# Patient Record
Sex: Female | Born: 1975
Health system: Southern US, Community
[De-identification: ages and names within clinical notes are randomized; demographics above are authoritative.]

## PROBLEM LIST (undated history)

## (undated) DIAGNOSIS — J45909 Unspecified asthma, uncomplicated: Secondary | ICD-10-CM

## (undated) DIAGNOSIS — G4733 Obstructive sleep apnea (adult) (pediatric): Secondary | ICD-10-CM

## (undated) DIAGNOSIS — I272 Pulmonary hypertension, unspecified: Secondary | ICD-10-CM

## (undated) DIAGNOSIS — N926 Irregular menstruation, unspecified: Secondary | ICD-10-CM

## (undated) DIAGNOSIS — R590 Localized enlarged lymph nodes: Secondary | ICD-10-CM

## (undated) DIAGNOSIS — Z8709 Personal history of other diseases of the respiratory system: Secondary | ICD-10-CM

## (undated) DIAGNOSIS — I82629 Acute embolism and thrombosis of deep veins of unspecified upper extremity: Secondary | ICD-10-CM

## (undated) DIAGNOSIS — K7460 Unspecified cirrhosis of liver: Secondary | ICD-10-CM

## (undated) DIAGNOSIS — I50812 Chronic right heart failure: Secondary | ICD-10-CM

## (undated) DIAGNOSIS — J449 Chronic obstructive pulmonary disease, unspecified: Secondary | ICD-10-CM

## (undated) DIAGNOSIS — E119 Type 2 diabetes mellitus without complications: Secondary | ICD-10-CM

## (undated) DIAGNOSIS — R0602 Shortness of breath: Secondary | ICD-10-CM

## (undated) DIAGNOSIS — F1021 Alcohol dependence, in remission: Secondary | ICD-10-CM

## (undated) DIAGNOSIS — G43909 Migraine, unspecified, not intractable, without status migrainosus: Secondary | ICD-10-CM

## (undated) DIAGNOSIS — K056 Periodontal disease, unspecified: Secondary | ICD-10-CM

## (undated) DIAGNOSIS — J189 Pneumonia, unspecified organism: Secondary | ICD-10-CM

## (undated) DIAGNOSIS — I2699 Other pulmonary embolism without acute cor pulmonale: Secondary | ICD-10-CM

## (undated) DIAGNOSIS — Z9981 Dependence on supplemental oxygen: Secondary | ICD-10-CM

## (undated) HISTORY — DX: Pulmonary hypertension, unspecified: I27.20

## (undated) HISTORY — DX: Alcohol dependence, in remission: F10.21

## (undated) HISTORY — DX: Unspecified cirrhosis of liver: K74.60

## (undated) HISTORY — DX: Localized enlarged lymph nodes: R59.0

---
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
## 2011-01-30 ENCOUNTER — Inpatient Hospital Stay: Payer: Self-pay | Admitting: Internal Medicine

## 2011-06-15 ENCOUNTER — Inpatient Hospital Stay (INDEPENDENT_AMBULATORY_CARE_PROVIDER_SITE_OTHER)
Admission: RE | Admit: 2011-06-15 | Discharge: 2011-06-15 | Disposition: A | Discharge status: Home / Self Care | Payer: Self-pay | Source: Ambulatory Visit | Attending: Emergency Medicine | Admitting: Emergency Medicine

## 2011-06-15 DIAGNOSIS — J449 Chronic obstructive pulmonary disease, unspecified: Secondary | ICD-10-CM

## 2012-01-24 ENCOUNTER — Emergency Department (INDEPENDENT_AMBULATORY_CARE_PROVIDER_SITE_OTHER): Payer: 59

## 2012-01-24 ENCOUNTER — Encounter (HOSPITAL_COMMUNITY): Payer: Self-pay

## 2012-01-24 ENCOUNTER — Emergency Department (INDEPENDENT_AMBULATORY_CARE_PROVIDER_SITE_OTHER)
Admission: EM | Admit: 2012-01-24 | Discharge: 2012-01-24 | Disposition: A | Discharge status: Home / Self Care | Payer: 59 | Source: Home / Self Care | Attending: Emergency Medicine | Admitting: Emergency Medicine

## 2012-01-24 DIAGNOSIS — R609 Edema, unspecified: Secondary | ICD-10-CM

## 2012-01-24 DIAGNOSIS — N390 Urinary tract infection, site not specified: Secondary | ICD-10-CM

## 2012-01-24 HISTORY — DX: Chronic obstructive pulmonary disease, unspecified: J44.9

## 2012-01-24 LAB — POCT URINALYSIS DIP (DEVICE)
Ketones, ur: NEGATIVE mg/dL
Protein, ur: 100 mg/dL — AB
Specific Gravity, Urine: 1.02 (ref 1.005–1.030)
pH: 6.5 (ref 5.0–8.0)

## 2012-01-24 LAB — CBC
HCT: 48.6 % — ABNORMAL HIGH (ref 36.0–46.0)
Hemoglobin: 15.6 g/dL — ABNORMAL HIGH (ref 12.0–15.0)
MCHC: 32.1 g/dL (ref 30.0–36.0)
RBC: 4.84 MIL/uL (ref 3.87–5.11)

## 2012-01-24 LAB — POCT I-STAT, CHEM 8
BUN: 7 mg/dL (ref 6–23)
Creatinine, Ser: 0.8 mg/dL (ref 0.50–1.10)
Glucose, Bld: 100 mg/dL — ABNORMAL HIGH (ref 70–99)
Hemoglobin: 18 g/dL — ABNORMAL HIGH (ref 12.0–15.0)
Potassium: 3.9 mEq/L (ref 3.5–5.1)
Sodium: 140 mEq/L (ref 135–145)

## 2012-01-24 MED ORDER — NITROFURANTOIN MONOHYD MACRO 100 MG PO CAPS: 100.0000 mg | ORAL_CAPSULE | Freq: Two times a day (BID) | ORAL | Status: AC

## 2012-01-24 MED ORDER — FUROSEMIDE 20 MG PO TABS: 20.0000 mg | ORAL_TABLET | Freq: Two times a day (BID) | ORAL | Status: DC

## 2012-01-24 NOTE — ED Provider Notes (Addendum)
History     CSN: [id number]  Arrival date & time 01/24/12  1037   First MD Initiated Contact with Patient 01/24/12 1037      Chief Complaint  Patient presents with  . Generalized Body Aches    (Consider location/radiation/quality/duration/timing/severity/associated sxs/prior treatment) HPI Comments: Patient complains of generalized swelling-states that her face, extremities, abdomen become swollen over the past week. Patient reports spontaneous atraumatic ecchymosis on her abdomen. Also reports increasing shortness of breath, wheezing consistent with previous episodes of COPD, has been using her albuterol, but is now complaining of coughing up "green stuff" in the morning. No nausea, vomiting, fevers. No orthopnea, PND, nocturia. No unintentional weight gain. No abdominal pain. No urinary complaints, hematuria, foamy urine. No history of cirrhosis, liver disease, kidney disease. Patient is not on any long-term steroids. No other recent change in medications.  Patient is a 36 y.o. female presenting with shortness of breath. The history is provided by the patient. No language interpreter was used.  Shortness of Breath  The current episode started more than 1 week ago. The onset was gradual. The problem has been gradually worsening. The symptoms are relieved by nothing. The symptoms are aggravated by nothing. Associated symptoms include shortness of breath and wheezing. Pertinent negatives include no chest pain, no orthopnea and no fever. She has had no prior steroid use.    Past Medical History  Diagnosis Date  . COPD (chronic obstructive pulmonary disease)     Past Surgical History  Procedure Date  . Cardiac surgery     History reviewed. No pertinent family history.  History  Substance Use Topics  . Smoking status: Current Everyday Smoker  . Smokeless tobacco: Not on file  . Alcohol Use:     OB History    Grav Para Term Preterm Abortions TAB SAB Ect Mult Living                    Review of Systems  Constitutional: Positive for fatigue. Negative for fever.  HENT: Positive for facial swelling.   Respiratory: Positive for chest tightness, shortness of breath and wheezing.   Cardiovascular: Positive for leg swelling. Negative for chest pain and orthopnea.  Gastrointestinal: Negative for nausea, vomiting, diarrhea and constipation.  Musculoskeletal: Positive for joint swelling.  Skin: Negative for color change.    Allergies  Review of patient's allergies indicates no known allergies.  Home Medications   Current Outpatient Rx  Name Route Sig Dispense Refill  . ALBUTEROL SULFATE HFA 108 (90 BASE) MCG/ACT IN AERS Inhalation Inhale 2 puffs into the lungs every 6 (six) hours as needed.    Marland Kitchen FLUTICASONE PROPIONATE  HFA 110 MCG/ACT IN AERO Inhalation Inhale 1 puff into the lungs 2 (two) times daily.    Marland Kitchen FLUTICASONE-SALMETEROL 115-21 MCG/ACT IN AERO Inhalation Inhale 2 puffs into the lungs 2 (two) times daily.    . FUROSEMIDE 20 MG PO TABS Oral Take 1 tablet (20 mg total) by mouth 2 (two) times daily. 20 tablet 0  . NITROFURANTOIN MONOHYD MACRO 100 MG PO CAPS Oral Take 1 capsule (100 mg total) by mouth 2 (two) times daily. 10 capsule 0    BP 124/93  Resp 17  SpO2 96%  LMP 01/17/2012  Physical Exam  Nursing note and vitals reviewed. Constitutional: She is oriented to person, place, and time. She appears well-developed and well-nourished.  HENT:  Head: Normocephalic and atraumatic.       Round face, mild periorbital puffiness.  Eyes: Conjunctivae and  EOM are normal.  Neck: Normal range of motion. No JVD present.  Cardiovascular: Normal rate, regular rhythm, normal heart sounds and intact distal pulses.   No murmur heard. Pulmonary/Chest: Effort normal. No respiratory distress. She has wheezes. She has no rales. She exhibits no tenderness.       Wheezing left upper lobe.  Abdominal: Soft. Bowel sounds are normal. She exhibits no distension and no fluid  wave. There is no hepatomegaly. There is no tenderness. There is no rebound, no guarding and no CVA tenderness.       Multiple ecchymosis over anterior abdomen  Musculoskeletal: Normal range of motion. She exhibits edema. She exhibits no tenderness.       Trace edema to knees bilaterally.  Neurological: She is alert and oriented to person, place, and time.  Skin: Skin is warm and dry.  Psychiatric: She has a normal mood and affect. Her behavior is normal. Judgment and thought content normal.    ED Course  Procedures (including critical care time)  Dg Chest 2 View  01/24/2012  *RADIOLOGY REPORT*  Clinical Data: Diffuse swelling.  CHEST - 2 VIEW  Comparison: None.  Findings: Trachea is midline.  Heart size normal.  Mild basilar predominant interstitial prominence.  No pleural fluid.  IMPRESSION: Mild basilar predominant interstitial prominence is indicative of edema.  Original Report Authenticated By: Reyes Ivan, M.D.     1. Edema   2. UTI (lower urinary tract infection)    Results for orders placed during the hospital encounter of 01/24/12  CBC      Component Value Range   WBC 9.8  4.0 - 10.5 (K/uL)   RBC 4.84  3.87 - 5.11 (MIL/uL)   Hemoglobin 15.6 (*) 12.0 - 15.0 (g/dL)   HCT 16.1 (*) 09.6 - 46.0 (%)   MCV 100.4 (*) 78.0 - 100.0 (fL)   MCH 32.2  26.0 - 34.0 (pg)   MCHC 32.1  30.0 - 36.0 (g/dL)   RDW 04.5  40.9 - 81.1 (%)   Platelets 198  150 - 400 (K/uL)  POCT I-STAT, CHEM 8      Component Value Range   Sodium 140  135 - 145 (mEq/L)   Potassium 3.9  3.5 - 5.1 (mEq/L)   Chloride 95 (*) 96 - 112 (mEq/L)   BUN 7  6 - 23 (mg/dL)   Creatinine, Ser 9.14  0.50 - 1.10 (mg/dL)   Glucose, Bld 782 (*) 70 - 99 (mg/dL)   Calcium, Ion 9.56  2.13 - 1.32 (mmol/L)   TCO2 35  0 - 100 (mmol/L)   Hemoglobin 18.0 (*) 12.0 - 15.0 (g/dL)   HCT 08.6 (*) 57.8 - 46.0 (%)  POCT URINALYSIS DIP (DEVICE)      Component Value Range   Glucose, UA NEGATIVE  NEGATIVE (mg/dL)   Bilirubin Urine  SMALL (*) NEGATIVE    Ketones, ur NEGATIVE  NEGATIVE (mg/dL)   Specific Gravity, Urine 1.020  1.005 - 1.030    Hgb urine dipstick SMALL (*) NEGATIVE    pH 6.5  5.0 - 8.0    Protein, ur 100 (*) NEGATIVE (mg/dL)   Urobilinogen, UA 1.0  0.0 - 1.0 (mg/dL)   Nitrite POSITIVE (*) NEGATIVE    Leukocytes, UA NEGATIVE  NEGATIVE   POCT PREGNANCY, URINE      Component Value Range   Preg Test, Ur NEGATIVE  NEGATIVE    Dg Chest 2 View  01/24/2012  *RADIOLOGY REPORT*  Clinical Data: Diffuse swelling.  CHEST -  2 VIEW  Comparison: None.  Findings: Trachea is midline.  Heart size normal.  Mild basilar predominant interstitial prominence.  No pleural fluid.  IMPRESSION: Mild basilar predominant interstitial prominence is indicative of edema.  Original Report Authenticated By: Reyes Ivan, M.D.     Imaging reviewed by myself. Report per radiologist.   MDM  Labs, imaging reviewed. Patient has proteinuria, nitrates in urine. Patient has no vaginal complaints. No anemia, or thrombocytopenia suggesting hemolytic process. Will send her home with low-dose Lasix for her symptoms, and have her followup with a primary care physician for further evaluation. Sending her home with a copy of her labs. No evidence of hepatitis at this time. Discussed all results and plan with patient. Patient agrees with plan.  Luiz Blare, MD 01/24/12 1712  Luiz Blare, MD 01/24/12 [phone]

## 2012-01-24 NOTE — ED Notes (Addendum)
C/o has been having a problem w her breathing for past week or so, accompanied by generalized swelling ; NAD at present; states the pro-aire MDI works better that the albuterol (pro air not covered by her insurance)    C/o she is sore in chest  From coughing, and has green sputum in AM

## 2012-01-24 NOTE — Discharge Instructions (Signed)
Decrease her salt intake. This will help with the generalized swelling. He was need to followup with a physician in the next week to determine the cause of your edema. As you get some of the fluid out of your lungs, you should start feeling better. Continue your albuterol inhaler every 4-6 hours as needed for coughing, wheezing, shortness of breath.  Edema Edema is an abnormal build-up of fluids in tissues. Because this is partly dependent on gravity (water flows to the lowest place), it is more common in the leg sand thighs (lower extremities). It is also common in the looser tissues, like around the eyes. Painless swelling of the feet and ankles is common and increases as a person ages. It may affect both legs and may include the calves or even thighs. When squeezed, the fluid may move out of the affected area and may leave a dent for a few moments. CAUSES   Prolonged standing or sitting in one place for extended periods of time. Movement helps pump tissue fluid into the veins, and absence of movement prevents this, resulting in edema.   Varicose veins. The valves in the veins do not work as well as they should. This causes fluid to leak into the tissues.   Fluid and salt overload.   Injury, burn, or surgery to the leg, ankle, or foot, may damage veins and allow fluid to leak out.   Sunburn damages vessels. Leaky vessels allow fluid to go out into the sunburned tissues.   Allergies (from insect bites or stings, medications or chemicals) cause swelling by allowing vessels to become leaky.   Protein in the blood helps keep fluid in your vessels. Low protein, as in malnutrition, allows fluid to leak out.   Hormonal changes, including pregnancy and menstruation, cause fluid retention. This fluid may leak out of vessels and cause edema.   Medications that cause fluid retention. Examples are sex hormones, blood pressure medications, steroid treatment, or anti-depressants.   Some illnesses cause  edema, especially heart failure, kidney disease, or liver disease.   Surgery that cuts veins or lymph nodes, such as surgery done for the heart or for breast cancer, may result in edema.  DIAGNOSIS  Your caregiver is usually easily able to determine what is causing your swelling (edema) by simply asking what is wrong (getting a history) and examining you (doing a physical). Sometimes x-rays, EKG (electrocardiogram or heart tracing), and blood work may be done to evaluate for underlying medical illness. TREATMENT  General treatment includes:  Leg elevation (or elevation of the affected body part).   Restriction of fluid intake.   Prevention of fluid overload.   Compression of the affected body part. Compression with elastic bandages or support stockings squeezes the tissues, preventing fluid from entering and forcing it back into the blood vessels.   Diuretics (also called water pills or fluid pills) pull fluid out of your body in the form of increased urination. These are effective in reducing the swelling, but can have side effects and must be used only under your caregiver's supervision. Diuretics are appropriate only for some types of edema.  The specific treatment can be directed at any underlying causes discovered. Heart, liver, or kidney disease should be treated appropriately. HOME CARE INSTRUCTIONS   Elevate the legs (or affected body part) above the level of the heart, while lying down.   Avoid sitting or standing still for prolonged periods of time.   Avoid putting anything directly under the knees when lying  down, and do not wear constricting clothing or garters on the upper legs.   Exercising the legs causes the fluid to work back into the veins and lymphatic channels. This may help the swelling go down.   The pressure applied by elastic bandages or support stockings can help reduce ankle swelling.   A low-salt diet may help reduce fluid retention and decrease the ankle  swelling.   Take any medications exactly as prescribed.  SEEK MEDICAL CARE IF:  Your edema is not responding to recommended treatments. SEEK IMMEDIATE MEDICAL CARE IF:   You develop shortness of breath or chest pain.   You cannot breathe when you lay down; or if, while lying down, you have to get up and go to the window to get your breath.   You are having increasing swelling without relief from treatment.   You develop a fever over 102 F (38.9 C).   You develop pain or redness in the areas that are swollen.   Tell your caregiver right away if you have gained 3 lb/1.4 kg in 1 day or 5 lb/2.3 kg in a week.  MAKE SURE YOU:   Understand these instructions.   Will watch your condition.   Will get help right away if you are not doing well or get worse.  Document Released: 11/22/2005 Document Revised: 08/04/2011 Document Reviewed: 07/10/2008 [facility] Patient Information 2012 [place], [place].

## 2012-01-26 ENCOUNTER — Ambulatory Visit (INDEPENDENT_AMBULATORY_CARE_PROVIDER_SITE_OTHER): Payer: 59 | Admitting: Family Medicine

## 2012-01-26 VITALS — BP 132/87 | HR 93 | Temp 98.1°F | Resp 16 | Ht 62.0 in | Wt 182.0 lb

## 2012-01-26 DIAGNOSIS — J4489 Other specified chronic obstructive pulmonary disease: Secondary | ICD-10-CM

## 2012-01-26 DIAGNOSIS — R0789 Other chest pain: Secondary | ICD-10-CM

## 2012-01-26 DIAGNOSIS — J449 Chronic obstructive pulmonary disease, unspecified: Secondary | ICD-10-CM

## 2012-01-26 DIAGNOSIS — R609 Edema, unspecified: Secondary | ICD-10-CM

## 2012-01-26 MED ORDER — IBUPROFEN 600 MG PO TABS: 600.0000 mg | ORAL_TABLET | Freq: Three times a day (TID) | ORAL | Status: AC | PRN

## 2012-01-26 MED ORDER — TORSEMIDE 20 MG PO TABS: 20.0000 mg | ORAL_TABLET | Freq: Every day | ORAL | Status: DC

## 2012-01-26 NOTE — Patient Instructions (Signed)
Come back on Friday noon    Smoking Cessation, Tips for Success YOU CAN QUIT SMOKING If you are ready to quit smoking, congratulations! You have chosen to help yourself be healthier. Cigarettes bring nicotine, tar, carbon monoxide, and other irritants into your body. Your lungs, heart, and blood vessels will be able to work better without these poisons. There are many different ways to quit smoking. Nicotine gum, nicotine patches, a nicotine inhaler, or nicotine nasal spray can help with physical craving. Hypnosis, support groups, and medicines help break the habit of smoking. Here are some tips to help you quit for good.  Throw away all cigarettes.   Clean and remove all ashtrays from your home, work, and car.   On a card, write down your reasons for quitting. Carry the card with you and read it when you get the urge to smoke.   Cleanse your body of nicotine. Drink enough water and fluids to keep your urine clear or pale yellow. Do this after quitting to flush the nicotine from your body.   Learn to predict your moods. Do not let a bad situation be your excuse to have a cigarette. Some situations in your life might tempt you into wanting a cigarette.   Never have "just one" cigarette. It leads to wanting another and another. Remind yourself of your decision to quit.   Change habits associated with smoking. If you smoked while driving or when feeling stressed, try other activities to replace smoking. Stand up when drinking your coffee. Brush your teeth after eating. Sit in a different chair when you read the paper. Avoid alcohol while trying to quit, and try to drink fewer caffeinated beverages. Alcohol and caffeine may urge you to smoke.   Avoid foods and drinks that can trigger a desire to smoke, such as sugary or spicy foods and alcohol.   Ask people who smoke not to smoke around you.   Have something planned to do right after eating or having a cup of coffee. Take a walk or exercise to  perk you up. This will help to keep you from overeating.   Try a relaxation exercise to calm you down and decrease your stress. Remember, you may be tense and nervous for the first 2 weeks after you quit, but this will pass.   Find new activities to keep your hands busy. Play with a pen, coin, or rubber band. Doodle or draw things on paper.   Brush your teeth right after eating. This will help cut down on the craving for the taste of tobacco after meals. You can try mouthwash, too.   Use oral substitutes, such as lemon drops, carrots, a cinnamon stick, or chewing gum, in place of cigarettes. Keep them handy so they are available when you have the urge to smoke.   When you have the urge to smoke, try deep breathing.   Designate your home as a nonsmoking area.   If you are a heavy smoker, ask your caregiver about a prescription for nicotine chewing gum. It can ease your withdrawal from nicotine.   Reward yourself. Set aside the cigarette money you save and buy yourself something nice.   Look for support from others. Join a support group or smoking cessation program. Ask someone at home or at work to help you with your plan to quit smoking.   Always ask yourself, "Do I need this cigarette or is this just a reflex?" Tell yourself, "Today, I choose not to smoke," or "  I do not want to smoke." You are reminding yourself of your decision to quit, even if you do smoke a cigarette.  HOW WILL I FEEL WHEN I QUIT SMOKING?  The benefits of not smoking start within days of quitting.   You may have symptoms of withdrawal because your body is used to nicotine (the addictive substance in cigarettes). You may crave cigarettes, be irritable, feel very hungry, cough often, get headaches, or have difficulty concentrating.   The withdrawal symptoms are only temporary. They are strongest when you first quit but will go away within 10 to 14 days.   When withdrawal symptoms occur, stay in control. Think about  your reasons for quitting. Remind yourself that these are signs that your body is healing and getting used to being without cigarettes.   Remember that withdrawal symptoms are easier to treat than the major diseases that smoking can cause.   Even after the withdrawal is over, expect periodic urges to smoke. However, these cravings are generally short-lived and will go away whether you smoke or not. Do not smoke!   If you relapse and smoke again, do not lose hope. Most smokers quit 3 times before they are successful.   If you relapse, do not give up! Plan ahead and think about what you will do the next time you get the urge to smoke.  LIFE AS A NONSMOKER: MAKE IT FOR A MONTH, MAKE IT FOR LIFE Day 1: Hang this page where you will see it every day. Day 2: Get rid of all ashtrays, matches, and lighters. Day 3: Drink water. Breathe deeply between sips. Day 4: Avoid places with smoke-filled air, such as bars, clubs, or the smoking section of restaurants. Day 5: Keep track of how much money you save by not smoking. Day 6: Avoid boredom. Keep a good book with you or go to the movies. Day 7: Reward yourself! One week without smoking! Day 8: Make a dental appointment to get your teeth cleaned. Day 9: Decide how you will turn down a cigarette before it is offered to you. Day 10: Review your reasons for quitting. Day 11: Distract yourself. Stay active to keep your mind off smoking and to relieve tension. Take a walk, exercise, read a book, do a crossword puzzle, or try a new hobby. Day 12: Exercise. Get off the bus before your stop or use stairs instead of escalators. Day 13: Call on friends for support and encouragement. Day 14: Reward yourself! Two weeks without smoking! Day 15: Practice deep breathing exercises. Day 16: Bet a friend that you can stay a nonsmoker. Day 17: Ask to sit in nonsmoking sections of restaurants. Day 18: Hang up "No Smoking" signs. Day 19: Think of yourself as a  nonsmoker. Day 20: Each morning, tell yourself you will not smoke. Day 21: Reward yourself! Three weeks without smoking! Day 22: Think of smoking in negative ways. Remember how it stains your teeth, gives you bad breath, and leaves you short of breath. Day 23: Eat a nutritious breakfast. Day 24:Do not relive your days as a smoker. Day 25: Hold a pencil in your hand when talking on the telephone. Day 26: Tell all your friends you do not smoke. Day 27: Think about how much better food tastes. Day 28: Remember, one cigarette is one too many. Day 29: Take up a hobby that will keep your hands busy. Day 30: Congratulations! One month without smoking! Give yourself a big reward. Your caregiver can direct you  to community resources or hospitals for support, which may include:  Group support.   Education.   Hypnosis.   Subliminal therapy.  Document Released: 08/20/2004 Document Revised: 08/04/2011 Document Reviewed: 09/08/2009 [facility] Patient Information 2012 [place], [place].

## 2012-01-26 NOTE — ED Notes (Signed)
Pt. came into [facility] and asked who did we refer her? D/C instructions printed and given to pt. She asked for directions to [facility]-given.

## 2012-01-26 NOTE — Progress Notes (Signed)
This is a 36 yo woman with almost 40 pack yr hx of cigarettes, known COPD, and recurrent chest tightness and chronic asthma.  She presents with bloating, diffuse edema and chest tightness two days after a visit to [facility] where she was diagnosed with fluid overload and started on Lasix. Pt has a h/o childhood cardiac surgery, unknown type Pt was an alcoholic, dry x 1 year She works with a International aid/development worker.  Obj:  Mildly dyspneic, appears in chronically poor health with dysconjugate gaze and horrible dentition, overweight, and much older than stated age, wheezing  EOM:  Bilateral esophoria Oroph:  Diffuse caries, broken teeth; patent airway Neck:  Thick with no thyromegaly or discernible JVD Chest:  Diminished breath sounds Heart: distant, faint gallop Extrem:  Doughy, 1-2+ edema, ashen Skin:  Ecchymosis each upper quadrant of abdomen, abrasion RUQ from dog scratch  EKG: consistent with RVH and cor pulmonale  I told patient that continued smoking was suicidal.    A:  COPD with probable cor pulmonale and possible OSA.  She needs a careful pulmonary and cardiac workup and obviously needs to quit smoking.  Her teeth need fixing to prevent chronic aspiration or detritus.  P:  Referrals to cardiology and pulmonary Referral to dentistry Check TSH Medrol dospak Demadex

## 2012-01-27 ENCOUNTER — Telehealth: Payer: Self-pay

## 2012-01-27 LAB — TSH: TSH: 2.371 u[IU]/mL (ref 0.350–4.500)

## 2012-01-27 LAB — IRON AND TIBC
%SAT: 10 % — ABNORMAL LOW (ref 20–55)
Iron: 40 ug/dL — ABNORMAL LOW (ref 42–145)
TIBC: 412 ug/dL (ref 250–470)
UIBC: 372 ug/dL (ref 125–400)

## 2012-01-27 LAB — VITAMIN B12: Vitamin B-12: 560 pg/mL (ref 211–911)

## 2012-01-27 NOTE — Telephone Encounter (Signed)
Patient states clinical nurse contacted her and left message to call office. Please call patient

## 2012-01-28 ENCOUNTER — Ambulatory Visit (INDEPENDENT_AMBULATORY_CARE_PROVIDER_SITE_OTHER): Payer: 59 | Admitting: Family Medicine

## 2012-01-28 VITALS — BP 118/83 | HR 94 | Temp 97.9°F | Resp 16 | Ht 62.0 in | Wt 179.0 lb

## 2012-01-28 DIAGNOSIS — R609 Edema, unspecified: Secondary | ICD-10-CM

## 2012-01-28 DIAGNOSIS — J449 Chronic obstructive pulmonary disease, unspecified: Secondary | ICD-10-CM

## 2012-01-28 DIAGNOSIS — J40 Bronchitis, not specified as acute or chronic: Secondary | ICD-10-CM

## 2012-01-28 DIAGNOSIS — K029 Dental caries, unspecified: Secondary | ICD-10-CM | POA: Insufficient documentation

## 2012-01-28 DIAGNOSIS — Q249 Congenital malformation of heart, unspecified: Secondary | ICD-10-CM | POA: Insufficient documentation

## 2012-01-28 DIAGNOSIS — E669 Obesity, unspecified: Secondary | ICD-10-CM | POA: Insufficient documentation

## 2012-01-28 MED ORDER — MOMETASONE FURO-FORMOTEROL FUM 200-5 MCG/ACT IN AERO: 2.0000 | INHALATION_SPRAY | Freq: Two times a day (BID) | RESPIRATORY_TRACT | Status: DC

## 2012-01-28 NOTE — Progress Notes (Signed)
This is a 36 yo woman with almost 40 pack yr hx of cigarettes, known COPD, and recurrent chest tightness and chronic asthma. She is feeling less bloated having lost 4 pounds.  She is coughing green phlegm. No cigarettes in 16 hours. Pt has a h/o childhood cardiac surgery, unknown type  Pt was an alcoholic, dry x 1 year  She works with a International aid/development worker.  Obj: Mildly dyspneic, appears in chronically poor health with dysconjugate gaze and horrible dentition, overweight, and much older than stated age, wheezing  EOM: Bilateral esophoria  Oroph: Diffuse caries, broken teeth; patent airway  Neck: Thick with no thyromegaly or discernible JVD  Chest: Diminished breath sounds, wheezes Heart: distant, faint gallop  Extrem: Doughy, 1-2+ edema, ashen  Skin: Ecchymosis each upper quadrant of abdomen, abrasion RUQ from dog scratch   Results for orders placed in visit on 01/26/12  TSH      Component Value Range   TSH 2.371  0.350 - 4.500 (uIU/mL)  VITAMIN B12      Component Value Range   Vitamin B-12 560  211 - 911 (pg/mL)  IRON AND TIBC      Component Value Range   Iron 40 (*) 42 - 145 (ug/dL)   UIBC 841  324 - 401 (ug/dL)   TIBC 027  253 - 664 (ug/dL)   %SAT 10 (*) 20 - 55 (%)   A: COPD with probable cor pulmonale and possible OSA. She needs a careful pulmonary and cardiac workup and obviously needs to quit smoking. Her teeth need fixing to prevent chronic aspiration or detritus.   Low iron P: Referrals to cardiology and pulmonary pending Referral to dentistry pending Dr. Lawrence Marseilles card Iron qd Dulera 200 bid Demadex qod now Recheck 3 wks

## 2012-01-29 NOTE — Telephone Encounter (Signed)
Pt was seen in our office yesterday. Phone msg was taken 2 days ago

## 2012-02-16 ENCOUNTER — Institutional Professional Consult (permissible substitution): Payer: 59 | Admitting: Internal Medicine

## 2012-02-21 ENCOUNTER — Encounter: Payer: Self-pay | Admitting: *Deleted

## 2012-02-22 ENCOUNTER — Ambulatory Visit (INDEPENDENT_AMBULATORY_CARE_PROVIDER_SITE_OTHER): Payer: 59 | Admitting: Cardiology

## 2012-02-22 ENCOUNTER — Encounter: Payer: Self-pay | Admitting: Cardiology

## 2012-02-22 VITALS — BP 114/83 | HR 110 | Ht 62.0 in | Wt 187.0 lb

## 2012-02-22 DIAGNOSIS — Z720 Tobacco use: Secondary | ICD-10-CM | POA: Insufficient documentation

## 2012-02-22 DIAGNOSIS — J4489 Other specified chronic obstructive pulmonary disease: Secondary | ICD-10-CM

## 2012-02-22 DIAGNOSIS — R188 Other ascites: Secondary | ICD-10-CM

## 2012-02-22 DIAGNOSIS — J449 Chronic obstructive pulmonary disease, unspecified: Secondary | ICD-10-CM

## 2012-02-22 DIAGNOSIS — F17200 Nicotine dependence, unspecified, uncomplicated: Secondary | ICD-10-CM

## 2012-02-22 DIAGNOSIS — R0600 Dyspnea, unspecified: Secondary | ICD-10-CM | POA: Insufficient documentation

## 2012-02-22 DIAGNOSIS — R0602 Shortness of breath: Secondary | ICD-10-CM

## 2012-02-22 LAB — CBC WITH DIFFERENTIAL/PLATELET
Basophils Relative: 0.2 % (ref 0.0–3.0)
Eosinophils Absolute: 0.1 10*3/uL (ref 0.0–0.7)
HCT: 51.1 % — ABNORMAL HIGH (ref 36.0–46.0)
Hemoglobin: 16.5 g/dL — ABNORMAL HIGH (ref 12.0–15.0)
Lymphocytes Relative: 14.5 % (ref 12.0–46.0)
Lymphs Abs: 1.5 10*3/uL (ref 0.7–4.0)
MCHC: 32.3 g/dL (ref 30.0–36.0)
Neutro Abs: 7.7 10*3/uL (ref 1.4–7.7)
RBC: 5.28 Mil/uL — ABNORMAL HIGH (ref 3.87–5.11)

## 2012-02-22 LAB — BASIC METABOLIC PANEL
CO2: 39 mEq/L — ABNORMAL HIGH (ref 19–32)
Calcium: 8.6 mg/dL (ref 8.4–10.5)
Creatinine, Ser: 0.9 mg/dL (ref 0.4–1.2)
GFR: 74.11 mL/min (ref >60.00)
Sodium: 138 mEq/L (ref 135–145)

## 2012-02-22 LAB — HEPATIC FUNCTION PANEL
Alkaline Phosphatase: 79 U/L (ref 39–117)
Bilirubin, Direct: 0.2 mg/dL (ref 0.0–0.3)
Total Bilirubin: 1.2 mg/dL (ref 0.3–1.2)
Total Protein: 6.2 g/dL (ref 6.0–8.3)

## 2012-02-22 NOTE — Patient Instructions (Signed)
Your physician recommends that you schedule a follow-up appointment in: 4 WEEKS  Your physician has requested that you have an echocardiogram. Echocardiography is a painless test that uses sound waves to create images of your heart. It provides your doctor with information about the size and shape of your heart and how well your heart's chambers and valves are working. This procedure takes approximately one hour. There are no restrictions for this procedure.   ABDOMINAL ULTRASOUND FOR ASCITES   Your physician recommends that you return for lab work in: TODAY

## 2012-02-22 NOTE — Assessment & Plan Note (Signed)
Patient counseled on discontinuing. 

## 2012-02-22 NOTE — Progress Notes (Signed)
  HPI: A 36 year old female for evaluation of dyspnea and edema. The patient states she had heart and lung surgery as a child. This was done in [place]. None of those records are available. She does not think she had congenital heart disease. Over the past 5 weeks she has noticed progressive edema in her abdomen and lower extremities predominantly. There is question orthopnea but no PND. No chest pain or syncope. Note she does have a history of COPD. She also has a history of alcoholism. Chest x-ray in February 2013 showed mild basilar and residual prominence suggestive of edema. TSH in February of 2013 normal. Renal function normal. Hemoglobin 18. Because of her edema and dyspnea we were asked to evaluate.  Current Outpatient Prescriptions  Medication Sig Dispense Refill  . albuterol (PROVENTIL HFA;VENTOLIN HFA) 108 (90 BASE) MCG/ACT inhaler Inhale 2 puffs into the lungs every 6 (six) hours as needed.      . fluticasone (FLOVENT HFA) 110 MCG/ACT inhaler Inhale 1 puff into the lungs 2 (two) times daily.      Marland Kitchen ibuprofen (ADVIL,MOTRIN) 600 MG tablet prn      . Mometasone Furo-Formoterol Fum 200-5 MCG/ACT AERO Inhale 2 puffs into the lungs 2 (two) times daily.  1 Inhaler  11  . torsemide (DEMADEX) 20 MG tablet Take 1 tablet (20 mg total) by mouth daily.  30 tablet  1    No Known Allergies  Past Medical History  Diagnosis Date  . COPD (chronic obstructive pulmonary disease)   . Chronic asthma   . History of alcoholism     Past Surgical History  Procedure Date  . Cardiac surgery   . Lung surgery     History   Social History  . Marital Status: Married    Spouse Name: N/A    Number of Children: N/A  . Years of Education: N/A   Occupational History  .      [employer]   Social History Main Topics  . Smoking status: Current Everyday Smoker  . Smokeless tobacco: Not on file  . Alcohol Use: Yes     One drink per week  . Drug Use: No  . Sexually Active: Not on file   Other Topics  Concern  . Not on file   Social History Narrative  . No narrative on file    Family History  Problem Relation Age of Onset  . Hypertension Father   . Heart disease Father     CHF    ROS: Recent dark vaginal discharge but no fevers or chills, productive cough, hemoptysis, dysphasia, odynophagia, melena, hematochezia, dysuria, hematuria, rash, seizure activity, claudication. Remaining systems are negative.  Physical Exam:   Blood pressure 114/83, pulse 110, height 5\' 2"  (1.575 m), weight 187 lb (84.823 kg), last menstrual period 01/17/2012.  General:  Well developed/well nourished in NAD Skin warm/dry; tattoo left shoulder Patient not depressed No peripheral clubbing Back-normal HEENT-strabismus/normal eyelids; periorbital edema Neck supple/normal carotid upstroke bilaterally; no bruits;  no thyromegaly chest - CTA/ normal expansion; previous thoracic surgery CV - RRR/normal S1 and S2; no murmurs, rubs or gallops;  PMI nondisplaced Abdomen -patient with mild tenderness predominately in the lower abdomen. She has distention and probable ascites. I cannot palpate her liver. No masses appreciated. There is edema in her abdominal wall. 2+ femoral pulses, no bruits Ext-2+ edema, no chords, 2+ DP Neuro-grossly nonfocal  ECG 02/10/2012-sinus rhythm, right axis deviation, poor R-wave progression, nonspecific T wave changes.

## 2012-02-22 NOTE — Assessment & Plan Note (Signed)
Patient has new onset ascites and lower extremity edema. She also has worsening dyspnea on exertion. She is volume overloaded on examination. I have asked her to take her Demadex daily. Etiology of present situation unclear. Most likely related to lung disease with cor pulmonale. Will arrange echocardiogram to assess LV and RV function. Note hemoglobin elevated which would be consistent with above. She also has question prior heart surgery as a child. I do not have the records available and she is unaware of the details. Echocardiogram will help exclude congenital heart disease. Finally she has a history of alcoholism. This has resolved and I think less likely is cirrhosis as the cause of her presentation. She clearly has ascites on examination. Will arrange echocardiogram to assess liver. Check liver functions. Patient will also followup with pulmonary for full evaluation of her lung disease.

## 2012-02-22 NOTE — Assessment & Plan Note (Signed)
Management per pulmonary. I will also check today potassium, renal function, BNP and CBC.

## 2012-02-23 ENCOUNTER — Telehealth: Payer: Self-pay | Admitting: Cardiology

## 2012-02-23 NOTE — Telephone Encounter (Signed)
Spoke with pt, aware of labs 

## 2012-02-23 NOTE — Telephone Encounter (Signed)
FU Call: Pt returning call to [facility]. Please return pt call to discuss further.

## 2012-02-28 ENCOUNTER — Ambulatory Visit
Admission: RE | Admit: 2012-02-28 | Discharge: 2012-02-28 | Disposition: A | Discharge status: Home / Self Care | Payer: 59 | Source: Ambulatory Visit | Attending: Family Medicine | Admitting: Family Medicine

## 2012-02-28 ENCOUNTER — Telehealth: Payer: Self-pay

## 2012-02-28 ENCOUNTER — Encounter: Payer: Self-pay | Admitting: Family Medicine

## 2012-02-28 ENCOUNTER — Ambulatory Visit (INDEPENDENT_AMBULATORY_CARE_PROVIDER_SITE_OTHER): Payer: 59 | Admitting: Family Medicine

## 2012-02-28 VITALS — BP 105/71 | HR 98 | Temp 98.2°F | Resp 16 | Ht 62.0 in | Wt 190.0 lb

## 2012-02-28 DIAGNOSIS — R109 Unspecified abdominal pain: Secondary | ICD-10-CM

## 2012-02-28 DIAGNOSIS — R1084 Generalized abdominal pain: Secondary | ICD-10-CM

## 2012-02-28 DIAGNOSIS — R1013 Epigastric pain: Secondary | ICD-10-CM

## 2012-02-28 NOTE — Progress Notes (Signed)
This is a 36 yo woman with almost 40 pack yr hx of cigarettes, known COPD, and recurrent chest tightness and chronic asthma. She is feeling less bloated having lost 4 pounds. She is coughing green phlegm. No cigarettes in 16 hours.  Pt has a h/o childhood cardiac surgery, unknown type  Pt was an alcoholic, dry x 1 year  She works with a International aid/development worker.   She has given up smoking and is taking the Demadex daily but is not voiding much.  Her abdomen is swelling and she is bloating.  Her legs are swelling and she is progressively more uncomfortable.  HEENT:  Poor dentition, otherwise plethoric.  Unremarkable otherwise Chest:  Clear Heart:  Reg, no murmur Abd:  Distended, tender lower area.  Firm masses in epigastrium Ext:  2 + lower extremity edema bilaterally  A:  I am concerned with what seems like either ascites or bladder distension.  She needs an ultrasound today.  P:  Stat U/S to rule out ascites or urinary retention.  Also to rule out liver mass. If these are not present, we may have to increase the Demadex and reduce sale\t

## 2012-02-28 NOTE — Telephone Encounter (Signed)
US reveals no liver mass or urinary retention.  Mild acites.  Dr. Milus Glazier notified by phone.  He advises increase in Demadex dose and follow- up with him in 2-3 days. Per Porfirio Oar, PA-C, advised patient to take an extra Demadex each day (take 2 20 mg), and return to see Dr. Milus Glazier in 2-3 days.  Dr. Milus Glazier said he'd be in the office tomorrow to review the report.

## 2012-02-28 NOTE — Telephone Encounter (Signed)
PT WOULD LIKE A CALL BACK TODAY IN REGARDS TO HER SCAN RESULTS SHE IS VERY CONCERNED

## 2012-03-01 ENCOUNTER — Telehealth: Payer: Self-pay | Admitting: Radiology

## 2012-03-01 NOTE — Telephone Encounter (Signed)
The abnormalities in the lab involve a very very slight liver abnormality that is consistent with fatty liver.  The electrolyte abnormalities are not significant.  In short, these are not thought to be contributing to her recent fluid retention.

## 2012-03-01 NOTE — Telephone Encounter (Signed)
Message copied by Levon Hedger A on Wed Mar 01, 2012  3:12 PM ------      Message from: POE, Dinah Beers C      Created: Wed Mar 01, 2012 10:02 AM                   ----- Message -----         From: Elvina Sidle, MD         Sent: 02/29/2012  10:01 AM           To: [facility] Lab Pool            Patient has abnormal lab values.  Patient needs to double her Demadex and really reduce the fat in her diet.  I would like her to follow up in one week.

## 2012-03-02 NOTE — Telephone Encounter (Signed)
Explained to pt the abnormalities in labs and went over instr's with pt. Pt agreed

## 2012-03-06 ENCOUNTER — Inpatient Hospital Stay (HOSPITAL_COMMUNITY)
Admission: EM | Admit: 2012-03-06 | Discharge: 2012-03-11 | DRG: 176 | Disposition: A | Discharge status: Home Health | Payer: 59 | Attending: Internal Medicine | Admitting: Internal Medicine

## 2012-03-06 ENCOUNTER — Encounter (HOSPITAL_COMMUNITY): Payer: Self-pay | Admitting: Emergency Medicine

## 2012-03-06 ENCOUNTER — Emergency Department (HOSPITAL_COMMUNITY): Payer: 59

## 2012-03-06 DIAGNOSIS — M79609 Pain in unspecified limb: Secondary | ICD-10-CM

## 2012-03-06 DIAGNOSIS — R599 Enlarged lymph nodes, unspecified: Secondary | ICD-10-CM | POA: Diagnosis present

## 2012-03-06 DIAGNOSIS — I82629 Acute embolism and thrombosis of deep veins of unspecified upper extremity: Secondary | ICD-10-CM | POA: Diagnosis present

## 2012-03-06 DIAGNOSIS — G4733 Obstructive sleep apnea (adult) (pediatric): Secondary | ICD-10-CM | POA: Diagnosis present

## 2012-03-06 DIAGNOSIS — R600 Localized edema: Secondary | ICD-10-CM | POA: Diagnosis present

## 2012-03-06 DIAGNOSIS — F17200 Nicotine dependence, unspecified, uncomplicated: Secondary | ICD-10-CM | POA: Diagnosis present

## 2012-03-06 DIAGNOSIS — I82B19 Acute embolism and thrombosis of unspecified subclavian vein: Secondary | ICD-10-CM

## 2012-03-06 DIAGNOSIS — J449 Chronic obstructive pulmonary disease, unspecified: Secondary | ICD-10-CM | POA: Diagnosis present

## 2012-03-06 DIAGNOSIS — R590 Localized enlarged lymph nodes: Secondary | ICD-10-CM | POA: Diagnosis present

## 2012-03-06 DIAGNOSIS — F1010 Alcohol abuse, uncomplicated: Secondary | ICD-10-CM | POA: Diagnosis present

## 2012-03-06 DIAGNOSIS — Z9981 Dependence on supplemental oxygen: Secondary | ICD-10-CM

## 2012-03-06 DIAGNOSIS — I2789 Other specified pulmonary heart diseases: Secondary | ICD-10-CM | POA: Diagnosis present

## 2012-03-06 DIAGNOSIS — K7460 Unspecified cirrhosis of liver: Secondary | ICD-10-CM | POA: Diagnosis present

## 2012-03-06 DIAGNOSIS — I82409 Acute embolism and thrombosis of unspecified deep veins of unspecified lower extremity: Secondary | ICD-10-CM | POA: Diagnosis present

## 2012-03-06 DIAGNOSIS — R140 Abdominal distension (gaseous): Secondary | ICD-10-CM | POA: Diagnosis present

## 2012-03-06 DIAGNOSIS — J90 Pleural effusion, not elsewhere classified: Secondary | ICD-10-CM | POA: Diagnosis present

## 2012-03-06 DIAGNOSIS — E662 Morbid (severe) obesity with alveolar hypoventilation: Secondary | ICD-10-CM | POA: Diagnosis present

## 2012-03-06 DIAGNOSIS — M7989 Other specified soft tissue disorders: Secondary | ICD-10-CM

## 2012-03-06 DIAGNOSIS — R0902 Hypoxemia: Secondary | ICD-10-CM | POA: Diagnosis not present

## 2012-03-06 DIAGNOSIS — R609 Edema, unspecified: Secondary | ICD-10-CM

## 2012-03-06 DIAGNOSIS — Z6836 Body mass index (BMI) 36.0-36.9, adult: Secondary | ICD-10-CM

## 2012-03-06 DIAGNOSIS — R0602 Shortness of breath: Secondary | ICD-10-CM

## 2012-03-06 DIAGNOSIS — I509 Heart failure, unspecified: Secondary | ICD-10-CM | POA: Diagnosis present

## 2012-03-06 DIAGNOSIS — J4489 Other specified chronic obstructive pulmonary disease: Secondary | ICD-10-CM | POA: Diagnosis present

## 2012-03-06 DIAGNOSIS — T502X5A Adverse effect of carbonic-anhydrase inhibitors, benzothiadiazides and other diuretics, initial encounter: Secondary | ICD-10-CM | POA: Diagnosis present

## 2012-03-06 DIAGNOSIS — E873 Alkalosis: Secondary | ICD-10-CM | POA: Diagnosis present

## 2012-03-06 DIAGNOSIS — Z7901 Long term (current) use of anticoagulants: Secondary | ICD-10-CM

## 2012-03-06 DIAGNOSIS — I272 Pulmonary hypertension, unspecified: Secondary | ICD-10-CM

## 2012-03-06 DIAGNOSIS — R188 Other ascites: Secondary | ICD-10-CM | POA: Diagnosis present

## 2012-03-06 DIAGNOSIS — Z79899 Other long term (current) drug therapy: Secondary | ICD-10-CM

## 2012-03-06 DIAGNOSIS — E876 Hypokalemia: Secondary | ICD-10-CM | POA: Diagnosis present

## 2012-03-06 DIAGNOSIS — R0600 Dyspnea, unspecified: Secondary | ICD-10-CM

## 2012-03-06 DIAGNOSIS — I2699 Other pulmonary embolism without acute cor pulmonale: Secondary | ICD-10-CM

## 2012-03-06 HISTORY — PX: CARDIAC CATHETERIZATION: SHX172

## 2012-03-06 HISTORY — DX: Other pulmonary embolism without acute cor pulmonale: I26.99

## 2012-03-06 HISTORY — DX: Irregular menstruation, unspecified: N92.6

## 2012-03-06 HISTORY — DX: Acute embolism and thrombosis of deep veins of unspecified upper extremity: I82.629

## 2012-03-06 HISTORY — DX: Pulmonary hypertension, unspecified: I27.20

## 2012-03-06 LAB — CBC
Hemoglobin: 16.3 g/dL — ABNORMAL HIGH (ref 12.0–15.0)
MCH: 31 pg (ref 26.0–34.0)
MCHC: 32.3 g/dL (ref 30.0–36.0)
MCV: 95.8 fL (ref 78.0–100.0)
RBC: 5.26 MIL/uL — ABNORMAL HIGH (ref 3.87–5.11)

## 2012-03-06 LAB — URINALYSIS, ROUTINE W REFLEX MICROSCOPIC
Bilirubin Urine: NEGATIVE
Glucose, UA: NEGATIVE mg/dL
Ketones, ur: 15 mg/dL — AB
Leukocytes, UA: NEGATIVE
Protein, ur: NEGATIVE mg/dL
pH: 5.5 (ref 5.0–8.0)

## 2012-03-06 LAB — PROTIME-INR
INR: 1.27 (ref 0.00–1.49)
Prothrombin Time: 16.2 seconds — ABNORMAL HIGH (ref 11.6–15.2)

## 2012-03-06 LAB — MAGNESIUM: Magnesium: 1.7 mg/dL (ref 1.5–2.5)

## 2012-03-06 LAB — HEPATIC FUNCTION PANEL
ALT: 29 U/L (ref 0–35)
Alkaline Phosphatase: 67 U/L (ref 39–117)
Bilirubin, Direct: 0.4 mg/dL — ABNORMAL HIGH (ref 0.0–0.3)
Indirect Bilirubin: 0.7 mg/dL (ref 0.3–0.9)
Total Protein: 5.7 g/dL — ABNORMAL LOW (ref 6.0–8.3)

## 2012-03-06 LAB — DIFFERENTIAL
Basophils Relative: 0 % (ref 0–1)
Eosinophils Relative: 1 % (ref 0–5)
Lymphs Abs: 1.6 10*3/uL (ref 0.7–4.0)
Monocytes Absolute: 2 10*3/uL — ABNORMAL HIGH (ref 0.1–1.0)

## 2012-03-06 LAB — BASIC METABOLIC PANEL
BUN: 41 mg/dL — ABNORMAL HIGH (ref 6–23)
CO2: 38 mEq/L — ABNORMAL HIGH (ref 19–32)
Calcium: 9.1 mg/dL (ref 8.4–10.5)
Creatinine, Ser: 1.08 mg/dL (ref 0.50–1.10)
Glucose, Bld: 91 mg/dL (ref 70–99)

## 2012-03-06 LAB — HEPARIN LEVEL (UNFRACTIONATED): Heparin Unfractionated: 0.31 IU/mL (ref 0.30–0.70)

## 2012-03-06 LAB — APTT: aPTT: 29 seconds (ref 24–37)

## 2012-03-06 MED ORDER — IOHEXOL 350 MG/ML SOLN
100.0000 mL | Freq: Once | INTRAVENOUS | Status: AC | PRN
  Administered 2012-03-06: 100 mL via INTRAVENOUS

## 2012-03-06 MED ORDER — ACETAMINOPHEN 325 MG PO TABS
650.0000 mg | ORAL_TABLET | Freq: Four times a day (QID) | ORAL | Status: DC | PRN
  Administered 2012-03-06 – 2012-03-11 (×3): 650 mg via ORAL
  Filled 2012-03-06 (×3): qty 2

## 2012-03-06 MED ORDER — SODIUM CHLORIDE 0.9 % IJ SOLN: 3.0000 mL | INTRAMUSCULAR | Status: DC | PRN

## 2012-03-06 MED ORDER — HEPARIN BOLUS VIA INFUSION
5000.0000 [IU] | Freq: Once | INTRAVENOUS | Status: AC
  Administered 2012-03-06: 5000 [IU] via INTRAVENOUS

## 2012-03-06 MED ORDER — ACETAMINOPHEN 650 MG RE SUPP: 650.0000 mg | Freq: Four times a day (QID) | RECTAL | Status: DC | PRN

## 2012-03-06 MED ORDER — FLUTICASONE-SALMETEROL 250-50 MCG/DOSE IN AEPB
1.0000 | INHALATION_SPRAY | Freq: Two times a day (BID) | RESPIRATORY_TRACT | Status: DC
  Administered 2012-03-06 – 2012-03-11 (×9): 1 via RESPIRATORY_TRACT
  Filled 2012-03-06: qty 14

## 2012-03-06 MED ORDER — ONDANSETRON HCL 4 MG PO TABS: 4.0000 mg | ORAL_TABLET | Freq: Four times a day (QID) | ORAL | Status: DC | PRN

## 2012-03-06 MED ORDER — SODIUM CHLORIDE 0.9 % IJ SOLN
3.0000 mL | Freq: Two times a day (BID) | INTRAMUSCULAR | Status: DC
  Administered 2012-03-06 – 2012-03-09 (×4): 3 mL via INTRAVENOUS

## 2012-03-06 MED ORDER — ONDANSETRON HCL 4 MG/2ML IJ SOLN: 4.0000 mg | Freq: Four times a day (QID) | INTRAMUSCULAR | Status: DC | PRN

## 2012-03-06 MED ORDER — HEPARIN (PORCINE) IN NACL 100-0.45 UNIT/ML-% IJ SOLN
1400.0000 [IU]/h | INTRAMUSCULAR | Status: DC
  Administered 2012-03-06: 1200 [IU]/h via INTRAVENOUS
  Administered 2012-03-07: 1400 [IU]/h via INTRAVENOUS
  Filled 2012-03-06 (×4): qty 250

## 2012-03-06 MED ORDER — ALBUTEROL SULFATE HFA 108 (90 BASE) MCG/ACT IN AERS
2.0000 | INHALATION_SPRAY | Freq: Four times a day (QID) | RESPIRATORY_TRACT | Status: DC | PRN
  Administered 2012-03-06 – 2012-03-07 (×4): 2 via RESPIRATORY_TRACT
  Filled 2012-03-06: qty 6.7

## 2012-03-06 MED ORDER — ENOXAPARIN SODIUM 40 MG/0.4ML ~~LOC~~ SOLN: 40.0000 mg | SUBCUTANEOUS | Status: DC

## 2012-03-06 MED ORDER — SODIUM CHLORIDE 0.9 % IJ SOLN
3.0000 mL | Freq: Two times a day (BID) | INTRAMUSCULAR | Status: DC
  Administered 2012-03-06 – 2012-03-10 (×7): 3 mL via INTRAVENOUS

## 2012-03-06 NOTE — ED Notes (Signed)
PT. REPORTS WOKE UP THIS MORNING WITH RIGHT FACIAL SWELLING , RIGHT FOREARM SWELLING AND ABDOMINAL/LEGS SWELLING , STATES TAKING DEMADEX FOR EDEMA PRESCRIBED BY PCP. DENIES SOB . OCCASIONAL PRODUCTIVE COUGH . PT. ALSO REPORTS HISTORY OF COPD.

## 2012-03-06 NOTE — ED Notes (Signed)
Patient transported to CT 

## 2012-03-06 NOTE — H&P (Signed)
Hospital Admission Note Date: 03/06/2012  Patient name:  Mary [name]  Medical record number:  [id number] Date of birth:  1976-08-24  Age: 36 y.o. Gender: female PCP:    Elvina Sidle, MD, MD  Medical Service:   Internal Medicine Teaching Service   Attending physician:  Dr. Aundria Rud First Contact:   Dr. Manson Passey    Pager: [phone]  Second Contact:   Dr. Tonny Branch   Pager: [phone] After Hours:    First Contact   Pager: [phone]      Second Contact  Pager: [phone]   Chief Complaint: Right arm, face and breast swelling  History of Present Illness: Patient is a 36 y.o. female with a PMHx of tobacco abuse and asthma, who presents to [facility] for evaluation of right arm, face, and breast swelling. The patient reports she first noticed abdominal tightness in February of this year. Her PCP had attributed this to her diaphragm pushing down as a result of her COPD. The patient began to develop progressive lower extremity edema and abdominal distention. An abdominal ultrasound was done which illustrated ascites. She was started on Lasix, later transitioned to Torsemide with appropriate dose adjustment which did help decrease the amount of edema and distention. The patient was then referred to a cardiologist who recommended echocardiogram. Patient has not had echo done as it was schedule later this month. 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 The patient reports URI illness over the last month, and stomach virus just 2 days prior to admission. She also noticed increasing swelling in her face, arm and right breast, that suddenly worsened overnight. Patient reports feeling short of breath, palpitations, and along with swelling decided she needed further evaluation. Patient was found to have subclavian vein thrombosis evaluated by doppler. She denies chest pain, but does mention having pain where the clot is that is worsened with coughing.   Current Outpatient  Medications: Current Facility-Administered Medications  Medication Dose Route Frequency Provider Last Rate Last Dose  . heparin ADULT infusion 100 units/mL (25000 units/250 mL)  1,200 Units/hr Intravenous Continuous Lyanne Co, MD 12 mL/hr at 03/06/12 0755 1,200 Units/hr at 03/06/12 0755  . heparin bolus via infusion 5,000 Units  5,000 Units Intravenous Once Lyanne Co, MD   5,000 Units at 03/06/12 0756  . iohexol (OMNIPAQUE) 350 MG/ML injection 100 mL  100 mL Intravenous Once PRN Medication Radiologist, MD   100 mL at 03/06/12 5784   Current Outpatient Prescriptions  Medication Sig Dispense Refill  . albuterol (PROVENTIL HFA;VENTOLIN HFA) 108 (90 BASE) MCG/ACT inhaler Inhale 2 puffs into the lungs every 6 (six) hours as needed. For shortness of breath      . Fluticasone-Salmeterol (ADVAIR) 250-50 MCG/DOSE AEPB Inhale 1 puff into the lungs every 12 (twelve) hours.      Marland Kitchen ibuprofen (ADVIL,MOTRIN) 600 MG tablet Take 600 mg by mouth every 6 (six) hours as needed. For pain      . PRESCRIPTION MEDICATION Take 1 tablet by mouth once. Patient states that she took one of her husbands prescribed medication for pain      . torsemide (DEMADEX) 20 MG tablet Take 20 mg by mouth 2 (two) times daily.      Marland Kitchen DISCONTD: furosemide (LASIX) 20 MG tablet Take 1 tablet (20 mg total) by mouth 2 (two) times daily.  20 tablet  0    Allergies: No Known Allergies   Past Medical History: Past Medical History  Diagnosis Date  .  COPD (chronic obstructive pulmonary disease)     no documented PFTs, but patient said it was confirmed  . Chronic asthma   . History of alcoholism     sober last 5 years  . Tobacco abuse   . Irregular menses     Past Surgical History: Past Surgical History  Procedure Date  . Cardiac surgery   . Lung surgery     Family History: Family History  Problem Relation Age of Onset  . Hypertension Father   . Heart disease Father     CHF  . Alcohol abuse Father   Mother -  unknown as she left when patient was a young child  Social History: History   Social History  . Marital Status: Married    Spouse Name: N/A    Number of Children: N/A  . Years of Education: college   Occupational History  .      [employer]   Social History Main Topics  . Smoking status: Current Everyday Smoker -- 1.0 packs/day for 25 years    Types: Cigarettes  . Smokeless tobacco: Not on file   Comment: quit smoking 1 week ago  . Alcohol Use: Yes     One drink per week  . Drug Use: No  . Sexually Active: Yes -- Female partner(s)    Birth Control/ Protection: None   Other Topics Concern  . 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 Well-developed, well-nourished, in no acute distress; alert, appropriate and cooperative throughout examination.  Eyes and Face: PERRL, EOMI, right eyelid inflamed, right sided facial swelling, slight erythema of right side of face  Neck: No deformities, masses, or tenderness noted.Supple, No carotid Bruits, no JVD.  Lungs:  Normal respiratory effort, bibasilar crackles heard, no wheezing  Heart: RRR. S1 and S2 normal without gallop, murmur, or rubs, tachycardic  Abdomen:  BS normoactive. Soft, distended, non-tender.  Unable to palpate spleen, gallbladder due to distention,  striae present   Extremities: Bilateral lower extremity edema 2+ extending to knees   Neurologic: A&O X3, CN II - XII are grossly intact. Motor strength is 5/5 in the all 4 extremities  Skin: Spider veins presents, no rashes or lesions, right breast erythematous when compared to left    Lab results: Basic Metabolic Panel: Recent Labs  Basename 03/06/12 0630   NA 131*   K 3.9   CL 81*   CO2 38*   GLUCOSE 91   BUN 41*   CREATININE 1.08   CALCIUM 9.1   MG --   PHOS --   CBC: Recent Labs  Basename 03/06/12 0630   WBC 13.1*   NEUTROABS 9.4*   HGB 16.3*   HCT 50.4*   MCV  95.8   PLT 192  ANC 9.4 Absolute monocytes 2.0  PT 16.2 INR 1.27 PTT 29   Imaging results:  Dg Chest 2 View  03/06/2012  *RADIOLOGY REPORT*  Clinical Data: Right-sided chest pain.  Swelling.  CHEST - 2 VIEW  Comparison: 01/24/2012.  Findings: Peribronchial thickening may represent changes of chronic bronchitis.  Basilar scarring.  No segmental infiltrate or pneumothorax.  Prominence right hilar region.  This may represent overlapping vascular structures however, adenopathy or mass not excluded.  Heart size top normal.  IMPRESSION: Peribronchial thickening may represent changes of chronic bronchitis.  Prominence right hilar region.  This may represent overlapping vascular structures however, adenopathy or mass not excluded.  Original Report Authenticated By: Fuller Canada, M.D.   Ct Angio Chest W/cm &/or Wo Cm  03/06/2012  **ADDENDUM** CREATED: 03/06/2012 10:02:30  Contrast utilized was 100 ml of Omnipaque 350.  **END ADDENDUM** SIGNED BY: Almedia Balls. Constance Goltz, M.D.    03/06/2012  *RADIOLOGY REPORT*  Clinical Data: Right upper extremity swelling.  Question abnormal chest x-ray.  CT ANGIOGRAPHY CHEST  Technique:  Multidetector CT imaging of the chest using the standard protocol during bolus administration of intravenous contrast. Multiplanar reconstructed images including MIPs were obtained and reviewed to evaluate the  vascular anatomy.  Contrast:  Comparison: 03/06/2012 chest x-ray.  Findings: Filling defect right internal jugular vein/subclavian vein junction just proximal to the formation of the superior vena cava involving the right subclavian vein.  Marked  right chest wall edema involving the right breast and right axilla.  Right axillary adenopathy.  No pulmonary embolus (probable motion artifact involving the right lower lobe pulmonary vessels).  No aortic dissection.  Mediastinal and right hilar adenopathy.  Scattered pulmonary parenchymal changes without dominant worrisome mass.  Patchy parenchymal changes most notable right upper lobe. This can be reevaluated follow-up after acute episode has cleared.  Small right-sided pleural effusion.  Right heart enlargement. Small superiorly located pericardial effusion.  No bony destructive lesion.  Ascites.  Slight lobularity liver without other findings of cirrhosis.  Mild third spacing of fluid abdominal wall.  IMPRESSION: Thrombosed right subclavian vein.  Marked  right chest wall edema involving the right breast and right axilla.  Right axillary adenopathy.  No pulmonary embolus (probable motion artifact involving the right lower lobe pulmonary vessels).  Mediastinal and right hilar adenopathy.  Scattered pulmonary parenchymal changes without dominant worrisome mass.  Patchy parenchymal changes most notable right upper lobe. This can be reevaluated follow-up after acute episode has cleared.  Small right-sided pleural effusion.  Right heart enlargement. Small superiorly located pericardial effusion.  Ascites.  Slight lobularity liver without other findings of cirrhosis.  Mild third spacing of fluid abdominal wall.  Original Report Authenticated By: Fuller Canada, M.D.    Assessment & Plan:  This is a 36 year old woman with history of asthma and progressive lower extremity edema, abdominal distention over the last month, who has been admitted for sudden right sided  facial, arm and breast swelling.   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 Primary subclavian vein thrombosis is rare and if seen, is generally in young athletic individuals who use upper extremities in repetitive manner. Other causes include congenital abnormalities such as supernumery ribs, malignancy, hypercoagulable disorder. Interestingly patient also has right axillary adenopathy which may be reactive vs of malignant potential.   Plan: -Anticoagulation with heparin and transition to warfarin when able and out of surgical window -Consider IR consult for possible thrombolysis  vs surgical decompression -Perform abdominal/Pelvis CT as part of malignancy work-up -Consider hypercoagulable panel, but would need to discuss with hematology if it should be performed in the acute setting while on heparin, or as outpatient  -Supportive care with pain management  2.) Abdominal distention and LE edema - minimal response to diuretic therapy. Exact cause unknown. Patient has history of alcoholism and ascites was present on U/S. Heart failure is on differential given clinical presentation, history of alcoholism that can contribute to dilated cardiomyopathy. Of note, patient's father also had CHF, and was also an alcoholic.   Plan: -Check hepatic function panel -Check TSH -CT abd/pelvis in am  -Echo -Pro-BNP  3.) Shortness of breath - possibly related to 1 and 2. Patient has history of chronic asthma but this does not seem like asthma exacerbation based on clinical presentation, however patient does desat to 70-80s with minimal activity.   Plan: -Echo, BNP per #2 -Continue home inhalers -Consider breathing tx prn    DVT Ppx - has one, therefore on heparin      Melida Quitter, M.D. (PGY3)   ____________________________________    Date/ Time:     ____________________________________      I have seen and examined the patient. I reviewed the resident/fellow note  and agree with the findings and plan of care as documented. My additions and revisions are included.   Signature:  ____________________________________________     Internal Medicine Teaching Service Attending    Date:    ____________________________________________

## 2012-03-06 NOTE — Progress Notes (Signed)
Right upper extremity venous duplex completed.  Preliminary report is positive for DVT in the right subclavian vein.

## 2012-03-06 NOTE — ED Notes (Signed)
Report received, assumed care.  

## 2012-03-06 NOTE — ED Notes (Signed)
Admitting MD at bedside.

## 2012-03-06 NOTE — Progress Notes (Signed)
*  PRELIMINARY RESULTS* Echocardiogram 2D Echocardiogram has been performed.  Katheren Puller 03/06/2012, 2:42 PM

## 2012-03-06 NOTE — Progress Notes (Signed)
ANTICOAGULATION CONSULT NOTE - Initial Consult  Pharmacy Consult for heparin  Indication: r/o pe vs dvt  No Known Allergies  Patient Measurements: Height: 5\' 2"  (157.5 cm) Weight: 190 lb (86.183 kg) IBW/kg (Calculated) : 50.1  Heparin Dosing Weight: 70kg  Vital Signs: Temp: 98.1 F (36.7 C) (04/01 0614) BP: 113/82 mmHg (04/01 0614) Pulse Rate: 94  (04/01 0614)  Labs: No results found for this basename: HGB:2,HCT:3,PLT:3,APTT:3,LABPROT:3,INR:3,HEPARINUNFRC:3,CREATININE:3,CKTOTAL:3,CKMB:3,TROPONINI:3 in the last 72 hours Estimated Creatinine Clearance: 89.7 ml/min (by C-G formula based on Cr of 0.9).  Medical History: Past Medical History  Diagnosis Date  . COPD (chronic obstructive pulmonary disease)   . Chronic asthma   . History of alcoholism     Medications:   (Not in a hospital admission)  Assessment: Facial, right forearm swelling and LE swelling. Current smoker and hx heavy drinking.  Goal of Therapy:  Heparin level 0.3-0.7 units/ml   Plan:  Heparin 5000 units iv x1 then 1200 units/hr. 6 hour HL. -daily HL and CBC starting in am 4/2  Janice Coffin 03/06/2012,6:59 AM

## 2012-03-06 NOTE — ED Notes (Signed)
Doppler at bedside.

## 2012-03-06 NOTE — Progress Notes (Signed)
ANTICOAGULATION CONSULT NOTE: Heparin for subclavian vein thrombosis.  Heparin level = 0.22 on 1200 units/hr Goal heparin level = 0.3-0.7  Heparin level is subtherapeutic. Will increase rate to 1400 units/hr and f/u AM heparin level. No s/s of bleeding.  Cardell Peach, PharmD

## 2012-03-06 NOTE — ED Provider Notes (Signed)
History     CSN: [id number]  Arrival date & time 03/06/12  0610   First MD Initiated Contact with Patient 03/06/12 [phone]      Chief Complaint  Patient presents with  . Facial Swelling    (Consider location/radiation/quality/duration/timing/severity/associated sxs/prior treatment) HPI  Patient presents to the ER complaining of acute onset RUE, right anterior chest and right facial swelling that she states that she first noticed yesterday but woke with morning with increased swelling. Patient states that over the last month her PCP has been treating her with torsemide for bilateral LE swelling, abdominal swelling and bilateral facial swelling and after increasing dosage over the last month she states that her overall swelling had greatly improved. Patient states that until yesterday all swelling had been bilateral and there had never been UE involvement. Patient has a long standing hx of tobacco abuse but states "I quit a week ago." she denies exogenous estrogen use. She denies extremity numbness/tingling/weakness, fevers, chills, difficulty swallowing or breathing. Patient states she has COPD and mild baseline SOB that she treats with inhalers but states that over the last 2 days increasing SOB at rest. Also complaining of mild intermittent "stabbing pain" in right upper shoulder near right nape of her neck. She denies increased cough or hemoptysis. Denies aggravating or alleviating factors.   Past Medical History  Diagnosis Date  . COPD (chronic obstructive pulmonary disease)   . Chronic asthma   . History of alcoholism     Past Surgical History  Procedure Date  . Cardiac surgery   . Lung surgery     Family History  Problem Relation Age of Onset  . Hypertension Father   . Heart disease Father     CHF    History  Substance Use Topics  . Smoking status: Current Everyday Smoker  . Smokeless tobacco: Not on file  . Alcohol Use: Yes     One drink per week    OB History    Grav Para Term Preterm Abortions TAB SAB Ect Mult Living                  Review of Systems  Allergies  Review of patient's allergies indicates no known allergies.  Home Medications   Current Outpatient Rx  Name Route Sig Dispense Refill  . ALBUTEROL SULFATE HFA 108 (90 BASE) MCG/ACT IN AERS Inhalation Inhale 2 puffs into the lungs every 6 (six) hours as needed. For shortness of breath    . FLUTICASONE-SALMETEROL 250-50 MCG/DOSE IN AEPB Inhalation Inhale 1 puff into the lungs every 12 (twelve) hours.    . IBUPROFEN 600 MG PO TABS Oral Take 600 mg by mouth every 6 (six) hours as needed. For pain    . PRESCRIPTION MEDICATION Oral Take 1 tablet by mouth once. Patient states that she took one of her husbands prescribed medication for pain    . TORSEMIDE 20 MG PO TABS Oral Take 20 mg by mouth 2 (two) times daily.      BP 113/82  Pulse 94  Temp 98.1 F (36.7 C)  Resp 16  Ht 5\' 2"  (1.575 m)  Wt 190 lb (86.183 kg)  BMI 34.75 kg/m2  SpO2 98%  LMP 01/28/2012  Physical Exam  Nursing note and vitals reviewed. Constitutional: She is oriented to person, place, and time. She appears well-developed and well-nourished. No distress.  HENT:  Head: Normocephalic and atraumatic.       Mild soft tissue swelling of right fact. No swelling of  lips, uvula, sublingual region. Patent airway  Eyes: Conjunctivae and EOM are normal. Pupils are equal, round, and reactive to light.  Neck: Normal range of motion. Neck supple.       Soft tissue swelling of right lateral neck into right anterior chest wall with pitting edema.   Cardiovascular: Normal rate, regular rhythm, normal heart sounds and intact distal pulses.  Exam reveals no gallop and no friction rub.   No murmur heard. Pulmonary/Chest: Effort normal. No respiratory distress. She has no wheezes. She has rales. She exhibits no tenderness.       1+ edema of right anterior chest wall with right breast significantly larger that left. Mild TTP of  entire right chest wall. No erythema.   Swelling of right breast without any palpable mass  Abdominal: Soft. Bowel sounds are normal. She exhibits distension. She exhibits no mass. There is no tenderness. There is no rebound and no guarding.  Musculoskeletal: Normal range of motion. She exhibits no edema and no tenderness.       Pitting edema of RUE and bilateral 1+ pitting edema of bilateral LE.   Right axillary LAD  Neurological: She is alert and oriented to person, place, and time.  Skin: Skin is warm and dry. No rash noted. She is not diaphoretic. No erythema.  Psychiatric: She has a normal mood and affect.    ED Course  Procedures (including critical care time)  Dr. Patria Mane to bedside to discuss case. Patient on monitor. Imaging pending.   Heparin consult to pharmacy because of strong suspicion for venous occlusion.   Labs Reviewed  CBC - Abnormal; Notable for the following:    RBC 5.26 (*)    Hemoglobin 16.3 (*)    HCT 50.4 (*)    RDW 15.6 (*)    All other components within normal limits  DIFFERENTIAL  BASIC METABOLIC PANEL  PROTIME-INR  APTT  HEPARIN LEVEL (UNFRACTIONATED)   Dg Chest 2 View  03/06/2012  *RADIOLOGY REPORT*  Clinical Data: Right-sided chest pain.  Swelling.  CHEST - 2 VIEW  Comparison: 01/24/2012.  Findings: Peribronchial thickening may represent changes of chronic bronchitis.  Basilar scarring.  No segmental infiltrate or pneumothorax.  Prominence right hilar region.  This may represent overlapping vascular structures however, adenopathy or mass not excluded.  Heart size top normal.  IMPRESSION: Peribronchial thickening may represent changes of chronic bronchitis.  Prominence right hilar region.  This may represent overlapping vascular structures however, adenopathy or mass not excluded.  Original Report Authenticated By: Fuller Canada, M.D.   Ct Angio Chest W/cm &/or Wo Cm  03/06/2012  *RADIOLOGY REPORT*  Clinical Data: Right upper extremity swelling.   Question abnormal chest x-ray.  CT ANGIOGRAPHY CHEST  Technique:  Multidetector CT imaging of the chest using the standard protocol during bolus administration of intravenous contrast. Multiplanar reconstructed images including MIPs were obtained and reviewed to evaluate the vascular anatomy.  Contrast:  Comparison: 03/06/2012 chest x-ray.  Findings: Filling defect right internal jugular vein/subclavian vein junction just proximal to the formation of the superior vena cava involving the right subclavian vein.  Marked  right chest wall edema involving the right breast and right axilla.  Right axillary adenopathy.  No pulmonary embolus (probable motion artifact involving the right lower lobe pulmonary vessels).  No aortic dissection.  Mediastinal and right hilar adenopathy.  Scattered pulmonary parenchymal changes without dominant worrisome mass.  Patchy parenchymal changes most notable right upper lobe. This can be reevaluated follow-up after acute episode has  cleared.  Small right-sided pleural effusion.  Right heart enlargement. Small superiorly located pericardial effusion.  No bony destructive lesion.  Ascites.  Slight lobularity liver without other findings of cirrhosis.  Mild third spacing of fluid abdominal wall.  IMPRESSION: Thrombosed right subclavian vein.  Marked  right chest wall edema involving the right breast and right axilla.  Right axillary adenopathy.  No pulmonary embolus (probable motion artifact involving the right lower lobe pulmonary vessels).  Mediastinal and right hilar adenopathy.  Scattered pulmonary parenchymal changes without dominant worrisome mass.  Patchy parenchymal changes most notable right upper lobe. This can be reevaluated follow-up after acute episode has cleared.  Small right-sided pleural effusion.  Right heart enlargement. Small superiorly located pericardial effusion.  Ascites.  Slight lobularity liver without other findings of cirrhosis.  Mild third spacing of fluid  abdominal wall.  Original Report Authenticated By: Fuller Canada, M.D.     1. Subclavian vein occlusion   2. Hilar adenopathy   3. Shortness of breath   4. COPD (chronic obstructive pulmonary disease)   5. Axillary adenopathy     CRITICAL CARE Performed by: Jenness Corner   Total critical care time: 90  Critical care time was exclusive of separately billable procedures and treating other patients.  Critical care was necessary to treat or prevent imminent or life-threatening deterioration.  Critical care was time spent personally by me on the following activities: development of treatment plan with patient and/or surrogate as well as nursing, discussions with consultants, evaluation of patient's response to treatment, examination of patient, obtaining history from patient or surrogate, ordering and performing treatments and interventions, ordering and review of laboratory studies, ordering and review of radiographic studies, pulse oximetry and re-evaluation of patient's condition.   MDM  Dr. Rhetta Mura with [facility] to admit patient. VSS. Will continue to monitor while in ER. Heparin being given. It is still in question why patient has subclavian vein occlusion with marked hilar and axiallary LAD without definitive mass or PE as well as marked ascites.         Jenness Corner, [place] 03/06/12 1018

## 2012-03-06 NOTE — ED Provider Notes (Signed)
Medical screening examination/treatment/procedure(s) were conducted as a shared visit with non-physician practitioner(s) and myself.  I personally evaluated the patient during the encounter  Right subclavian DVT. Started on heparin empirically on arrival CTA negative for PE or mass. No obvious compression externally of the right subclavian or brachiocephalic vein.   Lyanne Co, MD 03/06/12 1434

## 2012-03-06 NOTE — Progress Notes (Signed)
03/06/2012 1310 Pt arrived from ED into 3302. VSS. Patient is not in any type of distress. Discussed safety plan, call bell with in reach, and bed in low position. Bed alarm set per protocol. Will continue to monitor. Mary [name]

## 2012-03-06 NOTE — ED Notes (Signed)
No voiced complaints presently. NAD.  Respirations even & unlabored, no distress.  Awaiting admitting MD

## 2012-03-06 NOTE — Progress Notes (Signed)
ANTICOAGULATION CONSULT NOTE - Follow Up Consult  Pharmacy Consult for heparin Indication: subclavian vein thrombosis  No Known Allergies  Patient Measurements: Height: 5\' 2"  (157.5 cm) Weight: 190 lb (86.183 kg) IBW/kg (Calculated) : 50.1  Heparin Dosing Weight: 70kg  Vital Signs: Temp: 98.5 F (36.9 C) (04/01 1310) Temp src: Oral (04/01 1310) BP: 118/75 mmHg (04/01 1310) Pulse Rate: 92  (04/01 1310)  Labs:  Basename 03/06/12 1346 03/06/12 0725 03/06/12 0630  HGB -- -- 16.3*  HCT -- -- 50.4*  PLT -- -- 192  APTT -- 29 --  LABPROT -- 16.2* --  INR -- 1.27 --  HEPARINUNFRC 0.31 -- --  CREATININE -- -- 1.08  CKTOTAL -- -- --  CKMB -- -- --  TROPONINI -- -- --   Estimated Creatinine Clearance: 74.7 ml/min (by C-G formula based on Cr of 1.08).   Medications:  Infusions:    . heparin 1,200 Units/hr (03/06/12 1400)    Assessment: 35 yof started on IV heparin for subclavian vein thrombosis. First heparin level is therapeutic at 0.31. No bleeding noted.   Goal of Therapy:  Heparin level 0.3-0.7 units/ml   Plan:  1. Continue heparin at 1200 units/hr 2. Check a heparin level at 2000 (~ 6 hour after last level)  Coy Rochford, Drake Leach 03/06/2012,2:37 PM

## 2012-03-07 ENCOUNTER — Inpatient Hospital Stay (HOSPITAL_COMMUNITY): Payer: 59

## 2012-03-07 ENCOUNTER — Encounter (HOSPITAL_COMMUNITY)
Admission: EM | Disposition: A | Discharge status: Home Health | Payer: Self-pay | Source: Home / Self Care | Attending: Internal Medicine

## 2012-03-07 DIAGNOSIS — E662 Morbid (severe) obesity with alveolar hypoventilation: Secondary | ICD-10-CM

## 2012-03-07 DIAGNOSIS — I2789 Other specified pulmonary heart diseases: Secondary | ICD-10-CM

## 2012-03-07 DIAGNOSIS — I279 Pulmonary heart disease, unspecified: Secondary | ICD-10-CM

## 2012-03-07 DIAGNOSIS — I2699 Other pulmonary embolism without acute cor pulmonale: Secondary | ICD-10-CM

## 2012-03-07 DIAGNOSIS — J9620 Acute and chronic respiratory failure, unspecified whether with hypoxia or hypercapnia: Secondary | ICD-10-CM

## 2012-03-07 DIAGNOSIS — I509 Heart failure, unspecified: Secondary | ICD-10-CM

## 2012-03-07 HISTORY — PX: RIGHT HEART CATHETERIZATION: SHX5447

## 2012-03-07 LAB — BLOOD GAS, ARTERIAL
Bicarbonate: 43.3 mEq/L — ABNORMAL HIGH (ref 20.0–24.0)
Patient temperature: 97.8
TCO2: 45.3 mmol/L (ref 0–100)
pH, Arterial: 7.443 — ABNORMAL HIGH (ref 7.350–7.400)

## 2012-03-07 LAB — BASIC METABOLIC PANEL
CO2: 40 mEq/L (ref 19–32)
Chloride: 87 mEq/L — ABNORMAL LOW (ref 96–112)
Glucose, Bld: 113 mg/dL — ABNORMAL HIGH (ref 70–99)
Potassium: 3 mEq/L — ABNORMAL LOW (ref 3.5–5.1)
Sodium: 137 mEq/L (ref 135–145)

## 2012-03-07 LAB — CORTISOL-AM, BLOOD: Cortisol - AM: 15.7 ug/dL (ref 4.3–22.4)

## 2012-03-07 LAB — POCT I-STAT 3, VENOUS BLOOD GAS (G3P V)
Acid-Base Excess: 9 mmol/L — ABNORMAL HIGH (ref 0.0–2.0)
Bicarbonate: 39.1 mEq/L — ABNORMAL HIGH (ref 20.0–24.0)
O2 Saturation: 67 %
pO2, Ven: 40 mmHg (ref 30.0–45.0)

## 2012-03-07 LAB — CBC
HCT: 49.1 % — ABNORMAL HIGH (ref 36.0–46.0)
Hemoglobin: 15.4 g/dL — ABNORMAL HIGH (ref 12.0–15.0)
MCH: 30.4 pg (ref 26.0–34.0)
MCV: 97 fL (ref 78.0–100.0)
Platelets: 185 10*3/uL (ref 150–400)
RBC: 5.06 MIL/uL (ref 3.87–5.11)
WBC: 11.8 10*3/uL — ABNORMAL HIGH (ref 4.0–10.5)

## 2012-03-07 LAB — HEPARIN LEVEL (UNFRACTIONATED): Heparin Unfractionated: 0.24 IU/mL — ABNORMAL LOW (ref 0.30–0.70)

## 2012-03-07 SURGERY — RIGHT HEART CATH
Anesthesia: LOCAL

## 2012-03-07 MED ORDER — DIAZEPAM 5 MG PO TABS
5.0000 mg | ORAL_TABLET | ORAL | Status: AC
  Administered 2012-03-07: 5 mg via ORAL
  Filled 2012-03-07: qty 1

## 2012-03-07 MED ORDER — COUMADIN BOOK
Freq: Once | Status: AC
  Administered 2012-03-07: 21:00:00
  Filled 2012-03-07: qty 1

## 2012-03-07 MED ORDER — ACETAZOLAMIDE SODIUM 500 MG IJ SOLR
500.0000 mg | Freq: Once | INTRAMUSCULAR | Status: AC
  Administered 2012-03-07: 500 mg via INTRAVENOUS
  Filled 2012-03-07: qty 500

## 2012-03-07 MED ORDER — LIDOCAINE HCL (PF) 1 % IJ SOLN
INTRAMUSCULAR | Status: AC
  Filled 2012-03-07: qty 30

## 2012-03-07 MED ORDER — WARFARIN SODIUM 7.5 MG PO TABS
7.5000 mg | ORAL_TABLET | Freq: Once | ORAL | Status: AC
  Administered 2012-03-07: 7.5 mg via ORAL
  Filled 2012-03-07: qty 1

## 2012-03-07 MED ORDER — IOHEXOL 300 MG/ML  SOLN
20.0000 mL | INTRAMUSCULAR | Status: AC
  Administered 2012-03-07 (×2): 20 mL via ORAL

## 2012-03-07 MED ORDER — WARFARIN VIDEO
Freq: Once | Status: AC
  Administered 2012-03-07: 21:00:00

## 2012-03-07 MED ORDER — HEPARIN (PORCINE) IN NACL 2-0.9 UNIT/ML-% IJ SOLN
INTRAMUSCULAR | Status: AC
  Filled 2012-03-07: qty 1000

## 2012-03-07 MED ORDER — IOHEXOL 300 MG/ML  SOLN
80.0000 mL | Freq: Once | INTRAMUSCULAR | Status: AC | PRN
  Administered 2012-03-07: 80 mL via INTRAVENOUS

## 2012-03-07 MED ORDER — HEPARIN (PORCINE) IN NACL 100-0.45 UNIT/ML-% IJ SOLN
1650.0000 [IU]/h | INTRAMUSCULAR | Status: DC
  Administered 2012-03-07 – 2012-03-08 (×2): 1700 [IU]/h via INTRAVENOUS
  Administered 2012-03-09: 1650 [IU]/h via INTRAVENOUS
  Administered 2012-03-09: 1700 [IU]/h via INTRAVENOUS
  Filled 2012-03-07 (×8): qty 250

## 2012-03-07 MED ORDER — POTASSIUM CHLORIDE CRYS ER 20 MEQ PO TBCR
40.0000 meq | EXTENDED_RELEASE_TABLET | ORAL | Status: AC
  Administered 2012-03-07: 40 meq via ORAL
  Filled 2012-03-07: qty 2

## 2012-03-07 MED ORDER — MIDAZOLAM HCL 2 MG/2ML IJ SOLN
INTRAMUSCULAR | Status: AC
  Filled 2012-03-07: qty 2

## 2012-03-07 MED ORDER — FENTANYL CITRATE 0.05 MG/ML IJ SOLN
INTRAMUSCULAR | Status: AC
  Filled 2012-03-07: qty 2

## 2012-03-07 MED ORDER — POTASSIUM CHLORIDE CRYS ER 20 MEQ PO TBCR
40.0000 meq | EXTENDED_RELEASE_TABLET | Freq: Once | ORAL | Status: AC
  Administered 2012-03-07: 40 meq via ORAL

## 2012-03-07 MED ORDER — SODIUM CHLORIDE 0.9 % IJ SOLN
INTRAMUSCULAR | Status: AC
  Administered 2012-03-07: 18:00:00
  Filled 2012-03-07: qty 10

## 2012-03-07 MED ORDER — NALOXONE HCL 0.4 MG/ML IJ SOLN
INTRAMUSCULAR | Status: AC
  Administered 2012-03-07: 0.4 mg
  Filled 2012-03-07: qty 1

## 2012-03-07 MED ORDER — HEPARIN BOLUS VIA INFUSION
2000.0000 [IU] | Freq: Once | INTRAVENOUS | Status: AC
  Administered 2012-03-07: 2000 [IU] via INTRAVENOUS
  Filled 2012-03-07: qty 2000

## 2012-03-07 MED ORDER — WARFARIN - PHARMACIST DOSING INPATIENT
Freq: Every day | Status: DC
  Administered 2012-03-10: 17:00:00

## 2012-03-07 MED ORDER — NALOXONE HCL 0.4 MG/ML IJ SOLN: 0.4000 mg | INTRAMUSCULAR | Status: DC | PRN

## 2012-03-07 MED ORDER — POTASSIUM CHLORIDE CRYS ER 20 MEQ PO TBCR
EXTENDED_RELEASE_TABLET | ORAL | Status: AC
  Administered 2012-03-07: 40 meq
  Filled 2012-03-07: qty 2

## 2012-03-07 NOTE — Progress Notes (Signed)
6:16 AM    Heparin level remains subtherapeutic x2 levels for sublclavian vein thombus   No bleeding or infusion issues.   rebolus 2000 units and increase to 1700 units/hr   Recheck in 6 hours.   Janice Coffin

## 2012-03-07 NOTE — H&P (Signed)
IM Attending  32 woman admitted for 1 month of anasarca.  Edema began in bilat legs and spread to abd, right face and right arm.  Weight gain documented at least 20 lbs in 1 month.  Final event just before adm was focal swelling of right arm, face and breast and finding of subclavian DVT.  Remote hx of heart surgery in first days of life, somewhere in [place].  She has no recent contact with any family and no surviving close family, and does not even know where the surgery was performed.  says she had a normal upbringing and was able to play sports ("not well") as a child.  No follow-up heart or other medical care until the current illness.  Never pregnant.  One admission 1 year ago in [facility] for pneumonia with routine recovery.  Was a heavy drinker during most of past 10 years but cut way down over past 2 years.  No known episodes of related illness or sx of liver disease.  Smoked 3/4 PPD for 29 years.   Only chronic diagnosis before past 1 month was "asthma" treated with albuterol.  Over past 1 month has been seen by Dr. Milus Glazier and Dr. Jens Som for her anasarca.  W/u was just being scheduled until right DVT which brought her in.  There is some evidence for liver disease but I doubt this is the principal issue: bilirubin normal, AST 41, Alk Phos normal, INR mildly elevated, Albumin 2.8.  Creatinine and UA unremarkable.  Imaging confirms DVT, ascites and enlargement of RA and RV.  Chest CT raises hint of basilar emphysema.  Labs show metabolic alkalosis with somewhat excess respiratory compensation:  7.44 -- 64 -- 51 -- 43.  The alkalosis is probably low cardiac output plus recent use of loop diuretics.  The resp acidosis may be COPD and/or fatigue.  Impression:  I think the best fit is right heart failure, possibly related to congenital heart disease but more likely cor pulmonale.  This may have produced anasarca and cardiac cirrhosis.  DVT may be sluggish return plus  thrombophilia from Protein C or S deficiency.  The latter would be more usual with nephrotic syndrome which she does not have; liver disease more often causes hemophilia than thrombophilia.  The first step is Cardiology consult.  Will do whatever we can to collect records of her neonatal chest surgery.

## 2012-03-07 NOTE — Progress Notes (Signed)
ANTICOAGULATION CONSULT NOTE - Initial Consult  Pharmacy Consult for  Coumadin Indication: subclavian vein thrombosis  No Known Allergies  Patient Measurements: Height: 5\' 2"  (157.5 cm) Weight: 190 lb 0.6 oz (86.2 kg) IBW/kg (Calculated) : 50.1  Heparin Dosing Weight:   Vital Signs: Temp: 97.8 F (36.6 C) (04/02 1600) Temp src: Oral (04/02 1600) BP: 103/73 mmHg (04/02 1800) Pulse Rate: 90  (04/02 1800)  Labs:  Basename 03/07/12 0420 03/06/12 2019 03/06/12 1346 03/06/12 0725 03/06/12 0630  HGB 15.4* -- -- -- 16.3*  HCT 49.1* -- -- -- 50.4*  PLT 185 -- -- -- 192  APTT -- -- -- 29 --  LABPROT -- -- -- 16.2* --  INR -- -- -- 1.27 --  HEPARINUNFRC 0.24* 0.22* 0.31 -- --  CREATININE 0.66 -- -- -- 1.08  CKTOTAL -- -- -- -- --  CKMB -- -- -- -- --  TROPONINI -- -- -- -- --   Estimated Creatinine Clearance: 100.9 ml/min (by C-G formula based on Cr of 0.66).  Medical History: Past Medical History  Diagnosis Date  . COPD (chronic obstructive pulmonary disease)     no documented PFTs, but patient said it was confirmed  . Chronic asthma   . History of alcoholism     sober last 5 years  . Tobacco abuse   . Irregular menses     Medications:  Scheduled:    . acetaZOLAMIDE  500 mg Intravenous Once  . coumadin book   Does not apply Once  . diazepam  5 mg Oral On Call  . fentaNYL      . Fluticasone-Salmeterol  1 puff Inhalation Q12H  . heparin      . heparin  2,000 Units Intravenous Once  . iohexol  20 mL Oral Q1 Hr x 2  . lidocaine      . midazolam      . naloxone      . potassium chloride SA      . potassium chloride  40 mEq Oral Q4H  . potassium chloride  40 mEq Oral Once  . sodium chloride  3 mL Intravenous Q12H  . sodium chloride  3 mL Intravenous Q12H  . sodium chloride      . warfarin  7.5 mg Oral Once  . warfarin   Does not apply Once  . Warfarin - Pharmacist Dosing Inpatient   Does not apply q1800    Assessment: 36 yr old female with subclavian vein  thrombosis has been on heparin since 4/1. Now to start on coumadin. Goal of Therapy:  INR 2-3   Plan:  Will give 7.5 mg Coumadin today. Ordered coumadin booklet and video for pt. Daily PT/INR. Will f/u INR.   Mary [name] 03/07/2012,8:59 PM

## 2012-03-07 NOTE — Progress Notes (Signed)
ANTICOAGULATION CONSULT NOTE - Follow Up Consult  Pharmacy Consult for heparin Indication: subclavian vein thrombosis  No Known Allergies  Patient Measurements: Height: 5\' 2"  (157.5 cm) Weight: 190 lb 0.6 oz (86.2 kg) IBW/kg (Calculated) : 50.1  Heparin Dosing Weight: 70kg  Vital Signs: Temp: 98 F (36.7 C) (04/02 1200) Temp src: Oral (04/02 1200) BP: 110/74 mmHg (04/02 1450) Pulse Rate: 81  (04/02 1450)  Labs:  Basename 03/07/12 0420 03/06/12 2019 03/06/12 1346 03/06/12 0725 03/06/12 0630  HGB 15.4* -- -- -- 16.3*  HCT 49.1* -- -- -- 50.4*  PLT 185 -- -- -- 192  APTT -- -- -- 29 --  LABPROT -- -- -- 16.2* --  INR -- -- -- 1.27 --  HEPARINUNFRC 0.24* 0.22* 0.31 -- --  CREATININE 0.66 -- -- -- 1.08  CKTOTAL -- -- -- -- --  CKMB -- -- -- -- --  TROPONINI -- -- -- -- --   Estimated Creatinine Clearance: 100.9 ml/min (by C-G formula based on Cr of 0.66).   Medications:  Infusions:     . DISCONTD: heparin 1,400 Units/hr (03/07/12 1200)    Assessment: Mary [name] on IV heparin for subclavian vein thrombosis.  Heparin was turned off this afternoon for emergent right heart cath.  Now, to restart heparin 4 hours post sheath removal, sheath was pulled at 1430.  No bleeding noted.  Goal of Therapy:  Heparin level 0.3-0.7 units/ml   Plan:  1. Restart heparin at 1700 units/hr at 1830 (4 hr post sheath removal) 2. Check a heparin level at 0100 4/3 (~6 hr heparin level)  Rolland Porter, Pharm.D., BCPS Clinical Pharmacist Pager: [phone]

## 2012-03-07 NOTE — Progress Notes (Signed)
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 She admits to swelling and worsening of breath over the past month. She had recently started to see Dr. Jens Som for cardio needs, but prior to that she was in her usual state of health. Her swelling started about 1 month ago and has progressively gotten worse. She tried a few days of torsemide with minimal relief and is currently about 20 lbs positive in weight compared to one month ago. Her swelling is widespread on the right side of her body namely b/l lower extremities, right breast, stomach, and right side of face. She comes to the hospital with concerns over the swelling. Objective: Vital signs in last 24 hours: Filed Vitals:   03/07/12 0406 03/07/12 0800 03/07/12 0835 03/07/12 1200  BP: 105/59 114/72  113/63  Pulse: 98 81    Temp: 98.1 F (36.7 C) 97.8 F (36.6 C)    TempSrc: Oral Oral  Oral  Resp: 20 19    Height:      Weight: 86.2 kg (190 lb 0.6 oz)     SpO2: 90% 98% 95%    Weight change: 0.016 kg (0.6 oz)  Intake/Output Summary (Last 24 hours) at 03/07/12 1216 Last data filed at 03/07/12 1200  Gross per 24 hour  Intake    270 ml  Output   1500 ml  Net  -1230 ml   Physical Exam: BP 113/63  Pulse 81  Temp(Src) 97.8 F (36.6 C) (Oral)  Resp 19  Ht 5\' 2"  (1.575 m)  Wt 86.2 kg (190 lb 0.6 oz)  BMI 34.76 kg/m2  SpO2 95%  LMP 01/28/2012 Head: Normocephalic, without obvious abnormality, atraumatic Eyes: conjunctivae/corneas clear. PERRL, EOM's intact. Fundi benign. Ears: normal TM's and external ear canals both ears Nose: Nares normal. Septum midline. Mucosa normal. No drainage or sinus tenderness. Throat: lips, mucosa, and tongue normal; teeth and gums normal Neck: no adenopathy, supple, symmetrical, trachea midline, thyroid not enlarged, symmetric, no tenderness/mass/nodules and markedly swollen neck, unable to  appreciate JVD Back: symmetric, no curvature. ROM normal. No CVA tenderness. Lungs: rales base - B/L and bibasilar Breasts: right breast erythema and swelling Heart: regular rate and rhythm Abdomen: soft, non-tender; bowel sounds normal; no masses,  no organomegaly and moderate ascites Extremities: edema B/L LE, right arm edema Skin: Skin color, texture, turgor normal. No rashes or lesions Lab Results: Basic Metabolic Panel:  Lab 03/07/12 1610 03/06/12 1346 03/06/12 0630  NA 137 -- 131*  K 3.0* -- 3.9  CL 87* -- 81*  CO2 40* -- 38*  GLUCOSE 113* -- 91  BUN 24* -- 41*  CREATININE 0.66 -- 1.08  CALCIUM 8.5 -- 9.1  MG -- 1.7 --  PHOS -- -- --   Liver Function Tests:  Lab 03/06/12 1346  AST 41*  ALT 29  ALKPHOS 67  BILITOT 1.1  PROT 5.7*  ALBUMIN 2.8*   No results found for this basename: LIPASE:2,AMYLASE:2 in the last 168 hours No results found for this basename: AMMONIA:2 in the last 168 hours CBC:  Lab 03/07/12 0420 03/06/12 0630  WBC 11.8* 13.1*  NEUTROABS -- 9.4*  HGB 15.4* 16.3*  HCT 49.1* 50.4*  MCV 97.0 95.8  PLT 185 192   Cardiac Enzymes: No results found for this basename: CKTOTAL:3,CKMB:3,CKMBINDEX:3,TROPONINI:3 in the last 168 hours BNP:  Lab 03/06/12 1345  PROBNP 2375.0*   D-Dimer: No results found for this basename:  DDIMER:2 in the last 168 hours CBG: No results found for this basename: GLUCAP:6 in the last 168 hours Hemoglobin A1C: No results found for this basename: HGBA1C in the last 168 hours Fasting Lipid Panel: No results found for this basename: CHOL,HDL,LDLCALC,TRIG,CHOLHDL,LDLDIRECT in the last 147 hours Thyroid Function Tests:  Lab 03/06/12 1346  TSH 4.231  T4TOTAL --  FREET4 --  T3FREE --  THYROIDAB --   Coagulation:  Lab 03/06/12 0725  LABPROT 16.2*  INR 1.27   Anemia Panel: No results found for this basename: VITAMINB12,FOLATE,FERRITIN,TIBC,IRON,RETICCTPCT in the last 168 hours Urine Drug Screen: Drugs of Abuse  No  results found for this basename: labopia, cocainscrnur, labbenz, amphetmu, thcu, labbarb    Alcohol Level: No results found for this basename: ETH:2 in the last 168 hours Urinalysis:  Lab 03/06/12 1714  COLORURINE YELLOW  LABSPEC 1.039*  PHURINE 5.5  GLUCOSEU NEGATIVE  HGBUR NEGATIVE  BILIRUBINUR NEGATIVE  KETONESUR 15*  PROTEINUR NEGATIVE  UROBILINOGEN 1.0  NITRITE NEGATIVE  LEUKOCYTESUR NEGATIVE    Micro Results: Recent Results (from the past 240 hour(s))  MRSA PCR SCREENING     Status: Normal   Collection Time   03/06/12  4:23 PM      Component Value Range Status Comment   MRSA by PCR NEGATIVE  NEGATIVE  Final    Studies/Results: Dg Chest 2 View  03/06/2012  *RADIOLOGY REPORT*  Clinical Data: Right-sided chest pain.  Swelling.  CHEST - 2 VIEW  Comparison: 01/24/2012.  Findings: Peribronchial thickening may represent changes of chronic bronchitis.  Basilar scarring.  No segmental infiltrate or pneumothorax.  Prominence right hilar region.  This may represent overlapping vascular structures however, adenopathy or mass not excluded.  Heart size top normal.  IMPRESSION: Peribronchial thickening may represent changes of chronic bronchitis.  Prominence right hilar region.  This may represent overlapping vascular structures however, adenopathy or mass not excluded.  Original Report Authenticated By: Fuller Canada, M.D.   Ct Angio Chest W/cm &/or Wo Cm  03/06/2012  **ADDENDUM** CREATED: 03/06/2012 10:02:30  Contrast utilized was 100 ml of Omnipaque 350.  **END ADDENDUM** SIGNED BY: Almedia Balls. Constance Goltz, M.D.    03/06/2012  *RADIOLOGY REPORT*  Clinical Data: Right upper extremity swelling.  Question abnormal chest x-ray.  CT ANGIOGRAPHY CHEST  Technique:  Multidetector CT imaging of the chest using the standard protocol during bolus administration of intravenous contrast. Multiplanar reconstructed images including MIPs were obtained and reviewed to evaluate the vascular anatomy.  Contrast:   Comparison: 03/06/2012 chest x-ray.  Findings: Filling defect right internal jugular vein/subclavian vein junction just proximal to the formation of the superior vena cava involving the right subclavian vein.  Marked  right chest wall edema involving the right breast and right axilla.  Right axillary adenopathy.  No pulmonary embolus (probable motion artifact involving the right lower lobe pulmonary vessels).  No aortic dissection.  Mediastinal and right hilar adenopathy.  Scattered pulmonary parenchymal changes without dominant worrisome mass.  Patchy parenchymal changes most notable right upper lobe. This can be reevaluated follow-up after acute episode has cleared.  Small right-sided pleural effusion.  Right heart enlargement. Small superiorly located pericardial effusion.  No bony destructive lesion.  Ascites.  Slight lobularity liver without other findings of cirrhosis.  Mild third spacing of fluid abdominal wall.  IMPRESSION: Thrombosed right subclavian vein.  Marked  right chest wall edema involving the right breast and right axilla.  Right axillary adenopathy.  No pulmonary embolus (probable motion artifact involving the right lower  lobe pulmonary vessels).  Mediastinal and right hilar adenopathy.  Scattered pulmonary parenchymal changes without dominant worrisome mass.  Patchy parenchymal changes most notable right upper lobe. This can be reevaluated follow-up after acute episode has cleared.  Small right-sided pleural effusion.  Right heart enlargement. Small superiorly located pericardial effusion.  Ascites.  Slight lobularity liver without other findings of cirrhosis.  Mild third spacing of fluid abdominal wall.  Original Report Authenticated By: Fuller Canada, M.D.   Ct Abdomen Pelvis W Contrast  03/07/2012  *RADIOLOGY REPORT*  Clinical Data: Right-sided edema and adenopathy  CT ABDOMEN AND PELVIS WITH CONTRAST  Technique:  Multidetector CT imaging of the abdomen and pelvis was performed following  the standard protocol during bolus administration of intravenous contrast.  Contrast:  80 ml of omni 300  Comparison: None.  Findings:  There are small bilateral pleural effusions, right greater than left.  The mild interlobular septal thickening is noted consistent with mild interstitial edema.  There is diffuse body wall edema identified.  Focal area of low density adjacent to the falciform ligament is identified unlike lie represents focal fatty deposition.  There is mild edema of the gallbladder wall.  Non-specific in a hypoproteinemic and volume overload state.  No biliary dilatation.  The pancreas appears normal.  The spleen is negative.  Both adrenal glands are normal.  The kidneys are both unremarkable.  There are multiple upper abdominal lymph nodes but no evidence for adenopathy.  There is no pelvic or inguinal adenopathy. Intermediate density fluid within the urinary bladder is present.  This may reflect excreted contrast material from recent chest CT.  There is no pelvic or inguinal adenopathy.  The stomach and the small bowel loops appear normal.  The appendix is identified and is normal.  The colon is negative.  Moderate ascites identified within the abdomen and pelvis.  IMPRESSION:  1.  Findings compatible with fluid overload state with ascites, body wall edema, and pleural effusions. 2.  No mass or adenopathy identified.  Original Report Authenticated By: Rosealee Albee, M.D.   Medications:  Scheduled Meds:   . Fluticasone-Salmeterol  1 puff Inhalation Q12H  . heparin  2,000 Units Intravenous Once  . iohexol  20 mL Oral Q1 Hr x 2  . potassium chloride  40 mEq Oral Q4H  . sodium chloride  3 mL Intravenous Q12H  . sodium chloride  3 mL Intravenous Q12H  . DISCONTD: enoxaparin  40 mg Subcutaneous Q24H   Continuous Infusions:   . heparin 1,400 Units/hr (03/07/12 1200)   PRN Meds:.acetaminophen, acetaminophen, albuterol, iohexol, ondansetron (ZOFRAN) IV, ondansetron, sodium  chloride  Current Facility-Administered Medications  Medication Dose Route Frequency Provider Last Rate Last Dose  . acetaminophen (TYLENOL) tablet 650 mg  650 mg Oral Q6H PRN Melida Quitter, MD   650 mg at 03/06/12 1755   Or  . acetaminophen (TYLENOL) suppository 650 mg  650 mg Rectal Q6H PRN Amanjot Sidhu, MD      . albuterol (PROVENTIL HFA;VENTOLIN HFA) 108 (90 BASE) MCG/ACT inhaler 2 puff  2 puff Inhalation Q6H PRN Melida Quitter, MD   2 puff at 03/07/12 0835  . Fluticasone-Salmeterol (ADVAIR) 250-50 MCG/DOSE inhaler 1 puff  1 puff Inhalation Q12H Amanjot Sidhu, MD   1 puff at 03/07/12 0841  . heparin ADULT infusion 100 units/mL (25000 units/250 mL)  1,400 Units/hr Intravenous Continuous Ulyess Mort, MD 14 mL/hr at 03/07/12 1200 1,400 Units/hr at 03/07/12 1200  . heparin bolus via infusion 2,000 Units  2,000  Units Intravenous Once Ulyess Mort, MD   2,000 Units at 03/07/12 0630  . iohexol (OMNIPAQUE) 300 MG/ML solution 20 mL  20 mL Oral Q1 Hr x 2 Medication Radiologist, MD   20 mL at 03/07/12 0730  . iohexol (OMNIPAQUE) 300 MG/ML solution 80 mL  80 mL Intravenous Once PRN Medication Radiologist, MD   80 mL at 03/07/12 1027  . ondansetron (ZOFRAN) tablet 4 mg  4 mg Oral Q6H PRN Melida Quitter, MD       Or  . ondansetron (ZOFRAN) injection 4 mg  4 mg Intravenous Q6H PRN Amanjot Sidhu, MD      . potassium chloride SA (K-DUR,KLOR-CON) CR tablet 40 mEq  40 mEq Oral Q4H Linward Headland, MD   40 mEq at 03/07/12 0754  . sodium chloride 0.9 % injection 3 mL  3 mL Intravenous Q12H Melida Quitter, MD   3 mL at 03/06/12 2132  . sodium chloride 0.9 % injection 3 mL  3 mL Intravenous Q12H Melida Quitter, MD   3 mL at 03/06/12 2132  . sodium chloride 0.9 % injection 3 mL  3 mL Intravenous PRN Melida Quitter, MD      . DISCONTD: enoxaparin (LOVENOX) injection 40 mg  40 mg Subcutaneous Q24H Amanjot Sidhu, MD         Assessment/Plan:  Anasarca: generalized edema in both lower extremities, abdomen, right  breast, and right face. Likely due to right heart failure secondary to pulmonary hypoxic vasoconstriction   DVT of upper extremity (Subclavian vein thrombosis): CT angio and doppler confirmed and heparin per pharmacy  COPD: Her breathing has been controlled with albuterol and advair. She is not in any respiratory distress and is breathing comfortably in bed with 2 L oxygen.  Abdominal distension: CT abd confirmed moderate ascites again likely due to right heart failure.  Bilateral leg edema: lower extremity pitting edema, cor pulmonale explains all her symptoms related to swelling and pitting edema  Axillary lymphadenopathy: CT angio chest indicates right chest wall edema involving the right breast and right axilla, adenopathy likely benign reactive adenopathy secondary to poor drainage from axilla.  Disposition: She is very pleasant and resting comfortably in bed. Cardio consult requested. She can transfer to a step down bed. We will monitor her further and address issues as needed.      LOS: 1 day   This is a Psychologist, occupational Note.  The care of the patient was discussed with Dr. [name] and the assessment and plan formulated with their assistance.  Please see their attached note for official documentation of the daily encounter.  Lewie Chamber 03/07/2012, 12:16 PM

## 2012-03-07 NOTE — Consult Note (Signed)
CARDIOLOGY CONSULT NOTE  Patient ID: Mary [name], MRN: [id number], DOB/AGE: 35-24-1977 35 y.o. Admit date: 03/06/2012 Date of Consult: 03/07/2012  Primary Physician: Elvina Sidle, MD Primary Cardiologist: Olga Millers MD  Chief Complaint: Right arm, face, breast swelling Reason for Consultation: CHF  HPI: 36 y.o. female w/ PMHx significant for tobacco abuse, alcoholism, and COPD who presented to [facility] on 03/06/2012 with complaints of right arm, face, and breast swelling.  The patient reports she first noticed abdominal tightness in February of this year at which time her PCP attributed this to her diaphragm pushing down as a result of her COPD. She then noticed increased swelling in her lower extremities that was worse after standing all day. She was started on Lasix without improvement and later switched to Torsemide which did help decrease the amount of swelling and abd distension. She was evaluated by Dr. Jens Som on 02/22/12 for dyspnea and edema at which time was noted to be volume overloaded with ascites and LE edema of unknown etiology. She reported having a congenital heart abnormality that was repaired in [place], but is unsure of the abnormality or procedure and denies any recurrent cardiac problems (? VSD). An Echocardiogram to assess LV and RV function was scheduled for later this month. Labs drawn during that visit revealed pBNP 441 and mildly elevated LFTs.  An abdominal ultrasound was performed revealing ascites. She also reports a "cold" over the last couple of weeks and stomach virus just 2 days prior to admission. Two days prior to presentation she noticed swelling in her right face, right arm and right breast along with mild increase in baseline dyspnea and presented to the ED for further evaluation. She has gained ~20lbs over the last month. Denies chest pain, syncope, change in bladder or bowels. Was a heavy alcohol drinker (1 pint vodka/day for ~5yrs), but  reports cutting back about 3 yrs ago and now only drinks occasional beer.   CTA chest revealed right subclavian vein thrombosis, right axillary/mediastinal/right hilar adenopathy, NO PE, small right-sided pleural effusion, right heart enlargement, small superior pericardial effusion, and ascites. She is being evaluated for possible malignancy as etiology of subclavian vein thrombosis.   Cardiology is asked to evaluate for question of CHF with her history of alcoholism and ascites. Echo 03/06/12 revealed nl LV systolic function, EF 55% to 60%, moderately dilated RV/RA. pBNP elevated at 2375.   Past Medical History  Diagnosis Date  . COPD (chronic obstructive pulmonary disease)     no documented PFTs, but patient said it was confirmed  . Chronic asthma   . History of alcoholism     sober last 5 years  . Tobacco abuse   . Irregular menses       Surgical History:  Past Surgical History  Procedure Date  . Cardiac surgery   . Lung surgery      Home Meds: Medication Sig  albuterol (PROVENTIL HFA;VENTOLIN HFA) 108 (90 BASE) MCG/ACT inhaler Inhale 2 puffs into the lungs every 6 (six) hours as needed. For shortness of breath  Fluticasone-Salmeterol (ADVAIR) 250-50 MCG/DOSE AEPB Inhale 1 puff into the lungs every 12 (twelve) hours.  ibuprofen (ADVIL,MOTRIN) 600 MG tablet Take 600 mg by mouth every 6 (six) hours as needed. For pain  PRESCRIPTION MEDICATION Take 1 tablet by mouth once. Patient states that she took one of her husbands prescribed medication for pain  torsemide (DEMADEX) 20 MG tablet Take 20 mg by mouth 2 (two) times daily.    Inpatient Medications:   .  Fluticasone-Salmeterol  1 puff Inhalation Q12H  . heparin  2,000 Units Intravenous Once  . iohexol  20 mL Oral Q1 Hr x 2  . potassium chloride  40 mEq Oral Q4H  . sodium chloride  3 mL Intravenous Q12H  . sodium chloride  3 mL Intravenous Q12H  . DISCONTD: enoxaparin  40 mg Subcutaneous Q24H   . heparin 1,400 Units/hr  (03/07/12 1000)    Allergies: No Known Allergies  Social History  . Marital Status: Married  . Years of Education: college   Occupational History  . [employer]    Social History Main Topics  . Smoking status: Current Everyday Smoker -- 1.0 packs/day for 25 years    Types: Cigarettes   Comment: quit smoking 1 week ago  . Alcohol Use: Yes     One drink per week  . Drug Use: No  . 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 Hypertension Father   . Heart disease Father     CHF  . Alcohol abuse Father     Review of Systems: General: (+) fatigue, 20lb wt gain; negative for chills, fever, night sweats  Cardiovascular: As per HPI Dermatological: negative for rash Respiratory: (+) productive cough white sputum, unchanged from baseline; negative for wheezing Urologic: negative for hematuria Abdominal: negative for nausea, vomiting, diarrhea, bright red blood per rectum, melena, or hematemesis Neurologic: negative for visual changes, syncope, or dizziness All other systems reviewed and are otherwise negative except as noted above.  Labs:  Component Value Date   WBC 11.8* 03/07/2012   HGB 15.4* 03/07/2012   HCT 49.1* 03/07/2012   MCV 97.0 03/07/2012   PLT 185 03/07/2012    Lab 03/07/12 0420 03/06/12 1346  NA 137 --  K 3.0* --  CL 87* --  CO2 40* --  BUN 24* --  CREATININE 0.66 --  CALCIUM 8.5 --  PROT -- 5.7*  BILITOT -- 1.1  ALKPHOS -- 67  ALT -- 29  AST -- 41*  GLUCOSE 113* --     03/06/2012 13:45  Pro B Natriuretic peptide (BNP) 2375.0 (H)     03/06/2012 13:46  TSH 4.231    Radiology/Studies:   03/06/12 - 2D Echocardiogram Study Conclusions: - Procedure narrative: Transthoracic echocardiography. Image quality was  suboptimal. The study was technically difficult, as a result of poor acoustic windows and poor sound wave transmission. - Left ventricle: Systolic function was normal. The estimated ejection fraction was in the range of 55% to 60%. - Ventricular septum: Septal motion showed paradox. - Right ventricle: The cavity size was moderately dilated. Systolic function was reduced. - Right atrium: The atrium was moderately dilated.  03/06/2012 - CXR  Findings: Peribronchial thickening may represent changes of chronic bronchitis.  Basilar scarring.  No segmental infiltrate or pneumothorax.  Prominence right hilar region.  This may represent overlapping vascular structures however, adenopathy or mass not excluded.  Heart size top normal.  IMPRESSION: Peribronchial thickening may represent changes of chronic bronchitis.  Prominence right hilar region.  This may represent overlapping vascular structures however, adenopathy or mass not excluded.    03/06/2012 - Ct Angio Chest W/cm &/or Wo Cm Findings: Filling defect right internal jugular vein/subclavian vein junction just proximal to  the formation of the superior vena cava involving the right subclavian vein.  Marked  right chest Hurbert Duran edema involving the right breast and right axilla.  Right axillary adenopathy.  No pulmonary embolus (probable motion artifact involving the right lower lobe pulmonary vessels).  No aortic dissection.  Mediastinal and right hilar adenopathy.  Scattered pulmonary parenchymal changes without dominant worrisome mass.  Patchy parenchymal changes most notable right upper lobe. This can be reevaluated follow-up after acute episode has cleared.  Small right-sided pleural effusion.  Right heart enlargement. Small superiorly located pericardial effusion.  No bony destructive lesion.  Ascites.  Slight lobularity liver without other findings of cirrhosis.  Mild third spacing of fluid abdominal Mahaley Schwering.  IMPRESSION: Thrombosed right subclavian vein.  Marked   right chest Jeremie Abdelaziz edema involving the right breast and right axilla.  Right axillary adenopathy.  No pulmonary embolus (probable motion artifact involving the right lower lobe pulmonary vessels).  Mediastinal and right hilar adenopathy.  Scattered pulmonary parenchymal changes without dominant worrisome mass.  Patchy parenchymal changes most notable right upper lobe. This can be reevaluated follow-up after acute episode has cleared.  Small right-sided pleural effusion.  Right heart enlargement. Small superiorly located pericardial effusion.  Ascites.  Slight lobularity liver without other findings of cirrhosis.  Mild third spacing of fluid abdominal Christi Wirick.     03/07/2012 - Ct Abdomen Pelvis W Contrast Findings:  There are small bilateral pleural effusions, right greater than left.  The mild interlobular septal thickening is noted consistent with mild interstitial edema.  There is diffuse body Gracious Renken edema identified.  Focal area of low density adjacent to the falciform ligament is identified unlike lie represents focal fatty deposition.  There is mild edema of the gallbladder Alphonso Gregson.  Non-specific in a hypoproteinemic and volume overload state.  No biliary dilatation.  The pancreas appears normal.  The spleen is negative.  Both adrenal glands are normal.  The kidneys are both unremarkable.  There are multiple upper abdominal lymph nodes but no evidence for adenopathy.  There is no pelvic or inguinal adenopathy. Intermediate density fluid within the urinary bladder is present.  This may reflect excreted contrast material from recent chest CT.  There is no pelvic or inguinal adenopathy.  The stomach and the small bowel loops appear normal.  The appendix is identified and is normal.  The colon is negative.  Moderate ascites identified within the abdomen and pelvis.  IMPRESSION:  1.  Findings compatible with fluid overload state with ascites, body Ashle Stief edema, and pleural effusions. 2.  No mass or adenopathy identified.      EKG: 02/10/2012-sinus rhythm, right axis deviation, poor R-wave progression, nonspecific T wave changes   Physical Exam: Blood pressure 114/72, pulse 81, temperature 97.8 F (36.6 C), temperature source Oral, resp. rate 19, height 5\' 2"  (1.575 m), weight 190 lb 0.6 oz (86.2 kg), last menstrual period 01/28/2012, SpO2 95.00%. General: Overweight white female in no acute distress. Head: Normocephalic, atraumatic, sclera non-icteric, no xanthomas, nares are without discharge.  Neck: (+) Swelling. Negative for carotid bruits. JVD difficult to assess. Lungs: Fine rales RLL; No wheezes or rhonchi. Breathing is unlabored. Heart: Distant heart sounds. RRR with S1 S2. No murmurs, rubs, or gallops appreciated. Abdomen: Distended, soft, Non-tender, with normoactive bowel sounds.  No rebound/guarding. No obvious abdominal masses. Msk:  Strength and tone appear normal for age. Extremities: Swelling R arm/hand, 1-2+ bilat LE to below knees. No clubbing or cyanosis. Distal pedal pulses are inact and equal bilaterally. Neuro: Alert  and oriented X 3. Moves all extremities spontaneously. Psych:  Responds to questions appropriately with a normal affect.   Assessment and Plan:   36 y.o. female w/ PMHx significant for tobacco abuse, alcoholism, and COPD who presented to [facility] on 03/06/2012 with complaints of right arm, face, and breast swelling.  1. Right sided heart failure: She has a history of an unknown congenital heart defect with repair at birth, but no further cardiac problems. Also has a history of heavy alcohol abuse and COPD. Now presents with progressive abdominal distension and LE edema and found to have ascites, dilated RA/RV on echo and right subclavian thrombosis. LV function nl w/ EF 55-60%. pBNP elevated, LFTs & TSH normal. She needs further evaluation with a right heart cath. Further plans pending results.  2. Subclavian Vein Thrombosis: Anticoagulated on heparin. Further  management per primary team  3. Hypokalemia: 3.0 this am. Supplemented. BMET in the am  Signed, HOPE, JESSICA PA-C 03/07/2012, 11:06 AM   I have taken a history, reviewed medications, allergies, PMH, SH, FH, and reviewed ROS and examined the patient.  I agree with the assessment and plan as discussed with [facility] and patient.  Bingham Millette C. Daleen Squibb, MD, [facility] Pager:  [phone]

## 2012-03-07 NOTE — CV Procedure (Signed)
    Cardiac Catheterization Operative Report  Madline Oesterling [id number] 4/2/20132:16 PM Elvina Sidle, MD, MD  Procedure Performed:  1. Right Heart Catheterization  Operator: Verne Carrow, MD  Indication: Assessment for pulmonary HTN                                Procedure Details: The risks, benefits, complications, treatment options, and expected outcomes were discussed with the patient. The patient and/or family concurred with the proposed plan, giving informed consent. The patient was brought to the cath lab and placed supine on the table.  The patient was further sedated with Versed and Fentanyl. The right groin was prepped and draped in the usual manner. Using the modified Seldinger access technique, a 6French sheath was placed in the right femoral vein.  A balloon tipped catheter was used to perform a right heart catheterization. There were no immediate complications. The patient was taken to the recovery area in stable condition.   Hemodynamic Findings: Ao:  (cuff) 111/84           LV: not performed RA:  23             RV:  59/12/25 PA:  64/45 (mean 53)       PCWP:  18 Fick Cardiac Output: 5.0 L/min Fick Cardiac Index: 2.76 L/min/m2 Peripheral Aortic Saturation: 90% Pulmonary Artery Saturation: 67%   Impression: 1. Elevated pulmonary artery pressures, right sided filling pressures.       Complications:  None; patient tolerated the procedure well.

## 2012-03-07 NOTE — Consult Note (Addendum)
PULMONARY/CCM CONSULT NOTE  Requesting MD/Service: MTSB Date of admission:  4/1 Date of consult: 4/2 Reason for consultation: Pulmonary hypertension  Pt Profile:  35 yowf admitted for increasing LE edema and increasing abdominal girth. Found by Echocardiogram and confirmed by RHC to have PAH     HPI: 31 yowf smoker (has abstained for the past 1 1/2 wks) who presented to the [facility] ED with increased DOE, LE edema, increasing abdominal girth over the past 3-4 weeks. She has abstained from cigarettes for approx 1 1/2 wks prior to admission. She has been seen as an outpt for similar complaints and was to diagnosed as having COPD and treated as such with minimal improvement. She has been evaluated with a CTA of the chest, echocardiogram and RHC, the results of which are discussed below. When I first met her, she was convalescing from RHC procedure during which she had received small doses of versed and fentanyl. She was somnolent and exhibited heavy snoring and apneas. She reports excessive daytime somnolence and her spouse acknowledges witnessed apneas which, on occasion, cause him to nudge her awake to get her to start breathing again. She reports R posterior pleuritic CP but denies hemoptysis, fever and purulent sputum.  Past Medical History  Diagnosis Date  . COPD (chronic obstructive pulmonary disease)     no documented PFTs, but patient said it was confirmed  . Chronic asthma   . History of alcoholism     sober last 5 years  . Tobacco abuse   . Irregular menses     MEDICATIONS: reviewed  History   Social History  . Marital Status: Married    Spouse Name: N/A    Number of Children: N/A  . Years of Education: college   Occupational History  .      Kennel   Social History Main Topics  . Smoking status: Current Everyday Smoker -- 1.0 packs/day for 25 years    Types: Cigarettes  . Smokeless tobacco: Not on file   Comment: quit smoking 1 week ago  . Alcohol Use: Yes     One  drink per week  . Drug Use: No  . Sexually Active: Yes -- Female partner(s)    Birth Control/ Protection: None   Other Topics Concern  . 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 Hypertension Father   . Heart disease Father     CHF  . Alcohol abuse Father     ROS - as per HPI, otherwise N/C  Filed Vitals:   03/07/12 1600 03/07/12 1615 03/07/12 1630 03/07/12 1645  BP: 133/87 125/76 133/87 132/80  Pulse: 99 100 98 95  Temp: 97.8 F (36.6 C)     TempSrc: Oral     Resp: 17 27 21 10   Height:      Weight:      SpO2: 96% 81% 84% 97%   SpO2 90-97% on 2 lpm Pony  EXAM:  Gen: plethoric, lethargic (improved after Narcan), pleasant, intelligent with intact cogntion HEENT: Hyperemic conjunctivae,  O/w WNL Neck: Redundant tissue, JVP cannot be visualized, no TM, no LAN Lungs: normal percussion note, BS mildly diminished throughout, no wheezes, no pleural friction rubs Cardiovascular: RRR s M Abdomen: obese, soft, NT, NABS Ext: 1-2+ symmetric ankle and pretibial edema, R carpal edema  Neuro: initially lethargic, improved after Narcan, no focal deficits  DATA: CBC    Component Value Date/Time   WBC 11.8* 03/07/2012 0420   RBC 5.06 03/07/2012 0420   HGB 15.4* 03/07/2012 0420   HCT 49.1* 03/07/2012 0420   PLT 185 03/07/2012 0420   MCV 97.0 03/07/2012 0420   MCH 30.4 03/07/2012 0420   MCHC 31.4 03/07/2012 0420   RDW 15.6* 03/07/2012 0420   LYMPHSABS 1.6 03/06/2012 0630   MONOABS 2.0* 03/06/2012 0630   EOSABS 0.1 03/06/2012 0630   BASOSABS 0.0 03/06/2012 0630    BMET    Component Value Date/Time   NA 137 03/07/2012 0420   K 3.0* 03/07/2012 0420   CL 87* 03/07/2012 0420   CO2 40* 03/07/2012 0420   GLUCOSE 113* 03/07/2012 0420   BUN 24* 03/07/2012 0420   CREATININE 0.66 03/07/2012 0420   CALCIUM 8.5 03/07/2012 0420    GFRNONAA >90 03/07/2012 0420   GFRAA >90 03/07/2012 0420     CXR: NAD  Echo: LV normal, RA and RV moderately dilated, RV systole mildly depressed  CT chest: With regard to the possibility pulmonary embolus, the present examination is limited by respiratory motion. Separate from the motion artifact is suggestion of a tiny filling defect within right middle lobe pulmonary branch vessel   RHC: PAP 64/45 (mean 53 mmHg)  IMPRESSION:   1) Pulmonary hypertension (multifactorial) with clinical signs and symptoms of RV failure.  - Suspect severe OSA >> History highly suggestive - possible pulmonary embolism. There might be a component of chronic VTE which can be very difficult to diagnose by CTA of chest (see revised report of CTA chest). - possible COPD - not actively wheezing - probable chronic alveolar hypoventilation (OHS) exacerbated by recent loop diuresis (Demadex) - chronic hypoxemia related to processes above and further exacerbating pulmonary hypertension  2) Metabolic alkalosis - likely secondary to Demadex. She also likely has primary resp acidosis as well which is now "masked" by the primary metabolic alkalosis. The metabolic alkalosis in essence "allows" her to hypoventilate to an even greater extent   3) Mediastinal lymphadenopathy - I am unimpressed with this and believe it is well explained by the processes listed above. I do not think this warrants any further evaluation  4) L Subclavian DVT - puzzling. I do not have a good explanation for this  PLAN/REC:  1) Acetazolamide X 1 dose ordered (with Kdur) 2) Heparin > warfarin for at least 6 months and likely lifelong depending on the status of her pulmonary hypertension after the underlying conditions are treated  3) Agree with Advair for possible COPD  4) She will need to go home with supplemental oxygen to be worn 24 hrs per day. She will need an order for a portable O2 concentrator at discharge to allow for mobility  5) Empiric  nocturnal CPAP for presumed OSA - This should be arranged for discharge pending sleep study 6) She will need a formal sleep study after discharge - I do not know how to order this in Epic. It should be ordered as a "split night study" to include CPAP titration 7) I have applauded her efforts at smoking cessation and emphasized that she must remain abstinent  8) I have emphasized the importance of weight loss in the mgmt of sleep apnea and have ordered dietary counseling to that end 9) As we work over the next several weeks and months to "get her as good as she can be" it might not be  possible for her to remain employed. I have asked for medical social worker consult to discuss the feasibility of temporary disability. 10) She will need follow up in 6-8 wks with Dr Delton Coombes [phone]) after sleep study has been performed. At that time, she should undergo a repeat Echocardiogram. If pulmonary hypertension remains severe, she should be considered for possible specific pulmonary vasodilator therapy. A decision regarding duration of warfarin therapy can be made in follow up by Dr Delton Coombes    Billy Fischer, MD;  PCCM service; Mobile [phone]

## 2012-03-07 NOTE — Progress Notes (Signed)
03/07/2012 11:27 AM Notified MD of patients ABG results, MD at bedside.  Celesta Gentile

## 2012-03-07 NOTE — Progress Notes (Signed)
03/07/2012 2:57 PM Patient arrived to 3302 from cath lab, groin site is CDI and right DP pulse is palpable. VSS. No complaints of pain. Bed is low, callbell with in reach. Will continue to monitor. Celesta Gentile

## 2012-03-07 NOTE — Progress Notes (Signed)
UR Completed.  Totiana Everson Jane [phone] 03/07/2012  

## 2012-03-07 NOTE — Progress Notes (Signed)
PGY1 Addendum I agree with excellent MS3 note above. S:  No acute events overnight.  The patient notes decreased RUE and R face swelling today.  ABG today shows significant hypercarbia, as well as low pO2.  The patient was seen by cardiology today, and tolerated cardiac cath well this afternoon, though is having episodes of apnea and desaturation with sedation from cath.    O:  General: alert, cooperative, and in no apparent distress HEENT: pupils equal round and reactive to light, vision grossly intact, oropharynx clear and non-erythematous, right face minimally edematous Neck: supple, no lymphadenopathy, unable to assess JVD due to redundant neck tissue Lungs: clear to ascultation bilaterally, normal work of respiration, no wheezes, rales, ronchi Heart: regular rate and rhythm, no murmurs, gallops, or rubs Abdomen: round, minimally tender to palpation diffusely, distended, dull to percussion Extremities: 2+ bilateral pitting edema to the level of the thigh Neurologic: alert & oriented X3, cranial nerves II-XII intact, strength grossly intact, sensation intact to light touch  I have reviewed labs and imaging.  A/P: The patient is a 36 yo woman, presenting with anasarca  # Right heart failure - with pulm HTN, seen on echo and cardiac cath.  Patient notes congenital heart disease as a child, which may play a role in current pathology.  Likely causing/contributing to anasarca/LE edema/ascites. -right heart cath today -appreciate cardiology recs going forward, will discuss future course of action in a.m.  # R subclavian DVT/R IJ DVT - unclear etiology.  CT chest/abd/pelvis shows lymphadenopathy (?reactive) but no evidence of primary malignancy. -heparin drip -cath today; start coumadin soon if no further procedures planned -consider outpatient hypercoag work-up  # Pulm HTN - seen on cardiac cath, possibly with components of OSA (given desats and apnea while sleeping post-procedure) and  primary pulmonary process (pt reports history of COPD, though this is unlikely for her age). -appreciate cardiology recs  # metabolic alkalosis/resp acidosis - likely components of contraction alkalosis from torsemide usage, and possible CO2 retention at baseline (?obesity hypoventilation syndrome).  O2 sats have improved while awake, but continue to drop while sleeping -ventimask while awake -start CPAP if patient continues to desat  # Hypokalemia - acute -supplemented  # Prophy - heparin drip  # Dispo - keep in 3300 tonight given oxygen desaturations  Signed [name], PGY1 03/07/2012 5:21 PM

## 2012-03-07 NOTE — Interval H&P Note (Signed)
History and Physical Interval Note:  03/07/2012 1:56 PM  Mary [name]  has presented today for right heart cath to exclude pulm HTN. She was seen today by cardiology and this test was ordered by Dr. Daleen Squibb.  The various methods of treatment have been discussed with the patient and family. After consideration of risks, benefits and other options for treatment, the patient has consented to  Procedure(s) (LRB): RIGHT HEART CATH (N/A) as a surgical intervention .  The patients' history has been reviewed, patient examined, no change in status, stable for surgery.  I have reviewed the patients' chart and labs.  Questions were answered to the patient's satisfaction.     [name]

## 2012-03-07 NOTE — Progress Notes (Signed)
6:18 AM    Heparin level remains subtherapeutic x2 levels for sublclavian vein thombus   No bleeding or infusion issues.   rebolus 2000 units and increase to 1700 units/hr   Recheck in 6 hours.   Janice Coffin   Pt's initial therapeutic level most likely due to generous bolus. Thus rationale for an additional bolus and 3.5 unit/kg/hr increase in rate.

## 2012-03-08 ENCOUNTER — Inpatient Hospital Stay (HOSPITAL_COMMUNITY): Payer: 59

## 2012-03-08 DIAGNOSIS — J9600 Acute respiratory failure, unspecified whether with hypoxia or hypercapnia: Secondary | ICD-10-CM

## 2012-03-08 DIAGNOSIS — E662 Morbid (severe) obesity with alveolar hypoventilation: Secondary | ICD-10-CM

## 2012-03-08 DIAGNOSIS — G4710 Hypersomnia, unspecified: Secondary | ICD-10-CM

## 2012-03-08 DIAGNOSIS — I270 Primary pulmonary hypertension: Secondary | ICD-10-CM

## 2012-03-08 DIAGNOSIS — G4730 Sleep apnea, unspecified: Secondary | ICD-10-CM

## 2012-03-08 LAB — CBC
HCT: 48.5 % — ABNORMAL HIGH (ref 36.0–46.0)
Hemoglobin: 15.1 g/dL — ABNORMAL HIGH (ref 12.0–15.0)
MCH: 30.8 pg (ref 26.0–34.0)
MCV: 99 fL (ref 78.0–100.0)
Platelets: 179 10*3/uL (ref 150–400)
RBC: 4.9 MIL/uL (ref 3.87–5.11)
WBC: 10.9 10*3/uL — ABNORMAL HIGH (ref 4.0–10.5)

## 2012-03-08 LAB — BLOOD GAS, ARTERIAL
Bicarbonate: 39.5 mEq/L — ABNORMAL HIGH (ref 20.0–24.0)
Delivery systems: POSITIVE
Expiratory PAP: 5
FIO2: 0.4 %
Mode: POSITIVE
O2 Saturation: 87.1 %
Patient temperature: 98.6
TCO2: 41.9 mmol/L (ref 0–100)
pH, Arterial: 7.332 — ABNORMAL LOW (ref 7.350–7.400)

## 2012-03-08 LAB — BASIC METABOLIC PANEL
CO2: 39 mEq/L — ABNORMAL HIGH (ref 19–32)
Calcium: 8.4 mg/dL (ref 8.4–10.5)
Chloride: 92 mEq/L — ABNORMAL LOW (ref 96–112)
Glucose, Bld: 114 mg/dL — ABNORMAL HIGH (ref 70–99)
Potassium: 3.6 mEq/L (ref 3.5–5.1)
Sodium: 139 mEq/L (ref 135–145)

## 2012-03-08 MED ORDER — ALBUTEROL SULFATE (5 MG/ML) 0.5% IN NEBU: 2.5000 mg | INHALATION_SOLUTION | RESPIRATORY_TRACT | Status: DC | PRN

## 2012-03-08 MED ORDER — IPRATROPIUM BROMIDE 0.02 % IN SOLN
0.5000 mg | Freq: Four times a day (QID) | RESPIRATORY_TRACT | Status: DC
  Administered 2012-03-08 – 2012-03-09 (×6): 0.5 mg via RESPIRATORY_TRACT
  Filled 2012-03-08 (×6): qty 2.5

## 2012-03-08 MED ORDER — ALBUTEROL SULFATE (5 MG/ML) 0.5% IN NEBU
2.5000 mg | INHALATION_SOLUTION | Freq: Four times a day (QID) | RESPIRATORY_TRACT | Status: DC
  Administered 2012-03-08 – 2012-03-09 (×6): 2.5 mg via RESPIRATORY_TRACT
  Filled 2012-03-08 (×6): qty 0.5

## 2012-03-08 MED ORDER — WARFARIN SODIUM 7.5 MG PO TABS
7.5000 mg | ORAL_TABLET | Freq: Once | ORAL | Status: AC
  Administered 2012-03-08: 7.5 mg via ORAL
  Filled 2012-03-08: qty 1

## 2012-03-08 MED ORDER — ALBUTEROL SULFATE (5 MG/ML) 0.5% IN NEBU
2.5000 mg | INHALATION_SOLUTION | Freq: Once | RESPIRATORY_TRACT | Status: AC
  Administered 2012-03-08: 2.5 mg via RESPIRATORY_TRACT

## 2012-03-08 NOTE — Progress Notes (Signed)
PGY1 Addendum I agree with excellent MS3 note above. S:  Overnight, the patient had several episodes of sedation and observed apnea, with desaturations on nasal canula.  The patient was placed on venturimask, and later on BiPap, with improvement in saturations to the 90's.  ABG showed worsening CO2 retention, and mild respiratory acidosis, with continued hypoxia.  PCCM was consulted for possible intubation, and the patient was transferred to the ICU.  The patient's CTA was also re-read yesterday, and showed a small PE in the R middle lobe.  This morning, patient notes no SOB, and is not desatting while awake.  O:  General: alert, cooperative, and in no apparent distress HEENT: pupils equal round and reactive to light, vision grossly intact, oropharynx clear and non-erythematous, right face minimally edematous Neck: supple, no lymphadenopathy, unable to assess JVD due to redundant neck tissue Lungs: clear to ascultation bilaterally, normal work of respiration, no wheezes, rales, ronchi Heart: regular rate and rhythm, no murmurs, gallops, or rubs Abdomen: round, minimally tender to palpation diffusely, distended, dull to percussion  Extremities: 2+ bilateral pitting edema to the level of the thigh Neurologic: alert & oriented X3, cranial nerves II-XII intact, strength grossly intact, sensation intact to light touch  I have reviewed labs and imaging.  A/P: The patient is a 36 yo woman, presenting with anasarca   # Right heart failure - with pulm HTN, seen on echo and cardiac cath. Patient notes congenital heart disease as a child, which may play a role in current pathology. Likely causing/contributing to anasarca/LE edema/ascites. -appreciate cardiology recs  # Emphysema/Pulm HTN - seen on cardiac cath, likely component of OSA vs OHA.  Possible component of pulmonary veno-occlusive disease. -PFT's today -continue nebulizers, advair -appreciate CCM recs; may consider outpt vasodilators -CPAP  at night  # R subclavian DVT/R IJ DVT - unclear etiology. CT chest/abd/pelvis shows lymphadenopathy (?reactive) but no evidence of primary malignancy.  -heparin drip  -continue coumadin, bridge with heparin until therapeutic -consider outpatient hypercoag work-up   # Cirrhosis - likely due to congestion from right heart failure, but also with history of alcohol abuse  # Hypokalemia - acute  -supplemented   # Prophy - heparin drip    Signed [name], PGY1 03/08/2012 5:47 PM

## 2012-03-08 NOTE — Progress Notes (Signed)
Pt becoming increasingly lethargic and arousable only to painful stimuli.  O2 Saturation dropping to 60's while on CPAP.  Respiratory and resident on call notified.  Pt placed on BiPAP.  Pt having apnea and O2 sat dropping to 84%.  MD and respiratory notified again.  ABG collected per MD order.  MD made aware of results.  CCM consulted.  Pt maintaining O2 saturation 90% and above on BiPAP currently.  Still difficult to arouse.  Will continue to monitor.   Dionisio Paschal RN

## 2012-03-08 NOTE — Progress Notes (Signed)
Report called and pt transferred to 2110.  Family aware. Dionisio Paschal RN

## 2012-03-08 NOTE — Progress Notes (Signed)
ANTICOAGULATION CONSULT NOTE - Follow Up Consult  Pharmacy Consult for heparin Indication: subclavian vein thrombosis  No Known Allergies  Patient Measurements: Height: 5\' 2"  (157.5 cm) Weight: 192 lb 14.4 oz (87.5 kg) IBW/kg (Calculated) : 50.1  Heparin Dosing Weight: 70kg  Vital Signs: Temp: 97.2 F (36.2 C) (04/03 0400) Temp src: Oral (04/03 0400) BP: 105/71 mmHg (04/03 1100) Pulse Rate: 84  (04/03 1100)  Labs:  Basename 03/08/12 0840 03/08/12 0106 03/07/12 0420 03/06/12 0725 03/06/12 0630  HGB -- 15.1* 15.4* -- --  HCT -- 48.5* 49.1* -- 50.4*  PLT -- 179 185 -- 192  APTT -- -- -- 29 --  LABPROT -- 14.2 -- 16.2* --  INR -- 1.08 -- 1.27 --  HEPARINUNFRC 0.55 0.35 0.24* -- --  CREATININE -- 0.60 0.66 -- 1.08  CKTOTAL -- -- -- -- --  CKMB -- -- -- -- --  TROPONINI -- -- -- -- --   Estimated Creatinine Clearance: 101.8 ml/min (by C-G formula based on Cr of 0.6).   Medications:  Infusions:     . heparin 1,700 Units/hr (03/08/12 0600)  . DISCONTD: heparin 1,400 Units/hr (03/07/12 1200)    Assessment: 36 yo F on IV heparin for subclavian vein thrombosis bridging to coumadin.  Heparin restarted last night s/p sheath removal. Heparin level is therapeutic at 0.55. No bleeding noted. Hbg stable at 15.1 and plts at 179. INR is subtherapeutic as expected after 1 dose last pm  Goal of Therapy:  Heparin level 0.3-0.7 units/ml  INR 2-3  Plan:  1. Continue heparin at 1700 units/hr  2. Check daily heparin level and CBC 3. Coumadin 7.5 mg po x 1 dose tonight 4. F/u INR in AM  [facility], Pharm.D. Pager: [phone] 03/08/12 12:27pm
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
## 2012-03-08 NOTE — Plan of Care (Signed)
Problem: Food- and Nutrition-Related Knowledge Deficit (NB-1.1) Goal: Nutrition education Formal process to instruct or train a patient/client in a skill or to impart knowledge to help patients/clients voluntarily manage or modify food choices and eating behavior to maintain or improve health.  Outcome: Completed/Met Date Met:  03/08/12 Reviewed tips for weight loss with patient.  Provided "weight loss tips" and "cooking tips for weight management" handouts to patient.  Discussed eating more fresh fruits and vegetables, less fried and processed high-calorie foods. Patient was receptive, but overwhelmed with results of PFT this morning (per RN).  Recommend outpatient weight loss education after discharge home--MD please make [facility] to [facility].   Mary [name] [phone]

## 2012-03-08 NOTE — Progress Notes (Signed)
Medical Student Daily Progress Note  Subjective: 36 yo female patient with history of COPD, alcohol abuse, tobacco abuse, and unknown cardiac surgery at birth. She reports her swelling in her right arm has increased over the past 24 hours and her arm band was indented into her skin this morning supporting her statement. Patient also reports sleeping well last night with BiPAP. She did not tolerate the nasal CPAP well. Currently she is laying comfortably in bed breathing 2L oxygen on cannula. She is very concerned about her condition and informing her about her tests and procedures seems to reassure her. Objective: Vital signs in last 24 hours: Filed Vitals:   03/08/12 0900 03/08/12 1000 03/08/12 1048 03/08/12 1100  BP: 111/77 103/68  105/71  Pulse: 90 86  84  Temp:      TempSrc:      Resp: 22 19  19   Height:      Weight:      SpO2: 90% 94% 99% 93%   Weight change: 1.3 kg (2 lb 13.9 oz)  Intake/Output Summary (Last 24 hours) at 03/08/12 1154 Last data filed at 03/08/12 1100  Gross per 24 hour  Intake    503 ml  Output   2750 ml  Net  -2247 ml   Physical Exam: BP 105/71  Pulse 84  Temp(Src) 97.2 F (36.2 C) (Oral)  Resp 19  Ht 5\' 2"  (1.575 m)  Wt 87.5 kg (192 lb 14.4 oz)  BMI 35.28 kg/m2  SpO2 93%  LMP 01/28/2012 General appearance: alert, cooperative, mild distress and moderately obese Head: Normocephalic, without obvious abnormality, atraumatic Eyes: conjunctivae/corneas clear. PERRL, EOM's intact. Fundi benign. Nose: Nares normal. Septum midline. Mucosa normal. No drainage or sinus tenderness. Neck: no adenopathy, no carotid bruit, supple, symmetrical, trachea midline, thyroid not enlarged, symmetric, no tenderness/mass/nodules and unable to appreciate JVD Back: symmetric, no curvature. ROM normal. No CVA tenderness. Lungs: clear to auscultation bilaterally and weak air flow Heart: regular rate and rhythm and unable to appreciate any murmurs Abdomen: soft, non-tender;  bowel sounds normal; no masses,  no organomegaly and presence of moderate ascites Extremities: edema right arm and B/L LE Skin: temperature normal, texture normal, vascularity normal and edematous Lab Results: Basic Metabolic Panel:  Lab 03/08/12 4696 03/07/12 0420 03/06/12 1346  NA 139 137 --  K 3.6 3.0* --  CL 92* 87* --  CO2 39* 40* --  GLUCOSE 114* 113* --  BUN 13 24* --  CREATININE 0.60 0.66 --  CALCIUM 8.4 8.5 --  MG -- -- 1.7  PHOS -- -- --   Liver Function Tests:  Lab 03/06/12 1346  AST 41*  ALT 29  ALKPHOS 67  BILITOT 1.1  PROT 5.7*  ALBUMIN 2.8*   No results found for this basename: LIPASE:2,AMYLASE:2 in the last 168 hours No results found for this basename: AMMONIA:2 in the last 168 hours CBC:  Lab 03/08/12 0106 03/07/12 0420 03/06/12 0630  WBC 10.9* 11.8* --  NEUTROABS -- -- 9.4*  HGB 15.1* 15.4* --  HCT 48.5* 49.1* --  MCV 99.0 97.0 --  PLT 179 185 --   Cardiac Enzymes: No results found for this basename: CKTOTAL:3,CKMB:3,CKMBINDEX:3,TROPONINI:3 in the last 168 hours BNP:  Lab 03/06/12 1345  PROBNP 2375.0*   D-Dimer: No results found for this basename: DDIMER:2 in the last 168 hours CBG: No results found for this basename: GLUCAP:6 in the last 168 hours Hemoglobin A1C: No results found for this basename: HGBA1C in the last 168 hours Fasting  Lipid Panel: No results found for this basename: CHOL,HDL,LDLCALC,TRIG,CHOLHDL,LDLDIRECT in the last 161 hours Thyroid Function Tests:  Lab 03/06/12 1346  TSH 4.231  T4TOTAL --  FREET4 --  T3FREE --  THYROIDAB --   Coagulation:  Lab 03/08/12 0106 03/06/12 0725  LABPROT 14.2 16.2*  INR 1.08 1.27   Anemia Panel: No results found for this basename: VITAMINB12,FOLATE,FERRITIN,TIBC,IRON,RETICCTPCT in the last 168 hours Urine Drug Screen: Drugs of Abuse  No results found for this basename: labopia, cocainscrnur, labbenz, amphetmu, thcu, labbarb    Alcohol Level: No results found for this basename:  ETH:2 in the last 168 hours Urinalysis:  Lab 03/06/12 1714  COLORURINE YELLOW  LABSPEC 1.039*  PHURINE 5.5  GLUCOSEU NEGATIVE  HGBUR NEGATIVE  BILIRUBINUR NEGATIVE  KETONESUR 15*  PROTEINUR NEGATIVE  UROBILINOGEN 1.0  NITRITE NEGATIVE  LEUKOCYTESUR NEGATIVE   Misc. Labs: Alpha-1 antitrypsin: 340 mg/dL (H)  (09-$[id number]$$[id number]$ /dL)  Micro Results: Recent Results (from the past 240 hour(s))  MRSA PCR SCREENING     Status: Normal   Collection Time   03/06/12  4:23 PM      Component Value Range Status Comment   MRSA by PCR NEGATIVE  NEGATIVE  Final    Studies/Results: Ct Abdomen Pelvis W Contrast  03/07/2012  *RADIOLOGY REPORT*  Clinical Data: Right-sided edema and adenopathy  CT ABDOMEN AND PELVIS WITH CONTRAST  Technique:  Multidetector CT imaging of the abdomen and pelvis was performed following the standard protocol during bolus administration of intravenous contrast.  Contrast:  80 ml of omni 300  Comparison: None.  Findings:  There are small bilateral pleural effusions, right greater than left.  The mild interlobular septal thickening is noted consistent with mild interstitial edema.  There is diffuse body wall edema identified.  Focal area of low density adjacent to the falciform ligament is identified unlike lie represents focal fatty deposition.  There is mild edema of the gallbladder wall.  Non-specific in a hypoproteinemic and volume overload state.  No biliary dilatation.  The pancreas appears normal.  The spleen is negative.  Both adrenal glands are normal.  The kidneys are both unremarkable.  There are multiple upper abdominal lymph nodes but no evidence for adenopathy.  There is no pelvic or inguinal adenopathy. Intermediate density fluid within the urinary bladder is present.  This may reflect excreted contrast material from recent chest CT.  There is no pelvic or inguinal adenopathy.  The stomach and the small bowel loops appear normal.  The appendix is identified and is normal.  The  colon is negative.  Moderate ascites identified within the abdomen and pelvis.  IMPRESSION:  1.  Findings compatible with fluid overload state with ascites, body wall edema, and pleural effusions. 2.  No mass or adenopathy identified.  Original Report Authenticated By: Rosealee Albee, M.D.   Medications:  I have reviewed the patient's current medications. Prior to Admission:  Prescriptions prior to admission  Medication Sig Dispense Refill  . albuterol (PROVENTIL HFA;VENTOLIN HFA) 108 (90 BASE) MCG/ACT inhaler Inhale 2 puffs into the lungs every 6 (six) hours as needed. For shortness of breath      . Fluticasone-Salmeterol (ADVAIR) 250-50 MCG/DOSE AEPB Inhale 1 puff into the lungs every 12 (twelve) hours.      Marland Kitchen ibuprofen (ADVIL,MOTRIN) 600 MG tablet Take 600 mg by mouth every 6 (six) hours as needed. For pain      . PRESCRIPTION MEDICATION Take 1 tablet by mouth once. Patient states that she took one of her husbands  prescribed medication for pain      . torsemide (DEMADEX) 20 MG tablet Take 20 mg by mouth 2 (two) times daily.       Scheduled:   . acetaZOLAMIDE  500 mg Intravenous Once  . albuterol  2.5 mg Nebulization Once  . albuterol  2.5 mg Nebulization Q6H  . coumadin book   Does not apply Once  . diazepam  5 mg Oral On Call  . fentaNYL      . Fluticasone-Salmeterol  1 puff Inhalation Q12H  . heparin      . ipratropium  0.5 mg Nebulization Q6H  . lidocaine      . midazolam      . naloxone      . potassium chloride SA      . potassium chloride  40 mEq Oral Q4H  . potassium chloride  40 mEq Oral Once  . sodium chloride  3 mL Intravenous Q12H  . sodium chloride  3 mL Intravenous Q12H  . sodium chloride      . warfarin  7.5 mg Oral Once  . warfarin   Does not apply Once  . Warfarin - Pharmacist Dosing Inpatient   Does not apply q1800   Continuous:   . heparin 1,700 Units/hr (03/08/12 0600)  . DISCONTD: heparin 1,400 Units/hr (03/07/12 1200)   AOZ:HYQMVHQIONGEX,  acetaminophen, albuterol, ondansetron (ZOFRAN) IV, ondansetron, sodium chloride, DISCONTD: albuterol, DISCONTD: naloxone (NARCAN) injection Scheduled Meds:   . acetaZOLAMIDE  500 mg Intravenous Once  . albuterol  2.5 mg Nebulization Once  . albuterol  2.5 mg Nebulization Q6H  . coumadin book   Does not apply Once  . diazepam  5 mg Oral On Call  . fentaNYL      . Fluticasone-Salmeterol  1 puff Inhalation Q12H  . heparin      . ipratropium  0.5 mg Nebulization Q6H  . lidocaine      . midazolam      . naloxone      . potassium chloride SA      . potassium chloride  40 mEq Oral Q4H  . potassium chloride  40 mEq Oral Once  . sodium chloride  3 mL Intravenous Q12H  . sodium chloride  3 mL Intravenous Q12H  . sodium chloride      . warfarin  7.5 mg Oral Once  . warfarin   Does not apply Once  . Warfarin - Pharmacist Dosing Inpatient   Does not apply q1800   Continuous Infusions:   . heparin 1,700 Units/hr (03/08/12 0600)  . DISCONTD: heparin 1,400 Units/hr (03/07/12 1200)   PRN Meds:.acetaminophen, acetaminophen, albuterol, ondansetron (ZOFRAN) IV, ondansetron, sodium chloride, DISCONTD: albuterol, DISCONTD: naloxone (NARCAN) injection Assessment/Plan:  Anasarca: generalized edema in right arm, and both lower extremities continues. 2 lb weight gain within 24 hours, worsening right arm edema.  DVT upper extremity (subclavian vein thrombosis): Patient has been on heparin for 3 days and today she is starting to bridge with warfarin.  COPD: Her breathing became worse yesterday evening with a desaturation of 60's on CPAP. Her sats came back up on BiPAP overnight and allowed her to sleep well. This morning she is back on 2L [place] and tolerating well. (93-95%). Continuing her Advair and nebs.  Pulmonary Hypertension: Patient had a PFT ordered for this morning and results are pending. She also had a right heart cath yesterday with results indicating a mean PA pressure of 53 and increased RV  pressures as well. Will continue her  oxygen for now and possibly recommend her to start BiPAP for sleeping secondary to her Pickwickian syndrome.  Abdominal distension: Still presence of ascites in her abdomen. Radiology reports a slight lobularity of her liver without other findings of cirrhosis.  Bilateral LE edema: continued pitting edema in both her thighs and legs, will continue to monitor her daily weight.  Axillary lymphadenopathy: benign reactive adenopathy due to poor drainage from axilla.  Disposition: PFT's pending, she will most likely transfer out of the ICU today back to a step down bed and her oxygen and breathing will continue to be monitored closely.    LOS: 2 days   This is a Psychologist, occupational Note.  The care of the patient was discussed with Dr. [name] and the assessment and plan formulated with their assistance.  Please see their attached note for official documentation of the daily encounter.  Lewie Chamber 03/08/2012, 11:54 AM

## 2012-03-08 NOTE — Progress Notes (Signed)
  PCCM PROGRESS NOTE  Asked to evaluate respiratory status by [facility] physician.  Bedside nurse reports borderline SpO2 while on BiPAP and readings as low as 60% when BiPAP mask comes off.  Also reportedly appears difficult to arouse.  Temp:  [97.2 F (36.2 C)-98.8 F (37.1 C)] 97.2 F (36.2 C) (04/03 0400) Pulse Rate:  [81-103] 87  (04/03 0406) Resp:  [10-27] 17  (04/03 0406) BP: (103-133)/(63-87) 111/70 mmHg (04/03 0400) SpO2:  [81 %-98 %] 94 % (04/03 0406) FiO2 (%):  [50 %] 50 % (04/02 1645) Weight:  [87.5 kg (192 lb 14.4 oz)] 87.5 kg (192 lb 14.4 oz) (04/03 0400)  Does not appear to be in respiratory distress currently saturating 93% on BiPAP Sleepy, but arouses to stimulation and follows commands Bilateral air movement upon auscultation, no wheezing / rales Distant heart sounds, regular heart rate Anasarca  Lab 03/08/12 0320 03/07/12 1417 03/07/12 1114  PHART 7.332* -- 7.443*  PCO2ART 76.7* -- 64.0*  PO2ART 55.3* -- 51.0*  HCO3 39.5* 39.1* 43.3*  TCO2 41.9 41 45.3  O2SAT 87.1 67.0 88.8   Right heart catheterization results and chest CT angio results reviewed.  A:  Hypercarbic hypoxemic respiratory failure, on NIMV (BiPAP) Pulmonary arterial hypertension (65/45 (mean 53)), not sure if vasodilators were tested.  Possibility of pulmonary veno-occlusive disease (PVOD) given pulmonary hypertension, thrombotic diathesis, pleural effusion, patchy parenchymal changes and significant mediastinal lymphadenopathy.  Right sided congestive heart failure Left subclavian DVT without PE, on anticoagulation Reported reactive airway disease, no PFT available Reported congenital heart disease, heart surgery as infant, no details available Suspected OSA / OHS  P:  -->  No indications for emergent intubation, though it remains a distinct possibility in the near future.  Would continue BiPAP, move to ICU level of care and maintain NPO. -->  Should hypoxemia worsen would consider temporizing  with nitric oxide -->  Would consider transfer to a facility capable of initiating intravenous vasodilator therapy for pulmonary hypertension as well as heart/lung transplant evaluation -->  Continue anticoagulation -->  Would discuss trial of oral vasodilators with Cardiology  The patient is critically ill with multiple organ systems failure and requires high complexity decision making for assessment and support, frequent evaluation and titration of therapies, application of advanced monitoring technologies and extensive interpretation of multiple databases. Critical Care Time devoted to patient care services described in this note is 40 minutes.  Orlean Bradford, M.D., P.C.C.M. Pulmonary and Critical Care Medicine [facility] Cell: [phone] Pager: [phone]

## 2012-03-08 NOTE — Progress Notes (Signed)
ANTICOAGULATION CONSULT NOTE - Follow Up Consult  Pharmacy Consult for heparin Indication: subclavian vein thrombosis  No Known Allergies  Patient Measurements: Height: 5\' 2"  (157.5 cm) Weight: 190 lb 0.6 oz (86.2 kg) IBW/kg (Calculated) : 50.1  Heparin Dosing Weight: 70kg  Vital Signs: Temp: 98.8 F (37.1 C) (04/03 0000) Temp src: Oral (04/03 0000) BP: 105/71 mmHg (04/03 0000) Pulse Rate: 87  (04/03 0013)  Labs:  Basename 03/08/12 0106 03/07/12 0420 03/06/12 2019 03/06/12 0725 03/06/12 0630  HGB 15.1* 15.4* -- -- --  HCT 48.5* 49.1* -- -- 50.4*  PLT 179 185 -- -- 192  APTT -- -- -- 29 --  LABPROT 14.2 -- -- 16.2* --  INR 1.08 -- -- 1.27 --  HEPARINUNFRC 0.35 0.24* 0.22* -- --  CREATININE -- 0.66 -- -- 1.08  CKTOTAL -- -- -- -- --  CKMB -- -- -- -- --  TROPONINI -- -- -- -- --   Estimated Creatinine Clearance: 100.9 ml/min (by C-G formula based on Cr of 0.66).   Medications:  Infusions:     . heparin 1,700 Units/hr (03/08/12 0000)  . DISCONTD: heparin 1,400 Units/hr (03/07/12 1200)    Assessment: 35 yof on IV heparin for subclavian vein thrombosis.  Heparin was turned off this afternoon for emergent right heart cath, then restarted 4 hours post sheath removal, sheath was pulled at 1430.  No bleeding noted.  Heparin level currently at goal with heparin at 1700 units/hr.  Goal of Therapy:  Heparin level 0.3-0.7 units/ml   Plan:  1. Continue heparin at 1700 units/hr 2. Check daily heparin level and CBC   Hawthorne Day L. Illene Bolus, PharmD, BCPS Clinical Pharmacist Pager: [phone] 03/08/2012 1:59 AM

## 2012-03-09 DIAGNOSIS — R0609 Other forms of dyspnea: Secondary | ICD-10-CM

## 2012-03-09 LAB — CBC
HCT: 54.3 % — ABNORMAL HIGH (ref 36.0–46.0)
Hemoglobin: 16.5 g/dL — ABNORMAL HIGH (ref 12.0–15.0)
MCV: 98.9 fL (ref 78.0–100.0)
WBC: 9.6 10*3/uL (ref 4.0–10.5)

## 2012-03-09 LAB — BASIC METABOLIC PANEL
BUN: 11 mg/dL (ref 6–23)
CO2: 32 mEq/L (ref 19–32)
Glucose, Bld: 92 mg/dL (ref 70–99)
Potassium: 3.8 mEq/L (ref 3.5–5.1)
Sodium: 135 mEq/L (ref 135–145)

## 2012-03-09 LAB — PROTIME-INR: INR: 1.32 (ref 0.00–1.49)

## 2012-03-09 MED ORDER — IPRATROPIUM-ALBUTEROL 18-103 MCG/ACT IN AERO
2.0000 | INHALATION_SPRAY | Freq: Three times a day (TID) | RESPIRATORY_TRACT | Status: DC
  Administered 2012-03-10 (×3): 2 via RESPIRATORY_TRACT
  Filled 2012-03-09: qty 14.7

## 2012-03-09 MED ORDER — WARFARIN SODIUM 7.5 MG PO TABS
7.5000 mg | ORAL_TABLET | Freq: Once | ORAL | Status: AC
  Administered 2012-03-09: 7.5 mg via ORAL
  Filled 2012-03-09: qty 1

## 2012-03-09 NOTE — Progress Notes (Signed)
PGY1 Addendum I agree with excellent MS3 note above. S:  The patient had no further oxygen desaturations overnight, and reports that she slept well with BiPap.  Her RUE swelling is decreasing.  She notes no new complaints.  O:  General: alert, cooperative, and in no apparent distress HEENT: pupils equal round and reactive to light, vision grossly intact, oropharynx clear and non-erythematous, right face minimally edematous Neck: supple, no lymphadenopathy, unable to assess JVD due to redundant neck tissue Lungs: clear to ascultation bilaterally, normal work of respiration, no wheezes, rales, ronchi Heart: regular rate and rhythm, no murmurs, gallops, or rubs Abdomen: round, minimally tender to palpation diffusely, distended, dull to percussion  Extremities: 2+ bilateral pitting edema to the level of the thigh Neurologic: alert & oriented X3, cranial nerves II-XII intact, strength grossly intact, sensation intact to light touch  I have reviewed labs and imaging.  A/P: The patient is a 36 yo woman, presenting with anasarca   # Right heart failure - with pulm HTN, seen on echo and cardiac cath. Patient notes congenital heart disease as a child, which may play a role in current pathology. Likely causing/contributing to anasarca/LE edema/ascites.  -appreciate cardiology recs   # Emphysema/Pulm HTN - seen on cardiac cath, likely component of OSA vs OHA. Possible component of pulmonary veno-occlusive disease.  -continue nebulizers, advair  -appreciate CCM recs; may consider outpt vasodilators  -CPAP at night  -outpatient pulm follow-up  # R subclavian DVT/R IJ DVT - unclear etiology. CT chest/abd/pelvis shows lymphadenopathy (?reactive) but no evidence of primary malignancy.  -heparin drip  -continue coumadin, bridge with heparin until therapeutic  -consider outpatient hypercoag work-up   # Cirrhosis - likely due to congestion from right heart failure, but also with history of alcohol  abuse   # Hypokalemia - acute  -supplemented   # Prophy - heparin drip    Signed [name], PGY1 03/09/2012 1:23 PM

## 2012-03-09 NOTE — Progress Notes (Signed)
Clinical Social Work Department BRIEF PSYCHOSOCIAL ASSESSMENT 03/09/2012  Patient:  Mary [name], Mary [name]     Account Number:  [id number]     Admit date:  03/06/2012  Clinical Social Worker:  Lourdes Sledge  Date/Time:  03/09/2012 01:54 PM  Referred by:  Physician  Date Referred:  03/09/2012 Referred for  Other - See comment   Other Referral:   Short term disability questions   Interview type:  Patient Other interview type:    PSYCHOSOCIAL DATA Living Status:  HUSBAND Admitted from facility:   Level of care:   Primary support name:  Margo Common Primary support relationship to patient:  SPOUSE Degree of support available:   Pt spouse is main support system along with pt friends.    CURRENT CONCERNS Current Concerns  Other - See comment   Other Concerns:   How to obtain short term disability    SOCIAL WORK ASSESSMENT / PLAN CSW received referral to assist pt in obtaining short term disability. CSW visited pt room and explored pt needs. Pt states she was informed by MD that she may need to take time off work prior to returning. 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 CSW informed pt that she needs to speak to [employer] at her place of employment and inform them that she will medically need time off prior to returning to work. The HR dept at her job should then provide her with disability paperwork that she and her PCP will need to complete. CSW explained the process a few times to pt to confirm she understood. Pt appreciative of CSW assistance and stated she had no further questions.   Assessment/plan status:  No Further Intervention Required Other assessment/ plan:   Information/referral to community resources:   CSW informed pt how to proceed with applying for short term disability with her employment.    PATIENT'S/FAMILY'S RESPONSE TO PLAN OF CARE: Pt was resting in bed, appeared time however was alert and oriented and pleasant to speak to. Pt stated she did not think she  could return to work as soon as she had hoped for. Pt stated she understood the process for getting short term disability and would contact her work in the next few days. Pt denied having additional questions. CSW is signing off.   Theresia Bough, MSW, Theresia Majors [phone]

## 2012-03-09 NOTE — Progress Notes (Signed)
Medical Student Daily Progress Note  Subjective: Patient is a very pleasant 36 yo female, hx of COPD, alcohol abuse, tobacco abuse. She reports sleeping much better at night with her BiPAP likely to the alleviation of her OSA. She reports the swelling in her legs is decreased a little and she is feeling much better and in high spirits. She does still report desaturations sometimes while lying in bed with her oxygen flowing at 2L [place], but no other complaints. She is very active in her care and is very interested in understanding her problems.  Objective: Vital signs in last 24 hours: Filed Vitals:   03/09/12 0834 03/09/12 0900 03/09/12 1000 03/09/12 1100  BP:  105/70 116/59 103/64  Pulse:  92 98 92  Temp:      TempSrc:      Resp:  22 20 20   Height:      Weight:      SpO2: 99% 94% 94% 97%   Weight change: 2.3 kg (5 lb 1.1 oz)  Intake/Output Summary (Last 24 hours) at 03/09/12 1156 Last data filed at 03/09/12 1100  Gross per 24 hour  Intake   1103 ml  Output   1751 ml  Net   -648 ml   Physical Exam: BP 103/64  Pulse 92  Temp(Src) 98.1 F (36.7 C) (Oral)  Resp 20  Ht 5\' 2"  (1.575 m)  Wt 89.8 kg (197 lb 15.6 oz)  BMI 36.21 kg/m2  SpO2 97%  LMP 01/28/2012 General appearance: alert, cooperative, no distress and moderately obese Head: Normocephalic, without obvious abnormality, atraumatic Eyes: conjunctivae/corneas clear. PERRL, EOM's intact. Fundi benign. Neck: no adenopathy, no carotid bruit, supple, symmetrical, trachea midline, thyroid not enlarged, symmetric, no tenderness/mass/nodules and unable to appreciate JVD Back: symmetric, no curvature. ROM normal. No CVA tenderness. Lungs: clear to auscultation bilaterally Heart: regular rate and rhythm Abdomen: soft, non-tender; bowel sounds normal; no masses,  no organomegaly Extremities: still moderate edema B/L LE and right arm Skin: Skin color, texture, turgor normal. No rashes or lesions Neurologic: Grossly normal Lab  Results: Basic Metabolic Panel:  Lab 03/09/12 1950 03/08/12 0106 03/06/12 1346  NA 135 139 --  K 3.8 3.6 --  CL 95* 92* --  CO2 32 39* --  GLUCOSE 92 114* --  BUN 11 13 --  CREATININE 0.53 0.60 --  CALCIUM 9.1 8.4 --  MG -- -- 1.7  PHOS -- -- --   Liver Function Tests:  Lab 03/06/12 1346  AST 41*  ALT 29  ALKPHOS 67  BILITOT 1.1  PROT 5.7*  ALBUMIN 2.8*   No results found for this basename: LIPASE:2,AMYLASE:2 in the last 168 hours No results found for this basename: AMMONIA:2 in the last 168 hours CBC:  Lab 03/09/12 0410 03/08/12 0106 03/06/12 0630  WBC 9.6 10.9* --  NEUTROABS -- -- 9.4*  HGB 16.5* 15.1* --  HCT 54.3* 48.5* --  MCV 98.9 99.0 --  PLT 172 179 --   Cardiac Enzymes: No results found for this basename: CKTOTAL:3,CKMB:3,CKMBINDEX:3,TROPONINI:3 in the last 168 hours BNP:  Lab 03/06/12 1345  PROBNP 2375.0*   D-Dimer: No results found for this basename: DDIMER:2 in the last 168 hours CBG: No results found for this basename: GLUCAP:6 in the last 168 hours Hemoglobin A1C: No results found for this basename: HGBA1C in the last 168 hours Fasting Lipid Panel: No results found for this basename: CHOL,HDL,LDLCALC,TRIG,CHOLHDL,LDLDIRECT in the last 932 hours Thyroid Function Tests:  Lab 03/06/12 1346  TSH 4.231  T4TOTAL --  FREET4 --  T3FREE --  THYROIDAB --   Coagulation:  Lab 03/09/12 0410 03/08/12 0106 03/06/12 0725  LABPROT 16.6* 14.2 16.2*  INR 1.32 1.08 1.27   Anemia Panel: No results found for this basename: VITAMINB12,FOLATE,FERRITIN,TIBC,IRON,RETICCTPCT in the last 168 hours Urine Drug Screen: Drugs of Abuse  No results found for this basename: labopia, cocainscrnur, labbenz, amphetmu, thcu, labbarb    Alcohol Level: No results found for this basename: ETH:2 in the last 168 hours Urinalysis:  Lab 03/06/12 1714  COLORURINE YELLOW  LABSPEC 1.039*  PHURINE 5.5  GLUCOSEU NEGATIVE  HGBUR NEGATIVE  BILIRUBINUR NEGATIVE  KETONESUR  15*  PROTEINUR NEGATIVE  UROBILINOGEN 1.0  NITRITE NEGATIVE  LEUKOCYTESUR NEGATIVE   Misc. Labs: none  Micro Results: Recent Results (from the past 240 hour(s))  MRSA PCR SCREENING     Status: Normal   Collection Time   03/06/12  4:23 PM      Component Value Range Status Comment   MRSA by PCR NEGATIVE  NEGATIVE  Final    Studies/Results: No results found. Medications:  I have reviewed the patient's current medications. Prior to Admission:  Prescriptions prior to admission  Medication Sig Dispense Refill  . albuterol (PROVENTIL HFA;VENTOLIN HFA) 108 (90 BASE) MCG/ACT inhaler Inhale 2 puffs into the lungs every 6 (six) hours as needed. For shortness of breath      . Fluticasone-Salmeterol (ADVAIR) 250-50 MCG/DOSE AEPB Inhale 1 puff into the lungs every 12 (twelve) hours.      Marland Kitchen ibuprofen (ADVIL,MOTRIN) 600 MG tablet Take 600 mg by mouth every 6 (six) hours as needed. For pain      . PRESCRIPTION MEDICATION Take 1 tablet by mouth once. Patient states that she took one of her husbands prescribed medication for pain      . torsemide (DEMADEX) 20 MG tablet Take 20 mg by mouth 2 (two) times daily.       Scheduled Meds:   . albuterol  2.5 mg Nebulization Q6H  . Fluticasone-Salmeterol  1 puff Inhalation Q12H  . ipratropium  0.5 mg Nebulization Q6H  . sodium chloride  3 mL Intravenous Q12H  . sodium chloride  3 mL Intravenous Q12H  . warfarin  7.5 mg Oral ONCE-1800  . warfarin  7.5 mg Oral ONCE-1800  . Warfarin - Pharmacist Dosing Inpatient   Does not apply q1800   Continuous Infusions:   . heparin 1,650 Units/hr (03/09/12 0850)   PRN Meds:.acetaminophen, acetaminophen, albuterol, ondansetron (ZOFRAN) IV, ondansetron, sodium chloride Assessment/Plan:  Anasarca: edema in right arm and lower extremities has improved a little over the past 24 hours, despite another 5 lb weight gain since yesterday  Right Heart failure: secondary to pulmonary hypertension. Her anasarca is explained  heavily by this.  Pulmonary HTN: PFT's were cancelled from yesterday. She is responding well to daily oxygen and nightly CPAP. She is in no apparent distress.  DVT upper extremity (R subclavian vein): heparin therapy continues, as well as bridging with warfarin. Likely she will be discharged home with warfarin for 3-6 months when that time comes.  Cirrhosis: Most likely a component of her right heart failure and past history of alcohol abuse.    LOS: 3 days   This is a Psychologist, occupational Note.  The care of the patient was discussed with Dr. [name] and the assessment and plan formulated with their assistance.  Please see their attached note for official documentation of the daily encounter.  Lewie Chamber 03/09/2012, 11:56 AM

## 2012-03-09 NOTE — Progress Notes (Signed)
Subjective:   Mary [name] is a 36 yo with hx of COPD, ETOH, right subclavian vein thrombosis, pulmonary htn,  Right heart cath yesterday shows elevated pulmonary pressures.  Nl cardiac output.  She is feeling better this am.      . albuterol  2.5 mg Nebulization Q6H  . Fluticasone-Salmeterol  1 puff Inhalation Q12H  . ipratropium  0.5 mg Nebulization Q6H  . sodium chloride  3 mL Intravenous Q12H  . sodium chloride  3 mL Intravenous Q12H  . warfarin  7.5 mg Oral ONCE-1800  . warfarin  7.5 mg Oral ONCE-1800  . Warfarin - Pharmacist Dosing Inpatient   Does not apply q1800      . heparin 1,650 Units/hr (03/09/12 0850)    Objective:  Vital Signs in the last 24 hours: Blood pressure 105/70, pulse 92, temperature 97.9 F (36.6 C), temperature source Oral, resp. rate 22, height 5\' 2"  (1.575 m), weight 197 lb 15.6 oz (89.8 kg), last menstrual period 01/28/2012, SpO2 94.00%. Temp:  [97.2 F (36.2 C)-97.9 F (36.6 C)] 97.9 F (36.6 C) (04/04 0804) Pulse Rate:  [80-100] 92  (04/04 0900) Resp:  [10-23] 22  (04/04 0900) BP: (98-117)/(55-87) 105/70 mmHg (04/04 0900) SpO2:  [85 %-100 %] 94 % (04/04 0900) FiO2 (%):  [30 %] 30 % (04/04 0215) Weight:  [197 lb 15.6 oz (89.8 kg)] 197 lb 15.6 oz (89.8 kg) (04/04 0500)  Intake/Output from previous day: 04/03 0701 - 04/04 0700 In: 1048 [P.O.:640; I.V.:408] Out: 1551 [Urine:1551] Intake/Output from this shift: Total I/O In: 273.5 [P.O.:240; I.V.:33.5] Out: 500 [Urine:500]  Physical Exam:  Physical Exam: Blood pressure 105/70, pulse 92, temperature 97.9 F (36.6 C), temperature source Oral, resp. rate 22, height 5\' 2"  (1.575 m), weight 197 lb 15.6 oz (89.8 kg), last menstrual period 01/28/2012, SpO2 94.00%. General: Well developed, well nourished, in no acute distress. Head: Normocephalic, atraumatic, sclera non-icteric, mucus membranes are moist,  Neck: Supple. Normal carotids. No JVD Lungs: Clear bilaterally to auscultation without  wheezes, rales, or rhonchi. Breathing is unlabored. Heart: Regular rate,  With normal  S1 S2. No murmurs, rubs, or gallops  Abdomen: Soft, non-tender, non-distended with normoactive bowel sounds. No hepatomegaly. No rebound/guarding. No abdominal masses. Msk:  Strength and tone appear normal for age. Extremities: her right arm is still mildly swollen, tense Neuro: Alert and oriented X 3. Moves all extremities spontaneously. Psych:  Responds to questions appropriately with a normal affect.    Lab Results:   Basename 03/09/12 0410 03/08/12 0106 03/06/12 1346  NA 135 139 --  K 3.8 3.6 --  CL 95* 92* --  CO2 32 39* --  GLUCOSE 92 114* --  BUN 11 13 --  CREATININE 0.53 0.60 --  CALCIUM 9.1 8.4 --  MG -- -- 1.7  PHOS -- -- --    Basename 03/06/12 1346  AST 41*  ALT 29  ALKPHOS 67  BILITOT 1.1  PROT 5.7*  ALBUMIN 2.8*   No results found for this basename: LIPASE:2,AMYLASE:2 in the last 72 hours  Basename 03/09/12 0410 03/08/12 0106  WBC 9.6 10.9*  NEUTROABS -- --  HGB 16.5* 15.1*  HCT 54.3* 48.5*  MCV 98.9 99.0  PLT 172 179    Basename 03/06/12 1346  TSH 4.231  T4TOTAL --  T3FREE --  THYROIDAB --   No results found for this basename: VITAMINB12,FOLATE,FERRITIN,TIBC,IRON,RETICCTPCT in the last 72 hours   Tele:  NSR  Assessment/Plan:    DVT of upper extremity (deep vein  thrombosis) (03/06/2012) Continue heparin  COPD (chronic obstructive pulmonary disease) (01/26/2012) Plan per int. Med.  Pulmonary hypertension:  Unclear etiology.  PCWP pressure is mildly elevated.  Echo shows normal LV function. No new recs.    Disposition:    Alvia Grove., MD, [facility] 03/09/2012, 11:07 AM LOS: Day 3

## 2012-03-09 NOTE — Progress Notes (Signed)
Name: Mary [name] MRN: [id number] DOB: Apr 17, 1976 LOS: 3  PCCM PROGRESS NOTE  Subjective:  The patient is doing well this morning, stated she slept very well on the BIPAP. She normally wakes up every 10-15 minutes and did not wake up at all last night. No pain currently, no nausea/vomiting/diarrhea. Currently no respiratory distress.  Allergies No Known Allergies  Vital Signs: Filed Vitals:   03/09/12 0600  BP: 98/65  Pulse: 89  Temp:   Resp: 12   I/O last 3 completed shifts: In: 1235 [P.O.:640; I.V.:595] Out: 3501 [Urine:3501]  Physical Examination: General:  Awake, alert, conversant, no acute distress Neuro:  Intact, CN II-XII no deficits, moving all four extremities, no weakness noted HEENT:  Mucous membranes moist, EOM intact, PERRLA Neck:  Supple, no JVD, no bruits  Cardiovascular:  Faint sounds heard, S1 S2 heard Lungs:  Better air flow bilaterally, no wheezes heard Abdomen:  Distended, non-tender, soft, + BS Musculoskeletal:  Right groin with catheterization site, bilateral LE edema to mid calf, no calf tenderness Skin: intact, warm, dry  Labs: Heparin level: therapeutic BMP: Na 135, K 3.8, Cl 95, Bicarb 32, BUN 11, Cr 0.53, Glu 92 CBC: WBC 9.6, Hg 16.5, Platelets 172 PT/INR: 16.6 and 1.32  Lines, Tubes, etc: None  Microbiology: None  Antibiotics:  None  Studies/Events: CT abd/pelvis: fatty deposition in liver, moderate ascites  CT chest: possible PE right middle lobe, respiratory motion, enlarged RA/RV/right pulmonary artery, mediastinal adenopathy, thrombosis right subclavian vein  Consults: Cardiology  Assessment and Plan:  Acute Respiratory Distress - Resolved, is on BIPAP at night and room air while awake. Will likely be stable for regular floor transfer today. Will switch to CPAP with auto-titration at night time and plan to F/U with pulm as out-pt. Will see tomorrow.   Pulmonary hypertension - PFTs ordered for this admission, did go for  those this AM. Could be related to OSA versus pulmonary veno-occlusive disease. Will need sleep study and CPAP at night time. Can discuss trial of vasodilators as out-patient pending repeat studies in several weeks. Weight loss and medical management may be effective treatments.    Right sided heart failure - s/p right heart catheterization 4/2. Could be due to lung processes.   Primary respiratory acidosis - chronic, from primary lung process causing CO2 retention.    Right subclavian DVT - no clear reason for this, treating with heparin and are transitioning to coumadin.   Smoking history - encouraged need to fully cessate and refrain permanently.   Alcohol history - Used to be a "heavy" drinker and now admits to only drinking "several" times per month drinking "several" drinks with friends or at social occasions. Did encourage need for complete cessation.   Disposition - Will transfer to the floor today. Will continue nebulizers as she had good clinical improvement with them. CPAP at night.   Best Practice: DVT: heparin drip and coumadin SUP: none Nutrition: heart healthy diet Glycemic control: none, glucose 110s on BMPs Sedation/analgesia: valium  [name], Mary 03/09/2012, 7:17 AM   Pt seen and examined and database reviewed. I agree with above findings, assessment and plan. She looks great today. Sleep quality is much improved with nCPAP. Transfer out of ICU to regular bed with Tele today  Billy Fischer, MD;  PCCM service; Mobile [phone]

## 2012-03-09 NOTE — Progress Notes (Signed)
ANTICOAGULATION CONSULT NOTE - Follow Up Consult  Pharmacy Consult for heparin Indication: subclavian vein thrombosis  No Known Allergies  Patient Measurements: Height: 5\' 2"  (157.5 cm) Weight: 197 lb 15.6 oz (89.8 kg) IBW/kg (Calculated) : 50.1  Heparin Dosing Weight: 70kg  Vital Signs: Temp: 97.9 F (36.6 C) (04/04 0804) Temp src: Oral (04/04 0804) BP: 102/68 mmHg (04/04 0800) Pulse Rate: 80  (04/04 0800)  Labs:  Basename 03/09/12 0410 03/08/12 0840 03/08/12 0106 03/07/12 0420  HGB 16.5* -- 15.1* --  HCT 54.3* -- 48.5* 49.1*  PLT 172 -- 179 185  APTT -- -- -- --  LABPROT 16.6* -- 14.2 --  INR 1.32 -- 1.08 --  HEPARINUNFRC 0.70 0.55 0.35 --  CREATININE 0.53 -- 0.60 0.66  CKTOTAL -- -- -- --  CKMB -- -- -- --  TROPONINI -- -- -- --   Estimated Creatinine Clearance: 103.2 ml/min (by C-G formula based on Cr of 0.53).   Medications:  Infusions:     . heparin 1,700 Units/hr (03/09/12 0800)    Assessment: 36 yo F on IV heparin for subclavian vein thrombosis bridging to coumadin.  Heparin restarted 4/2 s/p sheath removal. Heparin level is therapeutic at 0.70 but trending up. No bleeding noted. Hbg stable at 16.5 and plts at 172. INR is subtherapeutic as expected but increasing (1.32<<1.08).   Goal of Therapy:  Heparin level 0.3-0.7 units/ml  INR 2-3  Plan:  1. Decrease heparin slightly to 1650 units/hr (16.52ml/hr) 2. Check daily heparin level and CBC 3. Coumadin 7.5 mg po x 1 dose again tonight 4. F/u INR in AM  [facility], Pharm.D. Pager: [phone] 03/09/12 0840am

## 2012-03-10 ENCOUNTER — Other Ambulatory Visit: Payer: Self-pay | Admitting: Internal Medicine

## 2012-03-10 DIAGNOSIS — G4730 Sleep apnea, unspecified: Secondary | ICD-10-CM

## 2012-03-10 DIAGNOSIS — I2789 Other specified pulmonary heart diseases: Secondary | ICD-10-CM

## 2012-03-10 LAB — HEPARIN LEVEL (UNFRACTIONATED): Heparin Unfractionated: 0.24 IU/mL — ABNORMAL LOW (ref 0.30–0.70)

## 2012-03-10 LAB — BASIC METABOLIC PANEL
CO2: 30 mEq/L (ref 19–32)
Chloride: 98 mEq/L (ref 96–112)
Sodium: 135 mEq/L (ref 135–145)

## 2012-03-10 LAB — CBC
HCT: 49.9 % — ABNORMAL HIGH (ref 36.0–46.0)
Hemoglobin: 15.2 g/dL — ABNORMAL HIGH (ref 12.0–15.0)
MCV: 98.4 fL (ref 78.0–100.0)
RBC: 5.07 MIL/uL (ref 3.87–5.11)
WBC: 8.9 10*3/uL (ref 4.0–10.5)

## 2012-03-10 LAB — PROTIME-INR: INR: 1.36 (ref 0.00–1.49)

## 2012-03-10 MED ORDER — WARFARIN SODIUM 2.5 MG PO TABS
12.5000 mg | ORAL_TABLET | Freq: Once | ORAL | Status: AC
  Administered 2012-03-10: 12.5 mg via ORAL
  Filled 2012-03-10: qty 1

## 2012-03-10 MED ORDER — HEPARIN (PORCINE) IN NACL 100-0.45 UNIT/ML-% IJ SOLN
1700.0000 [IU]/h | INTRAMUSCULAR | Status: DC
  Administered 2012-03-10: 1700 [IU]/h via INTRAVENOUS
  Administered 2012-03-10: 1650 [IU]/h via INTRAVENOUS
  Administered 2012-03-11: 1700 [IU]/h via INTRAVENOUS
  Filled 2012-03-10 (×3): qty 250

## 2012-03-10 NOTE — Progress Notes (Signed)
   CARE MANAGEMENT NOTE 03/10/2012  Patient:  Mary [name], Mary [name]   Account Number:  [id number]  Date Initiated:  03/10/2012  Documentation initiated by:  GRAVES-BIGELOW,Lidia Clavijo  Subjective/Objective Assessment:   Pt admitted with Right arm, face and breast swelling. Per MD pt will need home 02 and cpap.     Action/Plan:   CM did call MD  and in order for pt to get a cpap before d/c a sleep study has to be made within 2 wks of d/c, then pt will be able to get cpap from [facility]. Please place settings in order. CM did refer to RN to make sure RN ambulates pt again.   Anticipated DC Date:  03/12/2012   Anticipated DC Plan:  HOME/SELF CARE      DC Planning Services  CM consult      PAC Choice  DURABLE MEDICAL EQUIPMENT   Choice offered to / List presented to:  C-1 Patient   DME arranged  [facility]GEN  CPAP      DME agency  [facility]        Status of service:  In process, will continue to follow Medicare Important Message given?   (If response is "NO", the following Medicare IM given date fields will be blank) Date Medicare IM given:   Date Additional Medicare IM given:    Discharge Disposition:    Per UR Regulation:    If discussed at Long Length of Stay Meetings, dates discussed:    Comments:  03-10-12 1632 Tomi Bamberger, RN,BSN [phone] CM did show RN how to document saturations. CM did speak to Derrian with [facility] to make sure pt will qualify for DME. Oxygen qualifications will last 48 hours. Plan is for home on Sunday or either Monday per MD Manson Passey. Pt has chosen [facility] for services for DME- CM will need to contact weekend liaision if pt is d/c  over the weekend for assistance. Weekend CM to assist with needs. Will continue to monitor.

## 2012-03-10 NOTE — Progress Notes (Signed)
Cardiology Progress Note Patient Name: Mary [name] Date of Encounter: 03/10/2012, 11:44 AM     Subjective  No overnight events. Patient doing well this morning. Reports she had the best sleep of her life with the BiPAP, but the nasal CPAP will take some getting use to. She prefers to have a full face mask.    Objective   Telemetry: Not on telemetry.  Medications: . albuterol-ipratropium  2 puff Inhalation TID  . Fluticasone-Salmeterol  1 puff Inhalation Q12H  . sodium chloride  3 mL Intravenous Q12H  . warfarin  12.5 mg Oral ONCE-1800  . warfarin  7.5 mg Oral ONCE-1800  . Warfarin - Pharmacist Dosing Inpatient   Does not apply q1800   . heparin 1,650 Units/hr (03/10/12 0920)    Physical Exam: Temp:  [97.5 F (36.4 C)-98.7 F (37.1 C)] 97.5 F (36.4 C) (04/05 0955) Pulse Rate:  [86-100] 95  (04/05 0955) Resp:  [19-23] 20  (04/05 0955) BP: (91-130)/(60-77) 113/77 mmHg (04/05 0955) SpO2:  [86 %-97 %] 94 % (04/05 0955) Weight:  [199 lb 8.3 oz (90.5 kg)] 199 lb 8.3 oz (90.5 kg) (04/05 0500)  General: Overweight white female in no acute distress.  Head: Normocephalic, atraumatic, sclera non-icteric, no xanthomas, nares are without discharge.  Neck: Swelling. Negative for carotid bruits. JVD difficult to assess.  Lungs: Clear throughout without rales,wheezes or rhonchi. Breathing is unlabored.  Heart: Distant heart sounds. RRR with S1 S2. No murmurs, rubs, or gallops appreciated.  Abdomen: Distended, soft, Non-tender, with normoactive bowel sounds. No rebound/guarding. No obvious abdominal masses.  Msk: Strength and tone appear normal for age.  Extremities: Swelling R arm/hand, 1-2+ bilat LE to below knees. No clubbing or cyanosis. Distal pedal pulses are inact and equal bilaterally.  Neuro: Alert and oriented X 3. Moves all extremities spontaneously.  Psych: Responds to questions appropriately with a normal affect.    Intake/Output Summary (Last 24 hours) at 03/10/12  1144 Last data filed at 03/10/12 0900  Gross per 24 hour  Intake    706 ml  Output      0 ml  Net    706 ml    Labs:  [facility] 03/10/12 0638 03/09/12 0410  NA 135 135  K 4.8 3.8  CL 98 95*  CO2 30 32  GLUCOSE 97 92  BUN 11 11  CREATININE 0.48* 0.53  CALCIUM 8.9 9.1   Basename 03/10/12 0638 03/09/12 0410  WBC 8.9 9.6  HGB 15.2* 16.5*  HCT 49.9* 54.3*  MCV 98.4 98.9  PLT 182 172     03/10/2012 06:38  Prothrombin Time 17.0 (H)  INR 1.36    03/06/2012 13:45   Pro B Natriuretic peptide (BNP)  2375.0 (H)     03/06/2012 13:46   TSH  4.231     Radiology/Studies:   03/07/12 - Right Heart Catheterization Hemodynamic Findings:  Ao: (cuff) 111/84  LV: not performed  RA: 23  RV: 59/12/25  PA: 64/45 (mean 53)  PCWP: 18  Fick Cardiac Output: 5.0 L/min  Fick Cardiac Index: 2.76 L/min/m2  Peripheral Aortic Saturation: 90%  Pulmonary Artery Saturation: 67%  Impression:  1. Elevated pulmonary artery pressures, right sided filling pressures  03/06/12 - 2D Echocardiogram  Study Conclusions: - Procedure narrative: Transthoracic echocardiography. Image quality was suboptimal. The study was technically difficult, as a result of poor acoustic windows and poor sound wave transmission. - Left ventricle: Systolic function was normal. The estimated ejection fraction was in the  range of 55% to 60%. - Ventricular septum: Septal motion showed paradox. - Right ventricle: The cavity size was moderately dilated. Systolic function was reduced. - Right atrium: The atrium was moderately dilated.   03/06/2012 - CXR  Findings: Peribronchial thickening may represent changes of chronic bronchitis. Basilar scarring. No segmental infiltrate or pneumothorax. Prominence right hilar region. This may represent overlapping vascular structures however, adenopathy or mass not excluded. Heart size top normal. IMPRESSION: Peribronchial thickening may represent changes of chronic bronchitis. Prominence right hilar  region. This may represent overlapping vascular structures however, adenopathy or mass not excluded.   03/06/2012 - Ct Angio Chest W/cm &/or Wo Cm  Findings: Filling defect right internal jugular vein/subclavian vein junction just proximal to the formation of the superior vena cava involving the right subclavian vein. Marked right chest wall edema involving the right breast and right axilla. Right axillary adenopathy. No pulmonary embolus (probable motion artifact involving the right lower lobe pulmonary vessels). No aortic dissection. Mediastinal and right hilar adenopathy. Scattered pulmonary parenchymal changes without dominant worrisome mass. Patchy parenchymal changes most notable right upper lobe. This can be reevaluated follow-up after acute episode has cleared. Small right-sided pleural effusion. Right heart enlargement. Small superiorly located pericardial effusion. No bony destructive lesion. Ascites. Slight lobularity liver without other findings of cirrhosis. Mild third spacing of fluid abdominal wall. IMPRESSION: Thrombosed right subclavian vein. Marked right chest wall edema involving the right breast and right axilla. Right axillary adenopathy. No pulmonary embolus (probable motion artifact involving the right lower lobe pulmonary vessels). Mediastinal and right hilar adenopathy. Scattered pulmonary parenchymal changes without dominant worrisome mass. Patchy parenchymal changes most notable right upper lobe. This can be reevaluated follow-up after acute episode has cleared. Small right-sided pleural effusion. Right heart enlargement. Small superiorly located pericardial effusion. Ascites. Slight lobularity liver without other findings of cirrhosis. Mild third spacing of fluid abdominal wall.   03/07/2012 - Ct Abdomen Pelvis W Contrast  Findings: There are small bilateral pleural effusions, right greater than left. The mild interlobular septal thickening is noted consistent with mild interstitial  edema. There is diffuse body wall edema identified. Focal area of low density adjacent to the falciform ligament is identified unlike lie represents focal fatty deposition. There is mild edema of the gallbladder wall. Non-specific in a hypoproteinemic and volume overload state. No biliary dilatation. The pancreas appears normal. The spleen is negative. Both adrenal glands are normal. The kidneys are both unremarkable. There are multiple upper abdominal lymph nodes but no evidence for adenopathy. There is no pelvic or inguinal adenopathy. Intermediate density fluid within the urinary bladder is present. This may reflect excreted contrast material from recent chest CT. There is no pelvic or inguinal adenopathy. The stomach and the small bowel loops appear normal. The appendix is identified and is normal. The colon is negative. Moderate ascites identified within the abdomen and pelvis. IMPRESSION: 1. Findings compatible with fluid overload state with ascites, body wall edema, and pleural effusions. 2. No mass or adenopathy identified.      Assessment and Plan  36 y.o. female w/ PMHx significant for tobacco abuse, alcoholism, and COPD who presented to [facility] on 03/06/2012 with complaints of right arm, face, and breast swelling and found to have right subclavian vein thrombosis and right sided heart failure.  1. 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 Also has a history of heavy alcohol abuse and  COPD. Presented with progressive abdominal distension and LE edema and found to have ascites, dilated RA/RV on echo and right subclavian thrombosis. LV function nl w/ EF 55-60%. pBNP elevated, LFTs & TSH normal. Right heart cath showed elevated pulmonary pressures with normal cardiac output. No new recs.  2. Pulmonary HTN: Unclear etiology, ?component of OSA vs OHA, pulmonary veno-occlusive dz. PCWP is  mildly elevated. Echo shows nl LV function. No new recs.  3. Right subclavian vein thrombosis: Unclear etiology. Cont heparin --> coumadin bridge. Management per primary team.  Signed, HOPE, JESSICA PA-C  Attending Note:   The patient was seen and examined.  Agree with assessment and plan as noted above.  Pt will need treatment of her sleep apnea and other issues. We will sign off. Call for questions.   Vesta Mixer, Montez Hageman., MD, [facility] 03/10/2012, 1:21 PM

## 2012-03-10 NOTE — Progress Notes (Signed)
Medical Student Daily Progress Note  Subjective: Patient is 36 yo pleasant female with history of alcohol abuse, tobacco abuse, and COPD. She reports she did not sleep as well last night with her CPAP, but this was due to her new mask not covering her mouth and only her nose. She reports her breathing is the same and not getting any worse. She says her arm swelling is also doing slightly better. She is still very engaged in her healthcare and is ready to go home as soon as she is possible to. Objective: Vital signs in last 24 hours: Filed Vitals:   03/10/12 0500 03/10/12 0600 03/10/12 0907 03/10/12 0955  BP:  129/75  113/77  Pulse:  87  95  Temp:  98.3 F (36.8 C)  97.5 F (36.4 C)  TempSrc:  Oral  Oral  Resp:  20  20  Height:      Weight: 90.5 kg (199 lb 8.3 oz)     SpO2:  96% 91% 94%   Weight change: 0.7 kg (1 lb 8.7 oz)  Intake/Output Summary (Last 24 hours) at 03/10/12 1353 Last data filed at 03/10/12 0900  Gross per 24 hour  Intake    433 ml  Output      0 ml  Net    433 ml   Physical Exam: BP 113/77  Pulse 95  Temp(Src) 97.5 F (36.4 C) (Oral)  Resp 20  Ht 5\' 2"  (1.575 m)  Wt 90.5 kg (199 lb 8.3 oz)  BMI 36.49 kg/m2  SpO2 94%  LMP 01/28/2012 General appearance: alert, cooperative, no distress and mildly obese Head: Normocephalic, without obvious abnormality, atraumatic Eyes: conjunctivae/corneas clear. PERRL, EOM's intact. Fundi benign. Neck: no adenopathy, no carotid bruit, supple, symmetrical, trachea midline and thyroid not enlarged, symmetric, no tenderness/mass/nodules Back: symmetric, no curvature. ROM normal. No CVA tenderness. Lungs: clear to auscultation bilaterally Breasts: normal appearance, no masses or tenderness, right breast still more enlarged than left Heart: regular rate and rhythm Abdomen: soft, non-tender; bowel sounds normal; no masses,  no organomegaly and ascites still present Extremities: edema B/L LE, right arm, right breast Lab  Results: Basic Metabolic Panel:  Lab 03/10/12 4098 03/09/12 0410 03/06/12 1346  NA 135 135 --  K 4.8 3.8 --  CL 98 95* --  CO2 30 32 --  GLUCOSE 97 92 --  BUN 11 11 --  CREATININE 0.48* 0.53 --  CALCIUM 8.9 9.1 --  MG -- -- 1.7  PHOS -- -- --   Liver Function Tests:  Lab 03/06/12 1346  AST 41*  ALT 29  ALKPHOS 67  BILITOT 1.1  PROT 5.7*  ALBUMIN 2.8*   No results found for this basename: LIPASE:2,AMYLASE:2 in the last 168 hours No results found for this basename: AMMONIA:2 in the last 168 hours CBC:  Lab 03/10/12 0638 03/09/12 0410 03/06/12 0630  WBC 8.9 9.6 --  NEUTROABS -- -- 9.4*  HGB 15.2* 16.5* --  HCT 49.9* 54.3* --  MCV 98.4 98.9 --  PLT 182 172 --   Cardiac Enzymes: No results found for this basename: CKTOTAL:3,CKMB:3,CKMBINDEX:3,TROPONINI:3 in the last 168 hours BNP:  Lab 03/06/12 1345  PROBNP 2375.0*   D-Dimer: No results found for this basename: DDIMER:2 in the last 168 hours CBG: No results found for this basename: GLUCAP:6 in the last 168 hours Hemoglobin A1C: No results found for this basename: HGBA1C in the last 168 hours Fasting Lipid Panel: No results found for this basename: CHOL,HDL,LDLCALC,TRIG,CHOLHDL,LDLDIRECT in the last 119  hours Thyroid Function Tests:  Lab 03/06/12 1346  TSH 4.231  T4TOTAL --  FREET4 --  T3FREE --  THYROIDAB --   Coagulation:  Lab 03/10/12 0638 03/09/12 0410 03/08/12 0106 03/06/12 0725  LABPROT 17.0* 16.6* 14.2 16.2*  INR 1.36 1.32 1.08 1.27   Anemia Panel: No results found for this basename: VITAMINB12,FOLATE,FERRITIN,TIBC,IRON,RETICCTPCT in the last 168 hours Urine Drug Screen: Drugs of Abuse  No results found for this basename: labopia, cocainscrnur, labbenz, amphetmu, thcu, labbarb    Alcohol Level: No results found for this basename: ETH:2 in the last 168 hours Urinalysis:  Lab 03/06/12 1714  COLORURINE YELLOW  LABSPEC 1.039*  PHURINE 5.5  GLUCOSEU NEGATIVE  HGBUR NEGATIVE  BILIRUBINUR  NEGATIVE  KETONESUR 15*  PROTEINUR NEGATIVE  UROBILINOGEN 1.0  NITRITE NEGATIVE  LEUKOCYTESUR NEGATIVE   Misc. Labs: none  Micro Results: Recent Results (from the past 240 hour(s))  MRSA PCR SCREENING     Status: Normal   Collection Time   03/06/12  4:23 PM      Component Value Range Status Comment   MRSA by PCR NEGATIVE  NEGATIVE  Final    Studies/Results: No results found. Medications:  I have reviewed the patient's current medications. Prior to Admission:  Prescriptions prior to admission  Medication Sig Dispense Refill  . albuterol (PROVENTIL HFA;VENTOLIN HFA) 108 (90 BASE) MCG/ACT inhaler Inhale 2 puffs into the lungs every 6 (six) hours as needed. For shortness of breath      . Fluticasone-Salmeterol (ADVAIR) 250-50 MCG/DOSE AEPB Inhale 1 puff into the lungs every 12 (twelve) hours.      Marland Kitchen ibuprofen (ADVIL,MOTRIN) 600 MG tablet Take 600 mg by mouth every 6 (six) hours as needed. For pain      . PRESCRIPTION MEDICATION Take 1 tablet by mouth once. Patient states that she took one of her husbands prescribed medication for pain      . torsemide (DEMADEX) 20 MG tablet Take 20 mg by mouth 2 (two) times daily.       Scheduled Meds:   . albuterol-ipratropium  2 puff Inhalation TID  . Fluticasone-Salmeterol  1 puff Inhalation Q12H  . sodium chloride  3 mL Intravenous Q12H  . warfarin  12.5 mg Oral ONCE-1800  . warfarin  7.5 mg Oral ONCE-1800  . Warfarin - Pharmacist Dosing Inpatient   Does not apply q1800  . DISCONTD: albuterol  2.5 mg Nebulization Q6H  . DISCONTD: ipratropium  0.5 mg Nebulization Q6H  . DISCONTD: sodium chloride  3 mL Intravenous Q12H   Continuous Infusions:   . heparin 1,650 Units/hr (03/10/12 0920)  . DISCONTD: heparin 1,650 Units/hr (03/09/12 2149)   PRN Meds:.acetaminophen, acetaminophen, albuterol, ondansetron (ZOFRAN) IV, ondansetron, DISCONTD: sodium chloride Assessment/Plan:  Pulmonary Hyptertension:  She remains on 2-3 L oxygen throughout the  day and CPAP at night to sleep. This controls her oxygen levels very adequately.  Right heart failure: RV pressures increased due to her pulmonary HTN, chronic oxygen treatment will alleviate some of the strain on the heart and prevent more remodeling of the ventricle.  DVT upper extremity: Heparin and coumadin. She will continue coumadin after discharge for a length of time to be determined.  Anasarca: continued edema in her extremities; 1 lb weight gain over 24 hours; still a component of her RHF and pulmonary HTN and should continue to resolve with time.  Cirrhosis: stable for now and likely a component of her right heart failure and h/o alcohol abuse.  Disposition: We are waiting for  her Coumadin to become therapeutic and she will go home on oxygen and CPAP at night. She will need a sleep study after discharge for sleep apnea diagnosis. And she will follow up with pulmonology after discharge as well.   LOS: 4 days   This is a Psychologist, occupational Note.  The care of the patient was discussed with Dr. [name] and the assessment and plan formulated with their assistance.  Please see their attached note for official documentation of the daily encounter.  Lewie Chamber 03/10/2012, 1:53 PM

## 2012-03-10 NOTE — Progress Notes (Signed)
PGY1 Addendum I agree with excellent MS3 note above.  I have seen and examined the patient with MS3.  S:  No acute events overnight.  Patient did not sleep as well with nasal pillow CPAP, would like full face mask.  Patient continues to desaturate on room air while ambulating and at rest.  Transferred from ICU to floor yesterday.  O:  General: alert, cooperative, and in no apparent distress HEENT: pupils equal round and reactive to light, vision grossly intact, oropharynx clear and non-erythematous, right face minimally edematous Neck: supple, no lymphadenopathy, unable to assess JVD due to redundant neck tissue Lungs: clear to ascultation bilaterally, normal work of respiration, no wheezes, rales, ronchi Heart: regular rate and rhythm, no murmurs, gallops, or rubs Abdomen: round, minimally tender to palpation diffusely, distended, dull to percussion  Extremities: 2+ bilateral pitting edema to the level of the thigh Neurologic: alert & oriented X3, cranial nerves II-XII intact, strength grossly intact, sensation intact to light touch  I have reviewed labs and imaging.  A/P: The patient is a 36 yo woman, presenting with anasarca   # Right heart failure - with pulm HTN, seen on echo and cardiac cath. Patient notes congenital heart disease as a child, which may play a role in current pathology. Likely causing/contributing to anasarca/LE edema/ascites.  -appreciate cardiology recs   # Emphysema/Pulm HTN/OSA - seen on cardiac cath, likely component of OSA vs OHA. Possible component of pulmonary veno-occlusive disease.  -continue nebulizers, advair  -appreciate CCM recs; may consider outpt vasodilators  -CPAP at night  -outpatient pulm follow-up   # R subclavian DVT/R IJ DVT - unclear etiology. CT chest/abd/pelvis shows lymphadenopathy (?reactive) but no evidence of primary malignancy.  -heparin drip  -continue coumadin, bridge with heparin until therapeutic  -consider outpatient hypercoag  work-up   # Cirrhosis - likely due to congestion from right heart failure, but also with history of alcohol abuse  # Prophy - heparin drip   # Dispo - d/c home with CPAP, with outpt sleep study scheduled for 4/24, when INR therapeutic on coumadin, likely in 2-3 days.  Signed [name], PGY1 03/10/2012 4:58 PM

## 2012-03-10 NOTE — Progress Notes (Signed)
Patient has been satting between 98-100% with O2-3L. She ambulated without O2 and her O2 level was between 82-88%. Patient never had any dizziness or SOB when ambulating. She is hooked back on the O2. Will continue to monitor.

## 2012-03-10 NOTE — Progress Notes (Signed)
ANTICOAGULATION CONSULT NOTE - Follow Up Consult  Pharmacy Consult for : Heparin, Coumadin Indication: R subclavian DVT/R IJ DVT of unclear etiology   No Known Allergies  Patient Measurements: Height: 5\' 2"  (157.5 cm) Weight: 199 lb 8.3 oz (90.5 kg) IBW/kg (Calculated) : 50.1    Vital Signs: Temp: 98.3 F (36.8 C) (04/05 0600) Temp src: Oral (04/05 0600) BP: 129/75 mmHg (04/05 0600) Pulse Rate: 87  (04/05 0600)  Labs:  Basename 03/10/12 1610 03/09/12 0410 03/08/12 0840 03/08/12 0106  HGB 15.2* 16.5* -- --  HCT 49.9* 54.3* -- 48.5*  PLT 182 172 -- 179  APTT -- -- -- --  LABPROT 17.0* 16.6* -- 14.2  INR 1.36 1.32 -- 1.08  HEPARINUNFRC 0.24* 0.70 0.55 --  CREATININE 0.48* 0.53 -- 0.60  CKTOTAL -- -- -- --  CKMB -- -- -- --  TROPONINI -- -- -- --   Estimated Creatinine Clearance: 103.7 ml/min (by C-G formula based on Cr of 0.48).   Medications:     albuterol-ipratropium 2 puff Inhalation TID  Fluticasone-Salmeterol 1 puff Inhalation Q12H  sodium chloride 3 mL Intravenous Q12H  warfarin 7.5 mg Oral ONCE-1800  Warfarin - Pharmacist Dosing Inpatient  Does not apply q1800  DISCONTD: albuterol 2.5 mg Nebulization Q6H  DISCONTD: ipratropium 0.5 mg Nebulization Q6H  DISCONTD: sodium chloride 3 mL Intravenous Q12H    Assessment:  Patient is a 36 y/o female on Coumadin with Heparin bridging for R subclavian DVT/R IJ DVT of unclear etiology  Heparin infusing at 1650 units/hour.  Daily Heparin level down and SUBtherapeutic at  0.24 following a rate reduction  Coumadin Day #4 with Heparin bridging.  INR with slow response  1.08 >> 1.32 >> 1.36.  No bleeding observed.  Goal of Therapy:   INR 2-3  Heparin level 0.3-0.7 units/ml   Plan:   Increase Heparin infusion to 1900 units/hr.  Next Heparin level in 8 hours.  Coumadin 12.5 mg today.  Ramces Shomaker, Elisha Headland, Pharm.D. 03/10/2012 8:46 AM

## 2012-03-10 NOTE — Progress Notes (Signed)
SATURATION QUALIFICATIONS:  Patient Saturations on Room Air at Rest = 88%  Patient Saturations on Room Air while Ambulating = 82%  Patient Saturations on 3Liters of oxygen while Ambulating = 97-99%

## 2012-03-11 LAB — BASIC METABOLIC PANEL
CO2: 32 mEq/L (ref 19–32)
Calcium: 9.1 mg/dL (ref 8.4–10.5)
Chloride: 101 mEq/L (ref 96–112)
Potassium: 4.9 mEq/L (ref 3.5–5.1)
Sodium: 139 mEq/L (ref 135–145)

## 2012-03-11 LAB — PROTIME-INR
INR: 1.96 — ABNORMAL HIGH (ref 0.00–1.49)
Prothrombin Time: 22.7 seconds — ABNORMAL HIGH (ref 11.6–15.2)

## 2012-03-11 LAB — HEPARIN LEVEL (UNFRACTIONATED)
Heparin Unfractionated: 0.41 IU/mL (ref 0.30–0.70)
Heparin Unfractionated: 0.43 IU/mL (ref 0.30–0.70)

## 2012-03-11 LAB — CBC
Hemoglobin: 14.4 g/dL (ref 12.0–15.0)
RBC: 4.91 MIL/uL (ref 3.87–5.11)

## 2012-03-11 MED ORDER — ENOXAPARIN SODIUM 100 MG/ML ~~LOC~~ SOLN
90.0000 mg | Freq: Once | SUBCUTANEOUS | Status: AC
  Administered 2012-03-11: 90 mg via SUBCUTANEOUS
  Filled 2012-03-11: qty 1

## 2012-03-11 MED ORDER — IPRATROPIUM-ALBUTEROL 18-103 MCG/ACT IN AERO
2.0000 | INHALATION_SPRAY | Freq: Two times a day (BID) | RESPIRATORY_TRACT | Status: DC
  Administered 2012-03-11: 2 via RESPIRATORY_TRACT

## 2012-03-11 MED ORDER — WARFARIN SODIUM 7.5 MG PO TABS: 7.5000 mg | ORAL_TABLET | Freq: Every day | ORAL | Status: DC

## 2012-03-11 NOTE — Progress Notes (Signed)
Pt IV's removed tips intact. DC papers discussed and signed. Pt left with husband via wheelchair.

## 2012-03-11 NOTE — Discharge Instructions (Signed)
You were hospitalized for generalized edema in your body. There was edema (fluid) in various places including the right side of you face, your right breast, right arm, and both of your thighs and legs. You also had some shortness of breath at the beginning of your hospital stay that was due to the arteries in your lungs constricting blood flow. During your hospitalization a clot was also found in your upper right neck (Right subclavian vein) that we treated with heparin and Coumadin. Your sleeping at night improved with a CPAP (continuous positive airway pressure) machine which allows your lungs to always have a sufficient amount of oxygen while you sleep. A condition called sleep apnea is what causes you to periodically stop breathing at night which prevents your lungs from being able to send a sufficient amount of oxygenated blood to your body, and this problem causes blood vessels around your lung to constrict in size and force the right side of your heart to work harder than it needs to. This also causes a back up of fluid into your body causing the fluid in your arm and legs as you have noticed. This will resolve with time.  Follow Up:  #1) For your clot you are taking Coumadin (warfarin) which is a medicine to help keep your blood thin. This medicine works over a long period of time so it is important to take your dose everyday. We monitor how well this medicine is working with a lab value called INR. We want it to be in a certain range that is healthy for you. In order to accomplish this you will follow up with Dr. Alexandria Lodge periodically for dosage adjustments and lab value checks.  #2) For your lungs you are breathing oxygen during the day and using your CPAP machine at night. It is very important to continue using this oxygen therapy on a continuous and faithful schedule. The oxygen is allowing your lungs and heart to function optimally and allowing your body to reabsorb and excrete and extra fluid that  you are carrying around in your arms and legs.  #3) For your heart you have a follow up appointment with Dr. Jens Som who will be monitoring how your recovery is doing and how well your heart is pumping blood back out to your lungs again.  Sleep Apnea Sleep apnea is a common disorder. The main problem of this disorder is excessive daytime sleepiness and compromised quality of life. This may include social and emotional problems. There are two types of sleep apnea.  Obstructive sleep apnea is when breathing stops due to a blocked airway.   Central sleep apnea is a malfunction of the brain's normal signal to breathe.  SYMPTOMS  Restless sleep.   Falling asleep while driving and/or during the day.   Loss of energy.   Irritability.   Mood or behavior changes.   Loud, heavy snoring.   Morning headaches.   Trouble concentrating.   Forgetfulness.   Anxiety or depression.   Decreased interest in sex.  Not all people with sleep apnea have all of these symptoms. However, people who have a few of these symptoms should visit their caregiver for an evaluation. Problems related to untreated sleep apnea include:  High blood pressure (hypertension).   Coronary artery disease.   Impotence.   Cognitive dysfunction.   Memory loss.  TREATMENT  For mild cases, treatment may include avoiding sleeping on one's back.   For people with nasal congestion, a decongestant may be prescribed.  Patients with obstructive and central apnea should avoid depressants. This includes alcohol, sedatives and narcotics. Weight loss and diet control are encouraged for overweight patients.   Many serious cases of obstructive sleep apnea can be relieved by a treatment called nasal continuous positive airway pressure (nasal CPAP). Nasal CPAP uses a mask-like device and pump that work together to keep the airway open. The pump delivers air pressure during each breath.   Surgery may help some patients by  stopping or reducing the narrowing of the airway due to anatomical defects.  PROGNOSIS  Removing the obstruction usually reverses hypertension and cardiac problems. Untreated, sleep apnea sufferers have a tendency to fall asleep during the day. This is can result in serious accident or loss of ones job. RESEARCH Sleep apnea is currently one of the most active areas of sleep research.  Document Released: 11/12/2002 Document Revised: 11/11/2011 Document Reviewed: 03/10/2006 [facility] Patient Information 2012 [place], [place].

## 2012-03-11 NOTE — Progress Notes (Signed)
ANTICOAGULATION CONSULT NOTE - Follow Up Consult  Pharmacy Consult for : Heparin Indication: R subclavian DVT/R IJ DVT of unclear etiology   No Known Allergies  Patient Measurements: Height: 5\' 2"  (157.5 cm) Weight: 199 lb 8.3 oz (90.5 kg) IBW/kg (Calculated) : 50.1    Vital Signs: Temp: 97.5 F (36.4 C) (04/05 2204) Temp src: Oral (04/05 2204) BP: 121/83 mmHg (04/05 2204) Pulse Rate: 92  (04/05 2204)  Labs:  Basename 03/11/12 0002 03/10/12 0638 03/09/12 0410 03/08/12 0106  HGB -- 15.2* 16.5* --  HCT -- 49.9* 54.3* 48.5*  PLT -- 182 172 179  APTT -- -- -- --  LABPROT -- 17.0* 16.6* 14.2  INR -- 1.36 1.32 1.08  HEPARINUNFRC 0.43 0.24* 0.70 --  CREATININE -- 0.48* 0.53 0.60  CKTOTAL -- -- -- --  CKMB -- -- -- --  TROPONINI -- -- -- --   Estimated Creatinine Clearance: 103.7 ml/min (by C-G formula based on Cr of 0.48).   Medications:     albuterol-ipratropium 2 puff Inhalation TID  Fluticasone-Salmeterol 1 puff Inhalation Q12H  sodium chloride 3 mL Intravenous Q12H  warfarin 12.5 mg Oral ONCE-1800  Warfarin - Pharmacist Dosing Inpatient  Does not apply q1800    Assessment: Patient is a 36 y/o female on Coumadin with Heparin bridging for R subclavian DVT/R IJ DVT of unclear etiology. Heparin level (0.43) is at-goal on 1700 units/hr.   Goal of Therapy:  INR 2-3 Heparin level 0.3-0.7 units/ml   Plan:  1. Continue IV heparin at 1700 units/hr.  2. Follow-up AM labs.  Thad Ranger, Mellody Drown, Pharm.D. 03/11/2012 12:36 AM

## 2012-03-11 NOTE — Discharge Summary (Signed)
Internal Medicine Teaching [facility] Discharge Note  Name: Mary [name] MRN: [id number] DOB: November 03, 1976 36 y.o.  Date of Admission: 03/06/2012  6:12 AM Date of Discharge: 03/11/2012 Attending Physician: Ulyess Mort  Discharge Diagnosis: 1. Right Subclavian DVT/PE - seen on CTA chest, etiology unclear 2. Pulmonary HTN - components of OSA vs OHS 3. Right Heart Failure - secondary to pulm HTN, also history of congenital heart disease s/p neonatal surgery 4. Obstructive Sleep Apnea 5. Anasarca - ascites and LE edema secondary to vascular congestion from R heart failure 6. Cirrhosis - vascular congestion from R heart failure, history of alcohol abuse  Discharge Medications: Medication List  As of 03/11/2012  9:37 PM   STOP taking these medications         PRESCRIPTION MEDICATION      torsemide 20 MG tablet         TAKE these medications         albuterol 108 (90 BASE) MCG/ACT inhaler   Commonly known as: PROVENTIL HFA;VENTOLIN HFA   Inhale 2 puffs into the lungs every 6 (six) hours as needed. For shortness of breath      Fluticasone-Salmeterol 250-50 MCG/DOSE Aepb   Commonly known as: ADVAIR   Inhale 1 puff into the lungs every 12 (twelve) hours.      ibuprofen 600 MG tablet   Commonly known as: ADVIL,MOTRIN   Take 600 mg by mouth every 6 (six) hours as needed. For pain      warfarin 7.5 MG tablet   Commonly known as: COUMADIN   Take 1 tablet (7.5 mg total) by mouth daily. TODAY ONLY (April 6th, 2013) take 1 and 1/2 tablets by mouth.            Disposition and follow-up:   Mary [name] was discharged from [facility] in stable and improved condition, with improvement in RUE and R facial edema, and improved oxygen saturations on supplemental oxygen.  The patient's discharge plan includes: 1. Home with home health O2, continuous, 3 liters 2. Home with home health CPAP, with scheduled split night sleep study 03/29/12 3. Follow-up with Dr.  Alexandria Lodge in the Coumadin Clinic 03/13/12, to monitor coumadin and INR 4. Follow-up with Dr. Jens Som for R heart failure 03/23/12 5. Follow-up with Dr. Marchelle Gearing for pulm HTN and obstructive lung disease 03/30/12 6. Follow-up with the [facility] Medicine [facility] within 2 weeks, to ensure understanding of and compliance with above plan, check BMET to assess bicarb (?continued CO2 retention), and establish care.  The patient is also to be referred for outpatient mammogram, given right-sided lymphadenopathy on CT.  Follow-up Appointments: Discharge Orders    Future Appointments: Provider: Department: Dept Phone: Center:   03/21/2012 1:30 PM Mc-Us 1 Mc-Ultrasound  [facility]   03/21/2012 3:00 PM Lb[facility]-Echo Echo 1 Mc-Site 3 Echo Lab  None   03/23/2012 2:00 PM Lewayne Bunting, MD [facility] [phone] [id number]   03/29/2012 8:00 PM Msd-Sleel Room 7 Msd-Sdc [place] [phone] [facility]   03/30/2012 2:45 PM Kalman Shan, MD [facility]-Pulmonary Care [phone] None     Future Orders Please Complete By Expires   Diet - low sodium heart healthy      Increase activity slowly      Discharge instructions      Comments:   See complete discharge instructions below.   Call MD for:  temperature >100.4      Call MD for:  difficulty breathing, headache or visual disturbances  Call MD for:  persistant dizziness or light-headedness         Consultations: Pulmonology/Critical Care [facility]), Cardiology ([address], [place], [place])  Procedures Performed:  Dg Chest 2 View  03/06/2012  *RADIOLOGY REPORT*  Clinical Data: Right-sided chest pain.  Swelling.  CHEST - 2 VIEW  Comparison: 01/24/2012.  Findings: Peribronchial thickening may represent changes of chronic bronchitis.  Basilar scarring.  No segmental infiltrate or pneumothorax.  Prominence right hilar region.  This may represent overlapping vascular structures however, adenopathy or mass not excluded.  Heart size top normal.  IMPRESSION: Peribronchial thickening  may represent changes of chronic bronchitis.  Prominence right hilar region.  This may represent overlapping vascular structures however, adenopathy or mass not excluded.  Original Report Authenticated By: Fuller Canada, M.D.   Ct Angio Chest W/cm &/or Wo Cm  03/07/2012  **ADDENDUM** CREATED: 03/07/2012 15:35:11  Per discussion with Dr. Bard Herbert, the patient had a right heart cath which  demonstrated elevated right heart pressures which corresponds to the enlarged right atrium and right ventricle and right main pulmonary artery and most likely is responsible for the ascites and third spacing of fluid.  With regard to the possibility pulmonary embolus, the present examination is limited by respiratory motion. Separate from the motion artifact is suggestion of a tiny filling defect within right middle lobe pulmonary branch vessel (series 6 images 105 through 108, series 602 image 31 and is series 603 image 43).  Chronic lung changes may also contribute to the elevated right heart pressures.  **END ADDENDUM** SIGNED BY: Almedia Balls. Constance Goltz, M.D.    03/06/2012  **ADDENDUM** CREATED: 03/06/2012 10:02:30  Contrast utilized was 100 ml of Omnipaque 350.  **END ADDENDUM** SIGNED BY: Almedia Balls. Constance Goltz, M.D.    03/06/2012  *RADIOLOGY REPORT*  Clinical Data: Right upper extremity swelling.  Question abnormal chest x-ray.  CT ANGIOGRAPHY CHEST  Technique:  Multidetector CT imaging of the chest using the standard protocol during bolus administration of intravenous contrast. Multiplanar reconstructed images including MIPs were obtained and reviewed to evaluate the vascular anatomy.  Contrast:  Comparison: 03/06/2012 chest x-ray.  Findings: Filling defect right internal jugular vein/subclavian vein junction just proximal to the formation of the superior vena cava involving the right subclavian vein.  Marked  right chest wall edema involving the right breast and right axilla.  Right axillary adenopathy.  No pulmonary embolus (probable  motion artifact involving the right lower lobe pulmonary vessels).  No aortic dissection.  Mediastinal and right hilar adenopathy.  Scattered pulmonary parenchymal changes without dominant worrisome mass.  Patchy parenchymal changes most notable right upper lobe. This can be reevaluated follow-up after acute episode has cleared.  Small right-sided pleural effusion.  Right heart enlargement. Small superiorly located pericardial effusion.  No bony destructive lesion.  Ascites.  Slight lobularity liver without other findings of cirrhosis.  Mild third spacing of fluid abdominal wall.  IMPRESSION: Thrombosed right subclavian vein.  Marked  right chest wall edema involving the right breast and right axilla.  Right axillary adenopathy.  No pulmonary embolus (probable motion artifact involving the right lower lobe pulmonary vessels).  Mediastinal and right hilar adenopathy.  Scattered pulmonary parenchymal changes without dominant worrisome mass.  Patchy parenchymal changes most notable right upper lobe. This can be reevaluated follow-up after acute episode has cleared.  Small right-sided pleural effusion.  Right heart enlargement. Small superiorly located pericardial effusion.  Ascites.  Slight lobularity liver without other findings of cirrhosis.  Mild third spacing of fluid abdominal wall.  Original Report Authenticated By: Fuller Canada, M.D.   US Abdomen Complete  02/28/2012  *RADIOLOGY REPORT*  Clinical Data:  Abdominal pain and bloating.  COMPLETE ABDOMINAL ULTRASOUND  Comparison:  None.  Findings:  Gallbladder:  Contracted.  No stones, wall thickening or sonographic Murphy's sign.  Common bile duct:  Measures 4 mm, within normal limits.  Liver:  Appears mildly increased in echogenicity.  No focal lesions.  IVC:  Visualization is limited by bowel gas.  Pancreas:  No focal abnormality seen.  Spleen:  Measures 11.9 cm, negative.  Right Kidney:  Measures 11.9 cm with normal parenchymal echogenicity.  No  hydronephrosis.  Left Kidney:  Measures 11.4 cm with normal parenchymal echogenicity.  No hydronephrosis.  Abdominal aorta:  Mid and distal abdominal aorta are obscured by bowel gas.  Otherwise, no aneurysm.  Additional finding:  Small amount of ascites is seen in the left lower quadrant and pelvis.  No postvoid residual in the bladder.  IMPRESSION:  1.  Small ascites in the left lower quadrant and anatomic pelvis. 2.  No evidence of urinary retention. 3.  Question hepatic steatosis.  Original Report Authenticated By: Reyes Ivan, M.D.   Ct Abdomen Pelvis W Contrast  03/07/2012  *RADIOLOGY REPORT*  Clinical Data: Right-sided edema and adenopathy  CT ABDOMEN AND PELVIS WITH CONTRAST  Technique:  Multidetector CT imaging of the abdomen and pelvis was performed following the standard protocol during bolus administration of intravenous contrast.  Contrast:  80 ml of omni 300  Comparison: None.  Findings:  There are small bilateral pleural effusions, right greater than left.  The mild interlobular septal thickening is noted consistent with mild interstitial edema.  There is diffuse body wall edema identified.  Focal area of low density adjacent to the falciform ligament is identified unlike lie represents focal fatty deposition.  There is mild edema of the gallbladder wall.  Non-specific in a hypoproteinemic and volume overload state.  No biliary dilatation.  The pancreas appears normal.  The spleen is negative.  Both adrenal glands are normal.  The kidneys are both unremarkable.  There are multiple upper abdominal lymph nodes but no evidence for adenopathy.  There is no pelvic or inguinal adenopathy. Intermediate density fluid within the urinary bladder is present.  This may reflect excreted contrast material from recent chest CT.  There is no pelvic or inguinal adenopathy.  The stomach and the small bowel loops appear normal.  The appendix is identified and is normal.  The colon is negative.  Moderate ascites  identified within the abdomen and pelvis.  IMPRESSION:  1.  Findings compatible with fluid overload state with ascites, body wall edema, and pleural effusions. 2.  No mass or adenopathy identified.  Original Report Authenticated By: Rosealee Albee, M.D.    2D Echo:  - Procedure narrative: Transthoracic echocardiography. Image quality was suboptimal. The study was technically difficult, as a result of poor acoustic windows and poor sound wave transmission. - Left ventricle: Systolic function was normal. The estimated ejection fraction was in the range of 55% to 60%. - Ventricular septum: Septal motion showed paradox. - Right ventricle: The cavity size was moderately dilated. Systolic function was reduced. - Right atrium: The atrium was moderately dilated.  Cardiac Cath:  Hemodynamic Findings:  Ao: (cuff) 111/84  LV: not performed  RA: 23  RV: 59/12/25  PA: 64/45 (mean 53)  PCWP: 18  Fick Cardiac Output: 5.0 L/min  Fick Cardiac Index: 2.76 L/min/m2  Peripheral Aortic Saturation: 90%  Pulmonary Artery Saturation: 67%  Impression:  1. Elevated pulmonary artery pressures, right sided filling pressures.   Admission HPI:  Patient is a 36 y.o. female with a PMHx of tobacco abuse and asthma, who presents to [facility] for evaluation of right arm, face, and breast swelling. The patient reports she first noticed abdominal tightness in February of this year. Her PCP had attributed this to her diaphragm pushing down as a result of her COPD. The patient began to develop progressive lower extremity edema and abdominal distention. An abdominal ultrasound was done which illustrated ascites. She was started on Lasix, later transitioned to Torsemide with appropriate dose adjustment which did help decrease the amount of edema and distention. The patient was then referred to a cardiologist who recommended echocardiogram. Patient has not had echo done as it was schedule later this month. Patient also reports  congenital heart abnormality that was repaired but cannot recall what it was. The patient reports URI illness over the last month, and stomach virus just 2 days prior to admission. She also noticed increasing swelling in her face, arm and right breast, that suddenly worsened overnight. Patient reports feeling short of breath, palpitations, and along with swelling decided she needed further evaluation. Patient was found to have subclavian vein thrombosis evaluated by doppler. She denies chest pain, but does mention having pain where the clot is that is worsened with coughing.   Admission Physical Exam T: 98.1, BP: 90/66, HR: 92 General:  Vital signs reviewed and noted. Well-developed, well-nourished, in no acute distress; alert, appropriate and cooperative throughout examination.   Eyes and Face:  PERRL, EOMI, right eyelid inflamed, right sided facial swelling, slight erythema of right side of face   Neck:  No deformities, masses, or tenderness noted.Supple, No carotid Bruits, no JVD.   Lungs:  Normal respiratory effort, bibasilar crackles heard, no wheezing   Heart:  RRR. S1 and S2 normal without gallop, murmur, or rubs, tachycardic   Abdomen:  BS normoactive. Soft, distended, non-tender. Unable to palpate spleen, gallbladder due to distention, striae present   Extremities:  Bilateral lower extremity edema 2+ extending to knees   Neurologic:  A&O X3, CN II - XII are grossly intact. Motor strength is 5/5 in the all 4 extremities   Skin:  Spider veins presents, no rashes or lesions, right breast erythematous when compared to left     Admission Labs Basic Metabolic Panel:  Recent Labs   [facility]  03/06/12 0630    NA  131*    K  3.9    CL  81*    CO2  38*    GLUCOSE  91    BUN  41*    CREATININE  1.08    CALCIUM  9.1    MG  --    PHOS  --    CBC:  Recent Labs   Basename  03/06/12 0630    WBC  13.1*    NEUTROABS  9.4*    HGB  16.3*    HCT  50.4*    MCV  95.8    PLT  192   ANC 9.4    Absolute monocytes 2.0  PT 16.2  INR 1.27  PTT 29   Hospital Course by problem list: 1. Right Subclavian DVT/PE - The patient presents with R-sided face, arm, and breast swelling, and was found to have a R subclavian DVT and small R middle lobe PE in pulmonary branch vessel on CTA.  The patient has no prior history  of clot, no known FH of clots, and no use of OCPs.  The patient was later found to have significant right heart failure, with vascular stasis potentially predisposing to clotting.  The patient was started on a heparin drip, and subsequently bridged to coumadin.  The patient's R-sided edema mildly improved by discharge.  The patient was also found to have right axillary, mediastinal, and hilar lymphadenopathy on CT, likely representing reactive lymphadenopathy to clot.  We were initially concerned that the lymphadenopathy was secondary to perhaps cancer, and preceded (and possibly contributed to) the patient's clot, but CT abd/pelvis was negative.  The patient will be referred for outpatient mammography at follow-up. The patient is to be continued on at least 6 months of anticoagulation, with consideration for life-long anticoagulation vs hypercoagulability work-up.  2. Pulmonary HTN - Cardiac Cath revealed significant pulmonary hypertension, likely secondary to components of OSA vs obesity hypoventilation syndrome, perhaps with components of pulmonary veno-occlusive disease.  The patient was found to have oxygen desaturations to 82% on room air with ambulation, and was started on continuous supplemental oxygen.  The patient was briefly kept in the ICU after an episode of observed apnea by the nursing staff and oxygen desaturations, and was started on BiPap due to hypoxemia and hypercarbia, later transitioned to CPAP.  The patient was discharged on continuous oxygen and home CPAP, to follow-up with Pulmonology.  3. Right Heart Failure - Seen on echo, secondary to pulmonary hypertension (see  above).  The patient's right heart failure likely accounts for her ascites and lower extremity edema, due to vascular congestion.  The patient was followed by Cardiology, and will follow-up as an outpatient.  4. Obstructive Sleep Apnea - The patient was observed by nursing to have apneic episodes while sleeping, with oxygen desaturations to the 60's despite supplemental O2 by nasal cannula, which resolved with CPAP/BiPap.  The patient's ABG while awake demonstrated hypercarbia (pCO2 = 64), suggesting a component of obesity hypoventilation syndrome, though this may have also been at least partially attributable to PE.  The patient was discharged home with CPAP, to complete an official split night sleep study on 03/29/12.  5. Anasarca/Cirrhosis - the patient presented with bilateral lower extremity edema, ascites, and R arm, breast, and face swelling, likely due to right heart failure and acute subclavian DVT noted above.  The patient was also noted to have evidence of cirrhosis on CT, due to vascular congestion from right heart failure +/- patient's history of alcohol abuse.  Face and upper extremity edema improved mildly by discharge.  Time spent on discharge: 45 minutes  Discharge Vitals:  BP 116/76  Pulse 92  Temp(Src) 98.1 F (36.7 C) (Oral)  Resp 22  Ht 5\' 2"  (1.575 m)  Wt 199 lb 8.3 oz (90.5 kg)  BMI 36.49 kg/m2  SpO2 94%  LMP 01/28/2012  Discharge Labs:  Results for orders placed during the hospital encounter of 03/06/12 (from the past 24 hour(s))  HEPARIN LEVEL (UNFRACTIONATED)     Status: Normal   Collection Time   03/11/12 12:02 AM      Component Value Range   Heparin Unfractionated 0.43  0.30 - 0.70 (IU/mL)  PROTIME-INR     Status: Abnormal   Collection Time   03/11/12  8:34 AM      Component Value Range   Prothrombin Time 22.7 (*) 11.6 - 15.2 (seconds)   INR 1.96 (*) 0.00 - 1.49   CBC     Status: Abnormal   Collection Time  03/11/12  8:34 AM      Component Value Range   WBC  9.2  4.0 - 10.5 (K/uL)   RBC 4.91  3.87 - 5.11 (MIL/uL)   Hemoglobin 14.4  12.0 - 15.0 (g/dL)   HCT 16.1 (*) 09.6 - 46.0 (%)   MCV 100.4 (*) 78.0 - 100.0 (fL)   MCH 29.3  26.0 - 34.0 (pg)   MCHC 29.2 (*) 30.0 - 36.0 (g/dL)   RDW 04.5  40.9 - 81.1 (%)   Platelets 198  150 - 400 (K/uL)  HEPARIN LEVEL (UNFRACTIONATED)     Status: Normal   Collection Time   03/11/12  8:34 AM      Component Value Range   Heparin Unfractionated 0.41  0.30 - 0.70 (IU/mL)  BASIC METABOLIC PANEL     Status: Abnormal   Collection Time   03/11/12  8:34 AM      Component Value Range   Sodium 139  135 - 145 (mEq/L)   Potassium 4.9  3.5 - 5.1 (mEq/L)   Chloride 101  96 - 112 (mEq/L)   CO2 32  19 - 32 (mEq/L)   Glucose, Bld 132 (*) 70 - 99 (mg/dL)   BUN 10  6 - 23 (mg/dL)   Creatinine, Ser 9.14 (*) 0.50 - 1.10 (mg/dL)   Calcium 9.1  8.4 - 78.2 (mg/dL)   GFR calc non Af Amer >90  >90 (mL/min)   GFR calc Af Amer >90  >90 (mL/min)    Signed: [name] 03/11/2012, 9:37 PM

## 2012-03-11 NOTE — Progress Notes (Signed)
ANTICOAGULATION CONSULT NOTE - Follow Up Consult  Pharmacy Consult for : Heparin, Coumadin Indication: R subclavian DVT/R IJ DVT of unclear etiology   No Known Allergies  Patient Measurements: Height: 5\' 2"  (157.5 cm) Weight: 199 lb 8.3 oz (90.5 kg) IBW/kg (Calculated) : 50.1    Vital Signs: Temp: 99.7 F (37.6 C) (04/06 0623) Temp src: Oral (04/06 0623) BP: 113/56 mmHg (04/06 0623) Pulse Rate: 115  (04/06 0623)  Labs:  Basename 03/11/12 0834 03/11/12 0002 03/10/12 1610 03/09/12 0410  HGB 14.4 -- 15.2* --  HCT 49.3* -- 49.9* 54.3*  PLT 198 -- 182 172  APTT -- -- -- --  LABPROT 22.7* -- 17.0* 16.6*  INR 1.96* -- 1.36 1.32  HEPARINUNFRC 0.41 0.43 0.24* --  CREATININE 0.49* -- 0.48* 0.53  CKTOTAL -- -- -- --  CKMB -- -- -- --  TROPONINI -- -- -- --   Estimated Creatinine Clearance: 103.7 ml/min (by C-G formula based on Cr of 0.49).   Medications:     albuterol-ipratropium 2 puff Inhalation BID  enoxaparin (LOVENOX) injection 90 mg Subcutaneous Once  Fluticasone-Salmeterol 1 puff Inhalation Q12H  sodium chloride 3 mL Intravenous Q12H  warfarin 12.5 mg Oral ONCE-1800  Warfarin - Pharmacist Dosing Inpatient  Does not apply q1800  DISCONTD: albuterol-ipratropium 2 puff Inhalation TID    Assessment:  Patient is a 36 y/o female on Coumadin with Heparin bridging for R subclavian DVT/R IJ DVT of unclear etiology  Heparin infusing at 1700 units/hour. Heparin level at goal  Coumadin Day #5 with Heparin bridging. INR now at goal 1.96. No bleeding observed.  Goal of Therapy:   INR 2-3  Heparin level 0.3-0.7 units/ml   Plan:  Continue Heparin infusion at 1700 units/hr.  Warfarin 12.5 mg x 1 today (or 1 and 1/2 tablets of 7.5mg  if discharged home today, plan then to take 7.5 mg daily until outpatient INR follow up, consider no later than mid next week Spoke with medical student regarding possible discharge today with one dose of Lovenox to complete full 5 day  overlap - would give 90 mg x 1, 1h after heparin discontinued.  Mathieu Schloemer Christine Virginia Crews, [place].D. 03/11/2012 12:03 PM

## 2012-03-11 NOTE — Progress Notes (Signed)
   CARE MANAGEMENT NOTE 03/11/2012  Patient:  Mary [name], Mary [name]   Account Number:  [id number]  Date Initiated:  03/10/2012  Documentation initiated by:  GRAVES-BIGELOW,BRENDA  Subjective/Objective Assessment:   Pt admitted with Right arm, face and breast swelling. Per MD pt will need home 02 and cpap.     Action/Plan:   CM did call MD  and in order for pt to get a cpap before d/c a sleep study has to be made within 2 wks of d/c, then pt will be able to get cpap from [facility]. Please place settings in order. CM did refer to RN to make sure RN ambulates pt again.   Anticipated DC Date:  03/12/2012   Anticipated DC Plan:  HOME/SELF CARE      DC Planning Services  CM consult      PAC Choice  DURABLE MEDICAL EQUIPMENT   Choice offered to / List presented to:  C-1 Patient   DME arranged  [facility]  CPAP      DME agency  [facility]        Status of service:  Completed, signed off Medicare Important Message given?   (If response is "NO", the following Medicare IM given date fields will be blank) Date Medicare IM given:   Date Additional Medicare IM given:    Discharge Disposition:  HOME W HOME HEALTH SERVICES  Per UR Regulation:    If discussed at Long Length of Stay Meetings, dates discussed:    Comments:  03/11/2012 1400 Contacted [facility] for oxygen and CPAP for d/c home today. Explained to pt to contact [facility] as soon as she arrives home to deliver oxygen. Explained portable will only last 3-4 hours. Isidoro Donning RN CCM Case Mgmt phone [phone]  03-10-12 [address] Tomi Bamberger, RN,BSN [phone] CM did show RN how to document saturations. CM did speak to Derrian with [facility] to make sure pt will qualify for DME. Oxygen qualifications will last 48 hours. Plan is for home on Sunday or either Monday per MD Manson Passey. Pt has chosen [facility] for services for DME- CM will need to contact weekend liaision if pt is d/c  over the weekend for assistance. Weekend CM to assist with needs. Will  continue to monitor.

## 2012-03-11 NOTE — Progress Notes (Signed)
PGY1 Addendum I agree with excellent MS3 note above.  I have seen and examined the patient with MS3. S:  No acute complaints overnight.  Patient tolerated CPAP well with full face mask.  Patient greatly desires to go home.  INR this morning is 1.96.  After discussion with pharmacy and with patient, since today is day 5 of warfarin, we will give lovenox injection today and can send home with PO coumadin.  O:  General: alert, cooperative, and in no apparent distress HEENT: pupils equal round and reactive to light, vision grossly intact, oropharynx clear and non-erythematous, right face minimally edematous Neck: supple, no lymphadenopathy, unable to assess JVD due to redundant neck tissue Lungs: clear to ascultation bilaterally, normal work of respiration, no wheezes, rales, ronchi Heart: regular rate and rhythm, no murmurs, gallops, or rubs Abdomen: round, minimally tender to palpation diffusely, distended, dull to percussion  Extremities: 2+ bilateral pitting edema to the level of the thigh Neurologic: alert & oriented X3, cranial nerves II-XII intact, strength grossly intact, sensation intact to light touch  I have reviewed labs and imaging.  A/P: The patient is a 36 yo woman, presenting with anasarca   # Right heart failure - with pulm HTN, seen on echo and cardiac cath. Patient notes congenital heart disease as a child, which may play a role in current pathology. Likely causing/contributing to anasarca/LE edema/ascites.  -appreciate cardiology recs   # Emphysema/Pulm HTN/OSA - Pulm HTN seen on cath, with likely component of OSA.   Possible component of pulmonary veno-occlusive disease.  -continue nebulizers, advair  -appreciate CCM recs, will follow-up with pulm outpatient -CPAP at night  -outpatient pulm follow-up   # R subclavian DVT - unclear etiology. CT chest/abd/pelvis shows lymphadenopathy (?reactive) but no evidence of primary malignancy.  -d/c heparin drip today (day 6 of  heparin, day 5 of heparin + coumadin), give 1 dose lovenox to complete bridge today -d/c home on coumadin, 7.5 mg tablets, 1.5 tablets tonight, then 1 tablet daily -f/u in coumadin clinic -consider outpatient hypercoag work-up   # Cirrhosis - likely due to congestion from right heart failure, but also with history of alcohol abuse   # Prophy - heparin drip   # Dispo - d/c home today, with HH continuous home O2 and HH CPAP   Signed [name], PGY1 03/11/2012 11:56 AM

## 2012-03-11 NOTE — Progress Notes (Signed)
Medical Student Daily Progress Note  Subjective: Patient is a very pleasant 36 yo female who came to the hospital with generalized edema throughout her body and some SOB. Subsequently she was found to have a subclavian vein thrombosis and pulmonary hypertension. She has been on CPAP to sleep at night and oxygen throughout the day and reports that she both sleeps very well and feels much better. She does report that the full CPAP face mask allows her to sleep much better than she ever has. She did have an episode of a nosebleed last night while wearing the mask but wasn't aware this had even happened until someone else pointed it out to her. Her nose had been bleeding during the week 2/2 dryness and last night was the first night her oxygen was humidified. Bleeding did not bother or alarm her, she simply mentioned it. She has expressed largely that she is ready to go home today and hoping that her INR is therapeutic in order to do so. Objective: Vital signs in last 24 hours: Filed Vitals:   03/10/12 2111 03/10/12 2204 03/11/12 0623 03/11/12 0805  BP:  121/83 113/56   Pulse:  92 115   Temp:  97.5 F (36.4 C) 99.7 F (37.6 C)   TempSrc:  Oral Oral   Resp:  18 24   Height:      Weight:      SpO2: 92% 92% 91% 94%   Weight change:   Intake/Output Summary (Last 24 hours) at 03/11/12 1023 Last data filed at 03/10/12 1700  Gross per 24 hour  Intake    760 ml  Output      0 ml  Net    760 ml   Physical Exam: BP 113/56  Pulse 115  Temp(Src) 99.7 F (37.6 C) (Oral)  Resp 24  Ht 5\' 2"  (1.575 m)  Wt 90.5 kg (199 lb 8.3 oz)  BMI 36.49 kg/m2  SpO2 94%  LMP 01/28/2012 General appearance: alert, cooperative, no distress and moderately obese Head: Normocephalic, without obvious abnormality, atraumatic Eyes: conjunctivae/corneas clear. PERRL, EOM's intact. Fundi benign. Neck: no adenopathy, no carotid bruit, no JVD, supple, symmetrical, trachea midline and thyroid not enlarged, symmetric, no  tenderness/mass/nodules Back: symmetric, no curvature. ROM normal. No CVA tenderness. Lungs: clear to auscultation bilaterally Breasts: normal appearance, no masses or tenderness, positive findings: still slight asymmetry, R breast larger Heart: regular rate and rhythm Abdomen: soft, non-tender; bowel sounds normal; no masses,  no organomegaly Extremities: edema Right arm, LE B/L, left leg has more edema today than previously Skin: Skin color, texture, turgor normal. No rashes or lesions Lab Results: Basic Metabolic Panel:  Lab 03/11/12 1478 03/10/12 0638 03/06/12 1346  NA 139 135 --  K 4.9 4.8 --  CL 101 98 --  CO2 32 30 --  GLUCOSE 132* 97 --  BUN 10 11 --  CREATININE 0.49* 0.48* --  CALCIUM 9.1 8.9 --  MG -- -- 1.7  PHOS -- -- --   Liver Function Tests:  Lab 03/06/12 1346  AST 41*  ALT 29  ALKPHOS 67  BILITOT 1.1  PROT 5.7*  ALBUMIN 2.8*   No results found for this basename: LIPASE:2,AMYLASE:2 in the last 168 hours No results found for this basename: AMMONIA:2 in the last 168 hours CBC:  Lab 03/11/12 0834 03/10/12 0638 03/06/12 0630  WBC 9.2 8.9 --  NEUTROABS -- -- 9.4*  HGB 14.4 15.2* --  HCT 49.3* 49.9* --  MCV 100.4* 98.4 --  PLT  198 182 --   Cardiac Enzymes: No results found for this basename: CKTOTAL:3,CKMB:3,CKMBINDEX:3,TROPONINI:3 in the last 168 hours BNP:  Lab 03/06/12 1345  PROBNP 2375.0*   D-Dimer: No results found for this basename: DDIMER:2 in the last 168 hours CBG: No results found for this basename: GLUCAP:6 in the last 168 hours Hemoglobin A1C: No results found for this basename: HGBA1C in the last 168 hours Fasting Lipid Panel: No results found for this basename: CHOL,HDL,LDLCALC,TRIG,CHOLHDL,LDLDIRECT in the last 161 hours Thyroid Function Tests:  Lab 03/06/12 1346  TSH 4.231  T4TOTAL --  FREET4 --  T3FREE --  THYROIDAB --   Coagulation:  Lab 03/11/12 0834 03/10/12 0638 03/09/12 0410 03/08/12 0106  LABPROT 22.7* 17.0* 16.6*  14.2  INR 1.96* 1.36 1.32 1.08   Anemia Panel: No results found for this basename: VITAMINB12,FOLATE,FERRITIN,TIBC,IRON,RETICCTPCT in the last 168 hours Urine Drug Screen: Drugs of Abuse  No results found for this basename: labopia, cocainscrnur, labbenz, amphetmu, thcu, labbarb    Alcohol Level: No results found for this basename: ETH:2 in the last 168 hours Urinalysis:  Lab 03/06/12 1714  COLORURINE YELLOW  LABSPEC 1.039*  PHURINE 5.5  GLUCOSEU NEGATIVE  HGBUR NEGATIVE  BILIRUBINUR NEGATIVE  KETONESUR 15*  PROTEINUR NEGATIVE  UROBILINOGEN 1.0  NITRITE NEGATIVE  LEUKOCYTESUR NEGATIVE   Misc. Labs: none  Micro Results: Recent Results (from the past 240 hour(s))  MRSA PCR SCREENING     Status: Normal   Collection Time   03/06/12  4:23 PM      Component Value Range Status Comment   MRSA by PCR NEGATIVE  NEGATIVE  Final    Studies/Results: No results found. Medications:  I have reviewed the patient's current medications. Prior to Admission:  Prescriptions prior to admission  Medication Sig Dispense Refill  . albuterol (PROVENTIL HFA;VENTOLIN HFA) 108 (90 BASE) MCG/ACT inhaler Inhale 2 puffs into the lungs every 6 (six) hours as needed. For shortness of breath      . Fluticasone-Salmeterol (ADVAIR) 250-50 MCG/DOSE AEPB Inhale 1 puff into the lungs every 12 (twelve) hours.      Marland Kitchen ibuprofen (ADVIL,MOTRIN) 600 MG tablet Take 600 mg by mouth every 6 (six) hours as needed. For pain      . PRESCRIPTION MEDICATION Take 1 tablet by mouth once. Patient states that she took one of her husbands prescribed medication for pain      . torsemide (DEMADEX) 20 MG tablet Take 20 mg by mouth 2 (two) times daily.       Scheduled Meds:   . albuterol-ipratropium  2 puff Inhalation BID  . Fluticasone-Salmeterol  1 puff Inhalation Q12H  . sodium chloride  3 mL Intravenous Q12H  . warfarin  12.5 mg Oral ONCE-1800  . Warfarin - Pharmacist Dosing Inpatient   Does not apply q1800  . DISCONTD:  albuterol-ipratropium  2 puff Inhalation TID   Continuous Infusions:   . heparin 1,700 Units/hr (03/11/12 0960)   PRN Meds:.acetaminophen, acetaminophen, albuterol, ondansetron (ZOFRAN) IV, ondansetron Assessment/Plan:  Pulmonary hypertension: revealed by right heart cath, mean PA pressure = 53. Probable component of sleep apnea for which CPAP is being administered at night. Could also be part of pulmonary veno occlusive disease. 3L O2 continuous throughout the day.  Right heart failure: right heart cath showed increased RV pressure. This is also the condition causing her edema/anasarca/ascites. Oxygen therapy should allow her heart to rest and reduce strain on right ventricle.  DVT upper extremity (Right subclavian vein thrombosis): Still unknown cause. Clot confirmed  by doppler and CT chest. Benign reactive lymphadenopathy most likely caused by clot as well. She has been treated with heparin and bridged with Coumadin.  DVT ppx: Coumadin to go home on and heparin inpatient.  Disposition: Patient is very ready to go home and expressed that passionately this morning. Her INR this am is 1.96 and she appears medically stable to go home with her current oxygen needs and will need f/u with pulmonology and have her sleep study done in a couple of weeks.   LOS: 5 days   This is a Psychologist, occupational Note.  The care of the patient was discussed with Dr. [name] and the assessment and plan formulated with their assistance.  Please see their attached note for official documentation of the daily encounter.  Lewie Chamber 03/11/2012, 10:23 AM

## 2012-03-13 ENCOUNTER — Ambulatory Visit (INDEPENDENT_AMBULATORY_CARE_PROVIDER_SITE_OTHER): Payer: 59 | Admitting: Pharmacist

## 2012-03-13 DIAGNOSIS — Z7901 Long term (current) use of anticoagulants: Secondary | ICD-10-CM | POA: Insufficient documentation

## 2012-03-13 DIAGNOSIS — I82629 Acute embolism and thrombosis of deep veins of unspecified upper extremity: Secondary | ICD-10-CM

## 2012-03-13 LAB — POCT INR: INR: 2.7

## 2012-03-13 NOTE — Patient Instructions (Signed)
Patient instructed to take medications as defined in the Anti-coagulation Track section of this encounter.  Patient instructed to take today's dose.  Patient verbalized understanding of these instructions.    

## 2012-03-13 NOTE — Progress Notes (Signed)
Anti-Coagulation Progress Note  Mary [name] is a 36 y.o. female who is currently on an anti-coagulation regimen.    RECENT RESULTS: Recent results are below, the most recent result is correlated with a dose of 7.5mg  per day since discharge from hospital. Lab Results  Component Value Date   INR 2.70 03/13/2012   INR 1.96* 03/11/2012   INR 1.36 03/10/2012    ANTI-COAG DOSE:   Latest dosing instructions   Total Sun Mon Tue Wed Thu Fri Sat   33.75 3.75 mg  3.75 mg 7.5 mg 7.5 mg 3.75 mg 7.5 mg    (7.5 mg0.5)  (7.5 mg0.5) (7.5 mg1) (7.5 mg1) (7.5 mg0.5) (7.5 mg1)         ANTICOAG SUMMARY: Anticoagulation Episode Summary              Current INR goal 2.0-3.0 Next INR check 03/20/2012   INR from last check 2.70 (03/13/2012)     Weekly max dose (mg)  Target end date 09/12/2012   Indications DVT of upper extremity (deep vein thrombosis), Encounter for long-term (current) use of anticoagulants   [facility]R check location [facility] lab    Send INR reminders to    Comments             ANTICOAG TODAY: Anticoagulation Summary as of 03/13/2012              INR goal 2.0-3.0     Selected INR 2.70 (03/13/2012) Next INR check 03/20/2012   Weekly max dose (mg)  Target end date 09/12/2012   Indications DVT of upper extremity (deep vein thrombosis), Encounter for long-term (current) use of anticoagulants    Anticoagulation Episode Summary              INR check location [facility]erred lab    Send INR reminders to    Comments             PATIENT INSTRUCTIONS: Patient Instructions  Patient instructed to take medications as defined in the Anti-coagulation Track section of this encounter.  Patient instructed to take today's dose.  Patient verbalized understanding of these instructions.        FOLLOW-UP Return in 7 days (on 03/20/2012) for Follow up INR.  Hulen Luster, III Pharm.D., CACP

## 2012-03-13 NOTE — Progress Notes (Signed)
It appears Mary [name] was diagnosed with a right subclavian vein deep venous thrombosis on 03/06/2012.  Chart and INR reviewed.  Agree with Dr. Saralyn Pilar assessment and plan.

## 2012-03-19 ENCOUNTER — Inpatient Hospital Stay (HOSPITAL_COMMUNITY)
Admission: EM | Admit: 2012-03-19 | Discharge: 2012-03-22 | DRG: 189 | Disposition: A | Discharge status: Home Health | Payer: 59 | Attending: Internal Medicine | Admitting: Internal Medicine

## 2012-03-19 ENCOUNTER — Emergency Department (HOSPITAL_COMMUNITY): Payer: 59

## 2012-03-19 DIAGNOSIS — F1010 Alcohol abuse, uncomplicated: Secondary | ICD-10-CM | POA: Diagnosis present

## 2012-03-19 DIAGNOSIS — K7460 Unspecified cirrhosis of liver: Secondary | ICD-10-CM | POA: Diagnosis present

## 2012-03-19 DIAGNOSIS — J449 Chronic obstructive pulmonary disease, unspecified: Secondary | ICD-10-CM

## 2012-03-19 DIAGNOSIS — I2781 Cor pulmonale (chronic): Secondary | ICD-10-CM

## 2012-03-19 DIAGNOSIS — I509 Heart failure, unspecified: Secondary | ICD-10-CM | POA: Diagnosis present

## 2012-03-19 DIAGNOSIS — Z9119 Patient's noncompliance with other medical treatment and regimen: Secondary | ICD-10-CM

## 2012-03-19 DIAGNOSIS — R4182 Altered mental status, unspecified: Secondary | ICD-10-CM | POA: Diagnosis present

## 2012-03-19 DIAGNOSIS — Z86718 Personal history of other venous thrombosis and embolism: Secondary | ICD-10-CM

## 2012-03-19 DIAGNOSIS — I82629 Acute embolism and thrombosis of deep veins of unspecified upper extremity: Secondary | ICD-10-CM

## 2012-03-19 DIAGNOSIS — Z79899 Other long term (current) drug therapy: Secondary | ICD-10-CM

## 2012-03-19 DIAGNOSIS — R0603 Acute respiratory distress: Secondary | ICD-10-CM

## 2012-03-19 DIAGNOSIS — E669 Obesity, unspecified: Secondary | ICD-10-CM | POA: Diagnosis present

## 2012-03-19 DIAGNOSIS — I50810 Right heart failure, unspecified: Secondary | ICD-10-CM

## 2012-03-19 DIAGNOSIS — I279 Pulmonary heart disease, unspecified: Secondary | ICD-10-CM | POA: Diagnosis present

## 2012-03-19 DIAGNOSIS — R600 Localized edema: Secondary | ICD-10-CM | POA: Diagnosis present

## 2012-03-19 DIAGNOSIS — E875 Hyperkalemia: Secondary | ICD-10-CM

## 2012-03-19 DIAGNOSIS — Z7901 Long term (current) use of anticoagulants: Secondary | ICD-10-CM

## 2012-03-19 DIAGNOSIS — E872 Acidosis, unspecified: Secondary | ICD-10-CM | POA: Diagnosis present

## 2012-03-19 DIAGNOSIS — G4733 Obstructive sleep apnea (adult) (pediatric): Secondary | ICD-10-CM | POA: Diagnosis present

## 2012-03-19 DIAGNOSIS — N179 Acute kidney failure, unspecified: Secondary | ICD-10-CM | POA: Diagnosis present

## 2012-03-19 DIAGNOSIS — J9612 Chronic respiratory failure with hypercapnia: Secondary | ICD-10-CM | POA: Diagnosis present

## 2012-03-19 DIAGNOSIS — E662 Morbid (severe) obesity with alveolar hypoventilation: Secondary | ICD-10-CM | POA: Diagnosis present

## 2012-03-19 DIAGNOSIS — J9622 Acute and chronic respiratory failure with hypercapnia: Secondary | ICD-10-CM

## 2012-03-19 DIAGNOSIS — J4489 Other specified chronic obstructive pulmonary disease: Secondary | ICD-10-CM | POA: Diagnosis present

## 2012-03-19 DIAGNOSIS — R062 Wheezing: Secondary | ICD-10-CM

## 2012-03-19 DIAGNOSIS — F17200 Nicotine dependence, unspecified, uncomplicated: Secondary | ICD-10-CM | POA: Diagnosis present

## 2012-03-19 DIAGNOSIS — Z86711 Personal history of pulmonary embolism: Secondary | ICD-10-CM

## 2012-03-19 DIAGNOSIS — R0602 Shortness of breath: Secondary | ICD-10-CM | POA: Diagnosis present

## 2012-03-19 DIAGNOSIS — I10 Essential (primary) hypertension: Secondary | ICD-10-CM | POA: Diagnosis present

## 2012-03-19 DIAGNOSIS — Z91199 Patient's noncompliance with other medical treatment and regimen due to unspecified reason: Secondary | ICD-10-CM

## 2012-03-19 DIAGNOSIS — D6832 Hemorrhagic disorder due to extrinsic circulating anticoagulants: Secondary | ICD-10-CM

## 2012-03-19 DIAGNOSIS — R0600 Dyspnea, unspecified: Secondary | ICD-10-CM

## 2012-03-19 DIAGNOSIS — Z720 Tobacco use: Secondary | ICD-10-CM | POA: Diagnosis present

## 2012-03-19 DIAGNOSIS — R188 Other ascites: Secondary | ICD-10-CM | POA: Diagnosis present

## 2012-03-19 DIAGNOSIS — J9620 Acute and chronic respiratory failure, unspecified whether with hypoxia or hypercapnia: Principal | ICD-10-CM | POA: Diagnosis present

## 2012-03-19 DIAGNOSIS — T45515A Adverse effect of anticoagulants, initial encounter: Secondary | ICD-10-CM

## 2012-03-19 DIAGNOSIS — R791 Abnormal coagulation profile: Secondary | ICD-10-CM | POA: Diagnosis present

## 2012-03-19 DIAGNOSIS — R609 Edema, unspecified: Secondary | ICD-10-CM

## 2012-03-19 HISTORY — DX: Other pulmonary embolism without acute cor pulmonale: I26.99

## 2012-03-19 HISTORY — DX: Acute embolism and thrombosis of deep veins of unspecified upper extremity: I82.629

## 2012-03-19 HISTORY — DX: Obstructive sleep apnea (adult) (pediatric): G47.33

## 2012-03-19 LAB — COMPREHENSIVE METABOLIC PANEL
ALT: 42 U/L — ABNORMAL HIGH (ref 0–35)
AST: 39 U/L — ABNORMAL HIGH (ref 0–37)
Alkaline Phosphatase: 87 U/L (ref 39–117)
CO2: 33 mEq/L — ABNORMAL HIGH (ref 19–32)
Calcium: 10 mg/dL (ref 8.4–10.5)
GFR calc Af Amer: 64 mL/min — ABNORMAL LOW (ref >90)
GFR calc non Af Amer: 55 mL/min — ABNORMAL LOW (ref >90)
Glucose, Bld: 115 mg/dL — ABNORMAL HIGH (ref 70–99)
Potassium: 5.5 mEq/L — ABNORMAL HIGH (ref 3.5–5.1)
Sodium: 133 mEq/L — ABNORMAL LOW (ref 135–145)
Total Protein: 7.3 g/dL (ref 6.0–8.3)

## 2012-03-19 LAB — DIFFERENTIAL
Basophils Absolute: 0 10*3/uL (ref 0.0–0.1)
Basophils Relative: 0 % (ref 0–1)
Eosinophils Absolute: 0.1 10*3/uL (ref 0.0–0.7)
Eosinophils Relative: 1 % (ref 0–5)
Monocytes Absolute: 1.8 10*3/uL — ABNORMAL HIGH (ref 0.1–1.0)
Monocytes Relative: 12 % (ref 3–12)
Neutro Abs: 10.7 10*3/uL — ABNORMAL HIGH (ref 1.7–7.7)

## 2012-03-19 LAB — CBC
HCT: 52.4 % — ABNORMAL HIGH (ref 36.0–46.0)
Hemoglobin: 16.5 g/dL — ABNORMAL HIGH (ref 12.0–15.0)
MCH: 30.5 pg (ref 26.0–34.0)
MCHC: 31.5 g/dL (ref 30.0–36.0)
RDW: 15.7 % — ABNORMAL HIGH (ref 11.5–15.5)

## 2012-03-19 MED ORDER — SODIUM CHLORIDE 0.9 % IV SOLN
INTRAVENOUS | Status: DC
  Administered 2012-03-19: 22:00:00 via INTRAVENOUS

## 2012-03-19 MED ORDER — IPRATROPIUM BROMIDE 0.02 % IN SOLN
0.5000 mg | Freq: Once | RESPIRATORY_TRACT | Status: AC
  Administered 2012-03-19: 0.5 mg via RESPIRATORY_TRACT
  Filled 2012-03-19: qty 2.5

## 2012-03-19 MED ORDER — ALBUTEROL SULFATE (5 MG/ML) 0.5% IN NEBU
5.0000 mg | INHALATION_SOLUTION | Freq: Once | RESPIRATORY_TRACT | Status: AC
  Administered 2012-03-19: 5 mg via RESPIRATORY_TRACT
  Filled 2012-03-19: qty 1

## 2012-03-19 NOTE — ED Notes (Signed)
EKG completed at 2116. No OLD ekg.

## 2012-03-19 NOTE — H&P (Signed)
Hospital Admission Note Date: 03/20/2012  Patient name: Mary [name] Medical record number: [id number] Date of birth: 29-Apr-1976 Age: 36 y.o. Gender: female PCP: Elvina Sidle, MD, MD  Medical Service:          Internal Medicine Teaching Service    Attending physician:  Dr. Blanch Media    1st Contact:  Dr. [name]       Pager: [phone]  2nd Contact:  Dr. Leodis Sias Pager: [phone] After 5 pm or weekends: 1st Contact:      Pager: [phone] 2nd Contact:      Pager: [phone]  Chief Complaint:  Chief Complaint  Patient presents with  . Respiratory Distress     History of Present Illness: Mary [name] is a 36 y.o.female with past medical history significant for  heavy alcohol abuse, heart failure, recent admission for right upper extremity DVT as well as PE, respiratory failure, and severe obstructive sleep apnea, who presents with altered mental status since 5 PM on 03/19/2012.  Note patient is entirely somnolent at this time. History was provided by chart view and from her husband and friends.  Mary [name] was admitted to the [facility] for right arm face and breast swelling which was found to be due to acute DVT. She was also found to have pulmonary hypertension and subsequent right heart failure,  thought to be secondary to OSA versus OHS versus pulmonary veno- occlusive disease.  Patient developed acute on chronic respiratory failure requiring full face BiPAP. Cardiac cath revealed:  RA: 23, RV: 59/12/25, PA: 64/45 (mean 53), PCWP: 18, Fick Cardiac Output: 5.0 L/min, Fick Cardiac Index: 2.76 L/min/m2, peripheral aortic saturation: 90% pulmonary artery saturation 67% .  Mary [name] was discharged off all diuretics and in good condition.  Today the patient's husband tells me that Mary [name] was feeling good for 2 days after her admission, though she started to feel ill shortly thereafter. She was very weak and tired, With DOE.  She could no longer perform her ADLs, such as  getting out of the bathtub, unassisted. The patient developed nausea and vomiting approximately 4 days ago. She was discharged on home oxygen 3 L by nasal cannula, though started to develop increasing shortness of breath despite oxygen therapy as of April 12. On April 14 she developed lower extremity edema as well as lower extremity weakness. She had a near fall the morning of admission, where her husband stated she looked very pale. She also was very sleepy appearing. He then tells me that approximately 5 PM this night the patient became increasingly confused, leading to a obtundation when she arrived at the ED. Her husband states is [place] was complaining of small amount of pain in her right hand as well as her bilateral legs, and right chest. The patient currently does not have a working CPAP machine which she requires for her severe obstructive sleep apnea which has led to right heart failure.  Of note the patient's friend tells me that Mary [name] has recently restarted drinking. Mary [name] started drinking and smoking cigarettes shortly after her discharge from the hospital. Her drinking progressed until she was drinking approximately one fifth of vodka daily. Her friend tells me that yesterday was a particularly heavy drinking day. The patient's husband and friends state that they have been trying to high alcohol from her however she has been sneaking it.   Review of Systems: Bold Items are Positive and gathered from family members as the patient is unable to respond. Constitutional: appetite change  and fatigue.  HEENT: Unable to assess  Respiratory: SOB, DOE,  Cardiovascular:  leg swelling.  Gastrointestinal: Nausea, vomiting, constipation, Genitourinary: Unable to assess Musculoskeletal: Right arm pain  Legs and feet hurt form swelling Skin: Unable to assess Neurological: Lightheadedness and headaches.  Hematological: Unable to assess  Psychiatric/Behavioral: unable to assess  Past Medical  History  Diagnosis Date  . COPD (chronic obstructive pulmonary disease)     no documented PFTs, but patient said it was confirmed  . Chronic asthma   . History of alcoholism     sober last 5 years  . Tobacco abuse   . Irregular menses   . DVT of upper extremity (deep vein thrombosis) April 2013    right subclavian  . Pulmonary embolus April 2013  . Pulmonary HTN April 2013    Cardiac cath on 03/06/12  . OSA (obstructive sleep apnea)   . Cirrhosis     Past Surgical History  Procedure Date  . Cardiac surgery   . Lung surgery     Meds: Medications Prior to Admission  Medication Dose Route Frequency Provider Last Rate Last Dose  . albuterol (PROVENTIL) (5 MG/ML) 0.5% nebulizer solution 2.5 mg  2.5 mg Nebulization Q2H PRN Almyra Deforest, MD      . albuterol (PROVENTIL) (5 MG/ML) 0.5% nebulizer solution 5 mg  5 mg Nebulization Once Suzi Roots, MD   5 mg at 03/19/12 2214  . folic acid (FOLVITE) tablet 1 mg  1 mg Oral Daily Almyra Deforest, MD      . ipratropium (ATROVENT) nebulizer solution 0.5 mg  0.5 mg Nebulization Once Suzi Roots, MD   0.5 mg at 03/19/12 2214  . LORazepam (ATIVAN) tablet 1 mg  1 mg Oral Q6H PRN Almyra Deforest, MD       Or  . LORazepam (ATIVAN) injection 1 mg  1 mg Intravenous Q6H PRN Almyra Deforest, MD      . morphine 2 MG/ML injection 0.5 mg  0.5 mg Intravenous Q4H PRN Almyra Deforest, MD      . mulitivitamin with minerals tablet 1 tablet  1 tablet Oral Daily Almyra Deforest, MD      . ondansetron (ZOFRAN) tablet 4 mg  4 mg Oral Q6H PRN Almyra Deforest, MD       Or  . ondansetron (ZOFRAN) injection 4 mg  4 mg Intravenous Q6H PRN Almyra Deforest, MD      . sodium chloride 0.9 % injection 3 mL  3 mL Intravenous Q12H Almyra Deforest, MD      . thiamine (VITAMIN B-1) tablet 100 mg  100 mg Oral Daily Almyra Deforest, MD       Or  . thiamine (B-1) injection 100 mg  100 mg Intravenous Daily Almyra Deforest, MD      . DISCONTD: 0.9 %  sodium chloride infusion    Intravenous Continuous Suzi Roots, MD 20 mL/hr at 03/19/12 2214    . DISCONTD: furosemide (LASIX) injection 80 mg  80 mg Intravenous Q8H Almyra Deforest, MD   80 mg at 03/20/12 0210   Medications Prior to Admission  Medication Sig Dispense Refill  . albuterol (PROVENTIL HFA;VENTOLIN HFA) 108 (90 BASE) MCG/ACT inhaler Inhale 2 puffs into the lungs every 6 (six) hours as needed. For shortness of breath      . Fluticasone-Salmeterol (ADVAIR) 250-50 MCG/DOSE AEPB Inhale 1 puff into the lungs every 12 (twelve) hours.      Marland Kitchen ibuprofen (ADVIL,MOTRIN) 600 MG tablet Take 600 mg by  mouth every 6 (six) hours as needed. For pain      . warfarin (COUMADIN) 7.5 MG tablet Take 1 tablet (7.5 mg total) by mouth daily. TODAY ONLY (April 6th, 2013) take 1 and 1/2 tablets by mouth.  45 tablet  0    Allergies: Review of patient's allergies indicates no known allergies.  Family History  Problem Relation Age of Onset  . Hypertension Father   . Heart disease Father     CHF  . Alcohol abuse Father     History   Social History  . Marital Status: Married    Spouse Name: N/A    Number of Children: N/A  . Years of Education: college   Occupational History  .      [employer]   Social History Main Topics  . Smoking status: Current Everyday Smoker -- 1.0 packs/day for 25 years    Types: Cigarettes  . Smokeless tobacco: Not on file   Comment: quit smoking 1 week ago  . Alcohol Use: Yes     One drink per week  . Drug Use: No  . Sexually Active: Yes -- Female partner(s)    Birth Control/ Protection: None   Other Topics Concern  . Not on file   Social History Narrative   [place] with multiple animals including chickens, dogs and catsCompleted GED and 2 years of college     Physical Exam: Blood pressure 131/79, pulse 99, temperature 97.7 F (36.5 C), temperature source Axillary, resp. rate 18, last menstrual period 01/28/2012, SpO2 96.00%. Gen: Morbidly obese  female obtunded with no response to verbal or tactile stimulation. Spontaneous unlabored respirations present.  Head: Normocephalic, atraumatic. Eyes: PERRL, EOMI via doll's eye maneuver. No signs of anemia or jaundice.  Throat: Oropharynx nonerythematous, no exudate appreciated.  poor  Neck: Supple with no deformities, masses, or tenderness noted.  No carotid Bruits.  There is a prominent right external jugular vein that is somewhat tense to the touch, likely representing clot from prior admission  Lungs: Decreased respiratory expansion. Fine inspiratory crackles as well as coarse expiratory wheezes throughout.  Heart: Sinus tachycardia. S1 and S2 normal without  murmur or gallop  Abdomen: BS normoactive. Soft, nondistended, non-tender. No masses or organomegaly. Extremities: 3+ pretibial edema to the level of the knee  Neurologic: No focal neurologic deficit, biceps, brachioradialis, tibial reflexes intact and symmetric. Toes downgoing on Babinski Skin: No visible rashes, scars. Psych: Unable to assess, friend endorses depression on the part of the patient  Lab results:  Results for orders placed during the hospital encounter of 03/19/12 (from the past 48 hour(s))  COMPREHENSIVE METABOLIC PANEL     Status: Abnormal   Collection Time   03/19/12  9:58 PM      Component Value Range Comment   Sodium 133 (*) 135 - 145 (mEq/L)    Potassium 5.5 (*) 3.5 - 5.1 (mEq/L)    Chloride 92 (*) 96 - 112 (mEq/L)    CO2 33 (*) 19 - 32 (mEq/L)    Glucose, Bld 115 (*) 70 - 99 (mg/dL)    BUN 46 (*) 6 - 23 (mg/dL)    Creatinine, Ser 1.61 (*) 0.50 - 1.10 (mg/dL)    Calcium 09.6  8.4 - 10.5 (mg/dL)    Total Protein 7.3  6.0 - 8.3 (g/dL)    Albumin 3.6  3.5 - 5.2 (g/dL)    AST 39 (*) 0 - 37 (U/L)    ALT 42 (*)  0 - 35 (U/L)    Alkaline Phosphatase 87  39 - 117 (U/L)    Total Bilirubin 0.3  0.3 - 1.2 (mg/dL)    GFR calc non Af Amer 55 (*) >90 (mL/min)    GFR calc Af Amer 64 (*) >90 (mL/min)   CBC      Status: Abnormal   Collection Time   03/19/12  9:58 PM      Component Value Range Comment   WBC 14.8 (*) 4.0 - 10.5 (K/uL)    RBC 5.41 (*) 3.87 - 5.11 (MIL/uL)    Hemoglobin 16.5 (*) 12.0 - 15.0 (g/dL)    HCT 16.1 (*) 09.6 - 46.0 (%)    MCV 96.9  78.0 - 100.0 (fL)    MCH 30.5  26.0 - 34.0 (pg)    MCHC 31.5  30.0 - 36.0 (g/dL)    RDW 04.5 (*) 40.9 - 15.5 (%)    Platelets 306  150 - 400 (K/uL)   DIFFERENTIAL     Status: Abnormal   Collection Time   03/19/12  9:58 PM      Component Value Range Comment   Neutrophils Relative 73  43 - 77 (%)    Neutro Abs 10.7 (*) 1.7 - 7.7 (K/uL)    Lymphocytes Relative 14  12 - 46 (%)    Lymphs Abs 2.1  0.7 - 4.0 (K/uL)    Monocytes Relative 12  3 - 12 (%)    Monocytes Absolute 1.8 (*) 0.1 - 1.0 (K/uL)    Eosinophils Relative 1  0 - 5 (%)    Eosinophils Absolute 0.1  0.0 - 0.7 (K/uL)    Basophils Relative 0  0 - 1 (%)    Basophils Absolute 0.0  0.0 - 0.1 (K/uL)   PROTIME-INR     Status: Abnormal   Collection Time   03/19/12  9:58 PM      Component Value Range Comment   Prothrombin Time 42.2 (*) 11.6 - 15.2 (seconds)    INR 4.34 (*) 0.00 - 1.49    TROPONIN I     Status: Normal   Collection Time   03/19/12  9:59 PM      Component Value Range Comment   Troponin I <0.30  <0.30 (ng/mL)   PRO B NATRIURETIC PEPTIDE     Status: Abnormal   Collection Time   03/19/12  9:59 PM      Component Value Range Comment   Pro B Natriuretic peptide (BNP) 5727.0 (*) 0 - 125 (pg/mL)   CARDIAC PANEL(CRET KIN+CKTOT+MB+TROPI)     Status: Normal   Collection Time   03/20/12 12:15 AM      Component Value Range Comment   Total CK 32  7 - 177 (U/L)    CK, MB 3.0  0.3 - 4.0 (ng/mL)    Troponin I <0.30  <0.30 (ng/mL)    Relative Index RELATIVE INDEX IS INVALID  0.0 - 2.5    ETHANOL     Status: Normal   Collection Time   03/20/12 12:16 AM      Component Value Range Comment   Alcohol, Ethyl (B) <11  0 - 11 (mg/dL)   POCT I-STAT 3, BLOOD GAS (G3+)     Status: Abnormal    Collection Time   03/20/12 12:19 AM      Component Value Range Comment   pH, Arterial 7.184 (*) 7.350 - 7.400     pCO2 arterial 97.0 (*) 35.0 - 45.0 (mmHg)  pO2, Arterial 344.0 (*) 80.0 - 100.0 (mmHg)    Bicarbonate 36.5 (*) 20.0 - 24.0 (mEq/L)    TCO2 39  0 - 100 (mmol/L)    O2 Saturation 100.0      Acid-Base Excess 3.0 (*) 0.0 - 2.0 (mmol/L)    Patient temperature 98.6 F      Collection site RADIAL, ALLEN'S TEST ACCEPTABLE      Drawn by RT      Sample type ARTERIAL      Comment NOTIFIED PHYSICIAN     POCT I-STAT 3, BLOOD GAS (G3+)     Status: Abnormal   Collection Time   03/20/12  1:55 AM      Component Value Range Comment   pH, Arterial 7.271 (*) 7.350 - 7.400     pCO2 arterial 75.1 (*) 35.0 - 45.0 (mmHg)    pO2, Arterial 95.0  80.0 - 100.0 (mmHg)    Bicarbonate 34.6 (*) 20.0 - 24.0 (mEq/L)    TCO2 37  0 - 100 (mmol/L)    O2 Saturation 96.0      Acid-Base Excess 4.0 (*) 0.0 - 2.0 (mmol/L)    Patient temperature 98.6 F      Collection site RADIAL, ALLEN'S TEST ACCEPTABLE      Drawn by RT      Sample type ARTERIAL      Comment NOTIFIED PHYSICIAN     URINALYSIS, ROUTINE W REFLEX MICROSCOPIC     Status: Abnormal   Collection Time   03/20/12  2:12 AM      Component Value Range Comment   Color, Urine YELLOW  YELLOW     APPearance CLOUDY (*) CLEAR     Specific Gravity, Urine 1.021  1.005 - 1.030     pH 5.0  5.0 - 8.0     Glucose, UA NEGATIVE  NEGATIVE (mg/dL)    Hgb urine dipstick NEGATIVE  NEGATIVE     Bilirubin Urine NEGATIVE  NEGATIVE     Ketones, ur NEGATIVE  NEGATIVE (mg/dL)    Protein, ur 30 (*) NEGATIVE (mg/dL)    Urobilinogen, UA 0.2  0.0 - 1.0 (mg/dL)    Nitrite POSITIVE (*) NEGATIVE     Leukocytes, UA NEGATIVE  NEGATIVE    URINE MICROSCOPIC-ADD ON     Status: Abnormal   Collection Time   03/20/12  2:12 AM      Component Value Range Comment   Squamous Epithelial / LPF FEW (*) RARE     WBC, UA 0-2  <3 (WBC/hpf)    RBC / HPF 0-2  <3 (RBC/hpf)    Bacteria, UA  MANY (*) RARE     Casts HYALINE CASTS (*) NEGATIVE     Crystals CA OXALATE CRYSTALS (*) NEGATIVE       Other results: ED ECG REPORT   Date: 03/20/2012  EKG Time: 2:58 AM  Rate: 111  Rhythm: sinus tachycardia,    Axis: Right axis deviation  Intervals: No conduction abnormality  ST&T Change: None  Narrative Interpretation: Sinus tachycardia with right axis deviation consistent with prior known right heart failure.  Imaging results:  Dg Chest Port 1 View  03/19/2012  *RADIOLOGY REPORT*  Clinical Data: Shortness of breath.  PORTABLE CHEST - 1 VIEW  Comparison: Chest radiograph and CTA of the chest performed 03/06/2012  Findings: The lungs are well-aerated.  On the prior study, note was made of mild patchy airspace opacity, which could have reflected mild pneumonia or possibly atelectasis; this is slightly more apparent at  the lung bases on the current study.  There is no evidence of pleural effusion or pneumothorax.  The cardiomediastinal silhouette is within normal limits.  No acute osseous abnormalities are seen.  IMPRESSION: On the prior CTA, note was made of mild patchy airspace opacity, which could have reflected mild pneumonia; this is slightly more apparent at the lung bases on the current study, raising concern for persistent mild pneumonia.  Would complete treatment for pneumonia, then consider performing follow-up CT in several weeks as previously suggested, to ensure resolution of vague right upper lobe airspace opacity.  Original Report Authenticated By: Tonia Ghent, M.D.         Assessment & Plan by Problem: Principal Problem: Acute on chronic respiratory failure - Acute respiratory failure multifactorial including CHF exacerbation with a pro-BNP of 5727 (elevated from 2375 03/06/12, 2 D Echo from 03/06/12 EF 55%, Right heart catheter 03/07/12 which demonstrated elevated right heart pressures which corresponds to the enlarged right atrium and right ventricle and right main pulmonary  artery ), non-compliance with CPAP,  AMS in the setting of alcohol intoxication and/or antifreeze intoxication.  Other DD include PE although patient has been on Coumadin and was found to be supratherapeutic with INR of 4. Furthermore MI can be considered although highly unlikely since patient is 36 year old and no significant risk factors. Patient was placed on CPAP .An ABG was obtained which showed pH of 7.1, PCO2 97 , PO2 344. Subsequently patient was placed on BIPAP and repeat ABG showed improved with PH of 7.2, PCO2  75 PO2 55. PCCM was consulted initially to assist with intubation due to  acute respiratory failure with AMS.   - Admit to SDU - Continue BIPAP - Appreciate PCCM assistance  - Cardiac enzymes  - given 1 dose of lasix   Alcohol abuse with questionable ethylene glycol  and/or Methanol intoxication. -- UA was consistent with calcium oxalate crystals and patient was found to have worsening  renal function which is concerning for ethylene glycol intoxication. While she does not have a anion gap acidosis, UTDOL states that this is not absolutely required with ethylene glycol ingestion. As her family has been hiding her alcohol, Mary Barbary may have turned to methanol or ethylene glycol instead.  If her lactat or OSM are WNL then likely oxalate crystals are a non-specific finding and ARF would be 2/2 pre renal failure. - -Will obtainethylene glycol  ,Methanol , ethanol level, plasma osmolality, lactic acid  -- Fomepizole 15mg /kg loading dose and then 10 mg/kg q12 hours continued until concentration is less than 20 g/dl in the setting of retinal or renal injury - -CIWA protocol with thiamin, folic acid and multivitamin   Acute renal failure - multifactorial including Acute right heart failure, volume depletion with BUN/Cr ratio and FeNa showing prerenal etiology. As above, possible ethylene glycol intoxication ; -- Monitor closely  -- if patient found to have high anion gap metabolic acidosis  then patient needs emergent hemodialysis   DVT of upper extremity. patient now a supratherapeutic INR of 4. 3, but no evidence of bleeding. Will monitor for bleed and have pharmacy adjust warfarin accordingly. - Warfarin per pharmacy  OSA- Now on BiPAP. Has official split night sleep study on 03/29/12.  DVT: Warfarin    Tobacco abuse - nicotine patch     Signed: Nyala Kirchner 03/20/2012, 2:58 AM

## 2012-03-19 NOTE — ED Provider Notes (Signed)
History     CSN: [id number]  Arrival date & time 03/19/12  2105   First MD Initiated Contact with Patient 03/19/12 2123      Chief Complaint  Patient presents with  . Respiratory Distress    (Consider location/radiation/quality/duration/timing/severity/associated sxs/prior treatment) The history is provided by the patient.  pt with hx copd, right heart failure, worsening sob in past day. States uses o2 3 liters continuously and cpap at night. States occasionally at night cpap mask moves and will awaken with increased wob. States was seen by cardiologist and admitted to hospital in past month regarding increased leg edema, and recent dx subclavian dvt. Is on coumadin. States is on demadex.  States compliant w meds. Lower leg edema sl worse, +orthopnea. No chest pain. No cough or fever.  This evening had fallen asleep, cpap mask off, spouse awoke and was very sob. ems noted pt hypertensive in route, respiratory status slowly improving.   Past Medical History  Diagnosis Date  . COPD (chronic obstructive pulmonary disease)     no documented PFTs, but patient said it was confirmed  . Chronic asthma   . History of alcoholism     sober last 5 years  . Tobacco abuse   . Irregular menses     Past Surgical History  Procedure Date  . Cardiac surgery   . Lung surgery     Family History  Problem Relation Age of Onset  . Hypertension Father   . Heart disease Father     CHF  . Alcohol abuse Father     History  Substance Use Topics  . Smoking status: Current Everyday Smoker -- 1.0 packs/day for 25 years    Types: Cigarettes  . Smokeless tobacco: Not on file   Comment: quit smoking 1 week ago  . Alcohol Use: Yes     One drink per week    OB History    Grav Para Term Preterm Abortions TAB SAB Ect Mult Living                  Review of Systems  Constitutional: Negative for fever and chills.  HENT: Negative for neck pain.   Eyes: Negative for redness.  Respiratory: Positive  for shortness of breath. Negative for cough.   Cardiovascular: Negative for chest pain.  Gastrointestinal: Negative for vomiting and abdominal pain.  Genitourinary: Negative for flank pain.  Musculoskeletal: Negative for back pain.  Skin: Negative for rash.  Neurological: Negative for headaches.  Hematological: Does not bruise/bleed easily.  Psychiatric/Behavioral: Negative for decreased concentration.    Allergies  Review of patient's allergies indicates no known allergies.  Home Medications   Current Outpatient Rx  Name Route Sig Dispense Refill  . ALBUTEROL SULFATE HFA 108 (90 BASE) MCG/ACT IN AERS Inhalation Inhale 2 puffs into the lungs every 6 (six) hours as needed. For shortness of breath    . FLUTICASONE-SALMETEROL 250-50 MCG/DOSE IN AEPB Inhalation Inhale 1 puff into the lungs every 12 (twelve) hours.    Marland Kitchen HYDROCODONE-ACETAMINOPHEN 10-325 MG PO TABS Oral Take 0.5 tablets by mouth every 6 (six) hours as needed. For pain    . IBUPROFEN 600 MG PO TABS Oral Take 600 mg by mouth every 6 (six) hours as needed. For pain    . WARFARIN SODIUM 7.5 MG PO TABS Oral Take 1 tablet (7.5 mg total) by mouth daily. TODAY ONLY (April 6th, 2013) take 1 and 1/2 tablets by mouth. 45 tablet 0    BP 125/91  Pulse 110  Temp(Src) 98.7 F (37.1 C) (Oral)  Resp 24  SpO2 91%  LMP 01/28/2012  Physical Exam  Nursing note and vitals reviewed. Constitutional: She is oriented to person, place, and time. She appears well-developed and well-nourished. No distress.  HENT:  Head: Atraumatic.  Nose: Nose normal.  Mouth/Throat: Oropharynx is clear and moist.  Eyes: Conjunctivae are normal. No scleral icterus.  Neck: Neck supple. No JVD present. No tracheal deviation present.  Cardiovascular: Normal rate, regular rhythm, normal heart sounds and intact distal pulses.   Pulmonary/Chest: Effort normal. No respiratory distress.       ?rales bases. Sl wheezing.   Abdominal: Soft. Normal appearance and bowel  sounds are normal. She exhibits no distension. There is no tenderness.       obese  Musculoskeletal: She exhibits edema. She exhibits no tenderness.       2-3+ pitting edema bilateral legs to thigh. No calf tenderness  Neurological: She is alert and oriented to person, place, and time.  Skin: Skin is warm and dry. No rash noted.  Psychiatric: She has a normal mood and affect.    ED Course  Procedures (including critical care time)   Labs Reviewed  COMPREHENSIVE METABOLIC PANEL  CBC  DIFFERENTIAL  PROTIME-INR  TROPONIN I  URINALYSIS, ROUTINE W REFLEX MICROSCOPIC  PRO B NATRIURETIC PEPTIDE   Results for orders placed during the hospital encounter of 03/19/12  COMPREHENSIVE METABOLIC PANEL      Component Value Range   Sodium 133 (*) 135 - 145 (mEq/L)   Potassium 5.5 (*) 3.5 - 5.1 (mEq/L)   Chloride 92 (*) 96 - 112 (mEq/L)   CO2 33 (*) 19 - 32 (mEq/L)   Glucose, Bld 115 (*) 70 - 99 (mg/dL)   BUN 46 (*) 6 - 23 (mg/dL)   Creatinine, Ser 1.61 (*) 0.50 - 1.10 (mg/dL)   Calcium 09.6  8.4 - 10.5 (mg/dL)   Total Protein 7.3  6.0 - 8.3 (g/dL)   Albumin 3.6  3.5 - 5.2 (g/dL)   AST 39 (*) 0 - 37 (U/L)   ALT 42 (*) 0 - 35 (U/L)   Alkaline Phosphatase 87  39 - 117 (U/L)   Total Bilirubin 0.3  0.3 - 1.2 (mg/dL)   GFR calc non Af Amer 55 (*) >90 (mL/min)   GFR calc Af Amer 64 (*) >90 (mL/min)  CBC      Component Value Range   WBC 14.8 (*) 4.0 - 10.5 (K/uL)   RBC 5.41 (*) 3.87 - 5.11 (MIL/uL)   Hemoglobin 16.5 (*) 12.0 - 15.0 (g/dL)   HCT 04.5 (*) 40.9 - 46.0 (%)   MCV 96.9  78.0 - 100.0 (fL)   MCH 30.5  26.0 - 34.0 (pg)   MCHC 31.5  30.0 - 36.0 (g/dL)   RDW 81.1 (*) 91.4 - 15.5 (%)   Platelets 306  150 - 400 (K/uL)  DIFFERENTIAL      Component Value Range   Neutrophils Relative 73  43 - 77 (%)   Neutro Abs 10.7 (*) 1.7 - 7.7 (K/uL)   Lymphocytes Relative 14  12 - 46 (%)   Lymphs Abs 2.1  0.7 - 4.0 (K/uL)   Monocytes Relative 12  3 - 12 (%)   Monocytes Absolute 1.8 (*) 0.1 -  1.0 (K/uL)   Eosinophils Relative 1  0 - 5 (%)   Eosinophils Absolute 0.1  0.0 - 0.7 (K/uL)   Basophils Relative 0  0 - 1 (%)  Basophils Absolute 0.0  0.0 - 0.1 (K/uL)  PROTIME-INR      Component Value Range   Prothrombin Time 42.2 (*) 11.6 - 15.2 (seconds)   INR 4.34 (*) 0.00 - 1.49   TROPONIN I      Component Value Range   Troponin I <0.30  <0.30 (ng/mL)  PRO B NATRIURETIC PEPTIDE      Component Value Range   Pro B Natriuretic peptide (BNP) 5727.0 (*) 0 - 125 (pg/mL)   Dg Chest 2 View  03/06/2012  *RADIOLOGY REPORT*  Clinical Data: Right-sided chest pain.  Swelling.  CHEST - 2 VIEW  Comparison: 01/24/2012.  Findings: Peribronchial thickening may represent changes of chronic bronchitis.  Basilar scarring.  No segmental infiltrate or pneumothorax.  Prominence right hilar region.  This may represent overlapping vascular structures however, adenopathy or mass not excluded.  Heart size top normal.  IMPRESSION: Peribronchial thickening may represent changes of chronic bronchitis.  Prominence right hilar region.  This may represent overlapping vascular structures however, adenopathy or mass not excluded.  Original Report Authenticated By: Fuller Canada, M.D.   Ct Angio Chest W/cm &/or Wo Cm  03/07/2012  **ADDENDUM** CREATED: 03/07/2012 15:35:11  Per discussion with Dr. Bard Herbert, the patient had a right heart cath which  demonstrated elevated right heart pressures which corresponds to the enlarged right atrium and right ventricle and right main pulmonary artery and most likely is responsible for the ascites and third spacing of fluid.  With regard to the possibility pulmonary embolus, the present examination is limited by respiratory motion. Separate from the motion artifact is suggestion of a tiny filling defect within right middle lobe pulmonary branch vessel (series 6 images 105 through 108, series 602 image 31 and is series 603 image 43).  Chronic lung changes may also contribute to the elevated  right heart pressures.  **END ADDENDUM** SIGNED BY: Almedia Balls. Constance Goltz, M.D.    03/06/2012  **ADDENDUM** CREATED: 03/06/2012 10:02:30  Contrast utilized was 100 ml of Omnipaque 350.  **END ADDENDUM** SIGNED BY: Almedia Balls. Constance Goltz, M.D.    03/06/2012  *RADIOLOGY REPORT*  Clinical Data: Right upper extremity swelling.  Question abnormal chest x-ray.  CT ANGIOGRAPHY CHEST  Technique:  Multidetector CT imaging of the chest using the standard protocol during bolus administration of intravenous contrast. Multiplanar reconstructed images including MIPs were obtained and reviewed to evaluate the vascular anatomy.  Contrast:  Comparison: 03/06/2012 chest x-ray.  Findings: Filling defect right internal jugular vein/subclavian vein junction just proximal to the formation of the superior vena cava involving the right subclavian vein.  Marked  right chest wall edema involving the right breast and right axilla.  Right axillary adenopathy.  No pulmonary embolus (probable motion artifact involving the right lower lobe pulmonary vessels).  No aortic dissection.  Mediastinal and right hilar adenopathy.  Scattered pulmonary parenchymal changes without dominant worrisome mass.  Patchy parenchymal changes most notable right upper lobe. This can be reevaluated follow-up after acute episode has cleared.  Small right-sided pleural effusion.  Right heart enlargement. Small superiorly located pericardial effusion.  No bony destructive lesion.  Ascites.  Slight lobularity liver without other findings of cirrhosis.  Mild third spacing of fluid abdominal wall.  IMPRESSION: Thrombosed right subclavian vein.  Marked  right chest wall edema involving the right breast and right axilla.  Right axillary adenopathy.  No pulmonary embolus (probable motion artifact involving the right lower lobe pulmonary vessels).  Mediastinal and right hilar adenopathy.  Scattered pulmonary parenchymal changes without dominant  worrisome mass.  Patchy parenchymal changes most  notable right upper lobe. This can be reevaluated follow-up after acute episode has cleared.  Small right-sided pleural effusion.  Right heart enlargement. Small superiorly located pericardial effusion.  Ascites.  Slight lobularity liver without other findings of cirrhosis.  Mild third spacing of fluid abdominal wall.  Original Report Authenticated By: Fuller Canada, M.D.   US Abdomen Complete  02/28/2012  *RADIOLOGY REPORT*  Clinical Data:  Abdominal pain and bloating.  COMPLETE ABDOMINAL ULTRASOUND  Comparison:  None.  Findings:  Gallbladder:  Contracted.  No stones, wall thickening or sonographic Murphy's sign.  Common bile duct:  Measures 4 mm, within normal limits.  Liver:  Appears mildly increased in echogenicity.  No focal lesions.  IVC:  Visualization is limited by bowel gas.  Pancreas:  No focal abnormality seen.  Spleen:  Measures 11.9 cm, negative.  Right Kidney:  Measures 11.9 cm with normal parenchymal echogenicity.  No hydronephrosis.  Left Kidney:  Measures 11.4 cm with normal parenchymal echogenicity.  No hydronephrosis.  Abdominal aorta:  Mid and distal abdominal aorta are obscured by bowel gas.  Otherwise, no aneurysm.  Additional finding:  Small amount of ascites is seen in the left lower quadrant and pelvis.  No postvoid residual in the bladder.  IMPRESSION:  1.  Small ascites in the left lower quadrant and anatomic pelvis. 2.  No evidence of urinary retention. 3.  Question hepatic steatosis.  Original Report Authenticated By: Reyes Ivan, M.D.   Ct Abdomen Pelvis W Contrast  03/07/2012  *RADIOLOGY REPORT*  Clinical Data: Right-sided edema and adenopathy  CT ABDOMEN AND PELVIS WITH CONTRAST  Technique:  Multidetector CT imaging of the abdomen and pelvis was performed following the standard protocol during bolus administration of intravenous contrast.  Contrast:  80 ml of omni 300  Comparison: None.  Findings:  There are small bilateral pleural effusions, right greater than left.  The  mild interlobular septal thickening is noted consistent with mild interstitial edema.  There is diffuse body wall edema identified.  Focal area of low density adjacent to the falciform ligament is identified unlike lie represents focal fatty deposition.  There is mild edema of the gallbladder wall.  Non-specific in a hypoproteinemic and volume overload state.  No biliary dilatation.  The pancreas appears normal.  The spleen is negative.  Both adrenal glands are normal.  The kidneys are both unremarkable.  There are multiple upper abdominal lymph nodes but no evidence for adenopathy.  There is no pelvic or inguinal adenopathy. Intermediate density fluid within the urinary bladder is present.  This may reflect excreted contrast material from recent chest CT.  There is no pelvic or inguinal adenopathy.  The stomach and the small bowel loops appear normal.  The appendix is identified and is normal.  The colon is negative.  Moderate ascites identified within the abdomen and pelvis.  IMPRESSION:  1.  Findings compatible with fluid overload state with ascites, body wall edema, and pleural effusions. 2.  No mass or adenopathy identified.  Original Report Authenticated By: Rosealee Albee, M.D.   Dg Chest Port 1 View  03/19/2012  *RADIOLOGY REPORT*  Clinical Data: Shortness of breath.  PORTABLE CHEST - 1 VIEW  Comparison: Chest radiograph and CTA of the chest performed 03/06/2012  Findings: The lungs are well-aerated.  On the prior study, note was made of mild patchy airspace opacity, which could have reflected mild pneumonia or possibly atelectasis; this is slightly more apparent at  the lung bases on the current study.  There is no evidence of pleural effusion or pneumothorax.  The cardiomediastinal silhouette is within normal limits.  No acute osseous abnormalities are seen.  IMPRESSION: On the prior CTA, note was made of mild patchy airspace opacity, which could have reflected mild pneumonia; this is slightly more  apparent at the lung bases on the current study, raising concern for persistent mild pneumonia.  Would complete treatment for pneumonia, then consider performing follow-up CT in several weeks as previously suggested, to ensure resolution of vague right upper lobe airspace opacity.  Original Report Authenticated By: Tonia Ghent, M.D.      MDM  Iv ns. Labs. Cxr. Ecg. Albuterol and atrovent neb.     Date: 03/19/2012  Rate: 111  Rhythm: sinus tachycardia  QRS Axis: right  Intervals: normal  ST/T Wave abnormalities: normal  Conduction Disutrbances:none  Narrative Interpretation:   Old EKG Reviewed: changes noted   Decreased wheezing post intiial neb. Will give additional tx.  reviewded labs/ xrays.   Renal fxn inc compared to prior, k sl elev.  bnp increased from prior.  Trop neg.  cxr with ?right density/inf - note that patient denies cough, denies fever or chills. As clinical picture (acute sob tonight, edema, right heart failure along with no fever or cough) not felt c/w acute bacterial pna, and more likely to effusion/fluid overload - therefore feel abx not currently indicated - discussed w admitting service who agrees. If during hospital stay pt w fevers, cough, and exam c/w pna, may need abx then,  Will admit to med service for further evaluation.      Suzi Roots, MD 03/19/12 [phone]

## 2012-03-19 NOTE — ED Notes (Signed)
Per EMS, pt was at home sleeping, family heard her gasp, tried waking her up, pt on home bipap, family had difficulty waking; upon EMS arrival, she was lethargic but awake and oriented; spo2 60's on 2L [place], placed on 4L and 92%; cBg 128; NSR on monitor; lungs congested in lower bases; pt Hypertensive en route 190/140, HR 112; pt reports increased swelling in abd and legs; no meds given en route; pt placed on 3L en route and sp2 maintained in 90's

## 2012-03-20 ENCOUNTER — Inpatient Hospital Stay (HOSPITAL_COMMUNITY): Payer: 59

## 2012-03-20 ENCOUNTER — Encounter (HOSPITAL_COMMUNITY): Payer: Self-pay | Admitting: Internal Medicine

## 2012-03-20 ENCOUNTER — Ambulatory Visit: Payer: 59

## 2012-03-20 ENCOUNTER — Other Ambulatory Visit (HOSPITAL_COMMUNITY): Payer: 59

## 2012-03-20 DIAGNOSIS — R0603 Acute respiratory distress: Secondary | ICD-10-CM | POA: Diagnosis present

## 2012-03-20 DIAGNOSIS — E662 Morbid (severe) obesity with alveolar hypoventilation: Secondary | ICD-10-CM

## 2012-03-20 DIAGNOSIS — J984 Other disorders of lung: Secondary | ICD-10-CM

## 2012-03-20 DIAGNOSIS — R791 Abnormal coagulation profile: Secondary | ICD-10-CM | POA: Diagnosis present

## 2012-03-20 DIAGNOSIS — J9620 Acute and chronic respiratory failure, unspecified whether with hypoxia or hypercapnia: Principal | ICD-10-CM

## 2012-03-20 DIAGNOSIS — I509 Heart failure, unspecified: Secondary | ICD-10-CM

## 2012-03-20 DIAGNOSIS — R0609 Other forms of dyspnea: Secondary | ICD-10-CM

## 2012-03-20 LAB — SODIUM, URINE, RANDOM: Sodium, Ur: <10 mEq/L

## 2012-03-20 LAB — BLOOD GAS, ARTERIAL
Acid-Base Excess: 11.7 mmol/L — ABNORMAL HIGH (ref 0.0–2.0)
Bicarbonate: 37 mEq/L — ABNORMAL HIGH (ref 20.0–24.0)
Bicarbonate: 38 mEq/L — ABNORMAL HIGH (ref 20.0–24.0)
Expiratory PAP: 5
FIO2: 0.4 %
FIO2: 0.4 %
Inspiratory PAP: 15
Mode: POSITIVE
O2 Saturation: 94.8 %
Patient temperature: 98.6
TCO2: 39.3 mmol/L (ref 0–100)
TCO2: 40.3 mmol/L (ref 0–100)
pCO2 arterial: 76 mmHg (ref 35.0–45.0)
pCO2 arterial: 75.5 mmHg (ref 35.0–45.0)
pH, Arterial: 7.309 — ABNORMAL LOW (ref 7.350–7.400)
pO2, Arterial: 76.1 mmHg — ABNORMAL LOW (ref 80.0–100.0)

## 2012-03-20 LAB — URINALYSIS, ROUTINE W REFLEX MICROSCOPIC
Bilirubin Urine: NEGATIVE
Glucose, UA: NEGATIVE mg/dL
Hgb urine dipstick: NEGATIVE
Ketones, ur: NEGATIVE mg/dL
Nitrite: POSITIVE — AB
Specific Gravity, Urine: 1.021 (ref 1.005–1.030)
pH: 5 (ref 5.0–8.0)

## 2012-03-20 LAB — CARDIAC PANEL(CRET KIN+CKTOT+MB+TROPI)
CK, MB: 2.8 ng/mL (ref 0.3–4.0)
CK, MB: 2.8 ng/mL (ref 0.3–4.0)
CK, MB: 3 ng/mL (ref 0.3–4.0)
Relative Index: INVALID (ref 0.0–2.5)
Relative Index: INVALID (ref 0.0–2.5)
Total CK: 28 U/L (ref 7–177)
Total CK: 32 U/L (ref 7–177)

## 2012-03-20 LAB — CBC
HCT: 48.9 % — ABNORMAL HIGH (ref 36.0–46.0)
MCH: 29.8 pg (ref 26.0–34.0)
MCHC: 31.3 g/dL (ref 30.0–36.0)
MCV: 95.3 fL (ref 78.0–100.0)
Platelets: 257 10*3/uL (ref 150–400)
RDW: 15.4 % (ref 11.5–15.5)
WBC: 13.7 10*3/uL — ABNORMAL HIGH (ref 4.0–10.5)

## 2012-03-20 LAB — OSMOLALITY, URINE: Osmolality, Ur: 336 mOsm/kg — ABNORMAL LOW (ref 390–1090)

## 2012-03-20 LAB — ETHYLENE GLYCOL: Ethylene Glycol Lvl: <5

## 2012-03-20 LAB — POCT I-STAT 3, ART BLOOD GAS (G3+)
Bicarbonate: 34.6 mEq/L — ABNORMAL HIGH (ref 20.0–24.0)
Bicarbonate: 36.5 mEq/L — ABNORMAL HIGH (ref 20.0–24.0)
O2 Saturation: 100 %
Patient temperature: 98.6
TCO2: 37 mmol/L (ref 0–100)
TCO2: 39 mmol/L (ref 0–100)
pCO2 arterial: 97 mmHg (ref 35.0–45.0)
pH, Arterial: 7.271 — ABNORMAL LOW (ref 7.350–7.400)
pO2, Arterial: 95 mmHg (ref 80.0–100.0)

## 2012-03-20 LAB — BASIC METABOLIC PANEL
BUN: 38 mg/dL — ABNORMAL HIGH (ref 6–23)
Calcium: 9.5 mg/dL (ref 8.4–10.5)
Chloride: 91 mEq/L — ABNORMAL LOW (ref 96–112)
Creatinine, Ser: 0.85 mg/dL (ref 0.50–1.10)
GFR calc Af Amer: >90 mL/min (ref >90)

## 2012-03-20 LAB — OSMOLALITY: Osmolality: 289 mOsm/kg (ref 275–300)

## 2012-03-20 LAB — URINE MICROSCOPIC-ADD ON

## 2012-03-20 LAB — LACTIC ACID, PLASMA: Lactic Acid, Venous: 0.9 mmol/L (ref 0.5–2.2)

## 2012-03-20 MED ORDER — SODIUM BICARBONATE 8.4 % IV SOLN
INTRAVENOUS | Status: DC
  Administered 2012-03-20: 05:00:00 via INTRAVENOUS
  Filled 2012-03-20 (×2): qty 1000

## 2012-03-20 MED ORDER — PANTOPRAZOLE SODIUM 40 MG PO TBEC
40.0000 mg | DELAYED_RELEASE_TABLET | Freq: Every day | ORAL | Status: DC
  Administered 2012-03-20 – 2012-03-21 (×2): 40 mg via ORAL
  Filled 2012-03-20 (×3): qty 1

## 2012-03-20 MED ORDER — FUROSEMIDE 10 MG/ML IJ SOLN
80.0000 mg | Freq: Three times a day (TID) | INTRAMUSCULAR | Status: DC
  Administered 2012-03-20: 80 mg via INTRAVENOUS
  Filled 2012-03-20: qty 8

## 2012-03-20 MED ORDER — WARFARIN - PHARMACIST DOSING INPATIENT: Freq: Every day | Status: DC

## 2012-03-20 MED ORDER — ONDANSETRON HCL 4 MG/2ML IJ SOLN: 4.0000 mg | Freq: Four times a day (QID) | INTRAMUSCULAR | Status: DC | PRN

## 2012-03-20 MED ORDER — FOLIC ACID 1 MG PO TABS
1.0000 mg | ORAL_TABLET | Freq: Every day | ORAL | Status: DC
  Administered 2012-03-20 – 2012-03-22 (×3): 1 mg via ORAL
  Filled 2012-03-20 (×3): qty 1

## 2012-03-20 MED ORDER — LORAZEPAM 1 MG PO TABS: 1.0000 mg | ORAL_TABLET | Freq: Four times a day (QID) | ORAL | Status: DC | PRN

## 2012-03-20 MED ORDER — DEXTROSE 5 % IV SOLN
1.0000 g | INTRAVENOUS | Status: DC
  Administered 2012-03-20 – 2012-03-22 (×3): 1 g via INTRAVENOUS
  Filled 2012-03-20 (×3): qty 10

## 2012-03-20 MED ORDER — NICOTINE 21 MG/24HR TD PT24
21.0000 mg | MEDICATED_PATCH | Freq: Every day | TRANSDERMAL | Status: DC
  Administered 2012-03-22: 21 mg via TRANSDERMAL
  Filled 2012-03-20 (×3): qty 1

## 2012-03-20 MED ORDER — VITAMIN B-1 100 MG PO TABS
100.0000 mg | ORAL_TABLET | Freq: Every day | ORAL | Status: DC
  Administered 2012-03-20 – 2012-03-22 (×3): 100 mg via ORAL
  Filled 2012-03-20 (×3): qty 1

## 2012-03-20 MED ORDER — SODIUM CHLORIDE 0.9 % IV SOLN
1500.0000 mg | Freq: Once | INTRAVENOUS | Status: AC
  Administered 2012-03-20: 1500 mg via INTRAVENOUS
  Filled 2012-03-20: qty 1.5

## 2012-03-20 MED ORDER — LORAZEPAM 2 MG/ML IJ SOLN: 1.0000 mg | Freq: Four times a day (QID) | INTRAMUSCULAR | Status: DC | PRN

## 2012-03-20 MED ORDER — THIAMINE HCL 100 MG/ML IJ SOLN
100.0000 mg | Freq: Every day | INTRAMUSCULAR | Status: DC
  Filled 2012-03-20 (×3): qty 1

## 2012-03-20 MED ORDER — SODIUM CHLORIDE 0.9 % IJ SOLN
3.0000 mL | Freq: Two times a day (BID) | INTRAMUSCULAR | Status: DC
  Administered 2012-03-20 – 2012-03-22 (×5): 3 mL via INTRAVENOUS

## 2012-03-20 MED ORDER — ADULT MULTIVITAMIN W/MINERALS CH
1.0000 | ORAL_TABLET | Freq: Every day | ORAL | Status: DC
  Administered 2012-03-20 – 2012-03-22 (×2): 1 via ORAL
  Filled 2012-03-20 (×3): qty 1

## 2012-03-20 MED ORDER — ACETAMINOPHEN 325 MG PO TABS
650.0000 mg | ORAL_TABLET | Freq: Four times a day (QID) | ORAL | Status: DC | PRN
  Administered 2012-03-20 – 2012-03-22 (×4): 650 mg via ORAL
  Filled 2012-03-20 (×4): qty 2

## 2012-03-20 MED ORDER — SODIUM CHLORIDE 0.9 % IV SOLN
1000.0000 mg | Freq: Two times a day (BID) | INTRAVENOUS | Status: DC
  Administered 2012-03-20: 1000 mg via INTRAVENOUS
  Filled 2012-03-20 (×2): qty 1

## 2012-03-20 MED ORDER — ONDANSETRON HCL 4 MG PO TABS: 4.0000 mg | ORAL_TABLET | Freq: Four times a day (QID) | ORAL | Status: DC | PRN

## 2012-03-20 MED ORDER — MORPHINE SULFATE 2 MG/ML IJ SOLN: 0.5000 mg | INTRAMUSCULAR | Status: DC | PRN

## 2012-03-20 MED ORDER — ALBUTEROL SULFATE (5 MG/ML) 0.5% IN NEBU: 2.5000 mg | INHALATION_SOLUTION | RESPIRATORY_TRACT | Status: DC | PRN

## 2012-03-20 NOTE — Progress Notes (Signed)
Mild confusion, ABG unchanged from earlier in day. CIWA range 5-9 throughout last hour. MD notified of ABG results and of CIWA score. Will continue to monitor for further changes.

## 2012-03-20 NOTE — H&P (Signed)
Internal Medicine Teaching Service Attending Note Date: 03/20/2012  Patient name: Mary [name]  Medical record number: [id number]  Date of birth: 1976-02-07   I have seen and evaluated Loura Pardon and discussed their care with the Residency Team. We saw and examined Ms Starkes on AM rounds.  Please see Dr Raisch's H&P for full details. Briefly, Ms Wurtz is a 36 yo with R heart failure 2/2 OSA, Pul HTN, and PE, also with RUE DVT. She was recently D/C after W/U and dx of these conditions with appropriate F/U. She had sxs of fatigue, DOE, N/V, increaseing edema after D/C. Ms Arey's BiPAP was not working properly and was not able to use it all night. She also drank Vodka and took her husband's hydrocodone on day prior to admit. She remembers being on the couch and OK and then remembers bits of the ER stay. Remembers the intubation discussion. She was confused and obtunded. She was found to be in resp acidosis with a pH of 7.18 and PCO2 97 and was placed on BiPAP. On BiPAP her mental status has improved and her ABG improved to pH 7.31 and PCO2 of 76.   Her vital signs were stable and was 92% on O2 by [place]. + edema on LE to knees, about +1. She was moving all four extremities. No focal neuro deficits.  A/P 1. Acute on chronic resp failure - likely combo of underlying RHF, OSA, Pul HTN, ETOH, and opioids. She has responded well and we will try her off BiPAP and repeat ABG. CCM consult appreciated.   2. ETOH ingestion - pt denies ingestion anything other than store bought Vodka. Her MS has improved. No further intervention needed except ETOH cessation.  3. ARF - likely pre-renal. Follow.  4. RUE DVT - pharmacy for anticoag dosing.  5. Economics - she will need orange card / medicaid.   I agree with the formulated Assessment and Plan with the following changes

## 2012-03-20 NOTE — Consult Note (Signed)
Name: Mary [name] MRN: [id number] DOB: 25-Aug-1976  LOS: 1  CRITICAL CARE CONSULT NOTE  History of Present Illness: Patient is a 36 yo F with PMH significant for COPD, sleep apnea, asthma and h/o tobacco & EtOH abuse & h/o recent PE/DVT on coumadin who presented to [facility] ED with c/o gasping/SOB after 1d of N/V.  She last vomited on the night prior to admission. Husband is at bedside and provides much of history.  No cough, fever or sick contacts.  Increased lethargy over the last day, to the point that her husband had to help her bathe on day of admission.  She has recently started CPAP at night, but mask has been broken/straps not fitting appropriately for the last couple of days.  She is also on 3L continuous home O2.   Patient was recently d/c from [facility] on 03/11/12 with dx of right subclavian DVT/ PE (see CTA chest from 03/07/12), pulmonary HTN with right heart failure, OSA and cirrhosis. At that admission, she had cardiac cath that revealed elevated pulm artery & right sided filling pressures.  She had several follow up appointments, including appt with Dr. Jens Som on 03/23/12 for right heart failure and with Dr. Marchelle Gearing on 03/2512 for pulm HTN & obstructive lung disease   CCM was consulted given patient's lethargy on exam and respiratory acidosis evidenced by ABG.  Lines / Drains: None  Cultures / Sepsis markers: None  Antibiotics: None  Tests / Events: 4/14 CXR >>> ?persistent mild PNA     Past Medical History  Diagnosis Date  . COPD (chronic obstructive pulmonary disease)     no documented PFTs, but patient said it was confirmed  . Chronic asthma   . History of alcoholism     sober last 5 years  . Tobacco abuse   . Irregular menses   . DVT of upper extremity (deep vein thrombosis) April 2013    right subclavian  . Pulmonary embolus April 2013  . Pulmonary HTN April 2013    Cardiac cath on 03/06/12  . OSA (obstructive sleep apnea)   . Cirrhosis    Past Surgical History    Procedure Date  . Cardiac surgery   . Lung surgery    Prior to Admission medications   Medication Sig Start Date End Date Taking? Authorizing Provider  albuterol (PROVENTIL HFA;VENTOLIN HFA) 108 (90 BASE) MCG/ACT inhaler Inhale 2 puffs into the lungs every 6 (six) hours as needed. For shortness of breath   Yes Historical Provider, MD  Fluticasone-Salmeterol (ADVAIR) 250-50 MCG/DOSE AEPB Inhale 1 puff into the lungs every 12 (twelve) hours.   Yes Historical Provider, MD  HYDROcodone-acetaminophen (NORCO) 10-325 MG per tablet Take 0.5 tablets by mouth every 6 (six) hours as needed. For pain   Yes Historical Provider, MD  ibuprofen (ADVIL,MOTRIN) 600 MG tablet Take 600 mg by mouth every 6 (six) hours as needed. For pain   Yes Historical Provider, MD  warfarin (COUMADIN) 7.5 MG tablet Take 1 tablet (7.5 mg total) by mouth daily. TODAY ONLY (April 6th, 2013) take 1 and 1/2 tablets by mouth. 03/11/12 03/11/13 Yes Linward Headland, MD   No Known Allergies  Family History  Problem Relation Age of Onset  . Hypertension Father   . Heart disease Father     CHF  . Alcohol abuse Father    Social History  reports that she has been smoking Cigarettes.  She has a 25 pack-year smoking history. She does not have any smokeless tobacco history on  file. She reports that she drinks alcohol. She reports that she does not use illicit drugs.  Review Of Systems   Gen: Denies fever, chills, weight change, fatigue, night sweats HEENT: Denies blurred vision, double vision, hearing loss, tinnitus, sinus congestion, rhinorrhea, sore throat, neck stiffness, dysphagia PULM: Denies cough, sputum production, hemoptysis, wheezing CV: Denies chest pain, edema, palpitations GI: Denies abdominal pain, diarrhea, hematochezia, melena, constipation, change in bowel habits GU: Denies dysuria, hematuria, polyuria, oliguria, urethral discharge Endocrine: Denies hot or cold intolerance, polyuria, polyphagia or appetite change Neuro:  Denies numbness, weakness, slurred speech, loss of memory or consciousness  Vital Signs:    Filed Vitals:   03/20/12 0130 03/20/12 0145 03/20/12 0200 03/20/12 0244  BP: 105/70 102/75 109/69   Pulse: 89 90 90 93  Temp:      TempSrc:      Resp: 19 20 20 18   SpO2: 96% 97% 94% 96%  Temp: 98.7 Patient saturating 100% on Bipap during interview  Physical Examination: Gen: no acute distress HEENT: PERRL, EOMi, OP clear, no conjunctival pallor PULM: CTA B/l, no wheezing/rales/rhonchi CV: RRR, no mgr AB: BS+, soft, nontender Ext: b/l LE pitting edema (2+) Derm: no rash  Neuro: A&Ox4, CN II-XII intact, strength 5/5 in all 4 extremities  Labs and Imaging:  Reviewed.  Please refer to the Assessment and Plan section for relevant results.  Assessment and Plan:  #Acute on Chronic Respiratory Acidosis: Given h/o COPD/OSA, patient likely chronically retains CO2, but elevation from 1 week prior suggests acute retention, possibly d/t malfunction/noncompliance of home CPAP.  Acidotic state improved (pH>7.2) with BiPap in ED.    -Admit to SDU with continuous BiPap -Repeat ABG and closely monitor -Hold all sedative medications (including ativan/morphine) - will need to monitor closely with CIWA obs given h/o of significant EtOH abuse  #Acute Renal Insuffiencey: possibly prerenal in setting of vomiting.  Urine micro reveals calcium oxalate crystals, which may suggest nephrolithiasis vs ethylene glycol poisoning (but no anion gap). UA positive for nitrite & micro reveals many bacteria, but asymptomatic. -Mgmt per primary team (checking FEna), holding IVF in setting of possible fluid overload  #Elevated pBNP: 2D echo in 03/2012 EF 55-60% with moderately dilated RV & RA; may be further elevated in setting of acute renal insufficiency   #Polysubstance abuse: Patient's husband notes significant h/o EtOH and tobacco abuse. -Per primary team, suggest CIWA obs (avoid sedatives in setting of resp  insufficiency). Smoking cessation consult, nicotine patch  #h/o DVT/PE: Patient is supra-therapuetic on coumadin -hold coumadin, monitor INR & CBC daily  Best practices / Disposition: Feeding/protein malnutrition:  Analgesia: None Sedation: None Thromboprophylaxis: supratherapuetic on coumadin HOB >30 degrees Ulcer prophylaxis: None Glucose control/hyperglycemia: None  The patient is critically ill with multiple organ systems failure and requires high complexity decision making for assessment and support, frequent evaluation and titration of therapies, application of advanced monitoring technologies and extensive interpretation of multiple databases. Critical Care Time devoted to patient care services described in this note is 45 minutes.  Vernice Jefferson, M.D. (PGY -1)    03/20/2012, 2:26 AM  I have seen and examined Ms. Buxton independently of Dr. Milbert Coulter and agree with her note above with the following exceptions.  Ms. Birkey has a poorly understood cause of pulmonary hypertension, major ddx would include secondary Pulm Hypertension (due to COPD, Obesity hypoventilation, both with chronic hypoxemia) vs. Primary PH (idiopathic, due to connective tissue disease vs. Hypothyroidism vs. HIV).  PVOD is an interesting consideration and would be supported  by the effusions, ggo, interlobular thickening and med lymphadenoapthy on the last CT.  However this is a very rare diagnosis only made by biopsy and the CT was limited by motion artifact.  I would also consider hepatopulmonary syndrome given her edema, drinking and skin exam worrisome for cirrhosis. Regardless of the cause of her PH, I suspect her PA pressure numbers (which I have reviewed) could be exaggerated by her history of congenital heart disease.  She appears to be better now, however she describes morning headaches and fatigue after changing from BIPAP to CPAP.  I suspect with her chronic hypercarbia that she would do better with BIPAP  at home rather than CPAP.  She should qualify based her her resting hypercapnea documented with multiple ABG's here.  I will place a social work consult to help her qualify for this and get it at home.    Otherwise I think she should probably be diuresed based on her exam which is consistent with volume overload.  Often in the setting of pulmonary hypertension acute renal failure improves with diuresis, so I would not be afraid to diurese her.  If it is planned to d/c her home soon, then the remainder of a work up for what I mentioned above could be done as an outpatient.  Otherwise, I would consider the following if she is to stay in house:  -Review CT Abdomen frmo 4/2 with radiology re: evidence of cirrhosis -check HIV  Yolonda Kida PCCM Pager: [phone] If no response, call [phone]

## 2012-03-20 NOTE — Progress Notes (Signed)
ANTICOAGULATION CONSULT NOTE - Initial Consult  Pharmacy Consult for coumadin  Indication: pulmonary embolus and DVT  No Known Allergies  Patient Measurements: Height: 5\' 2"  (157.5 cm) Weight: 214 lb 11.7 oz (97.4 kg) IBW/kg (Calculated) : 50.1  Heparin Dosing Weight:   Vital Signs: Temp: 97.7 F (36.5 C) (04/15 0400) Temp src: Axillary (04/15 0400) BP: 109/68 mmHg (04/15 0350) Pulse Rate: 95  (04/15 0350)  Labs:  Basename 03/20/12 0457 03/20/12 0015 03/19/12 2159 03/19/12 2158  HGB 15.3* -- -- 16.5*  HCT 48.9* -- -- 52.4*  PLT 257 -- -- 306  APTT -- -- -- --  LABPROT -- -- -- 42.2*  INR -- -- -- 4.34*  HEPARINUNFRC -- -- -- --  CREATININE 0.85 -- -- 1.25*  CKTOTAL -- 32 -- --  CKMB -- 3.0 -- --  TROPONINI -- <0.30 <0.30 --   Estimated Creatinine Clearance: 101.6 ml/min (by C-G formula based on Cr of 0.85).  Medical History: Past Medical History  Diagnosis Date  . COPD (chronic obstructive pulmonary disease)     no documented PFTs, but patient said it was confirmed  . Chronic asthma   . History of alcoholism     sober last 5 years  . Tobacco abuse   . Irregular menses   . DVT of upper extremity (deep vein thrombosis) April 2013    right subclavian  . Pulmonary embolus April 2013  . Pulmonary HTN April 2013    Cardiac cath on 03/06/12  . OSA (obstructive sleep apnea)   . Cirrhosis     Medications:  Prescriptions prior to admission  Medication Sig Dispense Refill  . albuterol (PROVENTIL HFA;VENTOLIN HFA) 108 (90 BASE) MCG/ACT inhaler Inhale 2 puffs into the lungs every 6 (six) hours as needed. For shortness of breath      . Fluticasone-Salmeterol (ADVAIR) 250-50 MCG/DOSE AEPB Inhale 1 puff into the lungs every 12 (twelve) hours.      Marland Kitchen HYDROcodone-acetaminophen (NORCO) 10-325 MG per tablet Take 0.5 tablets by mouth every 6 (six) hours as needed. For pain      . ibuprofen (ADVIL,MOTRIN) 600 MG tablet Take 600 mg by mouth every 6 (six) hours as needed. For pain       . warfarin (COUMADIN) 7.5 MG tablet Take 1 tablet (7.5 mg total) by mouth daily. TODAY ONLY (April 6th, 2013) take 1 and 1/2 tablets by mouth.  45 tablet  0    Assessment:Patient is a 36 yo F with PMH significant for COPD, sleep apnea, asthma and h/o tobacco & EtOH abuse & h/o recent PE/DVT on coumadin who presented to [facility] ED with c/o gasping/SOB after 1d of N/V. Lethargy and acidosis led to r/o of ethylene glycol injestion . Labs reveal calcium oxalate and hyaline casts. Fomepizole was begun until r/o or confirmation. inr OA was 4.34. Anticipating increased inr due to organ failure  if ingestion confirmed.     Goal of Therapy:  INR 2-3   Plan:  F/u daily inr hold coumadin for now. F/u labs and clinical progress.   Janice Coffin 03/20/2034,6:37 AM

## 2012-03-21 ENCOUNTER — Inpatient Hospital Stay (HOSPITAL_COMMUNITY): Admission: RE | Admit: 2012-03-21 | Payer: 59 | Source: Ambulatory Visit

## 2012-03-21 ENCOUNTER — Encounter (HOSPITAL_COMMUNITY): Payer: Self-pay | Admitting: *Deleted

## 2012-03-21 ENCOUNTER — Ambulatory Visit (HOSPITAL_COMMUNITY): Payer: 59

## 2012-03-21 DIAGNOSIS — I279 Pulmonary heart disease, unspecified: Secondary | ICD-10-CM

## 2012-03-21 DIAGNOSIS — J9612 Chronic respiratory failure with hypercapnia: Secondary | ICD-10-CM | POA: Diagnosis present

## 2012-03-21 DIAGNOSIS — I82629 Acute embolism and thrombosis of deep veins of unspecified upper extremity: Secondary | ICD-10-CM

## 2012-03-21 DIAGNOSIS — I2781 Cor pulmonale (chronic): Secondary | ICD-10-CM | POA: Diagnosis present

## 2012-03-21 LAB — COMPREHENSIVE METABOLIC PANEL
ALT: 29 U/L (ref 0–35)
AST: 26 U/L (ref 0–37)
Alkaline Phosphatase: 69 U/L (ref 39–117)
CO2: 36 mEq/L — ABNORMAL HIGH (ref 19–32)
Chloride: 96 mEq/L (ref 96–112)
Creatinine, Ser: 0.53 mg/dL (ref 0.50–1.10)
GFR calc Af Amer: >90 mL/min (ref >90)
GFR calc non Af Amer: >90 mL/min (ref >90)
Glucose, Bld: 88 mg/dL (ref 70–99)
Potassium: 4.4 mEq/L (ref 3.5–5.1)
Sodium: 139 mEq/L (ref 135–145)
Total Bilirubin: 0.4 mg/dL (ref 0.3–1.2)

## 2012-03-21 LAB — CBC
Hemoglobin: 14.3 g/dL (ref 12.0–15.0)
Platelets: 229 10*3/uL (ref 150–400)
RBC: 4.9 MIL/uL (ref 3.87–5.11)
WBC: 9 10*3/uL (ref 4.0–10.5)

## 2012-03-21 LAB — PROTIME-INR
INR: 3.47 — ABNORMAL HIGH (ref 0.00–1.49)
Prothrombin Time: 35.4 seconds — ABNORMAL HIGH (ref 11.6–15.2)

## 2012-03-21 LAB — SEDIMENTATION RATE: Sed Rate: 2 mm/hr (ref 0–22)

## 2012-03-21 MED ORDER — IBUPROFEN 400 MG PO TABS
400.0000 mg | ORAL_TABLET | Freq: Four times a day (QID) | ORAL | Status: DC | PRN
  Administered 2012-03-21 – 2012-03-22 (×2): 400 mg via ORAL
  Filled 2012-03-21 (×2): qty 1

## 2012-03-21 MED ORDER — FLUTICASONE-SALMETEROL 250-50 MCG/DOSE IN AEPB
1.0000 | INHALATION_SPRAY | Freq: Two times a day (BID) | RESPIRATORY_TRACT | Status: DC
  Administered 2012-03-21 – 2012-03-22 (×2): 1 via RESPIRATORY_TRACT
  Filled 2012-03-21: qty 14

## 2012-03-21 MED ORDER — FUROSEMIDE 40 MG PO TABS
40.0000 mg | ORAL_TABLET | Freq: Two times a day (BID) | ORAL | Status: DC
  Administered 2012-03-21 – 2012-03-22 (×3): 40 mg via ORAL
  Filled 2012-03-21 (×5): qty 1

## 2012-03-21 NOTE — Progress Notes (Signed)
ANTICOAGULATION CONSULT NOTE - Follow Up Consult  Pharmacy Consult for Coumadin Indication: recent DVT/PE  No Known Allergies  Vital Signs: Temp: 97.6 F (36.4 C) (04/16 1248) Temp src: Oral (04/16 1248) BP: 120/75 mmHg (04/16 1248) Pulse Rate: 97  (04/16 1248)  Labs:  Alvira Philips 03/21/12 0415 03/20/12 1651 03/20/12 0845 03/20/12 0457 03/20/12 0015 03/19/12 2158  HGB 14.3 -- -- 15.3* -- --  HCT 48.0* -- -- 48.9* -- 52.4*  PLT 229 -- -- 257 -- 306  APTT -- -- -- -- -- --  LABPROT 35.4* -- -- -- -- 42.2*  INR 3.47* -- -- -- -- 4.34*  HEPARINUNFRC -- -- -- -- -- --  CREATININE 0.53 -- -- 0.85 -- 1.25*  CKTOTAL -- 28 23 -- 32 --  CKMB -- 2.8 2.8 -- 3.0 --  TROPONINI -- <0.30 <0.30 -- <0.30 --   Estimated Creatinine Clearance: 103.4 ml/min (by C-G formula based on Cr of 0.53).  Assessment: 35yof on coumadin pta for recent DVT/PE (03/06/12) admitted with a supratherapeutic INR in setting of possible ethylene glycol intoxication. INR is decreased today but continues to be supratherapeutic despite holding last night's dose. No bleeding per chart notes.    Goal of Therapy:  INR 2-3   Plan:  1) Hold coumadin tonight 2) Follow up INR in AM  Fredrik Rigger 03/21/2012,1:36 PM

## 2012-03-21 NOTE — Progress Notes (Signed)
Subjective: The patient was taken off BiPap yesterday morning, received BiPap briefly yesterday evening, then again last night.  She notes significant improvement in respirations and mental status this morning, though she is still emotionally upset about yesterday's events.  Objective: Vital signs in last 24 hours: Filed Vitals:   03/20/12 2300 03/21/12 0423 03/21/12 0500 03/21/12 0800  BP: 131/91 112/72  124/81  Pulse: 96 86  94  Temp: 97.9 F (36.6 C) 98.4 F (36.9 C)  97.6 F (36.4 C)  TempSrc: Oral Oral  Oral  Resp: 22 18  22   Height:      Weight:   198 lb 13.7 oz (90.2 kg)   SpO2: 97% 95%  95%   Weight change: -15 lb 14 oz (-7.2 kg)  Intake/Output Summary (Last 24 hours) at 03/21/12 1122 Last data filed at 03/21/12 0800  Gross per 24 hour  Intake    103 ml  Output   1050 ml  Net   -947 ml   Physical Exam: General: alert, cooperative, mildly sad HEENT: pupils equal round and reactive to light, vision grossly intact, oropharynx clear and non-erythematous  Neck: supple, no lymphadenopathy Lungs: clear to ascultation bilaterally, normal work of respiration, no wheezes, rales, ronchi Heart: mildly tachycardic, regular rhythm, no murmurs, gallops, or rubs Abdomen: round, mildly tender, mildly distended, ascites present Extremities: 2+ bilateral pitting edema Neurologic: alert & oriented X3, cranial nerves II-XII intact, strength grossly intact, sensation intact to light touch  Lab Results: Basic Metabolic Panel:  Lab 03/21/12 1610 03/20/12 0457  NA 139 133*  K 4.4 4.0  CL 96 91*  CO2 36* 35*  GLUCOSE 88 95  BUN 19 38*  CREATININE 0.53 0.85  CALCIUM 8.8 9.5  MG -- --  PHOS -- --   Liver Function Tests:  Lab 03/21/12 0415 03/19/12 2158  AST 26 39*  ALT 29 42*  ALKPHOS 69 87  BILITOT 0.4 0.3  PROT 5.9* 7.3  ALBUMIN 2.9* 3.6   CBC:  Lab 03/21/12 0415 03/20/12 0457 03/19/12 2158  WBC 9.0 13.7* --  NEUTROABS -- -- 10.7*  HGB 14.3 15.3* --  HCT 48.0* 48.9*  --  MCV 98.0 95.3 --  PLT 229 257 --   Cardiac Enzymes:  Lab 03/20/12 1651 03/20/12 0845 03/20/12 0015  CKTOTAL 28 23 32  CKMB 2.8 2.8 3.0  CKMBINDEX -- -- --  TROPONINI <0.30 <0.30 <0.30   BNP:  Lab 03/19/12 2159  PROBNP 5727.0*   Coagulation:  Lab 03/21/12 0415 03/19/12 2158  LABPROT 35.4* 42.2*  INR 3.47* 4.34*   Urinalysis:  Lab 03/20/12 0212  COLORURINE YELLOW  LABSPEC 1.021  PHURINE 5.0  GLUCOSEU NEGATIVE  HGBUR NEGATIVE  BILIRUBINUR NEGATIVE  KETONESUR NEGATIVE  PROTEINUR 30*  UROBILINOGEN 0.2  NITRITE POSITIVE*  LEUKOCYTESUR NEGATIVE    Micro Results: Recent Results (from the past 240 hour(s))  MRSA PCR SCREENING     Status: Normal   Collection Time   03/20/12  3:47 AM      Component Value Range Status Comment   MRSA by PCR NEGATIVE  NEGATIVE  Final    Studies/Results: Dg Chest 2 View  03/20/2012  *RADIOLOGY REPORT*  Clinical Data: History of shortness of breath and weakness.  CHEST - 2 VIEW  Comparison: 01/24/2012.  03/06/2012.  03/19/2012.  CT 03/06/2012.  Findings: There is stable mild enlargement of the cardiac silhouette.  There is enlargement of the main pulmonary artery segment.  Hazy opacity in the right base has decreased  since previous study. There is blunting of the right lateral costophrenic angle and both posterior costophrenic angles consistent with small pleural effusions.  Minimal atelectasis is seen.  No consolidation is evident. Bones appear average for age.  IMPRESSION: Stable mild enlargement cardiac silhouette.  Enlargement of main pulmonary artery segment.  Decrease haziness in right base since previous study.  Small bilateral pleural effusions.  Minimal basilar atelectasis.  Original Report Authenticated By: Crawford Givens, M.D.   Dg Chest Port 1 View  03/19/2012  *RADIOLOGY REPORT*  Clinical Data: Shortness of breath.  PORTABLE CHEST - 1 VIEW  Comparison: Chest radiograph and CTA of the chest performed 03/06/2012  Findings: The lungs are  well-aerated.  On the prior study, note was made of mild patchy airspace opacity, which could have reflected mild pneumonia or possibly atelectasis; this is slightly more apparent at the lung bases on the current study.  There is no evidence of pleural effusion or pneumothorax.  The cardiomediastinal silhouette is within normal limits.  No acute osseous abnormalities are seen.  IMPRESSION: On the prior CTA, note was made of mild patchy airspace opacity, which could have reflected mild pneumonia; this is slightly more apparent at the lung bases on the current study, raising concern for persistent mild pneumonia.  Would complete treatment for pneumonia, then consider performing follow-up CT in several weeks as previously suggested, to ensure resolution of vague right upper lobe airspace opacity.  Original Report Authenticated By: Tonia Ghent, M.D.   Medications: I have reviewed the patient's current medications. Scheduled Meds:   . cefTRIAXone (ROCEPHIN)  IV  1 g Intravenous Q24H  . folic acid  1 mg Oral Daily  . furosemide  40 mg Oral BID  . mulitivitamin with minerals  1 tablet Oral Daily  . nicotine  21 mg Transdermal Daily  . pantoprazole  40 mg Oral Q1200  . sodium chloride  3 mL Intravenous Q12H  . thiamine  100 mg Oral Daily   Or  . thiamine  100 mg Intravenous Daily  . Warfarin - Pharmacist Dosing Inpatient   Does not apply q1800  . DISCONTD: fomepizole (ANTIZOL) IV  1,000 mg Intravenous Q12H   Continuous Infusions:  PRN Meds:.acetaminophen, albuterol, LORazepam, LORazepam, ondansetron (ZOFRAN) IV, ondansetron  Assessment/Plan: The patient is a 36 yo woman, history of OHS, presenting with acute respiratory failure.  # Acute Respiratory failure - due to decompensated OSA vs OHS, with non-compliance with home CPAP, and use of sedating substances such as narcotic pain medications and alcohol.  On BiPap yesterday, ARF now resolved.  # Obesity Hypoventilation Syndrome - the patient has a  history of OSA, and is now found to have CO2 retention on ABG, meeting the criteria for OHS.  The patient was tried on CPAP at home after last hospital discharge, but continued to note frequent nocturnal awakenings, daytime sleepiness, and morning headaches.  The patient tolerated BiPap very well last night. -BiPap while inpatient, will try to get BiPap set up as outpatient -CCM consulted, greatly appreciate recs  # Alcohol abuse - history of alcohol abuse, quit for 5 years, but restarted drinking after last hospital discharge.  No serum osm gap. -CIWA protocol  # R subclavian DVT - presented with supratherapeutic INR, now downtrending, no evidence of acute bleeding -warfarin dosing per pharmacy, appreciate assistance -f/u in coumadin clinic  # Acute Kidney Injury - resolved with IV fluids, likely prerenal -resolved  # Prophy - warfarin  # Dispo - transfer to [facility], if  tolerates BiPap with no resp distress, plan for d/c home tomorrow with home BiPap    LOS: 2 days   [name] 03/21/2012, 11:22 AM

## 2012-03-21 NOTE — Progress Notes (Signed)
Pt transferred from 3305 to room 3032.  Made comfortable in bed. Friend and children at bedside.  Pt's denies any needs.  RT called to set-up Bi-Pap at bedside.  Will conitnue to monitor.

## 2012-03-21 NOTE — Progress Notes (Signed)
Request for BiPAP for this pt who is currently awaiting sleep study next week. Pt was d/c last hospitalization with C-PAP. [facility] notified and will review chart for criteria for BiPAP. Johny Shock RN MPH Case Manager [phone]

## 2012-03-21 NOTE — Progress Notes (Addendum)
[facility] PCCM Progress Note  Subjective: Ms. Haslip feels much better this morning, still had some headache with BIPAP but much improved than those she was having with CPAP at home.  O: Filed Vitals:   03/20/12 2300 03/21/12 0423 03/21/12 0500 03/21/12 0800  BP: 131/91 112/72  124/81  Pulse: 96 86  94  Temp: 97.9 F (36.6 C) 98.4 F (36.9 C)  97.6 F (36.4 C)  TempSrc: Oral Oral  Oral  Resp: 22 18  22   Height:      Weight:   90.2 kg (198 lb 13.7 oz)   SpO2: 97% 95%  95%   O2: 3 L [place]  Gen: sitting up in bed, no acute distress HEENT: NCAT, PERRL, EOMi, OP clear, neck supple without masses PULM: few rales in bases CV: RRR, no mgr, no clear JVD (difficult to see) AB: BS+, soft, nontender, no hsm Ext: warm, pitting edema to mid shin bilaterally, no clubbing, no cyanosis Derm: no rash or skin breakdown Neuro: A&Ox4, CN II-XII intact, strength 5/5 in all 4 extremities  A&P: This is a 36 y/o female with a 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  Objectively she has pulmonary hypertension, chronic hypoxemic respiratory failure, COPD and volume overload.  DDx of her pulmonary hypertension is idiopathic primary PAH, vs. PAH related to a connective tissue disease vs. Secondary from chronic hypoxemia from OSA and COPD.  I think that the COPD and OSA are contributing but 3 L of O2 requirement in a 36 year old with minimal emphysema on CT makes me think that there is more to her hypoxemia than just COPD.   Cor pulmonale (03/21/2012)   Assessment: DDx as above, appears volume up again today;      Plan:  -likely needs optimization of her COPD and OSA/Obesity hypoventilation for several months and then have a repeat RHC; if she has persistent PAH at that point she likely needs to see a PH specialist -continue  diuressi -will add connective tissue labs to work up; can have remainder of work up as outpatient  Acute and chronic respiratory failure with hypercapnia (03/21/2012)   Assessment: due to COPD and chronic hypercapnia   Plan:  -agree with Advair, consider Spiriva -will  Try to obtain PFT's from prior hospitalization (referenced in Dr. Dianna Limbo consult note) -will try to get BIPAP as outpatient prior to next week's sleep study; she does not need to have this prior to discharge as it could be arranged after her sleep study  DVT of upper extremity (deep vein thrombosis) (03/06/2012)   Assessment:    Plan:  -warfarin for minimum of 6 months  Yolonda Kida PCCM Pager: [phone] Cell: [phone] If no response, call [phone]

## 2012-03-22 ENCOUNTER — Other Ambulatory Visit (HOSPITAL_COMMUNITY): Payer: Self-pay | Admitting: Radiology

## 2012-03-22 LAB — BLOOD GAS, ARTERIAL
Acid-Base Excess: 11.5 mmol/L — ABNORMAL HIGH (ref 0.0–2.0)
Bicarbonate: 37.7 mEq/L — ABNORMAL HIGH (ref 20.0–24.0)
Drawn by: 275531
O2 Content: 2.8 L/min
O2 Saturation: 92.4 %
Patient temperature: 98.6
TCO2: 40 mmol/L (ref 0–100)
pCO2 arterial: 74.3 mmHg (ref 35.0–45.0)

## 2012-03-22 LAB — CBC
HCT: 47.3 % — ABNORMAL HIGH (ref 36.0–46.0)
Hemoglobin: 14.1 g/dL (ref 12.0–15.0)
MCHC: 29.8 g/dL — ABNORMAL LOW (ref 30.0–36.0)
RDW: 15.3 % (ref 11.5–15.5)
WBC: 8.4 10*3/uL (ref 4.0–10.5)

## 2012-03-22 LAB — BASIC METABOLIC PANEL
Chloride: 96 mEq/L (ref 96–112)
GFR calc Af Amer: >90 mL/min (ref >90)
GFR calc non Af Amer: >90 mL/min (ref >90)
Potassium: 4.3 mEq/L (ref 3.5–5.1)

## 2012-03-22 LAB — PROTIME-INR
INR: 2.23 — ABNORMAL HIGH (ref 0.00–1.49)
Prothrombin Time: 25.1 seconds — ABNORMAL HIGH (ref 11.6–15.2)

## 2012-03-22 LAB — HIV ANTIBODY (ROUTINE TESTING W REFLEX): HIV: NONREACTIVE

## 2012-03-22 LAB — C-REACTIVE PROTEIN: CRP: 1.81 mg/dL — ABNORMAL HIGH (ref <0.60)

## 2012-03-22 LAB — CENTROMERE ANTIBODIES: Centromere Ab Screen: <1 AU/mL (ref <30)

## 2012-03-22 MED ORDER — FLUTICASONE PROPIONATE 50 MCG/ACT NA SUSP
1.0000 | Freq: Every day | NASAL | Status: DC
  Administered 2012-03-22: 1 via NASAL
  Filled 2012-03-22: qty 16

## 2012-03-22 MED ORDER — SALINE SPRAY 0.65 % NA SOLN
2.0000 | NASAL | Status: DC | PRN
  Filled 2012-03-22: qty 44

## 2012-03-22 MED ORDER — FUROSEMIDE 40 MG PO TABS: 40.0000 mg | ORAL_TABLET | Freq: Every day | ORAL | Status: DC

## 2012-03-22 MED ORDER — WARFARIN SODIUM 7.5 MG PO TABS: 3.7500 mg | ORAL_TABLET | Freq: Every day | ORAL | Status: DC

## 2012-03-22 MED ORDER — WARFARIN SODIUM 2.5 MG PO TABS
3.7500 mg | ORAL_TABLET | Freq: Once | ORAL | Status: DC
  Filled 2012-03-22: qty 1

## 2012-03-22 MED ORDER — POLYETHYLENE GLYCOL 3350 17 G PO PACK
17.0000 g | PACK | Freq: Every day | ORAL | Status: DC | PRN
  Filled 2012-03-22: qty 1

## 2012-03-22 NOTE — Progress Notes (Signed)
Spoke with Dr Manson Passey 03/21/2012 re home BiPAP for this pt, notified [facility] who provided C-pap. However on followup yesterday they were unable to find order for BiPAP. Must be ordered as DME and original referral was to Child psychotherapist, who does not arrange DME there was a consult only . This is an order for DME not a consult. Will attempt to locate resting ABG as RN staff informed [facility] that they needs to obtain that information.  Will continue to follow. Johny Shock RN MPH Case Manager [phone]

## 2012-03-22 NOTE — Discharge Summary (Signed)
Internal Medicine Teaching [facility] Discharge Note  Name: Mary [name] MRN: [id number] DOB: 1976-02-14 36 y.o.  Date of Admission: 03/19/2012  9:05 PM Date of Discharge: 03/22/2012 Attending Physician: Burns Spain, MD  Discharge Diagnosis: 1. Acute Respiratory Failure - secondary to alcohol use, opioid use,non-compliance with CPAP, and underlying cardio-pulmonary disease. 2. Obesity Hypoventilation Syndrome - started on BiPap, will continue at home on discharge 3. R Subclavian DVT - on coumadin, presented with INR = 4.34 4. Acute Kidney Injury - prerenal, resolved 5. Alcohol Abuse 6. LE Edema/Ascites - started Lasix 7. Pulmonary Hypertension - components of OHS and likely COPD 8. Right Heart Failure - secondary to pulm HTN  Discharge Medications: Medication List  As of 03/22/2012 11:22 AM   TAKE these medications         albuterol 108 (90 BASE) MCG/ACT inhaler   Commonly known as: PROVENTIL HFA;VENTOLIN HFA   Inhale 2 puffs into the lungs every 6 (six) hours as needed. For shortness of breath      Fluticasone-Salmeterol 250-50 MCG/DOSE Aepb   Commonly known as: ADVAIR   Inhale 1 puff into the lungs every 12 (twelve) hours.      furosemide 40 MG tablet   Commonly known as: LASIX   Take 1 tablet (40 mg total) by mouth daily.      HYDROcodone-acetaminophen 10-325 MG per tablet   Commonly known as: NORCO   Take 0.5 tablets by mouth every 6 (six) hours as needed. For pain      ibuprofen 600 MG tablet   Commonly known as: ADVIL,MOTRIN   Take 600 mg by mouth every 6 (six) hours as needed. For pain         ASK your doctor about these medications         warfarin 7.5 MG tablet   Commonly known as: COUMADIN   Take 1 tablet (7.5 mg total) by mouth daily. TODAY ONLY (April 6th, 2013) take 1 and 1/2 tablets by mouth.            Disposition and follow-up:   Mary [name] was discharged from [facility] in stable and improved condition,  with resolution of acute respiratory failure.  The patient will follow-up with Cardiology, Pulmonology, IM Resident's Clinic, Coumadin Clinic, and the Sleep Center at the appointments listed below.  At her IM Resident's Clinic appointment, we will check a BMET to assess her creatinine, after starting lasix during this admission, and will assess her compliance with home BiPap.  Follow-up Appointments: Discharge Orders    Future Appointments: Provider: Department: Dept Phone: Center:   03/23/2012 2:00 PM Lewayne Bunting, MD [facility] [phone] [facility]   03/27/2012 11:15 AM [facility] Cou[facility] [phone] [facility]   03/28/2012 11:15 AM Na Dierdre Searles, MD Imp[facility]Int Med [facility]tr Res [phone] [facility]   03/29/2012 8:00 PM [facility] 7 [facility] [place] [phone] [facility]   03/30/2012 2:45 PM Kalman Shan, MD [facility] [phone] None      Consultations: Pulmonary Critical Care [facility]  Procedures Performed:  Dg Chest 2 View  03/20/2012  *RADIOLOGY REPORT*  Clinical Data: History of shortness of breath and weakness.  CHEST - 2 VIEW  Comparison: 01/24/2012.  03/06/2012.  03/19/2012.  CT 03/06/2012.  Findings: There is stable mild enlargement of the cardiac silhouette.  There is enlargement of the main pulmonary artery segment.  Hazy opacity in the right base has decreased since previous study. There is blunting of the right lateral  costophrenic angle and both posterior costophrenic angles consistent with small pleural effusions.  Minimal atelectasis is seen.  No consolidation is evident. Bones appear average for age.  IMPRESSION: Stable mild enlargement cardiac silhouette.  Enlargement of main pulmonary artery segment.  Decrease haziness in right base since previous study.  Small bilateral pleural effusions.  Minimal basilar atelectasis.  Original Report Authenticated By: Crawford Givens, M.D.   Dg Chest 2 View  03/06/2012  *RADIOLOGY REPORT*  Clinical Data: Right-sided chest pain.   Swelling.  CHEST - 2 VIEW  Comparison: 01/24/2012.  Findings: Peribronchial thickening may represent changes of chronic bronchitis.  Basilar scarring.  No segmental infiltrate or pneumothorax.  Prominence right hilar region.  This may represent overlapping vascular structures however, adenopathy or mass not excluded.  Heart size top normal.  IMPRESSION: Peribronchial thickening may represent changes of chronic bronchitis.  Prominence right hilar region.  This may represent overlapping vascular structures however, adenopathy or mass not excluded.  Original Report Authenticated By: Fuller Canada, M.D.   Ct Angio Chest W/cm &/or Wo Cm  03/07/2012  **ADDENDUM** CREATED: 03/07/2012 15:35:11  Per discussion with Dr. Bard Herbert, the patient had a right heart cath which  demonstrated elevated right heart pressures which corresponds to the enlarged right atrium and right ventricle and right main pulmonary artery and most likely is responsible for the ascites and third spacing of fluid.  With regard to the possibility pulmonary embolus, the present examination is limited by respiratory motion. Separate from the motion artifact is suggestion of a tiny filling defect within right middle lobe pulmonary branch vessel (series 6 images 105 through 108, series 602 image 31 and is series 603 image 43).  Chronic lung changes may also contribute to the elevated right heart pressures.  **END ADDENDUM** SIGNED BY: Almedia Balls. Constance Goltz, M.D.    03/06/2012  **ADDENDUM** CREATED: 03/06/2012 10:02:30  Contrast utilized was 100 ml of Omnipaque 350.  **END ADDENDUM** SIGNED BY: Almedia Balls. Constance Goltz, M.D.    03/06/2012  *RADIOLOGY REPORT*  Clinical Data: Right upper extremity swelling.  Question abnormal chest x-ray.  CT ANGIOGRAPHY CHEST  Technique:  Multidetector CT imaging of the chest using the standard protocol during bolus administration of intravenous contrast. Multiplanar reconstructed images including MIPs were obtained and reviewed to evaluate  the vascular anatomy.  Contrast:  Comparison: 03/06/2012 chest x-ray.  Findings: Filling defect right internal jugular vein/subclavian vein junction just proximal to the formation of the superior vena cava involving the right subclavian vein.  Marked  right chest wall edema involving the right breast and right axilla.  Right axillary adenopathy.  No pulmonary embolus (probable motion artifact involving the right lower lobe pulmonary vessels).  No aortic dissection.  Mediastinal and right hilar adenopathy.  Scattered pulmonary parenchymal changes without dominant worrisome mass.  Patchy parenchymal changes most notable right upper lobe. This can be reevaluated follow-up after acute episode has cleared.  Small right-sided pleural effusion.  Right heart enlargement. Small superiorly located pericardial effusion.  No bony destructive lesion.  Ascites.  Slight lobularity liver without other findings of cirrhosis.  Mild third spacing of fluid abdominal wall.  IMPRESSION: Thrombosed right subclavian vein.  Marked  right chest wall edema involving the right breast and right axilla.  Right axillary adenopathy.  No pulmonary embolus (probable motion artifact involving the right lower lobe pulmonary vessels).  Mediastinal and right hilar adenopathy.  Scattered pulmonary parenchymal changes without dominant worrisome mass.  Patchy parenchymal changes most notable right upper lobe. This can be  reevaluated follow-up after acute episode has cleared.  Small right-sided pleural effusion.  Right heart enlargement. Small superiorly located pericardial effusion.  Ascites.  Slight lobularity liver without other findings of cirrhosis.  Mild third spacing of fluid abdominal wall.  Original Report Authenticated By: Fuller Canada, M.D.   US Abdomen Complete  02/28/2012  *RADIOLOGY REPORT*  Clinical Data:  Abdominal pain and bloating.  COMPLETE ABDOMINAL ULTRASOUND  Comparison:  None.  Findings:  Gallbladder:  Contracted.  No stones,  wall thickening or sonographic Murphy's sign.  Common bile duct:  Measures 4 mm, within normal limits.  Liver:  Appears mildly increased in echogenicity.  No focal lesions.  IVC:  Visualization is limited by bowel gas.  Pancreas:  No focal abnormality seen.  Spleen:  Measures 11.9 cm, negative.  Right Kidney:  Measures 11.9 cm with normal parenchymal echogenicity.  No hydronephrosis.  Left Kidney:  Measures 11.4 cm with normal parenchymal echogenicity.  No hydronephrosis.  Abdominal aorta:  Mid and distal abdominal aorta are obscured by bowel gas.  Otherwise, no aneurysm.  Additional finding:  Small amount of ascites is seen in the left lower quadrant and pelvis.  No postvoid residual in the bladder.  IMPRESSION:  1.  Small ascites in the left lower quadrant and anatomic pelvis. 2.  No evidence of urinary retention. 3.  Question hepatic steatosis.  Original Report Authenticated By: Reyes Ivan, M.D.   Ct Abdomen Pelvis W Contrast  03/07/2012  *RADIOLOGY REPORT*  Clinical Data: Right-sided edema and adenopathy  CT ABDOMEN AND PELVIS WITH CONTRAST  Technique:  Multidetector CT imaging of the abdomen and pelvis was performed following the standard protocol during bolus administration of intravenous contrast.  Contrast:  80 ml of omni 300  Comparison: None.  Findings:  There are small bilateral pleural effusions, right greater than left.  The mild interlobular septal thickening is noted consistent with mild interstitial edema.  There is diffuse body wall edema identified.  Focal area of low density adjacent to the falciform ligament is identified unlike lie represents focal fatty deposition.  There is mild edema of the gallbladder wall.  Non-specific in a hypoproteinemic and volume overload state.  No biliary dilatation.  The pancreas appears normal.  The spleen is negative.  Both adrenal glands are normal.  The kidneys are both unremarkable.  There are multiple upper abdominal lymph nodes but no evidence for  adenopathy.  There is no pelvic or inguinal adenopathy. Intermediate density fluid within the urinary bladder is present.  This may reflect excreted contrast material from recent chest CT.  There is no pelvic or inguinal adenopathy.  The stomach and the small bowel loops appear normal.  The appendix is identified and is normal.  The colon is negative.  Moderate ascites identified within the abdomen and pelvis.  IMPRESSION:  1.  Findings compatible with fluid overload state with ascites, body wall edema, and pleural effusions. 2.  No mass or adenopathy identified.  Original Report Authenticated By: Rosealee Albee, M.D.   Dg Chest Port 1 View  03/19/2012  *RADIOLOGY REPORT*  Clinical Data: Shortness of breath.  PORTABLE CHEST - 1 VIEW  Comparison: Chest radiograph and CTA of the chest performed 03/06/2012  Findings: The lungs are well-aerated.  On the prior study, note was made of mild patchy airspace opacity, which could have reflected mild pneumonia or possibly atelectasis; this is slightly more apparent at the lung bases on the current study.  There is no evidence of pleural  effusion or pneumothorax.  The cardiomediastinal silhouette is within normal limits.  No acute osseous abnormalities are seen.  IMPRESSION: On the prior CTA, note was made of mild patchy airspace opacity, which could have reflected mild pneumonia; this is slightly more apparent at the lung bases on the current study, raising concern for persistent mild pneumonia.  Would complete treatment for pneumonia, then consider performing follow-up CT in several weeks as previously suggested, to ensure resolution of vague right upper lobe airspace opacity.  Original Report Authenticated By: Tonia Ghent, M.D.    Admission HPI:  Laurali Goddard is a 35 y.o.female with past medical history significant for heavy alcohol abuse, heart failure, recent admission for right upper extremity DVT as well as PE, respiratory failure, and severe obstructive  sleep apnea, who presents with altered mental status since 5 PM on 03/19/2012.  Note patient is entirely somnolent at this time. History was provided by chart view and from her husband and friends.  Mary [name] was admitted to the [facility] for right arm face and breast swelling which was found to be due to acute DVT. She was also found to have pulmonary hypertension and subsequent right heart failure, thought to be secondary to OSA versus OHS versus pulmonary veno- occlusive disease. Patient developed acute on chronic respiratory failure requiring full face BiPAP. Cardiac cath revealed: RA: 23, RV: 59/12/25, PA: 64/45 (mean 53), PCWP: 18, Fick Cardiac Output: 5.0 L/min, Fick Cardiac Index: 2.76 L/min/m2, peripheral aortic saturation: 90% pulmonary artery saturation 67% . Mary [name] was discharged off all diuretics and in good condition.  Today the patient's husband tells me that Mary [name] was feeling good for 2 days after her admission, though she started to feel ill shortly thereafter. She was very weak and tired, With DOE. She could no longer perform her ADLs, such as getting out of the bathtub, unassisted. The patient developed nausea and vomiting approximately 4 days ago. She was discharged on home oxygen 3 L by nasal cannula, though started to develop increasing shortness of breath despite oxygen therapy as of April 12. On April 14 she developed lower extremity edema as well as lower extremity weakness. She had a near fall the morning of admission, where her husband stated she looked very pale. She also was very sleepy appearing. He then tells me that approximately 5 PM this night the patient became increasingly confused, leading to a obtundation when she arrived at the ED. Her husband states is [place] was complaining of small amount of pain in her right hand as well as her bilateral legs, and right chest. The patient currently does not have a working CPAP machine which she requires for her severe obstructive sleep  apnea which has led to right heart failure.  Of note the patient's friend tells me that Mary [name] has recently restarted drinking. Mary [name] started drinking and smoking cigarettes shortly after her discharge from the hospital. Her drinking progressed until she was drinking approximately one fifth of vodka daily. Her friend tells me that yesterday was a particularly heavy drinking day. The patient's husband and friends state that they have been trying to high alcohol from her however she has been sneaking it.  Admission Physical Exam Blood pressure 131/79, pulse 99, temperature 97.7 F (36.5 C), temperature source Axillary, resp. rate 18, last menstrual period 01/28/2012, SpO2 96.00%.  Gen: Morbidly obese female obtunded with no response to verbal or tactile stimulation. Spontaneous unlabored respirations present.  Head: Normocephalic, atraumatic. Eyes: PERRL, EOMI via doll's eye  maneuver. No signs of anemia or jaundice.  Throat: Oropharynx nonerythematous, no exudate appreciated. poor  Neck: Supple with no deformities, masses, or tenderness noted. No carotid Bruits. There is a prominent right external jugular vein that is somewhat tense to the touch, likely representing clot from prior admission  Lungs: Decreased respiratory expansion. Fine inspiratory crackles as well as coarse expiratory wheezes throughout.  Heart: Sinus tachycardia. S1 and S2 normal without murmur or gallop  Abdomen: BS normoactive. Soft, nondistended, non-tender. No masses or organomegaly. Extremities: 3+ pretibial edema to the level of the knee  Neurologic: No focal neurologic deficit, biceps, brachioradialis, tibial reflexes intact and symmetric. Toes downgoing on Babinski  Skin: No visible rashes, scars.  Psych: Unable to assess, friend endorses depression on the part of the patient  Admission Labs Na: 133, K: 5.5, Cl: 92, CO2: 33, BUN: 46, Cr: 1.25, Glucose: 115 WBC: 14.8, Hb: 16.5, HCT: 52.4, Plt: 306 INR:  4.34  Hospital Course by problem list: 1. Acute Respiratory Failure - The patient presented with acute respiratory failure, likely secondary to non-compliance with CPAP for the few days prior to admission, and resuming the use of alcohol in addition to narcotic pain medications, causing sedation.  The patient was placed on BiPap on presentation with an ABG of 7.18/97/344/36.5, and continued to require BiPap for about the first 8-10 hours of her hospital course.  At that time, the patient's mental status began to improve, and BiPap was removed, only used nightly for the remainder of her hospitalization, secondary to Obesity Hypoventilation Syndrome (seen as a pCO2 of 74.3 at rest).  The patient was discharged with no acute respiratory distress, with [facility] replacing her home CPAP with BiPap.  2. Obesity Hypoventilation Syndrome - See above; the patient was noted to have a pCO2 of 74.3 at rest on nasal cannula, as well as obesity, qualifying her for OHS.  The patient will be discharged on BiPap (replacing her current home CPAP), with an official sleep study scheduled for 4/24.  3. R Subclavian DVT - The patient presented 4/1 with an acute R subclavian DVT, see discharge summary from 4/6 for complete details.  The patient was discharged on Warfarin 7.5 mg/day, but presented with an INR = 4.34, though with no acute bleeding.  Her coumadin was held, and downtrended to 2.23 at discharge.  The patient was discharged on Coumadin 3.75 mg/day at Pharmacy's recommendation, and will follow-up in the Coumadin Clinic in 5 days.  4. Acute Kidney Injury - The patient presented with a mildly elevated creatinine of 1.25, likely prerenal, which quickly responded to fluid repletion, and downtrended to 0.5 by discharge.  5. Alcohol Abuse - The patient notes a history of alcohol abuse, but had quit for 5 years prior to her admission two weeks ago.  After her last discharge, her friends and family noted that she  re-started drinking alcohol.  On the day of admission, the patient had had 2 drinks of alcohol, along with one of her husband's vicodin, likely leading to her ARF, in the setting of non-compliance with CPAP and poor pulmonary reserve (given OHS and COPD).  6. Lower Extremity Edema/Ascites - the patient was noted to have LE edema and ascites, likely secondary to right heart failure leading to vascular congestion.  The patient was started on Lasix, to follow-up in the outpatient setting.  7. Pulmonary HTN/Right Heart Failure - see discharge summary from 03/11/12  Time spent on discharge: 45 minutes  Discharge Vitals:  BP 118/81  Pulse 89  Temp(Src) 97.1 F (36.2 C) (Oral)  Resp 20  Ht 5\' 2"  (1.575 m)  Wt 202 lb 6.1 oz (91.8 kg)  BMI 37.02 kg/m2  SpO2 96%  LMP 01/28/2012  Discharge Labs:  Results for orders placed during the hospital encounter of 03/19/12 (from the past 24 hour(s))  RHEUMATOID FACTOR     Status: Normal   Collection Time   03/21/12  5:01 PM      Component Value Range   Rheumatoid Factor <10  <=14 (IU/mL)  SEDIMENTATION RATE     Status: Normal   Collection Time   03/21/12  5:01 PM      Component Value Range   Sed Rate 2  0 - 22 (mm/hr)  C-REACTIVE PROTEIN     Status: Abnormal   Collection Time   03/21/12  5:01 PM      Component Value Range   CRP 1.81 (*) <0.60 (mg/dL)  BASIC METABOLIC PANEL     Status: Abnormal   Collection Time   03/22/12  9:30 AM      Component Value Range   Sodium 141  135 - 145 (mEq/L)   Potassium 4.3  3.5 - 5.1 (mEq/L)   Chloride 96  96 - 112 (mEq/L)   CO2 38 (*) 19 - 32 (mEq/L)   Glucose, Bld 108 (*) 70 - 99 (mg/dL)   BUN 13  6 - 23 (mg/dL)   Creatinine, Ser 2.95  0.50 - 1.10 (mg/dL)   Calcium 8.7  8.4 - 62.1 (mg/dL)   GFR calc non Af Amer >90  >90 (mL/min)   GFR calc Af Amer >90  >90 (mL/min)  CBC     Status: Abnormal   Collection Time   03/22/12  9:30 AM      Component Value Range   WBC 8.4  4.0 - 10.5 (K/uL)   RBC 4.83  3.87 - 5.11  (MIL/uL)   Hemoglobin 14.1  12.0 - 15.0 (g/dL)   HCT 30.8 (*) 65.7 - 46.0 (%)   MCV 97.9  78.0 - 100.0 (fL)   MCH 29.2  26.0 - 34.0 (pg)   MCHC 29.8 (*) 30.0 - 36.0 (g/dL)   RDW 84.6  96.2 - 95.2 (%)   Platelets 220  150 - 400 (K/uL)  PROTIME-INR     Status: Abnormal   Collection Time   03/22/12  9:30 AM      Component Value Range   Prothrombin Time 25.1 (*) 11.6 - 15.2 (seconds)   INR 2.23 (*) 0.00 - 1.49     Signed: [name] 03/22/2012, 11:22 AM

## 2012-03-22 NOTE — Progress Notes (Signed)
Pt placed on Bipap at her request time on or about 0015a.

## 2012-03-22 NOTE — Progress Notes (Signed)
   CARE MANAGEMENT NOTE 03/22/2012  Patient:  Mary [name], Mary [name]   Account Number:  [id number]  Date Initiated:  03/22/2012  Documentation initiated by:  Jerrard Bradburn  Subjective/Objective Assessment:   Request for BiPAP for home use and HHRN.     Action/Plan:   [facility] notified and will change pt current C-PAP for BiPAP. [facility] for [facility] pulmonary assessment.   Anticipated DC Date:  03/22/2012   Anticipated DC Plan:  HOME W HOME HEALTH SERVICES         [facility] Choice  HOME HEALTH  DURABLE MEDICAL EQUIPMENT   Choice offered to / List presented to:  C-1 Patient   DME arranged  BIPAP      DME agency  [facility]     [facility] arranged  HH-1 RN      [facility] agency  [facility]   Status of service:  Completed, signed off Medicare Important Message given?   (If response is "NO", the following Medicare IM given date fields will be blank) Date Medicare IM given:   Date Additional Medicare IM given:    Discharge Disposition:  HOME W HOME HEALTH SERVICES  Per UR Regulation:    If discussed at Long Length of Stay Meetings, dates discussed:    Comments:

## 2012-03-22 NOTE — Progress Notes (Signed)
Subjective: No acute events overnight.  Patient notes not sleeping well overnight despite BiPap, and awakening with headache.  Patient wants to go home.  Objective: Vital signs in last 24 hours: Filed Vitals:   03/21/12 1800 03/22/12 0000 03/22/12 0335 03/22/12 0600  BP: 117/81 127/75  136/86  Pulse: 112 98 91 80  Temp: 97.9 F (36.6 C) 97.9 F (36.6 C)  97.2 F (36.2 C)  TempSrc: Oral Oral  Oral  Resp: 18 19 20 20   Height:      Weight:    202 lb 6.1 oz (91.8 kg)  SpO2: 89% 94%  93%   Weight change: 3 lb 8.4 oz (1.6 kg)  Intake/Output Summary (Last 24 hours) at 03/22/12 0755 Last data filed at 03/21/12 1700  Gross per 24 hour  Intake    480 ml  Output    850 ml  Net   -370 ml   Physical Exam: General: alert, cooperative, tired HEENT: pupils equal round and reactive to light, vision grossly intact, oropharynx clear and non-erythematous  Neck: supple, no lymphadenopathy Lungs: clear to ascultation bilaterally, normal work of respiration, no wheezes, rales, ronchi Heart: mildly tachycardic, regular rhythm, no murmurs, gallops, or rubs Abdomen: round, mildly tender, mildly distended, ascites present Extremities: 2+ bilateral pitting edema Neurologic: alert & oriented X3, cranial nerves II-XII intact, strength grossly intact, sensation intact to light touch  Lab Results: Basic Metabolic Panel:  Lab 03/21/12 1610 03/20/12 0457  NA 139 133*  K 4.4 4.0  CL 96 91*  CO2 36* 35*  GLUCOSE 88 95  BUN 19 38*  CREATININE 0.53 0.85  CALCIUM 8.8 9.5  MG -- --  PHOS -- --   Liver Function Tests:  Lab 03/21/12 0415 03/19/12 2158  AST 26 39*  ALT 29 42*  ALKPHOS 69 87  BILITOT 0.4 0.3  PROT 5.9* 7.3  ALBUMIN 2.9* 3.6   CBC:  Lab 03/21/12 0415 03/20/12 0457 03/19/12 2158  WBC 9.0 13.7* --  NEUTROABS -- -- 10.7*  HGB 14.3 15.3* --  HCT 48.0* 48.9* --  MCV 98.0 95.3 --  PLT 229 257 --   Cardiac Enzymes:  Lab 03/20/12 1651 03/20/12 0845 03/20/12 0015  CKTOTAL 28 23  32  CKMB 2.8 2.8 3.0  CKMBINDEX -- -- --  TROPONINI <0.30 <0.30 <0.30   BNP:  Lab 03/19/12 2159  PROBNP 5727.0*   Coagulation:  Lab 03/21/12 0415 03/19/12 2158  LABPROT 35.4* 42.2*  INR 3.47* 4.34*   Urinalysis:  Lab 03/20/12 0212  COLORURINE YELLOW  LABSPEC 1.021  PHURINE 5.0  GLUCOSEU NEGATIVE  HGBUR NEGATIVE  BILIRUBINUR NEGATIVE  KETONESUR NEGATIVE  PROTEINUR 30*  UROBILINOGEN 0.2  NITRITE POSITIVE*  LEUKOCYTESUR NEGATIVE    Micro Results: Recent Results (from the past 240 hour(s))  MRSA PCR SCREENING     Status: Normal   Collection Time   03/20/12  3:47 AM      Component Value Range Status Comment   MRSA by PCR NEGATIVE  NEGATIVE  Final    Studies/Results: Dg Chest 2 View  03/20/2012  *RADIOLOGY REPORT*  Clinical Data: History of shortness of breath and weakness.  CHEST - 2 VIEW  Comparison: 01/24/2012.  03/06/2012.  03/19/2012.  CT 03/06/2012.  Findings: There is stable mild enlargement of the cardiac silhouette.  There is enlargement of the main pulmonary artery segment.  Hazy opacity in the right base has decreased since previous study. There is blunting of the right lateral costophrenic angle and both posterior  costophrenic angles consistent with small pleural effusions.  Minimal atelectasis is seen.  No consolidation is evident. Bones appear average for age.  IMPRESSION: Stable mild enlargement cardiac silhouette.  Enlargement of main pulmonary artery segment.  Decrease haziness in right base since previous study.  Small bilateral pleural effusions.  Minimal basilar atelectasis.  Original Report Authenticated By: Crawford Givens, M.D.   Medications: I have reviewed the patient's current medications. Scheduled Meds:    . Fluticasone-Salmeterol  1 puff Inhalation BID  . folic acid  1 mg Oral Daily  . furosemide  40 mg Oral BID  . mulitivitamin with minerals  1 tablet Oral Daily  . nicotine  21 mg Transdermal Daily  . pantoprazole  40 mg Oral Q1200  . sodium  chloride  3 mL Intravenous Q12H  . thiamine  100 mg Oral Daily   Or  . thiamine  100 mg Intravenous Daily  . Warfarin - Pharmacist Dosing Inpatient   Does not apply q1800  . DISCONTD: cefTRIAXone (ROCEPHIN)  IV  1 g Intravenous Q24H   Continuous Infusions:  PRN Meds:.acetaminophen, albuterol, ibuprofen, LORazepam, LORazepam, ondansetron (ZOFRAN) IV, ondansetron  Assessment/Plan: The patient is a 36 yo woman, history of OHS, presenting with acute respiratory failure.  # Acute Respiratory failure - due to decompensated OSA vs OHS, with non-compliance with home CPAP, and use of sedating substances such as narcotic pain medications and alcohol. ARF now resolved.  # Obesity Hypoventilation Syndrome - the patient has a history of OSA, and is now found to have CO2 retention on ABG, meeting the criteria for OHS.  The patient was tried on CPAP at home after last hospital discharge, but continued to note frequent nocturnal awakenings, daytime sleepiness, and morning headaches. -BiPap while inpatient, will try to get BiPap set up as outpatient -CCM consulted, greatly appreciate recs  # Alcohol abuse - history of alcohol abuse, quit for 5 years, but restarted drinking after last hospital discharge.  No serum osm gap. -CIWA protocol  # R subclavian DVT - presented with supratherapeutic INR, now downtrending, no evidence of acute bleeding -warfarin dosing per pharmacy, appreciate assistance -f/u in coumadin clinic  # Acute Kidney Injury - resolved with IV fluids, likely prerenal -resolved  # Prophy - warfarin  # Dispo - plan for d/c today with home BiPap    LOS: 3 days   [name] 03/22/2012, 7:55 AM

## 2012-03-22 NOTE — Progress Notes (Signed)
ANTICOAGULATION CONSULT NOTE - Follow Up Consult  Pharmacy Consult for Coumadin Indication: recent DVT/PE  No Known Allergies  Vital Signs: Temp: 97.1 F (36.2 C) (04/17 0946) Temp src: Oral (04/17 0946) BP: 118/81 mmHg (04/17 0946) Pulse Rate: 89  (04/17 0946)  Labs:  Basename 03/22/12 0930 03/21/12 0415 03/20/12 1651 03/20/12 0845 03/20/12 0457 03/20/12 0015 03/19/12 2158  HGB 14.1 14.3 -- -- -- -- --  HCT 47.3* 48.0* -- -- 48.9* -- --  PLT 220 229 -- -- 257 -- --  APTT -- -- -- -- -- -- --  LABPROT 25.1* 35.4* -- -- -- -- 42.2*  INR 2.23* 3.47* -- -- -- -- 4.34*  HEPARINUNFRC -- -- -- -- -- -- --  CREATININE 0.50 0.53 -- -- 0.85 -- --  CKTOTAL -- -- 28 23 -- 32 --  CKMB -- -- 2.8 2.8 -- 3.0 --  TROPONINI -- -- <0.30 <0.30 -- <0.30 --   Estimated Creatinine Clearance: 104.5 ml/min (by C-G formula based on Cr of 0.5).  Assessment: 35yof on coumadin pta for recent DVT/PE (03/06/12) admitted with a supratherapeutic INR in setting of possible ethylene glycol intoxication.    Anticoagulation: recent DVT (R-subclavian on 4/1)/PE on chronic warfarin 7.5mg  daily per home med list?? (per Dr. Saralyn Pilar Coumadin visit note on 03/13/12 patient was on 7.5mg  Wed-Thurs-Sat and 3.75mg  all other days. INR elevated on admit likely due to HF exacerbation, 4.34 >> 3.47 >> 2.23 today after holding warfarin x2 days. Last dose of warfarin was 4/14 per notes. Plan to d/c home today. Recommend warfarin 3.75mg  po daily for discharge (12/ of a 7.5mg  tablet) until follows up with Coumadin clinic in next 2-3 days. No bleeding noted. CBC wnl.     Goal of Therapy:  INR 2-3   Plan:  1) Coumadin 3.75mg  (1/2 of a 7.5mg  tablet) po x1 tonight. 2) Follow up INR in AM 3) If goes home today, recommend 3.75mg  po daily with follow-up at Coumadin clinic in next 2-3 days to assess if can go back on home dose of 7.5mg  po W-Th-Sat and 3.75mg  all other days per Coumadin clinic note on 03/13/12.   Link Snuffer, PharmD,  BCPS Clinical Pharmacist [phone] 03/22/2012,11:04 AM

## 2012-03-23 ENCOUNTER — Ambulatory Visit (INDEPENDENT_AMBULATORY_CARE_PROVIDER_SITE_OTHER): Payer: 59 | Admitting: Cardiology

## 2012-03-23 ENCOUNTER — Encounter: Payer: Self-pay | Admitting: Cardiology

## 2012-03-23 VITALS — BP 142/80 | HR 74 | Resp 18 | Ht 62.0 in | Wt 201.0 lb

## 2012-03-23 DIAGNOSIS — I2789 Other specified pulmonary heart diseases: Secondary | ICD-10-CM

## 2012-03-23 DIAGNOSIS — I50812 Chronic right heart failure: Secondary | ICD-10-CM | POA: Insufficient documentation

## 2012-03-23 DIAGNOSIS — I82629 Acute embolism and thrombosis of deep veins of unspecified upper extremity: Secondary | ICD-10-CM

## 2012-03-23 DIAGNOSIS — F17200 Nicotine dependence, unspecified, uncomplicated: Secondary | ICD-10-CM

## 2012-03-23 DIAGNOSIS — I272 Pulmonary hypertension, unspecified: Secondary | ICD-10-CM | POA: Insufficient documentation

## 2012-03-23 DIAGNOSIS — F1010 Alcohol abuse, uncomplicated: Secondary | ICD-10-CM

## 2012-03-23 DIAGNOSIS — I509 Heart failure, unspecified: Secondary | ICD-10-CM

## 2012-03-23 DIAGNOSIS — Z720 Tobacco use: Secondary | ICD-10-CM

## 2012-03-23 DIAGNOSIS — R0600 Dyspnea, unspecified: Secondary | ICD-10-CM

## 2012-03-23 DIAGNOSIS — J449 Chronic obstructive pulmonary disease, unspecified: Secondary | ICD-10-CM

## 2012-03-23 HISTORY — DX: Chronic right heart failure: I50.812

## 2012-03-23 MED ORDER — FUROSEMIDE 40 MG PO TABS: 40.0000 mg | ORAL_TABLET | Freq: Two times a day (BID) | ORAL | Status: DC

## 2012-03-23 NOTE — Assessment & Plan Note (Signed)
She will most likely need consideration of vasodilator therapy in the future.

## 2012-03-23 NOTE — Assessment & Plan Note (Signed)
Patient counseled on discontinuing. 

## 2012-03-23 NOTE — Assessment & Plan Note (Addendum)
The patient has severe right-side CHF. This is felt secondary to COPD, obstructive sleep apnea with possible contribution from pulmonary embolus. She is volume overloaded on examination. She has ascites and 2+ edema. I will ask her to take an additional 80 mg of Lasix today, 80 mg twice a day tomorrow and then increase to 40 mg twice a day afterwards. Check potassium and renal function on April 22. Continue Coumadin. She may need vasodilator therapy in the future. I will have her to CHF clinic for evaluation by Dr. Gala Romney. She will followup with pulmonary as scheduled. I have stressed compliance with home oxygen and CPAP. I have stressed the importance of tobacco. Continue bronchodilators. Note echocardiogram did not show congenital heart disease by report. There was evidence of right atrial and right ventricular enlargement and reduced RV function.

## 2012-03-23 NOTE — Patient Instructions (Signed)
Your physician recommends that you schedule a follow-up appointment WITH DR BENSIMHON IN THE [facility] FAILURE [facility]=NEEDS TO BE SEEN WITHIN 2 WEEKS  INCREASE FUROSEMIDE TAKE 80 MG TONIGHT=80 MG TOMORROW MORNING (FRIDAY) AND 80 MG Friday EVENING=THEN TAKE 40 MG TWICE DAILY  Your physician recommends that you return for lab work in: Monday 03-27-12

## 2012-03-23 NOTE — Progress Notes (Signed)
HPI: Pleasant female I initially saw in March of 2013 for evaluation of dyspnea and edema. The patient states she had heart and lung surgery as a child. This was done in [place]. None of those records are available. She does not think she had congenital heart disease. Note she does have a history of COPD. She also has a history of alcoholism. Patient was admitted in April with right sided facial, right upper extremity and breast swelling. Upper extremity venous Dopplers showed acute right upper extremity DVT. Chest CT showed a thrombosed right subclavian vein. There was no pulmonary embolus. Ascites was noted. Pulmonary reviewed the CT and felt there was a possible small pulmonary embolus in the right middle lobe. Abdominal CT showed ascites, body wall edema and pleural effusions. An echocardiogram in April of 2013 was technically difficult. LV function was normal. There was right atrial and right ventricular enlargement with reduced right ventricular function. Patient had a right-sided cardiac catheterization which revealed right atrial pressure 23, pulmonary artery pressure of 64/45 and pulmonary capillary wedge pressure of 18. Cardiac output was 5.0. Findings were felt secondary to component of severe obstructive sleep apnea, COPD and obesity hypoventilation syndrome. There may also have been a contribution from pulmonary venoocclusive disease. PFTs apparently demonstrated severe obstruction with gas trapping and some degree of reversibility. Note HIV was nonreactive, ANA was negative, rheumatoid factor was less than 10. Anti-scleroderma antibody and centromere antibodies were negative. Sedimentation rate was 2. Patient DCed with coumadin, home O2 and CPAP. However she was readmitted with respiratory failure. Apparently she had resumed smoking and drinking heavily. It was felt her respiratory failure was from noncompliance with CPAP in addition to alcohol and narcotic pain medications. She ultimately  improved; since then, she does have some dyspnea on exertion. There is no orthopnea or PND but she has had increasing pedal edema. There is no chest pain. She has not had syncope.   Current Outpatient Prescriptions  Medication Sig Dispense Refill  . albuterol (PROVENTIL HFA;VENTOLIN HFA) 108 (90 BASE) MCG/ACT inhaler Inhale 2 puffs into the lungs every 6 (six) hours as needed. For shortness of breath      . Fluticasone-Salmeterol (ADVAIR) 250-50 MCG/DOSE AEPB Inhale 1 puff into the lungs every 12 (twelve) hours.      . furosemide (LASIX) 40 MG tablet Take 1 tablet (40 mg total) by mouth daily.  30 tablet  1  . ibuprofen (ADVIL,MOTRIN) 600 MG tablet Take 600 mg by mouth every 6 (six) hours as needed. For pain      . warfarin (COUMADIN) 7.5 MG tablet Take 0.5 tablets (3.75 mg total) by mouth daily.  30 tablet  0  . HYDROcodone-acetaminophen (NORCO) 10-325 MG per tablet Take 0.5 tablets by mouth every 6 (six) hours as needed. For pain       No current facility-administered medications for this visit.   Facility-Administered Medications Ordered in Other Visits  Medication Dose Route Frequency Provider Last Rate Last Dose  . DISCONTD: acetaminophen (TYLENOL) tablet 650 mg  650 mg Oral Q6H PRN Duwaine Maxin, MD   650 mg at 03/22/12 0517  . DISCONTD: albuterol (PROVENTIL) (5 MG/ML) 0.5% nebulizer solution 2.5 mg  2.5 mg Nebulization Q2H PRN Almyra Deforest, MD      . DISCONTD: fluticasone (FLONASE) 50 MCG/ACT nasal spray 1 spray  1 spray Each Nare Daily Leodis Sias, MD   1 spray at 03/22/12 1044  . DISCONTD: Fluticasone-Salmeterol (ADVAIR) 250-50 MCG/DOSE inhaler 1 puff  1 puff  Inhalation BID Lupita Leash, MD   1 puff at 03/22/12 0920  . DISCONTD: folic acid (FOLVITE) tablet 1 mg  1 mg Oral Daily Almyra Deforest, MD   1 mg at 03/22/12 0927  . DISCONTD: furosemide (LASIX) tablet 40 mg  40 mg Oral BID Linward Headland, MD   40 mg at 03/22/12 0927  . DISCONTD: ibuprofen (ADVIL,MOTRIN) tablet  400 mg  400 mg Oral Q6H PRN Duwaine Maxin, MD   400 mg at 03/22/12 4098  . DISCONTD: LORazepam (ATIVAN) injection 1 mg  1 mg Intravenous Q6H PRN Linward Headland, MD      . DISCONTD: LORazepam (ATIVAN) tablet 1 mg  1 mg Oral Q6H PRN Linward Headland, MD      . DISCONTD: mulitivitamin with minerals tablet 1 tablet  1 tablet Oral Daily Almyra Deforest, MD   1 tablet at 03/22/12 0927  . DISCONTD: nicotine (NICODERM CQ - dosed in mg/24 hours) patch 21 mg  21 mg Transdermal Daily Duwaine Maxin, MD   21 mg at 03/22/12 0932  . DISCONTD: ondansetron (ZOFRAN) injection 4 mg  4 mg Intravenous Q6H PRN Almyra Deforest, MD      . DISCONTD: ondansetron (ZOFRAN) tablet 4 mg  4 mg Oral Q6H PRN Almyra Deforest, MD      . DISCONTD: pantoprazole (PROTONIX) EC tablet 40 mg  40 mg Oral Q1200 Linward Headland, MD   40 mg at 03/21/12 1220  . DISCONTD: polyethylene glycol (MIRALAX / GLYCOLAX) packet 17 g  17 g Oral Daily PRN Leodis Sias, MD      . DISCONTD: sodium chloride (OCEAN) 0.65 % nasal spray 2 spray  2 spray Each Nare PRN Leodis Sias, MD      . DISCONTD: sodium chloride 0.9 % injection 3 mL  3 mL Intravenous Q12H Almyra Deforest, MD   3 mL at 03/22/12 0933  . DISCONTD: thiamine (B-1) injection 100 mg  100 mg Intravenous Daily Almyra Deforest, MD      . DISCONTD: thiamine (VITAMIN B-1) tablet 100 mg  100 mg Oral Daily Almyra Deforest, MD   100 mg at 03/22/12 0927  . DISCONTD: warfarin (COUMADIN) tablet 3.75 mg  3.75 mg Oral ONCE-1800 Fayne Norrie, [place]      . DISCONTD: Warfarin - Pharmacist Dosing Inpatient   Does not apply q1800 Elson Clan, [facility]         Past Medical History  Diagnosis Date  . COPD (chronic obstructive pulmonary disease)     no documented PFTs, but patient said it was confirmed  . Chronic asthma   . History of alcoholism     sober last 5 years  . Tobacco abuse   . Irregular menses   . DVT of upper extremity (deep vein thrombosis) April 2013    right  subclavian  . Pulmonary embolus April 2013  . Pulmonary HTN April 2013    Cardiac cath on 03/06/12  . OSA (obstructive sleep apnea)   . Cirrhosis     Past Surgical History  Procedure Date  . Cardiac surgery   . Lung surgery     History   Social History  . Marital Status: Married    Spouse Name: N/A    Number of Children: N/A  . Years of Education: college   Occupational History  .      [employer]   Social History Main Topics  . Smoking status: Current Everyday Smoker -- 1.0 packs/day for 25 years  Types: Cigarettes  . Smokeless tobacco: Not on file   Comment: quit smoking 1 week ago  . Alcohol Use: Yes     One drink per week  . Drug Use: No  . Sexually Active: Yes -- Female partner(s)    Birth Control/ Protection: None   Other Topics Concern  . 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 Remaining systems are negative.  Physical Exam: Well-developed well-nourished in no acute distress.  Skin is warm and dry.  HEENT is significant for strabismus Neck is supple.  Chest is clear to auscultation with normal expansion.  Cardiovascular exam is regular rate and rhythm.  Abdominal exam shows ascites, no masses palpated Extremities show 2+ edema. neuro grossly intact

## 2012-03-23 NOTE — Assessment & Plan Note (Signed)
Management per pulmonary. 

## 2012-03-23 NOTE — Assessment & Plan Note (Signed)
Patient counseled on avoiding. 

## 2012-03-23 NOTE — Assessment & Plan Note (Signed)
Continue Coumadin. INR followed at [facility].

## 2012-03-24 ENCOUNTER — Other Ambulatory Visit: Payer: Self-pay | Admitting: *Deleted

## 2012-03-24 NOTE — Telephone Encounter (Signed)
Once again, is she supposed to be on opiates?

## 2012-03-24 NOTE — Telephone Encounter (Signed)
Pt states needs refill on pain med. Has appt with Dr Alexandria Lodge  03/27/12 and new  HFU appt with Dr Dierdre Searles 03/28/12 11:15AM. Pt states PCP will be [facility] -  Has not seen Dr Milus Glazier .

## 2012-03-24 NOTE — Telephone Encounter (Signed)
Try to reach Dr. Manson Passey about this.  Is she supposed to be on opiates?

## 2012-03-25 ENCOUNTER — Encounter (HOSPITAL_COMMUNITY): Payer: Self-pay

## 2012-03-25 ENCOUNTER — Emergency Department (HOSPITAL_COMMUNITY): Payer: 59

## 2012-03-25 ENCOUNTER — Inpatient Hospital Stay (HOSPITAL_COMMUNITY)
Admission: EM | Admit: 2012-03-25 | Discharge: 2012-03-29 | DRG: 292 | Disposition: A | Discharge status: Home / Self Care | Payer: 59 | Attending: Internal Medicine | Admitting: Internal Medicine

## 2012-03-25 DIAGNOSIS — I808 Phlebitis and thrombophlebitis of other sites: Secondary | ICD-10-CM | POA: Diagnosis present

## 2012-03-25 DIAGNOSIS — I509 Heart failure, unspecified: Secondary | ICD-10-CM

## 2012-03-25 DIAGNOSIS — Z7901 Long term (current) use of anticoagulants: Secondary | ICD-10-CM

## 2012-03-25 DIAGNOSIS — E662 Morbid (severe) obesity with alveolar hypoventilation: Secondary | ICD-10-CM | POA: Diagnosis present

## 2012-03-25 DIAGNOSIS — R140 Abdominal distension (gaseous): Secondary | ICD-10-CM | POA: Diagnosis present

## 2012-03-25 DIAGNOSIS — I5023 Acute on chronic systolic (congestive) heart failure: Principal | ICD-10-CM | POA: Diagnosis present

## 2012-03-25 DIAGNOSIS — L02419 Cutaneous abscess of limb, unspecified: Secondary | ICD-10-CM | POA: Diagnosis present

## 2012-03-25 DIAGNOSIS — I2782 Chronic pulmonary embolism: Secondary | ICD-10-CM | POA: Diagnosis present

## 2012-03-25 DIAGNOSIS — R071 Chest pain on breathing: Secondary | ICD-10-CM | POA: Diagnosis present

## 2012-03-25 DIAGNOSIS — I82509 Chronic embolism and thrombosis of unspecified deep veins of unspecified lower extremity: Secondary | ICD-10-CM | POA: Diagnosis present

## 2012-03-25 DIAGNOSIS — I2789 Other specified pulmonary heart diseases: Secondary | ICD-10-CM | POA: Diagnosis present

## 2012-03-25 DIAGNOSIS — R600 Localized edema: Secondary | ICD-10-CM | POA: Diagnosis present

## 2012-03-25 DIAGNOSIS — I82629 Acute embolism and thrombosis of deep veins of unspecified upper extremity: Secondary | ICD-10-CM | POA: Diagnosis present

## 2012-03-25 DIAGNOSIS — Z87891 Personal history of nicotine dependence: Secondary | ICD-10-CM

## 2012-03-25 DIAGNOSIS — J9612 Chronic respiratory failure with hypercapnia: Secondary | ICD-10-CM | POA: Diagnosis present

## 2012-03-25 DIAGNOSIS — J4489 Other specified chronic obstructive pulmonary disease: Secondary | ICD-10-CM | POA: Diagnosis present

## 2012-03-25 DIAGNOSIS — Z8249 Family history of ischemic heart disease and other diseases of the circulatory system: Secondary | ICD-10-CM

## 2012-03-25 DIAGNOSIS — J449 Chronic obstructive pulmonary disease, unspecified: Secondary | ICD-10-CM | POA: Diagnosis present

## 2012-03-25 DIAGNOSIS — G4733 Obstructive sleep apnea (adult) (pediatric): Secondary | ICD-10-CM | POA: Diagnosis present

## 2012-03-25 DIAGNOSIS — I50812 Chronic right heart failure: Secondary | ICD-10-CM | POA: Diagnosis present

## 2012-03-25 LAB — COMPREHENSIVE METABOLIC PANEL
Albumin: 3.4 g/dL — ABNORMAL LOW (ref 3.5–5.2)
BUN: 8 mg/dL (ref 6–23)
Calcium: 9.3 mg/dL (ref 8.4–10.5)
Chloride: 90 mEq/L — ABNORMAL LOW (ref 96–112)
Creatinine, Ser: 0.55 mg/dL (ref 0.50–1.10)
GFR calc non Af Amer: >90 mL/min (ref >90)
Total Bilirubin: 0.7 mg/dL (ref 0.3–1.2)

## 2012-03-25 LAB — URINALYSIS, ROUTINE W REFLEX MICROSCOPIC
Glucose, UA: NEGATIVE mg/dL
Leukocytes, UA: NEGATIVE
Protein, ur: 30 mg/dL — AB
Specific Gravity, Urine: 1.015 (ref 1.005–1.030)
Urobilinogen, UA: 0.2 mg/dL (ref 0.0–1.0)

## 2012-03-25 LAB — CBC
HCT: 48.4 % — ABNORMAL HIGH (ref 36.0–46.0)
Hemoglobin: 15.1 g/dL — ABNORMAL HIGH (ref 12.0–15.0)
MCH: 29.7 pg (ref 26.0–34.0)
MCHC: 31.2 g/dL (ref 30.0–36.0)
MCV: 95.1 fL (ref 78.0–100.0)
RDW: 15 % (ref 11.5–15.5)

## 2012-03-25 LAB — PROTIME-INR
INR: 1.24 (ref 0.00–1.49)
Prothrombin Time: 15.9 seconds — ABNORMAL HIGH (ref 11.6–15.2)

## 2012-03-25 LAB — DIFFERENTIAL
Basophils Relative: 0 % (ref 0–1)
Eosinophils Relative: 3 % (ref 0–5)
Monocytes Absolute: 1.1 10*3/uL — ABNORMAL HIGH (ref 0.1–1.0)
Monocytes Relative: 11 % (ref 3–12)
Neutro Abs: 6.6 10*3/uL (ref 1.7–7.7)

## 2012-03-25 MED ORDER — NITROGLYCERIN 2 % TD OINT
1.0000 [in_us] | TOPICAL_OINTMENT | Freq: Once | TRANSDERMAL | Status: AC
  Administered 2012-03-25: 1 [in_us] via TOPICAL
  Filled 2012-03-25: qty 1

## 2012-03-25 MED ORDER — FUROSEMIDE 10 MG/ML IJ SOLN
80.0000 mg | Freq: Once | INTRAMUSCULAR | Status: AC
  Administered 2012-03-25: 80 mg via INTRAVENOUS
  Filled 2012-03-25: qty 8

## 2012-03-25 MED ORDER — SODIUM CHLORIDE 0.9 % IV SOLN
INTRAVENOUS | Status: DC
  Administered 2012-03-25: 19:00:00 via INTRAVENOUS

## 2012-03-25 MED ORDER — SODIUM CHLORIDE 0.9 % IV SOLN: INTRAVENOUS | Status: DC

## 2012-03-25 NOTE — ED Notes (Signed)
Pt was brought in by ambulance with c/o increased in swelling to BLE  And abdomen despite her PMD ioncreasing her Lasix dose. Pt also c/o shortness of breath on exertion.

## 2012-03-25 NOTE — ED Notes (Signed)
MD at bedside. 

## 2012-03-25 NOTE — ED Notes (Signed)
MDs at bedside

## 2012-03-25 NOTE — ED Notes (Signed)
Called and gave report to [place].

## 2012-03-25 NOTE — ED Provider Notes (Signed)
History     CSN: [id number]  Arrival date & time 03/25/12  1815   First MD Initiated Contact with Patient 03/25/12 1837      Chief Complaint  Patient presents with  . Shortness of Breath  . Leg Swelling    (Consider location/radiation/quality/duration/timing/severity/associated sxs/prior treatment) HPI Comments: Patient is a 36 year old woman who has COPD, a DVT of the right upper extremity, prior history of pulmonary embolism and pulmonary hypertension, as well as cirrhosis of the liver. She has been hospitalized recently, having been discharged from the hospital on 03/22/2012. She had significant peripheral edema and had recently had her dose of Lasix increased to 80 mg per day. Despite the Lasix, she continues to be short of breath and have prominent pitting edema. The home health nurse visited her today, called her internists at the outpatient clinic, who advised evaluation in the ED.  Patient is a 36 y.o. female presenting with shortness of breath. The history is provided by the patient and medical records. No language interpreter was used.  Shortness of Breath  The current episode started more than 2 weeks ago. The problem occurs continuously. The problem has been gradually worsening. The problem is severe. The symptoms are relieved by nothing. The symptoms are aggravated by nothing (She says she has recently stopped smoking.). Associated symptoms include orthopnea and shortness of breath. Pertinent negatives include no fever. Associated symptoms comments: Peripheral edema.. She was not exposed to toxic fumes. She has had prior hospitalizations. She has had prior ICU admissions. She has had no prior intubations. Past medical history comments: COPD.Marland Kitchen Recently, medical care has been given by a specialist and at this facility. Services received include medications given.    Past Medical History  Diagnosis Date  . COPD (chronic obstructive pulmonary disease)     no documented PFTs, but  patient said it was confirmed  . Chronic asthma   . History of alcoholism     sober last 5 years  . Tobacco abuse   . Irregular menses   . DVT of upper extremity (deep vein thrombosis) April 2013    right subclavian  . Pulmonary embolus April 2013  . Pulmonary HTN April 2013    Cardiac cath on 03/06/12  . OSA (obstructive sleep apnea)   . Cirrhosis     Past Surgical History  Procedure Date  . Cardiac surgery   . Lung surgery     Family History  Problem Relation Age of Onset  . Hypertension Father   . Heart disease Father     CHF  . Alcohol abuse Father     History  Substance Use Topics  . Smoking status: Former Smoker -- 1.0 packs/day for 25 years    Types: Cigarettes  . Smokeless tobacco: Not on file   Comment: quit smoking 1 week ago  . Alcohol Use: No     One drink per week    OB History    Grav Para Term Preterm Abortions TAB SAB Ect Mult Living                  Review of Systems  Constitutional: Negative.  Negative for fever and chills.  HENT: Positive for facial swelling.   Respiratory: Positive for shortness of breath.   Cardiovascular: Positive for orthopnea and leg swelling.  Gastrointestinal: Negative.   Genitourinary: Negative.   Musculoskeletal: Negative.   Skin: Negative.   Neurological: Negative.   Psychiatric/Behavioral: Negative.     Allergies  Review of patient's  allergies indicates no known allergies.  Home Medications   Current Outpatient Rx  Name Route Sig Dispense Refill  . ALBUTEROL SULFATE HFA 108 (90 BASE) MCG/ACT IN AERS Inhalation Inhale 2 puffs into the lungs every 6 (six) hours as needed. For shortness of breath    . FLUTICASONE-SALMETEROL 250-50 MCG/DOSE IN AEPB Inhalation Inhale 1 puff into the lungs every 12 (twelve) hours.    . FUROSEMIDE 40 MG PO TABS Oral Take 1 tablet (40 mg total) by mouth 2 (two) times daily. 60 tablet 12  . HYDROCODONE-ACETAMINOPHEN 10-325 MG PO TABS Oral Take 0.5 tablets by mouth every 6 (six)  hours as needed. For pain    . IBUPROFEN 600 MG PO TABS Oral Take 600 mg by mouth every 6 (six) hours as needed. For pain    . WARFARIN SODIUM 7.5 MG PO TABS Oral Take 0.5 tablets (3.75 mg total) by mouth daily. 30 tablet 0    BP 123/85  Pulse 103  Temp(Src) 97.9 F (36.6 C) (Oral)  Resp 26  Ht 5\' 2"  (1.575 m)  Wt 201 lb (91.173 kg)  BMI 36.76 kg/m2  SpO2 97%  LMP 01/28/2012  Physical Exam  Nursing note and vitals reviewed. Constitutional: She is oriented to person, place, and time. She appears well-developed and well-nourished.       In moderate distress with shortness of breath. She is breathing nasal oxygen.  HENT:  Head: Normocephalic and atraumatic.  Right Ear: External ear normal.  Left Ear: External ear normal.       Her face has a puffy appearance.  Eyes: Conjunctivae and EOM are normal. Pupils are equal, round, and reactive to light. No scleral icterus.  Neck: Normal range of motion. Neck supple.  Cardiovascular: Normal rate, regular rhythm and normal heart sounds.   Pulmonary/Chest: Effort normal.       Breath sounds distant.  Abdominal: Soft.  Musculoskeletal:       3+ pitting edema extending up to her knees bilaterally.  Neurological: She is alert and oriented to person, place, and time.       No sensory or motor deficit.  Skin: Skin is warm and dry.  Psychiatric: She has a normal mood and affect. Her behavior is normal.    ED Course  Procedures (including critical care time)   6:39 PM  Date: 03/25/2012  Rate: 100  Rhythm: normal sinus rhythm  QRS Axis: right  Intervals: normal  ST/T Wave abnormalities: nonspecific T wave changes  Conduction Disutrbances:none  Narrative Interpretation: Abnormal EKG.   Old EKG Reviewed: unchanged  7:41 PM Pt seen --> physical exam performed.  Lab workup ordered.  Old charts reviewed.   10:24 PM Results for orders placed during the hospital encounter of 03/25/12  CBC      Component Value Range   WBC 9.7  4.0 -  10.5 (K/uL)   RBC 5.09  3.87 - 5.11 (MIL/uL)   Hemoglobin 15.1 (*) 12.0 - 15.0 (g/dL)   HCT 09.8 (*) 11.9 - 46.0 (%)   MCV 95.1  78.0 - 100.0 (fL)   MCH 29.7  26.0 - 34.0 (pg)   MCHC 31.2  30.0 - 36.0 (g/dL)   RDW 14.7  82.9 - 56.2 (%)   Platelets 178  150 - 400 (K/uL)  DIFFERENTIAL      Component Value Range   Neutrophils Relative 69  43 - 77 (%)   Neutro Abs 6.6  1.7 - 7.7 (K/uL)   Lymphocytes Relative 17  12 -  46 (%)   Lymphs Abs 1.6  0.7 - 4.0 (K/uL)   Monocytes Relative 11  3 - 12 (%)   Monocytes Absolute 1.1 (*) 0.1 - 1.0 (K/uL)   Eosinophils Relative 3  0 - 5 (%)   Eosinophils Absolute 0.3  0.0 - 0.7 (K/uL)   Basophils Relative 0  0 - 1 (%)   Basophils Absolute 0.0  0.0 - 0.1 (K/uL)  COMPREHENSIVE METABOLIC PANEL      Component Value Range   Sodium 138  135 - 145 (mEq/L)   Potassium 4.0  3.5 - 5.1 (mEq/L)   Chloride 90 (*) 96 - 112 (mEq/L)   CO2 41 (*) 19 - 32 (mEq/L)   Glucose, Bld 81  70 - 99 (mg/dL)   BUN 8  6 - 23 (mg/dL)   Creatinine, Ser 1.61  0.50 - 1.10 (mg/dL)   Calcium 9.3  8.4 - 09.6 (mg/dL)   Total Protein 6.7  6.0 - 8.3 (g/dL)   Albumin 3.4 (*) 3.5 - 5.2 (g/dL)   AST 25  0 - 37 (U/L)   ALT 20  0 - 35 (U/L)   Alkaline Phosphatase 71  39 - 117 (U/L)   Total Bilirubin 0.7  0.3 - 1.2 (mg/dL)   GFR calc non Af Amer >90  >90 (mL/min)   GFR calc Af Amer >90  >90 (mL/min)  URINALYSIS, ROUTINE W REFLEX MICROSCOPIC      Component Value Range   Color, Urine YELLOW  YELLOW    APPearance CLOUDY (*) CLEAR    Specific Gravity, Urine 1.015  1.005 - 1.030    pH 8.5 (*) 5.0 - 8.0    Glucose, UA NEGATIVE  NEGATIVE (mg/dL)   Hgb urine dipstick NEGATIVE  NEGATIVE    Bilirubin Urine NEGATIVE  NEGATIVE    Ketones, ur NEGATIVE  NEGATIVE (mg/dL)   Protein, ur 30 (*) NEGATIVE (mg/dL)   Urobilinogen, UA 0.2  0.0 - 1.0 (mg/dL)   Nitrite NEGATIVE  NEGATIVE    Leukocytes, UA NEGATIVE  NEGATIVE   PROTIME-INR      Component Value Range   Prothrombin Time 15.9 (*) 11.6 -  15.2 (seconds)   INR 1.24  0.00 - 1.49   APTT      Component Value Range   aPTT 31  24 - 37 (seconds)  PRO B NATRIURETIC PEPTIDE      Component Value Range   Pro B Natriuretic peptide (BNP) 937.0 (*) 0 - 125 (pg/mL)  URINE MICROSCOPIC-ADD ON      Component Value Range   Squamous Epithelial / LPF MANY (*) RARE    WBC, UA 0-2  <3 (WBC/hpf)   Dg Chest 2 View  03/20/2012  *RADIOLOGY REPORT*  Clinical Data: History of shortness of breath and weakness.  CHEST - 2 VIEW  Comparison: 01/24/2012.  03/06/2012.  03/19/2012.  CT 03/06/2012.  Findings: There is stable mild enlargement of the cardiac silhouette.  There is enlargement of the main pulmonary artery segment.  Hazy opacity in the right base has decreased since previous study. There is blunting of the right lateral costophrenic angle and both posterior costophrenic angles consistent with small pleural effusions.  Minimal atelectasis is seen.  No consolidation is evident. Bones appear average for age.  IMPRESSION: Stable mild enlargement cardiac silhouette.  Enlargement of main pulmonary artery segment.  Decrease haziness in right base since previous study.  Small bilateral pleural effusions.  Minimal basilar atelectasis.  Original Report Authenticated By: Crawford Givens, M.D.  Dg Chest 2 View  03/06/2012  *RADIOLOGY REPORT*  Clinical Data: Right-sided chest pain.  Swelling.  CHEST - 2 VIEW  Comparison: 01/24/2012.  Findings: Peribronchial thickening may represent changes of chronic bronchitis.  Basilar scarring.  No segmental infiltrate or pneumothorax.  Prominence right hilar region.  This may represent overlapping vascular structures however, adenopathy or mass not excluded.  Heart size top normal.  IMPRESSION: Peribronchial thickening may represent changes of chronic bronchitis.  Prominence right hilar region.  This may represent overlapping vascular structures however, adenopathy or mass not excluded.  Original Report Authenticated By: Fuller Canada,  M.D.   Ct Angio Chest W/cm &/or Wo Cm  03/07/2012  **ADDENDUM** CREATED: 03/07/2012 15:35:11  Per discussion with Dr. Bard Herbert, the patient had a right heart cath which  demonstrated elevated right heart pressures which corresponds to the enlarged right atrium and right ventricle and right main pulmonary artery and most likely is responsible for the ascites and third spacing of fluid.  With regard to the possibility pulmonary embolus, the present examination is limited by respiratory motion. Separate from the motion artifact is suggestion of a tiny filling defect within right middle lobe pulmonary branch vessel (series 6 images 105 through 108, series 602 image 31 and is series 603 image 43).  Chronic lung changes may also contribute to the elevated right heart pressures.  **END ADDENDUM** SIGNED BY: Almedia Balls. Constance Goltz, M.D.    03/06/2012  **ADDENDUM** CREATED: 03/06/2012 10:02:30  Contrast utilized was 100 ml of Omnipaque 350.  **END ADDENDUM** SIGNED BY: Almedia Balls. Constance Goltz, M.D.    03/06/2012  *RADIOLOGY REPORT*  Clinical Data: Right upper extremity swelling.  Question abnormal chest x-ray.  CT ANGIOGRAPHY CHEST  Technique:  Multidetector CT imaging of the chest using the standard protocol during bolus administration of intravenous contrast. Multiplanar reconstructed images including MIPs were obtained and reviewed to evaluate the vascular anatomy.  Contrast:  Comparison: 03/06/2012 chest x-ray.  Findings: Filling defect right internal jugular vein/subclavian vein junction just proximal to the formation of the superior vena cava involving the right subclavian vein.  Marked  right chest wall edema involving the right breast and right axilla.  Right axillary adenopathy.  No pulmonary embolus (probable motion artifact involving the right lower lobe pulmonary vessels).  No aortic dissection.  Mediastinal and right hilar adenopathy.  Scattered pulmonary parenchymal changes without dominant worrisome mass.  Patchy parenchymal  changes most notable right upper lobe. This can be reevaluated follow-up after acute episode has cleared.  Small right-sided pleural effusion.  Right heart enlargement. Small superiorly located pericardial effusion.  No bony destructive lesion.  Ascites.  Slight lobularity liver without other findings of cirrhosis.  Mild third spacing of fluid abdominal wall.  IMPRESSION: Thrombosed right subclavian vein.  Marked  right chest wall edema involving the right breast and right axilla.  Right axillary adenopathy.  No pulmonary embolus (probable motion artifact involving the right lower lobe pulmonary vessels).  Mediastinal and right hilar adenopathy.  Scattered pulmonary parenchymal changes without dominant worrisome mass.  Patchy parenchymal changes most notable right upper lobe. This can be reevaluated follow-up after acute episode has cleared.  Small right-sided pleural effusion.  Right heart enlargement. Small superiorly located pericardial effusion.  Ascites.  Slight lobularity liver without other findings of cirrhosis.  Mild third spacing of fluid abdominal wall.  Original Report Authenticated By: Fuller Canada, M.D.   US Abdomen Complete  02/28/2012  *RADIOLOGY REPORT*  Clinical Data:  Abdominal pain and bloating.  COMPLETE  ABDOMINAL ULTRASOUND  Comparison:  None.  Findings:  Gallbladder:  Contracted.  No stones, wall thickening or sonographic Murphy's sign.  Common bile duct:  Measures 4 mm, within normal limits.  Liver:  Appears mildly increased in echogenicity.  No focal lesions.  IVC:  Visualization is limited by bowel gas.  Pancreas:  No focal abnormality seen.  Spleen:  Measures 11.9 cm, negative.  Right Kidney:  Measures 11.9 cm with normal parenchymal echogenicity.  No hydronephrosis.  Left Kidney:  Measures 11.4 cm with normal parenchymal echogenicity.  No hydronephrosis.  Abdominal aorta:  Mid and distal abdominal aorta are obscured by bowel gas.  Otherwise, no aneurysm.  Additional finding:  Small  amount of ascites is seen in the left lower quadrant and pelvis.  No postvoid residual in the bladder.  IMPRESSION:  1.  Small ascites in the left lower quadrant and anatomic pelvis. 2.  No evidence of urinary retention. 3.  Question hepatic steatosis.  Original Report Authenticated By: Reyes Ivan, M.D.   Ct Abdomen Pelvis W Contrast  03/07/2012  *RADIOLOGY REPORT*  Clinical Data: Right-sided edema and adenopathy  CT ABDOMEN AND PELVIS WITH CONTRAST  Technique:  Multidetector CT imaging of the abdomen and pelvis was performed following the standard protocol during bolus administration of intravenous contrast.  Contrast:  80 ml of omni 300  Comparison: None.  Findings:  There are small bilateral pleural effusions, right greater than left.  The mild interlobular septal thickening is noted consistent with mild interstitial edema.  There is diffuse body wall edema identified.  Focal area of low density adjacent to the falciform ligament is identified unlike lie represents focal fatty deposition.  There is mild edema of the gallbladder wall.  Non-specific in a hypoproteinemic and volume overload state.  No biliary dilatation.  The pancreas appears normal.  The spleen is negative.  Both adrenal glands are normal.  The kidneys are both unremarkable.  There are multiple upper abdominal lymph nodes but no evidence for adenopathy.  There is no pelvic or inguinal adenopathy. Intermediate density fluid within the urinary bladder is present.  This may reflect excreted contrast material from recent chest CT.  There is no pelvic or inguinal adenopathy.  The stomach and the small bowel loops appear normal.  The appendix is identified and is normal.  The colon is negative.  Moderate ascites identified within the abdomen and pelvis.  IMPRESSION:  1.  Findings compatible with fluid overload state with ascites, body wall edema, and pleural effusions. 2.  No mass or adenopathy identified.  Original Report Authenticated By:  Rosealee Albee, M.D.   Dg Chest Port 1 View  03/25/2012  *RADIOLOGY REPORT*  Clinical Data: Shortness of breath, COPD no CHF  PORTABLE CHEST - 1 VIEW  Comparison: 03/20/2012  Findings: Mild patchy left basilar opacity, possibly atelectasis. Lungs are otherwise clear.  Possible small left pleural effusion. No pneumothorax.  Heart is top normal in size.  IMPRESSION: Mild patchy left basilar opacity, possibly atelectasis, with possible small left pleural effusion with  Original Report Authenticated By: Charline Bills, M.D.   Dg Chest Port 1 View  03/19/2012  *RADIOLOGY REPORT*  Clinical Data: Shortness of breath.  PORTABLE CHEST - 1 VIEW  Comparison: Chest radiograph and CTA of the chest performed 03/06/2012  Findings: The lungs are well-aerated.  On the prior study, note was made of mild patchy airspace opacity, which could have reflected mild pneumonia or possibly atelectasis; this is slightly more apparent at the  lung bases on the current study.  There is no evidence of pleural effusion or pneumothorax.  The cardiomediastinal silhouette is within normal limits.  No acute osseous abnormalities are seen.  IMPRESSION: On the prior CTA, note was made of mild patchy airspace opacity, which could have reflected mild pneumonia; this is slightly more apparent at the lung bases on the current study, raising concern for persistent mild pneumonia.  Would complete treatment for pneumonia, then consider performing follow-up CT in several weeks as previously suggested, to ensure resolution of vague right upper lobe airspace opacity.  Original Report Authenticated By: Tonia Ghent, M.D.    Lab workup shows CHF.  IV lasix and topical NTG ordered.  Call to [facility] to admit pt.      1. Congestive heart failure         Carleene Cooper III, MD 03/25/12 2230

## 2012-03-25 NOTE — ED Notes (Signed)
Patient undressed and in a gown. Cardiac monitor, pulse oximetry, and blood pressure cuff on. 

## 2012-03-25 NOTE — ED Notes (Signed)
Pt was brought in by ambulance with complaint of increasing swelling to BLE, and abdomen with shortness of breath. Pt was just discharged on 03/22/2012. Patient claimed that she had swelling to her legs when she got discharged and her Lasix was increased to 80 mg from 40 mg but her swelling is not better. Pt denies any chest discomfort

## 2012-03-25 NOTE — ED Notes (Signed)
Patient has edema present essentially covering body. Her skin appears very flush especially the lower abdomen.

## 2012-03-25 NOTE — H&P (Signed)
Hospital Admission Note Date: 03/25/2012  Patient name: Mary [name] Medical record number: [id number] Date of birth: [birth year] Age: 36 y.o. Gender: female PCP: Elvina Sidle, MD, MD  Medical Service:[facility] teaching service  Attending physician: Dr. Blanch Media  1st Contact: Dr. [name]    Pager: [phone] 2nd Contact: Dr. Bard Herbert    Pager: [phone]  After 5 pm or weekends: 1st Contact:      Pager: [phone] 2nd Contact:      Pager: [phone]  Chief Complaint: abdominal and legs swelling  History of Present Illness: Mary [name] is a 36 year old female with PMH significant for recent hospital admission for acute respiratory failure and right heart failure as well as hx of heavy alcohol abuse with cirrhosis,OSA, OHS, COPD, pulmonary hypertension, DVT/PE who presents with worsening of abdominal and leg swelling.  Patient was just discharged home on 03/22/12 after treatment of Acute respiratory failure 2/2 alcohol abuse, opioid use and underlying cardio-pulmonary disease. She was also treated for right heart failure/ascites and discharged home with Lasix 40 mg po daily.    She had follow up visit on the very next day after her discharge and was evaluated by her cardiologist Dr. Jens Som who indicated that she was volume overload. Patient was instructed to take an additional 80 mg of Lasix on 03/22/12, 80 mg twice a day 4/18 and then increase to 40 mg twice a day afterwards. However, Patient states that she only took lasix 40 mg po BID. Patient reports that she noticed progressive worsening of her abdominal and leg swellings. She described that her leg swelling started at her ankles and gradually went up to her knees. She noticed that her abdomen and right breast were getting bigger as well. She reports that her SOB has been at her baseline except for orthopnea last night which resolved as soon as she sat up. Denies chest pain, chest pressure or palpitation.  Denies fever, chills or night sweats. Denies sick contact or recent travel.   Today, when her [facility] RN visited her, she was noted to have redness and tenderness on her lower abdomen and right breast. Her HH RN examined her and called Dr. Prescott Gum PA who recommended her to go to ED for further evaluation.    Of note, patient was also admitted on 03/06/12 with right facial, right upper extremity and breast swelling and was found to have a right subclavian DVT and small right middle lobe PE of pulmonary branch vessel on CTA. She has been on coumadin since.    Meds: Medications Prior to Admission  Medication Dose Route Frequency Provider Last Rate Last Dose  . 0.9 %  sodium chloride infusion   Intravenous Continuous Carleene Cooper III, MD 80 mL/hr at 03/25/12 1928    . 0.9 %  sodium chloride infusion   Intravenous Continuous Carleene Cooper III, MD      . furosemide (LASIX) injection 80 mg  80 mg Intravenous Once Carleene Cooper III, MD   80 mg at 03/25/12 2218  . nitroGLYCERIN (NITROGLYN) 2 % ointment 1 inch  1 inch Topical Once Carleene Cooper III, MD   1 inch at 03/25/12 2217   Medications Prior to Admission  Medication Sig Dispense Refill  . albuterol (PROVENTIL HFA;VENTOLIN HFA) 108 (90 BASE) MCG/ACT inhaler Inhale 2 puffs into the lungs every 6 (six) hours as needed. For shortness of breath      . Fluticasone-Salmeterol (ADVAIR) 250-50 MCG/DOSE AEPB Inhale 1 puff into the lungs every 12 (  twelve) hours.      . furosemide (LASIX) 40 MG tablet Take 1 tablet (40 mg total) by mouth 2 (two) times daily.  60 tablet  12  . HYDROcodone-acetaminophen (NORCO) 10-325 MG per tablet Take 0.5 tablets by mouth every 6 (six) hours as needed. For pain      . ibuprofen (ADVIL,MOTRIN) 600 MG tablet Take 600 mg by mouth every 6 (six) hours as needed. For pain      . warfarin (COUMADIN) 7.5 MG tablet Take 0.5 tablets (3.75 mg total) by mouth daily.  30 tablet  0    Allergies: Allergies as of 03/25/2012  . (No Known  Allergies)   Past Medical History  Diagnosis Date  . COPD (chronic obstructive pulmonary disease)     no documented PFTs, but patient said it was confirmed  . Chronic asthma   . History of alcoholism     sober last 5 years  . Tobacco abuse   . Irregular menses   . DVT of upper extremity (deep vein thrombosis) April 2013    right subclavian  . Pulmonary embolus April 2013  . Pulmonary HTN April 2013    Cardiac cath on 03/06/12  . OSA (obstructive sleep apnea)   . Cirrhosis    Past Surgical History  Procedure Date  . Cardiac surgery   . Lung surgery    Family History  Problem Relation Age of Onset  . Hypertension Father   . Heart disease Father     CHF  . Alcohol abuse Father    History   Social History  . Marital Status: Married    Spouse Name: N/A    Number of Children: N/A  . Years of Education: college   Occupational History  .      [employer]   Social History Main Topics  . Smoking status: Former Smoker -- 1.0 packs/day for 25 years    Types: Cigarettes  . Smokeless tobacco: Not on file   Comment: quit smoking 1 week ago  . Alcohol Use: No     One drink per week  . Drug Use: No  . Sexually Active: Yes -- Female partner(s)    Birth Control/ Protection: None   Other Topics Concern  . 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  HEENT: Denies photophobia, eye pain, redness, hearing loss, ear pain, congestion, sore throat, rhinorrhea, sneezing, mouth sores, trouble swallowing, neck pain, neck stiffness and tinnitus.   Respiratory: Denies cough, chest tightness,  and wheezing.  SOB at baseline, + orthopnea  Cardiovascular: Denies chest pain, palpitations. Positive for leg swelling.  Gastrointestinal: Denies nausea, vomiting,  abdominal pain, diarrhea, constipation, blood in stool. positive for abdominal swelling.  Genitourinary: Denies dysuria, urgency, frequency, hematuria, flank pain and difficulty urinating.  Musculoskeletal: Denies myalgias, back pain, joint swelling, arthralgias and gait problem.  Skin: Denies pallor, rash and wound. positive for redness to lower abdomen and right breast Neurological: Denies dizziness, seizures, syncope, weakness, light-headedness, numbness and headaches.  Hematological: Denies adenopathy. Easy bruising, personal or family bleeding history  Psychiatric/Behavioral: Denies suicidal ideation, mood changes, confusion, nervousness, sleep disturbance and agitation  Physical Exam: Blood pressure 119/58, pulse 86, temperature 98.5 F (36.9 C), temperature source Oral, resp. rate 20, height 5\' 2"  (1.575 m),  weight 201 lb (91.173 kg), last menstrual period 01/28/2012, SpO2 96.00%. No significant shortness of breath. She is breathing nasal oxygen General: alert, well-developed, and cooperative to examination.  Head: normocephalic and atraumatic.  Eyes: vision grossly intact, pupils equal, pupils round, pupils reactive to light, no injection and anicteric.  Mouth: pharynx pink and moist, no erythema, and no exudates.  Neck: supple, full ROM, no thyromegaly,  JVD + 6, and no carotid bruits.  Lungs: normal respiratory effort, no accessory muscle use, B/L lung sounds diminished. No rales or wheezing noted.            Right breast erythema, swelling, warm to touch, and tenderness to palpation noted.  Heart: normal rate, regular rhythm, no murmur, no gallop, and no rub.  Abdomen: soft,normal bowel sounds, no distention, no guarding, no rebound tenderness Abdomen distension and wall swelling noted. Generalized erythema, tenderness and warm to touch noted at lower to mid abdomen.   Msk: no joint swelling, no joint warmth, and no redness over joints.  Pulses: 2+ DP/PT pulses  bilaterally Extremities: No cyanosis, clubbing. B/L LE 3-4+ pitting edema up to the knees. Neurologic: alert & oriented X3, cranial nerves II-XII intact, strength normal in all extremities, sensation intact to light touch, and gait normal.  Skin: turgor normal and no rashes.  Psych: Oriented X3, memory intact for recent and remote, normally interactive, good eye contact, not anxious appearing, and not depressed appearing.    Lab results: Basic Metabolic Panel:  [facility] 03/25/12 1920  NA 138  K 4.0  CL 90*  CO2 41*  GLUCOSE 81  BUN 8  CREATININE 0.55  CALCIUM 9.3  MG --  PHOS --   Liver Function Tests:  [facility] 03/25/12 1920  AST 25  ALT 20  ALKPHOS 71  BILITOT 0.7  PROT 6.7  ALBUMIN 3.4*   CBC:  Basename 03/25/12 1920  WBC 9.7  NEUTROABS 6.6  HGB 15.1*  HCT 48.4*  MCV 95.1  PLT 178    BNP:  Basename 03/25/12 1920  PROBNP 937.0*   Coagulation:  Basename 03/25/12 1920  LABPROT 15.9*  INR 1.24   Urinalysis:  Basename 03/25/12 1922  COLORURINE YELLOW  LABSPEC 1.015  PHURINE 8.5*  GLUCOSEU NEGATIVE  HGBUR NEGATIVE  BILIRUBINUR NEGATIVE  KETONESUR NEGATIVE  PROTEINUR 30*  UROBILINOGEN 0.2  NITRITE NEGATIVE  LEUKOCYTESUR NEGATIVE    Imaging results:  Dg Chest Port 1 View  03/25/2012  *RADIOLOGY REPORT*  Clinical Data: Shortness of breath, COPD no CHF  PORTABLE CHEST - 1 VIEW  Comparison: 03/20/2012  Findings: Mild patchy left basilar opacity, possibly atelectasis. Lungs are otherwise clear.  Possible small left pleural effusion. No pneumothorax.  Heart is top normal in size.  IMPRESSION: Mild patchy left basilar opacity, possibly atelectasis, with possible small left pleural effusion with  Original Report Authenticated By: Charline Bills, M.D.    Other results: AVW:UJWJX tachycardia. Right axis deviation   Assessment & Plan by Problem:  36 y/o w with pmh of pulmonary HTN, OSA/OHS, PE and DVT and recent admissions for acute respiratory  failure and right heart failure comes to the ED with worsening abdomen and leg swelling  1. Peripheral edema and mild shortness of breath- Most likely Right heart failure causing volume overload. Cause of this seems to be worsening right heart contractility and pulmonary HTN by exclusion as patient has been compliant with her BiPAP, diuretics and other medications.  And chronic OSA, OHS and recent PE all contribute to the Right heart  strain.   Her INR is sub therapeutic so PE is a likely possibility , however, the clinical picture is less likely PE because the main symptoms are abd and leg swelling which have been gradually getting worse over past 1 week and her SOB has been at baseline.   Her EKG does not show and new St/T changes suggestive of ACS.   Plan  - Admit to telemetry  - Salt restricted diet  - diuresis>>>Lasix 40 iv bid with caution as this may reduce preload and right sided cardiac contractility  - Coumadin for PE/DVT  - BiPAP qhs and 24 hour oxygen therapy (4l by [place])  - Albuterol/advair for COPD  - Repeat Echo, CT angio if condition worsens.  - CXR, BMP in am   2. Cellulitis- in setting of chronic peripheral edema. Treat with po doxycycline. Vicodin for pain  3. H/o DVT/PE- Coumadin per pharmacy  4. OHS/OSA- BiPAP qhs  5. Alcohol, smoking- quit.  6. COPD- advair and albuterol.  7. DVT Px- Coumadin  8. Code- full    Signed: LI, NA 03/25/2012, 11:36 PM

## 2012-03-26 ENCOUNTER — Encounter (HOSPITAL_COMMUNITY): Payer: Self-pay | Admitting: General Practice

## 2012-03-26 ENCOUNTER — Inpatient Hospital Stay (HOSPITAL_COMMUNITY): Payer: 59

## 2012-03-26 LAB — CARDIAC PANEL(CRET KIN+CKTOT+MB+TROPI)
CK, MB: 2.1 ng/mL (ref 0.3–4.0)
CK, MB: 2 ng/mL (ref 0.3–4.0)
CK, MB: 1.9 ng/mL (ref 0.3–4.0)
Relative Index: INVALID (ref 0.0–2.5)
Total CK: 30 U/L (ref 7–177)

## 2012-03-26 LAB — BASIC METABOLIC PANEL
Chloride: 91 mEq/L — ABNORMAL LOW (ref 96–112)
GFR calc Af Amer: >90 mL/min (ref >90)
GFR calc non Af Amer: >90 mL/min (ref >90)
Glucose, Bld: 89 mg/dL (ref 70–99)
Potassium: 3 mEq/L — ABNORMAL LOW (ref 3.5–5.1)
Sodium: 142 mEq/L (ref 135–145)

## 2012-03-26 LAB — PROTIME-INR
INR: 1.37 (ref 0.00–1.49)
Prothrombin Time: 17.1 seconds — ABNORMAL HIGH (ref 11.6–15.2)

## 2012-03-26 LAB — CBC
Hemoglobin: 14.2 g/dL (ref 12.0–15.0)
RBC: 4.9 MIL/uL (ref 3.87–5.11)

## 2012-03-26 MED ORDER — SODIUM CHLORIDE 0.9 % IJ SOLN: 3.0000 mL | INTRAMUSCULAR | Status: DC | PRN

## 2012-03-26 MED ORDER — ENOXAPARIN SODIUM 40 MG/0.4ML ~~LOC~~ SOLN
40.0000 mg | SUBCUTANEOUS | Status: DC
  Administered 2012-03-26: 40 mg via SUBCUTANEOUS
  Filled 2012-03-26 (×2): qty 0.4

## 2012-03-26 MED ORDER — DOXYCYCLINE HYCLATE 100 MG PO TABS
100.0000 mg | ORAL_TABLET | Freq: Two times a day (BID) | ORAL | Status: DC
  Administered 2012-03-26 (×3): 100 mg via ORAL
  Filled 2012-03-26 (×5): qty 1

## 2012-03-26 MED ORDER — SODIUM CHLORIDE 0.9 % IV SOLN: 250.0000 mL | INTRAVENOUS | Status: DC | PRN

## 2012-03-26 MED ORDER — ONDANSETRON HCL 4 MG/2ML IJ SOLN: 4.0000 mg | Freq: Four times a day (QID) | INTRAMUSCULAR | Status: DC | PRN

## 2012-03-26 MED ORDER — FLUTICASONE-SALMETEROL 250-50 MCG/DOSE IN AEPB
1.0000 | INHALATION_SPRAY | Freq: Two times a day (BID) | RESPIRATORY_TRACT | Status: DC
  Administered 2012-03-26 – 2012-03-29 (×7): 1 via RESPIRATORY_TRACT
  Filled 2012-03-26: qty 14

## 2012-03-26 MED ORDER — FUROSEMIDE 10 MG/ML IJ SOLN
40.0000 mg | Freq: Two times a day (BID) | INTRAMUSCULAR | Status: AC
  Administered 2012-03-26 – 2012-03-27 (×4): 40 mg via INTRAVENOUS
  Filled 2012-03-26 (×5): qty 4

## 2012-03-26 MED ORDER — ENOXAPARIN SODIUM 40 MG/0.4ML ~~LOC~~ SOLN
40.0000 mg | Freq: Every day | SUBCUTANEOUS | Status: DC
  Filled 2012-03-26: qty 0.4

## 2012-03-26 MED ORDER — HYDROCODONE-ACETAMINOPHEN 10-325 MG PO TABS
0.5000 | ORAL_TABLET | Freq: Four times a day (QID) | ORAL | Status: DC | PRN
  Administered 2012-03-26: 0.5 via ORAL
  Administered 2012-03-27: 16:00:00 via ORAL
  Administered 2012-03-28: 0.5 via ORAL
  Filled 2012-03-26 (×4): qty 1

## 2012-03-26 MED ORDER — WARFARIN - PHYSICIAN DOSING INPATIENT: Freq: Every day | Status: DC

## 2012-03-26 MED ORDER — ALBUTEROL SULFATE HFA 108 (90 BASE) MCG/ACT IN AERS: 2.0000 | INHALATION_SPRAY | Freq: Four times a day (QID) | RESPIRATORY_TRACT | Status: DC | PRN

## 2012-03-26 MED ORDER — WARFARIN SODIUM 5 MG PO TABS
5.0000 mg | ORAL_TABLET | Freq: Once | ORAL | Status: AC
  Administered 2012-03-26: 5 mg via ORAL
  Filled 2012-03-26: qty 1

## 2012-03-26 MED ORDER — WARFARIN - PHARMACIST DOSING INPATIENT
Freq: Every day | Status: DC
  Administered 2012-03-26: 18:00:00

## 2012-03-26 MED ORDER — SODIUM CHLORIDE 0.9 % IJ SOLN
3.0000 mL | Freq: Two times a day (BID) | INTRAMUSCULAR | Status: DC
  Administered 2012-03-26 – 2012-03-28 (×5): 3 mL via INTRAVENOUS

## 2012-03-26 MED ORDER — POTASSIUM CHLORIDE CRYS ER 20 MEQ PO TBCR
40.0000 meq | EXTENDED_RELEASE_TABLET | ORAL | Status: AC
  Administered 2012-03-26 (×2): 40 meq via ORAL
  Filled 2012-03-26 (×3): qty 2

## 2012-03-26 MED ORDER — WARFARIN SODIUM 2.5 MG PO TABS
3.7500 mg | ORAL_TABLET | Freq: Every day | ORAL | Status: DC
  Administered 2012-03-26: 3.75 mg via ORAL
  Filled 2012-03-26 (×2): qty 1.5

## 2012-03-26 MED ORDER — ONDANSETRON HCL 4 MG PO TABS: 4.0000 mg | ORAL_TABLET | Freq: Four times a day (QID) | ORAL | Status: DC | PRN

## 2012-03-26 NOTE — Progress Notes (Signed)
Admitted pt to rm [id number] from ED via stretcher, alert and oriented, placed on bed comfortably, SR on heart monitor, admission assessment done, Heart failure booklet given, education initiated, initial  orders carried out. Will monitor.

## 2012-03-26 NOTE — Progress Notes (Signed)
Subjective: The patient was admitted overnight with ascites and LE edema, as well as abdominal erythema.  The patient was discharged after last hospitalization 4 days ago on lasix 40 daily. The next day, she notes gaining 2 pounds, and noted significantly worsening LE, RUE, R breast, and abdominal edema.  On that day (3 days ago), she presented to Dr. Jens Som, who recommended taking an additional 80 of lasix that day, then lasix 80 BID the next day, then 40 BID afterwards.  The patient only took 40 BID.  She continued to note worsening edema, her [facility] RN called Dr. Prescott Gum office, who recommended that she present to the ED.  The patient has been started on IV lasix, 40 BID, and notes mild improvement in edema.  Objective: Vital signs in last 24 hours: Filed Vitals:   03/26/12 0221 03/26/12 0651 03/26/12 0802 03/26/12 0906  BP: 124/81 119/76 99/72   Pulse: 104 95 89   Temp: 97.9 F (36.6 C) 97.7 F (36.5 C) 97.9 F (36.6 C)   TempSrc: Axillary Oral Oral   Resp: 20 21 22    Height:      Weight:      SpO2: 91% 91% 93% 92%   Weight change:   Intake/Output Summary (Last 24 hours) at 03/26/12 1058 Last data filed at 03/26/12 1000  Gross per 24 hour  Intake    360 ml  Output   1875 ml  Net  -1515 ml   Physical Exam: General: alert, cooperative, and in no apparent distress HEENT: pupils equal round and reactive to light, vision grossly intact, oropharynx clear and non-erythematous  Neck: supple, no lymphadenopathy Lungs: clear to ascultation bilaterally, normal work of respiration, no wheezes, rales, ronchi Heart: regular rate and rhythm, no murmurs, gallops, or rubs Abdomen: soft, mildly tender to palpation, large approx 5 x 30 cm area of erythema and warmth noted on patient's abdomen, distended, no guarding or rebound tenderness Extremities: 2+ bilateral pitting edema.  RUE with non-pitting edema.  Right breast mildly erythematous and with non-pitting edema. Neurologic: alert &  oriented X3, cranial nerves II-XII intact, strength grossly intact, sensation intact to light touch  Lab Results: Basic Metabolic Panel:  Lab 03/26/12 4098 03/25/12 1920  NA 142 138  K 3.0* 4.0  CL 91* 90*  CO2 42* 41*  GLUCOSE 89 81  BUN 6 8  CREATININE 0.50 0.55  CALCIUM 8.9 9.3  MG -- --  PHOS -- --   Liver Function Tests:  Lab 03/25/12 1920 03/21/12 0415  AST 25 26  ALT 20 29  ALKPHOS 71 69  BILITOT 0.7 0.4  PROT 6.7 5.9*  ALBUMIN 3.4* 2.9*   CBC:  Lab 03/26/12 0500 03/25/12 1920 03/19/12 2158  WBC 9.6 9.7 --  NEUTROABS -- 6.6 10.7*  HGB 14.2 15.1* --  HCT 46.8* 48.4* --  MCV 95.5 95.1 --  PLT 175 178 --   Cardiac Enzymes:  Lab 03/26/12 0740 03/20/12 1651 03/20/12 0845  CKTOTAL 30 28 23   CKMB 2.1 2.8 2.8  CKMBINDEX -- -- --  TROPONINI <0.30 <0.30 <0.30   BNP:  Lab 03/25/12 1920 03/19/12 2159  PROBNP 937.0* 5727.0*   Coagulation:  Lab 03/25/12 1920 03/22/12 0930 03/21/12 0415 03/19/12 2158  LABPROT 15.9* 25.1* 35.4* 42.2*  INR 1.24 2.23* 3.47* 4.34*    Urinalysis:  Lab 03/25/12 1922 03/20/12 0212  COLORURINE YELLOW YELLOW  LABSPEC 1.015 1.021  PHURINE 8.5* 5.0  GLUCOSEU NEGATIVE NEGATIVE  HGBUR NEGATIVE NEGATIVE  BILIRUBINUR NEGATIVE NEGATIVE  KETONESUR NEGATIVE NEGATIVE  PROTEINUR 30* 30*  UROBILINOGEN 0.2 0.2  NITRITE NEGATIVE POSITIVE*  LEUKOCYTESUR NEGATIVE NEGATIVE    Micro Results: Recent Results (from the past 240 hour(s))  MRSA PCR SCREENING     Status: Normal   Collection Time   03/20/12  3:47 AM      Component Value Range Status Comment   MRSA by PCR NEGATIVE  NEGATIVE  Final    Studies/Results: X-ray Chest Pa And Lateral   03/26/2012  *RADIOLOGY REPORT*  Clinical Data: CHF.  Cellulitis.  Swelling.  History of pulmonary hypertension, COPD.  History of smoking.  CHEST - 2 VIEW  Comparison: 03/25/2012  Findings: Heart is enlarged.  There is mild prompts interstitial markings.  Question of early infiltrate at the left lung  base. Small bilateral pleural effusions.  IMPRESSION:  1.  Prominent interstitial markings and bronchitic changes. 2.  Question early infiltrate left lung base.  Original Report Authenticated By: Patterson Hammersmith, M.D.   Dg Chest Port 1 View  03/25/2012  *RADIOLOGY REPORT*  Clinical Data: Shortness of breath, COPD no CHF  PORTABLE CHEST - 1 VIEW  Comparison: 03/20/2012  Findings: Mild patchy left basilar opacity, possibly atelectasis. Lungs are otherwise clear.  Possible small left pleural effusion. No pneumothorax.  Heart is top normal in size.  IMPRESSION: Mild patchy left basilar opacity, possibly atelectasis, with possible small left pleural effusion with  Original Report Authenticated By: Charline Bills, M.D.   Medications: I have reviewed the patient's current medications. Scheduled Meds:   . doxycycline  100 mg Oral Q12H  . Fluticasone-Salmeterol  1 puff Inhalation Q12H  . furosemide  40 mg Intravenous BID  . furosemide  80 mg Intravenous Once  . nitroGLYCERIN  1 inch Topical Once  . sodium chloride  3 mL Intravenous Q12H  . warfarin  3.75 mg Oral q1800  . Warfarin - Physician Dosing Inpatient   Does not apply q1800  . DISCONTD: enoxaparin  40 mg Subcutaneous Daily   Continuous Infusions:   . DISCONTD: sodium chloride 80 mL/hr at 03/25/12 1928  . DISCONTD: sodium chloride     PRN Meds:.sodium chloride, albuterol, HYDROcodone-acetaminophen, ondansetron (ZOFRAN) IV, ondansetron, sodium chloride  Assessment/Plan: The patient is a 36 yo woman, history of R-sided CHF, pulm HTN, OHS, presenting with fluid overload.  # Edema - the patient noted LE, RUE and R breast edema, and abdominal ascites on prior hospitalization.  Symptoms are thought to be due to vascular congestion secondary to R heart failure, and symptoms have now worsened after starting Lasix.  Symptoms likely represent components of volume overload, but likely also decreased preload secondary to lasix use, leading to  increased vascular congestion.  RUE and R breast edema likely represents vascular congestion distal to R subclavian DVT. -place compression stockings to help decrease LE edema without dropping preload -continue mild IV lasix at 40 BID, consider switching to a thiazide to minimize preload effects vs adding an NSAID with lasix dose to inhibit prostaglandin-mediated venodilation -continue warfarin for DVT  # ?cellulitis - area of erythema noted on abdomen, diagnosed by admitting physicians as cellulitis.  Patient is currently afebrile, with no leukocytosis.  This erythema more likely just represents overlying skin changes due to edema, rather than true infection. -monitor for fever -continue doxycycline for now, consider d/c'ing tomorrow if afebrile overnight  # Obesity Hypoventilation syndrome - chronic, stable -continue BiPap  # R subclavian DVT/PE - At last hospital admission, patient presented with INR = 4.34 on Warfarin  7.5 mg daily.  This dose was decreased to 3.75 mg daily.  Now patient presents with INR = 1.24. -coumadin per pharmacy consult  # R heart failure - likely secondary to pulm HTN, due to OHS/COPD -see above management of edema and OHS . # Prophy - warfarin, with lovenox while subtherapeutic   LOS: 1 day   [name] 03/26/2012, 10:58 AM

## 2012-03-26 NOTE — Progress Notes (Addendum)
ANTICOAGULATION CONSULT NOTE - Initial Consult  Pharmacy Consult for Coumadin Indication: VTE prophylaxis  No Known Allergies  Patient Measurements: Height: 5\' 2"  (157.5 cm) Weight: 194 lb 0.1 oz (88 kg) IBW/kg (Calculated) : 50.1   Vital Signs: Temp: 97.8 F (36.6 C) (04/21 1400) Temp src: Oral (04/21 1400) BP: 104/66 mmHg (04/21 1400) Pulse Rate: 97  (04/21 1400)  Labs:  Basename 03/26/12 1254 03/26/12 0740 03/26/12 0500 03/25/12 1920  HGB -- -- 14.2 15.1*  HCT -- -- 46.8* 48.4*  PLT -- -- 175 178  APTT -- -- -- 31  LABPROT -- -- -- 15.9*  INR -- -- -- 1.24  HEPARINUNFRC -- -- -- --  CREATININE -- -- 0.50 0.55  CKTOTAL 27 30 -- --  CKMB 2.0 2.1 -- --  TROPONINI <0.30 <0.30 -- --   Estimated Creatinine Clearance: 102.1 ml/min (by C-G formula based on Cr of 0.5).  Medical History: Past Medical History  Diagnosis Date  . COPD (chronic obstructive pulmonary disease)     no documented PFTs, but patient said it was confirmed  . Chronic asthma   . History of alcoholism     sober last 5 years  . Tobacco abuse   . Irregular menses   . DVT of upper extremity (deep vein thrombosis) April 2013    right subclavian  . Pulmonary embolus April 2013  . Pulmonary HTN April 2013    Cardiac cath on 03/06/12  . OSA (obstructive sleep apnea)   . Cirrhosis     Medications:  Scheduled:    . doxycycline  100 mg Oral Q12H  . enoxaparin (LOVENOX) injection  40 mg Subcutaneous Q24H  . Fluticasone-Salmeterol  1 puff Inhalation Q12H  . furosemide  40 mg Intravenous BID  . furosemide  80 mg Intravenous Once  . nitroGLYCERIN  1 inch Topical Once  . potassium chloride  40 mEq Oral Q4H  . sodium chloride  3 mL Intravenous Q12H  . warfarin  5 mg Oral ONCE-1800  . Warfarin - Pharmacist Dosing Inpatient   Does not apply q1800  . DISCONTD: enoxaparin  40 mg Subcutaneous Daily  . DISCONTD: warfarin  3.75 mg Oral q1800  . DISCONTD: Warfarin - Physician Dosing Inpatient   Does not apply  q1800   Infusions:    . DISCONTD: sodium chloride 80 mL/hr at 03/25/12 1928  . DISCONTD: sodium chloride      Assessment: 36 yo F w/ recent admit for acute respiratory failure and CHF. Hx of heavy ETOH abuse, cirrhosis, OSA, OHS, COPD, PAH, hx of DVT/PE. Currently INR is SUBtherapeutic @ 1.37 after 1 dose of 3.75 mg. Home dose of coumadin is 3.75 mg daily. CBC is stable and no bleeding noted. Will give higher dose tonight to get INR therapeutic.   Goal of Therapy:  INR 2-3   Plan:  1) Coumadin 5 mg po x1 dose tonight 2) F/U daily INR  Janace Litten, PharmD [phone] 03/26/2012,3:17 PM

## 2012-03-26 NOTE — Progress Notes (Signed)
Pt Placed on Auto-BIPAP for sleep with 2 LPM O2, Pt tolerating well at this time, RT to monitor and assess as needed

## 2012-03-26 NOTE — Progress Notes (Signed)
I have examined and evaluated Mary [name] and discussed her care with Dr. Dierdre Searles and Dr. Anselm Jungling. I have also reviewed previous notes of residents and attendings and concultations. It appears that the major symptoms of dyspnea and anasarca are secondary to cor pulmonale related mostly to OSA and hypoventilation. Her ascites is most likely also related to pulm HTN and not akcoholism or autoimmune liver disease with negative serologies related to scleroderma or SLE. W/o lier biopsy is possible that she has element of alcohol related cirrhosis. 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 I agree with plans and interventions of housestaff team. Mary [name]

## 2012-03-27 ENCOUNTER — Other Ambulatory Visit: Payer: 59

## 2012-03-27 ENCOUNTER — Ambulatory Visit: Payer: 59

## 2012-03-27 LAB — DIFFERENTIAL
Basophils Relative: 0 % (ref 0–1)
Eosinophils Absolute: 0.4 10*3/uL (ref 0.0–0.7)
Monocytes Absolute: 1.1 10*3/uL — ABNORMAL HIGH (ref 0.1–1.0)
Neutrophils Relative %: 60 % (ref 43–77)

## 2012-03-27 LAB — BASIC METABOLIC PANEL
BUN: 11 mg/dL (ref 6–23)
Calcium: 9.1 mg/dL (ref 8.4–10.5)
Creatinine, Ser: 0.52 mg/dL (ref 0.50–1.10)
GFR calc non Af Amer: >90 mL/min (ref >90)
Glucose, Bld: 99 mg/dL (ref 70–99)
Sodium: 137 mEq/L (ref 135–145)

## 2012-03-27 LAB — PROTIME-INR: Prothrombin Time: 17.3 seconds — ABNORMAL HIGH (ref 11.6–15.2)

## 2012-03-27 LAB — CBC
MCH: 28.8 pg (ref 26.0–34.0)
MCHC: 30 g/dL (ref 30.0–36.0)
Platelets: 156 10*3/uL (ref 150–400)
RBC: 4.97 MIL/uL (ref 3.87–5.11)

## 2012-03-27 MED ORDER — WARFARIN SODIUM 5 MG PO TABS
5.0000 mg | ORAL_TABLET | Freq: Once | ORAL | Status: AC
  Administered 2012-03-27: 5 mg via ORAL
  Filled 2012-03-27: qty 1

## 2012-03-27 MED ORDER — ENOXAPARIN SODIUM 80 MG/0.8ML ~~LOC~~ SOLN
80.0000 mg | SUBCUTANEOUS | Status: AC
  Administered 2012-03-27: 80 mg via SUBCUTANEOUS
  Filled 2012-03-27: qty 0.8

## 2012-03-27 MED ORDER — TORSEMIDE 20 MG PO TABS
20.0000 mg | ORAL_TABLET | Freq: Two times a day (BID) | ORAL | Status: DC
  Administered 2012-03-28 – 2012-03-29 (×3): 20 mg via ORAL
  Filled 2012-03-27 (×4): qty 1

## 2012-03-27 MED ORDER — ENOXAPARIN SODIUM 80 MG/0.8ML ~~LOC~~ SOLN
80.0000 mg | Freq: Two times a day (BID) | SUBCUTANEOUS | Status: DC
  Administered 2012-03-27 – 2012-03-29 (×4): 80 mg via SUBCUTANEOUS
  Filled 2012-03-27 (×5): qty 0.8

## 2012-03-27 NOTE — Progress Notes (Signed)
Patient is not yet ready to go on BiPAP at this time.  Patient stated that she would place herself on BiPAP when she was ready to goto sleep and that she did so the previous night.  RT will continue to monitor.  Gypsy Decant, RRT, RCP

## 2012-03-27 NOTE — Progress Notes (Signed)
ANTICOAGULATION CONSULT NOTE - Follow Up Consult  Pharmacy Consult for coumadin/lovenox Indication: recent PE/DVT  No Known Allergies  Patient Measurements: Height: 5\' 2"  (157.5 cm) Weight: 190 lb 3.2 oz (86.274 kg) (Scale B) IBW/kg (Calculated) : 50.1    Vital Signs: Temp: 98.3 F (36.8 C) (04/22 0621) Temp src: Axillary (04/22 0621) BP: 101/60 mmHg (04/22 0621) Pulse Rate: 92  (04/22 0621)  Labs:  Basename 03/27/12 0619 03/26/12 2015 03/26/12 1454 03/26/12 1254 03/26/12 0740 03/26/12 0500 03/25/12 1920  HGB 14.3 -- -- -- -- 14.2 --  HCT 47.7* -- -- -- -- 46.8* 48.4*  PLT 156 -- -- -- -- 175 178  APTT -- -- -- -- -- -- 31  LABPROT 17.3* -- 17.1* -- -- -- 15.9*  INR 1.39 -- 1.37 -- -- -- 1.24  HEPARINUNFRC -- -- -- -- -- -- --  CREATININE 0.52 -- -- -- -- 0.50 0.55  CKTOTAL -- 29 -- 27 30 -- --  CKMB -- 1.9 -- 2.0 2.1 -- --  TROPONINI -- <0.30 -- <0.30 <0.30 -- --   Estimated Creatinine Clearance: 101 ml/min (by C-G formula based on Cr of 0.52).   Medications:  Scheduled:    . enoxaparin (LOVENOX) injection  40 mg Subcutaneous Q24H  . Fluticasone-Salmeterol  1 puff Inhalation Q12H  . furosemide  40 mg Intravenous BID  . potassium chloride  40 mEq Oral Q4H  . sodium chloride  3 mL Intravenous Q12H  . warfarin  5 mg Oral ONCE-1800  . Warfarin - Pharmacist Dosing Inpatient   Does not apply q1800  . DISCONTD: doxycycline  100 mg Oral Q12H  . DISCONTD: warfarin  3.75 mg Oral q1800  . DISCONTD: Warfarin - Physician Dosing Inpatient   Does not apply q1800    Assessment: 36 yo female with  PE/DVT earlier this month on coumadin PTA and INR is below goal.  Patient to start treatment dose lovenox today until INR is at goal.  Goal of Therapy:  INR 2-3   Plan:  -Coumadin 5mg  today -lovenox 80mg  sq q12h -CBC every 3 days  Benny Lennert 03/27/2012,10:57 AM

## 2012-03-27 NOTE — Telephone Encounter (Signed)
Pt is currently in the hospital as of 03/25/12.

## 2012-03-27 NOTE — Progress Notes (Signed)
Nursing Note: CO2 level 40. MD made aware. No orders received.Wioll continue to monitor pt. Mary [name] Scientist, clinical (histocompatibility and immunogenetics).

## 2012-03-27 NOTE — Progress Notes (Signed)
PGY1 Addendum I agree with excellent MS3 note above.  I have seen and examined patient with MS3. S:  No acute events overnight.  Patient notes decreased swelling in her lower extremities, abdomen, RUE, and R breast, with significantly less LE edema after application of compression stockings.  Abdomen appears somewhat less erythematous and warm than yesterday.  Patient desires to go home, but understands the need for continued hospitalization.  O:  General: alert, cooperative, and in no apparent distress HEENT: pupils equal round and reactive to light, vision grossly intact, oropharynx clear and non-erythematous  Neck: supple, no lymphadenopathy Lungs: clear to ascultation bilaterally, normal work of respiration, no wheezes, rales, ronchi Heart: regular rate and rhythm, no murmurs, gallops, or rubs Abdomen: soft, mildly tender to palpation, large approx 5 x 30 cm area of patchy erythema and warmth noted on patient's abdomen.  Less distended today, no guarding or rebound tenderness  Extremities: 2+ bilateral pitting edema. RUE with non-pitting edema. Right breast mildly erythematous and with non-pitting edema. Neurologic: alert & oriented X3, cranial nerves II-XII intact, strength grossly intact, sensation intact to light touch  I have reviewed labs and imaging.  A/P: The patient is a 36 yo woman, history of R-sided CHF, pulm HTN, OHS, presenting with fluid overload.   # Edema - the patient noted LE, RUE and R breast edema, and abdominal ascites on prior hospitalization. Symptoms are thought to be due to vascular congestion secondary to R heart failure.  RUE and R breast edema likely represents vascular congestion distal to R subclavian DVT.  -continue compression stockings -continue mild IV lasix at 40 BID -continue warfarin for DVT, with treatment dose lovenox  # Abdominal thrombophlebitis - area of erythema noted on abdomen, initially diagnosed as cellulitis (unlikely given no fever, no  leukocytosis) -monitor for fever  -discontinue doxycycline, as unlikely cellulitis  # Obesity Hypoventilation syndrome - chronic, stable  -continue BiPap   # R subclavian DVT/PE - At last hospital admission, patient presented with INR = 4.34 on Warfarin 7.5 mg daily. This dose was decreased to 3.75 mg daily. Now patient presents with INR = 1.24.  -coumadin per pharmacy consult  -lovenox  # R heart failure - likely secondary to pulm HTN, due to OHS/COPD  -see above management of edema and OHS   # Prophy - warfarin, with lovenox while subtherapeutic   Signed [name], PGY1 03/27/2012 11:39 AM

## 2012-03-27 NOTE — Progress Notes (Signed)
03/27/12 1500 UR Completed Tera Mater, RN, BSN Nurse Case Manager [phone]

## 2012-03-27 NOTE — Progress Notes (Signed)
Pt stated that she will place CPAP on herself.  RT to monitor and assess as needed

## 2012-03-27 NOTE — Progress Notes (Signed)
Medical Student Daily Progress Note  Subjective: Patient reports doing well over night. No new issues or complaints. Her BiPAP is being tolerated well and she did not try to pull it off while asleep as in the past. Her edema has come down markedly and she reports much better mobility when attempting to use the restroom 2/2 the fluid loss. She is eager to begin walking around the hallways as well. She reports responding well to the IV Lasix and is resting comfortably in bed upon exam. Lastly, she is very agreeable to the treatment she requires here while in the hospital. She desires to lose the excess fluid she is retaining and to not have to come back to the hospital in the near nor distant future if possible.  Objective: Vital signs in last 24 hours: Filed Vitals:   03/26/12 2331 03/27/12 0303 03/27/12 0621 03/27/12 0700  BP: 96/59 100/62 101/60   Pulse: 94 95 92   Temp: 98.2 F (36.8 C) 97.7 F (36.5 C) 98.3 F (36.8 C)   TempSrc: Axillary Axillary Axillary   Resp: 20 20 20    Height:      Weight:   86.274 kg (190 lb 3.2 oz)   SpO2: 97% 98% 94% 90%   Weight change: -4.899 kg (-10 lb 12.8 oz)  Intake/Output Summary (Last 24 hours) at 03/27/12 0949 Last data filed at 03/27/12 0844  Gross per 24 hour  Intake    960 ml  Output   2900 ml  Net  -1940 ml   Physical Exam: BP 101/60  Pulse 92  Temp(Src) 98.3 F (36.8 C) (Axillary)  Resp 20  Ht 5\' 2"  (1.575 m)  Wt 86.274 kg (190 lb 3.2 oz)  BMI 34.79 kg/m2  SpO2 90%  LMP 01/28/2012 General appearance: alert, cooperative and no distress Head: Normocephalic, without obvious abnormality, atraumatic Back: symmetric, no curvature. ROM normal. No CVA tenderness. Lungs: clear to auscultation bilaterally Breasts: positive findings: edema of skin on the right whole breast and erythema Heart: regular rate and rhythm, S1, S2 normal, no murmur, click, rub or gallop Abdomen: abnormal findings:  erythema, warm to the touch, lower mid abdomen  below the umbilicus Extremities: edema B/L LE, right arm Skin: Skin color, texture, turgor normal. No rashes or lesions Lab Results: Basic Metabolic Panel:  Lab 03/27/12 1324 03/26/12 0500  NA 137 142  K 3.8 3.0*  CL 90* 91*  CO2 40* 42*  GLUCOSE 99 89  BUN 11 6  CREATININE 0.52 0.50  CALCIUM 9.1 8.9  MG -- --  PHOS -- --   Liver Function Tests:  Lab 03/25/12 1920 03/21/12 0415  AST 25 26  ALT 20 29  ALKPHOS 71 69  BILITOT 0.7 0.4  PROT 6.7 5.9*  ALBUMIN 3.4* 2.9*   No results found for this basename: LIPASE:2,AMYLASE:2 in the last 168 hours No results found for this basename: AMMONIA:2 in the last 168 hours CBC:  Lab 03/27/12 0619 03/26/12 0500 03/25/12 1920  WBC 8.0 9.6 --  NEUTROABS 4.8 -- 6.6  HGB 14.3 14.2 --  HCT 47.7* 46.8* --  MCV 96.0 95.5 --  PLT 156 175 --   Cardiac Enzymes:  Lab 03/26/12 2015 03/26/12 1254 03/26/12 0740  CKTOTAL 29 27 30   CKMB 1.9 2.0 2.1  CKMBINDEX -- -- --  TROPONINI <0.30 <0.30 <0.30   BNP:  Lab 03/25/12 1920  PROBNP 937.0*   D-Dimer: No results found for this basename: DDIMER:2 in the last 168 hours CBG: No  results found for this basename: GLUCAP:6 in the last 168 hours Hemoglobin A1C: No results found for this basename: HGBA1C in the last 168 hours Fasting Lipid Panel: No results found for this basename: CHOL,HDL,LDLCALC,TRIG,CHOLHDL,LDLDIRECT in the last 161 hours Thyroid Function Tests: No results found for this basename: TSH,T4TOTAL,FREET4,T3FREE,THYROIDAB in the last 168 hours Coagulation:  Lab 03/27/12 0619 03/26/12 1454 03/25/12 1920 03/22/12 0930  LABPROT 17.3* 17.1* 15.9* 25.1*  INR 1.39 1.37 1.24 2.23*   Anemia Panel: No results found for this basename: VITAMINB12,FOLATE,FERRITIN,TIBC,IRON,RETICCTPCT in the last 168 hours Urine Drug Screen: Drugs of Abuse  No results found for this basename: labopia, cocainscrnur, labbenz, amphetmu, thcu, labbarb    Alcohol Level: No results found for this basename:  ETH:2 in the last 168 hours Urinalysis:  Lab 03/25/12 1922  COLORURINE YELLOW  LABSPEC 1.015  PHURINE 8.5*  GLUCOSEU NEGATIVE  HGBUR NEGATIVE  BILIRUBINUR NEGATIVE  KETONESUR NEGATIVE  PROTEINUR 30*  UROBILINOGEN 0.2  NITRITE NEGATIVE  LEUKOCYTESUR NEGATIVE    Micro Results: Recent Results (from the past 240 hour(s))  MRSA PCR SCREENING     Status: Normal   Collection Time   03/20/12  3:47 AM      Component Value Range Status Comment   MRSA by PCR NEGATIVE  NEGATIVE  Final    Studies/Results: X-ray Chest Pa And Lateral   03/26/2012  *RADIOLOGY REPORT*  Clinical Data: CHF.  Cellulitis.  Swelling.  History of pulmonary hypertension, COPD.  History of smoking.  CHEST - 2 VIEW  Comparison: 03/25/2012  Findings: Heart is enlarged.  There is mild prompts interstitial markings.  Question of early infiltrate at the left lung base. Small bilateral pleural effusions.  IMPRESSION:  1.  Prominent interstitial markings and bronchitic changes. 2.  Question early infiltrate left lung base.  Original Report Authenticated By: Patterson Hammersmith, M.D.   Dg Chest Port 1 View  03/25/2012  *RADIOLOGY REPORT*  Clinical Data: Shortness of breath, COPD no CHF  PORTABLE CHEST - 1 VIEW  Comparison: 03/20/2012  Findings: Mild patchy left basilar opacity, possibly atelectasis. Lungs are otherwise clear.  Possible small left pleural effusion. No pneumothorax.  Heart is top normal in size.  IMPRESSION: Mild patchy left basilar opacity, possibly atelectasis, with possible small left pleural effusion with  Original Report Authenticated By: Charline Bills, M.D.   Medications:  I have reviewed the patient's current medications. Prior to Admission:  Prescriptions prior to admission  Medication Sig Dispense Refill  . albuterol (PROVENTIL HFA;VENTOLIN HFA) 108 (90 BASE) MCG/ACT inhaler Inhale 2 puffs into the lungs every 6 (six) hours as needed. For shortness of breath      . Fluticasone-Salmeterol (ADVAIR) 250-50  MCG/DOSE AEPB Inhale 1 puff into the lungs every 12 (twelve) hours.      . furosemide (LASIX) 40 MG tablet Take 1 tablet (40 mg total) by mouth 2 (two) times daily.  60 tablet  12  . HYDROcodone-acetaminophen (NORCO) 10-325 MG per tablet Take 0.5 tablets by mouth every 6 (six) hours as needed. For pain      . ibuprofen (ADVIL,MOTRIN) 600 MG tablet Take 600 mg by mouth every 6 (six) hours as needed. For pain      . warfarin (COUMADIN) 7.5 MG tablet Take 0.5 tablets (3.75 mg total) by mouth daily.  30 tablet  0   Anti-infectives     Start     Dose/Rate Route Frequency Ordered Stop   03/26/12 0100   doxycycline (VIBRA-TABS) tablet 100 mg  Status:  Discontinued        100 mg Oral Every 12 hours 03/26/12 0010 03/27/12 0926         Scheduled Meds:   . enoxaparin (LOVENOX) injection  40 mg Subcutaneous Q24H  . Fluticasone-Salmeterol  1 puff Inhalation Q12H  . furosemide  40 mg Intravenous BID  . potassium chloride  40 mEq Oral Q4H  . sodium chloride  3 mL Intravenous Q12H  . warfarin  5 mg Oral ONCE-1800  . Warfarin - Pharmacist Dosing Inpatient   Does not apply q1800  . DISCONTD: doxycycline  100 mg Oral Q12H  . DISCONTD: warfarin  3.75 mg Oral q1800  . DISCONTD: Warfarin - Physician Dosing Inpatient   Does not apply q1800   Continuous Infusions:  PRN Meds:.sodium chloride, albuterol, HYDROcodone-acetaminophen, ondansetron (ZOFRAN) IV, ondansetron, sodium chloride Assessment/Plan:  Edema: Patient was admitted for increased swelling and edema in her right breast, right arm, and both lower extremities from her feet to her knees. She was not responding well to her PO lasix dose at home despite the temporary increase. She originally had 3-4+ pitting edema in the LE, and no pitting edema in her R breast and R arm. The LE edema is now 2-3+ and resolving slowly but steadily. -continue IV Lasix today; switch to PO tomorrow (03/28/12) -BMP in am -CBC in am -Increase activity to up as  tolerated  Cellulitis: ED evaluated the new erythematous region on her abdomen as possible cellulitis and began treatment with doxycycline 100mg  BID. The appearance seems to resemble more of an inflammatory response to engorged veins, possibly thrombophlebitis. CBC also has not shown any leukocytosis particularly neutrophils. She has also been afebrile as well since admission. Possibility of cellulitis very highly unlikely. -discontinue doxycycline   Obesity hypoventilation syndrome: She is being treated with home BiPAP at night and has been doing well since her last discharge. SpO2 sats well into the upper 90's consistently. In the past she was having problems with the fit of the mask and dryness, but both these issues have been resolved with no further problems.  -continue BiPAP at night  Upper extremity DVT: History of right subclavian thrombosis 03/06/12 and began treatment with heparin at the time. She has since been bridged over to Coumadin and has been tolerating therapy well. -continue coumadin per pharmacy  Right heart failure: Likely due to her pulmonary HTN which is due to her chronic hypoxia 2/2 OSA/OHS. -see OHS treatment above  DVT prophylaxis: Currently she had been on prophylactic dose of Lovenox since admission (x2days). -change Lovenox to treatment dose per pharmacy until Coumadin is therapeutic  Disposition: She is diuresing well and we will keep her on IV lasix for the rest of today and then transition to PO dose tomorrow and monitor. Goal is for discharge likely on Wednesday if she continues to diurese well on PO lasix and no new issues. She is scheduled for her sleep study on Wednesday night.   LOS: 2 days   This is a Psychologist, occupational Note.  The care of the patient was discussed with Dr. [name] and the assessment and plan formulated with their assistance.  Please see their attached note for official documentation of the daily encounter.  Lewie Chamber 03/27/2012, 9:49  AM

## 2012-03-28 ENCOUNTER — Encounter: Payer: 59 | Admitting: Internal Medicine

## 2012-03-28 ENCOUNTER — Inpatient Hospital Stay (HOSPITAL_COMMUNITY): Payer: 59

## 2012-03-28 LAB — CBC
Hemoglobin: 14.2 g/dL (ref 12.0–15.0)
Platelets: 149 10*3/uL — ABNORMAL LOW (ref 150–400)
RBC: 4.78 MIL/uL (ref 3.87–5.11)
RDW: 15.1 % (ref 11.5–15.5)
WBC: 7.9 10*3/uL (ref 4.0–10.5)

## 2012-03-28 LAB — BASIC METABOLIC PANEL
Calcium: 9.1 mg/dL (ref 8.4–10.5)
Creatinine, Ser: 0.48 mg/dL — ABNORMAL LOW (ref 0.50–1.10)
GFR calc Af Amer: >90 mL/min (ref >90)
Potassium: 3.9 mEq/L (ref 3.5–5.1)

## 2012-03-28 LAB — PROTIME-INR
INR: 1.32 (ref 0.00–1.49)
Prothrombin Time: 16.6 seconds — ABNORMAL HIGH (ref 11.6–15.2)

## 2012-03-28 MED ORDER — IBUPROFEN 200 MG PO TABS
200.0000 mg | ORAL_TABLET | ORAL | Status: DC | PRN
  Administered 2012-03-28: 200 mg via ORAL
  Filled 2012-03-28 (×2): qty 1

## 2012-03-28 MED ORDER — ENOXAPARIN (LOVENOX) PATIENT EDUCATION KIT
PACK | Freq: Once | Status: AC
  Administered 2012-03-28: 18:00:00
  Filled 2012-03-28: qty 1

## 2012-03-28 MED ORDER — HYDROCORTISONE 0.5 % EX CREA
1.0000 "application " | TOPICAL_CREAM | CUTANEOUS | Status: DC | PRN
  Administered 2012-03-29: 1 via TOPICAL
  Filled 2012-03-28 (×2): qty 28.35

## 2012-03-28 MED ORDER — WARFARIN SODIUM 7.5 MG PO TABS
7.5000 mg | ORAL_TABLET | Freq: Once | ORAL | Status: AC
  Administered 2012-03-28: 7.5 mg via ORAL
  Filled 2012-03-28: qty 1

## 2012-03-28 MED ORDER — DIPHENHYDRAMINE HCL 25 MG PO CAPS: 25.0000 mg | ORAL_CAPSULE | Freq: Four times a day (QID) | ORAL | Status: DC | PRN

## 2012-03-28 MED ORDER — DOXYCYCLINE HYCLATE 100 MG PO TABS
100.0000 mg | ORAL_TABLET | Freq: Two times a day (BID) | ORAL | Status: DC
  Administered 2012-03-28 – 2012-03-29 (×3): 100 mg via ORAL
  Filled 2012-03-28 (×4): qty 1

## 2012-03-28 NOTE — Progress Notes (Signed)
PGY1 Addendum I agree with excellent MS3 note above.  I have seen and examined patient with MS3. S:  Patient notes decreased LE swelling today, with I's/O's showing net negative almost 4 L (though patient reports several voiding episodes before starting I's/O's, so true total may be greater.  Patient does note a 1-day history of right-sided chest pain, worse when lying down or taking a deep breath.  O:  General: alert, cooperative, and in no apparent distress HEENT: pupils equal round and reactive to light, vision grossly intact, oropharynx clear and non-erythematous  Neck: supple, no lymphadenopathy Lungs: clear to ascultation bilaterally, normal work of respiration, no wheezes, rales, ronchi Heart: regular rate and rhythm, no murmurs, gallops, or rubs Abdomen: soft, mildly tender to palpation, large area of patchy erythema noted on patient's lower abdomen. Less distended today, no guarding or rebound tenderness  Extremities: trace non-pitting edema. RUE with trace non-pitting edema Neurologic: alert & oriented X3, cranial nerves II-XII intact, strength grossly intact, sensation intact to light touch  I have reviewed labs and imaging.  A/P: The patient is a 36 yo woman, history of R-sided CHF, pulm HTN, OHS, presenting with fluid overload.   # Edema - the patient noted LE, RUE and R breast edema, and abdominal ascites on prior hospitalization. Symptoms are thought to be due to vascular congestion secondary to R heart failure. RUE and R breast edema likely represents vascular congestion distal to R subclavian DVT.  -continue compression stockings  -stop IV lasix, starting PO torsemide 20 mg BID -continue warfarin for DVT, with treatment dose lovenox   # Abdominal thrombophlebitis - area of erythema noted on abdomen.  Area appears to be increasing in size to encompass entire lower abdomen.  Differential includes thrombophlebitis vs contact dermatitis vs ?cellulitis (less likely given lack of  fever or leukocytosis -restart doxycycline -topical hydrocortisone cream -benadryl  # Chest Pain - the patient notes a 1-day history of pleuritic right chest pain.  Patient has a known right middle lobe pulmonary branch vessel PE, which may account for this pain.  Patient's description of pain is also concerning for pericarditis, though this is unlikely. -cxr -ekg -ibuprofen prn  # Obesity Hypoventilation syndrome - chronic, stable  -continue BiPap   # R subclavian DVT/PE - At last hospital admission, patient presented with INR = 4.34 on Warfarin 7.5 mg daily. This dose was decreased to 3.75 mg daily. Now patient presents with INR = 1.24.  -coumadin per pharmacy consult  -lovenox   # R heart failure - likely secondary to pulm HTN, due to OHS/COPD  -see above management of edema and OHS   # Prophy - warfarin, with lovenox while subtherapeutic  Signed [name], PGY1 03/28/2012 12:12 PM

## 2012-03-28 NOTE — Progress Notes (Signed)
Nursing Note: Pt given Lovenox kit and instructed pt on how to self administer Lovenox injection. Pt verbalized understanding and was informed by nurse that the next dose due would be self administered as a returned  demonstration. Pt also has Lovenox video and booklet. Will continue to educate pt appropriately. Razi Hickle Scientist, clinical (histocompatibility and immunogenetics).

## 2012-03-28 NOTE — Progress Notes (Signed)
ANTICOAGULATION CONSULT NOTE - Follow Up Consult  Pharmacy Consult for coumadin/lovenox Indication: recent PE/DVT  No Known Allergies  Patient Measurements: Height: 5\' 2"  (157.5 cm) Weight: 182 lb 1.6 oz (82.6 kg) (scale B) IBW/kg (Calculated) : 50.1    Vital Signs: Temp: 96.6 F (35.9 C) (04/23 0515) Temp src: Axillary (04/23 0515) BP: 105/64 mmHg (04/23 0515) Pulse Rate: 95  (04/23 0515)  Labs:  Basename 03/28/12 0540 03/27/12 0619 03/26/12 2015 03/26/12 1454 03/26/12 1254 03/26/12 0740 03/26/12 0500 03/25/12 1920  HGB 14.2 14.3 -- -- -- -- -- --  HCT 45.0 47.7* -- -- -- -- 46.8* --  PLT 149* 156 -- -- -- -- 175 --  APTT -- -- -- -- -- -- -- 31  LABPROT 16.6* 17.3* -- 17.1* -- -- -- --  INR 1.32 1.39 -- 1.37 -- -- -- --  HEPARINUNFRC -- -- -- -- -- -- -- --  CREATININE 0.48* 0.52 -- -- -- -- 0.50 --  CKTOTAL -- -- 29 -- 27 30 -- --  CKMB -- -- 1.9 -- 2.0 2.1 -- --  TROPONINI -- -- <0.30 -- <0.30 <0.30 -- --   Estimated Creatinine Clearance: 98.7 ml/min (by C-G formula based on Cr of 0.48).   Medications:  Scheduled:     . doxycycline  100 mg Oral Q12H  . enoxaparin (LOVENOX) injection  80 mg Subcutaneous NOW  . enoxaparin (LOVENOX) injection  80 mg Subcutaneous Q12H  . Fluticasone-Salmeterol  1 puff Inhalation Q12H  . furosemide  40 mg Intravenous BID  . sodium chloride  3 mL Intravenous Q12H  . torsemide  20 mg Oral BID  . warfarin  5 mg Oral ONCE-1800  . Warfarin - Pharmacist Dosing Inpatient   Does not apply q1800    Assessment: 36 yo female with  PE/DVT earlier this month on coumadin PTA and INR is below goal.  Patient on treatment dose lovenox until INR is at goal. INR has not changed significantly this admission  Goal of Therapy:  INR 2-3   Plan:  -Increase coumadin to 7.5 mg today -Continue lovenox 80mg  sq q12h  Benny Lennert PharmD 03/28/2012,11:40 AM

## 2012-03-28 NOTE — Progress Notes (Signed)
Medical Student Daily Progress Note  Subjective: Patient reports her edema is doing much better today. The swelling in her legs has improved tremendously however she does have some soreness of her legs where the compression stockings have been. She has been using BiPAP on her own the past 2 nights with no problems and has not tried to pull off her mask while sleeping. She does note that her belly is still red and itchy this morning and the itching seems to be increased today. Also, her "lungs hurt". She says that when lying down or sitting up and coughing/breathing hard she can feel an uncomfortable pressure in the upper right part of her back. Otherwise, she has no other issues and has been diuresing very well throughout her hospital stay. She is resting comfortably in bed upon exam. Objective: Vital signs in last 24 hours: Filed Vitals:   03/27/12 2106 03/27/12 2110 03/28/12 0515 03/28/12 0900  BP: 113/77  105/64   Pulse: 100  95   Temp: 97.1 F (36.2 C)  96.6 F (35.9 C)   TempSrc: Axillary  Axillary   Resp: 20  18   Height:      Weight:   82.6 kg (182 lb 1.6 oz)   SpO2: 95% 96% 92% 92%   Weight change: -3.674 kg (-8 lb 1.6 oz)  Intake/Output Summary (Last 24 hours) at 03/28/12 1117 Last data filed at 03/28/12 0900  Gross per 24 hour  Intake   1684 ml  Output   2275 ml  Net   -591 ml   Physical Exam: BP 105/64  Pulse 95  Temp(Src) 96.6 F (35.9 C) (Axillary)  Resp 18  Ht 5\' 2"  (1.575 m)  Wt 82.6 kg (182 lb 1.6 oz)  BMI 33.31 kg/m2  SpO2 92%  LMP 01/28/2012 General appearance: alert, cooperative and no distress Head: Normocephalic, without obvious abnormality, atraumatic Back: symmetric, no curvature. ROM normal. No CVA tenderness. Lungs: clear to auscultation bilaterally Breasts: positive findings: edema of skin on the right whole breast and erythema Heart: regular rate and rhythm Abdomen: soft, non-tender; bowel sounds normal; no masses,  no organomegaly Extremities:  edema B/L LE and right arm Skin: Skin color, texture, turgor normal. No rashes or lesions Lab Results: Basic Metabolic Panel:  Lab 03/28/12 1610 03/27/12 0619  NA 137 137  K 3.9 3.8  CL 92* 90*  CO2 39* 40*  GLUCOSE 86 99  BUN 10 11  CREATININE 0.48* 0.52  CALCIUM 9.1 9.1  MG -- --  PHOS -- --   Liver Function Tests:  Lab 03/25/12 1920  AST 25  ALT 20  ALKPHOS 71  BILITOT 0.7  PROT 6.7  ALBUMIN 3.4*   No results found for this basename: LIPASE:2,AMYLASE:2 in the last 168 hours No results found for this basename: AMMONIA:2 in the last 168 hours CBC:  Lab 03/28/12 0540 03/27/12 0619 03/25/12 1920  WBC 7.9 8.0 --  NEUTROABS -- 4.8 6.6  HGB 14.2 14.3 --  HCT 45.0 47.7* --  MCV 94.1 96.0 --  PLT 149* 156 --   Cardiac Enzymes:  Lab 03/26/12 2015 03/26/12 1254 03/26/12 0740  CKTOTAL 29 27 30   CKMB 1.9 2.0 2.1  CKMBINDEX -- -- --  TROPONINI <0.30 <0.30 <0.30   BNP:  Lab 03/25/12 1920  PROBNP 937.0*   D-Dimer: No results found for this basename: DDIMER:2 in the last 168 hours CBG: No results found for this basename: GLUCAP:6 in the last 168 hours Hemoglobin A1C: No results  found for this basename: HGBA1C in the last 168 hours Fasting Lipid Panel: No results found for this basename: CHOL,HDL,LDLCALC,TRIG,CHOLHDL,LDLDIRECT in the last 119 hours Thyroid Function Tests: No results found for this basename: TSH,T4TOTAL,FREET4,T3FREE,THYROIDAB in the last 168 hours Coagulation:  Lab 03/28/12 0540 03/27/12 0619 03/26/12 1454 03/25/12 1920  LABPROT 16.6* 17.3* 17.1* 15.9*  INR 1.32 1.39 1.37 1.24   Anemia Panel: No results found for this basename: VITAMINB12,FOLATE,FERRITIN,TIBC,IRON,RETICCTPCT in the last 168 hours Urine Drug Screen: Drugs of Abuse  No results found for this basename: labopia, cocainscrnur, labbenz, amphetmu, thcu, labbarb    Alcohol Level: No results found for this basename: ETH:2 in the last 168 hours Urinalysis:  Lab 03/25/12 1922    COLORURINE YELLOW  LABSPEC 1.015  PHURINE 8.5*  GLUCOSEU NEGATIVE  HGBUR NEGATIVE  BILIRUBINUR NEGATIVE  KETONESUR NEGATIVE  PROTEINUR 30*  UROBILINOGEN 0.2  NITRITE NEGATIVE  LEUKOCYTESUR NEGATIVE    Micro Results: Recent Results (from the past 240 hour(s))  MRSA PCR SCREENING     Status: Normal   Collection Time   03/20/12  3:47 AM      Component Value Range Status Comment   MRSA by PCR NEGATIVE  NEGATIVE  Final    Studies/Results: No results found. Medications:  I have reviewed the patient's current medications. Anti-infectives     Start     Dose/Rate Route Frequency Ordered Stop   03/28/12 1200   doxycycline (VIBRA-TABS) tablet 100 mg        100 mg Oral Every 12 hours 03/28/12 1123     03/26/12 0100   doxycycline (VIBRA-TABS) tablet 100 mg  Status:  Discontinued        100 mg Oral Every 12 hours 03/26/12 0010 03/27/12 0926         Scheduled Meds:   . enoxaparin (LOVENOX) injection  80 mg Subcutaneous NOW  . enoxaparin (LOVENOX) injection  80 mg Subcutaneous Q12H  . Fluticasone-Salmeterol  1 puff Inhalation Q12H  . furosemide  40 mg Intravenous BID  . sodium chloride  3 mL Intravenous Q12H  . torsemide  20 mg Oral BID  . warfarin  5 mg Oral ONCE-1800  . Warfarin - Pharmacist Dosing Inpatient   Does not apply q1800  . DISCONTD: enoxaparin (LOVENOX) injection  40 mg Subcutaneous Q24H   Continuous Infusions:  PRN Meds:.sodium chloride, albuterol, HYDROcodone-acetaminophen, ondansetron (ZOFRAN) IV, ondansetron, sodium chloride Assessment/Plan:  Edema: Her ted hose and IV lasix has diuresed a total of approx 3.7 L since admission. She has been breathing well and her LE edema has improved significantly. She does have some soreness where the stockings have been 2/2 the compression. Overall, very responsive to diuretic therapy and will continue with treatment. -d/c IV lasix, begin torsemide PO in anticipation of d/c tomorrow -continue compression  stockings -activity as tolerated -BMP, CBC in am -daily weights -daily I&O's  Abdominal erythema: She continues to be afebrile (96.6) and has no leukocytosis (WBC=7.9). However, the erythematous region on her abdomen has enlarged overnight and is very itchy today. Likelihood of cellulitis still very low, however in setting of worsening of symptoms will resume doxy course.  -resume doxycycline 100 BID -hydrocortisone cream to abdomen  Back pain: She complains she is having some back pain in her upper right lung/back. Denies any CP, N/V/D, palpitations, fever, malaise or arm pains. Only feels it when she lays on her back or breathes hard or coughs.  -EKG to r/o heart involvement -CXR for continuity of care and thoroughness  Obesity hypoventilation syndrome: She has been on home BiPAP and has a sleep study scheduled for 03/29/12 in order to be eligible to keep her home BiPAP. She has responded well to this therapy and her oxygen sats have remained stable. -continue nightly BiPAP  Upper extremity DVT: History of right subclavian thrombosis 03/06/12. Initial treatment with heparin, followed by warfarin. She had been therapeutic until this admission where her INR fell to below 2. Pharmacy is following her warfarin and bridging with Lovenox until INR>2. She will have Lovenox teaching today in anticipation of going home tomorrow and having to self administer. -Lovenox teaching -continue Coumadin and Lovenox per pharmacy -daily PT/INR  Right heart failure: Secondary to her pulmonary HTN, which is due to chronic hypoxia 2/2 OSA/OHS. -see OHS above  Disposition: Her diuresis has been doing well. She has a sleep study scheduled for tomorrow night (4/24). She will go home on Lovenox and f/u with Dr. Alexandria Lodge in Coumadin clinic on Friday.   LOS: 3 days   This is a Psychologist, occupational Note.  The care of the patient was discussed with Dr. [name] and the assessment and plan formulated with their assistance.   Please see their attached note for official documentation of the daily encounter.  Lewie Chamber 03/28/2012, 11:17 AM

## 2012-03-29 ENCOUNTER — Ambulatory Visit (HOSPITAL_BASED_OUTPATIENT_CLINIC_OR_DEPARTMENT_OTHER): Payer: 59 | Admitting: Radiology

## 2012-03-29 VITALS — Ht 62.0 in | Wt 178.0 lb

## 2012-03-29 DIAGNOSIS — G4730 Sleep apnea, unspecified: Secondary | ICD-10-CM

## 2012-03-29 DIAGNOSIS — J449 Chronic obstructive pulmonary disease, unspecified: Secondary | ICD-10-CM

## 2012-03-29 LAB — PROTIME-INR
INR: 1.27 (ref 0.00–1.49)
Prothrombin Time: 16.2 seconds — ABNORMAL HIGH (ref 11.6–15.2)

## 2012-03-29 LAB — BASIC METABOLIC PANEL
Calcium: 9.4 mg/dL (ref 8.4–10.5)
GFR calc Af Amer: >90 mL/min (ref >90)
GFR calc non Af Amer: >90 mL/min (ref >90)
Glucose, Bld: 80 mg/dL (ref 70–99)
Potassium: 3.8 mEq/L (ref 3.5–5.1)
Sodium: 138 mEq/L (ref 135–145)

## 2012-03-29 LAB — CBC
Hemoglobin: 14.7 g/dL (ref 12.0–15.0)
MCH: 29.1 pg (ref 26.0–34.0)
MCV: 95.2 fL (ref 78.0–100.0)
Platelets: 165 10*3/uL (ref 150–400)
RBC: 5.05 MIL/uL (ref 3.87–5.11)
WBC: 8 10*3/uL (ref 4.0–10.5)

## 2012-03-29 MED ORDER — TORSEMIDE 20 MG PO TABS: 20.0000 mg | ORAL_TABLET | Freq: Two times a day (BID) | ORAL | Status: DC

## 2012-03-29 MED ORDER — DOXYCYCLINE HYCLATE 100 MG PO TABS: 100.0000 mg | ORAL_TABLET | Freq: Two times a day (BID) | ORAL | Status: AC

## 2012-03-29 MED ORDER — ENOXAPARIN SODIUM 80 MG/0.8ML ~~LOC~~ SOLN: 80.0000 mg | Freq: Two times a day (BID) | SUBCUTANEOUS | Status: DC

## 2012-03-29 MED ORDER — HYDROCORTISONE 0.5 % EX CREA: 1.0000 "application " | TOPICAL_CREAM | Freq: Three times a day (TID) | CUTANEOUS | Status: DC | PRN

## 2012-03-29 MED ORDER — HYDROCODONE-ACETAMINOPHEN 5-325 MG PO TABS: 1.0000 | ORAL_TABLET | Freq: Four times a day (QID) | ORAL | Status: AC | PRN

## 2012-03-29 MED ORDER — WARFARIN SODIUM 7.5 MG PO TABS: 3.7500 mg | ORAL_TABLET | Freq: Every day | ORAL | Status: DC

## 2012-03-29 NOTE — Progress Notes (Signed)
Medical Student Daily Progress Note  Subjective: Patient has been using doing well. No acute changes overnight. She reports diuresing as well on her po torsemide as she had been with the IV lasix. She has been educated with Lovenox teaching and if comfortable self administering from home. Objective: Vital signs in last 24 hours: Filed Vitals:   03/28/12 2120 03/29/12 0630 03/29/12 0802 03/29/12 0924  BP:  113/70    Pulse: 90 78    Temp:  97.4 F (36.3 C)    TempSrc:  Oral    Resp: 18 18    Height:      Weight:   81.5 kg (179 lb 10.8 oz)   SpO2:  95%  97%   Weight change:   Intake/Output Summary (Last 24 hours) at 03/29/12 0933 Last data filed at 03/28/12 2257  Gross per 24 hour  Intake   1023 ml  Output   2400 ml  Net  -1377 ml   Physical Exam: BP 113/70  Pulse 78  Temp(Src) 97.4 F (36.3 C) (Oral)  Resp 18  Ht 5\' 2"  (1.575 m)  Wt 81.5 kg (179 lb 10.8 oz)  BMI 32.86 kg/m2  SpO2 97%  LMP 01/28/2012 General appearance: alert, cooperative and no distress Head: Normocephalic, without obvious abnormality, atraumatic Eyes: conjunctivae/corneas clear. PERRL, EOM's intact. Fundi benign. Back: symmetric, no curvature. ROM normal. No CVA tenderness., mild right upper back tenderness Lungs: clear to auscultation bilaterally Breasts: Normal to palpation without dominant masses, positive findings: edema of skin on the right whole breast and mild erythema Heart: regular rate and rhythm Abdomen: normal findings: bowel sounds normal and soft, non-tender and abnormal findings:  warm, itchy erythema lower mid abdomen radiating left and right Extremities: edema improving, but still present B/L LE, right arm Skin: Skin color, texture, turgor normal. No rashes or lesions Lab Results: Basic Metabolic Panel:  Lab 03/29/12 4098 03/28/12 0540  NA 138 137  K 3.8 3.9  CL 93* 92*  CO2 37* 39*  GLUCOSE 80 86  BUN 13 10  CREATININE 0.55 0.48*  CALCIUM 9.4 9.1  MG -- --  PHOS -- --    Liver Function Tests:  Lab 03/25/12 1920  AST 25  ALT 20  ALKPHOS 71  BILITOT 0.7  PROT 6.7  ALBUMIN 3.4*   No results found for this basename: LIPASE:2,AMYLASE:2 in the last 168 hours No results found for this basename: AMMONIA:2 in the last 168 hours CBC:  Lab 03/29/12 0430 03/28/12 0540 03/27/12 0619 03/25/12 1920  WBC 8.0 7.9 -- --  NEUTROABS -- -- 4.8 6.6  HGB 14.7 14.2 -- --  HCT 48.1* 45.0 -- --  MCV 95.2 94.1 -- --  PLT 165 149* -- --   Cardiac Enzymes:  Lab 03/26/12 2015 03/26/12 1254 03/26/12 0740  CKTOTAL 29 27 30   CKMB 1.9 2.0 2.1  CKMBINDEX -- -- --  TROPONINI <0.30 <0.30 <0.30   BNP:  Lab 03/25/12 1920  PROBNP 937.0*   D-Dimer: No results found for this basename: DDIMER:2 in the last 168 hours CBG: No results found for this basename: GLUCAP:6 in the last 168 hours Hemoglobin A1C: No results found for this basename: HGBA1C in the last 168 hours Fasting Lipid Panel: No results found for this basename: CHOL,HDL,LDLCALC,TRIG,CHOLHDL,LDLDIRECT in the last 119 hours Thyroid Function Tests: No results found for this basename: TSH,T4TOTAL,FREET4,T3FREE,THYROIDAB in the last 168 hours Coagulation:  Lab 03/29/12 0430 03/28/12 0540 03/27/12 0619 03/26/12 1454  LABPROT 16.2* 16.6* 17.3* 17.1*  INR 1.27 1.32 1.39 1.37   Anemia Panel: No results found for this basename: VITAMINB12,FOLATE,FERRITIN,TIBC,IRON,RETICCTPCT in the last 168 hours Urine Drug Screen: Drugs of Abuse  No results found for this basename: labopia, cocainscrnur, labbenz, amphetmu, thcu, labbarb    Alcohol Level: No results found for this basename: ETH:2 in the last 168 hours Urinalysis:  Lab 03/25/12 1922  COLORURINE YELLOW  LABSPEC 1.015  PHURINE 8.5*  GLUCOSEU NEGATIVE  HGBUR NEGATIVE  BILIRUBINUR NEGATIVE  KETONESUR NEGATIVE  PROTEINUR 30*  UROBILINOGEN 0.2  NITRITE NEGATIVE  LEUKOCYTESUR NEGATIVE    Micro Results: Recent Results (from the past 240 hour(s))  MRSA  PCR SCREENING     Status: Normal   Collection Time   03/20/12  3:47 AM      Component Value Range Status Comment   MRSA by PCR NEGATIVE  NEGATIVE  Final    Studies/Results: Dg Chest 2 View  03/28/2012  *RADIOLOGY REPORT*  Clinical Data: Right chest pain  CHEST - 2 VIEW  Comparison: 03/26/2012  Findings: Mild left basilar opacity, atelectasis versus pneumonia. Increased interstitial markings without frank interstitial edema. No pleural effusion or pneumothorax.  The heart is normal in size.  Mild degenerative changes of the visualized thoracolumbar spine.  IMPRESSION: Mild left basilar opacity, atelectasis versus pneumonia.  Original Report Authenticated By: Charline Bills, M.D.   Medications:  I have reviewed the patient's current medications. Prior to Admission:  Prescriptions prior to admission  Medication Sig Dispense Refill  . albuterol (PROVENTIL HFA;VENTOLIN HFA) 108 (90 BASE) MCG/ACT inhaler Inhale 2 puffs into the lungs every 6 (six) hours as needed. For shortness of breath      . Fluticasone-Salmeterol (ADVAIR) 250-50 MCG/DOSE AEPB Inhale 1 puff into the lungs every 12 (twelve) hours.      Marland Kitchen HYDROcodone-acetaminophen (NORCO) 10-325 MG per tablet Take 0.5 tablets by mouth every 6 (six) hours as needed. For pain      . ibuprofen (ADVIL,MOTRIN) 600 MG tablet Take 600 mg by mouth every 6 (six) hours as needed. For pain      . warfarin (COUMADIN) 7.5 MG tablet Take 0.5 tablets (3.75 mg total) by mouth daily.  30 tablet  0  . DISCONTD: furosemide (LASIX) 40 MG tablet Take 1 tablet (40 mg total) by mouth 2 (two) times daily.  60 tablet  12   Anti-infectives     Start     Dose/Rate Route Frequency Ordered Stop   03/29/12 0000   doxycycline (VIBRA-TABS) 100 MG tablet        100 mg Oral Every 12 hours 03/29/12 0907 04/01/12 2359   03/28/12 1200   doxycycline (VIBRA-TABS) tablet 100 mg        100 mg Oral Every 12 hours 03/28/12 1123     03/26/12 0100   doxycycline (VIBRA-TABS) tablet 100  mg  Status:  Discontinued        100 mg Oral Every 12 hours 03/26/12 0010 03/27/12 0926         Scheduled Meds:   . doxycycline  100 mg Oral Q12H  . enoxaparin (LOVENOX) injection  80 mg Subcutaneous Q12H  . enoxaparin   Does not apply Once  . Fluticasone-Salmeterol  1 puff Inhalation Q12H  . sodium chloride  3 mL Intravenous Q12H  . torsemide  20 mg Oral BID  . warfarin  7.5 mg Oral ONCE-1800  . Warfarin - Pharmacist Dosing Inpatient   Does not apply q1800   Continuous Infusions:  PRN Meds:.sodium chloride, albuterol, diphenhydrAMINE,  HYDROcodone-acetaminophen, hydrocortisone cream, ibuprofen, ondansetron (ZOFRAN) IV, ondansetron, sodium chloride Assessment/Plan:  Edema: Continued improvement. She was switched to po torsemide yesterday and diuresed almost 800 cc so she is responding well to it. She will go home on BID dosing. Still some soreness from the stockings on her legs, but no major issues. -continue torsemide po BID at home -continue compression stockings -daily weights -f/u BMP, CBC  Abdominal erythema: Afebrile and no leukocytosis for length of admission. Region is still erythematous and itchy. -continue doxy for total of 5 day course (began 03/28/12) -hydrocortisone cream to abdomen  Back pain: She noted some discomfort in her upper right back yesterday. EKG and CXR did not reveal any acute changes that would contribute to this. Likely a component of pluritis 2/2 minor pleural effusion seen on CXR. Will follow up with Dr. Jens Som before d/c to address any concerns he may have over recent EKG. -this will resolve as her body reabsorbs the fluid  Obesity hypoventilation syndrome: Nightly BiPAP with stable oxygen saturations. She has been putting the mask on herself well with no problems. -continue BiPAP at night  Upper extremity DVT: History of right subclavian thrombosis 03/06/12. Initial treatment with heparin, followed by warfarin. She had been therapeutic until this  admission where her INR fell to below 2. She has been receiving Lovenox to bridge her Coumadin and will go home on Lovenox BID and self administer. F/U in coumadin clinic in 2 days to address Lovenox and recheck PT/INR. -Lovenox teaching has been performed -continue Coumadin -f/u PT/INR in coumadin clinic  Right heart failure: Secondary to her pulmonary HTN, which is due to chronic hypoxia 2/2 OSA/OHS.  -see OHS above  Disposition: She is ready for discharge today. Her sleep study is tonight. She will be on home Lovenox until Friday at which time Dr. Alexandria Lodge will reevaluate in coumadin clinic. Follow-ups with [facility], [facility], and heart failure clinic all scheduled.   LOS: 4 days   This is a Psychologist, occupational Note.  The care of the patient was discussed with Dr. [name] and the assessment and plan formulated with their assistance.  Please see their attached note for official documentation of the daily encounter.  Mary [name] 03/29/2012, 9:33 AM

## 2012-03-29 NOTE — Plan of Care (Signed)
Problem: Phase III Progression Outcomes Goal: Dyspnea controlled with activity Outcome: Adequate for Discharge Pt will continue on 02 is dependant and has sleep study ordered on 03/29/12 uses CPAP @ night

## 2012-03-29 NOTE — Progress Notes (Signed)
pT READY FOR d/c WENT OVER ALL INFO INCLUDING HOME MEDS, FOLLOW UP VISITS, AND PRESCRIPTIONS. hAS HOME lOVENOX KIT. Friend brought home 02 for transfer/discharge. Aware of bleeding precautions. D/C per W/C per Nurse tech. T Risa Auman RN

## 2012-03-29 NOTE — Progress Notes (Addendum)
PGY1 Addendum I agree with excellent MS3 note above.  I have seen and examined patient with MS3. S:  The patient was net negative yesterday about 800 cc after switching from IV lasix to PO torsemide.  The patient notes significantly less swelling since admission.  O:  General: alert, cooperative, and in no apparent distress HEENT: pupils equal round and reactive to light, vision grossly intact, oropharynx clear and non-erythematous  Neck: supple, no lymphadenopathy Lungs: clear to ascultation bilaterally, normal work of respiration, no wheezes, rales, ronchi Heart: regular rate and rhythm, no murmurs, gallops, or rubs Abdomen: soft, mildly tender to palpation, large area of patchy erythema noted on patient's lower abdomen. Less distended today, no guarding or rebound tenderness  Extremities: trace non-pitting edema. RUE with trace non-pitting edema Neurologic: alert & oriented X3, cranial nerves II-XII intact, strength grossly intact, sensation intact to light touch  I have reviewed labs and imaging.  A/P: The patient is a 36 yo woman, history of R-sided CHF, pulm HTN, OHS, presenting with fluid overload.   # Edema - the patient noted LE, RUE and R breast edema, and abdominal ascites on prior hospitalization. Symptoms are thought to be due to vascular congestion secondary to R heart failure. RUE and R breast edema likely represents vascular congestion distal to R subclavian DVT.  -continue compression stockings  -discharge home on torsemide 20 BID -continue warfarin for DVT, with treatment dose lovenox   # Abdominal thrombophlebitis - area of erythema noted on abdomen. Area appears to be increasing in size to encompass entire lower abdomen. Differential includes thrombophlebitis vs contact dermatitis vs ?cellulitis (less likely given lack of fever or leukocytosis  -continue doxycycline for a 5-day course -topical hydrocortisone cream  -benadryl   # Chest Pain - the patient notes a 1-day  history of pleuritic right chest pain. Pain is likely secondary to known right middle lobe pulmonary branch vessel PE.. CXR and EKG yesterday showed no pericarditis.  LLL opacity on cxr likely represents atelectasis, as patient is afebrile with no leukocytosis. -no further treatment needed  # Obesity Hypoventilation syndrome - chronic, stable  -continue BiPap   # R subclavian DVT/PE - At last hospital admission, patient presented with INR = 4.34 on Warfarin 7.5 mg daily. This dose was decreased to 3.75 mg daily. Now patient presents with INR = 1.24.  -coumadin per pharmacy consult  -lovenox   # R heart failure - likely secondary to pulm HTN, due to OHS/COPD  -see above management of edema and OHS   # Prophy - warfarin, with lovenox while subtherapeutic  # Dispo - home today, with outpatient follow-up  Signed [name], PGY1 03/29/2012 10:31 AM

## 2012-03-29 NOTE — Discharge Summary (Addendum)
Internal Medicine Teaching [facility] Discharge Note  Name: Mary [name] MRN: [id number] DOB: 02-23-1976 36 y.o.  Date of Admission: 03/25/2012  6:15 PM Date of Discharge: 03/29/2012 Attending Physician: Blanch Media, MD  Discharge Diagnosis: 1. Acute on Chronic Systolic Right Heart Failure 2. Abdominal thrombophlebitis 3. Pleuritic chest pain - likely from known PE 4. Obesity Hypoventilation Syndrome 5. R subclavian DVT/PE 6. Right heart failure 7. Pulmonary hypertension  Discharge Medications: Medication List  As of 03/29/2012  5:58 PM   STOP taking these medications         furosemide 40 MG tablet      HYDROcodone-acetaminophen 10-325 MG per tablet         TAKE these medications         albuterol 108 (90 BASE) MCG/ACT inhaler   Commonly known as: PROVENTIL HFA;VENTOLIN HFA   Inhale 2 puffs into the lungs every 6 (six) hours as needed. For shortness of breath      doxycycline 100 MG tablet   Commonly known as: VIBRA-TABS   Take 1 tablet (100 mg total) by mouth every 12 (twelve) hours.      enoxaparin 80 MG/0.8ML injection   Commonly known as: LOVENOX   Inject 0.8 mLs (80 mg total) into the skin every 12 (twelve) hours.      Fluticasone-Salmeterol 250-50 MCG/DOSE Aepb   Commonly known as: ADVAIR   Inhale 1 puff into the lungs every 12 (twelve) hours.      HYDROcodone-acetaminophen 5-325 MG per tablet   Commonly known as: NORCO   Take 1 tablet by mouth every 6 (six) hours as needed for pain.      hydrocortisone cream 0.5 %   Apply 1 application topically 3 (three) times daily as needed.      ibuprofen 600 MG tablet   Commonly known as: ADVIL,MOTRIN   Take 600 mg by mouth every 6 (six) hours as needed. For pain      torsemide 20 MG tablet   Commonly known as: DEMADEX   Take 1 tablet (20 mg total) by mouth 2 (two) times daily.      warfarin 7.5 MG tablet   Commonly known as: COUMADIN   Take 0.5 tablets (3.75 mg total) by mouth daily. Take 1  tablet today (4/24), then 1/2 tablet daily            Disposition and follow-up:   Mary [name] was discharged from [facility] in stable and improved condition, with a decrease in lower extremity edema.  The patient will follow-up at the appointments listed below.  At the patient's follow-up with Dr. Scot Dock, we will check a BMET to assess her creatinine after starting PO torsemide, and to assess compliance with BiPap.   Follow-up Appointments: Discharge Orders    Future Appointments: Provider: Department: Dept Phone: Center:   03/29/2012 8:00 PM [facility] [place] [phone] [facility]   03/30/2012 2:45 PM Kalman Shan, MD [facility] [phone] None   03/31/2012 10:15 AM [facility] [phone] [facility]   03/31/2012 10:45 AM Zollie Beckers, MD [facility] [phone] [facility]   04/05/2012 10:00 AM [facility] [phone] None     Future Orders Please Complete By Expires   Diet - low sodium heart healthy      Increase activity slowly      Call MD for:  temperature >100.4      Call MD for:  difficulty breathing,  headache or visual disturbances      (HEART FAILURE PATIENTS) Call MD:  Anytime you have any of the following symptoms: 1) 3 pound weight gain in 24 hours or 5 pounds in 1 week 2) shortness of breath, with or without a dry hacking cough 3) swelling in the hands, feet or stomach 4) if you have to sleep on extra pillows at night in order to breathe.         Consultations: None  Procedures Performed:  Dg Chest 2 View  03/28/2012  *RADIOLOGY REPORT*  Clinical Data: Right chest pain  CHEST - 2 VIEW  Comparison: 03/26/2012  Findings: Mild left basilar opacity, atelectasis versus pneumonia. Increased interstitial markings without frank interstitial edema. No pleural effusion or pneumothorax.  The heart is normal in size.  Mild degenerative changes of the visualized thoracolumbar spine.   IMPRESSION: Mild left basilar opacity, atelectasis versus pneumonia.  Original Report Authenticated By: Charline Bills, M.D.   X-ray Chest Pa And Lateral   03/26/2012  *RADIOLOGY REPORT*  Clinical Data: CHF.  Cellulitis.  Swelling.  History of pulmonary hypertension, COPD.  History of smoking.  CHEST - 2 VIEW  Comparison: 03/25/2012  Findings: Heart is enlarged.  There is mild prompts interstitial markings.  Question of early infiltrate at the left lung base. Small bilateral pleural effusions.  IMPRESSION:  1.  Prominent interstitial markings and bronchitic changes. 2.  Question early infiltrate left lung base.  Original Report Authenticated By: Patterson Hammersmith, M.D.   Dg Chest 2 View  03/20/2012  *RADIOLOGY REPORT*  Clinical Data: History of shortness of breath and weakness.  CHEST - 2 VIEW  Comparison: 01/24/2012.  03/06/2012.  03/19/2012.  CT 03/06/2012.  Findings: There is stable mild enlargement of the cardiac silhouette.  There is enlargement of the main pulmonary artery segment.  Hazy opacity in the right base has decreased since previous study. There is blunting of the right lateral costophrenic angle and both posterior costophrenic angles consistent with small pleural effusions.  Minimal atelectasis is seen.  No consolidation is evident. Bones appear average for age.  IMPRESSION: Stable mild enlargement cardiac silhouette.  Enlargement of main pulmonary artery segment.  Decrease haziness in right base since previous study.  Small bilateral pleural effusions.  Minimal basilar atelectasis.  Original Report Authenticated By: Crawford Givens, M.D.   Dg Chest 2 View  03/06/2012  *RADIOLOGY REPORT*  Clinical Data: Right-sided chest pain.  Swelling.  CHEST - 2 VIEW  Comparison: 01/24/2012.  Findings: Peribronchial thickening may represent changes of chronic bronchitis.  Basilar scarring.  No segmental infiltrate or pneumothorax.  Prominence right hilar region.  This may represent overlapping vascular  structures however, adenopathy or mass not excluded.  Heart size top normal.  IMPRESSION: Peribronchial thickening may represent changes of chronic bronchitis.  Prominence right hilar region.  This may represent overlapping vascular structures however, adenopathy or mass not excluded.  Original Report Authenticated By: Fuller Canada, M.D.   Ct Angio Chest W/cm &/or Wo Cm  03/07/2012  **ADDENDUM** CREATED: 03/07/2012 15:35:11  Per discussion with Dr. Bard Herbert, the patient had a right heart cath which  demonstrated elevated right heart pressures which corresponds to the enlarged right atrium and right ventricle and right main pulmonary artery and most likely is responsible for the ascites and third spacing of fluid.  With regard to the possibility pulmonary embolus, the present examination is limited by respiratory motion. Separate from the motion artifact is suggestion of a tiny filling defect within right  middle lobe pulmonary branch vessel (series 6 images 105 through 108, series 602 image 31 and is series 603 image 43).  Chronic lung changes may also contribute to the elevated right heart pressures.  **END ADDENDUM** SIGNED BY: Almedia Balls. Constance Goltz, M.D.    03/06/2012  **ADDENDUM** CREATED: 03/06/2012 10:02:30  Contrast utilized was 100 ml of Omnipaque 350.  **END ADDENDUM** SIGNED BY: Almedia Balls. Constance Goltz, M.D.    03/06/2012  *RADIOLOGY REPORT*  Clinical Data: Right upper extremity swelling.  Question abnormal chest x-ray.  CT ANGIOGRAPHY CHEST  Technique:  Multidetector CT imaging of the chest using the standard protocol during bolus administration of intravenous contrast. Multiplanar reconstructed images including MIPs were obtained and reviewed to evaluate the vascular anatomy.  Contrast:  Comparison: 03/06/2012 chest x-ray.  Findings: Filling defect right internal jugular vein/subclavian vein junction just proximal to the formation of the superior vena cava involving the right subclavian vein.  Marked  right chest wall  edema involving the right breast and right axilla.  Right axillary adenopathy.  No pulmonary embolus (probable motion artifact involving the right lower lobe pulmonary vessels).  No aortic dissection.  Mediastinal and right hilar adenopathy.  Scattered pulmonary parenchymal changes without dominant worrisome mass.  Patchy parenchymal changes most notable right upper lobe. This can be reevaluated follow-up after acute episode has cleared.  Small right-sided pleural effusion.  Right heart enlargement. Small superiorly located pericardial effusion.  No bony destructive lesion.  Ascites.  Slight lobularity liver without other findings of cirrhosis.  Mild third spacing of fluid abdominal wall.  IMPRESSION: Thrombosed right subclavian vein.  Marked  right chest wall edema involving the right breast and right axilla.  Right axillary adenopathy.  No pulmonary embolus (probable motion artifact involving the right lower lobe pulmonary vessels).  Mediastinal and right hilar adenopathy.  Scattered pulmonary parenchymal changes without dominant worrisome mass.  Patchy parenchymal changes most notable right upper lobe. This can be reevaluated follow-up after acute episode has cleared.  Small right-sided pleural effusion.  Right heart enlargement. Small superiorly located pericardial effusion.  Ascites.  Slight lobularity liver without other findings of cirrhosis.  Mild third spacing of fluid abdominal wall.  Original Report Authenticated By: Fuller Canada, M.D.   Ct Abdomen Pelvis W Contrast  03/07/2012  *RADIOLOGY REPORT*  Clinical Data: Right-sided edema and adenopathy  CT ABDOMEN AND PELVIS WITH CONTRAST  Technique:  Multidetector CT imaging of the abdomen and pelvis was performed following the standard protocol during bolus administration of intravenous contrast.  Contrast:  80 ml of omni 300  Comparison: None.  Findings:  There are small bilateral pleural effusions, right greater than left.  The mild interlobular septal  thickening is noted consistent with mild interstitial edema.  There is diffuse body wall edema identified.  Focal area of low density adjacent to the falciform ligament is identified unlike lie represents focal fatty deposition.  There is mild edema of the gallbladder wall.  Non-specific in a hypoproteinemic and volume overload state.  No biliary dilatation.  The pancreas appears normal.  The spleen is negative.  Both adrenal glands are normal.  The kidneys are both unremarkable.  There are multiple upper abdominal lymph nodes but no evidence for adenopathy.  There is no pelvic or inguinal adenopathy. Intermediate density fluid within the urinary bladder is present.  This may reflect excreted contrast material from recent chest CT.  There is no pelvic or inguinal adenopathy.  The stomach and the small bowel loops appear normal.  The  appendix is identified and is normal.  The colon is negative.  Moderate ascites identified within the abdomen and pelvis.  IMPRESSION:  1.  Findings compatible with fluid overload state with ascites, body wall edema, and pleural effusions. 2.  No mass or adenopathy identified.  Original Report Authenticated By: Rosealee Albee, M.D.   Dg Chest Port 1 View  03/25/2012  *RADIOLOGY REPORT*  Clinical Data: Shortness of breath, COPD no CHF  PORTABLE CHEST - 1 VIEW  Comparison: 03/20/2012  Findings: Mild patchy left basilar opacity, possibly atelectasis. Lungs are otherwise clear.  Possible small left pleural effusion. No pneumothorax.  Heart is top normal in size.  IMPRESSION: Mild patchy left basilar opacity, possibly atelectasis, with possible small left pleural effusion with  Original Report Authenticated By: Charline Bills, M.D.   Dg Chest Port 1 View  03/19/2012  *RADIOLOGY REPORT*  Clinical Data: Shortness of breath.  PORTABLE CHEST - 1 VIEW  Comparison: Chest radiograph and CTA of the chest performed 03/06/2012  Findings: The lungs are well-aerated.  On the prior study, note  was made of mild patchy airspace opacity, which could have reflected mild pneumonia or possibly atelectasis; this is slightly more apparent at the lung bases on the current study.  There is no evidence of pleural effusion or pneumothorax.  The cardiomediastinal silhouette is within normal limits.  No acute osseous abnormalities are seen.  IMPRESSION: On the prior CTA, note was made of mild patchy airspace opacity, which could have reflected mild pneumonia; this is slightly more apparent at the lung bases on the current study, raising concern for persistent mild pneumonia.  Would complete treatment for pneumonia, then consider performing follow-up CT in several weeks as previously suggested, to ensure resolution of vague right upper lobe airspace opacity.  Original Report Authenticated By: Tonia Ghent, M.D.   Admission HPI:  Mary [name] is a 36 year old female with PMH significant for recent hospital admission for acute respiratory failure and right heart failure as well as hx of heavy alcohol abuse with cirrhosis,OSA, OHS, COPD, pulmonary hypertension, DVT/PE who presents with worsening of abdominal and leg swelling.  Patient was just discharged home on 03/22/12 after treatment of Acute respiratory failure 2/2 alcohol abuse, opioid use and underlying cardio-pulmonary disease. She was also treated for right heart failure/ascites and discharged home with Lasix 40 mg po daily.  She had follow up visit on the very next day after her discharge and was evaluated by her cardiologist Dr. Jens Som who indicated that she was volume overload. Patient was instructed to take an additional 80 mg of Lasix on 03/22/12, 80 mg twice a day 4/18 and then increase to 40 mg twice a day afterwards. However, Patient states that she only took lasix 40 mg po BID. Patient reports that she noticed progressive worsening of her abdominal and leg swellings. She described that her leg swelling started at her ankles and gradually went up  to her knees. She noticed that her abdomen and right breast were getting bigger as well. She reports that her SOB has been at her baseline except for orthopnea last night which resolved as soon as she sat up. Denies chest pain, chest pressure or palpitation. Denies fever, chills or night sweats. Denies sick contact or recent travel.  Today, when her [facility] RN visited her, she was noted to have redness and tenderness on her lower abdomen and right breast.  Her HH RN examined her and called Dr. Prescott Gum PA who recommended her to go to ED  for further evaluation.  Of note, patient was also admitted on 03/06/12 with right facial, right upper extremity and breast swelling and was found to have a right subclavian DVT and small right middle lobe PE of pulmonary branch vessel on CTA. She has been on coumadin since.   Admission Physical Exam Blood pressure 119/58, pulse 86, temperature 98.5 F (36.9 C), temperature source Oral, resp. rate 20, height 5\' 2"  (1.575 m), weight 201 lb (91.173 kg), last menstrual period 01/28/2012, SpO2 96.00%.  No significant shortness of breath. She is breathing nasal oxygen  General: alert, well-developed, and cooperative to examination.  Head: normocephalic and atraumatic.  Eyes: vision grossly intact, pupils equal, pupils round, pupils reactive to light, no injection and anicteric.  Mouth: pharynx pink and moist, no erythema, and no exudates.  Neck: supple, full ROM, no thyromegaly, JVD + 6, and no carotid bruits.  Lungs: normal respiratory effort, no accessory muscle use, B/L lung sounds diminished. No rales or wheezing noted.  Right breast erythema, swelling, warm to touch, and tenderness to palpation noted.  Heart: normal rate, regular rhythm, no murmur, no gallop, and no rub.  Abdomen: soft,normal bowel sounds, no distention, no guarding, no rebound tenderness  Abdomen distension and wall swelling noted. Generalized erythema, tenderness and warm to touch noted at lower to  mid abdomen.  Msk: no joint swelling, no joint warmth, and no redness over joints.  Pulses: 2+ DP/PT pulses bilaterally Extremities: No cyanosis, clubbing. B/L LE 3-4+ pitting edema up to the knees. Neurologic: alert & oriented X3, cranial nerves II-XII intact, strength normal in all extremities, sensation intact to light touch, and gait normal.  Skin: turgor normal and no rashes.  Psych: Oriented X3, memory intact for recent and remote, normally interactive, good eye contact, not anxious appearing, and not depressed appearing.   Admission Labs Basic Metabolic Panel:  [facility]  03/25/12 1920   NA  138   K  4.0   CL  90*   CO2  41*   GLUCOSE  81   BUN  8   CREATININE  0.55   CALCIUM  9.3   MG  --   PHOS  --    Liver Function Tests:  [facility]  03/25/12 1920   AST  25   ALT  20   ALKPHOS  71   BILITOT  0.7   PROT  6.7   ALBUMIN  3.4*    CBC:  [facility]  03/25/12 1920   WBC  9.7   NEUTROABS  6.6   HGB  15.1*   HCT  48.4*   MCV  95.1   PLT  178    BNP:  Basename  03/25/12 1920   PROBNP  937.0*     Hospital Course by problem list: 1. Acute on Chronic Systolic Right Heart Failure - the patient presented with a 2-lb weight gain in 1 day, and significant bilateral LE, RUE, R breast, and abdominal edema, after starting lasix 40 daily at last hospital discharge, and despite increasing to lasix 40 BID per instructions by Dr. Jens Som.  The patient has a history of CHF, with pulmonary hypertension and right heart failure.  The patient was diuresed with IV lasix 40 mg BID for 2 days, then switched to Torsemide 20 BID, with a net negative fluid loss of about 4.3 liters by I's/O's.  Interestingly, daily weights revealed a decrease from 201 pounds to 179 pounds, which appears inconsistent with the amount of fluid loss, though this measurement was  repeated several times.  The patient was also started on compression stockings, which significantly improved her LE edema.  She was provided  with a scale to use at home, and educated about the warning signs of a CHF exacerbation  The patient will follow-up with the [facility] Failure [facility] on 04/05/12.  2. Abdominal thrombophlebitis - the patient presented with an area of abdominal erythema, warmth, and itching, of unclear etiology.  The area likely represented superficial thrombophlebitis vs contact dermatitis.  She noted no signs of fever or tachycardia to suggest infection, but cellulitis is another possibility. She was treated with hydrocortisone cream, with symptomatic relief, and will be discharged on a 5-day course of doxycycline out of concern for possible cellulitis.  3. Pleuritic chest pain - the patient noted a 1-day history of mild pleuritic right mid chest pain, which developed during hospitalization.  The patient has a known right middle lobe pulmonary branch vessel PE, likely causing the patient's pain.  CXR and EKG showed no other acute abnormality to explain the patient's pain.  4. Obesity Hypoventilation Syndrome - the patient has a history of obesity hypoventilation syndrome, as diagnosed during her last hospital admission with an ABG showing hypercapnea at rest.  The patient will be discharged home with BiPap, to follow-up with a sleep study on the night of 4/24 for further titration of BiPap.  5. R subclavian DVT/PE - the patient has a history of a right subclavian thrombosis on 03/06/12.  The patient presented with a subtherapeutic INR, after decreasing her coumadin dose at last hospital discharge to 3.75 mg/day (due to a supratherapeutic INR at that time).  The patient was re-bridged with lovenox, and demonstrated ability to inject herself with lovenox.  The patient will be continued on coumadin, and will follow-up in the coumadin clinic.  6. Right heart failure/Pulmonary hypertension - please see discharge summary from 4/6 for full details.  Time spent on discharge: 45 minutes  Discharge Vitals:  BP 113/70  Pulse 78   Temp(Src) 97.4 F (36.3 C) (Oral)  Resp 18  Ht 5\' 2"  (1.575 m)  Wt 179 lb 10.8 oz (81.5 kg)  BMI 32.86 kg/m2  SpO2 97%  LMP 01/28/2012  Discharge Labs:  Results for orders placed during the hospital encounter of 03/25/12 (from the past 24 hour(s))  PROTIME-INR     Status: Abnormal   Collection Time   03/29/12  4:30 AM      Component Value Range   Prothrombin Time 16.2 (*) 11.6 - 15.2 (seconds)   INR 1.27  0.00 - 1.49   BASIC METABOLIC PANEL     Status: Abnormal   Collection Time   03/29/12  4:30 AM      Component Value Range   Sodium 138  135 - 145 (mEq/L)   Potassium 3.8  3.5 - 5.1 (mEq/L)   Chloride 93 (*) 96 - 112 (mEq/L)   CO2 37 (*) 19 - 32 (mEq/L)   Glucose, Bld 80  70 - 99 (mg/dL)   BUN 13  6 - 23 (mg/dL)   Creatinine, Ser 1.61  0.50 - 1.10 (mg/dL)   Calcium 9.4  8.4 - 09.6 (mg/dL)   GFR calc non Af Amer >90  >90 (mL/min)   GFR calc Af Amer >90  >90 (mL/min)  CBC     Status: Abnormal   Collection Time   03/29/12  4:30 AM      Component Value Range   WBC 8.0  4.0 - 10.5 (K/uL)  RBC 5.05  3.87 - 5.11 (MIL/uL)   Hemoglobin 14.7  12.0 - 15.0 (g/dL)   HCT 16.1 (*) 09.6 - 46.0 (%)   MCV 95.2  78.0 - 100.0 (fL)   MCH 29.1  26.0 - 34.0 (pg)   MCHC 30.6  30.0 - 36.0 (g/dL)   RDW 04.5  40.9 - 81.1 (%)   Platelets 165  150 - 400 (K/uL)    Signed: [name] 03/29/2012, 5:58 PM

## 2012-03-29 NOTE — Plan of Care (Signed)
Problem: Phase I Progression Outcomes Goal: EF % per last Echo/documented,Core Reminder form on chart Outcome: Completed/Met Date Met:  03/29/12 EF 55%-60% from 03/06/12

## 2012-03-29 NOTE — Progress Notes (Signed)
   CARE MANAGEMENT NOTE HEART FAILURE  03/29/2012   Patient:  Mary [name], Mary [name]   Account Number:  [id number]    Date Initiated:  03/27/2012  Documentation initiated by:  Tera Mater  Subjective/Objective Assessment:   36yo female admitted with SOB, abdominal swelling, and LLE. HX: CHF, COPD, Cirrohsis, CAD.  Pt. lives with spouse.   Action/Plan:   Discharge planning for [facility] RN for HF managmenet   Anticipated DC Date:  03/30/2012  Anticipated DC Plan:  HOME W HOME HEALTH SERVICES  DC Planning Services:  CM consult    PAC Choice:  Resumption Of Svcs/PTA Provider   Choice offered to / List presented to:         [facility] agency:  [facility]    Status of service:  In process, will continue to follow  Medicare Important Message Given:  NO (If response is "NO", the following Medicare IM given date fields will be blank) Date Medicare IM Given:   Date Additional Medicare IM Given:    Discharge Disposition:  HOME W HOME HEALTH SERVICES  Per UR Regulation:  Reviewed for med. necessity/level of care/duration of stay  If discussed at Long Length of Stay Meetings, dates discussed:    Comments:   03/29/12 1324 Pt. discharged home with [facility] services. Tera Mater, RN, BSN Nurse Case Manager [phone]   03/28/12 1400 Gave pt. a weigh scale from the [facility] program here at [facility]. Tera Mater, RN, BSN Nurse Case Manager [phone]   03/28/12 1145 Spoke with pt. about discharge planning and HF education.  Pt. did have Living Better with Heart Failure booklet and was very well knowledgeable of material.  Pt. was interested in having a weigh scale. Tera Mater, RN, BSN Nurse Case Manager [phone]   03/27/12 1500 UR Completed Tera Mater, RN, BSN Nurse Case Manager [phone]   Initial CM contact:  03/28/2012 11:45 AM  By:  Tera Mater Initial CSW contact:     By:      Is this an INP Readmission < 30 days:  Y (If "YES" please see readmission  information at the bottom of note)  Patient living status prior to this admission:  FAMILY  Patient setting prior to this admission:  HOME  Comorbid conditions being treated that contributed to this admission:  CHF, COPD, CIRROHSIS  CHF Readmission Risk:  high  Type of patient education provided  Discharge Instructions  HF Patient Education Assessment / Teach Back  HF Zone Tool / Magnet  Limit salt intake  Weigh daily     Patient education provided by  [facility]    Was referral made to [facility]:  N  Is the patient's PCP the same as attending:  N PCP:    Readmission < 30 Days If pt has HH, did they contact the agency before going to the ED:   Name of [facility] agency:    Was the follow-up physician visit scheduled prior to discharge:  Y  Did the patient follow-up with the physician prior to this readmission:  Y  Was there HF Clinic visits prior to readmission:  N  Were there ED visits between admissions:  N  Readmit type:  Unscheduled/Unrelated  If unscheduled and related indicate reason for readmit:    Contributing reason(s) for readmission  Return of Symptoms

## 2012-03-29 NOTE — Progress Notes (Signed)
RT Note: pt placed herself on CPAP.

## 2012-03-29 NOTE — Progress Notes (Signed)
Reinforced teaching of injecting Lovenox at home upon discharge.  Discussed the steps of performing the injection.  Patient performed Lovenox injection at appropriate site with appropriate technique.  She activated the safety after the injection was given and verbalized proper discussion of disposing the Lovenox needle.  Patient will need to perform further demonstrations to nursing staff while she is here to become confident in performing the injection.  Idolina Primer RN

## 2012-03-29 NOTE — Discharge Instructions (Signed)
During your hospital stay you were treated for edema in areas of your body including your legs, arms, and right breast. You were given Lasix intravenously to shed off the extra fluid that your body had accumulated. Daily weights were measured, daily intake and out-take as well as kidney function tests were all followed closely to ensure the best possible treatment for your edema. You were then started on torsemide tablets to take by mouth twice a day that you have responded well to and will continue this medication at home.  The heart failure clinic will follow up with you periodically and teach you ways to monitor your weight daily and can adjust your medication to help keep the edema from building up in the future.  #1) Edema: You responded very well to intravenous lasix and are going home on torsemide tablets (a diuretic) to continue helping you get rid of any extra fluid in your body.  #2) Breathing: You use your BiPAP at night to help your lungs get the right amount of oxygen they require and allows your heart to work effectively.  Heart Failure Heart failure (HF) is a condition in which the heart has trouble pumping blood. This means your heart does not pump blood efficiently for your body to work well. In some cases of HF, fluid may back up into your lungs or you may have swelling (edema) in your lower legs. HF is a long-term (chronic) condition. It is important for you to take good care of yourself and follow your caregiver's treatment plan. CAUSES   Health conditions:   High blood pressure (hypertension) causes the heart muscle to work harder than normal. When pressure in the blood vessels is high, the heart needs to pump (contract) with more force in order to circulate blood throughout the body. High blood pressure eventually causes the heart to become stiff and weak.   Coronary artery disease (CAD) is the buildup of cholesterol and fat (plaques) in the arteries of the heart. The blockage in  the arteries deprives the heart muscle of oxygen and blood. This can cause chest pain and may lead to a heart attack. High blood pressure can also contribute to CAD.   Heart attack (myocardial infarction) occurs when 1 or more arteries in the heart become blocked. The loss of oxygen damages the muscle tissue of the heart. When this happens, part of the heart muscle dies. The injured tissue does not contract as well and weakens the heart's ability to pump blood.   Abnormal heart valves can cause HF when the heart valves do not open and close properly. This makes the heart muscle pump harder to keep the blood flowing.   Heart muscle disease (cardiomyopathy or myocarditis) is damage to the heart muscle from a variety of causes. These can include drug or alcohol abuse, infections, or unknown reasons. These can increase the risk of HF.   Lung disease makes the heart work harder because the lungs do not work properly. This can cause a strain on the heart leading it to fail.   Diabetes increases the risk of HF. High blood sugar contributes to high fat (lipid) levels in the blood. Diabetes can also cause slow damage to tiny blood vessels that carry important nutrients to the heart muscle. When the heart does not get enough oxygen and food, it can cause the heart to become weak and stiff. This leads to a heart that does not contract efficiently.   Other diseases can contribute to HF. These  include abnormal heart rhythms, thyroid problems, and low blood counts (anemia).   Unhealthy lifestyle habits:   Obesity.   Smoking.   Eating foods high in fat and cholesterol.   Eating or drinking beverages high in salt.   Drug or alcohol abuse.   Lack of exercise.  SYMPTOMS  HF symptoms may vary and can be hard to detect. Symptoms may include:  Shortness of breath with activity, such as climbing stairs.   Persistent cough.   Swelling of the feet, ankles, legs, or abdomen.   Unexplained weight gain.    Difficulty breathing when lying flat.   Waking from sleep because of the need to sit up and get more air.   Rapid heartbeat.   Fatigue and loss of energy.   Feeling lightheaded or close to fainting.  DIAGNOSIS  A diagnosis of HF is based on your history, symptoms, physical examination, and diagnostic tests. Diagnostic tests for HF may include:  EKG.   Chest X-ray.   Blood tests.   Exercise stress test.   Blood oxygen test (arterial blood gas).   Evaluation by a heart doctor (cardiologist).   Ultrasound evaluation of the heart (echocardiogram).   Heart artery test to look for blockages (angiogram).   Radioactive imaging to look at the heart (radionuclide test).  TREATMENT  Treatment is aimed at managing the symptoms of HF. Medicines, lifestyle changes, or surgical intervention may be necessary to treat HF.  Medicines to help treat HF may include:   Angiotensin-converting enzyme (ACE) inhibitors. These block the effects of a blood protein called angiotensin-converting enzyme. ACE inhibitors relax (dilate) the blood vessels and help lower blood pressure. This decreases the workload of the heart, slows the progression of HF, and improves symptoms.   Angiotensin receptor blockers (ARBs). These medications work similar to ACE inhibitors. ARBs may be an alternative for people who cannot tolerate an ACE inhibitor.   Aldosterone antagonists. This medication helps get rid of extra fluid from your body. This lowers the volume of blood the heart has to pump.   Water pills (diuretics). Diuretics cause the kidneys to remove salt and water from the blood. The extra fluid is removed by urination. By removing extra fluid from the body, diuretics help lower the workload of the heart and help prevent fluid buildup in the lungs so breathing is easier.   Beta blockers. These prevent the heart from beating too fast and improve heart muscle strength. Beta blockers help maintain a normal heart  rate, control blood pressure, and improve HF symptoms.   Digitalis. This increases the force of the heartbeat and may be helpful to people with HF or heart rhythm problems.   Healthy lifestyle changes include:   Stopping smoking.   Eating a healthy diet. Avoid foods high in fat. Avoid foods fried in oil or made with fat. A dietician can help with healthy food choices.   Limiting how much salt you eat.   Limiting alcohol intake to no more than 1 drink per day for women and 2 drinks per day for men. Drinking more than that is harmful to your heart. If your heart has already been damaged by alcohol or you have severe HF, drinking alcohol should be stopped completely.   Exercising as directed by your caregiver.   Surgical treatment for HF may include:   Procedures to open blocked arteries, repair damaged heart valves, or remove damaged heart muscle tissue.   A pacemaker to help heart muscle function and to control certain  abnormal heart rhythms.   A defibrillator to possibly prevent sudden cardiac death.  HOME CARE INSTRUCTIONS   Activity level. Your caregiver can help you determine what type of exercise program may be helpful. It is important to maintain your strength. Pace your physical activity to avoid shortness of breath or chest pain. Rest for 1 hour before and after meals. A cardiac rehabilitation program may be helpful to some people with HF.   Diet. Eat a heart healthy diet. Food choices should be low in saturated fat and cholesterol. Talk to a dietician to learn about heart healthy foods.   Salt intake. When you have HF, you need to limit the amount of salt you eat. Eat less than 1500 milligrams (mg) of salt per day or as recommended by your caregiver.   Weight monitoring. Weigh yourself every day. You should weigh yourself in the morning after you urinate and before you eat breakfast. Wear the same amount of clothing each time you weigh yourself. Record your weight daily. Bring  your recorded weights to your clinic visits. Tell your caregiver right away if you have gained 3 lb/1.4 kg in 1 day, or 5 lb/2.3 kg in a week or whatever amount you were told to report.   Blood pressure monitoring. This should be done as directed by your caregiver. A home blood pressure cuff can be purchased at a drugstore. Record your blood pressure numbers and bring them to your clinic visits. Tell your caregiver if you become dizzy or lightheaded upon standing up.   Smoking. If you are currently a smoker, it is time to quit. Nicotine makes your heart work harder by causing your blood vessels to constrict. Do not use nicotine gum or patches before talking to your caregiver.   Follow up. Be sure to schedule a follow-up visit with your caregiver. Keep all your appointments.  SEEK MEDICAL CARE IF:   Your weight increases by 3 lb/1.4 kg in 1 day or 5 lb/2.3 kg in a week.   You notice increasing shortness of breath that is unusual for you. This may happen during rest, sleep, or with activity.   You cough more than normal, especially with physical activity.   You notice more swelling in your hands, feet, ankles, or belly (abdomen).   You are unable to sleep because it is hard to breathe.   You cough up bloody mucus (sputum).   You begin to feel "jumping" or "fluttering" sensations (palpitations) in your chest.  SEEK IMMEDIATE MEDICAL CARE IF:   You have severe chest pain or pressure which may include symptoms such as:   Pain or pressure in the arms, neck, jaw, or back.   Feeling sweaty.   Feeling sick to your stomach (nauseous).   Feeling short of breath while at rest.   Having a fast or irregular heartbeat.   You experience stroke symptoms. These symptoms include:   Facial weakness or numbness.   Weakness or numbness in an arm, leg, or on one side of your body.   Blurred vision.   Difficulty talking or thinking.   Dizziness or fainting.   Severe headache.  THESE ARE  MEDICAL EMERGENCIES. Do not wait to see if the symptoms go away. Call your local emergency services (911 in [place] DO NOT drive yourself to the hospital. IMPORTANT  Make a list of every medicine, vitamin, or herbal supplement you are taking. Keep the list with you at all times. Show it to your caregiver at every visit. Keep  the list up-to-date.   Ask your caregiver or pharmacist to write an explanation of each medicine you are taking. This should include:   Why you are taking it.   The possible side effects.   The best time of day to take it.   Foods to take with it or what foods to avoid.   When to stop taking it.  MAKE SURE YOU:   Understand these instructions.   Will watch your condition.   Will get help right away if you are not doing well or get worse.  Document Released: 11/22/2005 Document Revised: 11/11/2011 Document Reviewed: 03/06/2010 [facility] Patient Information 2012 [place], [place].

## 2012-03-29 NOTE — Plan of Care (Signed)
Problem: Discharge Progression Outcomes Goal: Other Discharge Outcomes/Goals Outcome: Completed/Met Date Met:  03/29/12 Pt has been instructed for home injection of Lovenox. Administered own injection this am and did well with technique. Marisa Cyphers RN

## 2012-03-30 ENCOUNTER — Encounter: Payer: Self-pay | Admitting: Internal Medicine

## 2012-03-30 ENCOUNTER — Ambulatory Visit (INDEPENDENT_AMBULATORY_CARE_PROVIDER_SITE_OTHER): Payer: 59 | Admitting: Internal Medicine

## 2012-03-30 VITALS — BP 102/80 | HR 87 | Temp 97.3°F | Ht 62.0 in | Wt 176.8 lb

## 2012-03-30 DIAGNOSIS — J449 Chronic obstructive pulmonary disease, unspecified: Secondary | ICD-10-CM

## 2012-03-30 DIAGNOSIS — I2789 Other specified pulmonary heart diseases: Secondary | ICD-10-CM

## 2012-03-30 DIAGNOSIS — I272 Pulmonary hypertension, unspecified: Secondary | ICD-10-CM

## 2012-03-30 NOTE — Patient Instructions (Signed)
Continue your medications and oxygen I am going to review your chart and get back to you; will need few to several days Nurse will call the lab and get your breathing test and pft results

## 2012-03-30 NOTE — Progress Notes (Signed)
Subjective:    Patient ID: Mary [name], female    DOB: 03-May-1976, 36 y.o.   MRN: [id number]  HPI  IOV 03/30/2012  36 year old female referred by Residency program for supposed copd. Poor historian. Body mass index is 32.34 kg/(m^2).  Her dad did have copd. She smoked from age 50 to 11 and has 25 pack year smoking hx.    She reports Congenital heart surgery -  Nos. Parents deceased and she has no idea what surgeries she had. 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 Then starting march 2013, decompensation  -several admits, copd and pulm htn diagnosed and DVT UE  - on coumadin. Chart review shows ICU admits and being on bipap. On oxygen and diuresis since 1 month. On nocturnal cpap/bipap x 2 weeks.   Sleep study last night  - result pending. Does not know who her sleep doc is. [facility] appt pending 04/05/12  Referred because inpatient pft reportedly showed PFT  Data so far (reivewed after she left office)  - PFTS 03/08/12: fev1 0.58L/1%, FVC 1.18/33%, Ratop 49 and c/w ssevere obstruction. 21% BD response on FVC, RV 219%, DLCO 11/54%  - Duple UE 03/06/12  -  Summary: Findings consistent with acute deep vein thrombosis involving the right upper extremity of the right subclaviian vein   - CT chest 03/06/12  IMPRESSION:  Thrombosed right subclavian vein.  Marked right chest wall edema involving the right breast and right  axilla. Right axillary adenopathy.  No pulmonary embolus (probable motion artifact involving the right  lower lobe pulmonary vessels).  Mediastinal and right hilar adenopathy.  Scattered pulmonary parenchymal changes without dominant worrisome  mass. Patchy parenchymal changes most notable right upper lobe.  This can be reevaluated follow-up after acute episode has cleared.  Small right-sided pleural effusion. Right heart enlargement. Small  superiorly  located pericardial effusion.  Ascites. Slight lobularity liver without other findings of  cirrhosis. Mild third spacing of fluid abdominal wall.  Original Report Authenticated By: Fuller Canada, M.   ECHO 03/06/12:  - Left ventricle: Systolic function was normal. The estimated ejection fraction was in the range of 55% to 60%. - Ventricular septum: Septal motion showed paradox. - Right ventricle: The cavity size was moderately dilated. Systolic function was reduced. - Right atrium: The atrium was moderately dilated  Right Heart cath 03/07/12  Hemodynamic Findings:  Ao: (cuff) 111/84  LV: not performed  RA: 23  RV: 59/12/25  PA: 64/45 (mean 53)  PCWP: 18  Fick Cardiac Output: 5.0 L/min  Fick Cardiac Index: 2.76 L/min/m2  Peripheral Aortic Saturation: 90%  Pulmonary Artery Saturation: 67%    HIV 03/22/12  - NEGATIVE  ABG 03/22/12  7.4/70s/70s  SLEEP STUDY 03/29/12  Dr Maple Hudson   IMPRESSIONS-RECOMMENDATION:  1. Sleep architecture shows predominance of stage II sleep interrupted  mostly by rapid eye movement in a pattern that suggests sedating  medication during the sleep period, although the none is listed.  2. Occasional respiratory events with sleep disturbance, within normal  limits. Apnea-hypopnea index 4.6 per hour (the normal range for  adults is from 0-5 events per hour). Moderate snoring with oxygen  desaturation to a nadir of 73% and mean oxygen saturation through  the study of 93% wearing supplemental oxygen.  3. The patient wore nasal oxygen at 4 L per minute throughout  the  study as ordered. On arrival while upright, saturation was 97%.  During this study, she desaturated to a nadir of 73% with a mean  throughout the study of 93% saturation while wearing oxygen at 4 L  per minute by nasal prongs. During the total recording time,  oxygen saturation was less than 90% for 51.3 minutes and less than  88% for 38.9 minutes while wearing supplemental oxygen.     AUTOIMMUNE  - alpha 1 03/07/12 - (done during illness_ 342  - ANA, RF, - April 2013 - NEGATIVE  MEDS  Current outpatient prescriptions:albuterol (PROVENTIL HFA;VENTOLIN HFA) 108 (90 BASE) MCG/ACT inhaler, Inhale 2 puffs into the lungs every 6 (six) hours as needed. For shortness of breath, Disp: , Rfl: ;  enoxaparin (LOVENOX) 80 MG/0.8ML injection, Inject 0.8 mLs (80 mg total) into the skin every 12 (twelve) hours., Disp: 4 Syringe, Rfl: 0 Fluticasone-Salmeterol (ADVAIR) 250-50 MCG/DOSE AEPB, Inhale 1 puff into the lungs every 12 (twelve) hours., Disp: , Rfl: ;  HYDROcodone-acetaminophen (NORCO) 5-325 MG per tablet, Take 1 tablet by mouth every 6 (six) hours as needed for pain., Disp: 30 tablet, Rfl: 0;  hydrocortisone cream 0.5 %, Apply 1 application topically 3 (three) times daily as needed., Disp: 30 g, Rfl: 0 torsemide (DEMADEX) 20 MG tablet, Take 1 tablet (20 mg total) by mouth 2 (two) times daily., Disp: 60 tablet, Rfl: 0;  warfarin (COUMADIN) 7.5 MG tablet, Take 0.5 tablets (3.75 mg total) by mouth daily. Take 1 tablet today (4/24), then 1/2 tablet daily, Disp: 30 tablet, Rfl: 0;  ibuprofen (ADVIL,MOTRIN) 600 MG tablet, Take 600 mg by mouth every 6 (six) hours as needed. For pain, Disp: , Rfl:    Review of Systems  Constitutional: Negative for fever and unexpected weight change.  HENT: Negative for ear pain, nosebleeds, congestion, sore throat, rhinorrhea, sneezing, trouble swallowing, dental problem, postnasal drip and sinus pressure.   Eyes: Negative for redness and itching.  Respiratory: Positive for shortness of breath. Negative for cough, chest tightness and wheezing.   Cardiovascular: Positive for chest pain. Negative for palpitations and leg swelling.  Gastrointestinal: Negative for nausea and vomiting.  Genitourinary: Negative for dysuria.  Musculoskeletal: Negative for joint swelling.  Skin: Negative for rash.  Neurological: Positive for headaches.  Hematological: Does not  bruise/bleed easily.  Psychiatric/Behavioral: Negative for dysphoric mood. The patient is not nervous/anxious.        Objective:   Physical Exam  Vitals reviewed. Constitutional: She is oriented to person, place, and time. She appears well-developed and well-nourished. No distress.       Body mass index is 32.34 kg/(m^2).   HENT:  Head: Normocephalic and atraumatic.  Right Ear: External ear normal.  Left Ear: External ear normal.  Mouth/Throat: Oropharynx is clear and moist. No oropharyngeal exudate.  Eyes: Conjunctivae and EOM are normal. Pupils are equal, round, and reactive to light. Right eye exhibits no discharge. Left eye exhibits no discharge. No scleral icterus.  Neck: Normal range of motion. Neck supple. No JVD present. No tracheal deviation present. No thyromegaly present.  Cardiovascular: Normal rate, regular rhythm, normal heart sounds and intact distal pulses.  Exam reveals no gallop and no friction rub.   No murmur heard.      Loud p2  Pulmonary/Chest: Effort normal and breath sounds normal. No respiratory distress. She has no wheezes. She has no rales. She exhibits no tenderness.  Abdominal: Soft. Bowel sounds are normal. She exhibits no distension and no mass. There is  no tenderness. There is no rebound and no guarding.  Musculoskeletal: Normal range of motion. She exhibits no edema and no tenderness.  Lymphadenopathy:    She has no cervical adenopathy.  Neurological: She is alert and oriented to person, place, and time. She has normal reflexes. No cranial nerve deficit. She exhibits normal muscle tone. Coordination normal.  Skin: Skin is warm and dry. No rash noted. She is not diaphoretic. No erythema. No pallor.  Psychiatric: She has a normal mood and affect. Her behavior is normal. Judgment and thought content normal.          Assessment & Plan:

## 2012-03-31 ENCOUNTER — Encounter: Payer: Self-pay | Admitting: Internal Medicine

## 2012-03-31 ENCOUNTER — Ambulatory Visit (INDEPENDENT_AMBULATORY_CARE_PROVIDER_SITE_OTHER): Payer: 59 | Admitting: Internal Medicine

## 2012-03-31 ENCOUNTER — Ambulatory Visit: Payer: 59 | Admitting: Pharmacist

## 2012-03-31 VITALS — BP 115/78 | HR 85 | Temp 97.6°F | Ht 62.0 in | Wt 176.5 lb

## 2012-03-31 DIAGNOSIS — I50812 Chronic right heart failure: Secondary | ICD-10-CM

## 2012-03-31 DIAGNOSIS — R609 Edema, unspecified: Secondary | ICD-10-CM

## 2012-03-31 DIAGNOSIS — I272 Pulmonary hypertension, unspecified: Secondary | ICD-10-CM

## 2012-03-31 DIAGNOSIS — I809 Phlebitis and thrombophlebitis of unspecified site: Secondary | ICD-10-CM

## 2012-03-31 DIAGNOSIS — R791 Abnormal coagulation profile: Secondary | ICD-10-CM

## 2012-03-31 DIAGNOSIS — Z7901 Long term (current) use of anticoagulants: Secondary | ICD-10-CM

## 2012-03-31 DIAGNOSIS — I2789 Other specified pulmonary heart diseases: Secondary | ICD-10-CM

## 2012-03-31 DIAGNOSIS — I509 Heart failure, unspecified: Secondary | ICD-10-CM

## 2012-03-31 DIAGNOSIS — I82409 Acute embolism and thrombosis of unspecified deep veins of unspecified lower extremity: Secondary | ICD-10-CM

## 2012-03-31 DIAGNOSIS — J9610 Chronic respiratory failure, unspecified whether with hypoxia or hypercapnia: Secondary | ICD-10-CM

## 2012-03-31 DIAGNOSIS — J9612 Chronic respiratory failure with hypercapnia: Secondary | ICD-10-CM

## 2012-03-31 DIAGNOSIS — J449 Chronic obstructive pulmonary disease, unspecified: Secondary | ICD-10-CM

## 2012-03-31 DIAGNOSIS — R600 Localized edema: Secondary | ICD-10-CM

## 2012-03-31 LAB — BASIC METABOLIC PANEL
Chloride: 94 mEq/L — ABNORMAL LOW (ref 96–112)
Creat: 0.51 mg/dL (ref 0.50–1.10)
Sodium: 141 mEq/L (ref 135–145)

## 2012-03-31 LAB — POCT INR: INR: 1.8

## 2012-03-31 NOTE — Assessment & Plan Note (Signed)
Coumadin 7.5 mg daily and Lovenox as advised by Dr.Groce Follow up with Coumadin clinic in 3 days

## 2012-03-31 NOTE — Assessment & Plan Note (Signed)
Stable Advair and albuterol

## 2012-03-31 NOTE — Progress Notes (Signed)
Patient ID: Mary [name], female   DOB: Sep 20, 1976, 36 y.o.   MRN: [id number]  36 year old woman with past medical history of recent hospital admission for CHF exacerbation discharged 2 days ago, pulmonary hypertension, obesity hypoventilation syndrome, right heart failure, history of right-sided subclavian DVT and pulmonary embolism comes to the clinic for hospital followup. She is compliant with her medications. She is feeling significantly better since discharge. She has lost 2 pounds since discharge. She is on home oxygen 24 hours a day. She is on BiPAP each bedtime. Her leg swelling has reduced and she is compliant with compression stockings. She does not have any new complaints today  Physical exam   General Appearance:     Filed Vitals:   03/31/12 1025  BP: 115/78  Pulse: 85  Temp: 97.6 F (36.4 C)  TempSrc: Oral  Height: 5\' 2"  (1.575 m)  Weight: 176 lb 8 oz (80.06 kg)  SpO2: 96%     Alert, cooperative, no distress, appears stated age  Head:    Normocephalic, without obvious abnormality, atraumatic  Eyes:    PERRL, conjunctiva/corneas clear, EOM's intact, fundi    benign, both eyes       Neck:   Supple, symmetrical, trachea midline, no adenopathy;       thyroid:  No enlargement/tenderness/nodules; no carotid   bruit or JVD  Lungs:     Clear to auscultation bilaterally, respirations unlabored  Chest wall:    No tenderness or deformity  Heart:    Regular rate and rhythm, S1 and S2 normal, no murmur, rub   or gallop  Abdomen:     Mild redness of the lower abdominal wall skin and ecchymosis from Lovenox injection sites, no tenderness. obese  Extremities:   Extremities-compression stockings on, 1+ edema bilaterally  Pulses:   2+ and symmetric all extremities  Skin:   Skin color, texture, turgor normal, no rashes or lesions  Neurologic:  nonfocal grossly   Review of system- as per history of present illness

## 2012-03-31 NOTE — Assessment & Plan Note (Signed)
Continue doxycycline and Coumadin

## 2012-03-31 NOTE — Assessment & Plan Note (Signed)
Continue BiPAP.  

## 2012-03-31 NOTE — Assessment & Plan Note (Signed)
Continues to lose weight, asymptomatic 2 g sodium diet advised She checks her weight at home. Plan with the Demadex. followup in 1-2 months, earlier if increasing weight or shortness of breath Check chemistry today

## 2012-03-31 NOTE — Assessment & Plan Note (Signed)
Continue BiPAP Followup with cardiology in one month. Consideration for starting vasodilator therapy

## 2012-03-31 NOTE — Assessment & Plan Note (Signed)
Continue compression stockings and Demadex

## 2012-04-01 DIAGNOSIS — G4730 Sleep apnea, unspecified: Secondary | ICD-10-CM

## 2012-04-01 DIAGNOSIS — R0609 Other forms of dyspnea: Secondary | ICD-10-CM

## 2012-04-01 DIAGNOSIS — R0989 Other specified symptoms and signs involving the circulatory and respiratory systems: Secondary | ICD-10-CM

## 2012-04-01 DIAGNOSIS — G4710 Hypersomnia, unspecified: Secondary | ICD-10-CM

## 2012-04-01 NOTE — Procedures (Signed)
NAMEABIR, EROH             ACCOUNT NO.:  [id number]  MEDICAL RECORD NO.:  [id number]          PATIENT TYPE:  OUT  LOCATION:  SLEEP CENTER                 FACILITY:  [facility]  PHYSICIAN:  Makoa Satz D. Maple Hudson, MD, FCCP, FACPDATE OF BIRTH:  1977-08-28  DATE OF STUDY:  03/29/2012                           NOCTURNAL POLYSOMNOGRAM  REFERRING PHYSICIAN:  C. Ulyess Mort, M.D.  REFERRING PHYSICIAN:  C. Ulyess Mort, MD  INDICATION FOR STUDY:  Hypersomnia with sleep apnea.  EPWORTH SLEEPINESS SCORE:  13/24.  BMI 32.6, weight 178 pounds, height 62 inches, neck 15 inches.  MEDICATIONS:  Home medications are charted and reviewed.  SLEEP ARCHITECTURE:  Total sleep time 366.5 minutes with sleep efficiency 93.4%.  Stage I was 2.5%, stage II 78.7%, stage III 2.9%, and REM 16% of total sleep time.  Sleep latency 9.5 minutes, REM latency 7.5 minutes, awake after sleep onset 16.5 minutes, and arousal index 12.3.  Bedtime medication:  None.  RESPIRATORY DATA:  Apnea-hypopnea index (AHI) 4.6 per hour.  A total of 28 events was scored including 12 obstructive apneas, 4 central apneas, and 12 hypopneas.  Events were not positional.  REM AHI 18.5 per hour. There were insufficient numbers of events to meet protocol requirements for initiation of split protocol CPAP titration on this study night.  OXYGEN DATA:  Moderate snoring with oxygen desaturation to a nadir of 73% and mean oxygen saturation through the study of 93% while wearing 4 L of nasal oxygen throughout the study.  During the total recording time 51.3 minutes was recorded with saturation less than 90% and 38.9 minutes was recorded, with saturation less than 88% while wearing 4 L nasal prongs.  CARDIAC DATA:  Sinus rhythm with PACs.  MOVEMENT-PARASOMNIA:  No significant movement disturbance.  No bathroom trips.  IMPRESSIONS-RECOMMENDATION: 1. Sleep architecture shows predominance of stage II sleep interrupted     mostly by  rapid eye movement in a pattern that suggests sedating     medication during the sleep period, although the none is listed. 2. Occasional respiratory events with sleep disturbance, within normal     limits.  Apnea-hypopnea index 4.6 per hour (the normal range for     adults is from 0-5 events per hour).  Moderate snoring with oxygen     desaturation to a nadir of 73% and mean oxygen saturation through     the study of 93% wearing supplemental oxygen. 3. The patient wore nasal oxygen at 4 L per minute throughout the     study as ordered.  On arrival while upright, saturation was 97%.     During this study, she desaturated to a nadir of 73% with a mean     throughout the study of 93% saturation while wearing oxygen at 4 L     per minute by nasal prongs.  During the total recording time,     oxygen saturation was less than 90% for 51.3 minutes and less than     88% for 38.9 minutes while wearing supplemental oxygen.     Jameir Ake D. Maple Hudson, MD, [facility], FACP Diplomate, American Board of Sleep Medicine    CDY/MEDQ  D:  04/01/2012 13:45:39  T:  04/01/2012 14:53:46  Job:  [id number]

## 2012-04-03 ENCOUNTER — Ambulatory Visit (INDEPENDENT_AMBULATORY_CARE_PROVIDER_SITE_OTHER): Payer: 59 | Admitting: Pharmacist

## 2012-04-03 DIAGNOSIS — Z7901 Long term (current) use of anticoagulants: Secondary | ICD-10-CM

## 2012-04-03 DIAGNOSIS — I82629 Acute embolism and thrombosis of deep veins of unspecified upper extremity: Secondary | ICD-10-CM

## 2012-04-03 NOTE — Progress Notes (Signed)
Anti-Coagulation Progress Note  Mary [name] is a 36 y.o. female who is currently on an anti-coagulation regimen.    RECENT RESULTS: Recent results are below, the most recent result is correlated with a dose of 7.5 mg. For the past 3 days. Lab Results  Component Value Date   INR 2.0 04/03/2012   INR 1.8 03/31/2012   INR 1.27 03/29/2012    ANTI-COAG DOSE:   Latest dosing instructions   Total Sun Mon Tue Wed Thu Fri Sat   67.5 7.5 mg 11.25 mg 11.25 mg 11.25 mg 11.25 mg 7.5 mg 7.5 mg    (7.5 mg1) (7.5 mg1.5) (7.5 mg1.5) (7.5 mg1.5) (7.5 mg1.5) (7.5 mg1) (7.5 mg1)         ANTICOAG SUMMARY: Anticoagulation Episode Summary              Current INR goal 2.0-3.0 Next INR check 04/10/2012   INR from last check 2.0 (04/03/2012)     Weekly max dose (mg)  Target end date 09/12/2012   Indications DVT of upper extremity (deep vein thrombosis), Encounter for long-term (current) use of anticoagulants   [facility]    Send INR reminders to    Comments             ANTICOAG TODAY: Anticoagulation Summary as of 04/03/2012              INR goal 2.0-3.0     Selected INR 2.0 (04/03/2012) Next INR check 04/10/2012   Weekly max dose (mg)  Target end date 09/12/2012   Indications DVT of upper extremity (deep vein thrombosis), Encounter for long-term (current) use of anticoagulants    Anticoagulation Episode Summary              INR check [facility] lab    Send INR reminders to    Comments             PATIENT INSTRUCTIONS: Patient Instructions  Patient instructed to take medications as defined in the Anti-coagulation Track section of this encounter.  Patient instructed to take today's dose.  Patient verbalized understanding of these instructions.        FOLLOW-UP Return in 7 days (on 04/10/2012) for Follow up INR.  Hulen Luster, III Pharm.D., CACP

## 2012-04-03 NOTE — Patient Instructions (Signed)
Patient instructed to take medications as defined in the Anti-coagulation Track section of this encounter.  Patient instructed to take today's dose.  Patient verbalized understanding of these instructions.    

## 2012-04-04 ENCOUNTER — Encounter: Payer: 59 | Admitting: Internal Medicine

## 2012-04-05 ENCOUNTER — Ambulatory Visit (HOSPITAL_COMMUNITY)
Admission: RE | Admit: 2012-04-05 | Discharge: 2012-04-05 | Disposition: A | Discharge status: Home / Self Care | Payer: 59 | Source: Ambulatory Visit | Attending: Internal Medicine | Admitting: Internal Medicine

## 2012-04-05 ENCOUNTER — Telehealth: Payer: Self-pay | Admitting: Internal Medicine

## 2012-04-05 ENCOUNTER — Encounter: Payer: Self-pay | Admitting: Internal Medicine

## 2012-04-05 VITALS — BP 104/68 | HR 86 | Wt 169.2 lb

## 2012-04-05 DIAGNOSIS — I279 Pulmonary heart disease, unspecified: Secondary | ICD-10-CM

## 2012-04-05 DIAGNOSIS — I82629 Acute embolism and thrombosis of deep veins of unspecified upper extremity: Secondary | ICD-10-CM

## 2012-04-05 DIAGNOSIS — I272 Pulmonary hypertension, unspecified: Secondary | ICD-10-CM

## 2012-04-05 DIAGNOSIS — I2781 Cor pulmonale (chronic): Secondary | ICD-10-CM

## 2012-04-05 DIAGNOSIS — J449 Chronic obstructive pulmonary disease, unspecified: Secondary | ICD-10-CM

## 2012-04-05 DIAGNOSIS — I2789 Other specified pulmonary heart diseases: Secondary | ICD-10-CM | POA: Insufficient documentation

## 2012-04-05 MED ORDER — TORSEMIDE 20 MG PO TABS: 20.0000 mg | ORAL_TABLET | Freq: Once | ORAL | Status: DC

## 2012-04-05 MED ORDER — SPIRONOLACTONE 25 MG PO TABS: 25.0000 mg | ORAL_TABLET | Freq: Every day | ORAL | Status: DC

## 2012-04-05 NOTE — Assessment & Plan Note (Signed)
Appears to have hypercarbic chronic resp failure and gold stage 4 copd. All numbers I have are from inpatient. Also, a bit perplexing that she has such bad underlying lugn diease and she is so young and decompensated abruptly in FEb-April 2013. I will recheck PFT and abg and alpha 1 (alpha 1 genetic profile as opposed to levels) and reassess

## 2012-04-05 NOTE — Assessment & Plan Note (Signed)
Despite severe obstructive lung disease right heart cath shows elevated PA pressures with near normal pcwp. ? She has eisenmenger related to congenital heart surgery (will need bubble study) or primary PAH esp or CTEPH because HIV and autoimmune markers negative. She is due to see Dr Jesusita Oka 04/05/12. Will send note to him for consideration of VQ scan for CTEPH diagnosis

## 2012-04-05 NOTE — Assessment & Plan Note (Signed)
We have reviewed all her studies

## 2012-04-05 NOTE — Progress Notes (Signed)
Patient ID: Mary [name], female   DOB: 1976-08-01, 36 y.o.   MRN: [id number] Referring Physician: Dr Jens Som Cardiologist: Dr Jens Som Pulmonolgist: Dr Marchelle Gearing PCP: Dr Thad Ranger  HPI: 36 year old woman with h/o obesity, COPD (quit smoking March 2013) and ETOH abuse. Tells me she was born about 3 months prematurely at 1.8 pounds. And had some heart surgery but doesn't know the details. Says she had hole in her heart (?PDA). No problems in childhood healthwise. Never was cyanotic or had problems with activity.   Has long h/o ETOH and tobacco abuse.  Hospitalized in February 2012 for PNA. Recovered fully. Went back to work.   Admitted 03/06/2012 with shortness of breath, edema and swelling of her R arm and face. Found to have R subclavian DVT. Echo revealed PAH with RV dysfunction. (see below). RHC PAH. Had chest CT which confirmed occluded R SVC. Initially felt to be no PE but on re-look possible small + RML PE. + extensive lymphadenopathy felt to be reactive. + ascites. Started on heparin and coumadin. No hypercoag w/u conducted. PFTs apparently demonstrated severe obstruction with gas trapping and some degree of reversibility. HIV was nonreactive, ANA was negative, rheumatoid factor was less than 10. Anti-scleroderma antibody and centromere antibodies were negative. Sedimentation rate was 2. AB CT with ascites but no mention of cirrhosis.     Discharged on April 6 with O2.   03/06/12 ECHO EF 55-60%- Ventricular septum: Septal motion showed paradox. - Right ventricle: The cavity size was moderately dilated.Systolic function was reduced. - Right atrium: The atrium was moderately dilated.  03/06/2012 RHC Ao: (cuff) 111/84  LV: not performed  RA: 23  RV: 59/12/25  PA: 64/45 (mean 53)  PCWP: 18  Fick Cardiac Output: 5.0 L/min  Fick Cardiac Index: 2.76 L/min/m2  Peripheral Aortic Saturation: 90%  Pulmonary Artery Saturation: 67%  PVR =7  Since that time has had 2 hospitalizations in April  the first for respiratory failure and hypersomnolence in setting of fluid overload and narcotic overdose. 2nd for volume overload/RHF. Treated with bipap and diuresis.   03/29/12 Sleep Study. AHI 4.6 Sats down to 73%  She is referred by Dr Jens Som for pulmonary hypertension management.  Complains of SOB on exertion. Denies PND/Orthopnea. Weight at home 167-179. Weight trending down. Has lost 34 pounds since admit.  Feeling much better. Edema resolved. Uses O2 24/7. Uses BiPap every night. [facility]. She stopped smoking 1 1/2 ago. Stopped drinking alcohol 2 weeks ago. Works at a  [employer] but currently not working. Swells in her abdomen and legs but this has improved. Albumin 3.4    Review of Systems:     Cardiac Review of Systems: {Y] = yes [ ]  = no  Chest Pain [    ]  Resting SOB [   ] Exertional SOB  [Y  ]  Orthopnea [  ]   Pedal Edema [   ]    Palpitations [  ] Syncope  [  ]   Presyncope [   ]  General Review of Systems: [Y] = yes [  ]=no Constitional: recent weight change [  ]; anorexia [  ]; fatigue [  ]; nausea [  ]; night sweats [  ]; fever [  ]; or chills [  ];  Dental: poor dentition[  ]; Last Dentist visit:   Eye : blurred vision [  ]; diplopia [   ]; vision changes [  ];  Amaurosis fugax[  ]; Resp: cough [  ];  wheezing[  ];  hemoptysis[  ]; shortness of breath[ Y ]; paroxysmal nocturnal dyspnea[  ]; dyspnea on exertion[ Y ]; or orthopnea[  ];  GI:  gallstones[  ], vomiting[  ];  dysphagia[  ]; melena[  ];  hematochezia [  ]; heartburn[  ];   Hx of  Colonoscopy[  ]; GU: kidney stones [  ]; hematuria[  ];   dysuria [  ];  nocturia[  ];  history of     obstruction [  ];                 Skin: rash, swelling[  ];, hair loss[  ];  peripheral edema[  ];  or itching[  ]; Musculosketetal: myalgias[  ];  joint swelling[  ];  joint  erythema[  ];  joint pain[  ];  back pain[  ];  Heme/Lymph: bruising[  ];  bleeding[  ];  anemia[  ];  Neuro: TIA[  ];  headaches[  ];  stroke[  ];  vertigo[  ];  seizures[  ];   paresthesias[  ];  difficulty walking[  ];  Psych:depression[  ]; anxiety[  ];  Endocrine: diabetes[  ];  thyroid dysfunction[  ];  Immunizations: Flu [  ]; Pneumococcal[  ];  Other:    Past Medical History  Diagnosis Date  . COPD (chronic obstructive pulmonary disease)     no documented PFTs, but patient said it was confirmed  . Chronic asthma   . History of alcoholism     sober last 5 years  . Tobacco abuse   . Irregular menses   . DVT of upper extremity (deep vein thrombosis) April 2013    right subclavian  . Pulmonary embolus April 2013  . Pulmonary HTN April 2013    Cardiac cath on 03/06/12  . OSA (obstructive sleep apnea)   . Cirrhosis     Current Outpatient Prescriptions  Medication Sig Dispense Refill  . albuterol (PROVENTIL HFA;VENTOLIN HFA) 108 (90 BASE) MCG/ACT inhaler Inhale 2 puffs into the lungs every 6 (six) hours as needed. For shortness of breath      . enoxaparin (LOVENOX) 80 MG/0.8ML injection Inject 0.8 mLs (80 mg total) into the skin every 12 (twelve) hours.  4 Syringe  0  . Fluticasone-Salmeterol (ADVAIR) 250-50 MCG/DOSE AEPB Inhale 1 puff into the lungs every 12 (twelve) hours.      Marland Kitchen HYDROcodone-acetaminophen (NORCO) 5-325 MG per tablet Take 1 tablet by mouth every 6 (six) hours as needed for pain.  30 tablet  0  . hydrocortisone cream 0.5 % Apply 1 application topically 3 (three) times daily as needed.  30 g  0  . ibuprofen (ADVIL,MOTRIN) 600 MG tablet Take 600 mg by mouth every 6 (six) hours as needed. For pain      . torsemide (DEMADEX) 20 MG tablet Take 1 tablet (20 mg total) by mouth 2 (two) times daily.  60 tablet  0  . warfarin (COUMADIN) 7.5 MG tablet Take 0.5 tablets (3.75 mg total) by mouth daily. Take 1 tablet today (4/24), then 1/2 tablet daily  30 tablet  0     No  Known Allergies  History   Social History  . Marital Status: Married    Spouse Name: N/A  Number of Children: N/A  . Years of Education: college   Occupational History  .      [employer]   Social History Main Topics  . Smoking status: Former Smoker -- 1.0 packs/day for 25 years    Types: Cigarettes    Quit date: 02/18/2012  . Smokeless tobacco: Not on file   Comment: quit smoking 1 month ago  . Alcohol Use: No     One drink per week  . Drug Use: No  . Sexually Active: Yes -- Female partner(s)    Birth Control/ Protection: None   Other Topics Concern  . 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 Hypertension Father   . Heart disease Father     CHF  . Alcohol abuse Father     PHYSICAL EXAM: Filed Vitals:   04/05/12 1007  BP: 104/68  Pulse: 86   General:  Well appearing. No respiratory difficulty HEENT: normal except for poor dentition. Dysconjugat gaze Neck: supple. no JVD. Carotids 2+ bilat; no bruits. No lymphadenopathy or thryomegaly appreciated. Cor: PMI nondisplaced. Regular rate & rhythm. No rubs, gallops or murmurs. No RV heave. P2 perhaps mildly increased Lungs: clear Abdomen:  Obese soft, nontender, nondistended. No hepatosplenomegaly. No bruits or masses. Good bowel sounds. Extremities: no cyanosis, rash, edema. + clubbing Neuro: alert & oriented x 3, cranial nerves grossly intact. moves all 4 extremities w/o difficulty. Affect pleasant.   ASSESSMENT & PLAN:

## 2012-04-05 NOTE — Assessment & Plan Note (Signed)
She has moderate to severe PAH with associated cor pulmonale. Given her recent R subclavian DVT, I initially thought this was CTEP however CT underwhelming. Serologies have been negative. I think the differential remains: severe intrinsic lung disease with chronic hypoxia, obesity hypoventilation syndrome, PVOD, porto-pulmonary HTN, hepato-pulmonary syndrome, etc. Currently much improved. Will cut demadex down to 20 qd and add spiro 25 daily. Check labs next week. Reinforced need for daily weights and reviewed use of sliding scale diuretics.Primary issues now are to maintain oxygenation and manage fluid status closely.   Will proceed with: 1) VQ scan to exclude chronic PE 2) Echo with bubble study to revaluate RV and PA pressures and exclude shunt 3) Liver u/s to evaluate for cirrhosis and measure portal pressures 4) Refer to pulmonary rehab 5) Consider referral to Hematology for hypercoag w/u I will await the results of these these tests and also need to review her PFTs prior to deciding on whether or not a pulmonary vasodilator is appropriate. If she does need one would lean toward inhaled prostaglandin given her intrinsic lung disease. Counseled on need for tobacco and ETOH cessation.   Time spent 80 minutes.

## 2012-04-05 NOTE — Telephone Encounter (Signed)
Please have her do abg and spirometry at [facility]. I will see her in < 2 weeks. At return do alpha 1

## 2012-04-05 NOTE — Patient Instructions (Signed)
Start Spironolactone 25 mg daily Decrease Torsemide to 20 mg daily  Your physician recommends that you return for lab work in: 1 week at [facility]  Liver Ultrasound with Portal Pressures  VQ Scan  Your physician has requested that you have an echocardiogram, with BUBBLE STUDY. Echocardiography is a painless test that uses sound waves to create images of your heart. It provides your doctor with information about the size and shape of your heart and how well your heart's chambers and valves are working. This procedure takes approximately one hour. There are no restrictions for this procedure.  Your physician recommends that you schedule a follow-up appointment in: 2 weeks with Dr Gala Romney

## 2012-04-05 NOTE — Telephone Encounter (Signed)
I spoke to Dr Teressa Lower today who is ordering all the appropriate pulm htn workup.  Severe copd on pft's done as inpatient  I gave her the above info  Please do abg and full pft (not spirometry) and fu < 2 weeks. Do alpha 1 at fu

## 2012-04-06 NOTE — Telephone Encounter (Signed)
Pt returned Jennifer's call.  Holly D Pryor ° °

## 2012-04-06 NOTE — Telephone Encounter (Signed)
Order placed for pft and abg at [facility]. Rov will be scheduled once I know when pft is.Mary [name], CMA

## 2012-04-07 ENCOUNTER — Ambulatory Visit (HOSPITAL_COMMUNITY)
Admission: RE | Admit: 2012-04-07 | Discharge: 2012-04-07 | Disposition: A | Discharge status: Home / Self Care | Payer: 59 | Source: Ambulatory Visit | Attending: Adult Health | Admitting: Adult Health

## 2012-04-07 DIAGNOSIS — I2789 Other specified pulmonary heart diseases: Secondary | ICD-10-CM | POA: Insufficient documentation

## 2012-04-07 DIAGNOSIS — F1020 Alcohol dependence, uncomplicated: Secondary | ICD-10-CM | POA: Insufficient documentation

## 2012-04-07 DIAGNOSIS — I272 Pulmonary hypertension, unspecified: Secondary | ICD-10-CM

## 2012-04-10 ENCOUNTER — Ambulatory Visit (INDEPENDENT_AMBULATORY_CARE_PROVIDER_SITE_OTHER): Payer: 59 | Admitting: Pharmacist

## 2012-04-10 ENCOUNTER — Ambulatory Visit (HOSPITAL_COMMUNITY)
Admission: RE | Admit: 2012-04-10 | Discharge: 2012-04-10 | Disposition: A | Discharge status: Home / Self Care | Payer: 59 | Source: Ambulatory Visit | Attending: Internal Medicine | Admitting: Internal Medicine

## 2012-04-10 ENCOUNTER — Inpatient Hospital Stay (HOSPITAL_COMMUNITY)
Admission: RE | Admit: 2012-04-10 | Discharge: 2012-04-10 | Disposition: A | Discharge status: Home / Self Care | Payer: 59 | Source: Ambulatory Visit | Attending: Internal Medicine | Admitting: Internal Medicine

## 2012-04-10 DIAGNOSIS — I82629 Acute embolism and thrombosis of deep veins of unspecified upper extremity: Secondary | ICD-10-CM

## 2012-04-10 DIAGNOSIS — Z79899 Other long term (current) drug therapy: Secondary | ICD-10-CM | POA: Insufficient documentation

## 2012-04-10 DIAGNOSIS — J988 Other specified respiratory disorders: Secondary | ICD-10-CM | POA: Insufficient documentation

## 2012-04-10 DIAGNOSIS — R062 Wheezing: Secondary | ICD-10-CM | POA: Insufficient documentation

## 2012-04-10 DIAGNOSIS — R0989 Other specified symptoms and signs involving the circulatory and respiratory systems: Secondary | ICD-10-CM | POA: Insufficient documentation

## 2012-04-10 DIAGNOSIS — J449 Chronic obstructive pulmonary disease, unspecified: Secondary | ICD-10-CM

## 2012-04-10 DIAGNOSIS — Z7901 Long term (current) use of anticoagulants: Secondary | ICD-10-CM

## 2012-04-10 DIAGNOSIS — R0609 Other forms of dyspnea: Secondary | ICD-10-CM | POA: Insufficient documentation

## 2012-04-10 LAB — BLOOD GAS, ARTERIAL
Drawn by: 211791
O2 Content: 4 L/min
O2 Saturation: 96.4 %
Patient temperature: 98.6
pH, Arterial: 7.431 — ABNORMAL HIGH (ref 7.350–7.400)
pO2, Arterial: 86 mmHg (ref 80.0–100.0)

## 2012-04-10 LAB — POCT INR: INR: 3.3

## 2012-04-10 LAB — PULMONARY FUNCTION TEST

## 2012-04-10 MED ORDER — ALBUTEROL SULFATE (5 MG/ML) 0.5% IN NEBU
2.5000 mg | INHALATION_SOLUTION | Freq: Once | RESPIRATORY_TRACT | Status: AC
  Administered 2012-04-10: 2.5 mg via RESPIRATORY_TRACT

## 2012-04-10 NOTE — Patient Instructions (Signed)
Patient instructed to take medications as defined in the Anti-coagulation Track section of this encounter.  Patient instructed to tak3 today's dose.  Patient verbalized understanding of these instructions.     

## 2012-04-10 NOTE — Telephone Encounter (Signed)
LMTCBx1, abg and pft set for 04-10-12. Carron Curie, CMA

## 2012-04-10 NOTE — Progress Notes (Signed)
Anti-Coagulation Progress Note  Mary [name] is a 36 y.o. female who is currently on an anti-coagulation regimen.    RECENT RESULTS: Recent results are below, the most recent result is correlated with a dose of 67.5 mg. per week: Lab Results  Component Value Date   INR 3.30 04/10/2012   INR 2.0 04/03/2012   INR 1.8 03/31/2012    ANTI-COAG DOSE:   Latest dosing instructions   Total Sun Mon Tue Wed Thu Fri Sat   63.75 7.5 mg 7.5 mg 11.25 mg 7.5 mg 11.25 mg 7.5 mg 11.25 mg    (7.5 mg1) (7.5 mg1) (7.5 mg1.5) (7.5 mg1) (7.5 mg1.5) (7.5 mg1) (7.5 mg1.5)         ANTICOAG SUMMARY: Anticoagulation Episode Summary              Current INR goal 2.0-3.0 Next INR check 04/17/2012   INR from last check 3.30! (04/10/2012)     Weekly max dose (mg)  Target end date 09/12/2012   Indications DVT of upper extremity (deep vein thrombosis), Encounter for long-term (current) use of anticoagulants   [facility]erred lab    Send INR reminders to    Comments             ANTICOAG TODAY: Anticoagulation Summary as of 04/10/2012              INR goal 2.0-3.0     Selected INR 3.30! (04/10/2012) Next INR check 04/17/2012   Weekly max dose (mg)  Target end date 09/12/2012   Indications DVT of upper extremity (deep vein thrombosis), Encounter for long-term (current) use of anticoagulants    Anticoagulation Episode Summary              INR check location [facility] Preferred lab    Send INR reminders to    Comments             PATIENT INSTRUCTIONS: Patient Instructions  Patient instructed to take medications as defined in the Anti-coagulation Track section of this encounter.  Patient instructed to tak3 today's dose.  Patient verbalized understanding of these instructions.        FOLLOW-UP Return in 7 days (on 04/17/2012) for Follow up INR.  Hulen Luster, III Pharm.D., CACP

## 2012-04-10 NOTE — Telephone Encounter (Signed)
Pt returned Jennifer's call.  Holly D Pryor ° °

## 2012-04-11 NOTE — Telephone Encounter (Signed)
appt made for 04-14-12 at 3pm. Carron Curie, CMA

## 2012-04-12 ENCOUNTER — Telehealth: Payer: Self-pay | Admitting: Cardiology

## 2012-04-12 ENCOUNTER — Ambulatory Visit (HOSPITAL_COMMUNITY)
Admission: RE | Admit: 2012-04-12 | Discharge: 2012-04-12 | Disposition: A | Discharge status: Home / Self Care | Payer: 59 | Source: Ambulatory Visit | Attending: Internal Medicine | Admitting: Internal Medicine

## 2012-04-12 ENCOUNTER — Ambulatory Visit (HOSPITAL_COMMUNITY)
Admission: RE | Admit: 2012-04-12 | Discharge: 2012-04-12 | Disposition: A | Discharge status: Home / Self Care | Payer: 59 | Source: Ambulatory Visit | Attending: Adult Health | Admitting: Adult Health

## 2012-04-12 DIAGNOSIS — Z8673 Personal history of transient ischemic attack (TIA), and cerebral infarction without residual deficits: Secondary | ICD-10-CM | POA: Insufficient documentation

## 2012-04-12 DIAGNOSIS — G4733 Obstructive sleep apnea (adult) (pediatric): Secondary | ICD-10-CM | POA: Insufficient documentation

## 2012-04-12 DIAGNOSIS — R0989 Other specified symptoms and signs involving the circulatory and respiratory systems: Secondary | ICD-10-CM | POA: Insufficient documentation

## 2012-04-12 DIAGNOSIS — I82629 Acute embolism and thrombosis of deep veins of unspecified upper extremity: Secondary | ICD-10-CM

## 2012-04-12 DIAGNOSIS — I272 Pulmonary hypertension, unspecified: Secondary | ICD-10-CM

## 2012-04-12 DIAGNOSIS — I369 Nonrheumatic tricuspid valve disorder, unspecified: Secondary | ICD-10-CM

## 2012-04-12 DIAGNOSIS — R0600 Dyspnea, unspecified: Secondary | ICD-10-CM

## 2012-04-12 DIAGNOSIS — R0609 Other forms of dyspnea: Secondary | ICD-10-CM | POA: Insufficient documentation

## 2012-04-12 DIAGNOSIS — I509 Heart failure, unspecified: Secondary | ICD-10-CM | POA: Insufficient documentation

## 2012-04-12 DIAGNOSIS — E669 Obesity, unspecified: Secondary | ICD-10-CM | POA: Insufficient documentation

## 2012-04-12 DIAGNOSIS — J449 Chronic obstructive pulmonary disease, unspecified: Secondary | ICD-10-CM | POA: Insufficient documentation

## 2012-04-12 DIAGNOSIS — J4489 Other specified chronic obstructive pulmonary disease: Secondary | ICD-10-CM | POA: Insufficient documentation

## 2012-04-12 MED ORDER — XENON XE 133 GAS
10.0000 | GAS_FOR_INHALATION | Freq: Once | RESPIRATORY_TRACT | Status: AC | PRN
  Administered 2012-04-12: 10 via RESPIRATORY_TRACT

## 2012-04-12 MED ORDER — TECHNETIUM TO 99M ALBUMIN AGGREGATED
6.0000 | Freq: Once | INTRAVENOUS | Status: AC | PRN
  Administered 2012-04-12: 6 via INTRAVENOUS

## 2012-04-12 NOTE — Progress Notes (Signed)
  Echocardiogram 2D Echocardiogram has been performed.  Jorje Guild [facility] 04/12/2012, 9:48 AM

## 2012-04-12 NOTE — Telephone Encounter (Signed)
Pt rtn call [phone]

## 2012-04-12 NOTE — Telephone Encounter (Signed)
Spoke with pt, aware of echo results. 

## 2012-04-14 ENCOUNTER — Encounter: Payer: Self-pay | Admitting: Internal Medicine

## 2012-04-14 ENCOUNTER — Ambulatory Visit (INDEPENDENT_AMBULATORY_CARE_PROVIDER_SITE_OTHER): Payer: 59 | Admitting: Internal Medicine

## 2012-04-14 VITALS — BP 126/82 | HR 104 | Temp 97.6°F | Ht 62.0 in | Wt 167.6 lb

## 2012-04-14 DIAGNOSIS — J441 Chronic obstructive pulmonary disease with (acute) exacerbation: Secondary | ICD-10-CM

## 2012-04-14 DIAGNOSIS — J449 Chronic obstructive pulmonary disease, unspecified: Secondary | ICD-10-CM

## 2012-04-14 MED ORDER — PREDNISONE 10 MG PO TABS: ORAL_TABLET | ORAL | Status: DC

## 2012-04-14 MED ORDER — DOXYCYCLINE HYCLATE 100 MG PO TABS: 100.0000 mg | ORAL_TABLET | Freq: Two times a day (BID) | ORAL | Status: DC

## 2012-04-14 NOTE — Assessment & Plan Note (Signed)
Continue advair and o2 Add spiriva Alpha 1 AT checked 04/14/2012 Refer pulmonary rehab Discussed long term care and prognosis of copd; she is in tears.  Discussed transplant option but need her to go through [facility] workup first

## 2012-04-14 NOTE — Assessment & Plan Note (Signed)
She is denying classic symptoms of aecopd though a bit more fatigued today and has wheeze  Plan  doxy  5 day and antibiotic 8 day

## 2012-04-14 NOTE — Progress Notes (Signed)
Subjective:    Patient ID: Mary [name], female    DOB: [birth year], 36 y.o.   MRN: [id number]  HPI 36 year old female referred by Residency program for supposed copd. Poor historian. Body mass index is 32.34 kg/(m^2).  Her dad did have copd. She smoked from age 20 to 68 and has 25 pack year smoking hx.    She reports Congenital heart surgery -  Nos. Parents deceased and she has no idea what surgeries she had. 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 Then starting march 2013, decompensation  -several admits, copd and pulm htn diagnosed and DVT UE  - on coumadin. Chart review shows ICU admits and being on bipap. On oxygen and diuresis since 1 month. On nocturnal cpap/bipap x 2 weeks.   Sleep study last night  - result pending. Does not know who her sleep doc is. [facility] appt pending 04/05/12  Referred because inpatient pft reportedly showed PFT  Data so far (reivewed after she left office)  - PFTS 03/08/12: fev1 0.58L/1%, FVC 1.18/33%, Ratop 49 and c/w ssevere obstruction. 21% BD response on FVC, RV 219%, DLCO 11/54%  - Duple UE 03/06/12  -  Summary: Findings consistent with acute deep vein thrombosis involving the right upper extremity of the right subclaviian vein   - CT chest 03/06/12  IMPRESSION:  Thrombosed right subclavian vein.  Marked right chest wall edema involving the right breast and right  axilla. Right axillary adenopathy.  No pulmonary embolus (probable motion artifact involving the right  lower lobe pulmonary vessels).  Mediastinal and right hilar adenopathy.  Scattered pulmonary parenchymal changes without dominant worrisome  mass. Patchy parenchymal changes most notable right upper lobe.  This can be reevaluated follow-up after acute episode has cleared.  Small right-sided pleural effusion. Right heart enlargement. Small  superiorly located pericardial  effusion.  Ascites. Slight lobularity liver without other findings of  cirrhosis. Mild third spacing of fluid abdominal wall.  Original Report Authenticated By: Fuller Canada, M.   ECHO 03/06/12:  - Left ventricle: Systolic function was normal. The estimated ejection fraction was in the range of 55% to 60%. - Ventricular septum: Septal motion showed paradox. - Right ventricle: The cavity size was moderately dilated. Systolic function was reduced. - Right atrium: The atrium was moderately dilated  Right Heart cath 03/07/12  Hemodynamic Findings:  Ao: (cuff) 111/84  LV: not performed  RA: 23  RV: 59/12/25  PA: 64/45 (mean 53)  PCWP: 18  Fick Cardiac Output: 5.0 L/min  Fick Cardiac Index: 2.76 L/min/m2  Peripheral Aortic Saturation: 90%  Pulmonary Artery Saturation: 67%    HIV 03/22/12  - NEGATIVE  ABG 03/22/12  7.4/70s/70s  SLEEP STUDY 03/29/12  Dr Maple Hudson   IMPRESSIONS-RECOMMENDATION:  1. Sleep architecture shows predominance of stage II sleep interrupted  mostly by rapid eye movement in a pattern that suggests sedating  medication during the sleep period, although the none is listed.  2. Occasional respiratory events with sleep disturbance, within normal  limits. Apnea-hypopnea index 4.6 per hour (the normal range for  adults is from 0-5 events per hour). Moderate snoring with oxygen  desaturation to a nadir of 73% and mean oxygen saturation through  the study of 93% wearing supplemental oxygen.  3. The patient wore nasal oxygen at 4 L per minute throughout the  study as  ordered. On arrival while upright, saturation was 97%.  During this study, she desaturated to a nadir of 73% with a mean  throughout the study of 93% saturation while wearing oxygen at 4 L  per minute by nasal prongs. During the total recording time,  oxygen saturation was less than 90% for 51.3 minutes and less than  88% for 38.9 minutes while wearing supplemental oxygen.    AUTOIMMUNE  - alpha 1  03/07/12 - (done during illness) - 342  - ANA, RF, scl 70 - April 2013 - NEGATIVE  Continue your medications and oxygen  I am going to review your chart and get back to you; will need few to several days  Nurse will call the lab and get your breathing test and pft results   OV 04/14/2012 FU lab results  -   Lab 04/10/12 1325  PHART 7.431*  PCO2ART 50.0*  PO2ART 86.0  HCO3 32.7*  TCO2 28.2  O2SAT 96.4    PFT 04/10/12: fev1 0.54L/18%, 31% BD response, RATio 49, TL 88%, RV 204$, DLCO 30%,   Other tests by Dr Teressa Lower in interim:   ECHO 5/813 - Normal LV but RV severely dilated  VQ scan 04/12/12 - low prob for PE  [place] Abd 04/07/12 - normal with spleen at upper limit of normal at 12cm  She feels baseline other than today a bit more fatigued. Uses o2; 3-4L [place]. Wants to know when and if she can ever be oxygen free. 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 No new issues. She has understanding of her disease with better understanding of pulm htn than copd. She is only using advair but not using spiriva; though recollects it helped in past. Not attended rehab yet  Past, Family, Social reviewed: no change since last visit     Current outpatient prescriptions:albuterol (PROVENTIL HFA;VENTOLIN HFA) 108 (90 BASE) MCG/ACT inhaler, Inhale 2 puffs into the lungs every 6 (six) hours as needed. For shortness of breath, Disp: , Rfl: ;  Fluticasone-Salmeterol (ADVAIR) 250-50 MCG/DOSE AEPB, Inhale 1 puff into the lungs every 12 (twelve) hours., Disp: , Rfl: ;  spironolactone (ALDACTONE) 25 MG tablet, Take 1 tablet (25 mg total) by mouth daily., Disp: 30 tablet, Rfl: 6 torsemide (DEMADEX) 20 MG tablet, Take 1 tablet (20 mg total) by mouth once., Disp: 30 tablet, Rfl: 6;  warfarin (COUMADIN) 7.5 MG tablet, Take 0.5 tablets (3.75 mg total) by mouth daily. Take 1 tablet today (4/24), then 1/2 tablet daily, Disp: 30 tablet, Rfl: 0   Review of Systems  Constitutional:  Negative for fever and unexpected weight change.  HENT: Negative for ear pain, nosebleeds, congestion, sore throat, rhinorrhea, sneezing, trouble swallowing, dental problem, postnasal drip and sinus pressure.   Eyes: Negative for redness and itching.  Respiratory: Negative for cough, chest tightness, shortness of breath and wheezing.   Cardiovascular: Negative for palpitations and leg swelling.  Gastrointestinal: Negative for nausea and vomiting.  Genitourinary: Negative for dysuria.  Musculoskeletal: Negative for joint swelling.  Skin: Negative for rash.  Neurological: Negative for headaches.  Hematological: Does not bruise/bleed easily.  Psychiatric/Behavioral: Negative for dysphoric mood. The patient is not nervous/anxious.        Objective:   Physical Exam Vitals reviewed. Constitutional: She is oriented to person, place, and time. She appears well-developed and well-nourished. No distress.       Body mass index is 32.34 kg/(m^2).   HENT:  CHRONIC HOARSE VOICE + Head:  Normocephalic and atraumatic.  Right Ear: External ear normal.  Left Ear: External ear normal.  Mouth/Throat: Oropharynx is clear and moist. No oropharyngeal exudate.  Eyes: Conjunctivae and EOM are normal. Pupils are equal, round, and reactive to light. Right eye exhibits no discharge. Left eye exhibits no discharge. No scleral icterus.  Neck: Normal range of motion. Neck supple. No JVD present. No tracheal deviation present. No thyromegaly present.  Cardiovascular: Normal rate, regular rhythm, normal heart sounds and intact distal pulses.  Exam reveals no gallop and no friction rub.   No murmur heard.      Loud p2  Pulmonary/Chest: Effort normal and breath sounds normal. No respiratory distress. She has MILD WHEEZE + that she did not have last visit. She has no rales. She exhibits no tenderness.  Abdominal: Soft. Bowel sounds are normal. She exhibits no distension and no mass. There is no tenderness. There is no  rebound and no guarding.  Musculoskeletal: Normal range of motion. She exhibits no edema and no tenderness.  Lymphadenopathy:    She has no cervical adenopathy.  Neurological: She is alert and oriented to person, place, and time. She has normal reflexes. No cranial nerve deficit. She exhibits normal muscle tone. Coordination normal.  Skin: Skin is warm and dry. No rash noted. She is not diaphoretic. No erythema. No pallor.  Psychiatric: She has a normal mood and affect. Her behavior is normal. Judgment and thought content normal.          Assessment & Plan:

## 2012-04-14 NOTE — Patient Instructions (Signed)
#  COPD Attack  - you might be having a mild attack of copd called flare or exacerbation  - Take doxycycline 100mg  po twice daily x 5 days; take after meals and avoid sunlight - Take prednisone 40 mg daily x 2 days, then 20mg  daily x 2 days, then 10mg  daily x 2 days, then 5mg  daily x 2 days and stop  #COPD  - have alpha 1 blood test today  - continue oxygen   - buy a finger pulse oximeter and use it when needed; make sure pulse ox is > 88% all the time  - continue advair two times daily  - Please start spiriva 1 puff daily - take sample, script and show technique - nurse will refer you to pulmonary rehabilitation  #Pulmonary Hypertension    - per Dr Teressa Lower  #FOllowup  - 2 months or sooner if needed - if falling sick or unwell let me know

## 2012-04-17 ENCOUNTER — Ambulatory Visit (INDEPENDENT_AMBULATORY_CARE_PROVIDER_SITE_OTHER): Payer: 59 | Admitting: Pharmacist

## 2012-04-17 DIAGNOSIS — Z7901 Long term (current) use of anticoagulants: Secondary | ICD-10-CM

## 2012-04-17 DIAGNOSIS — I82629 Acute embolism and thrombosis of deep veins of unspecified upper extremity: Secondary | ICD-10-CM

## 2012-04-17 NOTE — Progress Notes (Signed)
Anti-Coagulation Progress Note  Mary [name] is a 36 y.o. female who is currently on an anti-coagulation regimen.    RECENT RESULTS:f ** Recent results are below, the most recent result is correlated with a dose of 63.75 mg. per week: Lab Results  Component Value Date   INR 7.8 04/17/2012   INR 3.30 04/10/2012   INR 2.0 04/03/2012    ANTI-COAG DOSE:   Latest dosing instructions   Total Sun Mon Tue Wed Thu Fri Sat   56.25 7.5 mg 7.5 mg 7.5 mg 11.25 mg 7.5 mg 7.5 mg 7.5 mg    (7.5 mg1) (7.5 mg1) (7.5 mg1) (7.5 mg1.5) (7.5 mg1) (7.5 mg1) (7.5 mg1)         ANTICOAG SUMMARY: Anticoagulation Episode Summary              Current INR goal 2.0-3.0 Next INR check 04/26/2012   INR from last check 7.8! (04/17/2012)     Weekly max dose (mg)  Target end date 09/12/2012   Indications DVT of upper extremity (deep vein thrombosis), Encounter for long-term (current) use of anticoagulants   [facility] check [facility]    Send INR reminders to    Comments             ANTICOAG TODAY: Anticoagulation Summary as of 04/17/2012              INR goal 2.0-3.0     Selected INR 7.8! (04/17/2012) Next INR check 04/26/2012   Weekly max dose (mg)  Target end date 09/12/2012   Indications DVT of upper extremity (deep vein thrombosis), Encounter for long-term (current) use of anticoagulants    Anticoagulation Episode Summary              INR check [facility]erred lab    Send INR reminders to    Comments             PATIENT INSTRUCTIONS: Patient Instructions  Patient instructed to take medications as defined in the Anti-coagulation Track section of this encounter.  Patient instructed to OMIT/HOLD today's dose.  Patient verbalized understanding of these instructions.        FOLLOW-UP Return in 9 days (on 04/26/2012) for Follow up INR.  Hulen Luster, III Pharm.D., CACP

## 2012-04-17 NOTE — Patient Instructions (Signed)
Patient instructed to take medications as defined in the Anti-coagulation Track section of this encounter.  Patient instructed to OMIT/HOLD today's dose.  Patient verbalized understanding of these instructions.    

## 2012-04-19 ENCOUNTER — Ambulatory Visit (HOSPITAL_COMMUNITY)
Admission: RE | Admit: 2012-04-19 | Discharge: 2012-04-19 | Disposition: A | Discharge status: Home / Self Care | Payer: 59 | Source: Ambulatory Visit | Attending: Internal Medicine | Admitting: Internal Medicine

## 2012-04-19 ENCOUNTER — Encounter (HOSPITAL_COMMUNITY): Payer: Self-pay

## 2012-04-19 VITALS — BP 102/72 | HR 96 | Ht 62.0 in | Wt 167.8 lb

## 2012-04-19 DIAGNOSIS — I272 Pulmonary hypertension, unspecified: Secondary | ICD-10-CM

## 2012-04-19 DIAGNOSIS — I2789 Other specified pulmonary heart diseases: Secondary | ICD-10-CM | POA: Insufficient documentation

## 2012-04-19 NOTE — Patient Instructions (Signed)
Follow up in 2 months  Do not take torsemide if your weight is less than 165 pounds.   Do the following things EVERYDAY: 1) Weigh yourself in the morning before breakfast. Write it down and keep it in a log. 2) Take your medicines as prescribed 3) Eat low salt foods--Limit salt (sodium) to 2000 mg per day.  4) Stay as active as you can everyday 5) Limit all fluids for the day to less than 2 liters

## 2012-04-19 NOTE — Progress Notes (Signed)
Patient ID: Mary [name], female   DOB: [birth year], 36 y.o.   MRN: [id number] Referring Physician: Dr Jens Som Cardiologist: Dr Jens Som Pulmonolgist: Dr Marchelle Gearing PCP: Dr Thad Ranger  HPI: 36 year old woman with h/o obesity, COPD (quit smoking March 2013) and ETOH abuse. Tells me she was born about 3 months prematurely at 1.8 pounds. And had some heart surgery but doesn't know the details. Says she had hole in her heart (?PDA). No problems in childhood healthwise. Never was cyanotic or had problems with activity.   Has long h/o ETOH and tobacco abuse.  Hospitalized in February 2012 for PNA. Recovered fully. Went back to work.   Admitted 03/06/2012 with shortness of breath, edema and swelling of her R arm and face. Found to have R subclavian DVT. Echo revealed PAH with RV dysfunction. (see below). RHC PAH. Had chest CT which confirmed occluded R SVC. Initially felt to be no PE but on re-look possible small + RML PE. + extensive lymphadenopathy felt to be reactive. + ascites. Started on heparin and coumadin. No hypercoag w/u conducted. PFTs apparently demonstrated severe obstruction with gas trapping and some degree of reversibility. HIV was nonreactive, ANA was negative, rheumatoid factor was less than 10. Anti-scleroderma antibody and centromere antibodies were negative. Sedimentation rate was 2. AB CT with ascites but no mention of cirrhosis.     Discharged on April 6 with O2.   03/06/12 ECHO EF 55-60%- Ventricular septum: Septal motion showed paradox. - Right ventricle: The cavity size was moderately dilated.Systolic function was reduced. - Right atrium: The atrium was moderately dilated.  03/06/2012 RHC Ao: (cuff) 111/84  LV: not performed  RA: 23  RV: 59/12/25  PA: 64/45 (mean 53)  PCWP: 18  Fick Cardiac Output: 5.0 L/min  Fick Cardiac Index: 2.76 L/min/m2  Peripheral Aortic Saturation: 90%  Pulmonary Artery Saturation: 67%  PVR =7  Since that time has had 2 hospitalizations in April  the first for respiratory failure and hypersomnolence in setting of fluid overload and narcotic overdose. 2nd for volume overload/RHF. Treated with bipap and diuresis.   PFTS 03/08/12: fev1 0.58L/1%, FVC 1.18/33%, Ratop 49 and c/w ssevere obstruction. 21% BD response on FVC, RV 219%, DLCO 11/54%   03/29/12 Sleep Study. AHI 4.6 Sats down to 73% 04/12/12 ECHO EF 55-60% RV dilation  04/05/12 Abdominal US negative for cirrohsis 04/12/12 VQ low probality for clots.   She returns for follow up. She was started on Doxycycline by Dr Marchelle Gearing but she was unable to pay the medications. Afebrile . Clear sputum. Denies PND/Orthopnea. She remains short of breath  going up hills and steps. She is unable to walk around [facility] due to dyspnea.  Continues to use BiPap. Chronic oxygen 4 liters [place]. Complaint with medications. She is unable to weigh because her scale has broken. Following low salt diet. Remains alcohol and cigarette free.     Review of Systems:     Cardiac Review of Systems: {Y] = yes [ ]  = no  Chest Pain [    ]  Resting SOB [   ] Exertional SOB  [Y  ]  Orthopnea [  ]   Pedal Edema [   ]    Palpitations [  ] Syncope  [  ]   Presyncope [   ]  General Review of Systems: [Y] = yes [  ]=no Constitional: recent weight change [  ]; anorexia [  ]; fatigue [  ]; nausea [  ]; night sweats [  ]; fever [  ];  or chills [  ];                                                                                                                                          Dental: poor dentition[  ]; Last Dentist visit:   Eye : blurred vision [  ]; diplopia [   ]; vision changes [  ];  Amaurosis fugax[  ]; Resp: cough [  ];  wheezing[  ];  hemoptysis[  ]; shortness of breath[ Y ]; paroxysmal nocturnal dyspnea[  ]; dyspnea on exertion[ Y ]; or orthopnea[  ];  GI:  gallstones[  ], vomiting[  ];  dysphagia[  ]; melena[  ];  hematochezia [  ]; heartburn[  ];   Hx of  Colonoscopy[  ]; GU: kidney stones [  ]; hematuria[  ];   dysuria  [  ];  nocturia[  ];  history of     obstruction [  ];                 Skin: rash, swelling[  ];, hair loss[  ];  peripheral edema[  ];  or itching[  ]; Musculosketetal: myalgias[  ];  joint swelling[  ];  joint erythema[  ];  joint pain[  ];  back pain[  ];  Heme/Lymph: bruising[  ];  bleeding[  ];  anemia[  ];  Neuro: TIA[  ];  headaches[  ];  stroke[  ];  vertigo[  ];  seizures[  ];   paresthesias[  ];  difficulty walking[  ];  Psych:depression[  ]; anxiety[  ];  Endocrine: diabetes[  ];  thyroid dysfunction[  ];  Immunizations: Flu [  ]; Pneumococcal[  ];  Other:    Past Medical History  Diagnosis Date  . COPD (chronic obstructive pulmonary disease)     no documented PFTs, but patient said it was confirmed  . Chronic asthma   . History of alcoholism     sober last 5 years  . Tobacco abuse   . Irregular menses   . DVT of upper extremity (deep vein thrombosis) April 2013    right subclavian  . Pulmonary embolus April 2013  . Pulmonary HTN April 2013    Cardiac cath on 03/06/12  . OSA (obstructive sleep apnea)   . Cirrhosis     Current Outpatient Prescriptions  Medication Sig Dispense Refill  . albuterol (PROVENTIL HFA;VENTOLIN HFA) 108 (90 BASE) MCG/ACT inhaler Inhale 2 puffs into the lungs every 6 (six) hours as needed. For shortness of breath      . doxycycline (VIBRA-TABS) 100 MG tablet Take 1 tablet (100 mg total) by mouth 2 (two) times daily.  10 tablet  0  . Fluticasone-Salmeterol (ADVAIR) 250-50 MCG/DOSE AEPB Inhale 1 puff into the lungs every 12 (twelve) hours.      . predniSONE (DELTASONE) 10 MG tablet Take 4 tabs  x 2 days, then 2 tabs x 2 days, then 1 tab x 2 days, then 1/2 tab x 2 days, then stop.  15 tablet  0  . spironolactone (ALDACTONE) 25 MG tablet Take 1 tablet (25 mg total) by mouth daily.  30 tablet  6  . torsemide (DEMADEX) 20 MG tablet Take 1 tablet (20 mg total) by mouth once.  30 tablet  6  . warfarin (COUMADIN) 7.5 MG tablet Take 0.5 tablets (3.75 mg  total) by mouth daily. Take 1 tablet today (4/24), then 1/2 tablet daily  30 tablet  0     No Known Allergies  History   Social History  . Marital Status: Married    Spouse Name: N/A    Number of Children: N/A  . Years of Education: college   Occupational History  .      [employer]   Social History Main Topics  . Smoking status: Former Smoker -- 1.0 packs/day for 25 years    Types: Cigarettes    Quit date: 02/18/2012  . Smokeless tobacco: Not on file   Comment: quit smoking 1 month ago  . Alcohol Use: No     One drink per week  . Drug Use: No  . Sexually Active: Yes -- Female partner(s)    Birth Control/ Protection: None   Other Topics Concern  . 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 Hypertension Father   . Heart disease Father     CHF  . Alcohol abuse Father     PHYSICAL EXAM: Filed Vitals:   04/19/12 0959  BP: 102/72  Pulse: 96   General:  Well appearing. No respiratory difficulty HEENT: normal except for poor dentition. Dysconjugat gaze Neck: supple. no JVD. Carotids 2+ bilat; no bruits. No lymphadenopathy or thryomegaly appreciated. Cor: PMI nondisplaced. Regular rate & rhythm. No rubs, gallops or murmurs. No RV heave. P2 perhaps mildly increased Lungs: clear decreased in th bases on 4 liters [place] Abdomen:  Obese soft, nontender, nondistended. No hepatosplenomegaly. No bruits or masses. Good bowel sounds. Extremities: no cyanosis, rash, edema. + clubbing Neuro: alert & oriented x 3, cranial nerves grossly intact. moves all 4 extremities w/o difficulty. Affect pleasant.   ASSESSMENT & PLAN:

## 2012-04-19 NOTE — Assessment & Plan Note (Addendum)
She returns for follow up. Reviewed VQ scan, ECHO, liver ultrasound, PFTs, and RHC. Pulmonary hypertension likely due to COPD. Continue oxygen continuously. Will consider RHC in the next 2-3 months to re-evaluate hemodynamics. Follow up in 2  months .   Patient seen and examined with Tonye Becket, NP. We discussed all aspects of the encounter. I agree with the assessment and plan as stated above.  I have reviewed all her studies personally. Echo shows normal LV function. RV is moderately dilated but has normal function. In looking at all her studies I think her PH is due primarily to her severe COPD (WHO group III) and previously elevated L-sided pressures (WHO group II). She has already improved markedly with diuresis and supplemental O2. Reinforced need for daily weights and reviewed use of sliding scale diuretics to protect weight of 164-167. We also discussed the option of lung transplantation. At this point no role for pulmonary vasodilators. Can consider repeat RHC in 2 months.

## 2012-04-19 NOTE — Assessment & Plan Note (Signed)
She managing her volume status and appears euvolemic. She does complain of mild dizziness. Instructed to take Torsedmide 20 mg daily only if her weight is >165 pounds. Provided with scales to assist with daily weights. Re-infoced weighing and recording daily. Mary [name]

## 2012-04-26 ENCOUNTER — Ambulatory Visit (INDEPENDENT_AMBULATORY_CARE_PROVIDER_SITE_OTHER): Payer: 59 | Admitting: Pharmacist

## 2012-04-26 DIAGNOSIS — I82629 Acute embolism and thrombosis of deep veins of unspecified upper extremity: Secondary | ICD-10-CM

## 2012-04-26 DIAGNOSIS — Z7901 Long term (current) use of anticoagulants: Secondary | ICD-10-CM

## 2012-04-26 LAB — POCT INR: INR: 6

## 2012-04-26 MED ORDER — WARFARIN SODIUM 7.5 MG PO TABS: ORAL_TABLET | ORAL | Status: DC

## 2012-04-26 NOTE — Patient Instructions (Signed)
Patient instructed to take medications as defined in the Anti-coagulation Track section of this encounter.  Patient instructed to OMIT/HOLD today's dose.  Patient verbalized understanding of these instructions.    

## 2012-04-26 NOTE — Progress Notes (Signed)
Anti-Coagulation Progress Note  Mary [name] is a 36 y.o. female who is currently on an anti-coagulation regimen.    RECENT RESULTS: Recent results are below, the most recent result is correlated with a dose of 56.25 mg. per week: Lab Results  Component Value Date   INR 6.00 04/26/2012   INR 7.8 04/17/2012   INR 3.30 04/10/2012    ANTI-COAG DOSE:   Latest dosing instructions   Total Sun Mon Tue Wed Thu Fri Sat   48.75 7.5 mg 7.5 mg 7.5 mg 7.5 mg 3.75 mg 7.5 mg 7.5 mg    (7.5 mg1) (7.5 mg1) (7.5 mg1) (7.5 mg1) (7.5 mg0.5) (7.5 mg1) (7.5 mg1)         ANTICOAG SUMMARY: Anticoagulation Episode Summary              Current INR goal 2.0-3.0 Next INR check 05/03/2012   INR from last check 6.00! (04/26/2012)     Weekly max dose (mg)  Target end date 09/12/2012   Indications DVT of upper extremity (deep vein thrombosis), Encounter for long-term (current) use of anticoagulants   [facility] check [facility]    Send INR reminders to    Comments             ANTICOAG TODAY: Anticoagulation Summary as of 04/26/2012              INR goal 2.0-3.0     Selected INR 6.00! (04/26/2012) Next INR check 05/03/2012   Weekly max dose (mg)  Target end date 09/12/2012   Indications DVT of upper extremity (deep vein thrombosis), Encounter for long-term (current) use of anticoagulants    Anticoagulation Episode Summary              INR check location [facility] Preferred lab    Send INR reminders to    Comments             PATIENT INSTRUCTIONS: Patient Instructions  Patient instructed to take medications as defined in the Anti-coagulation Track section of this encounter.  Patient instructed to OMIT/HOLD today's dose.  Patient verbalized understanding of these instructions.        FOLLOW-UP Return in 7 days (on 05/03/2012) for Follow up INR at 1100h.  Hulen Luster, III Pharm.D., CACP

## 2012-04-27 ENCOUNTER — Encounter: Payer: Self-pay | Admitting: Internal Medicine

## 2012-04-27 ENCOUNTER — Ambulatory Visit (INDEPENDENT_AMBULATORY_CARE_PROVIDER_SITE_OTHER): Payer: 59 | Admitting: Internal Medicine

## 2012-04-27 VITALS — BP 135/96 | HR 107 | Temp 98.2°F | Ht 62.0 in | Wt 168.5 lb

## 2012-04-27 DIAGNOSIS — J449 Chronic obstructive pulmonary disease, unspecified: Secondary | ICD-10-CM

## 2012-04-27 DIAGNOSIS — N926 Irregular menstruation, unspecified: Secondary | ICD-10-CM

## 2012-04-27 DIAGNOSIS — R599 Enlarged lymph nodes, unspecified: Secondary | ICD-10-CM

## 2012-04-27 DIAGNOSIS — I10 Essential (primary) hypertension: Secondary | ICD-10-CM

## 2012-04-27 DIAGNOSIS — Z0000 Encounter for general adult medical examination without abnormal findings: Secondary | ICD-10-CM

## 2012-04-27 DIAGNOSIS — R079 Chest pain, unspecified: Secondary | ICD-10-CM

## 2012-04-27 DIAGNOSIS — R590 Localized enlarged lymph nodes: Secondary | ICD-10-CM

## 2012-04-27 DIAGNOSIS — J4489 Other specified chronic obstructive pulmonary disease: Secondary | ICD-10-CM

## 2012-04-27 NOTE — Progress Notes (Signed)
Subjective:    Patient ID: Mary [name] female   DOB: 1976-10-09 36 y.o.   MRN: [id number]  HPI: Ms.Mary [name] is a 36 y.o. with a PMHx of Right subclavian DVT on chronic anticoagulation (03/2012), COPD on home oxygen, and right heart failure 2/2 OSA, Pul HTN, and PE who presented to clinic today for the following:  1) Rt subclavian DVT - was recently treated with prednisone for COPD exacerbation, she subsequently noted that her INR became supratherapeutic, for which Dr. Alexandria Lodge is closely monitoring. Her last INR was 6, therefore, she is holding her coumadin yesterday, resume regular dose. Supposed to follow-up next Wednesday. No bleeding in stools, urine, eyes.   2) Preventative care - due for PAP and Tdap. Has not had a period since February, has history of irregular periods, since 36yo, had no period for 18 months. Uusally lasts 3 days, first day very heavy, has diarrhea 1st day, and leg pain. No history of STI/ STD/ PID.    3) COPD - on chronic 4L [place] with ambulation, 3L at rest. Takes her Advair and Spiriva daily without missing doses. States using her albuterol or combivent approximately 1 time every other day, does not feel extensively short of breath. Also remains without fevers, chills.   4) Left sided chest pain  - Patient describes a 1 day history of stable, sharp chest pain located over left chest, that radiates to her shoulder. Episodes last approximately 1 second at a time. Currently, the pain is rated a 0/10 in severity. At its worst, the pain is rated a 6/10 in severity. Aggravated by breathing. Alleviated by none. Has tried Tylenol for it, with mild improvement of symptoms. Admits to shortness of breath at baseline, but not specifically associated with the pain. Denies associated diaphoresis, nausea, palpitations and syncope. Admits to recent increased activity.    Review of Systems: Per HPI.  Current Outpatient Medications: Medication Sig  . albuterol (PROVENTIL  HFA;VENTOLIN HFA) 108 (90 BASE) MCG/ACT inhaler Inhale 2 puffs into the lungs every 6 (six) hours as needed. For shortness of breath  . albuterol-ipratropium (COMBIVENT) 18-103 MCG/ACT inhaler Inhale 2 puffs into the lungs every 6 (six) hours as needed.  . Fluticasone-Salmeterol (ADVAIR) 250-50 MCG/DOSE AEPB Inhale 1 puff into the lungs every 12 (twelve) hours.  Marland Kitchen spironolactone (ALDACTONE) 25 MG tablet Take 1 tablet (25 mg total) by mouth daily.  Marland Kitchen tiotropium (SPIRIVA) 18 MCG inhalation capsule Place 18 mcg into inhaler and inhale daily.  Marland Kitchen torsemide (DEMADEX) 20 MG tablet Take 1 tablet (20 mg total) by mouth once.  . warfarin (COUMADIN) 7.5 MG tablet Take as directed by anticoagulation clinic provider.    Allergies: No Known Allergies  Past Medical History  Diagnosis Date  . COPD (chronic obstructive pulmonary disease)     PFTS 03/08/12: fev1 0.58L/1%, FVC 1.18/33%, Ratop 49 and c/w ssevere obstruction. 21% BD response on FVC, RV 219%, DLCO 11/54%  . Chronic asthma   . History of alcoholism   . Tobacco abuse   . Irregular menses   . DVT of upper extremity (deep vein thrombosis) April 2013    right subclavian // Unclear precipitating cause - possibly significant right heart failure, with vascular stasis potentially predisposing to clotting.    . Pulmonary embolus April 2013    plan for at least 6 months of anticoagulation  . Moderate to severe pulmonary hypertension April 2013    Cardiac cath on 03/06/12 - 1. Elevated pulmonary artery pressures, right sided filling pressures.,  2. PA: 64/45 (mean 53)    . OSA (obstructive sleep apnea)     wears noctural BiPAP  . Hepatic cirrhosis     Noted on CT abdomen (03/2012) - thought to be due to vascular congestion from right heart failure +/- patient's history of alcohol abuse  . Axillary adenopathy     right axillary adenopathy noted on CT chest (03/06/2012)  . Anticoagulation goal of INR 2 to 3     Plan to treat at least 6 months for Right  subclavian DVTand Right middle lung PE noted on 03/06/2012     Past Surgical History  Procedure Date  . Cardiac surgery   . Lung surgery      Objective:    Physical Exam: Filed Vitals:   04/27/12 1405  BP: 135/96  Pulse: 107  Temp:      General: Vital signs reviewed and noted. Well-developed, well-nourished, in no acute distress; alert, appropriate and cooperative throughout examination.  Head: Normocephalic, atraumatic.  Eyes: conjunctivae/corneas clear. PERRL, EOM's intact. Fundi benign. Lateral strabismus left eye.  Ears: TM nonerythematous, not bulging, good light reflex bilaterally.  Nose: Mucous membranes moist, not inflammed, nonerythematous.  Throat: Oropharynx nonerythematous, no exudate appreciated.   Neck: No deformities, masses, or tenderness noted.  Lungs:  Normal respiratory effort. Clear to auscultation BL without crackles or wheezes.  Heart: RRR. S1 and S2 normal without gallop, rubs. No murmur.  Abdomen:  BS normoactive. Soft, Nondistended, non-tender.  No masses or organomegaly.  Extremities: No pretibial edema.  Neurologic: A&O X3, CN II - XII are grossly intact. Motor strength is 5/5 in the all 4 extremities, Sensations intact to light touch, Cerebellar signs negative.     Assessment/ Plan:    Case and plan of care discussed with Dr. Ulyess Mort.

## 2012-04-27 NOTE — Patient Instructions (Addendum)
   Please follow-up at the clinic in 1-2 month, at which time we will reevaluate your blood pressure, dizziness - OR, please follow-up in the clinic sooner if needed.  There have been changes in your medications:  STOP Combivent - CONTINUE Albuterol  DON'T TAKE torsemide until follow-up with Dr. Gala Romney.  CONTINUE Tylenol as needed for your back pain. .  You are getting labs today, if they are abnormal,. I will call you.  Please follow-up with Gynecology for your PAP and menstrual cycle issues.  Please let me know if you got your tetanus shot or not. If not, will need to get one next visit.  If symptoms worsen, or new symptoms arise, please call the clinic or go to the ER.  Please bring all of your medications in a bag to your next visit.    Back Exercises Back exercises help treat and prevent back injuries. The goal of back exercises is to increase the strength of your abdominal and back muscles and the flexibility of your back. These exercises should be started when you no longer have back pain. Back exercises include:  Pelvic Tilt. Lie on your back with your knees bent. Tilt your pelvis until the lower part of your back is against the floor. Hold this position 5 to 10 sec and repeat 5 to 10 times.   Knee to Chest. Pull first 1 knee up against your chest and hold for 20 to 30 seconds, repeat this with the other knee, and then both knees. This may be done with the other leg straight or bent, whichever feels better.   Sit-Ups or Curl-Ups. Bend your knees 90 degrees. Start with tilting your pelvis, and do a partial, slow sit-up, lifting your trunk only 30 to 45 degrees off the floor. Take at least 2 to 3 seconds for each sit-up. Do not do sit-ups with your knees out straight. If partial sit-ups are difficult, simply do the above but with only tightening your abdominal muscles and holding it as directed.   Hip-Lift. Lie on your back with your knees flexed 90 degrees. Push down with your  feet and shoulders as you raise your hips a couple inches off the floor; hold for 10 seconds, repeat 5 to 10 times.   Back arches. Lie on your stomach, propping yourself up on bent elbows. Slowly press on your hands, causing an arch in your low back. Repeat 3 to 5 times. Any initial stiffness and discomfort should lessen with repetition over time.   Shoulder-Lifts. Lie face down with arms beside your body. Keep hips and torso pressed to floor as you slowly lift your head and shoulders off the floor.  Do not overdo your exercises, especially in the beginning. Exercises may cause you some mild back discomfort which lasts for a few minutes; however, if the pain is more severe, or lasts for more than 15 minutes, do not continue exercises until you see your caregiver. Improvement with exercise therapy for back problems is slow.   See your caregivers for assistance with developing a proper back exercise program. Document Released: 12/30/2004 Document Revised: 11/11/2011 Document Reviewed: 11/22/2005 [facility] Patient Information 2012 [place], [place].

## 2012-04-28 LAB — BASIC METABOLIC PANEL
CO2: 27 mEq/L (ref 19–32)
Glucose, Bld: 71 mg/dL (ref 70–99)
Potassium: 4.3 mEq/L (ref 3.5–5.3)
Sodium: 136 mEq/L (ref 135–145)

## 2012-05-03 ENCOUNTER — Ambulatory Visit: Payer: 59

## 2012-05-04 DIAGNOSIS — R079 Chest pain, unspecified: Secondary | ICD-10-CM | POA: Insufficient documentation

## 2012-05-04 NOTE — Assessment & Plan Note (Addendum)
Assessment: Likely musculoskeletal. Very reproducible on exam. Patient is already on coumadin therapy, as well she is actually supratherapeutic.   Plan:      Tylenol PRN for now.  Counseled to watch for worsening symptoms, in which case she would need for reevaluation.

## 2012-05-04 NOTE — Assessment & Plan Note (Addendum)
Assessment: Patient indicates she has history of irregular menstrual cycle. She has not had a cycle since Feb 2013. Has hx of irregular cycles in past as well.   Plan:      Will refer to gyn for PAP and continued evaluation and management of irregular menstrual cycle.

## 2012-05-05 ENCOUNTER — Encounter: Payer: Self-pay | Admitting: Internal Medicine

## 2012-05-05 DIAGNOSIS — Z0000 Encounter for general adult medical examination without abnormal findings: Secondary | ICD-10-CM | POA: Insufficient documentation

## 2012-05-05 NOTE — Assessment & Plan Note (Signed)
Health Maintenance  Topic Date Due  . Pap Smear  10/14/1995  . Tetanus/tdap  10/13/1996  . Influenza Vaccine  09/05/2012    Assessment:  Due for PAP, Tdap (per our records, although pt reports she has already received this approximately 2 years ago).  Will also need diagnostic mammo of right breast due to axillary lymphadenopathy and right breast swelling.  Plan:  GYN referral for PAP and for evaluation of irregular menses.  Asked pt to see if she can locate her prior immunization records.  RX for mammogram.

## 2012-05-05 NOTE — Assessment & Plan Note (Signed)
Assessment: Patient noted to have right axillary lymphadenopathy on CT chest from 03/2012. She will need to have follow-up diagnostic mammogram to evaluate.  Plan:      Will schedule for diagnostic mammogram.

## 2012-05-05 NOTE — Assessment & Plan Note (Signed)
Assessment: Followed by Dr. Marchelle Gearing. Seems to be well controlled and is pending referral for pulmonary rehab. Pt is on Albuterol and Combivent PRN, as well as spiriva. Does not indicate per last dc summary that she needs all of these therapies. I think we should simply to Spiriva and PRN albuterol. Since she is already on an anticholingeric, she likely doesn't also need to be on PRN combivent.  Plan:      Continue Albuterol PRN and Spiriva daily  DC combivent.  Appreciate Dr. Jane Canary care of our patient.

## 2012-05-06 ENCOUNTER — Other Ambulatory Visit: Payer: Self-pay | Admitting: Internal Medicine

## 2012-05-07 ENCOUNTER — Telehealth: Payer: Self-pay | Admitting: Internal Medicine

## 2012-05-07 NOTE — Telephone Encounter (Signed)
Alpha 1 04/14/12 is MM. Will give normal results when she comes back for fu

## 2012-05-08 NOTE — Telephone Encounter (Signed)
Last INR abnl and pt cancelled 05/03/12 appt. Need to check with Dr Alexandria Lodge before refilling med.

## 2012-05-08 NOTE — Telephone Encounter (Signed)
Pt has been scheduled an appt w/Dr Alexandria Lodge 05/15/12 per front desk.

## 2012-05-08 NOTE — Telephone Encounter (Signed)
Pt must make appt with Dr Alexandria Lodge to get further refills.

## 2012-05-09 ENCOUNTER — Telehealth: Payer: Self-pay | Admitting: Cardiology

## 2012-05-09 NOTE — Telephone Encounter (Signed)
New problem:   Per Amy calling need order sign for home visit- chf management. Verbal order would be fine

## 2012-05-09 NOTE — Telephone Encounter (Signed)
Nurse provided with Dr. Prescott Gum office number.  He has seen pt recently.

## 2012-05-11 ENCOUNTER — Telehealth (HOSPITAL_COMMUNITY): Payer: Self-pay | Admitting: *Deleted

## 2012-05-11 NOTE — Telephone Encounter (Signed)
Message copied by Noralee Space on Thu May 11, 2012  9:44 AM ------      Message from: Gwen Her      Created: Tue May 09, 2012  4:40 PM      Contact: [phone]       Amy with [facility] called today to find out about Mary [name] signing off on her orders, she has no PCP.  Please call her back when you have a minute

## 2012-05-11 NOTE — Telephone Encounter (Signed)
Spoke w/Mary [name] gave ok to continue visits and Bensimhon will sign orders

## 2012-05-15 ENCOUNTER — Ambulatory Visit: Payer: 59

## 2012-05-22 ENCOUNTER — Telehealth: Payer: Self-pay | Admitting: *Deleted

## 2012-05-22 ENCOUNTER — Telehealth: Payer: Self-pay | Admitting: Pharmacist

## 2012-05-22 NOTE — Telephone Encounter (Signed)
Called by [facility] RN indicating that patient is going out of town on 26-JUN-13 for an extended 2-3 week period. I called the patient who confirmed this. Patient has had financial hardship "no money, could not buy warfarin, could not buy gas to come to [facility]".  We are nearing the end of two months of warfarin treatment for this first episode known provoking cause upper extremity DVT. Given that she is going to be inaccessible for RTC HERE--and is uninsured (with respect to her ability to get a PT/INR performed while at [facility] [place])--with advice and consent of the patient and after discussion with Dr. Clair Gulling Faculty today for [facility], we elect (patient, physician, myself) to discontinue warfarin on 26-JUN-13 (last day she will be here in [facility]) in lieu of an extended time away from [place] with inability to safely follow her warfarin therapy. Of note--all recorded INRs (visits to [facility] and homebound INRs have been in target range or supratherapeutic which would mitigate against recurrence of UE VTE.

## 2012-05-22 NOTE — Telephone Encounter (Signed)
Call from Amy RN with [facility]. # [phone] Nurse is out at pt's home for last nurse visit. Pt had appointment with Dr Alexandria Lodge today in coumadin clinic and is not able to come in as husband is being admitted.  Amy did finger stick with results of:  INR - 2.6 and PT - 31.1 Since 6/11 pt has coumadin 7.5 mg taking 1 1/2 a day, except today she took 2 tablets because she did miss a dose between 6/11 and now.  She can't remember which day.  Please call Amy. Dr Alexandria Lodge informed.

## 2012-05-22 NOTE — Telephone Encounter (Signed)
Dr Alexandria Lodge and I discussed Ms Mary [name] in person and I support his decision.

## 2012-05-22 NOTE — Telephone Encounter (Signed)
Anti-Coagulation Progress Note  Mary [name] is a 36 y.o. female who is currently on an anti-coagulation regimen.    RECENT RESULTS: Recent results are below, the most recent result is correlated with a dose of 78.75 mg. per week x 1 week. This is NOT the prescribed regimen she was last given. She had last been instructed as documented---to take 7.5mg  qd x 5 days of week and 1/2 x 7.5mg  tablet on 2 days of week. She MISSED A COMPLETE week the [facility] RN reports (patient reported to her) because she could not afford warfarin nor could she get to the pharmacy. She commenced warfarin late April for UE DVT. Given these circumstances ([facility] RN reports she can no longer go to home and collect homebound INR--AND that patient reports/endorses [place] the [place] two--for a 2 to 3 week period, I am wondering--given her INR responsiveness over the period of treatment (all values therapeutic OR SUPRATHERAPEUTIC) if we should give thought to discontinuation of warfarin since UE DVT was of known provoking cause.: Lab Results  Component Value Date   INR 6.00 04/26/2012   INR 7.8 04/17/2012   INR 3.30 04/10/2012    ANTI-COAG DOSE:   Latest dosing instructions   Total Sun Mon Tue Wed Thu Fri Sat   48.75 7.5 mg 7.5 mg 7.5 mg 7.5 mg 3.75 mg 7.5 mg 7.5 mg    (7.5 mg1) (7.5 mg1) (7.5 mg1) (7.5 mg1) (7.5 mg0.5) (7.5 mg1) (7.5 mg1)         ANTICOAG SUMMARY: @ANTICOAGSUMMARY @  ANTICOAG TODAY: @ANTICOAGTODAY @  PATIENT INSTRUCTIONS: There are no Patient Instructions on file for this visit.   FOLLOW-UP No Follow-up on file.  Hulen Luster, III Pharm.D., CACP

## 2012-05-31 ENCOUNTER — Encounter: Payer: 59 | Admitting: Advanced Practice Midwife

## 2012-06-01 ENCOUNTER — Encounter: Payer: Self-pay | Admitting: Internal Medicine

## 2012-06-30 ENCOUNTER — Ambulatory Visit (INDEPENDENT_AMBULATORY_CARE_PROVIDER_SITE_OTHER): Payer: 59 | Admitting: Advanced Practice Midwife

## 2012-06-30 ENCOUNTER — Encounter: Payer: Self-pay | Admitting: Advanced Practice Midwife

## 2012-06-30 VITALS — BP 128/88 | HR 89 | Temp 98.6°F | Ht 62.0 in | Wt 167.3 lb

## 2012-06-30 DIAGNOSIS — N970 Female infertility associated with anovulation: Secondary | ICD-10-CM

## 2012-06-30 DIAGNOSIS — N912 Amenorrhea, unspecified: Secondary | ICD-10-CM

## 2012-06-30 DIAGNOSIS — Z01419 Encounter for gynecological examination (general) (routine) without abnormal findings: Secondary | ICD-10-CM

## 2012-06-30 NOTE — Patient Instructions (Addendum)

## 2012-06-30 NOTE — Progress Notes (Signed)
Patient ID: Mary [name], female   DOB: 1976/03/12, 36 y.o.   MRN: [id number] Subjective:     Mary [name] is a 36 y.o. female here for a routine exam.  Current complaints: Irregular cycles.  Has had irregular cycles all her life, but more irregular now, skips months at a time.She is not really interested in pregnancy, though her husband is.    Personal health questionnaire reviewed: not asked.   Gynecologic History Patient's last menstrual period was 05/15/2012. Contraception: condoms Last Pap: years ago. Results were: normal Last mammogram: none.   Obstetric History OB History    Grav Para Term Preterm Abortions TAB SAB Ect Mult Living                   The following portions of the patient's history were reviewed and updated as appropriate: allergies, current medications, past family history, past medical history, past social history, past surgical history and problem list.  Review of Systems Pertinent items are noted in HPI.    Objective:    General appearance: alert and no distress Neck: no adenopathy, no JVD, supple, symmetrical, trachea midline and thyroid not enlarged, symmetric, no tenderness/mass/nodules Breasts: normal appearance, no masses or tenderness Abdomen: soft, non-tender; bowel sounds normal; no masses,  no organomegaly Pelvic: cervix normal in appearance, exam obscured by obesity, external genitalia normal, no adnexal masses or tenderness, no cervical motion tenderness, rectovaginal septum normal, uterus normal size, shape, and consistency and vagina normal without discharge    Assessment:    Healthy female exam.   Probable PCOS/Anovulation  Plan:    Contraception: condoms and will consider Mirena IUD. Follow up in: 1 year.    Discussed with Dr Erin Fulling Will get TSH to r/o hypothyroidism, but no need to check [place] Probably PCOS/anovulation Pap sent. GC/Chl added per pt request Discussed contraception in light of her significant medical risks.  . Will think about Mirena IUD.

## 2012-07-06 ENCOUNTER — Telehealth: Payer: Self-pay | Admitting: Internal Medicine

## 2012-07-06 MED ORDER — ALBUTEROL SULFATE HFA 108 (90 BASE) MCG/ACT IN AERS: 2.0000 | INHALATION_SPRAY | Freq: Four times a day (QID) | RESPIRATORY_TRACT | Status: DC | PRN

## 2012-07-06 NOTE — Telephone Encounter (Signed)
Refill sent. Pt is aware. Peytyn Trine, CMA  

## 2012-07-10 ENCOUNTER — Encounter: Payer: Self-pay | Admitting: *Deleted

## 2012-09-21 ENCOUNTER — Ambulatory Visit (INDEPENDENT_AMBULATORY_CARE_PROVIDER_SITE_OTHER): Payer: 59 | Admitting: Internal Medicine

## 2012-09-21 ENCOUNTER — Encounter: Payer: Self-pay | Admitting: Internal Medicine

## 2012-09-21 VITALS — BP 110/72 | HR 74 | Temp 97.7°F | Ht 62.5 in | Wt 179.2 lb

## 2012-09-21 DIAGNOSIS — J441 Chronic obstructive pulmonary disease with (acute) exacerbation: Secondary | ICD-10-CM

## 2012-09-21 DIAGNOSIS — J449 Chronic obstructive pulmonary disease, unspecified: Secondary | ICD-10-CM

## 2012-09-21 DIAGNOSIS — Z23 Encounter for immunization: Secondary | ICD-10-CM

## 2012-09-21 MED ORDER — TIOTROPIUM BROMIDE MONOHYDRATE 18 MCG IN CAPS: 18.0000 ug | ORAL_CAPSULE | Freq: Every day | RESPIRATORY_TRACT | Status: DC

## 2012-09-21 MED ORDER — DOXYCYCLINE HYCLATE 100 MG PO TABS: 100.0000 mg | ORAL_TABLET | Freq: Two times a day (BID) | ORAL | Status: DC

## 2012-09-21 MED ORDER — FLUTICASONE-SALMETEROL 100-50 MCG/DOSE IN AEPB: 1.0000 | INHALATION_SPRAY | Freq: Two times a day (BID) | RESPIRATORY_TRACT | Status: DC

## 2012-09-21 MED ORDER — PREDNISONE 10 MG PO TABS: ORAL_TABLET | ORAL | Status: DC

## 2012-09-21 NOTE — Progress Notes (Signed)
Subjective:    Patient ID: Mary [name], female    DOB: 1975-12-16, 36 y.o.   MRN: [id number]  HPI  36 year old female referred by Residency program for supposed copd. Poor historian. Body mass index is 32.34 kg/(m^2).  Her dad did have copd. She smoked from age 32 to 44 and has 25 pack year smoking hx.    She reports Congenital heart surgery -  Nos. Parents deceased and she has no idea what surgeries she had. 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 Then starting march 2013, decompensation  -several admits, copd and pulm htn diagnosed and DVT UE  - on coumadin. Chart review shows ICU admits and being on bipap. On oxygen and diuresis since 1 month. On nocturnal cpap/bipap x 2 weeks.   Sleep study last night  - result pending. Does not know who her sleep doc is. Bensimohn appt pending 04/05/12  Referred because inpatient pft showed copd   Data so far (reivewed after she left office)  - PFTS 03/08/12: fev1 0.58L/1%, FVC 1.18/33%, Ratop 49 and c/w ssevere obstruction. 21% BD response on FVC, RV 219%, DLCO 11/54%  - Duple UE 03/06/12  -  Summary: Findings consistent with acute deep vein thrombosis involving the right upper extremity of the right subclaviian vein   - CT chest 03/06/12  IMPRESSION:  Thrombosed right subclavian vein.  Marked right chest wall edema involving the right breast and right  axilla. Right axillary adenopathy.  No pulmonary embolus (probable motion artifact involving the right  lower lobe pulmonary vessels).  Mediastinal and right hilar adenopathy.  Scattered pulmonary parenchymal changes without dominant worrisome  mass. Patchy parenchymal changes most notable right upper lobe.  This can be reevaluated follow-up after acute episode has cleared.  Small right-sided pleural effusion. Right heart enlargement. Small  superiorly located pericardial  effusion.  Ascites. Slight lobularity liver without other findings of  cirrhosis. Mild third spacing of fluid abdominal wall.  Original Report Authenticated By: Fuller Canada, M.   ECHO 03/06/12:  - Left ventricle: Systolic function was normal. The estimated ejection fraction was in the range of 55% to 60%. - Ventricular septum: Septal motion showed paradox. - Right ventricle: The cavity size was moderately dilated. Systolic function was reduced. - Right atrium: The atrium was moderately dilated  Right Heart cath 03/07/12  Hemodynamic Findings:  Ao: (cuff) 111/84  LV: not performed  RA: 23  RV: 59/12/25  PA: 64/45 (mean 53)  PCWP: 18  Fick Cardiac Output: 5.0 L/min  Fick Cardiac Index: 2.76 L/min/m2  Peripheral Aortic Saturation: 90%  Pulmonary Artery Saturation: 67%    HIV 03/22/12  - NEGATIVE  ABG 03/22/12  7.4/70s/70s  SLEEP STUDY 03/29/12  Dr Maple Hudson   IMPRESSIONS-RECOMMENDATION:  1. Sleep architecture shows predominance of stage II sleep interrupted  mostly by rapid eye movement in a pattern that suggests sedating  medication during the sleep period, although the none is listed.  2. Occasional respiratory events with sleep disturbance, within normal  limits. Apnea-hypopnea index 4.6 per hour (the normal range for  adults is from 0-5 events per hour). Moderate snoring with oxygen  desaturation to a nadir of 73% and mean oxygen saturation through  the study of 93% wearing supplemental oxygen.  3. The patient wore nasal oxygen at 4 L per minute throughout the  study  as ordered. On arrival while upright, saturation was 97%.  During this study, she desaturated to a nadir of 73% with a mean  throughout the study of 93% saturation while wearing oxygen at 4 L  per minute by nasal prongs. During the total recording time,  oxygen saturation was less than 90% for 51.3 minutes and less than  88% for 38.9 minutes while wearing supplemental oxygen.    AUTOIMMUNE  - alpha 1  03/07/12 - (done during illness) - 342  - ANA, RF, scl 70 - April 2013 - NEGATIVE  Continue your medications and oxygen  I am going to review your chart and get back to you; will need few to several days  Nurse will call the lab and get your breathing test and pft results   OV 04/14/2012 FU lab results  -   Lab 04/10/12 1325  PHART 7.431*  PCO2ART 50.0*  PO2ART 86.0  HCO3 32.7*  TCO2 28.2  O2SAT 96.4    PFT 04/10/12: fev1 0.54L/18%, 31% BD response, RATio 49, TL 88%, RV 204$, DLCO 30%,   Other tests by Dr Teressa Lower in interim:   ECHO 5/813 - Normal LV but RV severely dilated  VQ scan 04/12/12 - low prob for PE  [place] Abd 04/07/12 - normal with spleen at upper limit of normal at 12cm  She feels baseline other than today a bit more fatigued. Uses o2; 3-4L [place]. Wants to know when and if she can ever be oxygen free. 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 No new issues. She has understanding of her disease with better understanding of pulm htn than copd. She is only using advair but not using spiriva; though recollects it helped in past. Not attended rehab yet  Past, Family, Social reviewed: no change since last visit  REC #COPD Attack  - you might be having a mild attack of copd called flare or exacerbation  - Take doxycycline 100mg  po twice daily x 5 days; take after meals and avoid sunlight - Take prednisone 40 mg daily x 2 days, then 20mg  daily x 2 days, then 10mg  daily x 2 days, then 5mg  daily x 2 days and stop  #COPD  - have alpha 1 blood test today  - continue oxygen   - buy a finger pulse oximeter and use it when needed; make sure pulse ox is > 88% all the time  - continue advair two times daily  - Please start spiriva 1 puff daily - take sample, script and show technique - nurse will refer you to pulmonary rehabilitation  #Pulmonary Hypertension    - per Dr Teressa Lower  #FOllowup  - 2 months or sooner if needed - if falling sick or  unwell let me know   OV 09/21/2012  COPD followup: Missed appts due to $ issues, husband illness and per her our office failure to followup. REports out of spiriva for 2 months (insurance does not cover it per hx). Ran out of advair few weeks ago. She did not call for free samples. Instead past 2 weeks - using just albuterol more  Dyspneic and more cough than usual esp in morning time when she gets dyspneic walking [place]. Improved as day progresses. Not attended pulm rehab; says no one called her. COmpliant with o2. Dyspnea is associated with worsenign cough. Feels all symptoms are due to running out of medications. No associated fever.  Alpha 1 is MM - checked May  2013  Only isue is rt infrascapular area she had random 3-4 episodes of sharp pain, really severe, rt infrascapular pain of insidious onse since last office visit. No associated hemoptysis, hx of muscle pull, leg edema  Pulm htn: has not followed with DR Bensimohn either.   Smoking: stil in remission. Denies passive smoking either. Husband smokes but not inside house  RUE DVT - in April 2013. Says coumadin stopped byt a Dr Michaell Cowing after 3 months in July 2013 as compelteion of course  Health maintainenace: will have flu shot today 09/21/2012. Had pneumovax 2 years ago.    CAT COPD Symptom & Quality of Life Score (GSK trademark) 0 is no burden. 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 But now back to work and lot better. Financial situation - better. Says she has gas $ to make it to doc appointments. Unemployed after copd diagnosis. Unable to get disability due to husband job (he earns too much for SSI,). She is not able to get SSD due to lack of work credits   Current outpatient prescriptions:albuterol (PROVENTIL HFA;VENTOLIN HFA) 108 (90 BASE) MCG/ACT inhaler, Inhale 2 puffs into the lungs every 6 (six) hours as needed. For shortness of breath, Disp: 1 Inhaler, Rfl: 3;  Fluticasone-Salmeterol (ADVAIR) 250-50 MCG/DOSE AEPB, Inhale 1 puff into the lungs every 12 (twelve) hours., Disp: , Rfl: ;  spironolactone (ALDACTONE) 25 MG tablet, Take 1 tablet (25 mg total) by mouth daily., Disp: 30 tablet, Rfl: 6 tiotropium (SPIRIVA) 18 MCG inhalation capsule, Place 18 mcg into inhaler and inhale daily., Disp: , Rfl: ;  torsemide (DEMADEX) 20 MG tablet, Take 1 tablet (20 mg total) by mouth once., Disp: 30 tablet, Rfl: 6   Review of Systems  Constitutional: Negative for fever and unexpected weight change.  HENT: Positive for congestion and rhinorrhea. Negative for ear pain, nosebleeds, sore throat, sneezing, trouble swallowing, dental problem, postnasal drip and sinus pressure.   Eyes: Negative for redness and itching.  Respiratory: Positive for cough, chest tightness, shortness of breath and wheezing.   Cardiovascular: Negative for palpitations and leg swelling.  Gastrointestinal: Negative for nausea and vomiting.  Genitourinary: Negative for dysuria.  Musculoskeletal: Negative for joint swelling.  Skin: Negative for rash.  Neurological: Positive for headaches.  Hematological: Bruises/bleeds easily.  Psychiatric/Behavioral: Positive for dysphoric mood. The patient is nervous/anxious.        Objective:   Physical Exam  Vitals reviewed. Constitutional: She is oriented to person, place, and  time. She appears well-developed and well-nourished. No distress.       Body mass index is 32.25 kg/(m^2).   HENT:  Head: Normocephalic and atraumatic.  Right Ear: External ear normal.  Left Ear: External ear normal.  Mouth/Throat: Oropharynx  is clear and moist. No oropharyngeal exudate.       Nasal cannula + Caries +  Eyes: Conjunctivae normal and EOM are normal. Pupils are equal, round, and reactive to light. Right eye exhibits no discharge. Left eye exhibits no discharge. No scleral icterus.  Neck: Normal range of motion. Neck supple. No JVD present. No tracheal deviation present. No thyromegaly present.  Cardiovascular: Normal rate, regular rhythm, normal heart sounds and intact distal pulses.  Exam reveals no gallop and no friction rub.   No murmur heard. Pulmonary/Chest: Effort normal and breath sounds normal. No respiratory distress. She has no wheezes. She has no rales. She exhibits no tenderness.       Overall diminshed air entry  Abdominal: Soft. Bowel sounds are normal. She exhibits no distension and no mass. There is no tenderness. There is no rebound and no guarding.  Musculoskeletal: Normal range of motion. She exhibits no edema and no tenderness.  Lymphadenopathy:    She has no cervical adenopathy.  Neurological: She is alert and oriented to person, place, and time. She has normal reflexes. No cranial nerve deficit. She exhibits normal muscle tone. Coordination normal.  Skin: Skin is warm and dry. No rash noted. She is not diaphoretic. No erythema. No pallor.  Psychiatric: She has a normal mood and affect. Her behavior is normal. Judgment and thought content normal.       Flat affect          Assessment & Plan:

## 2012-09-21 NOTE — Patient Instructions (Addendum)
#  COPD Attack  - you might be having a mild attack of copd called flare or exacerbation due to running out of your inhalers or catching a virus  - Take doxycycline 100mg  po twice daily x 5 days; take after meals and avoid sunlight - Take prednisone 40 mg daily x 2 days, then 20mg  daily x 2 days, then 10mg  daily x 2 days, then 5mg  daily x 2 days and stop  #COPD  - - continue oxygen   - buy a finger pulse oximeter and use it when needed; make sure pulse ox is > 88% all the time  - continue advair 100/50 two times daily - take samples, script  - Please start spiriva 1 puff daily - take sample, script and show technique - have flu shot today 09/21/2012 - I  will refer you to pulmonary rehabilitation - you need to do all of above without fail for me to make a transplant referral  #Pulmonary Hypertension    - per Dr Teressa Lower; will refer to him at followup  #Weight management  - will dicuss at future visit  #FOllowup  - 1 months or sooner if needed - CAT score and spirometry at [facility] - if falling sick or unwell let me know

## 2012-09-22 DIAGNOSIS — J441 Chronic obstructive pulmonary disease with (acute) exacerbation: Secondary | ICD-10-CM | POA: Insufficient documentation

## 2012-09-22 NOTE — Assessment & Plan Note (Signed)
#  COPD Attack  - you might be having a mild attack of copd called flare or exacerbation due to running out of your inhalers or catching a virus  - Take doxycycline 100mg  po twice daily x 5 days; take after meals and avoid sunlight - Take prednisone 40 mg daily x 2 days, then 20mg  daily x 2 days, then 10mg  daily x 2 days, then 5mg  daily x 2 days and stop

## 2012-09-22 NOTE — Assessment & Plan Note (Signed)
#  COPD  - - continue oxygen   - buy a finger pulse oximeter and use it when needed; make sure pulse ox is > 88% all the time  - continue advair 100/50 two times daily - take samples, script  - Please start spiriva 1 puff daily - take sample, script and show technique - have flu shot today 09/21/2012 - I  will refer you to pulmonary rehabilitation - you need to do all of above without fail for me to make a transplant referral  #Pulmonary Hypertension    - per Dr Teressa Lower; will refer to him at followup  #Weight management  - will dicuss at future visit  #FOllowup  - 1 months or sooner if needed - CAT score and spirometry at fu - if falling sick or unwell let me know

## 2012-10-20 ENCOUNTER — Encounter: Payer: Self-pay | Admitting: Internal Medicine

## 2012-10-20 ENCOUNTER — Ambulatory Visit (INDEPENDENT_AMBULATORY_CARE_PROVIDER_SITE_OTHER): Payer: 59 | Admitting: Internal Medicine

## 2012-10-20 VITALS — BP 104/74 | HR 76 | Temp 98.1°F | Ht 62.5 in | Wt 178.2 lb

## 2012-10-20 DIAGNOSIS — E669 Obesity, unspecified: Secondary | ICD-10-CM

## 2012-10-20 DIAGNOSIS — J449 Chronic obstructive pulmonary disease, unspecified: Secondary | ICD-10-CM

## 2012-10-20 NOTE — Patient Instructions (Addendum)
#  COPD Attack  - this has resolved  #COPD  - - continue oxygen   - glad you puchased  a finger pulse oximeter and use it when needed; make sure pulse ox is > 88% all the time  - continue advair 100/50 or two times daily - samples given  - continue spiriva 1 puff daily -samples given -glad you had flu shot today 10/20/2012 - I  will refer you again to pulmonary rehabilitation - I have made a transplant referral so you get to know them; glad your church can help you  #Pulmonary Hypertension    - per Dr Teressa Lower; will refer to him at followup  #Weight management  - follow the foods on the left lane of the print out  #FOllowup  - 2 months or sooner if needed - CAT score and spirometry at fu - if falling sick or unwell let me know

## 2012-10-20 NOTE — Progress Notes (Signed)
Subjective:    Patient ID: Mary [name], female    DOB: 1976/03/06, 36 y.o.   MRN: [id number]  HPI #Obesity  - Body mass index is 32.07 kg/(m^2). on Nov 2013  #Ex-Smoker  - age 44 to 55; 25 pack year smoking hx  # Hx of Congenital Heart Surgery NOS   - parents deceased. No idea what surgery she had  #COPD - Alpha 1 is MM - checked May 2013. per hx her dad had copd.  - PFTS 03/08/12: fev1 0.58L/1%, FVC 1.18/33%, Ratop 49 and c/w ssevere obstruction. 21% BD response on FVC, RV 219%, DLCO 11/54% - PFT 04/10/12: fev1 0.54L/18%, 31% BD response, RATio 49, TL 88%, RV 204$, DLCO 30%,  - Abg baseline MAy 2013: 7.43/50/81 - Rx ? On nocturnal cpap/bipap  #Pulmonary Hypertension/Cor Pulmonale - -  - CT chest 03/06/12  IMPRESSION:  Thrombosed right subclavian vein.  Marked right chest wall edema involving the right breast and right  axilla. Right axillary adenopathy.  No pulmonary embolus (probable motion artifact involving the right  lower lobe pulmonary vessels).  Mediastinal and right hilar adenopathy.  Scattered pulmonary parenchymal changes without dominant worrisome  mass. Patchy parenchymal changes most notable right upper lobe.  This can be reevaluated follow-up after acute episode has cleared.  Small right-sided pleural effusion. Right heart enlargement. Small  superiorly located pericardial effusion.  Ascites. Slight lobularity liver without other findings of  cirrhosis. Mild third spacing of fluid abdominal wall.  Original Report Authenticated By: Fuller Canada, M.   ECHO 03/06/12:  - Left ventricle: Systolic function was normal. The estimated ejection fraction was in the range of 55% to 60%. - Ventricular septum: Septal motion showed paradox. - Right ventricle: The cavity size was moderately dilated. Systolic function was reduced. - Right atrium: The atrium was moderately dilated   ECHO 5/813 - Normal LV but RV severely dilated  Right Heart cath 03/07/12  Hemodynamic  Findings:  Ao: (cuff) 111/84  LV: not performed  RA: 23  RV: 59/12/25  PA: 64/45 (mean 53)  PCWP: 18  Fick Cardiac Output: 5.0 L/min  Fick Cardiac Index: 2.76 L/min/m2  Peripheral Aortic Saturation: 90%  Pulmonary Artery Saturation: 67%    HIV 03/22/12   - NEGATIVE  AUTOIMMUNE  - alpha 1 03/07/12 - (done during illness) - 342  - ANA, RF, scl 70 - April 2013 - NEGATIVE   VQ scan 04/12/12  - low prob for PE  [place] Abd 04/07/12  - normal with spleen at upper limit of normal at 12cm      # RUE DVT  - Duple UE 03/06/12: acute deep vein thrombosis involving the right upper extremity of the right subclaviian vein. Rx  Coumadin stopped July 2013 by a Dr Michaell Cowing per her hx   #AECOPD  - several admits early 2013 including icu - May 2013  -opd RX - Oct 2013 - opd Rx  # ? Sleep Apnea  - SLEEP STUDY 03/29/12  Dr Maple Hudson   IMPRESSIONS-RECOMMENDATION:  1. Sleep architecture shows predominance of stage II sleep interrupted  mostly by rapid eye movement in a pattern that suggests sedating  medication during the sleep period, although the none is listed.  2. Occasional respiratory events with sleep disturbance, within normal  limits. Apnea-hypopnea index 4.6 per hour (the normal range for  adults is from 0-5 events per hour). Moderate snoring with oxygen  desaturation to a nadir of 73% and mean oxygen saturation through  the study of 93%  wearing supplemental oxygen.  3. The patient wore nasal oxygen at 4 L per minute throughout the  study as ordered. On arrival while upright, saturation was 97%.  During this study, she desaturated to a nadir of 73% with a mean  throughout the study of 93% saturation while wearing oxygen at 4 L  per minute by nasal prongs. During the total recording time,  oxygen saturation was less than 90% for 51.3 minutes and less than  88% for 38.9 minutes while wearing supplemental oxygen.     #Difficult Social Situation  - Oct 2013:  husband in and out of MD visits  and hospitalizations: diagnosed as mass in testicular wall and polyps; all bengin but mass effect. But now back to work and lot better. Financial situation - better. Says she has gas $ to make it to doc appointments. Unemployed after copd diagnosis. Unable to get disability due to husband job (he earns too much for SSI,). She is not able to get SSD due to lack of work credits    OV 10/20/2012 FU for above. In terms of COPD: she is now reliant on samples and is feeling better. She has another 1-2 months of samples left. Rehab is yet to call her. Yet to see Dr Dominica Severin; has appt. Only issue is fatigue which is at level 5 on CAT score. Oither markers have improved on CAT score and in fact with mdi overall CAT score is down from 26 to 22. IN terms of transplant, I had asked for compliance and attendance at rehab. Thought she is yet to start rehab she is showing compliance with samples and moreover she says she has spoken to her church who can do fundraiser for her if transplant needed. She is willing to meet with [facility] transplnat to get a feel for the process. Smoking still in remission   CAT COPD Symptom & Quality of Life Score (GSK trademark) 0 is no burden. 5 is highest burden 09/21/2012  10/20/2012   Never Cough -> Cough all the time 4 2  No phlegm in chest -> Chest is full of phlegm 4 3  No chest tightness -> Chest feels very tight 2 2  No dyspnea for 1 flight stairs/hill -> Very dyspneic for 1 flight of stairs 5 5  No limitations for ADL at home -> Very limited with ADL at home 3 3  Confident leaving home -> Not at all confident leaving home 2 0  Sleep soundly -> Do not sleep soundly because of lung condition 3 2  Lots of Energy -> No energy at all 3 5  TOTAL Score (max 40)  26 22     Review of Systems  Constitutional: Positive for fatigue. Negative for fever and unexpected weight change.  HENT: Negative for ear pain, nosebleeds, congestion, sore throat, rhinorrhea, sneezing, trouble  swallowing, dental problem, postnasal drip and sinus pressure.   Eyes: Negative for redness and itching.  Respiratory: Positive for cough and shortness of breath. Negative for chest tightness and wheezing.   Cardiovascular: Negative for palpitations and leg swelling.  Gastrointestinal: Negative for nausea and vomiting.  Genitourinary: Negative for dysuria.  Musculoskeletal: Negative for joint swelling.  Skin: Negative for rash.  Neurological: Negative for headaches.  Hematological: Does not bruise/bleed easily.  Psychiatric/Behavioral: Negative for dysphoric mood. The patient is not nervous/anxious.        Objective:   Physical Exam Vitals reviewed. Constitutional: She is oriented to person, place, and time. She appears well-developed and  well-nourished. No distress.       Body mass index is 32.25 kg/(m^2). Estimated Body mass index is 32.07 kg/(m^2) as calculated from the following:   Height as of this encounter: 5' 2.5"(1.588 m).   Weight as of this encounter: 178 lb 3.2 oz(80.831 kg).    HENT:  Head: Normocephalic and atraumatic.  Right Ear: External ear normal.  Left Ear: External ear normal.  Mouth/Throat: Oropharynx is clear and moist. No oropharyngeal exudate.       Nasal cannula + Caries +  Eyes: Conjunctivae normal and EOM are normal. Pupils are equal, round, and reactive to light. Right eye exhibits no discharge. Left eye exhibits no discharge. No scleral icterus.  Neck: Normal range of motion. Neck supple. No JVD present. No tracheal deviation present. No thyromegaly present.  Cardiovascular: Normal rate, regular rhythm, normal heart sounds and intact distal pulses.  Exam reveals no gallop and no friction rub.   No murmur heard. Pulmonary/Chest: Effort normal and breath sounds normal. No respiratory distress. She has no wheezes. She has no rales. She exhibits no tenderness.       Overall diminshed air entry  Abdominal: Soft. Bowel sounds are normal. She exhibits no  distension and no mass. There is no tenderness. There is no rebound and no guarding.  Musculoskeletal: Normal range of motion. She exhibits no edema and no tenderness.  Lymphadenopathy:    She has no cervical adenopathy.  Neurological: She is alert and oriented to person, place, and time. She has normal reflexes. No cranial nerve deficit. She exhibits normal muscle tone. Coordination normal.  Skin: Skin is warm and dry. No rash noted. She is not diaphoretic. No erythema. No pallor.  Psychiatric: She has a normal mood and affect. Her behavior is normal. Judgment and thought content normal.       Flat affect         Assessment & Plan:

## 2012-10-20 NOTE — Addendum Note (Signed)
Addended by: Darrell Jewel on: 10/20/2012 04:31 PM   Modules accepted: Orders

## 2012-10-22 NOTE — Assessment & Plan Note (Signed)
Gave her the [facility] low glycemic diet sheet; asked her to stick to foods in the left lane only. If transplant team feels she needs to lose weight which I am sure they will I will hone in on this more

## 2012-10-22 NOTE — Assessment & Plan Note (Signed)
#  COPD Attack  - this has resolved  #COPD  - - continue oxygen   - glad you puchased  a finger pulse oximeter and use it when needed; make sure pulse ox is > 88% all the time  - continue advair 100/50 or two times daily - samples given  - continue spiriva 1 puff daily -samples given -glad you had flu shot today 10/20/2012 - I  will refer you again to pulmonary rehabilitation - I have made a transplant referral so you get to know them; glad your church can help you  #Pulmonary Hypertension    - per Dr Teressa Lower; will refer to him at followup  #FOllowup  - 2 months or sooner if needed - CAT score and spirometry at [facility] - if falling sick or unwell let me know

## 2012-11-23 ENCOUNTER — Encounter (HOSPITAL_COMMUNITY)
Admission: RE | Admit: 2012-11-23 | Discharge: 2012-11-23 | Disposition: A | Discharge status: Home / Self Care | Payer: 59 | Source: Ambulatory Visit | Attending: Internal Medicine | Admitting: Internal Medicine

## 2012-11-23 ENCOUNTER — Encounter (HOSPITAL_COMMUNITY): Payer: Self-pay

## 2012-11-23 DIAGNOSIS — J4489 Other specified chronic obstructive pulmonary disease: Secondary | ICD-10-CM | POA: Insufficient documentation

## 2012-11-23 DIAGNOSIS — J449 Chronic obstructive pulmonary disease, unspecified: Secondary | ICD-10-CM | POA: Insufficient documentation

## 2012-11-23 DIAGNOSIS — Z5189 Encounter for other specified aftercare: Secondary | ICD-10-CM | POA: Insufficient documentation

## 2012-11-23 NOTE — Progress Notes (Signed)
Mary [name] came in today for orientation to Pulmonary Rehab. Health history and medications reviewed, goals for rehab set.  We discussed upcoming visit to [facility] for enrollment in Transplant Program.  Reassured, support given.  Safety and hand hygiene in the exercise area reviewed with patient.  Patient voices understanding.Demonstration and practice of PLB using pulse oximeter.  Patient able to return demonstration satisfactorily.   6 min walk test done.  978 feet.  Oxygen level 97-98% on 3 L oxygen.  She plans to start exercise on 11/28/12.  We look forward to helping her improve her knowledge and management of her COPD and to improve her QOL.    Cathie Olden RN

## 2012-11-28 ENCOUNTER — Encounter (HOSPITAL_COMMUNITY): Payer: 59

## 2012-11-30 ENCOUNTER — Encounter (HOSPITAL_COMMUNITY)
Admission: RE | Admit: 2012-11-30 | Discharge: 2012-11-30 | Disposition: A | Discharge status: Home / Self Care | Payer: 59 | Source: Ambulatory Visit | Attending: Internal Medicine | Admitting: Internal Medicine

## 2012-12-05 ENCOUNTER — Encounter (HOSPITAL_COMMUNITY): Payer: 59

## 2012-12-07 ENCOUNTER — Encounter (HOSPITAL_COMMUNITY): Payer: 59

## 2012-12-09 ENCOUNTER — Encounter (HOSPITAL_COMMUNITY): Payer: Self-pay | Admitting: Emergency Medicine

## 2012-12-09 ENCOUNTER — Emergency Department (INDEPENDENT_AMBULATORY_CARE_PROVIDER_SITE_OTHER): Payer: 59

## 2012-12-09 ENCOUNTER — Emergency Department (INDEPENDENT_AMBULATORY_CARE_PROVIDER_SITE_OTHER)
Admission: EM | Admit: 2012-12-09 | Discharge: 2012-12-09 | Disposition: A | Discharge status: Home / Self Care | Payer: 59 | Source: Home / Self Care | Attending: Family Medicine | Admitting: Family Medicine

## 2012-12-09 DIAGNOSIS — S93609A Unspecified sprain of unspecified foot, initial encounter: Secondary | ICD-10-CM

## 2012-12-09 DIAGNOSIS — J440 Chronic obstructive pulmonary disease with acute lower respiratory infection: Secondary | ICD-10-CM

## 2012-12-09 DIAGNOSIS — J209 Acute bronchitis, unspecified: Secondary | ICD-10-CM

## 2012-12-09 DIAGNOSIS — S93502A Unspecified sprain of left great toe, initial encounter: Secondary | ICD-10-CM

## 2012-12-09 MED ORDER — MOXIFLOXACIN HCL 400 MG PO TABS: 400.0000 mg | ORAL_TABLET | Freq: Every day | ORAL | Status: DC

## 2012-12-09 NOTE — ED Provider Notes (Signed)
History     CSN: [id number]  Arrival date & time 12/09/12  1100   First MD Initiated Contact with Patient 12/09/12 1115      Chief Complaint  Patient presents with  . URI    (Consider location/radiation/quality/duration/timing/severity/associated sxs/prior treatment) Patient is a 37 y.o. female presenting with URI and toe pain. The history is provided by the patient.  URI The primary symptoms include cough. Primary symptoms do not include fever. The current episode started yesterday. This is a recurrent problem. The problem has been gradually worsening.  Risk factors for severe complications from URI include chronic respiratory disease and chronic cardiac disease.  Toe Pain This is a new problem. The current episode started 1 to 2 hours ago (injured coming down stairs on way to [facility] re lung issue.). The problem has not changed since onset.Associated symptoms include shortness of breath. The symptoms are aggravated by walking.    Past Medical History  Diagnosis Date  . COPD (chronic obstructive pulmonary disease)     PFTS 03/08/12: fev1 0.58L/1%, FVC 1.18/33%, Ratop 49 and c/w ssevere obstruction. 21% BD response on FVC, RV 219%, DLCO 11/54%  . Chronic asthma   . History of alcoholism   . Tobacco abuse   . Irregular menses   . DVT of upper extremity (deep vein thrombosis) April 2013    right subclavian // Unclear precipitating cause - possibly significant right heart failure, with vascular stasis potentially predisposing to clotting.    . Pulmonary embolus April 2013    plan for at least 6 months of anticoagulation  . Moderate to severe pulmonary hypertension April 2013    Cardiac cath on 03/06/12 - 1. Elevated pulmonary artery pressures, right sided filling pressures.,  2. PA: 64/45 (mean 53)    . OSA (obstructive sleep apnea)     wears noctural BiPAP  . Hepatic cirrhosis     Noted on CT abdomen (03/2012) - thought to be due to vascular congestion from right heart failure +/-  patient's history of alcohol abuse  . Axillary adenopathy     right axillary adenopathy noted on CT chest (03/06/2012)  . Anticoagulation goal of INR 2 to 3     Plan to treat at least 6 months for Right subclavian DVTand Right middle lung PE noted on 03/06/2012     Past Surgical History  Procedure Date  . Cardiac surgery   . Lung surgery     Family History  Problem Relation Age of Onset  . Hypertension Father   . Heart disease Father     CHF  . Alcohol abuse Father     History  Substance Use Topics  . Smoking status: Former Smoker -- 1.0 packs/day for 25 years    Types: Cigarettes    Quit date: 02/18/2012  . Smokeless tobacco: Not on file     Comment: quit smoking 1 month ago  . Alcohol Use: No     Comment: One drink per week    OB History    Grav Para Term Preterm Abortions TAB SAB Ect Mult Living                  Review of Systems  Constitutional: Negative.  Negative for fever.  Respiratory: Positive for cough and shortness of breath.   Musculoskeletal: Positive for joint swelling.    Allergies  Review of patient's allergies indicates no known allergies.  Home Medications   Current Outpatient Rx  Name  Route  Sig  Dispense  Refill  . FLUTICASONE-SALMETEROL 250-50 MCG/DOSE IN AEPB   Inhalation   Inhale 1 puff into the lungs every 12 (twelve) hours.         Marland Kitchen TIOTROPIUM BROMIDE MONOHYDRATE 18 MCG IN CAPS   Inhalation   Place 1 capsule (18 mcg total) into inhaler and inhale daily.   30 capsule   5   . ALBUTEROL SULFATE HFA 108 (90 BASE) MCG/ACT IN AERS   Inhalation   Inhale 2 puffs into the lungs every 6 (six) hours as needed. For shortness of breath   1 Inhaler   3   . MOXIFLOXACIN HCL 400 MG PO TABS   Oral   Take 1 tablet (400 mg total) by mouth daily. One tab daily   10 tablet   0   . SPIRONOLACTONE 25 MG PO TABS   Oral   Take 1 tablet (25 mg total) by mouth daily.   30 tablet   6   . TORSEMIDE 20 MG PO TABS   Oral   Take 20 mg by  mouth as needed.           BP 114/74  Pulse 91  Temp 97.7 F (36.5 C) (Oral)  Resp 18  SpO2 99%  LMP 12/09/2012  Physical Exam  Nursing note and vitals reviewed. Constitutional: She is oriented to person, place, and time. She appears well-developed and well-nourished.  HENT:  Right Ear: External ear normal.  Left Ear: External ear normal.  Mouth/Throat: Oropharynx is clear and moist.  Neck: Normal range of motion. Neck supple.  Cardiovascular: Normal rate, regular rhythm, normal heart sounds and intact distal pulses.   Pulmonary/Chest: Effort normal. She has decreased breath sounds. She has rhonchi.  Abdominal: Soft. Bowel sounds are normal.  Musculoskeletal: She exhibits tenderness.       Feet:  Neurological: She is alert and oriented to person, place, and time.  Skin: Skin is warm and dry.    ED Course  Procedures (including critical care time)  Labs Reviewed - No data to display Dg Toe Great Left  12/09/2012  *RADIOLOGY REPORT*  Clinical Data: Larey Seat down stairs and injured left great toe.  LEFT GREAT TOE 3 VIEWS  Comparison: None.  Findings: No evidence of acute or subacute fracture or dislocation. Well-preserved joint spaces.  Well-preserved bone mineral density. No intrinsic osseous abnormalities.  IMPRESSION: Normal examination.   Original Report Authenticated By: Hulan Saas, M.D.      1. Acute bronchitis with chronic obstructive pulmonary disease (COPD)   2. Sprain of great toe of left foot       MDM  X-rays reviewed and report per radiologist.         Linna Hoff, MD 12/09/12 [phone]

## 2012-12-09 NOTE — ED Notes (Signed)
Pt c/o cold sx x1 day Sx include: cough w/green sputum, nauseas, runny nose, chest discomfort due to cough Denies: fevers, vomiting, diarrhea She is alert w/no signs of acute distress.

## 2012-12-12 ENCOUNTER — Encounter (HOSPITAL_COMMUNITY): Payer: 59

## 2012-12-14 ENCOUNTER — Encounter (HOSPITAL_COMMUNITY)
Admission: RE | Admit: 2012-12-14 | Discharge: 2012-12-14 | Disposition: A | Discharge status: Home / Self Care | Payer: 59 | Source: Ambulatory Visit | Attending: Internal Medicine | Admitting: Internal Medicine

## 2012-12-14 DIAGNOSIS — Z5189 Encounter for other specified aftercare: Secondary | ICD-10-CM | POA: Insufficient documentation

## 2012-12-14 DIAGNOSIS — J449 Chronic obstructive pulmonary disease, unspecified: Secondary | ICD-10-CM | POA: Insufficient documentation

## 2012-12-14 DIAGNOSIS — J4489 Other specified chronic obstructive pulmonary disease: Secondary | ICD-10-CM | POA: Insufficient documentation

## 2012-12-14 NOTE — Progress Notes (Signed)
Completed home exercise with patient. Reviewed exercise progression, routine, exercising at a comfortable pace, RPE/Dyspnea scales, how important it is to own a pulse oximeter and how to use one, weather conditions, warning signs and symptoms with exercise, and CP/NTG. We discussed when to call MD. Patient voices understanding. Patient has a goal to prepare herself for the appointments at [facility] and learn about lung transplant . Will continue to encourage and support.

## 2012-12-19 ENCOUNTER — Encounter (HOSPITAL_COMMUNITY): Payer: 59

## 2012-12-21 ENCOUNTER — Encounter (HOSPITAL_COMMUNITY): Payer: 59

## 2012-12-22 ENCOUNTER — Ambulatory Visit: Payer: 59 | Admitting: Internal Medicine

## 2012-12-26 ENCOUNTER — Encounter (HOSPITAL_COMMUNITY)
Admission: RE | Admit: 2012-12-26 | Discharge: 2012-12-26 | Disposition: A | Discharge status: Home / Self Care | Payer: 59 | Source: Ambulatory Visit | Attending: Internal Medicine | Admitting: Internal Medicine

## 2012-12-28 ENCOUNTER — Encounter (HOSPITAL_COMMUNITY)
Admission: RE | Admit: 2012-12-28 | Discharge: 2012-12-28 | Disposition: A | Discharge status: Home / Self Care | Payer: 59 | Source: Ambulatory Visit | Attending: Internal Medicine | Admitting: Internal Medicine

## 2012-12-29 ENCOUNTER — Ambulatory Visit: Payer: 59 | Admitting: Internal Medicine

## 2013-01-02 ENCOUNTER — Encounter (HOSPITAL_COMMUNITY): Payer: 59

## 2013-01-04 ENCOUNTER — Encounter (HOSPITAL_COMMUNITY): Payer: 59

## 2013-01-09 ENCOUNTER — Encounter (HOSPITAL_COMMUNITY)
Admission: RE | Admit: 2013-01-09 | Discharge: 2013-01-09 | Disposition: A | Discharge status: Home / Self Care | Payer: 59 | Source: Ambulatory Visit | Attending: Internal Medicine | Admitting: Internal Medicine

## 2013-01-09 DIAGNOSIS — Z5189 Encounter for other specified aftercare: Secondary | ICD-10-CM | POA: Insufficient documentation

## 2013-01-09 DIAGNOSIS — J4489 Other specified chronic obstructive pulmonary disease: Secondary | ICD-10-CM | POA: Insufficient documentation

## 2013-01-09 DIAGNOSIS — J449 Chronic obstructive pulmonary disease, unspecified: Secondary | ICD-10-CM | POA: Insufficient documentation

## 2013-01-11 ENCOUNTER — Encounter (HOSPITAL_COMMUNITY): Payer: 59

## 2013-01-12 ENCOUNTER — Ambulatory Visit: Payer: 59 | Admitting: Internal Medicine

## 2013-01-16 ENCOUNTER — Encounter (HOSPITAL_COMMUNITY): Payer: 59

## 2013-01-18 ENCOUNTER — Encounter (HOSPITAL_COMMUNITY): Payer: 59

## 2013-01-23 ENCOUNTER — Encounter (HOSPITAL_COMMUNITY): Payer: 59

## 2013-01-25 ENCOUNTER — Encounter (HOSPITAL_COMMUNITY): Payer: 59

## 2013-01-30 ENCOUNTER — Encounter (HOSPITAL_COMMUNITY): Admission: RE | Admit: 2013-01-30 | Payer: 59 | Source: Ambulatory Visit

## 2013-02-01 ENCOUNTER — Telehealth: Payer: Self-pay | Admitting: Internal Medicine

## 2013-02-01 ENCOUNTER — Encounter: Payer: Self-pay | Admitting: *Deleted

## 2013-02-01 ENCOUNTER — Encounter (HOSPITAL_COMMUNITY): Payer: 59

## 2013-02-01 NOTE — Telephone Encounter (Signed)
Letter created and given to the pt.

## 2013-02-06 ENCOUNTER — Encounter (HOSPITAL_COMMUNITY): Payer: 59

## 2013-02-08 ENCOUNTER — Encounter (HOSPITAL_COMMUNITY): Payer: 59

## 2013-02-13 ENCOUNTER — Encounter (HOSPITAL_COMMUNITY): Payer: 59

## 2013-02-15 ENCOUNTER — Encounter (HOSPITAL_COMMUNITY): Payer: 59

## 2013-02-20 ENCOUNTER — Encounter (HOSPITAL_COMMUNITY): Payer: 59

## 2013-02-22 ENCOUNTER — Encounter (HOSPITAL_COMMUNITY): Payer: 59

## 2013-02-27 ENCOUNTER — Encounter (HOSPITAL_COMMUNITY): Admission: RE | Admit: 2013-02-27 | Payer: 59 | Source: Ambulatory Visit

## 2013-02-28 ENCOUNTER — Encounter (HOSPITAL_COMMUNITY): Payer: Self-pay | Admitting: Emergency Medicine

## 2013-02-28 ENCOUNTER — Inpatient Hospital Stay (HOSPITAL_COMMUNITY): Payer: 59

## 2013-02-28 ENCOUNTER — Inpatient Hospital Stay (HOSPITAL_COMMUNITY)
Admission: EM | Admit: 2013-02-28 | Discharge: 2013-03-02 | DRG: 189 | Disposition: A | Discharge status: Home / Self Care | Payer: 59 | Attending: Internal Medicine | Admitting: Internal Medicine

## 2013-02-28 ENCOUNTER — Emergency Department (HOSPITAL_COMMUNITY): Payer: 59

## 2013-02-28 DIAGNOSIS — N926 Irregular menstruation, unspecified: Secondary | ICD-10-CM | POA: Diagnosis present

## 2013-02-28 DIAGNOSIS — R7989 Other specified abnormal findings of blood chemistry: Secondary | ICD-10-CM

## 2013-02-28 DIAGNOSIS — I517 Cardiomegaly: Secondary | ICD-10-CM

## 2013-02-28 DIAGNOSIS — M791 Myalgia, unspecified site: Secondary | ICD-10-CM

## 2013-02-28 DIAGNOSIS — J9620 Acute and chronic respiratory failure, unspecified whether with hypoxia or hypercapnia: Principal | ICD-10-CM | POA: Diagnosis present

## 2013-02-28 DIAGNOSIS — I272 Pulmonary hypertension, unspecified: Secondary | ICD-10-CM | POA: Diagnosis present

## 2013-02-28 DIAGNOSIS — R12 Heartburn: Secondary | ICD-10-CM | POA: Diagnosis present

## 2013-02-28 DIAGNOSIS — Z86718 Personal history of other venous thrombosis and embolism: Secondary | ICD-10-CM

## 2013-02-28 DIAGNOSIS — R599 Enlarged lymph nodes, unspecified: Secondary | ICD-10-CM | POA: Diagnosis present

## 2013-02-28 DIAGNOSIS — J441 Chronic obstructive pulmonary disease with (acute) exacerbation: Secondary | ICD-10-CM | POA: Diagnosis present

## 2013-02-28 DIAGNOSIS — J9621 Acute and chronic respiratory failure with hypoxia: Secondary | ICD-10-CM | POA: Diagnosis present

## 2013-02-28 DIAGNOSIS — J449 Chronic obstructive pulmonary disease, unspecified: Secondary | ICD-10-CM

## 2013-02-28 DIAGNOSIS — Z91199 Patient's noncompliance with other medical treatment and regimen due to unspecified reason: Secondary | ICD-10-CM

## 2013-02-28 DIAGNOSIS — Z5987 Material hardship due to limited financial resources, not elsewhere classified: Secondary | ICD-10-CM

## 2013-02-28 DIAGNOSIS — Z9981 Dependence on supplemental oxygen: Secondary | ICD-10-CM

## 2013-02-28 DIAGNOSIS — Z8249 Family history of ischemic heart disease and other diseases of the circulatory system: Secondary | ICD-10-CM

## 2013-02-28 DIAGNOSIS — Z9119 Patient's noncompliance with other medical treatment and regimen: Secondary | ICD-10-CM

## 2013-02-28 DIAGNOSIS — E876 Hypokalemia: Secondary | ICD-10-CM | POA: Diagnosis present

## 2013-02-28 DIAGNOSIS — IMO0001 Reserved for inherently not codable concepts without codable children: Secondary | ICD-10-CM | POA: Diagnosis present

## 2013-02-28 DIAGNOSIS — J101 Influenza due to other identified influenza virus with other respiratory manifestations: Secondary | ICD-10-CM | POA: Diagnosis present

## 2013-02-28 DIAGNOSIS — J45901 Unspecified asthma with (acute) exacerbation: Secondary | ICD-10-CM | POA: Diagnosis present

## 2013-02-28 DIAGNOSIS — J9612 Chronic respiratory failure with hypercapnia: Secondary | ICD-10-CM

## 2013-02-28 DIAGNOSIS — Z86711 Personal history of pulmonary embolism: Secondary | ICD-10-CM

## 2013-02-28 DIAGNOSIS — Z87891 Personal history of nicotine dependence: Secondary | ICD-10-CM

## 2013-02-28 DIAGNOSIS — G4733 Obstructive sleep apnea (adult) (pediatric): Secondary | ICD-10-CM | POA: Diagnosis present

## 2013-02-28 DIAGNOSIS — J111 Influenza due to unidentified influenza virus with other respiratory manifestations: Secondary | ICD-10-CM

## 2013-02-28 DIAGNOSIS — F1021 Alcohol dependence, in remission: Secondary | ICD-10-CM

## 2013-02-28 DIAGNOSIS — E662 Morbid (severe) obesity with alveolar hypoventilation: Secondary | ICD-10-CM | POA: Diagnosis present

## 2013-02-28 DIAGNOSIS — Z598 Other problems related to housing and economic circumstances: Secondary | ICD-10-CM

## 2013-02-28 DIAGNOSIS — I509 Heart failure, unspecified: Secondary | ICD-10-CM | POA: Diagnosis present

## 2013-02-28 DIAGNOSIS — I2789 Other specified pulmonary heart diseases: Secondary | ICD-10-CM | POA: Diagnosis present

## 2013-02-28 DIAGNOSIS — R748 Abnormal levels of other serum enzymes: Secondary | ICD-10-CM | POA: Diagnosis present

## 2013-02-28 DIAGNOSIS — I50812 Chronic right heart failure: Secondary | ICD-10-CM | POA: Diagnosis present

## 2013-02-28 DIAGNOSIS — I498 Other specified cardiac arrhythmias: Secondary | ICD-10-CM | POA: Diagnosis present

## 2013-02-28 HISTORY — DX: Chronic right heart failure: I50.812

## 2013-02-28 HISTORY — DX: Periodontal disease, unspecified: K05.6

## 2013-02-28 LAB — DIFFERENTIAL
Basophils Relative: 0 % (ref 0–1)
Eosinophils Absolute: 0 10*3/uL (ref 0.0–0.7)
Eosinophils Relative: 0 % (ref 0–5)
Monocytes Absolute: 1.1 10*3/uL — ABNORMAL HIGH (ref 0.1–1.0)
Monocytes Relative: 8 % (ref 3–12)

## 2013-02-28 LAB — COMPREHENSIVE METABOLIC PANEL
ALT: 14 U/L (ref 0–35)
AST: 29 U/L (ref 0–37)
Albumin: 3.6 g/dL (ref 3.5–5.2)
BUN: 13 mg/dL (ref 6–23)
CO2: 27 mEq/L (ref 19–32)
CO2: 27 mEq/L (ref 19–32)
Calcium: 9 mg/dL (ref 8.4–10.5)
Calcium: 9.3 mg/dL (ref 8.4–10.5)
Chloride: 93 mEq/L — ABNORMAL LOW (ref 96–112)
Creatinine, Ser: 0.83 mg/dL (ref 0.50–1.10)
GFR calc non Af Amer: >90 mL/min (ref >90)
GFR calc non Af Amer: 66 mL/min — ABNORMAL LOW (ref >90)
Sodium: 133 mEq/L — ABNORMAL LOW (ref 135–145)
Total Bilirubin: 0.5 mg/dL (ref 0.3–1.2)

## 2013-02-28 LAB — POCT I-STAT 3, ART BLOOD GAS (G3+)
Acid-base deficit: 2 mmol/L (ref 0.0–2.0)
Bicarbonate: 25.3 mEq/L — ABNORMAL HIGH (ref 20.0–24.0)
TCO2: 27 mmol/L (ref 0–100)
pH, Arterial: 7.32 — ABNORMAL LOW (ref 7.350–7.450)

## 2013-02-28 LAB — GLUCOSE, CAPILLARY
Glucose-Capillary: 191 mg/dL — ABNORMAL HIGH (ref 70–99)
Glucose-Capillary: 180 mg/dL — ABNORMAL HIGH (ref 70–99)

## 2013-02-28 LAB — URINALYSIS, ROUTINE W REFLEX MICROSCOPIC
Ketones, ur: 15 mg/dL — AB
Leukocytes, UA: NEGATIVE
Nitrite: NEGATIVE
Specific Gravity, Urine: 1.015 (ref 1.005–1.030)
Urobilinogen, UA: 1 mg/dL (ref 0.0–1.0)
pH: 5.5 (ref 5.0–8.0)

## 2013-02-28 LAB — CK TOTAL AND CKMB (NOT AT ARMC)
Relative Index: 0.9 (ref 0.0–2.5)
Relative Index: 1.1 (ref 0.0–2.5)

## 2013-02-28 LAB — POCT I-STAT, CHEM 8
Calcium, Ion: 1.13 mmol/L (ref 1.12–1.23)
Glucose, Bld: 126 mg/dL — ABNORMAL HIGH (ref 70–99)
HCT: 50 % — ABNORMAL HIGH (ref 36.0–46.0)
Hemoglobin: 17 g/dL — ABNORMAL HIGH (ref 12.0–15.0)
TCO2: 29 mmol/L (ref 0–100)

## 2013-02-28 LAB — URINE MICROSCOPIC-ADD ON

## 2013-02-28 LAB — CBC
HCT: 45.9 % (ref 36.0–46.0)
Hemoglobin: 15.7 g/dL — ABNORMAL HIGH (ref 12.0–15.0)
MCH: 32.1 pg (ref 26.0–34.0)
MCHC: 34.2 g/dL (ref 30.0–36.0)
RBC: 4.89 MIL/uL (ref 3.87–5.11)

## 2013-02-28 LAB — INFLUENZA PANEL BY PCR (TYPE A & B): Influenza B By PCR: NEGATIVE

## 2013-02-28 LAB — TROPONIN I
Troponin I: 0.32 ng/mL (ref <0.30)
Troponin I: <0.3 ng/mL (ref <0.30)

## 2013-02-28 LAB — EXPECTORATED SPUTUM ASSESSMENT W GRAM STAIN, RFLX TO RESP C

## 2013-02-28 MED ORDER — SODIUM CHLORIDE 0.9 % IV SOLN: 250.0000 mL | INTRAVENOUS | Status: DC | PRN

## 2013-02-28 MED ORDER — ENOXAPARIN SODIUM 60 MG/0.6ML ~~LOC~~ SOLN
80.0000 mg | Freq: Once | SUBCUTANEOUS | Status: AC
  Administered 2013-02-28: 80 mg via SUBCUTANEOUS
  Filled 2013-02-28: qty 1.2

## 2013-02-28 MED ORDER — METHYLPREDNISOLONE SODIUM SUCC 125 MG IJ SOLR
125.0000 mg | Freq: Once | INTRAMUSCULAR | Status: AC
  Administered 2013-02-28: 125 mg via INTRAVENOUS
  Filled 2013-02-28: qty 2

## 2013-02-28 MED ORDER — TRAMADOL HCL 50 MG PO TABS
50.0000 mg | ORAL_TABLET | Freq: Four times a day (QID) | ORAL | Status: DC | PRN
  Administered 2013-02-28 – 2013-03-01 (×5): 50 mg via ORAL
  Filled 2013-02-28 (×6): qty 1

## 2013-02-28 MED ORDER — IPRATROPIUM BROMIDE 0.02 % IN SOLN
0.5000 mg | Freq: Four times a day (QID) | RESPIRATORY_TRACT | Status: DC
  Administered 2013-02-28 (×2): 0.5 mg via RESPIRATORY_TRACT
  Filled 2013-02-28 (×2): qty 2.5

## 2013-02-28 MED ORDER — FOLIC ACID 1 MG PO TABS
1.0000 mg | ORAL_TABLET | Freq: Every day | ORAL | Status: DC
  Administered 2013-02-28 – 2013-03-02 (×3): 1 mg via ORAL
  Filled 2013-02-28 (×3): qty 1

## 2013-02-28 MED ORDER — VITAMIN B-1 100 MG PO TABS
100.0000 mg | ORAL_TABLET | Freq: Every day | ORAL | Status: DC
  Administered 2013-02-28 – 2013-03-02 (×3): 100 mg via ORAL
  Filled 2013-02-28 (×3): qty 1

## 2013-02-28 MED ORDER — LEVALBUTEROL HCL 0.63 MG/3ML IN NEBU
0.6300 mg | INHALATION_SOLUTION | Freq: Three times a day (TID) | RESPIRATORY_TRACT | Status: DC
  Administered 2013-03-01 – 2013-03-02 (×5): 0.63 mg via RESPIRATORY_TRACT
  Filled 2013-02-28 (×9): qty 3

## 2013-02-28 MED ORDER — POTASSIUM CHLORIDE CRYS ER 20 MEQ PO TBCR
40.0000 meq | EXTENDED_RELEASE_TABLET | Freq: Once | ORAL | Status: AC
  Administered 2013-02-28: 40 meq via ORAL
  Filled 2013-02-28: qty 2

## 2013-02-28 MED ORDER — ENOXAPARIN SODIUM 40 MG/0.4ML ~~LOC~~ SOLN: 40.0000 mg | SUBCUTANEOUS | Status: DC

## 2013-02-28 MED ORDER — OSELTAMIVIR PHOSPHATE 75 MG PO CAPS
75.0000 mg | ORAL_CAPSULE | Freq: Two times a day (BID) | ORAL | Status: DC
  Administered 2013-02-28 – 2013-03-02 (×4): 75 mg via ORAL
  Filled 2013-02-28 (×5): qty 1

## 2013-02-28 MED ORDER — ALBUTEROL SULFATE (5 MG/ML) 0.5% IN NEBU: 5.0000 mg | INHALATION_SOLUTION | Freq: Once | RESPIRATORY_TRACT | Status: DC

## 2013-02-28 MED ORDER — METHYLPREDNISOLONE SODIUM SUCC 125 MG IJ SOLR
80.0000 mg | Freq: Two times a day (BID) | INTRAMUSCULAR | Status: DC
  Administered 2013-02-28 – 2013-03-02 (×5): 80 mg via INTRAVENOUS
  Filled 2013-02-28 (×4): qty 1.28
  Filled 2013-02-28: qty 2
  Filled 2013-02-28 (×2): qty 1.28

## 2013-02-28 MED ORDER — PIPERACILLIN-TAZOBACTAM 3.375 G IVPB
3.3750 g | Freq: Once | INTRAVENOUS | Status: AC
  Administered 2013-02-28: 3.375 g via INTRAVENOUS
  Filled 2013-02-28: qty 50

## 2013-02-28 MED ORDER — ASPIRIN EC 81 MG PO TBEC
81.0000 mg | DELAYED_RELEASE_TABLET | Freq: Every day | ORAL | Status: DC
  Administered 2013-03-01 – 2013-03-02 (×2): 81 mg via ORAL
  Filled 2013-02-28 (×2): qty 1

## 2013-02-28 MED ORDER — GUAIFENESIN ER 600 MG PO TB12
600.0000 mg | ORAL_TABLET | Freq: Two times a day (BID) | ORAL | Status: DC
  Administered 2013-02-28 – 2013-03-02 (×5): 600 mg via ORAL
  Filled 2013-02-28 (×6): qty 1

## 2013-02-28 MED ORDER — SODIUM CHLORIDE 0.9 % IJ SOLN
3.0000 mL | Freq: Two times a day (BID) | INTRAMUSCULAR | Status: DC
  Administered 2013-02-28 – 2013-03-02 (×5): 3 mL via INTRAVENOUS

## 2013-02-28 MED ORDER — LEVALBUTEROL HCL 0.63 MG/3ML IN NEBU
0.6300 mg | INHALATION_SOLUTION | Freq: Four times a day (QID) | RESPIRATORY_TRACT | Status: DC
  Administered 2013-02-28 (×2): 0.63 mg via RESPIRATORY_TRACT
  Filled 2013-02-28 (×2): qty 3

## 2013-02-28 MED ORDER — ACETAMINOPHEN 325 MG PO TABS: 650.0000 mg | ORAL_TABLET | Freq: Four times a day (QID) | ORAL | Status: DC | PRN

## 2013-02-28 MED ORDER — FOLIC ACID 5 MG/ML IJ SOLN: 1.0000 mg | Freq: Every day | INTRAMUSCULAR | Status: DC

## 2013-02-28 MED ORDER — KETOROLAC TROMETHAMINE 30 MG/ML IJ SOLN
30.0000 mg | Freq: Once | INTRAMUSCULAR | Status: AC
  Administered 2013-02-28: 30 mg via INTRAVENOUS
  Filled 2013-02-28: qty 1

## 2013-02-28 MED ORDER — VANCOMYCIN HCL IN DEXTROSE 1-5 GM/200ML-% IV SOLN
1000.0000 mg | Freq: Once | INTRAVENOUS | Status: AC
  Administered 2013-02-28: 1000 mg via INTRAVENOUS
  Filled 2013-02-28: qty 200

## 2013-02-28 MED ORDER — SODIUM CHLORIDE 0.9 % IJ SOLN: 3.0000 mL | INTRAMUSCULAR | Status: DC | PRN

## 2013-02-28 MED ORDER — DOXYCYCLINE HYCLATE 100 MG PO TABS
100.0000 mg | ORAL_TABLET | Freq: Two times a day (BID) | ORAL | Status: DC
  Administered 2013-02-28 – 2013-03-02 (×5): 100 mg via ORAL
  Filled 2013-02-28 (×6): qty 1

## 2013-02-28 MED ORDER — IOHEXOL 350 MG/ML SOLN
100.0000 mL | Freq: Once | INTRAVENOUS | Status: AC | PRN
  Administered 2013-02-28: 100 mL via INTRAVENOUS

## 2013-02-28 MED ORDER — ALBUTEROL SULFATE (5 MG/ML) 0.5% IN NEBU
5.0000 mg | INHALATION_SOLUTION | Freq: Once | RESPIRATORY_TRACT | Status: AC
  Administered 2013-02-28: 5 mg via RESPIRATORY_TRACT
  Filled 2013-02-28: qty 1

## 2013-02-28 MED ORDER — MOMETASONE FURO-FORMOTEROL FUM 100-5 MCG/ACT IN AERO
2.0000 | INHALATION_SPRAY | Freq: Two times a day (BID) | RESPIRATORY_TRACT | Status: DC
  Administered 2013-02-28 – 2013-03-02 (×4): 2 via RESPIRATORY_TRACT
  Filled 2013-02-28: qty 8.8

## 2013-02-28 MED ORDER — ACETAMINOPHEN 650 MG RE SUPP: 650.0000 mg | Freq: Four times a day (QID) | RECTAL | Status: DC | PRN

## 2013-02-28 MED ORDER — IPRATROPIUM BROMIDE 0.02 % IN SOLN
0.5000 mg | Freq: Three times a day (TID) | RESPIRATORY_TRACT | Status: DC
  Administered 2013-03-01 – 2013-03-02 (×5): 0.5 mg via RESPIRATORY_TRACT
  Filled 2013-02-28 (×5): qty 2.5

## 2013-02-28 NOTE — ED Notes (Addendum)
Kalia-Reynolds, DO at bedside

## 2013-02-28 NOTE — Progress Notes (Signed)
Pt admitted from ED oriented to room, weighed, VS done tele monitor placed on called CMT to register tele. Alert/oriented. Husband at bedside

## 2013-02-28 NOTE — Progress Notes (Signed)
  Echocardiogram 2D Echocardiogram has been performed.  Mary [name] 02/28/2013, 4:19 PM

## 2013-02-28 NOTE — H&P (Signed)
Date: 02/28/2013                Patient Name:  Mary [name]  MRN: [id number]   DOB: Mar 16, 1976  Age / Sex: 37 y.o., female   PCP: Priscella Mann, DO              Medical Service: Internal Medicine Teaching Service              Attending Physician: Dr. Criselda Peaches    First Contact: Dr. Shirlee Latch Pager: [phone]  Second Contact: Dr. Everardo Beals Pager: [phone]            After Hours (After 5pm / weekends / holidays): First Contact Second Contact Pager: Pager: [phone]         Chief Complaint: shortness of breath and hypoxia  History of Present Illness: Patient is a 37 y.o. female with a PMHx of GOLD 4 COPD on home oxygen (3L via [place]), OSA/OHS, chronic right-sided CHF/ cor pulmonale, who presents to [facility] for evaluation of acute shortness of breath that started on the morning of admission. Patient states that she wears BiPAP at night, this morning she awoke with acute SOB, gasping for breath, with oxygen sats 60s on BiPAP with heart rates of 140-150 bpm before any BD therapy. She indicates temporary relief (15 minutes) of symptoms with albuterol usage of home, after which symptoms return. However, she has been out of her Advair and Spiriva for the past several days. States she had a similar experience yesterday, with O2 sats to 85, she subsequently called 911. EMS provided albuterol nebulizer therapy, with improvement of her O2 to the low 90s.   Also admits to a preceding 1-2 day history of productive cough with scant green sputum production, diffuse muscle pain in lower extremities, headache. Confirms subjective fevers at home with temp of 100.4 F on arrival of EMS yesterday. She further confirms worsening DOE, PND, and stable 2 pillow orthopnea.Confirms DOE, confirms chronic 2 pillow orthopnea, (+) PND.   In regards to her remote PE in 03/2012, patient indicates she was continued on coumadin therapy until end of June 2013 (corroborated with [facility] records). At that time, it was  determined that likely she had adequate treatment for her unprovoked 1st VTE, and decision was also made to discontinue because pt was having difficulty to afford her coumadin, and make regular coumadin clinic appts. Her financial difficulties have also precluded pt from following regularly with her pulmonologist, Dr. Marchelle Gearing, cardiologist, Dr. Gala Romney (unclear if she has ever seen him although referred multiple times), or PCP (me).   Review of Systems: Constitutional:  admits to fever, chills, diaphoresis, decreased appetite change and fatigue.  HEENT: admits to rhinorrhea, eye pain Denies eye discharge or redness, hearing loss, ear pain, sore throat.  Respiratory: admits to SOB, DOE, cough, chest tightness, and wheezing.  Cardiovascular: admits to palpitations. Denies chest pain and leg swelling.  Gastrointestinal: denies nausea, vomiting, abdominal pain, diarrhea, constipation, blood in stool.  Genitourinary: denies dysuria, urgency, frequency, hematuria, flank pain and difficulty urinating.  Musculoskeletal: admits to myalgias. Denies back pain, joint swelling, arthralgias and gait problem.   Skin: denies pallor, rash and wound.  Neurological: admits to positional lightheadedness.  Denies dizziness, seizures, syncope, weakness, numbness and headaches.   Hematological: admits to easy bruising. Denies personal or family bleeding history.  Psychiatric: denies suicidal ideation, mood changes, sleep disturbance and agitation.    Current Outpatient Medications: Medication Sig  . albuterol (PROVENTIL HFA;VENTOLIN HFA) 108 (90 BASE) MCG/ACT  inhaler Inhale 2 puffs into the lungs every 6 (six) hours as needed. For shortness of breath  . Fluticasone-Salmeterol (ADVAIR) 250-50 MCG/DOSE AEPB Inhale 1 puff into the lungs every 12 (twelve) hours.  Marland Kitchen tiotropium (SPIRIVA) 18 MCG inhalation capsule Place 1 capsule (18 mcg total) into inhaler and inhale daily.    Allergies: No Known Allergies     Past Medical History: Past Medical History  Diagnosis Date  . COPD (chronic obstructive pulmonary disease)     PFTS 03/08/12: fev1 0.58L/1%, FVC 1.18/33%, Ratop 49 and c/w ssevere obstruction. 21% BD response on FVC, RV 219%, DLCO 11/54%  . Chronic asthma   . History of alcoholism   . Tobacco abuse     quit 02/2012  . Irregular menses   . DVT of upper extremity (deep vein thrombosis) April 2013    right subclavian // Unclear precipitating cause - possibly significant right heart failure, with vascular stasis potentially predisposing to clotting.    . Pulmonary embolus April 2013    Precipitating cause unclear. Was treated with coumadin from April-June 2013.  . Moderate to severe pulmonary hypertension April 2013    Cardiac cath on 03/06/12 - 1. Elevated pulmonary artery pressures, right sided filling pressures.,  2. PA: 64/45 (mean 53)    . OSA (obstructive sleep apnea)     wears noctural BiPAP  . Hepatic cirrhosis     Questionable history of - Noted on CT abdomen (03/2012) - thought to be due to vascular congestion from right heart failure +/- patient's history of alcohol abuse  . Axillary adenopathy     right axillary adenopathy noted on CT chest (03/06/2012)  . Periodontal disease   . Chronic right-sided CHF (congestive heart failure) 03/23/2012    with cor pulmonale. Last RHC 03/2012    Past Surgical History: Past Surgical History  Procedure Laterality Date  . Cardiac surgery    . Lung surgery      Family History: Family History  Problem Relation Age of Onset  . Hypertension Father   . Heart disease Father     CHF  . Alcohol abuse Father     Social History: History   Social History  . Marital Status: Married    Spouse Name: N/A    Number of Children: 0  . Years of Education: college   Occupational History  . Unemployed     [employer]   Social History Main Topics  . Smoking status: Former Smoker -- 1.00 packs/day for 25 years    Types: Cigarettes    Quit date:  02/18/2012  . Smokeless tobacco: Not on file     Comment: quit smoking 1 month ago  . Alcohol Use: No     Comment: previously drinking 1 pint 3-4 days a week for 2002-2013, but in 2014, no longer drinking  . Drug Use: No  . Sexually Active: Yes -- Female partner(s)    Birth Control/ Protection: None   Other Topics Concern  . Not on file   Social History Narrative   Lives with husband in [place]   Has a farm with multiple animals including chickens, dogs and cats   Completed GED and 2 years of college     Vital Signs: Blood pressure 107/75, pulse 116, temperature 99.7 F (37.6 C), temperature source Oral, resp. rate 29, last menstrual period 02/14/2013, SpO2 89.00%.  Physical Exam: General: Vital signs reviewed and noted. Well-developed, well-nourished, in no acute distress; alert, appropriate and cooperative throughout examination. Able to speak  in full sentences without increased work of breathing, however she does further desaturate to the mid 80s with prolonged speaking.   Head: Normocephalic, atraumatic.  Ears:  TM without bulging, nonerythematous bilaterally. External auditory canals without significant edema or erythema   Eyes: PERRL, EOMI, No signs of anemia or jaundince. left lateral strabismus.   Nose: Mucous membranes moist, inflammed, with yellow/green discharge, nonerythematous.  Throat: Oropharynx nonerythematous, no exudate appreciated.  Poor dentition throughout, left posterior molar with significant caries.   Neck: No deformities, masses, or tenderness noted. Supple, No carotid Bruits, no JVD.  Lungs:  Normal respiratory effort. Decreased air movement throughout all lung fields. Diffuse mild expiratory and inspiratory wheezing.  Heart: Tachycardic, but regular rhthym. S1 and S2 normal without gallop, murmur, or rubs.  Abdomen:  BS normoactive. Soft, Nondistended, non-tender.  No masses or organomegaly.  Extremities: No pretibial edema.  Neurologic: A&O X3, CN II  - XII are grossly intact. Motor strength is 5/5 in the all 4 extremities, Sensations intact to light touch, Cerebellar signs negative.  Skin: No visible rashes, scars.    Lab results:  CURRENT LABS: CBC:    Component Value Date/Time   WBC 14.2* 02/28/2013 0704   HGB 17.0* 02/28/2013 0721   HCT 50.0* 02/28/2013 0721   PLT 152 02/28/2013 0704   MCV 93.9 02/28/2013 0704   NEUTROABS 11.4* 02/28/2013 0704   LYMPHSABS 1.7 02/28/2013 0704   MONOABS 1.1* 02/28/2013 0704   EOSABS 0.0 02/28/2013 0704   BASOSABS 0.0 02/28/2013 0704     Metabolic Panel:    Component Value Date/Time   NA 135 02/28/2013 0745   K 3.3* 02/28/2013 0745   CL 93* 02/28/2013 0745   CO2 27 02/28/2013 0745   BUN 13 02/28/2013 0745   CREATININE 0.83 02/28/2013 0745   CREATININE 0.48* 04/27/2012 1456   GLUCOSE 121* 02/28/2013 0745   CALCIUM 9.0 02/28/2013 0745   AST 28 02/28/2013 0745   ALT 13 02/28/2013 0745   ALKPHOS 61 02/28/2013 0745   BILITOT 0.5 02/28/2013 0745   PROT 8.0 02/28/2013 0745   ALBUMIN 3.9 02/28/2013 0745     Recent Labs Lab 02/28/13 0745  TROPONINI 0.32*    Recent Labs Lab 02/28/13 0709  PROBNP 893.3*    HISTORICAL LABS: Lab Results  Component Value Date   TSH 4.231 03/06/2012    Imaging results:   Dg Chest Portable 1 View (02/28/2013) - Emphysema without acute cardiopulmonary disease.   Original Report Authenticated By: Andreas Newport, M.D.     Other historical results:  2D Echo (03/2012):  LV- LVEF 55% to 60%, no wall motion abnormalities ; RV- Moderately to severely dilated ; RA- Moderately dilated.   Right Heart cath (03/2012) Hemodynamic Findings:  Ao: (cuff) 111/84  LV: not performed  RA: 23  RV: 59/12/25  PA: 64/45 (mean 53)  PCWP: 18  Fick Cardiac Output: 5.0 L/min  Fick Cardiac Index: 2.76 L/min/m2  Peripheral Aortic Saturation: 90%  Pulmonary Artery Saturation: 67%    Assessment & Plan:  Pt is a 37 y.o. yo female with a PMHx of GOLD 4 COPD on home oxygen (3L via [place]),  OSA/OHS, chronic right-sided CHF/ cor pulmonale, who was admitted on 02/28/2013 with symptoms of shortness of breath, which is likely secondary to COPD exacerbation. However, with tachycardia, hypoxia, remote history of PE and mildly elevated troponins, must also include PE in the differential diagnosis - as such, will further assess while treating for both.  Principal Problem:   Acute-on-chronic  respiratory failure Active Problems:   OSA on BiPAP   Chronic right-sided CHF (congestive heart failure)   Pulmonary hypertension   COPD exacerbation   Hypokalemia   Financial difficulties   Obesity hypoventilation syndrome   Elevated troponin   Myalgia    Assessment / Plan: 1) Acute hypoxic respiratory failure - on - chronic hypercarbic respiratory failure - cause unclear, possibly precipitated by COPD exacerbation in the setting of viral URI. Recurrent pulmonary embolism is also considered in the setting of significant hypoxia on the morning of admission with oxygen saturations into the 60s, ongoing tachycardia, and mildly elevated troponin to 0.32. Volume overload in setting of noncompliance with HF medications could also partially explain symptoms, although lung and extremity exam does not suggest this. ACS must also be further evaluated given mild troponin elevation, and likely demand ischemia in setting of tachycardia, respiratory failure. Otherwise, CXR negative for evidence of PNA, PTX, significant pulmonary edema.  Plan: - Admit to telemetry unit - qHS BiPAP - Continue O2 supplemental therapy with goal > 88% - IV Solumedrol 80mg  q12h - Scheduled Xoponex/ Atrovent - Start doxycycline soon. - Will obtain CTA chest to evaluate for PE. - Full dose lovenox administered x1 in ED. - Dependent on CTA results, will either continue full dose Lovenox and provide coumadin OR will consider to start heparin. - Check Flu PCR  2) Hypokalemia - likely 2/2 excessive albuterol usage. - KDur 40 mEq x  2 - Check Mg.  3) Elevated troponin - Likely demand ischemia in setting of tachycardia, acute respiratory failure. Alternatively, may see this mild elevation in setting of PE. Otherwise, does not have AKI to explain.  - Full dose lovenox administered in ED - As above, dependent on CTA results, will either continue full dose Lovenox and provide coumadin OR will consider to start heparin. - Continue to cycle CE - AM EKG - Administer Aspirin 325 mg x 1, then daily 81mg  - Start statin, no BB acutely in setting of COPD exac, - Check TSH, lipid panel, A1c  4) COPD exacerbation - on home 3L O2 per [place]. Likely 2/2 viral URI. She is also unable to afford current home medications, Advair and Spiriva not on formulary, ran out this week. Given severity of disease, must have these medications - she has not looked into formulary alternatives.  - Treat as per #1. - Will ask SW to assess if [facility] candidate. - Will need to speak with her pharmacy to assess formulary alternatives.  5) Myalgias - likely 2/2 viral illness, given no renal failure noted, rhabdomyolysis unlikely. - Will check UA for blood, CK/CK MB - Also, will check Flu PCR given respiratory and myalgia symptoms  6) Chronic right-sided CHF (congestive heart failure) / Pulmonary hypertension / cor pulmonale without acute exacerbation - at present is not on any heart failure medications. On record review, was at some point on Torsemide and spironolactone (although cannot easily identify when these were started/ discontinued). She was also supposed to followup with Dr. Gala Romney for continued management of her pulmonary hypertension, without adequate follow-up.  - Needs to be counseled on heart failure management. - Repeat 2D echocardiogram - Daily weights. - Strict I/O - Obtain weight now, to assess change from baseline. - Consider HF team consult, after initial workup.  7) OSA on BiPAP / Obesity hypoventilation syndrome - qHS BiPAP - Continue  to encourage weight loss.  8) Financial difficulties - patient indicates difficulty with transportation (unable to afford gas to come to  visits) and difficulty affording her medications. States she is unable to qualify for disability or other assistance secondary to husbands employment. - SW consult to assess help with transportation needs, assess qualification for [facility] services, any additional financial resources.    DVT PPX - low molecular weight heparin  CODE STATUS - FULL  CONSULTS PLACED - None (Follows with [facility], Dr. Marchelle Gearing if pulm consult is needed)  DISPO - Disposition is deferred at this time, awaiting improvement of respiratory status.   Anticipated discharge in approximately 2-3 day(s).   The patient does have a current PCP (Helen Winterhalter S Kalia-Reynolds, DO) and does need an [facility] hospital follow-up appointment after discharge.    Is the [facility] hospital follow-up appointment a one-time only appointment? no.  Does the patient have transportation limitations that hinder transportation to clinic appointments? yes.   Signed: Priscella Mann, DO  PGY-3, Internal Medicine Resident 02/28/2013, 10:10 AM

## 2013-02-28 NOTE — ED Notes (Signed)
Results of i-stat troponin given to Dr. Lockwood. 

## 2013-02-28 NOTE — ED Notes (Signed)
EMS called out to house for C/O SOB over last 2 days. Sats originally 60's on Bipap lying flat on her back when [employer]. Arrived by time EMS arrived she was sitting up with NRB and then given Albuterol neb. 5.

## 2013-02-28 NOTE — Progress Notes (Signed)
Pt placed on auto mode Bipap due to pt not knowing settings. Pt tolerating settings well at this time. Pt encouraged to call RT if needed.

## 2013-02-28 NOTE — Progress Notes (Signed)
S:  Patient with sputum with green to bubbly white color (but not as thick as normal), increased cough (x 3 days), increase sob, denies sick contacts but has been exposed to small children.  It is hurting her to cough.  She is having subjective fever and muscles aches, decreased appetite x 2 days, denies sore throat.  Chronic horse voice.  She states this am her pulse ox was 55% and HR was 150.  Yesterday EMS gave her Albuterol treatments x 2 for sob and her HR was 140.    O:  General: lying in bed,nad, alert and oriented x 3 HEENT: [place]/at CV: ST, no murmurs  Lungs: ctab, coughing on 6L O2 Abdomen: soft, ntnd, nl bs Extremities: no cyanosis or edema, warm Neuro: moving all 4 extremities, alert and oriented x 3   A/P  1. Acute on chronic hypoxic respiratory failure with chronic hypercarbic respiratory failure.  2/2 COPD exacerbation likely 2/2 Influenza +.  Hx OHS/OSA.  Also medication noncompliant x a couple of days.   -droplet precautions  -started Tamiflu.  Steroids. Albuterol/Xopenex nebulizer. Given Vancomycin/Zosyn in the ED now on Doxycycline 100 q12 -hold Advair and Spiriva.  Will order [facility]. Mucinex  -Currently on 6 L [place]. Baseline is 3 L.  Will try to titrate down O2  -qhs bipap  2. History of chronic CHF due to cor pulmonale -echo mild LVH; EF 60% -strict i/o; daily weights  3. Sinus tachycardia  -may be 2/2 to recent increased use of Albuterol (used Albuterol 7-8 x last night), infection -will monitor via tele -Pt has h/o PE.  CTA to r/o PE negative  4. Elevated troponin  -Trended to 0.32.  Will continue to trend -Maybe elevated in acute illness vs tachycardiac or respiratory failure.  CTA negative for PE   5. Orthostatic vital signs  -will encourage oral intake -repeat orthostatics in the am   6. HypoK (3.3) -likely 2/2 BB use  -Replaced in the ED.  Repeat in am   7. Myalgias -given Toradol 30 mg iv x 1    Shirlee Latch MD [phone]

## 2013-02-28 NOTE — ED Provider Notes (Signed)
History     CSN: [id number]  Arrival date & time 02/28/13  8295   First MD Initiated Contact with Patient 02/28/13 [phone]      Chief Complaint  Patient presents with  . Shortness of Breath    (Consider location/radiation/quality/duration/timing/severity/associated sxs/prior treatment) HPI History provided by patient. Has severe COPD, as on 3 L oxygen all the time and uses BiPAP at night. Over the last 24-48 hours has been having increased shortness of breath, but poorly last night and this morning on her portable pulse ox, she measured 55-60% became concerned and called EMS. She has felt feverish this morning. Has ongoing cough without productive sputum. No leg pain or leg swelling. Her physician has had her off of her diuretic for last 2 months. She is taking medications as prescribed. She is no longer on anticoagulation with previous history of PE. She received albuterol in route with EMS reported hypoxia Past Medical History  Diagnosis Date  . COPD (chronic obstructive pulmonary disease)     PFTS 03/08/12: fev1 0.58L/1%, FVC 1.18/33%, Ratop 49 and c/w ssevere obstruction. 21% BD response on FVC, RV 219%, DLCO 11/54%  . Chronic asthma   . History of alcoholism   . Tobacco abuse   . Irregular menses   . DVT of upper extremity (deep vein thrombosis) April 2013    right subclavian // Unclear precipitating cause - possibly significant right heart failure, with vascular stasis potentially predisposing to clotting.    . Pulmonary embolus April 2013    plan for at least 6 months of anticoagulation  . Moderate to severe pulmonary hypertension April 2013    Cardiac cath on 03/06/12 - 1. Elevated pulmonary artery pressures, right sided filling pressures.,  2. PA: 64/45 (mean 53)    . OSA (obstructive sleep apnea)     wears noctural BiPAP  . Hepatic cirrhosis     Noted on CT abdomen (03/2012) - thought to be due to vascular congestion from right heart failure +/- patient's history of alcohol abuse   . Axillary adenopathy     right axillary adenopathy noted on CT chest (03/06/2012)  . Anticoagulation goal of INR 2 to 3     Plan to treat at least 6 months for Right subclavian DVTand Right middle lung PE noted on 03/06/2012     Past Surgical History  Procedure Laterality Date  . Cardiac surgery    . Lung surgery      Family History  Problem Relation Age of Onset  . Hypertension Father   . Heart disease Father     CHF  . Alcohol abuse Father     History  Substance Use Topics  . Smoking status: Former Smoker -- 1.00 packs/day for 25 years    Types: Cigarettes    Quit date: 02/18/2012  . Smokeless tobacco: Not on file     Comment: quit smoking 1 month ago  . Alcohol Use: No     Comment: One drink per week    OB History   Grav Para Term Preterm Abortions TAB SAB Ect Mult Living                  Review of Systems  Constitutional: Negative for fever and chills.  HENT: Negative for neck pain and neck stiffness.   Eyes: Negative for pain.  Respiratory: Positive for shortness of breath and wheezing.   Cardiovascular: Negative for chest pain.  Gastrointestinal: Negative for abdominal pain.  Genitourinary: Negative for dysuria.  Musculoskeletal:  Negative for back pain.  Skin: Negative for rash.  Neurological: Negative for headaches.  All other systems reviewed and are negative.    Allergies  Review of patient's allergies indicates no known allergies.  Home Medications   Current Outpatient Rx  Name  Route  Sig  Dispense  Refill  . albuterol (PROVENTIL HFA;VENTOLIN HFA) 108 (90 BASE) MCG/ACT inhaler   Inhalation   Inhale 2 puffs into the lungs every 6 (six) hours as needed. For shortness of breath   1 Inhaler   3   . Fluticasone-Salmeterol (ADVAIR) 250-50 MCG/DOSE AEPB   Inhalation   Inhale 1 puff into the lungs every 12 (twelve) hours.         . moxifloxacin (AVELOX) 400 MG tablet   Oral   Take 1 tablet (400 mg total) by mouth daily. One tab daily    10 tablet   0   . spironolactone (ALDACTONE) 25 MG tablet   Oral   Take 1 tablet (25 mg total) by mouth daily.   30 tablet   6   . tiotropium (SPIRIVA) 18 MCG inhalation capsule   Inhalation   Place 1 capsule (18 mcg total) into inhaler and inhale daily.   30 capsule   5   . torsemide (DEMADEX) 20 MG tablet   Oral   Take 20 mg by mouth as needed.           BP 121/76  Pulse 143  Temp(Src) 99.7 F (37.6 C) (Oral)  Resp 16  SpO2 89%  Physical Exam  Constitutional: She is oriented to person, place, and time. She appears well-developed and well-nourished.  HENT:  Head: Normocephalic and atraumatic.  Eyes: EOM are normal. Pupils are equal, round, and reactive to light.  Neck: Neck supple.  Cardiovascular: Regular rhythm and intact distal pulses.   Tachycardic 140s  Pulmonary/Chest: No stridor. She exhibits no tenderness.  Tachypnea with bilaterally decreased breath sounds and coarse expiratory lung noises.  Abdominal: Soft. Bowel sounds are normal.  Musculoskeletal: Normal range of motion. She exhibits no tenderness.  Calves are nontender with minimal lower extremity peripheral edema  Neurological: She is alert and oriented to person, place, and time.  Skin: Skin is warm and dry. No pallor.    ED Course  Procedures (including critical care time)   Date: 02/28/2013  Rate: 139  Rhythm: sinus tachycardia  QRS Axis: right  Intervals: normal  ST/T Wave abnormalities: nonspecific ST/T changes  Conduction Disutrbances:none  Narrative Interpretation:   Old EKG Reviewed: changes noted previous EKG demonstrates normal sinus at rate of 89  IV Solu-Medrol. Albuterol nebulizers. Requiring 6 L of oxygen to keep saturations between 89 and 91%. IV antibiotics for reported fevers in the setting of worsening respiratory status with severe COPD   MDM  COPD exacerbation  Evaluated with EKG, labs, chest x-ray, ABG  New oxygen requirement with reported fevers given IV  antibiotics  Medical consult admission        Sunnie Nielsen, MD 03/01/13 0710

## 2013-02-28 NOTE — ED Provider Notes (Signed)
Care of patient assumed at shift change.  Initial POC trop 0.09.  VS remain abnormal w HR >120, and patient is requiring additional O2 to maintain sat's in low 90's.  8:41 AM Trop 0.32. Patient reassessed.  She states that she feels better. HR 125. O2- 90% w supplemental O2 She adds that she is not anti-coagulated (for several months)  She will receive lovenox for elevated trop, ongoing chest discomfort, elevated trop.  CTA negative  Gerhard Munch, MD 02/28/13 1537

## 2013-02-28 NOTE — ED Notes (Signed)
Jeraldine Loots, MD notified re: 0.32 Troponin

## 2013-03-01 ENCOUNTER — Encounter (HOSPITAL_COMMUNITY): Payer: 59

## 2013-03-01 DIAGNOSIS — R000 Tachycardia, unspecified: Secondary | ICD-10-CM

## 2013-03-01 DIAGNOSIS — J9600 Acute respiratory failure, unspecified whether with hypoxia or hypercapnia: Secondary | ICD-10-CM

## 2013-03-01 LAB — LIPID PANEL
Cholesterol: 190 mg/dL (ref 0–200)
HDL: 49 mg/dL (ref >39)
Total CHOL/HDL Ratio: 3.9 RATIO

## 2013-03-01 LAB — CBC
Hemoglobin: 14.8 g/dL (ref 12.0–15.0)
MCH: 31.4 pg (ref 26.0–34.0)
MCV: 93.6 fL (ref 78.0–100.0)
Platelets: 160 10*3/uL (ref 150–400)
RBC: 4.71 MIL/uL (ref 3.87–5.11)
WBC: 13.6 10*3/uL — ABNORMAL HIGH (ref 4.0–10.5)

## 2013-03-01 LAB — URINE MICROSCOPIC-ADD ON

## 2013-03-01 LAB — URINALYSIS, ROUTINE W REFLEX MICROSCOPIC
Glucose, UA: 100 mg/dL — AB
Ketones, ur: NEGATIVE mg/dL
Protein, ur: 100 mg/dL — AB
pH: 5.5 (ref 5.0–8.0)

## 2013-03-01 LAB — URINE CULTURE

## 2013-03-01 LAB — BASIC METABOLIC PANEL
CO2: 26 mEq/L (ref 19–32)
Chloride: 95 mEq/L — ABNORMAL LOW (ref 96–112)
Glucose, Bld: 169 mg/dL — ABNORMAL HIGH (ref 70–99)
Potassium: 4.6 mEq/L (ref 3.5–5.1)
Sodium: 133 mEq/L — ABNORMAL LOW (ref 135–145)

## 2013-03-01 LAB — GLUCOSE, CAPILLARY: Glucose-Capillary: 168 mg/dL — ABNORMAL HIGH (ref 70–99)

## 2013-03-01 MED ORDER — IPRATROPIUM BROMIDE 0.02 % IN SOLN
0.5000 mg | Freq: Once | RESPIRATORY_TRACT | Status: AC
  Administered 2013-03-01: 0.5 mg via RESPIRATORY_TRACT
  Filled 2013-03-01: qty 2.5

## 2013-03-01 MED ORDER — ENOXAPARIN SODIUM 40 MG/0.4ML ~~LOC~~ SOLN
40.0000 mg | SUBCUTANEOUS | Status: DC
  Administered 2013-03-01 – 2013-03-02 (×2): 40 mg via SUBCUTANEOUS
  Filled 2013-03-01 (×2): qty 0.4

## 2013-03-01 MED ORDER — BENZONATATE 100 MG PO CAPS
100.0000 mg | ORAL_CAPSULE | Freq: Three times a day (TID) | ORAL | Status: DC | PRN
  Administered 2013-03-01 – 2013-03-02 (×4): 100 mg via ORAL
  Filled 2013-03-01 (×5): qty 1

## 2013-03-01 MED ORDER — LEVALBUTEROL HCL 0.63 MG/3ML IN NEBU
0.6300 mg | INHALATION_SOLUTION | Freq: Once | RESPIRATORY_TRACT | Status: AC
  Administered 2013-03-01: 0.63 mg via RESPIRATORY_TRACT
  Filled 2013-03-01: qty 3

## 2013-03-01 NOTE — H&P (Signed)
Internal Medicine Teaching Service Attending Note Date: 03/01/2013  Patient name: Mary [name]  Medical record number: [id number]  Date of birth: [birth year]   I have seen and evaluated Mary [name] and discussed their care with the Residency Team.    Mary [name] is a 36yo woman with PMH of GOLD stage 4 COPD on home oxygen, OSA/OHS, RHF/cor pulmonale who presented to the [facility] for evaluation of SOB and low pulse ox.  Mary [name] reports that on the morning of admission, she awoke with acute SOB with gasping and checked her O2 sats which were 60.  She wears Bipap at baseline, and she did wear it that evening.  She also noted that her pulse was 145.  She attempted her inhaler, but only had relief from her symptoms for a few minutes before they returned.  She has been out of her advair and spiriva for a few days, but she has been taking a friends albuterol.  She reports having a similar episode on the day prior to admission, she was evaluated by EMS, given breathing treatments, but they did not feel she needed to come to the hospital.   Associated symptoms have included productive cough, chest pain with cough, myalgias, headache and subjective fevers.  She further notes worsening of her baseline symptoms of DOE, PND and orthopnea.   She has a history of PE for which she was treated, please see Dr. Saralyn Pilar note for details. She further has sinus tachycardia which has not been fully explained.   For further medical history, please see Resident note.   Physical Exam: Blood pressure 110/70, pulse 94, temperature 97.8 F (36.6 C), temperature source Oral, resp. rate 18, height 5\' 2"  (1.575 m), weight 174 lb 4.8 oz (79.062 kg), last menstrual period 02/14/2013, SpO2 93.00%. General appearance: alert, cooperative, appears stated age and no distress Head: Normocephalic, without obvious abnormality, atraumatic Eyes: EOMI, anicteric sclerae Lungs: course breath sounds throughout, worse in the bases,  no wheezing, [place] in place Heart: difficult to appreciate heart sounds due to course breath sounds, RR, no appreciated murmur Abdomen: soft, NT Extremities: minimal trace edema, NT Pulses: 2+ and symmetric Skin: no rash, + well healed scar to left upper back  Lab results: Results for orders placed during the hospital encounter of 02/28/13 (from the past 24 hour(s))  CBC     Status: Abnormal   Collection Time    02/28/13  7:04 AM      Result Value Range   WBC 14.2 (*) 4.0 - 10.5 K/uL   RBC 4.89  3.87 - 5.11 MIL/uL   Hemoglobin 15.7 (*) 12.0 - 15.0 g/dL   HCT 69.6  29.5 - 28.4 %   MCV 93.9  78.0 - 100.0 fL   MCH 32.1  26.0 - 34.0 pg   MCHC 34.2  30.0 - 36.0 g/dL   RDW 13.2  44.0 - 10.2 %   Platelets 152  150 - 400 K/uL  DIFFERENTIAL     Status: Abnormal   Collection Time    02/28/13  7:04 AM      Result Value Range   Neutrophils Relative 80 (*) 43 - 77 %   Neutro Abs 11.4 (*) 1.7 - 7.7 K/uL   Lymphocytes Relative 12  12 - 46 %   Lymphs Abs 1.7  0.7 - 4.0 K/uL   Monocytes Relative 8  3 - 12 %   Monocytes Absolute 1.1 (*) 0.1 - 1.0 K/uL   Eosinophils Relative 0  0 -  5 %   Eosinophils Absolute 0.0  0.0 - 0.7 K/uL   Basophils Relative 0  0 - 1 %   Basophils Absolute 0.0  0.0 - 0.1 K/uL  PRO B NATRIURETIC PEPTIDE     Status: Abnormal   Collection Time    02/28/13  7:09 AM      Result Value Range   Pro B Natriuretic peptide (BNP) 893.3 (*) 0 - 125 pg/mL  POCT I-STAT TROPONIN I     Status: Abnormal   Collection Time    02/28/13  7:19 AM      Result Value Range   Troponin i, poc 0.09 (*) 0.00 - 0.08 ng/mL   Comment NOTIFIED PHYSICIAN     Comment 3           POCT I-STAT, CHEM 8     Status: Abnormal   Collection Time    02/28/13  7:21 AM      Result Value Range   Sodium 134 (*) 135 - 145 mEq/L   Potassium 3.4 (*) 3.5 - 5.1 mEq/L   Chloride 97  96 - 112 mEq/L   BUN 13  6 - 23 mg/dL   Creatinine, Ser 1.61  0.50 - 1.10 mg/dL   Glucose, Bld 096 (*) 70 - 99 mg/dL   Calcium, Ion  0.45  4.09 - 1.23 mmol/L   TCO2 29  0 - 100 mmol/L   Hemoglobin 17.0 (*) 12.0 - 15.0 g/dL   HCT 81.1 (*) 91.4 - 78.2 %  POCT I-STAT 3, BLOOD GAS (G3+)     Status: Abnormal   Collection Time    02/28/13  7:36 AM      Result Value Range   pH, Arterial 7.320 (*) 7.350 - 7.450   pCO2 arterial 49.1 (*) 35.0 - 45.0 mmHg   pO2, Arterial 75.0 (*) 80.0 - 100.0 mmHg   Bicarbonate 25.3 (*) 20.0 - 24.0 mEq/L   TCO2 27  0 - 100 mmol/L   O2 Saturation 93.0     Acid-base deficit 2.0  0.0 - 2.0 mmol/L   Collection site RADIAL, ALLEN'S TEST ACCEPTABLE     Drawn by RT     Sample type ARTERIAL    COMPREHENSIVE METABOLIC PANEL     Status: Abnormal   Collection Time    02/28/13  7:45 AM      Result Value Range   Sodium 135  135 - 145 mEq/L   Potassium 3.3 (*) 3.5 - 5.1 mEq/L   Chloride 93 (*) 96 - 112 mEq/L   CO2 27  19 - 32 mEq/L   Glucose, Bld 121 (*) 70 - 99 mg/dL   BUN 13  6 - 23 mg/dL   Creatinine, Ser 9.56  0.50 - 1.10 mg/dL   Calcium 9.0  8.4 - 21.3 mg/dL   Total Protein 8.0  6.0 - 8.3 g/dL   Albumin 3.9  3.5 - 5.2 g/dL   AST 28  0 - 37 U/L   ALT 13  0 - 35 U/L   Alkaline Phosphatase 61  39 - 117 U/L   Total Bilirubin 0.5  0.3 - 1.2 mg/dL   GFR calc non Af Amer >90  >90 mL/min   GFR calc Af Amer >90  >90 mL/min  TROPONIN I     Status: Abnormal   Collection Time    02/28/13  7:45 AM      Result Value Range   Troponin I 0.32 (*) <0.30 ng/mL  PROTIME-INR  Status: None   Collection Time    02/28/13  7:45 AM      Result Value Range   Prothrombin Time 12.4  11.6 - 15.2 seconds   INR 0.93  0.00 - 1.49  CK TOTAL AND CKMB     Status: Abnormal   Collection Time    02/28/13  7:45 AM      Result Value Range   Total CK 352 (*) 7 - 177 U/L   CK, MB 3.0  0.3 - 4.0 ng/mL   Relative Index 0.9  0.0 - 2.5  MAGNESIUM     Status: None   Collection Time    02/28/13  9:15 AM      Result Value Range   Magnesium 1.9  1.5 - 2.5 mg/dL  GLUCOSE, CAPILLARY     Status: Abnormal   Collection Time     02/28/13 10:58 AM      Result Value Range   Glucose-Capillary 191 (*) 70 - 99 mg/dL   Comment 1 Documented in Chart     Comment 2 Notify RN    PREGNANCY, URINE     Status: None   Collection Time    02/28/13 11:30 AM      Result Value Range   Preg Test, Ur NEGATIVE  NEGATIVE  URINALYSIS, ROUTINE W REFLEX MICROSCOPIC     Status: Abnormal   Collection Time    02/28/13 11:30 AM      Result Value Range   Color, Urine YELLOW  YELLOW   APPearance CLOUDY (*) CLEAR   Specific Gravity, Urine 1.015  1.005 - 1.030   pH 5.5  5.0 - 8.0   Glucose, UA NEGATIVE  NEGATIVE mg/dL   Hgb urine dipstick MODERATE (*) NEGATIVE   Bilirubin Urine SMALL (*) NEGATIVE   Ketones, ur 15 (*) NEGATIVE mg/dL   Protein, ur 161 (*) NEGATIVE mg/dL   Urobilinogen, UA 1.0  0.0 - 1.0 mg/dL   Nitrite NEGATIVE  NEGATIVE   Leukocytes, UA NEGATIVE  NEGATIVE  URINE MICROSCOPIC-ADD ON     Status: Abnormal   Collection Time    02/28/13 11:30 AM      Result Value Range   Squamous Epithelial / LPF MANY (*) RARE   WBC, UA 3-6  <3 WBC/hpf   Bacteria, UA FEW (*) RARE   Casts GRANULAR CAST (*) NEGATIVE  INFLUENZA PANEL BY PCR     Status: Abnormal   Collection Time    02/28/13 11:46 AM      Result Value Range   Influenza A By PCR POSITIVE (*) NEGATIVE   Influenza B By PCR NEGATIVE  NEGATIVE   H1N1 flu by pcr DETECTED (*) NOT DETECTED  CULTURE, EXPECTORATED SPUTUM-ASSESSMENT     Status: None   Collection Time    02/28/13  4:00 PM      Result Value Range   Specimen Description SPUTUM     Special Requests Normal     Sputum evaluation       Value: THIS SPECIMEN IS ACCEPTABLE. RESPIRATORY CULTURE REPORT TO FOLLOW.   Report Status 02/28/2013 FINAL    GLUCOSE, CAPILLARY     Status: Abnormal   Collection Time    02/28/13  4:31 PM      Result Value Range   Glucose-Capillary 180 (*) 70 - 99 mg/dL   Comment 1 Documented in Chart     Comment 2 Notify RN    COMPREHENSIVE METABOLIC PANEL     Status: Abnormal   Collection  Time    02/28/13  7:05 PM      Result Value Range   Sodium 133 (*) 135 - 145 mEq/L   Potassium 4.3  3.5 - 5.1 mEq/L   Chloride 95 (*) 96 - 112 mEq/L   CO2 27  19 - 32 mEq/L   Glucose, Bld 321 (*) 70 - 99 mg/dL   BUN 23  6 - 23 mg/dL   Creatinine, Ser 1.61  0.50 - 1.10 mg/dL   Calcium 9.3  8.4 - 09.6 mg/dL   Total Protein 7.7  6.0 - 8.3 g/dL   Albumin 3.6  3.5 - 5.2 g/dL   AST 29  0 - 37 U/L   ALT 14  0 - 35 U/L   Alkaline Phosphatase 53  39 - 117 U/L   Total Bilirubin 0.3  0.3 - 1.2 mg/dL   GFR calc non Af Amer 66 (*) >90 mL/min   GFR calc Af Amer 77 (*) >90 mL/min  TROPONIN I     Status: None   Collection Time    02/28/13  7:06 PM      Result Value Range   Troponin I <0.30  <0.30 ng/mL  CK TOTAL AND CKMB     Status: Abnormal   Collection Time    02/28/13  7:06 PM      Result Value Range   Total CK 470 (*) 7 - 177 U/L   CK, MB 5.1 (*) 0.3 - 4.0 ng/mL   Relative Index 1.1  0.0 - 2.5  GLUCOSE, CAPILLARY     Status: Abnormal   Collection Time    02/28/13  9:06 PM      Result Value Range   Glucose-Capillary 259 (*) 70 - 99 mg/dL   Comment 1 Notify RN    TROPONIN I     Status: None   Collection Time    03/01/13 12:48 AM      Result Value Range   Troponin I <0.30  <0.30 ng/mL  LIPID PANEL     Status: Abnormal   Collection Time    03/01/13  5:35 AM      Result Value Range   Cholesterol 190  0 - 200 mg/dL   Triglycerides 045 (*) <150 mg/dL   HDL 49  >40 mg/dL   Total CHOL/HDL Ratio 3.9     VLDL 30  0 - 40 mg/dL   LDL Cholesterol 981 (*) 0 - 99 mg/dL  BASIC METABOLIC PANEL     Status: Abnormal   Collection Time    03/01/13  5:35 AM      Result Value Range   Sodium 133 (*) 135 - 145 mEq/L   Potassium 4.6  3.5 - 5.1 mEq/L   Chloride 95 (*) 96 - 112 mEq/L   CO2 26  19 - 32 mEq/L   Glucose, Bld 169 (*) 70 - 99 mg/dL   BUN 31 (*) 6 - 23 mg/dL   Creatinine, Ser 1.91  0.50 - 1.10 mg/dL   Calcium 9.3  8.4 - 47.8 mg/dL   GFR calc non Af Amer 67 (*) >90 mL/min   GFR calc  Af Amer 78 (*) >90 mL/min  CBC     Status: Abnormal   Collection Time    03/01/13  5:35 AM      Result Value Range   WBC 13.6 (*) 4.0 - 10.5 K/uL   RBC 4.71  3.87 - 5.11 MIL/uL   Hemoglobin 14.8  12.0 - 15.0 g/dL  HCT 44.1  36.0 - 46.0 %   MCV 93.6  78.0 - 100.0 fL   MCH 31.4  26.0 - 34.0 pg   MCHC 33.6  30.0 - 36.0 g/dL   RDW 16.1  09.6 - 04.5 %   Platelets 160  150 - 400 K/uL  TROPONIN I     Status: None   Collection Time    03/01/13  5:35 AM      Result Value Range   Troponin I <0.30  <0.30 ng/mL    Imaging results:  Dg Chest 2 View  02/28/2013  *RADIOLOGY REPORT*  Clinical Data: Cough, hypoxia, COPD  CHEST - 2 VIEW  Comparison: 02/28/2013  Findings: Cardiomediastinal silhouette is stable.  No acute infiltrate or pleural effusion.  No pulmonary edema.  Stable hyperinflation and chronic mild interstitial prominence.  Bony thorax is unremarkable.  IMPRESSION: No active disease.  Stable hyperinflation and chronic mild interstitial prominence.   Original Report Authenticated By: Natasha Mead, M.D.    Ct Angio Chest Pe W/cm &/or Wo Cm  02/28/2013  *RADIOLOGY REPORT*  Clinical Data: Shortness of breath.  CT ANGIOGRAPHY CHEST  Technique:  Multidetector CT imaging of the chest using the standard protocol during bolus administration of intravenous contrast. Multiplanar reconstructed images including MIPs were obtained and reviewed to evaluate the vascular anatomy.  Contrast: OMNIPAQUE IOHEXOL 350 MG/ML SOLN  Comparison: 03/06/2012.  Findings: Image quality is degraded by motion.  No definite pulmonary embolus.  No pathologically enlarged mediastinal lymph nodes.  There is bihilar lymphoid tissue with a right hilar lymph node measuring up to 1.5 cm, similar to prior.  No axillary adenopathy.  Pulmonary arteries are enlarged.  Heart size normal. No pericardial effusion.  Again, respiratory motion degrades image quality.  There is scattered peribronchovascular nodularity, as before, suggesting a  post infectious etiology.  There is a mosaic attenuation are seen bilaterally.  No pleural fluid.  Airway is unremarkable.  Incidental imaging of the upper abdomen shows no acute findings. No worrisome lytic or sclerotic lesions.  IMPRESSION:  1.  Image quality is degraded by respiratory motion.  No definite pulmonary embolus. 2.  No acute findings to explain the patient's shortness of breath. 3.  Mosaic pulmonary parenchymal attenuation can be seen with small airways disease. 4.  Pulmonary arterial hypertension.   Original Report Authenticated By: Leanna Battles, M.D.    Dg Chest Portable 1 View  02/28/2013  *RADIOLOGY REPORT*  Clinical Data: Short of breath.  Cough.  COPD.  PORTABLE CHEST - 1 VIEW  Comparison: 04/12/2012.  Findings: Hyperinflation is present consist with emphysema. Bilateral basilar atelectasis.  No definite airspace disease or edema.  Scarring is present near the cardiac apex. Cardiopericardial silhouette is within normal limits in appears similar to the prior exam. Monitoring leads are projected over the chest. No focal consolidation.  Exam is under penetrated at the bases.  IMPRESSION: Emphysema without acute cardiopulmonary disease.   Original Report Authenticated By: Andreas Newport, M.D.     Assessment and Plan: I agree with the formulated Assessment and Plan with the following changes:   Influenza with concomitant Acute hypoxic respiratory failure, on chronic hypercarbic RF -   Likely her RF is related to influenza - She will be treated with tamiflu, doxycycline (for acute COPD exacerbation), steroid, scheduled nebs - Bipap at night - Droplet precautions - There was concern for a PE, CTA was not consistent, but it was a poor quality study  Hypokalemia - Improved, continue supplementation while  receiving high doses of nebulizer therapy  Elevated troponin - Trending down, likely due to demand in setting of acute respiratory illness.  Continue to monitor for any chest pain,  signs of ACS  Chronic right sided CHF with cor pulmonale - Thought to be contributing to her acute picture, but seems now to be worsening of COPD in the setting of influenza, regardless, will repeat a 2D echo, continue daily weights and monitor closely for any worsening.   Other issues as per resident note.    Inez Catalina, MD 3/27/20147:01 AM

## 2013-03-01 NOTE — Progress Notes (Signed)
Inpatient Diabetes Program Recommendations  AACE/ADA: New Consensus Statement on Inpatient Glycemic Control (2013)  Target Ranges:  Prepandial:   less than 140 mg/dL      Peak postprandial:   less than 180 mg/dL (1-2 hours)      Critically ill patients:  140 - 180 mg/dL   Reason for Visit: Hyperglycemia Results for Mary [name], Mary [name] (MRN [id number]) as of 03/01/2013 16:10  Ref. Range 02/28/2013 16:31 02/28/2013 21:06 03/01/2013 07:07  Glucose-Capillary Latest Range: 70-99 mg/dL 409 (H) 811 (H) 914 (H)     Inpatient Diabetes Program Recommendations HgbA1C: Check HgbA1C to assess glycemic control prior to hospitalization  Note: Will follow.  Thank you. Ailene Ards, RD, LDN, CDE Inpatient Diabetes Coordinator [phone]

## 2013-03-01 NOTE — Progress Notes (Addendum)
Called by nursing that after the patient came off the bipap around 5:30am, she was SOB and was having chest tightness. She has Duonebs scheduled but none PRN; he was requesting a breathing treatment. I was paged around 5:55am and went to the bedside to evaluate the patient.  On arrival to the bedside, the patient was sitting up in bed, on 4L [place], and was short of breath while speaking with mild respiratory distress. She was unable to carry on a conversation without her O2 saturation dropping into the mid 80s. Her O2 saturation did improve to the mid 90s when she refrained from talking.   On exam, she had limited air movement in her lungs, but exam was otherwise WNL>  Starting Duoneb treatment with Xopenex and Atrovent. Hopefully this will help open up her lungs. At this time, I do not believe she requires further intervention, but she will need to be closely watched.

## 2013-03-01 NOTE — Progress Notes (Signed)
UR Chart Review Completed  

## 2013-03-01 NOTE — Progress Notes (Signed)
Subjective: Patient had chest tightness this am which is decreased.  She is doing well on 4L O2.  She is weak, still with muscles aches, nausea has decreased.  She continues to cough though is not able to bring contents up.  Her appetite improved this am.  Overall feeling better.    Overnight: patient was sob, c/o chest tightness, desat to 80s then came back up to 90s post Duoneb  Objective: Vital signs in last 24 hours: Filed Vitals:   02/28/13 1710 02/28/13 2136 03/01/13 0142 03/01/13 0600  BP:  134/86 124/78 110/70  Pulse:  94 95 94  Temp:  98.3 F (36.8 C) 98.2 F (36.8 C) 97.8 F (36.6 C)  TempSrc:  Oral Oral Oral  Resp:  22 22 18   Height:      Weight:    171 lb 8.3 oz (77.8 kg)  SpO2: 96% 93%  93%   Weight change:   Intake/Output Summary (Last 24 hours) at 03/01/13 0837 Last data filed at 03/01/13 0300  Gross per 24 hour  Intake    460 ml  Output      0 ml  Net    460 ml   General: lying in bed, nad, alert and oriented x3  HEENT: [place]/at  CV: RRR no rubs, murmurs of gallops  Lungs: ctab, intermittently coughing Abdomen: soft, ntnd, nl bs  Extremities: no cyanosis or edema, warms Skin: ecchymoses to arms  Neuro: moving all 4 extremities, alert and oriented x 3   Lab Results: Basic Metabolic Panel:  Recent Labs Lab 02/28/13 0915 02/28/13 1905 03/01/13 0535  NA  --  133* 133*  K  --  4.3 4.6  CL  --  95* 95*  CO2  --  27 26  GLUCOSE  --  321* 169*  BUN  --  23 31*  CREATININE  --  1.07 1.06  CALCIUM  --  9.3 9.3  MG 1.9  --   --    Liver Function Tests:  Recent Labs Lab 02/28/13 0745 02/28/13 1905  AST 28 29  ALT 13 14  ALKPHOS 61 53  BILITOT 0.5 0.3  PROT 8.0 7.7  ALBUMIN 3.9 3.6   CBC:  Recent Labs Lab 02/28/13 0704 02/28/13 0721 03/01/13 0535  WBC 14.2*  --  13.6*  NEUTROABS 11.4*  --   --   HGB 15.7* 17.0* 14.8  HCT 45.9 50.0* 44.1  MCV 93.9  --  93.6  PLT 152  --  160   Cardiac Enzymes:  Recent Labs Lab 02/28/13 0745  02/28/13 1906 03/01/13 0048 03/01/13 0535  CKTOTAL 352* 470*  --   --   CKMB 3.0 5.1*  --   --   TROPONINI 0.32* <0.30 <0.30 <0.30   BNP:  Recent Labs Lab 02/28/13 0709  PROBNP 893.3*   CBG:  Recent Labs Lab 02/28/13 1058 02/28/13 1631 02/28/13 2106  GLUCAP 191* 180* 259*   Fasting Lipid Panel:  Recent Labs Lab 03/01/13 0535  CHOL 190  HDL 49  LDLCALC 111*  TRIG 151*  CHOLHDL 3.9   Coagulation:  Recent Labs Lab 02/28/13 0745  LABPROT 12.4  INR 0.93   Urinalysis:  Recent Labs Lab 02/28/13 1130  COLORURINE YELLOW  LABSPEC 1.015  PHURINE 5.5  GLUCOSEU NEGATIVE  HGBUR MODERATE*  BILIRUBINUR SMALL*  KETONESUR 15*  PROTEINUR 100*  UROBILINOGEN 1.0  NITRITE NEGATIVE  LEUKOCYTESUR NEGATIVE   Misc. Labs: Repeat UA TSH  Micro Results: Recent Results (from the past  240 hour(s))  CULTURE, EXPECTORATED SPUTUM-ASSESSMENT     Status: None   Collection Time    02/28/13  4:00 PM      Result Value Range Status   Specimen Description SPUTUM   Final   Special Requests Normal   Final   Sputum evaluation     Final   Value: THIS SPECIMEN IS ACCEPTABLE. RESPIRATORY CULTURE REPORT TO FOLLOW.   Report Status 02/28/2013 FINAL   Final  CULTURE, RESPIRATORY (NON-EXPECTORATED)     Status: None   Collection Time    02/28/13  4:00 PM      Result Value Range Status   Specimen Description SPUTUM   Final   Special Requests NONE   Final   Gram Stain     Final   Value: RARE WBC PRESENT,BOTH PMN AND MONONUCLEAR     RARE SQUAMOUS EPITHELIAL CELLS PRESENT     RARE GRAM NEGATIVE RODS     RARE GRAM POSITIVE COCCI     IN PAIRS   Culture PENDING   Incomplete   Report Status PENDING   Incomplete   Studies/Results: Dg Chest 2 View  02/28/2013  *RADIOLOGY REPORT*  Clinical Data: Cough, hypoxia, COPD  CHEST - 2 VIEW  Comparison: 02/28/2013  Findings: Cardiomediastinal silhouette is stable.  No acute infiltrate or pleural effusion.  No pulmonary edema.  Stable  hyperinflation and chronic mild interstitial prominence.  Bony thorax is unremarkable.  IMPRESSION: No active disease.  Stable hyperinflation and chronic mild interstitial prominence.   Original Report Authenticated By: Natasha Mead, M.D.    Ct Angio Chest Pe W/cm &/or Wo Cm  02/28/2013  *RADIOLOGY REPORT*  Clinical Data: Shortness of breath.  CT ANGIOGRAPHY CHEST  Technique:  Multidetector CT imaging of the chest using the standard protocol during bolus administration of intravenous contrast. Multiplanar reconstructed images including MIPs were obtained and reviewed to evaluate the vascular anatomy.  Contrast: OMNIPAQUE IOHEXOL 350 MG/ML SOLN  Comparison: 03/06/2012.  Findings: Image quality is degraded by motion.  No definite pulmonary embolus.  No pathologically enlarged mediastinal lymph nodes.  There is bihilar lymphoid tissue with a right hilar lymph node measuring up to 1.5 cm, similar to prior.  No axillary adenopathy.  Pulmonary arteries are enlarged.  Heart size normal. No pericardial effusion.  Again, respiratory motion degrades image quality.  There is scattered peribronchovascular nodularity, as before, suggesting a post infectious etiology.  There is a mosaic attenuation are seen bilaterally.  No pleural fluid.  Airway is unremarkable.  Incidental imaging of the upper abdomen shows no acute findings. No worrisome lytic or sclerotic lesions.  IMPRESSION:  1.  Image quality is degraded by respiratory motion.  No definite pulmonary embolus. 2.  No acute findings to explain the patient's shortness of breath. 3.  Mosaic pulmonary parenchymal attenuation can be seen with small airways disease. 4.  Pulmonary arterial hypertension.   Original Report Authenticated By: Leanna Battles, M.D.    Dg Chest Portable 1 View  02/28/2013  *RADIOLOGY REPORT*  Clinical Data: Short of breath.  Cough.  COPD.  PORTABLE CHEST - 1 VIEW  Comparison: 04/12/2012.  Findings: Hyperinflation is present consist with  emphysema. Bilateral basilar atelectasis.  No definite airspace disease or edema.  Scarring is present near the cardiac apex. Cardiopericardial silhouette is within normal limits in appears similar to the prior exam. Monitoring leads are projected over the chest. No focal consolidation.  Exam is under penetrated at the bases.  IMPRESSION: Emphysema without acute cardiopulmonary disease.  Original Report Authenticated By: Andreas Newport, M.D.    Medications:  Scheduled Meds: . aspirin EC  81 mg Oral Daily  . doxycycline  100 mg Oral Q12H  . folic acid  1 mg Oral Daily  . guaiFENesin  600 mg Oral BID  . ipratropium  0.5 mg Nebulization TID  . levalbuterol  0.63 mg Nebulization TID  . methylPREDNISolone (SOLU-MEDROL) injection  80 mg Intravenous Q12H  . mometasone-formoterol  2 puff Inhalation BID  . oseltamivir  75 mg Oral BID  . sodium chloride  3 mL Intravenous Q12H  . thiamine  100 mg Oral Daily   Continuous Infusions:  PRN Meds:.sodium chloride, acetaminophen, acetaminophen, sodium chloride, traMADol  Assessment/Plan: 36 y.o with GOLD stage 4 COPD on 3 L O2 presented with acute on chronic COPD exacerbation likely due to influenza.   1. Acute on chronic hypoxic respiratory failure with chronic hypercarbic respiratory failure. Secondary to COPD exacerbation likely 2/2 Influenza +  -droplet precautions  -Tamiflu 75 mg bid (Day 2). Steroids (Solumedrol). Atrovent/Xopenex nebulizer -Doxycycline 100 q12 (Day 2)  -hold Advair and Spiriva. Continue Dulera, Mucinex, Tessalon capsules  -Associated with myalgias prn Ultram for pain -Currently on 3 L [place] (baseline) -qhs bipap for OHS/OSA   2. History of chronic CHF due to cor pulmonale  -echo mild LVH; EF 60%  -strict i/o; daily weights   3. Sinus tachycardia, improved  -may be 2/2 to recent increased use of Albuterol (used Albuterol 7-8 x last night), infection  -poor quality CTA to r/o PE but negative   4. Elevated troponin  -Trended  to 0.32 but normalized  -Maybe elevated in acute illness vs tachycardiac or respiratory failure. CTA negative for PE   5. Orthostatic vital signs  -orthostatics positive on admission.  will encourage oral intake  -repeat orthostatics today   6. HypoKalemia, resolved -likely 2/2 BB use   7. Myalgias  -Likely 2/2 to influenza -prn Ultra  8. Financial difficulties -Consulted social work   9. DVT px  -Lovenox    Dispo: likely discharge in 2-3 days   The patient does have a current PCP (KALIA-REYNOLDS, SHELLY, DO), therefore will be requiring OPC follow-up after discharge.   The patient does not have transportation limitations that hinder transportation to clinic appointments.  .Services Needed at time of discharge: Y = Yes, Blank = No PT:   OT:   RN:   Equipment:   Other:     LOS: 1 day   Annett Gula [phone] 03/01/2013, 8:37 AM

## 2013-03-02 DIAGNOSIS — J111 Influenza due to unidentified influenza virus with other respiratory manifestations: Secondary | ICD-10-CM

## 2013-03-02 DIAGNOSIS — J9610 Chronic respiratory failure, unspecified whether with hypoxia or hypercapnia: Secondary | ICD-10-CM

## 2013-03-02 DIAGNOSIS — I509 Heart failure, unspecified: Secondary | ICD-10-CM

## 2013-03-02 LAB — CBC WITH DIFFERENTIAL/PLATELET
Basophils Absolute: 0 10*3/uL (ref 0.0–0.1)
Basophils Relative: 0 % (ref 0–1)
Eosinophils Absolute: 0 10*3/uL (ref 0.0–0.7)
Eosinophils Relative: 0 % (ref 0–5)
MCH: 31.9 pg (ref 26.0–34.0)
MCHC: 35 g/dL (ref 30.0–36.0)
MCV: 91.2 fL (ref 78.0–100.0)
Neutrophils Relative %: 92 % — ABNORMAL HIGH (ref 43–77)
Platelets: 184 10*3/uL (ref 150–400)
RDW: 13 % (ref 11.5–15.5)

## 2013-03-02 LAB — BASIC METABOLIC PANEL
CO2: 28 mEq/L (ref 19–32)
Calcium: 9.8 mg/dL (ref 8.4–10.5)
Creatinine, Ser: 0.81 mg/dL (ref 0.50–1.10)
GFR calc non Af Amer: >90 mL/min (ref >90)
Glucose, Bld: 171 mg/dL — ABNORMAL HIGH (ref 70–99)

## 2013-03-02 LAB — HEMOGLOBIN A1C: Hgb A1c MFr Bld: 5.4 % (ref <5.7)

## 2013-03-02 LAB — HCG, SERUM, QUALITATIVE: Preg, Serum: NEGATIVE

## 2013-03-02 MED ORDER — INSULIN ASPART 100 UNIT/ML ~~LOC~~ SOLN
0.0000 [IU] | Freq: Three times a day (TID) | SUBCUTANEOUS | Status: DC
  Administered 2013-03-02: 2 [IU] via SUBCUTANEOUS

## 2013-03-02 MED ORDER — OSELTAMIVIR PHOSPHATE 75 MG PO CAPS: 75.0000 mg | ORAL_CAPSULE | Freq: Two times a day (BID) | ORAL | Status: DC

## 2013-03-02 MED ORDER — TRAMADOL HCL 50 MG PO TABS: 50.0000 mg | ORAL_TABLET | Freq: Four times a day (QID) | ORAL | Status: DC | PRN

## 2013-03-02 MED ORDER — CALCIUM CARBONATE ANTACID 500 MG PO CHEW
1.0000 | CHEWABLE_TABLET | Freq: Two times a day (BID) | ORAL | Status: DC | PRN
  Administered 2013-03-02: 200 mg via ORAL
  Filled 2013-03-02: qty 1

## 2013-03-02 MED ORDER — BENZONATATE 100 MG PO CAPS: 100.0000 mg | ORAL_CAPSULE | Freq: Three times a day (TID) | ORAL | Status: DC | PRN

## 2013-03-02 MED ORDER — PREDNISONE 20 MG PO TABS: 40.0000 mg | ORAL_TABLET | Freq: Every day | ORAL | Status: DC

## 2013-03-02 MED ORDER — DOXYCYCLINE HYCLATE 100 MG PO TABS: 100.0000 mg | ORAL_TABLET | Freq: Two times a day (BID) | ORAL | Status: DC

## 2013-03-02 MED ORDER — PREDNISONE 20 MG PO TABS
40.0000 mg | ORAL_TABLET | Freq: Every day | ORAL | Status: DC
  Administered 2013-03-02: 40 mg via ORAL
  Filled 2013-03-02 (×2): qty 2

## 2013-03-02 MED ORDER — MENTHOL 3 MG MT LOZG
1.0000 | LOZENGE | OROMUCOSAL | Status: DC | PRN
  Administered 2013-03-02: 3 mg via ORAL
  Filled 2013-03-02: qty 9

## 2013-03-02 NOTE — Progress Notes (Signed)
Internal Medicine Teaching Service Attending Note Date: 03/02/2013  Patient name: Mary [name]  Medical record number: [id number]  Date of birth: 04-04-1976    This patient has been seen and discussed with the house staff. Please see their note for complete details. I concur with their findings with the following additions/corrections:  As pertains to her pulmonary issues, influenza, Mary [name] is improved today, mainly complaining of cough which is improved with cepacol.   She has also had some vaginal spotting, vital signs are stable and Hgb is stable.  In a premenopausal woman etiologies could include pregnancy, uterine lining abnormality, drug effect (she is on only prophylactic dose lovenox, could be explained by solumedrol causing menstrual irregularity), ovarian issue.  She just recently had her menstrual cycle ending a week ago Wednesday.  Further work up for this issue can be performed as an outpatient; started in the IM clinic and referred to gynecology if deemed appropriate.  Serum HCG is pending.   Mary [name] could likely be discharged home if she is doing well this afternoon.   Mary [name] 03/02/2013, 1:50 PM

## 2013-03-02 NOTE — Progress Notes (Signed)
Subjective: Pt c/o sore throat from coughing.  She c/o heartburn.  Patient c/o irregular bleeding.  She had her menstrual cycle starting 3/16 which is now over.  She never recalls irregular bleeding in between periods but has had missed periods in the past since starting her cycle age 37.  She is still weak, coughing and chest hurting due to cough.  She has not had a bowel movement this admission but does not want medication currently.    Objective: Vital signs in last 24 hours: Filed Vitals:   03/01/13 2123 03/01/13 2141 03/02/13 0631 03/02/13 0852  BP:   100/72   Pulse:  99 81   Temp:   98 F (36.7 C)   TempSrc:   Oral   Resp:  20 20   Height:      Weight:   174 lb 13.2 oz (79.3 kg)   SpO2: 99% 95% 94% 96%   Weight change: 8.4 oz (0.238 kg)  Intake/Output Summary (Last 24 hours) at 03/02/13 1254 Last data filed at 03/02/13 0826  Gross per 24 hour  Intake    240 ml  Output    200 ml  Net     40 ml   General:lying in bed, nad, alert and oriented x 3   HEENT: [place]/at  CV: RRR no rubs, murmurs or gallops Lungs: min. Wheezing b/l  Abdomen: soft, min generalized MSK ttp, nl bs  Extremities: no cyanosis or edema  Skin: ecchymoses to extremities Neuro: moving all 4 extremities; alert and oriented x 3   Lab Results: Basic Metabolic Panel:  Recent Labs Lab 02/28/13 0915  03/01/13 0535 03/02/13 0804  NA  --   < > 133* 133*  K  --   < > 4.6 4.6  CL  --   < > 95* 96  CO2  --   < > 26 28  GLUCOSE  --   < > 169* 171*  BUN  --   < > 31* 35*  CREATININE  --   < > 1.06 0.81  CALCIUM  --   < > 9.3 9.8  MG 1.9  --   --   --   < > = values in this interval not displayed. Liver Function Tests:  Recent Labs Lab 02/28/13 0745 02/28/13 1905  AST 28 29  ALT 13 14  ALKPHOS 61 53  BILITOT 0.5 0.3  PROT 8.0 7.7  ALBUMIN 3.9 3.6   CBC:  Recent Labs Lab 02/28/13 0704  03/01/13 0535 03/02/13 0804  WBC 14.2*  --  13.6* 19.9*  NEUTROABS 11.4*  --   --  18.3*  HGB 15.7*  < >  14.8 14.6  HCT 45.9  < > 44.1 41.7  MCV 93.9  --  93.6 91.2  PLT 152  --  160 184  < > = values in this interval not displayed. Cardiac Enzymes:  Recent Labs Lab 02/28/13 0745 02/28/13 1906 03/01/13 0048 03/01/13 0535  CKTOTAL 352* 470*  --   --   CKMB 3.0 5.1*  --   --   TROPONINI 0.32* <0.30 <0.30 <0.30   BNP:  Recent Labs Lab 02/28/13 0709  PROBNP 893.3*   CBG:  Recent Labs Lab 02/28/13 1058 02/28/13 1631 02/28/13 2106 03/01/13 0707 03/02/13 1119  GLUCAP 191* 180* 259* 168* 196*   Fasting Lipid Panel:  Recent Labs Lab 03/01/13 0535  CHOL 190  HDL 49  LDLCALC 111*  TRIG 151*  CHOLHDL 3.9   Coagulation:  Recent Labs Lab 02/28/13 0745  LABPROT 12.4  INR 0.93   Urinalysis:  Recent Labs Lab 02/28/13 1130 03/01/13 1048  COLORURINE YELLOW YELLOW  LABSPEC 1.015 1.035*  PHURINE 5.5 5.5  GLUCOSEU NEGATIVE 100*  HGBUR MODERATE* TRACE*  BILIRUBINUR SMALL* SMALL*  KETONESUR 15* NEGATIVE  PROTEINUR 100* 100*  UROBILINOGEN 1.0 0.2  NITRITE NEGATIVE NEGATIVE  LEUKOCYTESUR NEGATIVE NEGATIVE   Misc. Labs: Repeat UA TSH  Micro Results: Recent Results (from the past 240 hour(s))  URINE CULTURE     Status: None   Collection Time    02/28/13 11:30 AM      Result Value Range Status   Specimen Description URINE, RANDOM   Final   Special Requests NONE   Final   Culture  Setup Time 02/28/2013 13:44   Final   [place] Count NO GROWTH   Final   Culture NO GROWTH   Final   Report Status 03/01/2013 FINAL   Final  CULTURE, EXPECTORATED SPUTUM-ASSESSMENT     Status: None   Collection Time    02/28/13  4:00 PM      Result Value Range Status   Specimen Description SPUTUM   Final   Special Requests Normal   Final   Sputum evaluation     Final   Value: THIS SPECIMEN IS ACCEPTABLE. RESPIRATORY CULTURE REPORT TO FOLLOW.   Report Status 02/28/2013 FINAL   Final  CULTURE, RESPIRATORY (NON-EXPECTORATED)     Status: None   Collection Time    02/28/13  4:00 PM       Result Value Range Status   Specimen Description SPUTUM   Final   Special Requests NONE   Final   Gram Stain     Final   Value: RARE WBC PRESENT,BOTH PMN AND MONONUCLEAR     RARE SQUAMOUS EPITHELIAL CELLS PRESENT     RARE GRAM NEGATIVE RODS     RARE GRAM POSITIVE COCCI     IN PAIRS   Culture NORMAL OROPHARYNGEAL FLORA   Final   Report Status PENDING   Incomplete   Studies/Results: No results found. Medications:  Scheduled Meds: . aspirin EC  81 mg Oral Daily  . doxycycline  100 mg Oral Q12H  . enoxaparin (LOVENOX) injection  40 mg Subcutaneous Q24H  . folic acid  1 mg Oral Daily  . guaiFENesin  600 mg Oral BID  . insulin aspart  0-9 Units Subcutaneous TID WC  . ipratropium  0.5 mg Nebulization TID  . levalbuterol  0.63 mg Nebulization TID  . mometasone-formoterol  2 puff Inhalation BID  . oseltamivir  75 mg Oral BID  . predniSONE  40 mg Oral Q breakfast  . sodium chloride  3 mL Intravenous Q12H  . thiamine  100 mg Oral Daily   Continuous Infusions:  PRN Meds:.sodium chloride, acetaminophen, acetaminophen, benzonatate, calcium carbonate, menthol-cetylpyridinium, sodium chloride, traMADol  Assessment/Plan: 37 y.o with GOLD stage 4 COPD on 3 L O2 presented with acute on chronic COPD exacerbation likely due to influenza.   1. Acute on chronic hypoxic respiratory failure with chronic hypercarbic respiratory failure. Secondary to COPD exacerbation likely 2/2 Influenza +  -droplet precautions  -Tamiflu 75 mg bid (Day 3). Steroids (Solumedrol) will changed to Prednisone 40. Atrovent/Xopenex nebulizer -Doxycycline 100 q12 (Day 3)  -hold Advair and Spiriva. Continue Dulera, Mucinex, Tessalon capsules  -Associated with myalgias prn Ultram for pain -Currently on 3 L [place] (baseline) -qhs bipap for OHS/OSA   2. History of chronic CHF due to cor  pulmonale  -echo mild LVH; EF 60%  -strict i/o; daily weights   3. Sinus tachycardia, resolved     4. Myalgias  -Likely 2/2 to  influenza -prn Ultra  5. Heartburn -Rx tums bid prn  6. Irregular vaginal bleeding -tsh normal, Urine hcg negative. Pending serum hcg.   -Etiology may be pre-menopause which may need further w/u with [facility], [facility].  -May need outpatient referral to OB/GYN and further w/u with transvaginal US outpatient.    7. Financial difficulties -Consulted social work for resources   8. DVT px  -Lovenox    Dispo: possibly d/c today or tomorrow with [facility] f/u 03/15/13 at 2:45 PM   The patient does have a current PCP (KALIA-REYNOLDS, SHELLY, DO), therefore will be requiring OPC follow-up after discharge.   The patient does not have transportation limitations that hinder transportation to clinic appointments.  .Services Needed at time of discharge: Y = Yes, Blank = No PT:   OT:   RN:   Equipment:   Other:     LOS: 2 days   Annett Gula [phone] 03/02/2013, 12:54 PM

## 2013-03-02 NOTE — Plan of Care (Signed)
Problem: Phase II Progression Outcomes Goal: ADLs completed with minimal assistance Outcome: Completed/Met Date Met:  03/02/13 Pt performs ADLs ad lib

## 2013-03-02 NOTE — Discharge Summary (Signed)
Internal Medicine Teaching [facility] Discharge Note  Name: Mary [name] MRN: [id number] DOB: Jul 06, 1976 37 y.o.  Date of Admission: 02/28/2013  6:29 AM Date of Discharge: 03/02/2013 Attending Physician: Mary Catalina, MD  Discharge Diagnosis: 1.  Acute-on-chronic respiratory failure with COPD exacerbation due to influenza  -Associated with myalgias  2. OSA on BiPAP qhs at home 3. Chronic right-sided CHF (congestive heart failure) with cor pulmonale and pulmonary hypertension 4. Sinus tachycardia  5. Elevated troponin 6. Orthostatic vital signs  7.  Hypokalemia 8. Irregular vaginal bleeding  9. Financial difficulties  Discharge Medications:   Medication List    TAKE these medications       albuterol 108 (90 BASE) MCG/ACT inhaler  Commonly known as:  PROVENTIL HFA;VENTOLIN HFA  Inhale 2 puffs into the lungs every 6 (six) hours as needed. For shortness of breath     benzonatate 100 MG capsule  Commonly known as:  TESSALON  Take 1 capsule (100 mg total) by mouth 3 (three) times daily as needed for cough.     doxycycline 100 MG tablet  Commonly known as:  VIBRA-TABS  Take 1 tablet (100 mg total) by mouth every 12 (twelve) hours.     Fluticasone-Salmeterol 250-50 MCG/DOSE Aepb  Commonly known as:  ADVAIR  Inhale 1 puff into the lungs every 12 (twelve) hours.     oseltamivir 75 MG capsule  Commonly known as:  TAMIFLU  Take 1 capsule (75 mg total) by mouth 2 (two) times daily.     predniSONE 20 MG tablet  Commonly known as:  DELTASONE  Take 2 tablets (40 mg total) by mouth daily with breakfast.     tiotropium 18 MCG inhalation capsule  Commonly known as:  SPIRIVA  Place 1 capsule (18 mcg total) into inhaler and inhale daily.     traMADol 50 MG tablet  Commonly known as:  ULTRAM  Take 1 tablet (50 mg total) by mouth every 6 (six) hours as needed.        Disposition and follow-up:   Ms.Mary [name] was discharged from [facility] in  stable condition.  At the hospital follow up visit please address 1. Repeat BMET at follow up 2. Work up irregular menstrual bleeding outpatient.  May need pelvic exam, referral to OB/GYN, possibly transvaginal US, consider FSH, LH studies  3. Social work referral and [facility] referral  4. Address medication compliance   Follow-up Appointments:  Discharge Orders   Future Appointments Provider Department Dept Phone   03/06/2013 10:30 AM [facility] CARDIAC [facility] [phone]   03/08/2013 10:30 AM [facility] Mary [name] [facility] CARDI[facility] [phone]   03/13/2013 10:30 AM [facility] Mary [name] [facility] CARDIAC [facility] [phone]   03/15/2013 2:45 PM Mary Mann, DO [facility] [phone]   Future Orders Complete By Expires     Diet - low sodium heart healthy  As directed     Discharge instructions  As directed     Comments:      Please follow up with Internal Medicine Clinic [phone] 03/15/13 at 2:45 PM Pick up your medications from the pharmacy    Increase activity slowly  As directed        Consultations:  none  Procedures Performed:  Dg Chest 2 View  02/28/2013  *RADIOLOGY REPORT*  Clinical Data: Cough, hypoxia, COPD  CHEST - 2 VIEW  Comparison: 02/28/2013  Findings: Cardiomediastinal silhouette is stable.  No acute infiltrate or pleural effusion.  No pulmonary edema.  Stable hyperinflation and chronic mild interstitial prominence.  Bony thorax is unremarkable.  IMPRESSION: No active disease.  Stable hyperinflation and chronic mild interstitial prominence.   Original Report Authenticated By: Mary [name], M.D.    Ct Angio Chest Pe W/cm &/or Wo Cm  02/28/2013  *RADIOLOGY REPORT*  Clinical Data: Shortness of breath.  CT ANGIOGRAPHY CHEST  Technique:  Multidetector CT imaging of the chest using the standard protocol during bolus administration of intravenous  contrast. Multiplanar reconstructed images including MIPs were obtained and reviewed to evaluate the vascular anatomy.  Contrast: OMNIPAQUE IOHEXOL 350 MG/ML SOLN  Comparison: 03/06/2012.  Findings: Image quality is degraded by motion.  No definite pulmonary embolus.  No pathologically enlarged mediastinal lymph nodes.  There is bihilar lymphoid tissue with a right hilar lymph node measuring up to 1.5 cm, similar to prior.  No axillary adenopathy.  Pulmonary arteries are enlarged.  Heart size normal. No pericardial effusion.  Again, respiratory motion degrades image quality.  There is scattered peribronchovascular nodularity, as before, suggesting a post infectious etiology.  There is a mosaic attenuation are seen bilaterally.  No pleural fluid.  Airway is unremarkable.  Incidental imaging of the upper abdomen shows no acute findings. No worrisome lytic or sclerotic lesions.  IMPRESSION:  1.  Image quality is degraded by respiratory motion.  No definite pulmonary embolus. 2.  No acute findings to explain the patient's shortness of breath. 3.  Mosaic pulmonary parenchymal attenuation can be seen with small airways disease. 4.  Pulmonary arterial hypertension.   Original Report Authenticated By: Mary [name], M.D.    Dg Chest Portable 1 View  02/28/2013  *RADIOLOGY REPORT*  Clinical Data: Short of breath.  Cough.  COPD.  PORTABLE CHEST - 1 VIEW  Comparison: 04/12/2012.  Findings: Hyperinflation is present consist with emphysema. Bilateral basilar atelectasis.  No definite airspace disease or edema.  Scarring is present near the cardiac apex. Cardiopericardial silhouette is within normal limits in appears similar to the prior exam. Monitoring leads are projected over the chest. No focal consolidation.  Exam is under penetrated at the bases.  IMPRESSION: Emphysema without acute cardiopulmonary disease.   Original Report Authenticated By: Mary [name], M.D.     2D Echo:  02/28/13  Study Conclusions  -  Left ventricle: The study is technically very limited. The cavity size was normal. Wall thickness was increased in a pattern of mild LVH. The estimated ejection fraction was 60%. - Right ventricle: The cavity size was mildly dilated. Systolic function was normal. There is no TR, so RV pressure can not be estimated.   Admission HPI:  Chief Complaint: shortness of breath and hypoxia  History of Present Illness:  Patient is a 37 y.o. female with a PMHx of GOLD 4 COPD on home oxygen (3L via [place]), OSA/OHS, chronic right-sided CHF/ cor pulmonale, who presents to [facility] for evaluation of acute shortness of breath that started on the morning of admission. Patient states that she wears BiPAP at night, this morning she awoke with acute SOB, gasping for breath, with oxygen sats 60s on BiPAP with heart rates of 140-150 bpm before any BD therapy. She indicates temporary relief (15 minutes) of symptoms with albuterol usage of home, after which symptoms return. However, she has been out of her Advair and Spiriva for the past several days. States she had a similar experience yesterday, with O2 sats to 85, she subsequently called 911. EMS provided albuterol  nebulizer therapy, with improvement of her O2 to the low 90s.  Also admits to a preceding 1-2 day history of productive cough with scant green sputum production, diffuse muscle pain in lower extremities, headache. Confirms subjective fevers at home with temp of 100.4 F on arrival of EMS yesterday. She further confirms worsening DOE, PND, and stable 2 pillow orthopnea.Confirms DOE, confirms chronic 2 pillow orthopnea, (+) PND.  In regards to her remote PE in 03/2012, patient indicates she was continued on coumadin therapy until end of June 2013 (corroborated with [facility] records). At that time, it was determined that likely she had adequate treatment for her unprovoked 1st VTE, and decision was also made to discontinue because pt was having difficulty to afford her coumadin,  and make regular coumadin clinic appts. Her financial difficulties have also precluded pt from following regularly with her pulmonologist, Dr. Marchelle Gearing, cardiologist, Dr. Gala Romney (unclear if she has ever seen him although referred multiple times), or PCP (me).  Review of Systems:  Constitutional:  admits to fever, chills, diaphoresis, decreased appetite change and fatigue.   HEENT:  admits to rhinorrhea, eye pain  Denies eye discharge or redness, hearing loss, ear pain, sore throat.   Respiratory:  admits to SOB, DOE, cough, chest tightness, and wheezing.   Cardiovascular:  admits to palpitations.  Denies chest pain and leg swelling.   Gastrointestinal:  denies nausea, vomiting, abdominal pain, diarrhea, constipation, blood in stool.   Genitourinary:  denies dysuria, urgency, frequency, hematuria, flank pain and difficulty urinating.   Musculoskeletal:  admits to myalgias.  Denies back pain, joint swelling, arthralgias and gait problem.   Skin:  denies pallor, rash and wound.   Neurological:  admits to positional lightheadedness.  Denies dizziness, seizures, syncope, weakness, numbness and headaches.   Hematological:  admits to easy bruising.  Denies personal or family bleeding history.   Psychiatric:  denies suicidal ideation, mood changes, sleep disturbance and agitation.    Vital Signs:  Blood pressure 107/75, pulse 116, temperature 99.7 F (37.6 C), temperature source Oral, resp. rate 29, last menstrual period 02/14/2013, SpO2 89.00%.  Physical Exam:  General:  Vital signs reviewed and noted. Well-developed, well-nourished, in no acute distress; alert, appropriate and cooperative throughout examination. Able to speak in full sentences without increased work of breathing, however she does further desaturate to the mid 80s with prolonged speaking.   Head:  Normocephalic, atraumatic.   Ears:  TM without bulging, nonerythematous bilaterally. External auditory canals without significant  edema or erythema   Eyes:  PERRL, EOMI, No signs of anemia or jaundince. left lateral strabismus.   Nose:  Mucous membranes moist, inflammed, with yellow/green discharge, nonerythematous.   Throat:  Oropharynx nonerythematous, no exudate appreciated. Poor dentition throughout, left posterior molar with significant caries.   Neck:  No deformities, masses, or tenderness noted. Supple, No carotid Bruits, no JVD.   Lungs:  Normal respiratory effort. Decreased air movement throughout all lung fields. Diffuse mild expiratory and inspiratory wheezing.   Heart:  Tachycardic, but regular rhthym. S1 and S2 normal without gallop, murmur, or rubs.   Abdomen:  BS normoactive. Soft, Nondistended, non-tender. No masses or organomegaly.   Extremities:  No pretibial edema.   Neurologic:  A&O X3, CN II - XII are grossly intact. Motor strength is 5/5 in the all 4 extremities, Sensations intact to light touch, Cerebellar signs negative.   Skin:  No visible rashes, scars.       Hospital Course by problem list: 1.  Acute-on-chronic respiratory  failure with COPD exacerbation due to influenza  -Associated with myalgias  2. OSA on BiPAP qhs at home 3. Chronic right-sided CHF (congestive heart failure) with cor pulmonale and pulmonary hypertension 4. Sinus tachycardia  5. Elevated troponin 6. Orthostatic vital signs  7.  Hypokalemia 8. Irregular vaginal bleeding  9. Financial difficulties  37 y.o with GOLD stage 4 COPD on 3 L O2 presented with acute on chronic COPD exacerbation likely due to influenza.   1. Acute on chronic hypoxic respiratory failure with chronic hypercarbic respiratory failure. Secondary to COPD exacerbation likely secondary to influenza.  Baseline 3 L oxygen daily.   She had increased cough, sputum production and dyspnea.  She was initially tachypneic in the 20s, tachycardic in the 140s, saturating in the upper 80s on 6 L.  We placed the patient on droplet precautions.  She was started on  Tamiflu 75 mg bid to complete for 5 days.  She was initially given Solumedrol which was transitioned to Prednisone to complete a total of 5 days of steroids  She was given Atrovent/Xopenex nebulizers this admission.  She was started on Doxycycline 100 q12 to be completed for 5 days.  We held her Advair and Spiriva this admission and she was given Dulera, Mucinex, Tessalon capsules.  She had associated myalgias and was given Ultram as needed for pain.  She was given one dose of Toradol 30 mg iv for pain.  By discharge her shortness of breath improved and she was on her baseline 3 L oxygen.  Of note she missed some her medications for COPD for a couple of days prior to admission for financial reasons (i.e affordability).   2. History of OHS/OSA She is on bipap nightly at home which was used this admission.   3. History of chronic CHF due to cor pulmonale associated with pulmonary hypertension Repeat echo this admission with mild LVH; EF 60%.   4. Sinus tachycardia Thought to be secondary to recent increased use of Albuterol, infection, dehydration.  She had a CTA chest this admission to rule out pulmonary embolism due to history of pulmonary embolism now off anticoagulation.  CTA chest was poor quality but negative. Sinus tachycardia improved by discharge.    5. Elevated troponin  Troponin trended to 0.32 but normalized by discharge.  May have been elevated in acute illness versus tachycardiac or respiratory failure. CTA negative for PE.   6. Orthostatic vital signs  Orthostatics positive on admission likely secondary to dehydration and decreased oral intake prior to admission.  7. Hypokalemia Initially thought to be secondary to beta blocker usage prior to admission.  Potassium normalized by discharge.   8. Irregular vaginal bleeding  On day of discharge patient noted irregular vaginal bleeding which she has never experienced.  She has experienced a menstrual cycle since age 60 but never  irregular bleeding.  She has had missed menstrual cycles since age 89 though.  She also states when previously taking Coumadin and Lovenox she never experienced irregular vaginal bleeding.  Her tsh was normal.  Serum and urine hcg negative. Etiology unclear at this time may be pre-menopause which may need further workup with FSH, LH.  May need outpatient referral to OB/GYN and further workup with transvaginal US outpatient.   9. Financial difficulties  Patient is living on husbands income and having trouble affording her medications.  She will need a social work and possibly [facility] referral outpatient     She was given therapeutic Lovenox on admission due to possibility  of pulmonary embolus then given prophylactic Lovenox for DVT prophylaxis.     Discharge Vitals:  BP 100/72  Pulse 81  Temp(Src) 98 F (36.7 C) (Oral)  Resp 20  Ht 5\' 2"  (1.575 m)  Wt 174 lb 13.2 oz (79.3 kg)  BMI 31.97 kg/m2  SpO2 96%  LMP 02/14/2013  Discharge physical exam:  General:lying in bed, nad, alert and oriented x 3  HEENT: [place]/at  CV: RRR no rubs, murmurs or gallops  Lungs: min. Wheezing b/l  Abdomen: soft, min generalized MSK ttp, nl bs  Extremities: no cyanosis or edema  Skin: ecchymoses to extremities  Neuro: moving all 4 extremities; alert and oriented x 3   Discharge Labs:  Results for TISHINA, LOWN (MRN [id number]) as of 03/04/2013 12:39  Ref. Range 02/28/2013 07:21 02/28/2013 07:36  Sample type No range found  ARTERIAL  pH, Arterial Latest Range: 7.350-7.450   7.320 (L)  pCO2 arterial Latest Range: 35.0-45.0 mmHg  49.1 (H)  pO2, Arterial Latest Range: 80.0-100.0 mmHg  75.0 (L)  Bicarbonate Latest Range: 20.0-24.0 mEq/L  25.3 (H)  TCO2 Latest Range: 0-100 mmol/L 29 27  Acid-base deficit Latest Range: 0.0-2.0 mmol/L  2.0  O2 Saturation No range found  93.0  Collection site No range found  RADIAL, ALLEN'S TEST ACCEPTABLE   Results for GAVRIELA, CASHIN (MRN [id number]) as of 03/04/2013 12:39  Ref.  Range 02/28/2013 07:45 02/28/2013 09:15 02/28/2013 19:05 03/01/2013 05:35 03/02/2013 08:04  Sodium Latest Range: 135-145 mEq/L 135  133 (L) 133 (L) 133 (L)  Potassium Latest Range: 3.5-5.1 mEq/L 3.3 (L)  4.3 4.6 4.6  Chloride Latest Range: 96-112 mEq/L 93 (L)  95 (L) 95 (L) 96  CO2 Latest Range: 19-32 mEq/L 27  27 26 28   Mean Plasma Glucose Latest Range: <117 mg/dL     865  BUN Latest Range: 6-23 mg/dL 13  23 31  (H) 35 (H)  Creatinine Latest Range: 0.50-1.10 mg/dL 7.84  6.96 2.95 2.84  Calcium Latest Range: 8.4-10.5 mg/dL 9.0  9.3 9.3 9.8  GFR calc non Af Amer Latest Range: >90 mL/min >90  66 (L) 67 (L) >90  GFR calc Af Amer Latest Range: >90 mL/min >90  77 (L) 78 (L) >90  Glucose Latest Range: 70-99 mg/dL 132 (H)  440 (H) 102 (H) 171 (H)  Magnesium Latest Range: 1.5-2.5 mg/dL  1.9     Alkaline Phosphatase Latest Range: 39-117 U/L 61  53    Albumin Latest Range: 3.5-5.2 g/dL 3.9  3.6    AST Latest Range: 0-37 U/L 28  29    ALT Latest Range: 0-35 U/L 13  14    Total Protein Latest Range: 6.0-8.3 g/dL 8.0  7.7    Total Bilirubin Latest Range: 0.3-1.2 mg/dL 0.5  0.3     Results for JOYCELIN, RADLOFF (MRN [id number]) as of 03/04/2013 12:39  Ref. Range 02/28/2013 07:21 02/28/2013 07:45  Sodium Latest Range: 135-145 mEq/L 134 (L) 135  Potassium Latest Range: 3.5-5.1 mEq/L 3.4 (L) 3.3 (L)  Chloride Latest Range: 96-112 mEq/L 97 93 (L)  CO2 Latest Range: 19-32 mEq/L  27  BUN Latest Range: 6-23 mg/dL 13 13  Creatinine Latest Range: 0.50-1.10 mg/dL 3.47 4.25  Calcium Latest Range: 8.4-10.5 mg/dL  9.0  GFR calc non Af Amer Latest Range: >90 mL/min  >90  GFR calc Af Amer Latest Range: >90 mL/min  >90  Glucose Latest Range: 70-99 mg/dL 956 (H) 387 (H)  Calcium Ionized Latest Range: 1.12-1.23 mmol/L 1.13   Alkaline Phosphatase Latest  Range: 39-117 U/L  61  Albumin Latest Range: 3.5-5.2 g/dL  3.9  AST Latest Range: 0-37 U/L  28  ALT Latest Range: 0-35 U/L  13  Total Protein Latest Range: 6.0-8.3 g/dL   8.0  Total Bilirubin Latest Range: 0.3-1.2 mg/dL  0.5   Results for SHERNITA, RABINOVICH (MRN [id number]) as of 03/04/2013 12:39  Ref. Range 02/28/2013 07:09 02/28/2013 07:19 02/28/2013 07:45 02/28/2013 19:06 03/01/2013 00:48 03/01/2013 05:35  CK, MB Latest Range: 0.3-4.0 ng/mL   3.0 5.1 (H)    CK Total Latest Range: 7-177 U/L   352 (H) 470 (H)    Troponin I Latest Range: <0.30 ng/mL   0.32 (HH) <0.30 <0.30 <0.30  Troponin i, poc Latest Range: 0.00-0.08 ng/mL  0.09 (HH)      Pro B Natriuretic peptide (BNP) Latest Range: 0-125 pg/mL 893.3 (H)        Results for ANNELISA, RYBACK (MRN [id number]) as of 03/04/2013 12:39  Ref. Range 03/01/2013 05:35  Cholesterol Latest Range: 0-200 mg/dL 782  Triglycerides Latest Range: <150 mg/dL 956 (H)  HDL Latest Range: >39 mg/dL 49  LDL (calc) Latest Range: 0-99 mg/dL 213 (H)  VLDL Latest Range: 0-40 mg/dL 30  Total CHOL/HDL Ratio No range found 3.9   Results for ANGELLI, BARUCH (MRN [id number]) as of 03/04/2013 12:39  Ref. Range 02/28/2013 07:04 02/28/2013 07:21 03/01/2013 05:35 03/02/2013 08:04  WBC Latest Range: 4.0-10.5 K/uL 14.2 (H)  13.6 (H) 19.9 (H)  RBC Latest Range: 3.87-5.11 MIL/uL 4.89  4.71 4.57  Hemoglobin Latest Range: 12.0-15.0 g/dL 62.9 (H) 52.8 (H) 41.3 14.6  HCT Latest Range: 36.0-46.0 % 45.9 50.0 (H) 44.1 41.7  MCV Latest Range: 78.0-100.0 fL 93.9  93.6 91.2  MCH Latest Range: 26.0-34.0 pg 32.1  31.4 31.9  MCHC Latest Range: 30.0-36.0 g/dL 24.4  01.0 27.2  RDW Latest Range: 11.5-15.5 % 13.1  12.9 13.0  Platelets Latest Range: 150-400 K/uL 152  160 184  Neutrophils Relative Latest Range: 43-77 % 80 (H)   92 (H)  Lymphocytes Relative Latest Range: 12-46 % 12   4 (L)  Monocytes Relative Latest Range: 3-12 % 8   4  Eosinophils Relative Latest Range: 0-5 % 0   0  Basophils Relative Latest Range: 0-1 % 0   0  NEUT# Latest Range: 1.7-7.7 K/uL 11.4 (H)   18.3 (H)  Lymphocytes Absolute Latest Range: 0.7-4.0 K/uL 1.7   0.7  Monocytes Absolute Latest Range:  0.1-1.0 K/uL 1.1 (H)   0.8  Eosinophils Absolute Latest Range: 0.0-0.7 K/uL 0.0   0.0  Basophils Absolute Latest Range: 0.0-0.1 K/uL 0.0   0.0   Results for NAIA, RUFF (MRN [id number]) as of 03/04/2013 12:39  Ref. Range 02/28/2013 07:45  Prothrombin Time Latest Range: 11.6-15.2 seconds 12.4  INR Latest Range: 0.00-1.49  0.93   Results for JOSILYNN, LOSH (MRN [id number]) as of 03/04/2013 12:39  Ref. Range 02/28/2013 07:21 02/28/2013 07:45 02/28/2013 11:30 02/28/2013 19:05 03/01/2013 05:35 03/02/2013 08:04 03/02/2013 13:47  Hemoglobin A1C Latest Range: <5.7 %      5.4   Glucose Latest Range: 70-99 mg/dL 756 (H) 433 (H)  295 (H) 169 (H) 171 (H)   Preg, Serum Latest Range: NEGATIVE        NEGATIVE  Preg Test, Ur Latest Range: NEGATIVE    NEGATIVE      TSH Latest Range: 0.350-4.500 uIU/mL     1.381     Results for AVALEY, COOP (MRN [id number]) as of 03/04/2013 12:39  Ref. Range 02/28/2013 11:46  Influenza A By PCR Latest Range: NEGATIVE  POSITIVE (A)  Influenza B By PCR Latest Range: NEGATIVE  NEGATIVE  H1N1 flu by pcr Latest Range: NOT DETECTED  DETECTED (A)   Results for EBBIE, SORENSON (MRN [id number]) as of 03/04/2013 12:39  Ref. Range 02/28/2013 11:30 03/01/2013 10:48  Color, Urine Latest Range: YELLOW  YELLOW YELLOW  APPearance Latest Range: CLEAR  CLOUDY (A) CLOUDY (A)  Specific Gravity, Urine Latest Range: 1.005-1.030  1.015 1.035 (H)  pH Latest Range: 5.0-8.0  5.5 5.5  Glucose Latest Range: NEGATIVE mg/dL NEGATIVE 213 (A)  Bilirubin Urine Latest Range: NEGATIVE  SMALL (A) SMALL (A)  Ketones, ur Latest Range: NEGATIVE mg/dL 15 (A) NEGATIVE  Protein Latest Range: NEGATIVE mg/dL 086 (A) 578 (A)  Urobilinogen, UA Latest Range: 0.0-1.0 mg/dL 1.0 0.2  Nitrite Latest Range: NEGATIVE  NEGATIVE NEGATIVE  Leukocytes, UA Latest Range: NEGATIVE  NEGATIVE NEGATIVE  Hgb urine dipstick Latest Range: NEGATIVE  MODERATE (A) TRACE (A)  WBC, UA Latest Range: <3 WBC/hpf 3-6 3-6  RBC / HPF Latest  Range: <3 RBC/hpf  0-2  Squamous Epithelial / LPF Latest Range: RARE  MANY (A) FEW (A)  Bacteria, UA Latest Range: RARE  FEW (A) RARE  Casts Latest Range: NEGATIVE  GRANULAR CAST (A) HYALINE CASTS (A)  Preg Test, Ur Latest Range: NEGATIVE  NEGATIVE    3/26 Respiratory culture normal oral flora  Results for orders placed during the hospital encounter of 02/28/13 (from the past 24 hour(s))  BASIC METABOLIC PANEL     Status: Abnormal   Collection Time    03/02/13  8:04 AM      Result Value Range   Sodium 133 (*) 135 - 145 mEq/L   Potassium 4.6  3.5 - 5.1 mEq/L   Chloride 96  96 - 112 mEq/L   CO2 28  19 - 32 mEq/L   Glucose, Bld 171 (*) 70 - 99 mg/dL   BUN 35 (*) 6 - 23 mg/dL   Creatinine, Ser 4.69  0.50 - 1.10 mg/dL   Calcium 9.8  8.4 - 62.9 mg/dL   GFR calc non Af Amer >90  >90 mL/min   GFR calc Af Amer >90  >90 mL/min  CBC WITH DIFFERENTIAL     Status: Abnormal   Collection Time    03/02/13  8:04 AM      Result Value Range   WBC 19.9 (*) 4.0 - 10.5 K/uL   RBC 4.57  3.87 - 5.11 MIL/uL   Hemoglobin 14.6  12.0 - 15.0 g/dL   HCT 52.8  41.3 - 24.4 %   MCV 91.2  78.0 - 100.0 fL   MCH 31.9  26.0 - 34.0 pg   MCHC 35.0  30.0 - 36.0 g/dL   RDW 01.0  27.2 - 53.6 %   Platelets 184  150 - 400 K/uL   Neutrophils Relative 92 (*) 43 - 77 %   Neutro Abs 18.3 (*) 1.7 - 7.7 K/uL   Lymphocytes Relative 4 (*) 12 - 46 %   Lymphs Abs 0.7  0.7 - 4.0 K/uL   Monocytes Relative 4  3 - 12 %   Monocytes Absolute 0.8  0.1 - 1.0 K/uL   Eosinophils Relative 0  0 - 5 %   Eosinophils Absolute 0.0  0.0 - 0.7 K/uL   Basophils Relative 0  0 - 1 %   Basophils Absolute 0.0  0.0 - 0.1 K/uL  GLUCOSE, CAPILLARY     Status: Abnormal  Collection Time    03/02/13 11:19 AM      Result Value Range   Glucose-Capillary 196 (*) 70 - 99 mg/dL   Comment 1 Notify RN      Signed: Annett Gula 03/02/2013, 1:07 PM   Time Spent on Discharge: >30 minutes  Services Ordered on Discharge: none Equipment Ordered on  Discharge: none

## 2013-03-02 NOTE — Progress Notes (Signed)
Patient notified nurse that she has begun her menses cycle for the second time and states that this is very unusual for her and is very concerned about this. Patient states that she just had her menses cycle a week ago and has never had anything occur like this before. Patient is very worried and therefore will leave sticky note for MD to see and notify oncoming nurse to notify MD to address when making rounds. Will continue to monitor to end of shift.

## 2013-03-02 NOTE — Progress Notes (Signed)
Pt being dc to home, pt given d/c instructions, prescriptions, and follow up appointments, pt verbalized understanding, pt left via wheelchair with family, pt stable upon d/c

## 2013-03-03 LAB — CULTURE, RESPIRATORY W GRAM STAIN: Culture: NORMAL

## 2013-03-05 NOTE — Discharge Summary (Signed)
I saw Mary [name] on day of discharge and assisted the resident team in formulating the plan as noted.

## 2013-03-06 ENCOUNTER — Encounter (HOSPITAL_COMMUNITY): Payer: 59

## 2013-03-08 ENCOUNTER — Encounter (HOSPITAL_COMMUNITY): Payer: 59

## 2013-03-13 ENCOUNTER — Encounter (HOSPITAL_COMMUNITY): Payer: 59

## 2013-03-14 NOTE — Progress Notes (Signed)
RN, Cathie Olden called patient to find out why patient was missing so much and patient stated "she was in the hospital and she never really had gas money to come". Pt felt it was best to drop right now and then come back with a new referral. Pt completed 5 sessions and never turned in any pre paperwork to determine how much patient has improved or changed. Completed a 6 minute walk test and her oxygen saturations maintained above 97% on 3L NCC. Pt ambulated 970 ft and had no complaints during the walk test. MET level was 2.45. Pt understands to discuss pulmonary rehab with her MD in the future to come back.

## 2013-03-15 ENCOUNTER — Encounter: Payer: 59 | Admitting: Internal Medicine

## 2013-03-20 ENCOUNTER — Encounter: Payer: 59 | Admitting: Internal Medicine

## 2013-03-28 ENCOUNTER — Encounter: Payer: 59 | Admitting: Internal Medicine

## 2013-03-29 ENCOUNTER — Other Ambulatory Visit: Payer: Self-pay | Admitting: Internal Medicine

## 2013-04-26 ENCOUNTER — Telehealth: Payer: Self-pay | Admitting: Internal Medicine

## 2013-04-26 MED ORDER — ALBUTEROL SULFATE HFA 108 (90 BASE) MCG/ACT IN AERS: 2.0000 | INHALATION_SPRAY | Freq: Four times a day (QID) | RESPIRATORY_TRACT | Status: DC | PRN

## 2013-04-26 MED ORDER — FLUTICASONE-SALMETEROL 250-50 MCG/DOSE IN AEPB: 1.0000 | INHALATION_SPRAY | Freq: Two times a day (BID) | RESPIRATORY_TRACT | Status: DC

## 2013-04-26 NOTE — Telephone Encounter (Signed)
Rx has been sent in. Pt is aware. 

## 2013-05-02 ENCOUNTER — Encounter (HOSPITAL_COMMUNITY): Payer: Self-pay | Admitting: Emergency Medicine

## 2013-05-02 ENCOUNTER — Emergency Department (INDEPENDENT_AMBULATORY_CARE_PROVIDER_SITE_OTHER)
Admission: EM | Admit: 2013-05-02 | Discharge: 2013-05-02 | Disposition: A | Discharge status: Nursing Home (Includes ICF) | Payer: 59 | Source: Home / Self Care

## 2013-05-02 ENCOUNTER — Ambulatory Visit (HOSPITAL_COMMUNITY)
Admission: EM | Admit: 2013-05-02 | Discharge: 2013-05-04 | Disposition: A | Discharge status: Home / Self Care | Payer: 59 | Attending: General Surgery | Admitting: General Surgery

## 2013-05-02 DIAGNOSIS — J449 Chronic obstructive pulmonary disease, unspecified: Secondary | ICD-10-CM | POA: Insufficient documentation

## 2013-05-02 DIAGNOSIS — Z86711 Personal history of pulmonary embolism: Secondary | ICD-10-CM | POA: Insufficient documentation

## 2013-05-02 DIAGNOSIS — Z86718 Personal history of other venous thrombosis and embolism: Secondary | ICD-10-CM | POA: Insufficient documentation

## 2013-05-02 DIAGNOSIS — R1031 Right lower quadrant pain: Secondary | ICD-10-CM | POA: Insufficient documentation

## 2013-05-02 DIAGNOSIS — F1021 Alcohol dependence, in remission: Secondary | ICD-10-CM | POA: Insufficient documentation

## 2013-05-02 DIAGNOSIS — Z87891 Personal history of nicotine dependence: Secondary | ICD-10-CM | POA: Insufficient documentation

## 2013-05-02 DIAGNOSIS — I509 Heart failure, unspecified: Secondary | ICD-10-CM | POA: Insufficient documentation

## 2013-05-02 DIAGNOSIS — K3580 Unspecified acute appendicitis: Secondary | ICD-10-CM | POA: Insufficient documentation

## 2013-05-02 DIAGNOSIS — Z598 Other problems related to housing and economic circumstances: Secondary | ICD-10-CM | POA: Insufficient documentation

## 2013-05-02 DIAGNOSIS — J4489 Other specified chronic obstructive pulmonary disease: Secondary | ICD-10-CM | POA: Insufficient documentation

## 2013-05-02 DIAGNOSIS — K37 Unspecified appendicitis: Secondary | ICD-10-CM

## 2013-05-02 DIAGNOSIS — Z5987 Material hardship due to limited financial resources, not elsewhere classified: Secondary | ICD-10-CM | POA: Insufficient documentation

## 2013-05-02 HISTORY — DX: Shortness of breath: R06.02

## 2013-05-02 HISTORY — DX: Pneumonia, unspecified organism: J18.9

## 2013-05-02 HISTORY — DX: Dependence on supplemental oxygen: Z99.81

## 2013-05-02 HISTORY — DX: Migraine, unspecified, not intractable, without status migrainosus: G43.909

## 2013-05-02 HISTORY — DX: Personal history of other diseases of the respiratory system: Z87.09

## 2013-05-02 LAB — URINALYSIS, ROUTINE W REFLEX MICROSCOPIC
Ketones, ur: 15 mg/dL — AB
Leukocytes, UA: NEGATIVE
Nitrite: NEGATIVE
Specific Gravity, Urine: 1.027 (ref 1.005–1.030)
pH: 5.5 (ref 5.0–8.0)

## 2013-05-02 LAB — CBC WITH DIFFERENTIAL/PLATELET
Basophils Relative: 0 % (ref 0–1)
Hemoglobin: 16 g/dL — ABNORMAL HIGH (ref 12.0–15.0)
MCHC: 35.1 g/dL (ref 30.0–36.0)
Monocytes Relative: 9 % (ref 3–12)
Neutro Abs: 9.8 10*3/uL — ABNORMAL HIGH (ref 1.7–7.7)
Neutrophils Relative %: 74 % (ref 43–77)
RBC: 5.01 MIL/uL (ref 3.87–5.11)

## 2013-05-02 LAB — COMPREHENSIVE METABOLIC PANEL
AST: 19 U/L (ref 0–37)
Albumin: 4.4 g/dL (ref 3.5–5.2)
Alkaline Phosphatase: 81 U/L (ref 39–117)
BUN: 9 mg/dL (ref 6–23)
Chloride: 95 mEq/L — ABNORMAL LOW (ref 96–112)
Potassium: 3.8 mEq/L (ref 3.5–5.1)
Total Bilirubin: 0.7 mg/dL (ref 0.3–1.2)

## 2013-05-02 LAB — POCT PREGNANCY, URINE: Preg Test, Ur: NEGATIVE

## 2013-05-02 NOTE — ED Notes (Signed)
PT. REPORTS RLQ PAIN WITH EMESIS ( X1) ONSET THIS MORNING , SEEN AT [facility] SENT HERE TO R/O APPENDICITIS. , DENIES FEVER OR CHILLS.

## 2013-05-02 NOTE — ED Provider Notes (Signed)
History     CSN: [id number]  Arrival date & time 05/02/13  1853   None     Chief Complaint  Patient presents with  . Abdominal Pain    (Consider location/radiation/quality/duration/timing/severity/associated sxs/prior treatment) HPI Comments: Pt presents c/o severe RLQ abdominal pain and tenderness that radiates towards the umbilicus that was present upon awakening this AM.  She vomited once this morning as well, she assumes from the pain.  She has also felt feverish all day but has not checked a temp.  She has had one soft BM today.  She has not tried to eat anything today but she did have a sip of soda while in the waiting room which made her very nauseated.    Patient is a 37 y.o. female presenting with abdominal pain.  Abdominal Pain Associated symptoms include abdominal pain and shortness of breath (chronic, no increase in SOB). Pertinent negatives include no chest pain.    Past Medical History  Diagnosis Date  . COPD (chronic obstructive pulmonary disease)     PFTS 03/08/12: fev1 0.58L/1%, FVC 1.18/33%, Ratop 49 and c/w ssevere obstruction. 21% BD response on FVC, RV 219%, DLCO 11/54%  . Chronic asthma   . History of alcoholism   . Tobacco abuse     quit 02/2012  . Irregular menses   . DVT of upper extremity (deep vein thrombosis) April 2013    right subclavian // Unclear precipitating cause - possibly significant right heart failure, with vascular stasis potentially predisposing to clotting.    . Pulmonary embolus April 2013    Precipitating cause unclear. Was treated with coumadin from April-June 2013.  . Moderate to severe pulmonary hypertension April 2013    Cardiac cath on 03/06/12 - 1. Elevated pulmonary artery pressures, right sided filling pressures.,  2. PA: 64/45 (mean 53)    . OSA (obstructive sleep apnea)     wears noctural BiPAP  . Hepatic cirrhosis     Questionable history of - Noted on CT abdomen (03/2012) - thought to be due to vascular congestion from right  heart failure +/- patient's history of alcohol abuse  . Axillary adenopathy     right axillary adenopathy noted on CT chest (03/06/2012)  . Periodontal disease   . Chronic right-sided CHF (congestive heart failure) 03/23/2012    with cor pulmonale. Last RHC 03/2012  . Shortness of breath     Past Surgical History  Procedure Laterality Date  . Cardiac surgery    . Lung surgery      Family History  Problem Relation Age of Onset  . Hypertension Father   . Heart disease Father     CHF; died age 1   . Alcohol abuse Father   . Other Brother     died age 24 y.o overdose    History  Substance Use Topics  . Smoking status: Former Smoker -- 1.00 packs/day for 25 years    Types: Cigarettes    Quit date: 02/18/2012  . Smokeless tobacco: Never Used     Comment: quit smoking 1 month ago  . Alcohol Use: No     Comment: previously drinking 1 pint 3-4 days a week for 2002-2013, but in 2014, no longer drinking    OB History   Grav Para Term Preterm Abortions TAB SAB Ect Mult Living                  Review of Systems  Constitutional: Positive for fever and chills.  Eyes: Negative  for visual disturbance.  Respiratory: Positive for shortness of breath (chronic, no increase in SOB). Negative for cough.   Cardiovascular: Negative for chest pain, palpitations and leg swelling.  Gastrointestinal: Positive for nausea, vomiting and abdominal pain. Negative for diarrhea and constipation.  Endocrine: Negative for polydipsia and polyuria.  Genitourinary: Negative for dysuria, urgency and frequency.  Musculoskeletal: Negative for myalgias and arthralgias.  Skin: Negative for rash.  Neurological: Negative for dizziness, weakness and light-headedness.    Allergies  Review of patient's allergies indicates no known allergies.  Home Medications   Current Outpatient Rx  Name  Route  Sig  Dispense  Refill  . albuterol (VENTOLIN HFA) 108 (90 BASE) MCG/ACT inhaler   Inhalation   Inhale 2 puffs  into the lungs every 6 (six) hours as needed for wheezing.   18 each   0     Pt needs appt for more refills   . Fluticasone-Salmeterol (ADVAIR) 250-50 MCG/DOSE AEPB   Inhalation   Inhale 1 puff into the lungs every 12 (twelve) hours.   60 each   0   . tiotropium (SPIRIVA) 18 MCG inhalation capsule   Inhalation   Place 1 capsule (18 mcg total) into inhaler and inhale daily.   30 capsule   5   . benzonatate (TESSALON) 100 MG capsule   Oral   Take 1 capsule (100 mg total) by mouth 3 (three) times daily as needed for cough.   20 capsule   0   . doxycycline (VIBRA-TABS) 100 MG tablet   Oral   Take 1 tablet (100 mg total) by mouth every 12 (twelve) hours.   5 tablet   0   . oseltamivir (TAMIFLU) 75 MG capsule   Oral   Take 1 capsule (75 mg total) by mouth 2 (two) times daily.   6 capsule   0   . predniSONE (DELTASONE) 20 MG tablet   Oral   Take 2 tablets (40 mg total) by mouth daily with breakfast.   6 tablet   0   . traMADol (ULTRAM) 50 MG tablet   Oral   Take 1 tablet (50 mg total) by mouth every 6 (six) hours as needed.   30 tablet   0     BP 129/82  Pulse 101  Temp(Src) 99 F (37.2 C) (Oral)  Resp 20  SpO2 95%  LMP 04/27/2013  Physical Exam  Nursing note and vitals reviewed. Constitutional: She is oriented to person, place, and time. Vital signs are normal. She appears well-developed and well-nourished. No distress.  HENT:  Head: Atraumatic.  Eyes: EOM are normal. Pupils are equal, round, and reactive to light.  Cardiovascular: Normal rate, regular rhythm and normal heart sounds.  Exam reveals no gallop and no friction rub.   No murmur heard. Pulmonary/Chest: Effort normal. No respiratory distress. She has decreased breath sounds (throughout). She has no wheezes. She has no rales.  Abdominal: Soft. She exhibits no ascites. There is tenderness in the right lower quadrant. There is rebound and tenderness at McBurney's point. There is no guarding and  negative Murphy's sign.  Neurological: She is alert and oriented to person, place, and time. She has normal strength.  Skin: Skin is warm and dry. She is not diaphoretic.  Psychiatric: She has a normal mood and affect. Her behavior is normal. Judgment normal.    ED Course  Procedures (including critical care time)  Labs Reviewed - No data to display No results found.   1. Abdominal pain,  RLQ       MDM  Pt exam suspicious for acute abdominal pathology, pt being transferred to ED for further workup and imaging         Graylon Good, PA-C 05/02/13 2117

## 2013-05-02 NOTE — ED Notes (Signed)
C/op abdominal pain  onset this AM.  C/o nausea and vomited x 1.  Has not eaten today. Started as a bloating pain in her upper abdomen and then gets a sharp shooting to RLQ of abdomen.  No chills or fever. Pt. Is on home O2.

## 2013-05-03 ENCOUNTER — Encounter (HOSPITAL_COMMUNITY)
Admission: EM | Disposition: A | Discharge status: Home / Self Care | Payer: Self-pay | Source: Home / Self Care | Attending: Emergency Medicine

## 2013-05-03 ENCOUNTER — Encounter (HOSPITAL_COMMUNITY): Payer: Self-pay | Admitting: Anesthesiology

## 2013-05-03 ENCOUNTER — Emergency Department (HOSPITAL_COMMUNITY): Payer: 59

## 2013-05-03 ENCOUNTER — Emergency Department (HOSPITAL_COMMUNITY): Payer: 59 | Admitting: Anesthesiology

## 2013-05-03 DIAGNOSIS — K3580 Unspecified acute appendicitis: Secondary | ICD-10-CM

## 2013-05-03 HISTORY — PX: APPENDECTOMY: SHX54

## 2013-05-03 HISTORY — PX: LAPAROSCOPIC APPENDECTOMY: SHX408

## 2013-05-03 LAB — WET PREP, GENITAL
Trich, Wet Prep: NONE SEEN
Yeast Wet Prep HPF POC: NONE SEEN

## 2013-05-03 SURGERY — APPENDECTOMY, LAPAROSCOPIC
Anesthesia: General | Site: Abdomen | Wound class: Clean Contaminated

## 2013-05-03 MED ORDER — FENTANYL CITRATE 0.05 MG/ML IJ SOLN
INTRAMUSCULAR | Status: DC | PRN
  Administered 2013-05-03: 100 ug via INTRAVENOUS

## 2013-05-03 MED ORDER — TIOTROPIUM BROMIDE MONOHYDRATE 18 MCG IN CAPS
18.0000 ug | ORAL_CAPSULE | Freq: Every day | RESPIRATORY_TRACT | Status: DC
  Administered 2013-05-04: 18 ug via RESPIRATORY_TRACT
  Filled 2013-05-03: qty 5

## 2013-05-03 MED ORDER — ACETAMINOPHEN 325 MG PO TABS
650.0000 mg | ORAL_TABLET | ORAL | Status: DC | PRN
  Administered 2013-05-03: 650 mg via ORAL
  Filled 2013-05-03: qty 2

## 2013-05-03 MED ORDER — HYDROMORPHONE HCL PF 1 MG/ML IJ SOLN
INTRAMUSCULAR | Status: AC
  Filled 2013-05-03: qty 1

## 2013-05-03 MED ORDER — LIDOCAINE HCL (CARDIAC) 20 MG/ML IV SOLN
INTRAVENOUS | Status: DC | PRN
  Administered 2013-05-03: 100 mg via INTRAVENOUS
  Administered 2013-05-03 (×3): 50 mg via INTRAVENOUS

## 2013-05-03 MED ORDER — OXYCODONE HCL 5 MG/5ML PO SOLN: 5.0000 mg | Freq: Once | ORAL | Status: DC | PRN

## 2013-05-03 MED ORDER — BUPIVACAINE-EPINEPHRINE 0.25% -1:200000 IJ SOLN
INTRAMUSCULAR | Status: DC | PRN
  Administered 2013-05-03: 18 mL

## 2013-05-03 MED ORDER — MORPHINE SULFATE 4 MG/ML IJ SOLN
4.0000 mg | Freq: Once | INTRAMUSCULAR | Status: AC
  Administered 2013-05-03: 4 mg via INTRAVENOUS
  Filled 2013-05-03: qty 1

## 2013-05-03 MED ORDER — KCL IN DEXTROSE-NACL 20-5-0.45 MEQ/L-%-% IV SOLN
INTRAVENOUS | Status: DC
  Administered 2013-05-03: 11:00:00 via INTRAVENOUS
  Filled 2013-05-03 (×2): qty 1000

## 2013-05-03 MED ORDER — SODIUM CHLORIDE 0.9 % IR SOLN
Status: DC | PRN
  Administered 2013-05-03: 1500 mL

## 2013-05-03 MED ORDER — HYDROMORPHONE HCL PF 1 MG/ML IJ SOLN
0.2500 mg | INTRAMUSCULAR | Status: DC | PRN
  Administered 2013-05-03 (×2): 0.5 mg via INTRAVENOUS

## 2013-05-03 MED ORDER — SODIUM CHLORIDE 0.9 % IV SOLN
3.0000 g | INTRAVENOUS | Status: AC
  Administered 2013-05-03: 3 g via INTRAVENOUS
  Filled 2013-05-03: qty 3

## 2013-05-03 MED ORDER — GLYCOPYRROLATE 0.2 MG/ML IJ SOLN
INTRAMUSCULAR | Status: DC | PRN
  Administered 2013-05-03: 0.6 mg via INTRAVENOUS

## 2013-05-03 MED ORDER — OXYCODONE HCL 5 MG PO TABS: 5.0000 mg | ORAL_TABLET | Freq: Once | ORAL | Status: DC | PRN

## 2013-05-03 MED ORDER — NEOSTIGMINE METHYLSULFATE 1 MG/ML IJ SOLN
INTRAMUSCULAR | Status: DC | PRN
  Administered 2013-05-03: 4 mg via INTRAVENOUS

## 2013-05-03 MED ORDER — PROPOFOL 10 MG/ML IV BOLUS
INTRAVENOUS | Status: DC | PRN
  Administered 2013-05-03: 200 mg via INTRAVENOUS

## 2013-05-03 MED ORDER — KCL IN DEXTROSE-NACL 20-5-0.45 MEQ/L-%-% IV SOLN
INTRAVENOUS | Status: AC
  Filled 2013-05-03: qty 1000

## 2013-05-03 MED ORDER — OXYCODONE-ACETAMINOPHEN 5-325 MG PO TABS
1.0000 | ORAL_TABLET | ORAL | Status: DC | PRN
  Administered 2013-05-03 – 2013-05-04 (×2): 2 via ORAL
  Filled 2013-05-03 (×2): qty 2

## 2013-05-03 MED ORDER — ENOXAPARIN SODIUM 40 MG/0.4ML ~~LOC~~ SOLN
40.0000 mg | SUBCUTANEOUS | Status: DC
  Administered 2013-05-04: 40 mg via SUBCUTANEOUS
  Filled 2013-05-03 (×2): qty 0.4

## 2013-05-03 MED ORDER — ONDANSETRON HCL 4 MG/2ML IJ SOLN: 4.0000 mg | Freq: Once | INTRAMUSCULAR | Status: DC | PRN

## 2013-05-03 MED ORDER — IOHEXOL 300 MG/ML  SOLN
100.0000 mL | Freq: Once | INTRAMUSCULAR | Status: AC | PRN
  Administered 2013-05-03: 100 mL via INTRAVENOUS

## 2013-05-03 MED ORDER — MOMETASONE FURO-FORMOTEROL FUM 100-5 MCG/ACT IN AERO
2.0000 | INHALATION_SPRAY | Freq: Two times a day (BID) | RESPIRATORY_TRACT | Status: DC
  Administered 2013-05-04: 2 via RESPIRATORY_TRACT
  Filled 2013-05-03: qty 8.8

## 2013-05-03 MED ORDER — ONDANSETRON HCL 4 MG/2ML IJ SOLN
4.0000 mg | Freq: Once | INTRAMUSCULAR | Status: AC
  Administered 2013-05-03: 4 mg via INTRAVENOUS
  Filled 2013-05-03: qty 2

## 2013-05-03 MED ORDER — SUCCINYLCHOLINE CHLORIDE 20 MG/ML IJ SOLN
INTRAMUSCULAR | Status: DC | PRN
  Administered 2013-05-03: 100 mg via INTRAVENOUS

## 2013-05-03 MED ORDER — ONDANSETRON HCL 4 MG PO TABS: 4.0000 mg | ORAL_TABLET | Freq: Four times a day (QID) | ORAL | Status: DC | PRN

## 2013-05-03 MED ORDER — IOHEXOL 300 MG/ML  SOLN: 50.0000 mL | Freq: Once | INTRAMUSCULAR | Status: DC | PRN

## 2013-05-03 MED ORDER — MIDAZOLAM HCL 5 MG/5ML IJ SOLN
INTRAMUSCULAR | Status: DC | PRN
  Administered 2013-05-03: 2 mg via INTRAVENOUS

## 2013-05-03 MED ORDER — ROCURONIUM BROMIDE 100 MG/10ML IV SOLN
INTRAVENOUS | Status: DC | PRN
  Administered 2013-05-03: 10 mg via INTRAVENOUS
  Administered 2013-05-03: 20 mg via INTRAVENOUS

## 2013-05-03 MED ORDER — ALBUTEROL SULFATE HFA 108 (90 BASE) MCG/ACT IN AERS
2.0000 | INHALATION_SPRAY | Freq: Four times a day (QID) | RESPIRATORY_TRACT | Status: DC | PRN
  Administered 2013-05-03 – 2013-05-04 (×2): 2 via RESPIRATORY_TRACT
  Filled 2013-05-03: qty 6.7

## 2013-05-03 MED ORDER — ONDANSETRON HCL 4 MG/2ML IJ SOLN
4.0000 mg | Freq: Four times a day (QID) | INTRAMUSCULAR | Status: DC | PRN
  Administered 2013-05-03: 4 mg via INTRAVENOUS
  Filled 2013-05-03: qty 2

## 2013-05-03 MED ORDER — ONDANSETRON HCL 4 MG/2ML IJ SOLN
INTRAMUSCULAR | Status: DC | PRN
  Administered 2013-05-03: 4 mg via INTRAVENOUS

## 2013-05-03 MED ORDER — LACTATED RINGERS IV SOLN
INTRAVENOUS | Status: DC | PRN
  Administered 2013-05-03 (×2): via INTRAVENOUS

## 2013-05-03 MED ORDER — HYDROMORPHONE HCL PF 1 MG/ML IJ SOLN
1.0000 mg | INTRAMUSCULAR | Status: DC | PRN
  Administered 2013-05-03: 2 mg via INTRAVENOUS
  Filled 2013-05-03: qty 2

## 2013-05-03 SURGICAL SUPPLY — 41 items
APPLIER CLIP ROT 10 11.4 M/L (STAPLE)
BLADE SURG ROTATE 9660 (MISCELLANEOUS) IMPLANT
CANISTER SUCTION 2500CC (MISCELLANEOUS) ×2 IMPLANT
CHLORAPREP W/TINT 26ML (MISCELLANEOUS) ×2 IMPLANT
CLIP APPLIE ROT 10 11.4 M/L (STAPLE) IMPLANT
CLOTH BEACON ORANGE TIMEOUT ST (SAFETY) ×2 IMPLANT
COVER SURGICAL LIGHT HANDLE (MISCELLANEOUS) ×2 IMPLANT
CUTTER LINEAR ENDO 35 ETS (STAPLE) IMPLANT
CUTTER LINEAR ENDO 35 ETS TH (STAPLE) ×2 IMPLANT
DECANTER SPIKE VIAL GLASS SM (MISCELLANEOUS) ×2 IMPLANT
DERMABOND ADVANCED (GAUZE/BANDAGES/DRESSINGS) ×1
DERMABOND ADVANCED .7 DNX12 (GAUZE/BANDAGES/DRESSINGS) ×1 IMPLANT
DRAPE UTILITY 15X26 W/TAPE STR (DRAPE) ×4 IMPLANT
ELECT REM PT RETURN 9FT ADLT (ELECTROSURGICAL) ×2
ELECTRODE REM PT RTRN 9FT ADLT (ELECTROSURGICAL) ×1 IMPLANT
ENDOLOOP SUT PDS II  0 18 (SUTURE)
ENDOLOOP SUT PDS II 0 18 (SUTURE) IMPLANT
GLOVE BIOGEL PI IND STRL 8 (GLOVE) ×1 IMPLANT
GLOVE BIOGEL PI INDICATOR 8 (GLOVE) ×1
GLOVE ECLIPSE 7.5 STRL STRAW (GLOVE) ×2 IMPLANT
GOWN STRL NON-REIN LRG LVL3 (GOWN DISPOSABLE) ×4 IMPLANT
KIT BASIN OR (CUSTOM PROCEDURE TRAY) ×2 IMPLANT
KIT ROOM TURNOVER OR (KITS) ×2 IMPLANT
NS IRRIG 1000ML POUR BTL (IV SOLUTION) ×2 IMPLANT
PAD ARMBOARD 7.5X6 YLW CONV (MISCELLANEOUS) ×4 IMPLANT
PENCIL BUTTON HOLSTER BLD 10FT (ELECTRODE) IMPLANT
POUCH SPECIMEN RETRIEVAL 10MM (ENDOMECHANICALS) ×2 IMPLANT
RELOAD /EVU35 (ENDOMECHANICALS) IMPLANT
RELOAD CUTTER ETS 35MM STAND (ENDOMECHANICALS) ×2 IMPLANT
SCISSORS ENDO CVD 5DCS (MISCELLANEOUS) ×2 IMPLANT
SET IRRIG TUBING LAPAROSCOPIC (IRRIGATION / IRRIGATOR) ×2 IMPLANT
SPECIMEN JAR SMALL (MISCELLANEOUS) ×2 IMPLANT
SUT MNCRL AB 4-0 PS2 18 (SUTURE) ×2 IMPLANT
TOWEL OR 17X24 6PK STRL BLUE (TOWEL DISPOSABLE) ×2 IMPLANT
TOWEL OR 17X26 10 PK STRL BLUE (TOWEL DISPOSABLE) ×2 IMPLANT
TRAY FOLEY CATH 14FR (SET/KITS/TRAYS/PACK) ×2 IMPLANT
TRAY LAPAROSCOPIC (CUSTOM PROCEDURE TRAY) ×2 IMPLANT
TROCAR XCEL 12X100 BLDLESS (ENDOMECHANICALS) ×2 IMPLANT
TROCAR XCEL BLUNT TIP 100MML (ENDOMECHANICALS) ×2 IMPLANT
TROCAR XCEL NON-BLD 5MMX100MML (ENDOMECHANICALS) ×2 IMPLANT
WATER STERILE IRR 1000ML POUR (IV SOLUTION) IMPLANT

## 2013-05-03 NOTE — Progress Notes (Signed)
Pt tolerated ambulation in hallway, 500 ft with 1 person assist & O2 , gait steady Egbert Garibaldi A

## 2013-05-03 NOTE — Preoperative (Signed)
Beta Blockers   Reason not to administer Beta Blockers:Not Applicable. No home beta blockers 

## 2013-05-03 NOTE — Transfer of Care (Signed)
Immediate Anesthesia Transfer of Care Note  Patient: Mary [name]  Procedure(s) Performed: Procedure(s): APPENDECTOMY LAPAROSCOPIC (N/A)  Patient Location: PACU  Anesthesia Type:General  Level of Consciousness: confused  Airway & Oxygen Therapy: Patient Spontanous Breathing and Patient connected to face mask oxygen  Post-op Assessment: Report given to PACU RN and Post -op Vital signs reviewed and stable  Post vital signs: Reviewed and stable  Complications: No apparent anesthesia complications

## 2013-05-03 NOTE — Progress Notes (Signed)
Utilization Review Completed.Mary [name] T5/29/2014  

## 2013-05-03 NOTE — Anesthesia Preprocedure Evaluation (Addendum)
Anesthesia Evaluation  Patient identified by MRN, date of birth, ID band Patient awake    Reviewed: Allergy & Precautions, H&P , NPO status , Patient's Chart, lab work & pertinent test results, reviewed documented beta blocker date and time   Airway Mallampati: I TM Distance: >3 FB Neck ROM: Full    Dental  (+) Dental Advisory Given and Teeth Intact   Pulmonary shortness of breath and Long-Term Oxygen Therapy, asthma , sleep apnea (Uses BIPAP at home when sleeping) , COPD COPD inhaler and oxygen dependent,  breath sounds clear to auscultation        Cardiovascular + Peripheral Vascular Disease and +CHF Rhythm:Regular Rate:Normal     Neuro/Psych    GI/Hepatic   Endo/Other  Morbid obesity  Renal/GU      Musculoskeletal   Abdominal   Peds  Hematology  (+) Blood dyscrasia, ,   Anesthesia Other Findings Unknown hx of "heart surgery" as a child.  Recognized terms like RV failure and Pulmonary HTN.  However, her echo shows no valvular problems and normal systolic function.   Reproductive/Obstetrics                          Anesthesia Physical Anesthesia Plan  ASA: III  Anesthesia Plan: General   Post-op Pain Management:    Induction: Intravenous and Cricoid pressure planned  Airway Management Planned: Oral ETT  Additional Equipment:   Intra-op Plan:   Post-operative Plan: Extubation in OR  Informed Consent: I have reviewed the patients History and Physical, chart, labs and discussed the procedure including the risks, benefits and alternatives for the proposed anesthesia with the patient or authorized representative who has indicated his/her understanding and acceptance.   Dental advisory given  Plan Discussed with: Anesthesiologist, Surgeon and CRNA  Anesthesia Plan Comments:        Anesthesia Quick Evaluation

## 2013-05-03 NOTE — Anesthesia Postprocedure Evaluation (Signed)
  Anesthesia Post-op Note  Patient: Mary [name]  Procedure(s) Performed: Procedure(s): APPENDECTOMY LAPAROSCOPIC (N/A)  Patient Location: PACU  Anesthesia Type:General  Level of Consciousness: awake, alert  and oriented  Airway and Oxygen Therapy: Patient Spontanous Breathing and Patient connected to nasal cannula oxygen  Post-op Pain: mild  Post-op Assessment: Post-op Vital signs reviewed  Post-op Vital Signs: Reviewed  Complications: No apparent anesthesia complications

## 2013-05-03 NOTE — ED Provider Notes (Signed)
History     CSN: [id number]  Arrival date & time 05/02/13  2131   First MD Initiated Contact with Patient 05/02/13 2316      Chief Complaint  Patient presents with  . Abdominal Pain    (Consider location/radiation/quality/duration/timing/severity/associated sxs/prior treatment) HPI 37 year old female presents to emergency room and from urgent care with concern for appendicitis.  Patient reports she woke this morning with some upper abdominal discomfort.  Throughout the day this pain has spread down to her lower right abdomen.  Patient reports pain is intermittent, sharp, at times crampy, others.  She has had one episode of vomiting.  No fevers.  Her last menstrual period ended today.  She denies any urinary symptoms.  She denies previous history of kidney stones.  Pain is not in the flank.  She denies pain is in the location or type that she would normally have with menstrual cramps.  Past Medical History  Diagnosis Date  . COPD (chronic obstructive pulmonary disease)     PFTS 03/08/12: fev1 0.58L/1%, FVC 1.18/33%, Ratop 49 and c/w ssevere obstruction. 21% BD response on FVC, RV 219%, DLCO 11/54%  . Chronic asthma   . History of alcoholism   . Tobacco abuse     quit 02/2012  . Irregular menses   . DVT of upper extremity (deep vein thrombosis) April 2013    right subclavian // Unclear precipitating cause - possibly significant right heart failure, with vascular stasis potentially predisposing to clotting.    . Pulmonary embolus April 2013    Precipitating cause unclear. Was treated with coumadin from April-June 2013.  . Moderate to severe pulmonary hypertension April 2013    Cardiac cath on 03/06/12 - 1. Elevated pulmonary artery pressures, right sided filling pressures.,  2. PA: 64/45 (mean 53)    . OSA (obstructive sleep apnea)     wears noctural BiPAP  . Hepatic cirrhosis     Questionable history of - Noted on CT abdomen (03/2012) - thought to be due to vascular congestion from right  heart failure +/- patient's history of alcohol abuse  . Axillary adenopathy     right axillary adenopathy noted on CT chest (03/06/2012)  . Periodontal disease   . Chronic right-sided CHF (congestive heart failure) 03/23/2012    with cor pulmonale. Last RHC 03/2012  . Shortness of breath     Past Surgical History  Procedure Laterality Date  . Cardiac surgery    . Lung surgery      Family History  Problem Relation Age of Onset  . Hypertension Father   . Heart disease Father     CHF; died age 3   . Alcohol abuse Father   . Other Brother     died age 28 y.o overdose    History  Substance Use Topics  . Smoking status: Former Smoker -- 1.00 packs/day for 25 years    Types: Cigarettes    Quit date: 02/18/2012  . Smokeless tobacco: Never Used     Comment: quit smoking 1 month ago  . Alcohol Use: No     Comment: previously drinking 1 pint 3-4 days a week for 2002-2013, but in 2014, no longer drinking    OB History   Grav Para Term Preterm Abortions TAB SAB Ect Mult Living                  Review of Systems  All other systems reviewed and are negative.    Allergies  Review of patient's  allergies indicates no known allergies.  Home Medications   Current Outpatient Rx  Name  Route  Sig  Dispense  Refill  . albuterol (VENTOLIN HFA) 108 (90 BASE) MCG/ACT inhaler   Inhalation   Inhale 2 puffs into the lungs every 6 (six) hours as needed for wheezing.   18 each   0     Pt needs appt for more refills   . Fluticasone-Salmeterol (ADVAIR) 250-50 MCG/DOSE AEPB   Inhalation   Inhale 1 puff into the lungs every 12 (twelve) hours.   60 each   0   . tiotropium (SPIRIVA) 18 MCG inhalation capsule   Inhalation   Place 1 capsule (18 mcg total) into inhaler and inhale daily.   30 capsule   5     BP 117/82  Pulse 75  Temp(Src) 98.8 F (37.1 C) (Oral)  Resp 20  SpO2 98%  LMP 04/27/2013  Physical Exam  Nursing note and vitals reviewed. Constitutional: She is  oriented to person, place, and time. She appears well-developed and well-nourished. She appears distressed.  HENT:  Head: Normocephalic and atraumatic.  Nose: Nose normal.  Mouth/Throat: Oropharynx is clear and moist.  Eyes: Conjunctivae and EOM are normal. Pupils are equal, round, and reactive to light.  Neck: Normal range of motion. Neck supple. No JVD present. No tracheal deviation present. No thyromegaly present.  Cardiovascular: Normal rate, regular rhythm, normal heart sounds and intact distal pulses.  Exam reveals no gallop and no friction rub.   No murmur heard. Pulmonary/Chest: Effort normal and breath sounds normal. No stridor. No respiratory distress. She has no wheezes. She has no rales. She exhibits no tenderness.  Abdominal: Soft. Bowel sounds are normal. She exhibits no distension and no mass. There is tenderness (tenderness of palpation to right lower abdomen, mild rebound and referred pain). There is rebound. There is no guarding.  Genitourinary:  External genitalia normal Vagina with scant, brown discharge Cervix closed no lesions No cervical motion tenderness Adnexa palpated, no masses or tenderness noted Bladder palpated no tenderness Uterus palpated no masses or tenderness    Musculoskeletal: Normal range of motion. She exhibits no edema and no tenderness.  Lymphadenopathy:    She has no cervical adenopathy.  Neurological: She is alert and oriented to person, place, and time. She exhibits normal muscle tone. Coordination normal.  Skin: Skin is warm and dry. No rash noted. No erythema. No pallor.  Psychiatric: She has a normal mood and affect. Her behavior is normal. Judgment and thought content normal.    ED Course  Procedures (including critical care time)  Labs Reviewed  WET PREP, GENITAL - Abnormal; Notable for the following:    Clue Cells Wet Prep HPF POC MODERATE (*)    WBC, Wet Prep HPF POC MODERATE (*)    All other components within normal limits   URINALYSIS, ROUTINE W REFLEX MICROSCOPIC - Abnormal; Notable for the following:    Color, Urine AMBER (*)    APPearance CLOUDY (*)    Hgb urine dipstick LARGE (*)    Bilirubin Urine MODERATE (*)    Ketones, ur 15 (*)    All other components within normal limits  CBC WITH DIFFERENTIAL - Abnormal; Notable for the following:    WBC 13.3 (*)    Hemoglobin 16.0 (*)    Neutro Abs 9.8 (*)    Monocytes Absolute 1.2 (*)    All other components within normal limits  COMPREHENSIVE METABOLIC PANEL - Abnormal; Notable for the following:  Chloride 95 (*)    All other components within normal limits  URINE MICROSCOPIC-ADD ON - Abnormal; Notable for the following:    Squamous Epithelial / LPF MANY (*)    Bacteria, UA FEW (*)    All other components within normal limits  GC/CHLAMYDIA PROBE AMP  PROTIME-INR  POCT PREGNANCY, URINE   Ct Abdomen Pelvis W Contrast  05/03/2013   *RADIOLOGY REPORT*  Clinical Data: Right lower quadrant abdominal pain; vomiting.  CT ABDOMEN AND PELVIS WITH CONTRAST  Technique:  Multidetector CT imaging of the abdomen and pelvis was performed following the standard protocol during bolus administration of intravenous contrast.  Contrast: OMNIPAQUE IOHEXOL 300 MG/ML  SOLN  Comparison: Abdominal ultrasound performed 04/07/2012, and CT of the abdomen and pelvis performed 03/07/2012  Findings: The visualized lung bases are clear.  The liver and spleen are unremarkable in appearance.  The gallbladder is within normal limits.  The pancreas and adrenal glands are unremarkable.  The kidneys are unremarkable in appearance.  There is no evidence of hydronephrosis.  No renal or ureteral stones are seen.  No perinephric stranding is appreciated.  No free fluid is identified.  The small bowel is unremarkable in appearance.  The stomach is within normal limits.  No acute vascular abnormalities are seen.  The appendix dilated to 1.0 cm in maximal diameter, with mildly increased wall  enhancement and surrounding soft tissue inflammation.  Trace associated free fluid is seen at the right lower quadrant.  There is no evidence of perforation or abscess formation.  The colon is unremarkable in appearance.  The bladder is mildly distended and grossly unremarkable in appearance.  A small urachal remnant is seen.  The uterus is within normal limits.  The ovaries are relatively symmetric; no suspicious adnexal masses are seen.  No inguinal lymphadenopathy is seen.  No acute osseous abnormalities are identified.  IMPRESSION: Mild acute appendicitis noted, with dilatation of the appendix to 1.0 cm in maximal diameter, surrounding soft tissue inflammation and trace free fluid.  No evidence of perforation or abscess formation at this time.  These results were called by telephone on 05/03/2013 at 02:50 a.m. to Dr. Marisa Severin, who verbally acknowledged these results.   Original Report Authenticated By: Tonia Ghent, M.D.     1. Appendicitis       MDM  37 year old female with right lower quadrant abdominal pain.  Pain does not appear to originate from her pelvic organs.  Based on pelvic exam.  We'll get CT abdomen pelvis for further investigation        Olivia Mackie, MD 05/03/13 [phone]

## 2013-05-03 NOTE — H&P (Signed)
Mary [name] is an 37 y.o. female.   Chief Complaint: Abdominal pain HPI: Her abdominal pain started in the upper abdomen yesterday morning and would cycle and worsen throughout the day associated with nausea but no vomiting.  No fevers or chills.  Went to Urgent Care originally and sent to the ED for CT.  Originally arrived about 6 pm.  CT demonstrated acute appendicitis.  Currently does not appear to be ruptured clinically or radiologically.  Past Medical History  Diagnosis Date  . COPD (chronic obstructive pulmonary disease)     PFTS 03/08/12: fev1 0.58L/1%, FVC 1.18/33%, Ratop 49 and c/w ssevere obstruction. 21% BD response on FVC, RV 219%, DLCO 11/54%  . Chronic asthma   . History of alcoholism   . Tobacco abuse     quit 02/2012  . Irregular menses   . DVT of upper extremity (deep vein thrombosis) April 2013    right subclavian // Unclear precipitating cause - possibly significant right heart failure, with vascular stasis potentially predisposing to clotting.    . Pulmonary embolus April 2013    Precipitating cause unclear. Was treated with coumadin from April-June 2013.  . Moderate to severe pulmonary hypertension April 2013    Cardiac cath on 03/06/12 - 1. Elevated pulmonary artery pressures, right sided filling pressures.,  2. PA: 64/45 (mean 53)    . OSA (obstructive sleep apnea)     wears noctural BiPAP  . Hepatic cirrhosis     Questionable history of - Noted on CT abdomen (03/2012) - thought to be due to vascular congestion from right heart failure +/- patient's history of alcohol abuse  . Axillary adenopathy     right axillary adenopathy noted on CT chest (03/06/2012)  . Periodontal disease   . Chronic right-sided CHF (congestive heart failure) 03/23/2012    with cor pulmonale. Last RHC 03/2012  . Shortness of breath     Past Surgical History  Procedure Laterality Date  . Cardiac surgery    . Lung surgery      Family History  Problem Relation Age of Onset  .  Hypertension Father   . Heart disease Father     CHF; died age 63   . Alcohol abuse Father   . Other Brother     died age 48 y.o overdose   Social History:  reports that she quit smoking about 14 months ago. Her smoking use included Cigarettes. She has a 25 pack-year smoking history. She has never used smokeless tobacco. She reports that she does not drink alcohol or use illicit drugs.  Allergies: No Known Allergies   (Not in a hospital admission)  Results for orders placed during the hospital encounter of 05/02/13 (from the past 48 hour(s))  CBC WITH DIFFERENTIAL     Status: Abnormal   Collection Time    05/02/13  9:55 PM      Result Value Range   WBC 13.3 (*) 4.0 - 10.5 K/uL   RBC 5.01  3.87 - 5.11 MIL/uL   Hemoglobin 16.0 (*) 12.0 - 15.0 g/dL   HCT 16.1  09.6 - 04.5 %   MCV 91.0  78.0 - 100.0 fL   MCH 31.9  26.0 - 34.0 pg   MCHC 35.1  30.0 - 36.0 g/dL   RDW 40.9  81.1 - 91.4 %   Platelets 229  150 - 400 K/uL   Neutrophils Relative % 74  43 - 77 %   Neutro Abs 9.8 (*) 1.7 - 7.7 K/uL  Lymphocytes Relative 16  12 - 46 %   Lymphs Abs 2.2  0.7 - 4.0 K/uL   Monocytes Relative 9  3 - 12 %   Monocytes Absolute 1.2 (*) 0.1 - 1.0 K/uL   Eosinophils Relative 1  0 - 5 %   Eosinophils Absolute 0.2  0.0 - 0.7 K/uL   Basophils Relative 0  0 - 1 %   Basophils Absolute 0.0  0.0 - 0.1 K/uL  COMPREHENSIVE METABOLIC PANEL     Status: Abnormal   Collection Time    05/02/13  9:55 PM      Result Value Range   Sodium 137  135 - 145 mEq/L   Potassium 3.8  3.5 - 5.1 mEq/L   Chloride 95 (*) 96 - 112 mEq/L   CO2 31  19 - 32 mEq/L   Glucose, Bld 98  70 - 99 mg/dL   BUN 9  6 - 23 mg/dL   Creatinine, Ser 1.61  0.50 - 1.10 mg/dL   Calcium 09.6  8.4 - 04.5 mg/dL   Total Protein 8.1  6.0 - 8.3 g/dL   Albumin 4.4  3.5 - 5.2 g/dL   AST 19  0 - 37 U/L   ALT 14  0 - 35 U/L   Alkaline Phosphatase 81  39 - 117 U/L   Total Bilirubin 0.7  0.3 - 1.2 mg/dL   GFR calc non Af Amer >90  >90 mL/min    GFR calc Af Amer >90  >90 mL/min   Comment:            The eGFR has been calculated     using the CKD EPI equation.     This calculation has not been     validated in all clinical     situations.     eGFR's persistently     <90 mL/min signify     possible Chronic Kidney Disease.  POCT PREGNANCY, URINE     Status: None   Collection Time    05/02/13 10:07 PM      Result Value Range   Preg Test, Ur NEGATIVE  NEGATIVE   Comment:            THE SENSITIVITY OF THIS     METHODOLOGY IS >24 mIU/mL  URINALYSIS, ROUTINE W REFLEX MICROSCOPIC     Status: Abnormal   Collection Time    05/02/13 10:11 PM      Result Value Range   Color, Urine AMBER (*) YELLOW   Comment: BIOCHEMICALS MAY BE AFFECTED BY COLOR   APPearance CLOUDY (*) CLEAR   Specific Gravity, Urine 1.027  1.005 - 1.030   pH 5.5  5.0 - 8.0   Glucose, UA NEGATIVE  NEGATIVE mg/dL   Hgb urine dipstick LARGE (*) NEGATIVE   Bilirubin Urine MODERATE (*) NEGATIVE   Ketones, ur 15 (*) NEGATIVE mg/dL   Protein, ur NEGATIVE  NEGATIVE mg/dL   Urobilinogen, UA 0.2  0.0 - 1.0 mg/dL   Nitrite NEGATIVE  NEGATIVE   Leukocytes, UA NEGATIVE  NEGATIVE  URINE MICROSCOPIC-ADD ON     Status: Abnormal   Collection Time    05/02/13 10:11 PM      Result Value Range   Squamous Epithelial / LPF MANY (*) RARE   RBC / HPF 3-6  <3 RBC/hpf   Bacteria, UA FEW (*) RARE  WET PREP, GENITAL     Status: Abnormal   Collection Time    05/03/13 12:25 AM  Result Value Range   Yeast Wet Prep HPF POC NONE SEEN  NONE SEEN   Trich, Wet Prep NONE SEEN  NONE SEEN   Clue Cells Wet Prep HPF POC MODERATE (*) NONE SEEN   WBC, Wet Prep HPF POC MODERATE (*) NONE SEEN   Ct Abdomen Pelvis W Contrast  05/03/2013   *RADIOLOGY REPORT*  Clinical Data: Right lower quadrant abdominal pain; vomiting.  CT ABDOMEN AND PELVIS WITH CONTRAST  Technique:  Multidetector CT imaging of the abdomen and pelvis was performed following the standard protocol during bolus administration of  intravenous contrast.  Contrast: OMNIPAQUE IOHEXOL 300 MG/ML  SOLN  Comparison: Abdominal ultrasound performed 04/07/2012, and CT of the abdomen and pelvis performed 03/07/2012  Findings: The visualized lung bases are clear.  The liver and spleen are unremarkable in appearance.  The gallbladder is within normal limits.  The pancreas and adrenal glands are unremarkable.  The kidneys are unremarkable in appearance.  There is no evidence of hydronephrosis.  No renal or ureteral stones are seen.  No perinephric stranding is appreciated.  No free fluid is identified.  The small bowel is unremarkable in appearance.  The stomach is within normal limits.  No acute vascular abnormalities are seen.  The appendix dilated to 1.0 cm in maximal diameter, with mildly increased wall enhancement and surrounding soft tissue inflammation.  Trace associated free fluid is seen at the right lower quadrant.  There is no evidence of perforation or abscess formation.  The colon is unremarkable in appearance.  The bladder is mildly distended and grossly unremarkable in appearance.  A small urachal remnant is seen.  The uterus is within normal limits.  The ovaries are relatively symmetric; no suspicious adnexal masses are seen.  No inguinal lymphadenopathy is seen.  No acute osseous abnormalities are identified.  IMPRESSION: Mild acute appendicitis noted, with dilatation of the appendix to 1.0 cm in maximal diameter, surrounding soft tissue inflammation and trace free fluid.  No evidence of perforation or abscess formation at this time.  These results were called by telephone on 05/03/2013 at 02:50 a.m. to Dr. Marisa Severin, who verbally acknowledged these results.   Original Report Authenticated By: Tonia Ghent, M.D.    Review of Systems  Constitutional: Negative for fever and chills.  HENT: Negative.   Eyes: Negative.   Respiratory: Positive for cough (chronic) and shortness of breath (chronic).   Cardiovascular: Negative.     Gastrointestinal: Positive for nausea and abdominal pain. Negative for vomiting.  Genitourinary: Negative.   Musculoskeletal: Negative.   Skin: Negative.   Neurological: Negative.   Endo/Heme/Allergies: Negative.   Psychiatric/Behavioral: Negative.     Blood pressure 119/81, pulse 70, temperature 98.8 F (37.1 C), temperature source Oral, resp. rate 20, last menstrual period 04/27/2013, SpO2 97.00%. Physical Exam  Constitutional: She is oriented to person, place, and time. She appears well-developed and well-nourished.  Moderately obese  HENT:  Head: Normocephalic and atraumatic.  Eyes: Conjunctivae and lids are normal. Pupils are equal, round, and reactive to light. Left eye exhibits abnormal extraocular motion (dysconjugate gaze).  Neck: Normal range of motion. Neck supple.  Cardiovascular: Normal rate and normal heart sounds.   No murmur heard. Respiratory: Effort normal and breath sounds normal.    GI: Soft. Normal appearance. She exhibits distension. She exhibits no shifting dullness, no pulsatile liver and no fluid wave. Bowel sounds are decreased. There is tenderness (positive Rovsing's sign) in the right lower quadrant. There is rebound, guarding and tenderness at McBurney's  point. There is no rigidity.  Musculoskeletal: Normal range of motion.  Neurological: She is alert and oriented to person, place, and time.  Skin: Skin is warm.  Psychiatric: She has a normal mood and affect. Her behavior is normal. Thought content normal.     Assessment/Plan Acute appendicitis in 37 year old female with history of severe COPD and right heart failure (currently controlled).  Patient has history of RUE DVT, but has not been on coumadin for approximately one year. Although this patient is as some risk for surgery, she should be able to get through a laparoscopic appendectomy.  Will go ahead and proceed ASAP.   Unasyn on call to the OR.  Cherylynn Ridges 05/03/2013, 4:02 AM

## 2013-05-03 NOTE — Op Note (Signed)
OPERATIVE REPORT  DATE OF OPERATION: 05/02/2013 - 05/03/2013  PATIENT:  Mary [name]  37 y.o. female  PRE-OPERATIVE DIAGNOSIS:  Acute appendicitis  POST-OPERATIVE DIAGNOSIS:  Acute appendicitis  PROCEDURE:  Procedure(s): APPENDECTOMY LAPAROSCOPIC  SURGEON:  Surgeon(s): Cherylynn Ridges, MD  ASSISTANT: None  ANESTHESIA:   general  EBL: <30 ml  BLOOD ADMINISTERED: none  DRAINS: none   SPECIMEN:  Source of Specimen:  Appendix  COUNTS CORRECT:  YES  PROCEDURE DETAILS: The patient was taken to the operating room and placed on the table in the supine position. After an adequate general endotracheal anesthetic was administered, she was prepped and draped in usual sterile manner exposing her entire abdomen.  After proper time out was performed identifying the patient and the procedure to be performed, a supraumbilical midline incision was made using a #15 blade and taken down to the midline fascia. We then incised the midline fascia using a #15 blade. A Kocher clamp was placed along the fascial margins and lifted it up as we bluntly dissected down into the peritoneal cavity with a Kelly clamp. Once we were in the peritoneal cavity a 0 Vicryl was passed around the fascial opening in a pursestring manner. This secured in a Hassan cannula which was subsequently passed into the peritoneal cavity.  Through the [facility] cannula carbon dioxide gas was insufflated up to a maximal intra-abdominal pressure of 15 mm of mercury. Subsequently a right upper quadrant 5 mm cannula in the left lower quadrant 12 mm cannula passed under direct vision into the peritoneal cavity.  The descending colon and cecum were tethered to the anterior abdominal wall and the right paracolic gutter by adhesions. These were taken down with sharp dissection with electrocautery and laparoscopic scissors. The terminal ileum also was tethered to the right posterior aspect of the peritoneal cavity. In order for [place] to mobilize the  appendix these adhesions had to be taken down sharply also.  As we did so we came across the appendiceal mesentery and had to cauterizeda the mesenteric vessel. We were eventually able to identify the base of the appendix as it went into the cecum. The mesoappendix was taken initially using an Endo GIA with 2.5 mm closure vascular. Stapler. We then used a blue Endo GIA staple across the base of the appendix at the cecum. This completely detached the appendix.  We retrieved the appendix through the left lower quadrant site using a Endo Catch bag. Once this was done we inspected the base of the appendix and the mesoappendix using the laparoscope. We irrigated with saline solution. There was no further bleeding.  We closed the supraumbilical fascia site using the pursestring suture which is in place. The patient was flattened. Once the procedure was done in the Trendelenburg position with the patient and the left lateral tilt position. Once she was flattened all cannulas were removed. Cortisone Marcaine with epinephrine was injected at all sites. We closed the skin using running subcuticular stitch of 4-0 Monocryl with the exception of a 5 mm which was closed primarily with Dermabond. Dermabond Steri-Strips and Tegaderms were used to complete our dressings all needle counts, sponge counts, and instrument counts were correct.  PATIENT DISPOSITION:  PACU - hemodynamically stable.   Cherylynn Ridges 5/29/20146:40 AM

## 2013-05-04 ENCOUNTER — Encounter (HOSPITAL_COMMUNITY): Payer: Self-pay | Admitting: General Surgery

## 2013-05-04 LAB — GC/CHLAMYDIA PROBE AMP
CT Probe RNA: NEGATIVE
GC Probe RNA: NEGATIVE

## 2013-05-04 MED ORDER — OXYCODONE HCL 5 MG PO TABS
5.0000 mg | ORAL_TABLET | ORAL | Status: DC | PRN
  Administered 2013-05-04: 10 mg via ORAL
  Filled 2013-05-04: qty 2

## 2013-05-04 MED ORDER — OXYCODONE HCL 5 MG PO TABS
5.0000 mg | ORAL_TABLET | ORAL | Status: DC | PRN
Start: 2013-05-04 — End: 2013-06-04

## 2013-05-04 MED ORDER — KCL IN DEXTROSE-NACL 20-5-0.45 MEQ/L-%-% IV SOLN
INTRAVENOUS | Status: DC
  Administered 2013-05-04: 03:00:00 via INTRAVENOUS
  Filled 2013-05-04 (×3): qty 1000

## 2013-05-04 MED ORDER — PROMETHAZINE HCL 25 MG/ML IJ SOLN: 12.5000 mg | INTRAMUSCULAR | Status: DC | PRN

## 2013-05-04 NOTE — Progress Notes (Signed)
Discharge instructions and prescriptions given to pt. Pt verbalized understanding of discharge instructions. Pt is stable for discharge. Will provide O2 for pt to go on.

## 2013-05-04 NOTE — Care Management Note (Signed)
    Page 1 of 1   05/04/2013     2:57:34 PM   CARE MANAGEMENT NOTE 05/04/2013  Patient:  Mary [name], Mary [name]   Account Number:  [id number]  Date Initiated:  05/04/2013  Documentation initiated by:  Tonee Silverstein  Subjective/Objective Assessment:   PT ADM 05/02/13 WITH ABD PAIN, ACUTE APPENDICITIS.  PTA, PT INDEPENDENT, AND LIVES AT HOME.  SHE IS ON CHRONIC HOME OXYGEN.     Action/Plan:   PT STATES HER PORTABLE HOME O2 TANK IS EMPTY.  PT'S OXYGEN CARRIER IS [facility].  [facility] NOTIFIED TO BRING PT A PORTABLE TANK FOR TRANSPORT HOME.   Anticipated DC Date:  05/04/2013   Anticipated DC Plan:  HOME/SELF CARE      DC Planning Services  CM consult      Choice offered to / List presented to:     DME arranged  [facility]      DME agency  [facility]        Status of service:  Completed, signed off Medicare Important Message given?   (If response is "NO", the following Medicare IM given date fields will be blank) Date Medicare IM given:   Date Additional Medicare IM given:    Discharge Disposition:  HOME/SELF CARE  Per UR Regulation:  Reviewed for med. necessity/level of care/duration of stay  If discussed at Long Length of Stay Meetings, dates discussed:    Comments:  05/04/13 Kameran Mcneese,RN,BSN [phone] PT STATES SHE IS HAVING DIFFICULTY PAYING FOR ADVAIR AND SPIRIVA WITH CURRENT INSURANCE.  NO MEDICATION ASSISTANCE PROGRAMS AVAILABLE WITH THESE MEDS, ONLY COUPONS.  PT IS FAMILIAR WITH [facility] WEBSITE.  BEDSIDE NURSE INSTRUCTED TO GIVE INHALERS FROM MED DRAWER.

## 2013-05-04 NOTE — Discharge Instructions (Signed)
[facility] ______[facility] °LAPAROSCOPIC SURGERY: POST OP INSTRUCTIONS °Always review your discharge instruction sheet given to you by the facility where your surgery was performed. °IF YOU HAVE DISABILITY OR FAMILY LEAVE FORMS, YOU MUST BRING THEM TO THE OFFICE FOR PROCESSING.   °DO NOT GIVE THEM TO YOUR DOCTOR. ° °1. A prescription for pain medication may be given to you upon discharge.  Take your pain medication as prescribed, if needed.  If narcotic pain medicine is not needed, then you may take acetaminophen (Tylenol) or ibuprofen (Advil) as needed. °2. Take your usually prescribed medications unless otherwise directed. °3. If you need a refill on your pain medication, please contact your pharmacy.  They will contact our office to request authorization. Prescriptions will not be filled after 5pm or on week-ends. °4. You should follow a light diet the first few days after arrival home, such as soup and crackers, etc.  Be sure to include lots of fluids daily. °5. Most patients will experience some swelling and bruising in the area of the incisions.  Ice packs will help.  Swelling and bruising can take several days to resolve.  °6. It is common to experience some constipation if taking pain medication after surgery.  Increasing fluid intake and taking a stool softener (such as Colace) will usually help or prevent this problem from occurring.  A mild laxative (Milk of Magnesia or Miralax) should be taken according to package instructions if there are no bowel movements after 48 hours. °7. Unless discharge instructions indicate otherwise, you may remove your bandages 24-48 hours after surgery, and you may shower at that time.  You may have steri-strips (small skin tapes) in place directly over the incision.  These strips should be left on the skin for 7-10 days.  If your surgeon used skin glue on the incision, you may shower in 24 hours.  The glue will flake off over the next 2-3 weeks.  Any sutures or  staples will be removed at the office during your follow-up visit. °8. ACTIVITIES:  You may resume regular (light) daily activities beginning the next day--such as daily self-care, walking, climbing stairs--gradually increasing activities as tolerated.  You may have sexual intercourse when it is comfortable.  Refrain from any heavy lifting or straining until approved by your doctor. °a. You may drive when you are no longer taking prescription pain medication, you can comfortably wear a seatbelt, and you can safely maneuver your car and apply brakes. °b. RETURN TO WORK:  __________________________________________________________ °9. You should see your doctor in the office for a follow-up appointment approximately 2-3 weeks after your surgery.  Make sure that you call for this appointment within a day or two after you arrive home to insure a convenient appointment time. °10. OTHER INSTRUCTIONS: __________________________________________________________________________________________________________________________ __________________________________________________________________________________________________________________________ °WHEN TO CALL YOUR DOCTOR: °1. Fever over 101.0 °2. Inability to urinate °3. Continued bleeding from incision. °4. Increased pain, redness, or drainage from the incision. °5. Increasing abdominal pain ° °The clinic staff is available to answer your questions during regular business hours.  Please don’t hesitate to call and ask to speak to one of the nurses for clinical concerns.  If you have a medical emergency, go to the nearest emergency room or call 911.  A surgeon from [facility] is always on call at the hospital. °[address], [address], [place], [place]  [address] ? [address] [address], , [place]   [address] °[phone] ? [phone] ? FAX [phone] °Web site:   [email or web address] °

## 2013-05-04 NOTE — Progress Notes (Signed)
Pt's  HR sustained in 112-120 at this time. Vomited moderate amount of  preciously ingested food, prn IV Zofran given with relief. MD on-call notified and new orders received. Resting in bed with eyes closed. No indication of pain of distress at this time. Will continue to monitor.

## 2013-05-04 NOTE — Progress Notes (Signed)
Pt ambulated in halls independently with O2 on. Pt tolerated well and back in bed with call bell in reach.

## 2013-05-04 NOTE — Discharge Summary (Signed)
  Physician Discharge Summary  Patient ID: Mary [name] MRN: [id number] DOB/AGE: 1976/02/02 36 y.o.  Admit date: 05/02/2013 Discharge date: 05/04/2013  Admitting Diagnosis: Acute Appendicitis  Discharge Diagnosis Acute Appendicitis Patient Active Problem List   Diagnosis Date Noted  . Hypokalemia 02/28/2013  . Acute-on-chronic respiratory failure 02/28/2013  . Financial difficulties 02/28/2013  . Obesity hypoventilation syndrome 02/28/2013  . Elevated troponin 02/28/2013  . Myalgia 02/28/2013  . Influenza with respiratory manifestations 02/28/2013  . COPD exacerbation 09/22/2012  . Anovulation 06/30/2012  . Preventative health care 05/05/2012  . Irregular menstrual cycle 04/27/2012  . Chronic right-sided CHF (congestive heart failure) 03/23/2012  . Pulmonary hypertension 03/23/2012  . Cor pulmonale 03/21/2012  . Chronic respiratory failure with hypercapnia 03/21/2012  . OSA on BiPAP 03/19/2012  . Axillary lymphadenopathy 03/06/2012  . Congenital heart disease 01/28/2012  . Obesity 01/28/2012  . Dental caries 01/28/2012  . COPD (chronic obstructive pulmonary disease) 01/26/2012     Consultants None  Procedures Laparoscopic Appendectomy  Hospital Course 37 yr old female who presented to [facility] with abdominal pain.  Workup showed appendicitis.  Patient was admitted and underwent procedure listed above.  Tolerated procedure well and was transferred to the floor.  Diet was advanced as tolerated.  The patient did have multiple other medical problems therefore she was placed on the cardiac floor for monitoring.  On POD#1, the patient was voiding well, tolerating diet, ambulating well, pain well controlled, vital signs stable, incisions c/d/i and felt stable for discharge home.  Patient will follow up in our office in 2 weeks and knows to call with questions or concerns.  Physical Exam At Discharge: VSS afebrile Heart: RRR Lungs: decreased BS bilateral Abd: mildly distended,  +bs, incision c/d/i, expected tenderness Ext: mild edema    Medication List    TAKE these medications       albuterol 108 (90 BASE) MCG/ACT inhaler  Commonly known as:  VENTOLIN HFA  Inhale 2 puffs into the lungs every 6 (six) hours as needed for wheezing.     Fluticasone-Salmeterol 250-50 MCG/DOSE Aepb  Commonly known as:  ADVAIR  Inhale 1 puff into the lungs every 12 (twelve) hours.     oxyCODONE 5 MG immediate release tablet  Commonly known as:  Oxy IR/ROXICODONE  Take 1-2 tablets (5-10 mg total) by mouth every 3 (three) hours as needed.     tiotropium 18 MCG inhalation capsule  Commonly known as:  SPIRIVA  Place 1 capsule (18 mcg total) into inhaler and inhale daily.             Follow-up Information   Follow up with [facility],MD, MD In 2 weeks. (our office will contact her with her appt day and time)    Contact information:   [address] [place] [address] [place] [address] [phone]       Signed: Clance Boll, [facility] [phone]  05/04/2013, 8:35 AM

## 2013-05-05 NOTE — ED Provider Notes (Signed)
Medical screening examination/treatment/procedure(s) were performed by resident physician or non-physician practitioner and as supervising physician I was immediately available for consultation/collaboration.   Barkley Bruns MD.   Linna Hoff, MD 05/05/13 1059

## 2013-05-22 ENCOUNTER — Encounter (INDEPENDENT_AMBULATORY_CARE_PROVIDER_SITE_OTHER): Payer: 59

## 2013-05-25 ENCOUNTER — Telehealth: Payer: Self-pay | Admitting: Internal Medicine

## 2013-05-25 DIAGNOSIS — G4733 Obstructive sleep apnea (adult) (pediatric): Secondary | ICD-10-CM

## 2013-05-25 NOTE — Telephone Encounter (Signed)
Pt needing new mask order today d/t dog chewing up her current CPAP mask. Gave verbal okay : LIFETIME PAP MASK AND SUPPLIES FOR PT TO BE COVERED FOR DAMAGED SUPPLIES.  Spoken with Melissa and given verbal okay @ 3:18p 6/20   Will send as FYI to Dr Marchelle Gearing.

## 2013-05-26 NOTE — Telephone Encounter (Signed)
[place]ahn[place], noted  Dr. Kalman Shan, M.D., [facility].C.P Pulmonary and Critical Care Medicine Staff Physician [facility]monary [facility] Critical Care Pager: [phone], If no answer or between  15:00h - 7:00h: call [phone]  05/26/2013 4:18 PM

## 2013-06-04 ENCOUNTER — Ambulatory Visit (INDEPENDENT_AMBULATORY_CARE_PROVIDER_SITE_OTHER): Payer: 59 | Admitting: Internal Medicine

## 2013-06-04 ENCOUNTER — Encounter: Payer: Self-pay | Admitting: Internal Medicine

## 2013-06-04 VITALS — BP 108/72 | HR 111 | Ht 62.0 in | Wt 180.2 lb

## 2013-06-04 DIAGNOSIS — J449 Chronic obstructive pulmonary disease, unspecified: Secondary | ICD-10-CM

## 2013-06-04 NOTE — Patient Instructions (Addendum)
#  COPD  - - continue oxygen   - glad you puchased  a finger pulse oximeter and use it when needed; make sure pulse ox is > 88% all the time  - continue advair 100/50 or two times daily - samples given  - continue spiriva 1 puff daily -samples given   #Pulmonary Hypertension    - per Dr Teressa Lower; will defer to him at followup  #FOllowup  - 2 months or sooner if needed - CAT score and spirometry at fu - if falling sick or unwell let me know

## 2013-06-04 NOTE — Progress Notes (Signed)
Subjective:    Patient ID: Mary [name], female    DOB: 1976-12-03, 37 y.o.   MRN: [id number]  HPI #Obesity  - Body mass index is 32.07 kg/(m^2). on Nov 2013 - Body mass index is 32.95 kg/(m^2). on 06/04/2013    #Ex-Smoker  - age 62 to 57; 25 pack year smoking hx  # Hx of Congenital Heart Surgery NOS   - parents deceased. No idea what surgery she had  #COPD - Alpha 1 is MM - checked May 2013. per hx her dad had copd.  - PFTS 03/08/12: fev1 0.58L/1%, FVC 1.18/33%, Ratop 49 and c/w ssevere obstruction. 21% BD response on FVC, RV 219%, DLCO 11/54% - PFT 04/10/12: fev1 0.54L/18%, 31% BD response, RATio 49, TL 88%, RV 204$, DLCO 30%,  - Abg baseline MAy 2013: 7.43/50/81 - Rx ? On nocturnal cpap/bipap  #Pulmonary Hypertension/Cor Pulmonale - -  - CT chest 03/06/12  IMPRESSION:  Thrombosed right subclavian vein.  Marked right chest wall edema involving the right breast and right  axilla. Right axillary adenopathy.  No pulmonary embolus (probable motion artifact involving the right  lower lobe pulmonary vessels).  Mediastinal and right hilar adenopathy.  Scattered pulmonary parenchymal changes without dominant worrisome  mass. Patchy parenchymal changes most notable right upper lobe.  This can be reevaluated follow-up after acute episode has cleared.  Small right-sided pleural effusion. Right heart enlargement. Small  superiorly located pericardial effusion.  Ascites. Slight lobularity liver without other findings of  cirrhosis. Mild third spacing of fluid abdominal wall.  Original Report Authenticated By: Fuller Canada, M.   ECHO 03/06/12:  - Left ventricle: Systolic function was normal. The estimated ejection fraction was in the range of 55% to 60%. - Ventricular septum: Septal motion showed paradox. - Right ventricle: The cavity size was moderately dilated. Systolic function was reduced. - Right atrium: The atrium was moderately dilated   ECHO 5/813 - Normal LV but RV  severely dilated  Right Heart cath 03/07/12  Hemodynamic Findings:  Ao: (cuff) 111/84  LV: not performed  RA: 23  RV: 59/12/25  PA: 64/45 (mean 53)  PCWP: 18  Fick Cardiac Output: 5.0 L/min  Fick Cardiac Index: 2.76 L/min/m2  Peripheral Aortic Saturation: 90%  Pulmonary Artery Saturation: 67%    HIV 03/22/12   - NEGATIVE  AUTOIMMUNE  - alpha 1 03/07/12 - (done during illness) - 342  - ANA, RF, scl 70 - April 2013 - NEGATIVE   VQ scan 04/12/12  - low prob for PE  [place] Abd 04/07/12  - normal with spleen at upper limit of normal at 12cm      # RUE DVT  - Duple UE 03/06/12: acute deep vein thrombosis involving the right upper extremity of the right subclaviian vein. Rx  Coumadin stopped July 2013 by a Dr Michaell Cowing per her hx   #AECOPD  - several admits early 2013 including icu - May 2013  -opd RX - Oct 2013 - opd Rx  # ? Sleep Apnea  - SLEEP STUDY 03/29/12  Dr Maple Hudson   IMPRESSIONS-RECOMMENDATION:  1. Sleep architecture shows predominance of stage II sleep interrupted  mostly by rapid eye movement in a pattern that suggests sedating  medication during the sleep period, although the none is listed.  2. Occasional respiratory events with sleep disturbance, within normal  limits. Apnea-hypopnea index 4.6 per hour (the normal range for  adults is from 0-5 events per hour). Moderate snoring with oxygen  desaturation to a nadir of  73% and mean oxygen saturation through  the study of 93% wearing supplemental oxygen.  3. The patient wore nasal oxygen at 4 L per minute throughout the  study as ordered. On arrival while upright, saturation was 97%.  During this study, she desaturated to a nadir of 73% with a mean  throughout the study of 93% saturation while wearing oxygen at 4 L  per minute by nasal prongs. During the total recording time,  oxygen saturation was less than 90% for 51.3 minutes and less than  88% for 38.9 minutes while wearing supplemental oxygen.     #Difficult Social  Situation  - Oct 2013:  husband in and out of MD visits and hospitalizations: diagnosed as mass in testicular wall and polyps; all bengin but mass effect. But now back to work and lot better. Financial situation - better. Says she has gas $ to make it to doc appointments. Unemployed after copd diagnosis. Unable to get disability due to husband job (he earns too much for SSI,). She is not able to get SSD due to lack of work credits    OV 10/20/2012 FU for above. In terms of COPD: she is now reliant on samples and is feeling better. She has another 1-2 months of samples left. Rehab is yet to call her. Yet to see Dr Dominica Severin; has appt. Only issue is fatigue which is at level 5 on CAT score. Oither markers have improved on CAT score and in fact with mdi overall CAT score is down from 26 to 22. IN terms of transplant, I had asked for compliance and attendance at rehab. Thought she is yet to start rehab she is showing compliance with samples and moreover she says she has spoken to her church who can do fundraiser for her if transplant needed. She is willing to meet with [facility] trans[facility]nat to get a feel for the process. Smoking still in remission   OV 06/04/2013  Fu copd  Overl well. Insprin 2014 repirtedly had swine flu and then inmay 2014 had acute appy. Now well. Taking spiriva and dulera.. Needing tomanage on samples for LAMA/ICS/LABA but she always manages to get something and manages withsome sample. Currently copd cat score is 12 and best ever. Compliant with 02. Doing self-rehab at home; only 1 car. Unable to go to [facility] for transplant eval due to 1 car. Otherwise well without problems. Of note, says PMD not yet evaluated her for osteoporosis  CAT COPD Symptom & Quality of Life Score (GSK trademark) 0 is no burden. 5 is highest burden 09/21/2012  10/20/2012  06/04/2013   Never Cough -> Cough all the time 4 2 2   No phlegm in chest -> Chest is full of phlegm 4 3 0  No chest tightness -> Chest  feels very tight 2 2 1   No dyspnea for 1 flight stairs/hill -> Very dyspneic for 1 flight of stairs 5 5 5   No limitations for ADL at home -> Very limited with ADL at home 3 3 2   Confident leaving home -> Not at all confident leaving home 2 0 0  Sleep soundly -> Do not sleep soundly because of lung condition 3 2 0  Lots of Energy -> No energy at all 3 5 2   TOTAL Score (max 40)  26 22 12    Current outpatient prescriptions:albuterol (VENTOLIN HFA) 108 (90 BASE) MCG/ACT inhaler, Inhale 2 puffs into the lungs every 6 (six) hours as needed for wheezing., Disp: 18 each, Rfl: 0;  Fluticasone-Salmeterol (ADVAIR) 250-50 MCG/DOSE AEPB, Inhale 1 puff into the lungs every 12 (twelve) hours., Disp: 60 each, Rfl: 0 tiotropium (SPIRIVA) 18 MCG inhalation capsule, Place 1 capsule (18 mcg total) into inhaler and inhale daily., Disp: 30 capsule, Rfl: 5  Past, Family, Social reviewed: admitted acute appy end may 2014   Review of Systems  Constitutional: Negative for fever and unexpected weight change.  HENT: Negative for ear pain, nosebleeds, congestion, sore throat, rhinorrhea, sneezing, trouble swallowing, dental problem, postnasal drip and sinus pressure.   Eyes: Negative for redness and itching.  Respiratory: Negative for cough, chest tightness, shortness of breath and wheezing.   Cardiovascular: Negative for palpitations and leg swelling.  Gastrointestinal: Negative for nausea and vomiting.  Genitourinary: Negative for dysuria.  Musculoskeletal: Negative for joint swelling.  Skin: Negative for rash.  Neurological: Negative for headaches.  Hematological: Does not bruise/bleed easily.  Psychiatric/Behavioral: Negative for dysphoric mood. The patient is not nervous/anxious.        Objective:   Physical Exam  Physical Exam Vitals reviewed. Constitutional: She is oriented to person, place, and time. She appears well-developed and well-nourished. No distress.        Estimated Body mass index is 32.07  kg/(m^2) as calculated from the following:   Height as of this encounter: 5' 2.5"(1.588 m).   Weight as of this encounter: 178 lb 3.2 oz(80.831 kg).    HENT:  Head: Normocephalic and atraumatic.  Right Ear: External ear normal.  Left Ear: External ear normal.  Mouth/Throat: Oropharynx is clear and moist. No oropharyngeal exudate.       Nasal cannula + Caries +  Eyes: Conjunctivae normal and EOM are normal. Pupils are equal, round, and reactive to light. Right eye exhibits no discharge. Left eye exhibits no discharge. No scleral icterus.  Neck: Normal range of motion. Neck supple. No JVD present. No tracheal deviation present. No thyromegaly present.  Cardiovascular: Normal rate, regular rhythm, normal heart sounds and intact distal pulses.  Exam reveals no gallop and no friction rub.   No murmur heard. Pulmonary/Chest: Effort normal and breath sounds normal. No respiratory distress. She has no wheezes. She has no rales. She exhibits no tenderness.       Overall diminshed air entry  Abdominal: Soft. Bowel sounds are normal. She exhibits no distension and no mass. There is no tenderness. There is no rebound and no guarding.  Musculoskeletal: Normal range of motion. She exhibits no edema and no tenderness.  Lymphadenopathy:    She has no cervical adenopathy.  Neurological: She is alert and oriented to person, place, and time. She has normal reflexes. No cranial nerve deficit. She exhibits normal muscle tone. Coordination normal.  Skin: Skin is warm and dry. No rash noted. She is not diaphoretic. No erythema. No pallor.  Psychiatric: She has a normal mood and affect. Her behavior is normal. Judgment and thought content normal.  cheerful      Assessment & Plan:

## 2013-07-19 ENCOUNTER — Encounter (HOSPITAL_COMMUNITY): Payer: Self-pay | Admitting: *Deleted

## 2013-07-19 ENCOUNTER — Emergency Department (HOSPITAL_COMMUNITY): Payer: 59

## 2013-07-19 ENCOUNTER — Emergency Department (HOSPITAL_COMMUNITY)
Admission: EM | Admit: 2013-07-19 | Discharge: 2013-07-20 | Disposition: A | Discharge status: Home / Self Care | Payer: 59 | Attending: Emergency Medicine | Admitting: Emergency Medicine

## 2013-07-19 DIAGNOSIS — J441 Chronic obstructive pulmonary disease with (acute) exacerbation: Secondary | ICD-10-CM | POA: Insufficient documentation

## 2013-07-19 DIAGNOSIS — Z8742 Personal history of other diseases of the female genital tract: Secondary | ICD-10-CM | POA: Insufficient documentation

## 2013-07-19 DIAGNOSIS — Z8719 Personal history of other diseases of the digestive system: Secondary | ICD-10-CM | POA: Insufficient documentation

## 2013-07-19 DIAGNOSIS — T7840XA Allergy, unspecified, initial encounter: Secondary | ICD-10-CM

## 2013-07-19 DIAGNOSIS — Z8701 Personal history of pneumonia (recurrent): Secondary | ICD-10-CM | POA: Insufficient documentation

## 2013-07-19 DIAGNOSIS — I509 Heart failure, unspecified: Secondary | ICD-10-CM | POA: Insufficient documentation

## 2013-07-19 DIAGNOSIS — Z86718 Personal history of other venous thrombosis and embolism: Secondary | ICD-10-CM | POA: Insufficient documentation

## 2013-07-19 DIAGNOSIS — G4733 Obstructive sleep apnea (adult) (pediatric): Secondary | ICD-10-CM | POA: Insufficient documentation

## 2013-07-19 DIAGNOSIS — Z86711 Personal history of pulmonary embolism: Secondary | ICD-10-CM | POA: Insufficient documentation

## 2013-07-19 DIAGNOSIS — Z87891 Personal history of nicotine dependence: Secondary | ICD-10-CM | POA: Insufficient documentation

## 2013-07-19 DIAGNOSIS — Z79899 Other long term (current) drug therapy: Secondary | ICD-10-CM | POA: Insufficient documentation

## 2013-07-19 DIAGNOSIS — Z8679 Personal history of other diseases of the circulatory system: Secondary | ICD-10-CM | POA: Insufficient documentation

## 2013-07-19 DIAGNOSIS — R21 Rash and other nonspecific skin eruption: Secondary | ICD-10-CM | POA: Insufficient documentation

## 2013-07-19 DIAGNOSIS — R112 Nausea with vomiting, unspecified: Secondary | ICD-10-CM | POA: Insufficient documentation

## 2013-07-19 MED ORDER — FAMOTIDINE IN NACL 20-0.9 MG/50ML-% IV SOLN
20.0000 mg | Freq: Once | INTRAVENOUS | Status: AC
  Administered 2013-07-19: 20 mg via INTRAVENOUS
  Filled 2013-07-19: qty 50

## 2013-07-19 MED ORDER — DEXAMETHASONE SODIUM PHOSPHATE 10 MG/ML IJ SOLN
10.0000 mg | Freq: Once | INTRAMUSCULAR | Status: AC
  Administered 2013-07-19: 10 mg via INTRAVENOUS
  Filled 2013-07-19: qty 1

## 2013-07-19 MED ORDER — DIPHENHYDRAMINE HCL 50 MG/ML IJ SOLN
50.0000 mg | Freq: Once | INTRAMUSCULAR | Status: AC
  Administered 2013-07-19: 50 mg via INTRAVENOUS
  Filled 2013-07-19: qty 1

## 2013-07-19 NOTE — ED Notes (Signed)
Per EMS: pt was eating fast food this afternoon and fast food this evening. Pt states that she got home and having started having SOB once her husband got home. Pt states that she is also having abdominal pain, N/V/D. Pt normally on 3L of O2. Pt given 5mg  of albuterol and and NRB. Pt stats increased and pt's breathing got better. Able to talk in complete sentences.

## 2013-07-19 NOTE — ED Provider Notes (Signed)
CSN: [id number]     Arrival date & time 07/19/13  2054 History     First MD Initiated Contact with Patient 07/19/13 2103     Chief Complaint  Patient presents with  . Shortness of Breath   (Consider location/radiation/quality/duration/timing/severity/associated sxs/prior Treatment) Patient is a 37 y.o. female presenting with shortness of breath. The history is provided by the patient and a friend.  Shortness of Breath Severity:  Moderate Onset quality:  Sudden Duration:  30 minutes Timing:  Constant Progression:  Resolved Chronicity:  New Relieved by:  Rest Worsened by:  Nothing tried Ineffective treatments:  None tried Associated symptoms: rash, vomiting and wheezing   Associated symptoms: no abdominal pain, no chest pain, no cough, no diaphoresis, no ear pain, no fever, no headaches, no neck pain and no sore throat     Past Medical History  Diagnosis Date  . COPD (chronic obstructive pulmonary disease)     PFTS 03/08/12: fev1 0.58L/1%, FVC 1.18/33%, Ratop 49 and c/w ssevere obstruction. 21% BD response on FVC, RV 219%, DLCO 11/54%  . History of alcoholism   . Irregular menses   . DVT of upper extremity (deep vein thrombosis) April 2013    right subclavian // Unclear precipitating cause - possibly significant right heart failure, with vascular stasis potentially predisposing to clotting.    . Pulmonary embolus April 2013    Precipitating cause unclear. Was treated with coumadin from April-June 2013.  . Moderate to severe pulmonary hypertension April 2013    Cardiac cath on 03/06/12 - 1. Elevated pulmonary artery pressures, right sided filling pressures.,  2. PA: 64/45 (mean 53)    . Hepatic cirrhosis     Questionable history of - Noted on CT abdomen (03/2012) - thought to be due to vascular congestion from right heart failure +/- patient's history of alcohol abuse  . Axillary adenopathy     right axillary adenopathy noted on CT chest (03/06/2012)  . Periodontal disease   .  Chronic right-sided CHF (congestive heart failure) 03/23/2012    with cor pulmonale. Last RHC 03/2012  . Pneumonia ~ 1985; 01/2011  . History of chronic bronchitis   . Exertional shortness of breath   . On home oxygen therapy     "2-3 L 24/7" (05/03/2013)  . OSA (obstructive sleep apnea)     wears noctural BiPAP (05/03/2013)  . Migraine     "~ 1/yr" (05/03/2013)   Past Surgical History  Procedure Laterality Date  . Cardiac surgery  10/14/1977    "my heart was backwards" (05/03/2013)  . Lung surgery  05-Jul-1977  . Appendectomy  05/03/2013  . Cardiac catheterization  03/2012  . Laparoscopic appendectomy N/A 05/03/2013    Procedure: APPENDECTOMY LAPAROSCOPIC;  Surgeon: Cherylynn Ridges, MD;  Location: [facility] OR;  Service: General;  Laterality: N/A;   Family History  Problem Relation Age of Onset  . Hypertension Father   . Heart disease Father     CHF; died age 60   . Alcohol abuse Father   . Other Brother     died age 71 y.o overdose   History  Substance Use Topics  . Smoking status: Former Smoker -- 1.00 packs/day for 25 years    Types: Cigarettes    Quit date: 02/18/2012  . Smokeless tobacco: Never Used  . Alcohol Use: No     Comment: previously drinking 1 pint 3-4 days a week for 2002-2013, but in 2014, no longer drinking   OB History   Grav Para  Term Preterm Abortions TAB SAB Ect Mult Living                 Review of Systems  Constitutional: Negative for fever, chills, diaphoresis and fatigue.  HENT: Negative for ear pain, congestion, sore throat, facial swelling, mouth sores, trouble swallowing, neck pain and neck stiffness.   Eyes: Negative.   Respiratory: Positive for shortness of breath and wheezing. Negative for apnea, cough and chest tightness.   Cardiovascular: Negative for chest pain, palpitations and leg swelling.  Gastrointestinal: Positive for nausea and vomiting. Negative for abdominal pain, diarrhea and abdominal distention.  Genitourinary: Negative for hematuria,  flank pain, vaginal discharge, difficulty urinating and menstrual problem.  Musculoskeletal: Negative for back pain and gait problem.  Skin: Positive for rash. Negative for wound.  Neurological: Negative for dizziness, tremors, seizures, syncope, facial asymmetry, numbness and headaches.  Psychiatric/Behavioral: Negative.   All other systems reviewed and are negative.    Allergies  Review of patient's allergies indicates no known allergies.  Home Medications   Current Outpatient Rx  Name  Route  Sig  Dispense  Refill  . albuterol (VENTOLIN HFA) 108 (90 BASE) MCG/ACT inhaler   Inhalation   Inhale 2 puffs into the lungs every 6 (six) hours as needed for wheezing.   18 each   0     Pt needs appt for more refills   . Fluticasone-Salmeterol (ADVAIR) 250-50 MCG/DOSE AEPB   Inhalation   Inhale 1 puff into the lungs every 12 (twelve) hours.   60 each   0   . HYDROcodone-acetaminophen (NORCO/VICODIN) 5-325 MG per tablet   Oral   Take 1 tablet by mouth every 6 (six) hours as needed for pain.         Marland Kitchen tiotropium (SPIRIVA) 18 MCG inhalation capsule   Inhalation   Place 1 capsule (18 mcg total) into inhaler and inhale daily.   30 capsule   5    BP 118/68  Pulse 92  Temp(Src) 97.5 F (36.4 C) (Oral)  Resp 16  SpO2 93%  LMP 07/03/2013 Physical Exam  Nursing note and vitals reviewed. Constitutional: She is oriented to person, place, and time. She appears well-developed and well-nourished. No distress.  HENT:  Head: Normocephalic and atraumatic.  Right Ear: External ear normal.  Left Ear: External ear normal.  Nose: Nose normal.  Mouth/Throat: Oropharynx is clear and moist. No oropharyngeal exudate.  Eyes: Conjunctivae and EOM are normal. Pupils are equal, round, and reactive to light. Right eye exhibits no discharge. Left eye exhibits no discharge.  Neck: Normal range of motion. Neck supple. No JVD present. No tracheal deviation present. No thyromegaly present.   Cardiovascular: Regular rhythm, normal heart sounds and intact distal pulses.  Tachycardia present.  Exam reveals no gallop and no friction rub.   No murmur heard. Pulmonary/Chest: Effort normal. No respiratory distress. She has decreased breath sounds. She has wheezes. She has no rales. She exhibits no tenderness.  Mild decreased breath sounds and some mild wheezing  Abdominal: Soft. Bowel sounds are normal. She exhibits no distension. There is no tenderness. There is no rebound and no guarding.  Musculoskeletal: Normal range of motion.  Lymphadenopathy:    She has no cervical adenopathy.  Neurological: She is alert and oriented to person, place, and time. No cranial nerve deficit. Coordination normal.  Skin: Skin is warm. No rash noted. She is not diaphoretic.  Psychiatric: She has a normal mood and affect. Her behavior is normal. Judgment and thought  content normal.    ED Course   Procedures (including critical care time)  Labs Reviewed - No data to display Dg Chest 2 View  07/19/2013   *RADIOLOGY REPORT*  Clinical Data: Shortness of breath  CHEST - 2 VIEW  Comparison: 02/28/2013 CT and chest radiograph  Findings: Cardiomediastinal silhouette is within normal limits. The lungs are clear. No pleural effusion.  No pneumothorax.  No acute osseous abnormality.  IMPRESSION: Normal chest.   Original Report Authenticated By: Christiana Pellant, M.D.   1. Allergic reaction, initial encounter     MDM  37 yr old F pt here with shortness of breath, a sensation of throat swelling, what she describes as "hives" and some nausea and vomiting. It sounds like patient may have had an allergic reaction to something but the patient is not able to pinpoint a food or other environmental exposure that may have started the reaction. Here patient is sating well on 2 L of oxygen which is what she wears at home. Patient now symptoms free. Patient with no rash on my exam, no stridor and patient no longer felt short of  breath. Gave patient allergy medicines and observed for 4 hours. Patient continues to be symptoms free. Will recommend that she try to see an allergist but is safe for discharge at this point in time.   Date: 07/19/2013  Rate: 106  Rhythm: sinus tachycardia  QRS Axis: normal  Intervals: normal  ST/T Wave abnormalities: normal  Conduction Disutrbances:none  Narrative Interpretation:   Old EKG Reviewed: unchanged    Case discussed with Dr. Catarina Hartshorn, MD 07/19/13 [phone]

## 2013-07-19 NOTE — ED Notes (Signed)
Pt concerned about a rash on her arm. Pt states that she has been itching and redness to her arm.

## 2013-07-20 NOTE — ED Provider Notes (Signed)
Medical screening examination/treatment/procedure(s) were conducted as a shared visit with resident-physician practitioner(s) and myself.  I personally evaluated the patient during the encounter.  Pt is a 37 y.o. female with pmhx as above presenting with likely allergic reaction to unknown cause, but does not have anaphylaxis and symptoms improved prior to arrival.  Lungs clear on home O2, rash resolved, abdominal exam benign.   Shanna Cisco, MD 07/20/13 1031

## 2013-07-25 ENCOUNTER — Other Ambulatory Visit: Payer: Self-pay | Admitting: Internal Medicine

## 2013-08-23 ENCOUNTER — Encounter (HOSPITAL_COMMUNITY): Payer: Self-pay | Admitting: *Deleted

## 2013-08-23 ENCOUNTER — Emergency Department (INDEPENDENT_AMBULATORY_CARE_PROVIDER_SITE_OTHER)
Admission: EM | Admit: 2013-08-23 | Discharge: 2013-08-23 | Disposition: A | Discharge status: Home / Self Care | Payer: 59 | Source: Home / Self Care | Attending: Emergency Medicine | Admitting: Emergency Medicine

## 2013-08-23 DIAGNOSIS — IMO0002 Reserved for concepts with insufficient information to code with codable children: Secondary | ICD-10-CM

## 2013-08-23 MED ORDER — OXYCODONE-ACETAMINOPHEN 5-325 MG PO TABS: ORAL_TABLET | ORAL | Status: DC

## 2013-08-23 MED ORDER — CEPHALEXIN 500 MG PO CAPS: 500.0000 mg | ORAL_CAPSULE | Freq: Three times a day (TID) | ORAL | Status: DC
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
## 2013-08-26 ENCOUNTER — Emergency Department (INDEPENDENT_AMBULATORY_CARE_PROVIDER_SITE_OTHER)
Admission: EM | Admit: 2013-08-26 | Discharge: 2013-08-26 | Disposition: A | Discharge status: Home / Self Care | Payer: 59 | Source: Home / Self Care

## 2013-08-26 ENCOUNTER — Encounter (HOSPITAL_COMMUNITY): Payer: Self-pay | Admitting: Emergency Medicine

## 2013-08-26 DIAGNOSIS — T3111 Burns involving 10-19% of body surface with 10-19% third degree burns: Secondary | ICD-10-CM

## 2013-08-26 DIAGNOSIS — T3110 Burns involving 10-19% of body surface with 0% to 9% third degree burns: Secondary | ICD-10-CM

## 2013-08-26 MED ORDER — HYDROCODONE-ACETAMINOPHEN 5-325 MG PO TABS: 2.0000 | ORAL_TABLET | ORAL | Status: DC | PRN
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
## 2013-08-28 NOTE — ED Provider Notes (Signed)
Medical screening examination/treatment/procedure(s) were performed by non-physician practitioner and as supervising physician I was immediately available for consultation/collaboration.   [facility]; MD  Sharin Grave, MD 08/28/13 [phone]

## 2013-09-04 ENCOUNTER — Other Ambulatory Visit: Payer: Self-pay | Admitting: Internal Medicine

## 2013-09-06 ENCOUNTER — Emergency Department (INDEPENDENT_AMBULATORY_CARE_PROVIDER_SITE_OTHER)
Admission: EM | Admit: 2013-09-06 | Discharge: 2013-09-06 | Disposition: A | Discharge status: Home / Self Care | Payer: 59 | Source: Home / Self Care | Attending: Family Medicine | Admitting: Family Medicine

## 2013-09-06 ENCOUNTER — Encounter (HOSPITAL_COMMUNITY): Payer: Self-pay | Admitting: Emergency Medicine

## 2013-09-06 DIAGNOSIS — S61511A Laceration without foreign body of right wrist, initial encounter: Secondary | ICD-10-CM

## 2013-09-06 DIAGNOSIS — Z23 Encounter for immunization: Secondary | ICD-10-CM

## 2013-09-06 DIAGNOSIS — S61509A Unspecified open wound of unspecified wrist, initial encounter: Secondary | ICD-10-CM

## 2013-09-06 MED ORDER — TETANUS-DIPHTH-ACELL PERTUSSIS 5-2.5-18.5 LF-MCG/0.5 IM SUSP
INTRAMUSCULAR | Status: AC
  Filled 2013-09-06: qty 0.5

## 2013-09-06 MED ORDER — TETANUS-DIPHTH-ACELL PERTUSSIS 5-2.5-18.5 LF-MCG/0.5 IM SUSP
0.5000 mL | Freq: Once | INTRAMUSCULAR | Status: AC
  Administered 2013-09-06: 0.5 mL via INTRAMUSCULAR

## 2013-09-06 MED ORDER — DOXYCYCLINE HYCLATE 100 MG PO CAPS: 100.0000 mg | ORAL_CAPSULE | Freq: Two times a day (BID) | ORAL | Status: DC
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
## 2013-09-18 ENCOUNTER — Ambulatory Visit (INDEPENDENT_AMBULATORY_CARE_PROVIDER_SITE_OTHER): Payer: 59 | Admitting: Pulmonary Disease

## 2013-09-18 ENCOUNTER — Ambulatory Visit (INDEPENDENT_AMBULATORY_CARE_PROVIDER_SITE_OTHER)
Admission: RE | Admit: 2013-09-18 | Discharge: 2013-09-18 | Disposition: A | Discharge status: Home / Self Care | Payer: 59 | Source: Ambulatory Visit | Attending: Pulmonary Disease | Admitting: Pulmonary Disease

## 2013-09-18 ENCOUNTER — Encounter: Payer: Self-pay | Admitting: Pulmonary Disease

## 2013-09-18 ENCOUNTER — Other Ambulatory Visit (INDEPENDENT_AMBULATORY_CARE_PROVIDER_SITE_OTHER): Payer: 59

## 2013-09-18 VITALS — BP 118/60 | HR 119 | Temp 98.0°F | Ht 63.0 in | Wt 184.2 lb

## 2013-09-18 DIAGNOSIS — R0600 Dyspnea, unspecified: Secondary | ICD-10-CM

## 2013-09-18 DIAGNOSIS — R0609 Other forms of dyspnea: Secondary | ICD-10-CM

## 2013-09-18 LAB — CBC WITH DIFFERENTIAL/PLATELET
Basophils Absolute: 0 10*3/uL (ref 0.0–0.1)
Basophils Relative: 0.1 % (ref 0.0–3.0)
Eosinophils Absolute: 0.1 10*3/uL (ref 0.0–0.7)
HCT: 42.9 % (ref 36.0–46.0)
Hemoglobin: 14.6 g/dL (ref 12.0–15.0)
Lymphs Abs: 1.3 10*3/uL (ref 0.7–4.0)
MCHC: 34 g/dL (ref 30.0–36.0)
MCV: 93.6 fl (ref 78.0–100.0)
Monocytes Absolute: 1.2 10*3/uL — ABNORMAL HIGH (ref 0.1–1.0)
Neutro Abs: 9.3 10*3/uL — ABNORMAL HIGH (ref 1.4–7.7)
RBC: 4.58 Mil/uL (ref 3.87–5.11)
RDW: 14.3 % (ref 11.5–14.6)

## 2013-09-18 LAB — COMPREHENSIVE METABOLIC PANEL
ALT: 38 U/L — ABNORMAL HIGH (ref 0–35)
AST: 33 U/L (ref 0–37)
Alkaline Phosphatase: 74 U/L (ref 39–117)
BUN: 15 mg/dL (ref 6–23)
Calcium: 9.3 mg/dL (ref 8.4–10.5)
Chloride: 97 mEq/L (ref 96–112)
Creatinine, Ser: 0.8 mg/dL (ref 0.4–1.2)
Total Bilirubin: 1 mg/dL (ref 0.3–1.2)

## 2013-09-18 MED ORDER — PREDNISONE 10 MG PO TABS: ORAL_TABLET | ORAL | Status: DC

## 2013-09-18 NOTE — Assessment & Plan Note (Signed)
She reports progressive dypsnea with pleuritic chest pain.  I am not certain what is causing her symptoms.  Her clinical exam, chest xray, and ECG are unrevealing.  To further assess will arrange for lab testing with CBC, CMET, and D dimer.  Will also arrange for CT angiogram to r/o pulmonary embolism.    If these test results are unrevealing, then may try course of prednisone for pleuritic chest pain >> advised her not to start prednisone until she hears back from [place] about test results.

## 2013-09-18 NOTE — Patient Instructions (Signed)
Lab tests today Will schedule CT chest Will call with results of tests >> do not start prednisone until after you hear back about test results Follow up with Dr. Marchelle Gearing or [name] in one week

## 2013-09-18 NOTE — Progress Notes (Signed)
Chief Complaint  Patient presents with  . Acute office visit    Pain in chest with  inhaling and bending over for past 2 days - SOB is worse - Occas non prod cough    History of Present Illness: Mary [name] is a 37 y.o. female former smoker with COPD and chronic hypoxic respiratory failure.  She is followed by Dr. Marchelle Gearing.  She is here for an acute visit.  She started feeling bad about 4 days ago.  It has been hurting when she takes a breath.  This happens in her mid upper chest.  She also has chest discomfort when she bends over.  She has occasional cough with clear sputum.  She is not having wheeze or chest tightness.  She denies sinus congestion, ear pain, or sore throat.  She has not had abdominal pain, diarrhea, or leg swelling.  She denies sick exposures.  She just feels "yucky".  She says her husband was removing insulation about a week ago, and she isn't sure if this exposure caused her symptoms.  She has been using ventolin more frequently >> this helps her breathing, but not her pain.   Mary [name]  has a past medical history of COPD (chronic obstructive pulmonary disease); History of alcoholism; Irregular menses; DVT of upper extremity (deep vein thrombosis) (April 2013); Pulmonary embolus (April 2013); Moderate to severe pulmonary hypertension (April 2013); Hepatic cirrhosis; Axillary adenopathy; Periodontal disease; Chronic right-sided CHF (congestive heart failure) (03/23/2012); Pneumonia (~ 1985; 01/2011); History of chronic bronchitis; Exertional shortness of breath; On home oxygen therapy; OSA (obstructive sleep apnea); and Migraine.  Mary [name]  has past surgical history that includes Cardiac surgery (May 01, 1977); Lung surgery (27-Jun-1977); Appendectomy (05/03/2013); Cardiac catheterization (03/2012); and laparoscopic appendectomy (N/A, 05/03/2013).  Prior to Admission medications   Medication Sig Start Date End Date Taking? Authorizing Provider  Fluticasone-Salmeterol  (ADVAIR) 250-50 MCG/DOSE AEPB Inhale 1 puff into the lungs every 12 (twelve) hours. 04/26/13  Yes Kalman Shan, MD  tiotropium (SPIRIVA) 18 MCG inhalation capsule Place 1 capsule (18 mcg total) into inhaler and inhale daily. 09/21/12  Yes Kalman Shan, MD  VENTOLIN HFA 108 (90 BASE) MCG/ACT inhaler USE 2 PUFFS EVERY 6 HOURS AS NEEDED FOR WHEEZING 09/04/13  Yes Kalman Shan, MD    No Known Allergies   Physical Exam:  General - No distress, wearing oxygen ENT - No sinus tenderness, no oral exudate, no LAN Cardiac - s1s2 regular, no murmur Chest - Decreased breath sounds, faint rales Lt base, no wheeze, no pain with palpation Back - No focal tenderness Abd - Soft, non-tender Ext - No edema Neuro - Normal strength Skin - No rashes Psych - normal mood, and behavior  Dg Chest 2 View  09/18/2013   CLINICAL DATA:  Chest pain and cough  EXAM: CHEST  2 VIEW  COMPARISON:  July 19, 2013  FINDINGS: There is mild scarring in the left base. The lungs are otherwise clear. Heart size and pulmonary vascularity are normal. No adenopathy. No pneumothorax. No bone lesions.  IMPRESSION: No edema or consolidation.   Electronically Signed   By: Bretta Bang M.D.   On: 09/18/2013 14:41      Assessment/Plan:  Coralyn Helling, MD [facility] Pager:  [phone]

## 2013-09-19 ENCOUNTER — Telehealth: Payer: Self-pay | Admitting: Pulmonary Disease

## 2013-09-19 ENCOUNTER — Inpatient Hospital Stay: Admission: RE | Admit: 2013-09-19 | Payer: 59 | Source: Ambulatory Visit

## 2013-09-19 NOTE — Telephone Encounter (Signed)
CMP     Component Value Date/Time   NA 136 09/18/2013 1540   K 4.2 09/18/2013 1540   CL 97 09/18/2013 1540   CO2 31 09/18/2013 1540   GLUCOSE 101* 09/18/2013 1540   BUN 15 09/18/2013 1540   CREATININE 0.8 09/18/2013 1540   CALCIUM 9.3 09/18/2013 1540   PROT 7.5 09/18/2013 1540   ALBUMIN 4.0 09/18/2013 1540   AST 33 09/18/2013 1540   ALT 38* 09/18/2013 1540   ALKPHOS 74 09/18/2013 1540   BILITOT 1.0 09/18/2013 1540    CBC    Component Value Date/Time   WBC 12.0* 09/18/2013 1540   RBC 4.58 09/18/2013 1540   HGB 14.6 09/18/2013 1540   HCT 42.9 09/18/2013 1540   PLT 195.0 09/18/2013 1540   MCV 93.6 09/18/2013 1540   MCHC 34.0 09/18/2013 1540   RDW 14.3 09/18/2013 1540   LYMPHSABS 1.3 09/18/2013 1540   MONOABS 1.2* 09/18/2013 1540   EOSABS 0.1 09/18/2013 1540   BASOSABS 0.0 09/18/2013 1540    Lab Results  Component Value Date   DDIMER 0.36 09/18/2013    Results d/w pt.  She continues to have chest discomfort and had to sleep elevated on pillows last night to alleviate pain.  Advised her to start taking prednisone pending results of CT chest.  She requested to have her CT chest scheduled for today, 10/15, instead of 10/14.  This is scheduled for 3 pm on 10/15.  Will call her again once results are available.  Will route message to Dr. Marchelle Gearing to be aware of plan.

## 2013-09-21 ENCOUNTER — Other Ambulatory Visit: Payer: 59

## 2013-09-21 ENCOUNTER — Telehealth: Payer: Self-pay | Admitting: Pulmonary Disease

## 2013-09-21 NOTE — Telephone Encounter (Signed)
Will forward message to VS to make him aware

## 2013-09-21 NOTE — Telephone Encounter (Signed)
Noted.  Will route to Dr. Marchelle Gearing.

## 2013-09-23 NOTE — Telephone Encounter (Signed)
Mary [name]   She no showed for CT angio. Give her fu with TAmmy P  Thanks  Dr. Kalman Shan, M.D., [facility].C.P Pulmonary and Critical Care Medicine Staff Physician [facility] Pager: [phone], If no answer or between  15:00h - 7:00h: call [phone]  09/23/2013 10:31 PM

## 2013-09-25 ENCOUNTER — Ambulatory Visit: Payer: 59 | Admitting: Adult Health

## 2013-09-27 NOTE — Telephone Encounter (Signed)
appt was set for 09-25-13 pt no showed/cancelled this appt and did not want to r/s Mary [name], CMA

## 2013-12-27 ENCOUNTER — Encounter (HOSPITAL_COMMUNITY): Payer: Self-pay | Admitting: Emergency Medicine

## 2013-12-27 ENCOUNTER — Emergency Department (HOSPITAL_COMMUNITY)
Admission: EM | Admit: 2013-12-27 | Discharge: 2013-12-27 | Disposition: A | Discharge status: Home / Self Care | Payer: 59 | Attending: Emergency Medicine | Admitting: Emergency Medicine

## 2013-12-27 ENCOUNTER — Emergency Department (HOSPITAL_COMMUNITY): Payer: 59

## 2013-12-27 ENCOUNTER — Emergency Department (INDEPENDENT_AMBULATORY_CARE_PROVIDER_SITE_OTHER)
Admission: EM | Admit: 2013-12-27 | Discharge: 2013-12-27 | Disposition: A | Discharge status: Home / Self Care | Payer: 59 | Source: Home / Self Care | Attending: Emergency Medicine | Admitting: Emergency Medicine

## 2013-12-27 DIAGNOSIS — Z86711 Personal history of pulmonary embolism: Secondary | ICD-10-CM | POA: Insufficient documentation

## 2013-12-27 DIAGNOSIS — R0781 Pleurodynia: Secondary | ICD-10-CM

## 2013-12-27 DIAGNOSIS — G4733 Obstructive sleep apnea (adult) (pediatric): Secondary | ICD-10-CM | POA: Insufficient documentation

## 2013-12-27 DIAGNOSIS — J4489 Other specified chronic obstructive pulmonary disease: Secondary | ICD-10-CM | POA: Insufficient documentation

## 2013-12-27 DIAGNOSIS — Z8719 Personal history of other diseases of the digestive system: Secondary | ICD-10-CM | POA: Insufficient documentation

## 2013-12-27 DIAGNOSIS — IMO0002 Reserved for concepts with insufficient information to code with codable children: Secondary | ICD-10-CM | POA: Insufficient documentation

## 2013-12-27 DIAGNOSIS — Z87891 Personal history of nicotine dependence: Secondary | ICD-10-CM | POA: Insufficient documentation

## 2013-12-27 DIAGNOSIS — Z8742 Personal history of other diseases of the female genital tract: Secondary | ICD-10-CM | POA: Insufficient documentation

## 2013-12-27 DIAGNOSIS — R071 Chest pain on breathing: Secondary | ICD-10-CM | POA: Insufficient documentation

## 2013-12-27 DIAGNOSIS — F1021 Alcohol dependence, in remission: Secondary | ICD-10-CM | POA: Insufficient documentation

## 2013-12-27 DIAGNOSIS — J449 Chronic obstructive pulmonary disease, unspecified: Secondary | ICD-10-CM | POA: Insufficient documentation

## 2013-12-27 DIAGNOSIS — I509 Heart failure, unspecified: Secondary | ICD-10-CM | POA: Insufficient documentation

## 2013-12-27 DIAGNOSIS — Z9981 Dependence on supplemental oxygen: Secondary | ICD-10-CM | POA: Insufficient documentation

## 2013-12-27 DIAGNOSIS — Z86718 Personal history of other venous thrombosis and embolism: Secondary | ICD-10-CM | POA: Insufficient documentation

## 2013-12-27 DIAGNOSIS — Z8701 Personal history of pneumonia (recurrent): Secondary | ICD-10-CM | POA: Insufficient documentation

## 2013-12-27 DIAGNOSIS — Z95818 Presence of other cardiac implants and grafts: Secondary | ICD-10-CM | POA: Insufficient documentation

## 2013-12-27 DIAGNOSIS — I2789 Other specified pulmonary heart diseases: Secondary | ICD-10-CM | POA: Insufficient documentation

## 2013-12-27 DIAGNOSIS — Z3202 Encounter for pregnancy test, result negative: Secondary | ICD-10-CM | POA: Insufficient documentation

## 2013-12-27 DIAGNOSIS — Z79899 Other long term (current) drug therapy: Secondary | ICD-10-CM | POA: Insufficient documentation

## 2013-12-27 DIAGNOSIS — R0789 Other chest pain: Secondary | ICD-10-CM

## 2013-12-27 DIAGNOSIS — R109 Unspecified abdominal pain: Secondary | ICD-10-CM | POA: Insufficient documentation

## 2013-12-27 LAB — CBC
HEMATOCRIT: 46.8 % — AB (ref 36.0–46.0)
Hemoglobin: 15.6 g/dL — ABNORMAL HIGH (ref 12.0–15.0)
MCH: 32.1 pg (ref 26.0–34.0)
MCHC: 33.3 g/dL (ref 30.0–36.0)
MCV: 96.3 fL (ref 78.0–100.0)
Platelets: 182 10*3/uL (ref 150–400)
RBC: 4.86 MIL/uL (ref 3.87–5.11)
RDW: 13.2 % (ref 11.5–15.5)
WBC: 10.3 10*3/uL (ref 4.0–10.5)

## 2013-12-27 LAB — URINALYSIS, ROUTINE W REFLEX MICROSCOPIC
Bilirubin Urine: NEGATIVE
GLUCOSE, UA: NEGATIVE mg/dL
Ketones, ur: NEGATIVE mg/dL
Leukocytes, UA: NEGATIVE
Nitrite: NEGATIVE
PH: 5 (ref 5.0–8.0)
Protein, ur: NEGATIVE mg/dL
Specific Gravity, Urine: 1.01 (ref 1.005–1.030)
Urobilinogen, UA: 0.2 mg/dL (ref 0.0–1.0)

## 2013-12-27 LAB — BASIC METABOLIC PANEL
BUN: 10 mg/dL (ref 6–23)
CALCIUM: 9.2 mg/dL (ref 8.4–10.5)
CO2: 28 mEq/L (ref 19–32)
Chloride: 95 mEq/L — ABNORMAL LOW (ref 96–112)
Creatinine, Ser: 0.62 mg/dL (ref 0.50–1.10)
Glucose, Bld: 96 mg/dL (ref 70–99)
POTASSIUM: 4.2 meq/L (ref 3.7–5.3)
SODIUM: 135 meq/L — AB (ref 137–147)

## 2013-12-27 LAB — URINE MICROSCOPIC-ADD ON

## 2013-12-27 LAB — POCT PREGNANCY, URINE: Preg Test, Ur: NEGATIVE

## 2013-12-27 LAB — D-DIMER, QUANTITATIVE: D-Dimer, Quant: 0.51 ug/mL-FEU — ABNORMAL HIGH (ref 0.00–0.48)

## 2013-12-27 MED ORDER — HYDROCODONE-ACETAMINOPHEN 5-325 MG PO TABS: 2.0000 | ORAL_TABLET | ORAL | Status: DC | PRN

## 2013-12-27 MED ORDER — HYDROCODONE-ACETAMINOPHEN 5-325 MG PO TABS
1.0000 | ORAL_TABLET | Freq: Once | ORAL | Status: AC
  Administered 2013-12-27: 1 via ORAL

## 2013-12-27 MED ORDER — HYDROCODONE-ACETAMINOPHEN 5-325 MG PO TABS
1.0000 | ORAL_TABLET | Freq: Once | ORAL | Status: AC
  Administered 2013-12-27: 1 via ORAL
  Filled 2013-12-27: qty 1

## 2013-12-27 MED ORDER — SODIUM CHLORIDE 0.9 % IV BOLUS (SEPSIS)
1000.0000 mL | Freq: Once | INTRAVENOUS | Status: AC
  Administered 2013-12-27: 1000 mL via INTRAVENOUS

## 2013-12-27 MED ORDER — HYDROCODONE-ACETAMINOPHEN 5-325 MG PO TABS
ORAL_TABLET | ORAL | Status: AC
  Filled 2013-12-27: qty 1

## 2013-12-27 MED ORDER — IOHEXOL 350 MG/ML SOLN
100.0000 mL | Freq: Once | INTRAVENOUS | Status: AC | PRN
  Administered 2013-12-27: 100 mL via INTRAVENOUS

## 2013-12-27 NOTE — ED Provider Notes (Signed)
I saw and evaluated the patient, reviewed the resident's note and I agree with the findings and plan.  EKG Interpretation   None       Pt with pleuritic pain, ?PE given history. CTA neg. D/C with pain meds and PCP followup.   Charles B. Bernette MayersSheldon, MD 12/27/13 [id number]

## 2013-12-27 NOTE — Discharge Instructions (Signed)
Follow up with your PCP, take tylenol for this pain.  Chest Wall Pain Chest wall pain is pain in or around the bones and muscles of your chest. It may take up to 6 weeks to get better. It may take longer if you must stay physically active in your work and activities.  CAUSES  Chest wall pain may happen on its own. However, it may be caused by:  A viral illness like the flu.  Injury.  Coughing.  Exercise.  Arthritis.  Fibromyalgia.  Shingles. HOME CARE INSTRUCTIONS   Avoid overtiring physical activity. Try not to strain or perform activities that cause pain. This includes any activities using your chest or your abdominal and side muscles, especially if heavy weights are used.  Put ice on the sore area.  Put ice in a plastic bag.  Place a towel between your skin and the bag.  Leave the ice on for 15-20 minutes per hour while awake for the first 2 days.  Only take over-the-counter or prescription medicines for pain, discomfort, or fever as directed by your caregiver. SEEK IMMEDIATE MEDICAL CARE IF:   Your pain increases, or you are very uncomfortable.  You have a fever.  Your chest pain becomes worse.  You have new, unexplained symptoms.  You have nausea or vomiting.  You feel sweaty or lightheaded.  You have a cough with phlegm (sputum), or you cough up blood. MAKE SURE YOU:   Understand these instructions.  Will watch your condition.  Will get help right away if you are not doing well or get worse. Document Released: 11/22/2005 Document Revised: 02/14/2012 Document Reviewed: 07/19/2011 [facility] LLCExitCare Patient Information 2014 [place], [place].
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
## 2013-12-27 NOTE — ED Notes (Signed)
Pt back from x-ray.

## 2013-12-27 NOTE — ED Provider Notes (Signed)
CSN: [id number]     Arrival date & time 12/27/13  1107 History   First MD Initiated Contact with Patient 12/27/13 1259     Chief Complaint  Patient presents with  . Flank Pain   (Consider location/radiation/quality/duration/timing/severity/associated sxs/prior Treatment) HPI Comments: 38 year old female on home oxygen therapy with history of upper extremity DVT, pulmonary embolism, COPD, hepatic cirrhosis, pulmonary hypertension, congestive heart failure, cor pulmonale presents complaining of right flank pain. This started on Monday, 3 days ago. She has pain in her side that is made severely worse with clearing her throat. It is also made worse by taking a deep breath, and she occasionally feels a twinge of pain if she turns her body a specific direction. She has tried taking Tylenol for this but it has not helped. She denies any other associated new symptoms at this time.  Patient is a 38 y.o. female presenting with flank pain.  Flank Pain Associated symptoms include chest pain. Pertinent negatives include no abdominal pain and no shortness of breath.    Past Medical History  Diagnosis Date  . COPD (chronic obstructive pulmonary disease)     PFTS 03/08/12: fev1 0.58L/1%, FVC 1.18/33%, Ratop 49 and c/w ssevere obstruction. 21% BD response on FVC, RV 219%, DLCO 11/54%  . History of alcoholism   . Irregular menses   . DVT of upper extremity (deep vein thrombosis) April 2013    right subclavian // Unclear precipitating cause - possibly significant right heart failure, with vascular stasis potentially predisposing to clotting.    . Pulmonary embolus April 2013    Precipitating cause unclear. Was treated with coumadin from April-June 2013.  . Moderate to severe pulmonary hypertension April 2013    Cardiac cath on 03/06/12 - 1. Elevated pulmonary artery pressures, right sided filling pressures.,  2. PA: 64/45 (mean 53)    . Hepatic cirrhosis     Questionable history of - Noted on CT abdomen  (03/2012) - thought to be due to vascular congestion from right heart failure +/- patient's history of alcohol abuse  . Axillary adenopathy     right axillary adenopathy noted on CT chest (03/06/2012)  . Periodontal disease   . Chronic right-sided CHF (congestive heart failure) 03/23/2012    with cor pulmonale. Last RHC 03/2012  . Pneumonia ~ 1985; 01/2011  . History of chronic bronchitis   . Exertional shortness of breath   . On home oxygen therapy     "2-3 L 24/7" (05/03/2013)  . OSA (obstructive sleep apnea)     wears noctural BiPAP (05/03/2013)  . Migraine     "~ 1/yr" (05/03/2013)   Past Surgical History  Procedure Laterality Date  . Cardiac surgery  10/16/1977    "my heart was backwards" (05/03/2013)  . Lung surgery  10/16/1977  . Appendectomy  05/03/2013  . Cardiac catheterization  03/2012  . Laparoscopic appendectomy N/A 05/03/2013    Procedure: APPENDECTOMY LAPAROSCOPIC;  Surgeon: Cherylynn RidgesJames O Wyatt, MD;  [facility];  Service: General;  Laterality: N/A;   Family History  Problem Relation Age of Onset  . Hypertension Father   . Heart disease Father     CHF; died age 37   . Alcohol abuse Father   . Other Brother     died age 37 y.o overdose   History  Substance Use Topics  . Smoking status: Former Smoker -- 1.00 packs/day for 25 years    Types: Cigarettes    Quit date: 02/18/2012  . Smokeless tobacco: Never  Used  . Alcohol Use: No     Comment: previously drinking 1 pint 3-4 days a week for 2002-2013, but in 2014, no longer drinking   OB History   Grav Para Term Preterm Abortions TAB SAB Ect Mult Living                 Review of Systems  Constitutional: Negative for fever and chills.  Eyes: Negative for visual disturbance.  Respiratory: Negative for cough and shortness of breath.   Cardiovascular: Positive for chest pain. Negative for palpitations and leg swelling.  Gastrointestinal: Negative for nausea, vomiting and abdominal pain.  Endocrine: Negative for  polydipsia and polyuria.  Genitourinary: Positive for flank pain. Negative for dysuria, urgency and frequency.  Musculoskeletal: Negative for arthralgias and myalgias.  Skin: Negative for rash.  Neurological: Negative for dizziness, weakness and light-headedness.    Allergies  Review of patient's allergies indicates no known allergies.  Home Medications   Current Outpatient Rx  Name  Route  Sig  Dispense  Refill  . Fluticasone-Salmeterol (ADVAIR) 250-50 MCG/DOSE AEPB   Inhalation   Inhale 1 puff into the lungs every 12 (twelve) hours.   60 each   0   . tiotropium (SPIRIVA) 18 MCG inhalation capsule   Inhalation   Place 1 capsule (18 mcg total) into inhaler and inhale daily.   30 capsule   5   . VENTOLIN HFA 108 (90 BASE) MCG/ACT inhaler      USE 2 PUFFS EVERY 6 HOURS AS NEEDED FOR WHEEZING   18 each   2     Needs OV   . predniSONE (DELTASONE) 10 MG tablet      4 pills for 2 days, 3 pills for 2 days, 2 pills for 2 days, 1 pill for 2 days   20 tablet   0    BP 121/64  Pulse 65  Temp(Src) 98.2 F (36.8 C) (Oral)  Resp 20  SpO2 100%  LMP 12/27/2013 Physical Exam  Nursing note and vitals reviewed. Constitutional: She is oriented to person, place, and time. Vital signs are normal. She appears well-developed and well-nourished. No distress.  HENT:  Head: Normocephalic and atraumatic.  Pulmonary/Chest: Effort normal. No respiratory distress.  Abdominal: There is no tenderness. There is no rigidity, no rebound, no guarding, no CVA tenderness, no tenderness at McBurney's point and negative Murphy's sign.  Neurological: She is alert and oriented to person, place, and time. She has normal strength. Coordination normal.  Skin: Skin is warm and dry. No rash noted. She is not diaphoretic.  Psychiatric: She has a normal mood and affect. Judgment normal.    ED Course  Procedures (including critical care time) Labs Review Labs Reviewed - No data to display Imaging  Review No results found.    MDM   1. Pleuritic chest pain   2. History of pulmonary embolism    Differential includes PE, pleural effusion, pneumonia, or intra-abdominal infection. Discussed possibilities with the patient, given her history, she would likely benefit from a CT angiogram to rule out PE. She is being transferred to the emergency department.  Given 1 Norco 5/325 here for pain     Graylon Good, PA-C 12/27/13 1340

## 2013-12-27 NOTE — ED Provider Notes (Signed)
Medical screening examination/treatment/procedure(s) were performed by non-physician practitioner and as supervising physician I was immediately available for consultation/collaboration.  Laneka Mcgrory, M.D.   Osha Errico C Ilene Witcher, MD 12/27/13 1804 

## 2013-12-27 NOTE — ED Provider Notes (Signed)
CSN: [id number]     Arrival date & time 12/27/13  1353 History   First MD Initiated Contact with Patient 12/27/13 1507     Chief Complaint  Patient presents with  . Pain   (Consider location/radiation/quality/duration/timing/severity/associated sxs/prior Treatment) Patient is a 38 y.o. female presenting with flank pain. The history is provided by the patient and medical records.  Flank Pain This is a new problem. The current episode started in the past 7 days. The problem occurs constantly. The problem has been unchanged. Associated symptoms include coughing. Pertinent negatives include no abdominal pain, arthralgias, chest pain, chills, congestion, fatigue, fever, headaches, joint swelling, myalgias, nausea, numbness, visual change or vomiting. The symptoms are aggravated by coughing. She has tried acetaminophen for the symptoms. The treatment provided no relief.    Past Medical History  Diagnosis Date  . COPD (chronic obstructive pulmonary disease)     PFTS 03/08/12: fev1 0.58L/1%, FVC 1.18/33%, Ratop 49 and c/w ssevere obstruction. 21% BD response on FVC, RV 219%, DLCO 11/54%  . History of alcoholism   . Irregular menses   . DVT of upper extremity (deep vein thrombosis) April 2013    right subclavian // Unclear precipitating cause - possibly significant right heart failure, with vascular stasis potentially predisposing to clotting.    . Pulmonary embolus April 2013    Precipitating cause unclear. Was treated with coumadin from April-June 2013.  . Moderate to severe pulmonary hypertension April 2013    Cardiac cath on 03/06/12 - 1. Elevated pulmonary artery pressures, right sided filling pressures.,  2. PA: 64/45 (mean 53)    . Hepatic cirrhosis     Questionable history of - Noted on CT abdomen (03/2012) - thought to be due to vascular congestion from right heart failure +/- patient's history of alcohol abuse  . Axillary adenopathy     right axillary adenopathy noted on CT chest  (03/06/2012)  . Periodontal disease   . Chronic right-sided CHF (congestive heart failure) 03/23/2012    with cor pulmonale. Last RHC 03/2012  . Pneumonia ~ 1985; 01/2011  . History of chronic bronchitis   . Exertional shortness of breath   . On home oxygen therapy     "2-3 L 24/7" (05/03/2013)  . OSA (obstructive sleep apnea)     wears noctural BiPAP (05/03/2013)  . Migraine     "~ 1/yr" (05/03/2013)   Past Surgical History  Procedure Laterality Date  . Cardiac surgery  01-15-77    "my heart was backwards" (05/03/2013)  . Lung surgery  1977-03-22  . Appendectomy  05/03/2013  . Cardiac catheterization  03/2012  . Laparoscopic appendectomy N/A 05/03/2013    Procedure: APPENDECTOMY LAPAROSCOPIC;  Surgeon: Cherylynn Ridges, MD;  Location: [facility] OR;  Service: General;  Laterality: N/A;   Family History  Problem Relation Age of Onset  . Hypertension Father   . Heart disease Father     CHF; died age 61   . Alcohol abuse Father   . Other Brother     died age 63 y.o overdose   History  Substance Use Topics  . Smoking status: Former Smoker -- 1.00 packs/day for 25 years    Types: Cigarettes    Quit date: 02/18/2012  . Smokeless tobacco: Never Used  . Alcohol Use: No     Comment: previously drinking 1 pint 3-4 days a week for 2002-2013, but in 2014, no longer drinking   OB History   Grav Para Term Preterm Abortions TAB SAB Ect Mult  Living                 Review of Systems  Constitutional: Negative for fever, chills and fatigue.  HENT: Negative for congestion and rhinorrhea.   Eyes: Negative for redness and visual disturbance.  Respiratory: Positive for cough. Negative for shortness of breath and wheezing.   Cardiovascular: Negative for chest pain and palpitations.  Gastrointestinal: Negative for nausea, vomiting and abdominal pain.  Genitourinary: Positive for flank pain. Negative for dysuria and urgency.  Musculoskeletal: Negative for arthralgias, joint swelling and myalgias.  Skin:  Negative for pallor and wound.  Neurological: Negative for dizziness, numbness and headaches.    Allergies  Review of patient's allergies indicates no known allergies.  Home Medications   Current Outpatient Rx  Name  Route  Sig  Dispense  Refill  . albuterol (PROVENTIL HFA;VENTOLIN HFA) 108 (90 BASE) MCG/ACT inhaler   Inhalation   Inhale 2 puffs into the lungs every 6 (six) hours as needed for wheezing or shortness of breath.         . predniSONE (DELTASONE) 10 MG tablet      4 pills for 2 days, 3 pills for 2 days, 2 pills for 2 days, 1 pill for 2 days   20 tablet   0   . tiotropium (SPIRIVA) 18 MCG inhalation capsule   Inhalation   Place 1 capsule (18 mcg total) into inhaler and inhale daily.   30 capsule   5   . Fluticasone-Salmeterol (ADVAIR) 250-50 MCG/DOSE AEPB   Inhalation   Inhale 1 puff into the lungs every 12 (twelve) hours.   60 each   0   . HYDROcodone-acetaminophen (NORCO/VICODIN) 5-325 MG per tablet   Oral   Take 2 tablets by mouth every 4 (four) hours as needed.   6 tablet   0    BP 117/81  Pulse 77  Temp(Src) 97.8 F (36.6 C) (Oral)  Resp 18  Ht 5\' 3"  (1.6 m)  Wt 187 lb (84.823 kg)  BMI 33.13 kg/m2  SpO2 98%  LMP 12/27/2013 Physical Exam  Constitutional: She is oriented to person, place, and time. She appears well-developed and well-nourished. No distress.  HENT:  Head: Normocephalic and atraumatic.  Eyes: EOM are normal. Pupils are equal, round, and reactive to light.  Neck: Normal range of motion. Neck supple.  Cardiovascular: Normal rate and regular rhythm.  Exam reveals no gallop and no friction rub.   No murmur heard. Pulmonary/Chest: Effort normal. She has no wheezes. She has no rales. She exhibits tenderness (R sided about ribs 8-10).  Abdominal: Soft. She exhibits no distension. There is no tenderness.  Musculoskeletal: She exhibits no edema and no tenderness.  Neurological: She is alert and oriented to person, place, and time.   Skin: Skin is warm and dry. She is not diaphoretic.  Psychiatric: She has a normal mood and affect. Her behavior is normal.    ED Course  Procedures (including critical care time) Labs Review Labs Reviewed  CBC - Abnormal; Notable for the following:    Hemoglobin 15.6 (*)    HCT 46.8 (*)    All other components within normal limits  BASIC METABOLIC PANEL - Abnormal; Notable for the following:    Sodium 135 (*)    Chloride 95 (*)    All other components within normal limits  D-DIMER, QUANTITATIVE - Abnormal; Notable for the following:    D-Dimer, Quant 0.51 (*)    All other components within normal limits  URINE CULTURE  URINALYSIS, ROUTINE W REFLEX MICROSCOPIC  POCT PREGNANCY, URINE   Imaging Review Dg Chest 2 View  12/27/2013   CLINICAL DATA:  Right lateral chest pain, shortness of breath  EXAM: CHEST  2 VIEW  COMPARISON:  September 18, 2013  FINDINGS: The heart size and mediastinal contours are within normal limits. There is no focal infiltrate, pulmonary edema, or pleural effusion. Mild chronic scar of left lung base is unchanged. The visualized skeletal structures are stable.  IMPRESSION: No active cardiopulmonary disease.   Electronically Signed   By: Sherian Rein M.D.   On: 12/27/2013 15:35   Ct Angio Chest Pe W/cm &/or Wo Cm  12/27/2013   CLINICAL DATA:  Right lower chest pain. Pain is worse with movement.  EXAM: CT ANGIOGRAPHY CHEST WITH CONTRAST  TECHNIQUE: Multidetector CT imaging of the chest was performed using the standard protocol during bolus administration of intravenous contrast. Multiplanar CT image reconstructions including MIPs were obtained to evaluate the vascular anatomy.  CONTRAST:  OMNIPAQUE IOHEXOL 350 MG/ML SOLN  COMPARISON:  Chest radiograph December 27, 2013 and CT of the chest February 28, 2013  FINDINGS: Technically adequate examination. Main pulmonary artery is not enlarged. No pulmonary arterial filling defects to the level of the there is of the  segmental branches. Heart size is normal, pericardium is unremarkable.  Biapical pleural scarring again noted, with patchy areas of heterogeneous lung parenchymal attenuation, as previously reported. Similar nodular scarring within the periphery of the left lower lobe, lateral segment, to lesser extent right lower lobe, lateral segment. Trace left upper lobe, anterior segment bronchiectasis, (axial 50/145) as well as right upper lobe posterior segment bronchiectasis (axial 38/145). Tracheobronchial tree is patent and midline, with trace bronchial wall thickening. No pneumothorax.  Decrease in size of mediastinal lymphadenopathy, with small right perihilar and subcarinal lymph nodes.  Hepatomegaly with fatty liver partially imaged. Osseous structures are nonsuspicious.  Review of the MIP images confirms the above findings.  IMPRESSION: No pulmonary embolism.  Similar mosaic profusion suggesting small airway disease with mild bronchial wall thickening, patchy areas of scarring, and trace bronchiectasis.  Hepatomegaly and fatty liver, partially imaged.   Electronically Signed   By: Awilda Metro   On: 12/27/2013 17:08    EKG Interpretation   None       MDM   1. Right flank pain   2. Chest wall pain     Patient is a 38 year old female with a chief complaint of right thigh pain. Patient went to urgent care and they're concerned for possible PE and she was transferred here. He has had a PE in the past been off anticoagulation for about a year. The leg this is different than her PE pain. Patient denies any fevers denies any shortness of breath patient has had a cough for the past 5 or 6 days feels her cough has made her chest pain worse.   On exam patient without any palpable muscle skeletal tenderness along the right side. Patient states the pain is between ribs 8 and 10 about mid axillary line.  Due to patient's symptomatology and past pulmonary embolism we obtain the CT a chest. This was negative  for pulmonary embolism. There is no other noted cause for her pain in that area.  Will check ua, upreg.  upreg neg.  7:28 PM:  I have discussed the diagnosis/risks/treatment options with the patient and family and believe the pt to be eligible for discharge home to follow-up with PCP. We  also discussed returning to the ED immediately if new or worsening sx occur. We discussed the sx which are most concerning (e.g., syncope, worsening CP) that necessitate immediate return. Any new prescriptions provided to the patient are listed below.  New Prescriptions   HYDROCODONE-ACETAMINOPHEN (NORCO/VICODIN) 5-325 MG PER TABLET    Take 2 tablets by mouth every 4 (four) hours as needed.     Melene Plan, MD 12/27/13 [phone]

## 2013-12-27 NOTE — ED Notes (Signed)
C/o pain in lower right side that started on Monday. Pain worsens with any movement, sharp, stabbing pain when moving, dull when not. Pain is 4/10 with out any movement and 8/10 when moving. Stated that she has tried Tylenol with no relief. Written by: Marga MelnickQuaNeisha Jones, SMA

## 2013-12-27 NOTE — ED Notes (Signed)
Sent here from Urgent Care -- with c/o right upper quad/right side pain-- "hurts to cough/breathe/or move"

## 2013-12-28 LAB — URINE CULTURE
CULTURE: NO GROWTH
Colony Count: NO GROWTH

## 2013-12-31 ENCOUNTER — Ambulatory Visit: Payer: 59

## 2013-12-31 ENCOUNTER — Ambulatory Visit (INDEPENDENT_AMBULATORY_CARE_PROVIDER_SITE_OTHER): Payer: 59 | Admitting: Internal Medicine

## 2013-12-31 VITALS — BP 122/70 | HR 79 | Temp 98.2°F | Resp 18 | Ht 63.0 in | Wt 186.0 lb

## 2013-12-31 DIAGNOSIS — R071 Chest pain on breathing: Secondary | ICD-10-CM

## 2013-12-31 DIAGNOSIS — R091 Pleurisy: Secondary | ICD-10-CM

## 2013-12-31 DIAGNOSIS — R0789 Other chest pain: Secondary | ICD-10-CM

## 2013-12-31 LAB — POCT CBC
Granulocyte percent: 63.6 %G (ref 37–80)
HEMATOCRIT: 48.8 % — AB (ref 37.7–47.9)
Hemoglobin: 15.2 g/dL (ref 12.2–16.2)
LYMPH, POC: 2.4 (ref 0.6–3.4)
MCH, POC: 31.1 pg (ref 27–31.2)
MCHC: 31.1 g/dL — AB (ref 31.8–35.4)
MCV: 99.8 fL — AB (ref 80–97)
MID (CBC): 0.6 (ref 0–0.9)
MPV: 8.4 fL (ref 0–99.8)
POC GRANULOCYTE: 5.3 (ref 2–6.9)
POC LYMPH %: 29.1 % (ref 10–50)
POC MID %: 7.3 % (ref 0–12)
Platelet Count, POC: 205 10*3/uL (ref 142–424)
RBC: 4.89 M/uL (ref 4.04–5.48)
RDW, POC: 13.4 %
WBC: 8.4 10*3/uL (ref 4.6–10.2)

## 2013-12-31 MED ORDER — MELOXICAM 15 MG PO TABS: 15.0000 mg | ORAL_TABLET | Freq: Every day | ORAL | Status: DC

## 2013-12-31 MED ORDER — KETOROLAC TROMETHAMINE 60 MG/2ML IM SOLN
60.0000 mg | Freq: Once | INTRAMUSCULAR | Status: AC
  Administered 2013-12-31: 60 mg via INTRAMUSCULAR

## 2013-12-31 MED ORDER — HYDROCODONE-ACETAMINOPHEN 5-325 MG PO TABS: 2.0000 | ORAL_TABLET | Freq: Four times a day (QID) | ORAL | Status: DC | PRN

## 2013-12-31 NOTE — Progress Notes (Addendum)
Subjective:    Patient ID: Mary [name], female    DOB: 01/19/1976, [age] y.o.   MRN: [id number] This chart was scribed for Mary Siaobert Doolittle, MD by Nicholos Johnsenise Iheanachor, Medical Scribe. This patient's care was started at 7:53 PM.  Flank Pain Pertinent negatives include no abdominal pain, dysuria, fever or pelvic pain.   HPI Comments: Mary [name] is a 38 y.o. female who presents to the [facility] complaining of gradually worsening, constant right sided chest wall pain. Pain worse with breathing, coughing, yawning, or movement. Pt prefers to still in a specific position to avoid pain.  Denies any injury or trauma related to pain. On 1/22 she was evaluated at the [facility] care and in the emergency room with the same thing - labs normal including EKG except for a slightly elevated d-dimer. CT angiography was within normal limits She is continued to get worse but has not had any fever or cough or change in her COPD status Pt states she was on her period at the time blood was found in her urine at ER visit. Pt denies dysuria. Has no symptoms of kidney stone or prior episode of kidney stone. Pt is on oxygen in examination room and uses regularly at home-COPD-is s/p PTE and CHF(S/P heart surgery as well)    Patient Active Problem List   Diagnosis Date Noted   Dyspnea 09/18/2013   Hypokalemia 02/28/2013   Acute-on-chronic respiratory failure 02/28/2013   Financial difficulties 02/28/2013   Obesity hypoventilation syndrome 02/28/2013   Elevated troponin 02/28/2013   Myalgia 02/28/2013   Influenza with respiratory manifestations 02/28/2013   COPD exacerbation 09/22/2012   Anovulation 06/30/2012   Preventative health care 05/05/2012   Irregular menstrual cycle 04/27/2012   Chronic right-sided CHF (congestive heart failure) 03/23/2012   Pulmonary hypertension 03/23/2012   Cor pulmonale 03/21/2012   Chronic respiratory failure with hypercapnia  03/21/2012   OSA on BiPAP 03/19/2012   Axillary lymphadenopathy 03/06/2012   Congenital heart disease 01/28/2012   Obesity 01/28/2012   Dental caries 01/28/2012   COPD (chronic obstructive pulmonary disease) 01/26/2012   Current outpatient prescriptions:albuterol (PROVENTIL HFA;VENTOLIN HFA) 108 (90 BASE) MCG/ACT inhaler, Inhale 2 puffs into the lungs every 6 (six) hours as needed for wheezing or shortness of breath., Disp: , Rfl: ;   Fluticasone-Salmeterol (ADVAIR) 250-50 MCG/DOSE AEPB, Inhale 1 puff into the lungs every 12 (twelve) hours., Disp: 60 each, Rfl: 0 tiotropium (SPIRIVA) 18 MCG inhalation capsule, Place 1 capsule (18 mcg total) into inhaler and inhale daily., Disp: 30 capsule, Rfl: 5;      Review of Systems  Constitutional: Negative for fever, appetite change and unexpected weight change.  Cardiovascular: Negative for palpitations and leg swelling.  Gastrointestinal: Negative for abdominal pain, diarrhea, constipation and abdominal distention.  Genitourinary: Negative for dysuria, urgency, frequency, menstrual problem and pelvic pain.   Objective:  Physical Exam  Nursing note and vitals reviewed. Constitutional: She is oriented to person, place, and time. She appears well-developed and well-nourished. No distress.  HENT:  Head: Normocephalic and atraumatic.  Eyes: EOM are normal.  Neck: Neck supple. No tracheal deviation present.  Cardiovascular: Normal rate, regular rhythm and normal heart sounds.  Exam reveals no gallop and no friction rub.   No murmur heard. Pulmonary/Chest: Effort normal and breath sounds normal. No respiratory distress. She has no wheezes. She has no rales.  She has 3 L of oxygen in place as usual There is tenderness along the lateral chest wall in  the anterior axillary line extending to the posterior axillary line without swelling or rib defect/no redness or rash She splints with deep inspiration  Musculoskeletal: Normal range of motion.    Neurological: She is alert and oriented to person, place, and time.  Skin: Skin is warm and dry.  Psychiatric: She has a normal mood and affect. Her behavior is normal.  [facility] reading (PRIMARY) by  Dr.Doolittle= no infiltrate/no effusion   Assessment & Plan:   chest wall pain with pleuritic component of unclear etiology  Toradol 60 IM Meloxicam 15 Heat Hydrocodone as needed Follow up one week if not better D-dimer and proceed to repeat angiography if markedly positive

## 2014-01-01 LAB — D-DIMER, QUANTITATIVE (NOT AT ARMC): D DIMER QUANT: 0.44 ug{FEU}/mL (ref 0.00–0.48)

## 2014-01-07 ENCOUNTER — Ambulatory Visit (INDEPENDENT_AMBULATORY_CARE_PROVIDER_SITE_OTHER): Payer: 59 | Admitting: Internal Medicine

## 2014-01-07 VITALS — BP 114/84 | HR 84 | Temp 98.1°F | Resp 18 | Ht 61.5 in | Wt 188.0 lb

## 2014-01-07 DIAGNOSIS — R0789 Other chest pain: Secondary | ICD-10-CM

## 2014-01-07 DIAGNOSIS — R079 Chest pain, unspecified: Secondary | ICD-10-CM

## 2014-01-07 DIAGNOSIS — R071 Chest pain on breathing: Secondary | ICD-10-CM

## 2014-01-07 MED ORDER — KETOROLAC TROMETHAMINE 30 MG/ML IJ SOLN
30.0000 mg | Freq: Once | INTRAMUSCULAR | Status: AC
  Administered 2014-01-07: 30 mg via INTRAMUSCULAR

## 2014-01-07 MED ORDER — HYDROCODONE-ACETAMINOPHEN 5-325 MG PO TABS: 1.0000 | ORAL_TABLET | Freq: Four times a day (QID) | ORAL | Status: DC | PRN

## 2014-01-07 NOTE — Progress Notes (Deleted)
Chief Complaint:  Chief Complaint  Patient presents with  . Follow-up    pain on right side not improving    HPI: Mary [name] is a 38 y.o. female who is here for pain on right side. Was told by Dr. Merla Riches to return if symptoms worsen. Did not have a ride to come back Saturday when he was here. Has history of pulmonary embolism she is concerned that pain is progressively worse. Dr. Merla Riches told her it was due to torn muscle. She has ran out of pain medicine and unable to manage pain. She states that it feels better when she puts pressure on it or lean on her right side. Was given Toradol during last visit and she didn't have any pain after. She would like another shot of Toradol and some more pain medicine. Patient states that it feels like someone is stabbing her with an ice pick when she breaths.   Past Medical History  Diagnosis Date  . COPD (chronic obstructive pulmonary disease)     PFTS 03/08/12: fev1 0.58L/1%, FVC 1.18/33%, Ratop 49 and c/w ssevere obstruction. 21% BD response on FVC, RV 219%, DLCO 11/54%  . History of alcoholism   . Irregular menses   . DVT of upper extremity (deep vein thrombosis) April 2013    right subclavian // Unclear precipitating cause - possibly significant right heart failure, with vascular stasis potentially predisposing to clotting.    . Pulmonary embolus April 2013    Precipitating cause unclear. Was treated with coumadin from April-June 2013.  . Moderate to severe pulmonary hypertension April 2013    Cardiac cath on 03/06/12 - 1. Elevated pulmonary artery pressures, right sided filling pressures.,  2. PA: 64/45 (mean 53)    . Hepatic cirrhosis     Questionable history of - Noted on CT abdomen (03/2012) - thought to be due to vascular congestion from right heart failure +/- patient's history of alcohol abuse  . Axillary adenopathy     right axillary adenopathy noted on CT chest (03/06/2012)  . Periodontal disease   . Chronic right-sided  CHF (congestive heart failure) 03/23/2012    with cor pulmonale. Last RHC 03/2012  . Pneumonia ~ 1985; 01/2011  . History of chronic bronchitis   . Exertional shortness of breath   . On home oxygen therapy     "2-3 L 24/7" (05/03/2013)  . OSA (obstructive sleep apnea)     wears noctural BiPAP (05/03/2013)  . Migraine     "~ 1/yr" (05/03/2013)   Past Surgical History  Procedure Laterality Date  . Cardiac surgery  1977-05-29    "my heart was backwards" (05/03/2013)  . Lung surgery  10-27-77  . Appendectomy  05/03/2013  . Cardiac catheterization  03/2012  . Laparoscopic appendectomy N/A 05/03/2013    Procedure: APPENDECTOMY LAPAROSCOPIC;  Surgeon: Cherylynn Ridges, MD;  Location: [facility] OR;  Service: General;  Laterality: N/A;   History   Social History  . Marital Status: Married    Spouse Name: N/A    Number of Children: 0  . Years of Education: college   Occupational History  . Unemployed     [employer]   Social History Main Topics  . Smoking status: Former Smoker -- 1.00 packs/day for 25 years    Types: Cigarettes    Quit date: 02/18/2012  . Smokeless tobacco: Never Used  . Alcohol Use: No     Comment: previously drinking 1 pint 3-4 days a week for  2002-2013, but in 2014, no longer drinking  . Drug Use: No  . Sexual Activity: Yes    Partners: Male   Other Topics Concern  . None   Social History Narrative   [place]ives with [place] in [place]   Has a farm with multiple animals including chickens, [place]inea pigs, cockatil (bird), dogs and cats   Completed GED and 2 years of college   Smoking-quit smoking >1 year since 2014   Not working    Family History  Problem Relation Age of Onset  . Hypertension Father   . Heart disease Father     CHF; died age 37   . Alcohol abuse Father   . Other Brother     died age 37 y.o overdose   No Known Allergies Prior to Admission medications   Medication Sig Start Date End Date Taking? Authorizing Provider  albuterol (PROVENTIL HFA;VENTOLIN  HFA) 108 (90 BASE) MCG/ACT inhaler Inhale 2 puffs into the lungs every 6 (six) hours as needed for wheezing or shortness of breath.   Yes Historical Provider, MD  Fluticasone-Salmeterol (ADVAIR) 250-50 MCG/DOSE AEPB Inhale 1 puff into the lungs every 12 (twelve) hours. 04/26/13  Yes Kalman ShanMurali Ramaswamy, MD  meloxicam (MOBIC) 15 MG tablet Take 1 tablet (15 mg total) by mouth daily. 12/31/13  Yes Tonye Pearsonobert P Zoran Yankee, MD  tiotropium (SPIRIVA) 18 MCG inhalation capsule Place 1 capsule (18 mcg total) into inhaler and inhale daily. 09/21/12  Yes Kalman ShanMurali Ramaswamy, MD  HYDROcodone-acetaminophen (NORCO/VICODIN) 5-325 MG per tablet Take 2 tablets by mouth every 6 (six) hours as needed. 12/31/13   Tonye Pearsonobert P Jakara Blatter, MD  predniSONE (DELTASONE) 10 MG tablet 4 pills for 2 days, 3 pills for 2 days, 2 pills for 2 days, 1 pill for 2 days 09/18/13   Coralyn HellingVineet Sood, MD     ROS: The patient denies fevers, chills, night sweats, unintentional weight loss, chest pain, palpitations, wheezing, dyspnea on exertion, nausea, vomiting, abdominal pain, dysuria, hematuria, melena, numbness, weakness, or tingling.   All other systems have been reviewed and were otherwise negative with the exception of those mentioned in the HPI and as above.    PHYSICAL EXAM: Filed Vitals:   01/07/14 1456  BP: 114/84  Pulse: 84  Temp: 98.1 F (36.7 C)  Resp: 18   Filed Vitals:   01/07/14 1456  Height: 5' 1.5" (1.562 m)  Weight: 188 lb (85.276 kg)   Body mass index is 34.95 kg/(m^2).  General: Alert, no acute distress HEENT:  Normocephalic, atraumatic, oropharynx patent. EOMI, PERRLA Cardiovascular:  Regular rate and rhythm, no rubs murmurs or gallops.  No Carotid bruits, radial pulse intact. No pedal edema.  Respiratory: Clear to auscultation bilaterally.  No wheezes, rales, or rhonchi.  No cyanosis, no use of accessory musculature GI: No organomegaly, abdomen is soft and non-tender, positive bowel sounds.  No masses. Skin: No  rashes. Neurologic: Facial musculature symmetric. Psychiatric: Patient is appropriate throughout our interaction. Lymphatic: No cervical lymphadenopathy Musculoskeletal: Gait intact.   LABS: Results for orders placed in visit on 12/31/13  D-DIMER, QUANTITATIVE      Result Value Range   D-Dimer, Quant 0.44  0.00 - 0.48 ug/mL-FEU  POCT CBC      Result Value Range   WBC 8.4  4.6 - 10.2 K/uL   Lymph, poc 2.4  0.6 - 3.4   POC LYMPH PERCENT 29.1  10 - 50 %L   MID (cbc) 0.6  0 - 0.9   POC MID % 7.3  0 -  12 %M   POC Granulocyte 5.3  2 - 6.9   Granulocyte percent 63.6  37 - 80 %G   RBC 4.89  4.04 - 5.48 M/uL   Hemoglobin 15.2  12.2 - 16.2 g/dL   HCT, POC 16.1 (*) 09.6 - 47.9 %   MCV 99.8 (*) 80 - 97 fL   MCH, POC 31.1  27 - 31.2 pg   MCHC 31.1 (*) 31.8 - 35.4 g/dL   RDW, POC 04.5     Platelet Count, POC 205  142 - 424 K/uL   MPV 8.4  0 - 99.8 fL     EKG/XRAY:   Primary read interpreted by Dr. Conley Rolls at [facility].   ASSESSMENT/PLAN: No diagnosis found.   Gross sideeffects, risk and benefits, and alternatives of medications d/w patient. Patient is aware that all medications have potential sideeffects and we are unable to predict every sideeffect or drug-drug interaction that may occur.  Mila Merry, [facility] 01/07/2014 3:27 PM  THIS note was entered in error by CMA RPD

## 2014-01-07 NOTE — Progress Notes (Addendum)
Subjective:   This chart was scribed for Ellamae Sia, MD by Arlan Organ, [facility] Scribe. This patient was seen in room 8 and the patient's care was started 3:49 PM.   Patient ID: Mary [name], female    DOB: 1976/06/12, 38 y.o.   MRN: [id number]  HPI  HPI Comments: Mary [name] is a 38 y.o. Female with multiple medical problems including COPD who presents to [facility] complaining of ongoing, constant, moderate right sided chest wall pain that initially started 2 weeks ago. Pt was recently seen here on 1/26 for the same complaint. She states the pain has not worsened, but has not noted any improvement since previous visit. Pt states prescribed pain medication and anti-inflammatory makes her discomfort "barable", and states deep breathing, twisting, and bending worsens her discomfort at times. She says she experienced much relief from the Toradol shot she received during her last visit. At this time she denies any fever. No new symptoms of cough or congestion.  Review of Systems  Constitutional: Negative for fever and chills.  Respiratory:       Right sided chest wall pain   no fever chills or night sweats No change in need for oxygen No edema No palpitations or syncope  Past Medical History  Diagnosis Date  . COPD (chronic obstructive pulmonary disease)     PFTS 03/08/12: fev1 0.58L/1%, FVC 1.18/33%, Ratop 49 and c/w ssevere obstruction. 21% BD response on FVC, RV 219%, DLCO 11/54%  . History of alcoholism   . Irregular menses   . DVT of upper extremity (deep vein thrombosis) April 2013    right subclavian // Unclear precipitating cause - possibly significant right heart failure, with vascular stasis potentially predisposing to clotting.    . Pulmonary embolus April 2013    Precipitating cause unclear. Was treated with coumadin from April-June 2013.  . Moderate to severe pulmonary hypertension April 2013    Cardiac cath on  03/06/12 - 1. Elevated pulmonary artery pressures, right sided filling pressures.,  2. PA: 64/45 (mean 53)    . Hepatic cirrhosis     Questionable history of - Noted on CT abdomen (03/2012) - thought to be due to vascular congestion from right heart failure +/- patient's history of alcohol abuse  . Axillary adenopathy     right axillary adenopathy noted on CT chest (03/06/2012)  . Periodontal disease   . Chronic right-sided CHF (congestive heart failure) 03/23/2012    with cor pulmonale. Last RHC 03/2012  . Pneumonia ~ 1985; 01/2011  . History of chronic bronchitis   . Exertional shortness of breath   . On home oxygen therapy     "2-3 L 24/7" (05/03/2013)  . OSA (obstructive sleep apnea)     wears noctural BiPAP (05/03/2013)  . Migraine     "~ 1/yr" (05/03/2013)    Past Surgical History  Procedure Laterality Date  . Cardiac surgery  05/25/77    "my heart was backwards" (05/03/2013)  . Lung surgery  05-10-77  . Appendectomy  05/03/2013  . Cardiac catheterization  03/2012  . Laparoscopic appendectomy N/A 05/03/2013    Procedure: APPENDECTOMY LAPAROSCOPIC;  Surgeon: Cherylynn Ridges, MD;  Location: [facility] OR;  Service: General;  Laterality: N/A;    History   Social History  . Marital Status: Married    Spouse Name: N/A    Number of Children: 0  . Years of Education: college   Occupational History  .  Unemployed     [employer]   Social History Main Topics  . Smoking status: Former Smoker -- 1.00 packs/day for 25 years    Types: Cigarettes    Quit date: 02/18/2012  . Smokeless tobacco: Never Used  . Alcohol Use: No     Comment: previously drinking 1 pint 3-4 days a week for 2002-2013, but in 2014, no longer drinking  . Drug Use: No  . Sexual Activity: Yes    Partners: Male   Other Topics Concern  . Not on file   Social History Narrative   [place]ives with [place] in [place]   Has a farm with multiple animals including chickens, [place]guinea pigs, cockatil (bird), dogs and cats   Completed  GED and 2 years of college   Smoking-quit smoking >1 year since 2014   Not working     Triage Vitals: BP 114/84  Pulse 84  Temp(Src) 98.1 F (36.7 C) (Oral)  Resp 18  Ht 5' 1.5" (1.562 m)  Wt 188 lb (85.276 kg)  BMI 34.95 kg/m2  SpO2 93%  LMP 12/27/2013   Objective:  Physical Exam  Nursing note and vitals reviewed. Constitutional: She is oriented to person, place, and time. She appears well-developed and well-nourished.  HENT:  Head: Normocephalic and atraumatic.  Eyes: EOM are normal.  Neck: Normal range of motion.  Cardiovascular: Normal rate.   Pulmonary/Chest: Effort normal. She exhibits tenderness.  Dullness at right base with diminished breath sounds  Tender along lower rib cage on right and posterior axial line Without swelling, ecchymosis, or defect  Musculoskeletal: Normal range of motion.  Neurological: She is alert and oriented to person, place, and time.  Skin: Skin is warm and dry.  Psychiatric: She has a normal mood and affect. Her behavior is normal.    Assessment & Plan:  I have completed the patient encounter in its entirety as documented by the scribe, with editing by me where necessary. Glorious Flicker P. Merla Richesoolittle, M.D.  Persistent chest wall pain of unclear etiology, probably musculoskeletal We'll continue treatment with pain meds and get follow up with Dr. Marchelle Gearingamaswamy for other ideas  Meds ordered this encounter  Medications  . HYDROcodone-acetaminophen (NORCO/VICODIN) 5-325 MG per tablet    Sig: Take 1-2 tablets by mouth every 6 (six) hours as needed.    Dispense:  40 tablet    Refill:  0  . ketorolac (TORADOL) 30 MG/ML injection 30 mg    Sig:

## 2014-01-13 ENCOUNTER — Encounter (HOSPITAL_COMMUNITY): Payer: Self-pay | Admitting: Emergency Medicine

## 2014-01-13 ENCOUNTER — Emergency Department (HOSPITAL_COMMUNITY)
Admission: EM | Admit: 2014-01-13 | Discharge: 2014-01-13 | Disposition: A | Discharge status: Home / Self Care | Payer: 59 | Attending: Emergency Medicine | Admitting: Emergency Medicine

## 2014-01-13 ENCOUNTER — Emergency Department (HOSPITAL_COMMUNITY): Payer: 59

## 2014-01-13 DIAGNOSIS — R071 Chest pain on breathing: Secondary | ICD-10-CM | POA: Insufficient documentation

## 2014-01-13 DIAGNOSIS — Z87891 Personal history of nicotine dependence: Secondary | ICD-10-CM | POA: Insufficient documentation

## 2014-01-13 DIAGNOSIS — Z9981 Dependence on supplemental oxygen: Secondary | ICD-10-CM | POA: Insufficient documentation

## 2014-01-13 DIAGNOSIS — Z8701 Personal history of pneumonia (recurrent): Secondary | ICD-10-CM | POA: Insufficient documentation

## 2014-01-13 DIAGNOSIS — Z9889 Other specified postprocedural states: Secondary | ICD-10-CM | POA: Insufficient documentation

## 2014-01-13 DIAGNOSIS — R0781 Pleurodynia: Secondary | ICD-10-CM

## 2014-01-13 DIAGNOSIS — Z79899 Other long term (current) drug therapy: Secondary | ICD-10-CM | POA: Insufficient documentation

## 2014-01-13 DIAGNOSIS — Z8719 Personal history of other diseases of the digestive system: Secondary | ICD-10-CM | POA: Insufficient documentation

## 2014-01-13 DIAGNOSIS — IMO0002 Reserved for concepts with insufficient information to code with codable children: Secondary | ICD-10-CM | POA: Insufficient documentation

## 2014-01-13 DIAGNOSIS — Z86718 Personal history of other venous thrombosis and embolism: Secondary | ICD-10-CM | POA: Insufficient documentation

## 2014-01-13 DIAGNOSIS — J441 Chronic obstructive pulmonary disease with (acute) exacerbation: Secondary | ICD-10-CM | POA: Insufficient documentation

## 2014-01-13 DIAGNOSIS — F1021 Alcohol dependence, in remission: Secondary | ICD-10-CM | POA: Insufficient documentation

## 2014-01-13 DIAGNOSIS — Z86711 Personal history of pulmonary embolism: Secondary | ICD-10-CM | POA: Insufficient documentation

## 2014-01-13 DIAGNOSIS — R0602 Shortness of breath: Secondary | ICD-10-CM

## 2014-01-13 DIAGNOSIS — I509 Heart failure, unspecified: Secondary | ICD-10-CM | POA: Insufficient documentation

## 2014-01-13 DIAGNOSIS — G4733 Obstructive sleep apnea (adult) (pediatric): Secondary | ICD-10-CM | POA: Insufficient documentation

## 2014-01-13 LAB — COMPREHENSIVE METABOLIC PANEL
ALBUMIN: 3.6 g/dL (ref 3.5–5.2)
ALT: 34 U/L (ref 0–35)
AST: 33 U/L (ref 0–37)
Alkaline Phosphatase: 76 U/L (ref 39–117)
BUN: 13 mg/dL (ref 6–23)
CALCIUM: 8.5 mg/dL (ref 8.4–10.5)
CO2: 29 mEq/L (ref 19–32)
CREATININE: 0.58 mg/dL (ref 0.50–1.10)
Chloride: 99 mEq/L (ref 96–112)
GFR calc Af Amer: >90 mL/min (ref >90)
GFR calc non Af Amer: >90 mL/min (ref >90)
Glucose, Bld: 86 mg/dL (ref 70–99)
POTASSIUM: 4.5 meq/L (ref 3.7–5.3)
Sodium: 140 mEq/L (ref 137–147)
Total Bilirubin: 0.4 mg/dL (ref 0.3–1.2)
Total Protein: 6.8 g/dL (ref 6.0–8.3)

## 2014-01-13 LAB — CBC WITH DIFFERENTIAL/PLATELET
BASOS PCT: 0 % (ref 0–1)
Basophils Absolute: 0 10*3/uL (ref 0.0–0.1)
Eosinophils Absolute: 0.2 10*3/uL (ref 0.0–0.7)
Eosinophils Relative: 2 % (ref 0–5)
HEMATOCRIT: 42.9 % (ref 36.0–46.0)
Hemoglobin: 14.3 g/dL (ref 12.0–15.0)
Lymphocytes Relative: 16 % (ref 12–46)
Lymphs Abs: 1.6 10*3/uL (ref 0.7–4.0)
MCH: 32.4 pg (ref 26.0–34.0)
MCHC: 33.3 g/dL (ref 30.0–36.0)
MCV: 97.3 fL (ref 78.0–100.0)
Monocytes Absolute: 0.8 10*3/uL (ref 0.1–1.0)
Monocytes Relative: 8 % (ref 3–12)
NEUTROS ABS: 7.5 10*3/uL (ref 1.7–7.7)
Neutrophils Relative %: 74 % (ref 43–77)
Platelets: 172 10*3/uL (ref 150–400)
RBC: 4.41 MIL/uL (ref 3.87–5.11)
RDW: 13.7 % (ref 11.5–15.5)
WBC: 10.1 10*3/uL (ref 4.0–10.5)

## 2014-01-13 LAB — PRO B NATRIURETIC PEPTIDE: Pro B Natriuretic peptide (BNP): 185.2 pg/mL — ABNORMAL HIGH (ref 0–125)

## 2014-01-13 LAB — LIPASE, BLOOD: LIPASE: 12 U/L (ref 11–59)

## 2014-01-13 MED ORDER — METHOCARBAMOL 500 MG PO TABS: 500.0000 mg | ORAL_TABLET | Freq: Two times a day (BID) | ORAL | Status: DC | PRN

## 2014-01-13 MED ORDER — KETOROLAC TROMETHAMINE 30 MG/ML IJ SOLN
30.0000 mg | Freq: Once | INTRAMUSCULAR | Status: AC
  Administered 2014-01-13: 30 mg via INTRAVENOUS
  Filled 2014-01-13: qty 1

## 2014-01-13 NOTE — ED Notes (Signed)
Per patient:  3 weeks ago patient states that she was stretching and felt a twinge in right posterior upper abdomen, [facility]. Has been to primary care since that time and it was thought that she had a torn muscle, and should be improving.  Pain has not improved and she scheduled an appointment with pulmonologist for this coming Monday.  Yesterday she felt a pop and has had increased swelling and pain in her abdomen, along with increased sob.

## 2014-01-13 NOTE — ED Notes (Signed)
Lisa Sanders, PA at bedside.  

## 2014-01-13 NOTE — ED Notes (Signed)
Patient transported to x-ray. ?

## 2014-01-13 NOTE — ED Notes (Signed)
Pt is here with right sided abdominal pain that started 3 weeks ago and even felt something pop.  Pt states increased sob over last 24 hours and is on home O2.  Pt reports that she is having swelling to face and abdomen

## 2014-01-13 NOTE — Discharge Instructions (Signed)
Take prednisone taper at home as directed.  May also start taking robaxin to help with pain. Follow up with Dr. Marchelle Gearingamaswamy as soon as possible for re-check. Return to the ED for new or worsening symptoms.

## 2014-01-13 NOTE — ED Provider Notes (Signed)
CSN: [id number]     Arrival date & time 01/13/14  1125 History   First MD Initiated Contact with Patient 01/13/14 1232     Chief Complaint  Patient presents with  . Abdominal Pain  . Shortness of Breath   (Consider location/radiation/quality/duration/timing/severity/associated sxs/prior Treatment) Patient is a 38 y.o. female presenting with abdominal pain and shortness of breath. The history is provided by the patient and medical records.  Abdominal Pain Associated symptoms: shortness of breath   Shortness of Breath Associated symptoms: abdominal pain    This is a 38 year old female with past medical history significant for COPD, hepatic cirrhosis, CHF, prior PE no longer on anticoagulants, presenting to the ED for right rib pain. Patient states pain has been present for the past 3 weeks, described as a sharp, stabbing pain.  Symptoms worse with coughing, deep breathing, movement, and palpation.  Patient has been evaluated multiple times for this, in the ED and by urgent care without identifiable cause. Patient states last night she felt a "pop" in her right ribs but states pain remains unchanged.  Has been taking pain meds without noted improvement.  Pt also states she feels SOB worse than her baseline.  She is usually on 2L via [place] at home, today she has been on 3L.  SOB worse with exertional activity.  Denies orthopnea.  Admits to 6lb weight gain over the past 3 days-- feels that it is present in her face and epigastric region.  Pt does not follow with cardiology regularly but she does see a pulmonologist, Dr. Marchelle Gearingamaswamy.  Denies fevers, sweats, or chills.  No urinary sx or flank pain.  No hx of renal stones.  Notes chronic dry cough, unchanged from baseline.  Normal PO intake.  No nausea, vomiting, or diarrhea.    Past Medical History  Diagnosis Date  . COPD (chronic obstructive pulmonary disease)     PFTS 03/08/12: fev1 0.58L/1%, FVC 1.18/33%, Ratop 49 and c/w ssevere obstruction. 21% BD response  on FVC, RV 219%, DLCO 11/54%  . History of alcoholism   . Irregular menses   . DVT of upper extremity (deep vein thrombosis) April 2013    right subclavian // Unclear precipitating cause - possibly significant right heart failure, with vascular stasis potentially predisposing to clotting.    . Pulmonary embolus April 2013    Precipitating cause unclear. Was treated with coumadin from April-June 2013.  . Moderate to severe pulmonary hypertension April 2013    Cardiac cath on 03/06/12 - 1. Elevated pulmonary artery pressures, right sided filling pressures.,  2. PA: 64/45 (mean 53)    . Hepatic cirrhosis     Questionable history of - Noted on CT abdomen (03/2012) - thought to be due to vascular congestion from right heart failure +/- patient's history of alcohol abuse  . Axillary adenopathy     right axillary adenopathy noted on CT chest (03/06/2012)  . Periodontal disease   . Chronic right-sided CHF (congestive heart failure) 03/23/2012    with cor pulmonale. Last RHC 03/2012  . Pneumonia ~ 1985; 01/2011  . History of chronic bronchitis   . Exertional shortness of breath   . On home oxygen therapy     "2-3 L 24/7" (05/03/2013)  . OSA (obstructive sleep apnea)     wears noctural BiPAP (05/03/2013)  . Migraine     "~ 1/yr" (05/03/2013)   Past Surgical History  Procedure Laterality Date  . Cardiac surgery  10/16/1977    "my heart was backwards" (  05/03/2013)  . Lung surgery  12/28/1976  . Appendectomy  05/03/2013  . Cardiac catheterization  03/2012  . Laparoscopic appendectomy N/A 05/03/2013    Procedure: APPENDECTOMY LAPAROSCOPIC;  Surgeon: Cherylynn Ridges, MD;  Location: [facility] OR;  Service: General;  Laterality: N/A;   Family History  Problem Relation Age of Onset  . Hypertension Father   . Heart disease Father     CHF; died age 2   . Alcohol abuse Father   . Other Brother     died age 63 y.o overdose   History  Substance Use Topics  . Smoking status: Former Smoker -- 1.00 packs/day for 25  years    Types: Cigarettes    Quit date: 02/18/2012  . Smokeless tobacco: Never Used  . Alcohol Use: No     Comment: previously drinking 1 pint 3-4 days a week for 2002-2013, but in 2014, no longer drinking   OB History   Grav Para Term Preterm Abortions TAB SAB Ect Mult Living                 Review of Systems  Respiratory: Positive for shortness of breath.   Gastrointestinal: Positive for abdominal pain.  Musculoskeletal:       Right rib pain  All other systems reviewed and are negative.    Allergies  Review of patient's allergies indicates no known allergies.  Home Medications   Current Outpatient Rx  Name  Route  Sig  Dispense  Refill  . albuterol (PROVENTIL HFA;VENTOLIN HFA) 108 (90 BASE) MCG/ACT inhaler   Inhalation   Inhale 2 puffs into the lungs every 6 (six) hours as needed for wheezing or shortness of breath.         . Fluticasone-Salmeterol (ADVAIR) 250-50 MCG/DOSE AEPB   Inhalation   Inhale 1 puff into the lungs every 12 (twelve) hours.   60 each   0   . HYDROcodone-acetaminophen (NORCO/VICODIN) 5-325 MG per tablet   Oral   Take 1-2 tablets by mouth every 6 (six) hours as needed.   40 tablet   0   . meloxicam (MOBIC) 15 MG tablet   Oral   Take 1 tablet (15 mg total) by mouth daily.   30 tablet   0   . tiotropium (SPIRIVA) 18 MCG inhalation capsule   Inhalation   Place 1 capsule (18 mcg total) into inhaler and inhale daily.   30 capsule   5    BP 123/82  Pulse 85  Temp(Src) 98 F (36.7 C) (Oral)  Resp 17  Ht 5\' 3"  (1.6 m)  Wt 194 lb (87.998 kg)  BMI 34.37 kg/m2  SpO2 96%  LMP 12/27/2013  Physical Exam  Nursing note and vitals reviewed. Constitutional: She is oriented to person, place, and time. She appears well-developed and well-nourished.  HENT:  Head: Normocephalic and atraumatic.  Mouth/Throat: Oropharynx is clear and moist.  No facial swelling noted  Eyes: Conjunctivae and EOM are normal. Pupils are equal, round, and  reactive to light.  Neck: Normal range of motion.  Cardiovascular: Normal rate, regular rhythm and normal heart sounds.   Pulmonary/Chest: Effort normal and breath sounds normal. No respiratory distress. She has no wheezes.   She exhibits tenderness and bony tenderness. She exhibits no crepitus, no edema, no deformity and no swelling.    Tenderness to palpation over right inferior lateral ribs; no bruising or deformity noted  Abdominal: Soft. Bowel sounds are normal. She exhibits no fluid wave and no ascites. There  is no tenderness. There is no rigidity, no guarding and no CVA tenderness.  Abdomen soft, non-distended, no peritoneal signs; no ascites or fluid wave present  Musculoskeletal: Normal range of motion.  Neurological: She is alert and oriented to person, place, and time.  Skin: Skin is warm and dry.  Psychiatric: She has a normal mood and affect.    ED Course  Procedures (including critical care time) Labs Review Labs Reviewed  PRO B NATRIURETIC PEPTIDE - Abnormal; Notable for the following:    Pro B Natriuretic peptide (BNP) 185.2 (*)    All other components within normal limits  CBC WITH DIFFERENTIAL  COMPREHENSIVE METABOLIC PANEL  LIPASE, BLOOD   Imaging Review No results found.  EKG Interpretation    Date/Time:  Sunday January 13 2014 11:36:06 EST Ventricular Rate:  97 PR Interval:  164 QRS Duration: 74 QT Interval:  356 QTC Calculation: 452 R Axis:   97 Text Interpretation:  Normal sinus rhythm Right atrial enlargement Rightward axis Pulmonary disease pattern Abnormal ECG No significant change since last tracing Confirmed by KNAPP  MD-J, JON ([id number]) on 01/13/2014 12:05:24 PM            MDM   1. Rib pain on right side   2. Shortness of breath    On review of medical records, patient has been worked up for ACS, PE, renal stones, and other sources of her pain with suspected MSK origin.  On exam, her pain is reproducible with palpation to right lower  lateral ribs which is consistent with prior evaluations.    Work-up today again unremarkable. Toradol given for pain which she states has "blunted" the pain but it is still present. Pts oxygen has been reduced back to her normal 2L and she is saturating well without signs of respiratory distress or tachycardia.  At this time, i have low suspicion for ACS, PE, dissection, or other acute cardiac event.  Sx not consistent with renal stones.  Will start on prednisone taper and robaxin to help with sx.  May continue hydrocodone as needed.  FU with pulmonologist this week for re-check.  Discussed plan with pt, she acknowledged understanding and agreed with plan of care.  Strict return precautions advised for new or worsening symptoms.  Garlon Hatchet, PA-C 01/13/14 [phone]

## 2014-01-13 NOTE — ED Provider Notes (Signed)
Medical screening examination/treatment/procedure(s) were performed by non-physician practitioner and as supervising physician I was immediately available for consultation/collaboration.  EKG Interpretation    Date/Time:  Sunday January 13 2014 11:36:06 EST Ventricular Rate:  97 PR Interval:  164 QRS Duration: 74 QT Interval:  356 QTC Calculation: 452 R Axis:   97 Text Interpretation:  Normal sinus rhythm Right atrial enlargement Rightward axis Pulmonary disease pattern Abnormal ECG No significant change since last tracing Confirmed by Lundynn Cohoon  MD-J, Tysen Roesler ([id number]) on 01/13/2014 12:05:24 PM             Celene KrasJon R Danasia Baker, MD 01/13/14 [phone]

## 2014-01-13 NOTE — ED Notes (Signed)
Denies chest pain 

## 2014-01-30 ENCOUNTER — Telehealth: Payer: Self-pay | Admitting: Internal Medicine

## 2014-01-30 NOTE — Telephone Encounter (Signed)
Not seen in a long time. giv fu please

## 2014-02-01 NOTE — Telephone Encounter (Signed)
Pt returned call.  Pt is aware that pt needs appointment. Patient made appt 3/17 Nothing further needed at this time.

## 2014-02-01 NOTE — Telephone Encounter (Signed)
ATC only number in chart , unable to leave a message. Carron CurieJennifer Castillo, CMA

## 2014-02-19 ENCOUNTER — Ambulatory Visit: Payer: 59 | Admitting: Internal Medicine

## 2014-02-22 ENCOUNTER — Encounter: Payer: 59 | Admitting: Cardiology

## 2014-02-22 NOTE — Progress Notes (Signed)
HPI: FU CHF. I initially saw in March of 2013 for evaluation of dyspnea and edema. The patient states she had heart and lung surgery as a child. This was done in [place]. None of those records are available. She does not think she had congenital heart disease. Note she does have a history of COPD. She also has a history of alcoholism. Patient was admitted in April 2013 with right sided facial, right upper extremity and breast swelling. Upper extremity venous Dopplers showed acute right upper extremity DVT. Chest CT showed a thrombosed right subclavian vein. There was no pulmonary embolus. Ascites was noted. Pulmonary reviewed the CT and felt there was a possible small pulmonary embolus in the right middle lobe. Abdominal CT showed ascites, body wall edema and pleural effusions. An echocardiogram in April of 2013 was technically difficult. LV function was normal. There was right atrial and right ventricular enlargement with reduced right ventricular function. Patient had a right-sided cardiac catheterization which revealed right atrial pressure 23, pulmonary artery pressure of 64/45 and pulmonary capillary wedge pressure of 18. Cardiac output was 5.0. Findings were felt secondary to component of severe obstructive sleep apnea, COPD and obesity hypoventilation syndrome. There may also have been a contribution from pulmonary venoocclusive disease. PFTs apparently demonstrated severe obstruction with gas trapping and some degree of reversibility. Note HIV was nonreactive, ANA was negative, rheumatoid factor was less than 10. Anti-scleroderma antibody and centromere antibodies were negative. Sedimentation rate was 2. Patient DCed with coumadin, home O2 and CPAP. Pulmonary function tests in April of 2013 showed FEV1 0.58. Sleep study with sats down to 73% range. Liver ultrasound no cirrhosis. CT scan in May of 2013 low probability. Right-sided failure and pulmonary hypertension felt secondary to COPD.  Followup echocardiogram in March of 2014 showed normal LV function and mild right ventricular enlargement. I last saw her in April 2013. Since then,    Current Outpatient Prescriptions  Medication Sig Dispense Refill  . albuterol (PROVENTIL HFA;VENTOLIN HFA) 108 (90 BASE) MCG/ACT inhaler Inhale 2 puffs into the lungs every 6 (six) hours as needed for wheezing or shortness of breath.      . Fluticasone-Salmeterol (ADVAIR) 250-50 MCG/DOSE AEPB Inhale 1 puff into the lungs every 12 (twelve) hours.  60 each  0  . HYDROcodone-acetaminophen (NORCO/VICODIN) 5-325 MG per tablet Take 1-2 tablets by mouth every 6 (six) hours as needed.  40 tablet  0  . meloxicam (MOBIC) 15 MG tablet Take 1 tablet (15 mg total) by mouth daily.  30 tablet  0  . methocarbamol (ROBAXIN) 500 MG tablet Take 1 tablet (500 mg total) by mouth 2 (two) times daily as needed.  14 tablet  0  . tiotropium (SPIRIVA) 18 MCG inhalation capsule Place 1 capsule (18 mcg total) into inhaler and inhale daily.  30 capsule  5   No current facility-administered medications for this visit.     Past Medical History  Diagnosis Date  . COPD (chronic obstructive pulmonary disease)     PFTS 03/08/12: fev1 0.58L/1%, FVC 1.18/33%, Ratop 49 and c/w ssevere obstruction. 21% BD response on FVC, RV 219%, DLCO 11/54%  . History of alcoholism   . Irregular menses   . DVT of upper extremity (deep vein thrombosis) April 2013    right subclavian // Unclear precipitating cause - possibly significant right heart failure, with vascular stasis potentially predisposing to clotting.    . Pulmonary embolus April 2013    Precipitating cause unclear. Was treated  with coumadin from April-June 2013.  . Moderate to severe pulmonary hypertension April 2013    Cardiac cath on 03/06/12 - 1. Elevated pulmonary artery pressures, right sided filling pressures.,  2. PA: 64/45 (mean 53)    . Hepatic cirrhosis     Questionable history of - Noted on CT abdomen (03/2012) - thought  to be due to vascular congestion from right heart failure +/- patient's history of alcohol abuse  . Axillary adenopathy     right axillary adenopathy noted on CT chest (03/06/2012)  . Periodontal disease   . Chronic right-sided CHF (congestive heart failure) 03/23/2012    with cor pulmonale. Last RHC 03/2012  . Pneumonia ~ 1985; 01/2011  . History of chronic bronchitis   . Exertional shortness of breath   . On home oxygen therapy     "2-3 L 24/7" (05/03/2013)  . OSA (obstructive sleep apnea)     wears noctural BiPAP (05/03/2013)  . Migraine     "~ 1/yr" (05/03/2013)    Past Surgical History  Procedure Laterality Date  . Cardiac surgery  09-30-1977    "my heart was backwards" (05/03/2013)  . Lung surgery  Aug 18, 1977  . Appendectomy  05/03/2013  . Cardiac catheterization  03/2012  . Laparoscopic appendectomy N/A 05/03/2013    Procedure: APPENDECTOMY LAPAROSCOPIC;  Surgeon: Cherylynn Ridges, MD;  Location: [facility] OR;  Service: General;  Laterality: N/A;    History   Social History  . Marital Status: Married    Spouse Name: N/A    Number of Children: 0  . Years of Education: college   Occupational History  . Unemployed     [employer]   Social History Main Topics  . Smoking status: Former Smoker -- 1.00 packs/day for 25 years    Types: Cigarettes    Quit date: 02/18/2012  . Smokeless tobacco: Never Used  . Alcohol Use: No     Comment: previously drinking 1 pint 3-4 days a week for 2002-2013, but in 2014, no longer drinking  . Drug Use: No  . Sexual Activity: Yes    Partners: Male   Other Topics Concern  . 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 Remaining systems are negative.  Physical Exam: Well-developed well-nourished in no acute distress.  Skin is warm and dry.  HEENT is normal.  Neck is supple.  Chest is clear to auscultation with normal expansion.  Cardiovascular exam is regular rate and rhythm.  Abdominal exam nontender or distended. No masses palpated. Extremities show no edema. neuro grossly intact  ECG     This encounter was created in error - please disregard.

## 2014-03-05 ENCOUNTER — Encounter: Payer: Self-pay | Admitting: Cardiology

## 2014-03-21 ENCOUNTER — Ambulatory Visit (INDEPENDENT_AMBULATORY_CARE_PROVIDER_SITE_OTHER): Payer: 59 | Admitting: Emergency Medicine

## 2014-03-21 VITALS — BP 104/72 | HR 80 | Temp 98.1°F | Resp 18 | Ht 62.0 in | Wt 188.0 lb

## 2014-03-21 DIAGNOSIS — R1011 Right upper quadrant pain: Secondary | ICD-10-CM

## 2014-03-21 DIAGNOSIS — G4700 Insomnia, unspecified: Secondary | ICD-10-CM

## 2014-03-21 LAB — COMPREHENSIVE METABOLIC PANEL
ALK PHOS: 61 U/L (ref 39–117)
ALT: 16 U/L (ref 0–35)
AST: 19 U/L (ref 0–37)
Albumin: 4.2 g/dL (ref 3.5–5.2)
BUN: 21 mg/dL (ref 6–23)
CALCIUM: 9.2 mg/dL (ref 8.4–10.5)
CO2: 29 mEq/L (ref 19–32)
CREATININE: 0.65 mg/dL (ref 0.50–1.10)
Chloride: 99 mEq/L (ref 96–112)
GLUCOSE: 84 mg/dL (ref 70–99)
Potassium: 4.5 mEq/L (ref 3.5–5.3)
Sodium: 135 mEq/L (ref 135–145)
Total Bilirubin: 0.6 mg/dL (ref 0.2–1.2)
Total Protein: 7.1 g/dL (ref 6.0–8.3)

## 2014-03-21 LAB — POCT CBC
Granulocyte percent: 71 %G (ref 37–80)
HCT, POC: 46.3 % (ref 37.7–47.9)
Hemoglobin: 14.7 g/dL (ref 12.2–16.2)
LYMPH, POC: 2.3 (ref 0.6–3.4)
MCH: 30.8 pg (ref 27–31.2)
MCHC: 31.7 g/dL — AB (ref 31.8–35.4)
MCV: 97.1 fL — AB (ref 80–97)
MID (CBC): 0.5 (ref 0–0.9)
MPV: 7.9 fL (ref 0–99.8)
POC Granulocyte: 7 — AB (ref 2–6.9)
POC LYMPH %: 23.4 % (ref 10–50)
POC MID %: 5.6 %M (ref 0–12)
Platelet Count, POC: 232 10*3/uL (ref 142–424)
RBC: 4.77 M/uL (ref 4.04–5.48)
RDW, POC: 15.9 %
WBC: 9.8 10*3/uL (ref 4.6–10.2)

## 2014-03-21 LAB — LIPASE: Lipase: 10 U/L (ref 0–75)

## 2014-03-21 LAB — AMYLASE: Amylase: 20 U/L (ref 0–105)

## 2014-03-21 NOTE — Patient Instructions (Signed)

## 2014-03-21 NOTE — Progress Notes (Signed)
[facility] [address], [place] [id number] [phone]  Date:  03/21/2014   Name:  Mary HakimMichelle L Weisenberger   DOB:  11/28/1976   MRN:  [id number]  PCP:  Elvina SidleLAUENSTEIN,KURT, MD    Chief Complaint: Nausea and Insomnia   History of Present Illness:  Mary [name] is a 38 y.o. very pleasant female patient who presents with the following:  History of multiple medical problems.  Oxygen dependent.  Has insomnia over past 4 weeks.  No cough, increase in shortness of breath.  No fever or chills.  Sleeps for 2 hours and wakes up, with difficulty going back and remaining asleep.  Nausea on arising and if eats throws up.  If eats later has no problem.  No stool change.  No food intolerance.  No blood mucous or  Pus in stools.  No fever or chills. No weight loss.  RUQ pain at times.  No improvement with over the counter medications or other home remedies. Denies other complaint or health concern today.   Patient Active Problem List   Diagnosis Date Noted  . Dyspnea 09/18/2013  . Hypokalemia 02/28/2013  . Acute-on-chronic respiratory failure 02/28/2013  . Financial difficulties 02/28/2013  . Obesity hypoventilation syndrome 02/28/2013  . Elevated troponin 02/28/2013  . Myalgia 02/28/2013  . Influenza with respiratory manifestations 02/28/2013  . COPD exacerbation 09/22/2012  . Anovulation 06/30/2012  . Preventative health care 05/05/2012  . Irregular menstrual cycle 04/27/2012  . Chronic right-sided CHF (congestive heart failure) 03/23/2012  . Pulmonary hypertension 03/23/2012  . Cor pulmonale 03/21/2012  . Chronic respiratory failure with hypercapnia 03/21/2012  . OSA on BiPAP 03/19/2012  . Axillary lymphadenopathy 03/06/2012  . Congenital heart disease 01/28/2012  . Obesity 01/28/2012  . Dental caries 01/28/2012  . COPD (chronic obstructive pulmonary disease) 01/26/2012    Past Medical History  Diagnosis Date  . COPD (chronic obstructive pulmonary disease)    PFTS 03/08/12: fev1 0.58L/1%, FVC 1.18/33%, Ratop 49 and c/w ssevere obstruction. 21% BD response on FVC, RV 219%, DLCO 11/54%  . History of alcoholism   . Irregular menses   . DVT of upper extremity (deep vein thrombosis) April 2013    right subclavian // Unclear precipitating cause - possibly significant right heart failure, with vascular stasis potentially predisposing to clotting.    . Pulmonary embolus April 2013    Precipitating cause unclear. Was treated with coumadin from April-June 2013.  . Moderate to severe pulmonary hypertension April 2013    Cardiac cath on 03/06/12 - 1. Elevated pulmonary artery pressures, right sided filling pressures.,  2. PA: 64/45 (mean 53)    . Hepatic cirrhosis     Questionable history of - Noted on CT abdomen (03/2012) - thought to be due to vascular congestion from right heart failure +/- patient's history of alcohol abuse  . Axillary adenopathy     right axillary adenopathy noted on CT chest (03/06/2012)  . Periodontal disease   . Chronic right-sided CHF (congestive heart failure) 03/23/2012    with cor pulmonale. Last RHC 03/2012  . Pneumonia ~ 1985; 01/2011  . History of chronic bronchitis   . Exertional shortness of breath   . On home oxygen therapy     "2-3 L 24/7" (05/03/2013)  . OSA (obstructive sleep apnea)     wears noctural BiPAP (05/03/2013)  . Migraine     "~ 1/yr" (05/03/2013)    Past Surgical History  Procedure Laterality Date  . Cardiac surgery  10/16/1977    "my heart was backwards" (05/03/2013)  . Lung surgery  10/16/1977  . Appendectomy  05/03/2013  . Cardiac catheterization  03/2012  . Laparoscopic appendectomy N/A 05/03/2013    Procedure: APPENDECTOMY LAPAROSCOPIC;  Surgeon: Cherylynn RidgesJames O Wyatt, MD;  [facility];  Service: General;  Laterality: N/A;    History  Substance Use Topics  . Smoking status: Former Smoker -- 1.00 packs/day for 25 years    Types: Cigarettes    Quit date: 02/18/2012  . Smokeless tobacco: Never Used  .  Alcohol Use: No     Comment: previously drinking 1 pint 3-4 days a week for 2002-2013, but in 2014, no longer drinking    Family History  Problem Relation Age of Onset  . Hypertension Father   . Heart disease Father     CHF; died age 37   . Alcohol abuse Father   . Other Brother     died age 37 y.o overdose    No Known Allergies  Medication list has been reviewed and updated.  Current Outpatient Prescriptions on File Prior to Visit  Medication Sig Dispense Refill  . albuterol (PROVENTIL HFA;VENTOLIN HFA) 108 (90 BASE) MCG/ACT inhaler Inhale 2 puffs into the lungs every 6 (six) hours as needed for wheezing or shortness of breath.      . Fluticasone-Salmeterol (ADVAIR) 250-50 MCG/DOSE AEPB Inhale 1 puff into the lungs every 12 (twelve) hours.  60 each  0  . tiotropium (SPIRIVA) 18 MCG inhalation capsule Place 1 capsule (18 mcg total) into inhaler and inhale daily.  30 capsule  5  . HYDROcodone-acetaminophen (NORCO/VICODIN) 5-325 MG per tablet Take 1-2 tablets by mouth every 6 (six) hours as needed.  40 tablet  0  . meloxicam (MOBIC) 15 MG tablet Take 1 tablet (15 mg total) by mouth daily.  30 tablet  0  . methocarbamol (ROBAXIN) 500 MG tablet Take 1 tablet (500 mg total) by mouth 2 (two) times daily as needed.  14 tablet  0   No current facility-administered medications on file prior to visit.    Review of Systems:  As per HPI, otherwise negative.    Physical Examination: Filed Vitals:   03/21/14 1230  BP: 104/72  Pulse: 80  Temp: 98.1 F (36.7 C)  Resp: 18   Filed Vitals:   03/21/14 1230  Height: 5\' 2"  (1.575 m)  Weight: 188 lb (85.276 kg)   Body mass index is 34.38 kg/(m^2). Ideal Body Weight: Weight in (lb) to have BMI = 25: 136.4  GEN: obese, NAD, Non-toxic, A & O x 3  Oxygen dependent. HEENT: Atraumatic, Normocephalic. Neck supple. No masses, No LAD. Ears and Nose: No external deformity. CV: RRR, No M/G/R. No JVD. No thrill. No extra heart sounds. PULM: CTA  B, no wheezes, crackles, rhonchi. No retractions. No resp. distress. No accessory muscle use. ABD: S, NT, ND, +BS. No rebound. No HSM. EXTR: No c/c/e NEURO Normal gait.  PSYCH: Normally interactive. Conversant. Not depressed or anxious appearing.  Calm demeanor.    Assessment and Plan: Insomnia Gall bladder disease Labs sono  Signed,  Phillips OdorJeffery Anderson, MD

## 2014-03-28 ENCOUNTER — Other Ambulatory Visit: Payer: 59

## 2014-08-02 ENCOUNTER — Encounter (HOSPITAL_COMMUNITY): Payer: Self-pay | Admitting: Emergency Medicine

## 2014-08-02 ENCOUNTER — Emergency Department (HOSPITAL_COMMUNITY): Payer: 59

## 2014-08-02 ENCOUNTER — Emergency Department (HOSPITAL_COMMUNITY)
Admission: EM | Admit: 2014-08-02 | Discharge: 2014-08-02 | Disposition: A | Discharge status: Home / Self Care | Payer: 59 | Attending: Emergency Medicine | Admitting: Emergency Medicine

## 2014-08-02 DIAGNOSIS — Z87891 Personal history of nicotine dependence: Secondary | ICD-10-CM | POA: Insufficient documentation

## 2014-08-02 DIAGNOSIS — J449 Chronic obstructive pulmonary disease, unspecified: Secondary | ICD-10-CM | POA: Insufficient documentation

## 2014-08-02 DIAGNOSIS — Z8701 Personal history of pneumonia (recurrent): Secondary | ICD-10-CM | POA: Insufficient documentation

## 2014-08-02 DIAGNOSIS — S39012A Strain of muscle, fascia and tendon of lower back, initial encounter: Secondary | ICD-10-CM

## 2014-08-02 DIAGNOSIS — Z79899 Other long term (current) drug therapy: Secondary | ICD-10-CM | POA: Insufficient documentation

## 2014-08-02 DIAGNOSIS — IMO0002 Reserved for concepts with insufficient information to code with codable children: Secondary | ICD-10-CM | POA: Diagnosis present

## 2014-08-02 DIAGNOSIS — Y92410 Unspecified street and highway as the place of occurrence of the external cause: Secondary | ICD-10-CM | POA: Insufficient documentation

## 2014-08-02 DIAGNOSIS — Z8742 Personal history of other diseases of the female genital tract: Secondary | ICD-10-CM | POA: Insufficient documentation

## 2014-08-02 DIAGNOSIS — Y9389 Activity, other specified: Secondary | ICD-10-CM | POA: Insufficient documentation

## 2014-08-02 DIAGNOSIS — G4733 Obstructive sleep apnea (adult) (pediatric): Secondary | ICD-10-CM | POA: Insufficient documentation

## 2014-08-02 DIAGNOSIS — Z9981 Dependence on supplemental oxygen: Secondary | ICD-10-CM | POA: Diagnosis not present

## 2014-08-02 DIAGNOSIS — I509 Heart failure, unspecified: Secondary | ICD-10-CM | POA: Diagnosis not present

## 2014-08-02 DIAGNOSIS — Z8719 Personal history of other diseases of the digestive system: Secondary | ICD-10-CM | POA: Diagnosis not present

## 2014-08-02 DIAGNOSIS — S335XXA Sprain of ligaments of lumbar spine, initial encounter: Secondary | ICD-10-CM | POA: Insufficient documentation

## 2014-08-02 DIAGNOSIS — Z86711 Personal history of pulmonary embolism: Secondary | ICD-10-CM | POA: Diagnosis not present

## 2014-08-02 DIAGNOSIS — Z86718 Personal history of other venous thrombosis and embolism: Secondary | ICD-10-CM | POA: Insufficient documentation

## 2014-08-02 DIAGNOSIS — J4489 Other specified chronic obstructive pulmonary disease: Secondary | ICD-10-CM | POA: Insufficient documentation

## 2014-08-02 MED ORDER — CYCLOBENZAPRINE HCL 10 MG PO TABS: 10.0000 mg | ORAL_TABLET | Freq: Two times a day (BID) | ORAL | Status: DC | PRN

## 2014-08-02 MED ORDER — NAPROXEN 500 MG PO TABS: 500.0000 mg | ORAL_TABLET | Freq: Two times a day (BID) | ORAL | Status: DC

## 2014-08-02 MED ORDER — HYDROCODONE-ACETAMINOPHEN 5-325 MG PO TABS: 1.0000 | ORAL_TABLET | ORAL | Status: DC | PRN
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
## 2014-08-02 NOTE — ED Provider Notes (Signed)
Medical screening examination/treatment/procedure(s) were performed by non-physician practitioner and as supervising physician I was immediately available for consultation/collaboration.   EKG Interpretation None        Kristen N Ward, DO 08/02/14 2342 

## 2014-08-02 NOTE — ED Notes (Signed)
Knows not to take extra tylenol, and not to drink/drive/operate heavy machinery with prescribed medications. Ambulatory with steady gait. No other questions/concerns.
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
## 2014-08-17 ENCOUNTER — Encounter (HOSPITAL_COMMUNITY): Payer: Self-pay | Admitting: Emergency Medicine

## 2014-08-17 ENCOUNTER — Emergency Department (HOSPITAL_COMMUNITY)
Admission: EM | Admit: 2014-08-17 | Discharge: 2014-08-17 | Disposition: A | Discharge status: Home / Self Care | Payer: 59 | Attending: Emergency Medicine | Admitting: Emergency Medicine

## 2014-08-17 ENCOUNTER — Emergency Department (HOSPITAL_COMMUNITY): Payer: 59

## 2014-08-17 DIAGNOSIS — Z87891 Personal history of nicotine dependence: Secondary | ICD-10-CM | POA: Insufficient documentation

## 2014-08-17 DIAGNOSIS — Z8679 Personal history of other diseases of the circulatory system: Secondary | ICD-10-CM | POA: Diagnosis not present

## 2014-08-17 DIAGNOSIS — J440 Chronic obstructive pulmonary disease with acute lower respiratory infection: Secondary | ICD-10-CM | POA: Diagnosis not present

## 2014-08-17 DIAGNOSIS — I509 Heart failure, unspecified: Secondary | ICD-10-CM | POA: Diagnosis not present

## 2014-08-17 DIAGNOSIS — Z8701 Personal history of pneumonia (recurrent): Secondary | ICD-10-CM | POA: Diagnosis not present

## 2014-08-17 DIAGNOSIS — Z8719 Personal history of other diseases of the digestive system: Secondary | ICD-10-CM | POA: Diagnosis not present

## 2014-08-17 DIAGNOSIS — Z8742 Personal history of other diseases of the female genital tract: Secondary | ICD-10-CM | POA: Diagnosis not present

## 2014-08-17 DIAGNOSIS — Z86711 Personal history of pulmonary embolism: Secondary | ICD-10-CM | POA: Insufficient documentation

## 2014-08-17 DIAGNOSIS — G4733 Obstructive sleep apnea (adult) (pediatric): Secondary | ICD-10-CM | POA: Insufficient documentation

## 2014-08-17 DIAGNOSIS — Z9889 Other specified postprocedural states: Secondary | ICD-10-CM | POA: Diagnosis not present

## 2014-08-17 DIAGNOSIS — J209 Acute bronchitis, unspecified: Secondary | ICD-10-CM | POA: Insufficient documentation

## 2014-08-17 DIAGNOSIS — Z86718 Personal history of other venous thrombosis and embolism: Secondary | ICD-10-CM | POA: Diagnosis not present

## 2014-08-17 DIAGNOSIS — R05 Cough: Secondary | ICD-10-CM | POA: Insufficient documentation

## 2014-08-17 DIAGNOSIS — Z79899 Other long term (current) drug therapy: Secondary | ICD-10-CM | POA: Diagnosis not present

## 2014-08-17 DIAGNOSIS — R059 Cough, unspecified: Secondary | ICD-10-CM | POA: Insufficient documentation

## 2014-08-17 DIAGNOSIS — Z791 Long term (current) use of non-steroidal anti-inflammatories (NSAID): Secondary | ICD-10-CM | POA: Insufficient documentation

## 2014-08-17 DIAGNOSIS — Z9981 Dependence on supplemental oxygen: Secondary | ICD-10-CM | POA: Diagnosis not present

## 2014-08-17 DIAGNOSIS — F1021 Alcohol dependence, in remission: Secondary | ICD-10-CM | POA: Insufficient documentation

## 2014-08-17 LAB — I-STAT TROPONIN, ED: TROPONIN I, POC: 0 ng/mL (ref 0.00–0.08)

## 2014-08-17 LAB — BASIC METABOLIC PANEL
ANION GAP: 11 (ref 5–15)
BUN: 10 mg/dL (ref 6–23)
CALCIUM: 9.4 mg/dL (ref 8.4–10.5)
CO2: 30 meq/L (ref 19–32)
CREATININE: 0.56 mg/dL (ref 0.50–1.10)
Chloride: 97 mEq/L (ref 96–112)
GFR calc Af Amer: >90 mL/min (ref >90)
Glucose, Bld: 102 mg/dL — ABNORMAL HIGH (ref 70–99)
Potassium: 4.7 mEq/L (ref 3.7–5.3)
Sodium: 138 mEq/L (ref 137–147)

## 2014-08-17 LAB — CBC
HCT: 44.3 % (ref 36.0–46.0)
Hemoglobin: 14.9 g/dL (ref 12.0–15.0)
MCH: 32.5 pg (ref 26.0–34.0)
MCHC: 33.6 g/dL (ref 30.0–36.0)
MCV: 96.5 fL (ref 78.0–100.0)
Platelets: 153 10*3/uL (ref 150–400)
RBC: 4.59 MIL/uL (ref 3.87–5.11)
RDW: 13.6 % (ref 11.5–15.5)
WBC: 11.4 10*3/uL — ABNORMAL HIGH (ref 4.0–10.5)

## 2014-08-17 MED ORDER — LEVOFLOXACIN 500 MG PO TABS: 500.0000 mg | ORAL_TABLET | Freq: Every day | ORAL | Status: DC

## 2014-08-17 MED ORDER — PREDNISONE 20 MG PO TABS
60.0000 mg | ORAL_TABLET | Freq: Once | ORAL | Status: AC
  Administered 2014-08-17: 60 mg via ORAL
  Filled 2014-08-17: qty 3

## 2014-08-17 MED ORDER — ALBUTEROL SULFATE (2.5 MG/3ML) 0.083% IN NEBU
5.0000 mg | INHALATION_SOLUTION | Freq: Once | RESPIRATORY_TRACT | Status: AC
  Administered 2014-08-17: 5 mg via RESPIRATORY_TRACT
  Filled 2014-08-17: qty 6

## 2014-08-17 MED ORDER — IPRATROPIUM BROMIDE 0.02 % IN SOLN
0.5000 mg | Freq: Once | RESPIRATORY_TRACT | Status: AC
  Administered 2014-08-17: 0.5 mg via RESPIRATORY_TRACT
  Filled 2014-08-17: qty 2.5

## 2014-08-17 MED ORDER — LEVOFLOXACIN 500 MG PO TABS
500.0000 mg | ORAL_TABLET | Freq: Once | ORAL | Status: AC
  Administered 2014-08-17: 500 mg via ORAL
  Filled 2014-08-17: qty 1

## 2014-08-17 MED ORDER — PREDNISONE 20 MG PO TABS: 60.0000 mg | ORAL_TABLET | Freq: Every day | ORAL | Status: DC

## 2014-08-17 NOTE — Discharge Instructions (Signed)
Take levaquin (antibiotic) and prednisone as prescribed. Use breathing treatments as need. Follow up with your lung specialist in the next week. Return to ER if worse, new symptoms, chest pain, trouble breathing, fevers, other concern.     Chronic Obstructive Pulmonary Disease Chronic obstructive pulmonary disease (COPD) is a common lung condition in which airflow from the lungs is limited. COPD is a general term that can be used to describe many different lung problems that limit airflow, including both chronic bronchitis and emphysema. If you have COPD, your lung function will probably never return to normal, but there are measures you can take to improve lung function and make yourself feel better.  CAUSES   Smoking (common).   Exposure to secondhand smoke.   Genetic problems.  Chronic inflammatory lung diseases or recurrent infections. SYMPTOMS   Shortness of breath, especially with physical activity.   Deep, persistent (chronic) cough with a large amount of thick mucus.   Wheezing.   Rapid breaths (tachypnea).   Gray or bluish discoloration (cyanosis) of the skin, especially in fingers, toes, or lips.   Fatigue.   Weight loss.   Frequent infections or episodes when breathing symptoms become much worse (exacerbations).   Chest tightness. DIAGNOSIS  Your health care provider will take a medical history and perform a physical examination to make the initial diagnosis. Additional tests for COPD may include:   Lung (pulmonary) function tests.  Chest X-ray.  CT scan.  Blood tests. TREATMENT  Treatment available to help you feel better when you have COPD includes:   Inhaler and nebulizer medicines. These help manage the symptoms of COPD and make your breathing more comfortable.  Supplemental oxygen. Supplemental oxygen is only helpful if you have a low oxygen level in your blood.   Exercise and physical activity. These are beneficial for nearly all  people with COPD. Some people may also benefit from a pulmonary rehabilitation program. HOME CARE INSTRUCTIONS   Take all medicines (inhaled or pills) as directed by your health care provider.  Avoid over-the-counter medicines or cough syrups that dry up your airway (such as antihistamines) and slow down the elimination of secretions unless instructed otherwise by your health care provider.   If you are a smoker, the most important thing that you can do is stop smoking. Continuing to smoke will cause further lung damage and breathing trouble. Ask your health care provider for help with quitting smoking. He or she can direct you to community resources or hospitals that provide support.  Avoid exposure to irritants such as smoke, chemicals, and fumes that aggravate your breathing.  Use oxygen therapy and pulmonary rehabilitation if directed by your health care provider. If you require home oxygen therapy, ask your health care provider whether you should purchase a pulse oximeter to measure your oxygen level at home.   Avoid contact with individuals who have a contagious illness.  Avoid extreme temperature and humidity changes.  Eat healthy foods. Eating smaller, more frequent meals and resting before meals may help you maintain your strength.  Stay active, but balance activity with periods of rest. Exercise and physical activity will help you maintain your ability to do things you want to do.  Preventing infection and hospitalization is very important when you have COPD. Make sure to receive all the vaccines your health care provider recommends, especially the pneumococcal and influenza vaccines. Ask your health care provider whether you need a pneumonia vaccine.  Learn and use relaxation techniques to manage stress.  Learn  and use controlled breathing techniques as directed by your health care provider. Controlled breathing techniques include:   Pursed lip breathing. Start by breathing  in (inhaling) through your nose for 1 second. Then, purse your lips as if you were going to whistle and breathe out (exhale) through the pursed lips for 2 seconds.   Diaphragmatic breathing. Start by putting one hand on your abdomen just above your waist. Inhale slowly through your nose. The hand on your abdomen should move out. Then purse your lips and exhale slowly. You should be able to feel the hand on your abdomen moving in as you exhale.   Learn and use controlled coughing to clear mucus from your lungs. Controlled coughing is a series of short, progressive coughs. The steps of controlled coughing are:  1. Lean your head slightly forward.  2. Breathe in deeply using diaphragmatic breathing.  3. Try to hold your breath for 3 seconds.  4. Keep your mouth slightly open while coughing twice.  5. Spit any mucus out into a tissue.  6. Rest and repeat the steps once or twice as needed. SEEK MEDICAL CARE IF:   You are coughing up more mucus than usual.   There is a change in the color or thickness of your mucus.   Your breathing is more labored than usual.   Your breathing is faster than usual.  SEEK IMMEDIATE MEDICAL CARE IF:   You have shortness of breath while you are resting.   You have shortness of breath that prevents you from:  Being able to talk.   Performing your usual physical activities.   You have chest pain lasting longer than 5 minutes.   Your skin color is more cyanotic than usual.  You measure low oxygen saturations for longer than 5 minutes with a pulse oximeter. MAKE SURE YOU:   Understand these instructions.  Will watch your condition.  Will get help right away if you are not doing well or get worse. Document Released: 09/01/2005 Document Revised: 04/08/2014 Document Reviewed: 07/19/2013 [facility] Patient Information 2015 [place], [place]. This information is not intended to replace advice given to you by your health care provider. Make sure  you discuss any questions you have with your health care provider.    Acute Bronchitis Bronchitis is inflammation of the airways that extend from the windpipe into the lungs (bronchi). The inflammation often causes mucus to develop. This leads to a cough, which is the most common symptom of bronchitis.  In acute bronchitis, the condition usually develops suddenly and goes away over time, usually in a couple weeks. Smoking, allergies, and asthma can make bronchitis worse. Repeated episodes of bronchitis may cause further lung problems.  CAUSES Acute bronchitis is most often caused by the same virus that causes a cold. The virus can spread from person to person (contagious) through coughing, sneezing, and touching contaminated objects. SIGNS AND SYMPTOMS   Cough.   Fever.   Coughing up mucus.   Body aches.   Chest congestion.   Chills.   Shortness of breath.   Sore throat.  DIAGNOSIS  Acute bronchitis is usually diagnosed through a physical exam. Your health care provider will also ask you questions about your medical history. Tests, such as chest X-rays, are sometimes done to rule out other conditions.  TREATMENT  Acute bronchitis usually goes away in a couple weeks. Oftentimes, no medical treatment is necessary. Medicines are sometimes given for relief of fever or cough. Antibiotic medicines are usually not needed but  may be prescribed in certain situations. In some cases, an inhaler may be recommended to help reduce shortness of breath and control the cough. A cool mist vaporizer may also be used to help thin bronchial secretions and make it easier to clear the chest.  HOME CARE INSTRUCTIONS  Get plenty of rest.   Drink enough fluids to keep your urine clear or pale yellow (unless you have a medical condition that requires fluid restriction). Increasing fluids may help thin your respiratory secretions (sputum) and reduce chest congestion, and it will prevent dehydration.    Take medicines only as directed by your health care provider.  If you were prescribed an antibiotic medicine, finish it all even if you start to feel better.  Avoid smoking and secondhand smoke. Exposure to cigarette smoke or irritating chemicals will make bronchitis worse. If you are a smoker, consider using nicotine gum or skin patches to help control withdrawal symptoms. Quitting smoking will help your lungs heal faster.   Reduce the chances of another bout of acute bronchitis by washing your hands frequently, avoiding people with cold symptoms, and trying not to touch your hands to your mouth, nose, or eyes.   Keep all follow-up visits as directed by your health care provider.  SEEK MEDICAL CARE IF: Your symptoms do not improve after 1 week of treatment.  SEEK IMMEDIATE MEDICAL CARE IF:  You develop an increased fever or chills.   You have chest pain.   You have severe shortness of breath.  You have bloody sputum.   You develop dehydration.  You faint or repeatedly feel like you are going to pass out.  You develop repeated vomiting.  You develop a severe headache. MAKE SURE YOU:   Understand these instructions.  Will watch your condition.  Will get help right away if you are not doing well or get worse. Document Released: 12/30/2004 Document Revised: 04/08/2014 Document Reviewed: 05/15/2013 [facility] Patient Information 2015 [place], [place]. This information is not intended to replace advice given to you by your health care provider. Make sure you discuss any questions you have with your health care provider.

## 2014-08-17 NOTE — ED Notes (Addendum)
She has hx COPD and CHF. States she was around someone sick 2 days ago and since then shes had a productive cough with green sputum, felt more SOB than normal, and was dizzy this am. She wears 3L [place] prn. She is able to speak in full unlabored sentences in triage.

## 2014-08-17 NOTE — ED Provider Notes (Signed)
CSN: [id number]     Arrival date & time 08/17/14  5638 History   First MD Initiated Contact with Patient 08/17/14 1020     Chief Complaint  Patient presents with  . Cough     (Consider location/radiation/quality/duration/timing/severity/associated sxs/prior Treatment) Patient is a 38 y.o. female presenting with cough. The history is provided by the patient.  Cough Associated symptoms: wheezing   Associated symptoms: no chest pain, no chills, no fever, no headaches, no rash and no sore throat   pt with hx copd, home o2 use, former smoking, c/o productive cough w greenish sputum in the past 1-2 days. No sore throat or runny nose. No fever/chills. No chest pain. Feels breathing at/near baseline, +wheezing. Denies recent change in meds, compliant w normal meds. No leg swelling or pain. No orthopnea or pnd.  Pt notes hx 'bronchitis' numerous times in past, feels same.     Past Medical History  Diagnosis Date  . COPD (chronic obstructive pulmonary disease)     PFTS 03/08/12: fev1 0.58L/1%, FVC 1.18/33%, Ratop 49 and c/w ssevere obstruction. 21% BD response on FVC, RV 219%, DLCO 11/54%  . History of alcoholism   . Irregular menses   . DVT of upper extremity (deep vein thrombosis) April 2013    right subclavian // Unclear precipitating cause - possibly significant right heart failure, with vascular stasis potentially predisposing to clotting.    . Pulmonary embolus April 2013    Precipitating cause unclear. Was treated with coumadin from April-June 2013.  . Moderate to severe pulmonary hypertension April 2013    Cardiac cath on 03/06/12 - 1. Elevated pulmonary artery pressures, right sided filling pressures.,  2. PA: 64/45 (mean 53)    . Hepatic cirrhosis     Questionable history of - Noted on CT abdomen (03/2012) - thought to be due to vascular congestion from right heart failure +/- patient's history of alcohol abuse  . Axillary adenopathy     right axillary adenopathy noted on CT chest  (03/06/2012)  . Periodontal disease   . Chronic right-sided CHF (congestive heart failure) 03/23/2012    with cor pulmonale. Last RHC 03/2012  . Pneumonia ~ 1985; 01/2011  . History of chronic bronchitis   . Exertional shortness of breath   . On home oxygen therapy     "2-3 L 24/7" (05/03/2013)  . OSA (obstructive sleep apnea)     wears noctural BiPAP (05/03/2013)  . Migraine     "~ 1/yr" (05/03/2013)   Past Surgical History  Procedure Laterality Date  . Cardiac surgery  06/06/1977    "my heart was backwards" (05/03/2013)  . Lung surgery  07-16-1977  . Appendectomy  05/03/2013  . Cardiac catheterization  03/2012  . Laparoscopic appendectomy N/A 05/03/2013    Procedure: APPENDECTOMY LAPAROSCOPIC;  Surgeon: Cherylynn Ridges, MD;  Location: [facility] OR;  Service: General;  Laterality: N/A;   Family History  Problem Relation Age of Onset  . Hypertension Father   . Heart disease Father     CHF; died age 74   . Alcohol abuse Father   . Other Brother     died age 62 y.o overdose   History  Substance Use Topics  . Smoking status: Former Smoker -- 1.00 packs/day for 25 years    Types: Cigarettes    Quit date: 02/18/2012  . Smokeless tobacco: Never Used  . Alcohol Use: No     Comment: previously drinking 1 pint 3-4 days a week for 2002-2013, but in  2014, no longer drinking   OB History   Grav Para Term Preterm Abortions TAB SAB Ect Mult Living                 Review of Systems  Constitutional: Negative for fever and chills.  HENT: Negative for sore throat and trouble swallowing.   Eyes: Negative for redness.  Respiratory: Positive for cough and wheezing.   Cardiovascular: Negative for chest pain, palpitations and leg swelling.  Gastrointestinal: Negative for vomiting, abdominal pain and diarrhea.  Genitourinary: Negative for flank pain.  Musculoskeletal: Negative for back pain and neck pain.  Skin: Negative for rash.  Neurological: Negative for headaches.  Hematological: Does not  bruise/bleed easily.  Psychiatric/Behavioral: Negative for confusion.      Allergies  Review of patient's allergies indicates no known allergies.  Home Medications   Prior to Admission medications   Medication Sig Start Date End Date Taking? Authorizing Provider  albuterol (PROVENTIL HFA;VENTOLIN HFA) 108 (90 BASE) MCG/ACT inhaler Inhale 2 puffs into the lungs every 6 (six) hours as needed for wheezing or shortness of breath.    Historical Provider, MD  cyclobenzaprine (FLEXERIL) 10 MG tablet Take 1 tablet (10 mg total) by mouth 2 (two) times daily as needed for muscle spasms. 08/02/14   Tatyana A Kirichenko, PA-C  Fluticasone-Salmeterol (ADVAIR) 250-50 MCG/DOSE AEPB Inhale 1 puff into the lungs every 12 (twelve) hours. 04/26/13   Kalman Shan, MD  HYDROcodone-acetaminophen (NORCO/VICODIN) 5-325 MG per tablet Take 1-2 tablets by mouth every 6 (six) hours as needed. 01/07/14   Tonye Pearson, MD  HYDROcodone-acetaminophen (NORCO/VICODIN) 5-325 MG per tablet Take 1-2 tablets by mouth every 4 (four) hours as needed for moderate pain or severe pain. 08/02/14   Tatyana A Kirichenko, PA-C  meloxicam (MOBIC) 15 MG tablet Take 1 tablet (15 mg total) by mouth daily. 12/31/13   Tonye Pearson, MD  methocarbamol (ROBAXIN) 500 MG tablet Take 1 tablet (500 mg total) by mouth 2 (two) times daily as needed. 01/13/14   Garlon Hatchet, PA-C  naproxen (NAPROSYN) 500 MG tablet Take 1 tablet (500 mg total) by mouth 2 (two) times daily. 08/02/14   Tatyana A Kirichenko, PA-C  tiotropium (SPIRIVA) 18 MCG inhalation capsule Place 1 capsule (18 mcg total) into inhaler and inhale daily. 09/21/12   Kalman Shan, MD   BP 130/80  Pulse 103  Temp(Src) 98.7 F (37.1 C) (Oral)  Resp 18  SpO2 97%  LMP 07/28/2014 Physical Exam  Nursing note and vitals reviewed. Constitutional: She is oriented to person, place, and time. She appears well-developed and well-nourished. No distress.  HENT:  Mouth/Throat:  Oropharynx is clear and moist.  Eyes: Conjunctivae are normal. No scleral icterus.  Neck: Neck supple. No JVD present. No tracheal deviation present.  Cardiovascular: Normal rate, regular rhythm, normal heart sounds and intact distal pulses.   Pulmonary/Chest: Effort normal. No respiratory distress. She has wheezes.  Abdominal: Soft. Normal appearance and bowel sounds are normal. She exhibits no distension. There is no tenderness.  Genitourinary:  No cva tenderness  Musculoskeletal: She exhibits no edema and no tenderness.  Neurological: She is alert and oriented to person, place, and time.  Skin: Skin is warm and dry. No rash noted. She is not diaphoretic.  Psychiatric: She has a normal mood and affect.    ED Course  Procedures (including critical care time) Labs Review   Results for orders placed during the hospital encounter of 08/17/14  BASIC METABOLIC PANEL  Result Value Ref Range   Sodium 138  137 - 147 mEq/L   Potassium 4.7  3.7 - 5.3 mEq/L   Chloride 97  96 - 112 mEq/L   CO2 30  19 - 32 mEq/L   Glucose, Bld 102 (*) 70 - 99 mg/dL   BUN 10  6 - 23 mg/dL   Creatinine, Ser 1.61  0.50 - 1.10 mg/dL   Calcium 9.4  8.4 - 09.6 mg/dL   GFR calc non Af Amer >90  >90 mL/min   GFR calc Af Amer >90  >90 mL/min   Anion gap 11  5 - 15  CBC      Result Value Ref Range   WBC 11.4 (*) 4.0 - 10.5 K/uL   RBC 4.59  3.87 - 5.11 MIL/uL   Hemoglobin 14.9  12.0 - 15.0 g/dL   HCT 04.5  40.9 - 81.1 %   MCV 96.5  78.0 - 100.0 fL   MCH 32.5  26.0 - 34.0 pg   MCHC 33.6  30.0 - 36.0 g/dL   RDW 91.4  78.2 - 95.6 %   Platelets 153  150 - 400 K/uL  I-STAT TROPOININ, ED      Result Value Ref Range   Troponin i, poc 0.00  0.00 - 0.08 ng/mL   Comment 3            Dg Chest 2 View (if Patient Has Fever And/or Copd)  08/17/2014   CLINICAL DATA:  COUGH  EXAM: CHEST - 2 VIEW  COMPARISON:  01/13/2014  FINDINGS: Mild central peribronchial thickening. No confluent airspace infiltrate. Heart size  normal. No effusion. Regional bones unremarkable.  IMPRESSION: Mild central peribronchial thickening suggesting asthma, bronchitis, or viral syndrome.   Electronically Signed   By: Oley Balm M.D.   On: 08/17/2014 12:45        EKG Interpretation   Date/Time:  Saturday August 17 2014 10:12:54 EDT Ventricular Rate:  91 PR Interval:  160 QRS Duration: 74 QT Interval:  354 QTC Calculation: 435 R Axis:   93 Text Interpretation:  Normal sinus rhythm Rightward axis No significant  change since last tracing Confirmed by Javarian Jakubiak  MD, Yusuke Beza ([id number]) on  08/17/2014 10:28:46 AM      MDM   o2 [place]. Cxr.  Albuterol and atrovent neb.  Persistent wheezing.  Additional neb treatments. pred po.  Reviewed nursing notes and prior charts for additional history.   Recheck improved air exchange. Mild wheezing.  Alb and atrovent neb.  levaquin po.  Recheck pt breathing comfortably, drinking fluids. No increased wob.  Pt appears stable for d/c.       Suzi Roots, MD 08/17/14 1331

## 2014-09-06 ENCOUNTER — Telehealth: Payer: Self-pay | Admitting: Internal Medicine

## 2014-09-06 MED ORDER — ALBUTEROL SULFATE HFA 108 (90 BASE) MCG/ACT IN AERS: 2.0000 | INHALATION_SPRAY | Freq: Four times a day (QID) | RESPIRATORY_TRACT | Status: DC | PRN

## 2014-09-06 NOTE — Telephone Encounter (Signed)
Called and spoke with pt and she is aware of refill that has been sent to the pharmacy.  She is aware to keep her appt with MR this month.

## 2014-09-10 ENCOUNTER — Encounter: Payer: Self-pay | Admitting: *Deleted

## 2014-09-10 ENCOUNTER — Ambulatory Visit (INDEPENDENT_AMBULATORY_CARE_PROVIDER_SITE_OTHER): Payer: 59 | Admitting: Cardiology

## 2014-09-10 ENCOUNTER — Encounter: Payer: Self-pay | Admitting: Cardiology

## 2014-09-10 VITALS — BP 122/84 | HR 96 | Ht 62.5 in | Wt 196.3 lb

## 2014-09-10 DIAGNOSIS — I270 Primary pulmonary hypertension: Secondary | ICD-10-CM

## 2014-09-10 DIAGNOSIS — R1900 Intra-abdominal and pelvic swelling, mass and lump, unspecified site: Secondary | ICD-10-CM

## 2014-09-10 DIAGNOSIS — I50812 Chronic right heart failure: Secondary | ICD-10-CM

## 2014-09-10 DIAGNOSIS — I509 Heart failure, unspecified: Secondary | ICD-10-CM

## 2014-09-10 DIAGNOSIS — J438 Other emphysema: Secondary | ICD-10-CM

## 2014-09-10 DIAGNOSIS — I272 Pulmonary hypertension, unspecified: Secondary | ICD-10-CM

## 2014-09-10 MED ORDER — TORSEMIDE 20 MG PO TABS: 20.0000 mg | ORAL_TABLET | Freq: Every day | ORAL | Status: DC

## 2014-09-10 NOTE — Assessment & Plan Note (Signed)
Patient has a history of pulmonary hypertension and chronic right-sided congestive heart failure felt secondary to COPD. She is volume overloaded today. She has increased ascites and her weight is increased. Resume Demadex 20 mg daily. Repeat echocardiogram. Check potassium, renal function in one week. Question if there is more of a component of cirrhosis on exam; recheck abdominal ultrasound. Check liver functions, albumin and PT. May need referral back to CHF clinic.

## 2014-09-10 NOTE — Assessment & Plan Note (Signed)
Felt secondary predominantly to obstructive sleep apnea and COPD. Continue home oxygen and BiPAP.

## 2014-09-10 NOTE — Patient Instructions (Signed)
Your physician recommends that you schedule a follow-up appointment in: 4 WEEKS WITH DR Jens SomRENSHAW  Your physician has requested that you have an echocardiogram. Echocardiography is a painless test that uses sound waves to create images of your heart. It provides your doctor with information about the size and shape of your heart and how well your heart's chambers and valves are working. This procedure takes approximately one hour. There are no restrictions for this procedure.   START TORSEMIDE 20 MG ONCE DAILY  Your physician recommends that you return for lab work in: ONE WEEK  ABDOMINAL US FOR ABDOMINAL SWELLING

## 2014-09-10 NOTE — Assessment & Plan Note (Signed)
Followed by pulmonary 

## 2014-09-10 NOTE — Progress Notes (Signed)
HPI: FU right side CHF and pulmonary HTN. The patient states she had heart and lung surgery as a child. This was done in [place]. None of those records are available. She does not think she had congenital heart disease. Note she does have a history of COPD. She also has a history of alcoholism. Patient was admitted 4/13 with right sided facial, right upper extremity and breast swelling. Upper extremity venous Dopplers showed acute right upper extremity DVT. Chest CT showed a thrombosed right subclavian vein. There was no pulmonary embolus. Ascites was noted. Pulmonary reviewed the CT and felt there was a possible small pulmonary embolus in the right middle lobe. Abdominal CT showed ascites, body wall edema and pleural effusions. An echocardiogram in April of 2013 was technically difficult. LV function was normal. There was right atrial and right ventricular enlargement with reduced right ventricular function. Patient had a right-sided cardiac catheterization which revealed right atrial pressure 23, pulmonary artery pressure of 64/45 and pulmonary capillary wedge pressure of 18. Cardiac output was 5.0. Findings were felt secondary to component of severe obstructive sleep apnea, COPD and obesity hypoventilation syndrome. There may also have been a contribution from pulmonary venoocclusive disease. PFTs apparently demonstrated severe obstruction with gas trapping and some degree of reversibility. Note HIV was nonreactive, ANA was negative, rheumatoid factor was less than 10. Anti-scleroderma antibody and centromere antibodies were negative. Sedimentation rate was 2. Patient DCed with coumadin, home O2 and CPAP. VQ 5/13 low prob. Abd ultrasound 5/13 no cirrhosis. Echo 3/14-normal LV function; RVE. Pulm HTN felt related to COPD. Not seen since 4/13. Patient has some dyspnea on exertion. She is on home oxygen and BiPAP at night. In the past several weeks she has noticed increasing abdominal swelling and  weight gain. There is no pedal edema. No chest pain, palpitations or syncope. Patient has not been seen since 2013.   Current Outpatient Prescriptions  Medication Sig Dispense Refill  . albuterol (PROVENTIL HFA;VENTOLIN HFA) 108 (90 BASE) MCG/ACT inhaler Inhale 2 puffs into the lungs every 6 (six) hours as needed for wheezing or shortness of breath.  8.5 g  1  . Fluticasone-Salmeterol (ADVAIR) 250-50 MCG/DOSE AEPB Inhale 1 puff into the lungs every 12 (twelve) hours.  60 each  0  . tiotropium (SPIRIVA) 18 MCG inhalation capsule Place 1 capsule (18 mcg total) into inhaler and inhale daily.  30 capsule  5   No current facility-administered medications for this visit.     Past Medical History  Diagnosis Date  . COPD (chronic obstructive pulmonary disease)     PFTS 03/08/12: fev1 0.58L/1%, FVC 1.18/33%, Ratop 49 and c/w ssevere obstruction. 21% BD response on FVC, RV 219%, DLCO 11/54%  . History of alcoholism   . Irregular menses   . DVT of upper extremity (deep vein thrombosis) April 2013    right subclavian // Unclear precipitating cause - possibly significant right heart failure, with vascular stasis potentially predisposing to clotting.    . Pulmonary embolus April 2013    Precipitating cause unclear. Was treated with coumadin from April-June 2013.  . Moderate to severe pulmonary hypertension April 2013    Cardiac cath on 03/06/12 - 1. Elevated pulmonary artery pressures, right sided filling pressures.,  2. PA: 64/45 (mean 53)    . Hepatic cirrhosis     Questionable history of - Noted on CT abdomen (03/2012) - thought to be due to vascular congestion from right heart failure +/- patient's history of alcohol  abuse  . Axillary adenopathy     right axillary adenopathy noted on CT chest (03/06/2012)  . Periodontal disease   . Chronic right-sided CHF (congestive heart failure) 03/23/2012    with cor pulmonale. Last RHC 03/2012  . Pneumonia ~ 1985; 01/2011  . History of chronic bronchitis   .  Exertional shortness of breath   . On home oxygen therapy     "2-3 L 24/7" (05/03/2013)  . OSA (obstructive sleep apnea)     wears noctural BiPAP (05/03/2013)  . Migraine     "~ 1/yr" (05/03/2013)    Past Surgical History  Procedure Laterality Date  . Cardiac surgery  1977/01/05    "my heart was backwards" (05/03/2013)  . Lung surgery  1977-10[birth year]05  . Appendectomy  05/03/2013  . Cardiac catheterization  03/2012  . Laparoscopic appendectomy N/A 05/03/2013    Procedure: APPENDECTOMY LAPAROSCOPIC;  Surgeon: Cherylynn Ridges, MD;  Location[facility];  Service: General;  Laterality: N/A;    History   Social History  . Marital Status: Married    Spouse Name: N/A    Number of Children: 0  . Years of Education: college   Occupational History  . Unemployed     [employer]   Social History Main Topics  . Smoking status: Former Smoker -- 1.00 packs/day for 25 years    Types: Cigarettes    Quit date: 02/18/2012  . Smokeless tobacco: Never Used  . Alcohol Use: No     Comment: previously drinking 1 pint 3-4 days a week for 2002-2013, but in 2014, no longer drinking  . Drug Use: No  . Sexual Activity: Yes    Partners: Male   Other Topics Concern  . 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 Remaining systems are negative.  Physical Exam: Well-developed chronically ill appearing in no acute distress.  Skin is warm and dry.  HEENT is normal.  Neck is supple.  Chest is clear to auscultation with normal expansion.  Cardiovascular exam is regular rate and rhythm.  Abdominal exam  distended; positive fluid wave Extremities show no edema. neuro grossly intact  ECG 08/17/2014-sinus rhythm, right axis deviation.

## 2014-09-12 ENCOUNTER — Ambulatory Visit (HOSPITAL_COMMUNITY)
Admission: RE | Admit: 2014-09-12 | Discharge: 2014-09-12 | Disposition: A | Discharge status: Home / Self Care | Payer: 59 | Source: Ambulatory Visit | Attending: Cardiology | Admitting: Cardiology

## 2014-09-12 DIAGNOSIS — I50812 Chronic right heart failure: Secondary | ICD-10-CM

## 2014-09-12 DIAGNOSIS — I509 Heart failure, unspecified: Secondary | ICD-10-CM | POA: Insufficient documentation

## 2014-09-12 NOTE — Progress Notes (Signed)
2D Echo Performed 09/12/2014    Ewell Benassi, RCS  

## 2014-09-16 ENCOUNTER — Ambulatory Visit (HOSPITAL_COMMUNITY): Payer: 59

## 2014-09-19 ENCOUNTER — Ambulatory Visit: Payer: 59 | Admitting: Internal Medicine

## 2014-09-20 ENCOUNTER — Ambulatory Visit (HOSPITAL_COMMUNITY): Payer: 59

## 2014-09-26 ENCOUNTER — Ambulatory Visit (HOSPITAL_COMMUNITY): Admission: RE | Admit: 2014-09-26 | Payer: 59 | Source: Ambulatory Visit

## 2014-09-27 ENCOUNTER — Encounter: Payer: Self-pay | Admitting: Internal Medicine

## 2014-09-27 ENCOUNTER — Other Ambulatory Visit (INDEPENDENT_AMBULATORY_CARE_PROVIDER_SITE_OTHER): Payer: 59

## 2014-09-27 ENCOUNTER — Ambulatory Visit (INDEPENDENT_AMBULATORY_CARE_PROVIDER_SITE_OTHER): Payer: 59 | Admitting: Internal Medicine

## 2014-09-27 VITALS — BP 112/80 | HR 84 | Ht 62.5 in | Wt 201.0 lb

## 2014-09-27 DIAGNOSIS — Z23 Encounter for immunization: Secondary | ICD-10-CM

## 2014-09-27 DIAGNOSIS — J438 Other emphysema: Secondary | ICD-10-CM

## 2014-09-27 LAB — CBC WITH DIFFERENTIAL/PLATELET
BASOS ABS: 0 10*3/uL (ref 0.0–0.1)
Basophils Relative: 0.3 % (ref 0.0–3.0)
Eosinophils Absolute: 0.2 10*3/uL (ref 0.0–0.7)
Eosinophils Relative: 1.4 % (ref 0.0–5.0)
HCT: 44.2 % (ref 36.0–46.0)
Hemoglobin: 14.2 g/dL (ref 12.0–15.0)
LYMPHS ABS: 2 10*3/uL (ref 0.7–4.0)
Lymphocytes Relative: 17.8 % (ref 12.0–46.0)
MCHC: 32.2 g/dL (ref 30.0–36.0)
MCV: 96.7 fl (ref 78.0–100.0)
Monocytes Absolute: 0.9 10*3/uL (ref 0.1–1.0)
Monocytes Relative: 8.5 % (ref 3.0–12.0)
Neutro Abs: 8 10*3/uL — ABNORMAL HIGH (ref 1.4–7.7)
Neutrophils Relative %: 72 % (ref 43.0–77.0)
PLATELETS: 178 10*3/uL (ref 150.0–400.0)
RBC: 4.57 Mil/uL (ref 3.87–5.11)
RDW: 14 % (ref 11.5–15.5)
WBC: 11.1 10*3/uL — ABNORMAL HIGH (ref 4.0–10.5)

## 2014-09-27 MED ORDER — TIOTROPIUM BROMIDE MONOHYDRATE 18 MCG IN CAPS: 18.0000 ug | ORAL_CAPSULE | Freq: Every day | RESPIRATORY_TRACT | Status: DC

## 2014-09-27 MED ORDER — FLUTICASONE-SALMETEROL 250-50 MCG/DOSE IN AEPB: 1.0000 | INHALATION_SPRAY | Freq: Two times a day (BID) | RESPIRATORY_TRACT | Status: DC

## 2014-09-27 NOTE — Patient Instructions (Addendum)
#  COPD  - - continue oxygen   - make sure pulse ox is > 88% all the time  - continue advair 100/50 or two times daily -  - continue spiriva 1 puff daily - flu shot 09/27/2014 - do CBC with differeential to see eosinophils contribute to recurrent flareups - will refer to resarch coordinator for copd trial evaluation   #FOllowup  - 3 months or sooner if needed - CAT score and spirometry at fu - if falling sick or unwell let me know

## 2014-09-27 NOTE — Progress Notes (Signed)
Subjective:    Patient ID: Mary [name], female    DOB: 03/17/1976, 38 y.o.   MRN: [id number]  HPI  #Obesity  - Body mass index is 32.07 kg/(m^2). on Nov 2013 - Body mass index is 32.95 kg/(m^2). on 06/04/2013    #Ex-Smoker  - age 38 to 38; 25 pack year smoking hx  # Hx of Congenital Heart Surgery NOS   - parents deceased. No idea what surgery she had  #COPD - Alpha 1 is MM - checked May 2013. per hx her dad had copd.  - PFTS 03/08/12: fev1 0.58L/1%, FVC 1.18/33%, Ratop 49 and c/w ssevere obstruction. 21% BD response on FVC, RV 219%, DLCO 11/54% - PFT 04/10/12: fev1 0.54L/18%, 31% BD response, RATio 49, TL 88%, RV 204$, DLCO 30%,  - Abg baseline MAy 2013: 7.43/50/81 - Rx ? On nocturnal cpap/bipap  #Pulmonary Hypertension/Cor Pulmonale - -  - CT chest 03/06/12  IMPRESSION:  Thrombosed right subclavian vein.  Marked right chest wall edema involving the right breast and right  axilla. Right axillary adenopathy.  No pulmonary embolus (probable motion artifact involving the right  lower lobe pulmonary vessels).  Mediastinal and right hilar adenopathy.  Scattered pulmonary parenchymal changes without dominant worrisome  mass. Patchy parenchymal changes most notable right upper lobe.  This can be reevaluated follow-up after acute episode has cleared.  Small right-sided pleural effusion. Right heart enlargement. Small  superiorly located pericardial effusion.  Ascites. Slight lobularity liver without other findings of  cirrhosis. Mild third spacing of fluid abdominal wall.  Original Report Authenticated By: Fuller CanadaSTEVEN R. OLSON, M.   ECHO 03/06/12:  - Left ventricle: Systolic function was normal. The estimated ejection fraction was in the range of 55% to 60%. - Ventricular septum: Septal motion showed paradox. - Right ventricle: The cavity size was moderately dilated. Systolic function was reduced. - Right atrium: The atrium was moderately dilated   ECHO 5/813 - Normal LV but  RV severely dilated  Right Heart cath 03/07/12  Hemodynamic Findings:  Ao: (cuff) 111/84  LV: not performed  RA: 23  RV: 59/12/25  PA: 64/45 (mean 53)  PCWP: 18  Fick Cardiac Output: 5.0 L/min  Fick Cardiac Index: 2.76 L/min/m2  Peripheral Aortic Saturation: 90%  Pulmonary Artery Saturation: 67%    HIV 03/22/12   - NEGATIVE  AUTOIMMUNE  - alpha 1 03/07/12 - (done during illness) - 342  - ANA, RF, scl 70 - April 2013 - NEGATIVE   VQ scan 04/12/12  - low prob for PE  US Abd 04/07/12  - normal with spleen at upper limit of normal at 12cm      # RUE DVT  - Duple UE 03/06/12: acute deep vein thrombosis involving the right upper extremity of the right subclaviian vein. Rx  Coumadin stopped July 2013 by a Dr Michaell CowingGross per her hx   #AECOPD  - several admits early 2013 including icu - May 2013  -opd RX - Oct 2013 - opd Rx  # ? Sleep Apnea  - SLEEP STUDY 03/29/12  Dr Maple HudsonYoung   IMPRESSIONS-RECOMMENDATION:  1. Sleep architecture shows predominance of stage II sleep interrupted  mostly by rapid eye movement in a pattern that suggests sedating  medication during the sleep period, although the none is listed.  2. Occasional respiratory events with sleep disturbance, within normal  limits. Apnea-hypopnea index 4.6 per hour (the normal range for  adults is from 0-5 events per hour). Moderate snoring with oxygen  desaturation to  a nadir of 73% and mean oxygen saturation through  the study of 93% wearing supplemental oxygen.  3. The patient wore nasal oxygen at 4 L per minute throughout the  study as ordered. On arrival while upright, saturation was 97%.  During this study, she desaturated to a nadir of 73% with a mean  throughout the study of 93% saturation while wearing oxygen at 4 L  per minute by nasal prongs. During the total recording time,  oxygen saturation was less than 90% for 51.3 minutes and less than  88% for 38.9 minutes while wearing supplemental oxygen.     #Difficult  Social Situation  - Oct 2013:  husband in and out of MD visits and hospitalizations: diagnosed as mass in testicular wall and polyps; all bengin but mass effect. But now back to work and lot better. Financial situation - better. Says she has gas $ to make it to doc appointments. Unemployed after copd diagnosis. Unable to get disability due to husband job (he earns too much for SSI,). She is not able to get SSD due to lack of work credits   OV 09/27/2014  Chief Complaint  Patient presents with  . Follow-up    Pt states her breathing is unchanged. Pt wearing Bipap nightly and denies issues. Pt states she only has SOB with over exertion, pt also c/o mild cough. Pt denies Cp/tightness.    Followup COPD with chronic respiratory failure. Difficult social situation and therefore not a transplant candidate  - I last saw her in June 2014. Since the overall stable but apparently 6 weeks ago had a COPD exacerbation was treated at an urgent care with antibiotics and prednisone. Currently reporting stable COPD health. COPD cat score 19 and reflects moderate copd symptom  but stable COPD. She has not had a flu shot but does agree to have it today. She is on Spiriva and Advair uses it regularly but when she runs out she has trouble replacing it. Currently not on Advair. Discussed copd research trials: she is very interested    CAT COPD Symptom & Quality of Life Score (GSK trademark) 0 is no burden. 5 is highest burden 09/27/2014   Never Cough -> Cough all the time 3  No phlegm in chest -> Chest is full of phlegm 3  No chest tightness -> Chest feels very tight 2  No dyspnea for 1 flight stairs/hill -> Very dyspneic for 1 flight of stairs 5  No limitations for ADL at home -> Very limited with ADL at home 2  Confident leaving home -> Not at all confident leaving home 0  Sleep soundly -> Do not sleep soundly because of lung condition 1  Lots of Energy -> No energy at all 3  TOTAL Score (max 40)  19        Review of Systems  Constitutional: Negative for fever and unexpected weight change.  HENT: Negative for congestion, dental problem, ear pain, nosebleeds, postnasal drip, rhinorrhea, sinus pressure, sneezing, sore throat and trouble swallowing.   Eyes: Negative for redness and itching.  Respiratory: Positive for cough and shortness of breath. Negative for chest tightness and wheezing.   Cardiovascular: Negative for palpitations and leg swelling.  Gastrointestinal: Negative for nausea and vomiting.  Genitourinary: Negative for dysuria.  Musculoskeletal: Negative for joint swelling.  Skin: Negative for rash.  Neurological: Negative for headaches.  Hematological: Does not bruise/bleed easily.  Psychiatric/Behavioral: Negative for dysphoric mood. The patient is not nervous/anxious.  Current outpatient prescriptions:albuterol (PROVENTIL HFA;VENTOLIN HFA) 108 (90 BASE) MCG/ACT inhaler, Inhale 2 puffs into the lungs every 6 (six) hours as needed for wheezing or shortness of breath., Disp: 8.5 g, Rfl: 1;  Fluticasone-Salmeterol (ADVAIR) 250-50 MCG/DOSE AEPB, Inhale 1 puff into the lungs every 12 (twelve) hours., Disp: 60 each, Rfl: 0 torsemide (DEMADEX) 20 MG tablet, Take 1 tablet (20 mg total) by mouth daily., Disp: 90 tablet, Rfl: 3;  tiotropium (SPIRIVA) 18 MCG inhalation capsule, Place 1 capsule (18 mcg total) into inhaler and inhale daily., Disp: 30 capsule, Rfl: 5  Objective:   Physical Exam  Filed Vitals:   09/27/14 1453  BP: 112/80  Pulse: 84  Height: 5' 2.5" (1.588 m)  Weight: 201 lb (91.173 kg)  SpO2: 97%   ysical Exam Vitals reviewed. Constitutional: She is oriented to person, place, and time. She appears well-developed and well-nourished. No distress.        Body mass index is 36.15 kg/(m^2).     HENT:  Head: Normocephalic and atraumatic.  Right Ear: External ear normal.  Left Ear: External ear normal.  Mouth/Throat: Oropharynx is clear and moist. No  oropharyngeal exudate.       Nasal cannula + Caries +  Eyes: Conjunctivae normal and EOM are normal. Pupils are equal, round, and reactive to light. Right eye exhibits no discharge. Left eye exhibits no discharge. No scleral icterus.  Neck: Normal range of motion. Neck supple. No JVD present. No tracheal deviation present. No thyromegaly present.  Cardiovascular: Normal rate, regular rhythm, normal heart sounds and intact distal pulses.  Exam reveals no gallop and no friction rub.   No murmur heard. Pulmonary/Chest: Effort normal and breath sounds normal. No respiratory distress. She has no wheezes. She has no rales. She exhibits no tenderness.       Overall diminshed air entry  Abdominal: Soft. Bowel sounds are normal. She exhibits no distension and no mass. There is no tenderness. There is no rebound and no guarding.  Musculoskeletal: Normal range of motion. She exhibits no edema and no tenderness.  Lymphadenopathy:    She has no cervical adenopathy.  Neurological: She is alert and oriented to person, place, and time. She has normal reflexes. No cranial nerve deficit. She exhibits normal muscle tone. Coordination normal.  Skin: Skin is warm and dry. No rash noted. She is not diaphoretic. No erythema. No pallor.  Psychiatric: She has a normal mood and affect. Her behavior is normal. Judgment and thought content normal.  cheerful        Assessment & Plan:  #COPD  - - continue oxygen   - make sure pulse ox is > 88% all the time  - continue advair 100/50 or two times daily -  - continue spiriva 1 puff daily - flu shot 09/27/2014 - do CBC with differeential to see eosinophils contribute to recurrent flareups - will refer to resarch coordinator for copd trial evaluation   #FOllowup  - 3 months or sooner if needed - CAT score and spirometry at fu - if falling sick or unwell let me know      Dr. Kalman ShanMurali Cason Dabney, M.D., [facility]C.P Pulmonary and Critical Care Medicine Staff  Physician [facility] Pager: [phone], If no answer or between  15:00h - 7:00h: call [phone]  09/27/2014 3:43 PM

## 2014-09-27 NOTE — Addendum Note (Signed)
Addended by: Nicanor AlconNAGLE, Bliss Tsang K on: 09/27/2014 03:54 PM   Modules accepted: Orders

## 2014-09-30 ENCOUNTER — Ambulatory Visit (HOSPITAL_COMMUNITY)
Admission: RE | Admit: 2014-09-30 | Discharge: 2014-09-30 | Disposition: A | Discharge status: Home / Self Care | Payer: 59 | Source: Ambulatory Visit | Attending: Cardiology | Admitting: Cardiology

## 2014-09-30 DIAGNOSIS — R1900 Intra-abdominal and pelvic swelling, mass and lump, unspecified site: Secondary | ICD-10-CM

## 2014-10-01 NOTE — Progress Notes (Signed)
Quick Note:  Called and spoke to pt. Informed pt of the results per MR. Pt verbalized understanding and denied any further questions or concerns at this time. ______ 

## 2014-11-14 ENCOUNTER — Encounter (HOSPITAL_COMMUNITY): Payer: Self-pay | Admitting: Cardiovascular Disease

## 2014-12-06 ENCOUNTER — Other Ambulatory Visit: Payer: Self-pay | Admitting: Internal Medicine

## 2014-12-10 ENCOUNTER — Emergency Department (HOSPITAL_COMMUNITY): Payer: 59

## 2014-12-10 ENCOUNTER — Encounter (HOSPITAL_COMMUNITY): Payer: Self-pay

## 2014-12-10 ENCOUNTER — Emergency Department (HOSPITAL_COMMUNITY)
Admission: EM | Admit: 2014-12-10 | Discharge: 2014-12-11 | Disposition: A | Discharge status: Home / Self Care | Payer: 59 | Attending: Emergency Medicine | Admitting: Emergency Medicine

## 2014-12-10 DIAGNOSIS — G4733 Obstructive sleep apnea (adult) (pediatric): Secondary | ICD-10-CM | POA: Diagnosis not present

## 2014-12-10 DIAGNOSIS — Z3202 Encounter for pregnancy test, result negative: Secondary | ICD-10-CM | POA: Insufficient documentation

## 2014-12-10 DIAGNOSIS — Z8742 Personal history of other diseases of the female genital tract: Secondary | ICD-10-CM | POA: Insufficient documentation

## 2014-12-10 DIAGNOSIS — J449 Chronic obstructive pulmonary disease, unspecified: Secondary | ICD-10-CM | POA: Diagnosis not present

## 2014-12-10 DIAGNOSIS — Z8701 Personal history of pneumonia (recurrent): Secondary | ICD-10-CM | POA: Insufficient documentation

## 2014-12-10 DIAGNOSIS — Z86711 Personal history of pulmonary embolism: Secondary | ICD-10-CM | POA: Insufficient documentation

## 2014-12-10 DIAGNOSIS — Z7951 Long term (current) use of inhaled steroids: Secondary | ICD-10-CM | POA: Insufficient documentation

## 2014-12-10 DIAGNOSIS — Z9889 Other specified postprocedural states: Secondary | ICD-10-CM | POA: Diagnosis not present

## 2014-12-10 DIAGNOSIS — Z86718 Personal history of other venous thrombosis and embolism: Secondary | ICD-10-CM | POA: Insufficient documentation

## 2014-12-10 DIAGNOSIS — Z87891 Personal history of nicotine dependence: Secondary | ICD-10-CM | POA: Diagnosis not present

## 2014-12-10 DIAGNOSIS — Z9981 Dependence on supplemental oxygen: Secondary | ICD-10-CM | POA: Diagnosis not present

## 2014-12-10 DIAGNOSIS — I270 Primary pulmonary hypertension: Secondary | ICD-10-CM | POA: Diagnosis not present

## 2014-12-10 DIAGNOSIS — R110 Nausea: Secondary | ICD-10-CM | POA: Diagnosis not present

## 2014-12-10 DIAGNOSIS — I509 Heart failure, unspecified: Secondary | ICD-10-CM | POA: Insufficient documentation

## 2014-12-10 DIAGNOSIS — Z8719 Personal history of other diseases of the digestive system: Secondary | ICD-10-CM | POA: Diagnosis not present

## 2014-12-10 DIAGNOSIS — R1013 Epigastric pain: Secondary | ICD-10-CM | POA: Insufficient documentation

## 2014-12-10 DIAGNOSIS — R161 Splenomegaly, not elsewhere classified: Secondary | ICD-10-CM | POA: Insufficient documentation

## 2014-12-10 LAB — LIPASE, BLOOD: Lipase: 28 U/L (ref 11–59)

## 2014-12-10 LAB — URINALYSIS, ROUTINE W REFLEX MICROSCOPIC
Glucose, UA: NEGATIVE mg/dL
HGB URINE DIPSTICK: NEGATIVE
Ketones, ur: NEGATIVE mg/dL
Leukocytes, UA: NEGATIVE
Nitrite: NEGATIVE
PROTEIN: NEGATIVE mg/dL
SPECIFIC GRAVITY, URINE: 1.027 (ref 1.005–1.030)
Urobilinogen, UA: 1 mg/dL (ref 0.0–1.0)
pH: 7.5 (ref 5.0–8.0)

## 2014-12-10 LAB — COMPREHENSIVE METABOLIC PANEL
ALT: 66 U/L — AB (ref 0–35)
AST: 75 U/L — ABNORMAL HIGH (ref 0–37)
Albumin: 3.7 g/dL (ref 3.5–5.2)
Alkaline Phosphatase: 72 U/L (ref 39–117)
Anion gap: 8 (ref 5–15)
BUN: 6 mg/dL (ref 6–23)
CALCIUM: 9.1 mg/dL (ref 8.4–10.5)
CHLORIDE: 99 meq/L (ref 96–112)
CO2: 28 mmol/L (ref 19–32)
Creatinine, Ser: 0.53 mg/dL (ref 0.50–1.10)
GFR calc non Af Amer: >90 mL/min (ref >90)
Glucose, Bld: 118 mg/dL — ABNORMAL HIGH (ref 70–99)
POTASSIUM: 4.2 mmol/L (ref 3.5–5.1)
Sodium: 135 mmol/L (ref 135–145)
Total Bilirubin: 0.6 mg/dL (ref 0.3–1.2)
Total Protein: 6.8 g/dL (ref 6.0–8.3)

## 2014-12-10 LAB — CBC WITH DIFFERENTIAL/PLATELET
Basophils Absolute: 0 10*3/uL (ref 0.0–0.1)
Basophils Relative: 0 % (ref 0–1)
Eosinophils Absolute: 0.2 10*3/uL (ref 0.0–0.7)
Eosinophils Relative: 2 % (ref 0–5)
HEMATOCRIT: 46 % (ref 36.0–46.0)
HEMOGLOBIN: 14.8 g/dL (ref 12.0–15.0)
LYMPHS PCT: 16 % (ref 12–46)
Lymphs Abs: 1.3 10*3/uL (ref 0.7–4.0)
MCH: 31 pg (ref 26.0–34.0)
MCHC: 32.2 g/dL (ref 30.0–36.0)
MCV: 96.2 fL (ref 78.0–100.0)
MONOS PCT: 8 % (ref 3–12)
Monocytes Absolute: 0.7 10*3/uL (ref 0.1–1.0)
NEUTROS ABS: 6.4 10*3/uL (ref 1.7–7.7)
Neutrophils Relative %: 74 % (ref 43–77)
Platelets: 151 10*3/uL (ref 150–400)
RBC: 4.78 MIL/uL (ref 3.87–5.11)
RDW: 12.8 % (ref 11.5–15.5)
WBC: 8.6 10*3/uL (ref 4.0–10.5)

## 2014-12-10 LAB — BRAIN NATRIURETIC PEPTIDE: B Natriuretic Peptide: 48.5 pg/mL (ref 0.0–100.0)

## 2014-12-10 LAB — I-STAT TROPONIN, ED: Troponin i, poc: 0 ng/mL (ref 0.00–0.08)

## 2014-12-10 LAB — PREGNANCY, URINE: Preg Test, Ur: NEGATIVE

## 2014-12-10 MED ORDER — OXYCODONE-ACETAMINOPHEN 5-325 MG PO TABS: 1.0000 | ORAL_TABLET | ORAL | Status: DC | PRN

## 2014-12-10 MED ORDER — ONDANSETRON 4 MG PO TBDP: 4.0000 mg | ORAL_TABLET | Freq: Three times a day (TID) | ORAL | Status: DC | PRN

## 2014-12-10 MED ORDER — ONDANSETRON HCL 4 MG/2ML IJ SOLN
4.0000 mg | Freq: Once | INTRAMUSCULAR | Status: AC
  Administered 2014-12-10: 4 mg via INTRAVENOUS
  Filled 2014-12-10: qty 2

## 2014-12-10 MED ORDER — MORPHINE SULFATE 4 MG/ML IJ SOLN
4.0000 mg | Freq: Once | INTRAMUSCULAR | Status: AC
  Administered 2014-12-10: 4 mg via INTRAVENOUS
  Filled 2014-12-10: qty 1

## 2014-12-10 NOTE — ED Provider Notes (Signed)
CSN: [id number]     Arrival date & time 12/10/14  1759 History   First MD Initiated Contact with Patient 12/10/14 2030     Chief Complaint  Patient presents with  . Abdominal Pain     (Consider location/radiation/quality/duration/timing/severity/associated sxs/prior Treatment) The history is provided by the patient and medical records.    This is a 39 year old female with past medical history significant for COPD, congestive heart failure, migraine headaches, cirrhosis, presenting to the ED for abdominal pain.  Patient states she has had intermittent epigastric pain for the past 2 days. States when pain occurs she has associated nausea but denies vomiting. No bowel movement today, but otherwise normal. Freely passing flatus.  No urinary symptoms or vaginal complaints. Patient denies any chest pain or shortness of breath. Patient is oxygen dependent, baseline 2 L in use.  No fever, chills, or sweats. Prior abdominal surgeries include appendectomy.  Has tolerated PO today without difficulty.  VS stable on arrival.  Past Medical History  Diagnosis Date  . COPD (chronic obstructive pulmonary disease)     PFTS 03/08/12: fev1 0.58L/1%, FVC 1.18/33%, Ratop 49 and c/w ssevere obstruction. 21% BD response on FVC, RV 219%, DLCO 11/54%  . History of alcoholism   . Irregular menses   . DVT of upper extremity (deep vein thrombosis) April 2013    right subclavian // Unclear precipitating cause - possibly significant right heart failure, with vascular stasis potentially predisposing to clotting.    . Pulmonary embolus April 2013    Precipitating cause unclear. Was treated with coumadin from April-June 2013.  . Moderate to severe pulmonary hypertension April 2013    Cardiac cath on 03/06/12 - 1. Elevated pulmonary artery pressures, right sided filling pressures.,  2. PA: 64/45 (mean 53)    . Hepatic cirrhosis     Questionable history of - Noted on CT abdomen (03/2012) - thought to be due to vascular congestion  from right heart failure +/- patient's history of alcohol abuse  . Axillary adenopathy     right axillary adenopathy noted on CT chest (03/06/2012)  . Periodontal disease   . Chronic right-sided CHF (congestive heart failure) 03/23/2012    with cor pulmonale. Last RHC 03/2012  . Pneumonia ~ 1985; 01/2011  . History of chronic bronchitis   . Exertional shortness of breath   . On home oxygen therapy     "2-3 L 24/7" (05/03/2013)  . OSA (obstructive sleep apnea)     wears noctural BiPAP (05/03/2013)  . Migraine     "~ 1/yr" (05/03/2013)   Past Surgical History  Procedure Laterality Date  . Cardiac surgery  10/16/1977    "my heart was backwards" (05/03/2013)  . Lung surgery  10/16/1977  . Appendectomy  05/03/2013  . Cardiac catheterization  03/2012  . Laparoscopic appendectomy N/A 05/03/2013    Procedure: APPENDECTOMY LAPAROSCOPIC;  Surgeon: Cherylynn RidgesJames O Wyatt, MD;  [facility];  Service: General;  Laterality: N/A;  . Right heart catheterization N/A 03/07/2012    Procedure: RIGHT HEART CATH;  Surgeon: Kathleene Hazelhristopher D McAlhany, MD;  [facility];  Service: Cardiovascular;  Laterality: N/A;   Family History  Problem Relation Age of Onset  . Hypertension Father   . Heart disease Father     CHF; died age 38   . Alcohol abuse Father   . Other Brother     died age 38 y.o overdose   History  Substance Use Topics  . Smoking status: Former Smoker -- 1.00  packs/day for 25 years    Types: Cigarettes    Quit date: 02/18/2012  . Smokeless tobacco: Never Used  . Alcohol Use: No     Comment: previously drinking 1 pint 3-4 days a week for 2002-2013, but in 2014, no longer drinking   OB History    No data available     Review of Systems  Gastrointestinal: Positive for abdominal pain.  All other systems reviewed and are negative.     Allergies  Review of patient's allergies indicates no known allergies.  Home Medications   Prior to Admission medications   Medication Sig Start Date  End Date Taking? Authorizing Provider  albuterol (PROVENTIL HFA;VENTOLIN HFA) 108 (90 BASE) MCG/ACT inhaler Inhale 2 puffs into the lungs every 6 (six) hours as needed for wheezing or shortness of breath. 09/06/14   Kalman Shan, MD  Fluticasone-Salmeterol (ADVAIR) 250-50 MCG/DOSE AEPB Inhale 1 puff into the lungs every 12 (twelve) hours. 09/27/14   Kalman Shan, MD  tiotropium (SPIRIVA) 18 MCG inhalation capsule Place 1 capsule (18 mcg total) into inhaler and inhale daily. 09/27/14   Kalman Shan, MD  torsemide (DEMADEX) 20 MG tablet Take 1 tablet (20 mg total) by mouth daily. 09/10/14   Lewayne Bunting, MD  VENTOLIN HFA 108 (90 BASE) MCG/ACT inhaler USE 2 PUFFS EVERY 6 HOURS AS NEEDED FOR WHEEZING 12/09/14   Kalman Shan, MD   BP 122/79 mmHg  Pulse 78  Temp(Src) 98.2 F (36.8 C) (Oral)  Resp 19  Ht  (1.6 m)  Wt 195 lb (88.451 kg)  BMI 34.55 kg/m2  SpO2 99%  LMP 12/10/2014   Physical Exam  Constitutional: She is oriented to person, place, and time. She appears well-developed and well-nourished. No distress.  HENT:  Head: Normocephalic and atraumatic.  Mouth/Throat: Oropharynx is clear and moist.  Eyes: Conjunctivae and EOM are normal. Pupils are equal, round, and reactive to light.  Neck: Normal range of motion. Neck supple.  Cardiovascular: Normal rate, regular rhythm and normal heart sounds.   Pulmonary/Chest: Effort normal and breath sounds normal. No respiratory distress. She has no wheezes.  Abdominal: Soft. Bowel sounds are normal. There is tenderness in the epigastric area. There is no CVA tenderness, no tenderness at McBurney's point and negative Murphy's sign.  Abdomen soft, appears distended but patient states this is baseline for her, mild tenderness noted in epigastrium, negative Murphy's sign  Musculoskeletal: Normal range of motion.  Neurological: She is alert and oriented to person, place, and time.  Skin: Skin is warm and dry. She is not diaphoretic.   Psychiatric: She has a normal mood and affect.  Nursing note and vitals reviewed.   ED Course  Procedures (including critical care time) Labs Review Labs Reviewed  COMPREHENSIVE METABOLIC PANEL - Abnormal; Notable for the following:    Glucose, Bld 118 (*)    AST 75 (*)    ALT 66 (*)    All other components within normal limits  CBC WITH DIFFERENTIAL  LIPASE, BLOOD  BRAIN NATRIURETIC PEPTIDE  URINALYSIS, ROUTINE W REFLEX MICROSCOPIC  PREGNANCY, URINE  I-STAT TROPOININ, ED    Imaging Review US Abdomen Complete  12/10/2014   CLINICAL DATA:  Epigastric pain. Pain for 3 days, now worsening. History of cirrhosis and alcohol use.  EXAM: ULTRASOUND ABDOMEN COMPLETE  COMPARISON:  09/30/2014  FINDINGS: Gallbladder: No gallstones or wall thickening visualized. No sonographic Murphy sign noted.  Common bile duct: Diameter: 6.3 mm, normal  Liver: Diffusely increased hepatic parenchymal echotexture  consistent with diffuse fatty infiltration.  IVC: Not visualized due to overlying bowel gas.  Pancreas: Not visualized due to overlying bowel gas.  Spleen: Spleen appears mildly enlarged, measuring 12.2 cm length. Volume is calculated at 347 mL. No focal lesions.  Right Kidney: Length: 11.9 cm. Echogenicity within normal limits. No mass or hydronephrosis visualized.  Left Kidney: Length: 10.9 cm. Echogenicity within normal limits. No mass or hydronephrosis visualized.  Abdominal aorta: Limited visualization.  No aneurysm identified.  Other findings: None.  IMPRESSION: Diffuse fatty infiltration of the liver.  Mild splenic enlargement.   Electronically Signed   By: Burman Nieves M.D.   On: 12/10/2014 23:14     EKG Interpretation None      MDM   Final diagnoses:  Epigastric pain  Splenomegaly   39 year old female with intermittent epigastric pain for past 2 days. On exam, patient afebrile and nontoxic in appearance. She does have tenderness in her epigastrium, negative Murphy's sign. Labwork is  overall reassuring-- mild elevation of AST/ALT. Ultrasound was obtained revealing fatty liver which has been seen in the past and splenomegaly. Patient has had no recent trauma to her abdomen, no anemia or thrombocytopenia, no signs of ongoing infection.  Unsure of significance of her splenomegaly, however doubt emergent pathology at this time.  Pain controlled in the ED with zofran and morphine.  Patient will be discharged home with symptomatic control.  Encouraged to FU with her PCP closely-- copies of labs and imaging studies given for their review.  Discussed plan with patient, he/she acknowledged understanding and agreed with plan of care.  Return precautions given for new or worsening symptoms.  Case discussed with attending physician, Dr. Jeraldine Loots, who agrees with assessment and plan of care.  Garlon Hatchet, PA-C 12/13/14 1226  Gerhard Munch, MD 12/14/14 2237

## 2014-12-10 NOTE — ED Notes (Signed)
Pt states she has been having upper abd pain off and on the past 2 days. Feels like a band across her upper abd. Pt reports being tired and now when the pain comes feels very nauseated. Is on home oxygen as well.

## 2014-12-10 NOTE — Discharge Instructions (Signed)
Take the prescribed medication as directed. Follow-up with your primary care physician-- copies of labs and u/s on back for their review. Return to the ED for new or worsening symptoms.

## 2014-12-13 ENCOUNTER — Encounter (HOSPITAL_COMMUNITY): Payer: Self-pay

## 2014-12-13 ENCOUNTER — Emergency Department (HOSPITAL_COMMUNITY): Payer: 59

## 2014-12-13 ENCOUNTER — Inpatient Hospital Stay (HOSPITAL_COMMUNITY)
Admission: EM | Admit: 2014-12-13 | Discharge: 2014-12-18 | DRG: 392 | Disposition: A | Discharge status: Home / Self Care | Payer: 59 | Attending: Internal Medicine | Admitting: Internal Medicine

## 2014-12-13 DIAGNOSIS — K3184 Gastroparesis: Secondary | ICD-10-CM | POA: Diagnosis not present

## 2014-12-13 DIAGNOSIS — R110 Nausea: Secondary | ICD-10-CM | POA: Diagnosis present

## 2014-12-13 DIAGNOSIS — Z8249 Family history of ischemic heart disease and other diseases of the circulatory system: Secondary | ICD-10-CM

## 2014-12-13 DIAGNOSIS — K7030 Alcoholic cirrhosis of liver without ascites: Secondary | ICD-10-CM | POA: Diagnosis present

## 2014-12-13 DIAGNOSIS — Z87891 Personal history of nicotine dependence: Secondary | ICD-10-CM

## 2014-12-13 DIAGNOSIS — I509 Heart failure, unspecified: Secondary | ICD-10-CM | POA: Diagnosis present

## 2014-12-13 DIAGNOSIS — G43909 Migraine, unspecified, not intractable, without status migrainosus: Secondary | ICD-10-CM | POA: Diagnosis present

## 2014-12-13 DIAGNOSIS — J9610 Chronic respiratory failure, unspecified whether with hypoxia or hypercapnia: Secondary | ICD-10-CM | POA: Diagnosis present

## 2014-12-13 DIAGNOSIS — J438 Other emphysema: Secondary | ICD-10-CM | POA: Insufficient documentation

## 2014-12-13 DIAGNOSIS — R109 Unspecified abdominal pain: Secondary | ICD-10-CM | POA: Diagnosis not present

## 2014-12-13 DIAGNOSIS — D72829 Elevated white blood cell count, unspecified: Secondary | ICD-10-CM

## 2014-12-13 DIAGNOSIS — I50812 Chronic right heart failure: Secondary | ICD-10-CM

## 2014-12-13 DIAGNOSIS — F1021 Alcohol dependence, in remission: Secondary | ICD-10-CM | POA: Diagnosis present

## 2014-12-13 DIAGNOSIS — I272 Other secondary pulmonary hypertension: Secondary | ICD-10-CM | POA: Diagnosis present

## 2014-12-13 DIAGNOSIS — G4733 Obstructive sleep apnea (adult) (pediatric): Secondary | ICD-10-CM | POA: Diagnosis present

## 2014-12-13 DIAGNOSIS — Z9981 Dependence on supplemental oxygen: Secondary | ICD-10-CM

## 2014-12-13 DIAGNOSIS — J449 Chronic obstructive pulmonary disease, unspecified: Secondary | ICD-10-CM | POA: Diagnosis present

## 2014-12-13 DIAGNOSIS — R599 Enlarged lymph nodes, unspecified: Secondary | ICD-10-CM | POA: Diagnosis present

## 2014-12-13 DIAGNOSIS — K219 Gastro-esophageal reflux disease without esophagitis: Secondary | ICD-10-CM | POA: Insufficient documentation

## 2014-12-13 DIAGNOSIS — K5900 Constipation, unspecified: Secondary | ICD-10-CM | POA: Insufficient documentation

## 2014-12-13 DIAGNOSIS — Z811 Family history of alcohol abuse and dependence: Secondary | ICD-10-CM

## 2014-12-13 DIAGNOSIS — Z86718 Personal history of other venous thrombosis and embolism: Secondary | ICD-10-CM

## 2014-12-13 DIAGNOSIS — R945 Abnormal results of liver function studies: Secondary | ICD-10-CM | POA: Diagnosis present

## 2014-12-13 DIAGNOSIS — Z79899 Other long term (current) drug therapy: Secondary | ICD-10-CM

## 2014-12-13 DIAGNOSIS — E860 Dehydration: Secondary | ICD-10-CM

## 2014-12-13 DIAGNOSIS — R509 Fever, unspecified: Secondary | ICD-10-CM | POA: Diagnosis present

## 2014-12-13 LAB — CBC WITH DIFFERENTIAL/PLATELET
BASOS ABS: 0 10*3/uL (ref 0.0–0.1)
Basophils Relative: 0 % (ref 0–1)
EOS ABS: 0.1 10*3/uL (ref 0.0–0.7)
Eosinophils Relative: 1 % (ref 0–5)
HCT: 44.2 % (ref 36.0–46.0)
Hemoglobin: 14.2 g/dL (ref 12.0–15.0)
Lymphocytes Relative: 11 % — ABNORMAL LOW (ref 12–46)
Lymphs Abs: 1.4 10*3/uL (ref 0.7–4.0)
MCH: 31.6 pg (ref 26.0–34.0)
MCHC: 32.1 g/dL (ref 30.0–36.0)
MCV: 98.2 fL (ref 78.0–100.0)
MONOS PCT: 10 % (ref 3–12)
Monocytes Absolute: 1.2 10*3/uL — ABNORMAL HIGH (ref 0.1–1.0)
NEUTROS ABS: 9.5 10*3/uL — AB (ref 1.7–7.7)
Neutrophils Relative %: 78 % — ABNORMAL HIGH (ref 43–77)
Platelets: 182 10*3/uL (ref 150–400)
RBC: 4.5 MIL/uL (ref 3.87–5.11)
RDW: 12.7 % (ref 11.5–15.5)
WBC: 12.2 10*3/uL — ABNORMAL HIGH (ref 4.0–10.5)

## 2014-12-13 LAB — POC OCCULT BLOOD, ED: Fecal Occult Bld: NEGATIVE

## 2014-12-13 LAB — COMPREHENSIVE METABOLIC PANEL
ALK PHOS: 77 U/L (ref 39–117)
ALT: 72 U/L — AB (ref 0–35)
AST: 53 U/L — AB (ref 0–37)
Albumin: 3.7 g/dL (ref 3.5–5.2)
Anion gap: 10 (ref 5–15)
BUN: 8 mg/dL (ref 6–23)
CO2: 30 mmol/L (ref 19–32)
CREATININE: 0.57 mg/dL (ref 0.50–1.10)
Calcium: 8.9 mg/dL (ref 8.4–10.5)
Chloride: 96 mEq/L (ref 96–112)
GFR calc Af Amer: >90 mL/min (ref >90)
Glucose, Bld: 106 mg/dL — ABNORMAL HIGH (ref 70–99)
POTASSIUM: 4.1 mmol/L (ref 3.5–5.1)
Sodium: 136 mmol/L (ref 135–145)
Total Bilirubin: 1.2 mg/dL (ref 0.3–1.2)
Total Protein: 7.2 g/dL (ref 6.0–8.3)

## 2014-12-13 LAB — LIPASE, BLOOD: LIPASE: 18 U/L (ref 11–59)

## 2014-12-13 LAB — I-STAT CG4 LACTIC ACID, ED: LACTIC ACID, VENOUS: 0.33 mmol/L — AB (ref 0.5–2.2)

## 2014-12-13 MED ORDER — IOHEXOL 300 MG/ML  SOLN
100.0000 mL | Freq: Once | INTRAMUSCULAR | Status: AC | PRN
  Administered 2014-12-13: 100 mL via INTRAVENOUS

## 2014-12-13 MED ORDER — IOHEXOL 300 MG/ML  SOLN
25.0000 mL | Freq: Once | INTRAMUSCULAR | Status: AC | PRN
  Administered 2014-12-13: 25 mL via ORAL

## 2014-12-13 MED ORDER — SODIUM CHLORIDE 0.9 % IV BOLUS (SEPSIS)
500.0000 mL | Freq: Once | INTRAVENOUS | Status: AC
  Administered 2014-12-13: 500 mL via INTRAVENOUS

## 2014-12-13 MED ORDER — ACETAMINOPHEN 500 MG PO TABS
1000.0000 mg | ORAL_TABLET | Freq: Once | ORAL | Status: AC
  Administered 2014-12-13: 1000 mg via ORAL
  Filled 2014-12-13: qty 2

## 2014-12-13 MED ORDER — ONDANSETRON HCL 4 MG/2ML IJ SOLN
4.0000 mg | Freq: Once | INTRAMUSCULAR | Status: AC
  Administered 2014-12-13: 4 mg via INTRAVENOUS
  Filled 2014-12-13: qty 2

## 2014-12-13 MED ORDER — MORPHINE SULFATE 4 MG/ML IJ SOLN
4.0000 mg | Freq: Once | INTRAMUSCULAR | Status: AC
  Administered 2014-12-13: 4 mg via INTRAVENOUS
  Filled 2014-12-13: qty 1

## 2014-12-13 NOTE — ED Notes (Signed)
[facility] days [facility].  Ultrasound/labs completed.  Told her spleen was slightly enlarged. Given nausea meds and told to go to primary MD.  Can't get into primary MD until Monday.  Pain continues.  Nausea with no vomiting.  No fever.  No change in urination.

## 2014-12-13 NOTE — ED Provider Notes (Signed)
CSN: [id number]     Arrival date & time 12/13/14  1812 History   First MD Initiated Contact with Patient 12/13/14 2015     Chief Complaint  Patient [name] with  . Abdominal Pain     (Consider location/radiation/quality/duration/timing/severity/associated sxs/prior Treatment) Patient is a 39 y.o. female presenting with abdominal pain. The history is provided by the patient and medical records. No language interpreter was used.  Abdominal Pain Associated symptoms: nausea   Associated symptoms: no chest pain, no constipation, no cough, no diarrhea, no dysuria, no fatigue, no fever, no hematuria, no shortness of breath and no vomiting      Mary [name] is a 39 y.o. female  with a hx of COPD, congestive heart failure, migraine headaches, cirrhosis [name] to the Emergency Department complaining of intermittent progressively worsening epigastric pain onset 5 days ago.  Pt reports she [facility], d/c home with pain medication.  She reports she had US and labs that were "fine."  She reports almost no PO intake in 24 hours because of the pain and decreased appetite.  Pt reports the zofran has helped some.  Pt reports the pain is squeezing in nature, rated at a 7/10.  No bowel movement today, but pt reports a normal BM yesterday. Freely passing flatus. No urinary symptoms or vaginal complaints, chest pain or shortness of breath. Patient is oxygen dependent, baseline 2 L in use. No fever, chills, or sweats. Prior abdominal surgeries include appendectomy.  Pt denies pain like this in the past.  Patient denies dark or bloody stools. She denies known varices or GI bleeds. Patient reports she is no longer an alcoholic.  Past Medical History  Diagnosis Date  . COPD (chronic obstructive pulmonary disease)     PFTS 03/08/12: fev1 0.58L/1%, FVC 1.18/33%, Ratop 49 and c/w ssevere obstruction. 21% BD response on FVC, RV 219%, DLCO 11/54%  . History of alcoholism   . Irregular menses   . DVT of  upper extremity (deep vein thrombosis) April 2013    right subclavian // Unclear precipitating cause - possibly significant right heart failure, with vascular stasis potentially predisposing to clotting.    . Pulmonary embolus April 2013    Precipitating cause unclear. Was treated with coumadin from April-June 2013.  . Moderate to severe pulmonary hypertension April 2013    Cardiac cath on 03/06/12 - 1. Elevated pulmonary artery pressures, right sided filling pressures.,  2. PA: 64/45 (mean 53)    . Hepatic cirrhosis     Questionable history of - Noted on CT abdomen (03/2012) - thought to be due to vascular congestion from right heart failure +/- patient's history of alcohol abuse  . Axillary adenopathy     right axillary adenopathy noted on CT chest (03/06/2012)  . Periodontal disease   . Chronic right-sided CHF (congestive heart failure) 03/23/2012    with cor pulmonale. Last RHC 03/2012  . Pneumonia ~ 1985; 01/2011  . History of chronic bronchitis   . Exertional shortness of breath   . On home oxygen therapy     "2-3 L 24/7" (05/03/2013)  . OSA (obstructive sleep apnea)     wears noctural BiPAP (05/03/2013)  . Migraine     "~ 1/yr" (05/03/2013)   Past Surgical History  Procedure Laterality Date  . Cardiac surgery  10/16/1977    "my heart was backwards" (05/03/2013)  . Lung surgery  10/16/1977  . Appendectomy  05/03/2013  . Cardiac catheterization  03/2012  . Laparoscopic appendectomy  N/A 05/03/2013    Procedure: APPENDECTOMY LAPAROSCOPIC;  Surgeon: Cherylynn Ridges, MD;  Location: [facility];  Service: General;  Laterality: N/A;  . Right heart catheterization N/A 03/07/2012    Procedure: RIGHT HEART CATH;  Surgeon: Kathleene Hazel, MD;  Location: [facility] CATH LAB;  Service: Cardiovascular;  Laterality: N/A;   Family History  Problem Relation Age of Onset  . Hypertension Father   . Heart disease Father     CHF; died age 4   . Alcohol abuse Father   . Other Brother     died age 37 y.o  overdose   History  Substance Use Topics  . Smoking status: Former Smoker -- 1.00 packs/day for 25 years    Types: Cigarettes    Quit date: 02/18/2012  . Smokeless tobacco: Never Used  . Alcohol Use: No     Comment: previously drinking 1 pint 3-4 days a week for 2002-2013, but in 2014, no longer drinking   OB History    No data available     Review of Systems  Constitutional: Positive for appetite change. Negative for fever, diaphoresis, fatigue and unexpected weight change.  HENT: Negative for mouth sores and trouble swallowing.   Respiratory: Negative for cough, chest tightness, shortness of breath, wheezing and stridor.   Cardiovascular: Negative for chest pain and palpitations.  Gastrointestinal: Positive for nausea and abdominal pain. Negative for vomiting, diarrhea, constipation, blood in stool, abdominal distention and rectal pain.  Genitourinary: Negative for dysuria, urgency, frequency, hematuria, flank pain and difficulty urinating.  Musculoskeletal: Negative for back pain, neck pain and neck stiffness.  Skin: Negative for rash.  Neurological: Negative for weakness.  Hematological: Negative for adenopathy.  Psychiatric/Behavioral: Negative for confusion.  All other systems reviewed and are negative.     Allergies  Review of patient's allergies indicates no known allergies.  Home Medications   Prior to Admission medications   Medication Sig Start Date End Date Taking? Authorizing Provider  albuterol (PROVENTIL HFA;VENTOLIN HFA) 108 (90 BASE) MCG/ACT inhaler Inhale 2 puffs into the lungs every 6 (six) hours as needed for wheezing or shortness of breath. 09/06/14  Yes Kalman Shan, MD  Fluticasone-Salmeterol (ADVAIR) 250-50 MCG/DOSE AEPB Inhale 1 puff into the lungs every 12 (twelve) hours. 09/27/14  Yes Kalman Shan, MD  ondansetron (ZOFRAN ODT) 4 MG disintegrating tablet Take 1 tablet (4 mg total) by mouth every 8 (eight) hours as needed for nausea. 12/10/14   Yes Garlon Hatchet, PA-C  oxyCODONE-acetaminophen (PERCOCET/ROXICET) 5-325 MG per tablet Take 1 tablet by mouth every 4 (four) hours as needed. 12/10/14  Yes Garlon Hatchet, PA-C  tiotropium (SPIRIVA) 18 MCG inhalation capsule Place 1 capsule (18 mcg total) into inhaler and inhale daily. 09/27/14  Yes Kalman Shan, MD  torsemide (DEMADEX) 20 MG tablet Take 1 tablet (20 mg total) by mouth daily. 09/10/14  Yes Lewayne Bunting, MD  VENTOLIN HFA 108 (90 BASE) MCG/ACT inhaler USE 2 PUFFS EVERY 6 HOURS AS NEEDED FOR WHEEZING 12/09/14   Kalman Shan, MD   BP 112/67 mmHg  Pulse 83  Temp(Src) 98.8 F (37.1 C) (Oral)  Resp 22  SpO2 97%  LMP 12/10/2014 Physical Exam  Constitutional: She appears well-developed and well-nourished.  HENT:  Head: Normocephalic and atraumatic.  Mouth/Throat: Oropharynx is clear and moist.  Eyes: Conjunctivae are normal. No scleral icterus.  Cardiovascular: Regular rhythm, normal heart sounds and intact distal pulses.   Tachycardia  Pulmonary/Chest: Effort normal and breath sounds normal.  Abdominal: Soft.  Bowel sounds are normal. She exhibits distension and fluid wave. She exhibits no mass. There is tenderness in the right upper quadrant, epigastric area and left upper quadrant. There is guarding. There is no rebound and no CVA tenderness.  Distended abdomen with positive fluid wave and right upper quadrant, epigastric and left upper quadrant abdominal tenderness. Patient with guarding in the upper abdomen. No tenderness to palpation or guarding with palpation to the lower abdomen No rigidity or peritoneal signs. No CVA tenderness  Neurological: She is alert.  Skin: Skin is warm and dry.  Psychiatric: She has a normal mood and affect.  Nursing note and vitals reviewed.   ED Course  Procedures (including critical care time) Labs Review Labs Reviewed  CBC WITH DIFFERENTIAL - Abnormal; Notable for the following:    WBC 12.2 (*)    Neutrophils Relative % 78 (*)     Neutro Abs 9.5 (*)    Lymphocytes Relative 11 (*)    Monocytes Absolute 1.2 (*)    All other components within normal limits  COMPREHENSIVE METABOLIC PANEL - Abnormal; Notable for the following:    Glucose, Bld 106 (*)    AST 53 (*)    ALT 72 (*)    All other components within normal limits  URINALYSIS, ROUTINE W REFLEX MICROSCOPIC - Abnormal; Notable for the following:    Color, Urine AMBER (*)    APPearance CLOUDY (*)    Specific Gravity, Urine >1.046 (*)    Bilirubin Urine MODERATE (*)    Ketones, ur >80 (*)    Protein, ur 30 (*)    All other components within normal limits  I-STAT CG4 LACTIC ACID, ED - Abnormal; Notable for the following:    Lactic Acid, Venous 0.33 (*)    All other components within normal limits  LIPASE, BLOOD  URINE MICROSCOPIC-ADD ON  INFLUENZA PANEL BY PCR (TYPE A & B, H1N1)  POC OCCULT BLOOD, ED    Imaging Review Ct Abdomen Pelvis W Contrast  12/13/2014   CLINICAL DATA:  Mid abdominal and epigastric pain for 2 days. Nausea. History of COPD and CHF.  EXAM: CT ABDOMEN AND PELVIS WITH CONTRAST  TECHNIQUE: Multidetector CT imaging of the abdomen and pelvis was performed using the standard protocol following bolus administration of intravenous contrast.  CONTRAST:  25mL OMNIPAQUE IOHEXOL 300 MG/ML SOLN, OMNIPAQUE IOHEXOL 300 MG/ML SOLN  COMPARISON:  05/03/2013 from [facility].  FINDINGS: Mild dependent atelectasis or fibrosis in the lung bases.  Diffuse fatty infiltration of the liver. No focal liver lesions identified. The gallbladder, spleen, pancreas, adrenal glands, kidneys, abdominal aorta, inferior vena cava, and retroperitoneal lymph nodes are unremarkable. Stomach is decompressed, limiting evaluation of the gastric wall. There may be gastric wall thickening suggesting gastritis. Clinical correlation is recommended. Visualized small bowel are unremarkable. Under distention limits evaluation. Diffusely stool-filled colon without abnormal  distention or wall thickening appreciated. No free air or free fluid in the abdomen. Abdominal wall musculature appears intact.  Pelvis: Surgical absence of the appendix. Uterus and ovaries are not enlarged. Bladder wall is not thickened. No free or loculated pelvic fluid collections. No pelvic mass or lymphadenopathy. Normal alignment of the lumbar spine. No destructive bone lesions appreciated.  IMPRESSION: Diffuse fatty infiltration of the liver. Possible gastric wall thickening which may suggest gastritis although under distention limits evaluation. No evidence of small bowel obstruction.   Electronically Signed   By: Burman Nieves M.D.   On: 12/13/2014 23:17  EKG Interpretation None      MDM   Final diagnoses:  Abdominal pain  Fever  Dehydration  Leukocytosis  Other emphysema  Alcoholic cirrhosis of liver without ascites    Mary [name] with increasing pain since evaluation on 12/10/2014 at [facility]. Patient reports pain medicine helps initially but wears off quickly and her pain returns. She reports her pain has worsened. She denies fevers or chills. Patient with history of alcoholism and cirrhosis. She denies history of therapeutic paracentesis or SBP. She denies history of variceal bleed or dark melenic stools.  Patient reports she has an appointment for her primary care physician in 2 days. Patient without emesis or diarrhea.  CBC with no leukocytosis of 12.2, some elevation in AST and ALT new from 12/10/2014. Will obtain labwork, fecal occult and CT abdomen. Concern for possible SBP as patient is febrile with abdominal pain however pain is more focal in the epigastrium. Also concern for possible peptic ulcer disease vs variceal bleeding.  Vitals stable at this time.    The patient was discussed with and seen by Dr. Adriana Simas who agrees with the treatment plan.   11:45 AM Urine pending. Fecal occult negative. CT scan with diffuse fatty infiltration of the liver  and possible gastric wall thickening.  No ascites as expected. Patient is a bounce back with fever, difficult to control pain and leukocytosis. Concern she may need admission. Will reassess after her repeat pain medicine and urine.   1:09 AM Urinalysis with dehydration but no infection. Discussed at length with patient he wishes for admission if she feels there is still something wrong.  Discussed with Dr. Toniann Fail who will admit.  BP 112/67 mmHg  Pulse 83  Temp(Src) 98.8 F (37.1 C) (Oral)  Resp 22  SpO2 97%  LMP 12/10/2014   Daxen Lanum, PA-C 12/14/14 0139  Donnetta Hutching, MD 12/14/14 1109

## 2014-12-14 ENCOUNTER — Observation Stay (HOSPITAL_COMMUNITY): Payer: 59

## 2014-12-14 ENCOUNTER — Encounter (HOSPITAL_COMMUNITY): Payer: Self-pay | Admitting: Internal Medicine

## 2014-12-14 DIAGNOSIS — R509 Fever, unspecified: Secondary | ICD-10-CM | POA: Diagnosis present

## 2014-12-14 DIAGNOSIS — R109 Unspecified abdominal pain: Secondary | ICD-10-CM

## 2014-12-14 DIAGNOSIS — K219 Gastro-esophageal reflux disease without esophagitis: Secondary | ICD-10-CM | POA: Diagnosis present

## 2014-12-14 DIAGNOSIS — K3184 Gastroparesis: Secondary | ICD-10-CM | POA: Diagnosis present

## 2014-12-14 DIAGNOSIS — Z811 Family history of alcohol abuse and dependence: Secondary | ICD-10-CM | POA: Diagnosis not present

## 2014-12-14 DIAGNOSIS — J449 Chronic obstructive pulmonary disease, unspecified: Secondary | ICD-10-CM | POA: Diagnosis present

## 2014-12-14 DIAGNOSIS — K7030 Alcoholic cirrhosis of liver without ascites: Secondary | ICD-10-CM | POA: Diagnosis present

## 2014-12-14 DIAGNOSIS — F1021 Alcohol dependence, in remission: Secondary | ICD-10-CM | POA: Diagnosis present

## 2014-12-14 DIAGNOSIS — R599 Enlarged lymph nodes, unspecified: Secondary | ICD-10-CM | POA: Diagnosis present

## 2014-12-14 DIAGNOSIS — Z79899 Other long term (current) drug therapy: Secondary | ICD-10-CM | POA: Diagnosis not present

## 2014-12-14 DIAGNOSIS — R945 Abnormal results of liver function studies: Secondary | ICD-10-CM | POA: Diagnosis present

## 2014-12-14 DIAGNOSIS — I272 Other secondary pulmonary hypertension: Secondary | ICD-10-CM | POA: Diagnosis present

## 2014-12-14 DIAGNOSIS — R110 Nausea: Secondary | ICD-10-CM | POA: Diagnosis present

## 2014-12-14 DIAGNOSIS — G43909 Migraine, unspecified, not intractable, without status migrainosus: Secondary | ICD-10-CM | POA: Diagnosis present

## 2014-12-14 DIAGNOSIS — I270 Primary pulmonary hypertension: Secondary | ICD-10-CM

## 2014-12-14 DIAGNOSIS — Z8249 Family history of ischemic heart disease and other diseases of the circulatory system: Secondary | ICD-10-CM | POA: Diagnosis not present

## 2014-12-14 DIAGNOSIS — K5900 Constipation, unspecified: Secondary | ICD-10-CM | POA: Diagnosis present

## 2014-12-14 DIAGNOSIS — I509 Heart failure, unspecified: Secondary | ICD-10-CM

## 2014-12-14 DIAGNOSIS — Z86718 Personal history of other venous thrombosis and embolism: Secondary | ICD-10-CM | POA: Diagnosis not present

## 2014-12-14 DIAGNOSIS — E860 Dehydration: Secondary | ICD-10-CM | POA: Insufficient documentation

## 2014-12-14 DIAGNOSIS — J9610 Chronic respiratory failure, unspecified whether with hypoxia or hypercapnia: Secondary | ICD-10-CM | POA: Diagnosis present

## 2014-12-14 DIAGNOSIS — Z9981 Dependence on supplemental oxygen: Secondary | ICD-10-CM | POA: Diagnosis not present

## 2014-12-14 DIAGNOSIS — G4733 Obstructive sleep apnea (adult) (pediatric): Secondary | ICD-10-CM | POA: Diagnosis present

## 2014-12-14 DIAGNOSIS — Z87891 Personal history of nicotine dependence: Secondary | ICD-10-CM | POA: Diagnosis not present

## 2014-12-14 LAB — CBC WITH DIFFERENTIAL/PLATELET
BASOS ABS: 0 10*3/uL (ref 0.0–0.1)
BASOS PCT: 0 % (ref 0–1)
EOS ABS: 0.2 10*3/uL (ref 0.0–0.7)
Eosinophils Relative: 2 % (ref 0–5)
HEMATOCRIT: 42.3 % (ref 36.0–46.0)
HEMOGLOBIN: 13 g/dL (ref 12.0–15.0)
Lymphocytes Relative: 18 % (ref 12–46)
Lymphs Abs: 1.8 10*3/uL (ref 0.7–4.0)
MCH: 30.6 pg (ref 26.0–34.0)
MCHC: 30.7 g/dL (ref 30.0–36.0)
MCV: 99.5 fL (ref 78.0–100.0)
MONOS PCT: 12 % (ref 3–12)
Monocytes Absolute: 1.1 10*3/uL — ABNORMAL HIGH (ref 0.1–1.0)
NEUTROS ABS: 6.8 10*3/uL (ref 1.7–7.7)
Neutrophils Relative %: 68 % (ref 43–77)
Platelets: 171 10*3/uL (ref 150–400)
RBC: 4.25 MIL/uL (ref 3.87–5.11)
RDW: 12.9 % (ref 11.5–15.5)
WBC: 9.8 10*3/uL (ref 4.0–10.5)

## 2014-12-14 LAB — COMPREHENSIVE METABOLIC PANEL
ALBUMIN: 3.3 g/dL — AB (ref 3.5–5.2)
ALK PHOS: 68 U/L (ref 39–117)
ALT: 55 U/L — ABNORMAL HIGH (ref 0–35)
AST: 44 U/L — AB (ref 0–37)
Anion gap: 9 (ref 5–15)
BILIRUBIN TOTAL: 1.4 mg/dL — AB (ref 0.3–1.2)
BUN: 7 mg/dL (ref 6–23)
CO2: 29 mmol/L (ref 19–32)
CREATININE: 0.6 mg/dL (ref 0.50–1.10)
Calcium: 8.3 mg/dL — ABNORMAL LOW (ref 8.4–10.5)
Chloride: 97 mEq/L (ref 96–112)
GFR calc Af Amer: >90 mL/min (ref >90)
GFR calc non Af Amer: >90 mL/min (ref >90)
GLUCOSE: 84 mg/dL (ref 70–99)
POTASSIUM: 3.8 mmol/L (ref 3.5–5.1)
Sodium: 135 mmol/L (ref 135–145)
Total Protein: 6.2 g/dL (ref 6.0–8.3)

## 2014-12-14 LAB — URINALYSIS, ROUTINE W REFLEX MICROSCOPIC
Glucose, UA: NEGATIVE mg/dL
HGB URINE DIPSTICK: NEGATIVE
Ketones, ur: >80 mg/dL — AB
LEUKOCYTES UA: NEGATIVE
NITRITE: NEGATIVE
PROTEIN: 30 mg/dL — AB
Specific Gravity, Urine: >1.046 — ABNORMAL HIGH (ref 1.005–1.030)
Urobilinogen, UA: 1 mg/dL (ref 0.0–1.0)
pH: 6 (ref 5.0–8.0)

## 2014-12-14 LAB — URINE MICROSCOPIC-ADD ON

## 2014-12-14 LAB — INFLUENZA PANEL BY PCR (TYPE A & B)
H1N1FLUPCR: NOT DETECTED
INFLBPCR: NEGATIVE
Influenza A By PCR: NEGATIVE

## 2014-12-14 LAB — LIPASE, BLOOD: LIPASE: 25 U/L (ref 11–59)

## 2014-12-14 LAB — RAPID URINE DRUG SCREEN, HOSP PERFORMED
AMPHETAMINES: NOT DETECTED
Barbiturates: NOT DETECTED
Benzodiazepines: NOT DETECTED
Cocaine: NOT DETECTED
Opiates: POSITIVE — AB
TETRAHYDROCANNABINOL: NOT DETECTED

## 2014-12-14 LAB — TROPONIN I: Troponin I: <0.03 ng/mL (ref <0.031)

## 2014-12-14 LAB — PREGNANCY, URINE: Preg Test, Ur: NEGATIVE

## 2014-12-14 MED ORDER — TORSEMIDE 20 MG PO TABS
20.0000 mg | ORAL_TABLET | Freq: Every day | ORAL | Status: DC
  Administered 2014-12-14 – 2014-12-18 (×5): 20 mg via ORAL
  Filled 2014-12-14 (×5): qty 1

## 2014-12-14 MED ORDER — ACETAMINOPHEN 650 MG RE SUPP: 650.0000 mg | Freq: Four times a day (QID) | RECTAL | Status: DC | PRN

## 2014-12-14 MED ORDER — HYDROMORPHONE HCL 1 MG/ML IJ SOLN: 0.5000 mg | INTRAMUSCULAR | Status: DC | PRN

## 2014-12-14 MED ORDER — HYDROMORPHONE HCL 1 MG/ML IJ SOLN: 1.0000 mg | INTRAMUSCULAR | Status: DC | PRN

## 2014-12-14 MED ORDER — TIOTROPIUM BROMIDE MONOHYDRATE 18 MCG IN CAPS
18.0000 ug | ORAL_CAPSULE | Freq: Every day | RESPIRATORY_TRACT | Status: DC
  Administered 2014-12-14 – 2014-12-18 (×5): 18 ug via RESPIRATORY_TRACT
  Filled 2014-12-14: qty 5

## 2014-12-14 MED ORDER — ONDANSETRON HCL 4 MG/2ML IJ SOLN: 4.0000 mg | Freq: Three times a day (TID) | INTRAMUSCULAR | Status: DC | PRN

## 2014-12-14 MED ORDER — ALBUTEROL SULFATE (2.5 MG/3ML) 0.083% IN NEBU
2.5000 mg | INHALATION_SOLUTION | Freq: Four times a day (QID) | RESPIRATORY_TRACT | Status: DC | PRN
  Administered 2014-12-14 – 2014-12-16 (×3): 2.5 mg via RESPIRATORY_TRACT
  Filled 2014-12-14 (×3): qty 3

## 2014-12-14 MED ORDER — OXYCODONE-ACETAMINOPHEN 5-325 MG PO TABS
1.0000 | ORAL_TABLET | ORAL | Status: DC | PRN
  Administered 2014-12-14 – 2014-12-18 (×16): 1 via ORAL
  Filled 2014-12-14 (×17): qty 1

## 2014-12-14 MED ORDER — ONDANSETRON HCL 4 MG/2ML IJ SOLN: 4.0000 mg | Freq: Four times a day (QID) | INTRAMUSCULAR | Status: DC | PRN

## 2014-12-14 MED ORDER — PANTOPRAZOLE SODIUM 40 MG IV SOLR
40.0000 mg | Freq: Two times a day (BID) | INTRAVENOUS | Status: DC
  Administered 2014-12-14 – 2014-12-15 (×3): 40 mg via INTRAVENOUS
  Filled 2014-12-14 (×4): qty 40

## 2014-12-14 MED ORDER — ONDANSETRON 4 MG PO TBDP: 4.0000 mg | ORAL_TABLET | Freq: Three times a day (TID) | ORAL | Status: DC | PRN

## 2014-12-14 MED ORDER — SODIUM CHLORIDE 0.9 % IJ SOLN
3.0000 mL | Freq: Two times a day (BID) | INTRAMUSCULAR | Status: DC
  Administered 2014-12-14 – 2014-12-18 (×8): 3 mL via INTRAVENOUS

## 2014-12-14 MED ORDER — ENOXAPARIN SODIUM 40 MG/0.4ML ~~LOC~~ SOLN
40.0000 mg | SUBCUTANEOUS | Status: DC
  Administered 2014-12-14 – 2014-12-18 (×5): 40 mg via SUBCUTANEOUS
  Filled 2014-12-14 (×5): qty 0.4

## 2014-12-14 MED ORDER — ONDANSETRON HCL 4 MG PO TABS: 4.0000 mg | ORAL_TABLET | Freq: Four times a day (QID) | ORAL | Status: DC | PRN

## 2014-12-14 MED ORDER — ALBUTEROL SULFATE HFA 108 (90 BASE) MCG/ACT IN AERS: 2.0000 | INHALATION_SPRAY | Freq: Four times a day (QID) | RESPIRATORY_TRACT | Status: DC | PRN

## 2014-12-14 MED ORDER — HYDROMORPHONE HCL 1 MG/ML IJ SOLN
1.0000 mg | Freq: Once | INTRAMUSCULAR | Status: AC
  Administered 2014-12-14: 1 mg via INTRAVENOUS
  Filled 2014-12-14: qty 1

## 2014-12-14 MED ORDER — SODIUM CHLORIDE 0.9 % IV BOLUS (SEPSIS)
500.0000 mL | Freq: Once | INTRAVENOUS | Status: AC
  Administered 2014-12-14: 500 mL via INTRAVENOUS

## 2014-12-14 MED ORDER — MOMETASONE FURO-FORMOTEROL FUM 100-5 MCG/ACT IN AERO
2.0000 | INHALATION_SPRAY | Freq: Two times a day (BID) | RESPIRATORY_TRACT | Status: DC
  Administered 2014-12-14 – 2014-12-18 (×9): 2 via RESPIRATORY_TRACT
  Filled 2014-12-14: qty 8.8

## 2014-12-14 MED ORDER — ACETAMINOPHEN 325 MG PO TABS: 650.0000 mg | ORAL_TABLET | Freq: Four times a day (QID) | ORAL | Status: DC | PRN

## 2014-12-14 NOTE — Progress Notes (Signed)
39 year old lady with multiple medical problems admitted for abdominal pain for a week. She was started on IV protonix and IV hydration. She reports she has n't eaten in 2to 3 days . No signs of infection on UA, CXR and  CT abdomen. Continue to monitor.   Mary ModyVijaya Peony Barner, md [phone]

## 2014-12-14 NOTE — ED Notes (Signed)
Report given to Vonna KotykJay RN on 6E, nurse states that there is no bed in assigned room at this time but he will contact ED when room is ready

## 2014-12-14 NOTE — H&P (Signed)
[facility] History and Physical  TED LEONHART ZOX:[id number] DOB: 1976-01-19 DOA: 12/13/2014  Referring physician: ER physician. PCP: Elvina Sidle, MD  Chief Complaint: Abdominal pain.  HPI: Mary [name] is a 39 y.o. female history of chronic right heart failure, pulmonary hypertension and COPD and possible cirrhosis of the liver present to the ER because of increasing abdominal pain over the last 6 days. Patient's pain is mostly in the epigastric area with nausea denies any vomiting or diarrhea. Pain increases on eating and crampy in nature. In the ER patient required multiple doses of pain relief medications. CT abdomen and pelvis done shows possible gastritis. Labs are largely unremarkable except the patient had mild leukocytosis and UA was showing ketosis. Since patient is still having pain and unable to take in anything patient has been admitted for further management. Patient denies any chest pain or shortness of breath. In the ER patient has had a mild fever. Patient denies any productive cough dysuria discharges or diarrhea.   Review of Systems: As presented in the history of presenting illness, rest negative.  Past Medical History  Diagnosis Date  . COPD (chronic obstructive pulmonary disease)     PFTS 03/08/12: fev1 0.58L/1%, FVC 1.18/33%, Ratop 49 and c/w ssevere obstruction. 21% BD response on FVC, RV 219%, DLCO 11/54%  . History of alcoholism   . Irregular menses   . DVT of upper extremity (deep vein thrombosis) April 2013    right subclavian // Unclear precipitating cause - possibly significant right heart failure, with vascular stasis potentially predisposing to clotting.    . Pulmonary embolus April 2013    Precipitating cause unclear. Was treated with coumadin from April-June 2013.  . Moderate to severe pulmonary hypertension April 2013    Cardiac cath on 03/06/12 - 1. Elevated pulmonary artery pressures, right sided filling pressures.,  2. PA: 64/45 (mean 53)     . Hepatic cirrhosis     Questionable history of - Noted on CT abdomen (03/2012) - thought to be due to vascular congestion from right heart failure +/- patient's history of alcohol abuse  . Axillary adenopathy     right axillary adenopathy noted on CT chest (03/06/2012)  . Periodontal disease   . Chronic right-sided CHF (congestive heart failure) 03/23/2012    with cor pulmonale. Last RHC 03/2012  . Pneumonia ~ 1985; 01/2011  . History of chronic bronchitis   . Exertional shortness of breath   . On home oxygen therapy     "2-3 L 24/7" (05/03/2013)  . OSA (obstructive sleep apnea)     wears noctural BiPAP (05/03/2013)  . Migraine     "~ 1/yr" (05/03/2013)   Past Surgical History  Procedure Laterality Date  . Cardiac surgery  1977/01/13    "my heart was backwards" (05/03/2013)  . Lung surgery  12/17/1976  . Appendectomy  05/03/2013  . Cardiac catheterization  03/2012  . Laparoscopic appendectomy N/A 05/03/2013    Procedure: APPENDECTOMY LAPAROSCOPIC;  Surgeon: Cherylynn Ridges, MD;  Location: [facility] OR;  Service: General;  Laterality: N/A;  . Right heart catheterization N/A 03/07/2012    Procedure: RIGHT HEART CATH;  Surgeon: Kathleene Hazel, MD;  Location: [facility] CATH LAB;  Service: Cardiovascular;  Laterality: N/A;   Social History:  reports that she quit smoking about 2 years ago. Her smoking use included Cigarettes. She has a 25 pack-year smoking history. She has never used smokeless tobacco. She reports that she does not drink alcohol or use illicit drugs. Where  does patient live home. Can patient participate in ADLs? Yes.  No Known Allergies  Family History:  Family History  Problem Relation Age of Onset  . Hypertension Father   . Heart disease Father     CHF; died age 52   . Alcohol abuse Father   . Other Brother     died age 56 y.o overdose      Prior to Admission medications   Medication Sig Start Date End Date Taking? Authorizing Provider  albuterol (PROVENTIL HFA;VENTOLIN  HFA) 108 (90 BASE) MCG/ACT inhaler Inhale 2 puffs into the lungs every 6 (six) hours as needed for wheezing or shortness of breath. 09/06/14  Yes Kalman Shan, MD  Fluticasone-Salmeterol (ADVAIR) 250-50 MCG/DOSE AEPB Inhale 1 puff into the lungs every 12 (twelve) hours. 09/27/14  Yes Kalman Shan, MD  ondansetron (ZOFRAN ODT) 4 MG disintegrating tablet Take 1 tablet (4 mg total) by mouth every 8 (eight) hours as needed for nausea. 12/10/14  Yes Garlon Hatchet, PA-C  oxyCODONE-acetaminophen (PERCOCET/ROXICET) 5-325 MG per tablet Take 1 tablet by mouth every 4 (four) hours as needed. 12/10/14  Yes Garlon Hatchet, PA-C  tiotropium (SPIRIVA) 18 MCG inhalation capsule Place 1 capsule (18 mcg total) into inhaler and inhale daily. 09/27/14  Yes Kalman Shan, MD  torsemide (DEMADEX) 20 MG tablet Take 1 tablet (20 mg total) by mouth daily. 09/10/14  Yes Lewayne Bunting, MD  VENTOLIN HFA 108 (90 BASE) MCG/ACT inhaler USE 2 PUFFS EVERY 6 HOURS AS NEEDED FOR WHEEZING 12/09/14   Kalman Shan, MD    Physical Exam: Filed Vitals:   12/13/14 2059 12/13/14 2228 12/14/14 0104 12/14/14 0300  BP:  123/73 112/67 125/73  Pulse:  88 83 91  Temp: 101.5 F (38.6 C) 98.8 F (37.1 C)  98 F (36.7 C)  TempSrc: Rectal Oral  Oral  Resp:  Height:    5' 2.5" (1.588 m)  Weight:    86.1 kg (189 lb 13.1 oz)  SpO2:  98% 97% 94%     General:  Moderately built and nourished.  Eyes: Anicteric no pallor.  ENT: No discharge from the ears eyes nose or mouth.  Neck: No mass felt.  Cardiovascular: S1-S2 heard.  Respiratory: No rhonchi or crepitations.  Abdomen: Soft mild epigastric tenderness no guarding or rigidity.  Skin: No rash.  Musculoskeletal: No edema.  Psychiatric: Appears normal.  Neurologic: Alert awake oriented to time place and person. Moves all extremities.  Labs on Admission:  Basic Metabolic Panel:  Recent Labs Lab 12/10/14 1827 12/13/14 1858  NA 135 136  K 4.2 4.1  CL  99 96  CO2 28 30  GLUCOSE 118* 106*  BUN 6 8  CREATININE 0.53 0.57  CALCIUM 9.1 8.9   Liver Function Tests:  Recent Labs Lab 12/10/14 1827 12/13/14 1858  AST 75* 53*  ALT 66* 72*  ALKPHOS 72 77  BILITOT 0.6 1.2  PROT 6.8 7.2  ALBUMIN 3.7 3.7    Recent Labs Lab 12/10/14 1827 12/13/14 1858  LIPASE 28 18   No results for input(s): AMMONIA in the last 168 hours. CBC:  Recent Labs Lab 12/10/14 1827 12/13/14 1858  WBC 8.6 12.2*  NEUTROABS 6.4 9.5*  HGB 14.8 14.2  HCT 46.0 44.2  MCV 96.2 98.2  PLT 151 182   Cardiac Enzymes: No results for input(s): CKTOTAL, CKMB, CKMBINDEX, TROPONINI in the last 168 hours.  BNP (last 3 results)  Recent Labs  01/13/14 1237  PROBNP  185.2*   CBG: No results for input(s): GLUCAP in the last 168 hours.  Radiological Exams on Admission: Ct Abdomen Pelvis W Contrast  12/13/2014   CLINICAL DATA:  Mid abdominal and epigastric pain for 2 days. Nausea. History of COPD and CHF.  EXAM: CT ABDOMEN AND PELVIS WITH CONTRAST  TECHNIQUE: Multidetector CT imaging of the abdomen and pelvis was performed using the standard protocol following bolus administration of intravenous contrast.  CONTRAST:  25mL OMNIPAQUE IOHEXOL 300 MG/ML SOLN, 100mL OMNIPAQUE IOHEXOL 300 MG/ML SOLN  [facility]  [facility]  FINDINGS: Mild dependent atelectasis or fibrosis in the lung bases.  Diffuse fatty infiltration of the liver. No focal liver lesions identified. The gallbladder, spleen, pancreas, adrenal glands, kidneys, abdominal aorta, inferior vena cava, and retroperitoneal lymph nodes are unremarkable. Stomach is decompressed, limiting evaluation of the gastric wall. There may be gastric wall thickening suggesting gastritis. Clinical correlation is recommended. Visualized small bowel are unremarkable. Under distention limits evaluation. Diffusely stool-filled colon without abnormal distention or wall thickening appreciated. No free air or free  fluid in the abdomen. Abdominal wall musculature appears intact.  Pelvis: Surgical absence of the appendix. Uterus and ovaries are not enlarged. Bladder wall is not thickened. No free or loculated pelvic fluid collections. No pelvic mass or lymphadenopathy. Normal alignment of the lumbar spine. No destructive bone lesions appreciated.  IMPRESSION: Diffuse fatty infiltration of the liver. Possible gastric wall thickening which may suggest gastritis although under distention limits evaluation. No evidence of small bowel obstruction.   Electronically Signed   By: Burman NievesWilliam  Stevens M.D.   On: 12/13/2014 23:17   Dg Chest Port 1 View  12/14/2014   CLINICAL DATA:  COPD, congestive heart failure  EXAM: PORTABLE CHEST - 1 VIEW  COMPARISON:  08/17/2014  FINDINGS: Normal mediastinum and cardiac silhouette. Normal pulmonary vasculature. No evidence of effusion, infiltrate, or pneumothorax. No acute bony abnormality.  IMPRESSION: No acute cardiopulmonary process.   Electronically Signed   By: Genevive BiStewart  Edmunds M.D.   On: 12/14/2014 03:02     Assessment/Plan Principal Problem:   Abdominal pain Active Problems:   OSA on BiPAP   Chronic right-sided CHF (congestive heart failure)   Pulmonary hypertension   Fever   1. Abdominal pain mostly in the epigastric area - probably secondary to gastritis. Patient has been placed on protonic 40 mg IV every 12 hourly. If pain does not improve then may need GI consult. Other differentials include gastroparesis and also biliary dyskinesia. 2. Fever - so as not clear. Patient was initially febrile but improved in the ER. Influenza PCR has been ordered. UA and chest x-ray unremarkable for cause of fever. 3. Chronic right-sided heart failure - appears compensated. On Demadex. 4. Pulmonary hypertension - not in respiratory distress. 5. COPD - presently not wheezing.   DVT Prophylaxis Lovenox.  Code Status: Full code.  Family Communication: None.  Disposition Plan: Admit to  inpatient.    Derian Pfost N. [facility] Pager [phone].  If 7PM-7AM, please contact night-coverage [email or web address] Password [email or web address] 12/14/2014, 4:14 AM

## 2014-12-14 NOTE — Progress Notes (Signed)
Pt placed on Auto BIPAP 10-20 CMH20 with 2 LPM O2 bleed in via FFM per home regimen.  Pt tolerating well at this time, RT to monitor and assess as needed.

## 2014-12-15 DIAGNOSIS — R1013 Epigastric pain: Secondary | ICD-10-CM

## 2014-12-15 MED ORDER — PANTOPRAZOLE SODIUM 40 MG PO TBEC
40.0000 mg | DELAYED_RELEASE_TABLET | Freq: Two times a day (BID) | ORAL | Status: DC
  Administered 2014-12-15 – 2014-12-18 (×6): 40 mg via ORAL
  Filled 2014-12-15 (×9): qty 1

## 2014-12-15 NOTE — Progress Notes (Signed)
Key Points: Use following P&T approved IV to PO non-antibiotic change policy.  Description contains the criteria that are approved Note: Policy Excludes:  Esophagectomy patientsPHARMACIST - PHYSICIAN COMMUNICATION DR:   Blake DivineAkula CONCERNING: IV to Oral Route Change Policy  RECOMMENDATION: This patient is receiving protonix by the intravenous route.  Based on criteria approved by the Pharmacy and Therapeutics Committee, the intravenous medication(s) is/are being converted to the equivalent oral dose form(s).   DESCRIPTION: These criteria include:  The patient is eating (either orally or via tube) and/or has been taking other orally administered medications for a least 24 hours  The patient has no evidence of active gastrointestinal bleeding or impaired GI absorption (gastrectomy, short bowel, patient on TNA or NPO).  If you have questions about this conversion, please contact the Pharmacy Department  []   [phone]( [phone] )  [facility] []   [phone]  [facility]  []   [phone]( [phone] )  [facility] [x]   [phone]( [phone] )  [facility]  Earl ManyLegge, Quran Vasco ColomaMarshall, [facility] CenterRPH 12/15/2014 10:31 AM

## 2014-12-15 NOTE — Progress Notes (Signed)
[facility] PROGRESS NOTE  Mary HakimMichelle L [name] JYN:[id number] DOB: 12/01/1976 DOA: 12/13/2014 PCP: Elvina SidleLAUENSTEIN,KURT, MD  Assessment/Plan: Abdominal Pain: - probably from gastritis. Pain on eating, getting a NM gastric emptying study in am. Suspect gastroparesis. NPO after midnight.    Fever : resolved.  No source of infection.   Pulmonary hypertension: Stable.   COPD:  Stable , not wheezing.   Code Status: full code Family Communication: none at bedside Disposition Plan: pending.    Consultants:  None.   Procedures:  none  Antibiotics:  none  HPI/Subjective:   Objective: Filed Vitals:   12/15/14 1500  BP: 102/61  Pulse: 75  Temp: 98.7 F (37.1 C)  Resp: 16    Intake/Output Summary (Last 24 hours) at 12/15/14 1847 Last data filed at 12/15/14 1400  Gross per 24 hour  Intake    480 ml  Output      0 ml  Net    480 ml   Filed Weights   12/14/14 0300  Weight: 86.1 kg (189 lb 13.1 oz)    Exam:   General:  Alert afebrile comfortable  Cardiovascular: s1s2  Respiratory: ctab  Abdomen: soft obese, non tender non distended bowel sounds heard  Musculoskeletal: trace pedal edema.   Data Reviewed: Basic Metabolic Panel:  Recent Labs Lab 12/10/14 1827 12/13/14 1858 12/14/14 0520  NA 135 136 135  K 4.2 4.1 3.8  CL 99 96 97  CO2 28 30 29   GLUCOSE 118* 106* 84  BUN 6 8 7   CREATININE 0.53 0.57 0.60  CALCIUM 9.1 8.9 8.3*   Liver Function Tests:  Recent Labs Lab 12/10/14 1827 12/13/14 1858 12/14/14 0520  AST 75* 53* 44*  ALT 66* 72* 55*  ALKPHOS 72 77 68  BILITOT 0.6 1.2 1.4*  PROT 6.8 7.2 6.2  ALBUMIN 3.7 3.7 3.3*    Recent Labs Lab 12/10/14 1827 12/13/14 1858 12/14/14 0520  LIPASE 28 18 25    No results for input(s): AMMONIA in the last 168 hours. CBC:  Recent Labs Lab 12/10/14 1827 12/13/14 1858 12/14/14 0520  WBC 8.6 12.2* 9.8  NEUTROABS 6.4 9.5* 6.8  HGB 14.8 14.2 13.0  HCT 46.0 44.2 42.3  MCV 96.2 98.2 99.5   PLT 151 182 171   Cardiac Enzymes:  Recent Labs Lab 12/14/14 0520  TROPONINI <0.03   BNP (last 3 results)  Recent Labs  01/13/14 1237  PROBNP 185.2*   CBG: No results for input(s): GLUCAP in the last 168 hours.  No results found for this or any previous visit (from the past 240 hour(s)).   Studies: Ct Abdomen Pelvis W Contrast  12/13/2014   CLINICAL DATA:  Mid abdominal and epigastric pain for 2 days. Nausea. History of COPD and CHF.  EXAM: CT ABDOMEN AND PELVIS WITH CONTRAST  TECHNIQUE: Multidetector CT imaging of the abdomen and pelvis was performed using the standard protocol following bolus administration of intravenous contrast.  CONTRAST:  25mL OMNIPAQUE IOHEXOL 300 MG/ML SOLN, 100mL OMNIPAQUE IOHEXOL 300 MG/ML SOLN  COMPAR[facility]:  [facility]  FINDINGS: Mild dependent atelectasis or fibrosis in the lung bases.  Diffuse fatty infiltration of the liver. No focal liver lesions identified. The gallbladder, spleen, pancreas, adrenal glands, kidneys, abdominal aorta, inferior vena cava, and retroperitoneal lymph nodes are unremarkable. Stomach is decompressed, limiting evaluation of the gastric wall. There may be gastric wall thickening suggesting gastritis. Clinical correlation is recommended. Visualized small bowel are unremarkable. Under distention limits evaluation. Diffusely stool-filled colon without abnormal  distention or wall thickening appreciated. No free air or free fluid in the abdomen. Abdominal wall musculature appears intact.  Pelvis: Surgical absence of the appendix. Uterus and ovaries are not enlarged. Bladder wall is not thickened. No free or loculated pelvic fluid collections. No pelvic mass or lymphadenopathy. Normal alignment of the lumbar spine. No destructive bone lesions appreciated.  IMPRESSION: Diffuse fatty infiltration of the liver. Possible gastric wall thickening which may suggest gastritis although under distention limits evaluation. No  evidence of small bowel obstruction.   Electronically Signed   By: Burman Nieves M.D.   On: 12/13/2014 23:17   Dg Chest Port 1 View  12/14/2014   CLINICAL DATA:  COPD, congestive heart failure  EXAM: PORTABLE CHEST - 1 VIEW  COMPARISON:  08/17/2014  FINDINGS: Normal mediastinum and cardiac silhouette. Normal pulmonary vasculature. No evidence of effusion, infiltrate, or pneumothorax. No acute bony abnormality.  IMPRESSION: No acute cardiopulmonary process.   Electronically Signed   By: Genevive Bi M.D.   On: 12/14/2014 03:02    Scheduled Meds: . enoxaparin (LOVENOX) injection  40 mg Subcutaneous Q24H  . mometasone-formoterol  2 puff Inhalation BID  . pantoprazole  40 mg Oral BID  . sodium chloride  3 mL Intravenous Q12H  . tiotropium  18 mcg Inhalation Daily  . torsemide  20 mg Oral Daily   Continuous Infusions:   Principal Problem:   Abdominal pain Active Problems:   OSA on BiPAP   Chronic right-sided CHF (congestive heart failure)   Pulmonary hypertension   Fever    Time spent: 25 minutes.     [facility]  [facility] Pager [phone]. If 7PM-7AM, please contact night-coverage at [email or web address], password [facility] 12/15/2014, 6:47 PM  LOS: 2 days

## 2014-12-16 ENCOUNTER — Inpatient Hospital Stay (HOSPITAL_COMMUNITY): Payer: 59

## 2014-12-16 LAB — HEMOGLOBIN A1C
Hgb A1c MFr Bld: 5.2 % (ref <5.7)
Mean Plasma Glucose: 103 mg/dL (ref <117)

## 2014-12-16 MED ORDER — TECHNETIUM TC 99M SULFUR COLLOID
1.9000 | Freq: Once | INTRAVENOUS | Status: AC | PRN
  Administered 2014-12-16: 1.9 via INTRAVENOUS

## 2014-12-16 MED ORDER — METOCLOPRAMIDE HCL 5 MG/ML IJ SOLN
5.0000 mg | Freq: Four times a day (QID) | INTRAMUSCULAR | Status: DC
Start: 2014-12-16 — End: 2014-12-18
  Administered 2014-12-16 – 2014-12-18 (×9): 5 mg via INTRAVENOUS
  Filled 2014-12-16 (×4): qty 1
  Filled 2014-12-16: qty 2
  Filled 2014-12-16 (×2): qty 1
  Filled 2014-12-16: qty 2
  Filled 2014-12-16: qty 1
  Filled 2014-12-16 (×2): qty 2
  Filled 2014-12-16: qty 1

## 2014-12-16 NOTE — Progress Notes (Signed)
RT placed pt on auto titrate Bipap 10-20cmH2O with 2Lpm of oxygen bled in via ffm. Pt states she is comfortable and is tolerating Bipap well at this time. RT will continue to monitor as needed.

## 2014-12-16 NOTE — Progress Notes (Signed)
[facility] PROGRESS NOTE  JENNISE BOTH ZOX:[id number] DOB: 19-Mar-1976 DOA: 12/13/2014 PCP: Elvina Sidle, MD  Assessment/Plan: Abdominal Pain: - probably from gastritis. Pain on eating,  NM gastric emptying study is abnormal. Suspect gastroparesis. Started on IV reglan Continue with protonix. If pain does n't improve by am, will call gastroenterology for recommendations.    Fever : resolved.  No source of infection.   Pulmonary hypertension: Stable.   COPD:  Stable , not wheezing.   Code Status: full code Family Communication: none at bedside Disposition Plan: pending.    Consultants:  None.   Procedures:  NM GASTRIC EMPTYING STUDY.   Antibiotics:  none  HPI/Subjective: Pain slightly improved.   Objective: Filed Vitals:   12/16/14 1316  BP: 109/71  Pulse: 76  Temp: 98.1 F (36.7 C)  Resp: 18    Intake/Output Summary (Last 24 hours) at 12/16/14 1840 Last data filed at 12/16/14 1318  Gross per 24 hour  Intake    243 ml  Output      0 ml  Net    243 ml   Filed Weights   12/14/14 0300  Weight: 86.1 kg (189 lb 13.1 oz)    Exam:   General:  Alert afebrile comfortable  Cardiovascular: s1s2  Respiratory: ctab  Abdomen: soft obese, non tender non distended bowel sounds heard  Musculoskeletal: trace pedal edema.   Data Reviewed: Basic Metabolic Panel:  Recent Labs Lab 12/10/14 1827 12/13/14 1858 12/14/14 0520  NA 135 136 135  K 4.2 4.1 3.8  CL 99 96 97  CO2 GLUCOSE 118* 106* 84  BUN CREATININE 0.53 0.57 0.60  CALCIUM 9.1 8.9 8.3*   Liver Function Tests:  Recent Labs Lab 12/10/14 1827 12/13/14 1858 12/14/14 0520  AST 75* 53* 44*  ALT 66* 72* 55*  ALKPHOS 72 77 68  BILITOT 0.6 1.2 1.4*  PROT 6.8 7.2 6.2  ALBUMIN 3.7 3.7 3.3*    Recent Labs Lab 12/10/14 1827 12/13/14 1858 12/14/14 0520  LIPASE No results for input(s): AMMONIA in the last 168 hours. CBC:  Recent Labs Lab  12/10/14 1827 12/13/14 1858 12/14/14 0520  WBC 8.6 12.2* 9.8  NEUTROABS 6.4 9.5* 6.8  HGB 14.8 14.2 13.0  HCT 46.0 44.2 42.3  MCV 96.2 98.2 99.5  PLT 151 182 171   Cardiac Enzymes:  Recent Labs Lab 12/14/14 0520  TROPONINI <0.03   BNP (last 3 results)  Recent Labs  01/13/14 1237  PROBNP 185.2*   CBG: No results for input(s): GLUCAP in the last 168 hours.  No results found for this or any previous visit (from the past 240 hour(s)).   Studies: Nm Gastric Emptying  12/16/2014   CLINICAL DATA:  Abdominal pain, nausea, and early satiety.  EXAM: NUCLEAR MEDICINE GASTRIC EMPTYING SCAN  TECHNIQUE: After oral ingestion of radiolabeled meal, sequential abdominal images were obtained for 120 minutes. Residual percentage of activity remaining within the stomach was calculated at 60 and 120 minutes.  RADIOPHARMACEUTICALS:  1.9 mCi Technetium 99-m labeled sulfur colloid in 1 egg on toast with 4 oz of water  COMPARISON:  CT scan of the abdomen and pelvis of December 13, 2014  FINDINGS: Expected location of the stomach in the left upper quadrant. Ingested meal empties the stomach gradually over the course of the study with 100%% retention at 60 min and 100%% retention at 120 min (normal retention less than 30% at a 120 min).  IMPRESSION: No significant emptying of the gastric contents is observed over the 2 hr. This is consistent with gastro- paresis or gastric outlet obstruction.   Electronically Signed   By: David  [place]azil[place]Jordan   On: 12/16/2014 10:58    Scheduled Meds: . enoxaparin (LOVENOX) injection  40 mg Subcutaneous Q24H  . metoCLOPramide (REGLAN) injection  5 mg Intravenous 4 times per day  . mometasone-formoterol  2 puff Inhalation BID  . pantoprazole  40 mg Oral BID  . sodium chloride  3 mL Intravenous Q12H  . tiotropium  18 mcg Inhalation Daily  . torsemide  20 mg Oral Daily   Continuous Infusions:   Principal Problem:   Abdominal pain Active Problems:   OSA on BiPAP   Chronic  right-sided CHF (congestive heart failure)   Pulmonary hypertension   Fever    Time spent: 25 minutes.     [facility] Health[facility]ULA,Niajah Sipos  [facility] Pager [phone]. If 7PM-7AM, please contact night-coverage at [email or web address], password [email or web address] 12/16/2014, 6:40 PM  LOS: 3 days

## 2014-12-17 MED ORDER — POLYETHYLENE GLYCOL 3350 17 G PO PACK
17.0000 g | PACK | Freq: Two times a day (BID) | ORAL | Status: DC
  Administered 2014-12-17 – 2014-12-18 (×2): 17 g via ORAL

## 2014-12-17 NOTE — Progress Notes (Signed)
[facility] PROGRESS NOTE  Mary HakimMichelle L Jurewicz ONG:[id number]:[id number] DOB: 01/04/1976 DOA: 12/13/2014 PCP: Elvina SidleLAUENSTEIN,KURT, MD Interim summary: Mary [name] is a 39 y.o. female history of chronic right heart failure, pulmonary hypertension and COPD and possible cirrhosis of the liver present to the ER because of increasing abdominal pain over the last 6 days. Patient's pain is mostly in the epigastric area with nausea , occurs with eating pain.  Gastric emptying study was ordered and it showed gastroparesis.  Assessment/Plan: Abdominal Pain: - probably from gastritis and gastroparesis. Pain on eating,  NM gastric emptying study is abnormal. Suspect gastroparesis. Started on IV reglan. Continue with protonix and gastroenterology consulted for persistent pain.    Fever : resolved.  No source of infection.   Pulmonary hypertension: Stable.   COPD:  Stable , not wheezing.   Mild elevated liver function tests: Stable monitor.   Code Status: full code Family Communication: none at bedside Disposition Plan: pending.    Consultants:  Gastroenterology.  Procedures:  NM GASTRIC EMPTYING STUDY.   Antibiotics:  none  HPI/Subjective: Pain slightly improved on reglan.  Objective: Filed Vitals:   12/17/14 1359  BP: 112/79  Pulse: 79  Temp: 97.9 F (36.6 C)  Resp: 18    Intake/Output Summary (Last 24 hours) at 12/17/14 1627 Last data filed at 12/17/14 1359  Gross per 24 hour  Intake   1200 ml  Output      0 ml  Net   1200 ml   Filed Weights   12/14/14 0300  Weight: 86.1 kg (189 lb 13.1 oz)    Exam:   General:  Alert afebrile comfortable  Cardiovascular: s1s2  Respiratory: ctab  Abdomen: soft obese, mild tenderness in the epigastric area, non distended bowel sounds heard  Musculoskeletal: trace pedal edema.   Data Reviewed: Basic Metabolic Panel:  Recent Labs Lab 12/10/14 1827 12/13/14 1858 12/14/14 0520  NA 135 136 135  K 4.2 4.1 3.8  CL 99 96  97  CO2 28 30 29   GLUCOSE 118* 106* 84  BUN 6 8 7   CREATININE 0.53 0.57 0.60  CALCIUM 9.1 8.9 8.3*   Liver Function Tests:  Recent Labs Lab 12/10/14 1827 12/13/14 1858 12/14/14 0520  AST 75* 53* 44*  ALT 66* 72* 55*  ALKPHOS 72 77 68  BILITOT 0.6 1.2 1.4*  PROT 6.8 7.2 6.2  ALBUMIN 3.7 3.7 3.3*    Recent Labs Lab 12/10/14 1827 12/13/14 1858 12/14/14 0520  LIPASE 28 18 25    No results for input(s): AMMONIA in the last 168 hours. CBC:  Recent Labs Lab 12/10/14 1827 12/13/14 1858 12/14/14 0520  WBC 8.6 12.2* 9.8  NEUTROABS 6.4 9.5* 6.8  HGB 14.8 14.2 13.0  HCT 46.0 44.2 42.3  MCV 96.2 98.2 99.5  PLT 151 182 171   Cardiac Enzymes:  Recent Labs Lab 12/14/14 0520  TROPONINI <0.03   BNP (last 3 results)  Recent Labs  01/13/14 1237  PROBNP 185.2*   CBG: No results for input(s): GLUCAP in the last 168 hours.  No results found for this or any previous visit (from the past 240 hour(s)).   Studies: Nm Gastric Emptying  12/16/2014   CLINICAL DATA:  Abdominal pain, nausea, and early satiety.  EXAM: NUCLEAR MEDICINE GASTRIC EMPTYING SCAN  TECHNIQUE: After oral ingestion of radiolabeled meal, sequential abdominal images were obtained for 120 minutes. Residual percentage of activity remaining within the stomach was calculated at 60 and 120 minutes.  RADIOPHARMACEUTICALS:  1.9 mCi Technetium 99-m  labeled sulfur colloid in 1 egg on toast with 4 oz of water  COMPARISON:  CT scan of the abdomen and pelvis of December 13, 2014  FINDINGS: Expected location of the stomach in the left upper quadrant. Ingested meal empties the stomach gradually over the course of the study with 100%% retention at 60 min and 100%% retention at 120 min (normal retention less than 30% at a 120 min).  IMPRESSION: No significant emptying of the gastric contents is observed over the 2 hr. This is consistent with gastro- paresis or gastric outlet obstruction.   Electronically Signed   By: David  [place]    On: 12/16/2014 10:58    Scheduled Meds: . enoxaparin (LOVENOX) injection  40 mg Subcutaneous Q24H  . metoCLOPramide (REGLAN) injection  5 mg Intravenous 4 times per day  . mometasone-formoterol  2 puff Inhalation BID  . pantoprazole  40 mg Oral BID  . sodium chloride  3 mL Intravenous Q12H  . tiotropium  18 mcg Inhalation Daily  . torsemide  20 mg Oral Daily   Continuous Infusions:   Principal Problem:   Abdominal pain Active Problems:   OSA on BiPAP   Chronic right-sided CHF (congestive heart failure)   Pulmonary hypertension   Fever    Time spent: 25 minutes.     [facility]  [facility] Pager [phone]. If 7PM-7AM, please contact night-coverage at [email or web address], password [facility] 12/17/2014, 4:27 PM  LOS: 4 days

## 2014-12-17 NOTE — Consult Note (Signed)
Referring Provider: Dr .Blake Divine ([facility]) Primary Care Physician:  Elvina Sidle, MD Primary Gastroenterologist:  None (unassigned)  Reason for Consultation:  Epigastric pain of one week's duration.  HPI: Mary [name] is a 39 y.o. female with pulmonary hypertension and heart failure, on chronic home O2 and BiPAP at night, admitted recently with significant abdominal pain that initially got better on PPI therapy with Protonix, but then she had recurrent pain as she tried to eat.   A gastric emptying scan showed virtually 100% retention at 2 hours, despite the fact that she has been getting very rare doses of anti-kinetic medications such as Dilaudid.   Today, she is feeling better. She correlates this with having had a couple of good bowel movements.   She has had no problem with prodromal anorexia or weight loss, apart from perhaps the 5 days or so prior to admission when she sort of lost her appetite, but did not have any overt aversion to food.  She describes the pain as being like a "band" wrapped around her upper abdomen, with a pressure-like feeling, that then became concentrated in the more central epigastric area, associated with significant nausea but no vomiting. She has not had any associated fevers. There was no overt antecedent change in bowel habits, which for the patient consists of a daily, non-scyballous stool.  An abdominal pelvic CT scan prior to admission, which I have reviewed, showed quite a bit of stool in the colon, but with oral contrast making it at least into the small bowel, and without gastric distention, thereby going against gastric outlet obstruction as the source of the patient's symptoms. There was a question of some gastritis, but the stomach was under-distended, which tends to invalidate that observation.   Past Medical History  Diagnosis Date  . COPD (chronic obstructive pulmonary disease)     PFTS 03/08/12: fev1 0.58L/1%, FVC 1.18/33%, Ratop 49  and c/w ssevere obstruction. 21% BD response on FVC, RV 219%, DLCO 11/54%  . History of alcoholism   . Irregular menses   . DVT of upper extremity (deep vein thrombosis) April 2013    right subclavian // Unclear precipitating cause - possibly significant right heart failure, with vascular stasis potentially predisposing to clotting.    . Pulmonary embolus April 2013    Precipitating cause unclear. Was treated with coumadin from April-June 2013.  . Moderate to severe pulmonary hypertension April 2013    Cardiac cath on 03/06/12 - 1. Elevated pulmonary artery pressures, right sided filling pressures.,  2. PA: 64/45 (mean 53)    . Hepatic cirrhosis     Questionable history of - Noted on CT abdomen (03/2012) - thought to be due to vascular congestion from right heart failure +/- patient's history of alcohol abuse  . Axillary adenopathy     right axillary adenopathy noted on CT chest (03/06/2012)  . Periodontal disease   . Chronic right-sided CHF (congestive heart failure) 03/23/2012    with cor pulmonale. Last RHC 03/2012  . Pneumonia ~ 1985; 01/2011  . History of chronic bronchitis   . Exertional shortness of breath   . On home oxygen therapy     "2-3 L 24/7" (05/03/2013)  . OSA (obstructive sleep apnea)     wears noctural BiPAP (05/03/2013)  . Migraine     "~ 1/yr" (05/03/2013)    Past Surgical History  Procedure Laterality Date  . Cardiac surgery  1977-07-24    "my heart was backwards" (05/03/2013)  . Lung surgery  Jun 08, 1977  .  Appendectomy  05/03/2013  . Cardiac catheterization  03/2012  . Laparoscopic appendectomy N/A 05/03/2013    Procedure: APPENDECTOMY LAPAROSCOPIC;  Surgeon: Cherylynn RidgesJames O Wyatt, MD;  [facility];  Service: General;  Laterality: N/A;  . Right heart catheterization N/A 03/07/2012    Procedure: RIGHT HEART CATH;  Surgeon: Kathleene Hazelhristopher D McAlhany, MD;  [facility];  Service: Cardiovascular;  Laterality: N/A;    Prior to Admission medications   Medication Sig Start  Date End Date Taking? Authorizing Provider  albuterol (PROVENTIL HFA;VENTOLIN HFA) 108 (90 BASE) MCG/ACT inhaler Inhale 2 puffs into the lungs every 6 (six) hours as needed for wheezing or shortness of breath. 09/06/14  Yes Kalman ShanMurali Ramaswamy, MD  Fluticasone-Salmeterol (ADVAIR) 250-50 MCG/DOSE AEPB Inhale 1 puff into the lungs every 12 (twelve) hours. 09/27/14  Yes Kalman ShanMurali Ramaswamy, MD  ondansetron (ZOFRAN ODT) 4 MG disintegrating tablet Take 1 tablet (4 mg total) by mouth every 8 (eight) hours as needed for nausea. 12/10/14  Yes Garlon HatchetLisa M Sanders, PA-C  oxyCODONE-acetaminophen (PERCOCET/ROXICET) 5-325 MG per tablet Take 1 tablet by mouth every 4 (four) hours as needed. 12/10/14  Yes Garlon HatchetLisa M Sanders, PA-C  tiotropium (SPIRIVA) 18 MCG inhalation capsule Place 1 capsule (18 mcg total) into inhaler and inhale daily. 09/27/14  Yes Kalman ShanMurali Ramaswamy, MD  torsemide (DEMADEX) 20 MG tablet Take 1 tablet (20 mg total) by mouth daily. 09/10/14  Yes Lewayne BuntingBrian S Crenshaw, MD  VENTOLIN HFA 108 (90 BASE) MCG/ACT inhaler USE 2 PUFFS EVERY 6 HOURS AS NEEDED FOR WHEEZING 12/09/14   Kalman ShanMurali Ramaswamy, MD    Current Facility-Administered Medications  Medication Dose Route Frequency Provider Last Rate Last Dose  . acetaminophen (TYLENOL) tablet 650 mg  650 mg Oral Q6H PRN Eduard ClosArshad N Kakrakandy, MD       Or  . acetaminophen (TYLENOL) suppository 650 mg  650 mg Rectal Q6H PRN Eduard ClosArshad N Kakrakandy, MD      . albuterol (PROVENTIL) (2.5 MG/3ML) 0.083% nebulizer solution 2.5 mg  2.5 mg Nebulization Q6H PRN Eduard ClosArshad N Kakrakandy, MD   2.5 mg at 12/16/14 1033  . enoxaparin (LOVENOX) injection 40 mg  40 mg Subcutaneous Q24H Eduard ClosArshad N Kakrakandy, MD   40 mg at 12/17/14 0909  . HYDROmorphone (DILAUDID) injection 0.5 mg  0.5 mg Intravenous Q4H PRN Eduard ClosArshad N Kakrakandy, MD      . metoCLOPramide (REGLAN) injection 5 mg  5 mg Intravenous 4 times per day Kathlen ModyVijaya Akula, MD   5 mg at 12/17/14 1317  . mometasone-formoterol (DULERA) 100-5 MCG/ACT inhaler 2 puff   2 puff Inhalation BID Eduard ClosArshad N Kakrakandy, MD   2 puff at 12/17/14 1010  . ondansetron (ZOFRAN) tablet 4 mg  4 mg Oral Q6H PRN Eduard ClosArshad N Kakrakandy, MD       Or  . ondansetron [facility](ZOFRAN) injection 4 mg  4 mg Intravenous Q6H PRN Eduard ClosArshad N Kakrakandy, MD      . oxyCODONE-acetaminophen (PERCOCET/ROXICET) 5-325 MG per tablet 1 tablet  1 tablet Oral Q4H PRN Eduard ClosArshad N Kakrakandy, MD   1 tablet at 12/17/14 1315  . pantoprazole (PROTONIX) EC tablet 40 mg  40 mg Oral BID Berkley HarveyJustin Marshall Legge, RPH   40 mg at 12/17/14 [id number]  . sodium chloride 0.9 % injection 3 mL  3 mL Intravenous Q12H Eduard ClosArshad N Kakrakandy, MD   3 mL at 12/17/14 0910  . tiotropium (SPIRIVA) inhalation capsule 18 mcg  18 mcg Inhalation Daily Eduard ClosArshad N Kakrakandy, MD   18 mcg at 12/17/14 1009  .  torsemide (DEMADEX) tablet 20 mg  20 mg Oral Daily Eduard Clos, MD   20 mg at 12/17/14 0909    Allergies as of 12/13/2014  . (No Known Allergies)    Family History  Problem Relation Age of Onset  . Hypertension Father   . Heart disease Father     CHF; died age 17   . Alcohol abuse Father   . Other Brother     died age 45 y.o overdose    History   Social History  . Marital Status: Married    Spouse Name: N/A    Number of Children: 0  . Years of Education: college   Occupational History  . Unemployed     [employer]   Social History Main Topics  . Smoking status: Former Smoker -- 1.00 packs/day for 25 years    Types: Cigarettes    Quit date: 02/18/2012  . Smokeless tobacco: Never Used  . Alcohol Use: No     Comment: previously drinking 1 pint 3-4 days a week for 2002-2013, but in 2014, no longer drinking  . Drug Use: No  . Sexual Activity:    Partners: Male   Other Topics Concern  . Not on file   Social History Narrative   Lives with husband in [place]   Has a farm with multiple animals including chickens, [place] pigs, cockatil (bird), dogs and cats   Completed GED and 2 years of college   Smoking-quit smoking >1 year  since 2014   Not working     Review of Systems: The patient reports significant exertional dyspnea which limits her activity considerably. She does not have resting dyspnea or orthopnea. Bowel symptoms are per history of present illness, negative for dysphagia, reflux, chronic nausea or chronic abdominal pain, constipation, diarrhea, or rectal bleeding.  Physical Exam: Vital signs in last 24 hours: Temp:  [97.7 F (36.5 C)-97.9 F (36.6 C)] 97.9 F (36.6 C) (01/12 1359) Pulse Rate:  [64-79] 79 (01/12 1359) Resp:  [16-20] 18 (01/12 1359) BP: (98-113)/(58-79) 112/79 mmHg (01/12 1359) SpO2:  [92 %-96 %] 95 % (01/12 1359) Last BM Date: 12/16/14 General:   Alert,  Well-developed, well-nourished, somewhat overweight, pleasant and cooperative in NAD Head:  Normocephalic and atraumatic. Eyes:  Sclera clear, no icterus.   Conjunctiva pink. Mouth:   No ulcerations or lesions.  Oropharynx pink & moist. Neck:   No masses or thyromegaly. Lungs:  Clear throughout to auscultation.   No wheezes, crackles, or rhonchi. No evident respiratory distress. Heart:   Regular rate and rhythm; no murmurs, clicks, rubs,  or gallops. Abdomen:  Soft, nontender, nontympanitic, and nondistended. No masses, hepatosplenomegaly or ventral hernias noted. Normal bowel sounds, without bruits, guarding, or rebound.   Msk:   Symmetrical without gross deformities. Extremities:   Without clubbing, cyanosis, or edema. Neurologic:  Alert and coherent;  grossly normal neurologically. Skin:  Intact without significant lesions or rashes. Cervical Nodes:  No significant cervical adenopathy. Psych:   Alert and cooperative. Normal mood and affect.  Intake/Output from previous day: 01/11 0701 - 01/12 0700 In: 960 [P.O.:960] Out: -  Intake/Output this shift: Total I/O In: 480 [P.O.:480] Out: -   Lab Results: No results for input(s): WBC, HGB, HCT, PLT in the last 72 hours. BMET No results for input(s): NA, K, CL, CO2,  GLUCOSE, BUN, CREATININE, CALCIUM in the last 72 hours. LFT No results for input(s): PROT, ALBUMIN, AST, ALT, ALKPHOS, BILITOT, BILIDIR, IBILI in the last 72 hours. PT/INR  No results for input(s): LABPROT, INR in the last 72 hours.  Studies/Results: Nm Gastric Emptying  12/16/2014   CLINICAL DATA:  Abdominal pain, nausea, and early satiety.  EXAM: NUCLEAR MEDICINE GASTRIC EMPTYING SCAN  TECHNIQUE: After oral ingestion of radiolabeled meal, sequential abdominal images were obtained for 120 minutes. Residual percentage of activity remaining within the stomach was calculated at 60 and 120 minutes.  RADIOPHARMACEUTICALS:  1.9 mCi Technetium 99-m labeled sulfur colloid in 1 egg on toast with 4 oz of water  COMPARISON:  CT scan of the abdomen and pelvis of December 13, 2014  FINDINGS: Expected location of the stomach in the left upper quadrant. Ingested meal empties the stomach gradually over the course of the study with 100%% retention at 60 min and 100%% retention at 120 min (normal retention less than 30% at a 120 min).  IMPRESSION: No significant emptying of the gastric contents is observed over the 2 hr. This is consistent with gastro- paresis or gastric outlet obstruction.   Electronically Signed   By: David  [place]   On: 12/16/2014 10:58    Impression: 1. Upper abdominal nonspecific pain, possibly related to gastroparesis and intermittent gastric retention and/or fecal obstipation , improved as of the present time following defecation  2. Gastric emptying scan showing evident gastroparesis, presumably medication induced, since initial CT shows no evidence of anatomic gastric outlet obstruction  Plan: Miralax to help maintain defecation. Continue current management since the patient seems to be improving . Avoid endoscopy unless essential to patient's management (patient at high risk for sedation).   LOS: 4 days   Coran Dipaola V  12/17/2014, 4:22 PM

## 2014-12-17 NOTE — Progress Notes (Signed)
Pt stated that she would self administer BIPAP when ready for bed.  Current settings are Auto BIPAP 20/10 with 2 LPM O2 bleed in via FFM.  Pt notified to call if she needs any assistance. Rt to monitor and assess as needed.

## 2014-12-18 DIAGNOSIS — K219 Gastro-esophageal reflux disease without esophagitis: Secondary | ICD-10-CM | POA: Insufficient documentation

## 2014-12-18 DIAGNOSIS — K3184 Gastroparesis: Secondary | ICD-10-CM | POA: Insufficient documentation

## 2014-12-18 DIAGNOSIS — G4733 Obstructive sleep apnea (adult) (pediatric): Secondary | ICD-10-CM

## 2014-12-18 DIAGNOSIS — K5900 Constipation, unspecified: Secondary | ICD-10-CM | POA: Insufficient documentation

## 2014-12-18 DIAGNOSIS — J438 Other emphysema: Secondary | ICD-10-CM | POA: Insufficient documentation

## 2014-12-18 MED ORDER — ONDANSETRON 8 MG PO TBDP: 4.0000 mg | ORAL_TABLET | Freq: Three times a day (TID) | ORAL | Status: DC | PRN

## 2014-12-18 MED ORDER — OXYCODONE-ACETAMINOPHEN 5-325 MG PO TABS
1.0000 | ORAL_TABLET | Freq: Four times a day (QID) | ORAL | Status: DC | PRN
  Administered 2014-12-18: 1 via ORAL
  Filled 2014-12-18: qty 1

## 2014-12-18 MED ORDER — POLYETHYLENE GLYCOL 3350 17 G PO PACK: 17.0000 g | PACK | Freq: Two times a day (BID) | ORAL | Status: DC

## 2014-12-18 MED ORDER — METOCLOPRAMIDE HCL 5 MG PO TABS: 5.0000 mg | ORAL_TABLET | Freq: Four times a day (QID) | ORAL | Status: DC

## 2014-12-18 MED ORDER — PANTOPRAZOLE SODIUM 40 MG PO TBEC: 40.0000 mg | DELAYED_RELEASE_TABLET | Freq: Every day | ORAL | Status: DC

## 2014-12-18 MED ORDER — OXYCODONE-ACETAMINOPHEN 5-325 MG PO TABS: 1.0000 | ORAL_TABLET | Freq: Three times a day (TID) | ORAL | Status: DC | PRN

## 2014-12-18 NOTE — Progress Notes (Signed)
Pt to d/c home. AVS reviewed and "My Chart" discussed with pt. Pt capable of verbalizing medications, signs and symptoms of infection, and follow-up appointments. Discussed the importance of backing off the pain medication as soon as she could. Remains hemodynamically stable. No signs and symptoms of distress. Educated pt to return to ER in the case of SOB, dizziness, or chest pain.

## 2014-12-18 NOTE — Discharge Summary (Signed)
Physician Discharge Summary  RAMANDEEP ARINGTON ZOX:[id number] DOB: [birth year] DOA: 12/13/2014  PCP: Elvina Sidle, MD  Admit date: 12/13/2014 Discharge date: 12/18/2014  Time spent: >30 minutes  Recommendations for Outpatient Follow-up:  CMET to follow electrolytes, renal function and LFT's Please assess symptoms and need to adjust reglan further Patient to follow with GI in outpatient setting  Discharge Diagnoses:  Abdominal pain and nausea due to gastroparesis  OSA on BiPAP Chronic right-sided CHF (congestive heart failure) Pulmonary hypertension Fever GERD Obstipation   Discharge Condition: stable and improved. Will follow with PCP and GI as an outpatient  Diet recommendation: heart healthy diet. Multiple meals (small amount)  Filed Weights   12/14/14 0300  Weight: 86.1 kg (189 lb 13.1 oz)    History of present illness:  39 y.o. female history of chronic right heart failure, pulmonary hypertension and COPD and possible cirrhosis of the liver present to the ER because of increasing abdominal pain over the last 6 days. Patient's pain is mostly in the epigastric area with nausea denies any vomiting or diarrhea. Pain increases on eating and crampy in nature. In the ER patient required multiple doses of pain relief medications. CT abdomen and pelvis done shows possible gastritis. Labs are largely unremarkable except the patient had mild leukocytosis and UA was showing ketosis. Since patient is still having pain and unable to take in anything patient has been admitted for further management. Patient denies any chest pain or shortness of breath. In the ER patient has had a mild fever.  Hospital Course:  Abdominal Pain: -Presumed to be secondary to obstipation, GERD and gastroparesis -improved with reglan and use of miralax -patient advise of lifestyle changes for small multiple meals per day and to minimize use of narcotics -will follow with GI as an outpatient -will continue  PPI  Fever : resolved.  -No source of infection identified.  -no antibiotics given  OSA with Pulmonary hypertension and right heart failure: -Stable. -continue demadex  -Continue QHS CPAP -advise to be compliant with low sodium heart healthy diet  Chronic resp failure due to COPD:  -Stable , not wheezing at discharge -Continue PRN albuterol and the use of advair and Spiriva -continue oxygen supplementation   Mild elevated liver function tests: Stable monitor.   GERD and obstipation: as mentioned above. -will discharge on miralax and PPI     Procedures:  Gastric emptying study: positive for gastroparesis  Consultations:  GI   Discharge Exam: Filed Vitals:   12/18/14 0603  BP: 121/72  Pulse: 70  Temp: 98.1 F (36.7 C)  Resp: 19    General: afebrile, feeling a lot better; no vomiting and just mild pain in her mid abd. Having good BM's Cardiovascular: S1 and S2, no rubs or gallops Respiratory: CTA bilaterally Abd: soft, ND, positive BS; slight tenderness with deep palpation Extremities: trace edema bilaterally   Discharge Instructions   Discharge Instructions    Diet - low sodium heart healthy    Complete by:  As directed      Discharge instructions    Complete by:  As directed   Follow a low sodium diet Use your CPAP every night Follow small multiple meals per day Call GI office to confirmed follow up appointment Arrange follow up with PCP in 2 weeks          Current Discharge Medication List    START taking these medications   Details  metoCLOPramide (REGLAN) 5 MG tablet Take 1 tablet (5 mg total) by mouth  4 (four) times daily. Qty: 120 tablet, Refills: 0    pantoprazole (PROTONIX) 40 MG tablet Take 1 tablet (40 mg total) by mouth daily. Qty: 30 tablet, Refills: 1    polyethylene glycol (MIRALAX / GLYCOLAX) packet Take 17 g by mouth 2 (two) times daily. Qty: 28 each, Refills: 1      CONTINUE these medications which have CHANGED    Details  ondansetron (ZOFRAN-ODT) 8 MG disintegrating tablet Take 0.5 tablets (4 mg total) by mouth every 8 (eight) hours as needed for nausea. Qty: 20 tablet, Refills: 0    oxyCODONE-acetaminophen (PERCOCET/ROXICET) 5-325 MG per tablet Take 1 tablet by mouth every 8 (eight) hours as needed for severe pain. Qty: 30 tablet, Refills: 0      CONTINUE these medications which have NOT CHANGED   Details  albuterol (PROVENTIL HFA;VENTOLIN HFA) 108 (90 BASE) MCG/ACT inhaler Inhale 2 puffs into the lungs every 6 (six) hours as needed for wheezing or shortness of breath. Qty: 8.5 g, Refills: 1    Fluticasone-Salmeterol (ADVAIR) 250-50 MCG/DOSE AEPB Inhale 1 puff into the lungs every 12 (twelve) hours. Qty: 60 each, Refills: 6    tiotropium (SPIRIVA) 18 MCG inhalation capsule Place 1 capsule (18 mcg total) into inhaler and inhale daily. Qty: 30 capsule, Refills: 5    torsemide (DEMADEX) 20 MG tablet Take 1 tablet (20 mg total) by mouth daily. Qty: 90 tablet, Refills: 3   Associated Diagnoses: Chronic right-sided CHF (congestive heart failure)       No Known Allergies Follow-up Information    Follow up with Elvina SidleLAUENSTEIN,KURT, MD. Schedule an appointment as soon as possible for a visit in 2 weeks.   Specialty:  Family Medicine   Contact information:   [address] [place] [address] [phone]       Follow up with Florencia ReasonsBUCCINI,ROBERT V, MD.   Specialty:  Gastroenterology   Why:  call office in 2-3 weeks in order to confirmed follow up appointment   Contact information:   [address] [place] [address] [phone]       The results of significant diagnostics from this hospitalization (including imaging, microbiology, ancillary and laboratory) are listed below for reference.    Significant Diagnostic Studies: Nm Gastric Emptying  12/16/2014   CLINICAL DATA:  Abdominal pain, nausea, and early satiety.  EXAM: NUCLEAR MEDICINE GASTRIC EMPTYING SCAN  TECHNIQUE:  After oral ingestion of radiolabeled meal, sequential abdominal images were obtained for 120 minutes. Residual percentage of activity remaining within the stomach was calculated at 60 and 120 minutes.  RADIOPHARMACEUTICALS:  1.9 mCi Technetium 99-m labeled sulfur colloid in 1 egg on toast with 4 oz of water  COMPARISON:  CT scan of the abdomen and pelvis of December 13, 2014  FINDINGS: Expected location of the stomach in the left upper quadrant. Ingested meal empties the stomach gradually over the course of the study with 100%% retention at 60 min and 100%% retention at 120 min (normal retention less than 30% at a 120 min).  IMPRESSION: No significant emptying of the gastric contents is observed over the 2 hr. This is consistent with gastro- paresis or gastric outlet obstruction.   Electronically Signed   By: David  [place]   On: 12/16/2014 10:58   [place] Abdomen Complete  12/10/2014   CLINICAL DATA:  Epigastric pain. Pain for 3 days, now worsening. History of cirrhosis and alcohol use.  EXAM: ULTRASOUND ABDOMEN COMPLETE  COMPARISON:  09/30/2014  FINDINGS: Gallbladder: No gallstones or wall  thickening visualized. No sonographic Murphy sign noted.  Common bile duct: Diameter: 6.3 mm, normal  Liver: Diffusely increased hepatic parenchymal echotexture consistent with diffuse fatty infiltration.  IVC: Not visualized due to overlying bowel gas.  Pancreas: Not visualized due to overlying bowel gas.  Spleen: Spleen appears mildly enlarged, measuring 12.2 cm length. Volume is calculated at 347 mL. No focal lesions.  Right Kidney: Length: 11.9 cm. Echogenicity within normal limits. No mass or hydronephrosis visualized.  Left Kidney: Length: 10.9 cm. Echogenicity within normal limits. No mass or hydronephrosis visualized.  Abdominal aorta: Limited visualization.  No aneurysm identified.  Other findings: None.  IMPRESSION: Diffuse fatty infiltration of the liver.  Mild splenic enlargement.   Electronically Signed   By: Burman Nieves M.D.   On: 12/10/2014 23:14   Ct Abdomen Pelvis W Contrast  12/13/2014   CLINICAL DATA:  Mid abdominal and epigastric pain for 2 days. Nausea. History of COPD and CHF.  EXAM: CT ABDOMEN AND PELVIS WITH CONTRAST  TECHNIQUE: Multidetector CT imaging of the abdomen and pelvis was performed using the standard protocol following bolus administration of intravenous contrast.  CONTRAST:  25mL OMNIPAQUE IOHEXOL 300 MG/ML SOLN, OMNIPAQUE IOHEXOL 300 MG/ML SOLN  COMPARISON:  05/03/2013 from [facility].  FINDINGS: Mild dependent atelectasis or fibrosis in the lung bases.  Diffuse fatty infiltration of the liver. No focal liver lesions identified. The gallbladder, spleen, pancreas, adrenal glands, kidneys, abdominal aorta, inferior vena cava, and retroperitoneal lymph nodes are unremarkable. Stomach is decompressed, limiting evaluation of the gastric wall. There may be gastric wall thickening suggesting gastritis. Clinical correlation is recommended. Visualized small bowel are unremarkable. Under distention limits evaluation. Diffusely stool-filled colon without abnormal distention or wall thickening appreciated. No free air or free fluid in the abdomen. Abdominal wall musculature appears intact.  Pelvis: Surgical absence of the appendix. Uterus and ovaries are not enlarged. Bladder wall is not thickened. No free or loculated pelvic fluid collections. No pelvic mass or lymphadenopathy. Normal alignment of the lumbar spine. No destructive bone lesions appreciated.  IMPRESSION: Diffuse fatty infiltration of the liver. Possible gastric wall thickening which may suggest gastritis although under distention limits evaluation. No evidence of small bowel obstruction.   Electronically Signed   By: Burman Nieves M.D.   On: 12/13/2014 23:17   Dg Chest Port 1 View  12/14/2014   CLINICAL DATA:  COPD, congestive heart failure  EXAM: PORTABLE CHEST - 1 VIEW  COMPARISON:  08/17/2014  FINDINGS: Normal mediastinum  and cardiac silhouette. Normal pulmonary vasculature. No evidence of effusion, infiltrate, or pneumothorax. No acute bony abnormality.  IMPRESSION: No acute cardiopulmonary process.   Electronically Signed   By: Genevive Bi M.D.   On: 12/14/2014 03:02    Labs: Basic Metabolic Panel:  Recent Labs Lab 12/13/14 1858 12/14/14 0520  NA 136 135  K 4.1 3.8  CL 96 97  CO2 30 29  GLUCOSE 106* 84  BUN 8 7  CREATININE 0.57 0.60  CALCIUM 8.9 8.3*   Liver Function Tests:  Recent Labs Lab 12/13/14 1858 12/14/14 0520  AST 53* 44*  ALT 72* 55*  ALKPHOS 77 68  BILITOT 1.2 1.4*  PROT 7.2 6.2  ALBUMIN 3.7 3.3*    Recent Labs Lab 12/13/14 1858 12/14/14 0520  LIPASE 18 25   CBC:  Recent Labs Lab 12/13/14 1858 12/14/14 0520  WBC 12.2* 9.8  NEUTROABS 9.5* 6.8  HGB 14.2 13.0  HCT 44.2 42.3  MCV 98.2 99.5  PLT  182 171   Cardiac Enzymes:  Recent Labs Lab 12/14/14 0520  TROPONINI <0.03   BNP: BNP (last 3 results)  Recent Labs  01/13/14 1237  PROBNP 185.2*    Signed:  Vassie Loll  [facility] 12/18/2014, 12:37 PM

## 2014-12-24 ENCOUNTER — Telehealth: Payer: Self-pay | Admitting: Internal Medicine

## 2014-12-24 ENCOUNTER — Other Ambulatory Visit: Payer: Self-pay

## 2014-12-24 NOTE — Telephone Encounter (Signed)
Pt called to say her dog chewed her CPAP mask and [facility] needs a new rx for one from pt's PCP. Patient wants to know if someone can do this today and call her back today as her phone will be getting shut off tomorrow.    [facility] on [address] needs the rx.   PT CB # [phone]

## 2014-12-24 NOTE — Telephone Encounter (Signed)
Called and spoke to pt. Pt is requesting CPAP mask. Informed pt she will need to contact her PCP for her sleep issues (pt does not have sleep doc). Pt verbalized understanding and denied any further questions or concerns at this time.

## 2014-12-26 NOTE — Telephone Encounter (Signed)
Can we get this patient a mask as soon as possible?

## 2015-01-02 NOTE — Telephone Encounter (Signed)
Dr Dareen PianoAnderson, [place] DecSara [facility] but [facility]aged back [facility]. Pulmonologist denied ordering, advising pt that she needs to get PCP to order. Dr L is listed as her PCP but he hasn't seen pt w/in last couple of years. You most recently saw pt on 03/21/14 and discussed insomnia. There is a sleep study scanned into EPIC under media tab 04/04/12. Can we order or pt need to RTC first? Pended.

## 2015-01-02 NOTE — Telephone Encounter (Signed)
Called pt and she agreed to RTC to see someone here. Sleep Study was done by someone in hospital, and pulmonologist advised she has to get this from PCP. Transferred pt to appt center and she will come to walk -in if too long until appt available.

## 2015-01-03 ENCOUNTER — Ambulatory Visit: Payer: 59 | Admitting: Internal Medicine

## 2015-01-13 ENCOUNTER — Emergency Department (HOSPITAL_COMMUNITY): Payer: 59

## 2015-01-13 ENCOUNTER — Inpatient Hospital Stay (HOSPITAL_COMMUNITY)
Admission: EM | Admit: 2015-01-13 | Discharge: 2015-01-15 | DRG: 291 | Disposition: A | Discharge status: Home / Self Care | Payer: 59 | Attending: Internal Medicine | Admitting: Internal Medicine

## 2015-01-13 ENCOUNTER — Encounter (HOSPITAL_COMMUNITY): Payer: Self-pay | Admitting: Emergency Medicine

## 2015-01-13 ENCOUNTER — Encounter: Payer: Self-pay | Admitting: Family Medicine

## 2015-01-13 DIAGNOSIS — Z811 Family history of alcohol abuse and dependence: Secondary | ICD-10-CM | POA: Diagnosis not present

## 2015-01-13 DIAGNOSIS — K3184 Gastroparesis: Secondary | ICD-10-CM | POA: Diagnosis present

## 2015-01-13 DIAGNOSIS — J441 Chronic obstructive pulmonary disease with (acute) exacerbation: Secondary | ICD-10-CM

## 2015-01-13 DIAGNOSIS — G4733 Obstructive sleep apnea (adult) (pediatric): Secondary | ICD-10-CM | POA: Diagnosis present

## 2015-01-13 DIAGNOSIS — E662 Morbid (severe) obesity with alveolar hypoventilation: Secondary | ICD-10-CM | POA: Diagnosis present

## 2015-01-13 DIAGNOSIS — I272 Other secondary pulmonary hypertension: Secondary | ICD-10-CM | POA: Diagnosis present

## 2015-01-13 DIAGNOSIS — J9621 Acute and chronic respiratory failure with hypoxia: Secondary | ICD-10-CM | POA: Diagnosis present

## 2015-01-13 DIAGNOSIS — Z9981 Dependence on supplemental oxygen: Secondary | ICD-10-CM

## 2015-01-13 DIAGNOSIS — K219 Gastro-esophageal reflux disease without esophagitis: Secondary | ICD-10-CM | POA: Diagnosis present

## 2015-01-13 DIAGNOSIS — Z8249 Family history of ischemic heart disease and other diseases of the circulatory system: Secondary | ICD-10-CM

## 2015-01-13 DIAGNOSIS — Z6835 Body mass index (BMI) 35.0-35.9, adult: Secondary | ICD-10-CM | POA: Diagnosis not present

## 2015-01-13 DIAGNOSIS — K7460 Unspecified cirrhosis of liver: Secondary | ICD-10-CM | POA: Diagnosis present

## 2015-01-13 DIAGNOSIS — I5033 Acute on chronic diastolic (congestive) heart failure: Secondary | ICD-10-CM | POA: Diagnosis present

## 2015-01-13 DIAGNOSIS — R0602 Shortness of breath: Secondary | ICD-10-CM | POA: Diagnosis present

## 2015-01-13 DIAGNOSIS — Z86718 Personal history of other venous thrombosis and embolism: Secondary | ICD-10-CM

## 2015-01-13 DIAGNOSIS — R490 Dysphonia: Secondary | ICD-10-CM | POA: Diagnosis present

## 2015-01-13 DIAGNOSIS — R911 Solitary pulmonary nodule: Secondary | ICD-10-CM | POA: Diagnosis present

## 2015-01-13 DIAGNOSIS — Z87891 Personal history of nicotine dependence: Secondary | ICD-10-CM

## 2015-01-13 DIAGNOSIS — J9620 Acute and chronic respiratory failure, unspecified whether with hypoxia or hypercapnia: Secondary | ICD-10-CM

## 2015-01-13 DIAGNOSIS — Z86711 Personal history of pulmonary embolism: Secondary | ICD-10-CM

## 2015-01-13 DIAGNOSIS — I509 Heart failure, unspecified: Secondary | ICD-10-CM

## 2015-01-13 DIAGNOSIS — I2781 Cor pulmonale (chronic): Secondary | ICD-10-CM | POA: Diagnosis present

## 2015-01-13 DIAGNOSIS — R918 Other nonspecific abnormal finding of lung field: Secondary | ICD-10-CM | POA: Diagnosis present

## 2015-01-13 DIAGNOSIS — J9612 Chronic respiratory failure with hypercapnia: Secondary | ICD-10-CM | POA: Diagnosis present

## 2015-01-13 DIAGNOSIS — I50812 Chronic right heart failure: Secondary | ICD-10-CM

## 2015-01-13 DIAGNOSIS — J438 Other emphysema: Secondary | ICD-10-CM | POA: Diagnosis present

## 2015-01-13 DIAGNOSIS — J9600 Acute respiratory failure, unspecified whether with hypoxia or hypercapnia: Secondary | ICD-10-CM | POA: Diagnosis present

## 2015-01-13 LAB — COMPREHENSIVE METABOLIC PANEL
ALT: 46 U/L — AB (ref 0–35)
AST: 37 U/L (ref 0–37)
Albumin: 3.8 g/dL (ref 3.5–5.2)
Alkaline Phosphatase: 68 U/L (ref 39–117)
Anion gap: 11 (ref 5–15)
BUN: 6 mg/dL (ref 6–23)
CHLORIDE: 93 mmol/L — AB (ref 96–112)
CO2: 31 mmol/L (ref 19–32)
Calcium: 9.2 mg/dL (ref 8.4–10.5)
Creatinine, Ser: 0.46 mg/dL — ABNORMAL LOW (ref 0.50–1.10)
GFR calc Af Amer: >90 mL/min (ref >90)
GFR calc non Af Amer: >90 mL/min (ref >90)
GLUCOSE: 101 mg/dL — AB (ref 70–99)
POTASSIUM: 4.1 mmol/L (ref 3.5–5.1)
Sodium: 135 mmol/L (ref 135–145)
TOTAL PROTEIN: 6.9 g/dL (ref 6.0–8.3)
Total Bilirubin: 0.8 mg/dL (ref 0.3–1.2)

## 2015-01-13 LAB — CBC
HCT: 45.8 % (ref 36.0–46.0)
Hemoglobin: 14.9 g/dL (ref 12.0–15.0)
MCH: 32.3 pg (ref 26.0–34.0)
MCHC: 32.5 g/dL (ref 30.0–36.0)
MCV: 99.3 fL (ref 78.0–100.0)
PLATELETS: 163 10*3/uL (ref 150–400)
RBC: 4.61 MIL/uL (ref 3.87–5.11)
RDW: 14.4 % (ref 11.5–15.5)
WBC: 11.8 10*3/uL — AB (ref 4.0–10.5)

## 2015-01-13 LAB — I-STAT TROPONIN, ED: TROPONIN I, POC: 0 ng/mL (ref 0.00–0.08)

## 2015-01-13 LAB — BRAIN NATRIURETIC PEPTIDE: B Natriuretic Peptide: 86.5 pg/mL (ref 0.0–100.0)

## 2015-01-13 MED ORDER — PREDNISONE 50 MG PO TABS
50.0000 mg | ORAL_TABLET | Freq: Every day | ORAL | Status: DC
  Administered 2015-01-13 – 2015-01-15 (×3): 50 mg via ORAL
  Filled 2015-01-13 (×3): qty 1

## 2015-01-13 MED ORDER — PANTOPRAZOLE SODIUM 40 MG PO TBEC
40.0000 mg | DELAYED_RELEASE_TABLET | Freq: Every day | ORAL | Status: DC
  Administered 2015-01-13 – 2015-01-15 (×3): 40 mg via ORAL
  Filled 2015-01-13 (×3): qty 1

## 2015-01-13 MED ORDER — MOMETASONE FURO-FORMOTEROL FUM 100-5 MCG/ACT IN AERO
2.0000 | INHALATION_SPRAY | Freq: Two times a day (BID) | RESPIRATORY_TRACT | Status: DC
Start: 2015-01-13 — End: 2015-01-13
  Filled 2015-01-13: qty 8.8

## 2015-01-13 MED ORDER — IOHEXOL 350 MG/ML SOLN
100.0000 mL | Freq: Once | INTRAVENOUS | Status: AC | PRN
  Administered 2015-01-13: 100 mL via INTRAVENOUS

## 2015-01-13 MED ORDER — ARFORMOTEROL TARTRATE 15 MCG/2ML IN NEBU
15.0000 ug | INHALATION_SOLUTION | Freq: Two times a day (BID) | RESPIRATORY_TRACT | Status: DC
  Administered 2015-01-13 – 2015-01-15 (×5): 15 ug via RESPIRATORY_TRACT
  Filled 2015-01-13 (×4): qty 2

## 2015-01-13 MED ORDER — HEPARIN SODIUM (PORCINE) 5000 UNIT/ML IJ SOLN
5000.0000 [IU] | Freq: Three times a day (TID) | INTRAMUSCULAR | Status: DC
Start: 2015-01-13 — End: 2015-01-15
  Administered 2015-01-13 – 2015-01-15 (×6): 5000 [IU] via SUBCUTANEOUS
  Filled 2015-01-13 (×7): qty 1

## 2015-01-13 MED ORDER — POLYETHYLENE GLYCOL 3350 17 G PO PACK
17.0000 g | PACK | Freq: Two times a day (BID) | ORAL | Status: DC
  Administered 2015-01-13 – 2015-01-14 (×3): 17 g via ORAL
  Filled 2015-01-13 (×4): qty 1

## 2015-01-13 MED ORDER — IPRATROPIUM-ALBUTEROL 0.5-2.5 (3) MG/3ML IN SOLN
3.0000 mL | Freq: Once | RESPIRATORY_TRACT | Status: AC
  Administered 2015-01-13: 3 mL via RESPIRATORY_TRACT
  Filled 2015-01-13: qty 3

## 2015-01-13 MED ORDER — OXYCODONE-ACETAMINOPHEN 5-325 MG PO TABS
1.0000 | ORAL_TABLET | Freq: Three times a day (TID) | ORAL | Status: DC | PRN
  Administered 2015-01-13 – 2015-01-15 (×6): 1 via ORAL
  Filled 2015-01-13 (×6): qty 1

## 2015-01-13 MED ORDER — BUDESONIDE 0.5 MG/2ML IN SUSP
0.5000 mg | Freq: Two times a day (BID) | RESPIRATORY_TRACT | Status: DC
  Administered 2015-01-13 – 2015-01-15 (×5): 0.5 mg via RESPIRATORY_TRACT
  Filled 2015-01-13 (×5): qty 2

## 2015-01-13 MED ORDER — CETYLPYRIDINIUM CHLORIDE 0.05 % MT LIQD
7.0000 mL | Freq: Two times a day (BID) | OROMUCOSAL | Status: DC
  Administered 2015-01-13 – 2015-01-14 (×4): 7 mL via OROMUCOSAL

## 2015-01-13 MED ORDER — ALBUTEROL SULFATE HFA 108 (90 BASE) MCG/ACT IN AERS: 2.0000 | INHALATION_SPRAY | Freq: Four times a day (QID) | RESPIRATORY_TRACT | Status: DC | PRN

## 2015-01-13 MED ORDER — TIOTROPIUM BROMIDE MONOHYDRATE 18 MCG IN CAPS
18.0000 ug | ORAL_CAPSULE | Freq: Every day | RESPIRATORY_TRACT | Status: DC
  Administered 2015-01-13 – 2015-01-15 (×3): 18 ug via RESPIRATORY_TRACT
  Filled 2015-01-13: qty 5

## 2015-01-13 MED ORDER — ALBUTEROL SULFATE (2.5 MG/3ML) 0.083% IN NEBU: 2.5000 mg | INHALATION_SOLUTION | Freq: Four times a day (QID) | RESPIRATORY_TRACT | Status: DC | PRN

## 2015-01-13 MED ORDER — TORSEMIDE 20 MG PO TABS
20.0000 mg | ORAL_TABLET | Freq: Every day | ORAL | Status: DC
  Administered 2015-01-13 – 2015-01-15 (×3): 20 mg via ORAL
  Filled 2015-01-13 (×4): qty 1

## 2015-01-13 MED ORDER — FUROSEMIDE 10 MG/ML IJ SOLN
40.0000 mg | INTRAMUSCULAR | Status: AC
  Administered 2015-01-13: 40 mg via INTRAVENOUS
  Filled 2015-01-13: qty 4

## 2015-01-13 MED ORDER — PREDNISONE 50 MG PO TABS: 50.0000 mg | ORAL_TABLET | Freq: Every day | ORAL | Status: DC

## 2015-01-13 MED ORDER — ONDANSETRON 4 MG PO TBDP: 4.0000 mg | ORAL_TABLET | Freq: Three times a day (TID) | ORAL | Status: DC | PRN

## 2015-01-13 MED ORDER — METOCLOPRAMIDE HCL 10 MG PO TABS
5.0000 mg | ORAL_TABLET | Freq: Four times a day (QID) | ORAL | Status: DC
  Administered 2015-01-13 – 2015-01-15 (×10): 5 mg via ORAL
  Filled 2015-01-13 (×10): qty 1

## 2015-01-13 MED ORDER — SODIUM CHLORIDE 0.9 % IJ SOLN
3.0000 mL | Freq: Two times a day (BID) | INTRAMUSCULAR | Status: DC
  Administered 2015-01-13 – 2015-01-14 (×4): 3 mL via INTRAVENOUS

## 2015-01-13 NOTE — Consult Note (Signed)
Name: Mary [name] MRN: [id number] DOB: [birth year]    ADMISSION DATE:  01/13/2015 CONSULTATION DATE:  2/8 REFERRING [name] :  Mary [name]   CHIEF COMPLAINT:  Ground glass pulmonary nodule   BRIEF PATIENT DESCRIPTION:   39 year old female f/b MR for chronic resp failure on the basis of COPD, cor pulmonale and OSA, probable OHS. Admitted w/ acute on chronic resp failure: multi-factorial, due to decomp cor pulmonale, AECOPD and element of LPR. PCCM asked to see on 2/8 d/t CT finding of gradually increasing RUL ground glass nodule.   SIGNIFICANT EVENTS   STUDIES:  CT chest 2/8: 1. No evidence of pulmonary embolus.2. 2.0 cm focus of ground-glass opacity seen near the right lung apex. This focus has gradually increased in size over the past 2 1/2years, up from 1.4 cm. G3. Minimal underlying interstitial prominence; mild emphysematous change seen bilaterally. Minimal honeycombing near the lung apices. Mildly prominent 1.1 cm right hilar node seen. 7 mm node adjacent to the distal esophagus, of uncertain significance. This may have increased slightly in size. Diffuse fatty infiltration within the liver.   HISTORY OF PRESENT ILLNESS:   39 year old female f/b MR for chronic resp failure on the basis of COPD, cor pulmonale and OSA, probable OHS. Was in usual state of health until 2/5 when she started to notice slight increase in shortness of breath. She stated that this continued to progress over the following days. She had been without her advair for 3 days prior. She denied: nasal congestion, head ache, sick exposure, or sore throat. She developed non-productive cough, 2 d prior to admit. The shortness of breath worsened to point she was using her O2 and BIPAP more frequently. She got to the point she had resting dyspnea and worsening wheezing. She checked her O2 sats and noted them to be 54% at home so she reported to the ED. IN the ED a CT chest was obtained. This showed a ground glass cyst-like opacity  in the RUL. PCCM was asked to see re: this CT abnormality   PAST MEDICAL HISTORY :   has a past medical history of COPD (chronic obstructive pulmonary disease); History of alcoholism; Irregular menses; DVT of upper extremity (deep vein thrombosis) (April 2013); Pulmonary embolus (April 2013); Moderate to severe pulmonary hypertension (April 2013); Hepatic cirrhosis; Axillary adenopathy; Periodontal disease; Chronic right-sided CHF (congestive heart failure) (03/23/2012); Pneumonia (~ 1985; 01/2011); History of chronic bronchitis; Exertional shortness of breath; On home oxygen therapy; OSA (obstructive sleep apnea); and Migraine.  has past surgical history that includes Cardiac surgery (1977/11/29); Lung surgery (04/09/1977); Appendectomy (05/03/2013); Cardiac catheterization (03/2012); laparoscopic appendectomy (N/A, 05/03/2013); and right heart catheterization (N/A, 03/07/2012). Prior to Admission medications   Medication Sig Start Date End Date Taking? Authorizing Provider  albuterol (PROVENTIL HFA;VENTOLIN HFA) 108 (90 BASE) MCG/ACT inhaler Inhale 2 puffs into the lungs every 6 (six) hours as needed for wheezing or shortness of breath. 09/06/14  Yes Mary Shan, [name]  Fluticasone-Salmeterol (ADVAIR) 250-50 MCG/DOSE AEPB Inhale 1 puff into the lungs every 12 (twelve) hours. 09/27/14  Yes Mary Shan, [name]  metoCLOPramide (REGLAN) 5 MG tablet Take 1 tablet (5 mg total) by mouth 4 (four) times daily. 12/18/14  Yes Mary Loll, [name]  ondansetron (ZOFRAN-ODT) 8 MG disintegrating tablet Take 0.5 tablets (4 mg total) by mouth every 8 (eight) hours as needed for nausea. 12/18/14  Yes Mary Loll, [name]  oxyCODONE-acetaminophen (PERCOCET/ROXICET) 5-325 MG per tablet Take 1 tablet by mouth every 8 (eight) hours  as needed for severe pain. 12/18/14  Yes Mary [name]  pantoprazole (PROTONIX) 40 MG tablet Take 1 tablet (40 mg total) by mouth daily. 12/18/14  Yes Mary [name]  polyethylene glycol [facility](MIRALAX /  GLYCOLAX) packet Take 17 g by mouth 2 (two) times daily. 12/18/14  Yes Mary [name]  tiotropium (SPIRIVA) 18 MCG inhalation capsule Place 1 capsule (18 mcg total) into inhaler and inhale daily. 09/27/14  Yes Mary ShanMurali Ramaswamy, [name]  torsemide (DEMADEX) 20 MG tablet Take 1 tablet (20 mg total) by mouth daily. 09/10/14  Yes Mary BuntingBrian S Crenshaw, [name]   No Known Allergies  FAMILY HISTORY:  family history includes Alcohol abuse in her father; Heart disease in her father; Hypertension in her father; Other in her brother. SOCIAL HISTORY:  reports that she quit smoking about 2 years ago. Her smoking use included Cigarettes. She has a 25 pack-year smoking history. She has never used smokeless tobacco. She reports that she does not drink alcohol or use illicit drugs.  REVIEW OF SYSTEMS:   Constitutional: Negative for fever, chills, weight loss, malaise/fatigue and diaphoresis.  HENT: Negative for hearing loss, ear pain, nosebleeds, congestion, sore throat, neck pain, tinnitus and ear discharge.   Eyes: Negative for blurred vision, double vision, photophobia, pain, discharge and redness.  Respiratory: Negative for cough, clear for last 2 d hemoptysis, sputum production, shortness of breath, wheezing, worse over 5d prior to admit and stridor.   Cardiovascular: Negative for chest pain, palpitations, orthopnea, claudication, leg swelling and PND.  Gastrointestinal: Negative for heartburn, nausea, vomiting, abdominal pain, diarrhea, constipation, blood in stool and melena.  Genitourinary: Negative for dysuria, urgency, frequency, hematuria and flank pain.  Musculoskeletal: Negative for myalgias, back pain, joint pain and falls.  Skin: Negative for itching and rash.  Neurological: Negative for dizziness, tingling, tremors, sensory change, speech change, focal weakness, seizures, loss of consciousness, weakness and headaches.  Endo/Heme/Allergies: Negative for environmental allergies and polydipsia. Does not  bruise/bleed easily.  SUBJECTIVE:   VITAL SIGNS: Temp:  [98.2 F (36.8 C)-98.6 F (37 C)] 98.6 F (37 C) (02/08 0847) Pulse Rate:  [91-121] 97 (02/08 0847) Resp:  [16-27] 18 (02/08 0847) BP: (98-134)/(65-92) 123/81 mmHg (02/08 0847) SpO2:  [91 %-100 %] 93 % (02/08 0847) Weight:  [87.1 kg (192 lb 0.3 oz)] 87.1 kg (192 lb 0.3 oz) (02/08 0847)  PHYSICAL EXAMINATION: General:  Chronically ill appearing white female, appears much older than stated age  Neuro:  Awake, alert, no focal def  HEENT:  [place], voice hoarse (chronic); neck large. + prominent upper airway wheeze  Cardiovascular:  rrr Lungs:  Faint exp wheeze Abdomen:  Obese  Musculoskeletal:  Intact  Skin:  Trace LE edema    Recent Labs Lab 01/13/15 0049  NA 135  K 4.1  CL 93*  CO2 31  BUN 6  CREATININE 0.46*  GLUCOSE 101*    Recent Labs Lab 01/13/15 0049  HGB 14.9  HCT 45.8  WBC 11.8*  PLT 163   Ct Angio Chest Pe W/cm &/or Wo Cm  01/13/2015   CLINICAL DATA:  Acute onset of shortness of breath for 3 days. Mild leukocytosis. Initial encounter.  EXAM: CT ANGIOGRAPHY CHEST WITH CONTRAST  TECHNIQUE: Multidetector CT imaging of the chest was performed using the standard protocol during bolus administration of intravenous contrast. Multiplanar CT image reconstructions and MIPs were obtained to evaluate the vascular anatomy.  CONTRAST:  100mL OMNIPAQUE IOHEXOL 350 MG/ML SOLN  COMPARISON:  Chest radiograph performed 12/14/2014, and CTA  of the chest performed 12/27/2013  FINDINGS: There is no evidence of pulmonary embolus.  Mild emphysematous change is noted bilaterally. A rounded focus of ground-glass opacity is noted near the right lung apex, measuring 2.0 cm; this area has increased mildly in size from 2015, at which time it measured 1.7 cm, and from 2013, at which time it measured 1.4 cm. Given the slow but persistent growth over time, this raises concern for a gradually enlarging indolent bronchioloalveolar carcinoma, though  this is unusual for the patient's age, and a very unusual infection or atypical adenomatous hyperplasia might have such an appearance.  Minimal underlying interstitial prominence is noted. Minimal honeycombing is also seen near both lung apices. There is no evidence of pleural effusion or pneumothorax.  A mildly prominent 1.1 cm right hilar node is seen. No mediastinal lymphadenopathy is identified. Trace pericardial fluid remains within normal limits. The main pulmonary outflow tract is mildly prominent, raising concern for pulmonary arterial hypertension. The great vessels are grossly unremarkable. No axillary lymphadenopathy is seen. The visualized portions of the thyroid gland are unremarkable in appearance.  Significantly decreased hepatic attenuation suggests diffuse fatty infiltration. The visualized portions of the spleen are unremarkable. A 7 mm node adjacent to the distal esophagus is of uncertain significance.  No acute osseous abnormalities are seen.  Review of the MIP images confirms the above findings.  IMPRESSION: 1. No evidence of pulmonary embolus. 2. 2.0 cm focus of ground-glass opacity seen near the right lung apex. This focus has gradually increased in size over the past 2 1/2 years, up from 1.4 cm. Given the slow but persistent growth over time, this raises concern for a gradually enlarging indolent bronchioloalveolar cell carcinoma. However, this would be unusual for the patient's age. A very unusual infection or atypical adenomatous hyperplasia might have a similar appearance. Pulmonary consultation is recommended. 3. Minimal underlying interstitial prominence; mild emphysematous change seen bilaterally. Minimal honeycombing near the lung apices. 4. Mildly prominent 1.1 cm right hilar node seen. 5. 7 mm node adjacent to the distal esophagus, of uncertain significance. This may have increased slightly in size. 6. Diffuse fatty infiltration within the liver.   Electronically Signed   By: Roanna Raider M.D.   On: 01/13/2015 03:10    ASSESSMENT / PLAN:  Acute on chronic hypoxic respiratory failure AECOPD OSA/prob OHS Decompensated cor pulmonale  Probable element of Laryngopharyngeal reflux disease (LPR) H/o gastroparesis Chronic vocal hoarseness  Ground glass pulmonary nodule. Increased in size by comparison film 2 yrs ago  Discussion  Suspect her acute decompensation was multifactorial: AECOPD, decompensated cor pulmonale, and further complicated by LPR. I wonder if she could be refluxing at night given her gastroparesis.  Don't think that the CT findings are responsible for her current symptom burden but certainly warrant evaluation as it has progressed in size on comparison films.   Rec Cont diuresis  Increase PPI to BID Resume her reglan Change BDs to brovana/budesonide Taper steroids Will eventually need further evaluation of the pulmonary nodule    Simonne Martinet ACNP-BC [facility] Pulmonary/Critical Care Pager # [phone] OR # [phone] if no answer   01/13/2015, 10:42 AM  Attending:  I have seen and examined the patient with nurse practitioner/resident and agree with the note above.   On my exam she is still wheezing.  Images from CT chest reviewed.  Impression:  mostly AE COPD exacerbation Her lung nodule looks like a scar to me, but the fact it grew is worrisome; biopsy would  have to wait until she is better.  I agree with solumedrol and diuresis for AE COPD and possibly volume overload Would pursue PET CT of this nodule prior to any biopsy, explained to patient but she is quite anxious about this  Heber Waikele, [name] [place] PCCM Pager: [phone] Cell: [phone] If no response, call [phone]

## 2015-01-13 NOTE — Progress Notes (Signed)
Per Dr Julian ReilGardner at bedside, hold off on bipap for right now.  Bipap order is for qhs and pt is in ED and not ready for bed.  Abg also discontinued per Dr Julian ReilGardner.  RN / PA aware.

## 2015-01-13 NOTE — H&P (Signed)
[facility] History and Physical  Mary HakimMichelle L [place] WUJ:[id number] DOB: 09/08/1976 DOA: 01/13/2015  Referring physician: EDP PCP: Elvina SidleLAUENSTEIN,KURT, MD   Chief Complaint: SOB   HPI: Mary HakimMichelle L [name] is a 39 y.o. female with h/o emphyzematous COPD on 2-3L home O2 at baseline despite her young age, cirrhosis, does NOT have alpha-1-antitripsin deficiency.  Patient presents to the ED for worsening SOB over the last 3 days with worsening DOE.  Typically only requires home O2 at night time or intermittently and uses BIPAP at night for OSA.  For last 3 days has required it at all times.  Amount required has increased to 4L.  Has nasal congestion, severe wheezing and intermittent dry, non-productive cough.  No fever.  Nebs havent helped at home.  Review of Systems: Systems reviewed.  As above, otherwise negative  Past Medical History  Diagnosis Date  . COPD (chronic obstructive pulmonary disease)     PFTS 03/08/12: fev1 0.58L/1%, FVC 1.18/33%, Ratop 49 and c/w ssevere obstruction. 21% BD response on FVC, RV 219%, DLCO 11/54%  . History of alcoholism   . Irregular menses   . DVT of upper extremity (deep vein thrombosis) April 2013    right subclavian // Unclear precipitating cause - possibly significant right heart failure, with vascular stasis potentially predisposing to clotting.    . Pulmonary embolus April 2013    Precipitating cause unclear. Was treated with coumadin from April-June 2013.  . Moderate to severe pulmonary hypertension April 2013    Cardiac cath on 03/06/12 - 1. Elevated pulmonary artery pressures, right sided filling pressures.,  2. PA: 64/45 (mean 53)    . Hepatic cirrhosis     Questionable history of - Noted on CT abdomen (03/2012) - thought to be due to vascular congestion from right heart failure +/- patient's history of alcohol abuse  . Axillary adenopathy     right axillary adenopathy noted on CT chest (03/06/2012)  . Periodontal disease   . Chronic right-sided CHF  (congestive heart failure) 03/23/2012    with cor pulmonale. Last RHC 03/2012  . Pneumonia ~ 1985; 01/2011  . History of chronic bronchitis   . Exertional shortness of breath   . On home oxygen therapy     "2-3 L 24/7" (05/03/2013)  . OSA (obstructive sleep apnea)     wears noctural BiPAP (05/03/2013)  . Migraine     "~ 1/yr" (05/03/2013)   Past Surgical History  Procedure Laterality Date  . Cardiac surgery  10/16/1977    "my heart was backwards" (05/03/2013)  . Lung surgery  10/16/1977  . Appendectomy  05/03/2013  . Cardiac catheterization  03/2012  . Laparoscopic appendectomy N/A 05/03/2013    Procedure: APPENDECTOMY LAPAROSCOPIC;  Surgeon: Cherylynn RidgesJames O Wyatt, MD;  [facility];  Service: General;  Laterality: N/A;  . Right heart catheterization N/A 03/07/2012    Procedure: RIGHT HEART CATH;  Surgeon: Kathleene Hazelhristopher D McAlhany, MD;  [facility];  Service: Cardiovascular;  Laterality: N/A;   Social History:  reports that she quit smoking about 2 years ago. Her smoking use included Cigarettes. She has a 25 pack-year smoking history. She has never used smokeless tobacco. She reports that she does not drink alcohol or use illicit drugs.  No Known Allergies  Family History  Problem Relation Age of Onset  . Hypertension Father   . Heart disease Father     CHF; died age 38   . Alcohol abuse Father   . Other Brother  died age 53 y.o overdose     Prior to Admission medications   Medication Sig Start Date End Date Taking? Authorizing Provider  albuterol (PROVENTIL HFA;VENTOLIN HFA) 108 (90 BASE) MCG/ACT inhaler Inhale 2 puffs into the lungs every 6 (six) hours as needed for wheezing or shortness of breath. 09/06/14  Yes Kalman Shan, MD  Fluticasone-Salmeterol (ADVAIR) 250-50 MCG/DOSE AEPB Inhale 1 puff into the lungs every 12 (twelve) hours. 09/27/14  Yes Kalman Shan, MD  metoCLOPramide (REGLAN) 5 MG tablet Take 1 tablet (5 mg total) by mouth 4 (four) times daily. 12/18/14  Yes  Vassie Loll, MD  ondansetron (ZOFRAN-ODT) 8 MG disintegrating tablet Take 0.5 tablets (4 mg total) by mouth every 8 (eight) hours as needed for nausea. 12/18/14  Yes Vassie Loll, MD  oxyCODONE-acetaminophen (PERCOCET/ROXICET) 5-325 MG per tablet Take 1 tablet by mouth every 8 (eight) hours as needed for severe pain. 12/18/14  Yes Vassie Loll, MD  pantoprazole (PROTONIX) 40 MG tablet Take 1 tablet (40 mg total) by mouth daily. 12/18/14  Yes Vassie Loll, MD  polyethylene glycol [facility] / GLYCOLAX) packet Take 17 g by mouth 2 (two) times daily. 12/18/14  Yes Vassie Loll, MD  tiotropium (SPIRIVA) 18 MCG inhalation capsule Place 1 capsule (18 mcg total) into inhaler and inhale daily. 09/27/14  Yes Kalman Shan, MD  torsemide (DEMADEX) 20 MG tablet Take 1 tablet (20 mg total) by mouth daily. 09/10/14  Yes Lewayne Bunting, MD   Physical Exam: Filed Vitals:   01/13/15 0400  BP: 134/92  Pulse: 116  Temp:   Resp: 27    BP 134/92 mmHg  Pulse 116  Temp(Src) 98.2 F (36.8 C) (Oral)  Resp 27  SpO2 100%  LMP 01/12/2015  General Appearance:    Alert, oriented, no distress, appears stated age  Head:    Normocephalic, atraumatic  Eyes:    PERRL, EOMI, sclera non-icteric        Nose:   Nares without drainage or epistaxis. Mucosa, turbinates normal  Throat:   Moist mucous membranes. Oropharynx without erythema or exudate.  Neck:   Supple. No carotid bruits.  No thyromegaly.  No lymphadenopathy.   Back:     No CVA tenderness, no spinal tenderness  Lungs:     Faint wheezes bilaterally  Chest wall:    No tenderness to palpitation  Heart:    Regular rate and rhythm without murmurs, gallops, rubs  Abdomen:     Soft, non-tender, nondistended, normal bowel sounds, no organomegaly  Genitalia:    deferred  Rectal:    deferred  Extremities:   No clubbing, cyanosis or edema.  Pulses:   2+ and symmetric all extremities  Skin:   Skin color, texture, turgor normal, no rashes or lesions  Lymph  nodes:   Cervical, supraclavicular, and axillary nodes normal  Neurologic:   CNII-XII intact. Normal strength, sensation and reflexes      throughout    Labs on Admission:  Basic Metabolic Panel:  Recent Labs Lab 01/13/15 0049  NA 135  K 4.1  CL 93*  CO2 31  GLUCOSE 101*  BUN 6  CREATININE 0.46*  CALCIUM 9.2   Liver Function Tests:  Recent Labs Lab 01/13/15 0049  AST 37  ALT 46*  ALKPHOS 68  BILITOT 0.8  PROT 6.9  ALBUMIN 3.8   No results for input(s): LIPASE, AMYLASE in the last 168 hours. No results for input(s): AMMONIA in the last 168 hours. CBC:  Recent Labs Lab 01/13/15 0049  WBC 11.8*  HGB 14.9  HCT 45.8  MCV 99.3  PLT 163   Cardiac Enzymes: No results for input(s): CKTOTAL, CKMB, CKMBINDEX, TROPONINI in the last 168 hours.  BNP (last 3 results)  Recent Labs  01/13/14 1237  PROBNP 185.2*   CBG: No results for input(s): GLUCAP in the last 168 hours.  Radiological Exams on Admission: Ct Angio Chest Pe W/cm &/or Wo Cm  01/13/2015   CLINICAL DATA:  Acute onset of shortness of breath for 3 days. Mild leukocytosis. Initial encounter.  EXAM: CT ANGIOGRAPHY CHEST WITH CONTRAST  TECHNIQUE: Multidetector CT imaging of the chest was performed using the standard protocol during bolus administration of intravenous contrast. Multiplanar CT image reconstructions and MIPs were obtained to evaluate the vascular anatomy.  CONTRAST:  OMNIPAQUE IOHEXOL 350 MG/ML SOLN  COMPARISON:  Chest radiograph performed 12/14/2014, and CTA of the chest performed 12/27/2013  FINDINGS: There is no evidence of pulmonary embolus.  Mild emphysematous change is noted bilaterally. A rounded focus of ground-glass opacity is noted near the right lung apex, measuring 2.0 cm; this area has increased mildly in size from 2015, at which time it measured 1.7 cm, and from 2013, at which time it measured 1.4 cm. Given the slow but persistent growth over time, this raises concern for a gradually  enlarging indolent bronchioloalveolar carcinoma, though this is unusual for the patient's age, and a very unusual infection or atypical adenomatous hyperplasia might have such an appearance.  Minimal underlying interstitial prominence is noted. Minimal honeycombing is also seen near both lung apices. There is no evidence of pleural effusion or pneumothorax.  A mildly prominent 1.1 cm right hilar node is seen. No mediastinal lymphadenopathy is identified. Trace pericardial fluid remains within normal limits. The main pulmonary outflow tract is mildly prominent, raising concern for pulmonary arterial hypertension. The great vessels are grossly unremarkable. No axillary lymphadenopathy is seen. The visualized portions of the thyroid gland are unremarkable in appearance.  Significantly decreased hepatic attenuation suggests diffuse fatty infiltration. The visualized portions of the spleen are unremarkable. A 7 mm node adjacent to the distal esophagus is of uncertain significance.  No acute osseous abnormalities are seen.  Review of the MIP images confirms the above findings.  IMPRESSION: 1. No evidence of pulmonary embolus. 2. 2.0 cm focus of ground-glass opacity seen near the right lung apex. This focus has gradually increased in size over the past 2 1/2 years, up from 1.4 cm. Given the slow but persistent growth over time, this raises concern for a gradually enlarging indolent bronchioloalveolar cell carcinoma. However, this would be unusual for the patient's age. A very unusual infection or atypical adenomatous hyperplasia might have a similar appearance. Pulmonary consultation is recommended. 3. Minimal underlying interstitial prominence; mild emphysematous change seen bilaterally. Minimal honeycombing near the lung apices. 4. Mildly prominent 1.1 cm right hilar node seen. 5. 7 mm node adjacent to the distal esophagus, of uncertain significance. This may have increased slightly in size. 6. Diffuse fatty infiltration  within the liver.   Electronically Signed   By: Roanna Raider M.D.   On: 01/13/2015 03:10    EKG: Independently reviewed.  Assessment/Plan Principal Problem:   COPD exacerbation Active Problems:   Chronic respiratory failure with hypercapnia   Chronic right-sided CHF (congestive heart failure)   Acute on chronic respiratory failure with hypoxia   Other emphysema   Acute respiratory failure   Pulmonary mass   1. COPD exacerbation - 1. Adult wheeze protocol 2. Ordering  PO prednisone daily 2. Acute hypoxic respiratory failure - due to COPD exacerbation, on supplemental O2 during day and BIPAP at night for OSA. 3. Chronic respiratory failure with hypercapnea - use minimum amount of O2 needed to achieve O2 sat of 92%.  Does not appear to have acute hypercapnea at this time. 4. Pulmonary nodule / mass - Located in the R lung apex this ground glass opacity is concerning for bronchogenic carcinoma given its 2.5 year duration and increase in size (see Radiologist interpretation).  Patient needs pulmonology consult and likely biopsy.    Code Status: Full Code  Family Communication: No family in room Disposition Plan: Admit to inpatient   Time spent: 70 min  Markos Theil M. [facility] Pager [phone]  If 7AM-7PM, please contact the day team taking care of the patient [email or web address] Password [facility] 01/13/2015, 4:29 AM

## 2015-01-13 NOTE — ED Notes (Signed)
Patient presents from home via EMS for SOB/HA x3 days. Patient reports 54% O2 sats. Wears 2.5L [place], patient reports 3.5L [place] x3 days.  ST 116, 125/65BP, 20Resp, 97% 15L Non rebreather. Hx COPD/CHF.

## 2015-01-13 NOTE — Progress Notes (Signed)
1:07 PM I agree with HPI/GPe and A/P per Dr. Laban EmperorJ Gardner  39 y/o ? known h/o Obesity BMI ~32, Former smoker 25 yrs, Gold stage 'b'-'c' COPD, COr pulmonale/Pulm HTN, h/o RUE DVT 03/2012, OSA on Bipap--Recent Hospitalization 12/13/14-->1/13/541for Obsitpation/Gerd Gastroparesis thoguth secondary to narcotic Readmitted early 01/13/15 a.m. With the COPD, worsening DOE and ? Oxygen requirement     HEENT CHEST CARDIAC ABDOMEN NEURO SKIN/MUSCULAR  Patient Active Problem List   Diagnosis Date Noted  . Acute respiratory failure 01/13/2015  . Pulmonary mass 01/13/2015  . Other emphysema   . Gastroparesis   . Esophageal reflux   . Obstipation   . Dehydration 12/14/2014  . Abdominal pain 12/14/2014  . Fever 12/14/2014  . Dyspnea 09/18/2013  . Hypokalemia 02/28/2013  . Acute on chronic respiratory failure with hypoxia 02/28/2013  . Financial difficulties 02/28/2013  . Obesity hypoventilation syndrome 02/28/2013  . Elevated troponin 02/28/2013  . Myalgia 02/28/2013  . Influenza with respiratory manifestations 02/28/2013  . COPD exacerbation 09/22/2012  . Anovulation 06/30/2012  . Preventative health care 05/05/2012  . Irregular menstrual cycle 04/27/2012  . Chronic right-sided CHF (congestive heart failure) 03/23/2012  . Pulmonary hypertension 03/23/2012  . Cor pulmonale 03/21/2012  . Chronic respiratory failure with hypercapnia 03/21/2012  . OSA on BiPAP 03/19/2012  . Axillary lymphadenopathy 03/06/2012  . Congenital heart disease 01/28/2012  . Obesity 01/28/2012  . Dental caries 01/28/2012  . COPD (chronic obstructive pulmonary disease) 01/26/2012   Doing a little better Agree with HPI and Appreciate Pulm input She will need OP work-up of her mass Await Induced sputum cult  Pleas KochJai Duante Arocho, MD [facility] [phone]

## 2015-01-13 NOTE — Progress Notes (Signed)
At 0507, pt seen and evaluated for adult wheeze protocol per MD rx.  Pt found on 3lnc, HR110, rr22, spo2 93%.  BBS were diminished with crackles noted in upper lobes.  No wheezes auscultated.  No respiratory distress/sob noted or voiced by patient.  Pt states she just received a breathing treatment and doesn't feel she needs another one at this time.  Pt does not meet criteria for additional nebs per wheeze protocol at this time.  MD notified.

## 2015-01-13 NOTE — ED Notes (Signed)
Bed: WA17 Expected date:  Expected time:  Means of arrival:  Comments: EMS 39 yo female SOB x 3 days and headache/hx COPD and CHF

## 2015-01-13 NOTE — Progress Notes (Signed)
RT placed pt on auto titrate Bipap (Min EPAP-5cmH2O & Max IPAP-20cmH2O) with 3L of oxygen bled in via ffm. Sterile water was added for humidification. Pt states she is comfortable and is tolerating Bipap well at this time. RT will continue to monitor as needed.

## 2015-01-13 NOTE — ED Provider Notes (Signed)
CSN: [id number]     Arrival date & time 01/13/15  0034 History   First MD Initiated Contact with Patient 01/13/15 [phone]     Chief Complaint  Patient presents with  . Shortness of Breath     (Consider location/radiation/quality/duration/timing/severity/associated sxs/prior Treatment) HPI Comments: Patient is a 39 yo F PMHx significant for COPD on 2-3 home oxygen, Cirrhosis, CHF, Migraines presenting to the ED for worsening shortness of breath over the last three days with worsening dyspnea on exertion. Patient states she typically requires her home O2 only at nighttime or intermittently, but over the last three days has required it 24/7 (2-3L --> 4L). She states she has has associated nasal congestion and intermittent cough without fever. She states her home O2 saturations today have been in the 50s and her pulse rate has been in the 110s-120s. Denies fevers, chills, nausea, vomiting, diarrhea, abdominal pain.   Patient is a 39 y.o. female presenting with shortness of breath. The history is provided by the patient.  Shortness of Breath Severity:  Severe Onset quality:  Sudden Duration:  3 days Timing:  Constant Progression:  Worsening Chronicity:  Recurrent Context: activity and URI   Relieved by:  Nothing Worsened by:  Activity, coughing, movement and exertion Ineffective treatments:  Oxygen and inhaler Associated symptoms: no abdominal pain   Associated symptoms comment:  R shoulder pain (4PM) resolved after 15 min   Past Medical History  Diagnosis Date  . COPD (chronic obstructive pulmonary disease)     PFTS 03/08/12: fev1 0.58L/1%, FVC 1.18/33%, Ratop 49 and c/w ssevere obstruction. 21% BD response on FVC, RV 219%, DLCO 11/54%  . History of alcoholism   . Irregular menses   . DVT of upper extremity (deep vein thrombosis) April 2013    right subclavian // Unclear precipitating cause - possibly significant right heart failure, with vascular stasis potentially predisposing to clotting.     . Pulmonary embolus April 2013    Precipitating cause unclear. Was treated with coumadin from April-June 2013.  . Moderate to severe pulmonary hypertension April 2013    Cardiac cath on 03/06/12 - 1. Elevated pulmonary artery pressures, right sided filling pressures.,  2. PA: 64/45 (mean 53)    . Hepatic cirrhosis     Questionable history of - Noted on CT abdomen (03/2012) - thought to be due to vascular congestion from right heart failure +/- patient's history of alcohol abuse  . Axillary adenopathy     right axillary adenopathy noted on CT chest (03/06/2012)  . Periodontal disease   . Chronic right-sided CHF (congestive heart failure) 03/23/2012    with cor pulmonale. Last RHC 03/2012  . Pneumonia ~ 1985; 01/2011  . History of chronic bronchitis   . Exertional shortness of breath   . On home oxygen therapy     "2-3 L 24/7" (05/03/2013)  . OSA (obstructive sleep apnea)     wears noctural BiPAP (05/03/2013)  . Migraine     "~ 1/yr" (05/03/2013)   Past Surgical History  Procedure Laterality Date  . Cardiac surgery  1977/03/16    "my heart was backwards" (05/03/2013)  . Lung surgery  1977-11-22  . Appendectomy  05/03/2013  . Cardiac catheterization  03/2012  . Laparoscopic appendectomy N/A 05/03/2013    Procedure: APPENDECTOMY LAPAROSCOPIC;  Surgeon: Cherylynn Ridges, MD;  [facility] OR;  Service: General;  Laterality: N/A;  . Right heart catheterization N/A 03/07/2012    Procedure: RIGHT HEART CATH;  Surgeon: Kathleene Hazel, MD;  Location: [facility] CATH LAB;  Service: Cardiovascular;  Laterality: N/A;   Family History  Problem Relation Age of Onset  . Hypertension Father   . Heart disease Father     CHF; died age 38   . Alcohol abuse Father   . Other Brother     died age 38 y.o overdose   History  Substance Use Topics  . Smoking status: Former Smoker -- 1.00 packs/day for 25 years    Types: Cigarettes    Quit date: 02/18/2012  . Smokeless tobacco: Never Used  . Alcohol Use: No      Comment: previously drinking 1 pint 3-4 days a week for 2002-2013, but in 2014, no longer drinking   OB History    No data available     Review of Systems  Respiratory: Positive for shortness of breath.   Gastrointestinal: Negative for abdominal pain.  All other systems reviewed and are negative.     Allergies  Review of patient's allergies indicates no known allergies.  Home Medications   Prior to Admission medications   Medication Sig Start Date End Date Taking? Authorizing Provider  albuterol (PROVENTIL HFA;VENTOLIN HFA) 108 (90 BASE) MCG/ACT inhaler Inhale 2 puffs into the lungs every 6 (six) hours as needed for wheezing or shortness of breath. 09/06/14  Yes Kalman ShanMurali Ramaswamy, MD  Fluticasone-Salmeterol (ADVAIR) 250-50 MCG/DOSE AEPB Inhale 1 puff into the lungs every 12 (twelve) hours. 09/27/14  Yes Kalman ShanMurali Ramaswamy, MD  metoCLOPramide (REGLAN) 5 MG tablet Take 1 tablet (5 mg total) by mouth 4 (four) times daily. 12/18/14  Yes Vassie Lollarlos Madera, MD  ondansetron (ZOFRAN-ODT) 8 MG disintegrating tablet Take 0.5 tablets (4 mg total) by mouth every 8 (eight) hours as needed for nausea. 12/18/14  Yes Vassie Lollarlos Madera, MD  oxyCODONE-acetaminophen (PERCOCET/ROXICET) 5-325 MG per tablet Take 1 tablet by mouth every 8 (eight) hours as needed for severe pain. 12/18/14  Yes Vassie Lollarlos Madera, MD  pantoprazole (PROTONIX) 40 MG tablet Take 1 tablet (40 mg total) by mouth daily. 12/18/14  Yes Vassie Lollarlos Madera, MD  polyethylene glycol [facility](MIRALAX / GLYCOLAX) packet Take 17 g by mouth 2 (two) times daily. 12/18/14  Yes Vassie Lollarlos Madera, MD  tiotropium (SPIRIVA) 18 MCG inhalation capsule Place 1 capsule (18 mcg total) into inhaler and inhale daily. 09/27/14  Yes Kalman ShanMurali Ramaswamy, MD  torsemide (DEMADEX) 20 MG tablet Take 1 tablet (20 mg total) by mouth daily. 09/10/14  Yes Lewayne BuntingBrian S Crenshaw, MD   BP 134/92 mmHg  Pulse 116  Temp(Src) 98.2 F (36.8 C) (Oral)  Resp 27  SpO2 100%  LMP 01/12/2015 Physical Exam   Constitutional: She is oriented to person, place, and time. She appears well-developed and well-nourished.  Non-toxic appearance. Nasal cannula in place.  HENT:  Head: Normocephalic and atraumatic.  Right Ear: External ear normal.  Left Ear: External ear normal.  Nose: Nose normal.  Eyes: Conjunctivae are normal.  Neck: Normal range of motion.  Cardiovascular: Regular rhythm, normal heart sounds and normal pulses.  Tachycardia present.   Cap refill < 3 sec  Pulmonary/Chest: Accessory muscle usage present. Tachypnea noted. She has decreased breath sounds.  Talking in complete sentences without difficulty.   Abdominal: Soft. There is no tenderness.  Musculoskeletal: She exhibits no edema.  Lymphadenopathy:    She has no cervical adenopathy.  Neurological: She is alert and oriented to person, place, and time.  Skin: Skin is warm and dry. She is not diaphoretic.    ED Course  Procedures (including critical care time)  Medications  pantoprazole (PROTONIX) EC tablet 40 mg (not administered)  torsemide (DEMADEX) tablet 20 mg (not administered)  tiotropium (SPIRIVA) inhalation capsule 18 mcg (not administered)  polyethylene glycol (MIRALAX / GLYCOLAX) packet 17 g (not administered)  oxyCODONE-acetaminophen (PERCOCET/ROXICET) 5-325 MG per tablet 1 tablet (1 tablet Oral Given 01/13/15 0416)  ondansetron (ZOFRAN-ODT) disintegrating tablet 4 mg (not administered)  metoCLOPramide (REGLAN) tablet 5 mg (not administered)  mometasone-formoterol (DULERA) 100-5 MCG/ACT inhaler 2 puff (not administered)  albuterol (PROVENTIL HFA;VENTOLIN HFA) 108 (90 BASE) MCG/ACT inhaler 2 puff (not administered)  heparin injection 5,000 Units (not administered)  sodium chloride 0.9 % injection 3 mL (not administered)  predniSONE (DELTASONE) tablet 50 mg (50 mg Oral Given 01/13/15 0444)  furosemide (LASIX) injection 40 mg (40 mg Intravenous Given 01/13/15 0325)  iohexol (OMNIPAQUE) 350 MG/ML injection 100 mL (100 mLs  Intravenous Contrast Given 01/13/15 0227)  ipratropium-albuterol (DUONEB) 0.5-2.5 (3) MG/3ML nebulizer solution 3 mL (3 mLs Nebulization Given 01/13/15 0407)    Labs Review Labs Reviewed  CBC - Abnormal; Notable for the following:    WBC 11.8 (*)    All other components within normal limits  COMPREHENSIVE METABOLIC PANEL - Abnormal; Notable for the following:    Chloride 93 (*)    Glucose, Bld 101 (*)    Creatinine, Ser 0.46 (*)    ALT 46 (*)    All other components within normal limits  BRAIN NATRIURETIC PEPTIDE  I-STAT TROPOININ, ED    Imaging Review Ct Angio Chest Pe W/cm &/or Wo Cm  01/13/2015   CLINICAL DATA:  Acute onset of shortness of breath for 3 days. Mild leukocytosis. Initial encounter.  EXAM: CT ANGIOGRAPHY CHEST WITH CONTRAST  TECHNIQUE: Multidetector CT imaging of the chest was performed using the standard protocol during bolus administration of intravenous contrast. Multiplanar CT image reconstructions and MIPs were obtained to evaluate the vascular anatomy.  CONTRAST:  OMNIPAQUE IOHEXOL 350 MG/ML SOLN  COMPARISON:  Chest radiograph performed 12/14/2014, and CTA of the chest performed 12/27/2013  FINDINGS: There is no evidence of pulmonary embolus.  Mild emphysematous change is noted bilaterally. A rounded focus of ground-glass opacity is noted near the right lung apex, measuring 2.0 cm; this area has increased mildly in size from 2015, at which time it measured 1.7 cm, and from 2013, at which time it measured 1.4 cm. Given the slow but persistent growth over time, this raises concern for a gradually enlarging indolent bronchioloalveolar carcinoma, though this is unusual for the patient's age, and a very unusual infection or atypical adenomatous hyperplasia might have such an appearance.  Minimal underlying interstitial prominence is noted. Minimal honeycombing is also seen near both lung apices. There is no evidence of pleural effusion or pneumothorax.  A mildly prominent 1.1  cm right hilar node is seen. No mediastinal lymphadenopathy is identified. Trace pericardial fluid remains within normal limits. The main pulmonary outflow tract is mildly prominent, raising concern for pulmonary arterial hypertension. The great vessels are grossly unremarkable. No axillary lymphadenopathy is seen. The visualized portions of the thyroid gland are unremarkable in appearance.  Significantly decreased hepatic attenuation suggests diffuse fatty infiltration. The visualized portions of the spleen are unremarkable. A 7 mm node adjacent to the distal esophagus is of uncertain significance.  No acute osseous abnormalities are seen.  Review of the MIP images confirms the above findings.  IMPRESSION: 1. No evidence of pulmonary embolus. 2. 2.0 cm focus of ground-glass opacity seen near the right lung apex. This focus has  gradually increased in size over the past 2 1/2 years, up from 1.4 cm. Given the slow but persistent growth over time, this raises concern for a gradually enlarging indolent bronchioloalveolar cell carcinoma. However, this would be unusual for the patient's age. A very unusual infection or atypical adenomatous hyperplasia might have a similar appearance. Pulmonary consultation is recommended. 3. Minimal underlying interstitial prominence; mild emphysematous change seen bilaterally. Minimal honeycombing near the lung apices. 4. Mildly prominent 1.1 cm right hilar node seen. 5. 7 mm node adjacent to the distal esophagus, of uncertain significance. This may have increased slightly in size. 6. Diffuse fatty infiltration within the liver.   Electronically Signed   By: Roanna Raider M.D.   On: 01/13/2015 03:10     EKG Interpretation   Date/Time:  Monday January 13 2015 00:40:04 EST Ventricular Rate:  115 PR Interval:  162 QRS Duration: 84 QT Interval:  341 QTC Calculation: 472 R Axis:   102 Text Interpretation:  Sinus tachycardia Biatrial enlargement Probable  lateral infarct, old  No significant change since last tracing Confirmed by  Anitra Lauth  MD, Alphonzo Lemmings ([id number]) on 01/13/2015 2:03:27 AM      CRITICAL CARE Performed by: Francee Piccolo L   Total critical care time: 35 minutes  Critical care time was exclusive of separately billable procedures and treating other patients.  Critical care was necessary to treat or prevent imminent or life-threatening deterioration.  Critical care was time spent personally by me on the following activities: development of treatment plan with patient and/or surrogate as well as nursing, discussions with consultants, evaluation of patient's response to treatment, examination of patient, obtaining history from patient or surrogate, ordering and performing treatments and interventions, ordering and review of laboratory studies, ordering and review of radiographic studies, pulse oximetry and re-evaluation of patient's condition.  MDM   Final diagnoses:  Acute on chronic respiratory failure, unspecified whether with hypoxia or hypercapnia    Filed Vitals:   01/13/15 0400  BP: 134/92  Pulse: 116  Temp:   Resp: 27   I have reviewed nursing notes, vital signs, and all appropriate lab and imaging results for this patient.  Patient presenting with shortness breath worsening x 3 days. She has diminished breath sounds diffusely with accessory muscle use. Patient is requiring constant supplemental oxygen between 5-7L/minute, otherwise oxygen saturations dropping below 80%. Troponin negative. EKG remarkable for sinus tachycardia, otherwise no change. BNP wnl. No evidence of CHF exacerbation with fluid overload on physical examination. CT scan without evidence of PE, discussed with patient that the nodule has increased in size since her last CT scan one year ago. Duoneb administered. Patient will be admitted to hospitalist for further management and evaluation of symptoms. Patient d/w with Dr. Anitra Lauth, agrees with plan.      Jeannetta Ellis, PA-C 01/13/15 6045  Gwyneth Sprout, MD 01/13/15 [phone]

## 2015-01-14 ENCOUNTER — Telehealth: Payer: Self-pay

## 2015-01-14 DIAGNOSIS — R918 Other nonspecific abnormal finding of lung field: Secondary | ICD-10-CM

## 2015-01-14 MED ORDER — SORBITOL 70 % SOLN
20.0000 mL | Freq: Every day | Status: DC
  Administered 2015-01-14: 20 mL via ORAL
  Filled 2015-01-14 (×2): qty 30

## 2015-01-14 MED ORDER — ACETAMINOPHEN 500 MG PO TABS
500.0000 mg | ORAL_TABLET | Freq: Four times a day (QID) | ORAL | Status: DC | PRN
  Administered 2015-01-14: 500 mg via ORAL
  Filled 2015-01-14: qty 1

## 2015-01-14 NOTE — Progress Notes (Signed)
[facility]  S: Feeling a little better, still dyspnic with walking O: Filed Vitals:   01/13/15 2153 01/13/15 2320 01/14/15 0512 01/14/15 0744  BP: 111/74  104/64   Pulse: 96 81 73   Temp: 97.9 F (36.6 C)  97.7 F (36.5 C)   TempSrc: Oral  Oral   Resp: 18 18 18    Height:      Weight:   88.225 kg (194 lb 8 oz)   SpO2: 93% 97% 95% 95%   3L O2  Gen: comfortable in bed HEENT: NCAT, strabismus Pulm: mild wheezing bilaterally, good air movement CV: RRR, no mgr, cannot assess JVD Ext: improved edema AB: BS+, soft Neuro: A&Ox4, maew  Impression: 1) AE COPD exacerbation> improved with steroids, bronchodilators 2) Cor pulmonale 3) Pulmonary nodule> she is quite anxious about this; explained to patient at length that this is concerning given growth, but that any sort of biopsy will need to wait until after she recovers fully from COPD exacerbation.  Plan: 1) Wean O2 for O2 saturation > 90% 2) wean off steroids over next 7 days 3) I have ordered a PET/CT.  If this cannot be accomplished as an inpatient then she needs to have this scheduled as an outpatient 4) Ambulate as able 5) Could like d/c home within 24 hours  PCCM will sign off.  I am making arrangements for outpatient f/u with Dr. Marchelle Gearingamaswamy within 2 weeks.  Heber CarolinaBrent Carlene Bickley, MD [facility] Pager: [phone] Cell: [phone] If no response, call [phone]

## 2015-01-14 NOTE — Clinical Documentation Improvement (Signed)
Supporting Information: Patient with chronic right sided CHF noted per 02/08 progress notes.   Possible Clinical Condition? . Document acuity --Acute --Chronic --Acute on Chronic . Document type --Diastolic --Systolic --Combined systolic and diastolic . Due to or associated with --Cardiac or other surgery --Hypertension --Valvular disease --Rheumatic heart disease Endocarditis (valvitis) Pericarditis Myocarditis --Other (specify)    Thank Gabriel CirriYou,  Elleen Coulibaly Mathews-Bethea,RN,BSN,Clinical Documentation Specialist [phone] [email or web address] [email or web address]

## 2015-01-14 NOTE — Progress Notes (Signed)
RT placed patient on BIPAP. Sterile water added for humidification. 3 liter bleed into tubing. Patient is tolerating well. RT will assess and monitor as needed.

## 2015-01-14 NOTE — Telephone Encounter (Signed)
-----   Message from Lupita Leashouglas B McQuaid, MD sent at 01/14/2015 11:06 AM EST ----- Hi,  She was hospitalized for COPD exacerbation, was found to have a growing nodule.  Needs outpatient PET scan and f/u with Dr. Marchelle Gearingamaswamy or Tammy in 2 weeks.  Thanks [employer] & CoBrent

## 2015-01-14 NOTE — Progress Notes (Signed)
During ambulation, patient's HR in the 130s when walking in the hall, patient denies any distress; Dr. Mahala MenghiniSamtani informed.

## 2015-01-14 NOTE — Telephone Encounter (Signed)
atc pt, vm full.  Will hold in triage to call again tomorrow, wait to order pet after speaking to pt.

## 2015-01-14 NOTE — Progress Notes (Signed)
Mary HakimMichelle L [name] YQM:[id number] DOB: 02/09/1976 DOA: 01/13/2015 PCP: Elvina SidleLAUENSTEIN,KURT, MD  Brief narrative:  39 y/o ? known h/o Obesity BMI ~32, Former smoker 25 yrs, Gold stage 'b'-'c' COPD, COr pulmonale/Pulm HTN, h/o RUE DVT 03/2012, OSA on Bipap--Recent Hospitalization 12/13/14-->1/13/41for Obsitpation/Gerd Gastroparesis thoguth secondary to narcotic Readmitted early 01/13/15 a.m. With the COPD, worsening DOE and ? Oxygen requirement Pulmonary was consulted re ? mass/management   Past medical history-As per Problem list Chart reviewed as below- reviewed  Consultants:  Pulmonary  Procedures:  CT angiogram chest  Antibiotics:  none   Subjective   Feels a little better than yesterday Has a headache Has abdominal discomfort which she cannot really specify Also has some burning in the stomach No nausea No diarrhea    Objective    Interim History:   Telemetry: Sinus   Objective: Filed Vitals:   01/13/15 2153 01/13/15 2320 01/14/15 0512 01/14/15 0744  BP: 111/74  104/64   Pulse: 96 81 73   Temp: 97.9 F (36.6 C)  97.7 F (36.5 C)   TempSrc: Oral  Oral   Resp: 18 18 18    Height:      Weight:   88.225 kg (194 lb 8 oz)   SpO2: 93% 97% 95% 95%   No intake or output data in the 24 hours ending 01/14/15 1430  Exam:  General: Morbid obesity, Body mass index is 34.99 kg/(m^2). pleasant alert oriented no distress Cardiovascular: S1-S2 no murmur rub or gallop Respiratory: Clinically clear no wheeze Abdomen: Soft nontender nondistended no rebound Skin no lower extremity edema Neuro intact  Data Reviewed: Basic Metabolic Panel:  Recent Labs Lab 01/13/15 0049  NA 135  K 4.1  CL 93*  CO2 31  GLUCOSE 101*  BUN 6  CREATININE 0.46*  CALCIUM 9.2   Liver Function Tests:  Recent Labs Lab 01/13/15 0049  AST 37  ALT 46*  ALKPHOS 68  BILITOT 0.8  PROT 6.9  ALBUMIN 3.8   No results for input(s): LIPASE, AMYLASE in the last 168 hours. No results for  input(s): AMMONIA in the last 168 hours. CBC:  Recent Labs Lab 01/13/15 0049  WBC 11.8*  HGB 14.9  HCT 45.8  MCV 99.3  PLT 163   Cardiac Enzymes: No results for input(s): CKTOTAL, CKMB, CKMBINDEX, TROPONINI in the last 168 hours. BNP: Invalid input(s): POCBNP CBG: No results for input(s): GLUCAP in the last 168 hours.  No results found for this or any previous visit (from the past 240 hour(s)).   Studies:              All Imaging reviewed and is as per above notation   Scheduled Meds: . antiseptic oral rinse  7 mL Mouth Rinse BID  . arformoterol  15 mcg Nebulization BID  . budesonide (PULMICORT) nebulizer solution  0.5 mg Nebulization BID  . heparin  5,000 Units Subcutaneous 3 times per day  . metoCLOPramide  5 mg Oral QID  . pantoprazole  40 mg Oral Daily  . polyethylene glycol  17 g Oral BID  . predniSONE  50 mg Oral Q breakfast  . sodium chloride  3 mL Intravenous Q12H  . tiotropium  18 mcg Inhalation Daily  . torsemide  20 mg Oral Daily   Continuous Infusions:    Assessment/Plan: 1. Multifactorial dyspnea, grade 3-4-combination AECHF + AECOPD-imporivng. See below 2. Acute exacerbation Diastolic CHF + Cor Pulmonale-continue Demadex 20 qd 3. AE COPD-likely Gold stage C -Appreciate pulm recs-Continue brovana 15 mcg, Pulmicort  0.5 twice a day, prednisone 50 every morning--will need to taper on discharge-continue Spiriva 18 g daily 4. Recent diagnosis gastroparesis with mild abdominal pain 2/9-continue Reglan 5 mg 4 times a day-careful with QTC however has no arrhythmias on telemetry have discontinued the same 5. Right upper extremity DVT-defer to primary physician regarding anticoagulation-this sounds like it was provoked by a indwelling catheter. Should not need secondary prevention with aspirin 6. Reflux-continue pantoprazole 40 daily  Code Status: Full Family Communication: None present today Disposition Plan: Likely be able to discharge 01/15/15 if all  stable   Pleas Koch, MD  [facility] Pager [phone] 01/14/2015, 2:30 PM    LOS: 1 day

## 2015-01-15 MED ORDER — PREDNISONE 20 MG PO TABS: 40.0000 mg | ORAL_TABLET | Freq: Every day | ORAL | Status: DC

## 2015-01-15 NOTE — Telephone Encounter (Signed)
Spoke with pt and advised of Dr Ulyses JarredMcQuaid's recommendations.  Outpatient PET ordered and f/u with TP scheduled.

## 2015-01-15 NOTE — Discharge Summary (Signed)
Physician Discharge Summary  Mary HakimMichelle L [name] ZOX:[id number] DOB: 03/07/1976 DOA: 01/13/2015  PCP: Elvina SidleLAUENSTEIN,KURT, MD  Admit date: 01/13/2015 Discharge date: 01/15/2015  Time spent: 45 minutes  Recommendations for Outpatient Follow-up:  1. Follow up with PCP in 2 weeks 2. Follow up with Pulmonology in 2 weeks 3. PET scan as an outpatient  4. Advised for continuous O2   Discharge Diagnoses:  Principal Problem:   COPD exacerbation Active Problems:   Chronic respiratory failure with hypercapnia   Chronic right-sided CHF (congestive heart failure)   Acute on chronic respiratory failure with hypoxia   Other emphysema   Acute respiratory failure   Pulmonary mass  Discharge Condition: stable  Diet recommendation: regular  Filed Weights   01/13/15 0847 01/14/15 0512 01/15/15 0631  Weight: 87.1 kg (192 lb 0.3 oz) 88.225 kg (194 lb 8 oz) 89.268 kg (196 lb 12.8 oz)   History of present illness:  Mary HakimMichelle L [name] is a 39 y.o. female with h/o emphyzematous COPD on 2-3L home O2 at baseline despite her young age, cirrhosis, does NOT have alpha-1-antitripsin deficiency. Patient presents to the ED for worsening SOB over the last 3 days with worsening DOE. Typically only requires home O2 at night time or intermittently and uses BIPAP at night for OSA. For last 3 days has required it at all times. Amount required has increased to 4L. Has nasal congestion, severe wheezing and intermittent dry, non-productive cough. No fever. Nebs havent helped at home.  Hospital Course:  Acute on chronic hypoxic respiratory failure in he setting of COPD exacerbation and acute on chronic diastolic CHF- she has improved with steroids and breathing treatments, asking to go home today. Pulmonology was consulted and has followed patient while hospitalized and felt like she is stable for discharge. She feels like her respiratory status is at baseline this morning and is already on home O2 On the day of discharge  she is requiring 2.5 L of oxygen which is her home doses as well. She will be discharged on a steroid taper and have recommended Pulmonology follow up in 1-2 weeks. Of note, she was found to have a lung ground glass opacity on the CT chest, 2.0 cm, increased in size when compared to previous, she is to have a PET scan as an outpatient per Dr. Kendrick FriesMcQuaid.    Procedures:  None    Consultations:  Pulmonology   Discharge Exam: Filed Vitals:   01/15/15 0514 01/15/15 0631 01/15/15 0750 01/15/15 0800  BP: 102/63     Pulse: 63     Temp: 97.7 F (36.5 C)     TempSrc: Oral     Resp: 18     Height:      Weight:  89.268 kg (196 lb 12.8 oz)    SpO2: 98%  94% 94%    General: NAD Cardiovascular: RRR Respiratory: decreased breath sounds, minimal wheezing  Discharge Instructions     Medication List    TAKE these medications        albuterol 108 (90 BASE) MCG/ACT inhaler  Commonly known as:  PROVENTIL HFA;VENTOLIN HFA  Inhale 2 puffs into the lungs every 6 (six) hours as needed for wheezing or shortness of breath.     Fluticasone-Salmeterol 250-50 MCG/DOSE Aepb  Commonly known as:  ADVAIR  Inhale 1 puff into the lungs every 12 (twelve) hours.     metoCLOPramide 5 MG tablet  Commonly known as:  REGLAN  Take 1 tablet (5 mg total) by mouth 4 (four) times  daily.     ondansetron 8 MG disintegrating tablet  Commonly known as:  ZOFRAN ODT  Take 0.5 tablets (4 mg total) by mouth every 8 (eight) hours as needed for nausea.     oxyCODONE-acetaminophen 5-325 MG per tablet  Commonly known as:  PERCOCET/ROXICET  Take 1 tablet by mouth every 8 (eight) hours as needed for severe pain.     pantoprazole 40 MG tablet  Commonly known as:  PROTONIX  Take 1 tablet (40 mg total) by mouth daily.     polyethylene glycol packet  Commonly known as:  MIRALAX / GLYCOLAX  Take 17 g by mouth 2 (two) times daily.     predniSONE 20 MG tablet  Commonly known as:  DELTASONE  Take 2 tablets (40 mg total)  by mouth daily with breakfast. Take 2 tablets daily for 3 days then 1 tablet daily for 3 days then half tablet daily for 4 days     tiotropium 18 MCG inhalation capsule  Commonly known as:  SPIRIVA  Place 1 capsule (18 mcg total) into inhaler and inhale daily.     torsemide 20 MG tablet  Commonly known as:  DEMADEX  Take 1 tablet (20 mg total) by mouth daily.           Follow-up Information    Follow up with Elvina Sidle, MD. Schedule an appointment as soon as possible for a visit in 2 weeks.   Specialty:  Family Medicine   Contact information:   [address] [place] [address] [phone]       Follow up with [facility], MD. Schedule an appointment as soon as possible for a visit in 2 weeks.   Specialty:  Pulmonary Disease   Contact information:   [address] [place] [address] [phone]       The results of significant diagnostics from this hospitalization (including imaging, microbiology, ancillary and laboratory) are listed below for reference.    Significant Diagnostic Studies: Ct Angio Chest Pe W/cm &/or Wo Cm  01/13/2015   CLINICAL DATA:  Acute onset of shortness of breath for 3 days. Mild leukocytosis. Initial encounter.  EXAM: CT ANGIOGRAPHY CHEST WITH CONTRAST  TECHNIQUE: Multidetector CT imaging of the chest was performed using the standard protocol during bolus administration of intravenous contrast. Multiplanar CT image reconstructions and MIPs were obtained to evaluate the vascular anatomy.  CONTRAST:  OMNIPAQUE IOHEXOL 350 MG/ML SOLN  COMPARISON:  Chest radiograph performed 12/14/2014, and CTA of the chest performed 12/27/2013  FINDINGS: There is no evidence of pulmonary embolus.  Mild emphysematous change is noted bilaterally. A rounded focus of ground-glass opacity is noted near the right lung apex, measuring 2.0 cm; this area has increased mildly in size from 2015, at which time it measured 1.7 cm, and from 2013, at which time it  measured 1.4 cm. Given the slow but persistent growth over time, this raises concern for a gradually enlarging indolent bronchioloalveolar carcinoma, though this is unusual for the patient's age, and a very unusual infection or atypical adenomatous hyperplasia might have such an appearance.  Minimal underlying interstitial prominence is noted. Minimal honeycombing is also seen near both lung apices. There is no evidence of pleural effusion or pneumothorax.  A mildly prominent 1.1 cm right hilar node is seen. No mediastinal lymphadenopathy is identified. Trace pericardial fluid remains within normal limits. The main pulmonary outflow tract is mildly prominent, raising concern for pulmonary arterial hypertension. The great vessels are grossly unremarkable. No axillary lymphadenopathy  is seen. The visualized portions of the thyroid gland are unremarkable in appearance.  Significantly decreased hepatic attenuation suggests diffuse fatty infiltration. The visualized portions of the spleen are unremarkable. A 7 mm node adjacent to the distal esophagus is of uncertain significance.  No acute osseous abnormalities are seen.  Review of the MIP images confirms the above findings.  IMPRESSION: 1. No evidence of pulmonary embolus. 2. 2.0 cm focus of ground-glass opacity seen near the right lung apex. This focus has gradually increased in size over the past 2 1/2 years, up from 1.4 cm. Given the slow but persistent growth over time, this raises concern for a gradually enlarging indolent bronchioloalveolar cell carcinoma. However, this would be unusual for the patient's age. A very unusual infection or atypical adenomatous hyperplasia might have a similar appearance. Pulmonary consultation is recommended. 3. Minimal underlying interstitial prominence; mild emphysematous change seen bilaterally. Minimal honeycombing near the lung apices. 4. Mildly prominent 1.1 cm right hilar node seen. 5. 7 mm node adjacent to the distal  esophagus, of uncertain significance. This may have increased slightly in size. 6. Diffuse fatty infiltration within the liver.   Electronically Signed   By: Roanna Raider M.D.   On: 01/13/2015 03:10   Labs: Basic Metabolic Panel:  Recent Labs Lab 01/13/15 0049  NA 135  K 4.1  CL 93*  CO2 31  GLUCOSE 101*  BUN 6  CREATININE 0.46*  CALCIUM 9.2   Liver Function Tests:  Recent Labs Lab 01/13/15 0049  AST 37  ALT 46*  ALKPHOS 68  BILITOT 0.8  PROT 6.9  ALBUMIN 3.8   CBC:  Recent Labs Lab 01/13/15 0049  WBC 11.8*  HGB 14.9  HCT 45.8  MCV 99.3  PLT 163   BNP: BNP (last 3 results)  Recent Labs  12/10/14 1827 01/13/15 0049  BNP 48.5 86.5    Signed:  GHERGHE, COSTIN  [facility] 01/15/2015, 6:28 PM

## 2015-01-15 NOTE — Discharge Instructions (Signed)
Follow with Elvina SidleLAUENSTEIN,KURT, MD in 5-7 days  Please get a complete blood count and chemistry panel checked by your Primary MD at your next visit, and again as instructed by your Primary MD. Please get your medications reviewed and adjusted by your Primary MD.  Please request your Primary MD to go over all Hospital Tests and Procedure/Radiological results at the follow up, please get all Hospital records sent to your Prim MD by signing hospital release before you go home.  If you had Pneumonia of Lung problems at the Hospital: Please get a 2 view Chest X ray done in 6-8 weeks after hospital discharge or sooner if instructed by your Primary MD.  If you have Congestive Heart Failure: Please call your Cardiologist or Primary MD anytime you have any of the following symptoms:  1) 3 pound weight gain in 24 hours or 5 pounds in 1 week  2) shortness of breath, with or without a dry hacking cough  3) swelling in the hands, feet or stomach  4) if you have to sleep on extra pillows at night in order to breathe  Follow cardiac low salt diet and 1.5 lit/day fluid restriction.  If you have diabetes Accuchecks 4 times/day, Once in AM empty stomach and then before each meal. Log in all results and show them to your primary doctor at your next visit. If any glucose reading is under 80 or above 300 call your primary MD immediately.  If you have Seizure/Convulsions/Epilepsy: Please do not drive, operate heavy machinery, participate in activities at heights or participate in high speed sports until you have seen by Primary MD or a Neurologist and advised to do so again.  If you had Gastrointestinal Bleeding: Please ask your Primary MD to check a complete blood count within one week of discharge or at your next visit. Your endoscopic/colonoscopic biopsies that are pending at the time of discharge, will also need to followed by your Primary MD.  Get Medicines reviewed and adjusted. Please take all your  medications with you for your next visit with your Primary MD  Please request your Primary MD to go over all hospital tests and procedure/radiological results at the follow up, please ask your Primary MD to get all Hospital records sent to his/her office.  If you experience worsening of your admission symptoms, develop shortness of breath, life threatening emergency, suicidal or homicidal thoughts you must seek medical attention immediately by calling 911 or calling your MD immediately  if symptoms less severe.  You must read complete instructions/literature along with all the possible adverse reactions/side effects for all the Medicines you take and that have been prescribed to you. Take any new Medicines after you have completely understood and accpet all the possible adverse reactions/side effects.   Do not drive or operate heavy machinery when taking Pain medications.   Do not take more than prescribed Pain, Sleep and Anxiety Medications  Special Instructions: If you have smoked or chewed Tobacco  in the last 2 yrs please stop smoking, stop any regular Alcohol  and or any Recreational drug use.  Wear Seat belts while driving.  Please note You were cared for by a hospitalist during your hospital stay. If you have any questions about your discharge medications or the care you received while you were in the hospital after you are discharged, you can call the unit and asked to speak with the hospitalist on call if the hospitalist that took care of you is not available. Once you  are discharged, your primary care physician will handle any further medical issues. Please note that NO REFILLS for any discharge medications will be authorized once you are discharged, as it is imperative that you return to your primary care physician (or establish a relationship with a primary care physician if you do not have one) for your aftercare needs so that they can reassess your need for medications and monitor your  lab values.  You can reach the hospitalist office at phone [phone] or fax [phone]   If you do not have a primary care physician, you can call [phone] for a physician referral.  Activity: As tolerated with Full fall precautions use walker/cane & assistance as needed  Diet: regular  Disposition Home

## 2015-01-18 ENCOUNTER — Encounter (HOSPITAL_COMMUNITY)
Admission: RE | Admit: 2015-01-18 | Discharge: 2015-01-18 | Disposition: A | Discharge status: Home / Self Care | Payer: 59 | Source: Ambulatory Visit | Attending: Internal Medicine | Admitting: Internal Medicine

## 2015-01-18 ENCOUNTER — Ambulatory Visit (HOSPITAL_COMMUNITY): Payer: 59

## 2015-01-18 DIAGNOSIS — R911 Solitary pulmonary nodule: Secondary | ICD-10-CM | POA: Insufficient documentation

## 2015-01-18 DIAGNOSIS — R918 Other nonspecific abnormal finding of lung field: Secondary | ICD-10-CM

## 2015-01-18 LAB — GLUCOSE, CAPILLARY: Glucose-Capillary: 92 mg/dL (ref 70–99)

## 2015-01-18 MED ORDER — FLUDEOXYGLUCOSE F - 18 (FDG) INJECTION
9.8000 | Freq: Once | INTRAVENOUS | Status: AC | PRN
  Administered 2015-01-18: 9.8 via INTRAVENOUS

## 2015-01-20 ENCOUNTER — Telehealth: Payer: Self-pay | Admitting: Internal Medicine

## 2015-01-20 NOTE — Telephone Encounter (Signed)
[phone], patient cb

## 2015-01-20 NOTE — Telephone Encounter (Signed)
I called Ms. Mary [name] to explain that the PET scan did not show hypermetabolic uptake but she may still need a biopsy.  She voiced understanding.  She plans to see [name] on 01/30/15.

## 2015-01-20 NOTE — Telephone Encounter (Signed)
Spoke with pt - She had a PET scan on Saturday.  Reports she was advised she would be informed of results by today.  Pt aware MR is not in office until tomorrow and is requesting at least prelim results by one of his partners today until further recs can be given by MR.  Dr. Kendrick FriesMcQuaid, please advise.  Thank you.

## 2015-01-21 NOTE — Telephone Encounter (Signed)
Mary [name]  She is only 38 bbut biologically like a 39 year old wih bad copd and on several L O2. She had a new GGO with negative PET. ? Good candidate for PERCEPTA test  Let me know  Thanks  M

## 2015-01-22 NOTE — Telephone Encounter (Signed)
MR - If you perform FOB on this pt for either washings or for biopsy then I would recommend that you also perform percepta brushings. This will help you assess risk for malignancy. Yo0u could also consider running the PAULA test that is available at our office.

## 2015-01-28 ENCOUNTER — Ambulatory Visit (HOSPITAL_COMMUNITY): Payer: 59

## 2015-01-30 ENCOUNTER — Encounter: Payer: Self-pay | Admitting: Adult Health

## 2015-01-30 ENCOUNTER — Ambulatory Visit (INDEPENDENT_AMBULATORY_CARE_PROVIDER_SITE_OTHER): Payer: 59 | Admitting: Adult Health

## 2015-01-30 VITALS — BP 104/72 | HR 99 | Temp 98.8°F | Ht 62.5 in | Wt 193.0 lb

## 2015-01-30 DIAGNOSIS — J438 Other emphysema: Secondary | ICD-10-CM

## 2015-01-30 DIAGNOSIS — J9612 Chronic respiratory failure with hypercapnia: Secondary | ICD-10-CM

## 2015-01-30 DIAGNOSIS — R911 Solitary pulmonary nodule: Secondary | ICD-10-CM

## 2015-01-30 NOTE — Assessment & Plan Note (Signed)
Compensated on oxygen 

## 2015-01-30 NOTE — Patient Instructions (Signed)
#  COPD  - - continue oxygen   - continue advair   - continue spiriva 1 puff daily  Lung Nodule  -We will be in touch for further testing.   Follow up Dr. Marchelle Gearingamaswamy in 6 weeks and As needed

## 2015-01-30 NOTE — Progress Notes (Signed)
Subjective:    Patient ID: Mary [name], female    DOB: 03/17/1976, 39 y.o.   MRN: [id number]  HPI  #Obesity  - Body mass index is 32.07 kg/(m^2). on Nov 2013 - Body mass index is 32.95 kg/(m^2). on 06/04/2013    #Ex-Smoker  - age 39 to [age]; 25 pack year smoking hx  # Hx of Congenital Heart Surgery NOS   - parents deceased. No idea what surgery she had  #COPD - Alpha 1 is MM - checked May 2013. per hx her dad had copd.  - PFTS 03/08/12: fev1 0.58L/1%, FVC 1.18/33%, Ratop 49 and c/w ssevere obstruction. 21% BD response on FVC, RV 219%, DLCO 11/54% - PFT 04/10/12: fev1 0.54L/18%, 31% BD response, RATio 49, TL 88%, RV 204$, DLCO 30%,  - Abg baseline MAy 2013: 7.43/50/81 - Rx ? On nocturnal cpap/bipap  #Pulmonary Hypertension/Cor Pulmonale - -  - CT chest 03/06/12  IMPRESSION:  Thrombosed right subclavian vein.  Marked right chest wall edema involving the right breast and right  axilla. Right axillary adenopathy.  No pulmonary embolus (probable motion artifact involving the right  lower lobe pulmonary vessels).  Mediastinal and right hilar adenopathy.  Scattered pulmonary parenchymal changes without dominant worrisome  mass. Patchy parenchymal changes most notable right upper lobe.  This can be reevaluated follow-up after acute episode has cleared.  Small right-sided pleural effusion. Right heart enlargement. Small  superiorly located pericardial effusion.  Ascites. Slight lobularity liver without other findings of  cirrhosis. Mild third spacing of fluid abdominal wall.  Original Report Authenticated By: Fuller CanadaSTEVEN R. OLSON, M.   ECHO 03/06/12:  - Left ventricle: Systolic function was normal. The estimated ejection fraction was in the range of 55% to 60%. - Ventricular septum: Septal motion showed paradox. - Right ventricle: The cavity size was moderately dilated. Systolic function was reduced. - Right atrium: The atrium was moderately dilated   ECHO 5/813 - Normal LV but  RV severely dilated  Right Heart cath 03/07/12  Hemodynamic Findings:  Ao: (cuff) 111/84  LV: not performed  RA: 23  RV: 59/12/25  PA: 64/45 (mean 53)  PCWP: 18  Fick Cardiac Output: 5.0 L/min  Fick Cardiac Index: 2.76 L/min/m2  Peripheral Aortic Saturation: 90%  Pulmonary Artery Saturation: 67%    HIV 03/22/12   - NEGATIVE  AUTOIMMUNE  - alpha 1 03/07/12 - (done during illness) - 342  - ANA, RF, scl 70 - April 2013 - NEGATIVE   VQ scan 04/12/12  - low prob for PE  US Abd 04/07/12  - normal with spleen at upper limit of normal at 12cm      # RUE DVT  - Duple UE 03/06/12: acute deep vein thrombosis involving the right upper extremity of the right subclaviian vein. Rx  Coumadin stopped July 2013 by a Dr Michaell CowingGross per her hx   #AECOPD  - several admits early 2013 including icu - May 2013  -opd RX - Oct 2013 - opd Rx  # ? Sleep Apnea  - SLEEP STUDY 03/29/12  Dr Maple HudsonYoung   IMPRESSIONS-RECOMMENDATION:  1. Sleep architecture shows predominance of stage II sleep interrupted  mostly by rapid eye movement in a pattern that suggests sedating  medication during the sleep period, although the none is listed.  2. Occasional respiratory events with sleep disturbance, within normal  limits. Apnea-hypopnea index 4.6 per hour (the normal range for  adults is from 0-5 events per hour). Moderate snoring with oxygen  desaturation to  a nadir of 73% and mean oxygen saturation through  the study of 93% wearing supplemental oxygen.  3. The patient wore nasal oxygen at 4 L per minute throughout the  study as ordered. On arrival while upright, saturation was 97%.  During this study, she desaturated to a nadir of 73% with a mean  throughout the study of 93% saturation while wearing oxygen at 4 L  per minute by nasal prongs. During the total recording time,  oxygen saturation was less than 90% for 51.3 minutes and less than  88% for 38.9 minutes while wearing supplemental oxygen.     #Difficult  Social Situation  - Oct 2013:  husband in and out of MD visits and hospitalizations: diagnosed as mass in testicular wall and polyps; all bengin but mass effect. But now back to work and lot better. Financial situation - better. Says she has gas $ to make it to doc appointments. Unemployed after copd diagnosis. Unable to get disability due to husband job (he earns too much for SSI,). She is not able to get SSD due to lack of work credits   OV 09/27/2014  Chief Complaint  Patient presents with  . Follow-up    Pt states her breathing is unchanged. Pt wearing Bipap nightly and denies issues. Pt states she only has SOB with over exertion, pt also c/o mild cough. Pt denies Cp/tightness.    Followup COPD with chronic respiratory failure. Difficult social situation and therefore not a transplant candidate  - I last saw her in June 2014. Since the overall stable but apparently 6 weeks ago had a COPD exacerbation was treated at an urgent care with antibiotics and prednisone. Currently reporting stable COPD health. COPD cat score 19 and reflects moderate copd symptom  but stable COPD. She has not had a flu shot but does agree to have it today. She is on Spiriva and Advair uses it regularly but when she runs out she has trouble replacing it. Currently not on Advair. Discussed copd research trials: she is very interested    CAT COPD Symptom & Quality of Life Score ([employer] trademark) 0 is no burden. 5 is highest burden 09/27/2014   Never Cough -> Cough all the time 3  No phlegm in chest -> Chest is full of phlegm 3  No chest tightness -> Chest feels very tight 2  No dyspnea for 1 flight stairs/hill -> Very dyspneic for 1 flight of stairs 5  No limitations for ADL at home -> Very limited with ADL at home 2  Confident leaving home -> Not at all confident leaving home 0  Sleep soundly -> Do not sleep soundly because of lung condition 1  Lots of Energy -> No energy at all 3  TOTAL Score (max 40)  19     01/30/2015 Post Hospital follow up : Severe COPD , oxygen dependent, diastolic congestive heart failure Patient returns for a post hospital follow-up She was admitted February 8 to February 10 for COPD exacerbation Patient was treated with antibiotics, steroids, nebulized bronchodilators. CT angio chest was negative for PE however showed a 2 cm groundglass opacity in the right lung apex  This has increased in size. A mildly prominent right hilar node . Patient underwent a PET scan on February 15 that was negative/not hypermetabolic. We discussed her test results today.  Since discharge she is some better but continues to be weak and have shortness of breath with activity. She is somewhat tearful during her exam today.  She remains on Advair and Spiriva.     Review of Systems  Constitutional: Negative for fever and unexpected weight change.  HENT: Negative for congestion, dental problem, ear pain, nosebleeds, postnasal drip, rhinorrhea, sinus pressure, sneezing, sore throat and trouble swallowing.   Eyes: Negative for redness and itching.  Respiratory: Positive for cough and shortness of breath. Negative for chest tightness and wheezing.   Cardiovascular: Negative for palpitations and leg swelling.  Gastrointestinal: Negative for nausea and vomiting.  Genitourinary: Negative for dysuria.  Musculoskeletal: Negative for joint swelling.  Skin: Negative for rash.  Neurological: Negative for headaches.  Hematological: Does not bruise/bleed easily.  Psychiatric/Behavioral: Negative for dysphoric mood. The patient is not nervous/anxious.         Objective:   Physical Exam GEN: A/Ox3; pleasant , NAD, chronically ill-appearing  HEENT:  [place]/AT,  EACs-clear, TMs-wnl, NOSE-clear, THROAT-clear, no lesions, no postnasal drip or exudate noted.   NECK:  Supple w/ fair ROM; no JVD; normal carotid impulses w/o bruits; no thyromegaly or nodules palpated; no lymphadenopathy.  RESP  decreased  breath sounds w/o, wheezes/ rales/ or rhonchi.no accessory muscle use, no dullness to percussion  CARD:  RRR, no m/r/g  , trace peripheral edema, pulses intact, no cyanosis or clubbing.  GI:   Soft & nt; nml bowel sounds; no organomegaly or masses detected.  Musco: Warm bil, no deformities or joint swelling noted.   Neuro: alert, no focal deficits noted.    Skin: Warm, no lesions or rashes

## 2015-01-30 NOTE — Assessment & Plan Note (Signed)
Severe COPD, oxygen dependent Reason exacerbation, now resolving  Plan Continue on Advair and Spiriva Continue  with oxygen therapy

## 2015-01-30 NOTE — Assessment & Plan Note (Addendum)
2 cm GGO in the right lung apex with notable growth PET scan was negative / not hypermetabolic We discussed her test results Case was discussed in detail with Dr. Marchelle Gearingamaswamy Plan will be to repeat her CT scan in 3 months . At that time. We will check cancer biomarkers.

## 2015-01-31 NOTE — Addendum Note (Signed)
Addended by: Boone MasterJONES, Nakya Weyand E on: 01/31/2015 11:13 AM   Modules accepted: Orders

## 2015-02-04 NOTE — Addendum Note (Signed)
Addended by: Boone MasterJONES, Donley Harland E on: 02/04/2015 03:46 PM   Modules accepted: Orders

## 2015-03-06 ENCOUNTER — Encounter (HOSPITAL_COMMUNITY): Payer: Self-pay | Admitting: Emergency Medicine

## 2015-03-06 ENCOUNTER — Emergency Department (INDEPENDENT_AMBULATORY_CARE_PROVIDER_SITE_OTHER)
Admission: EM | Admit: 2015-03-06 | Discharge: 2015-03-06 | Disposition: A | Discharge status: Home / Self Care | Payer: 59 | Source: Home / Self Care | Attending: Family Medicine | Admitting: Family Medicine

## 2015-03-06 DIAGNOSIS — M5442 Lumbago with sciatica, left side: Secondary | ICD-10-CM | POA: Diagnosis not present

## 2015-03-06 MED ORDER — PREDNISONE 10 MG PO KIT: PACK | ORAL | Status: DC

## 2015-03-06 MED ORDER — OXYCODONE-ACETAMINOPHEN 5-325 MG PO TABS: 2.0000 | ORAL_TABLET | ORAL | Status: DC | PRN

## 2015-03-06 NOTE — Discharge Instructions (Signed)
Thank you for coming in today. Come back or go to the emergency room if you notice new weakness new numbness problems walking or bowel or bladder problems.  Sciatica Sciatica is pain, weakness, numbness, or tingling along the path of the sciatic nerve. The nerve starts in the lower back and runs down the back of each leg. The nerve controls the muscles in the lower leg and in the back of the knee, while also providing sensation to the back of the thigh, lower leg, and the sole of your foot. Sciatica is a symptom of another medical condition. For instance, nerve damage or certain conditions, such as a herniated disk or bone spur on the spine, pinch or put pressure on the sciatic nerve. This causes the pain, weakness, or other sensations normally associated with sciatica. Generally, sciatica only affects one side of the body. CAUSES   Herniated or slipped disc.  Degenerative disk disease.  A pain disorder involving the narrow muscle in the buttocks (piriformis syndrome).  Pelvic injury or fracture.  Pregnancy.  Tumor (rare). SYMPTOMS  Symptoms can vary from mild to very severe. The symptoms usually travel from the low back to the buttocks and down the back of the leg. Symptoms can include:  Mild tingling or dull aches in the lower back, leg, or hip.  Numbness in the back of the calf or sole of the foot.  Burning sensations in the lower back, leg, or hip.  Sharp pains in the lower back, leg, or hip.  Leg weakness.  Severe back pain inhibiting movement. These symptoms may get worse with coughing, sneezing, laughing, or prolonged sitting or standing. Also, being overweight may worsen symptoms. DIAGNOSIS  Your caregiver will perform a physical exam to look for common symptoms of sciatica. He or she may ask you to do certain movements or activities that would trigger sciatic nerve pain. Other tests may be performed to find the cause of the sciatica. These may include:  Blood  tests.  X-rays.  Imaging tests, such as an MRI or CT scan. TREATMENT  Treatment is directed at the cause of the sciatic pain. Sometimes, treatment is not necessary and the pain and discomfort goes away on its own. If treatment is needed, your caregiver may suggest:  Over-the-counter medicines to relieve pain.  Prescription medicines, such as anti-inflammatory medicine, muscle relaxants, or narcotics.  Applying heat or ice to the painful area.  Steroid injections to lessen pain, irritation, and inflammation around the nerve.  Reducing activity during periods of pain.  Exercising and stretching to strengthen your abdomen and improve flexibility of your spine. Your caregiver may suggest losing weight if the extra weight makes the back pain worse.  Physical therapy.  Surgery to eliminate what is pressing or pinching the nerve, such as a bone spur or part of a herniated disk. HOME CARE INSTRUCTIONS   Only take over-the-counter or prescription medicines for pain or discomfort as directed by your caregiver.  Apply ice to the affected area for 20 minutes, 3-4 times a day for the first 48-72 hours. Then try heat in the same way.  Exercise, stretch, or perform your usual activities if these do not aggravate your pain.  Attend physical therapy sessions as directed by your caregiver.  Keep all follow-up appointments as directed by your caregiver.  Do not wear high heels or shoes that do not provide proper support.  Check your mattress to see if it is too soft. A firm mattress may lessen your pain  and discomfort. SEEK IMMEDIATE MEDICAL CARE IF:   You lose control of your bowel or bladder (incontinence).  You have increasing weakness in the lower back, pelvis, buttocks, or legs.  You have redness or swelling of your back.  You have a burning sensation when you urinate.  You have pain that gets worse when you lie down or awakens you at night.  Your pain is worse than you have  experienced in the past.  Your pain is lasting longer than 4 weeks.  You are suddenly losing weight without reason. MAKE SURE YOU:  Understand these instructions.  Will watch your condition.  Will get help right away if you are not doing well or get worse. Document Released: 11/16/2001 Document Revised: 05/23/2012 Document Reviewed: 04/02/2012 [facility] Patient Information 2015 [place], [place]. This information is not intended to replace advice given to you by your health care provider. Make sure you discuss any questions you have with your health care provider.   PRIMARY CARE Merchant navy officerDOCTORS [facility] [address]  [place], [place] [phone]  Fax [phone]  Nature conservation [facility] at [facility] [address]  [place], [place] [phone]  Fax [phone]  [facility] conservation [facility] at [place] / [place] [phone]  [place], [place] [phone]  Fax [phone]  [facility] [address]  [place], [place] [phone]  Fax [phone]  [facility] [address] [place]  [place], [place] [phone]  Fax [phone]  [facility] [address] [place]  [place], [place] [phone]  Fax [phone]   [facility] [place] [address] [place] [address] Phone: [phone]   [facility] @ [facility] [address] [place] [address] Phone: [phone]   [facility] @ [place] [address] [place] [address] Phone: [phone]   [facility] @ [place] [address] [place] [address] Phone: [phone]   [facility] @ [place] [address] [place] [address] Phone: [phone] Fax: [phone]   [facility] @ [place] [address] [place] [address] Phone: [phone]   Dr. Maryelizabeth RowanElizabeth Dewey [address] [place] [address] [phone]

## 2015-03-06 NOTE — ED Notes (Signed)
C/o lower back pain with shooting pain down right leg.  On set last night while walking up steps  States shooting pain went through back.  Pt has tried heat and ice.  With no relief.

## 2015-03-06 NOTE — ED Provider Notes (Signed)
Mary [name] is a 39 y.o. female who presents to Urgent Care today for back pain. Patient notes right low back pain radiating to the left leg. Symptoms started yesterday. Patient denies any injury. She felt the pain when she was climbing stairs. She denies any weakness or numbness bowel bladder dysfunction. She's tried some over-the-counter medications which have helped. Patient is an extremely poor health with right heart failure terrible COPD and oxygen-dependent status. She is at her baseline with no exacerbations in her respiratory status today.   Past Medical History  Diagnosis Date  . COPD (chronic obstructive pulmonary disease)     PFTS 03/08/12: fev1 0.58L/1%, FVC 1.18/33%, Ratop 49 and c/w ssevere obstruction. 21% BD response on FVC, RV 219%, DLCO 11/54%  . History of alcoholism   . Irregular menses   . DVT of upper extremity (deep vein thrombosis) April 2013    right subclavian // Unclear precipitating cause - possibly significant right heart failure, with vascular stasis potentially predisposing to clotting.    . Pulmonary embolus April 2013    Precipitating cause unclear. Was treated with coumadin from April-June 2013.  . Moderate to severe pulmonary hypertension April 2013    Cardiac cath on 03/06/12 - 1. Elevated pulmonary artery pressures, right sided filling pressures.,  2. PA: 64/45 (mean 53)    . Hepatic cirrhosis     Questionable history of - Noted on CT abdomen (03/2012) - thought to be due to vascular congestion from right heart failure +/- patient's history of alcohol abuse  . Axillary adenopathy     right axillary adenopathy noted on CT chest (03/06/2012)  . Periodontal disease   . Chronic right-sided CHF (congestive heart failure) 03/23/2012    with cor pulmonale. Last RHC 03/2012  . Pneumonia ~ 1985; 01/2011  . History of chronic bronchitis   . Exertional shortness of breath   . On home oxygen therapy     "2-3 L 24/7" (05/03/2013)  . OSA (obstructive sleep apnea)    wears noctural BiPAP (05/03/2013)  . Migraine     "~ 1/yr" (05/03/2013)   Past Surgical History  Procedure Laterality Date  . Cardiac surgery  10/20/77    "my heart was backwards" (05/03/2013)  . Lung surgery  May 17, 1977  . Appendectomy  05/03/2013  . Cardiac catheterization  03/2012  . Laparoscopic appendectomy N/A 05/03/2013    Procedure: APPENDECTOMY LAPAROSCOPIC;  Surgeon: Gwenyth Ober, MD;  Location: [place];  Service: General;  Laterality: N/A;  . Right heart catheterization N/A 03/07/2012    Procedure: RIGHT HEART CATH;  Surgeon: Burnell Blanks, MD;  Location: [facility] C[facility] LAB;  Service: Cardiovascular;  Laterality: N/A;   History  Substance Use Topics  . Smoking status: Former Smoker -- 1.00 packs/day for 25 years    Types: Cigarettes    Quit date: 02/18/2012  . Smokeless tobacco: Never Used  . Alcohol Use: No     Comment: previously drinking 1 pint 3-4 days a week for 2002-2013, but in 2014, no longer drinking   ROS as above Medications: No current facility-administered medications for this encounter.   Current Outpatient Prescriptions  Medication Sig Dispense Refill  . albuterol (PROVENTIL HFA;VENTOLIN HFA) 108 (90 BASE) MCG/ACT inhaler Inhale 2 puffs into the lungs every 6 (six) hours as needed for wheezing or shortness of breath. 8.5 g 1  . Fluticasone-Salmeterol (ADVAIR) 250-50 MCG/DOSE AEPB Inhale 1 puff into the lungs every 12 (twelve) hours. 60 each 6  . metoCLOPramide (REGLAN) 5  MG tablet Take 1 tablet (5 mg total) by mouth 4 (four) times daily. 120 tablet 0  . torsemide (DEMADEX) 20 MG tablet Take 1 tablet (20 mg total) by mouth daily. 90 tablet 3  . omeprazole (PRILOSEC) 20 MG capsule Take 20 mg by mouth daily as needed.    . ondansetron (ZOFRAN-ODT) 8 MG disintegrating tablet Take 0.5 tablets (4 mg total) by mouth every 8 (eight) hours as needed for nausea. 20 tablet 0  . oxyCODONE-acetaminophen (PERCOCET/ROXICET) 5-325 MG per tablet Take 2 tablets by mouth every  4 (four) hours as needed for severe pain. 15 tablet 0  . polyethylene glycol (MIRALAX / GLYCOLAX) packet Take 17 g by mouth 2 (two) times daily. 28 each 1  . PredniSONE 10 MG KIT 12 day dose pack po 1 kit 0  . tiotropium (SPIRIVA) 18 MCG inhalation capsule Place 1 capsule (18 mcg total) into inhaler and inhale daily. 30 capsule 5   No Known Allergies   Exam:  BP 123/78 mmHg  Pulse 93  Temp(Src) 98.8 F (37.1 C) (Oral)  Resp 16  SpO2 98%  LMP 03/03/2015 Gen: Well NAD Back: Nontender to spinal midline. Tender palpation right SI joint. Nontender left. Negative straight leg raise test and Faber test bilaterally. Lower extremity reflexes are equal and normal bilaterally. Sensation is intact throughout. Antalgic gait. Low back range of motion is diminished due to pain.  No results found for this or any previous visit (from the past 24 hour(s)). No results found.  Assessment and Plan: 39 y.o. female with lumbago with left-sided sciatica. Treat with prednisone dose pack, and oxycodone. Follow-up with orthopedics as needed.  Discussed warning signs or symptoms. Please see discharge instructions. Patient expresses understanding.     Gregor Hams, MD 03/06/15 [phone]

## 2015-03-14 ENCOUNTER — Ambulatory Visit: Payer: 59 | Admitting: Internal Medicine

## 2015-04-15 ENCOUNTER — Inpatient Hospital Stay: Admission: RE | Admit: 2015-04-15 | Payer: 59 | Source: Ambulatory Visit

## 2015-04-16 ENCOUNTER — Ambulatory Visit: Payer: 59 | Admitting: Internal Medicine

## 2015-04-18 ENCOUNTER — Inpatient Hospital Stay: Admission: RE | Admit: 2015-04-18 | Payer: 59 | Source: Ambulatory Visit

## 2015-04-26 ENCOUNTER — Other Ambulatory Visit: Payer: Self-pay | Admitting: Internal Medicine

## 2015-04-28 ENCOUNTER — Telehealth: Payer: Self-pay | Admitting: Internal Medicine

## 2015-04-28 NOTE — Telephone Encounter (Signed)
[facility] are the ones who do pre certs for imaging studies.

## 2015-04-28 NOTE — Telephone Encounter (Signed)
Pt has 04/29/15 appt f/u Ct. Will refill rescue inhaler.

## 2015-04-29 ENCOUNTER — Ambulatory Visit: Payer: 59 | Admitting: Internal Medicine

## 2015-04-30 NOTE — Telephone Encounter (Signed)
Called [facility] no one knows who Delice Bisonara is -so i was given a new fax number to refax for an appeal records were already faxed before,refaxed for appeal Tobe SosSally E Ottinger

## 2015-05-08 ENCOUNTER — Emergency Department (INDEPENDENT_AMBULATORY_CARE_PROVIDER_SITE_OTHER)
Admission: EM | Admit: 2015-05-08 | Discharge: 2015-05-08 | Disposition: A | Discharge status: Home / Self Care | Payer: 59 | Source: Home / Self Care | Attending: Family Medicine | Admitting: Family Medicine

## 2015-05-08 ENCOUNTER — Encounter (HOSPITAL_COMMUNITY): Payer: Self-pay | Admitting: Emergency Medicine

## 2015-05-08 DIAGNOSIS — S39012A Strain of muscle, fascia and tendon of lower back, initial encounter: Secondary | ICD-10-CM

## 2015-05-08 DIAGNOSIS — IMO0001 Reserved for inherently not codable concepts without codable children: Secondary | ICD-10-CM

## 2015-05-08 DIAGNOSIS — J449 Chronic obstructive pulmonary disease, unspecified: Secondary | ICD-10-CM | POA: Diagnosis not present

## 2015-05-08 MED ORDER — METHOCARBAMOL 500 MG PO TABS: 500.0000 mg | ORAL_TABLET | Freq: Four times a day (QID) | ORAL | Status: DC | PRN

## 2015-05-08 MED ORDER — DICLOFENAC SODIUM 75 MG PO TBEC: 75.0000 mg | DELAYED_RELEASE_TABLET | Freq: Two times a day (BID) | ORAL | Status: DC

## 2015-05-08 NOTE — ED Notes (Addendum)
Reports having back pain.  No known injury.  2 days ago felt like back getting sore, and now pain is increasing and goes down legs, bilaterally.  Back pain is "like a toothache" with sharp, intermittent pain

## 2015-05-08 NOTE — Discharge Instructions (Signed)
Please perform basic back stretching exercises. Please apply heat and gently massage the area. Please use the muscle relaxers in the Voltaren as prescribed. This should improve over the next several days. Please go to the emergency room where primary care doctor if you get worse.

## 2015-05-08 NOTE — ED Provider Notes (Signed)
CSN: [id number]     Arrival date & time 05/08/15  3710 History   First MD Initiated Contact with Patient 05/08/15 1016     Chief Complaint  Patient presents with  . Back Pain   (Consider location/radiation/quality/duration/timing/severity/associated sxs/prior Treatment) HPI   Slow onset Lower back- lateral No trauma 2 days Dull ache constant Sharp w/ certain movements Changing how she walks Heat and cold, tylenol without much improvement Basic stretching.  Getting worse.  Denies chest pain, shortness breath broad-based line. On home O2. Denies palpitations, fevers, nausea, vomiting, chest pain, loss of bowel or bladder function, falls, radiation of pain down legs.  Past Medical History  Diagnosis Date  . COPD (chronic obstructive pulmonary disease)     PFTS 03/08/12: fev1 0.58L/1%, FVC 1.18/33%, Ratop 49 and c/w ssevere obstruction. 21% BD response on FVC, RV 219%, DLCO 11/54%  . History of alcoholism   . Irregular menses   . DVT of upper extremity (deep vein thrombosis) April 2013    right subclavian // Unclear precipitating cause - possibly significant right heart failure, with vascular stasis potentially predisposing to clotting.    . Pulmonary embolus April 2013    Precipitating cause unclear. Was treated with coumadin from April-June 2013.  . Moderate to severe pulmonary hypertension April 2013    Cardiac cath on 03/06/12 - 1. Elevated pulmonary artery pressures, right sided filling pressures.,  2. PA: 64/45 (mean 53)    . Hepatic cirrhosis     Questionable history of - Noted on CT abdomen (03/2012) - thought to be due to vascular congestion from right heart failure +/- patient's history of alcohol abuse  . Axillary adenopathy     right axillary adenopathy noted on CT chest (03/06/2012)  . Periodontal disease   . Chronic right-sided CHF (congestive heart failure) 03/23/2012    with cor pulmonale. Last RHC 03/2012  . Pneumonia ~ 1985; 01/2011  . History of chronic bronchitis    . Exertional shortness of breath   . On home oxygen therapy     "2-3 L 24/7" (05/03/2013)  . OSA (obstructive sleep apnea)     wears noctural BiPAP (05/03/2013)  . Migraine     "~ 1/yr" (05/03/2013)   Past Surgical History  Procedure Laterality Date  . Cardiac surgery  February 20, 1977    "my heart was backwards" (05/03/2013)  . Lung surgery  11-28-77  . Appendectomy  05/03/2013  . Cardiac catheterization  03/2012  . Laparoscopic appendectomy N/A 05/03/2013    Procedure: APPENDECTOMY LAPAROSCOPIC;  Surgeon: Gwenyth Ober, MD;  Location: [place];  Service: General;  Laterality: N/A;  . Right heart catheterization N/A 03/07/2012    Procedure: RIGHT HEART CATH;  Surgeon: Burnell Blanks, MD;  Location: [facility] CATH LAB;  Service: Cardiovascular;  Laterality: N/A;   Family History  Problem Relation Age of Onset  . Hypertension Father   . Heart disease Father     CHF; died age 37   . Alcohol abuse Father   . Other Brother     died age 27 y.o overdose   History  Substance Use Topics  . Smoking status: Former Smoker -- 1.00 packs/day for 25 years    Types: Cigarettes    Quit date: 02/18/2012  . Smokeless tobacco: Never Used  . Alcohol Use: No     Comment: previously drinking 1 pint 3-4 days a week for 2002-2013, but in 2014, no longer drinking   OB History    No data available  Review of Systems Per HPI with all other pertinent systems negative.   Allergies  Review of patient's allergies indicates no known allergies.  Home Medications   Prior to Admission medications   Medication Sig Start Date End Date Taking? Authorizing Provider  albuterol (PROVENTIL HFA;VENTOLIN HFA) 108 (90 BASE) MCG/ACT inhaler Inhale 2 puffs into the lungs every 6 (six) hours as needed for wheezing or shortness of breath. 09/06/14   Murali Ramaswamy, MD  diclofenac (VOLTAREN) 75 MG EC tablet Take 1 tablet (75 mg total) by mouth 2 (two) times daily. 05/08/15   David J Merrell, MD  Fluticasone-Salmeterol  (ADVAIR) 250-50 MCG/DOSE AEPB Inhale 1 puff into the lungs every 12 (twelve) hours. 09/27/14   Murali Ramaswamy, MD  methocarbamol (ROBAXIN) 500 MG tablet Take 1-2 tablets (500-1,000 mg total) by mouth every 6 (six) hours as needed for muscle spasms. 05/08/15   David J Merrell, MD  metoCLOPramide (REGLAN) 5 MG tablet Take 1 tablet (5 mg total) by mouth 4 (four) times daily. 12/18/14   Carlos Madera, MD  omeprazole (PRILOSEC) 20 MG capsule Take 20 mg by mouth daily as needed.    Historical Provider, MD  ondansetron (ZOFRAN-ODT) 8 MG disintegrating tablet Take 0.5 tablets (4 mg total) by mouth every 8 (eight) hours as needed for nausea. 12/18/14   Carlos Madera, MD  oxyCODONE-acetaminophen (PERCOCET/ROXICET) 5-325 MG per tablet Take 2 tablets by mouth every 4 (four) hours as needed for severe pain. Patient not taking: Reported on 05/08/2015 03/06/15   Evan S Corey, MD  polyethylene glycol (MIRALAX / GLYCOLAX) packet Take 17 g by mouth 2 (two) times daily. 12/18/14   Carlos Madera, MD  PredniSONE 10 MG KIT 12 day dose pack po Patient not taking: Reported on 05/08/2015 03/06/15   Evan S Corey, MD  tiotropium (SPIRIVA) 18 MCG inhalation capsule Place 1 capsule (18 mcg total) into inhaler and inhale daily. 09/27/14   Murali Ramaswamy, MD  torsemide (DEMADEX) 20 MG tablet Take 1 tablet (20 mg total) by mouth daily. 09/10/14   Brian S Crenshaw, MD  VENTOLIN HFA 108 (90 BASE) MCG/ACT inhaler USE 2 PUFFS EVERY 6 HOURS AS NEEDED FOR WHEEZING 04/28/15   Murali Ramaswamy, MD   BP 98/62 mmHg  Pulse 87  Temp(Src) 97.6 F (36.4 C) (Oral)  Resp 16  SpO2 92%  LMP 04/24/2015 Physical Exam Physical Exam  Constitutional: oriented to person, place, and time. appears well-developed and well-nourished. No distress.  HENT:  Head: Normocephalic and atraumatic.  Eyes: EOMI. PERRL.  Neck: Normal range of motion.  Cardiovascular: RRR, no m/r/g, 2+ distal pulses,  Pulmonary/Chest: Effort normal and breath sounds normal. No  respiratory distress.  2 L nasal cannula Abdominal: Soft. Bowel sounds are normal. NonTTP, no distension.  Musculoskeletal: Lumbar paraspinal muscle tenderness to palpation..  Neurological: alert and oriented to person, place, and time.  Skin: Skin is warm. No rash noted. non diaphoretic.  Psychiatric: normal mood and affect. behavior is normal. Judgment and thought content normal.   ED Course  Procedures (including critical care time) Labs Review Labs Reviewed - No data to display  Imaging Review No results found.   MDM   1. Back strain, initial encounter   2. COPD bronchitis    No evidence of acute COPD exacerbation. Start Robaxin, Voltaren, stretches, heat, massage.    David J Merrell, MD 05/08/15 1056 

## 2015-05-27 ENCOUNTER — Ambulatory Visit: Payer: 59 | Admitting: Internal Medicine

## 2015-06-05 ENCOUNTER — Other Ambulatory Visit: Payer: Self-pay | Admitting: Internal Medicine

## 2015-07-07 ENCOUNTER — Emergency Department (INDEPENDENT_AMBULATORY_CARE_PROVIDER_SITE_OTHER)
Admission: EM | Admit: 2015-07-07 | Discharge: 2015-07-07 | Disposition: A | Discharge status: Home / Self Care | Payer: 59 | Source: Home / Self Care | Attending: Internal Medicine | Admitting: Internal Medicine

## 2015-07-07 ENCOUNTER — Encounter (HOSPITAL_COMMUNITY): Payer: Self-pay | Admitting: Emergency Medicine

## 2015-07-07 ENCOUNTER — Emergency Department (INDEPENDENT_AMBULATORY_CARE_PROVIDER_SITE_OTHER): Payer: 59

## 2015-07-07 DIAGNOSIS — T148 Other injury of unspecified body region: Secondary | ICD-10-CM

## 2015-07-07 DIAGNOSIS — T148XXA Other injury of unspecified body region, initial encounter: Secondary | ICD-10-CM

## 2015-07-07 NOTE — Discharge Instructions (Signed)
Contusion °A contusion is a deep bruise. Contusions happen when an injury causes bleeding under the skin. Signs of bruising include pain, puffiness (swelling), and discolored skin. The contusion may turn blue, purple, or yellow. °HOME CARE  °· Put ice on the injured area. °¨ Put ice in a plastic bag. °¨ Place a towel between your skin and the bag. °¨ Leave the ice on for 15-20 minutes, 03-04 times a day. °· Only take medicine as told by your doctor. °· Rest the injured area. °· If possible, raise (elevate) the injured area to lessen puffiness. °GET HELP RIGHT AWAY IF:  °· You have more bruising or puffiness. °· You have pain that is getting worse. °· Your puffiness or pain is not helped by medicine. °MAKE SURE YOU:  °· Understand these instructions. °· Will watch your condition. °· Will get help right away if you are not doing well or get worse. °Document Released: 05/10/2008 Document Revised: 02/14/2012 Document Reviewed: 09/27/2011 °ExitCare® Patient Information ©2015 ExitCare, LLC. This information is not intended to replace advice given to you by your health care provider. Make sure you discuss any questions you have with your health care provider. ° °

## 2015-07-07 NOTE — ED Notes (Signed)
Pt hit her left forearm against her porch post yesterday trying to catch her dog.  Today the arm is swollen and bruised.  She also complains of tendernessin her hand where she tried to grab the dog's collar.  She states she cannot lift anything of any weight due to the pain in her arm and fingers.

## 2015-07-07 NOTE — ED Provider Notes (Signed)
CSN: [id number]     Arrival date & time 07/07/15  1607 History   First MD Initiated Contact with Patient 07/07/15 1825     Chief Complaint  Patient presents with  . Arm Injury   (Consider location/radiation/quality/duration/timing/severity/associated sxs/prior Treatment) HPI Comments: No fall, head injury or loss of consciousness  Patient is a 39 y.o. female presenting with arm injury. The history is provided by the patient.  Arm Injury Location:  Arm Time since incident:  1 day Injury: yes   Mechanism of injury comment:  The patient was trying to release her dog and the dog ran away and her aunt recoiled, hitting a support beam of her porch Arm location:  L arm Pain details:    Quality:  Aching   Radiates to:  L fingers (Tingling between second and third digits)   Severity:  Moderate   Onset quality:  Unable to specify   Duration:  1 day   Timing:  Constant   Progression:  Worsening Chronicity:  New Dislocation: no   Foreign body present:  No foreign bodies Tetanus status:  Unknown Prior injury to area:  No Relieved by:  Nothing Worsened by:  Bearing weight Ineffective treatments:  Ice Associated symptoms: decreased range of motion (Limited by pain)   Risk factors: no concern for non-accidental trauma, no known bone disorder and no frequent fractures     Past Medical History  Diagnosis Date  . COPD (chronic obstructive pulmonary disease)     PFTS 03/08/12: fev1 0.58L/1%, FVC 1.18/33%, Ratop 49 and c/w ssevere obstruction. 21% BD response on FVC, RV 219%, DLCO 11/54%  . History of alcoholism   . Irregular menses   . DVT of upper extremity (deep vein thrombosis) April 2013    right subclavian // Unclear precipitating cause - possibly significant right heart failure, with vascular stasis potentially predisposing to clotting.    . Pulmonary embolus April 2013    Precipitating cause unclear. Was treated with coumadin from April-June 2013.  . Moderate to severe pulmonary  hypertension April 2013    Cardiac cath on 03/06/12 - 1. Elevated pulmonary artery pressures, right sided filling pressures.,  2. PA: 64/45 (mean 53)    . Hepatic cirrhosis     Questionable history of - Noted on CT abdomen (03/2012) - thought to be due to vascular congestion from right heart failure +/- patient's history of alcohol abuse  . Axillary adenopathy     right axillary adenopathy noted on CT chest (03/06/2012)  . Periodontal disease   . Chronic right-sided CHF (congestive heart failure) 03/23/2012    with cor pulmonale. Last RHC 03/2012  . Pneumonia ~ 1985; 01/2011  . History of chronic bronchitis   . Exertional shortness of breath   . On home oxygen therapy     "2-3 L 24/7" (05/03/2013)  . OSA (obstructive sleep apnea)     wears noctural BiPAP (05/03/2013)  . Migraine     "~ 1/yr" (05/03/2013)   Past Surgical History  Procedure Laterality Date  . Cardiac surgery  11/01/1977    "my heart was backwards" (05/03/2013)  . Lung surgery  1977-02-23  . Appendectomy  05/03/2013  . Cardiac catheterization  03/2012  . Laparoscopic appendectomy N/A 05/03/2013    Procedure: APPENDECTOMY LAPAROSCOPIC;  Surgeon: Gwenyth Ober, MD;  Location: [place];  Service: General;  Laterality: N/A;  . Right heart catheterization N/A 03/07/2012    Procedure: RIGHT HEART CATH;  Surgeon: Burnell Blanks, MD;  Location: [facility]  CATH LAB;  Service: Cardiovascular;  Laterality: N/A;   Family History  Problem Relation Age of Onset  . Hypertension Father   . Heart disease Father     CHF; died age 33   . Alcohol abuse Father   . Other Brother     died age 70 y.o overdose   History  Substance Use Topics  . Smoking status: Former Smoker -- 1.00 packs/day for 25 years    Types: Cigarettes    Quit date: 02/18/2012  . Smokeless tobacco: Never Used  . Alcohol Use: No     Comment: previously drinking 1 pint 3-4 days a week for 2002-2013, but in 2014, no longer drinking   OB History    No data available      Review of Systems  Musculoskeletal: Negative for joint swelling.  Skin: Positive for color change.    Allergies  Review of patient's allergies indicates no known allergies.  Home Medications   Prior to Admission medications   Medication Sig Start Date End Date Taking? Authorizing Provider  albuterol (PROVENTIL HFA;VENTOLIN HFA) 108 (90 BASE) MCG/ACT inhaler Inhale 2 puffs into the lungs every 6 (six) hours as needed for wheezing or shortness of breath. 09/06/14  Yes Brand Males, MD  Fluticasone-Salmeterol (ADVAIR) 250-50 MCG/DOSE AEPB Inhale 1 puff into the lungs every 12 (twelve) hours. 09/27/14  Yes Brand Males, MD  tiotropium (SPIRIVA) 18 MCG inhalation capsule Place 1 capsule (18 mcg total) into inhaler and inhale daily. 09/27/14  Yes Brand Males, MD  torsemide (DEMADEX) 20 MG tablet Take 1 tablet (20 mg total) by mouth daily. 09/10/14  Yes Lelon Perla, MD  diclofenac (VOLTAREN) 75 MG EC tablet Take 1 tablet (75 mg total) by mouth 2 (two) times daily. 05/08/15   Waldemar Dickens, MD  methocarbamol (ROBAXIN) 500 MG tablet Take 1-2 tablets (500-1,000 mg total) by mouth every 6 (six) hours as needed for muscle spasms. 05/08/15   Waldemar Dickens, MD  metoCLOPramide (REGLAN) 5 MG tablet Take 1 tablet (5 mg total) by mouth 4 (four) times daily. 12/18/14   Barton Dubois, MD  omeprazole (PRILOSEC) 20 MG capsule Take 20 mg by mouth daily as needed.    Historical Provider, MD  ondansetron (ZOFRAN-ODT) 8 MG disintegrating tablet Take 0.5 tablets (4 mg total) by mouth every 8 (eight) hours as needed for nausea. 12/18/14   Barton Dubois, MD  oxyCODONE-acetaminophen (PERCOCET/ROXICET) 5-325 MG per tablet Take 2 tablets by mouth every 4 (four) hours as needed for severe pain. Patient not taking: Reported on 05/08/2015 03/06/15   Gregor Hams, MD  polyethylene glycol [facility] Floria Raveling) packet Take 17 g by mouth 2 (two) times daily. 12/18/14   Barton Dubois, MD  PredniSONE 10 MG KIT 12 day  dose pack po Patient not taking: Reported on 05/08/2015 03/06/15   Gregor Hams, MD  VENTOLIN HFA 108 (90 BASE) MCG/ACT inhaler USE 2 PUFFS EVERY 6 HOURS AS NEEDED FOR WHEEZING 06/06/15   Brand Males, MD   BP 120/83 mmHg  Pulse 81  Temp(Src) 97.6 F (36.4 C) (Oral)  Resp 16  SpO2 95%  LMP 06/30/2015 (Approximate) Physical Exam  Constitutional: She is oriented to person, place, and time.  Eyes:  Esotropia of left eye  Cardiovascular: Normal rate, regular rhythm and normal heart sounds.  Exam reveals no gallop and no friction rub.   No murmur heard. Pulmonary/Chest: Breath sounds normal. No respiratory distress.  Patient is wearing oxygen by nasal cannula  Musculoskeletal: She exhibits tenderness.  Dressing present over brachial radialis of left arm; range of motion limited by pain to three-quarter supination. Grip strength is normal bilaterally  Neurological: She is alert and oriented to person, place, and time. A cranial nerve deficit (LR 6 palsy left side) is present.  Skin:  Fresh bruising of left forearm overhead of brachial radialis muscle    ED Course  Procedures (including critical care time) Labs Review Labs Reviewed - No data to display  Imaging Review No results found.   MDM  No diagnosis found. X-ray negative for fracture. Advised patient to continue icing area for more than 20 minutes at a time to relieve swelling and pain.    Harrie Foreman, MD 07/07/15 2131

## 2015-07-25 ENCOUNTER — Inpatient Hospital Stay (HOSPITAL_COMMUNITY)
Admission: AD | Admit: 2015-07-25 | Discharge: 2015-07-25 | Disposition: A | Discharge status: Home / Self Care | Payer: 59 | Source: Ambulatory Visit | Attending: Family Medicine | Admitting: Family Medicine

## 2015-07-25 ENCOUNTER — Encounter (HOSPITAL_COMMUNITY): Payer: Self-pay | Admitting: *Deleted

## 2015-07-25 DIAGNOSIS — Z86718 Personal history of other venous thrombosis and embolism: Secondary | ICD-10-CM | POA: Diagnosis not present

## 2015-07-25 DIAGNOSIS — Z86711 Personal history of pulmonary embolism: Secondary | ICD-10-CM | POA: Diagnosis not present

## 2015-07-25 DIAGNOSIS — R1032 Left lower quadrant pain: Secondary | ICD-10-CM

## 2015-07-25 DIAGNOSIS — Z87891 Personal history of nicotine dependence: Secondary | ICD-10-CM | POA: Diagnosis not present

## 2015-07-25 DIAGNOSIS — N939 Abnormal uterine and vaginal bleeding, unspecified: Secondary | ICD-10-CM | POA: Diagnosis present

## 2015-07-25 DIAGNOSIS — Z3202 Encounter for pregnancy test, result negative: Secondary | ICD-10-CM | POA: Insufficient documentation

## 2015-07-25 DIAGNOSIS — R1031 Right lower quadrant pain: Secondary | ICD-10-CM

## 2015-07-25 LAB — URINALYSIS, ROUTINE W REFLEX MICROSCOPIC
BILIRUBIN URINE: NEGATIVE
Glucose, UA: NEGATIVE mg/dL
Ketones, ur: NEGATIVE mg/dL
Leukocytes, UA: NEGATIVE
NITRITE: NEGATIVE
PROTEIN: NEGATIVE mg/dL
Specific Gravity, Urine: 1.02 (ref 1.005–1.030)
UROBILINOGEN UA: 1 mg/dL (ref 0.0–1.0)
pH: 7 (ref 5.0–8.0)

## 2015-07-25 LAB — URINE MICROSCOPIC-ADD ON

## 2015-07-25 MED ORDER — MEDROXYPROGESTERONE ACETATE 5 MG PO TABS: 5.0000 mg | ORAL_TABLET | Freq: Every day | ORAL | Status: DC

## 2015-07-25 MED ORDER — IBUPROFEN 600 MG PO TABS: 600.0000 mg | ORAL_TABLET | Freq: Four times a day (QID) | ORAL | Status: DC | PRN

## 2015-07-25 MED ORDER — KETOROLAC TROMETHAMINE 60 MG/2ML IM SOLN
60.0000 mg | Freq: Once | INTRAMUSCULAR | Status: AC
  Administered 2015-07-25: 60 mg via INTRAMUSCULAR
  Filled 2015-07-25: qty 2

## 2015-07-25 NOTE — MAU Note (Signed)
Pt states here for vaginal bleeding. Had menstrual cycle, then had another 2 weeks later. Cycles usually last 3-4 days. This cycle started 2 days ago. Awoke in pain this am with ovary pain, and leg pain. Has changed tampon 3 times? Today.

## 2015-07-25 NOTE — MAU Provider Note (Signed)
History     CSN: [id number]  Arrival date and time: 07/25/15 1352   First Provider Initiated Contact with Patient 07/25/15 1444      Chief Complaint  Patient presents with  . Vaginal Bleeding   Vaginal Bleeding Associated symptoms include nausea. Pertinent negatives include no chills, fever or vomiting.     Mary [name] is a 39 y/o female G0 with PMH inclduing COPD, DVT, PE and hx of prior irregular menstrual bleeding who presents with complaint of lower abdominal cramping and abnormal bleeding.  States that she has had "three periods in the last six weeks," with an episode of bleeding two weeks ago that occurred at an abnormal time but was typical of her 3-day menstrual cycle.  On Wednesday (8/17), she began to have another episode of bleeding, and became concerned when she developed unusually intense "cramping" abdominal pain yesterday.  Early this AM, cramping awoke her from sleep and has not been relieved with use of Midol.  Nothing seems to make pain better or worse.  States that it "comes and goes," lasting about 1 minute at a time, occuring about 4 times an hour.  Pain is located in her lower abdomen.  Denies any pain with urination, increased urinary frequency or strange odor.  Has felt "hot" and nauseated with one episode of vomiting, but did not take temperature.  No other symptoms at this time.  Patient is sexually active with her husband, does not use birth control or condoms.  No hx of prior STDs.  Hx of prior alcoholism but denies any recent ETOH use.  With regards to her menstrual history, she states that for the past 2-3 years her periods have been "regular," lasting 3 days and occuring every month.  Prior to 3 years ago, she would occasionally miss menstrual cycles, but had never had "two periods in one month before."  The bleeding and pain concerned her enough to come in for evaluation given her other medical problems and baseline level of health.    She is an active  patient of the clinic and was seen in the last year. She was supposed to follow up with the clinic to have an IUD placed and was told that it would be detrimental if she became pregnant. The patient has not scheduled this appointment.    OB History    No data available      Past Medical History  Diagnosis Date  . COPD (chronic obstructive pulmonary disease)     PFTS 03/08/12: fev1 0.58L/1%, FVC 1.18/33%, Ratop 49 and c/w ssevere obstruction. 21% BD response on FVC, RV 219%, DLCO 11/54%  . History of alcoholism   . Irregular menses   . DVT of upper extremity (deep vein thrombosis) April 2013    right subclavian // Unclear precipitating cause - possibly significant right heart failure, with vascular stasis potentially predisposing to clotting.    . Pulmonary embolus April 2013    Precipitating cause unclear. Was treated with coumadin from April-June 2013.  . Moderate to severe pulmonary hypertension April 2013    Cardiac cath on 03/06/12 - 1. Elevated pulmonary artery pressures, right sided filling pressures.,  2. PA: 64/45 (mean 53)    . Hepatic cirrhosis     Questionable history of - Noted on CT abdomen (03/2012) - thought to be due to vascular congestion from right heart failure +/- patient's history of alcohol abuse  . Axillary adenopathy     right axillary adenopathy noted on CT chest (  03/06/2012)  . Periodontal disease   . Chronic right-sided CHF (congestive heart failure) 03/23/2012    with cor pulmonale. Last RHC 03/2012  . Pneumonia ~ 1985; 01/2011  . History of chronic bronchitis   . Exertional shortness of breath   . On home oxygen therapy     "2-3 L 24/7" (05/03/2013)  . OSA (obstructive sleep apnea)     wears noctural BiPAP (05/03/2013)  . Migraine     "~ 1/yr" (05/03/2013)    Past Surgical History  Procedure Laterality Date  . Cardiac surgery  12/19/76    "my heart was backwards" (05/03/2013)  . Lung surgery  December 06, 1977  . Appendectomy  05/03/2013  . Cardiac  catheterization  03/2012  . Laparoscopic appendectomy N/A 05/03/2013    Procedure: APPENDECTOMY LAPAROSCOPIC;  Surgeon: Gwenyth Ober, MD;  Location: [place];  Service: General;  Laterality: N/A;  . Right heart catheterization N/A 03/07/2012    Procedure: RIGHT HEART CATH;  Surgeon: Burnell Blanks, MD;  Location: [facility] CATH LAB;  Service: Cardiovascular;  Laterality: N/A;    Family History  Problem Relation Age of Onset  . Hypertension Father   . Heart disease Father     CHF; died age 5   . Alcohol abuse Father   . Other Brother     died age 73 y.o overdose    Social History  Substance Use Topics  . Smoking status: Former Smoker -- 1.00 packs/day for 25 years    Types: Cigarettes    Quit date: 02/18/2012  . Smokeless tobacco: Never Used  . Alcohol Use: No     Comment: previously drinking 1 pint 3-4 days a week for 2002-2013, but in 2014, no longer drinking  Denies ETOH use, no tobacco use, no drug use  Allergies: No Known Allergies  Prescriptions prior to admission  Medication Sig Dispense Refill Last Dose  . albuterol (PROVENTIL HFA;VENTOLIN HFA) 108 (90 BASE) MCG/ACT inhaler Inhale 2 puffs into the lungs every 6 (six) hours as needed for wheezing or shortness of breath. 8.5 g 1 07/25/2015 at Unknown time  . Fluticasone-Salmeterol (ADVAIR) 250-50 MCG/DOSE AEPB Inhale 1 puff into the lungs every 12 (twelve) hours. 60 each 6 07/24/2015 at Unknown time  . ibuprofen (ADVIL,MOTRIN) 200 MG tablet Take 200 mg by mouth every 6 (six) hours as needed for fever or moderate pain.   prn  . tiotropium (SPIRIVA) 18 MCG inhalation capsule Place 1 capsule (18 mcg total) into inhaler and inhale daily. 30 capsule 5 07/24/2015 at Unknown time  . diclofenac (VOLTAREN) 75 MG EC tablet Take 1 tablet (75 mg total) by mouth 2 (two) times daily. (Patient not taking: Reported on 07/25/2015) 60 tablet 0   . methocarbamol (ROBAXIN) 500 MG tablet Take 1-2 tablets (500-1,000 mg total) by mouth every 6 (six) hours  as needed for muscle spasms. (Patient not taking: Reported on 07/25/2015) 60 tablet 0   . metoCLOPramide (REGLAN) 5 MG tablet Take 1 tablet (5 mg total) by mouth 4 (four) times daily. (Patient not taking: Reported on 07/25/2015) 120 tablet 0 03/06/2015 at Unknown time  . ondansetron (ZOFRAN-ODT) 8 MG disintegrating tablet Take 0.5 tablets (4 mg total) by mouth every 8 (eight) hours as needed for nausea. (Patient not taking: Reported on 07/25/2015) 20 tablet 0 Taking  . oxyCODONE-acetaminophen (PERCOCET/ROXICET) 5-325 MG per tablet Take 2 tablets by mouth every 4 (four) hours as needed for severe pain. (Patient not taking: Reported on 05/08/2015) 15 tablet 0   .  polyethylene glycol (MIRALAX / GLYCOLAX) packet Take 17 g by mouth 2 (two) times daily. (Patient not taking: Reported on 07/25/2015) 28 each 1 Taking  . PredniSONE 10 MG KIT 12 day dose pack po (Patient not taking: Reported on 05/08/2015) 1 kit 0   . torsemide (DEMADEX) 20 MG tablet Take 1 tablet (20 mg total) by mouth daily. (Patient not taking: Reported on 07/25/2015) 90 tablet 3 07/07/2015 at Unknown time  . VENTOLIN HFA 108 (90 BASE) MCG/ACT inhaler USE 2 PUFFS EVERY 6 HOURS AS NEEDED FOR WHEEZING (Patient not taking: Reported on 07/25/2015) 18 Inhaler 5    Results for orders placed or performed during the hospital encounter of 07/25/15 (from the past 48 hour(s))  Urinalysis, Routine w reflex microscopic (not at [facility])     Status: Abnormal   Collection Time: 07/25/15  2:00 PM  Result Value Ref Range   Color, Urine YELLOW YELLOW   APPearance HAZY (A) CLEAR   Specific Gravity, Urine 1.020 1.005 - 1.030   pH 7.0 5.0 - 8.0   Glucose, UA NEGATIVE NEGATIVE mg/dL   Hgb urine dipstick LARGE (A) NEGATIVE   Bilirubin Urine NEGATIVE NEGATIVE   Ketones, ur NEGATIVE NEGATIVE mg/dL   Protein, ur NEGATIVE NEGATIVE mg/dL   Urobilinogen, UA 1.0 0.0 - 1.0 mg/dL   Nitrite NEGATIVE NEGATIVE   Leukocytes, UA NEGATIVE NEGATIVE  Urine microscopic-add on     Status:  Abnormal   Collection Time: 07/25/15  2:00 PM  Result Value Ref Range   Squamous Epithelial / LPF FEW (A) RARE   RBC / HPF 3-6 <3 RBC/hpf   Bacteria, UA RARE RARE    Review of Systems  Constitutional: Positive for malaise/fatigue. Negative for fever and chills.  Gastrointestinal: Positive for nausea. Negative for vomiting.  Genitourinary: Positive for vaginal bleeding.  Neurological: Negative for dizziness and weakness.   Physical Exam   Blood pressure 120/66, pulse 88, temperature 98.5 F (36.9 C), temperature source Oral, resp. rate 18, height 5' 2.5" (1.588 m), weight 87.998 kg (194 lb), last menstrual period 06/30/2015, SpO2 96 %.  Physical Exam  General: Chronically ill-appearing obese female with nasal canula in place, sitting in bed in NAD Heart: RRR Pulm: No audible wheezes, distant breath sounds, normal work of breathing on 2L O2 Abd: Obese abdomen, mild TTP on left and right lower quadrants, negative rebound or guarding.  GU: Small amount of bleeding from cervical os, did not soak one fox swab.  No cervical motion tenderness or adnexal tenderness Extremities: No pitting edema   MAU Course  Procedures  MDM -Pregnancy test negative -Speculum exam showed small amount of blood  - Toradol 60 mg IM   - patient denies pain at the time of discharge.   Assessment and Plan  Mary [name] is a 39 y/o F with PMH as above, here today for evaluation of vaginal bleeding.  At this time, do not suspect life-threatening process such as ectopic pregnancy or acute abdominal process such as ovarian torsion or appendicitis.  Will treat patient with IM toradol for pain control and prescribe 10 days of provera to control bleeding.  Encouraged follow-up in [place] clinic to discuss birth control options and further evaluation of bleeding.  Patient is to return to MAU if symptoms worsen.   RX: Provera, ibuprofen    Lezlie Lye, NP  07/25/2015 3:57 PM

## 2015-08-20 ENCOUNTER — Telehealth: Payer: Self-pay | Admitting: Adult Health

## 2015-08-20 NOTE — Telephone Encounter (Signed)
Pt did not go for CT chest  i saw there was an insurance issue , can we please call pt and reschedule this  Highly recommended.  Will need ov with Dr. Marchelle Gearing right after to review results

## 2015-08-20 NOTE — Telephone Encounter (Signed)
Called pt but was unable to LVM due to no VM setup

## 2015-08-21 NOTE — Telephone Encounter (Signed)
Attempted to call pt Unable to LVM dut to no VM setup

## 2015-08-21 NOTE — Telephone Encounter (Signed)
Pt returned call. Pt stated when she called the insurance she was informed that a new case will need to be opened for the CT chest. The CT chest order is from 2.26.16.   PCC's please advise.

## 2015-08-22 NOTE — Telephone Encounter (Signed)
i have spoken to this pt and i will try [employer] to precert this scan and then we will schedule it Tobe Sos

## 2015-08-22 NOTE — Telephone Encounter (Signed)
Received auth# from [facility] [id number] and pt scheduled for chest ct@[facility] 08/26/15@11am  and she is aware of this Tobe Sos

## 2015-08-26 ENCOUNTER — Inpatient Hospital Stay: Admission: RE | Admit: 2015-08-26 | Payer: 59 | Source: Ambulatory Visit

## 2015-08-27 ENCOUNTER — Ambulatory Visit (INDEPENDENT_AMBULATORY_CARE_PROVIDER_SITE_OTHER)
Admission: RE | Admit: 2015-08-27 | Discharge: 2015-08-27 | Disposition: A | Discharge status: Home / Self Care | Payer: 59 | Source: Ambulatory Visit | Attending: Adult Health | Admitting: Adult Health

## 2015-08-27 DIAGNOSIS — R911 Solitary pulmonary nodule: Secondary | ICD-10-CM

## 2015-09-01 NOTE — Progress Notes (Signed)
Quick Note:  ATC pt x1 on only contact number in system - received automated message stating "This person has a voicemail that has not been set up yet. Goodbye." WCB. ______

## 2015-09-01 NOTE — Progress Notes (Signed)
Quick Note:  Pt returned call. Informed her of the results and recs per TP. Appt made with MR on 9.28.16. Pt verbalized understanding and denied any further questions or concerns at this time.   ______

## 2015-09-03 ENCOUNTER — Ambulatory Visit: Payer: 59 | Admitting: Internal Medicine

## 2015-09-16 ENCOUNTER — Ambulatory Visit (INDEPENDENT_AMBULATORY_CARE_PROVIDER_SITE_OTHER): Payer: 59 | Admitting: Internal Medicine

## 2015-09-16 ENCOUNTER — Encounter: Payer: Self-pay | Admitting: Internal Medicine

## 2015-09-16 VITALS — BP 122/80 | HR 93 | Ht 62.5 in | Wt 201.4 lb

## 2015-09-16 DIAGNOSIS — J9612 Chronic respiratory failure with hypercapnia: Secondary | ICD-10-CM

## 2015-09-16 DIAGNOSIS — J438 Other emphysema: Secondary | ICD-10-CM | POA: Diagnosis not present

## 2015-09-16 DIAGNOSIS — R911 Solitary pulmonary nodule: Secondary | ICD-10-CM

## 2015-09-16 DIAGNOSIS — Z23 Encounter for immunization: Secondary | ICD-10-CM

## 2015-09-16 NOTE — Addendum Note (Signed)
Addended by: Angelene Giovanni on: 09/16/2015 03:58 PM   Modules accepted: Orders

## 2015-09-16 NOTE — Progress Notes (Signed)
Subjective:     Patient ID: Mary [name], female   DOB: Nov 02, 1976, 39 y.o.   MRN: [id number]  HPI  #Obesity  - Body mass index is 32.07 kg/(m^2). on Nov 2013 - Body mass index is 32.95 kg/(m^2). on 06/04/2013    #Ex-Smoker  - age 4 to 8; 25 pack year smoking hx  # Hx of Congenital Heart Surgery NOS   - parents deceased. No idea what surgery she had  #COPD - Alpha 1 is MM - checked May 2013. per hx her dad had copd.  - PFTS 03/08/12: fev1 0.58L/1%, FVC 1.18/33%, Ratop 49 and c/w ssevere obstruction. 21% BD response on FVC, RV 219%, DLCO 11/54% - PFT 04/10/12: fev1 0.54L/18%, 31% BD response, RATio 49, TL 88%, RV 204$, DLCO 30%,  - Abg baseline MAy 2013: 7.43/50/81 - Rx ? On nocturnal cpap/bipap  #Pulmonary Hypertension/Cor Pulmonale - -  - CT chest 03/06/12  IMPRESSION:  Thrombosed right subclavian vein.  Marked right chest wall edema involving the right breast and right  axilla. Right axillary adenopathy.  No pulmonary embolus (probable motion artifact involving the right  lower lobe pulmonary vessels).  Mediastinal and right hilar adenopathy.  Scattered pulmonary parenchymal changes without dominant worrisome  mass. Patchy parenchymal changes most notable right upper lobe.  This can be reevaluated follow-up after acute episode has cleared.  Small right-sided pleural effusion. Right heart enlargement. Small  superiorly located pericardial effusion.  Ascites. Slight lobularity liver without other findings of  cirrhosis. Mild third spacing of fluid abdominal wall.  Original Report Authenticated By: Fuller Canada, M.   ECHO 03/06/12:  - Left ventricle: Systolic function was normal. The estimated ejection fraction was in the range of 55% to 60%. - Ventricular septum: Septal motion showed paradox. - Right ventricle: The cavity size was moderately dilated. Systolic function was reduced. - Right atrium: The atrium was moderately dilated   ECHO 5/813 - Normal LV but RV  severely dilated  Right Heart cath 03/07/12  Hemodynamic Findings:  Ao: (cuff) 111/84  LV: not performed  RA: 23  RV: 59/12/25  PA: 64/45 (mean 53)  PCWP: 18  Fick Cardiac Output: 5.0 L/min  Fick Cardiac Index: 2.76 L/min/m2  Peripheral Aortic Saturation: 90%  Pulmonary Artery Saturation: 67%    HIV 03/22/12   - NEGATIVE  AUTOIMMUNE  - alpha 1 03/07/12 - (done during illness) - 342  - ANA, RF, scl 70 - April 2013 - NEGATIVE   VQ scan 04/12/12  - low prob for PE  [place] Abd 04/07/12  - normal with spleen at upper limit of normal at 12cm      # RUE DVT  - Duple UE 03/06/12: acute deep vein thrombosis involving the right upper extremity of the right subclaviian vein. Rx  Coumadin stopped July 2013 by a Dr Michaell Cowing per her hx   #AECOPD  - several admits early 2013 including icu - May 2013  -opd RX - Oct 2013 - opd Rx  # ? Sleep Apnea  - SLEEP STUDY 03/29/12  Dr Maple Hudson   IMPRESSIONS-RECOMMENDATION:  1. Sleep architecture shows predominance of stage II sleep interrupted  mostly by rapid eye movement in a pattern that suggests sedating  medication during the sleep period, although the none is listed.  2. Occasional respiratory events with sleep disturbance, within normal  limits. Apnea-hypopnea index 4.6 per hour (the normal range for  adults is from 0-5 events per hour). Moderate snoring with oxygen  desaturation to a  nadir of 73% and mean oxygen saturation through  the study of 93% wearing supplemental oxygen.  3. The patient wore nasal oxygen at 4 L per minute throughout the  study as ordered. On arrival while upright, saturation was 97%.  During this study, she desaturated to a nadir of 73% with a mean  throughout the study of 93% saturation while wearing oxygen at 4 L  per minute by nasal prongs. During the total recording time,  oxygen saturation was less than 90% for 51.3 minutes and less than  88% for 38.9 minutes while wearing supplemental oxygen.     #Difficult Social  Situation  - Oct 2013:  husband in and out of MD visits and hospitalizations: diagnosed as mass in testicular wall and polyps; all bengin but mass effect. But now back to work and lot better. Financial situation - better. Says she has gas $ to make it to doc appointments. Unemployed after copd diagnosis. Unable to get disability due to husband job (he earns too much for SSI,). She is not able to get SSD due to lack of work credits   OV 09/27/2014  Chief Complaint  Patient presents with  . Follow-up    Pt states her breathing is unchanged. Pt wearing Bipap nightly and denies issues. Pt states she only has SOB with over exertion, pt also c/o mild cough. Pt denies Cp/tightness.    Followup COPD with chronic respiratory failure. Difficult social situation and therefore not a transplant candidate  - I last saw her in June 2014. Since the overall stable but apparently 6 weeks ago had a COPD exacerbation was treated at an urgent care with antibiotics and prednisone. Currently reporting stable COPD health. COPD cat score 19 and reflects moderate copd symptom  but stable COPD. She has not had a flu shot but does agree to have it today. She is on Spiriva and Advair uses it regularly but when she runs out she has trouble replacing it. Currently not on Advair. Discussed copd research trials: she is very interested     01/30/2015 [facility] follow up : Severe COPD , oxygen dependent, diastolic congestive heart failure Patient returns for a post hospital follow-up She was admitted February 8 to February 10 for COPD exacerbation Patient was treated with antibiotics, steroids, nebulized bronchodilators. CT angio chest was negative for PE however showed a 2 cm groundglass opacity in the right lung apex  This has increased in size. A mildly prominent right hilar node . Patient underwent a PET scan on February 15 that was negative/not hypermetabolic. We discussed her test results today.  Since discharge she  is some better but continues to be weak and have shortness of breath with activity. She is somewhat tearful during her exam today. She remains on Advair and Spiriva.   OV 09/16/2015  Chief Complaint  Patient presents with  . Follow-up    Pt here after CT chest. Pt states her SOB is at baseline. Pt c/o mild prod cough with clear and white mucus. Pt states she had sharp pain under left scapula that would radiate to left shoulder - pt states the pain last for 2 weeks and occurred when she inhaled, the pain has now resolved.     Follow-up severe COPD with chronic respiratory failure oxygen dependent   She has missed many office visits. Last seen by my nurse practitioner FEb 2016. Says since then she is doing well without any COPD exacerbations or admissions or emergency room visits or prednisone  use. Smoking continues to be in remission. Compliant with oxygen. Also compliant with Spiriva and Advair. She's not had a flu shot or Prevnar the season and is reluctantly willing to have both because she understands the importance of these in her lung health maintenance. In September 2016 she did have a CT scan of the chest without contrast for possible lung nodule. I did not visualize this film but the report is that it is stable since 2014 and probably represents abnormal lung tissue   reports that she quit smoking about 3 years ago. Her smoking use included Cigarettes. She has a 25 pack-year smoking history. She has never used smokeless tobacco.   CT chest 08/27/15  IMPRESSION: 1. Stable mosaic pattern of lung attenuation as discussed above. 2. 18 mm right upper lobe ground-glass density is more likely an island of normal lung surrounded by residual abnormal lung. No interval change. Relatively stable since 2014. 3. No mediastinal or hilar mass or adenopathy. 4. Diffuse and marked fatty infiltration of the liver.   Electronically Signed  By: Rudie Meyer M.D.  On: 08/27/2015  15:25    CAT COPD Symptom & Quality of Life Score (GSK trademark) 0 is no burden. 5 is highest burden 09/27/2014   Never Cough -> Cough all the time 3  No phlegm in chest -> Chest is full of phlegm 3  No chest tightness -> Chest feels very tight 2  No dyspnea for 1 flight stairs/hill -> Very dyspneic for 1 flight of stairs 5  No limitations for ADL at home -> Very limited with ADL at home 2  Confident leaving home -> Not at all confident leaving home 0  Sleep soundly -> Do not sleep soundly because of lung condition 1  Lots of Energy -> No energy at all 3  TOTAL Score (max 40)  19       Immunization History  Administered Date(s) Administered  . Influenza Split 01/06/2011, 10/20/2012, 09/06/2013  . Influenza,inj,Quad PF,36+ Mos 09/27/2014  . Pneumococcal Polysaccharide-23 01/06/2011  . Tdap 09/06/2013     Current outpatient prescriptions:  .  albuterol (PROVENTIL HFA;VENTOLIN HFA) 108 (90 BASE) MCG/ACT inhaler, Inhale 2 puffs into the lungs every 6 (six) hours as needed for wheezing or shortness of breath., Disp: 8.5 g, Rfl: 1 .  Fluticasone-Salmeterol (ADVAIR) 250-50 MCG/DOSE AEPB, Inhale 1 puff into the lungs every 12 (twelve) hours., Disp: 60 each, Rfl: 6 .  ibuprofen (ADVIL,MOTRIN) 600 MG tablet, Take 1 tablet (600 mg total) by mouth every 6 (six) hours as needed., Disp: 30 tablet, Rfl: 0 .  methocarbamol (ROBAXIN) 500 MG tablet, Take 1-2 tablets (500-1,000 mg total) by mouth every 6 (six) hours as needed for muscle spasms., Disp: 60 tablet, Rfl: 0 .  metoCLOPramide (REGLAN) 5 MG tablet, Take 1 tablet (5 mg total) by mouth 4 (four) times daily. (Patient taking differently: Take 5 mg by mouth every 8 (eight) hours as needed. ), Disp: 120 tablet, Rfl: 0 .  ondansetron (ZOFRAN-ODT) 8 MG disintegrating tablet, Take 0.5 tablets (4 mg total) by mouth every 8 (eight) hours as needed for nausea., Disp: 20 tablet, Rfl: 0 .  polyethylene glycol (MIRALAX / GLYCOLAX) packet, Take 17 g by  mouth 2 (two) times daily., Disp: 28 each, Rfl: 1 .  tiotropium (SPIRIVA) 18 MCG inhalation capsule, Place 1 capsule (18 mcg total) into inhaler and inhale daily., Disp: 30 capsule, Rfl: 5 .  torsemide (DEMADEX) 20 MG tablet, Take 1 tablet (20 mg total) by mouth  daily. (Patient taking differently: Take 20 mg by mouth daily as needed. ), Disp: 90 tablet, Rfl: 3    Review of Systems  Positive for chronic shortness of breath and cough    Objective:   Physical Exam  Constitutional: She is oriented to person, place, and time. She appears well-developed and well-nourished. No distress.  Obese Oxygen on  HENT:  Head: Normocephalic and atraumatic.  Right Ear: External ear normal.  Left Ear: External ear normal.  Mouth/Throat: Oropharynx is clear and moist. No oropharyngeal exudate.  Eyes: Conjunctivae and EOM are normal. Pupils are equal, round, and reactive to light. Right eye exhibits no discharge. Left eye exhibits no discharge. No scleral icterus.  Neck: Normal range of motion. Neck supple. No JVD present. No tracheal deviation present. No thyromegaly present.  Cardiovascular: Normal rate, regular rhythm, normal heart sounds and intact distal pulses.  Exam reveals no gallop and no friction rub.   No murmur heard. Pulmonary/Chest: Effort normal and breath sounds normal. No respiratory distress. She has no wheezes. She has no rales. She exhibits no tenderness.  Abdominal: Soft. Bowel sounds are normal. She exhibits no distension and no mass. There is no tenderness. There is no rebound and no guarding.  Musculoskeletal: Normal range of motion. She exhibits no edema or tenderness.  Lymphadenopathy:    She has no cervical adenopathy.  Neurological: She is alert and oriented to person, place, and time. She has normal reflexes. No cranial nerve deficit. She exhibits normal muscle tone. Coordination normal.  Skin: Skin is warm and dry. No rash noted. She is not diaphoretic. No erythema. No pallor.   Psychiatric: She has a normal mood and affect. Her behavior is normal. Judgment and thought content normal.  Vitals reviewed.   Filed Vitals:   09/16/15 1519  BP: 122/80  Pulse: 93  Height: 5' 2.5" (1.588 m)  Weight: 201 lb 6.4 oz (91.354 kg)  SpO2: 94%        Assessment:       ICD-9-CM ICD-10-CM   1. Chronic respiratory failure with hypercapnia (HCC) 518.83 J96.12   2. Lung nodule 793.11 R91.1   3. Other emphysema (HCC) 492.8 J43.8        Plan:     COPD  - - continue oxygen   - make sure pulse ox is > 88% all the time  - continue advair 100/50 or two times daily -  - continue spiriva 1 puff daily - flu shot 09/16/2015 - prevnar 09/16/2015 -0 let us know wehen ready to meet with [facility] team  #Lung nodule - Stable since 2014. Probably represents abnormal lung tissue. No further follow-up.   #FOllowup  - 3 months or sooner if needed - CAT score  at fu - if falling sick or unwell let me know   Dr. Kalman Shan, M.D., [facility].C.P Pulmonary and Critical Care Medicine Staff Physician [facility] [place] [facility] Pager: [phone], If no answer or between  15:00h - 7:00h: call [phone]  09/16/2015 3:49 PM

## 2015-09-16 NOTE — Patient Instructions (Addendum)
#  COPD  - - continue oxygen   - make sure pulse ox is > 88% all the time  - continue advair 100/50 or two times daily -  - continue spiriva 1 puff daily - flu shot 09/16/2015 - prevnar 09/16/2015 -0 let us know wehen ready to meet with [facility] team  #Lung nodule - Stable since 2014. Probably represents abnormal lung tissue. No further follow-up.   #FOllowup  - 3 months or sooner if needed - CAT score  at fu - if falling sick or unwell let me know

## 2015-10-06 ENCOUNTER — Other Ambulatory Visit: Payer: Self-pay | Admitting: Internal Medicine

## 2015-11-13 ENCOUNTER — Emergency Department (HOSPITAL_COMMUNITY): Payer: 59

## 2015-11-13 ENCOUNTER — Inpatient Hospital Stay (HOSPITAL_COMMUNITY)
Admission: EM | Admit: 2015-11-13 | Discharge: 2015-11-17 | DRG: 190 | Disposition: A | Discharge status: Home / Self Care | Payer: 59 | Attending: Internal Medicine | Admitting: Internal Medicine

## 2015-11-13 ENCOUNTER — Encounter (HOSPITAL_COMMUNITY): Payer: Self-pay

## 2015-11-13 DIAGNOSIS — E662 Morbid (severe) obesity with alveolar hypoventilation: Secondary | ICD-10-CM | POA: Diagnosis present

## 2015-11-13 DIAGNOSIS — Z6837 Body mass index (BMI) 37.0-37.9, adult: Secondary | ICD-10-CM | POA: Diagnosis not present

## 2015-11-13 DIAGNOSIS — Z7951 Long term (current) use of inhaled steroids: Secondary | ICD-10-CM | POA: Diagnosis not present

## 2015-11-13 DIAGNOSIS — I2781 Cor pulmonale (chronic): Secondary | ICD-10-CM | POA: Diagnosis present

## 2015-11-13 DIAGNOSIS — Z9981 Dependence on supplemental oxygen: Secondary | ICD-10-CM

## 2015-11-13 DIAGNOSIS — Z791 Long term (current) use of non-steroidal anti-inflammatories (NSAID): Secondary | ICD-10-CM

## 2015-11-13 DIAGNOSIS — Z86711 Personal history of pulmonary embolism: Secondary | ICD-10-CM

## 2015-11-13 DIAGNOSIS — J438 Other emphysema: Secondary | ICD-10-CM

## 2015-11-13 DIAGNOSIS — J441 Chronic obstructive pulmonary disease with (acute) exacerbation: Secondary | ICD-10-CM | POA: Diagnosis present

## 2015-11-13 DIAGNOSIS — J9621 Acute and chronic respiratory failure with hypoxia: Secondary | ICD-10-CM | POA: Diagnosis present

## 2015-11-13 DIAGNOSIS — Z79899 Other long term (current) drug therapy: Secondary | ICD-10-CM

## 2015-11-13 DIAGNOSIS — J449 Chronic obstructive pulmonary disease, unspecified: Secondary | ICD-10-CM | POA: Diagnosis present

## 2015-11-13 DIAGNOSIS — I509 Heart failure, unspecified: Secondary | ICD-10-CM | POA: Diagnosis present

## 2015-11-13 DIAGNOSIS — R140 Abdominal distension (gaseous): Secondary | ICD-10-CM | POA: Diagnosis not present

## 2015-11-13 DIAGNOSIS — Z794 Long term (current) use of insulin: Secondary | ICD-10-CM

## 2015-11-13 DIAGNOSIS — I50812 Chronic right heart failure: Secondary | ICD-10-CM

## 2015-11-13 DIAGNOSIS — K5900 Constipation, unspecified: Secondary | ICD-10-CM

## 2015-11-13 DIAGNOSIS — G4733 Obstructive sleep apnea (adult) (pediatric): Secondary | ICD-10-CM | POA: Diagnosis present

## 2015-11-13 DIAGNOSIS — K7460 Unspecified cirrhosis of liver: Secondary | ICD-10-CM | POA: Diagnosis present

## 2015-11-13 DIAGNOSIS — Z87891 Personal history of nicotine dependence: Secondary | ICD-10-CM

## 2015-11-13 DIAGNOSIS — I272 Other secondary pulmonary hypertension: Secondary | ICD-10-CM | POA: Diagnosis present

## 2015-11-13 DIAGNOSIS — Z86718 Personal history of other venous thrombosis and embolism: Secondary | ICD-10-CM

## 2015-11-13 DIAGNOSIS — J440 Chronic obstructive pulmonary disease with acute lower respiratory infection: Secondary | ICD-10-CM | POA: Diagnosis not present

## 2015-11-13 LAB — BASIC METABOLIC PANEL
ANION GAP: 8 (ref 5–15)
BUN: 14 mg/dL (ref 6–20)
CALCIUM: 8.8 mg/dL — AB (ref 8.9–10.3)
CO2: 39 mmol/L — ABNORMAL HIGH (ref 22–32)
Chloride: 89 mmol/L — ABNORMAL LOW (ref 101–111)
Creatinine, Ser: 0.71 mg/dL (ref 0.44–1.00)
GLUCOSE: 124 mg/dL — AB (ref 65–99)
Potassium: 4.3 mmol/L (ref 3.5–5.1)
SODIUM: 136 mmol/L (ref 135–145)

## 2015-11-13 LAB — CBC
HCT: 48.6 % — ABNORMAL HIGH (ref 36.0–46.0)
HEMOGLOBIN: 15.2 g/dL — AB (ref 12.0–15.0)
MCH: 33 pg (ref 26.0–34.0)
MCHC: 31.3 g/dL (ref 30.0–36.0)
MCV: 105.7 fL — ABNORMAL HIGH (ref 78.0–100.0)
Platelets: 200 10*3/uL (ref 150–400)
RBC: 4.6 MIL/uL (ref 3.87–5.11)
RDW: 14.1 % (ref 11.5–15.5)
WBC: 9.8 10*3/uL (ref 4.0–10.5)

## 2015-11-13 LAB — BLOOD GAS, ARTERIAL
ACID-BASE EXCESS: 9.3 mmol/L — AB (ref 0.0–2.0)
Bicarbonate: 36.8 mEq/L — ABNORMAL HIGH (ref 20.0–24.0)
Drawn by: 308601
O2 CONTENT: 2 L/min
O2 Saturation: 85.1 %
PATIENT TEMPERATURE: 98.6
PCO2 ART: 63.4 mmHg — AB (ref 35.0–45.0)
PO2 ART: 52.2 mmHg — AB (ref 80.0–100.0)
TCO2: 32.2 mmol/L (ref 0–100)
pH, Arterial: 7.382 (ref 7.350–7.450)

## 2015-11-13 MED ORDER — METHYLPREDNISOLONE SODIUM SUCC 125 MG IJ SOLR
125.0000 mg | Freq: Once | INTRAMUSCULAR | Status: AC
Start: 2015-11-13 — End: 2015-11-13
  Administered 2015-11-13: 125 mg via INTRAVENOUS
  Filled 2015-11-13: qty 2

## 2015-11-13 MED ORDER — ALBUTEROL SULFATE (2.5 MG/3ML) 0.083% IN NEBU
5.0000 mg | INHALATION_SOLUTION | Freq: Once | RESPIRATORY_TRACT | Status: AC
Start: 2015-11-13 — End: 2015-11-13
  Administered 2015-11-13: 5 mg via RESPIRATORY_TRACT
  Filled 2015-11-13: qty 6

## 2015-11-13 NOTE — ED Notes (Signed)
Pt complains of being short of breath and having a low O2sat for a few days, normally for her it's 91%, and it was 88% at home, she wears 2 liters O2 all the time at home

## 2015-11-13 NOTE — H&P (Signed)
[facility] History and Physical  Mary HakimMichelle L Mickler ZOX:[id number] DOB: 07/15/1976 DOA: 11/13/2015  Referring physician: Nelva Nayobert Beaton, MD PCP: Elvina SidleLAUENSTEIN,KURT, MD   Chief Complaint: SOB  HPI: Mary [name] is a 39 y.o. female with history of COPD Cor Pulmonale [facility] PAH DVT of upper extremity remote history of PE presents with increased shortness of breath. She sates that the current episode started the past few days. She ahs been more tired and sleepy. She states taht she has been using her oxygen but has not had any improvement. She has been using her inhalers more often. She states that she has coughing which is more due to the shortness of breath itself. She feels tight also when she tries to breath. She has no chest pain. She she has noted no increase in her leg swelling. She states that she has been using her BIPAP at home. Patient states that she has no fevers or chills at this time. In the ED she had a CXR which did not show any presence of infiltrate.   Review of Systems:  Complete 12 point ROS performed and is unremarkable other than HPI.   Past Medical History  Diagnosis Date  . COPD (chronic obstructive pulmonary disease) (HCC)     PFTS 03/08/12: fev1 0.58L/1%, FVC 1.18/33%, Ratop 49 and c/w ssevere obstruction. 21% BD response on FVC, RV 219%, DLCO 11/54%  . History of alcoholism (HCC)   . Irregular menses   . DVT of upper extremity (deep vein thrombosis) [facility](HCC) April 2013    right subclavian // Unclear precipitating cause - possibly significant right heart failure, with vascular stasis potentially predisposing to clotting.    . Pulmonary embolus [facility](HCC) April 2013    Precipitating cause unclear. Was treated with coumadin from April-June 2013.  . Moderate to severe pulmonary hypertension [facility](HCC) April 2013    Cardiac cath on 03/06/12 - 1. Elevated pulmonary artery pressures, right sided filling pressures.,  2. PA: 64/45 (mean 53)    . Hepatic cirrhosis (HCC)     Questionable  history of - Noted on CT abdomen (03/2012) - thought to be due to vascular congestion from right heart failure +/- patient's history of alcohol abuse  . Axillary adenopathy     right axillary adenopathy noted on CT chest (03/06/2012)  . Periodontal disease   . Chronic right-sided CHF (congestive heart failure) (HCC) 03/23/2012    with cor pulmonale. Last RHC 03/2012  . Pneumonia ~ 1985; 01/2011  . History of chronic bronchitis   . Exertional shortness of breath   . On home oxygen therapy     "2-3 L 24/7" (05/03/2013)  . OSA (obstructive sleep apnea)     wears noctural BiPAP (05/03/2013)  . Migraine     "~ 1/yr" (05/03/2013)   Past Surgical History  Procedure Laterality Date  . Cardiac surgery  10/16/1977    "my heart was backwards" (05/03/2013)  . Lung surgery  10/16/1977  . Appendectomy  05/03/2013  . Cardiac catheterization  03/2012  . Laparoscopic appendectomy N/A 05/03/2013    Procedure: APPENDECTOMY LAPAROSCOPIC;  Surgeon: Cherylynn RidgesJames O Wyatt, MD;  [facility];  Service: General;  Laterality: N/A;  . Right heart catheterization N/A 03/07/2012    Procedure: RIGHT HEART CATH;  Surgeon: Kathleene Hazelhristopher D McAlhany, MD;  [facility];  Service: Cardiovascular;  Laterality: N/A;   Social History:  reports that she quit smoking about 3 years ago. Her smoking use included Cigarettes. She has a 25 pack-year smoking history.  She has never used smokeless tobacco. She reports that she does not drink alcohol or use illicit drugs.  No Known Allergies  Family History  Problem Relation Age of Onset  . Hypertension Father   . Heart disease Father     CHF; died age 38   . Alcohol abuse Father   . Other Brother     died age 91 y.o overdose     Prior to Admission medications   Medication Sig Start Date End Date Taking? Authorizing Provider  albuterol (PROVENTIL HFA;VENTOLIN HFA) 108 (90 BASE) MCG/ACT inhaler Inhale 2 puffs into the lungs every 6 (six) hours as needed for wheezing or shortness of  breath. Patient taking differently: Inhale 2 puffs into the lungs 4 (four) times daily.  09/06/14  Yes Kalman Shan, MD  Fluticasone-Salmeterol (ADVAIR) 250-50 MCG/DOSE AEPB Inhale 1 puff into the lungs every 12 (twelve) hours. 09/27/14  Yes Kalman Shan, MD  OXYGEN Inhale 2 L into the lungs continuous.   Yes Historical Provider, MD  SPIRIVA HANDIHALER 18 MCG inhalation capsule PLACE 1 CAPSULE (18 MCG TOTAL) INTO INHALER AND INHALE DAILY. 10/06/15  Yes Kalman Shan, MD  torsemide (DEMADEX) 20 MG tablet Take 1 tablet (20 mg total) by mouth daily. Patient taking differently: Take 20 mg by mouth daily as needed (for weight gain).  09/10/14  Yes Lewayne Bunting, MD  ibuprofen (ADVIL,MOTRIN) 600 MG tablet Take 1 tablet (600 mg total) by mouth every 6 (six) hours as needed. Patient not taking: Reported on 11/13/2015 07/25/15   Duane Lope, NP  methocarbamol (ROBAXIN) 500 MG tablet Take 1-2 tablets (500-1,000 mg total) by mouth every 6 (six) hours as needed for muscle spasms. Patient not taking: Reported on 11/13/2015 05/08/15   Ozella Rocks, MD   Physical Exam: Filed Vitals:   11/13/15 1910 11/13/15 2015 11/13/15 2030 11/13/15 2100  BP:  133/88 115/73 121/78  Pulse: 87 95 88 86  Temp:      TempSrc:      Resp: SpO2:  86% 94% 93%    Wt Readings from Last 3 Encounters:  09/16/15 91.354 kg (201 lb 6.4 oz)  07/25/15 87.998 kg (194 lb)  01/30/15 87.544 kg (193 lb)    General:  Appears calm and comfortable Eyes: PERRL, normal lids, irises & conjunctiva ENT: grossly normal hearing, lips & tongue Neck: no LAD, masses or thyromegaly Cardiovascular: RRR, no m/r/g. No LE edema Respiratory: overall decreased BS with few rhonchi noted Normal respiratory effort. Abdomen: soft, distended Skin: no rash or induration seen on limited exam Musculoskeletal: grossly normal tone BUE/BLE Psychiatric: speech fluent and appropriate Neurologic: grossly non-focal.          Labs on  Admission:  Basic Metabolic Panel:  Recent Labs Lab 11/13/15 1840  NA 136  K 4.3  CL 89*  CO2 39*  GLUCOSE 124*  BUN 14  CREATININE 0.71  CALCIUM 8.8*   Liver Function Tests: No results for input(s): AST, ALT, ALKPHOS, BILITOT, PROT, ALBUMIN in the last 168 hours. No results for input(s): LIPASE, AMYLASE in the last 168 hours. No results for input(s): AMMONIA in the last 168 hours. CBC:  Recent Labs Lab 11/13/15 1840  WBC 9.8  HGB 15.2*  HCT 48.6*  MCV 105.7*  PLT 200   Cardiac Enzymes: No results for input(s): CKTOTAL, CKMB, CKMBINDEX, TROPONINI in the last 168 hours.  BNP (last 3 results)  Recent Labs  12/10/14 1827 01/13/15 0049  BNP 48.5  86.5    ProBNP (last 3 results) No results for input(s): PROBNP in the last 8760 hours.  CBG: No results for input(s): GLUCAP in the last 168 hours.  Radiological Exams on Admission: Dg Chest 2 View  11/13/2015  CLINICAL DATA:  Shortness of breath, low O2 sats. EXAM: CHEST  2 VIEW COMPARISON:  CT chest 08/27/2015 and chest radiograph 12/14/2014. FINDINGS: Trachea is midline. Heart size within normal limits. Mild chronic accentuation of the pulmonary markings. No airspace consolidation or pleural fluid. IMPRESSION: No acute findings. Electronically Signed   By: Leanna Battles M.D.   On: 11/13/2015 20:26      Assessment/Plan Principal Problem:   Acute on chronic respiratory failure with hypoxia (HCC) Active Problems:   COPD (chronic obstructive pulmonary disease) (HCC)   Cor pulmonale (HCC)   COPD exacerbation (HCC)   1. Acute on chronic respiratory failure -will check ABG now in the ED -will continue with oxygen therapy -BIPAP as needed  2. COPD exacerbation -start on IV solumedrol -will place on empiric levaquin -continue with oxygen therapy -aggressive bronchodilator therapy -Gold protocol  3. Cor Pulmonale -patient has known history of PAH -being followed by Pulmonary as outpatient     Code  Status: full code DVT Prophylaxis:heparin Family Communication: none Disposition Plan: home    [facility] Pager [phone]

## 2015-11-13 NOTE — ED Provider Notes (Signed)
CSN: [id number]     Arrival date & time 11/13/15  1811 History   First MD Initiated Contact with Patient 11/13/15 1948     Chief Complaint  Patient presents with  . Shortness of Breath      HPI Pt complains of being short of breath and having a low O2sat for a few days, normally for her it's 91%, and it was 88% at home, she wears 2 liters O2 all the time at home Past Medical History  Diagnosis Date  . COPD (chronic obstructive pulmonary disease) (HCC)     PFTS 03/08/12: fev1 0.58L/1%, FVC 1.18/33%, Ratop 49 and c/w ssevere obstruction. 21% BD response on FVC, RV 219%, DLCO 11/54%  . History of alcoholism (HCC)   . Irregular menses   . DVT of upper extremity (deep vein thrombosis) [facility](HCC) April 2013    right subclavian // Unclear precipitating cause - possibly significant right heart failure, with vascular stasis potentially predisposing to clotting.    . Pulmonary embolus [facility](HCC) April 2013    Precipitating cause unclear. Was treated with coumadin from April-June 2013.  . Moderate to severe pulmonary hypertension [facility](HCC) April 2013    Cardiac cath on 03/06/12 - 1. Elevated pulmonary artery pressures, right sided filling pressures.,  2. PA: 64/45 (mean 53)    . Hepatic cirrhosis (HCC)     Questionable history of - Noted on CT abdomen (03/2012) - thought to be due to vascular congestion from right heart failure +/- patient's history of alcohol abuse  . Axillary adenopathy     right axillary adenopathy noted on CT chest (03/06/2012)  . Periodontal disease   . Chronic right-sided CHF (congestive heart failure) (HCC) 03/23/2012    with cor pulmonale. Last RHC 03/2012  . Pneumonia ~ 1985; 01/2011  . History of chronic bronchitis   . Exertional shortness of breath   . On home oxygen therapy     "2-3 L 24/7" (05/03/2013)  . OSA (obstructive sleep apnea)     wears noctural BiPAP (05/03/2013)  . Migraine     "~ 1/yr" (05/03/2013)   Past Surgical History  Procedure Laterality Date  . Cardiac surgery   10/16/1977    "my heart was backwards" (05/03/2013)  . Lung surgery  10/16/1977  . Appendectomy  05/03/2013  . Cardiac catheterization  03/2012  . Laparoscopic appendectomy N/A 05/03/2013    Procedure: APPENDECTOMY LAPAROSCOPIC;  Surgeon: Cherylynn RidgesJames O Wyatt, MD;  [facility];  Service: General;  Laterality: N/A;  . Right heart catheterization N/A 03/07/2012    Procedure: RIGHT HEART CATH;  Surgeon: Kathleene Hazelhristopher D McAlhany, MD;  [facility];  Service: Cardiovascular;  Laterality: N/A;   Family History  Problem Relation Age of Onset  . Hypertension Father   . Heart disease Father     CHF; died age 38   . Alcohol abuse Father   . Other Brother     died age 38 y.o overdose   Social History  Substance Use Topics  . Smoking status: Former Smoker -- 1.00 packs/day for 25 years    Types: Cigarettes    Quit date: 02/18/2012  . Smokeless tobacco: Never Used  . Alcohol Use: No     Comment: previously drinking 1 pint 3-4 days a week for 2002-2013, but in 2014, no longer drinking   OB History    No data available     Review of Systems  Constitutional: Negative for fever and chills.  Respiratory: Positive for cough and wheezing.  Allergies  Review of patient's allergies indicates no known allergies.  Home Medications   Prior to Admission medications   Medication Sig Start Date End Date Taking? Authorizing Provider  albuterol (PROVENTIL HFA;VENTOLIN HFA) 108 (90 BASE) MCG/ACT inhaler Inhale 2 puffs into the lungs every 6 (six) hours as needed for wheezing or shortness of breath. Patient taking differently: Inhale 2 puffs into the lungs 4 (four) times daily.  09/06/14  Yes Kalman Shan, MD  Fluticasone-Salmeterol (ADVAIR) 250-50 MCG/DOSE AEPB Inhale 1 puff into the lungs every 12 (twelve) hours. 09/27/14  Yes Kalman Shan, MD  OXYGEN Inhale 2 L into the lungs continuous.   Yes Historical Provider, MD  SPIRIVA HANDIHALER 18 MCG inhalation capsule PLACE 1 CAPSULE (18 MCG  TOTAL) INTO INHALER AND INHALE DAILY. 10/06/15  Yes Kalman Shan, MD  torsemide (DEMADEX) 20 MG tablet Take 1 tablet (20 mg total) by mouth daily. Patient taking differently: Take 20 mg by mouth daily as needed (for weight gain).  09/10/14  Yes Lewayne Bunting, MD  ibuprofen (ADVIL,MOTRIN) 600 MG tablet Take 1 tablet (600 mg total) by mouth every 6 (six) hours as needed. Patient not taking: Reported on 11/13/2015 07/25/15   Duane Lope, NP  methocarbamol (ROBAXIN) 500 MG tablet Take 1-2 tablets (500-1,000 mg total) by mouth every 6 (six) hours as needed for muscle spasms. Patient not taking: Reported on 11/13/2015 05/08/15   Ozella Rocks, MD   BP 121/78 mmHg  Pulse 86  Temp(Src) 98.2 F (36.8 C) (Oral)  Resp 23  SpO2 93%  LMP 11/06/2015 Physical Exam  Constitutional: She is oriented to person, place, and time. She appears well-developed and well-nourished. No distress.  HENT:  Head: Normocephalic and atraumatic.  Eyes: Pupils are equal, round, and reactive to light.  Neck: Normal range of motion.  Cardiovascular: Normal rate and intact distal pulses.   Pulmonary/Chest: Accessory muscle usage present. Tachypnea noted. She is in respiratory distress. She has wheezes.  Abdominal: Normal appearance. She exhibits no distension.  Musculoskeletal: Normal range of motion.  Neurological: She is alert and oriented to person, place, and time. No cranial nerve deficit.  Skin: Skin is warm and dry. No rash noted.  Psychiatric: She has a normal mood and affect. Her behavior is normal.  Nursing note and vitals reviewed.   ED Course  Procedures (including critical care time) Medications  methylPREDNISolone sodium succinate (SOLU-MEDROL) 125 mg/2 mL injection 125 mg (not administered)    Labs Review Labs Reviewed  BASIC METABOLIC PANEL - Abnormal; Notable for the following:    Chloride 89 (*)    CO2 39 (*)    Glucose, Bld 124 (*)    Calcium 8.8 (*)    All other components within  normal limits  CBC - Abnormal; Notable for the following:    Hemoglobin 15.2 (*)    HCT 48.6 (*)    MCV 105.7 (*)    All other components within normal limits  BLOOD GAS, ARTERIAL    Imaging Review Dg Chest 2 View  11/13/2015  CLINICAL DATA:  Shortness of breath, low O2 sats. EXAM: CHEST  2 VIEW COMPARISON:  CT chest 08/27/2015 and chest radiograph 12/14/2014. FINDINGS: Trachea is midline. Heart size within normal limits. Mild chronic accentuation of the pulmonary markings. No airspace consolidation or pleural fluid. IMPRESSION: No acute findings. Electronically Signed   By: Leanna Battles M.D.   On: 11/13/2015 20:26   I have personally reviewed and evaluated these images and lab results as  part of my medical decision-making.   EKG Interpretation   Date/Time:  Thursday November 13 2015 18:27:05 EST Ventricular Rate:  95 PR Interval:  165 QRS Duration: 80 QT Interval:  362 QTC Calculation: 455 R Axis:   110 Text Interpretation:  Sinus rhythm Biatrial enlargement Left posterior  fascicular block Probable lateral infarct, old Anteroseptal infarct, age  indeterminate No significant change since last tracing Confirmed by Shanoah Asbill   MD, Filippa Yarbough [phone]) on 11/13/2015 8:17:38 PM      MDM   Final diagnoses:  COPD exacerbation (HCC)        Nelva Nay, MD 11/17/15 [phone]

## 2015-11-13 NOTE — ED Notes (Signed)
Bed: WA05 Expected date:  Expected time:  Means of arrival:  Comments: Triage 6 

## 2015-11-14 DIAGNOSIS — J9621 Acute and chronic respiratory failure with hypoxia: Secondary | ICD-10-CM

## 2015-11-14 DIAGNOSIS — J441 Chronic obstructive pulmonary disease with (acute) exacerbation: Principal | ICD-10-CM

## 2015-11-14 DIAGNOSIS — I2781 Cor pulmonale (chronic): Secondary | ICD-10-CM

## 2015-11-14 LAB — BLOOD GAS, ARTERIAL
Acid-Base Excess: 7.1 mmol/L — ABNORMAL HIGH (ref 0.0–2.0)
Bicarbonate: 33.3 mEq/L — ABNORMAL HIGH (ref 20.0–24.0)
DRAWN BY: 358491
FIO2: 0.44
O2 SAT: 93.9 %
PO2 ART: 71 mmHg — AB (ref 80.0–100.0)
Patient temperature: 98.6
TCO2: 29 mmol/L (ref 0–100)
pCO2 arterial: 54.7 mmHg — ABNORMAL HIGH (ref 35.0–45.0)
pH, Arterial: 7.402 (ref 7.350–7.450)

## 2015-11-14 LAB — GLUCOSE, CAPILLARY
GLUCOSE-CAPILLARY: 141 mg/dL — AB (ref 65–99)
GLUCOSE-CAPILLARY: 181 mg/dL — AB (ref 65–99)
Glucose-Capillary: 232 mg/dL — ABNORMAL HIGH (ref 65–99)

## 2015-11-14 LAB — CBC
HCT: 47.9 % — ABNORMAL HIGH (ref 36.0–46.0)
Hemoglobin: 14.6 g/dL (ref 12.0–15.0)
MCH: 31.7 pg (ref 26.0–34.0)
MCHC: 30.5 g/dL (ref 30.0–36.0)
MCV: 104.1 fL — ABNORMAL HIGH (ref 78.0–100.0)
PLATELETS: 163 10*3/uL (ref 150–400)
RBC: 4.6 MIL/uL (ref 3.87–5.11)
RDW: 13.8 % (ref 11.5–15.5)
WBC: 8.2 10*3/uL (ref 4.0–10.5)

## 2015-11-14 LAB — INFLUENZA PANEL BY PCR (TYPE A & B)
H1N1FLUPCR: NOT DETECTED
INFLAPCR: NEGATIVE
INFLBPCR: NEGATIVE

## 2015-11-14 LAB — MRSA PCR SCREENING: MRSA by PCR: NEGATIVE

## 2015-11-14 LAB — CREATININE, SERUM: CREATININE: 0.61 mg/dL (ref 0.44–1.00)

## 2015-11-14 LAB — TSH: TSH: 1.675 u[IU]/mL (ref 0.350–4.500)

## 2015-11-14 MED ORDER — SODIUM CHLORIDE 0.9 % IV SOLN
INTRAVENOUS | Status: DC
  Administered 2015-11-14: 04:00:00 via INTRAVENOUS

## 2015-11-14 MED ORDER — ONDANSETRON HCL 4 MG PO TABS: 4.0000 mg | ORAL_TABLET | Freq: Four times a day (QID) | ORAL | Status: DC | PRN

## 2015-11-14 MED ORDER — ZOLPIDEM TARTRATE 5 MG PO TABS
5.0000 mg | ORAL_TABLET | Freq: Every evening | ORAL | Status: DC | PRN
  Administered 2015-11-14: 5 mg via ORAL
  Filled 2015-11-14: qty 1

## 2015-11-14 MED ORDER — HEPARIN SODIUM (PORCINE) 5000 UNIT/ML IJ SOLN
5000.0000 [IU] | Freq: Three times a day (TID) | INTRAMUSCULAR | Status: DC
  Administered 2015-11-14 – 2015-11-17 (×10): 5000 [IU] via SUBCUTANEOUS
  Filled 2015-11-14 (×13): qty 1

## 2015-11-14 MED ORDER — SODIUM CHLORIDE 0.9 % IJ SOLN
3.0000 mL | Freq: Two times a day (BID) | INTRAMUSCULAR | Status: DC
  Administered 2015-11-14 – 2015-11-17 (×7): 3 mL via INTRAVENOUS

## 2015-11-14 MED ORDER — INSULIN ASPART 100 UNIT/ML ~~LOC~~ SOLN
0.0000 [IU] | Freq: Three times a day (TID) | SUBCUTANEOUS | Status: DC
  Administered 2015-11-14 – 2015-11-15 (×4): 2 [IU] via SUBCUTANEOUS
  Administered 2015-11-15: 3 [IU] via SUBCUTANEOUS
  Administered 2015-11-16: 2 [IU] via SUBCUTANEOUS

## 2015-11-14 MED ORDER — INSULIN ASPART 100 UNIT/ML ~~LOC~~ SOLN
0.0000 [IU] | Freq: Every day | SUBCUTANEOUS | Status: DC
  Administered 2015-11-14: 2 [IU] via SUBCUTANEOUS

## 2015-11-14 MED ORDER — ACETAMINOPHEN 325 MG PO TABS
650.0000 mg | ORAL_TABLET | Freq: Four times a day (QID) | ORAL | Status: DC | PRN
  Administered 2015-11-14 – 2015-11-16 (×4): 650 mg via ORAL
  Filled 2015-11-14 (×4): qty 2

## 2015-11-14 MED ORDER — LEVOFLOXACIN IN D5W 750 MG/150ML IV SOLN
750.0000 mg | Freq: Every day | INTRAVENOUS | Status: DC
  Administered 2015-11-14 – 2015-11-16 (×4): 750 mg via INTRAVENOUS
  Filled 2015-11-14 (×4): qty 150

## 2015-11-14 MED ORDER — TORSEMIDE 20 MG PO TABS
20.0000 mg | ORAL_TABLET | Freq: Every day | ORAL | Status: DC | PRN
  Filled 2015-11-14: qty 1

## 2015-11-14 MED ORDER — ONDANSETRON HCL 4 MG/2ML IJ SOLN
4.0000 mg | Freq: Four times a day (QID) | INTRAMUSCULAR | Status: DC | PRN
  Administered 2015-11-16: 4 mg via INTRAVENOUS
  Filled 2015-11-14: qty 2

## 2015-11-14 MED ORDER — METHYLPREDNISOLONE SODIUM SUCC 125 MG IJ SOLR
60.0000 mg | Freq: Four times a day (QID) | INTRAMUSCULAR | Status: DC
  Administered 2015-11-14 – 2015-11-15 (×5): 60 mg via INTRAVENOUS
  Filled 2015-11-14 (×5): qty 2

## 2015-11-14 MED ORDER — MOMETASONE FURO-FORMOTEROL FUM 100-5 MCG/ACT IN AERO
2.0000 | INHALATION_SPRAY | Freq: Two times a day (BID) | RESPIRATORY_TRACT | Status: DC
  Administered 2015-11-14 – 2015-11-17 (×6): 2 via RESPIRATORY_TRACT
  Filled 2015-11-14: qty 8.8

## 2015-11-14 MED ORDER — OSELTAMIVIR PHOSPHATE 75 MG PO CAPS
75.0000 mg | ORAL_CAPSULE | Freq: Two times a day (BID) | ORAL | Status: DC
Start: 2015-11-14 — End: 2015-11-14
  Administered 2015-11-14: 75 mg via ORAL
  Filled 2015-11-14 (×2): qty 1

## 2015-11-14 MED ORDER — IPRATROPIUM-ALBUTEROL 0.5-2.5 (3) MG/3ML IN SOLN
3.0000 mL | Freq: Four times a day (QID) | RESPIRATORY_TRACT | Status: DC
  Administered 2015-11-14 – 2015-11-17 (×12): 3 mL via RESPIRATORY_TRACT
  Filled 2015-11-14 (×12): qty 3

## 2015-11-14 MED ORDER — TIOTROPIUM BROMIDE MONOHYDRATE 18 MCG IN CAPS: 18.0000 ug | ORAL_CAPSULE | Freq: Every day | RESPIRATORY_TRACT | Status: DC

## 2015-11-14 MED ORDER — POLYETHYLENE GLYCOL 3350 17 G PO PACK
17.0000 g | PACK | Freq: Every day | ORAL | Status: DC
  Administered 2015-11-14 – 2015-11-17 (×3): 17 g via ORAL
  Filled 2015-11-14 (×3): qty 1

## 2015-11-14 MED ORDER — IPRATROPIUM-ALBUTEROL 0.5-2.5 (3) MG/3ML IN SOLN
3.0000 mL | Freq: Four times a day (QID) | RESPIRATORY_TRACT | Status: DC
  Administered 2015-11-14: 3 mL via RESPIRATORY_TRACT
  Filled 2015-11-14: qty 3

## 2015-11-14 MED ORDER — ACETAMINOPHEN 650 MG RE SUPP: 650.0000 mg | Freq: Four times a day (QID) | RECTAL | Status: DC | PRN

## 2015-11-14 NOTE — Care Management Note (Signed)
Case Management Note  Patient Details  Name: Mary [name] MRN: [id number] Date of Birth: 01/02/1976  Subjective/Objective:             resp distress on bipap       Action/Plan:  Date: November 14, 2015 Chart reviewed for concurrent status and case management needs. Will continue to follow patient for changes and needs: Marcelle Smilinghonda Tylicia Sherman, RN, BSN, [place]   [phone]     Expected Discharge Date:                  Expected Discharge Plan:  Home/Self Care  In-House Referral:  NA  Discharge planning Services  CM Consult  Post Acute Care Choice:  NA Choice offered to:  NA  DME Arranged:    DME Agency:     HH Arranged:    HH Agency:     Status of Service:  In process, will continue to follow  Medicare Important Message Given:    Date Medicare IM Given:    Medicare IM give by:    Date Additional Medicare IM Given:    Additional Medicare Important Message give by:     If discussed at Long Length of Stay Meetings, dates discussed:    Additional Comments:  Golda AcreDavis, Ani Deoliveira Lynn, RN 11/14/2015, 10:38 AM

## 2015-11-14 NOTE — Progress Notes (Signed)
Pt prefers self placement.  Pt to notify RT if any assistance is required throughout the night.

## 2015-11-14 NOTE — Progress Notes (Signed)
ANTIBIOTIC CONSULT NOTE - INITIAL  Pharmacy Consult for levofloxacin Indication: Empiric COPD exacerbation  No Known Allergies  Patient Measurements: Height: 5\' 2"  (157.5 cm) Weight: 205 lb 0.4 oz (93 kg) IBW/kg (Calculated) : 50.1 Adjusted Body Weight:   Vital Signs: Temp: 98.8 F (37.1 C) (12/09 0241) Temp Source: Oral (12/09 0241) BP: 123/91 mmHg (12/09 0240) Pulse Rate: 88 (12/09 0305) Intake/Output from previous day: 12/08 0701 - 12/09 0700 In: 171.7 [I.V.:21.7; IV Piggyback:150] Out: -  Intake/Output from this shift: Total I/O In: 171.7 [I.V.:21.7; IV Piggyback:150] Out: -   Labs:  Recent Labs  11/13/15 1840  WBC 9.8  HGB 15.2*  PLT 200  CREATININE 0.71   Estimated Creatinine Clearance: 101.3 mL/min (by C-G formula based on Cr of 0.71). No results for input(s): VANCOTROUGH, VANCOPEAK, VANCORANDOM, GENTTROUGH, GENTPEAK, GENTRANDOM, TOBRATROUGH, TOBRAPEAK, TOBRARND, AMIKACINPEAK, AMIKACINTROU, AMIKACIN in the last 72 hours.   Microbiology: No results found for this or any previous visit (from the past 720 hour(s)).  Medical History: Past Medical History  Diagnosis Date  . COPD (chronic obstructive pulmonary disease) (HCC)     PFTS 03/08/12: fev1 0.58L/1%, FVC 1.18/33%, Ratop 49 and c/w ssevere obstruction. 21% BD response on FVC, RV 219%, DLCO 11/54%  . History of alcoholism (HCC)   . Irregular menses   . DVT of upper extremity (deep vein thrombosis) [facility](HCC) April 2013    right subclavian // Unclear precipitating cause - possibly significant right heart failure, with vascular stasis potentially predisposing to clotting.    . Pulmonary embolus [facility](HCC) April 2013    Precipitating cause unclear. Was treated with coumadin from April-June 2013.  . Moderate to severe pulmonary hypertension [facility](HCC) April 2013    Cardiac cath on 03/06/12 - 1. Elevated pulmonary artery pressures, right sided filling pressures.,  2. PA: 64/45 (mean 53)    . Hepatic cirrhosis (HCC)    Questionable history of - Noted on CT abdomen (03/2012) - thought to be due to vascular congestion from right heart failure +/- patient's history of alcohol abuse  . Axillary adenopathy     right axillary adenopathy noted on CT chest (03/06/2012)  . Periodontal disease   . Chronic right-sided CHF (congestive heart failure) (HCC) 03/23/2012    with cor pulmonale. Last RHC 03/2012  . Pneumonia ~ 1985; 01/2011  . History of chronic bronchitis   . Exertional shortness of breath   . On home oxygen therapy     "2-3 L 24/7" (05/03/2013)  . OSA (obstructive sleep apnea)     wears noctural BiPAP (05/03/2013)  . Migraine     "~ 1/yr" (05/03/2013)    Medications:  Anti-infectives    Start     Dose/Rate Route Frequency Ordered Stop   11/14/15 0245  levofloxacin (LEVAQUIN) IVPB 750 mg     750 mg 100 mL/hr over 90 Minutes Intravenous Daily at bedtime 11/14/15 0237       Assessment: Pt with acute on chronic respiratory failure with hypoxia and COPD exacerbation.  Levofloxacin for empiric therapy.  Goal of Therapy:  Levofloxacin dosed based on patient weight and renal function  Plan: Levofloxacin 750mg  iv q24hr Follow up culture results  Mary DavidsonGrimsley [name], Mary [name] Mary [name] 11/14/2015,4:05 AM

## 2015-11-14 NOTE — Progress Notes (Signed)
[facility] Progress Note   Mary [name]  ZOX:[id number]  DOB: [birth year]  DOA: 11/13/2015 PCP: Elvina Sidle, MD  Brief narrative: Mary [name] is a 39 y.o. female with COPD on 2-3 of O2 at home, Cor pulmonale/ pulm HTN, DVT and PE in the past who presents for dyspnea for past few days which occurs on mild exertion. No cough, fevers or chills. Found to be hypoxic with use of accessory muscles and wheezing in the ER and admitted to Step down.    Subjective: Dyspnea is better but she has not tried to ambulate. No cough or chest pain.   Assessment/Plan: Principal Problem:   Acute on chronic respiratory failure with hypoxia /  COPD exacerbation  - IV Solu-Medrol, nebulizer treatments, oxygen and levofloxacin -Has had a slight improvement since being admitted-currently requiring 4-5 L to keep her pulse ox above 90% and requiring BiPAP intermittently for respiratory distress - flu swab  Active Problems:    Cor pulmonale   -When necessary Lasix has been ordered-appears euvolemic    Code Status:     Code Status Orders        Start     Ordered   11/14/15 0233  Full code   Continuous     11/14/15 0232     Family Communication:  Disposition Plan: --Continue to follow in the stepdown unit DVT prophylaxis: Heparin Consultants: Procedures:  Antibiotics: Anti-infectives    Start     Dose/Rate Route Frequency Ordered Stop   11/14/15 1000  oseltamivir (TAMIFLU) capsule 75 mg     75 mg Oral 2 times daily 11/14/15 0811 11/19/15 0959   11/14/15 0245  levofloxacin (LEVAQUIN) IVPB 750 mg     750 mg 100 mL/hr over 90 Minutes Intravenous Daily at bedtime 11/14/15 0237        Objective: Filed Weights   11/14/15 0241  Weight: 93 kg (205 lb 0.4 oz)    Intake/Output Summary (Last 24 hours) at 11/14/15 1107 Last data filed at 11/14/15 0900  Gross per 24 hour  Intake 321.67 ml  Output    350 ml  Net -28.33 ml     Vitals Filed Vitals:   11/14/15 0600  11/14/15 0615 11/14/15 0620 11/14/15 0742  BP: 120/83     Pulse: 70 78 81   Temp:    98.4 F (36.9 C)  TempSrc:    Oral  Resp: Height:      Weight:      SpO2: 96% 94% 94% 92%    Exam:  General:  Pt is alert, not in acute distress  HEENT: No icterus, No thrush, oral mucosa moist  Cardiovascular: regular rate and rhythm, S1/S2 No murmur  Respiratory: Mild scattered wheezing, no respiratory distress and able to speak in full sentences.  Abdomen: Soft, +Bowel sounds, non tender, non distended, no guarding  MSK: No LE edema, cyanosis or clubbing  Data Reviewed: Basic Metabolic Panel:  Recent Labs Lab 11/13/15 1840 11/14/15 0348  NA 136  --   K 4.3  --   CL 89*  --   CO2 39*  --   GLUCOSE 124*  --   BUN 14  --   CREATININE 0.71 0.61  CALCIUM 8.8*  --    Liver Function Tests: No results for input(s): AST, ALT, ALKPHOS, BILITOT, PROT, ALBUMIN in the last 168 hours. No results for input(s): LIPASE, AMYLASE in the last 168 hours. No results for input(s): AMMONIA in the last 168  hours. CBC:  Recent Labs Lab 11/13/15 1840 11/14/15 0348  WBC 9.8 8.2  HGB 15.2* 14.6  HCT 48.6* 47.9*  MCV 105.7* 104.1*  PLT 200 163   Cardiac Enzymes: No results for input(s): CKTOTAL, CKMB, CKMBINDEX, TROPONINI in the last 168 hours. BNP (last 3 results)  Recent Labs  12/10/14 1827 01/13/15 0049  BNP 48.5 86.5    ProBNP (last 3 results) No results for input(s): PROBNP in the last 8760 hours.  CBG:  Recent Labs Lab 11/14/15 0758  GLUCAP 141*    Recent Results (from the past 240 hour(s))  MRSA PCR Screening     Status: None   Collection Time: 11/14/15  2:33 AM  Result Value Ref Range Status   MRSA by PCR NEGATIVE NEGATIVE Final    Comment:        The GeneXpert MRSA Assay (FDA approved for NASAL specimens only), is one component of a comprehensive MRSA colonization surveillance program. It is not intended to diagnose MRSA infection nor to guide  or monitor treatment for MRSA infections.      Studies: Dg Chest 2 View  11/13/2015  CLINICAL DATA:  Shortness of breath, low O2 sats. EXAM: CHEST  2 VIEW COMPARISON:  CT chest 08/27/2015 and chest radiograph 12/14/2014. FINDINGS: Trachea is midline. Heart size within normal limits. Mild chronic accentuation of the pulmonary markings. No airspace consolidation or pleural fluid. IMPRESSION: No acute findings. Electronically Signed   By: Leanna BattlesMelinda  Blietz M.D.   On: 11/13/2015 20:26    Scheduled Meds:  Scheduled Meds: . heparin  5,000 Units Subcutaneous 3 times per day  . insulin aspart  0-5 Units Subcutaneous QHS  . insulin aspart  0-9 Units Subcutaneous TID WC  . ipratropium-albuterol  3 mL Nebulization QID  . levofloxacin (LEVAQUIN) IV  750 mg Intravenous QHS  . methylPREDNISolone (SOLU-MEDROL) injection  60 mg Intravenous Q6H  . mometasone-formoterol  2 puff Inhalation BID  . oseltamivir  75 mg Oral BID  . sodium chloride  3 mL Intravenous Q12H   Continuous Infusions: . sodium chloride 50 mL/hr at 11/14/15 0334    Time spent on care of this patient: 35 min   Cache Bills, MD 11/14/2015, 11:07 AM  LOS: 1 day   [facility] Office  [phone] Pager - Text Page per [email or web address] If 7PM-7AM, please contact night-coverage [email or web address]

## 2015-11-14 NOTE — Progress Notes (Signed)
OT Cancellation Note  Patient Details Name: Jana HakimMichelle L Zelek MRN: [id number] DOB: 01/31/1976   Cancelled Treatment:    Reason Eval/Treat Not Completed: Other (comment). Cancelled due to fatique (per nursing). Will check back.  [place] Kirchner 11/14/2015, 10:57 AM  Marica OtterMaryellen Lamichael Youkhana, OTR/L [phone] 11/14/2015

## 2015-11-14 NOTE — Progress Notes (Signed)
Initial Nutrition Assessment  DOCUMENTATION CODES:   Not applicable  INTERVENTION:  -No RD interventions warranted at this time   NUTRITION DIAGNOSIS:   Inadequate oral intake related to poor appetite, other (see comment) (shortness of breath) as evidenced by per patient/family report.   GOAL:   Patient will meet greater than or equal to 90% of their needs   MONITOR:   PO intake, I & O's, Labs, Skin  REASON FOR ASSESSMENT:   Consult Assessment of nutrition requirement/status  ASSESSMENT:   Mary [name] is a 39 y.o. female with history of COPD Cor Pulmonale [facility] PAH DVT of upper extremity remote history of PE presents with increased shortness of breath. She sates that the current episode started the past few days. She ahs been more tired and sleepy. She states taht she has been using her oxygen but has not had any improvement. She has been using her inhalers more often. She states that she has coughing which is more due to the shortness of breath itself. She feels tight also when she tries to breath  Spoke with pt at bedside.  She states SOB has been severe x1 wk. Poor oral intake accompanied this, but was good outside of this time span.  Pt is currently on hh/carb modified diet due to ascites and high blood sugar related to solumedrol.  States her usual weight is about 185#, currently 205# likely related to ascites.  Pt declined snacks between meals at this time, no other supplements warranted.  Labs and Medications reviewed.  Diet Order:  Diet heart healthy/carb modified Room service appropriate?: Yes; Fluid consistency:: Thin  Skin:  Reviewed, no issues  Last BM:  11/13/2015  Height:   Ht Readings from Last 1 Encounters:  11/14/15 5\' 2"  (1.575 m)    Weight:   Wt Readings from Last 1 Encounters:  11/14/15 205 lb 0.4 oz (93 kg)    Ideal Body Weight:  50 kg  BMI:  Body mass index is 37.49 kg/(m^2).  Estimated Nutritional Needs:   Kcal:  1800-2000  calories  Protein:  75-93 grams  Fluid:  >/= 1.8L  EDUCATION NEEDS:   No education needs identified at this time  Mary AnoWilliam M. Mary Egolf, MS, RD LDN After Hours/Weekend Pager [phone]

## 2015-11-14 NOTE — Progress Notes (Signed)
PT Cancellation Note  Patient Details Name: Jana HakimMichelle L Lamartina MRN: [id number] DOB: 11/29/1976   Cancelled Treatment:     PT eval order received but deferred at request of RN 2* pt fatigue.  Will follow.   Rielly Corlett 11/14/2015, 12:16 PM

## 2015-11-14 NOTE — Progress Notes (Signed)
CSW received consult for COPD Gold Protocol, though patient does not meet qualifications (only 1 admission within the past 6 months).  Inappropriate CSW referral.  CSW signing off.   Spruha Weight, MSW, LCSW Clinical Social Work [phone] 

## 2015-11-15 ENCOUNTER — Inpatient Hospital Stay (HOSPITAL_COMMUNITY): Payer: 59

## 2015-11-15 DIAGNOSIS — R140 Abdominal distension (gaseous): Secondary | ICD-10-CM

## 2015-11-15 LAB — HEMOGLOBIN A1C
Hgb A1c MFr Bld: 5.6 % (ref 4.8–5.6)
Mean Plasma Glucose: 114 mg/dL

## 2015-11-15 LAB — GLUCOSE, CAPILLARY
GLUCOSE-CAPILLARY: 175 mg/dL — AB (ref 65–99)
Glucose-Capillary: 233 mg/dL — ABNORMAL HIGH (ref 65–99)
Glucose-Capillary: 151 mg/dL — ABNORMAL HIGH (ref 65–99)
Glucose-Capillary: 194 mg/dL — ABNORMAL HIGH (ref 65–99)

## 2015-11-15 LAB — PREGNANCY, URINE: PREG TEST UR: NEGATIVE

## 2015-11-15 MED ORDER — LORAZEPAM 0.5 MG PO TABS
0.5000 mg | ORAL_TABLET | Freq: Once | ORAL | Status: DC
  Filled 2015-11-15: qty 1

## 2015-11-15 NOTE — Evaluation (Signed)
Physical Therapy Evaluation-1x Patient Details Name: Jana HakimMichelle L Buescher MRN: [id number] DOB: 05/23/1976 Today's Date: 11/15/2015   History of Present Illness  39 yo femael admitted with acute respiratory failure. Hx of COPD, cor pulmonale, pulm HTN, DVT, PE, sleep apnea.   Clinical Impression  On eval, pt was Mod Ind with mobility-walked ~250 feet. O2 sats 87-88% on 4L O2 during ambulation. Dyspnea 2/4. Will sign off. No further PT needs.     Follow Up Recommendations No PT follow up    Equipment Recommendations  None recommended by PT    Recommendations for Other Services       Precautions / Restrictions Precautions Precautions: None Restrictions Weight Bearing Restrictions: No      Mobility  Bed Mobility Overal bed mobility: Independent                Transfers Overall transfer level: Independent                  Ambulation/Gait Ambulation/Gait assistance: Modified independent (Device/Increase time) Ambulation Distance (Feet): 250 Feet         General Gait Details: O2 sats 87-88% on 4L O2 during ambulation. Dyspnea 2/4. Tolerated distance well  Stairs            Wheelchair Mobility    Modified Rankin (Stroke Patients Only)       Balance                                             Pertinent Vitals/Pain Pain Assessment: No/denies pain    Home Living Family/patient expects to be discharged to:: Private residence Living Arrangements: Spouse/significant other             Home Equipment: None      Prior Function Level of Independence: Independent               Hand Dominance        Extremity/Trunk Assessment   Upper Extremity Assessment: Overall WFL for tasks assessed           Lower Extremity Assessment: Overall WFL for tasks assessed      Cervical / Trunk Assessment: Normal  Communication   Communication: No difficulties  Cognition Arousal/Alertness: Awake/alert Behavior During  Therapy: WFL for tasks assessed/performed Overall Cognitive Status: Within Functional Limits for tasks assessed                      General Comments      Exercises        Assessment/Plan    PT Assessment Patent does not need any further PT services  PT Diagnosis     PT Problem List    PT Treatment Interventions     PT Goals (Current goals can be found in the Care Plan section) Acute Rehab PT Goals Patient Stated Goal: home soon PT Goal Formulation: All assessment and education complete, DC therapy    Frequency     Barriers to discharge        Co-evaluation               End of Session   Activity Tolerance: Patient tolerated treatment well Patient left: in bed;with call bell/phone within reach           Time: 1416-1430 PT Time Calculation (min) (ACUTE ONLY): 14 min   Charges:   PT Evaluation $Initial  PT Evaluation Tier I: 1 Procedure     PT G Codes:        Rebeca Alert, MPT Pager: [phone]

## 2015-11-15 NOTE — Progress Notes (Signed)
[facility] Progress Note   Mary [name]  ZOX:[id number]  DOB: [birth year]  DOA: 11/13/2015 PCP: Mary Sidle, MD  Brief narrative: Mary [name] is a 39 y.o. female with COPD on 2-3 of O2 at home, Cor pulmonale/ pulm HTN, DVT and PE in the past who presents for dyspnea for past few days which occurs on mild exertion. No cough, fevers or chills. Found to be hypoxic with use of accessory muscles and wheezing in the ER. Started on BiPAP and admitted to Step down.    Subjective: No complaints of dyspnea. Has a mild dry cough. No chest pain.   Assessment/Plan: Principal Problem:   Acute on chronic respiratory failure with hypoxia /  COPD exacerbation  -wean Solu-Medrol to Prednisone- cont nebulizer treatments, oxygen and levofloxacin - ambulated and follow pulse ox - flu negative  Active Problems:    Cor pulmonale   -When necessary Lasix has been ordered-appears euvolemic  Abdominal distension/ constipation - has had a BM this AM but abdomen still quite distended- non-tender- obtain portable xray    Code Status:     Code Status Orders        Start     Ordered   11/14/15 0233  Full code   Continuous     11/14/15 0232     Family Communication:  Disposition Plan: -transfer to med surg DVT prophylaxis: Heparin Consultants: Procedures:  Antibiotics: Anti-infectives    Start     Dose/Rate Route Frequency Ordered Stop   11/14/15 1000  oseltamivir (TAMIFLU) capsule 75 mg  Status:  Discontinued     75 mg Oral 2 times daily 11/14/15 0811 11/14/15 1139   11/14/15 0245  levofloxacin (LEVAQUIN) IVPB 750 mg     750 mg 100 mL/hr over 90 Minutes Intravenous Daily at bedtime 11/14/15 0237        Objective: Filed Weights   11/14/15 0241  Weight: 93 kg (205 lb 0.4 oz)   No intake or output data in the 24 hours ending 11/15/15 0922   Vitals Filed Vitals:   11/15/15 0200 11/15/15 0400 11/15/15 0600 11/15/15 0800  BP: 126/73 114/58 136/74   Pulse: 55 53  58   Temp:  97.1 F (36.2 C)  97.4 F (36.3 C)  TempSrc:  Axillary  Oral  Resp: Height:      Weight:      SpO2: 88% 88% 91%     Exam:  General:  Pt is alert, not in acute distress  HEENT: No icterus, No thrush, oral mucosa moist  Cardiovascular: regular rate and rhythm, S1/S2 No murmur  Respiratory: Mild rhonchi, no respiratory distress    Abdomen: Soft, +Bowel sounds, non tender, + distension, no guarding  MSK: No LE edema, cyanosis or clubbing  Data Reviewed: Basic Metabolic Panel:  Recent Labs Lab 11/13/15 1840 11/14/15 0348  NA 136  --   K 4.3  --   CL 89*  --   CO2 39*  --   GLUCOSE 124*  --   BUN 14  --   CREATININE 0.71 0.61  CALCIUM 8.8*  --    Liver Function Tests: No results for input(s): AST, ALT, ALKPHOS, BILITOT, PROT, ALBUMIN in the last 168 hours. No results for input(s): LIPASE, AMYLASE in the last 168 hours. No results for input(s): AMMONIA in the last 168 hours. CBC:  Recent Labs Lab 11/13/15 1840 11/14/15 0348  WBC 9.8 8.2  HGB 15.2* 14.6  HCT 48.6* 47.9*  MCV  105.7* 104.1*  PLT 200 163   Cardiac Enzymes: No results for input(s): CKTOTAL, CKMB, CKMBINDEX, TROPONINI in the last 168 hours. BNP (last 3 results)  Recent Labs  12/10/14 1827 01/13/15 0049  BNP 48.5 86.5    ProBNP (last 3 results) No results for input(s): PROBNP in the last 8760 hours.  CBG:  Recent Labs Lab 11/14/15 0758 11/14/15 1235 11/14/15 2201 11/15/15 0722  GLUCAP 141* 181* 232* 175*    Recent Results (from the past 240 hour(s))  MRSA PCR Screening     Status: None   Collection Time: 11/14/15  2:33 AM  Result Value Ref Range Status   MRSA by PCR NEGATIVE NEGATIVE Final    Comment:        The GeneXpert MRSA Assay (FDA approved for NASAL specimens only), is one component of a comprehensive MRSA colonization surveillance program. It is not intended to diagnose MRSA infection nor to guide or monitor treatment for MRSA  infections.      Studies: Dg Chest 2 View  11/13/2015  CLINICAL DATA:  Shortness of breath, low O2 sats. EXAM: CHEST  2 VIEW COMPARISON:  CT chest 08/27/2015 and chest radiograph 12/14/2014. FINDINGS: Trachea is midline. Heart size within normal limits. Mild chronic accentuation of the pulmonary markings. No airspace consolidation or pleural fluid. IMPRESSION: No acute findings. Electronically Signed   By: Leanna BattlesMelinda  Blietz M.D.   On: 11/13/2015 20:26    Scheduled Meds:  Scheduled Meds: . heparin  5,000 Units Subcutaneous 3 times per day  . insulin aspart  0-5 Units Subcutaneous QHS  . insulin aspart  0-9 Units Subcutaneous TID WC  . ipratropium-albuterol  3 mL Nebulization QID  . levofloxacin (LEVAQUIN) IV  750 mg Intravenous QHS  . LORazepam  0.5 mg Oral Once  . mometasone-formoterol  2 puff Inhalation BID  . polyethylene glycol  17 g Oral Daily  . sodium chloride  3 mL Intravenous Q12H   Continuous Infusions:    Time spent on care of this patient: 35 min   Bertil Brickey, MD 11/15/2015, 9:22 AM  LOS: 2 days   [facility] Office  [phone] Pager - Text Page per [email or web address] If 7PM-7AM, please contact night-coverage [email or web address]

## 2015-11-15 NOTE — Progress Notes (Signed)
Patient arrived to unit.  4L Alpha.  Denies pain.  A&O x4.  Lungs have expiratory coarse wheezes upon auscultation.

## 2015-11-16 LAB — GLUCOSE, CAPILLARY
GLUCOSE-CAPILLARY: 182 mg/dL — AB (ref 65–99)
GLUCOSE-CAPILLARY: 172 mg/dL — AB (ref 65–99)
Glucose-Capillary: 102 mg/dL — ABNORMAL HIGH (ref 65–99)
Glucose-Capillary: 119 mg/dL — ABNORMAL HIGH (ref 65–99)

## 2015-11-16 MED ORDER — METHYLPREDNISOLONE SODIUM SUCC 40 MG IJ SOLR
40.0000 mg | Freq: Every day | INTRAMUSCULAR | Status: DC
  Administered 2015-11-16 – 2015-11-17 (×2): 40 mg via INTRAVENOUS
  Filled 2015-11-16 (×3): qty 1

## 2015-11-16 NOTE — Progress Notes (Addendum)
Patient remains on nocturnal CPAP therapy, with 5 liters/minute of oxygen flow via bleed-in. Pulse oximetry readings were remained consistently above > 92%. There was no evidence of transient nocturnal desaturations to < 88 %. Patient reported significant improvement of respiratory symptoms such as  improved activity tolerance and decreased dyspnea frequency and intensity. Vital signs remained stable. Bedside hand-off communication report provided to receiving RN, Jerene CannyGwen Hairston, at 01:50.

## 2015-11-16 NOTE — Plan of Care (Signed)
Problem: Safety: Goal: Ability to remain free from injury will improve Outcome: Progressing Falls prevention protocol maintained. Reviewed with patient and reinforced the importance of adherence to safety measures to reduce likelihood for falls or fall-related injuries, such as appropriate use of the call bell,  maintaining the bed in low and locked position, keeping needed items within easy access and consistent wearing of non-skid footwear during ambulation attempts. Patient verbalized understanding and demonstrated compliance.         

## 2015-11-16 NOTE — Progress Notes (Signed)
[facility] Progress Note   Mary [name]  ZOX:[id number]  DOB: 09-21-1976  DOA: 11/13/2015 PCP: Elvina Sidle, MD  Brief narrative: Mary [name] is a 39 y.o. female with COPD on 2-3 of O2 at home, Cor pulmonale/ pulm HTN, DVT and PE in the past who presents for dyspnea for past few days which occurs on mild exertion. No cough, fevers or chills. Found to be hypoxic with use of accessory muscles and wheezing in the ER. Started on BiPAP and admitted to Step down.    Subjective: Feels tired today. Did not get much sleepy. Has had numerous BMs (8-10) but abdomen is still distended. She had this issue in Feb of this year and was sent home with Reglan and Miralax. She eventually stopped taking them.   Assessment/Plan: Principal Problem: Acute on chronic respiratory failure with hypoxia /  COPD exacerbation  -still wheezing on exam but pulse ox improved- weaning Solu-Medrol- will make it daily today-  cont nebulizer treatments, oxygen and levofloxacin - ambulate and follow pulse ox - flu negative  Active Problems:  Cor pulmonale   -When necessary Lasix has been ordered-appears euvolemic  Abdominal distension/ constipation - abd xray showed a large stool burden- on exam abd slightly less distended after numerous BMs however she will need more MIralax today which she is in agreement with. - have discussed the fact that she will need to stay on a stool softener and PRN Miralax at home to prevent this from recurring.     Code Status:     Code Status Orders        Start     Ordered   11/14/15 0233  Full code   Continuous     11/14/15 0232     Family Communication:  Disposition Plan: - hopefully home tomorrow DVT prophylaxis: Heparin Consultants: Procedures:  Antibiotics: Anti-infectives    Start     Dose/Rate Route Frequency Ordered Stop   11/14/15 1000  oseltamivir (TAMIFLU) capsule 75 mg  Status:  Discontinued     75 mg Oral 2 times daily 11/14/15 0811  11/14/15 1139   11/14/15 0245  levofloxacin (LEVAQUIN) IVPB 750 mg     750 mg 100 mL/hr over 90 Minutes Intravenous Daily at bedtime 11/14/15 0237        Objective: Filed Weights   11/14/15 0241  Weight: 93 kg (205 lb 0.4 oz)    Intake/Output Summary (Last 24 hours) at 11/16/15 1020 Last data filed at 11/16/15 0857  Gross per 24 hour  Intake    483 ml  Output      0 ml  Net    483 ml     Vitals Filed Vitals:   11/15/15 1607 11/15/15 1949 11/15/15 2143 11/16/15 0545  BP:   119/70 97/54  Pulse:   74 62  Temp:   97.8 F (36.6 C) 97.8 F (36.6 C)  TempSrc:   Oral Oral  Resp:   16 18  Height:      Weight:      SpO2: 92% 92% 95% 100%    Exam:  General:  Pt is alert, not in acute distress  HEENT: No icterus, No thrush, oral mucosa moist  Cardiovascular: regular rate and rhythm, S1/S2 No murmur  Respiratory: Mild rhonchi, no respiratory distress    Abdomen: Soft, +Bowel sounds, non tender, + distension, no guarding  MSK: No LE edema, cyanosis or clubbing  Data Reviewed: Basic Metabolic Panel:  Recent Labs Lab 11/13/15 1840 11/14/15 0348  NA 136  --   K 4.3  --   CL 89*  --   CO2 39*  --   GLUCOSE 124*  --   BUN 14  --   CREATININE 0.71 0.61  CALCIUM 8.8*  --    Liver Function Tests: No results for input(s): AST, ALT, ALKPHOS, BILITOT, PROT, ALBUMIN in the last 168 hours. No results for input(s): LIPASE, AMYLASE in the last 168 hours. No results for input(s): AMMONIA in the last 168 hours. CBC:  Recent Labs Lab 11/13/15 1840 11/14/15 0348  WBC 9.8 8.2  HGB 15.2* 14.6  HCT 48.6* 47.9*  MCV 105.7* 104.1*  PLT 200 163   Cardiac Enzymes: No results for input(s): CKTOTAL, CKMB, CKMBINDEX, TROPONINI in the last 168 hours. BNP (last 3 results)  Recent Labs  12/10/14 1827 01/13/15 0049  BNP 48.5 86.5    ProBNP (last 3 results) No results for input(s): PROBNP in the last 8760 hours.  CBG:  Recent Labs Lab 11/15/15 0722 11/15/15 1142  11/15/15 1703 11/15/15 2142 11/16/15 0728  GLUCAP 175* 233* 151* 194* 102*    Recent Results (from the past 240 hour(s))  MRSA PCR Screening     Status: None   Collection Time: 11/14/15  2:33 AM  Result Value Ref Range Status   MRSA by PCR NEGATIVE NEGATIVE Final    Comment:        The GeneXpert MRSA Assay (FDA approved for NASAL specimens only), is one component of a comprehensive MRSA colonization surveillance program. It is not intended to diagnose MRSA infection nor to guide or monitor treatment for MRSA infections.      Studies: Dg Abd Portable 1v  11/15/2015  CLINICAL DATA:  One week history of upper abdominal pain and bloating. EXAM: PORTABLE ABDOMEN - 1 VIEW COMPARISON:  PET-CT 01/18/2015.  CT abdomen and pelvis 12/13/2014. FINDINGS: The report for this examination was delayed as the initial image had to be repeated for technical reasons. Bowel gas pattern unremarkable without evidence of obstruction or significant ileus. Moderate stool burden in the colon. No abnormal calcifications. Regional skeleton intact. IMPRESSION: No acute abdominal abnormality.  Moderate colonic stool burden. Electronically Signed   By: Hulan Saashomas  Lawrence M.D.   On: 11/15/2015 13:32    Scheduled Meds:  Scheduled Meds: . heparin  5,000 Units Subcutaneous 3 times per day  . insulin aspart  0-5 Units Subcutaneous QHS  . insulin aspart  0-9 Units Subcutaneous TID WC  . ipratropium-albuterol  3 mL Nebulization QID  . levofloxacin (LEVAQUIN) IV  750 mg Intravenous QHS  . LORazepam  0.5 mg Oral Once  . mometasone-formoterol  2 puff Inhalation BID  . polyethylene glycol  17 g Oral Daily  . sodium chloride  3 mL Intravenous Q12H   Continuous Infusions:    Time spent on care of this patient: 35 min   Mary Brutus, MD 11/16/2015, 10:20 AM  LOS: 3 days   [facility] Office  [phone] Pager - Text Page per [email or web address] If 7PM-7AM, please contact night-coverage  [email or web address]

## 2015-11-17 DIAGNOSIS — K5900 Constipation, unspecified: Secondary | ICD-10-CM

## 2015-11-17 DIAGNOSIS — G4733 Obstructive sleep apnea (adult) (pediatric): Secondary | ICD-10-CM

## 2015-11-17 DIAGNOSIS — E662 Morbid (severe) obesity with alveolar hypoventilation: Secondary | ICD-10-CM

## 2015-11-17 DIAGNOSIS — J440 Chronic obstructive pulmonary disease with acute lower respiratory infection: Secondary | ICD-10-CM

## 2015-11-17 LAB — BASIC METABOLIC PANEL
Anion gap: 8 (ref 5–15)
BUN: 16 mg/dL (ref 6–20)
CALCIUM: 8.7 mg/dL — AB (ref 8.9–10.3)
CO2: 35 mmol/L — ABNORMAL HIGH (ref 22–32)
CREATININE: 0.59 mg/dL (ref 0.44–1.00)
Chloride: 92 mmol/L — ABNORMAL LOW (ref 101–111)
GFR calc non Af Amer: >60 mL/min (ref >60)
Glucose, Bld: 118 mg/dL — ABNORMAL HIGH (ref 65–99)
Potassium: 4.7 mmol/L (ref 3.5–5.1)
SODIUM: 135 mmol/L (ref 135–145)

## 2015-11-17 LAB — GLUCOSE, CAPILLARY: GLUCOSE-CAPILLARY: 86 mg/dL (ref 65–99)

## 2015-11-17 MED ORDER — IPRATROPIUM-ALBUTEROL 0.5-2.5 (3) MG/3ML IN SOLN: 3.0000 mL | Freq: Three times a day (TID) | RESPIRATORY_TRACT | Status: DC

## 2015-11-17 MED ORDER — LEVOFLOXACIN 500 MG PO TABS: 500.0000 mg | ORAL_TABLET | Freq: Every day | ORAL | Status: DC

## 2015-11-17 MED ORDER — DOCUSATE SODIUM 100 MG PO CAPS: 200.0000 mg | ORAL_CAPSULE | Freq: Every day | ORAL | Status: DC

## 2015-11-17 MED ORDER — PREDNISONE 5 MG PO TABS: ORAL_TABLET | ORAL | Status: DC

## 2015-11-17 MED ORDER — TORSEMIDE 20 MG PO TABS: 20.0000 mg | ORAL_TABLET | Freq: Every day | ORAL | Status: DC | PRN

## 2015-11-17 MED ORDER — SIMETHICONE 80 MG PO CHEW: 80.0000 mg | CHEWABLE_TABLET | Freq: Four times a day (QID) | ORAL | Status: DC | PRN

## 2015-11-17 MED ORDER — POLYETHYLENE GLYCOL 3350 17 G PO PACK: 17.0000 g | PACK | Freq: Every day | ORAL | Status: DC

## 2015-11-17 NOTE — Progress Notes (Signed)
OT Cancellation Note  Patient Details Name: Mary [name] MRN: [id number] DOB: 06/27/1976   Cancelled Treatment:    Reason Eval/Treat Not Completed: OT screened, no needs identified, will sign off  Lynne Righi, [place] KungLorraine D 11/17/2015, 10:56 AM

## 2015-11-17 NOTE — Progress Notes (Signed)
Patient discharged.  Educated on discharge medications, follow-up appointments, and discharge instructions.  Educated patient on CHF and COPD, signs and symptoms, and when to call a doctor.  Patient stated understanding and agreed, AVS signed.  Patient gathered belongings.  Prescription given to patient (prednisone).  Other prescriptions called electronically to pharmacy.  IV removed.  No questions or concerns at this time.  Escorted to car via wheelchair with Nurse Tech.

## 2015-11-18 DIAGNOSIS — K5900 Constipation, unspecified: Secondary | ICD-10-CM

## 2015-11-18 NOTE — Discharge Summary (Addendum)
Physician Discharge Summary  Mary [name] ZOX:[id number] DOB: 11-Feb-1976 DOA: 11/13/2015  PCP: Elvina Sidle, MD  Admit date: 11/13/2015 Discharge date: 11/17/2015  Time spent: 50 minutes  Recommendations for Outpatient Follow-up:  1. Recommend weight loss  Discharge Condition: stable    Discharge Diagnoses:  Principal Problem:   Acute on chronic respiratory failure with hypoxia (HCC) Active Problems:   OSA on BiPAP   Cor pulmonale (HCC)   Obesity hypoventilation syndrome (HCC)   COPD exacerbation (HCC)   Constipation   Morbid obesity (HCC)   History of present illness:  Mary [name] is a 39 y.o. female with COPD on 2-3 of O2 at home, OSA/ OHS, Cor pulmonale/ pulm HTN, DVT and PE in the past who presents for dyspnea for past few days which occurs on mild exertion, + cough, no fevers or chills. Found to be hypoxic with use of accessory muscles and wheezing in the ER. Started on BiPAP and admitted to Step down.   Hospital Course:  Principal Problem: Acute on chronic respiratory failure with hypoxia / COPD exacerbation  -wheezing resolved- transitioned to slow prednisone taper- will complete 5 days of Levaquin - weaned down to baseline O2 requirements - flu negative  Active Problems:  Cor pulmonale  -When necessary Lasix has been ordered-appears euvolemic  Abdominal distension/ constipation - abd xray showed a large stool burden- she has had numerous BMs after being given Miralax for 2 days - have discussed the fact that she will need to stay on a stool softener and Miralax at home to prevent this from recurring.   Morbid obesity Body mass index is 37.49 kg/(m^2).    Discharge Exam: Filed Weights   11/14/15 0241  Weight: 93 kg (205 lb 0.4 oz)   Filed Vitals:   11/16/15 2247 11/17/15 0530  BP: 107/69 119/78  Pulse: 61 59  Temp: 97.6 F (36.4 C) 97.3 F (36.3 C)  Resp: 18 18    General: AAO x 3, no distress Cardiovascular: RRR, no murmurs   Respiratory: clear to auscultation bilaterally GI: soft, non-tender, non-distended, bowel sound positive  Discharge Instructions You were cared for by a hospitalist during your hospital stay. If you have any questions about your discharge medications or the care you received while you were in the hospital after you are discharged, you can call the unit and asked to speak with the hospitalist on call if the hospitalist that took care of you is not available. Once you are discharged, your primary care physician will handle any further medical issues. Please note that NO REFILLS for any discharge medications will be authorized once you are discharged, as it is imperative that you return to your primary care physician (or establish a relationship with a primary care physician if you do not have one) for your aftercare needs so that they can reassess your need for medications and monitor your lab values.      Discharge Instructions    Diet - low sodium heart healthy    Complete by:  As directed      Increase activity slowly    Complete by:  As directed             Medication List    TAKE these medications        albuterol 108 (90 BASE) MCG/ACT inhaler  Commonly known as:  PROVENTIL HFA;VENTOLIN HFA  Inhale 2 puffs into the lungs every 6 (six) hours as needed for wheezing or shortness of breath.  docusate sodium 100 MG capsule  Commonly known as:  COLACE  Take 2 capsules (200 mg total) by mouth at bedtime.     Fluticasone-Salmeterol 250-50 MCG/DOSE Aepb  Commonly known as:  ADVAIR  Inhale 1 puff into the lungs every 12 (twelve) hours.     levofloxacin 500 MG tablet  Commonly known as:  LEVAQUIN  Take 1 tablet (500 mg total) by mouth at bedtime.     OXYGEN  Inhale 2 L into the lungs continuous.     polyethylene glycol packet  Commonly known as:  MIRALAX / GLYCOLAX  Take 17 g by mouth daily.     predniSONE 5 MG tablet  Commonly known as:  DELTASONE  Label  & dispense  according to the schedule below. 10 Pills PO for 3 days then, 8 Pills PO for 3 days, 6 Pills PO for 3 days, 4 Pills PO for 3 days, 2 Pills PO for 3 days, 1 Pills PO for 3 days, 1/2 Pill  PO for 3 days then STOP. Total 95 pills.     simethicone 80 MG chewable tablet  Commonly known as:  GAS-X  Chew 1 tablet (80 mg total) by mouth every 6 (six) hours as needed for flatulence.     SPIRIVA HANDIHALER 18 MCG inhalation capsule  Generic drug:  tiotropium  PLACE 1 CAPSULE (18 MCG TOTAL) INTO INHALER AND INHALE DAILY.     torsemide 20 MG tablet  Commonly known as:  DEMADEX  Take 1 tablet (20 mg total) by mouth daily as needed (for weight gain).       No Known Allergies    The results of significant diagnostics from this hospitalization (including imaging, microbiology, ancillary and laboratory) are listed below for reference.    Significant Diagnostic Studies: Dg Chest 2 View  11/13/2015  CLINICAL DATA:  Shortness of breath, low O2 sats. EXAM: CHEST  2 VIEW COMPARISON:  CT chest 08/27/2015 and chest radiograph 12/14/2014. FINDINGS: Trachea is midline. Heart size within normal limits. Mild chronic accentuation of the pulmonary markings. No airspace consolidation or pleural fluid. IMPRESSION: No acute findings. Electronically Signed   By: Leanna BattlesMelinda  Blietz M.D.   On: 11/13/2015 20:26   Dg Abd Portable 1v  11/15/2015  CLINICAL DATA:  One week history of upper abdominal pain and bloating. EXAM: PORTABLE ABDOMEN - 1 VIEW COMPARISON:  PET-CT 01/18/2015.  CT abdomen and pelvis 12/13/2014. FINDINGS: The report for this examination was delayed as the initial image had to be repeated for technical reasons. Bowel gas pattern unremarkable without evidence of obstruction or significant ileus. Moderate stool burden in the colon. No abnormal calcifications. Regional skeleton intact. IMPRESSION: No acute abdominal abnormality.  Moderate colonic stool burden. Electronically Signed   By: Hulan Saashomas  Lawrence M.D.   On:  11/15/2015 13:32    Microbiology: Recent Results (from the past 240 hour(s))  MRSA PCR Screening     Status: None   Collection Time: 11/14/15  2:33 AM  Result Value Ref Range Status   MRSA by PCR NEGATIVE NEGATIVE Final    Comment:        The GeneXpert MRSA Assay (FDA approved for NASAL specimens only), is one component of a comprehensive MRSA colonization surveillance program. It is not intended to diagnose MRSA infection nor to guide or monitor treatment for MRSA infections.      Labs: Basic Metabolic Panel:  Recent Labs Lab 11/13/15 1840 11/14/15 0348 11/17/15 0520  NA 136  --  135  K 4.3  --  4.7  CL 89*  --  92*  CO2 39*  --  35*  GLUCOSE 124*  --  118*  BUN 14  --  16  CREATININE 0.71 0.61 0.59  CALCIUM 8.8*  --  8.7*   Liver Function Tests: No results for input(s): AST, ALT, ALKPHOS, BILITOT, PROT, ALBUMIN in the last 168 hours. No results for input(s): LIPASE, AMYLASE in the last 168 hours. No results for input(s): AMMONIA in the last 168 hours. CBC:  Recent Labs Lab 11/13/15 1840 11/14/15 0348  WBC 9.8 8.2  HGB 15.2* 14.6  HCT 48.6* 47.9*  MCV 105.7* 104.1*  PLT 200 163   Cardiac Enzymes: No results for input(s): CKTOTAL, CKMB, CKMBINDEX, TROPONINI in the last 168 hours. BNP: BNP (last 3 results)  Recent Labs  12/10/14 1827 01/13/15 0049  BNP 48.5 86.5    ProBNP (last 3 results) No results for input(s): PROBNP in the last 8760 hours.  CBG:  Recent Labs Lab 11/16/15 0728 11/16/15 1132 11/16/15 1645 11/16/15 2201 11/17/15 0757  GLUCAP 102* 119* 182* 172* 86       Signed:  Calvert Cantor, MD [facility] 11/17/2015, 9:34 AM

## 2015-12-16 ENCOUNTER — Encounter (HOSPITAL_COMMUNITY): Payer: Self-pay | Admitting: Emergency Medicine

## 2015-12-16 ENCOUNTER — Inpatient Hospital Stay (HOSPITAL_COMMUNITY): Payer: 59

## 2015-12-16 ENCOUNTER — Inpatient Hospital Stay (HOSPITAL_COMMUNITY)
Admission: EM | Admit: 2015-12-16 | Discharge: 2015-12-19 | DRG: 190 | Disposition: A | Discharge status: Home / Self Care | Payer: 59 | Attending: Internal Medicine | Admitting: Internal Medicine

## 2015-12-16 ENCOUNTER — Emergency Department (HOSPITAL_COMMUNITY): Payer: 59

## 2015-12-16 DIAGNOSIS — I272 Other secondary pulmonary hypertension: Secondary | ICD-10-CM | POA: Diagnosis present

## 2015-12-16 DIAGNOSIS — K7460 Unspecified cirrhosis of liver: Secondary | ICD-10-CM | POA: Diagnosis present

## 2015-12-16 DIAGNOSIS — J9621 Acute and chronic respiratory failure with hypoxia: Secondary | ICD-10-CM | POA: Diagnosis present

## 2015-12-16 DIAGNOSIS — G4733 Obstructive sleep apnea (adult) (pediatric): Secondary | ICD-10-CM | POA: Diagnosis present

## 2015-12-16 DIAGNOSIS — J438 Other emphysema: Secondary | ICD-10-CM | POA: Diagnosis not present

## 2015-12-16 DIAGNOSIS — J9622 Acute and chronic respiratory failure with hypercapnia: Secondary | ICD-10-CM | POA: Diagnosis present

## 2015-12-16 DIAGNOSIS — I509 Heart failure, unspecified: Secondary | ICD-10-CM | POA: Diagnosis present

## 2015-12-16 DIAGNOSIS — Z86718 Personal history of other venous thrombosis and embolism: Secondary | ICD-10-CM

## 2015-12-16 DIAGNOSIS — J441 Chronic obstructive pulmonary disease with (acute) exacerbation: Principal | ICD-10-CM | POA: Diagnosis present

## 2015-12-16 DIAGNOSIS — Z8249 Family history of ischemic heart disease and other diseases of the circulatory system: Secondary | ICD-10-CM

## 2015-12-16 DIAGNOSIS — Z86711 Personal history of pulmonary embolism: Secondary | ICD-10-CM

## 2015-12-16 DIAGNOSIS — Z87891 Personal history of nicotine dependence: Secondary | ICD-10-CM

## 2015-12-16 DIAGNOSIS — R062 Wheezing: Secondary | ICD-10-CM

## 2015-12-16 DIAGNOSIS — Z9981 Dependence on supplemental oxygen: Secondary | ICD-10-CM

## 2015-12-16 DIAGNOSIS — R0902 Hypoxemia: Secondary | ICD-10-CM

## 2015-12-16 DIAGNOSIS — Z79899 Other long term (current) drug therapy: Secondary | ICD-10-CM | POA: Diagnosis not present

## 2015-12-16 DIAGNOSIS — I50812 Chronic right heart failure: Secondary | ICD-10-CM

## 2015-12-16 DIAGNOSIS — J449 Chronic obstructive pulmonary disease, unspecified: Secondary | ICD-10-CM | POA: Diagnosis present

## 2015-12-16 DIAGNOSIS — I2781 Cor pulmonale (chronic): Secondary | ICD-10-CM | POA: Diagnosis present

## 2015-12-16 DIAGNOSIS — R0600 Dyspnea, unspecified: Secondary | ICD-10-CM

## 2015-12-16 DIAGNOSIS — J9690 Respiratory failure, unspecified, unspecified whether with hypoxia or hypercapnia: Secondary | ICD-10-CM | POA: Diagnosis present

## 2015-12-16 LAB — COMPREHENSIVE METABOLIC PANEL
ALBUMIN: 3.8 g/dL (ref 3.5–5.0)
ALT: 42 U/L (ref 14–54)
ANION GAP: 6 (ref 5–15)
AST: 53 U/L — AB (ref 15–41)
Alkaline Phosphatase: 101 U/L (ref 38–126)
BUN: 6 mg/dL (ref 6–20)
CHLORIDE: 94 mmol/L — AB (ref 101–111)
CO2: 36 mmol/L — ABNORMAL HIGH (ref 22–32)
Calcium: 9 mg/dL (ref 8.9–10.3)
Creatinine, Ser: 0.48 mg/dL (ref 0.44–1.00)
GFR calc Af Amer: >60 mL/min (ref >60)
Glucose, Bld: 106 mg/dL — ABNORMAL HIGH (ref 65–99)
POTASSIUM: 5.1 mmol/L (ref 3.5–5.1)
Sodium: 136 mmol/L (ref 135–145)
Total Bilirubin: 1.8 mg/dL — ABNORMAL HIGH (ref 0.3–1.2)
Total Protein: 7 g/dL (ref 6.5–8.1)

## 2015-12-16 LAB — CBC
HCT: 45.7 % (ref 36.0–46.0)
Hemoglobin: 14 g/dL (ref 12.0–15.0)
MCH: 32.3 pg (ref 26.0–34.0)
MCHC: 30.6 g/dL (ref 30.0–36.0)
MCV: 105.3 fL — ABNORMAL HIGH (ref 78.0–100.0)
PLATELETS: 154 10*3/uL (ref 150–400)
RBC: 4.34 MIL/uL (ref 3.87–5.11)
RDW: 14.9 % (ref 11.5–15.5)
WBC: 9.4 10*3/uL (ref 4.0–10.5)

## 2015-12-16 MED ORDER — MOMETASONE FURO-FORMOTEROL FUM 100-5 MCG/ACT IN AERO
2.0000 | INHALATION_SPRAY | Freq: Two times a day (BID) | RESPIRATORY_TRACT | Status: DC
  Administered 2015-12-16 – 2015-12-19 (×6): 2 via RESPIRATORY_TRACT
  Filled 2015-12-16: qty 8.8

## 2015-12-16 MED ORDER — ONDANSETRON HCL 4 MG PO TABS: 4.0000 mg | ORAL_TABLET | Freq: Four times a day (QID) | ORAL | Status: DC | PRN

## 2015-12-16 MED ORDER — ALBUTEROL SULFATE (2.5 MG/3ML) 0.083% IN NEBU
5.0000 mg | INHALATION_SOLUTION | Freq: Once | RESPIRATORY_TRACT | Status: AC
  Administered 2015-12-16: 5 mg via RESPIRATORY_TRACT
  Filled 2015-12-16: qty 6

## 2015-12-16 MED ORDER — IPRATROPIUM BROMIDE 0.02 % IN SOLN
0.5000 mg | Freq: Once | RESPIRATORY_TRACT | Status: AC
  Administered 2015-12-16: 0.5 mg via RESPIRATORY_TRACT
  Filled 2015-12-16: qty 2.5

## 2015-12-16 MED ORDER — ACETAMINOPHEN 325 MG PO TABS: 650.0000 mg | ORAL_TABLET | Freq: Four times a day (QID) | ORAL | Status: DC | PRN

## 2015-12-16 MED ORDER — HYDROCODONE-ACETAMINOPHEN 5-325 MG PO TABS: 1.0000 | ORAL_TABLET | ORAL | Status: DC | PRN

## 2015-12-16 MED ORDER — METHYLPREDNISOLONE SODIUM SUCC 125 MG IJ SOLR
80.0000 mg | Freq: Four times a day (QID) | INTRAMUSCULAR | Status: DC
  Administered 2015-12-16 – 2015-12-18 (×7): 80 mg via INTRAVENOUS
  Filled 2015-12-16 (×7): qty 2

## 2015-12-16 MED ORDER — LEVOFLOXACIN IN D5W 500 MG/100ML IV SOLN
500.0000 mg | INTRAVENOUS | Status: DC
  Administered 2015-12-16 – 2015-12-19 (×4): 500 mg via INTRAVENOUS
  Filled 2015-12-16 (×4): qty 100

## 2015-12-16 MED ORDER — SODIUM CHLORIDE 0.9 % IV SOLN: 250.0000 mL | INTRAVENOUS | Status: DC | PRN

## 2015-12-16 MED ORDER — ALBUTEROL SULFATE (2.5 MG/3ML) 0.083% IN NEBU: 5.0000 mg | INHALATION_SOLUTION | RESPIRATORY_TRACT | Status: AC | PRN

## 2015-12-16 MED ORDER — METHYLPREDNISOLONE SODIUM SUCC 125 MG IJ SOLR
80.0000 mg | Freq: Once | INTRAMUSCULAR | Status: AC
  Administered 2015-12-16: 80 mg via INTRAVENOUS
  Filled 2015-12-16: qty 2

## 2015-12-16 MED ORDER — SIMETHICONE 80 MG PO CHEW: 80.0000 mg | CHEWABLE_TABLET | Freq: Four times a day (QID) | ORAL | Status: DC | PRN

## 2015-12-16 MED ORDER — ALBUTEROL SULFATE (2.5 MG/3ML) 0.083% IN NEBU: 2.5000 mg | INHALATION_SOLUTION | RESPIRATORY_TRACT | Status: DC

## 2015-12-16 MED ORDER — POLYETHYLENE GLYCOL 3350 17 G PO PACK
17.0000 g | PACK | Freq: Every day | ORAL | Status: DC
  Administered 2015-12-16 – 2015-12-17 (×2): 17 g via ORAL
  Filled 2015-12-16 (×2): qty 1

## 2015-12-16 MED ORDER — IPRATROPIUM-ALBUTEROL 0.5-2.5 (3) MG/3ML IN SOLN
3.0000 mL | RESPIRATORY_TRACT | Status: DC
Start: 2015-12-16 — End: 2015-12-17
  Administered 2015-12-16 – 2015-12-17 (×2): 3 mL via RESPIRATORY_TRACT
  Filled 2015-12-16: qty 3

## 2015-12-16 MED ORDER — SODIUM CHLORIDE 0.9 % IJ SOLN: 3.0000 mL | INTRAMUSCULAR | Status: DC | PRN

## 2015-12-16 MED ORDER — ENOXAPARIN SODIUM 40 MG/0.4ML ~~LOC~~ SOLN
40.0000 mg | SUBCUTANEOUS | Status: DC
  Administered 2015-12-16 – 2015-12-18 (×3): 40 mg via SUBCUTANEOUS
  Filled 2015-12-16 (×4): qty 0.4

## 2015-12-16 MED ORDER — ACETAMINOPHEN 650 MG RE SUPP: 650.0000 mg | Freq: Four times a day (QID) | RECTAL | Status: DC | PRN

## 2015-12-16 MED ORDER — SODIUM CHLORIDE 0.9 % IJ SOLN
3.0000 mL | Freq: Two times a day (BID) | INTRAMUSCULAR | Status: DC
  Administered 2015-12-16 – 2015-12-19 (×4): 3 mL via INTRAVENOUS

## 2015-12-16 MED ORDER — IOHEXOL 350 MG/ML SOLN
100.0000 mL | Freq: Once | INTRAVENOUS | Status: AC | PRN
  Administered 2015-12-16: 100 mL via INTRAVENOUS

## 2015-12-16 MED ORDER — IPRATROPIUM BROMIDE 0.02 % IN SOLN: 0.5000 mg | RESPIRATORY_TRACT | Status: DC

## 2015-12-16 MED ORDER — SODIUM CHLORIDE 0.9 % IJ SOLN
3.0000 mL | Freq: Two times a day (BID) | INTRAMUSCULAR | Status: DC
  Administered 2015-12-17 – 2015-12-19 (×4): 3 mL via INTRAVENOUS

## 2015-12-16 MED ORDER — ONDANSETRON HCL 4 MG/2ML IJ SOLN: 4.0000 mg | Freq: Four times a day (QID) | INTRAMUSCULAR | Status: DC | PRN

## 2015-12-16 MED ORDER — TORSEMIDE 20 MG PO TABS
20.0000 mg | ORAL_TABLET | Freq: Every day | ORAL | Status: DC | PRN
  Filled 2015-12-16: qty 1

## 2015-12-16 NOTE — ED Provider Notes (Addendum)
CSN: [id number]     Arrival date & time 12/16/15  1329 History   First MD Initiated Contact with Patient 12/16/15 1340     Chief Complaint  Patient presents with  . Shortness of Breath     (Consider location/radiation/quality/duration/timing/severity/associated sxs/prior Treatment) Patient is a 40 y.o. female presenting with shortness of breath. The history is provided by the patient.  Shortness of Breath Associated symptoms: cough and wheezing   Associated symptoms: no abdominal pain, no chest pain, no fever, no headaches, no neck pain, no rash, no sore throat and no vomiting   Patient with hx copd, on home o2 2 liters, states increased sob in the past week.  Sob moderate. Wheezing persists.  States on her normal home o2 her sats have been in 70's and 80's, whereas normally will be in low 90 range.  States went up on own to 3-4 liters this week.  Use neb txs daily. States was on course prednisone from recent hospitalization up until past few days.  States has appt to see Dr Marchelle Gearing tomorrow.  Pt indicates increased non prod cough.  No sore throat or runny nose. Denies increased edema or wt increase.  Pt indicates takes diuretic on prn basis, hasnt taken today. States has adequate of her normal meds. Denies chest pain or discomfort. No leg pain or swelling.      Past Medical History  Diagnosis Date  . COPD (chronic obstructive pulmonary disease) (HCC)     PFTS 03/08/12: fev1 0.58L/1%, FVC 1.18/33%, Ratop 49 and c/w ssevere obstruction. 21% BD response on FVC, RV 219%, DLCO 11/54%  . History of alcoholism (HCC)   . Irregular menses   . DVT of upper extremity (deep vein thrombosis) [facility]) April 2013    right subclavian // Unclear precipitating cause - possibly significant right heart failure, with vascular stasis potentially predisposing to clotting.    . Pulmonary embolus [facility]) April 2013    Precipitating cause unclear. Was treated with coumadin from April-June 2013.  . Moderate to severe  pulmonary hypertension [facility]) April 2013    Cardiac cath on 03/06/12 - 1. Elevated pulmonary artery pressures, right sided filling pressures.,  2. PA: 64/45 (mean 53)    . Hepatic cirrhosis (HCC)     Questionable history of - Noted on CT abdomen (03/2012) - thought to be due to vascular congestion from right heart failure +/- patient's history of alcohol abuse  . Axillary adenopathy     right axillary adenopathy noted on CT chest (03/06/2012)  . Periodontal disease   . Chronic right-sided CHF (congestive heart failure) (HCC) 03/23/2012    with cor pulmonale. Last RHC 03/2012  . Pneumonia ~ 1985; 01/2011  . History of chronic bronchitis   . Exertional shortness of breath   . On home oxygen therapy     "2-3 L 24/7" (05/03/2013)  . OSA (obstructive sleep apnea)     wears noctural BiPAP (05/03/2013)  . Migraine     "~ 1/yr" (05/03/2013)   Past Surgical History  Procedure Laterality Date  . Cardiac surgery  1977-07-02    "my heart was backwards" (05/03/2013)  . Lung surgery  Mar 18, 1977  . Appendectomy  05/03/2013  . Cardiac catheterization  03/2012  . Laparoscopic appendectomy N/A 05/03/2013    Procedure: APPENDECTOMY LAPAROSCOPIC;  Surgeon: Cherylynn Ridges, MD;  Location: [facility] OR;  Service: General;  Laterality: N/A;  . Right heart catheterization N/A 03/07/2012    Procedure: RIGHT HEART CATH;  Surgeon: Kathleene Hazel,  MD;  Location: [facility] CATH LAB;  Service: Cardiovascular;  Laterality: N/A;   Family History  Problem Relation Age of Onset  . Hypertension Father   . Heart disease Father     CHF; died age 39   . Alcohol abuse Father   . Other Brother     died age 39 y.o overdose   Social History  Substance Use Topics  . Smoking status: Former Smoker -- 1.00 packs/day for 25 years    Types: Cigarettes    Quit date: 02/18/2012  . Smokeless tobacco: Never Used  . Alcohol Use: No     Comment: previously drinking 1 pint 3-4 days a week for 2002-2013, but in 2014, no longer drinking   OB  History    No data available     Review of Systems  Constitutional: Negative for fever and chills.  HENT: Negative for sore throat.   Eyes: Negative for redness.  Respiratory: Positive for cough, shortness of breath and wheezing.   Cardiovascular: Negative for chest pain.  Gastrointestinal: Negative for vomiting, abdominal pain and diarrhea.  Genitourinary: Negative for flank pain.  Musculoskeletal: Negative for back pain and neck pain.  Skin: Negative for rash.  Neurological: Negative for headaches.  Hematological: Does not bruise/bleed easily.  Psychiatric/Behavioral: Negative for confusion.      Allergies  Review of patient's allergies indicates no known allergies.  Home Medications   Prior to Admission medications   Medication Sig Start Date End Date Taking? Authorizing Provider  albuterol (PROVENTIL HFA;VENTOLIN HFA) 108 (90 BASE) MCG/ACT inhaler Inhale 2 puffs into the lungs every 6 (six) hours as needed for wheezing or shortness of breath. Patient taking differently: Inhale 2 puffs into the lungs 4 (four) times daily.  09/06/14   Kalman ShanMurali Ramaswamy, MD  docusate sodium (COLACE) 100 MG capsule Take 2 capsules (200 mg total) by mouth at bedtime. 11/17/15   Calvert CantorSaima Rizwan, MD  Fluticasone-Salmeterol (ADVAIR) 250-50 MCG/DOSE AEPB Inhale 1 puff into the lungs every 12 (twelve) hours. 09/27/14   Kalman ShanMurali Ramaswamy, MD  levofloxacin (LEVAQUIN) 500 MG tablet Take 1 tablet (500 mg total) by mouth at bedtime. 11/17/15   Calvert CantorSaima Rizwan, MD  OXYGEN Inhale 2 L into the lungs continuous.    Historical Provider, MD  polyethylene glycol (MIRALAX / GLYCOLAX) packet Take 17 g by mouth daily. 11/17/15   Calvert CantorSaima Rizwan, MD  predniSONE (DELTASONE) 5 MG tablet Label  & dispense according to the schedule below. 10 Pills PO for 3 days then, 8 Pills PO for 3 days, 6 Pills PO for 3 days, 4 Pills PO for 3 days, 2 Pills PO for 3 days, 1 Pills PO for 3 days, 1/2 Pill  PO for 3 days then STOP. Total 95 pills.  11/17/15   Calvert CantorSaima Rizwan, MD  simethicone (GAS-X) 80 MG chewable tablet Chew 1 tablet (80 mg total) by mouth every 6 (six) hours as needed for flatulence. 11/17/15   Calvert CantorSaima Rizwan, MD  SPIRIVA HANDIHALER 18 MCG inhalation capsule PLACE 1 CAPSULE (18 MCG TOTAL) INTO INHALER AND INHALE DAILY. 10/06/15   Kalman ShanMurali Ramaswamy, MD  torsemide (DEMADEX) 20 MG tablet Take 1 tablet (20 mg total) by mouth daily as needed (for weight gain). 11/17/15   Calvert CantorSaima Rizwan, MD   SpO2 91% Physical Exam  Constitutional: She appears well-developed and well-nourished. No distress.  HENT:  Mouth/Throat: Oropharynx is clear and moist.  Eyes: Conjunctivae are normal. No scleral icterus.  Neck: Neck supple. No tracheal deviation present.  Cardiovascular: Normal rate,  regular rhythm, normal heart sounds and intact distal pulses.  Exam reveals no gallop and no friction rub.   No murmur heard. Pulmonary/Chest: Effort normal. No respiratory distress. She has wheezes.  Markedly decreased air movement bilaterally.  Wheezing.   Abdominal: Soft. Normal appearance and bowel sounds are normal. She exhibits no distension. There is no tenderness.  Musculoskeletal: She exhibits no edema or tenderness.  Neurological: She is alert.  Skin: Skin is warm and dry. No rash noted. She is not diaphoretic.  Psychiatric: She has a normal mood and affect.  Nursing note and vitals reviewed.   ED Course  Procedures (including critical care time) Labs Review  Dg Chest 2 View  12/16/2015  CLINICAL DATA: Shortness of breath with exertion.  History of COPD. EXAM: CHEST - 2 VIEW COMPARISON:  11/13/2015 FINDINGS: Stable chronic lung disease with bibasilar scarring and atelectasis. There is no evidence of pulmonary edema, consolidation, pneumothorax, nodule or pleural fluid. The heart size is stable. The bony thorax shows no abnormalities. IMPRESSION: Stable chronic lung disease.  No acute findings. Electronically Signed   By: Irish Lack M.D.   On:  12/16/2015 14:12      I have personally reviewed and evaluated these images as part of my medical decision-making.    MDM   Albuterol and atrovent neb.  Solumedrol iv.  Reviewed nursing notes and prior charts for additional history.   Recheck, persistent wheezing.  Additional albuterol and atrovent neb.  With standing, or moving in room, pts o2 sats on her normal 2 liters are in low 80's.  Pt continues to have wheezing and feeling that increased wheezing/sob, worse than baseline.  Given copd exacerbation, worsening hypoxia, will admit.   Discussed pt with Dr Sunnie Nielsen - requests tele admit, temp orders.      Cathren Laine, MD 12/16/15 [phone]

## 2015-12-16 NOTE — ED Notes (Signed)
Per [facility] pt from home co shortness of breath only exertional , since last night. Hx COPD. Home O2  2 L. Pt alert and oriented x 4, able to speak full sentences per EMS. Denies chest pain at this time.

## 2015-12-16 NOTE — H&P (Signed)
[facility] History and Physical  INFANT DOANE ZOX:[id number] DOB: 25-Sep-1976 DOA: 12/16/2015  Referring physician: Dr Leo Rod  PCP: Elvina Sidle, MD   Chief Complaint: SOB  HPI: Mary [name] is a 40 y.o. female With PMH sigh=nificant for COPD, on 2 -3 L of oxygen, PE, CHF, who presents with worsening SOB on exertion and rest. Report mild cough. Dyspnea getting worse over last few days. Has been in the hospital 3 times over last 3 months. No chest pain. Denies fluids retention. No fever, no runny nose, no muscle pain.   Chest x ray; stable lung diseases.   Review of Systems:  Negative, except as per hpi.   Past Medical History  Diagnosis Date  . COPD (chronic obstructive pulmonary disease) (HCC)     PFTS 03/08/12: fev1 0.58L/1%, FVC 1.18/33%, Ratop 49 and c/w ssevere obstruction. 21% BD response on FVC, RV 219%, DLCO 11/54%  . History of alcoholism (HCC)   . Irregular menses   . DVT of upper extremity (deep vein thrombosis) [facility]) April 2013    right subclavian // Unclear precipitating cause - possibly significant right heart failure, with vascular stasis potentially predisposing to clotting.    . Pulmonary embolus [facility]) April 2013    Precipitating cause unclear. Was treated with coumadin from April-June 2013.  . Moderate to severe pulmonary hypertension [facility]) April 2013    Cardiac cath on 03/06/12 - 1. Elevated pulmonary artery pressures, right sided filling pressures.,  2. PA: 64/45 (mean 53)    . Hepatic cirrhosis (HCC)     Questionable history of - Noted on CT abdomen (03/2012) - thought to be due to vascular congestion from right heart failure +/- patient's history of alcohol abuse  . Axillary adenopathy     right axillary adenopathy noted on CT chest (03/06/2012)  . Periodontal disease   . Chronic right-sided CHF (congestive heart failure) (HCC) 03/23/2012    with cor pulmonale. Last RHC 03/2012  . Pneumonia ~ 1985; 01/2011  . History of chronic bronchitis   .  Exertional shortness of breath   . On home oxygen therapy     "2-3 L 24/7" (05/03/2013)  . OSA (obstructive sleep apnea)     wears noctural BiPAP (05/03/2013)  . Migraine     "~ 1/yr" (05/03/2013)   Past Surgical History  Procedure Laterality Date  . Cardiac surgery  November 20, 1977    "my heart was backwards" (05/03/2013)  . Lung surgery  June 22, 1977  . Appendectomy  05/03/2013  . Cardiac catheterization  03/2012  . Laparoscopic appendectomy N/A 05/03/2013    Procedure: APPENDECTOMY LAPAROSCOPIC;  Surgeon: Cherylynn Ridges, MD;  Location: [facility] OR;  Service: General;  Laterality: N/A;  . Right heart catheterization N/A 03/07/2012    Procedure: RIGHT HEART CATH;  Surgeon: Kathleene Hazel, MD;  Location[facility] CATH LAB;  Service: Cardiovascular;  Laterality: N/A;   Social History:  reports that she quit smoking about 3 years ago. Her smoking use included Cigarettes. She has a 25 pack-year smoking history. She has never used smokeless tobacco. She reports that she does not drink alcohol or use illicit drugs.  No Known Allergies  Family History  Problem Relation Age of Onset  . Hypertension Father   . Heart disease Father     CHF; died age 41   . Alcohol abuse Father   . Other Brother     died age 75 y.o overdose    Prior to Admission medications   Medication Sig Start Date  End Date Taking? Authorizing Provider  albuterol (PROVENTIL HFA;VENTOLIN HFA) 108 (90 BASE) MCG/ACT inhaler Inhale 2 puffs into the lungs every 6 (six) hours as needed for wheezing or shortness of breath. Patient taking differently: Inhale 2 puffs into the lungs 4 (four) times daily.  09/06/14  Yes Kalman Shan, MD  docusate sodium (COLACE) 100 MG capsule Take 2 capsules (200 mg total) by mouth at bedtime. 11/17/15  Yes Calvert Cantor, MD  Fluticasone-Salmeterol (ADVAIR) 250-50 MCG/DOSE AEPB Inhale 1 puff into the lungs every 12 (twelve) hours. 09/27/14  Yes Kalman Shan, MD  OXYGEN Inhale 2 L into the lungs continuous.    Yes Historical Provider, MD  polyethylene glycol (MIRALAX / GLYCOLAX) packet Take 17 g by mouth daily. 11/17/15  Yes Calvert Cantor, MD  simethicone (GAS-X) 80 MG chewable tablet Chew 1 tablet (80 mg total) by mouth every 6 (six) hours as needed for flatulence. 11/17/15  Yes Calvert Cantor, MD  SPIRIVA HANDIHALER 18 MCG inhalation capsule PLACE 1 CAPSULE (18 MCG TOTAL) INTO INHALER AND INHALE DAILY. 10/06/15  Yes Kalman Shan, MD  torsemide (DEMADEX) 20 MG tablet Take 1 tablet (20 mg total) by mouth daily as needed (for weight gain). 11/17/15  Yes Calvert Cantor, MD  levofloxacin (LEVAQUIN) 500 MG tablet Take 1 tablet (500 mg total) by mouth at bedtime. Patient not taking: Reported on 12/16/2015 11/17/15   Calvert Cantor, MD  predniSONE (DELTASONE) 5 MG tablet Label  & dispense according to the schedule below. 10 Pills PO for 3 days then, 8 Pills PO for 3 days, 6 Pills PO for 3 days, 4 Pills PO for 3 days, 2 Pills PO for 3 days, 1 Pills PO for 3 days, 1/2 Pill  PO for 3 days then STOP. Total 95 pills. Patient not taking: Reported on 12/16/2015 11/17/15   Calvert Cantor, MD   Physical Exam: Filed Vitals:   12/16/15 1333 12/16/15 1342 12/16/15 1644  BP:  126/87 125/77  Pulse:  93 95  Temp:  98.4 F (36.9 C)   TempSrc:  Oral   Resp:  18 19  SpO2: 91% 93% 90%    Wt Readings from Last 3 Encounters:  11/14/15 93 kg (205 lb 0.4 oz)  09/16/15 91.354 kg (201 lb 6.4 oz)  07/25/15 87.998 kg (194 lb)    General:  Appears calm and comfortable Eyes: PERRL, normal lids, irises & conjunctiva ENT: grossly normal hearing, lips & tongue Neck: no LAD, masses or thyromegaly Cardiovascular: RRR, no m/r/g. No LE edema. Telemetry: SR, no arrhythmias  Respiratory:Normal respiratory effort. Distant breath sounds, decreases.  Abdomen: soft, ntnd Skin: no rash or induration seen on limited exam Musculoskeletal: grossly normal tone BUE/BLE Psychiatric: grossly normal mood and affect, speech fluent and  appropriate Neurologic: grossly non-focal.          Labs on Admission:  Basic Metabolic Panel:  Recent Labs Lab 12/16/15 1503  NA 136  K 5.1  CL 94*  CO2 36*  GLUCOSE 106*  BUN 6  CREATININE 0.48  CALCIUM 9.0   Liver Function Tests:  Recent Labs Lab 12/16/15 1503  AST 53*  ALT 42  ALKPHOS 101  BILITOT 1.8*  PROT 7.0  ALBUMIN 3.8   No results for input(s): LIPASE, AMYLASE in the last 168 hours. No results for input(s): AMMONIA in the last 168 hours. CBC:  Recent Labs Lab 12/16/15 1503  WBC 9.4  HGB 14.0  HCT 45.7  MCV 105.3*  PLT 154   Cardiac Enzymes: No results  for input(s): CKTOTAL, CKMB, CKMBINDEX, TROPONINI in the last 168 hours.  BNP (last 3 results)  Recent Labs  01/13/15 0049  BNP 86.5    ProBNP (last 3 results) No results for input(s): PROBNP in the last 8760 hours.  CBG: No results for input(s): GLUCAP in the last 168 hours.  Radiological Exams on Admission: Dg Chest 2 View  12/16/2015  CLINICAL DATA: Shortness of breath with exertion.  History of COPD. EXAM: CHEST - 2 VIEW COMPARISON:  11/13/2015 FINDINGS: Stable chronic lung disease with bibasilar scarring and atelectasis. There is no evidence of pulmonary edema, consolidation, pneumothorax, nodule or pleural fluid. The heart size is stable. The bony thorax shows no abnormalities. IMPRESSION: Stable chronic lung disease.  No acute findings. Electronically Signed   By: Irish LackGlenn  Yamagata M.D.   On: 12/16/2015 14:12    EKG: none available.   Assessment/Plan Active Problems:   COPD (chronic obstructive pulmonary disease) (HCC)  1-Acute Hypoxic on chronic respiratory failure ; secondary to COPD, Admit to telemetry.  Finished prednisone taper 3 days ago.  Treat for COPD exacerbation.  Resume home dose torsemide.  Schedule nebulizer treatments.  IV solumedrol.  IV Levaquin.  Check CT angio.   2-OSA; BIPAP/   Code Status: presume full code.  DVT Prophylaxis: Lovenox.  Family  Communication: husband at bedside.  Disposition Plan: expect 2 to 3 days inpatient.   Time spent: 75 minutes.   Hartley Barefootegalado, Amiria Orrison A [facility] Pager [phone]

## 2015-12-16 NOTE — ED Notes (Signed)
Bed: [id number] Expected date:  Expected time:  Means of arrival:  Comments: Ems- COPD SOB

## 2015-12-17 ENCOUNTER — Telehealth: Payer: Self-pay | Admitting: Internal Medicine

## 2015-12-17 ENCOUNTER — Ambulatory Visit: Payer: 59 | Admitting: Internal Medicine

## 2015-12-17 DIAGNOSIS — J438 Other emphysema: Secondary | ICD-10-CM

## 2015-12-17 LAB — MRSA PCR SCREENING: MRSA by PCR: NEGATIVE

## 2015-12-17 MED ORDER — IPRATROPIUM-ALBUTEROL 0.5-2.5 (3) MG/3ML IN SOLN
3.0000 mL | Freq: Four times a day (QID) | RESPIRATORY_TRACT | Status: DC
  Administered 2015-12-17 – 2015-12-19 (×10): 3 mL via RESPIRATORY_TRACT
  Filled 2015-12-17 (×10): qty 3

## 2015-12-17 NOTE — Telephone Encounter (Signed)
Called husband and made him aware of MR's recs. Pt's husband voiced understanding and had no further questions. Nothing further needed at this time.

## 2015-12-17 NOTE — Telephone Encounter (Signed)
Let husband know I spoke to Dr Roda ShuttersXu hospitalist and depending on condition 12/18/15 [facility]

## 2015-12-17 NOTE — Telephone Encounter (Signed)
Called and spoke with the patient's husband. He states that [phone]. He states she was c/o SOB. He stated he felt like it was due to issues with her CPAP and oxygen machine. When I asked for further detail, he stated [facility]. He wanted to make MR aware that patient was admitted. I explained I would send the message to MR. Patient's husband voiced understanding and had no further questions.   [facility]

## 2015-12-17 NOTE — Progress Notes (Signed)
PROGRESS NOTE  Jana HakimMichelle L Luhn ION:[id number] DOB: 08/30/1976 DOA: 12/16/2015 PCP: Elvina SidleLAUENSTEIN,KURT, MD  HPI/Recap of past 24 hours:  Reported feeling slightly better  Assessment/Plan: Active Problems:   COPD (chronic obstructive pulmonary disease) (HCC)   Respiratory failure (HCC)  1-Acute Hypoxic on chronic respiratory failure ; secondary to COPD, (per patient this is the third admission this winter) Admitted to telemetry.  Finished prednisone taper 3 days ago prior to admission Treat for COPD exacerbation.  Resume home dose torsemide.  Schedule nebulizer treatments.  IV solumedrol.  IV Levaquin.  CT angio no PE   2-OSA; BIPAP  Code Status: presume full code.  DVT Prophylaxis: Lovenox.  Family Communication: patient.   Disposition Plan: peding   Consultants:  Will call pulm in am (primary pulmonologist Dr Efraim Kaufmannamsawamy called and recommending calling pulm consult in am)  Procedures:  none  Antibiotics:  levaquin   Objective: BP 120/77 mmHg  Pulse 97  Temp(Src) 98.4 F (36.9 C) (Oral)  Resp 18  Ht 5' 2.5" (1.588 m)  Wt 205 lb 7.5 oz (93.2 kg)  BMI 36.96 kg/m2  SpO2 92%  LMP 11/25/2015  Intake/Output Summary (Last 24 hours) at 12/17/15 1633 Last data filed at 12/16/15 2356  Gross per 24 hour  Intake    100 ml  Output    550 ml  Net   -450 ml   Filed Weights   12/16/15 1834 12/17/15 0500  Weight: 203 lb 9.6 oz (92.352 kg) 205 lb 7.5 oz (93.2 kg)    Exam:   General:  NAD, able to talk in full sentences   Cardiovascular: RRR  Respiratory: over all diminished, no obvious wheezing currently  Abdomen: Soft/ND/NT, positive BS  Musculoskeletal: No Edema  Neuro: aaox3  Data Reviewed: Basic Metabolic Panel:  Recent Labs Lab 12/16/15 1503  NA 136  K 5.1  CL 94*  CO2 36*  GLUCOSE 106*  BUN 6  CREATININE 0.48  CALCIUM 9.0   Liver Function Tests:  Recent Labs Lab 12/16/15 1503  AST 53*  ALT 42  ALKPHOS 101  BILITOT  1.8*  PROT 7.0  ALBUMIN 3.8   No results for input(s): LIPASE, AMYLASE in the last 168 hours. No results for input(s): AMMONIA in the last 168 hours. CBC:  Recent Labs Lab 12/16/15 1503  WBC 9.4  HGB 14.0  HCT 45.7  MCV 105.3*  PLT 154   Cardiac Enzymes:   No results for input(s): CKTOTAL, CKMB, CKMBINDEX, TROPONINI in the last 168 hours. BNP (last 3 results)  Recent Labs  01/13/15 0049  BNP 86.5    ProBNP (last 3 results) No results for input(s): PROBNP in the last 8760 hours.  CBG: No results for input(s): GLUCAP in the last 168 hours.  No results found for this or any previous visit (from the past 240 hour(s)).   Studies: Ct Angio Chest Pe W/cm &/or Wo Cm  12/16/2015  CLINICAL DATA:  Shortness of breath. EXAM: CT ANGIOGRAPHY CHEST WITH CONTRAST TECHNIQUE: Multidetector CT imaging of the chest was performed using the standard protocol during bolus administration of intravenous contrast. Multiplanar CT image reconstructions and MIPs were obtained to evaluate the vascular anatomy. CONTRAST:  100mL OMNIPAQUE IOHEXOL 350 MG/ML SOLN COMPARISON:  CT scan of August 27, 2015. FINDINGS: No pneumothorax or pleural effusion is noted. Stable mosaic pattern is seen throughout the lungs. Stable scarring is seen in the inferior portion of the lingular segment of left upper lobe. There is no evidence of pulmonary embolus. There  is no evidence of thoracic aortic dissection or aneurysm. No significant abnormality seen within the visualized portion of upper abdomen. No mediastinal mass or adenopathy is noted. No significant osseous abnormality is noted. Review of the MIP images confirms the above findings. IMPRESSION: No evidence of pulmonary embolus. No acute cardiopulmonary abnormality seen. Electronically Signed   By: Lupita Raider, M.D.   On: 12/16/2015 22:06    Scheduled Meds: . enoxaparin (LOVENOX) injection  40 mg Subcutaneous Q24H  . ipratropium-albuterol  3 mL Nebulization Q6H    . levofloxacin (LEVAQUIN) IV  500 mg Intravenous Q24H  . methylPREDNISolone (SOLU-MEDROL) injection  80 mg Intravenous Q6H  . mometasone-formoterol  2 puff Inhalation BID  . polyethylene glycol  17 g Oral Daily  . sodium chloride  3 mL Intravenous Q12H  . sodium chloride  3 mL Intravenous Q12H    Continuous Infusions:    Time spent:  Meli Faley MD, PhD  [facility] Pager [phone]. If 7PM-7AM, please contact night-coverage at [email or web address], password [facility] 12/17/2015, 4:33 PM  LOS: 1 day

## 2015-12-18 DIAGNOSIS — J9622 Acute and chronic respiratory failure with hypercapnia: Secondary | ICD-10-CM

## 2015-12-18 DIAGNOSIS — J449 Chronic obstructive pulmonary disease, unspecified: Secondary | ICD-10-CM

## 2015-12-18 DIAGNOSIS — G4733 Obstructive sleep apnea (adult) (pediatric): Secondary | ICD-10-CM

## 2015-12-18 DIAGNOSIS — J9621 Acute and chronic respiratory failure with hypoxia: Secondary | ICD-10-CM

## 2015-12-18 LAB — COMPREHENSIVE METABOLIC PANEL
ALK PHOS: 80 U/L (ref 38–126)
ALT: 27 U/L (ref 14–54)
AST: 18 U/L (ref 15–41)
Albumin: 3.9 g/dL (ref 3.5–5.0)
Anion gap: 9 (ref 5–15)
BILIRUBIN TOTAL: 0.3 mg/dL (ref 0.3–1.2)
BUN: 15 mg/dL (ref 6–20)
CALCIUM: 9.7 mg/dL (ref 8.9–10.3)
CO2: 35 mmol/L — ABNORMAL HIGH (ref 22–32)
CREATININE: 0.69 mg/dL (ref 0.44–1.00)
Chloride: 96 mmol/L — ABNORMAL LOW (ref 101–111)
Glucose, Bld: 305 mg/dL — ABNORMAL HIGH (ref 65–99)
Potassium: 4.6 mmol/L (ref 3.5–5.1)
Sodium: 140 mmol/L (ref 135–145)
TOTAL PROTEIN: 6.9 g/dL (ref 6.5–8.1)

## 2015-12-18 LAB — EXPECTORATED SPUTUM ASSESSMENT W GRAM STAIN, RFLX TO RESP C

## 2015-12-18 LAB — MAGNESIUM: MAGNESIUM: 2.3 mg/dL (ref 1.7–2.4)

## 2015-12-18 LAB — EXPECTORATED SPUTUM ASSESSMENT W REFEX TO RESP CULTURE

## 2015-12-18 MED ORDER — POLYVINYL ALCOHOL 1.4 % OP SOLN
1.0000 [drp] | Freq: Three times a day (TID) | OPHTHALMIC | Status: DC | PRN
  Administered 2015-12-19: 1 [drp] via OPHTHALMIC
  Filled 2015-12-18: qty 15

## 2015-12-18 MED ORDER — FUROSEMIDE 10 MG/ML IJ SOLN
40.0000 mg | Freq: Every day | INTRAMUSCULAR | Status: DC
  Administered 2015-12-18 – 2015-12-19 (×2): 40 mg via INTRAVENOUS
  Filled 2015-12-18 (×2): qty 4

## 2015-12-18 MED ORDER — METHYLPREDNISOLONE SODIUM SUCC 125 MG IJ SOLR
80.0000 mg | Freq: Two times a day (BID) | INTRAMUSCULAR | Status: DC
  Administered 2015-12-18 – 2015-12-19 (×2): 80 mg via INTRAVENOUS
  Filled 2015-12-18 (×2): qty 2

## 2015-12-18 NOTE — Consult Note (Addendum)
Name: Mary [name] MRN: [id number] DOB: 1976-05-05    ADMISSION DATE:  12/16/2015 CONSULTATION DATE:  12/18/2015   REFERRING MD :  Mary [name]  CHIEF COMPLAINT:  Worsening oxygen levels    HISTORY OF PRESENT ILLNESS:  This is the third hospital visit in the last 6 weeks for this 40 year old ex-smoker with chronic respiratory failure this has been attributed to COPD-lung function testing does show significant airway obstruction. She also has a history of congenital heart surgery in the past. Right heart cath in 2013 showed severe pulmonary hypertension with normal wedge pressure and a cardiac index of 2.76. Surprisingly echo in 2015 showed normal right heart function. She has been maintained on 2 L of oxygen for the past several years and nocturnal BiPAP. She reports good compliance with her BiPAP. She was deemed not to be a transplant candidate due to difficult social situation She was hospitalized around Christmastime at [facility] about 3 hours away. Over the past few days she has noted drop in her oxygen levels with minimal ambulation. She complains of generalized weakness, no clear respiratory distress. She denies URI symptoms, sick contacts or wheezing. There is no pedal edema, she has torsemide which she takes on an as-needed basis She is maintained on Spiriva and Advair reports compliance Her portable oxygen refill tank had given out and hence she had to call the ambulance for transport.  CT Mary [name] and did not show any evidence of pulmonary embolism She's been treated with steroids and bronchodilators. She continues to desaturate on walking to the bathroom on 2 L     Significant tests/ events  Alpha 1 is MM - checked May 2013. per hx her dad had copd.  - PFTS 03/08/12: fev1 0.58L/1%, FVC 1.18/33%, Ratop 49 and c/w ssevere obstruction. 21% BD response on FVC, RV 219%, DLCO 11/54% - PFT 04/10/12: fev1 0.54L/18%, 31% BD response, RATio 49, TL 88%, RV 204$, DLCO 30%,  - Abg  baseline MAy 2013: 7.43/50/81  PAST MEDICAL HISTORY :   has a past medical history of COPD (chronic obstructive pulmonary disease) (HCC); History of alcoholism (HCC); Irregular menses; DVT of upper extremity (deep vein thrombosis) [facility]) (April 2013); Pulmonary embolus [facility]) (April 2013); Moderate to severe pulmonary hypertension [facility]) (April 2013); Hepatic cirrhosis (HCC); Axillary adenopathy; Periodontal disease; Chronic right-sided CHF (congestive heart failure) (HCC) (03/23/2012); Pneumonia (~ 1985; 01/2011); History of chronic bronchitis; Exertional shortness of breath; On home oxygen therapy; OSA (obstructive sleep apnea); and Migraine.  has past surgical history that includes Cardiac surgery (11-Jun-1976); Lung surgery ([birth year]-23[birth year]); Appendectomy (05/03/2013); Cardiac catheterization (03/2012); laparoscopic appendectomy (N/A, 05/03/2013); and right heart catheterization (N/A, 03/07/2012). Prior to Admission medications   Medication Sig Start Date End Date Taking? Authorizing Provider  albuterol (PROVENTIL HFA;VENTOLIN HFA) 108 (90 BASE) MCG/ACT inhaler Inhale 2 puffs into the lungs every 6 (six) hours as needed for wheezing or shortness of breath. Patient taking differently: Inhale 2 puffs into the lungs 4 (four) times daily.  09/06/14  Yes Mary Shan, MD  docusate sodium (COLACE) 100 MG capsule Take 2 capsules (200 mg total) by mouth at bedtime. 11/17/15  Yes Mary Cantor, MD  Fluticasone-Salmeterol (ADVAIR) 250-50 MCG/DOSE AEPB Inhale 1 puff into the lungs every 12 (twelve) hours. 09/27/14  Yes Mary Shan, MD  OXYGEN Inhale 2 L into the lungs continuous.   Yes Historical Provider, MD  polyethylene glycol (MIRALAX / GLYCOLAX) packet Take 17 g by mouth daily. 11/17/15  Yes Mary Cantor, MD  simethicone (GAS-X) 80 MG  chewable tablet Chew 1 tablet (80 mg total) by mouth every 6 (six) hours as needed for flatulence. 11/17/15  Yes Mary Cantor, MD  SPIRIVA HANDIHALER 18 MCG inhalation capsule PLACE  1 CAPSULE (18 MCG TOTAL) INTO INHALER AND INHALE DAILY. 10/06/15  Yes Mary Shan, MD  torsemide (DEMADEX) 20 MG tablet Take 1 tablet (20 mg total) by mouth daily as needed (for weight gain). 11/17/15  Yes Mary Cantor, MD  levofloxacin (LEVAQUIN) 500 MG tablet Take 1 tablet (500 mg total) by mouth at bedtime. Patient not taking: Reported on 12/16/2015 11/17/15   Mary Cantor, MD  predniSONE (DELTASONE) 5 MG tablet Label  & dispense according to the schedule below. 10 Pills PO for 3 days then, 8 Pills PO for 3 days, 6 Pills PO for 3 days, 4 Pills PO for 3 days, 2 Pills PO for 3 days, 1 Pills PO for 3 days, 1/2 Pill  PO for 3 days then STOP. Total 95 pills. Patient not taking: Reported on 12/16/2015 11/17/15   Mary Cantor, MD   No Known Allergies  FAMILY HISTORY:  family history includes Alcohol abuse in her father; Heart disease in her father; Hypertension in her father; Other in her brother. SOCIAL HISTORY:  reports that she quit smoking about 3 years ago. Her smoking use included Cigarettes. She has a 25 pack-year smoking history. She has never used smokeless tobacco. She reports that she does not drink alcohol or use illicit drugs.  REVIEW OF SYSTEMS:   Constitutional: Negative for fever, chills, weight loss, malaise/fatigue and diaphoresis.  HENT: Negative for hearing loss, ear pain, nosebleeds, congestion, sore throat, neck pain, tinnitus and ear discharge.   Eyes: Negative for blurred vision, double vision, photophobia, pain, discharge and redness.  Respiratory: Negative for cough, hemoptysis, sputum production, shortness of breath, wheezing and stridor.   Cardiovascular: Negative for chest pain, palpitations, orthopnea, claudication, leg swelling and PND.  Gastrointestinal: Negative for heartburn, nausea, vomiting, abdominal pain, diarrhea, constipation, blood in stool and melena.  Genitourinary: Negative for dysuria, urgency, frequency, hematuria and flank pain.  Musculoskeletal:  Negative for myalgias, back pain, joint pain and falls.  Skin: Negative for itching and rash.  Neurological: Negative for dizziness, tingling, tremors, sensory change, speech change, focal weakness, seizures, loss of consciousness, weakness and headaches.  Endo/Heme/Allergies: Negative for environmental allergies and polydipsia. Does not bruise/bleed easily.  SUBJECTIVE:   VITAL SIGNS: Temp:  [98 F (36.7 C)-98.4 F (36.9 C)] 98.2 F (36.8 C) (01/12 0520) Pulse Rate:  [66-98] 66 (01/12 0520) Resp:  [16-18] 16 (01/12 0520) BP: (103-126)/(62-80) 103/62 mmHg (01/12 0520) SpO2:  [91 %-95 %] 94 % (01/12 0805) Weight:  [205 lb 4 oz (93.1 kg)] 205 lb 4 oz (93.1 kg) (01/12 0520)  PHYSICAL EXAMINATION: Gen. Pleasant, obese, in no distress, normal affect ENT - no lesions, no post nasal drip, class 2-3 airway Neck: No JVD, no thyromegaly, no carotid bruits Lungs: no use of accessory muscles, no dullness to percussion, decreased without rales or rhonchi  Cardiovascular: Rhythm regular, heart sounds  normal, no murmurs or gallops, no peripheral edema Abdomen: soft and non-tender, no hepatosplenomegaly, BS normal. Musculoskeletal: No deformities, no cyanosis or clubbing Neuro:  alert, non focal, no tremors    Recent Labs Lab 12/16/15 1503 12/18/15 0525  NA 136 140  K 5.1 4.6  CL 94* 96*  CO2 36* 35*  BUN 6 15  CREATININE 0.48 0.69  GLUCOSE 106* 305*    Recent Labs Lab 12/16/15 1503  HGB  14.0  HCT 45.7  WBC 9.4  PLT 154   Dg Chest 2 View  12/16/2015  CLINICAL DATA: Shortness of breath with exertion.  History of COPD. EXAM: CHEST - 2 VIEW COMPARISON:  11/13/2015 FINDINGS: Stable chronic lung disease with bibasilar scarring and atelectasis. There is no evidence of pulmonary edema, consolidation, pneumothorax, nodule or pleural fluid. The heart size is stable. The bony thorax shows no abnormalities. IMPRESSION: Stable chronic lung disease.  No acute findings. Electronically Signed    By: Irish LackGlenn  Yamagata M.D.   On: 12/16/2015 14:12   Ct Angio Chest Pe W/cm &/or Wo Cm  12/16/2015  CLINICAL DATA:  Shortness of breath. EXAM: CT ANGIOGRAPHY CHEST WITH CONTRAST TECHNIQUE: Multidetector CT imaging of the chest was performed using the standard protocol during bolus administration of intravenous contrast. Multiplanar CT image reconstructions and MIPs were obtained to evaluate the vascular anatomy. CONTRAST:  100mL OMNIPAQUE IOHEXOL 350 MG/ML SOLN COMPARISON:  CT scan of August 27, 2015. FINDINGS: No pneumothorax or pleural effusion is noted. Stable mosaic pattern is seen throughout the lungs. Stable scarring is seen in the inferior portion of the lingular segment of left upper lobe. There is no evidence of pulmonary embolus. There is no evidence of thoracic aortic dissection or aneurysm. No significant abnormality seen within the visualized portion of upper abdomen. No mediastinal mass or adenopathy is noted. No significant osseous abnormality is noted. Review of the MIP images confirms the above findings. IMPRESSION: No evidence of pulmonary embolus. No acute cardiopulmonary abnormality seen. Electronically Signed   By: Lupita RaiderJames  Green Jr, M.D.   On: 12/16/2015 22:06    ASSESSMENT / PLAN:  Acute on chronic hypoxic and hypercarbic respiratory failure -the reason for her worsening hypoxia on exertion is not clear. She does not have obvious bronchospasm, she does not have worsening right heart failure as evidenced by pedal edema or JVD. I will check a BNP. She does not clearly have lower respiratory infection. For embolism has been ruled out by CT angio.   Recommend- Drop Solu-Medrol to 80 every 12 Check BNP, try gentle diuresis -Lasix 40 IV daily We will ambulate her again and decide on how much oxygen she needs on exertion. I wonder if she needs a right heart cath-will discuss with Dr. Marchelle Gearingamaswamy  We will try to obtain the echo result that was done recently- if unable, will repeat echo  here  She will likely need more frequent office visits to prevent hospitalizations in the future  Cyril Mourningakesh Alva MD. FCCP. [facility] Pager [phone] If no response call [phone]     12/18/2015, 11:43 AM

## 2015-12-18 NOTE — Progress Notes (Signed)
PROGRESS NOTE  Mary HakimMichelle L Harvill WJX:[id number] DOB: 07/08/1976 DOA: 12/16/2015 PCP: Elvina SidleLAUENSTEIN,KURT, MD  HPI/Recap of past 24 hours:  Reported feeling slightly better, had productive cough this am, sputum sample sent to the lab, denies pain  Assessment/Plan: Active Problems:   COPD (chronic obstructive pulmonary disease) (HCC)   Respiratory failure (HCC)  1-Acute Hypoxic on chronic respiratory failure ; secondary to COPD, (per patient this is the third admission this winter) Admitted to telemetry.  Finished prednisone taper 3 days ago prior to admission Treat for COPD exacerbation.  Resume home dose torsemide.  Schedule nebulizer treatments.  IV solumedrol.  IV Levaquin.  CT angio no PE pulm input appreciated   2-OSA; BIPAP  Code Status: presume full code.  DVT Prophylaxis: Lovenox.  Family Communication: patient.   Disposition Plan: peding   Consultants:  primary pulmonologist Dr Efraim Kaufmannamsawamy called and recommending calling pulm consult , pulm consulted  Procedures:  none  Antibiotics:  levaquin   Objective: BP 124/92 mmHg  Pulse 68  Temp(Src) 97.6 F (36.4 C) (Oral)  Resp 18  Ht 5' 2.5" (1.588 m)  Wt 93.1 kg (205 lb 4 oz)  BMI 36.92 kg/m2  SpO2 92%  LMP 11/25/2015 No intake or output data in the 24 hours ending 12/18/15 1729 Filed Weights   12/16/15 1834 12/17/15 0500 12/18/15 0520  Weight: 92.352 kg (203 lb 9.6 oz) 93.2 kg (205 lb 7.5 oz) 93.1 kg (205 lb 4 oz)    Exam:   General:  NAD, able to talk in full sentences   Cardiovascular: RRR  Respiratory: better aeration today, prolonged expiratory phase, mild scattered end expiratory wheezes, no rhonchi, no rales  Abdomen: Soft/ND/NT, positive BS  Musculoskeletal: No Edema  Neuro: aaox3  Data Reviewed: Basic Metabolic Panel:  Recent Labs Lab 12/16/15 1503 12/18/15 0525  NA 136 140  K 5.1 4.6  CL 94* 96*  CO2 36* 35*  GLUCOSE 106* 305*  BUN 6 15  CREATININE 0.48 0.69   CALCIUM 9.0 9.7  MG  --  2.3   Liver Function Tests:  Recent Labs Lab 12/16/15 1503 12/18/15 0525  AST 53* 18  ALT 42 27  ALKPHOS 101 80  BILITOT 1.8* 0.3  PROT 7.0 6.9  ALBUMIN 3.8 3.9   No results for input(s): LIPASE, AMYLASE in the last 168 hours. No results for input(s): AMMONIA in the last 168 hours. CBC:  Recent Labs Lab 12/16/15 1503  WBC 9.4  HGB 14.0  HCT 45.7  MCV 105.3*  PLT 154   Cardiac Enzymes:   No results for input(s): CKTOTAL, CKMB, CKMBINDEX, TROPONINI in the last 168 hours. BNP (last 3 results)  Recent Labs  01/13/15 0049  BNP 86.5    ProBNP (last 3 results) No results for input(s): PROBNP in the last 8760 hours.  CBG: No results for input(s): GLUCAP in the last 168 hours.  Recent Results (from the past 240 hour(s))  MRSA PCR Screening     Status: None   Collection Time: 12/17/15  1:33 PM  Result Value Ref Range Status   MRSA by PCR NEGATIVE NEGATIVE Final    Comment:        The GeneXpert MRSA Assay (FDA approved for NASAL specimens only), is one component of a comprehensive MRSA colonization surveillance program. It is not intended to diagnose MRSA infection nor to guide or monitor treatment for MRSA infections.   Culture, expectorated sputum-assessment     Status: None   Collection Time: 12/18/15  8:04 AM  Result Value Ref Range Status   Specimen Description SPUTUM  Final   Special Requests NONE  Final   Sputum evaluation   Final    THIS SPECIMEN IS ACCEPTABLE. RESPIRATORY CULTURE REPORT TO FOLLOW.   Report Status 12/18/2015 FINAL  Final     Studies: No results found.  Scheduled Meds: . enoxaparin (LOVENOX) injection  40 mg Subcutaneous Q24H  . furosemide  40 mg Intravenous Daily  . ipratropium-albuterol  3 mL Nebulization Q6H  . levofloxacin (LEVAQUIN) IV  500 mg Intravenous Q24H  . methylPREDNISolone (SOLU-MEDROL) injection  80 mg Intravenous Q12H  . mometasone-formoterol  2 puff Inhalation BID  . polyethylene  glycol  17 g Oral Daily  . sodium chloride  3 mL Intravenous Q12H  . sodium chloride  3 mL Intravenous Q12H    Continuous Infusions:    Time spent:  Vong Garringer MD, PhD  [facility] Pager [phone]. If 7PM-7AM, please contact night-coverage at [email or web address], password [facility] 12/18/2015, 5:29 PM  LOS: 2 days

## 2015-12-18 NOTE — Progress Notes (Signed)
Pt manages BIPAP w/o Resp. Assistance. She mentioned waking up a couple times last night feeling like she was suffocation. Her Bipap machine was set to Auto Mode Hi: 20, Lo: 5. I adjusted settings to BI-Level: IPAP: 16, EPAP: 7. The patient stated these were settings of home machine. Pt tried on mask and says the pressure I adjust her to feels much better and like her home machine. She understands to call with questions/concerns. RT will monitor.

## 2015-12-18 NOTE — Progress Notes (Signed)
Walked with patient the full length around the hall.  Patient tolerated well.  O2 sats ranged from 89-91% on 2L.  Pt stated that her baseline 02 was 90-91%.  Pt said she did not feel SOB.

## 2015-12-19 DIAGNOSIS — J441 Chronic obstructive pulmonary disease with (acute) exacerbation: Principal | ICD-10-CM

## 2015-12-19 DIAGNOSIS — I509 Heart failure, unspecified: Secondary | ICD-10-CM

## 2015-12-19 DIAGNOSIS — G4733 Obstructive sleep apnea (adult) (pediatric): Secondary | ICD-10-CM | POA: Insufficient documentation

## 2015-12-19 LAB — BASIC METABOLIC PANEL
ANION GAP: 13 (ref 5–15)
BUN: 23 mg/dL — ABNORMAL HIGH (ref 6–20)
CHLORIDE: 95 mmol/L — AB (ref 101–111)
CO2: 33 mmol/L — AB (ref 22–32)
Calcium: 10 mg/dL (ref 8.9–10.3)
Creatinine, Ser: 0.91 mg/dL (ref 0.44–1.00)
GFR calc non Af Amer: >60 mL/min (ref >60)
Glucose, Bld: 428 mg/dL — ABNORMAL HIGH (ref 65–99)
POTASSIUM: 4.6 mmol/L (ref 3.5–5.1)
Sodium: 141 mmol/L (ref 135–145)

## 2015-12-19 LAB — BRAIN NATRIURETIC PEPTIDE: B Natriuretic Peptide: 192.9 pg/mL — ABNORMAL HIGH (ref 0.0–100.0)

## 2015-12-19 MED ORDER — AMOXICILLIN-POT CLAVULANATE 875-125 MG PO TABS: 1.0000 | ORAL_TABLET | Freq: Two times a day (BID) | ORAL | Status: DC

## 2015-12-19 MED ORDER — TORSEMIDE 20 MG PO TABS: 20.0000 mg | ORAL_TABLET | ORAL | Status: DC

## 2015-12-19 MED ORDER — PREDNISONE 10 MG PO TABS: ORAL_TABLET | ORAL | Status: DC

## 2015-12-19 NOTE — Discharge Summary (Signed)
Discharge Summary  Mary HakimMichelle L Alverson ZOX:[id number] DOB: 10/01/1976  PCP: Elvina SidleLAUENSTEIN,KURT, MD  Admit date: 12/16/2015 Discharge date: 12/19/2015  Time spent: <8330mins  Recommendations for Outpatient Follow-up:  1. F/u with PMD with in two weeks for hospital discharge follow up 2. F/u with cardiology on 1/16 3. F/u with pulmonology on 1/17 4. New meds" prednisone taper, augmentin, demadex  Discharge Diagnoses:  Active Hospital Problems   Diagnosis Date Noted  . OSA treated with BiPAP   . COPD (chronic obstructive pulmonary disease) (HCC) 12/16/2015  . Respiratory failure (HCC) 12/16/2015    Resolved Hospital Problems   Diagnosis Date Noted Date Resolved  No resolved problems to display.    Discharge Condition: stable  Diet recommendation: heart healthy/carb modified  Filed Weights   12/17/15 0500 12/18/15 0520 12/19/15 0626  Weight: 93.2 kg (205 lb 7.5 oz) 93.1 kg (205 lb 4 oz) 92.579 kg (204 lb 1.6 oz)    History of present illness:  Mary [name] is a 40 y.o. female With PMH sigh=nificant for COPD, on 2 -3 L of oxygen, PE, CHF, who presents with worsening SOB on exertion and rest. Report mild cough. Dyspnea getting worse over last few days. Has been in the hospital 3 times over last 3 months. No chest pain. Denies fluids retention. No fever, no runny nose, no muscle pain.   Hospital Course:  Active Problems:   COPD (chronic obstructive pulmonary disease) (HCC)   Respiratory failure (HCC)   OSA treated with BiPAP  1-Acute Hypoxic on chronic respiratory failure ; secondary to COPD, (per patient this is the third admission this winter,Finished prednisone taper 3 days ago prior to admission) Admitted to telemetry. Treat for COPD exacerbation.  Resume home dose torsemide.  Schedule nebulizer treatments.  IV solumedrol.  IV Levaquin.  CT angio no PE pulm input appreciated, she improved with steroid/abx/nebs and diuretics. She is cleared to be discharged home by  pulm with slow steroid taper, close pulm and cards follow up to consider right heart cath.   2-OSA; BIPAP  Code Status: full code.    Family Communication: patient.   Disposition Plan: home   Consultants:  primary pulmonologist Dr Efraim Kaufmannamsawamy called and recommending calling pulm consult , pulm consulted  Procedures:  none  Antibiotics:  levaquin   Discharge Exam: BP 129/81 mmHg  Pulse 51  Temp(Src) 97.8 F (36.6 C) (Oral)  Resp 20  Ht 5' 2.5" (1.588 m)  Wt 92.579 kg (204 lb 1.6 oz)  BMI 36.71 kg/m2  SpO2 95%  LMP 11/25/2015   General: NAD, able to talk in full sentences   Cardiovascular: RRR  Respiratory: better aeration today, prolonged expiratory phase, mild scattered end expiratory wheezes, no rhonchi, no rales  Abdomen: Soft/ND/NT, positive BS  Musculoskeletal: No Edema  Neuro: aaox3   Discharge Instructions You were cared for by a hospitalist during your hospital stay. If you have any questions about your discharge medications or the care you received while you were in the hospital after you are discharged, you can call the unit and asked to speak with the hospitalist on call if the hospitalist that took care of you is not available. Once you are discharged, your primary care physician will handle any further medical issues. Please note that NO REFILLS for any discharge medications will be authorized once you are discharged, as it is imperative that you return to your primary care physician (or establish a relationship with a primary care physician if you do not have one) for  your aftercare needs so that they can reassess your need for medications and monitor your lab values.      Discharge Instructions    Diet - low sodium heart healthy    Complete by:  As directed      Increase activity slowly    Complete by:  As directed             Medication List    STOP taking these medications        levofloxacin 500 MG tablet  Commonly known as:   LEVAQUIN      TAKE these medications        albuterol 108 (90 Base) MCG/ACT inhaler  Commonly known as:  PROVENTIL HFA;VENTOLIN HFA  Inhale 2 puffs into the lungs every 6 (six) hours as needed for wheezing or shortness of breath.     amoxicillin-clavulanate 875-125 MG tablet  Commonly known as:  AUGMENTIN  Take 1 tablet by mouth 2 (two) times daily.     docusate sodium 100 MG capsule  Commonly known as:  COLACE  Take 2 capsules (200 mg total) by mouth at bedtime.     Fluticasone-Salmeterol 250-50 MCG/DOSE Aepb  Commonly known as:  ADVAIR  Inhale 1 puff into the lungs every 12 (twelve) hours.     OXYGEN  Inhale 2 L into the lungs continuous.     polyethylene glycol packet  Commonly known as:  MIRALAX / GLYCOLAX  Take 17 g by mouth daily.     predniSONE 10 MG tablet  Commonly known as:  DELTASONE  Label  & dispense according to the schedule below. 4 Pills PO daily for a week , then3 Pills PO daily for a week, 2 Pills PO daily for a week, 1 Pill PO daily for a week, then stop.     simethicone 80 MG chewable tablet  Commonly known as:  GAS-X  Chew 1 tablet (80 mg total) by mouth every 6 (six) hours as needed for flatulence.     SPIRIVA HANDIHALER 18 MCG inhalation capsule  Generic drug:  tiotropium  PLACE 1 CAPSULE (18 MCG TOTAL) INTO INHALER AND INHALE DAILY.     torsemide 20 MG tablet  Commonly known as:  DEMADEX  Take 1 tablet (20 mg total) by mouth every Monday, Wednesday, and Friday.       No Known Allergies Follow-up Information    Follow up with Arvilla Meres, MD On 12/22/2015.   Specialty:  Cardiology   Why:  at 200 pm for post hospital follow up. Please bring all of your medications to your visit. Code for patient parking is 1000.   Contact information:   [address] [place] [address] [phone]       Follow up with [facility], NP On 12/23/2015.   Specialty:  Pulmonary Disease   Why:  Appt at 3:00 PM    Contact  information:   [address] [place] [address] [phone]        The results of significant diagnostics from this hospitalization (including imaging, microbiology, ancillary and laboratory) are listed below for reference.    Significant Diagnostic Studies: Dg Chest 2 View  12/16/2015  CLINICAL DATA: Shortness of breath with exertion.  History of COPD. EXAM: CHEST - 2 VIEW COMPARISON:  11/13/2015 FINDINGS: Stable chronic lung disease with bibasilar scarring and atelectasis. There is no evidence of pulmonary edema, consolidation, pneumothorax, nodule or pleural fluid. The heart size is stable. The bony thorax shows no  abnormalities. IMPRESSION: Stable chronic lung disease.  No acute findings. Electronically Signed   By: Irish Lack M.D.   On: 12/16/2015 14:12   Ct Angio Chest Pe W/cm &/or Wo Cm  12/16/2015  CLINICAL DATA:  Shortness of breath. EXAM: CT ANGIOGRAPHY CHEST WITH CONTRAST TECHNIQUE: Multidetector CT imaging of the chest was performed using the standard protocol during bolus administration of intravenous contrast. Multiplanar CT image reconstructions and MIPs were obtained to evaluate the vascular anatomy. CONTRAST:  OMNIPAQUE IOHEXOL 350 MG/ML SOLN COMPARISON:  CT scan of August 27, 2015. FINDINGS: No pneumothorax or pleural effusion is noted. Stable mosaic pattern is seen throughout the lungs. Stable scarring is seen in the inferior portion of the lingular segment of left upper lobe. There is no evidence of pulmonary embolus. There is no evidence of thoracic aortic dissection or aneurysm. No significant abnormality seen within the visualized portion of upper abdomen. No mediastinal mass or adenopathy is noted. No significant osseous abnormality is noted. Review of the MIP images confirms the above findings. IMPRESSION: No evidence of pulmonary embolus. No acute cardiopulmonary abnormality seen. Electronically Signed   By: Lupita Raider, M.D.   On: 12/16/2015 22:06      Microbiology: Recent Results (from the past 240 hour(s))  MRSA PCR Screening     Status: None   Collection Time: 12/17/15  1:33 PM  Result Value Ref Range Status   MRSA by PCR NEGATIVE NEGATIVE Final    Comment:        The GeneXpert MRSA Assay (FDA approved for NASAL specimens only), is one component of a comprehensive MRSA colonization surveillance program. It is not intended to diagnose MRSA infection nor to guide or monitor treatment for MRSA infections.   Culture, expectorated sputum-assessment     Status: None   Collection Time: 12/18/15  8:04 AM  Result Value Ref Range Status   Specimen Description SPUTUM  Final   Special Requests NONE  Final   Sputum evaluation   Final    THIS SPECIMEN IS ACCEPTABLE. RESPIRATORY CULTURE REPORT TO FOLLOW.   Report Status 12/18/2015 FINAL  Final  Culture, respiratory (NON-Expectorated)     Status: None (Preliminary result)   Collection Time: 12/18/15  8:04 AM  Result Value Ref Range Status   Specimen Description SPUTUM  Final   Special Requests NONE  Final   Gram Stain   Final    MODERATE WBC PRESENT, PREDOMINANTLY PMN FEW SQUAMOUS EPITHELIAL CELLS PRESENT ABUNDANT GRAM POSITIVE COCCI IN PAIRS IN CHAINS FEW GRAM POSITIVE RODS RARE GRAM NEGATIVE RODS    Culture   Final    Culture reincubated for better growth Performed at [facility]    Report Status PENDING  Incomplete     Labs: Basic Metabolic Panel:  Recent Labs Lab 12/16/15 1503 12/18/15 0525 12/19/15 0440  NA 136 140 141  K 5.1 4.6 4.6  CL 94* 96* 95*  CO2 36* 35* 33*  GLUCOSE 106* 305* 428*  BUN 6 15 23*  CREATININE 0.48 0.69 0.91  CALCIUM 9.0 9.7 10.0  MG  --  2.3  --    Liver Function Tests:  Recent Labs Lab 12/16/15 1503 12/18/15 0525  AST 53* 18  ALT 42 27  ALKPHOS 101 80  BILITOT 1.8* 0.3  PROT 7.0 6.9  ALBUMIN 3.8 3.9   No results for input(s): LIPASE, AMYLASE in the last 168 hours. No results for input(s): AMMONIA in the last  168 hours. CBC:  Recent Labs  Lab 12/16/15 1503  WBC 9.4  HGB 14.0  HCT 45.7  MCV 105.3*  PLT 154   Cardiac Enzymes: No results for input(s): CKTOTAL, CKMB, CKMBINDEX, TROPONINI in the last 168 hours. BNP: BNP (last 3 results)  Recent Labs  01/13/15 0049 12/19/15 0440  BNP 86.5 192.9*    ProBNP (last 3 results) No results for input(s): PROBNP in the last 8760 hours.  CBG: No results for input(s): GLUCAP in the last 168 hours.     SignedAlbertine Grates MD, PhD  [facility] 12/19/2015, 10:39 PM

## 2015-12-19 NOTE — Progress Notes (Signed)
Name: Mary [name] MRN: [id number] DOB: 1976/03/15    ADMISSION DATE:  12/16/2015 CONSULTATION DATE:  12/19/2015   REFERRING MD :  Ledora Bottcher  CHIEF COMPLAINT:  Worsening oxygen levels    HISTORY OF PRESENT ILLNESS:  This is the third hospital visit in the last 6 weeks for this 40 year old ex-smoker with chronic respiratory failure this has been attributed to COPD-lung function testing does show significant airway obstruction. She also has a history of congenital heart surgery in the past. Right heart cath in 2013 showed severe pulmonary hypertension with normal wedge pressure and a cardiac index of 2.76. Surprisingly echo in 2015 showed normal right heart function. She has been maintained on 2 L of oxygen for the past several years and nocturnal BiPAP. She reports good compliance with her BiPAP. She was deemed not to be a transplant candidate due to difficult social situation She was hospitalized around Christmastime at [facility] about 3 hours away. Over the past few days she has noted drop in her oxygen levels with minimal ambulation. She complains of generalized weakness, no clear respiratory distress. She denies URI symptoms, sick contacts or wheezing. There is no pedal edema, she has torsemide which she takes on an as-needed basis She is maintained on Spiriva and Advair reports compliance Her portable oxygen refill tank had given out and hence she had to call the ambulance for transport.  CT Angio and did not show any evidence of pulmonary embolism She's been treated with steroids and bronchodilators. She continues to desaturate on walking to the bathroom on 2 L     Significant tests/ events  Alpha 1 is MM - checked May 2013. per hx her dad had copd.  - PFTS 03/08/12: fev1 0.58L/1%, FVC 1.18/33%, Ratop 49 and c/w ssevere obstruction. 21% BD response on FVC, RV 219%, DLCO 11/54% - PFT 04/10/12: fev1 0.54L/18%, 31% BD response, RATio 49, TL 88%, RV 204$, DLCO 30%,  - Abg  baseline MAy 2013: 7.43/50/81  SUBJECTIVE: Breathing is improved Denies chest pain Slept well-BiPAP settings had to be adjusted  VITAL SIGNS: Temp:  [97.6 F (36.4 C)-97.8 F (36.6 C)] 97.8 F (36.6 C) (01/13 0626) Pulse Rate:  [55-68] 65 (01/13 0930) Resp:  [18] 18 (01/13 0930) BP: (124-135)/(77-92) 126/77 mmHg (01/13 0626) SpO2:  [92 %-99 %] 96 % (01/13 0930) Weight:  [204 lb 1.6 oz (92.579 kg)] 204 lb 1.6 oz (92.579 kg) (01/13 0626)  PHYSICAL EXAMINATION: Gen. Pleasant, obese, in no distress, normal affect ENT - no lesions, no post nasal drip, class 2-3 airway Neck: No JVD, no thyromegaly, no carotid bruits Lungs: no use of accessory muscles, no dullness to percussion, decreased without rales or rhonchi  Cardiovascular: Rhythm regular, heart sounds  normal, no murmurs or gallops, no peripheral edema Abdomen: soft and non-tender, no hepatosplenomegaly, BS normal. Musculoskeletal: No deformities, no cyanosis or clubbing Neuro:  alert, non focal, no tremors    Recent Labs Lab 12/16/15 1503 12/18/15 0525 12/19/15 0440  NA 136 140 141  K 5.1 4.6 4.6  CL 94* 96* 95*  CO2 36* 35* 33*  BUN 6 15 23*  CREATININE 0.48 0.69 0.91  GLUCOSE 106* 305* 428*    Recent Labs Lab 12/16/15 1503  HGB 14.0  HCT 45.7  WBC 9.4  PLT 154   No results found.  ASSESSMENT / PLAN:  Acute on chronic hypoxic and hypercarbic respiratory failure -the reason for her worsening hypoxia on exertion is not clear. She does not have obvious  bronchospasm, she does not have worsening right heart failure as evidenced by pedal edema or JVD. BNP not too high. She does not clearly have lower respiratory infection. Pulmonary embolism has been ruled out by CT angio. Recommend-  Can change to oral prednisone 40 mg and taper slowly by 10 mg/week Get back to oral torsemide 3 times per week- m/w/f She seems to maintain her oxygen saturation on ambulation I wonder if she needs a right heart cath -discussed  with Dr. Gala RomneyBensimhon, who will make arrangements to see her in the clinic  We will try to obtain the echo result that was done recently- if unable, will repeat echo here  She will likely need more frequent office visits to prevent hospitalizations in the future - outpatient appointment made  [facility] as needed, she can be discharged today  Cyril Mourningakesh Alva MD. [facility] Center[facility]CCP. [facility] Pager [phone] If no response call [phone]     12/19/2015, 12:36 PM

## 2015-12-19 NOTE — Progress Notes (Signed)
Went over all discharge information with patient.  Prescriptions and discharge summary given.  Portable o2 tank hooked up.  Pt wheeled out by NT.

## 2015-12-20 LAB — CULTURE, RESPIRATORY: CULTURE: NORMAL

## 2015-12-20 LAB — CULTURE, RESPIRATORY W GRAM STAIN

## 2015-12-22 ENCOUNTER — Encounter (HOSPITAL_COMMUNITY): Payer: 59 | Admitting: Internal Medicine

## 2015-12-23 ENCOUNTER — Encounter: Payer: Self-pay | Admitting: Adult Health

## 2015-12-23 ENCOUNTER — Ambulatory Visit (INDEPENDENT_AMBULATORY_CARE_PROVIDER_SITE_OTHER): Payer: 59 | Admitting: Adult Health

## 2015-12-23 VITALS — BP 126/78 | HR 95 | Temp 98.4°F | Ht 62.0 in | Wt 204.0 lb

## 2015-12-23 DIAGNOSIS — I2781 Cor pulmonale (chronic): Secondary | ICD-10-CM

## 2015-12-23 DIAGNOSIS — G4733 Obstructive sleep apnea (adult) (pediatric): Secondary | ICD-10-CM | POA: Diagnosis not present

## 2015-12-23 DIAGNOSIS — J9611 Chronic respiratory failure with hypoxia: Secondary | ICD-10-CM | POA: Diagnosis not present

## 2015-12-23 DIAGNOSIS — J441 Chronic obstructive pulmonary disease with (acute) exacerbation: Secondary | ICD-10-CM | POA: Diagnosis not present

## 2015-12-23 NOTE — Progress Notes (Signed)
Subjective:    Patient ID: Mary [name], female    DOB: [birth year], 40 y.o.   MRN: [id number]  HPI 40 yo female former smoker with very severe COPD and chronic resp failure and severe pulmonary HTN  OSA/OHS on BIPAP  Hx of congenital heart surgery .  RHC 2013 with severe pulmonary HTN . Echo in 2015 w/ nml heart function.  Deemed not heart transplant due to difficult social situation  TEST  Alpha 1 MM 2013  PFTS 03/08/12: fev1 0.58L/1%, FVC 1.18/33%, Ratop 49 and c/w ssevere obstruction. 21% BD response on FVC, RV 219%, DLCO 11/54% - PFT 04/10/12: fev1 0.54L/18%, 31% BD response, RATio 49, TL 88%, RV 204$, DLCO 30%,  - Abg baseline MAy 2013: 7.43/50/81 CTA Chest 12/16/15 >neg PE, stable scarring in lingular/LUL.     12/23/2015 Post Hospital Follow up  Pt returns for a post hospital follow up . She was re- admitted 1/10 to 1/13 for acute on chronic hypoxic RF  with COPD exacerbation .  Since discharge she remains weak, had intermittent wheezing with act. Gets winded easily. Has clear chest congestion in am. Feels face is puffy especially around her eyes. No tongue/lip swelling. No difficulty swallowing. No rash or hives. Feels she is retaining water in hands as well. She is on demadex 3 days a wekk.  Denies any chest tightness, sinus pressure/drainage, fever, nausea or vomiting.  Or hemoptysis .  She has finished abx 2 days ago. Has couple of prednisone tabs left.  Remains on spiriva and advair.  She is on O2 at 2l/m .  She has been set up with cardiology in 2 weeks for evaluation if right heart cath needs to be repeated.  She remains on BIPAP At bedtime  .  Previous GGO RUL followed by serial CT with stable area on 08/2015 for ~ 2 years c/w benign process, previous PET scan neg.    Past Medical History  Diagnosis Date  . COPD (chronic obstructive pulmonary disease) (HCC)     PFTS 03/08/12: fev1 0.58L/1%, FVC 1.18/33%, Ratop 49 and c/w ssevere obstruction. 21% BD response on FVC, RV  219%, DLCO 11/54%  . History of alcoholism (HCC)   . Irregular menses   . DVT of upper extremity (deep vein thrombosis) [facility]) April 2013    right subclavian // Unclear precipitating cause - possibly significant right heart failure, with vascular stasis potentially predisposing to clotting.    . Pulmonary embolus [facility]) April 2013    Precipitating cause unclear. Was treated with coumadin from April-June 2013.  . Moderate to severe pulmonary hypertension [facility]) April 2013    Cardiac cath on 03/06/12 - 1. Elevated pulmonary artery pressures, right sided filling pressures.,  2. PA: 64/45 (mean 53)    . Hepatic cirrhosis (HCC)     Questionable history of - Noted on CT abdomen (03/2012) - thought to be due to vascular congestion from right heart failure +/- patient's history of alcohol abuse  . Axillary adenopathy     right axillary adenopathy noted on CT chest (03/06/2012)  . Periodontal disease   . Chronic right-sided CHF (congestive heart failure) (HCC) 03/23/2012    with cor pulmonale. Last RHC 03/2012  . Pneumonia ~ 1985; 01/2011  . History of chronic bronchitis   . Exertional shortness of breath   . On home oxygen therapy     "2-3 L 24/7" (05/03/2013)  . OSA (obstructive sleep apnea)     wears noctural BiPAP (05/03/2013)  . Migraine     "~  1/yr" (05/03/2013)   Current Outpatient Prescriptions on File Prior to Visit  Medication Sig Dispense Refill  . albuterol (PROVENTIL HFA;VENTOLIN HFA) 108 (90 BASE) MCG/ACT inhaler Inhale 2 puffs into the lungs every 6 (six) hours as needed for wheezing or shortness of breath. (Patient taking differently: Inhale 2 puffs into the lungs 4 (four) times daily. ) 8.5 g 1  . docusate sodium (COLACE) 100 MG capsule Take 2 capsules (200 mg total) by mouth at bedtime. 10 capsule 0  . Fluticasone-Salmeterol (ADVAIR) 250-50 MCG/DOSE AEPB Inhale 1 puff into the lungs every 12 (twelve) hours. 60 each 6  . OXYGEN Inhale 2 L into the lungs continuous.    . polyethylene  glycol (MIRALAX / GLYCOLAX) packet Take 17 g by mouth daily. 14 each 0  . predniSONE (DELTASONE) 10 MG tablet Label  & dispense according to the schedule below. 4 Pills PO daily for a week , then3 Pills PO daily for a week, 2 Pills PO daily for a week, 1 Pill PO daily for a week, then stop. 70 tablet 0  . SPIRIVA HANDIHALER 18 MCG inhalation capsule PLACE 1 CAPSULE (18 MCG TOTAL) INTO INHALER AND INHALE DAILY. 30 capsule 2  . torsemide (DEMADEX) 20 MG tablet Take 1 tablet (20 mg total) by mouth every Monday, Wednesday, and Friday. 90 tablet 3  . simethicone (GAS-X) 80 MG chewable tablet Chew 1 tablet (80 mg total) by mouth every 6 (six) hours as needed for flatulence. (Patient not taking: Reported on 12/23/2015) 30 tablet 0   No current facility-administered medications on file prior to visit.     Review of Systems Constitutional:   No  weight loss, night sweats,  Fevers, chills,  +fatigue, or  lassitude.  HEENT:   No headaches,  Difficulty swallowing,  Tooth/dental problems, or  Sore throat,                No sneezing, itching, ear ache, nasal congestion, post nasal drip,   CV:  No chest pain,  Orthopnea, PND, swelling in lower extremities, anasarca, dizziness, palpitations, syncope.   GI  No heartburn, indigestion, abdominal pain, nausea, vomiting, diarrhea, change in bowel habits, loss of appetite, bloody stools.   Resp:    No chest wall deformity  Skin: no rash or lesions.  GU: no dysuria, change in color of urine, no urgency or frequency.  No flank pain, no hematuria   MS:  No joint pain or swelling.  No decreased range of motion.  No back pain.  Psych:  No change in mood or affect. No depression or anxiety.  No memory loss.         Objective:   Physical Exam  Filed Vitals:   12/23/15 1513  BP: 126/78  Pulse: 95  Temp: 98.4 F (36.9 C)  TempSrc: Oral  Height:  (1.575 m)  Weight: 204 lb (92.534 kg)  SpO2: 92%   Body mass index is 37.3 kg/(m^2).   GEN:  A/Ox3; pleasant , NAD, morbidly obese  HEENT:  [place]/AT,  EACs-clear, TMs-wnl, NOSE-clear, THROAT-clear, no lesions, no postnasal drip or exudate noted.   NECK:  Supple w/ fair ROM; no JVD; normal carotid impulses w/o bruits; no thyromegaly or nodules palpated; no lymphadenopathy.  RESP  Decreased BS in bases, no accessory muscle use, no dullness to percussion  CARD:  RRR, no m/r/g  , tr  peripheral edema, pulses intact, no cyanosis or clubbing.  GI:   Soft & nt; nml bowel sounds; no organomegaly  or masses detected.  Musco: Warm bil, no deformities or joint swelling noted.   Neuro: alert, no focal deficits noted.    Skin: Warm, no lesions or rashes        Assessment & Plan:

## 2015-12-23 NOTE — Patient Instructions (Addendum)
Stop prednisone today .  Take extra Torsemide today then resume regular dosing .  Continue on Advair and Spiriva .  Continue on Oxygen 2l/m .  Follow up with Cardiology as planned .  Continue on BIPAP At bedtime   Low salt diet.  follow up Dr. Marchelle Gearing in 6 weeks and As needed   Please contact office for sooner follow up if symptoms do not improve or worsen or seek emergency care

## 2015-12-25 NOTE — Assessment & Plan Note (Signed)
Cont on current regimen  Follow withy cards in 2 weeks as planned  Plan  Stop prednisone today .  Take extra Torsemide today then resume regular dosing .    Marland Kitchen  Continue on Oxygen 2l/m .  Follow up with Cardiology as planned .  Continue on BIPAP At bedtime   Low salt diet.  follow up Dr. Marchelle Gearing in 6 weeks and As needed   Please contact office for sooner follow up if symptoms do not improve or worsen or seek emergency care

## 2015-12-25 NOTE — Assessment & Plan Note (Signed)
Continue on BIPAP At bedtime   Low salt diet.  follow up Dr. Marchelle Gearing in 6 weeks and As needed   Please contact office for sooner follow up if symptoms do not improve or worsen or seek emergency care

## 2015-12-25 NOTE — Assessment & Plan Note (Addendum)
Recent flare now resolving  ? Fluid retention in face/eyes /hands from steroids -does not appear to be allergic reaction.   Plan  Stop prednisone today .  Take extra Torsemide today then resume regular dosing .  Continue on Advair and Spiriva .  Continue on Oxygen 2l/m .  Follow up with Cardiology as planned .  Continue on BIPAP At bedtime   Low salt diet.  follow up Dr. Marchelle Gearing in 6 weeks and As needed   Please contact office for sooner follow up if symptoms do not improve or worsen or seek emergency care

## 2015-12-25 NOTE — Assessment & Plan Note (Signed)
Continue on Oxygen 2l/m .  Continue on BIPAP At bedtime   Low salt diet.  follow up Dr. Marchelle Gearing in 6 weeks and As needed   Please contact office for sooner follow up if symptoms do not improve or worsen or seek emergency care

## 2016-01-03 ENCOUNTER — Inpatient Hospital Stay (HOSPITAL_COMMUNITY)
Admission: EM | Admit: 2016-01-03 | Discharge: 2016-01-09 | DRG: 190 | Disposition: A | Discharge status: Home / Self Care | Payer: 59 | Attending: Internal Medicine | Admitting: Internal Medicine

## 2016-01-03 ENCOUNTER — Emergency Department (HOSPITAL_COMMUNITY): Payer: 59

## 2016-01-03 ENCOUNTER — Encounter (HOSPITAL_COMMUNITY): Payer: Self-pay | Admitting: Family Medicine

## 2016-01-03 DIAGNOSIS — E876 Hypokalemia: Secondary | ICD-10-CM | POA: Diagnosis present

## 2016-01-03 DIAGNOSIS — Z86718 Personal history of other venous thrombosis and embolism: Secondary | ICD-10-CM | POA: Diagnosis not present

## 2016-01-03 DIAGNOSIS — J449 Chronic obstructive pulmonary disease, unspecified: Secondary | ICD-10-CM | POA: Diagnosis present

## 2016-01-03 DIAGNOSIS — I2721 Secondary pulmonary arterial hypertension: Secondary | ICD-10-CM | POA: Diagnosis present

## 2016-01-03 DIAGNOSIS — J9622 Acute and chronic respiratory failure with hypercapnia: Secondary | ICD-10-CM | POA: Diagnosis present

## 2016-01-03 DIAGNOSIS — E872 Acidosis: Secondary | ICD-10-CM | POA: Diagnosis present

## 2016-01-03 DIAGNOSIS — I5020 Unspecified systolic (congestive) heart failure: Secondary | ICD-10-CM | POA: Diagnosis present

## 2016-01-03 DIAGNOSIS — J441 Chronic obstructive pulmonary disease with (acute) exacerbation: Secondary | ICD-10-CM | POA: Diagnosis not present

## 2016-01-03 DIAGNOSIS — I110 Hypertensive heart disease with heart failure: Secondary | ICD-10-CM | POA: Diagnosis present

## 2016-01-03 DIAGNOSIS — I50812 Chronic right heart failure: Secondary | ICD-10-CM

## 2016-01-03 DIAGNOSIS — J069 Acute upper respiratory infection, unspecified: Secondary | ICD-10-CM | POA: Diagnosis present

## 2016-01-03 DIAGNOSIS — Z6839 Body mass index (BMI) 39.0-39.9, adult: Secondary | ICD-10-CM | POA: Diagnosis not present

## 2016-01-03 DIAGNOSIS — Z79899 Other long term (current) drug therapy: Secondary | ICD-10-CM | POA: Diagnosis not present

## 2016-01-03 DIAGNOSIS — I509 Heart failure, unspecified: Secondary | ICD-10-CM | POA: Diagnosis not present

## 2016-01-03 DIAGNOSIS — T380X5A Adverse effect of glucocorticoids and synthetic analogues, initial encounter: Secondary | ICD-10-CM | POA: Diagnosis present

## 2016-01-03 DIAGNOSIS — I5022 Chronic systolic (congestive) heart failure: Secondary | ICD-10-CM | POA: Diagnosis present

## 2016-01-03 DIAGNOSIS — A419 Sepsis, unspecified organism: Secondary | ICD-10-CM | POA: Diagnosis present

## 2016-01-03 DIAGNOSIS — R739 Hyperglycemia, unspecified: Secondary | ICD-10-CM | POA: Diagnosis present

## 2016-01-03 DIAGNOSIS — I272 Other secondary pulmonary hypertension: Secondary | ICD-10-CM | POA: Diagnosis present

## 2016-01-03 DIAGNOSIS — J9621 Acute and chronic respiratory failure with hypoxia: Secondary | ICD-10-CM | POA: Diagnosis present

## 2016-01-03 DIAGNOSIS — T502X5A Adverse effect of carbonic-anhydrase inhibitors, benzothiadiazides and other diuretics, initial encounter: Secondary | ICD-10-CM | POA: Diagnosis present

## 2016-01-03 DIAGNOSIS — E662 Morbid (severe) obesity with alveolar hypoventilation: Secondary | ICD-10-CM | POA: Diagnosis present

## 2016-01-03 DIAGNOSIS — Z87891 Personal history of nicotine dependence: Secondary | ICD-10-CM

## 2016-01-03 DIAGNOSIS — K7460 Unspecified cirrhosis of liver: Secondary | ICD-10-CM | POA: Diagnosis present

## 2016-01-03 DIAGNOSIS — Z8249 Family history of ischemic heart disease and other diseases of the circulatory system: Secondary | ICD-10-CM

## 2016-01-03 DIAGNOSIS — Z86711 Personal history of pulmonary embolism: Secondary | ICD-10-CM | POA: Diagnosis not present

## 2016-01-03 DIAGNOSIS — J9690 Respiratory failure, unspecified, unspecified whether with hypoxia or hypercapnia: Secondary | ICD-10-CM

## 2016-01-03 DIAGNOSIS — E8729 Other acidosis: Secondary | ICD-10-CM | POA: Diagnosis present

## 2016-01-03 DIAGNOSIS — G4733 Obstructive sleep apnea (adult) (pediatric): Secondary | ICD-10-CM | POA: Diagnosis present

## 2016-01-03 DIAGNOSIS — I50810 Right heart failure, unspecified: Secondary | ICD-10-CM | POA: Diagnosis present

## 2016-01-03 DIAGNOSIS — Z9981 Dependence on supplemental oxygen: Secondary | ICD-10-CM

## 2016-01-03 DIAGNOSIS — J9601 Acute respiratory failure with hypoxia: Secondary | ICD-10-CM | POA: Diagnosis not present

## 2016-01-03 DIAGNOSIS — J189 Pneumonia, unspecified organism: Secondary | ICD-10-CM | POA: Diagnosis not present

## 2016-01-03 DIAGNOSIS — J9611 Chronic respiratory failure with hypoxia: Secondary | ICD-10-CM

## 2016-01-03 DIAGNOSIS — J9600 Acute respiratory failure, unspecified whether with hypoxia or hypercapnia: Secondary | ICD-10-CM | POA: Diagnosis present

## 2016-01-03 DIAGNOSIS — B9789 Other viral agents as the cause of diseases classified elsewhere: Secondary | ICD-10-CM | POA: Diagnosis present

## 2016-01-03 LAB — CBC WITH DIFFERENTIAL/PLATELET
Basophils Absolute: 0 10*3/uL (ref 0.0–0.1)
Basophils Relative: 0 %
EOS ABS: 0.1 10*3/uL (ref 0.0–0.7)
EOS PCT: 1 %
HCT: 43.4 % (ref 36.0–46.0)
HEMOGLOBIN: 13.8 g/dL (ref 12.0–15.0)
LYMPHS ABS: 1.1 10*3/uL (ref 0.7–4.0)
LYMPHS PCT: 11 %
MCH: 32.2 pg (ref 26.0–34.0)
MCHC: 31.8 g/dL (ref 30.0–36.0)
MCV: 101.4 fL — AB (ref 78.0–100.0)
MONOS PCT: 9 %
Monocytes Absolute: 0.9 10*3/uL (ref 0.1–1.0)
Neutro Abs: 7.9 10*3/uL — ABNORMAL HIGH (ref 1.7–7.7)
Neutrophils Relative %: 79 %
PLATELETS: 129 10*3/uL — AB (ref 150–400)
RBC: 4.28 MIL/uL (ref 3.87–5.11)
RDW: 15.7 % — ABNORMAL HIGH (ref 11.5–15.5)
WBC: 10.1 10*3/uL (ref 4.0–10.5)

## 2016-01-03 LAB — I-STAT ARTERIAL BLOOD GAS, ED
Acid-Base Excess: 5 mmol/L — ABNORMAL HIGH (ref 0.0–2.0)
Acid-Base Excess: 4 mmol/L — ABNORMAL HIGH (ref 0.0–2.0)
BICARBONATE: 34.2 meq/L — AB (ref 20.0–24.0)
Bicarbonate: 35.1 mEq/L — ABNORMAL HIGH (ref 20.0–24.0)
O2 SAT: 83 %
O2 SAT: 93 %
PCO2 ART: 83 mmHg — AB (ref 35.0–45.0)
PCO2 ART: 79.1 mmHg — AB (ref 35.0–45.0)
PO2 ART: 59 mmHg — AB (ref 80.0–100.0)
Patient temperature: 99
TCO2: 38 mmol/L (ref 0–100)
TCO2: 37 mmol/L (ref 0–100)
pH, Arterial: 7.235 — ABNORMAL LOW (ref 7.350–7.450)
pH, Arterial: 7.243 — ABNORMAL LOW (ref 7.350–7.450)
pO2, Arterial: 84 mmHg (ref 80.0–100.0)

## 2016-01-03 LAB — I-STAT CG4 LACTIC ACID, ED
LACTIC ACID, VENOUS: 1.08 mmol/L (ref 0.5–2.0)
Lactic Acid, Venous: 1.25 mmol/L (ref 0.5–2.0)

## 2016-01-03 LAB — MRSA PCR SCREENING: MRSA by PCR: NEGATIVE

## 2016-01-03 LAB — BASIC METABOLIC PANEL
Anion gap: 11 (ref 5–15)
BUN: 10 mg/dL (ref 6–20)
CHLORIDE: 90 mmol/L — AB (ref 101–111)
CO2: 37 mmol/L — ABNORMAL HIGH (ref 22–32)
CREATININE: 0.7 mg/dL (ref 0.44–1.00)
Calcium: 8.9 mg/dL (ref 8.9–10.3)
GFR calc Af Amer: >60 mL/min (ref >60)
GFR calc non Af Amer: >60 mL/min (ref >60)
GLUCOSE: 156 mg/dL — AB (ref 65–99)
POTASSIUM: 4.2 mmol/L (ref 3.5–5.1)
Sodium: 138 mmol/L (ref 135–145)

## 2016-01-03 LAB — INFLUENZA PANEL BY PCR (TYPE A & B)
H1N1FLUPCR: NOT DETECTED
Influenza A By PCR: NEGATIVE
Influenza B By PCR: NEGATIVE

## 2016-01-03 LAB — STREP PNEUMONIAE URINARY ANTIGEN: Strep Pneumo Urinary Antigen: NEGATIVE

## 2016-01-03 MED ORDER — PIPERACILLIN-TAZOBACTAM 3.375 G IVPB
3.3750 g | Freq: Three times a day (TID) | INTRAVENOUS | Status: DC
  Administered 2016-01-03 – 2016-01-04 (×3): 3.375 g via INTRAVENOUS
  Filled 2016-01-03 (×5): qty 50

## 2016-01-03 MED ORDER — METHYLPREDNISOLONE SODIUM SUCC 125 MG IJ SOLR
60.0000 mg | Freq: Four times a day (QID) | INTRAMUSCULAR | Status: DC
Start: 2016-01-03 — End: 2016-01-04
  Administered 2016-01-03 – 2016-01-04 (×5): 60 mg via INTRAVENOUS
  Filled 2016-01-03 (×5): qty 2

## 2016-01-03 MED ORDER — ARFORMOTEROL TARTRATE 15 MCG/2ML IN NEBU
15.0000 ug | INHALATION_SOLUTION | Freq: Two times a day (BID) | RESPIRATORY_TRACT | Status: DC
  Administered 2016-01-03 – 2016-01-05 (×4): 15 ug via RESPIRATORY_TRACT
  Filled 2016-01-03 (×4): qty 2

## 2016-01-03 MED ORDER — SODIUM CHLORIDE 0.9 % IV BOLUS (SEPSIS)
30.0000 mL/kg | Freq: Once | INTRAVENOUS | Status: AC
  Administered 2016-01-03: 2775 mL via INTRAVENOUS

## 2016-01-03 MED ORDER — PANTOPRAZOLE SODIUM 40 MG PO TBEC
40.0000 mg | DELAYED_RELEASE_TABLET | Freq: Every day | ORAL | Status: DC
  Administered 2016-01-03 – 2016-01-09 (×7): 40 mg via ORAL
  Filled 2016-01-03 (×7): qty 1

## 2016-01-03 MED ORDER — AZITHROMYCIN 500 MG PO TABS: 250.0000 mg | ORAL_TABLET | Freq: Every day | ORAL | Status: DC

## 2016-01-03 MED ORDER — SODIUM CHLORIDE 0.9 % IV SOLN
INTRAVENOUS | Status: AC
  Administered 2016-01-03: 18:00:00 via INTRAVENOUS

## 2016-01-03 MED ORDER — IPRATROPIUM-ALBUTEROL 0.5-2.5 (3) MG/3ML IN SOLN
3.0000 mL | Freq: Once | RESPIRATORY_TRACT | Status: AC
  Administered 2016-01-03: 3 mL via RESPIRATORY_TRACT
  Filled 2016-01-03: qty 3

## 2016-01-03 MED ORDER — IPRATROPIUM-ALBUTEROL 0.5-2.5 (3) MG/3ML IN SOLN
3.0000 mL | Freq: Four times a day (QID) | RESPIRATORY_TRACT | Status: DC
  Administered 2016-01-03 – 2016-01-07 (×15): 3 mL via RESPIRATORY_TRACT
  Filled 2016-01-03 (×16): qty 3

## 2016-01-03 MED ORDER — PIPERACILLIN-TAZOBACTAM 3.375 G IVPB 30 MIN
3.3750 g | Freq: Once | INTRAVENOUS | Status: AC
  Administered 2016-01-03: 3.375 g via INTRAVENOUS
  Filled 2016-01-03: qty 50

## 2016-01-03 MED ORDER — AZITHROMYCIN 500 MG PO TABS: 500.0000 mg | ORAL_TABLET | Freq: Every day | ORAL | Status: DC

## 2016-01-03 MED ORDER — BUDESONIDE 0.5 MG/2ML IN SUSP
0.5000 mg | Freq: Two times a day (BID) | RESPIRATORY_TRACT | Status: DC
  Administered 2016-01-03 – 2016-01-09 (×11): 0.5 mg via RESPIRATORY_TRACT
  Filled 2016-01-03 (×11): qty 2

## 2016-01-03 NOTE — ED Provider Notes (Signed)
Complaints of shortness of breath and productive cough yellow sputum onset yesterday. She treated self with prednisone Darral Dash day and an old dose of an antibiotic area at other associated symptoms include nausea, no vomiting. Exam speaks in sentences, no respirator distress. Lungs longus port phase with expiratory wheeze heart tachycardic regular rhythm abdomen obese nontender extremities without edema or tenderness, neurovascularly intact  1:10 PM patient states breathing is improved after treatment with BiPAP. She is able to speak in sentences She is noted to be in respiratory failure given arterial blood gas and dyspnea. Chest xray viewed by me Code sepsis called based on sirs criteria tachycardia, fever. Pt will be admitted to step down unit. CRITICAL CARE Performed by: Doug Sou Total critical care time:  30 minutes Critical care time was exclusive of separately billable procedures and treating other patients. Critical care was necessary to treat or prevent imminent or life-threatening deterioration. Critical care was time spent personally by me on the following activities: development of treatment plan with patient and/or surrogate as well as nursing, discussions with consultants, evaluation of patient's response to treatment, examination of patient, obtaining history from patient or surrogate, ordering and performing treatments and interventions, ordering and review of laboratory studies, ordering and review of radiographic studies, pulse oximetry and re-evaluation of patient's condition.  Doug Sou, MD 01/03/16 1321

## 2016-01-03 NOTE — ED Provider Notes (Signed)
CSN: [id number]     Arrival date & time 01/03/16  4098 History   First MD Initiated Contact with Patient 01/03/16 [phone]     Chief Complaint  Patient presents with  . Shortness of Breath   HPI   Mary [name] is a 40 y.o. F PMH significant for COPD, PE, DVT presenting with a 2 day history of worsening SOB and productive cough. She took prednisone and doxycycline at home PTA. She is normally on 2L O2 at home. She endorses nausea. She denies fevers, chills, CP, abdominal pain, emesis, change in bowel/bladder habits.  Multiple hospitalizations within the last two months for COPD exacerbation.    Past Medical History  Diagnosis Date  . COPD (chronic obstructive pulmonary disease) (HCC)     PFTS 03/08/12: fev1 0.58L/1%, FVC 1.18/33%, Ratop 49 and c/w ssevere obstruction. 21% BD response on FVC, RV 219%, DLCO 11/54%  . History of alcoholism (HCC)   . Irregular menses   . DVT of upper extremity (deep vein thrombosis) [facility]) April 2013    right subclavian // Unclear precipitating cause - possibly significant right heart failure, with vascular stasis potentially predisposing to clotting.    . Pulmonary embolus [facility]) April 2013    Precipitating cause unclear. Was treated with coumadin from April-June 2013.  . Moderate to severe pulmonary hypertension [facility]) April 2013    Cardiac cath on 03/06/12 - 1. Elevated pulmonary artery pressures, right sided filling pressures.,  2. PA: 64/45 (mean 53)    . Hepatic cirrhosis (HCC)     Questionable history of - Noted on CT abdomen (03/2012) - thought to be due to vascular congestion from right heart failure +/- patient's history of alcohol abuse  . Axillary adenopathy     right axillary adenopathy noted on CT chest (03/06/2012)  . Periodontal disease   . Chronic right-sided CHF (congestive heart failure) (HCC) 03/23/2012    with cor pulmonale. Last RHC 03/2012  . Pneumonia ~ 1985; 01/2011  . History of chronic bronchitis   . Exertional shortness of breath   . On  home oxygen therapy     "2-3 L 24/7" (05/03/2013)  . OSA (obstructive sleep apnea)     wears noctural BiPAP (05/03/2013)  . Migraine     "~ 1/yr" (05/03/2013)   Past Surgical History  Procedure Laterality Date  . Cardiac surgery  May 25, 1977    "my heart was backwards" (05/03/2013)  . Lung surgery  01-17-1977  . Appendectomy  05/03/2013  . Cardiac catheterization  03/2012  . Laparoscopic appendectomy N/A 05/03/2013    Procedure: APPENDECTOMY LAPAROSCOPIC;  Surgeon: Cherylynn Ridges, MD;  Location: [facility] OR;  Service: General;  Laterality: N/A;  . Right heart catheterization N/A 03/07/2012    Procedure: RIGHT HEART CATH;  Surgeon: Kathleene Hazel, MD;  Location: [facility] CATH LAB;  Service: Cardiovascular;  Laterality: N/A;   Family History  Problem Relation Age of Onset  . Hypertension Father   . Heart disease Father     CHF; died age 40   . Alcohol abuse Father   . Other Brother     died age 40 y.o overdose   Social History  Substance Use Topics  . Smoking status: Former Smoker -- 1.00 packs/day for 25 years    Types: Cigarettes    Quit date: 02/18/2012  . Smokeless tobacco: Never Used  . Alcohol Use: No     Comment: previously drinking 1 pint 3-4 days a week for 2002-2013, but in 2014, no  longer drinking   OB History    No data available     Review of Systems  Ten systems are reviewed and are negative for acute change except as noted in the HPI  Allergies  Review of patient's allergies indicates no known allergies.  Home Medications   Prior to Admission medications   Medication Sig Start Date End Date Taking? Authorizing Provider  albuterol (PROVENTIL HFA;VENTOLIN HFA) 108 (90 BASE) MCG/ACT inhaler Inhale 2 puffs into the lungs every 6 (six) hours as needed for wheezing or shortness of breath. Patient taking differently: Inhale 2 puffs into the lungs 4 (four) times daily.  09/06/14   Kalman Shan, MD  docusate sodium (COLACE) 100 MG capsule Take 2 capsules (200 mg total)  by mouth at bedtime. 11/17/15   Calvert Cantor, MD  Fluticasone-Salmeterol (ADVAIR) 250-50 MCG/DOSE AEPB Inhale 1 puff into the lungs every 12 (twelve) hours. 09/27/14   Kalman Shan, MD  OXYGEN Inhale 2 L into the lungs continuous.    Historical Provider, MD  polyethylene glycol (MIRALAX / GLYCOLAX) packet Take 17 g by mouth daily. 11/17/15   Calvert Cantor, MD  predniSONE (DELTASONE) 10 MG tablet Label  & dispense according to the schedule below. 4 Pills PO daily for a week , then3 Pills PO daily for a week, 2 Pills PO daily for a week, 1 Pill PO daily for a week, then stop. 12/19/15   Albertine Grates, MD  simethicone (GAS-X) 80 MG chewable tablet Chew 1 tablet (80 mg total) by mouth every 6 (six) hours as needed for flatulence. Patient not taking: Reported on 12/23/2015 11/17/15   Calvert Cantor, MD  SPIRIVA HANDIHALER 18 MCG inhalation capsule PLACE 1 CAPSULE (18 MCG TOTAL) INTO INHALER AND INHALE DAILY. 10/06/15   Kalman Shan, MD  torsemide (DEMADEX) 20 MG tablet Take 1 tablet (20 mg total) by mouth every Monday, Wednesday, and Friday. 12/19/15   Albertine Grates, MD   BP 129/88 mmHg  Pulse 120  Temp(Src) 100.4 F (38 C) (Rectal)  Resp 19  SpO2 84%  LMP 11/25/2015 Physical Exam  Constitutional: She appears well-developed and well-nourished. No distress.  Obese  HENT:  Head: Normocephalic and atraumatic.  Mouth/Throat: Oropharynx is clear and moist. No oropharyngeal exudate.  Eyes: Conjunctivae are normal. Right eye exhibits no discharge. Left eye exhibits no discharge. No scleral icterus.  Neck: No tracheal deviation present.  Cardiovascular: Regular rhythm, normal heart sounds and intact distal pulses.  Exam reveals no gallop and no friction rub.   No murmur heard. Tachycardic  Pulmonary/Chest: No respiratory distress. She has wheezes. She has no rales. She exhibits no tenderness.  Tachypnea, wheezing BL.  Increased effort; speaking in short sentences  Abdominal: Soft. Bowel sounds are normal.  She exhibits no distension and no mass. There is no tenderness. There is no rebound and no guarding.  Musculoskeletal: She exhibits no edema.  Lymphadenopathy:    She has no cervical adenopathy.  Neurological: She is alert. Coordination normal.  Skin: Skin is warm and dry. No rash noted. She is not diaphoretic. No erythema.  Psychiatric: She has a normal mood and affect. Her behavior is normal.  Nursing note and vitals reviewed.   ED Course  Procedures  CRITICAL CARE Performed by: Anselmo Rod Total critical care time: 20 minutes Critical care time was exclusive of separately billable procedures and treating other patients. Critical care was necessary to treat or prevent imminent or life-threatening deterioration. Critical care was time spent personally by me on  the following activities: development of treatment plan with patient and/or surrogate as well as nursing, discussions with consultants, evaluation of patient's response to treatment, examination of patient, obtaining history from patient or surrogate, ordering and performing treatments and interventions, ordering and review of laboratory studies, ordering and review of radiographic studies, pulse oximetry and re-evaluation of patient's condition.   Labs Review Labs Reviewed  CULTURE, BLOOD (ROUTINE X 2)  CULTURE, BLOOD (ROUTINE X 2)  CBC WITH DIFFERENTIAL/PLATELET  BASIC METABOLIC PANEL  INFLUENZA PANEL BY PCR (TYPE A & B, H1N1)  I-STAT CG4 LACTIC ACID, ED  I-STAT ARTERIAL BLOOD GAS, ED   Imaging Review Dg Chest 2 View  01/03/2016  CLINICAL DATA:  Productive cough for 2 days EXAM: CHEST  2 VIEW COMPARISON:  12/16/2015 FINDINGS: Cardiac shadow is within normal limits. The lungs are well aerated bilaterally. Bibasilar scarring is again identified and stable from the prior exam. No new focal infiltrate is seen. No bony abnormality is noted. IMPRESSION: No acute abnormality seen. Electronically Signed   By: Alcide Clever M.D.    On: 01/03/2016 11:10   I have personally reviewed and evaluated these images and lab results as part of my medical decision-making.   EKG Interpretation   Date/Time:  Saturday January 03 2016 09:58:58 EST Ventricular Rate:  120 PR Interval:  140 QRS Duration: 90 QT Interval:  316 QTC Calculation: 446 R Axis:   99 Text Interpretation:  Sinus tachycardia LAE, consider biatrial enlargement  Borderline right axis deviation Low voltage, precordial leads Baseline  wander in lead(s) I III aVL V5 SINCE LAST TRACING HEART RATE HAS INCREASED  Confirmed by Ethelda Chick  MD, SAM ([id number]) on 01/03/2016 10:05:05 AM      MDM   Final diagnoses:  Acute respiratory failure, unspecified whether with hypoxia or hypercapnia (HCC)   Rectal temp 100.4, tachypnea, tachycardic. Patient meets SIRS criteria, most likely pulmonary source. Most recent hospitalization was 2 weeks ago. Patient will be treated for HCAP.  CXR negative, no white count. BMP with CO2 37, hyperglycemic at 156.  EKG with tachycardia, otherwise unremarkable for acute change. ABG pH 7.2, PCO2 82.2, PO2 58, HCO3 35.1. Bipap initiated. Will hold off on intubation at this time as patient is able to speak in full sentences.  Dr. Malachi Bonds agrees to admission to stepdown.   Melton Krebs, PA-C 01/04/16 [id number]  Doug Sou, MD 01/04/16 [phone]

## 2016-01-03 NOTE — ED Notes (Signed)
Respiratory called and en route to assess patient.

## 2016-01-03 NOTE — ED Notes (Signed)
Pt presents via POV with c/o SOB with productive cough x1-2 days - hx COPD and CHF and states she has been to the hospital 4 times in 8 weeks for the same.  SpO2 is 83% on 5L, speaking in short sentences. EDPA at bedside.

## 2016-01-03 NOTE — Progress Notes (Signed)
Attempted to call for report 3 times but unable to reached her.  Phone number left with the secretary to call me if Mary [name] is available.

## 2016-01-03 NOTE — Progress Notes (Signed)
RT Note: RT was called by RN, Janna Arch because patient was delivered a food tray and is wanting to eat. Patient was asking to be taken off of her Bipap so that she can eat so RT came to asess the patient. She seems to be tolerating it well and does not look like she is in any acute distress. Patients RN, Janna Arch is calling the physician now to see if they are okay with [place] taking her off so she can eat, if they want a repeat gas first, or if they want her to remain on it. Awaiting a response from the MD currently. Rt will continue to monitor.

## 2016-01-03 NOTE — H&P (Signed)
[facility] History and Physical  Mary [name] Mary [name]:[id number] DOB: 11-Dec-1975 DOA: 01/03/2016  Referring physician:  PCP: Elvina Sidle, MD   Chief Complaint: Acute respiratory failure   HPI: Mary [name] is a 40 y.o. female recently rehospitalized with respiratory failure, discharged on 12/16/15 in stable condition now presented with increasing shortness of breath. She is on Bipap now, history is limited.  Symptoms began about 2 days ago with light productive cough, complicated with dyspnea at rest and wheezing this morning. She attributes these symptoms to having been exposed to bronchitis from husband. Cough does not improve with change in position. She denies any fevers or chills. Denies myalgia. She does have night sweats. She denies any chest pain or palpitations. She has some nausea due to mucus production. Denies any vomiting. Reports moderate diaphragmatic pain due to forceful coughing. Denies any lower extremity pain or swelling. She does report chronic headaches without vision changes or confusion. At the ED, she was placed on BiPAP. She has low-grade fever of 99.3 and is tachycardic . Chest x ray negative for acute pulmonary disease. Recent CT angio on 12/16/15 was negative for PE  CBG showed PH is 7.23, PCO2 of 83, pO 59 bicarbonate 35.1 total CO2 38 and O2 sats 93. Repeat ABGs show slight improvement.   Review of Systems:  Constitutional:  No weight loss, + night sweats, +low grade fevers, +chills, + fatigue.  HEENT:  + headaches, denies Difficulty swallowing,Tooth/dental problems, Sore throat,  No sneezing, itching, ear ache, nasal congestion, or post nasal drip Cardio-vascular:  No chest pain, Orthopnea, PND, swelling in lower extremities, anasarca, dizziness, palpitations  GI:  No heartburn, indigestion, abdominal pain, nausea, vomiting, diarrhea, change in bowel habits, loss of appetite  Resp:  No shortness of breath with exertion or at rest. No excess mucus,  no productive cough, No non-productive cough, No coughing up of blood.No change in color of mucus.No wheezing.No chest wall deformity  Skin:  no rash or lesions.  GU:  no dysuria, change in color of urine, no urgency or frequency. No flank pain.  Musculoskeletal:  No joint pain or swelling. No decreased range of motion. No back pain.  Psych:  No change in mood or affect. No depression or anxiety. No memory loss.   Past Medical History  Diagnosis Date  . COPD (chronic obstructive pulmonary disease) (HCC)     PFTS 03/08/12: fev1 0.58L/1%, FVC 1.18/33%, Ratop 49 and c/w ssevere obstruction. 21% BD response on FVC, RV 219%, DLCO 11/54%  . History of alcoholism (HCC)   . Irregular menses   . DVT of upper extremity (deep vein thrombosis) [facility]) April 2013    right subclavian // Unclear precipitating cause - possibly significant right heart failure, with vascular stasis potentially predisposing to clotting.    . Pulmonary embolus [facility]) April 2013    Precipitating cause unclear. Was treated with coumadin from April-June 2013.  . Moderate to severe pulmonary hypertension [facility]) April 2013    Cardiac cath on 03/06/12 - 1. Elevated pulmonary artery pressures, right sided filling pressures.,  2. PA: 64/45 (mean 53)    . Hepatic cirrhosis (HCC)     Questionable history of - Noted on CT abdomen (03/2012) - thought to be due to vascular congestion from right heart failure +/- patient's history of alcohol abuse  . Axillary adenopathy     right axillary adenopathy noted on CT chest (03/06/2012)  . Periodontal disease   . Chronic right-sided CHF (congestive heart failure) (HCC) 03/23/2012  with cor pulmonale. Last RHC 03/2012  . Pneumonia ~ 1985; 01/2011  . History of chronic bronchitis   . Exertional shortness of breath   . On home oxygen therapy     "2-3 L 24/7" (05/03/2013)  . OSA (obstructive sleep apnea)     wears noctural BiPAP (05/03/2013)  . Migraine     "~ 1/yr" (05/03/2013)   Past Surgical  History  Procedure Laterality Date  . Cardiac surgery  10-22-1977    "my heart was backwards" (05/03/2013)  . Lung surgery  01-18-77  . Appendectomy  05/03/2013  . Cardiac catheterization  03/2012  . Laparoscopic appendectomy N/A 05/03/2013    Procedure: APPENDECTOMY LAPAROSCOPIC;  Surgeon: Cherylynn Ridges, MD;  Location: [facility] OR;  Service: General;  Laterality: N/A;  . Right heart catheterization N/A 03/07/2012    Procedure: RIGHT HEART CATH;  Surgeon: Kathleene Hazel, MD;  Location: [facility] CATH LAB;  Service: Cardiovascular;  Laterality: N/A;   Social History:  reports that she quit smoking about 3 years ago. Her smoking use included Cigarettes. She has a 25 pack-year smoking history. She has never used smokeless tobacco. She reports that she does not drink alcohol or use illicit drugs.  No Known Allergies  Family History  Problem Relation Age of Onset  . Hypertension Father   . Heart disease Father     CHF; died age 66   . Alcohol abuse Father   . Other Brother     died age 53 y.o overdose    Prior to Admission medications   Medication Sig Start Date End Date Taking? Authorizing Provider  albuterol (PROVENTIL HFA;VENTOLIN HFA) 108 (90 BASE) MCG/ACT inhaler Inhale 2 puffs into the lungs every 6 (six) hours as needed for wheezing or shortness of breath. Patient taking differently: Inhale 2 puffs into the lungs 4 (four) times daily.  09/06/14  Yes Kalman Shan, MD  docusate sodium (COLACE) 100 MG capsule Take 2 capsules (200 mg total) by mouth at bedtime. 11/17/15  Yes Calvert Cantor, MD  Fluticasone-Salmeterol (ADVAIR) 250-50 MCG/DOSE AEPB Inhale 1 puff into the lungs every 12 (twelve) hours. 09/27/14  Yes Kalman Shan, MD  polyethylene glycol (MIRALAX / GLYCOLAX) packet Take 17 g by mouth daily. Patient taking differently: Take 17 g by mouth daily as needed for mild constipation.  11/17/15  Yes Calvert Cantor, MD  predniSONE (DELTASONE) 10 MG tablet Label  & dispense according to  the schedule below. 4 Pills PO daily for a week , then3 Pills PO daily for a week, 2 Pills PO daily for a week, 1 Pill PO daily for a week, then stop. 12/19/15  Yes Albertine Grates, MD  SPIRIVA HANDIHALER 18 MCG inhalation capsule PLACE 1 CAPSULE (18 MCG TOTAL) INTO INHALER AND INHALE DAILY. 10/06/15  Yes Kalman Shan, MD  torsemide (DEMADEX) 20 MG tablet Take 1 tablet (20 mg total) by mouth every Monday, Wednesday, and Friday. 12/19/15  Yes Albertine Grates, MD  OXYGEN Inhale 2 L into the lungs continuous.    Historical Provider, MD  simethicone (GAS-X) 80 MG chewable tablet Chew 1 tablet (80 mg total) by mouth every 6 (six) hours as needed for flatulence. Patient not taking: Reported on 12/23/2015 11/17/15   Calvert Cantor, MD   Physical Exam: Filed Vitals:   01/03/16 1318 01/03/16 1330 01/03/16 1400 01/03/16 1415  BP:  108/80 103/62 100/70  Pulse:  107 98 97  Temp:      TempSrc:      Resp:  24  21 26  Height:      Weight:      SpO2: 97% 92% 97% 93%    Wt Readings from Last 3 Encounters:  01/03/16 92.534 kg (204 lb)  12/23/15 92.534 kg (204 lb)  12/19/15 92.579 kg (204 lb 1.6 oz)    General:  Appears calm and comfortable Eyes: PERRL, normal lids, irises & conjunctiva ENT: grossly normal hearing, lips & tongue Neck: no LAD, masses or thyromegaly Cardiovascular: RRR, no m/r/g. No LE edema. Telemetry: SR, no arrhythmias  Respiratory: CTA bilaterally, no w/r/r. Normal respiratory effort. Abdomen: soft, ntnd Skin: no rash or induration seen on limited exam Musculoskeletal: grossly normal tone BUE/BLE Psychiatric: grossly normal mood and affect, speech fluent and appropriate Neurologic: grossly non-focal.          Labs on Admission:  Basic Metabolic Panel:  Recent Labs Lab 01/03/16 1024  NA 138  K 4.2  CL 90*  CO2 37*  GLUCOSE 156*  BUN 10  CREATININE 0.70  CALCIUM 8.9   Liver Function Tests: No results for input(s): AST, ALT, ALKPHOS, BILITOT, PROT, ALBUMIN in the last 168  hours. No results for input(s): LIPASE, AMYLASE in the last 168 hours. No results for input(s): AMMONIA in the last 168 hours. CBC:  Recent Labs Lab 01/03/16 1024  WBC 10.1  NEUTROABS 7.9*  HGB 13.8  HCT 43.4  MCV 101.4*  PLT 129*   Cardiac Enzymes: No results for input(s): CKTOTAL, CKMB, CKMBINDEX, TROPONINI in the last 168 hours.  BNP (last 3 results)  Recent Labs  01/13/15 0049 12/19/15 0440  BNP 86.5 192.9*    ProBNP (last 3 results) No results for input(s): PROBNP in the last 8760 hours.  CBG: No results for input(s): GLUCAP in the last 168 hours.  Radiological Exams on Admission: Dg Chest 2 View  01/03/2016  CLINICAL DATA:  Productive cough for 2 days EXAM: CHEST  2 VIEW COMPARISON:  12/16/2015 FINDINGS: Cardiac shadow is within normal limits. The lungs are well aerated bilaterally. Bibasilar scarring is again identified and stable from the prior exam. No new focal infiltrate is seen. No bony abnormality is noted. IMPRESSION: No acute abnormality seen. Electronically Signed   By: Alcide Clever M.D.   On: 01/03/2016 11:10    EKG: Sinus tachycardia LAE, consider biatrial enlargement Borderline right axis deviation Low voltage, precordial leads Baseline wander in lead(s) I III aVL V5 SINCE LAST TRACING HEART RATE HAS INCREASED Confirmed by Ethelda Chick MD, SAM [phone]) on 01/03/2016 10:05:05 AM  Assessment/Plan Principal Problem:   Respiratory failure (HCC) Active Problems:   OSA on BiPAP   COPD exacerbation (HCC)   Acute respiratory failure (HCC)   OSA treated with BiPAP   Acute hypoxic respiratory failure likely secondary to acute COPD exacerbation/low suspicion for sepsis   Admit to step down -  Solumedrol  q 6 hrs -  Budesonide and brovana BID -  Duoneb q4h with albuterol q2h prn -  IV Zosyn  per pharmacy -  Maximize PPI therapy     - Wean oxygen as tolerated. Repeat ABGs are not worse   Sputum culture,     Blood sugar monitor  daily   OSA Continue BiPAP for now  Code Status:  Full Code DVT Prophylaxis: On Lovenox  Family Communication:   Disposition Plan: Thomasenia Bottoms [facility] Pager [phone]-

## 2016-01-03 NOTE — Progress Notes (Signed)
RT was not able to get ABG. Per PA we were only to stick once. RT will continue to monitor.

## 2016-01-03 NOTE — ED Notes (Signed)
Pt requested bedside commode.

## 2016-01-03 NOTE — Progress Notes (Signed)
Pharmacy Code Sepsis Protocol  Time of code sepsis page: 1031  Antibiotics delivered at 1109   Were antibiotics ordered at the time of the code sepsis page? No Was it required to contact the physician?  Physician not contacted  Physician contacted to order antibiotics for code sepsis  Physician contacted to recommend changing antibiotics  Pharmacy consulted for: Zosyn  Anti-infectives    None        Nurse education provided:  Minutes left to administer antibiotics to achieve 1 hour goal: 22  Correct order of antibiotic administration  Antibiotic Y-site compatibilities     Vinnie Level, PharmD., BCPS Clinical Pharmacist Phone [phone]

## 2016-01-03 NOTE — Progress Notes (Signed)
Patient transported to room 2C03 from ED by RT and RN without any apparent complications.

## 2016-01-03 NOTE — ED Notes (Signed)
Contacted Respiratory to notify of need for repeat ABG in 10 minutes.

## 2016-01-03 NOTE — ED Notes (Signed)
Report attempted 

## 2016-01-03 NOTE — Progress Notes (Signed)
ANTIBIOTIC CONSULT NOTE - INITIAL  Pharmacy Consult for Zosyn Indication: Sepsis likely HCAP   No Known Allergies  Patient Measurements: Height: 5' 2.5" (158.8 cm) Weight: 204 lb (92.534 kg) IBW/kg (Calculated) : 51.25  Vital Signs: Temp: 100.4 F (38 C) (01/28 1017) Temp Source: Rectal (01/28 1017) BP: 113/78 mmHg (01/28 1045) Pulse Rate: 112 (01/28 1045) Intake/Output from previous day:   Intake/Output from this shift:    Labs:  Recent Labs  01/03/16 1024  CREATININE 0.70   Estimated Creatinine Clearance: 102.1 mL/min (by C-G formula based on Cr of 0.7). No results for input(s): VANCOTROUGH, VANCOPEAK, VANCORANDOM, GENTTROUGH, GENTPEAK, GENTRANDOM, TOBRATROUGH, TOBRAPEAK, TOBRARND, AMIKACINPEAK, AMIKACINTROU, AMIKACIN in the last 72 hours.   Microbiology: Recent Results (from the past 720 hour(s))  MRSA PCR Screening     Status: None   Collection Time: 12/17/15  1:33 PM  Result Value Ref Range Status   MRSA by PCR NEGATIVE NEGATIVE Final    Comment:        The GeneXpert MRSA Assay (FDA approved for NASAL specimens only), is one component of a comprehensive MRSA colonization surveillance program. It is not intended to diagnose MRSA infection nor to guide or monitor treatment for MRSA infections.   Culture, expectorated sputum-assessment     Status: None   Collection Time: 12/18/15  8:04 AM  Result Value Ref Range Status   Specimen Description SPUTUM  Final   Special Requests NONE  Final   Sputum evaluation   Final    THIS SPECIMEN IS ACCEPTABLE. RESPIRATORY CULTURE REPORT TO FOLLOW.   Report Status 12/18/2015 FINAL  Final  Culture, respiratory (NON-Expectorated)     Status: None   Collection Time: 12/18/15  8:04 AM  Result Value Ref Range Status   Specimen Description SPUTUM  Final   Special Requests NONE  Final   Gram Stain   Final    MODERATE WBC PRESENT, PREDOMINANTLY PMN FEW SQUAMOUS EPITHELIAL CELLS PRESENT ABUNDANT GRAM POSITIVE COCCI IN PAIRS  IN CHAINS FEW GRAM POSITIVE RODS RARE GRAM NEGATIVE RODS    Culture   Final    NORMAL OROPHARYNGEAL FLORA Performed at [facility]    Report Status 12/20/2015 FINAL  Final    Medical History: Past Medical History  Diagnosis Date  . COPD (chronic obstructive pulmonary disease) (HCC)     PFTS 03/08/12: fev1 0.58L/1%, FVC 1.18/33%, Ratop 49 and c/w ssevere obstruction. 21% BD response on FVC, RV 219%, DLCO 11/54%  . History of alcoholism (HCC)   . Irregular menses   . DVT of upper extremity (deep vein thrombosis) [facility]) April 2013    right subclavian // Unclear precipitating cause - possibly significant right heart failure, with vascular stasis potentially predisposing to clotting.    . Pulmonary embolus [facility]) April 2013    Precipitating cause unclear. Was treated with coumadin from April-June 2013.  . Moderate to severe pulmonary hypertension [facility]) April 2013    Cardiac cath on 03/06/12 - 1. Elevated pulmonary artery pressures, right sided filling pressures.,  2. PA: 64/45 (mean 53)    . Hepatic cirrhosis (HCC)     Questionable history of - Noted on CT abdomen (03/2012) - thought to be due to vascular congestion from right heart failure +/- patient's history of alcohol abuse  . Axillary adenopathy     right axillary adenopathy noted on CT chest (03/06/2012)  . Periodontal disease   . Chronic right-sided CHF (congestive heart failure) (HCC) 03/23/2012    with cor pulmonale. Last RHC  03/2012  . Pneumonia ~ 1985; 01/2011  . History of chronic bronchitis   . Exertional shortness of breath   . On home oxygen therapy     "2-3 L 24/7" (05/03/2013)  . OSA (obstructive sleep apnea)     wears noctural BiPAP (05/03/2013)  . Migraine     "~ 1/yr" (05/03/2013)    Medications:   (Not in a hospital admission) Assessment: 2 YOF with 2 day history of worsening SOB and productive cough. PMH significant for COPD, PE, DVT, and CHF. Patient is tachycardic, temp 100.33F, tachypnic. Meets SIRS  criteria. She was recently treated with Levaquin for COPD exacerbation. Pharmacy consulted to start IV zosyn for empiric coverage of Sepsis likely HCAP. WBC pending. LA < 2. CrCL > 100 mL/min   1/28 BCx2>>  Goal of Therapy:  Resolution of infection   Plan:  -Given Zosyn 3.375 gm IV once as a 30 minute infusion followed by extended interval Zosyn 3.375 gm IV Q 8 hours -Monitor CBC, renal fx, cultures and clinical progress   Vinnie Level, PharmD., BCPS Clinical Pharmacist Phone [phone]

## 2016-01-04 DIAGNOSIS — J9622 Acute and chronic respiratory failure with hypercapnia: Secondary | ICD-10-CM

## 2016-01-04 DIAGNOSIS — E8729 Other acidosis: Secondary | ICD-10-CM | POA: Diagnosis present

## 2016-01-04 DIAGNOSIS — A419 Sepsis, unspecified organism: Secondary | ICD-10-CM

## 2016-01-04 DIAGNOSIS — E872 Acidosis: Secondary | ICD-10-CM

## 2016-01-04 DIAGNOSIS — J9621 Acute and chronic respiratory failure with hypoxia: Secondary | ICD-10-CM | POA: Diagnosis present

## 2016-01-04 DIAGNOSIS — J189 Pneumonia, unspecified organism: Secondary | ICD-10-CM

## 2016-01-04 LAB — BLOOD GAS, ARTERIAL
Acid-Base Excess: 10.1 mmol/L — ABNORMAL HIGH (ref 0.0–2.0)
BICARBONATE: 36.6 meq/L — AB (ref 20.0–24.0)
DELIVERY SYSTEMS: POSITIVE
Drawn by: 105521
FIO2: 0.4
LHR: 16 {breaths}/min
Mode: POSITIVE
O2 Saturation: 96.5 %
PATIENT TEMPERATURE: 98.6
PCO2 ART: 76.6 mmHg — AB (ref 35.0–45.0)
PEEP/CPAP: 7 cmH2O
Pressure control: 7 cmH2O
TCO2: 39 mmol/L (ref 0–100)
pH, Arterial: 7.301 — ABNORMAL LOW (ref 7.350–7.450)
pO2, Arterial: 87.6 mmHg (ref 80.0–100.0)

## 2016-01-04 LAB — CBC WITH DIFFERENTIAL/PLATELET
BASOS ABS: 0 10*3/uL (ref 0.0–0.1)
BASOS PCT: 0 %
Eosinophils Absolute: 0 10*3/uL (ref 0.0–0.7)
Eosinophils Relative: 0 %
HEMATOCRIT: 43.1 % (ref 36.0–46.0)
HEMOGLOBIN: 13.4 g/dL (ref 12.0–15.0)
Lymphocytes Relative: 9 %
Lymphs Abs: 0.7 10*3/uL (ref 0.7–4.0)
MCH: 31.8 pg (ref 26.0–34.0)
MCHC: 31.1 g/dL (ref 30.0–36.0)
MCV: 102.1 fL — ABNORMAL HIGH (ref 78.0–100.0)
MONO ABS: 0.1 10*3/uL (ref 0.1–1.0)
Monocytes Relative: 1 %
NEUTROS ABS: 6.9 10*3/uL (ref 1.7–7.7)
NEUTROS PCT: 91 %
Platelets: 115 10*3/uL — ABNORMAL LOW (ref 150–400)
RBC: 4.22 MIL/uL (ref 3.87–5.11)
RDW: 15.3 % (ref 11.5–15.5)
WBC: 7.6 10*3/uL (ref 4.0–10.5)

## 2016-01-04 LAB — BASIC METABOLIC PANEL
ANION GAP: 5 (ref 5–15)
BUN: <5 mg/dL — ABNORMAL LOW (ref 6–20)
CALCIUM: 8.3 mg/dL — AB (ref 8.9–10.3)
CO2: 35 mmol/L — ABNORMAL HIGH (ref 22–32)
Chloride: 98 mmol/L — ABNORMAL LOW (ref 101–111)
Creatinine, Ser: 0.49 mg/dL (ref 0.44–1.00)
GFR calc Af Amer: >60 mL/min (ref >60)
Glucose, Bld: 170 mg/dL — ABNORMAL HIGH (ref 65–99)
Potassium: 3.9 mmol/L (ref 3.5–5.1)
SODIUM: 138 mmol/L (ref 135–145)

## 2016-01-04 MED ORDER — METHYLPREDNISOLONE SODIUM SUCC 125 MG IJ SOLR
60.0000 mg | Freq: Two times a day (BID) | INTRAMUSCULAR | Status: DC
  Administered 2016-01-05 (×2): 60 mg via INTRAVENOUS
  Filled 2016-01-04 (×2): qty 2

## 2016-01-04 MED ORDER — ACETAMINOPHEN 325 MG PO TABS
650.0000 mg | ORAL_TABLET | Freq: Four times a day (QID) | ORAL | Status: DC | PRN
  Administered 2016-01-04 – 2016-01-07 (×3): 650 mg via ORAL
  Filled 2016-01-04 (×3): qty 2

## 2016-01-04 MED ORDER — POLYETHYLENE GLYCOL 3350 17 G PO PACK
17.0000 g | PACK | Freq: Every day | ORAL | Status: DC
  Administered 2016-01-04 – 2016-01-09 (×3): 17 g via ORAL
  Filled 2016-01-04 (×5): qty 1

## 2016-01-04 MED ORDER — VANCOMYCIN HCL 10 G IV SOLR
1500.0000 mg | Freq: Two times a day (BID) | INTRAVENOUS | Status: AC
  Administered 2016-01-04 – 2016-01-05 (×3): 1500 mg via INTRAVENOUS
  Filled 2016-01-04 (×4): qty 1500

## 2016-01-04 MED ORDER — SODIUM CHLORIDE 0.9 % IV SOLN
INTRAVENOUS | Status: DC
  Administered 2016-01-04 – 2016-01-05 (×2): via INTRAVENOUS

## 2016-01-04 MED ORDER — DEXTROSE 5 % IV SOLN
2.0000 g | Freq: Two times a day (BID) | INTRAVENOUS | Status: AC
  Administered 2016-01-04 – 2016-01-05 (×3): 2 g via INTRAVENOUS
  Filled 2016-01-04 (×4): qty 2

## 2016-01-04 MED ORDER — HEPARIN SODIUM (PORCINE) 5000 UNIT/ML IJ SOLN
5000.0000 [IU] | Freq: Three times a day (TID) | INTRAMUSCULAR | Status: DC
  Administered 2016-01-04 – 2016-01-09 (×16): 5000 [IU] via SUBCUTANEOUS
  Filled 2016-01-04 (×16): qty 1

## 2016-01-04 MED ORDER — DM-GUAIFENESIN ER 30-600 MG PO TB12
1.0000 | ORAL_TABLET | Freq: Two times a day (BID) | ORAL | Status: DC
  Administered 2016-01-04 – 2016-01-09 (×11): 1 via ORAL
  Filled 2016-01-04 (×11): qty 1

## 2016-01-04 NOTE — Progress Notes (Signed)
ANTIBIOTIC CONSULT NOTE - INITIAL  Pharmacy Consult for Cefepime/vanc Indication: Sepsis likely HCAP   No Known Allergies  Patient Measurements: Height:  (157.5 cm) Weight: 208 lb 12.4 oz (94.7 kg) IBW/kg (Calculated) : 50.1  Vital Signs: Temp: 97.7 F (36.5 C) (01/29 1252) Temp Source: Oral (01/29 1252) BP: 111/65 mmHg (01/29 1252) Pulse Rate: 100 (01/29 1252) Intake/Output from previous day: 01/28 0701 - 01/29 0700 In: 3315 [P.O.:240; I.V.:2975; IV Piggyback:100] Out: 450 [Urine:450] Intake/Output from this shift: Total I/O In: 480 [P.O.:480] Out: 400 [Urine:400]  Labs:  Recent Labs  01/03/16 1024 01/04/16 0317 01/04/16 0910  WBC 10.1  --  7.6  HGB 13.8  --  13.4  PLT 129*  --  115*  CREATININE 0.70 0.49  --    Estimated Creatinine Clearance: 102.2 mL/min (by C-G formula based on Cr of 0.49). No results for input(s): VANCOTROUGH, VANCOPEAK, VANCORANDOM, GENTTROUGH, GENTPEAK, GENTRANDOM, TOBRATROUGH, TOBRAPEAK, TOBRARND, AMIKACINPEAK, AMIKACINTROU, AMIKACIN in the last 72 hours.   Microbiology: Recent Results (from the past 720 hour(s))  MRSA PCR Screening     Status: None   Collection Time: 12/17/15  1:33 PM  Result Value Ref Range Status   MRSA by PCR NEGATIVE NEGATIVE Final    Comment:        The GeneXpert MRSA Assay (FDA approved for NASAL specimens only), is one component of a comprehensive MRSA colonization surveillance program. It is not intended to diagnose MRSA infection nor to guide or monitor treatment for MRSA infections.   Culture, expectorated sputum-assessment     Status: None   Collection Time: 12/18/15  8:04 AM  Result Value Ref Range Status   Specimen Description SPUTUM  Final   Special Requests NONE  Final   Sputum evaluation   Final    THIS SPECIMEN IS ACCEPTABLE. RESPIRATORY CULTURE REPORT TO FOLLOW.   Report Status 12/18/2015 FINAL  Final  Culture, respiratory (NON-Expectorated)     Status: None   Collection Time:  12/18/15  8:04 AM  Result Value Ref Range Status   Specimen Description SPUTUM  Final   Special Requests NONE  Final   Gram Stain   Final    MODERATE WBC PRESENT, PREDOMINANTLY PMN FEW SQUAMOUS EPITHELIAL CELLS PRESENT ABUNDANT GRAM POSITIVE COCCI IN PAIRS IN CHAINS FEW GRAM POSITIVE RODS RARE GRAM NEGATIVE RODS    Culture   Final    NORMAL OROPHARYNGEAL FLORA Performed at [facility]    Report Status 12/20/2015 FINAL  Final  Blood Culture (routine x 2)     Status: None (Preliminary result)   Collection Time: 01/03/16 10:36 AM  Result Value Ref Range Status   Specimen Description BLOOD RIGHT ANTECUBITAL  Final   Special Requests BOTTLES DRAWN AEROBIC AND ANAEROBIC 10CC  Final   Culture NO GROWTH 1 DAY  Final   Report Status PENDING  Incomplete  Blood Culture (routine x 2)     Status: None (Preliminary result)   Collection Time: 01/03/16 10:40 AM  Result Value Ref Range Status   Specimen Description BLOOD LEFT ANTECUBITAL  Final   Special Requests BOTTLES DRAWN AEROBIC ONLY 10CC  Final   Culture NO GROWTH 1 DAY  Final   Report Status PENDING  Incomplete  MRSA PCR Screening     Status: None   Collection Time: 01/03/16  5:40 PM  Result Value Ref Range Status   MRSA by PCR NEGATIVE NEGATIVE Final    Comment:        The GeneXpert MRSA  Assay (FDA approved for NASAL specimens only), is one component of a comprehensive MRSA colonization surveillance program. It is not intended to diagnose MRSA infection nor to guide or monitor treatment for MRSA infections.     Medical History: Past Medical History  Diagnosis Date  . COPD (chronic obstructive pulmonary disease) (HCC)     PFTS 03/08/12: fev1 0.58L/1%, FVC 1.18/33%, Ratop 49 and c/w ssevere obstruction. 21% BD response on FVC, RV 219%, DLCO 11/54%  . History of alcoholism (HCC)   . Irregular menses   . DVT of upper extremity (deep vein thrombosis) [facility]) April 2013    right subclavian // Unclear precipitating cause  - possibly significant right heart failure, with vascular stasis potentially predisposing to clotting.    . Pulmonary embolus [facility]) April 2013    Precipitating cause unclear. Was treated with coumadin from April-June 2013.  . Moderate to severe pulmonary hypertension [facility]) April 2013    Cardiac cath on 03/06/12 - 1. Elevated pulmonary artery pressures, right sided filling pressures.,  2. PA: 64/45 (mean 53)    . Hepatic cirrhosis (HCC)     Questionable history of - Noted on CT abdomen (03/2012) - thought to be due to vascular congestion from right heart failure +/- patient's history of alcohol abuse  . Axillary adenopathy     right axillary adenopathy noted on CT chest (03/06/2012)  . Periodontal disease   . Chronic right-sided CHF (congestive heart failure) (HCC) 03/23/2012    with cor pulmonale. Last RHC 03/2012  . Pneumonia ~ 1985; 01/2011  . History of chronic bronchitis   . Exertional shortness of breath   . On home oxygen therapy     "2-3 L 24/7" (05/03/2013)  . OSA (obstructive sleep apnea)     wears noctural BiPAP (05/03/2013)  . Migraine     "~ 1/yr" (05/03/2013)    Medications:  Prescriptions prior to admission  Medication Sig Dispense Refill Last Dose  . albuterol (PROVENTIL HFA;VENTOLIN HFA) 108 (90 BASE) MCG/ACT inhaler Inhale 2 puffs into the lungs every 6 (six) hours as needed for wheezing or shortness of breath. (Patient taking differently: Inhale 2 puffs into the lungs 4 (four) times daily. ) 8.5 g 1 Past Week at Unknown time  . docusate sodium (COLACE) 100 MG capsule Take 2 capsules (200 mg total) by mouth at bedtime. 10 capsule 0 01/02/2016 at Unknown time  . Fluticasone-Salmeterol (ADVAIR) 250-50 MCG/DOSE AEPB Inhale 1 puff into the lungs every 12 (twelve) hours. 60 each 6 01/02/2016 at Unknown time  . polyethylene glycol (MIRALAX / GLYCOLAX) packet Take 17 g by mouth daily. (Patient taking differently: Take 17 g by mouth daily as needed for mild constipation. ) 14 each 0  unknown at Unknown time  . predniSONE (DELTASONE) 10 MG tablet Label  & dispense according to the schedule below. 4 Pills PO daily for a week , then3 Pills PO daily for a week, 2 Pills PO daily for a week, 1 Pill PO daily for a week, then stop. 70 tablet 0 01/02/2016 at Unknown time  . SPIRIVA HANDIHALER 18 MCG inhalation capsule PLACE 1 CAPSULE (18 MCG TOTAL) INTO INHALER AND INHALE DAILY. 30 capsule 2 01/02/2016 at Unknown time  . torsemide (DEMADEX) 20 MG tablet Take 1 tablet (20 mg total) by mouth every Monday, Wednesday, and Friday. 90 tablet 3 01/02/2016 at Unknown time  . OXYGEN Inhale 2 L into the lungs continuous.   Taking  . simethicone (GAS-X) 80 MG chewable tablet Chew 1  tablet (80 mg total) by mouth every 6 (six) hours as needed for flatulence. (Patient not taking: Reported on 12/23/2015) 30 tablet 0 Not Taking   Assessment: 41 YOF with 2 day history of worsening SOB and productive cough. PMH significant for COPD, PE, DVT, and CHF. Patient is tachycardic, temp 100.64F, tachypnic. Meets SIRS criteria. She was recently treated with Levaquin for COPD exacerbation. ABX is now changed to cefepime/vanc. WBC pending. LA < 2. CrCL > 100 mL/min   1/28 BCx2>>  Goal of Therapy:  Vanc trough - 15-20  Plan:   Cefepime 2g IV q12 Vanc 1.5g IV q12 Level as needed  Ulyses Southward, PharmD Pager: [phone] 01/04/2016 3:51 PM

## 2016-01-04 NOTE — Progress Notes (Signed)
[place] TEAM 1 - Stepdown/ICU TEAM Progress Note  UMI MAINOR ZOX:[id number] DOB: 08/26/1976 DOA: 01/03/2016 PCP: Robyn Haber, MD  Admit HPI / Brief Narrative: 40 y.o. WF PMHx COPD, Moderate to Severe Pulmonary Hypertension on home oxygen 2-3 L, OSA, S/P lung surgery, Chronic Rt-sided CHF with Cor Pulmonale, DVT Rt Subclavian, PE, Alcohol Abuse,   Most recently rehospitalized with respiratory failure, discharged on 12/16/15 in stable condition now presented with increasing shortness of breath. She is on Bipap now, history is limited. Symptoms began about 2 days ago with light productive cough, complicated with dyspnea at rest and wheezing this morning. She attributes these symptoms to having been exposed to bronchitis from husband. Cough does not improve with change in position. She denies any fevers or chills. Denies myalgia. She does have night sweats. She denies any chest pain or palpitations. She has some nausea due to mucus production. Denies any vomiting. Reports moderate diaphragmatic pain due to forceful coughing. Denies any lower extremity pain or swelling. She does report chronic headaches without vision changes or confusion. At the ED, she was placed on BiPAP. She has low-grade fever of 99.3 and is tachycardic . Chest x ray negative for acute pulmonary disease. Recent CT angio on 12/16/15 was negative for PE CBG showed PH is 7.23, PCO2 of 83, pO 59 bicarbonate 35.1 total CO2 38 and O2 sats 93. Repeat ABGs show slight improvement.   HPI/Subjective: 1/29 A/O 4, acute on chronic respiratory distress. States stopped smoking 3 years ago however husband continues to smoke. States husband sick with bronchitis being treated with antibiotics and steroids. States last couple days became increasingly short of breath to the point no longer could lay down to sleep even with BiPAP on therefore came to ED. Unknown BiPAP settings. States uses 2 L O2 during the daytime     Assessment/Plan:  Sepsis/Acute  on chronic respiratory failure with hypoxia and hypercapnia/COPD exacerbation/ HCAP -Patient met sepsis criteria HR> 90, Temp= 38C, RR>20 -Continue current antibiotics for 7 day course - Solumedrol 60 mg q 6 hrs - Budesonide and brovana BID - Duoneb QID,  - Flutter valve -Mucinex DM -Titrate O2 to maintain SPO2 89-93% -Strict in and out -Daily weight -Normal saline 52m/hr -Obtain updated echocardiogram to ensure no cardiac component -Patient will need to be discharged on steroids and titrated down slowly  Non-anion gap respiratory acidosis compensated -Unsure what patient's baseline CO2 level lives, but certain that she is trapping CO2    OSA/Obesity Hypoventilation Syndrome -Continue BiPAP at night   HTN -Hold all BP medication patient currently mildly hypotensive  Pulmonary hypertension -See HTN -Echocardiogram pending    Code Status: FULL Family Communication: no family present at time of exam Disposition Plan: Home    Consultants: NA  Procedure/Significant Events: NA   Culture 1/28 strep pneumo urine antigen negative 1/28 blood pending 1/28 MRSA by PCR negative 1/28 influenza A/B/H1N1 negative 1/29 respiratory virus panel pending 1/29 sputum pending   Antibiotics: Zosyn 1/28>>1/29 Cefepime 1/29>>  Vancomycin 1/29>>    DVT prophylaxis: Subcutaneous heparin   Devices NA   LINES / TUBES:  NA    Continuous Infusions: . sodium chloride      Objective: VITAL SIGNS: Temp: 97.7 F (36.5 C) (01/29 1252) Temp Source: Oral (01/29 1252) BP: 111/65 mmHg (01/29 1252) Pulse Rate: 100 (01/29 1252) SPO2; FIO2:   Intake/Output Summary (Last 24 hours) at 01/04/16 1555 Last data filed at 01/04/16 1500  Gross per 24 hour  Intake  970 ml  Output    850 ml  Net    120 ml     Exam: General:  A/O 4, positive acute on chronic respiratory distress Eyes: Negative headache, negative scleral  hemorrhage ENT: Negative Runny nose, negative gingival bleeding, Neck:  Negative scars, masses, torticollis, lymphadenopathy, JVD Lungs:  diffuse poor air movement throughout, Right lung field diffuse expiratory wheezes, negative crackles Cardiovascular:  tachycardic, Regular rhythm without murmur gallop or rub normal S1 and S2 Abdomen: morbidly obese,nondistended, positive soft, bowel sounds, no rebound, no ascites, no appreciable mass Extremities: No significant cyanosis, clubbing, or edema bilateral lower extremities Psychiatric:  Negative depression, negative anxiety, negative fatigue, negative mania  Neurologic:  Cranial nerves II through XII intact, tongue/uvula midline, all extremities muscle strength 5/5, sensation intact throughout, negative dysarthria, negative expressive aphasia, negative receptive aphasia.   Data Reviewed: Basic Metabolic Panel:  Recent Labs Lab 01/03/16 1024 01/04/16 0317  NA 138 138  K 4.2 3.9  CL 90* 98*  CO2 37* 35*  GLUCOSE 156* 170*  BUN 10 <5*  CREATININE 0.70 0.49  CALCIUM 8.9 8.3*   Liver Function Tests: No results for input(s): AST, ALT, ALKPHOS, BILITOT, PROT, ALBUMIN in the last 168 hours. No results for input(s): LIPASE, AMYLASE in the last 168 hours. No results for input(s): AMMONIA in the last 168 hours. CBC:  Recent Labs Lab 01/03/16 1024 01/04/16 0910  WBC 10.1 7.6  NEUTROABS 7.9* 6.9  HGB 13.8 13.4  HCT 43.4 43.1  MCV 101.4* 102.1*  PLT 129* 115*   Cardiac Enzymes: No results for input(s): CKTOTAL, CKMB, CKMBINDEX, TROPONINI in the last 168 hours. BNP (last 3 results)  Recent Labs  01/13/15 0049 12/19/15 0440  BNP 86.5 192.9*    ProBNP (last 3 results) No results for input(s): PROBNP in the last 8760 hours.  CBG: No results for input(s): GLUCAP in the last 168 hours.  Recent Results (from the past 240 hour(s))  Blood Culture (routine x 2)     Status: None (Preliminary result)   Collection Time: 01/03/16  10:36 AM  Result Value Ref Range Status   Specimen Description BLOOD RIGHT ANTECUBITAL  Final   Special Requests BOTTLES DRAWN AEROBIC AND ANAEROBIC 10CC  Final   Culture NO GROWTH 1 DAY  Final   Report Status PENDING  Incomplete  Blood Culture (routine x 2)     Status: None (Preliminary result)   Collection Time: 01/03/16 10:40 AM  Result Value Ref Range Status   Specimen Description BLOOD LEFT ANTECUBITAL  Final   Special Requests BOTTLES DRAWN AEROBIC ONLY 10CC  Final   Culture NO GROWTH 1 DAY  Final   Report Status PENDING  Incomplete  MRSA PCR Screening     Status: None   Collection Time: 01/03/16  5:40 PM  Result Value Ref Range Status   MRSA by PCR NEGATIVE NEGATIVE Final    Comment:        The GeneXpert MRSA Assay (FDA approved for NASAL specimens only), is one component of a comprehensive MRSA colonization surveillance program. It is not intended to diagnose MRSA infection nor to guide or monitor treatment for MRSA infections.      Studies:  Recent x-ray studies have been reviewed in detail by the Attending Physician  Scheduled Meds:  Scheduled Meds: . arformoterol  15 mcg Nebulization BID  . budesonide (PULMICORT) nebulizer solution  0.5 mg Nebulization BID  . ceFEPime (MAXIPIME) IV  2 g Intravenous Q12H  . dextromethorphan-guaiFENesin  1  tablet Oral BID  . heparin subcutaneous  5,000 Units Subcutaneous 3 times per day  . ipratropium-albuterol  3 mL Nebulization QID  . [START ON 01/05/2016] methylPREDNISolone (SOLU-MEDROL) injection  60 mg Intravenous Q12H  . pantoprazole  40 mg Oral Daily  . vancomycin  1,500 mg Intravenous Q12H    Time spent on care of this patient: 40 mins   WOODS, Geraldo Docker , MD  [facility] Office  [phone] Pager [phone]  On-Call/Text Page:      [email or web address]      password [email or web address]  If 7PM-7AM, please contact night-coverage [email or web address] Password [email or web address] 01/04/2016, 3:55 PM   LOS: 1 day   Care during the described  time interval was provided by me .  I have reviewed this patient's available data, including medical history, events of note, physical examination, and all test results as part of my evaluation. I have personally reviewed and interpreted all radiology studies.   Dia Crawford, MD [phone] Pager

## 2016-01-04 NOTE — Progress Notes (Signed)
CRITICAL VALUE ALERT  Critical value received:  ABG's : ph 7.30, CO2 77, PO2 88, Bicarb 37, BE +10  Date of notification:  01/04/2016   Time of notification:  0842  Critical value read back:Yes.    Nurse who received alert:  Burnett Harry, Rn  MD notified (1st page):  Dr. Joseph Art  Time of first page:  [phone]  MD notified (2nd page):  Time of second page:  Responding MD:  Dr. Joseph Art  Time MD responded:  [phone]

## 2016-01-05 ENCOUNTER — Inpatient Hospital Stay (HOSPITAL_COMMUNITY): Payer: 59

## 2016-01-05 DIAGNOSIS — J9621 Acute and chronic respiratory failure with hypoxia: Secondary | ICD-10-CM

## 2016-01-05 DIAGNOSIS — G4733 Obstructive sleep apnea (adult) (pediatric): Secondary | ICD-10-CM

## 2016-01-05 DIAGNOSIS — I272 Other secondary pulmonary hypertension: Secondary | ICD-10-CM

## 2016-01-05 DIAGNOSIS — J441 Chronic obstructive pulmonary disease with (acute) exacerbation: Secondary | ICD-10-CM

## 2016-01-05 DIAGNOSIS — J9622 Acute and chronic respiratory failure with hypercapnia: Secondary | ICD-10-CM

## 2016-01-05 LAB — BASIC METABOLIC PANEL
ANION GAP: 6 (ref 5–15)
BUN: 7 mg/dL (ref 6–20)
CALCIUM: 8.9 mg/dL (ref 8.9–10.3)
CHLORIDE: 98 mmol/L — AB (ref 101–111)
CO2: 33 mmol/L — AB (ref 22–32)
CREATININE: 0.58 mg/dL (ref 0.44–1.00)
GFR calc non Af Amer: >60 mL/min (ref >60)
Glucose, Bld: 232 mg/dL — ABNORMAL HIGH (ref 65–99)
Potassium: 4.3 mmol/L (ref 3.5–5.1)
SODIUM: 137 mmol/L (ref 135–145)

## 2016-01-05 LAB — EXPECTORATED SPUTUM ASSESSMENT W GRAM STAIN, RFLX TO RESP C

## 2016-01-05 LAB — EXPECTORATED SPUTUM ASSESSMENT W REFEX TO RESP CULTURE

## 2016-01-05 MED ORDER — KETOROLAC TROMETHAMINE 30 MG/ML IJ SOLN
30.0000 mg | Freq: Once | INTRAMUSCULAR | Status: AC
  Administered 2016-01-05: 30 mg via INTRAVENOUS
  Filled 2016-01-05: qty 1

## 2016-01-05 MED ORDER — FUROSEMIDE 10 MG/ML IJ SOLN
40.0000 mg | Freq: Two times a day (BID) | INTRAMUSCULAR | Status: DC
  Administered 2016-01-06: 40 mg via INTRAVENOUS
  Filled 2016-01-05: qty 4

## 2016-01-05 MED ORDER — DIPHENHYDRAMINE HCL 50 MG/ML IJ SOLN
25.0000 mg | Freq: Once | INTRAMUSCULAR | Status: AC
  Administered 2016-01-05: 25 mg via INTRAVENOUS
  Filled 2016-01-05: qty 1

## 2016-01-05 MED ORDER — METOCLOPRAMIDE HCL 5 MG/ML IJ SOLN
10.0000 mg | Freq: Once | INTRAMUSCULAR | Status: AC
  Administered 2016-01-05: 10 mg via INTRAVENOUS
  Filled 2016-01-05: qty 2

## 2016-01-05 MED ORDER — FUROSEMIDE 10 MG/ML IJ SOLN
40.0000 mg | Freq: Two times a day (BID) | INTRAMUSCULAR | Status: DC
  Administered 2016-01-05: 40 mg via INTRAVENOUS
  Filled 2016-01-05: qty 4

## 2016-01-05 MED ORDER — METOLAZONE 2.5 MG PO TABS
2.5000 mg | ORAL_TABLET | Freq: Every day | ORAL | Status: DC
  Administered 2016-01-06: 2.5 mg via ORAL
  Filled 2016-01-05 (×2): qty 1

## 2016-01-05 MED ORDER — HYDROCOD POLST-CPM POLST ER 10-8 MG/5ML PO SUER
5.0000 mL | Freq: Once | ORAL | Status: AC
  Administered 2016-01-05: 5 mL via ORAL
  Filled 2016-01-05: qty 5

## 2016-01-05 MED ORDER — TORSEMIDE 20 MG PO TABS: 20.0000 mg | ORAL_TABLET | ORAL | Status: DC

## 2016-01-05 NOTE — Consult Note (Signed)
Advanced Heart Failure Team Consult Note  Referring Physician: Dr Sharon Seller Primary Physician: Dr Elvina Sidle.   Reason for Consultation: Mod/Severe Pulm HTN, R-sided HF with cor pulmonale  HPI:    Mary [name] is a 40 y.o. female with history of morbid obesity, severe COPD, Moderate to Severe Pulmonary Hypertension on home oxygen 2-3 L, OSA, S/P lung surgery, Chronic Rt-sided CHF with Cor Pulmonale, DVT Rt Subclavian, PE, and Alcohol Abuse who reported to [facility] 01/03/16 for worsening SOB with dyspnea at rest, productive cough, and wheezing. She required Bipap and had low grade fever of 99.3 with tachycardia.   CXR negative for acute pulmonary disease. Had CTA on previous admission 12/16/15 negative for PE. ABG showed PH 7.23, PC02 of 83, p02 59, Bicarbonate 35.1, total CO2 38, and 02 sats 93. Subsequent ABGs with slight improvement on Bipap. Other pertinent admission labs included K 4.2, Creatinine 0.70, lactic acid 1.08, WBC 7.6.    Of note, she was scheduled for a visit in the HF clinic on 01/07/16. Consult was made with worsening disease and the fact that she will still be in the hospital at that time.   Has been feeling bad over the past month and a half.  Having trouble keeping oxygen up. Had low grade fever and increased sputum on admit, light green color.  Wears 2 liters O2 chronically at home.  Wears BiPAP at night. Quit smoking 3-4 years ago. SOB with minimal activity.   ECHO 09/12/14 LVEF 55-60%, PA peak pressure 13 mm Hg ECHO 02/28/13 LVEF 60%, RV mildly dilated ECHO 04/12/12 LVEF 55-60%, RV mod/severely dilated   PFT 04/10/12: fev1 0.54L/18%, 31% BD response, RATio 49, TL 88%, RV 204$, DLCO 30%,   RHC 03/07/12 Ao: (cuff) 111/84  LV: not performed RA: 23  RV: 59/12/25 PA: 64/45 (mean 53)  PCWP: 18 PVR: 7 Fick Cardiac Output: 5.0 L/min Fick Cardiac Index: 2.76 L/min/m2 Peripheral Aortic Saturation: 90% Pulmonary Artery Saturation:  67%  Review of Systems: [y] = yes, [ ]  = no   General: Weight gain [ ] ; Weight loss [ ] ; Anorexia [ ] ; Fatigue [ ] ; Fever [y]; Chills [ ] ; Weakness [y]  Cardiac: Chest pain/pressure [ ] ; Resting SOB [y]; Exertional SOB [y]; Orthopnea [ ] ; Pedal Edema [ ] ; Palpitations [ ] ; Syncope [ ] ; Presyncope [ ] ; Paroxysmal nocturnal dyspnea[ ]   Pulmonary: Cough [y]; Wheezing[ ] ; Hemoptysis[ ] ; Sputum [y]; Snoring [ ]   GI: Vomiting[ ] ; Dysphagia[ ] ; Melena[ ] ; Hematochezia [ ] ; Heartburn[ ] ; Abdominal pain [ ] ; Constipation [ ] ; Diarrhea [ ] ; BRBPR [ ]   GU: Hematuria[ ] ; Dysuria [ ] ; Nocturia[ ]   Vascular: Pain in legs with walking [ ] ; Pain in feet with lying flat [ ] ; Non-healing sores [ ] ; Stroke [ ] ; TIA [ ] ; Slurred speech [ ] ;  Neuro: Headaches[ ] ; Vertigo[ ] ; Seizures[ ] ; Paresthesias[ ] ;Blurred vision [ ] ; Diplopia [ ] ; Vision changes [ ]   Ortho/Skin: Arthritis [ ] ; Joint pain [ ] ; Muscle pain [ ] ; Joint swelling [ ] ; Back Pain [ ] ; Rash [ ]   Psych: Depression[ ] ; Anxiety[ ]   Heme: Bleeding problems [ ] ; Clotting disorders [ ] ; Anemia [ ]   Endocrine: Diabetes [ ] ; Thyroid dysfunction[ ]   Home Medications Prior to Admission medications   Medication Sig Start Date End Date Taking? Authorizing Provider  albuterol (PROVENTIL HFA;VENTOLIN HFA) 108 (90 BASE) MCG/ACT inhaler Inhale 2 puffs into the lungs every 6 (six) hours as needed for wheezing or shortness of  breath. Patient taking differently: Inhale 2 puffs into the lungs 4 (four) times daily.  09/06/14  Yes Kalman Shan, MD  docusate sodium (COLACE) 100 MG capsule Take 2 capsules (200 mg total) by mouth at bedtime. 11/17/15  Yes Calvert Cantor, MD  Fluticasone-Salmeterol (ADVAIR) 250-50 MCG/DOSE AEPB Inhale 1 puff into the lungs every 12 (twelve) hours. 09/27/14  Yes Kalman Shan, MD  polyethylene glycol (MIRALAX / GLYCOLAX) packet Take 17 g by mouth daily. Patient taking differently: Take 17 g by mouth daily as needed for mild constipation.   11/17/15  Yes Calvert Cantor, MD  predniSONE (DELTASONE) 10 MG tablet Label  & dispense according to the schedule below. 4 Pills PO daily for a week , then3 Pills PO daily for a week, 2 Pills PO daily for a week, 1 Pill PO daily for a week, then stop. 12/19/15  Yes Albertine Grates, MD  SPIRIVA HANDIHALER 18 MCG inhalation capsule PLACE 1 CAPSULE (18 MCG TOTAL) INTO INHALER AND INHALE DAILY. 10/06/15  Yes Kalman Shan, MD  torsemide (DEMADEX) 20 MG tablet Take 1 tablet (20 mg total) by mouth every Monday, Wednesday, and Friday. 12/19/15  Yes Albertine Grates, MD  OXYGEN Inhale 2 L into the lungs continuous.    Historical Provider, MD  simethicone (GAS-X) 80 MG chewable tablet Chew 1 tablet (80 mg total) by mouth every 6 (six) hours as needed for flatulence. Patient not taking: Reported on 12/23/2015 11/17/15   Calvert Cantor, MD    Past Medical History: Past Medical History  Diagnosis Date  . COPD (chronic obstructive pulmonary disease) (HCC)     PFTS 03/08/12: fev1 0.58L/1%, FVC 1.18/33%, Ratop 49 and c/w ssevere obstruction. 21% BD response on FVC, RV 219%, DLCO 11/54%  . History of alcoholism (HCC)   . Irregular menses   . DVT of upper extremity (deep vein thrombosis) [facility]) April 2013    right subclavian // Unclear precipitating cause - possibly significant right heart failure, with vascular stasis potentially predisposing to clotting.    . Pulmonary embolus [facility]) April 2013    Precipitating cause unclear. Was treated with coumadin from April-June 2013.  . Moderate to severe pulmonary hypertension [facility]) April 2013    Cardiac cath on 03/06/12 - 1. Elevated pulmonary artery pressures, right sided filling pressures.,  2. PA: 64/45 (mean 53)    . Hepatic cirrhosis (HCC)     Questionable history of - Noted on CT abdomen (03/2012) - thought to be due to vascular congestion from right heart failure +/- patient's history of alcohol abuse  . Axillary adenopathy     right axillary adenopathy noted on CT chest (03/06/2012)   . Periodontal disease   . Chronic right-sided CHF (congestive heart failure) (HCC) 03/23/2012    with cor pulmonale. Last RHC 03/2012  . Pneumonia ~ 1985; 01/2011  . History of chronic bronchitis   . Exertional shortness of breath   . On home oxygen therapy     "2-3 L 24/7" (05/03/2013)  . OSA (obstructive sleep apnea)     wears noctural BiPAP (05/03/2013)  . Migraine     "~ 1/yr" (05/03/2013)    Past Surgical History: Past Surgical History  Procedure Laterality Date  . Cardiac surgery  04/09/1977    "my heart was backwards" (05/03/2013)  . Lung surgery  1977/11/14  . Appendectomy  05/03/2013  . Cardiac catheterization  03/2012  . Laparoscopic appendectomy N/A 05/03/2013    Procedure: APPENDECTOMY LAPAROSCOPIC;  Surgeon: Cherylynn Ridges, MD;  Location: [facility]  OR;  Service: General;  Laterality: N/A;  . Right heart catheterization N/A 03/07/2012    Procedure: RIGHT HEART CATH;  Surgeon: Kathleene Hazel, MD;  Location: [facility] CATH LAB;  Service: Cardiovascular;  Laterality: N/A;    Family History: Family History  Problem Relation Age of Onset  . Hypertension Father   . Heart disease Father     CHF; died age 81   . Alcohol abuse Father   . Other Brother     died age 42 y.o overdose    Social History: Social History   Social History  . Marital Status: Married    Spouse Name: N/A  . Number of Children: 0  . Years of Education: college   Occupational History  . Unemployed     [employer]   Social History Main Topics  . Smoking status: Former Smoker -- 1.00 packs/day for 25 years    Types: Cigarettes    Quit date: 02/18/2012  . Smokeless tobacco: Never Used  . Alcohol Use: No     Comment: previously drinking 1 pint 3-4 days a week for 2002-2013, but in 2014, no longer drinking  . Drug Use: No  . Sexual Activity:    Partners: Male   Other Topics Concern  . None   Social History Narrative   Lives with husband in [place]   Has a farm with multiple animals including  chickens, [place] pigs, cockatil (bird), dogs and cats   Completed GED and 2 years of college   Smoking-quit smoking >1 year since 2014   Not working     Allergies:  No Known Allergies  Objective:    Vital Signs:   Temp:  [97.6 F (36.4 C)-98.5 F (36.9 C)] 98.5 F (36.9 C) (01/30 1155) Pulse Rate:  [76-98] 92 (01/30 1155) Resp:  [16-21] 18 (01/30 1155) BP: (100-130)/(62-94) 100/62 mmHg (01/30 1155) SpO2:  [90 %-97 %] 90 % (01/30 1528) FiO2 (%):  [40 %] 40 % (01/29 2334) Weight:  [208 lb 4.8 oz (94.484 kg)-211 lb 14.4 oz (96.117 kg)] 211 lb 14.4 oz (96.117 kg) (01/30 0325) Last BM Date: 01/05/16  Weight change: Filed Weights   01/03/16 1715 01/04/16 1629 01/05/16 0325  Weight: 208 lb 12.4 oz (94.7 kg) 208 lb 4.8 oz (94.484 kg) 211 lb 14.4 oz (96.117 kg)    Intake/Output:   Intake/Output Summary (Last 24 hours) at 01/05/16 1532 Last data filed at 01/05/16 1500  Gross per 24 hour  Intake   3210 ml  Output      0 ml  Net   3210 ml     Physical Exam: General:  Obese woman sitting up in bed. Hoarse, No resp difficulty HEENT: normal Neck: supple. JVP difficult to assess appears up to ear Carotids 2+ bilat; no bruits. No lymphadenopathy or thyromegaly appreciated. Cor: Distant PMI nonpalpable. Regular rate & rhythm. 2/6 TR Lungs: Markedly diminished throughout. No wheezing Abdomen: Obese, NT, ++ distended, no HSM. No bruits or masses. +BS  Extremities: no cyanosis or rash. Clubbing on fingers, bilaterally. edema Neuro: alert & orientedx3, cranial nerves grossly intact. moves all 4 extremities w/o difficulty. Affect pleasant  Telemetry: NSR 90s  Labs: Basic Metabolic Panel:  Recent Labs Lab 01/03/16 1024 01/04/16 0317 01/05/16 0402  NA 138 138 137  K 4.2 3.9 4.3  CL 90* 98* 98*  CO2 37* 35* 33*  GLUCOSE 156* 170* 232*  BUN 10 <5* 7  CREATININE 0.70 0.49 0.58  CALCIUM 8.9 8.3* 8.9  Liver Function Tests: No results for input(s): AST, ALT, ALKPHOS,  BILITOT, PROT, ALBUMIN in the last 168 hours. No results for input(s): LIPASE, AMYLASE in the last 168 hours. No results for input(s): AMMONIA in the last 168 hours.  CBC:  Recent Labs Lab 01/03/16 1024 01/04/16 0910  WBC 10.1 7.6  NEUTROABS 7.9* 6.9  HGB 13.8 13.4  HCT 43.4 43.1  MCV 101.4* 102.1*  PLT 129* 115*    Cardiac Enzymes: No results for input(s): CKTOTAL, CKMB, CKMBINDEX, TROPONINI in the last 168 hours.  BNP: BNP (last 3 results)  Recent Labs  01/13/15 0049 12/19/15 0440  BNP 86.5 192.9*    ProBNP (last 3 results) No results for input(s): PROBNP in the last 8760 hours.   CBG: No results for input(s): GLUCAP in the last 168 hours.  Coagulation Studies: No results for input(s): LABPROT, INR in the last 72 hours.  Other results: EKG: Sinus tach 120 RAD. Low volts  Imaging:  No results found.   Medications:     Current Medications: . arformoterol  15 mcg Nebulization BID  . budesonide (PULMICORT) nebulizer solution  0.5 mg Nebulization BID  . ceFEPime (MAXIPIME) IV  2 g Intravenous Q12H  . dextromethorphan-guaiFENesin  1 tablet Oral BID  . heparin subcutaneous  5,000 Units Subcutaneous 3 times per day  . ipratropium-albuterol  3 mL Nebulization QID  . methylPREDNISolone (SOLU-MEDROL) injection  60 mg Intravenous Q12H  . pantoprazole  40 mg Oral Daily  . polyethylene glycol  17 g Oral Daily  . torsemide  20 mg Oral Q M,W,F  . vancomycin  1,500 mg Intravenous Q12H     Infusions:      Assessment   1. Acute on chronic resp failure with hypoxia and hypercapnia 2. SIRS vs Sepsis 2/2 HCAP 3. Severe Pulmonary HTN - Possible R-sided HF 4. OSA/OHS 5. HTN 6. Obesity 7. Alcohol abuse 8. Severe COPD FEV1 0.54L (2013)  Plan   Ongoing work up for exact etiology of acute resp failure. No clear evidence of COPD exacerbation or PNA per pulmonary. ABX stopped today.   Mod/sev pulmonary HTN per RHC 2013 with PA pressure 64/45 (mean 53). PCWP  18. RA 23.  Echo 04/12/12 relatively unremarkable apart from mod/severely dilated RV.    Length of Stay: 2  Graciella Freer PA-C 01/05/2016, 3:32 PM  Advanced Heart Failure Team Pager [phone] (M-F; 7a - 4p)  Please contact [facility] for night-coverage after hours (4p -7a ) and weekends on [email or web address]  Patient seen and examined with Otilio Saber, PA-C. We discussed all aspects of the encounter. I agree with the assessment and plan as stated above.   Mary [name] has end-stage COPD with related WHO GROUP 3 PAH. She has been turned down for lung transplant in past due to noncompliance and multiple comorbidities. Now admitted with worsening respiratory failure with possible COPD flare. On exam she seems markedly volume overloaded. Will repeat echo. Start IV diuresis. Once fluid status improved agree with proceeding with RHC to further assess. Given severe COPD will most likely not be candidate for selective pulmonary vasodilators. D/w Dr. Sharon Seller.   Sabreen Kitchen,MD 6:17 PM

## 2016-01-05 NOTE — Clinical Documentation Improvement (Signed)
Internal Medicine  Can the diagnosis of chronic right sided CHF be further specified? Please document response in next progress note NOT in BPA drop down box. Thanks!    Type - Systolic, Diastolic, Systolic and Diastolic  Other  Clinically Undetermined  Document any associated diagnoses/conditions  Supporting Information:  "She appears dry so I will hold her lasix and give her some IVF overnight. Flu negative, so will d/c precautions" noted in H&P.   Please exercise your independent, professional judgment when responding. A specific answer is not anticipated or expected.  Thank You,  Shellee Milo RN, BSN, CCDS Health Information Management [place] [phone]; Cell: [phone]

## 2016-01-05 NOTE — Progress Notes (Signed)
Inpatient Diabetes Program Recommendations  AACE/ADA: New Consensus Statement on Inpatient Glycemic Control (2015)  Target Ranges:  Prepandial:   less than 140 mg/dL      Peak postprandial:   less than 180 mg/dL (1-2 hours)      Critically ill patients:  140 - 180 mg/dL   Results for Mary [name], Mary [name] (MRN [id number]) as of 01/05/2016 08:03  Ref. Range 01/03/2016 10:24 01/04/2016 03:17 01/05/2016 04:02  Glucose Latest Ref Range: 65-99 mg/dL 409 (H) 811 (H) 914 (H)    Admit with: Sepsis/ COPD/ Acute Resp Failure  History: COPD     MD- Note patient currently receiving IV Solumedrol 60 mg bid.  Lab glucose elevated this AM (232 mg/dl).  Please consider placing order for CBG checks TID AC + HS  If elevated while on IV steroids, please consider starting Novolog Sensitive SSI (0-9 units) TID AC + HS     --Will follow patient during hospitalization--  Ambrose Finland RN, MSN, CDE Diabetes Coordinator Inpatient Glycemic Control Team Team Pager: [phone] (8a-5p)

## 2016-01-05 NOTE — Progress Notes (Signed)
Mary [name] TEAM 1 - Stepdown/ICU TEAM PROGRESS NOTE  Mary [name] WUJ:[id number] DOB: 1976-04-12 DOA: 01/03/2016 PCP: Elvina Sidle, MD  Admit HPI / Brief Narrative: 40 y.o. F Hx COPD, Moderate to Severe Pulmonary Hypertension on home oxygen 2-3 L, OSA, S/P lung surgery, Chronic Rt-sided CHF with Cor Pulmonale, DVT Rt Subclavian, PE, and Alcohol Abuse, who was recently hospitalized with respiratory failure, and discharged on 12/16/15 in stable condition.  She returned to the ED with increasing shortness of breath. Symptoms began 2 days prior with light productive cough, complicated with dyspnea at rest and wheezing.   In the ED she was placed on BiPAP. She had a low-grade fever of 99.3 and was tachycardic. Chest x ray negative for acute pulmonary disease. CT angio on 12/16/15 was negative for PE ABG showed PH 7.23, PCO2 of 83, pO2 59 bicarbonate 35.1 total CO2 38 and O2 sats 93. Repeat ABGs show slight improvement.   HPI/Subjective: Patient states she is somewhat improved today but not yet at her baseline.  She denies chest pain nausea vomiting or abdominal pain.  She does feel that she is retaining fluid and gaining weight.  Assessment/Plan:  Acute on chronic respiratory failure with hypoxia and hypercapnia -Exact underlying etiology unclear - no true evidence of COPD exacerbation/emphysema - workup continues  SIRS v/s Sepsis due to HCAP -no convincing evidence of actual PNA - admit CXR w/o infiltrate - stop antibiotics after last dose today and follow clinically  Severe Pulmonary HTN - Possible R heart systolic CHF  -per R heart cath 2013 - was to be seen in CHF clinic in f/u to arrange for an outpt R heart cath on 01/07/16 - will ask CHF Team to see her while she is here - unclear if she truly has R heart failure or not    OSA / Obesity Hypoventilation Syndrome -Continue BiPAP at night   HTN -BP currently well controlled   Hx of congenital heart defect s/p surgery  -this  occurred at ~ day 3 of life - pt does not know details - denies any other heart or lung surgery since that time  Macrocytosis  -check B12 and folic acid levels   Hyperglycemia - steroid induced  -no known hx of DM - A1c 12/15/14 5.2 - wean steroid asap  Obesity - Body mass index is 38.75 kg/(m^2).  Code Status: FULL Family Communication: no family present at time of exam Disposition Plan: SDU   Consultants: none  Procedures: none  Antibiotics: Zosyn 1/28 >1/29 Cefepime 1/29 >  Vancomycin 1/29 >   DVT prophylaxis: SQ heparin   Objective: Blood pressure 100/62, pulse 92, temperature 98.5 F (36.9 C), temperature source Oral, resp. rate 18, height  (1.575 m), weight 96.117 kg (211 lb 14.4 oz), last menstrual period 11/25/2015, SpO2 90 %.  Intake/Output Summary (Last 24 hours) at 01/05/16 1457 Last data filed at 01/05/16 1300  Gross per 24 hour  Intake   3300 ml  Output      0 ml  Net   3300 ml   Exam: General: No acute respiratory distress on Oxygen at rest in bed  Lungs: Very distant breath sounds in all fields with poor air movement on exam but no appreciable wheezing or focal crackles Cardiovascular: Distant heart sounds without appreciable murmur gallop or rub - regular rhythm Abdomen: Nontender, nondistended, soft, bowel sounds positive, no rebound, no ascites, no appreciable mass Extremities: No significant cyanosis, or clubbing;  1+ edema bilateral lower extremities  Data  Reviewed:  Basic Metabolic Panel:  Recent Labs Lab 01/03/16 1024 01/04/16 0317 01/05/16 0402  NA 138 138 137  K 4.2 3.9 4.3  CL 90* 98* 98*  CO2 37* 35* 33*  GLUCOSE 156* 170* 232*  BUN 10 <5* 7  CREATININE 0.70 0.49 0.58  CALCIUM 8.9 8.3* 8.9    CBC:  Recent Labs Lab 01/03/16 1024 01/04/16 0910  WBC 10.1 7.6  NEUTROABS 7.9* 6.9  HGB 13.8 13.4  HCT 43.4 43.1  MCV 101.4* 102.1*  PLT 129* 115*    Liver Function Tests: No results for input(s): AST, ALT, ALKPHOS,  BILITOT, PROT, ALBUMIN in the last 168 hours. No results for input(s): LIPASE, AMYLASE in the last 168 hours. No results for input(s): AMMONIA in the last 168 hours.   Recent Results (from the past 240 hour(s))  Blood Culture (routine x 2)     Status: None (Preliminary result)   Collection Time: 01/03/16 10:36 AM  Result Value Ref Range Status   Specimen Description BLOOD RIGHT ANTECUBITAL  Final   Special Requests BOTTLES DRAWN AEROBIC AND ANAEROBIC 10CC  Final   Culture NO GROWTH 2 DAYS  Final   Report Status PENDING  Incomplete  Blood Culture (routine x 2)     Status: None (Preliminary result)   Collection Time: 01/03/16 10:40 AM  Result Value Ref Range Status   Specimen Description BLOOD LEFT ANTECUBITAL  Final   Special Requests BOTTLES DRAWN AEROBIC ONLY 10CC  Final   Culture NO GROWTH 2 DAYS  Final   Report Status PENDING  Incomplete  MRSA PCR Screening     Status: None   Collection Time: 01/03/16  5:40 PM  Result Value Ref Range Status   MRSA by PCR NEGATIVE NEGATIVE Final    Comment:        The GeneXpert MRSA Assay (FDA approved for NASAL specimens only), is one component of a comprehensive MRSA colonization surveillance program. It is not intended to diagnose MRSA infection nor to guide or monitor treatment for MRSA infections.   Culture, sputum-assessment     Status: None   Collection Time: 01/05/16  5:51 AM  Result Value Ref Range Status   Specimen Description SPUTUM  Final   Special Requests NONE  Final   Sputum evaluation   Final    THIS SPECIMEN IS ACCEPTABLE. RESPIRATORY CULTURE REPORT TO FOLLOW.   Report Status 01/05/2016 FINAL  Final     Studies:   Recent x-ray studies have been reviewed in detail by the Attending Physician  Scheduled Meds:  Scheduled Meds: . arformoterol  15 mcg Nebulization BID  . budesonide (PULMICORT) nebulizer solution  0.5 mg Nebulization BID  . ceFEPime (MAXIPIME) IV  2 g Intravenous Q12H  . dextromethorphan-guaiFENesin   1 tablet Oral BID  . heparin subcutaneous  5,000 Units Subcutaneous 3 times per day  . ipratropium-albuterol  3 mL Nebulization QID  . methylPREDNISolone (SOLU-MEDROL) injection  60 mg Intravenous Q12H  . pantoprazole  40 mg Oral Daily  . polyethylene glycol  17 g Oral Daily  . vancomycin  1,500 mg Intravenous Q12H    Time spent on care of this patient: 35 mins   MCCLUNG,JEFFREY T , MD   [facility] Office  [phone] Pager - Text Page per Loretha Stapler as per below:  On-Call/Text Page:      [email or web address]      password [email or web address]  If 7PM-7AM, please contact night-coverage [email or web address] Password [email or web address] 01/05/2016, 2:57 PM   LOS: 2 days

## 2016-01-06 ENCOUNTER — Inpatient Hospital Stay (HOSPITAL_COMMUNITY): Payer: 59

## 2016-01-06 DIAGNOSIS — I509 Heart failure, unspecified: Secondary | ICD-10-CM

## 2016-01-06 DIAGNOSIS — I50810 Right heart failure, unspecified: Secondary | ICD-10-CM | POA: Diagnosis present

## 2016-01-06 DIAGNOSIS — I2721 Secondary pulmonary arterial hypertension: Secondary | ICD-10-CM | POA: Diagnosis present

## 2016-01-06 LAB — COMPREHENSIVE METABOLIC PANEL
ALBUMIN: 3.1 g/dL — AB (ref 3.5–5.0)
ALT: 26 U/L (ref 14–54)
ANION GAP: 12 (ref 5–15)
AST: 22 U/L (ref 15–41)
Alkaline Phosphatase: 74 U/L (ref 38–126)
BUN: 12 mg/dL (ref 6–20)
CHLORIDE: 94 mmol/L — AB (ref 101–111)
CO2: 34 mmol/L — ABNORMAL HIGH (ref 22–32)
Calcium: 8.9 mg/dL (ref 8.9–10.3)
Creatinine, Ser: 0.64 mg/dL (ref 0.44–1.00)
GFR calc Af Amer: >60 mL/min (ref >60)
GLUCOSE: 294 mg/dL — AB (ref 65–99)
POTASSIUM: 4 mmol/L (ref 3.5–5.1)
Sodium: 140 mmol/L (ref 135–145)
TOTAL PROTEIN: 5.6 g/dL — AB (ref 6.5–8.1)
Total Bilirubin: 0.5 mg/dL (ref 0.3–1.2)

## 2016-01-06 LAB — CBC
HCT: 41.5 % (ref 36.0–46.0)
Hemoglobin: 12.5 g/dL (ref 12.0–15.0)
MCH: 31.1 pg (ref 26.0–34.0)
MCHC: 30.1 g/dL (ref 30.0–36.0)
MCV: 103.2 fL — AB (ref 78.0–100.0)
PLATELETS: 141 10*3/uL — AB (ref 150–400)
RBC: 4.02 MIL/uL (ref 3.87–5.11)
RDW: 15.4 % (ref 11.5–15.5)
WBC: 11.4 10*3/uL — AB (ref 4.0–10.5)

## 2016-01-06 LAB — VITAMIN B12: VITAMIN B 12: 526 pg/mL (ref 180–914)

## 2016-01-06 LAB — FOLATE: Folate: 6.7 ng/mL (ref >5.9)

## 2016-01-06 LAB — RESPIRATORY VIRUS PANEL
Adenovirus: NEGATIVE
Influenza A: NEGATIVE
Influenza B: NEGATIVE
METAPNEUMOVIRUS: NEGATIVE
PARAINFLUENZA 1 A: NEGATIVE
PARAINFLUENZA 2 A: NEGATIVE
PARAINFLUENZA 3 A: NEGATIVE
RHINOVIRUS: POSITIVE — AB
Respiratory Syncytial Virus A: NEGATIVE
Respiratory Syncytial Virus B: NEGATIVE

## 2016-01-06 MED ORDER — ARFORMOTEROL TARTRATE 15 MCG/2ML IN NEBU
15.0000 ug | INHALATION_SOLUTION | Freq: Two times a day (BID) | RESPIRATORY_TRACT | Status: DC
  Administered 2016-01-06 – 2016-01-09 (×5): 15 ug via RESPIRATORY_TRACT
  Filled 2016-01-06 (×5): qty 2

## 2016-01-06 MED ORDER — FUROSEMIDE 10 MG/ML IJ SOLN
40.0000 mg | Freq: Three times a day (TID) | INTRAMUSCULAR | Status: DC
  Administered 2016-01-06 (×2): 40 mg via INTRAVENOUS
  Filled 2016-01-06 (×3): qty 4

## 2016-01-06 NOTE — Progress Notes (Signed)
  Echocardiogram 2D Echocardiogram has been performed.  Arvil Chaco 01/06/2016, 12:34 PM

## 2016-01-06 NOTE — Progress Notes (Signed)
[place] TEAM 1 - Stepdown/ICU TEAM Progress Note  Marjie L Yorke MRN:[id number] DOB: 06/27/1976 DOA: 01/03/2016 PCP: LAUENSTEIN,KURT, MD  Admit HPI / Brief Narrative: 40 y.o. WF PMHx COPD, Moderate to Severe Pulmonary Hypertension on home oxygen 2-3 L, OSA, S/P lung surgery, Chronic Rt-sided CHF with Cor Pulmonale, DVT Rt Subclavian, PE, Alcohol Abuse,   Most recently rehospitalized with respiratory failure, discharged on 12/16/15 in stable condition now presented with increasing shortness of breath. She is on Bipap now, history is limited. Symptoms began about 2 days ago with light productive cough, complicated with dyspnea at rest and wheezing this morning. She attributes these symptoms to having been exposed to bronchitis from husband. Cough does not improve with change in position. She denies any fevers or chills. Denies myalgia. She does have night sweats. She denies any chest pain or palpitations. She has some nausea due to mucus production. Denies any vomiting. Reports moderate diaphragmatic pain due to forceful coughing. Denies any lower extremity pain or swelling. She does report chronic headaches without vision changes or confusion. At the ED, she was placed on BiPAP. She has low-grade fever of 99.3 and is tachycardic . Chest x ray negative for acute pulmonary disease. Recent CT angio on 12/16/15 was negative for PE CBG showed PH is 7.23, PCO2 of 83, pO 59 bicarbonate 35.1 total CO2 38 and O2 sats 93. Repeat ABGs show slight improvement.   HPI/Subjective: 1/31 A/O 4, continued acute on chronic respiratory distress. States uses 2 L O2 during the daytime. States was able to ambulate around the ward yesterday with some increased DOE, but overall felt improved    Assessment/Plan:  Sepsis/Acute  on chronic respiratory failure with hypoxia and hypercapnia/COPD exacerbation/ HCAP positive Rhinovirus  -Patient met sepsis criteria HR> 90, Temp= 38C, RR>20 -Review of CT angiogram  12/16/2015 clearly shows bulla bilaterally consistent with COPD/Emphysema. In addition [facility] note 12/23/2015 shows patient PFTs from 03/08/12 consistent with COPD  -Continue current antibiotics for 7 day course - Budesonide +Brovana BID - Duoneb QID,  - Flutter valve -Mucinex DM -Titrate O2 to maintain SPO2 89-93% -Strict in and out its admission + 3.5 L -Daily weight admission weight = 94.7 kg           1/31 standing weight= 97.5Kg  Non-anion gap respiratory acidosis compensated -Unsure what patient's baseline CO2 level is, but certain that she is trapping CO2    OSA/Obesity Hypoventilation Syndrome -Continue BiPAP at night   HTN -Hold all BP medication patient currently mildly hypotensive  Pulmonary hypertension/Right-sided Heart Failure? -See HTN -Echocardiogram pending -Per cardiology diuresis patient and then planned right heart cath -Increase Lasix to 40 mg TID       Code Status: FULL Family Communication: no family present at time of exam Disposition Plan: Home    Consultants: NA  Procedure/Significant Events: NA   Culture 1/28 strep pneumo urine antigen negative 1/28 blood pending 1/28 MRSA by PCR negative 1/28 influenza A/B/H1N1 negative 1/29 respiratory virus panel positive Rhinovirus 1/29 sputum pending   Antibiotics: Zosyn 1/28>>1/29 Cefepime 1/29>>  Vancomycin 1/29>>    DVT prophylaxis: Subcutaneous heparin   Devices NA   LINES / TUBES:  NA    Continuous Infusions:    Objective: VITAL SIGNS: Temp: 97.7 F (36.5 C) (01/31 0844) Temp Source: Oral (01/31 0844) BP: 108/79 mmHg (01/31 0844) Pulse Rate: 68 (01/31 0900) SPO2; FIO2:   Intake/Output Summary (Last 24 hours) at 01/06/16 1016 Last data filed at 01/06/16 0900  Gross per 24   hour  Intake   2000 ml  Output   3600 ml  Net  -1600 ml     Exam: General:  A/O 4, positive acute on chronic respiratory distress (improved now on 3 L O2) Eyes: Negative  headache, negative scleral hemorrhage ENT: Negative Runny nose, negative gingival bleeding, Neck:  Negative scars, masses, torticollis, lymphadenopathy, JVD Lungs:  Air movement throughout, mild diffuse expiratory wheezes (significantly improved), negative crackles Cardiovascular:  tachycardic, Regular rhythm without murmur gallop or rub normal S1 and S2 Abdomen: morbidly obese,nondistended, positive soft, bowel sounds, no rebound, no ascites, no appreciable mass Extremities: No significant cyanosis, clubbing, or edema bilateral lower extremities Psychiatric:  Negative depression, negative anxiety, negative fatigue, negative mania  Neurologic:  Cranial nerves II through XII intact, tongue/uvula midline, all extremities muscle strength 5/5, sensation intact throughout, negative dysarthria, negative expressive aphasia, negative receptive aphasia.   Data Reviewed: Basic Metabolic Panel:  Recent Labs Lab 01/03/16 1024 01/04/16 0317 01/05/16 0402 01/06/16 0333  NA 138 138 137 140  K 4.2 3.9 4.3 4.0  CL 90* 98* 98* 94*  CO2 37* 35* 33* 34*  GLUCOSE 156* 170* 232* 294*  BUN 10 <5* 7 12  CREATININE 0.70 0.49 0.58 0.64  CALCIUM 8.9 8.3* 8.9 8.9   Liver Function Tests:  Recent Labs Lab 01/06/16 0333  AST 22  ALT 26  ALKPHOS 74  BILITOT 0.5  PROT 5.6*  ALBUMIN 3.1*   No results for input(s): LIPASE, AMYLASE in the last 168 hours. No results for input(s): AMMONIA in the last 168 hours. CBC:  Recent Labs Lab 01/03/16 1024 01/04/16 0910 01/06/16 0333  WBC 10.1 7.6 11.4*  NEUTROABS 7.9* 6.9  --   HGB 13.8 13.4 12.5  HCT 43.4 43.1 41.5  MCV 101.4* 102.1* 103.2*  PLT 129* 115* 141*   Cardiac Enzymes: No results for input(s): CKTOTAL, CKMB, CKMBINDEX, TROPONINI in the last 168 hours. BNP (last 3 results)  Recent Labs  01/13/15 0049 12/19/15 0440  BNP 86.5 192.9*    ProBNP (last 3 results) No results for input(s): PROBNP in the last 8760 hours.  CBG: No results  for input(s): GLUCAP in the last 168 hours.  Recent Results (from the past 240 hour(s))  Blood Culture (routine x 2)     Status: None (Preliminary result)   Collection Time: 01/03/16 10:36 AM  Result Value Ref Range Status   Specimen Description BLOOD RIGHT ANTECUBITAL  Final   Special Requests BOTTLES DRAWN AEROBIC AND ANAEROBIC 10CC  Final   Culture NO GROWTH 2 DAYS  Final   Report Status PENDING  Incomplete  Blood Culture (routine x 2)     Status: None (Preliminary result)   Collection Time: 01/03/16 10:40 AM  Result Value Ref Range Status   Specimen Description BLOOD LEFT ANTECUBITAL  Final   Special Requests BOTTLES DRAWN AEROBIC ONLY 10CC  Final   Culture NO GROWTH 2 DAYS  Final   Report Status PENDING  Incomplete  Respiratory virus panel     Status: Abnormal   Collection Time: 01/03/16  1:03 PM  Result Value Ref Range Status   Respiratory Syncytial Virus A Negative Negative Final   Respiratory Syncytial Virus B Negative Negative Final   Influenza A Negative Negative Final   Influenza B Negative Negative Final   Parainfluenza 1 Negative Negative Final   Parainfluenza 2 Negative Negative Final   Parainfluenza 3 Negative Negative Final   Metapneumovirus Negative Negative Final   Rhinovirus Positive (A) Negative Final     Adenovirus Negative Negative Final    Comment: (NOTE) Performed At: [facility] [address] [place], [place] [id number] [place] William F MD Ph:[phone]   MRSA PCR Screening     Status: None   Collection Time: 01/03/16  5:40 PM  Result Value Ref Range Status   MRSA by PCR NEGATIVE NEGATIVE Final    Comment:        The GeneXpert MRSA Assay (FDA approved for NASAL specimens only), is one component of a comprehensive MRSA colonization surveillance program. It is not intended to diagnose MRSA infection nor to guide or monitor treatment for MRSA infections.   Culture, sputum-assessment     Status: None   Collection Time: 01/05/16  5:51 AM    Result Value Ref Range Status   Specimen Description SPUTUM  Final   Special Requests NONE  Final   Sputum evaluation   Final    THIS SPECIMEN IS ACCEPTABLE. RESPIRATORY CULTURE REPORT TO FOLLOW.   Report Status 01/05/2016 FINAL  Final  Culture, respiratory (NON-Expectorated)     Status: None (Preliminary result)   Collection Time: 01/05/16  5:51 AM  Result Value Ref Range Status   Specimen Description SPUTUM  Final   Special Requests NONE  Final   Gram Stain PENDING  Incomplete   Culture NO GROWTH Performed at [facility]   Final   Report Status PENDING  Incomplete     Studies:  Recent x-ray studies have been reviewed in detail by the Attending Physician  Scheduled Meds:  Scheduled Meds: . arformoterol  15 mcg Nebulization BID  . budesonide (PULMICORT) nebulizer solution  0.5 mg Nebulization BID  . dextromethorphan-guaiFENesin  1 tablet Oral BID  . furosemide  40 mg Intravenous TID  . heparin subcutaneous  5,000 Units Subcutaneous 3 times per day  . ipratropium-albuterol  3 mL Nebulization QID  . metolazone  2.5 mg Oral Daily  . pantoprazole  40 mg Oral Daily  . polyethylene glycol  17 g Oral Daily    Time spent on care of this patient: 40 mins   WOODS, CURTIS J , MD  [facility] Office  [phone] Pager - [phone]  On-Call/Text Page:      [email or web address]      password [email or web address]  If 7PM-7AM, please contact night-coverage [email or web address] Password [email or web address] 01/06/2016, 10:16 AM   LOS: 3 days   Care during the described time interval was provided by me .  I have reviewed this patient's available data, including medical history, events of note, physical examination, and all test results as part of my evaluation. I have personally reviewed and interpreted all radiology studies.   Curtis Woods, MD [phone] Pager    

## 2016-01-06 NOTE — Progress Notes (Signed)
Advanced Heart Failure Rounding Note  Referring Physician: Dr Sharon Seller Primary Physician: Dr Elvina Sidle.   Subjective:    Feeling better. Urine output picking up but weight actually up 3 pounds. Denies high fluid intake.     Objective:   Weight Range: 215 lb (97.523 kg) Body mass index is 39.31 kg/(m^2).   Vital Signs:   Temp:  [96.8 F (36 C)-98.5 F (36.9 C)] 97.5 F (36.4 C) (01/31 0422) Pulse Rate:  [55-92] 57 (01/31 0500) Resp:  [13-24] 17 (01/31 0500) BP: (91-123)/(58-89) 122/89 mmHg (01/31 0500) SpO2:  [90 %-100 %] 98 % (01/31 0731) FiO2 (%):  [40 %] 40 % (01/30 2319) Weight:  [215 lb (97.523 kg)] 215 lb (97.523 kg) (01/31 0454) Last BM Date: 01/05/16  Weight change: Filed Weights   01/04/16 1629 01/05/16 0325 01/06/16 0454  Weight: 208 lb 4.8 oz (94.484 kg) 211 lb 14.4 oz (96.117 kg) 215 lb (97.523 kg)    Intake/Output:   Intake/Output Summary (Last 24 hours) at 01/06/16 0750 Last data filed at 01/06/16 0653  Gross per 24 hour  Intake   1910 ml  Output   2600 ml  Net   -690 ml     Physical Exam: General: Obese woman lying in bed. Hoarse, No resp difficulty HEENT: normal Neck: supple. JVP difficult to assess appears up to ear Carotids 2+ bilat; no bruits. No thyromegaly or nodule noted. Cor: Distant PMI nonpalpable. Regular rate & rhythm. 2/6 TR Lungs: Markedly diminished throughout. No wheezing Abdomen: Obese, NT, + distended, no HSM. No bruits or masses. +BS  Extremities: no cyanosis or rash. Clubbing on fingers, bilaterally. 1+ edema Neuro: alert & orientedx3, cranial nerves grossly intact. moves all 4 extremities w/o difficulty. Affect pleasant  Telemetry: Reviewed personally, NSR  Labs: CBC  Recent Labs  01/03/16 1024 01/04/16 0910 01/06/16 0333  WBC 10.1 7.6 11.4*  NEUTROABS 7.9* 6.9  --   HGB 13.8 13.4 12.5  HCT 43.4 43.1 41.5  MCV 101.4* 102.1* 103.2*  PLT 129* 115* 141*   Basic Metabolic Panel  Recent Labs   16/10/96 0402 01/06/16 0333  NA 137 140  K 4.3 4.0  CL 98* 94*  CO2 33* 34*  GLUCOSE 232* 294*  BUN 7 12  CREATININE 0.58 0.64  CALCIUM 8.9 8.9   Liver Function Tests  Recent Labs  01/06/16 0333  AST 22  ALT 26  ALKPHOS 74  BILITOT 0.5  PROT 5.6*  ALBUMIN 3.1*   No results for input(s): LIPASE, AMYLASE in the last 72 hours. Cardiac Enzymes No results for input(s): CKTOTAL, CKMB, CKMBINDEX, TROPONINI in the last 72 hours.  BNP: BNP (last 3 results)  Recent Labs  01/13/15 0049 12/19/15 0440  BNP 86.5 192.9*    ProBNP (last 3 results) No results for input(s): PROBNP in the last 8760 hours.   D-Dimer No results for input(s): DDIMER in the last 72 hours. Hemoglobin A1C No results for input(s): HGBA1C in the last 72 hours. Fasting Lipid Panel No results for input(s): CHOL, HDL, LDLCALC, TRIG, CHOLHDL, LDLDIRECT in the last 72 hours. Thyroid Function Tests No results for input(s): TSH, T4TOTAL, T3FREE, THYROIDAB in the last 72 hours.  Invalid input(s): FREET3  Other results:     Imaging/Studies:   No results found.  Latest Echo  Latest Cath   Medications:     Scheduled Medications: . budesonide (PULMICORT) nebulizer solution  0.5 mg Nebulization BID  . dextromethorphan-guaiFENesin  1 tablet Oral BID  .  furosemide  40 mg Intravenous Q12H  . heparin subcutaneous  5,000 Units Subcutaneous 3 times per day  . ipratropium-albuterol  3 mL Nebulization QID  . metolazone  2.5 mg Oral Daily  . pantoprazole  40 mg Oral Daily  . polyethylene glycol  17 g Oral Daily     Infusions:     PRN Medications:  acetaminophen   Assessment   1. Acute on chronic resp failure with hypoxia and hypercapnia 2. SIRS vs Sepsis 2/2 HCAP 3. Severe Pulmonary HTN - Possible R-sided HF 4. OSA/OHS 5. HTN 6. Obesity 7. Alcohol abuse 8. Severe COPD FEV1 0.54L (2013)  Plan    Has severe/end-stage COPD with related WHO GROUP 3 PAH. She has been turned down for  lung tx in the past 2/2 noncompliance and comorbidities.  Continue diuresis, will increase lasix to 80 mg IV BID.  Will proceed with RHC once further diuresed. Repeat Echo pending.   Given severe COPD will most likely not be candidate for selective pulmonary vasodilators. D/w Dr. Sharon Seller.  Length of Stay: 3   Graciella Freer PA-C 01/06/2016, 7:50 AM  Advanced Heart Failure Team Pager [phone] (M-F; 7a - 4p)  Please contact [facility] for night-coverage after hours (4p -7a ) and weekends on [email or web address]   Patient seen and examined with Otilio Saber, PA-C. We discussed all aspects of the encounter. I agree with the assessment and plan as stated above.   Urine output picking up with diuresis but weight up. Will continue lasix and metolazone. Echo reviewed personally LVEF normal. RV dilated> RVSP ~7mmHG by echo. Will plan RHC at end of week after further diuresis.   Chevonne Bostrom,MD 2:38 PM

## 2016-01-07 ENCOUNTER — Encounter (HOSPITAL_COMMUNITY): Payer: 59 | Admitting: Internal Medicine

## 2016-01-07 LAB — BASIC METABOLIC PANEL
ANION GAP: 14 (ref 5–15)
BUN: 16 mg/dL (ref 6–20)
CALCIUM: 9.3 mg/dL (ref 8.9–10.3)
CHLORIDE: 78 mmol/L — AB (ref 101–111)
CO2: 48 mmol/L — AB (ref 22–32)
Creatinine, Ser: 0.69 mg/dL (ref 0.44–1.00)
GFR calc non Af Amer: >60 mL/min (ref >60)
GLUCOSE: 204 mg/dL — AB (ref 65–99)
Potassium: 2.6 mmol/L — CL (ref 3.5–5.1)
Sodium: 140 mmol/L (ref 135–145)

## 2016-01-07 LAB — CBC WITH DIFFERENTIAL/PLATELET
Basophils Absolute: 0 10*3/uL (ref 0.0–0.1)
Basophils Relative: 0 %
Eosinophils Absolute: 0 10*3/uL (ref 0.0–0.7)
Eosinophils Relative: 0 %
HEMATOCRIT: 45.4 % (ref 36.0–46.0)
HEMOGLOBIN: 14.3 g/dL (ref 12.0–15.0)
LYMPHS ABS: 2 10*3/uL (ref 0.7–4.0)
LYMPHS PCT: 22 %
MCH: 32.1 pg (ref 26.0–34.0)
MCHC: 31.5 g/dL (ref 30.0–36.0)
MCV: 102 fL — AB (ref 78.0–100.0)
MONOS PCT: 9 %
Monocytes Absolute: 0.8 10*3/uL (ref 0.1–1.0)
NEUTROS ABS: 6.4 10*3/uL (ref 1.7–7.7)
NEUTROS PCT: 69 %
Platelets: 135 10*3/uL — ABNORMAL LOW (ref 150–400)
RBC: 4.45 MIL/uL (ref 3.87–5.11)
RDW: 15.4 % (ref 11.5–15.5)
WBC: 9.4 10*3/uL (ref 4.0–10.5)

## 2016-01-07 LAB — PROTIME-INR
INR: 1.02 (ref 0.00–1.49)
PROTHROMBIN TIME: 13.6 s (ref 11.6–15.2)

## 2016-01-07 LAB — MAGNESIUM: Magnesium: 1.6 mg/dL — ABNORMAL LOW (ref 1.7–2.4)

## 2016-01-07 MED ORDER — TORSEMIDE 20 MG PO TABS: 20.0000 mg | ORAL_TABLET | Freq: Every day | ORAL | Status: DC

## 2016-01-07 MED ORDER — FUROSEMIDE 10 MG/ML IJ SOLN
40.0000 mg | Freq: Three times a day (TID) | INTRAMUSCULAR | Status: DC
  Administered 2016-01-07 (×3): 40 mg via INTRAVENOUS
  Filled 2016-01-07 (×2): qty 4

## 2016-01-07 MED ORDER — POTASSIUM CHLORIDE 20 MEQ/15ML (10%) PO SOLN
40.0000 meq | Freq: Once | ORAL | Status: AC
  Administered 2016-01-07: 40 meq via ORAL
  Filled 2016-01-07: qty 30

## 2016-01-07 MED ORDER — SODIUM CHLORIDE 0.9 % IV SOLN
INTRAVENOUS | Status: DC
  Administered 2016-01-07: via INTRAVENOUS

## 2016-01-07 MED ORDER — TRAMADOL HCL 50 MG PO TABS
50.0000 mg | ORAL_TABLET | Freq: Four times a day (QID) | ORAL | Status: DC | PRN
  Administered 2016-01-07: 50 mg via ORAL
  Filled 2016-01-07: qty 1

## 2016-01-07 MED ORDER — MAGNESIUM SULFATE 2 GM/50ML IV SOLN
2.0000 g | Freq: Once | INTRAVENOUS | Status: AC
  Administered 2016-01-07: 2 g via INTRAVENOUS
  Filled 2016-01-07: qty 50

## 2016-01-07 MED ORDER — SODIUM CHLORIDE 0.9% FLUSH
3.0000 mL | Freq: Two times a day (BID) | INTRAVENOUS | Status: DC
  Administered 2016-01-07: 3 mL via INTRAVENOUS

## 2016-01-07 MED ORDER — POTASSIUM CHLORIDE CRYS ER 20 MEQ PO TBCR
40.0000 meq | EXTENDED_RELEASE_TABLET | Freq: Once | ORAL | Status: AC
  Administered 2016-01-07: 40 meq via ORAL
  Filled 2016-01-07: qty 2

## 2016-01-07 MED ORDER — METOLAZONE 2.5 MG PO TABS
2.5000 mg | ORAL_TABLET | Freq: Every day | ORAL | Status: DC
  Administered 2016-01-07: 2.5 mg via ORAL
  Filled 2016-01-07 (×2): qty 1

## 2016-01-07 MED ORDER — SODIUM CHLORIDE 0.9 % IV SOLN: 250.0000 mL | INTRAVENOUS | Status: DC | PRN

## 2016-01-07 MED ORDER — TORSEMIDE 20 MG PO TABS: 40.0000 mg | ORAL_TABLET | Freq: Every day | ORAL | Status: DC

## 2016-01-07 MED ORDER — ALBUTEROL SULFATE (2.5 MG/3ML) 0.083% IN NEBU: 2.5000 mg | INHALATION_SOLUTION | RESPIRATORY_TRACT | Status: DC | PRN

## 2016-01-07 MED ORDER — SODIUM CHLORIDE 0.9% FLUSH: 3.0000 mL | INTRAVENOUS | Status: DC | PRN

## 2016-01-07 NOTE — Progress Notes (Signed)
Results for RONIQUA, KINTZ (MRN [id number]) as of 01/07/2016 09:13  Ref. Range 01/05/2016 04:02 01/06/2016 03:33 01/07/2016 02:34  Glucose Latest Ref Range: 65-99 mg/dL 409 (H) 811 (H) 914 (H)  Noted that blood sugars greater than 180 mg/dl. Recommend checking CBGs TID & HS and adding Novolog SENSITIVE correction scale TID & HS while in the hospital. Smith Mince RN BSN CDE

## 2016-01-07 NOTE — Progress Notes (Signed)
[place] TEAM 1 - Stepdown/ICU TEAM PROGRESS NOTE  Mary [name] WUJ:[id number] DOB: 1976/08/27 DOA: 01/03/2016 PCP: Elvina Sidle, MD  Admit HPI / Brief Narrative: 40 y.o. F Hx COPD, Moderate to Severe Pulmonary Hypertension on home oxygen 2-3 L, OSA, Chronic Rt-sided CHF with Cor Pulmonale, DVT Rt Subclavian, PE, and Alcohol Abuse, who was recently hospitalized with respiratory failure and discharged on 12/16/15 in stable condition.  She returned to the ED with increasing shortness of breath. Symptoms began 2 days prior with light productive cough, complicated with dyspnea at rest and reported wheezing.   In the ED she was placed on BiPAP. She had a low-grade fever of 99.3 and was tachycardic. Chest x ray negative for acute pulmonary disease. CT angio on 12/16/15 was negative for PE ABG showed PH 7.23, PCO2 of 83, pO2 59 bicarbonate 35.1 total CO2 38 and O2 sats 93. Repeat ABGs show slight improvement.   HPI/Subjective: The patient is resting comfortably in bed.  She reports that she feels much better overall.  She denies chest pain nausea vomiting or abdominal pain.  She is beginning to ask when she can go home.  Assessment/Plan:  Acute on chronic respiratory failure with hypoxia and hypercapnia -Exact etiology of acute exacerbation unclear - no evidence acute COPD exacerbation during my exams w/ no wheezing whatsoever despite adequate air movement - has responded well to aggressive diuresis - suspect greatest contributor to this acute decline was volume overload in setting of RV failure (due to severe chronic COPD)  Rhinovirus URI RVP + for Rhinovirus - perhaps this viral URI exacerbated her poor baseline pulm status  SIRS v/s Sepsis due to HCAP -no convincing evidence of actual PNA - admit CXR w/o infiltrate - following off abx at this time   Severe COPD -followed by Pulmonary in the outpt setting - no evidence of signif acute exacerbation at present   Severe Pulmonary HTN -  Possible R heart systolic CHF  -per R heart cath 2013 - was to be seen in CHF clinic in f/u to arrange for an outpt R heart cath on 01/07/16 - CHF Team now following and planning for R heart cath 2/2 - net negative approximately 2 L thus far  OSA / Obesity Hypoventilation Syndrome -Continue BiPAP at night   HTN -BP currently well controlled   Hypokalemia -due to diuresis - replace and follow  Hypomagnesemia -replace and follow   Hx of congenital heart defect s/p surgery  -this occurred at ~ day 3 of life - pt does not know details - denies any other heart or lung surgery since that time  Macrocytosis  -B12 and folic acid levels are both normal   Hyperglycemia - steroid induced  -no known hx of DM - A1c 12/15/14 5.2 - wean steroids asap  Obesity - Body mass index is 37.06 kg/(m^2).  Code Status: FULL Family Communication: no family present at time of exam Disposition Plan: SDU   Consultants: none  Procedures: none  Antibiotics: Zosyn 1/28 >1/29 Cefepime 1/29 > 1/30 Vancomycin 1/29 > 1/30  DVT prophylaxis: SQ heparin   Objective: Blood pressure 103/74, pulse 87, temperature 97.8 F (36.6 C), temperature source Oral, resp. rate 21, height 5\' 2"  (1.575 m), weight 91.944 kg (202 lb 11.2 oz), last menstrual period 11/25/2015, SpO2 93 %.  Intake/Output Summary (Last 24 hours) at 01/07/16 1325 Last data filed at 01/07/16 1100  Gross per 24 hour  Intake   1291 ml  Output   6950 ml  Net  -5659 ml   Exam: General: No acute respiratory distress  Lungs: Very distant breath sounds in all fields with poor air movement on exam but no appreciable wheezing  Cardiovascular: Distant heart sounds without appreciable murmur gallop or rub - regular rhythm Abdomen: Nontender, nondistended, soft, bowel sounds positive Extremities: No significant cyanosis, or clubbing;  trace edema bilateral lower extremities  Data Reviewed:  Basic Metabolic Panel:  Recent Labs Lab  01/03/16 1024 01/04/16 0317 01/05/16 0402 01/06/16 0333 01/07/16 0234  NA 138 138 137 140 140  K 4.2 3.9 4.3 4.0 2.6*  CL 90* 98* 98* 94* 78*  CO2 37* 35* 33* 34* 48*  GLUCOSE 156* 170* 232* 294* 204*  BUN 10 <5* CREATININE 0.70 0.49 0.58 0.64 0.69  CALCIUM 8.9 8.3* 8.9 8.9 9.3  MG  --   --   --   --  1.6*    CBC:  Recent Labs Lab 01/03/16 1024 01/04/16 0910 01/06/16 0333 01/07/16 0234  WBC 10.1 7.6 11.4* 9.4  NEUTROABS 7.9* 6.9  --  6.4  HGB 13.8 13.4 12.5 14.3  HCT 43.4 43.1 41.5 45.4  MCV 101.4* 102.1* 103.2* 102.0*  PLT 129* 115* 141* 135*    Liver Function Tests:  Recent Labs Lab 01/06/16 0333  AST 22  ALT 26  ALKPHOS 74  BILITOT 0.5  PROT 5.6*  ALBUMIN 3.1*    Recent Results (from the past 240 hour(s))  Blood Culture (routine x 2)     Status: None (Preliminary result)   Collection Time: 01/03/16 10:36 AM  Result Value Ref Range Status   Specimen Description BLOOD RIGHT ANTECUBITAL  Final   Special Requests BOTTLES DRAWN AEROBIC AND ANAEROBIC 10CC  Final   Culture NO GROWTH 4 DAYS  Final   Report Status PENDING  Incomplete  Blood Culture (routine x 2)     Status: None (Preliminary result)   Collection Time: 01/03/16 10:40 AM  Result Value Ref Range Status   Specimen Description BLOOD LEFT ANTECUBITAL  Final   Special Requests BOTTLES DRAWN AEROBIC ONLY 10CC  Final   Culture NO GROWTH 4 DAYS  Final   Report Status PENDING  Incomplete  Respiratory virus panel     Status: Abnormal   Collection Time: 01/03/16  1:03 PM  Result Value Ref Range Status   Respiratory Syncytial Virus A Negative Negative Final   Respiratory Syncytial Virus B Negative Negative Final   Influenza A Negative Negative Final   Influenza B Negative Negative Final   Parainfluenza 1 Negative Negative Final   Parainfluenza 2 Negative Negative Final   Parainfluenza 3 Negative Negative Final   Metapneumovirus Negative Negative Final   Rhinovirus Positive (A) Negative  Final   Adenovirus Negative Negative Final    Comment: (NOTE) Performed At: [facility] [address] [place], [place] [id number] Mila Homer MD OZ:[id number]   MRSA PCR Screening     Status: None   Collection Time: 01/03/16  5:40 PM  Result Value Ref Range Status   MRSA by PCR NEGATIVE NEGATIVE Final    Comment:        The GeneXpert MRSA Assay (FDA approved for NASAL specimens only), is one component of a comprehensive MRSA colonization surveillance program. It is not intended to diagnose MRSA infection nor to guide or monitor treatment for MRSA infections.   Culture, sputum-assessment     Status: None   Collection Time: 01/05/16  5:51 AM  Result Value Ref Range  Status   Specimen Description SPUTUM  Final   Special Requests NONE  Final   Sputum evaluation   Final    THIS SPECIMEN IS ACCEPTABLE. RESPIRATORY CULTURE REPORT TO FOLLOW.   Report Status 01/05/2016 FINAL  Final  Culture, respiratory (NON-Expectorated)     Status: None (Preliminary result)   Collection Time: 01/05/16  5:51 AM  Result Value Ref Range Status   Specimen Description SPUTUM  Final   Special Requests NONE  Final   Gram Stain   Final    ABUNDANT WBC PRESENT,BOTH PMN AND MONONUCLEAR FEW SQUAMOUS EPITHELIAL CELLS PRESENT FEW GRAM POSITIVE COCCI IN PAIRS FEW GRAM NEGATIVE RODS Performed at [facility]    Culture   Final    Culture reincubated for better growth Performed at [facility]    Report Status PENDING  Incomplete     Studies:   Recent x-ray studies have been reviewed in detail by the Attending Physician  Scheduled Meds:  Scheduled Meds: . arformoterol  15 mcg Nebulization BID  . budesonide (PULMICORT) nebulizer solution  0.5 mg Nebulization BID  . dextromethorphan-guaiFENesin  1 tablet Oral BID  . furosemide  40 mg Intravenous TID  . heparin subcutaneous  5,000 Units Subcutaneous 3 times per day  . ipratropium-albuterol  3 mL Nebulization QID  .  metolazone  2.5 mg Oral Daily  . pantoprazole  40 mg Oral Daily  . polyethylene glycol  17 g Oral Daily  . potassium chloride  40 mEq Oral Once    Time spent on care of this patient: 35 mins   Loyd Marhefka T , MD   [facility] Office  [phone] Pager - Text Page per Loretha Stapler as per below:  On-Call/Text Page:      [email or web address]      password [email or web address]  If 7PM-7AM, please contact night-coverage [email or web address] Password [email or web address] 01/07/2016, 1:25 PM   LOS: 4 days

## 2016-01-07 NOTE — Progress Notes (Signed)
Patient on beside toilet at this time, stated she will go on BIPAP shortly. RT will continue to monitor.

## 2016-01-07 NOTE — Progress Notes (Signed)
CRITICAL VALUE ALERT  Critical value received:  K 2.6  Date of notification:  01/07/2016  Time of notification:  04:53  Critical value read back:Yes.    Nurse who received alert:  Megan  MD notified (1st page):  Merdis Delay NP  Time of first page:  04:53  MD notified (2nd page):  Time of second page:  Responding MD:  Orders received and carried out  Time MD responded:

## 2016-01-07 NOTE — Progress Notes (Signed)
01/07/2016 per Sissy Hoff for the Rhinovirus no precaution indicated. [facility] RN.

## 2016-01-07 NOTE — Progress Notes (Signed)
Advanced Heart Failure Rounding Note  Referring Physician: Dr Sharon Seller Primary Physician: Dr Elvina Sidle.   Subjective:    Out 6 L and down 13 lbs.  Breathing stable to improved, still coughing. Creatinine stable. Feels much better.    Objective:   Weight Range: 202 lb 11.2 oz (91.944 kg) Body mass index is 37.06 kg/(m^2).   Vital Signs:   Temp:  [96.5 F (35.8 C)-98.8 F (37.1 C)] 97.5 F (36.4 C) (02/01 0746) Pulse Rate:  [70-99] 90 (02/01 0746) Resp:  [16-26] 26 (02/01 0746) BP: (101-123)/(72-88) 106/73 mmHg (02/01 0746) SpO2:  [90 %-98 %] 92 % (02/01 0746) FiO2 (%):  [40 %] 40 % (01/31 2343) Weight:  [202 lb 11.2 oz (91.944 kg)] 202 lb 11.2 oz (91.944 kg) (02/01 0500) Last BM Date: 01/06/16  Weight change: Filed Weights   01/05/16 0325 01/06/16 0454 01/07/16 0500  Weight: 211 lb 14.4 oz (96.117 kg) 215 lb (97.523 kg) 202 lb 11.2 oz (91.944 kg)    Intake/Output:   Intake/Output Summary (Last 24 hours) at 01/07/16 1002 Last data filed at 01/07/16 0900  Gross per 24 hour  Intake   1291 ml  Output   5950 ml  Net  -4659 ml     Physical Exam: General: Obese woman lying in bed. Hoarse,  NAD HEENT: normal Neck: supple. JVP remains elevated, but improving. Difficult to assess. Carotids 2+ bilat; no bruits. No thyromegaly or lymphadenopathy noted. Cor: Distant PMI nonpalpable. Regular rate & rhythm. 2/6 TR Lungs: Markedly diminished throughout, though improved. Normal effort Abdomen: Obese, NT, + distended, no HSM. No bruits or masses. +BS  Extremities: no cyanosis or rash. Clubbing on fingers, bilaterally. Trace ankle edema Neuro: alert & orientedx3, cranial nerves grossly intact. moves all 4 extremities w/o difficulty. Affect pleasant  Telemetry: Reviewed personally, NSR 90s  Labs: CBC  Recent Labs  01/06/16 0333 01/07/16 0234  WBC 11.4* 9.4  NEUTROABS  --  6.4  HGB 12.5 14.3  HCT 41.5 45.4  MCV 103.2* 102.0*  PLT 141* 135*   Basic  Metabolic Panel  Recent Labs  01/06/16 0333 01/07/16 0234  NA 140 140  K 4.0 2.6*  CL 94* 78*  CO2 34* 48*  GLUCOSE 294* 204*  BUN 12 16  CREATININE 0.64 0.69  CALCIUM 8.9 9.3  MG  --  1.6*   Liver Function Tests  Recent Labs  01/06/16 0333  AST 22  ALT 26  ALKPHOS 74  BILITOT 0.5  PROT 5.6*  ALBUMIN 3.1*   No results for input(s): LIPASE, AMYLASE in the last 72 hours. Cardiac Enzymes No results for input(s): CKTOTAL, CKMB, CKMBINDEX, TROPONINI in the last 72 hours.  BNP: BNP (last 3 results)  Recent Labs  01/13/15 0049 12/19/15 0440  BNP 86.5 192.9*    ProBNP (last 3 results) No results for input(s): PROBNP in the last 8760 hours.   D-Dimer No results for input(s): DDIMER in the last 72 hours. Hemoglobin A1C No results for input(s): HGBA1C in the last 72 hours. Fasting Lipid Panel No results for input(s): CHOL, HDL, LDLCALC, TRIG, CHOLHDL, LDLDIRECT in the last 72 hours. Thyroid Function Tests No results for input(s): TSH, T4TOTAL, T3FREE, THYROIDAB in the last 72 hours.  Invalid input(s): FREET3  Other results:     Imaging/Studies:  No results found.  Latest Echo  Latest Cath   Medications:     Scheduled Medications: . arformoterol  15 mcg Nebulization BID  . budesonide (PULMICORT) nebulizer solution  0.5 mg Nebulization BID  . dextromethorphan-guaiFENesin  1 tablet Oral BID  . furosemide  40 mg Intravenous TID  . heparin subcutaneous  5,000 Units Subcutaneous 3 times per day  . ipratropium-albuterol  3 mL Nebulization QID  . metolazone  2.5 mg Oral Daily  . pantoprazole  40 mg Oral Daily  . polyethylene glycol  17 g Oral Daily  . potassium chloride  40 mEq Oral Once    Infusions:    PRN Medications: acetaminophen   Assessment   1. Acute on chronic resp failure with hypoxia and hypercapnia 2. SIRS vs Sepsis 2/2 HCAP 3. Severe Pulmonary HTN - Possible R-sided HF 4. OSA/OHS 5. HTN 6. Obesity 7. Alcohol abuse 8.  Severe COPD FEV1 0.54L (2013)  Plan    Has severe/end-stage COPD with related WHO GROUP 3 PAH. She has been turned down for lung tx in the past 2/2 noncompliance and comorbidities. Given severe COPD will most likely not be candidate for selective pulmonary vasodilators. d/w Dr. Sharon Seller.  Echo reviewed personally LVEF normal. RV dilated> RVSP ~65mmHG by echo  Continue current IV lasix and metolazone for today. K supp in place  Likely switch to po tomorrow and plan RHC for thurs vs Friday.  Length of Stay: 4  Graciella Freer PA-C 01/07/2016, 10:02 AM  Advanced Heart Failure Team Pager [phone] (M-F; 7a - 4p)  Please contact [facility] for night-coverage after hours (4p -7a ) and weekends on [email or web address]  Patient seen and examined with Otilio Saber, PA-C. We discussed all aspects of the encounter. I agree with the assessment and plan as stated above.   Volume status much improved. Will continue diuresis one more day. Plan RHC tomorrow to further evaluate PAH. Supplement K aggressively.   Bensimhon, Daniel,MD 3:17 PM

## 2016-01-08 ENCOUNTER — Encounter (HOSPITAL_COMMUNITY)
Admission: EM | Disposition: A | Discharge status: Home / Self Care | Payer: Self-pay | Source: Home / Self Care | Attending: Internal Medicine

## 2016-01-08 ENCOUNTER — Encounter (HOSPITAL_COMMUNITY): Payer: Self-pay | Admitting: Internal Medicine

## 2016-01-08 HISTORY — PX: CARDIAC CATHETERIZATION: SHX172

## 2016-01-08 LAB — CULTURE, RESPIRATORY: CULTURE: NORMAL

## 2016-01-08 LAB — CULTURE, BLOOD (ROUTINE X 2)
CULTURE: NO GROWTH
Culture: NO GROWTH

## 2016-01-08 LAB — CBC
HCT: 50.6 % — ABNORMAL HIGH (ref 36.0–46.0)
HEMOGLOBIN: 16.3 g/dL — AB (ref 12.0–15.0)
MCH: 32 pg (ref 26.0–34.0)
MCHC: 32.2 g/dL (ref 30.0–36.0)
MCV: 99.4 fL (ref 78.0–100.0)
PLATELETS: 167 10*3/uL (ref 150–400)
RBC: 5.09 MIL/uL (ref 3.87–5.11)
RDW: 15 % (ref 11.5–15.5)
WBC: 11.1 10*3/uL — AB (ref 4.0–10.5)

## 2016-01-08 LAB — POCT I-STAT 3, VENOUS BLOOD GAS (G3P V)
Acid-Base Excess: 24 mmol/L — ABNORMAL HIGH (ref 0.0–2.0)
Bicarbonate: 55.3 meq/L — ABNORMAL HIGH (ref 20.0–24.0)
O2 Saturation: 51 %
TCO2: >50 mmol/L (ref 0–100)
pCO2, Ven: 77.1 mmHg (ref 45.0–50.0)
pH, Ven: 7.464 — ABNORMAL HIGH (ref 7.250–7.300)
pO2, Ven: 28 mmHg — CL (ref 30.0–45.0)

## 2016-01-08 LAB — BASIC METABOLIC PANEL
Anion gap: 15 (ref 5–15)
BUN: 18 mg/dL (ref 6–20)
CO2: 47 mmol/L — ABNORMAL HIGH (ref 22–32)
CREATININE: 0.69 mg/dL (ref 0.44–1.00)
Calcium: 10.1 mg/dL (ref 8.9–10.3)
Chloride: 76 mmol/L — ABNORMAL LOW (ref 101–111)
GFR calc Af Amer: >60 mL/min (ref >60)
GFR calc non Af Amer: >60 mL/min (ref >60)
GLUCOSE: 156 mg/dL — AB (ref 65–99)
POTASSIUM: 3.8 mmol/L (ref 3.5–5.1)
Sodium: 138 mmol/L (ref 135–145)

## 2016-01-08 LAB — CULTURE, RESPIRATORY W GRAM STAIN

## 2016-01-08 LAB — CREATININE, SERUM: CREATININE: 0.66 mg/dL (ref 0.44–1.00)

## 2016-01-08 LAB — PREGNANCY, URINE: Preg Test, Ur: NEGATIVE

## 2016-01-08 LAB — MAGNESIUM: Magnesium: 2.2 mg/dL (ref 1.7–2.4)

## 2016-01-08 SURGERY — RIGHT HEART CATH
Anesthesia: LOCAL

## 2016-01-08 MED ORDER — SODIUM CHLORIDE 0.9% FLUSH
3.0000 mL | Freq: Two times a day (BID) | INTRAVENOUS | Status: DC
  Administered 2016-01-08: 3 mL via INTRAVENOUS

## 2016-01-08 MED ORDER — SODIUM CHLORIDE 0.9% FLUSH: 3.0000 mL | INTRAVENOUS | Status: DC | PRN

## 2016-01-08 MED ORDER — MIDAZOLAM HCL 2 MG/2ML IJ SOLN
INTRAMUSCULAR | Status: DC | PRN
  Administered 2016-01-08: 0.5 mg via INTRAVENOUS

## 2016-01-08 MED ORDER — FENTANYL CITRATE (PF) 100 MCG/2ML IJ SOLN
INTRAMUSCULAR | Status: AC
  Filled 2016-01-08: qty 2

## 2016-01-08 MED ORDER — FENTANYL CITRATE (PF) 100 MCG/2ML IJ SOLN
INTRAMUSCULAR | Status: DC | PRN
  Administered 2016-01-08: 25 ug via INTRAVENOUS

## 2016-01-08 MED ORDER — SODIUM CHLORIDE 0.9 % IV SOLN: 250.0000 mL | INTRAVENOUS | Status: DC | PRN

## 2016-01-08 MED ORDER — ACETAMINOPHEN 325 MG PO TABS: 650.0000 mg | ORAL_TABLET | ORAL | Status: DC | PRN

## 2016-01-08 MED ORDER — ONDANSETRON HCL 4 MG/2ML IJ SOLN: 4.0000 mg | Freq: Four times a day (QID) | INTRAMUSCULAR | Status: DC | PRN

## 2016-01-08 MED ORDER — SODIUM CHLORIDE 0.9% FLUSH: 3.0000 mL | Freq: Two times a day (BID) | INTRAVENOUS | Status: DC

## 2016-01-08 MED ORDER — TORSEMIDE 20 MG PO TABS
20.0000 mg | ORAL_TABLET | Freq: Two times a day (BID) | ORAL | Status: DC
  Administered 2016-01-09: 20 mg via ORAL
  Filled 2016-01-08 (×4): qty 1

## 2016-01-08 MED ORDER — MIDAZOLAM HCL 2 MG/2ML IJ SOLN
INTRAMUSCULAR | Status: AC
  Filled 2016-01-08: qty 2

## 2016-01-08 MED ORDER — HEPARIN SODIUM (PORCINE) 5000 UNIT/ML IJ SOLN: 5000.0000 [IU] | Freq: Three times a day (TID) | INTRAMUSCULAR | Status: DC

## 2016-01-08 SURGICAL SUPPLY — 10 items
CATH BALLN WEDGE 5F 110CM (CATHETERS) ×2 IMPLANT
CATH SWAN GANZ 7F STRAIGHT (CATHETERS) ×2 IMPLANT
KIT HEART RIGHT NAMIC (KITS) ×2 IMPLANT
PACK CARDIAC CATHETERIZATION (CUSTOM PROCEDURE TRAY) ×2 IMPLANT
PROTECTION STATION PRESSURIZED (MISCELLANEOUS) ×2
SHEATH FAST CATH BRACH 5F 5CM (SHEATH) ×2 IMPLANT
SHEATH PINNACLE 7F 10CM (SHEATH) ×2 IMPLANT
STATION PROTECTION PRESSURIZED (MISCELLANEOUS) ×1 IMPLANT
TRANSDUCER W/STOPCOCK (MISCELLANEOUS) ×2 IMPLANT
TUBING ART PRESS 72  MALE/MALE (TUBING) ×2 IMPLANT

## 2016-01-08 NOTE — Progress Notes (Signed)
[facility] TEAM 1 - Stepdown/ICU TEAM Progress Note  Mary [name] YWV:[id number] DOB: 05-13-1976 DOA: 01/03/2016 PCP: Robyn Haber, MD  Admit HPI / Brief Narrative: 40 y.o. WF PMHx COPD, Moderate to Severe Pulmonary Hypertension on home oxygen 2-3 L, OSA, S/P lung surgery, Chronic Rt-sided CHF with Cor Pulmonale, DVT Rt Subclavian, PE, Alcohol Abuse,   Most recently rehospitalized with respiratory failure, discharged on 12/16/15 in stable condition now presented with increasing shortness of breath. She is on Bipap now, history is limited. Symptoms began about 2 days ago with light productive cough, complicated with dyspnea at rest and wheezing this morning. She attributes these symptoms to having been exposed to bronchitis from husband. Cough does not improve with change in position. She denies any fevers or chills. Denies myalgia. She does have night sweats. She denies any chest pain or palpitations. She has some nausea due to mucus production. Denies any vomiting. Reports moderate diaphragmatic pain due to forceful coughing. Denies any lower extremity pain or swelling. She does report chronic headaches without vision changes or confusion. At the ED, she was placed on BiPAP. She has low-grade fever of 99.3 and is tachycardic . Chest x ray negative for acute pulmonary disease. Recent CT angio on 12/16/15 was negative for PE CBG showed PH is 7.23, PCO2 of 83, pO 59 bicarbonate 35.1 total CO2 38 and O2 sats 93. Repeat ABGs show slight improvement.   HPI/Subjective: 2/2  A/O 4, continued acute on chronic respiratory distress. States uses 2 L O2 during the daytime.    Assessment/Plan:  Sepsis/Acute  on chronic respiratory failure with hypoxia and hypercapnia/COPD exacerbation/ HCAP positive Rhinovirus  -Patient met sepsis criteria HR> 90, Temp= 38C, RR>20 -Review of CT angiogram 12/16/2015 clearly shows bulla bilaterally consistent with COPD/Emphysema. In addition [facility] note  12/23/2015 shows patient PFTs from 03/08/12 consistent with COPD  - Budesonide +Brovana BID - Flutter valve -Mucinex DM -Titrate O2 to maintain SPO2 89-93%; still on 3 L O2 to maintain appropriate sats, may be new baseline -Ambulatory SPO2 this afternoon. -Strict in and out its admission -5.5 L -Daily weight admission weight = 94.7 kg           2/2 standing weight= 91.1 Kg -One week Follow-up Dr Robyn Haber,. for OSA/obesity hypoventilation syndrome/severe COPD/heart failure  Severe COPD -followed by Pulmonary in the outpt setting  - Schedule 2 week follow-up with Dr. Brand Males PCCM, severe COPD, medication adjustment    OSA/Obesity Hypoventilation Syndrome -Continue BiPAP at night   HTN -Hold all BP medication patient currently mildly hypotensive  Pulmonary hypertension/Right-sided Heart Failure -See HTN -Echocardiogram; see results below  -S/P Rt Heart cath; confirms mild pulmonary hypertension/right-sided heart failure see results below -Continue Torsemide 20 mg BID -Schedule for 1 month follow-up with Dr Shaune Pascal Bensimhon. Heart Failure   Hx of congenital heart defect s/p surgery  -this occurred at ~ day 3 of life - pt does not know details - denies any other heart or lung surgery since that time  Goals of care -Discharge on 2/3   Code Status: FULL Family Communication: no family present at time of exam Disposition Plan: Home    Consultants: Dr Shaune Pascal Bensimhon. Heart Failure     Procedure/Significant Events: 1/31 echocardiogram;- LVEF= 55%- 60%. -(grade 1 diastolicdysfunction). - Mitral valve: There was mild regurgitation.- Left atrium:  mildly dilated. -Right atrium: moderately dilated.- Pulmonary arteries: PA peak pressure: 44 mm Hg (S). 2/2 Right Heart Catheterization; mild pulmonary hypertension, mild PVR, normal PCWP  Culture 1/28 strep pneumo urine antigen negative 1/28 blood pending 1/28 MRSA by PCR negative 1/28 influenza A/B/H1N1  negative 1/29 respiratory virus panel positive Rhinovirus 1/29 sputum pending   Antibiotics: Zosyn 1/28>>1/29 Cefepime 1/29>> 1/30 Vancomycin 1/29>> 1/30   DVT prophylaxis: Subcutaneous heparin   Devices NA   LINES / TUBES:  NA    Continuous Infusions: . sodium chloride 10 mL/hr at 01/07/16 2359    Objective: VITAL SIGNS: Temp: 98.1 F (36.7 C) (02/02 0400) Temp Source: Oral (02/02 0400) BP: 116/74 mmHg (02/02 0855) Pulse Rate: 87 (02/02 0855) SPO2; FIO2:   Intake/Output Summary (Last 24 hours) at 01/08/16 0908 Last data filed at 01/08/16 0600  Gross per 24 hour  Intake 833.17 ml  Output   5800 ml  Net -4966.83 ml     Exam: General:  A/O 4, positive acute on chronic respiratory distress (now on 3 L O2) Eyes: Negative headache, negative scleral hemorrhage ENT: Negative Runny nose, negative gingival bleeding, Neck:  Negative scars, masses, torticollis, lymphadenopathy, JVD Lungs:  Air movement throughout, negative wheezes, , negative crackles Cardiovascular:  Tachycardic, Regular rhythm without murmur gallop or rub normal S1 and S2 Abdomen: morbidly obese,nondistended, positive soft, bowel sounds, no rebound, no ascites, no appreciable mass Extremities: No significant cyanosis, clubbing, or edema bilateral lower extremities Psychiatric:  Negative depression, negative anxiety, negative fatigue, negative mania  Neurologic:  Cranial nerves II through XII intact, tongue/uvula midline, all extremities muscle strength 5/5, sensation intact throughout, negative dysarthria, negative expressive aphasia, negative receptive aphasia.   Data Reviewed: Basic Metabolic Panel:  Recent Labs Lab 01/04/16 0317 01/05/16 0402 01/06/16 0333 01/07/16 0234 01/08/16 0255  NA 138 137 140 140 138  K 3.9 4.3 4.0 2.6* 3.8  CL 98* 98* 94* 78* 76*  CO2 35* 33* 34* 48* 47*  GLUCOSE 170* 232* 294* 204* 156*  BUN <5* 7 12 16 18   CREATININE 0.49 0.58 0.64 0.69 0.69  CALCIUM  8.3* 8.9 8.9 9.3 10.1  MG  --   --   --  1.6* 2.2   Liver Function Tests:  Recent Labs Lab 01/06/16 0333  AST 22  ALT 26  ALKPHOS 74  BILITOT 0.5  PROT 5.6*  ALBUMIN 3.1*   No results for input(s): LIPASE, AMYLASE in the last 168 hours. No results for input(s): AMMONIA in the last 168 hours. CBC:  Recent Labs Lab 01/03/16 1024 01/04/16 0910 01/06/16 0333 01/07/16 0234  WBC 10.1 7.6 11.4* 9.4  NEUTROABS 7.9* 6.9  --  6.4  HGB 13.8 13.4 12.5 14.3  HCT 43.4 43.1 41.5 45.4  MCV 101.4* 102.1* 103.2* 102.0*  PLT 129* 115* 141* 135*   Cardiac Enzymes: No results for input(s): CKTOTAL, CKMB, CKMBINDEX, TROPONINI in the last 168 hours. BNP (last 3 results)  Recent Labs  01/13/15 0049 12/19/15 0440  BNP 86.5 192.9*    ProBNP (last 3 results) No results for input(s): PROBNP in the last 8760 hours.  CBG: No results for input(s): GLUCAP in the last 168 hours.  Recent Results (from the past 240 hour(s))  Blood Culture (routine x 2)     Status: None (Preliminary result)   Collection Time: 01/03/16 10:36 AM  Result Value Ref Range Status   Specimen Description BLOOD RIGHT ANTECUBITAL  Final   Special Requests BOTTLES DRAWN AEROBIC AND ANAEROBIC 10CC  Final   Culture NO GROWTH 4 DAYS  Final   Report Status PENDING  Incomplete  Blood Culture (routine x 2)     Status:  None (Preliminary result)   Collection Time: 01/03/16 10:40 AM  Result Value Ref Range Status   Specimen Description BLOOD LEFT ANTECUBITAL  Final   Special Requests BOTTLES DRAWN AEROBIC ONLY 10CC  Final   Culture NO GROWTH 4 DAYS  Final   Report Status PENDING  Incomplete  Respiratory virus panel     Status: Abnormal   Collection Time: 01/03/16  1:03 PM  Result Value Ref Range Status   Respiratory Syncytial Virus A Negative Negative Final   Respiratory Syncytial Virus B Negative Negative Final   Influenza A Negative Negative Final   Influenza B Negative Negative Final   Parainfluenza 1 Negative  Negative Final   Parainfluenza 2 Negative Negative Final   Parainfluenza 3 Negative Negative Final   Metapneumovirus Negative Negative Final   Rhinovirus Positive (A) Negative Final   Adenovirus Negative Negative Final    Comment: (NOTE) Performed At: [facility] [address] [place], [place] [id number] Lindon Romp MD RS:[id number]   MRSA PCR Screening     Status: None   Collection Time: 01/03/16  5:40 PM  Result Value Ref Range Status   MRSA by PCR NEGATIVE NEGATIVE Final    Comment:        The GeneXpert MRSA Assay (FDA approved for NASAL specimens only), is one component of a comprehensive MRSA colonization surveillance program. It is not intended to diagnose MRSA infection nor to guide or monitor treatment for MRSA infections.   Culture, sputum-assessment     Status: None   Collection Time: 01/05/16  5:51 AM  Result Value Ref Range Status   Specimen Description SPUTUM  Final   Special Requests NONE  Final   Sputum evaluation   Final    THIS SPECIMEN IS ACCEPTABLE. RESPIRATORY CULTURE REPORT TO FOLLOW.   Report Status 01/05/2016 FINAL  Final  Culture, respiratory (NON-Expectorated)     Status: None   Collection Time: 01/05/16  5:51 AM  Result Value Ref Range Status   Specimen Description SPUTUM  Final   Special Requests NONE  Final   Gram Stain   Final    ABUNDANT WBC PRESENT,BOTH PMN AND MONONUCLEAR FEW SQUAMOUS EPITHELIAL CELLS PRESENT FEW GRAM POSITIVE COCCI IN PAIRS FEW GRAM NEGATIVE RODS Performed at [facility]    Culture   Final    NORMAL OROPHARYNGEAL FLORA Performed at [facility]    Report Status 01/08/2016 FINAL  Final     Studies:  Recent x-ray studies have been reviewed in detail by the Attending Physician  Scheduled Meds:  Scheduled Meds: . [MAR Hold] arformoterol  15 mcg Nebulization BID  . [MAR Hold] budesonide (PULMICORT) nebulizer solution  0.5 mg Nebulization BID  . [MAR Hold]  dextromethorphan-guaiFENesin  1 tablet Oral BID  . [MAR Hold] furosemide  40 mg Intravenous TID  . [MAR Hold] heparin subcutaneous  5,000 Units Subcutaneous 3 times per day  . [MAR Hold] metolazone  2.5 mg Oral Daily  . [MAR Hold] pantoprazole  40 mg Oral Daily  . [MAR Hold] polyethylene glycol  17 g Oral Daily  . sodium chloride flush  3 mL Intravenous Q12H  . torsemide  20 mg Oral BID    Time spent on care of this patient: 40 mins   Volanda Mangine, Geraldo Docker , MD  [facility] Office  [phone] Pager - [phone]  On-Call/Text Page:      [email or web address]      password [email or web address]  If 7PM-7AM, please contact night-coverage [email or web address] Password [email or web address]  01/08/2016, 9:08 AM   LOS: 5 days   Care during the described time interval was provided by me .  I have reviewed this patient's available data, including medical history, events of note, physical examination, and all test results as part of my evaluation. I have personally reviewed and interpreted all radiology studies.   Dia Crawford, MD [phone] Pager

## 2016-01-08 NOTE — Progress Notes (Signed)
SATURATION QUALIFICATIONS: (This note is used to comply with regulatory documentation for home oxygen  Patient Saturations on 3 Liters of oxygen while Ambulating = 86%  Please briefly explain why patient needs home oxygen: Hypoxia

## 2016-01-08 NOTE — Interval H&P Note (Signed)
History and Physical Interval Note:  01/08/2016 8:01 AM  Mary [name]  has presented today for surgery, with the diagnosis of pulmonary hypertension  The various methods of treatment have been discussed with the patient and family. After consideration of risks, benefits and other options for treatment, the patient has consented to  Procedure(s): Right Heart Cath (N/A) as a surgical intervention .  The patient's history has been reviewed, patient examined, no change in status, stable for surgery.  I have reviewed the patient's chart and labs.  Questions were answered to the patient's satisfaction.     Bensimhon, Reuel Boom

## 2016-01-08 NOTE — Progress Notes (Signed)
Site area: rt groin fv sheath  Site Prior to Removal:  Level   0 Pressure Applied For:  15 minutes Manual:   yes Patient Status During Pull:  stable Post Pull Site:  Level  0 Post Pull Instructions Given:  yes Post Pull Pulses Present: yes Dressing Applied:  Small tegaderm Bedrest begins @  0850 Comments:

## 2016-01-08 NOTE — H&P (View-Only) (Signed)
Advanced Heart Failure Rounding Note  Referring Physician: Dr Sharon Seller Primary Physician: Dr Elvina Sidle.   Subjective:    Out 6 L and down 13 lbs.  Breathing stable to improved, still coughing. Creatinine stable. Feels much better.    Objective:   Weight Range: 202 lb 11.2 oz (91.944 kg) Body mass index is 37.06 kg/(m^2).   Vital Signs:   Temp:  [96.5 F (35.8 C)-98.8 F (37.1 C)] 97.5 F (36.4 C) (02/01 0746) Pulse Rate:  [70-99] 90 (02/01 0746) Resp:  [16-26] 26 (02/01 0746) BP: (101-123)/(72-88) 106/73 mmHg (02/01 0746) SpO2:  [90 %-98 %] 92 % (02/01 0746) FiO2 (%):  [40 %] 40 % (01/31 2343) Weight:  [202 lb 11.2 oz (91.944 kg)] 202 lb 11.2 oz (91.944 kg) (02/01 0500) Last BM Date: 01/06/16  Weight change: Filed Weights   01/05/16 0325 01/06/16 0454 01/07/16 0500  Weight: 211 lb 14.4 oz (96.117 kg) 215 lb (97.523 kg) 202 lb 11.2 oz (91.944 kg)    Intake/Output:   Intake/Output Summary (Last 24 hours) at 01/07/16 1002 Last data filed at 01/07/16 0900  Gross per 24 hour  Intake   1291 ml  Output   5950 ml  Net  -4659 ml     Physical Exam: General: Obese woman lying in bed. Hoarse,  NAD HEENT: normal Neck: supple. JVP remains elevated, but improving. Difficult to assess. Carotids 2+ bilat; no bruits. No thyromegaly or lymphadenopathy noted. Cor: Distant PMI nonpalpable. Regular rate & rhythm. 2/6 TR Lungs: Markedly diminished throughout, though improved. Normal effort Abdomen: Obese, NT, + distended, no HSM. No bruits or masses. +BS  Extremities: no cyanosis or rash. Clubbing on fingers, bilaterally. Trace ankle edema Neuro: alert & orientedx3, cranial nerves grossly intact. moves all 4 extremities w/o difficulty. Affect pleasant  Telemetry: Reviewed personally, NSR 90s  Labs: CBC  Recent Labs  01/06/16 0333 01/07/16 0234  WBC 11.4* 9.4  NEUTROABS  --  6.4  HGB 12.5 14.3  HCT 41.5 45.4  MCV 103.2* 102.0*  PLT 141* 135*   Basic  Metabolic Panel  Recent Labs  01/06/16 0333 01/07/16 0234  NA 140 140  K 4.0 2.6*  CL 94* 78*  CO2 34* 48*  GLUCOSE 294* 204*  BUN 12 16  CREATININE 0.64 0.69  CALCIUM 8.9 9.3  MG  --  1.6*   Liver Function Tests  Recent Labs  01/06/16 0333  AST 22  ALT 26  ALKPHOS 74  BILITOT 0.5  PROT 5.6*  ALBUMIN 3.1*   No results for input(s): LIPASE, AMYLASE in the last 72 hours. Cardiac Enzymes No results for input(s): CKTOTAL, CKMB, CKMBINDEX, TROPONINI in the last 72 hours.  BNP: BNP (last 3 results)  Recent Labs  01/13/15 0049 12/19/15 0440  BNP 86.5 192.9*    ProBNP (last 3 results) No results for input(s): PROBNP in the last 8760 hours.   D-Dimer No results for input(s): DDIMER in the last 72 hours. Hemoglobin A1C No results for input(s): HGBA1C in the last 72 hours. Fasting Lipid Panel No results for input(s): CHOL, HDL, LDLCALC, TRIG, CHOLHDL, LDLDIRECT in the last 72 hours. Thyroid Function Tests No results for input(s): TSH, T4TOTAL, T3FREE, THYROIDAB in the last 72 hours.  Invalid input(s): FREET3  Other results:     Imaging/Studies:  No results found.  Latest Echo  Latest Cath   Medications:     Scheduled Medications: . arformoterol  15 mcg Nebulization BID  . budesonide (PULMICORT) nebulizer solution  0.5 mg Nebulization BID  . dextromethorphan-guaiFENesin  1 tablet Oral BID  . furosemide  40 mg Intravenous TID  . heparin subcutaneous  5,000 Units Subcutaneous 3 times per day  . ipratropium-albuterol  3 mL Nebulization QID  . metolazone  2.5 mg Oral Daily  . pantoprazole  40 mg Oral Daily  . polyethylene glycol  17 g Oral Daily  . potassium chloride  40 mEq Oral Once    Infusions:    PRN Medications: acetaminophen   Assessment   1. Acute on chronic resp failure with hypoxia and hypercapnia 2. SIRS vs Sepsis 2/2 HCAP 3. Severe Pulmonary HTN - Possible R-sided HF 4. OSA/OHS 5. HTN 6. Obesity 7. Alcohol abuse 8.  Severe COPD FEV1 0.54L (2013)  Plan    Has severe/end-stage COPD with related WHO GROUP 3 PAH. She has been turned down for lung tx in the past 2/2 noncompliance and comorbidities. Given severe COPD will most likely not be candidate for selective pulmonary vasodilators. d/w Dr. McClung.  Echo reviewed personally LVEF normal. RV dilated> RVSP ~45mmHG by echo  Continue current IV lasix and metolazone for today. K supp in place  Likely switch to po tomorrow and plan RHC for thurs vs Friday.  Length of Stay: 4  Michael Andrew Tillery PA-C 01/07/2016, 10:02 AM  Advanced Heart Failure Team Pager [phone] (M-F; 7a - 4p)  Please contact [facility] for night-coverage after hours (4p -7a ) and weekends on [email or web address]  Patient seen and examined with Andy Tillery, PA-C. We discussed all aspects of the encounter. I agree with the assessment and plan as stated above.   Volume status much improved. Will continue diuresis one more day. Plan RHC tomorrow to further evaluate PAH. Supplement K aggressively.   Bensimhon, Daniel,MD 3:17 PM   

## 2016-01-08 NOTE — CV Procedure (Signed)
RHC cath  Findings:  RA = 11 RV = 41/4/8 PA = 45/16 (35) PCW = 11 Fick cardiac output/index = 4.5/2.4 PVR = 5.3 WU FA sat = 80% PA sat = 51%, 50%  Assessment: 1) Mild PAH with mildly elevated PVR 2) Normal PCWP  Plan/Discussion:  She has been diuresed well. Just mild PAH. Ongoing hypoxemia. Will switch back to oral diuretics. Check PFTs in am. D/c home tomorrow. Will need ongoing attempts to make sure oxygenation is adequate.   Mary Vogl,MD 8:22 AM

## 2016-01-09 LAB — POCT I-STAT 3, ART BLOOD GAS (G3+)
ACID-BASE EXCESS: 30 mmol/L — AB (ref 0.0–2.0)
BICARBONATE: 60.3 meq/L — AB (ref 20.0–24.0)
O2 Saturation: 80 %
PH ART: 7.538 — AB (ref 7.350–7.450)
pCO2 arterial: 70.8 mmHg (ref 35.0–45.0)
pO2, Arterial: 43 mmHg — ABNORMAL LOW (ref 80.0–100.0)

## 2016-01-09 LAB — BASIC METABOLIC PANEL
Anion gap: 14 (ref 5–15)
BUN: 19 mg/dL (ref 6–20)
CO2: 41 mmol/L — ABNORMAL HIGH (ref 22–32)
CREATININE: 0.67 mg/dL (ref 0.44–1.00)
Calcium: 10 mg/dL (ref 8.9–10.3)
Chloride: 77 mmol/L — ABNORMAL LOW (ref 101–111)
Glucose, Bld: 178 mg/dL — ABNORMAL HIGH (ref 65–99)
POTASSIUM: 3.5 mmol/L (ref 3.5–5.1)
SODIUM: 132 mmol/L — AB (ref 135–145)

## 2016-01-09 LAB — POCT I-STAT 3, VENOUS BLOOD GAS (G3P V)
ACID-BASE EXCESS: 28 mmol/L — AB (ref 0.0–2.0)
Bicarbonate: 60.2 mEq/L — ABNORMAL HIGH (ref 20.0–24.0)
O2 SAT: 50 %
PCO2 VEN: 79.1 mmHg — AB (ref 45.0–50.0)
PO2 VEN: 27 mmHg — AB (ref 30.0–45.0)
TCO2: >50 mmol/L (ref 0–100)
pH, Ven: 7.489 — ABNORMAL HIGH (ref 7.250–7.300)

## 2016-01-09 LAB — MAGNESIUM: MAGNESIUM: 1.8 mg/dL (ref 1.7–2.4)

## 2016-01-09 MED ORDER — TORSEMIDE 20 MG PO TABS: 20.0000 mg | ORAL_TABLET | Freq: Two times a day (BID) | ORAL | Status: DC

## 2016-01-09 MED ORDER — POLYETHYLENE GLYCOL 3350 17 G PO PACK: 17.0000 g | PACK | Freq: Every day | ORAL | Status: DC | PRN

## 2016-01-09 NOTE — Discharge Summary (Signed)
DISCHARGE SUMMARY  MORNING HALBERG  MR#: [id number]  DOB:06-12-1976  Date of Admission: 01/03/2016 Date of Discharge: 01/09/2016  Attending Physician:Aeron Donaghey T  Patient's WNU:[id number],HKVQ, MD  Consults: CHF Team   Disposition: D/C home   Follow-up Appts:     Follow-up Information    Follow up with [facility], MD. Schedule an appointment as soon as possible for a visit in 2 weeks.   Specialty:  Pulmonary Disease   Why:   Schedule 2 week follow-up with Dr. Kalman Shan PCCM, severe COPD, medication adjustment.Appointment: January 21, 2016 @ 4:30pm   Contact information:   [address] [place] [address] [phone]       Follow up with Arvilla Meres, MD. Schedule an appointment as soon as possible for a visit in 1 month.   Specialty:  Cardiology   Why:  Schedule for 1 month follow-up with Dr Bevelyn Buckles Bensimhon. Heart Failure. Appointment: January 22, 2016 @ 10:20am   Contact information:   [address] [place] [address] [phone]       Follow up with Elvina Sidle, MD.   Specialty:  Family Medicine   Why:  One week Follow-up for OSA/obesity hypoventilation syndrome/severe COPD/heart failure. Appointment: January 16, 2016. Patient can come between 8am-2pm   Contact information:   [address] [place] [address] [phone]      Tests Needing Follow-up: - need to follow oxygenation to assure it is consistently 88% or > - follow volume status w/ ongoing diuretic use   Discharge Diagnoses: Acute on chronic respiratory failure with hypoxia and hypercapnia Rhinovirus URI SIRS v/s Sepsis due to HCAP Severe COPD Severe Pulmonary HTN - Possible R heart systolic CHF  OSA / Obesity Hypoventilation Syndrome HTN Hypokalemia Hypomagnesemia Hx of congenital heart defect s/p surgery  Macrocytosis  Hyperglycemia - steroid induced  Obesity - Body mass index is 37.06 kg/(m^2).  Initial  presentation: 40 y.o. F Hx COPD, Moderate to Severe Pulmonary Hypertension on home oxygen 2-3 L, OSA, Chronic Rt-sided CHF with Cor Pulmonale, DVT Rt Subclavian, PE, and Alcohol Abuse, who was recently hospitalized with respiratory failure and discharged on 12/16/15 in stable condition. She returned to the ED with increasing shortness of breath. Symptoms began 2 days prior with light productive cough, complicated with dyspnea at rest and reported wheezing.   In the ED she was placed on BiPAP. She had a low-grade fever of 99.3 and was tachycardic. Chest x ray negative for acute pulmonary disease. CT angio on 12/16/15 was negative for PE ABG showed PH 7.23, PCO2 of 83, pO2 59 bicarbonate 35.1 total CO2 38 and O2 sats 93. Repeat ABGs show slight improvement.   Hospital Course:  Acute on chronic respiratory failure with hypoxia and hypercapnia -responded well to aggressive diuresis - suspect greatest contributor to this acute decline was volume overload - net negative ~6.5L this hospital stay - previously required 2L [place] home O2 - increased to 3L [place] at all times during this hospital stay  Severe COPD -followed by Pulmonary in the outpt setting - no evidence of signif acute exacerbation during this hospital stay    Reported Hx of Severe Pulmonary HTN - Possible R heart systolic CHF  -Hx noted per R heart cath 2013 - was to be seen in CHF clinic in f/u to arrange for an outpt R heart cath on 01/07/16 - CHF Team consulted and performed R heart cath 2/2 noting only mild PAH with mildly elevated PVR - to cont oral diuretic  and f/u in CHF Clinic - need to follow oxygenation to assure it is consistently 88% or >  Rhinovirus URI RVP + for Rhinovirus - perhaps this viral URI exacerbated her poor baseline pulm status - no sx of persistent infxn at time of d/c   SIRS v/s Sepsis due to HCAP -no convincing evidence of actual PNA - no convincing infiltrate noted on CXR - completed 3 days of abx tx - no evidence of  ongoing infection at time of d/c   OSA / Obesity Hypoventilation Syndrome -Continue CPAP/BiPAP at night   HTN -BP well controlled during this hospital stay  Hypokalemia -due to diuresis - replaced  Hypomagnesemia -replaced  Hx of congenital heart defect s/p surgery  -this occurred at ~ day 3 of life - pt does not know details - denies any other heart or lung surgery since that time  Macrocytosis  -B12 and folic acid levels both normal   Hyperglycemia - steroid induced  -no known hx of DM - A1c 12/15/14 5.2   Obesity - Body mass index is 37.06 kg/(m^2)    Medication List    STOP taking these medications        OXYGEN     predniSONE 10 MG tablet  Commonly known as:  DELTASONE     simethicone 80 MG chewable tablet  Commonly known as:  GAS-X      TAKE these medications        albuterol 108 (90 Base) MCG/ACT inhaler  Commonly known as:  PROVENTIL HFA;VENTOLIN HFA  Inhale 2 puffs into the lungs every 6 (six) hours as needed for wheezing or shortness of breath.     docusate sodium 100 MG capsule  Commonly known as:  COLACE  Take 2 capsules (200 mg total) by mouth at bedtime.     Fluticasone-Salmeterol 250-50 MCG/DOSE Aepb  Commonly known as:  ADVAIR  Inhale 1 puff into the lungs every 12 (twelve) hours.     polyethylene glycol packet  Commonly known as:  MIRALAX / GLYCOLAX  Take 17 g by mouth daily as needed for mild constipation.     SPIRIVA HANDIHALER 18 MCG inhalation capsule  Generic drug:  tiotropium  PLACE 1 CAPSULE (18 MCG TOTAL) INTO INHALER AND INHALE DAILY.     torsemide 20 MG tablet  Commonly known as:  DEMADEX  Take 1 tablet (20 mg total) by mouth 2 (two) times daily.       Day of Discharge BP 110/77 mmHg  Pulse 80  Temp(Src) 97.9 F (36.6 C) (Oral)  Resp 18  Ht 5\' 2"  (1.575 m)  Wt 91.672 kg (202 lb 1.6 oz)  BMI 36.96 kg/m2  SpO2 92%  LMP 11/25/2015  Physical Exam: General: No acute respiratory distress - sitting up in bed talking   Lungs: Very poor air movement throughout all fields but with no focal crackles or wheezing Cardiovascular: Regular rate and rhythm without murmur - distant HS  Abdomen: Nontender, nondistended, soft, bowel sounds positive, no rebound, no ascites, no appreciable mass Extremities: No significant cyanosis, clubbing, or edema bilateral lower extremities  Basic Metabolic Panel:  Recent Labs Lab 01/05/16 0402 01/06/16 0333 01/07/16 0234 01/08/16 0255 01/08/16 0950 01/09/16 0340  NA 137 140 140 138  --  132*  K 4.3 4.0 2.6* 3.8  --  3.5  CL 98* 94* 78* 76*  --  77*  CO2 33* 34* 48* 47*  --  41*  GLUCOSE 232* 294* 204* 156*  --  178*  BUN --  19  CREATININE 0.58 0.64 0.69 0.69 0.66 0.67  CALCIUM 8.9 8.9 9.3 10.1  --  10.0  MG  --   --  1.6* 2.2  --  1.8    Liver Function Tests:  Recent Labs Lab 01/06/16 0333  AST 22  ALT 26  ALKPHOS 74  BILITOT 0.5  PROT 5.6*  ALBUMIN 3.1*    Coags:  Recent Labs Lab 01/07/16 2119  INR 1.02    CBC:  Recent Labs Lab 01/03/16 1024 01/04/16 0910 01/06/16 0333 01/07/16 0234 01/08/16 0950  WBC 10.1 7.6 11.4* 9.4 11.1*  NEUTROABS 7.9* 6.9  --  6.4  --   HGB 13.8 13.4 12.5 14.3 16.3*  HCT 43.4 43.1 41.5 45.4 50.6*  MCV 101.4* 102.1* 103.2* 102.0* 99.4  PLT 129* 115* 141* 135* 167    Recent Results (from the past 240 hour(s))  Blood Culture (routine x 2)     Status: None   Collection Time: 01/03/16 10:36 AM  Result Value Ref Range Status   Specimen Description BLOOD RIGHT ANTECUBITAL  Final   Special Requests BOTTLES DRAWN AEROBIC AND ANAEROBIC 10CC  Final   Culture NO GROWTH 5 DAYS  Final   Report Status 01/08/2016 FINAL  Final  Blood Culture (routine x 2)     Status: None   Collection Time: 01/03/16 10:40 AM  Result Value Ref Range Status   Specimen Description BLOOD LEFT ANTECUBITAL  Final   Special Requests BOTTLES DRAWN AEROBIC ONLY 10CC  Final   Culture NO GROWTH 5 DAYS  Final   Report Status 01/08/2016  FINAL  Final  Respiratory virus panel     Status: Abnormal   Collection Time: 01/03/16  1:03 PM  Result Value Ref Range Status   Respiratory Syncytial Virus A Negative Negative Final   Respiratory Syncytial Virus B Negative Negative Final   Influenza A Negative Negative Final   Influenza B Negative Negative Final   Parainfluenza 1 Negative Negative Final   Parainfluenza 2 Negative Negative Final   Parainfluenza 3 Negative Negative Final   Metapneumovirus Negative Negative Final   Rhinovirus Positive (A) Negative Final   Adenovirus Negative Negative Final    Comment: (NOTE) Performed At: [facility] [address] [place], [place] [id number] Mila Homer MD WU:[id number]   MRSA PCR Screening     Status: None   Collection Time: 01/03/16  5:40 PM  Result Value Ref Range Status   MRSA by PCR NEGATIVE NEGATIVE Final    Comment:        The GeneXpert MRSA Assay (FDA approved for NASAL specimens only), is one component of a comprehensive MRSA colonization surveillance program. It is not intended to diagnose MRSA infection nor to guide or monitor treatment for MRSA infections.   Culture, sputum-assessment     Status: None   Collection Time: 01/05/16  5:51 AM  Result Value Ref Range Status   Specimen Description SPUTUM  Final   Special Requests NONE  Final   Sputum evaluation   Final    THIS SPECIMEN IS ACCEPTABLE. RESPIRATORY CULTURE REPORT TO FOLLOW.   Report Status 01/05/2016 FINAL  Final  Culture, respiratory (NON-Expectorated)     Status: None   Collection Time: 01/05/16  5:51 AM  Result Value Ref Range Status   Specimen Description SPUTUM  Final   Special Requests NONE  Final   Gram Stain   Final    ABUNDANT WBC PRESENT,BOTH PMN AND  MONONUCLEAR FEW SQUAMOUS EPITHELIAL CELLS PRESENT FEW GRAM POSITIVE COCCI IN PAIRS FEW GRAM NEGATIVE RODS Performed at [facility]    Culture   Final    NORMAL OROPHARYNGEAL FLORA Performed at [facility]    Report Status 01/08/2016 FINAL  Final     Time spent in discharge (includes decision making & examination of pt): >35 minutes  01/09/2016, 10:57 AM   Lonia Blood, MD [facility] Office  [phone] Pager [phone]  On-Call/Text Page:      [email or web address]      password [facility]

## 2016-01-09 NOTE — Progress Notes (Signed)
Advanced Heart Failure Rounding Note  Referring Physician: Dr Sharon Seller Primary Physician: Dr Elvina Sidle.   Subjective:    Cath results reviewed. Feels better. Breathing ok.  Going home today.    Objective:   Weight Range: 91.672 kg (202 lb 1.6 oz) Body mass index is 36.96 kg/(m^2).   Vital Signs:   Temp:  [97.3 F (36.3 C)-98.2 F (36.8 C)] 97.9 F (36.6 C) (02/03 0833) Pulse Rate:  [75-92] 80 (02/03 0833) Resp:  [16-19] 18 (02/03 0833) BP: (94-111)/(67-83) 110/77 mmHg (02/03 0833) SpO2:  [91 %-99 %] 92 % (02/03 0833) FiO2 (%):  [40 %] 40 % (02/02 2339) Weight:  [91.672 kg (202 lb 1.6 oz)] 91.672 kg (202 lb 1.6 oz) (02/03 0446) Last BM Date: 01/08/16  Weight change: Filed Weights   01/07/16 2048 01/08/16 0500 01/09/16 0446  Weight: 92 kg (202 lb 13.2 oz) 91.173 kg (201 lb) 91.672 kg (202 lb 1.6 oz)    Intake/Output:   Intake/Output Summary (Last 24 hours) at 01/09/16 1327 Last data filed at 01/09/16 0844  Gross per 24 hour  Intake    360 ml  Output   1100 ml  Net   -740 ml     Physical Exam: General: Obese woman lying in bed. Hoarse,  NAD HEENT: normal Neck: supple. JVP 9. Difficult to assess. Carotids 2+ bilat; no bruits. No thyromegaly or lymphadenopathy noted. Cor: Distant PMI nonpalpable. Regular rate & rhythm. 2/6 TR Lungs: Markedly diminished throughout, though improved. Normal effort Abdomen: Obese, NT, +, no HSM. No bruits or masses. +BS  Extremities: no cyanosis or rash. Clubbing on fingers, bilaterally. Trace ankle edema Neuro: alert & orientedx3, cranial nerves grossly intact. moves all 4 extremities w/o difficulty. Affect pleasant  Telemetry: Reviewed personally, NSR 90s  Labs: CBC  Recent Labs  01/07/16 0234 01/08/16 0950  WBC 9.4 11.1*  NEUTROABS 6.4  --   HGB 14.3 16.3*  HCT 45.4 50.6*  MCV 102.0* 99.4  PLT 135* 167   Basic Metabolic Panel  Recent Labs  01/08/16 0255 01/08/16 0950 01/09/16 0340  NA 138  --  132*    K 3.8  --  3.5  CL 76*  --  77*  CO2 47*  --  41*  GLUCOSE 156*  --  178*  BUN 18  --  19  CREATININE 0.69 0.66 0.67  CALCIUM 10.1  --  10.0  MG 2.2  --  1.8   Liver Function Tests No results for input(s): AST, ALT, ALKPHOS, BILITOT, PROT, ALBUMIN in the last 72 hours. No results for input(s): LIPASE, AMYLASE in the last 72 hours. Cardiac Enzymes No results for input(s): CKTOTAL, CKMB, CKMBINDEX, TROPONINI in the last 72 hours.  BNP: BNP (last 3 results)  Recent Labs  01/13/15 0049 12/19/15 0440  BNP 86.5 192.9*    ProBNP (last 3 results) No results for input(s): PROBNP in the last 8760 hours.   D-Dimer No results for input(s): DDIMER in the last 72 hours. Hemoglobin A1C No results for input(s): HGBA1C in the last 72 hours. Fasting Lipid Panel No results for input(s): CHOL, HDL, LDLCALC, TRIG, CHOLHDL, LDLDIRECT in the last 72 hours. Thyroid Function Tests No results for input(s): TSH, T4TOTAL, T3FREE, THYROIDAB in the last 72 hours.  Invalid input(s): FREET3  Other results:     Imaging/Studies:  No results found.  Latest Echo  Latest Cath   Medications:     Scheduled Medications: . arformoterol  15 mcg Nebulization BID  .  budesonide (PULMICORT) nebulizer solution  0.5 mg Nebulization BID  . dextromethorphan-guaiFENesin  1 tablet Oral BID  . heparin subcutaneous  5,000 Units Subcutaneous 3 times per day  . pantoprazole  40 mg Oral Daily  . polyethylene glycol  17 g Oral Daily  . torsemide  20 mg Oral BID    Infusions:    PRN Medications: acetaminophen, ondansetron (ZOFRAN) IV, traMADol   Assessment   1. Acute on chronic resp failure with hypoxia and hypercapnia 2. SIRS vs Sepsis 2/2 HCAP 3. Severe Pulmonary HTN - Possible R-sided HF 4. OSA/OHS 5. HTN 6. Obesity 7. Alcohol abuse 8. Severe COPD FEV1 0.54L (2013)  Plan    Volume status much improved. RHC with very mild PAH. No role for selective pulmonary vasodilators. OK for  discharge. Agree with torsemide 20 bid. Will need BMET next week to ensure we don't overdiurese. Can f/u with me in HF Clinic.   Bensimhon, Daniel,MD 1:27 PM

## 2016-01-09 NOTE — Discharge Instructions (Signed)
Chronic Obstructive Pulmonary Disease Chronic obstructive pulmonary disease (COPD) is a common lung condition in which airflow from the lungs is limited. COPD is a general term that can be used to describe many different lung problems that limit airflow, including both chronic bronchitis and emphysema. If you have COPD, your lung function will probably never return to normal, but there are measures you can take to improve lung function and make yourself feel better. CAUSES   Smoking (common).  Exposure to secondhand smoke.  Genetic problems.  Chronic inflammatory lung diseases or recurrent infections. SYMPTOMS  Shortness of breath, especially with physical activity.  Deep, persistent (chronic) cough with a large amount of thick mucus.  Wheezing.  Rapid breaths (tachypnea).  Gray or bluish discoloration (cyanosis) of the skin, especially in your fingers, toes, or lips.  Fatigue.  Weight loss.  Frequent infections or episodes when breathing symptoms become much worse (exacerbations).  Chest tightness. DIAGNOSIS Your health care provider will take a medical history and perform a physical examination to diagnose COPD. Additional tests for COPD may include:  Lung (pulmonary) function tests.  Chest X-ray.  CT scan.  Blood tests. TREATMENT  Treatment for COPD may include:  Inhaler and nebulizer medicines. These help manage the symptoms of COPD and make your breathing more comfortable.  Supplemental oxygen. Supplemental oxygen is only helpful if you have a low oxygen level in your blood.  Exercise and physical activity. These are beneficial for nearly all people with COPD.  Lung surgery or transplant.  Nutrition therapy to gain weight, if you are underweight.  Pulmonary rehabilitation. This may involve working with a team of health care providers and specialists, such as respiratory, occupational, and physical therapists. HOME CARE INSTRUCTIONS  Take all medicines  (inhaled or pills) as directed by your health care provider.  Avoid over-the-counter medicines or cough syrups that dry up your airway (such as antihistamines) and slow down the elimination of secretions unless instructed otherwise by your health care provider.  If you are a smoker, the most important thing that you can do is stop smoking. Continuing to smoke will cause further lung damage and breathing trouble. Ask your health care provider for help with quitting smoking. He or she can direct you to community resources or hospitals that provide support.  Avoid exposure to irritants such as smoke, chemicals, and fumes that aggravate your breathing.  Use oxygen therapy and pulmonary rehabilitation if directed by your health care provider. If you require home oxygen therapy, ask your health care provider whether you should purchase a pulse oximeter to measure your oxygen level at home.  Avoid contact with individuals who have a contagious illness.  Avoid extreme temperature and humidity changes.  Eat healthy foods. Eating smaller, more frequent meals and resting before meals may help you maintain your strength.  Stay active, but balance activity with periods of rest. Exercise and physical activity will help you maintain your ability to do things you want to do.  Preventing infection and hospitalization is very important when you have COPD. Make sure to receive all the vaccines your health care provider recommends, especially the pneumococcal and influenza vaccines. Ask your health care provider whether you need a pneumonia vaccine.  Learn and use relaxation techniques to manage stress.  Learn and use controlled breathing techniques as directed by your health care provider. Controlled breathing techniques include:  Pursed lip breathing. Start by breathing in (inhaling) through your nose for 1 second. Then, purse your lips as if you were   going to whistle and breathe out (exhale) through the  pursed lips for 2 seconds.  Diaphragmatic breathing. Start by putting one hand on your abdomen just above your waist. Inhale slowly through your nose. The hand on your abdomen should move out. Then purse your lips and exhale slowly. You should be able to feel the hand on your abdomen moving in as you exhale.  Learn and use controlled coughing to clear mucus from your lungs. Controlled coughing is a series of short, progressive coughs. The steps of controlled coughing are: 1. Lean your head slightly forward. 2. Breathe in deeply using diaphragmatic breathing. 3. Try to hold your breath for 3 seconds. 4. Keep your mouth slightly open while coughing twice. 5. Spit any mucus out into a tissue. 6. Rest and repeat the steps once or twice as needed. SEEK MEDICAL CARE IF:  You are coughing up more mucus than usual.  There is a change in the color or thickness of your mucus.  Your breathing is more labored than usual.  Your breathing is faster than usual. SEEK IMMEDIATE MEDICAL CARE IF:  You have shortness of breath while you are resting.  You have shortness of breath that prevents you from:  Being able to talk.  Performing your usual physical activities.  You have chest pain lasting longer than 5 minutes.  Your skin color is more cyanotic than usual.  You measure low oxygen saturations for longer than 5 minutes with a pulse oximeter. MAKE SURE YOU:  Understand these instructions.  Will watch your condition.  Will get help right away if you are not doing well or get worse.   This information is not intended to replace advice given to you by your health care provider. Make sure you discuss any questions you have with your health care provider.   Document Released: 09/01/2005 Document Revised: 12/13/2014 Document Reviewed: 07/19/2013 Elsevier Interactive Patient Education 2016 [employer]  

## 2016-01-11 ENCOUNTER — Other Ambulatory Visit: Payer: Self-pay | Admitting: Internal Medicine

## 2016-01-17 ENCOUNTER — Other Ambulatory Visit: Payer: Self-pay | Admitting: Internal Medicine

## 2016-01-21 ENCOUNTER — Encounter: Payer: Self-pay | Admitting: Internal Medicine

## 2016-01-21 ENCOUNTER — Ambulatory Visit (INDEPENDENT_AMBULATORY_CARE_PROVIDER_SITE_OTHER): Payer: 59 | Admitting: Internal Medicine

## 2016-01-21 VITALS — BP 114/80 | HR 102 | Ht 62.0 in | Wt 200.2 lb

## 2016-01-21 DIAGNOSIS — J9611 Chronic respiratory failure with hypoxia: Secondary | ICD-10-CM | POA: Insufficient documentation

## 2016-01-21 DIAGNOSIS — J438 Other emphysema: Secondary | ICD-10-CM | POA: Diagnosis not present

## 2016-01-21 DIAGNOSIS — I2781 Cor pulmonale (chronic): Secondary | ICD-10-CM | POA: Diagnosis not present

## 2016-01-21 DIAGNOSIS — J9612 Chronic respiratory failure with hypercapnia: Secondary | ICD-10-CM | POA: Diagnosis not present

## 2016-01-21 NOTE — Patient Instructions (Signed)
ICD-9-CM ICD-10-CM   1. Chronic respiratory failure with hypoxia (HCC) 518.83 J96.11    799.02    2. Chronic respiratory failure with hypercapnia (HCC) 518.83 J96.12   3. Cor pulmonale (HCC) 416.9 I27.81   4. Other emphysema (HCC) 492.8 J43.8     Glad you're better after hospitalization earlier this month  Plan - Continue oxygen 24 7 and BiPAP at night - Change Spiriva to the respimat format and take it daily -Change Advair to Brio and take it once daily - Use albuterol as needed - Take new tablet called roflumilast 1 tablet   -first week take every other day  - after that take daily  - always take after food  - if causes nausea, vomit, call us -refer [facility] lung transplant eval  Followup  - 6 weeks with me my NP Tammy

## 2016-01-21 NOTE — Progress Notes (Signed)
Subjective:     Patient ID: Mary [name], female   DOB: [birth year], 40 y.o.   MRN: [id number]  HPI  Follow-up chronic hypoxemic and hypercapnic respiratory failure on oxygen and nocturnal BiPAP and triple inhaler therapy. Alpha 1 mm  She frequently misses appointments. Most recently and gender 2017 she had a rhinovirus related cor pulmonale decompensation and was admitted. She was diuresed aggressively. They did not think she had a COPD exacerbation according to the chart review. Her PCO2 was over 70. Since discharge she has improved a lot but she feels she is getting a little more fatigued in the last few days although she is denying any symptoms of COPD exacerbation or heart failure exacerbation. In fact she says her weight is down. She is compliant with her diuretics. She will see the heart failure clinic tomorrow. She says she's compliant with her inhalers. Her insurance status is now improved and she feels inhalers are all more affordable. She is willing to be evaluated by lung transplant [facility] although she's not lost weight and she is obese.   Current outpatient prescriptions:  .  ADVAIR DISKUS 250-50 MCG/DOSE AEPB, INHALE 1 PUFF INTO THE LUNGS EVERY 12 (TWELVE) HOURS., Disp: 60 each, Rfl: 3 .  albuterol (PROVENTIL HFA;VENTOLIN HFA) 108 (90 BASE) MCG/ACT inhaler, Inhale 2 puffs into the lungs every 6 (six) hours as needed for wheezing or shortness of breath. (Patient taking differently: Inhale 2 puffs into the lungs 4 (four) times daily. ), Disp: 8.5 g, Rfl: 1 .  docusate sodium (COLACE) 100 MG capsule, Take 2 capsules (200 mg total) by mouth at bedtime., Disp: 10 capsule, Rfl: 0 .  polyethylene glycol (MIRALAX / GLYCOLAX) packet, Take 17 g by mouth daily as needed for mild constipation., Disp: 14 each, Rfl: 0 .  SPIRIVA HANDIHALER 18 MCG inhalation capsule, PLACE 1 CAPSULE (18 MCG TOTAL) INTO INHALER AND INHALE DAILY., Disp: 30 capsule, Rfl: 2 .  torsemide (DEMADEX) 20 MG tablet, Take 1  tablet (20 mg total) by mouth 2 (two) times daily., Disp: 60 tablet, Rfl: 0   No Known Allergies   Immunization History  Administered Date(s) Administered  . Influenza Split 01/06/2011, 10/20/2012, 09/06/2013  . Influenza,inj,Quad PF,36+ Mos 09/27/2014, 09/16/2015  . Pneumococcal Conjugate-13 09/16/2015  . Pneumococcal Polysaccharide-23 01/06/2011  . Tdap 09/06/2013     Review of Systems According to history of present illness    Objective:   Physical Exam  Constitutional: She is oriented to person, place, and time. She appears well-developed and well-nourished. No distress.  Obese   HENT:  Head: Normocephalic and atraumatic.  Right Ear: External ear normal.  Left Ear: External ear normal.  Mouth/Throat: Oropharynx is clear and moist. No oropharyngeal exudate.  o2 on  Eyes: Conjunctivae and EOM are normal. Pupils are equal, round, and reactive to light. Right eye exhibits no discharge. Left eye exhibits no discharge. No scleral icterus.  Neck: Normal range of motion. Neck supple. No JVD present. No tracheal deviation present. No thyromegaly present.  Cardiovascular: Normal rate, regular rhythm, normal heart sounds and intact distal pulses.  Exam reveals no gallop and no friction rub.   No murmur heard. Pulmonary/Chest: Effort normal and breath sounds normal. No respiratory distress. She has no wheezes. She has no rales. She exhibits no tenderness.  Abdominal: Soft. Bowel sounds are normal. She exhibits no distension and no mass. There is no tenderness. There is no rebound and no guarding.  Musculoskeletal: Normal range of motion. She exhibits no edema  or tenderness.  Lymphadenopathy:    She has no cervical adenopathy.  Neurological: She is alert and oriented to person, place, and time. She has normal reflexes. No cranial nerve deficit. She exhibits normal muscle tone. Coordination normal.  Skin: Skin is warm and dry. No rash noted. She is not diaphoretic. No erythema. No  pallor.  Psychiatric: She has a normal mood and affect. Her behavior is normal. Judgment and thought content normal.  Vitals reviewed.   Filed Vitals:   01/21/16 1654  BP: 114/80  Pulse: 102  Height:  (1.575 m)  Weight: 200 lb 3.2 oz (90.81 kg)  SpO2: 94%         Assessment:       ICD-9-CM ICD-10-CM   1. Chronic respiratory failure with hypoxia (HCC) 518.83 J96.11    799.02    2. Chronic respiratory failure with hypercapnia (HCC) 518.83 J96.12   3. Cor pulmonale (HCC) 416.9 I27.81   4. Other emphysema (HCC) 492.8 J43.8    [name] COPD situation is terrible. Prognosis very poor. She misses many appointments. The only thing I could do at this point is to try to change her inhalers around and maybe try Daliresp. I again recommended that she at least visit lung transplant evaluation. At this point she's not a candidate for transplant because of obesity and lack of pulmonary rehabilitation but if she were to have a visit with the maybe she'll get inspired to lose weight and attend pulmonary rehabilitation.  She has some worsening fatigue. Do not know what to make of it. She'll be seen by the heart failure clinic tomorrow. Given her lack of wheezing she is not interested in doing an empiric prednisone or antibiotic.    Plan:     Glad you're better after hospitalization earlier this month  Plan - Continue oxygen 24 7 and BiPAP at night - Change Spiriva to the respimat format and take it daily -Change Advair to Brio and take it once daily - Use albuterol as needed - Take new tablet called roflumilast 1 tablet   -first week take every other day  - after that take daily  - always take after food  - if causes nausea, vomit, call us -refer [facility] lung transplant eval  Followup  - 6 weeks with me my NP Tammy   > 50% of this > 25 min visit spent in face to face counseling or coordination of care    Dr. Kalman Shan, M.D., [facility].C.P Pulmonary and Critical  Care Medicine Staff Physician [facility] [place] [facility] Pager: [phone], If no answer or between  15:00h - 7:00h: call [phone]  01/21/2016 5:15 PM

## 2016-01-22 ENCOUNTER — Ambulatory Visit (HOSPITAL_COMMUNITY)
Admission: RE | Admit: 2016-01-22 | Discharge: 2016-01-22 | Disposition: A | Discharge status: Home / Self Care | Payer: 59 | Source: Ambulatory Visit | Attending: Internal Medicine | Admitting: Internal Medicine

## 2016-01-22 ENCOUNTER — Encounter (HOSPITAL_COMMUNITY): Payer: Self-pay | Admitting: Internal Medicine

## 2016-01-22 VITALS — BP 112/68 | HR 92 | Wt 202.5 lb

## 2016-01-22 DIAGNOSIS — J9610 Chronic respiratory failure, unspecified whether with hypoxia or hypercapnia: Secondary | ICD-10-CM | POA: Insufficient documentation

## 2016-01-22 DIAGNOSIS — I5032 Chronic diastolic (congestive) heart failure: Secondary | ICD-10-CM | POA: Diagnosis not present

## 2016-01-22 DIAGNOSIS — I2781 Cor pulmonale (chronic): Secondary | ICD-10-CM | POA: Insufficient documentation

## 2016-01-22 DIAGNOSIS — Z86711 Personal history of pulmonary embolism: Secondary | ICD-10-CM | POA: Insufficient documentation

## 2016-01-22 DIAGNOSIS — Z9981 Dependence on supplemental oxygen: Secondary | ICD-10-CM | POA: Insufficient documentation

## 2016-01-22 DIAGNOSIS — J449 Chronic obstructive pulmonary disease, unspecified: Secondary | ICD-10-CM | POA: Diagnosis not present

## 2016-01-22 DIAGNOSIS — Z87891 Personal history of nicotine dependence: Secondary | ICD-10-CM | POA: Insufficient documentation

## 2016-01-22 DIAGNOSIS — G4733 Obstructive sleep apnea (adult) (pediatric): Secondary | ICD-10-CM | POA: Diagnosis not present

## 2016-01-22 DIAGNOSIS — J9611 Chronic respiratory failure with hypoxia: Secondary | ICD-10-CM

## 2016-01-22 DIAGNOSIS — I2721 Secondary pulmonary arterial hypertension: Secondary | ICD-10-CM

## 2016-01-22 DIAGNOSIS — Z86718 Personal history of other venous thrombosis and embolism: Secondary | ICD-10-CM | POA: Diagnosis not present

## 2016-01-22 DIAGNOSIS — Z79899 Other long term (current) drug therapy: Secondary | ICD-10-CM | POA: Diagnosis not present

## 2016-01-22 DIAGNOSIS — I272 Other secondary pulmonary hypertension: Secondary | ICD-10-CM | POA: Insufficient documentation

## 2016-01-22 LAB — BASIC METABOLIC PANEL
ANION GAP: 14 (ref 5–15)
BUN: 6 mg/dL (ref 6–20)
CALCIUM: 9.4 mg/dL (ref 8.9–10.3)
CO2: 29 mmol/L (ref 22–32)
CREATININE: 0.49 mg/dL (ref 0.44–1.00)
Chloride: 94 mmol/L — ABNORMAL LOW (ref 101–111)
GFR calc Af Amer: >60 mL/min (ref >60)
Glucose, Bld: 157 mg/dL — ABNORMAL HIGH (ref 65–99)
Potassium: 3.4 mmol/L — ABNORMAL LOW (ref 3.5–5.1)
Sodium: 137 mmol/L (ref 135–145)

## 2016-01-22 NOTE — Patient Instructions (Signed)
Lab today  We will contact you in 4 months to schedule your next appointment.  

## 2016-01-22 NOTE — Progress Notes (Signed)
ADVANCED HF CLINIC NOTE  Patient ID: Mary [name], female   DOB: 1976/03/25, 40 y.o.   MRN: [id number] Primary Cardiologist:Mikela Senn  HPI:  Mary [name] is a 40 y.o. female with history of morbid obesity, severe COPD/ILD on home oxygen 2-3 L,  OSA, S/P lung surgery, PAH with Cor Pulmonale, DVT Rt Subclavian, PE, and h/o alcohol abuse  Recently admitted to [facility] in January 2017 with recurrent respiratory failure and volume overload. Improved with diuresis.   CXR negative for acute pulmonary disease. Had CTA on previous admission 12/16/15 negative for PE. ABG showed PH 7.23, PC02 of 83, p02 59, Bicarbonate 35.1, total CO2 38, and 02 sats 93. Subsequent ABGs with slight improvement on Bipap. After diuresis underwent RHC on 01/08/16. Weight on d/c 202. Switched from lasix to torsemide.   RHC 01/08/16 RA = 11 RV = 41/4/8 PA = 45/16 (35) PCW = 11 Fick cardiac output/index = 4.5/2.4 PVR = 5.3 WU FA sat = 80% PA sat = 51%, 50  Returns for post-hospital f/u. Feeling much better. No edema, orthopnea, CP or PND.  Still tired. Using Bipap. Taking torsemide 20 bid. Keeping weight off. Saw Dr. Marchelle Gearing and started on new ILD medicine.  Doesn't have a scale. If takes O2 off sats fall quickly into the 70s. Referred back to [facility] for lung transplant.   ECHO 09/12/14 LVEF 55-60%, PA peak pressure 13 mm Hg ECHO 02/28/13 LVEF 60%, RV mildly dilated ECHO 04/12/12 LVEF 55-60%, RV mod/severely dilated  ECHO 1/17: LVEF 55- 60%, RV mildly dilated RVSP 44    ROS: All systems negative except as listed in HPI, PMH and Problem List.  SH:  Social History   Social History  . Marital Status: Married    Spouse Name: N/A  . Number of Children: 0  . Years of Education: college   Occupational History  . Unemployed     [employer]   Social History Main Topics  . Smoking status: Former Smoker -- 1.00 packs/day for 25 years    Types: Cigarettes    Quit date: 02/18/2012  . Smokeless tobacco: Never Used   . Alcohol Use: No     Comment: previously drinking 1 pint 3-4 days a week for 2002-2013, but in 2014, no longer drinking  . Drug Use: No  . Sexual Activity:    Partners: Male   Other Topics Concern  . Not on file   Social History Narrative   Lives with husband in [place]   Has a farm with multiple animals including chickens, [place] pigs, cockatil (bird), dogs and cats   Completed GED and 2 years of college   Smoking-quit smoking >1 year since 2014   Not working     FH:  Family History  Problem Relation Age of Onset  . Hypertension Father   . Heart disease Father     CHF; died age 94   . Alcohol abuse Father   . Other Brother     died age 34 y.o overdose    Past Medical History  Diagnosis Date  . COPD (chronic obstructive pulmonary disease) (HCC)     PFTS 03/08/12: fev1 0.58L/1%, FVC 1.18/33%, Ratop 49 and c/w ssevere obstruction. 21% BD response on FVC, RV 219%, DLCO 11/54%  . History of alcoholism (HCC)   . Irregular menses   . DVT of upper extremity (deep vein thrombosis) [facility]) April 2013    right subclavian // Unclear precipitating cause - possibly significant right heart failure, with vascular  stasis potentially predisposing to clotting.    . Pulmonary embolus [facility]) April 2013    Precipitating cause unclear. Was treated with coumadin from April-June 2013.  . Moderate to severe pulmonary hypertension [facility]) April 2013    Cardiac cath on 03/06/12 - 1. Elevated pulmonary artery pressures, right sided filling pressures.,  2. PA: 64/45 (mean 53)    . Hepatic cirrhosis (HCC)     Questionable history of - Noted on CT abdomen (03/2012) - thought to be due to vascular congestion from right heart failure +/- patient's history of alcohol abuse  . Axillary adenopathy     right axillary adenopathy noted on CT chest (03/06/2012)  . Periodontal disease   . Chronic right-sided CHF (congestive heart failure) (HCC) 03/23/2012    with cor pulmonale. Last RHC 03/2012  . Pneumonia ~  1985; 01/2011  . History of chronic bronchitis   . Exertional shortness of breath   . On home oxygen therapy     "2-3 L 24/7" (05/03/2013)  . OSA (obstructive sleep apnea)     wears noctural BiPAP (05/03/2013)  . Migraine     "~ 1/yr" (05/03/2013)    Current Outpatient Prescriptions  Medication Sig Dispense Refill  . albuterol (PROVENTIL HFA;VENTOLIN HFA) 108 (90 BASE) MCG/ACT inhaler Inhale 2 puffs into the lungs every 6 (six) hours as needed for wheezing or shortness of breath. (Patient taking differently: Inhale 2 puffs into the lungs 4 (four) times daily. ) 8.5 g 1  . polyethylene glycol (MIRALAX / GLYCOLAX) packet Take 17 g by mouth daily as needed for mild constipation. 14 each 0  . Tiotropium Bromide Monohydrate (SPIRIVA RESPIMAT) 1.25 MCG/ACT AERS Inhale into the lungs.    . torsemide (DEMADEX) 20 MG tablet Take 1 tablet (20 mg total) by mouth 2 (two) times daily. 60 tablet 0   No current facility-administered medications for this encounter.    Filed Vitals:   01/22/16 1027  BP: 112/68  Pulse: 92  Weight: 202 lb 8 oz (91.853 kg)  SpO2: 95%    PHYSICAL EXAM:  General: Obese woman Hoarse, NAD HEENT: normal Neck: supple. JVP 6-7. Difficult to assess. Carotids 2+ bilat; no bruits. No thyromegaly or lymphadenopathy noted. Cor: Distant PMI nonpalpable. Regular rate & rhythm. 2/6 TR Lungs: Markedly diminished throughout. No wheeze Normal effort Abdomen: Obese, NT, no HSM. No bruits or masses. +BS  Extremities: no cyanosis or rash. +clubbing bilaterally.  No edema Neuro: alert & orientedx3, cranial nerves grossly intact. moves all 4 extremities w/o difficulty. Affect pleasant   ASSESSMENT & PLAN: 1. Chronic diastolic HF 2. Chronic respiratory failure 3. PAH, mild - WHO group II & III 4. Obesity 5. ILD 6. H/o DVT/PE  Overall much improved since hospitalization. Weight is down. Fluid status looks good. We will provide her with scale in clinic. Reinforced need for daily  weights and reviewed use of sliding scale diuretics. PAH is mild and WHO Group II & III so not candidate for selective pulmonary vasodilators. RTC in 4 months.    Jirah Rider,MD 6:09 PM

## 2016-01-25 DIAGNOSIS — I5032 Chronic diastolic (congestive) heart failure: Secondary | ICD-10-CM | POA: Insufficient documentation

## 2016-01-29 ENCOUNTER — Telehealth (HOSPITAL_COMMUNITY): Payer: Self-pay | Admitting: *Deleted

## 2016-01-29 MED ORDER — POTASSIUM CHLORIDE CRYS ER 20 MEQ PO TBCR: 20.0000 meq | EXTENDED_RELEASE_TABLET | Freq: Every day | ORAL | Status: DC

## 2016-01-29 NOTE — Telephone Encounter (Signed)
Notes Recorded by Noralee Space, RN on 01/29/2016 at 11:12 AM Pt aware and agreeable, rx sent in, repeat labs sch for 3/6

## 2016-01-29 NOTE — Telephone Encounter (Signed)
-----   Message from Dolores Patty, MD sent at 01/29/2016 12:09 AM EST ----- Add 20 kcl daily. Take 40 the first day. Recehck 2 weeks.

## 2016-02-02 ENCOUNTER — Telehealth: Payer: Self-pay | Admitting: Internal Medicine

## 2016-02-02 MED ORDER — TIOTROPIUM BROMIDE MONOHYDRATE 1.25 MCG/ACT IN AERS: 2.0000 | INHALATION_SPRAY | Freq: Two times a day (BID) | RESPIRATORY_TRACT | Status: DC

## 2016-02-02 MED ORDER — ROFLUMILAST 500 MCG PO TABS: ORAL_TABLET | ORAL | Status: DC

## 2016-02-02 MED ORDER — FLUTICASONE FUROATE-VILANTEROL 100-25 MCG/INH IN AEPB: 1.0000 | INHALATION_SPRAY | Freq: Every day | RESPIRATORY_TRACT | Status: DC

## 2016-02-02 NOTE — Telephone Encounter (Signed)
ICD-9-CM ICD-10-CM    1. Chronic respiratory failure with hypoxia (HCC) 518.83 J96.11    799.02    2. Chronic respiratory failure with hypercapnia (HCC) 518.83 J96.12   3. Cor pulmonale (HCC) 416.9 I27.81   4. Other emphysema (HCC) 492.8 J43.8     Glad you're better after hospitalization earlier this month  Plan - Continue oxygen 24 7 and BiPAP at night - Change Spiriva to the respimat format and take it daily -Change Advair to Brio and take it once daily - Use albuterol as needed - Take new tablet called roflumilast 1 tablet  -first week take every other day - after that take daily - always take after food - if causes nausea, vomit, call us -refer [facility] lung transplant eval  Followup - 6 weeks with me my NP Tammy   Called and spoke with pt. She states that the spiriva respimat, Breo, and roflumilast was not sent into the pharmacy. I apologized and informed her that I would send the med's as we speak. I verified pharmacy as [facility] in [place]. She voiced understanding and had no further questions. Nothing further needed.

## 2016-02-03 ENCOUNTER — Telehealth: Payer: Self-pay | Admitting: *Deleted

## 2016-02-03 NOTE — Telephone Encounter (Signed)
Initiated PA for [employer] thru CMM. KeySatira Mccallum [id number]  Sent for review

## 2016-02-04 NOTE — Telephone Encounter (Signed)
Daliresp has been approved until 02/02/2017 or until coverage is no longer available. Pharmacy informed. [id number]

## 2016-02-09 ENCOUNTER — Other Ambulatory Visit (HOSPITAL_COMMUNITY): Payer: 59

## 2016-02-17 ENCOUNTER — Ambulatory Visit: Payer: 59 | Admitting: Internal Medicine

## 2016-02-28 ENCOUNTER — Other Ambulatory Visit: Payer: Self-pay | Admitting: Internal Medicine

## 2016-03-04 ENCOUNTER — Ambulatory Visit: Payer: 59 | Admitting: Adult Health

## 2016-04-01 ENCOUNTER — Inpatient Hospital Stay (HOSPITAL_COMMUNITY)
Admission: EM | Admit: 2016-04-01 | Discharge: 2016-04-11 | DRG: 189 | Disposition: A | Discharge status: Home / Self Care | Payer: 59 | Attending: Internal Medicine | Admitting: Internal Medicine

## 2016-04-01 ENCOUNTER — Encounter (HOSPITAL_COMMUNITY): Payer: Self-pay | Admitting: *Deleted

## 2016-04-01 ENCOUNTER — Emergency Department (HOSPITAL_COMMUNITY): Payer: 59

## 2016-04-01 DIAGNOSIS — J441 Chronic obstructive pulmonary disease with (acute) exacerbation: Secondary | ICD-10-CM | POA: Diagnosis not present

## 2016-04-01 DIAGNOSIS — Z6834 Body mass index (BMI) 34.0-34.9, adult: Secondary | ICD-10-CM

## 2016-04-01 DIAGNOSIS — R000 Tachycardia, unspecified: Secondary | ICD-10-CM

## 2016-04-01 DIAGNOSIS — J440 Chronic obstructive pulmonary disease with acute lower respiratory infection: Secondary | ICD-10-CM | POA: Diagnosis present

## 2016-04-01 DIAGNOSIS — K7460 Unspecified cirrhosis of liver: Secondary | ICD-10-CM | POA: Diagnosis present

## 2016-04-01 DIAGNOSIS — Z8249 Family history of ischemic heart disease and other diseases of the circulatory system: Secondary | ICD-10-CM

## 2016-04-01 DIAGNOSIS — I5033 Acute on chronic diastolic (congestive) heart failure: Secondary | ICD-10-CM | POA: Diagnosis present

## 2016-04-01 DIAGNOSIS — J9621 Acute and chronic respiratory failure with hypoxia: Secondary | ICD-10-CM | POA: Diagnosis not present

## 2016-04-01 DIAGNOSIS — J9622 Acute and chronic respiratory failure with hypercapnia: Secondary | ICD-10-CM | POA: Diagnosis present

## 2016-04-01 DIAGNOSIS — G4733 Obstructive sleep apnea (adult) (pediatric): Secondary | ICD-10-CM | POA: Diagnosis present

## 2016-04-01 DIAGNOSIS — R0602 Shortness of breath: Secondary | ICD-10-CM | POA: Diagnosis not present

## 2016-04-01 DIAGNOSIS — Z9981 Dependence on supplemental oxygen: Secondary | ICD-10-CM

## 2016-04-01 DIAGNOSIS — Z86711 Personal history of pulmonary embolism: Secondary | ICD-10-CM

## 2016-04-01 DIAGNOSIS — T17990A Other foreign object in respiratory tract, part unspecified in causing asphyxiation, initial encounter: Secondary | ICD-10-CM | POA: Diagnosis present

## 2016-04-01 DIAGNOSIS — J209 Acute bronchitis, unspecified: Secondary | ICD-10-CM | POA: Diagnosis present

## 2016-04-01 DIAGNOSIS — Z79899 Other long term (current) drug therapy: Secondary | ICD-10-CM

## 2016-04-01 DIAGNOSIS — E876 Hypokalemia: Secondary | ICD-10-CM | POA: Diagnosis present

## 2016-04-01 DIAGNOSIS — E1165 Type 2 diabetes mellitus with hyperglycemia: Secondary | ICD-10-CM | POA: Diagnosis present

## 2016-04-01 DIAGNOSIS — I5032 Chronic diastolic (congestive) heart failure: Secondary | ICD-10-CM | POA: Diagnosis present

## 2016-04-01 DIAGNOSIS — E1169 Type 2 diabetes mellitus with other specified complication: Secondary | ICD-10-CM

## 2016-04-01 DIAGNOSIS — I272 Other secondary pulmonary hypertension: Secondary | ICD-10-CM | POA: Diagnosis present

## 2016-04-01 DIAGNOSIS — T380X5A Adverse effect of glucocorticoids and synthetic analogues, initial encounter: Secondary | ICD-10-CM | POA: Diagnosis present

## 2016-04-01 DIAGNOSIS — Z86718 Personal history of other venous thrombosis and embolism: Secondary | ICD-10-CM

## 2016-04-01 DIAGNOSIS — Z87891 Personal history of nicotine dependence: Secondary | ICD-10-CM

## 2016-04-01 DIAGNOSIS — E669 Obesity, unspecified: Secondary | ICD-10-CM

## 2016-04-01 DIAGNOSIS — I2781 Cor pulmonale (chronic): Secondary | ICD-10-CM | POA: Diagnosis present

## 2016-04-01 DIAGNOSIS — F1020 Alcohol dependence, uncomplicated: Secondary | ICD-10-CM | POA: Diagnosis present

## 2016-04-01 LAB — I-STAT VENOUS BLOOD GAS, ED
Acid-Base Excess: 8 mmol/L — ABNORMAL HIGH (ref 0.0–2.0)
Bicarbonate: 35.1 mEq/L — ABNORMAL HIGH (ref 20.0–24.0)
O2 SAT: 85 %
TCO2: 37 mmol/L (ref 0–100)
pCO2, Ven: 57.6 mmHg — ABNORMAL HIGH (ref 45.0–50.0)
pH, Ven: 7.393 — ABNORMAL HIGH (ref 7.250–7.300)
pO2, Ven: 52 mmHg — ABNORMAL HIGH (ref 31.0–45.0)

## 2016-04-01 LAB — CBC
HCT: 46.3 % — ABNORMAL HIGH (ref 36.0–46.0)
Hemoglobin: 15.1 g/dL — ABNORMAL HIGH (ref 12.0–15.0)
MCH: 32.6 pg (ref 26.0–34.0)
MCHC: 32.6 g/dL (ref 30.0–36.0)
MCV: 100 fL (ref 78.0–100.0)
PLATELETS: 155 10*3/uL (ref 150–400)
RBC: 4.63 MIL/uL (ref 3.87–5.11)
RDW: 13.1 % (ref 11.5–15.5)
WBC: 7.1 10*3/uL (ref 4.0–10.5)

## 2016-04-01 LAB — BASIC METABOLIC PANEL
Anion gap: 11 (ref 5–15)
BUN: 6 mg/dL (ref 6–20)
CALCIUM: 9.4 mg/dL (ref 8.9–10.3)
CO2: 29 mmol/L (ref 22–32)
Chloride: 96 mmol/L — ABNORMAL LOW (ref 101–111)
Creatinine, Ser: 0.63 mg/dL (ref 0.44–1.00)
GFR calc Af Amer: >60 mL/min (ref >60)
GLUCOSE: 122 mg/dL — AB (ref 65–99)
Potassium: 3.6 mmol/L (ref 3.5–5.1)
SODIUM: 136 mmol/L (ref 135–145)

## 2016-04-01 LAB — I-STAT TROPONIN, ED: TROPONIN I, POC: 0 ng/mL (ref 0.00–0.08)

## 2016-04-01 LAB — BRAIN NATRIURETIC PEPTIDE: B NATRIURETIC PEPTIDE 5: 19.4 pg/mL (ref 0.0–100.0)

## 2016-04-01 MED ORDER — ALBUTEROL SULFATE (2.5 MG/3ML) 0.083% IN NEBU
2.5000 mg | INHALATION_SOLUTION | RESPIRATORY_TRACT | Status: DC | PRN
  Administered 2016-04-02: 2.5 mg via RESPIRATORY_TRACT
  Filled 2016-04-01: qty 3

## 2016-04-01 MED ORDER — IPRATROPIUM BROMIDE 0.02 % IN SOLN
0.5000 mg | Freq: Once | RESPIRATORY_TRACT | Status: AC
  Administered 2016-04-01: 0.5 mg via RESPIRATORY_TRACT
  Filled 2016-04-01: qty 2.5

## 2016-04-01 MED ORDER — SODIUM CHLORIDE 0.9% FLUSH
3.0000 mL | Freq: Two times a day (BID) | INTRAVENOUS | Status: DC
  Administered 2016-04-02 – 2016-04-04 (×6): 3 mL via INTRAVENOUS

## 2016-04-01 MED ORDER — ENOXAPARIN SODIUM 40 MG/0.4ML ~~LOC~~ SOLN
40.0000 mg | SUBCUTANEOUS | Status: DC
  Administered 2016-04-02 – 2016-04-11 (×10): 40 mg via SUBCUTANEOUS
  Filled 2016-04-01 (×11): qty 0.4

## 2016-04-01 MED ORDER — POLYETHYLENE GLYCOL 3350 17 G PO PACK
17.0000 g | PACK | Freq: Every day | ORAL | Status: DC | PRN
  Filled 2016-04-01: qty 1

## 2016-04-01 MED ORDER — PREDNISONE 20 MG PO TABS
60.0000 mg | ORAL_TABLET | Freq: Once | ORAL | Status: AC
  Administered 2016-04-01: 60 mg via ORAL
  Filled 2016-04-01: qty 3

## 2016-04-01 MED ORDER — TIOTROPIUM BROMIDE MONOHYDRATE 1.25 MCG/ACT IN AERS: 2.0000 | INHALATION_SPRAY | Freq: Two times a day (BID) | RESPIRATORY_TRACT | Status: DC

## 2016-04-01 MED ORDER — BUDESONIDE 0.5 MG/2ML IN SUSP
1.0000 mg | Freq: Two times a day (BID) | RESPIRATORY_TRACT | Status: DC
  Administered 2016-04-02: 1 mg via RESPIRATORY_TRACT
  Filled 2016-04-01: qty 4

## 2016-04-01 MED ORDER — ACETAMINOPHEN 650 MG RE SUPP: 650.0000 mg | Freq: Four times a day (QID) | RECTAL | Status: DC | PRN

## 2016-04-01 MED ORDER — METHYLPREDNISOLONE SODIUM SUCC 125 MG IJ SOLR
60.0000 mg | Freq: Three times a day (TID) | INTRAMUSCULAR | Status: DC
  Administered 2016-04-02 – 2016-04-04 (×7): 60 mg via INTRAVENOUS
  Filled 2016-04-01 (×8): qty 2

## 2016-04-01 MED ORDER — ALBUTEROL SULFATE (2.5 MG/3ML) 0.083% IN NEBU
2.5000 mg | INHALATION_SOLUTION | RESPIRATORY_TRACT | Status: DC
  Administered 2016-04-01 – 2016-04-02 (×3): 2.5 mg via RESPIRATORY_TRACT
  Filled 2016-04-01 (×3): qty 3

## 2016-04-01 MED ORDER — IPRATROPIUM-ALBUTEROL 0.5-2.5 (3) MG/3ML IN SOLN
3.0000 mL | Freq: Once | RESPIRATORY_TRACT | Status: AC
  Administered 2016-04-01: 3 mL via RESPIRATORY_TRACT
  Filled 2016-04-01: qty 3

## 2016-04-01 MED ORDER — TORSEMIDE 20 MG PO TABS
20.0000 mg | ORAL_TABLET | Freq: Two times a day (BID) | ORAL | Status: DC
  Administered 2016-04-02: 20 mg via ORAL
  Filled 2016-04-01: qty 1

## 2016-04-01 MED ORDER — GUAIFENESIN ER 600 MG PO TB12
600.0000 mg | ORAL_TABLET | Freq: Two times a day (BID) | ORAL | Status: DC
  Administered 2016-04-02 – 2016-04-10 (×17): 600 mg via ORAL
  Filled 2016-04-01 (×19): qty 1

## 2016-04-01 MED ORDER — ACETAMINOPHEN 325 MG PO TABS
650.0000 mg | ORAL_TABLET | Freq: Four times a day (QID) | ORAL | Status: DC | PRN
  Administered 2016-04-02 – 2016-04-11 (×7): 650 mg via ORAL
  Filled 2016-04-01 (×8): qty 2

## 2016-04-01 MED ORDER — ROFLUMILAST 500 MCG PO TABS
500.0000 ug | ORAL_TABLET | Freq: Every day | ORAL | Status: DC
  Administered 2016-04-02 – 2016-04-11 (×10): 500 ug via ORAL
  Filled 2016-04-01 (×11): qty 1

## 2016-04-01 MED ORDER — ALBUTEROL SULFATE (2.5 MG/3ML) 0.083% IN NEBU
5.0000 mg | INHALATION_SOLUTION | Freq: Once | RESPIRATORY_TRACT | Status: AC
  Administered 2016-04-01: 5 mg via RESPIRATORY_TRACT
  Filled 2016-04-01: qty 6

## 2016-04-01 MED ORDER — ARFORMOTEROL TARTRATE 15 MCG/2ML IN NEBU
15.0000 ug | INHALATION_SOLUTION | Freq: Two times a day (BID) | RESPIRATORY_TRACT | Status: DC
  Administered 2016-04-02 – 2016-04-04 (×5): 15 ug via RESPIRATORY_TRACT
  Filled 2016-04-01 (×6): qty 2

## 2016-04-01 MED ORDER — ONDANSETRON HCL 4 MG/2ML IJ SOLN
4.0000 mg | Freq: Four times a day (QID) | INTRAMUSCULAR | Status: DC | PRN
  Administered 2016-04-04 – 2016-04-08 (×3): 4 mg via INTRAVENOUS
  Filled 2016-04-01 (×3): qty 2

## 2016-04-01 MED ORDER — BENZONATATE 100 MG PO CAPS
200.0000 mg | ORAL_CAPSULE | Freq: Three times a day (TID) | ORAL | Status: DC | PRN
  Administered 2016-04-03 – 2016-04-04 (×3): 200 mg via ORAL
  Filled 2016-04-01 (×3): qty 2

## 2016-04-01 MED ORDER — POTASSIUM CHLORIDE CRYS ER 20 MEQ PO TBCR
20.0000 meq | EXTENDED_RELEASE_TABLET | Freq: Every day | ORAL | Status: DC
  Administered 2016-04-02: 20 meq via ORAL
  Filled 2016-04-01: qty 1

## 2016-04-01 MED ORDER — METHYLPREDNISOLONE SODIUM SUCC 125 MG IJ SOLR
125.0000 mg | Freq: Once | INTRAMUSCULAR | Status: AC
  Administered 2016-04-01: 125 mg via INTRAVENOUS
  Filled 2016-04-01: qty 2

## 2016-04-01 MED ORDER — ONDANSETRON HCL 4 MG PO TABS: 4.0000 mg | ORAL_TABLET | Freq: Four times a day (QID) | ORAL | Status: DC | PRN

## 2016-04-01 MED ORDER — ASPIRIN 81 MG PO CHEW
81.0000 mg | CHEWABLE_TABLET | ORAL | Status: AC
  Administered 2016-04-01: 81 mg via ORAL
  Filled 2016-04-01: qty 1

## 2016-04-01 MED ORDER — BENZONATATE 100 MG PO CAPS
200.0000 mg | ORAL_CAPSULE | Freq: Once | ORAL | Status: AC
  Administered 2016-04-01: 200 mg via ORAL
  Filled 2016-04-01: qty 2

## 2016-04-01 NOTE — ED Provider Notes (Signed)
CSN: [id number]     Arrival date & time 04/01/16  1736 History   First MD Initiated Contact with Patient 04/01/16 1756     Chief Complaint  Patient presents with  . Shortness of Breath     (Consider location/radiation/quality/duration/timing/severity/associated sxs/prior Treatment) HPI This is a 40 year old female with a history of severe COPD, pulmonary hypertension, cor pulmonale who presents with shortness of breath. She reports 3 days of change in her normal sputum production, increased shortness of breath, and persistent cough. She says that she checks her pulse ox regularly at home, and with ambulation and is been dropping into the 70s, and she has had a significant decrease in her exercise tolerance. She denies any fevers or chills. She has not had no hemoptysis. She has had no unilateral leg swelling. She has recently been worked up for a PE, and it was negative.   Past Medical History  Diagnosis Date  . COPD (chronic obstructive pulmonary disease) (HCC)     PFTS 03/08/12: fev1 0.58L/1%, FVC 1.18/33%, Ratop 49 and c/w ssevere obstruction. 21% BD response on FVC, RV 219%, DLCO 11/54%  . History of alcoholism (HCC)   . Irregular menses   . DVT of upper extremity (deep vein thrombosis) [facility]) April 2013    right subclavian // Unclear precipitating cause - possibly significant right heart failure, with vascular stasis potentially predisposing to clotting.    . Pulmonary embolus [facility]) April 2013    Precipitating cause unclear. Was treated with coumadin from April-June 2013.  . Moderate to severe pulmonary hypertension [facility]) April 2013    Cardiac cath on 03/06/12 - 1. Elevated pulmonary artery pressures, right sided filling pressures.,  2. PA: 64/45 (mean 53)    . Hepatic cirrhosis (HCC)     Questionable history of - Noted on CT abdomen (03/2012) - thought to be due to vascular congestion from right heart failure +/- patient's history of alcohol abuse  . Axillary adenopathy     right  axillary adenopathy noted on CT chest (03/06/2012)  . Periodontal disease   . Chronic right-sided CHF (congestive heart failure) (HCC) 03/23/2012    with cor pulmonale. Last RHC 03/2012  . Pneumonia ~ 1985; 01/2011  . History of chronic bronchitis   . Exertional shortness of breath   . On home oxygen therapy     "2-3 L 24/7" (05/03/2013)  . OSA (obstructive sleep apnea)     wears noctural BiPAP (05/03/2013)  . Migraine     "~ 1/yr" (05/03/2013)   Past Surgical History  Procedure Laterality Date  . Cardiac surgery  11/20/77    "my heart was backwards" (05/03/2013)  . Lung surgery  1976-12-08  . Appendectomy  05/03/2013  . Cardiac catheterization  03/2012  . Laparoscopic appendectomy N/A 05/03/2013    Procedure: APPENDECTOMY LAPAROSCOPIC;  Surgeon: Cherylynn Ridges, MD;  Location: [facility] OR;  Service: General;  Laterality: N/A;  . Right heart catheterization N/A 03/07/2012    Procedure: RIGHT HEART CATH;  Surgeon: Kathleene Hazel, MD;  Location: [facility] CATH LAB;  Service: Cardiovascular;  Laterality: N/A;  . Cardiac catheterization N/A 01/08/2016    Procedure: Right Heart Cath;  Surgeon: Dolores Patty, MD;  Location: [facility] INVASIVE CV LAB;  Service: Cardiovascular;  Laterality: N/A;   Family History  Problem Relation Age of Onset  . Hypertension Father   . Heart disease Father     CHF; died age 30   . Alcohol abuse Father   . Other Brother  died age 39 y.o overdose   Social History  Substance Use Topics  . Smoking status: Former Smoker -- 1.00 packs/day for 25 years    Types: Cigarettes    Quit date: 02/18/2012  . Smokeless tobacco: Never Used  . Alcohol Use: No     Comment: previously drinking 1 pint 3-4 days a week for 2002-2013, but in 2014, no longer drinking   OB History    No data available     Review of Systems  Constitutional: Negative for fever, chills and fatigue.  Respiratory: Positive for cough, shortness of breath and wheezing.   Cardiovascular: Negative for leg  swelling.  All other systems reviewed and are negative.     Allergies  Review of patient's allergies indicates no known allergies.  Home Medications   Prior to Admission medications   Medication Sig Start Date End Date Taking? Authorizing Provider  albuterol (PROVENTIL HFA;VENTOLIN HFA) 108 (90 BASE) MCG/ACT inhaler Inhale 2 puffs into the lungs every 6 (six) hours as needed for wheezing or shortness of breath. Patient taking differently: Inhale 2 puffs into the lungs 4 (four) times daily.  09/06/14  Yes Kalman ShanMurali Ramaswamy, MD  fluticasone furoate-vilanterol (BREO ELLIPTA) 100-25 MCG/INH AEPB Inhale 1 puff into the lungs daily. 02/02/16  Yes Kalman ShanMurali Ramaswamy, MD  polyethylene glycol (MIRALAX / GLYCOLAX) packet Take 17 g by mouth daily as needed for mild constipation. 01/09/16  Yes Lonia BloodJeffrey T McClung, MD  potassium chloride SA (K-DUR,KLOR-CON) 20 MEQ tablet Take 1 tablet (20 mEq total) by mouth daily. 01/29/16  Yes Dolores Pattyaniel R Bensimhon, MD  roflumilast (DALIRESP) 500 MCG TABS tablet First week take 1 tablet every other day then 1 tablet by mouth daily 02/02/16  Yes Kalman ShanMurali Ramaswamy, MD  Tiotropium Bromide Monohydrate (SPIRIVA RESPIMAT) 1.25 MCG/ACT AERS Inhale 2 puffs into the lungs 2 (two) times daily. 02/02/16  Yes Kalman ShanMurali Ramaswamy, MD  torsemide (DEMADEX) 20 MG tablet Take 1 tablet (20 mg total) by mouth 2 (two) times daily. 01/09/16  Yes Lonia BloodJeffrey T McClung, MD   BP 113/72 mmHg  Pulse 89  Temp(Src) 97.7 F (36.5 C) (Oral)  Resp 18  Ht 5' 2.5" (1.588 m)  Wt 87.045 kg  BMI 34.52 kg/m2  SpO2 93%  LMP 04/01/2016 Physical Exam  Constitutional: She is oriented to person, place, and time. No distress.  Chronically ill-appearing  HENT:  Head: Normocephalic and atraumatic.  Eyes: Pupils are equal, round, and reactive to light.  Neck: Normal range of motion. Neck supple. No JVD present.  Cardiovascular: Normal rate, regular rhythm and normal heart sounds.   Pulmonary/Chest: She has wheezes.   Tachypnea, prolonged expiration Diminished air movement  Abdominal: Soft. Bowel sounds are normal. She exhibits no distension. There is no tenderness.  Musculoskeletal: Normal range of motion. She exhibits no tenderness.  Neurological: She is alert and oriented to person, place, and time.  Skin: Skin is warm and dry.  Vitals reviewed.   ED Course  Procedures (including critical care time) Labs Review Labs Reviewed  BASIC METABOLIC PANEL - Abnormal; Notable for the following:    Chloride 96 (*)    Glucose, Bld 122 (*)    All other components within normal limits  CBC - Abnormal; Notable for the following:    Hemoglobin 15.1 (*)    HCT 46.3 (*)    All other components within normal limits  BASIC METABOLIC PANEL - Abnormal; Notable for the following:    Potassium 3.2 (*)    Chloride 95 (*)  Glucose, Bld 201 (*)    All other components within normal limits  CBC - Abnormal; Notable for the following:    MCV 100.2 (*)    Platelets 140 (*)    All other components within normal limits  D-DIMER, QUANTITATIVE (NOT AT [facility]) - Abnormal; Notable for the following:    D-Dimer, Quant 0.78 (*)    All other components within normal limits  POTASSIUM - Abnormal; Notable for the following:    Potassium 3.4 (*)    All other components within normal limits  I-STAT VENOUS BLOOD GAS, ED - Abnormal; Notable for the following:    pH, Ven 7.393 (*)    pCO2, Ven 57.6 (*)    pO2, Ven 52.0 (*)    Bicarbonate 35.1 (*)    Acid-Base Excess 8.0 (*)    All other components within normal limits  BRAIN NATRIURETIC PEPTIDE  MAGNESIUM  I-STAT TROPOININ, ED    Imaging Review Dg Chest 2 View  04/01/2016  CLINICAL DATA:  Shortness of breath for 3 days. EXAM: CHEST  2 VIEW COMPARISON:  01/03/2016. FINDINGS: Trachea is midline. Heart size stable. Probable mild linear scarring at the left lung base. No airspace consolidation or pleural fluid. IMPRESSION: No acute findings. Electronically Signed   By:  Leanna Battles M.D.   On: 04/01/2016 18:12   Ct Angio Chest Pe W/cm &/or Wo Cm  04/02/2016  CLINICAL DATA:  Worsening shortness of breath.  History of PE EXAM: CT ANGIOGRAPHY CHEST WITH CONTRAST TECHNIQUE: Multidetector CT imaging of the chest was performed using the standard protocol during bolus administration of intravenous contrast. Multiplanar CT image reconstructions and MIPs were obtained to evaluate the vascular anatomy. CONTRAST:  100 cc Isovue 370 intravenous COMPARISON:  12/16/2015 FINDINGS: THORACIC INLET/BODY WALL: No acute abnormality. MEDIASTINUM: Normal heart size. No pericardial effusion. Negative aorta. CTA of the pulmonary arteries is limited by respiratory motion, best obtainable due to patient condition. No evidence of pulmonary embolism. Hilar nodal enlargement which is likely reactive. Dilated main pulmonary artery at 34 mm. Patient has known pulmonary hypertension. LUNG WINDOWS: Diffuse airway thickening. Tracheomalacia with flattening. Patchy air trapping with overinflated right lower lobe, chronic. Mild patchy atelectasis. There is no edema, consolidation, effusion, or pneumothorax. Stable ground-glass nodule in the right upper lobe measuring 18 mm, without solid nodule. UPPER ABDOMEN: No acute findings.  Hepatic steatosis. OSSEOUS: No acute fracture.  No suspicious lytic or blastic lesions. Review of the MIP images confirms the above findings. IMPRESSION: 1. No evidence of acute pulmonary embolism. Study degraded by unavoidable respiratory motion. 2. COPD with bronchitis and air trapping. 3. Stable right upper lobe ground-glass nodule which requires yearly CT surveillance. Electronically Signed   By: Marnee Spring M.D.   On: 04/02/2016 05:32   I have personally reviewed and evaluated these images and lab results as part of my medical decision-making.   EKG Interpretation   Date/Time:  Thursday April 01 2016 17:46:21 EDT Ventricular Rate:  103 PR Interval:  162 QRS Duration:  76 QT Interval:  358 QTC Calculation: 468 R Axis:   95 Text Interpretation:  Sinus tachycardia Rightward axis Nonspecific T wave  abnormality No significant change since last tracing Confirmed by Denton Lank   MD, Caryn Bee ([id number]) on 04/01/2016 6:29:59 PM Also confirmed by Denton Lank  MD,  KEVIN ([id number]), editor [place]  CCT, BEVERLY ([id number])  on 04/02/2016  7:16:06 AM      MDM   Final diagnoses:  SOB (shortness of breath)  Respiratory Failure- acute,  hypoxic  Patient resents with COPD exacerbation. She has tight wheezes, poor air movement on exam. She was ambulated on pulse ox, and had desaturations into the 70s, with visible dyspnea. Chest x-ray shows no new infiltrates, she has no fever, doubt pneumonia. She was given IV steroids, several doses of albuterol and Atrovent.  While resting in bed, she has no trouble maintaining her oxygen saturations.  Discussed case with hospitalist, will admit to telemetry for observation, pulmonary toilet.  Erskine Emery, MD 04/03/16 1054  Cathren Laine, MD 04/05/16 [phone]

## 2016-04-01 NOTE — ED Notes (Signed)
Pt reports worsening SOB. Pt states that she normally wear 3L of O2. But has been on 4L with sats only in the 80's. Pt placed on 6L with sats at 93%.

## 2016-04-01 NOTE — ED Notes (Signed)
Pt ambulated to restroom with this RN with portable oxygen, while monitoring oxygen saturation level. While the pt was walking her oxygen saturation level dropped to 79%. When the pt sat back down her oxygen saturation level immediately increased up to 89% and then after about 1 minute the oxygen saturation level increased to 91%. Dr. Earlene Plateravis and Dr. Denton LankSteinl made aware.

## 2016-04-01 NOTE — H&P (Signed)
History and Physical    Mary [name] ZOX:[id number] DOB: 11/17/1976 DOA: 04/01/2016  Referring MD/NP/PA:Chris Earlene Plateravis PCP: Elvina SidleLAUENSTEIN,KURT, MD  Outpatient Specialists:  Dr. Marchelle Gearingamaswamy pulmonologist, Dr. Gala RomneyBensimhon Cardiology Patient coming from: Home  Chief Complaint: Shortness of breath and cough  HPI: Mary [name] is a 40 y.o. female with medical history significant of COPD oxygen dependent at 3 L, remote tobacco use, DVT/PE, PAH, and cor pulmonale; who presents with shortness of breath and productive cough. Symptoms started 2-3 days ago with increasing cough with clear sputum production. She notes progressively worsening shortness of breath with ambulation with dropping O2 sats into the 70s. She reports feeling subjectively hot as though she has had a fever but temperatures were noted not to be greater than 100. She otherwise had been doing well since her pulmonologist changed her medications in 12/2015 to Spiriva powder to inhaler, Breo, and added dailiresp. She doesn't know what started symptoms. Previous exacerbations are usually onset by the change in sputum color and burning chest pain,  however she denies having any of these symptoms during this time. Associated symptoms include some loose stools. Denies any chest pain, rash, bleeding, or orthopnea.   ED Course: On admission patient was evaluated and seen to be tachypneic, tachycardic, and with O2 sats level is 86%. BNP 19.4, troponin 0,   Review of Systems: As per HPI otherwise 10 point review of systems negative.    Past Medical History  Diagnosis Date  . COPD (chronic obstructive pulmonary disease) (HCC)     PFTS 03/08/12: fev1 0.58L/1%, FVC 1.18/33%, Ratop 49 and c/w ssevere obstruction. 21% BD response on FVC, RV 219%, DLCO 11/54%  . History of alcoholism (HCC)   . Irregular menses   . DVT of upper extremity (deep vein thrombosis) [facility](HCC) April 2013    right subclavian // Unclear precipitating cause - possibly significant  right heart failure, with vascular stasis potentially predisposing to clotting.    . Pulmonary embolus [facility](HCC) April 2013    Precipitating cause unclear. Was treated with coumadin from April-June 2013.  . Moderate to severe pulmonary hypertension [facility](HCC) April 2013    Cardiac cath on 03/06/12 - 1. Elevated pulmonary artery pressures, right sided filling pressures.,  2. PA: 64/45 (mean 53)    . Hepatic cirrhosis (HCC)     Questionable history of - Noted on CT abdomen (03/2012) - thought to be due to vascular congestion from right heart failure +/- patient's history of alcohol abuse  . Axillary adenopathy     right axillary adenopathy noted on CT chest (03/06/2012)  . Periodontal disease   . Chronic right-sided CHF (congestive heart failure) (HCC) 03/23/2012    with cor pulmonale. Last RHC 03/2012  . Pneumonia ~ 1985; 01/2011  . History of chronic bronchitis   . Exertional shortness of breath   . On home oxygen therapy     "2-3 L 24/7" (05/03/2013)  . OSA (obstructive sleep apnea)     wears noctural BiPAP (05/03/2013)  . Migraine     "~ 1/yr" (05/03/2013)    Past Surgical History  Procedure Laterality Date  . Cardiac surgery  10/16/1977    "my heart was backwards" (05/03/2013)  . Lung surgery  10/16/1977  . Appendectomy  05/03/2013  . Cardiac catheterization  03/2012  . Laparoscopic appendectomy N/A 05/03/2013    Procedure: APPENDECTOMY LAPAROSCOPIC;  Surgeon: Cherylynn RidgesJames O Wyatt, MD;  [facility];  Service: General;  Laterality: N/A;  . Right heart catheterization N/A 03/07/2012  Procedure: RIGHT HEART CATH;  Surgeon: Kathleene Hazel, MD;  Location: [facility] CATH LAB;  Service: Cardiovascular;  Laterality: N/A;  . Cardiac catheterization N/A 01/08/2016    Procedure: Right Heart Cath;  Surgeon: Dolores Patty, MD;  Location: [facility] INVASIVE CV LAB;  Service: Cardiovascular;  Laterality: N/A;     reports that she quit smoking about 4 years ago. Her smoking use included Cigarettes. She has a 25  pack-year smoking history. She has never used smokeless tobacco. She reports that she does not drink alcohol or use illicit drugs.  No Known Allergies  Family History  Problem Relation Age of Onset  . Hypertension Father   . Heart disease Father     CHF; died age 59   . Alcohol abuse Father   . Other Brother     died age 35 y.o overdose    Prior to Admission medications   Medication Sig Start Date End Date Taking? Authorizing Provider  albuterol (PROVENTIL HFA;VENTOLIN HFA) 108 (90 BASE) MCG/ACT inhaler Inhale 2 puffs into the lungs every 6 (six) hours as needed for wheezing or shortness of breath. Patient taking differently: Inhale 2 puffs into the lungs 4 (four) times daily.  09/06/14  Yes Kalman Shan, MD  fluticasone furoate-vilanterol (BREO ELLIPTA) 100-25 MCG/INH AEPB Inhale 1 puff into the lungs daily. 02/02/16  Yes Kalman Shan, MD  polyethylene glycol (MIRALAX / GLYCOLAX) packet Take 17 g by mouth daily as needed for mild constipation. 01/09/16  Yes Lonia Blood, MD  potassium chloride SA (K-DUR,KLOR-CON) 20 MEQ tablet Take 1 tablet (20 mEq total) by mouth daily. 01/29/16  Yes Dolores Patty, MD  roflumilast (DALIRESP) 500 MCG TABS tablet First week take 1 tablet every other day then 1 tablet by mouth daily 02/02/16  Yes Kalman Shan, MD  Tiotropium Bromide Monohydrate (SPIRIVA RESPIMAT) 1.25 MCG/ACT AERS Inhale 2 puffs into the lungs 2 (two) times daily. 02/02/16  Yes Kalman Shan, MD  torsemide (DEMADEX) 20 MG tablet Take 1 tablet (20 mg total) by mouth 2 (two) times daily. 01/09/16  Yes Lonia Blood, MD    Physical Exam: Filed Vitals:   04/01/16 2130 04/01/16 2145 04/01/16 2215 04/01/16 2230  BP: 121/74 112/77 133/72 132/72  Pulse: 99 98 119 99  Temp:      TempSrc:      Resp: Weight:      SpO2: 92% 94% 86% 95%      Constitutional: Moderate respiratory distress, unable to get comfortable Filed Vitals:   04/01/16 2130 04/01/16 2145  04/01/16 2215 04/01/16 2230  BP: 121/74 112/77 133/72 132/72  Pulse: 99 98 119 99  Temp:      TempSrc:      Resp: Weight:      SpO2: 92% 94% 86% 95%   Eyes: PERRL, lids and conjunctivae normal ENMT: Mucous membranes are moist. Posterior pharynx clear of any exudate or lesions.Normal dentition.  Neck: normal, supple, no masses, no thyromegaly Respiratory: Tachypneic, bilateral expiratory wheezes appreciated, patient done in shorten sinuses Cardiovascular: Tachycardic,  Abdomen: no tenderness, no masses palpated. No hepatosplenomegaly. Bowel sounds positive.  Musculoskeletal: no clubbing / cyanosis. No joint deformity upper and lower extremities. Good ROM, no contractures. Normal muscle tone.  Skin: no rashes, lesions, ulcers. No induration Neurologic: CN 2-12 grossly intact. Sensation intact, DTR normal. Strength 5/5 in all 4.  Psychiatric: Normal judgment and insight. Alert and oriented x 3. Normal mood.  Labs on Admission: I have personally reviewed following labs and imaging studies  CBC:  Recent Labs Lab 04/01/16 1750  WBC 7.1  HGB 15.1*  HCT 46.3*  MCV 100.0  PLT 155   Basic Metabolic Panel:  Recent Labs Lab 04/01/16 1750  NA 136  K 3.6  CL 96*  CO2 29  GLUCOSE 122*  BUN 6  CREATININE 0.63  CALCIUM 9.4   GFR: Estimated Creatinine Clearance: 98.3 mL/min (by C-G formula based on Cr of 0.63). Liver Function Tests: No results for input(s): AST, ALT, ALKPHOS, BILITOT, PROT, ALBUMIN in the last 168 hours. No results for input(s): LIPASE, AMYLASE in the last 168 hours. No results for input(s): AMMONIA in the last 168 hours. Coagulation Profile: No results for input(s): INR, PROTIME in the last 168 hours. Cardiac Enzymes: No results for input(s): CKTOTAL, CKMB, CKMBINDEX, TROPONINI in the last 168 hours. BNP (last 3 results) No results for input(s): PROBNP in the last 8760 hours. HbA1C: No results for input(s): HGBA1C in the last 72  hours. CBG: No results for input(s): GLUCAP in the last 168 hours. Lipid Profile: No results for input(s): CHOL, HDL, LDLCALC, TRIG, CHOLHDL, LDLDIRECT in the last 72 hours. Thyroid Function Tests: No results for input(s): TSH, T4TOTAL, FREET4, T3FREE, THYROIDAB in the last 72 hours. Anemia Panel: No results for input(s): VITAMINB12, FOLATE, FERRITIN, TIBC, IRON, RETICCTPCT in the last 72 hours. Urine analysis:    Component Value Date/Time   COLORURINE YELLOW 07/25/2015 1400   APPEARANCEUR HAZY* 07/25/2015 1400   LABSPEC 1.020 07/25/2015 1400   PHURINE 7.0 07/25/2015 1400   GLUCOSEU NEGATIVE 07/25/2015 1400   HGBUR LARGE* 07/25/2015 1400   BILIRUBINUR NEGATIVE 07/25/2015 1400   KETONESUR NEGATIVE 07/25/2015 1400   PROTEINUR NEGATIVE 07/25/2015 1400   UROBILINOGEN 1.0 07/25/2015 1400   NITRITE NEGATIVE 07/25/2015 1400   LEUKOCYTESUR NEGATIVE 07/25/2015 1400   Sepsis Labs: (procalcitonin:4,lacticidven:4) )No results found for this or any previous visit (from the past 240 hour(s)).   Radiological Exams on Admission: Dg Chest 2 View  04/01/2016  CLINICAL DATA:  Shortness of breath for 3 days. EXAM: CHEST  2 VIEW COMPARISON:  01/03/2016. FINDINGS: Trachea is midline. Heart size stable. Probable mild linear scarring at the left lung base. No airspace consolidation or pleural fluid. IMPRESSION: No acute findings. Electronically Signed   By: Leanna Battles M.D.   On: 04/01/2016 18:12    EKG: Independently reviewed. Sinus tachycardia with rightward axis with rate of 103  Assessment/Plan COPD exacerbation with chronic respiratory failure: Acute. Patient reports less frequent COPD exacerbations after changes in medication in 12/2015. Unclear cause of acute exacerbation. - Admit to a telemetry bed - Albuterol nebs q 4hrs and prn q 2 hrs - Solu-Medrol IV 60 mg every 8 hours - Continue Dailresp and Sprivia( studies show this is more effective even in acute exacerbations as  opposed to stopping and using ipratropium) - check d-dimer   Diastolic congestive heart failure: Stable. Patient does not appear to be in acute exacerbation. - Continue home torsemide and potassium  Sinus tachycardia - Continue to monitor  OSA on Bipap - Respiratory supine BiPAP at night   DVT prophylaxis: Lovenox Code Status: Full Family Communication: None  Disposition Plan: Possible discharge in 1-2 days Consults called: none Admission status: Telemetry observation   Clydie Braun MD [facility] Pager [phone]  If 7PM-7AM, please contact night-coverage [email or web address] Password [email or web address]  04/01/2016, 10:50 PM

## 2016-04-02 ENCOUNTER — Observation Stay (HOSPITAL_COMMUNITY): Payer: 59

## 2016-04-02 DIAGNOSIS — Z9981 Dependence on supplemental oxygen: Secondary | ICD-10-CM | POA: Diagnosis not present

## 2016-04-02 DIAGNOSIS — F1020 Alcohol dependence, uncomplicated: Secondary | ICD-10-CM | POA: Diagnosis present

## 2016-04-02 DIAGNOSIS — E1310 Other specified diabetes mellitus with ketoacidosis without coma: Secondary | ICD-10-CM | POA: Diagnosis not present

## 2016-04-02 DIAGNOSIS — J9622 Acute and chronic respiratory failure with hypercapnia: Secondary | ICD-10-CM | POA: Diagnosis present

## 2016-04-02 DIAGNOSIS — E1165 Type 2 diabetes mellitus with hyperglycemia: Secondary | ICD-10-CM | POA: Diagnosis present

## 2016-04-02 DIAGNOSIS — T380X5A Adverse effect of glucocorticoids and synthetic analogues, initial encounter: Secondary | ICD-10-CM | POA: Diagnosis present

## 2016-04-02 DIAGNOSIS — J9611 Chronic respiratory failure with hypoxia: Secondary | ICD-10-CM | POA: Diagnosis not present

## 2016-04-02 DIAGNOSIS — T17990A Other foreign object in respiratory tract, part unspecified in causing asphyxiation, initial encounter: Secondary | ICD-10-CM | POA: Diagnosis present

## 2016-04-02 DIAGNOSIS — Z86718 Personal history of other venous thrombosis and embolism: Secondary | ICD-10-CM | POA: Diagnosis not present

## 2016-04-02 DIAGNOSIS — I5032 Chronic diastolic (congestive) heart failure: Secondary | ICD-10-CM | POA: Diagnosis not present

## 2016-04-02 DIAGNOSIS — R0602 Shortness of breath: Secondary | ICD-10-CM | POA: Diagnosis present

## 2016-04-02 DIAGNOSIS — Z6834 Body mass index (BMI) 34.0-34.9, adult: Secondary | ICD-10-CM | POA: Diagnosis not present

## 2016-04-02 DIAGNOSIS — Z8249 Family history of ischemic heart disease and other diseases of the circulatory system: Secondary | ICD-10-CM | POA: Diagnosis not present

## 2016-04-02 DIAGNOSIS — Z79899 Other long term (current) drug therapy: Secondary | ICD-10-CM | POA: Diagnosis not present

## 2016-04-02 DIAGNOSIS — J441 Chronic obstructive pulmonary disease with (acute) exacerbation: Secondary | ICD-10-CM | POA: Diagnosis not present

## 2016-04-02 DIAGNOSIS — E876 Hypokalemia: Secondary | ICD-10-CM | POA: Diagnosis present

## 2016-04-02 DIAGNOSIS — G4733 Obstructive sleep apnea (adult) (pediatric): Secondary | ICD-10-CM | POA: Diagnosis present

## 2016-04-02 DIAGNOSIS — Z87891 Personal history of nicotine dependence: Secondary | ICD-10-CM | POA: Diagnosis not present

## 2016-04-02 DIAGNOSIS — I5033 Acute on chronic diastolic (congestive) heart failure: Secondary | ICD-10-CM | POA: Diagnosis present

## 2016-04-02 DIAGNOSIS — I2781 Cor pulmonale (chronic): Secondary | ICD-10-CM | POA: Diagnosis present

## 2016-04-02 DIAGNOSIS — I272 Other secondary pulmonary hypertension: Secondary | ICD-10-CM | POA: Diagnosis present

## 2016-04-02 DIAGNOSIS — J9621 Acute and chronic respiratory failure with hypoxia: Secondary | ICD-10-CM | POA: Diagnosis present

## 2016-04-02 DIAGNOSIS — J209 Acute bronchitis, unspecified: Secondary | ICD-10-CM | POA: Diagnosis present

## 2016-04-02 DIAGNOSIS — E119 Type 2 diabetes mellitus without complications: Secondary | ICD-10-CM | POA: Diagnosis not present

## 2016-04-02 DIAGNOSIS — J440 Chronic obstructive pulmonary disease with acute lower respiratory infection: Secondary | ICD-10-CM | POA: Diagnosis present

## 2016-04-02 DIAGNOSIS — K7460 Unspecified cirrhosis of liver: Secondary | ICD-10-CM | POA: Diagnosis present

## 2016-04-02 DIAGNOSIS — Z86711 Personal history of pulmonary embolism: Secondary | ICD-10-CM | POA: Diagnosis not present

## 2016-04-02 LAB — BASIC METABOLIC PANEL
Anion gap: 14 (ref 5–15)
BUN: 6 mg/dL (ref 6–20)
CO2: 28 mmol/L (ref 22–32)
CREATININE: 0.75 mg/dL (ref 0.44–1.00)
Calcium: 9.2 mg/dL (ref 8.9–10.3)
Chloride: 95 mmol/L — ABNORMAL LOW (ref 101–111)
GFR calc Af Amer: >60 mL/min (ref >60)
GFR calc non Af Amer: >60 mL/min (ref >60)
GLUCOSE: 201 mg/dL — AB (ref 65–99)
Potassium: 3.2 mmol/L — ABNORMAL LOW (ref 3.5–5.1)
Sodium: 137 mmol/L (ref 135–145)

## 2016-04-02 LAB — CBC
HCT: 44.5 % (ref 36.0–46.0)
Hemoglobin: 14.5 g/dL (ref 12.0–15.0)
MCH: 32.7 pg (ref 26.0–34.0)
MCHC: 32.6 g/dL (ref 30.0–36.0)
MCV: 100.2 fL — ABNORMAL HIGH (ref 78.0–100.0)
PLATELETS: 140 10*3/uL — AB (ref 150–400)
RBC: 4.44 MIL/uL (ref 3.87–5.11)
RDW: 13.1 % (ref 11.5–15.5)
WBC: 6.9 10*3/uL (ref 4.0–10.5)

## 2016-04-02 LAB — D-DIMER, QUANTITATIVE (NOT AT ARMC): D DIMER QUANT: 0.78 ug{FEU}/mL — AB (ref 0.00–0.50)

## 2016-04-02 MED ORDER — POTASSIUM CHLORIDE CRYS ER 20 MEQ PO TBCR
40.0000 meq | EXTENDED_RELEASE_TABLET | ORAL | Status: AC
  Administered 2016-04-02: 40 meq via ORAL
  Filled 2016-04-02: qty 2

## 2016-04-02 MED ORDER — TIOTROPIUM BROMIDE MONOHYDRATE 18 MCG IN CAPS
18.0000 ug | ORAL_CAPSULE | Freq: Every day | RESPIRATORY_TRACT | Status: DC
  Administered 2016-04-02 – 2016-04-04 (×3): 18 ug via RESPIRATORY_TRACT
  Filled 2016-04-02: qty 5

## 2016-04-02 MED ORDER — BUDESONIDE 0.5 MG/2ML IN SUSP
0.5000 mg | Freq: Two times a day (BID) | RESPIRATORY_TRACT | Status: DC
  Administered 2016-04-02 – 2016-04-04 (×4): 0.5 mg via RESPIRATORY_TRACT
  Filled 2016-04-02 (×4): qty 2

## 2016-04-02 MED ORDER — FUROSEMIDE 10 MG/ML IJ SOLN
60.0000 mg | Freq: Two times a day (BID) | INTRAMUSCULAR | Status: DC
  Administered 2016-04-02 – 2016-04-04 (×5): 60 mg via INTRAVENOUS
  Filled 2016-04-02 (×5): qty 6

## 2016-04-02 MED ORDER — POTASSIUM CHLORIDE CRYS ER 20 MEQ PO TBCR
40.0000 meq | EXTENDED_RELEASE_TABLET | Freq: Every day | ORAL | Status: DC
  Administered 2016-04-03 – 2016-04-11 (×9): 40 meq via ORAL
  Filled 2016-04-02 (×9): qty 2

## 2016-04-02 MED ORDER — ALBUTEROL SULFATE (2.5 MG/3ML) 0.083% IN NEBU
2.5000 mg | INHALATION_SOLUTION | Freq: Four times a day (QID) | RESPIRATORY_TRACT | Status: DC
  Administered 2016-04-02 – 2016-04-04 (×7): 2.5 mg via RESPIRATORY_TRACT
  Filled 2016-04-02 (×7): qty 3

## 2016-04-02 MED ORDER — IOPAMIDOL (ISOVUE-370) INJECTION 76%
INTRAVENOUS | Status: AC
Start: 2016-04-02 — End: 2016-04-02
  Administered 2016-04-02: 100 mL
  Filled 2016-04-02: qty 100

## 2016-04-02 MED ORDER — METOLAZONE 2.5 MG PO TABS
2.5000 mg | ORAL_TABLET | Freq: Once | ORAL | Status: AC
  Administered 2016-04-02: 2.5 mg via ORAL
  Filled 2016-04-02 (×2): qty 1

## 2016-04-02 MED ORDER — SODIUM CHLORIDE 0.9 % IV SOLN
INTRAVENOUS | Status: DC
  Administered 2016-04-02: 06:00:00 via INTRAVENOUS

## 2016-04-02 NOTE — Progress Notes (Signed)
Pt refusing bed alarm on, pt educated on pt's safety plan but still refused bed alarm. Will continue to do hourly rounding.

## 2016-04-02 NOTE — Progress Notes (Signed)
PROGRESS NOTE                                                                                                                                                                                                             Patient Demographics:    Mary [name], is a 40 y.o. female, DOB - 01-31-1976, FIE:[id number]  Admit date - 04/01/2016   Admitting Physician Clydie Braun, MD  Outpatient Primary MD for the patient is Elvina Sidle, MD  LOS -   Outpatient Specialists: Dr. Teressa Lower, [facility]  Chief Complaint  Patient presents with  . Shortness of Breath       Brief Narrative    Mary [name] is a 40 y.o. female with medical history significant of COPD oxygen dependent at 3 L, remote tobacco use, DVT/PE, PAH, and cor pulmonale; who presents with shortness of breath and productive cough. Symptoms started 2-3 days ago with increasing cough with clear sputum production. She notes progressively worsening shortness of breath with ambulation with dropping O2 sats into the 70s. She reports feeling subjectively hot as though she has had a fever but temperatures were noted not to be greater than 100. She otherwise had been doing well since her pulmonologist changed her medications in 12/2015 to Spiriva powder to inhaler, Breo, and added dailiresp. She doesn't know what started symptoms. Previous exacerbations are usually onset by the change in sputum color and burning chest pain, however she denies having any of these symptoms during this time. Associated symptoms include some loose stools. Denies any chest pain, rash, bleeding, or orthopnea.     Subjective:    Kameryn Davern today has, No headache, No chest pain, No abdominal pain - No Nausea, No new weakness tingling or numbness, No Cough - Improved shortness of breath and orthopnea.   Assessment  & Plan :      1.Acute hypoxic respiratory failure. This appears more  consistent with acute on chronic diastolic CHF EF 60% on recent echogram. She did miss a dose of her Demadex. Although BNP is stable she has rales on exam with trace edema and minimal elevation of JVD.  We'll place her on IV Lasix, one dose of Zaroxolyn, intake output, daily weight, salt and fluid restriction. Reevaluate tomorrow with ambulating pulse ox.  2. History of COPD. Question mild exacerbation. No wheezing at this time.  Oral steroids, continue her nebulizer treatments, evaluate for oxygen need.  3. Obstructive sleep apnea and morbid obesity. Follow-up with PCP for weight loss, uses BiPAP at home daily at bedtime, will be continued.  4. Hypokalemia. Replaced. We will monitor.    Code Status : Full  Family Communication  : None present  Disposition Plan  : Home 1-2 days  Barriers For Discharge : Shortness of breath  Consults  : None  Procedures  : CT angiogram of the chest -  nonacute  DVT Prophylaxis  :  Lovenox   Lab Results  Component Value Date   PLT 140* 04/02/2016    Antibiotics  :     Anti-infectives    None        Objective:   Filed Vitals:   04/02/16 0835 04/02/16 0915 04/02/16 0930 04/02/16 1015  BP: 129/99 112/96 111/81 122/78  Pulse: 95 96 95 95  Temp:    98 F (36.7 C)  TempSrc:    Oral  Resp: 19 20 19 24   Height:    5' 2.5" (1.588 m)  Weight:    89.812 kg (198 lb)  SpO2: 90% 91% 90% 87%    Wt Readings from Last 3 Encounters:  04/02/16 89.812 kg (198 lb)  01/22/16 91.853 kg (202 lb 8 oz)  01/21/16 90.81 kg (200 lb 3.2 oz)    No intake or output data in the 24 hours ending 04/02/16 1116   Physical Exam  Awake Alert, Oriented X 3, No new F.N deficits, Normal affect [place].AT,PERRAL Supple Neck,No JVD, No cervical lymphadenopathy appriciated.  Symmetrical Chest wall movement, Good air movement bilaterally, Few rales but no wheezing RRR,No Gallops,Rubs or new Murmurs, No Parasternal Heave +ve B.Sounds, Abd Soft, No tenderness, No  organomegaly appriciated, No rebound - guarding or rigidity. No Cyanosis, Clubbing, trace edema, No new Rash or bruise      Data Review:    CBC  Recent Labs Lab 04/01/16 1750 04/02/16 0207  WBC 7.1 6.9  HGB 15.1* 14.5  HCT 46.3* 44.5  PLT 155 140*  MCV 100.0 100.2*  MCH 32.6 32.7  MCHC 32.6 32.6  RDW 13.1 13.1    Chemistries   Recent Labs Lab 04/01/16 1750 04/02/16 0207  NA 136 137  K 3.6 3.2*  CL 96* 95*  CO2 29 28  GLUCOSE 122* 201*  BUN 6 6  CREATININE 0.63 0.75  CALCIUM 9.4 9.2   ------------------------------------------------------------------------------------------------------------------ No results for input(s): CHOL, HDL, LDLCALC, TRIG, CHOLHDL, LDLDIRECT in the last 72 hours.  Lab Results  Component Value Date   HGBA1C 5.6 11/14/2015   ------------------------------------------------------------------------------------------------------------------ No results for input(s): TSH, T4TOTAL, T3FREE, THYROIDAB in the last 72 hours.  Invalid input(s): FREET3 ------------------------------------------------------------------------------------------------------------------ No results for input(s): VITAMINB12, FOLATE, FERRITIN, TIBC, IRON, RETICCTPCT in the last 72 hours.  Coagulation profile No results for input(s): INR, PROTIME in the last 168 hours.   Recent Labs  04/01/16 0101  DDIMER 0.78*    Cardiac Enzymes No results for input(s): CKMB, TROPONINI, MYOGLOBIN in the last 168 hours.  Invalid input(s): CK ------------------------------------------------------------------------------------------------------------------    Component Value Date/Time   BNP 19.4 04/01/2016 1750    Inpatient Medications  Scheduled Meds: . albuterol  2.5 mg Nebulization Q4H  . arformoterol  15 mcg Nebulization BID  . budesonide (PULMICORT) nebulizer solution  0.5 mg Nebulization BID  . enoxaparin (LOVENOX) injection  40 mg Subcutaneous Q24H  . furosemide  60  mg Intravenous BID  . guaiFENesin  600 mg Oral  BID  . methylPREDNISolone (SOLU-MEDROL) injection  60 mg Intravenous Q8H  . potassium chloride SA  40 mEq Oral Daily  . roflumilast  500 mcg Oral Daily  . sodium chloride flush  3 mL Intravenous Q12H  . tiotropium  18 mcg Inhalation Daily   Continuous Infusions:  PRN Meds:.acetaminophen **OR** [DISCONTINUED] acetaminophen, albuterol, benzonatate, ondansetron **OR** ondansetron (ZOFRAN) IV, polyethylene glycol  Micro Results No results found for this or any previous visit (from the past 240 hour(s)).  Radiology Reports Dg Chest 2 View  04/01/2016  CLINICAL DATA:  Shortness of breath for 3 days. EXAM: CHEST  2 VIEW COMPARISON:  01/03/2016. FINDINGS: Trachea is midline. Heart size stable. Probable mild linear scarring at the left lung base. No airspace consolidation or pleural fluid. IMPRESSION: No acute findings. Electronically Signed   By: Leanna Battles M.D.   On: 04/01/2016 18:12   Ct Angio Chest Pe W/cm &/or Wo Cm  04/02/2016  CLINICAL DATA:  Worsening shortness of breath.  History of PE EXAM: CT ANGIOGRAPHY CHEST WITH CONTRAST TECHNIQUE: Multidetector CT imaging of the chest was performed using the standard protocol during bolus administration of intravenous contrast. Multiplanar CT image reconstructions and MIPs were obtained to evaluate the vascular anatomy. CONTRAST:  100 cc Isovue 370 intravenous COMPARISON:  12/16/2015 FINDINGS: THORACIC INLET/BODY WALL: No acute abnormality. MEDIASTINUM: Normal heart size. No pericardial effusion. Negative aorta. CTA of the pulmonary arteries is limited by respiratory motion, best obtainable due to patient condition. No evidence of pulmonary embolism. Hilar nodal enlargement which is likely reactive. Dilated main pulmonary artery at 34 mm. Patient has known pulmonary hypertension. LUNG WINDOWS: Diffuse airway thickening. Tracheomalacia with flattening. Patchy air trapping with overinflated right lower lobe,  chronic. Mild patchy atelectasis. There is no edema, consolidation, effusion, or pneumothorax. Stable ground-glass nodule in the right upper lobe measuring 18 mm, without solid nodule. UPPER ABDOMEN: No acute findings.  Hepatic steatosis. OSSEOUS: No acute fracture.  No suspicious lytic or blastic lesions. Review of the MIP images confirms the above findings. IMPRESSION: 1. No evidence of acute pulmonary embolism. Study degraded by unavoidable respiratory motion. 2. COPD with bronchitis and air trapping. 3. Stable right upper lobe ground-glass nodule which requires yearly CT surveillance. Electronically Signed   By: Marnee Spring M.D.   On: 04/02/2016 05:32    Time Spent in minutes  30   SINGH,PRASHANT K M.D on 04/02/2016 at 11:16 AM  Between 7am to 7pm - Pager - [phone]  After 7pm go to [email or web address] - password [facility]  [facility] -  Office  [phone]

## 2016-04-02 NOTE — Progress Notes (Signed)
RT NOTE:  BIPAP is setup @ bedside. Pt is able to put herself on when ready and calls RN to change over oxygen. RT available if needed.

## 2016-04-02 NOTE — Progress Notes (Signed)
04/02/16  1445  Walked patient in the hallway. Patient pulse ox 84% on 4L O2 while walking.

## 2016-04-02 NOTE — Evaluation (Signed)
Physical Therapy Evaluation Patient Details Name: Mary HakimMichelle L Bernath MRN: [id number] DOB: 04/16/1976 Today's Date: 04/02/2016   History of Present Illness  Patient is a 40 yo female admitted 04/01/16 with DOE, hypoxic resp failure.  Patient with COPD exacerbation, CHF.   PMH:  COPD on 3 liters O2 home, DVT/PE, PAH, cor pulmonale, CHF, obesity, OSA on BiPAP at night  Clinical Impression  Patient presents with problems listed below.  Will benefit from acute PT to maximize functional independence prior to return home.      Follow Up Recommendations No PT follow up;Supervision - Intermittent    Equipment Recommendations  None recommended by PT    Recommendations for Other Services       Precautions / Restrictions Precautions Precaution Comments: On O2 Restrictions Weight Bearing Restrictions: No      Mobility  Bed Mobility Overal bed mobility: Independent                Transfers Overall transfer level: Independent Equipment used: None                Ambulation/Gait Ambulation/Gait assistance: Independent Ambulation Distance (Feet): 160 Feet Assistive device: None Gait Pattern/deviations: Step-through pattern;Decreased stride length Gait velocity: decreased Gait velocity interpretation: Below normal speed for age/gender General Gait Details: Patient with slow steady gait.  Decreased speed to help minimize DOE.  During gait with O2 at 4 l/min, O2 sats decreased to 84/%, with HR up to 103.  Returned to sitting position - O2 sats returned to 88% with pursed-lip breathing.  Stairs            Wheelchair Mobility    Modified Rankin (Stroke Patients Only)       Balance Overall balance assessment: No apparent balance deficits (not formally assessed)                                           Pertinent Vitals/Pain Pain Assessment: No/denies pain    Home Living Family/patient expects to be discharged to:: Private residence Living  Arrangements: Spouse/significant other Available Help at Discharge: Family;Available PRN/intermittently Type of Home: House Home Access: Stairs to enter Entrance Stairs-Rails: Right Entrance Stairs-Number of Steps: 3 Home Layout: One level Home Equipment: None      Prior Function Level of Independence: Independent         Comments: Occasionally assist preparing dinner ("on bad days")     Hand Dominance        Extremity/Trunk Assessment   Upper Extremity Assessment: Overall WFL for tasks assessed           Lower Extremity Assessment: Overall WFL for tasks assessed         Communication   Communication: No difficulties  Cognition Arousal/Alertness: Awake/alert Behavior During Therapy: WFL for tasks assessed/performed Overall Cognitive Status: Within Functional Limits for tasks assessed                      General Comments      Exercises        Assessment/Plan    PT Assessment Patient needs continued PT services  PT Diagnosis Generalized weakness   PT Problem List Decreased activity tolerance;Decreased mobility;Cardiopulmonary status limiting activity;Obesity  PT Treatment Interventions Gait training;Functional mobility training;Therapeutic activities;Patient/family education   PT Goals (Current goals can be found in the Care Plan section) Acute Rehab PT Goals Patient Stated Goal: To  improve before going home PT Goal Formulation: With patient Time For Goal Achievement: 04/09/16 Potential to Achieve Goals: Good    Frequency Min 3X/week   Barriers to discharge Decreased caregiver support Does not have 24 hour assist.    Co-evaluation               End of Session Equipment Utilized During Treatment: Oxygen Activity Tolerance: Treatment limited secondary to medical complications (Comment) (Limited by decrease in O2 sats) Patient left: in bed;with call bell/phone within reach Nurse Communication: Mobility status         Time:  1610-9604 PT Time Calculation (min) (ACUTE ONLY): 27 min   Charges:   PT Evaluation $PT Eval Moderate Complexity: 1 Procedure PT Treatments $Gait Training: 8-22 mins   PT G Codes:        Vena Austria 2016-04-06, 5:20 PM Durenda Hurt. Renaldo Fiddler, [facility] Acute Rehab Services Pager [phone]

## 2016-04-02 NOTE — Care Management Note (Signed)
  Case Management Note  Patient Details  Name: Mary [name] MRN: [id number] Date of Birth: 01/29/1976  Subjective/Objective:                    Action/Plan:  PTA - independent from home with husband.  Pt confirmed that she does see the PCP listed on facesheet on a regular basis ; denies issues/concerns/hardship with obtaining medications.  PT is not [facility] appropriate.  Pt ambulating independently in the room and in the halls. NO CM needs determined at this time.   Expected Discharge Date:                  Expected Discharge Plan:     In-House Referral:     Discharge planning Services     Post Acute Care Choice:    Choice offered to:     DME Arranged:    DME Agency:     HH Arranged:    HH Agency:     Status of Service:     Medicare Important Message Given:    Date Medicare IM Given:    Medicare IM give by:    Date Additional Medicare IM Given:    Additional Medicare Important Message give by:     If discussed at Long Length of Stay Meetings, dates discussed:    Additional Comments:  Cherylann ParrClaxton, Derry Arbogast S, RN 04/02/2016, 1:39 PM

## 2016-04-02 NOTE — ED Notes (Signed)
Bedside commode placed at bedside.  

## 2016-04-02 NOTE — ED Notes (Signed)
ORDERED REG  BREAKFAST TRAY [id number]

## 2016-04-02 NOTE — ED Notes (Signed)
Message sent to pharmacy requesting lovenox be verified and sent to Pod B.

## 2016-04-02 NOTE — ED Notes (Signed)
Patient transported to CT 

## 2016-04-02 NOTE — ED Notes (Signed)
Respiratory at bedside.

## 2016-04-02 NOTE — ED Notes (Addendum)
Patient stated the room was hot; Patient provided with ice pack

## 2016-04-03 ENCOUNTER — Inpatient Hospital Stay (HOSPITAL_COMMUNITY): Payer: 59

## 2016-04-03 DIAGNOSIS — R0602 Shortness of breath: Secondary | ICD-10-CM

## 2016-04-03 LAB — MAGNESIUM: MAGNESIUM: 1.9 mg/dL (ref 1.7–2.4)

## 2016-04-03 LAB — POTASSIUM: POTASSIUM: 3.4 mmol/L — AB (ref 3.5–5.1)

## 2016-04-03 MED ORDER — METOLAZONE 2.5 MG PO TABS
2.5000 mg | ORAL_TABLET | Freq: Once | ORAL | Status: AC
  Administered 2016-04-03: 2.5 mg via ORAL
  Filled 2016-04-03: qty 1

## 2016-04-03 MED ORDER — POTASSIUM CHLORIDE CRYS ER 20 MEQ PO TBCR
40.0000 meq | EXTENDED_RELEASE_TABLET | Freq: Once | ORAL | Status: AC
  Administered 2016-04-03: 40 meq via ORAL
  Filled 2016-04-03: qty 2

## 2016-04-03 MED ORDER — MAGNESIUM SULFATE IN D5W 1-5 GM/100ML-% IV SOLN
1.0000 g | Freq: Once | INTRAVENOUS | Status: AC
  Administered 2016-04-03: 1 g via INTRAVENOUS
  Filled 2016-04-03: qty 100

## 2016-04-03 MED ORDER — AZITHROMYCIN 500 MG PO TABS
500.0000 mg | ORAL_TABLET | Freq: Every day | ORAL | Status: DC
  Administered 2016-04-03 – 2016-04-04 (×2): 500 mg via ORAL
  Filled 2016-04-03 (×2): qty 1

## 2016-04-03 NOTE — Progress Notes (Signed)
Patient refused bed alarm on, educated on patient safety plan but patient still refused bed alarm activated. Will continue hourly rounding.

## 2016-04-03 NOTE — Progress Notes (Signed)
VASCULAR LAB PRELIMINARY  PRELIMINARY  PRELIMINARY  PRELIMINARY  Bilateral lower extremity venous duplex completed.    Preliminary report:  There is no DVT or SVT noted in the bilateral lower extremities.   Denetria Luevanos, RVT 04/03/2016, 5:13 PM

## 2016-04-03 NOTE — Progress Notes (Signed)
Pt ambulated in hallway o n3.5L [place] Spo2 81% during ambulation. Pt stated she feels dizzy, SOB and jittery. Pt sitting on the edge of bed c/o HA

## 2016-04-03 NOTE — Progress Notes (Signed)
BIPAP is bedside and ready for use.  Patient stated she places on when ready for bed.

## 2016-04-03 NOTE — Progress Notes (Signed)
PROGRESS NOTE                                                                                                                                                                                                             Patient Demographics:    Mary [name], is a 40 y.o. female, DOB - 12/23/1975, EAV:[id number]  Admit date - 04/01/2016   Admitting Physician Clydie Braunondell A Smith, MD  Outpatient Primary MD for the patient is Elvina SidleLAUENSTEIN,KURT, MD  LOS - 1  Outpatient Specialists: Dr. Teressa LowerBensimohn, [facility]  Chief Complaint  Patient presents with  . Shortness of Breath       Brief Narrative    Mary [name] is a 40 y.o. female with medical history significant of COPD oxygen dependent at 3 L, remote tobacco use, DVT/PE, PAH, and cor pulmonale; who presents with shortness of breath and productive cough. Symptoms started 2-3 days ago with increasing cough with clear sputum production. She notes progressively worsening shortness of breath with ambulation with dropping O2 sats into the 70s. She reports feeling subjectively hot as though she has had a fever but temperatures were noted not to be greater than 100. She otherwise had been doing well since her pulmonologist changed her medications in 12/2015 to Spiriva powder to inhaler, Breo, and added dailiresp. She doesn't know what started symptoms. Previous exacerbations are usually onset by the change in sputum color and burning chest pain, however she denies having any of these symptoms during this time. Associated symptoms include some loose stools. Denies any chest pain, rash, bleeding, or orthopnea.     Subjective:    Mary [name] today has, No headache, No chest pain, No abdominal pain - No Nausea, No new weakness tingling or numbness, No Cough - Improved shortness of breath and orthopnea.   Assessment  & Plan :      1.Acute hypoxic respiratory failure. This appears more  consistent with acute on chronic diastolic CHF EF 60% on recent echogram. She did miss a dose of her Demadex. Although BNP is stable she has rales on exam with trace edema and minimal elevation of JVD.  We'll place her on IV Lasix, PO Zaroxolyn, intake output, daily weight, salt and fluid restriction. Feels better but still hypoxic on ambulation, continue present regimen. -ve 3 Lit.  Filed Weights   04/01/16 1751 04/02/16  1015 04/03/16 0626  Weight: 87.998 kg (194 lb) 89.812 kg (198 lb) 87.045 kg (191 lb 14.4 oz)     2. History of COPD with chronic hypoxic respiratory failure on 3 L nasal Oxygen at Home. Question mild exacerbation. No wheezing at this time. Oral steroids, continue her nebulizer treatments, evaluate for oxygen need. Follows with Dr. Marchelle Gearing.  3. Obstructive sleep apnea and morbid obesity. Follow-up with PCP for weight loss, uses BiPAP at home daily at bedtime, will be continued.  4. Hypokalemia. Replaced. We will monitor.  5. Mildly elevated d-dimer on admission. CT angiogram chest negative, check lower extremity venous ultrasound.   Code Status : Full  Family Communication  : None present  Disposition Plan  : Home 1-2 days  Barriers For Discharge : Shortness of breath  Consults  : None  Procedures  :   Lower extremity venous duplex   CT angiogram of the chest -  nonacute  DVT Prophylaxis  :  Lovenox   Lab Results  Component Value Date   PLT 140* 04/02/2016    Antibiotics  :     Anti-infectives    Start     Dose/Rate Route Frequency Ordered Stop   04/03/16 0900  azithromycin (ZITHROMAX) tablet 500 mg     500 mg Oral Daily 04/03/16 0845          Objective:   Filed Vitals:   04/03/16 0109 04/03/16 0626 04/03/16 0746 04/03/16 0823  BP:  122/77  113/72  Pulse: 101 91  89  Temp:  97.7 F (36.5 C)    TempSrc:  Oral    Resp: 19 18    Height:      Weight:  87.045 kg (191 lb 14.4 oz)    SpO2: 93% 92% 93%     Wt Readings from Last 3  Encounters:  04/03/16 87.045 kg (191 lb 14.4 oz)  01/22/16 91.853 kg (202 lb 8 oz)  01/21/16 90.81 kg (200 lb 3.2 oz)     Intake/Output Summary (Last 24 hours) at 04/03/16 1104 Last data filed at 04/03/16 1018  Gross per 24 hour  Intake   1320 ml  Output   4200 ml  Net  -2880 ml     Physical Exam  Awake Alert, Oriented X 3, No new F.N deficits, Normal affect [place].AT,PERRAL Supple Neck,No JVD, No cervical lymphadenopathy appriciated.  Symmetrical Chest wall movement, Good air movement bilaterally, Few rales but no wheezing, coarse B sounds RRR,No Gallops,Rubs or new Murmurs, No Parasternal Heave +ve B.Sounds, Abd Soft, No tenderness, No organomegaly appriciated, No rebound - guarding or rigidity. No Cyanosis, Clubbing, trace edema, No new Rash or bruise      Data Review:    CBC  Recent Labs Lab 04/01/16 1750 04/02/16 0207  WBC 7.1 6.9  HGB 15.1* 14.5  HCT 46.3* 44.5  PLT 155 140*  MCV 100.0 100.2*  MCH 32.6 32.7  MCHC 32.6 32.6  RDW 13.1 13.1    Chemistries   Recent Labs Lab 04/01/16 1750 04/02/16 0207 04/03/16 0454  NA 136 137  --   K 3.6 3.2* 3.4*  CL 96* 95*  --   CO2 29 28  --   GLUCOSE 122* 201*  --   BUN 6 6  --   CREATININE 0.63 0.75  --   CALCIUM 9.4 9.2  --   MG  --   --  1.9   ------------------------------------------------------------------------------------------------------------------ No results for input(s): CHOL, HDL, LDLCALC, TRIG, CHOLHDL, LDLDIRECT in the  last 72 hours.  Lab Results  Component Value Date   HGBA1C 5.6 11/14/2015   ------------------------------------------------------------------------------------------------------------------ No results for input(s): TSH, T4TOTAL, T3FREE, THYROIDAB in the last 72 hours.  Invalid input(s): FREET3 ------------------------------------------------------------------------------------------------------------------ No results for input(s): VITAMINB12, FOLATE, FERRITIN, TIBC, IRON,  RETICCTPCT in the last 72 hours.  Coagulation profile No results for input(s): INR, PROTIME in the last 168 hours.  Cardiac Enzymes No results for input(s): CKMB, TROPONINI, MYOGLOBIN in the last 168 hours.  Invalid input(s): CK ------------------------------------------------------------------------------------------------------------------    Component Value Date/Time   BNP 19.4 04/01/2016 1750    Inpatient Medications  Scheduled Meds: . albuterol  2.5 mg Nebulization Q6H  . arformoterol  15 mcg Nebulization BID  . azithromycin  500 mg Oral Daily  . budesonide (PULMICORT) nebulizer solution  0.5 mg Nebulization BID  . enoxaparin (LOVENOX) injection  40 mg Subcutaneous Q24H  . furosemide  60 mg Intravenous BID  . guaiFENesin  600 mg Oral BID  . magnesium sulfate 1 - 4 g bolus IVPB  1 g Intravenous Once  . methylPREDNISolone (SOLU-MEDROL) injection  60 mg Intravenous Q8H  . metolazone  2.5 mg Oral Once  . potassium chloride SA  40 mEq Oral Daily  . potassium chloride  40 mEq Oral Once  . roflumilast  500 mcg Oral Daily  . sodium chloride flush  3 mL Intravenous Q12H  . tiotropium  18 mcg Inhalation Daily   Continuous Infusions:  PRN Meds:.acetaminophen **OR** [DISCONTINUED] acetaminophen, albuterol, benzonatate, [DISCONTINUED] ondansetron **OR** ondansetron (ZOFRAN) IV, polyethylene glycol  Micro Results No results found for this or any previous visit (from the past 240 hour(s)).  Radiology Reports Dg Chest 2 View  04/01/2016  CLINICAL DATA:  Shortness of breath for 3 days. EXAM: CHEST  2 VIEW COMPARISON:  01/03/2016. FINDINGS: Trachea is midline. Heart size stable. Probable mild linear scarring at the left lung base. No airspace consolidation or pleural fluid. IMPRESSION: No acute findings. Electronically Signed   By: Leanna Battles M.D.   On: 04/01/2016 18:12   Ct Angio Chest Pe W/cm &/or Wo Cm  04/02/2016  CLINICAL DATA:  Worsening shortness of breath.  History of PE  EXAM: CT ANGIOGRAPHY CHEST WITH CONTRAST TECHNIQUE: Multidetector CT imaging of the chest was performed using the standard protocol during bolus administration of intravenous contrast. Multiplanar CT image reconstructions and MIPs were obtained to evaluate the vascular anatomy. CONTRAST:  100 cc Isovue 370 intravenous COMPARISON:  12/16/2015 FINDINGS: THORACIC INLET/BODY WALL: No acute abnormality. MEDIASTINUM: Normal heart size. No pericardial effusion. Negative aorta. CTA of the pulmonary arteries is limited by respiratory motion, best obtainable due to patient condition. No evidence of pulmonary embolism. Hilar nodal enlargement which is likely reactive. Dilated main pulmonary artery at 34 mm. Patient has known pulmonary hypertension. LUNG WINDOWS: Diffuse airway thickening. Tracheomalacia with flattening. Patchy air trapping with overinflated right lower lobe, chronic. Mild patchy atelectasis. There is no edema, consolidation, effusion, or pneumothorax. Stable ground-glass nodule in the right upper lobe measuring 18 mm, without solid nodule. UPPER ABDOMEN: No acute findings.  Hepatic steatosis. OSSEOUS: No acute fracture.  No suspicious lytic or blastic lesions. Review of the MIP images confirms the above findings. IMPRESSION: 1. No evidence of acute pulmonary embolism. Study degraded by unavoidable respiratory motion. 2. COPD with bronchitis and air trapping. 3. Stable right upper lobe ground-glass nodule which requires yearly CT surveillance. Electronically Signed   By: Marnee Spring M.D.   On: 04/02/2016 05:32    Time Spent in  minutes  30   Susa Raring K M.D on 04/03/2016 at 11:04 AM  Between 7am to 7pm - Pager - [phone]  After 7pm go to [email or web address] - password [facility]  [facility] -  Office  [phone]

## 2016-04-04 ENCOUNTER — Inpatient Hospital Stay (HOSPITAL_COMMUNITY): Payer: 59

## 2016-04-04 LAB — GLUCOSE, CAPILLARY
GLUCOSE-CAPILLARY: 288 mg/dL — AB (ref 65–99)
Glucose-Capillary: 478 mg/dL — ABNORMAL HIGH (ref 65–99)
Glucose-Capillary: 175 mg/dL — ABNORMAL HIGH (ref 65–99)

## 2016-04-04 LAB — BASIC METABOLIC PANEL
Anion gap: 17 — ABNORMAL HIGH (ref 5–15)
BUN: 32 mg/dL — AB (ref 6–20)
CALCIUM: 10.6 mg/dL — AB (ref 8.9–10.3)
CO2: 35 mmol/L — AB (ref 22–32)
CREATININE: 0.87 mg/dL (ref 0.44–1.00)
Chloride: 82 mmol/L — ABNORMAL LOW (ref 101–111)
GFR calc non Af Amer: >60 mL/min (ref >60)
Glucose, Bld: 399 mg/dL — ABNORMAL HIGH (ref 65–99)
Potassium: 4 mmol/L (ref 3.5–5.1)
Sodium: 134 mmol/L — ABNORMAL LOW (ref 135–145)

## 2016-04-04 MED ORDER — BUDESONIDE 0.25 MG/2ML IN SUSP: 0.2500 mg | Freq: Two times a day (BID) | RESPIRATORY_TRACT | Status: DC

## 2016-04-04 MED ORDER — INSULIN ASPART 100 UNIT/ML ~~LOC~~ SOLN: 0.0000 [IU] | Freq: Every day | SUBCUTANEOUS | Status: DC

## 2016-04-04 MED ORDER — INSULIN ASPART 100 UNIT/ML ~~LOC~~ SOLN
0.0000 [IU] | Freq: Every day | SUBCUTANEOUS | Status: DC
Start: 2016-04-04 — End: 2016-04-09
  Administered 2016-04-05 – 2016-04-06 (×2): 5 [IU] via SUBCUTANEOUS
  Administered 2016-04-07: 3 [IU] via SUBCUTANEOUS
  Administered 2016-04-08: 5 [IU] via SUBCUTANEOUS

## 2016-04-04 MED ORDER — INSULIN ASPART 100 UNIT/ML ~~LOC~~ SOLN: 0.0000 [IU] | Freq: Three times a day (TID) | SUBCUTANEOUS | Status: DC

## 2016-04-04 MED ORDER — INSULIN ASPART 100 UNIT/ML ~~LOC~~ SOLN
0.0000 [IU] | Freq: Three times a day (TID) | SUBCUTANEOUS | Status: DC
  Administered 2016-04-04 – 2016-04-05 (×2): 8 [IU] via SUBCUTANEOUS
  Administered 2016-04-05: 15 [IU] via SUBCUTANEOUS
  Administered 2016-04-05: 11 [IU] via SUBCUTANEOUS
  Administered 2016-04-06 (×2): 15 [IU] via SUBCUTANEOUS

## 2016-04-04 MED ORDER — IPRATROPIUM-ALBUTEROL 0.5-2.5 (3) MG/3ML IN SOLN
3.0000 mL | Freq: Four times a day (QID) | RESPIRATORY_TRACT | Status: DC
  Administered 2016-04-04 – 2016-04-05 (×4): 3 mL via RESPIRATORY_TRACT
  Filled 2016-04-04 (×4): qty 3

## 2016-04-04 MED ORDER — INSULIN ASPART 100 UNIT/ML ~~LOC~~ SOLN
20.0000 [IU] | Freq: Once | SUBCUTANEOUS | Status: AC
  Administered 2016-04-04: 20 [IU] via SUBCUTANEOUS

## 2016-04-04 MED ORDER — METHYLPREDNISOLONE SODIUM SUCC 40 MG IJ SOLR
30.0000 mg | Freq: Once | INTRAMUSCULAR | Status: AC
  Administered 2016-04-04: 30 mg via INTRAVENOUS
  Filled 2016-04-04: qty 1

## 2016-04-04 MED ORDER — METHYLPREDNISOLONE SODIUM SUCC 125 MG IJ SOLR
60.0000 mg | Freq: Two times a day (BID) | INTRAMUSCULAR | Status: DC
  Administered 2016-04-05: 60 mg via INTRAVENOUS
  Filled 2016-04-04: qty 2

## 2016-04-04 MED ORDER — MOMETASONE FURO-FORMOTEROL FUM 200-5 MCG/ACT IN AERO
2.0000 | INHALATION_SPRAY | Freq: Two times a day (BID) | RESPIRATORY_TRACT | Status: DC
  Administered 2016-04-04 – 2016-04-05 (×2): 2 via RESPIRATORY_TRACT
  Filled 2016-04-04: qty 8.8

## 2016-04-04 MED ORDER — BUDESONIDE 0.5 MG/2ML IN SUSP
0.5000 mg | Freq: Once | RESPIRATORY_TRACT | Status: AC
  Administered 2016-04-04: 0.5 mg via RESPIRATORY_TRACT

## 2016-04-04 MED ORDER — FUROSEMIDE 10 MG/ML IJ SOLN
40.0000 mg | Freq: Once | INTRAMUSCULAR | Status: AC
  Administered 2016-04-04: 40 mg via INTRAVENOUS
  Filled 2016-04-04: qty 4

## 2016-04-04 MED ORDER — METOLAZONE 2.5 MG PO TABS
2.5000 mg | ORAL_TABLET | Freq: Once | ORAL | Status: AC
  Administered 2016-04-04: 2.5 mg via ORAL
  Filled 2016-04-04: qty 1

## 2016-04-04 NOTE — Significant Event (Signed)
Called by RT that pt was unable to catch her breath, given duoneb and dulera,  with sats dropping to 84% on 4l [place].  Marland Kitchen.  Upon my arrival pt sitting on side of bed in  mod respiratory distress, with  increased WOB and use of abdominal muscles.   RR 28, Expiratory wheezing throughout.   able to speak only a few words at a time. Non productive rattling cough.   Alert, oriented.   VS:  122/85  110 ST  98.0 temp orally.    Placed on NRB mask 15 l  with sats  improved to 95%, PCXR resulted in no acute disease,   ABG:  7.34pCO2 83  PO2 371  Bicarb 43.7   Pt was placed on home bipap.   2025 Lenny Pastelom Callahan at bedside and ordered   Pt to receive an additional dose of Solumedrol tonight.  Hand off report given to OaklynHui, RN  2100 F/u  Breathing easier but remains a bit dyspneic on home bipap  O2 sats 95%.  RR 22. Bilateral BS clear, diminished in bases.   Will continue to follow as needed

## 2016-04-04 NOTE — Progress Notes (Addendum)
PROGRESS NOTE                                                                                                                                                                                                             Patient Demographics:    Mary [name], is a 40 y.o. female, DOB - 29-May-1976, RUE:[id number]  Admit date - 04/01/2016   Admitting Physician Clydie Braun, MD  Outpatient Primary MD for the patient is Elvina Sidle, MD  LOS - 2  Outpatient Specialists: Dr. Teressa Lower, [facility]  Chief Complaint  Patient presents with  . Shortness of Breath       Brief Narrative    Mary [name] is a 40 y.o. female with medical history significant of COPD oxygen dependent at 3 L, remote tobacco use, DVT/PE, PAH, and cor pulmonale; who presents with shortness of breath and productive cough. Symptoms started 2-3 days ago with increasing cough with clear sputum production.   Further workup was consistent with COPD exacerbation/bronchitis along with acute on chronic diastolic CHF. She's been treated for both with IV steroids, antibiotics and IV Lasix along with Zaroxolyn as needed with good affect. Likely 1-2 days more of hospital stay.     Subjective:    Mary [name] today has, No headache, No chest pain, No abdominal pain - No Nausea, No new weakness tingling or numbness, ++ Cough - Improved shortness of breath and orthopnea.   Assessment  & Plan :      1.Acute hypoxic respiratory failure. This appears more consistent with acute on chronic diastolic CHF EF 60% on recent echogram + COPD exacerbation/bronchitis. She did miss a dose of her Demadex day before admission. Although BNP is stable she has rales on exam with trace edema and minimal elevation of JVD.  She has improved on IV Lasix, PO Zaroxolyn, monitor intake output, daily weights, continue salt and fluid restriction. Feels better but still hypoxic on  ambulation, continue present regimen. -ve 4 Lit. BUN has bumped will hold off further Lasix after am dose and monitor.  Filed Weights   04/01/16 1751 04/02/16 1015 04/03/16 0626  Weight: 87.998 kg (194 lb) 89.812 kg (198 lb) 87.045 kg (191 lb 14.4 oz)     2. History of COPD with chronic hypoxic respiratory failure on 3 L nasal Oxygen at Home with exacerbation. Minimal wheezing  at this time. Continue IV steroids - azithromycin, continue her nebulizer treatments, o2. +++ coarse B sounds hence added flutter valve, repeat CXR in am, she follows with Dr. Marchelle Gearingamaswamy.  3. Obstructive sleep apnea and morbid obesity. Follow-up with PCP for weight loss, uses BiPAP at home daily at bedtime, will be continued.  4. Hypokalemia. Replaced. We will monitor.  5. Mildly elevated d-dimer on admission. CT angiogram chest negative, negative lower extremity venous ultrasound.  6.Hyperglycemia. Most likely steroid-induced, check A1c, sliding scale for now.   Code Status : Full  Family Communication  : None present  Disposition Plan  : Home 1-2 days  Barriers For Discharge : Shortness of breath  Consults  : None  Procedures  :   Lower extremity venous duplex - negative  CT angiogram of the chest -  nonacute  DVT Prophylaxis  :  Lovenox   Lab Results  Component Value Date   PLT 140* 04/02/2016    Antibiotics  :     Anti-infectives    Start     Dose/Rate Route Frequency Ordered Stop   04/03/16 0900  azithromycin (ZITHROMAX) tablet 500 mg     500 mg Oral Daily 04/03/16 0845          Objective:   Filed Vitals:   04/04/16 0036 04/04/16 0149 04/04/16 0517 04/04/16 0749  BP: 102/58     Pulse: 91 90    Temp: 98 F (36.7 C)  98 F (36.7 C)   TempSrc: Oral     Resp: 16 18    Height:      Weight:      SpO2: 92% 92%  93%    Wt Readings from Last 3 Encounters:  04/03/16 87.045 kg (191 lb 14.4 oz)  01/22/16 91.853 kg (202 lb 8 oz)  01/21/16 90.81 kg (200 lb 3.2 oz)      Intake/Output Summary (Last 24 hours) at 04/04/16 1044 Last data filed at 04/04/16 0816  Gross per 24 hour  Intake   1440 ml  Output   2350 ml  Net   -910 ml     Physical Exam  Awake Alert, Oriented X 3, No new F.N deficits, Normal affect [place].AT,PERRAL Supple Neck,No JVD, No cervical lymphadenopathy appriciated.  Symmetrical Chest wall movement, Good air movement bilaterally, Few rales but no wheezing, coarse B sounds RRR,No Gallops,Rubs or new Murmurs, No Parasternal Heave +ve B.Sounds, Abd Soft, No tenderness, No organomegaly appriciated, No rebound - guarding or rigidity. No Cyanosis, Clubbing, trace edema, No new Rash or bruise      Data Review:    CBC  Recent Labs Lab 04/01/16 1750 04/02/16 0207  WBC 7.1 6.9  HGB 15.1* 14.5  HCT 46.3* 44.5  PLT 155 140*  MCV 100.0 100.2*  MCH 32.6 32.7  MCHC 32.6 32.6  RDW 13.1 13.1    Chemistries   Recent Labs Lab 04/01/16 1750 04/02/16 0207 04/03/16 0454 04/04/16 0300  NA 136 137  --  134*  K 3.6 3.2* 3.4* 4.0  CL 96* 95*  --  82*  CO2 29 28  --  35*  GLUCOSE 122* 201*  --  399*  BUN 6 6  --  32*  CREATININE 0.63 0.75  --  0.87  CALCIUM 9.4 9.2  --  10.6*  MG  --   --  1.9  --    ------------------------------------------------------------------------------------------------------------------ No results for input(s): CHOL, HDL, LDLCALC, TRIG, CHOLHDL, LDLDIRECT in the last 72 hours.  Lab Results  Component Value Date   HGBA1C 5.6 11/14/2015   ------------------------------------------------------------------------------------------------------------------ No results for input(s): TSH, T4TOTAL, T3FREE, THYROIDAB in the last 72 hours.  Invalid input(s): FREET3 ------------------------------------------------------------------------------------------------------------------ No results for input(s): VITAMINB12, FOLATE, FERRITIN, TIBC, IRON, RETICCTPCT in the last 72 hours.  Coagulation profile No  results for input(s): INR, PROTIME in the last 168 hours.  Cardiac Enzymes No results for input(s): CKMB, TROPONINI, MYOGLOBIN in the last 168 hours.  Invalid input(s): CK ------------------------------------------------------------------------------------------------------------------    Component Value Date/Time   BNP 19.4 04/01/2016 1750    Inpatient Medications  Scheduled Meds: . azithromycin  500 mg Oral Daily  . budesonide (PULMICORT) nebulizer solution  0.5 mg Nebulization BID  . enoxaparin (LOVENOX) injection  40 mg Subcutaneous Q24H  . furosemide  40 mg Intravenous Once  . furosemide  60 mg Intravenous BID  . guaiFENesin  600 mg Oral BID  . insulin aspart  0-5 Units Subcutaneous QHS  . insulin aspart  0-9 Units Subcutaneous TID WC  . ipratropium-albuterol  3 mL Nebulization Q6H  . methylPREDNISolone (SOLU-MEDROL) injection  60 mg Intravenous Q8H  . metolazone  2.5 mg Oral Once  . mometasone-formoterol  2 puff Inhalation BID  . potassium chloride SA  40 mEq Oral Daily  . roflumilast  500 mcg Oral Daily   Continuous Infusions:  PRN Meds:.acetaminophen **OR** [DISCONTINUED] acetaminophen, albuterol, benzonatate, [DISCONTINUED] ondansetron **OR** ondansetron (ZOFRAN) IV, polyethylene glycol  Micro Results No results found for this or any previous visit (from the past 240 hour(s)).  Radiology Reports Dg Chest 2 View  04/01/2016  CLINICAL DATA:  Shortness of breath for 3 days. EXAM: CHEST  2 VIEW COMPARISON:  01/03/2016. FINDINGS: Trachea is midline. Heart size stable. Probable mild linear scarring at the left lung base. No airspace consolidation or pleural fluid. IMPRESSION: No acute findings. Electronically Signed   By: Leanna Battles M.D.   On: 04/01/2016 18:12   Ct Angio Chest Pe W/cm &/or Wo Cm  04/02/2016  CLINICAL DATA:  Worsening shortness of breath.  History of PE EXAM: CT ANGIOGRAPHY CHEST WITH CONTRAST TECHNIQUE: Multidetector CT imaging of the chest was  performed using the standard protocol during bolus administration of intravenous contrast. Multiplanar CT image reconstructions and MIPs were obtained to evaluate the vascular anatomy. CONTRAST:  100 cc Isovue 370 intravenous COMPARISON:  12/16/2015 FINDINGS: THORACIC INLET/BODY WALL: No acute abnormality. MEDIASTINUM: Normal heart size. No pericardial effusion. Negative aorta. CTA of the pulmonary arteries is limited by respiratory motion, best obtainable due to patient condition. No evidence of pulmonary embolism. Hilar nodal enlargement which is likely reactive. Dilated main pulmonary artery at 34 mm. Patient has known pulmonary hypertension. LUNG WINDOWS: Diffuse airway thickening. Tracheomalacia with flattening. Patchy air trapping with overinflated right lower lobe, chronic. Mild patchy atelectasis. There is no edema, consolidation, effusion, or pneumothorax. Stable ground-glass nodule in the right upper lobe measuring 18 mm, without solid nodule. UPPER ABDOMEN: No acute findings.  Hepatic steatosis. OSSEOUS: No acute fracture.  No suspicious lytic or blastic lesions. Review of the MIP images confirms the above findings. IMPRESSION: 1. No evidence of acute pulmonary embolism. Study degraded by unavoidable respiratory motion. 2. COPD with bronchitis and air trapping. 3. Stable right upper lobe ground-glass nodule which requires yearly CT surveillance. Electronically Signed   By: Marnee Spring M.D.   On: 04/02/2016 05:32    Time Spent in minutes  30   Shabree Tebbetts K M.D on 04/04/2016 at 10:44 AM  Between 7am to 7pm - Pager - [phone]  After 7pm go to [email or web address] - password [facility]  [facility] -  Office  [phone]

## 2016-04-04 NOTE — Progress Notes (Signed)
[facility] progress note. Chief complaint. Dyspnea, desats. History of present illness. This 40 year old female in hospital with acute hypoxic respiratory failure probably multifactorial due to congestive heart failure and baseline COPD. The patient complained to nursing of increased dyspnea and patient was noted to have decreased O2 sats in the 80s on 6 L nasal cannula oxygen. Patient was placed on nonrebreather mask with O2 sats improving to the mid 90s. Patient remained dyspneic and rapid response came to the bedside. A chest x-ray was obtained and this found no evidence of acute disease. An arterial blood gas was obtained and showed pH 7.342, PCO2 pending, PO2 high 371, bicarbonate by 43.7. I came to the bedside to further evaluate the patient as well. Patient was able to speak in full sentences no does demonstrate a degree of tachypnea and some use of accessory respiratory muscles. Physical exam. Vital signs. Temperature 90.8, pulse 109, respiration 28, blood pressure 122/85. O2 sats 95%.  General appearance. Obese female who is alert with mild increase work of breathing. No evidence of distress. Patient able to speak in full sentences without difficulty. Cardiac. Mildly tachycardic and regular. No jugular venous distention or significant edema. Lungs. Breath sounds are reduced in the bases with upper airway wheezing appreciated. Mild increase work of breathing and some use of accessory respiratory muscles. Abdomen. Soft and obese with positive bowel sounds. No pain. Impression/plan. Problem #1. Acute hypoxic respiratory failure. No evidence of CHF per exam or per chest x-ray. Some upper airway wheezing and wheezing in the bases appreciated. We'll give the patient an extra 30 mg IV Solu-Medrol as a one-time dose. Patient has placed herself on home BiPAP and appears to be breathing comfortably now. We'll follow for any further changes.

## 2016-04-04 NOTE — Progress Notes (Signed)
RN staopped RT to give [name] a tx before seeing any other pt.  Pt stas were 88% on 4lpm [place] and after tx dropped to 84%.  Pt couging with a wet and junky cough and diminished and wheeze in bases. Within several mins pt became SOB and required NRB of 100% o2.  Rt called rapid response and ABG ordered.  Results pending per ABG.

## 2016-04-04 NOTE — Progress Notes (Signed)
Pt places self on/off cpap.  Rt will monitor. 

## 2016-04-05 ENCOUNTER — Inpatient Hospital Stay (HOSPITAL_COMMUNITY): Payer: 59

## 2016-04-05 DIAGNOSIS — J441 Chronic obstructive pulmonary disease with (acute) exacerbation: Secondary | ICD-10-CM

## 2016-04-05 DIAGNOSIS — J9611 Chronic respiratory failure with hypoxia: Secondary | ICD-10-CM

## 2016-04-05 LAB — BLOOD GAS, ARTERIAL
ACID-BASE EXCESS: 17.2 mmol/L — AB (ref 0.0–2.0)
BICARBONATE: 43.7 meq/L — AB (ref 20.0–24.0)
Drawn by: 236041
FIO2: 1
O2 Saturation: 99.8 %
PATIENT TEMPERATURE: 98.6
PCO2 ART: 83 mmHg — AB (ref 35.0–45.0)
PO2 ART: 371 mmHg — AB (ref 80.0–100.0)
TCO2: 46.3 mmol/L (ref 0–100)
pH, Arterial: 7.342 — ABNORMAL LOW (ref 7.350–7.450)

## 2016-04-05 LAB — CBC
HEMATOCRIT: 50.6 % — AB (ref 36.0–46.0)
Hemoglobin: 16.4 g/dL — ABNORMAL HIGH (ref 12.0–15.0)
MCH: 33.1 pg (ref 26.0–34.0)
MCHC: 32.4 g/dL (ref 30.0–36.0)
MCV: 102.2 fL — ABNORMAL HIGH (ref 78.0–100.0)
PLATELETS: 190 10*3/uL (ref 150–400)
RBC: 4.95 MIL/uL (ref 3.87–5.11)
RDW: 12.9 % (ref 11.5–15.5)
WBC: 17.3 10*3/uL — AB (ref 4.0–10.5)

## 2016-04-05 LAB — BASIC METABOLIC PANEL
ANION GAP: 14 (ref 5–15)
BUN: 43 mg/dL — AB (ref 6–20)
CALCIUM: 9.9 mg/dL (ref 8.9–10.3)
CO2: 40 mmol/L — ABNORMAL HIGH (ref 22–32)
Chloride: 81 mmol/L — ABNORMAL LOW (ref 101–111)
Creatinine, Ser: 0.98 mg/dL (ref 0.44–1.00)
GFR calc Af Amer: >60 mL/min (ref >60)
Glucose, Bld: 308 mg/dL — ABNORMAL HIGH (ref 65–99)
POTASSIUM: 4.4 mmol/L (ref 3.5–5.1)
SODIUM: 135 mmol/L (ref 135–145)

## 2016-04-05 LAB — GLUCOSE, CAPILLARY
GLUCOSE-CAPILLARY: 269 mg/dL — AB (ref 65–99)
GLUCOSE-CAPILLARY: 368 mg/dL — AB (ref 65–99)
GLUCOSE-CAPILLARY: 346 mg/dL — AB (ref 65–99)
Glucose-Capillary: 458 mg/dL — ABNORMAL HIGH (ref 65–99)

## 2016-04-05 LAB — HEMOGLOBIN A1C
HEMOGLOBIN A1C: 5.2 % (ref 4.8–5.6)
Mean Plasma Glucose: 103 mg/dL

## 2016-04-05 MED ORDER — PANTOPRAZOLE SODIUM 40 MG PO TBEC
40.0000 mg | DELAYED_RELEASE_TABLET | Freq: Two times a day (BID) | ORAL | Status: DC
  Administered 2016-04-05 – 2016-04-10 (×12): 40 mg via ORAL
  Filled 2016-04-05 (×12): qty 1

## 2016-04-05 MED ORDER — METHYLPREDNISOLONE SODIUM SUCC 125 MG IJ SOLR
60.0000 mg | Freq: Four times a day (QID) | INTRAMUSCULAR | Status: DC
  Administered 2016-04-05 – 2016-04-06 (×5): 60 mg via INTRAVENOUS
  Filled 2016-04-05 (×5): qty 2

## 2016-04-05 MED ORDER — BUDESONIDE 0.5 MG/2ML IN SUSP
0.5000 mg | Freq: Two times a day (BID) | RESPIRATORY_TRACT | Status: DC
  Administered 2016-04-05 – 2016-04-11 (×12): 0.5 mg via RESPIRATORY_TRACT
  Filled 2016-04-05 (×12): qty 2

## 2016-04-05 MED ORDER — INSULIN ASPART 100 UNIT/ML ~~LOC~~ SOLN: 0.0000 [IU] | Freq: Three times a day (TID) | SUBCUTANEOUS | Status: DC

## 2016-04-05 MED ORDER — SODIUM CHLORIDE 3 % IN NEBU
5.0000 mL | INHALATION_SOLUTION | Freq: Two times a day (BID) | RESPIRATORY_TRACT | Status: DC
  Administered 2016-04-05 – 2016-04-06 (×2): 5 mL via RESPIRATORY_TRACT
  Administered 2016-04-06 – 2016-04-07 (×3): 4 mL via RESPIRATORY_TRACT
  Administered 2016-04-08: 5 mL via RESPIRATORY_TRACT
  Administered 2016-04-08: 4 mL via RESPIRATORY_TRACT
  Administered 2016-04-09: 5 mL via RESPIRATORY_TRACT
  Administered 2016-04-09: 4 mL via RESPIRATORY_TRACT
  Filled 2016-04-05 (×13): qty 15

## 2016-04-05 MED ORDER — ARFORMOTEROL TARTRATE 15 MCG/2ML IN NEBU
15.0000 ug | INHALATION_SOLUTION | Freq: Two times a day (BID) | RESPIRATORY_TRACT | Status: DC
  Administered 2016-04-05 – 2016-04-11 (×13): 15 ug via RESPIRATORY_TRACT
  Filled 2016-04-05 (×13): qty 2

## 2016-04-05 MED ORDER — IPRATROPIUM-ALBUTEROL 0.5-2.5 (3) MG/3ML IN SOLN
3.0000 mL | RESPIRATORY_TRACT | Status: DC
  Administered 2016-04-05 – 2016-04-11 (×36): 3 mL via RESPIRATORY_TRACT
  Filled 2016-04-05 (×37): qty 3

## 2016-04-05 MED ORDER — BENZONATATE 100 MG PO CAPS
200.0000 mg | ORAL_CAPSULE | Freq: Three times a day (TID) | ORAL | Status: DC
  Administered 2016-04-05 – 2016-04-11 (×19): 200 mg via ORAL
  Filled 2016-04-05 (×19): qty 2

## 2016-04-05 MED ORDER — INSULIN ASPART 100 UNIT/ML ~~LOC~~ SOLN
3.0000 [IU] | Freq: Three times a day (TID) | SUBCUTANEOUS | Status: DC
  Administered 2016-04-05 – 2016-04-06 (×3): 3 [IU] via SUBCUTANEOUS

## 2016-04-05 MED ORDER — TORSEMIDE 20 MG PO TABS
20.0000 mg | ORAL_TABLET | Freq: Every day | ORAL | Status: DC
  Administered 2016-04-05 – 2016-04-11 (×7): 20 mg via ORAL
  Filled 2016-04-05 (×7): qty 1

## 2016-04-05 MED ORDER — LEVOFLOXACIN IN D5W 500 MG/100ML IV SOLN
500.0000 mg | INTRAVENOUS | Status: DC
  Administered 2016-04-05 – 2016-04-06 (×2): 500 mg via INTRAVENOUS
  Filled 2016-04-05 (×2): qty 100

## 2016-04-05 MED ORDER — INSULIN ASPART 100 UNIT/ML ~~LOC~~ SOLN: 0.0000 [IU] | Freq: Every day | SUBCUTANEOUS | Status: DC

## 2016-04-05 MED ORDER — INSULIN GLARGINE 100 UNIT/ML ~~LOC~~ SOLN
10.0000 [IU] | Freq: Every day | SUBCUTANEOUS | Status: DC
  Administered 2016-04-05: 10 [IU] via SUBCUTANEOUS
  Filled 2016-04-05 (×2): qty 0.1

## 2016-04-05 MED ORDER — SODIUM CHLORIDE 3 % IN NEBU
5.0000 mL | INHALATION_SOLUTION | Freq: Two times a day (BID) | RESPIRATORY_TRACT | Status: DC
  Filled 2016-04-05: qty 15

## 2016-04-05 NOTE — Progress Notes (Signed)
Pt refused Bed alarm on

## 2016-04-05 NOTE — Progress Notes (Signed)
Physical Therapy Treatment Patient Details Name: Mary [name] MRN: [id number] DOB: Dec 26, 1975 Today's Date: 04/05/2016    History of Present Illness Patient is a 40 yo female admitted 04/01/16 with DOE, hypoxic resp failure.  Patient with COPD exacerbation, CHF.   PMH:  COPD on 3 liters O2 home, DVT/PE, PAH, cor pulmonale, CHF, obesity, OSA on BiPAP at night    PT Comments    Pt ambulated 140' without an assistive device, SaO2 dropped to 80% on 6L O2 while walking, up to 93% with standing rest break and pursed lip breathing. 3/4 dyspnea.  Pt requiring higher flow of O2 and SaO2 dropped lower compared to previous PT session.    Follow Up Recommendations  No PT follow up;Supervision for mobility/OOB     Equipment Recommendations  None recommended by PT    Recommendations for Other Services       Precautions / Restrictions Precautions Precautions: Other (comment) Precaution Comments: On O2, monitor sats Restrictions Weight Bearing Restrictions: No    Mobility  Bed Mobility Overal bed mobility: Independent                Transfers Overall transfer level: Independent Equipment used: None                Ambulation/Gait Ambulation/Gait assistance: Independent Ambulation Distance (Feet): 140 Feet Assistive device: None Gait Pattern/deviations: Decreased step length - right;Decreased step length - left;Step-through pattern Gait velocity: decreased Gait velocity interpretation: Below normal speed for age/gender General Gait Details: Patient with slow steady gait.  Decreased speed to help minimize DOE. 2 standing rest breaks due to SOB.   During gait with O2 at 6 l/min, O2 sats decreased to 80/%, 3/4 dyspnea, with HR up to 112.  O2 sats up to 93% with standing rest break and pursed-lip breathing.   Stairs            Wheelchair Mobility    Modified Rankin (Stroke Patients Only)       Balance Overall balance assessment: Independent                                   Cognition Arousal/Alertness: Awake/alert Behavior During Therapy: WFL for tasks assessed/performed Overall Cognitive Status: Within Functional Limits for tasks assessed                      Exercises General Exercises - Upper Extremity Shoulder Flexion: AROM;Both;10 reps;Seated General Exercises - Lower Extremity Long Arc Quad: AROM;Both;15 reps;Seated    General Comments        Pertinent Vitals/Pain Pain Assessment: No/denies pain    Home Living                      Prior Function            PT Goals (current goals can now be found in the care plan section) Acute Rehab PT Goals Patient Stated Goal: to be with my dogs, cats, and chickens PT Goal Formulation: With patient Time For Goal Achievement: 04/09/16 Potential to Achieve Goals: Good Progress towards PT goals: Progressing toward goals    Frequency  Min 3X/week    PT Plan Current plan remains appropriate    Co-evaluation             End of Session Equipment Utilized During Treatment: Oxygen Activity Tolerance: Treatment limited secondary to medical complications (Comment) (Limited by decrease in  O2 sats) Patient left: with call bell/phone within reach;in chair     Time: [id number] PT Time Calculation (min) (ACUTE ONLY): 21 min  Charges:  $Gait Training: 8-22 mins                    G Codes:      Tamala SerUhlenberg, Taylen Osorto Kistler 04/05/2016, 12:38 PM [phone]

## 2016-04-05 NOTE — Progress Notes (Signed)
[facility]                                                                              Patient Demographics  Mary [name], is a 40 y.o. female, DOB - 06-Jan-1976, ZOX:[id number]  Admit date - 04/01/2016   Admitting Physician Clydie Braun, MD  Outpatient Primary MD for the patient is Elvina Sidle, MD  Outpatient specialists: Dr. Gala Romney (cardiology)                                           Dr. Marchelle Gearing, pulmonology  LOS - 3  days    Chief Complaint  Patient presents with  . Shortness of Breath       Brief summary   BRADIE SANGIOVANNI is a 40 y.o. female with medical history significant of COPD oxygen dependent at 3 L, remote tobacco use, DVT/PE, PAH, and cor pulmonale; who presents with shortness of breath and productive cough. Symptoms started 2-3 days ago with increasing cough with clear sputum production.  Further workup was consistent with COPD exacerbation/bronchitis along with acute on chronic diastolic CHF.   Assessment & Plan     Acute On chronic hypoxic And hypercarbic respiratory failure : Multifactorial due to diastolic CHF, severe COPD, pulmonary hypertension, OSA/OHS  COPD exacerbation - Overnight events noted, ABG had shown PCO2 of 83, patient was placed on BiPAP overnight (she uses BiPAP at home as well at night) -  wheezing today with increased O2 requirements, increase IV steroids, placed on Brovana, Pulmicort, IV Levaquin, DuoNeb nebs every 4 hours -  consult with pulmonology, discussed with Dr. Kendrick Fries  Acute on chronic diastolic CHF exacerbation  - Patient was placed on IV diuresis, negative balance of 4.7 L, also received by mouth Zaroxolyn  - Weight down from 198 lbs -> 189  - She is on oral torsemide at home 20 mg twice a day however she takes only once a day (feels a dizzy with the twice a day ). Restarted oral torsemide  Obstructive sleep apnea and morbid obesity.  -  counseled on weight loss, continue BiPAP daily  qhs  Hypokalemia.  -  Resolved   Mildly elevated d-dimer on admission.  - CT angiogram chest negative, negative lower extremity venous ultrasound.  Hyperglycemia - Obtain hemoglobin A1c, placed on sliding scale insulin, meal coverage and Lantus   Code Status:full CODE STATUS   Family Communication: Discussed in detail with the patient, all imaging results, lab results explained to the patient  Disposition Plan:  Time Spent in minutes 25 minutes  Procedures  CTA chest  Consults   pulmonology  DVT Prophylaxis  Lovenox   Medications  Scheduled Meds: . arformoterol  15 mcg Nebulization BID  . benzonatate  200 mg Oral TID  . enoxaparin (LOVENOX) injection  40 mg Subcutaneous Q24H  . guaiFENesin  600 mg Oral BID  . insulin aspart  0-15 Units Subcutaneous TID WC  . insulin aspart  0-5 Units Subcutaneous QHS  . ipratropium-albuterol  3 mL Nebulization Q4H  . levofloxacin (  LEVAQUIN) IV  500 mg Intravenous Q24H  . methylPREDNISolone (SOLU-MEDROL) injection  60 mg Intravenous Q6H  . pantoprazole  40 mg Oral BID AC  . potassium chloride SA  40 mEq Oral Daily  . roflumilast  500 mcg Oral Daily  . torsemide  20 mg Oral Daily   Continuous Infusions:  PRN Meds:.acetaminophen **OR** [DISCONTINUED] acetaminophen, albuterol, [DISCONTINUED] ondansetron **OR** ondansetron (ZOFRAN) IV, polyethylene glycol   Antibiotics   Anti-infectives    Start     Dose/Rate Route Frequency Ordered Stop   04/05/16 1000  levofloxacin (LEVAQUIN) IVPB 500 mg     500 mg 100 mL/hr over 60 Minutes Intravenous Every 24 hours 04/05/16 0902     04/03/16 0900  azithromycin (ZITHROMAX) tablet 500 mg  Status:  Discontinued     500 mg Oral Daily 04/03/16 0845 04/05/16 0902        Subjective:   Rashonda Warrior was seen and examined today.Overnight events noted, still feeling short of breath on minimal ambulation. Sats dropped down to 80% on physical therapy, on 6 L O2. Diffusely wheezing. No chest pain.  No fevers or chills. Patient denies dizziness, chest pain, abdominal pain, N/V/D/C, new weakness, numbess, tingling.   Objective:   Filed Vitals:   04/05/16 0521 04/05/16 0815 04/05/16 1105 04/05/16 1231  BP: 117/80     Pulse: 102 93 96   Temp: 97.7 F (36.5 C)     TempSrc: Oral     Resp: 23 20 24    Height:      Weight: 86.138 kg (189 lb 14.4 oz)     SpO2: 95% 90% 92% 80%    Intake/Output Summary (Last 24 hours) at 04/05/16 1249 Last data filed at 04/05/16 0945  Gross per 24 hour  Intake   1440 ml  Output   1950 ml  Net   -510 ml     Wt Readings from Last 3 Encounters:  04/05/16 86.138 kg (189 lb 14.4 oz)  01/22/16 91.853 kg (202 lb 8 oz)  01/21/16 90.81 kg (200 lb 3.2 oz)     Exam  General: Alert and oriented x 3  HEENT:  PERRLA, EOMI, Anicteric Sclera, mucous membranes moist.   Neck: Supple, no JVD, no masses  CVS: S1 S2 auscultated, no rubs, murmurs or gallops. Regular rate and rhythm.  Respiratory: Bilaterally diffuse expiratory wheezing   Abdomen: morbidly obese, Soft, nontender, nondistended, + bowel sounds  Ext: no cyanosis clubbing, trace or edema  Neuro: AAOx3, Cr N's II- XII. Strength 5/5 upper and lower extremities bilaterally  Skin: No rashes  Psych: Normal affect and demeanor, alert and oriented x3    Data Reviewed:  I have personally reviewed following labs and imaging studies  Micro Results No results found for this or any previous visit (from the past 240 hour(s)).  Radiology Reports Dg Chest 2 View  04/05/2016  CLINICAL DATA:  Shortness Breath EXAM: CHEST  2 VIEW COMPARISON:  04/04/2016 FINDINGS: The heart size and mediastinal contours are within normal limits. Both lungs are mildly hyperinflated. The visualized skeletal structures are unremarkable. IMPRESSION: COPD without acute abnormality. Electronically Signed   By: Alcide Clever M.D.   On: 04/05/2016 07:47   Dg Chest 2 View  04/01/2016  CLINICAL DATA:  Shortness of breath for 3  days. EXAM: CHEST  2 VIEW COMPARISON:  01/03/2016. FINDINGS: Trachea is midline. Heart size stable. Probable mild linear scarring at the left lung base. No airspace consolidation or pleural fluid. IMPRESSION: No acute findings. Electronically  Signed   By: Leanna BattlesMelinda  Blietz M.D.   On: 04/01/2016 18:12   Ct Angio Chest Pe W/cm &/or Wo Cm  04/02/2016  CLINICAL DATA:  Worsening shortness of breath.  History of PE EXAM: CT ANGIOGRAPHY CHEST WITH CONTRAST TECHNIQUE: Multidetector CT imaging of the chest was performed using the standard protocol during bolus administration of intravenous contrast. Multiplanar CT image reconstructions and MIPs were obtained to evaluate the vascular anatomy. CONTRAST:  100 cc Isovue 370 intravenous COMPARISON:  12/16/2015 FINDINGS: THORACIC INLET/BODY WALL: No acute abnormality. MEDIASTINUM: Normal heart size. No pericardial effusion. Negative aorta. CTA of the pulmonary arteries is limited by respiratory motion, best obtainable due to patient condition. No evidence of pulmonary embolism. Hilar nodal enlargement which is likely reactive. Dilated main pulmonary artery at 34 mm. Patient has known pulmonary hypertension. LUNG WINDOWS: Diffuse airway thickening. Tracheomalacia with flattening. Patchy air trapping with overinflated right lower lobe, chronic. Mild patchy atelectasis. There is no edema, consolidation, effusion, or pneumothorax. Stable ground-glass nodule in the right upper lobe measuring 18 mm, without solid nodule. UPPER ABDOMEN: No acute findings.  Hepatic steatosis. OSSEOUS: No acute fracture.  No suspicious lytic or blastic lesions. Review of the MIP images confirms the above findings. IMPRESSION: 1. No evidence of acute pulmonary embolism. Study degraded by unavoidable respiratory motion. 2. COPD with bronchitis and air trapping. 3. Stable right upper lobe ground-glass nodule which requires yearly CT surveillance. Electronically Signed   By: Marnee SpringJonathon  Watts M.D.   On:  04/02/2016 05:32   Dg Chest Port 1 View  04/04/2016  CLINICAL DATA:  Hypoxia.  Productive cough. EXAM: PORTABLE CHEST 1 VIEW COMPARISON:  04/01/2016 FINDINGS: A single AP portable view of the chest demonstrates no focal airspace consolidation or alveolar edema. The lungs are grossly clear. There is no large effusion or pneumothorax. Cardiac and mediastinal contours appear unremarkable. No interval change. IMPRESSION: No active disease. Electronically Signed   By: Ellery Plunkaniel R Mitchell M.D.   On: 04/04/2016 20:24    CBC  Recent Labs Lab 04/01/16 1750 04/02/16 0207 04/05/16 0346  WBC 7.1 6.9 17.3*  HGB 15.1* 14.5 16.4*  HCT 46.3* 44.5 50.6*  PLT 155 140* 190  MCV 100.0 100.2* 102.2*  MCH 32.6 32.7 33.1  MCHC 32.6 32.6 32.4  RDW 13.1 13.1 12.9    Chemistries   Recent Labs Lab 04/01/16 1750 04/02/16 0207 04/03/16 0454 04/04/16 0300 04/05/16 0346  NA 136 137  --  134* 135  K 3.6 3.2* 3.4* 4.0 4.4  CL 96* 95*  --  82* 81*  CO2 29 28  --  35* 40*  GLUCOSE 122* 201*  --  399* 308*  BUN 6 6  --  32* 43*  CREATININE 0.63 0.75  --  0.87 0.98  CALCIUM 9.4 9.2  --  10.6* 9.9  MG  --   --  1.9  --   --    ------------------------------------------------------------------------------------------------------------------ estimated creatinine clearance is 80.1 mL/min (by C-G formula based on Cr of 0.98). ------------------------------------------------------------------------------------------------------------------ No results for input(s): HGBA1C in the last 72 hours. ------------------------------------------------------------------------------------------------------------------ No results for input(s): CHOL, HDL, LDLCALC, TRIG, CHOLHDL, LDLDIRECT in the last 72 hours. ------------------------------------------------------------------------------------------------------------------ No results for input(s): TSH, T4TOTAL, T3FREE, THYROIDAB in the last 72 hours.  Invalid input(s):  FREET3 ------------------------------------------------------------------------------------------------------------------ No results for input(s): VITAMINB12, FOLATE, FERRITIN, TIBC, IRON, RETICCTPCT in the last 72 hours.  Coagulation profile No results for input(s): INR, PROTIME in the last 168 hours.  No results for input(s): DDIMER in the last  72 hours.  Cardiac Enzymes No results for input(s): CKMB, TROPONINI, MYOGLOBIN in the last 168 hours.  Invalid input(s): CK ------------------------------------------------------------------------------------------------------------------ Invalid input(s): POCBNP   Recent Labs  04/04/16 1217 04/04/16 1635 04/04/16 2147 04/05/16 0655 04/05/16 1107  GLUCAP 478* 288* 175* 269* 368*     Preethi Scantlebury M.D. [facility] 04/05/2016, 12:49 PM  Pager: [phone] Between 7am to 7pm - call Pager - [phone]  After 7pm go to [email or web address] - password [email or web address]  Call night coverage person covering after 7pm

## 2016-04-05 NOTE — Consult Note (Signed)
Name: Mary [name] MRN: [id number] DOB: 1975-12-25    ADMISSION DATE:  04/01/2016 CONSULTATION DATE:  04/05/16  REFERRING MD :  Dr. Isidoro Donning   CHIEF COMPLAINT:  SOB, productive cough, acute on chronic hypoxic / hypercarbic respiratory failure   HISTORY OF PRESENT ILLNESS:  40 y/o F, former smoker (quit 2013) with PMH of DVT of RUE, PE, 2L oxygen dependent COPD (severe obstructive lung disease with reversible component, restriction, severe diffusion defect consistent with emphysema), mild PH by RHC 01/2016, Cor Pulmonale, childhood lung "surgeries" (born premature) and OSA on BiPAP (reports compliance - "that is my pacifier" who presented to the [facility] ER on 4/27 with worsening SOB.   The patient reports she is followed by the [facility] transplant team.  Less than two months prior to admission, she was able to ambulate for a 6 minute walk test without O2.  She was told by the transplant team that she is not "sick enough" for transplant at this point.  In early January, her home pulmonary regimen was changed from Advair / Spiriva to [employer], Daliresp and Spiriva Respimat.  She reports she was feeling great - able to get out of the house, let her dogs out, shop for groceries, cook and perform ADL's without difficulty.  On Tues 4/25, she began feeling poorly with decreased energy and inability to perform ADL's prompting her to visit the ER.  The patient reports increased shortness of breath, cough with inability to produce sputum but feels she needs to cough up secretions, and significant desaturations.  She reports she is unable to identify what triggered the current episode.  She denies significant swelling / weight gain.   On admission, she was noted to have desaturations into the 80's on 4L.  The patient was admitted per [facility].  She was treated with IV steroids, nebulized bronchodilators, IV levaquin and bipap support.  The patient had an elevated D-Dimer on admission and a CTA of the chest was assessed for PE  which was negative but demonstrated changes consistent with COPD with bronchitis & air trapping, stable RUL ground-glass nodule.  LE doppler's also assessed and negative for DVT.  The patient entered at 198 lbs and has been diuresed to 189 (net negative ).  Despite diuresis, she has been slow to recover with ongoing desaturations despite increased O2.    PCCM consulted for evaluation.   PAST MEDICAL HISTORY :   has a past medical history of COPD (chronic obstructive pulmonary disease) (HCC); History of alcoholism (HCC); Irregular menses; DVT of upper extremity (deep vein thrombosis) [facility]) (April 2013); Pulmonary embolus [facility]) (April 2013); Moderate to severe pulmonary hypertension [facility]) (April 2013); Hepatic cirrhosis (HCC); Axillary adenopathy; Periodontal disease; Chronic right-sided CHF (congestive heart failure) (HCC) (03/23/2012); Pneumonia (~ 1985; 01/2011); History of chronic bronchitis; Exertional shortness of breath; On home oxygen therapy; OSA (obstructive sleep apnea); and Migraine.  has past surgical history that includes Cardiac surgery (04/16/77); Lung surgery (12-04-77); Appendectomy (05/03/2013); Cardiac catheterization (03/2012); laparoscopic appendectomy (N/A, 05/03/2013); right heart catheterization (N/A, 03/07/2012); and Cardiac catheterization (N/A, 01/08/2016).    Prior to Admission medications   Medication Sig Start Date End Date Taking? Authorizing Provider  albuterol (PROVENTIL HFA;VENTOLIN HFA) 108 (90 BASE) MCG/ACT inhaler Inhale 2 puffs into the lungs every 6 (six) hours as needed for wheezing or shortness of breath. Patient taking differently: Inhale 2 puffs into the lungs 4 (four) times daily.  09/06/14  Yes Kalman Shan, MD  fluticasone furoate-vilanterol (BREO ELLIPTA) 100-25 MCG/INH AEPB Inhale 1  puff into the lungs daily. 02/02/16  Yes Kalman ShanMurali Ramaswamy, MD  polyethylene glycol (MIRALAX / GLYCOLAX) packet Take 17 g by mouth daily as needed for mild constipation.  01/09/16  Yes Lonia BloodJeffrey T McClung, MD  potassium chloride SA (K-DUR,KLOR-CON) 20 MEQ tablet Take 1 tablet (20 mEq total) by mouth daily. 01/29/16  Yes Dolores Pattyaniel R Bensimhon, MD  roflumilast (DALIRESP) 500 MCG TABS tablet First week take 1 tablet every other day then 1 tablet by mouth daily 02/02/16  Yes Kalman ShanMurali Ramaswamy, MD  Tiotropium Bromide Monohydrate (SPIRIVA RESPIMAT) 1.25 MCG/ACT AERS Inhale 2 puffs into the lungs 2 (two) times daily. 02/02/16  Yes Kalman ShanMurali Ramaswamy, MD  torsemide (DEMADEX) 20 MG tablet Take 1 tablet (20 mg total) by mouth 2 (two) times daily. 01/09/16  Yes Lonia BloodJeffrey T McClung, MD   No Known Allergies  FAMILY HISTORY:  family history includes Alcohol abuse in her father; Heart disease in her father; Hypertension in her father; Other in her brother.   SOCIAL HISTORY:  reports that she quit smoking about 4 years ago. Her smoking use included Cigarettes. She has a 25 pack-year smoking history. She has never used smokeless tobacco. She reports that she does not drink alcohol or use illicit drugs.  REVIEW OF SYSTEMS:  POSITIVES IN BOLD Constitutional: Negative for fever, chills, weight loss, malaise/fatigue and diaphoresis.  HENT: Negative for hearing loss, ear pain, nosebleeds, congestion, sore throat, neck pain, tinnitus and ear discharge.   Eyes: Negative for blurred vision, double vision, photophobia, pain, discharge and redness.  Respiratory: Negative for cough, hemoptysis, sputum production, shortness of breath, wheezing and stridor.   Cardiovascular: Negative for chest pain, palpitations, orthopnea, claudication, leg swelling and PND.  Gastrointestinal: Negative for heartburn, nausea, vomiting, abdominal pain, diarrhea, constipation, blood in stool and melena.  Genitourinary: Negative for dysuria, urgency, frequency, hematuria and flank pain.  Musculoskeletal: Negative for myalgias, back pain, joint pain and falls.  Skin: Negative for itching and rash.  Neurological: Negative for  dizziness, tingling, tremors, sensory change, speech change, focal weakness, seizures, loss of consciousness, weakness and headaches.  Endo/Heme/Allergies: Negative for environmental allergies and polydipsia. Does not bruise/bleed easily.  SUBJECTIVE:   VITAL SIGNS: Temp:  [97.7 F (36.5 C)-98 F (36.7 C)] 97.7 F (36.5 C) (05/01 0521) Pulse Rate:  [93-109] 96 (05/01 1105) Resp:  [20-28] 24 (05/01 1105) BP: (117-122)/(80-90) 117/80 mmHg (05/01 0521) SpO2:  [80 %-99 %] 80 % (05/01 1231) Weight:  [189 lb 14.4 oz (86.138 kg)] 189 lb 14.4 oz (86.138 kg) (05/01 0521)  PHYSICAL EXAMINATION: General:  Chronically ill appearing female in NAD, sitting up in bed  Neuro:  AAOx4, speech clear, MAE HEENT:  MM pink/moist, no evidence of thrush, hoarse voice  Cardiovascular:  s1s2 rrr, no m/r/g, tachy  Lungs:  Prolonged exp phase, dyspnea with exertion, slow to recover, able to speak full sentences, lungs bilaterally coarse with wheezing, diminished air movement  Abdomen:  Obese/soft, bsx4 active  Musculoskeletal:  No acute deformities  Skin:  Warm/dry, no edema, clubbing of nail beds noted   Recent Labs Lab 04/02/16 0207 04/03/16 0454 04/04/16 0300 04/05/16 0346  NA 137  --  134* 135  K 3.2* 3.4* 4.0 4.4  CL 95*  --  82* 81*  CO2 28  --  35* 40*  BUN 6  --  32* 43*  CREATININE 0.75  --  0.87 0.98  GLUCOSE 201*  --  399* 308*    Recent Labs Lab 04/01/16 1750 04/02/16 0207 04/05/16 0346  HGB 15.1* 14.5 16.4*  HCT 46.3* 44.5 50.6*  WBC 7.1 6.9 17.3*  PLT 155 140* 190   Dg Chest 2 View  04/05/2016  CLINICAL DATA:  Shortness Breath EXAM: CHEST  2 VIEW COMPARISON:  04/04/2016 FINDINGS: The heart size and mediastinal contours are within normal limits. Both lungs are mildly hyperinflated. The visualized skeletal structures are unremarkable. IMPRESSION: COPD without acute abnormality. Electronically Signed   By: Alcide Clever M.D.   On: 04/05/2016 07:47   Dg Chest Port 1  View  04/04/2016  CLINICAL DATA:  Hypoxia.  Productive cough. EXAM: PORTABLE CHEST 1 VIEW COMPARISON:  04/01/2016 FINDINGS: A single AP portable view of the chest demonstrates no focal airspace consolidation or alveolar edema. The lungs are grossly clear. There is no large effusion or pneumothorax. Cardiac and mediastinal contours appear unremarkable. No interval change. IMPRESSION: No active disease. Electronically Signed   By: Ellery Plunk M.D.   On: 04/04/2016 20:24   SIGNIFICANT EVENTS  4/27  Admit with SOB 5/01  PCCM consulted   STUDIES:  01/08/16   RHC >> mild PAH with mildly elevated PVR, normal PCWP.  RA = 11, RV = 41/4/8, PA = 45/16 (35), PCW = 11, Fick cardiac output/index = 4.5/2.4, PVR = 5.3 WU, FA sat = 80%, PA sat = 51%, 50% 04/2012  PFT's >> severe obstructive lung disease with reversible component, restriction, severe diffusion defect consistent with emphysema 4/28  CTA Chest >> no PE, motion degraded, COPD with bronchitis & air trapping, stable RUL ground-glass nodule  4/29  LE Doppler >> negative for DVT    ASSESSMENT / PLAN:   1. Acute on Chronic Hypercarbic / Hypoxic Respiratory Failure - in setting of severe obstructive disease.  ABG reviewed, likely not far from baseline  Plan: Ensure compliance with nocturnal BiPAP therapy  Mobilize as tolerated  No further ABG's unless clinical change  Wean oxygen for sats 88-95%   2. COPD with acute exacerbation - severe obstructive disease, former tobacco abuse, with reversible component on PFT's in 2013, on Brio, Daliresp, Spiriva, PRN albuterol at baseline. Previously referred to [facility] lung transplant program & was seen 2,02/2016 and declined due to BMI.  Clear CXR / no evidence of infiltrate.  Component of tracheomalacia on CT scan which appears to be new which could be contributing to mucus plugging.   Suspect COPD exacerbation with component of Cor Pulmonale.  Plan: Continue Brovana  Add scheduled Pulmicort  Duonebs Q4  for now Continue levaquin, recommend 5 days for AECOPD Continue Mucinex Nebulized saline BID   Pulmonary hygiene - IS, mobilize  3. RUL Ground Glass Nodule - stable appearance on CT  Plan: Will need outpatient follow up with pulmonary / serial monitoring of nodule   4. OSA   Plan: Continue nocturnal BiPAP with bleed in O2   5. Mild PAH - based on RHC 2017, on demadex at baseline, suspect dry weight is around 189-190 Cor Pulmonale - suspect element of decompensation   Plan: Continue demadex Monitor I/O's Daily weights   5. Hx PE, DVT - in 2013, unclear etiology, Rx'd with coumadin from 4-05/2012  Canary Brim, NP-C [facility] Pgr: [phone] or if no answer [phone] 04/05/2016, 1:30 PM

## 2016-04-05 NOTE — Care Management Note (Signed)
  Case Management Note  Patient Details  Name: Mary [name] MRN: [id number] Date of Birth: 11/25/1976  Subjective/Objective:                    Action/Plan:  PTA - independent from home with husband.  Pt confirmed that she does see the PCP listed on facesheet on a regular basis ; denies issues/concerns/hardship with obtaining medications.  PT is not THN appropriate.  Pt ambulating independently in the room and in the halls. NO CM needs determined at this time.   Expected Discharge Date:                  Expected Discharge Plan:  Home/Self Care  In-House Referral:     Discharge planning Services  CM Consult  Post Acute Care Choice:    Choice offered to:     DME Arranged:    DME Agency:     HH Arranged:    HH Agency:     Status of Service:  In process, will continue to follow  Medicare Important Message Given:    Date Medicare IM Given:    Medicare IM give by:    Date Additional Medicare IM Given:    Additional Medicare Important Message give by:     If discussed at Long Length of Stay Meetings, dates discussed:    Additional Comments: 04/05/2016  Pt is on home continuous oxygen 3 liters[facility]  Husband will bring portable oxygen tank at discharge.    Cherylann ParrClaxton, Ebonie Westerlund S, RN 04/05/2016, 10:04 AM

## 2016-04-06 DIAGNOSIS — E1310 Other specified diabetes mellitus with ketoacidosis without coma: Secondary | ICD-10-CM

## 2016-04-06 DIAGNOSIS — I5032 Chronic diastolic (congestive) heart failure: Secondary | ICD-10-CM

## 2016-04-06 DIAGNOSIS — R0602 Shortness of breath: Secondary | ICD-10-CM

## 2016-04-06 DIAGNOSIS — Z794 Long term (current) use of insulin: Secondary | ICD-10-CM

## 2016-04-06 DIAGNOSIS — G4733 Obstructive sleep apnea (adult) (pediatric): Secondary | ICD-10-CM

## 2016-04-06 LAB — BASIC METABOLIC PANEL
Anion gap: 14 (ref 5–15)
BUN: 35 mg/dL — AB (ref 6–20)
CO2: 40 mmol/L — ABNORMAL HIGH (ref 22–32)
Calcium: 10.1 mg/dL (ref 8.9–10.3)
Chloride: 77 mmol/L — ABNORMAL LOW (ref 101–111)
Creatinine, Ser: 0.79 mg/dL (ref 0.44–1.00)
Glucose, Bld: 344 mg/dL — ABNORMAL HIGH (ref 65–99)
POTASSIUM: 3.9 mmol/L (ref 3.5–5.1)
SODIUM: 131 mmol/L — AB (ref 135–145)

## 2016-04-06 LAB — GLUCOSE, CAPILLARY
GLUCOSE-CAPILLARY: 356 mg/dL — AB (ref 65–99)
GLUCOSE-CAPILLARY: 462 mg/dL — AB (ref 65–99)
GLUCOSE-CAPILLARY: 293 mg/dL — AB (ref 65–99)
GLUCOSE-CAPILLARY: 464 mg/dL — AB (ref 65–99)

## 2016-04-06 LAB — CBC
HCT: 45 % (ref 36.0–46.0)
Hemoglobin: 14.9 g/dL (ref 12.0–15.0)
MCH: 32.7 pg (ref 26.0–34.0)
MCHC: 33.1 g/dL (ref 30.0–36.0)
MCV: 98.7 fL (ref 78.0–100.0)
PLATELETS: 141 10*3/uL — AB (ref 150–400)
RBC: 4.56 MIL/uL (ref 3.87–5.11)
RDW: 12.3 % (ref 11.5–15.5)
WBC: 9.9 10*3/uL (ref 4.0–10.5)

## 2016-04-06 LAB — HEMOGLOBIN A1C
Hgb A1c MFr Bld: 5.9 % — ABNORMAL HIGH (ref 4.8–5.6)
MEAN PLASMA GLUCOSE: 123 mg/dL

## 2016-04-06 MED ORDER — METHYLPREDNISOLONE SODIUM SUCC 125 MG IJ SOLR
60.0000 mg | Freq: Three times a day (TID) | INTRAMUSCULAR | Status: DC
  Administered 2016-04-06 – 2016-04-07 (×2): 60 mg via INTRAVENOUS
  Filled 2016-04-06 (×2): qty 2

## 2016-04-06 MED ORDER — INSULIN GLARGINE 100 UNIT/ML ~~LOC~~ SOLN
15.0000 [IU] | Freq: Two times a day (BID) | SUBCUTANEOUS | Status: DC
  Administered 2016-04-06 – 2016-04-07 (×2): 15 [IU] via SUBCUTANEOUS
  Filled 2016-04-06 (×3): qty 0.15

## 2016-04-06 MED ORDER — INSULIN ASPART 100 UNIT/ML ~~LOC~~ SOLN
0.0000 [IU] | Freq: Three times a day (TID) | SUBCUTANEOUS | Status: DC
  Administered 2016-04-06: 11 [IU] via SUBCUTANEOUS
  Administered 2016-04-07 (×3): 7 [IU] via SUBCUTANEOUS
  Administered 2016-04-08: 15 [IU] via SUBCUTANEOUS
  Administered 2016-04-08: 4 [IU] via SUBCUTANEOUS
  Administered 2016-04-08 – 2016-04-09 (×3): 11 [IU] via SUBCUTANEOUS
  Administered 2016-04-09: 4 [IU] via SUBCUTANEOUS

## 2016-04-06 MED ORDER — LEVOFLOXACIN 500 MG PO TABS
500.0000 mg | ORAL_TABLET | Freq: Every day | ORAL | Status: DC
  Administered 2016-04-07 – 2016-04-11 (×5): 500 mg via ORAL
  Filled 2016-04-06 (×5): qty 1

## 2016-04-06 MED ORDER — INSULIN GLARGINE 100 UNIT/ML ~~LOC~~ SOLN
10.0000 [IU] | Freq: Two times a day (BID) | SUBCUTANEOUS | Status: DC
  Filled 2016-04-06: qty 0.1

## 2016-04-06 MED ORDER — INSULIN GLARGINE 100 UNIT/ML ~~LOC~~ SOLN: 15.0000 [IU] | Freq: Two times a day (BID) | SUBCUTANEOUS | Status: DC

## 2016-04-06 MED ORDER — INSULIN ASPART 100 UNIT/ML ~~LOC~~ SOLN
10.0000 [IU] | Freq: Once | SUBCUTANEOUS | Status: AC
  Administered 2016-04-06: 10 [IU] via SUBCUTANEOUS

## 2016-04-06 MED ORDER — INSULIN ASPART 100 UNIT/ML ~~LOC~~ SOLN
5.0000 [IU] | Freq: Three times a day (TID) | SUBCUTANEOUS | Status: DC
  Administered 2016-04-06: 5 [IU] via SUBCUTANEOUS

## 2016-04-06 NOTE — Progress Notes (Signed)
CBG = [phone] tonight. MD paged. Novolog and Lantus insulin orders received. Pt to receive another dose of Solu-Medrol at 2 AM. Will continue to monitor.

## 2016-04-06 NOTE — Progress Notes (Signed)
Name: Mary [name] MRN: [id number] DOB: [birth year]    ADMISSION DATE:  04/01/2016 CONSULTATION DATE:  04/05/16  REFERRING MD :  Dr. Isidoro Donning   CHIEF COMPLAINT:  SOB, productive cough, acute on chronic hypoxic / hypercarbic respiratory failure   HISTORY OF PRESENT ILLNESS:  40 y/o F, former smoker (quit 2013) with PMH of DVT of RUE, PE, 2L oxygen dependent COPD (severe obstructive lung disease with reversible component, restriction, severe diffusion defect consistent with emphysema), mild PH by RHC 01/2016, Cor Pulmonale, childhood lung "surgeries" (born premature) and OSA on BiPAP (reports compliance - "that is my pacifier" who presented to the [facility] ER on 4/27 with worsening SOB.   The patient reports she is followed by the [facility] transplant team.  Less than two months prior to admission, she was able to ambulate for a 6 minute walk test without O2.  She was told by the transplant team that she is not "sick enough" for transplant at this point.  In early January, her home pulmonary regimen was changed from Advair / Spiriva to [employer], Daliresp and Spiriva Respimat.  She reports she was feeling great - able to get out of the house, let her dogs out, shop for groceries, cook and perform ADL's without difficulty.  On Tues 4/25, she began feeling poorly with decreased energy and inability to perform ADL's prompting her to visit the ER.  The patient reports increased shortness of breath, cough with inability to produce sputum but feels she needs to cough up secretions, and significant desaturations.  She reports she is unable to identify what triggered the current episode.  She denies significant swelling / weight gain.   On admission, she was noted to have desaturations into the 80's on 4L.  The patient was admitted per [facility].  She was treated with IV steroids, nebulized bronchodilators, IV levaquin and bipap support.  The patient had an elevated D-Dimer on admission and a CTA of the chest was assessed for PE  which was negative but demonstrated changes consistent with COPD with bronchitis & air trapping, stable RUL ground-glass nodule.  LE doppler's also assessed and negative for DVT.  The patient entered at 198 lbs and has been diuresed to 189 (net negative ).  Despite diuresis, she has been slow to recover with ongoing desaturations despite increased O2.    PCCM consulted for evaluation.   SUBJECTIVE: breathing a little better. Significant chest congestion but unable to get secretions up. Sats drop easily with coughing or talking. Her normal is 88-92% on 3L. Headache  VITAL SIGNS: Temp:  [97.1 F (36.2 C)-97.8 F (36.6 C)] 97.1 F (36.2 C) (05/02 0539) Pulse Rate:  [81-114] 81 (05/02 0539) Resp:  [22-24] 22 (05/02 0539) BP: (102-121)/(63-80) 102/63 mmHg (05/02 0539) SpO2:  [80 %-93 %] 93 % (05/02 0923) Weight:  [85.821 kg (189 lb 3.2 oz)] 85.821 kg (189 lb 3.2 oz) (05/02 0539)  PHYSICAL EXAMINATION: General:  Chronically ill appearing female in NAD, sitting up in bed  Neuro:  AAOx4, speech clear, MAE HEENT:  MM pink/moist, no evidence of thrush, hoarse voice  Cardiovascular:  s1s2 rrr, no m/r/g, tachy  Lungs:  Prolonged exp phase, dyspnea and hypoxia with exertion, slow to recover, able to speak full sentences, no wheeze today, diminished air movement  Abdomen:  Obese/soft, bsx4 active  Musculoskeletal:  No acute deformities  Skin:  Warm/dry, no edema, clubbing of nail beds noted   Recent Labs Lab 04/04/16 0300 04/05/16 0346 04/06/16 0358  NA 134*  135 131*  K 4.0 4.4 3.9  CL 82* 81* 77*  CO2 35* 40* 40*  BUN 32* 43* 35*  CREATININE 0.87 0.98 0.79  GLUCOSE 399* 308* 344*    Recent Labs Lab 04/02/16 0207 04/05/16 0346 04/06/16 0358  HGB 14.5 16.4* 14.9  HCT 44.5 50.6* 45.0  WBC 6.9 17.3* 9.9  PLT 140* 190 141*   Dg Chest 2 View  04/05/2016  CLINICAL DATA:  Shortness Breath EXAM: CHEST  2 VIEW COMPARISON:  04/04/2016 FINDINGS: The heart size and mediastinal  contours are within normal limits. Both lungs are mildly hyperinflated. The visualized skeletal structures are unremarkable. IMPRESSION: COPD without acute abnormality. Electronically Signed   By: Alcide Clever M.D.   On: 04/05/2016 07:47   Dg Chest Port 1 View  04/04/2016  CLINICAL DATA:  Hypoxia.  Productive cough. EXAM: PORTABLE CHEST 1 VIEW COMPARISON:  04/01/2016 FINDINGS: A single AP portable view of the chest demonstrates no focal airspace consolidation or alveolar edema. The lungs are grossly clear. There is no large effusion or pneumothorax. Cardiac and mediastinal contours appear unremarkable. No interval change. IMPRESSION: No active disease. Electronically Signed   By: Ellery Plunk M.D.   On: 04/04/2016 20:24   SIGNIFICANT EVENTS  4/27  Admit with SOB 5/01  PCCM consulted   STUDIES:  01/08/16   RHC >> mild PAH with mildly elevated PVR, normal PCWP.  RA = 11, RV = 41/4/8, PA = 45/16 (35), PCW = 11, Fick cardiac output/index = 4.5/2.4, PVR = 5.3 WU, FA sat = 80%, PA sat = 51%, 50% 04/2012  PFT's >> severe obstructive lung disease with reversible component, restriction, severe diffusion defect consistent with emphysema 4/28  CTA Chest >> no PE, motion degraded, COPD with bronchitis & air trapping, stable RUL ground-glass nodule  4/29  LE Doppler >> negative for DVT   ASSESSMENT / PLAN:  Acute on Chronic Hypercarbic / Hypoxic Respiratory Failure - in setting of severe obstructive disease.  ABG reviewed, likely not far from baseline  COPD with acute exacerbation - severe obstructive disease, former tobacco abuse, with reversible component on PFT's in 2013, on Brio, Daliresp, Spiriva, PRN albuterol at baseline. Previously referred to [facility] lung transplant program & was seen 2,02/2016 and declined due to BMI.  Clear CXR / no evidence of infiltrate.  Component of tracheomalacia on CT scan which appears to be new which could be contributing to mucus plugging.   Suspect COPD exacerbation with  component of Cor Pulmonale.  Acute bronchitis with suspected mucous plugging.   Plan: -Wean oxygen for sats 88-95% > have had to turn up to 5L this AM 5/2, if worsens witll need HFNC -Continue Brovana, Pulmicort  -Duonebs Q4 for now -Continue levaquin, recommend 5 days for AECOPD/acute bronchitis -Continue Mucinex -Continue hypertonic saline nebs, consider mucomyst nebs -Aggressive IS, FV -Mobilize as tolerated -Add Chest PT (vest) today if she will tolerate  RUL Ground Glass Nodule - stable appearance on CT  Plan: -Will need outpatient follow up with pulmonary / serial monitoring of nodule   OSA   Plan: -Continue nocturnal BiPAP with bleed in O2   Mild PAH - based on RHC 2017, on demadex at baseline, suspect dry weight is around 189-190 Cor Pulmonale - suspect element of decompensation   Plan: -Continue demadex -Monitor I/O's -Daily weights   Hx PE, DVT - in 2013, unclear etiology, Rx'd with coumadin from 4-05/2012  Joneen Roach, AGACNP-BC [facility] Pager [phone] or [phone]  04/06/2016  10:49 AM  Attending Note:  40 year old female with O2 dependent COPD who presents with SOB.  Hypercarbic and hypoxic respiratory failure.  On exam, mild end exp wheezes.  I reviewed chest CT myself, areas of air trapping and a stable RUL GGO nodule.  Discussed with PCCM-NP  Acute on chronic respiratory failure:  - BiPAP.  - Needs activity and wt loss.  COPD:  - Steroids.  - Inhalers as above.  - Pulmonary hygienes.  - Mobilize.  RUL GGO:  - CT of the chest in 1 year.  Pulmonary HTN:  - Demedex.  - Wt loss.  - Keep dry.  OSA:  - Nocturnal BiPAP.  PCCM will follow.  Patient seen and examined, agree with above note.  I dictated the care and orders written for this patient under my direction.  Alyson ReedyWesam G Nahjae Hoeg, MD [phone]

## 2016-04-06 NOTE — Progress Notes (Signed)
BIPAP set up at bedside, with FFM  And 4 lpm O2 bleed in.  Patient places herself on/off as needed. Desats easily.

## 2016-04-06 NOTE — Progress Notes (Signed)
Inpatient Diabetes Program Recommendations  AACE/ADA: New Consensus Statement on Inpatient Glycemic Control (2015)  Target Ranges:  Prepandial:   less than 140 mg/dL      Peak postprandial:   less than 180 mg/dL (1-2 hours)      Critically ill patients:  140 - 180 mg/dL   Review of Glycemic Control   Inpatient Diabetes Program Recommendations:  Insulin - Basal: Increase Lantus to 10 units BID during steroid therapy Correction (SSI): increase Novolog to RESISTANT scale  Insulin - Meal Coverage: . Thank you  Piedad ClimesGina Kevyn Boquet BSN, RN,CDE Inpatient Diabetes Coordinator [phone] (team pager)

## 2016-04-06 NOTE — Progress Notes (Signed)
CBG 462, MD notified.  Ordered to admin Novolog 15 units + 3 units (standing units) = 18 units.

## 2016-04-06 NOTE — Procedures (Signed)
Pt does not want CPT at 0000 and 0400 but states that she will take the treatments.

## 2016-04-06 NOTE — Progress Notes (Signed)
[facility]                                                                              Patient Demographics  Mary [name], is a 40 y.o. female, DOB - 1976-11-18, ZOX:[id number]  Admit date - 04/01/2016   Admitting Physician Clydie Braun, MD  Outpatient Primary MD for the patient is Elvina Sidle, MD  Outpatient specialists: Dr. Gala Romney (cardiology)                                           Dr. Marchelle Gearing, pulmonology  LOS - 4  days    Chief Complaint  Patient presents with  . Shortness of Breath       Brief summary   Mary [name] is a 40 y.o. female with medical history significant of COPD oxygen dependent at 3 L, remote tobacco use, DVT/PE, PAH, and cor pulmonale; who presents with shortness of breath and productive cough. Symptoms started 2-3 days ago with increasing cough with clear sputum production.  Further workup was consistent with COPD exacerbation/bronchitis along with acute on chronic diastolic CHF.   Assessment & Plan     Acute On chronic hypoxic And hypercarbic respiratory failure : Multifactorial due to diastolic CHF, severe COPD, pulmonary hypertension, OSA/OHS  COPD exacerbation- Feeling slightly better today - she uses BiPAP QHS,  -  Appreciate pulmonology conditions, continue IV steroids, placed on Brovana, Pulmicort, IV Levaquin, DuoNeb nebs every 4 hours -  Flutter valve, chest physiotherapy, Mucinex,  Acute on chronic diastolic CHF exacerbation  - Patient was placed on IV diuresis, negative balance of 4.9 L, also received by mouth Zaroxolyn  - Weight down from 198 lbs -> 189  - She is on oral torsemide at home 20 mg twice a day however she takes only once a day (feels a dizzy with the twice a day ). Restarted oral torsemide  Obstructive sleep apnea and morbid obesity.  -  counseled on weight loss, continue BiPAP daily qhs  Hypokalemia.  -  Resolved   Mildly elevated d-dimer on admission.  - CT angiogram chest  negative, negative lower extremity venous ultrasound.  Type 2 diabetes mellitus - A1c 5.9, hyperglycemia worse due to steroids. - Change Lantus to 10 units twice a day while on she is on steroids, resistance sliding-scale insulin, meal coverage  Code Status:full CODE STATUS   Family Communication: Discussed in detail with the patient, all imaging results, lab results explained to the patient  Disposition Plan:  Time Spent in minutes 25 minutes  Procedures  CTA chest  Consults   pulmonology  DVT Prophylaxis  Lovenox   Medications  Scheduled Meds: . arformoterol  15 mcg Nebulization BID  . benzonatate  200 mg Oral TID  . budesonide (PULMICORT) nebulizer solution  0.5 mg Nebulization BID  . enoxaparin (LOVENOX) injection  40 mg Subcutaneous Q24H  . guaiFENesin  600 mg Oral BID  . insulin aspart  0-20 Units Subcutaneous TID WC  . insulin aspart  0-5 Units Subcutaneous QHS  . insulin  aspart  5 Units Subcutaneous TID WC  . insulin glargine  10 Units Subcutaneous BID  . ipratropium-albuterol  3 mL Nebulization Q4H  . [START ON 04/07/2016] levofloxacin  500 mg Oral Daily  . methylPREDNISolone (SOLU-MEDROL) injection  60 mg Intravenous Q8H  . pantoprazole  40 mg Oral BID AC  . potassium chloride SA  40 mEq Oral Daily  . roflumilast  500 mcg Oral Daily  . sodium chloride HYPERTONIC  5 mL Nebulization BID  . torsemide  20 mg Oral Daily   Continuous Infusions:  PRN Meds:.acetaminophen **OR** [DISCONTINUED] acetaminophen, albuterol, [DISCONTINUED] ondansetron **OR** ondansetron (ZOFRAN) IV, polyethylene glycol   Antibiotics   Anti-infectives    Start     Dose/Rate Route Frequency Ordered Stop   04/07/16 1000  levofloxacin (LEVAQUIN) tablet 500 mg     500 mg Oral Daily 04/06/16 1025     04/05/16 1000  levofloxacin (LEVAQUIN) IVPB 500 mg  Status:  Discontinued     500 mg 100 mL/hr over 60 Minutes Intravenous Every 24 hours 04/05/16 0902 04/06/16 1025   04/03/16 0900   azithromycin (ZITHROMAX) tablet 500 mg  Status:  Discontinued     500 mg Oral Daily 04/03/16 0845 04/05/16 0902        Subjective:   Mary [name] was seen and examined today. Slightly getting better at this morning, wheezing is improving. CBGs are elevated due to steroids. Still somewhat hypoxic with O2 sats in the 80s. No chest pain. No fevers or chills. Patient denies dizziness, chest pain, abdominal pain, N/V/D/C, new weakness, numbess, tingling.   Objective:   Filed Vitals:   04/06/16 0918 04/06/16 0923 04/06/16 0930 04/06/16 1234  BP:      Pulse:      Temp:      TempSrc:      Resp:      Height:      Weight:      SpO2: 93% 93% 88% 91%    Intake/Output Summary (Last 24 hours) at 04/06/16 1252 Last data filed at 04/06/16 0931  Gross per 24 hour  Intake   1000 ml  Output   1300 ml  Net   -300 ml     Wt Readings from Last 3 Encounters:  04/06/16 85.821 kg (189 lb 3.2 oz)  01/22/16 91.853 kg (202 lb 8 oz)  01/21/16 90.81 kg (200 lb 3.2 oz)     Exam  General: Alert and oriented x 3  HEENT:    Neck:   CVS: S1 S2 auscultated, no rubs, murmurs or gallops. Regular rate and rhythm.  Respiratory: Diffuse wheezing  Abdomen: morbidly obese, Soft, nontender, nondistended, + bowel sounds  Ext: no cyanosis clubbing, trace or edema  Neuro: AAOx3, Cr N's II- XII. Strength 5/5 upper and lower extremities bilaterally  Skin: No rashes  Psych: Normal affect and demeanor, alert and oriented x3    Data Reviewed:  I have personally reviewed following labs and imaging studies  Micro Results No results found for this or any previous visit (from the past 240 hour(s)).  Radiology Reports Dg Chest 2 View  04/05/2016  CLINICAL DATA:  Shortness Breath EXAM: CHEST  2 VIEW COMPARISON:  04/04/2016 FINDINGS: The heart size and mediastinal contours are within normal limits. Both lungs are mildly hyperinflated. The visualized skeletal structures are unremarkable. IMPRESSION:  COPD without acute abnormality. Electronically Signed   By: Alcide CleverMark  Lukens M.D.   On: 04/05/2016 07:47   Dg Chest 2 View  04/01/2016  CLINICAL DATA:  Shortness of breath for 3 days. EXAM: CHEST  2 VIEW COMPARISON:  01/03/2016. FINDINGS: Trachea is midline. Heart size stable. Probable mild linear scarring at the left lung base. No airspace consolidation or pleural fluid. IMPRESSION: No acute findings. Electronically Signed   By: Leanna Battles M.D.   On: 04/01/2016 18:12   Ct Angio Chest Pe W/cm &/or Wo Cm  04/02/2016  CLINICAL DATA:  Worsening shortness of breath.  History of PE EXAM: CT ANGIOGRAPHY CHEST WITH CONTRAST TECHNIQUE: Multidetector CT imaging of the chest was performed using the standard protocol during bolus administration of intravenous contrast. Multiplanar CT image reconstructions and MIPs were obtained to evaluate the vascular anatomy. CONTRAST:  100 cc Isovue 370 intravenous COMPARISON:  12/16/2015 FINDINGS: THORACIC INLET/BODY WALL: No acute abnormality. MEDIASTINUM: Normal heart size. No pericardial effusion. Negative aorta. CTA of the pulmonary arteries is limited by respiratory motion, best obtainable due to patient condition. No evidence of pulmonary embolism. Hilar nodal enlargement which is likely reactive. Dilated main pulmonary artery at 34 mm. Patient has known pulmonary hypertension. LUNG WINDOWS: Diffuse airway thickening. Tracheomalacia with flattening. Patchy air trapping with overinflated right lower lobe, chronic. Mild patchy atelectasis. There is no edema, consolidation, effusion, or pneumothorax. Stable ground-glass nodule in the right upper lobe measuring 18 mm, without solid nodule. UPPER ABDOMEN: No acute findings.  Hepatic steatosis. OSSEOUS: No acute fracture.  No suspicious lytic or blastic lesions. Review of the MIP images confirms the above findings. IMPRESSION: 1. No evidence of acute pulmonary embolism. Study degraded by unavoidable respiratory motion. 2. COPD with  bronchitis and air trapping. 3. Stable right upper lobe ground-glass nodule which requires yearly CT surveillance. Electronically Signed   By: Marnee Spring M.D.   On: 04/02/2016 05:32   Dg Chest Port 1 View  04/04/2016  CLINICAL DATA:  Hypoxia.  Productive cough. EXAM: PORTABLE CHEST 1 VIEW COMPARISON:  04/01/2016 FINDINGS: A single AP portable view of the chest demonstrates no focal airspace consolidation or alveolar edema. The lungs are grossly clear. There is no large effusion or pneumothorax. Cardiac and mediastinal contours appear unremarkable. No interval change. IMPRESSION: No active disease. Electronically Signed   By: Ellery Plunk M.D.   On: 04/04/2016 20:24    CBC  Recent Labs Lab 04/01/16 1750 04/02/16 0207 04/05/16 0346 04/06/16 0358  WBC 7.1 6.9 17.3* 9.9  HGB 15.1* 14.5 16.4* 14.9  HCT 46.3* 44.5 50.6* 45.0  PLT 155 140* 190 141*  MCV 100.0 100.2* 102.2* 98.7  MCH 32.6 32.7 33.1 32.7  MCHC 32.6 32.6 32.4 33.1  RDW 13.1 13.1 12.9 12.3    Chemistries   Recent Labs Lab 04/01/16 1750 04/02/16 0207 04/03/16 0454 04/04/16 0300 04/05/16 0346 04/06/16 0358  NA 136 137  --  134* 135 131*  K 3.6 3.2* 3.4* 4.0 4.4 3.9  CL 96* 95*  --  82* 81* 77*  CO2 29 28  --  35* 40* 40*  GLUCOSE 122* 201*  --  399* 308* 344*  BUN 6 6  --  32* 43* 35*  CREATININE 0.63 0.75  --  0.87 0.98 0.79  CALCIUM 9.4 9.2  --  10.6* 9.9 10.1  MG  --   --  1.9  --   --   --    ------------------------------------------------------------------------------------------------------------------ estimated creatinine clearance is 98 mL/min (by C-G formula based on Cr of 0.79). ------------------------------------------------------------------------------------------------------------------  Recent Labs  04/04/16 1300 04/05/16 1318  HGBA1C 5.2 5.9*   ------------------------------------------------------------------------------------------------------------------ No results for  input(s):  CHOL, HDL, LDLCALC, TRIG, CHOLHDL, LDLDIRECT in the last 72 hours. ------------------------------------------------------------------------------------------------------------------ No results for input(s): TSH, T4TOTAL, T3FREE, THYROIDAB in the last 72 hours.  Invalid input(s): FREET3 ------------------------------------------------------------------------------------------------------------------ No results for input(s): VITAMINB12, FOLATE, FERRITIN, TIBC, IRON, RETICCTPCT in the last 72 hours.  Coagulation profile No results for input(s): INR, PROTIME in the last 168 hours.  No results for input(s): DDIMER in the last 72 hours.  Cardiac Enzymes No results for input(s): CKMB, TROPONINI, MYOGLOBIN in the last 168 hours.  Invalid input(s): CK ------------------------------------------------------------------------------------------------------------------ Invalid input(s): POCBNP   Recent Labs  04/05/16 0655 04/05/16 1107 04/05/16 1701 04/05/16 2029 04/06/16 0613 04/06/16 1148  GLUCAP 269* 368* 346* 458* 356* 462*     Zhi Geier M.D. [facility] 04/06/2016, 12:52 PM  Pager: [phone] Between 7am to 7pm - call Pager - [phone]  After 7pm go to [email or web address] - password [email or web address]  Call night coverage person covering after 7pm

## 2016-04-06 NOTE — Care Management Note (Signed)
  Case Management Note  Patient Details  Name: Jana HakimMichelle L Fobes MRN: [id number] Date of Birth: 04/14/1976  Subjective/Objective:                    Action/Plan:  PTA - independent from home with husband.  Pt confirmed that she does see the PCP listed on facesheet on a regular basis ; denies issues/concerns/hardship with obtaining medications.  PT is not [facility] appropriate.  Pt ambulating independently in the room and in the halls. NO CM needs determined at this time.   Expected Discharge Date:                  Expected Discharge Plan:  Home/Self Care  In-House Referral:     Discharge planning Services  CM Consult  Post Acute Care Choice:    Choice offered to:     DME Arranged:    DME Agency:     HH Arranged:    HH Agency:     Status of Service:  In process, will continue to follow  Medicare Important Message Given:    Date Medicare IM Given:    Medicare IM give by:    Date Additional Medicare IM Given:    Additional Medicare Important Message give by:     If discussed at Long Length of Stay Meetings, dates discussed:    Additional Comments: 04/06/2016 Discussed during LOS 04/06/16:  Pt made HRI with [facility] - agency accepted referral and pt informed.    04/05/16  Pt is on [facility] continuous [facility] liters[facility]  Husband will bring portable oxygen tank at discharge.    Cherylann ParrClaxton, Chibuikem Thang S, RN 04/06/2016, 8:26 AM Case Management Note  Patient Details  Name: Jana HakimMichelle L Innis MRN: [id number] Date of Birth: 03/10/1976  Subjective/Objective:                    Action/Plan:   Expected Discharge Date:                  Expected Discharge Plan:  Home/Self Care  In-House Referral:     Discharge planning Services  CM Consult  Post Acute Care Choice:    Choice offered to:     DME Arranged:    DME Agency:     HH Arranged:    HH Agency:     Status of Service:  In process, will continue to follow  Medicare Important Message Given:    Date Medicare IM  Given:    Medicare IM give by:    Date Additional Medicare IM Given:    Additional Medicare Important Message give by:     If discussed at Long Length of Stay Meetings, dates discussed:    Additional Comments:  Cherylann ParrClaxton, Aurel Nguyen S, RN 04/06/2016, 8:26 AM

## 2016-04-07 LAB — BASIC METABOLIC PANEL
Anion gap: 15 (ref 5–15)
BUN: 33 mg/dL — ABNORMAL HIGH (ref 6–20)
CALCIUM: 9.9 mg/dL (ref 8.9–10.3)
CO2: 38 mmol/L — ABNORMAL HIGH (ref 22–32)
CREATININE: 0.8 mg/dL (ref 0.44–1.00)
Chloride: 82 mmol/L — ABNORMAL LOW (ref 101–111)
GFR calc non Af Amer: >60 mL/min (ref >60)
Glucose, Bld: 240 mg/dL — ABNORMAL HIGH (ref 65–99)
Potassium: 3.9 mmol/L (ref 3.5–5.1)
SODIUM: 135 mmol/L (ref 135–145)

## 2016-04-07 LAB — GLUCOSE, CAPILLARY
GLUCOSE-CAPILLARY: 276 mg/dL — AB (ref 65–99)
Glucose-Capillary: 248 mg/dL — ABNORMAL HIGH (ref 65–99)
Glucose-Capillary: 209 mg/dL — ABNORMAL HIGH (ref 65–99)
Glucose-Capillary: 216 mg/dL — ABNORMAL HIGH (ref 65–99)

## 2016-04-07 LAB — CBC
HEMATOCRIT: 46.6 % — AB (ref 36.0–46.0)
HEMOGLOBIN: 15.3 g/dL — AB (ref 12.0–15.0)
MCH: 33 pg (ref 26.0–34.0)
MCHC: 32.8 g/dL (ref 30.0–36.0)
MCV: 100.6 fL — AB (ref 78.0–100.0)
PLATELETS: 171 10*3/uL (ref 150–400)
RBC: 4.63 MIL/uL (ref 3.87–5.11)
RDW: 12.3 % (ref 11.5–15.5)
WBC: 12 10*3/uL — AB (ref 4.0–10.5)

## 2016-04-07 MED ORDER — INSULIN GLARGINE 100 UNIT/ML ~~LOC~~ SOLN
18.0000 [IU] | Freq: Two times a day (BID) | SUBCUTANEOUS | Status: DC
  Administered 2016-04-07 – 2016-04-09 (×4): 18 [IU] via SUBCUTANEOUS
  Filled 2016-04-07 (×5): qty 0.18

## 2016-04-07 MED ORDER — METHYLPREDNISOLONE SODIUM SUCC 125 MG IJ SOLR
60.0000 mg | Freq: Two times a day (BID) | INTRAMUSCULAR | Status: DC
  Administered 2016-04-07 – 2016-04-08 (×3): 60 mg via INTRAVENOUS
  Filled 2016-04-07 (×3): qty 2

## 2016-04-07 MED ORDER — INSULIN ASPART 100 UNIT/ML ~~LOC~~ SOLN
5.0000 [IU] | Freq: Three times a day (TID) | SUBCUTANEOUS | Status: DC
  Administered 2016-04-07 – 2016-04-08 (×4): 5 [IU] via SUBCUTANEOUS

## 2016-04-07 MED ORDER — SALINE SPRAY 0.65 % NA SOLN
1.0000 | NASAL | Status: DC | PRN
  Filled 2016-04-07 (×2): qty 44

## 2016-04-07 NOTE — Progress Notes (Signed)
Name: Mary [name] MRN: [id number] DOB: Jul 01, 1976    ADMISSION DATE:  04/01/2016 CONSULTATION DATE:  04/05/16  REFERRING MD :  Dr. Isidoro Donning   CHIEF COMPLAINT:  SOB, productive cough, acute on chronic hypoxic / hypercarbic respiratory failure   HISTORY OF PRESENT ILLNESS:  40 y/o F, former smoker (quit 2013) with PMH of DVT of RUE, PE, 3L oxygen dependent COPD (severe obstructive lung disease with reversible component, restriction, severe diffusion defect consistent with emphysema), mild PH by RHC 01/2016, Cor Pulmonale, childhood lung "surgeries" (born premature) and OSA on BiPAP (reports compliance - "that is my pacifier" who presented to the [facility] ER on 4/27 with worsening SOB.   The patient reports she is followed by the [facility] transplant team.  Less than two months prior to admission, she was able to ambulate for a 6 minute walk test without O2.  She was told by the transplant team that she is not "sick enough" for transplant at this point.  In early January, her home pulmonary regimen was changed from Advair / Spiriva to [employer], Daliresp and Spiriva Respimat.  She reports she was feeling great - able to get out of the house, let her dogs out, shop for groceries, cook and perform ADL's without difficulty.  On Tues 4/25, she began feeling poorly with decreased energy and inability to perform ADL's prompting her to visit the ER.  The patient reports increased shortness of breath, cough with inability to produce sputum but feels she needs to cough up secretions, and significant desaturations.  She reports she is unable to identify what triggered the current episode.  She denies significant swelling / weight gain.   On admission, she was noted to have desaturations into the 80's on 4L.  The patient was admitted per [facility].  She was treated with IV steroids, nebulized bronchodilators, IV levaquin and bipap support.  The patient had an elevated D-Dimer on admission and a CTA of the chest was assessed for PE  which was negative but demonstrated changes consistent with COPD with bronchitis & air trapping, stable RUL ground-glass nodule.  LE doppler's also assessed and negative for DVT.  The patient entered at 198 lbs and has been diuresed to 189 (net negative ).  Despite diuresis, she has been slow to recover with ongoing desaturations despite increased O2.    PCCM consulted for evaluation.   SUBJECTIVE: Pt reports breathing is improved but not to baseline.  She walked with 6L O2 earlier in am and sats dropped to 84%, with pursed lipped breathing stayed at 91% with ambulation.  Able to cough up some sputum - feels inhaled saline and chest vest are working  VITAL SIGNS: Temp:  [97.8 F (36.6 C)-98.3 F (36.8 C)] 97.8 F (36.6 C) (05/03 1207) Pulse Rate:  [68-88] 88 (05/03 1207) Resp:  [16-20] 18 (05/03 1207) BP: (91-125)/(69-78) 118/78 mmHg (05/03 1207) SpO2:  [89 %-95 %] 90 % (05/03 1207) Weight:  [190 lb 3.2 oz (86.274 kg)] 190 lb 3.2 oz (86.274 kg) (05/03 0459)  PHYSICAL EXAMINATION: General:  Chronically ill appearing female in NAD, sitting up in chair Neuro:  AAOx4, speech clear, MAE HEENT:  MM pink/moist, no evidence of thrush, hoarse voice  Cardiovascular:  s1s2 rrr, no m/r/g, tachy  Lungs:  Prolonged exp phase, improved wheezing, remains on 4L Abdomen:  Obese/soft, bsx4 active  Musculoskeletal:  No acute deformities  Skin:  Warm/dry, no edema, clubbing of nail beds noted   Recent Labs Lab 04/05/16 0346 04/06/16 0358  04/07/16 0243  NA 135 131* 135  K 4.4 3.9 3.9  CL 81* 77* 82*  CO2 40* 40* 38*  BUN 43* 35* 33*  CREATININE 0.98 0.79 0.80  GLUCOSE 308* 344* 240*    Recent Labs Lab 04/05/16 0346 04/06/16 0358 04/07/16 0243  HGB 16.4* 14.9 15.3*  HCT 50.6* 45.0 46.6*  WBC 17.3* 9.9 12.0*  PLT 190 141* 171   No results found. SIGNIFICANT EVENTS  4/27  Admit with SOB 5/01  PCCM consulted   STUDIES:  01/08/16   RHC >> mild PAH with mildly elevated PVR, normal  PCWP.  RA = 11, RV = 41/4/8, PA = 45/16 (35), PCW = 11, Fick cardiac output/index = 4.5/2.4, PVR = 5.3 WU, FA sat = 80%, PA sat = 51%, 50% 04/2012  PFT's >> severe obstructive lung disease with reversible component, restriction, severe diffusion defect consistent with emphysema 4/28  CTA Chest >> no PE, motion degraded, COPD with bronchitis & air trapping, stable RUL ground-glass nodule  4/29  LE Doppler >> negative for DVT   ASSESSMENT / PLAN:  Acute on Chronic Hypercarbic / Hypoxic Respiratory Failure - in setting of severe obstructive disease.  ABG reviewed, likely not far from baseline  COPD with acute exacerbation - severe obstructive disease, former tobacco abuse, with reversible component on PFT's in 2013, on Brio, Daliresp, Spiriva, PRN albuterol at baseline. [facility] & was seen 2,02/2016 and declined due to BMI.  Clear CXR / no evidence of infiltrate.  Component of tracheomalacia on CT scan which appears to be new which could be contributing to mucus plugging.   Suspect COPD exacerbation with component of Cor Pulmonale.  Slow to resolve exacerbation.   Acute bronchitis with suspected mucous plugging  Plan: -Wean oxygen for sats 88-95%  -Continue Brovana, Pulmicort  -Duonebs Q4 for now -Continue solumedrol 60 mg IV Q12, will consider weaning in next 24-48 hours -Continue levaquin, recommend 5 days for AECOPD/acute bronchitis -Continue Mucinex -Continue hypertonic saline nebs, consider mucomyst nebs -Aggressive IS, FV -Mobilize as tolerated -Chest PT (vest) as tolerated  RUL Ground Glass Nodule - stable appearance on CT  Plan: -Will need outpatient follow up with pulmonary / serial monitoring of nodule   OSA   Plan: -Continue nocturnal BiPAP with bleed in O2   Mild PAH - based on RHC 2017, on demadex at baseline, suspect dry weight is around 189-190 Cor Pulmonale - suspect element of decompensation   Plan: -Continue  demadex -Monitor I/O's -Daily weights   Hx PE, DVT - in 2013, unclear etiology, Rx'd with coumadin from 4-05/2012  Canary BrimBrandi Ollis, NP-C  Pulmonary & Critical Care Pgr: [phone] or if no answer [phone] 04/07/2016, 12:23 PM  Attending Note:  40 year old female with O2 dependent COPD who presents with SOB. Hypercarbic and hypoxic respiratory failure. On exam, wheezing much improved this AM. I reviewed chest CT myself, areas of air trapping and a stable RUL GGO nodule. Discussed with PCCM-NP  Acute on chronic respiratory failure: - BiPAP QHS. - Needs activity and wt loss.  COPD: - Steroids. - Inhalers as above. - Pulmonary hygienes. - Mobilize.  RUL GGO: - CT of the chest in 1 year.  Pulmonary HTN: - Demedex. - Wt loss. - Keep dry.  OSA: - Nocturnal BiPAP.  PCCM will follow.  Patient seen and examined, agree with above note. I dictated the care and orders written for this patient under my direction.  Alyson ReedyWesam G Covey Baller, MD  [phone] 

## 2016-04-07 NOTE — Progress Notes (Signed)
Telemetry reflects false V. Fib while patient receives chest physiotherapy (which lasts for about 8-10 minutes at a time). Will pass on to day shift RN and continue to monitor.

## 2016-04-07 NOTE — Progress Notes (Signed)
SATURATION QUALIFICATIONS: (This note is used to comply with regulatory documentation for home oxygen)  Patient Saturations on Room Air at Rest = 83%  Patient Saturations on Room Air while Ambulating = Did not test ambulation on RA as sats at rest low%  Patient Saturations on 4 Liters of oxygen while Ambulating = 88-95% with pursed lip breathing and standing rest breaks.    Please briefly explain why patient needs home oxygen:Pt desats at rest and needs 4LO2 to keep  sats >88%.  Thanks.  [facility] Union CityDawn Cataleia Gade,PT Acute Rehabilitation [phone] (pager)

## 2016-04-07 NOTE — Progress Notes (Signed)
[facility]                                                                              Patient Demographics  Mary [name], is a 40 y.o. female, DOB - [birth year], ZOX:[id number]  Admit date - 04/01/2016   Admitting Physician Clydie Braun, MD  Outpatient Primary MD for the patient is Elvina Sidle, MD  Outpatient specialists: Dr. Gala Romney (cardiology)                                           Dr. Marchelle Gearing, pulmonology  LOS - 5  days    Chief Complaint  Patient presents with  . Shortness of Breath       Brief summary   Mary [name] is a 40 y.o. female with medical history significant of COPD oxygen dependent at 3 L, remote tobacco use, DVT/PE, PAH, and cor pulmonale; who presents with shortness of breath and productive cough. Symptoms started 2-3 days ago with increasing cough with clear sputum production.  Further workup was consistent with COPD exacerbation/bronchitis along with acute on chronic diastolic CHF.   Assessment & Plan     Acute On chronic hypoxic And hypercarbic respiratory failure : Multifactorial due to diastolic CHF, severe COPD, pulmonary hypertension, OSA/OHS  COPD exacerbation- improving - she uses BiPAP QHS,  -  Appreciate pulmonology conditions, continue IV steroids, placed on Brovana, Pulmicort, Levaquin, DuoNeb nebs every 4 hours -  Flutter valve, chest physiotherapy, Mucinex,  Acute on chronic diastolic CHF exacerbation  - Patient was placed on IV diuresis, negative balance of 6.7 L, also received oral  Zaroxolyn on 5/1 - Weight down from 198 lbs -> 190  - She is on oral torsemide at home 20 mg twice a day however she takes only once a day (feels a dizzy with the twice a day ). Restarted oral torsemide  Obstructive sleep apnea and morbid obesity.  -  counseled on weight loss, continue BiPAP daily qhs  Hypokalemia.  -  Resolved   Mildly elevated d-dimer on admission.  - CT angiogram chest negative, negative lower  extremity venous ultrasound.  Type 2 diabetes mellitus - A1c 5.9, hyperglycemia worse due to steroids. - Tapering IV steroids today, increase Lantus to 18 units twice a day, resistance sliding-scale, meal coverage   Code Status:full CODE STATUS   Family Communication: Discussed in detail with the patient, all imaging results, lab results explained to the patient  Disposition Plan:  Time Spent in minutes 25 minutes  Procedures  CTA chest  Consults   pulmonology  DVT Prophylaxis  Lovenox   Medications  Scheduled Meds: . arformoterol  15 mcg Nebulization BID  . benzonatate  200 mg Oral TID  . budesonide (PULMICORT) nebulizer solution  0.5 mg Nebulization BID  . enoxaparin (LOVENOX) injection  40 mg Subcutaneous Q24H  . guaiFENesin  600 mg Oral BID  . insulin aspart  0-20 Units Subcutaneous TID WC  . insulin aspart  0-5 Units Subcutaneous QHS  . insulin aspart  5 Units Subcutaneous  TID WC  . insulin glargine  15 Units Subcutaneous BID  . ipratropium-albuterol  3 mL Nebulization Q4H  . levofloxacin  500 mg Oral Daily  . methylPREDNISolone (SOLU-MEDROL) injection  60 mg Intravenous Q12H  . pantoprazole  40 mg Oral BID AC  . potassium chloride SA  40 mEq Oral Daily  . roflumilast  500 mcg Oral Daily  . sodium chloride HYPERTONIC  5 mL Nebulization BID  . torsemide  20 mg Oral Daily   Continuous Infusions:  PRN Meds:.acetaminophen **OR** [DISCONTINUED] acetaminophen, albuterol, [DISCONTINUED] ondansetron **OR** ondansetron (ZOFRAN) IV, polyethylene glycol   Antibiotics   Anti-infectives    Start     Dose/Rate Route Frequency Ordered Stop   04/07/16 1000  levofloxacin (LEVAQUIN) tablet 500 mg     500 mg Oral Daily 04/06/16 1025     04/05/16 1000  levofloxacin (LEVAQUIN) IVPB 500 mg  Status:  Discontinued     500 mg 100 mL/hr over 60 Minutes Intravenous Every 24 hours 04/05/16 0902 04/06/16 1025   04/03/16 0900  azithromycin (ZITHROMAX) tablet 500 mg  Status:   Discontinued     500 mg Oral Daily 04/03/16 0845 04/05/16 0902        Subjective:   Mary [name] was seen and examined today. States overnight had lot of cough and did not get good sleep. CBGs were still elevated. Otherwise overall has started to feel somewhat better now. Still hypoxic on minimal exertion.  No chest pain. No fevers or chills. Patient denies dizziness, chest pain, abdominal pain, N/V/D/C, new weakness, numbess, tingling.   Objective:   Filed Vitals:   04/07/16 0925 04/07/16 0927 04/07/16 0930 04/07/16 0935  BP:      Pulse:      Temp:      TempSrc:      Resp:      Height:      Weight:      SpO2: 93% 92% 93% 94%    Intake/Output Summary (Last 24 hours) at 04/07/16 1112 Last data filed at 04/07/16 0825  Gross per 24 hour  Intake    700 ml  Output   2550 ml  Net  -1850 ml     Wt Readings from Last 3 Encounters:  04/07/16 86.274 kg (190 lb 3.2 oz)  01/22/16 91.853 kg (202 lb 8 oz)  01/21/16 90.81 kg (200 lb 3.2 oz)     Exam  General: Alert and oriented x 3  HEENT:    Neck:   CVS: S1 S2 Clear, RRR  Respiratory: Diffuse wheezing  Abdomen: morbidly obese, Soft, nontender, nondistended, + bowel sounds  Ext: no cyanosis clubbing, trace edema  Neuro: no new deficits  Skin: No rashes  Psych: Normal affect and demeanor, alert and oriented x3    Data Reviewed:  I have personally reviewed following labs and imaging studies  Micro Results No results found for this or any previous visit (from the past 240 hour(s)).  Radiology Reports Dg Chest 2 View  04/05/2016  CLINICAL DATA:  Shortness Breath EXAM: CHEST  2 VIEW COMPARISON:  04/04/2016 FINDINGS: The heart size and mediastinal contours are within normal limits. Both lungs are mildly hyperinflated. The visualized skeletal structures are unremarkable. IMPRESSION: COPD without acute abnormality. Electronically Signed   By: Alcide Clever M.D.   On: 04/05/2016 07:47   Dg Chest 2 View  04/01/2016   CLINICAL DATA:  Shortness of breath for 3 days. EXAM: CHEST  2 VIEW COMPARISON:  01/03/2016. FINDINGS: Trachea is midline.  Heart size stable. Probable mild linear scarring at the left lung base. No airspace consolidation or pleural fluid. IMPRESSION: No acute findings. Electronically Signed   By: Leanna Battles M.D.   On: 04/01/2016 18:12   Ct Angio Chest Pe W/cm &/or Wo Cm  04/02/2016  CLINICAL DATA:  Worsening shortness of breath.  History of PE EXAM: CT ANGIOGRAPHY CHEST WITH CONTRAST TECHNIQUE: Multidetector CT imaging of the chest was performed using the standard protocol during bolus administration of intravenous contrast. Multiplanar CT image reconstructions and MIPs were obtained to evaluate the vascular anatomy. CONTRAST:  100 cc Isovue 370 intravenous COMPARISON:  12/16/2015 FINDINGS: THORACIC INLET/BODY WALL: No acute abnormality. MEDIASTINUM: Normal heart size. No pericardial effusion. Negative aorta. CTA of the pulmonary arteries is limited by respiratory motion, best obtainable due to patient condition. No evidence of pulmonary embolism. Hilar nodal enlargement which is likely reactive. Dilated main pulmonary artery at 34 mm. Patient has known pulmonary hypertension. LUNG WINDOWS: Diffuse airway thickening. Tracheomalacia with flattening. Patchy air trapping with overinflated right lower lobe, chronic. Mild patchy atelectasis. There is no edema, consolidation, effusion, or pneumothorax. Stable ground-glass nodule in the right upper lobe measuring 18 mm, without solid nodule. UPPER ABDOMEN: No acute findings.  Hepatic steatosis. OSSEOUS: No acute fracture.  No suspicious lytic or blastic lesions. Review of the MIP images confirms the above findings. IMPRESSION: 1. No evidence of acute pulmonary embolism. Study degraded by unavoidable respiratory motion. 2. COPD with bronchitis and air trapping. 3. Stable right upper lobe ground-glass nodule which requires yearly CT surveillance. Electronically  Signed   By: Marnee Spring M.D.   On: 04/02/2016 05:32   Dg Chest Port 1 View  04/04/2016  CLINICAL DATA:  Hypoxia.  Productive cough. EXAM: PORTABLE CHEST 1 VIEW COMPARISON:  04/01/2016 FINDINGS: A single AP portable view of the chest demonstrates no focal airspace consolidation or alveolar edema. The lungs are grossly clear. There is no large effusion or pneumothorax. Cardiac and mediastinal contours appear unremarkable. No interval change. IMPRESSION: No active disease. Electronically Signed   By: Ellery Plunk M.D.   On: 04/04/2016 20:24    CBC  Recent Labs Lab 04/01/16 1750 04/02/16 0207 04/05/16 0346 04/06/16 0358 04/07/16 0243  WBC 7.1 6.9 17.3* 9.9 12.0*  HGB 15.1* 14.5 16.4* 14.9 15.3*  HCT 46.3* 44.5 50.6* 45.0 46.6*  PLT 155 140* 190 141* 171  MCV 100.0 100.2* 102.2* 98.7 100.6*  MCH 32.6 32.7 33.1 32.7 33.0  MCHC 32.6 32.6 32.4 33.1 32.8  RDW 13.1 13.1 12.9 12.3 12.3    Chemistries   Recent Labs Lab 04/02/16 0207 04/03/16 0454 04/04/16 0300 04/05/16 0346 04/06/16 0358 04/07/16 0243  NA 137  --  134* 135 131* 135  K 3.2* 3.4* 4.0 4.4 3.9 3.9  CL 95*  --  82* 81* 77* 82*  CO2 28  --  35* 40* 40* 38*  GLUCOSE 201*  --  399* 308* 344* 240*  BUN 6  --  32* 43* 35* 33*  CREATININE 0.75  --  0.87 0.98 0.79 0.80  CALCIUM 9.2  --  10.6* 9.9 10.1 9.9  MG  --  1.9  --   --   --   --    ------------------------------------------------------------------------------------------------------------------ estimated creatinine clearance is 98.3 mL/min (by C-G formula based on Cr of 0.8). ------------------------------------------------------------------------------------------------------------------  Recent Labs  04/04/16 1300 04/05/16 1318  HGBA1C 5.2 5.9*   ------------------------------------------------------------------------------------------------------------------ No results for input(s): CHOL, HDL, LDLCALC, TRIG, CHOLHDL, LDLDIRECT in the last  72  hours. ------------------------------------------------------------------------------------------------------------------ No results for input(s): TSH, T4TOTAL, T3FREE, THYROIDAB in the last 72 hours.  Invalid input(s): FREET3 ------------------------------------------------------------------------------------------------------------------ No results for input(s): VITAMINB12, FOLATE, FERRITIN, TIBC, IRON, RETICCTPCT in the last 72 hours.  Coagulation profile No results for input(s): INR, PROTIME in the last 168 hours.  No results for input(s): DDIMER in the last 72 hours.  Cardiac Enzymes No results for input(s): CKMB, TROPONINI, MYOGLOBIN in the last 168 hours.  Invalid input(s): CK ------------------------------------------------------------------------------------------------------------------ Invalid input(s): POCBNP   Recent Labs  04/05/16 2029 04/06/16 0613 04/06/16 1148 04/06/16 1707 04/06/16 2143 04/07/16 0540  GLUCAP 458* 356* 462* 293* 464* 248*     Mary [name] M.D. [facility] 04/07/2016, 11:12 AM  Pager: [phone] Between 7am to 7pm - call Pager - [phone]  After 7pm go to [email or web address] - password [email or web address]  Call night coverage person covering after 7pm

## 2016-04-07 NOTE — Progress Notes (Signed)
Physical Therapy Treatment Patient Details Name: Mary HakimMichelle L [name] MRN: [id number] DOB: 06/28/1976 Today's Date: 04/07/2016    History of Present Illness Patient is a 40 yo female admitted 04/01/16 with DOE, hypoxic resp failure.  Patient with COPD exacerbation, CHF.   PMH:  COPD on 3 liters O2 home, DVT/PE, PAH, cor pulmonale, CHF, obesity, OSA on BiPAP at night    PT Comments    Pt admitted with above diagnosis. Pt currently with functional limitations due to endurance deficits with pt desaturation without O2.  Pt requiring less O2 than last visit but still needing 4LO2 to ambulate.  Will continue and progress pt as able.  Pt will benefit from skilled PT to increase their independence and safety with mobility to allow discharge to the venue listed below.    Follow Up Recommendations  No PT follow up;Supervision for mobility/OOB     Equipment Recommendations   (home O2)    Recommendations for Other Services       Precautions / Restrictions Precautions Precautions: Fall Precaution Comments: On O2, monitor sats Restrictions Weight Bearing Restrictions: No    Mobility  Bed Mobility Overal bed mobility: Independent                Transfers Overall transfer level: Independent Equipment used: None                Ambulation/Gait Ambulation/Gait assistance: Independent Ambulation Distance (Feet): 250 Feet Assistive device: None Gait Pattern/deviations: Step-through pattern;Decreased stride length Gait velocity: decreased Gait velocity interpretation: Below normal speed for age/gender General Gait Details: Patient with slow steady gait.  Decreased speed to help minimize DOE which was 2/4-3/4.  Several  standing rest breaks due to SOB.   Educated pt in pursed lip breathing which did seem to keep sats >91%.  During gait with O2 at 4 l/min, O2 sats decreased to 88-95%.  O2 sats up to 92% with standing rest break and pursed-lip breathing.   Stairs             Wheelchair Mobility    Modified Rankin (Stroke Patients Only)       Balance Overall balance assessment: Independent                                  Cognition Arousal/Alertness: Awake/alert Behavior During Therapy: WFL for tasks assessed/performed Overall Cognitive Status: Within Functional Limits for tasks assessed                      Exercises      General Comments        Pertinent Vitals/Pain Pain Assessment: No/denies pain    SATURATION QUALIFICATIONS: (This note is used to comply with regulatory documentation for home oxygen)  Patient Saturations on Room Air at Rest = 83%  Patient Saturations on Room Air while Ambulating = Did not test ambulation on RA as sats at rest low%  Patient Saturations on 4 Liters of oxygen while Ambulating = 88-95% with pursed lip breathing and standing rest breaks.    Please briefly explain why patient needs home oxygen:Pt desats at rest and needs 4LO2 to keep  sats >88%.    Home Living                      Prior Function            PT Goals (current goals can now be found  in the care plan section) Progress towards PT goals: Progressing toward goals    Frequency  Min 3X/week    PT Plan Current plan remains appropriate    Co-evaluation             End of Session Equipment Utilized During Treatment: Gait belt;Oxygen Activity Tolerance: Patient limited by fatigue Patient left: in bed;with call bell/phone within reach     Time: 1126-1139 PT Time Calculation (min) (ACUTE ONLY): 13 min  Charges:  $Gait Training: 8-22 mins                    G Codes:      WhiteAmadeo Garnet 04/20/16, 1:59 PM  Jamyah Folk,PT Acute Rehabilitation [phone] (pager)

## 2016-04-08 LAB — BASIC METABOLIC PANEL
Anion gap: 13 (ref 5–15)
BUN: 27 mg/dL — AB (ref 6–20)
CO2: 40 mmol/L — ABNORMAL HIGH (ref 22–32)
Calcium: 9.8 mg/dL (ref 8.9–10.3)
Chloride: 81 mmol/L — ABNORMAL LOW (ref 101–111)
Creatinine, Ser: 0.85 mg/dL (ref 0.44–1.00)
Glucose, Bld: 283 mg/dL — ABNORMAL HIGH (ref 65–99)
POTASSIUM: 4.2 mmol/L (ref 3.5–5.1)
SODIUM: 134 mmol/L — AB (ref 135–145)

## 2016-04-08 LAB — GLUCOSE, CAPILLARY
GLUCOSE-CAPILLARY: 304 mg/dL — AB (ref 65–99)
GLUCOSE-CAPILLARY: 196 mg/dL — AB (ref 65–99)
GLUCOSE-CAPILLARY: 287 mg/dL — AB (ref 65–99)
Glucose-Capillary: 354 mg/dL — ABNORMAL HIGH (ref 65–99)

## 2016-04-08 LAB — CBC
HCT: 47.5 % — ABNORMAL HIGH (ref 36.0–46.0)
HEMOGLOBIN: 15.5 g/dL — AB (ref 12.0–15.0)
MCH: 33.3 pg (ref 26.0–34.0)
MCHC: 32.6 g/dL (ref 30.0–36.0)
MCV: 101.9 fL — ABNORMAL HIGH (ref 78.0–100.0)
PLATELETS: 193 10*3/uL (ref 150–400)
RBC: 4.66 MIL/uL (ref 3.87–5.11)
RDW: 12.4 % (ref 11.5–15.5)
WBC: 13.2 10*3/uL — ABNORMAL HIGH (ref 4.0–10.5)

## 2016-04-08 MED ORDER — INSULIN ASPART 100 UNIT/ML ~~LOC~~ SOLN
8.0000 [IU] | Freq: Three times a day (TID) | SUBCUTANEOUS | Status: DC
  Administered 2016-04-08 – 2016-04-09 (×4): 8 [IU] via SUBCUTANEOUS

## 2016-04-08 MED ORDER — METHYLPREDNISOLONE SODIUM SUCC 40 MG IJ SOLR
30.0000 mg | Freq: Two times a day (BID) | INTRAMUSCULAR | Status: DC
  Administered 2016-04-09 (×2): 30 mg via INTRAVENOUS
  Filled 2016-04-08 (×2): qty 1

## 2016-04-08 NOTE — Progress Notes (Signed)
Name: Mary HakimMichelle L Mastrogiovanni MRN: [id number] DOB: 05/16/1976    ADMISSION DATE:  04/01/2016 CONSULTATION DATE:  04/05/16  REFERRING MD :  Dr. Isidoro Donningai   CHIEF COMPLAINT:  SOB, productive cough, acute on chronic hypoxic / hypercarbic respiratory failure   HISTORY OF PRESENT ILLNESS:  40 y/o F, former smoker (quit 2013) with PMH of DVT of RUE, PE, 3L oxygen dependent COPD (severe obstructive lung disease with reversible component, restriction, severe diffusion defect consistent with emphysema), mild PH by RHC 01/2016, Cor Pulmonale, childhood lung "surgeries" (born premature) and OSA on BiPAP (reports compliance - "that is my pacifier" who [facility]27 with worsening SOB.   The patient reports she [facility].  Less than two months prior to admission, she was able to ambulate for a 6 minute walk test without O2.  She was told by the transplant team that she is not "sick enough" for transplant at this point.  In early January, her home pulmonary regimen was changed from Advair / Spiriva to SunocoBreo, Daliresp and Spiriva Respimat.  She reports she was feeling great - able to get out of the house, let her dogs out, shop for groceries, cook and perform ADL's without difficulty.  On Tues 4/25, she began feeling poorly with decreased energy and inability to perform ADL's prompting her to visit the ER.  The patient reports increased shortness of breath, cough with inability to produce sputum but feels she needs to cough up secretions, and significant desaturations.  She reports she is unable to identify what triggered the current episode.  She denies significant swelling / weight gain.   On admission, she was noted to have desaturations into the 80's on 4L.  The patient was [facility] per [facility].  She was treated with IV steroids, nebulized bronchodilators, IV levaquin and bipap support.  The patient had an elevated D-Dimer on admission and a CTA of the chest was assessed for PE  which was negative but demonstrated changes consistent with COPD with bronchitis & air trapping, stable RUL ground-glass nodule.  LE doppler's also assessed and negative for DVT.  The patient entered at 198 lbs and has been diuresed to 189 (net negative 4650ml).  Despite diuresis, she has been slow to recover with ongoing desaturations despite increased O2.    PCCM consulted for evaluation.   SUBJECTIVE: Pt reports breathing is improved but not to baseline.  She walked with 6L O2 earlier in am and sats dropped to 84%, with pursed lipped breathing stayed at 91% with ambulation.  Able to cough up some sputum - feels inhaled saline and chest vest are working  VITAL SIGNS: Temp:  [97.7 F (36.5 C)-97.9 F (36.6 C)] 97.7 F (36.5 C) (05/04 0558) Pulse Rate:  [67-84] 84 (05/04 1123) Resp:  [18-20] 18 (05/04 1123) BP: (96-119)/(56-74) 102/56 mmHg (05/04 1123) SpO2:  [90 %-94 %] 94 % (05/04 1134) Weight:  [85.639 kg (188 lb 12.8 oz)] 85.639 kg (188 lb 12.8 oz) (05/04 0558)  PHYSICAL EXAMINATION: General:  Chronically ill appearing female in NAD, sitting up in chair Neuro:  AAOx4, speech clear, MAE HEENT:  MM pink/moist, no evidence of thrush, hoarse voice  Cardiovascular:  s1s2 rrr, no m/r/g, tachy  Lungs:  Prolonged exp phase, improved wheezing, remains on 4L Abdomen:  Obese/soft, bsx4 active  Musculoskeletal:  No acute deformities  Skin:  Warm/dry, no edema, clubbing of nail beds noted   Recent Labs Lab 04/06/16 0358 04/07/16 0243  04/08/16 0404  NA 131* 135 134*  K 3.9 3.9 4.2  CL 77* 82* 81*  CO2 40* 38* 40*  BUN 35* 33* 27*  CREATININE 0.79 0.80 0.85  GLUCOSE 344* 240* 283*    Recent Labs Lab 04/06/16 0358 04/07/16 0243 04/08/16 0404  HGB 14.9 15.3* 15.5*  HCT 45.0 46.6* 47.5*  WBC 9.9 12.0* 13.2*  PLT 141* 171 193   No results found. SIGNIFICANT EVENTS  4/27  Admit with SOB 5/01  PCCM consulted   STUDIES:  01/08/16   RHC >> mild PAH with mildly elevated PVR,  normal PCWP.  RA = 11, RV = 41/4/8, PA = 45/16 (35), PCW = 11, Fick cardiac output/index = 4.5/2.4, PVR = 5.3 WU, FA sat = 80%, PA sat = 51%, 50% 04/2012  PFT's >> severe obstructive lung disease with reversible component, restriction, severe diffusion defect consistent with emphysema 4/28  CTA Chest >> no PE, motion degraded, COPD with bronchitis & air trapping, stable RUL ground-glass nodule  4/29  LE Doppler >> negative for DVT  I reviewed CXR myself, hyperinflation noted.  ASSESSMENT / PLAN:  Acute on Chronic Hypercarbic / Hypoxic Respiratory Failure - in setting of severe obstructive disease.  ABG reviewed, likely not far from baseline  COPD with acute exacerbation - severe obstructive disease, former tobacco abuse, with reversible component on PFT's in 2013, on Brio, Daliresp, Spiriva, PRN albuterol at baseline. Previously referred to [facility] lung [facility] program & was seen 2,02/2016 and declined due to BMI.  Clear CXR / no evidence of infiltrate.  Component of tracheomalacia on CT scan which appears to be new which could be contributing to mucus plugging.   Suspect COPD exacerbation with component of Cor Pulmonale.  Slow to resolve exacerbation.   Acute bronchitis with suspected mucous plugging  Plan: -Wean oxygen for sats 88-95%  -Continue Brovana, Pulmicort  -Duonebs Q4 for now -Decrease solumedrol to 30 mg IV q12 x2 doses then change to prednisone 40 mg PO daily after that with a slow taper. -Continue levaquin, recommend 5 days for AECOPD/acute bronchitis -Continue Mucinex -Continue hypertonic saline nebs, consider mucomyst nebs -Aggressive IS, FV -Mobilize as tolerated -Chest PT (vest) as tolerated  RUL Ground Glass Nodule - stable appearance on CT  Plan: -Will need outpatient follow up with pulmonary / serial monitoring of nodule   OSA   Plan: -Continue nocturnal BiPAP with bleed in O2   Mild PAH - based on R[facility] 2017, on demadex at baseline, suspect dry weight is  around 189-190 Cor Pulmonale - suspect element of decompensation   Plan: -Continue demadex -Monitor I/O's -Daily weights   Hx PE, DVT - in 2013, unclear etiology, Rx'd with coumadin from 4-05/2012  Discussed with PCCM-NP.  Alyson Reedy, M.D. [facility] Pulmonary/Critical Care Medicine. Pager: [phone]. After hours pager: [phone].

## 2016-04-08 NOTE — Progress Notes (Signed)
Spoke to patient concerning chest vest.. She said it is making her sides very tender and requested not to have administered tonight.

## 2016-04-08 NOTE — Progress Notes (Signed)
Pt a/o, no c/o pain, VSS, pt stable 

## 2016-04-08 NOTE — Progress Notes (Signed)
[facility]                                                                              Patient Demographics  Mary [name], is a 40 y.o. female, DOB - 1975-12-26, ZOX:[id number]  Admit date - 04/01/2016   Admitting Physician Clydie Braun, MD  Outpatient Primary MD for the patient is Elvina Sidle, MD  Outpatient specialists: Dr. Gala Romney (cardiology)                                           Dr. Marchelle Gearing, pulmonology  LOS - 6  days    Chief Complaint  Patient presents with  . Shortness of Breath       Brief summary   Mary [name] is a 39 y.o. female with medical history significant of Chronic respiratory failure, emphysema/COPD oxygen dependent at 3 L, remote tobacco use, DVT/PE, PAH, and cor pulmonale; who presents with shortness of breath and productive cough. Symptoms started 2-3 days ago with increasing cough with clear sputum production.  Further workup was consistent with COPD exacerbation/bronchitis along with acute on chronic diastolic CHF.   Patient has slow and progressive improvement, pulmonology was consulted due to severe emphysema with mucous plugging.  Hopefully DC in next 2 days.   Assessment & Plan     Acute On chronic hypoxic And hypercarbic respiratory failure : Multifactorial due to diastolic CHF, severe COPD, pulmonary hypertension, OSA/OHS  COPD exacerbation- now improving - she uses BiPAP QHS,  -  Appreciate pulmonology 's recommendations, continue IV steroids, placed on Brovana, Pulmicort, Levaquin, DuoNeb nebs every 4 hours -  Flutter valve, chest physiotherapy, Mucinex,  Acute on chronic diastolic CHF exacerbation  - Patient was placed on IV diuresis, negative balance of 7.7 L, also received oral  Zaroxolyn on 5/1 - Weight down from 198 lbs -> 188  - She is on oral torsemide at home 20 mg twice a day however she takes only once a day (feels a dizzy with the twice a day ). Restarted oral torsemide  Obstructive  sleep apnea and morbid obesity.  -  counseled on weight loss, continue BiPAP daily qhs  Hypokalemia.  -  Resolved   Mildly elevated d-dimer on admission.  - CT angiogram chest negative, negative lower extremity venous ultrasound.  Type 2 diabetes mellitus - A1c 5.9, hyperglycemia worse due to steroids. - Tapering IV steroids, increase Lantus to 18 units twice a day, resistance sliding-scale, inc meal coverage to 8units TID  Code Status:full CODE STATUS   Family Communication: Discussed in detail with the patient, all imaging results, lab results explained to the patient  Disposition Plan:  Time Spent in minutes 25 minutes  Procedures  CTA chest  Consults   pulmonology  DVT Prophylaxis  Lovenox   Medications  Scheduled Meds: . arformoterol  15 mcg Nebulization BID  . benzonatate  200 mg Oral TID  . budesonide (PULMICORT) nebulizer solution  0.5 mg Nebulization BID  . enoxaparin (LOVENOX) injection  40 mg Subcutaneous Q24H  . guaiFENesin  600 mg Oral BID  . insulin aspart  0-20 Units Subcutaneous TID WC  . insulin aspart  0-5 Units Subcutaneous QHS  . insulin aspart  5 Units Subcutaneous TID WC  . insulin glargine  18 Units Subcutaneous BID  . ipratropium-albuterol  3 mL Nebulization Q4H  . levofloxacin  500 mg Oral Daily  . methylPREDNISolone (SOLU-MEDROL) injection  60 mg Intravenous Q12H  . pantoprazole  40 mg Oral BID AC  . potassium chloride SA  40 mEq Oral Daily  . roflumilast  500 mcg Oral Daily  . sodium chloride HYPERTONIC  5 mL Nebulization BID  . torsemide  20 mg Oral Daily   Continuous Infusions:  PRN Meds:.acetaminophen **OR** [DISCONTINUED] acetaminophen, albuterol, [DISCONTINUED] ondansetron **OR** ondansetron (ZOFRAN) IV, polyethylene glycol, sodium chloride   Antibiotics   Anti-infectives    Start     Dose/Rate Route Frequency Ordered Stop   04/07/16 1000  levofloxacin (LEVAQUIN) tablet 500 mg     500 mg Oral Daily 04/06/16 1025     04/05/16  1000  levofloxacin (LEVAQUIN) IVPB 500 mg  Status:  Discontinued     500 mg 100 mL/hr over 60 Minutes Intravenous Every 24 hours 04/05/16 0902 04/06/16 1025   04/03/16 0900  azithromycin (ZITHROMAX) tablet 500 mg  Status:  Discontinued     500 mg Oral Daily 04/03/16 0845 04/05/16 0902        Subjective:   Delvina Mizzell was seen and examined today.Overall starting to feel better. States hypertonic nebulized saline  solution  and chest physiotherapy has been helping. Slept better. Still hypoxic on minimal exertion.  No chest pain. No fevers or chills. Patient denies dizziness, chest pain, abdominal pain, N/V/D/C, new weakness, numbess, tingling.   Objective:   Filed Vitals:   04/07/16 2114 04/08/16 0440 04/08/16 0558 04/08/16 0813  BP: 119/74  96/61   Pulse: 82  67   Temp: 97.9 F (36.6 C)  97.7 F (36.5 C)   TempSrc: Oral  Oral   Resp: 20  18   Height:      Weight:   85.639 kg (188 lb 12.8 oz)   SpO2: 93% 92% 90% 92%    Intake/Output Summary (Last 24 hours) at 04/08/16 1123 Last data filed at 04/08/16 1009  Gross per 24 hour  Intake   1075 ml  Output   1900 ml  Net   -825 ml     Wt Readings from Last 3 Encounters:  04/08/16 85.639 kg (188 lb 12.8 oz)  01/22/16 91.853 kg (202 lb 8 oz)  01/21/16 90.81 kg (200 lb 3.2 oz)     Exam  General: Alert and oriented x 3  HEENT:    Neck:   CVS: S1 S2 Clear, RRR  Respiratory: Diffuse Expiratory wheezing improving    Abdomen: morbidly obese, Soft, nontender, nondistended, + bowel sounds  Ext: no cyanosis clubbing, trace edema  Neuro: no new deficits  Skin: No rashes  Psych: Normal affect and demeanor, alert and oriented x3    Data Reviewed:  I have personally reviewed following labs and imaging studies  Micro Results No results found for this or any previous visit (from the past 240 hour(s)).  Radiology Reports Dg Chest 2 View  04/05/2016  CLINICAL DATA:  Shortness Breath EXAM: CHEST  2 VIEW COMPARISON:   04/04/2016 FINDINGS: The heart size and mediastinal contours are within normal limits. Both lungs are mildly hyperinflated. The visualized skeletal structures are unremarkable. IMPRESSION: COPD without acute abnormality. Electronically Signed  By: Alcide Clever M.D.   On: 04/05/2016 07:47   Dg Chest 2 View  04/01/2016  CLINICAL DATA:  Shortness of breath for 3 days. EXAM: CHEST  2 VIEW COMPARISON:  01/03/2016. FINDINGS: Trachea is midline. Heart size stable. Probable mild linear scarring at the left lung base. No airspace consolidation or pleural fluid. IMPRESSION: No acute findings. Electronically Signed   By: Leanna Battles M.D.   On: 04/01/2016 18:12   Ct Angio Chest Pe W/cm &/or Wo Cm  04/02/2016  CLINICAL DATA:  Worsening shortness of breath.  History of PE EXAM: CT ANGIOGRAPHY CHEST WITH CONTRAST TECHNIQUE: Multidetector CT imaging of the chest was performed using the standard protocol during bolus administration of intravenous contrast. Multiplanar CT image reconstructions and MIPs were obtained to evaluate the vascular anatomy. CONTRAST:  100 cc Isovue 370 intravenous COMPARISON:  12/16/2015 FINDINGS: THORACIC INLET/BODY WALL: No acute abnormality. MEDIASTINUM: Normal heart size. No pericardial effusion. Negative aorta. CTA of the pulmonary arteries is limited by respiratory motion, best obtainable due to patient condition. No evidence of pulmonary embolism. Hilar nodal enlargement which is likely reactive. Dilated main pulmonary artery at 34 mm. Patient has known pulmonary hypertension. LUNG WINDOWS: Diffuse airway thickening. Tracheomalacia with flattening. Patchy air trapping with overinflated right lower lobe, chronic. Mild patchy atelectasis. There is no edema, consolidation, effusion, or pneumothorax. Stable ground-glass nodule in the right upper lobe measuring 18 mm, without solid nodule. UPPER ABDOMEN: No acute findings.  Hepatic steatosis. OSSEOUS: No acute fracture.  No suspicious lytic or  blastic lesions. Review of the MIP images confirms the above findings. IMPRESSION: 1. No evidence of acute pulmonary embolism. Study degraded by unavoidable respiratory motion. 2. COPD with bronchitis and air trapping. 3. Stable right upper lobe ground-glass nodule which requires yearly CT surveillance. Electronically Signed   By: Marnee Spring M.D.   On: 04/02/2016 05:32   Dg Chest Port 1 View  04/04/2016  CLINICAL DATA:  Hypoxia.  Productive cough. EXAM: PORTABLE CHEST 1 VIEW COMPARISON:  04/01/2016 FINDINGS: A single AP portable view of the chest demonstrates no focal airspace consolidation or alveolar edema. The lungs are grossly clear. There is no large effusion or pneumothorax. Cardiac and mediastinal contours appear unremarkable. No interval change. IMPRESSION: No active disease. Electronically Signed   By: Ellery Plunk M.D.   On: 04/04/2016 20:24    CBC  Recent Labs Lab 04/02/16 0207 04/05/16 0346 04/06/16 0358 04/07/16 0243 04/08/16 0404  WBC 6.9 17.3* 9.9 12.0* 13.2*  HGB 14.5 16.4* 14.9 15.3* 15.5*  HCT 44.5 50.6* 45.0 46.6* 47.5*  PLT 140* 190 141* 171 193  MCV 100.2* 102.2* 98.7 100.6* 101.9*  MCH 32.7 33.1 32.7 33.0 33.3  MCHC 32.6 32.4 33.1 32.8 32.6  RDW 13.1 12.9 12.3 12.3 12.4    Chemistries   Recent Labs Lab 04/03/16 0454 04/04/16 0300 04/05/16 0346 04/06/16 0358 04/07/16 0243 04/08/16 0404  NA  --  134* 135 131* 135 134*  K 3.4* 4.0 4.4 3.9 3.9 4.2  CL  --  82* 81* 77* 82* 81*  CO2  --  35* 40* 40* 38* 40*  GLUCOSE  --  399* 308* 344* 240* 283*  BUN  --  32* 43* 35* 33* 27*  CREATININE  --  0.87 0.98 0.79 0.80 0.85  CALCIUM  --  10.6* 9.9 10.1 9.9 9.8  MG 1.9  --   --   --   --   --    ------------------------------------------------------------------------------------------------------------------ estimated creatinine clearance  is 92.1 mL/min (by C-G formula based on Cr of  0.85). ------------------------------------------------------------------------------------------------------------------  Recent Labs  04/05/16 1318  HGBA1C 5.9*   ------------------------------------------------------------------------------------------------------------------ No results for input(s): CHOL, HDL, LDLCALC, TRIG, CHOLHDL, LDLDIRECT in the last 72 hours. ------------------------------------------------------------------------------------------------------------------ No results for input(s): TSH, T4TOTAL, T3FREE, THYROIDAB in the last 72 hours.  Invalid input(s): FREET3 ------------------------------------------------------------------------------------------------------------------ No results for input(s): VITAMINB12, FOLATE, FERRITIN, TIBC, IRON, RETICCTPCT in the last 72 hours.  Coagulation profile No results for input(s): INR, PROTIME in the last 168 hours.  No results for input(s): DDIMER in the last 72 hours.  Cardiac Enzymes No results for input(s): CKMB, TROPONINI, MYOGLOBIN in the last 168 hours.  Invalid input(s): CK ------------------------------------------------------------------------------------------------------------------ Invalid input(s): POCBNP   Recent Labs  04/06/16 2143 04/07/16 0540 04/07/16 1205 04/07/16 1656 04/07/16 2158 04/08/16 0623  GLUCAP 464* 248* 209* 216* 276* 304*     Amarea Macdowell M.D. [facility] 04/08/2016, 11:23 AM  Pager: [phone] Between 7am to 7pm - call Pager - [phone]  After 7pm go to [email or web address] - password [email or web address]  Call night coverage person covering after 7pm

## 2016-04-08 NOTE — Progress Notes (Signed)
Pt self administered Cpap full face mask

## 2016-04-09 LAB — BASIC METABOLIC PANEL
Anion gap: 14 (ref 5–15)
BUN: 25 mg/dL — AB (ref 6–20)
CALCIUM: 9.7 mg/dL (ref 8.9–10.3)
CO2: 36 mmol/L — ABNORMAL HIGH (ref 22–32)
Chloride: 85 mmol/L — ABNORMAL LOW (ref 101–111)
Creatinine, Ser: 0.69 mg/dL (ref 0.44–1.00)
GFR calc Af Amer: >60 mL/min (ref >60)
GLUCOSE: 275 mg/dL — AB (ref 65–99)
Potassium: 4.2 mmol/L (ref 3.5–5.1)
SODIUM: 135 mmol/L (ref 135–145)

## 2016-04-09 LAB — GLUCOSE, CAPILLARY
GLUCOSE-CAPILLARY: 292 mg/dL — AB (ref 65–99)
Glucose-Capillary: 165 mg/dL — ABNORMAL HIGH (ref 65–99)
Glucose-Capillary: 291 mg/dL — ABNORMAL HIGH (ref 65–99)
Glucose-Capillary: 182 mg/dL — ABNORMAL HIGH (ref 65–99)

## 2016-04-09 LAB — CBC
HCT: 47.4 % — ABNORMAL HIGH (ref 36.0–46.0)
Hemoglobin: 15.5 g/dL — ABNORMAL HIGH (ref 12.0–15.0)
MCH: 33.1 pg (ref 26.0–34.0)
MCHC: 32.7 g/dL (ref 30.0–36.0)
MCV: 101.3 fL — AB (ref 78.0–100.0)
PLATELETS: 202 10*3/uL (ref 150–400)
RBC: 4.68 MIL/uL (ref 3.87–5.11)
RDW: 12.4 % (ref 11.5–15.5)
WBC: 13.2 10*3/uL — AB (ref 4.0–10.5)

## 2016-04-09 MED ORDER — INSULIN ASPART 100 UNIT/ML ~~LOC~~ SOLN
0.0000 [IU] | Freq: Three times a day (TID) | SUBCUTANEOUS | Status: DC
  Administered 2016-04-10: 2 [IU] via SUBCUTANEOUS
  Administered 2016-04-10: 11 [IU] via SUBCUTANEOUS
  Administered 2016-04-11: 8 [IU] via SUBCUTANEOUS

## 2016-04-09 MED ORDER — PREDNISONE 20 MG PO TABS
40.0000 mg | ORAL_TABLET | Freq: Every day | ORAL | Status: DC
  Administered 2016-04-10 – 2016-04-11 (×2): 40 mg via ORAL
  Filled 2016-04-09 (×2): qty 2

## 2016-04-09 MED ORDER — INSULIN ASPART 100 UNIT/ML ~~LOC~~ SOLN: 0.0000 [IU] | Freq: Every day | SUBCUTANEOUS | Status: DC

## 2016-04-09 MED ORDER — INSULIN GLARGINE 100 UNIT/ML ~~LOC~~ SOLN
20.0000 [IU] | Freq: Two times a day (BID) | SUBCUTANEOUS | Status: DC
  Administered 2016-04-09 – 2016-04-10 (×2): 20 [IU] via SUBCUTANEOUS
  Filled 2016-04-09 (×3): qty 0.2

## 2016-04-09 MED ORDER — INSULIN ASPART 100 UNIT/ML ~~LOC~~ SOLN
10.0000 [IU] | Freq: Three times a day (TID) | SUBCUTANEOUS | Status: DC
  Administered 2016-04-10 (×2): 10 [IU] via SUBCUTANEOUS

## 2016-04-09 NOTE — Progress Notes (Signed)
Pt self administered Bipap.. Tolerating well.

## 2016-04-09 NOTE — Progress Notes (Signed)
SATURATION QUALIFICATIONS: (This note is used to comply with regulatory documentation for home oxygen)  Patient Saturations on Room Air at Rest = 85-89%  Patient Saturations on Room Air while Ambulating = Not tested as pt needs 3.5 LO2 to keep sats >90% at rest  Patient Saturations on 4 Liters of oxygen while Ambulating = 88-94%  Please briefly explain why patient needs home oxygen:Pt requires O2 at rest and with ambulation to maintain sats >88%.  Thanks.  [facility] Suburban Medical CenterDawn Albeiro Trompeter,PT Acute Rehabilitation [phone] (pager)

## 2016-04-09 NOTE — Progress Notes (Addendum)
Physical Therapy Treatment and D/C Patient Details Name: Mary [name] MRN: [id number] DOB: 1976/05/16 Today's Date: 04/09/2016    History of Present Illness Patient is a 40 yo female admitted 04/01/16 with DOE, hypoxic resp failure.  Patient with COPD exacerbation, CHF.   PMH:  COPD on 3 liters O2 home, DVT/PE, PAH, cor pulmonale, CHF, obesity, OSA on BiPAP at night    PT Comments    Pt admitted with above diagnosis. Pt currently with functional limitations due to endurance deficits. Pt was able to ambulate with independence.  Still requires 4LO2 to ambulate and keep sats 88-94%.  Pt on 3.5 LO2 at rest to keep sats >90%.  No further PT needs as pt is independent with ambulation and met goal.  Will sign off.  Follow Up Recommendations  No PT follow up;Supervision for mobility/OOB     Equipment Recommendations   (home O2)    Recommendations for Other Services       Precautions / Restrictions Precautions Precautions: Fall Precaution Comments: On O2, monitor sats Restrictions Weight Bearing Restrictions: No    Mobility  Bed Mobility Overal bed mobility: Independent                Transfers Overall transfer level: Independent Equipment used: None                Ambulation/Gait Ambulation/Gait assistance: Independent Ambulation Distance (Feet): 300 Feet Assistive device: None Gait Pattern/deviations: Step-through pattern;Decreased stride length Gait velocity: decreased Gait velocity interpretation: Below normal speed for age/gender General Gait Details: Patient with slow steady gait.  Decreased speed to help minimize DOE which was 2/4-3/4.  Several  standing rest breaks due to SOB.   Educated pt in pursed lip breathing which did seem to keep sats >91%.  During gait with O2 at 4 l/min, O2 sats decreased to 88-95%.  O2 sats up to 92% with standing rest break and pursed-lip breathing.   Stairs            Wheelchair Mobility    Modified Rankin (Stroke  Patients Only)       Balance Overall balance assessment: Independent                                  Cognition Arousal/Alertness: Awake/alert Behavior During Therapy: WFL for tasks assessed/performed Overall Cognitive Status: Within Functional Limits for tasks assessed                      Exercises      General Comments        Pertinent Vitals/Pain Pain Assessment: No/denies pain   SATURATION QUALIFICATIONS: (This note is used to comply with regulatory documentation for home oxygen)  Patient Saturations on Room Air at Rest = 85-89%  Patient Saturations on Room Air while Ambulating = Not tested as pt needs 3.5 LO2 to keep sats >90% at rest  Patient Saturations on 4 Liters of oxygen while Ambulating = 88-94%  Please briefly explain why patient needs home oxygen:Pt requires O2 at rest and with ambulation to maintain sats >88%.  Home Living                      Prior Function            PT Goals (current goals can now be found in the care plan section) Progress towards PT goals: Progressing toward goals  Frequency  Min 3X/week    PT Plan Current plan remains appropriate    Co-evaluation             End of Session Equipment Utilized During Treatment: Gait belt;Oxygen Activity Tolerance: Patient limited by fatigue Patient left: in bed;with call bell/phone within reach     Time: 1006-1019 PT Time Calculation (min) (ACUTE ONLY): 13 min  Charges:  $Gait Training: 8-22 mins                    G Codes:      White[place]frey Pick 04/21/16, 11:06 AM  [facility] [phone] (pager)

## 2016-04-09 NOTE — Progress Notes (Signed)
[facility]                                                                              Patient Demographics  Mary [name], is a 40 y.o. female, DOB - February 25, 1976, BJY:[id number]  Admit date - 04/01/2016   Admitting Physician Clydie Braun, MD  Outpatient Primary MD for the patient is Elvina Sidle, MD  Outpatient specialists: Dr. Gala Romney (cardiology)                                           Dr. Marchelle Gearing, pulmonology  LOS - 7  days    Chief Complaint  Patient presents with  . Shortness of Breath       Brief summary   Mary [name] is a 40 y.o. female with medical history significant of Chronic respiratory failure, emphysema/COPD oxygen dependent at 3 L, remote tobacco use, DVT/PE, PAH, and cor pulmonale; who presents with shortness of breath and productive cough. Symptoms started 2-3 days ago with increasing cough with clear sputum production.  Further workup was consistent with COPD exacerbation/bronchitis along with acute on chronic diastolic CHF.   Patient has slow and progressive improvement, pulmonology was consulted due to severe emphysema with mucous plugging.    Assessment & Plan     Acute On chronic hypoxic And hypercarbic respiratory failure : Multifactorial due to diastolic CHF, severe COPD, pulmonary hypertension, OSA/OHS   COPD exacerbation- gradually improving -  Continue BiPAP QHS,  -  Appreciate pulmonology assistance -  Solumedrol completed today.  D/c solumedrol -  Start prednisone 40mg  daily tomorrow -  Continue Brovana, Pulmicort, Levaquin, DuoNeb nebs every 4 hours -  Flutter valve, chest physiotherapy, Mucinex  Acute on chronic diastolic CHF exacerbation  - Patient was placed on IV diuresis, negative balance of 7.7 L, also received oral  Zaroxolyn on 5/1 - Weight up slightly to 191-lbs, unclear how reliable -  Continue oral torsemide (takes once daily at home)  Obstructive sleep apnea and morbid obesity.  -   counseled on weight loss, continue BiPAP daily qhs  Hypokalemia.  -  Resolved   Mildly elevated d-dimer on admission.  - CT angiogram chest negative, negative lower extremity venous ultrasound.  Type 2 diabetes mellitus - A1c 5.9, hyperglycemia worse due to steroids. - Increase to Lantus to 20 units twice a day  -  Increase aspirin to 10 units with meals  -  Decrease to moderate dose sliding scale insulin with meals  Code Status:full CODE STATUS   Family Communication: Discussed in detail with the patient, all imaging results, lab results explained to the patient  Disposition Plan:  Anticipate possibly home tomorrow. She already has home oxygen set up  Time Spent in minutes 25 minutes  Procedures  CTA chest  Consults   pulmonology  DVT Prophylaxis  Lovenox   Medications  Scheduled Meds: . arformoterol  15 mcg Nebulization BID  . benzonatate  200 mg Oral TID  . budesonide (PULMICORT) nebulizer solution  0.5 mg Nebulization BID  . enoxaparin (LOVENOX)  injection  40 mg Subcutaneous Q24H  . guaiFENesin  600 mg Oral BID  . insulin aspart  0-20 Units Subcutaneous TID WC  . insulin aspart  0-5 Units Subcutaneous QHS  . insulin aspart  8 Units Subcutaneous TID WC  . insulin glargine  18 Units Subcutaneous BID  . ipratropium-albuterol  3 mL Nebulization Q4H  . levofloxacin  500 mg Oral Daily  . methylPREDNISolone (SOLU-MEDROL) injection  30 mg Intravenous Q12H  . pantoprazole  40 mg Oral BID AC  . potassium chloride SA  40 mEq Oral Daily  . roflumilast  500 mcg Oral Daily  . sodium chloride HYPERTONIC  5 mL Nebulization BID  . torsemide  20 mg Oral Daily   Continuous Infusions:  PRN Meds:.acetaminophen **OR** [DISCONTINUED] acetaminophen, albuterol, [DISCONTINUED] ondansetron **OR** ondansetron (ZOFRAN) IV, polyethylene glycol, sodium chloride   Antibiotics   Anti-infectives    Start     Dose/Rate Route Frequency Ordered Stop   04/07/16 1000  levofloxacin (LEVAQUIN)  tablet 500 mg     500 mg Oral Daily 04/06/16 1025     04/05/16 1000  levofloxacin (LEVAQUIN) IVPB 500 mg  Status:  Discontinued     500 mg 100 mL/hr over 60 Minutes Intravenous Every 24 hours 04/05/16 0902 04/06/16 1025   04/03/16 0900  azithromycin (ZITHROMAX) tablet 500 mg  Status:  Discontinued     500 mg Oral Daily 04/03/16 0845 04/05/16 0902        Subjective:   Mary [name] is feeling better. She is able to ambulate to and from the bathroom without becoming dyspneic. States hypertonic nebulized saline is helping. Chest PT is not helping much she thinks.   Objective:   Filed Vitals:   04/09/16 0819 04/09/16 1108 04/09/16 1216 04/09/16 1518  BP:   121/79   Pulse:   111   Temp:   98 F (36.7 C)   TempSrc:   Oral   Resp:   20   Height:      Weight:      SpO2: 93% 93% 91% 93%    Intake/Output Summary (Last 24 hours) at 04/09/16 1701 Last data filed at 04/09/16 1649  Gross per 24 hour  Intake   1230 ml  Output   3100 ml  Net  -1870 ml     Wt Readings from Last 3 Encounters:  04/09/16 86.955 kg (191 lb 11.2 oz)  01/22/16 91.853 kg (202 lb 8 oz)  01/21/16 90.81 kg (200 lb 3.2 oz)     Exam  General: Obese female, no acute distress Alert and oriented x 3  CVS: S1 S2 Clear, RRR  Respiratory: Diminished bilateral breath sounds, prolonged expiratory phase, full expiratory wheeze.    Abdomen: morbidly obese, Soft, nontender, nondistended, + bowel sounds  Ext: no cyanosis clubbing, trace edema  Neuro: no new deficits  Skin: No rashes  Psych: Normal affect and demeanor, alert and oriented x3    Data Reviewed:  I have personally reviewed following labs and imaging studies  Micro Results No results found for this or any previous visit (from the past 240 hour(s)).  Radiology Reports Dg Chest 2 View  04/05/2016  CLINICAL DATA:  Shortness Breath EXAM: CHEST  2 VIEW COMPARISON:  04/04/2016 FINDINGS: The heart size and mediastinal contours are within normal  limits. Both lungs are mildly hyperinflated. The visualized skeletal structures are unremarkable. IMPRESSION: COPD without acute abnormality. Electronically Signed   By: Alcide Clever M.D.   On: 04/05/2016 07:47  Dg Chest 2 View  04/01/2016  CLINICAL DATA:  Shortness of breath for 3 days. EXAM: CHEST  2 VIEW COMPARISON:  01/03/2016. FINDINGS: Trachea is midline. Heart size stable. Probable mild linear scarring at the left lung base. No airspace consolidation or pleural fluid. IMPRESSION: No acute findings. Electronically Signed   By: Leanna Battles M.D.   On: 04/01/2016 18:12   Ct Angio Chest Pe W/cm &/or Wo Cm  04/02/2016  CLINICAL DATA:  Worsening shortness of breath.  History of PE EXAM: CT ANGIOGRAPHY CHEST WITH CONTRAST TECHNIQUE: Multidetector CT imaging of the chest was performed using the standard protocol during bolus administration of intravenous contrast. Multiplanar CT image reconstructions and MIPs were obtained to evaluate the vascular anatomy. CONTRAST:  100 cc Isovue 370 intravenous COMPARISON:  12/16/2015 FINDINGS: THORACIC INLET/BODY WALL: No acute abnormality. MEDIASTINUM: Normal heart size. No pericardial effusion. Negative aorta. CTA of the pulmonary arteries is limited by respiratory motion, best obtainable due to patient condition. No evidence of pulmonary embolism. Hilar nodal enlargement which is likely reactive. Dilated main pulmonary artery at 34 mm. Patient has known pulmonary hypertension. LUNG WINDOWS: Diffuse airway thickening. Tracheomalacia with flattening. Patchy air trapping with overinflated right lower lobe, chronic. Mild patchy atelectasis. There is no edema, consolidation, effusion, or pneumothorax. Stable ground-glass nodule in the right upper lobe measuring 18 mm, without solid nodule. UPPER ABDOMEN: No acute findings.  Hepatic steatosis. OSSEOUS: No acute fracture.  No suspicious lytic or blastic lesions. Review of the MIP images confirms the above findings.  IMPRESSION: 1. No evidence of acute pulmonary embolism. Study degraded by unavoidable respiratory motion. 2. COPD with bronchitis and air trapping. 3. Stable right upper lobe ground-glass nodule which requires yearly CT surveillance. Electronically Signed   By: Marnee Spring M.D.   On: 04/02/2016 05:32   Dg Chest Port 1 View  04/04/2016  CLINICAL DATA:  Hypoxia.  Productive cough. EXAM: PORTABLE CHEST 1 VIEW COMPARISON:  04/01/2016 FINDINGS: A single AP portable view of the chest demonstrates no focal airspace consolidation or alveolar edema. The lungs are grossly clear. There is no large effusion or pneumothorax. Cardiac and mediastinal contours appear unremarkable. No interval change. IMPRESSION: No active disease. Electronically Signed   By: Ellery Plunk M.D.   On: 04/04/2016 20:24    CBC  Recent Labs Lab 04/05/16 0346 04/06/16 0358 04/07/16 0243 04/08/16 0404 04/09/16 0447  WBC 17.3* 9.9 12.0* 13.2* 13.2*  HGB 16.4* 14.9 15.3* 15.5* 15.5*  HCT 50.6* 45.0 46.6* 47.5* 47.4*  PLT 190 141* 171 193 202  MCV 102.2* 98.7 100.6* 101.9* 101.3*  MCH 33.1 32.7 33.0 33.3 33.1  MCHC 32.4 33.1 32.8 32.6 32.7  RDW 12.9 12.3 12.3 12.4 12.4    Chemistries   Recent Labs Lab 04/03/16 0454  04/05/16 0346 04/06/16 0358 04/07/16 0243 04/08/16 0404 04/09/16 0447  NA  --   < > 135 131* 135 134* 135  K 3.4*  < > 4.4 3.9 3.9 4.2 4.2  CL  --   < > 81* 77* 82* 81* 85*  CO2  --   < > 40* 40* 38* 40* 36*  GLUCOSE  --   < > 308* 344* 240* 283* 275*  BUN  --   < > 43* 35* 33* 27* 25*  CREATININE  --   < > 0.98 0.79 0.80 0.85 0.69  CALCIUM  --   < > 9.9 10.1 9.9 9.8 9.7  MG 1.9  --   --   --   --   --   --   < > =  values in this interval not displayed. ------------------------------------------------------------------------------------------------------------------ estimated creatinine clearance is 98.7 mL/min (by C-G formula based on Cr of  0.69). ------------------------------------------------------------------------------------------------------------------ No results for input(s): HGBA1C in the last 72 hours. ------------------------------------------------------------------------------------------------------------------ No results for input(s): CHOL, HDL, LDLCALC, TRIG, CHOLHDL, LDLDIRECT in the last 72 hours. ------------------------------------------------------------------------------------------------------------------ No results for input(s): TSH, T4TOTAL, T3FREE, THYROIDAB in the last 72 hours.  Invalid input(s): FREET3 ------------------------------------------------------------------------------------------------------------------ No results for input(s): VITAMINB12, FOLATE, FERRITIN, TIBC, IRON, RETICCTPCT in the last 72 hours.  Coagulation profile No results for input(s): INR, PROTIME in the last 168 hours.  No results for input(s): DDIMER in the last 72 hours.  Cardiac Enzymes No results for input(s): CKMB, TROPONINI, MYOGLOBIN in the last 168 hours.  Invalid input(s): CK ------------------------------------------------------------------------------------------------------------------ Invalid input(s): POCBNP   Recent Labs  04/08/16 1126 04/08/16 1701 04/08/16 2116 04/09/16 0633 04/09/16 1214 04/09/16 1649  GLUCAP 196* 287* 354* 292* 165* 291*     Faraz Ponciano M.D. [facility] 04/09/2016, 5:01 PM  Pager: [phone] Between 7am to 7pm - call Pager - [phone]  After 7pm go to [email or web address] - password [email or web address]  Call night coverage person covering after 7pm

## 2016-04-09 NOTE — Progress Notes (Signed)
Cardiac monitoring discontinuation orders written 04/09/16 @ 1709.  CCMD notified.

## 2016-04-09 NOTE — Progress Notes (Signed)
Name: Mary [name] MRN: [id number] DOB: 09/16/1976    ADMISSION DATE:  04/01/2016 CONSULTATION DATE:  04/05/16  REFERRING MD :  Dr. Isidoro Donningai   CHIEF COMPLAINT:  SOB, productive cough, acute on chronic hypoxic / hypercarbic respiratory failure   HISTORY OF PRESENT ILLNESS:  40 y/o F, former smoker (quit 2013) with PMH of DVT of RUE, PE, 3L oxygen dependent COPD (severe obstructive lung disease with reversible component, restriction, severe diffusion defect consistent with emphysema), mild PH by RHC 01/2016, Cor Pulmonale, childhood lung "surgeries" (born premature) and OSA on BiPAP (reports compliance - "that is my pacifier" who [facility] with worsening SOB.   The patient [facility].  Less than two months prior to admission, she was able to ambulate for a 6 minute walk test without O2.  She was told by the transplant team that she is not "sick enough" for transplant at this point.  In early January, her home pulmonary regimen was changed from Advair / Spiriva to SunocoBreo, Daliresp and Spiriva Respimat.  She reports she was feeling great - able to get out of the house, let her dogs out, shop for groceries, cook and perform ADL's without difficulty.  On Tues 4/25, she began feeling poorly with decreased energy and inability to perform ADL's prompting her to visit the ER.  The patient reports increased shortness of breath, cough with inability to produce sputum but feels she needs to cough up secretions, and significant desaturations.  She reports she is unable to identify what triggered the current episode.  She denies significant swelling / weight gain.   On admission, she was noted to have desaturations into the 80's on 4L.  [facility]  She was treated with IV steroids, nebulized bronchodilators, IV levaquin and bipap support.  The patient had an elevated D-Dimer on admission and a CTA of the chest was assessed for PE  which was negative but demonstrated changes consistent with COPD with bronchitis & air trapping, stable RUL ground-glass nodule.  LE doppler's also assessed and negative for DVT.  The patient entered at 198 lbs and has been diuresed to 189 (net negative 4650ml).  Despite diuresis, she has been slow to recover with ongoing desaturations despite increased O2.    PCCM consulted for evaluation.   SUBJECTIVE: No events overnight.  Feels better.  VITAL SIGNS: Temp:  [97.6 F (36.4 C)-97.8 F (36.6 C)] 97.6 F (36.4 C) (05/05 0516) Pulse Rate:  [76-89] 76 (05/05 0516) Resp:  [18-20] 20 (05/05 0516) BP: (102-125)/(56-74) 125/74 mmHg (05/05 0516) SpO2:  [93 %-95 %] 93 % (05/05 1108) FiO2 (%):  [36 %] 36 % (05/05 1108) Weight:  [86.955 kg (191 lb 11.2 oz)] 86.955 kg (191 lb 11.2 oz) (05/05 0516)  PHYSICAL EXAMINATION: General:  Chronically ill appearing female in NAD, sitting up in chair Neuro:  AAOx4, speech clear, MAE HEENT:  MM pink/moist, no evidence of thrush, hoarse voice  Cardiovascular:  s1s2 rrr, no m/r/g, tachy  Lungs:  Prolonged exp phase, improved wheezing, remains on 4L Abdomen:  Obese/soft, bsx4 active  Musculoskeletal:  No acute deformities  Skin:  Warm/dry, no edema, clubbing of nail beds noted   Recent Labs Lab 04/07/16 0243 04/08/16 0404 04/09/16 0447  NA 135 134* 135  K 3.9 4.2 4.2  CL 82* 81* 85*  CO2 38* 40* 36*  BUN 33* 27* 25*  CREATININE 0.80 0.85 0.69  GLUCOSE 240* 283* 275*    Recent Labs Lab 04/07/16 0243 04/08/16 0404 04/09/16 0447  HGB 15.3* 15.5* 15.5*  HCT 46.6* 47.5* 47.4*  WBC 12.0* 13.2* 13.2*  PLT 171 193 202   No results found. SIGNIFICANT EVENTS  4/27  Admit with SOB 5/01  PCCM consulted   STUDIES:  01/08/16   RHC >> mild PAH with mildly elevated PVR, normal PCWP.  RA = 11, RV = 41/4/8, PA = 45/16 (35), PCW = 11, Fick cardiac output/index = 4.5/2.4, PVR = 5.3 WU, FA sat = 80%, PA sat = 51%, 50% 04/2012  PFT's >> severe obstructive  lung disease with reversible component, restriction, severe diffusion defect consistent with emphysema 4/28  CTA Chest >> no PE, motion degraded, COPD with bronchitis & air trapping, stable RUL ground-glass nodule  4/29  LE Doppler >> negative for DVT  I reviewed CXR myself, hyperinflation noted.  ASSESSMENT / PLAN:  Acute on Chronic Hypercarbic / Hypoxic Respiratory Failure - in setting of severe obstructive disease.  ABG reviewed, likely not far from baseline  COPD with acute exacerbation - severe obstructive disease, former tobacco abuse, with reversible component on PFT's in 2013, on Brio, Daliresp, Spiriva, PRN albuterol at baseline. Previously referred to [facility] lung transplant program & was seen 2,02/2016 and declined due to BMI.  Clear CXR / no evidence of infiltrate.  Component of tracheomalacia on CT scan which appears to be new which could be contributing to mucus plugging.   Suspect COPD exacerbation with component of Cor Pulmonale.  Slow to resolve exacerbation.   Acute bronchitis with suspected mucous plugging  Plan: -Wean oxygen for sats 88-95%  -Continue Brovana, Pulmicort  -Duonebs Q4 for now -Decrease solumedrol to 30 mg IV q12 x2 doses then change to prednisone 40 mg PO daily after that with a slow taper. -Continue levaquin, recommend 5 days for AECOPD/acute bronchitis -Continue Mucinex -Continue hypertonic saline nebs, consider mucomyst nebs -Aggressive IS, FV -Mobilize as tolerated -Chest PT (vest) as tolerated  RUL Ground Glass Nodule - stable appearance on CT  Plan: -Will need outpatient follow up with pulmonary / serial monitoring of nodule   OSA   Plan: -Continue nocturnal BiPAP with bleed in O2   Mild PAH - based on [facility] 2017, on demadex at baseline, suspect dry weight is around 189-190 Cor Pulmonale - suspect element of decompensation   Plan: -Continue demadex -Monitor I/O's -Daily weights  Hx PE, DVT - in 2013, unclear etiology, Rx'd with coumadin  from 4-05/2012  Discussed with PCCM-NP.  Alyson Reedy, M.D. [facility] Pulmonary/Critical Care Medicine. Pager: [phone]. After hours pager: [phone].

## 2016-04-10 DIAGNOSIS — E669 Obesity, unspecified: Secondary | ICD-10-CM

## 2016-04-10 DIAGNOSIS — E1169 Type 2 diabetes mellitus with other specified complication: Secondary | ICD-10-CM

## 2016-04-10 LAB — GLUCOSE, CAPILLARY
GLUCOSE-CAPILLARY: 172 mg/dL — AB (ref 65–99)
Glucose-Capillary: 126 mg/dL — ABNORMAL HIGH (ref 65–99)
Glucose-Capillary: 347 mg/dL — ABNORMAL HIGH (ref 65–99)
Glucose-Capillary: 102 mg/dL — ABNORMAL HIGH (ref 65–99)

## 2016-04-10 MED ORDER — PANTOPRAZOLE SODIUM 40 MG PO TBEC
40.0000 mg | DELAYED_RELEASE_TABLET | Freq: Two times a day (BID) | ORAL | Status: DC
Start: 2016-04-10 — End: 2016-07-22

## 2016-04-10 MED ORDER — INSULIN ASPART 100 UNIT/ML ~~LOC~~ SOLN
8.0000 [IU] | Freq: Three times a day (TID) | SUBCUTANEOUS | Status: DC
  Administered 2016-04-10 – 2016-04-11 (×3): 8 [IU] via SUBCUTANEOUS

## 2016-04-10 MED ORDER — INSULIN GLARGINE 100 UNIT/ML ~~LOC~~ SOLN
15.0000 [IU] | Freq: Two times a day (BID) | SUBCUTANEOUS | Status: DC
  Administered 2016-04-10 – 2016-04-11 (×2): 15 [IU] via SUBCUTANEOUS
  Filled 2016-04-10 (×3): qty 0.15

## 2016-04-10 MED ORDER — INSULIN ASPART 100 UNIT/ML ~~LOC~~ SOLN: 8.0000 [IU] | Freq: Three times a day (TID) | SUBCUTANEOUS | Status: DC

## 2016-04-10 MED ORDER — PREDNISONE 10 MG PO TABS: ORAL_TABLET | ORAL | Status: DC

## 2016-04-10 NOTE — Discharge Summary (Addendum)
Physician Discharge Summary  Mary [name] IOM:[id number] DOB: 1976-12-03 DOA: 04/01/2016  PCP: Robyn Haber, MD  Admit date: 04/01/2016 Discharge date: 04/11/2016  Recommendations for Outpatient Follow-up:  1. Follow-up with pulmonology in 1-2 weeks 2. Follow up with primary care doctor for insulin management and follow up of foot pain in 1 week or sooner if needed  Discharge Diagnoses:  Principal Problem:   COPD exacerbation ([place]) Active Problems:   Chronic diastolic heart failure (HCC)   OSA treated with BiPAP   SOB (shortness of breath)   Diabetes mellitus type 2 in obese [facility])   Discharge Condition: Stable, improved  Diet recommendation:  Diabetic/healthy heart  Wt Readings from Last 3 Encounters:  04/11/16 86.2 kg (190 lb 0.6 oz)  01/22/16 91.853 kg (202 lb 8 oz)  01/21/16 90.81 kg (200 lb 3.2 oz)    History of present illness:   Mary [name] is a 40 y.o. 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  She presented with a 2-day history of progressive shortness of breath and productive cough.  She was admitted for COPD exacerbation/bronchitis along with acute on chronic diastolic CHF.    Hospital Course:   Acute on chronic hypoxic and hypercarbic respiratory failure secondary to a combination of COPD exacerbation and acute on chronic diastolic congestive heart failure.  She was initially treated with IV steroids, nebulizer treatments, IV Levaquin, and as needed BiPAP. She had an elevated d-dimer but CT angiogram of the chest demonstrated no evidence of pulmonary embolism. The CT confirmed COPD and bronchitis.  She was also diuresed several liters with IV Lasix, however, she had minimal improvement in her respiratory distress and oxygen  requirements. Pulmonology was consulted.  They continued provider and Pulmicort and DuoNeb's. They gradually tapered her IV steroids. They added hypertonic saline and chest PT.  She completed her course of levofloxacin. She was able to gradually transition over to prednisone 40 mg daily and she will continue to gradually taper her prednisone as an outpatient until she can follow-up with pulmonology in approximately 1-2 weeks.   She was also given a dose of Zaroxolyn on 5/1 to assist with her diuresis. She was gradually transition back to her home dose oral torsemide.  Obstructive sleep apnea morbid obesity. Counseled on weight loss. She should continue her BiPAP nightly.  Hypokalemia, resolved with oral supplementation of potassium.  Hyperglycemia due to steroids, hemoglobin A1c of 5.9.  Blood sugars have been in the 200s-300s while on insulin and she is at risk of DKA or hyperosmolar syndrome.  She learned how to check blood sugars and administer insulin and was able to demonstrate proficiency with the nursing staff.  I have started metformin, lantus and sliding scale insulin and she should have close follow up with her primary care doctor to assist with further management.    Bilateral burning foot pain.  Differential diagnosis includes gout, particularly because of her diuretic use, or neuropathy.  She responded well to gabapentin and I gave her a prescription for ultram for breakthrough pain.  Recommend follow up with her PCP for further evaluation and second opinion, particularly if her foot pain worsens.     Procedures  CTA chest  Consults  pulmonology  Discharge Exam: Filed Vitals:   04/11/16 0507 04/11/16 1300  BP: 104/65 133/75  Pulse:  88 103  Temp: 98.8 F (37.1 C) 97.6 F (36.4 C)  Resp: 18 18   Filed Vitals:   04/11/16 0507 04/11/16 0728 04/11/16 1137 04/11/16 1300  BP: 104/65   133/75  Pulse: 88   103  Temp: 98.8 F (37.1 C)   97.6 F (36.4 C)  TempSrc: Oral   Oral   Resp: 18   18  Height:      Weight: 86.2 kg (190 lb 0.6 oz)     SpO2: 96% 96% 95% 94%     General: Obese female, no acute distress Alert and oriented x 3  CVS: S1 S2 Clear, RRR  Respiratory:  Improved aeration at bases, prolonged expiratory phase, no focal rales or rhonchi  Ext: no cyanosis clubbing, no edema.  2+ pedal pulses, no MTP joint tenderness, negative squeeze test, no painful to palpation but feet continued to burn.   Neuro: no new deficits  Psych: Normal affect and demeanor, alert and oriented x3  Discharge Instructions      Discharge Instructions    Call MD for:  difficulty breathing, headache or visual disturbances    Complete by:  As directed      Call MD for:  extreme fatigue    Complete by:  As directed      Call MD for:  hives    Complete by:  As directed      Call MD for:  persistant dizziness or light-headedness    Complete by:  As directed      Call MD for:  persistant nausea and vomiting    Complete by:  As directed      Call MD for:  severe uncontrolled pain    Complete by:  As directed      Call MD for:  temperature >100.4    Complete by:  As directed      Diet - low sodium heart healthy    Complete by:  As directed      Diet Carb Modified    Complete by:  As directed      Discharge instructions    Complete by:  As directed   If your morning  If your morning blood sugar is less than 150, please reduce your morning glargine/lantus dose by 5 units and continue on the reduced dose.  Each time your morning blood sugar is less than 150, cut back your lantus by 5 units until you are completely off your lantus insulin.  For your aspart insulin, check your blood sugar before meals and if your blood sugar is more than 200, give yourself 8 units of insulin.  Please record your blood sugars, the time of day, and whether you gave yourself insulin and how much insulin.  Bring this information with you to your primary care doctor's office so they can review it.   This information will help them figure out what to do next with your insulin.     Increase activity slowly    Complete by:  As directed             Medication List    TAKE these medications        albuterol 108 (90 Base) MCG/ACT inhaler  Commonly known as:  PROVENTIL HFA;VENTOLIN HFA  Inhale 2 puffs into the lungs every 6 (six) hours as needed for wheezing or shortness of breath.     blood glucose meter kit and supplies Kit  Dispense based on patient and insurance preference. Use up to  four times daily as directed. (FOR ICD-9 250.00, 250.01).     fluticasone furoate-vilanterol 100-25 MCG/INH Aepb  Commonly known as:  BREO ELLIPTA  Inhale 1 puff into the lungs daily.     gabapentin 100 MG capsule  Commonly known as:  NEURONTIN  Take 1 tab three times daily x 1 day, then 2 tabs three times a day x 2 days, then 3 tabs three times a day.     insulin aspart 100 UNIT/ML injection  Commonly known as:  novoLOG  Inject 8 Units into the skin 3 (three) times daily before meals. Inject 8 Units three times daily with meals if premeal blood sugar is > 200.     insulin detemir 100 UNIT/ML injection  Commonly known as:  LEVEMIR  Inject 0.2 mLs (20 Units total) into the skin daily.     INSULIN SYRINGE .3CC/31GX5/16" 31G X 5/16" 0.3 ML Misc  Use as indicated four times daily     metFORMIN 500 MG tablet  Commonly known as:  GLUCOPHAGE  Take 1 tablet (500 mg total) by mouth daily with breakfast.     pantoprazole 40 MG tablet  Commonly known as:  PROTONIX  Take 1 tablet (40 mg total) by mouth 2 (two) times daily before a meal.     polyethylene glycol packet  Commonly known as:  MIRALAX / GLYCOLAX  Take 17 g by mouth daily as needed for mild constipation.     potassium chloride SA 20 MEQ tablet  Commonly known as:  K-DUR,KLOR-CON  Take 1 tablet (20 mEq total) by mouth daily.     predniSONE 10 MG tablet  Commonly known as:  DELTASONE  Take 4 tabs daily x 7 days, then 3 tabs daily x 7  days, 2 tabs daily x 7 days, 1 tab daily x 7 days     roflumilast 500 MCG Tabs tablet  Commonly known as:  DALIRESP  First week take 1 tablet every other day then 1 tablet by mouth daily     Tiotropium Bromide Monohydrate 1.25 MCG/ACT Aers  Commonly known as:  SPIRIVA RESPIMAT  Inhale 2 puffs into the lungs 2 (two) times daily.     torsemide 20 MG tablet  Commonly known as:  DEMADEX  Take 1 tablet (20 mg total) by mouth 2 (two) times daily.     traMADol 50 MG tablet  Commonly known as:  ULTRAM  Take 1 tablet (50 mg total) by mouth every 6 (six) hours as needed for severe pain.       Follow-up Information    Follow up with [facility].   Why:  Agency will contact you regarding services offered   Contact information:   [place] [id number] [phone]      Follow up with [facility], [place]. Schedule an appointment as soon as possible for a visit in 2 weeks.   Specialty:  Pulmonary Disease   Contact information:   [place]  [address] [phone]       Follow up with Robyn Haber, MD. Schedule an appointment as soon as possible for a visit in 1 week.   Specialty:  Family Medicine   Contact information:   [place] [address] [phone]        The results of significant diagnostics from this hospitalization (including imaging, microbiology, ancillary and laboratory) are listed below for reference.    Significant Diagnostic Studies: Dg Chest 2 View  04/05/2016  CLINICAL DATA:  Shortness  Breath EXAM: CHEST  2 VIEW COMPARISON:  04/04/2016 FINDINGS: The heart size and mediastinal contours are within normal limits. Both lungs are mildly hyperinflated. The visualized skeletal structures are unremarkable. IMPRESSION: COPD without acute abnormality. Electronically Signed   By: Inez Catalina M.D.   On: 04/05/2016 07:47   Dg Chest 2 View  04/01/2016  CLINICAL DATA:  Shortness of breath for 3 days. EXAM: CHEST  2 VIEW  COMPARISON:  01/03/2016. FINDINGS: Trachea is midline. Heart size stable. Probable mild linear scarring at the left lung base. No airspace consolidation or pleural fluid. IMPRESSION: No acute findings. Electronically Signed   By: Lorin Picket M.D.   On: 04/01/2016 18:12   Ct Angio Chest Pe W/cm &/or Wo Cm  04/02/2016  CLINICAL DATA:  Worsening shortness of breath.  History of PE EXAM: CT ANGIOGRAPHY CHEST WITH CONTRAST TECHNIQUE: Multidetector CT imaging of the chest was performed using the standard protocol during bolus administration of intravenous contrast. Multiplanar CT image reconstructions and MIPs were obtained to evaluate the vascular anatomy. CONTRAST:  100 cc Isovue 370 intravenous COMPARISON:  12/16/2015 FINDINGS: THORACIC INLET/BODY WALL: No acute abnormality. MEDIASTINUM: Normal heart size. No pericardial effusion. Negative aorta. CTA of the pulmonary arteries is limited by respiratory motion, best obtainable due to patient condition. No evidence of pulmonary embolism. Hilar nodal enlargement which is likely reactive. Dilated main pulmonary artery at 34 mm. Patient has known pulmonary hypertension. LUNG WINDOWS: Diffuse airway thickening. Tracheomalacia with flattening. Patchy air trapping with overinflated right lower lobe, chronic. Mild patchy atelectasis. There is no edema, consolidation, effusion, or pneumothorax. Stable ground-glass nodule in the right upper lobe measuring 18 mm, without solid nodule. UPPER ABDOMEN: No acute findings.  Hepatic steatosis. OSSEOUS: No acute fracture.  No suspicious lytic or blastic lesions. Review of the MIP images confirms the above findings. IMPRESSION: 1. No evidence of acute pulmonary embolism. Study degraded by unavoidable respiratory motion. 2. COPD with bronchitis and air trapping. 3. Stable right upper lobe ground-glass nodule which requires yearly CT surveillance. Electronically Signed   By: Monte Fantasia M.D.   On: 04/02/2016 05:32   Dg Chest  Port 1 View  04/04/2016  CLINICAL DATA:  Hypoxia.  Productive cough. EXAM: PORTABLE CHEST 1 VIEW COMPARISON:  04/01/2016 FINDINGS: A single AP portable view of the chest demonstrates no focal airspace consolidation or alveolar edema. The lungs are grossly clear. There is no large effusion or pneumothorax. Cardiac and mediastinal contours appear unremarkable. No interval change. IMPRESSION: No active disease. Electronically Signed   By: Andreas Newport M.D.   On: 04/04/2016 20:24    Microbiology: No results found for this or any previous visit (from the past 240 hour(s)).   Labs: Basic Metabolic Panel:  Recent Labs Lab 04/05/16 0346 04/06/16 0358 04/07/16 0243 04/08/16 0404 04/09/16 0447  NA 135 131* 135 134* 135  K 4.4 3.9 3.9 4.2 4.2  CL 81* 77* 82* 81* 85*  CO2 40* 40* 38* 40* 36*  GLUCOSE 308* 344* 240* 283* 275*  BUN 43* 35* 33* 27* 25*  CREATININE 0.98 0.79 0.80 0.85 0.69  CALCIUM 9.9 10.1 9.9 9.8 9.7   Liver Function Tests: No results for input(s): AST, ALT, ALKPHOS, BILITOT, PROT, ALBUMIN in the last 168 hours. No results for input(s): LIPASE, AMYLASE in the last 168 hours. No results for input(s): AMMONIA in the last 168 hours. CBC:  Recent Labs Lab 04/05/16 0346 04/06/16 0358 04/07/16 0243 04/08/16 0404 04/09/16 0447  WBC 17.3* 9.9 12.0* 13.2*  13.2*  HGB 16.4* 14.9 15.3* 15.5* 15.5*  HCT 50.6* 45.0 46.6* 47.5* 47.4*  MCV 102.2* 98.7 100.6* 101.9* 101.3*  PLT 190 141* 171 193 202   Cardiac Enzymes: No results for input(s): CKTOTAL, CKMB, CKMBINDEX, TROPONINI in the last 168 hours. BNP: BNP (last 3 results)  Recent Labs  12/19/15 0440 04/01/16 1750  BNP 192.9* 19.4    ProBNP (last 3 results) No results for input(s): PROBNP in the last 8760 hours.  CBG:  Recent Labs Lab 04/10/16 1219 04/10/16 1655 04/10/16 2137 04/11/16 0709 04/11/16 1147  GLUCAP 347* 102* 172* 118* 265*    Time coordinating discharge: 35 minutes  Signed:  Loria Lacina,  Artavius Stearns  [facility] 04/11/2016, 4:08 PM

## 2016-04-10 NOTE — Progress Notes (Signed)
[facility]                                                                              Patient Demographics  Mary [name], is a 40 y.o. female, DOB - 03/16/1976, ZOX:[id number]  Admit date - 04/01/2016   Admitting Physician Clydie Braunondell A Smith, MD  Outpatient Primary MD for the patient is Elvina SidleLAUENSTEIN,KURT, MD  Outpatient specialists: Dr. Gala RomneyBensimhon (cardiology)                                           Dr. Marchelle Gearingamaswamy, pulmonology  LOS - 8  days    Chief Complaint  Patient presents with  . Shortness of Breath       Brief summary   Mary [name] is a 40 y.o. female with medical history significant of Chronic respiratory failure, emphysema/COPD oxygen dependent at 3 L, remote tobacco use, DVT/PE, PAH, and cor pulmonale; who presents with shortness of breath and productive cough. Symptoms started 2-3 days ago with increasing cough with clear sputum production.  Further workup was consistent with COPD exacerbation/bronchitis along with acute on chronic diastolic CHF.   Patient has slow and progressive improvement, pulmonology was consulted due to severe emphysema with mucous plugging.    Assessment & Plan     Acute On chronic hypoxic And hypercarbic respiratory failure : Multifactorial due to diastolic CHF, severe COPD, pulmonary hypertension, OSA/OHS   COPD exacerbation- gradually improving -  Continue BiPAP QHS,  -  Appreciate pulmonology assistance -  Solumedrol completed today.  D/c solumedrol -  Start prednisone 40mg  daily tomorrow -  Continue Brovana, Pulmicort, Levaquin, DuoNeb nebs every 4 hours -  Flutter valve, chest physiotherapy, Mucinex  Acute on chronic diastolic CHF exacerbation  - Patient was placed on IV diuresis, negative balance of 7.7 L, also received oral  Zaroxolyn on 5/1 - Weight up slightly to 191-lbs, unclear how reliable -  Continue oral torsemide (takes once daily at home)  Obstructive sleep apnea and morbid obesity.  -   counseled on weight loss, continue BiPAP daily qhs  Hypokalemia.  -  Resolved   Mildly elevated d-dimer on admission.  - CT angiogram chest negative, negative lower extremity venous ultrasound.  T2DM due to medications/steroids, will need insulin for home to prevent DKA.  New diagnosis -  A1c 5.9 -  Decrease Lantus to 18 units twice a day  -  Decrease aspart to 8 units with meals  -  Continue moderate dose sliding scale insulin with meals  Code Status:full CODE STATUS   Family Communication: Discussed in detail with the patient, all imaging results, lab results explained to the patient  Disposition Plan:  Anticipate possibly home tomorrow. She already has home oxygen set up.  Needs teaching by nursing staff about check fingersticks and administering insulin.  Will need close follow up with PCP.    Time Spent in minutes 25 minutes  Procedures  CTA chest  Consults   pulmonology  DVT Prophylaxis  Lovenox   Medications  Scheduled Meds: . arformoterol  15 mcg Nebulization BID  . benzonatate  200 mg Oral TID  . budesonide (PULMICORT) nebulizer solution  0.5 mg Nebulization BID  . enoxaparin (LOVENOX) injection  40 mg Subcutaneous Q24H  . guaiFENesin  600 mg Oral BID  . insulin aspart  0-15 Units Subcutaneous TID WC  . insulin aspart  0-5 Units Subcutaneous QHS  . [START ON 04/11/2016] insulin aspart  8 Units Subcutaneous TID WC  . insulin glargine  15 Units Subcutaneous BID  . ipratropium-albuterol  3 mL Nebulization Q4H  . levofloxacin  500 mg Oral Daily  . pantoprazole  40 mg Oral BID AC  . potassium chloride SA  40 mEq Oral Daily  . predniSONE  40 mg Oral Q breakfast  . roflumilast  500 mcg Oral Daily  . sodium chloride HYPERTONIC  5 mL Nebulization BID  . torsemide  20 mg Oral Daily   Continuous Infusions:  PRN Meds:.acetaminophen **OR** [DISCONTINUED] acetaminophen, albuterol, [DISCONTINUED] ondansetron **OR** ondansetron (ZOFRAN) IV, polyethylene glycol, sodium  chloride   Antibiotics   Anti-infectives    Start     Dose/Rate Route Frequency Ordered Stop   04/07/16 1000  levofloxacin (LEVAQUIN) tablet 500 mg     500 mg Oral Daily 04/06/16 1025     04/05/16 1000  levofloxacin (LEVAQUIN) IVPB 500 mg  Status:  Discontinued     500 mg 100 mL/hr over 60 Minutes Intravenous Every 24 hours 04/05/16 0902 04/06/16 1025   04/03/16 0900  azithromycin (ZITHROMAX) tablet 500 mg  Status:  Discontinued     500 mg Oral Daily 04/03/16 0845 04/05/16 0902        Subjective:   Mary [name] is feeling better.  She would like to go home today.  Had coughing spell this afternoon but recovered.  Ambulating frequently.    Objective:   Filed Vitals:   04/10/16 0811 04/10/16 1134 04/10/16 1200 04/10/16 1614  BP:   122/80   Pulse:   107   Temp:   97.8 F (36.6 C)   TempSrc:   Oral   Resp:   22   Height:      Weight:      SpO2: 93% 95% 90% 94%    Intake/Output Summary (Last 24 hours) at 04/10/16 1700 Last data filed at 04/10/16 1000  Gross per 24 hour  Intake    720 ml  Output   1400 ml  Net   -680 ml     Wt Readings from Last 3 Encounters:  04/10/16 86.909 kg (191 lb 9.6 oz)  01/22/16 91.853 kg (202 lb 8 oz)  01/21/16 90.81 kg (200 lb 3.2 oz)     Exam  General: Obese female, no acute distress Alert and oriented x 3  CVS: S1 S2 Clear, RRR  Respiratory: Diminished bilateral breath sounds, prolonged expiratory phase, full expiratory wheeze, no focal rales or rhonchi  Ext: no cyanosis clubbing, noedema  Neuro: no new deficits  Psych: Normal affect and demeanor, alert and oriented x3   Data Reviewed:  I have personally reviewed following labs and imaging studies  Micro Results No results found for this or any previous visit (from the past 240 hour(s)).  Radiology Reports Dg Chest 2 View  04/05/2016  CLINICAL DATA:  Shortness Breath EXAM: CHEST  2 VIEW COMPARISON:  04/04/2016 FINDINGS: The heart size and mediastinal contours are  within normal limits. Both lungs are mildly hyperinflated. The visualized skeletal structures are unremarkable. IMPRESSION: COPD without acute abnormality. Electronically Signed   By:  Alcide Clever M.D.   On: 04/05/2016 07:47   Dg Chest 2 View  04/01/2016  CLINICAL DATA:  Shortness of breath for 3 days. EXAM: CHEST  2 VIEW COMPARISON:  01/03/2016. FINDINGS: Trachea is midline. Heart size stable. Probable mild linear scarring at the left lung base. No airspace consolidation or pleural fluid. IMPRESSION: No acute findings. Electronically Signed   By: Leanna Battles M.D.   On: 04/01/2016 18:12   Ct Angio Chest Pe W/cm &/or Wo Cm  04/02/2016  CLINICAL DATA:  Worsening shortness of breath.  History of PE EXAM: CT ANGIOGRAPHY CHEST WITH CONTRAST TECHNIQUE: Multidetector CT imaging of the chest was performed using the standard protocol during bolus administration of intravenous contrast. Multiplanar CT image reconstructions and MIPs were obtained to evaluate the vascular anatomy. CONTRAST:  100 cc Isovue 370 intravenous COMPARISON:  12/16/2015 FINDINGS: THORACIC INLET/BODY WALL: No acute abnormality. MEDIASTINUM: Normal heart size. No pericardial effusion. Negative aorta. CTA of the pulmonary arteries is limited by respiratory motion, best obtainable due to patient condition. No evidence of pulmonary embolism. Hilar nodal enlargement which is likely reactive. Dilated main pulmonary artery at 34 mm. Patient has known pulmonary hypertension. LUNG WINDOWS: Diffuse airway thickening. Tracheomalacia with flattening. Patchy air trapping with overinflated right lower lobe, chronic. Mild patchy atelectasis. There is no edema, consolidation, effusion, or pneumothorax. Stable ground-glass nodule in the right upper lobe measuring 18 mm, without solid nodule. UPPER ABDOMEN: No acute findings.  Hepatic steatosis. OSSEOUS: No acute fracture.  No suspicious lytic or blastic lesions. Review of the MIP images confirms the above  findings. IMPRESSION: 1. No evidence of acute pulmonary embolism. Study degraded by unavoidable respiratory motion. 2. COPD with bronchitis and air trapping. 3. Stable right upper lobe ground-glass nodule which requires yearly CT surveillance. Electronically Signed   By: Marnee Spring M.D.   On: 04/02/2016 05:32   Dg Chest Port 1 View  04/04/2016  CLINICAL DATA:  Hypoxia.  Productive cough. EXAM: PORTABLE CHEST 1 VIEW COMPARISON:  04/01/2016 FINDINGS: A single AP portable view of the chest demonstrates no focal airspace consolidation or alveolar edema. The lungs are grossly clear. There is no large effusion or pneumothorax. Cardiac and mediastinal contours appear unremarkable. No interval change. IMPRESSION: No active disease. Electronically Signed   By: Ellery Plunk M.D.   On: 04/04/2016 20:24    CBC  Recent Labs Lab 04/05/16 0346 04/06/16 0358 04/07/16 0243 04/08/16 0404 04/09/16 0447  WBC 17.3* 9.9 12.0* 13.2* 13.2*  HGB 16.4* 14.9 15.3* 15.5* 15.5*  HCT 50.6* 45.0 46.6* 47.5* 47.4*  PLT 190 141* 171 193 202  MCV 102.2* 98.7 100.6* 101.9* 101.3*  MCH 33.1 32.7 33.0 33.3 33.1  MCHC 32.4 33.1 32.8 32.6 32.7  RDW 12.9 12.3 12.3 12.4 12.4    Chemistries   Recent Labs Lab 04/05/16 0346 04/06/16 0358 04/07/16 0243 04/08/16 0404 04/09/16 0447  NA 135 131* 135 134* 135  K 4.4 3.9 3.9 4.2 4.2  CL 81* 77* 82* 81* 85*  CO2 40* 40* 38* 40* 36*  GLUCOSE 308* 344* 240* 283* 275*  BUN 43* 35* 33* 27* 25*  CREATININE 0.98 0.79 0.80 0.85 0.69  CALCIUM 9.9 10.1 9.9 9.8 9.7   ------------------------------------------------------------------------------------------------------------------ estimated creatinine clearance is 98.6 mL/min (by C-G formula based on Cr of 0.69). ------------------------------------------------------------------------------------------------------------------ No results for input(s): HGBA1C in the last 72  hours. ------------------------------------------------------------------------------------------------------------------ No results for input(s): CHOL, HDL, LDLCALC, TRIG, CHOLHDL, LDLDIRECT in the last 72 hours. ------------------------------------------------------------------------------------------------------------------ No results for  input(s): TSH, T4TOTAL, T3FREE, THYROIDAB in the last 72 hours.  Invalid input(s): FREET3 ------------------------------------------------------------------------------------------------------------------ No results for input(s): VITAMINB12, FOLATE, FERRITIN, TIBC, IRON, RETICCTPCT in the last 72 hours.  Coagulation profile No results for input(s): INR, PROTIME in the last 168 hours.  No results for input(s): DDIMER in the last 72 hours.  Cardiac Enzymes No results for input(s): CKMB, TROPONINI, MYOGLOBIN in the last 168 hours.  Invalid input(s): CK ------------------------------------------------------------------------------------------------------------------ Invalid input(s): POCBNP   Recent Labs  04/09/16 0633 04/09/16 1214 04/09/16 1649 04/09/16 2048 04/10/16 0616 04/10/16 1219  GLUCAP 292* 165* 291* 182* 126* 347*     Lexus Shampine M.D. [facility] 04/10/2016, 5:00 PM  Pager: [phone] Between 7am to 7pm - call Pager - [phone]  After 7pm go to [email or web address] - password [email or web address]  Call night coverage person covering after 7pm

## 2016-04-11 DIAGNOSIS — E669 Obesity, unspecified: Secondary | ICD-10-CM

## 2016-04-11 DIAGNOSIS — E119 Type 2 diabetes mellitus without complications: Secondary | ICD-10-CM

## 2016-04-11 LAB — GLUCOSE, CAPILLARY
Glucose-Capillary: 118 mg/dL — ABNORMAL HIGH (ref 65–99)
Glucose-Capillary: 265 mg/dL — ABNORMAL HIGH (ref 65–99)

## 2016-04-11 MED ORDER — GABAPENTIN 100 MG PO CAPS: ORAL_CAPSULE | ORAL | Status: DC

## 2016-04-11 MED ORDER — TRAMADOL HCL 50 MG PO TABS
50.0000 mg | ORAL_TABLET | Freq: Four times a day (QID) | ORAL | Status: DC | PRN
Start: 2016-04-11 — End: 2016-04-11
  Administered 2016-04-11: 50 mg via ORAL
  Filled 2016-04-11: qty 1

## 2016-04-11 MED ORDER — INSULIN GLARGINE 100 UNIT/ML SOLOSTAR PEN: 20.0000 [IU] | PEN_INJECTOR | Freq: Every day | SUBCUTANEOUS | Status: DC

## 2016-04-11 MED ORDER — GABAPENTIN 100 MG PO CAPS
200.0000 mg | ORAL_CAPSULE | Freq: Once | ORAL | Status: AC
  Administered 2016-04-11: 200 mg via ORAL
  Filled 2016-04-11: qty 2

## 2016-04-11 MED ORDER — GABAPENTIN 100 MG PO CAPS
100.0000 mg | ORAL_CAPSULE | Freq: Three times a day (TID) | ORAL | Status: DC
  Administered 2016-04-11: 100 mg via ORAL
  Filled 2016-04-11 (×2): qty 1

## 2016-04-11 MED ORDER — INSULIN ASPART 100 UNIT/ML ~~LOC~~ SOLN: 8.0000 [IU] | Freq: Three times a day (TID) | SUBCUTANEOUS | Status: DC

## 2016-04-11 MED ORDER — "INSULIN SYRINGE 31G X 5/16"" 0.3 ML MISC": Status: DC

## 2016-04-11 MED ORDER — INSULIN DETEMIR 100 UNIT/ML ~~LOC~~ SOLN: 20.0000 [IU] | Freq: Every day | SUBCUTANEOUS | Status: DC

## 2016-04-11 MED ORDER — BLOOD GLUCOSE MONITOR KIT: PACK | Status: DC

## 2016-04-11 MED ORDER — INSULIN GLARGINE 100 UNIT/ML ~~LOC~~ SOLN: 20.0000 [IU] | Freq: Every day | SUBCUTANEOUS | Status: DC

## 2016-04-11 MED ORDER — TRAMADOL HCL 50 MG PO TABS: 50.0000 mg | ORAL_TABLET | Freq: Four times a day (QID) | ORAL | Status: DC | PRN

## 2016-04-11 MED ORDER — INSULIN ASPART 100 UNIT/ML FLEXPEN: 8.0000 [IU] | PEN_INJECTOR | Freq: Three times a day (TID) | SUBCUTANEOUS | Status: DC

## 2016-04-11 MED ORDER — METFORMIN HCL 500 MG PO TABS: 500.0000 mg | ORAL_TABLET | Freq: Every day | ORAL | Status: DC

## 2016-04-11 NOTE — Progress Notes (Signed)
Pt discharged home, IV removed. Instructions given questions answered. DM education given.

## 2016-04-13 ENCOUNTER — Encounter (HOSPITAL_COMMUNITY): Payer: Self-pay

## 2016-04-13 ENCOUNTER — Emergency Department (HOSPITAL_COMMUNITY)
Admission: EM | Admit: 2016-04-13 | Discharge: 2016-04-13 | Disposition: A | Discharge status: Home / Self Care | Payer: 59 | Attending: Emergency Medicine | Admitting: Emergency Medicine

## 2016-04-13 DIAGNOSIS — G43909 Migraine, unspecified, not intractable, without status migrainosus: Secondary | ICD-10-CM | POA: Insufficient documentation

## 2016-04-13 DIAGNOSIS — Z794 Long term (current) use of insulin: Secondary | ICD-10-CM | POA: Diagnosis not present

## 2016-04-13 DIAGNOSIS — Z7951 Long term (current) use of inhaled steroids: Secondary | ICD-10-CM | POA: Diagnosis not present

## 2016-04-13 DIAGNOSIS — E669 Obesity, unspecified: Secondary | ICD-10-CM | POA: Diagnosis not present

## 2016-04-13 DIAGNOSIS — J441 Chronic obstructive pulmonary disease with (acute) exacerbation: Secondary | ICD-10-CM | POA: Diagnosis not present

## 2016-04-13 DIAGNOSIS — Z79899 Other long term (current) drug therapy: Secondary | ICD-10-CM | POA: Insufficient documentation

## 2016-04-13 DIAGNOSIS — G4733 Obstructive sleep apnea (adult) (pediatric): Secondary | ICD-10-CM | POA: Insufficient documentation

## 2016-04-13 DIAGNOSIS — G629 Polyneuropathy, unspecified: Secondary | ICD-10-CM | POA: Diagnosis not present

## 2016-04-13 DIAGNOSIS — Z8719 Personal history of other diseases of the digestive system: Secondary | ICD-10-CM | POA: Diagnosis not present

## 2016-04-13 DIAGNOSIS — Z86711 Personal history of pulmonary embolism: Secondary | ICD-10-CM | POA: Insufficient documentation

## 2016-04-13 DIAGNOSIS — Z9889 Other specified postprocedural states: Secondary | ICD-10-CM | POA: Diagnosis not present

## 2016-04-13 DIAGNOSIS — Z7984 Long term (current) use of oral hypoglycemic drugs: Secondary | ICD-10-CM | POA: Diagnosis not present

## 2016-04-13 DIAGNOSIS — Z8742 Personal history of other diseases of the female genital tract: Secondary | ICD-10-CM | POA: Diagnosis not present

## 2016-04-13 DIAGNOSIS — Z86718 Personal history of other venous thrombosis and embolism: Secondary | ICD-10-CM | POA: Insufficient documentation

## 2016-04-13 DIAGNOSIS — I509 Heart failure, unspecified: Secondary | ICD-10-CM | POA: Diagnosis not present

## 2016-04-13 DIAGNOSIS — R200 Anesthesia of skin: Secondary | ICD-10-CM | POA: Diagnosis present

## 2016-04-13 DIAGNOSIS — G8929 Other chronic pain: Secondary | ICD-10-CM | POA: Diagnosis not present

## 2016-04-13 DIAGNOSIS — Z9981 Dependence on supplemental oxygen: Secondary | ICD-10-CM | POA: Diagnosis not present

## 2016-04-13 DIAGNOSIS — Z87891 Personal history of nicotine dependence: Secondary | ICD-10-CM | POA: Diagnosis not present

## 2016-04-13 DIAGNOSIS — Z8701 Personal history of pneumonia (recurrent): Secondary | ICD-10-CM | POA: Diagnosis not present

## 2016-04-13 LAB — CBG MONITORING, ED: Glucose-Capillary: 132 mg/dL — ABNORMAL HIGH (ref 65–99)

## 2016-04-13 MED ORDER — OXYCODONE-ACETAMINOPHEN 5-325 MG PO TABS
2.0000 | ORAL_TABLET | Freq: Once | ORAL | Status: AC
  Administered 2016-04-13: 2 via ORAL
  Filled 2016-04-13: qty 2

## 2016-04-13 MED ORDER — OXYCODONE-ACETAMINOPHEN 5-325 MG PO TABS: 1.0000 | ORAL_TABLET | Freq: Once | ORAL | Status: DC

## 2016-04-13 MED ORDER — OXYCODONE-ACETAMINOPHEN 5-325 MG PO TABS: 1.0000 | ORAL_TABLET | ORAL | Status: DC | PRN

## 2016-04-13 NOTE — ED Notes (Signed)
Pt states she was recently discharge from hospital for COPD. Pt was given prednisone and states this increased her leg pain. This has causes her sugars to be irregular. Last checked sugars yesterday. Has not taken any AM medications except pain medications.

## 2016-04-13 NOTE — ED Notes (Signed)
MD at bedside. 

## 2016-04-13 NOTE — ED Notes (Signed)
Bed: [facility] Expected date:  Expected time:  Means of arrival:  Comments: 

## 2016-04-13 NOTE — ED Provider Notes (Signed)
CSN: [id number]     Arrival date & time 04/13/16  0865 History   First MD Initiated Contact with Patient 04/13/16 0901     Chief Complaint  Patient presents with  . Leg Pain  . Numbness  . Tingling  . Pain    chronic     (Consider location/radiation/quality/duration/timing/severity/associated sxs/prior Treatment) HPI Comments: 40 year old female with, located past medical history including CHF, COPD with recent hospitalization presents for foot pain. She reports that this started while she was in the hospital this last time. She says it's a burning stinging pain and sometimes even putting a sheet on her foot hurts. She denies any swelling or redness. No injury. No fevers or chills. She reports that her breathing has been getting better. She says she was told that this was likely neuropathy and that they gave her tramadol to help with the pain as well as gabapentin. She says she's been taking it but the pain has been so severe she is been unable to sleep. The patient is supposed to see her primary care physician today before 2 PM for follow-up.   Past Medical History  Diagnosis Date  . COPD (chronic obstructive pulmonary disease) ([place])     PFTS 03/08/12: fev1 0.58L/1%, FVC 1.18/33%, Ratop 49 and c/w ssevere obstruction. 21% BD response on FVC, RV 219%, DLCO 11/54%  . History of alcoholism ([place])   . Irregular menses   . DVT of upper extremity (deep vein thrombosis) [facility]) April 2013    right subclavian // Unclear precipitating cause - possibly significant right heart failure, with vascular stasis potentially predisposing to clotting.    . Pulmonary embolus [facility]) April 2013    Precipitating cause unclear. Was treated with coumadin from April-June 2013.  . Moderate to severe pulmonary hypertension [facility]) April 2013    Cardiac cath on 03/06/12 - 1. Elevated pulmonary artery pressures, right sided filling pressures.,  2. PA: 64/45 (mean 53)    . Hepatic cirrhosis ([place])     Questionable history of -  Noted on CT abdomen (03/2012) - thought to be due to vascular congestion from right heart failure +/- patient's history of alcohol abuse  . Axillary adenopathy     right axillary adenopathy noted on CT chest (03/06/2012)  . Periodontal disease   . Chronic right-sided CHF (congestive heart failure) ([place]) 03/23/2012    with cor pulmonale. Last RHC 03/2012  . Pneumonia ~ 1985; 01/2011  . History of chronic bronchitis   . Exertional shortness of breath   . On home oxygen therapy     "2-3 L 24/7" (05/03/2013)  . OSA (obstructive sleep apnea)     wears noctural BiPAP (05/03/2013)  . Migraine     "~ 1/yr" (05/03/2013)   Past Surgical History  Procedure Laterality Date  . Cardiac surgery  09/28/1977    "my heart was backwards" (05/03/2013)  . Lung surgery  03-30-77  . Appendectomy  05/03/2013  . Cardiac catheterization  03/2012  . Laparoscopic appendectomy N/A 05/03/2013    Procedure: APPENDECTOMY LAPAROSCOPIC;  Surgeon: Gwenyth Ober, MD;  Location: [facility];  Service: General;  Laterality: N/A;  . Right heart catheterization N/A 03/07/2012    Procedure: RIGHT HEART CATH;  Surgeon: Burnell Blanks, MD;  Location: [facility] CATH LAB;  Service: Cardiovascular;  Laterality: N/A;  . Cardiac catheterization N/A 01/08/2016    Procedure: Right Heart Cath;  Surgeon: Jolaine Artist, MD;  Location: [facility] CV LAB;  Service: Cardiovascular;  Laterality: N/A;  Family History  Problem Relation Age of Onset  . Hypertension Father   . Heart disease Father     CHF; died age 30   . Alcohol abuse Father   . Other Brother     died age 77 y.o overdose   Social History  Substance Use Topics  . Smoking status: Former Smoker -- 1.00 packs/day for 25 years    Types: Cigarettes    Quit date: 02/18/2012  . Smokeless tobacco: Never Used  . Alcohol Use: No     Comment: previously drinking 1 pint 3-4 days a week for 2002-2013, but in 2014, no longer drinking   OB History    No data available     Review of  Systems  Constitutional: Positive for fatigue. Negative for fever and chills.  HENT: Negative for congestion, postnasal drip and rhinorrhea.   Respiratory: Positive for shortness of breath (chronic but improving) and wheezing (chronic but improving). Negative for cough.   Cardiovascular: Negative for chest pain, palpitations and leg swelling.  Gastrointestinal: Negative for nausea, vomiting, abdominal pain and diarrhea.  Genitourinary: Negative for dysuria, urgency, frequency and flank pain.  Musculoskeletal: Negative for myalgias, back pain and neck stiffness.  Skin: Negative for rash.  Neurological: Negative for dizziness, syncope, weakness, numbness and headaches.  Hematological: Does not bruise/bleed easily.      Allergies  Review of patient's allergies indicates no known allergies.  Home Medications   Prior to Admission medications   Medication Sig Start Date End Date Taking? Authorizing Provider  albuterol (PROVENTIL HFA;VENTOLIN HFA) 108 (90 BASE) MCG/ACT inhaler Inhale 2 puffs into the lungs every 6 (six) hours as needed for wheezing or shortness of breath. 09/06/14  Yes Brand Males, MD  blood glucose meter kit and supplies KIT Dispense based on patient and insurance preference. Use up to four times daily as directed. (FOR ICD-9 250.00, 250.01). 04/11/16  Yes Janece Canterbury, MD  fluticasone furoate-vilanterol (BREO ELLIPTA) 100-25 MCG/INH AEPB Inhale 1 puff into the lungs daily. 02/02/16  Yes Brand Males, MD  gabapentin (NEURONTIN) 100 MG capsule Take 1 tab three times daily x 1 day, then 2 tabs three times a day x 2 days, then 3 tabs three times a day. Patient taking differently: Take 300 mg by mouth 3 (three) times daily.  04/11/16  Yes Janece Canterbury, MD  insulin detemir (LEVEMIR) 100 UNIT/ML injection Inject 0.2 mLs (20 Units total) into the skin daily. 04/11/16  Yes Janece Canterbury, MD  Insulin Glargine (BASAGLAR KWIKPEN) 100 UNIT/ML SOPN Inject 8 Units into the skin 3  (three) times daily with meals. Inject 8 units Subcutaneously 3 times daily before meals, Inject 8 Units three times daily with meals if premeal blood sugar is > 200., Indications: Type 2 Diabetes Mellitus 04/11/16  Yes Historical Provider, MD  Insulin Syringe-Needle U-100 (INSULIN SYRINGE .3CC/31GX5/16") 31G X 5/16" 0.3 ML MISC Use as indicated four times daily 04/11/16  Yes Janece Canterbury, MD  metFORMIN (GLUCOPHAGE) 500 MG tablet Take 1 tablet (500 mg total) by mouth daily with breakfast. 04/11/16  Yes Janece Canterbury, MD  pantoprazole (PROTONIX) 40 MG tablet Take 1 tablet (40 mg total) by mouth 2 (two) times daily before a meal. 04/10/16  Yes Janece Canterbury, MD  polyethylene glycol (MIRALAX / GLYCOLAX) packet Take 17 g by mouth daily as needed for mild constipation. 01/09/16  Yes Cherene Altes, MD  potassium chloride SA (K-DUR,KLOR-CON) 20 MEQ tablet Take 1 tablet (20 mEq total) by mouth daily. 01/29/16  Yes  Jolaine Artist, MD  predniSONE (DELTASONE) 10 MG tablet Take 4 tabs daily x 7 days, then 3 tabs daily x 7 days, 2 tabs daily x 7 days, 1 tab daily x 7 days 04/10/16  Yes Janece Canterbury, MD  roflumilast (DALIRESP) 500 MCG TABS tablet First week take 1 tablet every other day then 1 tablet by mouth daily Patient taking differently: Take 500 mcg by mouth daily.  02/02/16  Yes Brand Males, MD  Tiotropium Bromide Monohydrate (SPIRIVA RESPIMAT) 1.25 MCG/ACT AERS Inhale 2 puffs into the lungs 2 (two) times daily. Patient taking differently: Inhale 2 puffs into the lungs daily.  02/02/16  Yes Brand Males, MD  torsemide (DEMADEX) 20 MG tablet Take 1 tablet (20 mg total) by mouth 2 (two) times daily. Patient taking differently: Take 20 mg by mouth daily.  01/09/16  Yes Cherene Altes, MD  traMADol (ULTRAM) 50 MG tablet Take 1 tablet (50 mg total) by mouth every 6 (six) hours as needed for severe pain. 04/11/16  Yes Janece Canterbury, MD  insulin aspart (NOVOLOG) 100 UNIT/ML injection Inject 8 Units into  the skin 3 (three) times daily before meals. Inject 8 Units three times daily with meals if premeal blood sugar is > 200. Patient not taking: Reported on 04/13/2016 04/11/16   Janece Canterbury, MD   BP 116/66 mmHg  Pulse 121  Temp(Src) 97.7 F (36.5 C) (Oral)  Resp 18  SpO2 91%  LMP 04/01/2016 Physical Exam  Constitutional: She is oriented to person, place, and time. She appears well-developed and well-nourished. No distress.  Obese  HENT:  Head: Normocephalic and atraumatic.  Right Ear: External ear normal.  Left Ear: External ear normal.  Nose: Nose normal.  Mouth/Throat: Oropharynx is clear and moist. No oropharyngeal exudate.  Eyes: EOM are normal. Pupils are equal, round, and reactive to light.  Neck: Normal range of motion. Neck supple.  Cardiovascular: Normal rate, regular rhythm, normal heart sounds and intact distal pulses.   No murmur heard. Pulmonary/Chest: Effort normal. No respiratory distress. She has wheezes (few scattered). She has no rales.  Abdominal: Soft. She exhibits no distension. There is no tenderness.  Musculoskeletal: Normal range of motion. She exhibits no edema or tenderness.       Right foot: Normal.       Left foot: Normal.  Other than severe pain and tenderness of the skin over both feet no abnormality noted on examination. No swelling or erythema.  Neurological: She is alert and oriented to person, place, and time. She has normal strength. No sensory deficit.  Patient with significant pain when touching and examining her feet. Even light touch appears to cause significant pain.  Skin: Skin is warm and dry. No rash noted. She is not diaphoretic.  Vitals reviewed.   ED Course  Procedures (including critical care time) Labs Review Labs Reviewed  CBG MONITORING, ED - Abnormal; Notable for the following:    Glucose-Capillary 132 (*)    All other components within normal limits    Imaging Review No results found. I have personally reviewed and  evaluated these images and lab results as part of my medical decision-making.   EKG Interpretation None      MDM  Patient was seen and evaluated in stable condition. Presentation seems most consistent with neuropathy. Patient was given Percocet for pain control. She was discharged home with a small prescription for the same. She has an appointment with her primary care physician today which she was instructed to  keep. Patient is chronically ill with multiple medical problems but reports that her breathing seems to be improving. She is on her baseline nasal cannula oxygen. She was discharged home in stable condition. Final diagnoses:  None    1. Neuropathic pain    Harvel Quale, MD 04/13/16 [phone]

## 2016-04-13 NOTE — Discharge Instructions (Signed)
You were seen and evaluated today for your foot pain. Likely this is secondary to neuropathy. Make sure you keep your appointment with her primary care physician for today. Take the pain medicine prescribed.  Peripheral Neuropathy Peripheral neuropathy is a type of nerve damage. It affects nerves that carry signals between the spinal cord and other parts of the body. These are called peripheral nerves. With peripheral neuropathy, one nerve or a group of nerves may be damaged.  CAUSES  Many things can damage peripheral nerves. For some people with peripheral neuropathy, the cause is unknown. Some causes include:  Diabetes. This is the most common cause of peripheral neuropathy.  Injury to a nerve.  Pressure or stress on a nerve that lasts a long time.  Too little vitamin B. Alcoholism can lead to this.  Infections.  Autoimmune diseases, such as multiple sclerosis and systemic lupus erythematosus.  Inherited nerve diseases.  Some medicines, such as cancer drugs.  Toxic substances, such as lead and mercury.  Too little blood flowing to the legs.  Kidney disease.  Thyroid disease. SIGNS AND SYMPTOMS  Different people have different symptoms. The symptoms you have will depend on which of your nerves is damaged. Common symptoms include:  Loss of feeling (numbness) in the feet and hands.  Tingling in the feet and hands.  Pain that burns.  Very sensitive skin.  Weakness.  Not being able to move a part of the body (paralysis).  Muscle twitching.  Clumsiness or poor coordination.  Loss of balance.  Not being able to control your bladder.  Feeling dizzy.  Sexual problems. DIAGNOSIS  Peripheral neuropathy is a symptom, not a disease. Finding the cause of peripheral neuropathy can be hard. To figure that out, your health care provider will take a medical history and do a physical exam. A neurological exam will also be done. This involves checking things affected by your  brain, spinal cord, and nerves (nervous system). For example, your health care provider will check your reflexes, how you move, and what you can feel.  Other types of tests may also be ordered, such as:  Blood tests.  A test of the fluid in your spinal cord.  Imaging tests, such as CT scans or an MRI.  Electromyography (EMG). This test checks the nerves that control muscles.  Nerve conduction velocity tests. These tests check how fast messages pass through your nerves.  Nerve biopsy. A small piece of nerve is removed. It is then checked under a microscope. TREATMENT   Medicine is often used to treat peripheral neuropathy. Medicines may include:  Pain-relieving medicines. Prescription or over-the-counter medicine may be suggested.  Antiseizure medicine. This may be used for pain.  Antidepressants. These also may help ease pain from neuropathy.  Lidocaine. This is a numbing medicine. You might wear a patch or be given a shot.  Mexiletine. This medicine is typically used to help control irregular heart rhythms.  Surgery. Surgery may be needed to relieve pressure on a nerve or to destroy a nerve that is causing pain.  Physical therapy to help movement.  Assistive devices to help movement. HOME CARE INSTRUCTIONS   Only take over-the-counter or prescription medicines as directed by your health care provider. Follow the instructions carefully for any given medicines. Do not take any other medicines without first getting approval from your health care provider.  If you have diabetes, work closely with your health care provider to keep your blood sugar under control.  If you have numbness  in your feet:  Check every day for signs of injury or infection. Watch for redness, warmth, and swelling.  Wear padded socks and comfortable shoes. These help protect your feet.  Do not do things that put pressure on your damaged nerve.  Do not smoke. Smoking keeps blood from getting to damaged  nerves.  Avoid or limit alcohol. Too much alcohol can cause a lack of B vitamins. These vitamins are needed for healthy nerves.  Develop a good support system. Coping with peripheral neuropathy can be stressful. Talk to a mental health specialist or join a support group if you are struggling.  Follow up with your health care provider as directed. SEEK MEDICAL CARE IF:   You have new signs or symptoms of peripheral neuropathy.  You are struggling emotionally from dealing with peripheral neuropathy.  You have a fever. SEEK IMMEDIATE MEDICAL CARE IF:   You have an injury or infection that is not healing.  You feel very dizzy or begin vomiting.  You have chest pain.  You have trouble breathing.   This information is not intended to replace advice given to you by your health care provider. Make sure you discuss any questions you have with your health care provider.   Document Released: 11/12/2002 Document Revised: 08/04/2011 Document Reviewed: 07/30/2013 Elsevier Interactive Patient Education [employer]2016 [employer] Inc.

## 2016-04-13 NOTE — ED Notes (Signed)
Pt resides at home. Pt c/o BIL  Chronic numbness and tingling pain at knee and below x 3 days. Denies injury. Pt has taken her RX without relief. Pt states pain has worsened.

## 2016-04-20 ENCOUNTER — Other Ambulatory Visit (INDEPENDENT_AMBULATORY_CARE_PROVIDER_SITE_OTHER): Payer: 59

## 2016-04-20 ENCOUNTER — Encounter: Payer: Self-pay | Admitting: Adult Health

## 2016-04-20 ENCOUNTER — Ambulatory Visit (INDEPENDENT_AMBULATORY_CARE_PROVIDER_SITE_OTHER): Payer: 59 | Admitting: Adult Health

## 2016-04-20 ENCOUNTER — Telehealth: Payer: Self-pay | Admitting: General Practice

## 2016-04-20 VITALS — BP 118/70 | HR 106 | Temp 98.0°F | Ht 62.0 in | Wt 196.0 lb

## 2016-04-20 DIAGNOSIS — I5032 Chronic diastolic (congestive) heart failure: Secondary | ICD-10-CM

## 2016-04-20 DIAGNOSIS — R0600 Dyspnea, unspecified: Secondary | ICD-10-CM | POA: Diagnosis not present

## 2016-04-20 DIAGNOSIS — J449 Chronic obstructive pulmonary disease, unspecified: Secondary | ICD-10-CM | POA: Diagnosis not present

## 2016-04-20 DIAGNOSIS — G4733 Obstructive sleep apnea (adult) (pediatric): Secondary | ICD-10-CM

## 2016-04-20 LAB — BASIC METABOLIC PANEL
BUN: 16 mg/dL (ref 6–23)
CHLORIDE: 90 meq/L — AB (ref 96–112)
CO2: 42 mEq/L — ABNORMAL HIGH (ref 19–32)
CREATININE: 0.71 mg/dL (ref 0.40–1.20)
Calcium: 10 mg/dL (ref 8.4–10.5)
GFR: 97.65 mL/min (ref >60.00)
Glucose, Bld: 153 mg/dL — ABNORMAL HIGH (ref 70–99)
POTASSIUM: 4.7 meq/L (ref 3.5–5.1)
Sodium: 139 mEq/L (ref 135–145)

## 2016-04-20 LAB — BRAIN NATRIURETIC PEPTIDE: PRO B NATRI PEPTIDE: 30 pg/mL (ref 0.0–100.0)

## 2016-04-20 NOTE — Telephone Encounter (Signed)
Spouse came in wanting to schedule appt for patient.  Is a current patient of pulmonary.  States needs to have an appointment soon.  Can patient be worked in sooner.

## 2016-04-20 NOTE — Progress Notes (Signed)
Subjective:    Patient ID: KYNSLEI ART, female    DOB: 06/05/1976, 40 y.o.   MRN: [id number]  HPI 40 yo female former smoker with very severe COPD and chronic resp failure and severe pulmonary HTN  OSA/OHS on BIPAP  Hx of congenital heart surgery .  [place] 2013 with severe pulmonary HTN . Echo in 2015 w/ nml heart function.  Deemed not heart transplant due to difficult social situation  TEST  Alpha 1 MM 2013  PFTS 03/08/12: fev1 0.58L/1%, FVC 1.18/33%, Ratop 49 and c/w ssevere obstruction. 21% BD response on FVC, RV 219%, DLCO 11/54% - PFT 04/10/12: fev1 0.54L/18%, 31% BD response, RATio 49, TL 88%, RV 204$, DLCO 30%,  - Abg baseline MAy 2013: 7.43/50/81 CTA Chest 12/16/15 >neg PE, stable scarring in lingular/LUL.     04/20/2016 [facility] Follow up  Pt returns for a post hospital follow up . She was re- admitted 4/27 to 5/7 for acute on chronic hypoxic RF  with COPD exacerbation  And Diastolic CHF decompensation.  Marland Kitchen Has been in hospital 4 times over last 6 months . Tx w/ IV abs, steroids and diuresis . Still feels weak and has cough and congestion with clear mucsu and DOE. Denies chest pain, orthopnea , hemoptysis, n/v/d.  Has cut back on Torsemide to daily . Ankles more swollen.  Wears BIPAP At bedtime  . Remains on prednisone taper.  Remains on spiriva and BREO . And daliresp .  She is on O2 at 2l/m .  She remains on BIPAP At bedtime  .    Past Medical History  Diagnosis Date  . COPD (chronic obstructive pulmonary disease) ([place])     PFTS 03/08/12: fev1 0.58L/1%, FVC 1.18/33%, Ratop 49 and c/w ssevere obstruction. 21% BD response on FVC, RV 219%, DLCO 11/54%  . History of alcoholism ([place])   . Irregular menses   . DVT of upper extremity (deep vein thrombosis) [facility]) April 2013    right subclavian // Unclear precipitating cause - possibly significant right heart failure, with vascular stasis potentially predisposing to clotting.    . Pulmonary embolus [facility]) April 2013   Precipitating cause unclear. Was treated with coumadin from April-June 2013.  . Moderate to severe pulmonary hypertension [facility]) April 2013    Cardiac cath on 03/06/12 - 1. Elevated pulmonary artery pressures, right sided filling pressures.,  2. PA: 64/45 (mean 53)    . Hepatic cirrhosis ([place])     Questionable history of - Noted on CT abdomen (03/2012) - thought to be due to vascular congestion from right heart failure +/- patient's history of alcohol abuse  . Axillary adenopathy     right axillary adenopathy noted on CT chest (03/06/2012)  . Periodontal disease   . Chronic right-sided CHF (congestive heart failure) ([place]) 03/23/2012    with cor pulmonale. Last RHC 03/2012  . Pneumonia ~ 1985; 01/2011  . History of chronic bronchitis   . Exertional shortness of breath   . On home oxygen therapy     "2-3 L 24/7" (05/03/2013)  . OSA (obstructive sleep apnea)     wears noctural BiPAP (05/03/2013)  . Migraine     "~ 1/yr" (05/03/2013)   Current Outpatient Prescriptions on File Prior to Visit  Medication Sig Dispense Refill  . albuterol (PROVENTIL HFA;VENTOLIN HFA) 108 (90 BASE) MCG/ACT inhaler Inhale 2 puffs into the lungs every 6 (six) hours as needed for wheezing or shortness of breath. 8.5 g 1  . blood glucose meter  kit and supplies KIT Dispense based on patient and insurance preference. Use up to four times daily as directed. (FOR ICD-9 250.00, 250.01). 1 each 0  . fluticasone furoate-vilanterol (BREO ELLIPTA) 100-25 MCG/INH AEPB Inhale 1 puff into the lungs daily. 30 each 3  . gabapentin (NEURONTIN) 100 MG capsule Take 1 tab three times daily x 1 day, then 2 tabs three times a day x 2 days, then 3 tabs three times a day. (Patient taking differently: Take 300 mg by mouth 3 (three) times daily. ) 270 capsule 0  . Insulin Glargine (BASAGLAR KWIKPEN) 100 UNIT/ML SOPN Inject 8 Units into the skin 3 (three) times daily with meals. Inject 8 units Subcutaneously 3 times daily before meals, Inject 8 Units  three times daily with meals if premeal blood sugar is > 200., Indications: Type 2 Diabetes Mellitus    . Insulin Syringe-Needle U-100 (INSULIN SYRINGE .3CC/31GX5/16") 31G X 5/16" 0.3 ML MISC Use as indicated four times daily 200 each 0  . metFORMIN (GLUCOPHAGE) 500 MG tablet Take 1 tablet (500 mg total) by mouth daily with breakfast. 30 tablet 0  . pantoprazole (PROTONIX) 40 MG tablet Take 1 tablet (40 mg total) by mouth 2 (two) times daily before a meal. 60 tablet 0  . potassium chloride SA (K-DUR,KLOR-CON) 20 MEQ tablet Take 1 tablet (20 mEq total) by mouth daily. (Patient taking differently: Take 40 mEq by mouth daily. ) 30 tablet 6  . predniSONE (DELTASONE) 10 MG tablet Take 4 tabs daily x 7 days, then 3 tabs daily x 7 days, 2 tabs daily x 7 days, 1 tab daily x 7 days 70 tablet 0  . roflumilast (DALIRESP) 500 MCG TABS tablet First week take 1 tablet every other day then 1 tablet by mouth daily (Patient taking differently: Take 500 mcg by mouth daily. ) 30 tablet 2  . Tiotropium Bromide Monohydrate (SPIRIVA RESPIMAT) 1.25 MCG/ACT AERS Inhale 2 puffs into the lungs 2 (two) times daily. (Patient taking differently: Inhale 2 puffs into the lungs daily. ) 1 Inhaler 3  . torsemide (DEMADEX) 20 MG tablet Take 1 tablet (20 mg total) by mouth 2 (two) times daily. (Patient taking differently: Take 20 mg by mouth daily. ) 60 tablet 0  . oxyCODONE-acetaminophen (PERCOCET/ROXICET) 5-325 MG tablet Take 1-2 tablets by mouth every 4 (four) hours as needed for severe pain. (Patient not taking: Reported on 04/20/2016) 6 tablet 0  . polyethylene glycol (MIRALAX / GLYCOLAX) packet Take 17 g by mouth daily as needed for mild constipation. (Patient not taking: Reported on 04/20/2016) 14 each 0  . traMADol (ULTRAM) 50 MG tablet Take 1 tablet (50 mg total) by mouth every 6 (six) hours as needed for severe pain. (Patient not taking: Reported on 04/20/2016) 20 tablet 0   No current facility-administered medications on file  prior to visit.     Review of Systems Constitutional:   No  weight loss, night sweats,  Fevers, chills,  +fatigue, or  lassitude.  HEENT:   No headaches,  Difficulty swallowing,  Tooth/dental problems, or  Sore throat,                No sneezing, itching, ear ache, nasal congestion, post nasal drip,   CV:  No chest pain,  Orthopnea, PND, swelling in lower extremities, anasarca, dizziness, palpitations, syncope.   GI  No heartburn, indigestion, abdominal pain, nausea, vomiting, diarrhea, change in bowel habits, loss of appetite, bloody stools.   Resp:    No chest wall  deformity  Skin: no rash or lesions.  GU: no dysuria, change in color of urine, no urgency or frequency.  No flank pain, no hematuria   MS:  No joint pain or swelling.  No decreased range of motion.  No back pain.  Psych:  No change in mood or affect. No depression or anxiety.  No memory loss.         Objective:   Physical Exam  Filed Vitals:   04/20/16 1617  BP: 118/70  Pulse: 106  Temp: 98 F (36.7 C)  TempSrc: Oral  Height: 5' 2" (1.575 m)  Weight: 196 lb (88.905 kg)  SpO2: 95%   Body mass index is 35.84 kg/(m^2).   GEN: A/Ox3; pleasant , NAD, morbidly obese  HEENT:  [place]/AT,  EACs-clear, TMs-wnl, NOSE-clear, THROAT-clear, no lesions, no postnasal drip or exudate noted.   NECK:  Supple w/ fair ROM; no JVD; normal carotid impulses w/o bruits; no thyromegaly or nodules palpated; no lymphadenopathy.  RESP  Decreased BS in bases, no accessory muscle use, no dullness to percussion  CARD:  RRR, no m/r/g  , tr  peripheral edema, pulses intact, no cyanosis or clubbing.  GI:   Soft & nt; nml bowel sounds; no organomegaly or masses detected.  Musco: Warm bil, no deformities or joint swelling noted.   Neuro: alert, no focal deficits noted.    Skin: Warm, no lesions or rashes  [name] NP-C  [facility]  04/20/2016       Assessment & Plan:

## 2016-04-20 NOTE — Patient Instructions (Addendum)
Increase Torsemide Twice daily  For next 3 days then take daily with option of second dose if leg swelling, wt gain >3 lbs..  Continue on BREO  and Spiriva .  Continue on Oxygen 2l/m .  Continue on BIPAP At bedtime with oxygen . And with naps.  Taper prednisone as directed.  Labs today .  Follow up Dr. Marchelle Gearingamaswamy in 6 weeks and As needed   Please contact office for sooner follow up if symptoms do not improve or worsen or seek emergency care

## 2016-04-20 NOTE — Telephone Encounter (Signed)
Patient is requesting referral to pain management

## 2016-04-20 NOTE — Telephone Encounter (Signed)
Ok for 30 min appointment next week.

## 2016-04-21 ENCOUNTER — Telehealth: Payer: Self-pay | Admitting: Adult Health

## 2016-04-21 NOTE — Telephone Encounter (Signed)
ATC but VM is not set up. Unable to leave message.

## 2016-04-21 NOTE — Telephone Encounter (Signed)
Got patient worked in sooner.

## 2016-04-22 NOTE — Telephone Encounter (Signed)
Spoke with pt and [facility]  Pt verbalized understanding.

## 2016-04-26 NOTE — Assessment & Plan Note (Signed)
Cont on BIPAP   Plan  Continue on BIPAP At bedtime with oxygen . And with naps.  Follow up Dr. Marchelle Gearingamaswamy in 6 weeks and As needed   Please contact office for sooner follow up if symptoms do not improve or worsen or seek emergency care

## 2016-04-26 NOTE — Assessment & Plan Note (Signed)
Recurrent flare , avoid volume overload.   Plan  Increase Torsemide Twice daily  For next 3 days then take daily with option of second dose if leg swelling, wt gain >3 lbs..  Continue on BREO  and Spiriva .  Continue on Oxygen 2l/m .  Continue on BIPAP At bedtime with oxygen . And with naps.  Taper prednisone as directed.  Labs today .  Follow up Dr. Marchelle Gearingamaswamy in 6 weeks and As needed   Please contact office for sooner follow up if symptoms do not improve or worsen or seek emergency care

## 2016-04-26 NOTE — Assessment & Plan Note (Signed)
Recent decompensation -avoid volume overload  Plan  ncrease Torsemide Twice daily  For next 3 days then take daily with option of second dose if leg swelling, wt gain >3 lbs..  Continue on BREO  and Spiriva .  Continue on Oxygen 2l/m .  Continue on BIPAP At bedtime with oxygen . And with naps.  Taper prednisone as directed.  Labs today .  Follow up Dr. Marchelle Gearingamaswamy in 6 weeks and As needed   Please contact office for sooner follow up if symptoms do not improve or worsen or seek emergency care

## 2016-04-30 ENCOUNTER — Encounter: Payer: Self-pay | Admitting: Family

## 2016-04-30 ENCOUNTER — Ambulatory Visit (INDEPENDENT_AMBULATORY_CARE_PROVIDER_SITE_OTHER): Payer: 59 | Admitting: Family

## 2016-04-30 VITALS — BP 122/82 | HR 118 | Temp 98.2°F | Resp 16 | Ht 62.0 in | Wt 194.0 lb

## 2016-04-30 DIAGNOSIS — E119 Type 2 diabetes mellitus without complications: Secondary | ICD-10-CM | POA: Diagnosis not present

## 2016-04-30 DIAGNOSIS — I509 Heart failure, unspecified: Secondary | ICD-10-CM | POA: Diagnosis not present

## 2016-04-30 DIAGNOSIS — E1142 Type 2 diabetes mellitus with diabetic polyneuropathy: Secondary | ICD-10-CM | POA: Diagnosis not present

## 2016-04-30 DIAGNOSIS — E1140 Type 2 diabetes mellitus with diabetic neuropathy, unspecified: Secondary | ICD-10-CM | POA: Insufficient documentation

## 2016-04-30 DIAGNOSIS — E669 Obesity, unspecified: Secondary | ICD-10-CM

## 2016-04-30 DIAGNOSIS — I2781 Cor pulmonale (chronic): Secondary | ICD-10-CM | POA: Diagnosis not present

## 2016-04-30 DIAGNOSIS — I50812 Chronic right heart failure: Secondary | ICD-10-CM

## 2016-04-30 DIAGNOSIS — E1169 Type 2 diabetes mellitus with other specified complication: Secondary | ICD-10-CM

## 2016-04-30 MED ORDER — TRAMADOL HCL 50 MG PO TABS: 50.0000 mg | ORAL_TABLET | Freq: Four times a day (QID) | ORAL | Status: DC | PRN

## 2016-04-30 MED ORDER — GABAPENTIN 300 MG PO CAPS: ORAL_CAPSULE | ORAL | Status: DC

## 2016-04-30 MED ORDER — PREGABALIN 50 MG PO CAPS: 50.0000 mg | ORAL_CAPSULE | Freq: Three times a day (TID) | ORAL | Status: DC

## 2016-04-30 NOTE — Progress Notes (Signed)
Subjective:    Patient ID: Mary [name], female    DOB: [birth year], 40 y.o.   MRN: [id number]  Chief Complaint  Patient presents with  . Establish Care    referral to pain management, has severe feet pain since taking the prednisone it has caused sugars to go up which caused neuropathy     HPI:  Mary [name] is a 40 y.o. female who  has a past medical history of COPD (chronic obstructive pulmonary disease) ([place]); History of alcoholism ([place]); Irregular menses; DVT of upper extremity (deep vein thrombosis) [facility]) (April 2013); Pulmonary embolus [facility]) (April 2013); Moderate to severe pulmonary hypertension [facility]) (April 2013); Hepatic cirrhosis ([place]); Axillary adenopathy; Periodontal disease; Chronic right-sided CHF (congestive heart failure) ([place]) (03/23/2012); Pneumonia (~ 1985; 01/2011); History of chronic bronchitis; Exertional shortness of breath; On home oxygen therapy; OSA (obstructive sleep apnea); and Migraine. and presents today for an office visit to establish care.   1.) Type 2 diabetes - Currently maintained on metformin and Basaglar, Humalog. Reports taking the medication as prescribed and denies adverse side effects or hypoglycemic readings. Does experience significant neuropathy in her feet which was recently been exacerbated by her and is in treatment. Neuropathy is currently maintained on gabapentin which helps throughout the day. Currently taking 1800 mg daily.   Lab Results  Component Value Date   HGBA1C 5.9* 04/05/2016   2.)  Chronic diastolic heart failure - Most recent 2D Echocardiogram shows EF 55-60% and Grade 1 diastolic dysfunction with no regional wall abnormalities. Systolic pulmonary artery pressures were mildly to moderately elevatd with PA peak pressure of 44 mm Hg. Currently maintained on Torsemide and potassium. Reports taking her medications as prescribed and denies adverse side effects. Does weigh herself daily with no significant weight changes. She is  followed by the [facility] Failure Clinic.   3.) COPD - Currently maintained on pressurized oxygen via nasal cannula with albuterol, Breo, Daliresp and Spiriva. Reports using her oxygen and taking her medications as prescribed without adverse side effects. Notes her symptoms to be fairly well managed currently. Followed by Pulmonology. Previous notes with concern for poor prognosis with advanced disease. Unlikely a candidate for lung transplantation secondary to weight and lack of pulmonary rehabilitation.    No Known Allergies   Current Outpatient Prescriptions on File Prior to Visit  Medication Sig Dispense Refill  . albuterol (PROVENTIL HFA;VENTOLIN HFA) 108 (90 BASE) MCG/ACT inhaler Inhale 2 puffs into the lungs every 6 (six) hours as needed for wheezing or shortness of breath. 8.5 g 1  . blood glucose meter kit and supplies KIT Dispense based on patient and insurance preference. Use up to four times daily as directed. (FOR ICD-9 250.00, 250.01). 1 each 0  . fluticasone furoate-vilanterol (BREO ELLIPTA) 100-25 MCG/INH AEPB Inhale 1 puff into the lungs daily. 30 each 3  . HUMALOG KWIKPEN 100 UNIT/ML KiwkPen INJECT 8 UNITS 3 TIMES DAILY WITH MEALS IF PRE-MEAL BLOOD SUGAR> 200  0  . Insulin Glargine (BASAGLAR KWIKPEN) 100 UNIT/ML SOPN Inject 20 Units into the skin daily.     . Insulin Syringe-Needle U-100 (INSULIN SYRINGE .3CC/31GX5/16") 31G X 5/16" 0.3 ML MISC Use as indicated four times daily 200 each 0  . metFORMIN (GLUCOPHAGE) 500 MG tablet Take 1 tablet (500 mg total) by mouth daily with breakfast. 30 tablet 0  . pantoprazole (PROTONIX) 40 MG tablet Take 1 tablet (40 mg total) by mouth 2 (two) times daily before a meal. 60 tablet 0  . polyethylene  glycol (MIRALAX / GLYCOLAX) packet Take 17 g by mouth daily as needed for mild constipation. 14 each 0  . potassium chloride SA (K-DUR,KLOR-CON) 20 MEQ tablet Take 1 tablet (20 mEq total) by mouth daily. (Patient taking differently: Take 40 mEq by mouth  daily. ) 30 tablet 6  . roflumilast (DALIRESP) 500 MCG TABS tablet First week take 1 tablet every other day then 1 tablet by mouth daily (Patient taking differently: Take 500 mcg by mouth daily. ) 30 tablet 2  . Tiotropium Bromide Monohydrate (SPIRIVA RESPIMAT) 1.25 MCG/ACT AERS Inhale 2 puffs into the lungs 2 (two) times daily. (Patient taking differently: Inhale 2 puffs into the lungs daily. ) 1 Inhaler 3  . torsemide (DEMADEX) 20 MG tablet Take 1 tablet (20 mg total) by mouth 2 (two) times daily. (Patient taking differently: Take 20 mg by mouth daily. ) 60 tablet 0   No current facility-administered medications on file prior to visit.     Past Surgical History  Procedure Laterality Date  . Cardiac surgery  Feb 07, 1977    "my heart was backwards" (05/03/2013)  . Lung surgery  1977/06/14  . Appendectomy  05/03/2013  . Cardiac catheterization  03/2012  . Laparoscopic appendectomy N/A 05/03/2013    Procedure: APPENDECTOMY LAPAROSCOPIC;  Surgeon: Gwenyth Ober, MD;  Location: [place];  Service: General;  Laterality: N/A;  . Right heart catheterization N/A 03/07/2012    Procedure: RIGHT HEART CATH;  Surgeon: Burnell Blanks, MD;  Location: [facility] CATH LAB;  Service: Cardiovascular;  Laterality: N/A;  . Cardiac catheterization N/A 01/08/2016    Procedure: Right Heart Cath;  Surgeon: Jolaine Artist, MD;  Location: [place] CV LAB;  Service: Cardiovascular;  Laterality: N/A;      Review of Systems  Constitutional: Negative for fever and chills.  Respiratory: Positive for shortness of breath. Negative for cough, chest tightness and wheezing.   Cardiovascular: Negative for chest pain, palpitations and leg swelling.  Endocrine: Negative for polydipsia, polyphagia and polyuria.  Neurological: Positive for numbness. Negative for weakness.      Objective:    BP 122/82 mmHg  Pulse 118  Temp(Src) 98.2 F (36.8 C) (Oral)  Resp 16  Ht 5' 2"  (1.575 m)  Wt 194 lb (87.998 kg)  BMI 35.47 kg/m2   SpO2 95%  LMP 04/01/2016 Nursing note and vital signs reviewed.  Physical Exam  Constitutional: She is oriented to person, place, and time. She appears well-developed and well-nourished. No distress.  Seated in the chair with pressurized oxygen via nasal cannula. Able to speak in complete sentences.   Cardiovascular: Normal rate, regular rhythm, normal heart sounds and intact distal pulses.   Pulmonary/Chest: Effort normal and breath sounds normal. No respiratory distress. She has no wheezes. She has no rales. She exhibits no tenderness.  Neurological: She is alert and oriented to person, place, and time.  Diabetic foot exam - There is significant hard callus located on her bilateral heels with the left worse than the right. There is no other skin breakdown, cuts or abrasions. Toenails are fairly long and starting to curve.  Pulses are intact and appropriate. Sensation is intact to monofilament bilaterally.  Skin: Skin is warm and dry. She is not diaphoretic.  Psychiatric: She has a normal mood and affect. Her behavior is normal. Judgment and thought content normal.       Assessment & Plan:   Problem List Items Addressed This Visit      Cardiovascular and Mediastinum   Chronic  right-sided CHF (congestive heart failure) (HCC) (Chronic)    Heart failure related to cor pulmonale and pulmonary artery hypertension. Most recent echocardiogram appears stable and slightly improved. Euvolemic today. Encouraged to continue to monitor weight at home. Continue current dosage of torsemide and potassium. Follow up with Heart Failure Clinic as scheduled.       Cor pulmonale (HCC)     Endocrine   Diabetes mellitus type 2 in obese ([place]) - Primary    Steroid-induced type 2 diabetes and maintained on metformin, Basaglar, and Humalog. Most recent A1c of 5.9 indicating adequate control. Continue current dosage of metformin and Humalog. Increase Basaglar to 25 units daily. Diabetic foot exam completed today  with referral placed to podiatry for diabetic foot care. Not currently maintained on ACE/ARB or stating for CAD risk reduction. Encouraged to complete diabetic eye exam independently.       Diabetic neuropathy (HCC)    Sensation is intact and appropriate in bilateral feet with significant callus formation of the left heel. Symptoms of neuropathy refractory to 1800 mg of gabapentin. Start Lyrica. If symptoms worsen or do not improve consider referral to neurology for further assessment of neuropathy. Diabetes appears fluctuating with prednisone treatment.      Relevant Medications   pregabalin (LYRICA) 50 MG capsule   traMADol (ULTRAM) 50 MG tablet   gabapentin (NEURONTIN) 300 MG capsule       I have discontinued Ms. Papandrea predniSONE and oxyCODONE-acetaminophen. I have also changed her gabapentin. Additionally, I am having her start on pregabalin. Lastly, I am having her maintain her albuterol, polyethylene glycol, torsemide, potassium chloride SA, fluticasone furoate-vilanterol, Tiotropium Bromide Monohydrate, roflumilast, pantoprazole, blood glucose meter kit and supplies, metFORMIN, INSULIN SYRINGE .3CC/31GX5/16", BASAGLAR KWIKPEN, HUMALOG KWIKPEN, and traMADol.   Meds ordered this encounter  Medications  . pregabalin (LYRICA) 50 MG capsule    Sig: Take 1 capsule (50 mg total) by mouth 3 (three) times daily.    Dispense:  90 capsule    Refill:  0    Order Specific Question:  Supervising Provider    Answer:  Pricilla Holm A [[id number]]  . traMADol (ULTRAM) 50 MG tablet    Sig: Take 1 tablet (50 mg total) by mouth every 6 (six) hours as needed for severe pain.    Dispense:  120 tablet    Refill:  0    Order Specific Question:  Supervising Provider    Answer:  Pricilla Holm A [[id number]]  . gabapentin (NEURONTIN) 300 MG capsule    Sig: Take 2 capsules by mouth 3 times daily.    Dispense:  180 capsule    Refill:  0    Order Specific Question:  Supervising Provider    Answer:   Pricilla Holm A [[id number]]     Follow-up: Return in about 1 month (around 05/31/2016).  Mauricio Po, FNP

## 2016-04-30 NOTE — Patient Instructions (Addendum)
Thank you for choosing [facility].  Summary/Instructions:  Please continue to take her medications as prescribed.  Please start taking the Lyrica.  They will call to schedule your appointment with podiatry.  Your prescription(s) have been submitted to your pharmacy or been printed and provided for you. Please take as directed and contact our office if you believe you are having problem(s) with the medication(s) or have any questions.  Referrals have been made during this visit. You should expect to hear back from our schedulers in about 7-10 days in regards to establishing an appointment with the specialists we discussed.   If your symptoms worsen or fail to improve, please contact our office for further instruction, or in case of emergency go directly to the emergency room at the closest medical facility.

## 2016-04-30 NOTE — Assessment & Plan Note (Signed)
Sensation is intact and appropriate in bilateral feet with significant callus formation of the left heel. Symptoms of neuropathy refractory to 1800 mg of gabapentin. Start Lyrica. If symptoms worsen or do not improve consider referral to neurology for further assessment of neuropathy. Diabetes appears fluctuating with prednisone treatment.

## 2016-04-30 NOTE — Assessment & Plan Note (Signed)
Heart failure related to cor pulmonale and pulmonary artery hypertension. Most recent echocardiogram appears stable and slightly improved. Euvolemic today. Encouraged to continue to monitor weight at home. Continue current dosage of torsemide and potassium. Follow up with Heart Failure Clinic as scheduled.

## 2016-04-30 NOTE — Progress Notes (Signed)
Pre visit review using our clinic review tool, if applicable. No additional management support is needed unless otherwise documented below in the visit note. 

## 2016-04-30 NOTE — Assessment & Plan Note (Signed)
Steroid-induced type 2 diabetes and maintained on metformin, Basaglar, and Humalog. Most recent A1c of 5.9 indicating adequate control. Continue current dosage of metformin and Humalog. Increase Basaglar to 25 units daily. Diabetic foot exam completed today with referral placed to podiatry for diabetic foot care. Not currently maintained on ACE/ARB or stating for CAD risk reduction. Encouraged to complete diabetic eye exam independently.

## 2016-05-04 ENCOUNTER — Telehealth: Payer: Self-pay

## 2016-05-04 NOTE — Telephone Encounter (Signed)
PA initiated and APPROVED via CoverMyMeds key [id number]

## 2016-05-18 ENCOUNTER — Ambulatory Visit: Payer: 59 | Admitting: Family

## 2016-05-31 ENCOUNTER — Encounter: Payer: Self-pay | Admitting: Family

## 2016-05-31 ENCOUNTER — Ambulatory Visit (INDEPENDENT_AMBULATORY_CARE_PROVIDER_SITE_OTHER): Payer: 59 | Admitting: Family

## 2016-05-31 VITALS — BP 126/88 | HR 102 | Temp 98.2°F | Resp 18 | Ht 62.0 in | Wt 193.0 lb

## 2016-05-31 DIAGNOSIS — E119 Type 2 diabetes mellitus without complications: Secondary | ICD-10-CM | POA: Diagnosis not present

## 2016-05-31 DIAGNOSIS — R112 Nausea with vomiting, unspecified: Secondary | ICD-10-CM

## 2016-05-31 DIAGNOSIS — E1142 Type 2 diabetes mellitus with diabetic polyneuropathy: Secondary | ICD-10-CM

## 2016-05-31 DIAGNOSIS — E1169 Type 2 diabetes mellitus with other specified complication: Secondary | ICD-10-CM

## 2016-05-31 DIAGNOSIS — E669 Obesity, unspecified: Secondary | ICD-10-CM

## 2016-05-31 LAB — POCT GLYCOSYLATED HEMOGLOBIN (HGB A1C): Hemoglobin A1C: 5.9

## 2016-05-31 MED ORDER — METFORMIN HCL 500 MG PO TABS: 500.0000 mg | ORAL_TABLET | Freq: Every day | ORAL | Status: DC

## 2016-05-31 MED ORDER — TRAMADOL HCL 50 MG PO TABS: 50.0000 mg | ORAL_TABLET | Freq: Four times a day (QID) | ORAL | Status: DC | PRN

## 2016-05-31 MED ORDER — GABAPENTIN 300 MG PO CAPS: ORAL_CAPSULE | ORAL | Status: DC

## 2016-05-31 MED ORDER — INSULIN PEN NEEDLE 31G X 8 MM MISC: Status: DC

## 2016-05-31 MED ORDER — BASAGLAR KWIKPEN 100 UNIT/ML ~~LOC~~ SOPN: 20.0000 [IU] | PEN_INJECTOR | Freq: Every day | SUBCUTANEOUS | Status: DC

## 2016-05-31 MED ORDER — PREGABALIN 50 MG PO CAPS: 50.0000 mg | ORAL_CAPSULE | Freq: Three times a day (TID) | ORAL | Status: DC

## 2016-05-31 NOTE — Progress Notes (Signed)
Subjective:    Patient ID: Mary [name], female    DOB: [birth year], 40 y.o.   MRN: [id number]  Chief Complaint  Patient presents with  . Follow-up    needs med refills, had a fall on saturday, has a bruise on her butt and having pain     HPI:  BRIHANNA [name] is a 40 y.o. female who  has a past medical history of COPD (chronic obstructive pulmonary disease) ([place]); History of alcoholism ([place]); Irregular menses; DVT of upper extremity (deep vein thrombosis) [facility]) (April 2013); Pulmonary embolus [facility]) (April 2013); Moderate to severe pulmonary hypertension [facility]) (April 2013); Hepatic cirrhosis ([place]); Axillary adenopathy; Periodontal disease; Chronic right-sided CHF (congestive heart failure) ([place]) (03/23/2012); Pneumonia (~ 1985; 01/2011); History of chronic bronchitis; Exertional shortness of breath; On home oxygen therapy; OSA (obstructive sleep apnea); and Migraine. and presents today for an office visit.   1.) Type 2 diabetes - Currently maintined on Basaglar, Humalog and metformin. Reports taking the medication as prescribed and denies adverse side effects. Notes that her blood sugars at home have been in the 140's with the highest being 160. Has been out of the metformin for a few weeks. Continues to have diabetic neuropathy that is currently managed with Lyrica and gabapentin.    Lab Results  Component Value Date   HGBA1C 5.9 05/31/2016   2.) Transient nausea - This is a new problem. Associated symptom of nausea occurs when she consumes a significant amount of food with the result of vomiting that has been going on for about 1.5-2 weeks. Modifying factors include different foods. She is currently maintained on 40 mg pantoprazole twice daily. Denies constipation or abdominal pain.   No Known Allergies   Current Outpatient Prescriptions on File Prior to Visit  Medication Sig Dispense Refill  . albuterol (PROVENTIL HFA;VENTOLIN HFA) 108 (90 BASE) MCG/ACT inhaler Inhale 2 puffs into  the lungs every 6 (six) hours as needed for wheezing or shortness of breath. 8.5 g 1  . blood glucose meter kit and supplies KIT Dispense based on patient and insurance preference. Use up to four times daily as directed. (FOR ICD-9 250.00, 250.01). 1 each 0  . fluticasone furoate-vilanterol (BREO ELLIPTA) 100-25 MCG/INH AEPB Inhale 1 puff into the lungs daily. 30 each 3  . HUMALOG KWIKPEN 100 UNIT/ML KiwkPen INJECT 8 UNITS 3 TIMES DAILY WITH MEALS IF PRE-MEAL BLOOD SUGAR> 200  0  . pantoprazole (PROTONIX) 40 MG tablet Take 1 tablet (40 mg total) by mouth 2 (two) times daily before a meal. 60 tablet 0  . polyethylene glycol (MIRALAX / GLYCOLAX) packet Take 17 g by mouth daily as needed for mild constipation. 14 each 0  . potassium chloride SA (K-DUR,KLOR-CON) 20 MEQ tablet Take 1 tablet (20 mEq total) by mouth daily. (Patient taking differently: Take 40 mEq by mouth daily. ) 30 tablet 6  . roflumilast (DALIRESP) 500 MCG TABS tablet First week take 1 tablet every other day then 1 tablet by mouth daily (Patient taking differently: Take 500 mcg by mouth daily. ) 30 tablet 2  . Tiotropium Bromide Monohydrate (SPIRIVA RESPIMAT) 1.25 MCG/ACT AERS Inhale 2 puffs into the lungs 2 (two) times daily. (Patient taking differently: Inhale 2 puffs into the lungs daily. ) 1 Inhaler 3  . torsemide (DEMADEX) 20 MG tablet Take 1 tablet (20 mg total) by mouth 2 (two) times daily. (Patient taking differently: Take 20 mg by mouth daily. ) 60 tablet 0   No current facility-administered medications  on file prior to visit.     Past Surgical History  Procedure Laterality Date  . Cardiac surgery  09-16-77    "my heart was backwards" (05/03/2013)  . Lung surgery  05-25-77  . Appendectomy  05/03/2013  . Cardiac catheterization  03/2012  . Laparoscopic appendectomy N/A 05/03/2013    Procedure: APPENDECTOMY LAPAROSCOPIC;  Surgeon: Gwenyth Ober, MD;  Location: [place];  Service: General;  Laterality: N/A;  . Right heart  catheterization N/A 03/07/2012    Procedure: RIGHT HEART CATH;  Surgeon: Burnell Blanks, MD;  Location[facility] CATH LAB;  Service: Cardiovascular;  Laterality: N/A;  . Cardiac catheterization N/A 01/08/2016    Procedure: Right Heart Cath;  Surgeon: Jolaine Artist, MD;  Location: [place] CV LAB;  Service: Cardiovascular;  Laterality: N/A;     Review of Systems  Constitutional: Negative for fever and chills.  Eyes:       Denies changes in vision  Gastrointestinal: Positive for nausea and vomiting. Negative for abdominal pain, diarrhea, constipation, blood in stool and abdominal distention.  Endocrine: Negative for polydipsia, polyphagia and polyuria.  Neurological: Negative for numbness.      Objective:    BP 126/88 mmHg  Pulse 102  Temp(Src) 98.2 F (36.8 C) (Oral)  Resp 18  Ht 5' 2"  (1.575 m)  Wt 193 lb (87.544 kg)  BMI 35.29 kg/m2  SpO2 95% Nursing note and vital signs reviewed.  Physical Exam  Constitutional: She is oriented to person, place, and time. She appears well-developed and well-nourished. No distress.  Cardiovascular: Normal rate, regular rhythm, normal heart sounds and intact distal pulses.   Pulmonary/Chest: Effort normal and breath sounds normal.  Abdominal: Normal appearance and bowel sounds are normal. There is no tenderness. There is no rigidity, no rebound, no guarding, no tenderness at McBurney's point and negative Murphy's sign.  Neurological: She is alert and oriented to person, place, and time.  Skin: Skin is warm and dry.  Psychiatric: She has a normal mood and affect. Her behavior is normal. Judgment and thought content normal.       Assessment & Plan:   Problem List Items Addressed This Visit      Digestive   Nausea with vomiting    Nausea with occasional vomiting associated with food intake with concern for exacerbation of gastroparesis and possible constipation. Treat conservatively with over-the-counter laxatives to decreased risk for  constipation. If symptoms continue to occur consider addition of Reglan to stimulate gastric emptying. Follow-up if symptoms worsen or do not improve.        Endocrine   Diabetes mellitus type 2 in obese (HCC)    Type 2 diabetes remains well controlled with an office A1c of 5.9. Continue current dosage of Humalog, Basaglar, and metformin. Not currently maintained on ACE, ARB or Statin for CAD risk reduction. Urine microalbumin attempted with patient unable to provide urine sample. Continues to experience diabetic neuropathy as described. Encouraged to complete diabetic eye exam. Will continue to monitor.       Relevant Medications   metFORMIN (GLUCOPHAGE) 500 MG tablet   Insulin Glargine (BASAGLAR KWIKPEN) 100 UNIT/ML SOPN   Insulin Pen Needle 31G X 8 MM MISC   Other Relevant Orders   POCT HgB A1C (Completed)   Diabetic neuropathy (HCC) - Primary   Relevant Medications   metFORMIN (GLUCOPHAGE) 500 MG tablet   gabapentin (NEURONTIN) 300 MG capsule   pregabalin (LYRICA) 50 MG capsule   Insulin Glargine (BASAGLAR KWIKPEN) 100 UNIT/ML SOPN  traMADol (ULTRAM) 50 MG tablet       I have discontinued Ms. Shidler INSULIN SYRINGE .3CC/31GX5/16". I have also changed her [facility]. Additionally, I am having her start on Insulin Pen Needle. Lastly, I am having her maintain her albuterol, polyethylene glycol, torsemide, potassium chloride SA, fluticasone furoate-vilanterol, Tiotropium Bromide Monohydrate, roflumilast, pantoprazole, blood glucose meter kit and supplies, HUMALOG KWIKPEN, metFORMIN, gabapentin, pregabalin, and traMADol.   Meds ordered this encounter  Medications  . metFORMIN (GLUCOPHAGE) 500 MG tablet    Sig: Take 1 tablet (500 mg total) by mouth daily with breakfast.    Dispense:  90 tablet    Refill:  0  . gabapentin (NEURONTIN) 300 MG capsule    Sig: Take 2 capsules by mouth 3 times daily.    Dispense:  180 capsule    Refill:  2  . pregabalin (LYRICA) 50 MG capsule      Sig: Take 1 capsule (50 mg total) by mouth 3 (three) times daily.    Dispense:  90 capsule    Refill:  1  . Insulin Glargine (BASAGLAR KWIKPEN) 100 UNIT/ML SOPN    Sig: Inject 0.2 mLs (20 Units total) into the skin daily.    Dispense:  15 mL    Refill:  1  . traMADol (ULTRAM) 50 MG tablet    Sig: Take 1 tablet (50 mg total) by mouth every 6 (six) hours as needed for severe pain.    Dispense:  120 tablet    Refill:  0  . Insulin Pen Needle 31G X 8 MM MISC    Sig: Use 1 needle per test to test blood sugar 1-4 times daily as instructed.    Dispense:  100 each    Refill:  2    Substitution permissible per insurance coverage. Dx E11.9    Order Specific Question:  Supervising Provider    Answer:  Pricilla Holm A [[id number]]     Follow-up: No Follow-up on file.  Mauricio Po, FNP

## 2016-05-31 NOTE — Patient Instructions (Signed)
Thank you for choosing [facility].  Summary/Instructions:  Please continue to take your medications as prescribed.   Use the Miralax to prevent constipation.   Avoid acidic foods.  If your symptoms do not improve we may consider a brief trial of Reglan for gastroparesis.   Your prescription(s) have been submitted to your pharmacy or been printed and provided for you. Please take as directed and contact our office if you believe you are having problem(s) with the medication(s) or have any questions.  If your symptoms worsen or fail to improve, please contact our office for further instruction, or in case of emergency go directly to the emergency room at the closest medical facility.

## 2016-05-31 NOTE — Assessment & Plan Note (Signed)
Nausea with occasional vomiting associated with food intake with concern for exacerbation of gastroparesis and possible constipation. Treat conservatively with over-the-counter laxatives to decreased risk for constipation. If symptoms continue to occur consider addition of Reglan to stimulate gastric emptying. Follow-up if symptoms worsen or do not improve.

## 2016-05-31 NOTE — Assessment & Plan Note (Signed)
Type 2 diabetes remains well controlled with an office A1c of 5.9. Continue current dosage of Humalog, Basaglar, and metformin. Not currently maintained on ACE, ARB or Statin for CAD risk reduction. Urine microalbumin attempted with patient unable to provide urine sample. Continues to experience diabetic neuropathy as described. Encouraged to complete diabetic eye exam. Will continue to monitor.

## 2016-06-02 ENCOUNTER — Other Ambulatory Visit: Payer: Self-pay | Admitting: Internal Medicine

## 2016-06-11 ENCOUNTER — Ambulatory Visit: Payer: 59 | Admitting: Internal Medicine

## 2016-07-11 ENCOUNTER — Other Ambulatory Visit: Payer: Self-pay | Admitting: Internal Medicine

## 2016-07-12 ENCOUNTER — Other Ambulatory Visit: Payer: Self-pay

## 2016-07-12 DIAGNOSIS — E1142 Type 2 diabetes mellitus with diabetic polyneuropathy: Secondary | ICD-10-CM

## 2016-07-12 NOTE — Telephone Encounter (Signed)
Pt called requesting refill of Tramadol, please advise. Thanks

## 2016-07-13 MED ORDER — TRAMADOL HCL 50 MG PO TABS: 50.0000 mg | ORAL_TABLET | Freq: Four times a day (QID) | ORAL | 0 refills | Status: DC | PRN

## 2016-07-13 NOTE — Telephone Encounter (Signed)
Medication printed to be faxed.  

## 2016-07-13 NOTE — Telephone Encounter (Signed)
Medication faxed

## 2016-07-18 ENCOUNTER — Emergency Department (HOSPITAL_COMMUNITY): Payer: 59

## 2016-07-18 ENCOUNTER — Inpatient Hospital Stay (HOSPITAL_COMMUNITY)
Admission: EM | Admit: 2016-07-18 | Discharge: 2016-07-22 | DRG: 291 | Disposition: A | Discharge status: Home / Self Care | Payer: 59 | Attending: Internal Medicine | Admitting: Internal Medicine

## 2016-07-18 ENCOUNTER — Encounter (HOSPITAL_COMMUNITY): Payer: Self-pay | Admitting: *Deleted

## 2016-07-18 DIAGNOSIS — Z86718 Personal history of other venous thrombosis and embolism: Secondary | ICD-10-CM

## 2016-07-18 DIAGNOSIS — J441 Chronic obstructive pulmonary disease with (acute) exacerbation: Secondary | ICD-10-CM | POA: Diagnosis present

## 2016-07-18 DIAGNOSIS — J9621 Acute and chronic respiratory failure with hypoxia: Secondary | ICD-10-CM | POA: Diagnosis present

## 2016-07-18 DIAGNOSIS — I509 Heart failure, unspecified: Secondary | ICD-10-CM | POA: Diagnosis not present

## 2016-07-18 DIAGNOSIS — Z9981 Dependence on supplemental oxygen: Secondary | ICD-10-CM

## 2016-07-18 DIAGNOSIS — I5033 Acute on chronic diastolic (congestive) heart failure: Secondary | ICD-10-CM | POA: Diagnosis not present

## 2016-07-18 DIAGNOSIS — J9612 Chronic respiratory failure with hypercapnia: Secondary | ICD-10-CM | POA: Diagnosis not present

## 2016-07-18 DIAGNOSIS — Z79899 Other long term (current) drug therapy: Secondary | ICD-10-CM

## 2016-07-18 DIAGNOSIS — G4733 Obstructive sleep apnea (adult) (pediatric): Secondary | ICD-10-CM | POA: Diagnosis present

## 2016-07-18 DIAGNOSIS — Z6836 Body mass index (BMI) 36.0-36.9, adult: Secondary | ICD-10-CM

## 2016-07-18 DIAGNOSIS — J438 Other emphysema: Secondary | ICD-10-CM | POA: Diagnosis present

## 2016-07-18 DIAGNOSIS — E119 Type 2 diabetes mellitus without complications: Secondary | ICD-10-CM

## 2016-07-18 DIAGNOSIS — R0602 Shortness of breath: Secondary | ICD-10-CM | POA: Diagnosis not present

## 2016-07-18 DIAGNOSIS — Z794 Long term (current) use of insulin: Secondary | ICD-10-CM

## 2016-07-18 DIAGNOSIS — E1142 Type 2 diabetes mellitus with diabetic polyneuropathy: Secondary | ICD-10-CM | POA: Diagnosis present

## 2016-07-18 DIAGNOSIS — K219 Gastro-esophageal reflux disease without esophagitis: Secondary | ICD-10-CM

## 2016-07-18 DIAGNOSIS — I50812 Chronic right heart failure: Secondary | ICD-10-CM

## 2016-07-18 DIAGNOSIS — J9690 Respiratory failure, unspecified, unspecified whether with hypoxia or hypercapnia: Secondary | ICD-10-CM | POA: Diagnosis present

## 2016-07-18 DIAGNOSIS — R748 Abnormal levels of other serum enzymes: Secondary | ICD-10-CM

## 2016-07-18 DIAGNOSIS — Z87891 Personal history of nicotine dependence: Secondary | ICD-10-CM

## 2016-07-18 DIAGNOSIS — Z86711 Personal history of pulmonary embolism: Secondary | ICD-10-CM

## 2016-07-18 DIAGNOSIS — R740 Nonspecific elevation of levels of transaminase and lactic acid dehydrogenase [LDH]: Secondary | ICD-10-CM | POA: Diagnosis present

## 2016-07-18 DIAGNOSIS — E669 Obesity, unspecified: Secondary | ICD-10-CM | POA: Diagnosis present

## 2016-07-18 DIAGNOSIS — E1169 Type 2 diabetes mellitus with other specified complication: Secondary | ICD-10-CM

## 2016-07-18 LAB — CBC WITH DIFFERENTIAL/PLATELET
BASOS ABS: 0 10*3/uL (ref 0.0–0.1)
BASOS PCT: 0 %
EOS ABS: 0.2 10*3/uL (ref 0.0–0.7)
EOS PCT: 2 %
HCT: 47.5 % — ABNORMAL HIGH (ref 36.0–46.0)
Hemoglobin: 14.9 g/dL (ref 12.0–15.0)
Lymphocytes Relative: 18 %
Lymphs Abs: 1.7 10*3/uL (ref 0.7–4.0)
MCH: 32.9 pg (ref 26.0–34.0)
MCHC: 31.4 g/dL (ref 30.0–36.0)
MCV: 104.9 fL — ABNORMAL HIGH (ref 78.0–100.0)
Monocytes Absolute: 0.9 10*3/uL (ref 0.1–1.0)
Monocytes Relative: 9 %
Neutro Abs: 6.8 10*3/uL (ref 1.7–7.7)
Neutrophils Relative %: 71 %
PLATELETS: 162 10*3/uL (ref 150–400)
RBC: 4.53 MIL/uL (ref 3.87–5.11)
RDW: 13.9 % (ref 11.5–15.5)
WBC: 9.6 10*3/uL (ref 4.0–10.5)

## 2016-07-18 LAB — BASIC METABOLIC PANEL
ANION GAP: 12 (ref 5–15)
BUN: 7 mg/dL (ref 6–20)
CO2: 34 mmol/L — ABNORMAL HIGH (ref 22–32)
Calcium: 9.1 mg/dL (ref 8.9–10.3)
Chloride: 89 mmol/L — ABNORMAL LOW (ref 101–111)
Creatinine, Ser: 0.54 mg/dL (ref 0.44–1.00)
Glucose, Bld: 154 mg/dL — ABNORMAL HIGH (ref 65–99)
POTASSIUM: 3.9 mmol/L (ref 3.5–5.1)
SODIUM: 135 mmol/L (ref 135–145)

## 2016-07-18 LAB — GLUCOSE, CAPILLARY
GLUCOSE-CAPILLARY: 227 mg/dL — AB (ref 65–99)
Glucose-Capillary: 221 mg/dL — ABNORMAL HIGH (ref 65–99)
Glucose-Capillary: 253 mg/dL — ABNORMAL HIGH (ref 65–99)

## 2016-07-18 LAB — BRAIN NATRIURETIC PEPTIDE: B Natriuretic Peptide: 100.3 pg/mL — ABNORMAL HIGH (ref 0.0–100.0)

## 2016-07-18 MED ORDER — ALBUTEROL SULFATE (2.5 MG/3ML) 0.083% IN NEBU
2.5000 mg | INHALATION_SOLUTION | RESPIRATORY_TRACT | Status: DC | PRN
  Administered 2016-07-20: 2.5 mg via RESPIRATORY_TRACT
  Filled 2016-07-18: qty 3

## 2016-07-18 MED ORDER — ALBUTEROL (5 MG/ML) CONTINUOUS INHALATION SOLN
5.0000 mg/h | INHALATION_SOLUTION | RESPIRATORY_TRACT | Status: DC
  Administered 2016-07-18: 5 mg/h via RESPIRATORY_TRACT
  Filled 2016-07-18: qty 20

## 2016-07-18 MED ORDER — GUAIFENESIN ER 600 MG PO TB12
600.0000 mg | ORAL_TABLET | Freq: Two times a day (BID) | ORAL | Status: DC
  Administered 2016-07-18 – 2016-07-22 (×9): 600 mg via ORAL
  Filled 2016-07-18 (×9): qty 1

## 2016-07-18 MED ORDER — SENNOSIDES-DOCUSATE SODIUM 8.6-50 MG PO TABS
1.0000 | ORAL_TABLET | Freq: Every evening | ORAL | Status: DC | PRN
Start: 2016-07-18 — End: 2016-07-22
  Filled 2016-07-18: qty 1

## 2016-07-18 MED ORDER — TORSEMIDE 20 MG PO TABS
20.0000 mg | ORAL_TABLET | Freq: Every day | ORAL | Status: DC
  Administered 2016-07-18 – 2016-07-22 (×5): 20 mg via ORAL
  Filled 2016-07-18 (×5): qty 1

## 2016-07-18 MED ORDER — ENOXAPARIN SODIUM 40 MG/0.4ML ~~LOC~~ SOLN
40.0000 mg | SUBCUTANEOUS | Status: DC
  Administered 2016-07-18 – 2016-07-21 (×4): 40 mg via SUBCUTANEOUS
  Filled 2016-07-18 (×6): qty 0.4

## 2016-07-18 MED ORDER — LEVOFLOXACIN IN D5W 750 MG/150ML IV SOLN: 750.0000 mg | INTRAVENOUS | Status: DC

## 2016-07-18 MED ORDER — ALBUTEROL SULFATE HFA 108 (90 BASE) MCG/ACT IN AERS: 2.0000 | INHALATION_SPRAY | Freq: Four times a day (QID) | RESPIRATORY_TRACT | Status: DC | PRN

## 2016-07-18 MED ORDER — IPRATROPIUM BROMIDE 0.02 % IN SOLN: 0.5000 mg | RESPIRATORY_TRACT | Status: DC | PRN

## 2016-07-18 MED ORDER — MAGNESIUM CITRATE PO SOLN: 1.0000 | Freq: Once | ORAL | Status: DC | PRN

## 2016-07-18 MED ORDER — PREDNISONE 50 MG PO TABS
60.0000 mg | ORAL_TABLET | Freq: Every day | ORAL | Status: DC
  Administered 2016-07-18 – 2016-07-22 (×5): 60 mg via ORAL
  Filled 2016-07-18 (×5): qty 1

## 2016-07-18 MED ORDER — PANTOPRAZOLE SODIUM 40 MG PO TBEC
40.0000 mg | DELAYED_RELEASE_TABLET | Freq: Two times a day (BID) | ORAL | Status: DC
  Administered 2016-07-18 – 2016-07-22 (×10): 40 mg via ORAL
  Filled 2016-07-18 (×10): qty 1

## 2016-07-18 MED ORDER — IPRATROPIUM-ALBUTEROL 0.5-2.5 (3) MG/3ML IN SOLN
3.0000 mL | RESPIRATORY_TRACT | Status: DC
  Administered 2016-07-18 – 2016-07-19 (×6): 3 mL via RESPIRATORY_TRACT
  Filled 2016-07-18 (×6): qty 3

## 2016-07-18 MED ORDER — PREGABALIN 25 MG PO CAPS
50.0000 mg | ORAL_CAPSULE | Freq: Three times a day (TID) | ORAL | Status: DC
  Administered 2016-07-18 – 2016-07-22 (×14): 50 mg via ORAL
  Filled 2016-07-18: qty 1
  Filled 2016-07-18 (×13): qty 2

## 2016-07-18 MED ORDER — MAGNESIUM SULFATE 2 GM/50ML IV SOLN
2.0000 g | Freq: Once | INTRAVENOUS | Status: AC
  Administered 2016-07-18: 2 g via INTRAVENOUS
  Filled 2016-07-18: qty 50

## 2016-07-18 MED ORDER — ACETAMINOPHEN 325 MG PO TABS
650.0000 mg | ORAL_TABLET | Freq: Four times a day (QID) | ORAL | Status: DC | PRN
  Administered 2016-07-20: 650 mg via ORAL
  Filled 2016-07-18: qty 2

## 2016-07-18 MED ORDER — INSULIN ASPART 100 UNIT/ML ~~LOC~~ SOLN
0.0000 [IU] | Freq: Three times a day (TID) | SUBCUTANEOUS | Status: DC
  Administered 2016-07-18 – 2016-07-19 (×4): 3 [IU] via SUBCUTANEOUS
  Administered 2016-07-19 – 2016-07-20 (×2): 2 [IU] via SUBCUTANEOUS
  Administered 2016-07-20 – 2016-07-21 (×3): 3 [IU] via SUBCUTANEOUS
  Administered 2016-07-21: 1 [IU] via SUBCUTANEOUS
  Administered 2016-07-22: 2 [IU] via SUBCUTANEOUS

## 2016-07-18 MED ORDER — IPRATROPIUM-ALBUTEROL 0.5-2.5 (3) MG/3ML IN SOLN: 3.0000 mL | RESPIRATORY_TRACT | Status: DC | PRN

## 2016-07-18 MED ORDER — ONDANSETRON HCL 4 MG/2ML IJ SOLN: 4.0000 mg | Freq: Four times a day (QID) | INTRAMUSCULAR | Status: DC | PRN

## 2016-07-18 MED ORDER — ONDANSETRON HCL 4 MG PO TABS: 4.0000 mg | ORAL_TABLET | Freq: Four times a day (QID) | ORAL | Status: DC | PRN

## 2016-07-18 MED ORDER — TRAZODONE HCL 50 MG PO TABS
25.0000 mg | ORAL_TABLET | Freq: Every evening | ORAL | Status: DC | PRN
Start: 2016-07-18 — End: 2016-07-22

## 2016-07-18 MED ORDER — ACETAMINOPHEN 650 MG RE SUPP: 650.0000 mg | Freq: Four times a day (QID) | RECTAL | Status: DC | PRN

## 2016-07-18 MED ORDER — HYDROCODONE-ACETAMINOPHEN 5-325 MG PO TABS
1.0000 | ORAL_TABLET | ORAL | Status: DC | PRN
  Administered 2016-07-18 – 2016-07-21 (×10): 1 via ORAL
  Administered 2016-07-21 – 2016-07-22 (×4): 2 via ORAL
  Filled 2016-07-18 (×2): qty 1
  Filled 2016-07-18: qty 2
  Filled 2016-07-18: qty 1
  Filled 2016-07-18: qty 2
  Filled 2016-07-18 (×4): qty 1
  Filled 2016-07-18: qty 2
  Filled 2016-07-18 (×5): qty 1

## 2016-07-18 MED ORDER — IPRATROPIUM BROMIDE 0.02 % IN SOLN
0.5000 mg | Freq: Once | RESPIRATORY_TRACT | Status: AC
  Administered 2016-07-18: 0.5 mg via RESPIRATORY_TRACT
  Filled 2016-07-18: qty 2.5

## 2016-07-18 MED ORDER — BISACODYL 10 MG RE SUPP: 10.0000 mg | Freq: Every day | RECTAL | Status: DC | PRN

## 2016-07-18 MED ORDER — TIOTROPIUM BROMIDE MONOHYDRATE 18 MCG IN CAPS
18.0000 ug | ORAL_CAPSULE | Freq: Every day | RESPIRATORY_TRACT | Status: DC
  Administered 2016-07-20 – 2016-07-21 (×2): 18 ug via RESPIRATORY_TRACT
  Filled 2016-07-18: qty 5

## 2016-07-18 MED ORDER — ROFLUMILAST 500 MCG PO TABS
500.0000 ug | ORAL_TABLET | Freq: Every day | ORAL | Status: DC
  Administered 2016-07-18 – 2016-07-22 (×5): 500 ug via ORAL
  Filled 2016-07-18 (×5): qty 1

## 2016-07-18 MED ORDER — FLUTICASONE FUROATE-VILANTEROL 100-25 MCG/INH IN AEPB: 1.0000 | INHALATION_SPRAY | Freq: Every day | RESPIRATORY_TRACT | Status: DC

## 2016-07-18 NOTE — ED Provider Notes (Signed)
[place] DEPT Provider Note   CSN: [id number] Arrival date & time: 07/18/16  9201  First Provider Contact:  First MD Initiated Contact with Patient 07/18/16 0345   By signing my name below, I, Georgette Shell, attest that this documentation has been prepared under the direction and in the presence of Everlene Balls, MD. Electronically Signed: Georgette Shell, ED Scribe. 07/18/16. 4:01 AM. History   Chief Complaint Chief Complaint  Patient presents with  . Shortness of Breath   HPI Comments: Mary [name] is a 40 y.o. female with h/o asthma, COPD, and CHF who presents to the Emergency Department by EMS complaining of sudden onset, constant shortness of breath onset one day ago. Pt also has associated fatigue. Pt is normally on 3L [place] at home with O2 sat of 92%. Pt has shortness of breath at baseline due to her PMHx but notes this is worse. Pt was given a duoneb, Denies fever. Her O2 sat was 74% on her home O2 at home.  She then called 911 for help.    The history is provided by the patient. No language interpreter was used.    Past Medical History:  Diagnosis Date  . Axillary adenopathy    right axillary adenopathy noted on CT chest (03/06/2012)  . Chronic right-sided CHF (congestive heart failure) ([place]) 03/23/2012   with cor pulmonale. Last RHC 03/2012  . COPD (chronic obstructive pulmonary disease) ([place])    PFTS 03/08/12: fev1 0.58L/1%, FVC 1.18/33%, Ratop 49 and c/w ssevere obstruction. 21% BD response on FVC, RV 219%, DLCO 11/54%  . DVT of upper extremity (deep vein thrombosis) [facility]) April 2013   right subclavian // Unclear precipitating cause - possibly significant right heart failure, with vascular stasis potentially predisposing to clotting.    . Exertional shortness of breath   . Hepatic cirrhosis ([place])    Questionable history of - Noted on CT abdomen (03/2012) - thought to be due to vascular congestion from right heart failure +/- patient's history of alcohol abuse  . History of  alcoholism ([place])   . History of chronic bronchitis   . Irregular menses   . Migraine    "~ 1/yr" (05/03/2013)  . Moderate to severe pulmonary hypertension [facility]) April 2013   Cardiac cath on 03/06/12 - 1. Elevated pulmonary artery pressures, right sided filling pressures.,  2. PA: 64/45 (mean 53)    . On home oxygen therapy    "2-3 L 24/7" (05/03/2013)  . OSA (obstructive sleep apnea)    wears noctural BiPAP (05/03/2013)  . Periodontal disease   . Pneumonia ~ 1985; 01/2011  . Pulmonary embolus [facility]) April 2013   Precipitating cause unclear. Was treated with coumadin from April-June 2013.    Patient Active Problem List   Diagnosis Date Noted  . Nausea with vomiting 05/31/2016  . Diabetic neuropathy ([place]) 04/30/2016  . Diabetes mellitus type 2 in obese ([place]) 04/10/2016  . SOB (shortness of breath)   . OSA treated with BiPAP 04/02/2016  . COPD exacerbation ([place]) 04/01/2016  . Chronic diastolic heart failure ([place]) 01/25/2016  . Chronic respiratory failure with hypoxia ([place]) 01/21/2016  . Right heart failure ([place])   . PAH (pulmonary artery hypertension) ([place])   . COPD (chronic obstructive pulmonary disease) ([place]) 12/16/2015  . Constipation 11/18/2015  . Morbid obesity ([place]) 11/18/2015  . Lung nodule 01/30/2015  . Pulmonary mass 01/13/2015  . Other emphysema ([place])   . Gastroparesis   . Esophageal reflux   . Obstipation   . Dehydration  12/14/2014  . Abdominal pain 12/14/2014  . Fever 12/14/2014  . Dyspnea 09/18/2013  . Hypokalemia 02/28/2013  . Acute on chronic respiratory failure with hypoxia ([place]) 02/28/2013  . Financial difficulties 02/28/2013  . Obesity hypoventilation syndrome ([place]) 02/28/2013  . Elevated troponin 02/28/2013  . Myalgia 02/28/2013  . Influenza with respiratory manifestations 02/28/2013  . Anovulation 06/30/2012  . Preventative health care 05/05/2012  . Irregular menstrual cycle 04/27/2012  . Chronic right-sided CHF (congestive heart failure) ([place]) 03/23/2012   . Pulmonary hypertension ([place]) 03/23/2012  . Cor pulmonale ([place]) 03/21/2012  . Chronic respiratory failure with hypercapnia ([place]) 03/21/2012  . OSA on BiPAP 03/19/2012  . Axillary lymphadenopathy 03/06/2012  . Congenital heart disease 01/28/2012  . Obesity 01/28/2012  . Dental caries 01/28/2012    Past Surgical History:  Procedure Laterality Date  . APPENDECTOMY  05/03/2013  . CARDIAC CATHETERIZATION  03/2012  . CARDIAC CATHETERIZATION N/A 01/08/2016   Procedure: Right Heart Cath;  Surgeon: Jolaine Artist, MD;  Location: [place] [facility];  Service: Cardiovascular;  Laterality: N/A;  . CARDIAC SURGERY  Jan 05, 1977   "my heart was backwards" (05/03/2013)  . LAPAROSCOPIC APPENDECTOMY N/A 05/03/2013   Procedure: APPENDECTOMY LAPAROSCOPIC;  Surgeon: Gwenyth Ober, MD;  Location: [facility];  Service: General;  Laterality: N/A;  . LUNG SURGERY  Aug 10, 1977  . RIGHT HEART CATHETERIZATION N/A 03/07/2012   Procedure: RIGHT HEART CATH;  Surgeon: Burnell Blanks, MD;  Location: [facility];  Service: Cardiovascular;  Laterality: N/A;    OB History    No data available       Home Medications    Prior to Admission medications   Medication Sig Start Date End Date Taking? Authorizing Provider  albuterol (PROVENTIL HFA;VENTOLIN HFA) 108 (90 BASE) MCG/ACT inhaler Inhale 2 puffs into the lungs every 6 (six) hours as needed for wheezing or shortness of breath. 09/06/14  Yes Brand Males, MD  BREO ELLIPTA 100-25 MCG/INH AEPB INHALE 1 PUFF INTO THE LUNGS DAILY. 06/03/16  Yes Brand Males, MD  pantoprazole (PROTONIX) 40 MG tablet Take 1 tablet (40 mg total) by mouth 2 (two) times daily before a meal. 04/10/16  Yes Janece Canterbury, MD  pregabalin (LYRICA) 50 MG capsule Take 1 capsule (50 mg total) by mouth 3 (three) times daily. 05/31/16  Yes Golden Circle, FNP  roflumilast (DALIRESP) 500 MCG TABS tablet Take 500 mcg by mouth daily.   Yes Historical Provider, MD  Tiotropium Bromide Monohydrate  (SPIRIVA RESPIMAT) 1.25 MCG/ACT AERS Inhale 2 puffs into the lungs 2 (two) times daily. Patient taking differently: Inhale 2 puffs into the lungs daily.  02/02/16  Yes Brand Males, MD  torsemide (DEMADEX) 20 MG tablet Take 1 tablet (20 mg total) by mouth 2 (two) times daily. Patient taking differently: Take 20 mg by mouth daily.  01/09/16  Yes Cherene Altes, MD  traMADol (ULTRAM) 50 MG tablet Take 1 tablet (50 mg total) by mouth every 6 (six) hours as needed for severe pain. 07/13/16  Yes Golden Circle, FNP  blood glucose meter kit and supplies KIT Dispense based on patient and insurance preference. Use up to four times daily as directed. (FOR ICD-9 250.00, 250.01). 04/11/16   Janece Canterbury, MD  DALIRESP 500 MCG TABS tablet FIRST WEEK TAKE 1 TABLET EVERY OTHER DAY THEN 1 TABLET BY MOUTH DAILY THEREAFTER Patient not taking: Reported on 07/18/2016 07/12/16   Brand Males, MD  gabapentin (NEURONTIN) 300 MG capsule Take 2 capsules by mouth 3 times  daily. Patient not taking: Reported on 07/18/2016 05/31/16   Gold[place] Circle, FNP  Insulin Glargine (BASAGLAR KWIKPEN) 100 UNIT/ML SOPN Inject 0.2 mLs (20 Units total) into the skin daily. Patient not taking: Reported on 07/18/2016 05/31/16   Gold[place] Circle, FNP  Insulin Pen Needle 31G X 8 MM MISC Use 1 needle per test to test blood sugar 1-4 times daily as instructed. 05/31/16   Golden Circle, FNP  metFORMIN (GLUCOPHAGE) 500 MG tablet Take 1 tablet (500 mg total) by mouth daily with breakfast. Patient not taking: Reported on 07/18/2016 05/31/16   [place] Circle, FNP  potassium chloride SA (K-DUR,KLOR-CON) 20 MEQ tablet Take 1 tablet (20 mEq total) by mouth daily. Patient not taking: Reported on 07/18/2016 01/29/16   Jolaine Artist, MD    Family History Family History  Problem Relation Age of Onset  . Hypertension Father   . Heart disease Father     CHF; died age 78   . Alcohol abuse Father   . Other Brother     died age 41 y.o overdose     Social History Social History  Substance Use Topics  . Smoking status: Former Smoker    Packs/day: 1.00    Years: 25.00    Types: Cigarettes    Quit date: 02/18/2012  . Smokeless tobacco: Never Used  . Alcohol use No     Comment: previously drinking 1 pint 3-4 days a week for 2002-2013, but in 2014, no longer drinking     Allergies   Review of patient's allergies indicates no known allergies.   Review of Systems Review of Systems 10 Systems reviewed and all are negative for acute change except as noted in the HPI. Physical Exam Updated Vital Signs BP 114/76   Pulse 101   Temp 98.1 F (36.7 C)   Resp 23   SpO2 95%   Physical Exam  Constitutional: She is oriented to person, place, and time. She appears well-developed and well-nourished. No distress.  HENT:  Head: Normocephalic and atraumatic.  Nose: Nose normal.  Mouth/Throat: Oropharynx is clear and moist. No oropharyngeal exudate.  Eyes: Conjunctivae and EOM are normal. Pupils are equal, round, and reactive to light. No scleral icterus.  Neck: Normal range of motion. Neck supple. No JVD present. No tracheal deviation present. No thyromegaly present.  Cardiovascular: Regular rhythm and normal heart sounds.  Tachycardia present.  Exam reveals no gallop and no friction rub.   No murmur heard. Pulmonary/Chest: Effort normal. She has wheezes. She exhibits no tenderness.  Nasal canula in place. Tachypnea. Increased work of breathing. Decreased breath sounds. Wheezing.   Abdominal: Soft. Bowel sounds are normal. She exhibits no distension and no mass. There is no tenderness. There is no rebound and no guarding.  Musculoskeletal: Normal range of motion. She exhibits no edema or tenderness.  Lymphadenopathy:    She has no cervical adenopathy.  Neurological: She is alert and oriented to person, place, and time. No cranial nerve deficit. She exhibits normal muscle tone.  Skin: Skin is warm and dry. No rash noted. No erythema.  No pallor.  Nursing note and vitals reviewed.    ED Treatments / Results  DIAGNOSTIC STUDIES: Oxygen Saturation is 83% on [place] 3L, poor by my interpretation.    COORDINATION OF CARE: 3:56 AM Discussed treatment plan with pt at bedside which includes breathing treatments and pt agreed to plan.  Labs (all labs ordered are listed, but only abnormal results are displayed) Labs Reviewed  CBC WITH DIFFERENTIAL/PLATELET - Abnormal; Notable for the following:       Result Value   HCT 47.5 (*)    MCV 104.9 (*)    All other components within normal limits  BASIC METABOLIC PANEL - Abnormal; Notable for the following:    Chloride 89 (*)    CO2 34 (*)    Glucose, Bld 154 (*)    All other components within normal limits  BRAIN NATRIURETIC PEPTIDE - Abnormal; Notable for the following:    B Natriuretic Peptide 100.3 (*)    All other components within normal limits    EKG  EKG Interpretation  Date/Time:  Sunday July 18 2016 04:05:54 EDT Ventricular Rate:  111 PR Interval:    QRS Duration: 81 QT Interval:  340 QTC Calculation: 462 R Axis:   102 Text Interpretation:  Sinus tachycardia Biatrial enlargement Probable lateral infarct, old No significant change since last tracing Confirmed by Glynn Octave [phone]) on 07/18/2016 5:00:13 AM       Radiology Dg Chest 2 View  Result Date: 07/18/2016 CLINICAL DATA:  Acute onset of shortness of breath. Initial encounter. EXAM: CHEST  2 VIEW COMPARISON:  Chest radiograph performed 04/05/2016 FINDINGS: The lungs are well-aerated. Mild vascular congestion is noted. Mild bibasilar opacities may reflect mild interstitial edema. No significant pleural effusion or pneumothorax is seen. The heart is borderline normal in size. No acute osseous abnormalities are seen. IMPRESSION: Mild vascular congestion noted. Mild bibasilar opacities may reflect mild interstitial edema. Electronically Signed   By: Garald Balding M.D.   On: 07/18/2016 04:33     Procedures Procedures (including critical care time)  Medications Ordered in ED Medications  albuterol (PROVENTIL,VENTOLIN) solution continuous neb (5 mg/hr Nebulization New Bag/Given 07/18/16 0410)  magnesium sulfate IVPB 2 g 50 mL (2 g Intravenous New Bag/Given 07/18/16 0441)  ipratropium (ATROVENT) nebulizer solution 0.5 mg (0.5 mg Nebulization Given 07/18/16 0410)     Initial Impression / Assessment and Plan / ED Course  I have reviewed the triage vital signs and the nursing notes.  Pertinent labs & imaging results that were available during my care of the patient were reviewed by me and considered in my medical decision making (see chart for details).  Clinical Course  Patient presents to the ED for SOB and COPD exacerbation.  She was hypoxic on her home O2.  Now, on 4-5L [place] she is in the low 90s.  She was given duoneb and solumedrol by EMS.  I gave her more albuterol and magnesium in the ED.  CXR negative for occult infection.  She continues to be tachypnic with inc WOB on exam.  Will admit for further treatment.   CRITICAL CARE Performed by: Everlene Balls   Total critical care time: 40 minutes - respiratory distress  Critical care time was exclusive of separately billable procedures and treating other patients.  Critical care was necessary to treat or prevent imminent or life-threatening deterioration.  Critical care was time spent personally by me on the following activities: development of treatment plan with patient and/or surrogate as well as nursing, discussions with consultants, evaluation of patient's response to treatment, examination of patient, obtaining history from patient or surrogate, ordering and performing treatments and interventions, ordering and review of laboratory studies, ordering and review of radiographic studies, pulse oximetry and re-evaluation of patient's condition.     Final Clinical Impressions(s) / ED Diagnoses   Final diagnoses:  None     New Prescriptions New Prescriptions   No  medications on file    I personally performed the services described in this documentation, which was scribed in my presence. The recorded information has been reviewed and is accurate.       Everlene Balls, MD 07/18/16 0530

## 2016-07-18 NOTE — Progress Notes (Signed)
Many alerts were received  from CCMD, patient' saturation going down in 70s and 80s. Patient was checked and she didn"t complain of any acute distress. Any time when she is moving her saturation is going down. She was encouraged to breath through her nose, to cough, and to use incentive spirometer. Will continue to monitor.

## 2016-07-18 NOTE — H&P (Signed)
History and Physical    Mary [name] WPY:[id number] DOB: Aug 17, 1976 DOA: 07/18/2016   PCP: Mauricio Po, FNP   Patient coming from:  Home  Chief Complaint: Shortness of breath   HPI: Mary [name] is a 40 y.o. female with medical history significant for COPD-asthma on oxygen at 2-3 L continuous,obstructive sleep apnea on BiPAP, history of DVT of the upper extremity/ PE not on anticoagulants, chronic right CHF, history of hepatic alcoholic cirrhosis,presenting to the ED with sudden onset of constant shortness of breath over the last 24 hours associated with fatigue.She reports this my have been triggered by the barbeque's smoke, "irritating the lungs" Her home O2 dropped to 74% for which she called EMS. En  route, she received 125 mg Solu-Medrol, with albuterol Denies fevers, chills, night sweats, vision changes, or mucositis. Denies any chest pain or palpitations. Denies lower extremity swelling. Denies nausea, heartburn or change in bowel habits.. Appetite is normal. Denies any dysuria. Denies abnormal skin rashes, or neuropathy. Denies any bleeding issues such as epistaxis, hematemesis, hematuria or hematochezia. Ambulating without difficulty.    ED Course:  BP 115/78   Pulse 106   Temp 98.1 F (36.7 C)   Resp 22   SpO2 (!) 89%  . She was to keep make, with increased work of breathing.her O2 was increased to 4-5 L nasal cannula.  received DuoNeb and magnesium here with better control of her symptoms. Chest x-ray was negative for occult infection. White count 9.6, hemoglobin 14.9. Glucose 154 BNP 100.3.  EKG with sinus tachycardia, without significant changes since last tracing. Q TC 462 Review of Systems: As per HPI otherwise 10 point review of systems negative.   Past Medical History:  Diagnosis Date  . Axillary adenopathy    right axillary adenopathy noted on CT chest (03/06/2012)  . Chronic right-sided CHF (congestive heart failure) ([place]) 03/23/2012   with cor  pulmonale. Last RHC 03/2012  . COPD (chronic obstructive pulmonary disease) ([place])    PFTS 03/08/12: fev1 0.58L/1%, FVC 1.18/33%, Ratop 49 and c/w ssevere obstruction. 21% BD response on FVC, RV 219%, DLCO 11/54%  . DVT of upper extremity (deep vein thrombosis) [facility]) April 2013   right subclavian // Unclear precipitating cause - possibly significant right heart failure, with vascular stasis potentially predisposing to clotting.    . Exertional shortness of breath   . Hepatic cirrhosis ([place])    Questionable history of - Noted on CT abdomen (03/2012) - thought to be due to vascular congestion from right heart failure +/- patient's history of alcohol abuse  . History of alcoholism ([place])   . History of chronic bronchitis   . Irregular menses   . Migraine    "~ 1/yr" (05/03/2013)  . Moderate to severe pulmonary hypertension [facility]) April 2013   Cardiac cath on 03/06/12 - 1. Elevated pulmonary artery pressures, right sided filling pressures.,  2. PA: 64/45 (mean 53)    . On home oxygen therapy    "2-3 L 24/7" (05/03/2013)  . OSA (obstructive sleep apnea)    wears noctural BiPAP (05/03/2013)  . Periodontal disease   . Pneumonia ~ 1985; 01/2011  . Pulmonary embolus [facility]) April 2013   Precipitating cause unclear. Was treated with coumadin from April-June 2013.    Past Surgical History:  Procedure Laterality Date  . APPENDECTOMY  05/03/2013  . CARDIAC CATHETERIZATION  03/2012  . CARDIAC CATHETERIZATION N/A 01/08/2016   Procedure: Right Heart Cath;  Surgeon: Jolaine Artist, MD;  Location: [place]  CV LAB;  Service: Cardiovascular;  Laterality: N/A;  . CARDIAC SURGERY  Nov 16, 1976   "my heart was backwards" (05/03/2013)  . LAPAROSCOPIC APPENDECTOMY N/A 05/03/2013   Procedure: APPENDECTOMY LAPAROSCOPIC;  Surgeon: Gwenyth Ober, MD;  Location: [place];  Service: General;  Laterality: N/A;  . LUNG SURGERY  02-12-77  . RIGHT HEART CATHETERIZATION N/A 03/07/2012   Procedure: RIGHT HEART CATH;  Surgeon:  Burnell Blanks, MD;  Location: [facility] CATH LAB;  Service: Cardiovascular;  Laterality: N/A;    Social History Social History   Social History  . Marital status: Married    Spouse name: N/A  . Number of children: 0  . Years of education: 14   Occupational History  . Unemployed [employer]   Social History Main Topics  . Smoking status: Former Smoker    Packs/day: 1.00    Years: 25.00    Types: Cigarettes    Quit date: 02/18/2012  . Smokeless tobacco: Never Used  . Alcohol use No     Comment: previously drinking 1 pint 3-4 days a week for 2002-2013, but in 2014, no longer drinking  . Drug use: No  . Sexual activity: Yes    Partners: Male   Other Topics Concern  . Not on file   Social History Narrative   Lives with husband in [place]   Has a farm with multiple animals including chickens, [place] pigs, cockatil (bird), dogs and cats   Completed GED and 2 years of college   Smoking-quit smoking >1 year since 2014   Not working    Fun: Health visitor with her dog and go fishing     No Known Allergies  Family History  Problem Relation Age of Onset  . Hypertension Father   . Heart disease Father     CHF; died age 15   . Alcohol abuse Father   . Other Brother     died age 71 y.o overdose      Prior to Admission medications   Medication Sig Start Date End Date Taking? Authorizing Provider  albuterol (PROVENTIL HFA;VENTOLIN HFA) 108 (90 BASE) MCG/ACT inhaler Inhale 2 puffs into the lungs every 6 (six) hours as needed for wheezing or shortness of breath. 09/06/14  Yes Brand Males, MD  BREO ELLIPTA 100-25 MCG/INH AEPB INHALE 1 PUFF INTO THE LUNGS DAILY. 06/03/16  Yes Brand Males, MD  pantoprazole (PROTONIX) 40 MG tablet Take 1 tablet (40 mg total) by mouth 2 (two) times daily before a meal. 04/10/16  Yes Janece Canterbury, MD  pregabalin (LYRICA) 50 MG capsule Take 1 capsule (50 mg total) by mouth 3 (three) times daily. 05/31/16  Yes  Golden Circle, FNP  roflumilast (DALIRESP) 500 MCG TABS tablet Take 500 mcg by mouth daily.   Yes Historical Provider, MD  Tiotropium Bromide Monohydrate (SPIRIVA RESPIMAT) 1.25 MCG/ACT AERS Inhale 2 puffs into the lungs 2 (two) times daily. Patient taking differently: Inhale 2 puffs into the lungs daily.  02/02/16  Yes Brand Males, MD  torsemide (DEMADEX) 20 MG tablet Take 1 tablet (20 mg total) by mouth 2 (two) times daily. Patient taking differently: Take 20 mg by mouth daily.  01/09/16  Yes Cherene Altes, MD  traMADol (ULTRAM) 50 MG tablet Take 1 tablet (50 mg total) by mouth every 6 (six) hours as needed for severe pain. 07/13/16  Yes Golden Circle, FNP  blood glucose meter kit and supplies KIT Dispense based on patient and insurance preference.  Use up to four times daily as directed. (FOR ICD-9 250.00, 250.01). 04/11/16   Janece Canterbury, MD  DALIRESP 500 MCG TABS tablet FIRST WEEK TAKE 1 TABLET EVERY OTHER DAY THEN 1 TABLET BY MOUTH DAILY THEREAFTER Patient not taking: Reported on 07/18/2016 07/12/16   Brand Males, MD  gabapentin (NEURONTIN) 300 MG capsule Take 2 capsules by mouth 3 times daily. Patient not taking: Reported on 07/18/2016 05/31/16   Golden Circle, FNP  Insulin Glargine (BASAGLAR KWIKPEN) 100 UNIT/ML SOPN Inject 0.2 mLs (20 Units total) into the skin daily. Patient not taking: Reported on 07/18/2016 05/31/16   Golden Circle, FNP  Insulin Pen Needle 31G X 8 MM MISC Use 1 needle per test to test blood sugar 1-4 times daily as instructed. 05/31/16   Golden Circle, FNP  metFORMIN (GLUCOPHAGE) 500 MG tablet Take 1 tablet (500 mg total) by mouth daily with breakfast. Patient not taking: Reported on 07/18/2016 05/31/16   Golden Circle, FNP  potassium chloride SA (K-DUR,KLOR-CON) 20 MEQ tablet Take 1 tablet (20 mEq total) by mouth daily. Patient not taking: Reported on 07/18/2016 01/29/16   Jolaine Artist, MD    Physical Exam:    Vitals:   07/18/16 0500  07/18/16 0530 07/18/16 0600 07/18/16 0630  BP: 119/72 123/75 117/78 115/78  Pulse: 108 108 110 106  Resp: 22 23 22 22   Temp:      SpO2: 97% 97% (!) 88% (!) 89%       Constitutional: NAD, calm, comfortable , disheveled, unkempt appearance  Vitals:   07/18/16 0500 07/18/16 0530 07/18/16 0600 07/18/16 0630  BP: 119/72 123/75 117/78 115/78  Pulse: 108 108 110 106  Resp: 22 23 22 22   Temp:      SpO2: 97% 97% (!) 88% (!) 89%   Eyes: PERRL, lids and conjunctivae normal ENMT: Mucous membranes are moist. Posterior pharynx clear of any exudate or lesions.  Neck: normal, supple, no masses, no thyromegaly Respiratory: clear to auscultation bilaterally,+ wheezing bilaterally, no crackles. Normal respiratory effort. No accessory muscle use.  Cardiovascular: Regular rate and rhythm, no murmurs / rubs / gallops. No extremity edema. 2+ pedal pulses. No carotid bruits.  Abdomen:  Obese no tenderness, no masses palpated. No hepatosplenomegaly. Bowel sounds positive.  Musculoskeletal: no clubbing / cyanosis. No joint deformity upper and lower extremities. Good ROM, no contractures. Normal muscle tone.  Skin: no rashes, lesions, ulcers.  Neurologic: CN 2-12 grossly intact. Sensation intact, DTR normal. Strength 5/5 in all 4.  Psychiatric: Normal judgment and insight. Alert and oriented x 3. Normal mood.     Labs on Admission: I have personally reviewed following labs and imaging studies  CBC:  Recent Labs Lab 07/18/16 0400  WBC 9.6  NEUTROABS 6.8  HGB 14.9  HCT 47.5*  MCV 104.9*  PLT 270    Basic Metabolic Panel:  Recent Labs Lab 07/18/16 0400  NA 135  K 3.9  CL 89*  CO2 34*  GLUCOSE 154*  BUN 7  CREATININE 0.54  CALCIUM 9.1    GFR: CrCl cannot be calculated (Unknown ideal weight.).  Liver Function Tests: No results for input(s): AST, ALT, ALKPHOS, BILITOT, PROT, ALBUMIN in the last 168 hours. No results for input(s): LIPASE, AMYLASE in the last 168 hours. No results  for input(s): AMMONIA in the last 168 hours.  Coagulation Profile: No results for input(s): INR, PROTIME in the last 168 hours.  Cardiac Enzymes: No results for input(s): CKTOTAL, CKMB, CKMBINDEX, TROPONINI in the  last 168 hours.  BNP (last 3 results)  Recent Labs  04/20/16 1703  PROBNP 30.0    HbA1C: No results for input(s): HGBA1C in the last 72 hours.  CBG: No results for input(s): GLUCAP in the last 168 hours.  Lipid Profile: No results for input(s): CHOL, HDL, LDLCALC, TRIG, CHOLHDL, LDLDIRECT in the last 72 hours.  Thyroid Function Tests: No results for input(s): TSH, T4TOTAL, FREET4, T3FREE, THYROIDAB in the last 72 hours.  Anemia Panel: No results for input(s): VITAMINB12, FOLATE, FERRITIN, TIBC, IRON, RETICCTPCT in the last 72 hours.  Urine analysis:    Component Value Date/Time   COLORURINE YELLOW 07/25/2015 1400   APPEARANCEUR HAZY (A) 07/25/2015 1400   LABSPEC 1.020 07/25/2015 1400   PHURINE 7.0 07/25/2015 1400   GLUCOSEU NEGATIVE 07/25/2015 1400   HGBUR LARGE (A) 07/25/2015 1400   BILIRUBINUR NEGATIVE 07/25/2015 1400   KETONESUR NEGATIVE 07/25/2015 1400   PROTEINUR NEGATIVE 07/25/2015 1400   UROBILINOGEN 1.0 07/25/2015 1400   NITRITE NEGATIVE 07/25/2015 1400   LEUKOCYTESUR NEGATIVE 07/25/2015 1400    Sepsis Labs: @LABRCNTIP (procalcitonin:4,lacticidven:4) )No results found for this or any previous visit (from the past 240 hour(s)).   Radiological Exams on Admission: Dg Chest 2 View  Result Date: 07/18/2016 CLINICAL DATA:  Acute onset of shortness of breath. Initial encounter. EXAM: CHEST  2 VIEW COMPARISON:  Chest radiograph performed 04/05/2016 FINDINGS: The lungs are well-aerated. Mild vascular congestion is noted. Mild bibasilar opacities may reflect mild interstitial edema. No significant pleural effusion or pneumothorax is seen. The heart is borderline normal in size. No acute osseous abnormalities are seen. IMPRESSION: Mild vascular congestion  noted. Mild bibasilar opacities may reflect mild interstitial edema. Electronically Signed   By: Garald Balding M.D.   On: 07/18/2016 04:33    EKG: Independently reviewed.  Assessment/Plan Active Problems:   Obesity   OSA on BiPAP   Chronic respiratory failure with hypercapnia (HCC)   Chronic right-sided CHF (congestive heart failure) (HCC)   Acute on chronic respiratory failure with hypoxia (HCC)   Other emphysema (HCC)   Esophageal reflux   COPD exacerbation (HCC)   Diabetes mellitus type 2 in obese ([place])    Review of Systems: As per HPI otherwise 10 point review of systems negative.   Acute hypoxic respiratory failure likely secondary to acute COPD exacerbation received DuoNeb and magnesium here with better control of her symptoms.Chest x-ray was negative for occult infection.White count 9.6,  Admit to tele obs -  Prednisone 60 mg po bid  -  Duoneb q4h with albuterol q2h prn Continue Darilesp    Mucinex  PPI therapy -  Wean oxygen as tolerated Incentive spirometry CBC in am  Continue BiPAp at night for OSA   Chronic diastolic heart failure, without exacerbation. BNP 100.3  last echocardiogram with normal LVF, EF 55-60  Gr 1 DD.  Appears compensated.     - Daily weights, strict I/O Continue home meds   Type II Diabetes with neuropathy Current blood sugar level is 154 Lab Results  Component Value Date   HGBA1C 5.9 05/31/2016   SSI Heart healthy carb modified diet. Continue lyrica    DVT prophylaxis: Lovenox  Code Status:   Full      Family Communication:  Discussed with patient  Disposition Plan: Expect patient to be discharged to home after condition improves Consults called:    None Admission status:Med Obs    Masaichi Kracht E, PA-C [facility]   If 7PM-7AM, please contact night-coverage [email or web address] Password [email or web address]  07/18/2016, 7:49 AM

## 2016-07-18 NOTE — ED Notes (Signed)
Message sent to pharmacy requesting lovenox

## 2016-07-18 NOTE — Progress Notes (Signed)
Patient setup on BiPAP QHS auto mode. Tolerating well at this time. 4L O2 bleed in. Patient will call if any further assistance needed

## 2016-07-18 NOTE — ED Triage Notes (Signed)
Pt to ED from home by EMS c/o SOB. Hx of COPD, CHF, and asthma; is usually home 3L [place] of home oxygen. Initial o2 sat 83% with CO2 45; EMS gave a duoneb, additional  Albuterol neb treatment, and  Solumedrol. Baseline oxygen saturation 91% on 3L

## 2016-07-18 NOTE — Progress Notes (Signed)
Patient trasfered from ED to [phone] via stretcher; alert and oriented x 4; no complaints of pain; IV saline locked in LAC; skin intact. Orient patient to room and unit;  gave patient care guide; instructed how to use the call bell and  fall risk precautions. Will continue to monitor the patient.

## 2016-07-18 NOTE — ED Notes (Signed)
Admitting at bedside 

## 2016-07-18 NOTE — ED Notes (Signed)
Message sent to pharmacy requesting Daliresp.

## 2016-07-19 DIAGNOSIS — G4733 Obstructive sleep apnea (adult) (pediatric): Secondary | ICD-10-CM

## 2016-07-19 DIAGNOSIS — E669 Obesity, unspecified: Secondary | ICD-10-CM | POA: Diagnosis not present

## 2016-07-19 DIAGNOSIS — J441 Chronic obstructive pulmonary disease with (acute) exacerbation: Secondary | ICD-10-CM | POA: Diagnosis not present

## 2016-07-19 DIAGNOSIS — J9621 Acute and chronic respiratory failure with hypoxia: Secondary | ICD-10-CM | POA: Diagnosis not present

## 2016-07-19 DIAGNOSIS — J9612 Chronic respiratory failure with hypercapnia: Secondary | ICD-10-CM | POA: Diagnosis not present

## 2016-07-19 LAB — GLUCOSE, CAPILLARY
GLUCOSE-CAPILLARY: 191 mg/dL — AB (ref 65–99)
GLUCOSE-CAPILLARY: 204 mg/dL — AB (ref 65–99)
Glucose-Capillary: 231 mg/dL — ABNORMAL HIGH (ref 65–99)
Glucose-Capillary: 220 mg/dL — ABNORMAL HIGH (ref 65–99)

## 2016-07-19 LAB — CBC
HCT: 48.3 % — ABNORMAL HIGH (ref 36.0–46.0)
Hemoglobin: 14.9 g/dL (ref 12.0–15.0)
MCH: 32.9 pg (ref 26.0–34.0)
MCHC: 30.8 g/dL (ref 30.0–36.0)
MCV: 106.6 fL — ABNORMAL HIGH (ref 78.0–100.0)
PLATELETS: 177 10*3/uL (ref 150–400)
RBC: 4.53 MIL/uL (ref 3.87–5.11)
RDW: 13.8 % (ref 11.5–15.5)
WBC: 14.3 10*3/uL — AB (ref 4.0–10.5)

## 2016-07-19 LAB — COMPREHENSIVE METABOLIC PANEL
ALBUMIN: 3.7 g/dL (ref 3.5–5.0)
ALK PHOS: 124 U/L (ref 38–126)
ALT: 62 U/L — ABNORMAL HIGH (ref 14–54)
AST: 49 U/L — AB (ref 15–41)
Anion gap: 10 (ref 5–15)
BILIRUBIN TOTAL: 0.6 mg/dL (ref 0.3–1.2)
BUN: 10 mg/dL (ref 6–20)
CALCIUM: 9.3 mg/dL (ref 8.9–10.3)
CO2: 37 mmol/L — ABNORMAL HIGH (ref 22–32)
Chloride: 88 mmol/L — ABNORMAL LOW (ref 101–111)
Creatinine, Ser: 0.59 mg/dL (ref 0.44–1.00)
GFR calc Af Amer: >60 mL/min (ref >60)
GLUCOSE: 209 mg/dL — AB (ref 65–99)
Potassium: 4.1 mmol/L (ref 3.5–5.1)
Sodium: 135 mmol/L (ref 135–145)
TOTAL PROTEIN: 7.1 g/dL (ref 6.5–8.1)

## 2016-07-19 LAB — STREP PNEUMONIAE URINARY ANTIGEN: Strep Pneumo Urinary Antigen: NEGATIVE

## 2016-07-19 LAB — MRSA PCR SCREENING: MRSA by PCR: NEGATIVE

## 2016-07-19 MED ORDER — LIVING WELL WITH DIABETES BOOK
Freq: Once | Status: DC
  Filled 2016-07-19: qty 1

## 2016-07-19 MED ORDER — IPRATROPIUM-ALBUTEROL 0.5-2.5 (3) MG/3ML IN SOLN
3.0000 mL | Freq: Three times a day (TID) | RESPIRATORY_TRACT | Status: DC
  Administered 2016-07-19 – 2016-07-22 (×9): 3 mL via RESPIRATORY_TRACT
  Filled 2016-07-19 (×11): qty 3

## 2016-07-19 MED ORDER — DOXYCYCLINE HYCLATE 100 MG PO TABS
100.0000 mg | ORAL_TABLET | Freq: Two times a day (BID) | ORAL | Status: DC
  Administered 2016-07-19 – 2016-07-22 (×7): 100 mg via ORAL
  Filled 2016-07-19 (×7): qty 1

## 2016-07-19 NOTE — Progress Notes (Signed)
Inpatient Diabetes Program Recommendations  AACE/ADA: New Consensus Statement on Inpatient Glycemic Control (2015)  Target Ranges:  Prepandial:   less than 140 mg/dL      Peak postprandial:   less than 180 mg/dL (1-2 hours)      Critically ill patients:  140 - 180 mg/dL   Results for Mary [name], Mary [name] (MRN [id number]) as of 07/19/2016 14:09  Ref. Range 07/18/2016 11:34 07/18/2016 16:23 07/18/2016 21:09 07/19/2016 07:56 07/19/2016 12:01  Glucose-Capillary Latest Ref Range: 65 - 99 mg/dL 409221 (H) 811227 (H) 914253 (H) 231 (H) 220 (H)    Review of Glycemic Control  Diabetes history: DM2 Outpatient Diabetes medications: Metformin 500 mg bid+ Basiglar 25 units daily (patient has not taken for approx. 3 weeks)+ Novolog 8 units tid meal coverage (patient has not taken x 2 weeks) Current orders for Inpatient glycemic control: Novolog correction 0-9 units tid  Inpatient Diabetes Program Recommendations:  Spoke with patient and she admits to discontinuing insulin without talking with her physician first. Requested patient to followup with PCP and discuss CBGs for insulin management.  Please consider A1c to determine current prehospital glycemic management and start basal insulin of Lantus 10 units daily.  Thank you, Billy FischerJudy E. Declyn Offield, RN, MSN, CDE Inpatient Glycemic Control Team Team Pager [phone] (8am-5pm) 07/19/2016 2:13 PM

## 2016-07-19 NOTE — Progress Notes (Signed)
PROGRESS NOTE  Mary HakimMichelle L [name] ZOX:[id number] DOB: 09/07/1976 DOA: 07/18/2016 PCP: Jeanine Luzalone, Gregory, FNP  HPI/Recap of past 24 hours:  Feeling better, report productive cough, wheezing is better  Assessment/Plan: Active Problems:   Obesity   OSA on BiPAP   Chronic respiratory failure with hypercapnia (HCC)   Chronic right-sided CHF (congestive heart failure) (HCC)   Acute on chronic respiratory failure with hypoxia (HCC)   Other emphysema (HCC)   Esophageal reflux   COPD exacerbation (HCC)   Diabetes mellitus type 2 in obese (HCC)  Acute on chronic hypoxic respiratory failure likely secondary to acute COPD exacerbation and diastolic chf exacerbation received DuoNeb and magnesium here with better control of her symptoms.Chest x-ray was negative for occult infection.White count 9.6,  -  Prednisone 60 mg po bid  -  Duoneb q4h with albuterol q2h prn Continue Darilesp    Mucinex  PPI therapy -  Wean oxygen as tolerated Incentive spirometry Continue BiPAp at night for OSA  Continue diuresis Patient report persistent wheezing, productive cough, will collect sputum sample for culture, urine strep and legionella antigen testing, mrsa screening, start empirical abx with doxycycline  Chronic diastolic heart failure, with mild  Exacerbation as evidenced by cxr with mild Mild vascular congestion noted. Mild bibasilar opacities may reflect mild interstitial edema.  BNP 100.3  last echocardiogram with normal LVF, EF 55-60  Gr 1 DD.  Appears compensated.     - Daily weights, strict I/O Continue demadex Patient does not have a scale, case manager consulted   Type II Diabetes with neuropathy Current blood sugar level is 154 Recent Labs       Lab Results  Component Value Date   HGBA1C 5.9 05/31/2016     SSI Heart healthy carb modified diet. Continue lyrica   Body mass index is 36.8 kg/m.  DVT prophylaxis: Lovenox  Code Status:   Full      Family Communication:  Discussed  with patient  Disposition Plan: Expect patient to be discharged to home after condition improves Consults called:    None     Procedures:  none  Antibiotics:  Oral doxycycline   Objective: BP 102/69 (BP Location: Right Arm)   Pulse 91   Temp 97.4 F (36.3 C)   Resp 18   Ht 5\' 2"  (1.575 m)   Wt 91.3 kg (201 lb 3.2 oz)   SpO2 91%   BMI 36.80 kg/m   Intake/Output Summary (Last 24 hours) at 07/19/16 0813 Last data filed at 07/19/16 0754  Gross per 24 hour  Intake              900 ml  Output              300 ml  Net              600 ml   Filed Weights   07/18/16 1136 07/19/16 0449  Weight: 90.9 kg (200 lb 4.8 oz) 91.3 kg (201 lb 3.2 oz)    Exam:   General:  NAD, obese  Cardiovascular: RRR  Respiratory: bilateral mild wheezing  Abdomen: Soft/ND/NT, positive BS  Musculoskeletal: No Edema  Neuro: aaox3  Data Reviewed: Basic Metabolic Panel:  Recent Labs Lab 07/18/16 0400  NA 135  K 3.9  CL 89*  CO2 34*  GLUCOSE 154*  BUN 7  CREATININE 0.54  CALCIUM 9.1   Liver Function Tests: No results for input(s): AST, ALT, ALKPHOS, BILITOT, PROT, ALBUMIN in the last 168 hours. No results  for input(s): LIPASE, AMYLASE in the last 168 hours. No results for input(s): AMMONIA in the last 168 hours. CBC:  Recent Labs Lab 07/18/16 0400 07/19/16 0722  WBC 9.6 14.3*  NEUTROABS 6.8  --   HGB 14.9 14.9  HCT 47.5* 48.3*  MCV 104.9* 106.6*  PLT 162 177   Cardiac Enzymes:   No results for input(s): CKTOTAL, CKMB, CKMBINDEX, TROPONINI in the last 168 hours. BNP (last 3 results)  Recent Labs  12/19/15 0440 04/01/16 1750 07/18/16 0400  BNP 192.9* 19.4 100.3*    ProBNP (last 3 results)  Recent Labs  04/20/16 1703  PROBNP 30.0    CBG:  Recent Labs Lab 07/18/16 1134 07/18/16 1623 07/18/16 2109 07/19/16 0756  GLUCAP 221* 227* 253* 231*    No results found for this or any previous visit (from the past 240 hour(s)).   Studies: No results  found.  Scheduled Meds: . enoxaparin (LOVENOX) injection  40 mg Subcutaneous Q24H  . guaiFENesin  600 mg Oral BID  . insulin aspart  0-9 Units Subcutaneous TID WC  . ipratropium-albuterol  3 mL Nebulization Q4H  . pantoprazole  40 mg Oral BID AC  . predniSONE  60 mg Oral Q breakfast  . pregabalin  50 mg Oral TID  . roflumilast  500 mcg Oral Daily  . tiotropium  18 mcg Inhalation Daily  . torsemide  20 mg Oral Daily    Continuous Infusions:    Time spent: 25mins  Shagun Wordell MD, PhD  [facility] Pager [phone]. If 7PM-7AM, please contact night-coverage at [email or web address], password [email or web address] 07/19/2016, 8:13 AM  LOS: 0 days

## 2016-07-20 DIAGNOSIS — R740 Nonspecific elevation of levels of transaminase and lactic acid dehydrogenase [LDH]: Secondary | ICD-10-CM | POA: Diagnosis present

## 2016-07-20 DIAGNOSIS — Z6836 Body mass index (BMI) 36.0-36.9, adult: Secondary | ICD-10-CM | POA: Diagnosis not present

## 2016-07-20 DIAGNOSIS — I5033 Acute on chronic diastolic (congestive) heart failure: Secondary | ICD-10-CM | POA: Diagnosis present

## 2016-07-20 DIAGNOSIS — E669 Obesity, unspecified: Secondary | ICD-10-CM | POA: Diagnosis present

## 2016-07-20 DIAGNOSIS — E1142 Type 2 diabetes mellitus with diabetic polyneuropathy: Secondary | ICD-10-CM | POA: Diagnosis present

## 2016-07-20 DIAGNOSIS — J9621 Acute and chronic respiratory failure with hypoxia: Secondary | ICD-10-CM | POA: Diagnosis present

## 2016-07-20 DIAGNOSIS — G4733 Obstructive sleep apnea (adult) (pediatric): Secondary | ICD-10-CM | POA: Diagnosis present

## 2016-07-20 DIAGNOSIS — Z87891 Personal history of nicotine dependence: Secondary | ICD-10-CM | POA: Diagnosis not present

## 2016-07-20 DIAGNOSIS — Z9981 Dependence on supplemental oxygen: Secondary | ICD-10-CM | POA: Diagnosis not present

## 2016-07-20 DIAGNOSIS — R0602 Shortness of breath: Secondary | ICD-10-CM | POA: Diagnosis present

## 2016-07-20 DIAGNOSIS — J9612 Chronic respiratory failure with hypercapnia: Secondary | ICD-10-CM | POA: Diagnosis not present

## 2016-07-20 DIAGNOSIS — Z794 Long term (current) use of insulin: Secondary | ICD-10-CM | POA: Diagnosis not present

## 2016-07-20 DIAGNOSIS — J441 Chronic obstructive pulmonary disease with (acute) exacerbation: Secondary | ICD-10-CM | POA: Diagnosis present

## 2016-07-20 DIAGNOSIS — Z86711 Personal history of pulmonary embolism: Secondary | ICD-10-CM | POA: Diagnosis not present

## 2016-07-20 DIAGNOSIS — Z86718 Personal history of other venous thrombosis and embolism: Secondary | ICD-10-CM | POA: Diagnosis not present

## 2016-07-20 DIAGNOSIS — Z79899 Other long term (current) drug therapy: Secondary | ICD-10-CM | POA: Diagnosis not present

## 2016-07-20 LAB — CBC
HCT: 48.1 % — ABNORMAL HIGH (ref 36.0–46.0)
HEMOGLOBIN: 14.4 g/dL (ref 12.0–15.0)
MCH: 32.1 pg (ref 26.0–34.0)
MCHC: 29.9 g/dL — ABNORMAL LOW (ref 30.0–36.0)
MCV: 107.4 fL — AB (ref 78.0–100.0)
Platelets: 176 10*3/uL (ref 150–400)
RBC: 4.48 MIL/uL (ref 3.87–5.11)
RDW: 14 % (ref 11.5–15.5)
WBC: 10 10*3/uL (ref 4.0–10.5)

## 2016-07-20 LAB — COMPREHENSIVE METABOLIC PANEL
ALK PHOS: 116 U/L (ref 38–126)
ALT: 83 U/L — AB (ref 14–54)
AST: 196 U/L — ABNORMAL HIGH (ref 15–41)
Albumin: 3.2 g/dL — ABNORMAL LOW (ref 3.5–5.0)
Anion gap: 9 (ref 5–15)
BUN: 18 mg/dL (ref 6–20)
CALCIUM: 8.9 mg/dL (ref 8.9–10.3)
CO2: 41 mmol/L — AB (ref 22–32)
CREATININE: 0.67 mg/dL (ref 0.44–1.00)
Chloride: 89 mmol/L — ABNORMAL LOW (ref 101–111)
Glucose, Bld: 124 mg/dL — ABNORMAL HIGH (ref 65–99)
Potassium: 3.5 mmol/L (ref 3.5–5.1)
SODIUM: 139 mmol/L (ref 135–145)
Total Bilirubin: 0.9 mg/dL (ref 0.3–1.2)
Total Protein: 6.4 g/dL — ABNORMAL LOW (ref 6.5–8.1)

## 2016-07-20 LAB — EXPECTORATED SPUTUM ASSESSMENT W REFEX TO RESP CULTURE

## 2016-07-20 LAB — EXPECTORATED SPUTUM ASSESSMENT W GRAM STAIN, RFLX TO RESP C

## 2016-07-20 LAB — LEGIONELLA PNEUMOPHILA SEROGP 1 UR AG: L. pneumophila Serogp 1 Ur Ag: NEGATIVE

## 2016-07-20 LAB — MAGNESIUM: Magnesium: 1.9 mg/dL (ref 1.7–2.4)

## 2016-07-20 LAB — GLUCOSE, CAPILLARY
GLUCOSE-CAPILLARY: 103 mg/dL — AB (ref 65–99)
GLUCOSE-CAPILLARY: 193 mg/dL — AB (ref 65–99)
Glucose-Capillary: 235 mg/dL — ABNORMAL HIGH (ref 65–99)
Glucose-Capillary: 277 mg/dL — ABNORMAL HIGH (ref 65–99)

## 2016-07-20 MED ORDER — FUROSEMIDE 10 MG/ML IJ SOLN
40.0000 mg | Freq: Once | INTRAMUSCULAR | Status: AC
  Administered 2016-07-20: 40 mg via INTRAVENOUS
  Filled 2016-07-20: qty 4

## 2016-07-20 MED ORDER — POTASSIUM CHLORIDE CRYS ER 20 MEQ PO TBCR
40.0000 meq | EXTENDED_RELEASE_TABLET | Freq: Once | ORAL | Status: AC
  Administered 2016-07-20: 40 meq via ORAL
  Filled 2016-07-20: qty 2

## 2016-07-20 NOTE — Progress Notes (Signed)
PROGRESS NOTE  Mary HakimMichelle L [name] ZOX:[id number] DOB: 07/27/1976 DOA: 07/18/2016 PCP: Jeanine Luzalone, Gregory, FNP  HPI/Recap of past 24 hours:   report continued productive cough, wheezing is better, but sob not back to baseline, still require higher level of oxygen supplement, lft increasing , weight is up  Assessment/Plan: Active Problems:   Obesity   OSA on BiPAP   Chronic respiratory failure with hypercapnia (HCC)   Chronic right-sided CHF (congestive heart failure) (HCC)   Acute on chronic respiratory failure with hypoxia (HCC)   Other emphysema (HCC)   Esophageal reflux   COPD exacerbation (HCC)   Diabetes mellitus type 2 in obese (HCC)  Acute on chronic hypoxic respiratory failure likely secondary to acute COPD exacerbation and diastolic chf exacerbation received DuoNeb and magnesium here with better control of her symptoms.Chest x-ray was negative for occult infection.White count 9.6,  -  Prednisone 60 mg po bid ,   Duoneb q4h with albuterol q2h prn, Continue Darilesp / Mucinex/Incentive spirometry Continue BiPAp at night for OSA  -Continue diuresis, add iv lasix due to concerning weight gain and worsening lft  sputum culture in process, urine strep and legionella antigen negative, mrsa screening negative, on  empirical abx with doxycycline, choose oral abx to minimize volume  -  Wean oxygen as tolerated to baseline  Chronic diastolic heart failure, with mild  Exacerbation as evidenced by cxr with mild Mild vascular congestion noted. Mild bibasilar opacities may reflect mild interstitial edema.  BNP 100.3  last echocardiogram with normal LVF, EF 55-60  Gr 1 DD.  Appears compensated.     - Daily weights, strict I/O, she has history of accumulating fluids in a short time periods, need to close monitor volume status -Patient does not have a scale, case manager consulted -lft worsening on 8/15 indication liver congestion, will give additional dose of  iv lasix to facilitate diuresis  ,Continue demadex  Type II Diabetes with neuropathy Current blood sugar level is 154 Recent Labs       Lab Results  Component Value Date   HGBA1C 5.9 05/31/2016     SSI Heart healthy carb modified diet. Continue lyrica    Worsening of  lft: likely  From liver congestion, but will add on hepatitis panel for completeness, no piror testing in the system.  Body mass index is 36.8 kg/m.  DVT prophylaxis: Lovenox  Code Status:   Full      Family Communication:  Discussed with patient  Disposition Plan: home in 24-48 hrs if able to wean oxygen to baseline Consults called:    None     Procedures:  none  Antibiotics:  Oral doxycycline   Objective: BP 110/80 (BP Location: Right Arm)   Pulse 67   Temp 97.6 F (36.4 C)   Resp 20   Ht 5\' 2"  (1.575 m)   Wt 91.2 kg (201 lb)   SpO2 93%   BMI 36.76 kg/m   Intake/Output Summary (Last 24 hours) at 07/20/16 1056 Last data filed at 07/20/16 1015  Gross per 24 hour  Intake              480 ml  Output             1400 ml  Net             -920 ml   Filed Weights   07/18/16 1136 07/19/16 0449 07/20/16 0600  Weight: 90.9 kg (200 lb 4.8 oz) 91.3 kg (201 lb 3.2 oz) 91.2 kg (  201 lb)    Exam:   General:  NAD, obese  Cardiovascular: RRR  Respiratory: bilateral mild wheezing  Abdomen: Soft/ND/NT, positive BS  Musculoskeletal: No Edema  Neuro: aaox3  Data Reviewed: Basic Metabolic Panel:  Recent Labs Lab 07/18/16 0400 07/19/16 0722 07/20/16 0526  NA 135 135 139  K 3.9 4.1 3.5  CL 89* 88* 89*  CO2 34* 37* 41*  GLUCOSE 154* 209* 124*  BUN 7 10 18   CREATININE 0.54 0.59 0.67  CALCIUM 9.1 9.3 8.9  MG  --   --  1.9   Liver Function Tests:  Recent Labs Lab 07/19/16 0722 07/20/16 0526  AST 49* 196*  ALT 62* 83*  ALKPHOS 124 116  BILITOT 0.6 0.9  PROT 7.1 6.4*  ALBUMIN 3.7 3.2*   No results for input(s): LIPASE, AMYLASE in the last 168 hours. No results for input(s): AMMONIA in the last 168  hours. CBC:  Recent Labs Lab 07/18/16 0400 07/19/16 0722 07/20/16 0526  WBC 9.6 14.3* 10.0  NEUTROABS 6.8  --   --   HGB 14.9 14.9 14.4  HCT 47.5* 48.3* 48.1*  MCV 104.9* 106.6* 107.4*  PLT 162 177 176   Cardiac Enzymes:   No results for input(s): CKTOTAL, CKMB, CKMBINDEX, TROPONINI in the last 168 hours. BNP (last 3 results)  Recent Labs  12/19/15 0440 04/01/16 1750 07/18/16 0400  BNP 192.9* 19.4 100.3*    ProBNP (last 3 results)  Recent Labs  04/20/16 1703  PROBNP 30.0    CBG:  Recent Labs Lab 07/19/16 0756 07/19/16 1201 07/19/16 1705 07/19/16 2121 07/20/16 0741  GLUCAP 231* 220* 191* 204* 103*    Recent Results (from the past 240 hour(s))  MRSA PCR Screening     Status: None   Collection Time: 07/19/16  1:29 PM  Result Value Ref Range Status   MRSA by PCR NEGATIVE NEGATIVE Final    Comment:        The GeneXpert MRSA Assay (FDA approved for NASAL specimens only), is one component of a comprehensive MRSA colonization surveillance program. It is not intended to diagnose MRSA infection nor to guide or monitor treatment for MRSA infections.   Culture, expectorated sputum-assessment     Status: None   Collection Time: 07/20/16  9:30 AM  Result Value Ref Range Status   Specimen Description EXPECTORATED SPUTUM  Final   Special Requests NONE  Final   Sputum evaluation   Final    THIS SPECIMEN IS ACCEPTABLE. RESPIRATORY CULTURE REPORT TO FOLLOW.   Report Status 07/20/2016 FINAL  Final     Studies: No results found.  Scheduled Meds: . doxycycline  100 mg Oral Q12H  . enoxaparin (LOVENOX) injection  40 mg Subcutaneous Q24H  . guaiFENesin  600 mg Oral BID  . insulin aspart  0-9 Units Subcutaneous TID WC  . ipratropium-albuterol  3 mL Nebulization TID  . living well with diabetes book   Does not apply Once  . pantoprazole  40 mg Oral BID AC  . potassium chloride  40 mEq Oral Once  . predniSONE  60 mg Oral Q breakfast  . pregabalin  50 mg Oral  TID  . roflumilast  500 mcg Oral Daily  . tiotropium  18 mcg Inhalation Daily  . torsemide  20 mg Oral Daily    Continuous Infusions:    Time spent: 25mins  Jamela Cumbo MD, PhD  [facility] Pager [phone]. If 7PM-7AM, please contact night-coverage at [email or web address], password [email or web address] 07/20/2016, 10:56 AM  LOS:  0 days

## 2016-07-20 NOTE — Care Management Note (Addendum)
Case Management Note  Patient Details  Name: Mary HakimMichelle L Ragin MRN: [id number] Date of Birth: 05/23/1976  Subjective/Objective:     Presents with  Acute on chronic hypoxic respiratory failurelikely secondary to acute COPD exacerbation and diastolic chf exacerbation. From home with husband, independent with [facility] PTA. Home oxygen 2-3l/min.        PCP: Jeanine LuzGregory Calone  Action/Plan: Return to home when medically stable. CM to f/u with d/c needs.  Expected Discharge Date:   07/21/2016           Expected Discharge Plan:  Home/Self Care (Resides with husband)  In-House Referral:     Discharge planning Services  CM Consult  Post Acute Care Choice:  Durable Medical Equipment, Resumption of Svcs/PTA Provider Choice offered to:     DME Arranged:  Oxygen DME Agency:  [facility] (active with [facility]/ home oxygen 2-3 l/min)  HH Arranged:    HH Agency:     Status of Service:  In process, will continue to follow  If discussed at Long Length of Stay Meetings, dates discussed:    Additional Comments:  Epifanio LeschesCole, Nathian Stencil Hudson, RN 07/20/2016, 12:03 PM

## 2016-07-20 NOTE — Progress Notes (Signed)
Patient stated that she is able to place herself on BiPAP .  RT will continue to monitor.

## 2016-07-21 DIAGNOSIS — J441 Chronic obstructive pulmonary disease with (acute) exacerbation: Secondary | ICD-10-CM

## 2016-07-21 LAB — CBC
HEMATOCRIT: 51.9 % — AB (ref 36.0–46.0)
HEMOGLOBIN: 16.3 g/dL — AB (ref 12.0–15.0)
MCH: 33.3 pg (ref 26.0–34.0)
MCHC: 31.4 g/dL (ref 30.0–36.0)
MCV: 105.9 fL — AB (ref 78.0–100.0)
Platelets: 180 10*3/uL (ref 150–400)
RBC: 4.9 MIL/uL (ref 3.87–5.11)
RDW: 13.8 % (ref 11.5–15.5)
WBC: 9.5 10*3/uL (ref 4.0–10.5)

## 2016-07-21 LAB — COMPREHENSIVE METABOLIC PANEL
ALBUMIN: 4 g/dL (ref 3.5–5.0)
ALK PHOS: 112 U/L (ref 38–126)
ALT: 207 U/L — ABNORMAL HIGH (ref 14–54)
ANION GAP: 8 (ref 5–15)
AST: 338 U/L — ABNORMAL HIGH (ref 15–41)
BILIRUBIN TOTAL: 0.9 mg/dL (ref 0.3–1.2)
BUN: 16 mg/dL (ref 6–20)
CALCIUM: 9.7 mg/dL (ref 8.9–10.3)
CO2: 40 mmol/L — ABNORMAL HIGH (ref 22–32)
Chloride: 89 mmol/L — ABNORMAL LOW (ref 101–111)
Creatinine, Ser: 0.63 mg/dL (ref 0.44–1.00)
GFR calc Af Amer: >60 mL/min (ref >60)
GLUCOSE: 151 mg/dL — AB (ref 65–99)
Potassium: 3.6 mmol/L (ref 3.5–5.1)
Sodium: 137 mmol/L (ref 135–145)
TOTAL PROTEIN: 7.3 g/dL (ref 6.5–8.1)

## 2016-07-21 LAB — GLUCOSE, CAPILLARY
GLUCOSE-CAPILLARY: 125 mg/dL — AB (ref 65–99)
GLUCOSE-CAPILLARY: 225 mg/dL — AB (ref 65–99)
GLUCOSE-CAPILLARY: 239 mg/dL — AB (ref 65–99)
GLUCOSE-CAPILLARY: 281 mg/dL — AB (ref 65–99)

## 2016-07-21 NOTE — Care Management Note (Addendum)
Case Management Note  Patient Details  Name: Mary HakimMichelle L [name] MRN: [id number] Date of Birth: 08/03/1976  Subjective/Objective:                    Action/Plan: Plan is to d/c to home today. Portable oxygen tank  bedside for transportation to to home.  Expected Discharge Date:   07/21/2016           Expected Discharge Plan:  Home/Self Care (Resides with husband)  In-House Referral:     Discharge planning Services  CM Consult  Post Acute Care Choice:  Durable Medical Equipment, Resumption of Svcs/PTA Provider Choice offered to:     DME Arranged:  Oxygen DME Agency:  [facility] (active with [facility]/ home oxygen 2-3 l/min)  HH Arranged:    HH Agency:     Status of Service:  Completed, signed off  If discussed at Long Length of Stay Meetings, dates discussed:    Additional Comments:  Epifanio LeschesCole, Cheron Coryell Hudson, RN 07/21/2016, 11:05 AM

## 2016-07-21 NOTE — Progress Notes (Signed)
PROGRESS NOTE  Mary [name] ZOX:[id number] DOB: 11/12/1976 DOA: 07/18/2016 PCP: Jeanine Luzalone, Gregory, FNP  HPI/Recap of past 24 hours: Showing clinical improvement, now down to her baseline oxygen requirement   Assessment/Plan: Active Problems:   Obesity   OSA on BiPAP   Chronic respiratory failure with hypercapnia (HCC)   Chronic right-sided CHF (congestive heart failure) (HCC)   Acute on chronic respiratory failure with hypoxia (HCC)   Other emphysema (HCC)   Esophageal reflux   COPD exacerbation (HCC)   Diabetes mellitus type 2 in obese (HCC)  Acute on chronic hypoxic respiratory failure likely secondary to acute COPD exacerbation and diastolic chf exacerbation received DuoNeb and magnesium here with better control of her symptoms.Chest x-ray was negative for occult infection.White count 9.6,  -  Prednisone 60 mg po bid ,   Duoneb q4h with albuterol q2h prn, Continue Darilesp / Mucinex/Incentive spirometry Continue BiPAp at night for OSA  -On 07/21/2016 patient showing clinical improvement, back to her baseline oxygen requirement, ambulating down the hallway. Overall showed clinical improvement with steroids and diuresis  Chronic diastolic heart failure, with mild  Exacerbation as evidenced by cxr with mild Mild vascular congestion noted. Mild bibasilar opacities may reflect mild interstitial edema.  BNP 100.3  last echocardiogram with normal LVF, EF 55-60  Gr 1 DD.  Appears compensated.     - Daily weights, strict I/O, she has history of accumulating fluids in a short time periods, need to close monitor volume status -Patient does not have a scale, case manager consulted -lft worsening on 8/15 indication liver congestion, will give additional dose of  iv lasix to facilitate diuresis ,Continue demadex  Elevated transaminases -Labs on 07/22/2015 showing an upward trend in transaminases AST: 338<196<49 and ALT 207<83<62. -This could be related to hepatic congestion in setting of  acute CHF -Plan to further workup with right upper quadrant ultrasound to assess for other possibilities  Type II Diabetes with neuropathy Current blood sugar level is 154 Recent Labs       Lab Results  Component Value Date   HGBA1C 5.9 05/31/2016     SSI Heart healthy carb modified diet. Continue lyrica    Worsening of  lft: likely  From liver congestion, but will add on hepatitis panel for completeness, no piror testing in the system.  Body mass index is 36.8 kg/m.  DVT prophylaxis: Lovenox  Code Status:   Full      Family Communication:  Discussed with patient  Disposition Plan:  anticipate discharge home in the next 24 hours Consults called:    None     Procedures:  none  Antibiotics:  Oral doxycycline   Objective: BP 118/69   Pulse 87   Temp 97.8 F (36.6 C) (Oral)   Resp 18   Ht 5\' 2"  (1.575 m)   Wt 90.8 kg (200 lb 3.2 oz)   SpO2 90%   BMI 36.62 kg/m   Intake/Output Summary (Last 24 hours) at 07/21/16 1735 Last data filed at 07/21/16 1400  Gross per 24 hour  Intake              360 ml  Output                0 ml  Net              360 ml   Filed Weights   07/19/16 0449 07/20/16 0600 07/21/16 0519  Weight: 91.3 kg (201 lb 3.2 oz) 91.2 kg (201 lb) 90.8  kg (200 lb 3.2 oz)    Exam:   General:  NAD, obese  Cardiovascular: RRR  Respiratory: Diminished breath sounds bilaterally with only a few scattered expiratory wheezes  Abdomen: Soft/ND/NT, positive BS  Musculoskeletal: No Edema  Neuro: aaox3  Data Reviewed: Basic Metabolic Panel:  Recent Labs Lab 07/18/16 0400 07/19/16 0722 07/20/16 0526 07/21/16 0859  NA 135 135 139 137  K 3.9 4.1 3.5 3.6  CL 89* 88* 89* 89*  CO2 34* 37* 41* 40*  GLUCOSE 154* 209* 124* 151*  BUN 7 10 18 16   CREATININE 0.54 0.59 0.67 0.63  CALCIUM 9.1 9.3 8.9 9.7  MG  --   --  1.9  --    Liver Function Tests:  Recent Labs Lab 07/19/16 0722 07/20/16 0526 07/21/16 0859  AST 49* 196* 338*  ALT  62* 83* 207*  ALKPHOS 124 116 112  BILITOT 0.6 0.9 0.9  PROT 7.1 6.4* 7.3  ALBUMIN 3.7 3.2* 4.0   No results for input(s): LIPASE, AMYLASE in the last 168 hours. No results for input(s): AMMONIA in the last 168 hours. CBC:  Recent Labs Lab 07/18/16 0400 07/19/16 0722 07/20/16 0526 07/21/16 0859  WBC 9.6 14.3* 10.0 9.5  NEUTROABS 6.8  --   --   --   HGB 14.9 14.9 14.4 16.3*  HCT 47.5* 48.3* 48.1* 51.9*  MCV 104.9* 106.6* 107.4* 105.9*  PLT 162 177 176 180   Cardiac Enzymes:   No results for input(s): CKTOTAL, CKMB, CKMBINDEX, TROPONINI in the last 168 hours. BNP (last 3 results)  Recent Labs  12/19/15 0440 04/01/16 1750 07/18/16 0400  BNP 192.9* 19.4 100.3*    ProBNP (last 3 results)  Recent Labs  04/20/16 1703  PROBNP 30.0    CBG:  Recent Labs Lab 07/20/16 1155 07/20/16 1733 07/20/16 2118 07/21/16 0739 07/21/16 1203  GLUCAP 193* 235* 277* 125* 225*    Recent Results (from the past 240 hour(s))  MRSA PCR Screening     Status: None   Collection Time: 07/19/16  1:29 PM  Result Value Ref Range Status   MRSA by PCR NEGATIVE NEGATIVE Final    Comment:        The GeneXpert MRSA Assay (FDA approved for NASAL specimens only), is one component of a comprehensive MRSA colonization surveillance program. It is not intended to diagnose MRSA infection nor to guide or monitor treatment for MRSA infections.   Culture, expectorated sputum-assessment     Status: None   Collection Time: 07/20/16  9:30 AM  Result Value Ref Range Status   Specimen Description EXPECTORATED SPUTUM  Final   Special Requests NONE  Final   Sputum evaluation   Final    THIS SPECIMEN IS ACCEPTABLE. RESPIRATORY CULTURE REPORT TO FOLLOW.   Report Status 07/20/2016 FINAL  Final  Culture, respiratory (NON-Expectorated)     Status: None (Preliminary result)   Collection Time: 07/20/16  9:30 AM  Result Value Ref Range Status   Specimen Description SPUTUM  Final   Special Requests NONE   Final   Gram Stain   Final    ABUNDANT WBC PRESENT,BOTH PMN AND MONONUCLEAR ABUNDANT GRAM POSITIVE COCCI IN CHAINS ABUNDANT GRAM VARIABLE ROD FEW SQUAMOUS EPITHELIAL CELLS PRESENT NO YEAST OR FUNGAL ELEMENTS SEEN    Culture CULTURE REINCUBATED FOR BETTER GROWTH  Final   Report Status PENDING  Incomplete     Studies: No results found.  Scheduled Meds: . doxycycline  100 mg Oral Q12H  . enoxaparin (LOVENOX) injection  40 mg Subcutaneous Q24H  . guaiFENesin  600 mg Oral BID  . insulin aspart  0-9 Units Subcutaneous TID WC  . ipratropium-albuterol  3 mL Nebulization TID  . living well with diabetes book   Does not apply Once  . pantoprazole  40 mg Oral BID AC  . predniSONE  60 mg Oral Q breakfast  . pregabalin  50 mg Oral TID  . roflumilast  500 mcg Oral Daily  . tiotropium  18 mcg Inhalation Daily  . torsemide  20 mg Oral Daily    Continuous Infusions:    Time spent: 25mins  Jeralyn BennettZAMORA, Jessilynn Taft MD, PhD  [facility] Pager [phone]. If 7PM-7AM, please contact night-coverage at [email or web address], password [email or web address] 07/21/2016, 5:35 PM  LOS: 1 day

## 2016-07-21 NOTE — Progress Notes (Signed)
Patient stated she didn't need any assistance with applying her CPAP.  RT advised for patient to call if she needed any assistance. RT will continue to monitor

## 2016-07-22 ENCOUNTER — Inpatient Hospital Stay (HOSPITAL_COMMUNITY): Payer: 59

## 2016-07-22 DIAGNOSIS — J9612 Chronic respiratory failure with hypercapnia: Secondary | ICD-10-CM

## 2016-07-22 DIAGNOSIS — J9621 Acute and chronic respiratory failure with hypoxia: Secondary | ICD-10-CM

## 2016-07-22 LAB — COMPREHENSIVE METABOLIC PANEL
ALBUMIN: 3.6 g/dL (ref 3.5–5.0)
ALT: 232 U/L — ABNORMAL HIGH (ref 14–54)
ANION GAP: 11 (ref 5–15)
AST: 258 U/L — ABNORMAL HIGH (ref 15–41)
Alkaline Phosphatase: 96 U/L (ref 38–126)
BUN: 12 mg/dL (ref 6–20)
CALCIUM: 9.5 mg/dL (ref 8.9–10.3)
CHLORIDE: 89 mmol/L — AB (ref 101–111)
CO2: 36 mmol/L — AB (ref 22–32)
Creatinine, Ser: 0.59 mg/dL (ref 0.44–1.00)
GFR calc non Af Amer: >60 mL/min (ref >60)
GLUCOSE: 118 mg/dL — AB (ref 65–99)
POTASSIUM: 3.9 mmol/L (ref 3.5–5.1)
SODIUM: 136 mmol/L (ref 135–145)
Total Bilirubin: 0.7 mg/dL (ref 0.3–1.2)
Total Protein: 6.5 g/dL (ref 6.5–8.1)

## 2016-07-22 LAB — CULTURE, RESPIRATORY: CULTURE: NORMAL

## 2016-07-22 LAB — CBC
HEMATOCRIT: 48.4 % — AB (ref 36.0–46.0)
HEMOGLOBIN: 15.1 g/dL — AB (ref 12.0–15.0)
MCH: 32.1 pg (ref 26.0–34.0)
MCHC: 31.2 g/dL (ref 30.0–36.0)
MCV: 102.8 fL — AB (ref 78.0–100.0)
Platelets: 181 10*3/uL (ref 150–400)
RBC: 4.71 MIL/uL (ref 3.87–5.11)
RDW: 13.3 % (ref 11.5–15.5)
WBC: 10.3 10*3/uL (ref 4.0–10.5)

## 2016-07-22 LAB — HEPATITIS PANEL, ACUTE
HEP A IGM: NEGATIVE
HEP B C IGM: NEGATIVE
HEP B S AG: NEGATIVE

## 2016-07-22 LAB — GLUCOSE, CAPILLARY
GLUCOSE-CAPILLARY: 117 mg/dL — AB (ref 65–99)
GLUCOSE-CAPILLARY: 172 mg/dL — AB (ref 65–99)

## 2016-07-22 LAB — HEMOGLOBIN A1C
Hgb A1c MFr Bld: 5.4 % (ref 4.8–5.6)
Mean Plasma Glucose: 108 mg/dL

## 2016-07-22 MED ORDER — PANTOPRAZOLE SODIUM 40 MG PO TBEC: 40.0000 mg | DELAYED_RELEASE_TABLET | Freq: Two times a day (BID) | ORAL | 0 refills | Status: AC

## 2016-07-22 MED ORDER — PREDNISONE 10 MG (21) PO TBPK: ORAL_TABLET | ORAL | 0 refills | Status: DC

## 2016-07-22 MED ORDER — GUAIFENESIN ER 600 MG PO TB12
600.0000 mg | ORAL_TABLET | Freq: Two times a day (BID) | ORAL | 0 refills | Status: DC
Start: 2016-07-22 — End: 2016-08-13

## 2016-07-22 MED ORDER — DOXYCYCLINE HYCLATE 100 MG PO TABS: 100.0000 mg | ORAL_TABLET | Freq: Two times a day (BID) | ORAL | 0 refills | Status: DC

## 2016-07-22 NOTE — Progress Notes (Signed)
Pt is discharging to home today. No further needs per CM. Gae GallopAngela Grazia Taffe RN,BSN,CM

## 2016-07-22 NOTE — Discharge Summary (Addendum)
Physician Discharge Summary  AILIN ROCHFORD [name]:[id number] DOB: [birth year] DOA: 07/18/2016  PCP: Mauricio Po, FNP  Admit date: 07/18/2016 Discharge date: 07/22/2016  Time spent: 35 minutes  Recommendations for Outpatient Follow-up:  1. Please follow up on respiratory status, she was admitted for a COPD exacerbation treated with steroids and antibiotics.    Discharge Diagnoses:  Active Problems:   Obesity   OSA on BiPAP   Chronic respiratory failure with hypercapnia (HCC)   Chronic right-sided CHF (congestive heart failure) (HCC)   Acute on chronic respiratory failure with hypoxia (HCC)   Other emphysema (HCC)   Esophageal reflux   COPD exacerbation ([place])   Diabetes mellitus type 2 in obese [facility])   Discharge Condition: Stable  Diet recommendation: Heart Healthy  Filed Weights   07/20/16 0600 07/21/16 0519 07/22/16 0624  Weight: 91.2 kg (201 lb) 90.8 kg (200 lb 3.2 oz) 90.4 kg (199 lb 6.4 oz)    History of present illness:  Mary [name] is a 40 y.o. female with medical history significant for COPD-asthma on oxygen at 2-3 L continuous,obstructive sleep apnea on BiPAP, history of DVT of the upper extremity/ PE not on anticoagulants, chronic right CHF, history of hepatic alcoholic cirrhosis,presenting to the ED with sudden onset of constant shortness of breath over the last 24 hours associated with fatigue.She reports this my have been triggered by the barbeque's smoke, "irritating the lungs" Her home O2 dropped to 74% for which she called EMS. En  route, she received 125 mg Solu-Medrol, with albuterol Denies fevers, chills, night sweats, vision changes, or mucositis. Denies any chest pain or palpitations. Denies lower extremity swelling. Denies nausea, heartburn or change in bowel habits.. Appetite is normal. Denies any dysuria. Denies abnormal skin rashes, or neuropathy. Denies any bleeding issues such as epistaxis, hematemesis, hematuria or hematochezia. Ambulating without  difficulty.  Hospital Course:   Acute on chronic hypoxic respiratory failurelikely secondary to acute COPD exacerbation and diastolic chf exacerbation received DuoNeb and magnesium here with better control of her symptoms.Chest x-ray was negative for occult infection.White count 9.6,  - During this hospitalization she was treated with Prednisone 60 mg po daily,Duoneb q4h with albuterol q2h prn, Darilesp /Mucinex/Incentive spirometry as well as Doxy.  -She was also treated with torsemide -On 07/21/2016 patient showing clinical improvement, back to her baseline oxygen requirement, ambulating down the hallway. Overall showed clinical improvement with steroids and diuresis -She was discharged on Prednisone taper 5 more days of Doxy  Chronic diastolic heart failure,with mild  Exacerbation as evidenced by cxr with mild Mild vascular congestion noted. Mild bibasilar opacities may reflect mild interstitial edema.  BNP 100.3 last echocardiogram with normal LVF, EF 55-60 Gr 1DD.  - Daily weights, strict I/O, she has history of accumulating fluids in a short time periods, need to close monitor volume status -By 07/22/2016 she appeared euvolemic and discharged on torsemide 20 mg PO BID  Elevated transaminases -Labs on 07/22/2015 showing an upward trend in transaminases AST: 338<196<49 and ALT 207<83<62. -This could be related to hepatic congestion in setting of acute CHF -She was further workup with RUQ U/S that showed diffuse fatty infiltration of liver without acute changes.  -Suspect transaminases related to hepatic congestion. Repeat labs on 07/22/2016 showing LFT's stabilizing.    Discharge Exam: Vitals:   07/21/16 2114 07/22/16 0516  BP: 122/71 121/75  Pulse: 71 65  Resp: 18 20  Temp: 98.1 F (36.7 C) 97.5 F (36.4 C)    General: Nontoxic appearing, awake and  alert, asking to go home Cardiovascular: RRR nl S1S2 Respiratory: Lungs overall clear to auscultation, no wheezing  rhonchi or rales.  Abdomen: Soft nontender nondistended   Discharge Instructions   Discharge Instructions    Call MD for:    Complete by:  As directed   Call MD for:  difficulty breathing, headache or visual disturbances    Complete by:  As directed   Call MD for:  extreme fatigue    Complete by:  As directed   Call MD for:  hives    Complete by:  As directed   Call MD for:  persistant dizziness or light-headedness    Complete by:  As directed   Call MD for:  persistant nausea and vomiting    Complete by:  As directed   Call MD for:  redness, tenderness, or signs of infection (pain, swelling, redness, odor or green/yellow discharge around incision site)    Complete by:  As directed   Call MD for:  severe uncontrolled pain    Complete by:  As directed   Call MD for:  temperature >100.4    Complete by:  As directed   Diet - low sodium heart healthy    Complete by:  As directed   Increase activity slowly    Complete by:  As directed     Current Discharge Medication List    START taking these medications   Details  doxycycline (VIBRA-TABS) 100 MG tablet Take 1 tablet (100 mg total) by mouth every 12 (twelve) hours. Qty: 10 tablet, Refills: 0    guaiFENesin (MUCINEX) 600 MG 12 hr tablet Take 1 tablet (600 mg total) by mouth 2 (two) times daily. Qty: 20 tablet, Refills: 0    predniSONE (STERAPRED UNI-PAK 21 TAB) 10 MG (21) TBPK tablet Take 40 mg PO q daily x 3 days followed by 30 mg PO q daily for 3 days, followed by 20 mg PO q daily for 3 days, followed by 10 mg PO q daily for 3 days then stop. Qty sufficient Qty: 40 tablet, Refills: 0      CONTINUE these medications which have CHANGED   Details  pantoprazole (PROTONIX) 40 MG tablet Take 1 tablet (40 mg total) by mouth 2 (two) times daily before a meal. Qty: 60 tablet, Refills: 0      CONTINUE these medications which have NOT CHANGED   Details  albuterol (PROVENTIL HFA;VENTOLIN HFA) 108 (90 BASE) MCG/ACT inhaler Inhale 2 puffs  into the lungs every 6 (six) hours as needed for wheezing or shortness of breath. Qty: 8.5 g, Refills: 1    BREO ELLIPTA 100-25 MCG/INH AEPB INHALE 1 PUFF INTO THE LUNGS DAILY. Qty: 60 each, Refills: 3    pregabalin (LYRICA) 50 MG capsule Take 1 capsule (50 mg total) by mouth 3 (three) times daily. Qty: 90 capsule, Refills: 1   Associated Diagnoses: Diabetic polyneuropathy associated with type 2 diabetes mellitus (HCC)    roflumilast (DALIRESP) 500 MCG TABS tablet Take 500 mcg by mouth daily.    Tiotropium Bromide Monohydrate (SPIRIVA RESPIMAT) 1.25 MCG/ACT AERS Inhale 2 puffs into the lungs 2 (two) times daily. Qty: 1 Inhaler, Refills: 3    torsemide (DEMADEX) 20 MG tablet Take 1 tablet (20 mg total) by mouth 2 (two) times daily. Qty: 60 tablet, Refills: 0    traMADol (ULTRAM) 50 MG tablet Take 1 tablet (50 mg total) by mouth every 6 (six) hours as needed for severe pain. Qty: 120 tablet, Refills: 0  Associated Diagnoses: Diabetic polyneuropathy associated with type 2 diabetes mellitus ([place])    blood glucose meter kit and supplies KIT Dispense based on patient and insurance preference. Use up to four times daily as directed. (FOR ICD-9 250.00, 250.01). Qty: 1 each, Refills: 0    Insulin Pen Needle 31G X 8 MM MISC Use 1 needle per test to test blood sugar 1-4 times daily as instructed. Qty: 100 each, Refills: 2   Associated Diagnoses: Diabetes mellitus type 2 in obese (HCC)    potassium chloride SA (K-DUR,KLOR-CON) 20 MEQ tablet Take 1 tablet (20 mEq total) by mouth daily. Qty: 30 tablet, Refills: 6      STOP taking these medications     gabapentin (NEURONTIN) 300 MG capsule      Insulin Glargine (BASAGLAR KWIKPEN) 100 UNIT/ML SOPN      metFORMIN (GLUCOPHAGE) 500 MG tablet        No Known Allergies Follow-up Information    Mauricio Po, FNP On 07/29/2016.   Specialty:  Family Medicine Why:  hospital discharge follow up, Appointment 07/29/16 at 11:30am Contact  information: [place] [id number] [phone]        [facility], MD On 07/27/2016.   Specialty:  Pulmonary Disease Why:  copd// 07/27/16 at [name] information: [address] [place] [address] [phone]            The results of significant diagnostics from this hospitalization (including imaging, microbiology, ancillary and laboratory) are listed below for reference.    Significant Diagnostic Studies: Dg Chest 2 View  Result Date: 07/18/2016 CLINICAL DATA:  Acute onset of shortness of breath. Initial encounter. EXAM: CHEST  2 VIEW COMPARISON:  Chest radiograph performed 04/05/2016 FINDINGS: The lungs are well-aerated. Mild vascular congestion is noted. Mild bibasilar opacities may reflect mild interstitial edema. No significant pleural effusion or pneumothorax is seen. The heart is borderline normal in size. No acute osseous abnormalities are seen. IMPRESSION: Mild vascular congestion noted. Mild bibasilar opacities may reflect mild interstitial edema. Electronically Signed   By: Garald Balding M.D.   On: 07/18/2016 04:33   US Abdomen Limited Ruq  Result Date: 07/22/2016 CLINICAL DATA:  Elevated liver function studies. EXAM: US ABDOMEN LIMITED - RIGHT UPPER QUADRANT COMPARISON:  CT abdomen and pelvis 12/13/2014 FINDINGS: Gallbladder: No gallstones or wall thickening visualized. No sonographic Murphy sign noted by sonographer. Common bile duct: Diameter: 3.3 mm, normal Liver: Diffusely increased hepatic parenchymal echotexture consistent with diffuse fatty infiltration. IMPRESSION: Diffuse fatty infiltration of the liver. No evidence of cholelithiasis or cholecystitis. Electronically Signed   By: Lucienne Capers M.D.   On: 07/22/2016 06:16    Microbiology: Recent Results (from the past 240 hour(s))  MRSA PCR Screening     Status: None   Collection Time: 07/19/16  1:29 PM  Result Value Ref Range Status   MRSA by PCR NEGATIVE NEGATIVE Final     Comment:        The GeneXpert MRSA Assay (FDA approved for NASAL specimens only), is one component of a comprehensive MRSA colonization surveillance program. It is not intended to diagnose MRSA infection nor to guide or monitor treatment for MRSA infections.   Culture, expectorated sputum-assessment     Status: None   Collection Time: 07/20/16  9:30 AM  Result Value Ref Range Status   Specimen Description EXPECTORATED SPUTUM  Final   Special Requests NONE  Final   Sputum evaluation   Final    THIS SPECIMEN IS ACCEPTABLE.  RESPIRATORY CULTURE REPORT TO FOLLOW.   Report Status 07/20/2016 FINAL  Final  Culture, respiratory (NON-Expectorated)     Status: None   Collection Time: 07/20/16  9:30 AM  Result Value Ref Range Status   Specimen Description SPUTUM  Final   Special Requests NONE  Final   Gram Stain   Final    ABUNDANT WBC PRESENT,BOTH PMN AND MONONUCLEAR ABUNDANT GRAM POSITIVE COCCI IN CHAINS ABUNDANT GRAM VARIABLE ROD FEW SQUAMOUS EPITHELIAL CELLS PRESENT NO YEAST OR FUNGAL ELEMENTS SEEN    Culture Consistent with normal respiratory flora.  Final   Report Status 07/22/2016 FINAL  Final     Labs: Basic Metabolic Panel:  Recent Labs Lab 07/18/16 0400 07/19/16 0722 07/20/16 0526 07/21/16 0859 07/22/16 0603  NA 135 135 139 137 136  K 3.9 4.1 3.5 3.6 3.9  CL 89* 88* 89* 89* 89*  CO2 34* 37* 41* 40* 36*  GLUCOSE 154* 209* 124* 151* 118*  BUN _0 CREATININE 0.54 0.59 0.67 0.63 0.59  CALCIUM 9.1 9.3 8.9 9.7 9.5  MG  --   --  1.9  --   --    Liver Function Tests:  Recent Labs Lab 07/19/16 0722 07/20/16 0526 07/21/16 0859 07/22/16 0603  AST 49* 196* 338* 258*  ALT 62* 83* 207* 232*  ALKPHOS 124 116 112 96  BILITOT 0.6 0.9 0.9 0.7  PROT 7.1 6.4* 7.3 6.5  ALBUMIN 3.7 3.2* 4.0 3.6   No results for input(s): LIPASE, AMYLASE in the last 168 hours. No results for input(s): AMMONIA in the last 168 hours. CBC:  Recent Labs Lab 07/18/16 0400  07/19/16 0722 07/20/16 0526 07/21/16 0859 07/22/16 0603  WBC 9.6 14.3* 10.0 9.5 10.3  NEUTROABS 6.8  --   --   --   --   HGB 14.9 14.9 14.4 16.3* 15.1*  HCT 47.5* 48.3* 48.1* 51.9* 48.4*  MCV 104.9* 106.6* 107.4* 105.9* 102.8*  PLT 162 177 176 180 181   Cardiac Enzymes: No results for input(s): CKTOTAL, CKMB, CKMBINDEX, TROPONINI in the last 168 hours. BNP: BNP (last 3 results)  Recent Labs  12/19/15 0440 04/01/16 1750 07/18/16 0400  BNP 192.9* 19.4 100.3*    ProBNP (last 3 results)  Recent Labs  04/20/16 1703  PROBNP 30.0    CBG:  Recent Labs Lab 07/21/16 1203 07/21/16 1741 07/21/16 2117 07/22/16 0746 07/22/16 1127  GLUCAP 225* 239* 281* 117* 172*       Signed:  Kelvin Cellar MD.  [facility] 07/22/2016, 2:15 PM

## 2016-07-22 NOTE — Progress Notes (Signed)
Pt given discharge instructions, prescriptions, and care notes. Pt verbalized understanding AEB no further questions or concerns at this time. IV was discontinued, no redness, pain, or swelling noted at this time. Telemetry discontinued and Centralized Telemetry was notified. Pt left the floor via wheelchair with staff in stable condition. 

## 2016-07-27 ENCOUNTER — Ambulatory Visit (INDEPENDENT_AMBULATORY_CARE_PROVIDER_SITE_OTHER): Payer: 59 | Admitting: Internal Medicine

## 2016-07-27 ENCOUNTER — Encounter: Payer: Self-pay | Admitting: Internal Medicine

## 2016-07-27 VITALS — BP 118/70 | HR 92 | Ht 62.0 in | Wt 198.0 lb

## 2016-07-27 DIAGNOSIS — J9612 Chronic respiratory failure with hypercapnia: Secondary | ICD-10-CM

## 2016-07-27 DIAGNOSIS — J9611 Chronic respiratory failure with hypoxia: Secondary | ICD-10-CM | POA: Diagnosis not present

## 2016-07-27 DIAGNOSIS — R490 Dysphonia: Secondary | ICD-10-CM | POA: Diagnosis not present

## 2016-07-27 DIAGNOSIS — I2781 Cor pulmonale (chronic): Secondary | ICD-10-CM

## 2016-07-27 NOTE — Progress Notes (Signed)
Subjective:     Patient ID: Mary [name], female   DOB: 01-16-1976, 40 y.o.   MRN: [id number]  HPI 40 yo female former smoker with very severe COPD and chronic resp failure and severe pulmonary HTN  OSA/OHS on BIPAP  Hx of congenital heart surgery .  [place] 2013 with severe pulmonary HTN . Echo in 2015 w/ nml heart function.  Deemed not heart transplant due to difficult social situation  TEST  Alpha 1 MM 2013  PFTS 03/08/12: fev1 0.58L/1%, FVC 1.18/33%, Ratop 49 and c/w ssevere obstruction. 21% BD response on FVC, RV 219%, DLCO 11/54% - PFT 04/10/12: fev1 0.54L/18%, 31% BD response, RATio 49, TL 88%, RV 204$, DLCO 30%,  - Abg baseline MAy 2013: 7.43/50/81 CTA Chest 12/16/15 >neg PE, stable scarring in lingular/LUL.     FEb 2017: follow-up chronic hypoxemic and hypercapnic respiratory failure on oxygen and nocturnal BiPAP and triple inhaler therapy. Alpha 1 mm  She frequently misses appointments. Most recently Jan  2017 she had a rhinovirus related cor pulmonale decompensation and was admitted. She was diuresed aggressively. They did not think she had a COPD exacerbation according to the chart review. Her PCO2 was over 70. Since discharge she has improved a lot but she feels she is getting a little more fatigued in the last few days although she is denying any symptoms of COPD exacerbation or heart failure exacerbation. In fact she says her weight is down. She is compliant with her diuretics. She will see the heart failure clinic tomorrow. She says she's compliant with her inhalers. Her insurance status is now improved and she feels inhalers are all more affordable. She is willing to be evaluated by lung transplant keen although she's not lost weight and she is obese.  04/20/2016 [facility] Follow up  Pt returns for a post hospital follow up . She was re- admitted 4/27 to 5/7 for acute on chronic hypoxic RF  with COPD exacerbation  And Diastolic CHF decompensation.  Marland Kitchen Has been in hospital 4  times over last 6 months . Tx w/ IV abs, steroids and diuresis . Still feels weak and has cough and congestion with clear mucsu and DOE. Denies chest pain, orthopnea , hemoptysis, n/v/d.  Has cut back on Torsemide to daily . Ankles more swollen.  Wears BIPAP At bedtime  . Remains on prednisone taper.  Remains on spiriva and BREO . And daliresp .  She is on O2 at 2l/m .  She remains on BIPAP At bedtime  .   OV 07/27/2016  Chief Complaint  Patient presents with  . Follow-up    pt hospitalized last Thursday for COPD exacerbation- is doing much better now.  Does note sob with exertion.       Follow-up Gold stage IV COPD and chronic hypoxemic respiratory) chronic hypercapnic respiratory failure  Routine follow-up. She again had an exacerbation in August 2017 and hospitalized. At this point she feels baseline. She tells me that after starting Roflumilast in in April 2017 she felt frequency of exacerbations has gone down. 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 Anyway she is doing baseline right now. I notice that she has hoarseness of voice. She tells me that this is chronic and for many years but she's never seen ENT. Smoking is in remission. We discussed some vaccination issues.    has a past medical history of Axillary adenopathy; Chronic right-sided CHF (congestive heart failure) ([place]) (03/23/2012); COPD (chronic obstructive  pulmonary disease) ([place]); DVT of upper extremity (deep vein thrombosis) [facility]) (April 2013); Exertional shortness of breath; Hepatic cirrhosis ([place]); History of alcoholism ([place]); History of chronic bronchitis; Irregular menses; Migraine; Moderate to severe pulmonary hypertension [facility]) (April 2013); On home oxygen therapy; OSA (obstructive sleep apnea); Periodontal disease; Pneumonia (~ 1985; 01/2011); and Pulmonary embolus [facility]) (April 2013).   reports that she quit smoking about 4 years ago. Her smoking use included Cigarettes. She has  a 25.00 pack-year smoking history. She has never used smokeless tobacco.  Past Surgical History:  Procedure Laterality Date  . APPENDECTOMY  05/03/2013  . CARDIAC CATHETERIZATION  03/2012  . CARDIAC CATHETERIZATION N/A 01/08/2016   Procedure: Right Heart Cath;  Surgeon: Jolaine Artist, MD;  Location: [facility];  Service: Cardiovascular;  Laterality: N/A;  . CARDIAC SURGERY  07/30/1977   "my heart was backwards" (05/03/2013)  . LAPAROSCOPIC APPENDECTOMY N/A 05/03/2013   Procedure: APPENDECTOMY LAPAROSCOPIC;  Surgeon: Gwenyth Ober, MD;  Location: [facility];  Service: General;  Laterality: N/A;  . LUNG SURGERY  1976-05-19  . RIGHT HEART CATHETERIZATION N/A 03/07/2012   Procedure: RIGHT HEART CATH;  Surgeon: Burnell Blanks, MD;  Location: [facility] CATH LAB;  Service: Cardiovascular;  Laterality: N/A;    No Known Allergies  Immunization History  Administered Date(s) Administered  . Influenza Split 01/06/2011, 10/20/2012, 09/06/2013  . Influenza,inj,Quad PF,36+ Mos 09/27/2014, 09/16/2015  . Pneumococcal Conjugate-13 09/16/2015  . Pneumococcal Polysaccharide-23 01/06/2011  . Tdap 09/06/2013    Family History  Problem Relation Age of Onset  . Hypertension Father   . Heart disease Father     CHF; died age 55   . Alcohol abuse Father   . Other Brother     died age 54 y.o overdose     Current Outpatient Prescriptions:  .  albuterol (PROVENTIL HFA;VENTOLIN HFA) 108 (90 BASE) MCG/ACT inhaler, Inhale 2 puffs into the lungs every 6 (six) hours as needed for wheezing or shortness of breath., Disp: 8.5 g, Rfl: 1 .  blood glucose meter kit and supplies KIT, Dispense based on patient and insurance preference. Use up to four times daily as directed. (FOR ICD-9 250.00, 250.01)., Disp: 1 each, Rfl: 0 .  BREO ELLIPTA 100-25 MCG/INH AEPB, INHALE 1 PUFF INTO THE LUNGS DAILY., Disp: 60 each, Rfl: 3 .  doxycycline (VIBRA-TABS) 100 MG tablet, Take 1 tablet (100 mg total) by mouth every 12 (twelve)  hours., Disp: 10 tablet, Rfl: 0 .  guaiFENesin (MUCINEX) 600 MG 12 hr tablet, Take 1 tablet (600 mg total) by mouth 2 (two) times daily., Disp: 20 tablet, Rfl: 0 .  pantoprazole (PROTONIX) 40 MG tablet, Take 1 tablet (40 mg total) by mouth 2 (two) times daily before a meal., Disp: 60 tablet, Rfl: 0 .  potassium chloride SA (K-DUR,KLOR-CON) 20 MEQ tablet, Take 1 tablet (20 mEq total) by mouth daily., Disp: 30 tablet, Rfl: 6 .  predniSONE (STERAPRED UNI-PAK 21 TAB) 10 MG (21) TBPK tablet, Take 40 mg PO q daily x 3 days followed by 30 mg PO q daily for 3 days, followed by 20 mg PO q daily for 3 days, followed by 10 mg PO q daily for 3 days then stop. Qty sufficient, Disp: 40 tablet, Rfl: 0 .  pregabalin (LYRICA) 50 MG capsule, Take 1 capsule (50 mg total) by mouth 3 (three) times daily., Disp: 90 capsule, Rfl: 1 .  roflumilast (DALIRESP) 500 MCG TABS tablet, Take 500 mcg by mouth daily., Disp: , Rfl:  .  Tiotropium Bromide Monohydrate (SPIRIVA RESPIMAT) 1.25 MCG/ACT AERS, Inhale 2 puffs into the lungs 2 (two) times daily. (Patient taking differently: Inhale 2 puffs into the lungs daily. ), Disp: 1 Inhaler, Rfl: 3 .  torsemide (DEMADEX) 20 MG tablet, Take 1 tablet (20 mg total) by mouth 2 (two) times daily. (Patient taking differently: Take 20 mg by mouth daily. ), Disp: 60 tablet, Rfl: 0 .  traMADol (ULTRAM) 50 MG tablet, Take 1 tablet (50 mg total) by mouth every 6 (six) hours as needed for severe pain., Disp: 120 tablet, Rfl: 0 .  Insulin Pen Needle 31G X 8 MM MISC, Use 1 needle per test to test blood sugar 1-4 times daily as instructed. (Patient not taking: Reported on 07/27/2016), Disp: 100 each, Rfl: 2    Review of Systems     Objective:   Physical Exam  Constitutional: She is oriented to person, place, and time. She appears well-developed and well-nourished. No distress.  HENT:  Head: Normocephalic and atraumatic.  Right Ear: External ear normal.  Left Ear: External ear normal.   Mouth/Throat: Oropharynx is clear and moist. No oropharyngeal exudate.  Hoarseness of voice present Oxygen on   Eyes: Conjunctivae and EOM are normal. Pupils are equal, round, and reactive to light. Right eye exhibits no discharge. Left eye exhibits no discharge. No scleral icterus.  Neck: Normal range of motion. Neck supple. No JVD present. No tracheal deviation present. No thyromegaly present.  Cardiovascular: Normal rate, regular rhythm, normal heart sounds and intact distal pulses.  Exam reveals no gallop and no friction rub.   No murmur heard. Pulmonary/Chest: Effort normal and breath sounds normal. No respiratory distress. She has no wheezes. She has no rales. She exhibits no tenderness.  Mildly barrel chest with diffuse reduction in expiration No wheezes no crackles  Abdominal: Soft. Bowel sounds are normal. She exhibits no distension and no mass. There is no tenderness. There is no rebound and no guarding.  Visceral obesity present  Musculoskeletal: Normal range of motion. She exhibits no edema or tenderness.  Lymphadenopathy:    She has no cervical adenopathy.  Neurological: She is alert and oriented to person, place, and time. She has normal reflexes. No cranial nerve deficit. She exhibits normal muscle tone. Coordination normal.  Skin: Skin is warm and dry. No rash noted. She is not diaphoretic. No erythema. No pallor.  Psychiatric: She has a normal mood and affect. Her behavior is normal. Judgment and thought content normal.  Vitals reviewed.   Vitals:   07/27/16 1605  BP: 118/70  Pulse: 92  SpO2: 95%  Weight: 198 lb (89.8 kg)  Height: '5\' 2"'$  (1.575 m)    Estimated body mass index is 36.21 kg/m as calculated from the following:   Height as of this encounter: '5\' 2"'$  (1.575 m).   Weight as of this encounter: 198 lb (89.8 kg).      Assessment:       ICD-9-CM ICD-10-CM   1. Chronic respiratory failure with hypoxia (HCC) 518.83 J96.11    799.02    2. Chronic  respiratory failure with hypercapnia (HCC) 518.83 J96.12   3. Cor pulmonale (HCC) 416.9 I27.81   4. Chronic hoarseness 784.42 R49.0        Plan:      Glad you're better after hospitalization earlier this month  Plan - Continue oxygen 24 7 and BiPAP at night -  Spiriva respimat format daily - Brio take it once daily - Use albuterol as needed - Glad  roflumilast helping you - high dose flu shot in fall - ideal is high dose flu shot but in absence take regular flu shot - talk to Judd Lien, FNP and get shingles shot; is indicated   Chronic hoarseness - refer eNT  Followup  - 6 weeks with me my NP Tammy   Dr. Brand Males, M.D., [facility].C.P Pulmonary and Critical Care Medicine Staff Physician [facility] Pager: [phone], If no answer or between  15:00h - 7:00h: call [phone]  07/27/2016 4:50 PM

## 2016-07-27 NOTE — Patient Instructions (Addendum)
Chronic respiratory failure with hypoxia (HCC) Chronic respiratory failure with hypercapnia (HCC) Cor pulmonale (HCC)   Glad you're better after hospitalization earlier this month  Plan - Continue oxygen 24 7 and BiPAP at night -  Continue Spiriva respimat format daily - continue Brio take it once daily - Use albuterol as needed - Glad roflumilast helping you - high dose flu shot in fall - ideal is high dose flu shot but in absence take regular flu shot - talk to Norina Buzzardpcp Calone, Gregory, FNP and get shingles shot; is indicated   Chronic hoarseness - refer eNT  Followup  - 6 weeks with me my NP Tammy

## 2016-07-29 ENCOUNTER — Ambulatory Visit (INDEPENDENT_AMBULATORY_CARE_PROVIDER_SITE_OTHER): Payer: 59 | Admitting: Family

## 2016-07-29 ENCOUNTER — Inpatient Hospital Stay: Payer: 59 | Admitting: Family

## 2016-07-29 ENCOUNTER — Encounter: Payer: Self-pay | Admitting: Family

## 2016-07-29 VITALS — BP 138/86 | HR 96 | Temp 98.5°F | Resp 16 | Ht 62.0 in | Wt 199.0 lb

## 2016-07-29 DIAGNOSIS — Z23 Encounter for immunization: Secondary | ICD-10-CM | POA: Diagnosis not present

## 2016-07-29 DIAGNOSIS — J9612 Chronic respiratory failure with hypercapnia: Secondary | ICD-10-CM

## 2016-07-29 DIAGNOSIS — E119 Type 2 diabetes mellitus without complications: Secondary | ICD-10-CM

## 2016-07-29 DIAGNOSIS — E669 Obesity, unspecified: Secondary | ICD-10-CM

## 2016-07-29 DIAGNOSIS — E1169 Type 2 diabetes mellitus with other specified complication: Secondary | ICD-10-CM

## 2016-07-29 MED ORDER — TORSEMIDE 20 MG PO TABS: 20.0000 mg | ORAL_TABLET | Freq: Every day | ORAL | 0 refills | Status: DC

## 2016-07-29 NOTE — Progress Notes (Signed)
Subjective:    Patient ID: Mary [name], female    DOB: 1976-10-07, 40 y.o.   MRN: [id number]  Chief Complaint  Patient presents with  . Hospitalization Follow-up    states she is doing better, saw pulmonology a couple days ago    HPI:  Mary [name] is a 40 y.o. female who  has a past medical history of Axillary adenopathy; Chronic right-sided CHF (congestive heart failure) ([place]) (03/23/2012); COPD (chronic obstructive pulmonary disease) ([place]); DVT of upper extremity (deep vein thrombosis) [facility]) (April 2013); Exertional shortness of breath; Hepatic cirrhosis ([place]); History of alcoholism ([place]); History of chronic bronchitis; Irregular menses; Migraine; Moderate to severe pulmonary hypertension [facility]) (April 2013); On home oxygen therapy; OSA (obstructive sleep apnea); Periodontal disease; Pneumonia (~ 1985; 01/2011); and Pulmonary embolus [facility]) (April 2013). and presents today for a follow up office visit.    COPD exacerbation - Recently evaluated in the emergency department and admitted to the hospital with the chief complaint of sudden onset constant shortness of breath that have been going on for approximately one day prior to presentation. Noted worse shortness of breath on her baseline that was refractory to a DuoNeb and noted her oxygen levels to be 74% at home. Physical exam with tachycardia and wheezing noted she was also to With increased work of breathing and wheezing. She was treated with a DuoNeb and Solu-Medrol. Chest x-ray with no evidence of infections. During hospitalization she was treated with steroids and doxycycline. She was also diuresed with torsemide. Following admission she was noted to have improvement back to her baseline ambulation and oxygen requirements. She was discharged on a prednisone taper for 5 days and doxycycline. Encouraged to watch fluid status as she had gained a mild amount of fluid on the hospital. Upon discharge she was euvolemic. All hospital records,  imaging, and labs were reviewed in detail. Since her hospitalization she has followed up with pulmonology with no significant changes.  Notes that has had improvement since leaving the hospital. Reports taking her medications as prescribed and denies adverse side effects. She has not taken the insulin and gabapentin as her last A1c in the hospital was 5.4.. Her blood sugars at home have been 137 even while she is on prednisone. She continues to take her Spiriva, Breo  roflumilast, and albuterol which help to manage her symptoms. She is on pressurized 2 L via N/C.    No Known Allergies    Outpatient Medications Prior to Visit  Medication Sig Dispense Refill  . albuterol (PROVENTIL HFA;VENTOLIN HFA) 108 (90 BASE) MCG/ACT inhaler Inhale 2 puffs into the lungs every 6 (six) hours as needed for wheezing or shortness of breath. 8.5 g 1  . blood glucose meter kit and supplies KIT Dispense based on patient and insurance preference. Use up to four times daily as directed. (FOR ICD-9 250.00, 250.01). 1 each 0  . BREO ELLIPTA 100-25 MCG/INH AEPB INHALE 1 PUFF INTO THE LUNGS DAILY. 60 each 3  . doxycycline (VIBRA-TABS) 100 MG tablet Take 1 tablet (100 mg total) by mouth every 12 (twelve) hours. 10 tablet 0  . guaiFENesin (MUCINEX) 600 MG 12 hr tablet Take 1 tablet (600 mg total) by mouth 2 (two) times daily. 20 tablet 0  . pantoprazole (PROTONIX) 40 MG tablet Take 1 tablet (40 mg total) by mouth 2 (two) times daily before a meal. 60 tablet 0  . potassium chloride SA (K-DUR,KLOR-CON) 20 MEQ tablet Take 1 tablet (20 mEq total) by mouth daily.  30 tablet 6  . predniSONE (STERAPRED UNI-PAK 21 TAB) 10 MG (21) TBPK tablet Take 40 mg PO q daily x 3 days followed by 30 mg PO q daily for 3 days, followed by 20 mg PO q daily for 3 days, followed by 10 mg PO q daily for 3 days then stop. Qty sufficient 40 tablet 0  . pregabalin (LYRICA) 50 MG capsule Take 1 capsule (50 mg total) by mouth 3 (three) times daily. 90 capsule  1  . roflumilast (DALIRESP) 500 MCG TABS tablet Take 500 mcg by mouth daily.    . Tiotropium Bromide Monohydrate (SPIRIVA RESPIMAT) 1.25 MCG/ACT AERS Inhale 2 puffs into the lungs 2 (two) times daily. (Patient taking differently: Inhale 2 puffs into the lungs daily. ) 1 Inhaler 3  . traMADol (ULTRAM) 50 MG tablet Take 1 tablet (50 mg total) by mouth every 6 (six) hours as needed for severe pain. 120 tablet 0  . Insulin Pen Needle 31G X 8 MM MISC Use 1 needle per test to test blood sugar 1-4 times daily as instructed. 100 each 2  . torsemide (DEMADEX) 20 MG tablet Take 1 tablet (20 mg total) by mouth 2 (two) times daily. (Patient taking differently: Take 20 mg by mouth daily. ) 60 tablet 0   No facility-administered medications prior to visit.       Past Surgical History:  Procedure Laterality Date  . APPENDECTOMY  05/03/2013  . CARDIAC CATHETERIZATION  03/2012  . CARDIAC CATHETERIZATION N/A 01/08/2016   Procedure: Right Heart Cath;  Surgeon: Jolaine Artist, MD;  Location: [facility];  Service: Cardiovascular;  Laterality: N/A;  . CARDIAC SURGERY  1977[birth year]   "my heart was backwards" (05/03/2013)  . LAPAROSCOPIC APPENDECTOMY N/A 05/03/2013   Procedure: APPENDECTOMY LAPAROSCOPIC;  Surgeon: Gwenyth Ober, MD;  Location: [facility];  Service: General;  Laterality: N/A;  . LUNG SURGERY  March 21, 1977  . RIGHT HEART CATHETERIZATION N/A 03/07/2012   Procedure: RIGHT HEART CATH;  Surgeon: Burnell Blanks, MD;  Location: [facility] C[facility] LAB;  Service: Cardiovascular;  Laterality: N/A;      Past Medical History:  Diagnosis Date  . Axillary adenopathy    right axillary adenopathy noted on CT chest (03/06/2012)  . Chronic right-sided CHF (congestive heart failure) ([place]) 03/23/2012   with cor pulmonale. Last RHC 03/2012  . COPD (chronic obstructive pulmonary disease) ([place])    PFTS 03/08/12: fev1 0.58L/1%, FVC 1.18/33%, Ratop 49 and c/w ssevere obstruction. 21% BD response on FVC, RV 219%, DLCO 11/54%    . DVT of upper extremity (deep vein thrombosis) [facility]) April 2013   right subclavian // Unclear precipitating cause - possibly significant right heart failure, with vascular stasis potentially predisposing to clotting.    . Exertional shortness of breath   . Hepatic cirrhosis ([place])    Questionable history of - Noted on CT abdomen (03/2012) - thought to be due to vascular congestion from right heart failure +/- patient's history of alcohol abuse  . History of alcoholism ([place])   . History of chronic bronchitis   . Irregular menses   . Migraine    "~ 1/yr" (05/03/2013)  . Moderate to severe pulmonary hypertension [facility]) April 2013   Cardiac cath on 03/06/12 - 1. Elevated pulmonary artery pressures, right sided filling pressures.,  2. PA: 64/45 (mean 53)    . On home oxygen therapy    "2-3 L 24/7" (05/03/2013)  . OSA (obstructive sleep apnea)    wears noctural BiPAP (  05/03/2013)  . Periodontal disease   . Pneumonia ~ 1985; 01/2011  . Pulmonary embolus [facility]) April 2013   Precipitating cause unclear. Was treated with coumadin from April-June 2013.    Review of Systems  Constitutional: Negative for chills and fever.  Eyes:       Denies changes in vision  Respiratory: Positive for shortness of breath. Negative for chest tightness and wheezing.   Cardiovascular: Negative for chest pain, palpitations and leg swelling.  Endocrine: Negative for polydipsia, polyphagia and polyuria.  Neurological: Negative for numbness.      Objective:    BP 138/86 (BP Location: Left Arm, Patient Position: Sitting, Cuff Size: Normal)   Pulse 96   Temp 98.5 F (36.9 C) (Oral)   Resp 16   Ht _0  (1.575 m)   Wt 199 lb (90.3 kg)   SpO2 95%   BMI 36.40 kg/m  Nursing note and vital signs reviewed.  Physical Exam  Constitutional: She is oriented to person, place, and time. She appears well-developed and well-nourished. No distress.  Cardiovascular: Normal rate, regular rhythm, normal heart sounds and intact  distal pulses.   Pulmonary/Chest: Effort normal and breath sounds normal. She has no wheezes. She has no rales. She exhibits no tenderness.  Neurological: She is alert and oriented to person, place, and time.  Skin: Skin is warm and dry.  Psychiatric: She has a normal mood and affect. Her behavior is normal. Judgment and thought content normal.       Assessment & Plan:   Problem List Items Addressed This Visit      Respiratory   Chronic respiratory failure with hypercapnia (HCC) - Primary (Chronic)    Symptoms markedly improved since hospitalization and stable with current regimen. Maintained on 2 L of oxygen via pressurized nasal cannula. Currently completing the prednisone and doxycycline. Continue current dosage of Daliresp, Breo, Albuterol and Spiriva with changes per pulmonology. Continue to monitor.         Endocrine   Diabetes mellitus type 2 in obese (HCC)    Type 2 diabetes appears well controlled with lifestyle management and most recent A1c of 5.4. Discontinue insulin. Continue to monitor blood sugars at home periodically. Not currently maintained on ACE/ARB or statin medication. Diabetic eye exam encouraged to be completed independently. Urine microalbumin with next blood work.       Other Visit Diagnoses    Encounter for immunization       Relevant Orders   Flu vaccine HIGH DOSE PF (Completed)     Immunization History  Administered Date(s) Administered  . Influenza Split 01/06/2011, 10/20/2012, 09/06/2013  . Influenza, High Dose Seasonal PF 07/29/2016  . Influenza,inj,Quad PF,36+ Mos 09/27/2014, 09/16/2015  . Pneumococcal Conjugate-13 09/16/2015  . Pneumococcal Polysaccharide-23 01/06/2011  . Tdap 09/06/2013     I have discontinued Ms. Vora Insulin Pen Needle. I have also changed her torsemide. Additionally, I am having her maintain her albuterol, potassium chloride SA, Tiotropium Bromide Monohydrate, blood glucose meter kit and supplies, pregabalin, BREO  ELLIPTA, traMADol, roflumilast, doxycycline, guaiFENesin, predniSONE, and pantoprazole.   Meds ordered this encounter  Medications  . torsemide (DEMADEX) 20 MG tablet    Sig: Take 1 tablet (20 mg total) by mouth daily.    Dispense:  90 tablet    Refill:  0    Order Specific Question:   Supervising Provider    Answer:   Pricilla Holm A [[id number]]     Follow-up: Return in about 3 months (around 10/29/2016), or if  symptoms worsen or fail to improve.  Mauricio Po, FNP

## 2016-07-29 NOTE — Assessment & Plan Note (Signed)
Type 2 diabetes appears well controlled with lifestyle management and most recent A1c of 5.4. Discontinue insulin. Continue to monitor blood sugars at home periodically. Not currently maintained on ACE/ARB or statin medication. Diabetic eye exam encouraged to be completed independently. Urine microalbumin with next blood work.

## 2016-07-29 NOTE — Patient Instructions (Addendum)
Thank you for choosing [facility].  SUMMARY AND INSTRUCTIONS:  Medication:  Please continue to take your medications as prescribed.   Your prescription(s) have been submitted to your pharmacy or been printed and provided for you. Please take as directed and contact our office if you believe you are having problem(s) with the medication(s) or have any questions.  Follow up:  If your symptoms worsen or fail to improve, please contact our office for further instruction, or in case of emergency go directly to the emergency room at the closest medical facility.     

## 2016-07-29 NOTE — Assessment & Plan Note (Signed)
Symptoms markedly improved since hospitalization and stable with current regimen. Maintained on 2 L of oxygen via pressurized nasal cannula. Currently completing the prednisone and doxycycline. Continue current dosage of Daliresp, Breo, Albuterol and Spiriva with changes per pulmonology. Continue to monitor.

## 2016-08-09 ENCOUNTER — Encounter (HOSPITAL_COMMUNITY): Payer: Self-pay

## 2016-08-09 ENCOUNTER — Inpatient Hospital Stay (HOSPITAL_COMMUNITY)
Admission: EM | Admit: 2016-08-09 | Discharge: 2016-08-13 | DRG: 190 | Disposition: A | Discharge status: Home Health | Payer: 59 | Attending: Internal Medicine | Admitting: Internal Medicine

## 2016-08-09 ENCOUNTER — Emergency Department (HOSPITAL_COMMUNITY): Payer: 59

## 2016-08-09 DIAGNOSIS — Z9981 Dependence on supplemental oxygen: Secondary | ICD-10-CM

## 2016-08-09 DIAGNOSIS — J441 Chronic obstructive pulmonary disease with (acute) exacerbation: Secondary | ICD-10-CM | POA: Diagnosis not present

## 2016-08-09 DIAGNOSIS — R0602 Shortness of breath: Secondary | ICD-10-CM | POA: Diagnosis not present

## 2016-08-09 DIAGNOSIS — I5032 Chronic diastolic (congestive) heart failure: Secondary | ICD-10-CM | POA: Diagnosis present

## 2016-08-09 DIAGNOSIS — Z86718 Personal history of other venous thrombosis and embolism: Secondary | ICD-10-CM | POA: Diagnosis not present

## 2016-08-09 DIAGNOSIS — Z79899 Other long term (current) drug therapy: Secondary | ICD-10-CM | POA: Diagnosis not present

## 2016-08-09 DIAGNOSIS — J9621 Acute and chronic respiratory failure with hypoxia: Secondary | ICD-10-CM | POA: Diagnosis present

## 2016-08-09 DIAGNOSIS — E1142 Type 2 diabetes mellitus with diabetic polyneuropathy: Secondary | ICD-10-CM | POA: Diagnosis present

## 2016-08-09 DIAGNOSIS — I272 Other secondary pulmonary hypertension: Secondary | ICD-10-CM | POA: Diagnosis present

## 2016-08-09 DIAGNOSIS — Z7951 Long term (current) use of inhaled steroids: Secondary | ICD-10-CM | POA: Diagnosis not present

## 2016-08-09 DIAGNOSIS — K219 Gastro-esophageal reflux disease without esophagitis: Secondary | ICD-10-CM | POA: Diagnosis present

## 2016-08-09 DIAGNOSIS — R911 Solitary pulmonary nodule: Secondary | ICD-10-CM | POA: Diagnosis present

## 2016-08-09 DIAGNOSIS — Z6836 Body mass index (BMI) 36.0-36.9, adult: Secondary | ICD-10-CM

## 2016-08-09 DIAGNOSIS — Z8249 Family history of ischemic heart disease and other diseases of the circulatory system: Secondary | ICD-10-CM | POA: Diagnosis not present

## 2016-08-09 DIAGNOSIS — E669 Obesity, unspecified: Secondary | ICD-10-CM | POA: Diagnosis present

## 2016-08-09 DIAGNOSIS — Z86711 Personal history of pulmonary embolism: Secondary | ICD-10-CM | POA: Diagnosis not present

## 2016-08-09 DIAGNOSIS — G4733 Obstructive sleep apnea (adult) (pediatric): Secondary | ICD-10-CM | POA: Diagnosis present

## 2016-08-09 DIAGNOSIS — J9611 Chronic respiratory failure with hypoxia: Secondary | ICD-10-CM | POA: Diagnosis present

## 2016-08-09 DIAGNOSIS — R0902 Hypoxemia: Secondary | ICD-10-CM

## 2016-08-09 DIAGNOSIS — K760 Fatty (change of) liver, not elsewhere classified: Secondary | ICD-10-CM | POA: Diagnosis present

## 2016-08-09 DIAGNOSIS — E119 Type 2 diabetes mellitus without complications: Secondary | ICD-10-CM | POA: Diagnosis not present

## 2016-08-09 DIAGNOSIS — G8929 Other chronic pain: Secondary | ICD-10-CM | POA: Diagnosis present

## 2016-08-09 DIAGNOSIS — E876 Hypokalemia: Secondary | ICD-10-CM | POA: Diagnosis present

## 2016-08-09 DIAGNOSIS — E1165 Type 2 diabetes mellitus with hyperglycemia: Secondary | ICD-10-CM | POA: Diagnosis present

## 2016-08-09 DIAGNOSIS — Z87891 Personal history of nicotine dependence: Secondary | ICD-10-CM | POA: Diagnosis not present

## 2016-08-09 DIAGNOSIS — K7460 Unspecified cirrhosis of liver: Secondary | ICD-10-CM | POA: Diagnosis present

## 2016-08-09 DIAGNOSIS — E1169 Type 2 diabetes mellitus with other specified complication: Secondary | ICD-10-CM

## 2016-08-09 DIAGNOSIS — E1140 Type 2 diabetes mellitus with diabetic neuropathy, unspecified: Secondary | ICD-10-CM | POA: Diagnosis present

## 2016-08-09 LAB — CBC WITH DIFFERENTIAL/PLATELET
Basophils Absolute: 0 10*3/uL (ref 0.0–0.1)
Basophils Relative: 0 %
Eosinophils Absolute: 0.3 10*3/uL (ref 0.0–0.7)
Eosinophils Relative: 3 %
HCT: 48 % — ABNORMAL HIGH (ref 36.0–46.0)
Hemoglobin: 15.5 g/dL — ABNORMAL HIGH (ref 12.0–15.0)
Lymphocytes Relative: 13 %
Lymphs Abs: 1.4 10*3/uL (ref 0.7–4.0)
MCH: 32.8 pg (ref 26.0–34.0)
MCHC: 32.3 g/dL (ref 30.0–36.0)
MCV: 101.5 fL — ABNORMAL HIGH (ref 78.0–100.0)
Monocytes Absolute: 1 10*3/uL (ref 0.1–1.0)
Monocytes Relative: 9 %
Neutro Abs: 8.1 10*3/uL — ABNORMAL HIGH (ref 1.7–7.7)
Neutrophils Relative %: 75 %
Platelets: 127 10*3/uL — ABNORMAL LOW (ref 150–400)
RBC: 4.73 MIL/uL (ref 3.87–5.11)
RDW: 13.6 % (ref 11.5–15.5)
WBC: 10.7 10*3/uL — ABNORMAL HIGH (ref 4.0–10.5)

## 2016-08-09 LAB — BASIC METABOLIC PANEL
Anion gap: 12 (ref 5–15)
BUN: <5 mg/dL — ABNORMAL LOW (ref 6–20)
CO2: 37 mmol/L — ABNORMAL HIGH (ref 22–32)
Calcium: 9.2 mg/dL (ref 8.9–10.3)
Chloride: 85 mmol/L — ABNORMAL LOW (ref 101–111)
Creatinine, Ser: 0.54 mg/dL (ref 0.44–1.00)
GFR calc Af Amer: >60 mL/min (ref >60)
GFR calc non Af Amer: >60 mL/min (ref >60)
Glucose, Bld: 131 mg/dL — ABNORMAL HIGH (ref 65–99)
Potassium: 3.3 mmol/L — ABNORMAL LOW (ref 3.5–5.1)
Sodium: 134 mmol/L — ABNORMAL LOW (ref 135–145)

## 2016-08-09 LAB — I-STAT ARTERIAL BLOOD GAS, ED
Acid-Base Excess: 16 mmol/L — ABNORMAL HIGH (ref 0.0–2.0)
BICARBONATE: 45.4 mmol/L — AB (ref 20.0–28.0)
O2 Saturation: 85 %
PO2 ART: 53 mmHg — AB (ref 83.0–108.0)
Patient temperature: 98.2
TCO2: 48 mmol/L (ref 0–100)
pCO2 arterial: 74.8 mmHg (ref 32.0–48.0)
pH, Arterial: 7.39 (ref 7.350–7.450)

## 2016-08-09 LAB — GLUCOSE, CAPILLARY: GLUCOSE-CAPILLARY: 309 mg/dL — AB (ref 65–99)

## 2016-08-09 MED ORDER — TIOTROPIUM BROMIDE MONOHYDRATE 1.25 MCG/ACT IN AERS: 2.0000 | INHALATION_SPRAY | Freq: Two times a day (BID) | RESPIRATORY_TRACT | Status: DC

## 2016-08-09 MED ORDER — POTASSIUM CHLORIDE CRYS ER 20 MEQ PO TBCR
40.0000 meq | EXTENDED_RELEASE_TABLET | Freq: Every day | ORAL | Status: DC
  Administered 2016-08-09 – 2016-08-13 (×5): 40 meq via ORAL
  Filled 2016-08-09 (×5): qty 2

## 2016-08-09 MED ORDER — PREGABALIN 25 MG PO CAPS
50.0000 mg | ORAL_CAPSULE | Freq: Three times a day (TID) | ORAL | Status: DC
  Administered 2016-08-09 – 2016-08-13 (×12): 50 mg via ORAL
  Filled 2016-08-09 (×12): qty 2

## 2016-08-09 MED ORDER — IPRATROPIUM-ALBUTEROL 0.5-2.5 (3) MG/3ML IN SOLN
3.0000 mL | RESPIRATORY_TRACT | Status: DC | PRN
  Administered 2016-08-09: 3 mL via RESPIRATORY_TRACT
  Filled 2016-08-09: qty 3

## 2016-08-09 MED ORDER — INSULIN ASPART 100 UNIT/ML ~~LOC~~ SOLN
0.0000 [IU] | Freq: Three times a day (TID) | SUBCUTANEOUS | Status: DC
  Administered 2016-08-10: 8 [IU] via SUBCUTANEOUS
  Administered 2016-08-10: 15 [IU] via SUBCUTANEOUS

## 2016-08-09 MED ORDER — OXYCODONE HCL 5 MG PO TABS
5.0000 mg | ORAL_TABLET | ORAL | Status: DC | PRN
  Administered 2016-08-10 – 2016-08-13 (×8): 5 mg via ORAL
  Filled 2016-08-09 (×8): qty 1

## 2016-08-09 MED ORDER — ACETAMINOPHEN 325 MG PO TABS: 650.0000 mg | ORAL_TABLET | Freq: Four times a day (QID) | ORAL | Status: DC | PRN

## 2016-08-09 MED ORDER — IPRATROPIUM-ALBUTEROL 0.5-2.5 (3) MG/3ML IN SOLN
3.0000 mL | RESPIRATORY_TRACT | Status: DC
  Administered 2016-08-09: 3 mL via RESPIRATORY_TRACT
  Filled 2016-08-09: qty 3

## 2016-08-09 MED ORDER — TRAMADOL HCL 50 MG PO TABS
50.0000 mg | ORAL_TABLET | Freq: Four times a day (QID) | ORAL | Status: DC | PRN
  Administered 2016-08-09 – 2016-08-13 (×6): 50 mg via ORAL
  Filled 2016-08-09 (×7): qty 1

## 2016-08-09 MED ORDER — ONDANSETRON HCL 4 MG/2ML IJ SOLN
4.0000 mg | Freq: Once | INTRAMUSCULAR | Status: AC
  Administered 2016-08-09: 4 mg via INTRAVENOUS
  Filled 2016-08-09: qty 2

## 2016-08-09 MED ORDER — POTASSIUM CHLORIDE CRYS ER 20 MEQ PO TBCR: 20.0000 meq | EXTENDED_RELEASE_TABLET | Freq: Every day | ORAL | Status: DC

## 2016-08-09 MED ORDER — ACETAMINOPHEN 650 MG RE SUPP: 650.0000 mg | Freq: Four times a day (QID) | RECTAL | Status: DC | PRN

## 2016-08-09 MED ORDER — METHYLPREDNISOLONE SODIUM SUCC 125 MG IJ SOLR
125.0000 mg | Freq: Once | INTRAMUSCULAR | Status: AC
  Administered 2016-08-09: 125 mg via INTRAVENOUS
  Filled 2016-08-09: qty 2

## 2016-08-09 MED ORDER — PANTOPRAZOLE SODIUM 40 MG PO TBEC
40.0000 mg | DELAYED_RELEASE_TABLET | Freq: Two times a day (BID) | ORAL | Status: DC
  Administered 2016-08-10 – 2016-08-13 (×8): 40 mg via ORAL
  Filled 2016-08-09 (×10): qty 1

## 2016-08-09 MED ORDER — ROFLUMILAST 500 MCG PO TABS
500.0000 ug | ORAL_TABLET | Freq: Every day | ORAL | Status: DC
  Administered 2016-08-09 – 2016-08-13 (×5): 500 ug via ORAL
  Filled 2016-08-09 (×5): qty 1

## 2016-08-09 MED ORDER — IPRATROPIUM BROMIDE 0.02 % IN SOLN
0.5000 mg | RESPIRATORY_TRACT | Status: AC
  Administered 2016-08-09: 0.5 mg via RESPIRATORY_TRACT
  Filled 2016-08-09: qty 2.5

## 2016-08-09 MED ORDER — ALBUTEROL (5 MG/ML) CONTINUOUS INHALATION SOLN
15.0000 mg/h | INHALATION_SOLUTION | RESPIRATORY_TRACT | Status: AC
  Administered 2016-08-09: 15 mg/h via RESPIRATORY_TRACT
  Filled 2016-08-09: qty 20
  Filled 2016-08-09: qty 60

## 2016-08-09 MED ORDER — GUAIFENESIN ER 600 MG PO TB12
1200.0000 mg | ORAL_TABLET | Freq: Two times a day (BID) | ORAL | Status: DC
  Administered 2016-08-09 – 2016-08-13 (×8): 1200 mg via ORAL
  Filled 2016-08-09 (×8): qty 2

## 2016-08-09 MED ORDER — INSULIN ASPART 100 UNIT/ML ~~LOC~~ SOLN
0.0000 [IU] | Freq: Every day | SUBCUTANEOUS | Status: DC
  Administered 2016-08-09: 4 [IU] via SUBCUTANEOUS

## 2016-08-09 MED ORDER — METHYLPREDNISOLONE SODIUM SUCC 125 MG IJ SOLR
60.0000 mg | Freq: Four times a day (QID) | INTRAMUSCULAR | Status: DC
  Administered 2016-08-09 – 2016-08-11 (×7): 60 mg via INTRAVENOUS
  Filled 2016-08-09 (×7): qty 2

## 2016-08-09 MED ORDER — FLUTICASONE FUROATE-VILANTEROL 100-25 MCG/INH IN AEPB
1.0000 | INHALATION_SPRAY | Freq: Every day | RESPIRATORY_TRACT | Status: DC
  Administered 2016-08-09 – 2016-08-13 (×4): 1 via RESPIRATORY_TRACT
  Filled 2016-08-09: qty 28

## 2016-08-09 MED ORDER — PROMETHAZINE HCL 25 MG PO TABS
12.5000 mg | ORAL_TABLET | Freq: Four times a day (QID) | ORAL | Status: DC | PRN
  Administered 2016-08-12: 12.5 mg via ORAL
  Filled 2016-08-09: qty 1

## 2016-08-09 MED ORDER — ENOXAPARIN SODIUM 40 MG/0.4ML ~~LOC~~ SOLN
40.0000 mg | SUBCUTANEOUS | Status: DC
  Administered 2016-08-09 – 2016-08-12 (×4): 40 mg via SUBCUTANEOUS
  Filled 2016-08-09 (×4): qty 0.4

## 2016-08-09 MED ORDER — IPRATROPIUM-ALBUTEROL 0.5-2.5 (3) MG/3ML IN SOLN
3.0000 mL | Freq: Four times a day (QID) | RESPIRATORY_TRACT | Status: DC
  Administered 2016-08-10 (×2): 3 mL via RESPIRATORY_TRACT
  Filled 2016-08-09 (×2): qty 3

## 2016-08-09 MED ORDER — TORSEMIDE 20 MG PO TABS
20.0000 mg | ORAL_TABLET | Freq: Every day | ORAL | Status: DC
  Administered 2016-08-09 – 2016-08-13 (×5): 20 mg via ORAL
  Filled 2016-08-09 (×5): qty 1

## 2016-08-09 MED ORDER — LEVOFLOXACIN IN D5W 750 MG/150ML IV SOLN
750.0000 mg | INTRAVENOUS | Status: DC
  Administered 2016-08-09: 750 mg via INTRAVENOUS
  Filled 2016-08-09 (×2): qty 150

## 2016-08-09 NOTE — ED Provider Notes (Signed)
[facility]EMERGENCY DEPT Provider Note   CSN: [id number] Arrival date & time: 08/09/16  0852     History   Chief Complaint Chief Complaint  Patient presents with  . Shortness of Breath    HPI Mary [name] is a 40 y.o. female.  HPI Patient presents to the emergency department with increasing shortness of breath over the last 24 hours.  Patient states that she has COPD and CHF and she feels like that her breathing is gotten worse over the last 24 hours.  She states that she has been admitted recently with a COPD exacerbation and she feels worse.  This time around.  She states that her oxygenation is fine.  While sitting, but as she ambulates, she drops into the low 80s.  She states last night while lying in bed her pulse oximetry was in the low 80s as well.  She states that her BiPAP helped some but did not completely help with her symptoms.  The patient states that she did give herself a nebulized treatment this morning and did not feel much improvement.  She states that her oxygenation normally runs around 93%.  She states that this morning she had a low pulse oximetry readings as well.  She states that nothing seems make her condition better. The patient denies chest pain, headache,blurred vision, neck pain, fever, cough, weakness, numbness, dizziness, anorexia, edema, abdominal pain, nausea, vomiting, diarrhea, rash, back pain, dysuria, hematemesis, bloody stool, near syncope, or syncope. Past Medical History:  Diagnosis Date  . Axillary adenopathy    right axillary adenopathy noted on CT chest (03/06/2012)  . Chronic right-sided CHF (congestive heart failure) (HCC) 03/23/2012   with cor pulmonale. Last RHC 03/2012  . COPD (chronic obstructive pulmonary disease) (HCC)    PFTS 03/08/12: fev1 0.58L/1%, FVC 1.18/33%, Ratop 49 and c/w ssevere obstruction. 21% BD response on FVC, RV 219%, DLCO 11/54%  . DVT of upper extremity (deep vein thrombosis) (HCC) April 2013   right subclavian // Unclear  precipitating cause - possibly significant right heart failure, with vascular stasis potentially predisposing to clotting.    . Exertional shortness of breath   . Hepatic cirrhosis (HCC)    Questionable history of - Noted on CT abdomen (03/2012) - thought to be due to vascular congestion from right heart failure +/- patient's history of alcohol abuse  . History of alcoholism (HCC)   . History of chronic bronchitis   . Irregular menses   . Migraine    "~ 1/yr" (05/03/2013)  . Moderate to severe pulmonary hypertension (HCC) April 2013   Cardiac cath on 03/06/12 - 1. Elevated pulmonary artery pressures, right sided filling pressures.,  2. PA: 64/45 (mean 53)    . On home oxygen therapy    "2-3 L 24/7" (05/03/2013)  . OSA (obstructive sleep apnea)    wears noctural BiPAP (05/03/2013)  . Periodontal disease   . Pneumonia ~ 1985; 01/2011  . Pulmonary embolus (HCC) April 2013   Precipitating cause unclear. Was treated with coumadin from April-June 2013.    Patient Active Problem List   Diagnosis Date Noted  . Chronic hoarseness 07/27/2016  . Nausea with vomiting 05/31/2016  . Diabetic neuropathy (HCC) 04/30/2016  . Diabetes mellitus type 2 in obese (HCC) 04/10/2016  . SOB (shortness of breath)   . OSA treated with BiPAP 04/02/2016  . COPD exacerbation (HCC) 04/01/2016  . Chronic diastolic heart failure (HCC) 01/25/2016  . Chronic respiratory failure with hypoxia (HCC) 01/21/2016  . Right heart   failure ([place])   . PAH (pulmonary artery hypertension) ([place])   . COPD (chronic obstructive pulmonary disease) ([place]) 12/16/2015  . Constipation 11/18/2015  . Morbid obesity ([place]) 11/18/2015  . Lung nodule 01/30/2015  . Pulmonary mass 01/13/2015  . Other emphysema ([place])   . Gastroparesis   . Esophageal reflux   . Obstipation   . Dehydration 12/14/2014  . Abdominal pain 12/14/2014  . Fever 12/14/2014  . Dyspnea 09/18/2013  . Hypokalemia 02/28/2013  . Acute on chronic respiratory failure with  hypoxia ([place]) 02/28/2013  . Financial difficulties 02/28/2013  . Obesity hypoventilation syndrome ([place]) 02/28/2013  . Elevated troponin 02/28/2013  . Myalgia 02/28/2013  . Influenza with respiratory manifestations 02/28/2013  . Anovulation 06/30/2012  . Preventative health care 05/05/2012  . Irregular menstrual cycle 04/27/2012  . Chronic right-sided CHF (congestive heart failure) ([place]) 03/23/2012  . Pulmonary hypertension ([place]) 03/23/2012  . Cor pulmonale ([place]) 03/21/2012  . Chronic respiratory failure with hypercapnia ([place]) 03/21/2012  . OSA on BiPAP 03/19/2012  . Axillary lymphadenopathy 03/06/2012  . Congenital heart disease 01/28/2012  . Obesity 01/28/2012  . Dental caries 01/28/2012    Past Surgical History:  Procedure Laterality Date  . APPENDECTOMY  05/03/2013  . CARDIAC CATHETERIZATION  03/2012  . CARDIAC CATHETERIZATION N/A 01/08/2016   Procedure: Right Heart Cath;  Surgeon: Jolaine Artist, MD;  Location: [facility];  Service: Cardiovascular;  Laterality: N/A;  . CARDIAC SURGERY  03/27/77   "my heart was backwards" (05/03/2013)  . LAPAROSCOPIC APPENDECTOMY N/A 05/03/2013   Procedure: APPENDECTOMY LAPAROSCOPIC;  Surgeon: Gwenyth Ober, MD;  Location: [facility];  Service: General;  Laterality: N/A;  . LUNG SURGERY  1977[birth year]  . RIGHT HEART CATHETERIZATION N/A 03/07/2012   Procedure: RIGHT HEART CATH;  Surgeon: Burnell Blanks, MD;  [facility]: [facility] C[facility];  Service: Cardiovascular;  Laterality: N/A;    OB History    No data available       Home Medications    Prior to Admission medications   Medication Sig Start Date End Date Taking? Authorizing Provider  albuterol (PROVENTIL HFA;VENTOLIN HFA) 108 (90 BASE) MCG/ACT inhaler Inhale 2 puffs into the lungs every 6 (six) hours as needed for wheezing or shortness of breath. 09/06/14  Yes Brand Males, MD  BREO ELLIPTA 100-25 MCG/INH AEPB INHALE 1 PUFF INTO THE LUNGS DAILY. 06/03/16  Yes Brand Males, MD    doxycycline (VIBRA-TABS) 100 MG tablet Take 1 tablet (100 mg total) by mouth every 12 (twelve) hours. 07/22/16  Yes Kelvin Cellar, MD  guaiFENesin (MUCINEX) 600 MG 12 hr tablet Take 1 tablet (600 mg total) by mouth 2 (two) times daily. 07/22/16  Yes Kelvin Cellar, MD  pantoprazole (PROTONIX) 40 MG tablet Take 1 tablet (40 mg total) by mouth 2 (two) times daily before a meal. 07/22/16  Yes Kelvin Cellar, MD  potassium chloride SA (K-DUR,KLOR-CON) 20 MEQ tablet Take 1 tablet (20 mEq total) by mouth daily. 01/29/16  Yes Jolaine Artist, MD  predniSONE (STERAPRED UNI-PAK 21 TAB) 10 MG (21) TBPK tablet Take 40 mg PO q daily x 3 days followed by 30 mg PO q daily for 3 days, followed by 20 mg PO q daily for 3 days, followed by 10 mg PO q daily for 3 days then stop. Qty sufficient 07/22/16  Yes Kelvin Cellar, MD  pregabalin (LYRICA) 50 MG capsule Take 1 capsule (50 mg total) by mouth 3 (three) times daily. 05/31/16  Yes Golden Circle, FNP  roflumilast (  DALIRESP) 500 MCG TABS tablet Take 500 mcg by mouth daily.   Yes Historical Provider, MD  Tiotropium Bromide Monohydrate (SPIRIVA RESPIMAT) 1.25 MCG/ACT AERS Inhale 2 puffs into the lungs 2 (two) times daily. Patient taking differently: Inhale 2 puffs into the lungs daily.  02/02/16  Yes Brand Males, MD  torsemide (DEMADEX) 20 MG tablet Take 1 tablet (20 mg total) by mouth daily. 07/29/16  Yes Golden Circle, FNP  traMADol (ULTRAM) 50 MG tablet Take 1 tablet (50 mg total) by mouth every 6 (six) hours as needed for severe pain. 07/13/16  Yes Golden Circle, FNP  blood glucose meter kit and supplies KIT Dispense based on patient and insurance preference. Use up to four times daily as directed. (FOR ICD-9 250.00, 250.01). 04/11/16   Janece Canterbury, MD    Family History Family History  Problem Relation Age of Onset  . Hypertension Father   . Heart disease Father     CHF; died age 71   . Alcohol abuse Father   . Other Brother     died age 5 y.o  overdose    Social History Social History  Substance Use Topics  . Smoking status: Former Smoker    Packs/day: 1.00    Years: 25.00    Types: Cigarettes    Quit date: 02/18/2012  . Smokeless tobacco: Never Used  . Alcohol use 1.8 oz/week    3 Shots of liquor per week     Comment: previously drinking 1 pint 3-4 days a week for 2002-2013, but in 2014, occasional drinking now      Allergies   Review of patient's allergies indicates no known allergies.   Review of Systems Review of Systems All other systems negative except as documented in the HPI. All pertinent positives and negatives as reviewed in the HPI.  Physical Exam Updated Vital Signs BP 117/79   Pulse 105   Temp 98.2 F (36.8 C) (Oral)   Resp 22   Ht 5' 2" (1.575 m)   Wt 89.8 kg   SpO2 90%   BMI 36.21 kg/m   Physical Exam  Constitutional: She is oriented to person, place, and time. She appears well-developed and well-nourished. No distress.  HENT:  Head: Normocephalic and atraumatic.  Mouth/Throat: Oropharynx is clear and moist.  Eyes: Pupils are equal, round, and reactive to light.  Neck: Normal range of motion. Neck supple.  Cardiovascular: Normal rate, regular rhythm and normal heart sounds.  Exam reveals no gallop and no friction rub.   No murmur heard. Pulmonary/Chest: Effort normal. No respiratory distress. She has wheezes. She has no rales.  Abdominal: Soft. Bowel sounds are normal. She exhibits no distension. There is no tenderness.  Musculoskeletal: She exhibits no edema.  Neurological: She is alert and oriented to person, place, and time. She exhibits normal muscle tone. Coordination normal.  Skin: Skin is warm and dry. No rash noted. No erythema.  Psychiatric: She has a normal mood and affect. Her behavior is normal.  Nursing note and vitals reviewed.    ED Treatments / Results  Labs (all labs ordered are listed, but only abnormal results are displayed) Labs Reviewed  CBC WITH  DIFFERENTIAL/PLATELET - Abnormal; Notable for the following:       Result Value   WBC 10.7 (*)    Hemoglobin 15.5 (*)    HCT 48.0 (*)    MCV 101.5 (*)    Platelets 127 (*)    Neutro Abs 8.1 (*)  All other components within normal limits  BASIC METABOLIC PANEL - Abnormal; Notable for the following:    Sodium 134 (*)    Potassium 3.3 (*)    Chloride 85 (*)    CO2 37 (*)    Glucose, Bld 131 (*)    BUN <5 (*)    All other components within normal limits    EKG  EKG Interpretation None       Radiology Dg Chest Port 1 View  Result Date: 08/09/2016 CLINICAL DATA:  COPD exacerbation EXAM: PORTABLE CHEST 1 VIEW COMPARISON:  07/18/2016 FINDINGS: Lungs are clear.  No pleural effusion or pneumothorax. The heart is top-normal in size. IMPRESSION: No evidence of acute cardiopulmonary disease. Electronically Signed   By: Sriyesh  Krishnan M.D.   On: 08/09/2016 09:42    Procedures Procedures (including critical care time)  Medications Ordered in ED Medications  albuterol (PROVENTIL,VENTOLIN) solution continuous neb (0 mg/hr Nebulization Stopped 08/09/16 1024)  ipratropium (ATROVENT) nebulizer solution 0.5 mg (0.5 mg Nebulization Given 08/09/16 0948)  methylPREDNISolone sodium succinate (SOLU-MEDROL) 125 mg/2 mL injection 125 mg (125 mg Intravenous Given 08/09/16 1306)  ondansetron (ZOFRAN) injection 4 mg (4 mg Intravenous Given 08/09/16 1349)     Initial Impression / Assessment and Plan / ED Course  I have reviewed the triage vital signs and the nursing notes.  Pertinent labs & imaging results that were available during my care of the patient were reviewed by me and considered in my medical decision making (see chart for details).  Clinical Course    Patient has not improved following the hour-long neb treatment and a Medrol the patient actually feels worse.  Her pulse oximetry is actually dropped into the mid 80s.  Patient most likely need to be admitted for COPD exacerbation.  The  patient is advised the plan and all questions were answered  Final Clinical Impressions(s) / ED Diagnoses   Final diagnoses:  None    New Prescriptions New Prescriptions   No medications on file     Christopher Lawyer, PA-C 08/09/16 1413    Anthony Allen, MD 08/10/16 1712  

## 2016-08-09 NOTE — ED Triage Notes (Signed)
Pt. Here for possible COPD exacerbation. Pt. Hx of COPD, CHF, pulmonary HTN, and asthma. Pt. Reports having to wear more oxygen than usual to keep her saturations up. Pt. Reports severe dyspnea with exertion with oxygen saturation dropping down to upper 70's. Pt. Also reports at night time she can't keep her oxygen saturation up with CPAP and sitting up in bed. Pt. Denies smoking or second hand smoke lately. Pt. Says she may have some edema to her face. EDP at bedside.

## 2016-08-09 NOTE — Progress Notes (Signed)
Pt wears cpap at night. Auto cpap at bedside for pt use. RT will continue to monitor.

## 2016-08-09 NOTE — Progress Notes (Signed)
Patient states that she will place BiPAP on when ready. RT educated patient on connecting oxygen to BiPAP and she demonstrated full understanding because this is what she does at home. RT encouraged patient to call if she had ANY trouble at all and educated RN on oxygen connection, who also demonstrated understanding. RT will continue to monitor.

## 2016-08-09 NOTE — H&P (Signed)
History and Physical    Mary [name] HRC:[id number] DOB: Feb 12, 1976 DOA: 08/09/2016  PCP: Mauricio Po, FNP Patient coming from: home  Chief Complaint: sob/hypoxemia  HPI: Mary [name] is a 40 y.o. female with medical history significant of CHF, DVT/PE, hepatic cirrhosis, oxygen-dependent COPD/asthma patients with obstructive sleep apnea on BiPAP who presents with a COPD exacerbation. History sleep admitted for the same. Patient felt like her symptoms have completely resolved after last admission and she was at her baseline until approximately 2-3 days ago. Started to develop cough and a funny feeling in her chest. Approximately 1 day ago patient noted that she required an increase in her O2 at home. She reports that she antibiotics couple of days ago as well as her steroids a couple of days. Home O2 sats noted to drop into the upper 80s over the last day. Unable to keep her oxygen saturations up at night while on CPAP. Home nebulizer treatments without much improvement. Patient oxygenation is around 93% at home. Denies chest pain, neck stiffness, fevers, lower extremity edema, nausea, vomiting, palpitations.  ED Course: CAT with mild improvement in symptoms. Resistive hypoxia. Objective findings outlined below.  Review of Systems: As per HPI otherwise 10 point review of systems negative.   Ambulatory Status: Restricted by poor pulmonary function.  Past Medical History:  Diagnosis Date  . Axillary adenopathy    right axillary adenopathy noted on CT chest (03/06/2012)  . Chronic right-sided CHF (congestive heart failure) ([place]) 03/23/2012   with cor pulmonale. Last RHC 03/2012  . COPD (chronic obstructive pulmonary disease) ([place])    PFTS 03/08/12: fev1 0.58L/1%, FVC 1.18/33%, Ratop 49 and c/w ssevere obstruction. 21% BD response on FVC, RV 219%, DLCO 11/54%  . DVT of upper extremity (deep vein thrombosis) [facility]) April 2013   right subclavian // Unclear precipitating cause - possibly  significant right heart failure, with vascular stasis potentially predisposing to clotting.    . Exertional shortness of breath   . Hepatic cirrhosis ([place])    Questionable history of - Noted on CT abdomen (03/2012) - thought to be due to vascular congestion from right heart failure +/- patient's history of alcohol abuse  . History of alcoholism ([place])   . History of chronic bronchitis   . Irregular menses   . Migraine    "~ 1/yr" (05/03/2013)  . Moderate to severe pulmonary hypertension [facility]) April 2013   Cardiac cath on 03/06/12 - 1. Elevated pulmonary artery pressures, right sided filling pressures.,  2. PA: 64/45 (mean 53)    . On home oxygen therapy    "2-3 L 24/7" (05/03/2013)  . OSA (obstructive sleep apnea)    wears noctural BiPAP (05/03/2013)  . Periodontal disease   . Pneumonia ~ 1985; 01/2011  . Pulmonary embolus [facility]) April 2013   Precipitating cause unclear. Was treated with coumadin from April-June 2013.    Past Surgical History:  Procedure Laterality Date  . APPENDECTOMY  05/03/2013  . CARDIAC CATHETERIZATION  03/2012  . CARDIAC CATHETERIZATION N/A 01/08/2016   Procedure: Right Heart Cath;  Surgeon: Jolaine Artist, MD;  Location: [place] CV LAB;  Service: Cardiovascular;  Laterality: N/A;  . CARDIAC SURGERY  Jul 12, 1977   "my heart was backwards" (05/03/2013)  . LAPAROSCOPIC APPENDECTOMY N/A 05/03/2013   Procedure: APPENDECTOMY LAPAROSCOPIC;  Surgeon: Gwenyth Ober, MD;  Location: [place];  Service: General;  Laterality: N/A;  . LUNG SURGERY  Dec 30, 1976  . RIGHT HEART CATHETERIZATION N/A 03/07/2012   Procedure: RIGHT HEART  CATH;  Surgeon: Burnell Blanks, MD;  Location: [facility] CATH LAB;  Service: Cardiovascular;  Laterality: N/A;    Social History   Social History  . Marital status: Married    Spouse name: N/A  . Number of children: 0  . Years of education: 14   Occupational History  . Unemployed [employer]   Social History Main Topics    . Smoking status: Former Smoker    Packs/day: 1.00    Years: 25.00    Types: Cigarettes    Quit date: 02/18/2012  . Smokeless tobacco: Never Used  . Alcohol use 1.8 oz/week    3 Shots of liquor per week     Comment: previously drinking 1 pint 3-4 days a week for 2002-2013, but in 2014, occasional drinking now   . Drug use: No  . Sexual activity: Yes    Partners: Male   Other Topics Concern  . Not on file   Social History Narrative   Lives with husband in [place]   Has a farm with multiple animals including chickens, [place] pigs, cockatil (bird), dogs and cats   Completed GED and 2 years of college   Smoking-quit smoking >1 year since 2014   Not working    Fun: Health visitor with her dog and go fishing    No Known Allergies  Family History  Problem Relation Age of Onset  . Hypertension Father   . Heart disease Father     CHF; died age 73   . Alcohol abuse Father   . Other Brother     died age 68 y.o overdose    Prior to Admission medications   Medication Sig Start Date End Date Taking? Authorizing Provider  albuterol (PROVENTIL HFA;VENTOLIN HFA) 108 (90 BASE) MCG/ACT inhaler Inhale 2 puffs into the lungs every 6 (six) hours as needed for wheezing or shortness of breath. 09/06/14  Yes Brand Males, MD  BREO ELLIPTA 100-25 MCG/INH AEPB INHALE 1 PUFF INTO THE LUNGS DAILY. 06/03/16  Yes Brand Males, MD  doxycycline (VIBRA-TABS) 100 MG tablet Take 1 tablet (100 mg total) by mouth every 12 (twelve) hours. 07/22/16  Yes Kelvin Cellar, MD  guaiFENesin (MUCINEX) 600 MG 12 hr tablet Take 1 tablet (600 mg total) by mouth 2 (two) times daily. 07/22/16  Yes Kelvin Cellar, MD  pantoprazole (PROTONIX) 40 MG tablet Take 1 tablet (40 mg total) by mouth 2 (two) times daily before a meal. 07/22/16  Yes Kelvin Cellar, MD  potassium chloride SA (K-DUR,KLOR-CON) 20 MEQ tablet Take 1 tablet (20 mEq total) by mouth daily. 01/29/16  Yes Jolaine Artist, MD  predniSONE (STERAPRED  UNI-PAK 21 TAB) 10 MG (21) TBPK tablet Take 40 mg PO q daily x 3 days followed by 30 mg PO q daily for 3 days, followed by 20 mg PO q daily for 3 days, followed by 10 mg PO q daily for 3 days then stop. Qty sufficient 07/22/16  Yes Kelvin Cellar, MD  pregabalin (LYRICA) 50 MG capsule Take 1 capsule (50 mg total) by mouth 3 (three) times daily. 05/31/16  Yes Golden Circle, FNP  roflumilast (DALIRESP) 500 MCG TABS tablet Take 500 mcg by mouth daily.   Yes Historical Provider, MD  Tiotropium Bromide Monohydrate (SPIRIVA RESPIMAT) 1.25 MCG/ACT AERS Inhale 2 puffs into the lungs 2 (two) times daily. Patient taking differently: Inhale 2 puffs into the lungs daily.  02/02/16  Yes Brand Males, MD  torsemide (DEMADEX) 20 MG  tablet Take 1 tablet (20 mg total) by mouth daily. 07/29/16  Yes Golden Circle, FNP  traMADol (ULTRAM) 50 MG tablet Take 1 tablet (50 mg total) by mouth every 6 (six) hours as needed for severe pain. 07/13/16  Yes Golden Circle, FNP  blood glucose meter kit and supplies KIT Dispense based on patient and insurance preference. Use up to four times daily as directed. (FOR ICD-9 250.00, 250.01). 04/11/16   Janece Canterbury, MD    Physical Exam: Vitals:   08/09/16 1458 08/09/16 1500 08/09/16 1530 08/09/16 1612  BP: 112/85 124/89 113/66 105/66  Pulse: 104 104 103 (!) 50  Resp: 22 18 19    Temp:    98.2 F (36.8 C)  TempSrc:    Oral  SpO2: 90% 91% 90% (!) 89%  Weight:      Height:         General:  Appears calm and comfortable Eyes:   L eye w/ lateral fixation, normal lids, iris ENT:  grossly normal hearing, lips & tongue, mmm Neck:  no LAD, masses or thyromegaly Cardiovascular:  RRR, no m/r/g. No LE edema.  Respiratory: Diffuse wheezing in all lung fields, decreased air movement, increased effort, on 4 L nasal cannula Abdomen:  soft, ntnd, NABS Skin:  no rash or induration seen on limited exam Musculoskeletal:  grossly normal tone BUE/BLE, good ROM, no bony  abnormality Psychiatric:  grossly normal mood and affect, speech fluent and appropriate, AOx3 Neurologic:  CN 2-12 grossly intact, moves all extremities in coordinated fashion, sensation intact  Labs on Admission: I have personally reviewed following labs and imaging studies  CBC:  Recent Labs Lab 08/09/16 0918  WBC 10.7*  NEUTROABS 8.1*  HGB 15.5*  HCT 48.0*  MCV 101.5*  PLT 147*   Basic Metabolic Panel:  Recent Labs Lab 08/09/16 0918  NA 134*  K 3.3*  CL 85*  CO2 37*  GLUCOSE 131*  BUN <5*  CREATININE 0.54  CALCIUM 9.2   GFR: Estimated Creatinine Clearance: 99.3 mL/min (by C-G formula based on SCr of 0.8 mg/dL). Liver Function Tests: No results for input(s): AST, ALT, ALKPHOS, BILITOT, PROT, ALBUMIN in the last 168 hours. No results for input(s): LIPASE, AMYLASE in the last 168 hours. No results for input(s): AMMONIA in the last 168 hours. Coagulation Profile: No results for input(s): INR, PROTIME in the last 168 hours. Cardiac Enzymes: No results for input(s): CKTOTAL, CKMB, CKMBINDEX, TROPONINI in the last 168 hours. BNP (last 3 results)  Recent Labs  04/20/16 1703  PROBNP 30.0   HbA1C: No results for input(s): HGBA1C in the last 72 hours. CBG: No results for input(s): GLUCAP in the last 168 hours. Lipid Profile: No results for input(s): CHOL, HDL, LDLCALC, TRIG, CHOLHDL, LDLDIRECT in the last 72 hours. Thyroid Function Tests: No results for input(s): TSH, T4TOTAL, FREET4, T3FREE, THYROIDAB in the last 72 hours. Anemia Panel: No results for input(s): VITAMINB12, FOLATE, FERRITIN, TIBC, IRON, RETICCTPCT in the last 72 hours. Urine analysis:    Component Value Date/Time   COLORURINE YELLOW 07/25/2015 1400   APPEARANCEUR HAZY (A) 07/25/2015 1400   LABSPEC 1.020 07/25/2015 1400   PHURINE 7.0 07/25/2015 1400   GLUCOSEU NEGATIVE 07/25/2015 1400   HGBUR LARGE (A) 07/25/2015 1400   BILIRUBINUR NEGATIVE 07/25/2015 1400   KETONESUR NEGATIVE 07/25/2015  1400   PROTEINUR NEGATIVE 07/25/2015 1400   UROBILINOGEN 1.0 07/25/2015 1400   NITRITE NEGATIVE 07/25/2015 1400   LEUKOCYTESUR NEGATIVE 07/25/2015 1400    Creatinine Clearance: Estimated  Creatinine Clearance: 99.3 mL/min (by C-G formula based on SCr of 0.8 mg/dL).  Sepsis Labs: @LABRCNTIP (procalcitonin:4,lacticidven:4) )No results found for this or any previous visit (from the past 240 hour(s)).   Radiological Exams on Admission: Dg Chest Port 1 View  Result Date: 08/09/2016 CLINICAL DATA:  COPD exacerbation EXAM: PORTABLE CHEST 1 VIEW COMPARISON:  07/18/2016 FINDINGS: Lungs are clear.  No pleural effusion or pneumothorax. The heart is top-normal in size. IMPRESSION: No evidence of acute cardiopulmonary disease. Electronically Signed   By: Julian Hy M.D.   On: 08/09/2016 09:42      Assessment/Plan Active Problems:   OSA on BiPAP   Hypokalemia   Acute on chronic respiratory failure with hypoxia (HCC)   Chronic diastolic heart failure (HCC)   COPD exacerbation (HCC)   Diabetes mellitus type 2 in obese (HCC)   Diabetic neuropathy (HCC)   Acute on chronic respiratory failure: Patient with 2 L baseline O2 requirement and uses BiPAP at night. Patient recently admitted for the same problem. ABG showing pH 7.38, PCO2 75.6, PaO2 54, on 5 L is a cannula. Patient's symptoms seem to have deteriorated after stopping antibiotics and steroids. Followed in the outpatient by pulmonology.  - Med surge - SOlumedrol 60 Q6, Duonebs Q4, Levaquin, Mucinex - Wean O2 as able - Pulmonology consult for optimization - BIPAP QHS - Continue Daliresp,Breo, spriva  Chronic diastolic congestive heart failure: No evidence of acute exacerbation. Last Echo showing EF 89% grade 1 diastolic dysfunction. - Daily weights, strict I's and O's - continue torsemide,   GERD; - continue ppi  Chronic pain: - continue tramadol, lyrica  Diabetes: Anticipate hyperglycemia given steroid dosing - SSI  DVT  prophylaxis: Lovenox  Code Status: full  Family Communication: none  Disposition Plan: pending improvement  Consults called: pulmonlogy  Admission status: observation    MERRELL, DAVID J MD [facility]  If 7PM-7AM, please contact night-coverage [email or web address] Password [place]  08/09/2016, 6:49 PM

## 2016-08-09 NOTE — Progress Notes (Signed)
ANTIBIOTIC CONSULT NOTE - INITIAL  Pharmacy Consult for Levaquin Indication: COPD  No Known Allergies  Patient Measurements: Height: 5\' 2"  (157.5 cm) Weight: 198 lb (89.8 kg) IBW/kg (Calculated) : 50.1 Adjusted Body Weight:    Vital Signs: Temp: 98.2 F (36.8 C) (09/04 1612) Temp Source: Oral (09/04 1612) BP: 105/66 (09/04 1612) Pulse Rate: 50 (09/04 1612) Intake/Output from previous day: No intake/output data recorded. Intake/Output from this shift: No intake/output data recorded.  Labs:  Recent Labs  08/09/16 0918  WBC 10.7*  HGB 15.5*  PLT 127*  CREATININE 0.54   Estimated Creatinine Clearance: 99.3 mL/min (by C-G formula based on SCr of 0.8 mg/dL). No results for input(s): VANCOTROUGH, VANCOPEAK, VANCORANDOM, GENTTROUGH, GENTPEAK, GENTRANDOM, TOBRATROUGH, TOBRAPEAK, TOBRARND, AMIKACINPEAK, AMIKACINTROU, AMIKACIN in the last 72 hours.   Microbiology:   Medical History: Past Medical History:  Diagnosis Date  . Axillary adenopathy    right axillary adenopathy noted on CT chest (03/06/2012)  . Chronic right-sided CHF (congestive heart failure) (HCC) 03/23/2012   with cor pulmonale. Last RHC 03/2012  . COPD (chronic obstructive pulmonary disease) (HCC)    PFTS 03/08/12: fev1 0.58L/1%, FVC 1.18/33%, Ratop 49 and c/w ssevere obstruction. 21% BD response on FVC, RV 219%, DLCO 11/54%  . DVT of upper extremity (deep vein thrombosis) [facility](HCC) April 2013   right subclavian // Unclear precipitating cause - possibly significant right heart failure, with vascular stasis potentially predisposing to clotting.    . Exertional shortness of breath   . Hepatic cirrhosis (HCC)    Questionable history of - Noted on CT abdomen (03/2012) - thought to be due to vascular congestion from right heart failure +/- patient's history of alcohol abuse  . History of alcoholism (HCC)   . History of chronic bronchitis   . Irregular menses   . Migraine    "~ 1/yr" (05/03/2013)  . Moderate to severe  pulmonary hypertension [facility](HCC) April 2013   Cardiac cath on 03/06/12 - 1. Elevated pulmonary artery pressures, right sided filling pressures.,  2. PA: 64/45 (mean 53)    . On home oxygen therapy    "2-3 L 24/7" (05/03/2013)  . OSA (obstructive sleep apnea)    wears noctural BiPAP (05/03/2013)  . Periodontal disease   . Pneumonia ~ 1985; 01/2011  . Pulmonary embolus [facility](HCC) April 2013   Precipitating cause unclear. Was treated with coumadin from April-June 2013.    Assessment: 40 y/o F with extensive PMH presents with SOB/hypoxemia=COPD exac.Afebrile. WBC 10.7. Scr 0.54 with CrCl 99  Goal of Therapy:  Eradication of infection  Plan:  Levaquin 750mg  IV q24h ok for CrCl>50 Pharmacy will sign off. Please reconsult for further dosing assitance.  Manish Ruggiero S. Merilynn Finlandobertson, PharmD, BCPS Clinical Staff Pharmacist Pager [phone]  Misty Stanleyobertson, Chrisanna Mishra Stillinger 08/09/2016,6:53 PM

## 2016-08-09 NOTE — ED Notes (Signed)
Respiratory therapist at bedside.

## 2016-08-10 LAB — GLUCOSE, RANDOM: Glucose, Bld: 423 mg/dL — ABNORMAL HIGH (ref 65–99)

## 2016-08-10 LAB — COMPREHENSIVE METABOLIC PANEL
ALT: 45 U/L (ref 14–54)
ANION GAP: 11 (ref 5–15)
AST: 30 U/L (ref 15–41)
Albumin: 3.7 g/dL (ref 3.5–5.0)
Alkaline Phosphatase: 108 U/L (ref 38–126)
BUN: 8 mg/dL (ref 6–20)
CHLORIDE: 86 mmol/L — AB (ref 101–111)
CO2: 38 mmol/L — ABNORMAL HIGH (ref 22–32)
Calcium: 9.8 mg/dL (ref 8.9–10.3)
Creatinine, Ser: 0.64 mg/dL (ref 0.44–1.00)
Glucose, Bld: 280 mg/dL — ABNORMAL HIGH (ref 65–99)
POTASSIUM: 3.9 mmol/L (ref 3.5–5.1)
Sodium: 135 mmol/L (ref 135–145)
Total Bilirubin: 0.8 mg/dL (ref 0.3–1.2)
Total Protein: 7.3 g/dL (ref 6.5–8.1)

## 2016-08-10 LAB — CBC
HCT: 46.2 % — ABNORMAL HIGH (ref 36.0–46.0)
HEMOGLOBIN: 14.9 g/dL (ref 12.0–15.0)
MCH: 32.4 pg (ref 26.0–34.0)
MCHC: 32.3 g/dL (ref 30.0–36.0)
MCV: 100.4 fL — ABNORMAL HIGH (ref 78.0–100.0)
Platelets: 136 10*3/uL — ABNORMAL LOW (ref 150–400)
RBC: 4.6 MIL/uL (ref 3.87–5.11)
RDW: 13.2 % (ref 11.5–15.5)
WBC: 12.7 10*3/uL — ABNORMAL HIGH (ref 4.0–10.5)

## 2016-08-10 LAB — GLUCOSE, CAPILLARY
GLUCOSE-CAPILLARY: 282 mg/dL — AB (ref 65–99)
GLUCOSE-CAPILLARY: 378 mg/dL — AB (ref 65–99)
GLUCOSE-CAPILLARY: 478 mg/dL — AB (ref 65–99)
Glucose-Capillary: 354 mg/dL — ABNORMAL HIGH (ref 65–99)

## 2016-08-10 MED ORDER — INSULIN ASPART 100 UNIT/ML ~~LOC~~ SOLN
25.0000 [IU] | Freq: Once | SUBCUTANEOUS | Status: AC
  Administered 2016-08-10: 25 [IU] via SUBCUTANEOUS

## 2016-08-10 MED ORDER — ZOLPIDEM TARTRATE 5 MG PO TABS
5.0000 mg | ORAL_TABLET | Freq: Once | ORAL | Status: AC
  Administered 2016-08-10: 5 mg via ORAL
  Filled 2016-08-10: qty 1

## 2016-08-10 MED ORDER — INSULIN ASPART 100 UNIT/ML ~~LOC~~ SOLN
0.0000 [IU] | Freq: Every day | SUBCUTANEOUS | Status: DC
  Administered 2016-08-10 – 2016-08-11 (×2): 5 [IU] via SUBCUTANEOUS
  Administered 2016-08-12: 3 [IU] via SUBCUTANEOUS

## 2016-08-10 MED ORDER — INSULIN ASPART 100 UNIT/ML ~~LOC~~ SOLN
0.0000 [IU] | Freq: Three times a day (TID) | SUBCUTANEOUS | Status: DC
  Administered 2016-08-11: 20 [IU] via SUBCUTANEOUS
  Administered 2016-08-11: 15 [IU] via SUBCUTANEOUS
  Administered 2016-08-11 (×2): 20 [IU] via SUBCUTANEOUS
  Administered 2016-08-12: 11 [IU] via SUBCUTANEOUS
  Administered 2016-08-12: 15 [IU] via SUBCUTANEOUS
  Administered 2016-08-12: 20 [IU] via SUBCUTANEOUS
  Administered 2016-08-13: 11 [IU] via SUBCUTANEOUS
  Administered 2016-08-13: 15 [IU] via SUBCUTANEOUS
  Administered 2016-08-13: 7 [IU] via SUBCUTANEOUS

## 2016-08-10 MED ORDER — SODIUM CHLORIDE 0.9% FLUSH
3.0000 mL | Freq: Two times a day (BID) | INTRAVENOUS | Status: DC
  Administered 2016-08-10 – 2016-08-13 (×7): 3 mL via INTRAVENOUS

## 2016-08-10 MED ORDER — ALPRAZOLAM 0.5 MG PO TABS
0.5000 mg | ORAL_TABLET | Freq: Once | ORAL | Status: AC | PRN
  Administered 2016-08-10: 0.5 mg via ORAL
  Filled 2016-08-10: qty 1

## 2016-08-10 MED ORDER — IPRATROPIUM-ALBUTEROL 0.5-2.5 (3) MG/3ML IN SOLN
3.0000 mL | Freq: Three times a day (TID) | RESPIRATORY_TRACT | Status: DC
  Administered 2016-08-10 – 2016-08-12 (×4): 3 mL via RESPIRATORY_TRACT
  Filled 2016-08-10 (×4): qty 3

## 2016-08-10 MED ORDER — INSULIN GLARGINE 100 UNIT/ML ~~LOC~~ SOLN
15.0000 [IU] | Freq: Every day | SUBCUTANEOUS | Status: DC
  Administered 2016-08-10 – 2016-08-11 (×2): 15 [IU] via SUBCUTANEOUS
  Filled 2016-08-10 (×2): qty 0.15

## 2016-08-10 MED ORDER — SODIUM CHLORIDE 0.9% FLUSH: 3.0000 mL | INTRAVENOUS | Status: DC | PRN

## 2016-08-10 NOTE — Progress Notes (Signed)
PROGRESS NOTE    Mary [name]  ZOX:[id number] DOB: 06/24/1976 DOA: 08/09/2016 PCP: Mary Luz, FNP   Brief Narrative:  Mary [name] is a 40 year old female with a past medical history of chronic obstructive pulmonary disease, obstructive sleep apnea, recently admitted from 07/18/2016 through 07/22/2016 where she was treated for a COPD exacerbation, saw her pulmonologist Dr Mary [name] on 07/27/2016. No changes were made to her medication regimen. She presented to the emergency room on 08/09/2016 with complaints of worsening shortness of breath associated with cough and wheezing. Felt symptoms secondary to COPD exacerbation as she was started on systemic steroids with Solu-Medrol 60 mg IV every 6 hours along with scheduled duo nebs. Chest x-ray did not reveal acute cardiopulmonary disease.  Assessment & Plan:   Active Problems:   OSA on BiPAP   Hypokalemia   Acute on chronic respiratory failure with hypoxia (HCC)   Chronic diastolic heart failure (HCC)   COPD exacerbation (HCC)   Diabetes mellitus type 2 in obese (HCC)   Diabetic neuropathy (HCC)  1.  Chronic obstructive pulmonary disease exacerbation -She has a history of COPD, recently admitted for COPD exacerbation from 07/18/2016 through 07/22/2016. -Presented with worsening shortness of breath, cough, wheezing. Chest x-ray did not reveal acute cardiopulmonary disease. -Continue SoluMedrol 60 mg IV every 6 hours, scheduled duo nebs every 4 hours, and inhaled steroid -She recently completed course of antibiotic therapy, will monitor off of antimicrobials.  2.  Acute on chronic hypoxemic respiratory failure -Evidence by an O2 sat of 80% on 4 L of supplemental oxygen -Likely caused by COPD exacerbation -Continue systemic steroids and nebulizer treatments. A chest x-ray did not reveal evidence of CHF or pneumonia. -Will repeat 2-view CXR in AM to ensure stability  3.  History of diastolic congestive heart failure -Last  transthoracic echocardiogram performed on 01/06/2016 that showed grade 1 diastolic dysfunction with preserved ejection fraction -Chest x-ray did not reveal acute CHF -Monitor ins and outs -Continue torsemide 20 mg by mouth daily.  4.  Type II DM/Hyperglycemia. -Having elevated blood sugars likely related to administration of systemic steroids -Lantus 15 units  daily -Continue sliding scale coverage  5.  Gastroesophageal reflux disease -On Protonix 40 mg by mouth daily   DVT prophylaxis: Lovenox Code Status: Full code Family Communication:  Disposition Plan:    Subjective: Admitted overnight, reports ongoing shortness of breath cough and wheezing. States feeling poorly  Objective: Vitals:   08/10/16 0500 08/10/16 0539 08/10/16 0845 08/10/16 1144  BP:  100/65    Pulse:  77    Resp:  18    Temp:  97.7 F (36.5 C)    TempSrc:  Oral    SpO2:  90% (!) 88% 93%  Weight: 83.2 kg (183 lb 8 oz)     Height:        Intake/Output Summary (Last 24 hours) at 08/10/16 1300 Last data filed at 08/10/16 0900  Gross per 24 hour  Intake              690 ml  Output                0 ml  Net              690 ml   Filed Weights   08/09/16 0903 08/10/16 0500  Weight: 89.8 kg (198 lb) 83.2 kg (183 lb 8 oz)    Examination:  General exam: Chronically ill-appearing, having some dyspnea at rest Respiratory system: Diminished breath sounds bilaterally with  expiratory wheezes, rhonchi, on 5 L supplemental oxygen via nasal cannula Cardiovascular system: S1 & S2 heard, RRR. No JVD, murmurs, rubs, gallops or clicks. No pedal edema. Gastrointestinal system: Abdomen is nondistended, soft and nontender. No organomegaly or masses felt. Normal bowel sounds heard. Central nervous system: Alert and oriented. No focal neurological deficits. Extremities: Symmetric 5 x 5 power. Skin: No rashes, lesions or ulcers Psychiatry: Judgement and insight appear normal. Mood & affect appropriate.     Data  Reviewed: I have personally reviewed following labs and imaging studies  CBC:  Recent Labs Lab 08/09/16 0918 08/10/16 0719  WBC 10.7* 12.7*  NEUTROABS 8.1*  --   HGB 15.5* 14.9  HCT 48.0* 46.2*  MCV 101.5* 100.4*  PLT 127* 136*   Basic Metabolic Panel:  Recent Labs Lab 08/09/16 0918 08/10/16 0719  NA 134* 135  K 3.3* 3.9  CL 85* 86*  CO2 37* 38*  GLUCOSE 131* 280*  BUN <5* 8  CREATININE 0.54 0.64  CALCIUM 9.2 9.8   GFR: Estimated Creatinine Clearance: 95.3 mL/min (by C-G formula based on SCr of 0.8 mg/dL). Liver Function Tests:  Recent Labs Lab 08/10/16 0719  AST 30  ALT 45  ALKPHOS 108  BILITOT 0.8  PROT 7.3  ALBUMIN 3.7   No results for input(s): LIPASE, AMYLASE in the last 168 hours. No results for input(s): AMMONIA in the last 168 hours. Coagulation Profile: No results for input(s): INR, PROTIME in the last 168 hours. Cardiac Enzymes: No results for input(s): CKTOTAL, CKMB, CKMBINDEX, TROPONINI in the last 168 hours. BNP (last 3 results)  Recent Labs  04/20/16 1703  PROBNP 30.0   HbA1C: No results for input(s): HGBA1C in the last 72 hours. CBG:  Recent Labs Lab 08/09/16 2122 08/10/16 0737 08/10/16 1214  GLUCAP 309* 282* 378*   Lipid Profile: No results for input(s): CHOL, HDL, LDLCALC, TRIG, CHOLHDL, LDLDIRECT in the last 72 hours. Thyroid Function Tests: No results for input(s): TSH, T4TOTAL, FREET4, T3FREE, THYROIDAB in the last 72 hours. Anemia Panel: No results for input(s): VITAMINB12, FOLATE, FERRITIN, TIBC, IRON, RETICCTPCT in the last 72 hours. Sepsis Labs: No results for input(s): PROCALCITON, LATICACIDVEN in the last 168 hours.  No results found for this or any previous visit (from the past 240 hour(s)).       Radiology Studies: Dg Chest Port 1 View  Result Date: 08/09/2016 CLINICAL DATA:  COPD exacerbation EXAM: PORTABLE CHEST 1 VIEW COMPARISON:  07/18/2016 FINDINGS: Lungs are clear.  No pleural effusion or  pneumothorax. The heart is top-normal in size. IMPRESSION: No evidence of acute cardiopulmonary disease. Electronically Signed   By: Mary BillsSriyesh  [name] M.D.   On: 08/09/2016 09:42        Scheduled Meds: . enoxaparin (LOVENOX) injection  40 mg Subcutaneous Q24H  . fluticasone furoate-vilanterol  1 puff Inhalation Daily  . guaiFENesin  1,200 mg Oral BID  . insulin aspart  0-15 Units Subcutaneous TID WC  . insulin aspart  0-5 Units Subcutaneous QHS  . ipratropium-albuterol  3 mL Nebulization TID  . methylPREDNISolone (SOLU-MEDROL) injection  60 mg Intravenous Q6H  . pantoprazole  40 mg Oral BID AC  . potassium chloride SA  40 mEq Oral Daily  . pregabalin  50 mg Oral TID  . roflumilast  500 mcg Oral Daily  . sodium chloride flush  3 mL Intravenous Q12H  . torsemide  20 mg Oral Daily   Continuous Infusions:    LOS: 1 day    Time spent:  35 min    Jeralyn Bennett, MD [facility] Pager [phone]  If 7PM-7AM, please contact night-coverage [email or web address] Password [email or web address] 08/10/2016, 1:00 PM

## 2016-08-10 NOTE — Progress Notes (Signed)
Inpatient Diabetes Program Recommendations  AACE/ADA: New Consensus Statement on Inpatient Glycemic Control (2015)  Target Ranges:  Prepandial:   less than 140 mg/dL      Peak postprandial:   less than 180 mg/dL (1-2 hours)      Critically ill patients:  140 - 180 mg/dL   Lab Results  Component Value Date   GLUCAP 282 (H) 08/10/2016   HGBA1C 5.4 07/21/2016   Review of Glycemic ControlResults for Mary [name], Mary [name] (MRN [id number]) as of 08/10/2016 11:25  Ref. Range 08/09/2016 21:22 08/10/2016 07:37  Glucose-Capillary Latest Ref Range: 65 - 99 mg/dL 010309 (H) 272282 (H)   Diabetes history: No history- steroid induced hyperglycemia Outpatient Diabetes medications: None Current orders for Inpatient glycemic control:  Novolog moderate tid with meals and HS  Inpatient Diabetes Program Recommendations:    Please consider increasing frequency of Novolog correction to q 4 hours.   Thanks, Beryl MeagerJenny Talon Witting, RN, BC-ADM Inpatient Diabetes Coordinator Pager [phone] (8a-5p)

## 2016-08-10 NOTE — Care Management Note (Addendum)
Case Management Note  Patient Details  Name: Mary HakimMichelle L [name] MRN: [id number] Date of Birth: 06/19/1976  Subjective/Objective:                 Patient with extensive medical history for age, ETOH abuse (states not current), pulm HTN, CHF, Cirrhosis, DM admitted with COPD exacerbation. Oxygen dependent at home usually uses 2 L but titrated herself up to 4.5 and gave herself a rescue neb prior to coming to ED. [facility] and would like [facility] again after discharge. Nebulizer in good working order, and BiPAP in good working order.  3 admissions in last 6 months. PCP Dr Ezequiel EssexGreg Culins, Pulm Dr Gardiner Fantiamiswany, [facility] [place]    Action/Plan: [facility].   Expected Discharge Date:                  Expected Discharge Plan:     In-House Referral:     Discharge planning Services  CM Consult  Post Acute Care Choice:    Choice offered to:     DME Arranged:    DME Agency:     HH Arranged:    HH Agency:     Status of Service:  In process, will continue to follow  If discussed at Long Length of Stay Meetings, dates discussed:    Additional Comments:  Lawerance SabalDebbie Rumor Sun, RN 08/10/2016, 11:36 AM

## 2016-08-11 ENCOUNTER — Inpatient Hospital Stay (HOSPITAL_COMMUNITY): Payer: 59

## 2016-08-11 DIAGNOSIS — G4733 Obstructive sleep apnea (adult) (pediatric): Secondary | ICD-10-CM

## 2016-08-11 DIAGNOSIS — E669 Obesity, unspecified: Secondary | ICD-10-CM

## 2016-08-11 DIAGNOSIS — I5032 Chronic diastolic (congestive) heart failure: Secondary | ICD-10-CM

## 2016-08-11 DIAGNOSIS — J9621 Acute and chronic respiratory failure with hypoxia: Secondary | ICD-10-CM

## 2016-08-11 DIAGNOSIS — E119 Type 2 diabetes mellitus without complications: Secondary | ICD-10-CM

## 2016-08-11 DIAGNOSIS — J441 Chronic obstructive pulmonary disease with (acute) exacerbation: Principal | ICD-10-CM

## 2016-08-11 LAB — BASIC METABOLIC PANEL
Anion gap: 14 (ref 5–15)
BUN: 16 mg/dL (ref 6–20)
CO2: 36 mmol/L — ABNORMAL HIGH (ref 22–32)
CREATININE: 0.67 mg/dL (ref 0.44–1.00)
Calcium: 9.5 mg/dL (ref 8.9–10.3)
Chloride: 83 mmol/L — ABNORMAL LOW (ref 101–111)
GFR calc Af Amer: >60 mL/min (ref >60)
GLUCOSE: 311 mg/dL — AB (ref 65–99)
Potassium: 4.3 mmol/L (ref 3.5–5.1)
SODIUM: 133 mmol/L — AB (ref 135–145)

## 2016-08-11 LAB — CBC
HCT: 45.4 % (ref 36.0–46.0)
Hemoglobin: 14.3 g/dL (ref 12.0–15.0)
MCH: 32.4 pg (ref 26.0–34.0)
MCHC: 31.5 g/dL (ref 30.0–36.0)
MCV: 102.9 fL — AB (ref 78.0–100.0)
PLATELETS: 160 10*3/uL (ref 150–400)
RBC: 4.41 MIL/uL (ref 3.87–5.11)
RDW: 13.4 % (ref 11.5–15.5)
WBC: 16.7 10*3/uL — ABNORMAL HIGH (ref 4.0–10.5)

## 2016-08-11 LAB — GLUCOSE, CAPILLARY
GLUCOSE-CAPILLARY: 400 mg/dL — AB (ref 65–99)
GLUCOSE-CAPILLARY: 321 mg/dL — AB (ref 65–99)
GLUCOSE-CAPILLARY: 473 mg/dL — AB (ref 65–99)
Glucose-Capillary: 435 mg/dL — ABNORMAL HIGH (ref 65–99)

## 2016-08-11 MED ORDER — INSULIN ASPART 100 UNIT/ML ~~LOC~~ SOLN
5.0000 [IU] | Freq: Three times a day (TID) | SUBCUTANEOUS | Status: DC
Start: 2016-08-12 — End: 2016-08-13
  Administered 2016-08-12 – 2016-08-13 (×6): 5 [IU] via SUBCUTANEOUS

## 2016-08-11 MED ORDER — DOXYCYCLINE HYCLATE 100 MG PO TABS
100.0000 mg | ORAL_TABLET | Freq: Two times a day (BID) | ORAL | Status: DC
  Administered 2016-08-11 – 2016-08-13 (×5): 100 mg via ORAL
  Filled 2016-08-11 (×6): qty 1

## 2016-08-11 MED ORDER — INSULIN GLARGINE 100 UNIT/ML ~~LOC~~ SOLN
22.0000 [IU] | Freq: Every day | SUBCUTANEOUS | Status: DC
Start: 2016-08-12 — End: 2016-08-13
  Administered 2016-08-12 – 2016-08-13 (×2): 22 [IU] via SUBCUTANEOUS
  Filled 2016-08-11 (×2): qty 0.22

## 2016-08-11 MED ORDER — BUDESONIDE 0.25 MG/2ML IN SUSP
0.2500 mg | Freq: Two times a day (BID) | RESPIRATORY_TRACT | Status: DC
  Administered 2016-08-11 – 2016-08-13 (×5): 0.25 mg via RESPIRATORY_TRACT
  Filled 2016-08-11 (×5): qty 2

## 2016-08-11 MED ORDER — METHYLPREDNISOLONE SODIUM SUCC 125 MG IJ SOLR
60.0000 mg | Freq: Three times a day (TID) | INTRAMUSCULAR | Status: DC
  Administered 2016-08-11 – 2016-08-12 (×3): 60 mg via INTRAVENOUS
  Filled 2016-08-11 (×3): qty 2

## 2016-08-11 NOTE — Progress Notes (Addendum)
Inpatient Diabetes Program Recommendations  AACE/ADA: New Consensus Statement on Inpatient Glycemic Control (2015)  Target Ranges:  Prepandial:   less than 140 mg/dL      Peak postprandial:   less than 180 mg/dL (1-2 hours)      Critically ill patients:  140 - 180 mg/dL   Lab Results  Component Value Date   GLUCAP 435 (H) 08/11/2016   HGBA1C 5.4 07/21/2016    Review of Glycemic Control  Diabetes history: DM 2, previously on insulin and Metformin Current orders for Inpatient glycemic control: Lantus 22 units Daily starting tomorrow 9/7, Novolog Resistant + HS scale  Inpatient Diabetes Program Recommendations:   Glucose in the 400's today after 15 of lantus given this morning. Total of 60 units of Novolog given. Please consider increasing Lantus dose to 25 units and give additional 10 units of Lantus today. Also consider Meal coverage while on steroids, Novolog 5 units TID and possibly change frequency of correction scale to Q4 hrs to control glucose levels.  Thanks,  Christena DeemShannon Riham Polyakov RN, MSN, [facility] Inpatient Diabetes Coordinator Team Pager [phone] (8a-5p)

## 2016-08-11 NOTE — Progress Notes (Signed)
PROGRESS NOTE    Mary [name]  UJW:[id number] DOB: 07/29/1976 DOA: 08/09/2016 PCP: Jeanine Luzalone, Gregory, FNP   Brief Narrative:  Mary [name] is a 40 year old female with a past medical history of chronic obstructive pulmonary disease, obstructive sleep apnea, recently admitted from 07/18/2016 through 07/22/2016 where she was treated for a COPD exacerbation, saw her pulmonologist Dr Marchelle Gearingamaswamy on 07/27/2016. No changes were made to her medication regimen. She presented to the emergency room on 08/09/2016 with complaints of worsening shortness of breath associated with cough and wheezing. Felt symptoms secondary to COPD exacerbation as she was started on systemic steroids with Solu-Medrol 60 mg IV every 6 hours along with scheduled duo nebs. Chest x-ray did not reveal acute cardiopulmonary disease.  Assessment & Plan:   Active Problems:   OSA on BiPAP   Hypokalemia   Acute on chronic respiratory failure with hypoxia (HCC)   Chronic diastolic heart failure (HCC)   COPD exacerbation (HCC)   Diabetes mellitus type 2 in obese (HCC)   Diabetic neuropathy (HCC)  1.  Chronic obstructive pulmonary disease exacerbation -She has a history of COPD, recently admitted for COPD exacerbation from 07/18/2016 through 07/22/2016. -Presented with worsening shortness of breath, cough, wheezing. Chest x-ray did not reveal acute cardiopulmonary disease. -Continue SoluMedrol 60 mg IV every 8 hours now, scheduled duo nebs every 4 hours, and pulmicort -will continue flutter valve and mucinex -will also add doxycycline for bronchiectasis   2.  Acute on chronic hypoxemic respiratory failure -Evidence by an O2 sat of 80% on 4 L of supplemental oxygen; normally uses 2-3 L at home -Likely caused by COPD exacerbation -Continue systemic steroids and nebulizer treatments. A chest x-ray did not reveal evidence of CHF or pneumonia; but demonstrated bronchitic changes. -Will add pulmicort and doxycycline -continue weaning O2  supplementation as tolerated   3.  History of diastolic congestive heart failure -Last transthoracic echocardiogram performed on 01/06/2016 that showed grade 1 diastolic dysfunction with preserved ejection fraction -Chest x-ray did not reveal acute CHF -continue monitoring I's and O's and Daily weights.  -Continue torsemide 20 mg by mouth daily and low sodium diet   4.  Type II DM/Hyperglycemia. -Having elevated blood sugars likely related to administration of systemic steroids -Lantus increased to 22 units for better control -will continue SSI and also add novolog meal coverage -anticipate improvement in her CBG's with steroids tapering  5.  Gastroesophageal reflux disease -Will continue Protonix 40 mg by mouth daily   DVT prophylaxis: Lovenox Code Status: Full code Family Communication: no family at bedside Disposition Plan: discharge home in 1-2 days base on improvement and stability of her breathing.    Subjective: Afebrile, no CP, no nausea, no vomiting. Still complaining of SOB and requiring O2 supplementation than chronic baseline use.  Objective: Vitals:   08/10/16 2205 08/11/16 0558 08/11/16 0612 08/11/16 1442  BP: 120/70 110/61  (!) 99/49  Pulse: 83 67  89  Resp: 18 17  18   Temp: 97.5 F (36.4 C) (!) 96.8 F (36 C)    TempSrc: Oral Axillary    SpO2: 91% 92%  94%  Weight:   89.4 kg (197 lb 1.6 oz)   Height:        Intake/Output Summary (Last 24 hours) at 08/11/16 1717 Last data filed at 08/11/16 0935  Gross per 24 hour  Intake              800 ml  Output  0 ml  Net              800 ml   Filed Weights   08/09/16 0903 08/10/16 0500 08/11/16 0612  Weight: 89.8 kg (198 lb) 83.2 kg (183 lb 8 oz) 89.4 kg (197 lb 1.6 oz)    Examination:  General exam: Afebrile, with increase productive cough and even improved still complaining of SOB. Patient requiring 4-5L [place]; at baseline she uses only 2-3L. Denies CP. With mild inability to speak in full  sentences and still with exp wheezing. Respiratory system: Diminished breath sounds bilaterally with mild expiratory wheezes, rhonchi, on 4-5 L supplemental oxygen via nasal cannula Cardiovascular system: S1 & S2 heard, RRR. No JVD, murmurs, rubs, gallops or clicks. No pedal edema. Gastrointestinal system: Abdomen is nondistended, soft and nontender. No organomegaly or masses felt. Normal bowel sounds heard. Central nervous system: Alert and oriented. No focal neurological deficits. Extremities: Symmetric 5 x 5 power. Skin: No rashes, lesions or ulcers Psychiatry: Judgement and insight appear normal. Mood & affect appropriate.     Data Reviewed: I have personally reviewed following labs and imaging studies  CBC:  Recent Labs Lab 08/09/16 0918 08/10/16 0719 08/11/16 0557  WBC 10.7* 12.7* 16.7*  NEUTROABS 8.1*  --   --   HGB 15.5* 14.9 14.3  HCT 48.0* 46.2* 45.4  MCV 101.5* 100.4* 102.9*  PLT 127* 136* 160   Basic Metabolic Panel:  Recent Labs Lab 08/09/16 0918 08/10/16 0719 08/10/16 1821 08/11/16 0557  NA 134* 135  --  133*  K 3.3* 3.9  --  4.3  CL 85* 86*  --  83*  CO2 37* 38*  --  36*  GLUCOSE 131* 280* 423* 311*  BUN <5* 8  --  16  CREATININE 0.54 0.64  --  0.67  CALCIUM 9.2 9.8  --  9.5   GFR: Estimated Creatinine Clearance: 99 mL/min (by C-G formula based on SCr of 0.8 mg/dL).   Liver Function Tests:  Recent Labs Lab 08/10/16 0719  AST 30  ALT 45  ALKPHOS 108  BILITOT 0.8  PROT 7.3  ALBUMIN 3.7   BNP (last 3 results)  Recent Labs  04/20/16 1703  PROBNP 30.0   CBG:  Recent Labs Lab 08/10/16 1651 08/10/16 2135 08/11/16 0745 08/11/16 1202 08/11/16 1706  GLUCAP 478* 354* 400* 435* 321*    Radiology Studies: Dg Chest 2 View  Result Date: 08/11/2016 CLINICAL DATA:  Four days of shortness of breath. History of COPD, pulmonary embolism, pulmonary hypertension, chronic CHF, former smoker. EXAM: CHEST  2 VIEW COMPARISON:  Portable chest x-ray  of August 09, 2016 and PA and lateral chest x-ray of July 18, 2016. FINDINGS: The lungs are mildly hyperinflated. The interstitial markings are coarse in the lower lung zones but appear fairly stable. There is no alveolar infiltrate or pleural effusion. There is no pneumothorax. The heart is top-normal in size. The pulmonary vascularity is mildly prominent centrally and more conspicuous than on the previous study. The mediastinum is normal in width. The bony thorax exhibits no acute abnormality. IMPRESSION: COPD. Low-grade pulmonary interstitial edema. No alveolar pneumonia nor pleural effusion. Electronically Signed   By: David  [place] M.D.   On: 08/11/2016 08:08    Scheduled Meds: . budesonide (PULMICORT) nebulizer solution  0.25 mg Nebulization BID  . doxycycline  100 mg Oral Q12H  . enoxaparin (LOVENOX) injection  40 mg Subcutaneous Q24H  . fluticasone furoate-vilanterol  1 puff Inhalation Daily  . guaiFENesin  1,200 mg Oral BID  . insulin aspart  0-20 Units Subcutaneous TID WC  . insulin aspart  0-5 Units Subcutaneous QHS  . [START ON 08/12/2016] insulin aspart  5 Units Subcutaneous TID WC  . [START ON 08/12/2016] insulin glargine  22 Units Subcutaneous Daily  . ipratropium-albuterol  3 mL Nebulization TID  . methylPREDNISolone (SOLU-MEDROL) injection  60 mg Intravenous Q8H  . pantoprazole  40 mg Oral BID AC  . potassium chloride SA  40 mEq Oral Daily  . pregabalin  50 mg Oral TID  . roflumilast  500 mcg Oral Daily  . sodium chloride flush  3 mL Intravenous Q12H  . torsemide  20 mg Oral Daily   Continuous Infusions:    LOS: 2 days    Time spent: 30 min    Vassie Loll, MD [facility] Pager [phone]  If 7PM-7AM, please contact night-coverage [email or web address] Password [email or web address] 08/11/2016, 5:17 PM

## 2016-08-11 NOTE — Care Management Note (Signed)
Case Management Note  Patient Details  Name: Mary [name] MRN: [id number] Date of Birth: 10/03/1976  Subjective/Objective:                    Action/Plan:   Expected Discharge Date:                  Expected Discharge Plan:  Home w Home Health Services  In-House Referral:  NA  Discharge planning Services  CM Consult  Post Acute Care Choice:  NA Choice offered to:  Patient  DME Arranged:  N/A DME Agency:  NA  HH Arranged:    HH Agency:     Status of Service:  In process, will continue to follow  If discussed at Long Length of Stay Meetings, dates discussed:    Additional Comments:  Lawerance SabalDebbie Shanelle Clontz, RN 08/11/2016, 1:40 PM

## 2016-08-12 DIAGNOSIS — E876 Hypokalemia: Secondary | ICD-10-CM

## 2016-08-12 LAB — GLUCOSE, CAPILLARY
GLUCOSE-CAPILLARY: 355 mg/dL — AB (ref 65–99)
GLUCOSE-CAPILLARY: 277 mg/dL — AB (ref 65–99)
Glucose-Capillary: 326 mg/dL — ABNORMAL HIGH (ref 65–99)
Glucose-Capillary: 295 mg/dL — ABNORMAL HIGH (ref 65–99)

## 2016-08-12 MED ORDER — ZOLPIDEM TARTRATE 5 MG PO TABS
5.0000 mg | ORAL_TABLET | Freq: Every evening | ORAL | Status: DC | PRN
  Administered 2016-08-12 (×2): 5 mg via ORAL
  Filled 2016-08-12 (×2): qty 1

## 2016-08-12 MED ORDER — PREDNISONE 50 MG PO TABS
50.0000 mg | ORAL_TABLET | Freq: Two times a day (BID) | ORAL | Status: DC
  Administered 2016-08-12 – 2016-08-13 (×3): 50 mg via ORAL
  Filled 2016-08-12 (×3): qty 1

## 2016-08-12 MED ORDER — IPRATROPIUM-ALBUTEROL 0.5-2.5 (3) MG/3ML IN SOLN
3.0000 mL | Freq: Two times a day (BID) | RESPIRATORY_TRACT | Status: DC
  Administered 2016-08-12 – 2016-08-13 (×2): 3 mL via RESPIRATORY_TRACT
  Filled 2016-08-12 (×2): qty 3

## 2016-08-12 NOTE — Progress Notes (Signed)
PROGRESS NOTE    Mary [name]  ZOX:[id number] DOB: 24-Apr-1976 DOA: 08/09/2016 PCP: Jeanine Luz, FNP   Brief Narrative:  [name] is a 40 year old female with a past medical history of chronic obstructive pulmonary disease, obstructive sleep apnea, recently admitted from 07/18/2016 through 07/22/2016 where she was treated for a COPD exacerbation, saw her pulmonologist Dr Marchelle Gearing on 07/27/2016. No changes were made to her medication regimen. She presented to the emergency room on 08/09/2016 with complaints of worsening shortness of breath associated with cough and wheezing. Felt symptoms secondary to COPD exacerbation as she was started on systemic steroids with Solu-Medrol 60 mg IV every 6 hours along with scheduled duo nebs. Chest x-ray did not reveal acute cardiopulmonary disease.  Assessment & Plan:   Active Problems:   OSA on BiPAP   Hypokalemia   Acute on chronic respiratory failure with hypoxia (HCC)   Chronic diastolic heart failure (HCC)   COPD exacerbation (HCC)   Diabetes mellitus type 2 in obese (HCC)   Diabetic neuropathy (HCC)  1.  Chronic obstructive pulmonary disease exacerbation -She has a history of COPD, recently admitted for COPD exacerbation from 07/18/2016 through 07/22/2016. -Presented with worsening shortness of breath, cough, wheezing. Chest x-ray did not reveal acute cardiopulmonary disease. -will transition to prednisone and start slow tapering; will observed for potential bronchospasm, continue scheduled duo nebs every 4 hours, and pulmicort -will continue flutter valve and mucinex -will also add doxycycline for bronchiectasis   2.  Acute on chronic hypoxemic respiratory failure -Evidence by an O2 sat of 80% on 4 L of supplemental oxygen; normally uses 2-3 L at home -Likely caused by COPD exacerbation -Continue systemic steroids and nebulizer treatments. A chest x-ray did not reveal evidence of CHF or pneumonia; but demonstrated bronchitic  changes. -Will continue pulmicort and doxycycline -continue weaning O2 supplementation as tolerated   3.  History of diastolic congestive heart failure -Last transthoracic echocardiogram performed on 01/06/2016 that showed grade 1 diastolic dysfunction with preserved ejection fraction -Chest x-ray did not reveal acute CHF -continue monitoring I's and O's and Daily weights.  -Continue torsemide 20 mg by mouth daily and low sodium diet   4.  Type II DM/Hyperglycemia. -Having elevated blood sugars likely related to administration of systemic steroids -Lantus increased to 22 units for better control on 08/11/16 -will continue SSI and also add novolog meal coverage -anticipate improvement in her CBG's with steroids tapering  5.  Gastroesophageal reflux disease -Will continue Protonix 40 mg by mouth daily   DVT prophylaxis: Lovenox Code Status: Full code Family Communication: no family at bedside Disposition Plan: discharge home in am; will transition steroids to prednisone and observed for potential bronchospasm. Continue weaning O2 to baseline regimen     Subjective: Afebrile, no CP, no nausea, no vomiting. Improved air movement and requiring less O2 supplementation. Patient is almost able to speak in full sentences and is barely wheezing.  Objective: Vitals:   08/11/16 1953 08/11/16 2011 08/11/16 2107 08/12/16 0630  BP:   116/77 124/74  Pulse: 84 87 78 71  Resp: 18 18 18 18   Temp:   97.5 F (36.4 C) 98.8 F (37.1 C)  TempSrc:   Oral Oral  SpO2: 95% 96% 93% 94%  Weight:    89.5 kg (197 lb 5 oz)  Height:       No intake or output data in the 24 hours ending 08/12/16 1656 Filed Weights   08/10/16 0500 08/11/16 0612 08/12/16 0630  Weight: 83.2 kg (183  lb 8 oz) 89.4 kg (197 lb 1.6 oz) 89.5 kg (197 lb 5 oz)    Examination:  General exam: Afebrile, with improved SOB and barely wheezing. Patient requiring 2.5L [place]; at baseline she uses only 2L. Denies CP. With improve ability  speaking in full sentences.  Respiratory system: Diminished breath sounds bilaterally, even improved in comparison to admission physical exam. No frank wheezes or rhonchi, on 2.5 L supplemental oxygen via nasal cannula Cardiovascular system: S1 & S2 heard, RRR. No JVD, murmurs, rubs, gallops or clicks. No pedal edema. Gastrointestinal system: Abdomen is nondistended, soft and nontender. No organomegaly or masses felt. Normal bowel sounds heard. Central nervous system: Alert and oriented. No focal neurological deficits. Extremities: Symmetric 5 x 5 power. Skin: No rashes, lesions or ulcers Psychiatry: Judgement and insight appear normal. Mood & affect appropriate.     Data Reviewed: I have personally reviewed following labs and imaging studies  CBC:  Recent Labs Lab 08/09/16 0918 08/10/16 0719 08/11/16 0557  WBC 10.7* 12.7* 16.7*  NEUTROABS 8.1*  --   --   HGB 15.5* 14.9 14.3  HCT 48.0* 46.2* 45.4  MCV 101.5* 100.4* 102.9*  PLT 127* 136* 160   Basic Metabolic Panel:  Recent Labs Lab 08/09/16 0918 08/10/16 0719 08/10/16 1821 08/11/16 0557  NA 134* 135  --  133*  K 3.3* 3.9  --  4.3  CL 85* 86*  --  83*  CO2 37* 38*  --  36*  GLUCOSE 131* 280* 423* 311*  BUN <5* 8  --  16  CREATININE 0.54 0.64  --  0.67  CALCIUM 9.2 9.8  --  9.5   GFR: Estimated Creatinine Clearance: 99.2 mL/min (by C-G formula based on SCr of 0.8 mg/dL).   Liver Function Tests:  Recent Labs Lab 08/10/16 0719  AST 30  ALT 45  ALKPHOS 108  BILITOT 0.8  PROT 7.3  ALBUMIN 3.7   BNP (last 3 results)  Recent Labs  04/20/16 1703  PROBNP 30.0   CBG:  Recent Labs Lab 08/11/16 1202 08/11/16 1706 08/11/16 2104 08/12/16 0604 08/12/16 1254  GLUCAP 435* 321* 473* 326* 355*    Radiology Studies: Dg Chest 2 View  Result Date: 08/11/2016 CLINICAL DATA:  Four days of shortness of breath. History of COPD, pulmonary embolism, pulmonary hypertension, chronic CHF, former smoker. EXAM: CHEST  2  VIEW COMPARISON:  Portable chest x-ray of August 09, 2016 and PA and lateral chest x-ray of July 18, 2016. FINDINGS: The lungs are mildly hyperinflated. The interstitial markings are coarse in the lower lung zones but appear fairly stable. There is no alveolar infiltrate or pleural effusion. There is no pneumothorax. The heart is top-normal in size. The pulmonary vascularity is mildly prominent centrally and more conspicuous than on the previous study. The mediastinum is normal in width. The bony thorax exhibits no acute abnormality. IMPRESSION: COPD. Low-grade pulmonary interstitial edema. No alveolar pneumonia nor pleural effusion. Electronically Signed   By: David  [place] M.D.   On: 08/11/2016 08:08    Scheduled Meds: . budesonide (PULMICORT) nebulizer solution  0.25 mg Nebulization BID  . doxycycline  100 mg Oral Q12H  . enoxaparin (LOVENOX) injection  40 mg Subcutaneous Q24H  . fluticasone furoate-vilanterol  1 puff Inhalation Daily  . guaiFENesin  1,200 mg Oral BID  . insulin aspart  0-20 Units Subcutaneous TID WC  . insulin aspart  0-5 Units Subcutaneous QHS  . insulin aspart  5 Units Subcutaneous TID WC  . insulin  glargine  22 Units Subcutaneous Daily  . ipratropium-albuterol  3 mL Nebulization BID  . pantoprazole  40 mg Oral BID AC  . potassium chloride SA  40 mEq Oral Daily  . predniSONE  50 mg Oral BID WC  . pregabalin  50 mg Oral TID  . roflumilast  500 mcg Oral Daily  . sodium chloride flush  3 mL Intravenous Q12H  . torsemide  20 mg Oral Daily   Continuous Infusions:    LOS: 3 days    Time spent: 30 min    Vassie LollMadera, Elisandra Deshmukh, MD [facility] Pager [phone]  If 7PM-7AM, please contact night-coverage [email or web address] Password [email or web address] 08/12/2016, 4:56 PM

## 2016-08-12 NOTE — Progress Notes (Signed)
Noted Lantus was increased from 15 to 22 units on 08/11/16 but patient has not yet received increased Lantus dose yet. Fasting glucose 326 mg/dl today and 409355 mg/dl at 81:1912:54 today. Spoke with Herbert SetaHeather, RN to inquire about Lantus since it was ordered to be given at 10am and patient still has not received at this time. Heather, RN reports that she had to notify pharmacy for Lantus dose and that she was planning to administer the Lantus to patient when she takes in Novolog insulin for glucose of 355 mg/dl and Novolog meal coverage. Also note that Novolog 5 units TID with meals for meal coverage was also started for today. Will continue to follow and make further recommendations as more data is collected.  Thanks, Orlando PennerMarie Camrin Lapre, RN, MSN, CDE Diabetes Coordinator Inpatient Diabetes Program [phone] (Team Pager from 8am to 5pm) [phone] ([facility] office) [phone] [facility] office) [phone] [facility] office)

## 2016-08-13 DIAGNOSIS — E1142 Type 2 diabetes mellitus with diabetic polyneuropathy: Secondary | ICD-10-CM

## 2016-08-13 LAB — GLUCOSE, CAPILLARY
GLUCOSE-CAPILLARY: 277 mg/dL — AB (ref 65–99)
Glucose-Capillary: 313 mg/dL — ABNORMAL HIGH (ref 65–99)
Glucose-Capillary: 223 mg/dL — ABNORMAL HIGH (ref 65–99)

## 2016-08-13 MED ORDER — PREDNISONE 20 MG PO TABS: ORAL_TABLET | ORAL | 0 refills | Status: DC

## 2016-08-13 MED ORDER — GUAIFENESIN ER 600 MG PO TB12: 1200.0000 mg | ORAL_TABLET | Freq: Two times a day (BID) | ORAL | 0 refills | Status: DC

## 2016-08-13 MED ORDER — DOXYCYCLINE HYCLATE 100 MG PO TABS: 100.0000 mg | ORAL_TABLET | Freq: Two times a day (BID) | ORAL | 0 refills | Status: DC

## 2016-08-13 NOTE — Discharge Summary (Signed)
Physician Discharge Summary  Mary [name] TRZ:[id number] DOB: May 08, 1976 DOA: 08/09/2016  PCP: Mauricio Po, FNP  Admit date: 08/09/2016 Discharge date: 08/13/2016  Time spent: 35 minutes  Recommendations for Outpatient Follow-up:  1. Repeat BMET to follow electrolytes and renal function  2. Reassess CBG's and if needed adjust hypoglycemic regimen   Discharge Diagnoses:  Acute on chronic respiratory failure with hypoxia (HCC) COPD exacerbation (HCC) OSA on BiPAP Hypokalemia Chronic diastolic heart failure (HCC) Diabetes mellitus type 2 in obese [facility]) Diabetic neuropathy ([place])   Discharge Condition: stable and improved. Patient with oxygen needs back to baseline (2L); will discharge with instructions to follow up with PCP in 10 days and with pulmonology in 2-3 weeks.  Diet recommendation: low calorie and modified carbohydrates diet   Filed Weights   08/11/16 0612 08/12/16 0630 08/13/16 0541  Weight: 89.4 kg (197 lb 1.6 oz) 89.5 kg (197 lb 5 oz) 89.6 kg (197 lb 8.5 oz)    History of present illness:  40 year old female with a past medical history of chronic obstructive pulmonary disease, obstructive sleep apnea, recently admitted from 07/18/2016 through 07/22/2016 where she was treated for a COPD exacerbation, saw her pulmonologist Dr Chase Caller on 07/27/2016. No changes were made to her medication regimen. She presented to the emergency room on 08/09/2016 with complaints of worsening shortness of breath associated with cough and wheezing. Felt symptoms secondary to COPD exacerbation as she was started on systemic steroids with Solu-Medrol 60 mg IV every 6 hours along with scheduled duo nebs. Chest x-ray demonstrating bronchitic changes; and no frank acute infiltrates.  Hospital Course:  1.  Chronic obstructive pulmonary disease exacerbation -She has a history of COPD, recently admitted for COPD exacerbation from 07/18/2016 through 07/22/2016. -Presented with worsening shortness  of breath, cough, wheezing. Chest x-ray did not reveal acute cardiopulmonary disease. -will discharge on prednisone tapering, continue home inhaler/nebulizer therapy -will continue flutter valve and mucinex -will also use doxycycline for bronchiectasis   2.  Acute on chronic hypoxemic respiratory failure -Evidence by an O2 sat of 80% on 4 L of supplemental oxygen on admission; normally uses 2-3 L at home -Likely caused by COPD exacerbation -Continue treatment for COPD as mentioned at above  -Will continue oxygen supplementation and tx of bronchiectasis with doxycycline  3.  History of diastolic congestive heart failure -Last transthoracic echocardiogram performed on 01/06/2016 that showed grade 1 diastolic dysfunction with preserved ejection fraction -Chest x-ray did not reveal acute CHF -continue Daily weights.  -Continue torsemide 20 mg by mouth daily and low sodium diet   4.  Type II DM/Hyperglycemia. -Having elevated blood sugars likely related to administration of systemic steroids -will resume home hypoglycemic regimen  -anticipate improvement in her CBG's with steroids tapering -advise to follow low carb diet   5.  Gastroesophageal reflux disease -Will continue Protonix 40 mg by mouth daily  6.  Obesity -Body mass index is 36.13 kg/m. -low calorie diet and portion control discussed with patient   7-  OSA -continue CPAP QHS  Procedures:  See below for x-ray reports   Consultations:  None   Discharge Exam: Vitals:   08/13/16 0705 08/13/16 1407  BP: 125/84 106/67  Pulse: 65 77  Resp: 20 18  Temp: 98 F (36.7 C) 98.3 F (36.8 C)   General exam: Afebrile, with improved SOB and no wheezing appreciated at discharge. Patient requiring 2.0L [place] (home baseline regimen). Denies CP. Able to speak in full sentences and with good maintained O2 sat on exertion.Marland Kitchen  Respiratory system: Diminished breath sounds bilaterally, even improved in comparison to admission  physical exam. No frank wheezes or rhonchi, on 2.0 L supplemental oxygen via nasal cannula Cardiovascular system: S1 & S2 heard, RRR. No JVD, murmurs, rubs, gallops or clicks. No pedal edema. Gastrointestinal system: Abdomen is nondistended, soft and nontender. No organomegaly or masses felt. Normal bowel sounds heard. Central nervous system: Alert and oriented. No focal neurological deficits. Extremities: Symmetric 5 x 5 power. Skin: No rashes, lesions or ulcers Psychiatry: Judgement and insight appear normal. Mood & affect appropriate.    Discharge Instructions   Discharge Instructions    Diet - low sodium heart healthy    Complete by:  As directed   Discharge instructions    Complete by:  As directed   Take medications as prescribed  Arrange follow up with PCP in 10 days Arrange follow up with your pulmonologist in 2-3 weeks Keep yourself well hydrated     Current Discharge Medication List    START taking these medications   Details  predniSONE (DELTASONE) 20 MG tablet Take 3 tablets by mouth twice a day X 1 day; then 3 tablets by mouth daily X 1 day; then 2 tablets by mouth daily x 2 days; then 1 tablet by mouth daily X 2 days; then 1/2 tablet by mouth daily X 3 days and stop prednisone Qty: 20 tablet, Refills: 0      CONTINUE these medications which have CHANGED   Details  doxycycline (VIBRA-TABS) 100 MG tablet Take 1 tablet (100 mg total) by mouth every 12 (twelve) hours. Qty: 14 tablet, Refills: 0    guaiFENesin (MUCINEX) 600 MG 12 hr tablet Take 2 tablets (1,200 mg total) by mouth 2 (two) times daily. Qty: 40 tablet, Refills: 0      CONTINUE these medications which have NOT CHANGED   Details  albuterol (PROVENTIL HFA;VENTOLIN HFA) 108 (90 BASE) MCG/ACT inhaler Inhale 2 puffs into the lungs every 6 (six) hours as needed for wheezing or shortness of breath. Qty: 8.5 g, Refills: 1    BREO ELLIPTA 100-25 MCG/INH AEPB INHALE 1 PUFF INTO THE LUNGS DAILY. Qty: 60 each,  Refills: 3    pantoprazole (PROTONIX) 40 MG tablet Take 1 tablet (40 mg total) by mouth 2 (two) times daily before a meal. Qty: 60 tablet, Refills: 0    potassium chloride SA (K-DUR,KLOR-CON) 20 MEQ tablet Take 1 tablet (20 mEq total) by mouth daily. Qty: 30 tablet, Refills: 6    pregabalin (LYRICA) 50 MG capsule Take 1 capsule (50 mg total) by mouth 3 (three) times daily. Qty: 90 capsule, Refills: 1   Associated Diagnoses: Diabetic polyneuropathy associated with type 2 diabetes mellitus (HCC)    roflumilast (DALIRESP) 500 MCG TABS tablet Take 500 mcg by mouth daily.    Tiotropium Bromide Monohydrate (SPIRIVA RESPIMAT) 1.25 MCG/ACT AERS Inhale 2 puffs into the lungs 2 (two) times daily. Qty: 1 Inhaler, Refills: 3    torsemide (DEMADEX) 20 MG tablet Take 1 tablet (20 mg total) by mouth daily. Qty: 90 tablet, Refills: 0    traMADol (ULTRAM) 50 MG tablet Take 1 tablet (50 mg total) by mouth every 6 (six) hours as needed for severe pain. Qty: 120 tablet, Refills: 0   Associated Diagnoses: Diabetic polyneuropathy associated with type 2 diabetes mellitus ([place])    blood glucose meter kit and supplies KIT Dispense based on patient and insurance preference. Use up to four times daily as directed. (FOR ICD-9 250.00, 250.01). Qty: 1 each,  Refills: 0      STOP taking these medications     predniSONE (STERAPRED UNI-PAK 21 TAB) 10 MG (21) TBPK tablet        No Known Allergies Follow-up Information    [facility] .   Why:  [facility] home health nurse, they will call you in the next 1-2 days to set up your first home appointment Contact information: [place] [address] [phone]        Mauricio Po, [place]. Schedule an appointment as soon as possible for a visit in 10 day(s).   Specialty:  Family Medicine Contact information: [place] [address] [phone]        RAMASWAMY,MURALI, MD. Schedule an appointment as soon as  possible for a visit in 3 week(s).   Specialty:  Pulmonary Disease Contact information: [place] [address] [phone]           The results of significant diagnostics from this hospitalization (including imaging, microbiology, ancillary and laboratory) are listed below for reference.    Significant Diagnostic Studies: Dg Chest 2 View  Result Date: 08/11/2016 CLINICAL DATA:  Four days of shortness of breath. History of COPD, pulmonary embolism, pulmonary hypertension, chronic CHF, former smoker. EXAM: CHEST  2 VIEW COMPARISON:  Portable chest x-ray of August 09, 2016 and PA and lateral chest x-ray of July 18, 2016. FINDINGS: The lungs are mildly hyperinflated. The interstitial markings are coarse in the lower lung zones but appear fairly stable. There is no alveolar infiltrate or pleural effusion. There is no pneumothorax. The heart is top-normal in size. The pulmonary vascularity is mildly prominent centrally and more conspicuous than on the previous study. The mediastinum is normal in width. The bony thorax exhibits no acute abnormality. IMPRESSION: COPD. Low-grade pulmonary interstitial edema. No alveolar pneumonia nor pleural effusion. Electronically Signed   By: David  Martinique M.D.   On: 08/11/2016 08:08   Dg Chest 2 View  Result Date: 07/18/2016 CLINICAL DATA:  Acute onset of shortness of breath. Initial encounter. EXAM: CHEST  2 VIEW COMPARISON:  Chest radiograph performed 04/05/2016 FINDINGS: The lungs are well-aerated. Mild vascular congestion is noted. Mild bibasilar opacities may reflect mild interstitial edema. No significant pleural effusion or pneumothorax is seen. The heart is borderline normal in size. No acute osseous abnormalities are seen. IMPRESSION: Mild vascular congestion noted. Mild bibasilar opacities may reflect mild interstitial edema. Electronically Signed   By: Garald Balding M.D.   On: 07/18/2016 04:33   Dg Chest Port 1 View  Result Date:  08/09/2016 CLINICAL DATA:  COPD exacerbation EXAM: PORTABLE CHEST 1 VIEW COMPARISON:  07/18/2016 FINDINGS: Lungs are clear.  No pleural effusion or pneumothorax. The heart is top-normal in size. IMPRESSION: No evidence of acute cardiopulmonary disease. Electronically Signed   By: Julian Hy M.D.   On: 08/09/2016 09:42   US Abdomen Limited Ruq  Result Date: 07/22/2016 CLINICAL DATA:  Elevated liver function studies. EXAM: US ABDOMEN LIMITED - RIGHT UPPER QUADRANT COMPARISON:  CT abdomen and pelvis 12/13/2014 FINDINGS: Gallbladder: No gallstones or wall thickening visualized. No sonographic Murphy sign noted by sonographer. Common bile duct: Diameter: 3.3 mm, normal Liver: Diffusely increased hepatic parenchymal echotexture consistent with diffuse fatty infiltration. IMPRESSION: Diffuse fatty infiltration of the liver. No evidence of cholelithiasis or cholecystitis. Electronically Signed   By: Lucienne Capers M.D.   On: 07/22/2016 06:16    Microbiology: No results found for this or any previous  visit (from the past 240 hour(s)).   Labs: Basic Metabolic Panel:  Recent Labs Lab 08/09/16 0918 08/10/16 0719 08/10/16 1821 08/11/16 0557  NA 134* 135  --  133*  K 3.3* 3.9  --  4.3  CL 85* 86*  --  83*  CO2 37* 38*  --  36*  GLUCOSE 131* 280* 423* 311*  BUN <5* 8  --  16  CREATININE 0.54 0.64  --  0.67  CALCIUM 9.2 9.8  --  9.5   Liver Function Tests:  Recent Labs Lab 08/10/16 0719  AST 30  ALT 45  ALKPHOS 108  BILITOT 0.8  PROT 7.3  ALBUMIN 3.7   CBC:  Recent Labs Lab 08/09/16 0918 08/10/16 0719 08/11/16 0557  WBC 10.7* 12.7* 16.7*  NEUTROABS 8.1*  --   --   HGB 15.5* 14.9 14.3  HCT 48.0* 46.2* 45.4  MCV 101.5* 100.4* 102.9*  PLT 127* 136* 160    BNP (last 3 results)  Recent Labs  12/19/15 0440 04/01/16 1750 07/18/16 0400  BNP 192.9* 19.4 100.3*    ProBNP (last 3 results)  Recent Labs  04/20/16 1703  PROBNP 30.0    CBG:  Recent Labs Lab  08/12/16 1254 08/12/16 1715 08/12/16 2127 08/13/16 0747 08/13/16 1201  GLUCAP 355* 277* 295* 277* 313*     Signed:  Barton Dubois MD.  [facility] 08/13/2016, 4:16 PM

## 2016-08-13 NOTE — Progress Notes (Addendum)
Inpatient Diabetes Program Recommendations  AACE/ADA: New Consensus Statement on Inpatient Glycemic Control (2015)  Target Ranges:  Prepandial:   less than 140 mg/dL      Peak postprandial:   less than 180 mg/dL (1-2 hours)      Critically ill patients:  140 - 180 mg/dL   Lab Results  Component Value Date   GLUCAP 313 (H) 08/13/2016   HGBA1C 5.4 07/21/2016    Review of Glycemic Control  Diabetes history: DM 2, previously on insulin and Metformin  Inpatient Diabetes Program Recommendations: Insulin - Basal: Glucose is elevated in the 270's-300's. Consider increasing Lantus to 26 units. Will need to decrease basal as steroids are tapered in a day or two.   Thanks,  Christena DeemShannon Kamaron Deskins RN, MSN, [facility] Inpatient Diabetes Coordinator Team Pager [phone] (8a-5p)

## 2016-08-13 NOTE — Progress Notes (Signed)
Jana HakimMichelle L Kamau to be D/C'd Home per MD order.  Discussed with the patient and all questions fully answered. Patient's RN Herbert SetaHeather stated patient stable for discharge.   An After Visit Summary was printed and given to the patient. Patient received prescription.  D/c education completed with patient/family including follow up instructions, medication list, d/c activities limitations if indicated, with other d/c instructions as indicated by MD - patient able to verbalize understanding, all questions fully answered.   Patient instructed to return to ED, call 911, or call MD for any changes in condition.    L'ESPERANCE, Maxten Shuler C 08/13/2016 5:51 PM

## 2016-08-13 NOTE — Progress Notes (Signed)
RT NOTE:  Pt states that she manages CPAP @ bedside. She will call RT if assistance needed.

## 2016-08-15 ENCOUNTER — Other Ambulatory Visit: Payer: Self-pay | Admitting: Internal Medicine

## 2016-08-19 ENCOUNTER — Other Ambulatory Visit: Payer: Self-pay | Admitting: Family

## 2016-08-19 DIAGNOSIS — E1142 Type 2 diabetes mellitus with diabetic polyneuropathy: Secondary | ICD-10-CM

## 2016-08-19 NOTE — Telephone Encounter (Signed)
Faxed script bck to [facility].../lmb 

## 2016-08-25 ENCOUNTER — Encounter (HOSPITAL_COMMUNITY): Payer: Self-pay

## 2016-08-25 ENCOUNTER — Inpatient Hospital Stay (HOSPITAL_COMMUNITY)
Admission: EM | Admit: 2016-08-25 | Discharge: 2016-08-28 | DRG: 189 | Disposition: A | Discharge status: Home / Self Care | Payer: 59 | Attending: Internal Medicine | Admitting: Internal Medicine

## 2016-08-25 ENCOUNTER — Emergency Department (HOSPITAL_COMMUNITY): Payer: 59

## 2016-08-25 DIAGNOSIS — Z86711 Personal history of pulmonary embolism: Secondary | ICD-10-CM

## 2016-08-25 DIAGNOSIS — K219 Gastro-esophageal reflux disease without esophagitis: Secondary | ICD-10-CM | POA: Diagnosis present

## 2016-08-25 DIAGNOSIS — G4733 Obstructive sleep apnea (adult) (pediatric): Secondary | ICD-10-CM | POA: Diagnosis present

## 2016-08-25 DIAGNOSIS — Z6836 Body mass index (BMI) 36.0-36.9, adult: Secondary | ICD-10-CM

## 2016-08-25 DIAGNOSIS — J398 Other specified diseases of upper respiratory tract: Secondary | ICD-10-CM | POA: Diagnosis present

## 2016-08-25 DIAGNOSIS — J441 Chronic obstructive pulmonary disease with (acute) exacerbation: Secondary | ICD-10-CM | POA: Diagnosis not present

## 2016-08-25 DIAGNOSIS — Z87891 Personal history of nicotine dependence: Secondary | ICD-10-CM

## 2016-08-25 DIAGNOSIS — Z789 Other specified health status: Secondary | ICD-10-CM

## 2016-08-25 DIAGNOSIS — J9622 Acute and chronic respiratory failure with hypercapnia: Secondary | ICD-10-CM | POA: Diagnosis present

## 2016-08-25 DIAGNOSIS — R0602 Shortness of breath: Secondary | ICD-10-CM | POA: Diagnosis not present

## 2016-08-25 DIAGNOSIS — E1140 Type 2 diabetes mellitus with diabetic neuropathy, unspecified: Secondary | ICD-10-CM | POA: Diagnosis present

## 2016-08-25 DIAGNOSIS — I2781 Cor pulmonale (chronic): Secondary | ICD-10-CM | POA: Diagnosis present

## 2016-08-25 DIAGNOSIS — Z7189 Other specified counseling: Secondary | ICD-10-CM

## 2016-08-25 DIAGNOSIS — F411 Generalized anxiety disorder: Secondary | ICD-10-CM

## 2016-08-25 DIAGNOSIS — I5032 Chronic diastolic (congestive) heart failure: Secondary | ICD-10-CM | POA: Diagnosis not present

## 2016-08-25 DIAGNOSIS — Z515 Encounter for palliative care: Secondary | ICD-10-CM

## 2016-08-25 DIAGNOSIS — I272 Other secondary pulmonary hypertension: Secondary | ICD-10-CM | POA: Diagnosis present

## 2016-08-25 DIAGNOSIS — E118 Type 2 diabetes mellitus with unspecified complications: Secondary | ICD-10-CM | POA: Diagnosis not present

## 2016-08-25 DIAGNOSIS — J449 Chronic obstructive pulmonary disease, unspecified: Secondary | ICD-10-CM

## 2016-08-25 DIAGNOSIS — Z86718 Personal history of other venous thrombosis and embolism: Secondary | ICD-10-CM

## 2016-08-25 DIAGNOSIS — G8929 Other chronic pain: Secondary | ICD-10-CM | POA: Diagnosis present

## 2016-08-25 DIAGNOSIS — T380X5A Adverse effect of glucocorticoids and synthetic analogues, initial encounter: Secondary | ICD-10-CM | POA: Diagnosis present

## 2016-08-25 DIAGNOSIS — J309 Allergic rhinitis, unspecified: Secondary | ICD-10-CM | POA: Diagnosis present

## 2016-08-25 DIAGNOSIS — E1165 Type 2 diabetes mellitus with hyperglycemia: Secondary | ICD-10-CM | POA: Diagnosis present

## 2016-08-25 DIAGNOSIS — Z9981 Dependence on supplemental oxygen: Secondary | ICD-10-CM

## 2016-08-25 DIAGNOSIS — K7460 Unspecified cirrhosis of liver: Secondary | ICD-10-CM | POA: Diagnosis present

## 2016-08-25 DIAGNOSIS — J9621 Acute and chronic respiratory failure with hypoxia: Principal | ICD-10-CM | POA: Diagnosis present

## 2016-08-25 DIAGNOSIS — R490 Dysphonia: Secondary | ICD-10-CM | POA: Diagnosis present

## 2016-08-25 LAB — CBC WITH DIFFERENTIAL/PLATELET
Basophils Absolute: 0 10*3/uL (ref 0.0–0.1)
Basophils Relative: 0 %
Eosinophils Absolute: 0.2 10*3/uL (ref 0.0–0.7)
Eosinophils Relative: 2 %
HEMATOCRIT: 44.9 % (ref 36.0–46.0)
HEMOGLOBIN: 13.8 g/dL (ref 12.0–15.0)
LYMPHS ABS: 1.2 10*3/uL (ref 0.7–4.0)
Lymphocytes Relative: 11 %
MCH: 31.4 pg (ref 26.0–34.0)
MCHC: 30.7 g/dL (ref 30.0–36.0)
MCV: 102.3 fL — AB (ref 78.0–100.0)
MONO ABS: 0.7 10*3/uL (ref 0.1–1.0)
Monocytes Relative: 7 %
Neutro Abs: 8.5 10*3/uL — ABNORMAL HIGH (ref 1.7–7.7)
Neutrophils Relative %: 80 %
Platelets: 157 10*3/uL (ref 150–400)
RBC: 4.39 MIL/uL (ref 3.87–5.11)
RDW: 13.6 % (ref 11.5–15.5)
WBC: 10.7 10*3/uL — AB (ref 4.0–10.5)

## 2016-08-25 LAB — GLUCOSE, CAPILLARY
Glucose-Capillary: 161 mg/dL — ABNORMAL HIGH (ref 65–99)
Glucose-Capillary: 362 mg/dL — ABNORMAL HIGH (ref 65–99)

## 2016-08-25 LAB — COMPREHENSIVE METABOLIC PANEL
ALK PHOS: 105 U/L (ref 38–126)
ALT: 52 U/L (ref 14–54)
ANION GAP: 10 (ref 5–15)
AST: 34 U/L (ref 15–41)
Albumin: 3.7 g/dL (ref 3.5–5.0)
BILIRUBIN TOTAL: 0.6 mg/dL (ref 0.3–1.2)
BUN: 7 mg/dL (ref 6–20)
CALCIUM: 9.5 mg/dL (ref 8.9–10.3)
CO2: 35 mmol/L — ABNORMAL HIGH (ref 22–32)
Chloride: 93 mmol/L — ABNORMAL LOW (ref 101–111)
Creatinine, Ser: 0.53 mg/dL (ref 0.44–1.00)
GFR calc Af Amer: >60 mL/min (ref >60)
GLUCOSE: 191 mg/dL — AB (ref 65–99)
POTASSIUM: 3.4 mmol/L — AB (ref 3.5–5.1)
Sodium: 138 mmol/L (ref 135–145)
TOTAL PROTEIN: 6.8 g/dL (ref 6.5–8.1)

## 2016-08-25 LAB — I-STAT TROPONIN, ED: TROPONIN I, POC: 0 ng/mL (ref 0.00–0.08)

## 2016-08-25 LAB — BRAIN NATRIURETIC PEPTIDE: B Natriuretic Peptide: 23.4 pg/mL (ref 0.0–100.0)

## 2016-08-25 MED ORDER — FUROSEMIDE 10 MG/ML IJ SOLN
40.0000 mg | Freq: Once | INTRAMUSCULAR | Status: AC
  Administered 2016-08-25: 40 mg via INTRAVENOUS
  Filled 2016-08-25: qty 4

## 2016-08-25 MED ORDER — ACETAMINOPHEN 325 MG PO TABS
650.0000 mg | ORAL_TABLET | Freq: Four times a day (QID) | ORAL | Status: DC | PRN
  Administered 2016-08-27: 650 mg via ORAL
  Filled 2016-08-25: qty 2

## 2016-08-25 MED ORDER — HEPARIN SODIUM (PORCINE) 5000 UNIT/ML IJ SOLN
5000.0000 [IU] | Freq: Three times a day (TID) | INTRAMUSCULAR | Status: DC
  Administered 2016-08-25 – 2016-08-27 (×7): 5000 [IU] via SUBCUTANEOUS
  Filled 2016-08-25 (×7): qty 1

## 2016-08-25 MED ORDER — SODIUM CHLORIDE 0.9% FLUSH: 3.0000 mL | INTRAVENOUS | Status: DC | PRN

## 2016-08-25 MED ORDER — ACETAMINOPHEN 650 MG RE SUPP: 650.0000 mg | Freq: Four times a day (QID) | RECTAL | Status: DC | PRN

## 2016-08-25 MED ORDER — ALBUTEROL SULFATE (2.5 MG/3ML) 0.083% IN NEBU
5.0000 mg | INHALATION_SOLUTION | Freq: Once | RESPIRATORY_TRACT | Status: AC
  Administered 2016-08-25: 5 mg via RESPIRATORY_TRACT
  Filled 2016-08-25: qty 6

## 2016-08-25 MED ORDER — IPRATROPIUM-ALBUTEROL 0.5-2.5 (3) MG/3ML IN SOLN
3.0000 mL | RESPIRATORY_TRACT | Status: DC
  Administered 2016-08-25 – 2016-08-28 (×14): 3 mL via RESPIRATORY_TRACT
  Filled 2016-08-25 (×14): qty 3

## 2016-08-25 MED ORDER — ALBUTEROL SULFATE (2.5 MG/3ML) 0.083% IN NEBU
INHALATION_SOLUTION | RESPIRATORY_TRACT | Status: AC
  Filled 2016-08-25: qty 6

## 2016-08-25 MED ORDER — POTASSIUM CHLORIDE CRYS ER 20 MEQ PO TBCR
40.0000 meq | EXTENDED_RELEASE_TABLET | Freq: Every day | ORAL | Status: DC
  Administered 2016-08-25: 40 meq via ORAL
  Filled 2016-08-25: qty 2

## 2016-08-25 MED ORDER — TRAMADOL HCL 50 MG PO TABS
50.0000 mg | ORAL_TABLET | Freq: Four times a day (QID) | ORAL | Status: DC | PRN
  Administered 2016-08-25 – 2016-08-28 (×6): 50 mg via ORAL
  Filled 2016-08-25 (×7): qty 1

## 2016-08-25 MED ORDER — INSULIN ASPART 100 UNIT/ML ~~LOC~~ SOLN
0.0000 [IU] | Freq: Three times a day (TID) | SUBCUTANEOUS | Status: DC
  Administered 2016-08-25: 2 [IU] via SUBCUTANEOUS
  Administered 2016-08-26: 9 [IU] via SUBCUTANEOUS
  Administered 2016-08-26: 5 [IU] via SUBCUTANEOUS

## 2016-08-25 MED ORDER — ALBUTEROL SULFATE (2.5 MG/3ML) 0.083% IN NEBU
5.0000 mg | INHALATION_SOLUTION | Freq: Once | RESPIRATORY_TRACT | Status: AC
  Administered 2016-08-25: 5 mg via RESPIRATORY_TRACT

## 2016-08-25 MED ORDER — SODIUM CHLORIDE 0.9% FLUSH
3.0000 mL | Freq: Two times a day (BID) | INTRAVENOUS | Status: DC
  Administered 2016-08-25 (×2): 3 mL via INTRAVENOUS

## 2016-08-25 MED ORDER — SODIUM CHLORIDE 0.9 % IV SOLN: 250.0000 mL | INTRAVENOUS | Status: DC | PRN

## 2016-08-25 MED ORDER — FLUTICASONE FUROATE-VILANTEROL 100-25 MCG/INH IN AEPB
1.0000 | INHALATION_SPRAY | Freq: Every day | RESPIRATORY_TRACT | Status: DC
  Administered 2016-08-25 – 2016-08-28 (×4): 1 via RESPIRATORY_TRACT
  Filled 2016-08-25: qty 28

## 2016-08-25 MED ORDER — ROFLUMILAST 500 MCG PO TABS
500.0000 ug | ORAL_TABLET | Freq: Every day | ORAL | Status: DC
  Administered 2016-08-25 – 2016-08-28 (×4): 500 ug via ORAL
  Filled 2016-08-25 (×4): qty 1

## 2016-08-25 MED ORDER — MAGNESIUM SULFATE 2 GM/50ML IV SOLN
2.0000 g | Freq: Once | INTRAVENOUS | Status: AC
  Administered 2016-08-25: 2 g via INTRAVENOUS
  Filled 2016-08-25: qty 50

## 2016-08-25 MED ORDER — IPRATROPIUM-ALBUTEROL 0.5-2.5 (3) MG/3ML IN SOLN: 3.0000 mL | Freq: Four times a day (QID) | RESPIRATORY_TRACT | Status: DC

## 2016-08-25 MED ORDER — METHYLPREDNISOLONE SODIUM SUCC 125 MG IJ SOLR
60.0000 mg | Freq: Every day | INTRAMUSCULAR | Status: DC
  Administered 2016-08-25 – 2016-08-27 (×3): 60 mg via INTRAVENOUS
  Filled 2016-08-25 (×3): qty 2

## 2016-08-25 MED ORDER — ALBUTEROL SULFATE (2.5 MG/3ML) 0.083% IN NEBU: 2.5000 mg | INHALATION_SOLUTION | RESPIRATORY_TRACT | Status: DC | PRN

## 2016-08-25 MED ORDER — TORSEMIDE 20 MG PO TABS
20.0000 mg | ORAL_TABLET | Freq: Every day | ORAL | Status: DC
  Administered 2016-08-26 – 2016-08-28 (×3): 20 mg via ORAL
  Filled 2016-08-25 (×3): qty 1

## 2016-08-25 MED ORDER — PANTOPRAZOLE SODIUM 40 MG PO TBEC
40.0000 mg | DELAYED_RELEASE_TABLET | Freq: Two times a day (BID) | ORAL | Status: DC
  Administered 2016-08-25 – 2016-08-28 (×6): 40 mg via ORAL
  Filled 2016-08-25 (×6): qty 1

## 2016-08-25 MED ORDER — IPRATROPIUM-ALBUTEROL 0.5-2.5 (3) MG/3ML IN SOLN
3.0000 mL | RESPIRATORY_TRACT | Status: DC
  Administered 2016-08-25 (×2): 3 mL via RESPIRATORY_TRACT
  Filled 2016-08-25 (×2): qty 3

## 2016-08-25 MED ORDER — TIOTROPIUM BROMIDE MONOHYDRATE 18 MCG IN CAPS
18.0000 ug | ORAL_CAPSULE | Freq: Every day | RESPIRATORY_TRACT | Status: DC
  Administered 2016-08-25 – 2016-08-28 (×4): 18 ug via RESPIRATORY_TRACT
  Filled 2016-08-25: qty 5

## 2016-08-25 MED ORDER — PREGABALIN 50 MG PO CAPS
50.0000 mg | ORAL_CAPSULE | Freq: Three times a day (TID) | ORAL | Status: DC
  Administered 2016-08-25 – 2016-08-28 (×9): 50 mg via ORAL
  Filled 2016-08-25 (×9): qty 1

## 2016-08-25 MED ORDER — INSULIN ASPART 100 UNIT/ML ~~LOC~~ SOLN
0.0000 [IU] | Freq: Every day | SUBCUTANEOUS | Status: DC
  Administered 2016-08-25: 5 [IU] via SUBCUTANEOUS

## 2016-08-25 MED ORDER — PROMETHAZINE HCL 25 MG PO TABS: 12.5000 mg | ORAL_TABLET | Freq: Four times a day (QID) | ORAL | Status: DC | PRN

## 2016-08-25 NOTE — ED Notes (Signed)
PA at bedside.

## 2016-08-25 NOTE — H&P (Signed)
History and Physical    Mary [name] KDT:[id number] DOB: 07/28/1976 DOA: 08/25/2016  PCP: Mauricio Po, FNP Patient coming from: home  Chief Complaint: SOB  HPI: Mary [name] is a 40 y.o. female with medical history significant of CHF, DVT/PE, hepatic cirrhosis, oxygen-dependent COPD/asthma patients with obstructive sleep apnea on BiPAP presenting w/ sob and worsening hypoxemia. Of note pt was admitted from  08/09/2016 to 08/13/2016 for treatment of acute on chronic respiratory failure secondary to COPD exacerbation. Patient states that after discharge she was in her normal state of health which is in a chronic O2 dependency of 3 L nasal cannula during the day and BiPAP at night. Patient endorses compliance with her home medical regimen including inhalers. Patient states that approximately 2 days ago she started becoming very short of breath. This required her to increase her home O2. Symptoms are worsened significantly with ambulation. Constant. Denies any productive cough, lower extremity swelling, weight change, fevers, chest pain, palpitations, neck stiffness, headache, rash. Patient states that overnight she woke up multiple times due to apnea. Patient states she knows that this happened because she woke up gasping for air. Patient states that home O2 saturations As low as 61% prior to admission. Associated with intermittent wheezing.    ED Course: Objective findings outlined below. Given multiple albuterol nebulizer treatments, Lasix 40 mg IV and magnesium with some improvement of symptoms. Found to be hypoxic with ambulation despite being on 3 L nasal cannula.  Review of Systems: As per HPI otherwise 10 point review of systems negative.   Ambulatory Status: Restricted due to poor lung compliance.  Past Medical History:  Diagnosis Date  . Axillary adenopathy    right axillary adenopathy noted on CT chest (03/06/2012)  . Chronic right-sided CHF (congestive heart failure)  ([place]) 03/23/2012   with cor pulmonale. Last RHC 03/2012  . COPD (chronic obstructive pulmonary disease) ([place])    PFTS 03/08/12: fev1 0.58L/1%, FVC 1.18/33%, Ratop 49 and c/w ssevere obstruction. 21% BD response on FVC, RV 219%, DLCO 11/54%  . DVT of upper extremity (deep vein thrombosis) [facility]) April 2013   right subclavian // Unclear precipitating cause - possibly significant right heart failure, with vascular stasis potentially predisposing to clotting.    . Exertional shortness of breath   . Hepatic cirrhosis ([place])    Questionable history of - Noted on CT abdomen (03/2012) - thought to be due to vascular congestion from right heart failure +/- patient's history of alcohol abuse  . History of alcoholism ([place])   . History of chronic bronchitis   . Irregular menses   . Migraine    "~ 1/yr" (05/03/2013)  . Moderate to severe pulmonary hypertension [facility]) April 2013   Cardiac cath on 03/06/12 - 1. Elevated pulmonary artery pressures, right sided filling pressures.,  2. PA: 64/45 (mean 53)    . On home oxygen therapy    "2-3 L 24/7" (05/03/2013)  . OSA (obstructive sleep apnea)    wears noctural BiPAP (05/03/2013)  . Periodontal disease   . Pneumonia ~ 1985; 01/2011  . Pulmonary embolus [facility]) April 2013   Precipitating cause unclear. Was treated with coumadin from April-June 2013.    Past Surgical History:  Procedure Laterality Date  . APPENDECTOMY  05/03/2013  . CARDIAC CATHETERIZATION  03/2012  . CARDIAC CATHETERIZATION N/A 01/08/2016   Procedure: Right Heart Cath;  Surgeon: Jolaine Artist, MD;  Location: [facility];  Service: Cardiovascular;  Laterality: N/A;  . CARDIAC SURGERY  16-Aug-1976   "my heart was backwards" (05/03/2013)  . LAPAROSCOPIC APPENDECTOMY N/A 05/03/2013   Procedure: APPENDECTOMY LAPAROSCOPIC;  Surgeon: Gwenyth Ober, MD;  Location: [facility];  Service: General;  Laterality: N/A;  . LUNG SURGERY  May 09, 1977  . RIGHT HEART CATHETERIZATION N/A 03/07/2012   Procedure: RIGHT  HEART CATH;  Surgeon: Burnell Blanks, MD;  Location: [facility];  Service: Cardiovascular;  Laterality: N/A;    Social History   Social History  . Marital status: Married    Spouse name: N/A  . Number of children: 0  . Years of education: 14   Occupational History  . Unemployed [employer]   Social History Main Topics  . Smoking status: Former Smoker    Packs/day: 1.00    Years: 25.00    Types: Cigarettes    Quit date: 02/18/2012  . Smokeless tobacco: Never Used  . Alcohol use 1.8 oz/week    3 Shots of liquor per week     Comment: previously drinking 1 pint 3-4 days a week for 2002-2013, but in 2014, occasional drinking now   . Drug use: No  . Sexual activity: Yes    Partners: Male   Other Topics Concern  . Not on file   Social History Narrative   Lives with husband in [place]   Has a farm with multiple animals including chickens, [place] pigs, cockatil (bird), dogs and cats   Completed GED and 2 years of college   Smoking-quit smoking >1 year since 2014   Not working    Fun: Health visitor with her dog and go fishing    No Known Allergies  Family History  Problem Relation Age of Onset  . Hypertension Father   . Heart disease Father     CHF; died age 63   . Alcohol abuse Father   . Other Brother     died age 9 y.o overdose    Prior to Admission medications   Medication Sig Start Date End Date Taking? Authorizing Provider  albuterol (PROVENTIL HFA;VENTOLIN HFA) 108 (90 BASE) MCG/ACT inhaler Inhale 2 puffs into the lungs every 6 (six) hours as needed for wheezing or shortness of breath. 09/06/14  Yes Brand Males, MD  blood glucose meter kit and supplies KIT Dispense based on patient and insurance preference. Use up to four times daily as directed. (FOR ICD-9 250.00, 250.01). 04/11/16  Yes Janece Canterbury, MD  BREO ELLIPTA 100-25 MCG/INH AEPB INHALE 1 PUFF INTO THE LUNGS DAILY. 06/03/16  Yes Brand Males, MD  pantoprazole  (PROTONIX) 40 MG tablet Take 1 tablet (40 mg total) by mouth 2 (two) times daily before a meal. 07/22/16  Yes Kelvin Cellar, MD  potassium chloride SA (K-DUR,KLOR-CON) 20 MEQ tablet Take 1 tablet (20 mEq total) by mouth daily. 01/29/16  Yes Jolaine Artist, MD  pregabalin (LYRICA) 50 MG capsule Take 1 capsule (50 mg total) by mouth 3 (three) times daily. 05/31/16  Yes Golden Circle, FNP  roflumilast (DALIRESP) 500 MCG TABS tablet Take 500 mcg by mouth daily.   Yes Historical Provider, MD  SPIRIVA RESPIMAT 1.25 MCG/ACT AERS INHALE 2 PUFFS INTO THE LUNGS 2 (TWO) TIMES DAILY. 08/16/16  Yes Brand Males, MD  torsemide (DEMADEX) 20 MG tablet Take 1 tablet (20 mg total) by mouth daily. 07/29/16  Yes Golden Circle, FNP  traMADol (ULTRAM) 50 MG tablet TAKE 1 TABLET BY MOUTH EVERY 6 HOURS AS NEEDED FOR SEVERE PAIN 08/19/16  Yes  Golden Circle, FNP    Physical Exam: Vitals:   08/25/16 1130 08/25/16 1200 08/25/16 1230 08/25/16 1300  BP: 116/77 (!) 128/115 112/84 124/80  Pulse: 102 101 102 94  Resp: _0 Temp:      SpO2: 91% 95% 94% (!) 89%  Weight:      Height:         General: No acute distress, resting in bed Eyes:   L eye w/ lateral fixation, normal lids, iris ENT:  grossly normal hearing, lips & tongue, mmm Neck:  no LAD, masses or thyromegaly Cardiovascular:  RRR, no m/r/g. No LE edema.  Respiratory: Diffuse wheezing in all lung fields, decreased air movement, increased effort, on 4 L nasal cannula Abdomen:  soft, ntnd, NABS Skin:  no rash or induration seen on limited exam Musculoskeletal:  grossly normal tone BUE/BLE, good ROM, no bony abnormality Psychiatric:  grossly normal mood and affect, speech fluent and appropriate, AOx3 Neurologic:  moves all extremities in coordinated fashion, sensation intact    Labs on Admission: I have personally reviewed following labs and imaging studies  CBC:  Recent Labs Lab 08/25/16 0713  WBC 10.7*  NEUTROABS 8.5*  HGB 13.8   HCT 44.9  MCV 102.3*  PLT 007   Basic Metabolic Panel:  Recent Labs Lab 08/25/16 0713  NA 138  K 3.4*  CL 93*  CO2 35*  GLUCOSE 191*  BUN 7  CREATININE 0.53  CALCIUM 9.5   GFR: Estimated Creatinine Clearance: 99 mL/min (by C-G formula based on SCr of 0.53 mg/dL). Liver Function Tests:  Recent Labs Lab 08/25/16 0713  AST 34  ALT 52  ALKPHOS 105  BILITOT 0.6  PROT 6.8  ALBUMIN 3.7   No results for input(s): LIPASE, AMYLASE in the last 168 hours. No results for input(s): AMMONIA in the last 168 hours. Coagulation Profile: No results for input(s): INR, PROTIME in the last 168 hours. Cardiac Enzymes: No results for input(s): CKTOTAL, CKMB, CKMBINDEX, TROPONINI in the last 168 hours. BNP (last 3 results)  Recent Labs  04/20/16 1703  PROBNP 30.0   HbA1C: No results for input(s): HGBA1C in the last 72 hours. CBG: No results for input(s): GLUCAP in the last 168 hours. Lipid Profile: No results for input(s): CHOL, HDL, LDLCALC, TRIG, CHOLHDL, LDLDIRECT in the last 72 hours. Thyroid Function Tests: No results for input(s): TSH, T4TOTAL, FREET4, T3FREE, THYROIDAB in the last 72 hours. Anemia Panel: No results for input(s): VITAMINB12, FOLATE, FERRITIN, TIBC, IRON, RETICCTPCT in the last 72 hours. Urine analysis:    Component Value Date/Time   COLORURINE YELLOW 07/25/2015 1400   APPEARANCEUR HAZY (A) 07/25/2015 1400   LABSPEC 1.020 07/25/2015 1400   PHURINE 7.0 07/25/2015 1400   GLUCOSEU NEGATIVE 07/25/2015 1400   HGBUR LARGE (A) 07/25/2015 1400   BILIRUBINUR NEGATIVE 07/25/2015 1400   KETONESUR NEGATIVE 07/25/2015 1400   PROTEINUR NEGATIVE 07/25/2015 1400   UROBILINOGEN 1.0 07/25/2015 1400   NITRITE NEGATIVE 07/25/2015 1400   LEUKOCYTESUR NEGATIVE 07/25/2015 1400    Creatinine Clearance: Estimated Creatinine Clearance: 99 mL/min (by C-G formula based on SCr of 0.53 mg/dL).  Sepsis Labs: _1 (procalcitonin:4,lacticidven:4) )No results found for  this or any previous visit (from the past 240 hour(s)).   Radiological Exams on Admission: Dg Chest 2 View  Result Date: 08/25/2016 CLINICAL DATA:  Shortness of breath this morning. Home oxygen. History of CHF and COPD EXAM: CHEST  2 VIEW COMPARISON:  08/11/2016 FINDINGS: Normal heart size and pulmonary vascularity.  Mediastinal contours appear intact. Mild prominence of pulmonary outflow tract. Slight interstitial pattern to the lungs suggesting mild edema. No progression since previous study. Increased opacity in the left lung base probably represents prominent cardiac fat pad. No pneumothorax. No focal consolidation. IMPRESSION: Mild prominence of pulmonary outflow tract. Mild interstitial changes suggesting mild edema. Electronically Signed   By: Lucienne Capers M.D.   On: 08/25/2016 05:56    EKG: Independently reviewed.   Assessment/Plan Active Problems:   OSA on BiPAP   Acute on chronic respiratory failure with hypoxia (HCC)   Esophageal reflux   COPD (chronic obstructive pulmonary disease) (HCC)   Chronic diastolic heart failure (HCC)   Diabetic neuropathy (HCC)    Acute on chronic respiratory failure: The patient with 3 L baseline O2 requirement at home and BiPAP at night. Patient reports some desaturations to 61% despite O2 supplementation. In the ED patient desaturated into the low 80s while on oxygen with ambulation. Suspect this is multifactorial including progression of advanced/end stage COPD, mild CHF exacerbation, and mild COPD exacerbation. Patient followed by Dr. Chase Caller of pulmonology - discussed case w/ him and recommends outpt f/u and palliative care consult to discuss [place] and possible hospice. - Duonebs Q4/Q2prn - Solumedrol 60 Qday - O2 PRN - ABX if condition worsens - BIPAP - Continue Daliresp, Breo, Spiriva - Palliative care consult - GOC/hospice. Discuss w/ Dr. Chase Caller for further direction.  - Procalcitonin  Chronic diastolic congestive heart failure:  Possible mild exacerbation given exam findings and CXR though BNP 23.4. Last Echo showing EF 28% grade 1 diastolic dysfunction. Lasix 40 IV given in ED.  - Daily weights, strict I's and O's - continue torsemide  GERD; - continue ppi  Chronic pain: - continue tramadol, lyrica  Diabetes: Anticipate hyperglycemia given steroid dosing - SSI   DVT prophylaxis: Hep  Code Status: full  Family Communication: none  Disposition Plan: pending improvement  Consults called: pulm - phone consult  Admission status: obs    MERRELL, DAVID J MD [facility]  If 7PM-7AM, please contact night-coverage [email or web address] Password [email or web address]  08/25/2016, 1:05 PM

## 2016-08-25 NOTE — ED Provider Notes (Signed)
[facility] DEPT Provider Note   CSN: [id number] Arrival date & time: 08/25/16  7989     History   Chief Complaint Chief Complaint  Patient presents with  . Shortness of Breath    HPI Mary [name] is a 40 y.o. female.  HPI Mary [name] is a 40 y.o. female with hx of COPD, CHF, pulmonary htn, dvt, Presents to emergency department complaining of shortness of breath. Patient states she has had several episodes overnight where she woke up gasping for air. Patient has home pulse ox machine, and states she measured her oxygen saturation was in the 60s. Patient admits to wearing BiPAP machine. She is on 2-3 L of oxygen at home at all times. Patient also reports worsening shortness of breath over last several days. She reports recent admission, states she was doing great up until the last few days. She is using all of her inhalers and does breathing treatment with relief, but states symptoms returned just few minutes after. Patient reports severe headache when waking up while gasping for air and states felt like her head was going to explode. She denies any chest pain. Denies abdominal pain. No significant increase in swelling in her extremities.  Past Medical History:  Diagnosis Date  . Axillary adenopathy    right axillary adenopathy noted on CT chest (03/06/2012)  . Chronic right-sided CHF (congestive heart failure) ([place]) 03/23/2012   with cor pulmonale. Last RHC 03/2012  . COPD (chronic obstructive pulmonary disease) ([place])    PFTS 03/08/12: fev1 0.58L/1%, FVC 1.18/33%, Ratop 49 and c/w ssevere obstruction. 21% BD response on FVC, RV 219%, DLCO 11/54%  . DVT of upper extremity (deep vein thrombosis) [facility]) April 2013   right subclavian // Unclear precipitating cause - possibly significant right heart failure, with vascular stasis potentially predisposing to clotting.    . Exertional shortness of breath   . Hepatic cirrhosis ([place])    Questionable history of - Noted on CT abdomen  (03/2012) - thought to be due to vascular congestion from right heart failure +/- patient's history of alcohol abuse  . History of alcoholism ([place])   . History of chronic bronchitis   . Irregular menses   . Migraine    "~ 1/yr" (05/03/2013)  . Moderate to severe pulmonary hypertension [facility]) April 2013   Cardiac cath on 03/06/12 - 1. Elevated pulmonary artery pressures, right sided filling pressures.,  2. PA: 64/45 (mean 53)    . On home oxygen therapy    "2-3 L 24/7" (05/03/2013)  . OSA (obstructive sleep apnea)    wears noctural BiPAP (05/03/2013)  . Periodontal disease   . Pneumonia ~ 1985; 01/2011  . Pulmonary embolus [facility]) April 2013   Precipitating cause unclear. Was treated with coumadin from April-June 2013.    Patient Active Problem List   Diagnosis Date Noted  . Chronic hoarseness 07/27/2016  . Nausea with vomiting 05/31/2016  . Diabetic neuropathy ([place]) 04/30/2016  . Diabetes mellitus type 2 in obese ([place]) 04/10/2016  . SOB (shortness of breath)   . OSA treated with BiPAP 04/02/2016  . COPD exacerbation ([place]) 04/01/2016  . Chronic diastolic heart failure ([place]) 01/25/2016  . Chronic respiratory failure with hypoxia ([place]) 01/21/2016  . Right heart failure ([place])   . PAH (pulmonary artery hypertension) ([place])   . COPD (chronic obstructive pulmonary disease) ([place]) 12/16/2015  . Constipation 11/18/2015  . Morbid obesity ([place]) 11/18/2015  . Lung nodule 01/30/2015  . Pulmonary mass 01/13/2015  . Other emphysema (  HCC)   . Gastroparesis   . Esophageal reflux   . Obstipation   . Dehydration 12/14/2014  . Abdominal pain 12/14/2014  . Fever 12/14/2014  . Dyspnea 09/18/2013  . Hypokalemia 02/28/2013  . Acute on chronic respiratory failure with hypoxia ([place]) 02/28/2013  . Financial difficulties 02/28/2013  . Obesity hypoventilation syndrome ([place]) 02/28/2013  . Elevated troponin 02/28/2013  . Myalgia 02/28/2013  . Influenza with respiratory manifestations 02/28/2013  .  Anovulation 06/30/2012  . Preventative health care 05/05/2012  . Irregular menstrual cycle 04/27/2012  . Chronic right-sided CHF (congestive heart failure) ([place]) 03/23/2012  . Pulmonary hypertension ([place]) 03/23/2012  . Cor pulmonale ([place]) 03/21/2012  . Chronic respiratory failure with hypercapnia ([place]) 03/21/2012  . OSA on BiPAP 03/19/2012  . Axillary lymphadenopathy 03/06/2012  . Congenital heart disease 01/28/2012  . Obesity 01/28/2012  . Dental caries 01/28/2012    Past Surgical History:  Procedure Laterality Date  . APPENDECTOMY  05/03/2013  . CARDIAC CATHETERIZATION  03/2012  . CARDIAC CATHETERIZATION N/A 01/08/2016   Procedure: Right Heart Cath;  Surgeon: Jolaine Artist, MD;  Location: [facility];  Service: Cardiovascular;  Laterality: N/A;  . CARDIAC SURGERY  1977/02/27   "my heart was backwards" (05/03/2013)  . LAPAROSCOPIC APPENDECTOMY N/A 05/03/2013   Procedure: APPENDECTOMY LAPAROSCOPIC;  Surgeon: Gwenyth Ober, MD;  Location: [facility];  Service: General;  Laterality: N/A;  . LUNG SURGERY  1977/07/19  . RIGHT HEART CATHETERIZATION N/A 03/07/2012   Procedure: RIGHT HEART CATH;  Surgeon: Burnell Blanks, MD;  Location: [facility] CATH LAB;  Service: Cardiovascular;  Laterality: N/A;    OB History    No data available       Home Medications    Prior to Admission medications   Medication Sig Start Date End Date Taking? Authorizing Provider  albuterol (PROVENTIL HFA;VENTOLIN HFA) 108 (90 BASE) MCG/ACT inhaler Inhale 2 puffs into the lungs every 6 (six) hours as needed for wheezing or shortness of breath. 09/06/14  Yes Brand Males, MD  blood glucose meter kit and supplies KIT Dispense based on patient and insurance preference. Use up to four times daily as directed. (FOR ICD-9 250.00, 250.01). 04/11/16  Yes Janece Canterbury, MD  BREO ELLIPTA 100-25 MCG/INH AEPB INHALE 1 PUFF INTO THE LUNGS DAILY. 06/03/16  Yes Brand Males, MD  pantoprazole (PROTONIX) 40 MG tablet  Take 1 tablet (40 mg total) by mouth 2 (two) times daily before a meal. 07/22/16  Yes Kelvin Cellar, MD  potassium chloride SA (K-DUR,KLOR-CON) 20 MEQ tablet Take 1 tablet (20 mEq total) by mouth daily. 01/29/16  Yes Jolaine Artist, MD  pregabalin (LYRICA) 50 MG capsule Take 1 capsule (50 mg total) by mouth 3 (three) times daily. 05/31/16  Yes Golden Circle, FNP  roflumilast (DALIRESP) 500 MCG TABS tablet Take 500 mcg by mouth daily.   Yes Historical Provider, MD  SPIRIVA RESPIMAT 1.25 MCG/ACT AERS INHALE 2 PUFFS INTO THE LUNGS 2 (TWO) TIMES DAILY. 08/16/16  Yes Brand Males, MD  torsemide (DEMADEX) 20 MG tablet Take 1 tablet (20 mg total) by mouth daily. 07/29/16  Yes Golden Circle, FNP  traMADol (ULTRAM) 50 MG tablet TAKE 1 TABLET BY MOUTH EVERY 6 HOURS AS NEEDED FOR SEVERE PAIN 08/19/16  Yes Golden Circle, FNP    Family History Family History  Problem Relation Age of Onset  . Hypertension Father   . Heart disease Father     CHF; died age 55   . Alcohol  abuse Father   . Other Brother     died age 58 y.o overdose    Social History Social History  Substance Use Topics  . Smoking status: Former Smoker    Packs/day: 1.00    Years: 25.00    Types: Cigarettes    Quit date: 02/18/2012  . Smokeless tobacco: Never Used  . Alcohol use 1.8 oz/week    3 Shots of liquor per week     Comment: previously drinking 1 pint 3-4 days a week for 2002-2013, but in 2014, occasional drinking now      Allergies   Review of patient's allergies indicates no known allergies.   Review of Systems Review of Systems  Constitutional: Negative for chills and fever.  Respiratory: Positive for cough, chest tightness and wheezing. Negative for shortness of breath.   Cardiovascular: Negative for chest pain, palpitations and leg swelling.  Gastrointestinal: Negative for abdominal pain, diarrhea, nausea and vomiting.  Genitourinary: Negative for dysuria, flank pain, pelvic pain, vaginal bleeding,  vaginal discharge and vaginal pain.  Musculoskeletal: Negative for arthralgias, myalgias, neck pain and neck stiffness.  Skin: Negative for rash.  Neurological: Negative for dizziness, weakness and headaches.  All other systems reviewed and are negative.    Physical Exam Updated Vital Signs BP 115/89   Pulse 91   Temp 98.4 F (36.9 C)   Resp 14   Ht 5' 2" (1.575 m)   Wt 89.4 kg   LMP 08/21/2016   SpO2 95%   BMI 36.03 kg/m   Physical Exam  Constitutional: She is oriented to person, place, and time. She appears well-developed and well-nourished. No distress.  HENT:  Head: Normocephalic.  Eyes: Conjunctivae are normal.  Neck: Neck supple.  Cardiovascular: Normal rate, regular rhythm and normal heart sounds.   Pulmonary/Chest: Effort normal. No respiratory distress. She has wheezes. She has no rales.  Decreased air movement in all fields bilaterally  Abdominal: Bowel sounds are normal. There is tenderness.  Musculoskeletal: She exhibits no edema.  Neurological: She is alert and oriented to person, place, and time.  Skin: Skin is warm and dry.  Psychiatric: She has a normal mood and affect. Her behavior is normal.  Nursing note and vitals reviewed.    ED Treatments / Results  Labs (all labs ordered are listed, but only abnormal results are displayed) Labs Reviewed  CBC WITH DIFFERENTIAL/PLATELET - Abnormal; Notable for the following:       Result Value   WBC 10.7 (*)    MCV 102.3 (*)    Neutro Abs 8.5 (*)    All other components within normal limits  COMPREHENSIVE METABOLIC PANEL - Abnormal; Notable for the following:    Potassium 3.4 (*)    Chloride 93 (*)    CO2 35 (*)    Glucose, Bld 191 (*)    All other components within normal limits  BRAIN NATRIURETIC PEPTIDE  I-STAT TROPOININ, ED    EKG  EKG Interpretation None       Radiology Dg Chest 2 View  Result Date: 08/25/2016 CLINICAL DATA:  Shortness of breath this morning. Home oxygen. History of CHF  and COPD EXAM: CHEST  2 VIEW COMPARISON:  08/11/2016 FINDINGS: Normal heart size and pulmonary vascularity. Mediastinal contours appear intact. Mild prominence of pulmonary outflow tract. Slight interstitial pattern to the lungs suggesting mild edema. No progression since previous study. Increased opacity in the left lung base probably represents prominent cardiac fat pad. No pneumothorax. No focal consolidation. IMPRESSION: Mild prominence  of pulmonary outflow tract. Mild interstitial changes suggesting mild edema. Electronically Signed   By: Lucienne Capers M.D.   On: 08/25/2016 05:56    Procedures Procedures (including critical care time)  Medications Ordered in ED Medications  albuterol (PROVENTIL) (2.5 MG/3ML) 0.083% nebulizer solution (not administered)  albuterol (PROVENTIL) (2.5 MG/3ML) 0.083% nebulizer solution 5 mg (5 mg Nebulization Given 08/25/16 0528)     Initial Impression / Assessment and Plan / ED Course  I have reviewed the triage vital signs and the nursing notes.  Pertinent labs & imaging results that were available during my care of the patient were reviewed by me and considered in my medical decision making (see chart for details).  Clinical Course   Pt with SOB from home. On 3L O2. Will check labs, CXR. Pt's oxygen sat in 90s at rest. She received a breathing tx. Will try few more treatments. Solumedrol ordered. Pt just finished steroid pack few days ago.   Pt received 3 breathing txs. No actue findings on labs on CXR. Pt ambulated on 3L, oxygen sat went down to mid 80s. Pt scared going home and going to bed. Spoke with [facility], will admit.   Vitals:   08/25/16 1230 08/25/16 1300 08/25/16 1400 08/25/16 1415  BP: 112/84 124/80 131/79   Pulse: 102 94  94  Resp: 11 22  (!) 29  Temp:      SpO2: 94% (!) 89%  96%  Weight:      Height:          Final Clinical Impressions(s) / ED Diagnoses   Final diagnoses:  COPD exacerbation [facility])    New Prescriptions New  Prescriptions   No medications on file     Jeannett Senior, PA-C 08/25/16 1530    Fatima Blank, MD 08/26/16 1115

## 2016-08-25 NOTE — ED Notes (Signed)
Pt oob to br with steady gait. States she has voided 500cc

## 2016-08-25 NOTE — ED Notes (Signed)
Pt oob to br ands request I relocate iv due to pain.

## 2016-08-25 NOTE — ED Triage Notes (Signed)
Pt states that she has a hx of COPD and thinks she is having a exacerbation. SOB has been going on for the past few days, pt states that she "stopped breathing twice tonight" states she woke up and checked her stats and it was 59% on 3.5L. Pt 90% on 3L

## 2016-08-25 NOTE — ED Notes (Signed)
Pt eating lunch and tolerating well. 

## 2016-08-25 NOTE — ED Notes (Signed)
Pt oob to br for 600 cc

## 2016-08-25 NOTE — ED Notes (Signed)
Pt's initial SpO2 was 94%. While ambulating Pt's SpO2 fluctuated between 90-92%. Pt reported "feeling like she couldn't take a breath, but she did not feel winded."

## 2016-08-25 NOTE — ED Notes (Signed)
Pt ambulated to bathroom, when returned to hospital bed and sitting upright Pt's SpO2 was 84%. It remained in the 80's for a minute. Pt's SpO2 has returned to 92%.

## 2016-08-26 DIAGNOSIS — Z86711 Personal history of pulmonary embolism: Secondary | ICD-10-CM | POA: Diagnosis not present

## 2016-08-26 DIAGNOSIS — J410 Simple chronic bronchitis: Secondary | ICD-10-CM | POA: Diagnosis not present

## 2016-08-26 DIAGNOSIS — R0600 Dyspnea, unspecified: Secondary | ICD-10-CM | POA: Diagnosis not present

## 2016-08-26 DIAGNOSIS — J9622 Acute and chronic respiratory failure with hypercapnia: Secondary | ICD-10-CM | POA: Diagnosis present

## 2016-08-26 DIAGNOSIS — J449 Chronic obstructive pulmonary disease, unspecified: Secondary | ICD-10-CM

## 2016-08-26 DIAGNOSIS — I272 Other secondary pulmonary hypertension: Secondary | ICD-10-CM

## 2016-08-26 DIAGNOSIS — R0602 Shortness of breath: Secondary | ICD-10-CM | POA: Diagnosis present

## 2016-08-26 DIAGNOSIS — G8929 Other chronic pain: Secondary | ICD-10-CM | POA: Diagnosis present

## 2016-08-26 DIAGNOSIS — J398 Other specified diseases of upper respiratory tract: Secondary | ICD-10-CM | POA: Diagnosis present

## 2016-08-26 DIAGNOSIS — Z7189 Other specified counseling: Secondary | ICD-10-CM | POA: Diagnosis not present

## 2016-08-26 DIAGNOSIS — G4733 Obstructive sleep apnea (adult) (pediatric): Secondary | ICD-10-CM | POA: Diagnosis present

## 2016-08-26 DIAGNOSIS — E1165 Type 2 diabetes mellitus with hyperglycemia: Secondary | ICD-10-CM | POA: Diagnosis present

## 2016-08-26 DIAGNOSIS — E1140 Type 2 diabetes mellitus with diabetic neuropathy, unspecified: Secondary | ICD-10-CM | POA: Diagnosis present

## 2016-08-26 DIAGNOSIS — K219 Gastro-esophageal reflux disease without esophagitis: Secondary | ICD-10-CM | POA: Diagnosis present

## 2016-08-26 DIAGNOSIS — F411 Generalized anxiety disorder: Secondary | ICD-10-CM

## 2016-08-26 DIAGNOSIS — Z6836 Body mass index (BMI) 36.0-36.9, adult: Secondary | ICD-10-CM | POA: Diagnosis not present

## 2016-08-26 DIAGNOSIS — K7460 Unspecified cirrhosis of liver: Secondary | ICD-10-CM | POA: Diagnosis present

## 2016-08-26 DIAGNOSIS — R490 Dysphonia: Secondary | ICD-10-CM | POA: Diagnosis present

## 2016-08-26 DIAGNOSIS — Z87891 Personal history of nicotine dependence: Secondary | ICD-10-CM | POA: Diagnosis not present

## 2016-08-26 DIAGNOSIS — J42 Unspecified chronic bronchitis: Secondary | ICD-10-CM | POA: Diagnosis not present

## 2016-08-26 DIAGNOSIS — J9621 Acute and chronic respiratory failure with hypoxia: Secondary | ICD-10-CM | POA: Diagnosis present

## 2016-08-26 DIAGNOSIS — I2781 Cor pulmonale (chronic): Secondary | ICD-10-CM | POA: Diagnosis present

## 2016-08-26 DIAGNOSIS — T380X5A Adverse effect of glucocorticoids and synthetic analogues, initial encounter: Secondary | ICD-10-CM | POA: Diagnosis present

## 2016-08-26 DIAGNOSIS — J441 Chronic obstructive pulmonary disease with (acute) exacerbation: Secondary | ICD-10-CM | POA: Diagnosis present

## 2016-08-26 DIAGNOSIS — Z789 Other specified health status: Secondary | ICD-10-CM

## 2016-08-26 DIAGNOSIS — Z515 Encounter for palliative care: Secondary | ICD-10-CM

## 2016-08-26 DIAGNOSIS — Z9981 Dependence on supplemental oxygen: Secondary | ICD-10-CM | POA: Diagnosis not present

## 2016-08-26 DIAGNOSIS — I5032 Chronic diastolic (congestive) heart failure: Secondary | ICD-10-CM | POA: Diagnosis present

## 2016-08-26 DIAGNOSIS — J309 Allergic rhinitis, unspecified: Secondary | ICD-10-CM | POA: Diagnosis present

## 2016-08-26 DIAGNOSIS — Z86718 Personal history of other venous thrombosis and embolism: Secondary | ICD-10-CM | POA: Diagnosis not present

## 2016-08-26 LAB — BASIC METABOLIC PANEL
ANION GAP: 12 (ref 5–15)
Anion gap: 10 (ref 5–15)
BUN: 6 mg/dL (ref 6–20)
BUN: 17 mg/dL (ref 6–20)
CALCIUM: 9.3 mg/dL (ref 8.9–10.3)
CALCIUM: 9.7 mg/dL (ref 8.9–10.3)
CHLORIDE: 89 mmol/L — AB (ref 101–111)
CO2: 31 mmol/L (ref 22–32)
CO2: 32 mmol/L (ref 22–32)
CREATININE: 0.59 mg/dL (ref 0.44–1.00)
Chloride: 92 mmol/L — ABNORMAL LOW (ref 101–111)
Creatinine, Ser: 0.98 mg/dL (ref 0.44–1.00)
GFR calc Af Amer: >60 mL/min (ref >60)
GFR calc Af Amer: >60 mL/min (ref >60)
GLUCOSE: 265 mg/dL — AB (ref 65–99)
GLUCOSE: 576 mg/dL — AB (ref 65–99)
POTASSIUM: 4.8 mmol/L (ref 3.5–5.1)
Potassium: 5 mmol/L (ref 3.5–5.1)
SODIUM: 133 mmol/L — AB (ref 135–145)
SODIUM: 133 mmol/L — AB (ref 135–145)

## 2016-08-26 LAB — GLUCOSE, CAPILLARY
GLUCOSE-CAPILLARY: 516 mg/dL — AB (ref 65–99)
Glucose-Capillary: 290 mg/dL — ABNORMAL HIGH (ref 65–99)
Glucose-Capillary: 364 mg/dL — ABNORMAL HIGH (ref 65–99)
Glucose-Capillary: >600 mg/dL (ref 65–99)
Glucose-Capillary: 494 mg/dL — ABNORMAL HIGH (ref 65–99)

## 2016-08-26 LAB — CBC
HCT: 42.4 % (ref 36.0–46.0)
Hemoglobin: 14 g/dL (ref 12.0–15.0)
MCH: 32.7 pg (ref 26.0–34.0)
MCHC: 33 g/dL (ref 30.0–36.0)
MCV: 99.1 fL (ref 78.0–100.0)
PLATELETS: 160 10*3/uL (ref 150–400)
RBC: 4.28 MIL/uL (ref 3.87–5.11)
RDW: 13.3 % (ref 11.5–15.5)
WBC: 11.3 10*3/uL — AB (ref 4.0–10.5)

## 2016-08-26 LAB — GLUCOSE, RANDOM: GLUCOSE: 566 mg/dL — AB (ref 65–99)

## 2016-08-26 MED ORDER — INSULIN ASPART 100 UNIT/ML ~~LOC~~ SOLN: 20.0000 [IU] | Freq: Once | SUBCUTANEOUS | Status: DC

## 2016-08-26 MED ORDER — ALPRAZOLAM 0.25 MG PO TABS
0.1250 mg | ORAL_TABLET | Freq: Three times a day (TID) | ORAL | Status: DC | PRN
  Administered 2016-08-26: 0.125 mg via ORAL
  Filled 2016-08-26 (×2): qty 1

## 2016-08-26 MED ORDER — INSULIN ASPART 100 UNIT/ML IV SOLN: 18.0000 [IU] | Freq: Once | INTRAVENOUS | Status: DC

## 2016-08-26 MED ORDER — FUROSEMIDE 10 MG/ML IJ SOLN
40.0000 mg | Freq: Once | INTRAMUSCULAR | Status: AC
Start: 2016-08-26 — End: 2016-08-26
  Administered 2016-08-26: 40 mg via INTRAVENOUS
  Filled 2016-08-26: qty 4

## 2016-08-26 MED ORDER — INSULIN ASPART 100 UNIT/ML ~~LOC~~ SOLN
10.0000 [IU] | Freq: Once | SUBCUTANEOUS | Status: AC
  Administered 2016-08-26: 10 [IU] via SUBCUTANEOUS

## 2016-08-26 MED ORDER — INSULIN ASPART 100 UNIT/ML ~~LOC~~ SOLN
0.0000 [IU] | Freq: Three times a day (TID) | SUBCUTANEOUS | Status: DC
  Administered 2016-08-27: 8 [IU] via SUBCUTANEOUS
  Administered 2016-08-27 (×2): 15 [IU] via SUBCUTANEOUS
  Administered 2016-08-28: 3 [IU] via SUBCUTANEOUS

## 2016-08-26 MED ORDER — INSULIN ASPART 100 UNIT/ML ~~LOC~~ SOLN: 0.0000 [IU] | Freq: Every day | SUBCUTANEOUS | Status: DC

## 2016-08-26 MED ORDER — INSULIN ASPART 100 UNIT/ML IV SOLN
15.0000 [IU] | Freq: Once | INTRAVENOUS | Status: AC
  Administered 2016-08-26: 15 [IU] via INTRAVENOUS

## 2016-08-26 MED ORDER — MONTELUKAST SODIUM 5 MG PO CHEW
5.0000 mg | CHEWABLE_TABLET | Freq: Every day | ORAL | Status: DC
  Administered 2016-08-26 – 2016-08-27 (×2): 5 mg via ORAL
  Filled 2016-08-26 (×2): qty 1

## 2016-08-26 MED ORDER — ALPRAZOLAM 0.25 MG PO TABS
0.1250 mg | ORAL_TABLET | ORAL | Status: AC
  Administered 2016-08-26: 0.125 mg via ORAL
  Filled 2016-08-26: qty 1

## 2016-08-26 NOTE — Progress Notes (Addendum)
PROGRESS NOTE                                                                                                                                                                                                             Patient Demographics:    Mary [name], is a 40 y.o. female, DOB - 09/17/1976, AOZ:[id number]  Admit date - 08/25/2016   Admitting Physician Ozella Rocksavid J Merrell, MD  Outpatient Primary MD for the patient is Jeanine Luzalone, Gregory, FNP  LOS - 0  Chief Complaint  Patient presents with  . Shortness of Breath       Brief Narrative    Mary HakimMichelle L [name] is a 40 y.o. female with medical history significant of CHF, DVT/PE, hepatic cirrhosis, advanced oxygen-dependent COPD/asthma who follows with Dr. Marchelle Gearingamaswamy locally and was also considered for lung transplant but declined due to BMI, patients with obstructive sleep apnea on BiPAP presenting w/ sob and worsening hypoxemia. She has now been admitted 4 times in the last 6 months for COPD exacerbation.  Patient states that after discharge she was in her normal state of health which is in a chronic O2 dependency of 3 L nasal cannula during the day and BiPAP at night. Patient endorses compliance with her home medical regimen including inhalers. Patient states that approximately 2 days ago she started becoming very short of breath. This required her to increase her home O2. Symptoms are worsened significantly with ambulation. Constant. Denies any productive cough, lower extremity swelling, weight change, fevers, chest pain, palpitations, neck stiffness, headache, rash. Patient states that overnight she woke up multiple times due to apnea. Patient states she knows that this happened because she woke up gasping for air. Patient states that home O2 saturations As low as 61% prior to admission. Associated with intermittent wheezing.   Subjective:    Mary PardonMichelle Anastos today has, No headache,  No chest pain, No abdominal pain - No Nausea, No new weakness tingling or numbness, She has a dry cough and exertional shortness of breath   Assessment  & Plan :     1.Acute on chronic hypoxic and hypercapnic respiratory failure in a patient with advanced COPD on 3 L nasal cannula oxygen, also obstructive sleep apnea requiring BiPAP at night. Patient has been referred to a tertiary care center for lung transplant but was turned down due to  BMI. Fourth admission for similar problem in less than 6 months.  At this time she is being treated for possible COPD exacerbation, possible tracheomalacia per last CT scan in April of this year, minimal wheezing ? Mostly emphysema, continue IV steroids, nebulizer treatments and oxygen for supportive care. Since she is requiring recurrent admissions and appears to have a complex pulmonary history I will involve pulmonary critical care as well as for further input. She just finished an antibiotic course will refrain from adding antibiotic unless pulmonary thinks it's relevant.   2. Chronic diastolic CHF with last EF in January of this year of 60%. Clinically appears compensated, no orthopnea, BNP is stable, continue home dose diuretic and monitor.  PCCM ordered new TTE.   3. GERD. On PPI.  4. Chronic pain. Continue tramadol and Lyrica.  5. OSA. BiPAP daily at bedtime.  6. Steroid-induced hyperglycemia. Currently on sliding scale.    CBG (last 3)   Recent Labs  08/25/16 1713 08/25/16 2105 08/26/16 0726  GLUCAP 161* 362* 290*    Lab Results  Component Value Date   HGBA1C 5.4 07/21/2016       Family Communication  : None  Code Status :  Full  Diet : Heart healthy  Disposition Plan  :  Home in 2-3 days  Consults  :  Pulmonary  Procedures  :  None so far  DVT Prophylaxis  :  Heparin   Lab Results  Component Value Date   PLT 160 08/26/2016    Inpatient Medications  Scheduled Meds: . fluticasone furoate-vilanterol  1 puff  Inhalation Daily  . heparin  5,000 Units Subcutaneous Q8H  . insulin aspart  0-5 Units Subcutaneous QHS  . insulin aspart  0-9 Units Subcutaneous TID WC  . ipratropium-albuterol  3 mL Nebulization Q4H  . methylPREDNISolone (SOLU-MEDROL) injection  60 mg Intravenous Daily  . pantoprazole  40 mg Oral BID AC  . pregabalin  50 mg Oral TID  . roflumilast  500 mcg Oral Daily  . tiotropium  18 mcg Inhalation Daily  . torsemide  20 mg Oral Daily   Continuous Infusions:  PRN Meds:.acetaminophen **OR** acetaminophen, albuterol, promethazine, traMADol  Antibiotics  :    Anti-infectives    None         Objective:   Vitals:   08/26/16 0442 08/26/16 0914 08/26/16 0916 08/26/16 0918  BP: 121/83     Pulse: 80     Resp: 17     Temp: 98.1 F (36.7 C)     TempSrc: Oral     SpO2: 93% 92% 94% 95%  Weight: 90.6 kg (199 lb 12.8 oz)     Height:        Wt Readings from Last 3 Encounters:  08/26/16 90.6 kg (199 lb 12.8 oz)  08/13/16 89.6 kg (197 lb 8.5 oz)  07/29/16 90.3 kg (199 lb)     Intake/Output Summary (Last 24 hours) at 08/26/16 1002 Last data filed at 08/26/16 0408  Gross per 24 hour  Intake              530 ml  Output             1300 ml  Net             -770 ml     Physical Exam  Awake Alert, Oriented X 3, No new F.N deficits, Normal affect [place].AT,PERRAL Supple Neck,No JVD, No cervical lymphadenopathy appriciated.  Symmetrical Chest wall movement, Good air movement bilaterally,  Few rales and minimal expiratory wheezes RRR,No Gallops,Rubs or new Murmurs, No Parasternal Heave +ve B.Sounds, Abd Soft, No tenderness, No organomegaly appriciated, No rebound - guarding or rigidity. No Cyanosis, Clubbing or edema, No new Rash or bruise       Data Review:    CBC  Recent Labs Lab 08/25/16 0713 08/26/16 0403  WBC 10.7* 11.3*  HGB 13.8 14.0  HCT 44.9 42.4  PLT 157 160  MCV 102.3* 99.1  MCH 31.4 32.7  MCHC 30.7 33.0  RDW 13.6 13.3  LYMPHSABS 1.2  --   MONOABS 0.7   --   EOSABS 0.2  --   BASOSABS 0.0  --     Chemistries   Recent Labs Lab 08/25/16 0713 08/26/16 0403  NA 138 133*  K 3.4* 5.0  CL 93* 92*  CO2 35* 31  GLUCOSE 191* 265*  BUN 7 6  CREATININE 0.53 0.59  CALCIUM 9.5 9.3  AST 34  --   ALT 52  --   ALKPHOS 105  --   BILITOT 0.6  --    ------------------------------------------------------------------------------------------------------------------ No results for input(s): CHOL, HDL, LDLCALC, TRIG, CHOLHDL, LDLDIRECT in the last 72 hours.  Lab Results  Component Value Date   HGBA1C 5.4 07/21/2016   ------------------------------------------------------------------------------------------------------------------ No results for input(s): TSH, T4TOTAL, T3FREE, THYROIDAB in the last 72 hours.  Invalid input(s): FREET3 ------------------------------------------------------------------------------------------------------------------ No results for input(s): VITAMINB12, FOLATE, FERRITIN, TIBC, IRON, RETICCTPCT in the last 72 hours.  Coagulation profile No results for input(s): INR, PROTIME in the last 168 hours.  No results for input(s): DDIMER in the last 72 hours.  Cardiac Enzymes No results for input(s): CKMB, TROPONINI, MYOGLOBIN in the last 168 hours.  Invalid input(s): CK ------------------------------------------------------------------------------------------------------------------    Component Value Date/Time   BNP 23.4 08/25/2016 0713    Micro Results No results found for this or any previous visit (from the past 240 hour(s)).  Radiology Reports Dg Chest 2 View  Result Date: 08/25/2016 CLINICAL DATA:  Shortness of breath this morning. Home oxygen. History of CHF and COPD EXAM: CHEST  2 VIEW COMPARISON:  08/11/2016 FINDINGS: Normal heart size and pulmonary vascularity. Mediastinal contours appear intact. Mild prominence of pulmonary outflow tract. Slight interstitial pattern to the lungs suggesting mild edema.  No progression since previous study. Increased opacity in the left lung base probably represents prominent cardiac fat pad. No pneumothorax. No focal consolidation. IMPRESSION: Mild prominence of pulmonary outflow tract. Mild interstitial changes suggesting mild edema. Electronically Signed   By: Burman Nieves M.D.   On: 08/25/2016 05:56   Dg Chest 2 View  Result Date: 08/11/2016 CLINICAL DATA:  Four days of shortness of breath. History of COPD, pulmonary embolism, pulmonary hypertension, chronic CHF, former smoker. EXAM: CHEST  2 VIEW COMPARISON:  Portable chest x-ray of August 09, 2016 and PA and lateral chest x-ray of July 18, 2016. FINDINGS: The lungs are mildly hyperinflated. The interstitial markings are coarse in the lower lung zones but appear fairly stable. There is no alveolar infiltrate or pleural effusion. There is no pneumothorax. The heart is top-normal in size. The pulmonary vascularity is mildly prominent centrally and more conspicuous than on the previous study. The mediastinum is normal in width. The bony thorax exhibits no acute abnormality. IMPRESSION: COPD. Low-grade pulmonary interstitial edema. No alveolar pneumonia nor pleural effusion. Electronically Signed   By: David  [place] M.D.   On: 08/11/2016 08:08   Dg Chest Port 1 View  Result Date: 08/09/2016 CLINICAL DATA:  COPD exacerbation  EXAM: PORTABLE CHEST 1 VIEW COMPARISON:  07/18/2016 FINDINGS: Lungs are clear.  No pleural effusion or pneumothorax. The heart is top-normal in size. IMPRESSION: No evidence of acute cardiopulmonary disease. Electronically Signed   By: Charline Bills M.D.   On: 08/09/2016 09:42    Time Spent in minutes  30   Janyth Riera K M.D on 08/26/2016 at 10:02 AM  Between 7am to 7pm - Pager - [phone]  After 7pm go to [email or web address] - password [facility]  [facility] -  Office  [phone]

## 2016-08-26 NOTE — Consult Note (Signed)
Name: Mary [name] MRN: [id number] DOB: 08/20/1976    ADMISSION DATE:  08/25/2016 CONSULTATION DATE:  9/21  REFERRING MD :  Candiss Norse ([facility])   CHIEF COMPLAINT:  AECOPD, frequent admissions   BRIEF PATIENT DESCRIPTION: 40yo former smoker with multiple medical problems including chronic respiratory failure on 3L home O2 in setting severe COPD, pulmonary HTN, CHF, OSA/OHS on qhs bipap as well as cirrhosis and hx DVT/PE.  She had recent admissions 8/13-8/17 and 9/4-9/8 for acute on chronic respiratory failure r/t AECOPD. She returned 9/20 with 2 day hx increasing SOB with increased O2 needs at home.  Admitted by hospitalist with AECOPD and acute on chronic respiratory failure.  PCCM consulted to assist r/t multiple recent admissions.   SIGNIFICANT EVENTS    STUDIES:  PFTS 03/08/12: fev1 0.58L/1%, FVC 1.18/33%, Ratop 49 and c/w severe obstruction. 21% BD response on FVC, RV 219%, DLCO 11/54%  PFT 04/10/12: fev1 0.54L/18%, 31% BD response, RATio 49, TL 88%, RV 204$, DLCO 30%,  Abg baseline MAy 2013: 7.43/50/81 CTA Chest 12/16/15 >neg PE, stable scarring in lingular/LUL.    HISTORY OF PRESENT ILLNESS:  40yo former smoker with multiple medical problems including chronic respiratory failure on 3L home O2 in setting severe COPD, pulmonary HTN, CHF, OSA/OHS on qhs bipap as well as cirrhosis and hx DVT/PE.  She had recent admissions 8/13-8/17 and 9/4-9/8 for acute on chronic respiratory failure r/t AECOPD. She returned 9/20 with 2 day hx increasing SOB with increased O2 needs at home.  Admitted by hospitalist with AECOPD and acute on chronic respiratory failure.  PCCM consulted to assist r/t multiple recent admissions.   Currently feels more SOB than baseline, but "not too bad at rest".  Feels her dyspnea is much worse with exertion.  Denies chest pain, BLE edema, purulent sputum, fever, n/v/d.  States she is compliant with all her meds.  Not smoking.   PAST MEDICAL HISTORY :  Past Medical History:    Diagnosis Date  . Axillary adenopathy    right axillary adenopathy noted on CT chest (03/06/2012)  . Chronic right-sided CHF (congestive heart failure) ([place]) 03/23/2012   with cor pulmonale. Last RHC 03/2012  . COPD (chronic obstructive pulmonary disease) ([place])    PFTS 03/08/12: fev1 0.58L/1%, FVC 1.18/33%, Ratop 49 and c/w ssevere obstruction. 21% BD response on FVC, RV 219%, DLCO 11/54%  . DVT of upper extremity (deep vein thrombosis) [facility]) April 2013   right subclavian // Unclear precipitating cause - possibly significant right heart failure, with vascular stasis potentially predisposing to clotting.    . Exertional shortness of breath   . Hepatic cirrhosis ([place])    Questionable history of - Noted on CT abdomen (03/2012) - thought to be due to vascular congestion from right heart failure +/- patient's history of alcohol abuse  . History of alcoholism ([place])   . History of chronic bronchitis   . Irregular menses   . Migraine    "~ 1/yr" (05/03/2013)  . Moderate to severe pulmonary hypertension [facility]) April 2013   Cardiac cath on 03/06/12 - 1. Elevated pulmonary artery pressures, right sided filling pressures.,  2. PA: 64/45 (mean 53)    . On home oxygen therapy    "2-3 L 24/7" (05/03/2013)  . OSA (obstructive sleep apnea)    wears noctural BiPAP (05/03/2013)  . Periodontal disease   . Pneumonia ~ 1985; 01/2011  . Pulmonary embolus [facility]) April 2013   Precipitating cause unclear. Was treated with coumadin from April-June 2013.  PAST SURGICAL HISTORY: Past Surgical History:  Procedure Laterality Date  . APPENDECTOMY  05/03/2013  . CARDIAC CATHETERIZATION  03/2012  . CARDIAC CATHETERIZATION N/A 01/08/2016   Procedure: Right Heart Cath;  Surgeon: Jolaine Artist, MD;  Location: [facility];  Service: Cardiovascular;  Laterality: N/A;  . CARDIAC SURGERY  [birth year]Jan-1976   "my heart was backwards" (05/03/2013)  . LAPAROSCOPIC APPENDECTOMY N/A 05/03/2013   Procedure: APPENDECTOMY LAPAROSCOPIC;   Surgeon: Gwenyth Ober, MD;  Location: [place];  Service: General;  Laterality: N/A;  . LUNG SURGERY  [birth year]Jul[birth year]1977  . RIGHT HEART CATHETERIZATION N/A 03/07/2012   Procedure: RIGHT HEART CATH;  Surgeon: Burnell Blanks, MD;  Location: [facility];  Service: Cardiovascular;  Laterality: N/A;    Prior to Admission medications   Medication Sig Start Date End Date Taking? Authorizing Provider  albuterol (PROVENTIL HFA;VENTOLIN HFA) 108 (90 BASE) MCG/ACT inhaler Inhale 2 puffs into the lungs every 6 (six) hours as needed for wheezing or shortness of breath. 09/06/14  Yes Brand Males, MD  blood glucose meter kit and supplies KIT Dispense based on patient and insurance preference. Use up to four times daily as directed. (FOR ICD-9 250.00, 250.01). 04/11/16  Yes Janece Canterbury, MD  BREO ELLIPTA 100-25 MCG/INH AEPB INHALE 1 PUFF INTO THE LUNGS DAILY. 06/03/16  Yes Brand Males, MD  pantoprazole (PROTONIX) 40 MG tablet Take 1 tablet (40 mg total) by mouth 2 (two) times daily before a meal. 07/22/16  Yes Kelvin Cellar, MD  potassium chloride SA (K-DUR,KLOR-CON) 20 MEQ tablet Take 1 tablet (20 mEq total) by mouth daily. 01/29/16  Yes Jolaine Artist, MD  pregabalin (LYRICA) 50 MG capsule Take 1 capsule (50 mg total) by mouth 3 (three) times daily. 05/31/16  Yes Golden Circle, FNP  roflumilast (DALIRESP) 500 MCG TABS tablet Take 500 mcg by mouth daily.   Yes Historical Provider, MD  SPIRIVA RESPIMAT 1.25 MCG/ACT AERS INHALE 2 PUFFS INTO THE LUNGS 2 (TWO) TIMES DAILY. 08/16/16  Yes Brand Males, MD  torsemide (DEMADEX) 20 MG tablet Take 1 tablet (20 mg total) by mouth daily. 07/29/16  Yes Golden Circle, FNP  traMADol (ULTRAM) 50 MG tablet TAKE 1 TABLET BY MOUTH EVERY 6 HOURS AS NEEDED FOR SEVERE PAIN 08/19/16  Yes Golden Circle, FNP   No Known Allergies  FAMILY HISTORY:  Family History  Problem Relation Age of Onset  . Hypertension Father   . Heart disease Father     CHF; died age 17     . Alcohol abuse Father   . Other Brother     died age 42 y.o overdose   SOCIAL HISTORY: Social History   Social History  . Marital status: Married    Spouse name: N/A  . Number of children: 0  . Years of education: 14   Occupational History  . Unemployed [employer]   Social History Main Topics  . Smoking status: Former Smoker    Packs/day: 1.00    Years: 25.00    Types: Cigarettes    Quit date: 02/18/2012  . Smokeless tobacco: Never Used  . Alcohol use 1.8 oz/week    3 Shots of liquor per week     Comment: previously drinking 1 pint 3-4 days a week for 2002-2013, but in 2014, occasional drinking now   . Drug use: No  . Sexual activity: Yes    Partners: Male   Other Topics Concern  . None  Social History Narrative   Lives with husband in [place]   Has a farm with multiple animals including chickens, [place] pigs, cockatil (bird), dogs and cats   Completed GED and 2 years of college   Smoking-quit smoking >1 year since 2014   Not working    Fun: Health visitor with her dog and go fishing    REVIEW OF SYSTEMS:  A pertinent 14 point review of systems is negative except as per the history of presenting illness.  SUBJECTIVE: As above.  VITAL SIGNS: Temp:  [97.7 F (36.5 C)-98.3 F (36.8 C)] 98.1 F (36.7 C) (09/21 0442) Pulse Rate:  [80-102] 80 (09/21 0442) Resp:  [11-29] 17 (09/21 0442) BP: (105-132)/(73-115) 121/83 (09/21 0442) SpO2:  [89 %-100 %] 95 % (09/21 0918) Weight:  [89.8 kg (198 lb)-90.6 kg (199 lb 12.8 oz)] 90.6 kg (199 lb 12.8 oz) (09/21 0442)  PHYSICAL EXAMINATION: General:  Pleasant but anxious female, NAD on [place]  Neuro:  Awake, alert, appropriate, MAE  HEENT:  Mm moist, no JVD  Cardiovascular:  s1s2 rrr trace edema Lungs:  resps even non labored at rest on 4L [place], diminished throughout, few exp wheezes  Abdomen:  Round, soft, non tender  Musculoskeletal:  Warm and dry, scant BLE edema  Lymphatics:  No appreciated  cervical or supraclavicular LAD. Psychiatric:  A&Ox4. Mood and affect congruent. Appropriately concerned.   Recent Labs Lab 08/25/16 0713 08/26/16 0403  NA 138 133*  K 3.4* 5.0  CL 93* 92*  CO2 35* 31  BUN 7 6  CREATININE 0.53 0.59  GLUCOSE 191* 265*    Recent Labs Lab 08/25/16 0713 08/26/16 0403  HGB 13.8 14.0  HCT 44.9 42.4  WBC 10.7* 11.3*  PLT 157 160   Dg Chest 2 View  Result Date: 08/25/2016 CLINICAL DATA:  Shortness of breath this morning. Home oxygen. History of CHF and COPD EXAM: CHEST  2 VIEW COMPARISON:  08/11/2016 FINDINGS: Normal heart size and pulmonary vascularity. Mediastinal contours appear intact. Mild prominence of pulmonary outflow tract. Slight interstitial pattern to the lungs suggesting mild edema. No progression since previous study. Increased opacity in the left lung base probably represents prominent cardiac fat pad. No pneumothorax. No focal consolidation. IMPRESSION: Mild prominence of pulmonary outflow tract. Mild interstitial changes suggesting mild edema. Electronically Signed   By: Lucienne Capers M.D.   On: 08/25/2016 05:56    ASSESSMENT / PLAN:  Acute on chronic respiratory failure - multifactorial r/t acute exacerbation of severe COPD with mild decompensated heart failure and probably significant deconditioning.  Seems her breathing gets worse as she tapers her steroids.  Mild edema on CXR. Last echo in January 2017 with ef 55-60%, PASP 74mHg.     REC -  -Supplemental O2 - wean as able to keep sats >88% - home 2-3L  -Continue IV steroids for now - hopefully in am can change to PO steroids with very slowly taper to 155mday and continue until seen back in pulmonary office  -Continue home torsemide  -Repeat lasix x1 now  -Repeat echo now   -Continue breo, spiriva, roflumilast  -PT/OT eval    KaNickolas MadridNP 08/26/2016  11:06 AM Pager: [phone] or [phone] Attending Note: Patient seen and examined with nurse  practitioner. Please refer to her consultation note which I have reviewed in detail. Patient reports increased dyspnea over 9 clear. Time but did awake with sudden onset of shortness of breath overnight last night. Denies any subjective fever, chills,  or sweats. She has experienced increasing sinus congestion and drainage over the last week or so as well. Denies any wheezing or cough. Denies any chest pain or pressure at this time. Patient was near syncopal with ambulation. No recent sick contacts.  BP 121/83 (BP Location: Left Arm)   Pulse 80   Temp 98.1 F (36.7 C) (Oral)   Resp 17   Ht _0  (1.575 m)   Wt 199 lb 12.8 oz (90.6 kg)   LMP 08/21/2016   SpO2 95%   BMI 36.54 kg/m  General:  Awake. Alert. Laying in bed watching TV.  Integument:  Warm & dry. No rash on exposed skin. No bruising. HEENT:  Moist mucus membranes. No oral ulcers. No scleral icterus.  Cardiovascular:  Regular rate & rhythm. Trace edema. No appreciable JVD.  Pulmonary:  Clear bilaterally with good aeration. Normal work of breathing on nasal cannula oxygen. Speaking in complete sentences. Abdomen: Soft. Normal bowel sounds. Protuberant. Grossly nontender. Musculoskeletal:  Normal bulk and tone. No joint deformity or effusion appreciated.  CXR PA/LAT 9/20:  Personally reviewed by me. Pulmonary pulmonary arteries bilaterally. No focal opacity appreciated. no pleural effusion.  A/P: 40 year old female with history of multiple medical problems including chronic hypoxic respiratory failure, very severe COPD based on 2013 spirometry, and pulmonary hypertension. Patient also has known underlying OSA/OHS and is on BiPAP at night. Patient's dyspnea seems to have been progressively worsening and culminated in her acute admission. She does have an oxygen requirement above her baseline. She does have some trace edema and her baseline weight is 2-3 pounds above what it normally is at home. However, I do question whether or not there  may be some element of reactive airways disease with his repeat admission after steroid taper was complete. She is unable to tell me whether or not her dyspnea began before or after the taper ended. This time she demonstrates no element of hemodynamic instability or physical exam findings that would lead me to believe that the patient has an acute pulmonary embolus/DVT as a cause for her decompensation nor does the clinical history support this.  1. Acute on chronic hypoxic respiratory failure: Continue to wean FiO2 for saturation greater than 92% to prevent V/Q mismatching and pulmonary arterial vasoconstriction. Continuing home Demadex & administering Lasix IV 1. TTE pending. 2. Very severe COPD: No obvious signs of acute exacerbation. Continuing Daliresp, Spiriva, & Breo. Also continuing to nebs every 4 hours. Recommend a gradual taper of steroids over the next 4 weeks but will reassess in the morning after echocardiogram has been complete. 3. Probable allergic rhinitis/possible reactive airway disease: Starting Singulair 5 mg by mouth daily at bedtime. 4. Pulmonary Hypertension:  Continuing Torsemide. Checking TTE. Close monitoring of I/O. 5. OSA/OHS: Continuing BiPAP at home settings. We will need to obtain a download from her machine to insure its functioning appropriately.  Sonia Baller Ashok Cordia, M.D. [facility] Pager:  [phone] After 3pm or if no response, call [phone] 12:32 PM 08/26/16

## 2016-08-26 NOTE — Progress Notes (Signed)
RT placed patient on BIPAP HS. 14/7 and 3L O2 bled in. Patient tolerating well.

## 2016-08-26 NOTE — Progress Notes (Signed)
[facility]  Patient Status: Active (receiving services up to time of hospitalization)  [facility] is providing the following services: RN  If patient discharges after hours, please call [phone].   Kizzie FurnishDonna Fellmy 08/26/2016, 8:36 AM

## 2016-08-26 NOTE — Progress Notes (Signed)
Repeat CBG greater than 600, STAT BMP ordered, MD notified.  Raymon MuttonGwen Aliyah Abeyta RN

## 2016-08-26 NOTE — Progress Notes (Signed)
Pt CBG 516, Dr Thedore MinsSingh notified with new orders received and implemented.  Raymon MuttonGwen Dreyton Roessner RN

## 2016-08-26 NOTE — Progress Notes (Signed)
Nutrition Brief Note  Received consult from the COPD Gold Protocol.  Wt Readings from Last 15 Encounters:  08/26/16 199 lb 12.8 oz (90.6 kg)  08/13/16 197 lb 8.5 oz (89.6 kg)  07/29/16 199 lb (90.3 kg)  07/27/16 198 lb (89.8 kg)  07/22/16 199 lb 6.4 oz (90.4 kg)  05/31/16 193 lb (87.5 kg)  04/30/16 194 lb (88 kg)  04/20/16 196 lb (88.9 kg)  04/11/16 190 lb 0.6 oz (86.2 kg)  01/22/16 202 lb 8 oz (91.9 kg)  01/21/16 200 lb 3.2 oz (90.8 kg)  01/09/16 202 lb 1.6 oz (91.7 kg)  12/23/15 204 lb (92.5 kg)  12/19/15 204 lb 1.6 oz (92.6 kg)  11/14/15 205 lb 0.4 oz (93 kg)    Body mass index is 36.54 kg/m. Patient meets criteria for class 2 obesity based on current BMI.   No significant weight loss noted.  Current diet order is heart healthy, patient is consuming 100% of meals at this time. Labs and medications reviewed.   No nutrition interventions warranted at this time. If nutrition issues arise, please consult RD.   Joaquin CourtsKimberly Harris, RD, LDN, CNSC Pager [phone] After Hours Pager [phone]

## 2016-08-26 NOTE — Progress Notes (Signed)
   Pt spoke to husband and palliative consult and explained prognosis to him, she stated he is very supportive, very concerned for him and what will happen to him without her, pt seemed very open to accepting palliative care post discharge stating she needs help knowing how to handle this situation, still feels like she cant believe this is happening.  Raymon MuttonGwen Oksana Deberry RN

## 2016-08-26 NOTE — Progress Notes (Signed)
Inpatient Diabetes Program Recommendations  AACE/ADA: New Consensus Statement on Inpatient Glycemic Control (2015)  Target Ranges:  Prepandial:   less than 140 mg/dL      Peak postprandial:   less than 180 mg/dL (1-2 hours)      Critically ill patients:  140 - 180 mg/dL   Lab Results  Component Value Date   GLUCAP 364 (H) 08/26/2016   HGBA1C 5.4 07/21/2016    Review of Glycemic Control Results for Mary HakimKELLEY, Marcellene L (MRN [id number]) as of 08/26/2016 14:39  Ref. Range 08/25/2016 17:13 08/25/2016 21:05 08/26/2016 07:26 08/26/2016 11:21  Glucose-Capillary Latest Ref Range: 65 - 99 mg/dL 409161 (H) 811362 (H) 914290 (H) 364 (H)   Diabetes history: DM2 Outpatient Diabetes medications: None Current orders for Inpatient glycemic control: Novolog correction 0-9 tid + 0-5 hs  Inpatient Diabetes Program Recommendations:  Please consider increase in Novolog correction to resistant 0-20 units tid + 0-5 hs while on steroids.  Thank you, Billy FischerJudy E. Nehal Shives, RN, MSN, CDE Inpatient Glycemic Control Team Team Pager [phone] (8am-5pm) 08/26/2016 2:44 PM
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
## 2016-08-27 ENCOUNTER — Ambulatory Visit (HOSPITAL_COMMUNITY): Payer: 59

## 2016-08-27 DIAGNOSIS — J9621 Acute and chronic respiratory failure with hypoxia: Secondary | ICD-10-CM

## 2016-08-27 DIAGNOSIS — R0600 Dyspnea, unspecified: Secondary | ICD-10-CM

## 2016-08-27 DIAGNOSIS — J441 Chronic obstructive pulmonary disease with (acute) exacerbation: Secondary | ICD-10-CM

## 2016-08-27 DIAGNOSIS — G4733 Obstructive sleep apnea (adult) (pediatric): Secondary | ICD-10-CM

## 2016-08-27 DIAGNOSIS — J4489 Other specified chronic obstructive pulmonary disease: Secondary | ICD-10-CM

## 2016-08-27 DIAGNOSIS — J9622 Acute and chronic respiratory failure with hypercapnia: Secondary | ICD-10-CM

## 2016-08-27 DIAGNOSIS — J449 Chronic obstructive pulmonary disease, unspecified: Secondary | ICD-10-CM

## 2016-08-27 DIAGNOSIS — J398 Other specified diseases of upper respiratory tract: Secondary | ICD-10-CM

## 2016-08-27 LAB — ECHOCARDIOGRAM COMPLETE
E decel time: 264 msec
E/e' ratio: 10
FS: 29 % (ref 28–44)
Height: 62 in
IVS/LV PW RATIO, ED: 0.96
LA vol A4C: 42.2 ml
LADIAMINDEX: 2.04 cm/m2
LASIZE: 39 mm
LAVOL: 47.4 mL
LAVOLIN: 24.8 mL/m2
LEFT ATRIUM END SYS DIAM: 39 mm
LV E/e' medial: 10
LV PW d: 9.42 mm — AB (ref 0.6–1.1)
LV TDI E'MEDIAL: 7.94
LVEEAVG: 10
LVELAT: 11.3 cm/s
Lateral S' vel: 14.1 cm/s
MV Dec: 264
MV pk A vel: 120 m/s
MV pk E vel: 113 m/s
MVPG: 5 mmHg
RV TAPSE: 21.2 mm
TDI e' lateral: 11.3
Weight: 3177.6 oz

## 2016-08-27 LAB — BASIC METABOLIC PANEL
Anion gap: 10 (ref 5–15)
BUN: 15 mg/dL (ref 6–20)
CALCIUM: 9.7 mg/dL (ref 8.9–10.3)
CO2: 36 mmol/L — ABNORMAL HIGH (ref 22–32)
CREATININE: 0.58 mg/dL (ref 0.44–1.00)
Chloride: 87 mmol/L — ABNORMAL LOW (ref 101–111)
GFR calc Af Amer: >60 mL/min (ref >60)
Glucose, Bld: 359 mg/dL — ABNORMAL HIGH (ref 65–99)
POTASSIUM: 4.3 mmol/L (ref 3.5–5.1)
SODIUM: 133 mmol/L — AB (ref 135–145)

## 2016-08-27 LAB — GLUCOSE, CAPILLARY
GLUCOSE-CAPILLARY: 335 mg/dL — AB (ref 65–99)
GLUCOSE-CAPILLARY: 406 mg/dL — AB (ref 65–99)
Glucose-Capillary: 282 mg/dL — ABNORMAL HIGH (ref 65–99)
Glucose-Capillary: 383 mg/dL — ABNORMAL HIGH (ref 65–99)
Glucose-Capillary: 434 mg/dL — ABNORMAL HIGH (ref 65–99)

## 2016-08-27 LAB — HEMOGLOBIN A1C
HEMOGLOBIN A1C: 7.1 % — AB (ref 4.8–5.6)
MEAN PLASMA GLUCOSE: 157 mg/dL

## 2016-08-27 LAB — GLUCOSE, RANDOM: GLUCOSE: 357 mg/dL — AB (ref 65–99)

## 2016-08-27 MED ORDER — DOXYCYCLINE HYCLATE 100 MG PO TABS
100.0000 mg | ORAL_TABLET | Freq: Two times a day (BID) | ORAL | Status: DC
  Administered 2016-08-27 – 2016-08-28 (×3): 100 mg via ORAL
  Filled 2016-08-27 (×3): qty 1

## 2016-08-27 MED ORDER — INSULIN ASPART 100 UNIT/ML ~~LOC~~ SOLN
5.0000 [IU] | Freq: Three times a day (TID) | SUBCUTANEOUS | Status: DC
  Administered 2016-08-28: 5 [IU] via SUBCUTANEOUS

## 2016-08-27 MED ORDER — INSULIN GLARGINE 100 UNIT/ML ~~LOC~~ SOLN
20.0000 [IU] | Freq: Every day | SUBCUTANEOUS | Status: DC
  Administered 2016-08-27 – 2016-08-28 (×2): 20 [IU] via SUBCUTANEOUS
  Filled 2016-08-27 (×2): qty 0.2

## 2016-08-27 MED ORDER — INSULIN GLARGINE 100 UNIT/ML ~~LOC~~ SOLN: 20.0000 [IU] | Freq: Every day | SUBCUTANEOUS | Status: DC

## 2016-08-27 MED ORDER — INSULIN ASPART 100 UNIT/ML ~~LOC~~ SOLN
8.0000 [IU] | Freq: Once | SUBCUTANEOUS | Status: AC
  Administered 2016-08-27: 8 [IU] via SUBCUTANEOUS

## 2016-08-27 NOTE — Progress Notes (Signed)
Inpatient Diabetes Program Recommendations  AACE/ADA: New Consensus Statement on Inpatient Glycemic Control (2015)  Target Ranges:  Prepandial:   less than 140 mg/dL      Peak postprandial:   less than 180 mg/dL (1-2 hours)      Critically ill patients:  140 - 180 mg/dL   Lab Results  Component Value Date   GLUCAP 383 (H) 08/27/2016   HGBA1C 7.1 (H) 08/26/2016   Results for Mary [name], Mary [name] (MRN [id number]) as of 08/27/2016 13:47  Ref. Range 08/26/2016 07:26 08/26/2016 11:21 08/26/2016 16:51 08/26/2016 16:53 08/26/2016 19:02 08/26/2016 19:03 08/26/2016 20:24 08/27/2016 01:05 08/27/2016 07:31 08/27/2016 11:20  Glucose-Capillary Latest Ref Range: 65 - 99 mg/dL 290 (H) 364 (H) 595 (HH) 516 (HH) >600 (HH) >600 (HH) 494 (H) 335 (H) 282 (H) 383 (H)   Review of Glycemic Control  Diabetes history: DM2  Outpatient Diabetes medications: Metformin 500 mg bid+ Basiglar 25 units daily (patient has not taken for approx. 3 weeks prior to talking with Alfonse Flavors with IDP in 07/2016)+ Novolog 8 units tid meal coverage (patient has not taken x 2 weeks prior to talking with Alfonse Flavors with IDP in 07/2016).  No current home medications noted.  Current orders for Inpatient glycemic control:  Novolog Moderate Correction, Lantus 20 units daily  Inpatient Diabetes Program Recommendations:   Please consider increasing Novolog Correction Scale to Resistant and Novolog 4 units of meal coverage if patient eats >50% of meal.  Thank you, Windy Carina, RN, BSN Diabetes Coordinator Inpatient Diabetes Program [phone] (Team Pager) [phone] (AP office) [phone] [facility] office) [phone] [facility] office)

## 2016-08-27 NOTE — Progress Notes (Signed)
  Echocardiogram 2D Echocardiogram has been performed.  Delcie RochENNINGTON, Estanislao Harmon 08/27/2016, 4:45 PM

## 2016-08-27 NOTE — Progress Notes (Signed)
CRITICAL VALUE ALERT  Critical value received:  Glucose  Date of notification:  08/26/2016  Time of notification:  1940  Critical value read back: Yes  Nurse who received alert:  Clydene LamingMorgan Abel Ra, RN  MD notified (1st page):  Veronia BeetsK. Shorr, MD.  Time of first page:  1940  MD notified (2nd page):  Time of second page:  Responding MD:  K. Shorr, MD.  Time MD responded:  [phone]   Order received for one time dose 10 units of Novolog subq.

## 2016-08-27 NOTE — Progress Notes (Signed)
SATURATION QUALIFICATIONS: (This note is used to comply with regulatory documentation for home oxygen)  Patient Saturations on Room Air at Rest   Patient Saturations on Room Air while Ambulating   Patient Saturations on 3Liters of oxygen while Ambulating =84 - 90 %  Please briefly explain why patient needs home oxygen: Pt on continuous oxygen use at 3 LPM nasal cannula. On ambulation pt had  Her oxygen saturation go down as low as 84 %.  And a high of 90 % while on O2 at 3LPM via nasal cannula.

## 2016-08-27 NOTE — Plan of Care (Signed)
Problem: ICU Phase Progression Outcomes Goal: O2 sats trending toward baseline Outcome: Not Progressing Pt still needing 4L of oxygen to maintain sats above 90%. Baseline is 2L of oxygen.

## 2016-08-27 NOTE — Progress Notes (Signed)
CBG 406. Has Insulin Aspart for bedtime. Coverage goes up to 400 blood sugar. Paged NP Burnadette PeterLynch. Stat lab verification resulted in 357 blood sugar. NP Lynch ordered coverage 8 units of Insulin Aspart. Will continue to monitor.

## 2016-08-27 NOTE — Progress Notes (Signed)
Name: Mary HakimMichelle L [name] MRN: [id number] DOB: 09/07/1976    ADMISSION DATE:  08/25/2016 CONSULTATION DATE:  08/26/2016  REFERRING MD :  Thedore MinsSingh ([place])   CHIEF COMPLAINT:  Short of breath  Significant events: 9/20 Admit 9/21 Palliative care consulted  Pulmonary tests: A1AT 03/07/12 >> 340, MM PFTS 04/612 >> FEV1 0.71 (24%), FEV1% 48, TLC 4.21 (88%), DLCO 32%, + BD  ABG 01/03/16 >> pH 7.30, pCO2 76.6, PO2 87.6 CT angio chest 04/02/16 >> diffuse airway thickening, tracheomalacia, ATX, airtrapping, RUL GGO 18 mm  Cardiac tests Echo 01/06/16 >> EF 55 to 60%, mild MR, PAS 44 mmHg RHC 01/08/16 >> RA 11, RV 41/4/8, PA 45/16/35, PCWP 11, CI 2.4, PVR 5.3 WU  Sleept tests: PSG 04/04/12 >> AHI 5, SpO2 low 73%  SUBJECTIVE: coughing up brown mucous   VITAL SIGNS: Temp:  [97.8 F (36.6 C)-98.7 F (37.1 C)] 98.7 F (37.1 C) (09/22 0539) Pulse Rate:  [68-100] 68 (09/22 0539) Resp:  [13-20] 13 (09/22 0539) BP: (102-124)/(49-104) 102/66 (09/22 0539) SpO2:  [89 %-96 %] 96 % (09/22 0917) Weight:  [198 lb 9.6 oz (90.1 kg)] 198 lb 9.6 oz (90.1 kg) (09/22 0539)  PHYSICAL EXAMINATION: General:  Pleasant but anxious female, NAD on [place]  Neuro:  Awake, alert, appropriate, MAE  HEENT:  Mm moist, no JVD, very hoarse phonation  Cardiovascular:  s1s2 rrr trace edema Lungs:  resps even non labored at rest on 4L [place], diminished throughout,no wheeze  Abdomen:  Round, soft, non tender  Musculoskeletal:  Warm and dry, scant BLE edema  Lymphatics:  No appreciated cervical or supraclavicular LAD. Psychiatric:  A&Ox4. Mood and affect congruent. Appropriately concerned.   Recent Labs Lab 08/26/16 0403 08/26/16 1843 08/26/16 1936 08/27/16 0507  NA 133*  --  133* 133*  K 5.0  --  4.8 4.3  CL 92*  --  89* 87*  CO2 31  --  32 36*  BUN 6  --  17 15  CREATININE 0.59  --  0.98 0.58  GLUCOSE 265* 566* 576* 359*    Recent Labs Lab 08/25/16 0713 08/26/16 0403  HGB 13.8 14.0  HCT 44.9 42.4  WBC 10.7*  11.3*  PLT 157 160   No results found.  ASSESSMENT / PLAN: Chronic respiratory failure Severe COPD Acute exacerbation of COPD Reactive airway disease.  LPR OSA Mild PAH Probable element of cor pulmonale  Acute on chronic respiratory failure - multifactorial r/t acute exacerbation of severe COPD, probable LPR and decompensated heart failure and probably significant deconditioning.  Seems her breathing gets worse as she tapers her steroids.  Mild edema on CXR. Last echo in January 2017 with ef 55-60%, PASP 44mmHg.     REC -  -Supplemental O2 - wean as able to keep sats >88% - home 2-3L  -Continue IV steroids for now - hopefully in am can change to PO steroids with very slowly taper to 10mg /day and continue until seen back in pulmonary office  -Continue home torsemide  -f/u echo now   -Continue breo, spiriva, roflumilast  -PT/OT eval  - add doxy - cont bipap at HS and PRN  Simonne MartinetPeter E Babcock ACNP-BC [facility] Pulmonary/Critical Care Pager # [phone] OR # [phone] if no answer  Feels hoarse.  Still has cough with yellow/brown sputum.   Raspy voice.  No wheeze.  HR regular.  Abdomen soft.  No edema.  Creatinine 0.58, CO2 36.  Assessment/plan:  Acute on chronic hypoxic/hypercapnic respiratory failure. AECOPD. COPD with chronic bronchitis  and bronchodilator responsiveness. OSA. Tracheomalacia. Plan: - add doxycycline - continue breo, spiriva, daliresp, singulair - continue solumedrol - Bipap qhs and prn - oxygen to keep SpO2 90 to 95% - might need further allergy testing as outpt  Cor pulmonale. Plan: - continue torsemide  Chronic hoarseness with concern for laryngopharyngeal reflux. Plan: - continue BID protonix  Coralyn Helling, MD [facility] Pulmonary/Critical Care 08/27/2016, 11:15 AM Pager:  [phone] After 3pm call: [phone]

## 2016-08-27 NOTE — Progress Notes (Signed)
Received consult. Spoke with patient. She argued with me that she does not have diabetes. "This was the last thing that she needed to hear".  She has been on Basalglar and Humalog in the past. She said that the insulin was given to her previously just while she was on steroids. It was then discontinued.  She states that her blood sugars are fine at home when not on steroids. Was seen by palliative care yesterday.  HgbA1C is higher now as 7.1%.  Has been 5.4% in the past. Smith MinceKendra Bogdan Vivona RN BSN CDE

## 2016-08-27 NOTE — Progress Notes (Signed)
PROGRESS NOTE                                                                                                                                                                                                             Patient Demographics:    Mary [name], is a 40 y.o. female, DOB - 1976-07-28, RUE:[id number]  Admit date - 08/25/2016   Admitting Physician Mary Rocks, MD  Outpatient Primary MD for the patient is Mary Luz, FNP  LOS - 1  Chief Complaint  Patient presents with  . Shortness of Breath       Brief Narrative    Mary [name] is a 40 y.o. female with medical history significant of CHF, DVT/PE, hepatic cirrhosis, advanced oxygen-dependent COPD/asthma who follows with Mary [name] locally and was also considered for lung transplant but declined due to BMI, patients with obstructive sleep apnea on BiPAP presenting w/ sob and worsening hypoxemia. She has now been admitted 4 times in the last 6 months for COPD exacerbation.  Patient states that after discharge she was in her normal state of health which is in a chronic O2 dependency of 3 L nasal cannula during the day and BiPAP at night. Patient endorses compliance with her home medical regimen including inhalers. Patient states that approximately 2 days ago she started becoming very short of breath. This required her to increase her home O2. Symptoms are worsened significantly with ambulation. Constant. Denies any productive cough, lower extremity swelling, weight change, fevers, chest pain, palpitations, neck stiffness, headache, rash. Patient states that overnight she woke up multiple times due to apnea. Patient states she knows that this happened because she woke up gasping for air. Patient states that home O2 saturations As low as 61% prior to admission. Associated with intermittent wheezing.   Subjective:    Mary [name] today has, No headache,  No chest pain, No abdominal pain - No Nausea, No new weakness tingling or numbness, She has a dry cough and exertional shortness of breath, minimally better.   Assessment  & Plan :     1.Acute on chronic hypoxic and hypercapnic respiratory failure in a patient with advanced COPD on 3 L nasal cannula oxygen, also obstructive sleep apnea requiring BiPAP at night. Patient has been referred to a tertiary care center for lung transplant but was turned down  due to BMI. Fourth admission for similar problem in less than 6 months.  At this time she is being treated for possible COPD exacerbation, possible tracheomalacia per last CT scan in April of this year, minimal wheezing ? Mostly emphysema, continue IV steroids, nebulizer treatments and oxygen for supportive care. Since she is requiring recurrent admissions and appears to have a complex pulmonary history We have involved pulmonary critical care were following the patient, they have adjusted the medications further with some improvement, pulmonary culture no strep patient well and they have involved palliative care as well as per pulmonary critical care long-term prognosis is not good.   2. Chronic diastolic CHF with last EF in January of this year of 60%. Clinically appears compensated, no orthopnea, BNP is stable, continue home dose diuretic and monitor.  PCCM ordered new TTE.   3. GERD. On PPI.  4. Chronic pain. Continue tramadol and Lyrica.  5. OSA. BiPAP daily at bedtime.  6. Steroid-induced hyperglycemia question now DM type II. Currently on sliding scale will add Lantus, will also have diverticula and insulin education provided to the patient while she is here.    CBG (last 3)   Recent Labs  08/26/16 2024 08/27/16 0105 08/27/16 0731  GLUCAP 494* 335* 282*    Lab Results  Component Value Date   HGBA1C 7.1 (H) 08/26/2016       Family Communication  : None  Code Status :  Full  Diet : Heart healthy  Disposition Plan  :   Home in 2-3 days  Consults  :  Pulmonary, Palliative care consulted by pulmonary  Procedures  :  None so far  DVT Prophylaxis  :  Heparin   Lab Results  Component Value Date   PLT 160 08/26/2016    Inpatient Medications  Scheduled Meds: . fluticasone furoate-vilanterol  1 puff Inhalation Daily  . heparin  5,000 Units Subcutaneous Q8H  . insulin aspart  0-15 Units Subcutaneous TID WC  . insulin aspart  0-5 Units Subcutaneous QHS  . insulin glargine  20 Units Subcutaneous Daily  . ipratropium-albuterol  3 mL Nebulization Q4H  . methylPREDNISolone (SOLU-MEDROL) injection  60 mg Intravenous Daily  . montelukast  5 mg Oral QHS  . pantoprazole  40 mg Oral BID AC  . pregabalin  50 mg Oral TID  . roflumilast  500 mcg Oral Daily  . tiotropium  18 mcg Inhalation Daily  . torsemide  20 mg Oral Daily   Continuous Infusions:  PRN Meds:.acetaminophen **OR** [DISCONTINUED] acetaminophen, albuterol, ALPRAZolam, promethazine, traMADol  Antibiotics  :    Anti-infectives    None         Objective:   Vitals:   08/26/16 1601 08/26/16 2025 08/26/16 2054 08/27/16 0539  BP: (!) 124/104 (!) 104/49  102/66  Pulse: 98 100  68  Resp: 16 20  13   Temp: 98.1 F (36.7 C) 97.8 F (36.6 C)  98.7 F (37.1 C)  TempSrc: Oral Oral  Oral  SpO2: (!) 89% 95% 95% 95%  Weight:    90.1 kg (198 lb 9.6 oz)  Height:        Wt Readings from Last 3 Encounters:  08/27/16 90.1 kg (198 lb 9.6 oz)  08/13/16 89.6 kg (197 lb 8.5 oz)  07/29/16 90.3 kg (199 lb)     Intake/Output Summary (Last 24 hours) at 08/27/16 0839 Last data filed at 08/27/16 0539  Gross per 24 hour  Intake  780 ml  Output             3550 ml  Net            -2770 ml     Physical Exam  Awake Alert, Oriented X 3, No new F.N deficits, Normal affect Steger.AT,PERRAL Supple Neck,No JVD, No cervical lymphadenopathy appriciated.  Symmetrical Chest wall movement, Good air movement bilaterally, Few rales and minimal  expiratory wheezes RRR,No Gallops,Rubs or new Murmurs, No Parasternal Heave +ve B.Sounds, Abd Soft, No tenderness, No organomegaly appriciated, No rebound - guarding or rigidity. No Cyanosis, Clubbing or edema, No new Rash or bruise       Data Review:    CBC  Recent Labs Lab 08/25/16 0713 08/26/16 0403  WBC 10.7* 11.3*  HGB 13.8 14.0  HCT 44.9 42.4  PLT 157 160  MCV 102.3* 99.1  MCH 31.4 32.7  MCHC 30.7 33.0  RDW 13.6 13.3  LYMPHSABS 1.2  --   MONOABS 0.7  --   EOSABS 0.2  --   BASOSABS 0.0  --     Chemistries   Recent Labs Lab 08/25/16 0713 08/26/16 0403 08/26/16 1843 08/26/16 1936 08/27/16 0507  NA 138 133*  --  133* 133*  K 3.4* 5.0  --  4.8 4.3  CL 93* 92*  --  89* 87*  CO2 35* 31  --  32 36*  GLUCOSE 191* 265* 566* 576* 359*  BUN 7 6  --  17 15  CREATININE 0.53 0.59  --  0.98 0.58  CALCIUM 9.5 9.3  --  9.7 9.7  AST 34  --   --   --   --   ALT 52  --   --   --   --   ALKPHOS 105  --   --   --   --   BILITOT 0.6  --   --   --   --    ------------------------------------------------------------------------------------------------------------------ No results for input(s): CHOL, HDL, LDLCALC, TRIG, CHOLHDL, LDLDIRECT in the last 72 hours.  Lab Results  Component Value Date   HGBA1C 7.1 (H) 08/26/2016   ------------------------------------------------------------------------------------------------------------------ No results for input(s): TSH, T4TOTAL, T3FREE, THYROIDAB in the last 72 hours.  Invalid input(s): FREET3 ------------------------------------------------------------------------------------------------------------------ No results for input(s): VITAMINB12, FOLATE, FERRITIN, TIBC, IRON, RETICCTPCT in the last 72 hours.  Coagulation profile No results for input(s): INR, PROTIME in the last 168 hours.  No results for input(s): DDIMER in the last 72 hours.  Cardiac Enzymes No results for input(s): CKMB, TROPONINI, MYOGLOBIN in the last  168 hours.  Invalid input(s): CK ------------------------------------------------------------------------------------------------------------------    Component Value Date/Time   BNP 23.4 08/25/2016 0713    Micro Results No results found for this or any previous visit (from the past 240 hour(s)).  Radiology Reports Dg Chest 2 View  Result Date: 08/25/2016 CLINICAL DATA:  Shortness of breath this morning. Home oxygen. History of CHF and COPD EXAM: CHEST  2 VIEW COMPARISON:  08/11/2016 FINDINGS: Normal heart size and pulmonary vascularity. Mediastinal contours appear intact. Mild prominence of pulmonary outflow tract. Slight interstitial pattern to the lungs suggesting mild edema. No progression since previous study. Increased opacity in the left lung base probably represents prominent cardiac fat pad. No pneumothorax. No focal consolidation. IMPRESSION: Mild prominence of pulmonary outflow tract. Mild interstitial changes suggesting mild edema. Electronically Signed   By: Burman Nieves M.D.   On: 08/25/2016 05:56   Dg Chest 2 View  Result Date: 08/11/2016 CLINICAL DATA:  Four days of shortness of breath. History of COPD, pulmonary embolism, pulmonary hypertension, chronic CHF, former smoker. EXAM: CHEST  2 VIEW COMPARISON:  Portable chest x-ray of August 09, 2016 and PA and lateral chest x-ray of July 18, 2016. FINDINGS: The lungs are mildly hyperinflated. The interstitial markings are coarse in the lower lung zones but appear fairly stable. There is no alveolar infiltrate or pleural effusion. There is no pneumothorax. The heart is top-normal in size. The pulmonary vascularity is mildly prominent centrally and more conspicuous than on the previous study. The mediastinum is normal in width. The bony thorax exhibits no acute abnormality. IMPRESSION: COPD. Low-grade pulmonary interstitial edema. No alveolar pneumonia nor pleural effusion. Electronically Signed   By: David  [place] M.D.   On:  08/11/2016 08:08   Dg Chest Port 1 View  Result Date: 08/09/2016 CLINICAL DATA:  COPD exacerbation EXAM: PORTABLE CHEST 1 VIEW COMPARISON:  07/18/2016 FINDINGS: Lungs are clear.  No pleural effusion or pneumothorax. The heart is top-normal in size. IMPRESSION: No evidence of acute cardiopulmonary disease. Electronically Signed   By: Charline Bills M.D.   On: 08/09/2016 09:42    Time Spent in minutes  30   SINGH,PRASHANT K M.D on 08/27/2016 at 8:39 AM  Between 7am to 7pm - Pager - [phone]  After 7pm go to [email or web address] - password [facility]  [facility] -  Office  [phone]

## 2016-08-28 LAB — BASIC METABOLIC PANEL
ANION GAP: 11 (ref 5–15)
BUN: 16 mg/dL (ref 6–20)
CHLORIDE: 87 mmol/L — AB (ref 101–111)
CO2: 36 mmol/L — ABNORMAL HIGH (ref 22–32)
Calcium: 9.8 mg/dL (ref 8.9–10.3)
Creatinine, Ser: 0.58 mg/dL (ref 0.44–1.00)
Glucose, Bld: 168 mg/dL — ABNORMAL HIGH (ref 65–99)
POTASSIUM: 3.9 mmol/L (ref 3.5–5.1)
SODIUM: 134 mmol/L — AB (ref 135–145)

## 2016-08-28 LAB — GLUCOSE, CAPILLARY: GLUCOSE-CAPILLARY: 185 mg/dL — AB (ref 65–99)

## 2016-08-28 MED ORDER — PREDNISONE 10 MG PO TABS: ORAL_TABLET | ORAL | 1 refills | Status: DC

## 2016-08-28 MED ORDER — DOXYCYCLINE HYCLATE 100 MG PO TABS: 100.0000 mg | ORAL_TABLET | Freq: Two times a day (BID) | ORAL | 0 refills | Status: DC

## 2016-08-28 MED ORDER — PREDNISONE 20 MG PO TABS
50.0000 mg | ORAL_TABLET | Freq: Every day | ORAL | Status: DC
  Administered 2016-08-28: 50 mg via ORAL
  Filled 2016-08-28: qty 2

## 2016-08-28 MED ORDER — MONTELUKAST SODIUM 5 MG PO CHEW: 5.0000 mg | CHEWABLE_TABLET | Freq: Every day | ORAL | 0 refills | Status: DC

## 2016-08-28 NOTE — Discharge Instructions (Signed)
Follow with Primary MD Jeanine Luz, FNP in 7 days   Get CBC, CMP, 2 view Chest X ray checked  by Primary MD or SNF MD in 5-7 days ( we routinely change or add medications that can affect your baseline labs and fluid status, therefore we recommend that you get the mentioned basic workup next visit with your PCP, your PCP may decide not to get them or add new tests based on their clinical decision)   Activity: As tolerated with Full fall precautions use walker/cane & assistance as needed   Disposition Home     Diet:   Heart Healthy Low carb.  Take your home insulin regimen as you said you do.   Accuchecks 4 times/day, Once in AM empty stomach and then before each meal. Log in all results and show them to your Prim.MD in 3 days. If any glucose reading is under 80 or above 300 call your Prim MD immidiately. Follow Low glucose instructions for glucose under 80 as instructed.   For Heart failure patients - Check your Weight same time everyday, if you gain over 2 pounds, or you develop in leg swelling, experience more shortness of breath or chest pain, call your Primary MD immediately. Follow Cardiac Low Salt Diet and 1.5 lit/day fluid restriction.   On your next visit with your primary care physician please Get Medicines reviewed and adjusted.   Please request your Prim.MD to go over all Hospital Tests and Procedure/Radiological results at the follow up, please get all Hospital records sent to your Prim MD by signing hospital release before you go home.   If you experience worsening of your admission symptoms, develop shortness of breath, life threatening emergency, suicidal or homicidal thoughts you must seek medical attention immediately by calling 911 or calling your MD immediately  if symptoms less severe.  You Must read complete instructions/literature along with all the possible adverse reactions/side effects for all the Medicines you take and that have been prescribed to you.  Take any new Medicines after you have completely understood and accpet all the possible adverse reactions/side effects.   Do not drive, operate heavy machinery, perform activities at heights, swimming or participation in water activities or provide baby sitting services if your were admitted for syncope or siezures until you have seen by Primary MD or a Neurologist and advised to do so again.  Do not drive when taking Pain medications.    Do not take more than prescribed Pain, Sleep and Anxiety Medications  Special Instructions: If you have smoked or chewed Tobacco  in the last 2 yrs please stop smoking, stop any regular Alcohol  and or any Recreational drug use.  Wear Seat belts while driving.   Please note  You were cared for by a hospitalist during your hospital stay. If you have any questions about your discharge medications or the care you received while you were in the hospital after you are discharged, you can call the unit and asked to speak with the hospitalist on call if the hospitalist that took care of you is not available. Once you are discharged, your primary care physician will handle any further medical issues. Please note that NO REFILLS for any discharge medications will be authorized once you are discharged, as it is imperative that you return to your primary care physician (or establish a relationship with a primary care physician if you do not have one) for your aftercare needs so that they can reassess your need for medications  and monitor your lab values.

## 2016-08-28 NOTE — Discharge Summary (Signed)
Mary [name] MCR:[id number] DOB: [birth year] DOA: 08/25/2016  PCP: Mauricio Po, FNP  Admit date: 08/25/2016  Discharge date: 08/28/2016  Admitted From: Home  Disposition:  Home   Recommendations for Outpatient Follow-up:   Follow up with PCP in 1-2 weeks  PCP Please obtain BMP/CBC, 2 view CXR in 1week,  (see Discharge instructions)   PCP Please follow up on the following pending results: None   Home Health: None   Equipment/Devices: None  Consultations: PCCM Discharge Condition: Stable   CODE STATUS: Full   Diet Recommendation: Heart Healthy Low carb   Chief Complaint  Patient presents with  . Shortness of Breath     Brief history of present illness from the day of admission and additional interim summary     Mary [name] a 40 y.o.femalewith medical history significant of CHF, DVT/PE, hepatic cirrhosis, advanced oxygen-dependent COPD/asthma who follows with Dr. Chase Caller locally and was also considered for lung transplant but declined due to BMI, patients with obstructive sleep apnea on BiPAP presenting w/ sob and worsening hypoxemia. She has now been admitted 4 times in the last 6 months for COPD exacerbation.  Patient states that after discharge she was in her normal state of health which is in a chronic O2 dependency of 3 L nasal cannula during the day and BiPAP at night. Patient endorses compliance with her home medical regimen including inhalers. Patient states that approximately 2 days ago she started becoming very short of breath. This required her to increase her home O2. Symptoms are worsened significantly with ambulation. Constant. Denies any productive cough, lower extremity swelling, weight change, fevers, chest pain, palpitations, neck stiffness, headache, rash. Patient states that  overnight she woke up multiple times due to apnea. Patient states she knows that this happened because she woke up gasping for air. Patient states that home O2 saturations As low as 61% prior to admission. Associated with intermittent wheezing.  Hospital issues addressed     1.Acute on chronic hypoxic and hypercapnic respiratory failure in a patient with advanced COPD on 3 To 4  L nasal cannula oxygen, also obstructive sleep apnea requiring BiPAP at night. Patient has been referred to a tertiary care center for lung transplant but was turned down due to BMI. Fourth admission for similar problem in less than 6 months.  At this time she is being treated for possible COPD exacerbation With minimal wheezing, possible tracheomalacia per last CT scan in April of this year, with her minimal to no wheezing ? Mostly emphysema, initially kept on IV steroids per pulmonary okay to switch on oral steroids today, continue her nebulizer treatments and oxygen for supportive care.   She clinically looks great, per pulmonary okay to discharge, I actually discussed her case with her primary pulmonary is Dr. Chase Caller who informs me that patient has poor living conditions and hence most likely likes to stay in the hospital, having said that she also has advanced disease. She has long-standing history of rapid improvement questioning her compliance at home, this morning  she is sitting at the side of the bed having breakfast and talking in full sentences without any discomfort, however she gets extremely angry when we discuss about going home which we did yesterday as well and she wanted one more day, she now wants another day but clinically this is not indicated. She will be placed on steroid taper which will stop at 20 mg daily till she sees Dr. Chase Caller she has been instructed to see him in a week, she will also get 5 days of doxycycline, have added Singler and will continue her other home medications unchanged. She has 3-4  L nasal cannula oxygen at home which she will continue to do, this morning while having breakfast and talking to me in the bed she is using 3 L nasal cannula oxygen with pulse ox of 95%.   2. Chronic diastolic CHF with last EF in January of this year of 65%, echo essentially unchanged again. Clinically appears compensated, no orthopnea, BNP is stable, continue home dose diuretic and monitor. [facility] M order new echocardiogram which was stable as well without any pulmonary hypertension.   3. GERD. On PPI.  4. Chronic pain. Continue tramadol and Lyrica.  5. OSA. BiPAP daily at bedtime.  6. Steroid-induced hyperglycemia question now DM type II. A1c 7.1, she was seen by diabetic educator, she refuses to take any new prescriptions of insulin, she says she has both long-acting and sliding scale insulin at home for steroid-induced hyperglycemia in the past and she knows how to take it, she has been instructed to do so again. She says she does not require any education.    Discharge diagnosis     Active Problems:   OSA on BiPAP   Acute on chronic respiratory failure with hypoxia (HCC)   Esophageal reflux   COPD (chronic obstructive pulmonary disease) (HCC)   Chronic diastolic heart failure (HCC)   COPD exacerbation (HCC)   Diabetic neuropathy ([place])   Palliative care by specialist   Anxiety state   Advance directive declined by patient   Goals of care, counseling/discussion   Acute on chronic respiratory failure with hypoxia and hypercapnia (HCC)   COPD (chronic obstructive pulmonary disease) with chronic bronchitis ([place])   Tracheomalacia    Discharge instructions    Discharge Instructions    Discharge instructions    Complete by:  As directed    Follow with Primary MD Mauricio Po, FNP in 7 days   Get CBC, CMP, 2 view Chest X ray checked  by Primary MD or SNF MD in 5-7 days ( we routinely change or add medications that can affect your baseline labs and fluid status,  therefore we recommend that you get the mentioned basic workup next visit with your PCP, your PCP may decide not to get them or add new tests based on their clinical decision)   Activity: As tolerated with Full fall precautions use walker/cane & assistance as needed   Disposition Home     Diet:   Heart Healthy Low carb.  Take your home insulin regimen as you said you do.   Accuchecks 4 times/day, Once in AM empty stomach and then before each meal. Log in all results and show them to your Prim.MD in 3 days. If any glucose reading is under 80 or above 300 call your Prim MD immidiately. Follow Low glucose instructions for glucose under 80 as instructed.   For Heart failure patients - Check your Weight same time everyday, if you gain over 2  pounds, or you develop in leg swelling, experience more shortness of breath or chest pain, call your Primary MD immediately. Follow Cardiac Low Salt Diet and 1.5 lit/day fluid restriction.   On your next visit with your primary care physician please Get Medicines reviewed and adjusted.   Please request your Prim.MD to go over all Hospital Tests and Procedure/Radiological results at the follow up, please get all Hospital records sent to your Prim MD by signing hospital release before you go home.   If you experience worsening of your admission symptoms, develop shortness of breath, life threatening emergency, suicidal or homicidal thoughts you must seek medical attention immediately by calling 911 or calling your MD immediately  if symptoms less severe.  You Must read complete instructions/literature along with all the possible adverse reactions/side effects for all the Medicines you take and that have been prescribed to you. Take any new Medicines after you have completely understood and accpet all the possible adverse reactions/side effects.   Do not drive, operate heavy machinery, perform activities at heights, swimming or participation in water  activities or provide baby sitting services if your were admitted for syncope or siezures until you have seen by Primary MD or a Neurologist and advised to do so again.  Do not drive when taking Pain medications.    Do not take more than prescribed Pain, Sleep and Anxiety Medications  Special Instructions: If you have smoked or chewed Tobacco  in the last 2 yrs please stop smoking, stop any regular Alcohol  and or any Recreational drug use.  Wear Seat belts while driving.   Please note  You were cared for by a hospitalist during your hospital stay. If you have any questions about your discharge medications or the care you received while you were in the hospital after you are discharged, you can call the unit and asked to speak with the hospitalist on call if the hospitalist that took care of you is not available. Once you are discharged, your primary care physician will handle any further medical issues. Please note that NO REFILLS for any discharge medications will be authorized once you are discharged, as it is imperative that you return to your primary care physician (or establish a relationship with a primary care physician if you do not have one) for your aftercare needs so that they can reassess your need for medications and monitor your lab values.   Increase activity slowly    Complete by:  As directed       Discharge Medications     Medication List    TAKE these medications   albuterol 108 (90 Base) MCG/ACT inhaler Commonly known as:  PROVENTIL HFA;VENTOLIN HFA Inhale 2 puffs into the lungs every 6 (six) hours as needed for wheezing or shortness of breath.   blood glucose meter kit and supplies Kit Dispense based on patient and insurance preference. Use up to four times daily as directed. (FOR ICD-9 250.00, 250.01).   BREO ELLIPTA 100-25 MCG/INH Aepb Generic drug:  fluticasone furoate-vilanterol INHALE 1 PUFF INTO THE LUNGS DAILY.   doxycycline 100 MG tablet Commonly  known as:  VIBRA-TABS Take 1 tablet (100 mg total) by mouth every 12 (twelve) hours.   montelukast 5 MG chewable tablet Commonly known as:  SINGULAIR Chew 1 tablet (5 mg total) by mouth at bedtime.   pantoprazole 40 MG tablet Commonly known as:  PROTONIX Take 1 tablet (40 mg total) by mouth 2 (two) times daily before a meal.  potassium chloride SA 20 MEQ tablet Commonly known as:  K-DUR,KLOR-CON Take 1 tablet (20 mEq total) by mouth daily.   predniSONE 10 MG tablet Commonly known as:  DELTASONE Take 5 pills daily for 3 days, then 4 pills daily for 3 days, then 3 pills daily for 3 days, then 2 pills daily until you see a pulmonary doctor thereafter according to him   pregabalin 50 MG capsule Commonly known as:  LYRICA Take 1 capsule (50 mg total) by mouth 3 (three) times daily.   roflumilast 500 MCG Tabs tablet Commonly known as:  DALIRESP Take 500 mcg by mouth daily.   SPIRIVA RESPIMAT 1.25 MCG/ACT Aers Generic drug:  Tiotropium Bromide Monohydrate INHALE 2 PUFFS INTO THE LUNGS 2 (TWO) TIMES DAILY.   torsemide 20 MG tablet Commonly known as:  DEMADEX Take 1 tablet (20 mg total) by mouth daily.   traMADol 50 MG tablet Commonly known as:  ULTRAM TAKE 1 TABLET BY MOUTH EVERY 6 HOURS AS NEEDED FOR SEVERE PAIN       Follow-up Information    Mauricio Po, FNP. Schedule an appointment as soon as possible for a visit in 1 week(s).   Specialty:  Family Medicine Contact information: [place] [address] [phone]        RAMASWAMY,MURALI, MD. Schedule an appointment as soon as possible for a visit in 1 week(s).   Specialty:  Pulmonary Disease Contact information: [place] [address] [phone]           Major procedures and Radiology Reports - PLEASE review detailed and final reports thoroughly  -       TTE  Left ventricle: The cavity size was normal. Systolic function was   vigorous. The estimated ejection fraction  was in the range of 65%   to 70%. Wall motion was normal; there were no regional wall   motion abnormalities. Doppler parameters are consistent with   abnormal left ventricular relaxation (grade 1 diastolic   dysfunction). Doppler parameters are consistent with elevated   ventricular end-diastolic filling pressure. - Aortic valve: Trileaflet; normal thickness leaflets.   Transvalvular velocity was within the normal range. There was no   stenosis. There was no regurgitation. - Aortic root: The aortic root was normal in size. - Right ventricle: Systolic function was normal. - Right atrium: The atrium was normal in size. - Tricuspid valve: There was no regurgitation. - Pulmonary arteries: Systolic pressure was within the normal   range. - Inferior vena cava: The vessel was normal in size. - Pericardium, extracardiac: There was no pericardial effusion.   Dg Chest 2 View  Result Date: 08/25/2016 CLINICAL DATA:  Shortness of breath this morning. Home oxygen. History of CHF and COPD EXAM: CHEST  2 VIEW COMPARISON:  08/11/2016 FINDINGS: Normal heart size and pulmonary vascularity. Mediastinal contours appear intact. Mild prominence of pulmonary outflow tract. Slight interstitial pattern to the lungs suggesting mild edema. No progression since previous study. Increased opacity in the left lung base probably represents prominent cardiac fat pad. No pneumothorax. No focal consolidation. IMPRESSION: Mild prominence of pulmonary outflow tract. Mild interstitial changes suggesting mild edema. Electronically Signed   By: Lucienne Capers M.D.   On: 08/25/2016 05:56   Dg Chest 2 View  Result Date: 08/11/2016 CLINICAL DATA:  Four days of shortness of breath. History of COPD, pulmonary embolism, pulmonary hypertension, chronic CHF, former smoker. EXAM: CHEST  2 VIEW COMPARISON:  Portable chest x-ray of August 09, 2016  and PA and lateral chest x-ray of July 18, 2016. FINDINGS: The lungs are mildly  hyperinflated. The interstitial markings are coarse in the lower lung zones but appear fairly stable. There is no alveolar infiltrate or pleural effusion. There is no pneumothorax. The heart is top-normal in size. The pulmonary vascularity is mildly prominent centrally and more conspicuous than on the previous study. The mediastinum is normal in width. The bony thorax exhibits no acute abnormality. IMPRESSION: COPD. Low-grade pulmonary interstitial edema. No alveolar pneumonia nor pleural effusion. Electronically Signed   By: David  Martinique M.D.   On: 08/11/2016 08:08   Dg Chest Port 1 View  Result Date: 08/09/2016 CLINICAL DATA:  COPD exacerbation EXAM: PORTABLE CHEST 1 VIEW COMPARISON:  07/18/2016 FINDINGS: Lungs are clear.  No pleural effusion or pneumothorax. The heart is top-normal in size. IMPRESSION: No evidence of acute cardiopulmonary disease. Electronically Signed   By: Julian Hy M.D.   On: 08/09/2016 09:42    Micro Results     No results found for this or any previous visit (from the past 240 hour(s)).  Today   Subjective    Claribel Sachs today has no headache,no chest abdominal pain,no new weakness tingling or numbness, she is having breakfast sitting in the hospital bed in no distress whatsoever talking in full sentences without any discomfort   Objective   Blood pressure 101/60, pulse 69, temperature 98 F (36.7 C), temperature source Oral, resp. rate (!) 22, height 5' 2"  (1.575 m), weight 89.9 kg (198 lb 3.2 oz), last menstrual period 08/21/2016, SpO2 98 %.   Intake/Output Summary (Last 24 hours) at 08/28/16 0922 Last data filed at 08/28/16 0836  Gross per 24 hour  Intake              720 ml  Output             1600 ml  Net             -880 ml    Exam Awake Alert, Oriented x 3, No new F.N deficits, Normal affect [place].AT,PERRAL Supple Neck,No JVD, No cervical lymphadenopathy appriciated.  Symmetrical Chest wall movement, Moderate air movement bilaterally, No  wheezing RRR,No Gallops,Rubs or new Murmurs, No Parasternal Heave +ve B.Sounds, Abd Soft, Non tender, No organomegaly appriciated, No rebound -guarding or rigidity. No Cyanosis, Clubbing or edema, No new Rash or bruise   Data Review   CBC w Diff: Lab Results  Component Value Date   WBC 11.3 (H) 08/26/2016   HGB 14.0 08/26/2016   HCT 42.4 08/26/2016   PLT 160 08/26/2016   LYMPHOPCT 11 08/25/2016   MONOPCT 7 08/25/2016   EOSPCT 2 08/25/2016   BASOPCT 0 08/25/2016    CMP: Lab Results  Component Value Date   NA 134 (L) 08/28/2016   K 3.9 08/28/2016   CL 87 (L) 08/28/2016   CO2 36 (H) 08/28/2016   BUN 16 08/28/2016   CREATININE 0.58 08/28/2016   CREATININE 0.65 03/21/2014   PROT 6.8 08/25/2016   ALBUMIN 3.7 08/25/2016   BILITOT 0.6 08/25/2016   ALKPHOS 105 08/25/2016   AST 34 08/25/2016   ALT 52 08/25/2016  . Lab Results  Component Value Date   HGBA1C 7.1 (H) 08/26/2016     Total Time in preparing paper work, data evaluation and todays exam - 35 minutes  Thurnell Lose M.D on 08/28/2016 at [facility]   Office  [phone]

## 2016-08-30 LAB — GLUCOSE, CAPILLARY
GLUCOSE-CAPILLARY: 595 mg/dL — AB (ref 65–99)
Glucose-Capillary: >600 mg/dL (ref 65–99)

## 2016-09-05 ENCOUNTER — Other Ambulatory Visit: Payer: Self-pay | Admitting: Internal Medicine

## 2016-09-07 ENCOUNTER — Ambulatory Visit (INDEPENDENT_AMBULATORY_CARE_PROVIDER_SITE_OTHER): Payer: 59 | Admitting: Internal Medicine

## 2016-09-07 ENCOUNTER — Encounter: Payer: Self-pay | Admitting: Internal Medicine

## 2016-09-07 VITALS — BP 142/80 | HR 115 | Ht 62.0 in | Wt 196.0 lb

## 2016-09-07 DIAGNOSIS — I2781 Cor pulmonale (chronic): Secondary | ICD-10-CM

## 2016-09-07 DIAGNOSIS — J9612 Chronic respiratory failure with hypercapnia: Secondary | ICD-10-CM

## 2016-09-07 DIAGNOSIS — J9611 Chronic respiratory failure with hypoxia: Secondary | ICD-10-CM | POA: Diagnosis not present

## 2016-09-07 DIAGNOSIS — R490 Dysphonia: Secondary | ICD-10-CM | POA: Diagnosis not present

## 2016-09-07 NOTE — Progress Notes (Signed)
Subjective:     Patient ID: Mary [name], female   DOB: 1976/06/06, 40 y.o.   MRN: [id number]  HPI   OV 09/07/2016  Chief Complaint  Patient presents with  . Follow-up    Pt has not seen ENT yet, due to canceling appt. Pt recently hospitalized for COPD from 9/20-9/23. Pt states her SOB is not back to baseline. Pt c/o prod cough with clear mucus and chest tightness.   40 year old female with cor pulmonale and advanced COPD with chronic hypercapnia. Presents for routine follow-up. Admitted again for CPD exacerbation. Unclear why she gets recurrent exacerbations but suspect home environment and underlying baseline very severe disease. She says she's compliant with oxygen and on the medications. She is frustrated that she got discharged early. She is chronic hoarseness of voice but has not admitted to the ENT appointment. She did meet with palliative care in the hospital and according to her she wants to think about this. She does understand the difference between home hospice versus home palliative care and she's not interested in home palliative care either at this point. She's not interested in chronic prednisone which we discussed. She says she's had a flu shot     has a past medical history of Axillary adenopathy; Chronic right-sided CHF (congestive heart failure) (03/23/2012); COPD (chronic obstructive pulmonary disease) ([place]); DVT of upper extremity (deep vein thrombosis) [facility]) (April 2013); Exertional shortness of breath; Hepatic cirrhosis ([place]); History of alcoholism ([place]); History of chronic bronchitis; Irregular menses; Migraine; Moderate to severe pulmonary hypertension (April 2013); On home oxygen therapy; OSA (obstructive sleep apnea); Periodontal disease; Pneumonia (~ 1985; 01/2011); and Pulmonary embolus [facility]) (April 2013).   reports that she quit smoking about 4 years ago. Her smoking use included Cigarettes. She has a 25.00 pack-year smoking history. She has never used smokeless  tobacco.  Past Surgical History:  Procedure Laterality Date  . APPENDECTOMY  05/03/2013  . CARDIAC CATHETERIZATION  03/2012  . CARDIAC CATHETERIZATION N/A 01/08/2016   Procedure: Right Heart Cath;  Surgeon: Jolaine Artist, MD;  Location: [facility];  Service: Cardiovascular;  Laterality: N/A;  . CARDIAC SURGERY  05/15/1977   "my heart was backwards" (05/03/2013)  . LAPAROSCOPIC APPENDECTOMY N/A 05/03/2013   Procedure: APPENDECTOMY LAPAROSCOPIC;  Surgeon: Gwenyth Ober, MD;  Location: [facility];  Service: General;  Laterality: N/A;  . LUNG SURGERY  1976-10-30  . RIGHT HEART CATHETERIZATION N/A 03/07/2012   Procedure: RIGHT HEART CATH;  Surgeon: Burnell Blanks, MD;  Location: [facility];  Service: Cardiovascular;  Laterality: N/A;    No Known Allergies  Immunization History  Administered Date(s) Administered  . Influenza Split 01/06/2011, 10/20/2012, 09/06/2013  . Influenza, High Dose Seasonal PF 07/29/2016  . Influenza,inj,Quad PF,36+ Mos 09/27/2014, 09/16/2015  . Pneumococcal Conjugate-13 09/16/2015  . Pneumococcal Polysaccharide-23 01/06/2011  . Tdap 09/06/2013    Family History  Problem Relation Age of Onset  . Hypertension Father   . Heart disease Father     CHF; died age 63   . Alcohol abuse Father   . Other Brother     died age 21 y.o overdose     Current Outpatient Prescriptions:  .  albuterol (PROVENTIL HFA;VENTOLIN HFA) 108 (90 BASE) MCG/ACT inhaler, Inhale 2 puffs into the lungs every 6 (six) hours as needed for wheezing or shortness of breath., Disp: 8.5 g, Rfl: 1 .  blood glucose meter kit and supplies KIT, Dispense based on patient and insurance preference. Use up to four  times daily as directed. (FOR ICD-9 250.00, 250.01)., Disp: 1 each, Rfl: 0 .  BREO ELLIPTA 100-25 MCG/INH AEPB, INHALE 1 PUFF INTO THE LUNGS DAILY., Disp: 60 each, Rfl: 3 .  doxycycline (VIBRA-TABS) 100 MG tablet, Take 1 tablet (100 mg total) by mouth every 12 (twelve) hours., Disp: 5  tablet, Rfl: 0 .  pantoprazole (PROTONIX) 40 MG tablet, Take 1 tablet (40 mg total) by mouth 2 (two) times daily before a meal., Disp: 60 tablet, Rfl: 0 .  potassium chloride SA (K-DUR,KLOR-CON) 20 MEQ tablet, Take 1 tablet (20 mEq total) by mouth daily., Disp: 30 tablet, Rfl: 6 .  predniSONE (DELTASONE) 10 MG tablet, Take 5 pills daily for 3 days, then 4 pills daily for 3 days, then 3 pills daily for 3 days, then 2 pills daily until you see a pulmonary doctor thereafter according to him, Disp: 30 tablet, Rfl: 1 .  pregabalin (LYRICA) 50 MG capsule, Take 1 capsule (50 mg total) by mouth 3 (three) times daily., Disp: 90 capsule, Rfl: 1 .  roflumilast (DALIRESP) 500 MCG TABS tablet, Take 500 mcg by mouth daily., Disp: , Rfl:  .  SPIRIVA RESPIMAT 1.25 MCG/ACT AERS, INHALE 2 PUFFS INTO THE LUNGS 2 (TWO) TIMES DAILY., Disp: 1 Inhaler, Rfl: 3 .  torsemide (DEMADEX) 20 MG tablet, Take 1 tablet (20 mg total) by mouth daily., Disp: 90 tablet, Rfl: 0 .  traMADol (ULTRAM) 50 MG tablet, TAKE 1 TABLET BY MOUTH EVERY 6 HOURS AS NEEDED FOR SEVERE PAIN, Disp: 120 tablet, Rfl: 0 .  montelukast (SINGULAIR) 5 MG chewable tablet, Chew 1 tablet (5 mg total) by mouth at bedtime. (Patient not taking: Reported on 09/07/2016), Disp: 30 tablet, Rfl: 0   Review of Systems     Objective:   Physical Exam Vitals:   09/07/16 1702  BP: (!) 142/80  Pulse: (!) 115  SpO2: 93%  Weight: 196 lb (88.9 kg)  Height: '5\' 2"'$  (1.575 m)   Obese female barrel chest burst lip breathing chronic hoarse voice no wheeze no edema skin dry alert and oriented 3. Seems a little bit despondent compared to prior visits     Assessment:       ICD-9-CM ICD-10-CM   1. Chronic respiratory failure with hypoxia (HCC) 518.83 J96.11    799.02    2. Chronic respiratory failure with hypercapnia (HCC) 518.83 J96.12   3. Cor pulmonale (HCC) 416.9 I27.81   4. Chronic hoarseness 784.42 R49.0        Plan:      Glad you're better after  hospitalization earlier this month but too bad you are having frequent admissions and now lower than baseline  Too bad these admissions made you miss ENT appt  Glad you had flu shot    Plan - Continue oxygen 24 7 and BiPAP at night -  Continue Spiriva respimat format daily - continue Brio take it once daily - Use albuterol as needed - continue roflumilast helping you -I recommend daily prednisone at '5mg'$  per day - agree you will think about it - Re-Recommend you re-call ENT and make appt - REcommend home palliative care - agree you will think about it - talk to Judd Lien, FNP and get shingles shot; is indicated   Followup  - 6 weeks with me my NP Tammy  (> 50% of this 15 min visit spent in face to face counseling or/and coordination of care)  Dr. Brand Males, M.D., [facility]C.P Pulmonary and Critical Care Medicine Staff Physician  [facility] Pager: [phone], If no answer or between  15:00h - 7:00h: call [phone]  09/07/2016 6:29 PM

## 2016-09-07 NOTE — Patient Instructions (Addendum)
ICD-9-CM ICD-10-CM   1. Chronic respiratory failure with hypoxia (HCC) 518.83 J96.11    799.02    2. Chronic respiratory failure with hypercapnia (HCC) 518.83 J96.12   3. Cor pulmonale (HCC) 416.9 I27.81   4. Chronic hoarseness 784.42 R49.0       Glad you're better after hospitalization earlier this month but too bad you are having frequent admissions and now lower than baseline  Too bad these admissions made you miss ENT appt  Glad you had flu shot    Plan - Continue oxygen 24 7 and BiPAP at night -  Continue Spiriva respimat format daily - continue Brio take it once daily - Use albuterol as needed - continue roflumilast helping you -I recommend daily prednisone at 5mg  per day - agree you will think about it - Re-Recommend you re-call ENT and make appt - REcommend home palliative care - agree you will think about it - talk to Norina Buzzardpcp Calone, Gregory, FNP and get shingles shot; is indicated   Followup  - 6 weeks with me my NP Tammy

## 2016-09-13 ENCOUNTER — Telehealth: Payer: Self-pay

## 2016-09-13 NOTE — Telephone Encounter (Signed)
Home Health Cert/Plan of Care received (08/17/2016 - 10/15/2016) and placed on MD's desk for signature  

## 2016-09-14 NOTE — Telephone Encounter (Signed)
Paperwork signed, faxed, copy sent to scan 

## 2016-09-20 ENCOUNTER — Other Ambulatory Visit: Payer: Self-pay | Admitting: Family

## 2016-09-20 DIAGNOSIS — E1142 Type 2 diabetes mellitus with diabetic polyneuropathy: Secondary | ICD-10-CM

## 2016-09-21 NOTE — Telephone Encounter (Signed)
Last refill was 08/19/16

## 2016-09-22 NOTE — Telephone Encounter (Signed)
Rx faxed

## 2016-09-23 ENCOUNTER — Telehealth: Payer: Self-pay | Admitting: Internal Medicine

## 2016-09-23 MED ORDER — PREDNISONE 5 MG PO TABS: 5.0000 mg | ORAL_TABLET | Freq: Every day | ORAL | 1 refills | Status: DC

## 2016-09-23 NOTE — Telephone Encounter (Signed)
Called and spoke to pt. Pt states she is now requesting pred 5 mg daily per MR's request at last OV. Rx sent to preferred pharmacy. Pt verbalized understanding and denied any further questions or concerns at this time.    OV Instructions from last OV: 10.3.17 with MR  Glad you're better after hospitalization earlier this month but too bad you are having frequent admissions and now lower than baseline   Too bad these admissions made you miss ENT appt   Glad you had flu shot       Plan - Continue oxygen 24 7 and BiPAP at night -  Continue Spiriva respimat format daily - continue Brio take it once daily - Use albuterol as needed - continue roflumilast helping you -I recommend daily prednisone at 5mg  per day - agree you will think about it - Re-Recommend you re-call ENT and make appt - REcommend home palliative care - agree you will think about it - talk to Norina Buzzardpcp Calone, Gregory, FNP and get shingles shot; is indicated     Followup  - 6 weeks with me my NP Tammy    Will send to MR as FYI.

## 2016-09-23 NOTE — Telephone Encounter (Signed)
Patient states she is also not feeling well and would like this called in today - Going out of town tomorrow -pr

## 2016-09-28 DIAGNOSIS — E1140 Type 2 diabetes mellitus with diabetic neuropathy, unspecified: Secondary | ICD-10-CM | POA: Diagnosis not present

## 2016-09-28 DIAGNOSIS — I5032 Chronic diastolic (congestive) heart failure: Secondary | ICD-10-CM | POA: Diagnosis not present

## 2016-09-28 DIAGNOSIS — J441 Chronic obstructive pulmonary disease with (acute) exacerbation: Secondary | ICD-10-CM | POA: Diagnosis not present

## 2016-09-28 DIAGNOSIS — G4733 Obstructive sleep apnea (adult) (pediatric): Secondary | ICD-10-CM | POA: Diagnosis not present

## 2016-10-25 ENCOUNTER — Other Ambulatory Visit: Payer: Self-pay | Admitting: Family

## 2016-10-25 ENCOUNTER — Ambulatory Visit: Payer: 59 | Admitting: Internal Medicine

## 2016-10-25 DIAGNOSIS — E1142 Type 2 diabetes mellitus with diabetic polyneuropathy: Secondary | ICD-10-CM

## 2016-10-25 NOTE — Telephone Encounter (Signed)
Last refill was 09/21/16 

## 2016-10-26 NOTE — Telephone Encounter (Signed)
Rx faxed

## 2016-11-05 ENCOUNTER — Ambulatory Visit: Payer: 59 | Admitting: Internal Medicine

## 2016-11-09 ENCOUNTER — Other Ambulatory Visit (INDEPENDENT_AMBULATORY_CARE_PROVIDER_SITE_OTHER): Payer: 59

## 2016-11-09 ENCOUNTER — Encounter: Payer: Self-pay | Admitting: Internal Medicine

## 2016-11-09 ENCOUNTER — Ambulatory Visit (INDEPENDENT_AMBULATORY_CARE_PROVIDER_SITE_OTHER): Payer: 59 | Admitting: Internal Medicine

## 2016-11-09 VITALS — BP 110/70 | HR 91 | Ht 62.0 in | Wt 201.4 lb

## 2016-11-09 DIAGNOSIS — J9612 Chronic respiratory failure with hypercapnia: Secondary | ICD-10-CM

## 2016-11-09 DIAGNOSIS — J9611 Chronic respiratory failure with hypoxia: Secondary | ICD-10-CM | POA: Diagnosis not present

## 2016-11-09 DIAGNOSIS — J441 Chronic obstructive pulmonary disease with (acute) exacerbation: Secondary | ICD-10-CM | POA: Diagnosis not present

## 2016-11-09 LAB — CBC WITH DIFFERENTIAL/PLATELET
Basophils Absolute: 0 10*3/uL (ref 0.0–0.1)
Basophils Relative: 0.4 % (ref 0.0–3.0)
EOS PCT: 2.4 % (ref 0.0–5.0)
Eosinophils Absolute: 0.2 10*3/uL (ref 0.0–0.7)
HCT: 45.2 % (ref 36.0–46.0)
HEMOGLOBIN: 15 g/dL (ref 12.0–15.0)
Lymphocytes Relative: 15.9 % (ref 12.0–46.0)
Lymphs Abs: 1.4 10*3/uL (ref 0.7–4.0)
MCHC: 33.2 g/dL (ref 30.0–36.0)
MCV: 97.8 fl (ref 78.0–100.0)
MONO ABS: 0.7 10*3/uL (ref 0.1–1.0)
Monocytes Relative: 7.4 % (ref 3.0–12.0)
Neutro Abs: 6.6 10*3/uL (ref 1.4–7.7)
Neutrophils Relative %: 73.9 % (ref 43.0–77.0)
Platelets: 163 10*3/uL (ref 150.0–400.0)
RBC: 4.62 Mil/uL (ref 3.87–5.11)
RDW: 14.5 % (ref 11.5–15.5)
WBC: 8.9 10*3/uL (ref 4.0–10.5)

## 2016-11-09 MED ORDER — CEPHALEXIN 500 MG PO CAPS: 500.0000 mg | ORAL_CAPSULE | Freq: Three times a day (TID) | ORAL | 0 refills | Status: DC

## 2016-11-09 MED ORDER — PREDNISONE 10 MG PO TABS: ORAL_TABLET | ORAL | 0 refills | Status: DC

## 2016-11-09 NOTE — Addendum Note (Signed)
Addended by: Sheran LuzEAST, Jolicia Delira K on: 11/09/2016 11:15 AM   Modules accepted: Orders

## 2016-11-09 NOTE — Progress Notes (Signed)
Subjective:     Patient ID: Mary [name], female   DOB: 05-Nov-1976, 40 y.o.   MRN: [id number]  HPI  OV 09/07/2016  Chief Complaint  Patient presents with  . Follow-up    Pt has not seen ENT yet, due to canceling appt. Pt recently hospitalized for COPD from 9/20-9/23. Pt states her SOB is not back to baseline. Pt c/o prod cough with clear mucus and chest tightness.   40 year old female with cor pulmonale and advanced COPD with chronic hypercapnia. Presents for routine follow-up. Admitted again for CPD exacerbation. Unclear why she gets recurrent exacerbations but suspect home environment and underlying baseline very severe disease. She says she's compliant with oxygen and on the medications. She is frustrated that she got discharged early. She is chronic hoarseness of voice but has not admitted to the ENT appointment. She did meet with palliative care in the hospital and according to her she wants to think about this. She does understand the difference between home hospice versus home palliative care and she's not interested in home palliative care either at this point. She's not interested in chronic prednisone which we discussed. She says she's had a flu shot   OV 11/09/2016  Chief Complaint  Patient presents with  . Follow-up    6 week follow up. Breathing was ok until yesterday 11/08/16. Pt. felt SOB and chest tightness. Non productive cough.     Follow-up advanced COPD with cor pulmonale  This is a routine follow-up but for the last 3 days she's had increased exacerbation. She is unclear why. Worsening cough worsening wheeze mild yellow sputum. She feels fatigued. She said last night she was quite dyspneic that she wanted to go to the emergency department but opted to make this visit today.   has a past medical history of Axillary adenopathy; Chronic right-sided CHF (congestive heart failure) (03/23/2012); COPD (chronic obstructive pulmonary disease) ([place]); DVT of upper extremity (deep  vein thrombosis) [facility]) (April 2013); Exertional shortness of breath; Hepatic cirrhosis ([place]); History of alcoholism ([place]); History of chronic bronchitis; Irregular menses; Migraine; Moderate to severe pulmonary hypertension (April 2013); On home oxygen therapy; OSA (obstructive sleep apnea); Periodontal disease; Pneumonia (~ 1985; 01/2011); and Pulmonary embolus [facility]) (April 2013).   reports that she quit smoking about 4 years ago. Her smoking use included Cigarettes. She has a 25.00 pack-year smoking history. She has never used smokeless tobacco.  Past Surgical History:  Procedure Laterality Date  . APPENDECTOMY  05/03/2013  . CARDIAC CATHETERIZATION  03/2012  . CARDIAC CATHETERIZATION N/A 01/08/2016   Procedure: Right Heart Cath;  Surgeon: Jolaine Artist, MD;  Location: [place] CV LAB;  Service: Cardiovascular;  Laterality: N/A;  . CARDIAC SURGERY  1976/09/28   "my heart was backwards" (05/03/2013)  . LAPAROSCOPIC APPENDECTOMY N/A 05/03/2013   Procedure: APPENDECTOMY LAPAROSCOPIC;  Surgeon: Gwenyth Ober, MD;  Location: [place];  Service: General;  Laterality: N/A;  . LUNG SURGERY  1977[birth year]  . RIGHT HEART CATHETERIZATION N/A 03/07/2012   Procedure: RIGHT HEART CATH;  Surgeon: Burnell Blanks, MD;  Location: [facility] CATH LAB;  Service: Cardiovascular;  Laterality: N/A;    No Known Allergies  Immunization History  Administered Date(s) Administered  . Influenza Split 01/06/2011, 10/20/2012, 09/06/2013  . Influenza, High Dose Seasonal PF 07/29/2016  . Influenza,inj,Quad PF,36+ Mos 09/27/2014, 09/16/2015  . Pneumococcal Conjugate-13 09/16/2015  . Pneumococcal Polysaccharide-23 01/06/2011  . Tdap 09/06/2013    Family History  Problem Relation Age of Onset  . Hypertension  Father   . Heart disease Father     CHF; died age 29   . Alcohol abuse Father   . Other Brother     died age 109 y.o overdose     Current Outpatient Prescriptions:  .  albuterol (PROVENTIL HFA;VENTOLIN HFA) 108  (90 BASE) MCG/ACT inhaler, Inhale 2 puffs into the lungs every 6 (six) hours as needed for wheezing or shortness of breath., Disp: 8.5 g, Rfl: 1 .  blood glucose meter kit and supplies KIT, Dispense based on patient and insurance preference. Use up to four times daily as directed. (FOR ICD-9 250.00, 250.01)., Disp: 1 each, Rfl: 0 .  BREO ELLIPTA 100-25 MCG/INH AEPB, INHALE 1 PUFF INTO THE LUNGS DAILY., Disp: 60 each, Rfl: 3 .  doxycycline (VIBRA-TABS) 100 MG tablet, Take 1 tablet (100 mg total) by mouth every 12 (twelve) hours., Disp: 5 tablet, Rfl: 0 .  pantoprazole (PROTONIX) 40 MG tablet, Take 1 tablet (40 mg total) by mouth 2 (two) times daily before a meal., Disp: 60 tablet, Rfl: 0 .  potassium chloride SA (K-DUR,KLOR-CON) 20 MEQ tablet, Take 1 tablet (20 mEq total) by mouth daily., Disp: 30 tablet, Rfl: 6 .  predniSONE (DELTASONE) 5 MG tablet, Take 1 tablet (5 mg total) by mouth daily with breakfast., Disp: 30 tablet, Rfl: 1 .  pregabalin (LYRICA) 50 MG capsule, Take 1 capsule (50 mg total) by mouth 3 (three) times daily., Disp: 90 capsule, Rfl: 1 .  roflumilast (DALIRESP) 500 MCG TABS tablet, Take 500 mcg by mouth daily., Disp: , Rfl:  .  SPIRIVA RESPIMAT 1.25 MCG/ACT AERS, INHALE 2 PUFFS INTO THE LUNGS 2 (TWO) TIMES DAILY., Disp: 1 Inhaler, Rfl: 3 .  torsemide (DEMADEX) 20 MG tablet, Take 1 tablet (20 mg total) by mouth daily., Disp: 90 tablet, Rfl: 0 .  traMADol (ULTRAM) 50 MG tablet, TAKE 1 TABLET BY MOUTH EVERY 6 HOURS AS NEEDED FOR SEVERE PAIN, Disp: 120 tablet, Rfl: 0 .  montelukast (SINGULAIR) 5 MG chewable tablet, Chew 1 tablet (5 mg total) by mouth at bedtime. (Patient not taking: Reported on 11/09/2016), Disp: 30 tablet, Rfl: 0   Review of Systems     Objective:   Physical Exam  Constitutional: She is oriented to person, place, and time. She appears well-developed and well-nourished. No distress.  HENT:  Head: Normocephalic and atraumatic.  Right Ear: External ear normal.   Left Ear: External ear normal.  Mouth/Throat: Oropharynx is clear and moist. No oropharyngeal exudate.  Eyes: Conjunctivae and EOM are normal. Pupils are equal, round, and reactive to light. Right eye exhibits no discharge. Left eye exhibits no discharge. No scleral icterus.  Neck: Normal range of motion. Neck supple. No JVD present. No tracheal deviation present. No thyromegaly present.  Cardiovascular: Normal rate, regular rhythm, normal heart sounds and intact distal pulses.  Exam reveals no gallop and no friction rub.   No murmur heard. Pulmonary/Chest: Effort normal and breath sounds normal. No respiratory distress. She has no wheezes. She has no rales. She exhibits no tenderness.  Mild wheeze  Abdominal: Soft. Bowel sounds are normal. She exhibits no distension and no mass. There is no tenderness. There is no rebound and no guarding.  Visceral obesity +  Musculoskeletal: Normal range of motion. She exhibits no edema or tenderness.  Lymphadenopathy:    She has no cervical adenopathy.  Neurological: She is alert and oriented to person, place, and time. She has normal reflexes. No cranial nerve deficit. She exhibits normal muscle  tone. Coordination normal.  Skin: Skin is warm and dry. No rash noted. She is not diaphoretic. No erythema. No pallor.  Psychiatric: She has a normal mood and affect. Her behavior is normal. Judgment and thought content normal.  Vitals reviewed.  Vitals:   11/09/16 1017  BP: 110/70  Pulse: 91  SpO2: 95%  Weight: 201 lb 6.4 oz (91.4 kg)  Height: 5' 2" (1.575 m)      Assessment:       ICD-9-CM ICD-10-CM   1. COPD exacerbation ([place]) 491.21 J44.1   2. Chronic respiratory failure with hypoxia (HCC) 518.83 J96.11    799.02    3. Chronic respiratory failure with hypercapnia (HCC) 518.83 J96.12        Plan:      chjec ig3 e and cbc with diff  IM depot medrol 25m x 1 in office  Cephalexin 5032mthree times daily x 5 days  Please take Take  prednisone 4018mnce daily x 3 days, then 17m58mce daily x 3 days, then 20mg14me daily x 3 days, then prednisone 10mg 100m daily  x 3 days and then prednisone daily 5mg pe62may to continue   Continue oxygen 24 7 and BiPAP at night  Continue Spiriva respimat format daily continue Brio take it once daily Use albuterol as needed continue roflumilast helping you     Followup  - 6 weeks with me or  my NP Tammy Dr. Jammy Stlouis Brand Males F.C.C.[facility] Staff Physician [facility] Pager: [phone] answer or between  15:00h - 7:00h: call [phone]  11/09/2016 11:01 AM

## 2016-11-09 NOTE — Patient Instructions (Addendum)
ICD-9-CM ICD-10-CM   1. COPD exacerbation (HCC) 491.21 J44.1   2. Chronic respiratory failure with hypoxia (HCC) 518.83 J96.11    799.02    3. Chronic respiratory failure with hypercapnia (HCC) 518.83 J96.12    chck IgE and Cbc with diff- to see if there is room for biologic Rx to help prevent flare up  IM depot medrol 80mg  x 1 in office  Cephalexin 500mg  three times daily x 5 days  Please take Take prednisone 40mg  once daily x 3 days, then 30mg  once daily x 3 days, then 20mg  once daily x 3 days, then prednisone 10mg  once daily  x 3 days and then prednisone daily 5mg  per day to continue   Continue oxygen 24 7 and BiPAP at night  Continue Spiriva respimat format daily contniue singulair continue Brio take it once daily Use albuterol as needed continue roflumilast helping you     Followup  - 6 weeks with me or  my NP Tammy

## 2016-11-10 LAB — IGE: IGE (IMMUNOGLOBULIN E), SERUM: 18 kU/L (ref <115)

## 2016-11-20 ENCOUNTER — Other Ambulatory Visit: Payer: Self-pay | Admitting: Internal Medicine

## 2016-11-23 NOTE — Progress Notes (Signed)
Called and spoke to pt. Informed her of the results per MR. Pt verbalized understanding and denied any further questions or concerns at this time.  

## 2016-11-27 ENCOUNTER — Other Ambulatory Visit: Payer: Self-pay | Admitting: Family

## 2016-11-27 DIAGNOSIS — E1142 Type 2 diabetes mellitus with diabetic polyneuropathy: Secondary | ICD-10-CM

## 2016-11-30 ENCOUNTER — Telehealth: Payer: Self-pay | Admitting: Internal Medicine

## 2016-11-30 MED ORDER — PREDNISONE 5 MG PO TABS: 5.0000 mg | ORAL_TABLET | Freq: Every day | ORAL | 3 refills | Status: DC

## 2016-11-30 NOTE — Telephone Encounter (Signed)
Last refill was 10/26/16 

## 2016-11-30 NOTE — Telephone Encounter (Signed)
Called and spoke with pt and she is aware that rx has been sent to the pharmacy and nothing further is needed.  She stated that she will continue to monitor her oxygen levels.

## 2016-11-30 NOTE — Telephone Encounter (Signed)
Rx faxed

## 2016-12-03 ENCOUNTER — Telehealth: Payer: Self-pay | Admitting: Internal Medicine

## 2016-12-03 MED ORDER — TORSEMIDE 20 MG PO TABS: 20.0000 mg | ORAL_TABLET | Freq: Every day | ORAL | 0 refills | Status: DC

## 2016-12-03 NOTE — Telephone Encounter (Signed)
Called and spoke with pt and she is requesting a refill of the demadex. This has been sent to the pharmacy and nothing further is needed.

## 2016-12-09 DIAGNOSIS — J449 Chronic obstructive pulmonary disease, unspecified: Secondary | ICD-10-CM | POA: Diagnosis not present

## 2016-12-09 DIAGNOSIS — I270 Primary pulmonary hypertension: Secondary | ICD-10-CM | POA: Diagnosis not present

## 2016-12-09 DIAGNOSIS — G4733 Obstructive sleep apnea (adult) (pediatric): Secondary | ICD-10-CM | POA: Diagnosis not present

## 2016-12-11 DIAGNOSIS — G4733 Obstructive sleep apnea (adult) (pediatric): Secondary | ICD-10-CM | POA: Diagnosis not present

## 2016-12-11 DIAGNOSIS — J449 Chronic obstructive pulmonary disease, unspecified: Secondary | ICD-10-CM | POA: Diagnosis not present

## 2016-12-11 DIAGNOSIS — I270 Primary pulmonary hypertension: Secondary | ICD-10-CM | POA: Diagnosis not present

## 2016-12-14 ENCOUNTER — Other Ambulatory Visit: Payer: Self-pay | Admitting: Family

## 2016-12-14 ENCOUNTER — Ambulatory Visit: Payer: 59 | Admitting: Adult Health

## 2016-12-14 DIAGNOSIS — E1142 Type 2 diabetes mellitus with diabetic polyneuropathy: Secondary | ICD-10-CM

## 2016-12-16 ENCOUNTER — Ambulatory Visit (HOSPITAL_COMMUNITY)
Admission: EM | Admit: 2016-12-16 | Discharge: 2016-12-16 | Disposition: A | Discharge status: Home / Self Care | Payer: 59 | Attending: Emergency Medicine | Admitting: Emergency Medicine

## 2016-12-16 ENCOUNTER — Encounter (HOSPITAL_COMMUNITY): Payer: Self-pay | Admitting: Emergency Medicine

## 2016-12-16 DIAGNOSIS — G44209 Tension-type headache, unspecified, not intractable: Secondary | ICD-10-CM | POA: Diagnosis not present

## 2016-12-16 MED ORDER — KETOROLAC TROMETHAMINE 60 MG/2ML IM SOLN
60.0000 mg | Freq: Once | INTRAMUSCULAR | Status: AC
  Administered 2016-12-16: 60 mg via INTRAMUSCULAR

## 2016-12-16 MED ORDER — PREDNISONE 10 MG (21) PO TBPK: ORAL_TABLET | ORAL | 0 refills | Status: DC

## 2016-12-16 MED ORDER — KETOROLAC TROMETHAMINE 60 MG/2ML IM SOLN
INTRAMUSCULAR | Status: AC
  Filled 2016-12-16: qty 2

## 2016-12-16 NOTE — ED Provider Notes (Signed)
CSN: [id number]     Arrival date & time 12/16/16  1624 History   First MD Initiated Contact with Patient 12/16/16 1728     Chief Complaint  Patient presents with  . Headache   (Consider location/radiation/quality/duration/timing/severity/associated sxs/prior Treatment) Patient is here with c/o severe band like headache around her head for the last 36 hours.  She has copd and states she feels her chest getting tight.   The history is provided by the patient.  Headache  Pain location:  Generalized Quality:  Dull Severity currently:  8/10 Onset quality:  Sudden Chronicity:  New Similar to prior headaches: yes   Relieved by:  Nothing Worsened by:  Nothing Ineffective treatments:  None tried   Past Medical History:  Diagnosis Date  . Axillary adenopathy    right axillary adenopathy noted on CT chest (03/06/2012)  . Chronic right-sided CHF (congestive heart failure) 03/23/2012   with cor pulmonale. Last RHC 03/2012  . COPD (chronic obstructive pulmonary disease) ([place])    PFTS 03/08/12: fev1 0.58L/1%, FVC 1.18/33%, Ratop 49 and c/w ssevere obstruction. 21% BD response on FVC, RV 219%, DLCO 11/54%  . DVT of upper extremity (deep vein thrombosis) [facility]) April 2013   right subclavian // Unclear precipitating cause - possibly significant right heart failure, with vascular stasis potentially predisposing to clotting.    . Exertional shortness of breath   . Hepatic cirrhosis ([place])    Questionable history of - Noted on CT abdomen (03/2012) - thought to be due to vascular congestion from right heart failure +/- patient's history of alcohol abuse  . History of alcoholism ([place])   . History of chronic bronchitis   . Irregular menses   . Migraine    "~ 1/yr" (05/03/2013)  . Moderate to severe pulmonary hypertension April 2013   Cardiac cath on 03/06/12 - 1. Elevated pulmonary artery pressures, right sided filling pressures.,  2. PA: 64/45 (mean 53)    . On home oxygen therapy    "2-3 L 24/7"  (05/03/2013)  . OSA (obstructive sleep apnea)    wears noctural BiPAP (05/03/2013)  . Periodontal disease   . Pneumonia ~ 1985; 01/2011  . Pulmonary embolus [facility]) April 2013   Precipitating cause unclear. Was treated with coumadin from April-June 2013.   Past Surgical History:  Procedure Laterality Date  . APPENDECTOMY  05/03/2013  . CARDIAC CATHETERIZATION  03/2012  . CARDIAC CATHETERIZATION N/A 01/08/2016   Procedure: Right Heart Cath;  Surgeon: Jolaine Artist, MD;  Location: [place] CV LAB;  Service: Cardiovascular;  Laterality: N/A;  . CARDIAC SURGERY  19-Sep-1977   "my heart was backwards" (05/03/2013)  . LAPAROSCOPIC APPENDECTOMY N/A 05/03/2013   Procedure: APPENDECTOMY LAPAROSCOPIC;  Surgeon: Gwenyth Ober, MD;  Location: [place];  Service: General;  Laterality: N/A;  . LUNG SURGERY  June 13, 1977  . RIGHT HEART CATHETERIZATION N/A 03/07/2012   Procedure: RIGHT HEART CATH;  Surgeon: Burnell Blanks, MD;  Location: [facility] CATH LAB;  Service: Cardiovascular;  Laterality: N/A;   Family History  Problem Relation Age of Onset  . Hypertension Father   . Heart disease Father     CHF; died age 71   . Alcohol abuse Father   . Other Brother     died age 82 y.o overdose   Social History  Substance Use Topics  . Smoking status: Former Smoker    Packs/day: 1.00    Years: 25.00    Types: Cigarettes    Quit date: 02/18/2012  .  Smokeless tobacco: Never Used  . Alcohol use 1.8 oz/week    3 Shots of liquor per week     Comment: previously drinking 1 pint 3-4 days a week for 2002-2013, but in 2014, occasional drinking now    OB History    No data available     Review of Systems  Constitutional: Negative.   HENT: Negative.   Eyes: Negative.   Respiratory: Negative.   Cardiovascular: Negative.   Gastrointestinal: Negative.   Endocrine: Negative.   Genitourinary: Negative.   Musculoskeletal: Negative.   Allergic/Immunologic: Negative.   Neurological: Positive for headaches.   Hematological: Negative.   Psychiatric/Behavioral: Negative.     Allergies  Patient has no known allergies.  Home Medications   Prior to Admission medications   Medication Sig Start Date End Date Taking? Authorizing Provider  albuterol (PROVENTIL HFA;VENTOLIN HFA) 108 (90 BASE) MCG/ACT inhaler Inhale 2 puffs into the lungs every 6 (six) hours as needed for wheezing or shortness of breath. 09/06/14  Yes Brand Males, MD  BREO ELLIPTA 100-25 MCG/INH AEPB INHALE 1 PUFF INTO THE LUNGS DAILY. 06/03/16  Yes Brand Males, MD  gabapentin (NEURONTIN) 300 MG capsule TAKE 2 CAPSULES BY MOUTH 3 TIMES DAILY. 12/14/16  Yes Golden Circle, FNP  montelukast (SINGULAIR) 5 MG chewable tablet Chew 1 tablet (5 mg total) by mouth at bedtime. 08/28/16  Yes Thurnell Lose, MD  pantoprazole (PROTONIX) 40 MG tablet Take 1 tablet (40 mg total) by mouth 2 (two) times daily before a meal. 07/22/16  Yes Kelvin Cellar, MD  potassium chloride SA (K-DUR,KLOR-CON) 20 MEQ tablet Take 1 tablet (20 mEq total) by mouth daily. 01/29/16  Yes Jolaine Artist, MD  predniSONE (DELTASONE) 5 MG tablet Take 1 tablet (5 mg total) by mouth daily with breakfast. 11/30/16  Yes Brand Males, MD  pregabalin (LYRICA) 50 MG capsule Take 1 capsule (50 mg total) by mouth 3 (three) times daily. 05/31/16  Yes Golden Circle, FNP  roflumilast (DALIRESP) 500 MCG TABS tablet Take 500 mcg by mouth daily.   Yes Historical Provider, MD  SPIRIVA RESPIMAT 1.25 MCG/ACT AERS INHALE 2 PUFFS INTO THE LUNGS 2 (TWO) TIMES DAILY. 08/16/16  Yes Brand Males, MD  torsemide (DEMADEX) 20 MG tablet Take 1 tablet (20 mg total) by mouth daily. 12/03/16  Yes Brand Males, MD  traMADol (ULTRAM) 50 MG tablet TAKE 1 TABLET BY MOUTH EVERY 6 HOURS AS NEEDED FOR SEVERE PAIN 11/30/16  Yes Golden Circle, FNP  blood glucose meter kit and supplies KIT Dispense based on patient and insurance preference. Use up to four times daily as directed. (FOR ICD-9  250.00, 250.01). 04/11/16   Janece Canterbury, MD  cephALEXin (KEFLEX) 500 MG capsule Take 1 capsule (500 mg total) by mouth 3 (three) times daily. 11/09/16   Brand Males, MD  doxycycline (VIBRA-TABS) 100 MG tablet Take 1 tablet (100 mg total) by mouth every 12 (twelve) hours. 08/28/16   Thurnell Lose, MD  predniSONE (STERAPRED UNI-PAK 21 TAB) 10 MG (21) TBPK tablet Take 6-5-4-3-2-1 po qd 12/16/16   Lysbeth Penner, FNP   Meds Ordered and Administered this Visit   Medications  ketorolac (TORADOL) injection 60 mg (60 mg Intramuscular Given 12/16/16 1734)    BP 116/89 (BP Location: Right Arm)   Pulse 98   Temp 98.6 F (37 C) (Oral)   Resp 14   LMP 12/16/2016   SpO2 100%  No data found.   Physical Exam  Constitutional: She is  oriented to person, place, and time. She appears well-developed and well-nourished.  HENT:  Head: Normocephalic and atraumatic.  Right Ear: External ear normal.  Left Ear: External ear normal.  Mouth/Throat: Oropharynx is clear and moist.  Eyes: Conjunctivae and EOM are normal. Pupils are equal, round, and reactive to light.  Neck: Normal range of motion. Neck supple.  Cardiovascular: Normal rate, regular rhythm and normal heart sounds.   Pulmonary/Chest: Effort normal and breath sounds normal.  Abdominal: Soft.  Neurological: She is alert and oriented to person, place, and time.  Nursing note and vitals reviewed.   Urgent Care Course   Clinical Course     Procedures (including critical care time)  Labs Review Labs Reviewed - No data to display  Imaging Review No results found.   Visual Acuity Review  Right Eye Distance:   Left Eye Distance:   Bilateral Distance:    Right Eye Near:   Left Eye Near:    Bilateral Near:         MDM   1. Tension headache    toradol 25m IM Prednisone 142m6-5-4-3-2-1 po qd #21      WiLysbeth PennerFNP 12/16/16 1756

## 2016-12-16 NOTE — ED Triage Notes (Signed)
Here for intermittent HA onset 2 days associated w/fatigue  Hx of stage 4 COPD  Taking Tramadol w/temp relief.   Husband at bedside   A&O x4... NAD

## 2017-01-09 DIAGNOSIS — G4733 Obstructive sleep apnea (adult) (pediatric): Secondary | ICD-10-CM | POA: Diagnosis not present

## 2017-01-09 DIAGNOSIS — J449 Chronic obstructive pulmonary disease, unspecified: Secondary | ICD-10-CM | POA: Diagnosis not present

## 2017-01-09 DIAGNOSIS — I270 Primary pulmonary hypertension: Secondary | ICD-10-CM | POA: Diagnosis not present

## 2017-01-11 ENCOUNTER — Observation Stay (HOSPITAL_COMMUNITY): Payer: 59

## 2017-01-11 ENCOUNTER — Inpatient Hospital Stay (HOSPITAL_COMMUNITY)
Admission: EM | Admit: 2017-01-11 | Discharge: 2017-01-15 | DRG: 190 | Disposition: A | Discharge status: Home / Self Care | Payer: 59 | Attending: Family Medicine | Admitting: Family Medicine

## 2017-01-11 ENCOUNTER — Encounter (HOSPITAL_COMMUNITY): Payer: Self-pay

## 2017-01-11 ENCOUNTER — Emergency Department (HOSPITAL_COMMUNITY): Payer: 59

## 2017-01-11 DIAGNOSIS — J441 Chronic obstructive pulmonary disease with (acute) exacerbation: Secondary | ICD-10-CM | POA: Diagnosis not present

## 2017-01-11 DIAGNOSIS — E1165 Type 2 diabetes mellitus with hyperglycemia: Secondary | ICD-10-CM | POA: Diagnosis present

## 2017-01-11 DIAGNOSIS — R198 Other specified symptoms and signs involving the digestive system and abdomen: Secondary | ICD-10-CM

## 2017-01-11 DIAGNOSIS — Z9981 Dependence on supplemental oxygen: Secondary | ICD-10-CM

## 2017-01-11 DIAGNOSIS — Z87891 Personal history of nicotine dependence: Secondary | ICD-10-CM

## 2017-01-11 DIAGNOSIS — K219 Gastro-esophageal reflux disease without esophagitis: Secondary | ICD-10-CM | POA: Diagnosis present

## 2017-01-11 DIAGNOSIS — K7460 Unspecified cirrhosis of liver: Secondary | ICD-10-CM | POA: Diagnosis present

## 2017-01-11 DIAGNOSIS — J449 Chronic obstructive pulmonary disease, unspecified: Secondary | ICD-10-CM | POA: Diagnosis not present

## 2017-01-11 DIAGNOSIS — Z7952 Long term (current) use of systemic steroids: Secondary | ICD-10-CM

## 2017-01-11 DIAGNOSIS — J9621 Acute and chronic respiratory failure with hypoxia: Secondary | ICD-10-CM | POA: Diagnosis present

## 2017-01-11 DIAGNOSIS — R069 Unspecified abnormalities of breathing: Secondary | ICD-10-CM | POA: Diagnosis not present

## 2017-01-11 DIAGNOSIS — F1020 Alcohol dependence, uncomplicated: Secondary | ICD-10-CM | POA: Diagnosis present

## 2017-01-11 DIAGNOSIS — T380X5A Adverse effect of glucocorticoids and synthetic analogues, initial encounter: Secondary | ICD-10-CM | POA: Diagnosis present

## 2017-01-11 DIAGNOSIS — E118 Type 2 diabetes mellitus with unspecified complications: Secondary | ICD-10-CM | POA: Diagnosis not present

## 2017-01-11 DIAGNOSIS — K760 Fatty (change of) liver, not elsewhere classified: Secondary | ICD-10-CM | POA: Diagnosis present

## 2017-01-11 DIAGNOSIS — D6959 Other secondary thrombocytopenia: Secondary | ICD-10-CM | POA: Diagnosis present

## 2017-01-11 DIAGNOSIS — Z79899 Other long term (current) drug therapy: Secondary | ICD-10-CM

## 2017-01-11 DIAGNOSIS — J9692 Respiratory failure, unspecified with hypercapnia: Secondary | ICD-10-CM | POA: Diagnosis present

## 2017-01-11 DIAGNOSIS — K5900 Constipation, unspecified: Secondary | ICD-10-CM | POA: Diagnosis present

## 2017-01-11 DIAGNOSIS — R0602 Shortness of breath: Secondary | ICD-10-CM

## 2017-01-11 DIAGNOSIS — I270 Primary pulmonary hypertension: Secondary | ICD-10-CM | POA: Diagnosis not present

## 2017-01-11 DIAGNOSIS — Z86711 Personal history of pulmonary embolism: Secondary | ICD-10-CM

## 2017-01-11 DIAGNOSIS — J9622 Acute and chronic respiratory failure with hypercapnia: Secondary | ICD-10-CM | POA: Diagnosis not present

## 2017-01-11 DIAGNOSIS — G4733 Obstructive sleep apnea (adult) (pediatric): Secondary | ICD-10-CM | POA: Diagnosis not present

## 2017-01-11 DIAGNOSIS — G8929 Other chronic pain: Secondary | ICD-10-CM | POA: Diagnosis present

## 2017-01-11 DIAGNOSIS — E1140 Type 2 diabetes mellitus with diabetic neuropathy, unspecified: Secondary | ICD-10-CM | POA: Diagnosis present

## 2017-01-11 DIAGNOSIS — Z7951 Long term (current) use of inhaled steroids: Secondary | ICD-10-CM

## 2017-01-11 DIAGNOSIS — I5032 Chronic diastolic (congestive) heart failure: Secondary | ICD-10-CM | POA: Diagnosis not present

## 2017-01-11 DIAGNOSIS — E874 Mixed disorder of acid-base balance: Secondary | ICD-10-CM | POA: Diagnosis present

## 2017-01-11 DIAGNOSIS — Z86718 Personal history of other venous thrombosis and embolism: Secondary | ICD-10-CM

## 2017-01-11 DIAGNOSIS — R140 Abdominal distension (gaseous): Secondary | ICD-10-CM | POA: Diagnosis not present

## 2017-01-11 DIAGNOSIS — D72829 Elevated white blood cell count, unspecified: Secondary | ICD-10-CM | POA: Diagnosis present

## 2017-01-11 LAB — CBC
HCT: 48.3 % — ABNORMAL HIGH (ref 36.0–46.0)
HCT: 44.1 % (ref 36.0–46.0)
HEMOGLOBIN: 15.4 g/dL — AB (ref 12.0–15.0)
Hemoglobin: 14.1 g/dL (ref 12.0–15.0)
MCH: 32.8 pg (ref 26.0–34.0)
MCH: 32.6 pg (ref 26.0–34.0)
MCHC: 31.9 g/dL (ref 30.0–36.0)
MCHC: 32 g/dL (ref 30.0–36.0)
MCV: 102.8 fL — AB (ref 78.0–100.0)
MCV: 101.8 fL — ABNORMAL HIGH (ref 78.0–100.0)
PLATELETS: 130 10*3/uL — AB (ref 150–400)
PLATELETS: 122 10*3/uL — AB (ref 150–400)
RBC: 4.7 MIL/uL (ref 3.87–5.11)
RBC: 4.33 MIL/uL (ref 3.87–5.11)
RDW: 14.1 % (ref 11.5–15.5)
RDW: 14 % (ref 11.5–15.5)
WBC: 12 10*3/uL — ABNORMAL HIGH (ref 4.0–10.5)
WBC: 11 10*3/uL — ABNORMAL HIGH (ref 4.0–10.5)

## 2017-01-11 LAB — I-STAT ARTERIAL BLOOD GAS, ED
Acid-Base Excess: 14 mmol/L — ABNORMAL HIGH (ref 0.0–2.0)
BICARBONATE: 45.7 mmol/L — AB (ref 20.0–28.0)
O2 SAT: 96 %
PCO2 ART: 93.4 mmHg — AB (ref 32.0–48.0)
PH ART: 7.299 — AB (ref 7.350–7.450)
Patient temperature: 98.9
TCO2: 49 mmol/L (ref 0–100)
pO2, Arterial: 101 mmHg (ref 83.0–108.0)

## 2017-01-11 LAB — CREATININE, SERUM
CREATININE: 0.55 mg/dL (ref 0.44–1.00)
GFR calc Af Amer: >60 mL/min (ref >60)

## 2017-01-11 LAB — BASIC METABOLIC PANEL
Anion gap: 12 (ref 5–15)
BUN: 8 mg/dL (ref 6–20)
CALCIUM: 9.1 mg/dL (ref 8.9–10.3)
CHLORIDE: 90 mmol/L — AB (ref 101–111)
CO2: 37 mmol/L — AB (ref 22–32)
CREATININE: 0.47 mg/dL (ref 0.44–1.00)
GFR calc Af Amer: >60 mL/min (ref >60)
GFR calc non Af Amer: >60 mL/min (ref >60)
GLUCOSE: 126 mg/dL — AB (ref 65–99)
Potassium: 4 mmol/L (ref 3.5–5.1)
Sodium: 139 mmol/L (ref 135–145)

## 2017-01-11 LAB — CBG MONITORING, ED
GLUCOSE-CAPILLARY: 218 mg/dL — AB (ref 65–99)
Glucose-Capillary: 330 mg/dL — ABNORMAL HIGH (ref 65–99)
Glucose-Capillary: 285 mg/dL — ABNORMAL HIGH (ref 65–99)

## 2017-01-11 LAB — I-STAT VENOUS BLOOD GAS, ED
ACID-BASE EXCESS: 9 mmol/L — AB (ref 0.0–2.0)
Bicarbonate: 40.4 mmol/L — ABNORMAL HIGH (ref 20.0–28.0)
O2 SAT: 83 %
PCO2 VEN: 85.4 mmHg — AB (ref 44.0–60.0)
PO2 VEN: 56 mmHg — AB (ref 32.0–45.0)
TCO2: 43 mmol/L (ref 0–100)
pH, Ven: 7.283 (ref 7.250–7.430)

## 2017-01-11 LAB — PROCALCITONIN: PROCALCITONIN: 0.17 ng/mL

## 2017-01-11 MED ORDER — ONDANSETRON HCL 4 MG PO TABS: 4.0000 mg | ORAL_TABLET | Freq: Four times a day (QID) | ORAL | Status: DC | PRN

## 2017-01-11 MED ORDER — TIOTROPIUM BROMIDE MONOHYDRATE 18 MCG IN CAPS
18.0000 ug | ORAL_CAPSULE | Freq: Every day | RESPIRATORY_TRACT | Status: DC
  Administered 2017-01-12: 18 ug via RESPIRATORY_TRACT
  Filled 2017-01-11: qty 5

## 2017-01-11 MED ORDER — ONDANSETRON HCL 4 MG/2ML IJ SOLN: 4.0000 mg | Freq: Four times a day (QID) | INTRAMUSCULAR | Status: DC | PRN

## 2017-01-11 MED ORDER — METHYLPREDNISOLONE SODIUM SUCC 125 MG IJ SOLR
125.0000 mg | Freq: Once | INTRAMUSCULAR | Status: AC
  Administered 2017-01-11: 125 mg via INTRAVENOUS
  Filled 2017-01-11: qty 2

## 2017-01-11 MED ORDER — SODIUM CHLORIDE 0.45 % IV SOLN
INTRAVENOUS | Status: DC
  Administered 2017-01-12: 03:00:00 via INTRAVENOUS
  Administered 2017-01-13: 50 mL/h via INTRAVENOUS
  Administered 2017-01-13 – 2017-01-14 (×2): via INTRAVENOUS

## 2017-01-11 MED ORDER — GABAPENTIN 300 MG PO CAPS
600.0000 mg | ORAL_CAPSULE | Freq: Three times a day (TID) | ORAL | Status: DC
  Administered 2017-01-11 – 2017-01-15 (×11): 600 mg via ORAL
  Filled 2017-01-11 (×11): qty 2

## 2017-01-11 MED ORDER — GUAIFENESIN ER 600 MG PO TB12
1200.0000 mg | ORAL_TABLET | Freq: Two times a day (BID) | ORAL | Status: DC
  Administered 2017-01-12 – 2017-01-15 (×6): 1200 mg via ORAL
  Filled 2017-01-11 (×7): qty 2

## 2017-01-11 MED ORDER — ROFLUMILAST 500 MCG PO TABS
500.0000 ug | ORAL_TABLET | Freq: Every day | ORAL | Status: DC
  Administered 2017-01-12 – 2017-01-15 (×4): 500 ug via ORAL
  Filled 2017-01-11 (×4): qty 1

## 2017-01-11 MED ORDER — ALBUTEROL SULFATE (2.5 MG/3ML) 0.083% IN NEBU
10.0000 mg | INHALATION_SOLUTION | Freq: Once | RESPIRATORY_TRACT | Status: AC
  Administered 2017-01-11: 10 mg via RESPIRATORY_TRACT
  Filled 2017-01-11: qty 12

## 2017-01-11 MED ORDER — MONTELUKAST SODIUM 10 MG PO TABS
5.0000 mg | ORAL_TABLET | Freq: Every day | ORAL | Status: DC
  Filled 2017-01-11: qty 0.5

## 2017-01-11 MED ORDER — POLYETHYLENE GLYCOL 3350 17 G PO PACK
17.0000 g | PACK | Freq: Two times a day (BID) | ORAL | Status: DC
  Administered 2017-01-12 – 2017-01-13 (×3): 17 g via ORAL
  Filled 2017-01-11 (×5): qty 1

## 2017-01-11 MED ORDER — METHYLPREDNISOLONE SODIUM SUCC 125 MG IJ SOLR
60.0000 mg | Freq: Four times a day (QID) | INTRAMUSCULAR | Status: DC
  Administered 2017-01-11 – 2017-01-13 (×8): 60 mg via INTRAVENOUS
  Filled 2017-01-11 (×8): qty 2

## 2017-01-11 MED ORDER — IPRATROPIUM BROMIDE 0.02 % IN SOLN
0.5000 mg | Freq: Once | RESPIRATORY_TRACT | Status: AC
  Administered 2017-01-11: 0.5 mg via RESPIRATORY_TRACT

## 2017-01-11 MED ORDER — MONTELUKAST SODIUM 5 MG PO CHEW
5.0000 mg | CHEWABLE_TABLET | Freq: Every day | ORAL | Status: DC
  Administered 2017-01-11 – 2017-01-14 (×4): 5 mg via ORAL
  Filled 2017-01-11 (×6): qty 1

## 2017-01-11 MED ORDER — IPRATROPIUM BROMIDE 0.02 % IN SOLN
0.5000 mg | Freq: Once | RESPIRATORY_TRACT | Status: AC
  Administered 2017-01-11: 0.5 mg via RESPIRATORY_TRACT
  Filled 2017-01-11: qty 2.5

## 2017-01-11 MED ORDER — INSULIN ASPART 100 UNIT/ML ~~LOC~~ SOLN
0.0000 [IU] | Freq: Every day | SUBCUTANEOUS | Status: DC
  Administered 2017-01-11: 3 [IU] via SUBCUTANEOUS
  Administered 2017-01-12: 4 [IU] via SUBCUTANEOUS
  Administered 2017-01-13: 5 [IU] via SUBCUTANEOUS
  Administered 2017-01-14: 3 [IU] via SUBCUTANEOUS
  Filled 2017-01-11: qty 1

## 2017-01-11 MED ORDER — ALBUTEROL (5 MG/ML) CONTINUOUS INHALATION SOLN
10.0000 mg/h | INHALATION_SOLUTION | Freq: Once | RESPIRATORY_TRACT | Status: AC
  Administered 2017-01-11: 10 mg/h via RESPIRATORY_TRACT

## 2017-01-11 MED ORDER — HEPARIN SODIUM (PORCINE) 5000 UNIT/ML IJ SOLN
5000.0000 [IU] | Freq: Three times a day (TID) | INTRAMUSCULAR | Status: DC
  Administered 2017-01-11 – 2017-01-15 (×11): 5000 [IU] via SUBCUTANEOUS
  Filled 2017-01-11 (×11): qty 1

## 2017-01-11 MED ORDER — TORSEMIDE 20 MG PO TABS
20.0000 mg | ORAL_TABLET | Freq: Every day | ORAL | Status: DC
  Administered 2017-01-12 – 2017-01-15 (×4): 20 mg via ORAL
  Filled 2017-01-11 (×4): qty 1

## 2017-01-11 MED ORDER — TRAMADOL HCL 50 MG PO TABS
50.0000 mg | ORAL_TABLET | Freq: Four times a day (QID) | ORAL | Status: DC | PRN
  Administered 2017-01-11 – 2017-01-15 (×9): 50 mg via ORAL
  Filled 2017-01-11 (×9): qty 1

## 2017-01-11 MED ORDER — LEVOFLOXACIN IN D5W 750 MG/150ML IV SOLN
750.0000 mg | INTRAVENOUS | Status: DC
  Administered 2017-01-11 – 2017-01-15 (×5): 750 mg via INTRAVENOUS
  Filled 2017-01-11 (×5): qty 150

## 2017-01-11 MED ORDER — INSULIN ASPART 100 UNIT/ML ~~LOC~~ SOLN
0.0000 [IU] | Freq: Three times a day (TID) | SUBCUTANEOUS | Status: DC
  Administered 2017-01-11: 7 [IU] via SUBCUTANEOUS
  Administered 2017-01-12: 3 [IU] via SUBCUTANEOUS
  Administered 2017-01-12: 9 [IU] via SUBCUTANEOUS
  Administered 2017-01-12: 5 [IU] via SUBCUTANEOUS
  Administered 2017-01-13 (×2): 7 [IU] via SUBCUTANEOUS
  Administered 2017-01-14: 5 [IU] via SUBCUTANEOUS
  Administered 2017-01-14: 3 [IU] via SUBCUTANEOUS
  Administered 2017-01-15: 5 [IU] via SUBCUTANEOUS
  Administered 2017-01-15: 2 [IU] via SUBCUTANEOUS
  Filled 2017-01-11 (×3): qty 1

## 2017-01-11 MED ORDER — SENNA 8.6 MG PO TABS
2.0000 | ORAL_TABLET | Freq: Every day | ORAL | Status: DC
  Administered 2017-01-12 – 2017-01-13 (×2): 17.2 mg via ORAL
  Filled 2017-01-11 (×3): qty 2

## 2017-01-11 MED ORDER — FLUTICASONE FUROATE-VILANTEROL 100-25 MCG/INH IN AEPB
1.0000 | INHALATION_SPRAY | Freq: Every day | RESPIRATORY_TRACT | Status: DC
  Administered 2017-01-12 – 2017-01-15 (×4): 1 via RESPIRATORY_TRACT
  Filled 2017-01-11: qty 28

## 2017-01-11 NOTE — ED Notes (Signed)
Pt off of Bi=pap at this time.  Pt tolerating well.  Pt on 02 via [place] at 4.5 LPM

## 2017-01-11 NOTE — ED Notes (Signed)
cbg was 218

## 2017-01-11 NOTE — ED Triage Notes (Signed)
Pt has hx end stage COPD and has been experiencing increased SOB x 1 week. Pt states she has been using nebs and inhaler more frequently. Pt reports she just finished a large dose of steroids and is now on 5mg  Prednisone daily. Pt normally wears 3-3.5 LPM  o2 at home. She is receiving atrovent and albuterol neb at this time via EMS. NAD- VSS

## 2017-01-11 NOTE — ED Provider Notes (Addendum)
[facility]-EMERGENCY DEPT Provider Note   CSN: [id number] Arrival date & time: 01/11/17  0350  Time seen 04:15 AM   History   Chief Complaint Chief Complaint  Patient presents with  . Shortness of Breath    HPI Mary [name] is a 41 y.o. female.  HPI  patient has a history of COPD and is on oxygen at home. She reports for the past week she has been having a flareup. She states she has a mild dry cough especially when she gets short of breath, wheezing, and some clear rhinorrhea. She denies sore throat, nausea, vomiting, or diarrhea. She states the last 3 or 4 days when she uses her inhaler and nebulizer they are not always helping make her breathing better. She states she was on a higher dose prednisone taper for headaches she was having and now she's back on her maintenance dose of prednisone of 5 mg a day. Her doctor thought she was having tension headaches. She states the steroids did not really help her headache but seem to help with her breathing. She states she was last admitted in September for her COPD. She states she's never been on a ventilator.  PCP Jeanine Luz, FNP Pulmonary Dr. Marchelle Gearing  Past Medical History:  Diagnosis Date  . Axillary adenopathy    right axillary adenopathy noted on CT chest (03/06/2012)  . Chronic right-sided CHF (congestive heart failure) 03/23/2012   with cor pulmonale. Last RHC 03/2012  . COPD (chronic obstructive pulmonary disease) (HCC)    PFTS 03/08/12: fev1 0.58L/1%, FVC 1.18/33%, Ratop 49 and c/w ssevere obstruction. 21% BD response on FVC, RV 219%, DLCO 11/54%  . DVT of upper extremity (deep vein thrombosis) [facility]) April 2013   right subclavian // Unclear precipitating cause - possibly significant right heart failure, with vascular stasis potentially predisposing to clotting.    . Exertional shortness of breath   . Hepatic cirrhosis (HCC)    Questionable history of - Noted on CT abdomen (03/2012) - thought to be due to vascular congestion from  right heart failure +/- patient's history of alcohol abuse  . History of alcoholism (HCC)   . History of chronic bronchitis   . Irregular menses   . Migraine    "~ 1/yr" (05/03/2013)  . Moderate to severe pulmonary hypertension April 2013   Cardiac cath on 03/06/12 - 1. Elevated pulmonary artery pressures, right sided filling pressures.,  2. PA: 64/45 (mean 53)    . On home oxygen therapy    "2-3 L 24/7" (05/03/2013)  . OSA (obstructive sleep apnea)    wears noctural BiPAP (05/03/2013)  . Periodontal disease   . Pneumonia ~ 1985; 01/2011  . Pulmonary embolus [facility]) April 2013   Precipitating cause unclear. Was treated with coumadin from April-June 2013.    Patient Active Problem List   Diagnosis Date Noted  . Acute on chronic respiratory failure with hypoxia and hypercapnia (HCC)   . COPD (chronic obstructive pulmonary disease) with chronic bronchitis (HCC)   . Tracheomalacia   . Palliative care by specialist   . Anxiety state   . Advance directive declined by patient   . Goals of care, counseling/discussion   . Diabetes mellitus with complication (HCC)   . Chronic hoarseness 07/27/2016  . Nausea with vomiting 05/31/2016  . Diabetic neuropathy (HCC) 04/30/2016  . Diabetes mellitus type 2 in obese (HCC) 04/10/2016  . SOB (shortness of breath)   . OSA treated with BiPAP 04/02/2016  . COPD exacerbation (HCC) 04/01/2016  .  Chronic diastolic heart failure (HCC) 01/25/2016  . Chronic respiratory failure with hypoxia (HCC) 01/21/2016  . Right heart failure   . PAH (pulmonary artery hypertension)   . COPD (chronic obstructive pulmonary disease) (HCC) 12/16/2015  . Constipation 11/18/2015  . Morbid obesity (HCC) 11/18/2015  . Lung nodule 01/30/2015  . Pulmonary mass 01/13/2015  . Other emphysema (HCC)   . Gastroparesis   . Esophageal reflux   . Obstipation   . Dehydration 12/14/2014  . Abdominal pain 12/14/2014  . Fever 12/14/2014  . Dyspnea 09/18/2013  . Hypokalemia 02/28/2013    . Acute on chronic respiratory failure with hypoxia (HCC) 02/28/2013  . Financial difficulties 02/28/2013  . Obesity hypoventilation syndrome (HCC) 02/28/2013  . Elevated troponin 02/28/2013  . Myalgia 02/28/2013  . Influenza with respiratory manifestations 02/28/2013  . Anovulation 06/30/2012  . Preventative health care 05/05/2012  . Irregular menstrual cycle 04/27/2012  . Chronic right-sided CHF (congestive heart failure) 03/23/2012  . Pulmonary hypertension 03/23/2012  . Cor pulmonale (HCC) 03/21/2012  . Chronic respiratory failure with hypercapnia (HCC) 03/21/2012  . OSA on BiPAP 03/19/2012  . Axillary lymphadenopathy 03/06/2012  . Congenital heart disease 01/28/2012  . Obesity 01/28/2012  . Dental caries 01/28/2012    Past Surgical History:  Procedure Laterality Date  . APPENDECTOMY  05/03/2013  . CARDIAC CATHETERIZATION  03/2012  . CARDIAC CATHETERIZATION N/A 01/08/2016   Procedure: Right Heart Cath;  Surgeon: Dolores Patty, MD;  Location[facility] INVASIVE CV LAB;  Service: Cardiovascular;  Laterality: N/A;  . CARDIAC SURGERY  11/06/1977   "my heart was backwards" (05/03/2013)  . LAPAROSCOPIC APPENDECTOMY N/A 05/03/2013   Procedure: APPENDECTOMY LAPAROSCOPIC;  Surgeon: Cherylynn Ridges, MD;  Location: [facility] OR;  Service: General;  Laterality: N/A;  . LUNG SURGERY  01/08/77  . RIGHT HEART CATHETERIZATION N/A 03/07/2012   Procedure: RIGHT HEART CATH;  Surgeon: Kathleene Hazel, MD;  Location: [facility] CATH LAB;  Service: Cardiovascular;  Laterality: N/A;    OB History    No data available       Home Medications    Prior to Admission medications   Medication Sig Start Date End Date Taking? Authorizing Provider  albuterol (PROVENTIL HFA;VENTOLIN HFA) 108 (90 BASE) MCG/ACT inhaler Inhale 2 puffs into the lungs every 6 (six) hours as needed for wheezing or shortness of breath. 09/06/14  Yes Kalman Shan, MD  BREO ELLIPTA 100-25 MCG/INH AEPB INHALE 1 PUFF INTO THE LUNGS DAILY.  06/03/16  Yes Kalman Shan, MD  gabapentin (NEURONTIN) 300 MG capsule TAKE 2 CAPSULES BY MOUTH 3 TIMES DAILY. 12/14/16  Yes Veryl Speak, FNP  montelukast (SINGULAIR) 5 MG chewable tablet Chew 1 tablet (5 mg total) by mouth at bedtime. 08/28/16  Yes Leroy Sea, MD  pantoprazole (PROTONIX) 40 MG tablet Take 1 tablet (40 mg total) by mouth 2 (two) times daily before a meal. Patient taking differently: Take 40 mg by mouth daily.  07/22/16  Yes Jeralyn Bennett, MD  potassium chloride SA (K-DUR,KLOR-CON) 20 MEQ tablet Take 1 tablet (20 mEq total) by mouth daily. Patient taking differently: Take 20 mEq by mouth daily as needed (when takes a double dose of Torsemide).  01/29/16  Yes Dolores Patty, MD  predniSONE (DELTASONE) 5 MG tablet Take 1 tablet (5 mg total) by mouth daily with breakfast. 11/30/16  Yes Kalman Shan, MD  roflumilast (DALIRESP) 500 MCG TABS tablet Take 500 mcg by mouth daily.   Yes Historical Provider, MD  SPIRIVA RESPIMAT 1.25 MCG/ACT AERS  INHALE 2 PUFFS INTO THE LUNGS 2 (TWO) TIMES DAILY. Patient taking differently: Inhale 2 puffs into the lungs daily.  08/16/16  Yes Kalman Shan, MD  torsemide (DEMADEX) 20 MG tablet Take 1 tablet (20 mg total) by mouth daily. 12/03/16  Yes Kalman Shan, MD  traMADol (ULTRAM) 50 MG tablet TAKE 1 TABLET BY MOUTH EVERY 6 HOURS AS NEEDED FOR SEVERE PAIN Patient taking differently: Take 1 tablet three times daily 11/30/16  Yes Veryl Speak, FNP    Family History Family History  Problem Relation Age of Onset  . Hypertension Father   . Heart disease Father     CHF; died age 23   . Alcohol abuse Father   . Other Brother     died age 92 y.o overdose    Social History Social History  Substance Use Topics  . Smoking status: Former Smoker    Packs/day: 1.00    Years: 25.00    Types: Cigarettes    Quit date: 02/18/2012  . Smokeless tobacco: Never Used  . Alcohol use 1.8 oz/week    3 Shots of liquor per week      Comment: previously drinking 1 pint 3-4 days a week for 2002-2013, but in 2014, occasional drinking now   Lives with husband States she did drink last night Home oxygen 3-3-1/2 lpm [place]   Allergies   Patient has no known allergies.   Review of Systems Review of Systems  All other systems reviewed and are negative.    Physical Exam Updated Vital Signs BP 130/81 (BP Location: Right Arm)   Pulse (!) 121   Temp 98.9 F (37.2 C) (Oral)   Resp 21   Ht 5' 2.5" (1.588 m)   Wt 200 lb (90.7 kg)   LMP 12/16/2016   SpO2 (!) 88%   BMI 36.00 kg/m   Vital signs normal except for tachycardia   Physical Exam  Constitutional: She is oriented to person, place, and time. She appears well-developed and well-nourished.  Non-toxic appearance. She does not appear ill. No distress.  HENT:  Head: Normocephalic and atraumatic.  Right Ear: External ear normal.  Left Ear: External ear normal.  Nose: Nose normal. No mucosal edema or rhinorrhea.  Mouth/Throat: Oropharynx is clear and moist and mucous membranes are normal. No dental abscesses or uvula swelling.  Eyes: Conjunctivae and EOM are normal. Pupils are equal, round, and reactive to light.  Neck: Normal range of motion and full passive range of motion without pain. Neck supple.  Cardiovascular: Normal rate, regular rhythm and normal heart sounds.  Exam reveals no gallop and no friction rub.   No murmur heard. Pulmonary/Chest: Effort normal. Tachypnea noted. No respiratory distress. She has decreased breath sounds. She has no wheezes. She has no rhonchi. She has no rales. She exhibits no tenderness and no crepitus.  Patient is able to talk in 3 to four word sentences before she gets short of breath. She has marked diminished breath sounds bilaterally.  Abdominal: Soft. Normal appearance and bowel sounds are normal. She exhibits no distension. There is no tenderness. There is no rebound and no guarding.  Musculoskeletal: Normal range of motion.  She exhibits no edema or tenderness.  Moves all extremities well.   Neurological: She is alert and oriented to person, place, and time. She has normal strength. No cranial nerve deficit.  Skin: Skin is warm, dry and intact. No rash noted. No erythema. No pallor.  Psychiatric: She has a normal mood and affect. Her  speech is normal and behavior is normal. Her mood appears not anxious.  Nursing note and vitals reviewed.    ED Treatments / Results  Labs (all labs ordered are listed, but only abnormal results are displayed) Results for orders placed or performed during the hospital encounter of 01/11/17  Basic metabolic panel  Result Value Ref Range   Sodium 139 135 - 145 mmol/L   Potassium 4.0 3.5 - 5.1 mmol/L   Chloride 90 (L) 101 - 111 mmol/L   CO2 37 (H) 22 - 32 mmol/L   Glucose, Bld 126 (H) 65 - 99 mg/dL   BUN 8 6 - 20 mg/dL   Creatinine, Ser 1.61 0.44 - 1.00 mg/dL   Calcium 9.1 8.9 - 09.6 mg/dL   GFR calc non Af Amer >60 >60 mL/min   GFR calc Af Amer >60 >60 mL/min   Anion gap 12 5 - 15  CBC  Result Value Ref Range   WBC 12.0 (H) 4.0 - 10.5 K/uL   RBC 4.70 3.87 - 5.11 MIL/uL   Hemoglobin 15.4 (H) 12.0 - 15.0 g/dL   HCT 04.5 (H) 40.9 - 81.1 %   MCV 102.8 (H) 78.0 - 100.0 fL   MCH 32.8 26.0 - 34.0 pg   MCHC 31.9 30.0 - 36.0 g/dL   RDW 91.4 78.2 - 95.6 %   Platelets 130 (L) 150 - 400 K/uL  I-Stat arterial blood gas, ED  Result Value Ref Range   pH, Arterial 7.299 (L) 7.350 - 7.450   pCO2 arterial 93.4 (HH) 32.0 - 48.0 mmHg   pO2, Arterial 101.0 83.0 - 108.0 mmHg   Bicarbonate 45.7 (H) 20.0 - 28.0 mmol/L   TCO2 49 0 - 100 mmol/L   O2 Saturation 96.0 %   Acid-Base Excess 14.0 (H) 0.0 - 2.0 mmol/L   Patient temperature 98.9 F    Collection site RADIAL, ALLEN'S TEST ACCEPTABLE    Drawn by RT    Sample type ARTERIAL    Comment NOTIFIED PHYSICIAN    Laboratory interpretation all normal except Leukocytosis, acute on chronic respiratory acidosis with compensatory metabolic  alkalosis    EKG  EKG Interpretation  Date/Time:  Tuesday January 11 2017 03:56:57 EST Ventricular Rate:  119 PR Interval:    QRS Duration: 83 QT Interval:  315 QTC Calculation: 444 R Axis:   97 Text Interpretation:  Sinus tachycardia Biatrial enlargement Probable lateral infarct, old Baseline wander in lead(s) V1 V6 Since last tracing rate faster 25 Aug 2016 Confirmed by Joaquina Nissen  MD-I, Brynden Thune ([id number]) on 01/11/2017 4:34:57 AM       Radiology Dg Chest Port 1 View  Result Date: 01/11/2017 CLINICAL DATA:  Shortness of breath and wheezing tonight. History of COPD. EXAM: PORTABLE CHEST 1 VIEW COMPARISON:  08/25/2016 FINDINGS: Emphysematous changes in the lungs. Normal heart size and pulmonary vascularity. No focal airspace disease or consolidation in the lungs. No blunting of costophrenic angles. No pneumothorax. Mediastinal contours appear intact. IMPRESSION: No active disease. Electronically Signed   By: Burman Nieves M.D.   On: 01/11/2017 04:45    Procedures Procedures (including critical care time)  CRITICAL CARE Performed by: Detric Scalisi L Amillya Chavira Total critical care time: 50  minutes Critical care time was exclusive of separately billable procedures and treating other patients. Critical care was necessary to treat or prevent imminent or life-threatening deterioration. Critical care was time spent personally by me on the following activities: development of treatment plan with patient and/or surrogate as well as nursing, discussions with  consultants, evaluation of patient's response to treatment, examination of patient, obtaining history from patient or surrogate, ordering and performing treatments and interventions, ordering and review of laboratory studies, ordering and review of radiographic studies, pulse oximetry and re-evaluation of patient's condition. Care  Medications Ordered in ED Medications  albuterol (PROVENTIL) (2.5 MG/3ML) 0.083% nebulizer solution 10 mg (10 mg Nebulization  Given 01/11/17 0434)  ipratropium (ATROVENT) nebulizer solution 0.5 mg (0.5 mg Nebulization Given 01/11/17 0434)  methylPREDNISolone sodium succinate (SOLU-MEDROL) 125 mg/2 mL injection 125 mg (125 mg Intravenous Given 01/11/17 0428)  albuterol (PROVENTIL,VENTOLIN) solution continuous neb (10 mg/hr Nebulization Given 01/11/17 0546)  ipratropium (ATROVENT) nebulizer solution 0.5 mg (0.5 mg Nebulization Given 01/11/17 0547)     Initial Impression / Assessment and Plan / ED Course  I have reviewed the triage vital signs and the nursing notes.  Pertinent labs & imaging results that were available during my care of the patient were reviewed by me and considered in my medical decision making (see chart for details).    Patient was started on a continuous nebulizer treatment. She was given IV steroids.  Recheck at 5:30 AM patient has had her first continuous nebulizer. When I listen to her now I do hear some air movement and she has very short high-pitched end expiratory wheezing. A second continuous nebulizer was ordered. Also ABG was ordered.  Nurses report patient became hypoxic on her usual 3-1/2 L of nasal cannula oxygen. They reportedly did not improve when they increased it to 8 L. She was replaced on a nonrebreather mask.  6:15 AM after reviewing her ABG patient was started on BiPAP. She also was receiving her second continuous nebulizer.  Recheck 08:30 AM pt on bipap, still has decreased breath sounds. Still waiting for internist to call back for admission.  Final Clinical Impressions(s) / ED Diagnoses   Final diagnoses:  COPD exacerbation (HCC)  Acute on chronic respiratory failure with hypoxia and hypercapnia [facility](HCC)    Plan admission   Devoria AlbeIva Eveleen Mcnear, MD, Concha PyoFACEP    Matan Steen, MD 01/11/17 [id number]    Devoria AlbeIva Juan Kissoon, MD 01/11/17 [id number]

## 2017-01-11 NOTE — H&P (Addendum)
History and Physical    Mary [name] YQM:[id number] DOB: 1976/08/13 DOA: 01/11/2017  PCP: Jeanine Luz, FNP Patient coming from:   Chief Complaint: SOB  HPI: Mary [name] is a 41 y.o. female with medical history significant of advanced COPD with oxygen dependency of 3-1/2 L at baseline, DVT, alcoholism, migraines, pulmonary hypertension, pulmonary embolus, CHF, cirrhosis,.   Shortness breath with wheezing and intermittent productive cough. Started approximately 1 week ago. Initially nebulizer treatments with improvement but most recently no relief with these treatments. Patient was on a steroid taper for headaches recently which did help with symptoms however she is currently on her 5 mg daily dose. In eyes any associated fevers, chest pain, palpitations, dysuria, frequency, abdominal pain, diarrhea, vomiting, neck stiffness, lower back pain. Patient does endorse some sensation of fullness in her abdomen which is nonspecific and fairly constant. Patient endorses infrequent bowel movements and needing MiraLAX for stooling.   ED Course: Patient noted to be hypoxic on her home 3 and half liters of nasal cannula into the high 70s. Placed on BiPAP with improvement.  Review of Systems: As per HPI otherwise 10 point review of systems negative.   Ambulatory Status: requires O2 for ambulation  Past Medical History:  Diagnosis Date  . Axillary adenopathy    right axillary adenopathy noted on CT chest (03/06/2012)  . Chronic right-sided CHF (congestive heart failure) 03/23/2012   with cor pulmonale. Last RHC 03/2012  . COPD (chronic obstructive pulmonary disease) (HCC)    PFTS 03/08/12: fev1 0.58L/1%, FVC 1.18/33%, Ratop 49 and c/w ssevere obstruction. 21% BD response on FVC, RV 219%, DLCO 11/54%  . DVT of upper extremity (deep vein thrombosis) [facility]) April 2013   right subclavian // Unclear precipitating cause - possibly significant right heart failure, with vascular stasis potentially  predisposing to clotting.    . Exertional shortness of breath   . Hepatic cirrhosis (HCC)    Questionable history of - Noted on CT abdomen (03/2012) - thought to be due to vascular congestion from right heart failure +/- patient's history of alcohol abuse  . History of alcoholism (HCC)   . History of chronic bronchitis   . Irregular menses   . Migraine    "~ 1/yr" (05/03/2013)  . Moderate to severe pulmonary hypertension April 2013   Cardiac cath on 03/06/12 - 1. Elevated pulmonary artery pressures, right sided filling pressures.,  2. PA: 64/45 (mean 53)    . On home oxygen therapy    "2-3 L 24/7" (05/03/2013)  . OSA (obstructive sleep apnea)    wears noctural BiPAP (05/03/2013)  . Periodontal disease   . Pneumonia ~ 1985; 01/2011  . Pulmonary embolus [facility]) April 2013   Precipitating cause unclear. Was treated with coumadin from April-June 2013.    Past Surgical History:  Procedure Laterality Date  . APPENDECTOMY  05/03/2013  . CARDIAC CATHETERIZATION  03/2012  . CARDIAC CATHETERIZATION N/A 01/08/2016   Procedure: Right Heart Cath;  Surgeon: Dolores Patty, MD;  Location: [facility] INVASIVE CV LAB;  Service: Cardiovascular;  Laterality: N/A;  . CARDIAC SURGERY  [birth year]01-1976   "my heart was backwards" (05/03/2013)  . LAPAROSCOPIC APPENDECTOMY N/A 05/03/2013   Procedure: APPENDECTOMY LAPAROSCOPIC;  Surgeon: Cherylynn Ridges, MD;  Location: [facility] OR;  Service: General;  Laterality: N/A;  . LUNG SURGERY  05-17-77  . RIGHT HEART CATHETERIZATION N/A 03/07/2012   Procedure: RIGHT HEART CATH;  Surgeon: Kathleene Hazel, MD;  Location: [facility] CATH LAB;  Service: Cardiovascular;  Laterality:  N/A;    Social History   Social History  . Marital status: Married    Spouse name: N/A  . Number of children: 0  . Years of education: 14   Occupational History  . Unemployed [employer]   Social History Main Topics  . Smoking status: Former Smoker    Packs/day: 1.00    Years: 25.00     Types: Cigarettes    Quit date: 02/18/2012  . Smokeless tobacco: Never Used  . Alcohol use 1.8 oz/week    3 Shots of liquor per week     Comment: previously drinking 1 pint 3-4 days a week for 2002-2013, but in 2014, occasional drinking now   . Drug use: No  . Sexual activity: Yes    Partners: Male   Other Topics Concern  . Not on file   Social History Narrative   Lives with husband in [place]   Has a farm with multiple animals including chickens, [place] pigs, cockatil (bird), dogs and cats   Completed GED and 2 years of college   Smoking-quit smoking >1 year since 2014   Not working    Fun: Land with her dog and go fishing    No Known Allergies  Family History  Problem Relation Age of Onset  . Hypertension Father   . Heart disease Father     CHF; died age 74   . Alcohol abuse Father   . Other Brother     died age 46 y.o overdose    Prior to Admission medications   Medication Sig Start Date End Date Taking? Authorizing Provider  albuterol (PROVENTIL HFA;VENTOLIN HFA) 108 (90 BASE) MCG/ACT inhaler Inhale 2 puffs into the lungs every 6 (six) hours as needed for wheezing or shortness of breath. 09/06/14  Yes Kalman Shan, MD  BREO ELLIPTA 100-25 MCG/INH AEPB INHALE 1 PUFF INTO THE LUNGS DAILY. 06/03/16  Yes Kalman Shan, MD  gabapentin (NEURONTIN) 300 MG capsule TAKE 2 CAPSULES BY MOUTH 3 TIMES DAILY. 12/14/16  Yes Veryl Speak, FNP  montelukast (SINGULAIR) 5 MG chewable tablet Chew 1 tablet (5 mg total) by mouth at bedtime. 08/28/16  Yes Leroy Sea, MD  pantoprazole (PROTONIX) 40 MG tablet Take 1 tablet (40 mg total) by mouth 2 (two) times daily before a meal. Patient taking differently: Take 40 mg by mouth daily.  07/22/16  Yes Jeralyn Bennett, MD  potassium chloride SA (K-DUR,KLOR-CON) 20 MEQ tablet Take 1 tablet (20 mEq total) by mouth daily. Patient taking differently: Take 20 mEq by mouth daily as needed (when takes a double dose of Torsemide).   01/29/16  Yes Dolores Patty, MD  predniSONE (DELTASONE) 5 MG tablet Take 1 tablet (5 mg total) by mouth daily with breakfast. 11/30/16  Yes Kalman Shan, MD  roflumilast (DALIRESP) 500 MCG TABS tablet Take 500 mcg by mouth daily.   Yes Historical Provider, MD  SPIRIVA RESPIMAT 1.25 MCG/ACT AERS INHALE 2 PUFFS INTO THE LUNGS 2 (TWO) TIMES DAILY. Patient taking differently: Inhale 2 puffs into the lungs daily.  08/16/16  Yes Kalman Shan, MD  torsemide (DEMADEX) 20 MG tablet Take 1 tablet (20 mg total) by mouth daily. 12/03/16  Yes Kalman Shan, MD  traMADol (ULTRAM) 50 MG tablet TAKE 1 TABLET BY MOUTH EVERY 6 HOURS AS NEEDED FOR SEVERE PAIN Patient taking differently: Take 1 tablet three times daily 11/30/16  Yes Veryl Speak, FNP    Physical Exam: Vitals:  01/11/17 0945 01/11/17 1000 01/11/17 1015 01/11/17 1030  BP: 119/65 120/67 118/62 112/67  Pulse: 113 108 107 104  Resp: 19 23 21 19   Temp:      TempSrc:      SpO2: 96% 97% 96% 96%  Weight:      Height:         General: There is mildly anxious. Resting in bed. Eyes:  PERRL, EOMI, normal lids, iris ENT:  grossly normal hearing, lips & tongue, mmm Neck:  no LAD, masses or thyromegaly Cardiovascular: Tachycardic, RRR, no m/r/g. No LE edema.  Respiratory: Decreased air movement, diffuse wheezing with intermittent rhonchi, on BiPAP, increased effort. Abdomen:  soft, ntnd, NABS Skin:  no rash or induration seen on limited exam Musculoskeletal:  grossly normal tone BUE/BLE, good ROM, no bony abnormality Psychiatric:  grossly normal mood and affect, speech fluent and appropriate, AOx3 Neurologic:  CN 2-12 grossly intact, moves all extremities in coordinated fashion, sensation intact  Labs on Admission: I have personally reviewed following labs and imaging studies  CBC:  Recent Labs Lab 01/11/17 0355  WBC 12.0*  HGB 15.4*  HCT 48.3*  MCV 102.8*  PLT 130*   Basic Metabolic Panel:  Recent Labs Lab  01/11/17 0355  NA 139  K 4.0  CL 90*  CO2 37*  GLUCOSE 126*  BUN 8  CREATININE 0.47  CALCIUM 9.1   GFR: Estimated Creatinine Clearance: 100 mL/min (by C-G formula based on SCr of 0.47 mg/dL). Liver Function Tests: No results for input(s): AST, ALT, ALKPHOS, BILITOT, PROT, ALBUMIN in the last 168 hours. No results for input(s): LIPASE, AMYLASE in the last 168 hours. No results for input(s): AMMONIA in the last 168 hours. Coagulation Profile: No results for input(s): INR, PROTIME in the last 168 hours. Cardiac Enzymes: No results for input(s): CKTOTAL, CKMB, CKMBINDEX, TROPONINI in the last 168 hours. BNP (last 3 results)  Recent Labs  04/20/16 1703  PROBNP 30.0   HbA1C: No results for input(s): HGBA1C in the last 72 hours. CBG: No results for input(s): GLUCAP in the last 168 hours. Lipid Profile: No results for input(s): CHOL, HDL, LDLCALC, TRIG, CHOLHDL, LDLDIRECT in the last 72 hours. Thyroid Function Tests: No results for input(s): TSH, T4TOTAL, FREET4, T3FREE, THYROIDAB in the last 72 hours. Anemia Panel: No results for input(s): VITAMINB12, FOLATE, FERRITIN, TIBC, IRON, RETICCTPCT in the last 72 hours. Urine analysis:    Component Value Date/Time   COLORURINE YELLOW 07/25/2015 1400   APPEARANCEUR HAZY (A) 07/25/2015 1400   LABSPEC 1.020 07/25/2015 1400   PHURINE 7.0 07/25/2015 1400   GLUCOSEU NEGATIVE 07/25/2015 1400   HGBUR LARGE (A) 07/25/2015 1400   BILIRUBINUR NEGATIVE 07/25/2015 1400   KETONESUR NEGATIVE 07/25/2015 1400   PROTEINUR NEGATIVE 07/25/2015 1400   UROBILINOGEN 1.0 07/25/2015 1400   NITRITE NEGATIVE 07/25/2015 1400   LEUKOCYTESUR NEGATIVE 07/25/2015 1400    Creatinine Clearance: Estimated Creatinine Clearance: 100 mL/min (by C-G formula based on SCr of 0.47 mg/dL).  Sepsis Labs: @LABRCNTIP (procalcitonin:4,lacticidven:4) )No results found for this or any previous visit (from the past 240 hour(s)).   Radiological Exams on Admission: Dg  Chest Port 1 View  Result Date: 01/11/2017 CLINICAL DATA:  Shortness of breath and wheezing tonight. History of COPD. EXAM: PORTABLE CHEST 1 VIEW COMPARISON:  08/25/2016 FINDINGS: Emphysematous changes in the lungs. Normal heart size and pulmonary vascularity. No focal airspace disease or consolidation in the lungs. No blunting of costophrenic angles. No pneumothorax. Mediastinal contours appear intact. IMPRESSION: No active disease.  Electronically Signed   By: Burman Nieves M.D.   On: 01/11/2017 04:45    EKG: Independently reviewed. Sinus, tachicardic, no ACS  Assessment/Plan Active Problems:   COPD (chronic obstructive pulmonary disease) (HCC)   Chronic diastolic heart failure (HCC)   COPD exacerbation (HCC)   Diabetic neuropathy (HCC)   Diabetes mellitus with complication (HCC)   Acute on chronic respiratory failure with hypoxia and hypercapnia (HCC)   Respiratory failure with hypercapnia (HCC)   Acute on chronic respiratory failure with marked hypercapnia and hypoxemia. Improved on BiPAP. Baseline 3/2 L nasal cannula. ABG showing pH 7.29, PCO2 93, PaO2 101, bicarbonate 49. Patient is a chronic CO2 retainer but this is the highest she has ever been. - Duonebs Q4/Q2prn - Solumedrol 60 Q6 - Levaquin - Wean off bipap when able - Continue Daliresp, Breo, Spiriva - Procalcitonin, respiratory viral panel  Chronic diastolic congestive heart failure: Possible mild exacerbation given exam findings and CXR though BNP 23.4. Last Echo showing EF 55% grade 1 diastolic dysfunction.  - Daily weights, strict I's and O's - continue torsemide  GERD; - continue ppi  Chronic pain: - continue tramadol, Neurontin  Diabetes: Anticipate hyperglycemia given steroid dosing - SSI  Abd fullness: likely from constipation - KUB - scheduled bowel regimen   DVT prophylaxis: Hep  Code Status: FULL  Family Communication: none  Disposition Plan: pending improvementy in respiratory status    Consults called: none  Admission status: obs - SDU    Croy Drumwright J MD [facility]  If 7PM-7AM, please contact night-coverage [email or web address] Password [email or web address]  01/11/2017, 10:58 AM

## 2017-01-11 NOTE — Progress Notes (Signed)
Pharmacy Antibiotic Note  Mary HakimMichelle L [name] is a 41 y.o. female admitted on 01/11/2017 with COPD.  Pharmacy has been consulted for levaquin dosing. Pt is afebrile and WBC is elevated at 12. SCr is WNL.   Plan: Levaquin 750mg  IV Q24H F/u renal fxn, C&S, clinical status  *Pharmacy will sign off as no dose adjustments are anticipated. Thank you for the consult!  Height: 5' 2.5" (158.8 cm) Weight: 200 lb (90.7 kg) IBW/kg (Calculated) : 51.25  Temp (24hrs), Avg:98.9 F (37.2 C), Min:98.9 F (37.2 C), Max:98.9 F (37.2 C)   Recent Labs Lab 01/11/17 0355  WBC 12.0*  CREATININE 0.47    Estimated Creatinine Clearance: 100 mL/min (by C-G formula based on SCr of 0.47 mg/dL).    No Known Allergies  Antimicrobials this admission: Levaquin 2/6>>  Dose adjustments this admission: N/A  Microbiology results: Pending  Thank you for allowing pharmacy to be a part of this patient's care.  Mary [name], Mary [name] 01/11/2017 11:03 AM

## 2017-01-11 NOTE — ED Notes (Signed)
Dr Craige CottaKirby paged to add tramadol daily medication

## 2017-01-11 NOTE — ED Notes (Signed)
Pt ate 100% of supper tray

## 2017-01-11 NOTE — ED Notes (Signed)
[place] sandwich given to pt.  Supper tray ordered for pt.

## 2017-01-11 NOTE — ED Notes (Signed)
Pt 02 84% on 3.5lpm- [place] increased to 5llpm with no change.  Pt then placed on NRB @ 10lpm. Pt o2 now 97%. Pt reassessed after breathing tx with no improvement in lung sounds. Pt lung sounds very diminished bilaterally. Very little air movement after tx. Dr. Lynelle DoctorKnapp aware

## 2017-01-11 NOTE — ED Notes (Signed)
PT up to bedside commode- SpO2 dropped to 77% on 3.5LPM and HR increased to 132. Pt tachypenic but returned to baseline soon after back in bed

## 2017-01-12 DIAGNOSIS — E1165 Type 2 diabetes mellitus with hyperglycemia: Secondary | ICD-10-CM | POA: Diagnosis present

## 2017-01-12 DIAGNOSIS — D6959 Other secondary thrombocytopenia: Secondary | ICD-10-CM | POA: Diagnosis present

## 2017-01-12 DIAGNOSIS — Z87891 Personal history of nicotine dependence: Secondary | ICD-10-CM | POA: Diagnosis not present

## 2017-01-12 DIAGNOSIS — K5900 Constipation, unspecified: Secondary | ICD-10-CM | POA: Diagnosis present

## 2017-01-12 DIAGNOSIS — K219 Gastro-esophageal reflux disease without esophagitis: Secondary | ICD-10-CM | POA: Diagnosis present

## 2017-01-12 DIAGNOSIS — J9621 Acute and chronic respiratory failure with hypoxia: Secondary | ICD-10-CM

## 2017-01-12 DIAGNOSIS — R0602 Shortness of breath: Secondary | ICD-10-CM | POA: Diagnosis present

## 2017-01-12 DIAGNOSIS — Z79899 Other long term (current) drug therapy: Secondary | ICD-10-CM | POA: Diagnosis not present

## 2017-01-12 DIAGNOSIS — T380X5A Adverse effect of glucocorticoids and synthetic analogues, initial encounter: Secondary | ICD-10-CM | POA: Diagnosis present

## 2017-01-12 DIAGNOSIS — G4733 Obstructive sleep apnea (adult) (pediatric): Secondary | ICD-10-CM | POA: Diagnosis present

## 2017-01-12 DIAGNOSIS — Z9981 Dependence on supplemental oxygen: Secondary | ICD-10-CM | POA: Diagnosis not present

## 2017-01-12 DIAGNOSIS — Z7952 Long term (current) use of systemic steroids: Secondary | ICD-10-CM | POA: Diagnosis not present

## 2017-01-12 DIAGNOSIS — J9622 Acute and chronic respiratory failure with hypercapnia: Secondary | ICD-10-CM

## 2017-01-12 DIAGNOSIS — J42 Unspecified chronic bronchitis: Secondary | ICD-10-CM | POA: Diagnosis not present

## 2017-01-12 DIAGNOSIS — F1020 Alcohol dependence, uncomplicated: Secondary | ICD-10-CM | POA: Diagnosis present

## 2017-01-12 DIAGNOSIS — Z86711 Personal history of pulmonary embolism: Secondary | ICD-10-CM | POA: Diagnosis not present

## 2017-01-12 DIAGNOSIS — G8929 Other chronic pain: Secondary | ICD-10-CM | POA: Diagnosis present

## 2017-01-12 DIAGNOSIS — I5032 Chronic diastolic (congestive) heart failure: Secondary | ICD-10-CM | POA: Diagnosis not present

## 2017-01-12 DIAGNOSIS — D72829 Elevated white blood cell count, unspecified: Secondary | ICD-10-CM | POA: Diagnosis present

## 2017-01-12 DIAGNOSIS — K760 Fatty (change of) liver, not elsewhere classified: Secondary | ICD-10-CM | POA: Diagnosis present

## 2017-01-12 DIAGNOSIS — K7460 Unspecified cirrhosis of liver: Secondary | ICD-10-CM | POA: Diagnosis present

## 2017-01-12 DIAGNOSIS — Z7951 Long term (current) use of inhaled steroids: Secondary | ICD-10-CM | POA: Diagnosis not present

## 2017-01-12 DIAGNOSIS — Z86718 Personal history of other venous thrombosis and embolism: Secondary | ICD-10-CM | POA: Diagnosis not present

## 2017-01-12 DIAGNOSIS — J441 Chronic obstructive pulmonary disease with (acute) exacerbation: Secondary | ICD-10-CM | POA: Diagnosis not present

## 2017-01-12 DIAGNOSIS — E1140 Type 2 diabetes mellitus with diabetic neuropathy, unspecified: Secondary | ICD-10-CM | POA: Diagnosis present

## 2017-01-12 DIAGNOSIS — E874 Mixed disorder of acid-base balance: Secondary | ICD-10-CM | POA: Diagnosis not present

## 2017-01-12 LAB — RESPIRATORY PANEL BY PCR
Adenovirus: NOT DETECTED
BORDETELLA PERTUSSIS-RVPCR: NOT DETECTED
CHLAMYDOPHILA PNEUMONIAE-RVPPCR: NOT DETECTED
CORONAVIRUS NL63-RVPPCR: NOT DETECTED
Coronavirus 229E: NOT DETECTED
Coronavirus HKU1: NOT DETECTED
Coronavirus OC43: NOT DETECTED
INFLUENZA A-RVPPCR: NOT DETECTED
Influenza B: NOT DETECTED
MYCOPLASMA PNEUMONIAE-RVPPCR: NOT DETECTED
Metapneumovirus: NOT DETECTED
PARAINFLUENZA VIRUS 4-RVPPCR: NOT DETECTED
Parainfluenza Virus 1: NOT DETECTED
Parainfluenza Virus 2: NOT DETECTED
Parainfluenza Virus 3: NOT DETECTED
Respiratory Syncytial Virus: NOT DETECTED
Rhinovirus / Enterovirus: NOT DETECTED

## 2017-01-12 LAB — CBG MONITORING, ED
GLUCOSE-CAPILLARY: 236 mg/dL — AB (ref 65–99)
Glucose-Capillary: 239 mg/dL — ABNORMAL HIGH (ref 65–99)
Glucose-Capillary: 267 mg/dL — ABNORMAL HIGH (ref 65–99)
Glucose-Capillary: 468 mg/dL — ABNORMAL HIGH (ref 65–99)

## 2017-01-12 LAB — CBC
HCT: 43.5 % (ref 36.0–46.0)
Hemoglobin: 14 g/dL (ref 12.0–15.0)
MCH: 32.6 pg (ref 26.0–34.0)
MCHC: 32.2 g/dL (ref 30.0–36.0)
MCV: 101.4 fL — ABNORMAL HIGH (ref 78.0–100.0)
PLATELETS: 128 10*3/uL — AB (ref 150–400)
RBC: 4.29 MIL/uL (ref 3.87–5.11)
RDW: 14.1 % (ref 11.5–15.5)
WBC: 20.6 10*3/uL — ABNORMAL HIGH (ref 4.0–10.5)

## 2017-01-12 LAB — BASIC METABOLIC PANEL
Anion gap: 12 (ref 5–15)
BUN: 11 mg/dL (ref 6–20)
CALCIUM: 9.4 mg/dL (ref 8.9–10.3)
CO2: 35 mmol/L — ABNORMAL HIGH (ref 22–32)
Chloride: 87 mmol/L — ABNORMAL LOW (ref 101–111)
Creatinine, Ser: 0.48 mg/dL (ref 0.44–1.00)
GFR calc Af Amer: >60 mL/min (ref >60)
GLUCOSE: 238 mg/dL — AB (ref 65–99)
Potassium: 4.3 mmol/L (ref 3.5–5.1)
Sodium: 134 mmol/L — ABNORMAL LOW (ref 135–145)

## 2017-01-12 LAB — GLUCOSE, CAPILLARY
Glucose-Capillary: 260 mg/dL — ABNORMAL HIGH (ref 65–99)
Glucose-Capillary: 312 mg/dL — ABNORMAL HIGH (ref 65–99)

## 2017-01-12 LAB — MRSA PCR SCREENING: MRSA by PCR: NEGATIVE

## 2017-01-12 MED ORDER — IPRATROPIUM-ALBUTEROL 0.5-2.5 (3) MG/3ML IN SOLN
3.0000 mL | Freq: Four times a day (QID) | RESPIRATORY_TRACT | Status: DC | PRN
  Administered 2017-01-12: 3 mL via RESPIRATORY_TRACT
  Filled 2017-01-12: qty 3

## 2017-01-12 MED ORDER — INSULIN ASPART 100 UNIT/ML ~~LOC~~ SOLN
5.0000 [IU] | Freq: Once | SUBCUTANEOUS | Status: AC
  Administered 2017-01-12: 5 [IU] via SUBCUTANEOUS
  Filled 2017-01-12: qty 1

## 2017-01-12 NOTE — ED Notes (Signed)
PT CBG 468

## 2017-01-12 NOTE — Progress Notes (Signed)
RT set up BIPAP in patient's room. Patient stated she would call when ready to go on BIPAP for the night. RT will monitor as needed.

## 2017-01-12 NOTE — ED Notes (Signed)
Pt requesting to have toast & sausage for breakfast & fruit cup, this RN ordered the items, & pt updated

## 2017-01-12 NOTE — Progress Notes (Addendum)
PROGRESS NOTE  Mary [name]:[id number] DOB: 11/08/1976 DOA: 01/11/2017 PCP: Jeanine Luz, FNP   LOS: 0 days   Brief Narrative: This is a 41 y.o female who presented to the ED yesterday for worsening symptoms of SOB and confusion for the past few weeks. PMH significant for advanced COPD with O2 dependency of 3- 3.5L O2 at home, DVT, alcoholism, migraines, pulmonary hypertension, pulmonary embolus and CHF with signs of fatty liver disease.   Assessment & Plan: Active Problems:   COPD (chronic obstructive pulmonary disease) (HCC)   Chronic diastolic heart failure (HCC)   COPD exacerbation (HCC)   Diabetic neuropathy (HCC)   Diabetes mellitus with complication (HCC)   Acute on chronic respiratory failure with hypoxia and hypercapnia (HCC)   Respiratory failure with hypercapnia (HCC)  Acute on chronic respiratory failure with marked hypercapnia and hypoxemia - Likely due to COPD exacerbation, she is off BiPAP this morning, continue BiPAP PRN.  - Agree with SDU admission  - ABG showed  significant hypercarbia and respiratory acidosis pH 7.29, PCO2 93, PaO2 101, bicarbonate 49. We'll repeat ABG tomorrow morning - she has a chronic degree of CO2 retention - Continue antibiotics, steroids as well as breathing treatments - She is on nasal cannula as morning, satting mid 80s to 90% - respiratory virus panel negative  Leukocytosis - Monitor, likely due to steroids  Chronic diastolic congestive heart failure - Echo on 08/27/16 shows Ef: 65-70% with no wall motion abnormalities.  - Patient appears to be euvolemic  GERD - continue ppi  Type II Diabetes Mellitus - A1C pending - CBGs elevated possibly due to steroids, monitor closely - SSI  Alcohol use - Last drink about 3 days ago, no signs of withdrawals, counseled for cessation  Thrombocytopenia  - Likely due to fatty liver disease/alcohol abuse   DVT prophylaxis: Heparin  Code Status: Full Family Communication:  None at bedside. Disposition Plan: SDU. Floor in am if not using BiPAP overnight  Consultants:   None  Procedures:   None  Antimicrobials:  Levaquin.  Subjective: - Feeling better, still short of breath however appreciates improvement  Objective: Vitals:   01/12/17 0700 01/12/17 0730 01/12/17 0810 01/12/17 0833  BP: 110/78 105/74 102/71   Pulse: (!) 57 (!) 57 71 76  Resp: 21 14 19 15   Temp:      TempSrc:      SpO2: (!) 89% 90% 91% 92%  Weight:      Height:        Intake/Output Summary (Last 24 hours) at 01/12/17 1055 Last data filed at 01/11/17 1500  Gross per 24 hour  Intake              250 ml  Output                0 ml  Net              250 ml   Filed Weights   01/11/17 0359  Weight: 90.7 kg (200 lb)    Examination: Constitutional: NAD Vitals:   01/12/17 0700 01/12/17 0730 01/12/17 0810 01/12/17 0833  BP: 110/78 105/74 102/71   Pulse: (!) 57 (!) 57 71 76  Resp: 21 14 19 15   Temp:      TempSrc:      SpO2: (!) 89% 90% 91% 92%  Weight:      Height:       Eyes: Lids and conjunctivae normal ENMT: Mucous membranes are moist.  Neck: normal, supple,  no masses. Respiratory: Increased respiratory effort. Diffuse diminished breath sounds with expiratory wheezes.  Cardiovascular: Regular rate and rhythm, no murmurs / rubs / gallops. No LE edema.  Abdomen: no tenderness. Bowel sounds positive.  Musculoskeletal:  No joint deformity upper and lower extremities. No contractures. Normal muscle tone.  Neurologic: Non focal. Psychiatric: Normal judgment and insight. Alert and oriented x 3. Normal mood.    Data Reviewed: I have personally reviewed following labs and imaging studies  CBC:  Recent Labs Lab 01/11/17 0355 01/11/17 1545 01/12/17 0208  WBC 12.0* 11.0* 20.6*  HGB 15.4* 14.1 14.0  HCT 48.3* 44.1 43.5  MCV 102.8* 101.8* 101.4*  PLT 130* 122* 128*   Basic Metabolic Panel:  Recent Labs Lab 01/11/17 0355 01/11/17 1545 01/12/17 0208  NA 139   --  134*  K 4.0  --  4.3  CL 90*  --  87*  CO2 37*  --  35*  GLUCOSE 126*  --  238*  BUN 8  --  11  CREATININE 0.47 0.55 0.48  CALCIUM 9.1  --  9.4   BNP (last 3 results)  Recent Labs  04/20/16 1703  PROBNP 30.0   HbA1C: No results for input(s): HGBA1C in the last 72 hours. CBG:  Recent Labs Lab 01/11/17 1722 01/11/17 2213 01/12/17 0231 01/12/17 0809 01/12/17 1023  GLUCAP 330* 285* 239* 267* 468*   Lipid Profile: No results for input(s): CHOL, HDL, LDLCALC, TRIG, CHOLHDL, LDLDIRECT in the last 72 hours. Thyroid Function Tests: No results for input(s): TSH, T4TOTAL, FREET4, T3FREE, THYROIDAB in the last 72 hours. Anemia Panel: No results for input(s): VITAMINB12, FOLATE, FERRITIN, TIBC, IRON, RETICCTPCT in the last 72 hours. Urine analysis:    Component Value Date/Time   COLORURINE YELLOW 07/25/2015 1400   APPEARANCEUR HAZY (A) 07/25/2015 1400   LABSPEC 1.020 07/25/2015 1400   PHURINE 7.0 07/25/2015 1400   GLUCOSEU NEGATIVE 07/25/2015 1400   HGBUR LARGE (A) 07/25/2015 1400   BILIRUBINUR NEGATIVE 07/25/2015 1400   KETONESUR NEGATIVE 07/25/2015 1400   PROTEINUR NEGATIVE 07/25/2015 1400   UROBILINOGEN 1.0 07/25/2015 1400   NITRITE NEGATIVE 07/25/2015 1400   LEUKOCYTESUR NEGATIVE 07/25/2015 1400   Sepsis Labs: Invalid input(s): PROCALCITONIN, LACTICIDVEN  No results found for this or any previous visit (from the past 240 hour(s)).    Radiology Studies: Dg Chest Port 1 View  Result Date: 01/11/2017 CLINICAL DATA:  Shortness of Breath EXAM: PORTABLE CHEST 1 VIEW COMPARISON:  01/11/2017 FINDINGS: Hyperinflation of the lungs. There is mild cardiomegaly. No confluent opacities, effusions or edema. No acute bony abnormality. IMPRESSION: Hyperinflation, mild cardiomegaly. Electronically Signed   By: Charlett Nose M.D.   On: 01/11/2017 12:21   Dg Chest Port 1 View  Result Date: 01/11/2017 CLINICAL DATA:  Shortness of breath and wheezing tonight. History of COPD. EXAM:  PORTABLE CHEST 1 VIEW COMPARISON:  08/25/2016 FINDINGS: Emphysematous changes in the lungs. Normal heart size and pulmonary vascularity. No focal airspace disease or consolidation in the lungs. No blunting of costophrenic angles. No pneumothorax. Mediastinal contours appear intact. IMPRESSION: No active disease. Electronically Signed   By: Burman Nieves M.D.   On: 01/11/2017 04:45   Dg Abd Portable 1v  Result Date: 01/11/2017 CLINICAL DATA:  Severe shortness of breath and abdominal distention. EXAM: PORTABLE ABDOMEN - 1 VIEW COMPARISON:  11/15/2015 FINDINGS: 1210 hours. Supine portable view the abdomen shows no gaseous bowel dilatation to suggest obstruction. No unexpected abdominopelvic calcification. Visualized bony anatomy unremarkable. IMPRESSION: Negative. Electronically Signed  By: Kennith CenterEric  Mansell M.D.   On: 01/11/2017 12:21     Scheduled Meds: . fluticasone furoate-vilanterol  1 puff Inhalation Daily  . gabapentin  600 mg Oral TID  . guaiFENesin  1,200 mg Oral BID  . heparin  5,000 Units Subcutaneous Q8H  . insulin aspart  0-5 Units Subcutaneous QHS  . insulin aspart  0-9 Units Subcutaneous TID WC  . insulin aspart  5 Units Subcutaneous Once  . methylPREDNISolone sodium succinate  60 mg Intravenous Q6H  . montelukast  5 mg Oral QHS  . polyethylene glycol  17 g Oral BID  . roflumilast  500 mcg Oral Daily  . senna  2 tablet Oral Daily  . tiotropium  18 mcg Inhalation Daily  . torsemide  20 mg Oral Daily   Continuous Infusions: . sodium chloride 50 mL/hr at 01/12/17 1048  . levofloxacin (LEVAQUIN) IV Stopped (01/11/17 1500)    Rafaelita Foister M. Elvera LennoxGherghe, MD [facility] [phone]  If 7PM-7AM, please contact night-coverage [email or web address] Password [email or web address] 01/12/2017, 10:55 AM

## 2017-01-12 NOTE — ED Notes (Signed)
Pt took BiPap off and placed herself on [place]

## 2017-01-12 NOTE — Progress Notes (Signed)
Inpatient Diabetes Program Recommendations  AACE/ADA: New Consensus Statement on Inpatient Glycemic Control (2015)  Target Ranges:  Prepandial:   less than 140 mg/dL      Peak postprandial:   less than 180 mg/dL (1-2 hours)      Critically ill patients:  140 - 180 mg/dL   Lab Results  Component Value Date   GLUCAP 267 (H) 01/12/2017   HGBA1C 7.1 (H) 08/26/2016    Review of Glycemic Control  Diabetes history: DM2 Outpatient Diabetes medications: None Current orders for Inpatient glycemic control: Novolog 0-9 units tidwc and hs On Solumedrol 60 mg Q6H.  Inpatient Diabetes Program Recommendations:    Increase Novolog to 0-15 units tidwc and hs Add Novolog 3 units tidwc for meal coverage insulin while pt on steroids Change diet to CHO mod med.  Will follow. Thank you. Ailene Ardshonda Anne Boltz, RD, LDN, CDE Inpatient Diabetes Coordinator [phone]

## 2017-01-13 ENCOUNTER — Other Ambulatory Visit: Payer: Self-pay | Admitting: Family

## 2017-01-13 DIAGNOSIS — E1142 Type 2 diabetes mellitus with diabetic polyneuropathy: Secondary | ICD-10-CM

## 2017-01-13 DIAGNOSIS — J42 Unspecified chronic bronchitis: Secondary | ICD-10-CM

## 2017-01-13 LAB — CBC
HEMATOCRIT: 44.9 % (ref 36.0–46.0)
Hemoglobin: 14.3 g/dL (ref 12.0–15.0)
MCH: 32.6 pg (ref 26.0–34.0)
MCHC: 31.8 g/dL (ref 30.0–36.0)
MCV: 102.5 fL — ABNORMAL HIGH (ref 78.0–100.0)
PLATELETS: 153 10*3/uL (ref 150–400)
RBC: 4.38 MIL/uL (ref 3.87–5.11)
RDW: 14.1 % (ref 11.5–15.5)
WBC: 21.2 10*3/uL — ABNORMAL HIGH (ref 4.0–10.5)

## 2017-01-13 LAB — BASIC METABOLIC PANEL
ANION GAP: 12 (ref 5–15)
BUN: 18 mg/dL (ref 6–20)
CO2: 35 mmol/L — AB (ref 22–32)
Calcium: 9.7 mg/dL (ref 8.9–10.3)
Chloride: 85 mmol/L — ABNORMAL LOW (ref 101–111)
Creatinine, Ser: 0.66 mg/dL (ref 0.44–1.00)
GLUCOSE: 288 mg/dL — AB (ref 65–99)
Potassium: 5.7 mmol/L — ABNORMAL HIGH (ref 3.5–5.1)
Sodium: 132 mmol/L — ABNORMAL LOW (ref 135–145)

## 2017-01-13 LAB — GLUCOSE, CAPILLARY
GLUCOSE-CAPILLARY: 326 mg/dL — AB (ref 65–99)
GLUCOSE-CAPILLARY: 358 mg/dL — AB (ref 65–99)
Glucose-Capillary: 452 mg/dL — ABNORMAL HIGH (ref 65–99)
Glucose-Capillary: 349 mg/dL — ABNORMAL HIGH (ref 65–99)

## 2017-01-13 LAB — POTASSIUM: POTASSIUM: 4.4 mmol/L (ref 3.5–5.1)

## 2017-01-13 MED ORDER — PREDNISONE 20 MG PO TABS
40.0000 mg | ORAL_TABLET | Freq: Every day | ORAL | Status: DC
  Administered 2017-01-13 – 2017-01-15 (×3): 40 mg via ORAL
  Filled 2017-01-13 (×3): qty 2

## 2017-01-13 MED ORDER — BUDESONIDE 0.25 MG/2ML IN SUSP
0.2500 mg | Freq: Two times a day (BID) | RESPIRATORY_TRACT | Status: DC
  Administered 2017-01-13 – 2017-01-15 (×4): 0.25 mg via RESPIRATORY_TRACT
  Filled 2017-01-13 (×5): qty 2

## 2017-01-13 MED ORDER — INSULIN ASPART 100 UNIT/ML ~~LOC~~ SOLN: 5.0000 [IU] | Freq: Once | SUBCUTANEOUS | Status: DC

## 2017-01-13 MED ORDER — IPRATROPIUM-ALBUTEROL 0.5-2.5 (3) MG/3ML IN SOLN
3.0000 mL | Freq: Four times a day (QID) | RESPIRATORY_TRACT | Status: DC
  Administered 2017-01-13 – 2017-01-15 (×6): 3 mL via RESPIRATORY_TRACT
  Filled 2017-01-13 (×9): qty 3

## 2017-01-13 MED ORDER — INSULIN GLARGINE 100 UNIT/ML ~~LOC~~ SOLN
10.0000 [IU] | Freq: Every day | SUBCUTANEOUS | Status: DC
  Administered 2017-01-13 – 2017-01-15 (×3): 10 [IU] via SUBCUTANEOUS
  Filled 2017-01-13 (×5): qty 0.1

## 2017-01-13 MED ORDER — INSULIN ASPART 100 UNIT/ML ~~LOC~~ SOLN
14.0000 [IU] | Freq: Once | SUBCUTANEOUS | Status: AC
  Administered 2017-01-13: 14 [IU] via SUBCUTANEOUS

## 2017-01-13 NOTE — Progress Notes (Signed)
PROGRESS NOTE  Mary [name] ZOX:[id number] DOB: 1976/05/01 DOA: 01/11/2017 PCP: Jeanine Luz, FNP   LOS: 1 day   Brief Narrative: This is a 41 y.o female who presented to the ED yesterday for worsening symptoms of SOB and confusion for the past few weeks. PMH significant for advanced COPD with O2 dependency of 3- 3.5L O2 at home, DVT, alcoholism, migraines, pulmonary hypertension, pulmonary embolus and CHF with signs of fatty liver disease.   Assessment & Plan: Active Problems:   COPD (chronic obstructive pulmonary disease) (HCC)   Chronic diastolic heart failure (HCC)   COPD exacerbation (HCC)   Diabetic neuropathy (HCC)   Diabetes mellitus with complication (HCC)   Acute on chronic respiratory failure with hypoxia and hypercapnia (HCC)   Respiratory failure with hypercapnia (HCC)  Acute on chronic respiratory failure with marked hypercapnia and hypoxemia - Likely due to COPD exacerbation - move to telemetry today - she has a chronic degree of CO2 retention - Continue antibiotics, steroids as well as breathing treatments. Change to prednisone today  - respiratory virus panel negative - mobilize more  Leukocytosis - Monitor, likely due to steroids  Chronic diastolic congestive heart failure - Echo on 08/27/16 shows Ef: 65-70% with no wall motion abnormalities.  - Patient appears to be euvolemic  GERD - continue ppi  Type II Diabetes Mellitus - A1C pending - CBGs elevated possibly due to steroids, monitor closely - SSI. Added Lantus today.  Alcohol use - Last drink about 3 days ago, no signs of withdrawals, counseled for cessation  Thrombocytopenia  - Likely due to fatty liver disease/alcohol abuse  DVT prophylaxis: Heparin  Code Status: Full Family Communication: None at bedside. Disposition Plan: SDU. Floor in am if not using BiPAP overnight  Consultants  None  Procedures:   None  Antimicrobials:  Levaquin.  Subjective: - Feeling better,  less dyspnea. No chest pain  Objective: Vitals:   01/13/17 0545 01/13/17 0729 01/13/17 1001 01/13/17 1144  BP:  94/62  114/80  Pulse:  64  74  Resp:  18  17  Temp: 98 F (36.7 C) 97.5 F (36.4 C)  98 F (36.7 C)  TempSrc:  Oral  Oral  SpO2:  96% 92% 92%  Weight: 92.3 kg (203 lb 6.4 oz)     Height:        Intake/Output Summary (Last 24 hours) at 01/13/17 1237 Last data filed at 01/13/17 0729  Gross per 24 hour  Intake          2276.67 ml  Output              725 ml  Net          1551.67 ml   Filed Weights   01/11/17 0359 01/12/17 1631 01/13/17 0545  Weight: 90.7 kg (200 lb) 91.2 kg (201 lb 1 oz) 92.3 kg (203 lb 6.4 oz)    Examination: Constitutional: NAD Vitals:   01/13/17 0545 01/13/17 0729 01/13/17 1001 01/13/17 1144  BP:  94/62  114/80  Pulse:  64  74  Resp:  18  17  Temp: 98 F (36.7 C) 97.5 F (36.4 C)  98 F (36.7 C)  TempSrc:  Oral  Oral  SpO2:  96% 92% 92%  Weight: 92.3 kg (203 lb 6.4 oz)     Height:       Eyes: Lids and conjunctivae normal ENMT: Mucous membranes are moist.  Neck: normal, supple, no masses. Respiratory: minimal wheezing, no crackles  Cardiovascular: Regular rate  and rhythm, no murmurs / rubs / gallops. No LE edema.  Abdomen: no tenderness. Bowel sounds positive.  Musculoskeletal:  No joint deformity upper and lower extremities. No contractures. Normal muscle tone.  Neurologic: Non focal. Psychiatric: Normal judgment and insight. Alert and oriented x 3. Normal mood.    Data Reviewed: I have personally reviewed following labs and imaging studies  CBC:  Recent Labs Lab 01/11/17 0355 01/11/17 1545 01/12/17 0208 01/13/17 0358  WBC 12.0* 11.0* 20.6* 21.2*  HGB 15.4* 14.1 14.0 14.3  HCT 48.3* 44.1 43.5 44.9  MCV 102.8* 101.8* 101.4* 102.5*  PLT 130* 122* 128* 153   Basic Metabolic Panel:  Recent Labs Lab 01/11/17 0355 01/11/17 1545 01/12/17 0208 01/13/17 0358 01/13/17 0545  NA 139  --  134* 132*  --   K 4.0  --  4.3  5.7* 4.4  CL 90*  --  87* 85*  --   CO2 37*  --  35* 35*  --   GLUCOSE 126*  --  238* 288*  --   BUN 8  --  11 18  --   CREATININE 0.47 0.55 0.48 0.66  --   CALCIUM 9.1  --  9.4 9.7  --    BNP (last 3 results)  Recent Labs  04/20/16 1703  PROBNP 30.0   HbA1C: No results for input(s): HGBA1C in the last 72 hours. CBG:  Recent Labs Lab 01/12/17 1515 01/12/17 1636 01/12/17 2121 01/13/17 0728 01/13/17 1138  GLUCAP 236* 260* 312* 326* 452*   Lipid Profile: No results for input(s): CHOL, HDL, LDLCALC, TRIG, CHOLHDL, LDLDIRECT in the last 72 hours. Thyroid Function Tests: No results for input(s): TSH, T4TOTAL, FREET4, T3FREE, THYROIDAB in the last 72 hours. Anemia Panel: No results for input(s): VITAMINB12, FOLATE, FERRITIN, TIBC, IRON, RETICCTPCT in the last 72 hours. Urine analysis:    Component Value Date/Time   COLORURINE YELLOW 07/25/2015 1400   APPEARANCEUR HAZY (A) 07/25/2015 1400   LABSPEC 1.020 07/25/2015 1400   PHURINE 7.0 07/25/2015 1400   GLUCOSEU NEGATIVE 07/25/2015 1400   HGBUR LARGE (A) 07/25/2015 1400   BILIRUBINUR NEGATIVE 07/25/2015 1400   KETONESUR NEGATIVE 07/25/2015 1400   PROTEINUR NEGATIVE 07/25/2015 1400   UROBILINOGEN 1.0 07/25/2015 1400   NITRITE NEGATIVE 07/25/2015 1400   LEUKOCYTESUR NEGATIVE 07/25/2015 1400   Sepsis Labs: Invalid input(s): PROCALCITONIN, LACTICIDVEN  Recent Results (from the past 240 hour(s))  Respiratory Panel by PCR     Status: None   Collection Time: 01/12/17 10:50 AM  Result Value Ref Range Status   Adenovirus NOT DETECTED NOT DETECTED Final   Coronavirus 229E NOT DETECTED NOT DETECTED Final   Coronavirus HKU1 NOT DETECTED NOT DETECTED Final   Coronavirus NL63 NOT DETECTED NOT DETECTED Final   Coronavirus OC43 NOT DETECTED NOT DETECTED Final   Metapneumovirus NOT DETECTED NOT DETECTED Final   Rhinovirus / Enterovirus NOT DETECTED NOT DETECTED Final   Influenza A NOT DETECTED NOT DETECTED Final   Influenza B  NOT DETECTED NOT DETECTED Final   Parainfluenza Virus 1 NOT DETECTED NOT DETECTED Final   Parainfluenza Virus 2 NOT DETECTED NOT DETECTED Final   Parainfluenza Virus 3 NOT DETECTED NOT DETECTED Final   Parainfluenza Virus 4 NOT DETECTED NOT DETECTED Final   Respiratory Syncytial Virus NOT DETECTED NOT DETECTED Final   Bordetella pertussis NOT DETECTED NOT DETECTED Final   Chlamydophila pneumoniae NOT DETECTED NOT DETECTED Final   Mycoplasma pneumoniae NOT DETECTED NOT DETECTED Final  MRSA PCR Screening  Status: None   Collection Time: 01/12/17  4:25 PM  Result Value Ref Range Status   MRSA by PCR NEGATIVE NEGATIVE Final    Comment:        The GeneXpert MRSA Assay (FDA approved for NASAL specimens only), is one component of a comprehensive MRSA colonization surveillance program. It is not intended to diagnose MRSA infection nor to guide or monitor treatment for MRSA infections.       Radiology Studies: No results found.   Scheduled Meds: . budesonide (PULMICORT) nebulizer solution  0.25 mg Nebulization BID  . fluticasone furoate-vilanterol  1 puff Inhalation Daily  . gabapentin  600 mg Oral TID  . guaiFENesin  1,200 mg Oral BID  . heparin  5,000 Units Subcutaneous Q8H  . insulin aspart  0-5 Units Subcutaneous QHS  . insulin aspart  0-9 Units Subcutaneous TID WC  . insulin aspart  14 Units Subcutaneous Once  . insulin glargine  10 Units Subcutaneous Daily  . ipratropium-albuterol  3 mL Nebulization Q6H  . levofloxacin (LEVAQUIN) IV  750 mg Intravenous Q24H  . montelukast  5 mg Oral QHS  . polyethylene glycol  17 g Oral BID  . predniSONE  40 mg Oral Q breakfast  . roflumilast  500 mcg Oral Daily  . senna  2 tablet Oral Daily  . torsemide  20 mg Oral Daily   Continuous Infusions: . sodium chloride 50 mL/hr at 01/13/17 0035    Mirra Basilio M. Elvera Lennox, MD [facility] [phone]  If 7PM-7AM, please contact night-coverage [email or web address] Password  [facility] 01/13/2017, 12:37 PM

## 2017-01-14 LAB — CBC
HCT: 45.7 % (ref 36.0–46.0)
HEMOGLOBIN: 14.7 g/dL (ref 12.0–15.0)
MCH: 32.5 pg (ref 26.0–34.0)
MCHC: 32.2 g/dL (ref 30.0–36.0)
MCV: 101.1 fL — ABNORMAL HIGH (ref 78.0–100.0)
PLATELETS: 132 10*3/uL — AB (ref 150–400)
RBC: 4.52 MIL/uL (ref 3.87–5.11)
RDW: 14.1 % (ref 11.5–15.5)
WBC: 14.9 10*3/uL — ABNORMAL HIGH (ref 4.0–10.5)

## 2017-01-14 LAB — BASIC METABOLIC PANEL
Anion gap: 8 (ref 5–15)
BUN: 16 mg/dL (ref 6–20)
CALCIUM: 9.3 mg/dL (ref 8.9–10.3)
CO2: 39 mmol/L — AB (ref 22–32)
CREATININE: 0.73 mg/dL (ref 0.44–1.00)
Chloride: 86 mmol/L — ABNORMAL LOW (ref 101–111)
GFR calc Af Amer: >60 mL/min (ref >60)
GFR calc non Af Amer: >60 mL/min (ref >60)
GLUCOSE: 277 mg/dL — AB (ref 65–99)
Potassium: 3.9 mmol/L (ref 3.5–5.1)
Sodium: 133 mmol/L — ABNORMAL LOW (ref 135–145)

## 2017-01-14 LAB — GLUCOSE, CAPILLARY
GLUCOSE-CAPILLARY: 242 mg/dL — AB (ref 65–99)
GLUCOSE-CAPILLARY: 408 mg/dL — AB (ref 65–99)
Glucose-Capillary: 286 mg/dL — ABNORMAL HIGH (ref 65–99)
Glucose-Capillary: 258 mg/dL — ABNORMAL HIGH (ref 65–99)

## 2017-01-14 LAB — HEMOGLOBIN A1C
Hgb A1c MFr Bld: 5.7 % — ABNORMAL HIGH (ref 4.8–5.6)
MEAN PLASMA GLUCOSE: 117 mg/dL

## 2017-01-14 MED ORDER — INSULIN ASPART 100 UNIT/ML ~~LOC~~ SOLN
14.0000 [IU] | Freq: Once | SUBCUTANEOUS | Status: AC
  Administered 2017-01-14: 14 [IU] via SUBCUTANEOUS

## 2017-01-14 NOTE — Progress Notes (Signed)
NT Beatrix ShipperKatie Lambert ambulated patient.  O2 sats dropped to 85% on 3L.  Patient stopped to rest sats came up to 89% on 3 L for remainder of walk.  Dr Elvera LennoxGherghe notified.  He stated discharge might be for tomorrow.

## 2017-01-14 NOTE — Progress Notes (Signed)
PROGRESS NOTE  NALEYAH OHLINGER ZOX:[id number] DOB: [birth year] DOA: 01/11/2017 PCP: Jeanine Luz, FNP   LOS: 2 days   Brief Narrative: This is a 41 y.o female who presented to the ED yesterday for worsening symptoms of SOB and confusion for the past few weeks. PMH significant for advanced COPD with O2 dependency of 3- 3.5L O2 at home, DVT, alcoholism, migraines, pulmonary hypertension, pulmonary embolus and CHF with signs of fatty liver disease.   Assessment & Plan: Active Problems:   COPD (chronic obstructive pulmonary disease) (HCC)   Chronic diastolic heart failure (HCC)   COPD exacerbation (HCC)   Diabetic neuropathy (HCC)   Diabetes mellitus with complication (HCC)   Acute on chronic respiratory failure with hypoxia and hypercapnia (HCC)   Respiratory failure with hypercapnia (HCC)  Acute on chronic respiratory failure with marked hypercapnia and hypoxemia - Likely due to COPD exacerbation - move to telemetry today - she has a chronic degree of CO2 retention - Continue antibiotics, steroids as well as breathing treatments. - respiratory virus panel negative - significantly dyspneic today with ambulation, sats dropping. Not ready for d/c  Leukocytosis - due to steroids  Chronic diastolic congestive heart failure - Echo on 08/27/16 shows Ef: 65-70% with no wall motion abnormalities.  - Patient appears to be euvolemic  GERD - continue ppi  Type II Diabetes Mellitus - A1C 5.7. Excellent control - CBGs elevated possibly due to steroids, monitor closely - SSI.   Alcohol use - Last drink about 3 days ago, no signs of withdrawals, counseled for cessation  Thrombocytopenia  - Likely due to fatty liver disease/alcohol abuse  DVT prophylaxis: Heparin  Code Status: Full Family Communication: None at bedside. Disposition Plan: home tomorrow if stable  Consultants  None  Procedures:   None  Antimicrobials:  Levaquin.  Subjective: - Feeling better, less  dyspnea at rest but symptomatic with ambulation.   Objective: Vitals:   01/13/17 1709 01/13/17 2042 01/14/17 0405 01/14/17 0700  BP: 113/76 129/81 110/70   Pulse: 62 70 74   Resp: 18 16 16    Temp: 98 F (36.7 C) 97.6 F (36.4 C) 97.7 F (36.5 C)   TempSrc: Oral Axillary Axillary   SpO2: 95% 93% 95%   Weight:    91.8 kg (202 lb 6.4 oz)  Height:        Intake/Output Summary (Last 24 hours) at 01/14/17 1330 Last data filed at 01/13/17 1351  Gross per 24 hour  Intake                0 ml  Output             1300 ml  Net            -1300 ml   Filed Weights   01/12/17 1631 01/13/17 0545 01/14/17 0700  Weight: 91.2 kg (201 lb 1 oz) 92.3 kg (203 lb 6.4 oz) 91.8 kg (202 lb 6.4 oz)    Examination: Constitutional: NAD Vitals:   01/13/17 1709 01/13/17 2042 01/14/17 0405 01/14/17 0700  BP: 113/76 129/81 110/70   Pulse: 62 70 74   Resp: 18 16 16    Temp: 98 F (36.7 C) 97.6 F (36.4 C) 97.7 F (36.5 C)   TempSrc: Oral Axillary Axillary   SpO2: 95% 93% 95%   Weight:    91.8 kg (202 lb 6.4 oz)  Height:        Eyes: Lids and conjunctivae normal ENMT: Mucous membranes are moist.  Neck: normal, supple,  no masses. Respiratory: minimal wheezing, no crackles  Cardiovascular: Regular rate and rhythm, no murmurs / rubs / gallops. No LE edema.  Abdomen: no tenderness. Bowel sounds positive.  Musculoskeletal:  No joint deformity upper and lower extremities. No contractures. Normal muscle tone.  Neurologic: Non focal. Psychiatric: Normal judgment and insight. Alert and oriented x 3. Normal mood.    Data Reviewed: I have personally reviewed following labs and imaging studies  CBC:  Recent Labs Lab 01/11/17 0355 01/11/17 1545 01/12/17 0208 01/13/17 0358 01/14/17 0348  WBC 12.0* 11.0* 20.6* 21.2* 14.9*  HGB 15.4* 14.1 14.0 14.3 14.7  HCT 48.3* 44.1 43.5 44.9 45.7  MCV 102.8* 101.8* 101.4* 102.5* 101.1*  PLT 130* 122* 128* 153 132*   Basic Metabolic Panel:  Recent  Labs Lab 01/11/17 0355 01/11/17 1545 01/12/17 0208 01/13/17 0358 01/13/17 0545 01/14/17 0348  NA 139  --  134* 132*  --  133*  K 4.0  --  4.3 5.7* 4.4 3.9  CL 90*  --  87* 85*  --  86*  CO2 37*  --  35* 35*  --  39*  GLUCOSE 126*  --  238* 288*  --  277*  BUN 8  --  11 18  --  16  CREATININE 0.47 0.55 0.48 0.66  --  0.73  CALCIUM 9.1  --  9.4 9.7  --  9.3   BNP (last 3 results)  Recent Labs  04/20/16 1703  PROBNP 30.0   HbA1C:  Recent Labs  01/13/17 0358  HGBA1C 5.7*   CBG:  Recent Labs Lab 01/13/17 1138 01/13/17 1708 01/13/17 2048 01/14/17 0718 01/14/17 1110  GLUCAP 452* 349* 358* 242* 286*   Lipid Profile: No results for input(s): CHOL, HDL, LDLCALC, TRIG, CHOLHDL, LDLDIRECT in the last 72 hours. Thyroid Function Tests: No results for input(s): TSH, T4TOTAL, FREET4, T3FREE, THYROIDAB in the last 72 hours. Anemia Panel: No results for input(s): VITAMINB12, FOLATE, FERRITIN, TIBC, IRON, RETICCTPCT in the last 72 hours. Urine analysis:    Component Value Date/Time   COLORURINE YELLOW 07/25/2015 1400   APPEARANCEUR HAZY (A) 07/25/2015 1400   LABSPEC 1.020 07/25/2015 1400   PHURINE 7.0 07/25/2015 1400   GLUCOSEU NEGATIVE 07/25/2015 1400   HGBUR LARGE (A) 07/25/2015 1400   BILIRUBINUR NEGATIVE 07/25/2015 1400   KETONESUR NEGATIVE 07/25/2015 1400   PROTEINUR NEGATIVE 07/25/2015 1400   UROBILINOGEN 1.0 07/25/2015 1400   NITRITE NEGATIVE 07/25/2015 1400   LEUKOCYTESUR NEGATIVE 07/25/2015 1400   Sepsis Labs: Invalid input(s): PROCALCITONIN, LACTICIDVEN  Recent Results (from the past 240 hour(s))  Respiratory Panel by PCR     Status: None   Collection Time: 01/12/17 10:50 AM  Result Value Ref Range Status   Adenovirus NOT DETECTED NOT DETECTED Final   Coronavirus 229E NOT DETECTED NOT DETECTED Final   Coronavirus HKU1 NOT DETECTED NOT DETECTED Final   Coronavirus NL63 NOT DETECTED NOT DETECTED Final   Coronavirus OC43 NOT DETECTED NOT DETECTED Final    Metapneumovirus NOT DETECTED NOT DETECTED Final   Rhinovirus / Enterovirus NOT DETECTED NOT DETECTED Final   Influenza A NOT DETECTED NOT DETECTED Final   Influenza B NOT DETECTED NOT DETECTED Final   Parainfluenza Virus 1 NOT DETECTED NOT DETECTED Final   Parainfluenza Virus 2 NOT DETECTED NOT DETECTED Final   Parainfluenza Virus 3 NOT DETECTED NOT DETECTED Final   Parainfluenza Virus 4 NOT DETECTED NOT DETECTED Final   Respiratory Syncytial Virus NOT DETECTED NOT DETECTED Final   Bordetella pertussis  NOT DETECTED NOT DETECTED Final   Chlamydophila pneumoniae NOT DETECTED NOT DETECTED Final   Mycoplasma pneumoniae NOT DETECTED NOT DETECTED Final  MRSA PCR Screening     Status: None   Collection Time: 01/12/17  4:25 PM  Result Value Ref Range Status   MRSA by PCR NEGATIVE NEGATIVE Final    Comment:        The GeneXpert MRSA Assay (FDA approved for NASAL specimens only), is one component of a comprehensive MRSA colonization surveillance program. It is not intended to diagnose MRSA infection nor to guide or monitor treatment for MRSA infections.      Radiology Studies: No results found.  Scheduled Meds: . budesonide (PULMICORT) nebulizer solution  0.25 mg Nebulization BID  . fluticasone furoate-vilanterol  1 puff Inhalation Daily  . gabapentin  600 mg Oral TID  . guaiFENesin  1,200 mg Oral BID  . heparin  5,000 Units Subcutaneous Q8H  . insulin aspart  0-5 Units Subcutaneous QHS  . insulin aspart  0-9 Units Subcutaneous TID WC  . insulin glargine  10 Units Subcutaneous Daily  . ipratropium-albuterol  3 mL Nebulization Q6H  . levofloxacin (LEVAQUIN) IV  750 mg Intravenous Q24H  . montelukast  5 mg Oral QHS  . polyethylene glycol  17 g Oral BID  . predniSONE  40 mg Oral Q breakfast  . roflumilast  500 mcg Oral Daily  . senna  2 tablet Oral Daily  . torsemide  20 mg Oral Daily   Continuous Infusions: . sodium chloride 50 mL/hr (01/13/17 1954)   Dervin Vore M. Elvera Lennox,  MD [facility] [phone]  If 7PM-7AM, please contact night-coverage [email or web address] Password [email or web address] 01/14/2017, 1:30 PM

## 2017-01-15 DIAGNOSIS — J441 Chronic obstructive pulmonary disease with (acute) exacerbation: Principal | ICD-10-CM

## 2017-01-15 LAB — GLUCOSE, CAPILLARY
Glucose-Capillary: 156 mg/dL — ABNORMAL HIGH (ref 65–99)
Glucose-Capillary: 268 mg/dL — ABNORMAL HIGH (ref 65–99)

## 2017-01-15 LAB — PROCALCITONIN: PROCALCITONIN: 0.18 ng/mL

## 2017-01-15 MED ORDER — PREDNISONE 10 MG (21) PO TBPK: 10.0000 mg | ORAL_TABLET | Freq: Every day | ORAL | 0 refills | Status: DC

## 2017-01-15 MED ORDER — LEVOFLOXACIN 750 MG PO TABS: 750.0000 mg | ORAL_TABLET | Freq: Every day | ORAL | 0 refills | Status: DC

## 2017-01-15 MED ORDER — LEVOFLOXACIN 750 MG PO TABS: 750.0000 mg | ORAL_TABLET | Freq: Every day | ORAL | Status: DC

## 2017-01-15 NOTE — Care Management Note (Signed)
Case Management Note  Patient Details  Name: Jana HakimMichelle L Maciver MRN: [id number] Date of Birth: 09/18/1976  Subjective/Objective:   41 y.o. F admitted 01/11/2017 with COPD [facility]  Tells me she has all DME and no HH needs. Lives with spouse who will bring O2 tank for the ride home.                   Action/Plan:CM will sign off for now but will be available should additional discharge needs arise or disposition change.    Expected Discharge Date:  01/15/17               Expected Discharge Plan:  Home/Self Care  In-House Referral:  NA  Discharge planning Services  CM Consult  Post Acute Care Choice:  Resumption of Svcs/PTA Provider Choice offered to:  Patient  DME Arranged:  Oxygen DME Agency:  [facility]  HH Arranged:  NA HH Agency:  NA  Status of Service:  Completed, signed off  If discussed at Long Length of Stay Meetings, dates discussed:    Additional Comments:  Yvone NeuCrutchfield, Siddh Vandeventer M, RN 01/15/2017, 10:12 AM

## 2017-01-15 NOTE — Progress Notes (Signed)
Discharge instructions, RX's and follow up appts explained and provided to patient verbalized understanding. Patient left floor via wheelchair accompanied by staff no c/o pain at d/c.  Lorenda Grecco Lynn, RN  

## 2017-01-15 NOTE — Discharge Instructions (Signed)
Follow with Mary [name], Gregory, FNP in 2-3 weeks  Please get a complete blood count and chemistry panel checked by your Primary MD at your next visit, and again as instructed by your Primary MD. Please get your medications reviewed and adjusted by your Primary MD.  Please request your Primary MD to go over all Hospital Tests and Procedure/Radiological results at the follow up, please get all Hospital records sent to your Prim MD by signing hospital release before you go home.  If you had Pneumonia of Lung problems at the Hospital: Please get a 2 view Chest X ray done in 6-8 weeks after hospital discharge or sooner if instructed by your Primary MD.  If you have Congestive Heart Failure: Please call your Cardiologist or Primary MD anytime you have any of the following symptoms:  1) 3 pound weight gain in 24 hours or 5 pounds in 1 week  2) shortness of breath, with or without a dry hacking cough  3) swelling in the hands, feet or stomach  4) if you have to sleep on extra pillows at night in order to breathe  Follow cardiac low salt diet and 1.5 lit/day fluid restriction.  If you have diabetes Accuchecks 4 times/day, Once in AM empty stomach and then before each meal. Log in all results and show them to your primary doctor at your next visit. If any glucose reading is under 80 or above 300 call your primary MD immediately.  If you have Seizure/Convulsions/Epilepsy: Please do not drive, operate heavy machinery, participate in activities at heights or participate in high speed sports until you have seen by Primary MD or a Neurologist and advised to do so again.  If you had Gastrointestinal Bleeding: Please ask your Primary MD to check a complete blood count within one week of discharge or at your next visit. Your endoscopic/colonoscopic biopsies that are pending at the time of discharge, will also need to followed by your Primary MD.  Get Medicines reviewed and adjusted. Please take all your  medications with you for your next visit with your Primary MD  Please request your Primary MD to go over all hospital tests and procedure/radiological results at the follow up, please ask your Primary MD to get all Hospital records sent to his/her office.  If you experience worsening of your admission symptoms, develop shortness of breath, life threatening emergency, suicidal or homicidal thoughts you must seek medical attention immediately by calling 911 or calling your MD immediately  if symptoms less severe.  You must read complete instructions/literature along with all the possible adverse reactions/side effects for all the Medicines you take and that have been prescribed to you. Take any new Medicines after you have completely understood and accpet all the possible adverse reactions/side effects.   Do not drive or operate heavy machinery when taking Pain medications.   Do not take more than prescribed Pain, Sleep and Anxiety Medications  Special Instructions: If you have smoked or chewed Tobacco  in the last 2 yrs please stop smoking, stop any regular Alcohol  and or any Recreational drug use.  Wear Seat belts while driving.  Please note You were cared for by a hospitalist during your hospital stay. If you have any questions about your discharge medications or the care you received while you were in the hospital after you are discharged, you can call the unit and asked to speak with the hospitalist on call if the hospitalist that took care of you is not available. Once  you are discharged, your primary care physician will handle any further medical issues. Please note that NO REFILLS for any discharge medications will be authorized once you are discharged, as it is imperative that you return to your primary care physician (or establish a relationship with a primary care physician if you do not have one) for your aftercare needs so that they can reassess your need for medications and monitor your  lab values.  You can reach the hospitalist office at phone [phone] or fax [phone]   If you do not have a primary care physician, you can call [phone] for a physician referral.  Activity: As tolerated with Full fall precautions use walker/cane & assistance as needed  Diet: regular  Disposition Home

## 2017-01-15 NOTE — Discharge Summary (Signed)
Physician Discharge Summary  Mary [name] ZOX:[id number] DOB: 02-Jan-1976 DOA: 01/11/2017  PCP: Jeanine Luz, FNP  Admit date: 01/11/2017 Discharge date: 01/15/2017  Admitted From: home Disposition:  home  Recommendations for Outpatient Follow-up:  1. Follow up with PCP in 1-2 weeks 2. Continue Levaquin for 3 more days  3. Continue prednisone taper  Home Health: none Equipment/Devices: home oxygen  Discharge Condition: stable CODE STATUS: Full code Diet recommendation: regular  HPI: per Dr. Konrad Dolores,  Mary [name] is a 41 y.o. female with medical history significant of advanced COPD with oxygen dependency of 3-1/2 L at baseline, DVT, alcoholism, migraines, pulmonary hypertension, pulmonary embolus, CHF, fatty liver disease. Shortness breath with wheezing and intermittent productive cough. Started approximately 1 week ago. Initially nebulizer treatments with improvement but most recently no relief with these treatments. Patient was on a steroid taper for headaches recently which did help with symptoms however she is currently on her 5 mg daily dose. In eyes any associated fevers, chest pain, palpitations, dysuria, frequency, abdominal pain, diarrhea, vomiting, neck stiffness, lower back pain. Patient does endorse some sensation of fullness in her abdomen which is nonspecific and fairly constant. Patient endorses infrequent bowel movements and needing MiraLAX for stooling.  Hospital Course: Discharge Diagnoses:  Active Problems:   COPD (chronic obstructive pulmonary disease) (HCC)   Chronic diastolic heart failure (HCC)   COPD exacerbation (HCC)   Diabetic neuropathy (HCC)   Diabetes mellitus with complication (HCC)   Acute on chronic respiratory failure with hypoxia and hypercapnia (HCC)   Respiratory failure with hypercapnia (HCC)   Acute on chronic respiratory failure with marked hypercapnia and hypoxemia - Likely due to COPD exacerbation, she was placed on antibiotics,  supplemental oxygen, steroids and scheduled nebulizers. She gradually improved and returned to her baseline. She has advanced COPD and her baseline is 3.5 L. Respiratory virus panel negative. She was discharged home in stable condition, at baseline, and she is to complete 3 additional days of antibiotics as well as a slow prednisone taper. Leukocytosis - due to steroids Chronic diastolic congestive heart failure - Echo on 08/27/16 shows Ef: 65-70% with no wall motion abnormalities. Patient appears to be euvolemic, resume home medications GERD - continue ppi Type II Diabetes Mellitus - A1C 5.7. Excellent control, CBGs elevated due to steroids, will improve as the steroids are tapered down Alcohol use - Last drink about 3 days prior to this admission, no signs of withdrawals, counseled for cessation. She has fatty liver disease on most recent imaging, she will likely develop cirrhosis if she continues to drink Thrombocytopenia - Likely due to fatty liver disease/alcohol abuse   Discharge Instructions  Allergies as of 01/15/2017   No Known Allergies     Medication List    STOP taking these medications   predniSONE 5 MG tablet Commonly known as:  DELTASONE Replaced by:  predniSONE 10 MG (21) Tbpk tablet     TAKE these medications   albuterol 108 (90 Base) MCG/ACT inhaler Commonly known as:  PROVENTIL HFA;VENTOLIN HFA Inhale 2 puffs into the lungs every 6 (six) hours as needed for wheezing or shortness of breath.   BREO ELLIPTA 100-25 MCG/INH Aepb Generic drug:  fluticasone furoate-vilanterol INHALE 1 PUFF INTO THE LUNGS DAILY.   gabapentin 300 MG capsule Commonly known as:  NEURONTIN TAKE 2 CAPSULES BY MOUTH 3 TIMES DAILY.   levofloxacin 750 MG tablet Commonly known as:  LEVAQUIN Take 1 tablet (750 mg total) by mouth daily.   montelukast 5  MG chewable tablet Commonly known as:  SINGULAIR Chew 1 tablet (5 mg total) by mouth at bedtime.   pantoprazole 40 MG tablet Commonly known  as:  PROTONIX Take 1 tablet (40 mg total) by mouth 2 (two) times daily before a meal. What changed:  when to take this   potassium chloride SA 20 MEQ tablet Commonly known as:  K-DUR,KLOR-CON Take 1 tablet (20 mEq total) by mouth daily. What changed:  when to take this  reasons to take this   predniSONE 10 MG (21) Tbpk tablet Commonly known as:  STERAPRED UNI-PAK 21 TAB Take 1 tablet (10 mg total) by mouth daily. 4 tablets daily x 4 days then 3 daily x 4 days then 2 daily x 4 days then 1 daily x 4 days then return to chronic dose Replaces:  predniSONE 5 MG tablet   roflumilast 500 MCG Tabs tablet Commonly known as:  DALIRESP Take 500 mcg by mouth daily.   SPIRIVA RESPIMAT 1.25 MCG/ACT Aers Generic drug:  Tiotropium Bromide Monohydrate INHALE 2 PUFFS INTO THE LUNGS 2 (TWO) TIMES DAILY. What changed:  when to take this   torsemide 20 MG tablet Commonly known as:  DEMADEX Take 1 tablet (20 mg total) by mouth daily.   traMADol 50 MG tablet Commonly known as:  ULTRAM TAKE 1 TABLET BY MOUTH EVERY 6 HOURS AS NEEDED FOR SEVERE PAIN What changed:  See the new instructions.      Follow-up Information    Jeanine Luzalone, Gregory, FNP. Schedule an appointment as soon as possible for a visit in 2 week(s).   Specialty:  Family Medicine Contact information: [address] [place] [address] [phone]          No Known Allergies  Consultations:  None   Procedures/Studies:  Dg Chest Port 1 View  Result Date: 01/11/2017 CLINICAL DATA:  Shortness of Breath EXAM: PORTABLE CHEST 1 VIEW COMPARISON:  01/11/2017 FINDINGS: Hyperinflation of the lungs. There is mild cardiomegaly. No confluent opacities, effusions or edema. No acute bony abnormality. IMPRESSION: Hyperinflation, mild cardiomegaly. Electronically Signed   By: Charlett NoseKevin  Dover M.D.   On: 01/11/2017 12:21   Dg Chest Port 1 View  Result Date: 01/11/2017 CLINICAL DATA:  Shortness of breath and wheezing tonight. History of COPD.  EXAM: PORTABLE CHEST 1 VIEW COMPARISON:  08/25/2016 FINDINGS: Emphysematous changes in the lungs. Normal heart size and pulmonary vascularity. No focal airspace disease or consolidation in the lungs. No blunting of costophrenic angles. No pneumothorax. Mediastinal contours appear intact. IMPRESSION: No active disease. Electronically Signed   By: Burman NievesWilliam  Stevens M.D.   On: 01/11/2017 04:45   Dg Abd Portable 1v  Result Date: 01/11/2017 CLINICAL DATA:  Severe shortness of breath and abdominal distention. EXAM: PORTABLE ABDOMEN - 1 VIEW COMPARISON:  11/15/2015 FINDINGS: 1210 hours. Supine portable view the abdomen shows no gaseous bowel dilatation to suggest obstruction. No unexpected abdominopelvic calcification. Visualized bony anatomy unremarkable. IMPRESSION: Negative. Electronically Signed   By: Kennith CenterEric  Mansell M.D.   On: 01/11/2017 12:21     Subjective: - no chest pain, shortness of breath, no abdominal pain, nausea or vomiting.   Discharge Exam: Vitals:   01/15/17 1000 01/15/17 1352  BP:  (!) 103/58  Pulse: 82 75  Resp:  18  Temp:  98.2 F (36.8 C)   Vitals:   01/15/17 0853 01/15/17 0900 01/15/17 1000 01/15/17 1352  BP:    (!) 103/58  Pulse:  98 82 75  Resp:    18  Temp:    98.2 F (36.8 C)  TempSrc:    Oral  SpO2: 92% 92% 98% 98%  Weight:      Height:        General: Pt is alert, awake, not in acute distress Cardiovascular: RRR, S1/S2 +, no rubs, no gallops Respiratory: CTA bilaterally, no wheezing, no rhonchi, decreased breath sounds throughout Abdominal: Soft, NT, ND, bowel sounds + Extremities: no edema, no cyanosis   The results of significant diagnostics from this hospitalization (including imaging, microbiology, ancillary and laboratory) are listed below for reference.     Microbiology: Recent Results (from the past 240 hour(s))  Respiratory Panel by PCR     Status: None   Collection Time: 01/12/17 10:50 AM  Result Value Ref Range Status   Adenovirus NOT  DETECTED NOT DETECTED Final   Coronavirus 229E NOT DETECTED NOT DETECTED Final   Coronavirus HKU1 NOT DETECTED NOT DETECTED Final   Coronavirus NL63 NOT DETECTED NOT DETECTED Final   Coronavirus OC43 NOT DETECTED NOT DETECTED Final   Metapneumovirus NOT DETECTED NOT DETECTED Final   Rhinovirus / Enterovirus NOT DETECTED NOT DETECTED Final   Influenza A NOT DETECTED NOT DETECTED Final   Influenza B NOT DETECTED NOT DETECTED Final   Parainfluenza Virus 1 NOT DETECTED NOT DETECTED Final   Parainfluenza Virus 2 NOT DETECTED NOT DETECTED Final   Parainfluenza Virus 3 NOT DETECTED NOT DETECTED Final   Parainfluenza Virus 4 NOT DETECTED NOT DETECTED Final   Respiratory Syncytial Virus NOT DETECTED NOT DETECTED Final   Bordetella pertussis NOT DETECTED NOT DETECTED Final   Chlamydophila pneumoniae NOT DETECTED NOT DETECTED Final   Mycoplasma pneumoniae NOT DETECTED NOT DETECTED Final  MRSA PCR Screening     Status: None   Collection Time: 01/12/17  4:25 PM  Result Value Ref Range Status   MRSA by PCR NEGATIVE NEGATIVE Final    Comment:        The GeneXpert MRSA Assay (FDA approved for NASAL specimens only), is one component of a comprehensive MRSA colonization surveillance program. It is not intended to diagnose MRSA infection nor to guide or monitor treatment for MRSA infections.      Labs: BNP (last 3 results)  Recent Labs  04/01/16 1750 07/18/16 0400 08/25/16 0713  BNP 19.4 100.3* 23.4   Basic Metabolic Panel:  Recent Labs Lab 01/11/17 0355 01/11/17 1545 01/12/17 0208 01/13/17 0358 01/13/17 0545 01/14/17 0348  NA 139  --  134* 132*  --  133*  K 4.0  --  4.3 5.7* 4.4 3.9  CL 90*  --  87* 85*  --  86*  CO2 37*  --  35* 35*  --  39*  GLUCOSE 126*  --  238* 288*  --  277*  BUN 8  --  11 18  --  16  CREATININE 0.47 0.55 0.48 0.66  --  0.73  CALCIUM 9.1  --  9.4 9.7  --  9.3   Liver Function Tests: No results for input(s): AST, ALT, ALKPHOS, BILITOT, PROT, ALBUMIN  in the last 168 hours. No results for input(s): LIPASE, AMYLASE in the last 168 hours. No results for input(s): AMMONIA in the last 168 hours. CBC:  Recent Labs Lab 01/11/17 0355 01/11/17 1545 01/12/17 0208 01/13/17 0358 01/14/17 0348  WBC 12.0* 11.0* 20.6* 21.2* 14.9*  HGB 15.4* 14.1 14.0 14.3 14.7  HCT 48.3* 44.1 43.5 44.9 45.7  MCV 102.8* 101.8* 101.4* 102.5* 101.1*  PLT 130* 122* 128* 153 132*  Cardiac Enzymes: No results for input(s): CKTOTAL, CKMB, CKMBINDEX, TROPONINI in the last 168 hours. BNP: Invalid input(s): POCBNP CBG:  Recent Labs Lab 01/14/17 1110 01/14/17 1611 01/14/17 2128 01/15/17 0735 01/15/17 1150  GLUCAP 286* 408* 258* 156* 268*   D-Dimer No results for input(s): DDIMER in the last 72 hours. Hgb A1c  Recent Labs  01/13/17 0358  HGBA1C 5.7*   Lipid Profile No results for input(s): CHOL, HDL, LDLCALC, TRIG, CHOLHDL, LDLDIRECT in the last 72 hours. Thyroid function studies No results for input(s): TSH, T4TOTAL, T3FREE, THYROIDAB in the last 72 hours.  Invalid input(s): FREET3 Anemia work up No results for input(s): VITAMINB12, FOLATE, FERRITIN, TIBC, IRON, RETICCTPCT in the last 72 hours. Urinalysis    Component Value Date/Time   COLORURINE YELLOW 07/25/2015 1400   APPEARANCEUR HAZY (A) 07/25/2015 1400   LABSPEC 1.020 07/25/2015 1400   PHURINE 7.0 07/25/2015 1400   GLUCOSEU NEGATIVE 07/25/2015 1400   HGBUR LARGE (A) 07/25/2015 1400   BILIRUBINUR NEGATIVE 07/25/2015 1400   KETONESUR NEGATIVE 07/25/2015 1400   PROTEINUR NEGATIVE 07/25/2015 1400   UROBILINOGEN 1.0 07/25/2015 1400   NITRITE NEGATIVE 07/25/2015 1400   LEUKOCYTESUR NEGATIVE 07/25/2015 1400   Sepsis Labs Invalid input(s): PROCALCITONIN,  WBC,  LACTICIDVEN Microbiology Recent Results (from the past 240 hour(s))  Respiratory Panel by PCR     Status: None   Collection Time: 01/12/17 10:50 AM  Result Value Ref Range Status   Adenovirus NOT DETECTED NOT DETECTED Final    Coronavirus 229E NOT DETECTED NOT DETECTED Final   Coronavirus HKU1 NOT DETECTED NOT DETECTED Final   Coronavirus NL63 NOT DETECTED NOT DETECTED Final   Coronavirus OC43 NOT DETECTED NOT DETECTED Final   Metapneumovirus NOT DETECTED NOT DETECTED Final   Rhinovirus / Enterovirus NOT DETECTED NOT DETECTED Final   Influenza A NOT DETECTED NOT DETECTED Final   Influenza B NOT DETECTED NOT DETECTED Final   Parainfluenza Virus 1 NOT DETECTED NOT DETECTED Final   Parainfluenza Virus 2 NOT DETECTED NOT DETECTED Final   Parainfluenza Virus 3 NOT DETECTED NOT DETECTED Final   Parainfluenza Virus 4 NOT DETECTED NOT DETECTED Final   Respiratory Syncytial Virus NOT DETECTED NOT DETECTED Final   Bordetella pertussis NOT DETECTED NOT DETECTED Final   Chlamydophila pneumoniae NOT DETECTED NOT DETECTED Final   Mycoplasma pneumoniae NOT DETECTED NOT DETECTED Final  MRSA PCR Screening     Status: None   Collection Time: 01/12/17  4:25 PM  Result Value Ref Range Status   MRSA by PCR NEGATIVE NEGATIVE Final    Comment:        The GeneXpert MRSA Assay (FDA approved for NASAL specimens only), is one component of a comprehensive MRSA colonization surveillance program. It is not intended to diagnose MRSA infection nor to guide or monitor treatment for MRSA infections.      Time coordinating discharge: 35 minutes  SIGNED:  Pamella Pert, MD  [facility] 01/15/2017, 2:10 PM Pager [phone]  If 7PM-7AM, please contact night-coverage [email or web address] Password [email or web address]

## 2017-01-18 NOTE — Telephone Encounter (Signed)
Last refill was 11/30/16 

## 2017-01-18 NOTE — Telephone Encounter (Signed)
Faxed

## 2017-01-31 ENCOUNTER — Emergency Department (HOSPITAL_COMMUNITY): Payer: 59

## 2017-01-31 ENCOUNTER — Inpatient Hospital Stay (HOSPITAL_COMMUNITY)
Admission: EM | Admit: 2017-01-31 | Discharge: 2017-02-04 | DRG: 190 | Disposition: A | Discharge status: Home / Self Care | Payer: 59 | Attending: Family Medicine | Admitting: Family Medicine

## 2017-01-31 ENCOUNTER — Encounter (HOSPITAL_COMMUNITY): Payer: Self-pay | Admitting: Internal Medicine

## 2017-01-31 DIAGNOSIS — R069 Unspecified abnormalities of breathing: Secondary | ICD-10-CM | POA: Diagnosis not present

## 2017-01-31 DIAGNOSIS — D696 Thrombocytopenia, unspecified: Secondary | ICD-10-CM | POA: Diagnosis not present

## 2017-01-31 DIAGNOSIS — J9612 Chronic respiratory failure with hypercapnia: Secondary | ICD-10-CM

## 2017-01-31 DIAGNOSIS — J449 Chronic obstructive pulmonary disease, unspecified: Secondary | ICD-10-CM | POA: Diagnosis present

## 2017-01-31 DIAGNOSIS — I110 Hypertensive heart disease with heart failure: Secondary | ICD-10-CM | POA: Diagnosis present

## 2017-01-31 DIAGNOSIS — Z8701 Personal history of pneumonia (recurrent): Secondary | ICD-10-CM | POA: Diagnosis not present

## 2017-01-31 DIAGNOSIS — I5032 Chronic diastolic (congestive) heart failure: Secondary | ICD-10-CM | POA: Diagnosis present

## 2017-01-31 DIAGNOSIS — Z86718 Personal history of other venous thrombosis and embolism: Secondary | ICD-10-CM

## 2017-01-31 DIAGNOSIS — Z8249 Family history of ischemic heart disease and other diseases of the circulatory system: Secondary | ICD-10-CM

## 2017-01-31 DIAGNOSIS — E1143 Type 2 diabetes mellitus with diabetic autonomic (poly)neuropathy: Secondary | ICD-10-CM | POA: Diagnosis present

## 2017-01-31 DIAGNOSIS — Z811 Family history of alcohol abuse and dependence: Secondary | ICD-10-CM | POA: Diagnosis not present

## 2017-01-31 DIAGNOSIS — J9621 Acute and chronic respiratory failure with hypoxia: Secondary | ICD-10-CM

## 2017-01-31 DIAGNOSIS — I2721 Secondary pulmonary arterial hypertension: Secondary | ICD-10-CM | POA: Diagnosis present

## 2017-01-31 DIAGNOSIS — Z87891 Personal history of nicotine dependence: Secondary | ICD-10-CM | POA: Diagnosis not present

## 2017-01-31 DIAGNOSIS — E662 Morbid (severe) obesity with alveolar hypoventilation: Secondary | ICD-10-CM | POA: Diagnosis present

## 2017-01-31 DIAGNOSIS — I509 Heart failure, unspecified: Secondary | ICD-10-CM | POA: Diagnosis not present

## 2017-01-31 DIAGNOSIS — K3184 Gastroparesis: Secondary | ICD-10-CM

## 2017-01-31 DIAGNOSIS — Z6837 Body mass index (BMI) 37.0-37.9, adult: Secondary | ICD-10-CM | POA: Diagnosis not present

## 2017-01-31 DIAGNOSIS — Z86711 Personal history of pulmonary embolism: Secondary | ICD-10-CM

## 2017-01-31 DIAGNOSIS — J441 Chronic obstructive pulmonary disease with (acute) exacerbation: Secondary | ICD-10-CM | POA: Diagnosis not present

## 2017-01-31 DIAGNOSIS — I50812 Chronic right heart failure: Secondary | ICD-10-CM | POA: Diagnosis present

## 2017-01-31 DIAGNOSIS — G4733 Obstructive sleep apnea (adult) (pediatric): Secondary | ICD-10-CM | POA: Diagnosis present

## 2017-01-31 DIAGNOSIS — E669 Obesity, unspecified: Secondary | ICD-10-CM | POA: Diagnosis not present

## 2017-01-31 DIAGNOSIS — R0602 Shortness of breath: Secondary | ICD-10-CM | POA: Diagnosis not present

## 2017-01-31 DIAGNOSIS — K7460 Unspecified cirrhosis of liver: Secondary | ICD-10-CM | POA: Diagnosis present

## 2017-01-31 DIAGNOSIS — E1169 Type 2 diabetes mellitus with other specified complication: Secondary | ICD-10-CM | POA: Diagnosis not present

## 2017-01-31 DIAGNOSIS — Z7952 Long term (current) use of systemic steroids: Secondary | ICD-10-CM | POA: Diagnosis not present

## 2017-01-31 DIAGNOSIS — J9622 Acute and chronic respiratory failure with hypercapnia: Secondary | ICD-10-CM | POA: Diagnosis not present

## 2017-01-31 DIAGNOSIS — Z9981 Dependence on supplemental oxygen: Secondary | ICD-10-CM | POA: Diagnosis not present

## 2017-01-31 DIAGNOSIS — I5033 Acute on chronic diastolic (congestive) heart failure: Secondary | ICD-10-CM | POA: Diagnosis not present

## 2017-01-31 DIAGNOSIS — K760 Fatty (change of) liver, not elsewhere classified: Secondary | ICD-10-CM | POA: Diagnosis present

## 2017-01-31 DIAGNOSIS — F1010 Alcohol abuse, uncomplicated: Secondary | ICD-10-CM | POA: Diagnosis not present

## 2017-01-31 DIAGNOSIS — G44209 Tension-type headache, unspecified, not intractable: Secondary | ICD-10-CM | POA: Diagnosis not present

## 2017-01-31 DIAGNOSIS — R062 Wheezing: Secondary | ICD-10-CM | POA: Diagnosis not present

## 2017-01-31 LAB — CBC WITH DIFFERENTIAL/PLATELET
Basophils Absolute: 0 10*3/uL (ref 0.0–0.1)
Basophils Relative: 0 %
Eosinophils Absolute: 0.2 10*3/uL (ref 0.0–0.7)
Eosinophils Relative: 2 %
HEMATOCRIT: 43.9 % (ref 36.0–46.0)
HEMOGLOBIN: 13.7 g/dL (ref 12.0–15.0)
LYMPHS ABS: 1.4 10*3/uL (ref 0.7–4.0)
LYMPHS PCT: 17 %
MCH: 32.2 pg (ref 26.0–34.0)
MCHC: 31.2 g/dL (ref 30.0–36.0)
MCV: 103.3 fL — AB (ref 78.0–100.0)
MONOS PCT: 4 %
Monocytes Absolute: 0.4 10*3/uL (ref 0.1–1.0)
NEUTROS ABS: 6.7 10*3/uL (ref 1.7–7.7)
NEUTROS PCT: 77 %
Platelets: 97 10*3/uL — ABNORMAL LOW (ref 150–400)
RBC: 4.25 MIL/uL (ref 3.87–5.11)
RDW: 13.9 % (ref 11.5–15.5)
WBC: 8.6 10*3/uL (ref 4.0–10.5)

## 2017-01-31 LAB — COMPREHENSIVE METABOLIC PANEL
ALBUMIN: 4.1 g/dL (ref 3.5–5.0)
ALT: 50 U/L (ref 14–54)
ANION GAP: 9 (ref 5–15)
AST: 44 U/L — ABNORMAL HIGH (ref 15–41)
Alkaline Phosphatase: 107 U/L (ref 38–126)
BUN: 13 mg/dL (ref 6–20)
CHLORIDE: 95 mmol/L — AB (ref 101–111)
CO2: 38 mmol/L — ABNORMAL HIGH (ref 22–32)
Calcium: 8.7 mg/dL — ABNORMAL LOW (ref 8.9–10.3)
Creatinine, Ser: 0.53 mg/dL (ref 0.44–1.00)
GFR calc non Af Amer: >60 mL/min (ref >60)
GLUCOSE: 212 mg/dL — AB (ref 65–99)
POTASSIUM: 3.6 mmol/L (ref 3.5–5.1)
SODIUM: 142 mmol/L (ref 135–145)
Total Bilirubin: 0.7 mg/dL (ref 0.3–1.2)
Total Protein: 6.9 g/dL (ref 6.5–8.1)

## 2017-01-31 LAB — URINALYSIS, ROUTINE W REFLEX MICROSCOPIC
Bilirubin Urine: NEGATIVE
Glucose, UA: >=500 mg/dL — AB
HGB URINE DIPSTICK: NEGATIVE
Ketones, ur: NEGATIVE mg/dL
LEUKOCYTES UA: NEGATIVE
NITRITE: NEGATIVE
PH: 5 (ref 5.0–8.0)
Protein, ur: NEGATIVE mg/dL
Specific Gravity, Urine: 1.015 (ref 1.005–1.030)

## 2017-01-31 LAB — PREGNANCY, URINE: Preg Test, Ur: NEGATIVE

## 2017-01-31 LAB — TROPONIN I
Troponin I: <0.03 ng/mL (ref <0.03)
Troponin I: <0.03 ng/mL (ref <0.03)

## 2017-01-31 LAB — GLUCOSE, CAPILLARY: Glucose-Capillary: 278 mg/dL — ABNORMAL HIGH (ref 65–99)

## 2017-01-31 MED ORDER — TRAMADOL HCL 50 MG PO TABS
50.0000 mg | ORAL_TABLET | Freq: Four times a day (QID) | ORAL | Status: DC | PRN
  Administered 2017-01-31 – 2017-02-04 (×10): 50 mg via ORAL
  Filled 2017-01-31 (×10): qty 1

## 2017-01-31 MED ORDER — PANTOPRAZOLE SODIUM 40 MG PO TBEC
40.0000 mg | DELAYED_RELEASE_TABLET | Freq: Two times a day (BID) | ORAL | Status: DC
  Administered 2017-02-01 – 2017-02-04 (×7): 40 mg via ORAL
  Filled 2017-01-31 (×7): qty 1

## 2017-01-31 MED ORDER — ORAL CARE MOUTH RINSE
15.0000 mL | Freq: Two times a day (BID) | OROMUCOSAL | Status: DC
  Administered 2017-02-01 – 2017-02-04 (×2): 15 mL via OROMUCOSAL

## 2017-01-31 MED ORDER — IPRATROPIUM-ALBUTEROL 0.5-2.5 (3) MG/3ML IN SOLN
3.0000 mL | Freq: Four times a day (QID) | RESPIRATORY_TRACT | Status: DC
  Administered 2017-01-31 – 2017-02-03 (×9): 3 mL via RESPIRATORY_TRACT
  Filled 2017-01-31 (×10): qty 3

## 2017-01-31 MED ORDER — HYDROCODONE-ACETAMINOPHEN 5-325 MG PO TABS
1.0000 | ORAL_TABLET | ORAL | Status: DC | PRN
  Administered 2017-02-01: 1 via ORAL
  Filled 2017-01-31: qty 1

## 2017-01-31 MED ORDER — SODIUM CHLORIDE 0.9 % IV SOLN: 250.0000 mL | INTRAVENOUS | Status: DC | PRN

## 2017-01-31 MED ORDER — ALBUTEROL SULFATE (2.5 MG/3ML) 0.083% IN NEBU: 2.5000 mg | INHALATION_SOLUTION | RESPIRATORY_TRACT | Status: DC | PRN

## 2017-01-31 MED ORDER — CHLORHEXIDINE GLUCONATE 0.12 % MT SOLN
15.0000 mL | Freq: Two times a day (BID) | OROMUCOSAL | Status: DC
  Administered 2017-01-31 – 2017-02-04 (×8): 15 mL via OROMUCOSAL
  Filled 2017-01-31 (×7): qty 15

## 2017-01-31 MED ORDER — GABAPENTIN 300 MG PO CAPS
600.0000 mg | ORAL_CAPSULE | Freq: Three times a day (TID) | ORAL | Status: DC
  Administered 2017-01-31 – 2017-02-04 (×11): 600 mg via ORAL
  Filled 2017-01-31 (×11): qty 2

## 2017-01-31 MED ORDER — ONDANSETRON HCL 4 MG PO TABS: 4.0000 mg | ORAL_TABLET | Freq: Four times a day (QID) | ORAL | Status: DC | PRN

## 2017-01-31 MED ORDER — PREDNISONE 20 MG PO TABS
40.0000 mg | ORAL_TABLET | Freq: Every day | ORAL | Status: DC
  Administered 2017-02-01 – 2017-02-04 (×4): 40 mg via ORAL
  Filled 2017-01-31 (×4): qty 2

## 2017-01-31 MED ORDER — SENNA 8.6 MG PO TABS
1.0000 | ORAL_TABLET | Freq: Two times a day (BID) | ORAL | Status: DC
  Administered 2017-01-31 – 2017-02-01 (×2): 8.6 mg via ORAL
  Filled 2017-01-31 (×8): qty 1

## 2017-01-31 MED ORDER — BISACODYL 10 MG RE SUPP: 10.0000 mg | Freq: Every day | RECTAL | Status: DC | PRN

## 2017-01-31 MED ORDER — ACETAMINOPHEN 650 MG RE SUPP: 650.0000 mg | Freq: Four times a day (QID) | RECTAL | Status: DC | PRN

## 2017-01-31 MED ORDER — ROFLUMILAST 500 MCG PO TABS
500.0000 ug | ORAL_TABLET | Freq: Every day | ORAL | Status: DC
  Administered 2017-02-01 – 2017-02-04 (×4): 500 ug via ORAL
  Filled 2017-01-31 (×4): qty 1

## 2017-01-31 MED ORDER — SODIUM CHLORIDE 0.9% FLUSH
3.0000 mL | Freq: Two times a day (BID) | INTRAVENOUS | Status: DC
  Administered 2017-01-31 – 2017-02-01 (×2): 3 mL via INTRAVENOUS
  Administered 2017-02-01: 10 mL via INTRAVENOUS
  Administered 2017-02-02 (×2): 3 mL via INTRAVENOUS

## 2017-01-31 MED ORDER — ACETAMINOPHEN 325 MG PO TABS
650.0000 mg | ORAL_TABLET | Freq: Four times a day (QID) | ORAL | Status: DC | PRN
  Administered 2017-02-01 – 2017-02-02 (×2): 650 mg via ORAL
  Filled 2017-01-31 (×2): qty 2

## 2017-01-31 MED ORDER — INSULIN ASPART 100 UNIT/ML ~~LOC~~ SOLN
0.0000 [IU] | Freq: Three times a day (TID) | SUBCUTANEOUS | Status: DC
  Administered 2017-02-01: 20 [IU] via SUBCUTANEOUS
  Administered 2017-02-01 (×2): 11 [IU] via SUBCUTANEOUS
  Administered 2017-02-02: 4 [IU] via SUBCUTANEOUS
  Administered 2017-02-02: 20 [IU] via SUBCUTANEOUS
  Administered 2017-02-02: 7 [IU] via SUBCUTANEOUS
  Administered 2017-02-03: 15 [IU] via SUBCUTANEOUS
  Administered 2017-02-03: 4 [IU] via SUBCUTANEOUS
  Administered 2017-02-03: 15 [IU] via SUBCUTANEOUS
  Administered 2017-02-04: 7 [IU] via SUBCUTANEOUS
  Administered 2017-02-04: 4 [IU] via SUBCUTANEOUS

## 2017-01-31 MED ORDER — ONDANSETRON HCL 4 MG/2ML IJ SOLN
4.0000 mg | Freq: Four times a day (QID) | INTRAMUSCULAR | Status: DC | PRN
  Administered 2017-02-01: 4 mg via INTRAVENOUS
  Filled 2017-01-31: qty 2

## 2017-01-31 MED ORDER — ALBUTEROL (5 MG/ML) CONTINUOUS INHALATION SOLN
10.0000 mg/h | INHALATION_SOLUTION | Freq: Once | RESPIRATORY_TRACT | Status: AC
  Administered 2017-01-31: 10 mg/h via RESPIRATORY_TRACT
  Filled 2017-01-31: qty 20

## 2017-01-31 MED ORDER — SODIUM CHLORIDE 0.9% FLUSH
3.0000 mL | Freq: Two times a day (BID) | INTRAVENOUS | Status: DC
  Administered 2017-01-31: 3 mL via INTRAVENOUS
  Administered 2017-02-01: 10 mL via INTRAVENOUS
  Administered 2017-02-01 – 2017-02-04 (×6): 3 mL via INTRAVENOUS

## 2017-01-31 MED ORDER — ENOXAPARIN SODIUM 40 MG/0.4ML ~~LOC~~ SOLN
40.0000 mg | SUBCUTANEOUS | Status: DC
  Administered 2017-01-31 – 2017-02-03 (×4): 40 mg via SUBCUTANEOUS
  Filled 2017-01-31 (×4): qty 0.4

## 2017-01-31 MED ORDER — SODIUM CHLORIDE 0.9% FLUSH: 3.0000 mL | INTRAVENOUS | Status: DC | PRN

## 2017-01-31 MED ORDER — POTASSIUM CHLORIDE CRYS ER 20 MEQ PO TBCR
20.0000 meq | EXTENDED_RELEASE_TABLET | Freq: Every day | ORAL | Status: DC
  Administered 2017-02-01 – 2017-02-04 (×4): 20 meq via ORAL
  Filled 2017-01-31 (×4): qty 1

## 2017-01-31 MED ORDER — INSULIN ASPART 100 UNIT/ML ~~LOC~~ SOLN
0.0000 [IU] | Freq: Every day | SUBCUTANEOUS | Status: DC
  Administered 2017-01-31: 3 [IU] via SUBCUTANEOUS
  Administered 2017-02-01: 4 [IU] via SUBCUTANEOUS
  Administered 2017-02-02 – 2017-02-03 (×2): 2 [IU] via SUBCUTANEOUS

## 2017-01-31 MED ORDER — POLYETHYLENE GLYCOL 3350 17 G PO PACK: 17.0000 g | PACK | Freq: Every day | ORAL | Status: DC | PRN

## 2017-01-31 MED ORDER — DOXYCYCLINE HYCLATE 100 MG PO TABS
100.0000 mg | ORAL_TABLET | Freq: Two times a day (BID) | ORAL | Status: DC
  Administered 2017-01-31 – 2017-02-04 (×8): 100 mg via ORAL
  Filled 2017-01-31 (×8): qty 1

## 2017-01-31 MED ORDER — GUAIFENESIN ER 600 MG PO TB12
600.0000 mg | ORAL_TABLET | Freq: Two times a day (BID) | ORAL | Status: DC
  Administered 2017-01-31 – 2017-02-01 (×2): 600 mg via ORAL
  Filled 2017-01-31 (×2): qty 1

## 2017-01-31 MED ORDER — FUROSEMIDE 10 MG/ML IJ SOLN
40.0000 mg | Freq: Every day | INTRAMUSCULAR | Status: DC
  Administered 2017-01-31: 40 mg via INTRAVENOUS
  Filled 2017-01-31: qty 4

## 2017-01-31 NOTE — ED Notes (Signed)
ED Provider at bedside. 

## 2017-01-31 NOTE — ED Notes (Signed)
X-ray at bedside

## 2017-01-31 NOTE — ED Notes (Signed)
Bed: WA06 Expected date:  Expected time:  Means of arrival:  Comments: 41 yo f SOB

## 2017-01-31 NOTE — ED Notes (Signed)
Resp. Paged

## 2017-01-31 NOTE — ED Provider Notes (Signed)
[facility]-EMERGENCY DEPT Provider Note   CSN: [id number] Arrival date & time: 01/31/17  1652     History   Chief Complaint Chief Complaint  Patient presents with  . Shortness of Breath    HPI IA LEEB is a 41 y.o. female.  Pt presents to the ED with sob.  The pt said that she was discharged from [facility] for the same about 2 weeks ago.  She finished her prednisone taper today.  Pt said she took a nap with her bipap.  She woke up very sob.  She checked her O2 and it was 39.  She put on her nasal cannula and sats came up to 60.  She called EMS who put her on a NRB.  The pt was given solumedrol, 10 mg albuterol, 0.5 mg of atrovent, 2 g MG.  Pt is feeling a little better, but is still very sob.  Pt denies any recent fevers.      Past Medical History:  Diagnosis Date  . Axillary adenopathy    right axillary adenopathy noted on CT chest (03/06/2012)  . Chronic right-sided CHF (congestive heart failure) 03/23/2012   with cor pulmonale. Last RHC 03/2012  . COPD (chronic obstructive pulmonary disease) (HCC)    PFTS 03/08/12: fev1 0.58L/1%, FVC 1.18/33%, Ratop 49 and c/w ssevere obstruction. 21% BD response on FVC, RV 219%, DLCO 11/54%  . DVT of upper extremity (deep vein thrombosis) [facility]) April 2013   right subclavian // Unclear precipitating cause - possibly significant right heart failure, with vascular stasis potentially predisposing to clotting.    . Exertional shortness of breath   . Hepatic cirrhosis (HCC)    Questionable history of - Noted on CT abdomen (03/2012) - thought to be due to vascular congestion from right heart failure +/- patient's history of alcohol abuse  . History of alcoholism (HCC)   . History of chronic bronchitis   . Irregular menses   . Migraine    "~ 1/yr" (05/03/2013)  . Moderate to severe pulmonary hypertension April 2013   Cardiac cath on 03/06/12 - 1. Elevated pulmonary artery pressures, right sided filling pressures.,  2. PA: 64/45 (mean 53)    . On home  oxygen therapy    "2-3 L 24/7" (05/03/2013)  . OSA (obstructive sleep apnea)    wears noctural BiPAP (05/03/2013)  . Periodontal disease   . Pneumonia ~ 1985; 01/2011  . Pulmonary embolus [facility]) April 2013   Precipitating cause unclear. Was treated with coumadin from April-June 2013.    Patient Active Problem List   Diagnosis Date Noted  . Acute on chronic diastolic CHF (congestive heart failure) (HCC) 01/31/2017  . Alcohol abuse 01/31/2017  . Respiratory failure with hypercapnia (HCC) 01/11/2017  . Acute on chronic respiratory failure with hypoxia and hypercapnia (HCC)   . COPD (chronic obstructive pulmonary disease) with chronic bronchitis (HCC)   . Tracheomalacia   . Palliative care by specialist   . Anxiety state   . Advance directive declined by patient   . Goals of care, counseling/discussion   . Diabetes mellitus with complication (HCC)   . Chronic hoarseness 07/27/2016  . Nausea with vomiting 05/31/2016  . Diabetic neuropathy (HCC) 04/30/2016  . Diabetes mellitus type 2 in obese (HCC) 04/10/2016  . SOB (shortness of breath)   . OSA treated with BiPAP 04/02/2016  . COPD exacerbation (HCC) 04/01/2016  . Chronic diastolic heart failure (HCC) 01/25/2016  . Chronic respiratory failure with hypoxia (HCC) 01/21/2016  . Right heart  failure   . PAH (pulmonary artery hypertension)   . COPD (chronic obstructive pulmonary disease) (HCC) 12/16/2015  . Constipation 11/18/2015  . Morbid obesity (HCC) 11/18/2015  . Lung nodule 01/30/2015  . Pulmonary mass 01/13/2015  . Other emphysema (HCC)   . Gastroparesis   . Esophageal reflux   . Obstipation   . Dehydration 12/14/2014  . Abdominal pain 12/14/2014  . Fever 12/14/2014  . Dyspnea 09/18/2013  . Hypokalemia 02/28/2013  . Acute on chronic respiratory failure with hypoxia (HCC) 02/28/2013  . Financial difficulties 02/28/2013  . Obesity hypoventilation syndrome (HCC) 02/28/2013  . Elevated troponin 02/28/2013  . Myalgia 02/28/2013   . Influenza with respiratory manifestations 02/28/2013  . Anovulation 06/30/2012  . Preventative health care 05/05/2012  . Irregular menstrual cycle 04/27/2012  . Chronic right-sided CHF (congestive heart failure) 03/23/2012  . Pulmonary hypertension 03/23/2012  . Cor pulmonale (HCC) 03/21/2012  . Chronic respiratory failure with hypercapnia (HCC) 03/21/2012  . OSA on BiPAP 03/19/2012  . Axillary lymphadenopathy 03/06/2012  . Congenital heart disease 01/28/2012  . Obesity 01/28/2012  . Dental caries 01/28/2012    Past Surgical History:  Procedure Laterality Date  . APPENDECTOMY  05/03/2013  . CARDIAC CATHETERIZATION  03/2012  . CARDIAC CATHETERIZATION N/A 01/08/2016   Procedure: Right Heart Cath;  Surgeon: Dolores Patty, MD;  Location: [facility] INVASIVE CV LAB;  Service: Cardiovascular;  Laterality: N/A;  . CARDIAC SURGERY  1977/10/10   "my heart was backwards" (05/03/2013)  . LAPAROSCOPIC APPENDECTOMY N/A 05/03/2013   Procedure: APPENDECTOMY LAPAROSCOPIC;  Surgeon: Cherylynn Ridges, MD;  Location: [facility] OR;  Service: General;  Laterality: N/A;  . LUNG SURGERY  16-Dec-1976  . RIGHT HEART CATHETERIZATION N/A 03/07/2012   Procedure: RIGHT HEART CATH;  Surgeon: Kathleene Hazel, MD;  Location: [facility] CATH LAB;  Service: Cardiovascular;  Laterality: N/A;    OB History    No data available       Home Medications    Prior to Admission medications   Medication Sig Start Date End Date Taking? Authorizing Provider  albuterol (PROVENTIL HFA;VENTOLIN HFA) 108 (90 BASE) MCG/ACT inhaler Inhale 2 puffs into the lungs every 6 (six) hours as needed for wheezing or shortness of breath. 09/06/14  Yes Kalman Shan, MD  BREO ELLIPTA 100-25 MCG/INH AEPB INHALE 1 PUFF INTO THE LUNGS DAILY. 06/03/16  Yes Kalman Shan, MD  gabapentin (NEURONTIN) 300 MG capsule TAKE 2 CAPSULES BY MOUTH 3 TIMES DAILY. 12/14/16  Yes Veryl Speak, FNP  pantoprazole (PROTONIX) 40 MG tablet Take 1 tablet (40 mg total) by  mouth 2 (two) times daily before a meal. Patient taking differently: Take 40 mg by mouth daily.  07/22/16  Yes Jeralyn Bennett, MD  potassium chloride SA (K-DUR,KLOR-CON) 20 MEQ tablet Take 1 tablet (20 mEq total) by mouth daily. 01/29/16  Yes Bevelyn Buckles Bensimhon, MD  predniSONE (DELTASONE) 5 MG tablet Take 5 mg by mouth daily with breakfast.   Yes Historical Provider, MD  roflumilast (DALIRESP) 500 MCG TABS tablet Take 500 mcg by mouth daily.   Yes Historical Provider, MD  SPIRIVA RESPIMAT 1.25 MCG/ACT AERS INHALE 2 PUFFS INTO THE LUNGS 2 (TWO) TIMES DAILY. Patient taking differently: Inhale 2 puffs into the lungs daily.  08/16/16  Yes Kalman Shan, MD  torsemide (DEMADEX) 20 MG tablet Take 1 tablet (20 mg total) by mouth daily. 12/03/16  Yes Kalman Shan, MD  traMADol (ULTRAM) 50 MG tablet TAKE 1 TABLET BY MOUTH EVERY 6 HOURS AS NEEDED FOR SEVERE  PAIN 01/18/17  Yes Veryl Speak, FNP    Family History Family History  Problem Relation Age of Onset  . Hypertension Father   . Heart disease Father     CHF; died age 68   . Alcohol abuse Father   . Other Brother     died age 110 y.o overdose    Social History Social History  Substance Use Topics  . Smoking status: Former Smoker    Packs/day: 1.00    Years: 25.00    Types: Cigarettes    Quit date: 02/18/2012  . Smokeless tobacco: Never Used  . Alcohol use 1.8 oz/week    3 Shots of liquor per week     Comment: previously drinking 1 pint 3-4 days a week for 2002-2013, but in 2014, occasional drinking now      Allergies   Patient has no known allergies.   Review of Systems Review of Systems  Respiratory: Positive for shortness of breath and wheezing.   All other systems reviewed and are negative.    Physical Exam Updated Vital Signs BP (!) 106/57 (BP Location: Right Arm)   Pulse 90   Temp 98.3 F (36.8 C) (Oral)   Resp 15   Ht  (1.575 m)   Wt 204 lb 5.9 oz (92.7 kg)   SpO2 93%   BMI 37.38 kg/m   Physical  Exam  Constitutional: She is oriented to person, place, and time. She appears well-developed. She appears distressed.  HENT:  Head: Normocephalic and atraumatic.  Right Ear: External ear normal.  Left Ear: External ear normal.  Nose: Nose normal.  Mouth/Throat: Oropharynx is clear and moist.  Eyes: Conjunctivae and EOM are normal. Pupils are equal, round, and reactive to light.  Neck: Normal range of motion. Neck supple.  Cardiovascular: Regular rhythm, normal heart sounds and intact distal pulses.  Tachycardia present.   Pulmonary/Chest: Accessory muscle usage present. Tachypnea noted. She is in respiratory distress. She has wheezes.  Abdominal: Soft. Bowel sounds are normal.  Musculoskeletal: Normal range of motion.  Neurological: She is alert and oriented to person, place, and time.  Skin: Skin is warm. Capillary refill takes less than 2 seconds.  Psychiatric: She has a normal mood and affect. Her behavior is normal. Judgment and thought content normal.  Nursing note and vitals reviewed.    ED Treatments / Results  Labs (all labs ordered are listed, but only abnormal results are displayed) Labs Reviewed  COMPREHENSIVE METABOLIC PANEL - Abnormal; Notable for the following:       Result Value   Chloride 95 (*)    CO2 38 (*)    Glucose, Bld 212 (*)    Calcium 8.7 (*)    AST 44 (*)    All other components within normal limits  CBC WITH DIFFERENTIAL/PLATELET - Abnormal; Notable for the following:    MCV 103.3 (*)    Platelets 97 (*)    All other components within normal limits  BLOOD GAS, ARTERIAL - Abnormal; Notable for the following:    pH, Arterial 7.283 (*)    pCO2 arterial 87.5 (*)    pO2, Arterial 70.3 (*)    Bicarbonate 40.1 (*)    Acid-Base Excess 9.8 (*)    All other components within normal limits  URINALYSIS, ROUTINE W REFLEX MICROSCOPIC - Abnormal; Notable for the following:    Color, Urine STRAW (*)    Glucose, UA >=500 (*)    Bacteria, UA RARE (*)  Squamous Epithelial / LPF 0-5 (*)    All other components within normal limits  GLUCOSE, CAPILLARY - Abnormal; Notable for the following:    Glucose-Capillary 278 (*)    All other components within normal limits  TROPONIN I  PREGNANCY, URINE  TROPONIN I  TROPONIN I  TROPONIN I  HIV ANTIBODY (ROUTINE TESTING)  MAGNESIUM  PHOSPHORUS  TSH  COMPREHENSIVE METABOLIC PANEL  CBC  PROTIME-INR  HEMOGLOBIN A1C    EKG  EKG Interpretation  Date/Time:  Monday January 31 2017 17:10:39 EST Ventricular Rate:  112 PR Interval:    QRS Duration: 88 QT Interval:  333 QTC Calculation: 455 R Axis:   99 Text Interpretation:  Sinus tachycardia Biatrial enlargement Borderline right axis deviation Low voltage, precordial leads No significant change since last tracing Confirmed by [facility] MD, Ranulfo Kall ([id number]) on 01/31/2017 5:21:01 PM       Radiology Dg Chest Port 1 View  Result Date: 01/31/2017 CLINICAL DATA:  Shortness of breath. EXAM: PORTABLE CHEST 1 VIEW COMPARISON:  01/11/2017.  08/25/2016.  07/18/2016.  11/13/2015. FINDINGS: Mediastinum and hilar structures are stable . Stable cardiomegaly. Mild basilar interstitial prominence. Small right pleural effusion. Left costophrenic angle not imaged. Mild CHF cannot be excluded. Left base pleural-parenchymal thickening again noted consistent scarring. IMPRESSION: 1. Cardiomegaly with mild basilar interstitial prominence of small right pleural effusion. Mild CHF cannot be excluded. 2. Left base pleuroparenchymal thickening again noted consistent with scarring. Electronically Signed   By: Maisie Fus  Register   On: 01/31/2017 17:31    Procedures Procedures (including critical care time)  Medications Ordered in ED Medications  traMADol (ULTRAM) tablet 50 mg (50 mg Oral Given 01/31/17 2116)  gabapentin (NEURONTIN) capsule 600 mg (600 mg Oral Given 01/31/17 2114)  pantoprazole (PROTONIX) EC tablet 40 mg (not administered)  roflumilast (DALIRESP) tablet 500 mcg  (not administered)  potassium chloride SA (K-DUR,KLOR-CON) CR tablet 20 mEq (not administered)  sodium chloride flush (NS) 0.9 % injection 3 mL (3 mLs Intravenous Given 01/31/17 2122)  acetaminophen (TYLENOL) tablet 650 mg (not administered)    Or  acetaminophen (TYLENOL) suppository 650 mg (not administered)  HYDROcodone-acetaminophen (NORCO/VICODIN) 5-325 MG per tablet 1-2 tablet (not administered)  ondansetron (ZOFRAN) tablet 4 mg (not administered)    Or  ondansetron (ZOFRAN) injection 4 mg (not administered)  insulin aspart (novoLOG) injection 0-5 Units (3 Units Subcutaneous Given 01/31/17 2118)  furosemide (LASIX) injection 40 mg (40 mg Intravenous Given 01/31/17 2122)  doxycycline (VIBRA-TABS) tablet 100 mg (100 mg Oral Given 01/31/17 2114)  enoxaparin (LOVENOX) injection 40 mg (40 mg Subcutaneous Given 01/31/17 2117)  predniSONE (DELTASONE) tablet 40 mg (not administered)  sodium chloride flush (NS) 0.9 % injection 3 mL (3 mLs Intravenous Given 01/31/17 2122)  sodium chloride flush (NS) 0.9 % injection 3 mL (not administered)  0.9 %  sodium chloride infusion (not administered)  senna (SENOKOT) tablet 8.6 mg (8.6 mg Oral Given 01/31/17 2115)  polyethylene glycol (MIRALAX / GLYCOLAX) packet 17 g (not administered)  bisacodyl (DULCOLAX) suppository 10 mg (not administered)  ipratropium-albuterol (DUONEB) 0.5-2.5 (3) MG/3ML nebulizer solution 3 mL (3 mLs Nebulization Given 01/31/17 2101)  albuterol (PROVENTIL) (2.5 MG/3ML) 0.083% nebulizer solution 2.5 mg (not administered)  guaiFENesin (MUCINEX) 12 hr tablet 600 mg (600 mg Oral Given 01/31/17 2115)  insulin aspart (novoLOG) injection 0-20 Units (not administered)  chlorhexidine (PERIDEX) 0.12 % solution 15 mL (15 mLs Mouth Rinse Given 01/31/17 2121)  MEDLINE mouth rinse (not administered)  albuterol (PROVENTIL,VENTOLIN) solution continuous neb (10 mg/hr  Nebulization Given 01/31/17 1812)     Initial Impression / Assessment and Plan / ED  Course  I have reviewed the triage vital signs and the nursing notes.  Pertinent labs & imaging results that were available during my care of the patient were reviewed by me and considered in my medical decision making (see chart for details).  Pt given additional nebs.  She was placed on Bipap which has helped her feel better.  She was d/w hospitalist (Dr. Adela Glimpse) for admission.   CRITICAL CARE Performed by: Jacalyn Lefevre   Total critical care time: 30 minutes  Critical care time was exclusive of separately billable procedures and treating other patients.  Critical care was necessary to treat or prevent imminent or life-threatening deterioration.  Critical care was time spent personally by me on the following activities: development of treatment plan with patient and/or surrogate as well as nursing, discussions with consultants, evaluation of patient's response to treatment, examination of patient, obtaining history from patient or surrogate, ordering and performing treatments and interventions, ordering and review of laboratory studies, ordering and review of radiographic studies, pulse oximetry and re-evaluation of patient's condition.  Final Clinical Impressions(s) / ED Diagnoses   Final diagnoses:  COPD exacerbation (HCC)  Acute on chronic respiratory failure with hypercapnia (HCC)  Acute on chronic respiratory failure with hypoxia [facility])    New Prescriptions Current Discharge Medication List       Jacalyn Lefevre, MD 02/01/17 [phone]

## 2017-01-31 NOTE — ED Triage Notes (Signed)
Pt BIB EMS w/ a hx of end stage COPD and increasing SOB today. Received 10mg  albuterol, 0.5 atrovent, 125 solumedrol, and 2g mag. En route. 20g LAC. Normally wears 2L [place] at home but today bumped up to 4L. Alert and oriented. Just finished a prednisone taper today and was recently released from the hospital for same.

## 2017-01-31 NOTE — Progress Notes (Signed)
RN reviewed medications ordered for pt at this time and confirmed that pt had not already taken home medications. Will continue to monitor.

## 2017-01-31 NOTE — Progress Notes (Signed)
Pt transported from [facility] to 1238 step-down.  Pt remained on BiPAP throughout the trip and remained stable throughout trip.  Pt arrived to 1238, transferred to stepdown bed and BiPAP hooked up to wall.  No notable events occurred throughout trip.

## 2017-01-31 NOTE — H&P (Signed)
Mary [name] LKG:[id number] DOB: [birth year] DOA: 01/31/2017     PCP: Jeanine Luz, FNP   Outpatient Specialists:Pulmonary Ramaswamy   Patient coming from:    home Lives   With family    Chief Complaint: shortness of breath  HPI: Mary [name] is a 41 y.o. female with medical history significant of COPD with oxygen dependency of 3-1/2 L at baseline, DM2, Diastolic CHF, DVT, alcoholism, migraines, pulmonary hypertension, pulmonary embolus, CHF, fatty liver disease.    Presented with worsening shortness of breath at baseline COPD on 3.5 L. States she just finished her prednisone taper after her recent discharge. Reports got short of breath once she got up yesterday today she checked her Pulse ox and it was shoeing 45%. Repots severe fatigue.  She called EMS Patient was given 10 mg of albuterol and 0.5 Atrovent 125 Solu-Medrol and 2 g of magnesium oxygen had to be increased up to 4 L and then required nonrebreather. Denies any fevers or chills no chest pain, no sick contacts, no travels. No leg edema but noted that her face has been swelling. She is unable to check her weight because her scale is broken.   recently admitted for COPD exacerbation discharged to home on 01/15/2017 with 3 days of antibiotic and prednisone taper.  Reports blood sugar is better once off steroids. Repots last EtOH was before her prior admission she have not had any alcohol since.  She has not smoked for past 5 years  Regarding pertinent Chronic problems: His last echogram was in September 2017 showed preserved EF.  Patient has known diabetes worsened but she'll control secondary to steroid use Patient has sleep apnea and uses BiPAP for nighttime and IN ER:  Temp (24hrs), Avg:98.2 F (36.8 C), Min:98.2 F (36.8 C), Max:98.2 F (36.8 C)  RR 20 satting 96% HR 109 BP 105/80 PH 7.283/87.5/70.3 Sodium 142 potassium 3.6 creatinine 0.53 bicarbonate 38 WBC 8.6 hemoglobin 13.7 platelets  97 down from  baseline  Trop < 0.03 Chest x-ray cardiomegaly, small right pleural effusion and mild CHF possibly Following Medications were ordered in ER: Medications  albuterol (PROVENTIL,VENTOLIN) solution continuous neb (10 mg/hr Nebulization Given 01/31/17 1812)     Hospitalist was called for admission for Acute on chronic respiratory failure with hypoxia and hypercarbia secondary to COPD exacerbation with contributing CHF exacerbation  Review of Systems:    Pertinent positives include: shortness of breath at rest. dyspnea on exertion,   Constitutional:  No weight loss, night sweats, Fevers, chills, fatigue, weight loss  HEENT:  No headaches, Difficulty swallowing,Tooth/dental problems,Sore throat,  No sneezing, itching, ear ache, nasal congestion, post nasal drip,  Cardio-vascular:  No chest pain, Orthopnea, PND, anasarca, dizziness, palpitations.no Bilateral lower extremity swelling  GI:  No heartburn, indigestion, abdominal pain, nausea, vomiting, diarrhea, change in bowel habits, loss of appetite, melena, blood in stool, hematemesis Resp:  no  No  No excess mucus, no productive cough, No non-productive cough, No coughing up of blood.No change in color of mucus.No wheezing. Skin:  no rash or lesions. No jaundice GU:  no dysuria, change in color of urine, no urgency or frequency. No straining to urinate.  No flank pain.  Musculoskeletal:  No joint pain or no joint swelling. No decreased range of motion. No back pain.  Psych:  No change in mood or affect. No depression or anxiety. No memory loss.  Neuro: no localizing neurological complaints, no tingling, no weakness, no double vision, no gait abnormality, no slurred speech,  no confusion  As per HPI otherwise 10 point review of systems negative.   Past Medical History: Past Medical History:  Diagnosis Date  . Axillary adenopathy    right axillary adenopathy noted on CT chest (03/06/2012)  . Chronic right-sided CHF (congestive heart  failure) 03/23/2012   with cor pulmonale. Last RHC 03/2012  . COPD (chronic obstructive pulmonary disease) (HCC)    PFTS 03/08/12: fev1 0.58L/1%, FVC 1.18/33%, Ratop 49 and c/w ssevere obstruction. 21% BD response on FVC, RV 219%, DLCO 11/54%  . DVT of upper extremity (deep vein thrombosis) [facility](HCC) April 2013   right subclavian // Unclear precipitating cause - possibly significant right heart failure, with vascular stasis potentially predisposing to clotting.    . Exertional shortness of breath   . Hepatic cirrhosis (HCC)    Questionable history of - Noted on CT abdomen (03/2012) - thought to be due to vascular congestion from right heart failure +/- patient's history of alcohol abuse  . History of alcoholism (HCC)   . History of chronic bronchitis   . Irregular menses   . Migraine    "~ 1/yr" (05/03/2013)  . Moderate to severe pulmonary hypertension April 2013   Cardiac cath on 03/06/12 - 1. Elevated pulmonary artery pressures, right sided filling pressures.,  2. PA: 64/45 (mean 53)    . On home oxygen therapy    "2-3 L 24/7" (05/03/2013)  . OSA (obstructive sleep apnea)    wears noctural BiPAP (05/03/2013)  . Periodontal disease   . Pneumonia ~ 1985; 01/2011  . Pulmonary embolus [facility](HCC) April 2013   Precipitating cause unclear. Was treated with coumadin from April-June 2013.   Past Surgical History:  Procedure Laterality Date  . APPENDECTOMY  05/03/2013  . CARDIAC CATHETERIZATION  03/2012  . CARDIAC CATHETERIZATION N/A 01/08/2016   Procedure: Right Heart Cath;  Surgeon: Dolores Pattyaniel R Bensimhon, MD;  [facility];  Service: Cardiovascular;  Laterality: N/A;  . CARDIAC SURGERY  10/16/1977   "my heart was backwards" (05/03/2013)  . LAPAROSCOPIC APPENDECTOMY N/A 05/03/2013   Procedure: APPENDECTOMY LAPAROSCOPIC;  Surgeon: Cherylynn RidgesJames O Wyatt, MD;  [facility];  Service: General;  Laterality: N/A;  . LUNG SURGERY  10/16/1977  . RIGHT HEART CATHETERIZATION N/A 03/07/2012   Procedure: RIGHT HEART  CATH;  Surgeon: Kathleene Hazelhristopher D McAlhany, MD;  [facility];  Service: Cardiovascular;  Laterality: N/A;     Social History:  Ambulatory   Independently     reports that she quit smoking about 4 years ago. Her smoking use included Cigarettes. She has a 25.00 pack-year smoking history. She has never used smokeless tobacco. She reports that she drinks about 1.8 oz of alcohol per week . She reports that she does not use drugs.  Allergies:  No Known Allergies     Family History:   Family History  Problem Relation Age of Onset  . Hypertension Father   . Heart disease Father     CHF; died age 40   . Alcohol abuse Father   . Other Brother     died age 40 y.o overdose    Medications: Prior to Admission medications   Medication Sig Start Date End Date Taking? Authorizing Provider  albuterol (PROVENTIL HFA;VENTOLIN HFA) 108 (90 BASE) MCG/ACT inhaler Inhale 2 puffs into the lungs every 6 (six) hours as needed for wheezing or shortness of breath. 09/06/14  Yes Kalman ShanMurali Ramaswamy, MD  BREO ELLIPTA 100-25 MCG/INH AEPB INHALE 1 PUFF INTO THE LUNGS DAILY. 06/03/16  Yes Kalman Shan, MD  gabapentin (NEURONTIN) 300 MG capsule TAKE 2 CAPSULES BY MOUTH 3 TIMES DAILY. 12/14/16  Yes Veryl Speak, FNP  pantoprazole (PROTONIX) 40 MG tablet Take 1 tablet (40 mg total) by mouth 2 (two) times daily before a meal. Patient taking differently: Take 40 mg by mouth daily.  07/22/16  Yes Jeralyn Bennett, MD  potassium chloride SA (K-DUR,KLOR-CON) 20 MEQ tablet Take 1 tablet (20 mEq total) by mouth daily. 01/29/16  Yes Bevelyn Buckles Bensimhon, MD  predniSONE (DELTASONE) 5 MG tablet Take 5 mg by mouth daily with breakfast.   Yes Historical Provider, MD  roflumilast (DALIRESP) 500 MCG TABS tablet Take 500 mcg by mouth daily.   Yes Historical Provider, MD  SPIRIVA RESPIMAT 1.25 MCG/ACT AERS INHALE 2 PUFFS INTO THE LUNGS 2 (TWO) TIMES DAILY. Patient taking differently: Inhale 2 puffs into the lungs daily.   08/16/16  Yes Kalman Shan, MD  torsemide (DEMADEX) 20 MG tablet Take 1 tablet (20 mg total) by mouth daily. 12/03/16  Yes Kalman Shan, MD  traMADol (ULTRAM) 50 MG tablet TAKE 1 TABLET BY MOUTH EVERY 6 HOURS AS NEEDED FOR SEVERE PAIN 01/18/17  Yes Veryl Speak, FNP    Physical Exam: Patient Vitals for the past 24 hrs:  BP Temp Temp src Pulse Resp SpO2  01/31/17 1832 105/88 - - 109 20 96 %  01/31/17 1712 145/81 98.2 F (36.8 C) Oral 112 15 90 %    1. General:  in No Acute distress 2. Psychological: Alert and  Oriented 3. Head/ENT:   Moist Mucous Membranes                          Head Non traumatic, neck supple                            Poor Dentition 4. SKIN:   decreased Skin turgor,  Skin clean Dry and intact no rash 5. Heart: Regular rate and rhythm no  Murmur, Rub or gallop 6. Lungs:  some wheezes mild crackles   7. Abdomen: Soft,  non-tender, Non distended 8. Lower extremities: no clubbing, cyanosis, or edema 9. Neurologically Grossly intact, moving all 4 extremities equally  10. MSK: Normal range of motion   body mass index is unknown because there is no height or weight on file.  Labs on Admission:   Labs on Admission: I have personally reviewed following labs and imaging studies  CBC:  Recent Labs Lab 01/31/17 1727  WBC 8.6  NEUTROABS 6.7  HGB 13.7  HCT 43.9  MCV 103.3*  PLT 97*   Basic Metabolic Panel:  Recent Labs Lab 01/31/17 1727  NA 142  K 3.6  CL 95*  CO2 38*  GLUCOSE 212*  BUN 13  CREATININE 0.53  CALCIUM 8.7*   GFR: CrCl cannot be calculated (Unknown ideal weight.). Liver Function Tests:  Recent Labs Lab 01/31/17 1727  AST 44*  ALT 50  ALKPHOS 107  BILITOT 0.7  PROT 6.9  ALBUMIN 4.1   No results for input(s): LIPASE, AMYLASE in the last 168 hours. No results for input(s): AMMONIA in the last 168 hours. Coagulation Profile: No results for input(s): INR, PROTIME in the last 168 hours. Cardiac Enzymes:  Recent  Labs Lab 01/31/17 1727  TROPONINI <0.03   BNP (last 3 results)  Recent Labs  04/20/16 1703  PROBNP 30.0   HbA1C: No results for input(s):  HGBA1C in the last 72 hours. CBG: No results for input(s): GLUCAP in the last 168 hours. Lipid Profile: No results for input(s): CHOL, HDL, LDLCALC, TRIG, CHOLHDL, LDLDIRECT in the last 72 hours. Thyroid Function Tests: No results for input(s): TSH, T4TOTAL, FREET4, T3FREE, THYROIDAB in the last 72 hours. Anemia Panel: No results for input(s): VITAMINB12, FOLATE, FERRITIN, TIBC, IRON, RETICCTPCT in the last 72 hours. Sepsis Labs: @LABRCNTIP (procalcitonin:4,lacticidven:4) )No results found for this or any previous visit (from the past 240 hour(s)).    UA  not ordered  Lab Results  Component Value Date   HGBA1C 5.7 (H) 01/13/2017    CrCl cannot be calculated (Unknown ideal weight.).  BNP (last 3 results)  Recent Labs  04/20/16 1703  PROBNP 30.0     ECG REPORT  Independently reviewed Rate: 112  Rhythm: Sinus tachycardia ST&T Change: No acute ischemic changes   QTC 455  There were no vitals filed for this visit.   Cultures:    Component Value Date/Time   SDES EXPECTORATED SPUTUM 07/20/2016 0930   SDES SPUTUM 07/20/2016 0930   SPECREQUEST NONE 07/20/2016 0930   SPECREQUEST NONE 07/20/2016 0930   CULT Consistent with normal respiratory flora. 07/20/2016 0930   REPTSTATUS 07/20/2016 FINAL 07/20/2016 0930   REPTSTATUS 07/22/2016 FINAL 07/20/2016 0930     Radiological Exams on Admission: Dg Chest Port 1 View  Result Date: 01/31/2017 CLINICAL DATA:  Shortness of breath. EXAM: PORTABLE CHEST 1 VIEW COMPARISON:  01/11/2017.  08/25/2016.  07/18/2016.  11/13/2015. FINDINGS: Mediastinum and hilar structures are stable . Stable cardiomegaly. Mild basilar interstitial prominence. Small right pleural effusion. Left costophrenic angle not imaged. Mild CHF cannot be excluded. Left base pleural-parenchymal thickening again noted  consistent scarring. IMPRESSION: 1. Cardiomegaly with mild basilar interstitial prominence of small right pleural effusion. Mild CHF cannot be excluded. 2. Left base pleuroparenchymal thickening again noted consistent with scarring. Electronically Signed   By: Maisie Fus  Register   On: 01/31/2017 17:31    Chart has been reviewed    Assessment/Plan  41 y.o. female with medical history significant of COPD with oxygen dependency of 3-1/2 L at baseline, DM2, Diastolic CHF, DVT, alcoholism, migraines, pulmonary hypertension, pulmonary embolus, CHF, fatty liver disease admitted for  Acute on chronic respiratory failure with hypoxia and hypercarbia secondary to COPD exacerbation with contributing CHF exacerbation    Present on Admission: . Acute on chronic respiratory failure with hypoxia (HCC) - likely multifactorial likely due to COPD exacerbation,CHFexacerbation and obesity hypoventilation syndrome. With evidence of hypercarbia currently on Bipap tolerating well  . COPD exacerbation (HCC)-  -  - Will initiate: Steroid taper  -   Doxycycline, - Albuterol  PRN, - scheduled duoneb,  Flutter valve  -  Breo or Dulera at discharge   -  Mucinex.  Titrate O2 to saturation >90%. Follow patients respiratory status. -  PCCM consulted will see in AM,  -  BiPAP ordered  for increased work of breathing.  Currently mentating well no evidence of symptomatic hypercarbia  . Diabetes mellitus type 2 in obese (HCC) - order sliding scale insulin . Gastroparesis - currently stable continue home medications . OSA treated with BiPAP - continue BiPAP . Acute on chronic diastolic CHF (congestive heart failure) (HCC) - mild, admit on telemetry, cycle cardiac enzymes, obtain serial ECG, to evaluate for ischemia as a cause of heart failure  monitor daily weight  diurese with IV lasix and monitor orthostatics and creatinine to avoid over diuresis.  Order echogram to evaluate EF and  valves    . Alcohol abuse -  in  remission will monitor for any signs of withdrawal   Other plan as per orders.  DVT prophylaxis:    Lovenox     Code Status:  FULL CODE   as per patient    Family Communication:   Family not at  Bedside    Disposition Plan:    To home once workup is complete and patient is stable                         Would benefit from PT/OT eval prior to DC  ordered                                                 Consults called: PCCM   Admission status:    inpatient      Level of care        SDU      I have spent a total of 67 min on this admission   extra time was spent to discuss case with PCCM  Marvell Tamer 01/31/2017, 7:43 PM    [facility]  Pager [phone]   after 2 AM please page floor coverage PA If 7AM-7PM, please contact the day team taking care of the patient  [email or web address]  Password [email or web address]

## 2017-02-01 ENCOUNTER — Inpatient Hospital Stay (HOSPITAL_COMMUNITY): Payer: 59

## 2017-02-01 DIAGNOSIS — I509 Heart failure, unspecified: Secondary | ICD-10-CM

## 2017-02-01 DIAGNOSIS — J441 Chronic obstructive pulmonary disease with (acute) exacerbation: Principal | ICD-10-CM

## 2017-02-01 DIAGNOSIS — G4733 Obstructive sleep apnea (adult) (pediatric): Secondary | ICD-10-CM

## 2017-02-01 DIAGNOSIS — D696 Thrombocytopenia, unspecified: Secondary | ICD-10-CM

## 2017-02-01 DIAGNOSIS — J9621 Acute and chronic respiratory failure with hypoxia: Secondary | ICD-10-CM

## 2017-02-01 LAB — COMPREHENSIVE METABOLIC PANEL
ALT: 49 U/L (ref 14–54)
ANION GAP: 10 (ref 5–15)
AST: 32 U/L (ref 15–41)
Albumin: 3.9 g/dL (ref 3.5–5.0)
Alkaline Phosphatase: 90 U/L (ref 38–126)
BUN: 9 mg/dL (ref 6–20)
CALCIUM: 9 mg/dL (ref 8.9–10.3)
CHLORIDE: 92 mmol/L — AB (ref 101–111)
CO2: 36 mmol/L — AB (ref 22–32)
Creatinine, Ser: 0.46 mg/dL (ref 0.44–1.00)
GFR calc non Af Amer: >60 mL/min (ref >60)
Glucose, Bld: 233 mg/dL — ABNORMAL HIGH (ref 65–99)
Potassium: 4.1 mmol/L (ref 3.5–5.1)
SODIUM: 138 mmol/L (ref 135–145)
Total Bilirubin: 0.5 mg/dL (ref 0.3–1.2)
Total Protein: 6.8 g/dL (ref 6.5–8.1)

## 2017-02-01 LAB — BLOOD GAS, ARTERIAL
Acid-Base Excess: 9.8 mmol/L — ABNORMAL HIGH (ref 0.0–2.0)
Bicarbonate: 40.1 mmol/L — ABNORMAL HIGH (ref 20.0–28.0)
Drawn by: 275531
O2 CONTENT: 15 L/min
O2 Saturation: 93.4 %
PATIENT TEMPERATURE: 98.6
PCO2 ART: 87.5 mmHg — AB (ref 32.0–48.0)
pH, Arterial: 7.283 — ABNORMAL LOW (ref 7.350–7.450)
pO2, Arterial: 70.3 mmHg — ABNORMAL LOW (ref 83.0–108.0)

## 2017-02-01 LAB — CBC
HCT: 43.1 % (ref 36.0–46.0)
Hemoglobin: 13.4 g/dL (ref 12.0–15.0)
MCH: 31.8 pg (ref 26.0–34.0)
MCHC: 31.1 g/dL (ref 30.0–36.0)
MCV: 102.4 fL — AB (ref 78.0–100.0)
Platelets: 95 10*3/uL — ABNORMAL LOW (ref 150–400)
RBC: 4.21 MIL/uL (ref 3.87–5.11)
RDW: 13.9 % (ref 11.5–15.5)
WBC: 9.9 10*3/uL (ref 4.0–10.5)

## 2017-02-01 LAB — ECHOCARDIOGRAM COMPLETE
Height: 62 in
WEIGHTICAEL: 3206.37 [oz_av]

## 2017-02-01 LAB — TSH: TSH: 0.561 u[IU]/mL (ref 0.350–4.500)

## 2017-02-01 LAB — TROPONIN I: Troponin I: <0.03 ng/mL (ref <0.03)

## 2017-02-01 LAB — GLUCOSE, CAPILLARY
GLUCOSE-CAPILLARY: 355 mg/dL — AB (ref 65–99)
Glucose-Capillary: 264 mg/dL — ABNORMAL HIGH (ref 65–99)
Glucose-Capillary: 299 mg/dL — ABNORMAL HIGH (ref 65–99)
Glucose-Capillary: 305 mg/dL — ABNORMAL HIGH (ref 65–99)

## 2017-02-01 LAB — PROTIME-INR
INR: 1.02
PROTHROMBIN TIME: 13.4 s (ref 11.4–15.2)

## 2017-02-01 LAB — PHOSPHORUS: Phosphorus: 2.6 mg/dL (ref 2.5–4.6)

## 2017-02-01 LAB — MAGNESIUM: MAGNESIUM: 2 mg/dL (ref 1.7–2.4)

## 2017-02-01 MED ORDER — GUAIFENESIN ER 600 MG PO TB12
1200.0000 mg | ORAL_TABLET | Freq: Two times a day (BID) | ORAL | Status: DC
  Administered 2017-02-01 – 2017-02-04 (×6): 1200 mg via ORAL
  Filled 2017-02-01 (×7): qty 2

## 2017-02-01 MED ORDER — SODIUM CHLORIDE 3 % IN NEBU
4.0000 mL | INHALATION_SOLUTION | Freq: Two times a day (BID) | RESPIRATORY_TRACT | Status: DC
  Administered 2017-02-01 – 2017-02-03 (×5): 4 mL via RESPIRATORY_TRACT
  Filled 2017-02-01 (×6): qty 4

## 2017-02-01 MED ORDER — BUDESONIDE 0.5 MG/2ML IN SUSP
0.5000 mg | Freq: Two times a day (BID) | RESPIRATORY_TRACT | Status: DC
  Administered 2017-02-01 – 2017-02-04 (×7): 0.5 mg via RESPIRATORY_TRACT
  Filled 2017-02-01 (×7): qty 2

## 2017-02-01 MED ORDER — TORSEMIDE 20 MG PO TABS
20.0000 mg | ORAL_TABLET | Freq: Every day | ORAL | Status: DC
  Administered 2017-02-01 – 2017-02-04 (×4): 20 mg via ORAL
  Filled 2017-02-01 (×4): qty 1

## 2017-02-01 MED ORDER — ARFORMOTEROL TARTRATE 15 MCG/2ML IN NEBU
15.0000 ug | INHALATION_SOLUTION | Freq: Two times a day (BID) | RESPIRATORY_TRACT | Status: DC
  Administered 2017-02-01 – 2017-02-04 (×7): 15 ug via RESPIRATORY_TRACT
  Filled 2017-02-01 (×7): qty 2

## 2017-02-01 NOTE — Progress Notes (Signed)
Speech Pathology:  Orders received for MBS to determine potential for aspiration.  Pt scheduled for study next date, 02/02/17.  Kaylinn Dedic L. Samson Fredericouture, [place] CCC/SLP Pager [phone]

## 2017-02-01 NOTE — Progress Notes (Signed)
  Echocardiogram 2D Echocardiogram has been performed.  [name], [name] 02/01/2017, 3:16 PM

## 2017-02-01 NOTE — Progress Notes (Signed)
Nutrition Brief Note  Patient identified per COPD protocol.  Wt Readings from Last 15 Encounters:  02/01/17 200 lb 6.4 oz (90.9 kg)  01/15/17 201 lb 6.4 oz (91.4 kg)  11/09/16 201 lb 6.4 oz (91.4 kg)  09/07/16 196 lb (88.9 kg)  08/28/16 198 lb 3.2 oz (89.9 kg)  08/13/16 197 lb 8.5 oz (89.6 kg)  07/29/16 199 lb (90.3 kg)  07/27/16 198 lb (89.8 kg)  07/22/16 199 lb 6.4 oz (90.4 kg)  05/31/16 193 lb (87.5 kg)  04/30/16 194 lb (88 kg)  04/20/16 196 lb (88.9 kg)  04/11/16 190 lb 0.6 oz (86.2 kg)  01/22/16 202 lb 8 oz (91.9 kg)  01/21/16 200 lb 3.2 oz (90.8 kg)    Body mass index is 36.65 kg/m. Patient meets criteria for obesity based on current BMI.   Current diet order is Carb Modified and pt ate 75% of breakfast this AM which consisted of [place] toast, scrambled eggs, 2 pieces of [place] sausage, breakfast potatoes, 2 fruit cups, blueberry muffin, and a cup of coffee.   Labs and medications reviewed.   No nutrition interventions warranted at this time. If nutrition issues arise, please consult RD.     Trenton GammonJessica Jia Dottavio, MS, RD, LDN, [facility] Inpatient Clinical Dietitian Pager # [phone] After hours/weekend pager # [phone]

## 2017-02-01 NOTE — Progress Notes (Addendum)
Date:  February 01, 2017 Chart reviewed for concurrent status and case management needs. Home Heart Failure Screening forwarded to [facility] patient already this agency services in place./ Per [facility] is not an active patient and [facility].  Patient is interested in palliative in the home/list reviewed with patient and would like to use [facility]/tct-Stacey Stine with [facility] to come and speak with patient concerning home options/ Marcelle SmilingRhonda Taeshawn Helfman, BSN, [place]RN3, [place]CCM   [phone]

## 2017-02-01 NOTE — Progress Notes (Signed)
[facility]:  Received request from PendergrassRhonda, [facility] InstituteCMRN to speak to patient re: palliative services with [facility].  Discussed palliative services with patient and answered questions.  Patient would like the added support in the home environment to assist with symptom management.  Patient will be seeing primary care provider after hospital discharge and plans on discussing to obtain an order.  Left contact information if any needs arise.  Please contact with any questions.  Thank you, Hessie KnowsStacie Wilkinson RN, BSN [facility] Liaison [phone]

## 2017-02-01 NOTE — Progress Notes (Addendum)
PROGRESS NOTE    JAIA ALONGE   ZOX:[id number]  DOB: 09/08/1976  DOA: 01/31/2017 PCP: Jeanine Luz, FNP   Brief Narrative:  Mary [name] is a 41 y.o. female with medical history significant of COPD with oxygen dependency of 3-1/2 L at baseline, DM2, Diastolic CHF, DVT, alcoholism, migraines, pulmonary hypertension, pulmonary embolus, CHF & fatty liver disease.    Presented with worsening shortness of breath at baseline COPD on 3.5 L. States she just finished her prednisone taper the day before after her recent discharge. Reports sudden shortness of breath. She checked her Pulse ox and it was 45%.   She called EMS Patient was given 10 mg of albuterol and 0.5 Atrovent 125 Solu-Medrol and 2 g of magnesium oxygen had to be increased up to 4 L and then required nonrebreather.  Subjective: Feels much better than when she came in. Coughing but not bringing up any mucous.   Assessment & Plan:   Active Problems:  Acute on chronic respiratory failure with hypoxia,  COPD exacerbation  H/o severe COPD with CO2 retention and Cor pulmonale - multiple admits for the same, well known to pulmonary service. Has been recommended to talk to palliative care and declined it in the past- [facility] has seen her today and she is accepting their services- will order today - pulmonary assisting with management - appreciate this.       OSA treated with BiPAP - cont at bedtime    Diabetes mellitus type 2 in obese  - cont SSI as she is on steroids - A1c 5.7 on 2/18- does not take meds at home    Acute on chronic diastolic CHF - received IV Lasix - takes Demadex at home- will resume     Alcohol abuse - no recent use per patient    Thrombocytopenia - acute, follow   DVT prophylaxis:  Lovenox Code Status: Full code Family Communication:  Disposition Plan: home when stable Consultants:    PCCM Procedures:    Antimicrobials:  Anti-infectives    Start     Dose/Rate Route  Frequency Ordered Stop   01/31/17 2200  doxycycline (VIBRA-TABS) tablet 100 mg     100 mg Oral Every 12 hours 01/31/17 2036         Objective: Vitals:   02/01/17 1100 02/01/17 1108 02/01/17 1200 02/01/17 1300  BP: (!) 138/92     Pulse: 87  88 (!) 101  Resp:   18   Temp:   98.3 F (36.8 C)   TempSrc:      SpO2: 97% 97% (!) 88% 92%  Weight:      Height:        Intake/Output Summary (Last 24 hours) at 02/01/17 1324 Last data filed at 02/01/17 1200  Gross per 24 hour  Intake              725 ml  Output             3250 ml  Net            -2525 ml   Filed Weights   01/31/17 2030 02/01/17 0430  Weight: 92.7 kg (204 lb 5.9 oz) 90.9 kg (200 lb 6.4 oz)    Examination: General exam: Appears comfortable  HEENT: PERRLA, oral mucosa moist, no sclera icterus or thrush Respiratory system: Clear to auscultation. Respiratory effort normal. 92% on 3.5 L at this time Cardiovascular system: S1 & S2 heard, RRR.  No murmurs  Gastrointestinal system: Abdomen soft,  non-tender, nondistended. Normal bowel sound. No organomegaly Central nervous system: Alert and oriented. No focal neurological deficits. Extremities: No cyanosis, clubbing or edema Skin: No rashes or ulcers Psychiatry:  Mood & affect appropriate.     Data Reviewed: I have personally reviewed following labs and imaging studies  CBC:  Recent Labs Lab 01/31/17 1727 02/01/17 0322  WBC 8.6 9.9  NEUTROABS 6.7  --   HGB 13.7 13.4  HCT 43.9 43.1  MCV 103.3* 102.4*  PLT 97* 95*   Basic Metabolic Panel:  Recent Labs Lab 01/31/17 1727 02/01/17 0322  NA 142 138  K 3.6 4.1  CL 95* 92*  CO2 38* 36*  GLUCOSE 212* 233*  BUN 13 9  CREATININE 0.53 0.46  CALCIUM 8.7* 9.0  MG  --  2.0  PHOS  --  2.6   GFR: Estimated Creatinine Clearance: 99 mL/min (by C-G formula based on SCr of 0.46 mg/dL). Liver Function Tests:  Recent Labs Lab 01/31/17 1727 02/01/17 0322  AST 44* 32  ALT 50 49  ALKPHOS 107 90  BILITOT 0.7  0.5  PROT 6.9 6.8  ALBUMIN 4.1 3.9   No results for input(s): LIPASE, AMYLASE in the last 168 hours. No results for input(s): AMMONIA in the last 168 hours. Coagulation Profile:  Recent Labs Lab 02/01/17 0322  INR 1.02   Cardiac Enzymes:  Recent Labs Lab 01/31/17 1727 01/31/17 2036 02/01/17 0107 02/01/17 0648  TROPONINI <0.03 <0.03 <0.03 <0.03   BNP (last 3 results)  Recent Labs  04/20/16 1703  PROBNP 30.0   HbA1C: No results for input(s): HGBA1C in the last 72 hours. CBG:  Recent Labs Lab 01/31/17 2108 02/01/17 0749 02/01/17 1216  GLUCAP 278* 355* 264*   Lipid Profile: No results for input(s): CHOL, HDL, LDLCALC, TRIG, CHOLHDL, LDLDIRECT in the last 72 hours. Thyroid Function Tests:  Recent Labs  02/01/17 0322  TSH 0.561   Anemia Panel: No results for input(s): VITAMINB12, FOLATE, FERRITIN, TIBC, IRON, RETICCTPCT in the last 72 hours. Urine analysis:    Component Value Date/Time   COLORURINE STRAW (A) 01/31/2017 2144   APPEARANCEUR CLEAR 01/31/2017 2144   LABSPEC 1.015 01/31/2017 2144   PHURINE 5.0 01/31/2017 2144   GLUCOSEU >=500 (A) 01/31/2017 2144   HGBUR NEGATIVE 01/31/2017 2144   BILIRUBINUR NEGATIVE 01/31/2017 2144   KETONESUR NEGATIVE 01/31/2017 2144   PROTEINUR NEGATIVE 01/31/2017 2144   UROBILINOGEN 1.0 07/25/2015 1400   NITRITE NEGATIVE 01/31/2017 2144   LEUKOCYTESUR NEGATIVE 01/31/2017 2144   Sepsis Labs: @LABRCNTIP (procalcitonin:4,lacticidven:4) )No results found for this or any previous visit (from the past 240 hour(s)).       Radiology Studies: Dg Chest Port 1 View  Result Date: 01/31/2017 CLINICAL DATA:  Shortness of breath. EXAM: PORTABLE CHEST 1 VIEW COMPARISON:  01/11/2017.  08/25/2016.  07/18/2016.  11/13/2015. FINDINGS: Mediastinum and hilar structures are stable . Stable cardiomegaly. Mild basilar interstitial prominence. Small right pleural effusion. Left costophrenic angle not imaged. Mild CHF cannot be excluded.  Left base pleural-parenchymal thickening again noted consistent scarring. IMPRESSION: 1. Cardiomegaly with mild basilar interstitial prominence of small right pleural effusion. Mild CHF cannot be excluded. 2. Left base pleuroparenchymal thickening again noted consistent with scarring. Electronically Signed   By: Maisie Fus  Register   On: 01/31/2017 17:31      Scheduled Meds: . arformoterol  15 mcg Nebulization BID  . budesonide (PULMICORT) nebulizer solution  0.5 mg Nebulization BID  . chlorhexidine  15 mL Mouth Rinse BID  . doxycycline  100  mg Oral Q12H  . enoxaparin (LOVENOX) injection  40 mg Subcutaneous Q24H  . gabapentin  600 mg Oral TID  . guaiFENesin  1,200 mg Oral BID  . insulin aspart  0-20 Units Subcutaneous TID WC  . insulin aspart  0-5 Units Subcutaneous QHS  . ipratropium-albuterol  3 mL Nebulization QID  . mouth rinse  15 mL Mouth Rinse q12n4p  . pantoprazole  40 mg Oral BID AC  . potassium chloride SA  20 mEq Oral Daily  . predniSONE  40 mg Oral Q breakfast  . roflumilast  500 mcg Oral Daily  . senna  1 tablet Oral BID  . sodium chloride flush  3 mL Intravenous Q12H  . sodium chloride flush  3 mL Intravenous Q12H  . sodium chloride HYPERTONIC  4 mL Nebulization BID   Continuous Infusions:   LOS: 1 day    Time spent in minutes: 35    Reiner Loewen, MD [facility] Pager: [email or web address] Password [email or web address] 02/01/2017, 1:24 PM

## 2017-02-01 NOTE — Progress Notes (Signed)
Pt seen x2 to assist with nocturnal bipap, but pt declined.  Pt stated she has been placing it on/off herself throughout the day and night.  This RT will continue to follow and assist with bipap when pt is ready for bed.

## 2017-02-01 NOTE — Care Management Note (Addendum)
Case Management Note  Patient Details  Name: Jana HakimMichelle L Asbill MRN: [id number] Date of Birth: 10/07/1976  Subjective/Objective:               shortness of breath in copd patient and failure for outpatient treatment to control symptoms. List of hospice providers given to patient.  Good rx cardgiven for help with medications.  Patient has chosen [facility] message left voice mail that patient would like to speak to a representative about home palliative care.  Action/Plan: Date:  February 01, 2017 Chart reviewed for concurrent status and case management needs. Will continue to follow patient progress. Discharge Planning: following for needs Expected discharge date: [id number] Marcelle SmilingRhonda Davis, BSN, [place]3, [place]M   [phone]  Expected Discharge Date:   (unknown)               Expected Discharge Plan:  Home w Home Health Services  In-House Referral:     Discharge planning Services  CM Consult  Post Acute Care Choice:    Choice offered to:  Patient  DME Arranged:    DME Agency:     HH Arranged:    HH Agency:     Status of Service:  In process, will continue to follow  If discussed at Long Length of Stay Meetings, dates discussed:    Additional Comments:  Golda AcreDavis, Rhonda Lynn, RN 02/01/2017, 11:52 AM

## 2017-02-01 NOTE — Evaluation (Signed)
Physical Therapy Evaluation Patient Details Name: Mary [name] MRN: [id number] DOB: 06-14-1976 Today's Date: 02/01/2017   History of Present Illness  Mary [name] is a 41 y.o. female with medical history significant of COPD with oxygen dependency of 3-1/2 L at baseline, DM2, Diastolic CHF, DVT, alcoholism, migraines, pulmonary hypertension, pulmonary embolus, CHF, fatty liver disease.  Clinical Impression  Pt admitted with above diagnosis. Pt currently with functional limitations due to the deficits listed below (see PT Problem List).  Pt will benefit from skilled PT to increase their independence and safety with mobility to allow discharge to the venue listed below.   decr sats with activity, see below for details, BP 138/92, HR 100s; will continue to follow in acute setting and try to progress activity as much as tolerated given pt pulmonary status;      Follow Up Recommendations No PT follow up    Equipment Recommendations  None recommended by PT    Recommendations for Other Services       Precautions / Restrictions Precautions Precautions: Fall Precaution Comments: monitor O2 Restrictions Weight Bearing Restrictions: No      Mobility  Bed Mobility   Bed Mobility: Supine to Sit     Supine to sit: Supervision     General bed mobility comments: only for multiple lines, no physical assist  Transfers Overall transfer level: Needs assistance   Transfers: Sit to/from Stand;Stand Pivot Transfers Sit to Stand: Supervision Stand pivot transfers: Supervision       General transfer comment: for safety and line management  Ambulation/Gait             General Gait Details: deferred d/t decr sats with transfer--O2 sats 90-91% at baseline, decr to 82% with standing on 4L, returned to >91% with rest  Stairs            Wheelchair Mobility    Modified Rankin (Stroke Patients Only)       Balance Overall balance assessment: Needs assistance    Sitting balance-Leahy Scale: Good Sitting balance - Comments: pt sits EOB and wt shifts to wash feet (in process upon PT arrival)   Standing balance support: No upper extremity supported;During functional activity Standing balance-Leahy Scale: Good Standing balance comment: wt shifts well, fatigues quickly/ltd standing tolerance             High level balance activites: Turns;Head turns;Backward walking;Side stepping High Level Balance Comments: maneuvering bed to chair, pt assisting with line untangling and management             Pertinent Vitals/Pain Pain Assessment: No/denies pain    Home Living Family/patient expects to be discharged to:: Private residence Living Arrangements: Spouse/significant other Available Help at Discharge: Family;Available PRN/intermittently Type of Home: House Home Access: Stairs to enter Entrance Stairs-Rails: Right Entrance Stairs-Number of Steps: 3 Home Layout: One level 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;Wheelchair - manual Additional Comments: w/c doesn't have foot rests; uses motorized scooter at [employer]    Prior Function Level of Independence: Independent;Independent with assistive device(s)         Comments: Independent household ambulator; uses scooter when going out      [employer]        Extremity/Trunk Assessment   Upper Extremity Assessment Upper Extremity Assessment: Overall WFL for tasks assessed;Defer to OT evaluation    Lower Extremity Assessment Lower Extremity Assessment: Overall WFL for tasks assessed    Cervical / Trunk Assessment Cervical / Trunk Assessment: Kyphotic  Communication   Communication: No difficulties  Cognition Arousal/Alertness: Awake/alert Behavior During Therapy: WFL for tasks assessed/performed Overall Cognitive Status: Within Functional Limits for tasks assessed                      General Comments      Exercises     Assessment/Plan    PT Assessment  Patient needs continued PT services  PT Problem List Decreased activity tolerance;Decreased mobility;Cardiopulmonary status limiting activity;Obesity       PT Treatment Interventions Gait training;Therapeutic activities;Therapeutic exercise;Functional mobility training;Patient/family education    PT Goals (Current goals can be found in the Care Plan section)  Acute Rehab PT Goals Patient Stated Goal: to stay around a little longer PT Goal Formulation: With patient Time For Goal Achievement: 02/15/17 Potential to Achieve Goals: Good    Frequency Min 3X/week   Barriers to discharge        Co-evaluation               End of Session   Activity Tolerance: Patient tolerated treatment well;Treatment limited secondary to medical complications (Comment) (decr sats) Patient left: in chair;with call bell/phone within reach;Other (comment) (Bipap within reach)   PT Visit Diagnosis: Difficulty in walking, not elsewhere classified (R26.2)         Time: [id number] PT Time Calculation (min) (ACUTE ONLY): 32 min   Charges:   PT Evaluation $PT Eval Moderate Complexity: 1 Procedure     PT G Codes:         Mary [name] 02/01/2017, 11:55 AM
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
## 2017-02-02 ENCOUNTER — Inpatient Hospital Stay (HOSPITAL_COMMUNITY): Payer: 59

## 2017-02-02 DIAGNOSIS — I5033 Acute on chronic diastolic (congestive) heart failure: Secondary | ICD-10-CM

## 2017-02-02 DIAGNOSIS — J9622 Acute and chronic respiratory failure with hypercapnia: Secondary | ICD-10-CM

## 2017-02-02 LAB — BASIC METABOLIC PANEL
Anion gap: 10 (ref 5–15)
BUN: 26 mg/dL — AB (ref 6–20)
CALCIUM: 9.3 mg/dL (ref 8.9–10.3)
CO2: 42 mmol/L — ABNORMAL HIGH (ref 22–32)
CREATININE: 0.63 mg/dL (ref 0.44–1.00)
Chloride: 86 mmol/L — ABNORMAL LOW (ref 101–111)
GFR calc Af Amer: >60 mL/min (ref >60)
Glucose, Bld: 193 mg/dL — ABNORMAL HIGH (ref 65–99)
Potassium: 3.8 mmol/L (ref 3.5–5.1)
SODIUM: 138 mmol/L (ref 135–145)

## 2017-02-02 LAB — BLOOD GAS, ARTERIAL
Acid-Base Excess: 11 mmol/L — ABNORMAL HIGH (ref 0.0–2.0)
BICARBONATE: 41.3 mmol/L — AB (ref 20.0–28.0)
Drawn by: 270211
O2 Content: 4 L/min
O2 Saturation: 86.5 %
PATIENT TEMPERATURE: 98.6
PH ART: 7.297 — AB (ref 7.350–7.450)
PO2 ART: 55.2 mmHg — AB (ref 83.0–108.0)
pCO2 arterial: 87.2 mmHg (ref 32.0–48.0)

## 2017-02-02 LAB — GLUCOSE, CAPILLARY
GLUCOSE-CAPILLARY: 196 mg/dL — AB (ref 65–99)
GLUCOSE-CAPILLARY: 376 mg/dL — AB (ref 65–99)
Glucose-Capillary: 231 mg/dL — ABNORMAL HIGH (ref 65–99)
Glucose-Capillary: 202 mg/dL — ABNORMAL HIGH (ref 65–99)

## 2017-02-02 LAB — CBC
HCT: 44.2 % (ref 36.0–46.0)
Hemoglobin: 13.7 g/dL (ref 12.0–15.0)
MCH: 32.3 pg (ref 26.0–34.0)
MCHC: 31 g/dL (ref 30.0–36.0)
MCV: 104.2 fL — ABNORMAL HIGH (ref 78.0–100.0)
PLATELETS: 118 10*3/uL — AB (ref 150–400)
RBC: 4.24 MIL/uL (ref 3.87–5.11)
RDW: 13.9 % (ref 11.5–15.5)
WBC: 16.4 10*3/uL — ABNORMAL HIGH (ref 4.0–10.5)

## 2017-02-02 LAB — HEMOGLOBIN A1C
Hgb A1c MFr Bld: 6.3 % — ABNORMAL HIGH (ref 4.8–5.6)
Mean Plasma Glucose: 134 mg/dL

## 2017-02-02 LAB — HIV ANTIBODY (ROUTINE TESTING W REFLEX): HIV Screen 4th Generation wRfx: NONREACTIVE

## 2017-02-02 MED ORDER — POLYVINYL ALCOHOL 1.4 % OP SOLN
1.0000 [drp] | OPHTHALMIC | Status: DC | PRN
  Filled 2017-02-02: qty 15

## 2017-02-02 MED ORDER — INSULIN ASPART 100 UNIT/ML ~~LOC~~ SOLN
3.0000 [IU] | Freq: Three times a day (TID) | SUBCUTANEOUS | Status: DC
  Administered 2017-02-02 – 2017-02-04 (×6): 3 [IU] via SUBCUTANEOUS

## 2017-02-02 NOTE — Progress Notes (Deleted)
Pt not being followed by nephrologist. Hospitalist is okay with pt receiving PICC with BUN < 30.

## 2017-02-02 NOTE — Progress Notes (Signed)
PROGRESS NOTE    Mary [name]   JXB:[id number]  DOB: 01-12-1976  DOA: 01/31/2017 PCP: Jeanine Luz, FNP   Brief Narrative:  Mary [name] is a 42 y.o. female with medical history significant of COPD with oxygen dependency of 3-1/2 L at baseline, DM2, Diastolic CHF, DVT, alcoholism, migraines, pulmonary hypertension, pulmonary embolus, CHF & fatty liver disease.    Presented with worsening shortness of breath at baseline COPD on 3.5 L. States she just finished her prednisone taper the day before after her recent discharge. Reports sudden shortness of breath. She checked her Pulse ox and it was 45%.   She called EMS Patient was given 10 mg of albuterol and 0.5 Atrovent 125 Solu-Medrol and 2 g of magnesium oxygen had to be increased up to 4 L and then required nonrebreather.  Subjective: Feels much better than when she came in.  Wanting to walk today.   Assessment & Plan:   Active Problems:  Acute on chronic respiratory failure with hypoxia,  COPD exacerbation  H/o severe COPD with CO2 retention and Cor pulmonale - multiple admits for the same, well known to pulmonary service. Has been recommended to talk to palliative care and declined it in the past- [facility] has seen her and she is accepting their services  - pulmonary assisting with management - appreciate this.  - ambulated today - pulse ox 86% on 4 L on exertion     OSA treated with BiPAP - cont at bedtime    Diabetes mellitus type 2 in obese  - cont SSI as she is on steroids - A1c 5.7 on 2/18- does not take meds at home - sugars now up to 300s despite resistant SSI and carb mod diet- add 3 U with meals    Acute on chronic diastolic CHF - received IV Lasix - takes Demadex at home- will resume     Alcohol abuse - no recent use per patient    Thrombocytopenia - acute, follow   DVT prophylaxis:  Lovenox Code Status: Full code Family Communication:  Disposition Plan: home when stable Consultants:     PCCM Procedures:    Antimicrobials:  Anti-infectives    Start     Dose/Rate Route Frequency Ordered Stop   01/31/17 2200  doxycycline (VIBRA-TABS) tablet 100 mg     100 mg Oral Every 12 hours 01/31/17 2036         Objective: Vitals:   02/02/17 1204 02/02/17 1445 02/02/17 1457 02/02/17 1535  BP: 111/76  109/75   Pulse: 89  79   Resp: 16  20   Temp: 98.5 F (36.9 C)  97.8 F (36.6 C)   TempSrc: Oral  Oral   SpO2: 91% (!) 86% 93% 91%  Weight: 93.9 kg (207 lb 0.2 oz)     Height: 5\' 2"  (1.575 m)       Intake/Output Summary (Last 24 hours) at 02/02/17 1732 Last data filed at 02/02/17 1500  Gross per 24 hour  Intake              700 ml  Output             1200 ml  Net             -500 ml   Filed Weights   02/01/17 0430 02/02/17 0314 02/02/17 1204  Weight: 90.9 kg (200 lb 6.4 oz) 94 kg (207 lb 3.7 oz) 93.9 kg (207 lb 0.2 oz)    Examination: General exam: Appears  comfortable  HEENT: PERRLA, oral mucosa moist, no sclera icterus or thrush Respiratory system: Clear to auscultation. Respiratory effort normal. 92% on 3.5 L at this time Cardiovascular system: S1 & S2 heard, RRR.  No murmurs  Gastrointestinal system: Abdomen soft, non-tender, nondistended. Normal bowel sound. No organomegaly Central nervous system: Alert and oriented. No focal neurological deficits. Extremities: No cyanosis, clubbing or edema Skin: No rashes or ulcers Psychiatry:  Mood & affect appropriate.     Data Reviewed: I have personally reviewed following labs and imaging studies  CBC:  Recent Labs Lab 01/31/17 1727 02/01/17 0322 02/02/17 0336  WBC 8.6 9.9 16.4*  NEUTROABS 6.7  --   --   HGB 13.7 13.4 13.7  HCT 43.9 43.1 44.2  MCV 103.3* 102.4* 104.2*  PLT 97* 95* 118*   Basic Metabolic Panel:  Recent Labs Lab 01/31/17 1727 02/01/17 0322 02/02/17 0336  NA 142 138 138  K 3.6 4.1 3.8  CL 95* 92* 86*  CO2 38* 36* 42*  GLUCOSE 212* 233* 193*  BUN 13 9 26*  CREATININE 0.53 0.46 0.63   CALCIUM 8.7* 9.0 9.3  MG  --  2.0  --   PHOS  --  2.6  --    GFR: Estimated Creatinine Clearance: 100.8 mL/min (by C-G formula based on SCr of 0.63 mg/dL). Liver Function Tests:  Recent Labs Lab 01/31/17 1727 02/01/17 0322  AST 44* 32  ALT 50 49  ALKPHOS 107 90  BILITOT 0.7 0.5  PROT 6.9 6.8  ALBUMIN 4.1 3.9   No results for input(s): LIPASE, AMYLASE in the last 168 hours. No results for input(s): AMMONIA in the last 168 hours. Coagulation Profile:  Recent Labs Lab 02/01/17 0322  INR 1.02   Cardiac Enzymes:  Recent Labs Lab 01/31/17 1727 01/31/17 2036 02/01/17 0107 02/01/17 0648  TROPONINI <0.03 <0.03 <0.03 <0.03   BNP (last 3 results)  Recent Labs  04/20/16 1703  PROBNP 30.0   HbA1C:  Recent Labs  02/01/17 0322  HGBA1C 6.3*   CBG:  Recent Labs Lab 02/01/17 1552 02/01/17 2124 02/02/17 0826 02/02/17 1233 02/02/17 1644  GLUCAP 299* 305* 196* 231* 376*   Lipid Profile: No results for input(s): CHOL, HDL, LDLCALC, TRIG, CHOLHDL, LDLDIRECT in the last 72 hours. Thyroid Function Tests:  Recent Labs  02/01/17 0322  TSH 0.561   Anemia Panel: No results for input(s): VITAMINB12, FOLATE, FERRITIN, TIBC, IRON, RETICCTPCT in the last 72 hours. Urine analysis:    Component Value Date/Time   COLORURINE STRAW (A) 01/31/2017 2144   APPEARANCEUR CLEAR 01/31/2017 2144   LABSPEC 1.015 01/31/2017 2144   PHURINE 5.0 01/31/2017 2144   GLUCOSEU >=500 (A) 01/31/2017 2144   HGBUR NEGATIVE 01/31/2017 2144   BILIRUBINUR NEGATIVE 01/31/2017 2144   KETONESUR NEGATIVE 01/31/2017 2144   PROTEINUR NEGATIVE 01/31/2017 2144   UROBILINOGEN 1.0 07/25/2015 1400   NITRITE NEGATIVE 01/31/2017 2144   LEUKOCYTESUR NEGATIVE 01/31/2017 2144   Sepsis Labs: @LABRCNTIP (procalcitonin:4,lacticidven:4) )No results found for this or any previous visit (from the past 240 hour(s)).       Radiology Studies: Ct Chest High Resolution  Result Date: 02/02/2017 CLINICAL  DATA:  COPD, oxygen dependency, recent prednisone therapy, 7 shortness of breath. EXAM: CT CHEST WITHOUT CONTRAST TECHNIQUE: Multidetector CT imaging of the chest was performed following the standard protocol without intravenous contrast. High resolution imaging of the lungs, as well as inspiratory and expiratory imaging, was performed. COMPARISON:  CT chest exams dating back 12/27/2013. FINDINGS: Cardiovascular: Pulmonary arteries and  heart are enlarged. No pericardial effusion. Mediastinum/Nodes: No pathologically enlarged mediastinal or axillary lymph nodes. Hilar regions are difficult to evaluate without IV contrast but appear grossly unremarkable. Esophagus is grossly unremarkable. Lungs/Pleura: Mosaic pulmonary parenchymal attenuation with scattered pulmonary parenchymal scarring and mild architectural distortion. No subpleural reticulation, traction bronchiectasis/bronchiolectasis, ground-glass or honeycombing. Focal area of ground-glass in the posterior segment right upper lobe measures 2.1 cm, stable from 01/13/2015 but minimally enlarged from 12/27/2013, at which time it measured approximately 1.7 cm. No solid components. No pleural fluid. Airway is unremarkable. Upper Abdomen: Liver appears enlarged enlarged and diffusely decreased in attenuation but is incompletely imaged. Visualized portions of the gallbladder, adrenal glands, right kidney unremarkable. Punctate stone left kidney. Visualized portions of the spleen, pancreas, stomach and bowel are grossly unremarkable. No upper abdominal adenopathy. Musculoskeletal: No worrisome lytic or sclerotic lesions. IMPRESSION: 1. Scattered pulmonary parenchymal scarring with mosaic pulmonary parenchymal attenuation and mild architectural distortion, suggestive of obliterative bronchiolitis. 2. No evidence of fibrotic interstitial lung disease. 3. Enlarged pulmonary arteries, indicative of pulmonary arterial hypertension. 4. Right upper lobe ground-glass nodule,  stable from 01/18/2015 but minimally slightly enlarged from 12/27/2013. Additional follow-up could be obtained in 2 years to ensure continued stability. This recommendation follows the consensus statement: Guidelines for Management of Small Pulmonary Nodules Detected on CT Images: From the Fleischner Society 2017; Radiology 2017; 284:228-243. 5. Fatty enlarged liver. 6. Left renal stone. Electronically Signed   By: Leanna Battles M.D.   On: 02/02/2017 08:21   Dg Swallowing Func-speech Pathology  Result Date: 02/02/2017 Objective Swallowing Evaluation: Type of Study: MBS-Modified Barium Swallow Study Patient Details Name: Mary [name] MRN: [id number] Date of Birth: 12-18-1975 Today's Date: 02/02/2017 Time: SLP Start Time (ACUTE ONLY): 1135-SLP Stop Time (ACUTE ONLY): 1155 SLP Time Calculation (min) (ACUTE ONLY): 20 min Past Medical History: Past Medical History: Diagnosis Date . Axillary adenopathy   right axillary adenopathy noted on CT chest (03/06/2012) . Chronic right-sided CHF (congestive heart failure) 03/23/2012  with cor pulmonale. Last RHC 03/2012 . COPD (chronic obstructive pulmonary disease) (HCC)   PFTS 03/08/12: fev1 0.58L/1%, FVC 1.18/33%, Ratop 49 and c/w ssevere obstruction. 21% BD response on FVC, RV 219%, DLCO 11/54% . DVT of upper extremity (deep vein thrombosis) [facility]) April 2013  right subclavian // Unclear precipitating cause - possibly significant right heart failure, with vascular stasis potentially predisposing to clotting.   . Exertional shortness of breath  . Hepatic cirrhosis (HCC)   Questionable history of - Noted on CT abdomen (03/2012) - thought to be due to vascular congestion from right heart failure +/- patient's history of alcohol abuse . History of alcoholism (HCC)  . History of chronic bronchitis  . Irregular menses  . Migraine   "~ 1/yr" (05/03/2013) . Moderate to severe pulmonary hypertension April 2013  Cardiac cath on 03/06/12 - 1. Elevated pulmonary artery pressures, right  sided filling pressures.,  2. PA: 64/45 (mean 53)   . On home oxygen therapy   "2-3 L 24/7" (05/03/2013) . OSA (obstructive sleep apnea)   wears noctural BiPAP (05/03/2013) . Periodontal disease  . Pneumonia ~ 1985; 01/2011 . Pulmonary embolus [facility]) April 2013  Precipitating cause unclear. Was treated with coumadin from April-June 2013. Past Surgical History: Past Surgical History: Procedure Laterality Date . APPENDECTOMY  05/03/2013 . CARDIAC CATHETERIZATION  03/2012 . CARDIAC CATHETERIZATION N/A 01/08/2016  Procedure: Right Heart Cath;  Surgeon: Dolores Patty, MD;  Location: [facility] INVASIVE CV LAB;  Service: Cardiovascular;  Laterality: N/A; . CARDIAC  SURGERY  10/16/1977  "my heart was backwards" (05/03/2013) . LAPAROSCOPIC APPENDECTOMY N/A 05/03/2013  Procedure: APPENDECTOMY LAPAROSCOPIC;  Surgeon: Cherylynn RidgesJames O Wyatt, MD;  [facility];  Service: General;  Laterality: N/A; . LUNG SURGERY  10/16/1977 . RIGHT HEART CATHETERIZATION N/A 03/07/2012  Procedure: RIGHT HEART CATH;  Surgeon: Kathleene Hazelhristopher D McAlhany, MD;  [facility];  Service: Cardiovascular;  Laterality: N/A; HPI: 41 year old female admitted 01/31/17 with acute on chronic respiratory failure with hypoxia. PMH significant for COPD, ETOH abuse, DVT, PE, HTN, chronic bronchitis, PNA, OSA, migraines, CHF, SOB.  Subjective: Pt seen in radiology for MBS to objectively assess swallow function and safety Assessment / Plan / Recommendation CHL IP CLINICAL IMPRESSIONS 02/02/2017 Clinical Impression Pt presents with normal oral, mild pharyngeal dysphagia, as well as some degree of esophageal dysmotility. Pharyngeal swallow is characterized by delayed swallow reflex on puree and solid consistencies. No penetration, aspiration, post-swallow residue, or backflow to the pharynx was noted on any consistency. The barium tablet was given with a sip of water, and while it cleared the oropharynx without difficulty or delay, esophageal sweep revealed it paused in the mid-thoracic  region. Several minutes and multiple boluses of liquid were given to assist in clearing the pill. Recommend continuing with regular diet and thin liquids, following precautions for management of esophageal dysmotility (which were provided and reviewed with pt). Also recommend taking meds one at a time, and following large pills with additional boluses of water and/or allowing extra time between pills.  SLP Visit Diagnosis Dysphagia, pharyngoesophageal phase (R13.14) Impact on safety and function Mild aspiration risk   CHL IP TREATMENT RECOMMENDATION 02/02/2017 Treatment Recommendations No treatment recommended at this time. Education completed in radiology immediately following this study.   Prognosis 02/02/2017 Prognosis for Safe Diet Advancement Good CHL IP DIET RECOMMENDATION 02/02/2017 SLP Diet Recommendations Regular solids;Thin liquid Liquid Administration via Cup;Straw Medication Administration Whole meds with puree one at a time, following large pills with several boluses of water. Compensations Minimize environmental distractions;Slow rate;Small sips/bites;Follow solids with liquid Postural Changes Seated upright at 90 degrees; Remain semi-upright after after feeds/meals    CHL IP OTHER RECOMMENDATIONS 02/02/2017 Oral Care Recommendations Oral care BID    CHL IP ORAL PHASE 02/02/2017 Oral Phase WFL  CHL IP PHARYNGEAL PHASE 02/02/2017 Pharyngeal Phase Impaired Pharyngeal- Thin Cup WFL Pharyngeal- Thin Straw WFL Pharyngeal- Puree Delayed swallow initiation-vallecula Pharyngeal- Regular Delayed swallow initiation-vallecula Pharyngeal- Pill WFL  CHL IP CERVICAL ESOPHAGEAL PHASE 02/02/2017 Cervical Esophageal Phase Impaired Pill Barium tablet cleared the oropharynx without delay or difficulty. Upon esophageal sweep, pill was noted to be stopped at the mid-thoracic region. Several minutes and multiple boluses of thin liquid were required to clear barium tablet. Celia B. ClaytonBueche, [facility] JacksonMSP, CCC-SLP [phone] Leigh AuroraBueche, Celia Brown  02/02/2017, 12:52 PM                 Scheduled Meds: . arformoterol  15 mcg Nebulization BID  . budesonide (PULMICORT) nebulizer solution  0.5 mg Nebulization BID  . chlorhexidine  15 mL Mouth Rinse BID  . doxycycline  100 mg Oral Q12H  . enoxaparin (LOVENOX) injection  40 mg Subcutaneous Q24H  . gabapentin  600 mg Oral TID  . guaiFENesin  1,200 mg Oral BID  . insulin aspart  0-20 Units Subcutaneous TID WC  . insulin aspart  0-5 Units Subcutaneous QHS  . ipratropium-albuterol  3 mL Nebulization QID  . mouth rinse  15 mL Mouth Rinse q12n4p  . pantoprazole  40 mg  Oral BID AC  . potassium chloride SA  20 mEq Oral Daily  . predniSONE  40 mg Oral Q breakfast  . roflumilast  500 mcg Oral Daily  . senna  1 tablet Oral BID  . sodium chloride flush  3 mL Intravenous Q12H  . sodium chloride flush  3 mL Intravenous Q12H  . sodium chloride HYPERTONIC  4 mL Nebulization BID  . torsemide  20 mg Oral Daily   Continuous Infusions:   LOS: 2 days    Time spent in minutes: 35    Gianelle Mccaul, MD [facility] Pager: [email or web address] Password [facility] 02/02/2017, 5:32 PM

## 2017-02-02 NOTE — Progress Notes (Signed)
Pt ambulated in the hallway 300 ft. Pt oxygen saturation dropped to 86% on 4L. While at rest pt saturation was 92% on 4L.  Halea Lieb W Haillie Radu, RN

## 2017-02-02 NOTE — Consult Note (Signed)
Modified Barium Swallow Progress Note  Patient Details  Name: Mary [name] MRN: [id number] Date of Birth: 09/14/1976  Today's Date: 02/02/2017  Modified Barium Swallow completed.  Full report located under Chart Review in the Imaging Section.  Brief recommendations include the following:  Clinical Impression Pt presents with normal oral, mild pharyngeal dysphagia, as well as some degree of esophageal dysmotility. Pharyngeal swallow is characterized by delayed swallow reflex on puree and solid consistencies. No penetration, aspiration, post-swallow residue, or backflow to the pharynx was noted on any consistency. The barium tablet was given with a sip of water, and while it cleared the oropharynx without difficulty or delay, esophageal sweep revealed it paused in the mid-thoracic region. Several minutes and multiple boluses of liquid were given to assist in clearing the pill. Recommend continuing with regular diet and thin liquids, following precautions for management of esophageal dysmotility (which were provided and reviewed with pt). Also recommend taking meds one at a time, and following large pills with additional boluses of water and/or allowing extra time between pills.    Swallow Evaluation Recommendations  SLP Diet Recommendations: Regular solids;Thin liquid   Liquid Administration via: Cup;Straw   Medication Administration: Whole meds with puree   Supervision: Patient able to self feed   Compensations: Minimize environmental distractions;Slow rate;Small sips/bites;Follow solids with liquid   Postural Changes: Remain semi-upright after after feeds/meals (Comment);Seated upright at 90 degrees   Oral Care Recommendations: Oral care BID  Celia B. Murvin NatalBueche, [facility] Surgery CenterMSP, CCC-SLP [phone]  Leigh AuroraBueche, Celia Brown 02/02/2017,1:02 PM

## 2017-02-02 NOTE — Evaluation (Signed)
Occupational Therapy Evaluation Patient Details Name: Mary [name] MRN: [id number] DOB: [birth year] Today's Date: 02/02/2017    History of Present Illness Mary [name] is a 41 y.o. female with medical history significant of COPD with oxygen dependency of 3-1/2 L at baseline, DM2, Diastolic CHF, DVT, alcoholism, migraines, pulmonary hypertension, pulmonary embolus, CHF, fatty liver disease.   Clinical Impression   Pt was admitted for the above. Pt is mod I to independent at baseline and now needs set up for adls and mod I for toileting. Reviewed energy conservation strategies. No further OT needs    Follow Up Recommendations  No OT follow up    Equipment Recommendations  None recommended by OT -- pt does not want tub seat   Recommendations for Other Services       Precautions / Restrictions Precautions Precautions: Fall Precaution Comments: monitor O2 Restrictions Weight Bearing Restrictions: No      Mobility Bed Mobility Overal bed mobility: Modified Independent                Transfers       Sit to Stand: Modified independent (Device/Increase time)              Balance                                            ADL Overall ADL's : Needs assistance/impaired                         Toilet Transfer: Modified Independent;Ambulation;BSC             General ADL Comments: pt is able to perform basic ADLs with set up.  Reviewed energy conservation principles: pt uses a lot of them. She enjoys cooking/baking and sits when she can.  She usually showers when husband is there and now uses 02 in shower.  Discussed seat, which is an out of pocket expense.  Pt's sats are 90% on 4 liters. She states she usually runs 91 on 3.5 liters     Vision         Perception     Praxis      Pertinent Vitals/Pain Pain Assessment: No/denies pain     Hand Dominance     Extremity/Trunk Assessment Upper Extremity  Assessment Upper Extremity Assessment: Overall WFL for tasks assessed           Communication Communication Communication: No difficulties   Cognition Arousal/Alertness: Awake/alert Behavior During Therapy: WFL for tasks assessed/performed Overall Cognitive Status: Within Functional Limits for tasks assessed                     General Comments       Exercises       Shoulder Instructions      Home Living Family/patient expects to be discharged to:: Private residence Living Arrangements: Spouse/significant other Available Help at Discharge: Family;Available PRN/intermittently               Bathroom Shower/Tub: Tub/shower unit Shower/tub characteristics: Curtain Firefighter: Standard     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;Wheelchair - manual          Prior Functioning/Environment Level of Independence: Independent;Independent with assistive device(s)        Comments: home alone during the day        OT Problem List:  OT Treatment/Interventions:      OT Goals(Current goals can be found in the care plan section) Acute Rehab OT Goals Patient Stated Goal: to stay around a little longer OT Goal Formulation: All assessment and education complete, DC therapy  OT Frequency:     Barriers to D/C:            Co-evaluation              End of Session    Activity Tolerance: Patient tolerated treatment well Patient left: in bed;with call bell/phone within reach                   ADL either performed or assessed with clinical judgement  Time: [id number] OT Time Calculation (min): 21 min Charges:  OT General Charges $OT Visit: 1 Procedure OT Evaluation $OT Eval Low Complexity: 1 Procedure G-Codes:     ShoreviewMaryellen Neziah Braley, OTR/L [phone] 02/02/2017  Xavior Niazi 02/02/2017, 4:05 PM

## 2017-02-02 NOTE — Progress Notes (Signed)
Patient taken to swallowing study and then to room 1516.

## 2017-02-02 NOTE — Progress Notes (Signed)
[facility] PCCM Attending Note  Brief: [facility] patient with COPD, sleep apnea, prior congenital heart surgery, born prematurely here with another exacerbation of COPD.  S: Feels better.  Slept well, used BIPAP, cough with congestion, not producing mucus.  Some chest soreness post coughing.  Past Medical History:  Diagnosis Date  . Axillary adenopathy    right axillary adenopathy noted on CT chest (03/06/2012)  . Chronic right-sided CHF (congestive heart failure) 03/23/2012   with cor pulmonale. Last RHC 03/2012  . COPD (chronic obstructive pulmonary disease) (HCC)    PFTS 03/08/12: fev1 0.58L/1%, FVC 1.18/33%, Ratop 49 and c/w ssevere obstruction. 21% BD response on FVC, RV 219%, DLCO 11/54%  . DVT of upper extremity (deep vein thrombosis) [facility](HCC) April 2013   right subclavian // Unclear precipitating cause - possibly significant right heart failure, with vascular stasis potentially predisposing to clotting.    . Exertional shortness of breath   . Hepatic cirrhosis (HCC)    Questionable history of - Noted on CT abdomen (03/2012) - thought to be due to vascular congestion from right heart failure +/- patient's history of alcohol abuse  . History of alcoholism (HCC)   . History of chronic bronchitis   . Irregular menses   . Migraine    "~ 1/yr" (05/03/2013)  . Moderate to severe pulmonary hypertension April 2013   Cardiac cath on 03/06/12 - 1. Elevated pulmonary artery pressures, right sided filling pressures.,  2. PA: 64/45 (mean 53)    . On home oxygen therapy    "2-3 L 24/7" (05/03/2013)  . OSA (obstructive sleep apnea)    wears noctural BiPAP (05/03/2013)  . Periodontal disease   . Pneumonia ~ 1985; 01/2011  . Pulmonary embolus [facility](HCC) April 2013   Precipitating cause unclear. Was treated with coumadin from April-June 2013.   ROS: Gen: Denies fever, chills, weight change, fatigue, night sweats HEENT: Denies blurred vision, double vision, hearing loss, tinnitus, sinus congestion,  rhinorrhea, sore throat, neck stiffness, dysphagia PULM: per HPI CV: + chest pain, denies edema, orthopnea, paroxysmal nocturnal dyspnea, palpitations    O:  Vitals:   02/02/17 0600 02/02/17 0641 02/02/17 0800 02/02/17 0835  BP: (!) 76/39 (!) 80/47 (!) 104/52   Pulse: 68 69 76 (!) 106  Resp: 16 15 18 17   Temp:   98.1 F (36.7 C)   TempSrc:   Oral   SpO2: 95% 94% 95% (!) 89%  Weight:      Height:       BIPAP overnight 4L Grandfather daytime  On exam  Gen: obese, chronically ill appearing HENT: OP clear, TM's clear, neck supple PULM: Slight wheeze today, normal air movement, normal respiratory effort CV: RRR, no mgr, trace edema GI: BS+, soft, nontender Derm: no cyanosis or rash Psyche: normal mood and affect  CT chest images independently reviewed: mosaicism throughout the lungs bilaterally, ? Early paraseptal emphysema upper lobes, 2.1 cm ground glass nodule RUL  BMET    Component Value Date/Time   NA 138 02/02/2017 0336   K 3.8 02/02/2017 0336   CL 86 (L) 02/02/2017 0336   CO2 42 (H) 02/02/2017 0336   GLUCOSE 193 (H) 02/02/2017 0336   BUN 26 (H) 02/02/2017 0336   CREATININE 0.63 02/02/2017 0336   CREATININE 0.65 03/21/2014 1348   CALCIUM 9.3 02/02/2017 0336   GFRNONAA >60 02/02/2017 0336   GFRAA >60 02/02/2017 0336   Impression/Plan:  COPD exacerbation> continue prednisone, needs long slow taper, advised at length today to stay away  from smokers given the severity of her small airways disease; Dr. Carmelina Noun feels images explained by bronchiolitis, I believe that her premature birth also contributed to this pattern of parenchymal  abnormality.  Continue brovana/pulmicort. F/U nicotine metabolites. Continue hypertonic saline, continue mucinex.    Acute on chronic respiratory failure with hypercapnea> continue BIPAP qHS  Acute on chronic respiratory failure with hypoxemia> continue to titrate O2 to maintain O2 saturation 88-92%  Will move out to floor.  [place] Roesiger,  MD [place] [facility] Pager: [phone] Cell: [phone] After 3pm or if no response, call [phone]

## 2017-02-02 NOTE — Progress Notes (Signed)
Inpatient Diabetes Program Recommendations  AACE/ADA: New Consensus Statement on Inpatient Glycemic Control (2015)  Target Ranges:  Prepandial:   less than 140 mg/dL      Peak postprandial:   less than 180 mg/dL (1-2 hours)      Critically ill patients:  140 - 180 mg/dL   Results for Mary [name], Mary [name] (MRN [id number]) as of 02/02/2017 14:19  Ref. Range 02/02/2017 08:26 02/02/2017 12:33  Glucose-Capillary Latest Ref Range: 65 - 99 mg/dL 782196 (H) 956231 (H)   Review of Glycemic Control  Diabetes history: DM 2 Outpatient Diabetes medications: None  Current orders for Inpatient glycemic control: Novolog Resistant + HS scale  A1c 5.7% on 01/13/17  Inpatient Diabetes Program Recommendations:   Glucose elevated with steroids. Please consider Novolog 3-5 units TID meal coverage in addition to correction if patient is consuming at least 50% of meals.  Thanks,  Christena DeemShannon Ercell Razon RN, MSN, [facility] Inpatient Diabetes Coordinator Team Pager [phone] (8a-5p)

## 2017-02-03 DIAGNOSIS — J9622 Acute and chronic respiratory failure with hypercapnia: Secondary | ICD-10-CM

## 2017-02-03 LAB — BASIC METABOLIC PANEL
Anion gap: 9 (ref 5–15)
BUN: 17 mg/dL (ref 6–20)
CO2: 40 mmol/L — ABNORMAL HIGH (ref 22–32)
Calcium: 9.2 mg/dL (ref 8.9–10.3)
Chloride: 87 mmol/L — ABNORMAL LOW (ref 101–111)
Creatinine, Ser: 0.55 mg/dL (ref 0.44–1.00)
Glucose, Bld: 156 mg/dL — ABNORMAL HIGH (ref 65–99)
Potassium: 3.9 mmol/L (ref 3.5–5.1)
SODIUM: 136 mmol/L (ref 135–145)

## 2017-02-03 LAB — CBC
HEMATOCRIT: 40.9 % (ref 36.0–46.0)
Hemoglobin: 13 g/dL (ref 12.0–15.0)
MCH: 32.1 pg (ref 26.0–34.0)
MCHC: 31.8 g/dL (ref 30.0–36.0)
MCV: 101 fL — ABNORMAL HIGH (ref 78.0–100.0)
PLATELETS: 123 10*3/uL — AB (ref 150–400)
RBC: 4.05 MIL/uL (ref 3.87–5.11)
RDW: 13.7 % (ref 11.5–15.5)
WBC: 12.1 10*3/uL — AB (ref 4.0–10.5)

## 2017-02-03 LAB — GLUCOSE, CAPILLARY
GLUCOSE-CAPILLARY: 331 mg/dL — AB (ref 65–99)
GLUCOSE-CAPILLARY: 334 mg/dL — AB (ref 65–99)
Glucose-Capillary: 180 mg/dL — ABNORMAL HIGH (ref 65–99)
Glucose-Capillary: 212 mg/dL — ABNORMAL HIGH (ref 65–99)

## 2017-02-03 LAB — NICOTINE/COTININE METABOLITES
COTININE: 25.8 ng/mL
Nicotine: NOT DETECTED ng/mL

## 2017-02-03 MED ORDER — IPRATROPIUM-ALBUTEROL 0.5-2.5 (3) MG/3ML IN SOLN
3.0000 mL | Freq: Three times a day (TID) | RESPIRATORY_TRACT | Status: DC
  Administered 2017-02-03 – 2017-02-04 (×3): 3 mL via RESPIRATORY_TRACT
  Filled 2017-02-03 (×3): qty 3

## 2017-02-03 MED ORDER — GLIPIZIDE 5 MG PO TABS
5.0000 mg | ORAL_TABLET | Freq: Every day | ORAL | Status: DC
  Administered 2017-02-03: 5 mg via ORAL

## 2017-02-03 MED ORDER — GLIPIZIDE 10 MG PO TABS
10.0000 mg | ORAL_TABLET | Freq: Every day | ORAL | Status: DC
  Administered 2017-02-04: 10 mg via ORAL
  Filled 2017-02-03 (×2): qty 1

## 2017-02-03 NOTE — Progress Notes (Signed)
  Physical Therapy Treatment Patient Details Name: Mary [name] MRN: [id number] DOB: 03/16/1976 Today's Date: 02/03/2017    History of Present Illness Mary HakimMichelle L [name] is a 41 y.o. female with medical history significant of COPD with oxygen dependency of 3-1/2 L at baseline, DM2, Diastolic CHF, DVT, alcoholism, migraines, pulmonary hypertension, pulmonary embolus, CHF, fatty liver disease.    PT Comments    Assisted with amb a great distance on 3 lts (see details below)  Follow Up Recommendations  No PT follow up     Equipment Recommendations  None recommended by PT    Recommendations for Other Services       Precautions / Restrictions Precautions Precautions: Fall Precaution Comments: monitor O2    on home O2 Restrictions Weight Bearing Restrictions: No    Mobility  Bed Mobility Overal bed mobility: Modified Independent             General bed mobility comments: only for multiple lines, no physical assist  Transfers Overall transfer level: Needs assistance   Transfers: Sit to/from Stand;Stand Pivot Transfers Sit to Stand: Modified independent (Device/Increase time) Stand pivot transfers: Supervision       General transfer comment: for safety and line management  Ambulation/Gait Ambulation/Gait assistance: Supervision Ambulation Distance (Feet): 185 Feet Assistive device: Rolling walker (2 wheeled) Gait Pattern/deviations: Step-through pattern;Decreased stride length     General Gait Details: several standing rest breaks due to dyspnea     amb on 3 lts avg sats 92 - 87% with HR 119   Stairs            Wheelchair Mobility    Modified Rankin (Stroke Patients Only)       Balance                                    Cognition Arousal/Alertness: Awake/alert Behavior During Therapy: WFL for tasks assessed/performed Overall Cognitive Status: Within Functional Limits for tasks assessed                       Exercises      General Comments        Pertinent Vitals/Pain      Home Living                      Prior Function            PT Goals (current goals can now be found in the care plan section)      Frequency    Min 3X/week      PT Plan Current plan remains appropriate    Co-evaluation             End of Session Equipment Utilized During Treatment: Gait belt Activity Tolerance: Patient tolerated treatment well Patient left: in bed;with call bell/phone within reach   PT Visit Diagnosis: Difficulty in walking, not elsewhere classified (R26.2)     Time: 1452-1510 PT Time Calculation (min) (ACUTE ONLY): 18 min  Charges:  $Gait Training: 8-22 mins                    G Codes:       Felecia ShellingLori Bryston Colocho  PTA [facility]  Acute  Rehab Pager      [phone]

## 2017-02-03 NOTE — Progress Notes (Signed)
   02/03/17 1145  Mobility  Activity Ambulate in hall  Level of Assistance Independent after set-up  Assistive Device None  Distance Ambulated (ft) (Pt ambulated from room to window at end of hall/back -300 ft)  Ambulation Response Tolerated fair (titrated O2 from 3.5 to 5 L to maintain sat level)  Transport method Ambulatory    Ambulation Study  3.5 L maintain sat at 82% upon ambulation 11:45-11:47 Increased O2 to 4L maintain sat 84-86%     11:47-11:50 Increased O2 to 5L maintain sat 91%  11:50-11:55  Noralee SpaceJulian Paulino Cork SN 02/03/2017 12:19 PM  Co-sign Albesa SeenLindsay Vance RN

## 2017-02-03 NOTE — Care Management Note (Signed)
Case Management Note  Patient Details  Name: Mary [name] MRN: [id number] Date of Birth: 05/23/1976  Subjective/Objective:                  COPD  Action/Plan:  Discharge planning Expected Discharge Date:   (unknown)               Expected Discharge Plan:  Home/Self Care  In-House Referral:     Discharge planning Services  CM Consult  Post Acute Care Choice:    Choice offered to:  Patient  DME Arranged:  N/A DME Agency:  NA  HH Arranged:  NA HH Agency:  NA  Status of Service:  In process, will continue to follow  If discussed at Long Length of Stay Meetings, dates discussed:    Additional Comments: CM met with pt in room and pt states she thinks her BiPAP at home may not be working.  CM contacted [place], [place] and pt will call the number on her discharge instructions to arrange for a tech to come to the house to check on the BiPAP.  Pt has O2 through [facility] and husband will bring tank to hospital on day of discharge for transport home.  CM will continue to follow.   Dellie Catholic, RN 02/03/2017, 2:27 PM

## 2017-02-03 NOTE — Progress Notes (Addendum)
PROGRESS NOTE    Mary [name]   WUJ:[id number]  DOB: [birth year]  DOA: 01/31/2017 PCP: Jeanine Luz, FNP   Brief Narrative:  Mary [name] is a 41 y.o. female with medical history significant of COPD with oxygen dependency of 3-1/2 L at baseline, DM2, Diastolic CHF, DVT, alcoholism, migraines, pulmonary hypertension, pulmonary embolus, CHF & fatty liver disease.    Presented with worsening shortness of breath at baseline COPD on 3.5 L. States she just finished her prednisone taper the day before after her recent discharge. Reports sudden shortness of breath. She checked her Pulse ox and it was 45%.   She called EMS Patient was given 10 mg of albuterol and 0.5 Atrovent 125 Solu-Medrol and 2 g of magnesium oxygen had to be increased up to 4 L and then required nonrebreather.  Subjective: Felt lightheaded when walking today. Pulse ox dropped and HR when up.   Assessment & Plan:   Active Problems:  Acute on chronic respiratory failure with hypoxia,  COPD exacerbation  H/o severe COPD with CO2 retention and Cor pulmonale - multiple admits for the same, well known to pulmonary service. Has been recommended to talk to palliative care and declined it in the past- [facility] hosp[facility] has seen her and she is accepting their services  - noted to have Bronchiolitis on CT - ambulated today - pulse ox 86% on 3 L on exertion- O2 was turned up to 5 L to get pulse ox up to 91% - Pulm recommendation: " Would plan to wean prednisone as follows: Take 40mg  po daily for 3 days, then take 30mg  po daily for 3 days, then take 20mg  po daily for two days, then take 10mg  po daily for 2 days; then after completing taper she will resume 5mg  daily >>At home she should continue Breo and Spiriva.   >> follow up nicotine levels (I question compliance) >> advised at length to have her husband stop smoking around her  >>If machine normal, may need outpatient repeat BIPAP titration study as outpatient O2 saturation  remains > 88% she could go home F/u with pulmonary [name] on March 12 @ 15:30"     OSA treated with BiPAP - cont at bedtime    Diabetes mellitus type 2 in obese  - cont SSI as she is on steroids - A1c 5.7 on 2/18- does not take meds at home - sugars now up to 300s despite resistant SSI and carb mod diet- added 3 U with meals - sugars still high - add glipizide today at dinner - can tell her to stop after she completes the Prednisone    Acute on chronic diastolic CHF - takes Demadex at home- will resume     Alcohol abuse - no recent use per patient    Thrombocytopenia - acute, follow   DVT prophylaxis:  Lovenox Code Status: Full code Family Communication:  Disposition Plan: home when stable Consultants:    PCCM Procedures:    Antimicrobials:  Anti-infectives    Start     Dose/Rate Route Frequency Ordered Stop   01/31/17 2200  doxycycline (VIBRA-TABS) tablet 100 mg     100 mg Oral Every 12 hours 01/31/17 2036         Objective: Vitals:   02/03/17 1147 02/03/17 1150 02/03/17 1300 02/03/17 1415  BP:   123/81 128/86  Pulse:   97 97  Resp:   16 16  Temp:    98.1 F (36.7 C)  TempSrc:  SpO2: (!) 86% 91% 93% 91%  Weight:      Height:        Intake/Output Summary (Last 24 hours) at 02/03/17 1723 Last data filed at 02/03/17 1300  Gross per 24 hour  Intake              900 ml  Output             2900 ml  Net            -2000 ml   Filed Weights   02/01/17 0430 02/02/17 0314 02/02/17 1204  Weight: 90.9 kg (200 lb 6.4 oz) 94 kg (207 lb 3.7 oz) 93.9 kg (207 lb 0.2 oz)    Examination: General exam: Appears comfortable  HEENT: PERRLA, oral mucosa moist, no sclera icterus or thrush Respiratory system: Clear to auscultation. Respiratory effort normal. 92% on 3.5 L at this time Cardiovascular system: S1 & S2 heard, RRR.  No murmurs  Gastrointestinal system: Abdomen soft, non-tender, nondistended. Normal bowel sound. No organomegaly Central nervous  system: Alert and oriented. No focal neurological deficits. Extremities: No cyanosis, clubbing or edema Skin: No rashes or ulcers Psychiatry:  Mood & affect appropriate.     Data Reviewed: I have personally reviewed following labs and imaging studies  CBC:  Recent Labs Lab 01/31/17 1727 02/01/17 0322 02/02/17 0336 02/03/17 0532  WBC 8.6 9.9 16.4* 12.1*  NEUTROABS 6.7  --   --   --   HGB 13.7 13.4 13.7 13.0  HCT 43.9 43.1 44.2 40.9  MCV 103.3* 102.4* 104.2* 101.0*  PLT 97* 95* 118* 123*   Basic Metabolic Panel:  Recent Labs Lab 01/31/17 1727 02/01/17 0322 02/02/17 0336 02/03/17 0532  NA 142 138 138 136  K 3.6 4.1 3.8 3.9  CL 95* 92* 86* 87*  CO2 38* 36* 42* 40*  GLUCOSE 212* 233* 193* 156*  BUN 13 9 26* 17  CREATININE 0.53 0.46 0.63 0.55  CALCIUM 8.7* 9.0 9.3 9.2  MG  --  2.0  --   --   PHOS  --  2.6  --   --    GFR: Estimated Creatinine Clearance: 100.8 mL/min (by C-G formula based on SCr of 0.55 mg/dL). Liver Function Tests:  Recent Labs Lab 01/31/17 1727 02/01/17 0322  AST 44* 32  ALT 50 49  ALKPHOS 107 90  BILITOT 0.7 0.5  PROT 6.9 6.8  ALBUMIN 4.1 3.9   No results for input(s): LIPASE, AMYLASE in the last 168 hours. No results for input(s): AMMONIA in the last 168 hours. Coagulation Profile:  Recent Labs Lab 02/01/17 0322  INR 1.02   Cardiac Enzymes:  Recent Labs Lab 01/31/17 1727 01/31/17 2036 02/01/17 0107 02/01/17 0648  TROPONINI <0.03 <0.03 <0.03 <0.03   BNP (last 3 results)  Recent Labs  04/20/16 1703  PROBNP 30.0   HbA1C:  Recent Labs  02/01/17 0322  HGBA1C 6.3*   CBG:  Recent Labs Lab 02/02/17 1644 02/02/17 2034 02/03/17 0738 02/03/17 1133 02/03/17 1716  GLUCAP 376* 202* 180* 331* 334*   Lipid Profile: No results for input(s): CHOL, HDL, LDLCALC, TRIG, CHOLHDL, LDLDIRECT in the last 72 hours. Thyroid Function Tests:  Recent Labs  02/01/17 0322  TSH 0.561   Anemia Panel: No results for input(s):  VITAMINB12, FOLATE, FERRITIN, TIBC, IRON, RETICCTPCT in the last 72 hours. Urine analysis:    Component Value Date/Time   COLORURINE STRAW (A) 01/31/2017 2144   APPEARANCEUR CLEAR 01/31/2017 2144   LABSPEC 1.015 01/31/2017  2144   PHURINE 5.0 01/31/2017 2144   GLUCOSEU >=500 (A) 01/31/2017 2144   HGBUR NEGATIVE 01/31/2017 2144   BILIRUBINUR NEGATIVE 01/31/2017 2144   KETONESUR NEGATIVE 01/31/2017 2144   PROTEINUR NEGATIVE 01/31/2017 2144   UROBILINOGEN 1.0 07/25/2015 1400   NITRITE NEGATIVE 01/31/2017 2144   LEUKOCYTESUR NEGATIVE 01/31/2017 2144   Sepsis Labs: @LABRCNTIP (procalcitonin:4,lacticidven:4) )No results found for this or any previous visit (from the past 240 hour(s)).       Radiology Studies: Dg Swallowing Func-speech Pathology  Result Date: 02/02/2017 Objective Swallowing Evaluation: Type of Study: MBS-Modified Barium Swallow Study Patient Details Name: Jana HakimMichelle L Emmerich MRN: [id number] Date of Birth: 01/07/1976 Today's Date: 02/02/2017 Time: SLP Start Time (ACUTE ONLY): 1135-SLP Stop Time (ACUTE ONLY): 1155 SLP Time Calculation (min) (ACUTE ONLY): 20 min Past Medical History: Past Medical History: Diagnosis Date . Axillary adenopathy   right axillary adenopathy noted on CT chest (03/06/2012) . Chronic right-sided CHF (congestive heart failure) 03/23/2012  with cor pulmonale. Last RHC 03/2012 . COPD (chronic obstructive pulmonary disease) (HCC)   PFTS 03/08/12: fev1 0.58L/1%, FVC 1.18/33%, Ratop 49 and c/w ssevere obstruction. 21% BD response on FVC, RV 219%, DLCO 11/54% . DVT of upper extremity (deep vein thrombosis) [facility](HCC) April 2013  right subclavian // Unclear precipitating cause - possibly significant right heart failure, with vascular stasis potentially predisposing to clotting.   . Exertional shortness of breath  . Hepatic cirrhosis (HCC)   Questionable history of - Noted on CT abdomen (03/2012) - thought to be due to vascular congestion from right heart failure +/- patient's  history of alcohol abuse . History of alcoholism (HCC)  . History of chronic bronchitis  . Irregular menses  . Migraine   "~ 1/yr" (05/03/2013) . Moderate to severe pulmonary hypertension April 2013  Cardiac cath on 03/06/12 - 1. Elevated pulmonary artery pressures, right sided filling pressures.,  2. PA: 64/45 (mean 53)   . On home oxygen therapy   "2-3 L 24/7" (05/03/2013) . OSA (obstructive sleep apnea)   wears noctural BiPAP (05/03/2013) . Periodontal disease  . Pneumonia ~ 1985; 01/2011 . Pulmonary embolus [facility](HCC) April 2013  Precipitating cause unclear. Was treated with coumadin from April-June 2013. Past Surgical History: Past Surgical History: Procedure Laterality Date . APPENDECTOMY  05/03/2013 . CARDIAC CATHETERIZATION  03/2012 . CARDIAC CATHETERIZATION N/A 01/08/2016  Procedure: Right Heart Cath;  Surgeon: Dolores Pattyaniel R Bensimhon, MD;  [facility];  Service: Cardiovascular;  Laterality: N/A; . CARDIAC SURGERY  10/16/1977  "my heart was backwards" (05/03/2013) . LAPAROSCOPIC APPENDECTOMY N/A 05/03/2013  Procedure: APPENDECTOMY LAPAROSCOPIC;  Surgeon: Cherylynn RidgesJames O Wyatt, MD;  [facility];  Service: General;  Laterality: N/A; . LUNG SURGERY  [birth year]16/1976 . RIGHT HEART CATHETERIZATION N/A 03/07/2012  Procedure: RIGHT HEART CATH;  Surgeon: Kathleene Hazelhristopher D McAlhany, MD;  [facility];  Service: Cardiovascular;  Laterality: N/A; HPI: 41 year old female admitted 01/31/17 with acute on chronic respiratory failure with hypoxia. PMH significant for COPD, ETOH abuse, DVT, PE, HTN, chronic bronchitis, PNA, OSA, migraines, CHF, SOB.  Subjective: Pt seen in radiology for MBS to objectively assess swallow function and safety Assessment / Plan / Recommendation CHL IP CLINICAL IMPRESSIONS 02/02/2017 Clinical Impression Pt presents with normal oral, mild pharyngeal dysphagia, as well as some degree of esophageal dysmotility. Pharyngeal swallow is characterized by delayed swallow reflex on puree and solid consistencies. No  penetration, aspiration, post-swallow residue, or backflow to the pharynx was noted on any consistency. The barium tablet was given  with a sip of water, and while it cleared the oropharynx without difficulty or delay, esophageal sweep revealed it paused in the mid-thoracic region. Several minutes and multiple boluses of liquid were given to assist in clearing the pill. Recommend continuing with regular diet and thin liquids, following precautions for management of esophageal dysmotility (which were provided and reviewed with pt). Also recommend taking meds one at a time, and following large pills with additional boluses of water and/or allowing extra time between pills.  SLP Visit Diagnosis Dysphagia, pharyngoesophageal phase (R13.14) Impact on safety and function Mild aspiration risk   CHL IP TREATMENT RECOMMENDATION 02/02/2017 Treatment Recommendations No treatment recommended at this time. Education completed in radiology immediately following this study.   Prognosis 02/02/2017 Prognosis for Safe Diet Advancement Good CHL IP DIET RECOMMENDATION 02/02/2017 SLP Diet Recommendations Regular solids;Thin liquid Liquid Administration via Cup;Straw Medication Administration Whole meds with puree one at a time, following large pills with several boluses of water. Compensations Minimize environmental distractions;Slow rate;Small sips/bites;Follow solids with liquid Postural Changes Seated upright at 90 degrees; Remain semi-upright after after feeds/meals    CHL IP OTHER RECOMMENDATIONS 02/02/2017 Oral Care Recommendations Oral care BID    CHL IP ORAL PHASE 02/02/2017 Oral Phase WFL  CHL IP PHARYNGEAL PHASE 02/02/2017 Pharyngeal Phase Impaired Pharyngeal- Thin Cup WFL Pharyngeal- Thin Straw WFL Pharyngeal- Puree Delayed swallow initiation-vallecula Pharyngeal- Regular Delayed swallow initiation-vallecula Pharyngeal- Pill WFL  CHL IP CERVICAL ESOPHAGEAL PHASE 02/02/2017 Cervical Esophageal Phase Impaired Pill Barium tablet cleared  the oropharynx without delay or difficulty. Upon esophageal sweep, pill was noted to be stopped at the mid-thoracic region. Several minutes and multiple boluses of thin liquid were required to clear barium tablet. Celia B. B[place], [facility], CCC-SLP [phone] Leigh Aurora 02/02/2017, 12:52 PM                 Scheduled Meds: . arformoterol  15 mcg Nebulization BID  . budesonide (PULMICORT) nebulizer solution  0.5 mg Nebulization BID  . chlorhexidine  15 mL Mouth Rinse BID  . doxycycline  100 mg Oral Q12H  . enoxaparin (LOVENOX) injection  40 mg Subcutaneous Q24H  . gabapentin  600 mg Oral TID  . guaiFENesin  1,200 mg Oral BID  . insulin aspart  0-20 Units Subcutaneous TID WC  . insulin aspart  0-5 Units Subcutaneous QHS  . insulin aspart  3 Units Subcutaneous TID WC  . ipratropium-albuterol  3 mL Nebulization TID  . mouth rinse  15 mL Mouth Rinse q12n4p  . pantoprazole  40 mg Oral BID AC  . potassium chloride SA  20 mEq Oral Daily  . predniSONE  40 mg Oral Q breakfast  . roflumilast  500 mcg Oral Daily  . senna  1 tablet Oral BID  . sodium chloride flush  3 mL Intravenous Q12H  . sodium chloride flush  3 mL Intravenous Q12H  . sodium chloride HYPERTONIC  4 mL Nebulization BID  . torsemide  20 mg Oral Daily   Continuous Infusions:   LOS: 3 days    Time spent in minutes: 35    Analeese Andreatta, MD [facility] Pager: [email or web address] Password [facility] 02/03/2017, 5:23 PM

## 2017-02-03 NOTE — Progress Notes (Signed)
[facility] Attending Note  Brief: [facility] patient with COPD, sleep apnea, prior congenital heart surgery, born prematurely here with another exacerbation of COPD.  S:Slept OK, walked yesterday, but her O2 saturation dropped to 85%on 3.5 L; SLP evaluation normal, had some trouble swallowing larger pills; still has some cough, mild dyspnea but overall improved.   Past Medical History:  Diagnosis Date  . Axillary adenopathy    right axillary adenopathy noted on CT chest (03/06/2012)  . Chronic right-sided CHF (congestive heart failure) 03/23/2012   with cor pulmonale. Last RHC 03/2012  . COPD (chronic obstructive pulmonary disease) (HCC)    PFTS 03/08/12: fev1 0.58L/1%, FVC 1.18/33%, Ratop 49 and c/w ssevere obstruction. 21% BD response on FVC, RV 219%, DLCO 11/54%  . DVT of upper extremity (deep vein thrombosis) [facility](HCC) April 2013   right subclavian // Unclear precipitating cause - possibly significant right heart failure, with vascular stasis potentially predisposing to clotting.    . Exertional shortness of breath   . Hepatic cirrhosis (HCC)    Questionable history of - Noted on CT abdomen (03/2012) - thought to be due to vascular congestion from right heart failure +/- patient's history of alcohol abuse  . History of alcoholism (HCC)   . History of chronic bronchitis   . Irregular menses   . Migraine    "~ 1/yr" (05/03/2013)  . Moderate to severe pulmonary hypertension April 2013   Cardiac cath on 03/06/12 - 1. Elevated pulmonary artery pressures, right sided filling pressures.,  2. PA: 64/45 (mean 53)    . On home oxygen therapy    "2-3 L 24/7" (05/03/2013)  . OSA (obstructive sleep apnea)    wears noctural BiPAP (05/03/2013)  . Periodontal disease   . Pneumonia ~ 1985; 01/2011  . Pulmonary embolus [facility](HCC) April 2013   Precipitating cause unclear. Was treated with coumadin from April-June 2013.   ROS: Gen: Denies fever, chills, weight change, fatigue, night sweats HEENT: Denies  blurred vision, double vision, hearing loss, tinnitus, sinus congestion, rhinorrhea, sore throat, neck stiffness, dysphagia PULM: per HPI CV: Denies chest pain, edema, orthopnea, paroxysmal nocturnal dyspnea, palpitations     O:  Vitals:   02/02/17 2029 02/03/17 0500 02/03/17 0845 02/03/17 1018  BP: 123/82 107/66 106/68   Pulse: 98 (!) 115 (!) 102   Resp: 18 18 18    Temp: 97.3 F (36.3 C) 97.8 F (36.6 C) 97.8 F (36.6 C)   TempSrc: Oral Oral Oral   SpO2: 91% 97% 94% 95%  Weight:      Height:        3.5 L [place]   On exam  Gen: obese, no distress  HENT: OP clear, thick neck supple PULM: CTA B, normal percussion, no wheezing CV: RRR, no mgr, trace edema GI: BS+, soft, nontender Derm: no cyanosis or rash Psyche: normal mood and affect  CT chest 2/27: mosaicism throughout the lungs bilaterally, ? Early paraseptal emphysema upper lobes, 2.1 cm ground glass nodule RUL  SLP evaluation 2/28: no overt aspiration  BMET    Component Value Date/Time   NA 136 02/03/2017 0532   K 3.9 02/03/2017 0532   CL 87 (L) 02/03/2017 0532   CO2 40 (H) 02/03/2017 0532   GLUCOSE 156 (H) 02/03/2017 0532   BUN 17 02/03/2017 0532   CREATININE 0.55 02/03/2017 0532   CREATININE 0.65 03/21/2014 1348   CALCIUM 9.2 02/03/2017 0532   GFRNONAA >60 02/03/2017 0532   GFRAA >60 02/03/2017 0532   Impression/Plan:  COPD exacerbation> improved, could go home if oxygenation OK with ambulation.  She tells me today that her nebulizer at home is working and her oxygen concentrator is new and working well.   >>Would plan to wean prednisone as follows: Take 40mg  po daily for 3 days, then take 30mg  po daily for 3 days, then take 20mg  po daily for two days, then take 10mg  po daily for 2 days; then after completing taper she will resume 5mg  daily >>At home she should continue Breo and Spiriva.   >> follow up nicotine levels (I question compliance) >> advised at length to have her husband stop smoking around  her  Acute on chronic respiratory failure with hypercapnea> continue BIPAP qHS, she notes that she has been having trouble with dyspnea in the middle of the night at home, wants to have BIPAP checked, have sent message to Case management.   >>If machine normal, may need outpatient repeat BIPAP titration study as outpatient  Acute on chronic respiratory failure with hypoxemia> improving >> ambulate today on O2, if O2 saturation remains > 88% she could go home  F/u with pulmonary Rubye [facility] on March 12 @ 15:30  Heber [place], MD [facility] Pager: [phone] Cell: [phone] After 3pm or if no response, call [phone]

## 2017-02-04 DIAGNOSIS — J441 Chronic obstructive pulmonary disease with (acute) exacerbation: Secondary | ICD-10-CM

## 2017-02-04 DIAGNOSIS — J9612 Chronic respiratory failure with hypercapnia: Secondary | ICD-10-CM

## 2017-02-04 LAB — CBC
HEMATOCRIT: 44.7 % (ref 36.0–46.0)
HEMOGLOBIN: 14.2 g/dL (ref 12.0–15.0)
MCH: 32.1 pg (ref 26.0–34.0)
MCHC: 31.8 g/dL (ref 30.0–36.0)
MCV: 100.9 fL — AB (ref 78.0–100.0)
Platelets: 152 10*3/uL (ref 150–400)
RBC: 4.43 MIL/uL (ref 3.87–5.11)
RDW: 13.8 % (ref 11.5–15.5)
WBC: 13.7 10*3/uL — ABNORMAL HIGH (ref 4.0–10.5)

## 2017-02-04 LAB — GLUCOSE, CAPILLARY
GLUCOSE-CAPILLARY: 191 mg/dL — AB (ref 65–99)
GLUCOSE-CAPILLARY: 242 mg/dL — AB (ref 65–99)

## 2017-02-04 MED ORDER — GLIPIZIDE 5 MG PO TABS: 5.0000 mg | ORAL_TABLET | Freq: Every day | ORAL | 0 refills | Status: DC

## 2017-02-04 MED ORDER — PREDNISONE 5 MG PO TABS: ORAL_TABLET | ORAL | 2 refills | Status: DC

## 2017-02-04 NOTE — Discharge Summary (Signed)
Mary [name], is a 41 y.o. female  DOB May 11, 1976  MRN [id number].  Admission date:  01/31/2017  Admitting Physician  Mary Doyne, MD  Discharge Date:  02/04/2017   Primary MD  Mary Luz, FNP  Recommendations for primary care physician for things to follow:   Hospice consult  Admission Diagnosis  COPD exacerbation (HCC) [J44.1] Acute on chronic respiratory failure with hypercapnia (HCC) [J96.22] Acute on chronic respiratory failure with hypoxia (HCC) [J96.21]   Discharge Diagnosis  COPD exacerbation (HCC) [J44.1] Acute on chronic respiratory failure with hypercapnia (HCC) [J96.22] Acute on chronic respiratory failure with hypoxia (HCC) [J96.21]    Active Problems:   Obesity   OSA on BiPAP   Chronic respiratory failure with hypercapnia (HCC)   Chronic right-sided CHF (congestive heart failure)   Acute on chronic respiratory failure with hypoxia (HCC)   Gastroparesis   COPD (chronic obstructive pulmonary disease) (HCC)   PAH (pulmonary artery hypertension)   COPD exacerbation (HCC)   OSA treated with BiPAP   Diabetes mellitus type 2 in obese (HCC)   Acute on chronic diastolic CHF (congestive heart failure) (HCC)   Alcohol abuse   Thrombocytopenia (HCC)   Acute on chronic respiratory failure with hypercapnia (HCC)   Acute exacerbation of chronic obstructive pulmonary disease (COPD) (HCC)      Past Medical History:  Diagnosis Date  . Axillary adenopathy    right axillary adenopathy noted on CT chest (03/06/2012)  . Chronic right-sided CHF (congestive heart failure) 03/23/2012   with cor pulmonale. Last RHC 03/2012  . COPD (chronic obstructive pulmonary disease) (HCC)    PFTS 03/08/12: fev1 0.58L/1%, FVC 1.18/33%, Ratop 49 and c/w ssevere obstruction. 21% BD response on FVC, RV 219%, DLCO 11/54%  . DVT of upper extremity (deep vein thrombosis) [facility]) April 2013   right subclavian  // Unclear precipitating cause - possibly significant right heart failure, with vascular stasis potentially predisposing to clotting.    . Exertional shortness of breath   . Hepatic cirrhosis (HCC)    Questionable history of - Noted on CT abdomen (03/2012) - thought to be due to vascular congestion from right heart failure +/- patient's history of alcohol abuse  . History of alcoholism (HCC)   . History of chronic bronchitis   . Irregular menses   . Migraine    "~ 1/yr" (05/03/2013)  . Moderate to severe pulmonary hypertension April 2013   Cardiac cath on 03/06/12 - 1. Elevated pulmonary artery pressures, right sided filling pressures.,  2. PA: 64/45 (mean 53)    . On home oxygen therapy    "2-3 L 24/7" (05/03/2013)  . OSA (obstructive sleep apnea)    wears noctural BiPAP (05/03/2013)  . Periodontal disease   . Pneumonia ~ 1985; 01/2011  . Pulmonary embolus [facility]) April 2013   Precipitating cause unclear. Was treated with coumadin from April-June 2013.    Past Surgical History:  Procedure Laterality Date  . APPENDECTOMY  05/03/2013  . CARDIAC CATHETERIZATION  03/2012  . CARDIAC CATHETERIZATION N/A  01/08/2016   Procedure: Right Heart Cath;  Surgeon: Dolores Patty, MD;  Location[facility] INVASIVE CV LAB;  Service: Cardiovascular;  Laterality: N/A;  . CARDIAC SURGERY  12/04/1977   "my heart was backwards" (05/03/2013)  . LAPAROSCOPIC APPENDECTOMY N/A 05/03/2013   Procedure: APPENDECTOMY LAPAROSCOPIC;  Surgeon: Cherylynn Ridges, MD;  Location[facility] OR;  Service: General;  Laterality: N/A;  . LUNG SURGERY  05[birth year]19[birth year]  . RIGHT HEART CATHETERIZATION N/A 03/07/2012   Procedure: RIGHT HEART CATH;  Surgeon: Kathleene Hazel, MD;  Location: [facility] CATH LAB;  Service: Cardiovascular;  Laterality: N/A;       HPI  from the history and physical done on the day of admission:  Mary [name] is a 41 y.o. female with medical history significant of COPD with oxygen dependency of 3-1/2 L at baseline, DM2,  Diastolic CHF, DVT, alcoholism, migraines, pulmonary hypertension, pulmonary embolus, CHF, fatty liver disease.    Presented with worsening shortness of breath at baseline COPD on 3.5 L. States she just finished her prednisone taper after her recent discharge. Reports got short of breath once she got up yesterday today she checked her Pulse ox and it was shoeing 45%. Repots severe fatigue.  She called EMS Patient was given 10 mg of albuterol and 0.5 Atrovent 125 Solu-Medrol and 2 g of magnesium oxygen had to be increased up to 4 L and then required nonrebreather. Denies any fevers or chills no chest pain, no sick contacts, no travels. No leg edema but noted that her face has been swelling. She is unable to check her weight because her scale is broken.   recently admitted for COPD exacerbation discharged to home on 01/15/2017 with 3 days of antibiotic and prednisone taper.  Reports blood sugar is better once off steroids. Repots last EtOH was before her prior admission she have not had any alcohol since.  She has not smoked for past 5 years  Regarding pertinent Chronic problems: His last echogram was in September 2017 showed preserved EF.  Patient has known diabetes worsened but she'll control secondary to steroid use Patient has sleep apnea and uses BiPAP for nighttime and IN ER:  Temp (24hrs), Avg:98.2 F (36.8 C), Min:98.2 F (36.8 C), Max:98.2 F (36.8 C)  RR 20 satting 96% HR 109 BP 105/80 PH 7.283/87.5/70.3 Sodium 142 potassium 3.6 creatinine 0.53 bicarbonate 38 WBC 8.6 hemoglobin 13.7 platelets  97 down from baseline  Trop < 0.03 Chest x-ray cardiomegaly, small right pleural effusion and mild CHF possibly       Hospital Course:    Brief Narrative/Interval history Mary [name] a 41 y.o.femalewith medical history significant of COPD with oxygen dependency of 3-1/2 L at baseline, DM2, Diastolic CHF, DVT, alcoholism, migraines, pulmonary hypertension, pulmonary embolus, CHF  & fatty liver disease.   Presented with worsening shortness of breath at baseline COPD on 3.5 L.States she just finished her prednisone taper the day before after her recent discharge. Reports sudden shortness of breath. She checked her Pulse ox and it was 45%.  She called EMS Patient was given 10 mg of albuterol and 0.5 Atrovent 125 Solu-Medrol and 2 g of magnesium oxygen had to be increased up to 4 L and then required nonrebreather.   Plan-  1)Acute on chronic Respiratory Failure with Hypoxia- secondary to  COPD exacerbation, overall much improved, patient states that she is back to baseline H/o severe COPD with CO2 retention and Cor pulmonale, pulmonology consult appreciated, history of recurrent admissions to the hospital  for same, CT chest suggest bronchiolitis, she considering hospice down the road, slow taper of prednisone, patient is chronically steroid dependent. BiPAP use and supplemental oxygen continuously as advised. Avoid tobacco exposure, F/u with pulmonary [name] on March 12 @ 15:30"   2) OSA treated with BiPAP-  BiPAP use advice, see #1 above  3) Diabetes Mellitus Type 2 in obese - A1c 5.7 on 2/18- does not take diabetic meds at home, hyperglycemia secondary to high-dose prednisone/steroids, glipizide 5 mg daily for 1 week only while on high-dose prednisone.    4)Acute on chronic Diastolic CHF- clinically appears compensated, continue Demadex  5)Alcohol Abuse- patient denies recent use, however came in somewhat thrombocytopenic, which has now resolved.   Discharge Condition: improved  Follow UP  Follow-up Information    [facility] Follow up.   Why:  call [facility] and ask for a technician to come out to the house and check the BiPaP. Contact information: [address] [place] [address] [phone]            Consults obtained - Pulmonologist  Diet and Activity recommendation:  As advised  Discharge Instructions     Discharge Instructions    Call MD for:  difficulty breathing, headache or visual disturbances    Complete by:  As directed    Call MD for:  persistant dizziness or light-headedness    Complete by:  As directed    Call MD for:  persistant nausea and vomiting    Complete by:  As directed    Call MD for:  temperature >100.4    Complete by:  As directed    Diet - low sodium heart healthy    Complete by:  As directed    Discharge instructions    Complete by:  As directed    1)Follow-up with Pulmonologist within 1 week 2)Consider Enrolment in Hospice 3)Take medications as prescribed 4) Taper Prenisone as follows Take  20 mg (4 Tab) qam with breakfast 1 week, then take 10 mg (2 tab) qam with breakfast 1 week, then 5 mg daily indefinitely 5)CPAP/BIPAP with sleep and for shortness of breath  6)Continuos Oxygen   Increase activity slowly    Complete by:  As directed         Discharge Medications     Allergies as of 02/04/2017   No Known Allergies     Medication List    TAKE these medications   albuterol 108 (90 Base) MCG/ACT inhaler Commonly known as:  PROVENTIL HFA;VENTOLIN HFA Inhale 2 puffs into the lungs every 6 (six) hours as needed for wheezing or shortness of breath.   BREO ELLIPTA 100-25 MCG/INH Aepb Generic drug:  fluticasone furoate-vilanterol INHALE 1 PUFF INTO THE LUNGS DAILY.   gabapentin 300 MG capsule Commonly known as:  NEURONTIN TAKE 2 CAPSULES BY MOUTH 3 TIMES DAILY.   glipiZIDE 5 MG tablet Commonly known as:  GLUCOTROL Take 1 tablet (5 mg total) by mouth daily before breakfast. For one week ONLY while on high-dose prednisone   pantoprazole 40 MG tablet Commonly known as:  PROTONIX Take 1 tablet (40 mg total) by mouth 2 (two) times daily before a meal. What changed:  when to take this   potassium chloride SA 20 MEQ tablet Commonly known as:  K-DUR,KLOR-CON Take 1 tablet (20 mEq total) by mouth daily.   predniSONE 5 MG tablet Commonly known as:   DELTASONE Take  20 mg (4 Tab) qam with breakfast 1 week,  then take 10 mg (2 tab) qam with breakfast 1 week, then 5 mg daily indefinitely What changed:  how much to take  how to take this  when to take this  additional instructions   roflumilast 500 MCG Tabs tablet Commonly known as:  DALIRESP Take 500 mcg by mouth daily.   SPIRIVA RESPIMAT 1.25 MCG/ACT Aers Generic drug:  Tiotropium Bromide Monohydrate INHALE 2 PUFFS INTO THE LUNGS 2 (TWO) TIMES DAILY. What changed:  when to take this   torsemide 20 MG tablet Commonly known as:  DEMADEX Take 1 tablet (20 mg total) by mouth daily.   traMADol 50 MG tablet Commonly known as:  ULTRAM TAKE 1 TABLET BY MOUTH EVERY 6 HOURS AS NEEDED FOR SEVERE PAIN       Major procedures and Radiology Reports - PLEASE review detailed and final reports for all details, in brief -   Ct Chest High Resolution  Result Date: 02/02/2017 CLINICAL DATA:  COPD, oxygen dependency, recent prednisone therapy, 7 shortness of breath. EXAM: CT CHEST WITHOUT CONTRAST TECHNIQUE: Multidetector CT imaging of the chest was performed following the standard protocol without intravenous contrast. High resolution imaging of the lungs, as well as inspiratory and expiratory imaging, was performed. COMPARISON:  CT chest exams dating back 12/27/2013. FINDINGS: Cardiovascular: Pulmonary arteries and heart are enlarged. No pericardial effusion. Mediastinum/Nodes: No pathologically enlarged mediastinal or axillary lymph nodes. Hilar regions are difficult to evaluate without IV contrast but appear grossly unremarkable. Esophagus is grossly unremarkable. Lungs/Pleura: Mosaic pulmonary parenchymal attenuation with scattered pulmonary parenchymal scarring and mild architectural distortion. No subpleural reticulation, traction bronchiectasis/bronchiolectasis, ground-glass or honeycombing. Focal area of ground-glass in the posterior segment right upper lobe measures 2.1 cm, stable from  01/13/2015 but minimally enlarged from 12/27/2013, at which time it measured approximately 1.7 cm. No solid components. No pleural fluid. Airway is unremarkable. Upper Abdomen: Liver appears enlarged enlarged and diffusely decreased in attenuation but is incompletely imaged. Visualized portions of the gallbladder, adrenal glands, right kidney unremarkable. Punctate stone left kidney. Visualized portions of the spleen, pancreas, stomach and bowel are grossly unremarkable. No upper abdominal adenopathy. Musculoskeletal: No worrisome lytic or sclerotic lesions. IMPRESSION: 1. Scattered pulmonary parenchymal scarring with mosaic pulmonary parenchymal attenuation and mild architectural distortion, suggestive of obliterative bronchiolitis. 2. No evidence of fibrotic interstitial lung disease. 3. Enlarged pulmonary arteries, indicative of pulmonary arterial hypertension. 4. Right upper lobe ground-glass nodule, stable from 01/18/2015 but minimally slightly enlarged from 12/27/2013. Additional follow-up could be obtained in 2 years to ensure continued stability. This recommendation follows the consensus statement: Guidelines for Management of Small Pulmonary Nodules Detected on CT Images: From the Fleischner Society 2017; Radiology 2017; 284:228-243. 5. Fatty enlarged liver. 6. Left renal stone. Electronically Signed   By: Leanna Battles M.D.   On: 02/02/2017 08:21   Dg Chest Port 1 View  Result Date: 01/31/2017 CLINICAL DATA:  Shortness of breath. EXAM: PORTABLE CHEST 1 VIEW COMPARISON:  01/11/2017.  08/25/2016.  07/18/2016.  11/13/2015. FINDINGS: Mediastinum and hilar structures are stable . Stable cardiomegaly. Mild basilar interstitial prominence. Small right pleural effusion. Left costophrenic angle not imaged. Mild CHF cannot be excluded. Left base pleural-parenchymal thickening again noted consistent scarring. IMPRESSION: 1. Cardiomegaly with mild basilar interstitial prominence of small right pleural effusion.  Mild CHF cannot be excluded. 2. Left base pleuroparenchymal thickening again noted consistent with scarring. Electronically Signed   By: Maisie Fus  Register   On: 01/31/2017 17:31   Dg Chest Port 1 View  Result Date: 01/11/2017  CLINICAL DATA:  Shortness of Breath EXAM: PORTABLE CHEST 1 VIEW COMPARISON:  01/11/2017 FINDINGS: Hyperinflation of the lungs. There is mild cardiomegaly. No confluent opacities, effusions or edema. No acute bony abnormality. IMPRESSION: Hyperinflation, mild cardiomegaly. Electronically Signed   By: Charlett Nose M.D.   On: 01/11/2017 12:21   Dg Chest Port 1 View  Result Date: 01/11/2017 CLINICAL DATA:  Shortness of breath and wheezing tonight. History of COPD. EXAM: PORTABLE CHEST 1 VIEW COMPARISON:  08/25/2016 FINDINGS: Emphysematous changes in the lungs. Normal heart size and pulmonary vascularity. No focal airspace disease or consolidation in the lungs. No blunting of costophrenic angles. No pneumothorax. Mediastinal contours appear intact. IMPRESSION: No active disease. Electronically Signed   By: Burman Nieves M.D.   On: 01/11/2017 04:45   Dg Abd Portable 1v  Result Date: 01/11/2017 CLINICAL DATA:  Severe shortness of breath and abdominal distention. EXAM: PORTABLE ABDOMEN - 1 VIEW COMPARISON:  11/15/2015 FINDINGS: 1210 hours. Supine portable view the abdomen shows no gaseous bowel dilatation to suggest obstruction. No unexpected abdominopelvic calcification. Visualized bony anatomy unremarkable. IMPRESSION: Negative. Electronically Signed   By: Kennith Center M.D.   On: 01/11/2017 12:21   Dg Swallowing Func-speech Pathology  Result Date: 02/02/2017 Objective Swallowing Evaluation: Type of Study: MBS-Modified Barium Swallow Study Patient Details Name: RAILEY GLAD MRN: [id number] Date of Birth: [birth year] Today's Date: 02/02/2017 Time: SLP Start Time (ACUTE ONLY): 1135-SLP Stop Time (ACUTE ONLY): 1155 SLP Time Calculation (min) (ACUTE ONLY): 20 min Past Medical History:  Past Medical History: Diagnosis Date . Axillary adenopathy   right axillary adenopathy noted on CT chest (03/06/2012) . Chronic right-sided CHF (congestive heart failure) 03/23/2012  with cor pulmonale. Last RHC 03/2012 . COPD (chronic obstructive pulmonary disease) (HCC)   PFTS 03/08/12: fev1 0.58L/1%, FVC 1.18/33%, Ratop 49 and c/w ssevere obstruction. 21% BD response on FVC, RV 219%, DLCO 11/54% . DVT of upper extremity (deep vein thrombosis) [facility]) April 2013  right subclavian // Unclear precipitating cause - possibly significant right heart failure, with vascular stasis potentially predisposing to clotting.   . Exertional shortness of breath  . Hepatic cirrhosis (HCC)   Questionable history of - Noted on CT abdomen (03/2012) - thought to be due to vascular congestion from right heart failure +/- patient's history of alcohol abuse . History of alcoholism (HCC)  . History of chronic bronchitis  . Irregular menses  . Migraine   "~ 1/yr" (05/03/2013) . Moderate to severe pulmonary hypertension April 2013  Cardiac cath on 03/06/12 - 1. Elevated pulmonary artery pressures, right sided filling pressures.,  2. PA: 64/45 (mean 53)   . On home oxygen therapy   "2-3 L 24/7" (05/03/2013) . OSA (obstructive sleep apnea)   wears noctural BiPAP (05/03/2013) . Periodontal disease  . Pneumonia ~ 1985; 01/2011 . Pulmonary embolus [facility]) April 2013  Precipitating cause unclear. Was treated with coumadin from April-June 2013. Past Surgical History: Past Surgical History: Procedure Laterality Date . APPENDECTOMY  05/03/2013 . CARDIAC CATHETERIZATION  03/2012 . CARDIAC CATHETERIZATION N/A 01/08/2016  Procedure: Right Heart Cath;  Surgeon: Dolores Patty, MD;  Location: [facility] INVASIVE CV LAB;  Service: Cardiovascular;  Laterality: N/A; . CARDIAC SURGERY  1977/04/[birth year]  "my heart was backwards" (05/03/2013) . LAPAROSCOPIC APPENDECTOMY N/A 05/03/2013  Procedure: APPENDECTOMY LAPAROSCOPIC;  Surgeon: Cherylynn Ridges, MD;  Location: [facility] OR;  Service: General;   Laterality: N/A; . LUNG SURGERY  December 20, 1976 . RIGHT HEART CATHETERIZATION N/A 03/07/2012  Procedure: RIGHT HEART CATH;  Surgeon: Nile Dear  Clifton James, MD;  Location: [facility] CATH LAB;  Service: Cardiovascular;  Laterality: N/A; HPI: 41 year old female admitted 01/31/17 with acute on chronic respiratory failure with hypoxia. PMH significant for COPD, ETOH abuse, DVT, PE, HTN, chronic bronchitis, PNA, OSA, migraines, CHF, SOB.  Subjective: Pt seen in radiology for MBS to objectively assess swallow function and safety Assessment / Plan / Recommendation CHL IP CLINICAL IMPRESSIONS 02/02/2017 Clinical Impression Pt presents with normal oral, mild pharyngeal dysphagia, as well as some degree of esophageal dysmotility. Pharyngeal swallow is characterized by delayed swallow reflex on puree and solid consistencies. No penetration, aspiration, post-swallow residue, or backflow to the pharynx was noted on any consistency. The barium tablet was given with a sip of water, and while it cleared the oropharynx without difficulty or delay, esophageal sweep revealed it paused in the mid-thoracic region. Several minutes and multiple boluses of liquid were given to assist in clearing the pill. Recommend continuing with regular diet and thin liquids, following precautions for management of esophageal dysmotility (which were provided and reviewed with pt). Also recommend taking meds one at a time, and following large pills with additional boluses of water and/or allowing extra time between pills.  SLP Visit Diagnosis Dysphagia, pharyngoesophageal phase (R13.14) Impact on safety and function Mild aspiration risk   CHL IP TREATMENT RECOMMENDATION 02/02/2017 Treatment Recommendations No treatment recommended at this time. Education completed in radiology immediately following this study.   Prognosis 02/02/2017 Prognosis for Safe Diet Advancement Good CHL IP DIET RECOMMENDATION 02/02/2017 SLP Diet Recommendations Regular solids;Thin liquid Liquid  Administration via Cup;Straw Medication Administration Whole meds with puree one at a time, following large pills with several boluses of water. Compensations Minimize environmental distractions;Slow rate;Small sips/bites;Follow solids with liquid Postural Changes Seated upright at 90 degrees; Remain semi-upright after after feeds/meals    CHL IP OTHER RECOMMENDATIONS 02/02/2017 Oral Care Recommendations Oral care BID    CHL IP ORAL PHASE 02/02/2017 Oral Phase WFL  CHL IP PHARYNGEAL PHASE 02/02/2017 Pharyngeal Phase Impaired Pharyngeal- Thin Cup WFL Pharyngeal- Thin Straw WFL Pharyngeal- Puree Delayed swallow initiation-vallecula Pharyngeal- Regular Delayed swallow initiation-vallecula Pharyngeal- Pill WFL  CHL IP CERVICAL ESOPHAGEAL PHASE 02/02/2017 Cervical Esophageal Phase Impaired Pill Barium tablet cleared the oropharynx without delay or difficulty. Upon esophageal sweep, pill was noted to be stopped at the mid-thoracic region. Several minutes and multiple boluses of thin liquid were required to clear barium tablet. Celia B. [place], [facility], CCC-SLP [phone] Leigh Aurora 02/02/2017, 12:52 PM               Micro Results   No results found for this or any previous visit (from the past 240 hour(s)).     Today   Subjective    Berklie Dethlefs today has no new c/o, pt is requesting discharge home today          Patient has been seen and examined prior to discharge   Objective   Blood pressure 112/71, pulse 93, temperature 98 F (36.7 C), temperature source Oral, resp. rate 18, height 5\' 2"  (1.575 m), weight 92.5 kg (204 lb), last menstrual period 01/11/2017, SpO2 96 %.   Intake/Output Summary (Last 24 hours) at 02/04/17 1542 Last data filed at 02/04/17 1341  Gross per 24 hour  Intake             1260 ml  Output             1400 ml  Net             -  140 ml    Exam Gen:- Awake  In no apparent distress  HEENT:- [place].AT,  [place] at 3L/min Neck-Supple Neck,No JVD,  Lungs- diminished  bilaterally, no significant adventitious sounds CV- S1, S2 normal Abd-  +ve B.Sounds, Abd Soft, No tenderness,    Extremity/Skin:- Intact peripheral pulses     Data Review   CBC w Diff: Lab Results  Component Value Date   WBC 13.7 (H) 02/04/2017   HGB 14.2 02/04/2017   HCT 44.7 02/04/2017   PLT 152 02/04/2017   LYMPHOPCT 17 01/31/2017   MONOPCT 4 01/31/2017   EOSPCT 2 01/31/2017   BASOPCT 0 01/31/2017    CMP: Lab Results  Component Value Date   NA 136 02/03/2017   K 3.9 02/03/2017   CL 87 (L) 02/03/2017   CO2 40 (H) 02/03/2017   BUN 17 02/03/2017   CREATININE 0.55 02/03/2017   CREATININE 0.65 03/21/2014   PROT 6.8 02/01/2017   ALBUMIN 3.9 02/01/2017   BILITOT 0.5 02/01/2017   ALKPHOS 90 02/01/2017   AST 32 02/01/2017   ALT 49 02/01/2017  .   Total Discharge time is about 33 minutes  Dhiren Azimi M.D on 02/04/2017 at 3:42 PM  [facility]   Office  [phone]  Dragon dictation system was used to create this note, attempts have been made to correct errors, however presence of uncorrected errors is not a reflection quality of care provided

## 2017-02-04 NOTE — Progress Notes (Signed)
Patient ambulated in halls on 3 liters oxygen.  Oxygen saturation stayed around 90 percent, with  2 momentary desaturations down to 86 percent.

## 2017-02-04 NOTE — Assessment & Plan Note (Signed)
Current weight 204 with goal of weight loss

## 2017-02-04 NOTE — Assessment & Plan Note (Signed)
Continue O2 and bipap

## 2017-02-04 NOTE — Assessment & Plan Note (Addendum)
Reported to have quick smoking 5 years ago but there is some concern for compliance. Wean steroids as instructed and resume BD as instructed.

## 2017-02-04 NOTE — Progress Notes (Signed)
Patient given discharge instructions, and verbalized an understanding of all discharge instructions.  Patient agrees with discharge plan, and is being discharged in stable medical condition.  Patient given transportation via wheelchair. 

## 2017-02-04 NOTE — Assessment & Plan Note (Signed)
Per IM

## 2017-02-04 NOTE — Assessment & Plan Note (Signed)
Continue O2 3.5 l/m at rest and 5 l/m with activity with goal of sats >88%

## 2017-02-04 NOTE — Assessment & Plan Note (Signed)
Follow daily weight and goal is negative I/o

## 2017-02-04 NOTE — Assessment & Plan Note (Signed)
Confirmed per ct

## 2017-02-04 NOTE — Progress Notes (Signed)
Brief: Obese WF with COPD,OSA,OHS on bipap, hx of congential heart surgery, O2 dependent x 5 years, frequent admissions and concern for non compliance with smoking.   S: Destated to 86% on 3.5 l/m with ambulation today and when increased to 4 l sats increased to 91%.  BP 133/85 (BP Location: Right Arm)   Pulse 83   Temp 97.9 F (36.6 C) (Oral)   Resp 20   Ht 5\' 2"  (1.575 m)   Wt 204 lb (92.5 kg)   LMP 01/11/2017 (Approximate) Comment: pt states aroudn 2 or 3 weeks ago  SpO2 98%   BMI 37.31 kg/m     Recent Labs Lab 02/01/17 0322 02/02/17 0336 02/03/17 0532  NA 138 138 136  K 4.1 3.8 3.9  CL 92* 86* 87*  CO2 36* 42* 40*  BUN 9 26* 17  CREATININE 0.46 0.63 0.55  GLUCOSE 233* 193* 156*    Recent Labs Lab 02/02/17 0336 02/03/17 0532 02/04/17 0654  HGB 13.7 13.0 14.2  HCT 44.2 40.9 44.7  WBC 16.4* 12.1* 13.7*  PLT 118* 123* 152    Dg Swallowing Func-speech Pathology  Result Date: 02/02/2017 Objective Swallowing Evaluation: Type of Study: MBS-Modified Barium Swallow Study Patient Details Name: Mary [name] MRN: [id number] Date of Birth: Mar 22, 1976 Today's Date: 02/02/2017 Time: SLP Start Time (ACUTE ONLY): 1135-SLP Stop Time (ACUTE ONLY): 1155 SLP Time Calculation (min) (ACUTE ONLY): 20 min Past Medical History: Past Medical History: Diagnosis Date . Axillary adenopathy   right axillary adenopathy noted on CT chest (03/06/2012) . Chronic right-sided CHF (congestive heart failure) 03/23/2012  with cor pulmonale. Last RHC 03/2012 . COPD (chronic obstructive pulmonary disease) (HCC)   PFTS 03/08/12: fev1 0.58L/1%, FVC 1.18/33%, Ratop 49 and c/w ssevere obstruction. 21% BD response on FVC, RV 219%, DLCO 11/54% . DVT of upper extremity (deep vein thrombosis) [facility]) April 2013  right subclavian // Unclear precipitating cause - possibly significant right heart failure, with vascular stasis potentially predisposing to clotting.   . Exertional shortness of breath  . Hepatic cirrhosis (HCC)    Questionable history of - Noted on CT abdomen (03/2012) - thought to be due to vascular congestion from right heart failure +/- patient's history of alcohol abuse . History of alcoholism (HCC)  . History of chronic bronchitis  . Irregular menses  . Migraine   "~ 1/yr" (05/03/2013) . Moderate to severe pulmonary hypertension April 2013  Cardiac cath on 03/06/12 - 1. Elevated pulmonary artery pressures, right sided filling pressures.,  2. PA: 64/45 (mean 53)   . On home oxygen therapy   "2-3 L 24/7" (05/03/2013) . OSA (obstructive sleep apnea)   wears noctural BiPAP (05/03/2013) . Periodontal disease  . Pneumonia ~ 1985; 01/2011 . Pulmonary embolus [facility]) April 2013  Precipitating cause unclear. Was treated with coumadin from April-June 2013. Past Surgical History: Past Surgical History: Procedure Laterality Date . APPENDECTOMY  05/03/2013 . CARDIAC CATHETERIZATION  03/2012 . CARDIAC CATHETERIZATION N/A 01/08/2016  Procedure: Right Heart Cath;  Surgeon: Dolores Patty, MD;  Location: [facility] INVASIVE CV LAB;  Service: Cardiovascular;  Laterality: N/A; . CARDIAC SURGERY  1976/01/29  "my heart was backwards" (05/03/2013) . LAPAROSCOPIC APPENDECTOMY N/A 05/03/2013  Procedure: APPENDECTOMY LAPAROSCOPIC;  Surgeon: Cherylynn Ridges, MD;  Location: [facility] OR;  Service: General;  Laterality: N/A; . LUNG SURGERY  04/08/77 . RIGHT HEART CATHETERIZATION N/A 03/07/2012  Procedure: RIGHT HEART CATH;  Surgeon: Kathleene Hazel, MD;  Location: [facility] CATH LAB;  Service: Cardiovascular;  Laterality: N/A; HPI:  41 year old female admitted 01/31/17 with acute on chronic respiratory failure with hypoxia. PMH significant for COPD, ETOH abuse, DVT, PE, HTN, chronic bronchitis, PNA, OSA, migraines, CHF, SOB.  Subjective: Pt seen in radiology for MBS to objectively assess swallow function and safety Assessment / Plan / Recommendation CHL IP CLINICAL IMPRESSIONS 02/02/2017 Clinical Impression Pt presents with normal oral, mild pharyngeal dysphagia, as well as  some degree of esophageal dysmotility. Pharyngeal swallow is characterized by delayed swallow reflex on puree and solid consistencies. No penetration, aspiration, post-swallow residue, or backflow to the pharynx was noted on any consistency. The barium tablet was given with a sip of water, and while it cleared the oropharynx without difficulty or delay, esophageal sweep revealed it paused in the mid-thoracic region. Several minutes and multiple boluses of liquid were given to assist in clearing the pill. Recommend continuing with regular diet and thin liquids, following precautions for management of esophageal dysmotility (which were provided and reviewed with pt). Also recommend taking meds one at a time, and following large pills with additional boluses of water and/or allowing extra time between pills.  SLP Visit Diagnosis Dysphagia, pharyngoesophageal phase (R13.14) Impact on safety and function Mild aspiration risk   CHL IP TREATMENT RECOMMENDATION 02/02/2017 Treatment Recommendations No treatment recommended at this time. Education completed in radiology immediately following this study.   Prognosis 02/02/2017 Prognosis for Safe Diet Advancement Good CHL IP DIET RECOMMENDATION 02/02/2017 SLP Diet Recommendations Regular solids;Thin liquid Liquid Administration via Cup;Straw Medication Administration Whole meds with puree one at a time, following large pills with several boluses of water. Compensations Minimize environmental distractions;Slow rate;Small sips/bites;Follow solids with liquid Postural Changes Seated upright at 90 degrees; Remain semi-upright after after feeds/meals    CHL IP OTHER RECOMMENDATIONS 02/02/2017 Oral Care Recommendations Oral care BID    CHL IP ORAL PHASE 02/02/2017 Oral Phase WFL  CHL IP PHARYNGEAL PHASE 02/02/2017 Pharyngeal Phase Impaired Pharyngeal- Thin Cup WFL Pharyngeal- Thin Straw WFL Pharyngeal- Puree Delayed swallow initiation-vallecula Pharyngeal- Regular Delayed swallow  initiation-vallecula Pharyngeal- Pill WFL  CHL IP CERVICAL ESOPHAGEAL PHASE 02/02/2017 Cervical Esophageal Phase Impaired Pill Barium tablet cleared the oropharynx without delay or difficulty. Upon esophageal sweep, pill was noted to be stopped at the mid-thoracic region. Several minutes and multiple boluses of thin liquid were required to clear barium tablet. Celia B. [place], [employer], CCC-SLP [phone] Leigh Aurora 02/02/2017, 12:52 PM               Intake/Output Summary (Last 24 hours) at 02/04/17 1036 Last data filed at 02/04/17 0939  Gross per 24 hour  Intake             1320 ml  Output             2400 ml  Net            -1080 ml   Filed Weights   02/02/17 1204 02/03/17 1700 02/04/17 0653  Weight: 207 lb 0.2 oz (93.9 kg) 202 lb 12.8 oz (92 kg) 204 lb (92.5 kg)   Exam: Awake and alert, follows command, no neuro defects. Reports desaturating with ambulation but sats increased to 91% with 4 l [place] No JVD/LAM HSR RRR Mild exp wheezes with some VCD component Abd Obese + bs Ext warm with little edema   Imp:   Obesity   OSA on BiPAP   Chronic respiratory failure with hypercapnia (HCC)   Chronic right-sided CHF (congestive heart failure)   Acute on chronic respiratory failure with hypoxia (  HCC)   Gastroparesis   COPD (chronic obstructive pulmonary disease) (HCC)   PAH (pulmonary artery hypertension)   COPD exacerbation (HCC)   OSA treated with BiPAP   Diabetes mellitus type 2 in obese (HCC)   Acute on chronic diastolic CHF (congestive heart failure) (HCC)   Alcohol abuse   Thrombocytopenia (HCC)   Acute on chronic respiratory failure with hypercapnia (HCC)  Plan: Increase O2 to 4-5 litres with activity Continue negative I/o Wean steroids as instructed BD as noted in 3/1 note. If Sats >88% with activity may be ready for dc. Key is negative I/O I believe.    Brett Canales Minor ACNP [facility] P[facility] Pager [phone] till 3 pm If no answer page [phone] 02/04/2017, 10:39 AM  ATTENDING  NOTE / ATTESTATION NOTE :   I have discussed the case with the resident/APP  Brett Canales Minor NP.   I agree with the resident/APP's  history, physical examination, assessment, and plans.    I have edited the above note and modified it according to our agreed history, physical examination, assessment and plan.   Briefly, Obese WF with COPD,OSA,OHS on bipap, hx of congential heart surgery, O2 dependent x 5 years, frequent admissions and concern for non compliance with smoking, admitted with AECOPD.     She is clinically improved.  Almost at baseline.  Walked just now on 3L o2 >>> sats stayed in 90s for the most part.    Vitals:  Vitals:   02/04/17 0547 02/04/17 0653 02/04/17 0849 02/04/17 0851  BP: 133/85     Pulse: 83     Resp: 20     Temp: 97.9 F (36.6 C)     TempSrc: Oral     SpO2: 98%  98% 98%  Weight:  92.5 kg (204 lb)    Height:        Constitutional/General: well-nourished, well-developed, not in any distress. Comfortable.   Body mass index is 37.31 kg/m. Wt Readings from Last 3 Encounters:  02/04/17 92.5 kg (204 lb)  01/15/17 91.4 kg (201 lb 6.4 oz)  11/09/16 91.4 kg (201 lb 6.4 oz)    HEENT: PERLA, anicteric sclerae. (-) Oral thrush.   Neck: No masses. Midline trachea. No JVD, (-) LAD. (-) bruits appreciated.  Respiratory/Chest: Grossly normal chest. (-) deformity. (-) Accessory muscle use.  Symmetric expansion. Diminished BS on both lower lung zones. (-) wheezing,rhonchi Some crackles at the bases.  (-) egophony  Cardiovascular: Regular rate and  rhythm, heart sounds normal, no murmur or gallops,  Trace peripheral edema  Gastrointestinal:  Normal bowel sounds. Soft, non-tender. No hepatosplenomegaly.  (-) masses.   Musculoskeletal:  Normal muscle tone.   Extremities: Grossly normal. (-) clubbing, cyanosis.  (-) edema  Skin: (-) rash,lesions seen.   Neurological/Psychiatric : sedated, intubated. CN grossly intact. (-) lateralizing signs.      CBC Recent Labs     02/02/17  0336  02/03/17  0532  02/04/17  0654  WBC  16.4*  12.1*  13.7*  HGB  13.7  13.0  14.2  HCT  44.2  40.9  44.7  PLT  118*  123*  152    Coag's No results for input(s): APTT, INR in the last 72 hours.  BMET Recent Labs     02/02/17  0336  02/03/17  0532  NA  138  136  K  3.8  3.9  CL  86*  87*  CO2  42*  40*  BUN  26*  17  CREATININE  0.63  0.55  GLUCOSE  193*  156*    Electrolytes Recent Labs     02/02/17  0336  02/03/17  0532  CALCIUM  9.3  9.2    Sepsis Markers No results for input(s): PROCALCITON, O2SATVEN in the last 72 hours.  Invalid input(s): LACTICACIDVEN  ABG No results for input(s): PHART, PCO2ART, PO2ART in the last 72 hours.  Liver Enzymes No results for input(s): AST, ALT, ALKPHOS, BILITOT, ALBUMIN in the last 72 hours.  Cardiac Enzymes No results for input(s): TROPONINI, PROBNP in the last 72 hours.  Glucose Recent Labs     02/02/17  2034  02/03/17  0738  02/03/17  1133  02/03/17  1716  02/03/17  2056  02/04/17  0739  GLUCAP  202*  180*  331*  334*  212*  191*    Imaging Dg Swallowing Func-speech Pathology  Result Date: 02/02/2017 Objective Swallowing Evaluation: Type of Study: MBS-Modified Barium Swallow Study Patient Details Name: Mary [name] MRN: [id number] Date of Birth: 06/30/1976 Today's Date: 02/02/2017 Time: SLP Start Time (ACUTE ONLY): 1135-SLP Stop Time (ACUTE ONLY): 1155 SLP Time Calculation (min) (ACUTE ONLY): 20 min Past Medical History: Past Medical History: Diagnosis Date . Axillary adenopathy   right axillary adenopathy noted on CT chest (03/06/2012) . Chronic right-sided CHF (congestive heart failure) 03/23/2012  with cor pulmonale. Last RHC 03/2012 . COPD (chronic obstructive pulmonary disease) (HCC)   PFTS 03/08/12: fev1 0.58L/1%, FVC 1.18/33%, Ratop 49 and c/w ssevere obstruction. 21% BD response on FVC, RV 219%, DLCO 11/54% . DVT of upper extremity (deep vein thrombosis) [facility](HCC) April  2013  right subclavian // Unclear precipitating cause - possibly significant right heart failure, with vascular stasis potentially predisposing to clotting.   . Exertional shortness of breath  . Hepatic cirrhosis (HCC)   Questionable history of - Noted on CT abdomen (03/2012) - thought to be due to vascular congestion from right heart failure +/- patient's history of alcohol abuse . History of alcoholism (HCC)  . History of chronic bronchitis  . Irregular menses  . Migraine   "~ 1/yr" (05/03/2013) . Moderate to severe pulmonary hypertension April 2013  Cardiac cath on 03/06/12 - 1. Elevated pulmonary artery pressures, right sided filling pressures.,  2. PA: 64/45 (mean 53)   . On home oxygen therapy   "2-3 L 24/7" (05/03/2013) . OSA (obstructive sleep apnea)   wears noctural BiPAP (05/03/2013) . Periodontal disease  . Pneumonia ~ 1985; 01/2011 . Pulmonary embolus [facility](HCC) April 2013  Precipitating cause unclear. Was treated with coumadin from April-June 2013. Past Surgical History: Past Surgical History: Procedure Laterality Date . APPENDECTOMY  05/03/2013 . CARDIAC CATHETERIZATION  03/2012 . CARDIAC CATHETERIZATION N/A 01/08/2016  Procedure: Right Heart Cath;  Surgeon: Dolores Pattyaniel R Bensimhon, MD;  [facility];  Service: Cardiovascular;  Laterality: N/A; . CARDIAC SURGERY  10/16/1977  "my heart was backwards" (05/03/2013) . LAPAROSCOPIC APPENDECTOMY N/A 05/03/2013  Procedure: APPENDECTOMY LAPAROSCOPIC;  Surgeon: Cherylynn RidgesJames O Wyatt, MD;  [facility];  Service: General;  Laterality: N/A; . LUNG SURGERY  10/16/1977 . RIGHT HEART CATHETERIZATION N/A 03/07/2012  Procedure: RIGHT HEART CATH;  Surgeon: Kathleene Hazelhristopher D McAlhany, MD;  [facility];  Service: Cardiovascular;  Laterality: N/A; HPI: 41 year old female admitted 01/31/17 with acute on chronic respiratory failure with hypoxia. PMH significant for COPD, ETOH abuse, DVT, PE, HTN, chronic bronchitis, PNA, OSA, migraines, CHF, SOB.  Subjective: Pt seen in radiology  for MBS to objectively assess swallow function  and safety Assessment / Plan / Recommendation CHL IP CLINICAL IMPRESSIONS 02/02/2017 Clinical Impression Pt presents with normal oral, mild pharyngeal dysphagia, as well as some degree of esophageal dysmotility. Pharyngeal swallow is characterized by delayed swallow reflex on puree and solid consistencies. No penetration, aspiration, post-swallow residue, or backflow to the pharynx was noted on any consistency. The barium tablet was given with a sip of water, and while it cleared the oropharynx without difficulty or delay, esophageal sweep revealed it paused in the mid-thoracic region. Several minutes and multiple boluses of liquid were given to assist in clearing the pill. Recommend continuing with regular diet and thin liquids, following precautions for management of esophageal dysmotility (which were provided and reviewed with pt). Also recommend taking meds one at a time, and following large pills with additional boluses of water and/or allowing extra time between pills.  SLP Visit Diagnosis Dysphagia, pharyngoesophageal phase (R13.14) Impact on safety and function Mild aspiration risk   CHL IP TREATMENT RECOMMENDATION 02/02/2017 Treatment Recommendations No treatment recommended at this time. Education completed in radiology immediately following this study.   Prognosis 02/02/2017 Prognosis for Safe Diet Advancement Good CHL IP DIET RECOMMENDATION 02/02/2017 SLP Diet Recommendations Regular solids;Thin liquid Liquid Administration via Cup;Straw Medication Administration Whole meds with puree one at a time, following large pills with several boluses of water. Compensations Minimize environmental distractions;Slow rate;Small sips/bites;Follow solids with liquid Postural Changes Seated upright at 90 degrees; Remain semi-upright after after feeds/meals    CHL IP OTHER RECOMMENDATIONS 02/02/2017 Oral Care Recommendations Oral care BID    CHL IP ORAL PHASE 02/02/2017 Oral  Phase WFL  CHL IP PHARYNGEAL PHASE 02/02/2017 Pharyngeal Phase Impaired Pharyngeal- Thin Cup WFL Pharyngeal- Thin Straw WFL Pharyngeal- Puree Delayed swallow initiation-vallecula Pharyngeal- Regular Delayed swallow initiation-vallecula Pharyngeal- Pill WFL  CHL IP CERVICAL ESOPHAGEAL PHASE 02/02/2017 Cervical Esophageal Phase Impaired Pill Barium tablet cleared the oropharynx without delay or difficulty. Upon esophageal sweep, pill was noted to be stopped at the mid-thoracic region. Several minutes and multiple boluses of thin liquid were required to clear barium tablet. Celia B. [place], [facility], CCC-SLP [phone] Leigh Aurora 02/02/2017, 12:52 PM              Assessment: AECOPD, clinically improved.  Mild pulmonary congestion/CHFpEF exacerbation, improved.  Acute on Chronic Hypoxemic resp failure 2/2 COPD + OSA + OHS Pulm HTN Morbid obesity    Plan: Pt wants to go home.  She walked on 3L o2 and her sats stayed in the 90s for the most part.  Cont with o2  2.5 L 24/7, 2.5-3L O2 with exertion.  Keep o2 sats > 88% Will need a slow pred  taper again.  She just finished a pred taper before she was admitted. Suggest d/c on pred 20 mg/day for 1 week then 10 mg/day for 1 week then 5 mg/d which is her home dose.  D/C on Breo and Spiriva. (Home meds) Cont with BiPaP and o2 at HS and prn.  If for d/c today, she needs a f/u with Pulm next week.     Family  No family at bedside. PCCM will sign off.  Call back if with issues.    Pollie Meyer, MD 02/04/2017, 10:56 AM [place] [facility] Pager [phone] After 3 pm or if no answer, call [phone]

## 2017-02-06 ENCOUNTER — Other Ambulatory Visit: Payer: Self-pay | Admitting: Family

## 2017-02-06 DIAGNOSIS — G4733 Obstructive sleep apnea (adult) (pediatric): Secondary | ICD-10-CM | POA: Diagnosis not present

## 2017-02-06 DIAGNOSIS — I270 Primary pulmonary hypertension: Secondary | ICD-10-CM | POA: Diagnosis not present

## 2017-02-06 DIAGNOSIS — J449 Chronic obstructive pulmonary disease, unspecified: Secondary | ICD-10-CM | POA: Diagnosis not present

## 2017-02-07 NOTE — Telephone Encounter (Signed)
Routing to greg, please advise, thanks 

## 2017-02-08 DIAGNOSIS — I270 Primary pulmonary hypertension: Secondary | ICD-10-CM | POA: Diagnosis not present

## 2017-02-08 DIAGNOSIS — G4733 Obstructive sleep apnea (adult) (pediatric): Secondary | ICD-10-CM | POA: Diagnosis not present

## 2017-02-08 DIAGNOSIS — J449 Chronic obstructive pulmonary disease, unspecified: Secondary | ICD-10-CM | POA: Diagnosis not present

## 2017-02-08 NOTE — Telephone Encounter (Signed)
Faxed

## 2017-02-09 ENCOUNTER — Emergency Department (HOSPITAL_COMMUNITY): Payer: 59

## 2017-02-09 ENCOUNTER — Encounter (HOSPITAL_COMMUNITY): Payer: Self-pay

## 2017-02-09 ENCOUNTER — Inpatient Hospital Stay (HOSPITAL_COMMUNITY)
Admission: EM | Admit: 2017-02-09 | Discharge: 2017-02-11 | DRG: 193 | Disposition: A | Discharge status: Home / Self Care | Payer: 59 | Attending: Internal Medicine | Admitting: Internal Medicine

## 2017-02-09 DIAGNOSIS — R0600 Dyspnea, unspecified: Secondary | ICD-10-CM | POA: Diagnosis not present

## 2017-02-09 DIAGNOSIS — Z86718 Personal history of other venous thrombosis and embolism: Secondary | ICD-10-CM

## 2017-02-09 DIAGNOSIS — Z6839 Body mass index (BMI) 39.0-39.9, adult: Secondary | ICD-10-CM

## 2017-02-09 DIAGNOSIS — G4733 Obstructive sleep apnea (adult) (pediatric): Secondary | ICD-10-CM | POA: Diagnosis present

## 2017-02-09 DIAGNOSIS — E876 Hypokalemia: Secondary | ICD-10-CM | POA: Diagnosis not present

## 2017-02-09 DIAGNOSIS — E1169 Type 2 diabetes mellitus with other specified complication: Secondary | ICD-10-CM

## 2017-02-09 DIAGNOSIS — J440 Chronic obstructive pulmonary disease with acute lower respiratory infection: Secondary | ICD-10-CM | POA: Diagnosis present

## 2017-02-09 DIAGNOSIS — J9622 Acute and chronic respiratory failure with hypercapnia: Secondary | ICD-10-CM | POA: Diagnosis not present

## 2017-02-09 DIAGNOSIS — Z86711 Personal history of pulmonary embolism: Secondary | ICD-10-CM

## 2017-02-09 DIAGNOSIS — T380X5A Adverse effect of glucocorticoids and synthetic analogues, initial encounter: Secondary | ICD-10-CM | POA: Diagnosis present

## 2017-02-09 DIAGNOSIS — J189 Pneumonia, unspecified organism: Secondary | ICD-10-CM | POA: Diagnosis present

## 2017-02-09 DIAGNOSIS — J441 Chronic obstructive pulmonary disease with (acute) exacerbation: Secondary | ICD-10-CM | POA: Diagnosis present

## 2017-02-09 DIAGNOSIS — K7460 Unspecified cirrhosis of liver: Secondary | ICD-10-CM | POA: Diagnosis present

## 2017-02-09 DIAGNOSIS — I110 Hypertensive heart disease with heart failure: Secondary | ICD-10-CM | POA: Diagnosis present

## 2017-02-09 DIAGNOSIS — Z8249 Family history of ischemic heart disease and other diseases of the circulatory system: Secondary | ICD-10-CM | POA: Diagnosis not present

## 2017-02-09 DIAGNOSIS — I5032 Chronic diastolic (congestive) heart failure: Secondary | ICD-10-CM | POA: Diagnosis not present

## 2017-02-09 DIAGNOSIS — J9621 Acute and chronic respiratory failure with hypoxia: Secondary | ICD-10-CM | POA: Diagnosis not present

## 2017-02-09 DIAGNOSIS — Z7952 Long term (current) use of systemic steroids: Secondary | ICD-10-CM

## 2017-02-09 DIAGNOSIS — Z7951 Long term (current) use of inhaled steroids: Secondary | ICD-10-CM | POA: Diagnosis not present

## 2017-02-09 DIAGNOSIS — Z79899 Other long term (current) drug therapy: Secondary | ICD-10-CM | POA: Diagnosis not present

## 2017-02-09 DIAGNOSIS — E1165 Type 2 diabetes mellitus with hyperglycemia: Secondary | ICD-10-CM | POA: Diagnosis present

## 2017-02-09 DIAGNOSIS — R000 Tachycardia, unspecified: Secondary | ICD-10-CM | POA: Diagnosis not present

## 2017-02-09 DIAGNOSIS — Z87891 Personal history of nicotine dependence: Secondary | ICD-10-CM

## 2017-02-09 DIAGNOSIS — E1151 Type 2 diabetes mellitus with diabetic peripheral angiopathy without gangrene: Secondary | ICD-10-CM | POA: Diagnosis present

## 2017-02-09 DIAGNOSIS — Z9981 Dependence on supplemental oxygen: Secondary | ICD-10-CM | POA: Diagnosis not present

## 2017-02-09 DIAGNOSIS — Z811 Family history of alcohol abuse and dependence: Secondary | ICD-10-CM | POA: Diagnosis not present

## 2017-02-09 DIAGNOSIS — Y95 Nosocomial condition: Secondary | ICD-10-CM | POA: Diagnosis present

## 2017-02-09 DIAGNOSIS — E669 Obesity, unspecified: Secondary | ICD-10-CM

## 2017-02-09 DIAGNOSIS — E662 Morbid (severe) obesity with alveolar hypoventilation: Secondary | ICD-10-CM | POA: Diagnosis present

## 2017-02-09 DIAGNOSIS — Z7984 Long term (current) use of oral hypoglycemic drugs: Secondary | ICD-10-CM

## 2017-02-09 DIAGNOSIS — K219 Gastro-esophageal reflux disease without esophagitis: Secondary | ICD-10-CM

## 2017-02-09 LAB — BASIC METABOLIC PANEL
Anion gap: 10 (ref 5–15)
BUN: 13 mg/dL (ref 6–20)
CALCIUM: 9.3 mg/dL (ref 8.9–10.3)
CHLORIDE: 85 mmol/L — AB (ref 101–111)
CO2: 42 mmol/L — ABNORMAL HIGH (ref 22–32)
CREATININE: 0.67 mg/dL (ref 0.44–1.00)
GFR calc non Af Amer: >60 mL/min (ref >60)
Glucose, Bld: 210 mg/dL — ABNORMAL HIGH (ref 65–99)
Potassium: 3.4 mmol/L — ABNORMAL LOW (ref 3.5–5.1)
SODIUM: 137 mmol/L (ref 135–145)

## 2017-02-09 LAB — CBC
HCT: 46.6 % — ABNORMAL HIGH (ref 36.0–46.0)
Hemoglobin: 14.6 g/dL (ref 12.0–15.0)
MCH: 31.7 pg (ref 26.0–34.0)
MCHC: 31.3 g/dL (ref 30.0–36.0)
MCV: 101.3 fL — AB (ref 78.0–100.0)
PLATELETS: 169 10*3/uL (ref 150–400)
RBC: 4.6 MIL/uL (ref 3.87–5.11)
RDW: 13.4 % (ref 11.5–15.5)
WBC: 14 10*3/uL — ABNORMAL HIGH (ref 4.0–10.5)

## 2017-02-09 LAB — BLOOD GAS, VENOUS
Acid-Base Excess: 13.1 mmol/L — ABNORMAL HIGH (ref 0.0–2.0)
BICARBONATE: 43.1 mmol/L — AB (ref 20.0–28.0)
Drawn by: 235321
O2 Content: 4 L/min
O2 Saturation: 54.5 %
PH VEN: 7.314 (ref 7.250–7.430)
Patient temperature: 98.6
pCO2, Ven: 87.2 mmHg (ref 44.0–60.0)

## 2017-02-09 LAB — GLUCOSE, CAPILLARY
GLUCOSE-CAPILLARY: 287 mg/dL — AB (ref 65–99)
Glucose-Capillary: 359 mg/dL — ABNORMAL HIGH (ref 65–99)
Glucose-Capillary: 288 mg/dL — ABNORMAL HIGH (ref 65–99)

## 2017-02-09 LAB — TROPONIN I: Troponin I: <0.03 ng/mL (ref <0.03)

## 2017-02-09 LAB — MRSA PCR SCREENING: MRSA by PCR: NEGATIVE

## 2017-02-09 LAB — LACTIC ACID, PLASMA: LACTIC ACID, VENOUS: 1.8 mmol/L (ref 0.5–1.9)

## 2017-02-09 LAB — BRAIN NATRIURETIC PEPTIDE: B NATRIURETIC PEPTIDE 5: 104.2 pg/mL — AB (ref 0.0–100.0)

## 2017-02-09 LAB — PROTIME-INR
INR: 0.92
PROTHROMBIN TIME: 12.3 s (ref 11.4–15.2)

## 2017-02-09 LAB — PROCALCITONIN: PROCALCITONIN: 0.26 ng/mL

## 2017-02-09 LAB — APTT: aPTT: 29 seconds (ref 24–36)

## 2017-02-09 MED ORDER — INSULIN GLARGINE 100 UNIT/ML ~~LOC~~ SOLN
10.0000 [IU] | Freq: Every day | SUBCUTANEOUS | Status: DC
  Administered 2017-02-09 – 2017-02-10 (×2): 10 [IU] via SUBCUTANEOUS
  Filled 2017-02-09 (×2): qty 0.1

## 2017-02-09 MED ORDER — LEVALBUTEROL HCL 1.25 MG/0.5ML IN NEBU: 1.2500 mg | INHALATION_SOLUTION | Freq: Four times a day (QID) | RESPIRATORY_TRACT | Status: DC

## 2017-02-09 MED ORDER — POTASSIUM CHLORIDE CRYS ER 20 MEQ PO TBCR
20.0000 meq | EXTENDED_RELEASE_TABLET | Freq: Every day | ORAL | Status: DC
  Administered 2017-02-10 – 2017-02-11 (×2): 20 meq via ORAL
  Filled 2017-02-09 (×2): qty 1

## 2017-02-09 MED ORDER — ENOXAPARIN SODIUM 40 MG/0.4ML ~~LOC~~ SOLN
40.0000 mg | SUBCUTANEOUS | Status: DC
  Administered 2017-02-09 – 2017-02-11 (×3): 40 mg via SUBCUTANEOUS
  Filled 2017-02-09 (×3): qty 0.4

## 2017-02-09 MED ORDER — TRAMADOL HCL 50 MG PO TABS
50.0000 mg | ORAL_TABLET | Freq: Four times a day (QID) | ORAL | Status: DC | PRN
Start: 2017-02-09 — End: 2017-02-11
  Administered 2017-02-09 – 2017-02-11 (×5): 50 mg via ORAL
  Filled 2017-02-09 (×6): qty 1

## 2017-02-09 MED ORDER — ACETAMINOPHEN 325 MG PO TABS: 650.0000 mg | ORAL_TABLET | Freq: Four times a day (QID) | ORAL | Status: DC | PRN

## 2017-02-09 MED ORDER — SODIUM CHLORIDE 0.9% FLUSH
3.0000 mL | Freq: Two times a day (BID) | INTRAVENOUS | Status: DC
  Administered 2017-02-09 – 2017-02-10 (×3): 3 mL via INTRAVENOUS

## 2017-02-09 MED ORDER — PIPERACILLIN-TAZOBACTAM 3.375 G IVPB
3.3750 g | Freq: Three times a day (TID) | INTRAVENOUS | Status: DC
  Administered 2017-02-09 – 2017-02-11 (×7): 3.375 g via INTRAVENOUS
  Filled 2017-02-09 (×9): qty 50

## 2017-02-09 MED ORDER — SODIUM CHLORIDE 0.9% FLUSH
3.0000 mL | Freq: Two times a day (BID) | INTRAVENOUS | Status: DC
  Administered 2017-02-09 – 2017-02-10 (×4): 3 mL via INTRAVENOUS

## 2017-02-09 MED ORDER — IPRATROPIUM-ALBUTEROL 0.5-2.5 (3) MG/3ML IN SOLN
3.0000 mL | Freq: Four times a day (QID) | RESPIRATORY_TRACT | Status: DC
  Administered 2017-02-09 – 2017-02-11 (×11): 3 mL via RESPIRATORY_TRACT
  Filled 2017-02-09 (×10): qty 3

## 2017-02-09 MED ORDER — TORSEMIDE 20 MG PO TABS
20.0000 mg | ORAL_TABLET | Freq: Every day | ORAL | Status: DC
  Administered 2017-02-09 – 2017-02-11 (×3): 20 mg via ORAL
  Filled 2017-02-09 (×3): qty 1

## 2017-02-09 MED ORDER — ROFLUMILAST 500 MCG PO TABS
500.0000 ug | ORAL_TABLET | Freq: Every day | ORAL | Status: DC
  Administered 2017-02-09 – 2017-02-11 (×3): 500 ug via ORAL
  Filled 2017-02-09 (×3): qty 1

## 2017-02-09 MED ORDER — POTASSIUM CHLORIDE CRYS ER 20 MEQ PO TBCR: 20.0000 meq | EXTENDED_RELEASE_TABLET | Freq: Every day | ORAL | Status: DC

## 2017-02-09 MED ORDER — SODIUM CHLORIDE 0.9 % IV SOLN: 250.0000 mL | INTRAVENOUS | Status: DC | PRN

## 2017-02-09 MED ORDER — ACETAMINOPHEN 325 MG PO TABS
650.0000 mg | ORAL_TABLET | Freq: Once | ORAL | Status: AC
  Administered 2017-02-09: 650 mg via ORAL
  Filled 2017-02-09: qty 2

## 2017-02-09 MED ORDER — ACETAMINOPHEN 650 MG RE SUPP: 650.0000 mg | Freq: Four times a day (QID) | RECTAL | Status: DC | PRN

## 2017-02-09 MED ORDER — PANTOPRAZOLE SODIUM 40 MG PO TBEC
40.0000 mg | DELAYED_RELEASE_TABLET | Freq: Every day | ORAL | Status: DC
Start: 2017-02-09 — End: 2017-02-11
  Administered 2017-02-09 – 2017-02-11 (×3): 40 mg via ORAL
  Filled 2017-02-09 (×3): qty 1

## 2017-02-09 MED ORDER — METHYLPREDNISOLONE SODIUM SUCC 125 MG IJ SOLR
125.0000 mg | Freq: Once | INTRAMUSCULAR | Status: AC
  Administered 2017-02-09: 125 mg via INTRAVENOUS
  Filled 2017-02-09: qty 2

## 2017-02-09 MED ORDER — FLUTICASONE FUROATE-VILANTEROL 100-25 MCG/INH IN AEPB
1.0000 | INHALATION_SPRAY | Freq: Every day | RESPIRATORY_TRACT | Status: DC
  Administered 2017-02-10 – 2017-02-11 (×2): 1 via RESPIRATORY_TRACT
  Filled 2017-02-09: qty 28

## 2017-02-09 MED ORDER — PIPERACILLIN-TAZOBACTAM 3.375 G IVPB
3.3750 g | Freq: Once | INTRAVENOUS | Status: AC
  Administered 2017-02-09: 3.375 g via INTRAVENOUS
  Filled 2017-02-09: qty 50

## 2017-02-09 MED ORDER — ALBUTEROL SULFATE (2.5 MG/3ML) 0.083% IN NEBU: 5.0000 mg | INHALATION_SOLUTION | Freq: Once | RESPIRATORY_TRACT | Status: DC

## 2017-02-09 MED ORDER — VANCOMYCIN HCL IN DEXTROSE 1-5 GM/200ML-% IV SOLN
1000.0000 mg | Freq: Once | INTRAVENOUS | Status: AC
  Administered 2017-02-09: 1000 mg via INTRAVENOUS
  Filled 2017-02-09: qty 200

## 2017-02-09 MED ORDER — METHYLPREDNISOLONE SODIUM SUCC 125 MG IJ SOLR
60.0000 mg | Freq: Three times a day (TID) | INTRAMUSCULAR | Status: DC
  Administered 2017-02-09 – 2017-02-11 (×7): 60 mg via INTRAVENOUS
  Filled 2017-02-09 (×7): qty 2

## 2017-02-09 MED ORDER — IPRATROPIUM-ALBUTEROL 0.5-2.5 (3) MG/3ML IN SOLN: 3.0000 mL | RESPIRATORY_TRACT | Status: DC | PRN

## 2017-02-09 MED ORDER — ALBUTEROL SULFATE (2.5 MG/3ML) 0.083% IN NEBU: 2.5000 mg | INHALATION_SOLUTION | RESPIRATORY_TRACT | Status: DC | PRN

## 2017-02-09 MED ORDER — ALBUTEROL (5 MG/ML) CONTINUOUS INHALATION SOLN
10.0000 mg/h | INHALATION_SOLUTION | Freq: Once | RESPIRATORY_TRACT | Status: AC
  Administered 2017-02-09: 10 mg/h via RESPIRATORY_TRACT
  Filled 2017-02-09: qty 20

## 2017-02-09 MED ORDER — INSULIN ASPART 100 UNIT/ML ~~LOC~~ SOLN
0.0000 [IU] | Freq: Three times a day (TID) | SUBCUTANEOUS | Status: DC
  Administered 2017-02-09: 8 [IU] via SUBCUTANEOUS
  Administered 2017-02-09: 15 [IU] via SUBCUTANEOUS
  Administered 2017-02-10: 11 [IU] via SUBCUTANEOUS
  Administered 2017-02-10 (×2): 15 [IU] via SUBCUTANEOUS
  Administered 2017-02-11 (×2): 11 [IU] via SUBCUTANEOUS

## 2017-02-09 MED ORDER — IPRATROPIUM BROMIDE 0.02 % IN SOLN: 0.5000 mg | RESPIRATORY_TRACT | Status: DC

## 2017-02-09 MED ORDER — INSULIN ASPART 100 UNIT/ML ~~LOC~~ SOLN
0.0000 [IU] | Freq: Every day | SUBCUTANEOUS | Status: DC
  Administered 2017-02-09: 3 [IU] via SUBCUTANEOUS
  Administered 2017-02-10: 5 [IU] via SUBCUTANEOUS

## 2017-02-09 MED ORDER — PREGABALIN 50 MG PO CAPS
50.0000 mg | ORAL_CAPSULE | Freq: Three times a day (TID) | ORAL | Status: DC
  Administered 2017-02-09 – 2017-02-11 (×7): 50 mg via ORAL
  Filled 2017-02-09 (×7): qty 1

## 2017-02-09 MED ORDER — GUAIFENESIN ER 600 MG PO TB12
600.0000 mg | ORAL_TABLET | Freq: Two times a day (BID) | ORAL | Status: DC
  Administered 2017-02-09 – 2017-02-11 (×5): 600 mg via ORAL
  Filled 2017-02-09 (×5): qty 1

## 2017-02-09 MED ORDER — VANCOMYCIN HCL IN DEXTROSE 1-5 GM/200ML-% IV SOLN
1000.0000 mg | Freq: Three times a day (TID) | INTRAVENOUS | Status: DC
  Administered 2017-02-09 – 2017-02-10 (×3): 1000 mg via INTRAVENOUS
  Filled 2017-02-09 (×3): qty 200

## 2017-02-09 MED ORDER — POTASSIUM CHLORIDE CRYS ER 20 MEQ PO TBCR
40.0000 meq | EXTENDED_RELEASE_TABLET | Freq: Once | ORAL | Status: AC
  Administered 2017-02-09: 40 meq via ORAL
  Filled 2017-02-09: qty 2

## 2017-02-09 MED ORDER — SODIUM CHLORIDE 0.9% FLUSH: 3.0000 mL | INTRAVENOUS | Status: DC | PRN

## 2017-02-09 MED ORDER — GABAPENTIN 300 MG PO CAPS
600.0000 mg | ORAL_CAPSULE | Freq: Three times a day (TID) | ORAL | Status: DC
  Administered 2017-02-09 – 2017-02-11 (×7): 600 mg via ORAL
  Filled 2017-02-09 (×7): qty 2

## 2017-02-09 NOTE — H&P (Signed)
History and Physical    Mary [name] ZOX:[id number] DOB: 11/22/1976 DOA: 02/09/2017  PCP: Jeanine Luzalone, Gregory, FNP   I have briefly reviewed patients [facility].  Patient coming from: Home  Chief Complaint: Worsening dyspnea  HPI: Mary [name] is a 41 year old female with PMH of severe COPD (Dr. Marchelle Gearingamaswamy), oxygen dependent (2.5-3 L/m), steroid dependent (prednisone 5 mg daily), former smoker, Alcohol use, remote PE and completed Coumadin anticoagulation, type II DM, chronic right-sided/diastolic CHF, OSA on nightly BiPAP, multiple hospitalizations (5 in the last 6 months), recent hospitalization 01/31/17-02/04/17 presented to [facility] ED with worsening dyspnea. She states that she was in her usual state of health until the night prior to admission when she developed subacute onset of dyspnea, wheezing, chest tightness, dry cough without fever, chills or chest pain. Her pulse oximetry would not record oxygen saturations initially. She used her rescue inhaler followed by nebulization with some improvement in dyspnea. Her oxygen saturation improved from 39-59 after nebulization. By then EMS arrived and placed her on a nonrebreather mask and her oxygen saturations went up to the 90s and she started feeling better. No new sick contacts. In the ED and received treatment for COPD exacerbation and presumed pneumonia. States that she's starting to feel better and her dyspnea has improved by 50%.  ED Course: Mild sinus tachycardia on telemetry, WBC 14, hemoglobin 14.6, potassium 3.4, glucose 210, chest x-ray suggestive of patchy airspace opacity in left base concerning for pneumonia.  Review of Systems:  All other systems reviewed and apart from HPI, are negative.  Past Medical History:  Diagnosis Date  . Axillary adenopathy    right axillary adenopathy noted on CT chest (03/06/2012)  . Chronic right-sided CHF (congestive heart failure) 03/23/2012   with cor pulmonale. Last RHC 03/2012  . COPD (chronic obstructive pulmonary disease) (HCC)    PFTS 03/08/12: fev1 0.58L/1%, FVC 1.18/33%, Ratop 49 and c/w ssevere obstruction. 21% BD response on FVC, RV 219%, DLCO 11/54%  . DVT of upper extremity (deep vein thrombosis) [facility](HCC) April 2013   right subclavian // Unclear precipitating cause - possibly significant right heart failure, with vascular stasis potentially predisposing to clotting.    . Exertional shortness of breath   . Hepatic cirrhosis (HCC)    Questionable history of - Noted on CT abdomen (03/2012) - thought to be due to vascular congestion from right heart failure +/- patient's history of alcohol abuse  . History of alcoholism (HCC)   . History of chronic bronchitis   . Irregular menses   . Migraine    "~ 1/yr" (05/03/2013)  . Moderate to severe pulmonary hypertension April 2013   Cardiac cath on 03/06/12 - 1. Elevated pulmonary artery pressures, right sided filling pressures.,  2. PA: 64/45 (mean 53)    . On home oxygen therapy    "2-3 L 24/7" (05/03/2013)  . OSA (obstructive sleep apnea)    wears noctural BiPAP (05/03/2013)  . Periodontal disease   . Pneumonia ~ 1985; 01/2011  . Pulmonary embolus [facility](HCC) April 2013   Precipitating cause unclear. Was treated with coumadin from April-June 2013.    Past Surgical History:  Procedure Laterality Date  . APPENDECTOMY  05/03/2013  . CARDIAC CATHETERIZATION  03/2012  . CARDIAC CATHETERIZATION N/A 01/08/2016   Procedure: Right Heart Cath;  Surgeon: Dolores Pattyaniel R Bensimhon, MD;  [facility];  Service: Cardiovascular;  Laterality: N/A;  . CARDIAC SURGERY  10/16/1977   "my heart  was backwards" (05/03/2013)  . LAPAROSCOPIC APPENDECTOMY N/A 05/03/2013   Procedure: APPENDECTOMY LAPAROSCOPIC;  Surgeon: Cherylynn Ridges, MD;  Location: [facility];  Service: General;  Laterality: N/A;  . LUNG SURGERY  06/17/1977  . RIGHT HEART CATHETERIZATION N/A 03/07/2012   Procedure: RIGHT HEART CATH;  Surgeon:  Kathleene Hazel, MD;  Location: [facility] CATH LAB;  Service: Cardiovascular;  Laterality: N/A;   Social history  reports that she quit smoking about 4 years ago. Her smoking use included Cigarettes. She has a 25.00 pack-year smoking history. She has never used smokeless tobacco. She reports that she drinks about 1.8 oz of alcohol per week . She reports that she does not use drugs.  No Known Allergies  Family History  Problem Relation Age of Onset  . Hypertension Father   . Heart disease Father     CHF; died age 30   . Alcohol abuse Father   . Other Brother     died age 104 y.o overdose     Prior to Admission medications   Medication Sig Start Date End Date Taking? Authorizing Provider  albuterol (PROVENTIL HFA;VENTOLIN HFA) 108 (90 BASE) MCG/ACT inhaler Inhale 2 puffs into the lungs every 6 (six) hours as needed for wheezing or shortness of breath. 09/06/14  Yes Kalman Shan, MD  BREO ELLIPTA 100-25 MCG/INH AEPB INHALE 1 PUFF INTO THE LUNGS DAILY. 06/03/16  Yes Kalman Shan, MD  gabapentin (NEURONTIN) 300 MG capsule TAKE 2 CAPSULES BY MOUTH 3 TIMES DAILY. 12/14/16  Yes Veryl Speak, FNP  glipiZIDE (GLUCOTROL) 5 MG tablet Take 1 tablet (5 mg total) by mouth daily before breakfast. For one week ONLY while on high-dose prednisone 02/04/17  Yes Courage Emokpae, MD  LYRICA 50 MG capsule TAKE 1 CAPSULE BY MOUTH 3 TIMES DAILY 02/07/17  Yes Veryl Speak, FNP  pantoprazole (PROTONIX) 40 MG tablet Take 1 tablet (40 mg total) by mouth 2 (two) times daily before a meal. Patient taking differently: Take 40 mg by mouth daily.  07/22/16  Yes Jeralyn Bennett, MD  potassium chloride SA (K-DUR,KLOR-CON) 20 MEQ tablet Take 1 tablet (20 mEq total) by mouth daily. 01/29/16  Yes Dolores Patty, MD  predniSONE (DELTASONE) 5 MG tablet Take  20 mg (4 Tab) qam with breakfast 1 week, then take 10 mg (2 tab) qam with breakfast 1 week, then 5 mg daily indefinitely 02/04/17  Yes Courage Emokpae, MD    roflumilast (DALIRESP) 500 MCG TABS tablet Take 500 mcg by mouth daily.   Yes Historical Provider, MD  SPIRIVA RESPIMAT 1.25 MCG/ACT AERS INHALE 2 PUFFS INTO THE LUNGS 2 (TWO) TIMES DAILY. Patient taking differently: Inhale 2 puffs into the lungs daily.  08/16/16  Yes Kalman Shan, MD  torsemide (DEMADEX) 20 MG tablet Take 1 tablet (20 mg total) by mouth daily. 12/03/16  Yes Kalman Shan, MD  traMADol (ULTRAM) 50 MG tablet TAKE 1 TABLET BY MOUTH EVERY 6 HOURS AS NEEDED FOR SEVERE PAIN 01/18/17  Yes Veryl Speak, FNP    Physical Exam: Vitals:   02/09/17 0500 02/09/17 0544 02/09/17 0621 02/09/17 0659  BP: 116/67 119/72 110/68 110/68  Pulse: (!) 121 (!) 125 (!) 123 120  Resp: 20 18 17 17   Temp:      TempSrc:      SpO2: 93% 96% 90% 91%  Weight:      Height:          Constitutional: Pleasant young female, moderately built and obese, lying comfortably propped up  in the gurney in the ED and in no distress. Eyes: PERTLA, lids and conjunctivae normal ENMT: Mucous membranes are moist. Posterior pharynx clear of any exudate or lesions. Normal dentition.  Neck: supple, no masses, no thyromegaly Respiratory: Reduced breath sounds bilaterally with scattered few bilateral medium pitched expiratory rhonchi. Occasional basal crackles. No increased work of breathing or use of accessory muscles. On BiPAP. Cardiovascular: S1 & S2 heard, regular rate and rhythm, no murmurs / rubs / gallops. Trace ankle edema. 2+ pedal pulses. No carotid bruits. Telemetry: Sinus tachycardia in the 110s. Abdomen: No distension, no tenderness, no masses palpated. No hepatosplenomegaly. Bowel sounds normal.  Musculoskeletal: no clubbing / cyanosis. No joint deformity upper and lower extremities. Good ROM, no contractures. Normal muscle tone.  Skin: no rashes, lesions, ulcers. No induration Neurologic: CN 2-12 grossly intact. Sensation intact, DTR normal. Strength 5/5 in all 4 limbs.  Psychiatric: Normal judgment  and insight. Alert and oriented x 3. Normal mood.     Labs on Admission: I have personally reviewed following labs and imaging studies  CBC:  Recent Labs Lab 02/03/17 0532 02/04/17 0654 02/09/17 0340  WBC 12.1* 13.7* 14.0*  HGB 13.0 14.2 14.6  HCT 40.9 44.7 46.6*  MCV 101.0* 100.9* 101.3*  PLT 123* 152 169   Basic Metabolic Panel:  Recent Labs Lab 02/03/17 0532 02/09/17 0340  NA 136 137  K 3.9 3.4*  CL 87* 85*  CO2 40* 42*  GLUCOSE 156* 210*  BUN 17 13  CREATININE 0.55 0.67  CALCIUM 9.2 9.3   Coagulation Profile:  Recent Labs Lab 02/09/17 0340  INR 0.92   Cardiac Enzymes:  Recent Labs Lab 02/09/17 0340  TROPONINI <0.03   CBG:  Recent Labs Lab 02/03/17 1133 02/03/17 1716 02/03/17 2056 02/04/17 0739 02/04/17 1140  GLUCAP 331* 334* 212* 191* 242*   Urine analysis:    Component Value Date/Time   COLORURINE STRAW (A) 01/31/2017 2144   APPEARANCEUR CLEAR 01/31/2017 2144   LABSPEC 1.015 01/31/2017 2144   PHURINE 5.0 01/31/2017 2144   GLUCOSEU >=500 (A) 01/31/2017 2144   HGBUR NEGATIVE 01/31/2017 2144   BILIRUBINUR NEGATIVE 01/31/2017 2144   KETONESUR NEGATIVE 01/31/2017 2144   PROTEINUR NEGATIVE 01/31/2017 2144   UROBILINOGEN 1.0 07/25/2015 1400   NITRITE NEGATIVE 01/31/2017 2144   LEUKOCYTESUR NEGATIVE 01/31/2017 2144     Radiological Exams on Admission: Dg Chest Port 1 View  Result Date: 02/09/2017 CLINICAL DATA:  Sudden onset dyspnea. EXAM: PORTABLE CHEST 1 VIEW COMPARISON:  02/01/2017, 01/31/2017. FINDINGS: Patchy airspace opacity in the left lung base could represent pneumonia. Right lung is clear. No large effusions. Pulmonary vasculature is normal. IMPRESSION: Patchy airspace opacity in the left base. Cannot exclude pneumonia. Followup PA and lateral chest X-ray is recommended in 3-4 weeks following trial of antibiotic therapy to ensure resolution and exclude underlying malignancy. Electronically Signed   By: Ellery Plunk M.D.   On:  02/09/2017 04:30    EKG: Independently reviewed. Sinus tachycardia at 1 29 bpm, normal axis, possible biatrial enlargement,? RBBB, Q waves in leads 1 and aVL and QTC 468 ms.  Assessment/Plan Principal Problem:   COPD exacerbation (HCC) Active Problems:   OSA on BiPAP   Hypokalemia   Esophageal reflux   Morbid obesity (HCC)   Chronic diastolic heart failure (HCC)   Diabetes mellitus type 2 in obese (HCC)   Acute on chronic respiratory failure with hypoxia and hypercapnia (HCC)   HCAP (healthcare-associated pneumonia)     1. COPD exacerbation: May  have been precipitated by possible pneumonia. Admit to stepdown unit. Continue broad-spectrum IV antibiotics (vancomycin & Zosyn), IV Solu-Medrol, bronchodilator nebulizations. PCCM consulted. COPD Gold protocol initiated. 2. Possible left base HCAP: Recent HIV antibody testing negative. Continue IV vancomycin and Zosyn. Check pro calcitonin and if low, may consider stopping antibiotics. 3. Acute on chronic hypoxic and hypercapnic respiratory failure: Precipitated by COPD exacerbation complicating underlying severe COPD and OSA. ABG: PH 7.314, PCO2 87, PO2 not reported, bicarbonate 43.1 and oxygen saturation 54.5 on 4 L oxygen via nasal cannula. Continue BiPAP when necessary and at bedtime, titrate oxygen to maintain saturations between 89-92 percent. Treat underlying cause as above. 4. OSA on BiPAP: Continue BiPAP. 5. Hypokalemia: Replace and follow. 6. Chronic right heart/diastolic CHF: Not overtly volume overloaded. Continue home dose of torsemide. 7. Uncontrolled type II DM: A1c on 2/27:6.3. Now control will be difficult due to IV steroids. NovoLog SSI. Add Lantus 10 units daily. 8. GERD: PPI 9. Alcohol use: Patient denies significant alcohol intake and she says that she does this occasionally. Did volunteer to drinking 2 mixed drinks a night prior to admission.   DVT prophylaxis: Lovenox  Code Status: Full  Family Communication:  Discussed in detail with patient's spouse at bedside  Disposition Plan: Admitted to stepdown unit. DC home when medically stable, possibly in 3-4 days.  Consults called: PCCM  Admission status: Inpatient, stepdown unit.    [facility] MD [facility] Pager [phone]  If 7PM-7AM, please contact night-coverage [email or web address] Password [email or web address]  02/09/2017, 8:00 AM

## 2017-02-09 NOTE — ED Provider Notes (Signed)
[facility]-EMERGENCY DEPT Provider Note   CSN: [id number] Arrival date & time: 02/09/17  0331  Time seen 03:48 AM   History   Chief Complaint Chief Complaint  Patient presents with  . Shortness of Breath    HPI Mary [name] is a 41 y.o. female.  HPI  patient has a history of end-stage COPD on oxygen at home at 3 L/m nasal cannula. She was just discharged from the hospital on March 2. She states she was doing well until she is acutely woke up this morning and felt like she couldn't breathe. She states she felt like her throat was closing off in her heart was beating in her ears. She states she tried her pursed lip breathing and use her inhaler and nebulizer without results. She states when she checked her pulse ox at home it was 32% and then went into the 40s. On EMS arrival she states it was 59%. She states EMS gave her 3 nebulizer treatments and put her on a nonrebreather mask which she states really helped. She states she's feeling better but she still feels tight. She's having a mild cough with sometimes clear mucus. She states she has been taking her diuretics and has no edema.  PCP Jeanine Luz, FNP   Past Medical History:  Diagnosis Date  . Axillary adenopathy    right axillary adenopathy noted on CT chest (03/06/2012)  . Chronic right-sided CHF (congestive heart failure) 03/23/2012   with cor pulmonale. Last RHC 03/2012  . COPD (chronic obstructive pulmonary disease) (HCC)    PFTS 03/08/12: fev1 0.58L/1%, FVC 1.18/33%, Ratop 49 and c/w ssevere obstruction. 21% BD response on FVC, RV 219%, DLCO 11/54%  . DVT of upper extremity (deep vein thrombosis) [facility]) April 2013   right subclavian // Unclear precipitating cause - possibly significant right heart failure, with vascular stasis potentially predisposing to clotting.    . Exertional shortness of breath   . Hepatic cirrhosis (HCC)    Questionable history of - Noted on CT abdomen (03/2012) - thought to be due to vascular congestion  from right heart failure +/- patient's history of alcohol abuse  . History of alcoholism (HCC)   . History of chronic bronchitis   . Irregular menses   . Migraine    "~ 1/yr" (05/03/2013)  . Moderate to severe pulmonary hypertension April 2013   Cardiac cath on 03/06/12 - 1. Elevated pulmonary artery pressures, right sided filling pressures.,  2. PA: 64/45 (mean 53)    . On home oxygen therapy    "2-3 L 24/7" (05/03/2013)  . OSA (obstructive sleep apnea)    wears noctural BiPAP (05/03/2013)  . Periodontal disease   . Pneumonia ~ 1985; 01/2011  . Pulmonary embolus [facility]) April 2013   Precipitating cause unclear. Was treated with coumadin from April-June 2013.    Patient Active Problem List   Diagnosis Date Noted  . Acute exacerbation of chronic obstructive pulmonary disease (COPD) (HCC)   . Acute on chronic respiratory failure with hypercapnia (HCC)   . Thrombocytopenia (HCC) 02/01/2017  . Acute on chronic diastolic CHF (congestive heart failure) (HCC) 01/31/2017  . Alcohol abuse 01/31/2017  . Respiratory failure with hypercapnia (HCC) 01/11/2017  . Acute on chronic respiratory failure with hypoxia and hypercapnia (HCC)   . COPD (chronic obstructive pulmonary disease) with chronic bronchitis (HCC)   . Tracheomalacia   . Palliative care by specialist   . Anxiety state   . Advance directive declined by patient   . Goals  of care, counseling/discussion   . Diabetes mellitus with complication (HCC)   . Chronic hoarseness 07/27/2016  . Nausea with vomiting 05/31/2016  . Diabetic neuropathy (HCC) 04/30/2016  . Diabetes mellitus type 2 in obese (HCC) 04/10/2016  . SOB (shortness of breath)   . OSA treated with BiPAP 04/02/2016  . COPD exacerbation (HCC) 04/01/2016  . Chronic diastolic heart failure (HCC) 01/25/2016  . Chronic respiratory failure with hypoxia (HCC) 01/21/2016  . Right heart failure   . PAH (pulmonary artery hypertension)   . COPD (chronic obstructive pulmonary disease)  (HCC) 12/16/2015  . Constipation 11/18/2015  . Morbid obesity (HCC) 11/18/2015  . Lung nodule 01/30/2015  . Pulmonary mass 01/13/2015  . Other emphysema (HCC)   . Gastroparesis   . Esophageal reflux   . Obstipation   . Dehydration 12/14/2014  . Abdominal pain 12/14/2014  . Fever 12/14/2014  . Dyspnea 09/18/2013  . Hypokalemia 02/28/2013  . Acute on chronic respiratory failure with hypoxia (HCC) 02/28/2013  . Financial difficulties 02/28/2013  . Obesity hypoventilation syndrome (HCC) 02/28/2013  . Elevated troponin 02/28/2013  . Myalgia 02/28/2013  . Influenza with respiratory manifestations 02/28/2013  . Anovulation 06/30/2012  . Preventative health care 05/05/2012  . Irregular menstrual cycle 04/27/2012  . Chronic right-sided CHF (congestive heart failure) 03/23/2012  . Pulmonary hypertension 03/23/2012  . Cor pulmonale (HCC) 03/21/2012  . Chronic respiratory failure with hypercapnia (HCC) 03/21/2012  . OSA on BiPAP 03/19/2012  . Axillary lymphadenopathy 03/06/2012  . Congenital heart disease 01/28/2012  . Obesity 01/28/2012  . Dental caries 01/28/2012    Past Surgical History:  Procedure Laterality Date  . APPENDECTOMY  05/03/2013  . CARDIAC CATHETERIZATION  03/2012  . CARDIAC CATHETERIZATION N/A 01/08/2016   Procedure: Right Heart Cath;  Surgeon: Dolores Patty, MD;  Location[facility] INVASIVE CV LAB;  Service: Cardiovascular;  Laterality: N/A;  . CARDIAC SURGERY  1977-10-11   "my heart was backwards" (05/03/2013)  . LAPAROSCOPIC APPENDECTOMY N/A 05/03/2013   Procedure: APPENDECTOMY LAPAROSCOPIC;  Surgeon: Cherylynn Ridges, MD;  Location: [facility] OR;  Service: General;  Laterality: N/A;  . LUNG SURGERY  1977-03-06  . RIGHT HEART CATHETERIZATION N/A 03/07/2012   Procedure: RIGHT HEART CATH;  Surgeon: Kathleene Hazel, MD;  Location: [facility] CATH LAB;  Service: Cardiovascular;  Laterality: N/A;    OB History    No data available       Home Medications    Prior to Admission  medications   Medication Sig Start Date End Date Taking? Authorizing Provider  albuterol (PROVENTIL HFA;VENTOLIN HFA) 108 (90 BASE) MCG/ACT inhaler Inhale 2 puffs into the lungs every 6 (six) hours as needed for wheezing or shortness of breath. 09/06/14  Yes Kalman Shan, MD  BREO ELLIPTA 100-25 MCG/INH AEPB INHALE 1 PUFF INTO THE LUNGS DAILY. 06/03/16  Yes Kalman Shan, MD  gabapentin (NEURONTIN) 300 MG capsule TAKE 2 CAPSULES BY MOUTH 3 TIMES DAILY. 12/14/16  Yes Veryl Speak, FNP  glipiZIDE (GLUCOTROL) 5 MG tablet Take 1 tablet (5 mg total) by mouth daily before breakfast. For one week ONLY while on high-dose prednisone 02/04/17  Yes Courage Emokpae, MD  LYRICA 50 MG capsule TAKE 1 CAPSULE BY MOUTH 3 TIMES DAILY 02/07/17  Yes Veryl Speak, FNP  pantoprazole (PROTONIX) 40 MG tablet Take 1 tablet (40 mg total) by mouth 2 (two) times daily before a meal. Patient taking differently: Take 40 mg by mouth daily.  07/22/16  Yes Jeralyn Bennett, MD  potassium chloride SA (  K-DUR,KLOR-CON) 20 MEQ tablet Take 1 tablet (20 mEq total) by mouth daily. 01/29/16  Yes Dolores Patty, MD  predniSONE (DELTASONE) 5 MG tablet Take  20 mg (4 Tab) qam with breakfast 1 week, then take 10 mg (2 tab) qam with breakfast 1 week, then 5 mg daily indefinitely 02/04/17  Yes Courage Emokpae, MD  roflumilast (DALIRESP) 500 MCG TABS tablet Take 500 mcg by mouth daily.   Yes Historical Provider, MD  SPIRIVA RESPIMAT 1.25 MCG/ACT AERS INHALE 2 PUFFS INTO THE LUNGS 2 (TWO) TIMES DAILY. Patient taking differently: Inhale 2 puffs into the lungs daily.  08/16/16  Yes Kalman Shan, MD  torsemide (DEMADEX) 20 MG tablet Take 1 tablet (20 mg total) by mouth daily. 12/03/16  Yes Kalman Shan, MD  traMADol (ULTRAM) 50 MG tablet TAKE 1 TABLET BY MOUTH EVERY 6 HOURS AS NEEDED FOR SEVERE PAIN 01/18/17  Yes Veryl Speak, FNP    Family History Family History  Problem Relation Age of Onset  . Hypertension Father   . Heart  disease Father     CHF; died age 88   . Alcohol abuse Father   . Other Brother     died age 35 y.o overdose   Social History Social History  Substance Use Topics  . Smoking status: Former Smoker    Packs/day: 1.00    Years: 25.00    Types: Cigarettes    Quit date: 02/18/2012  . Smokeless tobacco: Never Used  . Alcohol use 1.8 oz/week    3 Shots of liquor per week     Comment: previously drinking 1 pint 3-4 days a week for 2002-2013, but in 2014, occasional drinking now   lives at home Lives with spouse    Allergies   Patient has no known allergies.   Review of Systems Review of Systems  All other systems reviewed and are negative.    Physical Exam Updated Vital Signs BP 120/77 (BP Location: Left Arm)   Pulse (!) 132   Temp 98.1 F (36.7 C) (Oral)   Resp 20   Ht 5\' 1"  (1.549 m)   Wt 204 lb (92.5 kg)   LMP 01/11/2017 (Approximate) Comment: pt states aroudn 2 or 3 weeks ago  SpO2 91%   BMI 38.55 kg/m   Vital signs normal except for tachycardia and borderline hypoxia   Physical Exam  Constitutional: She is oriented to person, place, and time. She appears well-developed and well-nourished.  Non-toxic appearance. She does not appear ill. No distress.  HENT:  Head: Normocephalic and atraumatic.  Right Ear: External ear normal.  Left Ear: External ear normal.  Nose: Nose normal. No mucosal edema or rhinorrhea.  Mouth/Throat: Oropharynx is clear and moist and mucous membranes are normal. No dental abscesses or uvula swelling.  Eyes: Conjunctivae and EOM are normal. Pupils are equal, round, and reactive to light.  Neck: Normal range of motion and full passive range of motion without pain. Neck supple.  Cardiovascular: Normal rate, regular rhythm and normal heart sounds.  Exam reveals no gallop and no friction rub.   No murmur heard. Pulmonary/Chest: Effort normal. No respiratory distress. She has decreased breath sounds. She has wheezes. She has no rhonchi. She has  no rales. She exhibits no tenderness and no crepitus.  Rare wheeze  Abdominal: Soft. Normal appearance and bowel sounds are normal. She exhibits no distension. There is no tenderness. There is no rebound and no guarding.  Musculoskeletal: Normal range of motion. She exhibits no edema  or tenderness.  Moves all extremities well.   Neurological: She is alert and oriented to person, place, and time. She has normal strength. No cranial nerve deficit.  Skin: Skin is warm, dry and intact. No rash noted. No erythema. No pallor.  Multiple bruising on extremities.   Psychiatric: She has a normal mood and affect. Her speech is normal and behavior is normal. Her mood appears not anxious.  Nursing note and vitals reviewed.    ED Treatments / Results  Labs (all labs ordered are listed, but only abnormal results are displayed) Results for orders placed or performed during the hospital encounter of 02/09/17  Basic metabolic panel  Result Value Ref Range   Sodium 137 135 - 145 mmol/L   Potassium 3.4 (L) 3.5 - 5.1 mmol/L   Chloride 85 (L) 101 - 111 mmol/L   CO2 42 (H) 22 - 32 mmol/L   Glucose, Bld 210 (H) 65 - 99 mg/dL   BUN 13 6 - 20 mg/dL   Creatinine, Ser 5.36 0.44 - 1.00 mg/dL   Calcium 9.3 8.9 - 64.4 mg/dL   GFR calc non Af Amer >60 >60 mL/min   GFR calc Af Amer >60 >60 mL/min   Anion gap 10 5 - 15  CBC  Result Value Ref Range   WBC 14.0 (H) 4.0 - 10.5 K/uL   RBC 4.60 3.87 - 5.11 MIL/uL   Hemoglobin 14.6 12.0 - 15.0 g/dL   HCT 03.4 (H) 74.2 - 59.5 %   MCV 101.3 (H) 78.0 - 100.0 fL   MCH 31.7 26.0 - 34.0 pg   MCHC 31.3 30.0 - 36.0 g/dL   RDW 63.8 75.6 - 43.3 %   Platelets 169 150 - 400 K/uL  Brain natriuretic peptide  Result Value Ref Range   B Natriuretic Peptide 104.2 (H) 0.0 - 100.0 pg/mL  Troponin I  Result Value Ref Range   Troponin I <0.03 <0.03 ng/mL  Protime-INR  Result Value Ref Range   Prothrombin Time 12.3 11.4 - 15.2 seconds   INR 0.92   APTT  Result Value Ref Range     aPTT 29 24 - 36 seconds  Blood gas, venous  Result Value Ref Range   O2 Content 4.0 L/min   Delivery systems NASAL CANNULA    pH, Ven 7.314 7.250 - 7.430   pCO2, Ven 87.2 (HH) 44.0 - 60.0 mmHg   pO2, Ven BELOW REPORTABLE RANGE. 32.0 - 45.0 mmHg   Bicarbonate 43.1 (H) 20.0 - 28.0 mmol/L   Acid-Base Excess 13.1 (H) 0.0 - 2.0 mmol/L   O2 Saturation 54.5 %   Patient temperature 98.6    Collection site VEIN    Drawn by [id number]    Sample type VEIN    Laboratory interpretation all normal except compensated respiratory acidosis, Mild hypokalemia, low chloride, hyperglycemia, leukocytosis    EKG  EKG Interpretation  Date/Time:  Wednesday February 09 2017 03:39:00 EST Ventricular Rate:  129 PR Interval:    QRS Duration: 78 QT Interval:  319 QTC Calculation: 468 R Axis:   99 Text Interpretation:  Sinus tachycardia LAE, consider biatrial enlargement Probable lateral infarct, old Since last tracing rate faster 31 Jan 2017 Confirmed by Vonzell Lindblad  MD-I, Tya Haughey ([id number]) on 02/09/2017 3:58:33 AM       Radiology Dg Chest Port 1 View  Result Date: 02/09/2017 CLINICAL DATA:  Sudden onset dyspnea. EXAM: PORTABLE CHEST 1 VIEW COMPARISON:  02/01/2017, 01/31/2017. FINDINGS: Patchy airspace opacity in the left lung base could represent  pneumonia. Right lung is clear. No large effusions. Pulmonary vasculature is normal. IMPRESSION: Patchy airspace opacity in the left base. Cannot exclude pneumonia. Followup PA and lateral chest X-ray is recommended in 3-4 weeks following trial of antibiotic therapy to ensure resolution and exclude underlying malignancy. Electronically Signed   By: Ellery Plunkaniel R Mitchell M.D.   On: 02/09/2017 04:30    Procedures Procedures (including critical care time)  CRITICAL CARE Performed by: Thaxton Pelley L Myrta Mercer Total critical care time: 40 minutes Critical care time was exclusive of separately billable procedures and treating other patients. Critical care was necessary to treat or prevent  imminent or life-threatening deterioration. Critical care was time spent personally by me on the following activities: development of treatment plan with patient and/or surrogate as well as nursing, discussions with consultants, evaluation of patient's response to treatment, examination of patient, obtaining history from patient or surrogate, ordering and performing treatments and interventions, ordering and review of laboratory studies, ordering and review of radiographic studies, pulse oximetry and re-evaluation of patient's condition.   Medications Ordered in ED Medications  piperacillin-tazobactam (ZOSYN) IVPB 3.375 g (not administered)  vancomycin (VANCOCIN) IVPB 1000 mg/200 mL premix (not administered)  levalbuterol (XOPENEX) nebulizer solution 1.25 mg (not administered)  ipratropium (ATROVENT) nebulizer solution 0.5 mg (not administered)  acetaminophen (TYLENOL) tablet 650 mg (650 mg Oral Given 02/09/17 0524)  albuterol (PROVENTIL,VENTOLIN) solution continuous neb (10 mg/hr Nebulization Given 02/09/17 0453)  methylPREDNISolone sodium succinate (SOLU-MEDROL) 125 mg/2 mL injection 125 mg (125 mg Intravenous Given 02/09/17 0523)     Initial Impression / Assessment and Plan / ED Course  I have reviewed the triage vital signs and the nursing notes.  Pertinent labs & imaging results that were available during my care of the patient were reviewed by me and considered in my medical decision making (see chart for details).  Patient was started on BiPAP and given a continuous nebulizer.  Her ABG shows a compensated respiratory acidosis.   Recheck at 05:45 AM still feel tight. Second nebulizer ordered. CXR shows possible pneumonia, started on HAP antibiotics.    06:18 AM Dr Clyde LundborgNiu, will admit   Final Clinical Impressions(s) / ED Diagnoses   Final diagnoses:  COPD exacerbation (HCC)  Healthcare-associated pneumonia    Plan admission  Devoria AlbeIva Mailey Landstrom, MD, Concha PyoFACEP       Mychaela Lennartz, MD 02/09/17  [phone]

## 2017-02-09 NOTE — Progress Notes (Signed)
Nutrition Brief Note  Patient seen per COPD protocol.  Wt Readings from Last 15 Encounters:  02/09/17 204 lb (92.5 kg)  02/04/17 204 lb (92.5 kg)  01/15/17 201 lb 6.4 oz (91.4 kg)  11/09/16 201 lb 6.4 oz (91.4 kg)  09/07/16 196 lb (88.9 kg)  08/28/16 198 lb 3.2 oz (89.9 kg)  08/13/16 197 lb 8.5 oz (89.6 kg)  07/29/16 199 lb (90.3 kg)  07/27/16 198 lb (89.8 kg)  07/22/16 199 lb 6.4 oz (90.4 kg)  05/31/16 193 lb (87.5 kg)  04/30/16 194 lb (88 kg)  04/20/16 196 lb (88.9 kg)  04/11/16 190 lb 0.6 oz (86.2 kg)  01/22/16 202 lb 8 oz (91.9 kg)    Body mass index is 38.55 kg/m. Patient meets criteria for obesity based on current BMI. Skin WDL.  Current diet order is Heart Healthy/Carb Modified. Pt consumed 100% of breakfast which consisted of omelet with vegetables, breakfast potatoes, 3 pieces Malawiturkey sausage, fresh fruit cup, cup of grapes, and cup of coffee. Pt reports that at home she follows low Na diet and that she monitors carbohydrate intake for the 1 week following hospital discharges d/t steroid-induced hyperglycemia. She states that while she is on Carb Modified diet she "makes a game of it to see how much food I can get that stays within my carb limit." Pt reports very good appetite now and PTA and denies any abdominal pain or nausea with PO intakes.   Labs and medications reviewed.   No nutrition interventions warranted at this time. If nutrition issues arise, please consult RD.     Trenton GammonJessica Ava Tangney, MS, RD, LDN, [facility] Inpatient Clinical Dietitian Pager # [phone] After hours/weekend pager # [phone]

## 2017-02-09 NOTE — Progress Notes (Addendum)
CSW met with pt to complete assessment.  Pt refused SBIRT and stated pt does "not have a problem" with alcohol, at this time.  CSW offered and pt accepted resources for intensive outpatient alcohol treatment resources in [place], [place].  CSW also offered and pt also accepted meeting schedule for Alcoholics anonymous meetings in [place], [place].   Pt identified pt has BIPAP machine at home that is not working properly, but refused assistance from the [facility] in alerting Case Management that pt needs assistance with equipment at D/C stating, "I would rather handle it myself, I've been dealing with them for years".  CSW encouraged pt to alert staff should Case Management need to be contacted on behalf of the pt for assistance with BIPAP.  Pt confirmed she understood and will if necessary. Please reconsult if future social work needs arise.    Alphonse Guild. Nhung Danko, Latanya Presser, LCAS Clinical Social Worker Ph: [phone]

## 2017-02-09 NOTE — Clinical Social Work Note (Signed)
Clinical Social Work Assessment  Patient Details  Name: Mary [name] MRN: [id number] Date of Birth: [birth year]  Date of referral:  02/09/17               Reason for consult:  Facility Placement                Permission sought to share information with:  Family Supports, Chartered certified accountant granted to share information::  Yes, Verbal Permission Granted  Name::        Agency::     Relationship::     Contact Information:     Housing/Transportation Living arrangements for the past 2 months:  Single Family Home Source of Information:  Patient Patient Interpreter Needed:  None Criminal Activity/Legal Involvement Pertinent to Current Situation/Hospitalization:    Significant Relationships:  Spouse Lives with:  Spouse Do you feel safe going back to the place where you live?  Yes Need for family participation in patient care:  No (Coment)  Care giving concerns:  Pt's husband presented as concerned about pt's alcohol use, but did not say anything. Pt requested CSW discuss pt's ETOH use in front of husband but states is "no longer a problem".  Pt refused SBIRT.   Social Worker assessment / plan:  CSW met with pt and confirmed pt's plan to be discharged home to live at discharge. CSW provided pt with outpatient substance abuse treatment resources and [facility] Alcoholics Anonymous 12-INOM meeting schedules.  Pt has been living independently with her husband Mary [name] prior to being admitted.   Employment status:  Unemployed Forensic scientist:  Managed Care PT Recommendations:  Not assessed at this time Information / Referral to community resources:     Patient/Family's Response to care:  Patient alert and oriented.  Patient and pt's husband agreeable to plan.  Pt's hussband supportive and strongly involved in pt.'s care.  Pt and pt's husband pleasant and appreciated CSW intervention.     Patient/Family's Understanding of and Emotional Response to Diagnosis,  Current Treatment, and Prognosis:  Still assessing  Emotional Assessment Appearance:  Appears older than stated age Attitude/Demeanor/Rapport:    Affect (typically observed):  Accepting, Adaptable, Calm, Pleasant Orientation:  Oriented to Self, Oriented to Place, Oriented to  Time, Oriented to Situation Alcohol / Substance use:  Alcohol Use Psych involvement (Current and /or in the community):     Discharge Needs  Concerns to be addressed:  No discharge needs identified Readmission within the last 30 days:  Yes Current discharge risk:  None Barriers to Discharge:  No Barriers Identified   Claudine Mouton, LCSWA 02/09/2017, 10:13 PM

## 2017-02-09 NOTE — ED Triage Notes (Signed)
Since 0200 am increasing shortness of breath non prod cough no fever with wheezing.

## 2017-02-09 NOTE — Progress Notes (Signed)
Pharmacy Antibiotic Note  Mary HakimMichelle L [name] is a 41 y.o. female with hx COPD [facility], presented to the ED on 02/09/17 with c/o SOB, and cough.  CXR showed patchy airspace opacity in the left base. To start vancomycin and zosyn for suspected PNA.  -  wbc 14, scr 0.67 (crcl~98)   Plan: - Zosyn 3.375 gm IV x1 over 30 min, then 3.375 gm IV q8h (infuse over 4 hours) - Vancomycin 1000 mg IV q8h - daily scr ___________________________  Height: 5\' 1"  (154.9 cm) Weight: 204 lb (92.5 kg) IBW/kg (Calculated) : 47.8  Temp (24hrs), Avg:98.1 F (36.7 C), Min:98.1 F (36.7 C), Max:98.1 F (36.7 C)   Recent Labs Lab 02/03/17 0532 02/04/17 0654 02/09/17 0340 02/09/17 0621  WBC 12.1* 13.7* 14.0*  --   CREATININE 0.55  --  0.67  --   LATICACIDVEN  --   --   --  1.8    Estimated Creatinine Clearance: 97.9 mL/min (by C-G formula based on SCr of 0.67 mg/dL).    No Known Allergies   Thank you for allowing pharmacy to be a part of this patient's care.  Lucia Gaskinsham, Airelle Everding P 02/09/2017 7:50 AM

## 2017-02-09 NOTE — Consult Note (Signed)
PULMONARY / CRITICAL CARE MEDICINE   Name: Mary [name] MRN: [id number] DOB: 26-Nov-1976    ADMISSION DATE:  02/09/2017 CONSULTATION DATE:  02/01/2017  REFERRING MD:  Butler Denmark  CHIEF COMPLAINT:  Dyspnea, sleepiness  HISTORY OF PRESENT ILLNESS:   41 year old female well known to the pulmonary service for recurrent exacerbations of her COPD was admitted on 01/31/2017 for acute on chronic hypercapnic respiratory failure. She was treated for a COPD exacerbation with bronchodilators and solumedrol and was discharged home on 02/04/2017 with a prednisone taper, Breo and Spiriva.    She came back on 3/7 with the sudden onset of dyspnea, coughing, and a burning sensation in her throat.  She said that she woke suddenly feeling like she had stopped breathing.  She was found to have left lower lobe pneumonia.    On my exam today she admits to smoking cigarettes for the last several months.     PAST MEDICAL HISTORY :  She  has a past medical history of Axillary adenopathy; Chronic right-sided CHF (congestive heart failure) (03/23/2012); COPD (chronic obstructive pulmonary disease) (HCC); DVT of upper extremity (deep vein thrombosis) [facility]) (April 2013); Exertional shortness of breath; Hepatic cirrhosis (HCC); History of alcoholism (HCC); History of chronic bronchitis; Irregular menses; Migraine; Moderate to severe pulmonary hypertension (April 2013); On home oxygen therapy; OSA (obstructive sleep apnea); Periodontal disease; Pneumonia (~ 1985; 01/2011); and Pulmonary embolus [facility]) (April 2013).  PAST SURGICAL HISTORY: She  has a past surgical history that includes Cardiac surgery (13-Apr-1976); Lung surgery (1976/03[birth year]08); Appendectomy (05/03/2013); Cardiac catheterization (03/2012); laparoscopic appendectomy (N/A, 05/03/2013); right heart catheterization (N/A, 03/07/2012); and Cardiac catheterization (N/A, 01/08/2016).  No Known Allergies  No current facility-administered medications on file prior to encounter.     Current Outpatient Prescriptions on File Prior to Encounter  Medication Sig  . albuterol (PROVENTIL HFA;VENTOLIN HFA) 108 (90 BASE) MCG/ACT inhaler Inhale 2 puffs into the lungs every 6 (six) hours as needed for wheezing or shortness of breath.  Marland Kitchen BREO ELLIPTA 100-25 MCG/INH AEPB INHALE 1 PUFF INTO THE LUNGS DAILY.  Marland Kitchen gabapentin (NEURONTIN) 300 MG capsule TAKE 2 CAPSULES BY MOUTH 3 TIMES DAILY.  Marland Kitchen glipiZIDE (GLUCOTROL) 5 MG tablet Take 1 tablet (5 mg total) by mouth daily before breakfast. For one week ONLY while on high-dose prednisone  . LYRICA 50 MG capsule TAKE 1 CAPSULE BY MOUTH 3 TIMES DAILY  . pantoprazole (PROTONIX) 40 MG tablet Take 1 tablet (40 mg total) by mouth 2 (two) times daily before a meal. (Patient taking differently: Take 40 mg by mouth daily. )  . potassium chloride SA (K-DUR,KLOR-CON) 20 MEQ tablet Take 1 tablet (20 mEq total) by mouth daily.  . predniSONE (DELTASONE) 5 MG tablet Take  20 mg (4 Tab) qam with breakfast 1 week, then take 10 mg (2 tab) qam with breakfast 1 week, then 5 mg daily indefinitely  . roflumilast (DALIRESP) 500 MCG TABS tablet Take 500 mcg by mouth daily.  Marland Kitchen SPIRIVA RESPIMAT 1.25 MCG/ACT AERS INHALE 2 PUFFS INTO THE LUNGS 2 (TWO) TIMES DAILY. (Patient taking differently: Inhale 2 puffs into the lungs daily. )  . torsemide (DEMADEX) 20 MG tablet Take 1 tablet (20 mg total) by mouth daily.  . traMADol (ULTRAM) 50 MG tablet TAKE 1 TABLET BY MOUTH EVERY 6 HOURS AS NEEDED FOR SEVERE PAIN    FAMILY HISTORY:  Her indicated that her father is deceased. She indicated that the status of her brother is unknown. She indicated that her maternal grandmother is deceased.  She indicated that her maternal grandfather is deceased. She indicated that her paternal grandmother is deceased. She indicated that her paternal grandfather is deceased.    SOCIAL HISTORY: She  reports that she quit smoking about 4 years ago. Her smoking use included Cigarettes. She has a  25.00 pack-year smoking history. She has never used smokeless tobacco. She reports that she drinks about 1.8 oz of alcohol per week . She reports that she does not use drugs.  REVIEW OF SYSTEMS:   Gen: Denies fever, chills, weight change, fatigue, night sweats HEENT: Denies blurred vision, double vision, hearing loss, tinnitus, sinus congestion, rhinorrhea, sore throat, neck stiffness, dysphagia PULM: per HPI CV: Denies chest pain, edema, orthopnea, paroxysmal nocturnal dyspnea, palpitations GI: Denies abdominal pain, nausea, vomiting, diarrhea, hematochezia, melena, constipation, change in bowel habits GU: Denies dysuria, hematuria, polyuria, oliguria, urethral discharge Endocrine: Denies hot or cold intolerance, polyuria, polyphagia or appetite change Derm: Denies rash, dry skin, scaling or peeling skin change Heme: Denies easy bruising, bleeding, bleeding gums Neuro: Denies headache, numbness, weakness, slurred speech, loss of memory or consciousness    SUBJECTIVE:  As above  VITAL SIGNS: BP 110/68 (BP Location: Right Arm)   Pulse 120   Temp 98.1 F (36.7 C) (Oral)   Resp 17   Ht 5\' 1"  (1.549 m)   Wt 204 lb (92.5 kg)   LMP 01/11/2017 (Approximate) Comment: pt states aroudn 2 or 3 weeks ago  SpO2 91%   BMI 38.55 kg/m   HEMODYNAMICS:    VENTILATOR SETTINGS:    INTAKE / OUTPUT: No intake/output data recorded.  PHYSICAL EXAMINATION:  Gen: chronically ill appearing, resting comfortably in bed HENT: NCAT, OP clear, neck supple without masses Eyes: PERRL, EOMi Lymph: no cervical lymphadenopathy PULM: few crackles left lung, normal effort, no wheezing CV: RRR, slight systolic murmur, no JVD GI: BS+, soft, nontender, no hsm Derm: no rash or skin breakdown MSK: normal bulk and tone Neuro: A&Ox4, CN II-XII intact, strength 5/5 in all 4 extremities Psyche: normal mood and affect   LABS:  BMET  Recent Labs Lab 02/03/17 0532 02/09/17 0340  NA 136 137  K 3.9 3.4*   CL 87* 85*  CO2 40* 42*  BUN 17 13  CREATININE 0.55 0.67  GLUCOSE 156* 210*    Electrolytes  Recent Labs Lab 02/03/17 0532 02/09/17 0340  CALCIUM 9.2 9.3    CBC  Recent Labs Lab 02/03/17 0532 02/04/17 0654 02/09/17 0340  WBC 12.1* 13.7* 14.0*  HGB 13.0 14.2 14.6  HCT 40.9 44.7 46.6*  PLT 123* 152 169    Coag's  Recent Labs Lab 02/09/17 0340  APTT 29  INR 0.92    Sepsis Markers  Recent Labs Lab 02/09/17 0621  LATICACIDVEN 1.8    ABG No results for input(s): PHART, PCO2ART, PO2ART in the last 168 hours.  Liver Enzymes No results for input(s): AST, ALT, ALKPHOS, BILITOT, ALBUMIN in the last 168 hours.  Cardiac Enzymes  Recent Labs Lab 02/09/17 0340  TROPONINI <0.03    Glucose  Recent Labs Lab 02/03/17 0738 02/03/17 1133 02/03/17 1716 02/03/17 2056 02/04/17 0739 02/04/17 1140  GLUCAP 180* 331* 334* 212* 191* 242*    Imaging Dg Chest Port 1 View  Result Date: 02/09/2017 CLINICAL DATA:  Sudden onset dyspnea. EXAM: PORTABLE CHEST 1 VIEW COMPARISON:  02/01/2017, 01/31/2017. FINDINGS: Patchy airspace opacity in the left lung base could represent pneumonia. Right lung is clear. No large effusions. Pulmonary vasculature is normal. IMPRESSION: Patchy airspace opacity in  the left base. Cannot exclude pneumonia. Followup PA and lateral chest X-ray is recommended in 3-4 weeks following trial of antibiotic therapy to ensure resolution and exclude underlying malignancy. Electronically Signed   By: Ellery Plunkaniel R Mitchell M.D.   On: 02/09/2017 04:30     STUDIES:  PFTS 03/08/12: fev1 0.58L/1%, FVC 1.18/33%, Ratop 49 and c/w severe obstruction. 21% BD response on FVC, RV 219%, DLCO 11/54%  PFT 04/10/12: fev1 0.54L/18%, 31% BD response, RATio 49, TL 88%, RV 204$, DLCO 30%,  Abg baseline MAy 2013: 7.43/50/81 CTA Chest 12/16/15 >neg PE, stable scarring in lingular/LUL.  Echo 08/2016 > LVEF 65-70% grade 1 diastolic dysfunction, RV normal size/function, no evidence  of elevated PA pressure 2/27 Nicotine metabolites blood test > POSITIVE 3/7 CXR images personally reviewed showing patchy airspace disease left lung  CULTURES:   ANTIBIOTICS: 3/7 vanc >  3/7 zosyn >   SIGNIFICANT EVENTS:   LINES/TUBES:   DISCUSSION: 41 year old female with chronic respiratory failure with hypoxemia and hypercapnia, severe airflow obstruction on lung function testing seen in 2013, mildly elevated PA pressures seen on February 2017 right heart catheterization with normal wedge pressure was admitted on 02/09/2017 with HCAP.  During her last hospitalization we found that her nicotine metabolite level was elevated, consistent with tobacco abuse.   ASSESSMENT / PLAN:  PULMONARY A: HCAP Acute on chronic respiratory failure with hypoxemia Severe COPD, possible mild exacerbation Continued Tobacco abuse Obesity hypoventillation, chronic respiratory failure with hypercapnea P:   Solumedrol, IV will change to daily dosing Continue brovana/pulmicort in hospital Duoneb q6h Agree with Vancomycin/zosyn F/u culture BIPAP qHS Counseled at length today to quit smoking.  Will follow   Heber CarolinaBrent Eleno Weimar, MD [place] [facility] Pager: [phone] Cell: [phone] After 3pm or if no response, call [phone]   02/09/2017, 8:28 AM

## 2017-02-09 NOTE — Progress Notes (Signed)
This is a no charge note  Pending admission per Dr. Lynelle DoctorKnapp  41 year old lady with past medical history of severe COPD, frequent admission, on 2-3 liters of oxygen at home, diabetes mellitus, PVD, DVT, OSA, alcohol abuse, hepatic cirrhosis, chronic right-sided CHF, who presents with worsening shortness breath and nonproductive cough. Patient was just admitted recently for COPD exacerbation and discharged on 02/04/17. CXR showed left base infiltration indicating possible HCAP. Pt refused ABG. Oxygen saturation 90% on room air, tachycardia, tachypnea WBC 14.0, negative troponin, BNP 104. Pt is accepted to [facility] as inpt. On BiPAP. IV abx started.  [name], MD  [facility] Pager [phone]  If 7PM-7AM, please contact night-coverage [email or web address] Password [email or web address] 02/09/2017, 6:23 AM

## 2017-02-09 NOTE — ED Notes (Signed)
Bed: [id number] Expected date:  Expected time:  Means of arrival:  Comments: [id number] SOB

## 2017-02-10 DIAGNOSIS — G4733 Obstructive sleep apnea (adult) (pediatric): Secondary | ICD-10-CM

## 2017-02-10 DIAGNOSIS — J189 Pneumonia, unspecified organism: Principal | ICD-10-CM

## 2017-02-10 DIAGNOSIS — I5032 Chronic diastolic (congestive) heart failure: Secondary | ICD-10-CM

## 2017-02-10 LAB — CBC
HEMATOCRIT: 38.7 % (ref 36.0–46.0)
HEMOGLOBIN: 12.5 g/dL (ref 12.0–15.0)
MCH: 32.1 pg (ref 26.0–34.0)
MCHC: 32.3 g/dL (ref 30.0–36.0)
MCV: 99.5 fL (ref 78.0–100.0)
Platelets: 159 10*3/uL (ref 150–400)
RBC: 3.89 MIL/uL (ref 3.87–5.11)
RDW: 13.3 % (ref 11.5–15.5)
WBC: 12.8 10*3/uL — ABNORMAL HIGH (ref 4.0–10.5)

## 2017-02-10 LAB — BASIC METABOLIC PANEL
ANION GAP: 8 (ref 5–15)
BUN: 11 mg/dL (ref 6–20)
CALCIUM: 9.5 mg/dL (ref 8.9–10.3)
CO2: 37 mmol/L — ABNORMAL HIGH (ref 22–32)
Chloride: 90 mmol/L — ABNORMAL LOW (ref 101–111)
Creatinine, Ser: 0.49 mg/dL (ref 0.44–1.00)
GLUCOSE: 307 mg/dL — AB (ref 65–99)
POTASSIUM: 4.5 mmol/L (ref 3.5–5.1)
Sodium: 135 mmol/L (ref 135–145)

## 2017-02-10 LAB — STREP PNEUMONIAE URINARY ANTIGEN: Strep Pneumo Urinary Antigen: NEGATIVE

## 2017-02-10 LAB — GLUCOSE, CAPILLARY
GLUCOSE-CAPILLARY: 382 mg/dL — AB (ref 65–99)
GLUCOSE-CAPILLARY: 361 mg/dL — AB (ref 65–99)
Glucose-Capillary: 345 mg/dL — ABNORMAL HIGH (ref 65–99)
Glucose-Capillary: 357 mg/dL — ABNORMAL HIGH (ref 65–99)

## 2017-02-10 MED ORDER — INSULIN GLARGINE 100 UNIT/ML ~~LOC~~ SOLN
15.0000 [IU] | Freq: Every day | SUBCUTANEOUS | Status: DC
  Administered 2017-02-11: 15 [IU] via SUBCUTANEOUS
  Filled 2017-02-10: qty 0.15

## 2017-02-10 NOTE — Progress Notes (Signed)
Pt transferred to room 1327 via wheelchair on 4.5 l oxygen. Patient has refused scds,nonskid socks and has refused to call for help when she gets out of bed.

## 2017-02-10 NOTE — Progress Notes (Addendum)
[facility] PROGRESS NOTE  Mary [name] NWG:[id number] DOB: [birth year] DOA: 02/09/2017 PCP: Jeanine Luz, FNP  Interim summary and HPI 41 y/o female with morbid obesity, chronic resp failure due to COPD, OHS/OSA (chronically on BIPAP at night and 2.5-3 L [place] throughout the day), DM type 2 on insulin, chronic diastolic HF and recent hospitalization for COPD exacerbation; presented due to worsening dyspnea and found to have HCAP.  Assessment/Plan: 1-acute on chronic resp failure: with hypoxia -due to HCAP and COPD exacerbation -will continue IV antibiotics -continue supportive care, oxygen supplementation and PRN BIPAP (with mainly QHS BIPAP) -follow cultures and rec's from pulmonary service  2-COPD exacerbation -continue neb therapy, flutter valve and solumedrol -will follow clinical response  3-diabetes with hyperglycemia: most likely from steroids and infection -will continue SSI and lantus; last one adjusted for better control  4-OSA/OHS -continue BIPAP QHS -low calorie diet and weight loss discussed with patient  5-hypokalemia: -will follow trend and replete as needed  6-chronic diastolic heart failure -no crackles on exam, no LE edema -follow heart healthy diet and daily weights -will also check I's and O's -compensated currently  7-GERD -continue PPI  8-morbid obesity -Body mass index is 39.53 kg/m. -low calorie diet discussed with patient   9-alcohol use and tobacco use -cessation/abstinence discussed with patient     Code Status: Full Family Communication: no family at bedside  Disposition Plan: given needs for BIPAP and still ongoing SOB (with overall poor reserve), will keep in stepdown until tomorrow, unless pulmonary service feel she is stable to be transfer. Continue IV antibiotics, continue nebulizer therapy. Will follow pulmonary service rec's.   Consultants:  PCCM  Procedures:  See below for x-ray reports   Antibiotics:  vanc and  zosyn 02/09/17  HPI/Subjective: Afebrile, no CP; breathing slightly better, but still SOB. On BIPAP on my arrival.  Objective: Vitals:   02/10/17 0800 02/10/17 0837  BP: 133/90   Pulse: 89 90  Resp: 19 18  Temp:      Intake/Output Summary (Last 24 hours) at 02/10/17 1014 Last data filed at 02/10/17 0619  Gross per 24 hour  Intake              250 ml  Output              850 ml  Net             -600 ml   Filed Weights   02/09/17 0329 02/10/17 0618  Weight: 92.5 kg (204 lb) 94.9 kg (209 lb 3.5 oz)    Exam:   General:  Afebrile currently; but reporting chills/night sweats. No CP, but endorses still SOB (even better than when she came in). On my exam patient was using BIPAP.  Cardiovascular: S1 and S2, no rubs, no gallops  Respiratory: fair air movement, decrease BS at bases, end exp wheezing appreciated  Abdomen: soft, NT, ND, positive BS, obese  Musculoskeletal: no edema, no cyanosis, good muscle tone and FROM  Data Reviewed: Basic Metabolic Panel:  Recent Labs Lab 02/09/17 0340 02/10/17 0346  NA 137 135  K 3.4* 4.5  CL 85* 90*  CO2 42* 37*  GLUCOSE 210* 307*  BUN 13 11  CREATININE 0.67 0.49  CALCIUM 9.3 9.5   CBC:  Recent Labs Lab 02/04/17 0654 02/09/17 0340 02/10/17 0346  WBC 13.7* 14.0* 12.8*  HGB 14.2 14.6 12.5  HCT 44.7 46.6* 38.7  MCV 100.9* 101.3* 99.5  PLT 152 169 159   Cardiac Enzymes:  Recent Labs Lab 02/09/17 0340  TROPONINI <0.03   BNP (last 3 results)  Recent Labs  07/18/16 0400 08/25/16 0713 02/09/17 0340  BNP 100.3* 23.4 104.2*    ProBNP (last 3 results)  Recent Labs  04/20/16 1703  PROBNP 30.0    CBG:  Recent Labs Lab 02/04/17 1140 02/09/17 1205 02/09/17 1712 02/09/17 2127 02/10/17 0823  GLUCAP 242* 359* 288* 287* 382*    Recent Results (from the past 240 hour(s))  MRSA PCR Screening     Status: None   Collection Time: 02/09/17 12:07 PM  Result Value Ref Range Status   MRSA by PCR NEGATIVE  NEGATIVE Final    Comment:        The GeneXpert MRSA Assay (FDA approved for NASAL specimens only), is one component of a comprehensive MRSA colonization surveillance program. It is not intended to diagnose MRSA infection nor to guide or monitor treatment for MRSA infections.      Studies: Dg Chest Port 1 View  Result Date: 02/09/2017 CLINICAL DATA:  Sudden onset dyspnea. EXAM: PORTABLE CHEST 1 VIEW COMPARISON:  02/01/2017, 01/31/2017. FINDINGS: Patchy airspace opacity in the left lung base could represent pneumonia. Right lung is clear. No large effusions. Pulmonary vasculature is normal. IMPRESSION: Patchy airspace opacity in the left base. Cannot exclude pneumonia. Followup PA and lateral chest X-ray is recommended in 3-4 weeks following trial of antibiotic therapy to ensure resolution and exclude underlying malignancy. Electronically Signed   By: Ellery Plunkaniel R Mitchell M.D.   On: 02/09/2017 04:30    Scheduled Meds: . enoxaparin (LOVENOX) injection  40 mg Subcutaneous Q24H  . fluticasone furoate-vilanterol  1 puff Inhalation Daily  . gabapentin  600 mg Oral TID  . guaiFENesin  600 mg Oral BID  . insulin aspart  0-15 Units Subcutaneous TID WC  . insulin aspart  0-5 Units Subcutaneous QHS  . insulin glargine  10 Units Subcutaneous Daily  . ipratropium-albuterol  3 mL Nebulization QID  . methylPREDNISolone sodium succinate  60 mg Intravenous Q8H  . pantoprazole  40 mg Oral Daily  . piperacillin-tazobactam (ZOSYN)  IV  3.375 g Intravenous Q8H  . potassium chloride SA  20 mEq Oral Daily  . pregabalin  50 mg Oral TID  . roflumilast  500 mcg Oral Daily  . sodium chloride flush  3 mL Intravenous Q12H  . sodium chloride flush  3 mL Intravenous Q12H  . torsemide  20 mg Oral Daily  . vancomycin  1,000 mg Intravenous Q8H   Continuous Infusions:  Principal Problem:   COPD exacerbation (HCC) Active Problems:   OSA on BiPAP   Hypokalemia   Esophageal reflux   Morbid obesity (HCC)    Chronic diastolic heart failure (HCC)   Diabetes mellitus type 2 in obese (HCC)   Acute on chronic respiratory failure with hypoxia and hypercapnia (HCC)   HCAP (healthcare-associated pneumonia)    Time spent: 35 minutes    Vassie LollMadera, Kinnedy Mongiello  [facility] Pager [phone]. If 7PM-7AM, please contact night-coverage at [email or web address], password [email or web address] 02/10/2017, 10:14 AM  LOS: 1 day

## 2017-02-10 NOTE — Progress Notes (Signed)
OT Cancellation Note  Patient Details Name: Mary [name] MRN: [id number] DOB: 06/25/1976   Cancelled Treatment:    Reason Eval/Treat Not Completed: OT screened, no needs identified, will sign off. I recently saw pt during last hospitalization. She feels like she is moving well and doesn't need OT. Educated on energy conservation last visit.  Tej Murdaugh 02/10/2017, 2:51 PM  Marica OtterMaryellen Shereese Bonnie, OTR/L [phone] 02/10/2017

## 2017-02-10 NOTE — Care Management Note (Signed)
Case Management Note  Patient Details  Name: Jana HakimMichelle L Langwell MRN: [id number] Date of Birth: 12/10/1975  Subjective/Objective:     bipap               Action/Plan: Date:  February 10, 2017 Chart reviewed for concurrent status and case management needs. Will continue to follow patient progress. Discharge Planning: following for needs Expected discharge date: [id number] Marcelle SmilingRhonda Davis, BSN, [place]3, [place]CCM   [phone] Expected Discharge Date:   (unknown)               Expected Discharge Plan:  Home/Self Care  In-House Referral:     Discharge planning Services     Post Acute Care Choice:    Choice offered to:     DME Arranged:    DME Agency:     HH Arranged:    HH Agency:     Status of Service:  In process, will continue to follow  If discussed at Long Length of Stay Meetings, dates discussed:    Additional Comments:  Golda AcreDavis, Rhonda Lynn, RN 02/10/2017, 9:33 AM

## 2017-02-10 NOTE — Evaluation (Signed)
Physical Therapy Evaluation Patient Details Name: Mary [name] MRN: [id number] DOB: 10/12/1976 Today's Date: 02/10/2017   History of Present Illness  Mary [name] is a 41 y.o. female with medical history significant of COPD with oxygen dependency of 3-1/2 L at baseline, DM2, Diastolic CHF, DVT, alcoholism, migraines, pulmonary hypertension, pulmonary embolus, CHF, fatty liver disease. admitted with increased respiratory distress.  Clinical Impression  The patient is very mobile on 4.5 liters with saturation dropping to 85% with activity. Pt admitted with above diagnosis. Pt currently with functional limitations due to the deficits listed below (see PT Problem List).  Pt will benefit from skilled PT to increase their independence and safety with mobility to allow discharge to the venue listed below.       Follow Up Recommendations No PT follow up    Equipment Recommendations  None recommended by PT    Recommendations for Other Services       Precautions / Restrictions Precautions Precautions: Fall Precaution Comments: monitor O2    on home O2      Mobility  Bed Mobility Overal bed mobility: Independent                Transfers Overall transfer level: Independent               [facility]  Ambulation/Gait Ambulation/Gait assistance: Supervision Ambulation Distance (Feet): 60 Feet Assistive device: None       General Gait Details: held onto bed at times. Sats 85% on 4.5 liters  Stairs            Wheelchair Mobility    Modified Rankin (Stroke Patients Only)       Balance     Sitting balance-Leahy Scale: Normal       Standing balance-Leahy Scale: Good                               Pertinent Vitals/Pain Pain Score: 4  Pain Location: all over Pain Descriptors / Indicators: Aching Pain Intervention(s): Monitored during session;Premedicated before session    Home Living Family/patient  expects to be discharged to:: Private residence Living Arrangements: Spouse/significant other Available Help at Discharge: Family;Available PRN/intermittently   Home Access: Stairs to enter Entrance Stairs-Rails: Right Entrance Stairs-Number of Steps: 3 Home Layout: One level 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lectric scooter;Wheelchair - manual Additional Comments: w/c doesn't have foot rests; uses motorized scooter at [employer]    Prior Function Level of Independence: Independent;Independent with assistive device(s)         Comments: home alone during the day     Hand Dominance        Extremity/Trunk Assessment   Upper Extremity Assessment Upper Extremity Assessment: Overall WFL for tasks assessed    Lower Extremity Assessment Lower Extremity Assessment: Overall WFL for tasks assessed    Cervical / Trunk Assessment Cervical / Trunk Assessment: Kyphotic  Communication   Communication: No difficulties  Cognition Arousal/Alertness: Awake/alert Behavior During Therapy: WFL for tasks assessed/performed Overall Cognitive Status: Within Functional Limits for tasks assessed                      General Comments      Exercises     Assessment/Plan    PT Assessment Patient needs continued PT services  PT Problem List Decreased activity tolerance;Decreased mobility;Cardiopulmonary status limiting activity;Obesity       PT Treatment Interventions Gait training;Therapeutic activities;Therapeutic exercise;Functional mobility  training;Patient/family education    PT Goals (Current goals can be found in the Care Plan section)  Acute Rehab PT Goals Patient Stated Goal: to move around more PT Goal Formulation: With patient Time For Goal Achievement: 02/17/17 Potential to Achieve Goals: Good    Frequency Min 3X/week   Barriers to discharge        Co-evaluation               End of Session Equipment Utilized During Treatment: Oxygen Activity Tolerance:  Patient tolerated treatment well Patient left: in chair;with call bell/phone within reach Nurse Communication: Mobility status PT Visit Diagnosis: Difficulty in walking, not elsewhere classified (R26.2)         Time: 1610-9604 PT Time Calculation (min) (ACUTE ONLY): 31 min   Charges:   PT Evaluation $PT Eval Low Complexity: 1 Procedure PT Treatments $Gait Training: 8-22 mins   PT G Codes:         Rada Hay 02/10/2017, 1:31 PM Blanchard Kelch PT [phone]

## 2017-02-10 NOTE — Progress Notes (Signed)
PULMONARY / CRITICAL CARE MEDICINE   Name: Mary [name] MRN: [id number] DOB: 29-Jun-1976    ADMISSION DATE:  02/09/2017 CONSULTATION DATE:  02/01/2017  REFERRING MD:  Butler Denmark  CHIEF COMPLAINT:  Dyspnea, sleepiness  HISTORY OF PRESENT ILLNESS:   41 y/o female with COPD was admitted on 02/09/2017 with HCAP.   SUBJECTIVE:  Rested comfortably overnight Having some pain and congestion sensation in the right chest Walked some yesterday  VITAL SIGNS: BP 133/90 (BP Location: Right Arm)   Pulse 90   Temp 97.5 F (36.4 C) (Oral)   Resp 18   Ht 5\' 1"  (1.549 m)   Wt 209 lb 3.5 oz (94.9 kg)   LMP 01/11/2017 (Approximate) Comment: pt states aroudn 2 or 3 weeks ago  SpO2 96%   BMI 39.53 kg/m   HEMODYNAMICS:    VENTILATOR SETTINGS:    INTAKE / OUTPUT: I/O last 3 completed shifts: In: 250 [IV Piggyback:250] Out: 850 [Urine:850]  PHYSICAL EXAMINATION:  Gen: chronically ill appearing HENT: OP clear, TM's clear, thick neck PULM: Mild wheezing bilaterally, good air movement CV: RRR, slight murmur, trace edema GI: BS+, soft, nontender Derm: no cyanosis or rash Psyche: normal mood and affect   LABS:  BMET  Recent Labs Lab 02/09/17 0340 02/10/17 0346  NA 137 135  K 3.4* 4.5  CL 85* 90*  CO2 42* 37*  BUN 13 11  CREATININE 0.67 0.49  GLUCOSE 210* 307*    Electrolytes  Recent Labs Lab 02/09/17 0340 02/10/17 0346  CALCIUM 9.3 9.5    CBC  Recent Labs Lab 02/04/17 0654 02/09/17 0340 02/10/17 0346  WBC 13.7* 14.0* 12.8*  HGB 14.2 14.6 12.5  HCT 44.7 46.6* 38.7  PLT 152 169 159    Coag's  Recent Labs Lab 02/09/17 0340  APTT 29  INR 0.92    Sepsis Markers  Recent Labs Lab 02/09/17 0340 02/09/17 0621  LATICACIDVEN  --  1.8  PROCALCITON 0.26  --     ABG No results for input(s): PHART, PCO2ART, PO2ART in the last 168 hours.  Liver Enzymes No results for input(s): AST, ALT, ALKPHOS, BILITOT, ALBUMIN in the last 168 hours.  Cardiac  Enzymes  Recent Labs Lab 02/09/17 0340  TROPONINI <0.03    Glucose  Recent Labs Lab 02/04/17 0739 02/04/17 1140 02/09/17 1205 02/09/17 1712 02/09/17 2127 02/10/17 0823  GLUCAP 191* 242* 359* 288* 287* 382*    Imaging No results found.   STUDIES:  PFTS 03/08/12: fev1 0.58L/1%, FVC 1.18/33%, Ratop 49 and c/w severe obstruction. 21% BD response on FVC, RV 219%, DLCO 11/54%  PFT 04/10/12: fev1 0.54L/18%, 31% BD response, RATio 49, TL 88%, RV 204$, DLCO 30%,  Abg baseline MAy 2013: 7.43/50/81 CTA Chest 12/16/15 >neg PE, stable scarring in lingular/LUL.  Echo 08/2016 > LVEF 65-70% grade 1 diastolic dysfunction, RV normal size/function, no evidence of elevated PA pressure 2/27 Nicotine metabolites blood test > POSITIVE 3/7 CXR images personally reviewed showing patchy airspace disease left lung  CULTURES: 3/7 blood culture >   ANTIBIOTICS: 3/7 vanc >  3/7 zosyn >   SIGNIFICANT EVENTS:   LINES/TUBES:   DISCUSSION: 41 year old female with chronic respiratory failure with hypoxemia and hypercapnia, severe airflow obstruction on lung function testing seen in 2013, mildly elevated PA pressures seen on February 2017 right heart catheterization with normal wedge pressure was admitted on 02/09/2017 with HCAP.  During her last hospitalization we found that her nicotine metabolite level was elevated, consistent with tobacco abuse.  As of  this morning her oxygenation is stable and she is feeling a bit better.   ASSESSMENT / PLAN:  PULMONARY A: HCAP Acute on chronic respiratory failure with hypoxemia Severe COPD, possible mild exacerbation Continued Tobacco abuse Obesity hypoventilation, chronic respiratory failure with hypercapnea P:   Continue solumedrol IV, daily Continue brovana and pulmicort here Duoneb a6h Stop vancomycin BIPAP qHS Move to floor Ambulate  Will follow   Heber CarolinaBrent Chaniya Genter, MD [facility] Pager: [phone] Cell: [phone] After 3pm or if no  response, call [phone]   02/10/2017, 11:15 AM

## 2017-02-10 NOTE — Progress Notes (Signed)
RT NOTE:  Pt will put BIPAP on when she is ready.

## 2017-02-10 NOTE — Progress Notes (Signed)
Inpatient Diabetes Program Recommendations  AACE/ADA: New Consensus Statement on Inpatient Glycemic Control (2015)  Target Ranges:  Prepandial:   less than 140 mg/dL      Peak postprandial:   less than 180 mg/dL (1-2 hours)      Critically ill patients:  140 - 180 mg/dL   Lab Results  Component Value Date   GLUCAP 345 (H) 02/10/2017   HGBA1C 6.3 (H) 02/01/2017    Review of Glycemic Control  Blood sugars in 300s.  On Solumedrol 60 mg Q6H. HgbA1C indicates good control at home. Lantus increased to 15 units QAM, beginning 3/9.  Inpatient Diabetes Program Recommendations:    Add Novolog 4 units tidwc for meal coverage insulin if pt eats > 50% meal.  Will continue to follow. Thank you. Ailene Ardshonda Legrand Lasser, RD, LDN, CDE Inpatient Diabetes Coordinator [phone]

## 2017-02-11 LAB — GLUCOSE, CAPILLARY
GLUCOSE-CAPILLARY: 293 mg/dL — AB (ref 65–99)
Glucose-Capillary: 375 mg/dL — ABNORMAL HIGH (ref 65–99)
Glucose-Capillary: 332 mg/dL — ABNORMAL HIGH (ref 65–99)

## 2017-02-11 LAB — PROCALCITONIN: Procalcitonin: <0.1 ng/mL

## 2017-02-11 MED ORDER — GUAIFENESIN ER 600 MG PO TB12: 600.0000 mg | ORAL_TABLET | Freq: Two times a day (BID) | ORAL | 0 refills | Status: DC

## 2017-02-11 MED ORDER — AMOXICILLIN-POT CLAVULANATE 875-125 MG PO TABS: 1.0000 | ORAL_TABLET | Freq: Two times a day (BID) | ORAL | 0 refills | Status: DC

## 2017-02-11 MED ORDER — SACCHAROMYCES BOULARDII 250 MG PO CAPS: 250.0000 mg | ORAL_CAPSULE | Freq: Two times a day (BID) | ORAL | 0 refills | Status: DC

## 2017-02-11 MED ORDER — PREDNISONE 20 MG PO TABS: ORAL_TABLET | ORAL | 0 refills | Status: DC

## 2017-02-11 MED ORDER — PREDNISONE 5 MG PO TABS: ORAL_TABLET | ORAL | Status: DC

## 2017-02-11 NOTE — Progress Notes (Signed)
Results for Mary [name], Mary [name] (MRN [id number]) as of 02/11/2017 14:07  Ref. Range 02/10/2017 11:37 02/10/2017 17:19 02/10/2017 21:11 02/11/2017 07:33 02/11/2017 11:33  Glucose-Capillary Latest Ref Range: 65 - 99 mg/dL 409361 (H) 811345 (H) 914357 (H) 293 (H) 375 (H)  Noted that blood sugars continue to be greater than 250 mg/dl.  Recommend increasing Novolog correction scale to RESISTANT TID & HS and adding Novolog 4 units TID with meals and while on steroids.  Smith MinceKendra Masako Overall RN BSN CDE

## 2017-02-11 NOTE — Progress Notes (Signed)
Pharmacy Antibiotic Note  Mary HakimMichelle L Stancil is a 41 y.o. female with hx COPD [facility], presented to the ED on 02/09/17 with c/o SOB, and cough.  CXR showed patchy airspace opacity in the left base. Pt initially started on vancomycin and zosyn for suspected PNA and then de-escalated to zosyn only on 3/7  -  wbc 12.8, scr 0.49 (crcl~98)   Plan: - Zosyn 3.375 gm IV x1 over 30 min, then 3.375 gm IV q8h (infuse over 4 hours) -pharmacy will sign off as dose is appropriate for indication and renal function - please re-consult if needed ___________________________  Height: 5\' 1"  (154.9 cm) Weight: 205 lb 0.4 oz (93 kg) IBW/kg (Calculated) : 47.8  Temp (24hrs), Avg:97.9 F (36.6 C), Min:97.8 F (36.6 C), Max:98 F (36.7 C)   Recent Labs Lab 02/09/17 0340 02/09/17 0621 02/10/17 0346  WBC 14.0*  --  12.8*  CREATININE 0.67  --  0.49  LATICACIDVEN  --  1.8  --     Estimated Creatinine Clearance: 98.2 mL/min (by C-G formula based on SCr of 0.49 mg/dL).    No Known Allergies   Thank you for allowing pharmacy to be a part of this patient's care.  Arley Phenixllen Muranda Coye RPh 02/11/2017, 12:51 PM Pager [phone]

## 2017-02-11 NOTE — Progress Notes (Signed)
[facility]  Discussed with Dr. Gwenlyn PerkingMadera Patient to be d/c home today Agree with prednisone taper, antibiotics She already has f/u with pulmonary, she can keep that appointment  Heber CarolinaBrent Doral Ventrella, MD [facility] Pager: [phone] Cell: [phone] After 3pm or if no response, call [phone]

## 2017-02-11 NOTE — Discharge Summary (Signed)
Physician Discharge Summary  Jana HakimMichelle L Farrier ZOX:[id number] DOB: 06/04/1976 DOA: 02/09/2017  PCP: Jeanine Luzalone, Gregory, FNP  Admit date: 02/09/2017 Discharge date: 02/11/2017  Time spent: 35 minutes  Recommendations for Outpatient Follow-up:  1. Repeat BMET to follow electrolytes and renal function  2. Check A1C and follow CBG's; patient with hyperglycemia from steroids; but at risk for DM.   Discharge Diagnoses:  Principal Problem:   COPD exacerbation (HCC) Active Problems:   OSA on BiPAP   Hypokalemia   Esophageal reflux   Morbid obesity (HCC)   Chronic diastolic heart failure (HCC)   Diabetes mellitus type 2 in obese (HCC)   Acute on chronic respiratory failure with hypoxia and hypercapnia (HCC)   Healthcare-associated pneumonia   Discharge Condition: stable and improved. Discharge home with instructions to follow up with PCP in 2 weeks and with pulmonary service as instructed/scheduled.  Diet recommendation: low calorie/carbohydrates diet, heart healthy diet   Filed Weights   02/09/17 0329 02/10/17 0618 02/11/17 0300  Weight: 92.5 kg (204 lb) 94.9 kg (209 lb 3.5 oz) 93 kg (205 lb 0.4 oz)    Brief History of present illness:  41 y/o female with morbid obesity, chronic resp failure due to COPD, OHS/OSA (chronically on BIPAP at night and 2.5-3 L [place] throughout the day), DM type 2 on insulin, chronic diastolic HF and recent hospitalization for COPD exacerbation; presented due to worsening dyspnea and found to have HCAP.  Refer to H&P written by Dr. Waymon AmatoHongalgi on 02/09/17 for further info/details on admission.  Hospital Course:  1-acute on chronic resp failure: with hypoxia -due to HCAP and COPD exacerbation -significant improvement and resp status back to baseline at discharge -will discharge on augmentin and will continue steroids tapering -continue QHS BIPAP -continue home oxygen supplementation -follow cultures and rec's from pulmonary service  2-COPD exacerbation -continue  neb therapy, flutter valve and steroids tapering -will continue home inhaler/nebulizer regimen -continue oxygen supplementation   3-diabetes with hyperglycemia: most likely from steroids and infection -will continue SSI and lantus; last one adjusted for better control  4-OSA/OHS -continue BIPAP QHS -low calorie diet and weight loss discussed with patient  5-hypokalemia: -repleted and on maintenance supplementation at discharge  6-chronic diastolic heart failure -no crackles on exam, no LE edema -follow heart healthy diet and daily weights -compensated currently -continue daily Demadex   7-GERD -continue PPI  8-morbid obesity -Body mass index is 39.53 kg/m. -low calorie diet discussed with patient   9-alcohol use and tobacco use -cessation/abstinence discussed with patient   -Child psychotherapistsocial worker provided outpatient resources for assistance quitting    10- hyperglycemia -due to steroids -will recommend checking a1C as an outpatient and following CBG's once steroid therapy completed.   Procedures: See below for x-ray reports   Consultations:  PCCM   Discharge Exam: Vitals:   02/11/17 0300 02/11/17 1409  BP: 93/68 106/60  Pulse: 69 75  Resp: 16 18  Temp: 97.8 F (36.6 C) 97.8 F (36.6 C)     General:  Afebrile currently. No CP, endorses still some SOB (even much better than when she came in). On my exam patient was on 3 L [place], great O2 sat, speaking in full sentences.  Cardiovascular: S1 and S2, no rubs, no gallops, no JVD seen   Respiratory: improved air movement, decrease BS at bases, no significant exp wheezing appreciated.  Abdomen: soft, NT, ND, positive BS, obese  Musculoskeletal: no edema, no cyanosis, good muscle tone and FROM   Discharge Instructions   Discharge  Instructions    Diet - low sodium heart healthy    Complete by:  As directed    Discharge instructions    Complete by:  As directed    Take medications as prescribed  Stop  smoking Stop alcohol use Maintain adequate hydration Follow heart healthy and low calorie diet  Check Daily weights Follow up with pulmonary service (as previously instructed; contact office if needed for appointment details) Arrange follow up with PCP in 2 weeks     Current Discharge Medication List    START taking these medications   Details  amoxicillin-clavulanate (AUGMENTIN) 875-125 MG tablet Take 1 tablet by mouth 2 (two) times daily. Qty: 14 tablet, Refills: 0    guaiFENesin (MUCINEX) 600 MG 12 hr tablet Take 1 tablet (600 mg total) by mouth 2 (two) times daily. Qty: 40 tablet, Refills: 0    saccharomyces boulardii (FLORASTOR) 250 MG capsule Take 1 capsule (250 mg total) by mouth 2 (two) times daily. Qty: 30 capsule, Refills: 0      CONTINUE these medications which have CHANGED   Details  !! predniSONE (DELTASONE) 20 MG tablet Take 3 tab daily X1 days; then 2 tablet by mouth daily X 2 days; then resume prior tapering instructions and prednisone dose. Qty: 7 tablet, Refills: 0    !! predniSONE (DELTASONE) 5 MG tablet To be taken in 3 days. (Take  20 mg (4 Tab) qam with breakfast 1 week, then take 10 mg (2 tab) qam with breakfast 1 week, then 5 mg daily indefinitely)     !! - Potential duplicate medications found. Please discuss with provider.    CONTINUE these medications which have NOT CHANGED   Details  albuterol (PROVENTIL HFA;VENTOLIN HFA) 108 (90 BASE) MCG/ACT inhaler Inhale 2 puffs into the lungs every 6 (six) hours as needed for wheezing or shortness of breath. Qty: 8.5 g, Refills: 1    BREO ELLIPTA 100-25 MCG/INH AEPB INHALE 1 PUFF INTO THE LUNGS DAILY. Qty: 60 each, Refills: 3    gabapentin (NEURONTIN) 300 MG capsule TAKE 2 CAPSULES BY MOUTH 3 TIMES DAILY. Qty: 180 capsule, Refills: 2   Associated Diagnoses: Diabetic polyneuropathy associated with type 2 diabetes mellitus (HCC)    glipiZIDE (GLUCOTROL) 5 MG tablet Take 1 tablet (5 mg total) by mouth daily  before breakfast. For one week ONLY while on high-dose prednisone Qty: 7 tablet, Refills: 0    LYRICA 50 MG capsule TAKE 1 CAPSULE BY MOUTH 3 TIMES DAILY Qty: 90 capsule, Refills: 1    pantoprazole (PROTONIX) 40 MG tablet Take 1 tablet (40 mg total) by mouth 2 (two) times daily before a meal. Qty: 60 tablet, Refills: 0    potassium chloride SA (K-DUR,KLOR-CON) 20 MEQ tablet Take 1 tablet (20 mEq total) by mouth daily. Qty: 30 tablet, Refills: 6    roflumilast (DALIRESP) 500 MCG TABS tablet Take 500 mcg by mouth daily.    SPIRIVA RESPIMAT 1.25 MCG/ACT AERS INHALE 2 PUFFS INTO THE LUNGS 2 (TWO) TIMES DAILY. Qty: 1 Inhaler, Refills: 3    torsemide (DEMADEX) 20 MG tablet Take 1 tablet (20 mg total) by mouth daily. Qty: 90 tablet, Refills: 0    traMADol (ULTRAM) 50 MG tablet TAKE 1 TABLET BY MOUTH EVERY 6 HOURS AS NEEDED FOR SEVERE PAIN Qty: 120 tablet, Refills: 0   Associated Diagnoses: Diabetic polyneuropathy associated with type 2 diabetes mellitus (HCC)       No Known Allergies Follow-up Information    Jeanine Luz, FNP. Schedule  an appointment as soon as possible for a visit in 2 week(s).   Specialty:  Family Medicine Contact information: [address] [facility] [place] [address] [phone]           The results of significant diagnostics from this hospitalization (including imaging, microbiology, ancillary and laboratory) are listed below for reference.    Significant Diagnostic Studies: Ct Chest High Resolution  Result Date: 02/02/2017 CLINICAL DATA:  COPD, oxygen dependency, recent prednisone therapy, 7 shortness of breath. EXAM: CT CHEST WITHOUT CONTRAST TECHNIQUE: Multidetector CT imaging of the chest was performed following the standard protocol without intravenous contrast. High resolution imaging of the lungs, as well as inspiratory and expiratory imaging, was performed. COMPARISON:  CT chest exams dating back 12/27/2013. FINDINGS: Cardiovascular: Pulmonary  arteries and heart are enlarged. No pericardial effusion. Mediastinum/Nodes: No pathologically enlarged mediastinal or axillary lymph nodes. Hilar regions are difficult to evaluate without IV contrast but appear grossly unremarkable. Esophagus is grossly unremarkable. Lungs/Pleura: Mosaic pulmonary parenchymal attenuation with scattered pulmonary parenchymal scarring and mild architectural distortion. No subpleural reticulation, traction bronchiectasis/bronchiolectasis, ground-glass or honeycombing. Focal area of ground-glass in the posterior segment right upper lobe measures 2.1 cm, stable from 01/13/2015 but minimally enlarged from 12/27/2013, at which time it measured approximately 1.7 cm. No solid components. No pleural fluid. Airway is unremarkable. Upper Abdomen: Liver appears enlarged enlarged and diffusely decreased in attenuation but is incompletely imaged. Visualized portions of the gallbladder, adrenal glands, right kidney unremarkable. Punctate stone left kidney. Visualized portions of the spleen, pancreas, stomach and bowel are grossly unremarkable. No upper abdominal adenopathy. Musculoskeletal: No worrisome lytic or sclerotic lesions. IMPRESSION: 1. Scattered pulmonary parenchymal scarring with mosaic pulmonary parenchymal attenuation and mild architectural distortion, suggestive of obliterative bronchiolitis. 2. No evidence of fibrotic interstitial lung disease. 3. Enlarged pulmonary arteries, indicative of pulmonary arterial hypertension. 4. Right upper lobe ground-glass nodule, stable from 01/18/2015 but minimally slightly enlarged from 12/27/2013. Additional follow-up could be obtained in 2 years to ensure continued stability. This recommendation follows the consensus statement: Guidelines for Management of Small Pulmonary Nodules Detected on CT Images: From the Fleischner Society 2017; Radiology 2017; 284:228-243. 5. Fatty enlarged liver. 6. Left renal stone. Electronically Signed   By: Leanna Battles M.D.   On: 02/02/2017 08:21   Dg Chest Port 1 View  Result Date: 02/09/2017 CLINICAL DATA:  Sudden onset dyspnea. EXAM: PORTABLE CHEST 1 VIEW COMPARISON:  02/01/2017, 01/31/2017. FINDINGS: Patchy airspace opacity in the left lung base could represent pneumonia. Right lung is clear. No large effusions. Pulmonary vasculature is normal. IMPRESSION: Patchy airspace opacity in the left base. Cannot exclude pneumonia. Followup PA and lateral chest X-ray is recommended in 3-4 weeks following trial of antibiotic therapy to ensure resolution and exclude underlying malignancy. Electronically Signed   By: Ellery Plunk M.D.   On: 02/09/2017 04:30   Dg Chest Port 1 View  Result Date: 01/31/2017 CLINICAL DATA:  Shortness of breath. EXAM: PORTABLE CHEST 1 VIEW COMPARISON:  01/11/2017.  08/25/2016.  07/18/2016.  11/13/2015. FINDINGS: Mediastinum and hilar structures are stable . Stable cardiomegaly. Mild basilar interstitial prominence. Small right pleural effusion. Left costophrenic angle not imaged. Mild CHF cannot be excluded. Left base pleural-parenchymal thickening again noted consistent scarring. IMPRESSION: 1. Cardiomegaly with mild basilar interstitial prominence of small right pleural effusion. Mild CHF cannot be excluded. 2. Left base pleuroparenchymal thickening again noted consistent with scarring. Electronically Signed   By: Maisie Fus  Register   On: 01/31/2017 17:31   Dg Swallowing Func-speech  Pathology  Result Date: 02/02/2017 Objective Swallowing Evaluation: Type of Study: MBS-Modified Barium Swallow Study Patient Details Name: RANEEN JAFFER MRN: [id number] Date of Birth: 12-07-1975 Today's Date: 02/02/2017 Time: SLP Start Time (ACUTE ONLY): 1135-SLP Stop Time (ACUTE ONLY): 1155 SLP Time Calculation (min) (ACUTE ONLY): 20 min Past Medical History: Past Medical History: Diagnosis Date . Axillary adenopathy   right axillary adenopathy noted on CT chest (03/06/2012) . Chronic right-sided CHF  (congestive heart failure) 03/23/2012  with cor pulmonale. Last RHC 03/2012 . COPD (chronic obstructive pulmonary disease) (HCC)   PFTS 03/08/12: fev1 0.58L/1%, FVC 1.18/33%, Ratop 49 and c/w ssevere obstruction. 21% BD response on FVC, RV 219%, DLCO 11/54% . DVT of upper extremity (deep vein thrombosis) [facility]) April 2013  right subclavian // Unclear precipitating cause - possibly significant right heart failure, with vascular stasis potentially predisposing to clotting.   . Exertional shortness of breath  . Hepatic cirrhosis (HCC)   Questionable history of - Noted on CT abdomen (03/2012) - thought to be due to vascular congestion from right heart failure +/- patient's history of alcohol abuse . History of alcoholism (HCC)  . History of chronic bronchitis  . Irregular menses  . Migraine   "~ 1/yr" (05/03/2013) . Moderate to severe pulmonary hypertension April 2013  Cardiac cath on 03/06/12 - 1. Elevated pulmonary artery pressures, right sided filling pressures.,  2. PA: 64/45 (mean 53)   . On home oxygen therapy   "2-3 L 24/7" (05/03/2013) . OSA (obstructive sleep apnea)   wears noctural BiPAP (05/03/2013) . Periodontal disease  . Pneumonia ~ 1985; 01/2011 . Pulmonary embolus [facility]) April 2013  Precipitating cause unclear. Was treated with coumadin from April-June 2013. Past Surgical History: Past Surgical History: Procedure Laterality Date . APPENDECTOMY  05/03/2013 . CARDIAC CATHETERIZATION  03/2012 . CARDIAC CATHETERIZATION N/A 01/08/2016  Procedure: Right Heart Cath;  Surgeon: Dolores Patty, MD;  Location: [facility] INVASIVE CV LAB;  Service: Cardiovascular;  Laterality: N/A; . CARDIAC SURGERY  1977-07[birth year]  "my heart was backwards" (05/03/2013) . LAPAROSCOPIC APPENDECTOMY N/A 05/03/2013  Procedure: APPENDECTOMY LAPAROSCOPIC;  Surgeon: Cherylynn Ridges, MD;  Location: [facility] OR;  Service: General;  Laterality: N/A; . LUNG SURGERY  10/04/77 . RIGHT HEART CATHETERIZATION N/A 03/07/2012  Procedure: RIGHT HEART CATH;  Surgeon: Kathleene Hazel, MD;  Location: [facility] CATH LAB;  Service: Cardiovascular;  Laterality: N/A; HPI: 41 year old female admitted 01/31/17 with acute on chronic respiratory failure with hypoxia. PMH significant for COPD, ETOH abuse, DVT, PE, HTN, chronic bronchitis, PNA, OSA, migraines, CHF, SOB.  Subjective: Pt seen in radiology for MBS to objectively assess swallow function and safety Assessment / Plan / Recommendation CHL IP CLINICAL IMPRESSIONS 02/02/2017 Clinical Impression Pt presents with normal oral, mild pharyngeal dysphagia, as well as some degree of esophageal dysmotility. Pharyngeal swallow is characterized by delayed swallow reflex on puree and solid consistencies. No penetration, aspiration, post-swallow residue, or backflow to the pharynx was noted on any consistency. The barium tablet was given with a sip of water, and while it cleared the oropharynx without difficulty or delay, esophageal sweep revealed it paused in the mid-thoracic region. Several minutes and multiple boluses of liquid were given to assist in clearing the pill. Recommend continuing with regular diet and thin liquids, following precautions for management of esophageal dysmotility (which were provided and reviewed with pt). Also recommend taking meds one at a time, and following large pills with additional boluses of water and/or allowing extra time between pills.  SLP  Visit Diagnosis Dysphagia, pharyngoesophageal phase (R13.14) Impact on safety and function Mild aspiration risk   CHL IP TREATMENT RECOMMENDATION 02/02/2017 Treatment Recommendations No treatment recommended at this time. Education completed in radiology immediately following this study.   Prognosis 02/02/2017 Prognosis for Safe Diet Advancement Good CHL IP DIET RECOMMENDATION 02/02/2017 SLP Diet Recommendations Regular solids;Thin liquid Liquid Administration via Cup;Straw Medication Administration Whole meds with puree one at a time, following large pills with several boluses of water.  Compensations Minimize environmental distractions;Slow rate;Small sips/bites;Follow solids with liquid Postural Changes Seated upright at 90 degrees; Remain semi-upright after after feeds/meals    CHL IP OTHER RECOMMENDATIONS 02/02/2017 Oral Care Recommendations Oral care BID    CHL IP ORAL PHASE 02/02/2017 Oral Phase WFL  CHL IP PHARYNGEAL PHASE 02/02/2017 Pharyngeal Phase Impaired Pharyngeal- Thin Cup WFL Pharyngeal- Thin Straw WFL Pharyngeal- Puree Delayed swallow initiation-vallecula Pharyngeal- Regular Delayed swallow initiation-vallecula Pharyngeal- Pill WFL  CHL IP CERVICAL ESOPHAGEAL PHASE 02/02/2017 Cervical Esophageal Phase Impaired Pill Barium tablet cleared the oropharynx without delay or difficulty. Upon esophageal sweep, pill was noted to be stopped at the mid-thoracic region. Several minutes and multiple boluses of thin liquid were required to clear barium tablet. Celia B. Steward, [facility], CCC-SLP [phone] Leigh [place] 02/02/2017, 12:52 PM               Microbiology: Recent Results (from the past 240 hour(s))  Culture, blood (routine x 2)     Status: None (Preliminary result)   Collection Time: 02/09/17  6:30 AM  Result Value Ref Range Status   Specimen Description BLOOD RIGHT ANTECUBITAL  Final   Special Requests BOTTLES DRAWN AEROBIC AND ANAEROBIC 10CC  Final   Culture   Final    NO GROWTH 2 DAYS Performed at [facility], [address] [place], [place] [address]    Report Status PENDING  Incomplete  Culture, blood (routine x 2)     Status: None (Preliminary result)   Collection Time: 02/09/17  6:41 AM  Result Value Ref Range Status   Specimen Description BLOOD LEFT HAND  Final   Special Requests BOTTLES DRAWN AEROBIC AND ANAEROBIC 10CC  Final   Culture   Final    NO GROWTH 2 DAYS Performed at [facility], [address] [place], [place] [address]    Report Status PENDING  Incomplete  MRSA PCR Screening     Status: None   Collection Time: 02/09/17 12:07 PM   Result Value Ref Range Status   MRSA by PCR NEGATIVE NEGATIVE Final    Comment:        The GeneXpert MRSA Assay (FDA approved for NASAL specimens only), is one component of a comprehensive MRSA colonization surveillance program. It is not intended to diagnose MRSA infection nor to guide or monitor treatment for MRSA infections.      Labs: Basic Metabolic Panel:  Recent Labs Lab 02/09/17 0340 02/10/17 0346  NA 137 135  K 3.4* 4.5  CL 85* 90*  CO2 42* 37*  GLUCOSE 210* 307*  BUN 13 11  CREATININE 0.67 0.49  CALCIUM 9.3 9.5   CBC:  Recent Labs Lab 02/09/17 0340 02/10/17 0346  WBC 14.0* 12.8*  HGB 14.6 12.5  HCT 46.6* 38.7  MCV 101.3* 99.5  PLT 169 159   Cardiac Enzymes:  Recent Labs Lab 02/09/17 0340  TROPONINI <0.03   BNP: BNP (last 3 results)  Recent Labs  07/18/16 0400 08/25/16 0713 02/09/17 0340  BNP 100.3* 23.4 104.2*  ProBNP (last 3 results)  Recent Labs  04/20/16 1703  PROBNP 30.0    CBG:  Recent Labs Lab 02/10/17 1137 02/10/17 1719 02/10/17 2111 02/11/17 0733 02/11/17 1133  GLUCAP 361* 345* 357* 293* 375*    Signed:  Vassie Loll MD.  [facility] 02/11/2017, 3:43 PM

## 2017-02-13 ENCOUNTER — Encounter (HOSPITAL_COMMUNITY): Payer: Self-pay | Admitting: Emergency Medicine

## 2017-02-13 ENCOUNTER — Inpatient Hospital Stay (HOSPITAL_COMMUNITY)
Admission: EM | Admit: 2017-02-13 | Discharge: 2017-02-15 | DRG: 868 | Disposition: A | Discharge status: Home / Self Care | Payer: 59 | Attending: Internal Medicine | Admitting: Internal Medicine

## 2017-02-13 DIAGNOSIS — K219 Gastro-esophageal reflux disease without esophagitis: Secondary | ICD-10-CM | POA: Diagnosis not present

## 2017-02-13 DIAGNOSIS — Z87891 Personal history of nicotine dependence: Secondary | ICD-10-CM

## 2017-02-13 DIAGNOSIS — E669 Obesity, unspecified: Secondary | ICD-10-CM | POA: Diagnosis not present

## 2017-02-13 DIAGNOSIS — Z79899 Other long term (current) drug therapy: Secondary | ICD-10-CM | POA: Diagnosis not present

## 2017-02-13 DIAGNOSIS — I5032 Chronic diastolic (congestive) heart failure: Secondary | ICD-10-CM | POA: Diagnosis not present

## 2017-02-13 DIAGNOSIS — G4733 Obstructive sleep apnea (adult) (pediatric): Secondary | ICD-10-CM | POA: Diagnosis present

## 2017-02-13 DIAGNOSIS — Z8249 Family history of ischemic heart disease and other diseases of the circulatory system: Secondary | ICD-10-CM

## 2017-02-13 DIAGNOSIS — Z86718 Personal history of other venous thrombosis and embolism: Secondary | ICD-10-CM

## 2017-02-13 DIAGNOSIS — B49 Unspecified mycosis: Secondary | ICD-10-CM | POA: Diagnosis not present

## 2017-02-13 DIAGNOSIS — Z794 Long term (current) use of insulin: Secondary | ICD-10-CM

## 2017-02-13 DIAGNOSIS — J9611 Chronic respiratory failure with hypoxia: Secondary | ICD-10-CM | POA: Diagnosis not present

## 2017-02-13 DIAGNOSIS — Z7984 Long term (current) use of oral hypoglycemic drugs: Secondary | ICD-10-CM

## 2017-02-13 DIAGNOSIS — D72829 Elevated white blood cell count, unspecified: Secondary | ICD-10-CM | POA: Diagnosis present

## 2017-02-13 DIAGNOSIS — E1169 Type 2 diabetes mellitus with other specified complication: Secondary | ICD-10-CM | POA: Diagnosis not present

## 2017-02-13 DIAGNOSIS — B488 Other specified mycoses: Principal | ICD-10-CM | POA: Diagnosis present

## 2017-02-13 DIAGNOSIS — Z6836 Body mass index (BMI) 36.0-36.9, adult: Secondary | ICD-10-CM | POA: Diagnosis not present

## 2017-02-13 DIAGNOSIS — E1140 Type 2 diabetes mellitus with diabetic neuropathy, unspecified: Secondary | ICD-10-CM | POA: Diagnosis present

## 2017-02-13 DIAGNOSIS — Z7951 Long term (current) use of inhaled steroids: Secondary | ICD-10-CM

## 2017-02-13 DIAGNOSIS — J449 Chronic obstructive pulmonary disease, unspecified: Secondary | ICD-10-CM | POA: Diagnosis present

## 2017-02-13 DIAGNOSIS — Z7952 Long term (current) use of systemic steroids: Secondary | ICD-10-CM | POA: Diagnosis not present

## 2017-02-13 DIAGNOSIS — E1165 Type 2 diabetes mellitus with hyperglycemia: Secondary | ICD-10-CM | POA: Diagnosis present

## 2017-02-13 DIAGNOSIS — J441 Chronic obstructive pulmonary disease with (acute) exacerbation: Secondary | ICD-10-CM | POA: Diagnosis present

## 2017-02-13 DIAGNOSIS — Z86711 Personal history of pulmonary embolism: Secondary | ICD-10-CM | POA: Diagnosis not present

## 2017-02-13 DIAGNOSIS — Z818 Family history of other mental and behavioral disorders: Secondary | ICD-10-CM

## 2017-02-13 DIAGNOSIS — T380X5A Adverse effect of glucocorticoids and synthetic analogues, initial encounter: Secondary | ICD-10-CM | POA: Diagnosis present

## 2017-02-13 DIAGNOSIS — I272 Pulmonary hypertension, unspecified: Secondary | ICD-10-CM | POA: Diagnosis present

## 2017-02-13 DIAGNOSIS — F1010 Alcohol abuse, uncomplicated: Secondary | ICD-10-CM | POA: Diagnosis not present

## 2017-02-13 DIAGNOSIS — J9612 Chronic respiratory failure with hypercapnia: Secondary | ICD-10-CM | POA: Diagnosis present

## 2017-02-13 DIAGNOSIS — Z9981 Dependence on supplemental oxygen: Secondary | ICD-10-CM | POA: Diagnosis not present

## 2017-02-13 HISTORY — DX: Pulmonary hypertension, unspecified: I27.20

## 2017-02-13 LAB — GLUCOSE, CAPILLARY
Glucose-Capillary: 180 mg/dL — ABNORMAL HIGH (ref 65–99)
Glucose-Capillary: 262 mg/dL — ABNORMAL HIGH (ref 65–99)

## 2017-02-13 LAB — BLOOD CULTURE ID PANEL (REFLEXED)
Acinetobacter baumannii: NOT DETECTED
CANDIDA GLABRATA: NOT DETECTED
Candida albicans: NOT DETECTED
Candida krusei: NOT DETECTED
Candida parapsilosis: NOT DETECTED
Candida tropicalis: NOT DETECTED
ENTEROBACTER CLOACAE COMPLEX: NOT DETECTED
ENTEROBACTERIACEAE SPECIES: NOT DETECTED
ENTEROCOCCUS SPECIES: NOT DETECTED
Escherichia coli: NOT DETECTED
Haemophilus influenzae: NOT DETECTED
Klebsiella oxytoca: NOT DETECTED
Klebsiella pneumoniae: NOT DETECTED
Listeria monocytogenes: NOT DETECTED
NEISSERIA MENINGITIDIS: NOT DETECTED
Proteus species: NOT DETECTED
Pseudomonas aeruginosa: NOT DETECTED
STAPHYLOCOCCUS SPECIES: NOT DETECTED
STREPTOCOCCUS PYOGENES: NOT DETECTED
Serratia marcescens: NOT DETECTED
Staphylococcus aureus (BCID): NOT DETECTED
Streptococcus agalactiae: NOT DETECTED
Streptococcus pneumoniae: NOT DETECTED
Streptococcus species: NOT DETECTED

## 2017-02-13 LAB — CBC WITH DIFFERENTIAL/PLATELET
Basophils Absolute: 0 10*3/uL (ref 0.0–0.1)
Basophils Relative: 0 %
EOS ABS: 0.1 10*3/uL (ref 0.0–0.7)
EOS PCT: 1 %
HCT: 42.4 % (ref 36.0–46.0)
Hemoglobin: 13.6 g/dL (ref 12.0–15.0)
Lymphocytes Relative: 7 %
Lymphs Abs: 1.2 10*3/uL (ref 0.7–4.0)
MCH: 31.9 pg (ref 26.0–34.0)
MCHC: 32.1 g/dL (ref 30.0–36.0)
MCV: 99.5 fL (ref 78.0–100.0)
Monocytes Absolute: 0.6 10*3/uL (ref 0.1–1.0)
Monocytes Relative: 4 %
Neutro Abs: 14.1 10*3/uL — ABNORMAL HIGH (ref 1.7–7.7)
Neutrophils Relative %: 88 %
PLATELETS: 155 10*3/uL (ref 150–400)
RBC: 4.26 MIL/uL (ref 3.87–5.11)
RDW: 13.7 % (ref 11.5–15.5)
WBC: 15.9 10*3/uL — AB (ref 4.0–10.5)

## 2017-02-13 LAB — BASIC METABOLIC PANEL
Anion gap: 8 (ref 5–15)
BUN: 12 mg/dL (ref 6–20)
CALCIUM: 8.9 mg/dL (ref 8.9–10.3)
CO2: 34 mmol/L — ABNORMAL HIGH (ref 22–32)
CREATININE: 0.51 mg/dL (ref 0.44–1.00)
Chloride: 95 mmol/L — ABNORMAL LOW (ref 101–111)
GFR calc Af Amer: >60 mL/min (ref >60)
Glucose, Bld: 91 mg/dL (ref 65–99)
Potassium: 3.8 mmol/L (ref 3.5–5.1)
SODIUM: 137 mmol/L (ref 135–145)

## 2017-02-13 MED ORDER — INSULIN ASPART 100 UNIT/ML ~~LOC~~ SOLN
0.0000 [IU] | Freq: Every day | SUBCUTANEOUS | Status: DC
  Administered 2017-02-13: 3 [IU] via SUBCUTANEOUS

## 2017-02-13 MED ORDER — GABAPENTIN 300 MG PO CAPS
300.0000 mg | ORAL_CAPSULE | Freq: Three times a day (TID) | ORAL | Status: DC
  Administered 2017-02-13 – 2017-02-15 (×7): 300 mg via ORAL
  Filled 2017-02-13 (×7): qty 1

## 2017-02-13 MED ORDER — AMOXICILLIN-POT CLAVULANATE 875-125 MG PO TABS
1.0000 | ORAL_TABLET | Freq: Once | ORAL | Status: AC
  Administered 2017-02-13: 1 via ORAL
  Filled 2017-02-13: qty 1

## 2017-02-13 MED ORDER — TORSEMIDE 20 MG PO TABS
20.0000 mg | ORAL_TABLET | Freq: Every day | ORAL | Status: DC
  Administered 2017-02-13 – 2017-02-15 (×3): 20 mg via ORAL
  Filled 2017-02-13 (×3): qty 1

## 2017-02-13 MED ORDER — ACETAMINOPHEN 650 MG RE SUPP: 650.0000 mg | Freq: Four times a day (QID) | RECTAL | Status: DC | PRN

## 2017-02-13 MED ORDER — ACETAMINOPHEN 325 MG PO TABS
650.0000 mg | ORAL_TABLET | Freq: Four times a day (QID) | ORAL | Status: DC | PRN
  Administered 2017-02-15: 650 mg via ORAL
  Filled 2017-02-13: qty 2

## 2017-02-13 MED ORDER — FLUTICASONE FUROATE-VILANTEROL 100-25 MCG/INH IN AEPB
1.0000 | INHALATION_SPRAY | Freq: Every day | RESPIRATORY_TRACT | Status: DC
  Administered 2017-02-13 – 2017-02-15 (×3): 1 via RESPIRATORY_TRACT
  Filled 2017-02-13: qty 28

## 2017-02-13 MED ORDER — ANIDULAFUNGIN 100 MG IV SOLR
100.0000 mg | INTRAVENOUS | Status: DC
  Administered 2017-02-14 – 2017-02-15 (×2): 100 mg via INTRAVENOUS
  Filled 2017-02-13 (×2): qty 100

## 2017-02-13 MED ORDER — POTASSIUM CHLORIDE CRYS ER 20 MEQ PO TBCR
20.0000 meq | EXTENDED_RELEASE_TABLET | Freq: Every day | ORAL | Status: DC
  Administered 2017-02-13 – 2017-02-15 (×3): 20 meq via ORAL
  Filled 2017-02-13 (×3): qty 1

## 2017-02-13 MED ORDER — SODIUM CHLORIDE 0.9% FLUSH
3.0000 mL | Freq: Two times a day (BID) | INTRAVENOUS | Status: DC
  Administered 2017-02-13 – 2017-02-15 (×3): 3 mL via INTRAVENOUS

## 2017-02-13 MED ORDER — SODIUM CHLORIDE 0.9 % IV SOLN
250.0000 mL | INTRAVENOUS | Status: DC | PRN
Start: 2017-02-13 — End: 2017-02-15

## 2017-02-13 MED ORDER — AMOXICILLIN-POT CLAVULANATE 875-125 MG PO TABS
1.0000 | ORAL_TABLET | Freq: Two times a day (BID) | ORAL | Status: DC
  Administered 2017-02-13 – 2017-02-15 (×4): 1 via ORAL
  Filled 2017-02-13 (×4): qty 1

## 2017-02-13 MED ORDER — PANTOPRAZOLE SODIUM 40 MG PO TBEC
40.0000 mg | DELAYED_RELEASE_TABLET | Freq: Two times a day (BID) | ORAL | Status: DC
  Administered 2017-02-13 – 2017-02-15 (×5): 40 mg via ORAL
  Filled 2017-02-13 (×5): qty 1

## 2017-02-13 MED ORDER — INSULIN ASPART 100 UNIT/ML ~~LOC~~ SOLN
0.0000 [IU] | Freq: Three times a day (TID) | SUBCUTANEOUS | Status: DC
  Administered 2017-02-13 – 2017-02-14 (×2): 4 [IU] via SUBCUTANEOUS
  Administered 2017-02-14: 7 [IU] via SUBCUTANEOUS
  Administered 2017-02-14: 11 [IU] via SUBCUTANEOUS
  Administered 2017-02-15: 7 [IU] via SUBCUTANEOUS
  Administered 2017-02-15: 11 [IU] via SUBCUTANEOUS

## 2017-02-13 MED ORDER — SODIUM CHLORIDE 0.9 % IV SOLN
200.0000 mg | Freq: Once | INTRAVENOUS | Status: AC
  Administered 2017-02-13: 200 mg via INTRAVENOUS
  Filled 2017-02-13: qty 200

## 2017-02-13 MED ORDER — SODIUM CHLORIDE 0.9 % IV SOLN: 20.0000 mg | INTRAVENOUS | Status: DC

## 2017-02-13 MED ORDER — ROFLUMILAST 500 MCG PO TABS
500.0000 ug | ORAL_TABLET | Freq: Every day | ORAL | Status: DC
  Administered 2017-02-13 – 2017-02-15 (×3): 500 ug via ORAL
  Filled 2017-02-13 (×3): qty 1

## 2017-02-13 MED ORDER — ALBUTEROL SULFATE (2.5 MG/3ML) 0.083% IN NEBU
2.5000 mg | INHALATION_SOLUTION | RESPIRATORY_TRACT | Status: DC | PRN
  Administered 2017-02-14: 2.5 mg via RESPIRATORY_TRACT
  Filled 2017-02-13: qty 3

## 2017-02-13 MED ORDER — TIOTROPIUM BROMIDE MONOHYDRATE 18 MCG IN CAPS
18.0000 ug | ORAL_CAPSULE | Freq: Every day | RESPIRATORY_TRACT | Status: DC
  Administered 2017-02-15: 18 ug via RESPIRATORY_TRACT
  Filled 2017-02-13: qty 5

## 2017-02-13 MED ORDER — PREGABALIN 50 MG PO CAPS
50.0000 mg | ORAL_CAPSULE | Freq: Three times a day (TID) | ORAL | Status: DC
  Administered 2017-02-13 – 2017-02-15 (×7): 50 mg via ORAL
  Filled 2017-02-13 (×7): qty 1

## 2017-02-13 MED ORDER — PREDNISONE 20 MG PO TABS
30.0000 mg | ORAL_TABLET | Freq: Every day | ORAL | Status: DC
  Administered 2017-02-14 – 2017-02-15 (×2): 30 mg via ORAL
  Filled 2017-02-13 (×2): qty 1

## 2017-02-13 MED ORDER — SODIUM CHLORIDE 0.9% FLUSH: 3.0000 mL | INTRAVENOUS | Status: DC | PRN

## 2017-02-13 MED ORDER — TRAMADOL HCL 50 MG PO TABS
50.0000 mg | ORAL_TABLET | Freq: Four times a day (QID) | ORAL | Status: DC | PRN
  Administered 2017-02-13 – 2017-02-15 (×7): 50 mg via ORAL
  Filled 2017-02-13 (×7): qty 1

## 2017-02-13 MED ORDER — GUAIFENESIN ER 600 MG PO TB12
600.0000 mg | ORAL_TABLET | Freq: Two times a day (BID) | ORAL | Status: DC
  Administered 2017-02-13 – 2017-02-15 (×4): 600 mg via ORAL
  Filled 2017-02-13 (×4): qty 1

## 2017-02-13 MED ORDER — ENOXAPARIN SODIUM 40 MG/0.4ML ~~LOC~~ SOLN
40.0000 mg | SUBCUTANEOUS | Status: DC
  Administered 2017-02-13 – 2017-02-14 (×2): 40 mg via SUBCUTANEOUS
  Filled 2017-02-13 (×3): qty 0.4

## 2017-02-13 NOTE — ED Provider Notes (Signed)
[facility]-EMERGENCY DEPT Provider Note   CSN: [id number] Arrival date & time: 02/13/17  1408     History   Chief Complaint Chief Complaint  Patient presents with  . Blood Infection    HPI Mary [name] is a 41 y.o. female.  HPI  40 year old female with a sniffing a past medical history as seen below presents to the emergency department secondary to positive blood cultures. Patient was recently admitted for a few days secondary to pneumonia appears that a blood culture from early in her admission grew yeast and 1 bottle. Patient feels overall drained but no acute complaints. She has not had a fever, nausea, vomiting no urinary symptoms. No rashes that she's noticed that are new. Overall she feels improved from her hospital stay but not necessarily normal. No other modifying or associated symptoms.  Past Medical History:  Diagnosis Date  . Axillary adenopathy    right axillary adenopathy noted on CT chest (03/06/2012)  . Chronic right-sided CHF (congestive heart failure) 03/23/2012   with cor pulmonale. Last RHC 03/2012  . COPD (chronic obstructive pulmonary disease) (HCC)    PFTS 03/08/12: fev1 0.58L/1%, FVC 1.18/33%, Ratop 49 and c/w ssevere obstruction. 21% BD response on FVC, RV 219%, DLCO 11/54%  . DVT of upper extremity (deep vein thrombosis) [facility]) April 2013   right subclavian // Unclear precipitating cause - possibly significant right heart failure, with vascular stasis potentially predisposing to clotting.    . Exertional shortness of breath   . Hepatic cirrhosis (HCC)    Questionable history of - Noted on CT abdomen (03/2012) - thought to be due to vascular congestion from right heart failure +/- patient's history of alcohol abuse  . History of alcoholism (HCC)   . History of chronic bronchitis   . Irregular menses   . Migraine    "~ 1/yr" (05/03/2013)  . Moderate to severe pulmonary hypertension April 2013   Cardiac cath on 03/06/12 - 1. Elevated pulmonary artery pressures,  right sided filling pressures.,  2. PA: 64/45 (mean 53)    . On home oxygen therapy    "2-3 L 24/7" (05/03/2013)  . OSA (obstructive sleep apnea)    wears noctural BiPAP (05/03/2013)  . Periodontal disease   . Pneumonia ~ 1985; 01/2011  . Pulmonary embolus [facility]) April 2013   Precipitating cause unclear. Was treated with coumadin from April-June 2013.  . Pulmonary hypertension     Patient Active Problem List   Diagnosis Date Noted  . Fungemia 02/13/2017  . COPD (chronic obstructive pulmonary disease) (HCC) 02/13/2017  . Chronic respiratory failure with hypoxia and hypercapnia (HCC) 02/13/2017  . Healthcare-associated pneumonia 02/09/2017  . Acute on chronic diastolic CHF (congestive heart failure) (HCC) 01/31/2017  . Alcohol abuse 01/31/2017  . Acute on chronic respiratory failure with hypoxia and hypercapnia (HCC)   . Tracheomalacia   . Palliative care by specialist   . Anxiety state   . Advance directive declined by patient   . Goals of care, counseling/discussion   . Chronic hoarseness 07/27/2016  . Diabetic neuropathy (HCC) 04/30/2016  . Diabetes mellitus type 2 in obese (HCC) 04/10/2016  . COPD exacerbation (HCC) 04/01/2016  . Chronic diastolic heart failure (HCC) 01/25/2016  . Chronic respiratory failure with hypoxia (HCC) 01/21/2016  . PAH (pulmonary artery hypertension)   . Constipation 11/18/2015  . Morbid obesity (HCC) 11/18/2015  . Lung nodule 01/30/2015  . Pulmonary mass 01/13/2015  . Gastroparesis   . Esophageal reflux   . Obstipation   .  Hypokalemia 02/28/2013  . Financial difficulties 02/28/2013  . Obesity hypoventilation syndrome (HCC) 02/28/2013  . Myalgia 02/28/2013  . Anovulation 06/30/2012  . Preventative health care 05/05/2012  . Irregular menstrual cycle 04/27/2012  . Chronic right-sided CHF (congestive heart failure) 03/23/2012  . Pulmonary hypertension 03/23/2012  . Cor pulmonale (HCC) 03/21/2012  . Chronic respiratory failure with hypercapnia  (HCC) 03/21/2012  . OSA on BiPAP 03/19/2012  . Axillary lymphadenopathy 03/06/2012  . Congenital heart disease 01/28/2012  . Dental caries 01/28/2012    Past Surgical History:  Procedure Laterality Date  . APPENDECTOMY  05/03/2013  . CARDIAC CATHETERIZATION  03/2012  . CARDIAC CATHETERIZATION N/A 01/08/2016   Procedure: Right Heart Cath;  Surgeon: Dolores Pattyaniel R Bensimhon, MD;  [facility];  Service: Cardiovascular;  Laterality: N/A;  . CARDIAC SURGERY  10/16/1977   "my heart was backwards" (05/03/2013)  . LAPAROSCOPIC APPENDECTOMY N/A 05/03/2013   Procedure: APPENDECTOMY LAPAROSCOPIC;  Surgeon: Cherylynn RidgesJames O Wyatt, MD;  [facility];  Service: General;  Laterality: N/A;  . LUNG SURGERY  10/16/1977  . RIGHT HEART CATHETERIZATION N/A 03/07/2012   Procedure: RIGHT HEART CATH;  Surgeon: Kathleene Hazelhristopher D McAlhany, MD;  [facility];  Service: Cardiovascular;  Laterality: N/A;    OB History    No data available       Home Medications    Prior to Admission medications   Medication Sig Start Date End Date Taking? Authorizing Provider  albuterol (PROVENTIL HFA;VENTOLIN HFA) 108 (90 BASE) MCG/ACT inhaler Inhale 2 puffs into the lungs every 6 (six) hours as needed for wheezing or shortness of breath. 09/06/14  Yes Kalman ShanMurali Ramaswamy, MD  BREO ELLIPTA 100-25 MCG/INH AEPB INHALE 1 PUFF INTO THE LUNGS DAILY. 06/03/16  Yes Kalman ShanMurali Ramaswamy, MD  gabapentin (NEURONTIN) 300 MG capsule TAKE 2 CAPSULES BY MOUTH 3 TIMES DAILY. 12/14/16  Yes Veryl SpeakGregory D Calone, FNP  glipiZIDE (GLUCOTROL) 5 MG tablet Take 1 tablet (5 mg total) by mouth daily before breakfast. For one week ONLY while on high-dose prednisone 02/04/17  Yes Courage Emokpae, MD  guaiFENesin (MUCINEX) 600 MG 12 hr tablet Take 1 tablet (600 mg total) by mouth 2 (two) times daily. 02/11/17  Yes Vassie Lollarlos Madera, MD  LYRICA 50 MG capsule TAKE 1 CAPSULE BY MOUTH 3 TIMES DAILY 02/07/17  Yes Veryl SpeakGregory D Calone, FNP  pantoprazole (PROTONIX) 40 MG tablet Take  1 tablet (40 mg total) by mouth 2 (two) times daily before a meal. Patient taking differently: Take 40 mg by mouth daily.  07/22/16  Yes Jeralyn BennettEzequiel Zamora, MD  potassium chloride SA (K-DUR,KLOR-CON) 20 MEQ tablet Take 1 tablet (20 mEq total) by mouth daily. 01/29/16  Yes Dolores Pattyaniel R Bensimhon, MD  predniSONE (DELTASONE) 20 MG tablet Take 3 tab daily X1 days; then 2 tablet by mouth daily X 2 days; then resume prior tapering instructions and prednisone dose. 02/11/17  Yes Vassie Lollarlos Madera, MD  roflumilast (DALIRESP) 500 MCG TABS tablet Take 500 mcg by mouth daily.   Yes Historical Provider, MD  SPIRIVA RESPIMAT 1.25 MCG/ACT AERS INHALE 2 PUFFS INTO THE LUNGS 2 (TWO) TIMES DAILY. Patient taking differently: Inhale 2 puffs into the lungs daily.  08/16/16  Yes Kalman ShanMurali Ramaswamy, MD  torsemide (DEMADEX) 20 MG tablet Take 1 tablet (20 mg total) by mouth daily. 12/03/16  Yes Kalman ShanMurali Ramaswamy, MD  traMADol (ULTRAM) 50 MG tablet TAKE 1 TABLET BY MOUTH EVERY 6 HOURS AS NEEDED FOR SEVERE PAIN 01/18/17  Yes Veryl SpeakGregory D Calone, FNP  amoxicillin-clavulanate (AUGMENTIN)  875-125 MG tablet Take 1 tablet by mouth 2 (two) times daily. 02/11/17   Vassie Loll, MD  predniSONE (DELTASONE) 5 MG tablet To be taken in 3 days. (Take  20 mg (4 Tab) qam with breakfast 1 week, then take 10 mg (2 tab) qam with breakfast 1 week, then 5 mg daily indefinitely) Patient not taking: Reported on 02/13/2017 02/11/17   Vassie Loll, MD  saccharomyces boulardii (FLORASTOR) 250 MG capsule Take 1 capsule (250 mg total) by mouth 2 (two) times daily. 02/11/17   Vassie Loll, MD    Family History Family History  Problem Relation Age of Onset  . Hypertension Father   . Heart disease Father     CHF; died age 40   . Alcohol abuse Father   . Other Brother     died age 88 y.o overdose    Social History Social History  Substance Use Topics  . Smoking status: Former Smoker    Packs/day: 1.00    Years: 25.00    Types: Cigarettes    Quit date: 02/18/2012    . Smokeless tobacco: Never Used  . Alcohol use No     Comment: previously drinking 1 pint 3-4 days a week for 2002-2013, but in 2014, occasional drinking now      Allergies   Patient has no known allergies.   Review of Systems Review of Systems  All other systems reviewed and are negative.    Physical Exam Updated Vital Signs BP 115/68   Pulse 86   Temp 98.3 F (36.8 C) (Oral)   Resp 18   Ht 5\' 2"  (1.575 m)   Wt 200 lb (90.7 kg)   LMP 02/13/2017   SpO2 95%   BMI 36.58 kg/m   Physical Exam  Constitutional: She is oriented to person, place, and time. She appears well-developed and well-nourished.  HENT:  Head: Normocephalic and atraumatic.  Eyes: Conjunctivae and EOM are normal.  Neck: Normal range of motion.  Cardiovascular: Normal rate and regular rhythm.   Pulmonary/Chest: Effort normal. No stridor. No respiratory distress. She has wheezes.  Abdominal: She exhibits no distension.  Neurological: She is alert and oriented to person, place, and time.  Skin: No rash noted. No erythema.  Nursing note and vitals reviewed.    ED Treatments / Results  Labs (all labs ordered are listed, but only abnormal results are displayed) Labs Reviewed  CBC WITH DIFFERENTIAL/PLATELET - Abnormal; Notable for the following:       Result Value   WBC 15.9 (*)    Neutro Abs 14.1 (*)    All other components within normal limits  BASIC METABOLIC PANEL - Abnormal; Notable for the following:    Chloride 95 (*)    CO2 34 (*)    All other components within normal limits  CULTURE, BLOOD (ROUTINE X 2)  CULTURE, BLOOD (ROUTINE X 2)  FUNGUS CULTURE, BLOOD    EKG  EKG Interpretation None       Radiology No results found.  Procedures Procedures (including critical care time)  Medications Ordered in ED Medications  fluticasone furoate-vilanterol (BREO ELLIPTA) 100-25 MCG/INH 1 puff (1 puff Inhalation Given 02/13/17 1546)  gabapentin (NEURONTIN) capsule 300 mg (300 mg Oral  Given 02/13/17 1546)  pregabalin (LYRICA) capsule 50 mg (not administered)  pantoprazole (PROTONIX) EC tablet 40 mg (not administered)  traMADol (ULTRAM) tablet 50 mg (not administered)  anidulafungin (ERAXIS) 200 mg in sodium chloride 0.9 % 200 mL IVPB (not administered)  anidulafungin (ERAXIS) 100  mg in sodium chloride 0.9 % 100 mL IVPB (not administered)  amoxicillin-clavulanate (AUGMENTIN) 875-125 MG per tablet 1 tablet (1 tablet Oral Given 02/13/17 1546)     Initial Impression / Assessment and Plan / ED Course  I have reviewed the triage vital signs and the nursing notes.  Pertinent labs & imaging results that were available during my care of the patient were reviewed by me and considered in my medical decision making (see chart for details).     Patient had positive blood culture for fungus. Discussed with the hospitalist who is very discussed with infectious disease and recommends capsofungin after Patient Gets Cultures Redrawn. Patient in No Acute Distress at This Time. No Evidence of Sepsis. Plan for Admission to MedSurg with Medicine.  Final Clinical Impressions(s) / ED Diagnoses   Final diagnoses:  Fungemia    New Prescriptions Current Discharge Medication List       Marily Memos, MD 02/13/17 [phone]

## 2017-02-13 NOTE — ED Notes (Signed)
Second set of blood cultures and fungus culture sent to lab.

## 2017-02-13 NOTE — ED Triage Notes (Signed)
Pt was admitted here from 3/7-3/9 with bacterial pneumonia. Pt states she had police come to her door today to tell her that her blood cultures were positive for an infection so that she needed to come back to be treated. Pt is on 3 L oxygen at baseline. Pt is alert and oriented at time of assessment. Pt is not tachycardic nor is she febrile.

## 2017-02-13 NOTE — H&P (Signed)
History and Physical    Mary [name] WUJ:[id number] DOB: 06/16/1976 DOA: 02/13/2017  PCP: Jeanine Luz, FNP   I have briefly reviewed patients previous medical reports in [facility].  Patient coming from: Home  Chief Complaint: 1 of 2 Blood cultures from recent DC positive for yeast.  HPI: Mary [name] is a 41 year old female with PMH of severe COPD (Dr. Marchelle Gearing), oxygen dependent (2.5-3 L/m), steroid dependent (prednisone 5 mg daily), former smoker, Alcohol use, remote PE and completed Coumadin anticoagulation, type II DM, chronic right-sided/diastolic CHF, OSA on nightly BiPAP, heart/lung surgery at birth, multiple hospitalizations (5 in the last 6 months), recent hospitalization 01/31/17-02/04/17 & 02/09/17-02/11/17 when she was treated for acute on chronic hypoxic and hypercapnic respiratory failure secondary to HCAP and COPD exacerbation, improved and was discharged home but was called back to the hospital on 02/13/17 due to one or 2 of 2 blood cultures drawn on 02/09/17 was positive for yeast. Patient states that she has done well since hospital discharge without any new complaints. She was surprised to see the police at her doorstep when they went to relay message to get her back to the hospital. She denies cough, dyspnea, chest pain, fever, chills, dizziness or lightheadedness. Appetite is good.  ED Course: Hemodynamically stable. Not in distress. Lab work showed mild leukocytosis but otherwise unremarkable. Infectious disease was consulted and recommended repeat blood cultures followed by Anadulafungin.  Review of Systems:  All other systems reviewed and apart from HPI, are negative.  Past Medical History:  Diagnosis Date  . Axillary adenopathy    right axillary adenopathy noted on CT chest (03/06/2012)  . Chronic right-sided CHF (congestive heart failure) 03/23/2012   with cor pulmonale. Last RHC 03/2012  . COPD (chronic obstructive pulmonary disease) (HCC)    PFTS  03/08/12: fev1 0.58L/1%, FVC 1.18/33%, Ratop 49 and c/w ssevere obstruction. 21% BD response on FVC, RV 219%, DLCO 11/54%  . DVT of upper extremity (deep vein thrombosis) [facility]) April 2013   right subclavian // Unclear precipitating cause - possibly significant right heart failure, with vascular stasis potentially predisposing to clotting.    . Exertional shortness of breath   . Hepatic cirrhosis (HCC)    Questionable history of - Noted on CT abdomen (03/2012) - thought to be due to vascular congestion from right heart failure +/- patient's history of alcohol abuse  . History of alcoholism (HCC)   . History of chronic bronchitis   . Irregular menses   . Migraine    "~ 1/yr" (05/03/2013)  . Moderate to severe pulmonary hypertension April 2013   Cardiac cath on 03/06/12 - 1. Elevated pulmonary artery pressures, right sided filling pressures.,  2. PA: 64/45 (mean 53)    . On home oxygen therapy    "2-3 L 24/7" (05/03/2013)  . OSA (obstructive sleep apnea)    wears noctural BiPAP (05/03/2013)  . Periodontal disease   . Pneumonia ~ 1985; 01/2011  . Pulmonary embolus [facility]) April 2013   Precipitating cause unclear. Was treated with coumadin from April-June 2013.  . Pulmonary hypertension     Past Surgical History:  Procedure Laterality Date  . APPENDECTOMY  05/03/2013  . CARDIAC CATHETERIZATION  03/2012  . CARDIAC CATHETERIZATION N/A 01/08/2016   Procedure: Right Heart Cath;  Surgeon: Dolores Patty, MD;  Location[facility] INVASIVE CV LAB;  Service: Cardiovascular;  Laterality: N/A;  . CARDIAC SURGERY  03/06/77   "my heart was backwards" (05/03/2013)  . LAPAROSCOPIC APPENDECTOMY N/A 05/03/2013  Procedure: APPENDECTOMY LAPAROSCOPIC;  Surgeon: Cherylynn Ridges, MD;  Location: [facility] OR;  Service: General;  Laterality: N/A;  . LUNG SURGERY  10/14/77  . RIGHT HEART CATHETERIZATION N/A 03/07/2012   Procedure: RIGHT HEART CATH;  Surgeon: Kathleene Hazel, MD;  Location: [facility] CATH LAB;  Service:  Cardiovascular;  Laterality: N/A;    Social History  reports that she quit smoking about 4 years ago. Her smoking use included Cigarettes. She has a 25.00 pack-year smoking history. She has never used smokeless tobacco. She reports that she does not drink alcohol or use drugs.  No Known Allergies  Family History  Problem Relation Age of Onset  . Hypertension Father   . Heart disease Father     CHF; died age 8   . Alcohol abuse Father   . Other Brother     died age 40 y.o overdose     Prior to Admission medications   Medication Sig Start Date End Date Taking? Authorizing Provider  albuterol (PROVENTIL HFA;VENTOLIN HFA) 108 (90 BASE) MCG/ACT inhaler Inhale 2 puffs into the lungs every 6 (six) hours as needed for wheezing or shortness of breath. 09/06/14  Yes Kalman Shan, MD  BREO ELLIPTA 100-25 MCG/INH AEPB INHALE 1 PUFF INTO THE LUNGS DAILY. 06/03/16  Yes Kalman Shan, MD  gabapentin (NEURONTIN) 300 MG capsule TAKE 2 CAPSULES BY MOUTH 3 TIMES DAILY. 12/14/16  Yes Veryl Speak, FNP  glipiZIDE (GLUCOTROL) 5 MG tablet Take 1 tablet (5 mg total) by mouth daily before breakfast. For one week ONLY while on high-dose prednisone 02/04/17  Yes Courage Emokpae, MD  guaiFENesin (MUCINEX) 600 MG 12 hr tablet Take 1 tablet (600 mg total) by mouth 2 (two) times daily. 02/11/17  Yes Vassie Loll, MD  LYRICA 50 MG capsule TAKE 1 CAPSULE BY MOUTH 3 TIMES DAILY 02/07/17  Yes Veryl Speak, FNP  pantoprazole (PROTONIX) 40 MG tablet Take 1 tablet (40 mg total) by mouth 2 (two) times daily before a meal. Patient taking differently: Take 40 mg by mouth daily.  07/22/16  Yes Jeralyn Bennett, MD  potassium chloride SA (K-DUR,KLOR-CON) 20 MEQ tablet Take 1 tablet (20 mEq total) by mouth daily. 01/29/16  Yes Dolores Patty, MD  predniSONE (DELTASONE) 20 MG tablet Take 3 tab daily X1 days; then 2 tablet by mouth daily X 2 days; then resume prior tapering instructions and prednisone dose. 02/11/17  Yes  Vassie Loll, MD  roflumilast (DALIRESP) 500 MCG TABS tablet Take 500 mcg by mouth daily.   Yes Historical Provider, MD  SPIRIVA RESPIMAT 1.25 MCG/ACT AERS INHALE 2 PUFFS INTO THE LUNGS 2 (TWO) TIMES DAILY. Patient taking differently: Inhale 2 puffs into the lungs daily.  08/16/16  Yes Kalman Shan, MD  torsemide (DEMADEX) 20 MG tablet Take 1 tablet (20 mg total) by mouth daily. 12/03/16  Yes Kalman Shan, MD  traMADol (ULTRAM) 50 MG tablet TAKE 1 TABLET BY MOUTH EVERY 6 HOURS AS NEEDED FOR SEVERE PAIN 01/18/17  Yes Veryl Speak, FNP  amoxicillin-clavulanate (AUGMENTIN) 875-125 MG tablet Take 1 tablet by mouth 2 (two) times daily. 02/11/17   Vassie Loll, MD  predniSONE (DELTASONE) 5 MG tablet To be taken in 3 days. (Take  20 mg (4 Tab) qam with breakfast 1 week, then take 10 mg (2 tab) qam with breakfast 1 week, then 5 mg daily indefinitely) Patient not taking: Reported on 02/13/2017 02/11/17   Vassie Loll, MD  saccharomyces boulardii (FLORASTOR) 250 MG capsule Take 1 capsule (250  mg total) by mouth 2 (two) times daily. 02/11/17   Vassie Loll, MD    Physical Exam: Vitals:   02/13/17 1444 02/13/17 1545 02/13/17 1600 02/13/17 1630  BP:  122/74 112/78 115/68  Pulse:  80 77 86  Resp:  18 20 18   Temp:      TempSrc:      SpO2:  93% 96% 95%  Weight: 90.7 kg (200 lb)     Height: 5\' 2"  (1.575 m)         Constitutional: Pleasant young female, moderately built and obese, lying comfortably propped up in the gurney in the ED in no obvious distress. Eyes: PERTLA, lids and conjunctivae normal ENMT: Mucous membranes are moist. Posterior pharynx clear of any exudate or lesions. Normal dentition.  Neck: supple, no masses, no thyromegaly Respiratory: clear to auscultation bilaterally, no wheezing, no crackles. Normal respiratory effort. No accessory muscle use. Left back thoracotomy scar. Cardiovascular: S1 & S2 heard, regular rate and rhythm, no murmurs / rubs / gallops. No extremity edema.  2+ pedal pulses. No carotid bruits.  Abdomen: No distension, no tenderness, no masses palpated. No hepatosplenomegaly. Bowel sounds normal.  Musculoskeletal: no clubbing / cyanosis. No joint deformity upper and lower extremities. Good ROM, no contractures. Normal muscle tone.  Skin: no rashes, lesions, ulcers. No induration Neurologic: CN 2-12 grossly intact. Sensation intact, DTR normal. Strength 5/5 in all 4 limbs.  Psychiatric: Normal judgment and insight. Alert and oriented x 3. Normal mood.     Labs on Admission: I have personally reviewed following labs and imaging studies  CBC:  Recent Labs Lab 02/09/17 0340 02/10/17 0346 02/13/17 1524  WBC 14.0* 12.8* 15.9*  NEUTROABS  --   --  14.1*  HGB 14.6 12.5 13.6  HCT 46.6* 38.7 42.4  MCV 101.3* 99.5 99.5  PLT 169 159 155   Basic Metabolic Panel:  Recent Labs Lab 02/09/17 0340 02/10/17 0346 02/13/17 1524  NA 137 135 137  K 3.4* 4.5 3.8  CL 85* 90* 95*  CO2 42* 37* 34*  GLUCOSE 210* 307* 91  BUN 13 11 12   CREATININE 0.67 0.49 0.51  CALCIUM 9.3 9.5 8.9   Coagulation Profile:  Recent Labs Lab 02/09/17 0340  INR 0.92   Cardiac Enzymes:  Recent Labs Lab 02/09/17 0340  TROPONINI <0.03   CBG:  Recent Labs Lab 02/10/17 1719 02/10/17 2111 02/11/17 0733 02/11/17 1133 02/11/17 1638  GLUCAP 345* 357* 293* 375* 332*      Radiological Exams on Admission: No results found.    Assessment/Plan Principal Problem:   Fungemia Active Problems:   OSA on BiPAP   Esophageal reflux   Morbid obesity (HCC)   Chronic diastolic heart failure (HCC)   Diabetes mellitus type 2 in obese (HCC)   COPD (chronic obstructive pulmonary disease) (HCC)   Chronic respiratory failure with hypoxia and hypercapnia (HCC)     1. Fungemia: One of 2 blood cultures from 02/09/17 shows yeast (preliminary report-final report pending). Patient was advised to return to the hospital for further evaluation and management. Infectious  disease was consulted and recommended repeating blood cultures and initiating Anidulafungin. 2. Chronic hypoxic and hypercapnic respiratory failure: Stable. Recently treated for COPD exacerbation and healthcare associated pneumonia. COPD exacerbation has resolved. Complete course of Augmentin. Recommend repeating chest x-ray in 3-4 weeks to ensure resolution of pneumonia findings. 3. COPD: No clinical bronchospasm. The recent COPD exacerbation has resolved. Prednisone taper back to her maintenance dose of 5 mg daily. Oxygen dependent 2.5-3  L/m. 4. OSA on nightly BiPAP: Continue. 5. Chronic right heart/diastolic CHF: Compensated. 6. Uncontrolled type II DM: A1c on 2/27:6.3. Was poorly controlled during recent admission due to IV steroids. Steroids being tapered. NovoLog SSI.  7. GERD: PPI 8. Morbid obesity/Body mass index is 36.58 kg/m. 9. Tobacco abuse: States that she has not smoked since discharge.   DVT prophylaxis: Lovenox  Code Status: Full  Family Communication: Discussed with spouse at bedside.  Disposition Plan: DC home when medically improved.  Consults called: Infectious disease/Dr. Ninetta LightsHatcher  Admission status: Inpatient, medical bed.    Precision Ambulatory Surgery Center LLCNGALGI,ANAND MD [facility] Pager [phone]  If 7PM-7AM, please contact night-coverage [email or web address] Password [email or web address]  02/13/2017, 5:02 PM

## 2017-02-13 NOTE — ED Notes (Signed)
Hospitalist at bedside 

## 2017-02-13 NOTE — Consult Note (Signed)
[facility]ious Disease  Date of Admission:  02/13/2017  Date of Consult:  02/13/2017  Reason for Consult: FUNGEMIA Referring Physician: Lavonne Chick, Tivis Ringer  Impression/Recommendation Fungemia (1/2 on 3-7) Repeat BCx sent anidulafungin started.  Leukocytosis due to steroids.  She has several risk factors for fungemia (DM, liver disease, steroids, recent anbx). Alternatively, this could be a contaminant or lab error.  We are better to err on the side of caution.   DM2 Watch her FSG in hospital She states she only has DM in the hospital when she is on steroids.  She needs podiatry  ETOH abuse CIWA per priamry team  Smoking cessation Encouraged!  COPD Complete her prev steroid taper and anbx (10 more days)  Thank you so much for this interesting consult,   Bobby Rumpf (pager) [phone] [email or web address]  ED RAYSON is an 41 y.o. female.  HPI: 41 yo F with hx of cirrhosis, ETOH use, pulm HTN, COPD, Dm2, was in hospital on 3-7 to 3-9 with acute on chronic respiratory failure as well as HCAP (preseumed due to 2-26  to 3-2 hospitalization for acute on chronic respiratory failure) . She was d/c on augmentin and prednisone taper.   She is called back today when her BCx from that adm grew yeast.  She is afebrile in ED. Her WBC is 15.9  Past Medical History:  Diagnosis Date  . Axillary adenopathy    right axillary adenopathy noted on CT chest (03/06/2012)  . Chronic right-sided CHF (congestive heart failure) 03/23/2012   with cor pulmonale. Last RHC 03/2012  . COPD (chronic obstructive pulmonary disease) ([place])    PFTS 03/08/12: fev1 0.58L/1%, FVC 1.18/33%, Ratop 49 and c/w ssevere obstruction. 21% BD response on FVC, RV 219%, DLCO 11/54%  . DVT of upper extremity (deep vein thrombosis) [facility]) April 2013   right subclavian // Unclear precipitating cause - possibly significant right heart failure, with vascular stasis potentially  predisposing to clotting.    . Exertional shortness of breath   . Hepatic cirrhosis ([place])    Questionable history of - Noted on CT abdomen (03/2012) - thought to be due to vascular congestion from right heart failure +/- patient's history of alcohol abuse  . History of alcoholism ([place])   . History of chronic bronchitis   . Irregular menses   . Migraine    "~ 1/yr" (05/03/2013)  . Moderate to severe pulmonary hypertension April 2013   Cardiac cath on 03/06/12 - 1. Elevated pulmonary artery pressures, right sided filling pressures.,  2. PA: 64/45 (mean 53)    . On home oxygen therapy    "2-3 L 24/7" (05/03/2013)  . OSA (obstructive sleep apnea)    wears noctural BiPAP (05/03/2013)  . Periodontal disease   . Pneumonia ~ 1985; 01/2011  . Pulmonary embolus [facility]) April 2013   Precipitating cause unclear. Was treated with coumadin from April-June 2013.  . Pulmonary hypertension     Past Surgical History:  Procedure Laterality Date  . APPENDECTOMY  05/03/2013  . CARDIAC CATHETERIZATION  03/2012  . CARDIAC CATHETERIZATION N/A 01/08/2016   Procedure: Right Heart Cath;  Surgeon: Jolaine Artist, MD;  Location: [facility];  Service: Cardiovascular;  Laterality: N/A;  . CARDIAC SURGERY  Jan 09, 1977   "my heart was backwards" (05/03/2013)  . LAPAROSCOPIC APPENDECTOMY N/A 05/03/2013   Procedure: APPENDECTOMY LAPAROSCOPIC;  Surgeon: Gwenyth Ober, MD;  Location: [facility];  Service: General;  Laterality: N/A;  . LUNG SURGERY  Oct 17, 1977  . RIGHT HEART CATHETERIZATION N/A 03/07/2012   Procedure: RIGHT HEART CATH;  Surgeon: Burnell Blanks, MD;  Location: [facility];  Service: Cardiovascular;  Laterality: N/A;     No Known Allergies  Medications:  Scheduled: . amoxicillin-clavulanate  1 tablet Oral Q12H  . [START ON 02/14/2017] anidulafungin  100 mg Intravenous Q24H  . anidulafungin  200 mg Intravenous Once  . enoxaparin (LOVENOX) injection  40 mg Subcutaneous Q24H  . fluticasone  furoate-vilanterol  1 puff Inhalation Daily  . gabapentin  300 mg Oral TID  . guaiFENesin  600 mg Oral BID  . insulin aspart  0-20 Units Subcutaneous TID WC  . insulin aspart  0-5 Units Subcutaneous QHS  . pantoprazole  40 mg Oral BID AC  . potassium chloride SA  20 mEq Oral Daily  . [START ON 02/14/2017] predniSONE  30 mg Oral Q breakfast  . pregabalin  50 mg Oral TID  . roflumilast  500 mcg Oral Daily  . sodium chloride flush  3 mL Intravenous Q12H  . [START ON 02/14/2017] tiotropium  18 mcg Inhalation Daily  . torsemide  20 mg Oral Daily    Abtx:  Anti-infectives    Start     Dose/Rate Route Frequency Ordered Stop   02/14/17 1800  anidulafungin (ERAXIS) 100 mg in sodium chloride 0.9 % 100 mL IVPB     100 mg over 90 Minutes Intravenous Every 24 hours 02/13/17 1601     02/13/17 2200  amoxicillin-clavulanate (AUGMENTIN) 875-125 MG per tablet 1 tablet     1 tablet Oral Every 12 hours 02/13/17 1707     02/13/17 1615  anidulafungin (ERAXIS) 200 mg in sodium chloride 0.9 % 200 mL IVPB     200 mg over 180 Minutes Intravenous  Once 02/13/17 1601     02/13/17 1600  anidulafungin (ERAXIS) 20 mg in sodium chloride 0.9 % 50 mL IVPB  Status:  Discontinued     20 mg over 45 Minutes Intravenous Every 24 hours 02/13/17 1558 02/13/17 1601   02/13/17 1530  amoxicillin-clavulanate (AUGMENTIN) 875-125 MG per tablet 1 tablet     1 tablet Oral  Once 02/13/17 1516 02/13/17 1546      Total days of antibiotics: 0 anidulafungin    2 augmentin          Social History:  reports that she quit smoking about 4 years ago. Her smoking use included Cigarettes. She has a 25.00 pack-year smoking history. She has never used smokeless tobacco. She reports that she does not drink alcohol or use drugs.  Family History  Problem Relation Age of Onset  . Hypertension Father   . Heart disease Father     CHF; died age 30   . Alcohol abuse Father   . Other Brother     died age 22 y.o overdose    General ROS: no  f/c. normal BM, normal urine, normal vision (no ophtho this year. no neuropathy. no cough. no dysphagia.  Please see HPI. 12 point ROS o/w (-)  Blood pressure 115/68, pulse 86, temperature 98.3 F (36.8 C), temperature source Oral, resp. rate 18, height _0  (1.575 m), weight 90.7 kg (200 lb), last menstrual period 02/13/2017, SpO2 95 %. General appearance: alert, cooperative and no distress Eyes: negative findings: conjunctivae and sclerae normal and pupils equal, round, reactive to light and accomodation Throat: lips, mucosa, and tongue normal; teeth and gums normal Neck: no adenopathy and supple, symmetrical, trachea midline Lungs: clear to  auscultation bilaterally Heart: regular rate and rhythm Abdomen: normal findings: bowel sounds normal and soft, non-tender Extremities: edema none.  Skin: thickening of skin on heels. no cordis or wound at site of prev IV.  Neurologic: Sensory: grossly normal on BLE   Results for orders placed or performed during the hospital encounter of 02/13/17 (from the past 48 hour(s))  CBC with Differential     Status: Abnormal   Collection Time: 02/13/17  3:24 PM  Result Value Ref Range   WBC 15.9 (H) 4.0 - 10.5 K/uL   RBC 4.26 3.87 - 5.11 MIL/uL   Hemoglobin 13.6 12.0 - 15.0 g/dL   HCT 42.4 36.0 - 46.0 %   MCV 99.5 78.0 - 100.0 fL   MCH 31.9 26.0 - 34.0 pg   MCHC 32.1 30.0 - 36.0 g/dL   RDW 13.7 11.5 - 15.5 %   Platelets 155 150 - 400 K/uL   Neutrophils Relative % 88 %   Neutro Abs 14.1 (H) 1.7 - 7.7 K/uL   Lymphocytes Relative 7 %   Lymphs Abs 1.2 0.7 - 4.0 K/uL   Monocytes Relative 4 %   Monocytes Absolute 0.6 0.1 - 1.0 K/uL   Eosinophils Relative 1 %   Eosinophils Absolute 0.1 0.0 - 0.7 K/uL   Basophils Relative 0 %   Basophils Absolute 0.0 0.0 - 0.1 K/uL  Basic metabolic panel     Status: Abnormal   Collection Time: 02/13/17  3:24 PM  Result Value Ref Range   Sodium 137 135 - 145 mmol/L   Potassium 3.8 3.5 - 5.1 mmol/L   Chloride 95 (L)  101 - 111 mmol/L   CO2 34 (H) 22 - 32 mmol/L   Glucose, Bld 91 65 - 99 mg/dL   BUN 12 6 - 20 mg/dL   Creatinine, Ser 0.51 0.44 - 1.00 mg/dL   Calcium 8.9 8.9 - 10.3 mg/dL   GFR calc non Af Amer >60 >60 mL/min   GFR calc Af Amer >60 >60 mL/min    Comment: (NOTE) The eGFR has been calculated using the CKD EPI equation. This calculation has not been validated in all clinical situations. eGFR's persistently <60 mL/min signify possible Chronic Kidney Disease.    Anion gap 8 5 - 15      Component Value Date/Time   SDES BLOOD LEFT HAND 02/09/2017 0641   SPECREQUEST BOTTLES DRAWN AEROBIC AND ANAEROBIC 10CC 02/09/2017 0641   CULT  02/09/2017 0641    NO GROWTH 4 DAYS Performed at [facility], [place] [address], [place], [place] [address]    REPTSTATUS PENDING 02/09/2017 [phone]   No results found. Recent Results (from the past 240 hour(s))  Culture, blood (routine x 2)     Status: None (Preliminary result)   Collection Time: 02/09/17  6:30 AM  Result Value Ref Range Status   Specimen Description BLOOD RIGHT ANTECUBITAL  Final   Special Requests BOTTLES DRAWN AEROBIC AND ANAEROBIC 10CC  Final   Culture  Setup Time   Final    AEROBIC BOTTLE ONLY YEAST CRITICAL RESULT CALLED TO, READ BACK BY AND VERIFIED WITH: C MADERA,MD AT [place] 02/13/17 BY L BENFIELD Performed at [facility], [place] [address] [place], [place] [address]    Culture YEAST  Final   Report Status PENDING  Incomplete  Blood Culture ID Panel (Reflexed)     Status: None   Collection Time: 02/09/17  6:30 AM  Result Value Ref Range Status   Enterococcus species  NOT DETECTED NOT DETECTED Final   Listeria monocytogenes NOT DETECTED NOT DETECTED Final   Staphylococcus species NOT DETECTED NOT DETECTED Final   Staphylococcus aureus NOT DETECTED NOT DETECTED Final   Streptococcus species NOT DETECTED NOT DETECTED Final   Streptococcus agalactiae NOT DETECTED NOT DETECTED Final   Streptococcus pneumoniae NOT DETECTED NOT  DETECTED Final   Streptococcus pyogenes NOT DETECTED NOT DETECTED Final   Acinetobacter baumannii NOT DETECTED NOT DETECTED Final   Enterobacteriaceae species NOT DETECTED NOT DETECTED Final   Enterobacter cloacae complex NOT DETECTED NOT DETECTED Final   Escherichia coli NOT DETECTED NOT DETECTED Final   Klebsiella oxytoca NOT DETECTED NOT DETECTED Final   Klebsiella pneumoniae NOT DETECTED NOT DETECTED Final   Proteus species NOT DETECTED NOT DETECTED Final   Serratia marcescens NOT DETECTED NOT DETECTED Final   Haemophilus influenzae NOT DETECTED NOT DETECTED Final   Neisseria meningitidis NOT DETECTED NOT DETECTED Final   Pseudomonas aeruginosa NOT DETECTED NOT DETECTED Final   Candida albicans NOT DETECTED NOT DETECTED Final   Candida glabrata NOT DETECTED NOT DETECTED Final   Candida krusei NOT DETECTED NOT DETECTED Final   Candida parapsilosis NOT DETECTED NOT DETECTED Final   Candida tropicalis NOT DETECTED NOT DETECTED Final    Comment: Performed at [facility], [address], [place], [place] [address]  Culture, blood (routine x 2)     Status: None (Preliminary result)   Collection Time: 02/09/17  6:41 AM  Result Value Ref Range Status   Specimen Description BLOOD LEFT HAND  Final   Special Requests BOTTLES DRAWN AEROBIC AND ANAEROBIC 10CC  Final   Culture   Final    NO GROWTH 4 DAYS Performed at [facility], [place] [address] [place], [place] [address]    Report Status PENDING  Incomplete  MRSA PCR Screening     Status: None   Collection Time: 02/09/17 12:07 PM  Result Value Ref Range Status   MRSA by PCR NEGATIVE NEGATIVE Final    Comment:        The GeneXpert MRSA Assay (FDA approved for NASAL specimens only), is one component of a comprehensive MRSA colonization surveillance program. It is not intended to diagnose MRSA infection nor to guide or monitor treatment for MRSA infections.       02/13/2017, 5:29 PM     LOS: 0 days    Records  and images were personally reviewed where available.

## 2017-02-14 ENCOUNTER — Inpatient Hospital Stay: Payer: 59 | Admitting: Adult Health

## 2017-02-14 LAB — CULTURE, BLOOD (ROUTINE X 2): CULTURE: NO GROWTH

## 2017-02-14 LAB — GLUCOSE, CAPILLARY
GLUCOSE-CAPILLARY: 232 mg/dL — AB (ref 65–99)
GLUCOSE-CAPILLARY: 253 mg/dL — AB (ref 65–99)
Glucose-Capillary: 190 mg/dL — ABNORMAL HIGH (ref 65–99)
Glucose-Capillary: 190 mg/dL — ABNORMAL HIGH (ref 65–99)

## 2017-02-14 MED ORDER — ALBUTEROL SULFATE (2.5 MG/3ML) 0.083% IN NEBU
2.5000 mg | INHALATION_SOLUTION | Freq: Every day | RESPIRATORY_TRACT | Status: DC
  Administered 2017-02-15: 2.5 mg via RESPIRATORY_TRACT
  Filled 2017-02-14: qty 3

## 2017-02-14 MED ORDER — INSULIN GLARGINE 100 UNIT/ML ~~LOC~~ SOLN
10.0000 [IU] | Freq: Every day | SUBCUTANEOUS | Status: DC
  Administered 2017-02-14: 10 [IU] via SUBCUTANEOUS
  Filled 2017-02-14 (×2): qty 0.1

## 2017-02-14 NOTE — Progress Notes (Signed)
Inpatient Diabetes Program Recommendations  AACE/ADA: New Consensus Statement on Inpatient Glycemic Control (2015)  Target Ranges:  Prepandial:   less than 140 mg/dL      Peak postprandial:   less than 180 mg/dL (1-2 hours)      Critically ill patients:  140 - 180 mg/dL   Lab Results  Component Value Date   GLUCAP 253 (H) 02/14/2017   HGBA1C 6.3 (H) 02/01/2017    Review of Glycemic Control  Diabetes history: DM2 Outpatient Diabetes medications: glipizide 5 mg ac breakfast Current orders for Inpatient glycemic control: Novolog 0-20 units tidwc and hs  Need tight control for Fungemia.  Inpatient Diabetes Program Recommendations:    Add Lantus 12 units QHS Add Novolog 4 units tidwc for meal coverage insulin while on steroids.  Continue to follow.  Thank you. Ailene Ardshonda Lequita [facility], RD, LDN, CDE Inpatient Diabetes Coordinator [phone]

## 2017-02-14 NOTE — Progress Notes (Signed)
INFECTIOUS DISEASE PROGRESS NOTE  ID: Mary [name] is a 41 y.o. female with  Principal Problem:   Fungemia Active Problems:   OSA on BiPAP   Esophageal reflux   Morbid obesity (HCC)   Chronic diastolic heart failure (HCC)   Diabetes mellitus type 2 in obese (HCC)   COPD (chronic obstructive pulmonary disease) (HCC)   Chronic respiratory failure with hypoxia and hypercapnia (HCC)  Subjective: Without complaints.  No sob, no cough.   Abtx:  Anti-infectives    Start     Dose/Rate Route Frequency Ordered Stop   02/14/17 1800  anidulafungin (ERAXIS) 100 mg in sodium chloride 0.9 % 100 mL IVPB     100 mg over 90 Minutes Intravenous Every 24 hours 02/13/17 1601     02/13/17 2200  amoxicillin-clavulanate (AUGMENTIN) 875-125 MG per tablet 1 tablet     1 tablet Oral Every 12 hours 02/13/17 1707     02/13/17 1615  anidulafungin (ERAXIS) 200 mg in sodium chloride 0.9 % 200 mL IVPB     200 mg over 180 Minutes Intravenous  Once 02/13/17 1601 02/13/17 2032   02/13/17 1600  anidulafungin (ERAXIS) 20 mg in sodium chloride 0.9 % 50 mL IVPB  Status:  Discontinued     20 mg over 45 Minutes Intravenous Every 24 hours 02/13/17 1558 02/13/17 1601   02/13/17 1530  amoxicillin-clavulanate (AUGMENTIN) 875-125 MG per tablet 1 tablet     1 tablet Oral  Once 02/13/17 1516 02/13/17 1546      Medications:  Scheduled: . [START ON 02/15/2017] albuterol  2.5 mg Nebulization Daily  . amoxicillin-clavulanate  1 tablet Oral Q12H  . anidulafungin  100 mg Intravenous Q24H  . enoxaparin (LOVENOX) injection  40 mg Subcutaneous Q24H  . fluticasone furoate-vilanterol  1 puff Inhalation Daily  . gabapentin  300 mg Oral TID  . guaiFENesin  600 mg Oral BID  . insulin aspart  0-20 Units Subcutaneous TID WC  . insulin aspart  0-5 Units Subcutaneous QHS  . insulin glargine  10 Units Subcutaneous QHS  . pantoprazole  40 mg Oral BID AC  . potassium chloride SA  20 mEq Oral Daily  . predniSONE  30 mg Oral Q  breakfast  . pregabalin  50 mg Oral TID  . roflumilast  500 mcg Oral Daily  . sodium chloride flush  3 mL Intravenous Q12H  . tiotropium  18 mcg Inhalation Daily  . torsemide  20 mg Oral Daily    Objective: Vital signs in last 24 hours: Temp:  [97.4 F (36.3 C)-98.2 F (36.8 C)] 97.4 F (36.3 C) (03/12 1456) Pulse Rate:  [71-85] 71 (03/12 1456) Resp:  [18-20] 18 (03/12 1456) BP: (119-125)/(70-77) 119/70 (03/12 1456) SpO2:  [97 %-99 %] 98 % (03/12 1456)   General appearance: alert, cooperative and no distress Resp: diminished breath sounds bilaterally Cardio: regular rate and rhythm GI: normal findings: bowel sounds normal and soft, non-tender  Lab Results  Recent Labs  02/13/17 1524  WBC 15.9*  HGB 13.6  HCT 42.4  NA 137  K 3.8  CL 95*  CO2 34*  BUN 12  CREATININE 0.51   Liver Panel No results for input(s): PROT, ALBUMIN, AST, ALT, ALKPHOS, BILITOT, BILIDIR, IBILI in the last 72 hours. Sedimentation Rate No results for input(s): ESRSEDRATE in the last 72 hours. C-Reactive Protein No results for input(s): CRP in the last 72 hours.  Microbiology: Recent Results (from the past 240 hour(s))  Culture, blood (routine x  2)     Status: None (Preliminary result)   Collection Time: 02/09/17  6:30 AM  Result Value Ref Range Status   Specimen Description BLOOD RIGHT ANTECUBITAL  Final   Special Requests BOTTLES DRAWN AEROBIC AND ANAEROBIC 10CC  Final   Culture  Setup Time   Final    AEROBIC BOTTLE ONLY YEAST CRITICAL RESULT CALLED TO, READ BACK BY AND VERIFIED WITH: C MADERA,MD AT 0707 02/13/17 BY L BENFIELD Performed at [facility], [address] [place], [place] [address]    Culture YEAST  Final   Report Status PENDING  Incomplete  Blood Culture ID Panel (Reflexed)     Status: None   Collection Time: 02/09/17  6:30 AM  Result Value Ref Range Status   Enterococcus species NOT DETECTED NOT DETECTED Final   Listeria monocytogenes NOT DETECTED NOT DETECTED  Final   Staphylococcus species NOT DETECTED NOT DETECTED Final   Staphylococcus aureus NOT DETECTED NOT DETECTED Final   Streptococcus species NOT DETECTED NOT DETECTED Final   Streptococcus agalactiae NOT DETECTED NOT DETECTED Final   Streptococcus pneumoniae NOT DETECTED NOT DETECTED Final   Streptococcus pyogenes NOT DETECTED NOT DETECTED Final   Acinetobacter baumannii NOT DETECTED NOT DETECTED Final   Enterobacteriaceae species NOT DETECTED NOT DETECTED Final   Enterobacter cloacae complex NOT DETECTED NOT DETECTED Final   Escherichia coli NOT DETECTED NOT DETECTED Final   Klebsiella oxytoca NOT DETECTED NOT DETECTED Final   Klebsiella pneumoniae NOT DETECTED NOT DETECTED Final   Proteus species NOT DETECTED NOT DETECTED Final   Serratia marcescens NOT DETECTED NOT DETECTED Final   Haemophilus influenzae NOT DETECTED NOT DETECTED Final   Neisseria meningitidis NOT DETECTED NOT DETECTED Final   Pseudomonas aeruginosa NOT DETECTED NOT DETECTED Final   Candida albicans NOT DETECTED NOT DETECTED Final   Candida glabrata NOT DETECTED NOT DETECTED Final   Candida krusei NOT DETECTED NOT DETECTED Final   Candida parapsilosis NOT DETECTED NOT DETECTED Final   Candida tropicalis NOT DETECTED NOT DETECTED Final    Comment: Performed at [facility], [address] [place], [place] [address]  Culture, blood (routine x 2)     Status: None   Collection Time: 02/09/17  6:41 AM  Result Value Ref Range Status   Specimen Description BLOOD LEFT HAND  Final   Special Requests BOTTLES DRAWN AEROBIC AND ANAEROBIC 10CC  Final   Culture   Final    NO GROWTH 5 DAYS Performed at [facility], [address] [place], [place] [address]    Report Status 02/14/2017 FINAL  Final  MRSA PCR Screening     Status: None   Collection Time: 02/09/17 12:07 PM  Result Value Ref Range Status   MRSA by PCR NEGATIVE NEGATIVE Final    Comment:        The GeneXpert MRSA Assay (FDA approved for NASAL  specimens only), is one component of a comprehensive MRSA colonization surveillance program. It is not intended to diagnose MRSA infection nor to guide or monitor treatment for MRSA infections.   Blood culture (routine x 2)     Status: None (Preliminary result)   Collection Time: 02/13/17  3:26 PM  Result Value Ref Range Status   Specimen Description BLOOD RIGHT FOREARM  Final   Special Requests BOTTLES DRAWN AEROBIC AND ANAEROBIC 5ML  Final   Culture   Final    NO GROWTH < 24 HOURS Performed at [facility], [address] [place], [place] [address]    Report Status PENDING  Incomplete  Blood culture (routine x 2)     Status: None (Preliminary result)   Collection Time: 02/13/17  4:00 PM  Result Value Ref Range Status   Specimen Description BLOOD LEFT ANTECUBITAL  Final   Special Requests BOTTLES DRAWN AEROBIC AND ANAEROBIC  Final   Culture   Final    NO GROWTH < 24 HOURS Performed at [facility], [address], [place], [place] [address]    Report Status PENDING  Incomplete  Fungus culture, blood     Status: None (Preliminary result)   Collection Time: 02/13/17  4:00 PM  Result Value Ref Range Status   Specimen Description BLOOD LEFT ANTECUBITAL  Final   Special Requests BOTTLES DRAWN AEROBIC ONLY  Final   Culture   Final    NO GROWTH < 24 HOURS Performed at [facility], [address], [place], [place] [address]    Report Status PENDING  Incomplete    Studies/Results: No results found.   Assessment/Plan: Fungemia Day 1 anidulafungin.  Await final of her BCx as well as her repeat BCx (no growth < 24h).  suspect leukocytosis  due to steroids.   DM2 erratic control.  prob due to steroids.   COPD exacerbation Will stop augmentin at day 7  Total days of antibiotics: 1 anidulafungin.     6 routine anbx         Johny Sax Infectious Diseases (pager) [phone] [email or web address] 02/14/2017, 6:40 PM  LOS: 1 day

## 2017-02-14 NOTE — Progress Notes (Signed)
[facility] PROGRESS NOTE  Mary [name] IRJ:[id number] DOB: 08/12/1976 DOA: 02/13/2017 PCP: Jeanine Luz, FNP  Interim summary and HPI 41 year old female with PMH of severe COPD (Dr. Marchelle Gearing), oxygen dependent (2.5-3 L/m), steroid dependent (prednisone 5 mg daily), former smoker, Alcohol use, remote PE and completed Coumadin anticoagulation, type II DM, chronic right-sided/diastolic CHF, OSA on nightly BiPAP, heart/lung surgery at birth, multiple hospitalizations (5 in the last 6 months), recent hospitalization 01/31/17-02/04/17 & 02/09/17-02/11/17 when she was treated for acute on chronic hypoxic and hypercapnic respiratory failure secondary to HCAP and COPD exacerbation, improved and was discharged home but was called back to the hospital on 02/13/17 due to one out of 2 blood cultures drawn on 02/09/17 was positive for yeast.  Assessment/Plan: 1. Fungemia: 1/2 blood cultures from 02/09/17 shows yeast (preliminary report--> final report pending). Patient was advised to return to the hospital for further evaluation and management. Infectious disease was consulted and recommended repeating blood cultures and initiating Anidulafungin while waiting on sensitivity. Patient is hemodynamically stable and w/o signs of any distress. 2. Chronic hypoxic and hypercapnic respiratory failure: Stable. Recently treated for COPD exacerbation and healthcare associated pneumonia. COPD exacerbation has resolved. Complete course of Augmentin. Recommend repeating chest x-ray in 3-4 weeks to ensure resolution of infiltrates. 3. COPD: No clinical bronchospasm. The recent COPD exacerbation has resolved. Prednisone tapering back to her maintenance dose of 5 mg daily. Continue Oxygen supplementation 2.5-3 L/m. 4. OSA: Continue BIPAP QHS. 5. Chronic right heart/diastolic CHF: Compensated. Will follow daily weight and intake/output. 6. Type II DM with hyperglycemia: A1c on 2/27:6.3. patient hyperglycemia associated to use of  steroids. Will use SSI and Lantus while inpatient. Anticipated improved on her CBG's once off steroids. 7. GERD: will continue PPI 8. Morbid obesity/Body mass index is 36.58 kg/m. Low calorie diet discussed with patient. 9. Tobacco abuse: States that she has not smoked since discharge. And declined nicoderm patch.  Code Status: Full Family Communication: ne family at bedside Disposition Plan: remains inpatient received IV antifungal agent until sensitivity is back; will follow ID recommendations. Starting Lantus for better control of her CBG's while inpatient and receiving treatment for fungemia    Consultants:  ID  Procedures:  None    Antibiotics:  Augmentin   Anidulafungin   HPI/Subjective: Afebrile, feeling good and in no distress. Reports breathing at baseline. Patient CBG's elevated.  Objective: Vitals:   02/14/17 0929 02/14/17 1456  BP:  119/70  Pulse: 74 71  Resp: 18 18  Temp:  97.4 F (36.3 C)    Intake/Output Summary (Last 24 hours) at 02/14/17 1718 Last data filed at 02/14/17 1456  Gross per 24 hour  Intake              980 ml  Output                0 ml  Net              980 ml   Filed Weights   02/13/17 1444  Weight: 90.7 kg (200 lb)    Exam:   General:  Afebrile, in no distress; feeling good and w/o acute complaints.  Cardiovascular: S1 and S2, no rubs, no gallops  Respiratory: scattered rhonchi, very little exp wheezing; good air movement; using [place] oxygen (at chronic supplementation rate)  Abdomen: obese, soft, NT, ND, positive BS  Musculoskeletal: no edema, no cyanosis   Data Reviewed: Basic Metabolic Panel:  Recent Labs Lab 02/09/17 0340 02/10/17 0346 02/13/17 1524  NA  137 135 137  K 3.4* 4.5 3.8  CL 85* 90* 95*  CO2 42* 37* 34*  GLUCOSE 210* 307* 91  BUN 13 11 12   CREATININE 0.67 0.49 0.51  CALCIUM 9.3 9.5 8.9   CBC:  Recent Labs Lab 02/09/17 0340 02/10/17 0346 02/13/17 1524  WBC 14.0* 12.8* 15.9*  NEUTROABS  --    --  14.1*  HGB 14.6 12.5 13.6  HCT 46.6* 38.7 42.4  MCV 101.3* 99.5 99.5  PLT 169 159 155   Cardiac Enzymes:  Recent Labs Lab 02/09/17 0340  TROPONINI <0.03   BNP (last 3 results)  Recent Labs  07/18/16 0400 08/25/16 0713 02/09/17 0340  BNP 100.3* 23.4 104.2*    ProBNP (last 3 results)  Recent Labs  04/20/16 1703  PROBNP 30.0    CBG:  Recent Labs Lab 02/13/17 1747 02/13/17 2251 02/14/17 0724 02/14/17 1137 02/14/17 1653  GLUCAP 180* 262* 232* 253* 190*    Recent Results (from the past 240 hour(s))  Culture, blood (routine x 2)     Status: None (Preliminary result)   Collection Time: 02/09/17  6:30 AM  Result Value Ref Range Status   Specimen Description BLOOD RIGHT ANTECUBITAL  Final   Special Requests BOTTLES DRAWN AEROBIC AND ANAEROBIC 10CC  Final   Culture  Setup Time   Final    AEROBIC BOTTLE ONLY YEAST CRITICAL RESULT CALLED TO, READ BACK BY AND VERIFIED WITH: C Viet Kemmerer,MD AT 0707 02/13/17 BY L BENFIELD Performed at [facility], [address] [place], [place] [address]    Culture YEAST  Final   Report Status PENDING  Incomplete  Blood Culture ID Panel (Reflexed)     Status: None   Collection Time: 02/09/17  6:30 AM  Result Value Ref Range Status   Enterococcus species NOT DETECTED NOT DETECTED Final   Listeria monocytogenes NOT DETECTED NOT DETECTED Final   Staphylococcus species NOT DETECTED NOT DETECTED Final   Staphylococcus aureus NOT DETECTED NOT DETECTED Final   Streptococcus species NOT DETECTED NOT DETECTED Final   Streptococcus agalactiae NOT DETECTED NOT DETECTED Final   Streptococcus pneumoniae NOT DETECTED NOT DETECTED Final   Streptococcus pyogenes NOT DETECTED NOT DETECTED Final   Acinetobacter baumannii NOT DETECTED NOT DETECTED Final   Enterobacteriaceae species NOT DETECTED NOT DETECTED Final   Enterobacter cloacae complex NOT DETECTED NOT DETECTED Final   Escherichia coli NOT DETECTED NOT DETECTED Final    Klebsiella oxytoca NOT DETECTED NOT DETECTED Final   Klebsiella pneumoniae NOT DETECTED NOT DETECTED Final   Proteus species NOT DETECTED NOT DETECTED Final   Serratia marcescens NOT DETECTED NOT DETECTED Final   Haemophilus influenzae NOT DETECTED NOT DETECTED Final   Neisseria meningitidis NOT DETECTED NOT DETECTED Final   Pseudomonas aeruginosa NOT DETECTED NOT DETECTED Final   Candida albicans NOT DETECTED NOT DETECTED Final   Candida glabrata NOT DETECTED NOT DETECTED Final   Candida krusei NOT DETECTED NOT DETECTED Final   Candida parapsilosis NOT DETECTED NOT DETECTED Final   Candida tropicalis NOT DETECTED NOT DETECTED Final    Comment: Performed at [facility], [address] [place], [place] [address]  Culture, blood (routine x 2)     Status: None   Collection Time: 02/09/17  6:41 AM  Result Value Ref Range Status   Specimen Description BLOOD LEFT HAND  Final   Special Requests BOTTLES DRAWN AEROBIC AND ANAEROBIC 10CC  Final   Culture   Final    NO GROWTH  5 DAYS Performed at [facility], [address], [place], [place] [address]    Report Status 02/14/2017 FINAL  Final  MRSA PCR Screening     Status: None   Collection Time: 02/09/17 12:07 PM  Result Value Ref Range Status   MRSA by PCR NEGATIVE NEGATIVE Final    Comment:        The GeneXpert MRSA Assay (FDA approved for NASAL specimens only), is one component of a comprehensive MRSA colonization surveillance program. It is not intended to diagnose MRSA infection nor to guide or monitor treatment for MRSA infections.   Blood culture (routine x 2)     Status: None (Preliminary result)   Collection Time: 02/13/17  3:26 PM  Result Value Ref Range Status   Specimen Description BLOOD RIGHT FOREARM  Final   Special Requests BOTTLES DRAWN AEROBIC AND ANAEROBIC  Final   Culture   Final    NO GROWTH < 24 HOURS Performed at [facility], [address] [place], [place] [address]    Report  Status PENDING  Incomplete  Blood culture (routine x 2)     Status: None (Preliminary result)   Collection Time: 02/13/17  4:00 PM  Result Value Ref Range Status   Specimen Description BLOOD LEFT ANTECUBITAL  Final   Special Requests BOTTLES DRAWN AEROBIC AND ANAEROBIC  Final   Culture   Final    NO GROWTH < 24 HOURS Performed at [facility], [address] [place], [place] [address]    Report Status PENDING  Incomplete  Fungus culture, blood     Status: None (Preliminary result)   Collection Time: 02/13/17  4:00 PM  Result Value Ref Range Status   Specimen Description BLOOD LEFT ANTECUBITAL  Final   Special Requests BOTTLES DRAWN AEROBIC ONLY  Final   Culture   Final    NO GROWTH < 24 HOURS Performed at [facility], [address], [place], [place] [address]    Report Status PENDING  Incomplete     Studies: No results found.  Scheduled Meds: . [START ON 02/15/2017] albuterol  2.5 mg Nebulization Daily  . amoxicillin-clavulanate  1 tablet Oral Q12H  . anidulafungin  100 mg Intravenous Q24H  . enoxaparin (LOVENOX) injection  40 mg Subcutaneous Q24H  . fluticasone furoate-vilanterol  1 puff Inhalation Daily  . gabapentin  300 mg Oral TID  . guaiFENesin  600 mg Oral BID  . insulin aspart  0-20 Units Subcutaneous TID WC  . insulin aspart  0-5 Units Subcutaneous QHS  . insulin glargine  10 Units Subcutaneous QHS  . pantoprazole  40 mg Oral BID AC  . potassium chloride SA  20 mEq Oral Daily  . predniSONE  30 mg Oral Q breakfast  . pregabalin  50 mg Oral TID  . roflumilast  500 mcg Oral Daily  . sodium chloride flush  3 mL Intravenous Q12H  . tiotropium  18 mcg Inhalation Daily  . torsemide  20 mg Oral Daily   Continuous Infusions:  Principal Problem:   Fungemia Active Problems:   OSA on BiPAP   Esophageal reflux   Morbid obesity (HCC)   Chronic diastolic heart failure (HCC)   Diabetes mellitus type 2 in obese (HCC)   COPD (chronic obstructive  pulmonary disease) (HCC)   Chronic respiratory failure with hypoxia and hypercapnia (HCC)    Time spent: 25 minutes    Vassie Loll  [facility]  Pager [phone]. If 7PM-7AM, please contact night-coverage at [email or web address], password [facility] 02/14/2017, 5:18 PM  LOS: 1 day

## 2017-02-15 LAB — CULTURE, BLOOD (ROUTINE X 2)

## 2017-02-15 LAB — GLUCOSE, CAPILLARY
GLUCOSE-CAPILLARY: 102 mg/dL — AB (ref 65–99)
GLUCOSE-CAPILLARY: 224 mg/dL — AB (ref 65–99)
Glucose-Capillary: 257 mg/dL — ABNORMAL HIGH (ref 65–99)

## 2017-02-15 MED ORDER — AMOXICILLIN-POT CLAVULANATE 875-125 MG PO TABS: 1.0000 | ORAL_TABLET | Freq: Two times a day (BID) | ORAL | Status: AC

## 2017-02-15 MED ORDER — PREDNISONE 5 MG PO TABS: ORAL_TABLET | ORAL | Status: DC

## 2017-02-15 MED ORDER — FLUCONAZOLE 100 MG PO TABS: 100.0000 mg | ORAL_TABLET | Freq: Every day | ORAL | 0 refills | Status: AC

## 2017-02-15 MED ORDER — PREDNISONE 20 MG PO TABS: 20.0000 mg | ORAL_TABLET | Freq: Every day | ORAL | Status: DC

## 2017-02-15 NOTE — Progress Notes (Signed)
INFECTIOUS DISEASE PROGRESS NOTE  ID: Mary HakimMichelle L [name] is a 41 y.o. female with  Principal Problem:   Fungemia Active Problems:   OSA on BiPAP   Esophageal reflux   Morbid obesity (HCC)   Chronic diastolic heart failure (HCC)   Diabetes mellitus type 2 in obese (HCC)   COPD (chronic obstructive pulmonary disease) (HCC)   Chronic respiratory failure with hypoxia and hypercapnia (HCC)  Subjective: Without complaints  Abtx:  Anti-infectives    Start     Dose/Rate Route Frequency Ordered Stop   02/15/17 0000  amoxicillin-clavulanate (AUGMENTIN) 875-125 MG tablet     1 tablet Oral 2 times daily 02/15/17 1758 02/19/17 2359   02/15/17 0000  fluconazole (DIFLUCAN) 100 MG tablet     100 mg Oral Daily 02/15/17 1758 02/26/17 2359   02/14/17 1800  anidulafungin (ERAXIS) 100 mg in sodium chloride 0.9 % 100 mL IVPB     100 mg over 90 Minutes Intravenous Every 24 hours 02/13/17 1601     02/13/17 2200  amoxicillin-clavulanate (AUGMENTIN) 875-125 MG per tablet 1 tablet     1 tablet Oral Every 12 hours 02/13/17 1707     02/13/17 1615  anidulafungin (ERAXIS) 200 mg in sodium chloride 0.9 % 200 mL IVPB     200 mg over 180 Minutes Intravenous  Once 02/13/17 1601 02/13/17 2032   02/13/17 1600  anidulafungin (ERAXIS) 20 mg in sodium chloride 0.9 % 50 mL IVPB  Status:  Discontinued     20 mg over 45 Minutes Intravenous Every 24 hours 02/13/17 1558 02/13/17 1601   02/13/17 1530  amoxicillin-clavulanate (AUGMENTIN) 875-125 MG per tablet 1 tablet     1 tablet Oral  Once 02/13/17 1516 02/13/17 1546      Medications:  Scheduled: . albuterol  2.5 mg Nebulization Daily  . amoxicillin-clavulanate  1 tablet Oral Q12H  . anidulafungin  100 mg Intravenous Q24H  . enoxaparin (LOVENOX) injection  40 mg Subcutaneous Q24H  . fluticasone furoate-vilanterol  1 puff Inhalation Daily  . gabapentin  300 mg Oral TID  . guaiFENesin  600 mg Oral BID  . insulin aspart  0-20 Units Subcutaneous TID WC  .  insulin aspart  0-5 Units Subcutaneous QHS  . insulin glargine  10 Units Subcutaneous QHS  . pantoprazole  40 mg Oral BID AC  . potassium chloride SA  20 mEq Oral Daily  . [START ON 02/16/2017] predniSONE  20 mg Oral Q breakfast  . pregabalin  50 mg Oral TID  . roflumilast  500 mcg Oral Daily  . sodium chloride flush  3 mL Intravenous Q12H  . tiotropium  18 mcg Inhalation Daily  . torsemide  20 mg Oral Daily    Objective: Vital signs in last 24 hours: Temp:  [97.4 F (36.3 C)-98.4 F (36.9 C)] 98.4 F (36.9 C) (03/13 1400) Pulse Rate:  [63-79] 79 (03/13 1400) Resp:  [18-20] 18 (03/13 1400) BP: (115-134)/(80-89) 134/80 (03/13 1400) SpO2:  [96 %-99 %] 96 % (03/13 1400) Weight:  [94.9 kg (209 lb 3.5 oz)] 94.9 kg (209 lb 3.5 oz) (03/13 0500)   General appearance: alert, cooperative and no distress Resp: clear to auscultation bilaterally Cardio: regular rate and rhythm GI: normal findings: bowel sounds normal and soft, non-tender Extremities: healed scar on back of RLE.   Lab Results  Recent Labs  02/13/17 1524  WBC 15.9*  HGB 13.6  HCT 42.4  NA 137  K 3.8  CL 95*  CO2 34*  BUN 12  CREATININE 0.51   Liver Panel No results for input(s): PROT, ALBUMIN, AST, ALT, ALKPHOS, BILITOT, BILIDIR, IBILI in the last 72 hours. Sedimentation Rate No results for input(s): ESRSEDRATE in the last 72 hours. C-Reactive Protein No results for input(s): CRP in the last 72 hours.  Microbiology: Recent Results (from the past 240 hour(s))  Culture, blood (routine x 2)     Status: Abnormal   Collection Time: 02/09/17  6:30 AM  Result Value Ref Range Status   Specimen Description BLOOD RIGHT ANTECUBITAL  Final   Special Requests BOTTLES DRAWN AEROBIC AND ANAEROBIC 10CC  Final   Culture  Setup Time   Final    AEROBIC BOTTLE ONLY YEAST CRITICAL RESULT CALLED TO, READ BACK BY AND VERIFIED WITH: C MADERA,MD AT 0707 02/13/17 BY L BENFIELD Performed at [facility], [address], [place], [place] [address]    Culture MALASSEZIA PACHYDERMATIS (A)  Final   Report Status 02/15/2017 FINAL  Final  Blood Culture ID Panel (Reflexed)     Status: None   Collection Time: 02/09/17  6:30 AM  Result Value Ref Range Status   Enterococcus species NOT DETECTED NOT DETECTED Final   Listeria monocytogenes NOT DETECTED NOT DETECTED Final   Staphylococcus species NOT DETECTED NOT DETECTED Final   Staphylococcus aureus NOT DETECTED NOT DETECTED Final   Streptococcus species NOT DETECTED NOT DETECTED Final   Streptococcus agalactiae NOT DETECTED NOT DETECTED Final   Streptococcus pneumoniae NOT DETECTED NOT DETECTED Final   Streptococcus pyogenes NOT DETECTED NOT DETECTED Final   Acinetobacter baumannii NOT DETECTED NOT DETECTED Final   Enterobacteriaceae species NOT DETECTED NOT DETECTED Final   Enterobacter cloacae complex NOT DETECTED NOT DETECTED Final   Escherichia coli NOT DETECTED NOT DETECTED Final   Klebsiella oxytoca NOT DETECTED NOT DETECTED Final   Klebsiella pneumoniae NOT DETECTED NOT DETECTED Final   Proteus species NOT DETECTED NOT DETECTED Final   Serratia marcescens NOT DETECTED NOT DETECTED Final   Haemophilus influenzae NOT DETECTED NOT DETECTED Final   Neisseria meningitidis NOT DETECTED NOT DETECTED Final   Pseudomonas aeruginosa NOT DETECTED NOT DETECTED Final   Candida albicans NOT DETECTED NOT DETECTED Final   Candida glabrata NOT DETECTED NOT DETECTED Final   Candida krusei NOT DETECTED NOT DETECTED Final   Candida parapsilosis NOT DETECTED NOT DETECTED Final   Candida tropicalis NOT DETECTED NOT DETECTED Final    Comment: Performed at [facility], [address], [place], [place] [address]  Culture, blood (routine x 2)     Status: None   Collection Time: 02/09/17  6:41 AM  Result Value Ref Range Status   Specimen Description BLOOD LEFT HAND  Final   Special Requests BOTTLES DRAWN AEROBIC AND ANAEROBIC 10CC  Final   Culture   Final    NO GROWTH  5 DAYS Performed at [facility], [address] [place], [place] [address]    Report Status 02/14/2017 FINAL  Final  MRSA PCR Screening     Status: None   Collection Time: 02/09/17 12:07 PM  Result Value Ref Range Status   MRSA by PCR NEGATIVE NEGATIVE Final    Comment:        The GeneXpert MRSA Assay (FDA approved for NASAL specimens only), is one component of a comprehensive MRSA colonization surveillance program. It is not intended to diagnose MRSA infection nor to guide or monitor treatment for MRSA infections.   Blood culture (routine x 2)  Status: None (Preliminary result)   Collection Time: 02/13/17  3:26 PM  Result Value Ref Range Status   Specimen Description BLOOD RIGHT FOREARM  Final   Special Requests BOTTLES DRAWN AEROBIC AND ANAEROBIC  Final   Culture   Final    NO GROWTH 2 DAYS Performed at [facility], [address], [place], [place] [address]    Report Status PENDING  Incomplete  Blood culture (routine x 2)     Status: None (Preliminary result)   Collection Time: 02/13/17  4:00 PM  Result Value Ref Range Status   Specimen Description BLOOD LEFT ANTECUBITAL  Final   Special Requests BOTTLES DRAWN AEROBIC AND ANAEROBIC  Final   Culture   Final    NO GROWTH 2 DAYS Performed at [facility], [address], [place], [place] [address]    Report Status PENDING  Incomplete  Fungus culture, blood     Status: None (Preliminary result)   Collection Time: 02/13/17  4:00 PM  Result Value Ref Range Status   Specimen Description BLOOD LEFT ANTECUBITAL  Final   Special Requests BOTTLES DRAWN AEROBIC ONLY  Final   Culture   Final    NO GROWTH 2 DAYS Performed at [facility], [address], [place], [place] [address]    Report Status PENDING  Incomplete    Studies/Results: No results found.   Assessment/Plan: Fungemia (1/2 on 3-7)- MALASSEZIA PACHYDERMATIS Repeat BCx sent anidulafungin started.  Leukocytosis  likely due to steroids.  She has several risk factors for fungemia (DM, liver disease, steroids, recent anbx).  Alternatively, this could be a contaminant or lab error. Repeat BCx are negative She has dog, cat, chickens at home. She is not clear if she got scratch wound her RLE from her dog or from being "flogged" by a rooster  Would favor sending her home with diflucan to complete 2 weeks of therapy.   DM2 Glc eratic.  Discussed with Dr Gwenlyn Perking.  Available as needed.   Total days of antibiotics: 2         Johny Sax Infectious Diseases (pager) [phone] [email or web address] 02/15/2017, 5:59 PM  LOS: 2 days

## 2017-02-15 NOTE — Discharge Summary (Signed)
Physician Discharge Summary  Mary [name] ZOX:[id number] DOB: [birth year] DOA: 02/13/2017  PCP: Jeanine Luz, FNP  Admit date: 02/13/2017 Discharge date: 02/15/2017  Time spent: 35 minutes  Recommendations for Outpatient Follow-up:  1. Repeat BMET to follow electrolytes and renal function 2. Please repeat CBC to follow resolution of WBC's, mainly associated with use of steroids 3. -repeat CXR in 3-4 weeks to assure resolution of infiltrates, as previously recommended.   Discharge Diagnoses:  Principal Problem:   Fungemia Active Problems:   OSA on BiPAP   Esophageal reflux   Morbid obesity (HCC)   Chronic diastolic heart failure (HCC)   Diabetes mellitus type 2 in obese (HCC)   COPD (chronic obstructive pulmonary disease) (HCC)   Chronic respiratory failure with hypoxia and hypercapnia (HCC)   Discharge Condition: stable and improved. Discharge home with instructions to follow up with PCP and pulmonary service as previously instructed.  Diet recommendation: low sodium and modified carbohydrates.  Filed Weights   02/13/17 1444 02/15/17 0500  Weight: 90.7 kg (200 lb) 94.9 kg (209 lb 3.5 oz)    History of present illness:  41 year old female with PMH of severe COPD (Dr. Marchelle Gearing), oxygen dependent (2.5-3 L/m), steroid dependent (prednisone 5 mg daily), former smoker, Alcohol use, remote PE and completed Coumadin anticoagulation, type II DM, chronic right-sided/diastolic CHF, OSA on nightly BiPAP, heart/lung surgery atbirth, multiple hospitalizations (5 in the last 6 months), recent hospitalization 01/31/17-02/04/17&02/09/17-02/11/17 when she was treated for acute on chronic hypoxic and hypercapnic respiratory failure secondary to  [facility] COPD exacerbation, improved and was discharged home but was called back to the hospital on 02/13/17 due to one out of 2 blood cultures drawn on 02/09/17 was positive for yeast.  Hospital Course:  1. Fungemia (due to Malassezia Pachydermatis : 1/2  blood cultures from 02/09/17. Patient was advised to return to the hospital for further evaluation and management. Infectious disease was consulted and recommended repeating blood cultures and initiating Anidulafungin while waiting on sensitivity. Patient received treatment as discussed and once sensitivity and identification acquired, discharge home on diflucan 100mg  daily for 11 days. Patient hemodynamically stable and w/o signs of any distress. 2. Chronic hypoxic and hypercapnic respiratory failure: Stable. Recently treated for COPD exacerbation and healthcare associated pneumonia. COPD exacerbation has resolved. Complete course of Augmentin (four more days left at discharge). Recommend repeating chest x-ray in 3-4 weeks to ensure resolution of infiltrates. 3. COPD:No clinical bronchospasm. The recent COPD exacerbation has resolved. Prednisone in process of tapering back down to her maintenance dose of 5 mg daily. Continue Oxygen supplementation 2.5-3 L/m. 4. OSA: Continue BIPAP QHS. 5. Chronic right heart/diastolic CHF: Compensated. Advise to check daily weight, to continue demadex and to follow low sodium diet. 6. Type II DM with hyperglycemia:A1c on 2/27: 6.3. patient hyperglycemia associated to use of steroids. Will resume use of glipizide and encourage to follow low carb diet. Anticipated improved on her CBG's once off higher dosages of steroids. 7. GERD: will continue PPI 8. Morbid obesity/Body mass index is 36.58 kg/m. Low calorie diet discussed with patient. 9. Tobacco abuse:States that she has not smoked since discharge. And declined nicoderm patch.  Procedures: None   Consultations:  ID  Discharge Exam: Vitals:   02/15/17 0551 02/15/17 1400  BP: 128/84 134/80  Pulse: 70 79  Resp:  18  Temp: 97.4 F (36.3 C) 98.4 F (36.9 C)    General:  Afebrile, in no distress; feeling good and w/o acute complaints.  Cardiovascular: S1 and S2, no rubs, no  gallops  Respiratory:  scattered rhonchi, very little end exp wheezing; good air movement; using Clint oxygen (at chronic supplementation rate)  Abdomen: obese, soft, NT, ND, positive BS  Musculoskeletal: no edema, no cyanosis   Discharge Instructions   Discharge Instructions    Discharge instructions    Complete by:  As directed    Take medications as prescribed Follow low sodium diet and modified carbohydrates Keep yourself well hydrated Follow up with PCP and pulmonary service as previously instructed     Current Discharge Medication List    START taking these medications   Details  fluconazole (DIFLUCAN) 100 MG tablet Take 1 tablet (100 mg total) by mouth daily. Qty: 11 tablet, Refills: 0      CONTINUE these medications which have CHANGED   Details  amoxicillin-clavulanate (AUGMENTIN) 875-125 MG tablet Take 1 tablet by mouth 2 (two) times daily.    predniSONE (DELTASONE) 5 MG tablet Take  20 mg (4 Tab) qam with breakfast 2 days, then take 10 mg (2 tab) qam with breakfast 3 days; then resume 5 mg daily indefinitely)      CONTINUE these medications which have NOT CHANGED   Details  albuterol (PROVENTIL HFA;VENTOLIN HFA) 108 (90 BASE) MCG/ACT inhaler Inhale 2 puffs into the lungs every 6 (six) hours as needed for wheezing or shortness of breath. Qty: 8.5 g, Refills: 1    BREO ELLIPTA 100-25 MCG/INH AEPB INHALE 1 PUFF INTO THE LUNGS DAILY. Qty: 60 each, Refills: 3    gabapentin (NEURONTIN) 300 MG capsule TAKE 2 CAPSULES BY MOUTH 3 TIMES DAILY. Qty: 180 capsule, Refills: 2   Associated Diagnoses: Diabetic polyneuropathy associated with type 2 diabetes mellitus (HCC)    glipiZIDE (GLUCOTROL) 5 MG tablet Take 1 tablet (5 mg total) by mouth daily before breakfast. For one week ONLY while on high-dose prednisone Qty: 7 tablet, Refills: 0    guaiFENesin (MUCINEX) 600 MG 12 hr tablet Take 1 tablet (600 mg total) by mouth 2 (two) times daily. Qty: 40 tablet, Refills: 0    LYRICA 50 MG capsule TAKE  1 CAPSULE BY MOUTH 3 TIMES DAILY Qty: 90 capsule, Refills: 1    pantoprazole (PROTONIX) 40 MG tablet Take 1 tablet (40 mg total) by mouth 2 (two) times daily before a meal. Qty: 60 tablet, Refills: 0    potassium chloride SA (K-DUR,KLOR-CON) 20 MEQ tablet Take 1 tablet (20 mEq total) by mouth daily. Qty: 30 tablet, Refills: 6    roflumilast (DALIRESP) 500 MCG TABS tablet Take 500 mcg by mouth daily.    SPIRIVA RESPIMAT 1.25 MCG/ACT AERS INHALE 2 PUFFS INTO THE LUNGS 2 (TWO) TIMES DAILY. Qty: 1 Inhaler, Refills: 3    torsemide (DEMADEX) 20 MG tablet Take 1 tablet (20 mg total) by mouth daily. Qty: 90 tablet, Refills: 0    traMADol (ULTRAM) 50 MG tablet TAKE 1 TABLET BY MOUTH EVERY 6 HOURS AS NEEDED FOR SEVERE PAIN Qty: 120 tablet, Refills: 0   Associated Diagnoses: Diabetic polyneuropathy associated with type 2 diabetes mellitus (HCC)    saccharomyces boulardii (FLORASTOR) 250 MG capsule Take 1 capsule (250 mg total) by mouth 2 (two) times daily. Qty: 30 capsule, Refills: 0       No Known Allergies   The results of significant diagnostics from this hospitalization (including imaging, microbiology, ancillary and laboratory) are listed below for reference.    Significant Diagnostic Studies: Ct Chest High Resolution  Result Date: 02/02/2017 CLINICAL DATA:  COPD, oxygen dependency, recent prednisone therapy, 7  shortness of breath. EXAM: CT CHEST WITHOUT CONTRAST TECHNIQUE: Multidetector CT imaging of the chest was performed following the standard protocol without intravenous contrast. High resolution imaging of the lungs, as well as inspiratory and expiratory imaging, was performed. COMPARISON:  CT chest exams dating back 12/27/2013. FINDINGS: Cardiovascular: Pulmonary arteries and heart are enlarged. No pericardial effusion. Mediastinum/Nodes: No pathologically enlarged mediastinal or axillary lymph nodes. Hilar regions are difficult to evaluate without IV contrast but appear grossly  unremarkable. Esophagus is grossly unremarkable. Lungs/Pleura: Mosaic pulmonary parenchymal attenuation with scattered pulmonary parenchymal scarring and mild architectural distortion. No subpleural reticulation, traction bronchiectasis/bronchiolectasis, ground-glass or honeycombing. Focal area of ground-glass in the posterior segment right upper lobe measures 2.1 cm, stable from 01/13/2015 but minimally enlarged from 12/27/2013, at which time it measured approximately 1.7 cm. No solid components. No pleural fluid. Airway is unremarkable. Upper Abdomen: Liver appears enlarged enlarged and diffusely decreased in attenuation but is incompletely imaged. Visualized portions of the gallbladder, adrenal glands, right kidney unremarkable. Punctate stone left kidney. Visualized portions of the spleen, pancreas, stomach and bowel are grossly unremarkable. No upper abdominal adenopathy. Musculoskeletal: No worrisome lytic or sclerotic lesions. IMPRESSION: 1. Scattered pulmonary parenchymal scarring with mosaic pulmonary parenchymal attenuation and mild architectural distortion, suggestive of obliterative bronchiolitis. 2. No evidence of fibrotic interstitial lung disease. 3. Enlarged pulmonary arteries, indicative of pulmonary arterial hypertension. 4. Right upper lobe ground-glass nodule, stable from 01/18/2015 but minimally slightly enlarged from 12/27/2013. Additional follow-up could be obtained in 2 years to ensure continued stability. This recommendation follows the consensus statement: Guidelines for Management of Small Pulmonary Nodules Detected on CT Images: From the Fleischner Society 2017; Radiology 2017; 284:228-243. 5. Fatty enlarged liver. 6. Left renal stone. Electronically Signed   By: Leanna Battles M.D.   On: 02/02/2017 08:21   Dg Chest Port 1 View  Result Date: 02/09/2017 CLINICAL DATA:  Sudden onset dyspnea. EXAM: PORTABLE CHEST 1 VIEW COMPARISON:  02/01/2017, 01/31/2017. FINDINGS: Patchy airspace  opacity in the left lung base could represent pneumonia. Right lung is clear. No large effusions. Pulmonary vasculature is normal. IMPRESSION: Patchy airspace opacity in the left base. Cannot exclude pneumonia. Followup PA and lateral chest X-ray is recommended in 3-4 weeks following trial of antibiotic therapy to ensure resolution and exclude underlying malignancy. Electronically Signed   By: Ellery Plunk M.D.   On: 02/09/2017 04:30   Dg Chest Port 1 View  Result Date: 01/31/2017 CLINICAL DATA:  Shortness of breath. EXAM: PORTABLE CHEST 1 VIEW COMPARISON:  01/11/2017.  08/25/2016.  07/18/2016.  11/13/2015. FINDINGS: Mediastinum and hilar structures are stable . Stable cardiomegaly. Mild basilar interstitial prominence. Small right pleural effusion. Left costophrenic angle not imaged. Mild CHF cannot be excluded. Left base pleural-parenchymal thickening again noted consistent scarring. IMPRESSION: 1. Cardiomegaly with mild basilar interstitial prominence of small right pleural effusion. Mild CHF cannot be excluded. 2. Left base pleuroparenchymal thickening again noted consistent with scarring. Electronically Signed   By: Maisie Fus  Register   On: 01/31/2017 17:31   Dg Swallowing Func-speech Pathology  Result Date: 02/02/2017 Objective Swallowing Evaluation: Type of Study: MBS-Modified Barium Swallow Study Patient Details Name: ALIZEY NOREN MRN: [id number] Date of Birth: September 03, 1976 Today's Date: 02/02/2017 Time: SLP Start Time (ACUTE ONLY): 1135-SLP Stop Time (ACUTE ONLY): 1155 SLP Time Calculation (min) (ACUTE ONLY): 20 min Past Medical History: Past Medical History: Diagnosis Date . Axillary adenopathy   right axillary adenopathy noted on CT chest (03/06/2012) . Chronic right-sided CHF (congestive heart failure) 03/23/2012  with  cor pulmonale. Last RHC 03/2012 . COPD (chronic obstructive pulmonary disease) (HCC)   PFTS 03/08/12: fev1 0.58L/1%, FVC 1.18/33%, Ratop 49 and c/w ssevere obstruction. 21% BD  response on FVC, RV 219%, DLCO 11/54% . DVT of upper extremity (deep vein thrombosis) [facility]) April 2013  right subclavian // Unclear precipitating cause - possibly significant right heart failure, with vascular stasis potentially predisposing to clotting.   . Exertional shortness of breath  . Hepatic cirrhosis (HCC)   Questionable history of - Noted on CT abdomen (03/2012) - thought to be due to vascular congestion from right heart failure +/- patient's history of alcohol abuse . History of alcoholism (HCC)  . History of chronic bronchitis  . Irregular menses  . Migraine   "~ 1/yr" (05/03/2013) . Moderate to severe pulmonary hypertension April 2013  Cardiac cath on 03/06/12 - 1. Elevated pulmonary artery pressures, right sided filling pressures.,  2. PA: 64/45 (mean 53)   . On home oxygen therapy   "2-3 L 24/7" (05/03/2013) . OSA (obstructive sleep apnea)   wears noctural BiPAP (05/03/2013) . Periodontal disease  . Pneumonia ~ 1985; 01/2011 . Pulmonary embolus [facility]) April 2013  Precipitating cause unclear. Was treated with coumadin from April-June 2013. Past Surgical History: Past Surgical History: Procedure Laterality Date . APPENDECTOMY  05/03/2013 . CARDIAC CATHETERIZATION  03/2012 . CARDIAC CATHETERIZATION N/A 01/08/2016  Procedure: Right Heart Cath;  Surgeon: Dolores Patty, MD;  Location: [facility] INVASIVE CV LAB;  Service: Cardiovascular;  Laterality: N/A; . CARDIAC SURGERY  05/03/1977  "my heart was backwards" (05/03/2013) . LAPAROSCOPIC APPENDECTOMY N/A 05/03/2013  Procedure: APPENDECTOMY LAPAROSCOPIC;  Surgeon: Cherylynn Ridges, MD;  Location: [facility] OR;  Service: General;  Laterality: N/A; . LUNG SURGERY  1976-12-26 . RIGHT HEART CATHETERIZATION N/A 03/07/2012  Procedure: RIGHT HEART CATH;  Surgeon: Kathleene Hazel, MD;  Location: [facility] CATH LAB;  Service: Cardiovascular;  Laterality: N/A; HPI: 41 year old female admitted 01/31/17 with acute on chronic respiratory failure with hypoxia. PMH significant for COPD, ETOH abuse,  DVT, PE, HTN, chronic bronchitis, PNA, OSA, migraines, CHF, SOB.  Subjective: Pt seen in radiology for MBS to objectively assess swallow function and safety Assessment / Plan / Recommendation CHL IP CLINICAL IMPRESSIONS 02/02/2017 Clinical Impression Pt presents with normal oral, mild pharyngeal dysphagia, as well as some degree of esophageal dysmotility. Pharyngeal swallow is characterized by delayed swallow reflex on puree and solid consistencies. No penetration, aspiration, post-swallow residue, or backflow to the pharynx was noted on any consistency. The barium tablet was given with a sip of water, and while it cleared the oropharynx without difficulty or delay, esophageal sweep revealed it paused in the mid-thoracic region. Several minutes and multiple boluses of liquid were given to assist in clearing the pill. Recommend continuing with regular diet and thin liquids, following precautions for management of esophageal dysmotility (which were provided and reviewed with pt). Also recommend taking meds one at a time, and following large pills with additional boluses of water and/or allowing extra time between pills.  SLP Visit Diagnosis Dysphagia, pharyngoesophageal phase (R13.14) Impact on safety and function Mild aspiration risk   CHL IP TREATMENT RECOMMENDATION 02/02/2017 Treatment Recommendations No treatment recommended at this time. Education completed in radiology immediately following this study.   Prognosis 02/02/2017 Prognosis for Safe Diet Advancement Good CHL IP DIET RECOMMENDATION 02/02/2017 SLP Diet Recommendations Regular solids;Thin liquid Liquid Administration via Cup;Straw Medication Administration Whole meds with puree one at a time, following large pills with several boluses of water. Compensations  Minimize environmental distractions;Slow rate;Small sips/bites;Follow solids with liquid Postural Changes Seated upright at 90 degrees; Remain semi-upright after after feeds/meals    CHL IP OTHER  RECOMMENDATIONS 02/02/2017 Oral Care Recommendations Oral care BID    CHL IP ORAL PHASE 02/02/2017 Oral Phase WFL  CHL IP PHARYNGEAL PHASE 02/02/2017 Pharyngeal Phase Impaired Pharyngeal- Thin Cup WFL Pharyngeal- Thin Straw WFL Pharyngeal- Puree Delayed swallow initiation-vallecula Pharyngeal- Regular Delayed swallow initiation-vallecula Pharyngeal- Pill WFL  CHL IP CERVICAL ESOPHAGEAL PHASE 02/02/2017 Cervical Esophageal Phase Impaired Pill Barium tablet cleared the oropharynx without delay or difficulty. Upon esophageal sweep, pill was noted to be stopped at the mid-thoracic region. Several minutes and multiple boluses of thin liquid were required to clear barium tablet. Celia B. St[facility] Joseph[facility]che, [facility] N BurdickMSP, CCC-SLP [phone] Leigh AuroraBueche, Celia Brown 02/02/2017, 12:52 PM               Microbiology: Recent Results (from the past 240 hour(s))  Culture, blood (routine x 2)     Status: Abnormal   Collection Time: 02/09/17  6:30 AM  Result Value Ref Range Status   Specimen Description BLOOD RIGHT ANTECUBITAL  Final   Special Requests BOTTLES DRAWN AEROBIC AND ANAEROBIC 10CC  Final   Culture  Setup Time   Final    AEROBIC BOTTLE ONLY YEAST CRITICAL RESULT CALLED TO, READ BACK BY AND VERIFIED WITH: C Tuwanna Krausz,MD AT 0707 02/13/17 BY L BENFIELD Performed at [facility], [address] [place], [place] [address]    Culture MALASSEZIA PACHYDERMATIS (A)  Final   Report Status 02/15/2017 FINAL  Final  Blood Culture ID Panel (Reflexed)     Status: None   Collection Time: 02/09/17  6:30 AM  Result Value Ref Range Status   Enterococcus species NOT DETECTED NOT DETECTED Final   Listeria monocytogenes NOT DETECTED NOT DETECTED Final   Staphylococcus species NOT DETECTED NOT DETECTED Final   Staphylococcus aureus NOT DETECTED NOT DETECTED Final   Streptococcus species NOT DETECTED NOT DETECTED Final   Streptococcus agalactiae NOT DETECTED NOT DETECTED Final   Streptococcus pneumoniae NOT DETECTED NOT DETECTED Final    Streptococcus pyogenes NOT DETECTED NOT DETECTED Final   Acinetobacter baumannii NOT DETECTED NOT DETECTED Final   Enterobacteriaceae species NOT DETECTED NOT DETECTED Final   Enterobacter cloacae complex NOT DETECTED NOT DETECTED Final   Escherichia coli NOT DETECTED NOT DETECTED Final   Klebsiella oxytoca NOT DETECTED NOT DETECTED Final   Klebsiella pneumoniae NOT DETECTED NOT DETECTED Final   Proteus species NOT DETECTED NOT DETECTED Final   Serratia marcescens NOT DETECTED NOT DETECTED Final   Haemophilus influenzae NOT DETECTED NOT DETECTED Final   Neisseria meningitidis NOT DETECTED NOT DETECTED Final   Pseudomonas aeruginosa NOT DETECTED NOT DETECTED Final   Candida albicans NOT DETECTED NOT DETECTED Final   Candida glabrata NOT DETECTED NOT DETECTED Final   Candida krusei NOT DETECTED NOT DETECTED Final   Candida parapsilosis NOT DETECTED NOT DETECTED Final   Candida tropicalis NOT DETECTED NOT DETECTED Final    Comment: Performed at [facility], [address] [place], [place] [address]  Culture, blood (routine x 2)     Status: None   Collection Time: 02/09/17  6:41 AM  Result Value Ref Range Status   Specimen Description BLOOD LEFT HAND  Final   Special Requests BOTTLES DRAWN AEROBIC AND ANAEROBIC 10CC  Final   Culture   Final    NO GROWTH 5 DAYS Performed at [facility], [address]  [place], [place] [address]    Report Status 02/14/2017 FINAL  Final  MRSA PCR Screening     Status: None   Collection Time: 02/09/17 12:07 PM  Result Value Ref Range Status   MRSA by PCR NEGATIVE NEGATIVE Final    Comment:        The GeneXpert MRSA Assay (FDA approved for NASAL specimens only), is one component of a comprehensive MRSA colonization surveillance program. It is not intended to diagnose MRSA infection nor to guide or monitor treatment for MRSA infections.   Blood culture (routine x 2)     Status: None (Preliminary result)   Collection Time: 02/13/17   3:26 PM  Result Value Ref Range Status   Specimen Description BLOOD RIGHT FOREARM  Final   Special Requests BOTTLES DRAWN AEROBIC AND ANAEROBIC  Final   Culture   Final    NO GROWTH 2 DAYS Performed at [facility], [address], [place], [place] [address]    Report Status PENDING  Incomplete  Blood culture (routine x 2)     Status: None (Preliminary result)   Collection Time: 02/13/17  4:00 PM  Result Value Ref Range Status   Specimen Description BLOOD LEFT ANTECUBITAL  Final   Special Requests BOTTLES DRAWN AEROBIC AND ANAEROBIC  Final   Culture   Final    NO GROWTH 2 DAYS Performed at [facility], [address] [place], [place] [address]    Report Status PENDING  Incomplete  Fungus culture, blood     Status: None (Preliminary result)   Collection Time: 02/13/17  4:00 PM  Result Value Ref Range Status   Specimen Description BLOOD LEFT ANTECUBITAL  Final   Special Requests BOTTLES DRAWN AEROBIC ONLY  Final   Culture   Final    NO GROWTH 2 DAYS Performed at [facility], [address], [place], [place] [address]    Report Status PENDING  Incomplete     Labs: Basic Metabolic Panel:  Recent Labs Lab 02/09/17 0340 02/10/17 0346 02/13/17 1524  NA 137 135 137  K 3.4* 4.5 3.8  CL 85* 90* 95*  CO2 42* 37* 34*  GLUCOSE 210* 307* 91  BUN 13 11 12   CREATININE 0.67 0.49 0.51  CALCIUM 9.3 9.5 8.9   CBC:  Recent Labs Lab 02/09/17 0340 02/10/17 0346 02/13/17 1524  WBC 14.0* 12.8* 15.9*  NEUTROABS  --   --  14.1*  HGB 14.6 12.5 13.6  HCT 46.6* 38.7 42.4  MCV 101.3* 99.5 99.5  PLT 169 159 155   Cardiac Enzymes:  Recent Labs Lab 02/09/17 0340  TROPONINI <0.03   BNP: BNP (last 3 results)  Recent Labs  07/18/16 0400 08/25/16 0713 02/09/17 0340  BNP 100.3* 23.4 104.2*    ProBNP (last 3 results)  Recent Labs  04/20/16 1703  PROBNP 30.0    CBG:  Recent Labs Lab 02/14/17 1653 02/14/17 2133 02/15/17 0739  02/15/17 1158 02/15/17 1654  GLUCAP 190* 190* 257* 102* 224*    Signed:  Vassie Loll MD.  [facility] 02/15/2017, 5:59 PM

## 2017-02-18 ENCOUNTER — Telehealth: Payer: Self-pay | Admitting: Internal Medicine

## 2017-02-18 LAB — CULTURE, BLOOD (ROUTINE X 2)
CULTURE: NO GROWTH
Culture: NO GROWTH

## 2017-02-18 NOTE — Telephone Encounter (Signed)
A prior authorization was received for Daliresp from [employer] via covermymeds. After reviewing the chart an active prescription could not be located. Covermymeds was contacted who stated we received the PA as it automatically generated being the approval from previous year is over and MR sent in last years rx. After reviewing MR notes, he recommended patient remain on medication and PA was initiated. The information was sent to plan and is currently awaiting a decision. Will follow up at later date.   CCM key: [id number]

## 2017-02-19 LAB — FUNGUS CULTURE, BLOOD: CULTURE: NO GROWTH

## 2017-02-21 NOTE — Telephone Encounter (Signed)
Received fax from [employer] that pt's Mary LlamasDaliresp has been approved until 3.16.2019. Pt aware. Nothing further needed at this time.

## 2017-03-09 DIAGNOSIS — J449 Chronic obstructive pulmonary disease, unspecified: Secondary | ICD-10-CM | POA: Diagnosis not present

## 2017-03-09 DIAGNOSIS — I270 Primary pulmonary hypertension: Secondary | ICD-10-CM | POA: Diagnosis not present

## 2017-03-09 DIAGNOSIS — G4733 Obstructive sleep apnea (adult) (pediatric): Secondary | ICD-10-CM | POA: Diagnosis not present

## 2017-03-11 DIAGNOSIS — I270 Primary pulmonary hypertension: Secondary | ICD-10-CM | POA: Diagnosis not present

## 2017-03-11 DIAGNOSIS — G4733 Obstructive sleep apnea (adult) (pediatric): Secondary | ICD-10-CM | POA: Diagnosis not present

## 2017-03-11 DIAGNOSIS — J449 Chronic obstructive pulmonary disease, unspecified: Secondary | ICD-10-CM | POA: Diagnosis not present

## 2017-03-14 ENCOUNTER — Ambulatory Visit (INDEPENDENT_AMBULATORY_CARE_PROVIDER_SITE_OTHER): Payer: 59 | Admitting: Adult Health

## 2017-03-14 ENCOUNTER — Ambulatory Visit (INDEPENDENT_AMBULATORY_CARE_PROVIDER_SITE_OTHER)
Admission: RE | Admit: 2017-03-14 | Discharge: 2017-03-14 | Disposition: A | Discharge status: Home / Self Care | Payer: 59 | Source: Ambulatory Visit | Attending: Adult Health | Admitting: Adult Health

## 2017-03-14 ENCOUNTER — Encounter: Payer: Self-pay | Admitting: Adult Health

## 2017-03-14 VITALS — BP 122/78 | HR 111 | Ht 62.0 in | Wt 203.4 lb

## 2017-03-14 DIAGNOSIS — J9611 Chronic respiratory failure with hypoxia: Secondary | ICD-10-CM

## 2017-03-14 DIAGNOSIS — J189 Pneumonia, unspecified organism: Secondary | ICD-10-CM

## 2017-03-14 DIAGNOSIS — J441 Chronic obstructive pulmonary disease with (acute) exacerbation: Secondary | ICD-10-CM

## 2017-03-14 DIAGNOSIS — G4733 Obstructive sleep apnea (adult) (pediatric): Secondary | ICD-10-CM | POA: Diagnosis not present

## 2017-03-14 MED ORDER — METHYLPREDNISOLONE ACETATE 80 MG/ML IJ SUSP
80.0000 mg | Freq: Once | INTRAMUSCULAR | Status: AC
  Administered 2017-03-14: 80 mg via INTRAMUSCULAR

## 2017-03-14 MED ORDER — PREDNISONE 10 MG PO TABS: ORAL_TABLET | ORAL | 0 refills | Status: DC

## 2017-03-14 MED ORDER — DOXYCYCLINE HYCLATE 100 MG PO TABS: 100.0000 mg | ORAL_TABLET | Freq: Two times a day (BID) | ORAL | 0 refills | Status: DC

## 2017-03-14 NOTE — Assessment & Plan Note (Signed)
Recurrent exacerbation in very severe COPD (ongoing smoking intermittently  )   Check cxr   Plan  Patient Instructions  Doxycycline  Twice daily  For 7 days .  Prednisone taper over next week then .daily .  Chest xray today .  Continue on BREO  and Spiriva .  Continue on Oxygen 3-4 lm -wear on continuous flow .  Continue on BIPAP At bedtime with oxygen . And with naps.  Follow up Dr. Marchelle Gearing in 6 weeks and As needed   Please contact office for sooner follow up if symptoms do not improve or worsen or seek emergency care

## 2017-03-14 NOTE — Progress Notes (Signed)
  ID: Mary [name], female    DOB: 1976/08/30, 41 y.o.   MRN: [id number]  No chief complaint on file.   Referring provider: Veryl Speak, FNP  HPI: 41 year old female former smoker followed for very severe gold 4 COPD, compensated by cor pulmonale and chronic hypoxic and hypercarbic respiratory failure on BIPAP /O2 .   03/14/2017 Post hospital follow up : COPD  Patient presents for a post hospital follow-up.  Patient was admitted last month for a COPD exacerbation , Fungemia (1/2 BC Malassezia Pachydermatis)  . Tx w/ IV abx ,steroids , and antifungals. She says she was feeling some better until last 2 days then started getting sore throat , increased chest congestion . Says friend stayed at her house and was sick with URI .  She has been smoking some. , discussed smoking cessation .  She has finished abx and steroid taper. Currently on prednisone  daily  Remains on BREO and Spiriva .  On BIPAP At bedtime   On O2 3l/m .     No Known Allergies  Immunization History  Administered Date(s) Administered  . Influenza Split 01/06/2011, 10/20/2012, 09/06/2013  . Influenza, High Dose Seasonal PF 07/29/2016  . Influenza,inj,Quad PF,36+ Mos 09/27/2014, 09/16/2015  . Pneumococcal Conjugate-13 09/16/2015  . Pneumococcal Polysaccharide-23 01/06/2011  . Tdap 09/06/2013    Past Medical History:  Diagnosis Date  . Axillary adenopathy    right axillary adenopathy noted on CT chest (03/06/2012)  . Chronic right-sided CHF (congestive heart failure) 03/23/2012   with cor pulmonale. Last RHC 03/2012  . COPD (chronic obstructive pulmonary disease) (HCC)    PFTS 03/08/12: fev1 0.58L/1%, FVC 1.18/33%, Ratop 49 and c/w ssevere obstruction. 21% BD response on FVC, RV 219%, DLCO 11/54%  . DVT of upper extremity (deep vein thrombosis) [facility]) April 2013   right subclavian // Unclear precipitating cause - possibly significant right heart failure, with vascular stasis potentially predisposing  to clotting.    . Exertional shortness of breath   . Hepatic cirrhosis (HCC)    Questionable history of - Noted on CT abdomen (03/2012) - thought to be due to vascular congestion from right heart failure +/- patient's history of alcohol abuse  . History of alcoholism (HCC)   . History of chronic bronchitis   . Irregular menses   . Migraine    "~ 1/yr" (05/03/2013)  . Moderate to severe pulmonary hypertension April 2013   Cardiac cath on 03/06/12 - 1. Elevated pulmonary artery pressures, right sided filling pressures.,  2. PA: 64/45 (mean 53)    . On home oxygen therapy    "2-3 L 24/7" (05/03/2013)  . OSA (obstructive sleep apnea)    wears noctural BiPAP (05/03/2013)  . Periodontal disease   . Pneumonia ~ 1985; 01/2011  . Pulmonary embolus [facility]) April 2013   Precipitating cause unclear. Was treated with coumadin from April-June 2013.  . Pulmonary hypertension     Tobacco History: History  Smoking Status  . Current Some Day Smoker  . Packs/day: 1.00  . Years: 25.00  . Types: Cigarettes  . Last attempt to quit: 02/18/2012  Smokeless Tobacco  . Never Used    Comment: smokes 1-2 cigs per day (03/14/17)   Ready to quit: Yes Counseling given: Yes   Outpatient Encounter Prescriptions as of 03/14/2017  Medication Sig  . albuterol (PROVENTIL HFA;VENTOLIN HFA) 108 (90 BASE) MCG/ACT inhaler Inhale 2 puffs into the lungs every 6 (six) hours as needed for wheezing or shortness of  breath.  Marland Kitchen BREO ELLIPTA 100-25 MCG/INH AEPB INHALE 1 PUFF INTO THE LUNGS DAILY.  Marland Kitchen gabapentin (NEURONTIN) 300 MG capsule TAKE 2 CAPSULES BY MOUTH 3 TIMES DAILY.  Marland Kitchen guaiFENesin (MUCINEX) 600 MG 12 hr tablet Take 1 tablet (600 mg total) by mouth 2 (two) times daily.  Marland Kitchen LYRICA 50 MG capsule TAKE 1 CAPSULE BY MOUTH 3 TIMES DAILY  . pantoprazole (PROTONIX) 40 MG tablet Take 1 tablet (40 mg total) by mouth 2 (two) times daily before a meal. (Patient taking differently: Take 40 mg by mouth daily. )  . potassium chloride SA  (K-DUR,KLOR-CON) 20 MEQ tablet Take 1 tablet (20 mEq total) by mouth daily.  . predniSONE (DELTASONE) 5 MG tablet Take  20 mg (4 Tab) qam with breakfast 2 days, then take 10 mg (2 tab) qam with breakfast 3 days; then resume 5 mg daily indefinitely)  . roflumilast (DALIRESP) 500 MCG TABS tablet Take 500 mcg by mouth daily.  Marland Kitchen saccharomyces boulardii (FLORASTOR) 250 MG capsule Take 1 capsule (250 mg total) by mouth 2 (two) times daily.  Marland Kitchen SPIRIVA RESPIMAT 1.25 MCG/ACT AERS INHALE 2 PUFFS INTO THE LUNGS 2 (TWO) TIMES DAILY. (Patient taking differently: Inhale 2 puffs into the lungs daily. )  . torsemide (DEMADEX) 20 MG tablet Take 1 tablet (20 mg total) by mouth daily.  . traMADol (ULTRAM) 50 MG tablet TAKE 1 TABLET BY MOUTH EVERY 6 HOURS AS NEEDED FOR SEVERE PAIN  . doxycycline (VIBRA-TABS) 100 MG tablet Take 1 tablet (100 mg total) by mouth 2 (two) times daily.  . predniSONE (DELTASONE) 10 MG tablet 4 tabs for 2 days, then 3 tabs for 2 days, 2 tabs for 2 days, then 1 tab for 2 days, then  daily  . [DISCONTINUED] glipiZIDE (GLUCOTROL) 5 MG tablet Take 1 tablet (5 mg total) by mouth daily before breakfast. For one week ONLY while on high-dose prednisone (Patient not taking: Reported on 03/14/2017)  . [EXPIRED] methylPREDNISolone acetate (DEPO-MEDROL) injection 80 mg    No facility-administered encounter medications on file as of 03/14/2017.      Review of Systems  Constitutional:   No  weight loss, night sweats,  Fevers, chills, + fatigue, or  lassitude.  HEENT:   No headaches,  Difficulty swallowing,  Tooth/dental problems, or  Sore throat,                No sneezing, itching, ear ache, nasal congestion, post nasal drip,   CV:  No chest pain,  Orthopnea, PND, swelling in lower extremities, anasarca, dizziness, palpitations, syncope.   GI  No heartburn, indigestion, abdominal pain, nausea, vomiting, diarrhea, change in bowel habits, loss of appetite, bloody stools.   Resp:    No chest wall  deformity  Skin: no rash or lesions.  GU: no dysuria, change in color of urine, no urgency or frequency.  No flank pain, no hematuria   MS:  No joint pain or swelling.  No decreased range of motion.  No back pain.    Physical Exam  BP 122/78 (BP Location: Left Arm, Cuff Size: Normal)   Pulse (!) 111   Ht  (1.575 m)   Wt 203 lb 6.4 oz (92.3 kg)   LMP 02/13/2017   SpO2 93%   BMI 37.20 kg/m   GEN: A/Ox3; pleasant , NAD, obese    HEENT:  [place]/AT,  EACs-clear, TMs-wnl, NOSE-clear, THROAT-clear, no lesions, no postnasal drip or exudate noted. Class 2-3 MP airway   NECK:  Supple  w/ fair ROM; no JVD; normal carotid impulses w/o bruits; no thyromegaly or nodules palpated; no lymphadenopathy.    RESP  Decreased BS in bases, . no accessory muscle use, no dullness to percussion  CARD:  RRR, no m/r/g, no peripheral edema, pulses intact, no cyanosis or clubbing.  GI:   Soft & nt; nml bowel sounds; no organomegaly or masses detected.   Musco: Warm bil, no deformities or joint swelling noted.   Neuro: alert, no focal deficits noted.    Skin: Warm, no lesions or rashes    Lab Results:  CBC    Component Value Date/Time   WBC 15.9 (H) 02/13/2017 1524   RBC 4.26 02/13/2017 1524   HGB 13.6 02/13/2017 1524   HCT 42.4 02/13/2017 1524   PLT 155 02/13/2017 1524   MCV 99.5 02/13/2017 1524   MCV 97.1 (A) 03/21/2014 1350   MCH 31.9 02/13/2017 1524   MCHC 32.1 02/13/2017 1524   RDW 13.7 02/13/2017 1524   LYMPHSABS 1.2 02/13/2017 1524   MONOABS 0.6 02/13/2017 1524   EOSABS 0.1 02/13/2017 1524   BASOSABS 0.0 02/13/2017 1524    BMET    Component Value Date/Time   NA 137 02/13/2017 1524   K 3.8 02/13/2017 1524   CL 95 (L) 02/13/2017 1524   CO2 34 (H) 02/13/2017 1524   GLUCOSE 91 02/13/2017 1524   BUN 12 02/13/2017 1524   CREATININE 0.51 02/13/2017 1524   CREATININE 0.65 03/21/2014 1348   CALCIUM 8.9 02/13/2017 1524   GFRNONAA >60 02/13/2017 1524   GFRAA >60 02/13/2017  1524    BNP    Component Value Date/Time   BNP 104.2 (H) 02/09/2017 0340    ProBNP    Component Value Date/Time   PROBNP 30.0 04/20/2016 1703    Imaging: Dg Chest 2 View  Result Date: 03/14/2017 CLINICAL DATA:  Follow-up exam for pneumonia. EXAM: CHEST  2 VIEW COMPARISON:  Prior radiograph from 02/09/2017. FINDINGS: Stable cardiomegaly.  Mediastinal silhouette within normal limits. Lungs are normally inflated. Persistent patchy opacity within the lingula, partially silhouetting left heart border. Changes are improved from previous. No other focal airspace disease. No pulmonary edema. No appreciable pleural effusion. No pneumothorax. No acute osseus abnormality. IMPRESSION: Persistent but improved patchy airspace disease within the lingula, which may reflect persistent and/or improving pneumonia, or possibly recurrent pneumonia. A follow-up radiograph in 3-4 weeks to ensure resolution and complete clearance is recommended. Electronically Signed   By: Rise Mu M.D.   On: 03/14/2017 17:46     Assessment & Plan:   COPD (chronic obstructive pulmonary disease) (HCC) Recurrent exacerbation in very severe COPD (ongoing smoking intermittently  )   Check cxr   Plan  Patient Instructions  Doxycycline  Twice daily  For 7 days .  Prednisone taper over next week then .daily .  Chest xray today .  Continue on BREO  and Spiriva .  Continue on Oxygen 3-4 lm -wear on continuous flow .  Continue on BIPAP At bedtime with oxygen . And with naps.  Follow up Dr. Marchelle Gearing in 6 weeks and As needed   Please contact office for sooner follow up if symptoms do not improve or worsen or seek emergency care         Healthcare-associated pneumonia Check cxr today .   Plan  Patient Instructions  Doxycycline  Twice daily  For 7 days .  Prednisone taper over next week then .daily .  Chest xray today .  Continue on BREO  and Spiriva .  Continue on Oxygen 3-4 lm -wear on  continuous flow .  Continue on BIPAP At bedtime with oxygen . And with naps.  Follow up Dr. Marchelle Gearing in 6 weeks and As needed   Please contact office for sooner follow up if symptoms do not improve or worsen or seek emergency care         Chronic respiratory failure with hypoxia (HCC) Use continuous flow only   Plan Patient Instructions  Doxycycline  Twice daily  For 7 days .  Prednisone taper over next week then .daily .  Chest xray today .  Continue on BREO  and Spiriva .  Continue on Oxygen 3-4 lm -wear on continuous flow .  Continue on BIPAP At bedtime with oxygen . And with naps.  Follow up Dr. Marchelle Gearing in 6 weeks and As needed   Please contact office for sooner follow up if symptoms do not improve or worsen or seek emergency care         OSA on BiPAP Cont on BIPAP At bedtime  And with naps.      Rubye Oaks, NP 03/14/2017

## 2017-03-14 NOTE — Patient Instructions (Addendum)
Doxycycline  Twice daily  For 7 days .  Prednisone taper over next week then .daily .  Chest xray today .  Continue on BREO  and Spiriva .  Continue on Oxygen 3-4 lm -wear on continuous flow .  Continue on BIPAP At bedtime with oxygen . And with naps.  Follow up Dr. Marchelle Gearing in 6 weeks and As needed   Please contact office for sooner follow up if symptoms do not improve or worsen or seek emergency care

## 2017-03-14 NOTE — Assessment & Plan Note (Signed)
Use continuous flow only   Plan Patient Instructions  Doxycycline  Twice daily  For 7 days .  Prednisone taper over next week then .daily .  Chest xray today .  Continue on BREO  and Spiriva .  Continue on Oxygen 3-4 lm -wear on continuous flow .  Continue on BIPAP At bedtime with oxygen . And with naps.  Follow up Dr. Marchelle Gearing in 6 weeks and As needed   Please contact office for sooner follow up if symptoms do not improve or worsen or seek emergency care

## 2017-03-14 NOTE — Assessment & Plan Note (Signed)
Cont on BIPAP At bedtime  And with naps.

## 2017-03-14 NOTE — Assessment & Plan Note (Signed)
Check cxr today .   Plan  Patient Instructions  Doxycycline  Twice daily  For 7 days .  Prednisone taper over next week then .daily .  Chest xray today .  Continue on BREO  and Spiriva .  Continue on Oxygen 3-4 lm -wear on continuous flow .  Continue on BIPAP At bedtime with oxygen . And with naps.  Follow up Dr. Marchelle Gearing in 6 weeks and As needed   Please contact office for sooner follow up if symptoms do not improve or worsen or seek emergency care

## 2017-03-15 ENCOUNTER — Telehealth: Payer: Self-pay | Admitting: Internal Medicine

## 2017-03-15 NOTE — Telephone Encounter (Signed)
Patient calling back regarding results - she can be reached at [phone] -pr

## 2017-03-15 NOTE — Telephone Encounter (Signed)
Notes recorded by Julio Sicks, NP on 03/14/2017 at 5:57 PM EDT CXR shows improved PNA from recent admission  Take abx as per ov  Please contact office for sooner follow up if symptoms do not improve or worsen or seek emergency care  Cont w/ ov recs     Called and spoke with pt and she is aware of results per TP.

## 2017-03-15 NOTE — Telephone Encounter (Signed)
MR--Dr. Abundio Miu is calling to discuss the pts condition with you.  Will forward to MR to make him aware. thanks

## 2017-03-15 NOTE — Telephone Encounter (Signed)
Please have Dr Abundio Miu call me on 03/16/17 after 3pm or sometiime next week on my cell  Thanks  Dr. Kalman Shan, M.D., [facility].C.P Pulmonary and Critical Care Medicine Staff Physician [facility] [place] [facility] [place] Pulmonary and Critical Care Pager: [phone], If no answer or between  15:00h - 7:00h: call [phone]  03/15/2017 11:36 PM

## 2017-03-16 NOTE — Telephone Encounter (Signed)
MR  Spoke with Dr. Abundio Miu, he stated he will be available after 3pm, he gave his number for you to call at [phone].

## 2017-03-17 NOTE — Telephone Encounter (Signed)
DR. Abundio Miu states he will be in meetings today after 3pm..Erica R Ladona Ridgel

## 2017-03-17 NOTE — Telephone Encounter (Signed)
Spoke with Dr. Abundio Miu, and made him aware that MR is night float this week. Dr. Abundio Miu states he will be attending a meeting at 3:00-4:00 and then 4:30-5:30. He is willing to speak with MR after his working hours, and request that MR call at his earliest convenience even if it's 10 o'clock at night.  MR please advise. Thanks.

## 2017-03-21 ENCOUNTER — Other Ambulatory Visit: Payer: Self-pay | Admitting: Family

## 2017-03-21 DIAGNOSIS — E1142 Type 2 diabetes mellitus with diabetic polyneuropathy: Secondary | ICD-10-CM

## 2017-03-21 NOTE — Telephone Encounter (Signed)
MR please advise thanks  

## 2017-03-21 NOTE — Telephone Encounter (Signed)
D/w Dr Abundio Miu - gave details of Jana Hakim disease  Dr. Kalman Shan, M.D., [facility].C.P Pulmonary and Critical Care Medicine Staff Physician [facility] [place] [facility]monary and Critical Care Pager: [phone], If no answer or between  15:00h - 7:00h: call [phone]  03/21/2017 1:38 PM

## 2017-03-21 NOTE — Telephone Encounter (Signed)
Dr. Abundio Miu called to speak with Dr. Marchelle Gearing once again.  Per Dr. Jane Canary note on 03/15/2017 at 11:36 pm - I gave him Dr. Jane Canary pager number and his cell phone #. Dr. Abundio Miu is calling Dr. Marchelle Gearing directly.

## 2017-03-21 NOTE — Telephone Encounter (Signed)
Nothing further is needed. 

## 2017-03-22 NOTE — Telephone Encounter (Signed)
Last refill was 01/18/17 

## 2017-03-24 ENCOUNTER — Encounter (HOSPITAL_COMMUNITY): Payer: Self-pay | Admitting: *Deleted

## 2017-03-24 ENCOUNTER — Emergency Department (HOSPITAL_COMMUNITY): Payer: 59

## 2017-03-24 ENCOUNTER — Inpatient Hospital Stay (HOSPITAL_COMMUNITY)
Admission: EM | Admit: 2017-03-24 | Discharge: 2017-03-28 | DRG: 190 | Disposition: A | Discharge status: Home Health | Payer: 59 | Attending: Internal Medicine | Admitting: Internal Medicine

## 2017-03-24 DIAGNOSIS — Z811 Family history of alcohol abuse and dependence: Secondary | ICD-10-CM

## 2017-03-24 DIAGNOSIS — J9621 Acute and chronic respiratory failure with hypoxia: Secondary | ICD-10-CM | POA: Diagnosis not present

## 2017-03-24 DIAGNOSIS — E118 Type 2 diabetes mellitus with unspecified complications: Secondary | ICD-10-CM | POA: Diagnosis not present

## 2017-03-24 DIAGNOSIS — J441 Chronic obstructive pulmonary disease with (acute) exacerbation: Secondary | ICD-10-CM | POA: Diagnosis not present

## 2017-03-24 DIAGNOSIS — I50812 Chronic right heart failure: Secondary | ICD-10-CM | POA: Diagnosis present

## 2017-03-24 DIAGNOSIS — I2729 Other secondary pulmonary hypertension: Secondary | ICD-10-CM | POA: Diagnosis present

## 2017-03-24 DIAGNOSIS — E1165 Type 2 diabetes mellitus with hyperglycemia: Secondary | ICD-10-CM | POA: Diagnosis present

## 2017-03-24 DIAGNOSIS — K7460 Unspecified cirrhosis of liver: Secondary | ICD-10-CM | POA: Diagnosis present

## 2017-03-24 DIAGNOSIS — Z8701 Personal history of pneumonia (recurrent): Secondary | ICD-10-CM

## 2017-03-24 DIAGNOSIS — I5033 Acute on chronic diastolic (congestive) heart failure: Secondary | ICD-10-CM | POA: Diagnosis present

## 2017-03-24 DIAGNOSIS — J9622 Acute and chronic respiratory failure with hypercapnia: Secondary | ICD-10-CM | POA: Diagnosis present

## 2017-03-24 DIAGNOSIS — Z9981 Dependence on supplemental oxygen: Secondary | ICD-10-CM

## 2017-03-24 DIAGNOSIS — Z7952 Long term (current) use of systemic steroids: Secondary | ICD-10-CM

## 2017-03-24 DIAGNOSIS — E1169 Type 2 diabetes mellitus with other specified complication: Secondary | ICD-10-CM | POA: Diagnosis present

## 2017-03-24 DIAGNOSIS — K219 Gastro-esophageal reflux disease without esophagitis: Secondary | ICD-10-CM | POA: Diagnosis not present

## 2017-03-24 DIAGNOSIS — R0602 Shortness of breath: Secondary | ICD-10-CM | POA: Diagnosis not present

## 2017-03-24 DIAGNOSIS — Z86718 Personal history of other venous thrombosis and embolism: Secondary | ICD-10-CM

## 2017-03-24 DIAGNOSIS — G4733 Obstructive sleep apnea (adult) (pediatric): Secondary | ICD-10-CM | POA: Diagnosis not present

## 2017-03-24 DIAGNOSIS — Z8249 Family history of ischemic heart disease and other diseases of the circulatory system: Secondary | ICD-10-CM

## 2017-03-24 DIAGNOSIS — F17210 Nicotine dependence, cigarettes, uncomplicated: Secondary | ICD-10-CM | POA: Diagnosis present

## 2017-03-24 DIAGNOSIS — D696 Thrombocytopenia, unspecified: Secondary | ICD-10-CM | POA: Diagnosis present

## 2017-03-24 DIAGNOSIS — E662 Morbid (severe) obesity with alveolar hypoventilation: Secondary | ICD-10-CM | POA: Diagnosis present

## 2017-03-24 DIAGNOSIS — I5032 Chronic diastolic (congestive) heart failure: Secondary | ICD-10-CM | POA: Diagnosis not present

## 2017-03-24 DIAGNOSIS — Z86711 Personal history of pulmonary embolism: Secondary | ICD-10-CM

## 2017-03-24 DIAGNOSIS — E669 Obesity, unspecified: Secondary | ICD-10-CM

## 2017-03-24 DIAGNOSIS — E876 Hypokalemia: Secondary | ICD-10-CM | POA: Diagnosis present

## 2017-03-24 DIAGNOSIS — T50995A Adverse effect of other drugs, medicaments and biological substances, initial encounter: Secondary | ICD-10-CM | POA: Diagnosis present

## 2017-03-24 DIAGNOSIS — Z6836 Body mass index (BMI) 36.0-36.9, adult: Secondary | ICD-10-CM

## 2017-03-24 DIAGNOSIS — R05 Cough: Secondary | ICD-10-CM | POA: Diagnosis not present

## 2017-03-24 LAB — CBC
HCT: 45.5 % (ref 36.0–46.0)
Hemoglobin: 13.6 g/dL (ref 12.0–15.0)
MCH: 31.8 pg (ref 26.0–34.0)
MCHC: 29.9 g/dL — ABNORMAL LOW (ref 30.0–36.0)
MCV: 106.3 fL — AB (ref 78.0–100.0)
Platelets: 139 10*3/uL — ABNORMAL LOW (ref 150–400)
RBC: 4.28 MIL/uL (ref 3.87–5.11)
RDW: 15.1 % (ref 11.5–15.5)
WBC: 10.9 10*3/uL — AB (ref 4.0–10.5)

## 2017-03-24 LAB — BASIC METABOLIC PANEL
Anion gap: 10 (ref 5–15)
BUN: 7 mg/dL (ref 6–20)
CHLORIDE: 88 mmol/L — AB (ref 101–111)
CO2: 41 mmol/L — ABNORMAL HIGH (ref 22–32)
CREATININE: 0.53 mg/dL (ref 0.44–1.00)
Calcium: 8.6 mg/dL — ABNORMAL LOW (ref 8.9–10.3)
GFR calc Af Amer: >60 mL/min (ref >60)
Glucose, Bld: 215 mg/dL — ABNORMAL HIGH (ref 65–99)
Potassium: 3.4 mmol/L — ABNORMAL LOW (ref 3.5–5.1)
SODIUM: 139 mmol/L (ref 135–145)

## 2017-03-24 LAB — I-STAT VENOUS BLOOD GAS, ED
Acid-Base Excess: 22 mmol/L — ABNORMAL HIGH (ref 0.0–2.0)
BICARBONATE: 50.1 mmol/L — AB (ref 20.0–28.0)
O2 Saturation: 90 %
PH VEN: 7.475 — AB (ref 7.250–7.430)
TCO2: >50 mmol/L (ref 0–100)
pCO2, Ven: 68.1 mmHg — ABNORMAL HIGH (ref 44.0–60.0)
pO2, Ven: 58 mmHg — ABNORMAL HIGH (ref 32.0–45.0)

## 2017-03-24 LAB — GLUCOSE, CAPILLARY: GLUCOSE-CAPILLARY: 365 mg/dL — AB (ref 65–99)

## 2017-03-24 LAB — I-STAT TROPONIN, ED: Troponin i, poc: 0 ng/mL (ref 0.00–0.08)

## 2017-03-24 MED ORDER — GABAPENTIN 300 MG PO CAPS
600.0000 mg | ORAL_CAPSULE | Freq: Three times a day (TID) | ORAL | Status: DC
  Administered 2017-03-24 – 2017-03-28 (×12): 600 mg via ORAL
  Filled 2017-03-24: qty 6
  Filled 2017-03-24: qty 2
  Filled 2017-03-24 (×2): qty 6
  Filled 2017-03-24 (×2): qty 2
  Filled 2017-03-24: qty 6
  Filled 2017-03-24 (×3): qty 2
  Filled 2017-03-24: qty 6
  Filled 2017-03-24: qty 2
  Filled 2017-03-24: qty 6

## 2017-03-24 MED ORDER — TORSEMIDE 20 MG PO TABS
20.0000 mg | ORAL_TABLET | Freq: Every day | ORAL | Status: DC
  Administered 2017-03-24 – 2017-03-28 (×5): 20 mg via ORAL
  Filled 2017-03-24 (×5): qty 1

## 2017-03-24 MED ORDER — MAGNESIUM SULFATE 2 GM/50ML IV SOLN
2.0000 g | Freq: Once | INTRAVENOUS | Status: AC
  Administered 2017-03-24: 2 g via INTRAVENOUS
  Filled 2017-03-24: qty 50

## 2017-03-24 MED ORDER — INSULIN ASPART 100 UNIT/ML ~~LOC~~ SOLN
0.0000 [IU] | Freq: Every day | SUBCUTANEOUS | Status: DC
  Administered 2017-03-24 – 2017-03-25 (×2): 5 [IU] via SUBCUTANEOUS
  Administered 2017-03-26: 3 [IU] via SUBCUTANEOUS
  Administered 2017-03-27: 2 [IU] via SUBCUTANEOUS

## 2017-03-24 MED ORDER — IPRATROPIUM-ALBUTEROL 0.5-2.5 (3) MG/3ML IN SOLN: 3.0000 mL | RESPIRATORY_TRACT | Status: AC

## 2017-03-24 MED ORDER — ACETAMINOPHEN 650 MG RE SUPP: 650.0000 mg | Freq: Four times a day (QID) | RECTAL | Status: DC | PRN

## 2017-03-24 MED ORDER — PANTOPRAZOLE SODIUM 40 MG PO TBEC
40.0000 mg | DELAYED_RELEASE_TABLET | Freq: Every day | ORAL | Status: DC
  Administered 2017-03-24 – 2017-03-26 (×3): 40 mg via ORAL
  Filled 2017-03-24 (×3): qty 1

## 2017-03-24 MED ORDER — INSULIN ASPART 100 UNIT/ML ~~LOC~~ SOLN
0.0000 [IU] | Freq: Three times a day (TID) | SUBCUTANEOUS | Status: DC
  Administered 2017-03-25: 8 [IU] via SUBCUTANEOUS
  Administered 2017-03-25: 5 [IU] via SUBCUTANEOUS
  Administered 2017-03-25: 11 [IU] via SUBCUTANEOUS
  Administered 2017-03-26: 5 [IU] via SUBCUTANEOUS
  Administered 2017-03-26: 4 [IU] via SUBCUTANEOUS
  Administered 2017-03-26 – 2017-03-27 (×3): 15 [IU] via SUBCUTANEOUS
  Administered 2017-03-27: 3 [IU] via SUBCUTANEOUS
  Administered 2017-03-28: 15 [IU] via SUBCUTANEOUS
  Administered 2017-03-28: 3 [IU] via SUBCUTANEOUS

## 2017-03-24 MED ORDER — IPRATROPIUM-ALBUTEROL 0.5-2.5 (3) MG/3ML IN SOLN
3.0000 mL | Freq: Four times a day (QID) | RESPIRATORY_TRACT | Status: DC
  Administered 2017-03-24 – 2017-03-28 (×13): 3 mL via RESPIRATORY_TRACT
  Filled 2017-03-24 (×12): qty 3

## 2017-03-24 MED ORDER — POTASSIUM CHLORIDE CRYS ER 20 MEQ PO TBCR
20.0000 meq | EXTENDED_RELEASE_TABLET | Freq: Every day | ORAL | Status: DC
  Administered 2017-03-24 – 2017-03-28 (×5): 20 meq via ORAL
  Filled 2017-03-24 (×5): qty 1

## 2017-03-24 MED ORDER — TRAMADOL HCL 50 MG PO TABS
50.0000 mg | ORAL_TABLET | Freq: Four times a day (QID) | ORAL | Status: DC | PRN
  Administered 2017-03-24 – 2017-03-28 (×11): 50 mg via ORAL
  Filled 2017-03-24 (×12): qty 1

## 2017-03-24 MED ORDER — ROFLUMILAST 500 MCG PO TABS
500.0000 ug | ORAL_TABLET | Freq: Every day | ORAL | Status: DC
  Administered 2017-03-24 – 2017-03-28 (×5): 500 ug via ORAL
  Filled 2017-03-24 (×5): qty 1

## 2017-03-24 MED ORDER — ACETAMINOPHEN 325 MG PO TABS: 650.0000 mg | ORAL_TABLET | Freq: Four times a day (QID) | ORAL | Status: DC | PRN

## 2017-03-24 MED ORDER — METHYLPREDNISOLONE SODIUM SUCC 125 MG IJ SOLR
125.0000 mg | Freq: Once | INTRAMUSCULAR | Status: AC
  Administered 2017-03-24: 125 mg via INTRAVENOUS
  Filled 2017-03-24: qty 2

## 2017-03-24 MED ORDER — GUAIFENESIN ER 600 MG PO TB12
1200.0000 mg | ORAL_TABLET | Freq: Two times a day (BID) | ORAL | Status: DC
  Administered 2017-03-24 – 2017-03-28 (×8): 1200 mg via ORAL
  Filled 2017-03-24 (×8): qty 2

## 2017-03-24 MED ORDER — ENOXAPARIN SODIUM 40 MG/0.4ML ~~LOC~~ SOLN
40.0000 mg | SUBCUTANEOUS | Status: DC
  Administered 2017-03-24 – 2017-03-27 (×4): 40 mg via SUBCUTANEOUS
  Filled 2017-03-24 (×5): qty 0.4

## 2017-03-24 MED ORDER — PREGABALIN 25 MG PO CAPS
50.0000 mg | ORAL_CAPSULE | Freq: Three times a day (TID) | ORAL | Status: DC
  Administered 2017-03-24 – 2017-03-28 (×12): 50 mg via ORAL
  Filled 2017-03-24 (×12): qty 2

## 2017-03-24 MED ORDER — METHYLPREDNISOLONE SODIUM SUCC 40 MG IJ SOLR
40.0000 mg | Freq: Two times a day (BID) | INTRAMUSCULAR | Status: DC
  Administered 2017-03-25 – 2017-03-27 (×5): 40 mg via INTRAVENOUS
  Filled 2017-03-24 (×5): qty 1

## 2017-03-24 MED ORDER — ALBUTEROL SULFATE (2.5 MG/3ML) 0.083% IN NEBU
2.5000 mg | INHALATION_SOLUTION | RESPIRATORY_TRACT | Status: DC | PRN
Start: 2017-03-24 — End: 2017-03-28

## 2017-03-24 MED ORDER — IPRATROPIUM-ALBUTEROL 0.5-2.5 (3) MG/3ML IN SOLN
3.0000 mL | RESPIRATORY_TRACT | Status: AC
  Administered 2017-03-24 (×3): 3 mL via RESPIRATORY_TRACT
  Filled 2017-03-24: qty 9

## 2017-03-24 MED ORDER — SODIUM CHLORIDE 0.9% FLUSH
3.0000 mL | Freq: Two times a day (BID) | INTRAVENOUS | Status: DC
  Administered 2017-03-24 – 2017-03-28 (×6): 3 mL via INTRAVENOUS

## 2017-03-24 NOTE — ED Provider Notes (Signed)
[facility]-EMERGENCY DEPT Provider Note   CSN: [id number] Arrival date & time: 03/24/17  1305     History   Chief Complaint Chief Complaint  Patient presents with  . Shortness of Breath  . Fatigue    HPI Mary [name] is a 41 y.o. female.  41 yo F with a chief complaint of shortness of breath. Going on for the past couple weeks worsening over the past few days. Patient has a history of COPD requiring 3 L of oxygen at all times at home. She recently increased her oxygen to 4 and still feels like she's having trouble. She saw her pulmonologist about a week ago and was started on a steroid taper. She finished this yesterday. She also was recently in the hospital and diagnosed with a fungal pneumonia. She denies any recent fevers or chills. Denies chest pain. Is coughing but denies sputum.   The history is provided by the patient.  Shortness of Breath  This is a chronic problem. The average episode lasts 2 days. The problem occurs continuously.The current episode started 2 days ago. The problem has not changed since onset.Associated symptoms include cough. Pertinent negatives include no fever, no headaches, no rhinorrhea, no wheezing, no chest pain and no vomiting. She has tried nothing for the symptoms. The treatment provided no relief. She has had prior hospitalizations. She has had prior ED visits. Associated medical issues include COPD.    Past Medical History:  Diagnosis Date  . Axillary adenopathy    right axillary adenopathy noted on CT chest (03/06/2012)  . Chronic right-sided CHF (congestive heart failure) (HCC) 03/23/2012   with cor pulmonale. Last RHC 03/2012  . COPD (chronic obstructive pulmonary disease) (HCC)    PFTS 03/08/12: fev1 0.58L/1%, FVC 1.18/33%, Ratop 49 and c/w ssevere obstruction. 21% BD response on FVC, RV 219%, DLCO 11/54%  . DVT of upper extremity (deep vein thrombosis) [facility]) April 2013   right subclavian // Unclear precipitating cause - possibly significant  right heart failure, with vascular stasis potentially predisposing to clotting.    . Exertional shortness of breath   . Hepatic cirrhosis (HCC)    Questionable history of - Noted on CT abdomen (03/2012) - thought to be due to vascular congestion from right heart failure +/- patient's history of alcohol abuse  . History of alcoholism (HCC)   . History of chronic bronchitis   . Irregular menses   . Migraine    "~ 1/yr" (05/03/2013)  . Moderate to severe pulmonary hypertension [facility]) April 2013   Cardiac cath on 03/06/12 - 1. Elevated pulmonary artery pressures, right sided filling pressures.,  2. PA: 64/45 (mean 53)    . On home oxygen therapy    "2-3 L 24/7" (05/03/2013)  . OSA (obstructive sleep apnea)    wears noctural BiPAP (05/03/2013)  . Periodontal disease   . Pneumonia ~ 1985; 01/2011  . Pulmonary embolus [facility]) April 2013   Precipitating cause unclear. Was treated with coumadin from April-June 2013.  . Pulmonary hypertension [name])     Patient Active Problem List   Diagnosis Date Noted  . Fungemia 02/13/2017  . COPD (chronic obstructive pulmonary disease) (HCC) 02/13/2017  . Chronic respiratory failure with hypoxia and hypercapnia (HCC) 02/13/2017  . Healthcare-associated pneumonia 02/09/2017  . Acute on chronic diastolic CHF (congestive heart failure) (HCC) 01/31/2017  . Alcohol abuse 01/31/2017  . Acute on chronic respiratory failure with hypoxia and hypercapnia (HCC)   . Tracheomalacia   . Palliative care by specialist   .  Anxiety state   . Advance directive declined by patient   . Goals of care, counseling/discussion   . Chronic hoarseness 07/27/2016  . Diabetic neuropathy (HCC) 04/30/2016  . Diabetes mellitus type 2 in obese (HCC) 04/10/2016  . COPD exacerbation (HCC) 04/01/2016  . Chronic diastolic heart failure (HCC) 01/25/2016  . Chronic respiratory failure with hypoxia (HCC) 01/21/2016  . PAH (pulmonary artery hypertension) (HCC)   . Constipation 11/18/2015  .  Morbid obesity (HCC) 11/18/2015  . Lung nodule 01/30/2015  . Pulmonary mass 01/13/2015  . Gastroparesis   . Esophageal reflux   . Obstipation   . Hypokalemia 02/28/2013  . Financial difficulties 02/28/2013  . Obesity hypoventilation syndrome (HCC) 02/28/2013  . Myalgia 02/28/2013  . Anovulation 06/30/2012  . Preventative health care 05/05/2012  . Irregular menstrual cycle 04/27/2012  . Chronic right-sided CHF (congestive heart failure) (HCC) 03/23/2012  . Pulmonary hypertension (HCC) 03/23/2012  . Cor pulmonale (HCC) 03/21/2012  . Chronic respiratory failure with hypercapnia (HCC) 03/21/2012  . OSA on BiPAP 03/19/2012  . Axillary lymphadenopathy 03/06/2012  . Congenital heart disease 01/28/2012  . Dental caries 01/28/2012    Past Surgical History:  Procedure Laterality Date  . APPENDECTOMY  05/03/2013  . CARDIAC CATHETERIZATION  03/2012  . CARDIAC CATHETERIZATION N/A 01/08/2016   Procedure: Right Heart Cath;  Surgeon: Dolores Patty, MD;  Location: [facility] INVASIVE CV LAB;  Service: Cardiovascular;  Laterality: N/A;  . CARDIAC SURGERY  01-Aug-1977   "my heart was backwards" (05/03/2013)  . LAPAROSCOPIC APPENDECTOMY N/A 05/03/2013   Procedure: APPENDECTOMY LAPAROSCOPIC;  Surgeon: Cherylynn Ridges, MD;  Location: [facility] OR;  Service: General;  Laterality: N/A;  . LUNG SURGERY  06/18/77  . RIGHT HEART CATHETERIZATION N/A 03/07/2012   Procedure: RIGHT HEART CATH;  Surgeon: Kathleene Hazel, MD;  Location: [facility] CATH LAB;  Service: Cardiovascular;  Laterality: N/A;    OB History    No data available       Home Medications    Prior to Admission medications   Medication Sig Start Date End Date Taking? Authorizing Provider  albuterol (PROVENTIL HFA;VENTOLIN HFA) 108 (90 BASE) MCG/ACT inhaler Inhale 2 puffs into the lungs every 6 (six) hours as needed for wheezing or shortness of breath. 09/06/14  Yes Kalman Shan, MD  BREO ELLIPTA 100-25 MCG/INH AEPB INHALE 1 PUFF INTO THE LUNGS  DAILY. 06/03/16  Yes Kalman Shan, MD  gabapentin (NEURONTIN) 300 MG capsule TAKE 2 CAPSULES BY MOUTH 3 TIMES DAILY. 12/14/16  Yes Veryl Speak, FNP  guaiFENesin (MUCINEX) 600 MG 12 hr tablet Take 1 tablet (600 mg total) by mouth 2 (two) times daily. 02/11/17  Yes Vassie Loll, MD  LYRICA 50 MG capsule TAKE 1 CAPSULE BY MOUTH 3 TIMES DAILY 02/07/17  Yes Veryl Speak, FNP  pantoprazole (PROTONIX) 40 MG tablet Take 1 tablet (40 mg total) by mouth 2 (two) times daily before a meal. Patient taking differently: Take 40 mg by mouth daily.  07/22/16  Yes Jeralyn Bennett, MD  potassium chloride SA (K-DUR,KLOR-CON) 20 MEQ tablet Take 1 tablet (20 mEq total) by mouth daily. 01/29/16  Yes Dolores Patty, MD  roflumilast (DALIRESP) 500 MCG TABS tablet Take 500 mcg by mouth daily.   Yes Historical Provider, MD  SPIRIVA RESPIMAT 1.25 MCG/ACT AERS INHALE 2 PUFFS INTO THE LUNGS 2 (TWO) TIMES DAILY. Patient taking differently: Inhale 2 puffs into the lungs daily.  08/16/16  Yes Kalman Shan, MD  torsemide (DEMADEX) 20 MG tablet Take 1 tablet (  20 mg total) by mouth daily. 12/03/16  Yes Kalman Shan, MD  traMADol (ULTRAM) 50 MG tablet TAKE 1 TABLET EVERY 6 HOURS AS NEEDED FOR SEVERE PAIN 03/23/17  Yes Veryl Speak, FNP  doxycycline (VIBRA-TABS) 100 MG tablet Take 1 tablet (100 mg total) by mouth 2 (two) times daily. Patient not taking: Reported on 03/24/2017 03/14/17   Tammy S Parrett, NP  predniSONE (DELTASONE) 10 MG tablet 4 tabs for 2 days, then 3 tabs for 2 days, 2 tabs for 2 days, then 1 tab for 2 days, then  daily Patient not taking: Reported on 03/24/2017 03/14/17   Tammy S Parrett, NP  predniSONE (DELTASONE) 5 MG tablet Take  20 mg (4 Tab) qam with breakfast 2 days, then take 10 mg (2 tab) qam with breakfast 3 days; then resume 5 mg daily indefinitely) Patient taking differently: Take 5 mg by mouth daily.  02/15/17   Vassie Loll, MD  saccharomyces boulardii (FLORASTOR) 250 MG capsule Take 1  capsule (250 mg total) by mouth 2 (two) times daily. Patient not taking: Reported on 03/24/2017 02/11/17   Vassie Loll, MD    Family History Family History  Problem Relation Age of Onset  . Hypertension Father   . Heart disease Father     CHF; died age 61   . Alcohol abuse Father   . Other Brother     died age 1 y.o overdose    Social History Social History  Substance Use Topics  . Smoking status: Current Some Day Smoker    Packs/day: 1.00    Years: 25.00    Types: Cigarettes    Last attempt to quit: 02/18/2012  . Smokeless tobacco: Never Used     Comment: smokes 1-2 cigs per day (03/14/17)  . Alcohol use No     Comment: previously drinking 1 pint 3-4 days a week for 2002-2013, but in 2014, occasional drinking now      Allergies   Patient has no known allergies.   Review of Systems Review of Systems  Constitutional: Negative for chills and fever.  HENT: Negative for congestion and rhinorrhea.   Eyes: Negative for redness and visual disturbance.  Respiratory: Positive for cough and shortness of breath. Negative for wheezing.   Cardiovascular: Negative for chest pain and palpitations.  Gastrointestinal: Negative for nausea and vomiting.  Genitourinary: Negative for dysuria and urgency.  Musculoskeletal: Negative for arthralgias and myalgias.  Skin: Negative for pallor and wound.  Neurological: Negative for dizziness and headaches.     Physical Exam Updated Vital Signs BP 113/63   Pulse 97   Temp 98.3 F (36.8 C) (Oral)   Resp 16   LMP 03/17/2017   SpO2 (!) 88%   Physical Exam  Constitutional: She is oriented to person, place, and time. She appears well-developed and well-nourished. No distress.  Chronically ill-appearing  HENT:  Head: Normocephalic and atraumatic.  Eyes: EOM are normal. Pupils are equal, round, and reactive to light.  Neck: Normal range of motion. Neck supple.  Cardiovascular: Normal rate and regular rhythm.  Exam reveals no gallop and no  friction rub.   No murmur heard. Pulmonary/Chest: Effort normal. She has no wheezes. She has no rales.  Abdominal: Soft. She exhibits no distension and no mass. There is no tenderness. There is no guarding.  Musculoskeletal: She exhibits no edema or tenderness.  Neurological: She is alert and oriented to person, place, and time.  Mildly sleepy on exam. Speaking in 7-8 word sentences  Skin:  Skin is warm and dry. She is not diaphoretic.  Psychiatric: She has a normal mood and affect. Her behavior is normal.  Nursing note and vitals reviewed.    ED Treatments / Results  Labs (all labs ordered are listed, but only abnormal results are displayed) Labs Reviewed  BASIC METABOLIC PANEL - Abnormal; Notable for the following:       Result Value   Potassium 3.4 (*)    Chloride 88 (*)    CO2 41 (*)    Glucose, Bld 215 (*)    Calcium 8.6 (*)    All other components within normal limits  CBC - Abnormal; Notable for the following:    WBC 10.9 (*)    MCV 106.3 (*)    MCHC 29.9 (*)    Platelets 139 (*)    All other components within normal limits  I-STAT VENOUS BLOOD GAS, ED - Abnormal; Notable for the following:    pH, Ven 7.475 (*)    pCO2, Ven 68.1 (*)    pO2, Ven 58.0 (*)    Bicarbonate 50.1 (*)    Acid-Base Excess 22.0 (*)    All other components within normal limits  CULTURE, BLOOD (ROUTINE X 2)  CULTURE, BLOOD (ROUTINE X 2)  BLOOD GAS, VENOUS  I-STAT TROPOININ, ED    EKG  EKG Interpretation  Date/Time:  Thursday March 24 2017 13:14:41 EDT Ventricular Rate:  105 PR Interval:  154 QRS Duration: 76 QT Interval:  356 QTC Calculation: 470 R Axis:   108 Text Interpretation:  Sinus tachycardia Biatrial enlargement Rightward axis Abnormal ECG No significant change since last tracing Confirmed by Dinita Migliaccio MD, Reuel Boom [phone]) on 03/24/2017 2:12:51 PM       Radiology Dg Chest 2 View  Result Date: 03/24/2017 CLINICAL DATA:  Weakness, cough and shortness of breath. EXAM: CHEST  2  VIEW COMPARISON:  Chest x-ray 03/14/2017 FINDINGS: The heart is mildly enlarged but stable. Prominent left pulmonary hilum due to prominent pulmonary arteries. Some persistent left lower lobe atelectasis or infiltrate. No pleural effusion. The bony thorax is intact. IMPRESSION: Residual streaky left basilar atelectasis or resolving infiltrate. No pleural effusion. Electronically Signed   By: Rudie Meyer M.D.   On: 03/24/2017 15:00    Procedures Procedures (including critical care time)  Medications Ordered in ED Medications  ipratropium-albuterol (DUONEB) 0.5-2.5 (3) MG/3ML nebulizer solution 3 mL ( Nebulization Canceled Entry 03/24/17 1506)  methylPREDNISolone sodium succinate (SOLU-MEDROL) 125 mg/2 mL injection 125 mg (125 mg Intravenous Given 03/24/17 1508)  magnesium sulfate IVPB 2 g 50 mL (0 g Intravenous Stopped 03/24/17 1608)  ipratropium-albuterol (DUONEB) 0.5-2.5 (3) MG/3ML nebulizer solution 3 mL (3 mLs Nebulization Given 03/24/17 1521)     Initial Impression / Assessment and Plan / ED Course  I have reviewed the triage vital signs and the nursing notes.  Pertinent labs & imaging results that were available during my care of the patient were reviewed by me and considered in my medical decision making (see chart for details).     41 yo F With a chief complaint of shortness of breath. Patient is chronically ill-appearing on exam. Diffuse wheezes. Will give breathing treatments and reassess.  Patient still feeling very tired on exam. Lungs with some interval improvement. Will discuss with hospitalist.  CRITICAL CARE Performed by: Rae Roam   Total critical care time: 35 minutes  Critical care time was exclusive of separately billable procedures and treating other patients.  Critical care was necessary to treat or  prevent imminent or life-threatening deterioration.  Critical care was time spent personally by me on the following activities: development of treatment  plan with patient and/or surrogate as well as nursing, discussions with consultants, evaluation of patient's response to treatment, examination of patient, obtaining history from patient or surrogate, ordering and performing treatments and interventions, ordering and review of laboratory studies, ordering and review of radiographic studies, pulse oximetry and re-evaluation of patient's condition.  The patients results and plan were reviewed and discussed.   Any x-rays performed were independently reviewed by myself.   Differential diagnosis were considered with the presenting HPI.  Medications  ipratropium-albuterol (DUONEB) 0.5-2.5 (3) MG/3ML nebulizer solution 3 mL ( Nebulization Canceled Entry 03/24/17 1506)  methylPREDNISolone sodium succinate (SOLU-MEDROL) 125 mg/2 mL injection 125 mg (125 mg Intravenous Given 03/24/17 1508)  magnesium sulfate IVPB 2 g 50 mL (0 g Intravenous Stopped 03/24/17 1608)  ipratropium-albuterol (DUONEB) 0.5-2.5 (3) MG/3ML nebulizer solution 3 mL (3 mLs Nebulization Given 03/24/17 1521)    Vitals:   03/24/17 1530 03/24/17 1537 03/24/17 1545 03/24/17 1600  BP: 117/75 113/69 120/65 113/63  Pulse: 95 98 (!) 102 97  Resp: (!) 23 18 (!) 21 16  Temp:      TempSrc:      SpO2: 99% 97% (!) 89% (!) 88%    Final diagnoses:  COPD exacerbation (HCC)    Admission/ observation were discussed with the admitting physician, patient and/or family and they are comfortable with the plan.   Final Clinical Impressions(s) / ED Diagnoses   Final diagnoses:  COPD exacerbation [facility])    New Prescriptions New Prescriptions   No medications on file     Melene Plan, DO 03/24/17 1634

## 2017-03-24 NOTE — Progress Notes (Signed)
Switched pt. bipap settings to accommodate for pt. desating.

## 2017-03-24 NOTE — H&P (Addendum)
History and Physical    Mary [name] AVW:[id number] DOB: 28-Aug-1976 DOA: 03/24/2017  PCP: Jeanine Luz, FNP   I have briefly reviewed patients previous medical reports in [facility].  Patient coming from: Home  Chief Complaint: worsening dyspnea, weakness, sleepy  HPI: Mary [name] is a 41 year old female with PMH of severe COPD (Dr. Marchelle Gearing), oxygen dependent (2.5-3 L/m in the past but for the last month on 4 L/m), steroid dependent (prednisone 5 MG daily), ongoing tobacco abuse, alcohol use, remote PE and completed Coumadin anticoagulation, diet-controlled DM 2, chronic right-sided/diastolic CHF, OSA on nightly BiPAP, heart/lung surgery at birth, multiple hospitalizations (4 in the last 6 months), recent hospitalization between 02/13/17 - 02/15/17 when she was readmitted for fungemia, ID was consulted and she was discharged home to complete 11 days of Diflucan, presented to ED with above complaints. She states that she has generally not been feeling well for the last 4 weeks and has been on higher dose of oxygen. She was seen by her pulmonologist on 03/14/17 for COPD exacerbation and completed one-week course of doxycycline and a steroid taper day prior to this admission. For the last couple of days, patient has been feeling some worsening dyspnea, intermittent dry cough without chest pain, fever or chills, generalized weakness and has been laying mostly in bed, feels sleepy all the time. Nausea without vomiting. Today apparently her significant other tried to call her from work and she did not answer the phone and he came home to check on her. She denies headache, earache, sore throat, chest pain, abdominal pain, constipation, diarrhea, urinary frequency, dysuria, skin rash. She denies sick contacts. States that she smoked one or 2 cigarettes 4 days ago. Claims compliance with her medications including diuretics. Does not weigh herself at home. Denies excess salt intake.  ED Course:  Hypoxic at 88% on oxygen. Lab work significant for MCV 106.3, platelets 139, potassium 3.4, chloride 88, bicarbonate 41, glucose 215, chest x-ray shows residual streaky left basilar atelectasis or resolving infiltrateand a venous blood gas significant for pH 7.475, PCO2 68, PO2 58, bicarbonate 50.1 and oxygen saturation 90%. She received a dose of IV Solu-Medrol 125 MG IV 1. Overall feels slightly better.  Review of Systems:  All other systems reviewed and apart from HPI, are negative.  Past Medical History:  Diagnosis Date  . Axillary adenopathy    right axillary adenopathy noted on CT chest (03/06/2012)  . Chronic right-sided CHF (congestive heart failure) (HCC) 03/23/2012   with cor pulmonale. Last RHC 03/2012  . COPD (chronic obstructive pulmonary disease) (HCC)    PFTS 03/08/12: fev1 0.58L/1%, FVC 1.18/33%, Ratop 49 and c/w ssevere obstruction. 21% BD response on FVC, RV 219%, DLCO 11/54%  . DVT of upper extremity (deep vein thrombosis) [facility]) April 2013   right subclavian // Unclear precipitating cause - possibly significant right heart failure, with vascular stasis potentially predisposing to clotting.    . Exertional shortness of breath   . Hepatic cirrhosis (HCC)    Questionable history of - Noted on CT abdomen (03/2012) - thought to be due to vascular congestion from right heart failure +/- patient's history of alcohol abuse  . History of alcoholism (HCC)   . History of chronic bronchitis   . Irregular menses   . Migraine    "~ 1/yr" (05/03/2013)  . Moderate to severe pulmonary hypertension [facility]) April 2013   Cardiac cath on 03/06/12 - 1. Elevated pulmonary artery pressures, right sided filling pressures.,  2. PA:  64/45 (mean 53)    . On home oxygen therapy    "2-3 L 24/7" (05/03/2013)  . OSA (obstructive sleep apnea)    wears noctural BiPAP (05/03/2013)  . Periodontal disease   . Pneumonia ~ 1985; 01/2011  . Pulmonary embolus [facility]) April 2013   Precipitating cause unclear. Was  treated with coumadin from April-June 2013.  . Pulmonary hypertension (HCC)     Past Surgical History:  Procedure Laterality Date  . APPENDECTOMY  05/03/2013  . CARDIAC CATHETERIZATION  03/2012  . CARDIAC CATHETERIZATION N/A 01/08/2016   Procedure: Right Heart Cath;  Surgeon: Dolores Patty, MD;  Location: [facility] INVASIVE CV LAB;  Service: Cardiovascular;  Laterality: N/A;  . CARDIAC SURGERY  06/12/1977   "my heart was backwards" (05/03/2013)  . LAPAROSCOPIC APPENDECTOMY N/A 05/03/2013   Procedure: APPENDECTOMY LAPAROSCOPIC;  Surgeon: Cherylynn Ridges, MD;  Location: [facility] OR;  Service: General;  Laterality: N/A;  . LUNG SURGERY  01-12-77  . RIGHT HEART CATHETERIZATION N/A 03/07/2012   Procedure: RIGHT HEART CATH;  Surgeon: Kathleene Hazel, MD;  Location: [facility] CATH LAB;  Service: Cardiovascular;  Laterality: N/A;    Social History  reports that she has been smoking Cigarettes.  She has a 25.00 pack-year smoking history. She has never used smokeless tobacco. She reports that she does not drink alcohol or use drugs.  No Known Allergies  Family History  Problem Relation Age of Onset  . Hypertension Father   . Heart disease Father     CHF; died age 40   . Alcohol abuse Father   . Other Brother     died age 15 y.o overdose     Prior to Admission medications   Medication Sig Start Date End Date Taking? Authorizing Provider  albuterol (PROVENTIL HFA;VENTOLIN HFA) 108 (90 BASE) MCG/ACT inhaler Inhale 2 puffs into the lungs every 6 (six) hours as needed for wheezing or shortness of breath. 09/06/14  Yes Kalman Shan, MD  BREO ELLIPTA 100-25 MCG/INH AEPB INHALE 1 PUFF INTO THE LUNGS DAILY. 06/03/16  Yes Kalman Shan, MD  gabapentin (NEURONTIN) 300 MG capsule TAKE 2 CAPSULES BY MOUTH 3 TIMES DAILY. 12/14/16  Yes Veryl Speak, FNP  guaiFENesin (MUCINEX) 600 MG 12 hr tablet Take 1 tablet (600 mg total) by mouth 2 (two) times daily. 02/11/17  Yes Vassie Loll, MD  LYRICA 50 MG capsule TAKE  1 CAPSULE BY MOUTH 3 TIMES DAILY 02/07/17  Yes Veryl Speak, FNP  pantoprazole (PROTONIX) 40 MG tablet Take 1 tablet (40 mg total) by mouth 2 (two) times daily before a meal. Patient taking differently: Take 40 mg by mouth daily.  07/22/16  Yes Jeralyn Bennett, MD  potassium chloride SA (K-DUR,KLOR-CON) 20 MEQ tablet Take 1 tablet (20 mEq total) by mouth daily. 01/29/16  Yes Dolores Patty, MD  roflumilast (DALIRESP) 500 MCG TABS tablet Take 500 mcg by mouth daily.   Yes Historical Provider, MD  SPIRIVA RESPIMAT 1.25 MCG/ACT AERS INHALE 2 PUFFS INTO THE LUNGS 2 (TWO) TIMES DAILY. Patient taking differently: Inhale 2 puffs into the lungs daily.  08/16/16  Yes Kalman Shan, MD  torsemide (DEMADEX) 20 MG tablet Take 1 tablet (20 mg total) by mouth daily. 12/03/16  Yes Kalman Shan, MD  traMADol (ULTRAM) 50 MG tablet TAKE 1 TABLET EVERY 6 HOURS AS NEEDED FOR SEVERE PAIN 03/23/17  Yes Veryl Speak, FNP  predniSONE (DELTASONE) 5 MG tablet Take  20 mg (4 Tab) qam with breakfast 2 days, then  take 10 mg (2 tab) qam with breakfast 3 days; then resume 5 mg daily indefinitely) Patient taking differently: Take 5 mg by mouth daily.  02/15/17   Vassie Loll, MD    Physical Exam: Vitals:   03/24/17 1545 03/24/17 1600 03/24/17 1630 03/24/17 1700  BP: 120/65 113/63 125/77 (!) 129/95  Pulse: (!) 102 97 96 88  Resp: (!) 21 16 (!) 21 18  Temp:      TempSrc:      SpO2: (!) 89% (!) 88% 91% 93%      Constitutional: Pleasant young female, moderately built and obese, lying comfortably propped up in the gurney in the ED without distress. Eyes: PERTLA, lids and conjunctivae normal ENMT: Mucous membranes are moist. Posterior pharynx clear of any exudate or lesions. Normal dentition.  Neck: supple, no masses, no thyromegaly. Thick neck. Respiratory: Slightly diminished breath sounds bilaterally with scattered few expiratory rhonchi and occasional basal crackles. Normal respiratory effort. No  accessory muscle use. Able to speak in full sentences. Cardiovascular: S1 & S2 heard, regular rate and rhythm, no murmurs / rubs / gallops. Trace bilateral ankle edema. 2+ pedal pulses. No carotid bruits.  Abdomen: No distension but obese, no tenderness, no masses palpated. No hepatosplenomegaly. Bowel sounds normal.  Musculoskeletal: no clubbing / cyanosis. No joint deformity upper and lower extremities. Good ROM, no contractures. Normal muscle tone.  Skin: no rashes, lesions, ulcers. No induration. Soles of her feet are discolored black and she states that this is due to walking barefoot at home and around. Neurologic: CN 2-12 grossly intact. Sensation intact, DTR normal. Strength 5/5 in all 4 limbs.  Psychiatric: Normal judgment and insight. Alert and oriented x 3. Normal mood.     Labs on Admission: I have personally reviewed following labs and imaging studies  CBC:  Recent Labs Lab 03/24/17 1354  WBC 10.9*  HGB 13.6  HCT 45.5  MCV 106.3*  PLT 139*   Basic Metabolic Panel:  Recent Labs Lab 03/24/17 1354  NA 139  K 3.4*  CL 88*  CO2 41*  GLUCOSE 215*  BUN 7  CREATININE 0.53  CALCIUM 8.6*      Radiological Exams on Admission: Dg Chest 2 View  Result Date: 03/24/2017 CLINICAL DATA:  Weakness, cough and shortness of breath. EXAM: CHEST  2 VIEW COMPARISON:  Chest x-ray 03/14/2017 FINDINGS: The heart is mildly enlarged but stable. Prominent left pulmonary hilum due to prominent pulmonary arteries. Some persistent left lower lobe atelectasis or infiltrate. No pleural effusion. The bony thorax is intact. IMPRESSION: Residual streaky left basilar atelectasis or resolving infiltrate. No pleural effusion. Electronically Signed   By: Rudie Meyer M.D.   On: 03/24/2017 15:00    EKG: Independently reviewed. Sinus tachycardia at 105 bpm without acute changes.  Assessment/Plan Principal Problem:   COPD exacerbation (HCC) Active Problems:   OSA on BiPAP   Chronic right-sided  CHF (congestive heart failure) (HCC)   Obesity hypoventilation syndrome (HCC)   Esophageal reflux   Morbid obesity (HCC)   Chronic diastolic heart failure (HCC)   Diabetes mellitus type 2 in obese (HCC)   Acute on chronic respiratory failure with hypoxia and hypercapnia (HCC)     1. Acute COPD exacerbation: Patient is home oxygen and steroid dependent at baseline. Severe COPD which is likely progressing in the context of ongoing tobacco abuse. Tobacco cessation counseled again. Continue IV Solu-Medrol 40 mg every 12 hours. Titrate oxygen to maintain saturations between 89-92 percent. Continue BiPAP at night. Duo nebs  every 6 hourly and when necessary albuterol nebulizations. Flutter valve added. Just completed an oral prednisone taper and course of doxycycline yesterday. Low index of clinical suspicion for infectious etiology and hence no antibiotics initiated. Recent pneumonia has clinically and radiologically improved/resolved. Consult pulmonology in a.m. for further input. 2. Acute on chronic hypoxic and hypercapnic respiratory failure: Precipitated by COPD exacerbation. Management as above. 3. Chronic right heart/diastolic CHF: Not significantly volume overloaded. Continue home dose of diuretics and potassium supplements. 4. OSA/OHS on nightly BiPAP: Continue. 5. Tobacco abuse: Absolute cessation was counseled again. 6. Uncontrolled type II DM: Not on medications at home. Place on SSI while hospitalized. 7. GERD: PPI 8. Morbid obesity/There is no height or weight on file to calculate BMI.  9. Recently treated fungemia 10. Hypokalemia: Replace and follow. 11. Thrombocytopenia: Unclear etiology. Follow CBCs 12. ? Sleepiness: unclear etiology. ? Meds, hypercapnia. Currently alert and oriented.   DVT prophylaxis: Lovenox  Code Status: Full  Family Communication: Significant other at bedside  Disposition Plan: DC home when medically improved, possibly 4/20  Consults called: None    Admission status: Observation, stepdown unit.    [facility] MD [facility] Pager [phone]  If 7PM-7AM, please contact night-coverage [email or web address] Password [email or web address]  03/24/2017, 5:28 PM

## 2017-03-24 NOTE — ED Triage Notes (Signed)
Pt reports increase in fatigue for several days. Wears home o2 due to copd and pulmonary htn. Pt recently having to increase her home o2 due to low spo2, reports recent non productive cough. Reports hx of similar symptoms in past and fatigue was due to abnormal blood gas.

## 2017-03-25 DIAGNOSIS — D696 Thrombocytopenia, unspecified: Secondary | ICD-10-CM | POA: Diagnosis present

## 2017-03-25 DIAGNOSIS — J9622 Acute and chronic respiratory failure with hypercapnia: Secondary | ICD-10-CM | POA: Diagnosis not present

## 2017-03-25 DIAGNOSIS — I50812 Chronic right heart failure: Secondary | ICD-10-CM | POA: Diagnosis not present

## 2017-03-25 DIAGNOSIS — J441 Chronic obstructive pulmonary disease with (acute) exacerbation: Secondary | ICD-10-CM | POA: Diagnosis not present

## 2017-03-25 DIAGNOSIS — E662 Morbid (severe) obesity with alveolar hypoventilation: Secondary | ICD-10-CM | POA: Diagnosis not present

## 2017-03-25 DIAGNOSIS — Z86711 Personal history of pulmonary embolism: Secondary | ICD-10-CM | POA: Diagnosis not present

## 2017-03-25 DIAGNOSIS — G4733 Obstructive sleep apnea (adult) (pediatric): Secondary | ICD-10-CM | POA: Diagnosis not present

## 2017-03-25 DIAGNOSIS — Z8249 Family history of ischemic heart disease and other diseases of the circulatory system: Secondary | ICD-10-CM | POA: Diagnosis not present

## 2017-03-25 DIAGNOSIS — T50995A Adverse effect of other drugs, medicaments and biological substances, initial encounter: Secondary | ICD-10-CM | POA: Diagnosis present

## 2017-03-25 DIAGNOSIS — E1169 Type 2 diabetes mellitus with other specified complication: Secondary | ICD-10-CM | POA: Diagnosis not present

## 2017-03-25 DIAGNOSIS — E1165 Type 2 diabetes mellitus with hyperglycemia: Secondary | ICD-10-CM | POA: Diagnosis present

## 2017-03-25 DIAGNOSIS — E669 Obesity, unspecified: Secondary | ICD-10-CM | POA: Diagnosis not present

## 2017-03-25 DIAGNOSIS — Z6836 Body mass index (BMI) 36.0-36.9, adult: Secondary | ICD-10-CM | POA: Diagnosis not present

## 2017-03-25 DIAGNOSIS — E876 Hypokalemia: Secondary | ICD-10-CM | POA: Diagnosis present

## 2017-03-25 DIAGNOSIS — Z811 Family history of alcohol abuse and dependence: Secondary | ICD-10-CM | POA: Diagnosis not present

## 2017-03-25 DIAGNOSIS — K219 Gastro-esophageal reflux disease without esophagitis: Secondary | ICD-10-CM | POA: Diagnosis present

## 2017-03-25 DIAGNOSIS — F17210 Nicotine dependence, cigarettes, uncomplicated: Secondary | ICD-10-CM | POA: Diagnosis present

## 2017-03-25 DIAGNOSIS — J9621 Acute and chronic respiratory failure with hypoxia: Secondary | ICD-10-CM | POA: Diagnosis not present

## 2017-03-25 DIAGNOSIS — K7460 Unspecified cirrhosis of liver: Secondary | ICD-10-CM | POA: Diagnosis present

## 2017-03-25 DIAGNOSIS — Z86718 Personal history of other venous thrombosis and embolism: Secondary | ICD-10-CM | POA: Diagnosis not present

## 2017-03-25 DIAGNOSIS — I2729 Other secondary pulmonary hypertension: Secondary | ICD-10-CM | POA: Diagnosis present

## 2017-03-25 DIAGNOSIS — I5032 Chronic diastolic (congestive) heart failure: Secondary | ICD-10-CM | POA: Diagnosis not present

## 2017-03-25 DIAGNOSIS — Z9981 Dependence on supplemental oxygen: Secondary | ICD-10-CM | POA: Diagnosis not present

## 2017-03-25 DIAGNOSIS — Z8701 Personal history of pneumonia (recurrent): Secondary | ICD-10-CM | POA: Diagnosis not present

## 2017-03-25 DIAGNOSIS — Z7952 Long term (current) use of systemic steroids: Secondary | ICD-10-CM | POA: Diagnosis not present

## 2017-03-25 LAB — GLUCOSE, CAPILLARY
GLUCOSE-CAPILLARY: 328 mg/dL — AB (ref 65–99)
Glucose-Capillary: 289 mg/dL — ABNORMAL HIGH (ref 65–99)
Glucose-Capillary: 220 mg/dL — ABNORMAL HIGH (ref 65–99)
Glucose-Capillary: 382 mg/dL — ABNORMAL HIGH (ref 65–99)

## 2017-03-25 LAB — CBC
HCT: 45.2 % (ref 36.0–46.0)
Hemoglobin: 13.5 g/dL (ref 12.0–15.0)
MCH: 31.1 pg (ref 26.0–34.0)
MCHC: 29.9 g/dL — AB (ref 30.0–36.0)
MCV: 104.1 fL — ABNORMAL HIGH (ref 78.0–100.0)
PLATELETS: 139 10*3/uL — AB (ref 150–400)
RBC: 4.34 MIL/uL (ref 3.87–5.11)
RDW: 14.7 % (ref 11.5–15.5)
WBC: 12.8 10*3/uL — ABNORMAL HIGH (ref 4.0–10.5)

## 2017-03-25 LAB — BASIC METABOLIC PANEL
Anion gap: 15 (ref 5–15)
BUN: 7 mg/dL (ref 6–20)
CALCIUM: 8.8 mg/dL — AB (ref 8.9–10.3)
CO2: 40 mmol/L — AB (ref 22–32)
CREATININE: 0.53 mg/dL (ref 0.44–1.00)
Chloride: 81 mmol/L — ABNORMAL LOW (ref 101–111)
GFR calc non Af Amer: >60 mL/min (ref >60)
GLUCOSE: 218 mg/dL — AB (ref 65–99)
Potassium: 3.8 mmol/L (ref 3.5–5.1)
Sodium: 136 mmol/L (ref 135–145)

## 2017-03-25 LAB — MRSA PCR SCREENING: MRSA by PCR: NEGATIVE

## 2017-03-25 MED ORDER — INSULIN GLARGINE 100 UNIT/ML ~~LOC~~ SOLN
10.0000 [IU] | Freq: Every day | SUBCUTANEOUS | Status: DC
  Administered 2017-03-25: 10 [IU] via SUBCUTANEOUS
  Filled 2017-03-25 (×2): qty 0.1

## 2017-03-25 MED ORDER — INSULIN ASPART 100 UNIT/ML ~~LOC~~ SOLN
3.0000 [IU] | Freq: Three times a day (TID) | SUBCUTANEOUS | Status: DC
  Administered 2017-03-25 – 2017-03-26 (×3): 3 [IU] via SUBCUTANEOUS

## 2017-03-25 NOTE — Progress Notes (Signed)
PROGRESS NOTE   Mary [name]  WUJ:[id number]    DOB: 12-Jun-1976    DOA: 03/24/2017  PCP: Jeanine Luz, FNP   I have briefly reviewed patients previous medical records in [facility].  Brief Narrative:  41 year old female with PMH of severe COPD (Dr. Marchelle Gearing), oxygen dependent (2.5-3 L/m in the past but for the last month on 4 L/m), steroid dependent (prednisone 5 MG daily), ongoing tobacco abuse, alcohol use, remote PE and completed Coumadin anticoagulation, diet-controlled DM 2, chronic right-sided/diastolic CHF, OSA on nightly BiPAP, heart/lung surgery at birth, multiple hospitalizations (4 in the last 6 months), recent hospitalization between 02/13/17 - 02/15/17 when she was readmitted for fungemia, ID was consulted and she was discharged home to complete 11 days of Diflucan, presented to ED with worsening dyspnea, weakness and sleepiness. Admitted for COPD exacerbation and acute on chronic hypoxic and hypercapnic respiratory failure. Pulmonology consulted on 4/20.   Assessment & Plan:   Principal Problem:   COPD exacerbation (HCC) Active Problems:   OSA on BiPAP   Chronic right-sided CHF (congestive heart failure) (HCC)   Obesity hypoventilation syndrome (HCC)   Esophageal reflux   Morbid obesity (HCC)   Chronic diastolic heart failure (HCC)   Diabetes mellitus type 2 in obese (HCC)   Acute on chronic respiratory failure with hypoxia and hypercapnia (HCC)   1. Acute COPD exacerbation: Patient is home oxygen and steroid dependent at baseline. Severe COPD which is likely progressing in the context of ongoing tobacco abuse. Tobacco cessation counseled again. Continue IV Solu-Medrol 40 mg every 12 hours. Titrate oxygen to maintain saturations between 89-92 percent. Continue BiPAP at night. Duo nebs every 6 hourly and when necessary albuterol nebulizations. Flutter valve added. Just completed an oral prednisone taper and course of doxycycline on 4/18. Low index of clinical  suspicion for infectious etiology and hence no antibiotics initiated. Recent pneumonia has clinically and radiologically improved/resolved. Consulted pulmonology due to severe COPD, progressively worsening symptoms and hypoxia over the last several weeks and severe hypoxia overnight last night despite BiPAP and nasal cannula oxygen. 2. Acute on chronic hypoxic and hypercapnic respiratory failure: Precipitated by COPD exacerbation. Management as above. 3. Chronic right heart/diastolic CHF: Not significantly volume overloaded. Continue home dose of diuretics and potassium supplements. 4. OSA/OHS on nightly BiPAP: Continue. Rest of management as above. 5. Tobacco abuse: Absolute cessation was counseled again. 6. Uncontrolled type II DM: Not on medications at home. Place on SSI while hospitalized. Uncontrolled. Add Lantus 10 units daily. A1C : 6.3 on 2/27. 7. GERD: PPI 8. Morbid obesity/There is no height or weight on file to calculate BMI.  9. Recently treated fungemia 10. Hypokalemia: Replaced. 11. Thrombocytopenia: Unclear etiology. Stable. 12. ? Sleepiness: unclear etiology. ? Meds, hypercapnia. Currently alert and oriented.   DVT prophylaxis: Lovenox Code Status: Full Family Communication: None at bedside Disposition: DC home when medically improved and stable   Consultants:  Pulmonology   Procedures:  BiPAP at bedtime  Antimicrobials:  None    Subjective: Dyspnea better but breathing not at baseline. As per patient & RN report overnight last night, despite BiPAP and nasal cannula oxygen, was desaturating into the 70s.  ROS: Patient anxious and worried stating that she is scared to go to sleep due to the hypoxia while sleeping and that she may not wake up. Reassured her.  Objective:  Vitals:   03/25/17 0332 03/25/17 0736 03/25/17 0800 03/25/17 0838  BP: 111/74 94/75  132/77  Pulse: 79 93 81 (!) 104  Resp: 19 (!) 22 16 (!) 21  Temp: 98 F (36.7 C)   97.8 F (36.6 C)    TempSrc: Oral   Oral  SpO2: 94% 96% 90% 90%  Weight: 93.5 kg (206 lb 2.1 oz)     Height:        Examination:  General exam: Pleasant young female, moderately built and obese, sitt ing up comfortably in bed Respiratory system: Distant breath sounds and occasional rhonchi but better than on admission. Respiratory effort normal. Cardiovascular system: S1 & S2 heard, RRR. No JVD, murmurs, rubs, gallops or clicks. Trace ankle edema. Tele: Sinus Rhythm  Gastrointestinal system: Abdomen is nondistended, soft and nontender. No organomegaly or masses felt. Normal bowel sounds heard. Central nervous system: Alert and oriented. No focal neurological deficits. Extremities: Symmetric 5 x 5 power. Skin: No rashes, lesions or ulcers Psychiatry: Judgement and insight appear normal. Mood & affect appropriate.     Data Reviewed: I have personally reviewed following labs and imaging studies  CBC:  Recent Labs Lab 03/24/17 1354 03/25/17 0327  WBC 10.9* 12.8*  HGB 13.6 13.5  HCT 45.5 45.2  MCV 106.3* 104.1*  PLT 139* 139*   Basic Metabolic Panel:  Recent Labs Lab 03/24/17 1354 03/25/17 0327  NA 139 136  K 3.4* 3.8  CL 88* 81*  CO2 41* 40*  GLUCOSE 215* 218*  BUN 7 7  CREATININE 0.53 0.53  CALCIUM 8.6* 8.8*   CBG:  Recent Labs Lab 03/24/17 2158 03/25/17 0924  GLUCAP 365* 328*    No results found for this or any previous visit (from the past 240 hour(s)).       Radiology Studies: Dg Chest 2 View  Result Date: 03/24/2017 CLINICAL DATA:  Weakness, cough and shortness of breath. EXAM: CHEST  2 VIEW COMPARISON:  Chest x-ray 03/14/2017 FINDINGS: The heart is mildly enlarged but stable. Prominent left pulmonary hilum due to prominent pulmonary arteries. Some persistent left lower lobe atelectasis or infiltrate. No pleural effusion. The bony thorax is intact. IMPRESSION: Residual streaky left basilar atelectasis or resolving infiltrate. No pleural effusion. Electronically Signed    By: Rudie Meyer M.D.   On: 03/24/2017 15:00        Scheduled Meds: . enoxaparin (LOVENOX) injection  40 mg Subcutaneous Q24H  . gabapentin  600 mg Oral TID  . guaiFENesin  1,200 mg Oral BID  . insulin aspart  0-15 Units Subcutaneous TID WC  . insulin aspart  0-5 Units Subcutaneous QHS  . ipratropium-albuterol  3 mL Nebulization Q6H  . methylPREDNISolone (SOLU-MEDROL) injection  40 mg Intravenous Q12H  . pantoprazole  40 mg Oral Daily  . potassium chloride SA  20 mEq Oral Daily  . pregabalin  50 mg Oral TID  . roflumilast  500 mcg Oral Daily  . sodium chloride flush  3 mL Intravenous Q12H  . torsemide  20 mg Oral Daily   Continuous Infusions:   LOS: 0 days     Rakel Junio, MD, FACP, FHM. [facility] Pager [phone]  If 7PM-7AM, please contact night-coverage [email or web address] Password [email or web address] 03/25/2017, 12:12 PM

## 2017-03-25 NOTE — Care Management Note (Signed)
Case Management Note  Patient Details  Name: Mary [name] MRN: [id number] Date of Birth: 09/20/1976  Subjective/Objective:    Pt admitted with SOB                Action/Plan:   PTA from home - on home oxygen.   Expected Discharge Date:                  Expected Discharge Plan:  Home/Self Care  In-House Referral:     Discharge planning Services  CM Consult  Post Acute Care Choice:    Choice offered to:     DME Arranged:    DME Agency:     HH Arranged:    HH Agency:     Status of Service:     If discussed at [employer] of Stay Meetings, dates discussed:    Additional Comments:  Cherylann Parr, RN 03/25/2017, 9:33 AM

## 2017-03-25 NOTE — Consult Note (Signed)
Name: Mary [name] MRN: [id number] DOB: Aug 27, 1976    ADMISSION DATE:  03/24/2017 CONSULTATION DATE:  03/25/2017  REFERRING MD :  Dr. Waymon Amato  CHIEF COMPLAINT:  COPD exacerbation   HISTORY OF PRESENT ILLNESS:  A 41 year old female admitted 4/19 for acute on chronic COPD exacerbation.  The patient has complex medical history of severe COPD (2.5-4L and predisone  daily dependent), chronic hypoxic and hypercarbic respiratory failure, OSA on BiPAP, Cor Pulmonale, pulmonary embolism (completed course of OAC), DM, diastolic HF, and heart/lung surgery at birth.  She is followed by Dr. Marchelle Gearing.  She states she is compliant with taking home meds, wearing her oxygen except when she smokes, and wearing her BiPAP when she naps or sleeps.  She can not recall her BiPAP settings. She has been unable to go to pulmonary rehab due to lack of transportation.  She states that since she had her palliative care consult several months ago, she started smoking around 1 cigarette a day and drinking alcohol again.    She reports progressive fatigue and exertional dyspnea for the last several months.  Of note, she has had 4 admissions in the last 6 months, most recently admitted in March and treated for fungemia (1/2 BC Malassezia Pachydermatis).  Most recently she was seen in the office on 4/9 and treated for COPD exacerbation.  Treated with a 7 day course of doxycycline and prednisone taper back to her baseline of  daily. The patient denies any fever, changes in sputum color or production, or extremity edema.  She admits she is scared to sleep even though she wears her BiPAP because she is afraid she will not wake up.  Chest xray shows residual streaky left basilar atelectasis/ resolving infiltrate.  Overnight, she destaturated to the 70's while wearing BiPAP.  Today she has been on 5L [place], ranging from 88-94% depending on activity.  PCCM to consult for COPD exacerbation and acute on chronic respiratory  failure.   PAST MEDICAL HISTORY :   has a past medical history of Axillary adenopathy; Chronic right-sided CHF (congestive heart failure) (HCC) (03/23/2012); COPD (chronic obstructive pulmonary disease) (HCC); DVT of upper extremity (deep vein thrombosis) [facility]) (April 2013); Exertional shortness of breath; Hepatic cirrhosis (HCC); History of alcoholism (HCC); History of chronic bronchitis; Irregular menses; Migraine; Moderate to severe pulmonary hypertension [facility]) (April 2013); On home oxygen therapy; OSA (obstructive sleep apnea); Periodontal disease; Pneumonia (~ 1985; 01/2011); Pulmonary embolus [facility]) (April 2013); and Pulmonary hypertension (HCC).  has a past surgical history that includes Cardiac surgery (05/26/1977); Lung surgery (18-Sep[birth year]1977); Appendectomy (05/03/2013); Cardiac catheterization (03/2012); laparoscopic appendectomy (N/A, 05/03/2013); right heart catheterization (N/A, 03/07/2012); and Cardiac catheterization (N/A, 01/08/2016). Prior to Admission medications   Medication Sig Start Date End Date Taking? Authorizing Provider  albuterol (PROVENTIL HFA;VENTOLIN HFA) 108 (90 BASE) MCG/ACT inhaler Inhale 2 puffs into the lungs every 6 (six) hours as needed for wheezing or shortness of breath. 09/06/14  Yes Kalman Shan, MD  BREO ELLIPTA 100-25 MCG/INH AEPB INHALE 1 PUFF INTO THE LUNGS DAILY. 06/03/16  Yes Kalman Shan, MD  gabapentin (NEURONTIN) 300 MG capsule TAKE 2 CAPSULES BY MOUTH 3 TIMES DAILY. 12/14/16  Yes Veryl Speak, FNP  guaiFENesin (MUCINEX) 600 MG 12 hr tablet Take 1 tablet (600 mg total) by mouth 2 (two) times daily. 02/11/17  Yes Vassie Loll, MD  LYRICA 50 MG capsule TAKE 1 CAPSULE BY MOUTH 3 TIMES DAILY 02/07/17  Yes Veryl Speak, FNP  pantoprazole (PROTONIX) 40 MG tablet  Take 1 tablet (40 mg total) by mouth 2 (two) times daily before a meal. Patient taking differently: Take 40 mg by mouth daily.  07/22/16  Yes Jeralyn Bennett, MD  potassium chloride SA (K-DUR,KLOR-CON) 20  MEQ tablet Take 1 tablet (20 mEq total) by mouth daily. 01/29/16  Yes Dolores Patty, MD  roflumilast (DALIRESP) 500 MCG TABS tablet Take 500 mcg by mouth daily.   Yes Historical Provider, MD  SPIRIVA RESPIMAT 1.25 MCG/ACT AERS INHALE 2 PUFFS INTO THE LUNGS 2 (TWO) TIMES DAILY. Patient taking differently: Inhale 2 puffs into the lungs daily.  08/16/16  Yes Kalman Shan, MD  torsemide (DEMADEX) 20 MG tablet Take 1 tablet (20 mg total) by mouth daily. 12/03/16  Yes Kalman Shan, MD  traMADol (ULTRAM) 50 MG tablet TAKE 1 TABLET EVERY 6 HOURS AS NEEDED FOR SEVERE PAIN 03/23/17  Yes Veryl Speak, FNP  predniSONE (DELTASONE) 5 MG tablet Take  20 mg (4 Tab) qam with breakfast 2 days, then take 10 mg (2 tab) qam with breakfast 3 days; then resume 5 mg daily indefinitely) Patient taking differently: Take 5 mg by mouth daily.  02/15/17   Vassie Loll, MD   No Known Allergies  FAMILY HISTORY:  family history includes Alcohol abuse in her father; Heart disease in her father; Hypertension in her father; Other in her brother. SOCIAL HISTORY:  reports that she has been smoking Cigarettes.  She has a 25.00 pack-year smoking history. She has never used smokeless tobacco. She reports that she does not drink alcohol or use drugs.  REVIEW OF SYSTEMS:  POSITIVES IN BOLD Constitutional: Negative for fever, chills, weight loss, malaise/fatigue and diaphoresis.  HENT: Negative for hearing loss, ear pain, nosebleeds, congestion, sore throat, neck pain, tinnitus and ear discharge.   Eyes: Negative for blurred vision, double vision, photophobia, pain, discharge and redness.  Respiratory: Negative for cough, hemoptysis, sputum production, shortness of breath, wheezing and stridor.   Cardiovascular: Negative for chest pain, palpitations, orthopnea, claudication, leg swelling and PND.  Gastrointestinal: Negative for heartburn, nausea, vomiting, abdominal pain, diarrhea, constipation, blood in stool and  melena.  Genitourinary: Negative for dysuria, urgency, frequency, hematuria and flank pain.  Musculoskeletal: Negative for myalgias, back pain, joint pain and falls.  Skin: Negative for itching and rash.  Neurological: Negative for dizziness, tingling, tremors, sensory change, speech change, focal weakness, seizures, loss of consciousness, weakness and headaches.  Endo/Heme/Allergies: Negative for environmental allergies and polydipsia. Does not bruise/bleed easily.  SUBJECTIVE:   VITAL SIGNS: Temp:  [97.8 F (36.6 C)-98.8 F (37.1 C)] 97.8 F (36.6 C) (04/20 0838) Pulse Rate:  [78-105] 104 (04/20 0838) Resp:  [16-23] 21 (04/20 0838) BP: (94-138)/(55-119) 132/77 (04/20 0838) SpO2:  [88 %-99 %] 90 % (04/20 0838) Weight:  [205 lb 11 oz (93.3 kg)-206 lb 2.1 oz (93.5 kg)] 206 lb 2.1 oz (93.5 kg) (04/20 0332)  PHYSICAL EXAMINATION: General:  Adult female sitting on side of bed eating lunch in NAD.   Neuro:  AOx3, MAE, non-focal HEENT:  MM pink/moist, no appreciable JVD, raspy voice Cardiovascular:  RR, no m/r/g, 2+ pulses Lungs:  Regular, even/ non labored, speaking full sentences, no wheezing, good air movement, no increased WOB noted while patient got up to use BSC, but sats did briefly drop to 87% on 5L Abdomen:  Obese, soft, NT/ND, +bs Skin:  Warm/dry, no rashes or lesions Psych: Intermittently crying, depressed mood   Recent Labs Lab 03/24/17 1354 03/25/17 0327  NA 139 136  K 3.4*  3.8  CL 88* 81*  CO2 41* 40*  BUN 7 7  CREATININE 0.53 0.53  GLUCOSE 215* 218*    Recent Labs Lab 03/24/17 1354 03/25/17 0327  HGB 13.6 13.5  HCT 45.5 45.2  WBC 10.9* 12.8*  PLT 139* 139*   Dg Chest 2 View  Result Date: 03/24/2017 CLINICAL DATA:  Weakness, cough and shortness of breath. EXAM: CHEST  2 VIEW COMPARISON:  Chest x-ray 03/14/2017 FINDINGS: The heart is mildly enlarged but stable. Prominent left pulmonary hilum due to prominent pulmonary arteries. Some persistent left lower  lobe atelectasis or infiltrate. No pleural effusion. The bony thorax is intact. IMPRESSION: Residual streaky left basilar atelectasis or resolving infiltrate. No pleural effusion. Electronically Signed   By: Rudie Meyer M.D.   On: 03/24/2017 15:00   SIGNIFICANT EVENTS  4/19  Admit COPD exacerbation/ [facility] respiratory failure  STUDIES:  CXR 4/20 >> residual streaky left basilar atelectasis/ resolving infiltrate   ASSESSMENT / PLAN:  Acute on chronic hypoxic and hypercarbic respiratory failure COPD exacerbation  OSA on BiPAP Cor Pulmonale  Tobacco Abuse  P:  Monitor overnight on BiPAP to assess for desaturations.  Her sats need to be stable on BiPAP w/ 4L while sleeping prior to discharge.   Currently above her baseline at 5L [place]- titrate to goal of 88 - 94% and goal of 2.5 - 4 L Continue with BDs, mucinex, roflumilast, torsemide Can transition to prednisone taper  Continue pulmonary hygiene  Continue sprivia and breo at discharge Will need to follow up in pulmonary office after discharge in 1-2 weeks Will need to clarify home BiPAP settings  Ongoing tobacco cessation conseling   Posey Boyer, AGACNP-BC [facility] Pgr: [phone] or if no answer [phone] 03/25/2017, 1:27 PM

## 2017-03-25 NOTE — Progress Notes (Signed)
Pt. Placed herself on bipap and is tolerating well at this time.

## 2017-03-26 DIAGNOSIS — E662 Morbid (severe) obesity with alveolar hypoventilation: Secondary | ICD-10-CM

## 2017-03-26 DIAGNOSIS — J441 Chronic obstructive pulmonary disease with (acute) exacerbation: Principal | ICD-10-CM

## 2017-03-26 DIAGNOSIS — J9622 Acute and chronic respiratory failure with hypercapnia: Secondary | ICD-10-CM

## 2017-03-26 DIAGNOSIS — J9621 Acute and chronic respiratory failure with hypoxia: Secondary | ICD-10-CM

## 2017-03-26 LAB — HEMOGLOBIN A1C
Hgb A1c MFr Bld: 6.2 % — ABNORMAL HIGH (ref 4.8–5.6)
Mean Plasma Glucose: 131 mg/dL

## 2017-03-26 LAB — GLUCOSE, CAPILLARY
GLUCOSE-CAPILLARY: 316 mg/dL — AB (ref 65–99)
GLUCOSE-CAPILLARY: 361 mg/dL — AB (ref 65–99)
GLUCOSE-CAPILLARY: 235 mg/dL — AB (ref 65–99)
GLUCOSE-CAPILLARY: 381 mg/dL — AB (ref 65–99)

## 2017-03-26 MED ORDER — INSULIN GLARGINE 100 UNIT/ML ~~LOC~~ SOLN
15.0000 [IU] | Freq: Every day | SUBCUTANEOUS | Status: DC
  Administered 2017-03-26: 15 [IU] via SUBCUTANEOUS
  Filled 2017-03-26 (×2): qty 0.15

## 2017-03-26 MED ORDER — INSULIN ASPART 100 UNIT/ML ~~LOC~~ SOLN
5.0000 [IU] | Freq: Three times a day (TID) | SUBCUTANEOUS | Status: DC
  Administered 2017-03-26 – 2017-03-27 (×3): 5 [IU] via SUBCUTANEOUS

## 2017-03-26 MED ORDER — PANTOPRAZOLE SODIUM 40 MG PO TBEC
40.0000 mg | DELAYED_RELEASE_TABLET | Freq: Two times a day (BID) | ORAL | Status: DC
  Administered 2017-03-26 – 2017-03-28 (×4): 40 mg via ORAL
  Filled 2017-03-26 (×4): qty 1

## 2017-03-26 MED ORDER — MOMETASONE FURO-FORMOTEROL FUM 200-5 MCG/ACT IN AERO
2.0000 | INHALATION_SPRAY | Freq: Two times a day (BID) | RESPIRATORY_TRACT | Status: DC
  Administered 2017-03-26 – 2017-03-28 (×5): 2 via RESPIRATORY_TRACT
  Filled 2017-03-26: qty 8.8

## 2017-03-26 NOTE — Consult Note (Deleted)
PULMONARY / CRITICAL CARE MEDICINE   Name: MARVIN MAENZA MRN: [id number] DOB: Mar 15, 1976    ADMISSION DATE:  03/24/2017 CONSULTATION DATE:   03/25/17   REFERRING MD:  [facility]   CHIEF COMPLAINT:  Sob/cough   HISTORY OF PRESENT ILLNESS:    40 yowf MO smoker/ born premature "on vent for first 6 m but never trached" with severe copd complicated by hypoxemia/hypercarbia/ bipap dep also maint on daliresp/spiriva SMI and breo dpi admitted with acute flare      SUBJECTIVE:  Feeling better p rest on new cpap settings, could not tol autoset - settings not recorded last night     INTAKE / OUTPUT: I/O last 3 completed shifts: In: 1360 [P.O.:1360] Out: -   PHYSICAL EXAMINATION: General:  Obese very hoarse wm harsh congested sounding cough   Wt Readings from Last 3 Encounters:  03/26/17 204 lb 6.4 oz (92.7 kg)  03/14/17 203 lb 6.4 oz (92.3 kg)  02/15/17 209 lb 3.5 oz (94.9 kg)    Vital signs reviewed  Pt alert, approp nad sitting on side of bed No jvd Oropharynx clear Neck supple Lungs with a few scattered exp > insp rhonchi bilaterally RRR no s3 or or sign murmur Abd obese with excursion >> Extr wam with no edema or clubbing noted Neuro  No motor deficits   LABS:  BMET  Recent Labs Lab 03/24/17 1354 03/25/17 0327  NA 139 136  K 3.4* 3.8  CL 88* 81*  CO2 41* 40*  BUN 7 7  CREATININE 0.53 0.53  GLUCOSE 215* 218*    Electrolytes  Recent Labs Lab 03/24/17 1354 03/25/17 0327  CALCIUM 8.6* 8.8*    CBC  Recent Labs Lab 03/24/17 1354 03/25/17 0327  WBC 10.9* 12.8*  HGB 13.6 13.5  HCT 45.5 45.2  PLT 139* 139*    Coag's No results for input(s): APTT, INR in the last 168 hours.  Sepsis Markers No results for input(s): LATICACIDVEN, PROCALCITON, O2SATVEN in the last 168 hours.  ABG No results for input(s): PHART, PCO2ART, PO2ART in the last 168 hours.  Liver Enzymes No results for input(s): AST, ALT, ALKPHOS, BILITOT, ALBUMIN in the last 168  hours.  Cardiac Enzymes No results for input(s): TROPONINI, PROBNP in the last 168 hours.  Glucose  Recent Labs Lab 03/24/17 2158 03/25/17 0924 03/25/17 1237 03/25/17 1710 03/25/17 2130 03/26/17 0816  GLUCAP 365* 328* 289* 220* 382* 316*    Imaging No results found.      ASSESSMENT / PLAN:    1)  Severe COPD/ acute exacerbation - pt preemie and active smoking both assoc with copd   DDX of  difficult airways management almost all start with A and  include Adherence, Ace Inhibitors, Acid Reflux, Active Sinus Disease, Alpha 1 Antitripsin deficiency, Anxiety masquerading as Airways dz,  ABPA,  Allergy(esp in young), Aspiration (esp in elderly), Adverse effects of meds,  Active smokers, A bunch of PE's (a small clot burden can't cause this syndrome unless there is already severe underlying pulm or vascular dz with poor reserve) plus two Bs  = Bronchiectasis and Beta blocker use..and one C= CHF    Adherence is always the initial "prime suspect" and is a multilayered concern that requires a "trust but verify" approach in every patient - starting with knowing how to use medications, especially inhalers, correctly, keeping up with refills and understanding the fundamental difference between maintenance and prns vs those medications only taken for a very short course and then stopped and  not refilled.  - demonstrated optimal hfa - would do a trust but verify approach here: return with all meds in hand using a trust but verify approach to confirm accurate Medication  Reconciliation The principal here is that until we are certain that the  patients are doing what we've asked, it makes no sense to ask them to do more.   Active smoking greatest concern> discussed   ? Acid (or non-acid) GERD > always difficult to exclude as up to 75% of pts in some series report no assoc GI/ Heartburn symptoms> rec max (24h)  acid suppression  .   ? Adverse effects of DPI > change to symb 160 2bid  (dulera  200) and d/c Breo/ continue spiriva respimat at d/c  Alpha One AT :   Pt MM by screening 2013   ? CHF > excluded by BNP <<104 this admit     2) Acute on chronic RF hypoxemic and hypercarbic  - multifactorial just as much OHS as COPD - change Bipap settings at home to what she's on here but can't make this change until 3/23 per pt so keep until we're sure it's done and we've optimized copd  For outpt rx or very likely she'll bounce back      Sandrea Hughs, MD Pulmonary and Critical Care Medicine [facility] Cell [phone] After 5:30 PM or weekends, use Beeper [phone]

## 2017-03-26 NOTE — Progress Notes (Signed)
PROGRESS NOTE   Mary [name]  ZOX:[id number]    DOB: [birth year]    DOA: 03/24/2017  PCP: Jeanine Luz, FNP   I have briefly reviewed patients previous medical records in [facility].  Brief Narrative:  41 year old female with PMH of severe COPD (Dr. Marchelle Gearing), oxygen dependent (2.5-3 L/m in the past but for the last month on 4 L/m), steroid dependent (prednisone 5 MG daily), ongoing tobacco abuse, alcohol use, remote PE and completed Coumadin anticoagulation, diet-controlled DM 2, chronic right-sided/diastolic CHF, OSA on nightly BiPAP, heart/lung surgery at birth, multiple hospitalizations (4 in the last 6 months), recent hospitalization between 02/13/17 - 02/15/17 when she was readmitted for fungemia, ID was consulted and she was discharged home to complete 11 days of Diflucan, presented to ED with worsening dyspnea, weakness and sleepiness. Admitted for COPD exacerbation and acute on chronic hypoxic and hypercapnic respiratory failure. Pulmonology consulted on 4/20.   Assessment & Plan:   Principal Problem:   COPD exacerbation (HCC) Active Problems:   OSA on BiPAP   Chronic right-sided CHF (congestive heart failure) (HCC)   Obesity hypoventilation syndrome (HCC)   Esophageal reflux   Morbid obesity (HCC)   Chronic diastolic heart failure (HCC)   Diabetes mellitus type 2 in obese (HCC)   Acute on chronic respiratory failure with hypoxia and hypercapnia (HCC)   1. Acute COPD exacerbation: Patient is home oxygen and steroid dependent at baseline. Severe COPD which is likely progressing in the context of ongoing tobacco abuse. Tobacco cessation counseled again. Continue IV Solu-Medrol 40 mg every 12 hours. Titrate oxygen to maintain saturations between 89-92 percent. Continue BiPAP at night. Duo nebs every 6 hourly and when necessary albuterol nebulizations. Flutter valve added. Just completed an oral prednisone taper and course of doxycycline on 4/18. Low index of clinical  suspicion for infectious etiology and hence no antibiotics initiated. Recent pneumonia has clinically and radiologically improved/resolved. Consulted pulmonology due to severe COPD, progressively worsening symptoms and hypoxia over the last several weeks and severe hypoxia overnight last night despite BiPAP and nasal cannula oxygen. As per pulmonology, etiology of worsening hypoxia may have been COPD exacerbation and her hypoxia may have been lagging behind. Auto BiPAP settings were changed in the hospital but she will need follow-up in the pulmonary clinic to review settings and may need repeat titration studies. Slept well overnight with not much hypoxic episodes. As per pulmonary follow-up and discussion with Dr. Sherene Sires, would continue management and monitoring in the hospital until Monday 4/24 to ensure home BiPAP settings are changed prior to discharge otherwise high likelihood of readmission. 2. Acute on chronic hypoxic and hypercapnic respiratory failure: Precipitated by COPD exacerbation. Management as above. 3. Chronic right heart/diastolic CHF: Not significantly volume overloaded. Continue home dose of diuretics and potassium supplements. 4. OSA/OHS on nightly BiPAP: Continue. Rest of management as above. 5. Tobacco abuse: Absolute cessation was counseled again. 6. Uncontrolled type II DM: Not on medications at home. Place on SSI while hospitalized. Uncontrolled. Increased Lantus to 15 units daily.  Add mealtime NovoLog 4 units 3 times a day. A1C : 6.3 on 2/27. Today patient stated that she was actually on 2 different types of insulins at home which did not show up on her home med rec >requested RN to have pharmacy update the home med rec.  7. GERD: PPI 8. Morbid obesity/There is no height or weight on file to calculate BMI.  9. Recently treated fungemia 10. Hypokalemia: Replaced. 11. Thrombocytopenia: Unclear etiology. Stable. 12. ?  Sleepiness: unclear etiology. Resolved.   DVT prophylaxis:  Lovenox Code Status: Full Family Communication: None at bedside Disposition: DC home when medically improved, stable and cleared by CCM   Consultants:  Pulmonology   Procedures:  BiPAP at bedtime  Antimicrobials:  None    Subjective: States that she slept well last night and as per RN, no significant episodes of hypoxia while on BiPAP. This morning denies dyspnea, cough or chest pain.   ROS: Patient would like to ambulate to determine how she does as far as oxygenation.   Objective:  Vitals:   03/26/17 0800 03/26/17 0817 03/26/17 1200 03/26/17 1320  BP:  (!) 132/102 (!) 127/106 123/82  Pulse: 80 71 92 78  Resp: (!) 23 17 (!) 21 15  Temp:  98.2 F (36.8 C)  97.9 F (36.6 C)  TempSrc:  Oral  Oral  SpO2: 90% 92% 96% 93%  Weight:      Height:        Examination:  General exam: Pleasant young female, moderately built and obese, sitt ing up comfortably in bed Respiratory system: slightly harsh breath sounds bilaterally but no obvious wheezing, rhonchi or crackles. Respiratory effort normal. Cardiovascular system: S1 & S2 heard, RRR. No JVD, murmurs, rubs, gallops or clicks. Trace ankle edema. Tele: Sinus Rhythm  Gastrointestinal system: Abdomen is nondistended, soft and nontender. No organomegaly or masses felt. Normal bowel sounds heard. Central nervous system: Alert and oriented. No focal neurological deficits. Extremities: Symmetric 5 x 5 power. Skin: No rashes, lesions or ulcers Psychiatry: Judgement and insight appear normal. Mood & affect appropriate.     Data Reviewed: I have personally reviewed following labs and imaging studies  CBC:  Recent Labs Lab 03/24/17 1354 03/25/17 0327  WBC 10.9* 12.8*  HGB 13.6 13.5  HCT 45.5 45.2  MCV 106.3* 104.1*  PLT 139* 139*   Basic Metabolic Panel:  Recent Labs Lab 03/24/17 1354 03/25/17 0327  NA 139 136  K 3.4* 3.8  CL 88* 81*  CO2 41* 40*  GLUCOSE 215* 218*  BUN 7 7  CREATININE 0.53 0.53  CALCIUM 8.6*  8.8*   CBG:  Recent Labs Lab 03/25/17 1237 03/25/17 1710 03/25/17 2130 03/26/17 0816 03/26/17 1200  GLUCAP 289* 220* 382* 316* 361*    Recent Results (from the past 240 hour(s))  Blood culture (routine x 2)     Status: None (Preliminary result)   Collection Time: 03/24/17  3:08 PM  Result Value Ref Range Status   Specimen Description BLOOD LEFT ANTECUBITAL  Final   Special Requests   Final    BOTTLES DRAWN AEROBIC AND ANAEROBIC Blood Culture results may not be optimal due to an excessive volume of blood received in culture bottles   Culture NO GROWTH 2 DAYS  Final   Report Status PENDING  Incomplete  Blood culture (routine x 2)     Status: None (Preliminary result)   Collection Time: 03/24/17  4:17 PM  Result Value Ref Range Status   Specimen Description BLOOD RIGHT HAND  Final   Special Requests   Final    BOTTLES DRAWN AEROBIC AND ANAEROBIC Blood Culture adequate volume   Culture NO GROWTH 2 DAYS  Final   Report Status PENDING  Incomplete  MRSA PCR Screening     Status: None   Collection Time: 03/25/17  1:51 PM  Result Value Ref Range Status   MRSA by PCR NEGATIVE NEGATIVE Final    Comment:        The  GeneXpert MRSA Assay (FDA approved for NASAL specimens only), is one component of a comprehensive MRSA colonization surveillance program. It is not intended to diagnose MRSA infection nor to guide or monitor treatment for MRSA infections.          Radiology Studies: Dg Chest 2 View  Result Date: 03/24/2017 CLINICAL DATA:  Weakness, cough and shortness of breath. EXAM: CHEST  2 VIEW COMPARISON:  Chest x-ray 03/14/2017 FINDINGS: The heart is mildly enlarged but stable. Prominent left pulmonary hilum due to prominent pulmonary arteries. Some persistent left lower lobe atelectasis or infiltrate. No pleural effusion. The bony thorax is intact. IMPRESSION: Residual streaky left basilar atelectasis or resolving infiltrate. No pleural effusion. Electronically Signed    By: Rudie Meyer M.D.   On: 03/24/2017 15:00        Scheduled Meds: . enoxaparin (LOVENOX) injection  40 mg Subcutaneous Q24H  . gabapentin  600 mg Oral TID  . guaiFENesin  1,200 mg Oral BID  . insulin aspart  0-15 Units Subcutaneous TID WC  . insulin aspart  0-5 Units Subcutaneous QHS  . insulin aspart  3 Units Subcutaneous TID WC  . insulin glargine  15 Units Subcutaneous Daily  . ipratropium-albuterol  3 mL Nebulization Q6H  . methylPREDNISolone (SOLU-MEDROL) injection  40 mg Intravenous Q12H  . mometasone-formoterol  2 puff Inhalation BID  . pantoprazole  40 mg Oral BID AC  . potassium chloride SA  20 mEq Oral Daily  . pregabalin  50 mg Oral TID  . roflumilast  500 mcg Oral Daily  . sodium chloride flush  3 mL Intravenous Q12H  . torsemide  20 mg Oral Daily   Continuous Infusions:   LOS: 1 day     Delitha Elms, MD, FACP, FHM. [facility] Pager [phone]  If 7PM-7AM, please contact night-coverage [email or web address] Password [email or web address] 03/26/2017, 2:18 PM

## 2017-03-26 NOTE — Progress Notes (Signed)
BIPAP is set up and at patient's bedside. Patient stated she would place herself on BIPAP when she goes to bed. RT will monitor as needed.

## 2017-03-26 NOTE — Progress Notes (Signed)
PULMONARY / CRITICAL CARE MEDICINE   Name: Mary [name] MRN: [id number] DOB: [birth year]    ADMISSION DATE:  03/24/2017 CONSULTATION DATE:   03/25/17   REFERRING MD:  [facility]   CHIEF COMPLAINT:  Sob/cough   HISTORY OF PRESENT ILLNESS:    40 yowf MO smoker/ born premature "on vent for first 6 m but never trached" with severe copd complicated by hypoxemia/hypercarbia/ bipap dep also maint on daliresp/spiriva SMI and breo dpi admitted with acute flare      SUBJECTIVE:  Feeling better p rest on new cpap settings, could not tol autoset - settings not recorded last night     INTAKE / OUTPUT: I/O last 3 completed shifts: In: 1360 [P.O.:1360] Out: -   PHYSICAL EXAMINATION: General:  Obese very hoarse wm harsh congested sounding cough   Wt Readings from Last 3 Encounters:  03/26/17 204 lb 6.4 oz (92.7 kg)  03/14/17 203 lb 6.4 oz (92.3 kg)  02/15/17 209 lb 3.5 oz (94.9 kg)    Vital signs reviewed  Pt alert, approp nad sitting on side of bed No jvd Oropharynx clear Neck supple Lungs with a few scattered exp > insp rhonchi bilaterally RRR no s3 or or sign murmur Abd obese with excursion >> Extr wam with no edema or clubbing noted Neuro  No motor deficits   LABS:  BMET  Recent Labs Lab 03/24/17 1354 03/25/17 0327  NA 139 136  K 3.4* 3.8  CL 88* 81*  CO2 41* 40*  BUN 7 7  CREATININE 0.53 0.53  GLUCOSE 215* 218*    Electrolytes  Recent Labs Lab 03/24/17 1354 03/25/17 0327  CALCIUM 8.6* 8.8*    CBC  Recent Labs Lab 03/24/17 1354 03/25/17 0327  WBC 10.9* 12.8*  HGB 13.6 13.5  HCT 45.5 45.2  PLT 139* 139*    Coag's No results for input(s): APTT, INR in the last 168 hours.  Sepsis Markers No results for input(s): LATICACIDVEN, PROCALCITON, O2SATVEN in the last 168 hours.  ABG No results for input(s): PHART, PCO2ART, PO2ART in the last 168 hours.  Liver Enzymes No results for input(s): AST, ALT, ALKPHOS, BILITOT, ALBUMIN in the last 168  hours.  Cardiac Enzymes No results for input(s): TROPONINI, PROBNP in the last 168 hours.  Glucose  Recent Labs Lab 03/25/17 0924 03/25/17 1237 03/25/17 1710 03/25/17 2130 03/26/17 0816 03/26/17 1200  GLUCAP 328* 289* 220* 382* 316* 361*    Imaging No results found.      ASSESSMENT / PLAN:    1)  Severe COPD/ acute exacerbation - pt preemie and active smoking both assoc with copd   DDX of  difficult airways management almost all start with A and  include Adherence, Ace Inhibitors, Acid Reflux, Active Sinus Disease, Alpha 1 Antitripsin deficiency, Anxiety masquerading as Airways dz,  ABPA,  Allergy(esp in young), Aspiration (esp in elderly), Adverse effects of meds,  Active smokers, A bunch of PE's (a small clot burden can't cause this syndrome unless there is already severe underlying pulm or vascular dz with poor reserve) plus two Bs  = Bronchiectasis and Beta blocker use..and one C= CHF    Adherence is always the initial "prime suspect" and is a multilayered concern that requires a "trust but verify" approach in every patient - starting with knowing how to use medications, especially inhalers, correctly, keeping up with refills and understanding the fundamental difference between maintenance and prns vs those medications only taken for a very short course and then stopped and  not refilled.  - demonstrated optimal hfa - would do a trust but verify approach here: return with all meds in hand using a trust but verify approach to confirm accurate Medication  Reconciliation The principal here is that until we are certain that the  patients are doing what we've asked, it makes no sense to ask them to do more.   Active smoking greatest concern> discussed   ? Acid (or non-acid) GERD > always difficult to exclude as up to 75% of pts in some series report no assoc GI/ Heartburn symptoms> rec max (24h)  acid suppression  .   ? Adverse effects of DPI > change to symb 160 2bid  (dulera  200) and d/c Breo/ continue spiriva respimat at d/c  [facility] AT :   Pt MM by screening 2013   ? CHF > excluded by BNP <<104 this admit     2) Acute on chronic RF hypoxemic and hypercarbic  - multifactorial just as much OHS as COPD - change Bipap settings at home to what she's on here but can't make this change until 3/23 per pt so keep until we're sure it's done and we've optimized copd  For outpt rx or very likely she'll bounce back      Sandrea Hughs, MD Pulmonary and Critical Care Medicine [facility] Cell [phone] After 5:30 PM or weekends, use Beeper [phone]

## 2017-03-27 DIAGNOSIS — E118 Type 2 diabetes mellitus with unspecified complications: Secondary | ICD-10-CM

## 2017-03-27 DIAGNOSIS — E1165 Type 2 diabetes mellitus with hyperglycemia: Secondary | ICD-10-CM

## 2017-03-27 LAB — GLUCOSE, CAPILLARY
GLUCOSE-CAPILLARY: 406 mg/dL — AB (ref 65–99)
GLUCOSE-CAPILLARY: 200 mg/dL — AB (ref 65–99)
GLUCOSE-CAPILLARY: 248 mg/dL — AB (ref 65–99)
Glucose-Capillary: 388 mg/dL — ABNORMAL HIGH (ref 65–99)
Glucose-Capillary: 484 mg/dL — ABNORMAL HIGH (ref 65–99)

## 2017-03-27 MED ORDER — INSULIN ASPART 100 UNIT/ML ~~LOC~~ SOLN
8.0000 [IU] | Freq: Three times a day (TID) | SUBCUTANEOUS | Status: DC
  Administered 2017-03-27 – 2017-03-28 (×3): 8 [IU] via SUBCUTANEOUS

## 2017-03-27 MED ORDER — PREDNISONE 20 MG PO TABS
40.0000 mg | ORAL_TABLET | Freq: Every day | ORAL | Status: DC
  Administered 2017-03-28: 40 mg via ORAL
  Filled 2017-03-27: qty 2

## 2017-03-27 MED ORDER — INSULIN GLARGINE 100 UNIT/ML ~~LOC~~ SOLN
25.0000 [IU] | Freq: Every day | SUBCUTANEOUS | Status: DC
  Administered 2017-03-27 – 2017-03-28 (×2): 25 [IU] via SUBCUTANEOUS
  Filled 2017-03-27 (×2): qty 0.25

## 2017-03-27 NOTE — Progress Notes (Signed)
BIPAP is set up at patient's bedside. Patient stated she would place on herself when she is ready for bed.

## 2017-03-27 NOTE — Progress Notes (Signed)
PULMONARY / CRITICAL CARE MEDICINE   Name: Mary [name] PATIENT MRN: [id number] DOB: 04/27/1976    ADMISSION DATE:  03/24/2017 CONSULTATION DATE:   03/25/17   REFERRING MD:  [facility]   CHIEF COMPLAINT:  Sob/cough   HISTORY OF PRESENT ILLNESS:    67 yowf MO smoker/ born premature "on vent for first 6 m but never trached" with severe copd complicated by hypoxemia/hypercarbia/ bipap dep also maint on daliresp/spiriva SMI and breo dpi admitted with acute flare      SUBJECTIVE:  Feeling better p rest on new cpap settings- not recorded as to specific in RT notes        Intake/Output Summary (Last 24 hours) at 03/27/17 1305 Last data filed at 03/27/17 1220  Gross per 24 hour  Intake             1640 ml  Output             5550 ml  Net            -3910 ml    PHYSICAL EXAMINATION: General:  Obese very hoarse wm harsh less congested sounding cough   Wt Readings from Last 3 Encounters:  03/27/17 202 lb 6.1 oz (91.8 kg)  03/14/17 203 lb 6.4 oz (92.3 kg)  02/15/17 209 lb 3.5 oz (94.9 kg)    Vital signs reviewed  Pt alert, approp nad sitting on side of bed No jvd Oropharynx clear Neck supple Lungs with a few scattered exp > insp scattered rhonchi bilaterally RRR no s3 or or sign murmur Abd obese with excursion limited in supine position  Extr wam with no edema or clubbing noted Neuro  No motor deficits   LABS:  BMET  Recent Labs Lab 03/24/17 1354 03/25/17 0327  NA 139 136  K 3.4* 3.8  CL 88* 81*  CO2 41* 40*  BUN 7 7  CREATININE 0.53 0.53  GLUCOSE 215* 218*    Electrolytes  Recent Labs Lab 03/24/17 1354 03/25/17 0327  CALCIUM 8.6* 8.8*    CBC  Recent Labs Lab 03/24/17 1354 03/25/17 0327  WBC 10.9* 12.8*  HGB 13.6 13.5  HCT 45.5 45.2  PLT 139* 139*    Coag's No results for input(s): APTT, INR in the last 168 hours.  Sepsis Markers No results for input(s): LATICACIDVEN, PROCALCITON, O2SATVEN in the last 168 hours.  ABG No results for input(s):  PHART, PCO2ART, PO2ART in the last 168 hours.  Liver Enzymes No results for input(s): AST, ALT, ALKPHOS, BILITOT, ALBUMIN in the last 168 hours.  Cardiac Enzymes No results for input(s): TROPONINI, PROBNP in the last 168 hours.  Glucose  Recent Labs Lab 03/26/17 1200 03/26/17 1731 03/26/17 2143 03/27/17 0733 03/27/17 1221 03/27/17 1258  GLUCAP 361* 235* 381* 388* 484* 406*    Imaging No results found.      ASSESSMENT / PLAN:    1)  Severe COPD/ acute exacerbation - pt preemie and active smoker both assoc with difficult to control copd   DDX of  difficult airways management almost all start with A and  include Adherence, Ace Inhibitors, Acid Reflux, Active Sinus Disease, Alpha 1 Antitripsin deficiency, Anxiety masquerading as Airways dz,  ABPA,  Allergy(esp in young), Aspiration (esp in elderly), Adverse effects of meds,  Active smokers, A bunch of PE's (a small clot burden can't cause this syndrome unless there is already severe underlying pulm or vascular dz with poor reserve) plus two Bs  = Bronchiectasis and Beta blocker use..and one C=  CHF    Adherence is always the initial "prime suspect" and is a multilayered concern that requires a "trust but verify" approach in every patient - starting with knowing how to use medications, especially inhalers, correctly, keeping up with refills and understanding the fundamental difference between maintenance and prns vs those medications only taken for a very short course and then stopped and not refilled.  - demonstrated optimal hfa> rx dulera 200 2bid  - would do a trust but verify approach here: return with all meds in hand using a trust but verify approach to confirm accurate Medication  Reconciliation The principal here is that until we are certain that the  patients are doing what we've asked, it makes no sense to ask them to do more.   Active smoking greatest concern> discussed   ? Acid (or non-acid) GERD > always difficult to  exclude as up to 75% of pts in some series report no assoc GI/ Heartburn symptoms> rec max (24h)  acid suppression  .   ? Adverse effects of DPI > change to symb 160 2bid  (dulera 200) and d/c Breo/ continue spiriva respimat at d/c  [facility] AT :   Pt MM by screening 2013   ? CHF > excluded by BNP <<104 this admit     2) Acute on chronic RF hypoxemic and hypercarbic  - multifactorial just as much OHS as COPD - will need to change Bipap settings at home to what she's on here but can't make this change until 3/23 per pt so keep until we're sure it's done and we've optimized copd  For outpt rx or very likely she'll bounce back      Sandrea Hughs, MD Pulmonary and Critical Care Medicine [facility] Cell [phone] After 5:30 PM or weekends, use Beeper [phone]

## 2017-03-27 NOTE — Progress Notes (Signed)
PROGRESS NOTE   MESA JANUS  JYN:[id number]    DOB: 02/20/1976    DOA: 03/24/2017  PCP: Jeanine Luz, FNP   I have briefly reviewed patients previous medical records in [facility].  Brief Narrative:  41 year old female with PMH of severe COPD (Dr. Marchelle Gearing), oxygen dependent (2.5-3 L/m in the past but for the last month on 4 L/m), steroid dependent (prednisone 5 MG daily), ongoing tobacco abuse, alcohol use, remote PE and completed Coumadin anticoagulation, diet-controlled DM 2, chronic right-sided/diastolic CHF, OSA on nightly BiPAP, heart/lung surgery at birth, multiple hospitalizations (4 in the last 6 months), recent hospitalization between 02/13/17 - 02/15/17 when she was readmitted for fungemia, ID was consulted and she was discharged home to complete 11 days of Diflucan, presented to ED with worsening dyspnea, weakness and sleepiness. Admitted for COPD exacerbation and acute on chronic hypoxic and hypercapnic respiratory failure. Pulmonology consulted on 4/20.Clinically improved. As per pulmonology recommendations, home health services to adjust home BiPAP to new settings on 4/23 prior to discharging patient home.   Assessment & Plan:   Principal Problem:   COPD exacerbation (HCC) Active Problems:   OSA on BiPAP   Chronic right-sided CHF (congestive heart failure) (HCC)   Obesity hypoventilation syndrome (HCC)   Esophageal reflux   Morbid obesity (HCC)   Chronic diastolic heart failure (HCC)   Diabetes mellitus type 2 in obese (HCC)   Acute on chronic respiratory failure with hypoxia and hypercapnia (HCC)   1. Acute COPD exacerbation: Patient is home oxygen and steroid dependent at baseline. Severe COPD which is likely progressing in the context of ongoing tobacco abuse. Tobacco cessation counseled again. Continue IV Solu-Medrol 40 mg every 12 hours. Titrate oxygen to maintain saturations between 89-92 percent. Continue BiPAP at night. Duo nebs every 6 hourly and when  necessary albuterol nebulizations. Flutter valve added. Just completed an oral prednisone taper and course of doxycycline on 4/18. Low index of clinical suspicion for infectious etiology and hence no antibiotics initiated. Recent pneumonia has clinically and radiologically improved/resolved. Consulted pulmonology due to severe COPD, progressively worsening symptoms and hypoxia over the last several weeks and severe hypoxia overnight last night despite BiPAP and nasal cannula oxygen. As per pulmonology, etiology of worsening hypoxia may have been COPD exacerbation and her hypoxia may have been lagging behind. Auto BiPAP settings were changed in the hospital but she will need follow-up in the pulmonary clinic to review settings and may need repeat titration studies. Slept well overnight for last 2 nights with not much hypoxic episodes. As per pulmonary follow-up and discussion with Dr. Sherene Sires, would continue management and monitoring in the hospital until Monday 4/23 to ensure home BiPAP settings are changed by [facility] prior to discharge otherwise high likelihood of readmission. Currently down to oxygen 2.5 L/m. 2. Acute on chronic hypoxic and hypercapnic respiratory failure: Precipitated by COPD exacerbation. Management as above. 3. Chronic right heart/diastolic CHF: Not significantly volume overloaded. Continue home dose of diuretics and potassium supplements. 4. OSA/OHS on nightly BiPAP: Continue. Rest of management as above. 5. Tobacco abuse: Absolute cessation was counseled again. 6. Uncontrolled type II DM: Patient did take Lantus 25 units daily and NovoLog 8 units 3 times a day, both as needed while on steroids. Initiated on Lantus and SSI in the hospital. Increase Lantus to 25 units daily, continue mealtime NovoLog and SSI. Monitor. Should improve upon transitioning to oral steroids and tapering. 7. GERD: PPI 8. Morbid obesity/There is no height or weight on file to  calculate BMI.  9. Recently treated  fungemia 10. Hypokalemia: Replaced. 11. Thrombocytopenia: Unclear etiology. Stable. 12. ? Sleepiness: unclear etiology. Resolved.   DVT prophylaxis: Lovenox Code Status: Full Family Communication: None at bedside Disposition: DC home when medically improved, stable and cleared by CCM, likely 4/23.   Consultants:  Pulmonology   Procedures:  BiPAP at bedtime  Antimicrobials:  None    Subjective: Slept well last night. States that the Neurontin and Lyrica dose that she takes at home are different than what she is getting here (requested RN to have pharmacy update her home med rec).   ROS: Ambulated yesterday and felt slightly tired.  Objective:  Vitals:   03/27/17 0525 03/27/17 0734 03/27/17 0747 03/27/17 0800  BP:  125/86 125/86   Pulse: 77 77 74 90  Resp: (!) 23  Temp:  97.8 F (36.6 C)    TempSrc:  Oral    SpO2: 95% 95% 97% 92%  Weight:      Height:        Examination:  General exam: Pleasant young female, moderately built and obese, sitt ing up comfortably in bed. Chronic hoarseness. Respiratory system: slightly harsh breath sounds bilaterally but no obvious wheezing, rhonchi or crackles. Respiratory effort normal. Cardiovascular system: S1 & S2 heard, RRR. No JVD, murmurs, rubs, gallops or clicks. Trace ankle edema. Tele: Sinus Rhythm  Gastrointestinal system: Abdomen is nondistended, soft and nontender. No organomegaly or masses felt. Normal bowel sounds heard. Central nervous system: Alert and oriented. No focal neurological deficits. Extremities: Symmetric 5 x 5 power. Skin: No rashes, lesions or ulcers Psychiatry: Judgement and insight appear normal. Mood & affect appropriate.     Data Reviewed: I have personally reviewed following labs and imaging studies  CBC:  Recent Labs Lab 03/24/17 1354 03/25/17 0327  WBC 10.9* 12.8*  HGB 13.6 13.5  HCT 45.5 45.2  MCV 106.3* 104.1*  PLT 139* 139*   Basic Metabolic Panel:  Recent Labs Lab  03/24/17 1354 03/25/17 0327  NA 139 136  K 3.4* 3.8  CL 88* 81*  CO2 41* 40*  GLUCOSE 215* 218*  BUN 7 7  CREATININE 0.53 0.53  CALCIUM 8.6* 8.8*   CBG:  Recent Labs Lab 03/26/17 0816 03/26/17 1200 03/26/17 1731 03/26/17 2143 03/27/17 0733  GLUCAP 316* 361* 235* 381* 388*    Recent Results (from the past 240 hour(s))  Blood culture (routine x 2)     Status: None (Preliminary result)   Collection Time: 03/24/17  3:08 PM  Result Value Ref Range Status   Specimen Description BLOOD LEFT ANTECUBITAL  Final   Special Requests   Final    BOTTLES DRAWN AEROBIC AND ANAEROBIC Blood Culture results may not be optimal due to an excessive volume of blood received in culture bottles   Culture NO GROWTH 2 DAYS  Final   Report Status PENDING  Incomplete  Blood culture (routine x 2)     Status: None (Preliminary result)   Collection Time: 03/24/17  4:17 PM  Result Value Ref Range Status   Specimen Description BLOOD RIGHT HAND  Final   Special Requests   Final    BOTTLES DRAWN AEROBIC AND ANAEROBIC Blood Culture adequate volume   Culture NO GROWTH 2 DAYS  Final   Report Status PENDING  Incomplete  MRSA PCR Screening     Status: None   Collection Time: 03/25/17  1:51 PM  Result Value Ref Range Status   MRSA by PCR NEGATIVE NEGATIVE  Final    Comment:        The GeneXpert MRSA Assay (FDA approved for NASAL specimens only), is one component of a comprehensive MRSA colonization surveillance program. It is not intended to diagnose MRSA infection nor to guide or monitor treatment for MRSA infections.          Radiology Studies: No results found.      Scheduled Meds: . enoxaparin (LOVENOX) injection  40 mg Subcutaneous Q24H  . gabapentin  600 mg Oral TID  . guaiFENesin  1,200 mg Oral BID  . insulin aspart  0-15 Units Subcutaneous TID WC  . insulin aspart  0-5 Units Subcutaneous QHS  . insulin aspart  5 Units Subcutaneous TID WC  . insulin glargine  25 Units  Subcutaneous Daily  . ipratropium-albuterol  3 mL Nebulization Q6H  . methylPREDNISolone (SOLU-MEDROL) injection  40 mg Intravenous Q12H  . mometasone-formoterol  2 puff Inhalation BID  . pantoprazole  40 mg Oral BID AC  . potassium chloride SA  20 mEq Oral Daily  . pregabalin  50 mg Oral TID  . roflumilast  500 mcg Oral Daily  . sodium chloride flush  3 mL Intravenous Q12H  . torsemide  20 mg Oral Daily   Continuous Infusions:   LOS: 2 days     Salil Raineri, MD, FACP, FHM. [facility] Pager [phone]  If 7PM-7AM, please contact night-coverage [email or web address] Password [facility] 03/27/2017, 11:52 AM

## 2017-03-28 LAB — GLUCOSE, CAPILLARY
Glucose-Capillary: 300 mg/dL — ABNORMAL HIGH (ref 65–99)
Glucose-Capillary: 196 mg/dL — ABNORMAL HIGH (ref 65–99)

## 2017-03-28 MED ORDER — INSULIN GLARGINE 100 UNIT/ML ~~LOC~~ SOLN: 25.0000 [IU] | Freq: Every day | SUBCUTANEOUS | Status: DC

## 2017-03-28 MED ORDER — IPRATROPIUM-ALBUTEROL 0.5-2.5 (3) MG/3ML IN SOLN
3.0000 mL | Freq: Three times a day (TID) | RESPIRATORY_TRACT | Status: DC
  Administered 2017-03-28: 3 mL via RESPIRATORY_TRACT
  Filled 2017-03-28 (×2): qty 3

## 2017-03-28 MED ORDER — PREDNISONE 10 MG PO TABS: ORAL_TABLET | ORAL | 0 refills | Status: DC

## 2017-03-28 MED ORDER — INSULIN LISPRO 100 UNIT/ML ~~LOC~~ SOLN: 0.0000 [IU] | Freq: Three times a day (TID) | SUBCUTANEOUS | Status: DC

## 2017-03-28 MED ORDER — BUDESONIDE-FORMOTEROL FUMARATE 160-4.5 MCG/ACT IN AERO: 2.0000 | INHALATION_SPRAY | Freq: Two times a day (BID) | RESPIRATORY_TRACT | 12 refills | Status: DC

## 2017-03-28 MED ORDER — PREDNISONE 5 MG PO TABS: ORAL_TABLET | ORAL | Status: DC

## 2017-03-28 NOTE — Progress Notes (Signed)
Spoke to resp care division of [facility] [phone] ext [phone]).  Home bipap settings confirmed 12/5.

## 2017-03-28 NOTE — Progress Notes (Signed)
Reviewed in detail d/c instructions with pt.  [facility] notified via call and fax of bipap changes.  (18/8 4L bleed).  Pt aware of f/u appt with Tammy on May 7 and to call her primary MD in 5 days for hospital f/u appt.  Discussed importance of taking medications as ordered and importance of compliance with f/u appt as well.  Spoke with Barbara Cower at [facility] to confirm receipt of bipap order.

## 2017-03-28 NOTE — Progress Notes (Signed)
PULMONARY / CRITICAL CARE MEDICINE   Name: Mary [name] MRN: [id number] DOB: [birth year]    ADMISSION DATE:  03/24/2017 CONSULTATION DATE:   03/25/17   REFERRING MD:  [facility]   CHIEF COMPLAINT:  Sob/cough   HISTORY OF PRESENT ILLNESS:    40 yowf MO smoker/ born premature "on vent for first 6 m but never trached" with severe copd complicated by hypoxemia/hypercarbia/ bipap dep also maint on daliresp/spiriva SMI and breo dpi admitted with acute flare      SUBJECTIVE:  Feels better.  On her baseline O2 2L at 94%.  No events per RN.  Plans for her to be discharged home later today.     Intake/Output Summary (Last 24 hours) at 03/28/17 1155 Last data filed at 03/28/17 0100  Gross per 24 hour  Intake             1400 ml  Output             2775 ml  Net            -1375 ml    PHYSICAL EXAMINATION: General:  Obese adult female sitting in bed in NAD HEENT: MM pink/moist, hoarse voice Neuro: AOx 3, non-focal, MAE CV: s1s2 rrr, no m/r/g PULM: even/non-labored, lungs bilaterally clear with good air movement, no wheezing GI: obese soft, non-tender, bsx4 active  Extremities: warm/dry, no edema  Skin: no rashes or lesions  LABS:  BMET  Recent Labs Lab 03/24/17 1354 03/25/17 0327  NA 139 136  K 3.4* 3.8  CL 88* 81*  CO2 41* 40*  BUN 7 7  CREATININE 0.53 0.53  GLUCOSE 215* 218*    Electrolytes  Recent Labs Lab 03/24/17 1354 03/25/17 0327  CALCIUM 8.6* 8.8*    CBC  Recent Labs Lab 03/24/17 1354 03/25/17 0327  WBC 10.9* 12.8*  HGB 13.6 13.5  HCT 45.5 45.2  PLT 139* 139*    Coag's No results for input(s): APTT, INR in the last 168 hours.  Sepsis Markers No results for input(s): LATICACIDVEN, PROCALCITON, O2SATVEN in the last 168 hours.  ABG No results for input(s): PHART, PCO2ART, PO2ART in the last 168 hours.  Liver Enzymes No results for input(s): AST, ALT, ALKPHOS, BILITOT, ALBUMIN in the last 168 hours.  Cardiac Enzymes No results for input(s):  TROPONINI, PROBNP in the last 168 hours.  Glucose  Recent Labs Lab 03/27/17 0733 03/27/17 1221 03/27/17 1258 03/27/17 1618 03/27/17 2119 03/28/17 0747  GLUCAP 388* 484* 406* 200* 248* 300*    Imaging No results found.      ASSESSMENT / PLAN:   Severe COPD/ acute exacerbation - pt preemie and active smoker both assoc with difficult to control copd - PT MM by [facility] AT screening 04/2012  - Continue dulera for now at discharge (has inhaler bedside) and switch to symbicort 160, BID - Continue spiriva respimat at discharge - Continue albuterol neb prn, mucinex, duo nebs TID at home - Continue prednisone taper - Take  daily with food x 4 days, then  daily x 4 days, then  daily x 4 days, then  daily x4 days then to her baseline of  daily  - follow up appt in pulmonary office on May 7, at 1600 with Tammy P, NP    Active smoking greatest concern> discussed  - Ongoing smoking cessation   ? Acid (or non-acid) GERD - Continue Protonix  Acute on chronic RF hypoxemic and hypercarbic  - multifactorial just as much OHS as COPD -  per [facility]- previous home BiPAP settings 12/5 - New BiPAP settings 18/5 with 4L bleed in to keep sats 88 -94 %   Posey Boyer, AGACNP-BC [place] [facility] Pgr: [phone] or if no answer [phone] 03/28/2017, 12:25 PM  STAFF NOTE: I, Rory Percy, MD FACP have personally reviewed patient's available data, including medical history, events of note, physical examination and test results as part of my evaluation. I have discussed with resident/NP and other care providers such as pharmacist, RN and RRT. In addition, I personally evaluated patient and elicited key findings of: awake, no distress on eden of bed, lungs incredible air movement for her and good air entry, no active bronchospasm, NIMV settings noted increased for home she states she can tolerate that, Bders regimen agree with, avoiding powders also  with upper aireay irritation, hope she will remain / get to her home O2 needs, her lung dz has progressed, she will f/u dr Hessie Knows. Tyson Alias, MD, FACP Pgr: [phone] [facility] 03/28/2017 3:20 PM

## 2017-03-28 NOTE — Discharge Instructions (Signed)

## 2017-03-28 NOTE — Care Management Note (Signed)
Case Management Note  Patient Details  Name: Mary [name] MRN: [id number] Date of Birth: [birth year]  Subjective/Objective:    Pt admitted with SOB                Action/Plan:   PTA from home - on home oxygen.   Expected Discharge Date:                  Expected Discharge Plan:  Home/Self Care  In-House Referral:     Discharge planning Services  CM Consult  Post Acute Care Choice:    Choice offered to:     DME Arranged:    DME Agency:     HH Arranged:    HH Agency:     Status of Service:     If discussed at [employer] of Stay Meetings, dates discussed:    Additional Comments: 03/28/2017 CM contacted by bedside nurse - pt has home BIPAP but needs to ensure [facility] has new BIPAP settings - CM requested Home BIPAP DME order with specific parameters  Cherylann Parr, RN 03/28/2017, 10:40 AM

## 2017-03-28 NOTE — Care Management Note (Signed)
Case Management Note  Patient Details  Name: GERARDA CONKLIN MRN: [id number] Date of Birth: 1976/03/31  Subjective/Objective:    Pt admitted with SOB and cough   Action/Plan:  PTA independent from home with husband.  Pt is on chronic oxygen 2-3 liters in home - currently on 2 liters sats 95% - pt also has BIPAP in the home that works but settings need to be changed - [facility] contacted by bedside nurse and given new settings.  Pt states she will call [facility] when she gets home to arrange time for them to come and set up new parameters.  PT has PCP and denied barriers to obtaining medications as prescribed.   Expected Discharge Date:                  Expected Discharge Plan:  Home/Self Care  In-House Referral:     Discharge planning Services  CM Consult  Post Acute Care Choice:    Choice offered to:  Patient  DME Arranged:    DME Agency:     HH Arranged:    HH Agency:     Status of Service:     If discussed at [employer], dates discussed:    Additional Comments:  Cherylann Parr, RN 03/28/2017, 11:02 AM

## 2017-03-28 NOTE — Discharge Summary (Signed)
Physician Discharge Summary  Mary [name] JYN:[id number] DOB: Jul 23, 1976  PCP: Jeanine Luz, FNP  Admit date: 03/24/2017 Discharge date: 03/28/2017  Recommendations for Outpatient Follow-up:  1. Jeanine Luz, FNP/PCP in 5 days with repeat labs (CBC & BMP). Diabetes management to be addressed at this visit. 2. [name], NP/Pulmonology on 04/11/17 at 4 PM. 3. Recommend follow-up chest x-ray in 4 weeks to ensure resolution of left basilar atelectasis or resolving infiltrate seen on chest x-ray 03/24/17.  Home Health: None Equipment/Devices: None    Discharge Condition: Improved and stable  CODE STATUS: Full  Diet recommendation: Heart healthy & diabetic diet.  Discharge Diagnoses:  Principal Problem:   COPD exacerbation (HCC) Active Problems:   OSA on BiPAP   Chronic right-sided CHF (congestive heart failure) (HCC)   Obesity hypoventilation syndrome (HCC)   Esophageal reflux   Morbid obesity (HCC)   Chronic diastolic heart failure (HCC)   Diabetes mellitus type 2 in obese (HCC)   Acute on chronic respiratory failure with hypoxia and hypercapnia (HCC)   Brief Summary: 41 year old female with PMH of severe COPD (Dr. 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 Admitted for COPD exacerbation and acute on chronic hypoxic and hypercapnic respiratory failure. Pulmonology consulted.   Assessment & Plan:   1. Acute COPD  exacerbation: Patient is home oxygen and steroid dependent at baseline. Severe COPD which is likely progressing in the context of ongoing tobacco abuse. Tobacco cessation counseled again. Treated with IV Solu-Medrol 40 mg every 12 hours. Titrated oxygen to maintain saturations between 89-92 percent. Continued BiPAP at night. Duonebs every 6 hourly and when necessary albuterol nebulizations. Flutter valve added. Just completed an oral prednisone taper and course of doxycycline on 4/18. Low index of clinical suspicion for infectious etiology and hence no antibiotics initiated. Blood cultures 2: Negative, final report. Recent pneumonia has clinically and radiologically improved/resolved. Consulted pulmonology due to severe COPD, progressively worsening symptoms and hypoxia over the last several weeks and severe hypoxia overnight on first night of hospital admission despite BiPAP and nasal cannula oxygen. As per pulmonology, etiology of worsening hypoxia may have been COPD exacerbation and her hypoxia may have been lagging behind. Auto BiPAP settings were changed in the hospital but she will need follow-up in the pulmonary clinic to review settings and may need repeat titration studies. Continue to clinically improve. No further hypoxic events overnight. Was able to titrate down her daytime oxygen to 2.5 L/m. On day of discharge, discussed with pulmonology who saw her in follow-up and made the following recommendations: Continue Dulera at discharge which she received in the hospital and once that has run out, switch to Symbicort 160 MG bid, continue Spiriva Respimat, continue Daliresp, continue albuterol inhaler when necessary, prednisone taper over the next 2 weeks down to her baseline of 5 mg daily maintenance, Protonix. DC Breo Ellipta. Pulmonology recommended avoiding powders that can contribute to upper airway irritation. Pulmonology coordinated with patient and home health services to change home BiPAP to new  settings as outlined below. They  have arranged an outpatient follow-up with pulmonology and cleared patient for discharge home. COPD exacerbation resolved. 2. Acute on chronic hypoxic and hypercapnic respiratory failure: Precipitated by COPD exacerbation. Management as above. Acute respiratory failure resolved. 3. Chronic right heart/diastolic CHF:Not significantly volume overloaded. Continue home dose of diuretics and potassium supplements. 4. OSA/OHS on nightly BiPAP: Management as stated above. As per her advanced home health, previous home BiPAP settings 12/5. As per pulmonology consultation and recommendations at discharge, new BiPAP settings 18/5 with 4 L bleed in to keep saturations 88-94 percent. 5. Tobacco abuse: Absolute cessation was counseled again. 6. Uncontrolled type II DM: Prior to admission, patient did take Lantus 25 units daily and Humalog 8 units 3 times a day, both as needed while on steroids. Initiated on Lantus and SSI in the hospital & increased to Lantus to 25 units daily, mealtime NovoLog and SSI. Patient was discharged on prior home dose of Lantus and Humalog was changed to sliding scale. She was clearly instructed that with tapering of her steroids, her glycemic control was likely to improve hence requiring lesser amounts of insulins. Thereby, she shouldn't closely follow-up with her outpatient PCP to manage her diabetes. She is well educated regarding DM monitoring and management and verbalized understanding.  7. GERD:PPI 8. Morbid obesity/Body mass index is 36.12 kg/m. 9. Recently treated fungemia 10. Hypokalemia:Replaced. 11. Thrombocytopenia: Unclear etiology. Stable. Follow CBCs as outpatient in a few days. 12. ? Sleepiness: unclear etiology. Resolved.    Consultants:  Pulmonology   Procedures BiPAP  Discharge Instructions  Discharge Instructions    (HEART FAILURE PATIENTS) Call MD:  Anytime you have any of the following symptoms: 1) 3 pound weight gain  in 24 hours or 5 pounds in 1 week 2) shortness of breath, with or without a dry hacking cough 3) swelling in the hands, feet or stomach 4) if you have to sleep on extra pillows at night in order to breathe.    Complete by:  As directed    Call MD for:  difficulty breathing, headache or visual disturbances    Complete by:  As directed    Call MD for:  extreme fatigue    Complete by:  As directed    Call MD for:  persistant dizziness or light-headedness    Complete by:  As directed    Diet - low sodium heart healthy    Complete by:  As directed    Diet Carb Modified    Complete by:  As directed    Discharge instructions    Complete by:  As directed    1) New BiPAP settings 18/5 with 4L bleed in to keep sats 88 -94 % 2) oxygen via nasal cannula during daytime at 2-3 L/m, adjust to keep saturations between 88-94 percent. 3) follow-up with your PCP regarding adjusting your insulins, as needed.   Increase activity slowly    Complete by:  As directed        Medication List    STOP taking these medications   BREO ELLIPTA 100-25 MCG/INH Aepb Generic drug:  fluticasone furoate-vilanterol     TAKE these medications   albuterol 108 (90 Base) MCG/ACT inhaler Commonly known as:  PROVENTIL HFA;VENTOLIN HFA Inhale 2 puffs into the lungs every 6 (six) hours as needed for wheezing or shortness of breath.   budesonide-formoterol 160-4.5 MCG/ACT inhaler Commonly known as:  SYMBICORT Inhale 2 puffs into the lungs 2 (two) times daily.   gabapentin 300 MG capsule Commonly known as:  NEURONTIN TAKE 2 CAPSULES BY MOUTH 3 TIMES DAILY.   guaiFENesin 600 MG 12 hr tablet Commonly known as:  MUCINEX Take 1 tablet (600 mg total) by mouth 2 (two) times daily.   insulin glargine 100 UNIT/ML injection Commonly known as:  LANTUS Inject 0.25 mLs (25 Units total) into the skin daily. Start taking on:  03/29/2017 What changed:  when to take this  reasons to take this   insulin lispro 100 UNIT/ML  injection Commonly known as:  HUMALOG Inject 0-0.15 mLs (0-15 Units total) into the skin 3 (three) times daily with meals. CBG < 70: Eat or drink something right away and recheck. CBG 70 - 120: 0 units CBG 121 - 150: 2 units CBG 151 - 200: 3 units CBG 201 - 250: 5 units CBG 251 - 300: 8 units CBG 301 - 350: 11 units CBG 351 - 400: 15 units CBG > 400: call MD. What changed:  how much to take  when to take this  reasons to take this  additional instructions   LYRICA 50 MG capsule Generic drug:  pregabalin TAKE 1 CAPSULE BY MOUTH 3 TIMES DAILY   pantoprazole 40 MG tablet Commonly known as:  PROTONIX Take 1 tablet (40 mg total) by mouth 2 (two) times daily before a meal. What changed:  when to take this   potassium chloride SA 20 MEQ tablet Commonly known as:  K-DUR,KLOR-CON Take 1 tablet (20 mEq total) by mouth daily.   predniSONE 10 MG tablet Commonly known as:  DELTASONE Take 4 tabs  daily with food x 4 days, then 3 tabs daily x 4 days, then 2 tabs daily x 4 days, then 1 tab daily x4 days then stop. #40 What changed:  You were already taking a medication with the same name, and this prescription was added. Make sure you understand how and when to take each.   predniSONE 5 MG tablet Commonly known as:  DELTASONE Resume 5 mg daily after you have completed the 10mg  tab taper What changed:  additional instructions   roflumilast 500 MCG Tabs tablet Commonly known as:  DALIRESP Take 500 mcg by mouth daily.   SPIRIVA RESPIMAT 1.25 MCG/ACT Aers Generic drug:  Tiotropium Bromide Monohydrate INHALE 2 PUFFS INTO THE LUNGS 2 (TWO) TIMES DAILY. What changed:  when to take this   torsemide 20 MG tablet Commonly known as:  DEMADEX Take 1 tablet (20 mg total) by mouth daily.   traMADol 50 MG tablet Commonly known as:  ULTRAM TAKE 1 TABLET EVERY 6 HOURS AS NEEDED FOR SEVERE PAIN      Follow-up Information    [facility] Follow up on 04/11/2017.   Specialty:   Pulmonology Why:  at 4 pm with Rikki Spearing, NP Contact information: [address] [place] [address] [phone]       Jeanine Luz, FNP. Schedule an appointment as soon as possible for a visit in 5 day(s).   Specialty:  Family Medicine Why:  To be seen with repeat labs (CBC & BMP). Diabetes management to be addressed too. Contact information: [address] [place] [address] [phone]          No Known Allergies    Procedures/Studies: Dg Chest 2 View  Result Date: 03/24/2017 CLINICAL DATA:  Weakness, cough and shortness of breath. EXAM: CHEST  2 VIEW COMPARISON:  Chest x-ray 03/14/2017 FINDINGS: The heart is mildly enlarged but stable. Prominent left pulmonary hilum due to prominent pulmonary arteries.  Some persistent left lower lobe atelectasis or infiltrate. No pleural effusion. The bony thorax is intact. IMPRESSION: Residual streaky left basilar atelectasis or resolving infiltrate. No pleural effusion. Electronically Signed   By: Rudie Meyer M.D.   On: 03/24/2017 15:00      Subjective: Seen this morning. Denied complaints. Stated that this was the best that she felt in a long time. He has been sleeping well in the hospital over the last couple of nights without difficulty breathing or hypoxic episodes. No dyspnea, cough or chest pain reported. Hoarseness also seems to have improved.  Discharge Exam:  Vitals:   03/28/17 0900 03/28/17 1000 03/28/17 1100 03/28/17 1322  BP:    112/79  Pulse: (!) 106 89 86 92  Resp: 18 18 (!) 23 12  Temp:    98.1 F (36.7 C)  TempSrc:    Oral  SpO2: 93% 97% 98% 91%  Weight:      Height:        General exam: Pleasant young female, moderately built and obese, sitt ing up comfortably in bed. Chronic hoarseness-Seems better.Marland Kitchen Respiratory system: Clear to auscultation without wheezing, rhonchi or crackles. Respiratory effort normal. Cardiovascular system: S1 & S2 heard, RRR. No JVD, murmurs, rubs, gallops  or clicks. Trace ankle edema. Tele: Sinus Rhythm  Gastrointestinal system: Abdomen is nondistended, soft and nontender. No organomegaly or masses felt. Normal bowel sounds heard. Central nervous system: Alert and oriented. No focal neurological deficits. Extremities: Symmetric 5 x 5 power. Skin: No rashes, lesions or ulcers Psychiatry: Judgement and insight appear normal. Mood & affect appropriate.     The results of significant diagnostics from this hospitalization (including imaging, microbiology, ancillary and laboratory) are listed below for reference.     Microbiology: Recent Results (from the past 240 hour(s))  Blood culture (routine x 2)     Status: None (Preliminary result)   Collection Time: 03/24/17  3:08 PM  Result Value Ref Range Status   Specimen Description BLOOD LEFT ANTECUBITAL  Final   Special Requests   Final    BOTTLES DRAWN AEROBIC AND ANAEROBIC Blood Culture results may not be optimal due to an excessive volume of blood received in culture bottles   Culture NO GROWTH 4 DAYS  Final   Report Status PENDING  Incomplete  Blood culture (routine x 2)     Status: None (Preliminary result)   Collection Time: 03/24/17  4:17 PM  Result Value Ref Range Status   Specimen Description BLOOD RIGHT HAND  Final   Special Requests   Final    BOTTLES DRAWN AEROBIC AND ANAEROBIC Blood Culture adequate volume   Culture NO GROWTH 4 DAYS  Final   Report Status PENDING  Incomplete  MRSA PCR Screening     Status: None   Collection Time: 03/25/17  1:51 PM  Result Value Ref Range Status   MRSA by PCR NEGATIVE NEGATIVE Final    Comment:        The GeneXpert MRSA Assay (FDA approved for NASAL specimens only), is one component of a comprehensive MRSA colonization surveillance program. It is not intended to diagnose MRSA infection nor to guide or monitor treatment for MRSA infections.      Labs: CBC:  Recent Labs Lab 03/24/17 1354 03/25/17 0327  WBC 10.9* 12.8*  HGB 13.6  13.5  HCT 45.5 45.2  MCV 106.3* 104.1*  PLT 139* 139*   Basic Metabolic Panel:  Recent Labs Lab 03/24/17 1354 03/25/17 0327  NA 139 136  K  3.4* 3.8  CL 88* 81*  CO2 41* 40*  GLUCOSE 215* 218*  BUN 7 7  CREATININE 0.53 0.53  CALCIUM 8.6* 8.8*   BNP (last 3 results)  Recent Labs  07/18/16 0400 08/25/16 0713 02/09/17 0340  BNP 100.3* 23.4 104.2*   CBG:  Recent Labs Lab 03/27/17 1258 03/27/17 1618 03/27/17 2119 03/28/17 0747 03/28/17 1242  GLUCAP 406* 200* 248* 300* 196*      Time coordinating discharge: Over 30 minutes  SIGNED:  Marcellus Scott, MD, FACP, FHM. [facility] Pager [phone]  If 7PM-7AM, please contact night-coverage [email or web address] Password [facility] 03/28/2017, 2:38 PM

## 2017-03-29 LAB — CULTURE, BLOOD (ROUTINE X 2)
CULTURE: NO GROWTH
CULTURE: NO GROWTH
SPECIAL REQUESTS: ADEQUATE

## 2017-04-04 ENCOUNTER — Other Ambulatory Visit: Payer: Self-pay | Admitting: Internal Medicine

## 2017-04-06 ENCOUNTER — Other Ambulatory Visit: Payer: Self-pay | Admitting: Emergency Medicine

## 2017-04-06 MED ORDER — TIOTROPIUM BROMIDE MONOHYDRATE 1.25 MCG/ACT IN AERS: 2.0000 | INHALATION_SPRAY | Freq: Every day | RESPIRATORY_TRACT | 3 refills | Status: DC

## 2017-04-08 DIAGNOSIS — I270 Primary pulmonary hypertension: Secondary | ICD-10-CM | POA: Diagnosis not present

## 2017-04-08 DIAGNOSIS — J449 Chronic obstructive pulmonary disease, unspecified: Secondary | ICD-10-CM | POA: Diagnosis not present

## 2017-04-08 DIAGNOSIS — G4733 Obstructive sleep apnea (adult) (pediatric): Secondary | ICD-10-CM | POA: Diagnosis not present

## 2017-04-10 DIAGNOSIS — J449 Chronic obstructive pulmonary disease, unspecified: Secondary | ICD-10-CM | POA: Diagnosis not present

## 2017-04-10 DIAGNOSIS — I270 Primary pulmonary hypertension: Secondary | ICD-10-CM | POA: Diagnosis not present

## 2017-04-10 DIAGNOSIS — G4733 Obstructive sleep apnea (adult) (pediatric): Secondary | ICD-10-CM | POA: Diagnosis not present

## 2017-04-11 ENCOUNTER — Inpatient Hospital Stay: Payer: 59 | Admitting: Adult Health

## 2017-04-13 ENCOUNTER — Inpatient Hospital Stay: Payer: 59 | Admitting: Acute Care

## 2017-04-19 ENCOUNTER — Emergency Department (HOSPITAL_COMMUNITY): Payer: 59

## 2017-04-19 ENCOUNTER — Telehealth: Payer: Self-pay | Admitting: Internal Medicine

## 2017-04-19 ENCOUNTER — Other Ambulatory Visit: Payer: Self-pay

## 2017-04-19 ENCOUNTER — Emergency Department (HOSPITAL_COMMUNITY)
Admission: EM | Admit: 2017-04-19 | Discharge: 2017-04-20 | Disposition: A | Discharge status: Home / Self Care | Payer: 59 | Attending: Emergency Medicine | Admitting: Emergency Medicine

## 2017-04-19 DIAGNOSIS — Y92009 Unspecified place in unspecified non-institutional (private) residence as the place of occurrence of the external cause: Secondary | ICD-10-CM | POA: Diagnosis not present

## 2017-04-19 DIAGNOSIS — S99922A Unspecified injury of left foot, initial encounter: Secondary | ICD-10-CM | POA: Diagnosis not present

## 2017-04-19 DIAGNOSIS — J9610 Chronic respiratory failure, unspecified whether with hypoxia or hypercapnia: Secondary | ICD-10-CM | POA: Insufficient documentation

## 2017-04-19 DIAGNOSIS — J449 Chronic obstructive pulmonary disease, unspecified: Secondary | ICD-10-CM | POA: Insufficient documentation

## 2017-04-19 DIAGNOSIS — Z79899 Other long term (current) drug therapy: Secondary | ICD-10-CM | POA: Insufficient documentation

## 2017-04-19 DIAGNOSIS — E1140 Type 2 diabetes mellitus with diabetic neuropathy, unspecified: Secondary | ICD-10-CM | POA: Insufficient documentation

## 2017-04-19 DIAGNOSIS — W19XXXA Unspecified fall, initial encounter: Secondary | ICD-10-CM | POA: Insufficient documentation

## 2017-04-19 DIAGNOSIS — R0602 Shortness of breath: Secondary | ICD-10-CM | POA: Diagnosis not present

## 2017-04-19 DIAGNOSIS — Y999 Unspecified external cause status: Secondary | ICD-10-CM | POA: Diagnosis not present

## 2017-04-19 DIAGNOSIS — F17210 Nicotine dependence, cigarettes, uncomplicated: Secondary | ICD-10-CM | POA: Insufficient documentation

## 2017-04-19 DIAGNOSIS — S99912A Unspecified injury of left ankle, initial encounter: Secondary | ICD-10-CM | POA: Diagnosis present

## 2017-04-19 DIAGNOSIS — S93492A Sprain of other ligament of left ankle, initial encounter: Secondary | ICD-10-CM | POA: Insufficient documentation

## 2017-04-19 DIAGNOSIS — J9611 Chronic respiratory failure with hypoxia: Secondary | ICD-10-CM

## 2017-04-19 DIAGNOSIS — R42 Dizziness and giddiness: Secondary | ICD-10-CM | POA: Diagnosis not present

## 2017-04-19 DIAGNOSIS — I5032 Chronic diastolic (congestive) heart failure: Secondary | ICD-10-CM | POA: Insufficient documentation

## 2017-04-19 DIAGNOSIS — T148XXA Other injury of unspecified body region, initial encounter: Secondary | ICD-10-CM | POA: Diagnosis not present

## 2017-04-19 DIAGNOSIS — Z794 Long term (current) use of insulin: Secondary | ICD-10-CM | POA: Insufficient documentation

## 2017-04-19 DIAGNOSIS — M7989 Other specified soft tissue disorders: Secondary | ICD-10-CM | POA: Diagnosis not present

## 2017-04-19 DIAGNOSIS — J9612 Chronic respiratory failure with hypercapnia: Secondary | ICD-10-CM

## 2017-04-19 DIAGNOSIS — Y9389 Activity, other specified: Secondary | ICD-10-CM | POA: Diagnosis not present

## 2017-04-19 DIAGNOSIS — I110 Hypertensive heart disease with heart failure: Secondary | ICD-10-CM | POA: Insufficient documentation

## 2017-04-19 DIAGNOSIS — R061 Stridor: Secondary | ICD-10-CM | POA: Diagnosis not present

## 2017-04-19 NOTE — Telephone Encounter (Signed)
Spoke with unnamed man, pt was currently being evaluated by EMS after an episode of syncope.    Will call back.

## 2017-04-19 NOTE — ED Provider Notes (Signed)
[facility]-EMERGENCY DEPT Provider Note   CSN: [id number] Arrival date & time: 04/19/17  2314  By signing my name below, I, Phillips ClimesFabiola de Louis, attest that this documentation has been prepared under the direction and in the presence of Tabithia Stroder, Mayer Maskerourtney F, MD . Electronically Signed: Phillips ClimesFabiola de Louis, Scribe. 04/20/2017. 3:24 AM.  History   Chief Complaint Chief Complaint  Patient presents with  . Shortness of Breath  . Loss of Consciousness  . Ankle Pain    Mary [name] is a 41 y.o. female with a PMHx consisting of severe COPD, oxygen dependent (2.5-3 L/m in the past but for the last month on 4 L/m), steroid dependent (prednisone 5 MG daily), ongoing tobacco abuse and alcohol use, who presents to the Emergency Department with complaints of a fall, after feeling lightheaded 2/2 to an episode of dyspnea. No syncope and no LOC. Pt was exerting herself outside and not connected to her O2 tank at time of episode.Patient reports that she fell down onto her left ankle. She initially states that she "didn't think it was that bad" and was given weight tomorrow to be seen but subsequently had increasing pain in her right ankle. She rates her pain a 10 out of 10. She noted swelling and bruising. She asked her husband to call an ambulance.  Per EMS to triage, pt initially with an O2sat of 79% on baseline 3L [place]. Received 15 albuterol, 1.0 atrovent, 125 solumedrol and 100mcg fentanyl enroute to ED.  Here, pt's dyspnea is resolved and her main complaint is left ankle pain and edema. She reports that she is up 1L/m of O2 from her baseline, at 4 L/m, however, she was dependent on 4L/m during her last admission on 03/24/2017. Patient does state that over the last 2 days she has increased prednisone at home.  Pt denies experiencing any other acute sx, including cough, fever or chest pain.  The history is provided by the patient, medical records and the EMS personnel.    Past Medical History:  Diagnosis Date  .  Axillary adenopathy    right axillary adenopathy noted on CT chest (03/06/2012)  . Chronic right-sided CHF (congestive heart failure) (HCC) 03/23/2012   with cor pulmonale. Last RHC 03/2012  . COPD (chronic obstructive pulmonary disease) (HCC)    PFTS 03/08/12: fev1 0.58L/1%, FVC 1.18/33%, Ratop 49 and c/w ssevere obstruction. 21% BD response on FVC, RV 219%, DLCO 11/54%  . DVT of upper extremity (deep vein thrombosis) [facility](HCC) April 2013   right subclavian // Unclear precipitating cause - possibly significant right heart failure, with vascular stasis potentially predisposing to clotting.    . Exertional shortness of breath   . Hepatic cirrhosis (HCC)    Questionable history of - Noted on CT abdomen (03/2012) - thought to be due to vascular congestion from right heart failure +/- patient's history of alcohol abuse  . History of alcoholism (HCC)   . History of chronic bronchitis   . Irregular menses   . Migraine    "~ 1/yr" (05/03/2013)  . Moderate to severe pulmonary hypertension [facility](HCC) April 2013   Cardiac cath on 03/06/12 - 1. Elevated pulmonary artery pressures, right sided filling pressures.,  2. PA: 64/45 (mean 53)    . On home oxygen therapy    "2-3 L 24/7" (05/03/2013)  . OSA (obstructive sleep apnea)    wears noctural BiPAP (05/03/2013)  . Periodontal disease   . Pneumonia ~ 1985; 01/2011  . Pulmonary embolus [facility](HCC) April 2013  Precipitating cause unclear. Was treated with coumadin from April-June 2013.  . Pulmonary hypertension [facility])     Patient Active Problem List   Diagnosis Date Noted  . Fungemia 02/13/2017  . COPD (chronic obstructive pulmonary disease) (HCC) 02/13/2017  . Healthcare-associated pneumonia 02/09/2017  . Alcohol abuse 01/31/2017  . Acute on chronic respiratory failure with hypoxia and hypercapnia (HCC)   . Tracheomalacia   . Palliative care by specialist   . Anxiety state   . Advance directive declined by patient   . Goals of care, counseling/discussion   .  Chronic hoarseness 07/27/2016  . Diabetic neuropathy (HCC) 04/30/2016  . Diabetes mellitus type 2 in obese (HCC) 04/10/2016  . COPD exacerbation (HCC) 04/01/2016  . Chronic diastolic heart failure (HCC) 01/25/2016  . Chronic respiratory failure with hypoxia (HCC) 01/21/2016  . PAH (pulmonary artery hypertension) (HCC)   . Constipation 11/18/2015  . Morbid obesity (HCC) 11/18/2015  . Lung nodule 01/30/2015  . Pulmonary mass 01/13/2015  . Gastroparesis   . Esophageal reflux   . Obstipation   . Hypokalemia 02/28/2013  . Financial difficulties 02/28/2013  . Obesity hypoventilation syndrome (HCC) 02/28/2013  . Myalgia 02/28/2013  . Anovulation 06/30/2012  . Preventative health care 05/05/2012  . Irregular menstrual cycle 04/27/2012  . Chronic right-sided CHF (congestive heart failure) (HCC) 03/23/2012  . Pulmonary hypertension (HCC) 03/23/2012  . Cor pulmonale (HCC) 03/21/2012  . OSA on BiPAP 03/19/2012  . Axillary lymphadenopathy 03/06/2012  . Congenital heart disease 01/28/2012  . Dental caries 01/28/2012    Past Surgical History:  Procedure Laterality Date  . APPENDECTOMY  05/03/2013  . CARDIAC CATHETERIZATION  03/2012  . CARDIAC CATHETERIZATION N/A 01/08/2016   Procedure: Right Heart Cath;  Surgeon: Dolores Patty, MD;  Location: [facility] INVASIVE CV LAB;  Service: Cardiovascular;  Laterality: N/A;  . CARDIAC SURGERY  03-10-77   "my heart was backwards" (05/03/2013)  . LAPAROSCOPIC APPENDECTOMY N/A 05/03/2013   Procedure: APPENDECTOMY LAPAROSCOPIC;  Surgeon: Cherylynn Ridges, MD;  Location: [facility] OR;  Service: General;  Laterality: N/A;  . LUNG SURGERY  1977/11/16  . RIGHT HEART CATHETERIZATION N/A 03/07/2012   Procedure: RIGHT HEART CATH;  Surgeon: Kathleene Hazel, MD;  Location: [facility] CATH LAB;  Service: Cardiovascular;  Laterality: N/A;    OB History    No data available       Home Medications    Prior to Admission medications   Medication Sig Start Date End Date Taking?  Authorizing Provider  albuterol (PROVENTIL HFA;VENTOLIN HFA) 108 (90 BASE) MCG/ACT inhaler Inhale 2 puffs into the lungs every 6 (six) hours as needed for wheezing or shortness of breath. 09/06/14   Kalman Shan, MD  budesonide-formoterol (SYMBICORT) 160-4.5 MCG/ACT inhaler Inhale 2 puffs into the lungs 2 (two) times daily. 03/28/17   Simonne Martinet, NP  gabapentin (NEURONTIN) 300 MG capsule TAKE 2 CAPSULES BY MOUTH 3 TIMES DAILY. 12/14/16   Veryl Speak, FNP  guaiFENesin (MUCINEX) 600 MG 12 hr tablet Take 1 tablet (600 mg total) by mouth 2 (two) times daily. 02/11/17   Vassie Loll, MD  HYDROcodone-acetaminophen (NORCO/VICODIN) 5-325 MG tablet Take 1-2 tablets by mouth every 6 (six) hours as needed. 04/20/17   Cas Tracz, Mayer Masker, MD  insulin glargine (LANTUS) 100 UNIT/ML injection Inject 0.25 mLs (25 Units total) into the skin daily. 03/29/17   Hongalgi, Maximino Greenland, MD  insulin lispro (HUMALOG) 100 UNIT/ML injection Inject 0-0.15 mLs (0-15 Units total) into the skin 3 (three) times daily with meals.  CBG < 70: Eat or drink something right away and recheck. CBG 70 - 120: 0 units CBG 121 - 150: 2 units CBG 151 - 200: 3 units CBG 201 - 250: 5 units CBG 251 - 300: 8 units CBG 301 - 350: 11 units CBG 351 - 400: 15 units CBG > 400: call MD. 03/28/17   Elease Etienne, MD  LYRICA 50 MG capsule TAKE 1 CAPSULE BY MOUTH 3 TIMES DAILY 02/07/17   Veryl Speak, FNP  pantoprazole (PROTONIX) 40 MG tablet Take 1 tablet (40 mg total) by mouth 2 (two) times daily before a meal. Patient taking differently: Take 40 mg by mouth daily.  07/22/16   Jeralyn Bennett, MD  potassium chloride SA (K-DUR,KLOR-CON) 20 MEQ tablet Take 1 tablet (20 mEq total) by mouth daily. 01/29/16   Bensimhon, Bevelyn Buckles, MD  predniSONE (DELTASONE) 20 MG tablet Take 2 tablets (40 mg total) by mouth daily. 04/20/17   Adjoa Althouse, Mayer Masker, MD  predniSONE (DELTASONE) 5 MG tablet Resume 5 mg daily after you have completed the 10mg  tab taper  03/28/17   Simonne Martinet, NP  roflumilast (DALIRESP) 500 MCG TABS tablet Take 500 mcg by mouth daily.    [provider]  Tiotropium Bromide Monohydrate (SPIRIVA RESPIMAT) 1.25 MCG/ACT AERS Inhale 2 puffs into the lungs daily. 04/06/17   Kalman Shan, MD  torsemide (DEMADEX) 20 MG tablet Take 1 tablet (20 mg total) by mouth daily. 12/03/16   Kalman Shan, MD  traMADol (ULTRAM) 50 MG tablet TAKE 1 TABLET EVERY 6 HOURS AS NEEDED FOR SEVERE PAIN 03/23/17   Veryl Speak, FNP    Family History Family History  Problem Relation Age of Onset  . Hypertension Father   . Heart disease Father        CHF; died age 70   . Alcohol abuse Father   . Other Brother        died age 3 y.o overdose    Social History Social History  Substance Use Topics  . Smoking status: Current Some Day Smoker    Packs/day: 1.00    Years: 25.00    Types: Cigarettes    Last attempt to quit: 02/18/2012  . Smokeless tobacco: Never Used     Comment: smokes 1-2 cigs per day (03/14/17)  . Alcohol use No     Comment: previously drinking 1 pint 3-4 days a week for 2002-2013, but in 2014, occasional drinking now      Allergies   Patient has no known allergies.   Review of Systems Review of Systems  Constitutional: Negative for fever.  Respiratory: Positive for shortness of breath. Negative for cough.   Cardiovascular: Negative for chest pain.  Gastrointestinal: Negative for abdominal pain.  Musculoskeletal: Positive for arthralgias and joint swelling.  Neurological: Positive for light-headedness. Negative for syncope.  All other systems reviewed and are negative.   Physical Exam Updated Vital Signs BP 139/78   Pulse (!) 106   Temp 98.1 F (36.7 C) (Oral)   Resp 20   Ht 5' 2.5" (1.588 m)   Wt 203 lb (92.1 kg)   SpO2 90%   BMI 36.54 kg/m   Physical Exam  Constitutional: She is oriented to person, place, and time. No distress.  Chronically ill-appearing, no acute distress  HENT:    Head: Normocephalic and atraumatic.  Eyes: Pupils are equal, round, and reactive to light.  Cardiovascular: Regular rhythm and normal heart sounds.   Tachycardia  Pulmonary/Chest: Effort normal.  No respiratory distress. She has wheezes.  Nasal cannula in place, fair movement, scant expiratory wheeze  Abdominal: Soft. Bowel sounds are normal. There is no tenderness. There is no guarding.  Musculoskeletal:  Decreased range of motion of the left ankle, diffuse swelling about the ankle with bruising noted both medial and laterally, no obvious deformities, no proximal fibular tenderness, 2+ DP pulse  Neurological: She is alert and oriented to person, place, and time.  Skin: Skin is warm and dry.  Psychiatric: She has a normal mood and affect.  Nursing note and vitals reviewed.   ED Treatments / Results  DIAGNOSTIC STUDIES: Oxygen Saturation is 97% on [place].  COORDINATION OF CARE: 11:54 PM Discussed treatment plan with pt at bedside and pt agreed to plan.  Labs (all labs ordered are listed, but only abnormal results are displayed) Labs Reviewed  CBC WITH DIFFERENTIAL/PLATELET - Abnormal; Notable for the following:       Result Value   HCT 46.2 (*)    MCV 101.5 (*)    Platelets 149 (*)    Neutro Abs 8.1 (*)    All other components within normal limits  BASIC METABOLIC PANEL - Abnormal; Notable for the following:    Potassium 3.1 (*)    Chloride 84 (*)    CO2 41 (*)    Glucose, Bld 230 (*)    All other components within normal limits  BLOOD GAS, ARTERIAL - Abnormal; Notable for the following:    pH, Arterial 7.303 (*)    pCO2 arterial 92.8 (*)    pO2, Arterial 54.5 (*)    Bicarbonate 44.6 (*)    Acid-Base Excess 17.5 (*)    All other components within normal limits  I-STAT TROPOININ, ED    EKG  EKG Interpretation None      ED ECG REPORT   Date: 04/20/2017  Rate: 117  Rhythm: sinus tachycardia  QRS Axis: normal  Intervals: normal  ST/T Wave abnormalities: normal   Conduction Disutrbances:none  Narrative Interpretation:   Old EKG Reviewed: unchanged  I have personally reviewed the EKG tracing and agree with the computerized printout as noted.   Radiology Dg Ankle Complete Left  Result Date: 04/20/2017 CLINICAL DATA:  Fall with ankle swelling EXAM: LEFT ANKLE COMPLETE - 3+ VIEW COMPARISON:  None. FINDINGS: There is no evidence of fracture, dislocation, or joint effusion. There is no evidence of arthropathy or other focal bone abnormality. Moderate soft tissue swelling circumferentially. IMPRESSION: Circumferential left ankle soft tissue swelling without fracture or dislocation. Electronically Signed   By: Deatra Robinson M.D.   On: 04/20/2017 00:46   Dg Chest Portable 1 View  Result Date: 04/20/2017 CLINICAL DATA:  Shortness of breath. EXAM: PORTABLE CHEST 1 VIEW COMPARISON:  Radiograph 03/24/2017.  Chest CT 02/01/2017 FINDINGS: Unchanged heart size and mediastinal contours. The lungs are hyperinflated. There is increased peribronchial thickening from prior. Streaky bibasilar opacities, left lung base not well visualized due to overlapping soft tissue structures. No large pleural effusion or pneumothorax. Grossly stable osseous structures. IMPRESSION: Increased peribronchial thickening, may be infectious or inflammatory, less likely pulmonary edema. Streaky bibasilar opacities. The left lung base is only assessed due to overlapping soft tissue structures. PA and lateral views may be helpful when patient is able. Electronically Signed   By: Rubye Oaks M.D.   On: 04/20/2017 00:33    Procedures Procedures (including critical care time)  Medications Ordered in ED Medications  morphine 4 MG/ML injection 4 mg (4 mg Intravenous Given 04/20/17  0144)     Initial Impression / Assessment and Plan / ED Course  I have reviewed the triage vital signs and the nursing notes.  Pertinent labs & imaging results that were available during my care of the patient were  reviewed by me and considered in my medical decision making (see chart for details).     Patient presents with a fall after being lightheaded while off her oxygen at home. Per the patient she is back to baseline from a respiratory standpoint. She does have evidence of trauma to left ankle. She is on 4 L of nasal cannula. She does have some scant wheezing. She is artery received steroids. She is a chronic respiratory failure patient with chronic compensated primary respiratory acidosis. Lab work is largely reassuring. X-ray does not show any evidence of pneumonia. Ankle films are negative. We'll treat as a sprain. Patient was given an ankle brace and crutches. I will prescribe burst dose steroids given recent increased shortness of breath and self titration of steroids at home with hypoxia. She needs to closely follow-up with her pulmonologist.  After history, exam, and medical workup I feel the patient has been appropriately medically screened and is safe for discharge home. Pertinent diagnoses were discussed with the patient. Patient was given return precautions.   Final Clinical Impressions(s) / ED Diagnoses   Final diagnoses:  Chronic respiratory failure with hypoxia and hypercapnia (HCC)  Sprain of other ligament of left ankle, initial encounter  Lightheadedness    New Prescriptions New Prescriptions   HYDROCODONE-ACETAMINOPHEN (NORCO/VICODIN) 5-325 MG TABLET    Take 1-2 tablets by mouth every 6 (six) hours as needed.   PREDNISONE (DELTASONE) 20 MG TABLET    Take 2 tablets (40 mg total) by mouth daily.   I personally performed the services described in this documentation, which was scribed in my presence. The recorded information has been reviewed and is accurate.    Shon Baton, MD 04/20/17 [phone]

## 2017-04-19 NOTE — ED Triage Notes (Signed)
Pt arrived from home with reports of L ankle pain s/p syncopal episode earlier today. Pt refused transport to hospital earlier tonight after she had an episode of SOB and a syncopal episode she received 10 albuterol .5 atrovent at approx 1800. She called EMS again for c/o pain to her left ankle and was found with an O2 sat of 79 on her normal 3L [place]. She received 15 albuterol, 1.0 atrovent, 125 solumedrol, fentanyl enroute to ED. Bruising present to left ankle.

## 2017-04-20 ENCOUNTER — Emergency Department (HOSPITAL_COMMUNITY): Payer: 59

## 2017-04-20 DIAGNOSIS — S99912A Unspecified injury of left ankle, initial encounter: Secondary | ICD-10-CM | POA: Diagnosis not present

## 2017-04-20 DIAGNOSIS — R0602 Shortness of breath: Secondary | ICD-10-CM | POA: Diagnosis not present

## 2017-04-20 DIAGNOSIS — M7989 Other specified soft tissue disorders: Secondary | ICD-10-CM | POA: Diagnosis not present

## 2017-04-20 LAB — CBC WITH DIFFERENTIAL/PLATELET
Basophils Absolute: 0 10*3/uL (ref 0.0–0.1)
Basophils Relative: 0 %
EOS PCT: 1 %
Eosinophils Absolute: 0.1 10*3/uL (ref 0.0–0.7)
HCT: 46.2 % — ABNORMAL HIGH (ref 36.0–46.0)
HEMOGLOBIN: 14.2 g/dL (ref 12.0–15.0)
LYMPHS ABS: 1 10*3/uL (ref 0.7–4.0)
LYMPHS PCT: 11 %
MCH: 31.2 pg (ref 26.0–34.0)
MCHC: 30.7 g/dL (ref 30.0–36.0)
MCV: 101.5 fL — AB (ref 78.0–100.0)
Monocytes Absolute: 0.4 10*3/uL (ref 0.1–1.0)
Monocytes Relative: 5 %
NEUTROS PCT: 83 %
Neutro Abs: 8.1 10*3/uL — ABNORMAL HIGH (ref 1.7–7.7)
Platelets: 149 10*3/uL — ABNORMAL LOW (ref 150–400)
RBC: 4.55 MIL/uL (ref 3.87–5.11)
RDW: 14.6 % (ref 11.5–15.5)
WBC: 9.6 10*3/uL (ref 4.0–10.5)

## 2017-04-20 LAB — BASIC METABOLIC PANEL
Anion gap: 14 (ref 5–15)
BUN: 7 mg/dL (ref 6–20)
CHLORIDE: 84 mmol/L — AB (ref 101–111)
CO2: 41 mmol/L — ABNORMAL HIGH (ref 22–32)
CREATININE: 0.47 mg/dL (ref 0.44–1.00)
Calcium: 9.3 mg/dL (ref 8.9–10.3)
GFR calc Af Amer: >60 mL/min (ref >60)
GFR calc non Af Amer: >60 mL/min (ref >60)
GLUCOSE: 230 mg/dL — AB (ref 65–99)
POTASSIUM: 3.1 mmol/L — AB (ref 3.5–5.1)
Sodium: 139 mmol/L (ref 135–145)

## 2017-04-20 LAB — BLOOD GAS, ARTERIAL
Acid-Base Excess: 17.5 mmol/L — ABNORMAL HIGH (ref 0.0–2.0)
BICARBONATE: 44.6 mmol/L — AB (ref 20.0–28.0)
DRAWN BY: 44166
FIO2: 32
O2 Saturation: 85.4 %
Patient temperature: 98.6
pCO2 arterial: 92.8 mmHg (ref 32.0–48.0)
pH, Arterial: 7.303 — ABNORMAL LOW (ref 7.350–7.450)
pO2, Arterial: 54.5 mmHg — ABNORMAL LOW (ref 83.0–108.0)

## 2017-04-20 LAB — I-STAT TROPONIN, ED: Troponin i, poc: 0 ng/mL (ref 0.00–0.08)

## 2017-04-20 MED ORDER — MORPHINE SULFATE (PF) 4 MG/ML IV SOLN
4.0000 mg | Freq: Once | INTRAVENOUS | Status: AC
  Administered 2017-04-20: 4 mg via INTRAVENOUS
  Filled 2017-04-20: qty 1

## 2017-04-20 MED ORDER — PREDNISONE 20 MG PO TABS: 40.0000 mg | ORAL_TABLET | Freq: Every day | ORAL | 0 refills | Status: DC

## 2017-04-20 MED ORDER — HYDROCODONE-ACETAMINOPHEN 5-325 MG PO TABS: 1.0000 | ORAL_TABLET | Freq: Four times a day (QID) | ORAL | 0 refills | Status: DC | PRN

## 2017-04-20 NOTE — Progress Notes (Signed)
Orthopedic Tech Progress Note Patient Details:  Mary HakimMichelle L [name] 05/10/1976 [id number]  Ortho Devices Type of Ortho Device: ASO, Crutches Ortho Device/Splint Location: lle Ortho Device/Splint Interventions: Ordered, Application Dr approved aso.  Mary [name], Mary [name] 04/20/2017, 3:36 AM

## 2017-04-20 NOTE — Discharge Instructions (Signed)
You were seen today for lightheadedness and near syncope. This is likely related to not wearing her oxygen. You have chronic respiratory failure. I will increase her prednisone to 40 mg for the next 4 days. Follow-up closely with your pulmonologist. It appears she has a left ankle sprain. Crutches, rest, ice, and elevation as needed.

## 2017-04-20 NOTE — ED Notes (Addendum)
Paged ortho for ankle splint and crutches.

## 2017-04-21 ENCOUNTER — Other Ambulatory Visit: Payer: Self-pay | Admitting: Family

## 2017-04-21 ENCOUNTER — Encounter: Payer: Self-pay | Admitting: Family

## 2017-04-21 ENCOUNTER — Ambulatory Visit (INDEPENDENT_AMBULATORY_CARE_PROVIDER_SITE_OTHER): Payer: 59 | Admitting: Family

## 2017-04-21 VITALS — BP 114/64 | HR 98 | Temp 98.4°F | Resp 18 | Ht 62.5 in | Wt 202.0 lb

## 2017-04-21 DIAGNOSIS — E669 Obesity, unspecified: Secondary | ICD-10-CM

## 2017-04-21 DIAGNOSIS — E1142 Type 2 diabetes mellitus with diabetic polyneuropathy: Secondary | ICD-10-CM | POA: Diagnosis not present

## 2017-04-21 DIAGNOSIS — E1169 Type 2 diabetes mellitus with other specified complication: Secondary | ICD-10-CM

## 2017-04-21 DIAGNOSIS — J9611 Chronic respiratory failure with hypoxia: Secondary | ICD-10-CM

## 2017-04-21 DIAGNOSIS — S93402A Sprain of unspecified ligament of left ankle, initial encounter: Secondary | ICD-10-CM | POA: Insufficient documentation

## 2017-04-21 DIAGNOSIS — S93492D Sprain of other ligament of left ankle, subsequent encounter: Secondary | ICD-10-CM | POA: Diagnosis not present

## 2017-04-21 MED ORDER — TRAMADOL HCL 50 MG PO TABS: ORAL_TABLET | ORAL | 0 refills | Status: DC

## 2017-04-21 MED ORDER — GABAPENTIN 300 MG PO CAPS: ORAL_CAPSULE | ORAL | 2 refills | Status: DC

## 2017-04-21 MED ORDER — PREGABALIN 50 MG PO CAPS: 100.0000 mg | ORAL_CAPSULE | Freq: Three times a day (TID) | ORAL | 0 refills | Status: DC

## 2017-04-21 NOTE — Assessment & Plan Note (Signed)
Type 2 diabetes appears adequately controlled with current medication regimen despite multiple rounds of high-dose steroids. Most recent A1c of 6.2. No new symptoms of end organ damage noted on physical exam. Continue current dosage of Lantus and Humalog. Encouraged to continue monitoring blood sugars at home. Diabetic foot exam is deferred secondary to ankle injury. Diabetic neuropathy appears adequately controlled with gabapentin and Lyrica. Not currently maintained on statin for CAD risk reduction. Pneumovax is up-to-date. Patient declines urine microalbumin today.

## 2017-04-21 NOTE — Assessment & Plan Note (Signed)
Stable with no current exacerbation maintained on continuous oxygen at 4 L via nasal cannula. Continue current dosage of albuterol, Daliresp, Spiriva, and Symbicort. Follow-up and changes per pulmonology. Handicap placard signed secondary to respiratory issues.

## 2017-04-21 NOTE — Telephone Encounter (Signed)
Pt is scheduled to see TP tomorrow. Will keep message open to be sure she keeps f/u.

## 2017-04-21 NOTE — Progress Notes (Signed)
Subjective:    Patient ID: Mary [name], female    DOB: 10/03/1976, 41 y.o.   MRN: [id number]  Chief Complaint  Patient presents with  . Follow-up    handicap placard    HPI:  Mary [name] is a 41 y.o. female who  has a past medical history of Axillary adenopathy; Chronic right-sided CHF (congestive heart failure) (HCC) (03/23/2012); COPD (chronic obstructive pulmonary disease) (HCC); DVT of upper extremity (deep vein thrombosis) [facility]) (April 2013); Exertional shortness of breath; Hepatic cirrhosis (HCC); History of alcoholism (HCC); History of chronic bronchitis; Irregular menses; Migraine; Moderate to severe pulmonary hypertension [facility]) (April 2013); On home oxygen therapy; OSA (obstructive sleep apnea); Periodontal disease; Pneumonia (~ 1985; 01/2011); Pulmonary embolus [facility]) (April 2013); and Pulmonary hypertension (HCC). and presents today for an a follow up office visit.   1.) Type 2 diabetes - Currently maintained Lantus and Humalog. Reports taking the medication as prescribed and denies adverse side effects. She is currently maintained on prednisone and when she is on high dose steroids notes her blood sugars to be elevated. No recent low blood sugar readings. Working on [employer], [employer]. Does continue to experience diabetic neuropathy that is managed by Lyrica and gabapentin whic hdo help with her pain.   Lab Results  Component Value Date   HGBA1C 6.2 (H) 03/25/2017    2.) Chronic respiratory failure with hypoxia - Currently maintained on Albuterol, Symbicort, Daliresp, and Spiriva. Reports taking medications as prescribed and denies adverse side effects. She is also on continuous oxygen at 4 L via nasal cannula. Has had several hospitalizations recently secondary to exacerbations of hypoxia. Denies current exacerbations with occasional shortness of breath. Requesting handicap placard nightly COPD severity.  3.) Ankle pain - This is a new problem. Associated symptom of pain located  in her left ankle has been going on for approximately 24 hours following a fall where she describes landing on her left ankle following exerting herself while not on oxygen and got lightheaded. She was seen in the ED and x-rays of the ankle were found to be negative. Course of the symptoms have worsened since initial onset and now has significant edema and discoloration. Modifying factors include ice and elevation.   No Known Allergies    Outpatient Medications Prior to Visit  Medication Sig Dispense Refill  . albuterol (PROVENTIL HFA;VENTOLIN HFA) 108 (90 BASE) MCG/ACT inhaler Inhale 2 puffs into the lungs every 6 (six) hours as needed for wheezing or shortness of breath. 8.5 g 1  . budesonide-formoterol (SYMBICORT) 160-4.5 MCG/ACT inhaler Inhale 2 puffs into the lungs 2 (two) times daily. 1 Inhaler 12  . guaiFENesin (MUCINEX) 600 MG 12 hr tablet Take 1 tablet (600 mg total) by mouth 2 (two) times daily. 40 tablet 0  . HYDROcodone-acetaminophen (NORCO/VICODIN) 5-325 MG tablet Take 1-2 tablets by mouth every 6 (six) hours as needed. 6 tablet 0  . insulin glargine (LANTUS) 100 UNIT/ML injection Inject 0.25 mLs (25 Units total) into the skin daily.    . insulin lispro (HUMALOG) 100 UNIT/ML injection Inject 0-0.15 mLs (0-15 Units total) into the skin 3 (three) times daily with meals. CBG < 70: Eat or drink something right away and recheck. CBG 70 - 120: 0 units CBG 121 - 150: 2 units CBG 151 - 200: 3 units CBG 201 - 250: 5 units CBG 251 - 300: 8 units CBG 301 - 350: 11 units CBG 351 - 400: 15 units CBG > 400: call MD.    .  pantoprazole (PROTONIX) 40 MG tablet Take 1 tablet (40 mg total) by mouth 2 (two) times daily before a meal. (Patient taking differently: Take 40 mg by mouth daily. ) 60 tablet 0  . potassium chloride SA (K-DUR,KLOR-CON) 20 MEQ tablet Take 1 tablet (20 mEq total) by mouth daily. 30 tablet 6  . predniSONE (DELTASONE) 5 MG tablet Resume 5 mg daily after you have completed the  10mg  tab taper    . roflumilast (DALIRESP) 500 MCG TABS tablet Take 500 mcg by mouth daily.    . Tiotropium Bromide Monohydrate (SPIRIVA RESPIMAT) 1.25 MCG/ACT AERS Inhale 2 puffs into the lungs daily. 1 Inhaler 3  . torsemide (DEMADEX) 20 MG tablet Take 1 tablet (20 mg total) by mouth daily. 90 tablet 0  . gabapentin (NEURONTIN) 300 MG capsule TAKE 2 CAPSULES BY MOUTH 3 TIMES DAILY. 180 capsule 2  . LYRICA 50 MG capsule TAKE 1 CAPSULE BY MOUTH 3 TIMES DAILY 90 capsule 1  . predniSONE (DELTASONE) 20 MG tablet Take 2 tablets (40 mg total) by mouth daily. 8 tablet 0  . traMADol (ULTRAM) 50 MG tablet TAKE 1 TABLET EVERY 6 HOURS AS NEEDED FOR SEVERE PAIN 120 tablet 0   No facility-administered medications prior to visit.       Past Surgical History:  Procedure Laterality Date  . APPENDECTOMY  05/03/2013  . CARDIAC CATHETERIZATION  03/2012  . CARDIAC CATHETERIZATION N/A 01/08/2016   Procedure: Right Heart Cath;  Surgeon: Dolores Patty, MD;  Location: [facility] INVASIVE CV LAB;  Service: Cardiovascular;  Laterality: N/A;  . CARDIAC SURGERY  September 01, 1976   "my heart was backwards" (05/03/2013)  . LAPAROSCOPIC APPENDECTOMY N/A 05/03/2013   Procedure: APPENDECTOMY LAPAROSCOPIC;  Surgeon: Cherylynn Ridges, MD;  Location: [facility] OR;  Service: General;  Laterality: N/A;  . LUNG SURGERY  11-08-1976  . RIGHT HEART CATHETERIZATION N/A 03/07/2012   Procedure: RIGHT HEART CATH;  Surgeon: Kathleene Hazel, MD;  Location: [facility] CATH LAB;  Service: Cardiovascular;  Laterality: N/A;      Past Medical History:  Diagnosis Date  . Axillary adenopathy    right axillary adenopathy noted on CT chest (03/06/2012)  . Chronic right-sided CHF (congestive heart failure) (HCC) 03/23/2012   with cor pulmonale. Last RHC 03/2012  . COPD (chronic obstructive pulmonary disease) (HCC)    PFTS 03/08/12: fev1 0.58L/1%, FVC 1.18/33%, Ratop 49 and c/w ssevere obstruction. 21% BD response on FVC, RV 219%, DLCO 11/54%  . DVT of upper  extremity (deep vein thrombosis) [facility]) April 2013   right subclavian // Unclear precipitating cause - possibly significant right heart failure, with vascular stasis potentially predisposing to clotting.    . Exertional shortness of breath   . Hepatic cirrhosis (HCC)    Questionable history of - Noted on CT abdomen (03/2012) - thought to be due to vascular congestion from right heart failure +/- patient's history of alcohol abuse  . History of alcoholism (HCC)   . History of chronic bronchitis   . Irregular menses   . Migraine    "~ 1/yr" (05/03/2013)  . Moderate to severe pulmonary hypertension [facility]) April 2013   Cardiac cath on 03/06/12 - 1. Elevated pulmonary artery pressures, right sided filling pressures.,  2. PA: 64/45 (mean 53)    . On home oxygen therapy    "2-3 L 24/7" (05/03/2013)  . OSA (obstructive sleep apnea)    wears noctural BiPAP (05/03/2013)  . Periodontal disease   . Pneumonia ~ 1985; 01/2011  . Pulmonary  embolus [facility](HCC) April 2013   Precipitating cause unclear. Was treated with coumadin from April-June 2013.  . Pulmonary hypertension (HCC)       Review of Systems  Constitutional: Negative for chills and fever.  HENT: Negative for congestion.   Respiratory: Positive for shortness of breath and wheezing. Negative for chest tightness.   Cardiovascular: Negative for chest pain, palpitations and leg swelling.  Musculoskeletal:       Positive for left ankle pain.   Neurological: Negative for dizziness, syncope, weakness and light-headedness.      Objective:    BP 114/64 (BP Location: Left Arm, Patient Position: Sitting, Cuff Size: Large)   Pulse 98   Temp 98.4 F (36.9 C) (Oral)   Resp 18   Ht 5' 2.5" (1.588 m)   Wt 202 lb (91.6 kg)   SpO2 91%   BMI 36.36 kg/m  Nursing note and vital signs reviewed.  Physical Exam  Constitutional: She is oriented to person, place, and time. She appears well-developed and well-nourished. No distress.  Cardiovascular: Normal  rate, regular rhythm, normal heart sounds and intact distal pulses.   Pulmonary/Chest: Effort normal. No respiratory distress. She has wheezes. She has no rales. She exhibits no tenderness.  Musculoskeletal:  Left ankle - No obvious deformity with significant edema and discoloration. There is generalized tenderness especially over the lateral ankle and anterior talofibular ligament. Range of motion decreased secondary to edema in all directions. Pulses and sensation are intact and appropriate.  Neurological: She is alert and oriented to person, place, and time.  Skin: Skin is warm and dry.  Psychiatric: She has a normal mood and affect. Her behavior is normal. Judgment and thought content normal.       Assessment & Plan:   Problem List Items Addressed This Visit      Respiratory   Chronic respiratory failure with hypoxia (HCC) - Primary    Stable with no current exacerbation maintained on continuous oxygen at 4 L via nasal cannula. Continue current dosage of albuterol, Daliresp, Spiriva, and Symbicort. Follow-up and changes per pulmonology. Handicap placard signed secondary to respiratory issues.        Endocrine   Diabetes mellitus type 2 in obese (HCC)    Type 2 diabetes appears adequately controlled with current medication regimen despite multiple rounds of high-dose steroids. Most recent A1c of 6.2. No new symptoms of end organ damage noted on physical exam. Continue current dosage of Lantus and Humalog. Encouraged to continue monitoring blood sugars at home. Diabetic foot exam is deferred secondary to ankle injury. Diabetic neuropathy appears adequately controlled with gabapentin and Lyrica. Not currently maintained on statin for CAD risk reduction. Pneumovax is up-to-date. Patient declines urine microalbumin today.      Diabetic neuropathy (HCC)   Relevant Medications   traMADol (ULTRAM) 50 MG tablet   gabapentin (NEURONTIN) 300 MG capsule     Musculoskeletal and Integument    Left ankle sprain    Symptoms and exam consistent with lateral ankle sprain secondary to trauma with previous x-rays being negative. Continue with conservative treatment of ice, elevation, and home exercise therapy. Placed an Aircast for support. Follow-up if symptoms worsen or do not improve.          I have discontinued Ms. Pleas PatriciaKelley's LYRICA. I am also having her start on pregabalin. Additionally, I am having her maintain her albuterol, potassium chloride SA, roflumilast, pantoprazole, torsemide, guaiFENesin, budesonide-formoterol, predniSONE, insulin lispro, insulin glargine, Tiotropium Bromide Monohydrate, HYDROcodone-acetaminophen, traMADol, and gabapentin.  Meds ordered this encounter  Medications  . traMADol (ULTRAM) 50 MG tablet    Sig: TAKE 1 TABLET EVERY 6 HOURS AS NEEDED FOR SEVERE PAIN    Dispense:  120 tablet    Refill:  0    Order Specific Question:   Supervising Provider    Answer:   Hillard Danker A [[id number]]  . gabapentin (NEURONTIN) 300 MG capsule    Sig: TAKE 2 CAPSULES BY MOUTH 3 TIMES DAILY.    Dispense:  180 capsule    Refill:  2    Order Specific Question:   Supervising Provider    Answer:   Hillard Danker A [[id number]]  . pregabalin (LYRICA) 50 MG capsule    Sig: Take 2 capsules (100 mg total) by mouth 3 (three) times daily.    Dispense:  180 capsule    Refill:  0    Order Specific Question:   Supervising Provider    Answer:   Hillard Danker A [[id number]]     Follow-up: Return in about 3 months (around 07/22/2017).  Jeanine Luz, FNP

## 2017-04-21 NOTE — Patient Instructions (Signed)
Thank you for choosing [facility].  SUMMARY AND INSTRUCTIONS:  Please continue to take your medications as prescribed.  Follow up in 3 months or sooner if needed.   For your ankle - Ice x 20 minutes every 2 hours and after activity.  Ankle exercises daily.   Medication:  Your prescription(s) have been submitted to your pharmacy or been printed and provided for you. Please take as directed and contact our office if you believe you are having problem(s) with the medication(s) or have any questions.  Follow up:  If your symptoms worsen or fail to improve, please contact our office for further instruction, or in case of emergency go directly to the emergency room at the closest medical facility.    Ankle Sprain, Phase I Rehab Ask your health care provider which exercises are safe for you. Do exercises exactly as told by your health care provider and adjust them as directed. It is normal to feel mild stretching, pulling, tightness, or discomfort as you do these exercises, but you should stop right away if you feel sudden pain or your pain gets worse.Do not begin these exercises until told by your health care provider. Stretching and range of motion exercises These exercises warm up your muscles and joints and improve the movement and flexibility of your lower leg and ankle. These exercises also help to relieve pain and stiffness. Exercise A: Gastroc and soleus stretch   1. Sit on the floor with your left / right leg extended. 2. Loop a belt or towel around the ball of your left / right foot. The ball of your foot is on the walking surface, right under your toes. 3. Keep your left / right ankle and foot relaxed and keep your knee straight while you use the belt or towel to pull your foot toward you. You should feel a gentle stretch behind your calf or knee. 4. Hold this position for __________ seconds, then release to the starting position. Repeat the exercise with your knee bent.  You can put a pillow or a rolled bath towel under your knee to support it. You should feel a stretch deep in your calf or at your Achilles tendon. Repeat each stretch __________ times. Complete these stretches __________ times a day. Exercise B: Ankle alphabet   1. Sit with your left / right leg supported at the lower leg.  Do not rest your foot on anything.  Make sure your foot has room to move freely. 2. Think of your left / right foot as a paintbrush, and move your foot to trace each letter of the alphabet in the air. Keep your hip and knee still while you trace. Make the letters as large as you can without feeling discomfort. 3. Trace every letter from A to Z. Repeat __________ times. Complete this exercise __________ times a day. Strengthening exercises These exercises build strength and endurance in your ankle and lower leg. Endurance is the ability to use your muscles for a long time, even after they get tired. Exercise C: Dorsiflexors   1. Secure a rubber exercise band or tube to an object, such as a table leg, that will stay still when the band is pulled. Secure the other end around your left / right foot. 2. Sit on the floor facing the object, with your left / right leg extended. The band or tube should be slightly tense when your foot is relaxed. 3. Slowly bring your foot toward you, pulling the band tighter. 4. Hold  this position for __________ seconds. 5. Slowly return your foot to the starting position. Repeat __________ times. Complete this exercise __________ times a day. Exercise D: Plantar flexors   1. Sit on the floor with your left / right leg extended. 2. Loop a rubber exercise tube or band around the ball of your left / right foot. The ball of your foot is on the walking surface, right under your toes.  Hold the ends of the band or tube in your hands.  The band or tube should be slightly tense when your foot is relaxed. 3. Slowly point your foot and toes downward,  pushing them away from you. 4. Hold this position for __________ seconds. 5. Slowly return your foot to the starting position. Repeat __________ times. Complete this exercise __________ times a day. Exercise E: Evertors  1. Sit on the floor with your legs straight out in front of you. 2. Loop a rubber exercise band or tube around the ball of your left / right foot. The ball of your foot is on the walking surface, right under your toes.  Hold the ends of the band in your hands, or secure the band to a stable object.  The band or tube should be slightly tense when your foot is relaxed. 3. Slowly push your foot outward, away from your other leg. 4. Hold this position for __________ seconds. 5. Slowly return your foot to the starting position. Repeat __________ times. Complete this exercise __________ times a day. This information is not intended to replace advice given to you by your health care provider. Make sure you discuss any questions you have with your health care provider. Document Released: 06/23/2005 Document Revised: 07/29/2016 Document Reviewed: 10/06/2015 Elsevier Interactive Patient Education  2017 [employer].

## 2017-04-21 NOTE — Assessment & Plan Note (Signed)
Symptoms and exam consistent with lateral ankle sprain secondary to trauma with previous x-rays being negative. Continue with conservative treatment of ice, elevation, and home exercise therapy. Placed an Aircast for support. Follow-up if symptoms worsen or do not improve.

## 2017-04-22 ENCOUNTER — Ambulatory Visit (INDEPENDENT_AMBULATORY_CARE_PROVIDER_SITE_OTHER): Payer: 59 | Admitting: Adult Health

## 2017-04-22 ENCOUNTER — Other Ambulatory Visit (INDEPENDENT_AMBULATORY_CARE_PROVIDER_SITE_OTHER): Payer: 59

## 2017-04-22 ENCOUNTER — Encounter: Payer: Self-pay | Admitting: Adult Health

## 2017-04-22 VITALS — BP 96/68 | HR 99

## 2017-04-22 DIAGNOSIS — J441 Chronic obstructive pulmonary disease with (acute) exacerbation: Secondary | ICD-10-CM

## 2017-04-22 DIAGNOSIS — G4733 Obstructive sleep apnea (adult) (pediatric): Secondary | ICD-10-CM

## 2017-04-22 DIAGNOSIS — I272 Pulmonary hypertension, unspecified: Secondary | ICD-10-CM

## 2017-04-22 DIAGNOSIS — J9611 Chronic respiratory failure with hypoxia: Secondary | ICD-10-CM | POA: Diagnosis not present

## 2017-04-22 LAB — BASIC METABOLIC PANEL
BUN: 14 mg/dL (ref 6–23)
CO2: 48 mEq/L — ABNORMAL HIGH (ref 19–32)
Calcium: 9.3 mg/dL (ref 8.4–10.5)
Chloride: 90 mEq/L — ABNORMAL LOW (ref 96–112)
Creatinine, Ser: 0.54 mg/dL (ref 0.40–1.20)
GFR: 133.22 mL/min (ref >60.00)
Glucose, Bld: 165 mg/dL — ABNORMAL HIGH (ref 70–99)
Potassium: 3.7 mEq/L (ref 3.5–5.1)
Sodium: 141 mEq/L (ref 135–145)

## 2017-04-22 LAB — BRAIN NATRIURETIC PEPTIDE: Pro B Natriuretic peptide (BNP): 129 pg/mL — ABNORMAL HIGH (ref 0.0–100.0)

## 2017-04-22 NOTE — Assessment & Plan Note (Signed)
Very severe COPD with frequent exacerbation  She has a very low FEV1 with associated COPD/Ashtma . CT showed no ILD/bronchiectasis . Alpha one was nml . 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  She had little OSA on Sleep study in 2013 . Will adjust BIPAP with auto titration to see if helps with hypercarbia . Suspect she need noninvasive vent support -might help with severe hypercarbia and frequent decompensations  Referral in 2017 to [facility] , not transplant candidate due to wt , severity of dz , smoking .  Discussed case with Dr. Marchelle Gearingamaswamy and Dr. Craige CottaSood  .  Check BMET today  Adjust BIPAP w/ autotitration  Will set up with sleep doctor to help with evaluation of non invasive vent support  [facility] need [facility] COPD /Resp failure given age, severity and poor control . Will discuss on return

## 2017-04-22 NOTE — Patient Instructions (Addendum)
Continue on Symbicort  and Spiriva .  Daliresp daily .  Continue on Oxygen 4l/m  Change BIPAP setting , [facility] .  Labs today .  Set up ONO on BPAP with oxygen.  Continue on BIPAP At bedtime with oxygen . And with naps.  Follow up Dr. Marchelle Gearingamaswamy in 4 weeks and As needed  With BIPAP download.  Please contact office for sooner follow up if symptoms do not improve or worsen or seek emergency care

## 2017-04-22 NOTE — Assessment & Plan Note (Signed)
Cont on O2 .  Cont on BIPAP At bedtime   Adjust BIPAP to auto set  Download on new setting  Check bmet  Refer to sleep doctor - ? Noninvasive vent

## 2017-04-22 NOTE — Assessment & Plan Note (Signed)
OSA on BIPAP - will need download , requested Persistent hypercarbia - will change to auto set , check download  Check bmet today -look at hco3.  Refer to sleep to help with control ? Need nocturnal noninvasive vent support .

## 2017-04-22 NOTE — Progress Notes (Signed)
@Patient  ID: Mary [name], female    DOB: [birth year], 41 y.o.   MRN: [id number]  Chief Complaint  Patient presents with  . Follow-up    COPD     Referring provider: Golden Circle, FNP  HPI: 41 year old female smoker followed for very severe gold 4 COPD, compensated by cor pulmonale and chronic hypoxic and hypercarbic respiratory failure on BIPAP /O2 .   She was born 32 month premie - on vent x 6 months at birth.   TEST [facility]  Primary function test May 2013 FEV1 24%, ratio 48, positive bronchodilator response with a 31% change, FVC 42% DLCO 32% 2013 Alpha 1 MM 340  HRCT Chest 01/2017 >no ILD , RUL nodule -ggo 2.1cm  stable since 2016 (slightly enlarged from 2015) will need CT chest in 1 year. Parenchymal scarring .  Echo EF 65%, Gr 1 DD , PAP 43mHg.  Sleep study 03/2012 >[facility] , O2 desats 73% Referral to [facility] for COPD eval /Transplant eval 02/2016  04/22/2017 Follow up : COPD exacerbation  Pt returns for a post hospital follow up . She was admitted 4/19 for COPD exacerbation , acute on chronic hypercarbic/hypoxic resp failure . She was treated with IV steroids and abx. She did have her BIPAP settings changed to 18/5 with 4l/m bleed in . PCO2 was trend over last year has been 70-90 on ABG s.  She smokes on occasion, discussed on cessation.  Since discharge she continues to feel bad, gets winded with activity . No energy .  Says she felt better at first but sx have declined over last 2 weeks . No flare of cough, wheezing or edema. Gets winded easily and has not energy.   Has had multiple admissions over last 6 months .  Went to ER on 5/16 for left ankle sprain . XRay neg for fx.  She took her oxygen off to do something and oxygen dropped very low.  She got weak and fell down . She did not pass out but felt faintish.  ABG on 5/16 showed PCO2 92, pH 7.3 , Po2 85.  She says she is wearing BIPAP  And does not miss a night.  Download was requested.    No Known  Allergies  Immunization History  Administered Date(s) Administered  . Influenza Split 01/06/2011, 10/20/2012, 09/06/2013  . Influenza, High Dose Seasonal PF 07/29/2016  . Influenza,inj,Quad PF,36+ Mos 09/27/2014, 09/16/2015  . Pneumococcal Conjugate-13 09/16/2015  . Pneumococcal Polysaccharide-23 01/06/2011  . Tdap 09/06/2013    Past Medical History:  Diagnosis Date  . Axillary adenopathy    right axillary adenopathy noted on CT chest (03/06/2012)  . Chronic right-sided CHF (congestive heart failure) ([place] 03/23/2012   with cor pulmonale. Last RHC 03/2012  . COPD (chronic obstructive pulmonary disease) ([place]    PFTS 03/08/12: fev1 0.58L/1%, FVC 1.18/33%, Ratop 49 and c/w ssevere obstruction. 21% BD response on FVC, RV 219%, DLCO 11/54%  . DVT of upper extremity (deep vein thrombosis) ([facility] April 2013   right subclavian // Unclear precipitating cause - possibly significant right heart failure, with vascular stasis potentially predisposing to clotting.    . Exertional shortness of breath   . Hepatic cirrhosis ([facility]    Questionable history of - Noted on CT abdomen (03/2012) - thought to be due to vascular congestion from right heart failure +/- patient's history of alcohol abuse  . [place] of alcoholism ([place]   . History of chronic bronchitis   . Irregular menses   .  Migraine    "~ 1/yr" (05/03/2013)  . Moderate to severe pulmonary hypertension [facility]) April 2013   Cardiac cath on 03/06/12 - 1. Elevated pulmonary artery pressures, right sided filling pressures.,  2. PA: 64/45 (mean 53)    . On home oxygen therapy    "2-3 L 24/7" (05/03/2013)  . OSA (obstructive sleep apnea)    wears noctural BiPAP (05/03/2013)  . Periodontal disease   . Pneumonia ~ 1985; 01/2011  . Pulmonary embolus [facility]) April 2013   Precipitating cause unclear. Was treated with coumadin from April-June 2013.  . Pulmonary hypertension ([place])     Tobacco History: History  Smoking Status  . Current Some Day Smoker  .  Packs/day: 1.00  . Years: 25.00  . Types: Cigarettes  . Last attempt to quit: 02/18/2012  Smokeless Tobacco  . Never Used    Comment: smokes 1-2 cigs per day (03/14/17)   Ready to quit: Not Answered Counseling given: Not Answered   Outpatient Encounter Prescriptions as of 04/22/2017  Medication Sig  . albuterol (PROVENTIL HFA;VENTOLIN HFA) 108 (90 BASE) MCG/ACT inhaler Inhale 2 puffs into the lungs every 6 (six) hours as needed for wheezing or shortness of breath.  . Blood Glucose Monitoring Suppl (ONE TOUCH ULTRA 2) w/Device KIT USE TO CHECK BLOOD SUGARS TWICE A DAY  . budesonide-formoterol (SYMBICORT) 160-4.5 MCG/ACT inhaler Inhale 2 puffs into the lungs 2 (two) times daily.  Marland Kitchen gabapentin (NEURONTIN) 300 MG capsule TAKE 2 CAPSULES BY MOUTH 3 TIMES DAILY.  Marland Kitchen guaiFENesin (MUCINEX) 600 MG 12 hr tablet Take 1 tablet (600 mg total) by mouth 2 (two) times daily.  Marland Kitchen HYDROcodone-acetaminophen (NORCO/VICODIN) 5-325 MG tablet Take 1-2 tablets by mouth every 6 (six) hours as needed.  . insulin glargine (LANTUS) 100 UNIT/ML injection Inject 0.25 mLs (25 Units total) into the skin daily.  . insulin lispro (HUMALOG) 100 UNIT/ML injection Inject 0-0.15 mLs (0-15 Units total) into the skin 3 (three) times daily with meals. CBG < 70: Eat or drink something right away and recheck. CBG 70 - 120: 0 units CBG 121 - 150: 2 units CBG 151 - 200: 3 units CBG 201 - 250: 5 units CBG 251 - 300: 8 units CBG 301 - 350: 11 units CBG 351 - 400: 15 units CBG > 400: call MD.  . pantoprazole (PROTONIX) 40 MG tablet Take 1 tablet (40 mg total) by mouth 2 (two) times daily before a meal. (Patient taking differently: Take 40 mg by mouth daily. )  . potassium chloride SA (K-DUR,KLOR-CON) 20 MEQ tablet Take 1 tablet (20 mEq total) by mouth daily.  . predniSONE (DELTASONE) 5 MG tablet Resume 5 mg daily after you have completed the 65m tab taper  . pregabalin (LYRICA) 50 MG capsule Take 2 capsules (100 mg total) by mouth 3  (three) times daily.  . roflumilast (DALIRESP) 500 MCG TABS tablet Take 500 mcg by mouth daily.  . Tiotropium Bromide Monohydrate (SPIRIVA RESPIMAT) 1.25 MCG/ACT AERS Inhale 2 puffs into the lungs daily.  .Marland Kitchentorsemide (DEMADEX) 20 MG tablet Take 1 tablet (20 mg total) by mouth daily.  . traMADol (ULTRAM) 50 MG tablet TAKE 1 TABLET EVERY 6 HOURS AS NEEDED FOR SEVERE PAIN   No facility-administered encounter medications on file as of 04/22/2017.      Review of Systems  Constitutional:   No  weight loss, night sweats,  Fevers, chills, + fatigue, or  lassitude.  HEENT:   No headaches,  Difficulty swallowing,  Tooth/dental problems, or  Sore throat,                No sneezing, itching, ear ache, + nasal congestion, post nasal drip,   CV:  No chest pain,  Orthopnea, PND, , anasarca, dizziness, palpitations, syncope.   GI  No heartburn, indigestion, abdominal pain, nausea, vomiting, diarrhea, change in bowel habits, loss of appetite, bloody stools.   Resp .  No chest wall deformity  Skin: no rash or lesions.  GU: no dysuria, change in color of urine, no urgency or frequency.  No flank pain, no hematuria   MS:  No joint pain or swelling.  No decreased range of motion.  No back pain.    Physical Exam  BP 96/68 (BP Location: Left Arm, Cuff Size: Normal)   Pulse 99   SpO2 91%   GEN: A/Ox3; pleasant , NAD, obese on O2    HEENT:  [place]/AT,  EACs-clear, TMs-wnl, NOSE-clear, THROAT-clear, no lesions, no postnasal drip or exudate noted.   NECK:  Supple w/ fair ROM; no JVD; normal carotid impulses w/o bruits; no thyromegaly or nodules palpated; no lymphadenopathy.    RESP  Clear  P & A; w/o, wheezes/ rales/ or rhonchi. no accessory muscle use, no dullness to percussion  CARD:  RRR, no m/r/g, tr -1  peripheral edema, pulses intact, no cyanosis or clubbing.  GI:   Soft & nt; nml bowel sounds; no organomegaly or masses detected.   Musco: Warm bil, no deformities or joint swelling noted.   Left ankle /foot swelling /bruising .   Neuro: alert, no focal deficits noted.    Skin: Warm, no lesions or rashes    Lab Results:  CBC  BNP  Imaging: Dg Chest 2 View  Result Date: 03/24/2017 CLINICAL DATA:  Weakness, cough and shortness of breath. EXAM: CHEST  2 VIEW COMPARISON:  Chest x-ray 03/14/2017 FINDINGS: The heart is mildly enlarged but stable. Prominent left pulmonary hilum due to prominent pulmonary arteries. Some persistent left lower lobe atelectasis or infiltrate. No pleural effusion. The bony thorax is intact. IMPRESSION: Residual streaky left basilar atelectasis or resolving infiltrate. No pleural effusion. Electronically Signed   By: Marijo Sanes M.D.   On: 03/24/2017 15:00   Dg Ankle Complete Left  Result Date: 04/20/2017 CLINICAL DATA:  Fall with ankle swelling EXAM: LEFT ANKLE COMPLETE - 3+ VIEW COMPARISON:  None. FINDINGS: There is no evidence of fracture, dislocation, or joint effusion. There is no evidence of arthropathy or other focal bone abnormality. Moderate soft tissue swelling circumferentially. IMPRESSION: Circumferential left ankle soft tissue swelling without fracture or dislocation. Electronically Signed   By: Ulyses Jarred M.D.   On: 04/20/2017 00:46   Dg Chest Portable 1 View  Result Date: 04/20/2017 CLINICAL DATA:  Shortness of breath. EXAM: PORTABLE CHEST 1 VIEW COMPARISON:  Radiograph 03/24/2017.  Chest CT 02/01/2017 FINDINGS: Unchanged heart size and mediastinal contours. The lungs are hyperinflated. There is increased peribronchial thickening from prior. Streaky bibasilar opacities, left lung base not well visualized due to overlapping soft tissue structures. No large pleural effusion or pneumothorax. Grossly stable osseous structures. IMPRESSION: Increased peribronchial thickening, may be infectious or inflammatory, less likely pulmonary edema. Streaky bibasilar opacities. The left lung base is only assessed due to overlapping soft tissue structures.  PA and lateral views may be helpful when patient is able. Electronically Signed   By: Jeb Levering M.D.   On: 04/20/2017 00:33     Assessment & Plan:   COPD exacerbation ([place]) Very severe  COPD with frequent exacerbation  She has a very low FEV1 with associated COPD/Ashtma . CT showed no ILD/bronchiectasis . Alpha one was nml . 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  She had little OSA on Sleep study in 2013 . Will adjust BIPAP with auto titration to see if helps with hypercarbia . Suspect she need noninvasive vent support -might help with severe hypercarbia and frequent decompensations  Referral in 2017 to [facility] , not transplant candidate due to wt , severity of dz , smoking .  Discussed case with Dr. Chase Caller and Dr. Halford Chessman  .  Check BMET today  Adjust BIPAP w/ autotitration  Will set up with sleep doctor to help with evaluation of non invasive vent support  May need to consider referral to [facility] for COPD /Resp failure given age, severity and poor control . Will discuss on return    OSA on BiPAP OSA on BIPAP - will need download , requested Persistent hypercarbia - will change to auto set , check download  Check bmet today -look at hco3.  Refer to sleep to help with control ? Need nocturnal noninvasive vent support .   Chronic respiratory failure with hypoxia (HCC) Cont on O2 .  Cont on BIPAP At bedtime   Adjust BIPAP to auto set  Download on new setting  Check bmet  Refer to sleep doctor - ? Noninvasive vent      Rexene Edison, NP 04/22/2017

## 2017-04-24 ENCOUNTER — Emergency Department (HOSPITAL_COMMUNITY): Payer: 59

## 2017-04-24 ENCOUNTER — Encounter (HOSPITAL_COMMUNITY): Payer: Self-pay | Admitting: Emergency Medicine

## 2017-04-24 ENCOUNTER — Inpatient Hospital Stay (HOSPITAL_COMMUNITY)
Admission: EM | Admit: 2017-04-24 | Discharge: 2017-04-27 | DRG: 190 | Disposition: A | Discharge status: Home Health | Payer: 59 | Attending: Nephrology | Admitting: Nephrology

## 2017-04-24 DIAGNOSIS — Z794 Long term (current) use of insulin: Secondary | ICD-10-CM

## 2017-04-24 DIAGNOSIS — F17210 Nicotine dependence, cigarettes, uncomplicated: Secondary | ICD-10-CM | POA: Diagnosis present

## 2017-04-24 DIAGNOSIS — Z811 Family history of alcohol abuse and dependence: Secondary | ICD-10-CM | POA: Diagnosis not present

## 2017-04-24 DIAGNOSIS — Z7952 Long term (current) use of systemic steroids: Secondary | ICD-10-CM

## 2017-04-24 DIAGNOSIS — I50812 Chronic right heart failure: Secondary | ICD-10-CM | POA: Diagnosis not present

## 2017-04-24 DIAGNOSIS — I2729 Other secondary pulmonary hypertension: Secondary | ICD-10-CM | POA: Diagnosis present

## 2017-04-24 DIAGNOSIS — R079 Chest pain, unspecified: Secondary | ICD-10-CM | POA: Diagnosis not present

## 2017-04-24 DIAGNOSIS — R062 Wheezing: Secondary | ICD-10-CM | POA: Diagnosis not present

## 2017-04-24 DIAGNOSIS — J441 Chronic obstructive pulmonary disease with (acute) exacerbation: Principal | ICD-10-CM | POA: Diagnosis present

## 2017-04-24 DIAGNOSIS — G253 Myoclonus: Secondary | ICD-10-CM | POA: Diagnosis present

## 2017-04-24 DIAGNOSIS — J9611 Chronic respiratory failure with hypoxia: Secondary | ICD-10-CM | POA: Diagnosis not present

## 2017-04-24 DIAGNOSIS — I5032 Chronic diastolic (congestive) heart failure: Secondary | ICD-10-CM | POA: Diagnosis present

## 2017-04-24 DIAGNOSIS — Z7951 Long term (current) use of inhaled steroids: Secondary | ICD-10-CM

## 2017-04-24 DIAGNOSIS — E099 Drug or chemical induced diabetes mellitus without complications: Secondary | ICD-10-CM | POA: Diagnosis not present

## 2017-04-24 DIAGNOSIS — J9621 Acute and chronic respiratory failure with hypoxia: Secondary | ICD-10-CM | POA: Diagnosis not present

## 2017-04-24 DIAGNOSIS — G4733 Obstructive sleep apnea (adult) (pediatric): Secondary | ICD-10-CM | POA: Diagnosis present

## 2017-04-24 DIAGNOSIS — T380X5A Adverse effect of glucocorticoids and synthetic analogues, initial encounter: Secondary | ICD-10-CM | POA: Diagnosis not present

## 2017-04-24 DIAGNOSIS — I272 Pulmonary hypertension, unspecified: Secondary | ICD-10-CM | POA: Diagnosis present

## 2017-04-24 DIAGNOSIS — Z86711 Personal history of pulmonary embolism: Secondary | ICD-10-CM | POA: Diagnosis not present

## 2017-04-24 DIAGNOSIS — J9622 Acute and chronic respiratory failure with hypercapnia: Secondary | ICD-10-CM | POA: Diagnosis not present

## 2017-04-24 DIAGNOSIS — E876 Hypokalemia: Secondary | ICD-10-CM | POA: Diagnosis present

## 2017-04-24 DIAGNOSIS — E662 Morbid (severe) obesity with alveolar hypoventilation: Secondary | ICD-10-CM | POA: Diagnosis present

## 2017-04-24 DIAGNOSIS — R531 Weakness: Secondary | ICD-10-CM | POA: Diagnosis not present

## 2017-04-24 DIAGNOSIS — R404 Transient alteration of awareness: Secondary | ICD-10-CM | POA: Diagnosis not present

## 2017-04-24 DIAGNOSIS — Z6837 Body mass index (BMI) 37.0-37.9, adult: Secondary | ICD-10-CM | POA: Diagnosis not present

## 2017-04-24 DIAGNOSIS — Z8249 Family history of ischemic heart disease and other diseases of the circulatory system: Secondary | ICD-10-CM

## 2017-04-24 DIAGNOSIS — R05 Cough: Secondary | ICD-10-CM | POA: Diagnosis not present

## 2017-04-24 DIAGNOSIS — Z86718 Personal history of other venous thrombosis and embolism: Secondary | ICD-10-CM

## 2017-04-24 DIAGNOSIS — Z9981 Dependence on supplemental oxygen: Secondary | ICD-10-CM

## 2017-04-24 DIAGNOSIS — E0965 Drug or chemical induced diabetes mellitus with hyperglycemia: Secondary | ICD-10-CM | POA: Diagnosis present

## 2017-04-24 LAB — I-STAT ARTERIAL BLOOD GAS, ED
Acid-Base Excess: 20 mmol/L — ABNORMAL HIGH (ref 0.0–2.0)
Bicarbonate: 51.3 mmol/L — ABNORMAL HIGH (ref 20.0–28.0)
O2 Saturation: 86 %
TCO2: >50 mmol/L (ref 0–100)
pCO2 arterial: 84.6 mmHg (ref 32.0–48.0)
pH, Arterial: 7.391 (ref 7.350–7.450)
pO2, Arterial: 55 mmHg — ABNORMAL LOW (ref 83.0–108.0)

## 2017-04-24 LAB — URINALYSIS, ROUTINE W REFLEX MICROSCOPIC
Bilirubin Urine: NEGATIVE
Glucose, UA: >=500 mg/dL — AB
Hgb urine dipstick: NEGATIVE
Ketones, ur: 5 mg/dL — AB
Leukocytes, UA: NEGATIVE
Nitrite: NEGATIVE
Protein, ur: NEGATIVE mg/dL
SPECIFIC GRAVITY, URINE: 1.012 (ref 1.005–1.030)
pH: 5 (ref 5.0–8.0)

## 2017-04-24 LAB — COMPREHENSIVE METABOLIC PANEL
ALK PHOS: 115 U/L (ref 38–126)
ALT: 113 U/L — AB (ref 14–54)
AST: 72 U/L — ABNORMAL HIGH (ref 15–41)
Albumin: 3.6 g/dL (ref 3.5–5.0)
Anion gap: 9 (ref 5–15)
BUN: <5 mg/dL — ABNORMAL LOW (ref 6–20)
CALCIUM: 8.8 mg/dL — AB (ref 8.9–10.3)
CO2: 45 mmol/L — ABNORMAL HIGH (ref 22–32)
CREATININE: 0.56 mg/dL (ref 0.44–1.00)
Chloride: 85 mmol/L — ABNORMAL LOW (ref 101–111)
Glucose, Bld: 156 mg/dL — ABNORMAL HIGH (ref 65–99)
Potassium: 3.5 mmol/L (ref 3.5–5.1)
Sodium: 139 mmol/L (ref 135–145)
TOTAL PROTEIN: 6.5 g/dL (ref 6.5–8.1)
Total Bilirubin: 1.3 mg/dL — ABNORMAL HIGH (ref 0.3–1.2)

## 2017-04-24 LAB — CBC WITH DIFFERENTIAL/PLATELET
BASOS ABS: 0 10*3/uL (ref 0.0–0.1)
BASOS PCT: 0 %
EOS ABS: 0.1 10*3/uL (ref 0.0–0.7)
Eosinophils Relative: 1 %
HEMATOCRIT: 46.6 % — AB (ref 36.0–46.0)
HEMOGLOBIN: 14.1 g/dL (ref 12.0–15.0)
Lymphocytes Relative: 19 %
Lymphs Abs: 1.8 10*3/uL (ref 0.7–4.0)
MCH: 31.4 pg (ref 26.0–34.0)
MCHC: 30.3 g/dL (ref 30.0–36.0)
MCV: 103.8 fL — ABNORMAL HIGH (ref 78.0–100.0)
Monocytes Absolute: 0.9 10*3/uL (ref 0.1–1.0)
Monocytes Relative: 9 %
NEUTROS ABS: 6.7 10*3/uL (ref 1.7–7.7)
NEUTROS PCT: 71 %
Platelets: 132 10*3/uL — ABNORMAL LOW (ref 150–400)
RBC: 4.49 MIL/uL (ref 3.87–5.11)
RDW: 15 % (ref 11.5–15.5)
WBC: 9.6 10*3/uL (ref 4.0–10.5)

## 2017-04-24 LAB — CBG MONITORING, ED: Glucose-Capillary: 202 mg/dL — ABNORMAL HIGH (ref 65–99)

## 2017-04-24 LAB — I-STAT TROPONIN, ED: Troponin i, poc: 0 ng/mL (ref 0.00–0.08)

## 2017-04-24 LAB — PROCALCITONIN: Procalcitonin: 0.15 ng/mL

## 2017-04-24 LAB — GLUCOSE, CAPILLARY
GLUCOSE-CAPILLARY: 344 mg/dL — AB (ref 65–99)
Glucose-Capillary: 388 mg/dL — ABNORMAL HIGH (ref 65–99)

## 2017-04-24 LAB — I-STAT BETA HCG BLOOD, ED (MC, WL, AP ONLY): I-stat hCG, quantitative: <5 m[IU]/mL (ref <5)

## 2017-04-24 LAB — MAGNESIUM: MAGNESIUM: 1.8 mg/dL (ref 1.7–2.4)

## 2017-04-24 LAB — BRAIN NATRIURETIC PEPTIDE: B NATRIURETIC PEPTIDE 5: 234.4 pg/mL — AB (ref 0.0–100.0)

## 2017-04-24 MED ORDER — ALBUTEROL SULFATE (2.5 MG/3ML) 0.083% IN NEBU
2.5000 mg | INHALATION_SOLUTION | RESPIRATORY_TRACT | Status: DC | PRN
Start: 2017-04-24 — End: 2017-04-27

## 2017-04-24 MED ORDER — INSULIN GLARGINE 100 UNIT/ML ~~LOC~~ SOLN
25.0000 [IU] | Freq: Every day | SUBCUTANEOUS | Status: DC
  Administered 2017-04-24 – 2017-04-27 (×4): 25 [IU] via SUBCUTANEOUS
  Filled 2017-04-24 (×4): qty 0.25

## 2017-04-24 MED ORDER — INSULIN ASPART 100 UNIT/ML ~~LOC~~ SOLN
6.0000 [IU] | Freq: Three times a day (TID) | SUBCUTANEOUS | Status: DC
  Administered 2017-04-24 – 2017-04-27 (×10): 6 [IU] via SUBCUTANEOUS

## 2017-04-24 MED ORDER — PREGABALIN 100 MG PO CAPS
100.0000 mg | ORAL_CAPSULE | Freq: Three times a day (TID) | ORAL | Status: DC
  Administered 2017-04-24 – 2017-04-27 (×10): 100 mg via ORAL
  Filled 2017-04-24 (×10): qty 1

## 2017-04-24 MED ORDER — TRAMADOL HCL 50 MG PO TABS: 50.0000 mg | ORAL_TABLET | Freq: Four times a day (QID) | ORAL | Status: DC | PRN

## 2017-04-24 MED ORDER — GABAPENTIN 300 MG PO CAPS
600.0000 mg | ORAL_CAPSULE | Freq: Two times a day (BID) | ORAL | Status: DC
  Administered 2017-04-24 – 2017-04-27 (×7): 600 mg via ORAL
  Filled 2017-04-24 (×7): qty 2

## 2017-04-24 MED ORDER — ONDANSETRON HCL 4 MG/2ML IJ SOLN: 4.0000 mg | Freq: Four times a day (QID) | INTRAMUSCULAR | Status: DC | PRN

## 2017-04-24 MED ORDER — SENNOSIDES-DOCUSATE SODIUM 8.6-50 MG PO TABS: 1.0000 | ORAL_TABLET | Freq: Every evening | ORAL | Status: DC | PRN

## 2017-04-24 MED ORDER — ACETAMINOPHEN 650 MG RE SUPP: 650.0000 mg | Freq: Four times a day (QID) | RECTAL | Status: DC | PRN

## 2017-04-24 MED ORDER — BISACODYL 5 MG PO TBEC: 5.0000 mg | DELAYED_RELEASE_TABLET | Freq: Every day | ORAL | Status: DC | PRN

## 2017-04-24 MED ORDER — GUAIFENESIN ER 600 MG PO TB12
600.0000 mg | ORAL_TABLET | Freq: Two times a day (BID) | ORAL | Status: DC
  Administered 2017-04-24 (×2): 600 mg via ORAL
  Filled 2017-04-24 (×2): qty 1

## 2017-04-24 MED ORDER — ALBUTEROL SULFATE (2.5 MG/3ML) 0.083% IN NEBU: 5.0000 mg | INHALATION_SOLUTION | Freq: Once | RESPIRATORY_TRACT | Status: DC

## 2017-04-24 MED ORDER — MOMETASONE FURO-FORMOTEROL FUM 200-5 MCG/ACT IN AERO
2.0000 | INHALATION_SPRAY | Freq: Two times a day (BID) | RESPIRATORY_TRACT | Status: DC
  Filled 2017-04-24: qty 8.8

## 2017-04-24 MED ORDER — MAGNESIUM SULFATE 2 GM/50ML IV SOLN
2.0000 g | Freq: Once | INTRAVENOUS | Status: AC
  Administered 2017-04-24: 2 g via INTRAVENOUS
  Filled 2017-04-24: qty 50

## 2017-04-24 MED ORDER — PANTOPRAZOLE SODIUM 40 MG PO TBEC
40.0000 mg | DELAYED_RELEASE_TABLET | Freq: Two times a day (BID) | ORAL | Status: DC
  Administered 2017-04-24 – 2017-04-27 (×7): 40 mg via ORAL
  Filled 2017-04-24 (×7): qty 1

## 2017-04-24 MED ORDER — IPRATROPIUM BROMIDE 0.02 % IN SOLN
1.0000 mg | Freq: Once | RESPIRATORY_TRACT | Status: AC
  Administered 2017-04-24: 1 mg via RESPIRATORY_TRACT
  Filled 2017-04-24: qty 5

## 2017-04-24 MED ORDER — POTASSIUM CHLORIDE CRYS ER 20 MEQ PO TBCR
20.0000 meq | EXTENDED_RELEASE_TABLET | Freq: Every day | ORAL | Status: DC
  Administered 2017-04-24 – 2017-04-27 (×4): 20 meq via ORAL
  Filled 2017-04-24 (×4): qty 1

## 2017-04-24 MED ORDER — SODIUM CHLORIDE 0.9% FLUSH
3.0000 mL | Freq: Two times a day (BID) | INTRAVENOUS | Status: DC
  Administered 2017-04-26: 3 mL via INTRAVENOUS

## 2017-04-24 MED ORDER — INSULIN ASPART 100 UNIT/ML ~~LOC~~ SOLN
0.0000 [IU] | Freq: Three times a day (TID) | SUBCUTANEOUS | Status: DC
Start: 2017-04-24 — End: 2017-04-27
  Administered 2017-04-24: 20 [IU] via SUBCUTANEOUS
  Administered 2017-04-25: 11 [IU] via SUBCUTANEOUS
  Administered 2017-04-25: 15 [IU] via SUBCUTANEOUS
  Administered 2017-04-25: 20 [IU] via SUBCUTANEOUS
  Administered 2017-04-26 (×2): 7 [IU] via SUBCUTANEOUS
  Administered 2017-04-26: 20 [IU] via SUBCUTANEOUS
  Administered 2017-04-27: 7 [IU] via SUBCUTANEOUS
  Administered 2017-04-27: 4 [IU] via SUBCUTANEOUS
  Administered 2017-04-27: 11 [IU] via SUBCUTANEOUS

## 2017-04-24 MED ORDER — HYDROCODONE-ACETAMINOPHEN 5-325 MG PO TABS
1.0000 | ORAL_TABLET | Freq: Four times a day (QID) | ORAL | Status: DC | PRN
  Administered 2017-04-24 – 2017-04-25 (×3): 2 via ORAL
  Administered 2017-04-25: 1 via ORAL
  Administered 2017-04-25 – 2017-04-27 (×6): 2 via ORAL
  Filled 2017-04-24 (×10): qty 2

## 2017-04-24 MED ORDER — TORSEMIDE 20 MG PO TABS
20.0000 mg | ORAL_TABLET | Freq: Every day | ORAL | Status: DC
  Administered 2017-04-24 – 2017-04-27 (×4): 20 mg via ORAL
  Filled 2017-04-24 (×4): qty 1

## 2017-04-24 MED ORDER — IPRATROPIUM BROMIDE 0.02 % IN SOLN: 0.5000 mg | Freq: Four times a day (QID) | RESPIRATORY_TRACT | Status: DC

## 2017-04-24 MED ORDER — SODIUM CHLORIDE 0.9 % IV SOLN: 250.0000 mL | INTRAVENOUS | Status: DC | PRN

## 2017-04-24 MED ORDER — ONDANSETRON HCL 4 MG PO TABS: 4.0000 mg | ORAL_TABLET | Freq: Four times a day (QID) | ORAL | Status: DC | PRN

## 2017-04-24 MED ORDER — METHYLPREDNISOLONE SODIUM SUCC 40 MG IJ SOLR
40.0000 mg | Freq: Two times a day (BID) | INTRAMUSCULAR | Status: DC
  Administered 2017-04-24 – 2017-04-25 (×2): 40 mg via INTRAVENOUS
  Filled 2017-04-24 (×2): qty 1

## 2017-04-24 MED ORDER — IPRATROPIUM-ALBUTEROL 0.5-2.5 (3) MG/3ML IN SOLN
3.0000 mL | Freq: Four times a day (QID) | RESPIRATORY_TRACT | Status: DC
  Administered 2017-04-24 – 2017-04-25 (×6): 3 mL via RESPIRATORY_TRACT
  Filled 2017-04-24 (×6): qty 3

## 2017-04-24 MED ORDER — TIOTROPIUM BROMIDE MONOHYDRATE 18 MCG IN CAPS
1.0000 | ORAL_CAPSULE | Freq: Every day | RESPIRATORY_TRACT | Status: DC
Start: 2017-04-25 — End: 2017-04-25
  Filled 2017-04-24: qty 5

## 2017-04-24 MED ORDER — MOMETASONE FURO-FORMOTEROL FUM 200-5 MCG/ACT IN AERO
2.0000 | INHALATION_SPRAY | Freq: Two times a day (BID) | RESPIRATORY_TRACT | Status: DC
  Administered 2017-04-24: 2 via RESPIRATORY_TRACT
  Filled 2017-04-24: qty 8.8

## 2017-04-24 MED ORDER — ENOXAPARIN SODIUM 40 MG/0.4ML ~~LOC~~ SOLN
40.0000 mg | SUBCUTANEOUS | Status: DC
  Administered 2017-04-25 – 2017-04-27 (×3): 40 mg via SUBCUTANEOUS
  Filled 2017-04-24 (×3): qty 0.4

## 2017-04-24 MED ORDER — ALBUTEROL (5 MG/ML) CONTINUOUS INHALATION SOLN
10.0000 mg/h | INHALATION_SOLUTION | Freq: Once | RESPIRATORY_TRACT | Status: AC
  Administered 2017-04-24: 10 mg/h via RESPIRATORY_TRACT
  Filled 2017-04-24: qty 20

## 2017-04-24 MED ORDER — POTASSIUM CHLORIDE IN NACL 20-0.9 MEQ/L-% IV SOLN
INTRAVENOUS | Status: AC
  Administered 2017-04-24 – 2017-04-25 (×3): via INTRAVENOUS
  Filled 2017-04-24 (×5): qty 1000

## 2017-04-24 MED ORDER — ROFLUMILAST 500 MCG PO TABS
500.0000 ug | ORAL_TABLET | Freq: Every day | ORAL | Status: DC
  Administered 2017-04-24 – 2017-04-27 (×4): 500 ug via ORAL
  Filled 2017-04-24 (×5): qty 1

## 2017-04-24 MED ORDER — SODIUM CHLORIDE 0.9% FLUSH
3.0000 mL | Freq: Two times a day (BID) | INTRAVENOUS | Status: DC
  Administered 2017-04-24 – 2017-04-26 (×3): 3 mL via INTRAVENOUS

## 2017-04-24 MED ORDER — ALBUTEROL SULFATE (2.5 MG/3ML) 0.083% IN NEBU: 2.5000 mg | INHALATION_SOLUTION | Freq: Four times a day (QID) | RESPIRATORY_TRACT | Status: DC

## 2017-04-24 MED ORDER — METHYLPREDNISOLONE SODIUM SUCC 125 MG IJ SOLR
125.0000 mg | Freq: Once | INTRAMUSCULAR | Status: AC
  Administered 2017-04-24: 125 mg via INTRAVENOUS
  Filled 2017-04-24: qty 2

## 2017-04-24 MED ORDER — ALBUTEROL SULFATE HFA 108 (90 BASE) MCG/ACT IN AERS: 2.0000 | INHALATION_SPRAY | Freq: Four times a day (QID) | RESPIRATORY_TRACT | Status: DC | PRN

## 2017-04-24 MED ORDER — ACETAMINOPHEN 325 MG PO TABS: 650.0000 mg | ORAL_TABLET | Freq: Four times a day (QID) | ORAL | Status: DC | PRN

## 2017-04-24 MED ORDER — SODIUM CHLORIDE 0.9% FLUSH: 3.0000 mL | INTRAVENOUS | Status: DC | PRN

## 2017-04-24 MED ORDER — INSULIN ASPART 100 UNIT/ML ~~LOC~~ SOLN
0.0000 [IU] | Freq: Every day | SUBCUTANEOUS | Status: DC
  Administered 2017-04-24: 4 [IU] via SUBCUTANEOUS
  Administered 2017-04-25: 3 [IU] via SUBCUTANEOUS
  Administered 2017-04-26: 4 [IU] via SUBCUTANEOUS

## 2017-04-24 NOTE — Consult Note (Addendum)
PULMONARY / CRITICAL CARE MEDICINE   Name: Mary [name] MRN: [id number] DOB: Oct 26, 1976    ADMISSION DATE:  04/24/2017 CONSULTATION DATE:  04/24/17  REFERRING MD:  Legrand Pitts MD  CHIEF COMPLAINT:  COPD exacerbation  HISTORY OF PRESENT ILLNESS:   41 year old with severe COPD on home oxygen, as chronic steroids, ongoing tobacco use, alcohol use, OSA on BiPAP at home, pulm HTN with corpulmonale. 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 She's had recurrent COPD exacerbations over the past year with acute on chronic hypercapnic, hypoxic respiratory failure.  She returns with respiratory failure, worsening cough, dyspnea, drowsiness. She feels that her BiPAP was not working very well and has been having intermittent tremors in her arms or legs. Noted to have elevated PCO2 with compensated pH. She denies any fevers, chills, sick contacts. Admitted by hospitalist for management of hypercarbic respiratory failure, COPD exacerbation. PCCM consulted for further management.  PAST MEDICAL HISTORY :  She  has a past medical history of Axillary adenopathy; Chronic right-sided CHF (congestive heart failure) ([place]) (03/23/2012); COPD (chronic obstructive pulmonary disease) ([place]); DVT of upper extremity (deep vein thrombosis) [facility]) (April 2013); Exertional shortness of breath; Hepatic cirrhosis ([place]); History of alcoholism ([place]); History of chronic bronchitis; Irregular menses; Migraine; Moderate to severe pulmonary hypertension [facility]) (April 2013); On home oxygen therapy; OSA (obstructive sleep apnea); Periodontal disease; Pneumonia (~ 1985; 01/2011); Pulmonary embolus [facility]) (April 2013); and Pulmonary hypertension ([place]).  PAST SURGICAL HISTORY: She  has a past surgical history that includes Cardiac surgery (February 19, 1976); Lung surgery ([birth year]); Appendectomy (05/03/2013); Cardiac catheterization (03/2012); laparoscopic appendectomy (N/A, 05/03/2013); right heart  catheterization (N/A, 03/07/2012); and Cardiac catheterization (N/A, 01/08/2016).  No Known Allergies  No current facility-administered medications on file prior to encounter.    Current Outpatient Prescriptions on File Prior to Encounter  Medication Sig  . albuterol (PROVENTIL HFA;VENTOLIN HFA) 108 (90 BASE) MCG/ACT inhaler Inhale 2 puffs into the lungs every 6 (six) hours as needed for wheezing or shortness of breath.  . Blood Glucose Monitoring Suppl (ONE TOUCH ULTRA 2) w/Device KIT USE TO CHECK BLOOD SUGARS TWICE A DAY  . budesonide-formoterol (SYMBICORT) 160-4.5 MCG/ACT inhaler Inhale 2 puffs into the lungs 2 (two) times daily.  Marland Kitchen gabapentin (NEURONTIN) 300 MG capsule TAKE 2 CAPSULES BY MOUTH 3 TIMES DAILY.  Marland Kitchen guaiFENesin (MUCINEX) 600 MG 12 hr tablet Take 1 tablet (600 mg total) by mouth 2 (two) times daily.  Marland Kitchen HYDROcodone-acetaminophen (NORCO/VICODIN) 5-325 MG tablet Take 1-2 tablets by mouth every 6 (six) hours as needed.  . insulin glargine (LANTUS) 100 UNIT/ML injection Inject 0.25 mLs (25 Units total) into the skin daily.  . pantoprazole (PROTONIX) 40 MG tablet Take 1 tablet (40 mg total) by mouth 2 (two) times daily before a meal. (Patient taking differently: Take 40 mg by mouth daily. )  . potassium chloride SA (K-DUR,KLOR-CON) 20 MEQ tablet Take 1 tablet (20 mEq total) by mouth daily.  . predniSONE (DELTASONE) 5 MG tablet Resume 5 mg daily after you have completed the 96m tab taper  . pregabalin (LYRICA) 50 MG capsule Take 2 capsules (100 mg total) by mouth 3 (three) times daily.  . roflumilast (DALIRESP) 500 MCG TABS tablet Take 500 mcg by mouth daily.  . Tiotropium Bromide Monohydrate (SPIRIVA RESPIMAT) 1.25 MCG/ACT AERS Inhale 2 puffs into the lungs daily.  .Marland Kitchentorsemide (DEMADEX) 20 MG tablet Take 1 tablet (20 mg total) by mouth daily.  . traMADol (  ULTRAM) 50 MG tablet TAKE 1 TABLET EVERY 6 HOURS AS NEEDED FOR SEVERE PAIN  . insulin lispro (HUMALOG) 100 UNIT/ML injection Inject  0-0.15 mLs (0-15 Units total) into the skin 3 (three) times daily with meals. CBG < 70: Eat or drink something right away and recheck. CBG 70 - 120: 0 units CBG 121 - 150: 2 units CBG 151 - 200: 3 units CBG 201 - 250: 5 units CBG 251 - 300: 8 units CBG 301 - 350: 11 units CBG 351 - 400: 15 units CBG > 400: call MD.    FAMILY HISTORY:  Her indicated that her father is deceased. She indicated that the status of her brother is unknown. She indicated that her maternal grandmother is deceased. She indicated that her maternal grandfather is deceased. She indicated that her paternal grandmother is deceased. She indicated that her paternal grandfather is deceased.    SOCIAL HISTORY: She  reports that she has been smoking Cigarettes.  She has a 25.00 pack-year smoking history. She has never used smokeless tobacco. She reports that she drinks about 0.6 oz of alcohol per week . She reports that she does not use drugs.  REVIEW OF SYSTEMS:   Complains of dyspnea, cough, wheezing. Denies any sputum production, fevers, chills Denies any chest pain, palpitations Denies significant ulcer, vomiting or diarrhea, constipation. All other review of systems are negative  SUBJECTIVE:    VITAL SIGNS: BP 133/87   Pulse (!) 107   Temp 98.4 F (36.9 C) (Axillary)   Resp (!) 34   Ht 5' 2.5" (1.588 m)   Wt 205 lb (93 kg)   LMP 04/20/2017 (Approximate)   SpO2 92%   BMI 36.90 kg/m   HEMODYNAMICS:    VENTILATOR SETTINGS:    INTAKE / OUTPUT: No intake/output data recorded.  PHYSICAL EXAMINATION: Gen:      No acute distress HEENT:  EOMI, sclera anicteric Neck:     No masses; no thyromegaly Lungs:    Mild scattered exp wheeze; normal respiratory effort CV:         Regular rate and rhythm; no murmurs Abd:      + bowel sounds; soft, non-tender; no palpable masses, no distension Ext:    No edema; adequate peripheral perfusion Skin:      Warm and dry; no rash Neuro: alert and oriented x 3 Psych:  normal mood and affect  LABS:  BMET  Recent Labs Lab 04/20/17 0009 04/22/17 1153 04/24/17 0950  NA 139 141 139  K 3.1* 3.7 3.5  CL 84* 90* 85*  CO2 41* 48* 45*  BUN 7 14 <5*  CREATININE 0.47 0.54 0.56  GLUCOSE 230* 165* 156*    Electrolytes  Recent Labs Lab 04/20/17 0009 04/22/17 1153 04/24/17 0950  CALCIUM 9.3 9.3 8.8*  MG  --   --  1.8    CBC  Recent Labs Lab 04/20/17 0009 04/24/17 0950  WBC 9.6 9.6  HGB 14.2 14.1  HCT 46.2* 46.6*  PLT 149* 132*    Coag's No results for input(s): APTT, INR in the last 168 hours.  Sepsis Markers No results for input(s): LATICACIDVEN, PROCALCITON, O2SATVEN in the last 168 hours.  ABG  Recent Labs Lab 04/20/17 0100 04/24/17 1053  PHART 7.303* 7.391  PCO2ART 92.8* 84.6*  PO2ART 54.5* 55.0*    Liver Enzymes  Recent Labs Lab 04/24/17 0950  AST 72*  ALT 113*  ALKPHOS 115  BILITOT 1.3*  ALBUMIN 3.6    Cardiac Enzymes  Recent  Labs Lab 04/22/17 1153  PROBNP 129.0*    Glucose  Recent Labs Lab 04/24/17 1255  GLUCAP 202*    Imaging Dg Chest Portable 1 View  Result Date: 04/24/2017 CLINICAL DATA:  Pt arrives from home via [facility] reporting SOB, increased lethargy, "feeling off" since fall on Tuesday. Denies chest pain and cough. Hx of COPD, HTN, pneumonia. Patient shielded. EXAM: PORTABLE CHEST - 1 VIEW COMPARISON:  04/20/2017 FINDINGS: Lungs are clear. Resolution of the perihilar opacities described previously. Heart size and mediastinal contours are within normal limits for technique and positioning. No definite effusion. Visualized bones unremarkable. IMPRESSION: No acute findings Electronically Signed   By: Lucrezia Europe M.D.   On: 04/24/2017 10:25     STUDIES:  Pulmonary tests: A1AT 03/07/12 >> 340, MM PFTS 04/10/12 >> FEV1 0.71 (24%), FEV1% 48, TLC 4.21 (88%), DLCO 32%, + BD  Severe obstruction with trapping and DLCO impairment, + bronchodilator response  ABG 04/20/17 >> 7.3/93/55/85% ABG 04/24/17  >> 7.4/85/55/86%  CT high resolution 02/01/17 >> scattered scarring with air trapping enlarged PA, right upper lobe groundglass opacity stable from 2016 Chest x-ray 04/24/17 >> no acute lung infiltrate I have reviewed all images personally.  Cardiac tests Echo 02/01/17>> LVEF 65-70 percent, grade 1 diastolic dysfunction. RV and RA is mildly dilated, mild TR with PAP 42 RHC 01/08/16 >> RA 11, RV 41/4/8, PA 45/16/35, PCWP 11, CI 2.4, PVR 5.3 WU  Sleept tests: PSG 04/04/12 >> AHI 5, SpO2 low 73%  CULTURES:   ANTIBIOTICS: None  SIGNIFICANT EVENTS: 5/20 Admit with hypercarbic failure  LINES/TUBES:   DISCUSSION: 41 year old with severe COPD, OSA, recurrent respiratory failure admitted with COPD exacerbation, hypercarbia, hypoxia. Continues to smoke and states that her last cigarette was a couple of spots a few days ago  Her current admission to have been precipitated by a dysfunctional BiPAP machine. She does not give any symptoms of active infection.  - Use hospital BiPAP as needed during the day and mandatory at night - Given her recurrent admissions with hypercarbia she will benefit from trilogy ventilator - Check pro calcitonin and virus panel. Okay to observe off antibiotic - Continue standing duonebs and albuterol PRN. - Solumedrol 40 mg q12. Should be able to start taper tomorrow. - Needs to quit smoking completely. Discussed with pt  PCCM will follow.  More then 1/2 the time of the 40 min visit was spent in counseling and/or coordination of care with the patient and family.  Marshell Garfinkel MD [facility] Pager [phone] If no answer or after 3pm call: [phone] 04/24/2017, 4:56 PM

## 2017-04-24 NOTE — H&P (Signed)
History and Physical    Mary [name] YQM:[id number] DOB: 1976-06-21 DOA: 04/24/2017  PCP: Golden Circle, FNP (Confirm with patient/family/NH records and if not entered, this has to be entered at [facility] point of entry) Patient coming from: home  I have personally briefly reviewed patient's old medical records in [place]  Chief Complaint: Shortness of breath and weakness HPI: Mary [name] is a 41 y.o. female with medical history significant of  severe COPD (Dr. 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   Pt was just seen 5/15 for ankle pain after falling while off oxygen. She states that since then, she has had progressively worsening cough, SOB, and drowsiness. She has also noticed that her BIPAP machine is no longer "working like it should" and is due to be replaced. She states that over the past 2-3 days, she has had progressively worsening cough, SOB, and drowsiness. She is easily falling asleep and feels "weird" during the day with lightheadedness and extreme fatigue. She has had mild worsening of chronic facial and leg swelling as well. Her ankle is improving. No syncopal episodes. No chest pain though she does feel mild chest pressure, which is common for her during COPD exacerbations. She feels like the last time she was hospitalized. She has also recently been told her pCO2 levels are rising on outpt labs. She has not been regularly  checking her O2 levels at home. The batteries need to be replaced in her pulse oximeter. She is on 3.5-4L at baseline since her last hospitalization. She also complains of myoclonic jerks which are uncontrollable.   (For level 3, the HPI must include 4+ descriptors: Location, Quality, Severity, Duration, Timing, Context, modifying factors, associated signs/symptoms and/or status of 3+ chronic problems.)  (Please avoid self-populating past medical history here) (The initial 2-3 lines should be focused and good to copy and paste in the HPI section of the daily progress note).  ED Course: Patient with tachycardia and diffuse wheezing with diminished aeration she was given continuous albuterol treatments and started with IV steroids and magnesium. ABG showed chronic CO2 retention but no need for acute BiPAP. CMP, CBC with differential, EKG and chest x-ray were unremarkable.  pt Continue to have increased work of breathing relative to her baseline with diffuse wheezing. She was seen in pulmonology office this week and did not have any wheezing at that time. She was therefore referred to [place] for further evaluation and management.   Review of Systems  Constitutional: Negative for chills and fever.  HENT: Negative for congestion, ear discharge, nosebleeds, sinus pain and sore throat.   Eyes: Negative for blurred vision and double vision.  Respiratory: Positive for shortness of breath and wheezing. Negative for cough and sputum production.   Cardiovascular: Negative for chest pain, palpitations, orthopnea and leg swelling.  Gastrointestinal: Negative for heartburn, nausea and vomiting.  Genitourinary: Negative for dysuria, frequency and urgency.  Musculoskeletal: Negative for back pain, myalgias and neck pain.  Skin: Negative for itching and rash.  Neurological: Positive for weakness. Negative for dizziness and headaches.  Endo/Heme/Allergies: Negative for environmental allergies. Does not bruise/bleed  easily.  Psychiatric/Behavioral: Negative for depression and suicidal ideas.   Past Medical History:  Diagnosis Date  . Axillary adenopathy    right axillary adenopathy noted on CT chest (03/06/2012)  . Chronic right-sided CHF (congestive heart failure) ([place]) 03/23/2012   with cor pulmonale. Last RHC 03/2012  . COPD (chronic obstructive pulmonary disease) ([place])    PFTS 03/08/12: fev1 0.58L/1%, FVC 1.18/33%, Ratop 49 and c/w ssevere obstruction. 21% BD response on FVC, RV 219%, DLCO 11/54%  . DVT of upper extremity (deep vein thrombosis) [facility]) April 2013   right subclavian // Unclear precipitating cause - possibly significant right heart failure, with vascular stasis potentially predisposing to clotting.    . Exertional shortness of breath   . Hepatic cirrhosis ([place])    Questionable history of - Noted on CT abdomen (03/2012) - thought to be due to vascular congestion from right heart failure +/- patient's history of alcohol abuse  . History of alcoholism ([place])   . History of chronic bronchitis   . Irregular menses   . Migraine    "~ 1/yr" (05/03/2013)  . Moderate to severe pulmonary hypertension [facility]) April 2013   Cardiac cath on 03/06/12 - 1. Elevated pulmonary artery pressures, right sided filling pressures.,  2. PA: 64/45 (mean 53)    . On home oxygen therapy    "2-3 L 24/7" (05/03/2013)  . OSA (obstructive sleep apnea)    wears noctural BiPAP (05/03/2013)  . Periodontal disease   . Pneumonia ~ 1985; 01/2011  . Pulmonary embolus [facility]) April 2013   Precipitating cause unclear. Was treated with coumadin from April-June 2013.  . Pulmonary hypertension ([place])     Past Surgical History:  Procedure Laterality Date  . APPENDECTOMY  05/03/2013  . CARDIAC CATHETERIZATION  03/2012  . CARDIAC CATHETERIZATION N/A 01/08/2016   Procedure: Right Heart Cath;  Surgeon: Jolaine Artist, MD;  Location: [place] [facility];  Service: Cardiovascular;  Laterality: N/A;  . CARDIAC SURGERY  24-Mar-1977   "my  heart was backwards" (05/03/2013)  . LAPAROSCOPIC APPENDECTOMY N/A 05/03/2013   Procedure: APPENDECTOMY LAPAROSCOPIC;  Surgeon: Gwenyth Ober, MD;  Location: [place];  Service: General;  Laterality: N/A;  . LUNG SURGERY  06/27/1977  . RIGHT HEART CATHETERIZATION N/A 03/07/2012   Procedure: RIGHT HEART CATH;  Surgeon: Burnell Blanks, MD;  Location: [facility] CATH LAB;  Service: Cardiovascular;  Laterality: N/A;     reports that she has been smoking Cigarettes.  She has a 25.00 pack-year smoking history. She has never used smokeless tobacco. She reports that she drinks about 0.6 oz of alcohol per week . She reports that she does not use drugs.  No Known Allergies  Family History  Problem Relation Age of Onset  . Hypertension Father   . Heart disease Father        CHF; died age 77   . Alcohol abuse Father   . Other Brother        died age 73 y.o overdose     Prior to Admission medications   Medication Sig Start Date End Date Taking? Authorizing Provider  albuterol (PROVENTIL HFA;VENTOLIN HFA) 108 (90 BASE) MCG/ACT inhaler Inhale 2 puffs into the lungs every 6 (six) hours as needed for wheezing or shortness of breath. 09/06/14  Yes Brand Males, MD  Blood Glucose Monitoring Suppl (ONE TOUCH ULTRA 2) w/Device KIT USE TO CHECK BLOOD SUGARS TWICE A DAY 04/21/17  Yes Mauricio Po  D, FNP  budesonide-formoterol (SYMBICORT) 160-4.5 MCG/ACT inhaler Inhale 2 puffs into the lungs 2 (two) times daily. 03/28/17  Yes Erick Colace, NP  gabapentin (NEURONTIN) 300 MG capsule TAKE 2 CAPSULES BY MOUTH 3 TIMES DAILY. 04/21/17  Yes Golden Circle, FNP  guaiFENesin (MUCINEX) 600 MG 12 hr tablet Take 1 tablet (600 mg total) by mouth 2 (two) times daily. 02/11/17  Yes Barton Dubois, MD  HYDROcodone-acetaminophen (NORCO/VICODIN) 5-325 MG tablet Take 1-2 tablets by mouth every 6 (six) hours as needed. 04/20/17  Yes Horton, Barbette Hair, MD  insulin glargine (LANTUS) 100 UNIT/ML injection Inject 0.25 mLs (25  Units total) into the skin daily. 03/29/17  Yes Hongalgi, Lenis Dickinson, MD  pantoprazole (PROTONIX) 40 MG tablet Take 1 tablet (40 mg total) by mouth 2 (two) times daily before a meal. Patient taking differently: Take 40 mg by mouth daily.  07/22/16  Yes Kelvin Cellar, MD  potassium chloride SA (K-DUR,KLOR-CON) 20 MEQ tablet Take 1 tablet (20 mEq total) by mouth daily. 01/29/16  Yes Bensimhon, Shaune Pascal, MD  predniSONE (DELTASONE) 5 MG tablet Resume 5 mg daily after you have completed the 6m tab taper 03/28/17  Yes BErick Colace NP  pregabalin (LYRICA) 50 MG capsule Take 2 capsules (100 mg total) by mouth 3 (three) times daily. 04/21/17  Yes CGolden Circle FNP  roflumilast (DALIRESP) 500 MCG TABS tablet Take 500 mcg by mouth daily.   Yes [provider]  Tiotropium Bromide Monohydrate (SPIRIVA RESPIMAT) 1.25 MCG/ACT AERS Inhale 2 puffs into the lungs daily. 04/06/17  Yes RBrand Males MD  torsemide (DEMADEX) 20 MG tablet Take 1 tablet (20 mg total) by mouth daily. 12/03/16  Yes RBrand Males MD  traMADol (ULTRAM) 50 MG tablet TAKE 1 TABLET EVERY 6 HOURS AS NEEDED FOR SEVERE PAIN 04/21/17  Yes CGolden Circle FNP  insulin lispro (HUMALOG) 100 UNIT/ML injection Inject 0-0.15 mLs (0-15 Units total) into the skin 3 (three) times daily with meals. CBG < 70: Eat or drink something right away and recheck. CBG 70 - 120: 0 units CBG 121 - 150: 2 units CBG 151 - 200: 3 units CBG 201 - 250: 5 units CBG 251 - 300: 8 units CBG 301 - 350: 11 units CBG 351 - 400: 15 units CBG > 400: call MD. 03/28/17   HModena Jansky MD    Physical Exam: Vitals:   04/24/17 1200 04/24/17 1215 04/24/17 1230 04/24/17 1245  BP: (!) 129/96 140/89 (!) 141/87 133/87  Pulse: (!) 106 (!) 108 (!) 107 (!) 107  Resp: (!) 23 19 (!) 22 (!) 34  Temp:      TempSrc:      SpO2: (!) 89% 91% 90% (!) 88%  Weight:      Height:        Constitutional: NAD, calm, comfortable, Increased work of breathing. Vitals:     04/24/17 1200 04/24/17 1215 04/24/17 1230 04/24/17 1245  BP: (!) 129/96 140/89 (!) 141/87 133/87  Pulse: (!) 106 (!) 108 (!) 107 (!) 107  Resp: (!) 23 19 (!) 22 (!) 34  Temp:      TempSrc:      SpO2: (!) 89% 91% 90% (!) 88%  Weight:      Height:       Eyes: PERRL, lids and conjunctivae normal ENMT: Mucous membranes are moist. Posterior pharynx clear of any exudate or lesions.Normal dentition.  Neck: normal, supple, no masses, no thyromegaly Respiratory: Tachypnea with mild respiratory distress decreased  breath sounds on the right side. Wheezing bilaterally no refills. Sounds are markedly diminished with diffuse inspiratory and expiratory wheezing Cardiovascular: Tachycardic, no murmurs / rubs / gallops. No extremity edema. 2+ pedal pulses. No carotid bruits.  Abdomen: no tenderness, no masses palpated. No hepatosplenomegaly. Bowel sounds positive.  Musculoskeletal: no clubbing / cyanosis. No joint deformity upper and lower extremities. Good ROM, no contractures. Normal muscle tone.  Skin: no rashes, lesions, ulcers. No induration Neurologic: CN 2-12 grossly intact. Sensation intact, DTR normal. Strength 5/5 in all 4.  Psychiatric: Normal judgment and insight. Alert and oriented x 3. Normal mood.   Labs on Admission: I have personally reviewed following labs and imaging studies  CBC:  Recent Labs Lab 04/20/17 0009 04/24/17 0950  WBC 9.6 9.6  NEUTROABS 8.1* 6.7  HGB 14.2 14.1  HCT 46.2* 46.6*  MCV 101.5* 103.8*  PLT 149* 176*   Basic Metabolic Panel:  Recent Labs Lab 04/20/17 0009 04/22/17 1153 04/24/17 0950  NA 139 141 139  K 3.1* 3.7 3.5  CL 84* 90* 85*  CO2 41* 48* 45*  GLUCOSE 230* 165* 156*  BUN 7 14 <5*  CREATININE 0.47 0.54 0.56  CALCIUM 9.3 9.3 8.8*  MG  --   --  1.8   GFR: Estimated Creatinine Clearance: 101.4 mL/min (by C-G formula based on SCr of 0.56 mg/dL). Liver Function Tests:  Recent Labs Lab 04/24/17 0950  AST 72*  ALT 113*  ALKPHOS 115   BILITOT 1.3*  PROT 6.5  ALBUMIN 3.6   BNP (last 3 results)  Recent Labs  04/22/17 1153  PROBNP 129.0*   Urine analysis:    Component Value Date/Time   COLORURINE STRAW (A) 01/31/2017 2144   APPEARANCEUR CLEAR 01/31/2017 2144   LABSPEC 1.015 01/31/2017 2144   PHURINE 5.0 01/31/2017 2144   GLUCOSEU >=500 (A) 01/31/2017 2144   HGBUR NEGATIVE 01/31/2017 2144   [place] NEGATIVE 01/31/2017 2144   KETONESUR NEGATIVE 01/31/2017 2144   PROTEINUR NEGATIVE 01/31/2017 2144   UROBILINOGEN 1.0 07/25/2015 1400   NITRITE NEGATIVE 01/31/2017 2144   LEUKOCYTESUR NEGATIVE 01/31/2017 2144    Radiological Exams on Admission: Dg Chest Portable 1 View  Result Date: 04/24/2017 CLINICAL DATA:  Pt arrives from home via [facility] reporting SOB, increased lethargy, "feeling off" since fall on Tuesday. Denies chest pain and cough. Hx of COPD, HTN, pneumonia. Patient shielded. EXAM: PORTABLE CHEST - 1 VIEW COMPARISON:  04/20/2017 FINDINGS: Lungs are clear. Resolution of the perihilar opacities described previously. Heart size and mediastinal contours are within normal limits for technique and positioning. No definite effusion. Visualized bones unremarkable. IMPRESSION: No acute findings Electronically Signed   By: Lucrezia Europe M.D.   On: 04/24/2017 10:25    EKG: Independently reviewed. Sinus tachycardia with right atrial enlargement   Assessment/Plan Principal Problem:   Acute exacerbation of chronic obstructive pulmonary disease (COPD) (HCC) Active Problems:   Acute on chronic respiratory failure with hypoxia and hypercapnia (HCC)   OSA on BiPAP   Pulmonary hypertension (HCC)   Obesity hypoventilation syndrome (HCC)   Steroid-induced diabetes ([place])   Hypokalemia   Morbid obesity ([place])   Chronic right-sided CHF (congestive heart failure) ([place])    1. 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 Unfortunately she decided to take up smoking and  was quite a heavy smoker. She continues to enjoy smoking despite multiple attempts at encouraging quitting smoking. We have had really awful weather lately with high humidity and high O zone levels and the patient has had great difficulty with breathing at home. She's also had problems with her BiPAP machine not functioning properly and has been tingling her home DME company. Apparently she has the ability to get a new machine but the DME company is having difficulties with accurate records.  Patient will be admitted into the hospital we'll continue steroid dosing as well as nebulized bronchodilators and Atrovent. We will avoid use of antibiotics at this point as patient has recently had several courses of antibiotics. Will consult pulmonary. She is followed by Dr. Chase Caller.  2. Acute on chronic respiratory failure with hypoxia and hypercapnia. At this point she does not require use of a BiPAP but she will certainly need it at at bedtime and will order that for use. Will resume previous BiPAP orders from last admission. We'll monitor her respiratory status and check ABGs as needed.  3. Obstructive sleep apnea on BiPAP. We'll continue BiPAP here. We'll need to use one of our machines as her machine is not functioning properly. We'll also consult social work for assistance with BiPAP machine maintenance or exchange.  4. Pulmonary hypertension continue diuretic dosing.  5. Obesity hypoventilation syndrome. Continue home medication regimen which includes same regimen as for her acute exacerbation of chronic obstructive pulmonary disease.  6. Steroid-induced diabetes will order diabetes management sliding scale and monitor fingerstick's closely.  7. Hypokalemia continue home potassium dosing.  8. Morbid obesity weight loss and increased activity is  encouraged.  9. Chronic right-sided congestive heart failure likely related to pulmonary hypertension we'll continue home medication regimen.    DVT prophylaxis: Lovenox Code Status: Full CODE STATUS.  Family Communication: No family present at the time of admission. Disposition Plan: Home likely 3 or 4 days Consults called: Dr Vaughan Browner Pulmonary Admission status: inpt   Lady Deutscher MD [facility] Pager [phone]  If 7PM-7AM, please contact night-coverage [email or web address] Password [email or web address]  04/24/2017, 12:52 PM

## 2017-04-24 NOTE — ED Notes (Signed)
Attempted report x1. 

## 2017-04-24 NOTE — ED Provider Notes (Signed)
[facility] DEPT Provider Note   CSN: [id number] Arrival date & time: 04/24/17  0941     History   Chief Complaint Chief Complaint  Patient presents with  . Shortness of Breath  . Weakness    HPI Mary [name] is a 41 y.o. female.  HPI   41 yo F with h/o severe COPD, chronic hypoxic resp failure, chronic right-sided HF here with worsening SOB. Pt was just seen 5/15 for ankle pain after falling while off oxygen. She states that since then, she has had progressively worsening cough, SOB, and drowsiness. She has also noticed that her BIPAP machine is no longer "working like it should" and is due to be replaced. She states that over the past 2-3 days, she has had progressively worsening cough, SOB, and drowsiness. She is easily falling asleep and feels "weird" during the day with lightheadedness and extreme fatigue. She has had mild worsening of chronic facial and leg swelling as well. Her ankle is improving. No syncopal episodes. No chest pain though she does feel mild chest pressure, which is common for her during COPD exacerbations. She feels like the last time she was hospitalized. She has also recently been told her pCO2 levels are rising on outpt labs. She has not been regularly checking her O2 levels at home. She is on 3.5-4L at baseline since her last hospitalization.  Past Medical History:  Diagnosis Date  . Axillary adenopathy    right axillary adenopathy noted on CT chest (03/06/2012)  . Chronic right-sided CHF (congestive heart failure) ([place]) 03/23/2012   with cor pulmonale. Last RHC 03/2012  . COPD (chronic obstructive pulmonary disease) ([place])    PFTS 03/08/12: fev1 0.58L/1%, FVC 1.18/33%, Ratop 49 and c/w ssevere obstruction. 21% BD response on FVC, RV 219%, DLCO 11/54%  . DVT of upper extremity (deep vein thrombosis) [facility]) April 2013   right subclavian // Unclear precipitating cause - possibly significant right heart failure, with vascular stasis potentially predisposing  to clotting.    . Exertional shortness of breath   . Hepatic cirrhosis ([place])    Questionable history of - Noted on CT abdomen (03/2012) - thought to be due to vascular congestion from right heart failure +/- patient's history of alcohol abuse  . History of alcoholism ([place])   . History of chronic bronchitis   . Irregular menses   . Migraine    "~ 1/yr" (05/03/2013)  . Moderate to severe pulmonary hypertension [facility]) April 2013   Cardiac cath on 03/06/12 - 1. Elevated pulmonary artery pressures, right sided filling pressures.,  2. PA: 64/45 (mean 53)    . On home oxygen therapy    "2-3 L 24/7" (05/03/2013)  . OSA (obstructive sleep apnea)    wears noctural BiPAP (05/03/2013)  . Periodontal disease   . Pneumonia ~ 1985; 01/2011  . Pulmonary embolus [facility]) April 2013   Precipitating cause unclear. Was treated with coumadin from April-June 2013.  . Pulmonary hypertension [facility])     Patient Active Problem List   Diagnosis Date Noted  . Acute exacerbation of chronic obstructive pulmonary disease (COPD) ([place]) 04/24/2017  . 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 Left ankle sprain 04/21/2017  . Fungemia 02/13/2017  . COPD (chronic obstructive pulmonary disease) ([place]) 02/13/2017  . Healthcare-associated pneumonia 02/09/2017  . Alcohol abuse 01/31/2017  . Acute on chronic respiratory failure with hypoxia and hypercapnia (HCC)   . Tracheomalacia   . Palliative care by specialist   . Anxiety state   . Advance  directive declined by patient   . Goals of care, counseling/discussion   . Chronic hoarseness 07/27/2016  . Diabetic neuropathy ([place]) 04/30/2016  . Diabetes mellitus type 2 in obese ([place]) 04/10/2016  . COPD exacerbation ([place]) 04/01/2016  . Chronic diastolic heart failure ([place]) 01/25/2016  . Chronic respiratory failure with hypoxia ([place]) 01/21/2016  . PAH (pulmonary artery hypertension) ([place])   . Constipation 11/18/2015  . Morbid obesity ([place]) 11/18/2015  . Lung nodule 01/30/2015  .  Pulmonary mass 01/13/2015  . Gastroparesis   . Esophageal reflux   . Obstipation   . Hypokalemia 02/28/2013  . Financial difficulties 02/28/2013  . Obesity hypoventilation syndrome ([place]) 02/28/2013  . Myalgia 02/28/2013  . Anovulation 06/30/2012  . Preventative health care 05/05/2012  . Irregular menstrual cycle 04/27/2012  . Chronic right-sided CHF (congestive heart failure) ([place]) 03/23/2012  . Pulmonary hypertension ([place]) 03/23/2012  . Cor pulmonale ([place]) 03/21/2012  . OSA on BiPAP 03/19/2012  . Axillary lymphadenopathy 03/06/2012  . Congenital heart disease 01/28/2012  . Dental caries 01/28/2012    Past Surgical History:  Procedure Laterality Date  . APPENDECTOMY  05/03/2013  . CARDIAC CATHETERIZATION  03/2012  . CARDIAC CATHETERIZATION N/A 01/08/2016   Procedure: Right Heart Cath;  Surgeon: Jolaine Artist, MD;  Location: [facility];  Service: Cardiovascular;  Laterality: N/A;  . CARDIAC SURGERY  06-26-1977   "my heart was backwards" (05/03/2013)  . LAPAROSCOPIC APPENDECTOMY N/A 05/03/2013   Procedure: APPENDECTOMY LAPAROSCOPIC;  Surgeon: Gwenyth Ober, MD;  Location: [facility];  Service: General;  Laterality: N/A;  . LUNG SURGERY  December 03, 1977  . RIGHT HEART CATHETERIZATION N/A 03/07/2012   Procedure: RIGHT HEART CATH;  Surgeon: Burnell Blanks, MD;  Location: [facility] CATH LAB;  Service: Cardiovascular;  Laterality: N/A;    OB History    No data available       Home Medications    Prior to Admission medications   Medication Sig Start Date End Date Taking? Authorizing Provider  albuterol (PROVENTIL HFA;VENTOLIN HFA) 108 (90 BASE) MCG/ACT inhaler Inhale 2 puffs into the lungs every 6 (six) hours as needed for wheezing or shortness of breath. 09/06/14  Yes Brand Males, MD  Blood Glucose Monitoring Suppl (ONE TOUCH ULTRA 2) w/Device KIT USE TO CHECK BLOOD SUGARS TWICE A DAY 04/21/17  Yes Golden Circle, FNP  budesonide-formoterol (SYMBICORT) 160-4.5 MCG/ACT  inhaler Inhale 2 puffs into the lungs 2 (two) times daily. 03/28/17  Yes Erick Colace, NP  gabapentin (NEURONTIN) 300 MG capsule TAKE 2 CAPSULES BY MOUTH 3 TIMES DAILY. 04/21/17  Yes Golden Circle, FNP  guaiFENesin (MUCINEX) 600 MG 12 hr tablet Take 1 tablet (600 mg total) by mouth 2 (two) times daily. 02/11/17  Yes Barton Dubois, MD  HYDROcodone-acetaminophen (NORCO/VICODIN) 5-325 MG tablet Take 1-2 tablets by mouth every 6 (six) hours as needed. 04/20/17  Yes Horton, Barbette Hair, MD  insulin glargine (LANTUS) 100 UNIT/ML injection Inject 0.25 mLs (25 Units total) into the skin daily. 03/29/17  Yes Hongalgi, Lenis Dickinson, MD  pantoprazole (PROTONIX) 40 MG tablet Take 1 tablet (40 mg total) by mouth 2 (two) times daily before a meal. Patient taking differently: Take 40 mg by mouth daily.  07/22/16  Yes Kelvin Cellar, MD  potassium chloride SA (K-DUR,KLOR-CON) 20 MEQ tablet Take 1 tablet (20 mEq total) by mouth daily. 01/29/16  Yes Bensimhon, Shaune Pascal, MD  predniSONE (DELTASONE) 5 MG tablet Resume 5 mg daily after you have completed the 17m tab taper  03/28/17  Yes Erick Colace, NP  pregabalin (LYRICA) 50 MG capsule Take 2 capsules (100 mg total) by mouth 3 (three) times daily. 04/21/17  Yes Golden Circle, FNP  roflumilast (DALIRESP) 500 MCG TABS tablet Take 500 mcg by mouth daily.   Yes [provider]  Tiotropium Bromide Monohydrate (SPIRIVA RESPIMAT) 1.25 MCG/ACT AERS Inhale 2 puffs into the lungs daily. 04/06/17  Yes Brand Males, MD  torsemide (DEMADEX) 20 MG tablet Take 1 tablet (20 mg total) by mouth daily. 12/03/16  Yes Brand Males, MD  traMADol (ULTRAM) 50 MG tablet TAKE 1 TABLET EVERY 6 HOURS AS NEEDED FOR SEVERE PAIN 04/21/17  Yes Golden Circle, FNP  insulin lispro (HUMALOG) 100 UNIT/ML injection Inject 0-0.15 mLs (0-15 Units total) into the skin 3 (three) times daily with meals. CBG < 70: Eat or drink something right away and recheck. CBG 70 - 120: 0 units CBG 121  - 150: 2 units CBG 151 - 200: 3 units CBG 201 - 250: 5 units CBG 251 - 300: 8 units CBG 301 - 350: 11 units CBG 351 - 400: 15 units CBG > 400: call MD. 03/28/17   Modena Jansky, MD    Family History Family History  Problem Relation Age of Onset  . Hypertension Father   . Heart disease Father        CHF; died age 75   . Alcohol abuse Father   . Other Brother        died age 41 y.o overdose    Social History Social History  Substance Use Topics  . Smoking status: Current Some Day Smoker    Packs/day: 1.00    Years: 25.00    Types: Cigarettes    Last attempt to quit: 02/18/2012  . Smokeless tobacco: Never Used     Comment: smokes 1-2 cigs per day (03/14/17)  . Alcohol use 0.6 oz/week    1 Shots of liquor per week     Allergies   Patient has no known allergies.   Review of Systems Review of Systems  Constitutional: Positive for fatigue.  HENT: Positive for facial swelling.   Respiratory: Positive for cough, chest tightness, shortness of breath and wheezing.   Cardiovascular: Positive for leg swelling.  Neurological: Positive for weakness.  Psychiatric/Behavioral: Positive for confusion and decreased concentration.  All other systems reviewed and are negative.    Physical Exam Updated Vital Signs BP (!) 127/102 (BP Location: Left Arm)   Pulse (!) 102   Temp 98.6 F (37 C) (Oral)   Resp (!) 22   Ht 5' 2.5" (1.588 m)   Wt 205 lb (93 kg)   LMP 04/20/2017 (Approximate)   SpO2 94%   BMI 36.90 kg/m   Physical Exam  Constitutional: She is oriented to person, place, and time. She appears well-developed and well-nourished. She appears distressed.  Mod resp distress, appears distressed and older than stated age  HENT:  Head: Normocephalic and atraumatic.  Mouth/Throat: Oropharynx is clear and moist.  Mild periorbital edema  Eyes: Conjunctivae are normal.  Neck: Neck supple.  Cardiovascular: Regular rhythm and normal heart sounds.  Tachycardia present.  Exam  reveals no friction rub.   No murmur heard. Pulmonary/Chest: Tachypnea noted. She is in respiratory distress. She has decreased breath sounds in the right middle field. She has wheezes in the right upper field, the right middle field, the left upper field, the left middle field and the left lower field. She has no rales.  Markedly diminished bs bilaterally with diffuse inspiratory and expiratory wheezes  Abdominal: Soft. She exhibits no distension. There is no tenderness.  Musculoskeletal: She exhibits edema (mild, 1+ pitting edema b/l LE; ecchymoses of LLE without significant asymmetryc swelling).  Neurological: She is alert and oriented to person, place, and time. She exhibits normal muscle tone.  Skin: Skin is warm. Capillary refill takes less than 2 seconds.  Psychiatric: She has a normal mood and affect.  Nursing note and vitals reviewed.    ED Treatments / Results  Labs (all labs ordered are listed, but only abnormal results are displayed) Labs Reviewed  CBC WITH DIFFERENTIAL/PLATELET - Abnormal; Notable for the following:       Result Value   HCT 46.6 (*)    MCV 103.8 (*)    Platelets 132 (*)    All other components within normal limits  COMPREHENSIVE METABOLIC PANEL - Abnormal; Notable for the following:    Chloride 85 (*)    CO2 45 (*)    Glucose, Bld 156 (*)    BUN <5 (*)    Calcium 8.8 (*)    AST 72 (*)    ALT 113 (*)    Total Bilirubin 1.3 (*)    All other components within normal limits  BRAIN NATRIURETIC PEPTIDE - Abnormal; Notable for the following:    B Natriuretic Peptide 234.4 (*)    All other components within normal limits  GLUCOSE, CAPILLARY - Abnormal; Notable for the following:    Glucose-Capillary 388 (*)    All other components within normal limits  URINALYSIS, ROUTINE W REFLEX MICROSCOPIC - Abnormal; Notable for the following:    APPearance HAZY (*)    Glucose, UA >=500 (*)    Ketones, ur 5 (*)    Bacteria, UA MANY (*)    Squamous Epithelial / LPF  6-30 (*)    All other components within normal limits  CBG MONITORING, ED - Abnormal; Notable for the following:    Glucose-Capillary 202 (*)    All other components within normal limits  I-STAT ARTERIAL BLOOD GAS, ED - Abnormal; Notable for the following:    pCO2 arterial 84.6 (*)    pO2, Arterial 55.0 (*)    Bicarbonate 51.3 (*)    Acid-Base Excess 20.0 (*)    All other components within normal limits  RESPIRATORY PANEL BY PCR  MAGNESIUM  PROCALCITONIN  HEMOGLOBIN G8T  BASIC METABOLIC PANEL  CBC  INFLUENZA PANEL BY PCR (TYPE A & B)  I-STAT TROPOININ, ED  I-STAT BETA HCG BLOOD, ED ([facility], [facility], [facility] ONLY)    EKG  EKG Interpretation  Date/Time:  Sunday Apr 24 2017 09:58:41 EDT Ventricular Rate:  101 PR Interval:    QRS Duration: 83 QT Interval:  345 QTC Calculation: 448 R Axis:   102 Text Interpretation:  Sinus tachycardia Right atrial enlargement Borderline right axis deviation Low voltage, precordial leads Nonspecific T abnormalities, anterior leads Since last EKG, non-specific changes in V2-V3, otherwise no significant change No ST elevations No S1Q3T3 Confirmed by Jamye Balicki MD, Lysbeth Galas [phone]) on 04/24/2017 10:19:46 AM       Radiology Dg Chest Portable 1 View  Result Date: 04/24/2017 CLINICAL DATA:  Pt arrives from home via [facility] reporting SOB, increased lethargy, "feeling off" since fall on Tuesday. Denies chest pain and cough. Hx of COPD, HTN, pneumonia. Patient shielded. EXAM: PORTABLE CHEST - 1 VIEW COMPARISON:  04/20/2017 FINDINGS: Lungs are clear. Resolution of the perihilar opacities described previously. Heart size and mediastinal contours  are within normal limits for technique and positioning. No definite effusion. Visualized bones unremarkable. IMPRESSION: No acute findings Electronically Signed   By: Lucrezia Europe M.D.   On: 04/24/2017 10:25    Procedures .Critical Care Performed by: Duffy Bruce Authorized by: Duffy Bruce   Critical care provider statement:     Critical care time (minutes):  35    (including critical care time)  CRITICAL CARE Performed by: Evonnie Pat   Total critical care time: 35 minutes  Critical care time was exclusive of separately billable procedures and treating other patients.  Critical care was necessary to treat or prevent imminent or life-threatening deterioration.  Critical care was time spent personally by me on the following activities: development of treatment plan with patient and/or surrogate as well as nursing, discussions with consultants, evaluation of patient's response to treatment, examination of patient, obtaining history from patient or surrogate, ordering and performing treatments and interventions, ordering and review of laboratory studies, ordering and review of radiographic studies, pulse oximetry and re-evaluation of patient's condition.    Medications Ordered in ED Medications  gabapentin (NEURONTIN) capsule 600 mg (600 mg Oral Given 04/24/17 1515)  guaiFENesin (MUCINEX) 12 hr tablet 600 mg (600 mg Oral Given 04/24/17 1515)  HYDROcodone-acetaminophen (NORCO/VICODIN) 5-325 MG per tablet 1-2 tablet (2 tablets Oral Given 04/24/17 1515)  insulin glargine (LANTUS) injection 25 Units (25 Units Subcutaneous Given 04/24/17 1527)  pantoprazole (PROTONIX) EC tablet 40 mg (40 mg Oral Given 04/24/17 1515)  potassium chloride SA (K-DUR,KLOR-CON) CR tablet 20 mEq (20 mEq Oral Given 04/24/17 1515)  pregabalin (LYRICA) capsule 100 mg (100 mg Oral Given 04/24/17 1515)  roflumilast (DALIRESP) tablet 500 mcg (500 mcg Oral Given 04/24/17 1530)  tiotropium (SPIRIVA) inhalation capsule 18 mcg (not administered)  torsemide (DEMADEX) tablet 20 mg (20 mg Oral Given 04/24/17 1515)  traMADol (ULTRAM) tablet 50 mg (not administered)  enoxaparin (LOVENOX) injection 40 mg (40 mg Subcutaneous Not Given 04/24/17 1517)  sodium chloride flush (NS) 0.9 % injection 3 mL (not administered)  0.9 % NaCl with KCl 20 mEq/ L  infusion (  Intravenous New Bag/Given 04/24/17 1514)  sodium chloride flush (NS) 0.9 % injection 3 mL (not administered)  sodium chloride flush (NS) 0.9 % injection 3 mL (not administered)  0.9 %  sodium chloride infusion (not administered)  acetaminophen (TYLENOL) tablet 650 mg (not administered)    Or  acetaminophen (TYLENOL) suppository 650 mg (not administered)  senna-docusate (Senokot-S) tablet 1 tablet (not administered)  bisacodyl (DULCOLAX) EC tablet 5 mg (not administered)  ondansetron (ZOFRAN) tablet 4 mg (not administered)    Or  ondansetron (ZOFRAN) injection 4 mg (not administered)  albuterol (PROVENTIL) (2.5 MG/3ML) 0.083% nebulizer solution 2.5 mg (not administered)  ipratropium-albuterol (DUONEB) 0.5-2.5 (3) MG/3ML nebulizer solution 3 mL (3 mLs Nebulization Given 04/24/17 1930)  mometasone-formoterol (DULERA) 200-5 MCG/ACT inhaler 2 puff (2 puffs Inhalation Given 04/24/17 1930)  insulin aspart (novoLOG) injection 0-20 Units (20 Units Subcutaneous Given 04/24/17 1732)  insulin aspart (novoLOG) injection 0-5 Units (not administered)  insulin aspart (novoLOG) injection 6 Units (6 Units Subcutaneous Given 04/24/17 1733)  methylPREDNISolone sodium succinate (SOLU-MEDROL) 40 mg/mL injection 40 mg (40 mg Intravenous Given 04/24/17 1700)  albuterol (PROVENTIL,VENTOLIN) solution continuous neb (10 mg/hr Nebulization Given 04/24/17 1046)  ipratropium (ATROVENT) nebulizer solution 1 mg (1 mg Nebulization Given 04/24/17 1046)  methylPREDNISolone sodium succinate (SOLU-MEDROL) 125 mg/2 mL injection 125 mg (125 mg Intravenous Given 04/24/17 1030)  magnesium sulfate IVPB 2 g 50 mL (0 g  Intravenous Stopped 04/24/17 1203)     Initial Impression / Assessment and Plan / ED Course  I have reviewed the triage vital signs and the nursing notes.  Pertinent labs & imaging results that were available during my care of the patient were reviewed by me and considered in my medical decision making (see chart for  details).    41 yo F with h/o severe COPD, chronic hypoxic resp failure, chronic right-sided HF here with severe SOB, worsening fatigue, and AMS. On arrival, pt with tachycardia, diffuse wheezing and diminished aeration. CAT started with IV steroids and Mag. C/f decompensated COPD versus RHF with hypercarbia. Will check ABG, re-assess after breathing tx.  Pt seems improved after CAT. ABG with chronic CO2 retention but no need for BIPAP. Given continued increased WOB relative to baseline with diffuse wheezing, not documented on recent Pulm visit, will admit for ongoing COPD exacerbation management. Pt in agreement..  Final Clinical Impressions(s) / ED Diagnoses   Final diagnoses:  COPD exacerbation ([place])  Wheezing    New Prescriptions Current Discharge Medication List       Duffy Bruce, MD 04/24/17 2025

## 2017-04-24 NOTE — ED Notes (Signed)
Medications not verified by pharmacy

## 2017-04-24 NOTE — ED Triage Notes (Signed)
Pt arrives from home via [facility] reporting SOB, increased lethargy, "feeling off" since fall on Tuesday.  EMS reports giving 10 mg Albuterol. Pt able to speak in full sentences on neb tx.

## 2017-04-24 NOTE — Progress Notes (Signed)
New Admission Note:    Arrival Method: Stretcher from ER Mental Orientation: A&O x4 Assessment: In progress Skin: Intact IV: 20g RFA Pain: 0/10 Safety Measures: Side Rails up and call bell in reach  Admission: In progress 6E Orientation: Completed Family:  None  Orders have been reviewed and implemented and pt placed on telemetry Will continue to monitor the patient.

## 2017-04-24 NOTE — ED Notes (Signed)
ED Provider at bedside. 

## 2017-04-25 DIAGNOSIS — I50812 Chronic right heart failure: Secondary | ICD-10-CM

## 2017-04-25 DIAGNOSIS — G4733 Obstructive sleep apnea (adult) (pediatric): Secondary | ICD-10-CM

## 2017-04-25 DIAGNOSIS — E099 Drug or chemical induced diabetes mellitus without complications: Secondary | ICD-10-CM

## 2017-04-25 DIAGNOSIS — T380X5A Adverse effect of glucocorticoids and synthetic analogues, initial encounter: Secondary | ICD-10-CM

## 2017-04-25 LAB — RESPIRATORY PANEL BY PCR
Adenovirus: NOT DETECTED
BORDETELLA PERTUSSIS-RVPCR: NOT DETECTED
CORONAVIRUS HKU1-RVPPCR: NOT DETECTED
CORONAVIRUS NL63-RVPPCR: NOT DETECTED
Chlamydophila pneumoniae: NOT DETECTED
Coronavirus 229E: NOT DETECTED
Coronavirus OC43: NOT DETECTED
Influenza A: NOT DETECTED
Influenza B: NOT DETECTED
METAPNEUMOVIRUS-RVPPCR: NOT DETECTED
Mycoplasma pneumoniae: NOT DETECTED
PARAINFLUENZA VIRUS 2-RVPPCR: NOT DETECTED
PARAINFLUENZA VIRUS 3-RVPPCR: NOT DETECTED
Parainfluenza Virus 1: NOT DETECTED
Parainfluenza Virus 4: NOT DETECTED
RHINOVIRUS / ENTEROVIRUS - RVPPCR: NOT DETECTED
Respiratory Syncytial Virus: NOT DETECTED

## 2017-04-25 LAB — BASIC METABOLIC PANEL
ANION GAP: 9 (ref 5–15)
BUN: 9 mg/dL (ref 6–20)
CHLORIDE: 83 mmol/L — AB (ref 101–111)
CO2: 43 mmol/L — ABNORMAL HIGH (ref 22–32)
CREATININE: 0.53 mg/dL (ref 0.44–1.00)
Calcium: 8.7 mg/dL — ABNORMAL LOW (ref 8.9–10.3)
GFR calc non Af Amer: >60 mL/min (ref >60)
Glucose, Bld: 340 mg/dL — ABNORMAL HIGH (ref 65–99)
POTASSIUM: 4.2 mmol/L (ref 3.5–5.1)
SODIUM: 135 mmol/L (ref 135–145)

## 2017-04-25 LAB — GLUCOSE, CAPILLARY
GLUCOSE-CAPILLARY: 325 mg/dL — AB (ref 65–99)
Glucose-Capillary: 382 mg/dL — ABNORMAL HIGH (ref 65–99)
Glucose-Capillary: 258 mg/dL — ABNORMAL HIGH (ref 65–99)
Glucose-Capillary: 298 mg/dL — ABNORMAL HIGH (ref 65–99)

## 2017-04-25 LAB — CBC
HCT: 44.3 % (ref 36.0–46.0)
Hemoglobin: 13.3 g/dL (ref 12.0–15.0)
MCH: 31.2 pg (ref 26.0–34.0)
MCHC: 30 g/dL (ref 30.0–36.0)
MCV: 104 fL — AB (ref 78.0–100.0)
PLATELETS: 130 10*3/uL — AB (ref 150–400)
RBC: 4.26 MIL/uL (ref 3.87–5.11)
RDW: 14.8 % (ref 11.5–15.5)
WBC: 9.1 10*3/uL (ref 4.0–10.5)

## 2017-04-25 LAB — HEMOGLOBIN A1C
HEMOGLOBIN A1C: 6.8 % — AB (ref 4.8–5.6)
Mean Plasma Glucose: 148 mg/dL

## 2017-04-25 LAB — INFLUENZA PANEL BY PCR (TYPE A & B)
INFLAPCR: NEGATIVE
Influenza B By PCR: NEGATIVE

## 2017-04-25 MED ORDER — GUAIFENESIN ER 600 MG PO TB12
1200.0000 mg | ORAL_TABLET | Freq: Two times a day (BID) | ORAL | Status: DC
  Administered 2017-04-25 – 2017-04-27 (×5): 1200 mg via ORAL
  Filled 2017-04-25 (×5): qty 2

## 2017-04-25 MED ORDER — METHYLPREDNISOLONE SODIUM SUCC 40 MG IJ SOLR
40.0000 mg | Freq: Two times a day (BID) | INTRAMUSCULAR | Status: AC
  Administered 2017-04-25: 40 mg via INTRAVENOUS
  Filled 2017-04-25: qty 1

## 2017-04-25 MED ORDER — PREDNISONE 20 MG PO TABS
40.0000 mg | ORAL_TABLET | Freq: Every day | ORAL | Status: DC
  Administered 2017-04-26 – 2017-04-27 (×2): 40 mg via ORAL
  Filled 2017-04-25 (×2): qty 2

## 2017-04-25 NOTE — Progress Notes (Signed)
Called spoke with patient's spouse Casimiro NeedleMichael - pt admitted as of yesterday 5/20 for worsening shortness of breath.  Sleep consult scheduled with Casimiro NeedleMichael for 05/06/17 w/ RA @ 2pm.  Consult paperwork and appt card mailed to verified home address.

## 2017-04-25 NOTE — Progress Notes (Signed)
PULMONARY / CRITICAL CARE MEDICINE   Name: Mary [name] MRN: [id number] DOB: October 26, 1976    ADMISSION DATE:  04/24/2017 CONSULTATION DATE:  04/24/17  REFERRING MD:  Feliz Beam MD  CHIEF COMPLAINT:  COPD exacerbation  HISTORY OF PRESENT ILLNESS:   41 year old with severe COPD on home oxygen, as chronic steroids, ongoing tobacco use, alcohol use, OSA on BiPAP 16/8 at home, pulm HTN with corpulmonale. 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 She's had recurrent COPD exacerbations over the past year with acute on chronic hypercapnic, hypoxic respiratory failure.  She returns with respiratory failure, worsening cough, dyspnea, drowsiness. She feels that her BiPAP was not working very well and has been having intermittent tremors in her arms or legs. Noted to have elevated PCO2 with compensated pH. She denies any fevers, chills, sick contacts. Admitted by hospitalist for management of hypercarbic respiratory failure, COPD exacerbation. PCCM consulted for further management.   SUBJECTIVE: Feeling better today. Concerned about her muscle twitching still. Breathing better though.    VITAL SIGNS: BP 128/81 (BP Location: Right Arm)   Pulse 83   Temp 97.7 F (36.5 C) (Axillary)   Resp 16   Ht 5' 2.5" (1.588 m)   Wt 93 kg (205 lb)   LMP 04/20/2017 (Approximate)   SpO2 91%   BMI 36.90 kg/m   HEMODYNAMICS:    VENTILATOR SETTINGS:    INTAKE / OUTPUT: I/O last 3 completed shifts: In: 1830 [P.O.:1080; I.V.:750] Out: 525 [Urine:525]  PHYSICAL EXAMINATION: General:  Obese female in NAD Neuro: Alert, oriented, non-focal HEENT:  [place]/AT, No JVD noted, PERRL. Hoarse voice Cardiovascular:  RRR, no MRG Lungs:  scant rales Abdomen:  Soft, non-distended, non-tender Musculoskeletal:  No acute deformity Skin:  Intact, MMM    LABS:  BMET  Recent Labs Lab 04/22/17 1153 04/24/17 0950 04/25/17 0532  NA 141 139 135  K 3.7 3.5 4.2  CL  90* 85* 83*  CO2 48* 45* 43*  BUN 14 <5* 9  CREATININE 0.54 0.56 0.53  GLUCOSE 165* 156* 340*    Electrolytes  Recent Labs Lab 04/22/17 1153 04/24/17 0950 04/25/17 0532  CALCIUM 9.3 8.8* 8.7*  MG  --  1.8  --     CBC  Recent Labs Lab 04/20/17 0009 04/24/17 0950 04/25/17 0532  WBC 9.6 9.6 9.1  HGB 14.2 14.1 13.3  HCT 46.2* 46.6* 44.3  PLT 149* 132* 130*    Coag's No results for input(s): APTT, INR in the last 168 hours.  Sepsis Markers  Recent Labs Lab 04/24/17 1810  PROCALCITON 0.15    ABG  Recent Labs Lab 04/20/17 0100 04/24/17 1053  PHART 7.303* 7.391  PCO2ART 92.8* 84.6*  PO2ART 54.5* 55.0*    Liver Enzymes  Recent Labs Lab 04/24/17 0950  AST 72*  ALT 113*  ALKPHOS 115  BILITOT 1.3*  ALBUMIN 3.6    Cardiac Enzymes  Recent Labs Lab 04/22/17 1153  PROBNP 129.0*    Glucose  Recent Labs Lab 04/24/17 1255 04/24/17 1713 04/24/17 2053 04/25/17 0721  GLUCAP 202* 388* 344* 382*    Imaging Dg Chest Portable 1 View  Result Date: 04/24/2017 CLINICAL DATA:  Pt arrives from home via [facility] reporting SOB, increased lethargy, "feeling off" since fall on Tuesday. Denies chest pain and cough. Hx of COPD, HTN, pneumonia. Patient shielded. EXAM: PORTABLE CHEST - 1 VIEW COMPARISON:  04/20/2017 FINDINGS: Lungs are clear. Resolution of the perihilar opacities described  previously. Heart size and mediastinal contours are within normal limits for technique and positioning. No definite effusion. Visualized bones unremarkable. IMPRESSION: No acute findings Electronically Signed   By: Corlis Leak  Hassell M.D.   On: 04/24/2017 10:25     STUDIES:  Pulmonary tests: A1AT 03/07/12 >> 340, MM PFTS 04/10/12 >> FEV1 0.71 (24%), FEV1% 48, TLC 4.21 (88%), DLCO 32%, + BD  Severe obstruction with trapping and DLCO impairment, + bronchodilator response  ABG 04/20/17 >> 7.3/93/55/85% ABG 04/24/17 >> 7.4/85/55/86%  CT high resolution 02/01/17 >> scattered scarring with  air trapping enlarged PA, right upper lobe groundglass opacity stable from 2016 Chest x-ray 04/24/17 >> no acute lung infiltrate I have reviewed all images personally.  Cardiac tests Echo 02/01/17>> LVEF 65-70 percent, grade 1 diastolic dysfunction. RV and RA is mildly dilated, mild TR with PAP 42 RHC 01/08/16 >> RA 11, RV 41/4/8, PA 45/16/35, PCWP 11, CI 2.4, PVR 5.3 WU  Sleept tests: PSG 04/04/12 >> AHI 5, SpO2 low 73%  CULTURES: RVP 5/20 >>  ANTIBIOTICS: None  SIGNIFICANT EVENTS: 5/20 Admit with hypercarbic failure  LINES/TUBES:   DISCUSSION: 41 year old with severe COPD, OSA, recurrent respiratory failure admitted with COPD exacerbation, hypercarbia, hypoxia. Continues to smoke and states that her last cigarette was a couple of spots a few days ago.  Her current admission to have been precipitated by a dysfunctional BiPAP machine. She does not give any symptoms of active infection.  - Use hospital BiPAP QHS - Assess ABG in AM on current BiPAP settings - Given her recurrent admissions with hypercarbia she will benefit from higher BiPAP settings or a trilogy ventilator - Respiratory viral panel pending - Continue standing duonebs and albuterol PRN. - Holding home symbicort, spiriva while on nebs, resume on discharge - Will transition steroids to prednisone. - Discussed smoking cessation  Joneen Roachaul Hoffman, AGACNP-BC [facility] Pager [phone] or [phone]  04/25/2017 10:01 AM   STAFF NOTE: Cindi CarbonI, Yehia Mcbain, MD FACP have personally reviewed patient's available data, including medical history, events of note, physical examination and test results as part of my evaluation. I have discussed with resident/NP and other care providers such as pharmacist, RN and RRT. In addition, I personally evaluated patient and elicited key findings of:  Awake, no distress, no stridor, likely with baseline air movement, , obese, pcxr without acute infiltrate  reviewed by me, she admits that breathing is at baseline, but she complains of muscle spasm of her upper ext and jittery and she is concerned about her co2 as cause, I have same concern that her nocturnal settings are not correcting her co2, will maintain 16/8 and assess abg at 5 am, may need increased ipap vs change in settings / mode, also reduce steroids to pred which also could be etiology of her spasm, her resp viral is pending still, unlikely to be clinically significant, dc inhalers, use nebs in hospital, I updatedf pt   Mcarthur RossettiDaniel J. Tyson AliasFeinstein, MD, FACP Pgr: [phone] [place] [facility] 04/25/2017 10:12 AM

## 2017-04-25 NOTE — Progress Notes (Signed)
[facility]  PROGRESS NOTE  Mary [name] NFA:[id number] DOB: Jun 15, 1976 DOA: 04/24/2017 PCP: Veryl Speak, FNP   Brief HPI:   41 y.o. female with medical history significant of  severe COPD (Dr. 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    Subjective   Patient seen and examined, says breathing has improved. Complains of muscle twitching today    Assessment/Plan:     1. COPD exacerbation 2. - improved, patient started on Solumedrol 40 mg IV q 12 hr,              - changed to Prednisone today per pulmonary.              -  As likely contributing to myoclonic jerks.              - Continue Duonebs Q 6 hrs. 3. Myoclonic jerks 4. - likely from elevated Co2.              - Pulmonary recommends to continue Bipap at same settings to see if it corrects Co2.               -ABG in am. 5. OSA             -continue Bipap,stable 6. Steroid induced DM              - blood glucose is elevated 258, 382. Hba1c is 6.8.               -continue sliding scale insulin, Lantus 25 units subcut daily. 7. Chronic CHF right side/diastolic           - euvolemic, continue Torsemide.           -  follow bmp in am.    DVT prophylaxis: Lovenox  Code Status: Full code   Family Communication: No family at bedside  Disposition Plan: Home when medically stable   Consultants:  Pulmonary  Procedures:  None   Continuous infusions . sodium  chloride    . 0.9 % NaCl with KCl 20 mEq / L 75 mL/hr at 04/25/17 0019      Antibiotics:   Anti-infectives    None       Objective   Vitals:   04/25/17 0034 04/25/17 0531 04/25/17 1000 04/25/17 1009  BP:  128/81 127/73   Pulse: 100 83 93   Resp: 17 16 18    Temp:  97.7 F (36.5 C) 98.2 F (36.8 C)   TempSrc:  Axillary Oral   SpO2: 91% 91% 98% (!) 89%  Weight:      Height:        Intake/Output Summary (Last 24 hours) at 04/25/17 1213 Last data filed at 04/25/17 1201  Gross per 24 hour  Intake          2721.25 ml  Output              775 ml  Net          1946.25 ml   Filed Weights   04/24/17 0959  Weight: 93  kg (205 lb)     Physical Examination:   Physical Exam: Eyes: No icterus, extraocular muscles intact  Mouth: Oral mucosa is moist, no lesions on palate,  Neck: Supple, no deformities, masses, or tenderness Lungs: Normal respiratory effort, scattered wheezing  Heart: Regular rate and rhythm, S1 and S2 normal, no murmurs, rubs auscultated Abdomen: BS normoactive,soft,nondistended,non-tender to palpation,no organomegaly Extremities: No pretibial edema, no erythema, no cyanosis, no clubbing Neuro : Alert and oriented to time, place and person, No focal deficits  Skin: No rashes seen on exam     Data Reviewed: I have personally reviewed following labs and imaging studies  CBG:  Recent Labs Lab 04/24/17 1255 04/24/17 1713 04/24/17 2053 04/25/17 0721 04/25/17 1206  GLUCAP 202* 388* 344* 382* 258*    CBC:  Recent Labs Lab 04/20/17 0009 04/24/17 0950 04/25/17 0532  WBC 9.6 9.6 9.1  NEUTROABS 8.1* 6.7  --   HGB 14.2 14.1 13.3  HCT 46.2* 46.6* 44.3  MCV 101.5* 103.8* 104.0*  PLT 149* 132* 130*    Basic Metabolic Panel:  Recent Labs Lab 04/20/17 0009 04/22/17 1153 04/24/17 0950 04/25/17 0532  NA 139 141 139 135  K 3.1* 3.7 3.5 4.2  CL 84* 90* 85* 83*  CO2 41* 48* 45* 43*  GLUCOSE 230* 165* 156* 340*  BUN 7 14 <5* 9   CREATININE 0.47 0.54 0.56 0.53  CALCIUM 9.3 9.3 8.8* 8.7*  MG  --   --  1.8  --     Recent Results (from the past 240 hour(s))  Respiratory Panel by PCR     Status: None   Collection Time: 04/24/17  8:41 PM  Result Value Ref Range Status   Adenovirus NOT DETECTED NOT DETECTED Final   Coronavirus 229E NOT DETECTED NOT DETECTED Final   Coronavirus HKU1 NOT DETECTED NOT DETECTED Final   Coronavirus NL63 NOT DETECTED NOT DETECTED Final   Coronavirus OC43 NOT DETECTED NOT DETECTED Final   Metapneumovirus NOT DETECTED NOT DETECTED Final   Rhinovirus / Enterovirus NOT DETECTED NOT DETECTED Final   Influenza A NOT DETECTED NOT DETECTED Final   Influenza B NOT DETECTED NOT DETECTED Final   Parainfluenza Virus 1 NOT DETECTED NOT DETECTED Final   Parainfluenza Virus 2 NOT DETECTED NOT DETECTED Final   Parainfluenza Virus 3 NOT DETECTED NOT DETECTED Final   Parainfluenza Virus 4 NOT DETECTED NOT DETECTED Final   Respiratory Syncytial Virus NOT DETECTED NOT DETECTED Final   Bordetella pertussis NOT DETECTED NOT DETECTED Final   Chlamydophila pneumoniae NOT DETECTED NOT DETECTED Final   Mycoplasma pneumoniae NOT DETECTED NOT DETECTED Final     Liver Function Tests:  Recent Labs Lab 04/24/17 0950  AST 72*  ALT 113*  ALKPHOS 115  BILITOT 1.3*  PROT 6.5  ALBUMIN 3.6   No results for input(s): LIPASE, AMYLASE in the last 168 hours. No results for input(s): AMMONIA in the last 168 hours.  Cardiac Enzymes: No results for input(s): CKTOTAL, CKMB, CKMBINDEX, TROPONINI in the last 168 hours. BNP (last 3 results)  Recent Labs  08/25/16 0713 02/09/17 0340 04/24/17 0950  BNP 23.4 104.2* 234.4*    ProBNP (last 3 results)  Recent Labs  04/22/17 1153  PROBNP 129.0*      Studies: Dg Chest Portable 1 View  Result Date: 04/24/2017 CLINICAL DATA:  Pt arrives from home via [facility] reporting SOB, increased lethargy, "feeling off" since fall on Tuesday. Denies chest pain and cough. Hx  of COPD, HTN, pneumonia. Patient shielded.  EXAM: PORTABLE CHEST - 1 VIEW COMPARISON:  04/20/2017 FINDINGS: Lungs are clear. Resolution of the perihilar opacities described previously. Heart size and mediastinal contours are within normal limits for technique and positioning. No definite effusion. Visualized bones unremarkable. IMPRESSION: No acute findings Electronically Signed   By: Corlis Leak  Hassell M.D.   On: 04/24/2017 10:25    Scheduled Meds: . enoxaparin (LOVENOX) injection  40 mg Subcutaneous Q24H  . gabapentin  600 mg Oral BID  . guaiFENesin  1,200 mg Oral BID  . insulin aspart  0-20 Units Subcutaneous TID WC  . insulin aspart  0-5 Units Subcutaneous QHS  . insulin aspart  6 Units Subcutaneous TID WC  . insulin glargine  25 Units Subcutaneous Daily  . ipratropium-albuterol  3 mL Nebulization Q6H  . methylPREDNISolone (SOLU-MEDROL) injection  40 mg Intravenous Q12H  . pantoprazole  40 mg Oral BID AC  . potassium chloride SA  20 mEq Oral Daily  . [START ON 04/26/2017] predniSONE  40 mg Oral Q breakfast  . pregabalin  100 mg Oral TID  . roflumilast  500 mcg Oral Daily  . sodium chloride flush  3 mL Intravenous Q12H  . sodium chloride flush  3 mL Intravenous Q12H  . torsemide  20 mg Oral Daily      Time spent: 25 min  [facility],Yassmin Binegar S   [facility] Pager [phone]. If 7PM-7AM, please contact night-coverage at [email or web address], Office  [phone]  password [email or web address] 04/25/2017, 12:13 PM  LOS: 1 day

## 2017-04-25 NOTE — Telephone Encounter (Signed)
Pt did in fact keep her appt with TP on 5/18. Will close this message

## 2017-04-25 NOTE — Evaluation (Signed)
Physical Therapy Evaluation Patient Details Name: Mary [name] MRN: [id number] DOB: 11/15/1976 TodJana [name]'s Date: 04/25/2017   History of Present Illness  41 y.o. female with medical history significant of  severe COPD (Dr. 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  Clinical Impression  Pt admitted with above diagnosis. Pt currently with functional limitations due to the deficits listed below (see PT Problem List). Presents with significantly decr functional capacity; In addition, painful sprained L ankle; we will try amb assistive devices: crutches versus RW versus Rollator; RW/Rollator may be our best best for energy conservation Pt will benefit from skilled PT to increase their independence and safety with mobility to allow discharge to the venue listed below.       Follow Up Recommendations Home health PT;Supervision - Intermittent    Equipment Recommendations  Rolling walker with 5" wheels;Other (comment) (will consider RW versus crutches; perhaps rollator for O2)    Recommendations for Other Services       Precautions / Restrictions Precautions Precaution Comments: Desats quickly with activity Required Braces or Orthoses: Other Brace/Splint Other Brace/Splint: Pt will have husban bring in "boot" doctors gave her after spraining her ankle      Mobility  Bed Mobility Overal bed mobility: Modified Independent                 Transfers Overall transfer level: Needs assistance Equipment used: Rolling walker (2 wheeled) Transfers: Sit to/from Stand Sit to Stand: Min guard         General transfer comment: MInguard for safety  Ambulation/Gait Ambulation/Gait assistance: Min guard;Min assist Ambulation Distance (Feet): 5 Feet Assistive device: Rolling walker (2 wheeled) Gait Pattern/deviations: Step-to pattern;Decreased stance time - left;Decreased step length - right     General Gait Details: Heavily dependent on RW for support and to unweigh L foot in stance due to pain; pretty shaky, even in RW  Stairs            Wheelchair Mobility    Modified Rankin (Stroke Patients Only)       Balance                                             Pertinent Vitals/Pain Pain Assessment: Faces Faces Pain Scale: Hurts little more Pain Location: L foot/ankle Pain Descriptors / Indicators: Aching Pain Intervention(s): Monitored during session    Home Living Family/patient expects to be discharged to:: Private residence Living Arrangements: Spouse/significant other Available Help at Discharge: Family;Available PRN/intermittently Type of Home: House Home Access: Stairs to enter Entrance Stairs-Rails: Right Entrance Stairs-Number of Steps: 3 Home Layout: One level 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lectric scooter;Wheelchair - manual Additional Comments: w/c doesn't have foot rests; uses motorized scooter at [employer]    Prior Function Level of Independence: Independent;Independent with assistive device(s)         Comments: home alone during the day     Hand Dominance        Extremity/Trunk Assessment  Upper Extremity Assessment Upper Extremity Assessment: Overall WFL for tasks assessed    Lower Extremity Assessment Lower Extremity Assessment: LLE deficits/detail LLE Deficits / Details: L ankle swollen and ecchycomotic; pt reports she sprained her ankle about 1 week prior        Communication   Communication: No difficulties  Cognition Arousal/Alertness: Awake/alert Behavior During Therapy: WFL for tasks assessed/performed Overall Cognitive Status: Within Functional Limits for tasks assessed                                        General Comments General comments (skin integrity, edema, etc.): Session conducted on 3 L supplemental O2, O2 sats decr as low as 85%; recovered quickly with rest    Exercises     Assessment/Plan    PT Assessment Patient needs continued PT services  PT Problem List Decreased strength;Decreased range of motion;Decreased activity tolerance;Decreased balance;Decreased mobility;Decreased knowledge of use of DME;Decreased knowledge of precautions;Cardiopulmonary status limiting activity;Pain       PT Treatment Interventions DME instruction;Gait training;Stair training;Functional mobility training;Therapeutic activities;Therapeutic exercise;Patient/family education    PT Goals (Current goals can be found in the Care Plan section)  Acute Rehab PT Goals Patient Stated Goal: ankle to heal PT Goal Formulation: With patient Time For Goal Achievement: 05/09/17 Potential to Achieve Goals: Good    Frequency Min 3X/week   Barriers to discharge Inaccessible home environment PT has difficulty managing O2 tank and going up/sown stairs    Co-evaluation               AM-PAC PT "6 Clicks" Daily Activity  Outcome Measure Difficulty turning over in bed (including adjusting bedclothes, sheets and blankets)?: None Difficulty moving from lying on back to sitting on the side of the bed? : None Difficulty sitting down on and standing up from a chair with arms (e.g., wheelchair, bedside commode, etc,.)?: A Lot Help needed moving to and from a bed to chair (including a wheelchair)?: A Little Help needed walking in hospital room?: A Little Help needed climbing 3-5 steps with a railing? : A Lot 6 Click Score: 18    End  of Session Equipment Utilized During Treatment: Gait belt;Oxygen Activity Tolerance: Patient tolerated treatment well Patient left: in chair;with call bell/phone within reach Nurse Communication: Mobility status PT Visit Diagnosis: Other (comment);Other abnormalities of gait and mobility (R26.89) (Decr functional capacity)    Time: 1610-9604 PT Time Calculation (min) (ACUTE ONLY): 12 min   Charges:   PT Evaluation $PT Eval Low Complexity: 1 Procedure     PT G Codes:        Van Clines, PT  Acute Rehabilitation Services Pager [phone] Office [phone]  Levi Aland 04/25/2017, 4:47 PM

## 2017-04-26 LAB — BLOOD GAS, ARTERIAL
Acid-Base Excess: 16.1 mmol/L — ABNORMAL HIGH (ref 0.0–2.0)
BICARBONATE: 42.1 mmol/L — AB (ref 20.0–28.0)
DRAWN BY: 36527
Delivery systems: POSITIVE
Expiratory PAP: 6
Inspiratory PAP: 18
MODE: POSITIVE
O2 Content: 3 L/min
O2 Saturation: 97.5 %
Patient temperature: 98.6
pCO2 arterial: 73.5 mmHg (ref 32.0–48.0)
pH, Arterial: 7.376 (ref 7.350–7.450)
pO2, Arterial: 91.4 mmHg (ref 83.0–108.0)

## 2017-04-26 LAB — BASIC METABOLIC PANEL
ANION GAP: 9 (ref 5–15)
BUN: 11 mg/dL (ref 6–20)
CO2: 42 mmol/L — ABNORMAL HIGH (ref 22–32)
Calcium: 9.1 mg/dL (ref 8.9–10.3)
Chloride: 85 mmol/L — ABNORMAL LOW (ref 101–111)
Creatinine, Ser: 0.53 mg/dL (ref 0.44–1.00)
GFR calc Af Amer: >60 mL/min (ref >60)
Glucose, Bld: 216 mg/dL — ABNORMAL HIGH (ref 65–99)
POTASSIUM: 4.5 mmol/L (ref 3.5–5.1)
SODIUM: 136 mmol/L (ref 135–145)

## 2017-04-26 LAB — PROCALCITONIN: Procalcitonin: <0.1 ng/mL

## 2017-04-26 LAB — GLUCOSE, CAPILLARY
GLUCOSE-CAPILLARY: 246 mg/dL — AB (ref 65–99)
Glucose-Capillary: 207 mg/dL — ABNORMAL HIGH (ref 65–99)
Glucose-Capillary: 449 mg/dL — ABNORMAL HIGH (ref 65–99)
Glucose-Capillary: 320 mg/dL — ABNORMAL HIGH (ref 65–99)

## 2017-04-26 MED ORDER — IPRATROPIUM-ALBUTEROL 0.5-2.5 (3) MG/3ML IN SOLN
3.0000 mL | Freq: Three times a day (TID) | RESPIRATORY_TRACT | Status: DC
  Administered 2017-04-26 – 2017-04-27 (×5): 3 mL via RESPIRATORY_TRACT
  Filled 2017-04-26 (×5): qty 3

## 2017-04-26 NOTE — Plan of Care (Signed)
Problem: Pain Managment: Goal: General experience of comfort will improve Outcome: Progressing PM shift pt. took  1 pain pill instead of 2. POC reviewed with pt.

## 2017-04-26 NOTE — Progress Notes (Signed)
CRITICAL VALUE ALERT  Critical Value: CBG:449  Date & Time Notied: 04/26/17 1635  Provider Notified: Sharl MaLama  Orders Received/Actions taken: MD stated to give 20 units. Orders followed. Will continue to monitor.

## 2017-04-26 NOTE — Progress Notes (Signed)
Respiratory panel was negative. Sharl MaLama, MD gave verbal order to D/C droplet precautions.

## 2017-04-26 NOTE — Care Management Note (Signed)
Case Management Note  Patient Details  Name: Mary [name] MRN: [id number] Date of Birth: [birth year]  Subjective/Objective:       CM following for progression and d/c planning.              Action/Plan: 04/26/2017 Met with pt re d/c plan, pt hoping that she will not be d/c as she states that she does not feel comfortable going home due to her oxygen needs and her ankle injury. This CM discussed ongoing rehab as an options and the pt expressed no interest. HH also discuss and can be arranged. This CM also spoke with PT Talbert Nan re pt needs. Earnest Bailey will see this pt again today to eval. Per recommendation Rollator walker ordered and [facility] notified.  Pt with oxygen at home from [facility] pt uses 3l/min. Pt also concerned about her in home BiPap which is not working correctly, pt states that she has spoke to [facility] about this and was told that she can only get a new one q63yr Per pt records she reports that this BiPap was given to her on April 04, 2012, therefore is 40yr old. This CM contacted [place] [facility] D[facility]ch Penny, [facility] who will followup on this issue. Pt very hesitant to go home without a working BiPap.    Expected Discharge Date:                  [place]:  [place] Ref[place]:  NA  Discharge planning Services  CM Consult  Post Acute Care Choice:  Durable Medical Equipment, Home Health Choice offered to:  Patient  DME Arranged:  Walker rolling with seat, Bipap DME Agency:  AWinfredArranged:  RN, PT, OT [facility]  [place] Status of Service:  In process, will continue to follow  If discussed at Long Length of Stay Meetings, dates discussed:    Additional Comments:  RAdron Bene RN 04/26/2017, 10:31 AM

## 2017-04-26 NOTE — Progress Notes (Signed)
Patient places self on and off CPAP, RT made pt aware that if she needed any help to call.

## 2017-04-26 NOTE — Progress Notes (Signed)
[facility]  PROGRESS NOTE  Mary [name]:[id number] DOB: June 16, 1976 DOA: 04/24/2017 PCP: Veryl Speak, FNP   Brief HPI:   41 y.o. female with medical history significant of  severe COPD (Dr. 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    Subjective   Patient seen and examined, says breathing has improved. Complains of muscle twitching today    Assessment/Plan:     1. COPD exacerbation               - improved, patient started on Solumedrol 40 mg IV q 12 hr,              - changed to Prednisone t per pulmonary.              -  As likely contributing to myoclonic jerks.              - Continue Duonebs Q 6 hrs.              - pulmonary medicine has signed off. 2. Myoclonic jerks- resolved              - likely from elevated Co2.              - Pulmonary recommended to continue Bipap at same settings to see if it corrects Co2.               -ABG this morning showed pCo2 73.5, at patients baseline. 3. OSA             -continue Bipap,stable 4. Steroid induced DM              - blood glucose is elevated 246,207 . Hba1c is 6.8.               -continue sliding scale insulin, Lantus 25 units subcut daily. 5. Chronic CHF right side/diastolic           - euvolemic, continue Torsemide.           -  follow bmp in am.    DVT prophylaxis: Lovenox  Code Status: Full code   Family Communication: No family at bedside  Disposition  Plan: patient has Bipap at home , which is not functioning well. CM is working with [facility] to set up new Bipap at home. Will stay in hospital till new Bipap arrives at home.   Consultants:  Pulmonary  Procedures:  None   Continuous infusions . sodium chloride        Antibiotics:   Anti-infectives    None       Objective   Vitals:   04/26/17 0506 04/26/17 0820 04/26/17 1000 04/26/17 1319  BP: 115/67  100/63   Pulse: 64 78 64 98  Resp: 18 18 18 18   Temp: 97.8 F (36.6 C)  98 F (36.7 C)   TempSrc: Axillary  Oral   SpO2: 96% 98% 96% 95%  Weight:      Height:        Intake/Output Summary (Last 24 hours) at 04/26/17 1541  Last data filed at 04/26/17 1535  Gross per 24 hour  Intake             1203 ml  Output             1500 ml  Net             -297 ml   Filed Weights   04/24/17 0959 04/25/17 2230  Weight: 93 kg (205 lb) 94.6 kg (208 lb 9.6 oz)     Physical Examination:  Physical Exam: Eyes: No icterus, extraocular muscles intact  Mouth: Oral mucosa is moist, no lesions on palate,  Neck: Supple, no deformities, masses, or tenderness Lungs: Normal respiratory effort, scattered wheezing  Heart: Regular rate and rhythm, S1 and S2 normal, no murmurs, rubs auscultated Abdomen: BS normoactive,soft,nondistended,non-tender to palpation,no organomegaly Extremities: No pretibial edema, no erythema, no cyanosis, no clubbing Neuro : Alert and oriented to time, place and person, No focal deficits Skin: No rashes seen on exam    Data Reviewed: I have personally reviewed following labs and imaging studies  CBG:  Recent Labs Lab 04/25/17 1206 04/25/17 1646 04/25/17 2227 04/26/17 0742 04/26/17 1220  GLUCAP 258* 325* 298* 207* 246*    CBC:  Recent Labs Lab 04/20/17 0009 04/24/17 0950 04/25/17 0532  WBC 9.6 9.6 9.1  NEUTROABS 8.1* 6.7  --   HGB 14.2 14.1 13.3  HCT 46.2* 46.6* 44.3  MCV 101.5* 103.8* 104.0*  PLT 149* 132* 130*     Basic Metabolic Panel:  Recent Labs Lab 04/20/17 0009 04/22/17 1153 04/24/17 0950 04/25/17 0532 04/26/17 0740  NA 139 141 139 135 136  K 3.1* 3.7 3.5 4.2 4.5  CL 84* 90* 85* 83* 85*  CO2 41* 48* 45* 43* 42*  GLUCOSE 230* 165* 156* 340* 216*  BUN 7 14 <5* 9 11  CREATININE 0.47 0.54 0.56 0.53 0.53  CALCIUM 9.3 9.3 8.8* 8.7* 9.1  MG  --   --  1.8  --   --     Recent Results (from the past 240 hour(s))  Respiratory Panel by PCR     Status: None   Collection Time: 04/24/17  8:41 PM  Result Value Ref Range Status   Adenovirus NOT DETECTED NOT DETECTED Final   Coronavirus 229E NOT DETECTED NOT DETECTED Final   Coronavirus HKU1 NOT DETECTED NOT DETECTED Final   Coronavirus NL63 NOT DETECTED NOT DETECTED Final   Coronavirus OC43 NOT DETECTED NOT DETECTED Final   Metapneumovirus NOT DETECTED NOT DETECTED Final   Rhinovirus / Enterovirus NOT DETECTED NOT DETECTED Final   Influenza A NOT DETECTED NOT DETECTED Final   Influenza B NOT DETECTED NOT DETECTED Final   Parainfluenza Virus 1 NOT DETECTED NOT DETECTED Final   Parainfluenza Virus 2 NOT DETECTED NOT DETECTED Final   Parainfluenza Virus 3 NOT DETECTED NOT DETECTED Final   Parainfluenza Virus 4 NOT DETECTED NOT DETECTED Final   Respiratory Syncytial Virus NOT DETECTED NOT DETECTED Final   Bordetella pertussis NOT DETECTED NOT DETECTED Final   Chlamydophila pneumoniae NOT DETECTED NOT DETECTED Final   Mycoplasma pneumoniae NOT DETECTED NOT DETECTED Final     Liver Function Tests:  Recent Labs Lab 04/24/17 0950  AST 72*  ALT 113*  ALKPHOS 115  BILITOT 1.3*  PROT 6.5  ALBUMIN 3.6   No results for input(s): LIPASE, AMYLASE in the last 168 hours. No results for input(s): AMMONIA in the last 168 hours.  Cardiac Enzymes: No results for input(s): CKTOTAL, CKMB, CKMBINDEX,  TROPONINI in the last 168 hours. BNP (last 3 results)  Recent Labs  08/25/16 0713 02/09/17 0340 04/24/17 0950  BNP 23.4 104.2* 234.4*     ProBNP (last 3 results)  Recent Labs  04/22/17 1153  PROBNP 129.0*      Studies: No results found.  Scheduled Meds: . enoxaparin (LOVENOX) injection  40 mg Subcutaneous Q24H  . gabapentin  600 mg Oral BID  . guaiFENesin  1,200 mg Oral BID  . insulin aspart  0-20 Units Subcutaneous TID WC  . insulin aspart  0-5 Units Subcutaneous QHS  . insulin aspart  6 Units Subcutaneous TID WC  . insulin glargine  25 Units Subcutaneous Daily  . ipratropium-albuterol  3 mL Nebulization TID  . pantoprazole  40 mg Oral BID AC  . potassium chloride SA  20 mEq Oral Daily  . predniSONE  40 mg Oral Q breakfast  . pregabalin  100 mg Oral TID  . roflumilast  500 mcg Oral Daily  . sodium chloride flush  3 mL Intravenous Q12H  . sodium chloride flush  3 mL Intravenous Q12H  . torsemide  20 mg Oral Daily      Time spent: 25 min  [facility],Delinda Malan S   [facility] Pager [phone]. If 7PM-7AM, please contact night-coverage at [email or web address], Office  [phone]  password [email or web address] 04/26/2017, 3:41 PM  LOS: 2 days

## 2017-04-26 NOTE — Progress Notes (Signed)
CRITICAL VALUE ALERT  Critical value received: ABG- CO2 73 Date of notification: 04/26/17 Time of notification: 0435 Critical value read back:Yes.    Nurse who received alert: Leandrew KoyanagiJoyce G. Stevana Dufner,RN MD notified (1st page):  Kirtland BouchardK. Schorr,NP Time of first page:  0440 MD notified (2nd page): Critical Care- Dr. Levada SchillingSummers  Time of second page: 0505  Responding MD:  Schorr- [phone]/ Levada SchillingSummers- [phone] Time MD responded:  N/a- no orders

## 2017-04-26 NOTE — Progress Notes (Signed)
Went to place patient on CPAP, pt was off but stated she is able to place herself on and off. She stated she was just on and had taken herself off to go to the bedside toilet. RT will continue to monitor.

## 2017-04-26 NOTE — Progress Notes (Addendum)
PULMONARY / CRITICAL CARE MEDICINE   Name: Mary [name] MRN: [id number] DOB: Aug 21, 1976    ADMISSION DATE:  04/24/2017 CONSULTATION DATE:  04/24/17  REFERRING MD:  Feliz Beam MD  CHIEF COMPLAINT:  COPD exacerbation  HISTORY OF PRESENT ILLNESS:   41 year old with severe COPD on home oxygen, on chronic steroids, ongoing tobacco use, alcohol use, OSA on BiPAP 16/8 at home, pulm HTN with corpulmonale. 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 She's had recurrent COPD exacerbations over the past year with acute on chronic hypercapnic, hypoxic respiratory failure.  She returns with respiratory failure, worsening cough, dyspnea, drowsiness. She feels that her BiPAP was not working very well and has been having intermittent tremors in her arms or legs. Noted to have elevated PCO2 with compensated pH. She denies any fevers, chills, sick contacts. Admitted by hospitalist for management of hypercarbic respiratory failure, COPD exacerbation. PCCM consulted for further management.   SUBJECTIVE: Feeling much better. Symptoms improved. ABG this AM on BiPAP wnl.   VITAL SIGNS: BP 100/63 (BP Location: Right Arm)   Pulse 64   Temp 98 F (36.7 C) (Oral)   Resp 18   Ht 5' 2.5" (1.588 m)   Wt 94.6 kg (208 lb 9.6 oz)   LMP 04/20/2017 (Approximate)   SpO2 96%   BMI 37.55 kg/m   HEMODYNAMICS:    VENTILATOR SETTINGS:    INTAKE / OUTPUT: I/O last 3 completed shifts: In: 2961.3 [P.O.:2040; I.V.:921.3] Out: 2325 [Urine:2325]  PHYSICAL EXAMINATION: General:  Obese female in NAD Neuro: Alert, oriented, non-focal HEENT:  [place]/AT, No JVD noted, PERRL. Hoarse voice Cardiovascular:  RRR, no MRG Lungs:  scant rales Abdomen:  Soft, non-distended, non-tender Musculoskeletal:  No acute deformity Skin:  Intact, MMM    LABS:  BMET  Recent Labs Lab 04/24/17 0950 04/25/17 0532 04/26/17 0740  NA 139 135 136  K 3.5 4.2 4.5  CL 85* 83* 85*  CO2  45* 43* 42*  BUN <5* 9 11  CREATININE 0.56 0.53 0.53  GLUCOSE 156* 340* 216*    Electrolytes  Recent Labs Lab 04/24/17 0950 04/25/17 0532 04/26/17 0740  CALCIUM 8.8* 8.7* 9.1  MG 1.8  --   --     CBC  Recent Labs Lab 04/20/17 0009 04/24/17 0950 04/25/17 0532  WBC 9.6 9.6 9.1  HGB 14.2 14.1 13.3  HCT 46.2* 46.6* 44.3  PLT 149* 132* 130*    Coag's No results for input(s): APTT, INR in the last 168 hours.  Sepsis Markers  Recent Labs Lab 04/24/17 1810  PROCALCITON 0.15    ABG  Recent Labs Lab 04/20/17 0100 04/24/17 1053 04/26/17 0425  PHART 7.303* 7.391 7.376  PCO2ART 92.8* 84.6* 73.5*  PO2ART 54.5* 55.0* 91.4    Liver Enzymes  Recent Labs Lab 04/24/17 0950  AST 72*  ALT 113*  ALKPHOS 115  BILITOT 1.3*  ALBUMIN 3.6    Cardiac Enzymes  Recent Labs Lab 04/22/17 1153  PROBNP 129.0*    Glucose  Recent Labs Lab 04/24/17 2053 04/25/17 0721 04/25/17 1206 04/25/17 1646 04/25/17 2227 04/26/17 0742  GLUCAP 344* 382* 258* 325* 298* 207*    Imaging No results found.   STUDIES:  Pulmonary tests: A1AT 03/07/12 >> 340, MM PFTS 04/10/12 >> FEV1 0.71 (24%), FEV1% 48, TLC 4.21 (88%), DLCO 32%, + BD  Severe obstruction with trapping and DLCO impairment, + bronchodilator response  ABG 04/20/17 >> 7.3/93/55/85% ABG 04/24/17 >>  7.4/85/55/86%  CT high resolution 02/01/17 >> scattered scarring with air trapping enlarged PA, right upper lobe groundglass opacity stable from 2016 Chest x-ray 04/24/17 >> no acute lung infiltrate I have reviewed all images personally.  Cardiac tests Echo 02/01/17>> LVEF 65-70 percent, grade 1 diastolic dysfunction. RV and RA is mildly dilated, mild TR with PAP 42 RHC 01/08/16 >> RA 11, RV 41/4/8, PA 45/16/35, PCWP 11, CI 2.4, PVR 5.3 WU  Sleept tests: PSG 04/04/12 >> AHI 5, SpO2 low 73%  CULTURES: RVP 5/20 >>  ANTIBIOTICS: None  SIGNIFICANT EVENTS: 5/20 Admit with hypercarbic  failure  LINES/TUBES:   DISCUSSION: 41 year old with severe COPD, OSA, recurrent respiratory failure admitted with COPD exacerbation, hypercarbia, hypoxia. Continues to smoke and states that her last cigarette was a couple of puffs a few days ago. Seems as though this is largely decompensated OHS/OSA due to a defective BiPAP machine. On our BiPAP machine with her home settings her AM ABG is fine.   - Use hospital BiPAP QHS at home settings 16/8 - Care management working to obtain new home BiPAP per patient.  - Respiratory viral panel neg - Continue standing duonebs and albuterol PRN. - Holding home symbicort, spiriva while on nebs, resume on discharge - Steroids now prednisone. Plan for quick taper down to chronic dose on 5 mg.  - Will arrange pulmonary follow up - Need to ensure new BiPAP at home is arranged prior to discharge. - Discussed smoking cessation  PCCM will sign off.   Joneen RoachPaul Hoffman, AGACNP-BC [facility] Pager [phone] or [phone]  04/26/2017 10:37 AM   STAFF NOTE: Cindi CarbonI, Niquan Charnley, MD FACP have personally reviewed patient's available data, including medical history, events of note, physical examination and test results as part of my evaluation. I have discussed with resident/NP and other care providers such as pharmacist, RN and RRT. In addition, I personally evaluated patient and elicited key findings of: awake, alert, no distress, no wheezing, hoarse voice chronic, no stridor, no new pcxr, ABG in BIPAP all WNL, unlikely cause of the now resolved mycolonus  / tremor, so would maintain 16/8 bipap settings, would reduce steroids, NO role ABX, pct neg, resolving coarse exacerbation, wil sign off call if needed She expresss a concern that her home BIPAP not working well, this is possible,m social work to help us with new one  Mcarthur RossettiDaniel J. Tyson AliasFeinstein, MD, FACP Pgr: [phone] [facility] 04/26/2017 1:06 PM

## 2017-04-26 NOTE — Progress Notes (Signed)
eLink Physician-Brief Progress Note Patient Name: Mary HakimMichelle L [name] DOB: 06/07/1976 MRN: [id number]   Date of Service  04/26/2017  HPI/Events of Note  Called d/t ABG = 7.376/73/91.4. This patient is chronically hypoxic and hypercarbic. Her pH is normal, therefore, her hypercarbia is metabolically compensated.   eICU Interventions  Continue present management.      Intervention Category Major Interventions: Acid-Base disturbance - evaluation and management  [name] 04/26/2017, 5:26 AM

## 2017-04-26 NOTE — Progress Notes (Signed)
Physical Therapy Treatment Patient Details Name: Mary [name] MRN: [id number] DOB: [birth year] Today's Date: 04/26/2017    History of Present Illness 41 y.o. female with medical history significant of  severe COPD (Dr. 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    PT Comments    Continuing work on functional mobility and activity tolerance;  Much improved gait today, with noted much less shakiness than yesterday; Educated pt on Rest, Ice, Compression, Elevation management of her ankle; distraught about situation re: getting a BiPap at home; provided emotional support  Follow Up Recommendations  Home health PT;Supervision - Intermittent     Equipment Recommendations  Other (comment) Aeronautical engineer)    Recommendations for Other Services       Precautions / Restrictions Precautions Precaution Comments: Desats quickly with activity Required Braces or Orthoses: Other Brace/Splint Other Brace/Splint: Pt will have husban bring in "boot" doctors gave her after spraining her ankle; he hasn't brought it in yet, so [facility] wrapped it    Mobility  Bed Mobility Overal bed mobility: Modified Independent                Transfers Overall transfer level: Needs assistance Equipment used: 4-wheeled walker Transfers: Sit to/from Stand Sit to Stand: Supervision         General transfer comment: MInguard for safety  Ambulation/Gait Ambulation/Gait  assistance: Min guard;Supervision Ambulation Distance (Feet): 150 Feet Assistive device: 4-wheeled walker;None Gait Pattern/deviations: Step-through pattern     General Gait Details: Gait pattern normalizing; Rollator useful fo renergy conservation; ankle much better and much less shaky today   Stairs            Wheelchair Mobility    Modified Rankin (Stroke Patients Only)       Balance                                            Cognition Arousal/Alertness: Awake/alert Behavior During Therapy: WFL for tasks assessed/performed Overall Cognitive Status: Within Functional Limits for tasks assessed                                        Exercises      General Comments General comments (skin integrity, edema, etc.): Session conducted on 3 L supplemental O2, O2 sats decr as low as 85%; recovered quickly with rest      Pertinent Vitals/Pain Pain Assessment: Faces Faces Pain Scale: Hurts a little bit Pain Location: L foot/ankle Pain Descriptors / Indicators: Aching Pain Intervention(s): Monitored during session    Home Living                      Prior Function            PT Goals (current goals can now be found in the care plan section) Acute Rehab PT Goals Patient Stated Goal: ankle to  heal; be able to get new CPAP PT Goal Formulation: With patient Time For Goal Achievement: 05/09/17 Potential to Achieve Goals: Good Progress towards PT goals: Progressing toward goals    Frequency    Min 3X/week      PT Plan Current plan remains appropriate    Co-evaluation              AM-PAC PT "6 Clicks" Daily Activity  Outcome Measure  Difficulty turning over in bed (including adjusting bedclothes, sheets and blankets)?: None Difficulty moving from lying on back to sitting on the side of the bed? : None Difficulty sitting down on and standing up from a chair with arms (e.g., wheelchair, bedside commode,  etc,.)?: None Help needed moving to and from a bed to chair (including a wheelchair)?: None Help needed walking in hospital room?: None Help needed climbing 3-5 steps with a railing? : A Little 6 Click Score: 23    End of Session Equipment Utilized During Treatment: Gait belt;Oxygen Activity Tolerance: Patient tolerated treatment well Patient left: in chair;with call bell/phone within reach Nurse Communication: Mobility status PT Visit Diagnosis: Other (comment);Other abnormalities of gait and mobility (R26.89) (Decr functional capacity)     Time: 1140-1210 (Times are approximate) PT Time Calculation (min) (ACUTE ONLY): 30 min  Charges:  $Gait Training: 23-37 mins                    G Codes:       Van ClinesHolly Liboria Putnam, PT  Acute Rehabilitation Services Pager [phone] Office [phone]    Levi AlandHolly H Damarea Merkel 04/26/2017, 4:20 PM

## 2017-04-26 NOTE — Care Management Note (Addendum)
Case Management Note  Patient Details  Name: Mary HakimMichelle L [name] MRN: [id number] Date of Birth: 08/15/1976  Subjective/Objective:                    Action/Plan: Ongoing [facility]. Lupita LeashDonna has been a great help [facility] "broken " [facility]ap and exploring the possibility of obtaining a new machine as the old one was issued 04/04/2012.  4pm Current status, [facility] will not provide a new BiPap until the pt has paid $300 of past due copays for the current machine. The pt has not been able to provide them with a credit or debit card. In order to obtain a new machine they are currently asking for a new sleep study as well as bringing the account up to date.  Pt husband is at work , [facility] and asked to bring the [facility]ap or [facility] to be [facility]. This pt has no transportation when her husband is at work to bring this machine in herself. Pt nor husband have the resources to bring the account up to date. Pt can not d/c to home if no BiPap is available as this surely will result in another admission.  Going home and awaiting a Sleep Study is not an option as she would have no BiPap in the interim .  Going home with a broken BiPap is not an option.  This CM placed call to Medical Director, Dr Quillian Quince Aronson to seek his inpt.  Dr Sharl MaLama informed of status and inability to d/c this pt today.   Expected Discharge Date:                  Expected Discharge Plan:  Home w Home Health Services  In-House Referral:  NA  Discharge planning Services  CM Consult  Post Acute Care Choice:  Durable Medical Equipment, Home Health Choice offered to:  Patient  DME Arranged:  Walker rolling with seat, Bipap DME Agency:  [facility]  HH Arranged[facility]  [facility], PT, [facility]  [facility]  Status of Service:  In process, will continue to follow  If discussed at Long Length of Stay Meetings, dates discussed:     Additional Comments:  Starlyn SkeansRoyal, Hargun Spurling U, RN 04/26/2017, 3:49 PM

## 2017-04-27 ENCOUNTER — Telehealth: Payer: Self-pay | Admitting: Adult Health

## 2017-04-27 ENCOUNTER — Other Ambulatory Visit (HOSPITAL_BASED_OUTPATIENT_CLINIC_OR_DEPARTMENT_OTHER): Payer: Self-pay

## 2017-04-27 DIAGNOSIS — J9611 Chronic respiratory failure with hypoxia: Secondary | ICD-10-CM

## 2017-04-27 DIAGNOSIS — G4733 Obstructive sleep apnea (adult) (pediatric): Secondary | ICD-10-CM

## 2017-04-27 LAB — GLUCOSE, CAPILLARY
Glucose-Capillary: 198 mg/dL — ABNORMAL HIGH (ref 65–99)
Glucose-Capillary: 238 mg/dL — ABNORMAL HIGH (ref 65–99)
Glucose-Capillary: 283 mg/dL — ABNORMAL HIGH (ref 65–99)

## 2017-04-27 MED ORDER — PREDNISONE 5 MG PO TABS: 5.0000 mg | ORAL_TABLET | Freq: Every day | ORAL | Status: DC

## 2017-04-27 NOTE — Telephone Encounter (Signed)
Spoke with Melissa/[facility], states that the patient declined ONO on BiPAP with O2 Pt did not want to put her credit card on file and did not want to pick up ONO machine Pt is also in collections with [facility] Pt was advised that they do not charge the ONO but they have to have some sort of collateral in the event that the machine is not returned. Pt was also advised that she needed to come in for teaching of how to use the ONO machine and she refused.   Called patient, unable to leave voicemail. WCB

## 2017-04-27 NOTE — Progress Notes (Signed)
Patient refuses bed alarm and safety plan. Instructed to use call bell  

## 2017-04-27 NOTE — Progress Notes (Addendum)
Patient was discharged to home. IV and tele were discontinued. AVS reviewed with patients, follow up appointment and medications reviewed. Patient was concerned that she did not have a prescription for prednisone to taper back to home dose. MD and pharmacy were notified.  Instructed to wait until it was confirmed that Bipap would be delivered this evening before discharging patient. While reviewing AVS, patient stated she was expecting to have a tank of 02 delivered to her room to go home with. Contacted ED caseworker to find patient an 02 tank, patient became very agitated and began yelling while waiting. ED caseworker was able to find patient and 02 tank, patient still trying to find a ride at the end of shift.

## 2017-04-27 NOTE — Care Management Note (Addendum)
Case Management Note  Patient Details  Name: Mary HakimMichelle L [name] MRN: [id number] Date of Birth: 03/12/1976  Subjective/Objective:       CM continuing to work on BiPap for this pt .             Action/Plan: 04/27/2017 Message from  Medical director, Dr Quillian Quince Aronson last pm and spoke with Dr A this am re process for this pt. Per Dr Jacky KindleAronson, this CM will need to contact AD of CSW, Wandra MannanZack Brooks who will provide a letter of guarantee for a BiPap for 30 days, giving the pt time to obtain a new sleep study, and make [facility] arrangements [facility] needs.  Will not be able to get a rollator or services for this pt at home due to outstanding balances. This pt is ambulatory and was hesitant to use the walker so she will be able to manage without the walker if needed.   Expected Discharge Date:                  Expected Discharge Plan:  Home w Home Health Services  In-House Referral:  NA  Discharge planning Services  CM Consult  Post Acute Care Choice:  Durable Medical Equipment, Home Health Choice offered to:  Patient  DME Arranged:  BiPap DME Agency:  [facility]  HH Arranged:  HHRN, HHPT, HHOT, HH aide,  HH Agency:  [facility]  Status of Service:  In process, will continue to follow  If discussed at Long Length of Stay Meetings, dates discussed:    Additional Comments:  Starlyn SkeansRoyal, Deyon Chizek U, RN 04/27/2017, 9:56 AM

## 2017-04-27 NOTE — Discharge Summary (Signed)
Physician Discharge Summary  PRAJNA VANDERPOOL GQQ:[id number] DOB: [birth year] DOA: 04/24/2017  PCP: [facility], FNP  Admit date: 04/24/2017 Discharge date: 04/27/2017  Admitted From:home Disposition: home with home care and new Bipap Recommendations for Outpatient Follow-up:  1. Follow up with PCP in 1-2 weeks 2. Please obtain BMP/CBC in one week  Home Health:yes Equipment/Devices:bipap Discharge Condition:stable CODE STATUS:full Diet recommendation:carb modified heart healthy diet  Brief/Interim Summary: 41 y.o.femalewith medical history significant of severe COPD (Dr. 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  #Acute COPD exacerbation: Patient clinically improved. Currently on 3 L of oxygen. Evaluated by pulmonologist recommended to switch to home dose of prednisone soon. Patient also with hyperglycemia. Plan to discharge with bronchodilators and low-dose prednisone. Recommended to follow up with PCP and pulmonologist.  #Obstructive sleep apnea: Problem with BiPAP at home. Discussed with the patient and case manager in detail. I ordered a sleep study in order to get BiPAP before discharge. Discussed with the case manager to arrange and follow-up the results with her pulmonologist. Patient has her own pulmonologist and they will follow up with the patient. Today, patient will be going home only when BiPAP machine arrives. Discussed with both  the case manager and patient's nurse.  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 Recommended to follow up with PCP.  #Chronic right-sided diastolic CHF: Continue torsemide. Outpatient follow-up. Low-salt diet.  #Chronic hypoxic respiratory failure: Stable. Discharging with 2 L of oxygen.  Discharge Diagnoses:  Principal Problem:   Acute exacerbation of chronic obstructive pulmonary disease (COPD) ([place]) Active Problems:   OSA on BiPAP   Chronic right-sided CHF (congestive heart failure) (HCC)   Pulmonary hypertension (HCC)   Hypokalemia   Obesity hypoventilation syndrome (HCC)   Morbid obesity (HCC)   Acute on chronic respiratory failure with hypoxia and hypercapnia (HCC)   Steroid-induced diabetes ([place])    Discharge Instructions  Discharge Instructions    Call MD for:  difficulty breathing, headache or visual disturbances    Complete by:  As directed    Call MD for:  extreme fatigue    Complete by:  As directed    Call MD for:  hives    Complete by:  As directed    Call MD for:  persistant dizziness or light-headedness    Complete by:  As directed    Call MD for:  persistant nausea and vomiting    Complete by:  As directed    Call MD for:  severe uncontrolled pain    Complete by:  As directed    Call MD for:  temperature >100.4    Complete by:  As directed    Diet - low sodium heart healthy    Complete by:  As directed    Diet Carb Modified    Complete by:  As directed    Discharge instructions    Complete by:  As directed    Please have sleep study and follow up with your pulmonologist.   For home use only DME oxygen  Complete by:  As directed    Mode or (Route):  Nasal cannula   Liters per Minute:  3   Frequency:  Continuous (stationary and portable oxygen unit needed)   Oxygen conserving device:  Yes   Oxygen delivery system:  Gas   Increase activity slowly    Complete by:  As directed       Allergies as of 04/27/2017   No Known Allergies     Medication List    TAKE these medications   albuterol 108 (90 Base) MCG/ACT inhaler Commonly known as:  PROVENTIL HFA;VENTOLIN HFA Inhale 2 puffs into the lungs every 6 (six) hours as needed for wheezing or shortness of breath.   budesonide-formoterol 160-4.5 MCG/ACT inhaler Commonly known as:  SYMBICORT Inhale 2 puffs into the lungs 2 (two) times daily.   gabapentin 300 MG capsule Commonly known as:  NEURONTIN TAKE 2 CAPSULES BY MOUTH 3 TIMES DAILY.   guaiFENesin 600 MG 12 hr tablet Commonly known as:  MUCINEX Take 1 tablet (600 mg total) by mouth 2 (two) times daily.   HYDROcodone-acetaminophen 5-325 MG tablet Commonly known as:  NORCO/VICODIN Take 1-2 tablets by mouth every 6 (six) hours as needed.   insulin glargine 100 UNIT/ML injection Commonly known as:  LANTUS Inject 0.25 mLs (25 Units total) into the skin daily.   insulin lispro 100 UNIT/ML injection Commonly known as:  HUMALOG Inject 0-0.15 mLs (0-15 Units total) into the skin 3 (three) times daily with meals. CBG < 70: Eat or drink something right away and recheck. CBG 70 - 120: 0 units CBG 121 - 150: 2 units CBG 151 - 200: 3 units CBG 201 - 250: 5 units CBG 251 - 300: 8 units CBG 301 - 350: 11 units CBG 351 - 400: 15 units CBG > 400: call MD.   ONE TOUCH ULTRA 2 w/Device Kit USE TO CHECK BLOOD SUGARS TWICE A DAY   pantoprazole 40 MG tablet Commonly known as:  PROTONIX Take 1 tablet (40 mg total) by mouth 2 (two) times daily before a meal. What changed:  when to take this   potassium chloride SA 20 MEQ tablet Commonly known as:  K-DUR,KLOR-CON Take 1 tablet (20 mEq total) by mouth daily.   predniSONE 5 MG tablet Commonly known as:  DELTASONE Resume 5 mg daily after you have completed the 51m tab taper   pregabalin 50 MG capsule Commonly known as:  LYRICA Take 2 capsules (100 mg total) by mouth 3 (three) times daily.   roflumilast 500 MCG Tabs  tablet Commonly known as:  DALIRESP Take 500 mcg by mouth daily.   Tiotropium Bromide Monohydrate 1.25 MCG/ACT Aers Commonly known as:  SPIRIVA RESPIMAT Inhale 2 puffs into the lungs daily.   torsemide 20 MG tablet Commonly known as:  DEMADEX Take 1 tablet (20 mg total) by mouth daily.   traMADol 50 MG tablet Commonly known as:  ULTRAM TAKE 1 TABLET EVERY 6 HOURS AS NEEDED FOR SEVERE PAIN            Durable Medical Equipment        Start     Ordered   04/27/17 1537  For home use only DME Bipap  Once    Comments:  BiPap settings 16/8.  Question Answer Comment  Bleed in oxygen (LPM) O2 @ 3l/min   Keep 02 saturation Greater than 92%   Inspiratory pressure OTHER SEE COMMENTS   Expiratory pressure 8      04/27/17 1541  04/27/17 0000  For home use only DME oxygen    Question Answer Comment  Mode or (Route) Nasal cannula   Liters per Minute 3   Frequency Continuous (stationary and portable oxygen unit needed)   Oxygen conserving device Yes   Oxygen delivery system Gas      04/27/17 1630   04/26/17 1029  For home use only DME 4 wheeled rolling walker with seat  Once    Comments:  Pt with left ankle injury, on continuous oxygen , with desats upon ambulation.  Question:  Patient needs a walker to treat with the following condition  Answer:  Weakness generalized   04/26/17 1030     Follow-up Information    [facility] Follow up.   Specialty:  Pulmonology Why:  They will call you with appointment Contact information: [place], [place], [place]. Schedule an appointment as soon as possible for a visit in 1 week(s).   Specialty:  Family Medicine Contact information: [address]  [place] [address] [phone]          No Known Allergies  Consultations: Pulmonologist  Procedures/Studies: BiPAP  Subjective: Seen and examined at bedside. Denied headache, dizziness, nausea,  vomiting, chest pain, shortness of breath. Waiting for BiPAP to go home.  Discharge Exam: Vitals:   04/27/17 0512 04/27/17 1000  BP: 116/81 108/77  Pulse: 79 70  Resp: 18 18  Temp: 97.8 F (36.6 C) 98.2 F (36.8 C)   Vitals:   04/27/17 0512 04/27/17 0735 04/27/17 1000 04/27/17 1508  BP: 116/81  108/77   Pulse: 79  70   Resp: 18  18   Temp: 97.8 F (36.6 C)  98.2 F (36.8 C)   TempSrc: Axillary  Oral   SpO2: 98% 98% 98% 91%  Weight:      Height:        General: Obese female, Pt is alert, awake, not in acute distress Cardiovascular: RRR, S1/S2 +, no rubs, no gallops Respiratory: CTA bilaterally, no wheezing, no rhonchi Abdominal: Soft, NT, ND, bowel sounds + Extremities: no edema, no cyanosis    The results of significant diagnostics from this hospitalization (including imaging, microbiology, ancillary and laboratory) are listed below for reference.     Microbiology: Recent Results (from the past 240 hour(s))  Respiratory Panel by PCR     Status: None   Collection Time: 04/24/17  8:41 PM  Result Value Ref Range Status   Adenovirus NOT DETECTED NOT DETECTED Final   Coronavirus 229E NOT DETECTED NOT DETECTED Final   Coronavirus HKU1 NOT DETECTED NOT DETECTED Final   Coronavirus NL63 NOT DETECTED NOT DETECTED Final   Coronavirus OC43 NOT DETECTED NOT DETECTED Final   Metapneumovirus NOT DETECTED NOT DETECTED Final   Rhinovirus / Enterovirus NOT DETECTED NOT DETECTED Final   Influenza A NOT DETECTED NOT DETECTED Final   Influenza B NOT DETECTED NOT DETECTED Final   Parainfluenza Virus 1 NOT DETECTED NOT DETECTED Final   Parainfluenza Virus 2 NOT DETECTED NOT DETECTED Final   Parainfluenza Virus 3 NOT DETECTED NOT DETECTED Final   Parainfluenza Virus 4 NOT DETECTED NOT DETECTED Final   Respiratory Syncytial Virus NOT DETECTED NOT DETECTED Final   Bordetella pertussis NOT DETECTED NOT DETECTED Final   Chlamydophila pneumoniae NOT DETECTED NOT DETECTED Final    Mycoplasma pneumoniae NOT DETECTED NOT DETECTED Final     Labs: BNP (last 3 results)  Recent Labs  08/25/16 0713 02/09/17 0340 04/24/17 0950  BNP 23.4 104.2* 536.1*   Basic Metabolic Panel:  Recent Labs Lab 04/22/17 1153 04/24/17 0950 04/25/17 0532 04/26/17 0740  NA 141 139 135 136  K 3.7 3.5 4.2 4.5  CL 90* 85* 83* 85*  CO2 48* 45* 43* 42*  GLUCOSE 165* 156* 340* 216*  BUN 14 <5* 9 11  CREATININE 0.54 0.56 0.53 0.53  CALCIUM 9.3 8.8* 8.7* 9.1  MG  --  1.8  --   --    Liver Function Tests:  Recent Labs Lab 04/24/17 0950  AST 72*  ALT 113*  ALKPHOS 115  BILITOT 1.3*  PROT 6.5  ALBUMIN 3.6   No results for input(s): LIPASE, AMYLASE in the last 168 hours. No results for input(s): AMMONIA in the last 168 hours. CBC:  Recent Labs Lab 04/24/17 0950 04/25/17 0532  WBC 9.6 9.1  NEUTROABS 6.7  --   HGB 14.1 13.3  HCT 46.6* 44.3  MCV 103.8* 104.0*  PLT 132* 130*   Cardiac Enzymes: No results for input(s): CKTOTAL, CKMB, CKMBINDEX, TROPONINI in the last 168 hours. BNP: Invalid input(s): POCBNP CBG:  Recent Labs Lab 04/26/17 1634 04/26/17 2217 04/27/17 0736 04/27/17 1157 04/27/17 1621  GLUCAP 449* 320* 198* 238* 283*   D-Dimer No results for input(s): DDIMER in the last 72 hours. Hgb A1c No results for input(s): HGBA1C in the last 72 hours. Lipid Profile No results for input(s): CHOL, HDL, LDLCALC, TRIG, CHOLHDL, LDLDIRECT in the last 72 hours. Thyroid function studies No results for input(s): TSH, T4TOTAL, T3FREE, THYROIDAB in the last 72 hours.  Invalid input(s): FREET3 Anemia work up No results for input(s): VITAMINB12, FOLATE, FERRITIN, TIBC, IRON, RETICCTPCT in the last 72 hours. Urinalysis    Component Value Date/Time   COLORURINE YELLOW 04/24/2017 1815   APPEARANCEUR HAZY (A) 04/24/2017 1815   LABSPEC 1.012 04/24/2017 1815   PHURINE 5.0 04/24/2017 1815   GLUCOSEU >=500 (A) 04/24/2017 1815   HGBUR NEGATIVE 04/24/2017 1815    BILIRUBINUR NEGATIVE 04/24/2017 1815   KETONESUR 5 (A) 04/24/2017 1815   PROTEINUR NEGATIVE 04/24/2017 1815   UROBILINOGEN 1.0 07/25/2015 1400   NITRITE NEGATIVE 04/24/2017 1815   LEUKOCYTESUR NEGATIVE 04/24/2017 1815   Sepsis Labs Invalid input(s): PROCALCITONIN,  WBC,  LACTICIDVEN Microbiology Recent Results (from the past 240 hour(s))  Respiratory Panel by PCR     Status: None   Collection Time: 04/24/17  8:41 PM  Result Value Ref Range Status   Adenovirus NOT DETECTED NOT DETECTED Final   Coronavirus 229E NOT DETECTED NOT DETECTED Final   Coronavirus HKU1 NOT DETECTED NOT DETECTED Final   Coronavirus NL63 NOT DETECTED NOT DETECTED Final   Coronavirus OC43 NOT DETECTED NOT DETECTED Final   Metapneumovirus NOT DETECTED NOT DETECTED Final   Rhinovirus / Enterovirus NOT DETECTED NOT DETECTED Final   Influenza A NOT DETECTED NOT DETECTED Final   Influenza B NOT DETECTED NOT DETECTED Final   Parainfluenza Virus 1 NOT DETECTED NOT DETECTED Final   Parainfluenza Virus 2 NOT DETECTED NOT DETECTED Final   Parainfluenza Virus 3 NOT DETECTED NOT DETECTED Final   Parainfluenza Virus 4 NOT DETECTED NOT DETECTED Final   Respiratory Syncytial Virus NOT DETECTED NOT DETECTED Final   Bordetella pertussis NOT DETECTED NOT DETECTED Final   Chlamydophila pneumoniae NOT DETECTED NOT DETECTED Final   Mycoplasma pneumoniae NOT DETECTED NOT DETECTED Final     Time coordinating discharge: 32 minutes  SIGNED:   Osie Bond  Carolin Sicks, MD  [facility] 04/27/2017, 4:30 PM  If 7PM-7AM, please contact night-coverage [email or web address] Password [email or web address]

## 2017-04-28 ENCOUNTER — Telehealth: Payer: Self-pay | Admitting: Family

## 2017-04-28 MED ORDER — ACETAMINOPHEN-CODEINE #3 300-30 MG PO TABS: 1.0000 | ORAL_TABLET | Freq: Four times a day (QID) | ORAL | 0 refills | Status: DC | PRN

## 2017-04-28 NOTE — Telephone Encounter (Signed)
Pt aware.

## 2017-04-28 NOTE — Telephone Encounter (Signed)
Pt called and is in a lot of pain for her neuropathy, she has taken the maz dose of tramadol. She would like something else called in for pain.   [facility] [place] [facility] [address]

## 2017-04-28 NOTE — Telephone Encounter (Signed)
Tylenol with codeine printed to be faxed.

## 2017-04-28 NOTE — Telephone Encounter (Signed)
[facility], [facility], returned call, cb [phone]

## 2017-04-28 NOTE — Telephone Encounter (Signed)
Spoke with Melissa/[facility]- aware that the patient is having a CPAP Titration 05/04/17 and that we will be in touch once we get those results. Most likely we will do an ONO after the Titration based on the results -- if started on CPAP or not. Aware that I will verify this with [facility] and we will let them know.  Will send to [name] to advise of her plan. Thanks.

## 2017-04-28 NOTE — Telephone Encounter (Signed)
That is fine , go for study  Make sure she keeps follow up with Dr. Vassie LollAlva  On 05/06/17 - very important.  Please contact office for sooner follow up if symptoms do not improve or worsen or seek emergency care

## 2017-04-28 NOTE — Telephone Encounter (Signed)
Spoke with pt in regards to below message. Pt states she was just d/c from hosp yesterday. Pt states she is not aware of [facility] needing a credit card on file for ONO.  Pt [name]. lmtcb x1 for Melissa with [facility].

## 2017-04-29 ENCOUNTER — Emergency Department (HOSPITAL_COMMUNITY): Payer: 59

## 2017-04-29 ENCOUNTER — Inpatient Hospital Stay (HOSPITAL_COMMUNITY)
Admission: EM | Admit: 2017-04-29 | Discharge: 2017-05-03 | DRG: 190 | Disposition: A | Discharge status: Home Health | Payer: 59 | Attending: Family Medicine | Admitting: Family Medicine

## 2017-04-29 ENCOUNTER — Encounter (HOSPITAL_COMMUNITY): Payer: Self-pay

## 2017-04-29 DIAGNOSIS — Z7952 Long term (current) use of systemic steroids: Secondary | ICD-10-CM

## 2017-04-29 DIAGNOSIS — F17210 Nicotine dependence, cigarettes, uncomplicated: Secondary | ICD-10-CM | POA: Diagnosis present

## 2017-04-29 DIAGNOSIS — R0902 Hypoxemia: Secondary | ICD-10-CM

## 2017-04-29 DIAGNOSIS — T380X5A Adverse effect of glucocorticoids and synthetic analogues, initial encounter: Secondary | ICD-10-CM | POA: Diagnosis present

## 2017-04-29 DIAGNOSIS — R0602 Shortness of breath: Secondary | ICD-10-CM | POA: Diagnosis not present

## 2017-04-29 DIAGNOSIS — D72829 Elevated white blood cell count, unspecified: Secondary | ICD-10-CM | POA: Diagnosis not present

## 2017-04-29 DIAGNOSIS — R7989 Other specified abnormal findings of blood chemistry: Secondary | ICD-10-CM | POA: Diagnosis not present

## 2017-04-29 DIAGNOSIS — I2729 Other secondary pulmonary hypertension: Secondary | ICD-10-CM | POA: Diagnosis present

## 2017-04-29 DIAGNOSIS — Z794 Long term (current) use of insulin: Secondary | ICD-10-CM | POA: Diagnosis not present

## 2017-04-29 DIAGNOSIS — E1142 Type 2 diabetes mellitus with diabetic polyneuropathy: Secondary | ICD-10-CM | POA: Diagnosis present

## 2017-04-29 DIAGNOSIS — Z6837 Body mass index (BMI) 37.0-37.9, adult: Secondary | ICD-10-CM

## 2017-04-29 DIAGNOSIS — J9600 Acute respiratory failure, unspecified whether with hypoxia or hypercapnia: Secondary | ICD-10-CM

## 2017-04-29 DIAGNOSIS — J441 Chronic obstructive pulmonary disease with (acute) exacerbation: Principal | ICD-10-CM

## 2017-04-29 DIAGNOSIS — E099 Drug or chemical induced diabetes mellitus without complications: Secondary | ICD-10-CM | POA: Diagnosis present

## 2017-04-29 DIAGNOSIS — G4733 Obstructive sleep apnea (adult) (pediatric): Secondary | ICD-10-CM | POA: Diagnosis not present

## 2017-04-29 DIAGNOSIS — R404 Transient alteration of awareness: Secondary | ICD-10-CM | POA: Diagnosis not present

## 2017-04-29 DIAGNOSIS — K7460 Unspecified cirrhosis of liver: Secondary | ICD-10-CM | POA: Diagnosis present

## 2017-04-29 DIAGNOSIS — Z86718 Personal history of other venous thrombosis and embolism: Secondary | ICD-10-CM | POA: Diagnosis not present

## 2017-04-29 DIAGNOSIS — E662 Morbid (severe) obesity with alveolar hypoventilation: Secondary | ICD-10-CM | POA: Diagnosis not present

## 2017-04-29 DIAGNOSIS — J9621 Acute and chronic respiratory failure with hypoxia: Secondary | ICD-10-CM | POA: Diagnosis present

## 2017-04-29 DIAGNOSIS — R5383 Other fatigue: Secondary | ICD-10-CM | POA: Diagnosis not present

## 2017-04-29 DIAGNOSIS — Z8249 Family history of ischemic heart disease and other diseases of the circulatory system: Secondary | ICD-10-CM

## 2017-04-29 DIAGNOSIS — I110 Hypertensive heart disease with heart failure: Secondary | ICD-10-CM | POA: Diagnosis present

## 2017-04-29 DIAGNOSIS — Z86711 Personal history of pulmonary embolism: Secondary | ICD-10-CM | POA: Diagnosis not present

## 2017-04-29 DIAGNOSIS — D72828 Other elevated white blood cell count: Secondary | ICD-10-CM | POA: Diagnosis present

## 2017-04-29 DIAGNOSIS — J9622 Acute and chronic respiratory failure with hypercapnia: Secondary | ICD-10-CM | POA: Diagnosis not present

## 2017-04-29 DIAGNOSIS — Z8701 Personal history of pneumonia (recurrent): Secondary | ICD-10-CM

## 2017-04-29 DIAGNOSIS — I5032 Chronic diastolic (congestive) heart failure: Secondary | ICD-10-CM | POA: Diagnosis not present

## 2017-04-29 DIAGNOSIS — J9611 Chronic respiratory failure with hypoxia: Secondary | ICD-10-CM | POA: Diagnosis not present

## 2017-04-29 DIAGNOSIS — Z9981 Dependence on supplemental oxygen: Secondary | ICD-10-CM | POA: Diagnosis not present

## 2017-04-29 DIAGNOSIS — M7989 Other specified soft tissue disorders: Secondary | ICD-10-CM | POA: Diagnosis not present

## 2017-04-29 LAB — CBC WITH DIFFERENTIAL/PLATELET
BASOS PCT: 0 %
Basophils Absolute: 0 10*3/uL (ref 0.0–0.1)
Eosinophils Absolute: 0.1 10*3/uL (ref 0.0–0.7)
Eosinophils Relative: 0 %
HCT: 49.9 % — ABNORMAL HIGH (ref 36.0–46.0)
Hemoglobin: 15 g/dL (ref 12.0–15.0)
Lymphocytes Relative: 10 %
Lymphs Abs: 1.5 10*3/uL (ref 0.7–4.0)
MCH: 31.7 pg (ref 26.0–34.0)
MCHC: 30.1 g/dL (ref 30.0–36.0)
MCV: 105.5 fL — ABNORMAL HIGH (ref 78.0–100.0)
Monocytes Absolute: 2 10*3/uL — ABNORMAL HIGH (ref 0.1–1.0)
Monocytes Relative: 14 %
NEUTROS PCT: 76 %
Neutro Abs: 11.2 10*3/uL — ABNORMAL HIGH (ref 1.7–7.7)
Platelets: 182 10*3/uL (ref 150–400)
RBC: 4.73 MIL/uL (ref 3.87–5.11)
RDW: 14.9 % (ref 11.5–15.5)
WBC: 14.7 10*3/uL — AB (ref 4.0–10.5)

## 2017-04-29 LAB — I-STAT TROPONIN, ED: Troponin i, poc: 0.05 ng/mL (ref 0.00–0.08)

## 2017-04-29 LAB — COMPREHENSIVE METABOLIC PANEL
ALBUMIN: 4.1 g/dL (ref 3.5–5.0)
ALK PHOS: 113 U/L (ref 38–126)
ALT: 141 U/L — AB (ref 14–54)
ANION GAP: 12 (ref 5–15)
AST: 84 U/L — ABNORMAL HIGH (ref 15–41)
BUN: 22 mg/dL — ABNORMAL HIGH (ref 6–20)
CO2: 40 mmol/L — AB (ref 22–32)
CREATININE: 0.83 mg/dL (ref 0.44–1.00)
Calcium: 9.2 mg/dL (ref 8.9–10.3)
Chloride: 84 mmol/L — ABNORMAL LOW (ref 101–111)
GFR calc Af Amer: >60 mL/min (ref >60)
GFR calc non Af Amer: >60 mL/min (ref >60)
Glucose, Bld: 179 mg/dL — ABNORMAL HIGH (ref 65–99)
Potassium: 5 mmol/L (ref 3.5–5.1)
SODIUM: 136 mmol/L (ref 135–145)
Total Bilirubin: 1.4 mg/dL — ABNORMAL HIGH (ref 0.3–1.2)
Total Protein: 6.9 g/dL (ref 6.5–8.1)

## 2017-04-29 LAB — BRAIN NATRIURETIC PEPTIDE: B NATRIURETIC PEPTIDE 5: 537 pg/mL — AB (ref 0.0–100.0)

## 2017-04-29 MED ORDER — FENTANYL CITRATE (PF) 100 MCG/2ML IJ SOLN
25.0000 ug | Freq: Once | INTRAMUSCULAR | Status: AC
  Administered 2017-04-29: 25 ug via INTRAVENOUS
  Filled 2017-04-29: qty 2

## 2017-04-29 MED ORDER — DEXTROSE 5 % IV SOLN: 500.0000 mg | INTRAVENOUS | Status: DC

## 2017-04-29 MED ORDER — PREDNISONE 5 MG PO TABS
5.0000 mg | ORAL_TABLET | Freq: Every day | ORAL | Status: DC
  Administered 2017-04-30 – 2017-05-03 (×4): 5 mg via ORAL
  Filled 2017-04-29 (×5): qty 1

## 2017-04-29 MED ORDER — IPRATROPIUM-ALBUTEROL 0.5-2.5 (3) MG/3ML IN SOLN
3.0000 mL | Freq: Four times a day (QID) | RESPIRATORY_TRACT | Status: DC
  Administered 2017-04-29: 3 mL via RESPIRATORY_TRACT

## 2017-04-29 MED ORDER — TIOTROPIUM BROMIDE MONOHYDRATE 18 MCG IN CAPS
18.0000 ug | ORAL_CAPSULE | Freq: Every day | RESPIRATORY_TRACT | Status: DC
  Administered 2017-04-30 – 2017-05-03 (×3): 18 ug via RESPIRATORY_TRACT
  Filled 2017-04-29 (×2): qty 5

## 2017-04-29 MED ORDER — PANTOPRAZOLE SODIUM 40 MG PO TBEC
40.0000 mg | DELAYED_RELEASE_TABLET | Freq: Every day | ORAL | Status: DC
  Administered 2017-04-30 – 2017-05-03 (×4): 40 mg via ORAL
  Filled 2017-04-29 (×4): qty 1

## 2017-04-29 MED ORDER — ONDANSETRON HCL 4 MG/2ML IJ SOLN: 4.0000 mg | Freq: Four times a day (QID) | INTRAMUSCULAR | Status: DC | PRN

## 2017-04-29 MED ORDER — TORSEMIDE 20 MG PO TABS
20.0000 mg | ORAL_TABLET | Freq: Every day | ORAL | Status: DC
  Administered 2017-04-30 – 2017-05-03 (×3): 20 mg via ORAL
  Filled 2017-04-29 (×4): qty 1

## 2017-04-29 MED ORDER — ROFLUMILAST 500 MCG PO TABS
500.0000 ug | ORAL_TABLET | Freq: Every day | ORAL | Status: DC
  Administered 2017-04-30 – 2017-05-03 (×4): 500 ug via ORAL
  Filled 2017-04-29 (×4): qty 1

## 2017-04-29 MED ORDER — GUAIFENESIN ER 600 MG PO TB12
600.0000 mg | ORAL_TABLET | Freq: Two times a day (BID) | ORAL | Status: DC
Start: 2017-04-30 — End: 2017-05-03
  Administered 2017-04-30 – 2017-05-03 (×7): 600 mg via ORAL
  Filled 2017-04-29 (×7): qty 1

## 2017-04-29 MED ORDER — PREGABALIN 50 MG PO CAPS
100.0000 mg | ORAL_CAPSULE | Freq: Three times a day (TID) | ORAL | Status: DC
  Administered 2017-04-30: 100 mg via ORAL
  Filled 2017-04-29 (×2): qty 2

## 2017-04-29 MED ORDER — ENOXAPARIN SODIUM 40 MG/0.4ML ~~LOC~~ SOLN
40.0000 mg | Freq: Every day | SUBCUTANEOUS | Status: DC
  Administered 2017-04-30 – 2017-05-02 (×3): 40 mg via SUBCUTANEOUS
  Filled 2017-04-29 (×3): qty 0.4

## 2017-04-29 MED ORDER — SODIUM CHLORIDE 0.9 % IV SOLN: 250.0000 mL | INTRAVENOUS | Status: DC | PRN

## 2017-04-29 MED ORDER — METHYLPREDNISOLONE SODIUM SUCC 125 MG IJ SOLR: 60.0000 mg | Freq: Four times a day (QID) | INTRAMUSCULAR | Status: DC

## 2017-04-29 MED ORDER — MOMETASONE FURO-FORMOTEROL FUM 200-5 MCG/ACT IN AERO
2.0000 | INHALATION_SPRAY | Freq: Two times a day (BID) | RESPIRATORY_TRACT | Status: DC
  Administered 2017-04-30 – 2017-05-03 (×7): 2 via RESPIRATORY_TRACT
  Filled 2017-04-29 (×2): qty 8.8

## 2017-04-29 MED ORDER — MAGNESIUM SULFATE 2 GM/50ML IV SOLN
2.0000 g | Freq: Once | INTRAVENOUS | Status: AC
  Administered 2017-04-29: 2 g via INTRAVENOUS
  Filled 2017-04-29: qty 50

## 2017-04-29 MED ORDER — BISACODYL 5 MG PO TBEC
5.0000 mg | DELAYED_RELEASE_TABLET | Freq: Every day | ORAL | Status: DC | PRN
  Filled 2017-04-29: qty 1

## 2017-04-29 MED ORDER — IPRATROPIUM BROMIDE 0.02 % IN SOLN
0.5000 mg | Freq: Once | RESPIRATORY_TRACT | Status: AC
  Administered 2017-04-29: 0.5 mg via RESPIRATORY_TRACT
  Filled 2017-04-29: qty 2.5

## 2017-04-29 MED ORDER — ALBUTEROL (5 MG/ML) CONTINUOUS INHALATION SOLN
15.0000 mg/h | INHALATION_SOLUTION | Freq: Once | RESPIRATORY_TRACT | Status: AC
  Administered 2017-04-29: 15 mg/h via RESPIRATORY_TRACT
  Filled 2017-04-29: qty 20

## 2017-04-29 MED ORDER — ONDANSETRON HCL 4 MG PO TABS: 4.0000 mg | ORAL_TABLET | Freq: Four times a day (QID) | ORAL | Status: DC | PRN

## 2017-04-29 MED ORDER — SODIUM CHLORIDE 0.9% FLUSH: 3.0000 mL | INTRAVENOUS | Status: DC | PRN

## 2017-04-29 MED ORDER — POLYETHYLENE GLYCOL 3350 17 G PO PACK: 17.0000 g | PACK | Freq: Every day | ORAL | Status: DC | PRN

## 2017-04-29 MED ORDER — ACETAMINOPHEN-CODEINE #3 300-30 MG PO TABS
1.0000 | ORAL_TABLET | Freq: Four times a day (QID) | ORAL | Status: DC | PRN
  Administered 2017-04-29 – 2017-04-30 (×2): 2 via ORAL
  Filled 2017-04-29 (×2): qty 2

## 2017-04-29 MED ORDER — TRAMADOL HCL 50 MG PO TABS: 50.0000 mg | ORAL_TABLET | Freq: Four times a day (QID) | ORAL | Status: DC | PRN

## 2017-04-29 MED ORDER — SODIUM CHLORIDE 0.9% FLUSH
3.0000 mL | Freq: Two times a day (BID) | INTRAVENOUS | Status: DC
  Administered 2017-04-30 – 2017-05-02 (×3): 3 mL via INTRAVENOUS

## 2017-04-29 MED ORDER — GABAPENTIN 300 MG PO CAPS
300.0000 mg | ORAL_CAPSULE | Freq: Once | ORAL | Status: AC
  Administered 2017-04-29: 300 mg via ORAL
  Filled 2017-04-29: qty 1

## 2017-04-29 MED ORDER — METHYLPREDNISOLONE SODIUM SUCC 125 MG IJ SOLR
125.0000 mg | Freq: Once | INTRAMUSCULAR | Status: AC
  Administered 2017-04-29: 125 mg via INTRAVENOUS
  Filled 2017-04-29: qty 2

## 2017-04-29 MED ORDER — ACETAMINOPHEN-CODEINE #3 300-30 MG PO TABS: 1.0000 | ORAL_TABLET | Freq: Four times a day (QID) | ORAL | Status: DC | PRN

## 2017-04-29 MED ORDER — IPRATROPIUM-ALBUTEROL 0.5-2.5 (3) MG/3ML IN SOLN: 3.0000 mL | RESPIRATORY_TRACT | Status: DC | PRN

## 2017-04-29 NOTE — ED Notes (Signed)
Report attempted 

## 2017-04-29 NOTE — Telephone Encounter (Signed)
Attempted to call the pt and the VM has not been set up.  Will try back later.

## 2017-04-29 NOTE — Consult Note (Signed)
Name: Mary [name] MRN: [id number] DOB: 1976-07-23    ADMISSION DATE:  04/29/2017 CONSULTATION DATE:  04/29/2017  REFERRING MD :  Dr. Darl Householder  CHIEF COMPLAINT:  Somnolence  HISTORY OF PRESENT ILLNESS:   41 CF with neuropathy (likely diabetic and long term previous EtOH use), DM, Pulmonary hypertension (WHO III) and very severe COPD (on 3L home O2) (followed by Dr. Chase Caller).  Recently discharged from [facility] after admission for AECOPD.  She was home for two days and started on tylenol #3 by her PCP in addition to her neuropathy regimen (Lyrica, Neurontin, tramadol).  She states that she was extremely somnolent while at home.  She denied any SOB, CP, DOE, n/v/d, fevers, cough, LE edema.    She states she took 3 tylenol #3 each day for the last two days in addition to the above regimen.  She denies other sedating medications as well as EtOH.  She states today she slept most of the day and did not get up until this afternoon when her boyfriend came home (even though she slept all night and all day yesterday).  She states she has also been having muscle jerks that are new for her.  She did say that she has been using her home Bipap at night and with all daytime naps.  Her new settings after her recent hospitalization are BiPAP 16/5.   PAST MEDICAL HISTORY :   has a past medical history of Axillary adenopathy; Chronic right-sided CHF (congestive heart failure) ([place]) (03/23/2012); COPD (chronic obstructive pulmonary disease) ([place]); DVT of upper extremity (deep vein thrombosis) [facility]) (April 2013); Exertional shortness of breath; Hepatic cirrhosis ([place]); History of alcoholism ([place]); History of chronic bronchitis; Irregular menses; Migraine; Moderate to severe pulmonary hypertension [facility]) (April 2013); On home oxygen therapy; OSA (obstructive sleep apnea); Periodontal disease; Pneumonia (~ 1985; 01/2011); Pulmonary embolus [facility]) (April 2013); and Pulmonary hypertension ([place]).  has a past surgical history that  includes Cardiac surgery (1977[birth year]07[birth year]03); Lung surgery (01/03/77); Appendectomy (05/03/2013); Cardiac catheterization (03/2012); laparoscopic appendectomy (N/A, 05/03/2013); right heart catheterization (N/A, 03/07/2012); and Cardiac catheterization (N/A, 01/08/2016). Prior to Admission medications   Medication Sig Start Date End Date Taking? Authorizing Provider  acetaminophen-codeine (TYLENOL #3) 300-30 MG tablet Take 1-2 tablets by mouth every 6 (six) hours as needed for moderate pain. 04/28/17   Golden Circle, FNP  albuterol (PROVENTIL HFA;VENTOLIN HFA) 108 (90 BASE) MCG/ACT inhaler Inhale 2 puffs into the lungs every 6 (six) hours as needed for wheezing or shortness of breath. 09/06/14   Brand Males, MD  Blood Glucose Monitoring Suppl (ONE TOUCH ULTRA 2) w/Device KIT USE TO CHECK BLOOD SUGARS TWICE A DAY 04/21/17   Golden Circle, FNP  budesonide-formoterol (SYMBICORT) 160-4.5 MCG/ACT inhaler Inhale 2 puffs into the lungs 2 (two) times daily. 03/28/17   Erick Colace, NP  gabapentin (NEURONTIN) 300 MG capsule TAKE 2 CAPSULES BY MOUTH 3 TIMES DAILY. 04/21/17   Golden Circle, FNP  guaiFENesin (MUCINEX) 600 MG 12 hr tablet Take 1 tablet (600 mg total) by mouth 2 (two) times daily. 02/11/17   Barton Dubois, MD  HYDROcodone-acetaminophen (NORCO/VICODIN) 5-325 MG tablet Take 1-2 tablets by mouth every 6 (six) hours as needed. 04/20/17   Horton, Barbette Hair, MD  insulin glargine (LANTUS) 100 UNIT/ML injection Inject 0.25 mLs (25 Units total) into the skin daily. 03/29/17   Hongalgi, Lenis Dickinson, MD  insulin lispro (HUMALOG) 100 UNIT/ML injection Inject 0-0.15 mLs (0-15 Units total) into the skin 3 (three) times daily with  meals. CBG < 70: Eat or drink something right away and recheck. CBG 70 - 120: 0 units CBG 121 - 150: 2 units CBG 151 - 200: 3 units CBG 201 - 250: 5 units CBG 251 - 300: 8 units CBG 301 - 350: 11 units CBG 351 - 400: 15 units CBG > 400: call MD. 03/28/17   Modena Jansky, MD    pantoprazole (PROTONIX) 40 MG tablet Take 1 tablet (40 mg total) by mouth 2 (two) times daily before a meal. Patient taking differently: Take 40 mg by mouth daily.  07/22/16   Kelvin Cellar, MD  potassium chloride SA (K-DUR,KLOR-CON) 20 MEQ tablet Take 1 tablet (20 mEq total) by mouth daily. 01/29/16   Bensimhon, Shaune Pascal, MD  predniSONE (DELTASONE) 5 MG tablet Resume 5 mg daily after you have completed the 78m tab taper 03/28/17   BErick Colace NP  pregabalin (LYRICA) 50 MG capsule Take 2 capsules (100 mg total) by mouth 3 (three) times daily. 04/21/17   CGolden Circle FNP  roflumilast (DALIRESP) 500 MCG TABS tablet Take 500 mcg by mouth daily.    [provider]  Tiotropium Bromide Monohydrate (SPIRIVA RESPIMAT) 1.25 MCG/ACT AERS Inhale 2 puffs into the lungs daily. 04/06/17   RBrand Males MD  torsemide (DEMADEX) 20 MG tablet Take 1 tablet (20 mg total) by mouth daily. 12/03/16   RBrand Males MD  traMADol (ULTRAM) 50 MG tablet TAKE 1 TABLET EVERY 6 HOURS AS NEEDED FOR SEVERE PAIN 04/21/17   CGolden Circle FNP   No Known Allergies  FAMILY HISTORY:  family history includes Alcohol abuse in her father; Heart disease in her father; Hypertension in her father; Other in her brother. SOCIAL HISTORY:  reports that she has been smoking Cigarettes.  She has a 25.00 pack-year smoking history. She has never used smokeless tobacco. She reports that she drinks about 0.6 oz of alcohol per week . She reports that she does not use drugs.  REVIEW OF SYSTEMS:   Constitutional: Negative for fever, chills, weight loss, malaise/fatigue and diaphoresis.  HENT: Negative for hearing loss, ear pain, nosebleeds, congestion, sore throat, neck pain, tinnitus and ear discharge.   Eyes: Negative for blurred vision, double vision, photophobia, pain, discharge and redness.  Respiratory: Negative for cough, hemoptysis, sputum production, shortness of breath, wheezing and stridor.    Cardiovascular: Negative for chest pain, palpitations, orthopnea, claudication, leg swelling and PND.  Gastrointestinal: Negative for heartburn, nausea, vomiting, abdominal pain, diarrhea, constipation, blood in stool and melena.  Genitourinary: Negative for dysuria, urgency, frequency, hematuria and flank pain.  Musculoskeletal: Negative for myalgias, back pain, joint pain and falls.  Skin: Negative for itching and rash.  Neurological: Negative for dizziness, tingling, tremors, sensory change, speech change, focal weakness, seizures, loss of consciousness, weakness and headaches.  Endo/Heme/Allergies: Negative for environmental allergies and polydipsia. Does not bruise/bleed easily.  SUBJECTIVE:   VITAL SIGNS: Temp:  [97.3 F (36.3 C)] 97.3 F (36.3 C) (05/25 1839) Pulse Rate:  [92-94] 94 (05/25 2015) Resp:  [14-27] 27 (05/25 2015) BP: (102-135)/(75-94) 102/75 (05/25 2015) SpO2:  [75 %-100 %] 95 % (05/25 2015) Weight:  [94.3 kg (208 lb)] 94.3 kg (208 lb) (05/25 2017)  PHYSICAL EXAMINATION: General:  AAOx3, NAD Neuro:  CN II-XII intact, + asterixis, + tremulousness HEENT:  NCAT, PERLA Cardiovascular:   Distant Heart sounds Lungs:  CTA, no w/r/r Abdomen:  Large, non distended, normal bowel sounds, no TTP or guarding Musculoskeletal:  TTP LLE, normal bulk  and tone Skin:  LLE 1-2 pitting edema with ecchymosis left toes, feet, ankle upto knee with posterior calf tenderness.    Recent Labs Lab 04/25/17 0532 04/26/17 0740 04/29/17 1900  NA 135 136 136  K 4.2 4.5 5.0  CL 83* 85* 84*  CO2 43* 42* 40*  BUN 9 11 22*  CREATININE 0.53 0.53 0.83  GLUCOSE 340* 216* 179*    Recent Labs Lab 04/24/17 0950 04/25/17 0532 04/29/17 1900  HGB 14.1 13.3 15.0  HCT 46.6* 44.3 49.9*  WBC 9.6 9.1 14.7*  PLT 132* 130* 182   Dg Chest Port 1 View  Result Date: 04/29/2017 CLINICAL DATA:  41 y/o  F; shortness of breath and weakness. EXAM: PORTABLE CHEST 1 VIEW COMPARISON:  04/24/2017 chest  radiograph. FINDINGS: Stable cardiac silhouette given projection and technique. Blunted left costal diaphragmatic angle, probably prominent epicardial fat pad. Consider lateral radiographs to exclude small effusion. Otherwise clear lungs. Bones are unremarkable. IMPRESSION: Blunted left costal diaphragmatic angle, probably prominent epicardial fat pad. Consider lateral radiographs to exclude small effusion. Otherwise clear lungs. Electronically Signed   By: Kristine Garbe M.D.   On: 04/29/2017 19:29    ASSESSMENT / PLAN:  41 yo CF with OSA, OHS, very severe COPD p/w somnolence which is likely due to the addition of another sedating agent (new narcotic) in the setting of her already extensive regimen with CNS depressants and concurrent COPD with chronic CO2 retention.  It is likely that even the slightest respiratory depressive effect produced a worsening hypercapnia which can worsen sedation and produce a self perpetuating cycle.  This hypercapnia would also explain her neurologic findings representing hypercapnic narcosis.  She does not appear to have a persistent or worsening exacerbation of her COPD at this time.  Her pulmonary hypertension is well compensated and she is euvolemic at this time, not a candidate for pulmonary vasodilators at present time, can revisit as outpatient, though data for the use of this in patients with WHO III are not good. She was seen by [facility] services and is not a candidate for transplantation. She is still smoking. She has had Alpha-1 Antitrypsin testing and is Genotype PiMM with normal serum levels.  Recommend: - No indication for ICU admission - Admit to hospitalist stepdown - continue home spiriva, roflumilast, symbicort and prednisone 26m Qday - Recommend d/c or reduce dose of narcotics - Recommend consideration of a less sedating neuropathy regimen - Continue BiPAP 16/8 tonight and with all naps - D-dimer - LLE duplex ordered - if dimer  positive and LLE negative - please obtain CT- PE study - check ABG  Total consultation time: 30 min  Time was exclusive of separately billable procedures and treating other patients.  Consultation time was spent face to face and was time spent personally by me on the following activities: development of treatment plan with patient and/or surrogate as well as nursing, discussions with consultants, evaluation of patient's response to treatment, examination of patient, obtaining history from patient or surrogate, ordering and performing treatments and interventions, ordering and review of laboratory studies, ordering and review of radiographic studies, pulse oximetry and re-evaluation of patient's condition.   DMeribeth Mattes DO., MS [facility]  Pulmonary and CLaMourePager: [phone] 04/29/2017, 9:07 PM

## 2017-04-29 NOTE — ED Triage Notes (Signed)
Per EMS: Pt complaining of SOB and weakness. EMS states pt recently hospitalized for COPD. Today, increased weakness and SOB. Normally wears 3L O2 at home. Per EMS ptt sats 85% on 3L. Upon arrival to ED, pt sats 75% on 4LO2. Placed on non-rebreather. MD at bedside. EMS gave 5mg  Albuterol.

## 2017-04-29 NOTE — H&P (Signed)
History and Physical    Mary [name] SWF:[id number] DOB: [birth year] DOA: 04/29/2017  PCP: Golden Circle, FNP   Patient coming from: Home  Chief Complaint: SOB   HPI: Mary [name] is a 41 y.o. female with medical history significant for steroid dependent COPD with chronic 3 L/m supplemental oxygen requirement, chronic diastolic CHF, history of DVT, steroid-induced diabetes with neuropathy, obesity hypoventilation syndrome, and obstructive sleep apnea using BiPAP at home, now presenting to the emergency department with acute on chronic dyspnea. Patient had just been admitted to the hospital from 04/24/2017 until 04/27/2017 for exacerbation and COPD. She reports being in much improved and stable condition at time of discharge, but over the past couple days, has redeveloped severe dyspnea while at rest, as well as hypoxia in the low 80s on her usual 3 L/m of supplemental oxygen. She denies any significant chest pain and denies any new cough. There has not been any fevers or chills. She has been using her home therapies with very little relief. She called EMS for transport to the hospital and was noted to be saturating 75% on 4 L/m. She was treated with 5 mg albuterol neb prior to arrival.  ED Course: Upon arrival to the ED, patient is found to be afebrile, saturating 75% on 3 L/m, and with vitals otherwise stable. EKG features sinus rhythm with nonspecific T-wave abnormalities in the anterolateral leads. Chest x-ray is notable for a blunted left costodiaphragmatic angle, likely epicardial fat pad. Chemistry panel is notable for a bicarbonate of 40, lower than priors, and a serum glucose of 179. CBC is notable for a leukocytosis to 14,700 and percent ptosis without anemia. Troponin is within the normal limits. Patient was placed on BiPAP in the ED, was treated with duo nebs, 125 mg IV Solu-Medrol, and 2 g of magnesium. [facility] and was consulted by the ED physician and evaluated the patient in the  emergency department, finding her to be stable for SDU, and advising for a medical admission. Patient improved in the ED on BiPAP and has remained hemodynamically stable. She'll be admitted to the stepdown unit for ongoing evaluation and management of acute on chronic respiratory failure with hypoxia and hypercarbia, suspected secondary to acute exacerbation and COPD.  Review of Systems:  All other systems reviewed and apart from HPI, are negative.  Past Medical History:  Diagnosis Date  . Axillary adenopathy    right axillary adenopathy noted on CT chest (03/06/2012)  . Chronic right-sided CHF (congestive heart failure) ([place]) 03/23/2012   with cor pulmonale. Last RHC 03/2012  . COPD (chronic obstructive pulmonary disease) ([place])    PFTS 03/08/12: fev1 0.58L/1%, FVC 1.18/33%, Ratop 49 and c/w ssevere obstruction. 21% BD response on FVC, RV 219%, DLCO 11/54%  . DVT of upper extremity (deep vein thrombosis) [facility]) April 2013   right subclavian // Unclear precipitating cause - possibly significant right heart failure, with vascular stasis potentially predisposing to clotting.    . Exertional shortness of breath   . Hepatic cirrhosis ([place])    Questionable history of - Noted on CT abdomen (03/2012) - thought to be due to vascular congestion from right heart failure +/- patient's history of alcohol abuse  . History of alcoholism ([place])   . History of chronic bronchitis   . Irregular menses   . Migraine    "~ 1/yr" (05/03/2013)  . Moderate to severe pulmonary hypertension [facility]) April 2013   Cardiac cath on 03/06/12 - 1. Elevated pulmonary artery pressures, right  sided filling pressures.,  2. PA: 64/45 (mean 53)    . On home oxygen therapy    "2-3 L 24/7" (05/03/2013)  . OSA (obstructive sleep apnea)    wears noctural BiPAP (05/03/2013)  . Periodontal disease   . Pneumonia ~ 1985; 01/2011  . Pulmonary embolus [facility]) April 2013   Precipitating cause unclear. Was treated with coumadin from April-June 2013.    . Pulmonary hypertension ([place])     Past Surgical History:  Procedure Laterality Date  . APPENDECTOMY  05/03/2013  . CARDIAC CATHETERIZATION  03/2012  . CARDIAC CATHETERIZATION N/A 01/08/2016   Procedure: Right Heart Cath;  Surgeon: Jolaine Artist, MD;  Location: [place] [facility];  Service: Cardiovascular;  Laterality: N/A;  . CARDIAC SURGERY  Nov 29, 1977   "my heart was backwards" (05/03/2013)  . LAPAROSCOPIC APPENDECTOMY N/A 05/03/2013   Procedure: APPENDECTOMY LAPAROSCOPIC;  Surgeon: Gwenyth Ober, MD;  Location: [place];  Service: General;  Laterality: N/A;  . LUNG SURGERY  [birth year]  . RIGHT HEART CATHETERIZATION N/A 03/07/2012   Procedure: RIGHT HEART CATH;  Surgeon: Burnell Blanks, MD;  Location: [facility] CATH LAB;  Service: Cardiovascular;  Laterality: N/A;     reports that she has been smoking Cigarettes.  She has a 25.00 pack-year smoking history. She has never used smokeless tobacco. She reports that she drinks about 0.6 oz of alcohol per week . She reports that she does not use drugs.  No Known Allergies  Family History  Problem Relation Age of Onset  . Hypertension Father   . Heart disease Father        CHF; died age 73   . Alcohol abuse Father   . Other Brother        died age 70 y.o overdose     Prior to Admission medications   Medication Sig Start Date End Date Taking? Authorizing Provider  acetaminophen-codeine (TYLENOL #3) 300-30 MG tablet Take 1-2 tablets by mouth every 6 (six) hours as needed for moderate pain. 04/28/17   Golden Circle, FNP  albuterol (PROVENTIL HFA;VENTOLIN HFA) 108 (90 BASE) MCG/ACT inhaler Inhale 2 puffs into the lungs every 6 (six) hours as needed for wheezing or shortness of breath. 09/06/14   Brand Males, MD  Blood Glucose Monitoring Suppl (ONE TOUCH ULTRA 2) w/Device KIT USE TO CHECK BLOOD SUGARS TWICE A DAY 04/21/17   Golden Circle, FNP  budesonide-formoterol (SYMBICORT) 160-4.5 MCG/ACT inhaler Inhale 2 puffs into the lungs  2 (two) times daily. 03/28/17   Erick Colace, NP  gabapentin (NEURONTIN) 300 MG capsule TAKE 2 CAPSULES BY MOUTH 3 TIMES DAILY. 04/21/17   Golden Circle, FNP  guaiFENesin (MUCINEX) 600 MG 12 hr tablet Take 1 tablet (600 mg total) by mouth 2 (two) times daily. 02/11/17   Barton Dubois, MD  HYDROcodone-acetaminophen (NORCO/VICODIN) 5-325 MG tablet Take 1-2 tablets by mouth every 6 (six) hours as needed. 04/20/17   Horton, Barbette Hair, MD  insulin glargine (LANTUS) 100 UNIT/ML injection Inject 0.25 mLs (25 Units total) into the skin daily. 03/29/17   Hongalgi, Lenis Dickinson, MD  insulin lispro (HUMALOG) 100 UNIT/ML injection Inject 0-0.15 mLs (0-15 Units total) into the skin 3 (three) times daily with meals. CBG < 70: Eat or drink something right away and recheck. CBG 70 - 120: 0 units CBG 121 - 150: 2 units CBG 151 - 200: 3 units CBG 201 - 250: 5 units CBG 251 - 300: 8 units CBG 301 - 350: 11 units  CBG 351 - 400: 15 units CBG > 400: call MD. 03/28/17   Modena Jansky, MD  pantoprazole (PROTONIX) 40 MG tablet Take 1 tablet (40 mg total) by mouth 2 (two) times daily before a meal. Patient taking differently: Take 40 mg by mouth daily.  07/22/16   Kelvin Cellar, MD  potassium chloride SA (K-DUR,KLOR-CON) 20 MEQ tablet Take 1 tablet (20 mEq total) by mouth daily. 01/29/16   Bensimhon, Shaune Pascal, MD  predniSONE (DELTASONE) 5 MG tablet Resume 5 mg daily after you have completed the '10mg'$  tab taper 03/28/17   Erick Colace, NP  pregabalin (LYRICA) 50 MG capsule Take 2 capsules (100 mg total) by mouth 3 (three) times daily. 04/21/17   Golden Circle, FNP  roflumilast (DALIRESP) 500 MCG TABS tablet Take 500 mcg by mouth daily.    [provider]  Tiotropium Bromide Monohydrate (SPIRIVA RESPIMAT) 1.25 MCG/ACT AERS Inhale 2 puffs into the lungs daily. 04/06/17   Brand Males, MD  torsemide (DEMADEX) 20 MG tablet Take 1 tablet (20 mg total) by mouth daily. 12/03/16   Brand Males, MD   traMADol (ULTRAM) 50 MG tablet TAKE 1 TABLET EVERY 6 HOURS AS NEEDED FOR SEVERE PAIN 04/21/17   Golden Circle, FNP    Physical Exam: Vitals:   04/29/17 2000 04/29/17 2015 04/29/17 2017 04/29/17 2100  BP: 125/84 102/75  114/77  Pulse: 94 94  91  Resp: 20 (!) 27  (!) 23  Temp:      TempSrc:      SpO2: 96% 95%  98%  Weight:   94.3 kg (208 lb)   Height:   '5\' 3"'$  (1.6 m)       Constitutional: Not in acute distress. Increased WOB. Obese. On BiPAP.  Eyes: PERTLA, lids and conjunctivae normal ENMT: Mucous membranes are moist. Posterior pharynx clear of any exudate or lesions.   Neck: normal, supple, no masses, no thyromegaly Respiratory: Breath sounds diminished bilaterally. No crackles or rhonchi appreciated. No pallor or cyanosis. Increased WOB.    Cardiovascular: S1 & S2 heard, regular rate and rhythm. JVP not well-visualized. Abdomen: No distension, no tenderness, no masses palpated. Bowel sounds normal.  Musculoskeletal: no clubbing / cyanosis. No joint deformity upper and lower extremities.   Skin: no significant rashes, lesions, ulcers. Warm, dry, well-perfused. Neurologic: CN 2-12 grossly intact. Sensation intact, DTR normal. Strength 5/5 in all 4 limbs.  Psychiatric: Alert and oriented x 3. Calm and cooperative.     Labs on Admission: I have personally reviewed following labs and imaging studies  CBC:  Recent Labs Lab 04/24/17 0950 04/25/17 0532 04/29/17 1900  WBC 9.6 9.1 14.7*  NEUTROABS 6.7  --  11.2*  HGB 14.1 13.3 15.0  HCT 46.6* 44.3 49.9*  MCV 103.8* 104.0* 105.5*  PLT 132* 130* 175   Basic Metabolic Panel:  Recent Labs Lab 04/24/17 0950 04/25/17 0532 04/26/17 0740 04/29/17 1900  NA 139 135 136 136  K 3.5 4.2 4.5 5.0  CL 85* 83* 85* 84*  CO2 45* 43* 42* 40*  GLUCOSE 156* 340* 216* 179*  BUN <5* 9 11 22*  CREATININE 0.56 0.53 0.53 0.83  CALCIUM 8.8* 8.7* 9.1 9.2  MG 1.8  --   --   --    GFR: Estimated Creatinine Clearance: 99.4 mL/min (by  C-G formula based on SCr of 0.83 mg/dL). Liver Function Tests:  Recent Labs Lab 04/24/17 0950 04/29/17 1900  AST 72* 84*  ALT 113* 141*  ALKPHOS 115  113  BILITOT 1.3* 1.4*  PROT 6.5 6.9  ALBUMIN 3.6 4.1   No results for input(s): LIPASE, AMYLASE in the last 168 hours. No results for input(s): AMMONIA in the last 168 hours. Coagulation Profile: No results for input(s): INR, PROTIME in the last 168 hours. Cardiac Enzymes: No results for input(s): CKTOTAL, CKMB, CKMBINDEX, TROPONINI in the last 168 hours. BNP (last 3 results)  Recent Labs  04/22/17 1153  PROBNP 129.0*   HbA1C: No results for input(s): HGBA1C in the last 72 hours. CBG:  Recent Labs Lab 04/26/17 1634 04/26/17 2217 04/27/17 0736 04/27/17 1157 04/27/17 1621  GLUCAP 449* 320* 198* 238* 283*   Lipid Profile: No results for input(s): CHOL, HDL, LDLCALC, TRIG, CHOLHDL, LDLDIRECT in the last 72 hours. Thyroid Function Tests: No results for input(s): TSH, T4TOTAL, FREET4, T3FREE, THYROIDAB in the last 72 hours. Anemia Panel: No results for input(s): VITAMINB12, FOLATE, FERRITIN, TIBC, IRON, RETICCTPCT in the last 72 hours. Urine analysis:    Component Value Date/Time   COLORURINE YELLOW 04/24/2017 1815   APPEARANCEUR HAZY (A) 04/24/2017 1815   LABSPEC 1.012 04/24/2017 1815   PHURINE 5.0 04/24/2017 1815   GLUCOSEU >=500 (A) 04/24/2017 1815   HGBUR NEGATIVE 04/24/2017 1815   BILIRUBINUR NEGATIVE 04/24/2017 1815   KETONESUR 5 (A) 04/24/2017 1815   PROTEINUR NEGATIVE 04/24/2017 1815   UROBILINOGEN 1.0 07/25/2015 1400   NITRITE NEGATIVE 04/24/2017 1815   LEUKOCYTESUR NEGATIVE 04/24/2017 1815   Sepsis Labs: _0 (procalcitonin:4,lacticidven:4) ) Recent Results (from the past 240 hour(s))  Respiratory Panel by PCR     Status: None   Collection Time: 04/24/17  8:41 PM  Result Value Ref Range Status   Adenovirus NOT DETECTED NOT DETECTED Final   Coronavirus 229E NOT DETECTED NOT DETECTED Final    Coronavirus HKU1 NOT DETECTED NOT DETECTED Final   Coronavirus NL63 NOT DETECTED NOT DETECTED Final   Coronavirus OC43 NOT DETECTED NOT DETECTED Final   Metapneumovirus NOT DETECTED NOT DETECTED Final   Rhinovirus / Enterovirus NOT DETECTED NOT DETECTED Final   Influenza A NOT DETECTED NOT DETECTED Final   Influenza B NOT DETECTED NOT DETECTED Final   Parainfluenza Virus 1 NOT DETECTED NOT DETECTED Final   Parainfluenza Virus 2 NOT DETECTED NOT DETECTED Final   Parainfluenza Virus 3 NOT DETECTED NOT DETECTED Final   Parainfluenza Virus 4 NOT DETECTED NOT DETECTED Final   Respiratory Syncytial Virus NOT DETECTED NOT DETECTED Final   Bordetella pertussis NOT DETECTED NOT DETECTED Final   Chlamydophila pneumoniae NOT DETECTED NOT DETECTED Final   Mycoplasma pneumoniae NOT DETECTED NOT DETECTED Final     Radiological Exams on Admission: Dg Chest Port 1 View  Result Date: 04/29/2017 CLINICAL DATA:  41 y/o  F; shortness of breath and weakness. EXAM: PORTABLE CHEST 1 VIEW COMPARISON:  04/24/2017 chest radiograph. FINDINGS: Stable cardiac silhouette given projection and technique. Blunted left costal diaphragmatic angle, probably prominent epicardial fat pad. Consider lateral radiographs to exclude small effusion. Otherwise clear lungs. Bones are unremarkable. IMPRESSION: Blunted left costal diaphragmatic angle, probably prominent epicardial fat pad. Consider lateral radiographs to exclude small effusion. Otherwise clear lungs. Electronically Signed   By: Kristine Garbe M.D.   On: 04/29/2017 19:29    EKG: Independently reviewed. Sinus rhythm, non-specific anterolateral T-wave abnormality.   Assessment/Plan  1. COPD with acute exacerbation; acute on chronic hypoxic/hypercarbic respiratory failure  - Pt has severe COPD with steroid-dependency, using 3 Lpm and nocturnal BiPAP at home, now presenting with acute on chronic SOB and  increased O2-requirement, satting only 75% on her usual 3 Lpm   - CXR appears clear, ?of effusion, likely epicardial fat pad, will follow-up with 2-view - ABG did not crossover, notable for pH 7.32 and pCO2 >100  - PCCM was consulted, evaluated pt in ED, left recommendations, much appreciated  - Plan to continue BiPAP prn and qHS, continue Spiriva, Daliresp, Symbicort, and prednisone 5 mg qD  - Minimize narcotic and sedating-agents  - D-dimer elevated with b/l LE dopplers pending; if venous dopplers neg, CTA chest will be ordered  2. Chronic diastolic CHF  - Difficult to determine fluid status d/t body-habitus, appears euvolemic   - TTE (02/01/17) with EF 41-63%, grade 1 diastolic dysfunction, mild LAE, and mild TR  - Managed at home with torsemide, will continue  - SLIV, follow daily wts and I/O's    3. Insulin-dependent DM  - A1c was 6.8% earlier this month  - Managed at home with Lantus 25 units qD and Humalog 0-15 TID  - Check CBG with meals and qHS - Continue Lantus 25 units qD, start Novolog 3 units with meals, and per moderate-intensity SSI    4. OSA  - Using BiPAP qHS at home, will continue with home settings    5. Leukocytosis  - WBC is 14,700 on admission - Possibly secondary to steroids; CXR clear, no other localizing sxs  - Culture if febrile, repeat CBC with diff in am    DVT prophylaxis: sq Lovenox   Code Status: Full  Family Communication: Discussed with patient Disposition Plan: Admit to SDU Consults called: PCCM  Admission status: Inpatient    Vianne Bulls, MD [facility] Pager [phone]  If 7PM-7AM, please contact night-coverage [email or web address] Password [facility]  04/29/2017, 10:19 PM

## 2017-04-29 NOTE — ED Provider Notes (Signed)
[facility] DEPT Provider Note   CSN: [id number] Arrival date & time: 04/29/17  1832     History   Chief Complaint Chief Complaint  Patient presents with  . Shortness of Breath    HPI ADIVA BOETTNER is a 41 y.o. female history of CHF, COPD on 3 L nasal cannula at baseline, cirrhosis, here presenting with shortness of breath. Patient was admitted to the hospital for COPD exacerbation. She was discharged 2 days ago on her home dose of prednisone 5 mg daily. Patient states that since yesterday she has been having worsening shortness of breath. She woke up this morning had trouble breathing. She was noted to be 75% on 4 L [place] at triage. Patient also has bad neuropathy in her legs but denies leg swelling  The history is provided by the patient.    Past Medical History:  Diagnosis Date  . Axillary adenopathy    right axillary adenopathy noted on CT chest (03/06/2012)  . Chronic right-sided CHF (congestive heart failure) ([place]) 03/23/2012   with cor pulmonale. Last RHC 03/2012  . COPD (chronic obstructive pulmonary disease) ([place])    PFTS 03/08/12: fev1 0.58L/1%, FVC 1.18/33%, Ratop 49 and c/w ssevere obstruction. 21% BD response on FVC, RV 219%, DLCO 11/54%  . DVT of upper extremity (deep vein thrombosis) [facility]) April 2013   right subclavian // Unclear precipitating cause - possibly significant right heart failure, with vascular stasis potentially predisposing to clotting.    . Exertional shortness of breath   . Hepatic cirrhosis ([place])    Questionable history of - Noted on CT abdomen (03/2012) - thought to be due to vascular congestion from right heart failure +/- patient's history of alcohol abuse  . History of alcoholism ([place])   . History of chronic bronchitis   . Irregular menses   . Migraine    "~ 1/yr" (05/03/2013)  . Moderate to severe pulmonary hypertension [facility]) April 2013   Cardiac cath on 03/06/12 - 1. Elevated pulmonary artery pressures, right sided filling pressures.,  2. PA:  64/45 (mean 53)    . On home oxygen therapy    "2-3 L 24/7" (05/03/2013)  . OSA (obstructive sleep apnea)    wears noctural BiPAP (05/03/2013)  . Periodontal disease   . Pneumonia ~ 1985; 01/2011  . Pulmonary embolus [facility]) April 2013   Precipitating cause unclear. Was treated with coumadin from April-June 2013.  . Pulmonary hypertension [facility])     Patient Active Problem List   Diagnosis Date Noted  . Acute exacerbation of chronic obstructive pulmonary disease (COPD) ([place]) 04/24/2017  . 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 Left ankle sprain 04/21/2017  . Fungemia 02/13/2017  . COPD (chronic obstructive pulmonary disease) ([place]) 02/13/2017  . Healthcare-associated pneumonia 02/09/2017  . Alcohol abuse 01/31/2017  . Acute on chronic respiratory failure with hypoxia and hypercapnia (HCC)   . Tracheomalacia   . Palliative care by specialist   . Anxiety state   . Advance directive declined by patient   . Goals of care, counseling/discussion   . Chronic hoarseness 07/27/2016  . Diabetic neuropathy ([place]) 04/30/2016  . Diabetes mellitus type 2 in obese ([place]) 04/10/2016  . COPD exacerbation ([place]) 04/01/2016  . Chronic diastolic heart failure ([place]) 01/25/2016  . Chronic respiratory failure with hypoxia ([place]) 01/21/2016  . PAH (pulmonary artery hypertension) ([employer])   . Constipation 11/18/2015  . Morbid obesity ([place]) 11/18/2015  . Lung nodule 01/30/2015  . Pulmonary mass 01/13/2015  . Gastroparesis   . Esophageal  reflux   . Obstipation   . Hypokalemia 02/28/2013  . Financial difficulties 02/28/2013  . Obesity hypoventilation syndrome ([place]) 02/28/2013  . Myalgia 02/28/2013  . Anovulation 06/30/2012  . Preventative health care 05/05/2012  . Irregular menstrual cycle 04/27/2012  . Chronic right-sided CHF (congestive heart failure) ([place]) 03/23/2012  . Pulmonary hypertension ([place]) 03/23/2012  . Cor pulmonale ([place]) 03/21/2012  . OSA on BiPAP 03/19/2012  . Axillary lymphadenopathy  03/06/2012  . Congenital heart disease 01/28/2012  . Dental caries 01/28/2012    Past Surgical History:  Procedure Laterality Date  . APPENDECTOMY  05/03/2013  . CARDIAC CATHETERIZATION  03/2012  . CARDIAC CATHETERIZATION N/A 01/08/2016   Procedure: Right Heart Cath;  Surgeon: Jolaine Artist, MD;  Location: [facility];  Service: Cardiovascular;  Laterality: N/A;  . CARDIAC SURGERY  1977/02/10   "my heart was backwards" (05/03/2013)  . LAPAROSCOPIC APPENDECTOMY N/A 05/03/2013   Procedure: APPENDECTOMY LAPAROSCOPIC;  Surgeon: Gwenyth Ober, MD;  Location: [facility];  Service: General;  Laterality: N/A;  . LUNG SURGERY  12/02/77  . RIGHT HEART CATHETERIZATION N/A 03/07/2012   Procedure: RIGHT HEART CATH;  Surgeon: Burnell Blanks, MD;  Location: [facility] CATH LAB;  Service: Cardiovascular;  Laterality: N/A;    OB History    No data available       Home Medications    Prior to Admission medications   Medication Sig Start Date End Date Taking? Authorizing Provider  acetaminophen-codeine (TYLENOL #3) 300-30 MG tablet Take 1-2 tablets by mouth every 6 (six) hours as needed for moderate pain. 04/28/17   Golden Circle, FNP  albuterol (PROVENTIL HFA;VENTOLIN HFA) 108 (90 BASE) MCG/ACT inhaler Inhale 2 puffs into the lungs every 6 (six) hours as needed for wheezing or shortness of breath. 09/06/14   Brand Males, MD  Blood Glucose Monitoring Suppl (ONE TOUCH ULTRA 2) w/Device KIT USE TO CHECK BLOOD SUGARS TWICE A DAY 04/21/17   Golden Circle, FNP  budesonide-formoterol (SYMBICORT) 160-4.5 MCG/ACT inhaler Inhale 2 puffs into the lungs 2 (two) times daily. 03/28/17   Erick Colace, NP  gabapentin (NEURONTIN) 300 MG capsule TAKE 2 CAPSULES BY MOUTH 3 TIMES DAILY. 04/21/17   Golden Circle, FNP  guaiFENesin (MUCINEX) 600 MG 12 hr tablet Take 1 tablet (600 mg total) by mouth 2 (two) times daily. 02/11/17   Barton Dubois, MD  HYDROcodone-acetaminophen (NORCO/VICODIN) 5-325 MG  tablet Take 1-2 tablets by mouth every 6 (six) hours as needed. 04/20/17   Horton, Barbette Hair, MD  insulin glargine (LANTUS) 100 UNIT/ML injection Inject 0.25 mLs (25 Units total) into the skin daily. 03/29/17   Hongalgi, Lenis Dickinson, MD  insulin lispro (HUMALOG) 100 UNIT/ML injection Inject 0-0.15 mLs (0-15 Units total) into the skin 3 (three) times daily with meals. CBG < 70: Eat or drink something right away and recheck. CBG 70 - 120: 0 units CBG 121 - 150: 2 units CBG 151 - 200: 3 units CBG 201 - 250: 5 units CBG 251 - 300: 8 units CBG 301 - 350: 11 units CBG 351 - 400: 15 units CBG > 400: call MD. 03/28/17   Modena Jansky, MD  pantoprazole (PROTONIX) 40 MG tablet Take 1 tablet (40 mg total) by mouth 2 (two) times daily before a meal. Patient taking differently: Take 40 mg by mouth daily.  07/22/16   Kelvin Cellar, MD  potassium chloride SA (K-DUR,KLOR-CON) 20 MEQ tablet Take 1 tablet (20 mEq total) by mouth daily. 01/29/16  Bensimhon, Shaune Pascal, MD  predniSONE (DELTASONE) 5 MG tablet Resume 5 mg daily after you have completed the 39m tab taper 03/28/17   BErick Colace NP  pregabalin (LYRICA) 50 MG capsule Take 2 capsules (100 mg total) by mouth 3 (three) times daily. 04/21/17   CGolden Circle FNP  roflumilast (DALIRESP) 500 MCG TABS tablet Take 500 mcg by mouth daily.    [provider]  Tiotropium Bromide Monohydrate (SPIRIVA RESPIMAT) 1.25 MCG/ACT AERS Inhale 2 puffs into the lungs daily. 04/06/17   RBrand Males MD  torsemide (DEMADEX) 20 MG tablet Take 1 tablet (20 mg total) by mouth daily. 12/03/16   RBrand Males MD  traMADol (ULTRAM) 50 MG tablet TAKE 1 TABLET EVERY 6 HOURS AS NEEDED FOR SEVERE PAIN 04/21/17   CGolden Circle FNP    Family History Family History  Problem Relation Age of Onset  . Hypertension Father   . Heart disease Father        CHF; died age 40  . Alcohol abuse Father   . Other Brother        died age 40y.o overdose    Social  History Social History  Substance Use Topics  . Smoking status: Current Some Day Smoker    Packs/day: 1.00    Years: 25.00    Types: Cigarettes    Last attempt to quit: 02/18/2012  . Smokeless tobacco: Never Used     Comment: smokes 1-2 cigs per day (03/14/17)  . Alcohol use 0.6 oz/week    1 Shots of liquor per week     Allergies   Patient has no known allergies.   Review of Systems Review of Systems  Respiratory: Positive for shortness of breath.   All other systems reviewed and are negative.    Physical Exam Updated Vital Signs BP (!) 134/94 (BP Location: Left Arm)   Pulse 94   Temp 97.3 F (36.3 C) (Temporal)   Resp 14   LMP 04/20/2017 (Approximate)   SpO2 100%   Physical Exam  Constitutional: She is oriented to person, place, and time.  Tachypneic   HENT:  Head: Normocephalic.  Mouth/Throat: Oropharynx is clear and moist.  Eyes: Conjunctivae and EOM are normal. Pupils are equal, round, and reactive to light.  Neck: Normal range of motion. Neck supple.  Cardiovascular: Normal rate, regular rhythm and normal heart sounds.   Pulmonary/Chest:  Tachypneic, diminished throughout, mild wheezing throughout   Abdominal: Soft. Bowel sounds are normal.  Musculoskeletal: Normal range of motion.  No edema, no calf tenderness   Neurological: She is alert and oriented to person, place, and time. No cranial nerve deficit. Coordination normal.  Skin: Skin is warm.  Psychiatric: She has a normal mood and affect.  Nursing note and vitals reviewed.    ED Treatments / Results  Labs (all labs ordered are listed, but only abnormal results are displayed) Labs Reviewed  CBC WITH DIFFERENTIAL/PLATELET  COMPREHENSIVE METABOLIC PANEL  BRAIN NATRIURETIC PEPTIDE  BLOOD GAS, ARTERIAL  I-STAT TROPOININ, ED    EKG  EKG Interpretation  Date/Time:  Friday Apr 29 2017 18:37:06 EDT Ventricular Rate:  90 PR Interval:    QRS Duration: 80 QT Interval:  365 QTC Calculation: 447 R  Axis:   102 Text Interpretation:  Sinus rhythm Borderline right axis deviation Abnormal lateral Q waves Abnrm T, consider ischemia, anterolateral lds No significant change since last tracing Confirmed by Landrey Mahurin  MD, Pryce Folts ([id number] on 04/29/2017 7:09:36 PM  Radiology No results found.  Procedures Procedures (including critical care time)  CRITICAL CARE Performed by: Wandra Arthurs   Total critical care time: 30 minutes  Critical care time was exclusive of separately billable procedures and treating other patients.  Critical care was necessary to treat or prevent imminent or life-threatening deterioration.  Critical care was time spent personally by me on the following activities: development of treatment plan with patient and/or surrogate as well as nursing, discussions with consultants, evaluation of patient's response to treatment, examination of patient, obtaining history from patient or surrogate, ordering and performing treatments and interventions, ordering and review of laboratory studies, ordering and review of radiographic studies, pulse oximetry and re-evaluation of patient's condition.   Medications Ordered in ED Medications  magnesium sulfate IVPB 2 g 50 mL (2 g Intravenous New Bag/Given 04/29/17 1902)  methylPREDNISolone sodium succinate (SOLU-MEDROL) 125 mg/2 mL injection 125 mg (125 mg Intravenous Given 04/29/17 1902)  albuterol (PROVENTIL,VENTOLIN) solution continuous neb (15 mg/hr Nebulization Given 04/29/17 1847)  ipratropium (ATROVENT) nebulizer solution 0.5 mg (0.5 mg Nebulization Given 04/29/17 1847)  fentaNYL (SUBLIMAZE) injection 25 mcg (25 mcg Intravenous Given 04/29/17 1903)     Initial Impression / Assessment and Plan / ED Course  I have reviewed the triage vital signs and the nursing notes.  Pertinent labs & imaging results that were available during my care of the patient were reviewed by me and considered in my medical decision making (see chart for  details).    TALANI BRAZEE is a 41 y.o. female here with shortness of breath. Recent admission for COPD exacerbation. Hypoxic to 75% on 4L [place]. Will put on continuous neb, solumedrol, magnesium. Will get ABG.   8:27 PM ABG showed pH 7.32. CO2 >95. Limited sample. Likely compensated. Will start bipap. Consulted ICU to see patient.   9:36 PM ICU saw patient. Felt that patient stable for stepdown. Will admit to stepdown.   Final Clinical Impressions(s) / ED Diagnoses   Final diagnoses:  None    New Prescriptions New Prescriptions   No medications on file     Drenda Freeze, MD 04/29/17 2141

## 2017-04-30 ENCOUNTER — Inpatient Hospital Stay (HOSPITAL_COMMUNITY): Payer: 59

## 2017-04-30 ENCOUNTER — Encounter (HOSPITAL_COMMUNITY): Payer: 59

## 2017-04-30 ENCOUNTER — Encounter (HOSPITAL_COMMUNITY): Payer: Self-pay | Admitting: Radiology

## 2017-04-30 DIAGNOSIS — J9622 Acute and chronic respiratory failure with hypercapnia: Secondary | ICD-10-CM

## 2017-04-30 DIAGNOSIS — J9621 Acute and chronic respiratory failure with hypoxia: Secondary | ICD-10-CM

## 2017-04-30 DIAGNOSIS — J9611 Chronic respiratory failure with hypoxia: Secondary | ICD-10-CM

## 2017-04-30 DIAGNOSIS — I5032 Chronic diastolic (congestive) heart failure: Secondary | ICD-10-CM

## 2017-04-30 DIAGNOSIS — J441 Chronic obstructive pulmonary disease with (acute) exacerbation: Principal | ICD-10-CM

## 2017-04-30 DIAGNOSIS — T380X5A Adverse effect of glucocorticoids and synthetic analogues, initial encounter: Secondary | ICD-10-CM

## 2017-04-30 DIAGNOSIS — E099 Drug or chemical induced diabetes mellitus without complications: Secondary | ICD-10-CM

## 2017-04-30 DIAGNOSIS — D72829 Elevated white blood cell count, unspecified: Secondary | ICD-10-CM

## 2017-04-30 DIAGNOSIS — E662 Morbid (severe) obesity with alveolar hypoventilation: Secondary | ICD-10-CM

## 2017-04-30 DIAGNOSIS — G4733 Obstructive sleep apnea (adult) (pediatric): Secondary | ICD-10-CM

## 2017-04-30 LAB — BASIC METABOLIC PANEL
Anion gap: 11 (ref 5–15)
BUN: 16 mg/dL (ref 6–20)
CALCIUM: 9 mg/dL (ref 8.9–10.3)
CHLORIDE: 85 mmol/L — AB (ref 101–111)
CO2: 39 mmol/L — ABNORMAL HIGH (ref 22–32)
Creatinine, Ser: 0.76 mg/dL (ref 0.44–1.00)
GFR calc non Af Amer: >60 mL/min (ref >60)
Glucose, Bld: 286 mg/dL — ABNORMAL HIGH (ref 65–99)
Potassium: 4.7 mmol/L (ref 3.5–5.1)
SODIUM: 135 mmol/L (ref 135–145)

## 2017-04-30 LAB — COMPREHENSIVE METABOLIC PANEL
ALT: 104 U/L — ABNORMAL HIGH (ref 14–54)
AST: 46 U/L — ABNORMAL HIGH (ref 15–41)
Albumin: 3.9 g/dL (ref 3.5–5.0)
Alkaline Phosphatase: 100 U/L (ref 38–126)
Anion gap: 11 (ref 5–15)
BILIRUBIN TOTAL: 1.2 mg/dL (ref 0.3–1.2)
BUN: 12 mg/dL (ref 6–20)
CO2: 38 mmol/L — ABNORMAL HIGH (ref 22–32)
Calcium: 9.5 mg/dL (ref 8.9–10.3)
Chloride: 86 mmol/L — ABNORMAL LOW (ref 101–111)
Creatinine, Ser: 0.66 mg/dL (ref 0.44–1.00)
GFR calc Af Amer: >60 mL/min (ref >60)
Glucose, Bld: 256 mg/dL — ABNORMAL HIGH (ref 65–99)
POTASSIUM: 4.7 mmol/L (ref 3.5–5.1)
Sodium: 135 mmol/L (ref 135–145)
TOTAL PROTEIN: 6.8 g/dL (ref 6.5–8.1)

## 2017-04-30 LAB — GLUCOSE, CAPILLARY
GLUCOSE-CAPILLARY: 247 mg/dL — AB (ref 65–99)
GLUCOSE-CAPILLARY: 120 mg/dL — AB (ref 65–99)
GLUCOSE-CAPILLARY: 264 mg/dL — AB (ref 65–99)
Glucose-Capillary: 99 mg/dL (ref 65–99)

## 2017-04-30 LAB — CBC WITH DIFFERENTIAL/PLATELET
BASOS PCT: 0 %
Basophils Absolute: 0 10*3/uL (ref 0.0–0.1)
EOS ABS: 0 10*3/uL (ref 0.0–0.7)
Eosinophils Relative: 0 %
HCT: 47.8 % — ABNORMAL HIGH (ref 36.0–46.0)
HEMOGLOBIN: 14.1 g/dL (ref 12.0–15.0)
LYMPHS ABS: 0.5 10*3/uL — AB (ref 0.7–4.0)
Lymphocytes Relative: 4 %
MCH: 31.3 pg (ref 26.0–34.0)
MCHC: 29.5 g/dL — ABNORMAL LOW (ref 30.0–36.0)
MCV: 106.2 fL — ABNORMAL HIGH (ref 78.0–100.0)
Monocytes Absolute: 0.2 10*3/uL (ref 0.1–1.0)
Monocytes Relative: 1 %
NEUTROS PCT: 95 %
Neutro Abs: 12.6 10*3/uL — ABNORMAL HIGH (ref 1.7–7.7)
Platelets: 160 10*3/uL (ref 150–400)
RBC: 4.5 MIL/uL (ref 3.87–5.11)
RDW: 14.8 % (ref 11.5–15.5)
WBC: 13.3 10*3/uL — AB (ref 4.0–10.5)

## 2017-04-30 LAB — MRSA PCR SCREENING: MRSA BY PCR: NEGATIVE

## 2017-04-30 LAB — D-DIMER, QUANTITATIVE: D-Dimer, Quant: 2.86 ug/mL-FEU — ABNORMAL HIGH (ref 0.00–0.50)

## 2017-04-30 MED ORDER — PREGABALIN 100 MG PO CAPS
100.0000 mg | ORAL_CAPSULE | Freq: Every day | ORAL | Status: DC
  Administered 2017-04-30 – 2017-05-02 (×3): 100 mg via ORAL
  Filled 2017-04-30 (×2): qty 1
  Filled 2017-04-30: qty 2

## 2017-04-30 MED ORDER — INSULIN ASPART 100 UNIT/ML ~~LOC~~ SOLN: 3.0000 [IU] | Freq: Three times a day (TID) | SUBCUTANEOUS | Status: DC

## 2017-04-30 MED ORDER — INSULIN GLARGINE 100 UNIT/ML ~~LOC~~ SOLN
25.0000 [IU] | Freq: Every day | SUBCUTANEOUS | Status: DC
  Administered 2017-04-30 – 2017-05-03 (×4): 25 [IU] via SUBCUTANEOUS
  Filled 2017-04-30 (×4): qty 0.25

## 2017-04-30 MED ORDER — GABAPENTIN 300 MG PO CAPS
600.0000 mg | ORAL_CAPSULE | Freq: Three times a day (TID) | ORAL | Status: DC
  Administered 2017-04-30 – 2017-05-03 (×10): 600 mg via ORAL
  Filled 2017-04-30 (×10): qty 2

## 2017-04-30 MED ORDER — INSULIN ASPART 100 UNIT/ML ~~LOC~~ SOLN
0.0000 [IU] | Freq: Three times a day (TID) | SUBCUTANEOUS | Status: DC
Start: 2017-04-30 — End: 2017-05-03
  Administered 2017-04-30: 5 [IU] via SUBCUTANEOUS
  Administered 2017-05-01: 8 [IU] via SUBCUTANEOUS
  Administered 2017-05-01 – 2017-05-02 (×2): 3 [IU] via SUBCUTANEOUS
  Administered 2017-05-02: 5 [IU] via SUBCUTANEOUS
  Administered 2017-05-02 – 2017-05-03 (×2): 2 [IU] via SUBCUTANEOUS

## 2017-04-30 MED ORDER — IOPAMIDOL (ISOVUE-370) INJECTION 76%
INTRAVENOUS | Status: AC
  Administered 2017-04-30: 100 mL
  Filled 2017-04-30: qty 100

## 2017-04-30 MED ORDER — INSULIN ASPART 100 UNIT/ML ~~LOC~~ SOLN
8.0000 [IU] | Freq: Three times a day (TID) | SUBCUTANEOUS | Status: DC
  Administered 2017-04-30 – 2017-05-02 (×6): 8 [IU] via SUBCUTANEOUS

## 2017-04-30 MED ORDER — INSULIN ASPART 100 UNIT/ML ~~LOC~~ SOLN
0.0000 [IU] | Freq: Every day | SUBCUTANEOUS | Status: DC
  Administered 2017-04-30: 3 [IU] via SUBCUTANEOUS
  Administered 2017-05-01: 4 [IU] via SUBCUTANEOUS

## 2017-04-30 NOTE — Evaluation (Signed)
Physical Therapy Evaluation Patient Details Name: Mary HakimMichelle L [name] MRN: [id number] DOB: 12/09/1975 Today's Date: 04/30/2017   History of Present Illness  Pt is a 41 yo female admitted through ED on 04/29/17 with an acute COPD exacerbation following a reaction to narcotic pain medication. Pt was suffering with neuropathic pain and requested to increase her medication, MD ordered Tylenol 3 and pt took this medication and fell asleep for over 18 hours without her Bipap resulting in an increase in CO2. PMH significant for multiple admissions in the past 6 months including one 04/25/17 for similar issues, severe COPD, O2 dependent on 4L O2, steroid dependent, ongoign tobacco abuse,alcohol use, DM2, chornic CHF.  Clinical Impression  Pt presents with the above diagnosis and below deficits for therapy evaluation. Prior to admission, pt had just returned home from recent hospitalization on 04/27/13 and returned on 04/29/17 due to COPD exacerbation. Pt is able to perform mobility with supervision to min guard this session and is expected to progress. Pt does desat with mobility to 84% and quickly improved to 99% with rest. Pt will benefit from continued acute rehab to address the below deficits prior to DC to venue recommended below.     Follow Up Recommendations Home health PT;Supervision - Intermittent    Equipment Recommendations  None recommended by PT    Recommendations for Other Services       Precautions / Restrictions Precautions Precautions: Fall Precaution Comments: Desats quickly with activity Restrictions Weight Bearing Restrictions: No      Mobility  Bed Mobility Overal bed mobility: Modified Independent                Transfers Overall transfer level: Needs assistance Equipment used: None Transfers: Sit to/from Stand;Stand Pivot Transfers Sit to Stand: Supervision Stand pivot transfers: Min guard       General transfer comment: Supervision for sit to stand and min  guard for stand pivot [facility] to [facility] to recliner  Ambulation/Gait             General Gait Details: deferred this sessiondue to pain from neuropathy  Stairs            Wheelchair Mobility    Modified Rankin (Stroke Patients Only)       Balance                                             Pertinent Vitals/Pain Pain Assessment: No/denies pain    Home Living Family/patient expects to be discharged to:: Private residence Living Arrangements: Spouse/significant other Available Help at Discharge: Family;Available PRN/intermittently Type of Home: House Home Access: Stairs to enter Entrance Stairs-Rails: Right Entrance Stairs-Number of Steps: 3 Home Layout: One level 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lectric scooter;Wheelchair - manual Additional Comments: w/c doesn't have foot rests; uses motorized scooter at [employer]    Prior Function Level of Independence: Independent;Independent with assistive device(s)         Comments: home alone during the day     Hand Dominance   Dominant Hand: Right    Extremity/Trunk Assessment   Upper Extremity Assessment Upper Extremity Assessment: Defer to OT evaluation    Lower Extremity Assessment Lower Extremity Assessment: LLE deficits/detail LLE Deficits / Details: L ankle swollen and ecchycomotic; pt reports she sprained her ankle about 1 week prior       Communication   Communication: No difficulties  Cognition Arousal/Alertness: Awake/alert Behavior During Therapy: WFL for tasks assessed/performed Overall Cognitive Status: Within Functional Limits for tasks assessed                                        General Comments      Exercises     Assessment/Plan    PT Assessment Patient needs continued PT services  PT Problem List Decreased strength;Decreased range of motion;Decreased activity tolerance;Decreased balance;Decreased mobility;Decreased knowledge of use of  DME;Decreased knowledge of precautions;Cardiopulmonary status limiting activity;Pain       PT Treatment Interventions DME instruction;Gait training;Stair training;Functional mobility training;Therapeutic activities;Therapeutic exercise;Patient/family education    PT Goals (Current goals can be found in the Care Plan section)  Acute Rehab PT Goals Patient Stated Goal: ankle to heal; be able to get new CPAP PT Goal Formulation: With patient Time For Goal Achievement: 05/07/17 Potential to Achieve Goals: Good    Frequency Min 3X/week   Barriers to discharge Inaccessible home environment      Co-evaluation               AM-PAC PT "6 Clicks" Daily Activity  Outcome Measure Difficulty turning over in bed (including adjusting bedclothes, sheets and blankets)?: None Difficulty moving from lying on back to sitting on the side of the bed? : None Difficulty sitting down on and standing up from a chair with arms (e.g., wheelchair, bedside commode, etc,.)?: None Help needed moving to and from a bed to chair (including a wheelchair)?: None Help needed walking in hospital room?: A Little Help needed climbing 3-5 steps with a railing? : A Little 6 Click Score: 22    End of Session Equipment Utilized During Treatment: Gait belt;Oxygen Activity Tolerance: Patient tolerated treatment well Patient left: in chair;with call bell/phone within reach Nurse Communication: Mobility status PT Visit Diagnosis: Other (comment);Other abnormalities of gait and mobility (R26.89)    Time: 1610-9604 PT Time Calculation (min) (ACUTE ONLY): 34 min   Charges:   PT Evaluation $PT Eval Moderate Complexity: 1 Procedure PT Treatments $Therapeutic Activity: 8-22 mins   PT G Codes:        Colin Broach PT, DPT  [phone]   Roxy Manns 04/30/2017, 4:15 PM

## 2017-04-30 NOTE — Progress Notes (Signed)
Patient has home unit in room. Patient states she is able to place self on and off of BIPAP when ready. RT informed patient if she has any trouble have RN contact RT.

## 2017-04-30 NOTE — Progress Notes (Signed)
PROGRESS NOTE    Mary [name]  ZOX:[id number]  DOB: [birth year]  DOA: 04/29/2017 PCP: Veryl Speak, FNP Outpatient Specialists:   Hospital course: Mary [name] is a 41 y.o. female with medical history significant for steroid dependent COPD with chronic 3 L/m supplemental oxygen requirement, chronic diastolic CHF, history of DVT, steroid-induced diabetes with neuropathy, obesity hypoventilation syndrome, and obstructive sleep apnea using BiPAP at home, now presenting to the emergency department with acute on chronic dyspnea.   Assessment & Plan:   1. COPD with acute exacerbation; acute on chronic hypoxic/hypercarbic respiratory failure  - Pt has severe COPD with steroid-dependency, using 3 Lpm and nocturnal BiPAP at home, now presenting with acute on chronic SOB and increased O2-requirement, satting only 75% on her usual 3 Lpm  - CXR appears clear, ?of effusion, likely epicardial fat pad, will follow-up with 2-view - ABG did not crossover, notable for pH 7.32 and pCO2 >100  - PCCM was consulted and following - Continue BiPAP prn and qHS, continue Spiriva, Daliresp, Symbicort, and prednisone 5 mg qD  - Minimize narcotic and sedating-agents  - Markedly elevated D dimer will  Get CTA chest PE protocol   2. Chronic diastolic CHF  - Difficult to determine fluid status d/t body-habitus, appears euvolemic   - TTE (02/01/17) with EF 65-70%, grade 1 diastolic dysfunction, mild LAE, and mild TR  - Managed at home with torsemide, will continue  - SLIV, follow daily wts and I/O's    3. Insulin-dependent DM  - A1c was 6.8% earlier this month  - Managed at home with Lantus 25 units qD,  - Check CBG with meals and qHS - Continue Lantus 25 units qD, Novolog 8 units with meals, and per moderate-intensity SSI    CBG (last 3)   Recent Labs  04/27/17 1157 04/27/17 1621  GLUCAP 238* 283*   4. OSA  - Using BiPAP qHS at home, will continue with home settings    5.  Leukocytosis  - WBC is 14,700 on admission - Possibly secondary to steroids; CXR clear, no other localizing sxs  - Culture if febrile, repeat CBC with diff in am   6. Severe diabetic peripheral neuropathy - resume home gabapentin, lyrica nightly, avoid opioids.    DVT prophylaxis: sq Lovenox   Code Status: Full  Family Communication: Discussed with patient Disposition Plan: Admit to SDU Consults called: PCCM  Admission status: Inpatient   Consultants: PCCM  Subjective: Pt still with SOB but some improvement noted.   Objective: Vitals:   04/30/17 0400 04/30/17 0500 04/30/17 0840 04/30/17 0846  BP: 102/78 100/88 125/77   Pulse: 87 87 91   Resp: 18 14 (!) 27   Temp: 98.4 F (36.9 C)  97.5 F (36.4 C)   TempSrc:   Axillary   SpO2: 96% 94% 100% 94%  Weight:  92.5 kg (203 lb 14.8 oz)    Height:        Intake/Output Summary (Last 24 hours) at 04/30/17 0914 Last data filed at 04/29/17 2300  Gross per 24 hour  Intake              270 ml  Output              200 ml  Net               70 ml   Filed Weights   04/29/17 2017 04/30/17 0500  Weight: 94.3 kg (208 lb) 92.5 kg (203 lb 14.8 oz)  Exam:  General exam: awake, alert, NAD.  Respiratory system: shallow BS bilateral. Mild increased work of breathing. Cardiovascular system: S1 & S2 heard, RRR. No JVD, murmurs, gallops, clicks or pedal edema. Gastrointestinal system: Abdomen is nondistended, soft and nontender. Normal bowel sounds heard. Central nervous system: Alert and oriented. No focal neurological deficits. Extremities: no cyanosis.  Data Reviewed: Basic Metabolic Panel:  Recent Labs Lab 04/24/17 0950 04/25/17 0532 04/26/17 0740 04/29/17 1900 04/30/17 0247  NA 139 135 136 136 135  K 3.5 4.2 4.5 5.0 4.7  CL 85* 83* 85* 84* 85*  CO2 45* 43* 42* 40* 39*  GLUCOSE 156* 340* 216* 179* 286*  BUN <5* 9 11 22* 16  CREATININE 0.56 0.53 0.53 0.83 0.76  CALCIUM 8.8* 8.7* 9.1 9.2 9.0  MG 1.8  --   --   --    --    Liver Function Tests:  Recent Labs Lab 04/24/17 0950 04/29/17 1900  AST 72* 84*  ALT 113* 141*  ALKPHOS 115 113  BILITOT 1.3* 1.4*  PROT 6.5 6.9  ALBUMIN 3.6 4.1   No results for input(s): LIPASE, AMYLASE in the last 168 hours. No results for input(s): AMMONIA in the last 168 hours. CBC:  Recent Labs Lab 04/24/17 0950 04/25/17 0532 04/29/17 1900 04/30/17 0247  WBC 9.6 9.1 14.7* 13.3*  NEUTROABS 6.7  --  11.2* 12.6*  HGB 14.1 13.3 15.0 14.1  HCT 46.6* 44.3 49.9* 47.8*  MCV 103.8* 104.0* 105.5* 106.2*  PLT 132* 130* 182 160   Cardiac Enzymes: No results for input(s): CKTOTAL, CKMB, CKMBINDEX, TROPONINI in the last 168 hours. CBG (last 3)   Recent Labs  04/27/17 1157 04/27/17 1621  GLUCAP 238* 283*   Recent Results (from the past 240 hour(s))  Respiratory Panel by PCR     Status: None   Collection Time: 04/24/17  8:41 PM  Result Value Ref Range Status   Adenovirus NOT DETECTED NOT DETECTED Final   Coronavirus 229E NOT DETECTED NOT DETECTED Final   Coronavirus HKU1 NOT DETECTED NOT DETECTED Final   Coronavirus NL63 NOT DETECTED NOT DETECTED Final   Coronavirus OC43 NOT DETECTED NOT DETECTED Final   Metapneumovirus NOT DETECTED NOT DETECTED Final   Rhinovirus / Enterovirus NOT DETECTED NOT DETECTED Final   Influenza A NOT DETECTED NOT DETECTED Final   Influenza B NOT DETECTED NOT DETECTED Final   Parainfluenza Virus 1 NOT DETECTED NOT DETECTED Final   Parainfluenza Virus 2 NOT DETECTED NOT DETECTED Final   Parainfluenza Virus 3 NOT DETECTED NOT DETECTED Final   Parainfluenza Virus 4 NOT DETECTED NOT DETECTED Final   Respiratory Syncytial Virus NOT DETECTED NOT DETECTED Final   Bordetella pertussis NOT DETECTED NOT DETECTED Final   Chlamydophila pneumoniae NOT DETECTED NOT DETECTED Final   Mycoplasma pneumoniae NOT DETECTED NOT DETECTED Final  MRSA PCR Screening     Status: None   Collection Time: 04/29/17 11:27 PM  Result Value Ref Range Status    MRSA by PCR NEGATIVE NEGATIVE Final    Comment:        The GeneXpert MRSA Assay (FDA approved for NASAL specimens only), is one component of a comprehensive MRSA colonization surveillance program. It is not intended to diagnose MRSA infection nor to guide or monitor treatment for MRSA infections.      Studies: Dg Chest Port 1 View  Result Date: 04/29/2017 CLINICAL DATA:  41 y/o  F; shortness of breath and weakness. EXAM: PORTABLE CHEST 1 VIEW COMPARISON:  04/24/2017  chest radiograph. FINDINGS: Stable cardiac silhouette given projection and technique. Blunted left costal diaphragmatic angle, probably prominent epicardial fat pad. Consider lateral radiographs to exclude small effusion. Otherwise clear lungs. Bones are unremarkable. IMPRESSION: Blunted left costal diaphragmatic angle, probably prominent epicardial fat pad. Consider lateral radiographs to exclude small effusion. Otherwise clear lungs. Electronically Signed   By: Mitzi HansenLance  Furusawa-Stratton M.D.   On: 04/29/2017 19:29     Scheduled Meds: . enoxaparin (LOVENOX) injection  40 mg Subcutaneous Daily  . guaiFENesin  600 mg Oral BID  . insulin aspart  0-15 Units Subcutaneous TID WC  . insulin aspart  0-5 Units Subcutaneous QHS  . insulin aspart  8 Units Subcutaneous TID WC  . insulin glargine  25 Units Subcutaneous Daily  . mometasone-formoterol  2 puff Inhalation BID  . pantoprazole  40 mg Oral Daily  . predniSONE  5 mg Oral Q breakfast  . pregabalin  100 mg Oral TID  . roflumilast  500 mcg Oral Daily  . sodium chloride flush  3 mL Intravenous Q12H  . sodium chloride flush  3 mL Intravenous Q12H  . tiotropium  18 mcg Inhalation Daily  . torsemide  20 mg Oral Daily   Continuous Infusions: . sodium chloride      Principal Problem:   Acute exacerbation of chronic obstructive pulmonary disease (COPD) (HCC) Active Problems:   OSA on BiPAP   Obesity hypoventilation syndrome (HCC)   Chronic diastolic heart failure (HCC)    Acute on chronic respiratory failure with hypoxia and hypercapnia (HCC)   Steroid-induced diabetes (HCC)   Leukocytosis   Critical CareTime spent: 38 mins  Standley Dakinslanford Johnson, MD, FAAFP [facility] Pager [phone]  If 7PM-7AM, please contact night-coverage [email or web address] Password [email or web address] 04/30/2017, 9:14 AM    LOS: 1 day

## 2017-04-30 NOTE — Progress Notes (Signed)
Nutrition Brief Note  Patient identified as part of COPD gold protocol  Wt Readings from Last 15 Encounters:  04/30/17 203 lb 14.8 oz (92.5 kg)  04/26/17 208 lb 4.8 oz (94.5 kg)  04/21/17 202 lb (91.6 kg)  04/19/17 203 lb (92.1 kg)  03/28/17 200 lb 11.2 oz (91 kg)  03/14/17 203 lb 6.4 oz (92.3 kg)  02/15/17 209 lb 3.5 oz (94.9 kg)  02/11/17 205 lb 0.4 oz (93 kg)  02/04/17 204 lb (92.5 kg)  01/15/17 201 lb 6.4 oz (91.4 kg)  11/09/16 201 lb 6.4 oz (91.4 kg)  09/07/16 196 lb (88.9 kg)  08/28/16 198 lb 3.2 oz (89.9 kg)  08/13/16 197 lb 8.5 oz (89.6 kg)  07/29/16 199 lb (90.3 kg)   Body mass index is 36.12 kg/m. Patient meets criteria for Obese based on current BMI.   Pt reports that her appetite is great w/ no trouble eating. She volunteers information regarding her diet. She says she doesn't eat fried food, drinks only water w/ exception of 1 soda at night, eats lots of vegetables.   She says she has "always been chubby, but most of this is from the steroids".   RD stated main concern was that she was maintaining and not losing weight due to COPD. She says nutritionally, she has no concerns at all.   Weight history shows wt has been extremely stable.   Current diet order is Carb Modified, patient is consuming approximately 100% of meals at this time.   No nutrition interventions warranted at this time. If nutrition issues arise, please consult RD.   Christophe LouisNathan Chancy Smigiel RD, LDN, CNSC Clinical Nutrition Pager: [phone] 04/30/2017 11:56 AM

## 2017-04-30 NOTE — Progress Notes (Signed)
Name: Mary [name] MRN: [id number] DOB: 02/26/1976    ADMISSION DATE:  04/29/2017 CONSULTATION DATE:  04/29/2017  REFERRING MD :  Dr. Silverio LayYao  CHIEF COMPLAINT:  Somnolence  HISTORY OF PRESENT ILLNESS:   41 CF with neuropathy (likely diabetic and long term previous EtOH use), DM, Pulmonary hypertension (WHO III) and very severe COPD (on 3L home O2) (followed by Dr. Marchelle Gearingamaswamy).  [facility].  She was home for two days and started on tylenol #3 by her PCP in addition to her neuropathy regimen (Lyrica, Neurontin, tramadol).  She states that she was extremely somnolent while at home.  She denied any SOB, CP, DOE, n/v/d, fevers, cough, LE edema.    She states she took 3 tylenol #3 each day for the last two days in addition to the above regimen.  She denies other sedating medications as well as EtOH.  She states today she slept most of the day and did not get up until the afternoon of admit  when her boyfriend came home (even though she slept all night and all day yesterday).  She states she has also been having muscle jerks that are new for her.  She did say that she has been using her home Bipap at night and with all daytime naps.  Her new settings after her recent hospitalization are BiPAP 16/5.  SUBJECTIVE:   Improved today with improved mentation  o2 sats good on [place] , no increased wob.  Wore BIPAP all night.  Complaining on neuropathic pain in legs = baseline   VITAL SIGNS: Temp:  [97.3 F (36.3 C)-98.4 F (36.9 C)] 97.5 F (36.4 C) (05/26 0840) Pulse Rate:  [85-94] 91 (05/26 0840) Resp:  [12-27] 27 (05/26 0840) BP: (98-135)/(57-94) 125/77 (05/26 0840) SpO2:  [75 %-100 %] 94 % (05/26 0846) FiO2 (%):  [40 %-60 %] 40 % (05/25 2323) Weight:  [203 lb 14.8 oz (92.5 kg)-208 lb (94.3 kg)] 203 lb 14.8 oz (92.5 kg) (05/26 0500)  PHYSICAL EXAMINATION: General:  AAOx3, NAD Neuro:  CN II-XII intact, + asterixis, + tremulousness HEENT:  NCAT,  PERLA Cardiovascular:   Distant Heart sounds Lungs: with distant bs, no wheezes Abdomen:  Large, non distended, normal bowel sounds, no TTP or guarding Musculoskeletal:  TTP LLE, normal bulk and tone Skin:  LLE 1-2 pitting edema with ecchymosis left toes, feet, ankle upto knee with posterior calf tenderness.    Recent Labs Lab 04/29/17 1900 04/30/17 0247 04/30/17 1002  NA 136 135 135  K 5.0 4.7 4.7  CL 84* 85* 86*  CO2 40* 39* 38*  BUN 22* 16 12  CREATININE 0.83 0.76 0.66  GLUCOSE 179* 286* 256*    Recent Labs Lab 04/25/17 0532 04/29/17 1900 04/30/17 0247  HGB 13.3 15.0 14.1  HCT 44.3 49.9* 47.8*  WBC 9.1 14.7* 13.3*  PLT 130* 182 160   Dg Chest Port 1 View  Result Date: 04/29/2017 CLINICAL DATA:  41 y/o  F; shortness of breath and weakness. EXAM: PORTABLE CHEST 1 VIEW COMPARISON:  04/24/2017 chest radiograph. FINDINGS: Stable cardiac silhouette given projection and technique. Blunted left costal diaphragmatic angle, probably prominent epicardial fat pad. Consider lateral radiographs to exclude small effusion. Otherwise clear lungs. Bones are unremarkable. IMPRESSION: Blunted left costal diaphragmatic angle, probably prominent epicardial fat pad. Consider lateral radiographs to exclude small effusion. Otherwise clear lungs. Electronically Signed   By: Mitzi HansenLance  Furusawa-Stratton M.D.   On: 04/29/2017 19:29    ASSESSMENT /  PLAN:  41 yo CF with OSA, OHS, very severe COPD p/w somnolence which is likely due to the addition of another sedating agent (new narcotic) in the setting of her already extensive regimen with CNS depressants and concurrent COPD with chronic CO2 retention.  It is likely that even the slightest respiratory depressive effect produced a worsening hypercapnia which can worsen sedation and produce a self perpetuating cycle.  This hypercapnia would also explain her neurologic findings representing hypercapnic narcosis. COPD appears stable without flare .  Her  pulmonary hypertension is well compensated and she is euvolemic at this time, not a candidate for pulmonary vasodilators at present time, can revisit as outpatient, though data for the use of this in patients with WHO III are not good. She was seen by [facility] services and is not a candidate for transplantation. She is still smoking. Smoking cessation discussed, not willing to commit She has had Alpha-1 Antitrypsin testing and is Genotype PiMM with normal serum levels.  Recommend: - continue home spiriva, roflumilast, symbicort and prednisone 5mg  Qday - Recommend d/c or at least  dose of narcotics - Recommend consideration of a less sedating neuropathy regimen - Continue BiPAP 16/8 tonight and with all naps. Cont w/ follow up with pulmonary in OP setting .  - Dimer positive> V doppler and CT chest PE protocol pending > continue lovenox prophylactic doses for now     Pt seen and examined independently and  Noted edited/ updated and agree with above imp/plan    Sandrea Hughs, MD Pulmonary and Critical Care Medicine [facility] Cell [phone] After 5:30 PM or weekends, use [facility] [phone]

## 2017-04-30 NOTE — Progress Notes (Signed)
PT Cancellation Note  Patient Details Name: Mary HakimMichelle L [name] MRN: [id number] DOB: 10/06/1976   Cancelled Treatment:    Reason Eval/Treat Not Completed: Patient at procedure or test/unavailable. Pt at a diagnostic testing. Will check back later as time allows.    Colin BroachSabra M. Rajon Bisig PT, DPT  [phone]  04/30/2017, 1:53 PM

## 2017-05-01 ENCOUNTER — Inpatient Hospital Stay (HOSPITAL_COMMUNITY): Payer: 59

## 2017-05-01 DIAGNOSIS — R7989 Other specified abnormal findings of blood chemistry: Secondary | ICD-10-CM

## 2017-05-01 DIAGNOSIS — M7989 Other specified soft tissue disorders: Secondary | ICD-10-CM

## 2017-05-01 LAB — CBC WITH DIFFERENTIAL/PLATELET
BASOS ABS: 0 10*3/uL (ref 0.0–0.1)
BASOS PCT: 0 %
Eosinophils Absolute: 0.1 10*3/uL (ref 0.0–0.7)
Eosinophils Relative: 0 %
HEMATOCRIT: 42.8 % (ref 36.0–46.0)
HEMOGLOBIN: 12.9 g/dL (ref 12.0–15.0)
Lymphocytes Relative: 16 %
Lymphs Abs: 2 10*3/uL (ref 0.7–4.0)
MCH: 31.2 pg (ref 26.0–34.0)
MCHC: 30.1 g/dL (ref 30.0–36.0)
MCV: 103.6 fL — ABNORMAL HIGH (ref 78.0–100.0)
Monocytes Absolute: 1 10*3/uL (ref 0.1–1.0)
Monocytes Relative: 8 %
NEUTROS ABS: 9.3 10*3/uL — AB (ref 1.7–7.7)
NEUTROS PCT: 76 %
Platelets: 155 10*3/uL (ref 150–400)
RBC: 4.13 MIL/uL (ref 3.87–5.11)
RDW: 14.4 % (ref 11.5–15.5)
WBC: 12.4 10*3/uL — ABNORMAL HIGH (ref 4.0–10.5)

## 2017-05-01 LAB — GLUCOSE, CAPILLARY
GLUCOSE-CAPILLARY: 292 mg/dL — AB (ref 65–99)
GLUCOSE-CAPILLARY: 78 mg/dL (ref 65–99)
GLUCOSE-CAPILLARY: 78 mg/dL (ref 65–99)
Glucose-Capillary: 155 mg/dL — ABNORMAL HIGH (ref 65–99)
Glucose-Capillary: 318 mg/dL — ABNORMAL HIGH (ref 65–99)

## 2017-05-01 NOTE — Progress Notes (Signed)
Pt states she takes Lyrica TID at home.  Clarified order with Dr. Laural BenesJohnson that pt cannot take Lyrica TID.  Pt only to receive Gabapentin TID.  Will continue to monitor.

## 2017-05-01 NOTE — Plan of Care (Signed)
Problem: Pain Managment: Goal: General experience of comfort will improve Outcome: Progressing Has neuropathy, on home regimen  Problem: Respiratory: Goal: Levels of oxygenation will improve Outcome: Progressing O2 dependent at baseline

## 2017-05-01 NOTE — Progress Notes (Signed)
Received [facility] to [facility], VSS, patient c/o severe neuropathy which is not new.  Not happy that MD declined to give her Neurontin and Lyrica at the same time, this was addressed earlier in shift.  Did administer patients Neurontin as ordered.

## 2017-05-01 NOTE — Progress Notes (Signed)
VASCULAR LAB PRELIMINARY  PRELIMINARY  PRELIMINARY  PRELIMINARY  Left lower extremity venous duplex completed.    Preliminary report:   There is no DVT or SVT noted in the left lower extremity.   Kell Ferris, RVT 05/01/2017, 4:04 PM

## 2017-05-01 NOTE — Progress Notes (Signed)
Set up hospital BIPAP for patient after complaints of desaturation on her home BIPAP last night. Patient states that she will place it on. RT will monitor as needed.

## 2017-05-01 NOTE — Progress Notes (Signed)
PROGRESS NOTE    Mary [name]  ZOX:[id number]  DOB: 04/17/1976  DOA: 04/29/2017 PCP: Veryl Speak, FNP Outpatient Specialists:   Hospital course: Mary [name] is a 41 y.o. female with medical history significant for steroid dependent COPD with chronic 3 L/m supplemental oxygen requirement, chronic diastolic CHF, history of DVT, steroid-induced diabetes with neuropathy, obesity hypoventilation syndrome, and obstructive sleep apnea using BiPAP at home, now presenting to the emergency department with acute on chronic dyspnea.  Assessment & Plan:   1. COPD with acute exacerbation; acute on chronic hypoxic/hypercarbic respiratory failure  - Pt has severe COPD with steroid-dependency, using 3 Lpm and nocturnal BiPAP at home, now presenting with acute on chronic SOB and increased O2-requirement, satting only 75% on her usual 3 Lpm  - CXR appears clear, ?of effusion, likely epicardial fat pad, will follow-up with 2-view - ABG did not crossover, notable for pH 7.32 and pCO2 >100  - PCCM was consulted and following - Continue BiPAP prn and qHS, continue Spiriva, Daliresp, Symbicort, and prednisone 5 mg qD  - Minimize narcotic and sedating-agents  - Markedly elevated D dimer,  CTA chest PE protocol negative, no pneumonia seen.   2. Chronic diastolic CHF  stable - Difficult to determine fluid status d/t body-habitus, appears euvolemic   - TTE (02/01/17) with EF 65-70%, grade 1 diastolic dysfunction, mild LAE, and mild TR  - Managed at home with torsemide, will continue  - SLIV, follow daily wts and I/O's    3. Insulin-dependent DM type 2 - A1c was 6.8% earlier this month  - Managed at home with Lantus 25 units qD,  - Check CBG with meals and qHS - Continue Lantus 25 units qD, Novolog 8 units with meals, and per moderate-intensity SSI    CBG (last 3)   Recent Labs  04/30/17 1600 04/30/17 1601 04/30/17 2236  GLUCAP 99 120* 264*   4. OSA  - Using BiPAP qHS at home,  will continue with home settings, pulmonology following  5. Leukocytosis  - WBC is 14,700 on admission - Possibly secondary to steroids; CXR clear, no other localizing sxs  - Culture if febrile, repeat CBC with diff in am   6. Severe diabetic peripheral neuropathy - resume home gabapentin, lyrica nightly, avoid opioids.    DVT prophylaxis: sq Lovenox   Code Status: Full  Family Communication: Discussed with patient Disposition Plan: TTF, possibly home 5/28 Consults called: PCCM  Admission status: Inpatient   Consultants: PCCM  Subjective: Pt still with SOB but some improvement noted.   Objective: Vitals:   04/30/17 2300 05/01/17 0005 05/01/17 0300 05/01/17 0814  BP: 128/78 128/78 122/76   Pulse: (!) 102 (!) 109 79   Resp: 17 18 13    Temp: 97.8 F (36.6 C)  97.7 F (36.5 C)   TempSrc: Oral  Oral   SpO2: 95% 93% 99% 98%  Weight:   93.3 kg (205 lb 11 oz)   Height:       No intake or output data in the 24 hours ending 05/01/17 0836 Filed Weights   04/29/17 2017 04/30/17 0500 05/01/17 0300  Weight: 94.3 kg (208 lb) 92.5 kg (203 lb 14.8 oz) 93.3 kg (205 lb 11 oz)    Exam:  General exam: awake, alert, NAD.  Respiratory system: shallow BS bilateral. Mild increased work of breathing. Cardiovascular system: S1 & S2 heard, RRR. No JVD, murmurs, gallops, clicks or pedal edema. Gastrointestinal system: Abdomen is nondistended, soft and nontender. Normal bowel sounds  heard. Central nervous system: Alert and oriented. No focal neurological deficits. Extremities: no cyanosis.  Data Reviewed: Basic Metabolic Panel:  Recent Labs Lab 04/24/17 0950 04/25/17 0532 04/26/17 0740 04/29/17 1900 04/30/17 0247 04/30/17 1002  NA 139 135 136 136 135 135  K 3.5 4.2 4.5 5.0 4.7 4.7  CL 85* 83* 85* 84* 85* 86*  CO2 45* 43* 42* 40* 39* 38*  GLUCOSE 156* 340* 216* 179* 286* 256*  BUN <5* 9 11 22* 16 12  CREATININE 0.56 0.53 0.53 0.83 0.76 0.66  CALCIUM 8.8* 8.7* 9.1 9.2 9.0  9.5  MG 1.8  --   --   --   --   --    Liver Function Tests:  Recent Labs Lab 04/24/17 0950 04/29/17 1900 04/30/17 1002  AST 72* 84* 46*  ALT 113* 141* 104*  ALKPHOS 115 113 100  BILITOT 1.3* 1.4* 1.2  PROT 6.5 6.9 6.8  ALBUMIN 3.6 4.1 3.9   No results for input(s): LIPASE, AMYLASE in the last 168 hours. No results for input(s): AMMONIA in the last 168 hours. CBC:  Recent Labs Lab 04/24/17 0950 04/25/17 0532 04/29/17 1900 04/30/17 0247 05/01/17 0236  WBC 9.6 9.1 14.7* 13.3* 12.4*  NEUTROABS 6.7  --  11.2* 12.6* 9.3*  HGB 14.1 13.3 15.0 14.1 12.9  HCT 46.6* 44.3 49.9* 47.8* 42.8  MCV 103.8* 104.0* 105.5* 106.2* 103.6*  PLT 132* 130* 182 160 155   Cardiac Enzymes: No results for input(s): CKTOTAL, CKMB, CKMBINDEX, TROPONINI in the last 168 hours. CBG (last 3)   Recent Labs  04/30/17 1600 04/30/17 1601 04/30/17 2236  GLUCAP 99 120* 264*   Recent Results (from the past 240 hour(s))  Respiratory Panel by PCR     Status: None   Collection Time: 04/24/17  8:41 PM  Result Value Ref Range Status   Adenovirus NOT DETECTED NOT DETECTED Final   Coronavirus 229E NOT DETECTED NOT DETECTED Final   Coronavirus HKU1 NOT DETECTED NOT DETECTED Final   Coronavirus NL63 NOT DETECTED NOT DETECTED Final   Coronavirus OC43 NOT DETECTED NOT DETECTED Final   Metapneumovirus NOT DETECTED NOT DETECTED Final   Rhinovirus / Enterovirus NOT DETECTED NOT DETECTED Final   Influenza A NOT DETECTED NOT DETECTED Final   Influenza B NOT DETECTED NOT DETECTED Final   Parainfluenza Virus 1 NOT DETECTED NOT DETECTED Final   Parainfluenza Virus 2 NOT DETECTED NOT DETECTED Final   Parainfluenza Virus 3 NOT DETECTED NOT DETECTED Final   Parainfluenza Virus 4 NOT DETECTED NOT DETECTED Final   Respiratory Syncytial Virus NOT DETECTED NOT DETECTED Final   Bordetella pertussis NOT DETECTED NOT DETECTED Final   Chlamydophila pneumoniae NOT DETECTED NOT DETECTED Final   Mycoplasma pneumoniae NOT  DETECTED NOT DETECTED Final  MRSA PCR Screening     Status: None   Collection Time: 04/29/17 11:27 PM  Result Value Ref Range Status   MRSA by PCR NEGATIVE NEGATIVE Final    Comment:        The GeneXpert MRSA Assay (FDA approved for NASAL specimens only), is one component of a comprehensive MRSA colonization surveillance program. It is not intended to diagnose MRSA infection nor to guide or monitor treatment for MRSA infections.      Studies: Dg Chest 2 View  Result Date: 04/30/2017 CLINICAL DATA:  Acute respiratory failure. EXAM: CHEST  2 VIEW COMPARISON:  Radiographs of Apr 29, 2017. CT scan of February 01, 2017. FINDINGS: Stable cardiomediastinal silhouette and central pulmonary vascular congestion.  No pneumothorax is noted. No significant pleural effusion is noted. Right lung is clear. Mild left basilar atelectasis is noted. Bony thorax is unremarkable. IMPRESSION: Mild left basilar subsegmental atelectasis. Stable central pulmonary vascular congestion. Electronically Signed   By: Lupita Raider, M.D.   On: 04/30/2017 12:37   Ct Angio Chest Pe W Or Wo Contrast  Result Date: 04/30/2017 CLINICAL DATA:  Acute exacerbation of COPD, positive D-dimer. EXAM: CT ANGIOGRAPHY CHEST WITH CONTRAST TECHNIQUE: Multidetector CT imaging of the chest was performed using the standard protocol during bolus administration of intravenous contrast. Multiplanar CT image reconstructions and MIPs were obtained to evaluate the vascular anatomy. CONTRAST:  100 cc Isovue 370 COMPARISON:  Chest CT dated 02/01/2017. FINDINGS: Cardiovascular: Some of the most peripheral segmental and subsegmental pulmonary artery branches are difficult to definitively characterize due to mild patient breathing motion artifact but there is no pulmonary embolism identified within the main, lobar or central segmental pulmonary arteries bilaterally. Thoracic aorta is normal in caliber. No aortic dissection. Heart size is normal. No  pericardial effusion. Mediastinum/Nodes: No mass or enlarged lymph nodes within the mediastinum or perihilar regions. Esophagus appears normal. Trachea and central bronchi are unremarkable. Lungs/Pleura: Mosaic pattern within the upper lungs bilaterally suggesting air trapping. No evidence of pneumonia. No pleural effusion or pneumothorax seen. Upper Abdomen: Liver is low in density suggesting fatty infiltration. No acute findings. Musculoskeletal: No acute or suspicious osseous finding. Review of the MIP images confirms the above findings. IMPRESSION: 1. No pulmonary embolism seen, with mild study limitations detailed above. 2. Evidence of air trapping within the upper lungs bilaterally, compatible with the given history of COPD. No evidence of pneumonia or pulmonary edema. 3. Probable fatty infiltration of the liver. Electronically Signed   By: Bary Richard M.D.   On: 04/30/2017 22:12   Dg Chest Port 1 View  Result Date: 04/29/2017 CLINICAL DATA:  41 y/o  F; shortness of breath and weakness. EXAM: PORTABLE CHEST 1 VIEW COMPARISON:  04/24/2017 chest radiograph. FINDINGS: Stable cardiac silhouette given projection and technique. Blunted left costal diaphragmatic angle, probably prominent epicardial fat pad. Consider lateral radiographs to exclude small effusion. Otherwise clear lungs. Bones are unremarkable. IMPRESSION: Blunted left costal diaphragmatic angle, probably prominent epicardial fat pad. Consider lateral radiographs to exclude small effusion. Otherwise clear lungs. Electronically Signed   By: Mitzi Hansen M.D.   On: 04/29/2017 19:29     Scheduled Meds: . enoxaparin (LOVENOX) injection  40 mg Subcutaneous Daily  . gabapentin  600 mg Oral TID  . guaiFENesin  600 mg Oral BID  . insulin aspart  0-15 Units Subcutaneous TID WC  . insulin aspart  0-5 Units Subcutaneous QHS  . insulin aspart  8 Units Subcutaneous TID WC  . insulin glargine  25 Units Subcutaneous Daily  .  mometasone-formoterol  2 puff Inhalation BID  . pantoprazole  40 mg Oral Daily  . predniSONE  5 mg Oral Q breakfast  . pregabalin  100 mg Oral QHS  . roflumilast  500 mcg Oral Daily  . sodium chloride flush  3 mL Intravenous Q12H  . sodium chloride flush  3 mL Intravenous Q12H  . tiotropium  18 mcg Inhalation Daily  . torsemide  20 mg Oral Daily   Continuous Infusions: . sodium chloride      Principal Problem:   Acute exacerbation of chronic obstructive pulmonary disease (COPD) (HCC) Active Problems:   OSA on BiPAP   Obesity hypoventilation syndrome (HCC)   Chronic diastolic heart  failure (HCC)   Acute on chronic respiratory failure with hypoxia and hypercapnia (HCC)   Steroid-induced diabetes (HCC)   Leukocytosis   Critical CareTime spent: 34 mins  Standley Dakinslanford Othar Curto, MD, FAAFP [facility] Pager [phone]  If 7PM-7AM, please contact night-coverage [email or web address] Password [email or web address] 05/01/2017, 8:36 AM    LOS: 2 days

## 2017-05-01 NOTE — Progress Notes (Signed)
Name: Jana HakimMichelle L Glosser MRN: [id number] DOB: 08/08/1976    ADMISSION DATE:  04/29/2017 CONSULTATION DATE:  04/29/2017  REFERRING MD :  Dr. Silverio LayYao  CHIEF COMPLAINT:  Somnolence  HISTORY OF PRESENT ILLNESS:   [age] CF with neuropathy (likely diabetic and long term previous EtOH use), DM, Pulmonary hypertension (WHO III) and very severe COPD (on 3L home O2) (followed by Dr. Marchelle Gearingamaswamy).  [facility].  She was home for two days and started on tylenol #3 by her PCP in addition to her neuropathy regimen (Lyrica, Neurontin, tramadol).  She states that she was extremely somnolent while at home.  She denied any SOB, CP, DOE, n/v/d, fevers, cough, LE edema.    She states she took 3 tylenol #3 each day for the last two days in addition to the above regimen.  She denies other sedating medications as well as EtOH.  She states today she slept most of the day and did not get up until the afternoon of admit  when her boyfriend came home (even though she slept all night and all day yesterday).  She states she has also been having muscle jerks that are new for her.  She did say that she has been using her home Bipap at night and with all daytime naps.  Her new settings after her recent hospitalization are BiPAP 16/5.  SUBJECTIVE:   not happy with settings on her own bipap machine with no RT input (last note from RT indicates pt informed to have nursing call if any problems with bipap but apparently that did not happen ? Why? (discussed with nursing from day shift to make aware that troubleshooting with bipap device/ mask needs to happen or she likely will be right back admitted like she was before)    VITAL SIGNS: Temp:  [97.7 F (36.5 C)-98.4 F (36.9 C)] 98.3 F (36.8 C) (05/27 0900) Pulse Rate:  [79-109] 91 (05/27 1100) Resp:  [13-25] 19 (05/27 1100) BP: (113-130)/(75-98) 130/75 (05/27 0900) SpO2:  [92 %-99 %] 96 % (05/27 1100) Weight:  [205 lb 11 oz (93.3 kg)] 205  lb 11 oz (93.3 kg) (05/27 0300)  FIO2  3lpm   PHYSICAL EXAMINATION: General:  AAOx3, NAD/ hopeless helpless affect  Neuro:  CN II-XII intact, + asterixis, + tremulousness HEENT:  NCAT, PERLA Cardiovascular:   Distant Heart sounds Lungs: with distant bs, no wheezes Abdomen: very obese/ soft,  , normal bowel sounds, no TTP or guarding Musculoskeletal:  TTP LLE, normal bulk and tone Skin:  LLE trace to 1+ sym pitting edema with ecchymosis left toes, feet, ankle upto knee with posterior calf tenderness.    Recent Labs Lab 04/29/17 1900 04/30/17 0247 04/30/17 1002  NA 136 135 135  K 5.0 4.7 4.7  CL 84* 85* 86*  CO2 40* 39* 38*  BUN 22* 16 12  CREATININE 0.83 0.76 0.66  GLUCOSE 179* 286* 256*    Recent Labs Lab 04/29/17 1900 04/30/17 0247 05/01/17 0236  HGB 15.0 14.1 12.9  HCT 49.9* 47.8* 42.8  WBC 14.7* 13.3* 12.4*  PLT 182 160 155   Dg Chest 2 View  Result Date: 04/30/2017 CLINICAL DATA:  Acute respiratory failure. EXAM: CHEST  2 VIEW COMPARISON:  Radiographs of Apr 29, 2017. CT scan of February 01, 2017. FINDINGS: Stable cardiomediastinal silhouette and central pulmonary vascular congestion. No pneumothorax is noted. No significant pleural effusion is noted. Right lung is clear. Mild left basilar atelectasis is noted. Bony thorax  is unremarkable. IMPRESSION: Mild left basilar subsegmental atelectasis. Stable central pulmonary vascular congestion. Electronically Signed   By: Lupita Raider, M.D.   On: 04/30/2017 12:37   Ct Angio Chest Pe W Or Wo Contrast  Result Date: 04/30/2017 CLINICAL DATA:  Acute exacerbation of COPD, positive D-dimer. EXAM: CT ANGIOGRAPHY CHEST WITH CONTRAST TECHNIQUE: Multidetector CT imaging of the chest was performed using the standard protocol during bolus administration of intravenous contrast. Multiplanar CT image reconstructions and MIPs were obtained to evaluate the vascular anatomy. CONTRAST:  100 cc Isovue 370 COMPARISON:  Chest CT dated  02/01/2017. FINDINGS: Cardiovascular: Some of the most peripheral segmental and subsegmental pulmonary artery branches are difficult to definitively characterize due to mild patient breathing motion artifact but there is no pulmonary embolism identified within the main, lobar or central segmental pulmonary arteries bilaterally. Thoracic aorta is normal in caliber. No aortic dissection. Heart size is normal. No pericardial effusion. Mediastinum/Nodes: No mass or enlarged lymph nodes within the mediastinum or perihilar regions. Esophagus appears normal. Trachea and central bronchi are unremarkable. Lungs/Pleura: Mosaic pattern within the upper lungs bilaterally suggesting air trapping. No evidence of pneumonia. No pleural effusion or pneumothorax seen. Upper Abdomen: Liver is low in density suggesting fatty infiltration. No acute findings. Musculoskeletal: No acute or suspicious osseous finding. Review of the MIP images confirms the above findings. IMPRESSION: 1. No pulmonary embolism seen, with mild study limitations detailed above. 2. Evidence of air trapping within the upper lungs bilaterally, compatible with the given history of COPD. No evidence of pneumonia or pulmonary edema. 3. Probable fatty infiltration of the liver. Electronically Signed   By: Bary Richard M.D.   On: 04/30/2017 22:12   Dg Chest Port 1 View  Result Date: 04/29/2017 CLINICAL DATA:  41 y/o  F; shortness of breath and weakness. EXAM: PORTABLE CHEST 1 VIEW COMPARISON:  04/24/2017 chest radiograph. FINDINGS: Stable cardiac silhouette given projection and technique. Blunted left costal diaphragmatic angle, probably prominent epicardial fat pad. Consider lateral radiographs to exclude small effusion. Otherwise clear lungs. Bones are unremarkable. IMPRESSION: Blunted left costal diaphragmatic angle, probably prominent epicardial fat pad. Consider lateral radiographs to exclude small effusion. Otherwise clear lungs. Electronically Signed   By:  Mitzi Hansen M.D.   On: 04/29/2017 19:29    ASSESSMENT / PLAN:  41 yo CF with OSA, OHS, very severe COPD p/w somnolence which is likely due to the addition of another sedating agent (new narcotic) in the setting of her already extensive regimen with CNS depressants and concurrent COPD with chronic CO2 retention and ? bipap set up optimally.   It is likely that even the slightest respiratory depressive effect produced a worsening hypercapnia which can worsen sedation and produce a self perpetuating cycle.  This hypercapnia would also explain her neurologic findings representing hypercapnic narcosis.  COPD appears stable without flare .   Her pulmonary hypertension is well compensated and she is euvolemic at this time, not a candidate for pulmonary vasodilators at present time, can revisit as outpatient, though data for the use of this in patients with WHO III are not good. She was seen by [facility] services and is not a candidate for transplantation. She is still smoking. Smoking cessation discussed  She has had Alpha-1 Antitrypsin testing and is Genotype PiMM with normal serum levels.  Recommend: - continue home spiriva, roflumilast, symbicort and prednisone 5mg  Qday - Recommend d/c or at least  dose of narcotics - Recommend consideration of a less sedating neuropathy regimen -  Continue BiPAP  From home and let RT troubleshoot the problem while she's still inpt . Cont w/ follow up with pulmonary in OP setting .  - Dimer positive>  CT chest PE neg/ awaiting venous dopplers  > continue lovenox prophylactic doses for now          Sandrea Hughs, MD Pulmonary and Critical Care Medicine [facility] Cell [phone] After 5:30 PM or weekends, use Beeper [phone]

## 2017-05-02 LAB — GLUCOSE, CAPILLARY
GLUCOSE-CAPILLARY: 188 mg/dL — AB (ref 65–99)
Glucose-Capillary: 243 mg/dL — ABNORMAL HIGH (ref 65–99)
Glucose-Capillary: 122 mg/dL — ABNORMAL HIGH (ref 65–99)
Glucose-Capillary: 189 mg/dL — ABNORMAL HIGH (ref 65–99)

## 2017-05-02 MED ORDER — INSULIN ASPART 100 UNIT/ML ~~LOC~~ SOLN
10.0000 [IU] | Freq: Three times a day (TID) | SUBCUTANEOUS | Status: DC
  Administered 2017-05-02 – 2017-05-03 (×2): 10 [IU] via SUBCUTANEOUS

## 2017-05-02 MED ORDER — ACETAMINOPHEN 325 MG PO TABS
650.0000 mg | ORAL_TABLET | Freq: Four times a day (QID) | ORAL | Status: DC | PRN
  Administered 2017-05-02 – 2017-05-03 (×2): 650 mg via ORAL
  Filled 2017-05-02 (×2): qty 2

## 2017-05-02 NOTE — Discharge Instructions (Signed)
Seek Medical Care or Return if symptoms worsen or new problems develop.

## 2017-05-02 NOTE — Progress Notes (Signed)
Physical Therapy Treatment Patient Details Name: Mary [name] MRN: [id number] DOB: 09/19/1976 Today's Date: 05/02/2017    History of Present Illness Pt is a 41 yo female admitted through ED on 04/29/17 with an acute COPD exacerbation following a reaction to narcotic pain medication. Pt was suffering with neuropathic pain and requested to increase her medication, MD ordered Tylenol 3 and pt took this medication and fell asleep for over 18 hours without her Bipap resulting in an increase in CO2. PMH significant for multiple admissions in the past 6 months including one 04/25/17 for similar issues, severe COPD, O2 dependent on 4L O2, steroid dependent, ongoign tobacco abuse,alcohol use, DM2, chornic CHF.    PT Comments    Patient is progressing well toward mobility goals. Pt tolerated increased gait distance without AD and min guard assist. SpO2 89-91% on 3L O2 via [place] when ambulating and with stair negotiation. Continue to progress as tolerated with anticipated d/c home with HHPT.   Follow Up Recommendations  Home health PT;Supervision - Intermittent     Equipment Recommendations  None recommended by PT    Recommendations for Other Services       Precautions / Restrictions Precautions Precautions: Fall Restrictions Weight Bearing Restrictions: No    Mobility  Bed Mobility Overal bed mobility: Modified Independent             General bed mobility comments: increased effort  Transfers Overall transfer level: Needs assistance Equipment used: None Transfers: Sit to/from Stand Sit to Stand: Supervision         General transfer comment: supervision for safety  Ambulation/Gait Ambulation/Gait assistance: Min guard Ambulation Distance (Feet): 100 Feet Assistive device: None Gait Pattern/deviations: Step-through pattern;Decreased stride length     General Gait Details: SOB and with one standing rest break requred; cues for pursed lip breathing technique; slow, grossly  steady gait; min guard for safety due to mild unsteadiness with horizontal head turns   Stairs Stairs: Yes   Stair Management: One rail Left;Step to pattern Number of Stairs: 3 General stair comments: cues for step to pattern and sequencing  Wheelchair Mobility    Modified Rankin (Stroke Patients Only)       Balance                                            Cognition Arousal/Alertness: Awake/alert Behavior During Therapy: WFL for tasks assessed/performed Overall Cognitive Status: Within Functional Limits for tasks assessed                                        Exercises      General Comments General comments (skin integrity, edema, etc.): 2L O2 via [place] beginning and end of session; SpO2 89-91% on 3L O2 via [place] with ambulation      Pertinent Vitals/Pain Pain Assessment: Faces Faces Pain Scale: Hurts little more Pain Location: bilat feet Pain Descriptors / Indicators: Sore Pain Intervention(s): Limited activity within patient's tolerance;Monitored during session;Repositioned;Premedicated before session    Home Living                      Prior Function            PT Goals (current goals can now be found in the care plan section) Acute Rehab  PT Goals Patient Stated Goal: ankle to heal; be able to get new CPAP PT Goal Formulation: With patient Time For Goal Achievement: 05/07/17 Potential to Achieve Goals: Good Progress towards PT goals: Progressing toward goals    Frequency    Min 3X/week      PT Plan Current plan remains appropriate    Co-evaluation              AM-PAC PT "6 Clicks" Daily Activity  Outcome Measure  Difficulty turning over in bed (including adjusting bedclothes, sheets and blankets)?: None Difficulty moving from lying on back to sitting on the side of the bed? : None Difficulty sitting down on and standing up from a chair with arms (e.g., wheelchair, bedside commode, etc,.)?:  None Help needed moving to and from a bed to chair (including a wheelchair)?: None Help needed walking in hospital room?: A Little Help needed climbing 3-5 steps with a railing? : A Little 6 Click Score: 22    End of Session Equipment Utilized During Treatment: Gait belt;Oxygen Activity Tolerance: Patient tolerated treatment well Patient left: in chair;with call bell/phone within reach Nurse Communication: Mobility status PT Visit Diagnosis: Other (comment);Other abnormalities of gait and mobility (R26.89)     Time: 9604-5409 PT Time Calculation (min) (ACUTE ONLY): 24 min  Charges:  $Gait Training: 8-22 mins $Therapeutic Activity: 8-22 mins                    G Codes:       Mary [name], PTA Pager: [phone]     Mary [name] 05/02/2017, 9:46 AM

## 2017-05-02 NOTE — Evaluation (Signed)
Occupational Therapy Evaluation Patient Details Name: Mary HakimMichelle L [name] MRN: [id number] DOB: 09/05/1976 Today's Date: 05/02/2017    History of Present Illness Pt is a 41 yo female admitted through ED on 04/29/17 with an acute COPD exacerbation following a reaction to narcotic pain medication. Pt was suffering with neuropathic pain and requested to increase her medication, MD ordered Tylenol 3 and pt took this medication and fell asleep for over 18 hours without her Bipap resulting in an increase in CO2. PMH significant for multiple admissions in the past 6 months including one 04/25/17 for similar issues, severe COPD, O2 dependent on 4L O2, steroid dependent, ongoign tobacco abuse,alcohol use, DM2, chornic CHF.   Clinical Impression   This 41 y/o F presents with the above. Pt lives with spouse, and reports at baseline she is independent with functional mobility, is mostly independent with ADLs with occasional assist from spouse for LB ADLs and bathing. Pt completed functional mobility this session at supervision level and requires MinA for LB ADLs. Education provided throughout on energy conservation techniques for completing ADL tasks with handout provided. Pt will benefit from continued OT services to maximize activity tolerance, safety and independence with ADLs and functional mobility. Will follow.     Follow Up Recommendations  Home health OT;Supervision - Intermittent    Equipment Recommendations  None recommended by OT           Precautions / Restrictions Precautions Precautions: Fall Restrictions Weight Bearing Restrictions: No      Mobility Bed Mobility Overal bed mobility: Modified Independent             General bed mobility comments: HOB elevated   Transfers Overall transfer level: Needs assistance Equipment used: None Transfers: Sit to/from Stand Sit to Stand: Supervision         General transfer comment: supervision for safety    Balance Overall balance  assessment: No apparent balance deficits (not formally assessed)                                         ADL either performed or assessed with clinical judgement   ADL Overall ADL's : Needs assistance/impaired Eating/Feeding: Independent;Sitting   Grooming: Wash/dry hands;Wash/dry face;Oral care;Supervision/safety;Standing   Upper Body Bathing: Set up;Sitting   Lower Body Bathing: Min guard;Sit to/from stand   Upper Body Dressing : Set up;Sitting   Lower Body Dressing: Minimal assistance;Sit to/from stand Lower Body Dressing Details (indicate cue type and reason): Assist to don L sock secondary to L foot/ankle pain; Pt demonstrates figure 4 technique to don R sock  Toilet Transfer: Min guard;Ambulation;Regular Social workerToilet   Toileting- Clothing Manipulation and Hygiene: Min guard;Sit to/from stand       Functional mobility during ADLs: Min guard General ADL Comments: Pt completed room level functional mobility, standing grooming ADLs. Pt currently on 3L continuous O2 with no SOB noted during task completion. Provided education on energy conservation strategies and techniques with handout provided                          Pertinent Vitals/Pain Pain Assessment: Faces Faces Pain Scale: Hurts a little bit Pain Location: bilat feet Pain Descriptors / Indicators: Sore Pain Intervention(s): Limited activity within patient's tolerance;Monitored during session     Hand Dominance Right   Extremity/Trunk Assessment Upper Extremity Assessment Upper Extremity Assessment: Overall WFL for tasks assessed  Lower Extremity Assessment Lower Extremity Assessment: Defer to PT evaluation       Communication Communication Communication: No difficulties   Cognition Arousal/Alertness: Awake/alert Behavior During Therapy: WFL for tasks assessed/performed Overall Cognitive Status: Within Functional Limits for tasks assessed                                      General Comments                  Home Living Family/patient expects to be discharged to:: Private residence Living Arrangements: Spouse/significant other Available Help at Discharge: Family;Available PRN/intermittently Type of Home: House Home Access: Stairs to enter [employer] of Steps: 3 Entrance Stairs-Rails: Right Home Layout: One level     Bathroom Shower/Tub: Chief Strategy Officer: Standard     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;Wheelchair - manual   Additional Comments: w/c doesn't have foot rests; uses motorized scooter at [employer]      Prior Functioning/Environment Level of Independence: Independent        Comments: home alone during the day; reports she typically completes ADLs independently but occasionally needs spouse assistance when she's not feeling well         OT Problem List: Decreased strength;Decreased activity tolerance;Cardiopulmonary status limiting activity      OT Treatment/Interventions: Self-care/ADL training;DME and/or AE instruction;Therapeutic activities;Therapeutic exercise;Energy conservation;Patient/family education    OT Goals(Current goals can be found in the care plan section) Acute Rehab OT Goals Patient Stated Goal: ankle to heal; be able to get new CPAP OT Goal Formulation: With patient Time For Goal Achievement: 05/16/17 Potential to Achieve Goals: Good ADL Goals Pt Will Perform Grooming: with modified independence;standing Pt Will Perform Lower Body Bathing: with supervision;sit to/from stand Pt Will Perform Lower Body Dressing: with supervision;sit to/from stand Pt Will Transfer to Toilet: with modified independence;ambulating;regular height toilet;grab bars Pt/caregiver will Perform Home Exercise Program: Increased strength;Both right and left upper extremity;Independently Additional ADL Goal #1: Pt will independently verbalize at least 3 energy conservation techniques to utilize  during ADL task completion.   OT Frequency: Min 2X/week                             AM-PAC PT "6 Clicks" Daily Activity     Outcome Measure Help from another person eating meals?: None Help from another person taking care of personal grooming?: A Little Help from another person toileting, which includes using toliet, bedpan, or urinal?: A Little Help from another person bathing (including washing, rinsing, drying)?: A Little Help from another person to put on and taking off regular upper body clothing?: None Help from another person to put on and taking off regular lower body clothing?: A Little 6 Click Score: 20   End of Session Equipment Utilized During Treatment: Gait belt Nurse Communication: Mobility status  Activity Tolerance: Patient tolerated treatment well Patient left: in bed;with call bell/phone within reach;with family/visitor present  OT Visit Diagnosis: Muscle weakness (generalized) (M62.81)                Time: 1610-9604 OT Time Calculation (min): 21 min Charges:  OT General Charges $OT Visit: 1 Procedure OT Evaluation $OT Eval Low Complexity: 1 Procedure G-Codes:     Marcy Siren, OT Pager [phone] 05/02/2017   Orlando Penner 05/02/2017, 3:10 PM

## 2017-05-02 NOTE — Progress Notes (Signed)
PROGRESS NOTE    Mary HakimMichelle L Baldridge  ZOX:[id number]  DOB: 04/14/1976  DOA: 04/29/2017 PCP: Veryl Speakalone, Gregory D, FNP Outpatient Specialists:  Hospital course: Mary HakimMichelle L [name] is a 41 y.o. female with medical history significant for steroid dependent COPD with chronic 3 L/m supplemental oxygen requirement, chronic diastolic CHF, history of DVT, steroid-induced diabetes with neuropathy, obesity hypoventilation syndrome, and obstructive sleep apnea using BiPAP at home, now presenting to the emergency department with acute on chronic dyspnea.  Assessment & Plan:   1. COPD with acute exacerbation; acute on chronic hypoxic/hypercarbic respiratory failure  - Pt has severe COPD with steroid-dependency, using 3 Lpm and nocturnal BiPAP at home, now presenting with acute on chronic SOB and increased O2-requirement, satting only 75% on her usual 3 Lpm  - CXR appears clear, ?of effusion, likely epicardial fat pad, will follow-up with 2-view - ABG did not crossover, notable for pH 7.32 and pCO2 >100  - PCCM was consulted and following - Continue BiPAP prn and qHS, continue Spiriva, Daliresp, Symbicort, and prednisone 5 mg qD  - Minimize narcotic and sedating-agents  - Markedly elevated D dimer,  CTA chest PE protocol negative, no pneumonia seen.   2. Chronic diastolic CHF  stable - Difficult to determine fluid status d/t body-habitus, appears euvolemic   - TTE (02/01/17) with EF 65-70%, grade 1 diastolic dysfunction, mild LAE, and mild TR  - Managed at home with torsemide, will continue  - SLIV, follow daily wts and I/O's    3. Insulin-dependent DM type 2 - A1c was 6.8% earlier this month  - Managed at home with Lantus 25 units qD,  - Check CBG with meals and qHS - Continue Lantus 25 units qD, Novolog 10 units with meals, and per moderate-intensity SSI    CBG (last 3)   Recent Labs  05/01/17 2207 05/02/17 0738 05/02/17 1206  GLUCAP 318* 188* 243*   4. OSA  - Using BiPAP qHS at home,  will continue with home settings, pulmonology following  5. Leukocytosis  - WBC is 14,700 on admission - Possibly secondary to steroids; CXR clear, no other localizing sxs  - Culture if febrile, repeat CBC with diff in am   6. Severe diabetic peripheral neuropathy - resume home gabapentin, lyrica nightly, avoid opioids.   DVT prophylaxis: sq Lovenox   Code Status: Full  Family Communication: Discussed with patient Disposition Plan: TTF, possibly home 5/28 Consults called: PCCM  Admission status: Inpatient   Consultants: PCCM  Subjective: Pt says she used the hospital bipap last night otherwise no complaints.   Objective: Vitals:   05/01/17 2026 05/01/17 2137 05/02/17 0652 05/02/17 0909  BP: 127/90  127/77   Pulse: 91  74   Resp: 18  18   Temp: 98.2 F (36.8 C)  97.8 F (36.6 C)   TempSrc: Oral  Oral   SpO2: 94% 95% 100% 95%  Weight:   91.8 kg (202 lb 4.8 oz)   Height:        Intake/Output Summary (Last 24 hours) at 05/02/17 1331 Last data filed at 05/02/17 1322  Gross per 24 hour  Intake              843 ml  Output             1950 ml  Net            -1107 ml   Filed Weights   05/01/17 0300 05/01/17 1456 05/02/17 0652  Weight: 93.3 kg (205 lb 11  oz) 93.1 kg (205 lb 3.2 oz) 91.8 kg (202 lb 4.8 oz)    Exam:  General exam: awake, alert, NAD.  Respiratory system: shallow BS bilateral. No increased work of breathing. Cardiovascular system: S1 & S2 heard, RRR. No JVD, murmurs, gallops, clicks or pedal edema. Gastrointestinal system: Abdomen is nondistended, soft and nontender. Normal bowel sounds heard. Central nervous system: Alert and oriented. No focal neurological deficits. Extremities: no cyanosis.  Data Reviewed: Basic Metabolic Panel:  Recent Labs Lab 04/26/17 0740 04/29/17 1900 04/30/17 0247 04/30/17 1002  NA 136 136 135 135  K 4.5 5.0 4.7 4.7  CL 85* 84* 85* 86*  CO2 42* 40* 39* 38*  GLUCOSE 216* 179* 286* 256*  BUN 11 22* 16 12    CREATININE 0.53 0.83 0.76 0.66  CALCIUM 9.1 9.2 9.0 9.5   Liver Function Tests:  Recent Labs Lab 04/29/17 1900 04/30/17 1002  AST 84* 46*  ALT 141* 104*  ALKPHOS 113 100  BILITOT 1.4* 1.2  PROT 6.9 6.8  ALBUMIN 4.1 3.9   No results for input(s): LIPASE, AMYLASE in the last 168 hours. No results for input(s): AMMONIA in the last 168 hours. CBC:  Recent Labs Lab 04/29/17 1900 04/30/17 0247 05/01/17 0236  WBC 14.7* 13.3* 12.4*  NEUTROABS 11.2* 12.6* 9.3*  HGB 15.0 14.1 12.9  HCT 49.9* 47.8* 42.8  MCV 105.5* 106.2* 103.6*  PLT 182 160 155   Cardiac Enzymes: No results for input(s): CKTOTAL, CKMB, CKMBINDEX, TROPONINI in the last 168 hours. CBG (last 3)   Recent Labs  05/01/17 2207 05/02/17 0738 05/02/17 1206  GLUCAP 318* 188* 243*   Recent Results (from the past 240 hour(s))  Respiratory Panel by PCR     Status: None   Collection Time: 04/24/17  8:41 PM  Result Value Ref Range Status   Adenovirus NOT DETECTED NOT DETECTED Final   Coronavirus 229E NOT DETECTED NOT DETECTED Final   Coronavirus HKU1 NOT DETECTED NOT DETECTED Final   Coronavirus NL63 NOT DETECTED NOT DETECTED Final   Coronavirus OC43 NOT DETECTED NOT DETECTED Final   Metapneumovirus NOT DETECTED NOT DETECTED Final   Rhinovirus / Enterovirus NOT DETECTED NOT DETECTED Final   Influenza A NOT DETECTED NOT DETECTED Final   Influenza B NOT DETECTED NOT DETECTED Final   Parainfluenza Virus 1 NOT DETECTED NOT DETECTED Final   Parainfluenza Virus 2 NOT DETECTED NOT DETECTED Final   Parainfluenza Virus 3 NOT DETECTED NOT DETECTED Final   Parainfluenza Virus 4 NOT DETECTED NOT DETECTED Final   Respiratory Syncytial Virus NOT DETECTED NOT DETECTED Final   Bordetella pertussis NOT DETECTED NOT DETECTED Final   Chlamydophila pneumoniae NOT DETECTED NOT DETECTED Final   Mycoplasma pneumoniae NOT DETECTED NOT DETECTED Final  MRSA PCR Screening     Status: None   Collection Time: 04/29/17 11:27 PM  Result  Value Ref Range Status   MRSA by PCR NEGATIVE NEGATIVE Final    Comment:        The GeneXpert MRSA Assay (FDA approved for NASAL specimens only), is one component of a comprehensive MRSA colonization surveillance program. It is not intended to diagnose MRSA infection nor to guide or monitor treatment for MRSA infections.      Studies: Ct Angio Chest Pe W Or Wo Contrast  Result Date: 04/30/2017 CLINICAL DATA:  Acute exacerbation of COPD, positive D-dimer. EXAM: CT ANGIOGRAPHY CHEST WITH CONTRAST TECHNIQUE: Multidetector CT imaging of the chest was performed using the standard protocol during bolus administration of  intravenous contrast. Multiplanar CT image reconstructions and MIPs were obtained to evaluate the vascular anatomy. CONTRAST:  100 cc Isovue 370 COMPARISON:  Chest CT dated 02/01/2017. FINDINGS: Cardiovascular: Some of the most peripheral segmental and subsegmental pulmonary artery branches are difficult to definitively characterize due to mild patient breathing motion artifact but there is no pulmonary embolism identified within the main, lobar or central segmental pulmonary arteries bilaterally. Thoracic aorta is normal in caliber. No aortic dissection. Heart size is normal. No pericardial effusion. Mediastinum/Nodes: No mass or enlarged lymph nodes within the mediastinum or perihilar regions. Esophagus appears normal. Trachea and central bronchi are unremarkable. Lungs/Pleura: Mosaic pattern within the upper lungs bilaterally suggesting air trapping. No evidence of pneumonia. No pleural effusion or pneumothorax seen. Upper Abdomen: Liver is low in density suggesting fatty infiltration. No acute findings. Musculoskeletal: No acute or suspicious osseous finding. Review of the MIP images confirms the above findings. IMPRESSION: 1. No pulmonary embolism seen, with mild study limitations detailed above. 2. Evidence of air trapping within the upper lungs bilaterally, compatible with the  given history of COPD. No evidence of pneumonia or pulmonary edema. 3. Probable fatty infiltration of the liver. Electronically Signed   By: Bary Richard M.D.   On: 04/30/2017 22:12   Scheduled Meds: . enoxaparin (LOVENOX) injection  40 mg Subcutaneous Daily  . gabapentin  600 mg Oral TID  . guaiFENesin  600 mg Oral BID  . insulin aspart  0-15 Units Subcutaneous TID WC  . insulin aspart  0-5 Units Subcutaneous QHS  . insulin aspart  8 Units Subcutaneous TID WC  . insulin glargine  25 Units Subcutaneous Daily  . mometasone-formoterol  2 puff Inhalation BID  . pantoprazole  40 mg Oral Daily  . predniSONE  5 mg Oral Q breakfast  . pregabalin  100 mg Oral QHS  . roflumilast  500 mcg Oral Daily  . sodium chloride flush  3 mL Intravenous Q12H  . sodium chloride flush  3 mL Intravenous Q12H  . tiotropium  18 mcg Inhalation Daily  . torsemide  20 mg Oral Daily   Continuous Infusions: . sodium chloride      Principal Problem:   Acute exacerbation of chronic obstructive pulmonary disease (COPD) (HCC) Active Problems:   OSA on BiPAP   Obesity hypoventilation syndrome (HCC)   Chronic diastolic heart failure (HCC)   Acute on chronic respiratory failure with hypoxia and hypercapnia (HCC)   Steroid-induced diabetes (HCC)   Leukocytosis   Positive D dimer  Time spent: 18 mins  Standley Dakins, MD, FAAFP [facility] Pager [phone]  If 7PM-7AM, please contact night-coverage [email or web address] Password [email or web address] 05/02/2017, 1:31 PM    LOS: 3 days

## 2017-05-02 NOTE — Discharge Summary (Addendum)
Physician Discharge Summary  [name]:[id number] DOB: May 20, 1976 DOA: 04/29/2017  PCP: Veryl Speak, FNP Pulmonologist; Rubye Oaks, Ramaswamy   Admit date: 04/29/2017 Discharge date: 05/03/2017  Admitted From: Home  Disposition:  Home   Recommendations for Outpatient Follow-up:  1. Follow up with PCP in 1 weeks 2. Follow up with pulmonology in 1 week.   Home Health: YES, PT  Equipment/Devices: oxygen, bipap  Discharge Condition: STABLE  CODE STATUS: FULL  Diet recommendation: Heart Healthy / Carb Modified   Brief/Interim Summary: HPI: Mary [name] is a 41 y.o. female with medical history significant for steroid dependent COPD with chronic 3 L/m supplemental oxygen requirement, chronic diastolic CHF, history of DVT, steroid-induced diabetes with neuropathy, obesity hypoventilation syndrome, and obstructive sleep apnea using BiPAP at home, now presenting to the emergency department with acute on chronic dyspnea. Patient had just been admitted to the hospital from 04/24/2017 until 04/27/2017 for exacerbation and COPD. She reports being in much improved and stable condition at time of discharge, but over the past couple days, has redeveloped severe dyspnea while at rest, as well as hypoxia in the low 80s on her usual 3 L/m of supplemental oxygen. She denies any significant chest pain and denies any new cough. There has not been any fevers or chills. She has been using her home therapies with very little relief. She called EMS for transport to the hospital and was noted to be saturating 75% on 4 L/m. She was treated with 5 mg albuterol neb prior to arrival.  ED Course: Upon arrival to the ED, patient is found to be afebrile, saturating 75% on 3 L/m, and with vitals otherwise stable. EKG features sinus rhythm with nonspecific T-wave abnormalities in the anterolateral leads. Chest x-ray is notable for a blunted left costodiaphragmatic angle, likely epicardial fat pad.  Chemistry panel is notable for a bicarbonate of 40, lower than priors, and a serum glucose of 179. CBC is notable for a leukocytosis to 14,700 and percent ptosis without anemia. Troponin is within the normal limits. Patient was placed on BiPAP in the ED, was treated with duo nebs, 125 mg IV Solu-Medrol, and 2 g of magnesium. [facility] and was consulted by the ED physician and evaluated the patient in the emergency department, finding her to be stable for SDU, and advising for a medical admission. Patient improved in the ED on BiPAP and has remained hemodynamically stable. She'll be admitted to the stepdown unit for ongoing evaluation and management of acute on chronic respiratory failure with hypoxia and hypercarbia, suspected secondary to acute exacerbation and COPD.  Hospital course: Mary [name] a 40 y.o.femalewith medical history significant forsteroid dependent COPD with chronic 3L/m supplemental oxygen requirement, chronic diastolic CHF, history of DVT, steroid-induced diabetes with neuropathy, obesity hypoventilation syndrome, and obstructive sleep apnea using BiPAP at home, now presenting to the emergency department with acute on chronic dyspnea.  Assessment & Plan:   1. COPD with acute exacerbation; acute on chronic hypoxic/hypercarbic respiratory failure  - Pt has severe COPD with steroid-dependency, using 3 Lpm and nocturnal BiPAP at home, now presenting with acute on chronic SOB and increased O2-requirement, satting only 75% on her usual 3 Lpm  - CXR appears clear, ?of effusion, likely epicardial fat pad, will follow-up with 2-view - ABG did not crossover, notable for pH 7.32 and pCO2 >100  - PCCM was consulted and following - Continue BiPAP prn and qHS, continue Spiriva, Daliresp, Symbicort, and prednisone 5 mg qD  - Minimize  narcotic and sedating-agents  - Markedly elevated D dimer,  CTA chest PE protocol negative, no pneumonia seen.   2. Chronic diastolic CHF  stable -  Difficult to determine fluid status d/t body-habitus, appears euvolemic  - TTE (02/01/17) with EF 65-70%, grade 1 diastolic dysfunction, mild LAE, and mild TR  - Managed at home with torsemide, will continue  - SLIV, follow daily wts and I/O's   3. Insulin-dependent DM type 2 - A1c was 6.8% earlier this month  - Managed at home with Lantus 25 units qD,  - Check CBG with meals and qHS - Continue Lantus 25 units qD, Novolog 10 units with meals, and per moderate-intensity SSI   CBG (last 3)   Recent Labs (last 2 labs)    Recent Labs  05/01/17 2207 05/02/17 0738 05/02/17 1206  GLUCAP 318* 188* 243*     4. OSA  - Using BiPAP qHS at home, will continue with home settings, pulmonology following, follow up with pulmonology outpatient  5. Leukocytosis  - WBC is 14,700 on admission - Possibly secondary to steroids; CXR clear, no other localizing sxs  - Culture if febrile, repeat CBC with diff in am   6. Severe diabetic peripheral neuropathy - resume home gabapentin, DC lyrica   DVT prophylaxis:sq Lovenox  Code Status:Full  Family Communication:Discussed with patient Disposition Plan:Home  Consults called:PCCM  Admission status:Inpatient   Consultants: PCCM   Discharge Diagnoses:  Principal Problem:   Acute exacerbation of chronic obstructive pulmonary disease (COPD) (HCC) Active Problems:   OSA on BiPAP   Obesity hypoventilation syndrome (HCC)   Chronic diastolic heart failure (HCC)   Acute on chronic respiratory failure with hypoxia and hypercapnia (HCC)   Steroid-induced diabetes (HCC)   Leukocytosis   Positive D dimer  Discharge Instructions  Discharge Instructions    Increase activity slowly    Complete by:  As directed      Allergies as of 05/02/2017   No Known Allergies     Medication List    STOP taking these medications   acetaminophen-codeine 300-30 MG tablet Commonly known as:  TYLENOL #3   HYDROcodone-acetaminophen 5-325 MG  tablet Commonly known as:  NORCO/VICODIN   pregabalin 50 MG capsule Commonly known as:  LYRICA   traMADol 50 MG tablet Commonly known as:  ULTRAM     TAKE these medications   albuterol 108 (90 Base) MCG/ACT inhaler Commonly known as:  PROVENTIL HFA;VENTOLIN HFA Inhale 2 puffs into the lungs every 6 (six) hours as needed for wheezing or shortness of breath.   budesonide-formoterol 160-4.5 MCG/ACT inhaler Commonly known as:  SYMBICORT Inhale 2 puffs into the lungs 2 (two) times daily.   gabapentin 300 MG capsule Commonly known as:  NEURONTIN TAKE 2 CAPSULES BY MOUTH 3 TIMES DAILY.   guaiFENesin 600 MG 12 hr tablet Commonly known as:  MUCINEX Take 1 tablet (600 mg total) by mouth 2 (two) times daily.   insulin glargine 100 UNIT/ML injection Commonly known as:  LANTUS Inject 0.25 mLs (25 Units total) into the skin daily.   insulin lispro 100 UNIT/ML injection Commonly known as:  HUMALOG Inject 0-0.15 mLs (0-15 Units total) into the skin 3 (three) times daily with meals. CBG < 70: Eat or drink something right away and recheck. CBG 70 - 120: 0 units CBG 121 - 150: 2 units CBG 151 - 200: 3 units CBG 201 - 250: 5 units CBG 251 - 300: 8 units CBG 301 - 350: 11 units CBG 351 -  400: 15 units CBG > 400: call MD.   pantoprazole 40 MG tablet Commonly known as:  PROTONIX Take 1 tablet (40 mg total) by mouth 2 (two) times daily before a meal.   potassium chloride SA 20 MEQ tablet Commonly known as:  K-DUR,KLOR-CON Take 1 tablet (20 mEq total) by mouth daily.   predniSONE 5 MG tablet Commonly known as:  DELTASONE Resume 5 mg daily after you have completed the 10mg  tab taper What changed:  how much to take  how to take this  when to take this  additional instructions   roflumilast 500 MCG Tabs tablet Commonly known as:  DALIRESP Take 500 mcg by mouth daily.   Tiotropium Bromide Monohydrate 1.25 MCG/ACT Aers Commonly known as:  SPIRIVA RESPIMAT Inhale 2 puffs into the lungs  daily.   torsemide 20 MG tablet Commonly known as:  DEMADEX Take 1 tablet (20 mg total) by mouth daily.      Follow-up Information    Veryl Speak, FNP. Schedule an appointment as soon as possible for a visit in 1 week(s).   Specialty:  Family Medicine Contact information: [address] [place] [address] [phone]        Julio Sicks, NP. Schedule an appointment as soon as possible for a visit in 1 week(s).   Specialty:  Pulmonary Disease Contact information: [address] [place] [address] [phone]        Kalman Shan, MD. Schedule an appointment as soon as possible for a visit in 1 month(s).   Specialty:  Pulmonary Disease Contact information: [address] [place] [address] [phone]          No Known Allergies  Procedures/Studies: Dg Chest 2 View  Result Date: 04/30/2017 CLINICAL DATA:  Acute respiratory failure. EXAM: CHEST  2 VIEW COMPARISON:  Radiographs of Apr 29, 2017. CT scan of February 01, 2017. FINDINGS: Stable cardiomediastinal silhouette and central pulmonary vascular congestion. No pneumothorax is noted. No significant pleural effusion is noted. Right lung is clear. Mild left basilar atelectasis is noted. Bony thorax is unremarkable. IMPRESSION: Mild left basilar subsegmental atelectasis. Stable central pulmonary vascular congestion. Electronically Signed   By: Lupita Raider, M.D.   On: 04/30/2017 12:37   Dg Ankle Complete Left  Result Date: 04/20/2017 CLINICAL DATA:  Fall with ankle swelling EXAM: LEFT ANKLE COMPLETE - 3+ VIEW COMPARISON:  None. FINDINGS: There is no evidence of fracture, dislocation, or joint effusion. There is no evidence of arthropathy or other focal bone abnormality. Moderate soft tissue swelling circumferentially. IMPRESSION: Circumferential left ankle soft tissue swelling without fracture or dislocation. Electronically Signed   By: Deatra Robinson M.D.   On: 04/20/2017 00:46   Ct Angio  Chest Pe W Or Wo Contrast  Result Date: 04/30/2017 CLINICAL DATA:  Acute exacerbation of COPD, positive D-dimer. EXAM: CT ANGIOGRAPHY CHEST WITH CONTRAST TECHNIQUE: Multidetector CT imaging of the chest was performed using the standard protocol during bolus administration of intravenous contrast. Multiplanar CT image reconstructions and MIPs were obtained to evaluate the vascular anatomy. CONTRAST:  100 cc Isovue 370 COMPARISON:  Chest CT dated 02/01/2017. FINDINGS: Cardiovascular: Some of the most peripheral segmental and subsegmental pulmonary artery branches are difficult to definitively characterize due to mild patient breathing motion artifact but there is no pulmonary embolism identified within the main, lobar or central segmental pulmonary arteries bilaterally. Thoracic aorta is normal in caliber. No aortic dissection. Heart size is normal. No pericardial effusion. Mediastinum/Nodes: No mass  or enlarged lymph nodes within the mediastinum or perihilar regions. Esophagus appears normal. Trachea and central bronchi are unremarkable. Lungs/Pleura: Mosaic pattern within the upper lungs bilaterally suggesting air trapping. No evidence of pneumonia. No pleural effusion or pneumothorax seen. Upper Abdomen: Liver is low in density suggesting fatty infiltration. No acute findings. Musculoskeletal: No acute or suspicious osseous finding. Review of the MIP images confirms the above findings. IMPRESSION: 1. No pulmonary embolism seen, with mild study limitations detailed above. 2. Evidence of air trapping within the upper lungs bilaterally, compatible with the given history of COPD. No evidence of pneumonia or pulmonary edema. 3. Probable fatty infiltration of the liver. Electronically Signed   By: Bary Richard M.D.   On: 04/30/2017 22:12   Dg Chest Port 1 View  Result Date: 04/29/2017 CLINICAL DATA:  42 y/o  F; shortness of breath and weakness. EXAM: PORTABLE CHEST 1 VIEW COMPARISON:  04/24/2017 chest radiograph.  FINDINGS: Stable cardiac silhouette given projection and technique. Blunted left costal diaphragmatic angle, probably prominent epicardial fat pad. Consider lateral radiographs to exclude small effusion. Otherwise clear lungs. Bones are unremarkable. IMPRESSION: Blunted left costal diaphragmatic angle, probably prominent epicardial fat pad. Consider lateral radiographs to exclude small effusion. Otherwise clear lungs. Electronically Signed   By: Mitzi Hansen M.D.   On: 04/29/2017 19:29   Dg Chest Portable 1 View  Result Date: 04/24/2017 CLINICAL DATA:  Pt arrives from home via [facility] reporting SOB, increased lethargy, "feeling off" since fall on Tuesday. Denies chest pain and cough. Hx of COPD, HTN, pneumonia. Patient shielded. EXAM: PORTABLE CHEST - 1 VIEW COMPARISON:  04/20/2017 FINDINGS: Lungs are clear. Resolution of the perihilar opacities described previously. Heart size and mediastinal contours are within normal limits for technique and positioning. No definite effusion. Visualized bones unremarkable. IMPRESSION: No acute findings Electronically Signed   By: Corlis Leak M.D.   On: 04/24/2017 10:25   Dg Chest Portable 1 View  Result Date: 04/20/2017 CLINICAL DATA:  Shortness of breath. EXAM: PORTABLE CHEST 1 VIEW COMPARISON:  Radiograph 03/24/2017.  Chest CT 02/01/2017 FINDINGS: Unchanged heart size and mediastinal contours. The lungs are hyperinflated. There is increased peribronchial thickening from prior. Streaky bibasilar opacities, left lung base not well visualized due to overlapping soft tissue structures. No large pleural effusion or pneumothorax. Grossly stable osseous structures. IMPRESSION: Increased peribronchial thickening, may be infectious or inflammatory, less likely pulmonary edema. Streaky bibasilar opacities. The left lung base is only assessed due to overlapping soft tissue structures. PA and lateral views may be helpful when patient is able. Electronically Signed   By:  Rubye Oaks M.D.   On: 04/20/2017 00:33    (Echo, Carotid, EGD, Colonoscopy, ERCP)    Subjective: Pt without complaints.   Discharge Exam: Vitals:   05/01/17 2026 05/02/17 0652  BP: 127/90 127/77  Pulse: 91 74  Resp: 18 18  Temp: 98.2 F (36.8 C) 97.8 F (36.6 C)   Vitals:   05/01/17 2026 05/01/17 2137 05/02/17 0652 05/02/17 0909  BP: 127/90  127/77   Pulse: 91  74   Resp: 18  18   Temp: 98.2 F (36.8 C)  97.8 F (36.6 C)   TempSrc: Oral  Oral   SpO2: 94% 95% 100% 95%  Weight:   91.8 kg (202 lb 4.8 oz)   Height:        General exam: awake, alert, NAD.  Respiratory system: shallow BS bilateral. No increased work of breathing. Cardiovascular system: S1 & S2 heard, RRR. No  JVD, murmurs, gallops, clicks or pedal edema. Gastrointestinal system: Abdomen is nondistended, soft and nontender. Normal bowel sounds heard. Central nervous system: Alert and oriented. No focal neurological deficits. Extremities: no cyanosis.  The results of significant diagnostics from this hospitalization (including imaging, microbiology, ancillary and laboratory) are listed below for reference.     Microbiology: Recent Results (from the past 240 hour(s))  Respiratory Panel by PCR     Status: None   Collection Time: 04/24/17  8:41 PM  Result Value Ref Range Status   Adenovirus NOT DETECTED NOT DETECTED Final   Coronavirus 229E NOT DETECTED NOT DETECTED Final   Coronavirus HKU1 NOT DETECTED NOT DETECTED Final   Coronavirus NL63 NOT DETECTED NOT DETECTED Final   Coronavirus OC43 NOT DETECTED NOT DETECTED Final   Metapneumovirus NOT DETECTED NOT DETECTED Final   Rhinovirus / Enterovirus NOT DETECTED NOT DETECTED Final   Influenza A NOT DETECTED NOT DETECTED Final   Influenza B NOT DETECTED NOT DETECTED Final   Parainfluenza Virus 1 NOT DETECTED NOT DETECTED Final   Parainfluenza Virus 2 NOT DETECTED NOT DETECTED Final   Parainfluenza Virus 3 NOT DETECTED NOT DETECTED Final    Parainfluenza Virus 4 NOT DETECTED NOT DETECTED Final   Respiratory Syncytial Virus NOT DETECTED NOT DETECTED Final   Bordetella pertussis NOT DETECTED NOT DETECTED Final   Chlamydophila pneumoniae NOT DETECTED NOT DETECTED Final   Mycoplasma pneumoniae NOT DETECTED NOT DETECTED Final  MRSA PCR Screening     Status: None   Collection Time: 04/29/17 11:27 PM  Result Value Ref Range Status   MRSA by PCR NEGATIVE NEGATIVE Final    Comment:        The GeneXpert MRSA Assay (FDA approved for NASAL specimens only), is one component of a comprehensive MRSA colonization surveillance program. It is not intended to diagnose MRSA infection nor to guide or monitor treatment for MRSA infections.      Labs: BNP (last 3 results)  Recent Labs  02/09/17 0340 04/24/17 0950 04/29/17 1900  BNP 104.2* 234.4* 537.0*   Basic Metabolic Panel:  Recent Labs Lab 04/26/17 0740 04/29/17 1900 04/30/17 0247 04/30/17 1002  NA 136 136 135 135  K 4.5 5.0 4.7 4.7  CL 85* 84* 85* 86*  CO2 42* 40* 39* 38*  GLUCOSE 216* 179* 286* 256*  BUN 11 22* 16 12  CREATININE 0.53 0.83 0.76 0.66  CALCIUM 9.1 9.2 9.0 9.5   Liver Function Tests:  Recent Labs Lab 04/29/17 1900 04/30/17 1002  AST 84* 46*  ALT 141* 104*  ALKPHOS 113 100  BILITOT 1.4* 1.2  PROT 6.9 6.8  ALBUMIN 4.1 3.9   No results for input(s): LIPASE, AMYLASE in the last 168 hours. No results for input(s): AMMONIA in the last 168 hours. CBC:  Recent Labs Lab 04/29/17 1900 04/30/17 0247 05/01/17 0236  WBC 14.7* 13.3* 12.4*  NEUTROABS 11.2* 12.6* 9.3*  HGB 15.0 14.1 12.9  HCT 49.9* 47.8* 42.8  MCV 105.5* 106.2* 103.6*  PLT 182 160 155   Cardiac Enzymes: No results for input(s): CKTOTAL, CKMB, CKMBINDEX, TROPONINI in the last 168 hours. BNP: Invalid input(s): POCBNP CBG:  Recent Labs Lab 05/01/17 1634 05/01/17 1635 05/01/17 2207 05/02/17 0738 05/02/17 1206  GLUCAP 78 78 318* 188* 243*   D-Dimer  Recent Labs   04/29/17 2328  DDIMER 2.86*   Hgb A1c No results for input(s): HGBA1C in the last 72 hours. Lipid Profile No results for input(s): CHOL, HDL, LDLCALC, TRIG, CHOLHDL, LDLDIRECT in  the last 72 hours. Thyroid function studies No results for input(s): TSH, T4TOTAL, T3FREE, THYROIDAB in the last 72 hours.  Invalid input(s): FREET3 Anemia work up No results for input(s): VITAMINB12, FOLATE, FERRITIN, TIBC, IRON, RETICCTPCT in the last 72 hours. Urinalysis    Component Value Date/Time   COLORURINE YELLOW 04/24/2017 1815   APPEARANCEUR HAZY (A) 04/24/2017 1815   LABSPEC 1.012 04/24/2017 1815   PHURINE 5.0 04/24/2017 1815   GLUCOSEU >=500 (A) 04/24/2017 1815   HGBUR NEGATIVE 04/24/2017 1815   BILIRUBINUR NEGATIVE 04/24/2017 1815   KETONESUR 5 (A) 04/24/2017 1815   PROTEINUR NEGATIVE 04/24/2017 1815   UROBILINOGEN 1.0 07/25/2015 1400   NITRITE NEGATIVE 04/24/2017 1815   LEUKOCYTESUR NEGATIVE 04/24/2017 1815   Sepsis Labs Invalid input(s): PROCALCITONIN,  WBC,  LACTICIDVEN Microbiology Recent Results (from the past 240 hour(s))  Respiratory Panel by PCR     Status: None   Collection Time: 04/24/17  8:41 PM  Result Value Ref Range Status   Adenovirus NOT DETECTED NOT DETECTED Final   Coronavirus 229E NOT DETECTED NOT DETECTED Final   Coronavirus HKU1 NOT DETECTED NOT DETECTED Final   Coronavirus NL63 NOT DETECTED NOT DETECTED Final   Coronavirus OC43 NOT DETECTED NOT DETECTED Final   Metapneumovirus NOT DETECTED NOT DETECTED Final   Rhinovirus / Enterovirus NOT DETECTED NOT DETECTED Final   Influenza A NOT DETECTED NOT DETECTED Final   Influenza B NOT DETECTED NOT DETECTED Final   Parainfluenza Virus 1 NOT DETECTED NOT DETECTED Final   Parainfluenza Virus 2 NOT DETECTED NOT DETECTED Final   Parainfluenza Virus 3 NOT DETECTED NOT DETECTED Final   Parainfluenza Virus 4 NOT DETECTED NOT DETECTED Final   Respiratory Syncytial Virus NOT DETECTED NOT DETECTED Final   Bordetella  pertussis NOT DETECTED NOT DETECTED Final   Chlamydophila pneumoniae NOT DETECTED NOT DETECTED Final   Mycoplasma pneumoniae NOT DETECTED NOT DETECTED Final  MRSA PCR Screening     Status: None   Collection Time: 04/29/17 11:27 PM  Result Value Ref Range Status   MRSA by PCR NEGATIVE NEGATIVE Final    Comment:        The GeneXpert MRSA Assay (FDA approved for NASAL specimens only), is one component of a comprehensive MRSA colonization surveillance program. It is not intended to diagnose MRSA infection nor to guide or monitor treatment for MRSA infections.    Time coordinating discharge: 31 minutes  SIGNED:  Standley Dakins, MD  [facility] 05/02/2017, 1:46 PM Pager [phone]  If 7PM-7AM, please contact night-coverage [email or web address] Password [email or web address]

## 2017-05-02 NOTE — Progress Notes (Signed)
Pt places self on/off cpap. RT will monitor. 

## 2017-05-02 NOTE — Care Management Note (Addendum)
Case Management Note  Patient Details  Name: Mary [name] MRN: [id number] Date of Birth: 10/21/1976  Subjective/Objective:           CM following for progression and d/c planning.          Action/Plan: Noted plan to d/c pt today, 05/02/2017 however this pt required changes in BiPap setting to 18/10 last pm with 6 liter bleed in. This CM spoke with Dr [name] re orders for this at home and he would like for pulmonary to determine if these settings are appropriate for this pt. Additionally the pt would [facility] these changes made after d/c and that office [facility] open [facility] Therefore this pt will d/c to home tomorrow. Await pulmonary input and for pt to be able to go to the medical supply store in order for her BiPap to be adjusted.  [facility].  Expected Discharge Date:  05/03/17               Expected Discharge Plan:  Home w Home Health Services  In-House Referral:  NA  Discharge planning Services  CM Consult  Post Acute Care Choice:  Home Health Choice offered to:  Patient  DME Arranged:    DME Agency:     HH Arranged:  PT [facility]  Status of Service:  Completed, signed off  If discussed at [employer] Length of Stay Meetings, dates discussed:    Additional Comments:  Starlyn SkeansRoyal, Gopal Malter U, RN 05/02/2017, 2:18 PM

## 2017-05-02 NOTE — Progress Notes (Signed)
Name: Jana HakimMichelle L Prieur MRN: [id number] DOB: 10/25/1976    ADMISSION DATE:  04/29/2017 CONSULTATION DATE:  04/29/2017  REFERRING MD :  Dr. Silverio LayYao  CHIEF COMPLAINT:  Somnolence  HISTORY OF PRESENT ILLNESS:   [age] CF with neuropathy (likely diabetic and long term previous EtOH use), DM, Pulmonary hypertension (WHO III) and very severe COPD (on 3L home O2) (followed by Dr. Marchelle Gearingamaswamy).  [facility].  She was home for two days and started on tylenol #3 by her PCP in addition to her neuropathy regimen (Lyrica, Neurontin, tramadol).  She states that she was extremely somnolent while at home.  She denied any SOB, CP, DOE, n/v/d, fevers, cough, LE edema.    She states she took 3 tylenol #3 each day for the last two days in addition to the above regimen.  She denies other sedating medications as well as EtOH.  She states today she slept most of the day and did not get up until the afternoon of admit  when her boyfriend came home (even though she slept all night and all day yesterday).  She states she has also been having muscle jerks that are new for her.  She did say that she has been using her home Bipap at night and with all daytime naps.  Her new settings after her recent hospitalization are BiPAP 16/5.  SUBJECTIVE:  Very unhappy with her bipap, multiple questions re longterm options for sleep but says breathing a lot better now    VITAL SIGNS: Temp:  [97.2 F (36.2 C)-98.2 F (36.8 C)] 97.2 F (36.2 C) (05/28 1500) Pulse Rate:  [74-91] 85 (05/28 1500) Resp:  [18] 18 (05/28 1500) BP: (120-127)/(71-90) 120/71 (05/28 1500) SpO2:  [94 %-100 %] 98 % (05/28 1500) Weight:  [91.8 kg (202 lb 4.8 oz)] 91.8 kg (202 lb 4.8 oz) (05/28 0652)  FIO2  2.5 lpm   PHYSICAL EXAMINATION: General:  AAOx3, NAD/ hopeless helpless affect  Neuro:   Alert/ approp/ no def  HEENT:  Oropharynx clear - Modified Mallampati Score =   3/4  Cardiovascular:   Distant Heart  sounds Lungs: with distant bs, no wheezes Abdomen: very obese/ soft,  , normal bowel sounds, no TTP or guarding Musculoskeletal:  TTP LLE, normal bulk and tone Skin:  No rash .    Recent Labs Lab 04/29/17 1900 04/30/17 0247 04/30/17 1002  NA 136 135 135  K 5.0 4.7 4.7  CL 84* 85* 86*  CO2 40* 39* 38*  BUN 22* 16 12  CREATININE 0.83 0.76 0.66  GLUCOSE 179* 286* 256*    Recent Labs Lab 04/29/17 1900 04/30/17 0247 05/01/17 0236  HGB 15.0 14.1 12.9  HCT 49.9* 47.8* 42.8  WBC 14.7* 13.3* 12.4*  PLT 182 160 155   Ct Angio Chest Pe W Or Wo Contrast  Result Date: 04/30/2017 CLINICAL DATA:  Acute exacerbation of COPD, positive D-dimer. EXAM: CT ANGIOGRAPHY CHEST WITH CONTRAST TECHNIQUE: Multidetector CT imaging of the chest was performed using the standard protocol during bolus administration of intravenous contrast. Multiplanar CT image reconstructions and MIPs were obtained to evaluate the vascular anatomy. CONTRAST:  100 cc Isovue 370 COMPARISON:  Chest CT dated 02/01/2017. FINDINGS: Cardiovascular: Some of the most peripheral segmental and subsegmental pulmonary artery branches are difficult to definitively characterize due to mild patient breathing motion artifact but there is no pulmonary embolism identified within the main, lobar or central segmental pulmonary arteries bilaterally. Thoracic aorta is normal in caliber.  No aortic dissection. Heart size is normal. No pericardial effusion. Mediastinum/Nodes: No mass or enlarged lymph nodes within the mediastinum or perihilar regions. Esophagus appears normal. Trachea and central bronchi are unremarkable. Lungs/Pleura: Mosaic pattern within the upper lungs bilaterally suggesting air trapping. No evidence of pneumonia. No pleural effusion or pneumothorax seen. Upper Abdomen: Liver is low in density suggesting fatty infiltration. No acute findings. Musculoskeletal: No acute or suspicious osseous finding. Review of the MIP images confirms the  above findings. IMPRESSION: 1. No pulmonary embolism seen, with mild study limitations detailed above. 2. Evidence of air trapping within the upper lungs bilaterally, compatible with the given history of COPD. No evidence of pneumonia or pulmonary edema. 3. Probable fatty infiltration of the liver. Electronically Signed   By: Bary Richard M.D.   On: 04/30/2017 22:12    ASSESSMENT / PLAN:  41 yo CF with OSA, OHS, very severe COPD p/w somnolence which is likely due to the addition of another sedating agent (new narcotic) in the setting of her already extensive regimen with CNS depressants and concurrent COPD with chronic CO2 retention and ? bipap set up optimally.   It is likely that even the slightest respiratory depressive effect produced a worsening hypercapnia which can worsen sedation and produce a self perpetuating cycle.  This hypercapnia would also explain her neurologic findings representing hypercapnic narcosis.  COPD appears stable without flare   Her pulmonary hypertension is well compensated and she is euvolemic at this time, not a candidate for pulmonary vasodilators at present time, can revisit as outpatient, though data for the use of this in patients with WHO III are not good. She was seen by [facility] services and is not a candidate for transplantation. She is still smoking. Smoking cessation discussed  She has had Alpha-1 Antitrypsin testing and is Genotype PiMM with normal serum levels.    Recommend: - continue home spiriva, roflumilast, symbicort and prednisone 5mg  Qday - Recommend d/c or at least  dose of narcotics - Recommend consideration of a less sedating neuropathy regimen - Continue BiPAP  From home and let RT troubleshoot the problem while she's still inpt . Cont w/ follow up with pulmonary in OP setting .  - Dimer positive>  CT chest PE neg 5/26/ L venous dopplers neg 5/27  - desperately needs better daytime mobilization    Will see if one of our sleep  docs can sort out noct needs prior to d/c this time       Sandrea Hughs, MD Pulmonary and Critical Care Medicine [facility] Cell [phone] After 5:30 PM or weekends, use Beeper [phone]

## 2017-05-03 DIAGNOSIS — J9600 Acute respiratory failure, unspecified whether with hypoxia or hypercapnia: Secondary | ICD-10-CM

## 2017-05-03 LAB — GLUCOSE, CAPILLARY: Glucose-Capillary: 134 mg/dL — ABNORMAL HIGH (ref 65–99)

## 2017-05-03 NOTE — Progress Notes (Signed)
Pt has orders to be discharged. Discharge instructions given and pt has no additional questions at this time. Medication regimen reviewed and pt educated. Pt verbalized understanding and has no additional questions. Telemetry box removed. IV removed and site in good condition. Pt stable and assisted to husband's truck with home oxygen tank.

## 2017-05-03 NOTE — Telephone Encounter (Signed)
Called and spoke with pts significant other---he stated that she is in the hospital but will be discharged today.  Will call back later.

## 2017-05-03 NOTE — Care Management Note (Addendum)
Case Management Note  Patient Details  Name: Mary [name] MRN: [id number] Date of Birth: 12/01/1976  Subjective/Objective:           CM following for progression and d/c planning.          Action/Plan: Noted plan to d/c pt today, 05/02/2017 however this pt required changes in BiPap setting to 18/10 last pm with 6 liter bleed in. This CM spoke with Dr [name] re orders for this at home and he would like for pulmonary to determine if these settings are appropriate for this pt. Additionally the pt would [facility] these changes made after d/c and that office is not open today due [facility]. Therefore this pt will d/c to home tomorrow. Await pulmonary input and for pt to be able to go to the medical supply store in order for her BiPap to be adjusted.  [facility].  Expected Discharge Date:  05/03/17               Expected Discharge Plan:  Home w Home Health Services  In-House Referral:  NA  Discharge planning Services  CM Consult  Post Acute Care Choice:  Home Health Choice offered to:  Patient  DME Arranged:    DME Agency:     HH Arranged:  PT, OT HH [facility]  Status of Service:  Completed, signed off  If discussed at Long Length of Stay Meetings, dates discussed:    Additional Comments [facility] confirmed that BIPAP settings can be changed via computer regardless of equipment location.  Brad with [facility] confirmed with CM that setting would be changed today per todays order - pt doesn't have to remain in [facility] settings.  Pt is on home oxygen 2 liters in the home - [facility] and pt has portable tank for transport home.  Pt is independent with ambulation   [facility] informed of discharge home today and that HHPT has now been added.  CM spoke with attending  - new BIPAP orders are now entered into  Epic per respiratory recommendations.  CM contacted [facility] and requested BIPAP settings to be changed ASAP while inpt with discharge already written  Cherylann ParrClaxton, Peterson Mathey S, RN 05/03/2017, 8:38 AM

## 2017-05-03 NOTE — Progress Notes (Signed)
05/03/2017 8:56 AM  Pt was seen and examined.  New bipap settings ordered. Pt stable to discharge home.  Maryln Manuel. Johnson MD

## 2017-05-04 ENCOUNTER — Ambulatory Visit: Payer: 59 | Attending: Nephrology | Admitting: Neurology

## 2017-05-04 DIAGNOSIS — Z79899 Other long term (current) drug therapy: Secondary | ICD-10-CM | POA: Insufficient documentation

## 2017-05-04 DIAGNOSIS — G4730 Sleep apnea, unspecified: Secondary | ICD-10-CM | POA: Insufficient documentation

## 2017-05-04 DIAGNOSIS — G4736 Sleep related hypoventilation in conditions classified elsewhere: Secondary | ICD-10-CM | POA: Insufficient documentation

## 2017-05-04 DIAGNOSIS — G4733 Obstructive sleep apnea (adult) (pediatric): Secondary | ICD-10-CM

## 2017-05-04 DIAGNOSIS — Z794 Long term (current) use of insulin: Secondary | ICD-10-CM | POA: Diagnosis not present

## 2017-05-04 NOTE — Telephone Encounter (Signed)
ATC, NA and VM not set up yet

## 2017-05-05 ENCOUNTER — Telehealth: Payer: Self-pay | Admitting: Family

## 2017-05-05 NOTE — Telephone Encounter (Signed)
atc pt x1 vm has not been set up yet

## 2017-05-05 NOTE — Telephone Encounter (Signed)
FYI:  PT is going out tomorrow to start home health services.  Would like a call back with OK.

## 2017-05-05 NOTE — Telephone Encounter (Signed)
Called back and left message for verbal ok for PT per Tammy SoursGreg

## 2017-05-05 NOTE — Telephone Encounter (Signed)
Pt returning call.Stanley A Dalton ° °

## 2017-05-06 ENCOUNTER — Ambulatory Visit (INDEPENDENT_AMBULATORY_CARE_PROVIDER_SITE_OTHER): Payer: 59 | Admitting: Pulmonary Disease

## 2017-05-06 ENCOUNTER — Encounter: Payer: Self-pay | Admitting: Family

## 2017-05-06 ENCOUNTER — Encounter: Payer: Self-pay | Admitting: Pulmonary Disease

## 2017-05-06 ENCOUNTER — Ambulatory Visit (INDEPENDENT_AMBULATORY_CARE_PROVIDER_SITE_OTHER): Payer: 59 | Admitting: Family

## 2017-05-06 VITALS — HR 96 | Resp 18 | Ht 62.0 in | Wt 203.0 lb

## 2017-05-06 VITALS — BP 118/70 | HR 98 | Ht 62.0 in | Wt 203.0 lb

## 2017-05-06 DIAGNOSIS — G4733 Obstructive sleep apnea (adult) (pediatric): Secondary | ICD-10-CM

## 2017-05-06 DIAGNOSIS — J9611 Chronic respiratory failure with hypoxia: Secondary | ICD-10-CM

## 2017-05-06 DIAGNOSIS — J449 Chronic obstructive pulmonary disease, unspecified: Secondary | ICD-10-CM

## 2017-05-06 DIAGNOSIS — J9612 Chronic respiratory failure with hypercapnia: Secondary | ICD-10-CM

## 2017-05-06 MED ORDER — TRAMADOL HCL 50 MG PO TABS: 50.0000 mg | ORAL_TABLET | Freq: Two times a day (BID) | ORAL | 0 refills | Status: DC | PRN

## 2017-05-06 MED ORDER — PREGABALIN 50 MG PO CAPS: 50.0000 mg | ORAL_CAPSULE | Freq: Three times a day (TID) | ORAL | 0 refills | Status: DC

## 2017-05-06 NOTE — Progress Notes (Signed)
Subjective:    Patient ID: Mary [name], female    DOB: 08-06-1976, 41 y.o.   MRN: [id number]  Chief Complaint  Patient presents with  . Hospitalization Follow-up    states that lyrica and tramadol are supposed to be on med list    HPI:  Mary [name] is a 41 y.o. female who  has a past medical history of Axillary adenopathy; Chronic right-sided CHF (congestive heart failure) (HCC) (03/23/2012); COPD (chronic obstructive pulmonary disease) (HCC); DVT of upper extremity (deep vein thrombosis) [facility]) (April 2013); Exertional shortness of breath; Hepatic cirrhosis (HCC); History of alcoholism (HCC); History of chronic bronchitis; Irregular menses; Migraine; Moderate to severe pulmonary hypertension [facility]) (April 2013); On home oxygen therapy; OSA (obstructive sleep apnea); Periodontal disease; Pneumonia (~ 1985; 01/2011); Pulmonary embolus [facility]) (April 2013); and Pulmonary hypertension (HCC). and presents today for an acute office visit.    Recently evaluated in the emergency department and admitted to the hospital for COPD exacerbation with worsening shortness of breath and trouble breathing. She was noted to have 75% oxygen saturation on 4 L nasal cannula when being triaged. Physical exam with tachypnea and diminished breath sounds throughout with mild wheezing. No calf tenderness or edema. EKG showed sinus rhythm and borderline right axis deviation. ABG showed a pH of 7.32 and CO2 greater than 95. She was started on BiPAP and admitted to stepdown. Chest x-ray was clear with questionable amount of effusion. She was continued on BiPAP as needed and at bedtime with continued Spurrier, Daliresp, Symbicort, and prednisone. Recommended minimizing narcotic and sedating agents. She had markedly elevated d-dimer with the CTA of the chest PE protocol being negative. A1c of 6.8 earlier in the month and managed with Lantus. Found to have a white blood cell count of 14.7. She was complaining of severe  diabetic neuropathy with resumed home gabapentin and discontinued Lyrica. All hospital records, labs, imaging reviewed in detail.  Since leaving the hospital she reports feeling tired all the time, but has been improved. She continues to take her medications as prescribed and denies adverse side effects. She followed up with pulmonology today with the recommendation for 4 L of oxygen blended with a current bilevel setting of 20/10. Does continue to experience neuropathy and is adequately controlled with gabapentin and Lyrica. Notes that the Tramadol does not make her sleepy or drowsy.  Believes it was related to the Tylenol with codeine. Notes she has not had a cigarette since her last office visit on 04/21/17. Notes that she remains on 3 L via nasal cannula.   No Known Allergies    Outpatient Medications Prior to Visit  Medication Sig Dispense Refill  . albuterol (PROVENTIL HFA;VENTOLIN HFA) 108 (90 BASE) MCG/ACT inhaler Inhale 2 puffs into the lungs every 6 (six) hours as needed for wheezing or shortness of breath. 8.5 g 1  . budesonide-formoterol (SYMBICORT) 160-4.5 MCG/ACT inhaler Inhale 2 puffs into the lungs 2 (two) times daily. 1 Inhaler 12  . gabapentin (NEURONTIN) 300 MG capsule TAKE 2 CAPSULES BY MOUTH 3 TIMES DAILY. 180 capsule 2  . guaiFENesin (MUCINEX) 600 MG 12 hr tablet Take 1 tablet (600 mg total) by mouth 2 (two) times daily. 40 tablet 0  . insulin glargine (LANTUS) 100 UNIT/ML injection Inject 0.25 mLs (25 Units total) into the skin daily.    . insulin lispro (HUMALOG) 100 UNIT/ML injection Inject 0-0.15 mLs (0-15 Units total) into the skin 3 (three) times daily with meals. CBG < 70: Eat or drink  something right away and recheck. CBG 70 - 120: 0 units CBG 121 - 150: 2 units CBG 151 - 200: 3 units CBG 201 - 250: 5 units CBG 251 - 300: 8 units CBG 301 - 350: 11 units CBG 351 - 400: 15 units CBG > 400: call MD.    . pantoprazole (PROTONIX) 40 MG tablet Take 1 tablet (40 mg  total) by mouth 2 (two) times daily before a meal. 60 tablet 0  . potassium chloride SA (K-DUR,KLOR-CON) 20 MEQ tablet Take 1 tablet (20 mEq total) by mouth daily. 30 tablet 6  . predniSONE (DELTASONE) 5 MG tablet Resume 5 mg daily after you have completed the 10mg  tab taper (Patient taking differently: Take 5 mg by mouth daily. )    . roflumilast (DALIRESP) 500 MCG TABS tablet Take 500 mcg by mouth daily.    . Tiotropium Bromide Monohydrate (SPIRIVA RESPIMAT) 1.25 MCG/ACT AERS Inhale 2 puffs into the lungs daily. 1 Inhaler 3  . torsemide (DEMADEX) 20 MG tablet Take 1 tablet (20 mg total) by mouth daily. 90 tablet 0   No facility-administered medications prior to visit.       Past Surgical History:  Procedure Laterality Date  . APPENDECTOMY  05/03/2013  . CARDIAC CATHETERIZATION  03/2012  . CARDIAC CATHETERIZATION N/A 01/08/2016   Procedure: Right Heart Cath;  Surgeon: Dolores Pattyaniel R Bensimhon, MD;  [facility];  Service: Cardiovascular;  Laterality: N/A;  . CARDIAC SURGERY  10/16/1977   "my heart was backwards" (05/03/2013)  . LAPAROSCOPIC APPENDECTOMY N/A 05/03/2013   Procedure: APPENDECTOMY LAPAROSCOPIC;  Surgeon: Cherylynn RidgesJames O Wyatt, MD;  [facility];  Service: General;  Laterality: N/A;  . LUNG SURGERY  [birth year]16/1977  . RIGHT HEART CATHETERIZATION N/A 03/07/2012   Procedure: RIGHT HEART CATH;  Surgeon: Kathleene Hazelhristopher D McAlhany, MD;  [facility];  Service: Cardiovascular;  Laterality: N/A;      Past Medical History:  Diagnosis Date  . Axillary adenopathy    right axillary adenopathy noted on CT chest (03/06/2012)  . Chronic right-sided CHF (congestive heart failure) (HCC) 03/23/2012   with cor pulmonale. Last [facility] 03/2012  . COPD (chronic obstructive pulmonary disease) (HCC)    PFTS 03/08/12: fev1 0.58L/1%, FVC 1.18/33%, Ratop 49 and c/w ssevere obstruction. 21% BD response on FVC, RV 219%, DLCO 11/54%  . DVT of upper extremity (deep vein thrombosis) [facility](HCC) April 2013    right subclavian // Unclear precipitating cause - possibly significant right heart failure, with vascular stasis potentially predisposing to clotting.    . Exertional shortness of breath   . Hepatic cirrhosis (HCC)    Questionable history of - Noted on CT abdomen (03/2012) - thought to be due to vascular congestion from right heart failure +/- patient's history of alcohol abuse  . History of alcoholism (HCC)   . History of chronic bronchitis   . Irregular menses   . Migraine    "~ 1/yr" (05/03/2013)  . Moderate to severe pulmonary hypertension [facility](HCC) April 2013   Cardiac cath on 03/06/12 - 1. Elevated pulmonary artery pressures, right sided filling pressures.,  2. PA: 64/45 (mean 53)    . On home oxygen therapy    "2-3 L 24/7" (05/03/2013)  . OSA (obstructive sleep apnea)    wears noctural BiPAP (05/03/2013)  . Periodontal disease   . Pneumonia ~ 1985; 01/2011  . Pulmonary embolus [facility](HCC) April 2013   Precipitating cause unclear. Was treated with coumadin from April-June 2013.  . Pulmonary  hypertension (HCC)       Review of Systems  Constitutional: Negative for chills and fever.  Respiratory: Negative for chest tightness, shortness of breath and wheezing.   Cardiovascular: Negative for chest pain, palpitations and leg swelling.  Neurological:       Positive for neuropathy.       Objective:    Pulse 96   Resp 18   Ht 5\' 2"  (1.575 m)   Wt 203 lb (92.1 kg)   LMP 04/20/2017 (Approximate)   SpO2 95%   BMI 37.13 kg/m  Nursing note and vital signs reviewed.  Physical Exam  Constitutional: She is oriented to person, place, and time. She appears well-developed and well-nourished. No distress.  Cardiovascular: Normal rate, regular rhythm, normal heart sounds and intact distal pulses.   Pulmonary/Chest: Effort normal. No tachypnea. No respiratory distress. She has decreased breath sounds. She has wheezes. She has no rhonchi.  Neurological: She is alert and oriented to person, place, and  time.  Skin: Skin is warm and dry.  Psychiatric: She has a normal mood and affect. Her behavior is normal. Judgment and thought content normal.       Assessment & Plan:   Problem List Items Addressed This Visit      Respiratory   Chronic respiratory failure with hypoxia and hypercapnia (HCC) - Primary    Chronic respiratory failure with hypoxia and hypercapnia appears stable with current medication regimen and no adverse side effects. Does continue to experience some diabetic neuropathy and was restarted Lyrica without difficulties. Encouraged to follow-up with pulmonology to determine continued appropriateness of Lyrica given she has been on it for significant. Time with no adverse side effects. Continue 3 L of oxygen via nasal cannula and BiPAP at night. Continue current dosage of Symbicort, Daliresp, albuterol, and Spiriva. Follow-up and changes per pulmonology.          I am having Ms. Alameda start on pregabalin and traMADol. I am also having her maintain her albuterol, potassium chloride SA, roflumilast, pantoprazole, torsemide, guaiFENesin, budesonide-formoterol, predniSONE, insulin lispro, insulin glargine, Tiotropium Bromide Monohydrate, and gabapentin.   Meds ordered this encounter  Medications  . pregabalin (LYRICA) 50 MG capsule    Sig: Take 1 capsule (50 mg total) by mouth 3 (three) times daily.    Dispense:  90 capsule    Refill:  0    Order Specific Question:   Supervising Provider    Answer:   Hillard Danker A [[id number]]  . traMADol (ULTRAM) 50 MG tablet    Sig: Take 1 tablet (50 mg total) by mouth 2 (two) times daily as needed for moderate pain or severe pain.    Dispense:  60 tablet    Refill:  0    Order Specific Question:   Supervising Provider    Answer:   Hillard Danker A [[id number]]     Follow-up: Return in about 1 month (around 06/05/2017), or if symptoms worsen or fail to improve.  Jeanine Luz, FNP

## 2017-05-06 NOTE — Patient Instructions (Addendum)
Thank you for choosing [facility].  SUMMARY AND INSTRUCTIONS:  Please continue to take your medications as prescribed. Continue to use the Tramadol only as needed.  Follow up with Pulmonology.  Medication:  Your prescription(s) have been submitted to your pharmacy or been printed and provided for you. Please take as directed and contact our office if you believe you are having problem(s) with the medication(s) or have any questions.  Follow up:  If your symptoms worsen or fail to improve, please contact our office for further instruction, or in case of emergency go directly to the emergency room at the closest medical facility.

## 2017-05-06 NOTE — Assessment & Plan Note (Signed)
She is certainly deteriorated since her last transplant evaluation, now requiring oxygen constantly with repeated hospitalizations. I encouraged her to undertake a weight loss program and if she is successful she will need referral again for transplant evaluation

## 2017-05-06 NOTE — Assessment & Plan Note (Signed)
Chronic respiratory failure with hypoxia and hypercapnia appears stable with current medication regimen and no adverse side effects. Does continue to experience some diabetic neuropathy and was restarted Lyrica without difficulties. Encouraged to follow-up with pulmonology to determine continued appropriateness of Lyrica given she has been on it for significant. Time with no adverse side effects. Continue 3 L of oxygen via nasal cannula and BiPAP at night. Continue current dosage of Symbicort, Daliresp, albuterol, and Spiriva. Follow-up and changes per pulmonology.

## 2017-05-06 NOTE — Telephone Encounter (Signed)
Pt seen on 05/06/2017 by RA. Will sign off.

## 2017-05-06 NOTE — Patient Instructions (Signed)
We will check download report from your BiPAP machine Continue on current settings 20/10 with 4 L oxygen blended in for now  We will try to get you a different machine with a backup rate

## 2017-05-06 NOTE — Assessment & Plan Note (Signed)
She does not seem to have significant OSA. She does have significant REM related hypoventilation-due to underlying cardiac pulmonary disease, this is notable even on bilevel with 4 L of oxygen blended in  She is now on current bilevel settings of 20/10 with 4 L oxygen blended in but due to ongoing hypoventilation, would suggest that we change her to a backup rate. This can either be provided by using AVAPS mode or by using Trilogy noninvasive ventilation. We will request DME to provide this for her

## 2017-05-06 NOTE — Progress Notes (Signed)
Subjective:    Patient ID: Mary HakimMichelle L [name], female    DOB: 11/09/1976, 41 y.o.   MRN: [id number]  HPI   Mary DusterMichelle is a 41 year old woman with severe COPD and chronic respiratory failure referred for evaluation of sleep-disordered breathing. [facility]/2016 but turned down since she was "not sick enough" and also was overweight. Since then she had frequent hospitalizations, at least 1-2 every month for COPD exacerbations. Her ABG shows compensated hypercarbia  Last ABG 04/26/2017 was 7.38/73/91 on bilevel and 7.39/85/55 on 4 L oxygen  Epworth sleepiness score is 10. Bedtime is around 10 PM, she is currently disabled and lives with her husband and 2 dogs, sleep latency is minimal, she reports one to 2 nocturnal awakenings including nocturia and is out of bed by 7 AM feeling tired with occasional dryness of mouth but denies headaches. She denies pedal edema, orthopnea paroxysmal nocturnal dyspnea  After her last admission for/2018 she was discharged on bilevel 20/10 with 4 L oxygen blended in She underwent [facility] tit[facility] reviewed the results of this study, required bilevel 19/11 cm with 4 L oxygen 100 and but continued to have severe desaturations especially during REM sleep down to 70%  Significant tests/ events reviewed  04/2012 PFTs FEV1 24%, ratio 48, positive bronchodilator response with a 31% change, FVC 42% DLCO 32% 2013 Alpha 1 MM 340  HRCT Chest 01/2017 >no ILD , RUL nodule -ggo 2.1cm  stable since 2016 (slightly enlarged from 2015)   Echo EF 65%, Gr 1 DD , PAP 42mmHg.  NPSG 03/2012 >AHI 4.6/hr , O2 desats 73%  CPAP 04/2017  19/11 + 4L O2, severe REM desatns  Echo 01/2017-normal LVEF, RV dilated with RVSP 42  Past Medical History:  Diagnosis Date  . Axillary adenopathy    right axillary adenopathy noted on CT chest (03/06/2012)  . Chronic right-sided CHF (congestive heart failure) (HCC) 03/23/2012   with cor pulmonale.  Last RHC 03/2012  . COPD (chronic obstructive pulmonary disease) (HCC)    PFTS 03/08/12: fev1 0.58L/1%, FVC 1.18/33%, Ratop 49 and c/w ssevere obstruction. 21% BD response on FVC, RV 219%, DLCO 11/54%  . DVT of upper extremity (deep vein thrombosis) [facility](HCC) April 2013   right subclavian // Unclear precipitating cause - possibly significant right heart failure, with vascular stasis potentially predisposing to clotting.    . Exertional shortness of breath   . Hepatic cirrhosis (HCC)    Questionable history of - Noted on CT abdomen (03/2012) - thought to be due to vascular congestion from right heart failure +/- patient's history of alcohol abuse  . History of alcoholism (HCC)   . History of chronic bronchitis   . Irregular menses   . Migraine    "~ 1/yr" (05/03/2013)  . Moderate to severe pulmonary hypertension [facility](HCC) April 2013   Cardiac cath on 03/06/12 - 1. Elevated pulmonary artery pressures, right sided filling pressures.,  2. PA: 64/45 (mean 53)    . On home oxygen therapy    "2-3 L 24/7" (05/03/2013)  . OSA (obstructive sleep apnea)    wears noctural BiPAP (05/03/2013)  . Periodontal disease   . Pneumonia ~ 1985; 01/2011  . Pulmonary embolus [facility](HCC) April 2013   Precipitating cause unclear. Was treated with coumadin from April-June 2013.  . Pulmonary hypertension (HCC)      Past Surgical History:  Procedure Laterality Date  . APPENDECTOMY  05/03/2013  . CARDIAC CATHETERIZATION  03/2012  .  CARDIAC CATHETERIZATION N/A 01/08/2016   Procedure: Right Heart Cath;  Surgeon: Dolores Patty, MD;  Location: [facility] INVASIVE CV LAB;  Service: Cardiovascular;  Laterality: N/A;  . CARDIAC SURGERY  12/01/1977   "my heart was backwards" (05/03/2013)  . LAPAROSCOPIC APPENDECTOMY N/A 05/03/2013   Procedure: APPENDECTOMY LAPAROSCOPIC;  Surgeon: Cherylynn Ridges, MD;  Location: [facility] OR;  Service: General;  Laterality: N/A;  . LUNG SURGERY  December 18, 1976  . RIGHT HEART CATHETERIZATION N/A 03/07/2012   Procedure: RIGHT  HEART CATH;  Surgeon: Kathleene Hazel, MD;  Location[facility] C[facility] LAB;  Service: Cardiovascular;  Laterality: N/A;    No Known Allergies   Social History   Social History  . Marital status: Married    Spouse name: N/A  . Number of children: 0  . Years of education: 14   Occupational History  . Unemployed [employer]   Social History Main Topics  . Smoking status: Current Some Day Smoker    Packs/day: 1.00    Years: 25.00    Types: Cigarettes    Last attempt to quit: 02/18/2012  . Smokeless tobacco: Never Used     Comment: smokes 1-2 cigs per day (03/14/17)  . Alcohol use 0.6 oz/week    1 Shots of liquor per week  . Drug use: No  . Sexual activity: Yes    Partners: Male   Other Topics Concern  . Not on file   Social History Narrative   Lives with husband in [place]   Has a farm with multiple animals including chickens, [place] pigs, cockatil (bird), dogs and cats   Completed GED and 2 years of college   Smoking-quit smoking >1 year since 2014   Not working    Fun: Land with her dog and go fishing     Family History  Problem Relation Age of Onset  . Hypertension Father   . Heart disease Father        CHF; died age 20   . Alcohol abuse Father   . Other Brother        died age 28 y.o overdose      Review of Systems Positive for dyspnea, dry cough, intermittent wheezing, chronic hoarseness of voice  Constitutional: negative for anorexia, fevers and sweats  Eyes: negative for irritation, redness and visual disturbance  Ears, nose, mouth, throat, and face: negative for earaches, epistaxis, nasal congestion and sore throat  Cardiovascular: negative for chest pain, dyspnea, lower extremity edema, orthopnea, palpitations and syncope  Gastrointestinal: negative for abdominal pain, constipation, diarrhea, melena, nausea and vomiting  Genitourinary:negative for dysuria, frequency and hematuria  Hematologic/lymphatic: negative for  bleeding, easy bruising and lymphadenopathy  Musculoskeletal:negative for arthralgias, muscle weakness and stiff joints  Neurological: negative for coordination problems, gait problems, headaches and weakness  Endocrine: negative for diabetic symptoms including polydipsia, polyuria and weight loss     Objective:   Physical Exam  Gen. Pleasant, obese, in no distress, normal affect ENT - no lesions, no post nasal drip, class 2-3 airway Neck: No JVD, no thyromegaly, no carotid bruits Lungs: no use of accessory muscles, no dullness to percussion, decreased without rales or rhonchi  Cardiovascular: Rhythm regular, heart sounds  normal, no murmurs or gallops, no peripheral edema Abdomen: soft and non-tender, no hepatosplenomegaly, BS normal. Musculoskeletal: No deformities, no cyanosis or clubbing Neuro:  alert, non focal, no tremors       Assessment & Plan:

## 2017-05-09 DIAGNOSIS — I270 Primary pulmonary hypertension: Secondary | ICD-10-CM | POA: Diagnosis not present

## 2017-05-09 DIAGNOSIS — J449 Chronic obstructive pulmonary disease, unspecified: Secondary | ICD-10-CM | POA: Diagnosis not present

## 2017-05-09 DIAGNOSIS — G4733 Obstructive sleep apnea (adult) (pediatric): Secondary | ICD-10-CM | POA: Diagnosis not present

## 2017-05-10 ENCOUNTER — Telehealth: Payer: Self-pay | Admitting: Pulmonary Disease

## 2017-05-10 DIAGNOSIS — J9621 Acute and chronic respiratory failure with hypoxia: Secondary | ICD-10-CM | POA: Diagnosis not present

## 2017-05-10 DIAGNOSIS — J9622 Acute and chronic respiratory failure with hypercapnia: Secondary | ICD-10-CM | POA: Diagnosis not present

## 2017-05-10 DIAGNOSIS — J441 Chronic obstructive pulmonary disease with (acute) exacerbation: Secondary | ICD-10-CM | POA: Diagnosis not present

## 2017-05-10 NOTE — Telephone Encounter (Signed)
   Dr. Vassie LollAlva, Efraim KaufmannMelissa from [facility] states in order for the patient to receive the [facility] needs the following things: Order needs to have a diagnosis code of COPD and chronic respiratory failure. Order needs NIV settings. If you are agreeable to this, Efraim KaufmannMelissa will bring over the order form for it to be completed.  Also, the patient has an outstanding balance and is in collections with [facility]. She needs to make some type of payment arrangement. They have been trying to get in contact with her for the past few days but have been unsuccessful.    RA, please advise. Thanks!

## 2017-05-11 ENCOUNTER — Telehealth: Payer: Self-pay | Admitting: Family

## 2017-05-11 DIAGNOSIS — J449 Chronic obstructive pulmonary disease, unspecified: Secondary | ICD-10-CM | POA: Diagnosis not present

## 2017-05-11 DIAGNOSIS — G4733 Obstructive sleep apnea (adult) (pediatric): Secondary | ICD-10-CM | POA: Diagnosis not present

## 2017-05-11 DIAGNOSIS — I270 Primary pulmonary hypertension: Secondary | ICD-10-CM | POA: Diagnosis not present

## 2017-05-11 NOTE — Telephone Encounter (Signed)
OK for this PL let pt know about collections issue & ask her if she is willing

## 2017-05-11 NOTE — Telephone Encounter (Signed)
Spoke with patient. Advised her that [facility] had been trying to in contact with her. Advised her of the collections and told her if she still wants the machine, she should contact [facility]. Will await Melissa's call that she spoke with patient and the order can be placed.

## 2017-05-11 NOTE — Telephone Encounter (Signed)
Gave verbal ok per Greg. 

## 2017-05-11 NOTE — Telephone Encounter (Signed)
Needs verbals for an evaluation for skilled nursing for cardio pulmonary program

## 2017-05-12 ENCOUNTER — Telehealth: Payer: Self-pay | Admitting: Family

## 2017-05-12 NOTE — Telephone Encounter (Signed)
VERBAL ORDERS FOR PT:  2X A WEEK FOR 4WEEKS  [phone] - ERIN WITH [facility]

## 2017-05-13 DIAGNOSIS — J441 Chronic obstructive pulmonary disease with (acute) exacerbation: Secondary | ICD-10-CM | POA: Diagnosis not present

## 2017-05-13 DIAGNOSIS — J9622 Acute and chronic respiratory failure with hypercapnia: Secondary | ICD-10-CM | POA: Diagnosis not present

## 2017-05-13 DIAGNOSIS — J9621 Acute and chronic respiratory failure with hypoxia: Secondary | ICD-10-CM | POA: Diagnosis not present

## 2017-05-13 NOTE — Telephone Encounter (Signed)
Gave verbal ok per Greg. 

## 2017-05-15 NOTE — Procedures (Signed)
[facility] Stassi Fadely A. Gerilyn Pilgrim, MD     [email or web address]             NOCTURNAL POLYSOMNOGRAPHY   LOCATION: [facility]   Patient Name: Mary [name], Mary [name] Date: 05/04/2017 Gender: Female D.O.B: [birth year] Age (years): 40 Referring Provider: Maxie Barb MD Height (inches): 62 Interpreting Physician: Beryle Beams MD, ABSM Weight (lbs): 203 RPSGT: Peak, Robert BMI: 37 MRN: [id number] Neck Size: 17.00 CLINICAL INFORMATION The patient is referred for a BiPAP titration to treat sleep apnea.  Date of NPSG, Split Night or HST:  SLEEP STUDY TECHNIQUE As per the AASM Manual for the Scoring of Sleep and Associated Events v2.3 (April 2016) with a hypopnea requiring 4% desaturations.  The channels recorded and monitored were frontal, central and occipital EEG, electrooculogram (EOG), submentalis EMG (chin), nasal and oral airflow, thoracic and abdominal wall motion, anterior tibialis EMG, snore microphone, electrocardiogram, and pulse oximetry. Bilevel positive airway pressure (BPAP) was initiated at the beginning of the study and titrated to treat sleep-disordered breathing.  MEDICATIONS Medications self-administered by patient taken the night of the study : N/A  Current Outpatient Prescriptions:  .  albuterol (PROVENTIL HFA;VENTOLIN HFA) 108 (90 BASE) MCG/ACT inhaler, Inhale 2 puffs into the lungs every 6 (six) hours as needed for wheezing or shortness of breath., Disp: 8.5 g, Rfl: 1 .  budesonide-formoterol (SYMBICORT) 160-4.5 MCG/ACT inhaler, Inhale 2 puffs into the lungs 2 (two) times daily., Disp: 1 Inhaler, Rfl: 12 .  gabapentin (NEURONTIN) 300 MG capsule, TAKE 2 CAPSULES BY MOUTH 3 TIMES DAILY., Disp: 180 capsule, Rfl: 2 .  guaiFENesin (MUCINEX) 600 MG 12 hr tablet, Take 1 tablet (600 mg total) by mouth 2 (two) times daily., Disp: 40 tablet, Rfl: 0 .  insulin glargine (LANTUS) 100 UNIT/ML injection, Inject 0.25 mLs (25 Units total) into the skin daily.,  Disp: , Rfl:  .  insulin lispro (HUMALOG) 100 UNIT/ML injection, Inject 0-0.15 mLs (0-15 Units total) into the skin 3 (three) times daily with meals. CBG < 70: Eat or drink something right away and recheck. CBG 70 - 120: 0 units CBG 121 - 150: 2 units CBG 151 - 200: 3 units CBG 201 - 250: 5 units CBG 251 - 300: 8 units CBG 301 - 350: 11 units CBG 351 - 400: 15 units CBG > 400: call MD., Disp: , Rfl:  .  pantoprazole (PROTONIX) 40 MG tablet, Take 1 tablet (40 mg total) by mouth 2 (two) times daily before a meal., Disp: 60 tablet, Rfl: 0 .  potassium chloride SA (K-DUR,KLOR-CON) 20 MEQ tablet, Take 1 tablet (20 mEq total) by mouth daily., Disp: 30 tablet, Rfl: 6 .  predniSONE (DELTASONE) 5 MG tablet, Resume 5 mg daily after you have completed the 10mg  tab taper (Patient taking differently: Take 5 mg by mouth daily. ), Disp: , Rfl:  .  pregabalin (LYRICA) 50 MG capsule, Take 1 capsule (50 mg total) by mouth 3 (three) times daily., Disp: 90 capsule, Rfl: 0 .  roflumilast (DALIRESP) 500 MCG TABS tablet, Take 500 mcg by mouth daily., Disp: , Rfl:  .  Tiotropium Bromide Monohydrate (SPIRIVA RESPIMAT) 1.25 MCG/ACT AERS, Inhale 2 puffs into the lungs daily., Disp: 1 Inhaler, Rfl: 3 .  torsemide (DEMADEX) 20 MG tablet, Take 1 tablet (20 mg total) by mouth daily., Disp: 90 tablet, Rfl: 0 .  traMADol (ULTRAM) 50 MG tablet, Take 1 tablet (50 mg total) by mouth 2 (two) times daily as needed for moderate pain or severe pain.,  Disp: 60 tablet, Rfl: 0   RESPIRATORY PARAMETERS  The patient was titrated on Bilevel  pressures from 12/8 to 19/11. She had severe REM related oxygen de-saturation not associated with apneic events. These de-saturatiion occurred even BiPAP.  Optimal IPAP Pressure (cm): 19 AHI at Optimal Pressure (/hr) N/A Optimal EPAP Pressure (cm): 11     Overall Minimal O2 (%):   Minimal O2 at Optimal Pressure (%): 61.5   SLEEP ARCHITECTURE Start Time: 11:06:56 PM Stop Time: 5:09:58 AM Total Time  (min): 363.0 Total Sleep Time (min): 343.4 Sleep Latency (min): 8.6 Sleep Efficiency (%): 94.6 REM Latency (min): 43.5 WASO (min): 11.0 Stage N1 (%): 3.06 Stage N2 (%): 82.53 Stage N3 (%): 4.51 Stage R (%): 9.90 Supine (%): 4.01 Arousal Index (/hr): 13.8      CARDIAC DATA The 2 lead EKG demonstrated sinus rhythm. The mean heart rate was 95.23 beats per minute. Other EKG findings include: None. LEG MOVEMENT DATA The total Periodic Limb Movements of Sleep (PLMS) were 0. The PLMS index was 0.00. A PLMS index of <15 is considered normal in adults.  IMPRESSIONS - Optimal BiPAP pressures of 19/11 is selected for this patient with 4 Liters supplemental oxygen. - Severe REM related hypoventilation is noted. Nocturnal supplemental oxygen 4 liters is suggested.   Argie RammingKofi A Willette Mudry, MD Diplomate, American Board of Sleep Medicine.  ELECTRONICALLY SIGNED ON:  05/15/2017, 12:12 AM [facility] PH: [phone]   FX: [phone] ACCREDITED BY THE AMERICAN ACADEMY OF SLEEP MEDICINE

## 2017-05-16 DIAGNOSIS — J441 Chronic obstructive pulmonary disease with (acute) exacerbation: Secondary | ICD-10-CM | POA: Diagnosis not present

## 2017-05-16 DIAGNOSIS — J9622 Acute and chronic respiratory failure with hypercapnia: Secondary | ICD-10-CM | POA: Diagnosis not present

## 2017-05-16 DIAGNOSIS — J9621 Acute and chronic respiratory failure with hypoxia: Secondary | ICD-10-CM | POA: Diagnosis not present

## 2017-05-17 DIAGNOSIS — J441 Chronic obstructive pulmonary disease with (acute) exacerbation: Secondary | ICD-10-CM | POA: Diagnosis not present

## 2017-05-17 DIAGNOSIS — J9621 Acute and chronic respiratory failure with hypoxia: Secondary | ICD-10-CM | POA: Diagnosis not present

## 2017-05-17 DIAGNOSIS — J9622 Acute and chronic respiratory failure with hypercapnia: Secondary | ICD-10-CM | POA: Diagnosis not present

## 2017-05-19 DIAGNOSIS — J9621 Acute and chronic respiratory failure with hypoxia: Secondary | ICD-10-CM | POA: Diagnosis not present

## 2017-05-19 DIAGNOSIS — J9622 Acute and chronic respiratory failure with hypercapnia: Secondary | ICD-10-CM | POA: Diagnosis not present

## 2017-05-19 DIAGNOSIS — J441 Chronic obstructive pulmonary disease with (acute) exacerbation: Secondary | ICD-10-CM | POA: Diagnosis not present

## 2017-05-20 ENCOUNTER — Ambulatory Visit: Payer: 59 | Admitting: Adult Health

## 2017-05-23 ENCOUNTER — Other Ambulatory Visit: Payer: Self-pay | Admitting: Internal Medicine

## 2017-05-25 DIAGNOSIS — J9622 Acute and chronic respiratory failure with hypercapnia: Secondary | ICD-10-CM | POA: Diagnosis not present

## 2017-05-25 DIAGNOSIS — J9621 Acute and chronic respiratory failure with hypoxia: Secondary | ICD-10-CM | POA: Diagnosis not present

## 2017-05-25 DIAGNOSIS — J441 Chronic obstructive pulmonary disease with (acute) exacerbation: Secondary | ICD-10-CM | POA: Diagnosis not present

## 2017-05-26 ENCOUNTER — Telehealth: Payer: Self-pay | Admitting: Family

## 2017-05-26 NOTE — Telephone Encounter (Signed)
Pharmacy called in and said that ins is not going to cover 6 a day, ins will only cover 3 a day of the pregabalin (LYRICA) 50 MG capsule [[id number]   Do you want to change the dose or to a pa for the 6?   Please contact pharmacy

## 2017-05-26 NOTE — Telephone Encounter (Signed)
Last RX is for 1 cap TID, Pharmacy notified

## 2017-05-27 DIAGNOSIS — J441 Chronic obstructive pulmonary disease with (acute) exacerbation: Secondary | ICD-10-CM | POA: Diagnosis not present

## 2017-05-27 DIAGNOSIS — J9621 Acute and chronic respiratory failure with hypoxia: Secondary | ICD-10-CM | POA: Diagnosis not present

## 2017-05-27 DIAGNOSIS — J9622 Acute and chronic respiratory failure with hypercapnia: Secondary | ICD-10-CM | POA: Diagnosis not present

## 2017-05-29 ENCOUNTER — Other Ambulatory Visit: Payer: Self-pay | Admitting: Family

## 2017-05-30 NOTE — Telephone Encounter (Signed)
Last refill was 04/21/17 per [place] [facility]

## 2017-05-31 DIAGNOSIS — J9621 Acute and chronic respiratory failure with hypoxia: Secondary | ICD-10-CM | POA: Diagnosis not present

## 2017-05-31 DIAGNOSIS — J441 Chronic obstructive pulmonary disease with (acute) exacerbation: Secondary | ICD-10-CM | POA: Diagnosis not present

## 2017-05-31 DIAGNOSIS — J9622 Acute and chronic respiratory failure with hypercapnia: Secondary | ICD-10-CM | POA: Diagnosis not present

## 2017-05-31 NOTE — Telephone Encounter (Signed)
Rx faxed

## 2017-06-02 ENCOUNTER — Telehealth: Payer: Self-pay | Admitting: Family

## 2017-06-02 DIAGNOSIS — J441 Chronic obstructive pulmonary disease with (acute) exacerbation: Secondary | ICD-10-CM | POA: Diagnosis not present

## 2017-06-02 DIAGNOSIS — J9622 Acute and chronic respiratory failure with hypercapnia: Secondary | ICD-10-CM | POA: Diagnosis not present

## 2017-06-02 DIAGNOSIS — J9621 Acute and chronic respiratory failure with hypoxia: Secondary | ICD-10-CM | POA: Diagnosis not present

## 2017-06-02 NOTE — Telephone Encounter (Signed)
Erin from [facility] called requesting verbal order for ongoing home PT two times a week for five more weeks for activity tolerance and strengthening.

## 2017-06-03 DIAGNOSIS — J9621 Acute and chronic respiratory failure with hypoxia: Secondary | ICD-10-CM | POA: Diagnosis not present

## 2017-06-03 DIAGNOSIS — J441 Chronic obstructive pulmonary disease with (acute) exacerbation: Secondary | ICD-10-CM | POA: Diagnosis not present

## 2017-06-03 DIAGNOSIS — J9622 Acute and chronic respiratory failure with hypercapnia: Secondary | ICD-10-CM | POA: Diagnosis not present

## 2017-06-03 NOTE — Telephone Encounter (Signed)
LVM letting Denny Peonrin know ok for verbal order for pt.

## 2017-06-06 DIAGNOSIS — J441 Chronic obstructive pulmonary disease with (acute) exacerbation: Secondary | ICD-10-CM | POA: Diagnosis not present

## 2017-06-06 DIAGNOSIS — J9622 Acute and chronic respiratory failure with hypercapnia: Secondary | ICD-10-CM | POA: Diagnosis not present

## 2017-06-06 DIAGNOSIS — J9621 Acute and chronic respiratory failure with hypoxia: Secondary | ICD-10-CM | POA: Diagnosis not present

## 2017-06-07 DIAGNOSIS — J441 Chronic obstructive pulmonary disease with (acute) exacerbation: Secondary | ICD-10-CM | POA: Diagnosis not present

## 2017-06-07 DIAGNOSIS — J9622 Acute and chronic respiratory failure with hypercapnia: Secondary | ICD-10-CM | POA: Diagnosis not present

## 2017-06-07 DIAGNOSIS — J9621 Acute and chronic respiratory failure with hypoxia: Secondary | ICD-10-CM | POA: Diagnosis not present

## 2017-06-08 DIAGNOSIS — G4733 Obstructive sleep apnea (adult) (pediatric): Secondary | ICD-10-CM | POA: Diagnosis not present

## 2017-06-08 DIAGNOSIS — J449 Chronic obstructive pulmonary disease, unspecified: Secondary | ICD-10-CM | POA: Diagnosis not present

## 2017-06-08 DIAGNOSIS — I270 Primary pulmonary hypertension: Secondary | ICD-10-CM | POA: Diagnosis not present

## 2017-06-09 DIAGNOSIS — J9621 Acute and chronic respiratory failure with hypoxia: Secondary | ICD-10-CM | POA: Diagnosis not present

## 2017-06-09 DIAGNOSIS — J441 Chronic obstructive pulmonary disease with (acute) exacerbation: Secondary | ICD-10-CM | POA: Diagnosis not present

## 2017-06-09 DIAGNOSIS — J9622 Acute and chronic respiratory failure with hypercapnia: Secondary | ICD-10-CM | POA: Diagnosis not present

## 2017-06-10 DIAGNOSIS — I270 Primary pulmonary hypertension: Secondary | ICD-10-CM | POA: Diagnosis not present

## 2017-06-10 DIAGNOSIS — G4733 Obstructive sleep apnea (adult) (pediatric): Secondary | ICD-10-CM | POA: Diagnosis not present

## 2017-06-10 DIAGNOSIS — J449 Chronic obstructive pulmonary disease, unspecified: Secondary | ICD-10-CM | POA: Diagnosis not present

## 2017-06-13 DIAGNOSIS — J441 Chronic obstructive pulmonary disease with (acute) exacerbation: Secondary | ICD-10-CM | POA: Diagnosis not present

## 2017-06-13 DIAGNOSIS — J9621 Acute and chronic respiratory failure with hypoxia: Secondary | ICD-10-CM | POA: Diagnosis not present

## 2017-06-13 DIAGNOSIS — J9622 Acute and chronic respiratory failure with hypercapnia: Secondary | ICD-10-CM | POA: Diagnosis not present

## 2017-06-14 DIAGNOSIS — J9621 Acute and chronic respiratory failure with hypoxia: Secondary | ICD-10-CM | POA: Diagnosis not present

## 2017-06-14 DIAGNOSIS — J441 Chronic obstructive pulmonary disease with (acute) exacerbation: Secondary | ICD-10-CM | POA: Diagnosis not present

## 2017-06-14 DIAGNOSIS — J9622 Acute and chronic respiratory failure with hypercapnia: Secondary | ICD-10-CM | POA: Diagnosis not present

## 2017-06-16 DIAGNOSIS — J441 Chronic obstructive pulmonary disease with (acute) exacerbation: Secondary | ICD-10-CM | POA: Diagnosis not present

## 2017-06-16 DIAGNOSIS — J9621 Acute and chronic respiratory failure with hypoxia: Secondary | ICD-10-CM | POA: Diagnosis not present

## 2017-06-16 DIAGNOSIS — J9622 Acute and chronic respiratory failure with hypercapnia: Secondary | ICD-10-CM | POA: Diagnosis not present

## 2017-06-21 DIAGNOSIS — J9622 Acute and chronic respiratory failure with hypercapnia: Secondary | ICD-10-CM | POA: Diagnosis not present

## 2017-06-21 DIAGNOSIS — J9621 Acute and chronic respiratory failure with hypoxia: Secondary | ICD-10-CM | POA: Diagnosis not present

## 2017-06-21 DIAGNOSIS — J441 Chronic obstructive pulmonary disease with (acute) exacerbation: Secondary | ICD-10-CM | POA: Diagnosis not present

## 2017-06-23 ENCOUNTER — Other Ambulatory Visit: Payer: Self-pay | Admitting: Internal Medicine

## 2017-06-23 DIAGNOSIS — J9622 Acute and chronic respiratory failure with hypercapnia: Secondary | ICD-10-CM | POA: Diagnosis not present

## 2017-06-23 DIAGNOSIS — J441 Chronic obstructive pulmonary disease with (acute) exacerbation: Secondary | ICD-10-CM | POA: Diagnosis not present

## 2017-06-23 DIAGNOSIS — J9621 Acute and chronic respiratory failure with hypoxia: Secondary | ICD-10-CM | POA: Diagnosis not present

## 2017-06-27 DIAGNOSIS — J9622 Acute and chronic respiratory failure with hypercapnia: Secondary | ICD-10-CM | POA: Diagnosis not present

## 2017-06-27 DIAGNOSIS — J9621 Acute and chronic respiratory failure with hypoxia: Secondary | ICD-10-CM | POA: Diagnosis not present

## 2017-06-27 DIAGNOSIS — J441 Chronic obstructive pulmonary disease with (acute) exacerbation: Secondary | ICD-10-CM | POA: Diagnosis not present

## 2017-06-28 ENCOUNTER — Other Ambulatory Visit: Payer: Self-pay

## 2017-06-28 ENCOUNTER — Telehealth: Payer: Self-pay

## 2017-06-28 DIAGNOSIS — J441 Chronic obstructive pulmonary disease with (acute) exacerbation: Secondary | ICD-10-CM | POA: Diagnosis not present

## 2017-06-28 DIAGNOSIS — J9621 Acute and chronic respiratory failure with hypoxia: Secondary | ICD-10-CM | POA: Diagnosis not present

## 2017-06-28 DIAGNOSIS — J9622 Acute and chronic respiratory failure with hypercapnia: Secondary | ICD-10-CM | POA: Diagnosis not present

## 2017-06-28 NOTE — Telephone Encounter (Signed)
Received fax from [facility] requesting refill of tramadol. Last refill was 05/31/17 per [place] [facility]

## 2017-06-29 MED ORDER — TRAMADOL HCL 50 MG PO TABS: ORAL_TABLET | ORAL | 0 refills | Status: DC

## 2017-06-29 NOTE — Telephone Encounter (Signed)
Rx faxed

## 2017-06-29 NOTE — Telephone Encounter (Signed)
Printed and to be faxed.

## 2017-06-29 NOTE — Addendum Note (Signed)
Addended by: Jeanine LuzALONE, GREGORY D on: 06/29/2017 11:05 AM   Modules accepted: Orders

## 2017-07-01 ENCOUNTER — Telehealth: Payer: Self-pay | Admitting: Family

## 2017-07-01 DIAGNOSIS — J9621 Acute and chronic respiratory failure with hypoxia: Secondary | ICD-10-CM | POA: Diagnosis not present

## 2017-07-01 DIAGNOSIS — J9622 Acute and chronic respiratory failure with hypercapnia: Secondary | ICD-10-CM | POA: Diagnosis not present

## 2017-07-01 DIAGNOSIS — J441 Chronic obstructive pulmonary disease with (acute) exacerbation: Secondary | ICD-10-CM | POA: Diagnosis not present

## 2017-07-01 NOTE — Telephone Encounter (Signed)
Verbal Orders to continue PT  2x a week for 4 more weeks  [phone] - Erin - [facility]

## 2017-07-01 NOTE — Telephone Encounter (Signed)
LVM with ok with verbals per Tammy SoursGreg.

## 2017-07-04 ENCOUNTER — Telehealth: Payer: Self-pay | Admitting: Family

## 2017-07-04 DIAGNOSIS — J9622 Acute and chronic respiratory failure with hypercapnia: Secondary | ICD-10-CM | POA: Diagnosis not present

## 2017-07-04 DIAGNOSIS — J9621 Acute and chronic respiratory failure with hypoxia: Secondary | ICD-10-CM | POA: Diagnosis not present

## 2017-07-04 DIAGNOSIS — J441 Chronic obstructive pulmonary disease with (acute) exacerbation: Secondary | ICD-10-CM | POA: Diagnosis not present

## 2017-07-04 NOTE — Telephone Encounter (Signed)
Called requesting verbals for OT [facility]

## 2017-07-05 ENCOUNTER — Telehealth: Payer: Self-pay | Admitting: Family

## 2017-07-05 DIAGNOSIS — J9621 Acute and chronic respiratory failure with hypoxia: Secondary | ICD-10-CM | POA: Diagnosis not present

## 2017-07-05 DIAGNOSIS — J441 Chronic obstructive pulmonary disease with (acute) exacerbation: Secondary | ICD-10-CM | POA: Diagnosis not present

## 2017-07-05 DIAGNOSIS — J9622 Acute and chronic respiratory failure with hypercapnia: Secondary | ICD-10-CM | POA: Diagnosis not present

## 2017-07-05 NOTE — Telephone Encounter (Signed)
Chart reviewed and appropriate for OT.  

## 2017-07-05 NOTE — Telephone Encounter (Signed)
Scarlet from [facility] called requesting verbal orders for a nursing recertification on August 2nd.

## 2017-07-05 NOTE — Telephone Encounter (Signed)
Notified connie w/Greg response.../lmb 

## 2017-07-06 NOTE — Telephone Encounter (Signed)
LVM with Scarlet giving verbal orders per Tammy SoursGreg.

## 2017-07-07 DIAGNOSIS — J9621 Acute and chronic respiratory failure with hypoxia: Secondary | ICD-10-CM | POA: Diagnosis not present

## 2017-07-07 DIAGNOSIS — J441 Chronic obstructive pulmonary disease with (acute) exacerbation: Secondary | ICD-10-CM | POA: Diagnosis not present

## 2017-07-07 DIAGNOSIS — J9622 Acute and chronic respiratory failure with hypercapnia: Secondary | ICD-10-CM | POA: Diagnosis not present

## 2017-07-09 DIAGNOSIS — G4733 Obstructive sleep apnea (adult) (pediatric): Secondary | ICD-10-CM | POA: Diagnosis not present

## 2017-07-09 DIAGNOSIS — I270 Primary pulmonary hypertension: Secondary | ICD-10-CM | POA: Diagnosis not present

## 2017-07-09 DIAGNOSIS — J449 Chronic obstructive pulmonary disease, unspecified: Secondary | ICD-10-CM | POA: Diagnosis not present

## 2017-07-10 ENCOUNTER — Other Ambulatory Visit: Payer: Self-pay | Admitting: Internal Medicine

## 2017-07-11 DIAGNOSIS — J441 Chronic obstructive pulmonary disease with (acute) exacerbation: Secondary | ICD-10-CM | POA: Diagnosis not present

## 2017-07-11 DIAGNOSIS — J9622 Acute and chronic respiratory failure with hypercapnia: Secondary | ICD-10-CM | POA: Diagnosis not present

## 2017-07-11 DIAGNOSIS — G4733 Obstructive sleep apnea (adult) (pediatric): Secondary | ICD-10-CM | POA: Diagnosis not present

## 2017-07-11 DIAGNOSIS — J9621 Acute and chronic respiratory failure with hypoxia: Secondary | ICD-10-CM | POA: Diagnosis not present

## 2017-07-11 DIAGNOSIS — J449 Chronic obstructive pulmonary disease, unspecified: Secondary | ICD-10-CM | POA: Diagnosis not present

## 2017-07-11 DIAGNOSIS — I270 Primary pulmonary hypertension: Secondary | ICD-10-CM | POA: Diagnosis not present

## 2017-07-12 DIAGNOSIS — J441 Chronic obstructive pulmonary disease with (acute) exacerbation: Secondary | ICD-10-CM | POA: Diagnosis not present

## 2017-07-12 DIAGNOSIS — J9622 Acute and chronic respiratory failure with hypercapnia: Secondary | ICD-10-CM | POA: Diagnosis not present

## 2017-07-12 DIAGNOSIS — J9621 Acute and chronic respiratory failure with hypoxia: Secondary | ICD-10-CM | POA: Diagnosis not present

## 2017-07-14 DIAGNOSIS — J441 Chronic obstructive pulmonary disease with (acute) exacerbation: Secondary | ICD-10-CM | POA: Diagnosis not present

## 2017-07-14 DIAGNOSIS — J9622 Acute and chronic respiratory failure with hypercapnia: Secondary | ICD-10-CM | POA: Diagnosis not present

## 2017-07-14 DIAGNOSIS — J9621 Acute and chronic respiratory failure with hypoxia: Secondary | ICD-10-CM | POA: Diagnosis not present

## 2017-07-18 DIAGNOSIS — J441 Chronic obstructive pulmonary disease with (acute) exacerbation: Secondary | ICD-10-CM | POA: Diagnosis not present

## 2017-07-18 DIAGNOSIS — J9621 Acute and chronic respiratory failure with hypoxia: Secondary | ICD-10-CM | POA: Diagnosis not present

## 2017-07-18 DIAGNOSIS — J9622 Acute and chronic respiratory failure with hypercapnia: Secondary | ICD-10-CM | POA: Diagnosis not present

## 2017-07-19 DIAGNOSIS — J441 Chronic obstructive pulmonary disease with (acute) exacerbation: Secondary | ICD-10-CM | POA: Diagnosis not present

## 2017-07-19 DIAGNOSIS — J9621 Acute and chronic respiratory failure with hypoxia: Secondary | ICD-10-CM | POA: Diagnosis not present

## 2017-07-19 DIAGNOSIS — J9622 Acute and chronic respiratory failure with hypercapnia: Secondary | ICD-10-CM | POA: Diagnosis not present

## 2017-07-21 DIAGNOSIS — J9621 Acute and chronic respiratory failure with hypoxia: Secondary | ICD-10-CM | POA: Diagnosis not present

## 2017-07-21 DIAGNOSIS — J9622 Acute and chronic respiratory failure with hypercapnia: Secondary | ICD-10-CM | POA: Diagnosis not present

## 2017-07-21 DIAGNOSIS — J441 Chronic obstructive pulmonary disease with (acute) exacerbation: Secondary | ICD-10-CM | POA: Diagnosis not present

## 2017-07-25 DIAGNOSIS — J441 Chronic obstructive pulmonary disease with (acute) exacerbation: Secondary | ICD-10-CM | POA: Diagnosis not present

## 2017-07-25 DIAGNOSIS — J9621 Acute and chronic respiratory failure with hypoxia: Secondary | ICD-10-CM | POA: Diagnosis not present

## 2017-07-25 DIAGNOSIS — J9622 Acute and chronic respiratory failure with hypercapnia: Secondary | ICD-10-CM | POA: Diagnosis not present

## 2017-07-26 DIAGNOSIS — J9621 Acute and chronic respiratory failure with hypoxia: Secondary | ICD-10-CM | POA: Diagnosis not present

## 2017-07-26 DIAGNOSIS — J441 Chronic obstructive pulmonary disease with (acute) exacerbation: Secondary | ICD-10-CM | POA: Diagnosis not present

## 2017-07-26 DIAGNOSIS — J9622 Acute and chronic respiratory failure with hypercapnia: Secondary | ICD-10-CM | POA: Diagnosis not present

## 2017-07-28 DIAGNOSIS — J9621 Acute and chronic respiratory failure with hypoxia: Secondary | ICD-10-CM | POA: Diagnosis not present

## 2017-07-28 DIAGNOSIS — J9622 Acute and chronic respiratory failure with hypercapnia: Secondary | ICD-10-CM | POA: Diagnosis not present

## 2017-07-28 DIAGNOSIS — J441 Chronic obstructive pulmonary disease with (acute) exacerbation: Secondary | ICD-10-CM | POA: Diagnosis not present

## 2017-07-29 DIAGNOSIS — J441 Chronic obstructive pulmonary disease with (acute) exacerbation: Secondary | ICD-10-CM | POA: Diagnosis not present

## 2017-07-29 DIAGNOSIS — J9622 Acute and chronic respiratory failure with hypercapnia: Secondary | ICD-10-CM | POA: Diagnosis not present

## 2017-07-29 DIAGNOSIS — J9621 Acute and chronic respiratory failure with hypoxia: Secondary | ICD-10-CM | POA: Diagnosis not present

## 2017-08-01 DIAGNOSIS — J9622 Acute and chronic respiratory failure with hypercapnia: Secondary | ICD-10-CM | POA: Diagnosis not present

## 2017-08-01 DIAGNOSIS — J441 Chronic obstructive pulmonary disease with (acute) exacerbation: Secondary | ICD-10-CM | POA: Diagnosis not present

## 2017-08-01 DIAGNOSIS — J9621 Acute and chronic respiratory failure with hypoxia: Secondary | ICD-10-CM | POA: Diagnosis not present

## 2017-08-02 DIAGNOSIS — J441 Chronic obstructive pulmonary disease with (acute) exacerbation: Secondary | ICD-10-CM | POA: Diagnosis not present

## 2017-08-02 DIAGNOSIS — J9622 Acute and chronic respiratory failure with hypercapnia: Secondary | ICD-10-CM | POA: Diagnosis not present

## 2017-08-02 DIAGNOSIS — J9621 Acute and chronic respiratory failure with hypoxia: Secondary | ICD-10-CM | POA: Diagnosis not present

## 2017-08-04 DIAGNOSIS — J9621 Acute and chronic respiratory failure with hypoxia: Secondary | ICD-10-CM | POA: Diagnosis not present

## 2017-08-04 DIAGNOSIS — J441 Chronic obstructive pulmonary disease with (acute) exacerbation: Secondary | ICD-10-CM | POA: Diagnosis not present

## 2017-08-04 DIAGNOSIS — J9622 Acute and chronic respiratory failure with hypercapnia: Secondary | ICD-10-CM | POA: Diagnosis not present

## 2017-08-09 DIAGNOSIS — J9622 Acute and chronic respiratory failure with hypercapnia: Secondary | ICD-10-CM | POA: Diagnosis not present

## 2017-08-09 DIAGNOSIS — I270 Primary pulmonary hypertension: Secondary | ICD-10-CM | POA: Diagnosis not present

## 2017-08-09 DIAGNOSIS — J9621 Acute and chronic respiratory failure with hypoxia: Secondary | ICD-10-CM | POA: Diagnosis not present

## 2017-08-09 DIAGNOSIS — G4733 Obstructive sleep apnea (adult) (pediatric): Secondary | ICD-10-CM | POA: Diagnosis not present

## 2017-08-09 DIAGNOSIS — J441 Chronic obstructive pulmonary disease with (acute) exacerbation: Secondary | ICD-10-CM | POA: Diagnosis not present

## 2017-08-09 DIAGNOSIS — J449 Chronic obstructive pulmonary disease, unspecified: Secondary | ICD-10-CM | POA: Diagnosis not present

## 2017-08-11 DIAGNOSIS — J441 Chronic obstructive pulmonary disease with (acute) exacerbation: Secondary | ICD-10-CM | POA: Diagnosis not present

## 2017-08-11 DIAGNOSIS — J9622 Acute and chronic respiratory failure with hypercapnia: Secondary | ICD-10-CM | POA: Diagnosis not present

## 2017-08-11 DIAGNOSIS — I270 Primary pulmonary hypertension: Secondary | ICD-10-CM | POA: Diagnosis not present

## 2017-08-11 DIAGNOSIS — J9621 Acute and chronic respiratory failure with hypoxia: Secondary | ICD-10-CM | POA: Diagnosis not present

## 2017-08-11 DIAGNOSIS — G4733 Obstructive sleep apnea (adult) (pediatric): Secondary | ICD-10-CM | POA: Diagnosis not present

## 2017-08-11 DIAGNOSIS — J449 Chronic obstructive pulmonary disease, unspecified: Secondary | ICD-10-CM | POA: Diagnosis not present

## 2017-08-16 DIAGNOSIS — J9621 Acute and chronic respiratory failure with hypoxia: Secondary | ICD-10-CM | POA: Diagnosis not present

## 2017-08-16 DIAGNOSIS — J9622 Acute and chronic respiratory failure with hypercapnia: Secondary | ICD-10-CM | POA: Diagnosis not present

## 2017-08-16 DIAGNOSIS — J441 Chronic obstructive pulmonary disease with (acute) exacerbation: Secondary | ICD-10-CM | POA: Diagnosis not present

## 2017-08-17 ENCOUNTER — Telehealth: Payer: Self-pay

## 2017-08-17 DIAGNOSIS — J9622 Acute and chronic respiratory failure with hypercapnia: Secondary | ICD-10-CM | POA: Diagnosis not present

## 2017-08-17 DIAGNOSIS — J9621 Acute and chronic respiratory failure with hypoxia: Secondary | ICD-10-CM | POA: Diagnosis not present

## 2017-08-17 DIAGNOSIS — J441 Chronic obstructive pulmonary disease with (acute) exacerbation: Secondary | ICD-10-CM | POA: Diagnosis not present

## 2017-08-17 MED ORDER — TRAMADOL HCL 50 MG PO TABS: ORAL_TABLET | ORAL | 0 refills | Status: DC

## 2017-08-17 MED ORDER — PREGABALIN 50 MG PO CAPS: 50.0000 mg | ORAL_CAPSULE | Freq: Three times a day (TID) | ORAL | 0 refills | Status: DC

## 2017-08-17 NOTE — Telephone Encounter (Signed)
Medication printed to be faxed.  

## 2017-08-17 NOTE — Telephone Encounter (Signed)
[facility] [place] on lyrica and tramadol.   Lyrica last filled on 05/26/17 per [place] [facility] Tramdol last filled on 06/29/17 per [place] [facility]  Please advise.

## 2017-08-18 DIAGNOSIS — J9621 Acute and chronic respiratory failure with hypoxia: Secondary | ICD-10-CM | POA: Diagnosis not present

## 2017-08-18 DIAGNOSIS — J9622 Acute and chronic respiratory failure with hypercapnia: Secondary | ICD-10-CM | POA: Diagnosis not present

## 2017-08-18 DIAGNOSIS — J441 Chronic obstructive pulmonary disease with (acute) exacerbation: Secondary | ICD-10-CM | POA: Diagnosis not present

## 2017-08-18 NOTE — Telephone Encounter (Signed)
Rx faxed

## 2017-08-22 DIAGNOSIS — J441 Chronic obstructive pulmonary disease with (acute) exacerbation: Secondary | ICD-10-CM | POA: Diagnosis not present

## 2017-08-22 DIAGNOSIS — J9621 Acute and chronic respiratory failure with hypoxia: Secondary | ICD-10-CM | POA: Diagnosis not present

## 2017-08-22 DIAGNOSIS — J9622 Acute and chronic respiratory failure with hypercapnia: Secondary | ICD-10-CM | POA: Diagnosis not present

## 2017-08-24 DIAGNOSIS — J9621 Acute and chronic respiratory failure with hypoxia: Secondary | ICD-10-CM | POA: Diagnosis not present

## 2017-08-24 DIAGNOSIS — J441 Chronic obstructive pulmonary disease with (acute) exacerbation: Secondary | ICD-10-CM | POA: Diagnosis not present

## 2017-08-24 DIAGNOSIS — J9622 Acute and chronic respiratory failure with hypercapnia: Secondary | ICD-10-CM | POA: Diagnosis not present

## 2017-08-30 DIAGNOSIS — J9621 Acute and chronic respiratory failure with hypoxia: Secondary | ICD-10-CM | POA: Diagnosis not present

## 2017-08-30 DIAGNOSIS — J441 Chronic obstructive pulmonary disease with (acute) exacerbation: Secondary | ICD-10-CM | POA: Diagnosis not present

## 2017-08-30 DIAGNOSIS — J9622 Acute and chronic respiratory failure with hypercapnia: Secondary | ICD-10-CM | POA: Diagnosis not present

## 2017-08-31 DIAGNOSIS — J9622 Acute and chronic respiratory failure with hypercapnia: Secondary | ICD-10-CM | POA: Diagnosis not present

## 2017-08-31 DIAGNOSIS — J441 Chronic obstructive pulmonary disease with (acute) exacerbation: Secondary | ICD-10-CM | POA: Diagnosis not present

## 2017-08-31 DIAGNOSIS — J9621 Acute and chronic respiratory failure with hypoxia: Secondary | ICD-10-CM | POA: Diagnosis not present

## 2017-09-06 DIAGNOSIS — J441 Chronic obstructive pulmonary disease with (acute) exacerbation: Secondary | ICD-10-CM | POA: Diagnosis not present

## 2017-09-06 DIAGNOSIS — J9622 Acute and chronic respiratory failure with hypercapnia: Secondary | ICD-10-CM | POA: Diagnosis not present

## 2017-09-06 DIAGNOSIS — J9621 Acute and chronic respiratory failure with hypoxia: Secondary | ICD-10-CM | POA: Diagnosis not present

## 2017-09-08 ENCOUNTER — Other Ambulatory Visit: Payer: Self-pay | Admitting: Family

## 2017-09-08 DIAGNOSIS — I270 Primary pulmonary hypertension: Secondary | ICD-10-CM | POA: Diagnosis not present

## 2017-09-08 DIAGNOSIS — E1142 Type 2 diabetes mellitus with diabetic polyneuropathy: Secondary | ICD-10-CM

## 2017-09-08 DIAGNOSIS — J449 Chronic obstructive pulmonary disease, unspecified: Secondary | ICD-10-CM | POA: Diagnosis not present

## 2017-09-08 DIAGNOSIS — G4733 Obstructive sleep apnea (adult) (pediatric): Secondary | ICD-10-CM | POA: Diagnosis not present

## 2017-09-10 DIAGNOSIS — J449 Chronic obstructive pulmonary disease, unspecified: Secondary | ICD-10-CM | POA: Diagnosis not present

## 2017-09-10 DIAGNOSIS — G4733 Obstructive sleep apnea (adult) (pediatric): Secondary | ICD-10-CM | POA: Diagnosis not present

## 2017-09-10 DIAGNOSIS — I270 Primary pulmonary hypertension: Secondary | ICD-10-CM | POA: Diagnosis not present

## 2017-09-15 ENCOUNTER — Ambulatory Visit: Payer: 59 | Admitting: Family

## 2017-09-16 ENCOUNTER — Ambulatory Visit (INDEPENDENT_AMBULATORY_CARE_PROVIDER_SITE_OTHER): Payer: 59 | Admitting: Family

## 2017-09-16 ENCOUNTER — Other Ambulatory Visit (INDEPENDENT_AMBULATORY_CARE_PROVIDER_SITE_OTHER): Payer: 59

## 2017-09-16 ENCOUNTER — Encounter: Payer: Self-pay | Admitting: Family

## 2017-09-16 VITALS — BP 118/62 | HR 94 | Temp 98.8°F | Resp 16 | Ht 62.0 in | Wt 210.0 lb

## 2017-09-16 DIAGNOSIS — Z23 Encounter for immunization: Secondary | ICD-10-CM | POA: Diagnosis not present

## 2017-09-16 DIAGNOSIS — I272 Pulmonary hypertension, unspecified: Secondary | ICD-10-CM

## 2017-09-16 LAB — BASIC METABOLIC PANEL
BUN: 6 mg/dL (ref 6–23)
CHLORIDE: 92 meq/L — AB (ref 96–112)
CO2: 35 meq/L — AB (ref 19–32)
CREATININE: 0.6 mg/dL (ref 0.40–1.20)
Calcium: 8.8 mg/dL (ref 8.4–10.5)
GFR: 117.73 mL/min (ref >60.00)
GLUCOSE: 231 mg/dL — AB (ref 70–99)
Potassium: 4.1 mEq/L (ref 3.5–5.1)
Sodium: 136 mEq/L (ref 135–145)

## 2017-09-16 MED ORDER — TORSEMIDE 20 MG PO TABS: 20.0000 mg | ORAL_TABLET | Freq: Every day | ORAL | 0 refills | Status: DC

## 2017-09-16 NOTE — Assessment & Plan Note (Signed)
Appears stable with no symptoms of exacerbation. Remains on oxygen. Not able to use her BiPap secondary to power outage. Appear euvolemic with no symptoms of edema. Continue current dosage of torsemide and pulmonary medications. Changes and follow up per pulmonology.

## 2017-09-16 NOTE — Patient Instructions (Addendum)
Thank you for choosing [facility].  SUMMARY AND INSTRUCTIONS:  Please continue to take your medications as prescribed.  We will check your kidney function and electrolytes.   Medication:  Your prescription(s) have been submitted to your pharmacy or been printed and provided for you. Please take as directed and contact our office if you believe you are having problem(s) with the medication(s) or have any questions.  Labs:  Please stop by the lab on the lower level of the building for your blood work. Your results will be released to MyChart (or called to you) after review, usually within 72 hours after test completion. If any changes need to be made, you will be notified at that same time.  1.) The lab is open from 7:30am to 5:30 pm Monday-Friday 2.) No appointment is necessary 3.) Fasting (if needed) is 6-8 hours after food and drink; black coffee and water are okay   Follow up:  If your symptoms worsen or fail to improve, please contact our office for further instruction, or in case of emergency go directly to the emergency room at the closest medical facility.

## 2017-09-16 NOTE — Progress Notes (Signed)
Subjective:    Patient ID: Mary [name], female    DOB: Jun 17, 1976, 41 y.o.   MRN: [id number]  Chief Complaint  Patient presents with  . Medication Refill    refill of torsemide    HPI:  Mary [name] is a 41 y.o. female who  has a past medical history of Axillary adenopathy; Chronic right-sided CHF (congestive heart failure) (HCC) (03/23/2012); COPD (chronic obstructive pulmonary disease) (HCC); DVT of upper extremity (deep vein thrombosis) [facility]) (April 2013); Exertional shortness of breath; Hepatic cirrhosis (HCC); History of alcoholism (HCC); History of chronic bronchitis; Irregular menses; Migraine; Moderate to severe pulmonary hypertension [facility]) (April 2013); On home oxygen therapy; OSA (obstructive sleep apnea); Periodontal disease; Pneumonia (~ 1985; 01/2011); Pulmonary embolus [facility]) (April 2013); and Pulmonary hypertension (HCC). and presents today for a follow up office visit.    Pulmonary hypertension - Currently maintained on torsemide to help with fluid levels in addition to the breathing treatments and has noted improvements in her breathing with the medication. She has completed occupational and physical therapy which has also helped.    No Known Allergies    Outpatient Medications Prior to Visit  Medication Sig Dispense Refill  . albuterol (PROVENTIL HFA;VENTOLIN HFA) 108 (90 BASE) MCG/ACT inhaler Inhale 2 puffs into the lungs every 6 (six) hours as needed for wheezing or shortness of breath. 8.5 g 1  . budesonide-formoterol (SYMBICORT) 160-4.5 MCG/ACT inhaler Inhale 2 puffs into the lungs 2 (two) times daily. 1 Inhaler 12  . gabapentin (NEURONTIN) 300 MG capsule TAKE 2 CAPSULES BY MOUTH 3 TIMES A DAY 180 capsule 0  . guaiFENesin (MUCINEX) 600 MG 12 hr tablet Take 1 tablet (600 mg total) by mouth 2 (two) times daily. 40 tablet 0  . insulin glargine (LANTUS) 100 UNIT/ML injection Inject 0.25 mLs (25 Units total) into the skin daily.    . insulin lispro (HUMALOG)  100 UNIT/ML injection Inject 0-0.15 mLs (0-15 Units total) into the skin 3 (three) times daily with meals. CBG < 70: Eat or drink something right away and recheck. CBG 70 - 120: 0 units CBG 121 - 150: 2 units CBG 151 - 200: 3 units CBG 201 - 250: 5 units CBG 251 - 300: 8 units CBG 301 - 350: 11 units CBG 351 - 400: 15 units CBG > 400: call MD.    . pantoprazole (PROTONIX) 40 MG tablet Take 1 tablet (40 mg total) by mouth 2 (two) times daily before a meal. 60 tablet 0  . potassium chloride SA (K-DUR,KLOR-CON) 20 MEQ tablet Take 1 tablet (20 mEq total) by mouth daily. 30 tablet 6  . predniSONE (DELTASONE) 5 MG tablet Resume 5 mg daily after you have completed the  tab taper (Patient taking differently: Take 5 mg by mouth daily. )    . pregabalin (LYRICA) 50 MG capsule Take 1 capsule (50 mg total) by mouth 3 (three) times daily. 90 capsule 0  . roflumilast (DALIRESP) 500 MCG TABS tablet Take 500 mcg by mouth daily.    . Tiotropium Bromide Monohydrate (SPIRIVA RESPIMAT) 1.25 MCG/ACT AERS Inhale 2 puffs into the lungs daily. 1 Inhaler 3  . traMADol (ULTRAM) 50 MG tablet TAKE 1 TABLET BY MOUTH EVERY 6 HOURS AS NEEDED FOR SEVERE PAIN 120 tablet 0  . VENTOLIN HFA 108 (90 Base) MCG/ACT inhaler USE 2 PUFFS EVERY 6 HOURS AS NEEDED FOR WHEEZING 18 Inhaler 5  . torsemide (DEMADEX) 20 MG tablet Take 1 tablet (20 mg total) by mouth daily.  90 tablet 0   No facility-administered medications prior to visit.      Past Medical History:  Diagnosis Date  . Axillary adenopathy    right axillary adenopathy noted on CT chest (03/06/2012)  . Chronic right-sided CHF (congestive heart failure) (HCC) 03/23/2012   with cor pulmonale. Last RHC 03/2012  . COPD (chronic obstructive pulmonary disease) (HCC)    PFTS 03/08/12: fev1 0.58L/1%, FVC 1.18/33%, Ratop 49 and c/w ssevere obstruction. 21% BD response on FVC, RV 219%, DLCO 11/54%  . DVT of upper extremity (deep vein thrombosis) [facility]) April 2013   right subclavian  // Unclear precipitating cause - possibly significant right heart failure, with vascular stasis potentially predisposing to clotting.    . Exertional shortness of breath   . Hepatic cirrhosis (HCC)    Questionable history of - Noted on CT abdomen (03/2012) - thought to be due to vascular congestion from right heart failure +/- patient's history of alcohol abuse  . History of alcoholism (HCC)   . History of chronic bronchitis   . Irregular menses   . Migraine    "~ 1/yr" (05/03/2013)  . Moderate to severe pulmonary hypertension [facility]) April 2013   Cardiac cath on 03/06/12 - 1. Elevated pulmonary artery pressures, right sided filling pressures.,  2. PA: 64/45 (mean 53)    . On home oxygen therapy    "2-3 L 24/7" (05/03/2013)  . OSA (obstructive sleep apnea)    wears noctural BiPAP (05/03/2013)  . Periodontal disease   . Pneumonia ~ 1985; 01/2011  . Pulmonary embolus [facility]) April 2013   Precipitating cause unclear. Was treated with coumadin from April-June 2013.  . Pulmonary hypertension (HCC)       Review of Systems  Constitutional: Negative for chills and fever.  Respiratory: Positive for shortness of breath. Negative for chest tightness.   Cardiovascular: Negative for chest pain, palpitations and leg swelling.      Objective:    BP 118/62 (BP Location: Left Arm, Patient Position: Sitting, Cuff Size: Large)   Pulse 94   Temp 98.8 F (37.1 C) (Oral)   Resp 16   Ht  (1.575 m)   Wt 210 lb (95.3 kg)   SpO2 96%   BMI 38.41 kg/m  Nursing note and vital signs reviewed.  Physical Exam  Constitutional: She is oriented to person, place, and time. She appears well-developed and well-nourished. No distress.  Cardiovascular: Normal rate, regular rhythm, normal heart sounds and intact distal pulses.   Pulmonary/Chest: Effort normal. No respiratory distress. She has wheezes.  Neurological: She is alert and oriented to person, place, and time.  Skin: Skin is warm and dry.  Psychiatric:  She has a normal mood and affect. Her behavior is normal. Judgment and thought content normal.       Assessment & Plan:   Problem List Items Addressed This Visit      Cardiovascular and Mediastinum   Pulmonary hypertension (HCC) - Primary    Appears stable with no symptoms of exacerbation. Remains on oxygen. Not able to use her BiPap secondary to power outage. Appear euvolemic with no symptoms of edema. Continue current dosage of torsemide and pulmonary medications. Changes and follow up per pulmonology.       Relevant Medications   torsemide (DEMADEX) 20 MG tablet   Other Relevant Orders   Basic Metabolic Panel (BMET)    Other Visit Diagnoses    Need for influenza vaccination       Relevant Orders  Flu Vaccine QUAD 36+ mos IM (Completed)       I am having Mary [name] maintain her albuterol, potassium chloride SA, roflumilast, pantoprazole, guaiFENesin, budesonide-formoterol, predniSONE, insulin lispro, insulin glargine, Tiotropium Bromide Monohydrate, VENTOLIN HFA, traMADol, pregabalin, gabapentin, and torsemide.   Meds ordered this encounter  Medications  . torsemide (DEMADEX) 20 MG tablet    Sig: Take 1 tablet (20 mg total) by mouth daily.    Dispense:  90 tablet    Refill:  0     Follow-up: Return in about 6 months (around 03/17/2018), or if symptoms worsen or fail to improve.  Jeanine Luz, FNP

## 2017-09-27 ENCOUNTER — Other Ambulatory Visit: Payer: Self-pay | Admitting: Internal Medicine

## 2017-09-28 ENCOUNTER — Encounter (HOSPITAL_COMMUNITY): Payer: Self-pay | Admitting: *Deleted

## 2017-09-28 ENCOUNTER — Emergency Department (HOSPITAL_COMMUNITY)
Admission: EM | Admit: 2017-09-28 | Discharge: 2017-09-28 | Disposition: A | Discharge status: Home / Self Care | Payer: 59 | Attending: Emergency Medicine | Admitting: Emergency Medicine

## 2017-09-28 ENCOUNTER — Emergency Department (HOSPITAL_COMMUNITY): Payer: 59

## 2017-09-28 DIAGNOSIS — J449 Chronic obstructive pulmonary disease, unspecified: Secondary | ICD-10-CM | POA: Diagnosis not present

## 2017-09-28 DIAGNOSIS — E1140 Type 2 diabetes mellitus with diabetic neuropathy, unspecified: Secondary | ICD-10-CM | POA: Diagnosis not present

## 2017-09-28 DIAGNOSIS — R0602 Shortness of breath: Secondary | ICD-10-CM | POA: Insufficient documentation

## 2017-09-28 DIAGNOSIS — I509 Heart failure, unspecified: Secondary | ICD-10-CM | POA: Diagnosis not present

## 2017-09-28 DIAGNOSIS — R1012 Left upper quadrant pain: Secondary | ICD-10-CM | POA: Insufficient documentation

## 2017-09-28 DIAGNOSIS — Z79899 Other long term (current) drug therapy: Secondary | ICD-10-CM | POA: Insufficient documentation

## 2017-09-28 DIAGNOSIS — Z794 Long term (current) use of insulin: Secondary | ICD-10-CM | POA: Diagnosis not present

## 2017-09-28 DIAGNOSIS — R079 Chest pain, unspecified: Secondary | ICD-10-CM | POA: Diagnosis not present

## 2017-09-28 DIAGNOSIS — F17210 Nicotine dependence, cigarettes, uncomplicated: Secondary | ICD-10-CM | POA: Diagnosis not present

## 2017-09-28 LAB — URINALYSIS, ROUTINE W REFLEX MICROSCOPIC
GLUCOSE, UA: NEGATIVE mg/dL
HGB URINE DIPSTICK: NEGATIVE
KETONES UR: NEGATIVE mg/dL
LEUKOCYTES UA: NEGATIVE
NITRITE: NEGATIVE
PROTEIN: 100 mg/dL — AB
SPECIFIC GRAVITY, URINE: 1.03 (ref 1.005–1.030)
pH: 6 (ref 5.0–8.0)

## 2017-09-28 LAB — COMPREHENSIVE METABOLIC PANEL
ALT: 105 U/L — ABNORMAL HIGH (ref 14–54)
ANION GAP: 12 (ref 5–15)
AST: 212 U/L — ABNORMAL HIGH (ref 15–41)
Albumin: 3.6 g/dL (ref 3.5–5.0)
Alkaline Phosphatase: 153 U/L — ABNORMAL HIGH (ref 38–126)
BILIRUBIN TOTAL: 2 mg/dL — AB (ref 0.3–1.2)
CHLORIDE: 91 mmol/L — AB (ref 101–111)
CO2: 30 mmol/L (ref 22–32)
Calcium: 9.3 mg/dL (ref 8.9–10.3)
Creatinine, Ser: 0.51 mg/dL (ref 0.44–1.00)
Glucose, Bld: 203 mg/dL — ABNORMAL HIGH (ref 65–99)
POTASSIUM: 3.5 mmol/L (ref 3.5–5.1)
Sodium: 133 mmol/L — ABNORMAL LOW (ref 135–145)
TOTAL PROTEIN: 7.4 g/dL (ref 6.5–8.1)

## 2017-09-28 LAB — I-STAT BETA HCG BLOOD, ED (MC, WL, AP ONLY)

## 2017-09-28 LAB — LIPASE, BLOOD: LIPASE: 21 U/L (ref 11–51)

## 2017-09-28 LAB — CBC
HEMATOCRIT: 44.5 % (ref 36.0–46.0)
HEMOGLOBIN: 14.6 g/dL (ref 12.0–15.0)
MCH: 32.7 pg (ref 26.0–34.0)
MCHC: 32.8 g/dL (ref 30.0–36.0)
MCV: 99.6 fL (ref 78.0–100.0)
Platelets: 153 10*3/uL (ref 150–400)
RBC: 4.47 MIL/uL (ref 3.87–5.11)
RDW: 13.5 % (ref 11.5–15.5)
WBC: 9.6 10*3/uL (ref 4.0–10.5)

## 2017-09-28 MED ORDER — KETOROLAC TROMETHAMINE 30 MG/ML IJ SOLN
30.0000 mg | Freq: Once | INTRAMUSCULAR | Status: AC
  Administered 2017-09-28: 30 mg via INTRAMUSCULAR
  Filled 2017-09-28: qty 1

## 2017-09-28 MED ORDER — DICLOFENAC SODIUM 1 % TD GEL: 2.0000 g | Freq: Three times a day (TID) | TRANSDERMAL | 0 refills | Status: DC

## 2017-09-28 NOTE — Discharge Instructions (Signed)
You were seen at [facility] for shortness of breath and left abdominal pain. Your chest x ray was normal and did not show a cause for your shortness of breath. Your blood and urine tests were also normal. We gave you a prescription for Voltaren gel to apply to the left side of your abdomen 3 times a day as needed for pain. We recommend you follow up with your regular doctor for this pain. If the pain continues to worsen or if you start vomiting several times a day and are unable to keep food down please return to the ED.

## 2017-09-28 NOTE — ED Provider Notes (Signed)
[facility] EMERGENCY DEPARTMENT Provider Note   CSN: [id number] Arrival date & time: 09/28/17  0900    History   Chief Complaint Chief Complaint  Patient presents with  . Shortness of Breath  . Abdominal Pain    HPI Mary [name] is a 41 y.o. female with history of CHF, COPD, pulmonary hypertension (mild), cirrhosis, remote hx DVT, alcohol use disorder who presents to the ED with abdominal pain, N/V, and shortness of breath. Patient started to experience left-sided abdominal pain about 3 days ago. States Mary [name] had similar pain in the past and was told Mary [name] had a muscle. Reports nausea for 3 days and NBNB emesis that started this morning. Mary [name] also endorses decreased appetite. Denies changes in bowel movements, hematochezia, melena, and urinary symptoms. Denies history of kidney stones. Denies recent trauma or falls. Mary [name] also reports increasing dyspnea at home with 2 episodes of hypoxia (low 80s). On 3L O2 at home. Denies increased or change in sputum production. Mary [name] is currently on 3 L of oxygen and satting above 95%. On prednisone 5 mg chronically. Denies recent illness, fever, chills, chest pain, headache, and lower extremity swelling.   HPI  Past Medical History:  Diagnosis Date  . Axillary adenopathy    right axillary adenopathy noted on CT chest (03/06/2012)  . Chronic right-sided CHF (congestive heart failure) (HCC) 03/23/2012   with cor pulmonale. Last RHC 03/2012  . COPD (chronic obstructive pulmonary disease) (HCC)    PFTS 03/08/12: fev1 0.58L/1%, FVC 1.18/33%, Ratop 49 and c/w ssevere obstruction. 21% BD response on FVC, RV 219%, DLCO 11/54%  . DVT of upper extremity (deep vein thrombosis) [facility](HCC) April 2013   right subclavian // Unclear precipitating cause - possibly significant right heart failure, with vascular stasis potentially predisposing to clotting.    . Exertional shortness of breath   . Hepatic cirrhosis (HCC)    Questionable history of - Noted on  CT abdomen (03/2012) - thought to be due to vascular congestion from right heart failure +/- patient's history of alcohol abuse  . History of alcoholism (HCC)   . History of chronic bronchitis   . Irregular menses   . Migraine    "~ 1/yr" (05/03/2013)  . Moderate to severe pulmonary hypertension [facility](HCC) April 2013   Cardiac cath on 03/06/12 - 1. Elevated pulmonary artery pressures, right sided filling pressures.,  2. PA: 64/45 (mean 53)    . On home oxygen therapy    "2-3 L 24/7" (05/03/2013)  . OSA (obstructive sleep apnea)    wears noctural BiPAP (05/03/2013)  . Periodontal disease   . Pneumonia ~ 1985; 01/2011  . Pulmonary embolus [facility](HCC) April 2013   Precipitating cause unclear. Was treated with coumadin from April-June 2013.  . Pulmonary hypertension [facility](HCC)     Patient Active Problem List   Diagnosis Date Noted  . Positive D dimer   . Leukocytosis 04/29/2017  . Acute exacerbation of chronic obstructive pulmonary disease (COPD) (HCC) 04/24/2017  . 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 Left ankle sprain 04/21/2017  . Fungemia 02/13/2017  . COPD (chronic obstructive pulmonary disease) (HCC) 02/13/2017  . Chronic respiratory failure with hypoxia and hypercapnia (HCC) 02/13/2017  . Alcohol abuse 01/31/2017  . Tracheomalacia   . Palliative care by specialist   . Anxiety state   . Advance directive declined by patient   . Goals of care, counseling/discussion   . Chronic hoarseness 07/27/2016  . Diabetic neuropathy (HCC) 04/30/2016  . Diabetes mellitus type 2  in obese (HCC) 04/10/2016  . Chronic diastolic heart failure (HCC) 01/25/2016  . PAH (pulmonary artery hypertension) (HCC)   . Constipation 11/18/2015  . Morbid obesity (HCC) 11/18/2015  . Lung nodule 01/30/2015  . Pulmonary mass 01/13/2015  . Gastroparesis   . Esophageal reflux   . Obstipation   . Hypokalemia 02/28/2013  . Financial difficulties 02/28/2013  . Obesity hypoventilation syndrome (HCC) 02/28/2013  .  Myalgia 02/28/2013  . Anovulation 06/30/2012  . Preventative health care 05/05/2012  . Irregular menstrual cycle 04/27/2012  . Chronic right-sided CHF (congestive heart failure) (HCC) 03/23/2012  . Pulmonary hypertension (HCC) 03/23/2012  . Cor pulmonale (HCC) 03/21/2012  . OSA on BiPAP 03/19/2012  . Axillary lymphadenopathy 03/06/2012  . Congenital heart disease 01/28/2012  . Dental caries 01/28/2012    Past Surgical History:  Procedure Laterality Date  . APPENDECTOMY  05/03/2013  . CARDIAC CATHETERIZATION  03/2012  . CARDIAC CATHETERIZATION N/A 01/08/2016   Procedure: Right Heart Cath;  Surgeon: Dolores Patty, MD;  Location: [facility] INVASIVE CV LAB;  Service: Cardiovascular;  Laterality: N/A;  . CARDIAC SURGERY  1977[birth year]   "my heart was backwards" (05/03/2013)  . LAPAROSCOPIC APPENDECTOMY N/A 05/03/2013   Procedure: APPENDECTOMY LAPAROSCOPIC;  Surgeon: Cherylynn Ridges, MD;  Location: [facility] OR;  Service: General;  Laterality: N/A;  . LUNG SURGERY  Feb 17, 1976  . RIGHT HEART CATHETERIZATION N/A 03/07/2012   Procedure: RIGHT HEART CATH;  Surgeon: Kathleene Hazel, MD;  Location: [facility] CATH LAB;  Service: Cardiovascular;  Laterality: N/A;    OB History    No data available       Home Medications    Prior to Admission medications   Medication Sig Start Date End Date Taking? Authorizing Provider  albuterol (PROVENTIL HFA;VENTOLIN HFA) 108 (90 BASE) MCG/ACT inhaler Inhale 2 puffs into the lungs every 6 (six) hours as needed for wheezing or shortness of breath. 09/06/14  Yes Kalman Shan, MD  budesonide-formoterol (SYMBICORT) 160-4.5 MCG/ACT inhaler Inhale 2 puffs into the lungs 2 (two) times daily. Patient taking differently: Inhale 2 puffs into the lungs daily.  03/28/17  Yes Simonne Martinet, NP  gabapentin (NEURONTIN) 300 MG capsule TAKE 2 CAPSULES BY MOUTH 3 TIMES A DAY 09/08/17  Yes Veryl Speak, FNP  guaiFENesin (MUCINEX) 600 MG 12 hr tablet Take 1 tablet (600 mg total) by  mouth 2 (two) times daily. Patient taking differently: Take 600 mg by mouth 2 (two) times daily as needed for cough.  02/11/17  Yes Vassie Loll, MD  Insulin Glargine (BASAGLAR KWIKPEN) 100 UNIT/ML SOPN Inject 25 Units into the skin at bedtime.   Yes [provider]  insulin lispro (HUMALOG) 100 UNIT/ML injection Inject 0-0.15 mLs (0-15 Units total) into the skin 3 (three) times daily with meals. CBG < 70: Eat or drink something right away and recheck. CBG 70 - 120: 0 units CBG 121 - 150: 2 units CBG 151 - 200: 3 units CBG 201 - 250: 5 units CBG 251 - 300: 8 units CBG 301 - 350: 11 units CBG 351 - 400: 15 units CBG > 400: call MD. Patient taking differently: Inject 0-15 Units into the skin as needed. If BS >200 use 8 units  CBG < 70: Eat or drink something right away and recheck. CBG 70 - 120: 0 units CBG 121 - 150: 2 units CBG 151 - 200: 3 units CBG 201 - 250: 5 units CBG 251 - 300: 8 units CBG 301 - 350: 11 units CBG  351 - 400: 15 units CBG > 400: call MD. 03/28/17  Yes Hongalgi, Maximino Greenland, MD  pantoprazole (PROTONIX) 40 MG tablet Take 1 tablet (40 mg total) by mouth 2 (two) times daily before a meal. 07/22/16  Yes Jeralyn Bennett, MD  potassium chloride SA (K-DUR,KLOR-CON) 20 MEQ tablet Take 1 tablet (20 mEq total) by mouth daily. 01/29/16  Yes Bensimhon, Bevelyn Buckles, MD  predniSONE (DELTASONE) 5 MG tablet Resume 5 mg daily after you have completed the 10mg  tab taper Patient taking differently: Take 5 mg by mouth daily.  03/28/17  Yes Simonne Martinet, NP  pregabalin (LYRICA) 50 MG capsule Take 1 capsule (50 mg total) by mouth 3 (three) times daily. Patient taking differently: Take 50 mg by mouth 2 (two) times daily.  08/17/17  Yes Veryl Speak, FNP  roflumilast (DALIRESP) 500 MCG TABS tablet Take 500 mcg by mouth daily.   Yes [provider]  SPIRIVA RESPIMAT 1.25 MCG/ACT AERS INHALE 2 PUFFS INTO THE LUNGS DAILY. 09/27/17  Yes Kalman Shan, MD  torsemide  (DEMADEX) 20 MG tablet Take 1 tablet (20 mg total) by mouth daily. 09/16/17  Yes Veryl Speak, FNP  traMADol (ULTRAM) 50 MG tablet TAKE 1 TABLET BY MOUTH EVERY 6 HOURS AS NEEDED FOR SEVERE PAIN Patient taking differently: Take 50 mg by mouth 3 (three) times daily as needed for moderate pain.  08/17/17  Yes Veryl Speak, FNP  diclofenac sodium (VOLTAREN) 1 % GEL Apply 2 g topically 3 (three) times daily. 09/28/17   Santos-Sanchez, Chelsea Primus, MD  insulin glargine (LANTUS) 100 UNIT/ML injection Inject 0.25 mLs (25 Units total) into the skin daily. Patient not taking: Reported on 09/28/2017 03/29/17   Elease Etienne, MD  VENTOLIN HFA 108 [phone] Base) MCG/ACT inhaler USE 2 PUFFS EVERY 6 HOURS AS NEEDED FOR WHEEZING Patient not taking: Reported on 09/28/2017 05/24/17   Kalman Shan, MD    Family History Family History  Problem Relation Age of Onset  . Hypertension Father   . Heart disease Father        CHF; died age 86   . Alcohol abuse Father   . Other Brother        died age 68 y.o overdose    Social History Social History  Substance Use Topics  . Smoking status: Current Some Day Smoker    Packs/day: 1.00    Years: 25.00    Types: Cigarettes    Last attempt to quit: 02/18/2012  . Smokeless tobacco: Never Used     Comment: smokes 1-2 cigs per day (03/14/17)  . Alcohol use 0.6 oz/week    1 Shots of liquor per week     Comment: doesn't drink anymore 2018     Allergies   Patient has no known allergies.   Review of Systems Review of Systems  Constitutional: Positive for appetite change and fatigue. Negative for activity change, chills, diaphoresis and fever.  Respiratory: Positive for cough, shortness of breath and wheezing. Negative for chest tightness.   Cardiovascular: Negative for chest pain, palpitations and leg swelling.  Gastrointestinal: Positive for abdominal pain. Negative for abdominal distention, blood in stool, constipation, diarrhea, nausea and vomiting.    Genitourinary: Negative for difficulty urinating, dysuria, flank pain, frequency, hematuria and urgency.  All other systems reviewed and are negative.    Physical Exam Updated Vital Signs BP 116/74   Pulse 82   Temp 98.3 F (36.8 C) (Oral)   Resp (!) 22   Ht 5'  2" (1.575 m)   Wt 95.3 kg (210 lb)   LMP 09/14/2017 (Approximate)   SpO2 94%   BMI 38.41 kg/m   Physical Exam  Constitutional: Mary [name] is oriented to person, place, and time. Mary [name] appears well-developed and well-nourished. No distress.  HENT:  Head: Normocephalic and atraumatic.  Nose: Nose normal.  Mouth/Throat: Oropharynx is clear and moist. No oropharyngeal exudate.  Eyes: Pupils are equal, round, and reactive to light. Conjunctivae and EOM are normal.  Neck: Normal range of motion. Neck supple. No JVD present.  Cardiovascular: Normal rate, regular rhythm and normal heart sounds.  Exam reveals no gallop and no friction rub.   No murmur heard. Pulmonary/Chest: Effort normal. No respiratory distress. Mary [name] has wheezes.  Decreased breath sounds throughout and diffuse expiratory wheezes. No increased work of breathing noted and patient able to speak in full sentences.  Abdominal: Soft. Bowel sounds are normal. Mary [name] exhibits no distension. There is tenderness. There is no rebound and no guarding.  There is mild tenderness elevation of left-sided abdomen. No gross abnormalities noted.  Musculoskeletal: Mary [name] exhibits no edema.  Neurological: Mary [name] is alert and oriented to person, place, and time.  Skin: Skin is warm and dry. Capillary refill takes less than 2 seconds. Mary [name] is not diaphoretic.  Nursing note and vitals reviewed.    ED Treatments / Results  Labs (all labs ordered are listed, but only abnormal results are displayed) Labs Reviewed  COMPREHENSIVE METABOLIC PANEL - Abnormal; Notable for the following:       Result Value   Sodium 133 (*)    Chloride 91 (*)    Glucose, Bld 203 (*)    BUN <5 (*)    AST 212 (*)     ALT 105 (*)    Alkaline Phosphatase 153 (*)    Total Bilirubin 2.0 (*)    All other components within normal limits  URINALYSIS, ROUTINE W REFLEX MICROSCOPIC - Abnormal; Notable for the following:    Color, Urine AMBER (*)    APPearance CLOUDY (*)    Bilirubin Urine SMALL (*)    Protein, ur 100 (*)    Bacteria, UA RARE (*)    Squamous Epithelial / LPF TOO NUMEROUS TO COUNT (*)    All other components within normal limits  LIPASE, BLOOD  CBC  I-STAT BETA HCG BLOOD, ED ([facility], [facility], [facility] ONLY)    EKG  EKG Interpretation  Date/Time:  Wednesday September 28 2017 09:11:22 EDT Ventricular Rate:  104 PR Interval:  198 QRS Duration: 78 QT Interval:  342 QTC Calculation: 449 R Axis:   100 Text Interpretation:  Sinus tachycardia Rightward axis Low voltage QRS Non-specific ST-t changes Confirmed by Cathren Laine ([id number]) on 09/28/2017 10:55:50 AM       Radiology Dg Chest 2 View  Result Date: 09/28/2017 CLINICAL DATA:  COPD.  Left chest pain. EXAM: CHEST  2 VIEW COMPARISON:  PA and lateral chest 04/30/2017 08/25/2016. CT chest 04/30/2017. FINDINGS: There is cardiomegaly without edema. No consolidative process, pneumothorax or effusion. No acute bony abnormality. IMPRESSION: Cardiomegaly without acute disease. Electronically Signed   By: Drusilla Kanner M.D.   On: 09/28/2017 10:05    Procedures Procedures (including critical care time)  Medications Ordered in ED Medications  ketorolac (TORADOL) 30 MG/ML injection 30 mg (30 mg Intramuscular Given 09/28/17 1120)     Initial Impression / Assessment and Plan / ED Course  I have reviewed the triage vital signs and the nursing notes.  Pertinent labs & imaging  results that were available during my care of the patient were reviewed by me and considered in my medical decision making (see chart for details).    WILDER AMODEI is a 41 y.o. female with history of CHF, COPD, pulmonary hypertension (mild), cirrhosis, remote hx DVT, alcohol use  disorder who presents to the ED with 2-day history of L sided abdominal pain, N/V, and shortness of breath. States Mary [name] experienced similar pain in the past secondary to muscle strain. Patient looks chronically ill but appears comfortable on exam. Mary [name] is satting above 95% on 3L O2 [place]. Hemodynamically stable since arrival to the ED. Mary [name] has a decreased breath sounds throughout on lung exam and expiratory wheezes. Abdominal exam remarkable for mild tenderness over left side of abdomen. No gross abnormalities noted. Chest x-ray negative for acute processes. UA clean and remainder of blood work unremarkable. Given negative workup and no signs of systemic illness, low suspicion for life-threatening illnesses at this time such as PE, COPD exacerbation, pancreatitis, pyelonephritis. Also, CT abd/pelvis negative in the past in the setting of similar symptoms. Therefore, will not order further imaging at this time.  IM Toradol 30 x1.   Patient states pain resolved with IM Toradol but continues to report pressure. Suspect pain is MSK in nature given improvement with NSAID. Will discharge home with voltaren Gel. Patient advised to apply to affected area 3 times a day. Patient advised to follow-up with a PCP. Return precautions discussed. Patient voiced understanding and is in agreement with plan.   Case discussed with Dr. Denton Lank.   Final Clinical Impressions(s) / ED Diagnoses   Final diagnoses:  Left upper quadrant pain  Shortness of breath    New Prescriptions Discharge Medication List as of 09/28/2017  1:02 PM    START taking these medications   Details  diclofenac sodium (VOLTAREN) 1 % GEL Apply 2 g topically 3 (three) times daily., Starting Wed 09/28/2017, Print         Burna Cash, MD 09/28/17 1527    Cathren Laine, MD 09/28/17 [phone]

## 2017-09-28 NOTE — ED Triage Notes (Signed)
Pt here for L side pain, nausea, emesis x 1 and sob on 3L home O2.

## 2017-10-04 ENCOUNTER — Inpatient Hospital Stay: Payer: 59 | Admitting: Nurse Practitioner

## 2017-10-05 ENCOUNTER — Ambulatory Visit: Payer: 59 | Admitting: Nurse Practitioner

## 2017-10-09 DIAGNOSIS — I270 Primary pulmonary hypertension: Secondary | ICD-10-CM | POA: Diagnosis not present

## 2017-10-09 DIAGNOSIS — G4733 Obstructive sleep apnea (adult) (pediatric): Secondary | ICD-10-CM | POA: Diagnosis not present

## 2017-10-09 DIAGNOSIS — J449 Chronic obstructive pulmonary disease, unspecified: Secondary | ICD-10-CM | POA: Diagnosis not present

## 2017-10-11 DIAGNOSIS — I270 Primary pulmonary hypertension: Secondary | ICD-10-CM | POA: Diagnosis not present

## 2017-10-11 DIAGNOSIS — J449 Chronic obstructive pulmonary disease, unspecified: Secondary | ICD-10-CM | POA: Diagnosis not present

## 2017-10-11 DIAGNOSIS — G4733 Obstructive sleep apnea (adult) (pediatric): Secondary | ICD-10-CM | POA: Diagnosis not present

## 2017-10-13 DIAGNOSIS — Z6837 Body mass index (BMI) 37.0-37.9, adult: Secondary | ICD-10-CM | POA: Diagnosis not present

## 2017-10-13 DIAGNOSIS — Z86718 Personal history of other venous thrombosis and embolism: Secondary | ICD-10-CM

## 2017-10-13 DIAGNOSIS — T380X5S Adverse effect of glucocorticoids and synthetic analogues, sequela: Secondary | ICD-10-CM | POA: Diagnosis not present

## 2017-10-13 DIAGNOSIS — J9622 Acute and chronic respiratory failure with hypercapnia: Secondary | ICD-10-CM | POA: Diagnosis not present

## 2017-10-13 DIAGNOSIS — I2729 Other secondary pulmonary hypertension: Secondary | ICD-10-CM | POA: Diagnosis not present

## 2017-10-13 DIAGNOSIS — J9621 Acute and chronic respiratory failure with hypoxia: Secondary | ICD-10-CM | POA: Diagnosis not present

## 2017-10-13 DIAGNOSIS — Z86711 Personal history of pulmonary embolism: Secondary | ICD-10-CM

## 2017-10-13 DIAGNOSIS — J441 Chronic obstructive pulmonary disease with (acute) exacerbation: Secondary | ICD-10-CM | POA: Diagnosis not present

## 2017-10-13 DIAGNOSIS — F17210 Nicotine dependence, cigarettes, uncomplicated: Secondary | ICD-10-CM | POA: Diagnosis not present

## 2017-10-13 DIAGNOSIS — E099 Drug or chemical induced diabetes mellitus without complications: Secondary | ICD-10-CM | POA: Diagnosis not present

## 2017-10-13 DIAGNOSIS — I50812 Chronic right heart failure: Secondary | ICD-10-CM | POA: Diagnosis not present

## 2017-10-13 DIAGNOSIS — Z79891 Long term (current) use of opiate analgesic: Secondary | ICD-10-CM

## 2017-10-13 DIAGNOSIS — Z9981 Dependence on supplemental oxygen: Secondary | ICD-10-CM

## 2017-10-13 DIAGNOSIS — I5032 Chronic diastolic (congestive) heart failure: Secondary | ICD-10-CM | POA: Diagnosis not present

## 2017-10-13 DIAGNOSIS — F1020 Alcohol dependence, uncomplicated: Secondary | ICD-10-CM | POA: Diagnosis not present

## 2017-10-13 DIAGNOSIS — E662 Morbid (severe) obesity with alveolar hypoventilation: Secondary | ICD-10-CM

## 2017-10-17 ENCOUNTER — Telehealth: Payer: Self-pay

## 2017-10-25 NOTE — Telephone Encounter (Signed)
Error

## 2017-10-26 ENCOUNTER — Telehealth: Payer: Self-pay | Admitting: Family

## 2017-10-26 MED ORDER — PREGABALIN 50 MG PO CAPS: 50.0000 mg | ORAL_CAPSULE | Freq: Two times a day (BID) | ORAL | 0 refills | Status: DC

## 2017-10-26 MED ORDER — TRAMADOL HCL 50 MG PO TABS
ORAL_TABLET | ORAL | 0 refills | Status: DC
Start: 2017-10-26 — End: 2017-10-31

## 2017-10-26 NOTE — Telephone Encounter (Signed)
Pt called requesting a refill on traMADol (ULTRAM) 50 MG tablet sent to [facility] in [place].

## 2017-10-26 NOTE — Telephone Encounter (Signed)
[facility]  Patient has also made appt for hospital fu with [facility] for next week.  Patient is requesting med for the weekend. Patient is also requesting refill on lyrica.

## 2017-10-26 NOTE — Telephone Encounter (Signed)
Tried to call pt. No answer and voicemail box is not set up.

## 2017-10-26 NOTE — Telephone Encounter (Signed)
Pt needs to establish with PCP before refills are sent.

## 2017-10-26 NOTE — Telephone Encounter (Signed)
Please advise if you are ok with refilling pts lyrica and tramadol. Last refilled both on 08/18/17 per [place] for 30 day supply.

## 2017-10-31 MED ORDER — PREGABALIN 50 MG PO CAPS: 50.0000 mg | ORAL_CAPSULE | Freq: Two times a day (BID) | ORAL | 3 refills | Status: AC

## 2017-10-31 MED ORDER — TRAMADOL HCL 50 MG PO TABS: ORAL_TABLET | ORAL | 3 refills | Status: DC

## 2017-10-31 NOTE — Telephone Encounter (Signed)
Spoke with pt and advised of the response below. She was upset stating that she felt that she was being punished for Tammy SoursGreg leaving and she needs the lyrica and tramadol for pain. I advised that I would talk with Plotnikov or his assistant to see if he would not mind giving her a longer supply until she sees Ashleigh since she has an appointment for a hospital follow up on 11/01/17.

## 2017-10-31 NOTE — Telephone Encounter (Signed)
Ok - I'll fill her Rx Thx

## 2017-10-31 NOTE — Addendum Note (Signed)
Addended by: Tresa GarterPLOTNIKOV, ALEKSEI V on: 10/31/2017 05:00 PM   Modules accepted: Orders

## 2017-11-01 ENCOUNTER — Encounter: Payer: Self-pay | Admitting: Internal Medicine

## 2017-11-01 ENCOUNTER — Ambulatory Visit (INDEPENDENT_AMBULATORY_CARE_PROVIDER_SITE_OTHER): Payer: 59 | Admitting: Internal Medicine

## 2017-11-01 DIAGNOSIS — R109 Unspecified abdominal pain: Secondary | ICD-10-CM | POA: Diagnosis not present

## 2017-11-01 DIAGNOSIS — R10A2 Flank pain, left side: Secondary | ICD-10-CM | POA: Insufficient documentation

## 2017-11-01 DIAGNOSIS — E1142 Type 2 diabetes mellitus with diabetic polyneuropathy: Secondary | ICD-10-CM

## 2017-11-01 DIAGNOSIS — E669 Obesity, unspecified: Secondary | ICD-10-CM | POA: Diagnosis not present

## 2017-11-01 DIAGNOSIS — E1169 Type 2 diabetes mellitus with other specified complication: Secondary | ICD-10-CM

## 2017-11-01 NOTE — Assessment & Plan Note (Signed)
Basaglar, Humalog

## 2017-11-01 NOTE — Assessment & Plan Note (Addendum)
Lyrica, Gabapentin, Tramadol Lyrica or Gabapentin alone don't work per pt

## 2017-11-01 NOTE — Assessment & Plan Note (Addendum)
L flank and LUQ pain off and on - better than before. CXR and labs were OK.  Abd US or CT if not better

## 2017-11-01 NOTE — Progress Notes (Signed)
Subjective:  Patient ID: Mary [name], female    DOB: 1976/09/20  Age: 41 y.o. MRN: [id number]  CC: No chief complaint on file.   HPI Mary [name] presents for ER f/u visit. F/u on L flank and LUQ pain off and on - better than before. CXR and labs were OK. F/uLBP and neuropathy  Outpatient Medications Prior to Visit  Medication Sig Dispense Refill  . albuterol (PROVENTIL HFA;VENTOLIN HFA) 108 (90 BASE) MCG/ACT inhaler Inhale 2 puffs into the lungs every 6 (six) hours as needed for wheezing or shortness of breath. 8.5 g 1  . budesonide-formoterol (SYMBICORT) 160-4.5 MCG/ACT inhaler Inhale 2 puffs into the lungs 2 (two) times daily. (Patient taking differently: Inhale 2 puffs into the lungs daily. ) 1 Inhaler 12  . diclofenac sodium (VOLTAREN) 1 % GEL Apply 2 g topically 3 (three) times daily. 1 Tube 0  . gabapentin (NEURONTIN) 300 MG capsule TAKE 2 CAPSULES BY MOUTH 3 TIMES A DAY 180 capsule 0  . guaiFENesin (MUCINEX) 600 MG 12 hr tablet Take 1 tablet (600 mg total) by mouth 2 (two) times daily. (Patient taking differently: Take 600 mg by mouth 2 (two) times daily as needed for cough. ) 40 tablet 0  . Insulin Glargine (BASAGLAR KWIKPEN) 100 UNIT/ML SOPN Inject 25 Units into the skin at bedtime.    . insulin glargine (LANTUS) 100 UNIT/ML injection Inject 0.25 mLs (25 Units total) into the skin daily.    . insulin lispro (HUMALOG) 100 UNIT/ML injection Inject 0-0.15 mLs (0-15 Units total) into the skin 3 (three) times daily with meals. CBG < 70: Eat or drink something right away and recheck. CBG 70 - 120: 0 units CBG 121 - 150: 2 units CBG 151 - 200: 3 units CBG 201 - 250: 5 units CBG 251 - 300: 8 units CBG 301 - 350: 11 units CBG 351 - 400: 15 units CBG > 400: call MD. (Patient taking differently: Inject 0-15 Units into the skin as needed. If BS >200 use 8 units  CBG < 70: Eat or drink something right away and recheck. CBG 70 - 120: 0 units CBG 121 - 150: 2 units CBG 151 -  200: 3 units CBG 201 - 250: 5 units CBG 251 - 300: 8 units CBG 301 - 350: 11 units CBG 351 - 400: 15 units CBG > 400: call MD.)    . pantoprazole (PROTONIX) 40 MG tablet Take 1 tablet (40 mg total) by mouth 2 (two) times daily before a meal. 60 tablet 0  . potassium chloride SA (K-DUR,KLOR-CON) 20 MEQ tablet Take 1 tablet (20 mEq total) by mouth daily. 30 tablet 6  . predniSONE (DELTASONE) 5 MG tablet Resume 5 mg daily after you have completed the 10mg  tab taper (Patient taking differently: Take 5 mg by mouth daily. )    . pregabalin (LYRICA) 50 MG capsule Take 1 capsule (50 mg total) by mouth 2 (two) times daily. 60 capsule 3  . roflumilast (DALIRESP) 500 MCG TABS tablet Take 500 mcg by mouth daily.    Marland Kitchen SPIRIVA RESPIMAT 1.25 MCG/ACT AERS INHALE 2 PUFFS INTO THE LUNGS DAILY. 1 Inhaler 5  . torsemide (DEMADEX) 20 MG tablet Take 1 tablet (20 mg total) by mouth daily. 90 tablet 0  . traMADol (ULTRAM) 50 MG tablet TAKE 1 TABLET BY MOUTH EVERY 8 HOURS AS NEEDED FOR SEVERE PAIN 30 tablet 3  . VENTOLIN HFA 108 (90 Base) MCG/ACT inhaler USE 2 PUFFS EVERY 6  HOURS AS NEEDED FOR WHEEZING 18 Inhaler 5   No facility-administered medications prior to visit.     ROS Review of Systems  Constitutional: Negative for activity change, appetite change, chills, fatigue and unexpected weight change.  HENT: Negative for congestion, mouth sores and sinus pressure.   Eyes: Negative for visual disturbance.  Respiratory: Negative for cough and chest tightness.   Gastrointestinal: Positive for abdominal pain. Negative for nausea.  Genitourinary: Negative for difficulty urinating, frequency and vaginal pain.  Musculoskeletal: Negative for back pain and gait problem.  Skin: Negative for pallor and rash.  Neurological: Negative for dizziness, tremors, weakness, numbness and headaches.  Psychiatric/Behavioral: Negative for confusion and sleep disturbance.   O2 is on  Objective:  BP 118/72 (BP Location: Right Arm,  Patient Position: Sitting, Cuff Size: Large)   Pulse 86   Temp 98.1 F (36.7 C) (Oral)   Ht 5\' 2"  (1.575 m)   Wt 212 lb (96.2 kg)   SpO2 98% Comment: 3 liters of o2  BMI 38.78 kg/m   BP Readings from Last 3 Encounters:  11/01/17 118/72  09/28/17 116/74  09/16/17 118/62    Wt Readings from Last 3 Encounters:  11/01/17 212 lb (96.2 kg)  09/28/17 210 lb (95.3 kg)  09/16/17 210 lb (95.3 kg)    Physical Exam  Constitutional: She appears well-developed. No distress.  HENT:  Head: Normocephalic.  Right Ear: External ear normal.  Left Ear: External ear normal.  Nose: Nose normal.  Mouth/Throat: Oropharynx is clear and moist.  Eyes: Conjunctivae are normal. Pupils are equal, round, and reactive to light. Right eye exhibits no discharge. Left eye exhibits no discharge.  Neck: Normal range of motion. Neck supple. No JVD present. No tracheal deviation present. No thyromegaly present.  Cardiovascular: Normal rate, regular rhythm and normal heart sounds.  Pulmonary/Chest: No stridor. No respiratory distress. She has no wheezes.  Abdominal: Soft. Bowel sounds are normal. She exhibits no distension and no mass. There is no tenderness. There is no rebound and no guarding.  Musculoskeletal: She exhibits no edema or tenderness.  Lymphadenopathy:    She has no cervical adenopathy.  Neurological: She displays normal reflexes. No cranial nerve deficit. She exhibits normal muscle tone. Coordination normal.  Skin: No rash noted. No erythema.  Psychiatric: She has a normal mood and affect. Her behavior is normal. Judgment and thought content normal.  LUQ is sensitive Large abdomen No mass   Lab Results  Component Value Date   WBC 9.6 09/28/2017   HGB 14.6 09/28/2017   HCT 44.5 09/28/2017   PLT 153 09/28/2017   GLUCOSE 203 (H) 09/28/2017   CHOL 190 03/01/2013   TRIG 151 (H) 03/01/2013   HDL 49 03/01/2013   LDLCALC 111 (H) 03/01/2013   ALT 105 (H) 09/28/2017   AST 212 (H) 09/28/2017    NA 133 (L) 09/28/2017   K 3.5 09/28/2017   CL 91 (L) 09/28/2017   CREATININE 0.51 09/28/2017   BUN <5 (L) 09/28/2017   CO2 30 09/28/2017   TSH 0.561 02/01/2017   INR 0.92 02/09/2017   HGBA1C 6.8 (H) 04/24/2017    Dg Chest 2 View  Result Date: 09/28/2017 CLINICAL DATA:  COPD.  Left chest pain. EXAM: CHEST  2 VIEW COMPARISON:  PA and lateral chest 04/30/2017 08/25/2016. CT chest 04/30/2017. FINDINGS: There is cardiomegaly without edema. No consolidative process, pneumothorax or effusion. No acute bony abnormality. IMPRESSION: Cardiomegaly without acute disease. Electronically Signed   By: Drusilla Kannerhomas  Dalessio M.D.  On: 09/28/2017 10:05    Assessment & Plan:   There are no diagnoses linked to this encounter. I am having Jana HakimMichelle L. Keegan maintain her albuterol, potassium chloride SA, roflumilast, pantoprazole, guaiFENesin, budesonide-formoterol, predniSONE, insulin lispro, insulin glargine, VENTOLIN HFA, gabapentin, torsemide, SPIRIVA RESPIMAT, BASAGLAR KWIKPEN, diclofenac sodium, traMADol, and pregabalin.  No orders of the defined types were placed in this encounter.    Follow-up: No Follow-up on file.  Sonda PrimesAlex Lewanda Perea, MD

## 2017-11-01 NOTE — Assessment & Plan Note (Signed)
Wt Readings from Last 3 Encounters:  11/01/17 212 lb (96.2 kg)  09/28/17 210 lb (95.3 kg)  09/16/17 210 lb (95.3 kg)

## 2017-11-08 DIAGNOSIS — G4733 Obstructive sleep apnea (adult) (pediatric): Secondary | ICD-10-CM | POA: Diagnosis not present

## 2017-11-08 DIAGNOSIS — J449 Chronic obstructive pulmonary disease, unspecified: Secondary | ICD-10-CM | POA: Diagnosis not present

## 2017-11-08 DIAGNOSIS — I270 Primary pulmonary hypertension: Secondary | ICD-10-CM | POA: Diagnosis not present

## 2017-11-10 DIAGNOSIS — I270 Primary pulmonary hypertension: Secondary | ICD-10-CM | POA: Diagnosis not present

## 2017-11-10 DIAGNOSIS — G4733 Obstructive sleep apnea (adult) (pediatric): Secondary | ICD-10-CM | POA: Diagnosis not present

## 2017-11-10 DIAGNOSIS — J449 Chronic obstructive pulmonary disease, unspecified: Secondary | ICD-10-CM | POA: Diagnosis not present

## 2017-12-08 ENCOUNTER — Other Ambulatory Visit: Payer: Self-pay

## 2017-12-08 MED ORDER — TORSEMIDE 20 MG PO TABS: 20.0000 mg | ORAL_TABLET | Freq: Every day | ORAL | 0 refills | Status: DC

## 2017-12-09 DIAGNOSIS — I270 Primary pulmonary hypertension: Secondary | ICD-10-CM | POA: Diagnosis not present

## 2017-12-09 DIAGNOSIS — G4733 Obstructive sleep apnea (adult) (pediatric): Secondary | ICD-10-CM | POA: Diagnosis not present

## 2017-12-09 DIAGNOSIS — J449 Chronic obstructive pulmonary disease, unspecified: Secondary | ICD-10-CM | POA: Diagnosis not present

## 2017-12-11 DIAGNOSIS — I270 Primary pulmonary hypertension: Secondary | ICD-10-CM | POA: Diagnosis not present

## 2017-12-11 DIAGNOSIS — G4733 Obstructive sleep apnea (adult) (pediatric): Secondary | ICD-10-CM | POA: Diagnosis not present

## 2017-12-11 DIAGNOSIS — J449 Chronic obstructive pulmonary disease, unspecified: Secondary | ICD-10-CM | POA: Diagnosis not present

## 2017-12-13 ENCOUNTER — Other Ambulatory Visit: Payer: Self-pay | Admitting: Internal Medicine

## 2018-01-09 ENCOUNTER — Other Ambulatory Visit: Payer: Self-pay

## 2018-01-09 DIAGNOSIS — J449 Chronic obstructive pulmonary disease, unspecified: Secondary | ICD-10-CM | POA: Diagnosis not present

## 2018-01-09 DIAGNOSIS — E1142 Type 2 diabetes mellitus with diabetic polyneuropathy: Secondary | ICD-10-CM

## 2018-01-09 DIAGNOSIS — I270 Primary pulmonary hypertension: Secondary | ICD-10-CM | POA: Diagnosis not present

## 2018-01-09 DIAGNOSIS — G4733 Obstructive sleep apnea (adult) (pediatric): Secondary | ICD-10-CM | POA: Diagnosis not present

## 2018-01-09 MED ORDER — GABAPENTIN 300 MG PO CAPS: 600.0000 mg | ORAL_CAPSULE | Freq: Three times a day (TID) | ORAL | 0 refills | Status: DC

## 2018-01-11 ENCOUNTER — Encounter (HOSPITAL_COMMUNITY): Payer: Self-pay | Admitting: Emergency Medicine

## 2018-01-11 ENCOUNTER — Other Ambulatory Visit: Payer: Self-pay

## 2018-01-11 ENCOUNTER — Emergency Department (HOSPITAL_COMMUNITY): Payer: 59

## 2018-01-11 ENCOUNTER — Inpatient Hospital Stay (HOSPITAL_COMMUNITY)
Admission: EM | Admit: 2018-01-11 | Discharge: 2018-01-14 | DRG: 190 | Disposition: A | Discharge status: Home / Self Care | Payer: 59 | Attending: Internal Medicine | Admitting: Internal Medicine

## 2018-01-11 DIAGNOSIS — I5032 Chronic diastolic (congestive) heart failure: Secondary | ICD-10-CM | POA: Diagnosis present

## 2018-01-11 DIAGNOSIS — J449 Chronic obstructive pulmonary disease, unspecified: Secondary | ICD-10-CM | POA: Diagnosis not present

## 2018-01-11 DIAGNOSIS — G4733 Obstructive sleep apnea (adult) (pediatric): Secondary | ICD-10-CM | POA: Diagnosis not present

## 2018-01-11 DIAGNOSIS — I2729 Other secondary pulmonary hypertension: Secondary | ICD-10-CM | POA: Diagnosis present

## 2018-01-11 DIAGNOSIS — Z8249 Family history of ischemic heart disease and other diseases of the circulatory system: Secondary | ICD-10-CM

## 2018-01-11 DIAGNOSIS — E1169 Type 2 diabetes mellitus with other specified complication: Secondary | ICD-10-CM | POA: Diagnosis present

## 2018-01-11 DIAGNOSIS — Z86718 Personal history of other venous thrombosis and embolism: Secondary | ICD-10-CM | POA: Diagnosis not present

## 2018-01-11 DIAGNOSIS — Z79891 Long term (current) use of opiate analgesic: Secondary | ICD-10-CM | POA: Diagnosis not present

## 2018-01-11 DIAGNOSIS — Z794 Long term (current) use of insulin: Secondary | ICD-10-CM

## 2018-01-11 DIAGNOSIS — Z7951 Long term (current) use of inhaled steroids: Secondary | ICD-10-CM

## 2018-01-11 DIAGNOSIS — J9622 Acute and chronic respiratory failure with hypercapnia: Secondary | ICD-10-CM | POA: Diagnosis present

## 2018-01-11 DIAGNOSIS — E1142 Type 2 diabetes mellitus with diabetic polyneuropathy: Secondary | ICD-10-CM | POA: Diagnosis present

## 2018-01-11 DIAGNOSIS — E1140 Type 2 diabetes mellitus with diabetic neuropathy, unspecified: Secondary | ICD-10-CM | POA: Diagnosis present

## 2018-01-11 DIAGNOSIS — D696 Thrombocytopenia, unspecified: Secondary | ICD-10-CM | POA: Diagnosis present

## 2018-01-11 DIAGNOSIS — E669 Obesity, unspecified: Secondary | ICD-10-CM | POA: Diagnosis not present

## 2018-01-11 DIAGNOSIS — Z716 Tobacco abuse counseling: Secondary | ICD-10-CM

## 2018-01-11 DIAGNOSIS — Z79899 Other long term (current) drug therapy: Secondary | ICD-10-CM | POA: Diagnosis not present

## 2018-01-11 DIAGNOSIS — Z6838 Body mass index (BMI) 38.0-38.9, adult: Secondary | ICD-10-CM | POA: Diagnosis not present

## 2018-01-11 DIAGNOSIS — R0602 Shortness of breath: Secondary | ICD-10-CM | POA: Diagnosis not present

## 2018-01-11 DIAGNOSIS — Z86711 Personal history of pulmonary embolism: Secondary | ICD-10-CM

## 2018-01-11 DIAGNOSIS — I270 Primary pulmonary hypertension: Secondary | ICD-10-CM | POA: Diagnosis not present

## 2018-01-11 DIAGNOSIS — Z9981 Dependence on supplemental oxygen: Secondary | ICD-10-CM | POA: Diagnosis not present

## 2018-01-11 DIAGNOSIS — Z811 Family history of alcohol abuse and dependence: Secondary | ICD-10-CM | POA: Diagnosis not present

## 2018-01-11 DIAGNOSIS — J441 Chronic obstructive pulmonary disease with (acute) exacerbation: Secondary | ICD-10-CM | POA: Diagnosis not present

## 2018-01-11 DIAGNOSIS — F17210 Nicotine dependence, cigarettes, uncomplicated: Secondary | ICD-10-CM | POA: Diagnosis present

## 2018-01-11 DIAGNOSIS — J9621 Acute and chronic respiratory failure with hypoxia: Secondary | ICD-10-CM | POA: Diagnosis not present

## 2018-01-11 LAB — GLUCOSE, CAPILLARY
GLUCOSE-CAPILLARY: 318 mg/dL — AB (ref 65–99)
Glucose-Capillary: 271 mg/dL — ABNORMAL HIGH (ref 65–99)
Glucose-Capillary: 389 mg/dL — ABNORMAL HIGH (ref 65–99)

## 2018-01-11 LAB — BLOOD GAS, ARTERIAL
Acid-Base Excess: 4.9 mmol/L — ABNORMAL HIGH (ref 0.0–2.0)
Bicarbonate: 31.9 mmol/L — ABNORMAL HIGH (ref 20.0–28.0)
DRAWN BY: 103701
O2 CONTENT: 4 L/min
O2 Saturation: 93.4 %
PO2 ART: 72.2 mmHg — AB (ref 83.0–108.0)
Patient temperature: 98.6
pCO2 arterial: 59.9 mmHg — ABNORMAL HIGH (ref 32.0–48.0)
pH, Arterial: 7.346 — ABNORMAL LOW (ref 7.350–7.450)

## 2018-01-11 LAB — BASIC METABOLIC PANEL
Anion gap: 11 (ref 5–15)
BUN: 7 mg/dL (ref 6–20)
CALCIUM: 9.1 mg/dL (ref 8.9–10.3)
CO2: 31 mmol/L (ref 22–32)
CREATININE: 0.59 mg/dL (ref 0.44–1.00)
Chloride: 94 mmol/L — ABNORMAL LOW (ref 101–111)
GFR calc non Af Amer: >60 mL/min (ref >60)
Glucose, Bld: 178 mg/dL — ABNORMAL HIGH (ref 65–99)
Potassium: 4.1 mmol/L (ref 3.5–5.1)
SODIUM: 136 mmol/L (ref 135–145)

## 2018-01-11 LAB — CBC WITH DIFFERENTIAL/PLATELET
BASOS PCT: 0 %
Basophils Absolute: 0 10*3/uL (ref 0.0–0.1)
EOS ABS: 0.1 10*3/uL (ref 0.0–0.7)
Eosinophils Relative: 2 %
HCT: 40.2 % (ref 36.0–46.0)
Hemoglobin: 13.4 g/dL (ref 12.0–15.0)
Lymphocytes Relative: 26 %
Lymphs Abs: 1.7 10*3/uL (ref 0.7–4.0)
MCH: 34.5 pg — AB (ref 26.0–34.0)
MCHC: 33.3 g/dL (ref 30.0–36.0)
MCV: 103.6 fL — ABNORMAL HIGH (ref 78.0–100.0)
MONO ABS: 0.5 10*3/uL (ref 0.1–1.0)
MONOS PCT: 8 %
Neutro Abs: 4.3 10*3/uL (ref 1.7–7.7)
Neutrophils Relative %: 64 %
Platelets: 121 10*3/uL — ABNORMAL LOW (ref 150–400)
RBC: 3.88 MIL/uL (ref 3.87–5.11)
RDW: 13.8 % (ref 11.5–15.5)
WBC: 6.7 10*3/uL (ref 4.0–10.5)

## 2018-01-11 LAB — INFLUENZA PANEL BY PCR (TYPE A & B)
INFLBPCR: NEGATIVE
Influenza A By PCR: NEGATIVE

## 2018-01-11 LAB — I-STAT TROPONIN, ED: Troponin i, poc: 0 ng/mL (ref 0.00–0.08)

## 2018-01-11 LAB — BRAIN NATRIURETIC PEPTIDE: B Natriuretic Peptide: 45.9 pg/mL (ref 0.0–100.0)

## 2018-01-11 MED ORDER — ONDANSETRON HCL 4 MG/2ML IJ SOLN: 4.0000 mg | Freq: Four times a day (QID) | INTRAMUSCULAR | Status: DC | PRN

## 2018-01-11 MED ORDER — ALBUTEROL SULFATE (2.5 MG/3ML) 0.083% IN NEBU: 2.5000 mg | INHALATION_SOLUTION | RESPIRATORY_TRACT | Status: DC | PRN

## 2018-01-11 MED ORDER — INSULIN ASPART 100 UNIT/ML ~~LOC~~ SOLN
0.0000 [IU] | Freq: Three times a day (TID) | SUBCUTANEOUS | Status: DC
  Administered 2018-01-11: 11 [IU] via SUBCUTANEOUS
  Administered 2018-01-11: 8 [IU] via SUBCUTANEOUS
  Administered 2018-01-12: 11 [IU] via SUBCUTANEOUS
  Administered 2018-01-12 (×2): 15 [IU] via SUBCUTANEOUS

## 2018-01-11 MED ORDER — ONDANSETRON HCL 4 MG PO TABS: 4.0000 mg | ORAL_TABLET | Freq: Four times a day (QID) | ORAL | Status: DC | PRN

## 2018-01-11 MED ORDER — INSULIN GLARGINE 100 UNIT/ML ~~LOC~~ SOLN
10.0000 [IU] | Freq: Every day | SUBCUTANEOUS | Status: DC
  Administered 2018-01-11 – 2018-01-12 (×2): 10 [IU] via SUBCUTANEOUS
  Filled 2018-01-11 (×2): qty 0.1

## 2018-01-11 MED ORDER — SODIUM CHLORIDE 0.9% FLUSH: 3.0000 mL | INTRAVENOUS | Status: DC | PRN

## 2018-01-11 MED ORDER — INSULIN ASPART 100 UNIT/ML ~~LOC~~ SOLN
0.0000 [IU] | Freq: Every day | SUBCUTANEOUS | Status: DC
  Administered 2018-01-11: 5 [IU] via SUBCUTANEOUS

## 2018-01-11 MED ORDER — ACETAMINOPHEN 650 MG RE SUPP: 650.0000 mg | Freq: Four times a day (QID) | RECTAL | Status: DC | PRN

## 2018-01-11 MED ORDER — ENOXAPARIN SODIUM 40 MG/0.4ML ~~LOC~~ SOLN
40.0000 mg | SUBCUTANEOUS | Status: DC
  Administered 2018-01-11 – 2018-01-13 (×3): 40 mg via SUBCUTANEOUS
  Filled 2018-01-11 (×3): qty 0.4

## 2018-01-11 MED ORDER — IPRATROPIUM-ALBUTEROL 0.5-2.5 (3) MG/3ML IN SOLN
3.0000 mL | Freq: Three times a day (TID) | RESPIRATORY_TRACT | Status: DC
  Administered 2018-01-11 – 2018-01-14 (×8): 3 mL via RESPIRATORY_TRACT
  Filled 2018-01-11 (×8): qty 3

## 2018-01-11 MED ORDER — NICOTINE 14 MG/24HR TD PT24: 14.0000 mg | MEDICATED_PATCH | Freq: Every day | TRANSDERMAL | Status: DC | PRN

## 2018-01-11 MED ORDER — ALBUTEROL (5 MG/ML) CONTINUOUS INHALATION SOLN
10.0000 mg/h | INHALATION_SOLUTION | Freq: Once | RESPIRATORY_TRACT | Status: AC
  Administered 2018-01-11: 10 mg/h via RESPIRATORY_TRACT
  Filled 2018-01-11: qty 20

## 2018-01-11 MED ORDER — ACETAMINOPHEN 325 MG PO TABS: 650.0000 mg | ORAL_TABLET | Freq: Four times a day (QID) | ORAL | Status: DC | PRN

## 2018-01-11 MED ORDER — ROFLUMILAST 500 MCG PO TABS
500.0000 ug | ORAL_TABLET | Freq: Every day | ORAL | Status: DC
  Administered 2018-01-11 – 2018-01-14 (×4): 500 ug via ORAL
  Filled 2018-01-11 (×4): qty 1

## 2018-01-11 MED ORDER — GUAIFENESIN ER 600 MG PO TB12: 600.0000 mg | ORAL_TABLET | Freq: Two times a day (BID) | ORAL | Status: DC | PRN

## 2018-01-11 MED ORDER — MOMETASONE FURO-FORMOTEROL FUM 200-5 MCG/ACT IN AERO
2.0000 | INHALATION_SPRAY | Freq: Two times a day (BID) | RESPIRATORY_TRACT | Status: DC
  Administered 2018-01-11 – 2018-01-14 (×6): 2 via RESPIRATORY_TRACT
  Filled 2018-01-11: qty 8.8

## 2018-01-11 MED ORDER — SODIUM CHLORIDE 0.9% FLUSH
3.0000 mL | Freq: Two times a day (BID) | INTRAVENOUS | Status: DC
  Administered 2018-01-11 – 2018-01-13 (×6): 3 mL via INTRAVENOUS

## 2018-01-11 MED ORDER — PREGABALIN 50 MG PO CAPS
50.0000 mg | ORAL_CAPSULE | Freq: Two times a day (BID) | ORAL | Status: DC
  Administered 2018-01-11 – 2018-01-14 (×7): 50 mg via ORAL
  Filled 2018-01-11 (×7): qty 1

## 2018-01-11 MED ORDER — TRAMADOL HCL 50 MG PO TABS
50.0000 mg | ORAL_TABLET | Freq: Four times a day (QID) | ORAL | Status: DC | PRN
  Administered 2018-01-11 – 2018-01-14 (×6): 50 mg via ORAL
  Filled 2018-01-11 (×7): qty 1

## 2018-01-11 MED ORDER — SODIUM CHLORIDE 0.9 % IV SOLN: 250.0000 mL | INTRAVENOUS | Status: DC | PRN

## 2018-01-11 MED ORDER — TORSEMIDE 20 MG PO TABS
20.0000 mg | ORAL_TABLET | Freq: Every day | ORAL | Status: DC
  Administered 2018-01-11 – 2018-01-13 (×3): 20 mg via ORAL
  Filled 2018-01-11 (×4): qty 1

## 2018-01-11 MED ORDER — DOXYCYCLINE HYCLATE 100 MG IV SOLR
100.0000 mg | Freq: Once | INTRAVENOUS | Status: AC
  Administered 2018-01-11: 100 mg via INTRAVENOUS
  Filled 2018-01-11: qty 100

## 2018-01-11 MED ORDER — DOXYCYCLINE HYCLATE 100 MG PO TABS
100.0000 mg | ORAL_TABLET | Freq: Two times a day (BID) | ORAL | Status: DC
  Administered 2018-01-11 – 2018-01-14 (×6): 100 mg via ORAL
  Filled 2018-01-11 (×7): qty 1

## 2018-01-11 MED ORDER — GABAPENTIN 300 MG PO CAPS
600.0000 mg | ORAL_CAPSULE | Freq: Three times a day (TID) | ORAL | Status: DC
  Administered 2018-01-11 – 2018-01-14 (×10): 600 mg via ORAL
  Filled 2018-01-11 (×10): qty 2

## 2018-01-11 MED ORDER — METHYLPREDNISOLONE SODIUM SUCC 125 MG IJ SOLR
60.0000 mg | Freq: Three times a day (TID) | INTRAMUSCULAR | Status: DC
  Administered 2018-01-11 – 2018-01-13 (×6): 60 mg via INTRAVENOUS
  Filled 2018-01-11 (×7): qty 2

## 2018-01-11 NOTE — ED Notes (Signed)
Note: Doxycycline infusion resumed with IV start.

## 2018-01-11 NOTE — ED Notes (Signed)
Bed: RESA Expected date:  Expected time:  Means of arrival:  Comments: Short of breath 

## 2018-01-11 NOTE — ED Provider Notes (Signed)
[facility]-EMERGENCY DEPT Provider Note   CSN: [id number] Arrival date & time: 01/11/18  4098     History   Chief Complaint Chief Complaint  Patient presents with  . Respiratory Distress    HPI Mary [name] is a 42 y.o. female.  HPI  42 year old female with history of VT, COPD alcoholism, CHF comes in with chief complaint of shortness of breath. Patient states that over the past 2 days she has been having worsening dyspnea.  Initially the shortness of breath was with exertion, however last night she started having shortness of breath even at rest.  Patient is on constant 2 L of oxygen at home, now she is requiring higher amount of oxygen to keep her O2 sats over 80%.   Patient states that she has been having cough, body aches, chills and subjective fevers.  Patient also has history of CHF, but she denies any orthopnea or paroxysmal nocturnal dyspnea or any new leg swelling.    Past Medical History:  Diagnosis Date  . Axillary adenopathy    right axillary adenopathy noted on CT chest (03/06/2012)  . Chronic right-sided CHF (congestive heart failure) (HCC) 03/23/2012   with cor pulmonale. Last RHC 03/2012  . COPD (chronic obstructive pulmonary disease) (HCC)    PFTS 03/08/12: fev1 0.58L/1%, FVC 1.18/33%, Ratop 49 and c/w ssevere obstruction. 21% BD response on FVC, RV 219%, DLCO 11/54%  . DVT of upper extremity (deep vein thrombosis) [facility]) April 2013   right subclavian // Unclear precipitating cause - possibly significant right heart failure, with vascular stasis potentially predisposing to clotting.    . Exertional shortness of breath   . Hepatic cirrhosis (HCC)    Questionable history of - Noted on CT abdomen (03/2012) - thought to be due to vascular congestion from right heart failure +/- patient's history of alcohol abuse  . History of alcoholism (HCC)   . History of chronic bronchitis   . Irregular menses   . Migraine    "~ 1/yr" (05/03/2013)  .  Moderate to severe pulmonary hypertension [facility]) April 2013   Cardiac cath on 03/06/12 - 1. Elevated pulmonary artery pressures, right sided filling pressures.,  2. PA: 64/45 (mean 53)    . On home oxygen therapy    "2-3 L 24/7" (05/03/2013)  . OSA (obstructive sleep apnea)    wears noctural BiPAP (05/03/2013)  . Periodontal disease   . Pneumonia ~ 1985; 01/2011  . Pulmonary embolus [facility]) April 2013   Precipitating cause unclear. Was treated with coumadin from April-June 2013.  . Pulmonary hypertension [facility])     Patient Active Problem List   Diagnosis Date Noted  . Left flank pain 11/01/2017  . Positive D dimer   . Leukocytosis 04/29/2017  . Acute exacerbation of chronic obstructive pulmonary disease (COPD) (HCC) 04/24/2017  . 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 Left ankle sprain 04/21/2017  . Fungemia 02/13/2017  . COPD (chronic obstructive pulmonary disease) (HCC) 02/13/2017  . Chronic respiratory failure with hypoxia and hypercapnia (HCC) 02/13/2017  . Alcohol abuse 01/31/2017  . Tracheomalacia   . Palliative care by specialist   . Anxiety state   . Advance directive declined by patient   . Goals of care, counseling/discussion   . Chronic hoarseness 07/27/2016  . Diabetic neuropathy (HCC) 04/30/2016  . Diabetes mellitus type 2 in obese (HCC) 04/10/2016  . Chronic diastolic heart failure (HCC) 01/25/2016  . PAH (pulmonary artery hypertension) (HCC)   . Constipation 11/18/2015  . Morbid  obesity (HCC) 11/18/2015  . Lung nodule 01/30/2015  . Pulmonary mass 01/13/2015  . Gastroparesis   . Esophageal reflux   . Obstipation   . Hypokalemia 02/28/2013  . Financial difficulties 02/28/2013  . Obesity hypoventilation syndrome (HCC) 02/28/2013  . Myalgia 02/28/2013  . Anovulation 06/30/2012  . Preventative health care 05/05/2012  . Irregular menstrual cycle 04/27/2012  . Chronic right-sided CHF (congestive heart failure) (HCC) 03/23/2012  . Pulmonary hypertension (HCC)  03/23/2012  . Cor pulmonale (HCC) 03/21/2012  . OSA on BiPAP 03/19/2012  . Axillary lymphadenopathy 03/06/2012  . Congenital heart disease 01/28/2012  . Dental caries 01/28/2012    Past Surgical History:  Procedure Laterality Date  . APPENDECTOMY  05/03/2013  . CARDIAC CATHETERIZATION  03/2012  . CARDIAC CATHETERIZATION N/A 01/08/2016   Procedure: Right Heart Cath;  Surgeon: Dolores Patty, MD;  Location: [facility] INVASIVE CV LAB;  Service: Cardiovascular;  Laterality: N/A;  . CARDIAC SURGERY  05-20-1977   "my heart was backwards" (05/03/2013)  . LAPAROSCOPIC APPENDECTOMY N/A 05/03/2013   Procedure: APPENDECTOMY LAPAROSCOPIC;  Surgeon: Cherylynn Ridges, MD;  Location: [facility] OR;  Service: General;  Laterality: N/A;  . LUNG SURGERY  1976-09-08  . RIGHT HEART CATHETERIZATION N/A 03/07/2012   Procedure: RIGHT HEART CATH;  Surgeon: Kathleene Hazel, MD;  Location: [facility] CATH LAB;  Service: Cardiovascular;  Laterality: N/A;    OB History    No data available       Home Medications    Prior to Admission medications   Medication Sig Start Date End Date Taking? Authorizing Provider  albuterol (PROVENTIL HFA;VENTOLIN HFA) 108 (90 BASE) MCG/ACT inhaler Inhale 2 puffs into the lungs every 6 (six) hours as needed for wheezing or shortness of breath. 09/06/14  Yes Kalman Shan, MD  budesonide-formoterol (SYMBICORT) 160-4.5 MCG/ACT inhaler Inhale 2 puffs into the lungs 2 (two) times daily. Patient taking differently: Inhale 2 puffs into the lungs daily.  03/28/17  Yes Simonne Martinet, NP  gabapentin (NEURONTIN) 300 MG capsule Take 2 capsules (600 mg total) by mouth 3 (three) times daily. 01/09/18  Yes Evaristo Bury, NP  guaiFENesin (MUCINEX) 600 MG 12 hr tablet Take 1 tablet (600 mg total) by mouth 2 (two) times daily. Patient taking differently: Take 600 mg by mouth 2 (two) times daily as needed for cough.  02/11/17  Yes Vassie Loll, MD  Insulin Glargine (BASAGLAR KWIKPEN) 100 UNIT/ML SOPN  Inject 25 Units into the skin daily as needed (BS).    Yes [provider]  potassium chloride SA (K-DUR,KLOR-CON) 20 MEQ tablet Take 1 tablet (20 mEq total) by mouth daily. 01/29/16  Yes Bensimhon, Bevelyn Buckles, MD  pregabalin (LYRICA) 50 MG capsule Take 1 capsule (50 mg total) by mouth 2 (two) times daily. 10/31/17  Yes Plotnikov, Georgina Quint, MD  roflumilast (DALIRESP) 500 MCG TABS tablet Take 500 mcg by mouth daily.   Yes [provider]  SPIRIVA RESPIMAT 1.25 MCG/ACT AERS INHALE 2 PUFFS INTO THE LUNGS DAILY. 09/27/17  Yes Kalman Shan, MD  torsemide (DEMADEX) 20 MG tablet Take 1 tablet (20 mg total) by mouth daily. 12/08/17  Yes Shambley, Audie Box, NP  traMADol (ULTRAM) 50 MG tablet TAKE 1 TABLET BY MOUTH EVERY 8 HOURS AS NEEDED FOR SEVERE PAIN 10/31/17  Yes Plotnikov, Georgina Quint, MD  albuterol (VENTOLIN HFA) 108 (90 Base) MCG/ACT inhaler USE 2 PUFFS EVERY 6 HOURS AS NEEDED FOR WHEEZING Patient not taking: Reported on 01/11/2018 12/13/17   Kalman Shan, MD  diclofenac sodium (VOLTAREN) 1 % GEL Apply 2 g topically 3 (three) times daily. Patient not taking: Reported on 01/11/2018 09/28/17   Burna CashSantos-Sanchez, Idalys, MD  insulin glargine (LANTUS) 100 UNIT/ML injection Inject 0.25 mLs (25 Units total) into the skin daily. 03/29/17   Hongalgi, Maximino GreenlandAnand D, MD  insulin lispro (HUMALOG) 100 UNIT/ML injection Inject 0-0.15 mLs (0-15 Units total) into the skin 3 (three) times daily with meals. CBG < 70: Eat or drink something right away and recheck. CBG 70 - 120: 0 units CBG 121 - 150: 2 units CBG 151 - 200: 3 units CBG 201 - 250: 5 units CBG 251 - 300: 8 units CBG 301 - 350: 11 units CBG 351 - 400: 15 units CBG > 400: call MD. Patient taking differently: Inject 0-15 Units into the skin as needed. If BS >200 use 8 units  CBG < 70: Eat or drink something right away and recheck. CBG 70 - 120: 0 units CBG 121 - 150: 2 units CBG 151 - 200: 3 units CBG 201 - 250: 5 units CBG 251 - 300: 8  units CBG 301 - 350: 11 units CBG 351 - 400: 15 units CBG > 400: call MD. 03/28/17   Elease EtienneHongalgi, Anand D, MD  pantoprazole (PROTONIX) 40 MG tablet Take 1 tablet (40 mg total) by mouth 2 (two) times daily before a meal. Patient not taking: Reported on 01/11/2018 07/22/16   Jeralyn BennettZamora, Ezequiel, MD  predniSONE (DELTASONE) 5 MG tablet Resume 5 mg daily after you have completed the 10mg  tab taper Patient not taking: Reported on 01/11/2018 03/28/17   Simonne MartinetBabcock, Peter E, NP    Family History Family History  Problem Relation Age of Onset  . Hypertension Father   . Heart disease Father        CHF; died age 10158   . Alcohol abuse Father   . Other Brother        died age 41 y.o overdose    Social History Social History   Tobacco Use  . Smoking status: Current Some Day Smoker    Packs/day: 1.00    Years: 25.00    Pack years: 25.00    Types: Cigarettes    Last attempt to quit: 02/18/2012    Years since quitting: 5.9  . Smokeless tobacco: Never Used  . Tobacco comment: smokes 1-2 cigs per day (03/14/17)  Substance Use Topics  . Alcohol use: Yes    Alcohol/week: 0.6 oz    Types: 1 Shots of liquor per week    Comment: doesn't drink anymore 2018  . Drug use: No     Allergies   Patient has no known allergies.   Review of Systems Review of Systems  All other systems reviewed and are negative.    Physical Exam Updated Vital Signs BP 114/68 (BP Location: Right Arm)   Pulse (!) 102   Temp 98.3 F (36.8 C) (Oral)   Resp (!) 27   SpO2 93%   Physical Exam  Constitutional: She is oriented to person, place, and time. She appears well-developed.  HENT:  Head: Normocephalic and atraumatic.  Eyes: EOM are normal.  Neck: Normal range of motion. Neck supple.  Cardiovascular: Normal rate.  Pulmonary/Chest: Effort normal. She has wheezes.  Abdominal: Soft. Bowel sounds are normal. There is no tenderness.  Musculoskeletal: She exhibits no edema or tenderness.  Neurological: She is alert and  oriented to person, place, and time.  Skin: Skin is warm and dry.  Nursing note and vitals  reviewed.    ED Treatments / Results  Labs (all labs ordered are listed, but only abnormal results are displayed) Labs Reviewed  CBC WITH DIFFERENTIAL/PLATELET - Abnormal; Notable for the following components:      Result Value   MCV 103.6 (*)    MCH 34.5 (*)    Platelets 121 (*)    All other components within normal limits  BASIC METABOLIC PANEL - Abnormal; Notable for the following components:   Chloride 94 (*)    Glucose, Bld 178 (*)    All other components within normal limits  BRAIN NATRIURETIC PEPTIDE  INFLUENZA PANEL BY PCR (TYPE A & B)  I-STAT TROPONIN, ED    EKG  EKG Interpretation  Date/Time:  Wednesday January 11 2018 05:29:43 EST Ventricular Rate:  109 PR Interval:    QRS Duration: 96 QT Interval:  347 QTC Calculation: 468 R Axis:   99 Text Interpretation:  Sinus tachycardia Biatrial enlargement Probable lateral infarct, old No acute changes Confirmed by Derwood Kaplan [phone]) on 01/11/2018 6:14:16 AM       Radiology No results found.  Procedures Procedures (including critical care time)  CRITICAL CARE Performed by: Hershey Knauer   Total critical care time:  33 minutes for COPD exacerbation with acute on chronic hypoxia requiring multiple assessment.  Critical care time was exclusive of separately billable procedures and treating other patients.  Critical care was necessary to treat or prevent imminent or life-threatening deterioration.  Critical care was time spent personally by me on the following activities: development of treatment plan with patient and/or surrogate as well as nursing, discussions with consultants, evaluation of patient's response to treatment, examination of patient, obtaining history from patient or surrogate, ordering and performing treatments and interventions, ordering and review of laboratory studies, ordering and review of  radiographic studies, pulse oximetry and re-evaluation of patient's condition.   Medications Ordered in ED Medications  albuterol (PROVENTIL,VENTOLIN) solution continuous neb (not administered)  doxycycline (VIBRAMYCIN) 100 mg in dextrose 5 % 250 mL IVPB (100 mg Intravenous New Bag/Given 01/11/18 0721)     Initial Impression / Assessment and Plan / ED Course  I have reviewed the triage vital signs and the nursing notes.  Pertinent labs & imaging results that were available during my care of the patient were reviewed by me and considered in my medical decision making (see chart for details).     42 year old female comes in with chief complaint of shortness of breath.  She has multiple medical comorbidities, and has severe respiratory illness at baseline.  She is requiring more oxygen to keep her O2 sats close to the low 90s now.  Patient is noted to have diffuse wheezing on exam, and she is also complaining of viral illness/flulike illness.  Patient has history of CHF, however the exam is not consistent with pulmonary edema.  Plan is to get influenza RPR, chest x-ray.  If the flu is negative, and patient continues to stay hypoxic admitting team might consider CT PE -as patient does have a history of DVTs.  Final Clinical Impressions(s) / ED Diagnoses   Final diagnoses:  COPD with acute exacerbation (HCC)  Acute on chronic respiratory failure with hypoxia [facility])    ED Discharge Orders    None       Derwood Kaplan, MD 01/11/18 [phone]

## 2018-01-11 NOTE — Progress Notes (Signed)
Nutrition Brief Note  Consult received per COPD Gold Protocol.   Wt Readings from Last 15 Encounters:  11/01/17 212 lb (96.2 kg)  09/28/17 210 lb (95.3 kg)  09/16/17 210 lb (95.3 kg)  05/06/17 203 lb (92.1 kg)  05/06/17 203 lb (92.1 kg)  05/03/17 203 lb 8 oz (92.3 kg)  04/26/17 208 lb 4.8 oz (94.5 kg)  04/21/17 202 lb (91.6 kg)  04/19/17 203 lb (92.1 kg)  03/28/17 200 lb 11.2 oz (91 kg)  03/14/17 203 lb 6.4 oz (92.3 kg)  02/15/17 209 lb 3.5 oz (94.9 kg)  02/11/17 205 lb 0.4 oz (93 kg)  02/04/17 204 lb (92.5 kg)  01/15/17 201 lb 6.4 oz (91.4 kg)   Using weight from 11/01/17, BMI: 38.8 kg/m2. Patient meets criteria for obesity based on current BMI. Skin WDL. Pt with PMH of COPD on 3L O2 at home and nocturnal BiPAP for OSA/OHS, CHF, DVT, and Type 2 DM with neuropathy. She presented to the ED with 2-3 days of worsening dyspnea with nonproductive cough.   Current diet order is Carb Modified.  Medications reviewed. Labs reviewed; Cl: 94 mmol/L.  No nutrition interventions warranted at this time. If nutrition issues arise, please re-consult RD.      Trenton GammonJessica Delinda Malan, MS, RD, LDN, [facility] Inpatient Clinical Dietitian Pager # [phone] After hours/weekend pager # [phone]

## 2018-01-11 NOTE — ED Triage Notes (Signed)
Pt arriving from home with shortness of breath x2 days progressively getting worse. Pts home SpO2 monitor was reading in the 60's and slowly increased to low 80's. Pt arriving with breathing treatment. Pt wears O2 at home 3.5L. Pt had 2 duo treatments at home prior to EMS arrival. Lungs diminished in all areas. 0.5 atrovent, duo neb, and  125mg  solumedrol given by EMS. 126/71 HR 110 99% (While receiving breathing tx)

## 2018-01-11 NOTE — ED Notes (Signed)
ED TO INPATIENT HANDOFF REPORT  Name/Age/Gender Mary [name] 42 y.o. female  Code Status Code Status History    Date Active Date Inactive Code Status Order ID Comments User Context   04/29/2017 22:14 05/03/2017 13:37 Full Code [id number]  Vianne Bulls, MD ED   04/24/2017 11:49 04/27/2017 22:52 Full Code [id number]  Lady Deutscher, MD ED   03/24/2017 17:45 03/28/2017 20:34 Full Code [id number]  Modena Jansky, MD ED   02/13/2017 17:07 02/15/2017 21:24 Full Code [id number]  Modena Jansky, MD Inpatient   02/09/2017 08:20 02/11/2017 19:54 Full Code [id number]  Modena Jansky, MD Inpatient   01/31/2017 20:36 02/04/2017 18:42 Full Code [id number]  Toy Baker, MD Inpatient   01/11/2017 10:57 01/15/2017 18:03 Full Code [id number]  Waldemar Dickens, MD ED   08/25/2016 13:04 08/28/2016 14:31 Full Code [id number]  Waldemar Dickens, MD ED   08/09/2016 18:48 08/13/2016 21:27 Full Code [id number]  Waldemar Dickens, MD Inpatient   07/18/2016 07:57 07/22/2016 20:00 Full Code [id number]  Rondel Jumbo, PA-C ED   04/01/2016 23:08 04/11/2016 19:32 Full Code [id number]  Norval Morton, MD ED   12/16/2015 18:23 12/19/2015 21:09 Full Code [id number]  Elmarie Shiley, MD Inpatient   11/14/2015 02:32 11/17/2015 14:26 Full Code [id number]  Allyne Gee, MD Inpatient   01/13/2015 04:07 01/15/2015 20:52 Full Code [id number]  Etta Quill, DO ED   12/14/2014 04:13 12/18/2014 16:08 Full Code [id number]  Rise Patience, MD Inpatient   05/03/2013 09:35 05/04/2013 15:24 Full Code [id number]  Gwenyth Ober, MD Inpatient   02/28/2013 10:38 03/02/2013 19:19 Full Code [id number]  Annamarie Dawley, DO Inpatient   02/28/2013 08:05 02/28/2013 10:38 Full Code [id number]  Annamarie Dawley, DO ED   03/06/2012 13:10 03/11/2012 18:24 Full Code [id number]  Irish Elders, RN Inpatient      Home/SNF/Other Home  Chief Complaint Respiratory Distress  Level of Care/Admitting Diagnosis ED Disposition    ED Disposition  Condition [facility]: [place] [100102]  Level of Care: Med-Surg [16]  Diagnosis: COPD with acute exacerbation [facility]) [[id number]]  Admitting Physician: Patrecia Pour [phone]  Attending Physician: Patrecia Pour [phone]  Estimated length of stay: past midnight tomorrow  Certification:: I certify this patient will need inpatient services for at least 2 midnights  PT Class (Do Not Modify): Inpatient [101]  PT Acc Code (Do Not Modify): Private [1]       Medical History Past Medical History:  Diagnosis Date  . Axillary adenopathy    right axillary adenopathy noted on CT chest (03/06/2012)  . Chronic right-sided CHF (congestive heart failure) ([place]) 03/23/2012   with cor pulmonale. Last RHC 03/2012  . COPD (chronic obstructive pulmonary disease) ([place])    PFTS 03/08/12: fev1 0.58L/1%, FVC 1.18/33%, Ratop 49 and c/w ssevere obstruction. 21% BD response on FVC, RV 219%, DLCO 11/54%  . DVT of upper extremity (deep vein thrombosis) [facility]) April 2013   right subclavian // Unclear precipitating cause - possibly significant right heart failure, with vascular stasis potentially predisposing to clotting.    . Exertional shortness of breath   . Hepatic cirrhosis ([place])    Questionable history of - Noted on CT abdomen (03/2012) - thought to be due to vascular congestion from right heart failure +/- patient's history of alcohol abuse  . History of alcoholism ([place])   . History of chronic bronchitis   . Irregular menses   .  Migraine    "~ 1/yr" (05/03/2013)  . Moderate to severe pulmonary hypertension [facility]) April 2013   Cardiac cath on 03/06/12 - 1. Elevated pulmonary artery pressures, right sided filling pressures.,  2. PA: 64/45 (mean 53)    . On home oxygen therapy    "2-3 L 24/7" (05/03/2013)  . OSA (obstructive sleep apnea)    wears noctural BiPAP (05/03/2013)  . Periodontal disease   . Pneumonia ~ 1985; 01/2011  . Pulmonary embolus [facility]) April 2013   Precipitating  cause unclear. Was treated with coumadin from April-June 2013.  . Pulmonary hypertension ([place])     Allergies No Known Allergies  IV Location/Drains/Wounds Patient Lines/Drains/Airways Status   Active Line/Drains/Airways    Name:   Placement date:   Placement time:   Site:   Days:   Peripheral IV 01/11/18 Right;Lateral;Distal Forearm   01/11/18    0912    Forearm   less than 1          Labs/Imaging Results for orders placed or performed during the hospital encounter of 01/11/18 (from the past 48 hour(s))  CBC with Differential     Status: Abnormal   Collection Time: 01/11/18  5:26 AM  Result Value Ref Range   WBC 6.7 4.0 - 10.5 K/uL   RBC 3.88 3.87 - 5.11 MIL/uL   Hemoglobin 13.4 12.0 - 15.0 g/dL   HCT 40.2 36.0 - 46.0 %   MCV 103.6 (H) 78.0 - 100.0 fL   MCH 34.5 (H) 26.0 - 34.0 pg   MCHC 33.3 30.0 - 36.0 g/dL   RDW 13.8 11.5 - 15.5 %   Platelets 121 (L) 150 - 400 K/uL   Neutrophils Relative % 64 %   Neutro Abs 4.3 1.7 - 7.7 K/uL   Lymphocytes Relative 26 %   Lymphs Abs 1.7 0.7 - 4.0 K/uL   Monocytes Relative 8 %   Monocytes Absolute 0.5 0.1 - 1.0 K/uL   Eosinophils Relative 2 %   Eosinophils Absolute 0.1 0.0 - 0.7 K/uL   Basophils Relative 0 %   Basophils Absolute 0.0 0.0 - 0.1 K/uL    Comment: Performed at [facility], [place] [address], [place], [place] [address]  Basic metabolic panel     Status: Abnormal   Collection Time: 01/11/18  5:26 AM  Result Value Ref Range   Sodium 136 135 - 145 mmol/L   Potassium 4.1 3.5 - 5.1 mmol/L   Chloride 94 (L) 101 - 111 mmol/L   CO2 31 22 - 32 mmol/L   Glucose, Bld 178 (H) 65 - 99 mg/dL   BUN 7 6 - 20 mg/dL   Creatinine, Ser 0.59 0.44 - 1.00 mg/dL   Calcium 9.1 8.9 - 10.3 mg/dL   GFR calc non Af Amer >60 >60 mL/min   GFR calc Af Amer >60 >60 mL/min    Comment: (NOTE) The eGFR has been calculated using the CKD EPI equation. This calculation has not been validated in all clinical situations. eGFR's  persistently <60 mL/min signify possible Chronic Kidney Disease.    Anion gap 11 5 - 15    Comment: Performed at [facility], [place] [address], [place], [place] [address]  Brain natriuretic peptide     Status: None   Collection Time: 01/11/18  5:26 AM  Result Value Ref Range   B Natriuretic Peptide 45.9 0.0 - 100.0 pg/mL    Comment: Performed at [facility], [place] [address] [place], [place]  [id number]  I-stat troponin, ED     Status: None   Collection Time: 01/11/18  7:30 AM  Result Value Ref Range   Troponin i, poc 0.00 0.00 - 0.08 ng/mL   Comment 3            Comment: Due to the release kinetics of cTnI, a negative result within the first hours of the onset of symptoms does not rule out myocardial infarction with certainty. If myocardial infarction is still suspected, repeat the test at appropriate intervals.   Blood gas, arterial ([facility] & [facility] ONLY)     Status: Abnormal   Collection Time: 01/11/18  8:35 AM  Result Value Ref Range   O2 Content 4.0 L/min   Delivery systems NASAL CANNULA    pH, Arterial 7.346 (L) 7.350 - 7.450   pCO2 arterial 59.9 (H) 32.0 - 48.0 mmHg   pO2, Arterial 72.2 (L) 83.0 - 108.0 mmHg   Bicarbonate 31.9 (H) 20.0 - 28.0 mmol/L   Acid-Base Excess 4.9 (H) 0.0 - 2.0 mmol/L   O2 Saturation 93.4 %   Patient temperature 98.6    Collection site BRACHIAL ARTERY    Drawn by [id number]    Sample type ARTHROGRAPHIS SPECIES     Comment: Performed at [facility], [place] [address], [place], [place] [address]   Dg Chest 2 View  Result Date: 01/11/2018 CLINICAL DATA:  Shortness of Breath EXAM: CHEST  2 VIEW COMPARISON:  September 28, 2017 FINDINGS: There is no edema or consolidation. The heart size is slightly enlarged with pulmonary vascularity within normal limits. No adenopathy. No bone lesions. IMPRESSION: Slight cardiac enlargement, essentially stable. No edema or consolidation. Electronically Signed   By: Lowella Grip III M.D.   On: 01/11/2018 07:27    Pending Labs Unresulted Labs (From admission, onward)   Start     Ordered   01/11/18 0614  Influenza panel by PCR (type A & B)  (Influenza PCR Panel)  Once,   R     01/11/18 7939   Signed and Held  CBC  (enoxaparin (LOVENOX)    CrCl >/= 30 ml/min)  Once,   R    Comments:  Baseline for enoxaparin therapy IF NOT ALREADY DRAWN.  Notify MD if PLT < 100 K.    Signed and Held   Signed and Held  Creatinine, serum  (enoxaparin (LOVENOX)    CrCl >/= 30 ml/min)  Once,   R    Comments:  Baseline for enoxaparin therapy IF NOT ALREADY DRAWN.    Signed and Held   Signed and Held  Creatinine, serum  (enoxaparin (LOVENOX)    CrCl >/= 30 ml/min)  Weekly,   R    Comments:  while on enoxaparin therapy    Signed and Held   Signed and Held  Basic metabolic panel  Tomorrow morning,   R     Signed and Held   Signed and Held  CBC  Tomorrow morning,   R     Signed and Held      Vitals/Pain Today's Vitals   01/11/18 0636 01/11/18 0855 01/11/18 0900 01/11/18 1000  BP: 114/68 110/72 118/82 132/90  Pulse: (!) 102 93 88 (!) 109  Resp: (!) 27 20 (!) 24 17  Temp:      TempSrc:      SpO2: 93% 98% 98%     Isolation Precautions Droplet precaution  Medications Medications  albuterol (PROVENTIL,VENTOLIN) solution continuous neb (10 mg/hr Nebulization Given 01/11/18 0834)  doxycycline (VIBRAMYCIN) 100 mg in dextrose 5 % 250 mL IVPB (0 mg Intravenous Stopped 01/11/18 0956)    Mobility manual wheelchair

## 2018-01-11 NOTE — Progress Notes (Signed)
CSW received consult for COPD Gold Protocol, though patient does not meet qualifications (only 1 admission within the past 6 months).  CSW signing off.   Mialee Weyman, LCSWA Clinical Social Worker [facility] Cell#: [phone]  

## 2018-01-11 NOTE — H&P (Signed)
History and Physical   Mary [name]:[id number] DOB: 05/28/1976 DOA: 01/11/2018  Referring MD/NP/PA: Rhunette Croft, EDP PCP: Veryl Speak, FNP Outpatient Specialists: Pulmonology, Vassie Loll.  Patient coming from: Home  Chief Complaint: Dyspnea  HPI: Mary [name] is a 42 y.o. female with a history of COPD on 3L/m O2 and OSA/OHS on nocturnal BiPAP, formerly steroid-dependent, chronic diastolic CHF, history of DVT no longer on anticoagulation, and T2DM with neuropathy who presented to the ED for 2-3 days of worsening dyspnea with associated nonproductive cough. Dyspnea came on gradually without fever or chills. No sick contacts, but she's been sitting on her porch more with the nicer weather, and has had environmental allergies precipitate exacerbations in the past, most recently hospitalized May 2018. The morning of presentation she reported dyspnea that was at rest, only somewhat improved with breathing traetments and SpO2 in 60%'s, so she increased her oxygen flow. She felt shaky, which is typical for her symptoms during exacerbation, and came to the hospital. On arrival she was afebrile with sinus tachycardia, tachypnea, saturating >90% on increased O2 at 4L. WBC 6.7, platelets 121. CXR showed no consolidation, mild cardiomegaly without pulmonary edema. Troponin was negative and ECG showed no acute ischemic changes. She did not appear volume overloaded, BNP 45, tropnin negative. Nebulizers, increased oxygen, and doxycycline started and hospitalists called to admit.   Review of Systems: Denies fever, chills (of note, she did report this to EDP), weight loss, changes in vision or hearing, headache, chest pain, palpitations, abdominal pain, nausea, vomiting, changes in bowel habits, blood in stool, change in bladder habits, myalgias, arthralgias, and rash, and per HPI. All others reviewed and are negative.   Past Medical History:  Diagnosis Date  . Axillary adenopathy    right axillary  adenopathy noted on CT chest (03/06/2012)  . Chronic right-sided CHF (congestive heart failure) ([facility]) 03/23/2012   with cor pulmonale. Last RHC 03/2012  . COPD (chronic obstructive pulmonary disease) ([facility])    PFTS 03/08/12: fev1 0.58L/1%, FVC 1.18/33%, Ratop 49 and c/w ssevere obstruction. 21% BD response on FVC, RV 219%, DLCO 11/54%  . DVT of upper extremity (deep vein thrombosis) [facility]) April 2013   right subclavian // Unclear precipitating cause - possibly significant right heart failure, with vascular stasis potentially predisposing to clotting.    . Exertional shortness of breath   . Hepatic cirrhosis ([facility])    Questionable history of - Noted on CT abdomen (03/2012) - thought to be due to vascular congestion from right heart failure +/- patient's history of alcohol abuse  . History of alcoholism ([facility])   . History of chronic bronchitis   . Irregular menses   . Migraine    "~ 1/yr" (05/03/2013)  . Moderate to severe pulmonary hypertension [facility]) April 2013   Cardiac cath on 03/06/12 - 1. Elevated pulmonary artery pressures, right sided filling pressures.,  2. PA: 64/45 (mean 53)    . On home oxygen therapy    "2-3 L 24/7" (05/03/2013)  . OSA (obstructive sleep apnea)    wears noctural BiPAP (05/03/2013)  . Periodontal disease   . Pneumonia ~ 1985; 01/2011  . Pulmonary embolus [facility]) April 2013   Precipitating cause unclear. Was treated with coumadin from April-June 2013.  . Pulmonary hypertension ([facility])    Past Surgical History:  Procedure Laterality Date  . APPENDECTOMY  05/03/2013  . CARDIAC CATHETERIZATION  03/2012  . CARDIAC CATHETERIZATION N/A 01/08/2016   Procedure: Right Heart Cath;  Surgeon: Dolores Patty, MD;  Location: [facility] INVASIVE CV LAB;  Service: Cardiovascular;  Laterality: N/A;  . CARDIAC SURGERY  10/16/1977   "my heart was backwards" (05/03/2013)  . LAPAROSCOPIC APPENDECTOMY N/A 05/03/2013   Procedure: APPENDECTOMY LAPAROSCOPIC;  Surgeon: Cherylynn RidgesJames O Wyatt, MD;  [facility];   Service: General;  Laterality: N/A;  . LUNG SURGERY  10/16/1977  . RIGHT HEART CATHETERIZATION N/A 03/07/2012   Procedure: RIGHT HEART CATH;  Surgeon: Kathleene Hazelhristopher D McAlhany, MD;  [facility];  Service: Cardiovascular;  Laterality: N/A;   - Intermittent smoker. Quit for 5 years at one point. Was told by hospice that she would continue declining so she "figured, oh well, might as well start smoking then" and smokes < 1 ppd now. No EtOH, lives at home with husband.   reports that she has been smoking cigarettes.  She has a 25.00 pack-year smoking history. she has never used smokeless tobacco. She reports that she drinks about 0.6 oz of alcohol per week. She reports that she does not use drugs. No Known Allergies Family History  Problem Relation Age of Onset  . Hypertension Father   . Heart disease Father        CHF; died age 41   . Alcohol abuse Father   . Other Brother        died age 721 y.o overdose   - Family history otherwise reviewed and not pertinent.  Prior to Admission medications   Medication Sig Start Date End Date Taking? Authorizing Provider  albuterol (PROVENTIL HFA;VENTOLIN HFA) 108 (90 BASE) MCG/ACT inhaler Inhale 2 puffs into the lungs every 6 (six) hours as needed for wheezing or shortness of breath. 09/06/14  Yes Kalman Shanamaswamy, Murali, MD  budesonide-formoterol (SYMBICORT) 160-4.5 MCG/ACT inhaler Inhale 2 puffs into the lungs 2 (two) times daily. Patient taking differently: Inhale 2 puffs into the lungs daily.  03/28/17  Yes Simonne MartinetBabcock, Peter E, NP  gabapentin (NEURONTIN) 300 MG capsule Take 2 capsules (600 mg total) by mouth 3 (three) times daily. 01/09/18  Yes Evaristo BuryShambley, Ashleigh N, NP  guaiFENesin (MUCINEX) 600 MG 12 hr tablet Take 1 tablet (600 mg total) by mouth 2 (two) times daily. Patient taking differently: Take 600 mg by mouth 2 (two) times daily as needed for cough.  02/11/17  Yes Vassie LollMadera, Carlos, MD  Insulin Glargine (BASAGLAR KWIKPEN) 100 UNIT/ML SOPN Inject 25 Units  into the skin daily as needed (BS).    Yes [provider]  potassium chloride SA (K-DUR,KLOR-CON) 20 MEQ tablet Take 1 tablet (20 mEq total) by mouth daily. 01/29/16  Yes Bensimhon, Bevelyn Bucklesaniel R, MD  pregabalin (LYRICA) 50 MG capsule Take 1 capsule (50 mg total) by mouth 2 (two) times daily. 10/31/17  Yes Plotnikov, Georgina QuintAleksei V, MD  roflumilast (DALIRESP) 500 MCG TABS tablet Take 500 mcg by mouth daily.   Yes [provider]  SPIRIVA RESPIMAT 1.25 MCG/ACT AERS INHALE 2 PUFFS INTO THE LUNGS DAILY. 09/27/17  Yes Kalman Shanamaswamy, Murali, MD  torsemide (DEMADEX) 20 MG tablet Take 1 tablet (20 mg total) by mouth daily. 12/08/17  Yes Shambley, Audie BoxAshleigh N, NP  traMADol (ULTRAM) 50 MG tablet TAKE 1 TABLET BY MOUTH EVERY 8 HOURS AS NEEDED FOR SEVERE PAIN 10/31/17  Yes Plotnikov, Georgina QuintAleksei V, MD  albuterol (VENTOLIN HFA) 108 (90 Base) MCG/ACT inhaler USE 2 PUFFS EVERY 6 HOURS AS NEEDED FOR WHEEZING Patient not taking: Reported on 01/11/2018 12/13/17   Kalman Shanamaswamy, Murali, MD  diclofenac sodium (VOLTAREN) 1 % GEL Apply 2 g topically 3 (three) times daily.  Patient not taking: Reported on 01/11/2018 09/28/17   Burna Cash, MD  insulin glargine (LANTUS) 100 UNIT/ML injection Inject 0.25 mLs (25 Units total) into the skin daily. 03/29/17   Hongalgi, Maximino Greenland, MD  insulin lispro (HUMALOG) 100 UNIT/ML injection Inject 0-0.15 mLs (0-15 Units total) into the skin 3 (three) times daily with meals. CBG < 70: Eat or drink something right away and recheck. CBG 70 - 120: 0 units CBG 121 - 150: 2 units CBG 151 - 200: 3 units CBG 201 - 250: 5 units CBG 251 - 300: 8 units CBG 301 - 350: 11 units CBG 351 - 400: 15 units CBG > 400: call MD. Patient taking differently: Inject 0-15 Units into the skin as needed. If BS >200 use 8 units  CBG < 70: Eat or drink something right away and recheck. CBG 70 - 120: 0 units CBG 121 - 150: 2 units CBG 151 - 200: 3 units CBG 201 - 250: 5 units CBG 251 - 300: 8 units CBG 301 -  350: 11 units CBG 351 - 400: 15 units CBG > 400: call MD. 03/28/17   Elease Etienne, MD  pantoprazole (PROTONIX) 40 MG tablet Take 1 tablet (40 mg total) by mouth 2 (two) times daily before a meal. Patient not taking: Reported on 01/11/2018 07/22/16   Jeralyn Bennett, MD  predniSONE (DELTASONE) 5 MG tablet Resume 5 mg daily after you have completed the 10mg  tab taper Patient not taking: Reported on 01/11/2018 03/28/17   Simonne Martinet, NP    Physical Exam: Vitals:   01/11/18 0521 01/11/18 0528 01/11/18 0531 01/11/18 0636  BP:  121/69 121/69 114/68  Pulse:  (!) 112 (!) 112 (!) 102  Resp:  (!) 26 (!) 26 (!) 27  Temp:   98.3 F (36.8 C)   TempSrc:   Oral   SpO2: 99% 92% 92% 93%   Constitutional: 42 y.o. female in no distress, calm demeanor Eyes: Lids and conjunctivae normal, PERRL, lateral strabismus noted. ENMT: Mucous membranes are moist. Posterior pharynx clear of any exudate or lesions. Poor dentition without abscess.  Neck: Normal, supple, no masses, no thyromegaly Respiratory: Non-labored tachypnea at rest, grows labored just with sitting up for pulmonary exam. No wheezing, diminished globally.  Cardiovascular: Regular rate and rhythm, no murmurs, rubs, or gallops. No carotid bruits. No JVD. No LE edema. 2+ pedal pulses. Abdomen: Normoactive bowel sounds. No tenderness, non-distended, and no masses palpated. No hepatosplenomegaly. GU: No indwelling catheter Musculoskeletal: No clubbing / cyanosis. No joint deformity upper and lower extremities. Good ROM, no contractures. Normal muscle tone.  Skin: Warm, dry. No rashes, wounds, no  ulcers. No significant lesions noted.  Neurologic: CN II-XII grossly intact. Gait not assessed. Speech normal. No focal deficits in motor strength or sensation in all extremities.  Psychiatric: Alert and oriented x3. Normal judgment and insight. Mood euthymic with broad affect.   Labs on Admission: I have personally reviewed following labs and imaging  studies  CBC: Recent Labs  Lab 01/11/18 0526  WBC 6.7  NEUTROABS 4.3  HGB 13.4  HCT 40.2  MCV 103.6*  PLT 121*   Basic Metabolic Panel: Recent Labs  Lab 01/11/18 0526  NA 136  K 4.1  CL 94*  CO2 31  GLUCOSE 178*  BUN 7  CREATININE 0.59  CALCIUM 9.1   GFR: CrCl cannot be calculated (Unknown ideal weight.). Liver Function Tests: No results for input(s): AST, ALT, ALKPHOS, BILITOT, PROT, ALBUMIN in the last 168  hours. No results for input(s): LIPASE, AMYLASE in the last 168 hours. No results for input(s): AMMONIA in the last 168 hours. Coagulation Profile: No results for input(s): INR, PROTIME in the last 168 hours. Cardiac Enzymes: No results for input(s): CKTOTAL, CKMB, CKMBINDEX, TROPONINI in the last 168 hours. BNP (last 3 results) Recent Labs    04/22/17 1153  PROBNP 129.0*   HbA1C: No results for input(s): HGBA1C in the last 72 hours. CBG: No results for input(s): GLUCAP in the last 168 hours. Lipid Profile: No results for input(s): CHOL, HDL, LDLCALC, TRIG, CHOLHDL, LDLDIRECT in the last 72 hours. Thyroid Function Tests: No results for input(s): TSH, T4TOTAL, FREET4, T3FREE, THYROIDAB in the last 72 hours. Anemia Panel: No results for input(s): VITAMINB12, FOLATE, FERRITIN, TIBC, IRON, RETICCTPCT in the last 72 hours. Urine analysis:    Component Value Date/Time   COLORURINE AMBER (A) 09/28/2017 1100   APPEARANCEUR CLOUDY (A) 09/28/2017 1100   LABSPEC 1.030 09/28/2017 1100   PHURINE 6.0 09/28/2017 1100   GLUCOSEU NEGATIVE 09/28/2017 1100   HGBUR NEGATIVE 09/28/2017 1100   BILIRUBINUR SMALL (A) 09/28/2017 1100   KETONESUR NEGATIVE 09/28/2017 1100   PROTEINUR 100 (A) 09/28/2017 1100   UROBILINOGEN 1.0 07/25/2015 1400   NITRITE NEGATIVE 09/28/2017 1100   LEUKOCYTESUR NEGATIVE 09/28/2017 1100    No results found for this or any previous visit (from the past 240 hour(s)).   Radiological Exams on Admission: Dg Chest 2 View  Result Date:  01/11/2018 CLINICAL DATA:  Shortness of Breath EXAM: CHEST  2 VIEW COMPARISON:  September 28, 2017 FINDINGS: There is no edema or consolidation. The heart size is slightly enlarged with pulmonary vascularity within normal limits. No adenopathy. No bone lesions. IMPRESSION: Slight cardiac enlargement, essentially stable. No edema or consolidation. Electronically Signed   By: Bretta Bang III M.D.   On: 01/11/2018 07:27    EKG: Independently reviewed. Sinus tachycardia without ST segment changes. T wave flattening in lead I and concordant TWI in aVL suggests old lateral ischemia.   Assessment/Plan Active Problems:   * No active hospital problems. *   Acute on chronic hypoxic respiratory failure: - Continue supplemental oxygen as needed, BiPAP qHS; has not needed HFNC or BiPAP in ED.    COPD with acute exacerbation:  - Dulera for symbicort, continue daliresp - IV steroids, bronchodilators TID and prn - Mucinex - flu swab pending, empiric droplet precautions  - COPD GOLD protocol consults ordered - Doxycycline   Tobacco use: Attempting to cut back, smokes irregularly.  - Brief cessation counseling provided - Nicotine patch is available prn.  T2DM, worsened by steroids:  - Monitor CBGs qAC/HS, give SSI and HS correction. Does not take long-acting insulin at home, but frequently requires it as inpatient. Will give lantus 10u tonight.   Chronic diastolic CHF: BNP 45, appears euvolemic. Last echo Feb 2018 w/EF 65-70%, G1DD, no WMA's. Biatrial dilatation which is also suggested on today's ECG.  - Continue home torsemide - I/O, daily weights  Diabetic polyneuropathy: - Neurontin, lyrica, and prn tramadol home medications ordered.  Thrombocytopenia: Mild. No bleeding.  - Monitor for bleeding, ok to start DVT ppx  DVT prophylaxis: Lovenox  Code Status: Full  Family Communication: None at bedside Disposition Plan: Home when improved Consults called: None  Admission status: Inpatient     Hazeline Junker, MD [facility] Pager [phone]  If 7PM-7AM, please contact night-coverage [email or web address] Password [facility] 01/11/2018, 8:27 AM

## 2018-01-11 NOTE — Evaluation (Signed)
Physical Therapy Evaluation Patient Details Name: Mary [name] MRN: [id number] DOB: [birth year] Today's Date: 01/11/2018   History of Present Illness  42 y.o. female with a history of COPD on 3L/m O2 and OSA/OHS on nocturnal BiPAP, formerly steroid-dependent, chronic diastolic CHF, history of DVT no longer on anticoagulation, and T2DM with neuropathy who presented to the ED for 2-3 days of worsening dyspnea with associated nonproductive cough.  Dx of acute on chronic respiratory failure.   Clinical Impression  Pt admitted with above diagnosis. Pt currently with functional limitations due to the deficits listed below (see PT Problem List). Pt ambulated 90' without an assistive device, no loss of balance, SaO2 94% on 4L O2, distance limited by 3/4 dyspnea. At baseline, pt is able to ambulate significantly farther before becoming short of breath.  Pt will benefit from skilled PT to increase their independence and safety with mobility to allow discharge to the venue listed below.       Follow Up Recommendations Home health PT    Equipment Recommendations  Other (comment)(4 wheeled walker with seat)    Recommendations for Other Services       Precautions / Restrictions Precautions Precautions: Other (comment) Precaution Comments: monitor O2, 1 fall in past 1 year related to low SaO2 2* O2 machine malfunction Restrictions Weight Bearing Restrictions: No      Mobility  Bed Mobility               General bed mobility comments: up on edge of bed  Transfers Overall transfer level: Independent                  Ambulation/Gait Ambulation/Gait assistance: Supervision Ambulation Distance (Feet): 90 Feet Assistive device: None Gait Pattern/deviations: Decreased stride length;Wide base of support   Gait velocity interpretation: at or above normal speed for age/gender General Gait Details: steady, no LOB, SaO2 94% on 4L O2 walking, distance limited by 3/4  dyspnea  Stairs            Wheelchair Mobility    Modified Rankin (Stroke Patients Only)       Balance Overall balance assessment: Independent                                           Pertinent Vitals/Pain Pain Assessment: No/denies pain    Home Living Family/patient expects to be discharged to:: Private residence Living Arrangements: Spouse/significant other Available Help at Discharge: Family;Available PRN/intermittently Type of Home: House Home Access: Stairs to enter   Entrance Stairs-Number of Steps: 3 Home Layout: Multi-level;Able to live on main level with bedroom/bathroom 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;Wheelchair - manual Additional Comments: w/c doesn't have foot rests; uses motorized scooter at [employer]    Prior Function Level of Independence: Independent         Comments: home alone during the day; reports she typically completes ADLs independently but occasionally needs spouse assistance for reaching feet and for when she's not feeling well      Hand Dominance   Dominant Hand: Right    Extremity/Trunk Assessment   Upper Extremity Assessment Upper Extremity Assessment: Defer to OT evaluation    Lower Extremity Assessment Lower Extremity Assessment: Overall WFL for tasks assessed    Cervical / Trunk Assessment Cervical / Trunk Assessment: Normal  Communication   Communication: No difficulties  Cognition Arousal/Alertness: Awake/alert Behavior During Therapy: WFL for tasks assessed/performed Overall  Cognitive Status: Within Functional Limits for tasks assessed                                        General Comments      Exercises     Assessment/Plan    PT Assessment Patient needs continued PT services  PT Problem List Cardiopulmonary status limiting activity;Decreased activity tolerance       PT Treatment Interventions Gait training;Patient/family education;Therapeutic exercise     PT Goals (Current goals can be found in the Care Plan section)  Acute Rehab PT Goals Patient Stated Goal: to be able to walk farther PT Goal Formulation: With patient Time For Goal Achievement: 01/25/18 Potential to Achieve Goals: Good    Frequency Min 3X/week   Barriers to discharge        Co-evaluation               AM-PAC PT "6 Clicks" Daily Activity  Outcome Measure Difficulty turning over in bed (including adjusting bedclothes, sheets and blankets)?: None Difficulty moving from lying on back to sitting on the side of the bed? : None Difficulty sitting down on and standing up from a chair with arms (e.g., wheelchair, bedside commode, etc,.)?: None Help needed moving to and from a bed to chair (including a wheelchair)?: None Help needed walking in hospital room?: None Help needed climbing 3-5 steps with a railing? : A Little 6 Click Score: 23    End of Session Equipment Utilized During Treatment: Gait belt;Oxygen Activity Tolerance: Patient limited by fatigue Patient left: in bed(sitting on edge of bed) Nurse Communication: Mobility status;Other (comment)(O2 status with ambulation) PT Visit Diagnosis: Difficulty in walking, not elsewhere classified (R26.2)    Time: [id number] PT Time Calculation (min) (ACUTE ONLY): 23 min   Charges:   PT Evaluation $PT Eval Low Complexity: 1 Low PT Treatments $Gait Training: 8-22 mins   PT G Codes:          Tamala SerUhlenberg, Tenlee Wollin Kistler 01/11/2018, 2:43 PM [phone]

## 2018-01-12 DIAGNOSIS — J9621 Acute and chronic respiratory failure with hypoxia: Secondary | ICD-10-CM

## 2018-01-12 DIAGNOSIS — E669 Obesity, unspecified: Secondary | ICD-10-CM

## 2018-01-12 DIAGNOSIS — J441 Chronic obstructive pulmonary disease with (acute) exacerbation: Principal | ICD-10-CM

## 2018-01-12 DIAGNOSIS — I5032 Chronic diastolic (congestive) heart failure: Secondary | ICD-10-CM

## 2018-01-12 DIAGNOSIS — E1169 Type 2 diabetes mellitus with other specified complication: Secondary | ICD-10-CM

## 2018-01-12 LAB — CBC
HCT: 39.7 % (ref 36.0–46.0)
HEMOGLOBIN: 12.9 g/dL (ref 12.0–15.0)
MCH: 33.6 pg (ref 26.0–34.0)
MCHC: 32.5 g/dL (ref 30.0–36.0)
MCV: 103.4 fL — ABNORMAL HIGH (ref 78.0–100.0)
Platelets: 127 10*3/uL — ABNORMAL LOW (ref 150–400)
RBC: 3.84 MIL/uL — ABNORMAL LOW (ref 3.87–5.11)
RDW: 13.6 % (ref 11.5–15.5)
WBC: 9.2 10*3/uL (ref 4.0–10.5)

## 2018-01-12 LAB — BASIC METABOLIC PANEL
Anion gap: 12 (ref 5–15)
BUN: 17 mg/dL (ref 6–20)
CALCIUM: 9.3 mg/dL (ref 8.9–10.3)
CO2: 32 mmol/L (ref 22–32)
Chloride: 88 mmol/L — ABNORMAL LOW (ref 101–111)
Creatinine, Ser: 0.58 mg/dL (ref 0.44–1.00)
GFR calc Af Amer: >60 mL/min (ref >60)
GLUCOSE: 359 mg/dL — AB (ref 65–99)
Potassium: 4.4 mmol/L (ref 3.5–5.1)
Sodium: 132 mmol/L — ABNORMAL LOW (ref 135–145)

## 2018-01-12 LAB — GLUCOSE, CAPILLARY
GLUCOSE-CAPILLARY: 350 mg/dL — AB (ref 65–99)
GLUCOSE-CAPILLARY: 418 mg/dL — AB (ref 65–99)
GLUCOSE-CAPILLARY: 413 mg/dL — AB (ref 65–99)
Glucose-Capillary: 485 mg/dL — ABNORMAL HIGH (ref 65–99)

## 2018-01-12 MED ORDER — POLYETHYLENE GLYCOL 3350 17 G PO PACK: 17.0000 g | PACK | Freq: Two times a day (BID) | ORAL | Status: DC | PRN

## 2018-01-12 MED ORDER — INSULIN ASPART 100 UNIT/ML ~~LOC~~ SOLN
4.0000 [IU] | Freq: Three times a day (TID) | SUBCUTANEOUS | Status: DC
  Administered 2018-01-12 (×2): 4 [IU] via SUBCUTANEOUS

## 2018-01-12 MED ORDER — INSULIN GLARGINE 100 UNIT/ML ~~LOC~~ SOLN
15.0000 [IU] | Freq: Every day | SUBCUTANEOUS | Status: DC
Start: 2018-01-13 — End: 2018-01-12

## 2018-01-12 MED ORDER — INSULIN ASPART 100 UNIT/ML ~~LOC~~ SOLN
10.0000 [IU] | Freq: Once | SUBCUTANEOUS | Status: AC
  Administered 2018-01-12: 10 [IU] via SUBCUTANEOUS

## 2018-01-12 MED ORDER — INSULIN ASPART 100 UNIT/ML ~~LOC~~ SOLN
6.0000 [IU] | Freq: Once | SUBCUTANEOUS | Status: AC
  Administered 2018-01-12: 6 [IU] via SUBCUTANEOUS

## 2018-01-12 MED ORDER — INSULIN GLARGINE 100 UNIT/ML ~~LOC~~ SOLN
10.0000 [IU] | Freq: Two times a day (BID) | SUBCUTANEOUS | Status: DC
  Administered 2018-01-12: 10 [IU] via SUBCUTANEOUS
  Filled 2018-01-12 (×2): qty 0.1

## 2018-01-12 MED ORDER — TRAZODONE HCL 50 MG PO TABS
25.0000 mg | ORAL_TABLET | Freq: Every evening | ORAL | Status: DC | PRN
  Administered 2018-01-12 – 2018-01-13 (×2): 25 mg via ORAL
  Filled 2018-01-12 (×2): qty 1

## 2018-01-12 NOTE — Progress Notes (Signed)
RN paged MD for BS of 413. MD stated to administer 15units of sliding scale, 4 units with meal and one time dose of 6units, for a total of 25 units of insulin.

## 2018-01-12 NOTE — Progress Notes (Signed)
HS blood sugar 485. K Schorr paged. Awaiting further orders.

## 2018-01-12 NOTE — Evaluation (Signed)
Occupational Therapy Evaluation Patient Details Name: Mary [name] MRN: [id number] DOB: 07/30/1976 Today's Date: 01/12/2018    History of Present Illness 42 y.o. female with a history of COPD on 3L/m O2 and OSA/OHS on nocturnal BiPAP, formerly steroid-dependent, chronic diastolic CHF, history of DVT no longer on anticoagulation, and T2DM with neuropathy who presented to the ED for 2-3 days of worsening dyspnea with associated nonproductive cough.  Dx of acute on chronic respiratory failure.    Clinical Impression   Pt admitted with worsening dyspnea. Pt currently with functional limitations due to the deficits listed below (see OT Problem List). Pt will benefit from skilled OT to increase their safety and independence with ADL and functional mobility for ADL to facilitate discharge to venue listed below.      Follow Up Recommendations  No OT follow up    Equipment Recommendations  None recommended by OT    Recommendations for Other Services       Precautions / Restrictions Precautions Precautions: Other (comment) Precaution Comments: monitor O2, 1 fall in past 1 year related to low SaO2 2* O2 machine malfunction Restrictions Weight Bearing Restrictions: No      Mobility Bed Mobility               General bed mobility comments: up on edge of bed  Transfers Overall transfer level: Needs assistance   Transfers: Sit to/from Stand;Stand Pivot Transfers Sit to Stand: Supervision Stand pivot transfers: Supervision                ADL either performed or assessed with clinical judgement   ADL Overall ADL's : Needs assistance/impaired                                       General ADL Comments: Pt overall S- mod I with ADL activity.  Pt fatigues quckly. Pt on 3L of oxygen and sats 89-91 with functional activity.  Will focus and educate pt on energy conservation with ADL activity      Vision Patient Visual Report: No change from baseline        Perception     Praxis      Pertinent Vitals/Pain Pain Assessment: No/denies pain     Hand Dominance Right   Extremity/Trunk Assessment Upper Extremity Assessment Upper Extremity Assessment: Generalized weakness           Communication Communication Communication: No difficulties   Cognition Arousal/Alertness: Awake/alert Behavior During Therapy: WFL for tasks assessed/performed Overall Cognitive Status: Within Functional Limits for tasks assessed                                                Home Living Family/patient expects to be discharged to:: Private residence Living Arrangements: Spouse/significant other Available Help at Discharge: Family;Available PRN/intermittently Type of Home: House Home Access: Stairs to enter Entrance Stairs-Number of Steps: 3   Home Layout: Multi-level;Able to live on main level with bedroom/bathroom Alternate Level Stairs-Number of Steps: flight to basement   Bathroom Shower/Tub: Chief Strategy OfficerTub/shower unit   Bathroom Toilet: Standard     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lectric scooter;Wheelchair - manual   Additional Comments: w/c doesn't have foot rests; [facility]      Prior Functioning/Environment Level of Independence: Independent  Comments: home alone during the day; reports she typically completes ADLs independently but occasionally needs spouse assistance for reaching feet and for when she's not feeling well         OT Problem List: Cardiopulmonary status limiting activity      OT Treatment/Interventions: Patient/family education;Energy conservation    OT Goals(Current goals can be found in the care plan section) Acute Rehab OT Goals Patient Stated Goal: to be able to walk farther OT Goal Formulation: With patient Time For Goal Achievement: 01/19/18 Potential to Achieve Goals: Good ADL Goals Pt Will Perform Lower Body Dressing: (P) with modified independence;sit to/from  stand Pt Will Transfer to Toilet: (P) with modified independence;ambulating Pt Will Perform Toileting - Clothing Manipulation and hygiene: (P) with modified independence;sit to/from stand  OT Frequency: Min 2X/week   Barriers to D/C:            Co-evaluation              AM-PAC PT "6 Clicks" Daily Activity     Outcome Measure Help from another person eating meals?: None Help from another person taking care of personal grooming?: None Help from another person toileting, which includes using toliet, bedpan, or urinal?: A Little Help from another person bathing (including washing, rinsing, drying)?: A Little Help from another person to put on and taking off regular upper body clothing?: None Help from another person to put on and taking off regular lower body clothing?: A Little 6 Click Score: 21   End of Session Nurse Communication: Mobility status  Activity Tolerance: Patient limited by lethargy Patient left: in bed                   Time: 1253-1316 OT Time Calculation (min): 23 min Charges:  OT General Charges $OT Visit: 1 Visit OT Evaluation $OT Eval Moderate Complexity: 1 Mod G-Codes:     Lise Auer, OT [phone]  Einar Crow D 01/12/2018, 1:34 PM

## 2018-01-12 NOTE — Progress Notes (Signed)
PROGRESS NOTE  Mary [name] WUJ:[id number] DOB: 07-27-1976 DOA: 01/11/2018 PCP: Veryl Speak, FNP   LOS: 1 day   Brief Narrative / Interim history: 42 year old female with history of COPD with chronic hypoxic respiratory failure on home 3 L nasal cannula, sleep apnea, chronic diastolic CHF, remote DVT and type 2 diabetes mellitus who presented to the emergency room for a few days of worsening shortness of breath.  She denies any fever or chills, URI type symptoms, she denies any sick contacts.  She believes this is related to the fact that she has been staying outside more, she has some asthma overlap and underlying environmental allergies which can sometimes trigger her exacerbations.  Assessment & Plan: Active Problems:   Chronic diastolic heart failure (HCC)   Diabetes mellitus type 2 in obese (HCC)   Diabetic neuropathy (HCC)   Acute on chronic respiratory failure with hypoxia and hypercapnia (HCC)   COPD with acute exacerbation (HCC)   Acute on chronic hypoxic respiratory failure due to COPD exacerbation -Continue supplemental oxygen, continue steroids, doxycycline, nebulizers -Slightly improved, however still significantly short of breath with ambulation  Tobacco use -Tells me she is cut down significantly and now smoking about 5 cigarettes/month.  Her husband continues to smoke however not indoors  Type 2 diabetes mellitus with diabetic neuropathy -Worsened significantly by steroids, normally she is not on long-acting insulin at home but requires it in the hospital, continue Lantus, increased dose today -Add scheduled mealtime insulin -Continue home medications with Neurontin, Lyrica and tramadol  Chronic diastolic CHF -BMP normal, she appears euvolemic, continue home torsemide  Thrombocytopenia -Stable, no bleeding, has a chronic component   DVT prophylaxis: Lovenox Code Status: Full code Family Communication: No family at bedside Disposition Plan: Home when  improved  Consultants:   None  Procedures:   None   Antimicrobials:  Doxycycline 2/6 >>  Subjective: - no chest pain, no abdominal pain, nausea or vomiting.  Still short of breath every time she moves around  Objective: Vitals:   01/11/18 2100 01/12/18 0435 01/12/18 0822 01/12/18 1327  BP: 125/76 105/67  (!) 112/58  Pulse: 78 (!) 58 80 (!) 57  Resp: 20 20 18 20   Temp: 97.9 F (36.6 C) 98.4 F (36.9 C)  98.1 F (36.7 C)  TempSrc: Oral Oral  Oral  SpO2: 94% 98% 97% 98%  Weight:  96 kg (211 lb 10.3 oz)    Height:        Intake/Output Summary (Last 24 hours) at 01/12/2018 1333 Last data filed at 01/12/2018 1329 Gross per 24 hour  Intake 1920 ml  Output 0 ml  Net 1920 ml   Filed Weights   01/11/18 1138 01/12/18 0435  Weight: 95.4 kg (210 lb 5.1 oz) 96 kg (211 lb 10.3 oz)    Examination:  Constitutional: NAD Eyes: lids and conjunctivae normal ENMT: Mucous membranes are moist Neck: normal, supple Respiratory: Bilateral wheezing present, no crackles, increased respiratory effort Cardiovascular: Regular rate and rhythm, no murmurs / rubs / gallops. No LE edema. Abdomen: no tenderness. Bowel sounds positive.  Skin: no rashes Neurologic: CN 2-12 grossly intact. Strength 5/5 in all 4.  Psychiatric: Normal judgment and insight. Alert and oriented x 3. Normal mood.    Data Reviewed: I have independently reviewed following labs and imaging studies   CBC: Recent Labs  Lab 01/11/18 0526 01/12/18 0432  WBC 6.7 9.2  NEUTROABS 4.3  --   HGB 13.4 12.9  HCT 40.2 39.7  MCV 103.6*  103.4*  PLT 121* 127*   Basic Metabolic Panel: Recent Labs  Lab 01/11/18 0526 01/12/18 0432  NA 136 132*  K 4.1 4.4  CL 94* 88*  CO2 31 32  GLUCOSE 178* 359*  BUN 7 17  CREATININE 0.59 0.58  CALCIUM 9.1 9.3   GFR: Estimated Creatinine Clearance: 103 mL/min (by C-G formula based on SCr of 0.58 mg/dL). Liver Function Tests: No results for input(s): AST, ALT, ALKPHOS, BILITOT,  PROT, ALBUMIN in the last 168 hours. No results for input(s): LIPASE, AMYLASE in the last 168 hours. No results for input(s): AMMONIA in the last 168 hours. Coagulation Profile: No results for input(s): INR, PROTIME in the last 168 hours. Cardiac Enzymes: No results for input(s): CKTOTAL, CKMB, CKMBINDEX, TROPONINI in the last 168 hours. BNP (last 3 results) Recent Labs    04/22/17 1153  PROBNP 129.0*   HbA1C: No results for input(s): HGBA1C in the last 72 hours. CBG: Recent Labs  Lab 01/11/18 1138 01/11/18 1627 01/11/18 2105 01/12/18 0734 01/12/18 1210  GLUCAP 271* 318* 389* 350* 418*   Lipid Profile: No results for input(s): CHOL, HDL, LDLCALC, TRIG, CHOLHDL, LDLDIRECT in the last 72 hours. Thyroid Function Tests: No results for input(s): TSH, T4TOTAL, FREET4, T3FREE, THYROIDAB in the last 72 hours. Anemia Panel: No results for input(s): VITAMINB12, FOLATE, FERRITIN, TIBC, IRON, RETICCTPCT in the last 72 hours. Urine analysis:    Component Value Date/Time   COLORURINE AMBER (A) 09/28/2017 1100   APPEARANCEUR CLOUDY (A) 09/28/2017 1100   LABSPEC 1.030 09/28/2017 1100   PHURINE 6.0 09/28/2017 1100   GLUCOSEU NEGATIVE 09/28/2017 1100   HGBUR NEGATIVE 09/28/2017 1100   BILIRUBINUR SMALL (A) 09/28/2017 1100   KETONESUR NEGATIVE 09/28/2017 1100   PROTEINUR 100 (A) 09/28/2017 1100   UROBILINOGEN 1.0 07/25/2015 1400   NITRITE NEGATIVE 09/28/2017 1100   LEUKOCYTESUR NEGATIVE 09/28/2017 1100   Sepsis Labs: Invalid input(s): PROCALCITONIN, LACTICIDVEN  No results found for this or any previous visit (from the past 240 hour(s)).    Radiology Studies: Dg Chest 2 View  Result Date: 01/11/2018 CLINICAL DATA:  Shortness of Breath EXAM: CHEST  2 VIEW COMPARISON:  September 28, 2017 FINDINGS: There is no edema or consolidation. The heart size is slightly enlarged with pulmonary vascularity within normal limits. No adenopathy. No bone lesions. IMPRESSION: Slight cardiac  enlargement, essentially stable. No edema or consolidation. Electronically Signed   By: Bretta BangWilliam  Woodruff III M.D.   On: 01/11/2018 07:27     Scheduled Meds: . doxycycline  100 mg Oral Q12H  . enoxaparin (LOVENOX) injection  40 mg Subcutaneous Q24H  . gabapentin  600 mg Oral TID  . insulin aspart  0-15 Units Subcutaneous TID WC  . insulin aspart  0-5 Units Subcutaneous QHS  . insulin aspart  4 Units Subcutaneous TID WC  . [START ON 01/13/2018] insulin glargine  15 Units Subcutaneous Daily  . ipratropium-albuterol  3 mL Nebulization TID  . methylPREDNISolone (SOLU-MEDROL) injection  60 mg Intravenous Q8H  . mometasone-formoterol  2 puff Inhalation BID  . pregabalin  50 mg Oral BID  . roflumilast  500 mcg Oral Daily  . sodium chloride flush  3 mL Intravenous Q12H  . torsemide  20 mg Oral Daily   Continuous Infusions: . sodium chloride       Pamella Pertostin Gherghe, MD, PhD [facility] Pager [phone]  If 7PM-7AM, please contact night-coverage [email or web address] Password [email or web address] 01/12/2018, 1:33 PM

## 2018-01-12 NOTE — Progress Notes (Signed)
Inpatient Diabetes Program Recommendations  AACE/ADA: New Consensus Statement on Inpatient Glycemic Control (2015)  Target Ranges:  Prepandial:   less than 140 mg/dL      Peak postprandial:   less than 180 mg/dL (1-2 hours)      Critically ill patients:  140 - 180 mg/dL   Lab Results  Component Value Date   GLUCAP 350 (H) 01/12/2018   HGBA1C 6.8 (H) 04/24/2017   Review of Glycemic Control  Diabetes history: DM 2 Outpatient Diabetes medications: Lantus 24 units as needed, Humalog 0-15 units tid Current orders for Inpatient glycemic control: Lantus 10 units, Novolog Moderate 0-15 units tid, Novolog HS scale 0-5 units  Inpatient Diabetes Program Recommendations:    Patient receiving IV Solumedrol 60 mg Q8 hours. Glucose in the mid 300 range. Please consider increasing Lantus to 15 units and add Novolog meal coverage 4 units tid in addition to correction scale if patient consumes at least 50% of meals and glucose is at least 80 mg/dl.  Also consider obtaining an A1c level to determine glucose control over the past 2-3 months.  Thanks,  Christena DeemShannon Chaim Gatley RN, MSN, [facility] Inpatient Diabetes Coordinator Team Pager [phone] (8a-5p)

## 2018-01-12 NOTE — Progress Notes (Signed)
RN paged MD for BS 418. MD called and told RN to give 15 units per max sliding scale, plus meal coverage of 4 units.

## 2018-01-13 LAB — GLUCOSE, CAPILLARY
GLUCOSE-CAPILLARY: 442 mg/dL — AB (ref 65–99)
GLUCOSE-CAPILLARY: 325 mg/dL — AB (ref 65–99)
Glucose-Capillary: 485 mg/dL — ABNORMAL HIGH (ref 65–99)
Glucose-Capillary: 272 mg/dL — ABNORMAL HIGH (ref 65–99)

## 2018-01-13 MED ORDER — INSULIN ASPART 100 UNIT/ML ~~LOC~~ SOLN
5.0000 [IU] | Freq: Three times a day (TID) | SUBCUTANEOUS | Status: DC
  Administered 2018-01-13 (×2): 5 [IU] via SUBCUTANEOUS

## 2018-01-13 MED ORDER — INSULIN ASPART 100 UNIT/ML ~~LOC~~ SOLN
0.0000 [IU] | Freq: Three times a day (TID) | SUBCUTANEOUS | Status: DC
  Administered 2018-01-13: 20 [IU] via SUBCUTANEOUS
  Administered 2018-01-13: 15 [IU] via SUBCUTANEOUS
  Administered 2018-01-14: 4 [IU] via SUBCUTANEOUS

## 2018-01-13 MED ORDER — INSULIN ASPART 100 UNIT/ML ~~LOC~~ SOLN
0.0000 [IU] | Freq: Every day | SUBCUTANEOUS | Status: DC
  Administered 2018-01-13: 3 [IU] via SUBCUTANEOUS

## 2018-01-13 MED ORDER — PREDNISONE 20 MG PO TABS
40.0000 mg | ORAL_TABLET | Freq: Every day | ORAL | Status: DC
  Administered 2018-01-14: 40 mg via ORAL
  Filled 2018-01-13: qty 2

## 2018-01-13 MED ORDER — INSULIN ASPART 100 UNIT/ML ~~LOC~~ SOLN
25.0000 [IU] | Freq: Once | SUBCUTANEOUS | Status: AC
Start: 2018-01-13 — End: 2018-01-13
  Administered 2018-01-13: 25 [IU] via SUBCUTANEOUS

## 2018-01-13 MED ORDER — GUAIFENESIN-DM 100-10 MG/5ML PO SYRP
5.0000 mL | ORAL_SOLUTION | ORAL | Status: DC | PRN
  Administered 2018-01-13 – 2018-01-14 (×2): 5 mL via ORAL
  Filled 2018-01-13 (×3): qty 5

## 2018-01-13 MED ORDER — INSULIN GLARGINE 100 UNIT/ML ~~LOC~~ SOLN
15.0000 [IU] | Freq: Two times a day (BID) | SUBCUTANEOUS | Status: DC
  Administered 2018-01-13 (×2): 15 [IU] via SUBCUTANEOUS
  Filled 2018-01-13 (×3): qty 0.15

## 2018-01-13 NOTE — Progress Notes (Signed)
PROGRESS NOTE  Mary [name]:[id number] DOB: February 26, 1976 DOA: 01/11/2018 PCP: Veryl Speak, FNP   LOS: 2 days   Brief Narrative / Interim history: 42 year old female with history of COPD with chronic hypoxic respiratory failure on home 3 L nasal cannula, sleep apnea, chronic diastolic CHF, remote DVT and type 2 diabetes mellitus who presented to the emergency room for a few days of worsening shortness of breath.  She denies any fever or chills, URI type symptoms, she denies any sick contacts.  She believes this is related to the fact that she has been staying outside more, she has some asthma overlap and underlying environmental allergies which can sometimes trigger her exacerbations.  Assessment & Plan: Active Problems:   Chronic diastolic heart failure (HCC)   Diabetes mellitus type 2 in obese (HCC)   Diabetic neuropathy (HCC)   Acute on chronic respiratory failure with hypoxia and hypercapnia (HCC)   COPD with acute exacerbation (HCC)   Acute on chronic hypoxic respiratory failure due to COPD exacerbation -Continue supplemental oxygen, continue steroids, doxycycline, nebulizers -Still short of breath with going to the bathroom, appreciates improvement  Tobacco use -Tells me she is cut down significantly and now smoking about 5 cigarettes/month.  Her husband continues to smoke however not indoors  Type 2 diabetes mellitus with diabetic neuropathy -Worsened significantly by steroids, normally she is not on long-acting insulin at home but requires it in the hospital, will keep up titrating Lantus, added mealtime NovoLog and change sliding scale to resistant -CBGs still poorly controlled -Continue home medications with Neurontin, Lyrica and tramadol  Chronic diastolic CHF -BMP normal, she appears euvolemic, continue home torsemide  Thrombocytopenia -Stable, no bleeding, has a chronic component   DVT prophylaxis: Lovenox Code Status: Full code Family Communication: No  family at bedside Disposition Plan: Home when improved  Consultants:   None  Procedures:   None   Antimicrobials:  Doxycycline 2/6 >>  Subjective: -Denies any chest pain, no abdominal pain nausea or vomiting.  Complains of ongoing shortness of breath, improved  Objective: Vitals:   01/12/18 2100 01/13/18 0410 01/13/18 1012 01/13/18 1300  BP: (!) 111/58 (!) 104/56  (!) 98/57  Pulse: 72 60 88 61  Resp: 16 20 18 18   Temp: 97.7 F (36.5 C) 98.2 F (36.8 C)  97.7 F (36.5 C)  TempSrc: Oral Oral  Axillary  SpO2: 95% 100% 96% 99%  Weight:  96.3 kg (212 lb 4.9 oz)    Height:        Intake/Output Summary (Last 24 hours) at 01/13/2018 1455 Last data filed at 01/13/2018 1400 Gross per 24 hour  Intake 1080 ml  Output -  Net 1080 ml   Filed Weights   01/11/18 1138 01/12/18 0435 01/13/18 0410  Weight: 95.4 kg (210 lb 5.1 oz) 96 kg (211 lb 10.3 oz) 96.3 kg (212 lb 4.9 oz)    Examination:  Constitutional: No distress Eyes: No scleral icterus, lids and conjunctivae normal ENMT: Moist mucous membranes Respiratory: Overall decreased breath sounds, no wheezing today, normal respiratory effort Cardiovascular: Regular rate and rhythm without murmurs, no edema Abdomen: Soft, nontender, nondistended, bowel sounds positive Skin: No rashes Neurologic: Nonfocal, equal strength, ambulatory Psychiatric: Normal judgment and insight. Alert and oriented x 3. Normal mood.    Data Reviewed: I have independently reviewed following labs and imaging studies   CBC: Recent Labs  Lab 01/11/18 0526 01/12/18 0432  WBC 6.7 9.2  NEUTROABS 4.3  --   HGB 13.4 12.9  HCT 40.2 39.7  MCV 103.6* 103.4*  PLT 121* 127*   Basic Metabolic Panel: Recent Labs  Lab 01/11/18 0526 01/12/18 0432  NA 136 132*  K 4.1 4.4  CL 94* 88*  CO2 31 32  GLUCOSE 178* 359*  BUN 7 17  CREATININE 0.59 0.58  CALCIUM 9.1 9.3   GFR: Estimated Creatinine Clearance: 103.3 mL/min (by C-G formula based on SCr of  0.58 mg/dL). Liver Function Tests: No results for input(s): AST, ALT, ALKPHOS, BILITOT, PROT, ALBUMIN in the last 168 hours. No results for input(s): LIPASE, AMYLASE in the last 168 hours. No results for input(s): AMMONIA in the last 168 hours. Coagulation Profile: No results for input(s): INR, PROTIME in the last 168 hours. Cardiac Enzymes: No results for input(s): CKTOTAL, CKMB, CKMBINDEX, TROPONINI in the last 168 hours. BNP (last 3 results) Recent Labs    04/22/17 1153  PROBNP 129.0*   HbA1C: No results for input(s): HGBA1C in the last 72 hours. CBG: Recent Labs  Lab 01/12/18 1210 01/12/18 1736 01/12/18 2100 01/13/18 0808 01/13/18 1146  GLUCAP 418* 413* 485* 485* 442*   Lipid Profile: No results for input(s): CHOL, HDL, LDLCALC, TRIG, CHOLHDL, LDLDIRECT in the last 72 hours. Thyroid Function Tests: No results for input(s): TSH, T4TOTAL, FREET4, T3FREE, THYROIDAB in the last 72 hours. Anemia Panel: No results for input(s): VITAMINB12, FOLATE, FERRITIN, TIBC, IRON, RETICCTPCT in the last 72 hours. Urine analysis:    Component Value Date/Time   COLORURINE AMBER (A) 09/28/2017 1100   APPEARANCEUR CLOUDY (A) 09/28/2017 1100   LABSPEC 1.030 09/28/2017 1100   PHURINE 6.0 09/28/2017 1100   GLUCOSEU NEGATIVE 09/28/2017 1100   HGBUR NEGATIVE 09/28/2017 1100   BILIRUBINUR SMALL (A) 09/28/2017 1100   KETONESUR NEGATIVE 09/28/2017 1100   PROTEINUR 100 (A) 09/28/2017 1100   UROBILINOGEN 1.0 07/25/2015 1400   NITRITE NEGATIVE 09/28/2017 1100   LEUKOCYTESUR NEGATIVE 09/28/2017 1100   Sepsis Labs: Invalid input(s): PROCALCITONIN, LACTICIDVEN  No results found for this or any previous visit (from the past 240 hour(s)).    Radiology Studies: No results found.   Scheduled Meds: . doxycycline  100 mg Oral Q12H  . enoxaparin (LOVENOX) injection  40 mg Subcutaneous Q24H  . gabapentin  600 mg Oral TID  . insulin aspart  0-20 Units Subcutaneous TID WC  . insulin aspart  0-5  Units Subcutaneous QHS  . insulin aspart  5 Units Subcutaneous TID WC  . insulin glargine  15 Units Subcutaneous BID  . ipratropium-albuterol  3 mL Nebulization TID  . mometasone-formoterol  2 puff Inhalation BID  . [START ON 01/14/2018] predniSONE  40 mg Oral Q breakfast  . pregabalin  50 mg Oral BID  . roflumilast  500 mcg Oral Daily  . sodium chloride flush  3 mL Intravenous Q12H  . torsemide  20 mg Oral Daily   Continuous Infusions: . sodium chloride       Pamella Pertostin Jassiah Viviano, MD, PhD [facility] Pager [phone]  If 7PM-7AM, please contact night-coverage [email or web address] Password [email or web address] 01/13/2018, 2:55 PM

## 2018-01-13 NOTE — Progress Notes (Signed)
Inpatient Diabetes Program Recommendations  AACE/ADA: New Consensus Statement on Inpatient Glycemic Control (2015)  Target Ranges:  Prepandial:   less than 140 mg/dL      Peak postprandial:   less than 180 mg/dL (1-2 hours)      Critically ill patients:  140 - 180 mg/dL   Lab Results  Component Value Date   GLUCAP 485 (H) 01/13/2018   HGBA1C 6.8 (H) 04/24/2017    Review of Glycemic Control  Diabetes history: DM 2 Outpatient Diabetes medications: Lantus 24 units as needed, Humalog 0-15 units tid Current orders for Inpatient glycemic control: Lantus 10 units BID, Novolog Moderate 0-15 units tid, Novolog HS scale 0-5 units, Novolog 4 units tid meal coverage  Inpatient Diabetes Program Recommendations:    Glucose 400's in setting of steroids. If steroids dose stays the same, consider increasing Lantus to 16-18 units BID and increase Novolog meal coverage to 6 units.  Thanks,  Christena DeemShannon Elvert Cumpton RN, MSN, [facility] Inpatient Diabetes Coordinator Team Pager [phone] (8a-5p)

## 2018-01-13 NOTE — Progress Notes (Signed)
Occupational Therapy Treatment Patient Details Name: Mary [name] MRN: [id number] DOB: 01/26/1976 Today's Date: 01/13/2018    History of present illness 42 y.o. female with a history of COPD on 3L/m O2 and OSA/OHS on nocturnal BiPAP, formerly steroid-dependent, chronic diastolic CHF, history of DVT no longer on anticoagulation, and T2DM with neuropathy who presented to the ED for 2-3 days of worsening dyspnea with associated nonproductive cough.  Dx of acute on chronic respiratory failure.    OT comments  OT goals met  Follow Up Recommendations  No OT follow up    Equipment Recommendations  None recommended by OT       Precautions / Restrictions Precautions Precautions: Other (comment) Precaution Comments: monitor oxygen Restrictions Weight Bearing Restrictions: No       Mobility Bed Mobility Overal bed mobility: Independent             General bed mobility comments: up on edge of bed  Transfers Overall transfer level: Modified independent   Transfers: Sit to/from Stand;Stand Pivot Transfers Sit to Stand: Modified independent (Device/Increase time) Stand pivot transfers: Modified independent (Device/Increase time)       General transfer comment: walked with oxygen roller        ADL either performed or assessed with clinical judgement   ADL Overall ADL's : Modified independent                                       General ADL Comments: pt mod I with ADL activity.  Pt able to take rest breaks as needed.  Pt able to walk in unit with 3L of oxygen and sats stayed over 90.  Encouraged pt to walk with oxygen .  No balance deficits  Pt aware of energy conservation techniques.       Vision Patient Visual Report: No change from baseline            Cognition Arousal/Alertness: Awake/alert Behavior During Therapy: WFL for tasks assessed/performed Overall Cognitive Status: Within Functional Limits for tasks assessed                                                      Pertinent Vitals/ Pain       Pain Assessment: No/denies pain         Frequency  Min 2X/week        Progress Toward Goals  OT Goals(current goals can now be found in the care plan section)  Progress towards OT goals: Goals met/education completed, patient discharged from OT     Plan All goals met and education completed, patient discharged from OT services    Co-evaluation                 AM-PAC PT "6 Clicks" Daily Activity     Outcome Measure   Help from another person eating meals?: None Help from another person taking care of personal grooming?: None Help from another person toileting, which includes using toliet, bedpan, or urinal?: None Help from another person bathing (including washing, rinsing, drying)?: None Help from another person to put on and taking off regular upper body clothing?: None Help from another person to put on and taking off regular lower body clothing?: None 6 Click Score: 24      End of Session Equipment Utilized During Treatment: Oxygen      Activity Tolerance Patient limited by lethargy   Patient Left in bed   Nurse Communication Mobility status        Time: 0932-3557 OT Time Calculation (min): 17 min  Charges: OT General Charges $OT Visit: 1 Visit OT Treatments $Self Care/Home Management : 8-22 mins  [place], [place]   Payton Mccallum D 01/13/2018, 10:15 AM

## 2018-01-14 LAB — GLUCOSE, CAPILLARY: Glucose-Capillary: 151 mg/dL — ABNORMAL HIGH (ref 65–99)

## 2018-01-14 MED ORDER — INSULIN ASPART 100 UNIT/ML ~~LOC~~ SOLN: 3.0000 [IU] | Freq: Three times a day (TID) | SUBCUTANEOUS | Status: DC

## 2018-01-14 MED ORDER — BASAGLAR KWIKPEN 100 UNIT/ML ~~LOC~~ SOPN: 15.0000 [IU] | PEN_INJECTOR | Freq: Every day | SUBCUTANEOUS | 2 refills | Status: DC | PRN

## 2018-01-14 MED ORDER — INSULIN GLARGINE 100 UNIT/ML ~~LOC~~ SOLN
10.0000 [IU] | Freq: Two times a day (BID) | SUBCUTANEOUS | Status: DC
  Filled 2018-01-14: qty 0.1

## 2018-01-14 MED ORDER — PREDNISONE 10 MG PO TABS: 40.0000 mg | ORAL_TABLET | Freq: Every day | ORAL | 0 refills | Status: DC

## 2018-01-14 MED ORDER — DOXYCYCLINE HYCLATE 100 MG PO TABS: 100.0000 mg | ORAL_TABLET | Freq: Two times a day (BID) | ORAL | 0 refills | Status: DC

## 2018-01-14 MED ORDER — GUAIFENESIN-DM 100-10 MG/5ML PO SYRP: 5.0000 mL | ORAL_SOLUTION | ORAL | 0 refills | Status: DC | PRN

## 2018-01-14 MED ORDER — INSULIN LISPRO 100 UNIT/ML ~~LOC~~ SOLN: 0.0000 [IU] | SUBCUTANEOUS | 2 refills | Status: DC | PRN

## 2018-01-14 NOTE — Discharge Instructions (Signed)
Follow with Veryl Speakalone, Gregory D, FNP in 5-7 days  Please get a complete blood count and chemistry panel checked by your Primary MD at your next visit, and again as instructed by your Primary MD. Please get your medications reviewed and adjusted by your Primary MD.  Please request your Primary MD to go over all Hospital Tests and Procedure/Radiological results at the follow up, please get all Hospital records sent to your Prim MD by signing hospital release before you go home.  If you had Pneumonia of Lung problems at the Hospital: Please get a 2 view Chest X ray done in 6-8 weeks after hospital discharge or sooner if instructed by your Primary MD.  If you have Congestive Heart Failure: Please call your Cardiologist or Primary MD anytime you have any of the following symptoms:  1) 3 pound weight gain in 24 hours or 5 pounds in 1 week  2) shortness of breath, with or without a dry hacking cough  3) swelling in the hands, feet or stomach  4) if you have to sleep on extra pillows at night in order to breathe  Follow cardiac low salt diet and 1.5 lit/day fluid restriction.  If you have diabetes Accuchecks 4 times/day, Once in AM empty stomach and then before each meal. Log in all results and show them to your primary doctor at your next visit. If any glucose reading is under 80 or above 300 call your primary MD immediately.  If you have Seizure/Convulsions/Epilepsy: Please do not drive, operate heavy machinery, participate in activities at heights or participate in high speed sports until you have seen by Primary MD or a Neurologist and advised to do so again.  If you had Gastrointestinal Bleeding: Please ask your Primary MD to check a complete blood count within one week of discharge or at your next visit. Your endoscopic/colonoscopic biopsies that are pending at the time of discharge, will also need to followed by your Primary MD.  Get Medicines reviewed and adjusted. Please take all your  medications with you for your next visit with your Primary MD  Please request your Primary MD to go over all hospital tests and procedure/radiological results at the follow up, please ask your Primary MD to get all Hospital records sent to his/her office.  If you experience worsening of your admission symptoms, develop shortness of breath, life threatening emergency, suicidal or homicidal thoughts you must seek medical attention immediately by calling 911 or calling your MD immediately  if symptoms less severe.  You must read complete instructions/literature along with all the possible adverse reactions/side effects for all the Medicines you take and that have been prescribed to you. Take any new Medicines after you have completely understood and accpet all the possible adverse reactions/side effects.   Do not drive or operate heavy machinery when taking Pain medications.   Do not take more than prescribed Pain, Sleep and Anxiety Medications  Special Instructions: If you have smoked or chewed Tobacco  in the last 2 yrs please stop smoking, stop any regular Alcohol  and or any Recreational drug use.  Wear Seat belts while driving.  Please note You were cared for by a hospitalist during your hospital stay. If you have any questions about your discharge medications or the care you received while you were in the hospital after you are discharged, you can call the unit and asked to speak with the hospitalist on call if the hospitalist that took care of you is not available.  Once you are discharged, your primary care physician will handle any further medical issues. Please note that NO REFILLS for any discharge medications will be authorized once you are discharged, as it is imperative that you return to your primary care physician (or establish a relationship with a primary care physician if you do not have one) for your aftercare needs so that they can reassess your need for medications and monitor your  lab values.  You can reach the hospitalist office at phone [phone] or fax [phone]   If you do not have a primary care physician, you can call [phone] for a physician referral.  Activity: As tolerated with Full fall precautions use walker/cane & assistance as needed  Diet: diabetic  Disposition Home

## 2018-01-14 NOTE — Discharge Summary (Signed)
Physician Discharge Summary  Mary [name] ZOX:[id number] DOB: 24-Jan-1976 DOA: 01/11/2018  PCP: Mary Speak, FNP  Admit date: 01/11/2018 Discharge date: 01/14/2018  Admitted From: home Disposition:  home  Recommendations for Outpatient Follow-up:  1. Follow up with PCP in 1-2 weeks 2. Continue doxycycline and prednisone taper as below  Home Health: none Equipment/Devices: none  Discharge Condition: stable CODE STATUS: Full code Diet recommendation: diabetic   HPI: Per Dr. Jarvis Newcomer, Mary [name] is a 41 y.o. female with a history of COPD on 3L/m O2 and OSA/OHS on nocturnal BiPAP, formerly steroid-dependent, chronic diastolic CHF, history of DVT no longer on anticoagulation, and T2DM with neuropathy who presented to the ED for 2-3 days of worsening dyspnea with associated nonproductive cough. Dyspnea came on gradually without fever or chills. No sick contacts, but she's been sitting on her porch more with the nicer weather, and has had environmental allergies precipitate exacerbations in the past, most recently hospitalized May 2018. The morning of presentation she reported dyspnea that was at rest, only somewhat improved with breathing traetments and SpO2 in 60%'s, so she increased her oxygen flow. She felt shaky, which is typical for her symptoms during exacerbation, and came to the hospital. On arrival she was afebrile with sinus tachycardia, tachypnea, saturating >90% on increased O2 at 4L. WBC 6.7, platelets 121. CXR showed no consolidation, mild cardiomegaly without pulmonary edema. Troponin was negative and ECG showed no acute ischemic changes. She did not appear volume overloaded, BNP 45, tropnin negative. Nebulizers, increased oxygen, and doxycycline started and hospitalists called to admit. Review of Systems: Denies fever, chills (of note, she did report this to EDP), weight loss, changes in vision or hearing, headache, chest pain, palpitations, abdominal pain, nausea,  vomiting, changes in bowel habits, blood in stool, change in bladder habits, myalgias, arthralgias, and rash, and per HPI. All others reviewed and are negative.   Hospital Course: Acute on chronic hypoxic respiratory failure due to COPD exacerbation -patient was admitted to the hospital in the setting of COPD exacerbation.  She was placed on supplemental oxygen, steroids, empiric doxycycline given productive cough as well as nebulizers.  She improved with the above-mentioned measures, her oxygen level was able to be weaned off to her home 3 L nasal cannula, she is able to ambulate in the hallway without significant difficulties and will be discharged home in stable condition with a short course of antibiotics as well as a prednisone taper. Tobacco use -Tells me she is cut down significantly and now smoking about 5 cigarettes/month.  Her husband continues to smoke however not indoors.  Advised to quit completely Type 2 diabetes mellitus with diabetic neuropathy -Worsened significantly by steroids.  CBGs improved as her IV steroids were weaned, continue home insulin regimen.  She is not taking her insulin regularly but only when she is on steroids Chronic diastolic CHF -BMP normal, she appears euvolemic, continue home torsemide Thrombocytopenia -Stable, no bleeding, has a chronic component   Discharge Diagnoses:  Active Problems:   Chronic diastolic heart failure (HCC)   Diabetes mellitus type 2 in obese [facility])   Diabetic neuropathy (HCC)   Acute on chronic respiratory failure with hypoxia and hypercapnia (HCC)   COPD with acute exacerbation (HCC)   Discharge Instructions  Allergies as of 01/14/2018   No Known Allergies     Medication List    TAKE these medications   albuterol 108 (90 Base) MCG/ACT inhaler Commonly known as:  PROVENTIL HFA;VENTOLIN HFA Inhale 2 puffs  into the lungs every 6 (six) hours as needed for wheezing or shortness of breath.   albuterol 108 (90 Base) MCG/ACT  inhaler Commonly known as:  VENTOLIN HFA USE 2 PUFFS EVERY 6 HOURS AS NEEDED FOR WHEEZING   BASAGLAR KWIKPEN 100 UNIT/ML Sopn Inject 0.15 mLs (15 Units total) into the skin daily as needed (BS). What changed:    how much to take  Another medication with the same name was removed. Continue taking this medication, and follow the directions you see here.   budesonide-formoterol 160-4.5 MCG/ACT inhaler Commonly known as:  SYMBICORT Inhale 2 puffs into the lungs 2 (two) times daily. What changed:  when to take this   diclofenac sodium 1 % Gel Commonly known as:  VOLTAREN Apply 2 g topically 3 (three) times daily.   doxycycline 100 MG tablet Commonly known as:  VIBRA-TABS Take 1 tablet (100 mg total) by mouth every 12 (twelve) hours.   gabapentin 300 MG capsule Commonly known as:  NEURONTIN Take 2 capsules (600 mg total) by mouth 3 (three) times daily.   guaiFENesin 600 MG 12 hr tablet Commonly known as:  MUCINEX Take 1 tablet (600 mg total) by mouth 2 (two) times daily. What changed:    when to take this  reasons to take this   guaiFENesin-dextromethorphan 100-10 MG/5ML syrup Commonly known as:  ROBITUSSIN DM Take 5 mLs by mouth every 4 (four) hours as needed for cough.   insulin lispro 100 UNIT/ML injection Commonly known as:  HUMALOG Inject 0-0.15 mLs (0-15 Units total) into the skin as needed. If BS >200 use 8 units  CBG < 70: Eat or drink something right away and recheck. CBG 70 - 120: 0 units CBG 121 - 150: 2 units CBG 151 - 200: 3 units CBG 201 - 250: 5 units CBG 251 - 300: 8 units CBG 301 - 350: 11 units CBG 351 - 400: 15 units CBG > 400: call MD. What changed:    when to take this  reasons to take this  additional instructions   pantoprazole 40 MG tablet Commonly known as:  PROTONIX Take 1 tablet (40 mg total) by mouth 2 (two) times daily before a meal.   potassium chloride SA 20 MEQ tablet Commonly known as:  K-DUR,KLOR-CON Take 1 tablet (20  mEq total) by mouth daily.   predniSONE 10 MG tablet Commonly known as:  DELTASONE Take 4 tablets (40 mg total) by mouth daily. 4 tablets daily x 3 days then 3 daily x 3 days then 2 daily x 3 days then 1 daily x 3 days What changed:    medication strength  how much to take  how to take this  when to take this  additional instructions   pregabalin 50 MG capsule Commonly known as:  LYRICA Take 1 capsule (50 mg total) by mouth 2 (two) times daily.   roflumilast 500 MCG Tabs tablet Commonly known as:  DALIRESP Take 500 mcg by mouth daily.   SPIRIVA RESPIMAT 1.25 MCG/ACT Aers Generic drug:  Tiotropium Bromide Monohydrate INHALE 2 PUFFS INTO THE LUNGS DAILY.   torsemide 20 MG tablet Commonly known as:  DEMADEX Take 1 tablet (20 mg total) by mouth daily.   traMADol 50 MG tablet Commonly known as:  ULTRAM TAKE 1 TABLET BY MOUTH EVERY 8 HOURS AS NEEDED FOR SEVERE PAIN      Follow-up Information    Mary Speak, FNP. Schedule an appointment as soon as possible for a visit in  2 week(s).   Specialties:  Family Medicine, Infectious Diseases Contact information: [address] [place] [address] [phone]           Consultations:  None   Procedures/Studies:  Dg Chest 2 View  Result Date: 01/11/2018 CLINICAL DATA:  Shortness of Breath EXAM: CHEST  2 VIEW COMPARISON:  September 28, 2017 FINDINGS: There is no edema or consolidation. The heart size is slightly enlarged with pulmonary vascularity within normal limits. No adenopathy. No bone lesions. IMPRESSION: Slight cardiac enlargement, essentially stable. No edema or consolidation. Electronically Signed   By: Bretta Bang III M.D.   On: 01/11/2018 07:27     Subjective: - no chest pain, shortness of breath, no abdominal pain, nausea or vomiting.   Discharge Exam: Vitals:   01/14/18 0438 01/14/18 0912  BP: (!) 94/59   Pulse: 62   Resp: 20   Temp: (!) 97.5 F (36.4 C)   SpO2: 100% 96%    General:  Pt is alert, awake, not in acute distress Cardiovascular: RRR, S1/S2 +, no rubs, no gallops Respiratory: CTA bilaterally, no wheezing, no rhonchi Abdominal: Soft, NT, ND, bowel sounds + Extremities: no edema, no cyanosis    The results of significant diagnostics from this hospitalization (including imaging, microbiology, ancillary and laboratory) are listed below for reference.     Microbiology: No results found for this or any previous visit (from the past 240 hour(s)).   Labs: BNP (last 3 results) Recent Labs    04/24/17 0950 04/29/17 1900 01/11/18 0526  BNP 234.4* 537.0* 45.9   Basic Metabolic Panel: Recent Labs  Lab 01/11/18 0526 01/12/18 0432  NA 136 132*  K 4.1 4.4  CL 94* 88*  CO2 31 32  GLUCOSE 178* 359*  BUN 7 17  CREATININE 0.59 0.58  CALCIUM 9.1 9.3   Liver Function Tests: No results for input(s): AST, ALT, ALKPHOS, BILITOT, PROT, ALBUMIN in the last 168 hours. No results for input(s): LIPASE, AMYLASE in the last 168 hours. No results for input(s): AMMONIA in the last 168 hours. CBC: Recent Labs  Lab 01/11/18 0526 01/12/18 0432  WBC 6.7 9.2  NEUTROABS 4.3  --   HGB 13.4 12.9  HCT 40.2 39.7  MCV 103.6* 103.4*  PLT 121* 127*   Cardiac Enzymes: No results for input(s): CKTOTAL, CKMB, CKMBINDEX, TROPONINI in the last 168 hours. BNP: Invalid input(s): POCBNP CBG: Recent Labs  Lab 01/13/18 0808 01/13/18 1146 01/13/18 1642 01/13/18 2140 01/14/18 0720  GLUCAP 485* 442* 325* 272* 151*   D-Dimer No results for input(s): DDIMER in the last 72 hours. Hgb A1c No results for input(s): HGBA1C in the last 72 hours. Lipid Profile No results for input(s): CHOL, HDL, LDLCALC, TRIG, CHOLHDL, LDLDIRECT in the last 72 hours. Thyroid function studies No results for input(s): TSH, T4TOTAL, T3FREE, THYROIDAB in the last 72 hours.  Invalid input(s): FREET3 Anemia work up No results for input(s): VITAMINB12, FOLATE, FERRITIN, TIBC, IRON, RETICCTPCT in the  last 72 hours. Urinalysis    Component Value Date/Time   COLORURINE AMBER (A) 09/28/2017 1100   APPEARANCEUR CLOUDY (A) 09/28/2017 1100   LABSPEC 1.030 09/28/2017 1100   PHURINE 6.0 09/28/2017 1100   GLUCOSEU NEGATIVE 09/28/2017 1100   HGBUR NEGATIVE 09/28/2017 1100   BILIRUBINUR SMALL (A) 09/28/2017 1100   KETONESUR NEGATIVE 09/28/2017 1100   PROTEINUR 100 (A) 09/28/2017 1100   UROBILINOGEN 1.0 07/25/2015 1400   NITRITE NEGATIVE 09/28/2017 1100   LEUKOCYTESUR NEGATIVE 09/28/2017 1100   Sepsis Labs  Invalid input(s): PROCALCITONIN,  WBC,  LACTICIDVEN   Time coordinating discharge: < 30 minutes  SIGNED:  Pamella Pertostin Sandy Haye, MD  [facility] 01/14/2018, 3:21 PM Pager [phone]  If 7PM-7AM, please contact night-coverage [email or web address] Password [email or web address]

## 2018-01-14 NOTE — Progress Notes (Signed)
Discharge instructions given. Pt verbalized understanding and all questions were answered.  

## 2018-01-16 ENCOUNTER — Telehealth: Payer: Self-pay | Admitting: Pulmonary Disease

## 2018-01-16 ENCOUNTER — Ambulatory Visit: Payer: Self-pay | Admitting: *Deleted

## 2018-01-16 ENCOUNTER — Telehealth: Payer: Self-pay

## 2018-01-16 NOTE — Telephone Encounter (Signed)
Spoke with [employer] [place] in [place], advised that if pt's O2 is dropping and requiring a higher liter flow than previously prescribed she should be able to bump her flow from 3lpm to 4lpm without an order.  Also advised that we have not seen pt since 05/06/17, and that we would need to see her in clinic to reassess her O2 needs.   Delaney Meigsamara is calling pt regarding another matter, will have pt call our clinic to schedule a HFU.    Will hold message to ensure that HFU is scheduled.

## 2018-01-16 NOTE — Telephone Encounter (Signed)
Patient is calling to report when she got up this morning she felt dizzy- her O2 sat when up was 86. When sitting it it 91. After triage questions- had patient walk again- she reports her reading O2 90- P 102. ( It was 83 when she first put her test on- but recovery was quick) Patient quickly returned to her baseline- but report she did have some slight dizziness. Call to office- they are going to check with provider and call patient with plan for her.  Reason for Disposition . [1] Monitoring respiratory rate AND [2] rise in respiratory rate 4 or more breaths per minute (above known patient baseline, while awake and resting) AND [3] new difficulty breathing or worse than when discharged from hospital  Answer Assessment - Initial Assessment Questions 1. MAIN CONCERN OR SYMPTOM : "What's the main symptom you're worried about?" (e.g., breathing problem, cough, sputum change, wheezing) "What question do you have?"     O2 sat- 86- dizziness 2. ONSET: "When did the  ________  start?"      This morning 3. BETTER-SAME-WORSE: "Are you getting better, staying the same, or getting worse compared to the day you were discharged?"     Still feeling dizzy and weak- worse than when discahrged 4. DISCHARGE DATE: "What date were you discharged from the hospital?"     2/9- Saturday 5. DISCHARGE DOCTOR: "Who is the main doctor taking care of you now?"     COPD- Pulmongist   6. DISCHARGE APPOINTMENT: "Have you scheduled a follow-up discharge appointment with your doctor?"     2/[facility]- [facility] 7. DISCHARGE MEDICATIONS: "Did the doctor who discharged you order any new medicines for you to use? If yes, have you filled the prescription and started taking the medicine?"      Doxycycline and Prenisone 8. OXYGEN THERAPY:    - "Do you currently use home oxygen?" (e.g., yes, no). yes   - If yes, "What is your oxygen source?" (e.g., O2 tank, O2 concentrator). machine   - If yes, "How do you get the oxygen?"  (e.g., nasal prongs, face mask).    - If yes, "How much oxygen are you supposed to use?" (e.g., 1-2 L [place]) 9: MONITORING "Do you monitor/measure your oxygen level or vital signs (e.g., yes, no, measurements are automatically sent to call center). Document current and normal values if available.     -  O2 SAT: "What is the oxygen level (pulse ox reading)?" (e.g., 70-100%) 92%   -  P: "What is your pulse rate per minute?" 87   -  RR: "What is your respiratory rate per minute?"     unknown 10. BREATHING DIFFICULTY: "Are you having any trouble breathing?" If so, ask "How bad is it?"  (e.g., none, mild, moderate, severe)   - MILD: No SOB at rest, mild SOB with walking, speaks normally in sentences, able to lie down, no retractions, pulse < 100.   - MODERATE: SOB at rest, SOB with minimal exertion and prefers to sit, cannot lie down flat, speaks in phrases, mild retractions, audible wheezing, pulse 100-120.   - SEVERE: Very SOB at rest, speaks in single words, struggling to breathe, sitting hunched forward, retractions, pulse > 120        Not sitting- only when up and moving- SOB when gets low 80's- has not had to increase  O2 11. FEVER: "Do you have a fever?" If so, ask: "What is your temperature, how was it measured, and  when did it start?"       no 12. SPUTUM: "Describe the color of your sputum (phlegm)." (clear, white, yellow, green, blood-tinged)       clear 13. OTHER SYMPTOMS: "Do you have any other symptoms?" (e.g., fever)       Strong salty flavor in mouth- had at hospital 14. SMOKING: "Do you smoke currently?"       no  Protocols used: COPD POST-HOSPITALIZATION FOLLOW-UP CALL-A-AH

## 2018-01-16 NOTE — Telephone Encounter (Addendum)
error 

## 2018-01-16 NOTE — Telephone Encounter (Signed)
I attempted to call pt's husband, Casimiro NeedleMichael making sure he was aware of the message per Florentina AddisonKatie but Casimiro NeedleMichael did not answer and no VM has been set up.  Will check pt's chart later to see if pt has been taken to the ED by her husband.

## 2018-01-16 NOTE — Telephone Encounter (Signed)
Pt's husband returned call and I stated the information to him regarding taking pt to the nearest ED to where they live for pt to be further evaluated.  Casimiro NeedleMichael expressed understanding. Nothing further needed at this current time.

## 2018-01-16 NOTE — Telephone Encounter (Signed)
I have talked with ashley/pulmonary and advised of note below from [facility]---per ashley, patient needs to increase her oxygen from 3L to 4L to avoid shortness of breath upon exertion---patient also strongly advised to call pulmonary to make follow up appt for this week per ashley---patient states her husband works and they only have one vehicle--she doesn't really have anyone else that can bring her, but she will call to schedule appt--patient advised she was last seen in June/2018 and needs to have 6 month follow up, strongly encouraged again to find some way to come for office visit so that her COPD can be properly managed--patient states she will call them

## 2018-01-16 NOTE — Telephone Encounter (Signed)
Patient's husband states her O2 low and she is dizzy.  Per Florentina AddisonKatie, told him to take patient to ED.  He stated okay and call then dropped.

## 2018-01-25 ENCOUNTER — Ambulatory Visit: Payer: 59 | Admitting: Nurse Practitioner

## 2018-02-06 ENCOUNTER — Telehealth: Payer: Self-pay

## 2018-02-06 DIAGNOSIS — I270 Primary pulmonary hypertension: Secondary | ICD-10-CM | POA: Diagnosis not present

## 2018-02-06 DIAGNOSIS — G4733 Obstructive sleep apnea (adult) (pediatric): Secondary | ICD-10-CM | POA: Diagnosis not present

## 2018-02-06 DIAGNOSIS — J449 Chronic obstructive pulmonary disease, unspecified: Secondary | ICD-10-CM | POA: Diagnosis not present

## 2018-02-06 NOTE — Telephone Encounter (Signed)
PA for Daliresp 500 mcg on CMM. Approved.    Approvedtoday Request Reference Number: [id number]. DALIRESP TAB 500MCG is approved through 02/07/2019. For further questions, call [phone].   Drug Daliresp 500MCG OR TABS Form [employer] Electronic Prior Authorization Form

## 2018-02-07 ENCOUNTER — Telehealth: Payer: Self-pay

## 2018-02-07 NOTE — Telephone Encounter (Signed)
Copied from CRM [phone]. Topic: Appointment Scheduling - Scheduling Inquiry for Clinic >> Jan 25, 2018 12:21 PM Diana EvesHoyt, Maryann B wrote: Reason for CRM: pt is [facility]. Her appt was canceled on 01/25/18 due to provider being out of office. Pt hoping to be worked in with her with in asap  >> Feb 06, 2018  2:48 PM Cipriano BunkerLambe, Annette S wrote: She is asking for someone call her for appt.   She is needing medications refilled and Doctor was sick at her last appt.     >> Feb 06, 2018  2:56 PM Morphies, Hermine MessickCarson J wrote: Is there anywhere that we can work her in?

## 2018-02-07 NOTE — Telephone Encounter (Signed)
Tried reaching out to patient to get an appointment set up. The number listed was disconnected.

## 2018-02-08 ENCOUNTER — Emergency Department (HOSPITAL_COMMUNITY): Payer: 59

## 2018-02-08 ENCOUNTER — Encounter: Payer: Self-pay | Admitting: Family

## 2018-02-08 ENCOUNTER — Other Ambulatory Visit: Payer: Self-pay

## 2018-02-08 ENCOUNTER — Observation Stay (HOSPITAL_COMMUNITY)
Admission: EM | Admit: 2018-02-08 | Discharge: 2018-02-10 | DRG: 190 | Disposition: A | Discharge status: Home / Self Care | Payer: 59 | Attending: Internal Medicine | Admitting: Internal Medicine

## 2018-02-08 ENCOUNTER — Ambulatory Visit (INDEPENDENT_AMBULATORY_CARE_PROVIDER_SITE_OTHER): Payer: 59 | Admitting: Family

## 2018-02-08 ENCOUNTER — Encounter (HOSPITAL_COMMUNITY): Payer: Self-pay | Admitting: Family Medicine

## 2018-02-08 VITALS — BP 108/60 | HR 94 | Temp 98.8°F | Ht 63.0 in | Wt 202.1 lb

## 2018-02-08 DIAGNOSIS — E662 Morbid (severe) obesity with alveolar hypoventilation: Secondary | ICD-10-CM | POA: Diagnosis present

## 2018-02-08 DIAGNOSIS — I2729 Other secondary pulmonary hypertension: Secondary | ICD-10-CM | POA: Diagnosis present

## 2018-02-08 DIAGNOSIS — I4581 Long QT syndrome: Secondary | ICD-10-CM | POA: Diagnosis present

## 2018-02-08 DIAGNOSIS — G4733 Obstructive sleep apnea (adult) (pediatric): Secondary | ICD-10-CM | POA: Diagnosis present

## 2018-02-08 DIAGNOSIS — T380X5A Adverse effect of glucocorticoids and synthetic analogues, initial encounter: Secondary | ICD-10-CM | POA: Diagnosis present

## 2018-02-08 DIAGNOSIS — J449 Chronic obstructive pulmonary disease, unspecified: Secondary | ICD-10-CM | POA: Diagnosis not present

## 2018-02-08 DIAGNOSIS — J9611 Chronic respiratory failure with hypoxia: Secondary | ICD-10-CM | POA: Diagnosis present

## 2018-02-08 DIAGNOSIS — J189 Pneumonia, unspecified organism: Secondary | ICD-10-CM | POA: Diagnosis present

## 2018-02-08 DIAGNOSIS — E1165 Type 2 diabetes mellitus with hyperglycemia: Secondary | ICD-10-CM | POA: Diagnosis not present

## 2018-02-08 DIAGNOSIS — E876 Hypokalemia: Secondary | ICD-10-CM | POA: Diagnosis present

## 2018-02-08 DIAGNOSIS — Z7951 Long term (current) use of inhaled steroids: Secondary | ICD-10-CM

## 2018-02-08 DIAGNOSIS — E1169 Type 2 diabetes mellitus with other specified complication: Secondary | ICD-10-CM | POA: Diagnosis present

## 2018-02-08 DIAGNOSIS — I2781 Cor pulmonale (chronic): Secondary | ICD-10-CM | POA: Diagnosis not present

## 2018-02-08 DIAGNOSIS — J9612 Chronic respiratory failure with hypercapnia: Secondary | ICD-10-CM | POA: Diagnosis present

## 2018-02-08 DIAGNOSIS — Z9981 Dependence on supplemental oxygen: Secondary | ICD-10-CM | POA: Diagnosis not present

## 2018-02-08 DIAGNOSIS — Z6836 Body mass index (BMI) 36.0-36.9, adult: Secondary | ICD-10-CM

## 2018-02-08 DIAGNOSIS — J441 Chronic obstructive pulmonary disease with (acute) exacerbation: Secondary | ICD-10-CM | POA: Diagnosis not present

## 2018-02-08 DIAGNOSIS — I272 Pulmonary hypertension, unspecified: Secondary | ICD-10-CM | POA: Diagnosis present

## 2018-02-08 DIAGNOSIS — F17210 Nicotine dependence, cigarettes, uncomplicated: Secondary | ICD-10-CM | POA: Diagnosis not present

## 2018-02-08 DIAGNOSIS — R0602 Shortness of breath: Secondary | ICD-10-CM | POA: Diagnosis not present

## 2018-02-08 DIAGNOSIS — I50810 Right heart failure, unspecified: Secondary | ICD-10-CM | POA: Diagnosis not present

## 2018-02-08 DIAGNOSIS — I700 Atherosclerosis of aorta: Secondary | ICD-10-CM | POA: Diagnosis not present

## 2018-02-08 DIAGNOSIS — Z79899 Other long term (current) drug therapy: Secondary | ICD-10-CM | POA: Diagnosis not present

## 2018-02-08 DIAGNOSIS — I270 Primary pulmonary hypertension: Secondary | ICD-10-CM | POA: Diagnosis not present

## 2018-02-08 DIAGNOSIS — R05 Cough: Secondary | ICD-10-CM | POA: Diagnosis not present

## 2018-02-08 DIAGNOSIS — K7460 Unspecified cirrhosis of liver: Secondary | ICD-10-CM | POA: Diagnosis not present

## 2018-02-08 DIAGNOSIS — Z794 Long term (current) use of insulin: Secondary | ICD-10-CM | POA: Diagnosis not present

## 2018-02-08 DIAGNOSIS — Z91030 Bee allergy status: Secondary | ICD-10-CM

## 2018-02-08 DIAGNOSIS — R9431 Abnormal electrocardiogram [ECG] [EKG]: Secondary | ICD-10-CM | POA: Diagnosis present

## 2018-02-08 DIAGNOSIS — J440 Chronic obstructive pulmonary disease with acute lower respiratory infection: Secondary | ICD-10-CM | POA: Diagnosis not present

## 2018-02-08 DIAGNOSIS — Z86711 Personal history of pulmonary embolism: Secondary | ICD-10-CM

## 2018-02-08 DIAGNOSIS — Z86718 Personal history of other venous thrombosis and embolism: Secondary | ICD-10-CM

## 2018-02-08 DIAGNOSIS — R0789 Other chest pain: Secondary | ICD-10-CM | POA: Diagnosis not present

## 2018-02-08 DIAGNOSIS — R0600 Dyspnea, unspecified: Secondary | ICD-10-CM | POA: Diagnosis not present

## 2018-02-08 DIAGNOSIS — Z9989 Dependence on other enabling machines and devices: Secondary | ICD-10-CM

## 2018-02-08 DIAGNOSIS — E669 Obesity, unspecified: Secondary | ICD-10-CM

## 2018-02-08 LAB — COMPREHENSIVE METABOLIC PANEL
ALBUMIN: 3.9 g/dL (ref 3.5–5.0)
ALK PHOS: 139 U/L — AB (ref 38–126)
ALT: 52 U/L (ref 14–54)
AST: 66 U/L — AB (ref 15–41)
Anion gap: 10 (ref 5–15)
BILIRUBIN TOTAL: 1.7 mg/dL — AB (ref 0.3–1.2)
BUN: 7 mg/dL (ref 6–20)
CALCIUM: 9.5 mg/dL (ref 8.9–10.3)
CO2: 34 mmol/L — ABNORMAL HIGH (ref 22–32)
Chloride: 95 mmol/L — ABNORMAL LOW (ref 101–111)
Creatinine, Ser: 0.47 mg/dL (ref 0.44–1.00)
GFR calc Af Amer: >60 mL/min (ref >60)
GFR calc non Af Amer: >60 mL/min (ref >60)
GLUCOSE: 137 mg/dL — AB (ref 65–99)
POTASSIUM: 3.6 mmol/L (ref 3.5–5.1)
SODIUM: 139 mmol/L (ref 135–145)
TOTAL PROTEIN: 8.2 g/dL — AB (ref 6.5–8.1)

## 2018-02-08 LAB — CBC WITH DIFFERENTIAL/PLATELET
BASOS ABS: 0 10*3/uL (ref 0.0–0.1)
Basophils Relative: 0 %
EOS PCT: 2 %
Eosinophils Absolute: 0.2 10*3/uL (ref 0.0–0.7)
HEMATOCRIT: 39.1 % (ref 36.0–46.0)
Hemoglobin: 12.8 g/dL (ref 12.0–15.0)
LYMPHS PCT: 15 %
Lymphs Abs: 1.5 10*3/uL (ref 0.7–4.0)
MCH: 34.1 pg — ABNORMAL HIGH (ref 26.0–34.0)
MCHC: 32.7 g/dL (ref 30.0–36.0)
MCV: 104.3 fL — AB (ref 78.0–100.0)
MONO ABS: 0.8 10*3/uL (ref 0.1–1.0)
Monocytes Relative: 8 %
Neutro Abs: 7.2 10*3/uL (ref 1.7–7.7)
Neutrophils Relative %: 75 %
PLATELETS: 136 10*3/uL — AB (ref 150–400)
RBC: 3.75 MIL/uL — ABNORMAL LOW (ref 3.87–5.11)
RDW: 14.2 % (ref 11.5–15.5)
WBC: 9.6 10*3/uL (ref 4.0–10.5)

## 2018-02-08 LAB — I-STAT TROPONIN, ED: Troponin i, poc: 0 ng/mL (ref 0.00–0.08)

## 2018-02-08 LAB — I-STAT BETA HCG BLOOD, ED (MC, WL, AP ONLY): I-stat hCG, quantitative: <5 m[IU]/mL (ref <5)

## 2018-02-08 LAB — TROPONIN I: Troponin I: <0.03 ng/mL

## 2018-02-08 LAB — GLUCOSE, CAPILLARY: GLUCOSE-CAPILLARY: 359 mg/dL — AB (ref 65–99)

## 2018-02-08 LAB — BRAIN NATRIURETIC PEPTIDE: B Natriuretic Peptide: 40.9 pg/mL (ref 0.0–100.0)

## 2018-02-08 LAB — INFLUENZA PANEL BY PCR (TYPE A & B)
Influenza A By PCR: NEGATIVE
Influenza B By PCR: NEGATIVE

## 2018-02-08 LAB — MAGNESIUM: Magnesium: 1.7 mg/dL (ref 1.7–2.4)

## 2018-02-08 MED ORDER — POTASSIUM CHLORIDE CRYS ER 20 MEQ PO TBCR
20.0000 meq | EXTENDED_RELEASE_TABLET | Freq: Every day | ORAL | Status: DC
  Administered 2018-02-09: 20 meq via ORAL
  Filled 2018-02-08 (×3): qty 1

## 2018-02-08 MED ORDER — SODIUM CHLORIDE 0.9 % IJ SOLN
INTRAMUSCULAR | Status: AC
  Filled 2018-02-08: qty 50

## 2018-02-08 MED ORDER — INSULIN ASPART 100 UNIT/ML ~~LOC~~ SOLN
0.0000 [IU] | Freq: Three times a day (TID) | SUBCUTANEOUS | Status: DC
  Administered 2018-02-09: 9 [IU] via SUBCUTANEOUS
  Administered 2018-02-09: 7 [IU] via SUBCUTANEOUS
  Administered 2018-02-09: 9 [IU] via SUBCUTANEOUS
  Administered 2018-02-10: 3 [IU] via SUBCUTANEOUS
  Administered 2018-02-10: 2 [IU] via SUBCUTANEOUS

## 2018-02-08 MED ORDER — SODIUM CHLORIDE 0.9 % IV SOLN
1.0000 g | INTRAVENOUS | Status: DC
  Administered 2018-02-09: 1 g via INTRAVENOUS
  Filled 2018-02-08: qty 1

## 2018-02-08 MED ORDER — ORAL CARE MOUTH RINSE
15.0000 mL | Freq: Two times a day (BID) | OROMUCOSAL | Status: DC
  Administered 2018-02-09: 15 mL via OROMUCOSAL

## 2018-02-08 MED ORDER — SODIUM CHLORIDE 0.9 % IV SOLN
1.0000 g | Freq: Once | INTRAVENOUS | Status: AC
  Administered 2018-02-08: 1 g via INTRAVENOUS
  Filled 2018-02-08: qty 10

## 2018-02-08 MED ORDER — BUDESONIDE 0.25 MG/2ML IN SUSP
0.2500 mg | Freq: Two times a day (BID) | RESPIRATORY_TRACT | Status: DC
  Administered 2018-02-09 – 2018-02-10 (×4): 0.25 mg via RESPIRATORY_TRACT
  Filled 2018-02-08 (×4): qty 2

## 2018-02-08 MED ORDER — SODIUM CHLORIDE 0.9 % IV SOLN: 1.0000 g | Freq: Once | INTRAVENOUS | Status: DC

## 2018-02-08 MED ORDER — IPRATROPIUM-ALBUTEROL 0.5-2.5 (3) MG/3ML IN SOLN
3.0000 mL | Freq: Four times a day (QID) | RESPIRATORY_TRACT | Status: DC
  Administered 2018-02-09 – 2018-02-10 (×5): 3 mL via RESPIRATORY_TRACT
  Filled 2018-02-08 (×6): qty 3

## 2018-02-08 MED ORDER — ONDANSETRON HCL 4 MG PO TABS: 4.0000 mg | ORAL_TABLET | Freq: Four times a day (QID) | ORAL | Status: DC | PRN

## 2018-02-08 MED ORDER — INSULIN GLARGINE 100 UNIT/ML ~~LOC~~ SOLN
10.0000 [IU] | Freq: Every day | SUBCUTANEOUS | Status: DC
  Administered 2018-02-08 – 2018-02-09 (×2): 10 [IU] via SUBCUTANEOUS
  Filled 2018-02-08 (×3): qty 0.1

## 2018-02-08 MED ORDER — IOPAMIDOL (ISOVUE-370) INJECTION 76%
INTRAVENOUS | Status: AC
  Administered 2018-02-08: 100 mL
  Filled 2018-02-08: qty 100

## 2018-02-08 MED ORDER — SENNOSIDES-DOCUSATE SODIUM 8.6-50 MG PO TABS: 1.0000 | ORAL_TABLET | Freq: Every evening | ORAL | Status: DC | PRN

## 2018-02-08 MED ORDER — POTASSIUM CHLORIDE CRYS ER 20 MEQ PO TBCR: 40.0000 meq | EXTENDED_RELEASE_TABLET | Freq: Once | ORAL | Status: DC

## 2018-02-08 MED ORDER — PREGABALIN 50 MG PO CAPS
50.0000 mg | ORAL_CAPSULE | Freq: Two times a day (BID) | ORAL | Status: DC
  Administered 2018-02-08 – 2018-02-09 (×2): 50 mg via ORAL
  Filled 2018-02-08 (×2): qty 1

## 2018-02-08 MED ORDER — TRAMADOL HCL 50 MG PO TABS
50.0000 mg | ORAL_TABLET | Freq: Three times a day (TID) | ORAL | Status: DC | PRN
  Administered 2018-02-08 – 2018-02-10 (×3): 50 mg via ORAL
  Filled 2018-02-08 (×3): qty 1

## 2018-02-08 MED ORDER — CHLORHEXIDINE GLUCONATE 0.12 % MT SOLN
15.0000 mL | Freq: Two times a day (BID) | OROMUCOSAL | Status: DC
  Administered 2018-02-09 – 2018-02-10 (×2): 15 mL via OROMUCOSAL
  Filled 2018-02-08 (×3): qty 15

## 2018-02-08 MED ORDER — SODIUM CHLORIDE 0.9 % IV SOLN
INTRAVENOUS | Status: DC
  Administered 2018-02-08: 19:00:00 via INTRAVENOUS

## 2018-02-08 MED ORDER — TORSEMIDE 20 MG PO TABS
20.0000 mg | ORAL_TABLET | Freq: Every day | ORAL | Status: DC
  Administered 2018-02-09: 20 mg via ORAL
  Filled 2018-02-08 (×2): qty 1

## 2018-02-08 MED ORDER — DOXYCYCLINE HYCLATE 100 MG PO TABS
100.0000 mg | ORAL_TABLET | Freq: Once | ORAL | Status: AC
  Administered 2018-02-08: 100 mg via ORAL
  Filled 2018-02-08: qty 1

## 2018-02-08 MED ORDER — SODIUM CHLORIDE 0.9 % IV SOLN
100.0000 mg | Freq: Two times a day (BID) | INTRAVENOUS | Status: DC
  Administered 2018-02-09 – 2018-02-10 (×3): 100 mg via INTRAVENOUS
  Filled 2018-02-08 (×3): qty 100

## 2018-02-08 MED ORDER — ONDANSETRON HCL 4 MG/2ML IJ SOLN
4.0000 mg | Freq: Four times a day (QID) | INTRAMUSCULAR | Status: DC | PRN
Start: 2018-02-08 — End: 2018-02-10

## 2018-02-08 MED ORDER — SODIUM CHLORIDE 0.9 % IV SOLN
500.0000 mg | Freq: Once | INTRAVENOUS | Status: DC
  Filled 2018-02-08: qty 500

## 2018-02-08 MED ORDER — ALBUTEROL (5 MG/ML) CONTINUOUS INHALATION SOLN
10.0000 mg/h | INHALATION_SOLUTION | RESPIRATORY_TRACT | Status: DC
  Administered 2018-02-08: 10 mg/h via RESPIRATORY_TRACT
  Filled 2018-02-08: qty 20

## 2018-02-08 MED ORDER — METHYLPREDNISOLONE SODIUM SUCC 125 MG IJ SOLR
125.0000 mg | Freq: Once | INTRAMUSCULAR | Status: AC
  Administered 2018-02-08: 125 mg via INTRAVENOUS
  Filled 2018-02-08: qty 2

## 2018-02-08 MED ORDER — IPRATROPIUM-ALBUTEROL 0.5-2.5 (3) MG/3ML IN SOLN
3.0000 mL | Freq: Once | RESPIRATORY_TRACT | Status: AC
  Administered 2018-02-08: 3 mL via RESPIRATORY_TRACT
  Filled 2018-02-08: qty 3

## 2018-02-08 MED ORDER — SODIUM CHLORIDE 0.9 % IV BOLUS (SEPSIS)
1000.0000 mL | Freq: Once | INTRAVENOUS | Status: DC
  Administered 2018-02-08: 1000 mL via INTRAVENOUS

## 2018-02-08 MED ORDER — GUAIFENESIN-CODEINE 100-10 MG/5ML PO SOLN
5.0000 mL | ORAL | Status: DC | PRN
  Administered 2018-02-09 – 2018-02-10 (×3): 5 mL via ORAL
  Filled 2018-02-08 (×4): qty 5

## 2018-02-08 MED ORDER — PANTOPRAZOLE SODIUM 40 MG PO TBEC
40.0000 mg | DELAYED_RELEASE_TABLET | Freq: Two times a day (BID) | ORAL | Status: DC
  Administered 2018-02-09 – 2018-02-10 (×3): 40 mg via ORAL
  Filled 2018-02-08 (×3): qty 1

## 2018-02-08 MED ORDER — ENOXAPARIN SODIUM 40 MG/0.4ML ~~LOC~~ SOLN
40.0000 mg | Freq: Every day | SUBCUTANEOUS | Status: DC
  Administered 2018-02-08 – 2018-02-09 (×2): 40 mg via SUBCUTANEOUS
  Filled 2018-02-08 (×2): qty 0.4

## 2018-02-08 MED ORDER — PREDNISONE 50 MG PO TABS
50.0000 mg | ORAL_TABLET | Freq: Every day | ORAL | Status: DC
  Administered 2018-02-09 – 2018-02-10 (×2): 50 mg via ORAL
  Filled 2018-02-08 (×2): qty 1

## 2018-02-08 NOTE — ED Notes (Signed)
Bed: WA04 Expected date:  Expected time:  Means of arrival:  Comments: 

## 2018-02-08 NOTE — ED Notes (Signed)
Admitting Provider at bedside. 

## 2018-02-08 NOTE — H&P (Signed)
History and Physical    Mary [name] UJW:[id number] DOB: [birth year] DOA: 02/08/2018  PCP: System, Pcp Not In   Patient coming from: Home  Chief Complaint: SOB, cough  HPI: Mary [name] is a 42 y.o. female with medical history significant for right-sided CHF, OSA and ohs, DM 2, COPD chronic respiratory failure, on 3 L O2.  Patient presented to the ED with complaints of shortness of breath and cough 2 days duration.  Shortness of breath is significantly worse with exertion.  Cough is productive of clear thick sputum.  Patient endorses sweats, subjective fevers and chills, with wheezing/rattling breathing sounds.  Patient reports midsternal radiating chest pain that she feels is 2/2 coughing. Patient denies sick contacts.  No myalgias or sore throat.  Father had angioplasty in his 39s-50s. Patient reports her O2 sats were in the 60s yesterday and she could only get it up with BiPAP.  She uses BiPAP when sleeping.  Patient still smokes cigarettes. She then saw her PCP today for these complaints and was sent to the ED for evaluation.  ED Course: Heart rate initially normal increased to 110 in ED, O2 sats greater than 92% on 3L O2 [place].  Patient was given BDs, IV Solu-Medrol 125, EKG showed prolonged QTc at 500, BNP 40.  Flu negative.  I-STAT troponin negative. CTA chest-posterior segment right upper lobe airspace opacity-small focus of pneumonia alveolitis or pneumonitis.  No PE. It was started on IV ceftriaxone, IV azithromycin later changed to IV doxycycline as patient's QTC is prolonged.  [facility] was called to admit for COPD exacerbation and CAP.  Review of Systems: As per HPI otherwise 10 point review of systems negative.   Past Medical History:  Diagnosis Date  . Axillary adenopathy    right axillary adenopathy noted on CT chest (03/06/2012)  . Chronic right-sided CHF (congestive heart failure) (HCC) 03/23/2012   with cor pulmonale. Last RHC 03/2012  . COPD (chronic obstructive  pulmonary disease) (HCC)    PFTS 03/08/12: fev1 0.58L/1%, FVC 1.18/33%, Ratop 49 and c/w ssevere obstruction. 21% BD response on FVC, RV 219%, DLCO 11/54%  . DVT of upper extremity (deep vein thrombosis) [facility]) April 2013   right subclavian // Unclear precipitating cause - possibly significant right heart failure, with vascular stasis potentially predisposing to clotting.    . Exertional shortness of breath   . Hepatic cirrhosis (HCC)    Questionable history of - Noted on CT abdomen (03/2012) - thought to be due to vascular congestion from right heart failure +/- patient's history of alcohol abuse  . History of alcoholism (HCC)   . History of chronic bronchitis   . Irregular menses   . Migraine    "~ 1/yr" (05/03/2013)  . Moderate to severe pulmonary hypertension [facility]) April 2013   Cardiac cath on 03/06/12 - 1. Elevated pulmonary artery pressures, right sided filling pressures.,  2. PA: 64/45 (mean 53)    . On home oxygen therapy    "2-3 L 24/7" (05/03/2013)  . OSA (obstructive sleep apnea)    wears noctural BiPAP (05/03/2013)  . Periodontal disease   . Pneumonia ~ 1985; 01/2011  . Pulmonary embolus [facility]) April 2013   Precipitating cause unclear. Was treated with coumadin from April-June 2013.  . Pulmonary hypertension (HCC)     Past Surgical History:  Procedure Laterality Date  . APPENDECTOMY  05/03/2013  . CARDIAC CATHETERIZATION  03/2012  . CARDIAC CATHETERIZATION N/A 01/08/2016   Procedure: Right Heart Cath;  Surgeon: Bevelyn Buckles  Bensimhon, MD;  Location: [facility] INVASIVE CV LAB;  Service: Cardiovascular;  Laterality: N/A;  . CARDIAC SURGERY  16-Nov-1977   "my heart was backwards" (05/03/2013)  . LAPAROSCOPIC APPENDECTOMY N/A 05/03/2013   Procedure: APPENDECTOMY LAPAROSCOPIC;  Surgeon: Cherylynn Ridges, MD;  Location: [facility] OR;  Service: General;  Laterality: N/A;  . LUNG SURGERY  October 20, 1977  . RIGHT HEART CATHETERIZATION N/A 03/07/2012   Procedure: RIGHT HEART CATH;  Surgeon: Kathleene Hazel, MD;   Location: [facility] CATH LAB;  Service: Cardiovascular;  Laterality: N/A;     reports that she quit smoking about 4 weeks ago. Her smoking use included cigarettes. She has a 6.25 pack-year smoking history. she has never used smokeless tobacco. She reports that she drinks about 0.6 oz of alcohol per week. She reports that she does not use drugs.  No Known Allergies  Family History  Problem Relation Age of Onset  . Hypertension Father   . Heart disease Father        CHF; died age 69   . Alcohol abuse Father   . Other Brother        died age 29 y.o overdose   Prior to Admission medications   Medication Sig Start Date End Date Taking? Authorizing Provider  albuterol (PROVENTIL HFA;VENTOLIN HFA) 108 (90 BASE) MCG/ACT inhaler Inhale 2 puffs into the lungs every 6 (six) hours as needed for wheezing or shortness of breath. 09/06/14  Yes Kalman Shan, MD  albuterol (VENTOLIN HFA) 108 (90 Base) MCG/ACT inhaler USE 2 PUFFS EVERY 6 HOURS AS NEEDED FOR WHEEZING 12/13/17  Yes Kalman Shan, MD  budesonide-formoterol (SYMBICORT) 160-4.5 MCG/ACT inhaler Inhale 2 puffs into the lungs 2 (two) times daily. Patient taking differently: Inhale 2 puffs into the lungs daily.  03/28/17  Yes Simonne Martinet, NP  diclofenac sodium (VOLTAREN) 1 % GEL Apply 2 g topically 3 (three) times daily. 09/28/17  Yes Santos-Sanchez, Chelsea Primus, MD  gabapentin (NEURONTIN) 300 MG capsule Take 2 capsules (600 mg total) by mouth 3 (three) times daily. 01/09/18  Yes Evaristo Bury, NP  Insulin Glargine (BASAGLAR KWIKPEN) 100 UNIT/ML SOPN Inject 0.15 mLs (15 Units total) into the skin daily as needed (BS). 01/14/18  Yes Gherghe, Daylene Katayama, MD  insulin lispro (HUMALOG) 100 UNIT/ML injection Inject 0-0.15 mLs (0-15 Units total) into the skin as needed. If BS >200 use 8 units  CBG < 70: Eat or drink something right away and recheck. CBG 70 - 120: 0 units CBG 121 - 150: 2 units CBG 151 - 200: 3 units CBG 201 - 250: 5 units CBG 251 -  300: 8 units CBG 301 - 350: 11 units CBG 351 - 400: 15 units CBG > 400: call MD. 01/14/18  Yes Leatha Gilding, MD  pantoprazole (PROTONIX) 40 MG tablet Take 1 tablet (40 mg total) by mouth 2 (two) times daily before a meal. 07/22/16  Yes Jeralyn Bennett, MD  potassium chloride SA (K-DUR,KLOR-CON) 20 MEQ tablet Take 1 tablet (20 mEq total) by mouth daily. 01/29/16  Yes Bensimhon, Bevelyn Buckles, MD  pregabalin (LYRICA) 50 MG capsule Take 1 capsule (50 mg total) by mouth 2 (two) times daily. 10/31/17  Yes Plotnikov, Georgina Quint, MD  roflumilast (DALIRESP) 500 MCG TABS tablet Take 500 mcg by mouth daily.   Yes [provider]  SPIRIVA RESPIMAT 1.25 MCG/ACT AERS INHALE 2 PUFFS INTO THE LUNGS DAILY. 09/27/17  Yes Kalman Shan, MD  torsemide (DEMADEX) 20 MG tablet Take 1 tablet (20 mg total)  by mouth daily. 12/08/17  Yes Shambley, Audie Box, NP  traMADol (ULTRAM) 50 MG tablet TAKE 1 TABLET BY MOUTH EVERY 8 HOURS AS NEEDED FOR SEVERE PAIN 10/31/17  Yes Plotnikov, Georgina Quint, MD  doxycycline (VIBRA-TABS) 100 MG tablet Take 1 tablet (100 mg total) by mouth every 12 (twelve) hours. Patient not taking: Reported on 02/08/2018 01/14/18   Leatha Gilding, MD  guaiFENesin (MUCINEX) 600 MG 12 hr tablet Take 1 tablet (600 mg total) by mouth 2 (two) times daily. Patient not taking: Reported on 02/08/2018 02/11/17   Vassie Loll, MD  predniSONE (DELTASONE) 10 MG tablet Take 4 tablets (40 mg total) by mouth daily. 4 tablets daily x 3 days then 3 daily x 3 days then 2 daily x 3 days then 1 daily x 3 days Patient not taking: Reported on 02/08/2018 01/14/18   Leatha Gilding, MD    Physical Exam: Vitals:   02/08/18 1525 02/08/18 1530 02/08/18 1600 02/08/18 1715  BP:  120/80 109/86 104/69  Pulse:  89 88 (!) 110  Resp:  18 16 (!) 29  Temp:      TempSrc:      SpO2: (S) 99% 95% 98% 92%  Weight:      Height:        Constitutional: NAD, calm, comfortable Vitals:   02/08/18 1525 02/08/18 1530 02/08/18 1600  02/08/18 1715  BP:  120/80 109/86 104/69  Pulse:  89 88 (!) 110  Resp:  18 16 (!) 29  Temp:      TempSrc:      SpO2: (S) 99% 95% 98% 92%  Weight:      Height:       Eyes: PERRL, lids and conjunctivae normal ENMT: Mucous membranes are moist. Posterior pharynx clear of any exudate or lesions.Normal dentition.  Neck: normal, supple, no masses, no thyromegaly Respiratory: on 3L [place], reduced air entry left upper lung zone, but no wheezing or crackles appreciated. Normal respiratory effort. No accessory muscle use.  Cardiovascular: Mild tachycardia but regular rate and rhythm, no murmurs / rubs / gallops. No extremity edema. 2+ pedal pulses.  Chest pain reproducible on palpation Abdomen: no tenderness, no masses palpated. No hepatosplenomegaly. Bowel sounds positive.  Musculoskeletal: no clubbing / cyanosis. No joint deformity upper and lower extremities. Good ROM, no contractures. Normal muscle tone.  Skin: no rashes, lesions, ulcers. No induration Neurologic: CN 2-12 grossly intact. Strength 5/5 in all 4.  Psychiatric: Normal judgment and insight. Alert and oriented x 3. Normal mood.   Labs on Admission: I have personally reviewed following labs and imaging studies  CBC: Recent Labs  Lab 02/08/18 1616  WBC 9.6  NEUTROABS 7.2  HGB 12.8  HCT 39.1  MCV 104.3*  PLT 136*   Basic Metabolic Panel: Recent Labs  Lab 02/08/18 1616  NA 139  K 3.6  CL 95*  CO2 34*  GLUCOSE 137*  BUN 7  CREATININE 0.47  CALCIUM 9.5   Liver Function Tests: Recent Labs  Lab 02/08/18 1616  AST 66*  ALT 52  ALKPHOS 139*  BILITOT 1.7*  PROT 8.2*  ALBUMIN 3.9   BNP (last 3 results) Recent Labs    04/22/17 1153  PROBNP 129.0*   Urine analysis:    Component Value Date/Time   COLORURINE AMBER (A) 09/28/2017 1100   APPEARANCEUR CLOUDY (A) 09/28/2017 1100   LABSPEC 1.030 09/28/2017 1100   PHURINE 6.0 09/28/2017 1100   GLUCOSEU NEGATIVE 09/28/2017 1100   HGBUR NEGATIVE 09/28/2017 1100  BILIRUBINUR SMALL (A) 09/28/2017 1100   KETONESUR NEGATIVE 09/28/2017 1100   PROTEINUR 100 (A) 09/28/2017 1100   UROBILINOGEN 1.0 07/25/2015 1400   NITRITE NEGATIVE 09/28/2017 1100   LEUKOCYTESUR NEGATIVE 09/28/2017 1100    Radiological Exams on Admission: Dg Chest 2 View  Result Date: 02/08/2018 CLINICAL DATA:  Shortness of breath and cough.  History of COPD. EXAM: CHEST - 2 VIEW COMPARISON:  Chest x-ray dated January 11, 2018. FINDINGS: Stable mild cardiomegaly. Normal pulmonary vascularity. Bibasilar atelectasis. No focal consolidation, pleural effusion, or pneumothorax. No acute osseous abnormality. IMPRESSION: No active cardiopulmonary disease. Electronically Signed   By: Obie Dredge M.D.   On: 02/08/2018 14:44   Ct Angio Chest Pe W/cm &/or Wo Cm  Result Date: 02/08/2018 CLINICAL DATA:  Dyspnea x2 days. Productive cough and chest pain from the cough. No fever. EXAM: CT ANGIOGRAPHY CHEST WITH CONTRAST TECHNIQUE: Multidetector CT imaging of the chest was performed using the standard protocol during bolus administration of intravenous contrast. Multiplanar CT image reconstructions and MIPs were obtained to evaluate the vascular anatomy. CONTRAST:  ISOVUE-370 IOPAMIDOL (ISOVUE-370) INJECTION 76% COMPARISON:  CXR 02/08/2018, chest CT 04/30/2017 FINDINGS: Adequate opacification of the central pulmonary arteries. Limited assessment at the segmental and subsegmental levels due to motion artifacts and timing of contrast. Cardiovascular: No large central pulmonary embolus noted. Normal branch pattern of the great vessels. Limited assessment of the ascending aorta due to motion artifacts. No definite dissection or aneurysm. Mild dilatation of the main pulmonary artery to 3.5 cm compatible with chronic pulmonary hypertension. Minimal coronary arteriosclerosis. Borderline cardiomegaly. No pericardial effusion. Mediastinum/Nodes: No enlarged mediastinal, hilar, or axillary lymph nodes. Thyroid gland,  trachea, and esophagus demonstrate no significant findings. Lungs/Pleura: Respiratory motion artifacts limit assessment at the lung bases. Posterior segment right upper lobe airspace opacity is noted which may reflect a small focus of pneumonia, alveolitis or pneumonitis among some possibilities though not exclusive. Linear opacity noted in the right lower lobe, new since prior and may reflect postinfectious or postinflammatory change as well versus atelectasis or scarring. Subpleural areas of atelectasis and/or scarring are noted in both upper lobes, lingula and left lower lobe. Upper Abdomen: Hepatic steatosis is noted. Musculoskeletal: No acute or significant osseous findings. Review of the MIP images confirms the above findings. IMPRESSION: 1. No large central pulmonary embolus. Evaluation of the lower lobe pulmonary parenchyma as well as segmental and subsegmental pulmonary arteries are limited due to motion artifacts. 2. Minimal coronary arteriosclerosis and aortic atherosclerosis. 3. Mild dilatation of the main pulmonary artery likely reflecting chronic pulmonary hypertension. 4. Hepatic steatosis. Aortic Atherosclerosis (ICD10-I70.0). Electronically Signed   By: Tollie Eth M.D.   On: 02/08/2018 18:01    EKG: QTC prolonged 500.  Otherwise unchanged from prior.  Assessment/Plan Principal Problem:   Acute exacerbation of chronic obstructive pulmonary disease (COPD) (HCC) Active Problems:   OSA on BiPAP   Cor pulmonale (HCC)   Pulmonary hypertension (HCC)   Obesity hypoventilation syndrome (HCC)   Diabetes mellitus type 2 in obese (HCC)   Chronic respiratory failure with hypoxia and hypercapnia (HCC)   Prolonged QT interval   COPD exacerbation with  CAP- wheezing, productive cough, SOB. Chronic 3L O2.  Serum bicarb- 34 suggesting chronic O2 retention. flu neg. IV solumedrol 125 given in ED. WBC- 9.6. Chest x-ray clear but CTA chest suggest small PNA. IV ceftria and doxycycline ( QTc prolonged)  given in ED. But recent hospital admission- 2/6- 2/9 for COPD.   -Will Continue with PO  steroids 50mg  daily ( patient diabetic and wheezing improved) -Will continue IV ceftriaxone and doxycycline, as Pt feels better, not ill appearing, no increase in O2 requirements from baseline, no WBC, no fever, imaging findings not impressive for PNA, typical HCAP Coverage would be overkill at this time. Tachycardia likely from [place]od[place], otherwise stable vitals. - Can escalate antibiotics pending clinical course - BiPAP PRN - Mucolytics -Continue home steroid inhalers  Chest Pain-likely musculoskeletal, from coughing.  Reproducible chest pain on palpation. i-STAT troponin negative, EKG essentially unchanged. -EKG a.m. -Trend troponins  Prolonged QTC- K- 3.6.  On QT prolonging medication -Give 40KCL X1, then cont home Kdur 20 daily. - Check Mag- 1.7 - EKG a.m  Tobacco abuse-current, but says no cigs in 2 weeks.  DM- Last Hgba1c- 6.8 04/24/2017. -Continue home Lantus at reduced dose 10 units daily - SSI - CBG checks, pt on steroids  Right sided CHF, pulmonary hypertension-last echo 02/01/2017 EF 65-70%, G1DD.  PA pressure 42. - D/c IVF started in ED -Continue home torsemide 20 mg daily  DVT prophylaxis: Lovenox  Code Status: Full Family Communication: None at bedside Disposition Plan: 1-2 days  Consults called: None  Admission status: inpt, tele   Onnie BoerEjiroghene E Elray Dains MD [facility] Pager [phone] From 6PM-2AM.  Otherwise please contact night-coverage [email or web address] Password [email or web address]  02/08/2018, 7:26 PM

## 2018-02-08 NOTE — Patient Instructions (Signed)
[facility] concern for [facility] exacerbation;

## 2018-02-08 NOTE — ED Provider Notes (Signed)
[facility]-EMERGENCY DEPT Provider Note   CSN: [id number] Arrival date & time: 02/08/18  1346     History   Chief Complaint Chief Complaint  Patient presents with  . Shortness of Breath    HPI Mary [name] is a 42 y.o. female with a history of COPD, pulmonary hypertension, chronic right-sided CHF, DVT of upper extremity, PE, on home oxygen therapy of 3 L, who presents today for evaluation of shortness of breath.  She went to her primary care provider today who sent her here for a suspected COPD exacerbation.  She reports that for the past 2 days she has been having increasing shortness of breath, increasing cough and increased sputum production.  She reports that she is okay when sitting still/at rest, however becomes extremely dyspneic with any movements.  She reports that last night she got up to walk around while wearing her oxygen and started to feel poorly, when she checked her oxygen sat she was in the 60s and had to use her BiPAP to get her numbers up.  She denies any recent sick contacts.  Compliance with her treatment regimen.    HPI  Past Medical History:  Diagnosis Date  . Axillary adenopathy    right axillary adenopathy noted on CT chest (03/06/2012)  . Chronic right-sided CHF (congestive heart failure) (HCC) 03/23/2012   with cor pulmonale. Last RHC 03/2012  . COPD (chronic obstructive pulmonary disease) (HCC)    PFTS 03/08/12: fev1 0.58L/1%, FVC 1.18/33%, Ratop 49 and c/w ssevere obstruction. 21% BD response on FVC, RV 219%, DLCO 11/54%  . DVT of upper extremity (deep vein thrombosis) [facility](HCC) April 2013   right subclavian // Unclear precipitating cause - possibly significant right heart failure, with vascular stasis potentially predisposing to clotting.    . Exertional shortness of breath   . Hepatic cirrhosis (HCC)    Questionable history of - Noted on CT abdomen (03/2012) - thought to be due to vascular congestion from right heart failure +/-  patient's history of alcohol abuse  . History of alcoholism (HCC)   . History of chronic bronchitis   . Irregular menses   . Migraine    "~ 1/yr" (05/03/2013)  . Moderate to severe pulmonary hypertension [facility](HCC) April 2013   Cardiac cath on 03/06/12 - 1. Elevated pulmonary artery pressures, right sided filling pressures.,  2. PA: 64/45 (mean 53)    . On home oxygen therapy    "2-3 L 24/7" (05/03/2013)  . OSA (obstructive sleep apnea)    wears noctural BiPAP (05/03/2013)  . Periodontal disease   . Pneumonia ~ 1985; 01/2011  . Pulmonary embolus [facility](HCC) April 2013   Precipitating cause unclear. Was treated with coumadin from April-June 2013.  . Pulmonary hypertension [facility](HCC)     Patient Active Problem List   Diagnosis Date Noted  . COPD exacerbation (HCC) 02/08/2018  . COPD with acute exacerbation (HCC) 01/11/2018  . Left flank pain 11/01/2017  . Positive D dimer   . Leukocytosis 04/29/2017  . Acute exacerbation of chronic obstructive pulmonary disease (COPD) (HCC) 04/24/2017  . 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 Left ankle sprain 04/21/2017  . Fungemia 02/13/2017  . COPD (chronic obstructive pulmonary disease) (HCC) 02/13/2017  . Chronic respiratory failure with hypoxia and hypercapnia (HCC) 02/13/2017  . Alcohol abuse 01/31/2017  . Acute on chronic respiratory failure with hypoxia and hypercapnia (HCC)   . Tracheomalacia   . Palliative care by specialist   . Anxiety state   .  Advance directive declined by patient   . Goals of care, counseling/discussion   . Chronic hoarseness 07/27/2016  . Diabetic neuropathy (HCC) 04/30/2016  . Diabetes mellitus type 2 in obese (HCC) 04/10/2016  . Chronic diastolic heart failure (HCC) 01/25/2016  . PAH (pulmonary artery hypertension) (HCC)   . Constipation 11/18/2015  . Morbid obesity (HCC) 11/18/2015  . Lung nodule 01/30/2015  . Pulmonary mass 01/13/2015  . Gastroparesis   . Esophageal reflux   . Obstipation   . Hypokalemia  02/28/2013  . Financial difficulties 02/28/2013  . Obesity hypoventilation syndrome (HCC) 02/28/2013  . Myalgia 02/28/2013  . Anovulation 06/30/2012  . Preventative health care 05/05/2012  . Irregular menstrual cycle 04/27/2012  . Chronic right-sided CHF (congestive heart failure) (HCC) 03/23/2012  . Pulmonary hypertension (HCC) 03/23/2012  . Cor pulmonale (HCC) 03/21/2012  . OSA on BiPAP 03/19/2012  . Axillary lymphadenopathy 03/06/2012  . Congenital heart disease 01/28/2012  . Dental caries 01/28/2012    Past Surgical History:  Procedure Laterality Date  . APPENDECTOMY  05/03/2013  . CARDIAC CATHETERIZATION  03/2012  . CARDIAC CATHETERIZATION N/A 01/08/2016   Procedure: Right Heart Cath;  Surgeon: Dolores Patty, MD;  Location: [facility] INVASIVE CV LAB;  Service: Cardiovascular;  Laterality: N/A;  . CARDIAC SURGERY  July 19, 1977   "my heart was backwards" (05/03/2013)  . LAPAROSCOPIC APPENDECTOMY N/A 05/03/2013   Procedure: APPENDECTOMY LAPAROSCOPIC;  Surgeon: Cherylynn Ridges, MD;  Location[facility];  Service: General;  Laterality: N/A;  . LUNG SURGERY  27-Dec-1976  . RIGHT HEART CATHETERIZATION N/A 03/07/2012   Procedure: RIGHT HEART CATH;  Surgeon: Kathleene Hazel, MD;  Location: [facility] CATH LAB;  Service: Cardiovascular;  Laterality: N/A;    OB History    No data available       Home Medications    Prior to Admission medications   Medication Sig Start Date End Date Taking? Authorizing Provider  albuterol (PROVENTIL HFA;VENTOLIN HFA) 108 (90 BASE) MCG/ACT inhaler Inhale 2 puffs into the lungs every 6 (six) hours as needed for wheezing or shortness of breath. 09/06/14  Yes Kalman Shan, MD  albuterol (VENTOLIN HFA) 108 (90 Base) MCG/ACT inhaler USE 2 PUFFS EVERY 6 HOURS AS NEEDED FOR WHEEZING 12/13/17  Yes Kalman Shan, MD  budesonide-formoterol (SYMBICORT) 160-4.5 MCG/ACT inhaler Inhale 2 puffs into the lungs 2 (two) times daily. Patient taking differently: Inhale 2 puffs  into the lungs daily.  03/28/17  Yes Simonne Martinet, NP  diclofenac sodium (VOLTAREN) 1 % GEL Apply 2 g topically 3 (three) times daily. 09/28/17  Yes Santos-Sanchez, Chelsea Primus, MD  gabapentin (NEURONTIN) 300 MG capsule Take 2 capsules (600 mg total) by mouth 3 (three) times daily. 01/09/18  Yes Evaristo Bury, NP  Insulin Glargine (BASAGLAR KWIKPEN) 100 UNIT/ML SOPN Inject 0.15 mLs (15 Units total) into the skin daily as needed (BS). 01/14/18  Yes Gherghe, Daylene Katayama, MD  insulin lispro (HUMALOG) 100 UNIT/ML injection Inject 0-0.15 mLs (0-15 Units total) into the skin as needed. If BS >200 use 8 units  CBG < 70: Eat or drink something right away and recheck. CBG 70 - 120: 0 units CBG 121 - 150: 2 units CBG 151 - 200: 3 units CBG 201 - 250: 5 units CBG 251 - 300: 8 units CBG 301 - 350: 11 units CBG 351 - 400: 15 units CBG > 400: call MD. 01/14/18  Yes Leatha Gilding, MD  pantoprazole (PROTONIX) 40 MG tablet Take 1 tablet (40 mg total) by mouth  2 (two) times daily before a meal. 07/22/16  Yes Jeralyn Bennett, MD  potassium chloride SA (K-DUR,KLOR-CON) 20 MEQ tablet Take 1 tablet (20 mEq total) by mouth daily. 01/29/16  Yes Bensimhon, Bevelyn Buckles, MD  pregabalin (LYRICA) 50 MG capsule Take 1 capsule (50 mg total) by mouth 2 (two) times daily. 10/31/17  Yes Plotnikov, Georgina Quint, MD  roflumilast (DALIRESP) 500 MCG TABS tablet Take 500 mcg by mouth daily.   Yes [provider]  SPIRIVA RESPIMAT 1.25 MCG/ACT AERS INHALE 2 PUFFS INTO THE LUNGS DAILY. 09/27/17  Yes Kalman Shan, MD  torsemide (DEMADEX) 20 MG tablet Take 1 tablet (20 mg total) by mouth daily. 12/08/17  Yes Shambley, Audie Box, NP  traMADol (ULTRAM) 50 MG tablet TAKE 1 TABLET BY MOUTH EVERY 8 HOURS AS NEEDED FOR SEVERE PAIN 10/31/17  Yes Plotnikov, Georgina Quint, MD  doxycycline (VIBRA-TABS) 100 MG tablet Take 1 tablet (100 mg total) by mouth every 12 (twelve) hours. Patient not taking: Reported on 02/08/2018 01/14/18   Leatha Gilding, MD  guaiFENesin (MUCINEX) 600 MG 12 hr tablet Take 1 tablet (600 mg total) by mouth 2 (two) times daily. Patient not taking: Reported on 02/08/2018 02/11/17   Vassie Loll, MD  predniSONE (DELTASONE) 10 MG tablet Take 4 tablets (40 mg total) by mouth daily. 4 tablets daily x 3 days then 3 daily x 3 days then 2 daily x 3 days then 1 daily x 3 days Patient not taking: Reported on 02/08/2018 01/14/18   Leatha Gilding, MD    Family History Family History  Problem Relation Age of Onset  . Hypertension Father   . Heart disease Father        CHF; died age 20   . Alcohol abuse Father   . Other Brother        died age 62 y.o overdose    Social History Social History   Tobacco Use  . Smoking status: Former Smoker    Packs/day: 0.25    Years: 25.00    Pack years: 6.25    Types: Cigarettes    Last attempt to quit: 01/08/2018    Years since quitting: 0.0  . Smokeless tobacco: Never Used  . Tobacco comment: a couple drags of one cigarettes  Substance Use Topics  . 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 night 01/10/2018  . Drug use: No     Allergies   Patient has no known allergies.   Review of Systems Review of Systems  Constitutional: Negative for chills and fever.  HENT: Positive for postnasal drip, rhinorrhea and sore throat.   Eyes: Negative for visual disturbance.  Respiratory: Positive for cough, chest tightness and shortness of breath.   Cardiovascular: Positive for chest pain. Negative for palpitations and leg swelling.  Gastrointestinal: Positive for constipation. Negative for abdominal pain, diarrhea, nausea and vomiting.  Genitourinary: Negative for dysuria and urgency.  Musculoskeletal: Negative for arthralgias and myalgias.  Neurological: Positive for weakness. Negative for syncope, speech difficulty and headaches.  All other systems reviewed and are negative.    Physical  Exam Updated Vital Signs BP 104/69 (BP Location: Left Arm)   Pulse (!) 110   Temp 98.4 F (36.9 C) (Oral)   Resp (!) 29   Ht 5\' 2"  (1.575 m)   Wt 91.6 kg (202 lb)   LMP 01/17/2018   SpO2 92%  BMI 36.95 kg/m   Physical Exam  Constitutional: She is oriented to person, place, and time. She appears well-developed and well-nourished. No distress.  Appears older than stated age  HENT:  Head: Normocephalic and atraumatic.  Mouth/Throat: Oropharynx is clear and moist.  Eyes: Conjunctivae are normal. Pupils are equal, round, and reactive to light. Right eye exhibits no discharge. Left eye exhibits no discharge. No scleral icterus.  Neck: Normal range of motion.  Cardiovascular: Normal rate, regular rhythm, normal heart sounds and intact distal pulses.  Pulmonary/Chest: Effort normal. No stridor. No respiratory distress. She has decreased breath sounds in the right upper field, the right middle field, the right lower field, the left upper field, the left middle field and the left lower field. She has wheezes in the right upper field, the right middle field, the right lower field, the left upper field, the left middle field and the left lower field. She exhibits tenderness. She exhibits no bony tenderness and no crepitus.  Abdominal: Soft. She exhibits no distension. There is no rebound and no guarding.  Musculoskeletal: Normal range of motion. She exhibits no edema or deformity.       Right lower leg: Normal. She exhibits no tenderness and no edema.       Left lower leg: Normal. She exhibits no tenderness and no edema.  Neurological: She is alert and oriented to person, place, and time. She exhibits normal muscle tone.  Skin: Skin is warm and dry. Capillary refill takes less than 2 seconds. She is not diaphoretic.  Psychiatric: She has a normal mood and affect. Her behavior is normal.  Nursing note and vitals reviewed.    ED Treatments / Results  Labs (all labs ordered are listed, but  only abnormal results are displayed) Labs Reviewed  COMPREHENSIVE METABOLIC PANEL - Abnormal; Notable for the following components:      Result Value   Chloride 95 (*)    CO2 34 (*)    Glucose, Bld 137 (*)    Total Protein 8.2 (*)    AST 66 (*)    Alkaline Phosphatase 139 (*)    Total Bilirubin 1.7 (*)    All other components within normal limits  CBC WITH DIFFERENTIAL/PLATELET - Abnormal; Notable for the following components:   RBC 3.75 (*)    MCV 104.3 (*)    MCH 34.1 (*)    Platelets 136 (*)    All other components within normal limits  BRAIN NATRIURETIC PEPTIDE  INFLUENZA PANEL BY PCR (TYPE A & B)  I-STAT BETA HCG BLOOD, ED ([facility], [facility], [facility] ONLY)  I-STAT BETA HCG BLOOD, ED ([facility], [facility], [facility] ONLY)  I-STAT TROPONIN, ED    EKG  EKG Interpretation  Date/Time:  Wednesday February 08 2018 14:28:31 EST Ventricular Rate:  92 PR Interval:    QRS Duration: 94 QT Interval:  404 QTC Calculation: 500 R Axis:   97 Text Interpretation:  Sinus rhythm Biatrial enlargement Borderline right axis deviation Borderline repolarization abnormality Borderline prolonged QT interval Baseline wander in lead(s) I III aVL aVF No significant change since last tracing Confirmed by Melene Plan [phone]) on 02/08/2018 3:27:24 PM       Radiology Dg Chest 2 View  Result Date: 02/08/2018 CLINICAL DATA:  Shortness of breath and cough.  History of COPD. EXAM: CHEST - 2 VIEW COMPARISON:  Chest x-ray dated January 11, 2018. FINDINGS: Stable mild cardiomegaly. Normal pulmonary vascularity. Bibasilar atelectasis. No focal consolidation, pleural effusion, or pneumothorax. No acute osseous abnormality. IMPRESSION:  No active cardiopulmonary disease. Electronically Signed   By: Obie Dredge M.D.   On: 02/08/2018 14:44   Ct Angio Chest Pe W/cm &/or Wo Cm  Result Date: 02/08/2018 CLINICAL DATA:  Dyspnea x2 days. Productive cough and chest pain from the cough. No fever. EXAM: CT ANGIOGRAPHY CHEST WITH CONTRAST TECHNIQUE:  Multidetector CT imaging of the chest was performed using the standard protocol during bolus administration of intravenous contrast. Multiplanar CT image reconstructions and MIPs were obtained to evaluate the vascular anatomy. CONTRAST:  ISOVUE-370 IOPAMIDOL (ISOVUE-370) INJECTION 76% COMPARISON:  CXR 02/08/2018, chest CT 04/30/2017 FINDINGS: Adequate opacification of the central pulmonary arteries. Limited assessment at the segmental and subsegmental levels due to motion artifacts and timing of contrast. Cardiovascular: No large central pulmonary embolus noted. Normal branch pattern of the great vessels. Limited assessment of the ascending aorta due to motion artifacts. No definite dissection or aneurysm. Mild dilatation of the main pulmonary artery to 3.5 cm compatible with chronic pulmonary hypertension. Minimal coronary arteriosclerosis. Borderline cardiomegaly. No pericardial effusion. Mediastinum/Nodes: No enlarged mediastinal, hilar, or axillary lymph nodes. Thyroid gland, trachea, and esophagus demonstrate no significant findings. Lungs/Pleura: Respiratory motion artifacts limit assessment at the lung bases. Posterior segment right upper lobe airspace opacity is noted which may reflect a small focus of pneumonia, alveolitis or pneumonitis among some possibilities though not exclusive. Linear opacity noted in the right lower lobe, new since prior and may reflect postinfectious or postinflammatory change as well versus atelectasis or scarring. Subpleural areas of atelectasis and/or scarring are noted in both upper lobes, lingula and left lower lobe. Upper Abdomen: Hepatic steatosis is noted. Musculoskeletal: No acute or significant osseous findings. Review of the MIP images confirms the above findings. IMPRESSION: 1. No large central pulmonary embolus. Evaluation of the lower lobe pulmonary parenchyma as well as segmental and subsegmental pulmonary arteries are limited due to motion artifacts. 2. Minimal  coronary arteriosclerosis and aortic atherosclerosis. 3. Mild dilatation of the main pulmonary artery likely reflecting chronic pulmonary hypertension. 4. Hepatic steatosis. Aortic Atherosclerosis (ICD10-I70.0). Electronically Signed   By: Tollie Eth M.D.   On: 02/08/2018 18:01    Procedures Procedures (including critical care time)  Medications Ordered in ED Medications  albuterol (PROVENTIL,VENTOLIN) solution continuous neb (0 mg/hr Nebulization Stopped 02/08/18 1836)  cefTRIAXone (ROCEPHIN) 1 g in sodium chloride 0.9 % 100 mL IVPB (1 g Intravenous New Bag/Given 02/08/18 1901)  doxycycline (VIBRA-TABS) tablet 100 mg (not administered)  ipratropium-albuterol (DUONEB) 0.5-2.5 (3) MG/3ML nebulizer solution 3 mL (3 mLs Nebulization Given 02/08/18 1615)  methylPREDNISolone sodium succinate (SOLU-MEDROL) 125 mg/2 mL injection 125 mg (125 mg Intravenous Given 02/08/18 1614)  iopamidol (ISOVUE-370) 76 % injection (100 mLs  Contrast Given 02/08/18 1731)     Initial Impression / Assessment and Plan / ED Course  I have reviewed the triage vital signs and the nursing notes.  Pertinent labs & imaging results that were available during my care of the patient were reviewed by me and considered in my medical decision making (see chart for details).  Clinical Course as of Feb 09 1927  Wed Feb 08, 2018  1727 Patient re-evaluated, continues to be short of breath, no significiant change from before, she wants to try one more hour long neb and if that doesn't work then will try bipap.   [EH]  1858 Spoke with hosptialist who will come see patient.   [EH]    Clinical Course User Index [EH] Cristina Gong, PA-C    Patient presents today  for evaluation of shortness of breath.  She has a history of COPD and reports that she is having increased sputum production, increased cough.  She was given a hour-long treatment with minimal relief in her symptoms.  Chest x-ray did not show any acute abnormalities.  Based on  tachycardia and hypoxia CT PE study was performed which did not show any PEs, however shows a possible right upper lobe pneumonia.  She is treated with doxycycline and Rocephin due to her prolonged QT.  I consulted hospitalist for admission who agreed to admit patient.   Final Clinical Impressions(s) / ED Diagnoses   Final diagnoses:  COPD exacerbation [facility])    ED Discharge Orders    None       Cristina Gong, PA-C 02/10/18 1120    Melene Plan, [place] 02/11/18 2338

## 2018-02-08 NOTE — ED Triage Notes (Signed)
Patient was referred from Jeanine LuzGregory Calone, FNP for COPD exacerbation. Patient reports she started feeling short of breath about 2 days ago. Associated symptoms being productive cough and pain from the cough. Denies any fever. Patient wears oxygen [place] at 3L.

## 2018-02-08 NOTE — ED Notes (Signed)
ED PA at bedside

## 2018-02-08 NOTE — Progress Notes (Signed)
Mary [name] is a 42 y.o. female with the following history as recorded in EpicCare:  Patient Active Problem List   Diagnosis Date Noted  . Prolonged QT interval 02/08/2018  . COPD with acute exacerbation (HCC) 01/11/2018  . Left flank pain 11/01/2017  . Positive D dimer   . Leukocytosis 04/29/2017  . Acute exacerbation of chronic obstructive pulmonary disease (COPD) (HCC) 04/24/2017  . 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 Left ankle sprain 04/21/2017  . Fungemia 02/13/2017  . COPD (chronic obstructive pulmonary disease) (HCC) 02/13/2017  . Chronic respiratory failure with hypoxia and hypercapnia (HCC) 02/13/2017  . Alcohol abuse 01/31/2017  . Acute on chronic respiratory failure with hypoxia and hypercapnia (HCC)   . Tracheomalacia   . Palliative care by specialist   . Anxiety state   . Advance directive declined by patient   . Goals of care, counseling/discussion   . Chronic hoarseness 07/27/2016  . Diabetic neuropathy (HCC) 04/30/2016  . Diabetes mellitus type 2 in obese (HCC) 04/10/2016  . Chronic diastolic heart failure (HCC) 01/25/2016  . PAH (pulmonary artery hypertension) (HCC)   . Constipation 11/18/2015  . Morbid obesity (HCC) 11/18/2015  . Lung nodule 01/30/2015  . Pulmonary mass 01/13/2015  . Gastroparesis   . Esophageal reflux   . Obstipation   . Hypokalemia 02/28/2013  . Financial difficulties 02/28/2013  . Obesity hypoventilation syndrome (HCC) 02/28/2013  . Myalgia 02/28/2013  . Anovulation 06/30/2012  . Preventative health care 05/05/2012  . Irregular menstrual cycle 04/27/2012  . Chronic right-sided CHF (congestive heart failure) (HCC) 03/23/2012  . Pulmonary hypertension (HCC) 03/23/2012  . Cor pulmonale (HCC) 03/21/2012  . OSA on BiPAP 03/19/2012  . Axillary lymphadenopathy 03/06/2012  . Congenital heart disease 01/28/2012  . Dental caries 01/28/2012    No current facility-administered medications for this visit.    No current  outpatient medications on file.   Facility-Administered Medications Ordered in Other Visits  Medication Dose Route Frequency Provider Last Rate Last Dose  . albuterol (PROVENTIL) (2.5 MG/3ML) 0.083% nebulizer solution 2.5 mg  2.5 mg Nebulization Q4H PRN Emokpae, Ejiroghene E, MD      . budesonide (PULMICORT) nebulizer solution 0.25 mg  0.25 mg Nebulization BID Emokpae, Ejiroghene E, MD   0.25 mg at 02/09/18 0932  . cefTRIAXone (ROCEPHIN) 1 g in sodium chloride 0.9 % 100 mL IVPB  1 g Intravenous Q24H Emokpae, Ejiroghene E, MD      . chlorhexidine (PERIDEX) 0.12 % solution 15 mL  15 mL Mouth Rinse BID Emokpae, Ejiroghene E, MD   15 mL at 02/09/18 0813  . doxycycline (VIBRAMYCIN) 100 mg in sodium chloride 0.9 % 250 mL IVPB  100 mg Intravenous Q12H Emokpae, Ejiroghene E, MD 125 mL/hr at 02/09/18 0814 100 mg at 02/09/18 0814  . enoxaparin (LOVENOX) injection 40 mg  40 mg Subcutaneous QHS Emokpae, Ejiroghene E, MD   40 mg at 02/08/18 2320  . gabapentin (NEURONTIN) capsule 600 mg  600 mg Oral TID Albertine Grates, MD      . guaiFENesin-codeine 100-10 MG/5ML solution 5 mL  5 mL Oral Q4H PRN Emokpae, Ejiroghene E, MD      . insulin aspart (novoLOG) injection 0-9 Units  0-9 Units Subcutaneous TID WC Emokpae, Ejiroghene E, MD   9 Units at 02/09/18 0816  . insulin glargine (LANTUS) injection 10 Units  10 Units Subcutaneous Q2200 Emokpae, Ejiroghene E, MD   10 Units at 02/08/18 2320  . ipratropium-albuterol (DUONEB) 0.5-2.5 (3) MG/3ML nebulizer solution 3 mL  3 mL Nebulization Q6H Emokpae, Ejiroghene E, MD   3 mL at 02/09/18 0932  . MEDLINE mouth rinse  15 mL Mouth Rinse q12n4p Emokpae, Ejiroghene E, MD      . ondansetron (ZOFRAN) tablet 4 mg  4 mg Oral Q6H PRN Emokpae, Ejiroghene E, MD       Or  . ondansetron (ZOFRAN) injection 4 mg  4 mg Intravenous Q6H PRN Emokpae, Ejiroghene E, MD      . pantoprazole (PROTONIX) EC tablet 40 mg  40 mg Oral BID AC Emokpae, Ejiroghene E, MD   40 mg at 02/09/18 0814  . potassium  chloride SA (K-DUR,KLOR-CON) CR tablet 20 mEq  20 mEq Oral Daily Emokpae, Ejiroghene E, MD      . potassium chloride SA (K-DUR,KLOR-CON) CR tablet 40 mEq  40 mEq Oral Once Emokpae, Ejiroghene E, MD      . predniSONE (DELTASONE) tablet 50 mg  50 mg Oral Q breakfast Emokpae, Ejiroghene E, MD   50 mg at 02/09/18 0814  . pregabalin (LYRICA) capsule 50 mg  50 mg Oral BID Albertine GratesXu, Fang, MD      . senna-docusate (Senokot-S) tablet 1 tablet  1 tablet Oral QHS PRN Emokpae, Ejiroghene E, MD      . torsemide (DEMADEX) tablet 20 mg  20 mg Oral Daily Emokpae, Ejiroghene E, MD      . traMADol (ULTRAM) tablet 50 mg  50 mg Oral Q8H PRN Schorr, Roma KayserKatherine P, NP   50 mg at 02/08/18 2321    Allergies: Bee venom  Past Medical History:  Diagnosis Date  . Axillary adenopathy    right axillary adenopathy noted on CT chest (03/06/2012)  . Chronic right-sided CHF (congestive heart failure) (HCC) 03/23/2012   with cor pulmonale. Last RHC 03/2012  . COPD (chronic obstructive pulmonary disease) (HCC)    PFTS 03/08/12: fev1 0.58L/1%, FVC 1.18/33%, Ratop 49 and c/w ssevere obstruction. 21% BD response on FVC, RV 219%, DLCO 11/54%  . DVT of upper extremity (deep vein thrombosis) [facility](HCC) April 2013   right subclavian // Unclear precipitating cause - possibly significant right heart failure, with vascular stasis potentially predisposing to clotting.    . Exertional shortness of breath   . Hepatic cirrhosis (HCC)    Questionable history of - Noted on CT abdomen (03/2012) - thought to be due to vascular congestion from right heart failure +/- patient's history of alcohol abuse  . History of alcoholism (HCC)   . History of chronic bronchitis   . Irregular menses   . Migraine    "~ 1/yr" (05/03/2013)  . Moderate to severe pulmonary hypertension [facility](HCC) April 2013   Cardiac cath on 03/06/12 - 1. Elevated pulmonary artery pressures, right sided filling pressures.,  2. PA: 64/45 (mean 53)    . On home oxygen therapy    "2-3 L 24/7"  (05/03/2013)  . OSA (obstructive sleep apnea)    wears noctural BiPAP (05/03/2013)  . Periodontal disease   . Pneumonia ~ 1985; 01/2011  . Pulmonary embolus [facility](HCC) April 2013   Precipitating cause unclear. Was treated with coumadin from April-June 2013.  . Pulmonary hypertension (HCC)     Past Surgical History:  Procedure Laterality Date  . APPENDECTOMY  05/03/2013  . CARDIAC CATHETERIZATION  03/2012  . CARDIAC CATHETERIZATION N/A 01/08/2016   Procedure: Right Heart Cath;  Surgeon: Dolores Pattyaniel R Bensimhon, MD;  [facility];  Service: Cardiovascular;  Laterality: N/A;  . CARDIAC SURGERY  10/16/1977   "my heart  was backwards" (05/03/2013)  . LAPAROSCOPIC APPENDECTOMY N/A 05/03/2013   Procedure: APPENDECTOMY LAPAROSCOPIC;  Surgeon: Cherylynn Ridges, MD;  Location[facility] OR;  Service: General;  Laterality: N/A;  . LUNG SURGERY  11-01-77  . RIGHT HEART CATHETERIZATION N/A 03/07/2012   Procedure: RIGHT HEART CATH;  Surgeon: Kathleene Hazel, MD;  Location: [facility] CATH LAB;  Service: Cardiovascular;  Laterality: N/A;    Family History  Problem Relation Age of Onset  . Hypertension Father   . Heart disease Father        CHF; died age 41   . Alcohol abuse Father   . Other Brother        died age 89 y.o overdose    Social History   Tobacco Use  . Smoking status: Former Smoker    Packs/day: 0.25    Years: 25.00    Pack years: 6.25    Types: Cigarettes    Last attempt to quit: 01/08/2018    Years since quitting: 0.0  . Smokeless tobacco: Never Used  . Tobacco comment: a couple drags of one cigarettes  Substance Use Topics  . 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 night 01/10/2018    Subjective:  Patient presents with concerns for cough/ congestion/ possible COPD exacerbation; notes that her O2 sats have been dropping with exertion over the past few days; states that her O2 sats dropped to 62% last night;  patient complaining of worsening cough as well; denies any fever; + chest pain especially with coughing;   Objective:  Vitals:   02/08/18 1308  BP: 108/60  Pulse: 94  Temp: 98.8 F (37.1 C)  TempSrc: Oral  SpO2: 99%  Weight: 202 lb 1.3 oz (91.7 kg)  Height: 5\' 3"  (1.6 m)    General: Well developed, well nourished, in no acute distress; on oxygen;  Skin : Warm and dry.  Head: Normocephalic and atraumatic  Eyes: Sclera and conjunctiva clear; pupils round and reactive to light; extraocular movements intact  Ears: External normal; canals clear; tympanic membranes normal  Oropharynx: Pink, supple. No suspicious lesions  Neck: Supple without thyromegaly, adenopathy  Lungs: Respirations mildly labored; diminished breath sounds; severity of cough is noted in office; CVS exam: normal rate and regular rhythm.  Neurologic: Alert and oriented; speech intact; face symmetrical; moves all extremities well; CNII-XII intact without focal deficit   Assessment:  1. COPD with acute exacerbation (HCC)     Plan:  Patient is sent to [facility] for further evaluation and probable admittance; she is accompanied by her husband and they opt to go by private vehicle; ER is notified that patient is being sent; follow-up with her PCP as scheduled otherwise.    No Follow-up on file.  No orders of the defined types were placed in this encounter.   Requested Prescriptions    No prescriptions requested or ordered in this encounter

## 2018-02-08 NOTE — ED Notes (Signed)
ED TO INPATIENT HANDOFF REPORT  Name/Age/Gender Mary [name] 42 y.o. female  Code Status Code Status History    Date Active Date Inactive Code Status Order ID Comments User Context   01/11/2018 11:20 01/14/2018 12:30 Full Code [id number]  Patrecia Pour, MD Inpatient   04/29/2017 22:14 05/03/2017 13:37 Full Code [id number]  Vianne Bulls, MD ED   04/24/2017 11:49 04/27/2017 22:52 Full Code [id number]  Lady Deutscher, MD ED   03/24/2017 17:45 03/28/2017 20:34 Full Code [id number]  Modena Jansky, MD ED   02/13/2017 17:07 02/15/2017 21:24 Full Code [id number]  Modena Jansky, MD Inpatient   02/09/2017 08:20 02/11/2017 19:54 Full Code [id number]  Modena Jansky, MD Inpatient   01/31/2017 20:36 02/04/2017 18:42 Full Code [id number]  Toy Baker, MD Inpatient   01/11/2017 10:57 01/15/2017 18:03 Full Code [id number]  Waldemar Dickens, MD ED   08/25/2016 13:04 08/28/2016 14:31 Full Code [id number]  Waldemar Dickens, MD ED   08/09/2016 18:48 08/13/2016 21:27 Full Code [id number]  Waldemar Dickens, MD Inpatient   07/18/2016 07:57 07/22/2016 20:00 Full Code [id number]  Rondel Jumbo, PA-C ED   04/01/2016 23:08 04/11/2016 19:32 Full Code [id number]  Norval Morton, MD ED   12/16/2015 18:23 12/19/2015 21:09 Full Code [id number]  Elmarie Shiley, MD Inpatient   11/14/2015 02:32 11/17/2015 14:26 Full Code [id number]  Allyne Gee, MD Inpatient   01/13/2015 04:07 01/15/2015 20:52 Full Code [id number]  Etta Quill, DO ED   12/14/2014 04:13 12/18/2014 16:08 Full Code [id number]  Rise Patience, MD Inpatient   05/03/2013 09:35 05/04/2013 15:24 Full Code [id number]  Gwenyth Ober, MD Inpatient   02/28/2013 10:38 03/02/2013 19:19 Full Code [id number]  Annamarie Dawley, DO Inpatient   02/28/2013 08:05 02/28/2013 10:38 Full Code [id number]  Annamarie Dawley, DO ED   03/06/2012 13:10 03/11/2012 18:24 Full Code [id number]  Irish Elders, RN Inpatient      Home/SNF/Other Home  Chief Complaint SOB   Level  of Care/Admitting Diagnosis ED Disposition    ED Disposition Condition [facility]: [place] [[id number]]  Level of Care: Telemetry [5]  Admit to tele based on following criteria: Other see comments  Comments: Tachycardia  Diagnosis: COPD exacerbation [facility]) [[id number]]  Admitting Physician: Bethena Roys [phone]  Attending Physician: Bethena Roys [phone]  Estimated length of stay: past midnight tomorrow  Certification:: I certify this patient will need inpatient services for at least 2 midnights  PT Class (Do Not Modify): Inpatient [101]  PT Acc Code (Do Not Modify): Private [1]       Medical History Past Medical History:  Diagnosis Date  . Axillary adenopathy    right axillary adenopathy noted on CT chest (03/06/2012)  . Chronic right-sided CHF (congestive heart failure) ([place]) 03/23/2012   with cor pulmonale. Last RHC 03/2012  . COPD (chronic obstructive pulmonary disease) ([place])    PFTS 03/08/12: fev1 0.58L/1%, FVC 1.18/33%, Ratop 49 and c/w ssevere obstruction. 21% BD response on FVC, RV 219%, DLCO 11/54%  . DVT of upper extremity (deep vein thrombosis) [facility]) April 2013   right subclavian // Unclear precipitating cause - possibly significant right heart failure, with vascular stasis potentially predisposing to clotting.    . Exertional shortness of breath   . Hepatic cirrhosis ([place])    Questionable history of - Noted on CT abdomen (03/2012) - thought to be due to vascular congestion from right heart failure +/-  patient's history of alcohol abuse  . History of alcoholism ([place])   . History of chronic bronchitis   . Irregular menses   . Migraine    "~ 1/yr" (05/03/2013)  . Moderate to severe pulmonary hypertension [facility]) April 2013   Cardiac cath on 03/06/12 - 1. Elevated pulmonary artery pressures, right sided filling pressures.,  2. PA: 64/45 (mean 53)    . On home oxygen therapy    "2-3 L 24/7" (05/03/2013)  . OSA (obstructive sleep  apnea)    wears noctural BiPAP (05/03/2013)  . Periodontal disease   . Pneumonia ~ 1985; 01/2011  . Pulmonary embolus [facility]) April 2013   Precipitating cause unclear. Was treated with coumadin from April-June 2013.  . Pulmonary hypertension ([place])     Allergies No Known Allergies  IV Location/Drains/Wounds Patient Lines/Drains/Airways Status   Active Line/Drains/Airways    Name:   Placement date:   Placement time:   Site:   Days:   Peripheral IV 02/08/18 Right Forearm   02/08/18    1613    Forearm   less than 1          Labs/Imaging Results for orders placed or performed during the hospital encounter of 02/08/18 (from the past 48 hour(s))  Influenza panel by PCR (type A & B)     Status: None   Collection Time: 02/08/18  4:13 PM  Result Value Ref Range   Influenza A By PCR NEGATIVE NEGATIVE   Influenza B By PCR NEGATIVE NEGATIVE    Comment: (NOTE) The Xpert Xpress Flu assay is intended as an aid in the diagnosis of  influenza and should not be used as a sole basis for treatment.  This  assay is FDA approved for nasopharyngeal swab specimens only. Nasal  washings and aspirates are unacceptable for Xpert Xpress Flu testing. Performed at [facility], [place] [address], [place], [place] [address]   Comprehensive metabolic panel     Status: Abnormal   Collection Time: 02/08/18  4:16 PM  Result Value Ref Range   Sodium 139 135 - 145 mmol/L   Potassium 3.6 3.5 - 5.1 mmol/L   Chloride 95 (L) 101 - 111 mmol/L   CO2 34 (H) 22 - 32 mmol/L   Glucose, Bld 137 (H) 65 - 99 mg/dL   BUN 7 6 - 20 mg/dL   Creatinine, Ser 0.47 0.44 - 1.00 mg/dL   Calcium 9.5 8.9 - 10.3 mg/dL   Total Protein 8.2 (H) 6.5 - 8.1 g/dL   Albumin 3.9 3.5 - 5.0 g/dL   AST 66 (H) 15 - 41 U/L   ALT 52 14 - 54 U/L   Alkaline Phosphatase 139 (H) 38 - 126 U/L   Total Bilirubin 1.7 (H) 0.3 - 1.2 mg/dL   GFR calc non Af Amer >60 >60 mL/min   GFR calc Af Amer >60 >60 mL/min    Comment: (NOTE) The  eGFR has been calculated using the CKD EPI equation. This calculation has not been validated in all clinical situations. eGFR's persistently <60 mL/min signify possible Chronic Kidney Disease.    Anion gap 10 5 - 15    Comment: Performed at [facility], [place] [address] [place], [place] [address]  CBC with Differential     Status: Abnormal   Collection Time: 02/08/18  4:16 PM  Result Value Ref Range   WBC 9.6 4.0 - 10.5 K/uL   RBC 3.75 (L) 3.87 - 5.11 MIL/uL   Hemoglobin 12.8  12.0 - 15.0 g/dL   HCT 39.1 36.0 - 46.0 %   MCV 104.3 (H) 78.0 - 100.0 fL   MCH 34.1 (H) 26.0 - 34.0 pg   MCHC 32.7 30.0 - 36.0 g/dL   RDW 14.2 11.5 - 15.5 %   Platelets 136 (L) 150 - 400 K/uL   Neutrophils Relative % 75 %   Neutro Abs 7.2 1.7 - 7.7 K/uL   Lymphocytes Relative 15 %   Lymphs Abs 1.5 0.7 - 4.0 K/uL   Monocytes Relative 8 %   Monocytes Absolute 0.8 0.1 - 1.0 K/uL   Eosinophils Relative 2 %   Eosinophils Absolute 0.2 0.0 - 0.7 K/uL   Basophils Relative 0 %   Basophils Absolute 0.0 0.0 - 0.1 K/uL    Comment: Performed at [facility], [place] [address] [place], [place] [address]  Brain natriuretic peptide     Status: None   Collection Time: 02/08/18  4:16 PM  Result Value Ref Range   B Natriuretic Peptide 40.9 0.0 - 100.0 pg/mL    Comment: Performed at [facility], [place] [address] [place], [place] [address]  Troponin I     Status: None   Collection Time: 02/08/18  4:16 PM  Result Value Ref Range   Troponin I <0.03 <0.03 ng/mL    Comment: Performed at [facility], [place] [address], [place], [place] [address]  Magnesium     Status: None   Collection Time: 02/08/18  4:16 PM  Result Value Ref Range   Magnesium 1.7 1.7 - 2.4 mg/dL    Comment: Performed at [facility], [place] [address] [place], [place] [address]  I-Stat beta hCG blood, ED     Status: None   Collection Time: 02/08/18  4:29 PM  Result  Value Ref Range   I-stat hCG, quantitative <5.0 <5 mIU/mL   Comment 3            Comment:   GEST. AGE      CONC.  (mIU/mL)   <=1 WEEK        5 - 50     2 WEEKS       50 - 500     3 WEEKS       100 - 10,000     4 WEEKS     1,000 - 30,000        FEMALE AND NON-PREGNANT FEMALE:     LESS THAN 5 mIU/mL   I-stat troponin, ED     Status: None   Collection Time: 02/08/18  4:29 PM  Result Value Ref Range   Troponin i, poc 0.00 0.00 - 0.08 ng/mL   Comment 3            Comment: Due to the release kinetics of cTnI, a negative result within the first hours of the onset of symptoms does not rule out myocardial infarction with certainty. If myocardial infarction is still suspected, repeat the test at appropriate intervals.    Dg Chest 2 View  Result Date: 02/08/2018 CLINICAL DATA:  Shortness of breath and cough.  History of COPD. EXAM: CHEST - 2 VIEW COMPARISON:  Chest x-ray dated January 11, 2018. FINDINGS: Stable mild cardiomegaly. Normal pulmonary vascularity. Bibasilar atelectasis. No focal consolidation, pleural effusion, or pneumothorax. No acute osseous abnormality. IMPRESSION: No active cardiopulmonary disease. Electronically Signed   By: Titus Dubin M.D.   On: 02/08/2018 14:44   Ct Angio Chest Pe W/cm &/or  Wo Cm  Result Date: 02/08/2018 CLINICAL DATA:  Dyspnea x2 days. Productive cough and chest pain from the cough. No fever. EXAM: CT ANGIOGRAPHY CHEST WITH CONTRAST TECHNIQUE: Multidetector CT imaging of the chest was performed using the standard protocol during bolus administration of intravenous contrast. Multiplanar CT image reconstructions and MIPs were obtained to evaluate the vascular anatomy. CONTRAST:  123m ISOVUE-370 IOPAMIDOL (ISOVUE-370) INJECTION 76% COMPARISON:  CXR 02/08/2018, chest CT 04/30/2017 FINDINGS: Adequate opacification of the central pulmonary arteries. Limited assessment at the segmental and subsegmental levels due to motion artifacts and timing of contrast.  Cardiovascular: No large central pulmonary embolus noted. Normal branch pattern of the great vessels. Limited assessment of the ascending aorta due to motion artifacts. No definite dissection or aneurysm. Mild dilatation of the main pulmonary artery to 3.5 cm compatible with chronic pulmonary hypertension. Minimal coronary arteriosclerosis. Borderline cardiomegaly. No pericardial effusion. Mediastinum/Nodes: No enlarged mediastinal, hilar, or axillary lymph nodes. Thyroid gland, trachea, and esophagus demonstrate no significant findings. Lungs/Pleura: Respiratory motion artifacts limit assessment at the lung bases. Posterior segment right upper lobe airspace opacity is noted which may reflect a small focus of pneumonia, alveolitis or pneumonitis among some possibilities though not exclusive. Linear opacity noted in the right lower lobe, new since prior and may reflect postinfectious or postinflammatory change as well versus atelectasis or scarring. Subpleural areas of atelectasis and/or scarring are noted in both upper lobes, lingula and left lower lobe. Upper Abdomen: Hepatic steatosis is noted. Musculoskeletal: No acute or significant osseous findings. Review of the MIP images confirms the above findings. IMPRESSION: 1. No large central pulmonary embolus. Evaluation of the lower lobe pulmonary parenchyma as well as segmental and subsegmental pulmonary arteries are limited due to motion artifacts. 2. Minimal coronary arteriosclerosis and aortic atherosclerosis. 3. Mild dilatation of the main pulmonary artery likely reflecting chronic pulmonary hypertension. 4. Hepatic steatosis. Aortic Atherosclerosis (ICD10-I70.0). Electronically Signed   By: DAshley RoyaltyM.D.   On: 02/08/2018 18:01    Pending Labs Unresulted Labs (From admission, onward)   Start     Ordered   02/08/18 2200  Troponin I (q 6hr x 3)  Now then every 6 hours,   R     02/08/18 1935   Signed and Held  HIV antibody (Routine Testing)  Once,   R      Signed and Held      Vitals/Pain Today's Vitals   02/08/18 1715 02/08/18 2030 02/08/18 2045 02/08/18 2114  BP: 104/69   119/74  Pulse: (!) 110 (!) 103 99 98  Resp: (!) 29 (!) 28 (!) 22 (!) 24  Temp:      TempSrc:      SpO2: 92% 91% 93% 93%  Weight:      Height:      PainSc:        Isolation Precautions Droplet precaution  Medications Medications  albuterol (PROVENTIL,VENTOLIN) solution continuous neb (0 mg/hr Nebulization Stopped 02/08/18 1836)  ipratropium-albuterol (DUONEB) 0.5-2.5 (3) MG/3ML nebulizer solution 3 mL (3 mLs Nebulization Given 02/08/18 1615)  methylPREDNISolone sodium succinate (SOLU-MEDROL) 125 mg/2 mL injection 125 mg (125 mg Intravenous Given 02/08/18 1614)  iopamidol (ISOVUE-370) 76 % injection (100 mLs  Contrast Given 02/08/18 1731)  cefTRIAXone (ROCEPHIN) 1 g in sodium chloride 0.9 % 100 mL IVPB (0 g Intravenous Stopped 02/08/18 1935)  doxycycline (VIBRA-TABS) tablet 100 mg (100 mg Oral Given 02/08/18 1941)    Mobility walks with person assist

## 2018-02-09 ENCOUNTER — Other Ambulatory Visit: Payer: Self-pay | Admitting: Nurse Practitioner

## 2018-02-09 DIAGNOSIS — J9611 Chronic respiratory failure with hypoxia: Secondary | ICD-10-CM | POA: Diagnosis not present

## 2018-02-09 DIAGNOSIS — E662 Morbid (severe) obesity with alveolar hypoventilation: Secondary | ICD-10-CM | POA: Diagnosis not present

## 2018-02-09 DIAGNOSIS — E1142 Type 2 diabetes mellitus with diabetic polyneuropathy: Secondary | ICD-10-CM

## 2018-02-09 DIAGNOSIS — J441 Chronic obstructive pulmonary disease with (acute) exacerbation: Secondary | ICD-10-CM | POA: Diagnosis not present

## 2018-02-09 DIAGNOSIS — J189 Pneumonia, unspecified organism: Secondary | ICD-10-CM | POA: Diagnosis not present

## 2018-02-09 LAB — GLUCOSE, CAPILLARY
GLUCOSE-CAPILLARY: 359 mg/dL — AB (ref 65–99)
GLUCOSE-CAPILLARY: 407 mg/dL — AB (ref 65–99)
GLUCOSE-CAPILLARY: 369 mg/dL — AB (ref 65–99)
Glucose-Capillary: 332 mg/dL — ABNORMAL HIGH (ref 65–99)

## 2018-02-09 LAB — EXPECTORATED SPUTUM ASSESSMENT W GRAM STAIN, RFLX TO RESP C

## 2018-02-09 LAB — TROPONIN I

## 2018-02-09 LAB — HIV ANTIBODY (ROUTINE TESTING W REFLEX): HIV Screen 4th Generation wRfx: NONREACTIVE

## 2018-02-09 LAB — EXPECTORATED SPUTUM ASSESSMENT W REFEX TO RESP CULTURE

## 2018-02-09 MED ORDER — PREGABALIN 50 MG PO CAPS
50.0000 mg | ORAL_CAPSULE | Freq: Two times a day (BID) | ORAL | Status: DC
  Administered 2018-02-09 – 2018-02-10 (×2): 50 mg via ORAL
  Filled 2018-02-09 (×2): qty 1

## 2018-02-09 MED ORDER — ALBUTEROL SULFATE (2.5 MG/3ML) 0.083% IN NEBU
2.5000 mg | INHALATION_SOLUTION | RESPIRATORY_TRACT | Status: DC | PRN
Start: 2018-02-09 — End: 2018-02-10

## 2018-02-09 MED ORDER — GABAPENTIN 300 MG PO CAPS
600.0000 mg | ORAL_CAPSULE | Freq: Three times a day (TID) | ORAL | Status: DC
  Administered 2018-02-09 – 2018-02-10 (×5): 600 mg via ORAL
  Filled 2018-02-09 (×6): qty 2

## 2018-02-09 NOTE — Progress Notes (Signed)
PROGRESS NOTE  Jana HakimMichelle L Kautzman UJW:[id number] DOB: 02/21/1976 DOA: 02/08/2018 PCP: System, Pcp Not In  HPI/Recap of past 24 hours:  Improving, no fever, no chest pain, no edema  Assessment/Plan: Principal Problem:   Acute exacerbation of chronic obstructive pulmonary disease (COPD) (HCC) Active Problems:   OSA on BiPAP   Cor pulmonale (HCC)   Pulmonary hypertension (HCC)   Obesity hypoventilation syndrome (HCC)   Diabetes mellitus type 2 in obese (HCC)   Chronic respiratory failure with hypoxia and hypercapnia (HCC)   Prolonged QT interval   COPD exacerbation with community-acquired pneumonia, chronic hypoxia on home oxygen Improving, wheezing resolved, continue current treatment  QTC prolongation QTC 500 on presentation, likely from hypokalemia, status post potassium supplement PT EKG this morning QTC improved at 472  Insulin-dependent type 2 diabetes Is packed blood sugar increase in the setting of steroid use, adjust insulin  Right-sided CHF cor pulmonale Currently euvolemic, continue home meds torsemide 20 mg daily  Code Status: full  Family Communication: patient   Disposition Plan: Likely home tomorrow   Consultants:  none  Procedures:  none  Antibiotics:  As above   Objective: BP 119/74 (BP Location: Left Arm)   Pulse 73   Temp 97.6 F (36.4 C) (Oral)   Resp 18   Ht 5\' 2"  (1.575 m)   Wt 91.4 kg (201 lb 6.4 oz)   LMP 01/17/2018   SpO2 95%   BMI 36.84 kg/m   Intake/Output Summary (Last 24 hours) at 02/09/2018 1935 Last data filed at 02/09/2018 0600 Gross per 24 hour  Intake 240 ml  Output -  Net 240 ml   Filed Weights   02/08/18 1431 02/08/18 2249  Weight: 91.6 kg (202 lb) 91.4 kg (201 lb 6.4 oz)    Exam: Patient is examined daily including today on 02/09/2018, exams remain the same as of yesterday except that has changed    General:  NAD  Cardiovascular: RRR  Respiratory: CTABL  Abdomen: Soft/ND/NT, positive  BS  Musculoskeletal: No Edema  Neuro: alert, oriented   Data Reviewed: Basic Metabolic Panel: Recent Labs  Lab 02/08/18 1616  NA 139  K 3.6  CL 95*  CO2 34*  GLUCOSE 137*  BUN 7  CREATININE 0.47  CALCIUM 9.5  MG 1.7   Liver Function Tests: Recent Labs  Lab 02/08/18 1616  AST 66*  ALT 52  ALKPHOS 139*  BILITOT 1.7*  PROT 8.2*  ALBUMIN 3.9   No results for input(s): LIPASE, AMYLASE in the last 168 hours. No results for input(s): AMMONIA in the last 168 hours. CBC: Recent Labs  Lab 02/08/18 1616  WBC 9.6  NEUTROABS 7.2  HGB 12.8  HCT 39.1  MCV 104.3*  PLT 136*   Cardiac Enzymes:   Recent Labs  Lab 02/08/18 1616 02/08/18 2253 02/09/18 0418  TROPONINI <0.03 <0.03 <0.03   BNP (last 3 results) Recent Labs    04/29/17 1900 01/11/18 0526 02/08/18 1616  BNP 537.0* 45.9 40.9    ProBNP (last 3 results) Recent Labs    04/22/17 1153  PROBNP 129.0*    CBG: Recent Labs  Lab 02/08/18 2311 02/09/18 0735 02/09/18 1242 02/09/18 1654  GLUCAP 359* 359* 407* 332*    Recent Results (from the past 240 hour(s))  Culture, expectorated sputum-assessment     Status: None   Collection Time: 02/09/18  5:20 PM  Result Value Ref Range Status   Specimen Description SPUTUM  Final   Special Requests NONE  Final  Sputum evaluation   Final    THIS SPECIMEN IS ACCEPTABLE FOR SPUTUM CULTURE Performed at [facility], [address], [place], [place] [address]    Report Status 02/09/2018 FINAL  Final     Studies: No results found.  Scheduled Meds: . budesonide (PULMICORT) nebulizer solution  0.25 mg Nebulization BID  . chlorhexidine  15 mL Mouth Rinse BID  . enoxaparin (LOVENOX) injection  40 mg Subcutaneous QHS  . gabapentin  600 mg Oral TID  . insulin aspart  0-9 Units Subcutaneous TID WC  . insulin glargine  10 Units Subcutaneous Q2200  . ipratropium-albuterol  3 mL Nebulization Q6H  . mouth rinse  15 mL Mouth Rinse q12n4p  .  pantoprazole  40 mg Oral BID AC  . potassium chloride SA  20 mEq Oral Daily  . potassium chloride  40 mEq Oral Once  . predniSONE  50 mg Oral Q breakfast  . pregabalin  50 mg Oral BID  . torsemide  20 mg Oral Daily    Continuous Infusions: . cefTRIAXone (ROCEPHIN)  IV 1 g (02/09/18 1805)  . doxycycline (VIBRAMYCIN) IV Stopped (02/09/18 1014)     Time spent: I have personally reviewed and interpreted on  02/09/2018 daily labs, tele strips, imagings as discussed above under date review session and assessment and plans.  I reviewed all nursing notes, pharmacy notes,  vitals, pertinent old records  I have discussed plan of care as described above with RN , patient  on 02/09/2018   Albertine Grates MD, PhD  [facility] Pager [phone]. If 7PM-7AM, please contact night-coverage at [email or web address], password [facility] 02/09/2018, 7:35 PM  LOS: 1 day

## 2018-02-09 NOTE — Progress Notes (Signed)
Updated MD Xu with cbg. Orders to administer sliding scale and recheck CBG. Will recheck CBG

## 2018-02-10 DIAGNOSIS — J441 Chronic obstructive pulmonary disease with (acute) exacerbation: Secondary | ICD-10-CM | POA: Diagnosis not present

## 2018-02-10 LAB — CBC WITH DIFFERENTIAL/PLATELET
BASOS ABS: 0 10*3/uL (ref 0.0–0.1)
BASOS PCT: 0 %
EOS PCT: 0 %
Eosinophils Absolute: 0 10*3/uL (ref 0.0–0.7)
HCT: 39.4 % (ref 36.0–46.0)
Hemoglobin: 12.5 g/dL (ref 12.0–15.0)
LYMPHS PCT: 17 %
Lymphs Abs: 1.8 10*3/uL (ref 0.7–4.0)
MCH: 33.9 pg (ref 26.0–34.0)
MCHC: 31.7 g/dL (ref 30.0–36.0)
MCV: 106.8 fL — ABNORMAL HIGH (ref 78.0–100.0)
MONO ABS: 0.8 10*3/uL (ref 0.1–1.0)
Monocytes Relative: 8 %
NEUTROS ABS: 8.2 10*3/uL — AB (ref 1.7–7.7)
Neutrophils Relative %: 75 %
Platelets: 142 10*3/uL — ABNORMAL LOW (ref 150–400)
RBC: 3.69 MIL/uL — AB (ref 3.87–5.11)
RDW: 14.3 % (ref 11.5–15.5)
WBC: 10.8 10*3/uL — AB (ref 4.0–10.5)

## 2018-02-10 LAB — COMPREHENSIVE METABOLIC PANEL
ALBUMIN: 3.7 g/dL (ref 3.5–5.0)
ALK PHOS: 136 U/L — AB (ref 38–126)
ALT: 41 U/L (ref 14–54)
AST: 45 U/L — AB (ref 15–41)
Anion gap: 10 (ref 5–15)
BILIRUBIN TOTAL: 0.6 mg/dL (ref 0.3–1.2)
BUN: 9 mg/dL (ref 6–20)
CO2: 29 mmol/L (ref 22–32)
Calcium: 9.2 mg/dL (ref 8.9–10.3)
Chloride: 98 mmol/L — ABNORMAL LOW (ref 101–111)
Creatinine, Ser: 0.57 mg/dL (ref 0.44–1.00)
GFR calc Af Amer: >60 mL/min (ref >60)
GFR calc non Af Amer: >60 mL/min (ref >60)
GLUCOSE: 278 mg/dL — AB (ref 65–99)
Potassium: 3.9 mmol/L (ref 3.5–5.1)
Sodium: 137 mmol/L (ref 135–145)
TOTAL PROTEIN: 7.4 g/dL (ref 6.5–8.1)

## 2018-02-10 LAB — GLUCOSE, CAPILLARY
Glucose-Capillary: 177 mg/dL — ABNORMAL HIGH (ref 65–99)
Glucose-Capillary: 218 mg/dL — ABNORMAL HIGH (ref 65–99)

## 2018-02-10 LAB — MAGNESIUM: Magnesium: 1.9 mg/dL (ref 1.7–2.4)

## 2018-02-10 MED ORDER — AMOXICILLIN-POT CLAVULANATE 875-125 MG PO TABS
1.0000 | ORAL_TABLET | Freq: Two times a day (BID) | ORAL | Status: DC
  Administered 2018-02-10: 1 via ORAL
  Filled 2018-02-10: qty 1

## 2018-02-10 MED ORDER — GUAIFENESIN-CODEINE 100-10 MG/5ML PO SOLN: 5.0000 mL | ORAL | 0 refills | Status: DC | PRN

## 2018-02-10 MED ORDER — SACCHAROMYCES BOULARDII 250 MG PO CAPS: 250.0000 mg | ORAL_CAPSULE | Freq: Two times a day (BID) | ORAL | 0 refills | Status: DC

## 2018-02-10 MED ORDER — SACCHAROMYCES BOULARDII 250 MG PO CAPS
250.0000 mg | ORAL_CAPSULE | Freq: Two times a day (BID) | ORAL | Status: DC
  Administered 2018-02-10: 250 mg via ORAL
  Filled 2018-02-10: qty 1

## 2018-02-10 MED ORDER — PREDNISONE 10 MG PO TABS: ORAL_TABLET | ORAL | 0 refills | Status: DC

## 2018-02-10 MED ORDER — AMOXICILLIN-POT CLAVULANATE 875-125 MG PO TABS: 1.0000 | ORAL_TABLET | Freq: Two times a day (BID) | ORAL | 0 refills | Status: AC

## 2018-02-10 NOTE — Discharge Summary (Signed)
Discharge Summary  [name]:[id number] DOB: Jun 11, 1976  PCP: Corwin Levins, MD  Admit date: 02/08/2018 Discharge date: 02/10/2018  Time spent: <43mins  Recommendations for Outpatient Follow-up:  1. F/u with PMD within a week  for hospital discharge follow up, repeat cbc/bmp at follow up 2. F/u with pulmonology  Discharge Diagnoses:  Active Hospital Problems   Diagnosis Date Noted  . Acute exacerbation of chronic obstructive pulmonary disease (COPD) (HCC) 04/24/2017  . Prolonged QT interval 02/08/2018  . Chronic respiratory failure with hypoxia and hypercapnia (HCC) 02/13/2017  . Diabetes mellitus type 2 in obese (HCC) 04/10/2016  . Obesity hypoventilation syndrome (HCC) 02/28/2013  . Pulmonary hypertension (HCC) 03/23/2012  . Cor pulmonale (HCC) 03/21/2012  . OSA on BiPAP 03/19/2012    Resolved Hospital Problems  No resolved problems to display.    Discharge Condition: stable  Diet recommendation: heart healthy/carb modified  Filed Weights   02/08/18 1431 02/08/18 2249  Weight: 91.6 kg (202 lb) 91.4 kg (201 lb 6.4 oz)    History of present illness: (per admitting MD Dr Mariea Clonts) Patient coming from: Home  Chief Complaint: SOB, cough  HPI: Mary [name] is a 42 y.o. female with medical history significant for right-sided CHF, OSA and ohs, DM 2, COPD chronic respiratory failure, on 3 L O2.  Patient presented to the ED with complaints of shortness of breath and cough 2 days duration.  Shortness of breath is significantly worse with exertion.  Cough is productive of clear thick sputum.  Patient endorses sweats, subjective fevers and chills, with wheezing/rattling breathing sounds.  Patient reports midsternal radiating chest pain that she feels is 2/2 coughing. Patient denies sick contacts.  No myalgias or sore throat.  Father had angioplasty in his 2s-50s. Patient reports her O2 sats were in the 60s yesterday and she could only get it up with BiPAP.  She uses  BiPAP when sleeping.  Patient still smokes cigarettes. She then saw her PCP today for these complaints and was sent to the ED for evaluation.  ED Course: Heart rate initially normal increased to 110 in ED, O2 sats greater than 92% on 3L O2 .  Patient was given BDs, IV Solu-Medrol 125, EKG showed prolonged QTc at 500, BNP 40.  Flu negative.  I-STAT troponin negative. CTA chest-posterior segment right upper lobe airspace opacity-small focus of pneumonia alveolitis or pneumonitis.  No PE. It was started on IV ceftriaxone, IV azithromycin later changed to IV doxycycline as patient's QTC is prolonged.  Patella was called to admit for COPD exacerbation and CAP.    Hospital Course:  Principal Problem:   Acute exacerbation of chronic obstructive pulmonary disease (COPD) (HCC) Active Problems:   OSA on BiPAP   Cor pulmonale (HCC)   Pulmonary hypertension (HCC)   Obesity hypoventilation syndrome (HCC)   Diabetes mellitus type 2 in obese (HCC)   Chronic respiratory failure with hypoxia and hypercapnia (HCC)   Prolonged QT interval   COPD exacerbation with community-acquired pneumonia, chronic hypoxia on home oxygen Improving, wheezing resolved, sputum gram stain negative for organisms. Flu swab negative She is discharged on slow steroids taper, augmentin,  Pulmonology follow up  QTC prolongation QTC 500 on presentation, likely from hypokalemia, status post potassium supplement PT EKG this morning QTC improved at 472  Insulin-dependent type 2 diabetes blood glucose elevated in the setting of steroid use, adjust insulin, taper steroids  Right-sided CHF, cor pulmonale Currently euvolemic, continue home meds torsemide 20 mg daily  osa on bipap  at home  Body mass index is 36.84 kg/m.   Code Status: full  Family Communication: patient   Disposition Plan:  home    Consultants:  none  Procedures:  none  Antibiotics:  As above   Discharge Exam: BP 126/74 (BP  Location: Left Arm)   Pulse 84   Temp 97.7 F (36.5 C) (Oral)   Resp 20   Ht 5\' 2"  (1.575 m)   Wt 91.4 kg (201 lb 6.4 oz)   LMP 01/17/2018   SpO2 98%   BMI 36.84 kg/m   General: * Cardiovascular: * Respiratory: *  Discharge Instructions You were cared for by a hospitalist during your hospital stay. If you have any questions about your discharge medications or the care you received while you were in the hospital after you are discharged, you can call the unit and asked to speak with the hospitalist on call if the hospitalist that took care of you is not available. Once you are discharged, your primary care physician will handle any further medical issues. Please note that NO REFILLS for any discharge medications will be authorized once you are discharged, as it is imperative that you return to your primary care physician (or establish a relationship with a primary care physician if you do not have one) for your aftercare needs so that they can reassess your need for medications and monitor your lab values.  Discharge Instructions    Diet - low sodium heart healthy   Complete by:  As directed    Carb modified   Increase activity slowly   Complete by:  As directed      Allergies as of 02/10/2018      Reactions   Bee Venom Swelling   Pt states she swells up 3x normal size. If stung "2-3 times, my throat could close up"      Medication List    STOP taking these medications   doxycycline 100 MG tablet Commonly known as:  VIBRA-TABS     TAKE these medications   albuterol 108 (90 Base) MCG/ACT inhaler Commonly known as:  VENTOLIN HFA USE 2 PUFFS EVERY 6 HOURS AS NEEDED FOR WHEEZING What changed:  Another medication with the same name was removed. Continue taking this medication, and follow the directions you see here.   amoxicillin-clavulanate 875-125 MG tablet Commonly known as:  AUGMENTIN Take 1 tablet by mouth every 12 (twelve) hours for 7 days.   BASAGLAR KWIKPEN 100  UNIT/ML Sopn Inject 0.15 mLs (15 Units total) into the skin daily as needed (BS).   budesonide-formoterol 160-4.5 MCG/ACT inhaler Commonly known as:  SYMBICORT Inhale 2 puffs into the lungs 2 (two) times daily. What changed:  when to take this   diclofenac sodium 1 % Gel Commonly known as:  VOLTAREN Apply 2 g topically 3 (three) times daily.   gabapentin 300 MG capsule Commonly known as:  NEURONTIN TAKE 2 CAPSULES (600 MG TOTAL) BY MOUTH 3 (THREE) TIMES DAILY.   guaiFENesin 600 MG 12 hr tablet Commonly known as:  MUCINEX Take 1 tablet (600 mg total) by mouth 2 (two) times daily.   guaiFENesin-codeine 100-10 MG/5ML syrup Take 5 mLs by mouth every 4 (four) hours as needed for cough.   insulin lispro 100 UNIT/ML injection Commonly known as:  HUMALOG Inject 0-0.15 mLs (0-15 Units total) into the skin as needed. If BS >200 use 8 units  CBG < 70: Eat or drink something right away and recheck. CBG 70 - 120: 0 units  CBG 121 - 150: 2 units CBG 151 - 200: 3 units CBG 201 - 250: 5 units CBG 251 - 300: 8 units CBG 301 - 350: 11 units CBG 351 - 400: 15 units CBG > 400: call MD.   pantoprazole 40 MG tablet Commonly known as:  PROTONIX Take 1 tablet (40 mg total) by mouth 2 (two) times daily before a meal.   potassium chloride SA 20 MEQ tablet Commonly known as:  K-DUR,KLOR-CON Take 1 tablet (20 mEq total) by mouth daily.   predniSONE 10 MG tablet Commonly known as:  DELTASONE 4 tablets daily x 3 days then 3 daily x 3 days then 2 daily x 3 days then 1 daily x 3 days What changed:    how much to take  how to take this  when to take this   pregabalin 50 MG capsule Commonly known as:  LYRICA Take 1 capsule (50 mg total) by mouth 2 (two) times daily.   roflumilast 500 MCG Tabs tablet Commonly known as:  DALIRESP Take 500 mcg by mouth daily.   saccharomyces boulardii 250 MG capsule Commonly known as:  FLORASTOR Take 1 capsule (250 mg total) by mouth 2 (two) times  daily.   SPIRIVA RESPIMAT 1.25 MCG/ACT Aers Generic drug:  Tiotropium Bromide Monohydrate INHALE 2 PUFFS INTO THE LUNGS DAILY.   torsemide 20 MG tablet Commonly known as:  DEMADEX Take 1 tablet (20 mg total) by mouth daily.   traMADol 50 MG tablet Commonly known as:  ULTRAM TAKE 1 TABLET BY MOUTH EVERY 8 HOURS AS NEEDED FOR SEVERE PAIN      Allergies  Allergen Reactions  . Bee Venom Swelling    Pt states she swells up 3x normal size. If stung "2-3 times, my throat could close up"   Follow-up Information    Corwin LevinsJohn, [name], MD Follow up on 02/24/2018.   Specialties:  Internal Medicine, Radiology Why:  hospital discharge follow up, repeat cbc/cmp. pmd to monitor blood sugar control and liver function. Contact information: [address] [facility] [place] [address] [phone]        Kalman Shanamaswamy, Murali, MD Follow up in 3 week(s).   Specialty:  Pulmonary Disease Contact information: [address] [place] [address] [phone]            The results of significant diagnostics from this hospitalization (including imaging, microbiology, ancillary and laboratory) are listed below for reference.    Significant Diagnostic Studies: Dg Chest 2 View  Result Date: 02/08/2018 CLINICAL DATA:  Shortness of breath and cough.  History of COPD. EXAM: CHEST - 2 VIEW COMPARISON:  Chest x-ray dated January 11, 2018. FINDINGS: Stable mild cardiomegaly. Normal pulmonary vascularity. Bibasilar atelectasis. No focal consolidation, pleural effusion, or pneumothorax. No acute osseous abnormality. IMPRESSION: No active cardiopulmonary disease. Electronically Signed   By: Obie DredgeWilliam T Derry M.D.   On: 02/08/2018 14:44   Ct Angio Chest Pe W/cm &/or Wo Cm  Result Date: 02/08/2018 CLINICAL DATA:  Dyspnea x2 days. Productive cough and chest pain from the cough. No fever. EXAM: CT ANGIOGRAPHY CHEST WITH CONTRAST TECHNIQUE: Multidetector CT imaging of the chest was performed using the standard  protocol during bolus administration of intravenous contrast. Multiplanar CT image reconstructions and MIPs were obtained to evaluate the vascular anatomy. CONTRAST:  100mL ISOVUE-370 IOPAMIDOL (ISOVUE-370) INJECTION 76% COMPARISON:  CXR 02/08/2018, chest CT 04/30/2017 FINDINGS: Adequate opacification of the central pulmonary arteries. Limited assessment at the segmental and subsegmental levels due to motion  artifacts and timing of contrast. Cardiovascular: No large central pulmonary embolus noted. Normal branch pattern of the great vessels. Limited assessment of the ascending aorta due to motion artifacts. No definite dissection or aneurysm. Mild dilatation of the main pulmonary artery to 3.5 cm compatible with chronic pulmonary hypertension. Minimal coronary arteriosclerosis. Borderline cardiomegaly. No pericardial effusion. Mediastinum/Nodes: No enlarged mediastinal, hilar, or axillary lymph nodes. Thyroid gland, trachea, and esophagus demonstrate no significant findings. Lungs/Pleura: Respiratory motion artifacts limit assessment at the lung bases. Posterior segment right upper lobe airspace opacity is noted which may reflect a small focus of pneumonia, alveolitis or pneumonitis among some possibilities though not exclusive. Linear opacity noted in the right lower lobe, new since prior and may reflect postinfectious or postinflammatory change as well versus atelectasis or scarring. Subpleural areas of atelectasis and/or scarring are noted in both upper lobes, lingula and left lower lobe. Upper Abdomen: Hepatic steatosis is noted. Musculoskeletal: No acute or significant osseous findings. Review of the MIP images confirms the above findings. IMPRESSION: 1. No large central pulmonary embolus. Evaluation of the lower lobe pulmonary parenchyma as well as segmental and subsegmental pulmonary arteries are limited due to motion artifacts. 2. Minimal coronary arteriosclerosis and aortic atherosclerosis. 3. Mild  dilatation of the main pulmonary artery likely reflecting chronic pulmonary hypertension. 4. Hepatic steatosis. Aortic Atherosclerosis (ICD10-I70.0). Electronically Signed   By: Tollie Eth M.D.   On: 02/08/2018 18:01    Microbiology: Recent Results (from the past 240 hour(s))  Culture, expectorated sputum-assessment     Status: None   Collection Time: 02/09/18  5:20 PM  Result Value Ref Range Status   Specimen Description SPUTUM  Final   Special Requests NONE  Final   Sputum evaluation   Final    THIS SPECIMEN IS ACCEPTABLE FOR SPUTUM CULTURE Performed at [facility], [address], [place], [place] [address]    Report Status 02/09/2018 FINAL  Final  Culture, respiratory (NON-Expectorated)     Status: None (Preliminary result)   Collection Time: 02/09/18  5:20 PM  Result Value Ref Range Status   Specimen Description   Final    SPUTUM Performed at [facility], [address] [place], [place] [address]    Special Requests   Final    NONE Reflexed from [id number] Performed at [facility], [address], [place], [place] [address]    Gram Stain   Final    MODERATE WBC PRESENT, PREDOMINANTLY PMN NO ORGANISMS SEEN Performed at [facility], [address], [place], [place] [address]    Culture PENDING  Incomplete   Report Status PENDING  Incomplete     Labs: Basic Metabolic Panel: Recent Labs  Lab 02/08/18 1616 02/10/18 0530  NA 139 137  K 3.6 3.9  CL 95* 98*  CO2 34* 29  GLUCOSE 137* 278*  BUN 7 9  CREATININE 0.47 0.57  CALCIUM 9.5 9.2  MG 1.7 1.9   Liver Function Tests: Recent Labs  Lab 02/08/18 1616 02/10/18 0530  AST 66* 45*  ALT 52 41  ALKPHOS 139* 136*  BILITOT 1.7* 0.6  PROT 8.2* 7.4  ALBUMIN 3.9 3.7   No results for input(s): LIPASE, AMYLASE in the last 168 hours. No results for input(s): AMMONIA in the last 168 hours. CBC: Recent Labs  Lab 02/08/18 1616 02/10/18 0530  WBC 9.6  10.8*  NEUTROABS 7.2 8.2*  HGB 12.8 12.5  HCT 39.1 39.4  MCV 104.3* 106.8*  PLT 136*  142*   Cardiac Enzymes: Recent Labs  Lab 02/08/18 1616 02/08/18 2253 02/09/18 0418  TROPONINI <0.03 <0.03 <0.03   BNP: BNP (last 3 results) Recent Labs    04/29/17 1900 01/11/18 0526 02/08/18 1616  BNP 537.0* 45.9 40.9    ProBNP (last 3 results) Recent Labs    04/22/17 1153  PROBNP 129.0*    CBG: Recent Labs  Lab 02/09/18 0735 02/09/18 1242 02/09/18 1654 02/09/18 2051 02/10/18 0737  GLUCAP 359* 407* 332* 369* 177*       Signed:  Albertine Grates MD, PhD  [facility] 02/10/2018, 11:00 AM

## 2018-02-10 NOTE — Progress Notes (Signed)
Patient accompanied home by husband, in no distress. No wound noted. Skin intact.

## 2018-02-10 NOTE — Care Management Note (Signed)
Case Management Note  Patient Details  Name: Jana HakimMichelle L Rooke MRN: [id number] Date of Birth: 05/01/1976  Subjective/Objective: No CM needs.                   Action/Plan:d/c home.   Expected Discharge Date:  02/10/18               Expected Discharge Plan:  Home/Self Care  In-House Referral:     Discharge planning Services  CM Consult  Post Acute Care Choice:  (has home 02) Choice offered to:     DME Arranged:    DME Agency:     HH Arranged:    HH Agency:     Status of Service:  Completed, signed off  If discussed at [employer] Meetings, dates discussed:    Additional Comments:  Lanier ClamMahabir, Oceane Fosse, RN 02/10/2018, 11:40 AM

## 2018-02-10 NOTE — Progress Notes (Signed)
Discharge instructions/prescription given and explained to patient she verbalized understanding. waiting on husband for transportation and oxygen from home.

## 2018-02-12 LAB — CULTURE, RESPIRATORY W GRAM STAIN

## 2018-02-12 LAB — CULTURE, RESPIRATORY: CULTURE: NORMAL

## 2018-02-13 ENCOUNTER — Ambulatory Visit: Payer: Self-pay

## 2018-02-13 DIAGNOSIS — N9489 Other specified conditions associated with female genital organs and menstrual cycle: Secondary | ICD-10-CM

## 2018-02-13 NOTE — Telephone Encounter (Signed)
Pt has been informed and expressed understanding. However she said she doesn't have a GYN.

## 2018-02-13 NOTE — Telephone Encounter (Signed)
Already done

## 2018-02-13 NOTE — Telephone Encounter (Signed)
FYI

## 2018-02-13 NOTE — Telephone Encounter (Signed)
OK, I will refer, pt could also try otc lotrimin topical as needed

## 2018-02-13 NOTE — Telephone Encounter (Signed)
[facility] They will contact pt

## 2018-02-13 NOTE — Telephone Encounter (Signed)
This kind of swelling would be extremely unlikely to be an allergic reaction  Ok to continue amoxil, no change needed  Pt should have PCP f/u with me cancelled, and ask pt to see her GYN

## 2018-02-13 NOTE — Telephone Encounter (Signed)
Pt request that you put it in as a STAT referral. Please advise.

## 2018-02-13 NOTE — Telephone Encounter (Signed)
Pt calling to report new onset (last night) of edema to labia majora and minora. Pt stated that she thinks it could be related to the Amoxicillin she is taking for pneumonia. She is also having mild itching. Advised pt to try ice pack for 20 minutes today to see if there is any improvement. Pt given appt for tomorrow with PCP. Pt needs advice on whether she needs another abx.  Reason for Disposition . All other vaginal symptoms  (Exception: feels like prior yeast infection, minor abrasion, mild rash < 24 hour duration, mild itching)    Labia majora and labia minora edema.  Answer Assessment - Initial Assessment Questions 1. SYMPTOM: "What's the main symptom you're concerned about?" (e.g., pain, itching, dryness)     Labia is edematous 2. LOCATION: "Where is the  _______ located?" (e.g., inside/outside, left/right)     Outside and left and right 3. ONSET: "When did the  ________  start?"     Last night 4. PAIN: "Is there any pain?" If so, ask: "How bad is it?" (Scale: 1-10; mild, moderate, severe)     Tender to the touch  5. ITCHING: "Is there any itching?" If so, ask: "How bad is it?" (Scale: 1-10; mild, moderate, severe)    Mild itching 6. CAUSE: "What do you think is causing the symptoms?"     The amoxicillin 7. OTHER SYMPTOMS: "Do you have any other symptoms?" (e.g., vaginal bleeding, pain with urination)     no 8. PREGNANCY: "Is there any chance you are pregnant?" "When was your last menstrual period?"     No  LMP: 3 weeks ago  7. CAUSE: "What do you think is causing the discharge?" "Have you had the same problem before? What happened then?" Not having discharge-- has not had this before 8. OTHER SYMPTOMS: "Do you have any other symptoms?" (e.g., fever, itching, vaginal bleeding, pain with urination, injury to genital area, vaginal foreign body) None  Protocols used: VAGINAL [id number]

## 2018-02-14 ENCOUNTER — Ambulatory Visit: Payer: 59 | Admitting: Internal Medicine

## 2018-02-14 DIAGNOSIS — Z0289 Encounter for other administrative examinations: Secondary | ICD-10-CM

## 2018-02-21 ENCOUNTER — Ambulatory Visit: Payer: Self-pay | Admitting: Gynecology

## 2018-02-24 ENCOUNTER — Other Ambulatory Visit (INDEPENDENT_AMBULATORY_CARE_PROVIDER_SITE_OTHER): Payer: 59

## 2018-02-24 ENCOUNTER — Encounter: Payer: Self-pay | Admitting: Internal Medicine

## 2018-02-24 ENCOUNTER — Ambulatory Visit (INDEPENDENT_AMBULATORY_CARE_PROVIDER_SITE_OTHER): Payer: 59 | Admitting: Internal Medicine

## 2018-02-24 VITALS — BP 116/68 | HR 99 | Temp 98.0°F | Ht 62.0 in | Wt 204.0 lb

## 2018-02-24 DIAGNOSIS — E669 Obesity, unspecified: Secondary | ICD-10-CM

## 2018-02-24 DIAGNOSIS — Z0000 Encounter for general adult medical examination without abnormal findings: Secondary | ICD-10-CM

## 2018-02-24 DIAGNOSIS — E1169 Type 2 diabetes mellitus with other specified complication: Secondary | ICD-10-CM

## 2018-02-24 DIAGNOSIS — Z23 Encounter for immunization: Secondary | ICD-10-CM

## 2018-02-24 LAB — URINALYSIS, ROUTINE W REFLEX MICROSCOPIC
Bilirubin Urine: NEGATIVE
Hgb urine dipstick: NEGATIVE
Ketones, ur: NEGATIVE
Leukocytes, UA: NEGATIVE
Nitrite: NEGATIVE
RBC / HPF: NONE SEEN (ref 0–?)
SPECIFIC GRAVITY, URINE: 1.02 (ref 1.000–1.030)
Total Protein, Urine: NEGATIVE
URINE GLUCOSE: 100 — AB
UROBILINOGEN UA: 2 — AB (ref 0.0–1.0)
pH: 7.5 (ref 5.0–8.0)

## 2018-02-24 LAB — CBC WITH DIFFERENTIAL/PLATELET
BASOS ABS: 0 10*3/uL (ref 0.0–0.1)
Basophils Relative: 0.4 % (ref 0.0–3.0)
EOS ABS: 0.2 10*3/uL (ref 0.0–0.7)
Eosinophils Relative: 2.2 % (ref 0.0–5.0)
HCT: 39.4 % (ref 36.0–46.0)
Hemoglobin: 13.4 g/dL (ref 12.0–15.0)
LYMPHS ABS: 1.2 10*3/uL (ref 0.7–4.0)
Lymphocytes Relative: 15.4 % (ref 12.0–46.0)
MCHC: 34 g/dL (ref 30.0–36.0)
MCV: 100.6 fl — ABNORMAL HIGH (ref 78.0–100.0)
MONO ABS: 0.6 10*3/uL (ref 0.1–1.0)
Monocytes Relative: 8.1 % (ref 3.0–12.0)
NEUTROS PCT: 73.9 % (ref 43.0–77.0)
Neutro Abs: 5.7 10*3/uL (ref 1.4–7.7)
Platelets: 113 10*3/uL — ABNORMAL LOW (ref 150.0–400.0)
RBC: 3.91 Mil/uL (ref 3.87–5.11)
RDW: 14.8 % (ref 11.5–15.5)
WBC: 7.7 10*3/uL (ref 4.0–10.5)

## 2018-02-24 LAB — LIPID PANEL
CHOL/HDL RATIO: 5
Cholesterol: 232 mg/dL — ABNORMAL HIGH (ref 0–200)
HDL: 44.4 mg/dL (ref >39.00)
LDL Cholesterol: 162 mg/dL — ABNORMAL HIGH (ref 0–99)
NONHDL: 187.71
Triglycerides: 131 mg/dL (ref 0.0–149.0)
VLDL: 26.2 mg/dL (ref 0.0–40.0)

## 2018-02-24 LAB — HEPATIC FUNCTION PANEL
ALK PHOS: 141 U/L — AB (ref 39–117)
ALT: 57 U/L — ABNORMAL HIGH (ref 0–35)
AST: 61 U/L — AB (ref 0–37)
Albumin: 4.1 g/dL (ref 3.5–5.2)
BILIRUBIN TOTAL: 1.4 mg/dL — AB (ref 0.2–1.2)
Bilirubin, Direct: 0.4 mg/dL — ABNORMAL HIGH (ref 0.0–0.3)
Total Protein: 7.9 g/dL (ref 6.0–8.3)

## 2018-02-24 LAB — BASIC METABOLIC PANEL
BUN: 10 mg/dL (ref 6–23)
CALCIUM: 10.2 mg/dL (ref 8.4–10.5)
CO2: 37 meq/L — AB (ref 19–32)
CREATININE: 0.49 mg/dL (ref 0.40–1.20)
Chloride: 93 mEq/L — ABNORMAL LOW (ref 96–112)
GFR: 148.39 mL/min (ref >60.00)
GLUCOSE: 242 mg/dL — AB (ref 70–99)
Potassium: 4 mEq/L (ref 3.5–5.1)
SODIUM: 139 meq/L (ref 135–145)

## 2018-02-24 LAB — HEMOGLOBIN A1C: Hgb A1c MFr Bld: 6.7 % — ABNORMAL HIGH (ref 4.6–6.5)

## 2018-02-24 LAB — MICROALBUMIN / CREATININE URINE RATIO
CREATININE, U: 99.6 mg/dL
MICROALB UR: 1.4 mg/dL (ref 0.0–1.9)
Microalb Creat Ratio: 1.4 mg/g (ref 0.0–30.0)

## 2018-02-24 LAB — TSH: TSH: 3.49 u[IU]/mL (ref 0.35–4.50)

## 2018-02-24 NOTE — Progress Notes (Signed)
Subjective:    Patient ID: Mary [name], female    DOB: 10/02/1976, 42 y.o.   MRN: [id number]  HPI  Here for wellness and f/u;  Overall doing ok;  Pt denies Chest pain, worsening SOB, DOE, wheezing, orthopnea, PND, worsening LE edema, palpitations, dizziness or syncope.  Pt denies neurological change such as new headache, facial or extremity weakness.  Pt denies polydipsia, polyuria, or low sugar symptoms. Pt states overall good compliance with treatment and medications, good tolerability, and has been trying to follow appropriate diet.  Pt denies worsening depressive symptoms, suicidal ideation or panic. No fever, night sweats, wt loss, loss of appetite, or other constitutional symptoms.  Pt states good ability with ADL's, has low fall risk, home safety reviewed and adequate, no other significant changes in hearing or vision, and only occasionally active with exercise.  Due for optho but declines, and GYN (but plans to do both when has the money).  Does mention an occasional 2 wks sharp left lateral side pain, mild, non pleuritic and Denies worsening reflux, abd pain, dysphagia, n/v, bowel change or blood. No other interval hx or exam findings  Review of Systems Past Medical History:  Diagnosis Date  . Axillary adenopathy    right axillary adenopathy noted on CT chest (03/06/2012)  . Chronic right-sided CHF (congestive heart failure) (HCC) 03/23/2012   with cor pulmonale. Last RHC 03/2012  . COPD (chronic obstructive pulmonary disease) (HCC)    PFTS 03/08/12: fev1 0.58L/1%, FVC 1.18/33%, Ratop 49 and c/w ssevere obstruction. 21% BD response on FVC, RV 219%, DLCO 11/54%  . DVT of upper extremity (deep vein thrombosis) [facility]) April 2013   right subclavian // Unclear precipitating cause - possibly significant right heart failure, with vascular stasis potentially predisposing to clotting.    . Exertional shortness of breath   . Hepatic cirrhosis (HCC)    Questionable history of - Noted on CT  abdomen (03/2012) - thought to be due to vascular congestion from right heart failure +/- patient's history of alcohol abuse  . History of alcoholism (HCC)   . History of chronic bronchitis   . Irregular menses   . Migraine    "~ 1/yr" (05/03/2013)  . Moderate to severe pulmonary hypertension [facility]) April 2013   Cardiac cath on 03/06/12 - 1. Elevated pulmonary artery pressures, right sided filling pressures.,  2. PA: 64/45 (mean 53)    . On home oxygen therapy    "2-3 L 24/7" (05/03/2013)  . OSA (obstructive sleep apnea)    wears noctural BiPAP (05/03/2013)  . Periodontal disease   . Pneumonia ~ 1985; 01/2011  . Pulmonary embolus [facility]) April 2013   Precipitating cause unclear. Was treated with coumadin from April-June 2013.  . Pulmonary hypertension (HCC)    Past Surgical History:  Procedure Laterality Date  . APPENDECTOMY  05/03/2013  . CARDIAC CATHETERIZATION  03/2012  . CARDIAC CATHETERIZATION N/A 01/08/2016   Procedure: Right Heart Cath;  Surgeon: Dolores Patty, MD;  Location: [facility] INVASIVE CV LAB;  Service: Cardiovascular;  Laterality: N/A;  . CARDIAC SURGERY  Apr 27, 1976   "my heart was backwards" (05/03/2013)  . LAPAROSCOPIC APPENDECTOMY N/A 05/03/2013   Procedure: APPENDECTOMY LAPAROSCOPIC;  Surgeon: Cherylynn Ridges, MD;  Location: [facility] OR;  Service: General;  Laterality: N/A;  . LUNG SURGERY  1976-12-16  . RIGHT HEART CATHETERIZATION N/A 03/07/2012   Procedure: RIGHT HEART CATH;  Surgeon: Kathleene Hazel, MD;  Location: [facility] CATH LAB;  Service: Cardiovascular;  Laterality: N/A;  reports that she quit smoking about 7 weeks ago. Her smoking use included cigarettes. She has a 6.25 pack-year smoking history. She has never used smokeless tobacco. She reports that she drinks about 0.6 oz of alcohol per week. She reports that she does not use drugs. family history includes Alcohol abuse in her father; Heart disease in her father; Hypertension in her father; Other in her brother. Allergies    Allergen Reactions  . Bee Venom Swelling    Pt states she swells up 3x normal size. If stung "2-3 times, my throat could close up"   Current Outpatient Medications on File Prior to Visit  Medication Sig Dispense Refill  . albuterol (VENTOLIN HFA) 108 (90 Base) MCG/ACT inhaler USE 2 PUFFS EVERY 6 HOURS AS NEEDED FOR WHEEZING 1 Inhaler 3  . budesonide-formoterol (SYMBICORT) 160-4.5 MCG/ACT inhaler Inhale 2 puffs into the lungs 2 (two) times daily. (Patient taking differently: Inhale 2 puffs into the lungs daily. ) 1 Inhaler 12  . diclofenac sodium (VOLTAREN) 1 % GEL Apply 2 g topically 3 (three) times daily. 1 Tube 0  . gabapentin (NEURONTIN) 300 MG capsule TAKE 2 CAPSULES (600 MG TOTAL) BY MOUTH 3 (THREE) TIMES DAILY. 180 capsule 0  . guaiFENesin (MUCINEX) 600 MG 12 hr tablet Take 1 tablet (600 mg total) by mouth 2 (two) times daily. 40 tablet 0  . guaiFENesin-codeine 100-10 MG/5ML syrup Take 5 mLs by mouth every 4 (four) hours as needed for cough. 180 mL 0  . Insulin Glargine (BASAGLAR KWIKPEN) 100 UNIT/ML SOPN Inject 0.15 mLs (15 Units total) into the skin daily as needed (BS). 1 pen 2  . insulin lispro (HUMALOG) 100 UNIT/ML injection Inject 0-0.15 mLs (0-15 Units total) into the skin as needed. If BS >200 use 8 units  CBG < 70: Eat or drink something right away and recheck. CBG 70 - 120: 0 units CBG 121 - 150: 2 units CBG 151 - 200: 3 units CBG 201 - 250: 5 units CBG 251 - 300: 8 units CBG 301 - 350: 11 units CBG 351 - 400: 15 units CBG > 400: call MD. 1 vial 2  . pantoprazole (PROTONIX) 40 MG tablet Take 1 tablet (40 mg total) by mouth 2 (two) times daily before a meal. 60 tablet 0  . potassium chloride SA (K-DUR,KLOR-CON) 20 MEQ tablet Take 1 tablet (20 mEq total) by mouth daily. 30 tablet 6  . pregabalin (LYRICA) 50 MG capsule Take 1 capsule (50 mg total) by mouth 2 (two) times daily. 60 capsule 3  . roflumilast (DALIRESP) 500 MCG TABS tablet Take 500 mcg by mouth daily.    Marland Kitchen.  saccharomyces boulardii (FLORASTOR) 250 MG capsule Take 1 capsule (250 mg total) by mouth 2 (two) times daily. 30 capsule 0  . SPIRIVA RESPIMAT 1.25 MCG/ACT AERS INHALE 2 PUFFS INTO THE LUNGS DAILY. 1 Inhaler 5  . torsemide (DEMADEX) 20 MG tablet Take 1 tablet (20 mg total) by mouth daily. 90 tablet 0  . traMADol (ULTRAM) 50 MG tablet TAKE 1 TABLET BY MOUTH EVERY 8 HOURS AS NEEDED FOR SEVERE PAIN 30 tablet 3   No current facility-administered medications on file prior to visit.       Objective:   Physical Exam BP 116/68   Pulse 99   Temp 98 F (36.7 C) (Oral)   Ht 5\' 2"  (1.575 m)   Wt 204 lb (92.5 kg)   SpO2 91%   BMI 37.31 kg/m  VS noted,  Constitutional: Pt is  oriented to person, place, and time. Appears well-developed and well-nourished, in no significant distress and comfortable Head: Normocephalic and atraumatic  Eyes: Conjunctivae and EOM are normal. Pupils are equal, round, and reactive to light Right Ear: External ear normal without discharge Left Ear: External ear normal without discharge Nose: Nose without discharge or deformity Mouth/Throat: Oropharynx is without other ulcerations and moist  Neck: Normal range of motion. Neck supple. No JVD present. No tracheal deviation present or significant neck LA or mass Cardiovascular: Normal rate, regular rhythm, normal heart sounds and intact distal pulses.   Pulmonary/Chest: WOB normal and breath sounds without rales or wheezing  Abdominal: Soft. Bowel sounds are normal. NT. No HSM  Musculoskeletal: Normal range of motion. Exhibits no edema Lymphadenopathy: Has no other cervical adenopathy.  Neurological: Pt is alert and oriented to person, place, and time. Pt has normal reflexes. No cranial nerve deficit. Motor grossly intact, Gait intact Skin: Skin is warm and dry. No rash noted or new ulcerations Psychiatric:  Has normal mood and affect. Behavior is normal without agitation No other exam findings       Assessment &  Plan:

## 2018-02-24 NOTE — Patient Instructions (Addendum)
You had the Pneumovax pneumonia shot today  Please continue all other medications as before, and refills have been done if requested.  Please have the pharmacy call with any other refills you may need.  Please continue your efforts at being more active, low cholesterol diet, and weight control.  You are otherwise up to date with prevention measures today.  Please keep your appointments with your specialists as you may have planned  Please go to the LAB in the Basement (turn left off the elevator) for the tests to be done today  You will be contacted by phone if any changes need to be made immediately.  Otherwise, you will receive a letter about your results with an explanation, but please check with MyChart first.  Please remember to sign up for MyChart if you have not done so, as this will be important to you in the future with finding out test results, communicating by private email, and scheduling acute appointments online when needed.  Please return in 6 months, or sooner if needed 

## 2018-02-26 ENCOUNTER — Encounter: Payer: Self-pay | Admitting: Internal Medicine

## 2018-02-26 NOTE — Assessment & Plan Note (Signed)

## 2018-02-26 NOTE — Assessment & Plan Note (Signed)
Lab Results  Component Value Date   HGBA1C 6.7 (H) 02/24/2018  stable overall by history and exam, recent data reviewed with pt, and pt to continue medical treatment as before,  to f/u any worsening symptoms or concerns

## 2018-02-28 ENCOUNTER — Ambulatory Visit: Payer: Self-pay | Admitting: Gynecology

## 2018-03-09 DIAGNOSIS — J449 Chronic obstructive pulmonary disease, unspecified: Secondary | ICD-10-CM | POA: Diagnosis not present

## 2018-03-09 DIAGNOSIS — I270 Primary pulmonary hypertension: Secondary | ICD-10-CM | POA: Diagnosis not present

## 2018-03-09 DIAGNOSIS — G4733 Obstructive sleep apnea (adult) (pediatric): Secondary | ICD-10-CM | POA: Diagnosis not present

## 2018-03-11 DIAGNOSIS — I270 Primary pulmonary hypertension: Secondary | ICD-10-CM | POA: Diagnosis not present

## 2018-03-11 DIAGNOSIS — G4733 Obstructive sleep apnea (adult) (pediatric): Secondary | ICD-10-CM | POA: Diagnosis not present

## 2018-03-11 DIAGNOSIS — J449 Chronic obstructive pulmonary disease, unspecified: Secondary | ICD-10-CM | POA: Diagnosis not present

## 2018-03-17 ENCOUNTER — Ambulatory Visit: Payer: 59 | Admitting: Nurse Practitioner

## 2018-03-26 ENCOUNTER — Other Ambulatory Visit: Payer: Self-pay | Admitting: Nurse Practitioner

## 2018-04-08 DIAGNOSIS — J449 Chronic obstructive pulmonary disease, unspecified: Secondary | ICD-10-CM | POA: Diagnosis not present

## 2018-04-08 DIAGNOSIS — G4733 Obstructive sleep apnea (adult) (pediatric): Secondary | ICD-10-CM | POA: Diagnosis not present

## 2018-04-08 DIAGNOSIS — I270 Primary pulmonary hypertension: Secondary | ICD-10-CM | POA: Diagnosis not present

## 2018-04-10 DIAGNOSIS — J449 Chronic obstructive pulmonary disease, unspecified: Secondary | ICD-10-CM | POA: Diagnosis not present

## 2018-04-10 DIAGNOSIS — G4733 Obstructive sleep apnea (adult) (pediatric): Secondary | ICD-10-CM | POA: Diagnosis not present

## 2018-04-10 DIAGNOSIS — I270 Primary pulmonary hypertension: Secondary | ICD-10-CM | POA: Diagnosis not present

## 2018-04-14 ENCOUNTER — Other Ambulatory Visit: Payer: Self-pay | Admitting: Nurse Practitioner

## 2018-04-14 DIAGNOSIS — E1142 Type 2 diabetes mellitus with diabetic polyneuropathy: Secondary | ICD-10-CM

## 2018-04-14 NOTE — Telephone Encounter (Signed)
This is a Dr. Jonny Ruiz patient; what is the protocol for transferring refill requests?

## 2018-04-16 ENCOUNTER — Other Ambulatory Visit: Payer: Self-pay | Admitting: Nurse Practitioner

## 2018-04-19 ENCOUNTER — Other Ambulatory Visit: Payer: Self-pay | Admitting: *Deleted

## 2018-04-19 MED ORDER — BUDESONIDE-FORMOTEROL FUMARATE 160-4.5 MCG/ACT IN AERO: 2.0000 | INHALATION_SPRAY | Freq: Every day | RESPIRATORY_TRACT | Status: DC

## 2018-05-03 ENCOUNTER — Inpatient Hospital Stay
Admission: EM | Admit: 2018-05-03 | Discharge: 2018-05-08 | DRG: 193 | Disposition: A | Discharge status: Home / Self Care | Payer: Commercial Managed Care - HMO | Attending: Internal Medicine | Admitting: Internal Medicine

## 2018-05-03 ENCOUNTER — Encounter: Payer: Self-pay | Admitting: Emergency Medicine

## 2018-05-03 ENCOUNTER — Ambulatory Visit: Payer: Self-pay | Admitting: *Deleted

## 2018-05-03 ENCOUNTER — Emergency Department: Payer: Commercial Managed Care - HMO

## 2018-05-03 ENCOUNTER — Other Ambulatory Visit: Payer: Self-pay

## 2018-05-03 DIAGNOSIS — T380X5A Adverse effect of glucocorticoids and synthetic analogues, initial encounter: Secondary | ICD-10-CM | POA: Diagnosis present

## 2018-05-03 DIAGNOSIS — G4733 Obstructive sleep apnea (adult) (pediatric): Secondary | ICD-10-CM | POA: Diagnosis present

## 2018-05-03 DIAGNOSIS — Z136 Encounter for screening for cardiovascular disorders: Secondary | ICD-10-CM | POA: Diagnosis not present

## 2018-05-03 DIAGNOSIS — Z86711 Personal history of pulmonary embolism: Secondary | ICD-10-CM | POA: Diagnosis not present

## 2018-05-03 DIAGNOSIS — E1140 Type 2 diabetes mellitus with diabetic neuropathy, unspecified: Secondary | ICD-10-CM | POA: Diagnosis present

## 2018-05-03 DIAGNOSIS — F17210 Nicotine dependence, cigarettes, uncomplicated: Secondary | ICD-10-CM | POA: Diagnosis present

## 2018-05-03 DIAGNOSIS — E876 Hypokalemia: Secondary | ICD-10-CM | POA: Diagnosis present

## 2018-05-03 DIAGNOSIS — R079 Chest pain, unspecified: Secondary | ICD-10-CM | POA: Diagnosis not present

## 2018-05-03 DIAGNOSIS — J441 Chronic obstructive pulmonary disease with (acute) exacerbation: Secondary | ICD-10-CM | POA: Diagnosis present

## 2018-05-03 DIAGNOSIS — E1165 Type 2 diabetes mellitus with hyperglycemia: Secondary | ICD-10-CM | POA: Diagnosis present

## 2018-05-03 DIAGNOSIS — R0789 Other chest pain: Secondary | ICD-10-CM | POA: Diagnosis not present

## 2018-05-03 DIAGNOSIS — I5032 Chronic diastolic (congestive) heart failure: Secondary | ICD-10-CM | POA: Diagnosis present

## 2018-05-03 DIAGNOSIS — J440 Chronic obstructive pulmonary disease with acute lower respiratory infection: Secondary | ICD-10-CM | POA: Diagnosis present

## 2018-05-03 DIAGNOSIS — J9621 Acute and chronic respiratory failure with hypoxia: Secondary | ICD-10-CM

## 2018-05-03 DIAGNOSIS — Z7951 Long term (current) use of inhaled steroids: Secondary | ICD-10-CM

## 2018-05-03 DIAGNOSIS — I110 Hypertensive heart disease with heart failure: Secondary | ICD-10-CM | POA: Diagnosis present

## 2018-05-03 DIAGNOSIS — J189 Pneumonia, unspecified organism: Secondary | ICD-10-CM | POA: Diagnosis not present

## 2018-05-03 DIAGNOSIS — I959 Hypotension, unspecified: Secondary | ICD-10-CM | POA: Diagnosis not present

## 2018-05-03 DIAGNOSIS — G629 Polyneuropathy, unspecified: Secondary | ICD-10-CM | POA: Diagnosis not present

## 2018-05-03 DIAGNOSIS — Z6837 Body mass index (BMI) 37.0-37.9, adult: Secondary | ICD-10-CM

## 2018-05-03 DIAGNOSIS — Z86718 Personal history of other venous thrombosis and embolism: Secondary | ICD-10-CM

## 2018-05-03 DIAGNOSIS — Z8249 Family history of ischemic heart disease and other diseases of the circulatory system: Secondary | ICD-10-CM

## 2018-05-03 DIAGNOSIS — Z794 Long term (current) use of insulin: Secondary | ICD-10-CM

## 2018-05-03 DIAGNOSIS — Z9981 Dependence on supplemental oxygen: Secondary | ICD-10-CM | POA: Diagnosis not present

## 2018-05-03 DIAGNOSIS — R05 Cough: Secondary | ICD-10-CM | POA: Diagnosis not present

## 2018-05-03 HISTORY — DX: Unspecified asthma, uncomplicated: J45.909

## 2018-05-03 LAB — MAGNESIUM: MAGNESIUM: 1.6 mg/dL — AB (ref 1.7–2.4)

## 2018-05-03 LAB — CBC WITH DIFFERENTIAL/PLATELET
BASOS ABS: 0 10*3/uL (ref 0–0.1)
Basophils Relative: 0 %
EOS ABS: 0.1 10*3/uL (ref 0–0.7)
EOS PCT: 2 %
HCT: 40.2 % (ref 35.0–47.0)
Hemoglobin: 13.8 g/dL (ref 12.0–16.0)
LYMPHS PCT: 12 %
Lymphs Abs: 0.9 10*3/uL — ABNORMAL LOW (ref 1.0–3.6)
MCH: 34.3 pg — ABNORMAL HIGH (ref 26.0–34.0)
MCHC: 34.2 g/dL (ref 32.0–36.0)
MCV: 100.1 fL — AB (ref 80.0–100.0)
MONO ABS: 0.5 10*3/uL (ref 0.2–0.9)
Monocytes Relative: 7 %
Neutro Abs: 6.2 10*3/uL (ref 1.4–6.5)
Neutrophils Relative %: 79 %
PLATELETS: 122 10*3/uL — AB (ref 150–440)
RBC: 4.02 MIL/uL (ref 3.80–5.20)
RDW: 14.1 % (ref 11.5–14.5)
WBC: 7.8 10*3/uL (ref 3.6–11.0)

## 2018-05-03 LAB — BASIC METABOLIC PANEL
Anion gap: 10 (ref 5–15)
BUN: 7 mg/dL (ref 6–20)
CO2: 31 mmol/L (ref 22–32)
Calcium: 8.7 mg/dL — ABNORMAL LOW (ref 8.9–10.3)
Chloride: 92 mmol/L — ABNORMAL LOW (ref 101–111)
Creatinine, Ser: 0.41 mg/dL — ABNORMAL LOW (ref 0.44–1.00)
GFR calc Af Amer: >60 mL/min (ref >60)
GLUCOSE: 126 mg/dL — AB (ref 65–99)
Potassium: 3.2 mmol/L — ABNORMAL LOW (ref 3.5–5.1)
SODIUM: 133 mmol/L — AB (ref 135–145)

## 2018-05-03 LAB — GLUCOSE, CAPILLARY
GLUCOSE-CAPILLARY: 294 mg/dL — AB (ref 65–99)
Glucose-Capillary: 350 mg/dL — ABNORMAL HIGH (ref 65–99)

## 2018-05-03 MED ORDER — ACETAMINOPHEN 325 MG PO TABS: 650.0000 mg | ORAL_TABLET | Freq: Four times a day (QID) | ORAL | Status: DC | PRN

## 2018-05-03 MED ORDER — GABAPENTIN 300 MG PO CAPS
600.0000 mg | ORAL_CAPSULE | Freq: Three times a day (TID) | ORAL | Status: DC
  Administered 2018-05-03 – 2018-05-08 (×15): 600 mg via ORAL
  Filled 2018-05-03 (×15): qty 2

## 2018-05-03 MED ORDER — CEFTRIAXONE SODIUM 1 G IJ SOLR
1.0000 g | INTRAMUSCULAR | Status: DC
  Administered 2018-05-04 – 2018-05-07 (×4): 1 g via INTRAVENOUS
  Filled 2018-05-03: qty 10
  Filled 2018-05-03 (×4): qty 1
  Filled 2018-05-03: qty 10

## 2018-05-03 MED ORDER — DOCUSATE SODIUM 100 MG PO CAPS
100.0000 mg | ORAL_CAPSULE | Freq: Two times a day (BID) | ORAL | Status: DC
  Administered 2018-05-03 – 2018-05-08 (×9): 100 mg via ORAL
  Filled 2018-05-03 (×9): qty 1

## 2018-05-03 MED ORDER — IPRATROPIUM-ALBUTEROL 0.5-2.5 (3) MG/3ML IN SOLN
3.0000 mL | Freq: Once | RESPIRATORY_TRACT | Status: AC
  Administered 2018-05-03: 3 mL via RESPIRATORY_TRACT
  Filled 2018-05-03: qty 3

## 2018-05-03 MED ORDER — INSULIN GLARGINE 100 UNIT/ML ~~LOC~~ SOLN
15.0000 [IU] | Freq: Every evening | SUBCUTANEOUS | Status: DC | PRN
  Filled 2018-05-03: qty 0.15

## 2018-05-03 MED ORDER — INSULIN ASPART 100 UNIT/ML ~~LOC~~ SOLN
0.0000 [IU] | Freq: Three times a day (TID) | SUBCUTANEOUS | Status: DC
  Administered 2018-05-03: 8 [IU] via SUBCUTANEOUS

## 2018-05-03 MED ORDER — PREGABALIN 50 MG PO CAPS
50.0000 mg | ORAL_CAPSULE | Freq: Two times a day (BID) | ORAL | Status: DC
  Administered 2018-05-03 – 2018-05-08 (×10): 50 mg via ORAL
  Filled 2018-05-03 (×10): qty 1

## 2018-05-03 MED ORDER — SODIUM CHLORIDE 0.9 % IV SOLN
INTRAVENOUS | Status: DC
  Administered 2018-05-03 – 2018-05-04 (×2): via INTRAVENOUS

## 2018-05-03 MED ORDER — TRAMADOL HCL 50 MG PO TABS
50.0000 mg | ORAL_TABLET | Freq: Four times a day (QID) | ORAL | Status: DC | PRN
  Administered 2018-05-04 – 2018-05-06 (×4): 50 mg via ORAL
  Filled 2018-05-03 (×4): qty 1

## 2018-05-03 MED ORDER — HYDROCOD POLST-CPM POLST ER 10-8 MG/5ML PO SUER
5.0000 mL | Freq: Two times a day (BID) | ORAL | Status: DC
  Administered 2018-05-03 – 2018-05-08 (×11): 5 mL via ORAL
  Filled 2018-05-03 (×11): qty 5

## 2018-05-03 MED ORDER — INSULIN GLARGINE 100 UNIT/ML ~~LOC~~ SOLN
15.0000 [IU] | Freq: Every day | SUBCUTANEOUS | Status: DC
Start: 2018-05-03 — End: 2018-05-04
  Administered 2018-05-03: 15 [IU] via SUBCUTANEOUS
  Filled 2018-05-03 (×2): qty 0.15

## 2018-05-03 MED ORDER — POTASSIUM CHLORIDE CRYS ER 20 MEQ PO TBCR
40.0000 meq | EXTENDED_RELEASE_TABLET | ORAL | Status: AC
  Administered 2018-05-03 (×2): 40 meq via ORAL
  Filled 2018-05-03 (×2): qty 2

## 2018-05-03 MED ORDER — SODIUM CHLORIDE 0.9 % IV SOLN
2.0000 g | Freq: Once | INTRAVENOUS | Status: AC
  Administered 2018-05-03: 2 g via INTRAVENOUS
  Filled 2018-05-03: qty 2

## 2018-05-03 MED ORDER — MAGNESIUM SULFATE 4 GM/100ML IV SOLN
4.0000 g | Freq: Once | INTRAVENOUS | Status: AC
  Administered 2018-05-03: 4 g via INTRAVENOUS
  Filled 2018-05-03: qty 100

## 2018-05-03 MED ORDER — SODIUM CHLORIDE 0.9 % IV SOLN
500.0000 mg | Freq: Once | INTRAVENOUS | Status: AC
  Administered 2018-05-03: 500 mg via INTRAVENOUS
  Filled 2018-05-03: qty 500

## 2018-05-03 MED ORDER — IPRATROPIUM-ALBUTEROL 0.5-2.5 (3) MG/3ML IN SOLN
3.0000 mL | RESPIRATORY_TRACT | Status: DC
  Administered 2018-05-03 – 2018-05-08 (×30): 3 mL via RESPIRATORY_TRACT
  Filled 2018-05-03 (×28): qty 3

## 2018-05-03 MED ORDER — BISACODYL 5 MG PO TBEC: 5.0000 mg | DELAYED_RELEASE_TABLET | Freq: Every day | ORAL | Status: DC | PRN

## 2018-05-03 MED ORDER — METHYLPREDNISOLONE SODIUM SUCC 125 MG IJ SOLR
125.0000 mg | Freq: Once | INTRAMUSCULAR | Status: AC
  Administered 2018-05-03: 125 mg via INTRAVENOUS
  Filled 2018-05-03: qty 2

## 2018-05-03 MED ORDER — TORSEMIDE 20 MG PO TABS
20.0000 mg | ORAL_TABLET | Freq: Every day | ORAL | Status: DC
  Administered 2018-05-04 – 2018-05-07 (×4): 20 mg via ORAL
  Filled 2018-05-03 (×4): qty 1

## 2018-05-03 MED ORDER — POTASSIUM CHLORIDE CRYS ER 20 MEQ PO TBCR
20.0000 meq | EXTENDED_RELEASE_TABLET | Freq: Every day | ORAL | Status: DC
  Administered 2018-05-04 – 2018-05-08 (×5): 20 meq via ORAL
  Filled 2018-05-03 (×5): qty 1

## 2018-05-03 MED ORDER — METHYLPREDNISOLONE SODIUM SUCC 125 MG IJ SOLR
60.0000 mg | INTRAMUSCULAR | Status: DC
  Administered 2018-05-03 – 2018-05-04 (×2): 60 mg via INTRAVENOUS
  Filled 2018-05-03 (×2): qty 2

## 2018-05-03 MED ORDER — BUDESONIDE 0.25 MG/2ML IN SUSP
0.2500 mg | Freq: Two times a day (BID) | RESPIRATORY_TRACT | Status: DC
Start: 2018-05-03 — End: 2018-05-08
  Administered 2018-05-03 – 2018-05-08 (×10): 0.25 mg via RESPIRATORY_TRACT
  Filled 2018-05-03 (×10): qty 2

## 2018-05-03 MED ORDER — TRAZODONE HCL 50 MG PO TABS: 25.0000 mg | ORAL_TABLET | Freq: Every evening | ORAL | Status: DC | PRN

## 2018-05-03 MED ORDER — ONDANSETRON HCL 4 MG/2ML IJ SOLN: 4.0000 mg | Freq: Four times a day (QID) | INTRAMUSCULAR | Status: DC | PRN

## 2018-05-03 MED ORDER — ONDANSETRON HCL 4 MG PO TABS: 4.0000 mg | ORAL_TABLET | Freq: Four times a day (QID) | ORAL | Status: DC | PRN

## 2018-05-03 MED ORDER — ALBUTEROL SULFATE (2.5 MG/3ML) 0.083% IN NEBU
5.0000 mg | INHALATION_SOLUTION | Freq: Once | RESPIRATORY_TRACT | Status: AC
  Administered 2018-05-03: 5 mg via RESPIRATORY_TRACT
  Filled 2018-05-03: qty 6

## 2018-05-03 MED ORDER — AZITHROMYCIN 500 MG IV SOLR
500.0000 mg | INTRAVENOUS | Status: DC
  Filled 2018-05-03: qty 500

## 2018-05-03 MED ORDER — ENOXAPARIN SODIUM 40 MG/0.4ML ~~LOC~~ SOLN
40.0000 mg | SUBCUTANEOUS | Status: DC
  Administered 2018-05-03 – 2018-05-07 (×5): 40 mg via SUBCUTANEOUS
  Filled 2018-05-03 (×5): qty 0.4

## 2018-05-03 MED ORDER — INSULIN ASPART 100 UNIT/ML ~~LOC~~ SOLN
0.0000 [IU] | Freq: Three times a day (TID) | SUBCUTANEOUS | Status: DC
  Administered 2018-05-03: 11 [IU] via SUBCUTANEOUS
  Administered 2018-05-04: 12 [IU] via SUBCUTANEOUS
  Administered 2018-05-04: 15 [IU] via SUBCUTANEOUS
  Administered 2018-05-04: 11 [IU] via SUBCUTANEOUS
  Administered 2018-05-05: 15 [IU] via SUBCUTANEOUS
  Administered 2018-05-05: 11 [IU] via SUBCUTANEOUS
  Administered 2018-05-05: 15 [IU] via SUBCUTANEOUS
  Administered 2018-05-05: 11 [IU] via SUBCUTANEOUS
  Administered 2018-05-06: 5 [IU] via SUBCUTANEOUS
  Administered 2018-05-06 (×2): 15 [IU] via SUBCUTANEOUS
  Administered 2018-05-06: 8 [IU] via SUBCUTANEOUS
  Administered 2018-05-07: 15 [IU] via SUBCUTANEOUS
  Administered 2018-05-07: 11 [IU] via SUBCUTANEOUS
  Administered 2018-05-07: 15 [IU] via SUBCUTANEOUS
  Filled 2018-05-03 (×16): qty 1

## 2018-05-03 MED ORDER — ACETAMINOPHEN 650 MG RE SUPP: 650.0000 mg | Freq: Four times a day (QID) | RECTAL | Status: DC | PRN

## 2018-05-03 NOTE — H&P (Signed)
[facility] [place] at [facility]   PATIENT NAME: Mary [name]    MR#:  [id number]  DATE OF BIRTH:  1976-11-24  DATE OF ADMISSION:  05/03/2018  PRIMARY CARE PHYSICIAN: Corwin Levins, MD   REQUESTING/REFERRING PHYSICIAN: Dr. Scotty Court  CHIEF COMPLAINT: Shortness of breath   Chief Complaint  Patient presents with  . Cough    HISTORY OF PRESENT ILLNESS:  Mary [name]  is a 42 y.o. female with a known history of respiratory failure, on 3 L oxygen came in because of worsening shortness of breath is improved with cough since yesterday.  No extremity edema.  No chest pain.  Started to have heartburn and then symptoms got worse with breathing, patient tried inhaler, called PCP who advised her to go to ER.  Patient found to have [place] pneumonia, received a series of nebulizer, steroids, antibiotics, and we are asked to admit the patient.  She still has lots of cough, able to talk full sentences but she is coughing in between.  Not in obvious respiratory distress.  Saturation on 3 L of oxygen is around 92%.  He told me that since yesterday she had to use 3 and half liters of oxygen. PAST MEDICAL HISTORY:   Past Medical History:  Diagnosis Date  . Asthma   . Axillary adenopathy    right axillary adenopathy noted on CT chest (03/06/2012)  . Chronic right-sided CHF (congestive heart failure) (HCC) 03/23/2012   with cor pulmonale. Last RHC 03/2012  . COPD (chronic obstructive pulmonary disease) (HCC)    PFTS 03/08/12: fev1 0.58L/1%, FVC 1.18/33%, Ratop 49 and c/w ssevere obstruction. 21% BD response on FVC, RV 219%, DLCO 11/54%  . DVT of upper extremity (deep vein thrombosis) [facility]) April 2013   right subclavian // Unclear precipitating cause - possibly significant right heart failure, with vascular stasis potentially predisposing to clotting.    . Exertional shortness of breath   . Hepatic cirrhosis (HCC)    Questionable history of - Noted on CT abdomen (03/2012)  - thought to be due to vascular congestion from right heart failure +/- patient's history of alcohol abuse  . History of alcoholism (HCC)   . History of chronic bronchitis   . Irregular menses   . Migraine    "~ 1/yr" (05/03/2013)  . Moderate to severe pulmonary hypertension [facility]) April 2013   Cardiac cath on 03/06/12 - 1. Elevated pulmonary artery pressures, right sided filling pressures.,  2. PA: 64/45 (mean 53)    . On home oxygen therapy    "2-3 L 24/7" (05/03/2013)  . OSA (obstructive sleep apnea)    wears noctural BiPAP (05/03/2013)  . Periodontal disease   . Pneumonia ~ 1985; 01/2011  . Pulmonary embolus [facility]) April 2013   Precipitating cause unclear. Was treated with coumadin from April-June 2013.  . Pulmonary hypertension (HCC)     PAST SURGICAL HISTOIRY:   Past Surgical History:  Procedure Laterality Date  . APPENDECTOMY  05/03/2013  . CARDIAC CATHETERIZATION  03/2012  . CARDIAC CATHETERIZATION N/A 01/08/2016   Procedure: Right Heart Cath;  Surgeon: Dolores Patty, MD;  Location: [facility] INVASIVE CV LAB;  Service: Cardiovascular;  Laterality: N/A;  . CARDIAC SURGERY  03/02/77   "my heart was backwards" (05/03/2013)  . LAPAROSCOPIC APPENDECTOMY N/A 05/03/2013   Procedure: APPENDECTOMY LAPAROSCOPIC;  Surgeon: Cherylynn Ridges, MD;  Location: [facility] OR;  Service: General;  Laterality: N/A;  . LUNG SURGERY  August 06, 1977  . RIGHT HEART CATHETERIZATION N/A  03/07/2012   Procedure: RIGHT HEART CATH;  Surgeon: Kathleene Hazel, MD;  Location: [facility] CATH LAB;  Service: Cardiovascular;  Laterality: N/A;    SOCIAL HISTORY:   Social History   Tobacco Use  . Smoking status: Current Some Day Smoker    Packs/day: 0.25    Years: 25.00    Pack years: 6.25    Types: Cigarettes    Last attempt to quit: 01/08/2018    Years since quitting: 0.3  . Smokeless tobacco: Never Used  . Tobacco comment: a couple drags of one cigarettes  Substance Use Topics  . Alcohol use: Not Currently    FAMILY  HISTORY:   Family History  Problem Relation Age of Onset  . Hypertension Father   . Heart disease Father        CHF; died age 21   . Alcohol abuse Father   . Other Brother        died age 23 y.o overdose    DRUG ALLERGIES:   Allergies  Allergen Reactions  . Bee Venom Swelling    Pt states she swells up 3x normal size. If stung "2-3 times, my throat could close up"    REVIEW OF SYSTEMS:  CONSTITUTIONAL: No fever, fatigue or weakness.  EYES: No blurred or double vision.  EARS, NOSE, AND THROAT: No tinnitus or ear pain.  RESPIRATORY: Cough, shortness of breath some wheezing. , cARDIOVASCULAR: No chest pain, orthopnea, edema.  GASTROINTESTINAL: No nausea, vomiting, diarrhea or abdominal pain.  GENITOURINARY: No dysuria, hematuria.  ENDOCRINE: No polyuria, nocturia,  HEMATOLOGY: No anemia, easy bruising or bleeding SKIN: No rash or lesion. MUSCULOSKELETAL: No joint pain or arthritis.   NEUROLOGIC: No tingling, numbness, weakness.  PSYCHIATRY: No anxiety or depression.   MEDICATIONS AT HOME:   Prior to Admission medications   Medication Sig Start Date End Date Taking? Authorizing Provider  budesonide-formoterol (SYMBICORT) 160-4.5 MCG/ACT inhaler Inhale 2 puffs into the lungs daily. 04/19/18  Yes Kalman Shan, MD  gabapentin (NEURONTIN) 300 MG capsule TAKE 2 CAPSULES (600 MG TOTAL) BY MOUTH 3 (THREE) TIMES DAILY. 04/17/18  Yes Corwin Levins, MD  insulin lispro (HUMALOG) 100 UNIT/ML injection Inject 0-0.15 mLs (0-15 Units total) into the skin as needed. If BS >200 use 8 units  CBG < 70: Eat or drink something right away and recheck. CBG 70 - 120: 0 units CBG 121 - 150: 2 units CBG 151 - 200: 3 units CBG 201 - 250: 5 units CBG 251 - 300: 8 units CBG 301 - 350: 11 units CBG 351 - 400: 15 units CBG > 400: call MD. 01/14/18  Yes Leatha Gilding, MD  pregabalin (LYRICA) 50 MG capsule Take 1 capsule (50 mg total) by mouth 2 (two) times daily. 10/31/17  Yes Plotnikov, Georgina Quint, MD  saccharomyces boulardii (FLORASTOR) 250 MG capsule Take 1 capsule (250 mg total) by mouth 2 (two) times daily. 02/10/18  Yes Albertine Grates, MD  SPIRIVA RESPIMAT 1.25 MCG/ACT AERS INHALE 2 PUFFS INTO THE LUNGS DAILY. 09/27/17  Yes Kalman Shan, MD  torsemide (DEMADEX) 20 MG tablet TAKE 1 TABLET BY MOUTH EVERY DAY 04/17/18  Yes Corwin Levins, MD  albuterol (VENTOLIN HFA) 108 (90 Base) MCG/ACT inhaler USE 2 PUFFS EVERY 6 HOURS AS NEEDED FOR WHEEZING 12/13/17   Kalman Shan, MD  diclofenac sodium (VOLTAREN) 1 % GEL Apply 2 g topically 3 (three) times daily. Patient not taking: Reported on 05/03/2018 09/28/17   Burna Cash, MD  guaiFENesin [facility])  600 MG 12 hr tablet Take 1 tablet (600 mg total) by mouth 2 (two) times daily. Patient not taking: Reported on 05/03/2018 02/11/17   Vassie Loll, MD  guaiFENesin-codeine 100-10 MG/5ML syrup Take 5 mLs by mouth every 4 (four) hours as needed for cough. Patient not taking: Reported on 05/03/2018 02/10/18   Albertine Grates, MD  Insulin Glargine [facility]) 100 UNIT/ML SOPN Inject 0.15 mLs (15 Units total) into the skin daily as needed (BS). 01/14/18   Gherghe, Daylene Katayama, MD  pantoprazole (PROTONIX) 40 MG tablet Take 1 tablet (40 mg total) by mouth 2 (two) times daily before a meal. Patient not taking: Reported on 05/03/2018 07/22/16   Jeralyn Bennett, MD  potassium chloride SA (K-DUR,KLOR-CON) 20 MEQ tablet Take 1 tablet (20 mEq total) by mouth daily. Patient not taking: Reported on 05/03/2018 01/29/16   Bensimhon, Bevelyn Buckles, MD  torsemide (DEMADEX) 20 MG tablet TAKE 1 TABLET BY MOUTH EVERY DAY Patient not taking: Reported on 05/03/2018 03/27/18   Corwin Levins, MD  traMADol (ULTRAM) 50 MG tablet TAKE 1 TABLET BY MOUTH EVERY 8 HOURS AS NEEDED FOR SEVERE PAIN 10/31/17   Plotnikov, Georgina Quint, MD      VITAL SIGNS:  Blood pressure 134/81, pulse 97, temperature 99.1 F (37.3 C), temperature source Oral, resp. rate 19, height 5' 2.5" (1.588 m), weight 88.5  kg (195 lb), last menstrual period 04/24/2018, SpO2 91 %.  PHYSICAL EXAMINATION:  GENERAL:  42 y.o.-year-old patient lying in the bed with no acute distress.  Noted to have constant cough.  Told me cough is mostly dry. EYES: Pupils equal, round, reactive to light and accommodation. No scleral icterus. Extraocular muscles intact.  HEENT: Head atraumatic, normocephalic. Oropharynx and nasopharynx clear.  NECK:  Supple, no jugular venous distention. No thyroid enlargement, no tenderness.  LUNGS: Poor air entry bilaterally.  CARDIOVASCULAR: S1, S2 normal. No murmurs, rubs, or gallops.  ABDOMEN: Soft, nontender, nondistended. Bowel sounds present. No organomegaly or mass.  EXTREMITIES: No pedal edema, cyanosis, or clubbing.  NEUROLOGIC: Cranial nerves II through XII are intact. Muscle strength 5/5 in all extremities. Sensation intact. Gait not checked.  PSYCHIATRIC: The patient is alert and oriented x 3.  SKIN: No obvious rash, lesion, or ulcer.   LABORATORY PANEL:   CBC Recent Labs  Lab 05/03/18 1022  WBC 7.8  HGB 13.8  HCT 40.2  PLT 122*   ------------------------------------------------------------------------------------------------------------------  Chemistries  Recent Labs  Lab 05/03/18 1022  NA 133*  K 3.2*  CL 92*  CO2 31  GLUCOSE 126*  BUN 7  CREATININE 0.41*  CALCIUM 8.7*   ------------------------------------------------------------------------------------------------------------------  Cardiac Enzymes No results for input(s): TROPONINI in the last 168 hours. ------------------------------------------------------------------------------------------------------------------  RADIOLOGY:  Dg Chest Portable 1 View  Result Date: 05/03/2018 CLINICAL DATA:  Patient reports a productive cough and burning chest sensation which began yesterday and has worsened. History of asthma-COPD, CHF, and pulmonary hypertension. EXAM: PORTABLE CHEST 1 VIEW COMPARISON:  Chest x-ray of  March 05/25/2018 and chest CT scan of the same day FINDINGS: The lungs are mildly hyperinflated. There are patchy airspace opacities obscuring the left cardiac apex. There is no pleural effusion. The heart is top-normal in size. The pulmonary vascularity is not engorged. The mediastinum is normal in width. The observed bony thorax is normal. IMPRESSION: Hyperinflation consistent with COPD-reactive airway disease. Atelectasis or pneumonia in the lingula. Followup PA and lateral chest X-ray is recommended in 3-4 weeks following trial of antibiotic therapy to ensure resolution and exclude  underlying malignancy. Electronically Signed   By: David  [place] M.D.   On: 05/03/2018 10:16    EKG:   Orders placed or performed during the hospital encounter of 02/08/18  . ED EKG  . ED EKG  . EKG 12-Lead  . EKG 12-Lead  . ED EKG  . ED EKG  . EKG 12-Lead  . EKG 12-Lead  . EKG    IMPRESSION AND PLAN:   42 year old female patient with chronic respiratory failure on 3 L of oxygen due to COPD, history of hypertension, steroid-induced hyperglycemia and diabetes, morbid obesity, she is on BiPAP at night comes in because of worsening shortness of breath, cough for 2 days. 1.  Acute on chronic respiratory failure secondary to COPD exacerbation, lingular pneumonia: Patient told me that she had to go up on oxygen to 3 and half liters since yesterday, she usually uses 2 L of oxygen.  Admit the patient to hospitalist service, continue oxygen, continue BiPAP at night, added IV steroids, IV antibiotics, bronchodilators with DuoNeb's, Pulmicort and see how she does. 2.  History of heavy smoking, no quit. 3.  Essential hypertension: Controlled, now has hypokalemia likely due to diuretics, replace potassium, continue torsemide that she takes already. 4.  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  Start back Lantus, add sliding scale with coverage. 5.  History of diabeticneuropathy,  patient is on gabapentin.    All the records are reviewed and case discussed with ED provider. Management plans discussed with the patient, family and they are in agreement.  CODE STATUS:full TOTAL TIME TAKING CARE OF THIS PATIENT: .    Katha Hamming M.D on 05/03/2018 at 1:59 PM  Between 7am to 6pm - Pager - [phone]  After 6pm go to [email or web address] - password [email or web address] [facility]  [place] [facility]  Office  [phone]  CC: Primary care physician; Corwin Levins, MD  Note: This dictation was prepared with Dragon dictation along with smaller phrase technology. Any transcriptional errors that result from this process are unintentional.

## 2018-05-03 NOTE — Progress Notes (Signed)
Talked to Dr. Anne Hahn about patient's blood sugar of 350, order for bedtime Novolog given. Patient on IV Solumedrol. RN will continue to monitor.

## 2018-05-03 NOTE — ED Notes (Signed)
This RN assisted pt to bathroom with portable O2

## 2018-05-03 NOTE — Telephone Encounter (Signed)
FYI

## 2018-05-03 NOTE — ED Triage Notes (Signed)
Pt c/o burning sensation with productive cough that began yesterday morning that progressively got worse throughout the day. PT reports green phlegm when she coughs. Pt c/o tightness in chest that began yesterday afternoon.  Pt reports 79% O2 while moving this morning. States when she sits down her sats rebounded to her normal. Pt took nebulizer and inhaler this morning with no relief.   Hx COPD, pulmonary hypertension, asthma, CHF and OSA.

## 2018-05-03 NOTE — ED Provider Notes (Addendum)
[facility] Emergency Department Provider Note  ____________________________________________  Time seen: Approximately 12:59 PM  I have reviewed the triage vital signs and the nursing notes.   HISTORY  Chief Complaint Cough    HPI Mary [name] is a 42 y.o. female with a history of COPD, CHF, cirrhosis, pulmonary embolism who complains of shortness of breath and productive cough for the past 2 days. She's been using her inhalers, but has been worsening. Aggravated by walking. She noticed it on her usual 3 L nasal cannula her oxygen saturation dropped to 79% at home while walking. Denies chest pain. No dizziness or syncope. No fevers or chills. Has a productive cough.      Past Medical History:  Diagnosis Date  . Asthma   . Axillary adenopathy    right axillary adenopathy noted on CT chest (03/06/2012)  . Chronic right-sided CHF (congestive heart failure) (HCC) 03/23/2012   with cor pulmonale. Last RHC 03/2012  . COPD (chronic obstructive pulmonary disease) (HCC)    PFTS 03/08/12: fev1 0.58L/1%, FVC 1.18/33%, Ratop 49 and c/w ssevere obstruction. 21% BD response on FVC, RV 219%, DLCO 11/54%  . DVT of upper extremity (deep vein thrombosis) [facility]) April 2013   right subclavian // Unclear precipitating cause - possibly significant right heart failure, with vascular stasis potentially predisposing to clotting.    . Exertional shortness of breath   . Hepatic cirrhosis (HCC)    Questionable history of - Noted on CT abdomen (03/2012) - thought to be due to vascular congestion from right heart failure +/- patient's history of alcohol abuse  . History of alcoholism (HCC)   . History of chronic bronchitis   . Irregular menses   . Migraine    "~ 1/yr" (05/03/2013)  . Moderate to severe pulmonary hypertension [facility]) April 2013   Cardiac cath on 03/06/12 - 1. Elevated pulmonary artery pressures, right sided filling pressures.,  2. PA: 64/45 (mean 53)    . On home oxygen  therapy    "2-3 L 24/7" (05/03/2013)  . OSA (obstructive sleep apnea)    wears noctural BiPAP (05/03/2013)  . Periodontal disease   . Pneumonia ~ 1985; 01/2011  . Pulmonary embolus [facility]) April 2013   Precipitating cause unclear. Was treated with coumadin from April-June 2013.  . Pulmonary hypertension [facility])      Patient Active Problem List   Diagnosis Date Noted  . COPD with exacerbation (HCC) 05/03/2018  . Prolonged QT interval 02/08/2018  . COPD with acute exacerbation (HCC) 01/11/2018  . Left flank pain 11/01/2017  . Positive D dimer   . Leukocytosis 04/29/2017  . Acute exacerbation of chronic obstructive pulmonary disease (COPD) (HCC) 04/24/2017  . 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 Left ankle sprain 04/21/2017  . Fungemia 02/13/2017  . COPD (chronic obstructive pulmonary disease) (HCC) 02/13/2017  . Chronic respiratory failure with hypoxia and hypercapnia (HCC) 02/13/2017  . Alcohol abuse 01/31/2017  . Acute on chronic respiratory failure with hypoxia and hypercapnia (HCC)   . Tracheomalacia   . Palliative care by specialist   . Anxiety state   . Advance directive declined by patient   . Goals of care, counseling/discussion   . Chronic hoarseness 07/27/2016  . Diabetic neuropathy (HCC) 04/30/2016  . Diabetes mellitus type 2 in obese (HCC) 04/10/2016  . Chronic diastolic heart failure (HCC) 01/25/2016  . PAH (pulmonary artery hypertension) (HCC)   . Constipation 11/18/2015  . Morbid obesity (HCC) 11/18/2015  . Lung nodule 01/30/2015  .  Pulmonary mass 01/13/2015  . Gastroparesis   . Esophageal reflux   . Obstipation   . Hypokalemia 02/28/2013  . Financial difficulties 02/28/2013  . Obesity hypoventilation syndrome (HCC) 02/28/2013  . Myalgia 02/28/2013  . Anovulation 06/30/2012  . Preventative health care 05/05/2012  . Irregular menstrual cycle 04/27/2012  . Chronic right-sided CHF (congestive heart failure) (HCC) 03/23/2012  . Pulmonary hypertension  (HCC) 03/23/2012  . Cor pulmonale (HCC) 03/21/2012  . OSA on BiPAP 03/19/2012  . Axillary lymphadenopathy 03/06/2012  . Congenital heart disease 01/28/2012  . Dental caries 01/28/2012     Past Surgical History:  Procedure Laterality Date  . APPENDECTOMY  05/03/2013  . CARDIAC CATHETERIZATION  03/2012  . CARDIAC CATHETERIZATION N/A 01/08/2016   Procedure: Right Heart Cath;  Surgeon: Dolores Patty, MD;  Location: [facility] INVASIVE CV LAB;  Service: Cardiovascular;  Laterality: N/A;  . CARDIAC SURGERY  Dec 24, 1976   "my heart was backwards" (05/03/2013)  . LAPAROSCOPIC APPENDECTOMY N/A 05/03/2013   Procedure: APPENDECTOMY LAPAROSCOPIC;  Surgeon: Cherylynn Ridges, MD;  Location: [facility] OR;  Service: General;  Laterality: N/A;  . LUNG SURGERY  17-May-1977  . RIGHT HEART CATHETERIZATION N/A 03/07/2012   Procedure: RIGHT HEART CATH;  Surgeon: Kathleene Hazel, MD;  Location: [facility] CATH LAB;  Service: Cardiovascular;  Laterality: N/A;     Prior to Admission medications   Medication Sig Start Date End Date Taking? Authorizing Provider  budesonide-formoterol (SYMBICORT) 160-4.5 MCG/ACT inhaler Inhale 2 puffs into the lungs daily. 04/19/18  Yes Kalman Shan, MD  gabapentin (NEURONTIN) 300 MG capsule TAKE 2 CAPSULES (600 MG TOTAL) BY MOUTH 3 (THREE) TIMES DAILY. 04/17/18  Yes Corwin Levins, MD  insulin lispro (HUMALOG) 100 UNIT/ML injection Inject 0-0.15 mLs (0-15 Units total) into the skin as needed. If BS >200 use 8 units  CBG < 70: Eat or drink something right away and recheck. CBG 70 - 120: 0 units CBG 121 - 150: 2 units CBG 151 - 200: 3 units CBG 201 - 250: 5 units CBG 251 - 300: 8 units CBG 301 - 350: 11 units CBG 351 - 400: 15 units CBG > 400: call MD. 01/14/18  Yes Leatha Gilding, MD  pregabalin (LYRICA) 50 MG capsule Take 1 capsule (50 mg total) by mouth 2 (two) times daily. 10/31/17  Yes Plotnikov, Georgina Quint, MD  saccharomyces boulardii (FLORASTOR) 250 MG capsule Take 1 capsule (250 mg  total) by mouth 2 (two) times daily. 02/10/18  Yes Albertine Grates, MD  SPIRIVA RESPIMAT 1.25 MCG/ACT AERS INHALE 2 PUFFS INTO THE LUNGS DAILY. 09/27/17  Yes Kalman Shan, MD  torsemide (DEMADEX) 20 MG tablet TAKE 1 TABLET BY MOUTH EVERY DAY 04/17/18  Yes Corwin Levins, MD  albuterol (VENTOLIN HFA) 108 (90 Base) MCG/ACT inhaler USE 2 PUFFS EVERY 6 HOURS AS NEEDED FOR WHEEZING 12/13/17   Kalman Shan, MD  diclofenac sodium (VOLTAREN) 1 % GEL Apply 2 g topically 3 (three) times daily. Patient not taking: Reported on 05/03/2018 09/28/17   Burna Cash, MD  guaiFENesin (MUCINEX) 600 MG 12 hr tablet Take 1 tablet (600 mg total) by mouth 2 (two) times daily. Patient not taking: Reported on 05/03/2018 02/11/17   Vassie Loll, MD  guaiFENesin-codeine 100-10 MG/5ML syrup Take 5 mLs by mouth every 4 (four) hours as needed for cough. Patient not taking: Reported on 05/03/2018 02/10/18   Albertine Grates, MD  Insulin Glargine [facility] KWIKPEN) 100 UNIT/ML SOPN Inject 0.15 mLs (15 Units total) into the skin daily  as needed (BS). 01/14/18   Leatha Gilding, MD  pantoprazole (PROTONIX) 40 MG tablet Take 1 tablet (40 mg total) by mouth 2 (two) times daily before a meal. Patient not taking: Reported on 05/03/2018 07/22/16   Jeralyn Bennett, MD  potassium chloride SA (K-DUR,KLOR-CON) 20 MEQ tablet Take 1 tablet (20 mEq total) by mouth daily. Patient not taking: Reported on 05/03/2018 01/29/16   Bensimhon, Bevelyn Buckles, MD  torsemide (DEMADEX) 20 MG tablet TAKE 1 TABLET BY MOUTH EVERY DAY Patient not taking: Reported on 05/03/2018 03/27/18   Corwin Levins, MD  traMADol (ULTRAM) 50 MG tablet TAKE 1 TABLET BY MOUTH EVERY 8 HOURS AS NEEDED FOR SEVERE PAIN 10/31/17   Plotnikov, Georgina Quint, MD     Allergies Bee venom   Family History  Problem Relation Age of Onset  . Hypertension Father   . Heart disease Father        CHF; died age 33   . Alcohol abuse Father   . Other Brother        died age 77 y.o overdose    Social  History Social History   Tobacco Use  . Smoking status: Current Some Day Smoker    Packs/day: 0.25    Years: 25.00    Pack years: 6.25    Types: Cigarettes    Last attempt to quit: 01/08/2018    Years since quitting: 0.3  . Smokeless tobacco: Never Used  . Tobacco comment: a couple drags of one cigarettes  Substance Use Topics  . Alcohol use: Not Currently  . Drug use: No    Review of Systems  Constitutional:   No fever or chills.  ENT:   No sore throat. No rhinorrhea. Cardiovascular:   No chest pain or syncope. Respiratory:   positive shortness of breath and productive cough. Gastrointestinal:   Negative for abdominal pain, vomiting and diarrhea.  Musculoskeletal:   Negative for focal pain or swelling All other systems reviewed and are negative except as documented above in ROS and HPI.  ____________________________________________   PHYSICAL EXAM:  VITAL SIGNS: ED Triage Vitals  Enc Vitals Group     BP 05/03/18 0932 127/71     Pulse Rate 05/03/18 0932 (!) 104     Resp 05/03/18 0932 20     Temp 05/03/18 0932 99.1 F (37.3 C)     Temp Source 05/03/18 0932 Oral     SpO2 05/03/18 0932 95 %     Weight 05/03/18 0934 195 lb (88.5 kg)     Height 05/03/18 0934 5' 2.5" (1.588 m)     Head Circumference --      Peak Flow --      Pain Score 05/03/18 0934 9     Pain Loc --      Pain Edu? --      Excl. in GC? --     Vital signs reviewed, nursing assessments reviewed.   Constitutional:   Alert and oriented. not in distress. Eyes:   Conjunctivae are normal. EOMI. PERRL. ENT      Head:   Normocephalic and atraumatic.      Nose:   No congestion/rhinnorhea.       Mouth/Throat:   MMM, no pharyngeal erythema. No peritonsillar mass.       Neck:   No meningismus. Full ROM. Hematological/Lymphatic/Immunilogical:   No cervical lymphadenopathy. Cardiovascular:   tachycardia heart rate 110. Symmetric bilateral radial and DP pulses.  No murmurs.  Respiratory:   tachypnea,  increased  work of breathing. Diffuse expiratory wheezing. No focal crackles. Symmetric air entry in all lung fields.. Gastrointestinal:   Soft and nontender. Non distended. There is no CVA tenderness.  No rebound, rigidity, or guarding.  Musculoskeletal:   Normal range of motion in all extremities. No joint effusions.  No lower extremity tenderness.  No edema. Neurologic:   Normal speech and language.  Motor grossly intact. No acute focal neurologic deficits are appreciated.  Skin:    Skin is warm, dry and intact. No rash noted.  No petechiae, purpura, or bullae.  ____________________________________________    LABS (pertinent positives/negatives) (all labs ordered are listed, but only abnormal results are displayed) Labs Reviewed  BASIC METABOLIC PANEL - Abnormal; Notable for the following components:      Result Value   Sodium 133 (*)    Potassium 3.2 (*)    Chloride 92 (*)    Glucose, Bld 126 (*)    Creatinine, Ser 0.41 (*)    Calcium 8.7 (*)    All other components within normal limits  CBC WITH DIFFERENTIAL/PLATELET - Abnormal; Notable for the following components:   MCV 100.1 (*)    MCH 34.3 (*)    Platelets 122 (*)    Lymphs Abs 0.9 (*)    All other components within normal limits  CBC  CREATININE, SERUM   ____________________________________________   EKG    ____________________________________________    RADIOLOGY  Dg Chest Portable 1 View  Result Date: 05/03/2018 CLINICAL DATA:  Patient reports a productive cough and burning chest sensation which began yesterday and has worsened. History of asthma-COPD, CHF, and pulmonary hypertension. EXAM: PORTABLE CHEST 1 VIEW COMPARISON:  Chest x-ray of March 05/25/2018 and chest CT scan of the same day FINDINGS: The lungs are mildly hyperinflated. There are patchy airspace opacities obscuring the left cardiac apex. There is no pleural effusion. The heart is top-normal in size. The pulmonary vascularity is not engorged.  The mediastinum is normal in width. The observed bony thorax is normal. IMPRESSION: Hyperinflation consistent with COPD-reactive airway disease. Atelectasis or pneumonia in the lingula. Followup PA and lateral chest X-ray is recommended in 3-4 weeks following trial of antibiotic therapy to ensure resolution and exclude underlying malignancy. Electronically Signed   By: David  [place] M.D.   On: 05/03/2018 10:16    ____________________________________________   PROCEDURES .Critical Care Performed by: Sharman Cheek, MD Authorized by: Sharman Cheek, MD   Critical care provider statement:    Critical care time (minutes):  30   Critical care time was exclusive of:  Separately billable procedures and treating other patients   Critical care was necessary to treat or prevent imminent or life-threatening deterioration of the following conditions:  Respiratory failure   Critical care was time spent personally by me on the following activities:  Development of treatment plan with patient or surrogate, discussions with consultants, evaluation of patient's response to treatment, examination of patient, obtaining history from patient or surrogate, ordering and performing treatments and interventions, ordering and review of laboratory studies, ordering and review of radiographic studies, pulse oximetry, re-evaluation of patient's condition and review of old charts    ____________________________________________  DIFFERENTIAL DIAGNOSIS   COPD exacerbation, pneumonia, pneumothorax, less likely pulmonary embolism  CLINICAL IMPRESSION / ASSESSMENT AND PLAN / ED COURSE  Pertinent labs & imaging results that were available during my care of the patient were reviewed by me and considered in my medical decision making (see chart for details).    patient not in distress but  with respiratory complaints. Presentation highly consistent with COPD exacerbation. Patient given nebs and Solu-Medrol, and on  reassessment at 1:30 PM she is feeling improved but still has diffuse wheezing. Still has hypoxia to 85% with ambulation in the treatment room. Chest x-ray shows lingular opacity suggestive of early pneumonia.  ----------------------------------------- 2:00 PM on 05/03/2018 -----------------------------------------  Ordered ceftriaxone and azithromycin, case discussed with hospitalist for further management.      ____________________________________________   FINAL CLINICAL IMPRESSION(S) / ED DIAGNOSES    Final diagnoses:  Acute on chronic respiratory failure with hypoxia (HCC)  Community acquired pneumonia of left lung, unspecified part of lung     ED Discharge Orders    None      Portions of this note were generated with dragon dictation software. Dictation errors may occur despite best attempts at proofreading.    Sharman Cheek, MD 05/03/18 1401    Sharman Cheek, MD 05/10/18 [phone]

## 2018-05-03 NOTE — Progress Notes (Signed)
Family Meeting Note  Advance Directive:yes  Today a meeting took place with the Patient.  Discussed CODE STATUS with patient, patient has multiple medical problems of chronic respiratory failure, COPD, on BiPAP at night, morbid obesity, diabetes.  She says she is too young, wants to be full code.  The following clinical team members were present during this meeting:MD  The following were discussed:Patient's diagnosis: , Patient's progosis: Guarded  Additional follow-up to be provided: Follow  Time spent during discussion:20 minutes  Katha Hamming, MD

## 2018-05-03 NOTE — Progress Notes (Signed)
Talked to Dr. Luberta Mutter, about patient's states that she takes Lyrica 50 mg BID, tramadol PRN for pain and also uses Bipap at bedtime at home, order given. No other concern at the moment. RN will continue to monitor.

## 2018-05-03 NOTE — Telephone Encounter (Signed)
I assume this means that pt called EMS and went to ED

## 2018-05-03 NOTE — Telephone Encounter (Signed)
Pt called with complaints of ? Bronchitis; she has burning in her chest, productive cough with green phlegm; she reports her oxygen level is bouncing from 91 (her baseline) and 87; feels like she "is breathing soup"; the pt states that she turned her oxygen up to 3.5 liters last night (normally uses 2-3 liters); nurse triage initiated and recommendations made per protocol to include call EMS; the pt verbalizes understanding and states that she will call as soon as she has room mate puts the dog away, and she can get dressed; will notify office of this encounter and pt disposition.  Reason for Disposition . Severe difficulty breathing (e.g., struggling for each breath, speaks in single words)  Answer Assessment - Initial Assessment Questions 1. RESPIRATORY STATUS: "Describe your breathing?" (e.g., wheezing, shortness of breath, unable to speak, severe coughing)      Shortness of breath at times; severe cough 2. ONSET: "When did this breathing problem begin?"      05/02/18 3. PATTERN "Does the difficult breathing come and go, or has it been constant since it started?"      Constant cough 4. SEVERITY: "How bad is your breathing?" (e.g., mild, moderate, severe)    - MILD: No SOB at rest, mild SOB with walking, speaks normally in sentences, can lay down, no retractions, pulse < 100.    - MODERATE: SOB at rest, SOB with minimal exertion and prefers to sit, cannot lie down flat, speaks in phrases, mild retractions, audible wheezing, pulse 100-120.    - SEVERE: Very SOB at rest, speaks in single words, struggling to breathe, sitting hunched forward, retractions, pulse > 120      moderae 5. RECURRENT SYMPTOM: "Have you had difficulty breathing before?" If so, ask: "When was the last time?" and "What happened that time?"      Yes; sees Pulmonologist 6. CARDIAC HISTORY: "Do you have any history of heart disease?" (e.g., heart attack, angina, bypass surgery, angioplasty)     Congestive heart failure 7. LUNG  HISTORY: "Do you have any history of lung disease?"  (e.g., pulmonary embolus, asthma, emphysema)     COPD, pulmonary hypertension, sleep apnea 8. CAUSE: "What do you think is causing the breathing problem?"      ? bronchitis 9. OTHER SYMPTOMS: "Do you have any other symptoms? (e.g., dizziness, runny nose, cough, chest pain, fever)     Runny nose, severe cough, chest burning 10. PREGNANCY: "Is there any chance you are pregnant?" "When was your last menstrual period?"       No 04/24/18 11. TRAVEL: "Have you traveled out of the country in the last month?" (e.g., travel history, exposures)       no  Protocols used: BREATHING DIFFICULTY-A-AH

## 2018-05-03 NOTE — Progress Notes (Signed)
Bipap programmed to pt's home settings per pt. (20/10)

## 2018-05-04 ENCOUNTER — Encounter: Payer: Self-pay | Admitting: *Deleted

## 2018-05-04 LAB — BASIC METABOLIC PANEL
ANION GAP: 11 (ref 5–15)
BUN: 7 mg/dL (ref 6–20)
CALCIUM: 8.6 mg/dL — AB (ref 8.9–10.3)
CO2: 28 mmol/L (ref 22–32)
Chloride: 92 mmol/L — ABNORMAL LOW (ref 101–111)
Creatinine, Ser: 0.47 mg/dL (ref 0.44–1.00)
GFR calc Af Amer: >60 mL/min (ref >60)
Glucose, Bld: 301 mg/dL — ABNORMAL HIGH (ref 65–99)
Potassium: 4.4 mmol/L (ref 3.5–5.1)
Sodium: 131 mmol/L — ABNORMAL LOW (ref 135–145)

## 2018-05-04 LAB — CBC
HCT: 40.2 % (ref 35.0–47.0)
Hemoglobin: 13.9 g/dL (ref 12.0–16.0)
MCH: 35 pg — AB (ref 26.0–34.0)
MCHC: 34.5 g/dL (ref 32.0–36.0)
MCV: 101.5 fL — ABNORMAL HIGH (ref 80.0–100.0)
Platelets: 102 10*3/uL — ABNORMAL LOW (ref 150–440)
RBC: 3.97 MIL/uL (ref 3.80–5.20)
RDW: 13.6 % (ref 11.5–14.5)
WBC: 6.6 10*3/uL (ref 3.6–11.0)

## 2018-05-04 LAB — GLUCOSE, CAPILLARY
Glucose-Capillary: 306 mg/dL — ABNORMAL HIGH (ref 65–99)
Glucose-Capillary: 473 mg/dL — ABNORMAL HIGH (ref 65–99)
Glucose-Capillary: 498 mg/dL — ABNORMAL HIGH (ref 65–99)
Glucose-Capillary: 413 mg/dL — ABNORMAL HIGH (ref 65–99)

## 2018-05-04 LAB — MAGNESIUM: MAGNESIUM: 2.3 mg/dL (ref 1.7–2.4)

## 2018-05-04 MED ORDER — INSULIN ASPART 100 UNIT/ML ~~LOC~~ SOLN
18.0000 [IU] | Freq: Once | SUBCUTANEOUS | Status: AC
Start: 2018-05-04 — End: 2018-05-04
  Administered 2018-05-04: 18 [IU] via SUBCUTANEOUS
  Filled 2018-05-04: qty 1

## 2018-05-04 MED ORDER — AZITHROMYCIN 250 MG PO TABS
250.0000 mg | ORAL_TABLET | Freq: Every day | ORAL | Status: AC
  Administered 2018-05-04 – 2018-05-07 (×4): 250 mg via ORAL
  Filled 2018-05-04 (×4): qty 1

## 2018-05-04 MED ORDER — INSULIN GLARGINE 100 UNIT/ML ~~LOC~~ SOLN
19.0000 [IU] | Freq: Every day | SUBCUTANEOUS | Status: DC
  Administered 2018-05-04: 19 [IU] via SUBCUTANEOUS
  Filled 2018-05-04 (×2): qty 0.19

## 2018-05-04 MED ORDER — IPRATROPIUM-ALBUTEROL 0.5-2.5 (3) MG/3ML IN SOLN
RESPIRATORY_TRACT | Status: AC
  Filled 2018-05-04: qty 3

## 2018-05-04 NOTE — Progress Notes (Signed)
MD notified. Pts CBG is 473. Orders to give 12 units. I will continue to assess.

## 2018-05-04 NOTE — Progress Notes (Signed)
[facility] - [place] at [facility]   PATIENT NAME: Mary [name]    MR#:  [id number]  DATE OF BIRTH:  Dec 07, 1975  SUBJECTIVE: Patient admitted for pneumonia, COPD exacerbation,/  CHIEF COMPLAINT:   Chief Complaint  Patient presents with  . Cough   She says she is feeling little better than yesterday. REVIEW OF SYSTEMS:   ROS CONSTITUTIONAL: No fever, fatigue or weakness.  EYES: No blurred or double vision.  EARS, NOSE, AND THROAT: No tinnitus or ear pain.  RESPIRATORY: Cough, shortness of breath.  CARDIOVASCULAR: No chest pain, orthopnea, edema.  GASTROINTESTINAL: No nausea, vomiting, diarrhea or abdominal pain.  GENITOURINARY: No dysuria, hematuria.  ENDOCRINE: No polyuria, nocturia,  HEMATOLOGY: No anemia, easy bruising or bleeding SKIN: No rash or lesion. MUSCULOSKELETAL: No joint pain or arthritis.   NEUROLOGIC: No tingling, numbness, weakness.  PSYCHIATRY: No anxiety or depression.   DRUG ALLERGIES:   Allergies  Allergen Reactions  . Bee Venom Swelling    Pt states she swells up 3x normal size. If stung "2-3 times, my throat could close up"    VITALS:  Blood pressure 111/70, pulse 73, temperature 97.9 F (36.6 C), temperature source Oral, resp. rate 19, height  (1.575 m), weight 91.6 kg (202 lb), last menstrual period 04/24/2018, SpO2 96 %.  PHYSICAL EXAMINATION:  GENERAL:  42 y.o.-year-old patient lying in the bed with no acute distress.  EYES: Pupils equal, round, reactive to light and accommodation. No scleral icterus. Extraocular muscles intact.  HEENT: Head atraumatic, normocephalic. Oropharynx and nasopharynx clear.  NECK:  Supple, no jugular venous distention. No thyroid enlargement, no tenderness.  LUNGS:  faint expiratory wheeze bilaterally. CARDIOVASCULAR: S1, S2 normal. No murmurs, rubs, or gallops.  ABDOMEN: Soft, nontender, nondistended. Bowel sounds present. No organomegaly or mass.  EXTREMITIES: No pedal edema,  cyanosis, or clubbing.  NEUROLOGIC: Cranial nerves II through XII are intact. Muscle strength 5/5 in all extremities. Sensation intact. Gait not checked.  PSYCHIATRIC: The patient is alert and oriented x 3.  SKIN: No obvious rash, lesion, or ulcer.    LABORATORY PANEL:   CBC Recent Labs  Lab 05/04/18 0517  WBC 6.6  HGB 13.9  HCT 40.2  PLT 102*   ------------------------------------------------------------------------------------------------------------------  Chemistries  Recent Labs  Lab 05/04/18 0517  NA 131*  K 4.4  CL 92*  CO2 28  GLUCOSE 301*  BUN 7  CREATININE 0.47  CALCIUM 8.6*  MG 2.3   ------------------------------------------------------------------------------------------------------------------  Cardiac Enzymes No results for input(s): TROPONINI in the last 168 hours. ------------------------------------------------------------------------------------------------------------------  RADIOLOGY:  Dg Chest Portable 1 View  Result Date: 05/03/2018 CLINICAL DATA:  Patient reports a productive cough and burning chest sensation which began yesterday and has worsened. History of asthma-COPD, CHF, and pulmonary hypertension. EXAM: PORTABLE CHEST 1 VIEW COMPARISON:  Chest x-ray of March 05/25/2018 and chest CT scan of the same day FINDINGS: The lungs are mildly hyperinflated. There are patchy airspace opacities obscuring the left cardiac apex. There is no pleural effusion. The heart is top-normal in size. The pulmonary vascularity is not engorged. The mediastinum is normal in width. The observed bony thorax is normal. IMPRESSION: Hyperinflation consistent with COPD-reactive airway disease. Atelectasis or pneumonia in the lingula. Followup PA and lateral chest X-ray is recommended in 3-4 weeks following trial of antibiotic therapy to ensure resolution and exclude underlying malignancy. Electronically Signed   By: David  [place] M.D.   On: 05/03/2018 10:16    EKG:    Orders placed or  performed during the hospital encounter of 05/03/18  . EKG 12-Lead  . EKG 12-Lead    ASSESSMENT AND PLAN:  42 year old  female patient with chronic respiratory failure on 3 L of oxygen admitted for pneumonia, COPD exacerbation 1.  Acute on chronic respiratory failure secondary to lingular pneumonia, COPD exacerbation.  She is feeling better than yesterday, continue bronchodilators, IV steroids, antibiotics, continue bronchodilators but give her rest at night so she can sleep without getting disturbed for bronchodilator treatment, use nebulizer at night as needed. 2.  Diabetes mellitus type 2 ; uncontrolled secondary to steroids, continue sliding scale insulin with coverage, increase Lantus dose. #3 chronic diastolic heart failure: Patient is on torsemide 20 mg daily, continue this.    All the records are reviewed and case discussed with Care Management/Social Workerr. Management plans discussed with the patient, family and they are in agreement.  CODE STATUS: full  TOTAL TIME TAKING CARE OF THIS PATIENT: 38 minutes.   POSSIBLE D/C IN 1-2DAYS, DEPENDING ON CLINICAL CONDITION.   Katha Hamming M.D on 05/04/2018 at 1:12 PM  Between 7am to 6pm - Pager - [phone]  After 6pm go to [email or web address] - password [email or web address] [facility]  [facility]  Office  [phone]  CC: Primary care physician; Corwin Levins, MD   Note: This dictation was prepared with Dragon dictation along with smaller phrase technology. Any transcriptional errors that result from this process are unintentional.

## 2018-05-04 NOTE — Progress Notes (Signed)
Inpatient Diabetes Program Recommendations  AACE/ADA: New Consensus Statement on Inpatient Glycemic Control (2015)  Target Ranges:  Prepandial:   less than 140 mg/dL      Peak postprandial:   less than 180 mg/dL (1-2 hours)      Critically ill patients:  140 - 180 mg/dL   Results for MANESSA, BULEY (MRN [id number]) as of 05/04/2018 09:55  Ref. Range 05/03/2018 16:59 05/03/2018 21:19 05/04/2018 07:59  Glucose-Capillary Latest Ref Range: 65 - 99 mg/dL 865 (H)  8 units NOVOLOG  350 (H)  11 units NOVOLOG +  15 units LANTUS 306 (H)  11 units NOVOLOG    Results for AREEN, TRAUTNER (MRN [id number]) as of 05/04/2018 09:55  Ref. Range 02/24/2018 16:35  Hemoglobin A1C Latest Ref Range: 4.6 - 6.5 % 6.7 (H)    Home DM Meds: Basaglar 15 units daily        Humalog SSI  Current Orders: Lantus 15 units QHS       Novolog Moderate Correction Scale/ SSI (0-15 units) TID AC + HS       Note Pt got 125 mg Solumedrol X 1 dose yest at 10am and now getting Solumedrol 60 mg daily.   Novolog and Lantus both started last PM.  Last A1c showed good glucose control at home.  CBGs elevated in hospital setting likely due to steroids.     MD- Please consider the following in-hospital insulin adjustments:  Start Novolog Meal Coverage: Novolog 4 units TID with meals (hold if pt eats <50% of meal)  (Use Glycemic Control Order set)       --Will follow patient during hospitalization--  Ambrose Finland RN, MSN, CDE Diabetes Coordinator Inpatient Glycemic Control Team Team Pager: [phone] (8a-5p)

## 2018-05-04 NOTE — Progress Notes (Signed)
Md notified. Pts CBG is 498. MD orders 18units. I will continue to assess.

## 2018-05-05 LAB — GLUCOSE, CAPILLARY
GLUCOSE-CAPILLARY: 494 mg/dL — AB (ref 65–99)
GLUCOSE-CAPILLARY: 399 mg/dL — AB (ref 65–99)
Glucose-Capillary: 335 mg/dL — ABNORMAL HIGH (ref 65–99)
Glucose-Capillary: 325 mg/dL — ABNORMAL HIGH (ref 65–99)

## 2018-05-05 MED ORDER — GUAIFENESIN-DM 100-10 MG/5ML PO SYRP
5.0000 mL | ORAL_SOLUTION | ORAL | Status: DC | PRN
  Administered 2018-05-05: 5 mL via ORAL
  Filled 2018-05-05: qty 5

## 2018-05-05 MED ORDER — INSULIN ASPART 100 UNIT/ML ~~LOC~~ SOLN
6.0000 [IU] | Freq: Three times a day (TID) | SUBCUTANEOUS | Status: DC
  Administered 2018-05-05 – 2018-05-06 (×4): 6 [IU] via SUBCUTANEOUS
  Filled 2018-05-05 (×4): qty 1

## 2018-05-05 MED ORDER — METHYLPREDNISOLONE SODIUM SUCC 40 MG IJ SOLR
40.0000 mg | INTRAMUSCULAR | Status: DC
  Administered 2018-05-06 – 2018-05-07 (×2): 40 mg via INTRAVENOUS
  Filled 2018-05-05 (×2): qty 1

## 2018-05-05 MED ORDER — INSULIN GLARGINE 100 UNIT/ML ~~LOC~~ SOLN
22.0000 [IU] | Freq: Every day | SUBCUTANEOUS | Status: DC
  Administered 2018-05-05 – 2018-05-06 (×2): 22 [IU] via SUBCUTANEOUS
  Filled 2018-05-05 (×3): qty 0.22

## 2018-05-05 NOTE — Progress Notes (Signed)
Inpatient Diabetes Program Recommendations  AACE/ADA: New Consensus Statement on Inpatient Glycemic Control (2015)  Target Ranges:  Prepandial:   less than 140 mg/dL      Peak postprandial:   less than 180 mg/dL (1-2 hours)      Critically ill patients:  140 - 180 mg/dL   Results for CHANTEE, CERINO (MRN [id number]) as of 05/05/2018 10:53  Ref. Range 05/04/2018 07:59 05/04/2018 11:50 05/04/2018 16:50 05/04/2018 21:03  Glucose-Capillary Latest Ref Range: 65 - 99 mg/dL 829 (H)  11 units NOVOLOG  473 (H)  12 units NOVOLOG  498 (H)  18 units NOVOLOG  413 (H)  15 units NOVOLOG +  19 units LANTUS   Results for DENESHA, BROUSE (MRN [id number]) as of 05/05/2018 10:53  Ref. Range 05/05/2018 07:57  Glucose-Capillary Latest Ref Range: 65 - 99 mg/dL 784 (H)  11 units NOVOLOG     Home DM Meds: Basaglar 15 units daily                              Humalog SSI  Current Orders: Lantus 19 units QHS                             Novolog Moderate Correction Scale/ SSI (0-15 units) TID AC + HS     Note Solumedrol reduced to 40 mg daily today.  Also note Lantus increased to 19 units QHS last PM.    MD- Please also consider starting Novolog Meal Coverage: Novolog 6 units TID with meals (hold if pt eats <50% of meal)     --Will follow patient during hospitalization--  Ambrose Finland RN, MSN, CDE Diabetes Coordinator Inpatient Glycemic Control Team Team Pager: [phone] (8a-5p)

## 2018-05-05 NOTE — Care Management (Signed)
Third admission in 6 months for exac copd (Feb and march 2019). The two previous were at [facility]  she has chronic home 02 and a home nebulizer.

## 2018-05-05 NOTE — Progress Notes (Signed)
[facility] [place] [facility]   PATIENT NAME: Mary PardonMichelle [name]    MR#:  [id number]  DATE OF BIRTH:  04/01/1976  SUBJECTIVE: Patient admitted for pneumonia, COPD exacerbation,/  CHIEF COMPLAINT:   Chief Complaint  Patient presents with  . Cough   Has cough, wheezing.  Better but still wheezing and cough still bothering her. REVIEW OF SYSTEMS:   ROS CONSTITUTIONAL: No fever, fatigue or weakness.  EYES: No blurred or double vision.  EARS, NOSE, AND THROAT: No tinnitus or ear pain.  RESPIRATORY: Cough, shortness of breath.  CARDIOVASCULAR: No chest pain, orthopnea, edema.  GASTROINTESTINAL: No nausea, vomiting, diarrhea or abdominal pain.  GENITOURINARY: No dysuria, hematuria.  ENDOCRINE: No polyuria, nocturia,  HEMATOLOGY: No anemia, easy bruising or bleeding SKIN: No rash or lesion. MUSCULOSKELETAL: No joint pain or arthritis.   NEUROLOGIC: No tingling, numbness, weakness.  PSYCHIATRY: No anxiety or depression.   DRUG ALLERGIES:   Allergies  Allergen Reactions  . Bee Venom Swelling    Pt states she swells up 3x normal size. If stung "2-3 times, my throat could close up"    VITALS:  Blood pressure 114/75, pulse (!) 55, temperature 97.8 F (36.6 C), temperature source Oral, resp. rate 18, height 5\' 2"  (1.575 m), weight 93.4 kg (206 lb), last menstrual period 04/24/2018, SpO2 98 %.  PHYSICAL EXAMINATION:  GENERAL:  42 y.o.-year-old patient lying in the bed with no acute distress.  EYES: Pupils equal, round, reactive to light and accommodation. No scleral icterus. Extraocular muscles intact.  HEENT: Head atraumatic, normocephalic. Oropharynx and nasopharynx clear.  NECK:  Supple, no jugular venous distention. No thyroid enlargement, no tenderness.  LUNGS:  faint expiratory wheeze bilaterally. CARDIOVASCULAR: S1, S2 normal. No murmurs, rubs, or gallops.  ABDOMEN: Soft, nontender, nondistended. Bowel sounds present. No organomegaly or mass.   EXTREMITIES: No pedal edema, cyanosis, or clubbing.  NEUROLOGIC: Cranial nerves II through XII are intact. Muscle strength 5/5 in all extremities. Sensation intact. Gait not checked.  PSYCHIATRIC: The patient is alert and oriented x 3.  SKIN: No obvious rash, lesion, or ulcer.    LABORATORY PANEL:   CBC Recent Labs  Lab 05/04/18 0517  WBC 6.6  HGB 13.9  HCT 40.2  PLT 102*   ------------------------------------------------------------------------------------------------------------------  Chemistries  Recent Labs  Lab 05/04/18 0517  NA 131*  K 4.4  CL 92*  CO2 28  GLUCOSE 301*  BUN 7  CREATININE 0.47  CALCIUM 8.6*  MG 2.3   ------------------------------------------------------------------------------------------------------------------  Cardiac Enzymes No results for input(s): TROPONINI in the last 168 hours. ------------------------------------------------------------------------------------------------------------------  RADIOLOGY:  No results found.  EKG:   Orders placed or performed during the hospital encounter of 05/03/18  . EKG 12-Lead  . EKG 12-Lead    ASSESSMENT AND PLAN:  42 year old  female patient with chronic respiratory failure on 3 L of oxygen admitted for pneumonia, COPD exacerbation 1.  Acute on chronic respiratory failure secondary to lingular pneumonia, COPD exacerbation.  She is feeling better than yesterday, continue bronchodilators, decrease the dose of IV steroids, continue antibiotics, bronchodilators.    possible discharge tomorrow. 2.  Diabetes mellitus type 2 ; uncontrolled secondary to steroids, continue sliding scale insulin with coverage, increase Lantus dose further.  Add mealtime coverage NovoLog 6 units 3 times daily with meals as per diabetic coordinator recommendation. #3. chronic diastolic heart failure: Patient is on torsemide 20 mg daily, continue this.    All the records are reviewed and case discussed with Care  Management/Social  Workerr. Management plans discussed with the patient, family and they are in agreement.  CODE STATUS: full  TOTAL TIME TAKING CARE OF THIS PATIENT: 38 minutes.   POSSIBLE D/C IN 1-2DAYS, DEPENDING ON CLINICAL CONDITION.   Katha Hamming M.D on 05/05/2018 at 10:57 AM  Between 7am to 6pm - Pager - [phone]  After 6pm go to [email or web address] - password [email or web address] [facility]  [place] [facility]  Office  [phone]  CC: Primary care physician; Corwin Levins, MD   Note: This dictation was prepared with Dragon dictation along with smaller phrase technology. Any transcriptional errors that result from this process are unintentional.

## 2018-05-05 NOTE — Progress Notes (Signed)
CRITICAL VALUE ALERT  Critical Value:  CBG 494  Date & Time Notied:  05/05/18.1155  Provider Notified: Dr. Luberta Mutter  Orders Received/Actions taken:  Give 21 units of novolog.will continue to monitor.

## 2018-05-05 NOTE — Plan of Care (Signed)
  Problem: Clinical Measurements: Goal: Will remain free from infection Outcome: Progressing Goal: Diagnostic test results will improve Outcome: Progressing   Problem: Activity: Goal: Risk for activity intolerance will decrease Outcome: Progressing   Problem: Coping: Goal: Level of anxiety will decrease Outcome: Progressing   Problem: Elimination: Goal: Will not experience complications related to urinary retention Outcome: Progressing   Problem: Pain Managment: Goal: General experience of comfort will improve Outcome: Progressing   Problem: Safety: Goal: Ability to remain free from injury will improve Outcome: Progressing   Problem: Skin Integrity: Goal: Risk for impaired skin integrity will decrease Outcome: Progressing   Problem: Activity: Goal: Ability to tolerate increased activity will improve Outcome: Progressing Goal: Will verbalize the importance of balancing activity with adequate rest periods Outcome: Progressing   Problem: Respiratory: Goal: Ability to maintain a clear airway will improve Outcome: Progressing Goal: Levels of oxygenation will improve Outcome: Progressing Goal: Ability to maintain adequate ventilation will improve Outcome: Progressing

## 2018-05-05 NOTE — Progress Notes (Signed)
Pt still not ready to go on the bipap ,

## 2018-05-05 NOTE — Plan of Care (Signed)
  Problem: Clinical Measurements: Goal: Will remain free from infection Outcome: Progressing Goal: Diagnostic test results will improve Outcome: Progressing   Problem: Activity: Goal: Risk for activity intolerance will decrease Outcome: Progressing   Problem: Pain Managment: Goal: General experience of comfort will improve Outcome: Progressing   

## 2018-05-05 NOTE — Progress Notes (Signed)
Decreased to 3l 

## 2018-05-06 LAB — GLUCOSE, CAPILLARY
GLUCOSE-CAPILLARY: 239 mg/dL — AB (ref 65–99)
GLUCOSE-CAPILLARY: 409 mg/dL — AB (ref 65–99)
GLUCOSE-CAPILLARY: 354 mg/dL — AB (ref 65–99)
Glucose-Capillary: 351 mg/dL — ABNORMAL HIGH (ref 65–99)
Glucose-Capillary: 465 mg/dL — ABNORMAL HIGH (ref 65–99)

## 2018-05-06 MED ORDER — GUAIFENESIN ER 600 MG PO TB12
600.0000 mg | ORAL_TABLET | Freq: Two times a day (BID) | ORAL | Status: DC
  Administered 2018-05-06 – 2018-05-08 (×4): 600 mg via ORAL
  Filled 2018-05-06 (×4): qty 1

## 2018-05-06 MED ORDER — INSULIN ASPART 100 UNIT/ML ~~LOC~~ SOLN
8.0000 [IU] | Freq: Three times a day (TID) | SUBCUTANEOUS | Status: DC
  Administered 2018-05-06 – 2018-05-07 (×2): 8 [IU] via SUBCUTANEOUS
  Filled 2018-05-06 (×2): qty 1

## 2018-05-06 NOTE — Progress Notes (Signed)
Pt  Stated she is on and off biap

## 2018-05-06 NOTE — Progress Notes (Signed)
[name] - [place] at [facility]   PATIENT NAME: Mary [name]    MR#:  [id number]  DATE OF BIRTH:  07-23-1976  SUBJECTIVE: Patient admitted for pneumonia, COPD exacerbation,/more cough, able to get the phlegm out.  CHIEF COMPLAINT:   Chief Complaint  Patient presents with  . Cough   More cough, able to bring the phlegm.Marland Kitchen REVIEW OF SYSTEMS:   ROS CONSTITUTIONAL: No fever, fatigue or weakness.  EYES: No blurred or double vision.  EARS, NOSE, AND THROAT: No tinnitus or ear pain.  RESPIRATORY: Cough, shortness of breath.  CARDIOVASCULAR: No chest pain, orthopnea, edema.  GASTROINTESTINAL: No nausea, vomiting, diarrhea or abdominal pain.  GENITOURINARY: No dysuria, hematuria.  ENDOCRINE: No polyuria, nocturia,  HEMATOLOGY: No anemia, easy bruising or bleeding SKIN: No rash or lesion. MUSCULOSKELETAL: No joint pain or arthritis.   NEUROLOGIC: No tingling, numbness, weakness.  PSYCHIATRY: No anxiety or depression.   DRUG ALLERGIES:   Allergies  Allergen Reactions  . Bee Venom Swelling    Pt states she swells up 3x normal size. If stung "2-3 times, my throat could close up"    VITALS:  Blood pressure 116/80, pulse 79, temperature 98 F (36.7 C), resp. rate 16, height 5\' 2"  (1.575 m), weight 92.7 kg (204 lb 6.4 oz), last menstrual period 04/24/2018, SpO2 97 %.  PHYSICAL EXAMINATION:  GENERAL:  42 y.o.-year-old patient lying in the bed with no acute distress.  EYES: Pupils equal, round, reactive to light and accommodation. No scleral icterus. Extraocular muscles intact.  HEENT: Head atraumatic, normocephalic. Oropharynx and nasopharynx clear.  NECK:  Supple, no jugular venous distention. No thyroid enlargement, no tenderness.  LUNGS:  faint expiratory wheeze bilaterally. CARDIOVASCULAR: S1, S2 normal. No murmurs, rubs, or gallops.  ABDOMEN: Soft, nontender, nondistended. Bowel sounds present. No organomegaly or mass.  EXTREMITIES: No pedal edema,  cyanosis, or clubbing.  NEUROLOGIC: Cranial nerves II through XII are intact. Muscle strength 5/5 in all extremities. Sensation intact. Gait not checked.  PSYCHIATRIC: The patient is alert and oriented x 3.  SKIN: No obvious rash, lesion, or ulcer.    LABORATORY PANEL:   CBC Recent Labs  Lab 05/04/18 0517  WBC 6.6  HGB 13.9  HCT 40.2  PLT 102*   ------------------------------------------------------------------------------------------------------------------  Chemistries  Recent Labs  Lab 05/04/18 0517  NA 131*  K 4.4  CL 92*  CO2 28  GLUCOSE 301*  BUN 7  CREATININE 0.47  CALCIUM 8.6*  MG 2.3   ------------------------------------------------------------------------------------------------------------------  Cardiac Enzymes No results for input(s): TROPONINI in the last 168 hours. ------------------------------------------------------------------------------------------------------------------  RADIOLOGY:  No results found.  EKG:   Orders placed or performed during the hospital encounter of 05/03/18  . EKG 12-Lead  . EKG 12-Lead    ASSESSMENT AND PLAN:  42 year old  female patient with chronic respiratory failure on 3 L of oxygen admitted for pneumonia, COPD exacerbation 1.  Acute on chronic respiratory failure secondary to lingular pneumonia, COPD exacerbation.  She is feeling better than yesterday, continue bronchodilators, decrease the dose of IV steroids, continue antibiotics, bronchodilators.   Add Mucinex, likely discharge tomorrow. 2.  Diabetes mellitus type 2 ; uncontrolled secondary to steroids, continue sliding scale insulin with coverage, increase Lantus dose further.  inCrease mealtime coverage NovoLog 8 units 3 times daily with meals as per diabetic coordinator recommendation. #3. chronic diastolic heart failure: Patient is on torsemide 20 mg daily, continue this. Likely discharge tomorrow.   All the records are reviewed and case discussed with  Care Management/Social Workerr. Management plans discussed with the patient, family and they are in agreement.  CODE STATUS: full  TOTAL TIME TAKING CARE OF THIS PATIENT: 38 minutes.   POSSIBLE D/C IN 1-2DAYS, DEPENDING ON CLINICAL CONDITION.   Katha HammingSnehalatha Nadie Fiumara M.D on 05/06/2018 at 1:18 PM  Between 7am to 6pm - Pager - [phone]  After 6pm go to [email or web address] - password [email or web address]  [facility]  Office  [phone]  CC: Primary care physician; Corwin LevinsJohn, [name], MD   Note: This dictation was prepared with Dragon dictation along with smaller phrase technology. Any transcriptional errors that result from this process are unintentional.

## 2018-05-07 LAB — GLUCOSE, CAPILLARY
GLUCOSE-CAPILLARY: 321 mg/dL — AB (ref 65–99)
GLUCOSE-CAPILLARY: 501 mg/dL — AB (ref 65–99)
Glucose-Capillary: 391 mg/dL — ABNORMAL HIGH (ref 65–99)
Glucose-Capillary: 376 mg/dL — ABNORMAL HIGH (ref 65–99)

## 2018-05-07 MED ORDER — INSULIN GLARGINE 100 UNIT/ML ~~LOC~~ SOLN
24.0000 [IU] | Freq: Every day | SUBCUTANEOUS | Status: DC
  Administered 2018-05-07: 24 [IU] via SUBCUTANEOUS
  Filled 2018-05-07 (×2): qty 0.24

## 2018-05-07 MED ORDER — FUROSEMIDE 10 MG/ML IJ SOLN
INTRAMUSCULAR | Status: AC
  Filled 2018-05-07: qty 4

## 2018-05-07 MED ORDER — INSULIN ASPART 100 UNIT/ML ~~LOC~~ SOLN
12.0000 [IU] | Freq: Three times a day (TID) | SUBCUTANEOUS | Status: DC
  Administered 2018-05-07 – 2018-05-08 (×2): 12 [IU] via SUBCUTANEOUS
  Filled 2018-05-07 (×2): qty 1

## 2018-05-07 MED ORDER — INSULIN NPH (HUMAN) (ISOPHANE) 100 UNIT/ML ~~LOC~~ SUSP: 20.0000 [IU] | Freq: Once | SUBCUTANEOUS | Status: DC

## 2018-05-07 MED ORDER — INSULIN ASPART 100 UNIT/ML ~~LOC~~ SOLN
20.0000 [IU] | Freq: Once | SUBCUTANEOUS | Status: AC
  Administered 2018-05-07: 20 [IU] via SUBCUTANEOUS

## 2018-05-07 MED ORDER — ALUM & MAG HYDROXIDE-SIMETH 200-200-20 MG/5ML PO SUSP
30.0000 mL | Freq: Four times a day (QID) | ORAL | Status: DC | PRN
  Administered 2018-05-07 – 2018-05-08 (×2): 30 mL via ORAL
  Filled 2018-05-07: qty 30

## 2018-05-07 MED ORDER — METHYLPREDNISOLONE SODIUM SUCC 125 MG IJ SOLR
60.0000 mg | Freq: Two times a day (BID) | INTRAMUSCULAR | Status: DC
  Administered 2018-05-07: 60 mg via INTRAVENOUS
  Filled 2018-05-07 (×2): qty 2

## 2018-05-07 MED ORDER — FUROSEMIDE 10 MG/ML IJ SOLN: 40.0000 mg | Freq: Two times a day (BID) | INTRAMUSCULAR | Status: DC

## 2018-05-07 MED ORDER — FUROSEMIDE 10 MG/ML IJ SOLN
40.0000 mg | Freq: Every day | INTRAMUSCULAR | Status: DC
  Administered 2018-05-08: 40 mg via INTRAVENOUS
  Filled 2018-05-07: qty 4

## 2018-05-07 NOTE — Progress Notes (Signed)
[facility] - [place] at [facility]   PATIENT NAME: Mary PardonMichelle Hoagland    MR#:  [id number]  DATE OF BIRTH:  01/10/1976  Still wheezing, has cough, green phlegm  CHIEF COMPLAINT:   Chief Complaint  Patient presents with  . Cough   More cough, able to bring the phlegm.Marland Kitchen. REVIEW OF SYSTEMS:   ROS CONSTITUTIONAL: No fever, fatigue or weakness.  EYES: No blurred or double vision.  EARS, NOSE, AND THROAT: No tinnitus or ear pain.  RESPIRATORY: Cough, shortness of breath.  CARDIOVASCULAR: No chest pain, orthopnea, edema.  GASTROINTESTINAL: No nausea, vomiting, diarrhea or abdominal pain.  GENITOURINARY: No dysuria, hematuria.  ENDOCRINE: No polyuria, nocturia,  HEMATOLOGY: No anemia, easy bruising or bleeding SKIN: No rash or lesion. MUSCULOSKELETAL: No joint pain or arthritis.   NEUROLOGIC: No tingling, numbness, weakness.  PSYCHIATRY: No anxiety or depression.   DRUG ALLERGIES:   Allergies  Allergen Reactions  . Bee Venom Swelling    Pt states she swells up 3x normal size. If stung "2-3 times, my throat could close up"    VITALS:  Blood pressure (!) 117/58, pulse (!) 103, temperature 98 F (36.7 C), temperature source Oral, resp. rate 20, height 5\' 2"  (1.575 m), weight 92.7 kg (204 lb 4.8 oz), last menstrual period 04/24/2018, SpO2 95 %.  PHYSICAL EXAMINATION:  GENERAL:  42 y.o.-year-old patient lying in the bed with no acute distress.  EYES: Pupils equal, round, reactive to light and accommodation. No scleral icterus. Extraocular muscles intact.  HEENT: Head atraumatic, normocephalic. Oropharynx and nasopharynx clear.  NECK:  Supple, no jugular venous distention. No thyroid enlargement, no tenderness.  LUNGS:   expiratory wheeze bilaterally more today.Marland Kitchen. CARDIOVASCULAR: S1, S2 normal. No murmurs, rubs, or gallops.  ABDOMEN: Soft, nontender, nondistended. Bowel sounds present. No organomegaly or mass.  EXTREMITIES: No pedal edema, cyanosis, or  clubbing.  NEUROLOGIC: Cranial nerves II through XII are intact. Muscle strength 5/5 in all extremities. Sensation intact. Gait not checked.  PSYCHIATRIC: The patient is alert and oriented x 3.  SKIN: No obvious rash, lesion, or ulcer.    LABORATORY PANEL:   CBC Recent Labs  Lab 05/04/18 0517  WBC 6.6  HGB 13.9  HCT 40.2  PLT 102*   ------------------------------------------------------------------------------------------------------------------  Chemistries  Recent Labs  Lab 05/04/18 0517  NA 131*  K 4.4  CL 92*  CO2 28  GLUCOSE 301*  BUN 7  CREATININE 0.47  CALCIUM 8.6*  MG 2.3   ------------------------------------------------------------------------------------------------------------------  Cardiac Enzymes No results for input(s): TROPONINI in the last 168 hours. ------------------------------------------------------------------------------------------------------------------  RADIOLOGY:  No results found.  EKG:   Orders placed or performed during the hospital encounter of 05/03/18  . EKG 12-Lead  . EKG 12-Lead    ASSESSMENT AND PLAN:  42 year old  female patient with chronic respiratory failure on 3 L of oxygen admitted for pneumonia, COPD exacerbation 1.  Acute on chronic respiratory failure secondary to lingular pneumonia, COPD exacerbation.  Add Mucinex, likely discharge tomorrow.  Increase the dose of steroids because of more wheezing, cough  2.  Diabetes mellitus type 2 ; uncontrolled secondary to steroids, continue sliding scale insulin with coverage, increase Lantus dose further.  inCrease mealtime coverage NovoLog 12 units 3 times daily with meals as per diabetic coordinator recommendation. #3. chronic diastolic heart failure: IV Lasix, DC torsemide and see how she does. 4.  Unable to discharge today because previously wheezing, has cough, increase the dose of steroids, started IV Lasix to help her with  heart failure.  All the records are  reviewed and case discussed with Care Management/Social Workerr. Management plans discussed with the patient, family and they are in agreement.  CODE STATUS: full  TOTAL TIME TAKING CARE OF THIS PATIENT: 38 minutes.   POSSIBLE D/C IN 1-2DAYS, DEPENDING ON CLINICAL CONDITION.   Katha Hamming M.D on 05/07/2018 at 12:24 PM  Between 7am to 6pm - Pager - [phone]  After 6pm go to [email or web address] - password [email or web address] [facility]  [place] [facility]  Office  [phone]  CC: Primary care physician; Corwin Levins, MD   Note: This dictation was prepared with Dragon dictation along with smaller phrase technology. Any transcriptional errors that result from this process are unintentional.

## 2018-05-08 LAB — GLUCOSE, CAPILLARY
Glucose-Capillary: 516 mg/dL (ref 65–99)
Glucose-Capillary: 448 mg/dL — ABNORMAL HIGH (ref 65–99)
Glucose-Capillary: 407 mg/dL — ABNORMAL HIGH (ref 65–99)

## 2018-05-08 LAB — BASIC METABOLIC PANEL
ANION GAP: 10 (ref 5–15)
BUN: 17 mg/dL (ref 6–20)
CALCIUM: 9.1 mg/dL (ref 8.9–10.3)
CO2: 32 mmol/L (ref 22–32)
Chloride: 88 mmol/L — ABNORMAL LOW (ref 101–111)
Creatinine, Ser: 0.59 mg/dL (ref 0.44–1.00)
GFR calc Af Amer: >60 mL/min (ref >60)
GLUCOSE: 411 mg/dL — AB (ref 65–99)
Potassium: 4.8 mmol/L (ref 3.5–5.1)
Sodium: 130 mmol/L — ABNORMAL LOW (ref 135–145)

## 2018-05-08 MED ORDER — INSULIN ASPART 100 UNIT/ML ~~LOC~~ SOLN: 0.0000 [IU] | Freq: Three times a day (TID) | SUBCUTANEOUS | 11 refills | Status: DC

## 2018-05-08 MED ORDER — INSULIN ASPART 100 UNIT/ML ~~LOC~~ SOLN
18.0000 [IU] | Freq: Once | SUBCUTANEOUS | Status: AC
  Administered 2018-05-08: 18 [IU] via SUBCUTANEOUS

## 2018-05-08 MED ORDER — PREDNISONE 10 MG (21) PO TBPK: ORAL_TABLET | ORAL | 0 refills | Status: DC

## 2018-05-08 MED ORDER — METHYLPREDNISOLONE SODIUM SUCC 125 MG IJ SOLR
60.0000 mg | Freq: Every day | INTRAMUSCULAR | Status: DC
  Administered 2018-05-08: 60 mg via INTRAVENOUS

## 2018-05-08 MED ORDER — INSULIN ASPART 100 UNIT/ML ~~LOC~~ SOLN: 12.0000 [IU] | Freq: Three times a day (TID) | SUBCUTANEOUS | 11 refills | Status: DC

## 2018-05-08 MED ORDER — HYDROCOD POLST-CPM POLST ER 10-8 MG/5ML PO SUER: 5.0000 mL | Freq: Two times a day (BID) | ORAL | 0 refills | Status: DC

## 2018-05-08 MED ORDER — INSULIN ASPART 100 UNIT/ML ~~LOC~~ SOLN
16.0000 [IU] | Freq: Once | SUBCUTANEOUS | Status: AC
  Administered 2018-05-08: 16 [IU] via SUBCUTANEOUS
  Filled 2018-05-08: qty 1

## 2018-05-08 MED ORDER — INSULIN ASPART 100 UNIT/ML ~~LOC~~ SOLN
15.0000 [IU] | Freq: Three times a day (TID) | SUBCUTANEOUS | Status: DC
  Administered 2018-05-08: 15 [IU] via SUBCUTANEOUS
  Filled 2018-05-08: qty 1

## 2018-05-08 MED ORDER — PREDNISONE 50 MG PO TABS
50.0000 mg | ORAL_TABLET | Freq: Every day | ORAL | Status: DC
  Administered 2018-05-08: 50 mg via ORAL
  Filled 2018-05-08: qty 1

## 2018-05-08 MED ORDER — FUROSEMIDE 40 MG PO TABS
40.0000 mg | ORAL_TABLET | Freq: Once | ORAL | Status: AC
  Administered 2018-05-08: 40 mg via ORAL
  Filled 2018-05-08: qty 1

## 2018-05-08 MED ORDER — AMOXICILLIN-POT CLAVULANATE 875-125 MG PO TABS: 1.0000 | ORAL_TABLET | Freq: Two times a day (BID) | ORAL | 0 refills | Status: AC

## 2018-05-08 MED ORDER — INSULIN ASPART 100 UNIT/ML ~~LOC~~ SOLN: 15.0000 [IU] | Freq: Three times a day (TID) | SUBCUTANEOUS | 11 refills | Status: DC

## 2018-05-08 MED ORDER — BASAGLAR KWIKPEN 100 UNIT/ML ~~LOC~~ SOPN: 25.0000 [IU] | PEN_INJECTOR | Freq: Every day | SUBCUTANEOUS | 1 refills | Status: DC

## 2018-05-08 MED ORDER — INSULIN ASPART 100 UNIT/ML ~~LOC~~ SOLN
SUBCUTANEOUS | Status: AC
  Filled 2018-05-08: qty 1

## 2018-05-08 NOTE — Progress Notes (Signed)
Likely discharge later today when husband can pick up. Patient sugars have been high because of high-dose steroid, I adjusted the Lantus, mealtime insulin.

## 2018-05-08 NOTE — Progress Notes (Signed)
Discharged to home with husband and daughter.  Says she is out of Protonix.  Spoke with Dr. Luberta MutterKonidena and then called the prescription in for her.

## 2018-05-09 ENCOUNTER — Other Ambulatory Visit: Payer: Self-pay | Admitting: Internal Medicine

## 2018-05-09 DIAGNOSIS — G4733 Obstructive sleep apnea (adult) (pediatric): Secondary | ICD-10-CM | POA: Diagnosis not present

## 2018-05-09 DIAGNOSIS — J449 Chronic obstructive pulmonary disease, unspecified: Secondary | ICD-10-CM | POA: Diagnosis not present

## 2018-05-09 DIAGNOSIS — I270 Primary pulmonary hypertension: Secondary | ICD-10-CM | POA: Diagnosis not present

## 2018-05-09 NOTE — Telephone Encounter (Signed)
Copied from CRM [id number]. Topic: Quick Communication - Rx Refill/Question >> May 09, 2018 12:50 PM Mickel BaasMcGee, Manali Mcelmurry B, NT wrote: Medication: traMADol (ULTRAM) 50 MG tablet  Has the patient contacted their pharmacy? Yes.   (Agent: If no, request that the patient contact the pharmacy for the refill.) (Agent: If yes, when and what did the pharmacy advise?)  Preferred Pharmacy (with phone number or street name): [id number] - [place], [place] - [address]  Agent: Please be advised that RX refills may take up to 3 business days. We ask that you follow-up with your pharmacy.

## 2018-05-10 MED ORDER — TRAMADOL HCL 50 MG PO TABS: ORAL_TABLET | ORAL | 3 refills | Status: DC

## 2018-05-10 NOTE — Telephone Encounter (Signed)
Done erx 

## 2018-05-10 NOTE — Telephone Encounter (Signed)
LOV 02-24-18 with Dr. Jonny RuizJohn / Refill request for tramadol / Last filled:  Disp Refills Start End   traMADol (ULTRAM) 50 MG tablet 30 tablet 3 10/31/2017    Sig: TAKE 1 TABLET BY MOUTH EVERY 8 HOURS AS NEEDED FOR SEVERE PAIN

## 2018-05-10 NOTE — Discharge Summary (Signed)
Mary [name], is a 42 y.o. female  DOB 17-Dec-1975  MRN [id number].  Admission date:  05/03/2018  Admitting Physician  Katha Hamming, MD  Discharge Date:  05/08/2018   Primary MD  Corwin Levins, MD  Recommendations for primary care physician for things to follow:  Follow-up with PCP in 1 week   Admission Diagnosis  Acute on chronic respiratory failure with hypoxia (HCC) [J96.21] Community acquired pneumonia of left lung, unspecified part of lung [J18.9]   Discharge Diagnosis  Acute on chronic respiratory failure with hypoxia (HCC) [J96.21] Community acquired pneumonia of left lung, unspecified part of lung [J18.9]   Active Problems:   COPD with exacerbation (HCC)      Past Medical History:  Diagnosis Date  . Asthma   . Axillary adenopathy    right axillary adenopathy noted on CT chest (03/06/2012)  . Chronic right-sided CHF (congestive heart failure) (HCC) 03/23/2012   with cor pulmonale. Last RHC 03/2012  . COPD (chronic obstructive pulmonary disease) (HCC)    PFTS 03/08/12: fev1 0.58L/1%, FVC 1.18/33%, Ratop 49 and c/w ssevere obstruction. 21% BD response on FVC, RV 219%, DLCO 11/54%  . DVT of upper extremity (deep vein thrombosis) [facility]) April 2013   right subclavian // Unclear precipitating cause - possibly significant right heart failure, with vascular stasis potentially predisposing to clotting.    . Exertional shortness of breath   . Hepatic cirrhosis (HCC)    Questionable history of - Noted on CT abdomen (03/2012) - thought to be due to vascular congestion from right heart failure +/- patient's history of alcohol abuse  . History of alcoholism (HCC)   . History of chronic bronchitis   . Irregular menses   . Migraine    "~ 1/yr" (05/03/2013)  . Moderate to severe pulmonary hypertension [facility]) April 2013    Cardiac cath on 03/06/12 - 1. Elevated pulmonary artery pressures, right sided filling pressures.,  2. PA: 64/45 (mean 53)    . On home oxygen therapy    "2-3 L 24/7" (05/03/2013)  . OSA (obstructive sleep apnea)    wears noctural BiPAP (05/03/2013)  . Periodontal disease   . Pneumonia ~ 1985; 01/2011  . Pulmonary embolus [facility]) April 2013   Precipitating cause unclear. Was treated with coumadin from April-June 2013.  . Pulmonary hypertension (HCC)     Past Surgical History:  Procedure Laterality Date  . APPENDECTOMY  05/03/2013  . CARDIAC CATHETERIZATION  03/2012  . CARDIAC CATHETERIZATION N/A 01/08/2016   Procedure: Right Heart Cath;  Surgeon: Dolores Patty, MD;  Location: [facility] INVASIVE CV LAB;  Service: Cardiovascular;  Laterality: N/A;  . CARDIAC SURGERY  27-Jul-1977   "my heart was backwards" (05/03/2013)  . LAPAROSCOPIC APPENDECTOMY N/A 05/03/2013   Procedure: APPENDECTOMY LAPAROSCOPIC;  Surgeon: Cherylynn Ridges, MD;  Location: [facility] OR;  Service: General;  Laterality: N/A;  . LUNG SURGERY  November 09, 1977  . RIGHT HEART CATHETERIZATION N/A 03/07/2012   Procedure: RIGHT HEART CATH;  Surgeon: Kathleene Hazel, MD;  Location: [facility] CATH LAB;  Service: Cardiovascular;  Laterality: N/A;       History of present illness and  Hospital Course:     Kindly see H&P for history of present illness and admission details, please review complete Labs, Consult reports and Test reports for all details in brief  HPI  from the history and physical done on the day of admission  42 year old female patient with history of chronic respiratory failure on oxygen 2 L at  home and also on CPAP at night comes in because of worsening shortness of breath, cough, wheezing and chest tightness and admitted for COPD exacerbation,  pneumonia, acute on chronic respiratory failure secondary to COPD exacerbation and pneumonia.Marland Kitchen  Hospital Course   Acute on chronic respiratory failure due to COPD exacerbation,  community-acquired pneumonia: Chest x-ray showed lingular pneumonia.  Patient received IV Rocephin, Zithromax, patient had a lot of wheezing when she came decreased over time but still has slight wheezing when she is discharged but she says she feels much better and wants to go home.  Discharged with Augmentin for 14 days, prednisone dose taper, patient can continue her albuterol nebulizer, Spiriva. 2.  Diabetes mellitus type 2: Patient had uncontrolled diabetes mellitus with hyperglycemia secondary to steroid use, required Lantus, NovoLog with mealtime coverage, patient has history of high sugars with steroids.  So we will give prescription for Basalgar  insulin, NovoLog. 3.  Cough.  Secondary to her COPD: Patient takes Robitussin-DM at home, she says it does not help so started on Tussionex here here patient requested prescription for Tussionex.  I told her she cannot take Tussionex and Robitussin-DM. #4 chronic diastolic heart failure, patient is on torsemide at home.  Required IV Lasix in the hospital to help her with breathing but did not have CHF exacerbation. #5 history of sleep apnea: Continue CPAP at night. History of diabetic neuropathy: Patient is on Lyrica.  Follow-up with her PCP regarding her chronic pain medicines, neuropathy and recent prescriptions. Discharged home in stable condition.   Discharge Condition: Stable   Follow UP  Follow-up Information    Corwin Levins, MD. Schedule an appointment as soon as possible for a visit in 1 week.   Specialties:  Internal Medicine, Radiology Why:  Please call office to make this appointment! Contact information: [address] [place]walbe [facility] [place] [address] [phone]             Discharge Instructions  and  Discharge Medications     Allergies as of 05/08/2018      Reactions   Bee Venom Swelling   Pt states she swells up 3x normal size. If stung "2-3 times, my throat could close up"      Medication List    TAKE these  medications   albuterol 108 (90 Base) MCG/ACT inhaler Commonly known as:  VENTOLIN HFA USE 2 PUFFS EVERY 6 HOURS AS NEEDED FOR WHEEZING   amoxicillin-clavulanate 875-125 MG tablet Commonly known as:  AUGMENTIN Take 1 tablet by mouth 2 (two) times daily for 14 days.   BASAGLAR KWIKPEN 100 UNIT/ML Sopn Inject 0.25 mLs (25 Units total) into the skin daily at 10 pm. What changed:    how much to take  when to take this  reasons to take this   budesonide-formoterol 160-4.5 MCG/ACT inhaler Commonly known as:  SYMBICORT Inhale 2 puffs into the lungs daily.   chlorpheniramine-HYDROcodone 10-8 MG/5ML Suer Commonly known as:  TUSSIONEX Take 5 mLs by mouth every 12 (twelve) hours. Notes to patient:  Last dose was 08:30 thismorning.   diclofenac sodium 1 % Gel Commonly known as:  VOLTAREN Apply 2 g topically 3 (three) times daily. Notes to patient:  None given today   gabapentin 300 MG capsule Commonly known as:  NEURONTIN TAKE 2 CAPSULES (600 MG TOTAL) BY MOUTH 3 (THREE) TIMES DAILY.   guaiFENesin 600 MG 12 hr tablet Commonly known as:  MUCINEX Take 1 tablet (600 mg total) by mouth 2 (two)  times daily.   guaiFENesin-codeine 100-10 MG/5ML syrup Take 5 mLs by mouth every 4 (four) hours as needed for cough.   insulin aspart 100 UNIT/ML injection Commonly known as:  novoLOG Inject 0-15 Units into the skin 4 (four) times daily -  before meals and at bedtime.   insulin aspart 100 UNIT/ML injection Commonly known as:  novoLOG Inject 15 Units into the skin 3 (three) times daily with meals.   insulin lispro 100 UNIT/ML injection Commonly known as:  HUMALOG Inject 0-0.15 mLs (0-15 Units total) into the skin as needed. If BS >200 use 8 units  CBG < 70: Eat or drink something right away and recheck. CBG 70 - 120: 0 units CBG 121 - 150: 2 units CBG 151 - 200: 3 units CBG 201 - 250: 5 units CBG 251 - 300: 8 units CBG 301 - 350: 11 units CBG 351 - 400: 15 units CBG > 400: call  MD.   pantoprazole 40 MG tablet Commonly known as:  PROTONIX Take 1 tablet (40 mg total) by mouth 2 (two) times daily before a meal.   potassium chloride SA 20 MEQ tablet Commonly known as:  K-DUR,KLOR-CON Take 1 tablet (20 mEq total) by mouth daily.   predniSONE 10 MG (21) Tbpk tablet Commonly known as:  STERAPRED UNI-PAK 21 TAB Taper by  10 mg p.o. daily Notes to patient:  Follow tapering instructions carefully   pregabalin 50 MG capsule Commonly known as:  LYRICA Take 1 capsule (50 mg total) by mouth 2 (two) times daily.   saccharomyces boulardii 250 MG capsule Commonly known as:  FLORASTOR Take 1 capsule (250 mg total) by mouth 2 (two) times daily.   SPIRIVA RESPIMAT 1.25 MCG/ACT Aers Generic drug:  Tiotropium Bromide Monohydrate INHALE 2 PUFFS INTO THE LUNGS DAILY.   torsemide 20 MG tablet Commonly known as:  DEMADEX TAKE 1 TABLET BY MOUTH EVERY DAY   torsemide 20 MG tablet Commonly known as:  DEMADEX TAKE 1 TABLET BY MOUTH EVERY DAY   traMADol 50 MG tablet Commonly known as:  ULTRAM TAKE 1 TABLET BY MOUTH EVERY 8 HOURS AS NEEDED FOR SEVERE PAIN         Diet and Activity recommendation: See Discharge Instructions above   Consults obtained -none   Major procedures and Radiology Reports - PLEASE review detailed and final reports for all details, in brief    Dg Chest Portable 1 View  Result Date: 05/03/2018 CLINICAL DATA:  Patient reports a productive cough and burning chest sensation which began yesterday and has worsened. History of asthma-COPD, CHF, and pulmonary hypertension. EXAM: PORTABLE CHEST 1 VIEW COMPARISON:  Chest x-ray of March 05/25/2018 and chest CT scan of the same day FINDINGS: The lungs are mildly hyperinflated. There are patchy airspace opacities obscuring the left cardiac apex. There is no pleural effusion. The heart is top-normal in size. The pulmonary vascularity is not engorged. The mediastinum is normal in width. The observed bony  thorax is normal. IMPRESSION: Hyperinflation consistent with COPD-reactive airway disease. Atelectasis or pneumonia in the lingula. Followup PA and lateral chest X-ray is recommended in 3-4 weeks following trial of antibiotic therapy to ensure resolution and exclude underlying malignancy. Electronically Signed   By: David  [place]azil[place] M.D.   On: 05/03/2018 10:16    Micro Results    No results found for this or any previous visit (from the past 240 hour(s)).     Today   Subjective:   Mary PardonMichelle Cosgriff today has no headache,no  chest abdominal pain,no new weakness tingling or numbness, feels much better wants to go home today.   Objective:   Blood pressure 128/77, pulse 93, temperature 98 F (36.7 C), temperature source Oral, resp. rate 18, height 5\' 2"  (1.575 m), weight 91.2 kg (201 lb), last menstrual period 04/24/2018, SpO2 94 %.  No intake or output data in the 24 hours ending 05/10/18 0859  Exam Awake Alert, Oriented x 3, No new F.N deficits, Normal affect [place].AT,PERRAL Supple Neck,No JVD, No cervical lymphadenopathy appriciated.  Symmetrical Chest wall movement, slight wheezing. RRR,No Gallops,Rubs or new Murmurs, No Parasternal Heave +ve B.Sounds, Abd Soft, Non tender, No organomegaly appriciated, No rebound -guarding or rigidity. No Cyanosis, Clubbing or edema, No new Rash or bruise  Data Review   CBC w Diff:  Lab Results  Component Value Date   WBC 6.6 05/04/2018   HGB 13.9 05/04/2018   HCT 40.2 05/04/2018   PLT 102 (L) 05/04/2018   LYMPHOPCT 12 05/03/2018   MONOPCT 7 05/03/2018   EOSPCT 2 05/03/2018   BASOPCT 0 05/03/2018    CMP:  Lab Results  Component Value Date   NA 130 (L) 05/08/2018   K 4.8 05/08/2018   CL 88 (L) 05/08/2018   CO2 32 05/08/2018   BUN 17 05/08/2018   CREATININE 0.59 05/08/2018   CREATININE 0.65 03/21/2014   PROT 7.9 02/24/2018   ALBUMIN 4.1 02/24/2018   BILITOT 1.4 (H) 02/24/2018   ALKPHOS 141 (H) 02/24/2018   AST 61 (H) 02/24/2018    ALT 57 (H) 02/24/2018  . Patient is high risk for recurrent admission.  Total Time in preparing paper work, data evaluation and todays exam - 35 minutes  Katha Hamming M.D on 05/08/2018 at 8:59 AM    Note: This dictation was prepared with Dragon dictation along with smaller phrase technology. Any transcriptional errors that result from this process are unintentional.

## 2018-05-11 ENCOUNTER — Telehealth: Payer: Self-pay

## 2018-05-11 DIAGNOSIS — J449 Chronic obstructive pulmonary disease, unspecified: Secondary | ICD-10-CM | POA: Diagnosis not present

## 2018-05-11 DIAGNOSIS — I270 Primary pulmonary hypertension: Secondary | ICD-10-CM | POA: Diagnosis not present

## 2018-05-11 DIAGNOSIS — G4733 Obstructive sleep apnea (adult) (pediatric): Secondary | ICD-10-CM | POA: Diagnosis not present

## 2018-05-11 NOTE — Telephone Encounter (Signed)
Flagged on EMMI report for having questions about discharge papers and having other questions or problems.  First attempt to reach patient made 05/11/18 at 1:55pm, however unable to reach patient.  Phone rang once, then went to message from [employer] stating the caller had restrictions in place for receiving calls.   Unable to leave a voicemail.  Will attempt at later time on 05/12/18.

## 2018-05-12 ENCOUNTER — Other Ambulatory Visit: Payer: Self-pay

## 2018-05-12 ENCOUNTER — Inpatient Hospital Stay: Payer: 59 | Admitting: Internal Medicine

## 2018-05-12 ENCOUNTER — Inpatient Hospital Stay (HOSPITAL_COMMUNITY)
Admission: EM | Admit: 2018-05-12 | Discharge: 2018-05-15 | DRG: 190 | Disposition: A | Discharge status: Home / Self Care | Payer: 59 | Attending: Internal Medicine | Admitting: Internal Medicine

## 2018-05-12 ENCOUNTER — Emergency Department (HOSPITAL_COMMUNITY): Payer: 59

## 2018-05-12 ENCOUNTER — Encounter (HOSPITAL_COMMUNITY): Payer: Self-pay

## 2018-05-12 DIAGNOSIS — T380X5A Adverse effect of glucocorticoids and synthetic analogues, initial encounter: Secondary | ICD-10-CM | POA: Diagnosis present

## 2018-05-12 DIAGNOSIS — R0689 Other abnormalities of breathing: Secondary | ICD-10-CM | POA: Diagnosis not present

## 2018-05-12 DIAGNOSIS — J9621 Acute and chronic respiratory failure with hypoxia: Secondary | ICD-10-CM | POA: Diagnosis present

## 2018-05-12 DIAGNOSIS — G4733 Obstructive sleep apnea (adult) (pediatric): Secondary | ICD-10-CM | POA: Diagnosis present

## 2018-05-12 DIAGNOSIS — Z91030 Bee allergy status: Secondary | ICD-10-CM | POA: Diagnosis not present

## 2018-05-12 DIAGNOSIS — K7460 Unspecified cirrhosis of liver: Secondary | ICD-10-CM | POA: Diagnosis present

## 2018-05-12 DIAGNOSIS — E0965 Drug or chemical induced diabetes mellitus with hyperglycemia: Secondary | ICD-10-CM | POA: Diagnosis present

## 2018-05-12 DIAGNOSIS — I5032 Chronic diastolic (congestive) heart failure: Secondary | ICD-10-CM | POA: Diagnosis present

## 2018-05-12 DIAGNOSIS — Z79899 Other long term (current) drug therapy: Secondary | ICD-10-CM

## 2018-05-12 DIAGNOSIS — J9622 Acute and chronic respiratory failure with hypercapnia: Secondary | ICD-10-CM | POA: Diagnosis not present

## 2018-05-12 DIAGNOSIS — F17210 Nicotine dependence, cigarettes, uncomplicated: Secondary | ICD-10-CM | POA: Diagnosis present

## 2018-05-12 DIAGNOSIS — E0940 Drug or chemical induced diabetes mellitus with neurological complications with diabetic neuropathy, unspecified: Secondary | ICD-10-CM | POA: Diagnosis present

## 2018-05-12 DIAGNOSIS — Z9981 Dependence on supplemental oxygen: Secondary | ICD-10-CM | POA: Diagnosis not present

## 2018-05-12 DIAGNOSIS — J9611 Chronic respiratory failure with hypoxia: Secondary | ICD-10-CM | POA: Diagnosis present

## 2018-05-12 DIAGNOSIS — R0602 Shortness of breath: Secondary | ICD-10-CM | POA: Diagnosis not present

## 2018-05-12 DIAGNOSIS — E1140 Type 2 diabetes mellitus with diabetic neuropathy, unspecified: Secondary | ICD-10-CM | POA: Diagnosis present

## 2018-05-12 DIAGNOSIS — E662 Morbid (severe) obesity with alveolar hypoventilation: Secondary | ICD-10-CM | POA: Diagnosis present

## 2018-05-12 DIAGNOSIS — Z794 Long term (current) use of insulin: Secondary | ICD-10-CM | POA: Diagnosis not present

## 2018-05-12 DIAGNOSIS — Z9989 Dependence on other enabling machines and devices: Secondary | ICD-10-CM

## 2018-05-12 DIAGNOSIS — E1169 Type 2 diabetes mellitus with other specified complication: Secondary | ICD-10-CM | POA: Diagnosis present

## 2018-05-12 DIAGNOSIS — Z86711 Personal history of pulmonary embolism: Secondary | ICD-10-CM

## 2018-05-12 DIAGNOSIS — J441 Chronic obstructive pulmonary disease with (acute) exacerbation: Principal | ICD-10-CM | POA: Diagnosis present

## 2018-05-12 DIAGNOSIS — Z6836 Body mass index (BMI) 36.0-36.9, adult: Secondary | ICD-10-CM

## 2018-05-12 DIAGNOSIS — Z7951 Long term (current) use of inhaled steroids: Secondary | ICD-10-CM | POA: Diagnosis not present

## 2018-05-12 DIAGNOSIS — E119 Type 2 diabetes mellitus without complications: Secondary | ICD-10-CM | POA: Diagnosis present

## 2018-05-12 DIAGNOSIS — R0902 Hypoxemia: Secondary | ICD-10-CM | POA: Diagnosis not present

## 2018-05-12 DIAGNOSIS — E669 Obesity, unspecified: Secondary | ICD-10-CM

## 2018-05-12 DIAGNOSIS — J9612 Chronic respiratory failure with hypercapnia: Secondary | ICD-10-CM

## 2018-05-12 DIAGNOSIS — I2721 Secondary pulmonary arterial hypertension: Secondary | ICD-10-CM | POA: Diagnosis present

## 2018-05-12 DIAGNOSIS — Z86718 Personal history of other venous thrombosis and embolism: Secondary | ICD-10-CM | POA: Diagnosis not present

## 2018-05-12 HISTORY — DX: Type 2 diabetes mellitus without complications: E11.9

## 2018-05-12 LAB — CBC WITH DIFFERENTIAL/PLATELET
BASOS ABS: 0 10*3/uL (ref 0.0–0.1)
BASOS PCT: 0 %
EOS PCT: 1 %
Eosinophils Absolute: 0.1 10*3/uL (ref 0.0–0.7)
HCT: 43.9 % (ref 36.0–46.0)
Hemoglobin: 14.5 g/dL (ref 12.0–15.0)
LYMPHS PCT: 10 %
Lymphs Abs: 1 10*3/uL (ref 0.7–4.0)
MCH: 34.3 pg — ABNORMAL HIGH (ref 26.0–34.0)
MCHC: 33 g/dL (ref 30.0–36.0)
MCV: 103.8 fL — AB (ref 78.0–100.0)
Monocytes Absolute: 0.7 10*3/uL (ref 0.1–1.0)
Monocytes Relative: 7 %
NEUTROS ABS: 8 10*3/uL — AB (ref 1.7–7.7)
Neutrophils Relative %: 82 %
PLATELETS: 112 10*3/uL — AB (ref 150–400)
RBC: 4.23 MIL/uL (ref 3.87–5.11)
RDW: 13 % (ref 11.5–15.5)
WBC: 9.8 10*3/uL (ref 4.0–10.5)

## 2018-05-12 LAB — COMPREHENSIVE METABOLIC PANEL
ALK PHOS: 114 U/L (ref 38–126)
ALT: 123 U/L — ABNORMAL HIGH (ref 14–54)
AST: 82 U/L — ABNORMAL HIGH (ref 15–41)
Albumin: 3.8 g/dL (ref 3.5–5.0)
Anion gap: 11 (ref 5–15)
BILIRUBIN TOTAL: 1.6 mg/dL — AB (ref 0.3–1.2)
BUN: 14 mg/dL (ref 6–20)
CALCIUM: 8.9 mg/dL (ref 8.9–10.3)
CO2: 35 mmol/L — ABNORMAL HIGH (ref 22–32)
Chloride: 90 mmol/L — ABNORMAL LOW (ref 101–111)
Creatinine, Ser: 0.52 mg/dL (ref 0.44–1.00)
GFR calc Af Amer: >60 mL/min (ref >60)
Glucose, Bld: 265 mg/dL — ABNORMAL HIGH (ref 65–99)
POTASSIUM: 3.8 mmol/L (ref 3.5–5.1)
Sodium: 136 mmol/L (ref 135–145)
TOTAL PROTEIN: 7.5 g/dL (ref 6.5–8.1)

## 2018-05-12 LAB — URINALYSIS, ROUTINE W REFLEX MICROSCOPIC
BILIRUBIN URINE: NEGATIVE
HGB URINE DIPSTICK: NEGATIVE
KETONES UR: 5 mg/dL — AB
NITRITE: NEGATIVE
PH: 6 (ref 5.0–8.0)
Protein, ur: NEGATIVE mg/dL
SPECIFIC GRAVITY, URINE: 1.03 (ref 1.005–1.030)

## 2018-05-12 LAB — BLOOD GAS, ARTERIAL
Acid-Base Excess: 5.1 mmol/L — ABNORMAL HIGH (ref 0.0–2.0)
BICARBONATE: 31.7 mmol/L — AB (ref 20.0–28.0)
Drawn by: 257881
O2 CONTENT: 2 L/min
O2 SAT: 91 %
PATIENT TEMPERATURE: 98.6
pCO2 arterial: 57.4 mmHg — ABNORMAL HIGH (ref 32.0–48.0)
pH, Arterial: 7.361 (ref 7.350–7.450)
pO2, Arterial: 62.6 mmHg — ABNORMAL LOW (ref 83.0–108.0)

## 2018-05-12 LAB — GLUCOSE, CAPILLARY
GLUCOSE-CAPILLARY: 503 mg/dL — AB (ref 65–99)
Glucose-Capillary: 287 mg/dL — ABNORMAL HIGH (ref 65–99)

## 2018-05-12 LAB — CBG MONITORING, ED
GLUCOSE-CAPILLARY: 353 mg/dL — AB (ref 65–99)
Glucose-Capillary: 478 mg/dL — ABNORMAL HIGH (ref 65–99)
Glucose-Capillary: 496 mg/dL — ABNORMAL HIGH (ref 65–99)

## 2018-05-12 LAB — MAGNESIUM: MAGNESIUM: 1.7 mg/dL (ref 1.7–2.4)

## 2018-05-12 LAB — I-STAT BETA HCG BLOOD, ED (MC, WL, AP ONLY): I-stat hCG, quantitative: <5 m[IU]/mL (ref <5)

## 2018-05-12 LAB — I-STAT CG4 LACTIC ACID, ED: Lactic Acid, Venous: 1.39 mmol/L (ref 0.5–1.9)

## 2018-05-12 LAB — BRAIN NATRIURETIC PEPTIDE: B Natriuretic Peptide: 64.2 pg/mL (ref 0.0–100.0)

## 2018-05-12 MED ORDER — INSULIN GLARGINE 100 UNIT/ML ~~LOC~~ SOLN
40.0000 [IU] | Freq: Every day | SUBCUTANEOUS | Status: DC
  Administered 2018-05-13 – 2018-05-14 (×2): 40 [IU] via SUBCUTANEOUS
  Filled 2018-05-12 (×2): qty 0.4

## 2018-05-12 MED ORDER — INSULIN ASPART 100 UNIT/ML ~~LOC~~ SOLN
10.0000 [IU] | Freq: Once | SUBCUTANEOUS | Status: AC
  Administered 2018-05-12: 10 [IU] via SUBCUTANEOUS

## 2018-05-12 MED ORDER — PREGABALIN 50 MG PO CAPS
50.0000 mg | ORAL_CAPSULE | Freq: Two times a day (BID) | ORAL | Status: DC
  Administered 2018-05-12 – 2018-05-15 (×6): 50 mg via ORAL
  Filled 2018-05-12 (×6): qty 1

## 2018-05-12 MED ORDER — TRAMADOL HCL 50 MG PO TABS
50.0000 mg | ORAL_TABLET | Freq: Three times a day (TID) | ORAL | Status: DC | PRN
  Administered 2018-05-13 – 2018-05-14 (×3): 50 mg via ORAL
  Filled 2018-05-12 (×4): qty 1

## 2018-05-12 MED ORDER — INSULIN ASPART 100 UNIT/ML ~~LOC~~ SOLN
0.0000 [IU] | Freq: Every day | SUBCUTANEOUS | Status: DC
  Administered 2018-05-14: 5 [IU] via SUBCUTANEOUS

## 2018-05-12 MED ORDER — INSULIN ASPART 100 UNIT/ML ~~LOC~~ SOLN
0.0000 [IU] | Freq: Three times a day (TID) | SUBCUTANEOUS | Status: DC
  Administered 2018-05-12: 11 [IU] via SUBCUTANEOUS
  Administered 2018-05-13: 20 [IU] via SUBCUTANEOUS
  Administered 2018-05-13: 7 [IU] via SUBCUTANEOUS
  Administered 2018-05-13: 20 [IU] via SUBCUTANEOUS
  Administered 2018-05-14: 15 [IU] via SUBCUTANEOUS
  Administered 2018-05-14: 20 [IU] via SUBCUTANEOUS
  Administered 2018-05-14: 7 [IU] via SUBCUTANEOUS
  Administered 2018-05-15: 20 [IU] via SUBCUTANEOUS
  Administered 2018-05-15 (×2): 11 [IU] via SUBCUTANEOUS

## 2018-05-12 MED ORDER — ALBUTEROL (5 MG/ML) CONTINUOUS INHALATION SOLN
10.0000 mg/h | INHALATION_SOLUTION | Freq: Once | RESPIRATORY_TRACT | Status: AC
  Administered 2018-05-12: 10 mg/h via RESPIRATORY_TRACT
  Filled 2018-05-12: qty 20

## 2018-05-12 MED ORDER — ENOXAPARIN SODIUM 40 MG/0.4ML ~~LOC~~ SOLN
40.0000 mg | SUBCUTANEOUS | Status: DC
  Administered 2018-05-12 – 2018-05-14 (×3): 40 mg via SUBCUTANEOUS
  Filled 2018-05-12 (×4): qty 0.4

## 2018-05-12 MED ORDER — PANTOPRAZOLE SODIUM 40 MG PO TBEC
40.0000 mg | DELAYED_RELEASE_TABLET | Freq: Two times a day (BID) | ORAL | Status: DC
  Administered 2018-05-12 – 2018-05-15 (×7): 40 mg via ORAL
  Filled 2018-05-12 (×7): qty 1

## 2018-05-12 MED ORDER — HYDROCOD POLST-CPM POLST ER 10-8 MG/5ML PO SUER
5.0000 mL | Freq: Two times a day (BID) | ORAL | Status: DC | PRN
  Administered 2018-05-12 – 2018-05-15 (×6): 5 mL via ORAL
  Filled 2018-05-12 (×6): qty 5

## 2018-05-12 MED ORDER — IPRATROPIUM-ALBUTEROL 0.5-2.5 (3) MG/3ML IN SOLN
3.0000 mL | Freq: Four times a day (QID) | RESPIRATORY_TRACT | Status: DC
  Administered 2018-05-13 – 2018-05-15 (×11): 3 mL via RESPIRATORY_TRACT
  Filled 2018-05-12 (×10): qty 3

## 2018-05-12 MED ORDER — METHYLPREDNISOLONE SODIUM SUCC 40 MG IJ SOLR
40.0000 mg | Freq: Two times a day (BID) | INTRAMUSCULAR | Status: DC
  Administered 2018-05-12 – 2018-05-15 (×7): 40 mg via INTRAVENOUS
  Filled 2018-05-12 (×7): qty 1

## 2018-05-12 MED ORDER — IPRATROPIUM-ALBUTEROL 0.5-2.5 (3) MG/3ML IN SOLN
3.0000 mL | Freq: Four times a day (QID) | RESPIRATORY_TRACT | Status: DC
  Administered 2018-05-12: 3 mL via RESPIRATORY_TRACT
  Filled 2018-05-12: qty 3

## 2018-05-12 MED ORDER — INSULIN GLARGINE 100 UNIT/ML ~~LOC~~ SOLN
15.0000 [IU] | Freq: Once | SUBCUTANEOUS | Status: AC
  Administered 2018-05-12: 15 [IU] via SUBCUTANEOUS
  Filled 2018-05-12: qty 0.15

## 2018-05-12 MED ORDER — AMOXICILLIN-POT CLAVULANATE 875-125 MG PO TABS
1.0000 | ORAL_TABLET | Freq: Two times a day (BID) | ORAL | Status: DC
  Administered 2018-05-12 – 2018-05-15 (×6): 1 via ORAL
  Filled 2018-05-12 (×6): qty 1

## 2018-05-12 MED ORDER — INSULIN ASPART 100 UNIT/ML ~~LOC~~ SOLN
7.0000 [IU] | Freq: Three times a day (TID) | SUBCUTANEOUS | Status: DC
  Administered 2018-05-13 – 2018-05-14 (×4): 7 [IU] via SUBCUTANEOUS

## 2018-05-12 MED ORDER — DM-GUAIFENESIN ER 30-600 MG PO TB12
1.0000 | ORAL_TABLET | Freq: Two times a day (BID) | ORAL | Status: DC
  Administered 2018-05-12 – 2018-05-15 (×7): 1 via ORAL
  Filled 2018-05-12 (×8): qty 1

## 2018-05-12 MED ORDER — INSULIN ASPART 100 UNIT/ML ~~LOC~~ SOLN
4.0000 [IU] | Freq: Three times a day (TID) | SUBCUTANEOUS | Status: DC
  Administered 2018-05-12: 4 [IU] via SUBCUTANEOUS

## 2018-05-12 MED ORDER — INSULIN ASPART 100 UNIT/ML ~~LOC~~ SOLN
10.0000 [IU] | Freq: Once | SUBCUTANEOUS | Status: AC
  Administered 2018-05-12: 10 [IU] via INTRAVENOUS
  Filled 2018-05-12: qty 1

## 2018-05-12 MED ORDER — TORSEMIDE 20 MG PO TABS
20.0000 mg | ORAL_TABLET | Freq: Every day | ORAL | Status: DC
  Administered 2018-05-12 – 2018-05-15 (×4): 20 mg via ORAL
  Filled 2018-05-12 (×4): qty 1

## 2018-05-12 MED ORDER — GABAPENTIN 300 MG PO CAPS
600.0000 mg | ORAL_CAPSULE | Freq: Three times a day (TID) | ORAL | Status: DC
  Administered 2018-05-12 – 2018-05-15 (×9): 600 mg via ORAL
  Filled 2018-05-12 (×9): qty 2

## 2018-05-12 MED ORDER — INSULIN GLARGINE 100 UNIT/ML ~~LOC~~ SOLN
25.0000 [IU] | Freq: Every day | SUBCUTANEOUS | Status: DC
  Administered 2018-05-12: 25 [IU] via SUBCUTANEOUS
  Filled 2018-05-12: qty 0.25

## 2018-05-12 NOTE — ED Notes (Signed)
PT ONLY REQUESTED APPLE SAUCE

## 2018-05-12 NOTE — ED Notes (Signed)
BED ASSIGNMENT PLACED MED-SURG. MD REQUESTING TELEMETRY. BED STATUS WILL NEED TO BE CHANGED. MD PAGED. AWAITING NEW BED. CHARGE SUSAN MADE AWARE.

## 2018-05-12 NOTE — ED Notes (Signed)
Bed: [id number] Expected date:  Expected time:  Means of arrival:  Comments: 40s COPD/ pneumonia

## 2018-05-12 NOTE — ED Notes (Signed)
Patient transported to X-ray 

## 2018-05-12 NOTE — ED Notes (Signed)
SPOKE WITH ADMITTING MD. VERBAL ORDER FOR IV INSULIN AND ADMITTING MD WILL ADD ADDITIONAL INSULIN ORDERS. PT WILL CONTINUE TO GO TO ASSIGNED BED.

## 2018-05-12 NOTE — H&P (Addendum)
History and Physical    Mary HakimMichelle L Gillian YNW:[id number] DOB: 08/26/1976 DOA: 05/12/2018  PCP: Corwin LevinsJohn, [name], MD   Patient coming from:Home     Chief Complaint: Dyspnea  HPI: Mary HakimMichelle L [name] is a 42 y.o. female with medical history significant of COPD, diastolic heart failure, diabetes mellitus type 2, sleep apnea, chronic respiratory insufficiency, diabetic neuropathy who presents to the emergency department with complaints of worsening dyspnea since last couple of days.  Patient was just admitted and was discharged on 05/08/2018 after being managed for pneumonia.  She was still taking antibiotics at home.  Patient has history of COPD and follows with Dr. Marchelle Gearingamaswamy, pulmonology.  She takes inhalers at home.  She is on home oxygen at 3 L/min on baseline and also has home nebulization machine.  Patient says she was not feeling well since last few days, has worsening dyspnea and was also bringing up greenish sputum.  Denied fever or chills. Patient is a positive smoker and is not smoking at the moment she also has history of sleep apnea and uses BiPAP at night.  Patient also reported that her blood sugars were high due to steroids. Patient seen and examined the bedside in the emergency department.  Not in acute respiratory distress but was coughing.  She denies any chest pain, palpitations, abdominal pain, dysuria, nausea, vomiting, diarrhea or headache.  ED Course:   Review of Systems: As per HPI otherwise 10 point review of systems negative.    Past Medical History:  Diagnosis Date  . Asthma   . Axillary adenopathy    right axillary adenopathy noted on CT chest (03/06/2012)  . Chronic right-sided CHF (congestive heart failure) (HCC) 03/23/2012   with cor pulmonale. Last RHC 03/2012  . COPD (chronic obstructive pulmonary disease) (HCC)    PFTS 03/08/12: fev1 0.58L/1%, FVC 1.18/33%, Ratop 49 and c/w ssevere obstruction. 21% BD response on FVC, RV 219%, DLCO 11/54%  . Diabetes mellitus without  complication (HCC)   . DVT of upper extremity (deep vein thrombosis) [facility](HCC) April 2013   right subclavian // Unclear precipitating cause - possibly significant right heart failure, with vascular stasis potentially predisposing to clotting.    . Exertional shortness of breath   . Hepatic cirrhosis (HCC)    Questionable history of - Noted on CT abdomen (03/2012) - thought to be due to vascular congestion from right heart failure +/- patient's history of alcohol abuse  . History of alcoholism (HCC)   . History of chronic bronchitis   . Irregular menses   . Migraine    "~ 1/yr" (05/03/2013)  . Moderate to severe pulmonary hypertension [facility](HCC) April 2013   Cardiac cath on 03/06/12 - 1. Elevated pulmonary artery pressures, right sided filling pressures.,  2. PA: 64/45 (mean 53)    . On home oxygen therapy    "2-3 L 24/7" (05/03/2013)  . OSA (obstructive sleep apnea)    wears noctural BiPAP (05/03/2013)  . Periodontal disease   . Pneumonia ~ 1985; 01/2011  . Pulmonary embolus [facility](HCC) April 2013   Precipitating cause unclear. Was treated with coumadin from April-June 2013.  . Pulmonary hypertension (HCC)     Past Surgical History:  Procedure Laterality Date  . APPENDECTOMY  05/03/2013  . CARDIAC CATHETERIZATION  03/2012  . CARDIAC CATHETERIZATION N/A 01/08/2016   Procedure: Right Heart Cath;  Surgeon: Dolores Pattyaniel R Bensimhon, MD;  [facility];  Service: Cardiovascular;  Laterality: N/A;  . CARDIAC SURGERY  10/16/1977   "my  heart was backwards" (05/03/2013)  . LAPAROSCOPIC APPENDECTOMY N/A 05/03/2013   Procedure: APPENDECTOMY LAPAROSCOPIC;  Surgeon: Cherylynn Ridges, MD;  Location: [facility] OR;  Service: General;  Laterality: N/A;  . LUNG SURGERY  1977/04/28  . RIGHT HEART CATHETERIZATION N/A 03/07/2012   Procedure: RIGHT HEART CATH;  Surgeon: Kathleene Hazel, MD;  Location: [facility] CATH LAB;  Service: Cardiovascular;  Laterality: N/A;     reports that she has been smoking cigarettes.  She has a 6.25  pack-year smoking history. She has never used smokeless tobacco. She reports that she drank alcohol. She reports that she does not use drugs.  Allergies  Allergen Reactions  . Bee Venom Swelling    Pt states she swells up 3x normal size. If stung "2-3 times, my throat could close up"    Family History  Problem Relation Age of Onset  . Hypertension Father   . Heart disease Father        CHF; died age 39   . Alcohol abuse Father   . Other Brother        died age 71 y.o overdose     Prior to Admission medications   Medication Sig Start Date End Date Taking? Authorizing Provider  albuterol (VENTOLIN HFA) 108 (90 Base) MCG/ACT inhaler USE 2 PUFFS EVERY 6 HOURS AS NEEDED FOR WHEEZING 12/13/17  Yes Kalman Shan, MD  amoxicillin-clavulanate (AUGMENTIN) 875-125 MG tablet Take 1 tablet by mouth 2 (two) times daily for 14 days. 05/08/18 05/22/18 Yes Katha Hamming, MD  budesonide-formoterol (SYMBICORT) 160-4.5 MCG/ACT inhaler Inhale 2 puffs into the lungs daily. 04/19/18  Yes Kalman Shan, MD  chlorpheniramine-HYDROcodone (TUSSIONEX) 10-8 MG/5ML SUER Take 5 mLs by mouth every 12 (twelve) hours. 05/08/18  Yes Katha Hamming, MD  gabapentin (NEURONTIN) 300 MG capsule TAKE 2 CAPSULES (600 MG TOTAL) BY MOUTH 3 (THREE) TIMES DAILY. 04/17/18  Yes Corwin Levins, MD  Insulin Glargine [facility]) 100 UNIT/ML SOPN Inject 0.25 mLs (25 Units total) into the skin daily at 10 pm. 05/08/18  Yes Katha Hamming, MD  insulin lispro (HUMALOG) 100 UNIT/ML injection Inject 0-0.15 mLs (0-15 Units total) into the skin as needed. If BS >200 use 8 units  CBG < 70: Eat or drink something right away and recheck. CBG 70 - 120: 0 units CBG 121 - 150: 2 units CBG 151 - 200: 3 units CBG 201 - 250: 5 units CBG 251 - 300: 8 units CBG 301 - 350: 11 units CBG 351 - 400: 15 units CBG > 400: call MD. 01/14/18  Yes Leatha Gilding, MD  pantoprazole (PROTONIX) 40 MG tablet Take 1 tablet (40 mg total) by  mouth 2 (two) times daily before a meal. 07/22/16  Yes Jeralyn Bennett, MD  predniSONE (STERAPRED UNI-PAK 21 TAB) 10 MG (21) TBPK tablet Taper by  10 mg p.o. daily 05/08/18  Yes Katha Hamming, MD  pregabalin (LYRICA) 50 MG capsule Take 1 capsule (50 mg total) by mouth 2 (two) times daily. 10/31/17  Yes Plotnikov, Georgina Quint, MD  saccharomyces boulardii (FLORASTOR) 250 MG capsule Take 1 capsule (250 mg total) by mouth 2 (two) times daily. 02/10/18  Yes Albertine Grates, MD  SPIRIVA RESPIMAT 1.25 MCG/ACT AERS INHALE 2 PUFFS INTO THE LUNGS DAILY. 09/27/17  Yes Kalman Shan, MD  torsemide (DEMADEX) 20 MG tablet TAKE 1 TABLET BY MOUTH EVERY DAY 04/17/18  Yes Corwin Levins, MD  traMADol (ULTRAM) 50 MG tablet TAKE 1 TABLET BY MOUTH EVERY 8 HOURS AS NEEDED FOR  SEVERE PAIN 05/10/18  Yes Corwin Levins, MD  insulin aspart (NOVOLOG) 100 UNIT/ML injection Inject 0-15 Units into the skin 4 (four) times daily -  before meals and at bedtime. Patient not taking: Reported on 05/12/2018 05/08/18   Katha Hamming, MD  insulin aspart (NOVOLOG) 100 UNIT/ML injection Inject 15 Units into the skin 3 (three) times daily with meals. Patient not taking: Reported on 05/12/2018 05/08/18   Katha Hamming, MD    Physical Exam: Vitals:   05/12/18 0930 05/12/18 0931 05/12/18 1100 05/12/18 1130  BP: 122/74  122/70 126/82  Pulse: 96  (!) 118 (!) 110  Resp: 16  (!) 25 (!) 26  Temp:      TempSrc:      SpO2: 94% 96% 93% 94%  Weight:      Height:        Constitutional: Not in respiratory distress, weak Vitals:   05/12/18 0930 05/12/18 0931 05/12/18 1100 05/12/18 1130  BP: 122/74  122/70 126/82  Pulse: 96  (!) 118 (!) 110  Resp: 16  (!) 25 (!) 26  Temp:      TempSrc:      SpO2: 94% 96% 93% 94%  Weight:      Height:       Eyes: PERRL, lids and conjunctivae normal ENMT: Mucous membranes are moist. Posterior pharynx clear of any exudate or lesions.Normal dentition.  Neck: normal, supple, no masses, no  thyromegaly Respiratory: Bilateral decreased air entry, occasional expiratory wheezes Cardiovascular: Regular rate and rhythm, no murmurs / rubs / gallops. No extremity edema. 2+ pedal pulses. No carotid bruits.  Abdomen: no tenderness, no masses palpated. No hepatosplenomegaly. Bowel sounds positive.  Musculoskeletal: no clubbing / cyanosis. No joint deformity upper and lower extremities. Good ROM, no contractures. Normal muscle tone.  Skin: no rashes, lesions, ulcers. No induration Neurologic: CN 2-12 grossly intact. Sensation intact, DTR normal. Strength 5/5 in all 4.  Psychiatric: Normal judgment and insight. Alert and oriented x 3. Normal mood.   Foley Catheter:None  Labs on Admission: I have personally reviewed following labs and imaging studies  CBC: Recent Labs  Lab 05/12/18 0753  WBC 9.8  NEUTROABS 8.0*  HGB 14.5  HCT 43.9  MCV 103.8*  PLT 112*   Basic Metabolic Panel: Recent Labs  Lab 05/08/18 0504 05/12/18 0753 05/12/18 0754  NA 130* 136  --   K 4.8 3.8  --   CL 88* 90*  --   CO2 32 35*  --   GLUCOSE 411* 265*  --   BUN 17 14  --   CREATININE 0.59 0.52  --   CALCIUM 9.1 8.9  --   MG  --   --  1.7   GFR: Estimated Creatinine Clearance: 98.1 mL/min (by C-G formula based on SCr of 0.52 mg/dL). Liver Function Tests: Recent Labs  Lab 05/12/18 0753  AST 82*  ALT 123*  ALKPHOS 114  BILITOT 1.6*  PROT 7.5  ALBUMIN 3.8   No results for input(s): LIPASE, AMYLASE in the last 168 hours. No results for input(s): AMMONIA in the last 168 hours. Coagulation Profile: No results for input(s): INR, PROTIME in the last 168 hours. Cardiac Enzymes: No results for input(s): CKTOTAL, CKMB, CKMBINDEX, TROPONINI in the last 168 hours. BNP (last 3 results) No results for input(s): PROBNP in the last 8760 hours. HbA1C: No results for input(s): HGBA1C in the last 72 hours. CBG: Recent Labs  Lab 05/07/18 1715 05/07/18 1716 05/07/18 2047 05/08/18 0813 05/08/18 1105  GLUCAP 516* 501* 376* 448* 407*   Lipid Profile: No results for input(s): CHOL, HDL, LDLCALC, TRIG, CHOLHDL, LDLDIRECT in the last 72 hours. Thyroid Function Tests: No results for input(s): TSH, T4TOTAL, FREET4, T3FREE, THYROIDAB in the last 72 hours. Anemia Panel: No results for input(s): VITAMINB12, FOLATE, FERRITIN, TIBC, IRON, RETICCTPCT in the last 72 hours. Urine analysis:    Component Value Date/Time   COLORURINE YELLOW 05/12/2018 1112   APPEARANCEUR HAZY (A) 05/12/2018 1112   LABSPEC 1.030 05/12/2018 1112   PHURINE 6.0 05/12/2018 1112   GLUCOSEU >=500 (A) 05/12/2018 1112   GLUCOSEU 100 (A) 02/24/2018 1644   HGBUR NEGATIVE 05/12/2018 1112   BILIRUBINUR NEGATIVE 05/12/2018 1112   KETONESUR 5 (A) 05/12/2018 1112   PROTEINUR NEGATIVE 05/12/2018 1112   UROBILINOGEN 2.0 (A) 02/24/2018 1644   NITRITE NEGATIVE 05/12/2018 1112   LEUKOCYTESUR MODERATE (A) 05/12/2018 1112    Radiological Exams on Admission: Dg Chest 2 View  Result Date: 05/12/2018 CLINICAL DATA:  Shortness of breath EXAM: CHEST - 2 VIEW COMPARISON:  May 03, 2018 chest radiograph and chest CT February 08, 2018 FINDINGS: There is mild scarring in the left base region. There is no appreciable edema or consolidation. Heart is upper normal in size with pulmonary vascularity within normal limits. No adenopathy. No evident bone lesions. IMPRESSION: Scarring left base. No edema or consolidation. Stable cardiac silhouette. Electronically Signed   By: Bretta Bang III M.D.   On: 05/12/2018 08:25     Assessment/Plan Principal Problem:   COPD exacerbation (HCC) Active Problems:   OSA on BiPAP   Obesity hypoventilation syndrome (HCC)   Chronic diastolic heart failure (HCC)   Diabetes mellitus type 2 in obese (HCC)   Diabetic neuropathy (HCC)   Chronic respiratory failure with hypoxia and hypercapnia (HCC)  Acute respiratory failure with hypoxia and hypercarbia : Secondary to COPD exacerbation.  Respiratory status has  improved after she was given breathing treatments.  We will continue to monitor respiratory status.  COPD exacerbation: Has known history of COPD and follows with pulmonology.  He uses nebulizer machine and oxygen at home.  On 3 L oxygen per minute on baseline.  Continue IV steroids.  Supplemental oxygen.  Incentive spirometry.  Patient says he is quit smoking. She was just discharged on 05/08/2018 after treatment for pneumonia.  Taking Augmentin at home which will continue .  Chest x-ray done on admission showed a scaring of left base.  Diabetes mellitus type 2 with hyperglycemia: Her hemoglobin A1c on 02/24/1989 was 6.7.  On insulin at home.  We will continue Lantus and sliding scale insulin here.  We will keep a close eye on her blood sugars as she is will be on steroids.  Obstructive sleep apnea/obesity hypoventilation syndrome: Follows with pulmonology.  On BiPAP at night at home which we will continue.  Diabetic neuropathy: On lyrica at home which we will continue.  History of chronic diastolic heart failure: Currently euvolemic.  Her echocardiogram on 2/18 shows ejection fraction of 65 to 70%, grade 1 diastolic dysfunction.  We will continue torsemide.    Severity of Illness: The appropriate patient status for this patient is INPATIENT.    DVT prophylaxis: Lovenox Code Status: Full Family Communication: Family member present at the bedside consults called: None     Burnadette Pop MD [facility] Pager [phone]  If 7PM-7AM, please contact night-coverage [email or web address] Password [email or web address]  05/12/2018, 1:01 PM

## 2018-05-12 NOTE — Telephone Encounter (Signed)
[facility].  Will not further attempt to reach about EMMI follow up at this time.

## 2018-05-12 NOTE — ED Notes (Signed)
ED Provider at bedside. EDP Isaias CowmanALLAN

## 2018-05-12 NOTE — ED Triage Notes (Addendum)
Patient BIB EMS for SOB for 1 week. Patient was diagnosed with pneumonia and has been on amoxicillin. Patient had a follow up appointment for later today but woke up and saw that her Sp02 was 53% on her Bipap. Patient has a hx of COPD.  Vitals CBG 210 BP 142/86 Sp02 90% P 120

## 2018-05-12 NOTE — ED Provider Notes (Signed)
[facility]-EMERGENCY DEPT Provider Note   CSN: [id number] Arrival date & time: 05/12/18  4098     History   Chief Complaint Chief Complaint  Patient presents with  . Shortness of Breath    HPI Mary [name] is a 42 y.o. female.  42 year old female with history of COPD and recent diagnosis of left-sided pneumonia presents with increased shortness of breath times this morning.  Patient uses home BiPAP and felt more short of breath when she woke up this morning.  Cough is been stable without any new fever.  No chest pain or chest pressure.  Patient always sleeps sitting up.  No leg pain or swelling.  Did have some wheezing and EMS was called and patient given Solu-Medrol and albuterol which she has improved her symptoms significantly.     Past Medical History:  Diagnosis Date  . Asthma   . Axillary adenopathy    right axillary adenopathy noted on CT chest (03/06/2012)  . Chronic right-sided CHF (congestive heart failure) (HCC) 03/23/2012   with cor pulmonale. Last RHC 03/2012  . COPD (chronic obstructive pulmonary disease) (HCC)    PFTS 03/08/12: fev1 0.58L/1%, FVC 1.18/33%, Ratop 49 and c/w ssevere obstruction. 21% BD response on FVC, RV 219%, DLCO 11/54%  . DVT of upper extremity (deep vein thrombosis) [facility]) April 2013   right subclavian // Unclear precipitating cause - possibly significant right heart failure, with vascular stasis potentially predisposing to clotting.    . Exertional shortness of breath   . Hepatic cirrhosis (HCC)    Questionable history of - Noted on CT abdomen (03/2012) - thought to be due to vascular congestion from right heart failure +/- patient's history of alcohol abuse  . History of alcoholism (HCC)   . History of chronic bronchitis   . Irregular menses   . Migraine    "~ 1/yr" (05/03/2013)  . Moderate to severe pulmonary hypertension [facility]) April 2013   Cardiac cath on 03/06/12 - 1. Elevated pulmonary artery pressures, right sided  filling pressures.,  2. PA: 64/45 (mean 53)    . On home oxygen therapy    "2-3 L 24/7" (05/03/2013)  . OSA (obstructive sleep apnea)    wears noctural BiPAP (05/03/2013)  . Periodontal disease   . Pneumonia ~ 1985; 01/2011  . Pulmonary embolus [facility]) April 2013   Precipitating cause unclear. Was treated with coumadin from April-June 2013.  . Pulmonary hypertension [facility])     Patient Active Problem List   Diagnosis Date Noted  . COPD with exacerbation (HCC) 05/03/2018  . Prolonged QT interval 02/08/2018  . COPD with acute exacerbation (HCC) 01/11/2018  . Left flank pain 11/01/2017  . Positive D dimer   . Leukocytosis 04/29/2017  . Acute exacerbation of chronic obstructive pulmonary disease (COPD) (HCC) 04/24/2017  . 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 Left ankle sprain 04/21/2017  . Fungemia 02/13/2017  . COPD (chronic obstructive pulmonary disease) (HCC) 02/13/2017  . Chronic respiratory failure with hypoxia and hypercapnia (HCC) 02/13/2017  . Alcohol abuse 01/31/2017  . Acute on chronic respiratory failure with hypoxia and hypercapnia (HCC)   . Tracheomalacia   . Palliative care by specialist   . Anxiety state   . Advance directive declined by patient   . Goals of care, counseling/discussion   . Chronic hoarseness 07/27/2016  . Diabetic neuropathy (HCC) 04/30/2016  . Diabetes mellitus type 2 in obese (HCC) 04/10/2016  . Chronic diastolic heart failure (HCC) 01/25/2016  . PAH (pulmonary artery  hypertension) (HCC)   . Constipation 11/18/2015  . Morbid obesity (HCC) 11/18/2015  . Lung nodule 01/30/2015  . Pulmonary mass 01/13/2015  . Gastroparesis   . Esophageal reflux   . Obstipation   . Hypokalemia 02/28/2013  . Financial difficulties 02/28/2013  . Obesity hypoventilation syndrome (HCC) 02/28/2013  . Myalgia 02/28/2013  . Anovulation 06/30/2012  . Preventative health care 05/05/2012  . Irregular menstrual cycle 04/27/2012  . Chronic right-sided CHF (congestive  heart failure) (HCC) 03/23/2012  . Pulmonary hypertension (HCC) 03/23/2012  . Cor pulmonale (HCC) 03/21/2012  . OSA on BiPAP 03/19/2012  . Axillary lymphadenopathy 03/06/2012  . Congenital heart disease 01/28/2012  . Dental caries 01/28/2012    Past Surgical History:  Procedure Laterality Date  . APPENDECTOMY  05/03/2013  . CARDIAC CATHETERIZATION  03/2012  . CARDIAC CATHETERIZATION N/A 01/08/2016   Procedure: Right Heart Cath;  Surgeon: Dolores Pattyaniel R Bensimhon, MD;  [facility];  Service: Cardiovascular;  Laterality: N/A;  . CARDIAC SURGERY  10/16/1977   "my heart was backwards" (05/03/2013)  . LAPAROSCOPIC APPENDECTOMY N/A 05/03/2013   Procedure: APPENDECTOMY LAPAROSCOPIC;  Surgeon: Cherylynn RidgesJames O Wyatt, MD;  [facility];  Service: General;  Laterality: N/A;  . LUNG SURGERY  10/16/1977  . RIGHT HEART CATHETERIZATION N/A 03/07/2012   Procedure: RIGHT HEART CATH;  Surgeon: Kathleene Hazelhristopher D McAlhany, MD;  [facility];  Service: Cardiovascular;  Laterality: N/A;     OB History   None      Home Medications    Prior to Admission medications   Medication Sig Start Date End Date Taking? Authorizing Provider  albuterol (VENTOLIN HFA) 108 (90 Base) MCG/ACT inhaler USE 2 PUFFS EVERY 6 HOURS AS NEEDED FOR WHEEZING 12/13/17   Kalman Shanamaswamy, Murali, MD  amoxicillin-clavulanate (AUGMENTIN) 875-125 MG tablet Take 1 tablet by mouth 2 (two) times daily for 14 days. 05/08/18 05/22/18  Katha HammingKonidena, Snehalatha, MD  budesonide-formoterol (SYMBICORT) 160-4.5 MCG/ACT inhaler Inhale 2 puffs into the lungs daily. 04/19/18   Kalman Shanamaswamy, Murali, MD  chlorpheniramine-HYDROcodone (TUSSIONEX) 10-8 MG/5ML SUER Take 5 mLs by mouth every 12 (twelve) hours. 05/08/18   Katha HammingKonidena, Snehalatha, MD  diclofenac sodium (VOLTAREN) 1 % GEL Apply 2 g topically 3 (three) times daily. Patient not taking: Reported on 05/03/2018 09/28/17   Burna CashSantos-Sanchez, Idalys, MD  gabapentin (NEURONTIN) 300 MG capsule TAKE 2 CAPSULES (600 MG  TOTAL) BY MOUTH 3 (THREE) TIMES DAILY. 04/17/18   Corwin LevinsJohn, [name], MD  guaiFENesin (MUCINEX) 600 MG 12 hr tablet Take 1 tablet (600 mg total) by mouth 2 (two) times daily. Patient not taking: Reported on 05/03/2018 02/11/17   Vassie LollMadera, Carlos, MD  guaiFENesin-codeine 100-10 MG/5ML syrup Take 5 mLs by mouth every 4 (four) hours as needed for cough. Patient not taking: Reported on 05/03/2018 02/10/18   Albertine GratesXu, Fang, MD  insulin aspart (NOVOLOG) 100 UNIT/ML injection Inject 0-15 Units into the skin 4 (four) times daily -  before meals and at bedtime. 05/08/18   Katha HammingKonidena, Snehalatha, MD  insulin aspart (NOVOLOG) 100 UNIT/ML injection Inject 15 Units into the skin 3 (three) times daily with meals. 05/08/18   Katha HammingKonidena, Snehalatha, MD  Insulin Glargine (BASAGLAR KWIKPEN) 100 UNIT/ML SOPN Inject 0.25 mLs (25 Units total) into the skin daily at 10 pm. 05/08/18   Katha HammingKonidena, Snehalatha, MD  insulin lispro (HUMALOG) 100 UNIT/ML injection Inject 0-0.15 mLs (0-15 Units total) into the skin as needed. If BS >200 use 8 units  CBG < 70: Eat or drink something right away and recheck.  CBG 70 - 120: 0 units CBG 121 - 150: 2 units CBG 151 - 200: 3 units CBG 201 - 250: 5 units CBG 251 - 300: 8 units CBG 301 - 350: 11 units CBG 351 - 400: 15 units CBG > 400: call MD. 01/14/18   Leatha Gilding, MD  pantoprazole (PROTONIX) 40 MG tablet Take 1 tablet (40 mg total) by mouth 2 (two) times daily before a meal. Patient not taking: Reported on 05/03/2018 07/22/16   Jeralyn Bennett, MD  potassium chloride SA (K-DUR,KLOR-CON) 20 MEQ tablet Take 1 tablet (20 mEq total) by mouth daily. Patient not taking: Reported on 05/03/2018 01/29/16   Bensimhon, Bevelyn Buckles, MD  predniSONE (STERAPRED UNI-PAK 21 TAB) 10 MG (21) TBPK tablet Taper by  10 mg p.o. daily 05/08/18   Katha Hamming, MD  pregabalin (LYRICA) 50 MG capsule Take 1 capsule (50 mg total) by mouth 2 (two) times daily. 10/31/17   Plotnikov, Georgina Quint, MD  saccharomyces boulardii (FLORASTOR)  250 MG capsule Take 1 capsule (250 mg total) by mouth 2 (two) times daily. 02/10/18   Albertine Grates, MD  SPIRIVA RESPIMAT 1.25 MCG/ACT AERS INHALE 2 PUFFS INTO THE LUNGS DAILY. 09/27/17   Kalman Shan, MD  torsemide (DEMADEX) 20 MG tablet TAKE 1 TABLET BY MOUTH EVERY DAY Patient not taking: Reported on 05/03/2018 03/27/18   Corwin Levins, MD  torsemide (DEMADEX) 20 MG tablet TAKE 1 TABLET BY MOUTH EVERY DAY 04/17/18   Corwin Levins, MD  traMADol (ULTRAM) 50 MG tablet TAKE 1 TABLET BY MOUTH EVERY 8 HOURS AS NEEDED FOR SEVERE PAIN 05/10/18   Corwin Levins, MD    Family History Family History  Problem Relation Age of Onset  . Hypertension Father   . Heart disease Father        CHF; died age 56   . Alcohol abuse Father   . Other Brother        died age 63 y.o overdose    Social History Social History   Tobacco Use  . Smoking status: Current Some Day Smoker    Packs/day: 0.25    Years: 25.00    Pack years: 6.25    Types: Cigarettes    Last attempt to quit: 01/08/2018    Years since quitting: 0.3  . Smokeless tobacco: Never Used  . Tobacco comment: a couple drags of one cigarettes  Substance Use Topics  . Alcohol use: Not Currently  . Drug use: No     Allergies   Bee venom   Review of Systems Review of Systems  All other systems reviewed and are negative.    Physical Exam Updated Vital Signs BP 132/83   Pulse (!) 121   Temp 98.1 F (36.7 C) (Oral)   Resp (!) 25   Ht 1.575 m (5\' 2" )   Wt 91.2 kg (201 lb)   LMP 04/24/2018 (Approximate)   SpO2 100%   BMI 36.76 kg/m   Physical Exam  Constitutional: She is oriented to person, place, and time. She appears well-developed and well-nourished.  Non-toxic appearance. No distress.  HENT:  Head: Normocephalic and atraumatic.  Eyes: Pupils are equal, round, and reactive to light. Conjunctivae, EOM and lids are normal.  Neck: Normal range of motion. Neck supple. No tracheal deviation present. No thyroid mass present.    Cardiovascular: Regular rhythm and normal heart sounds. Tachycardia present. Exam reveals no gallop.  No murmur heard. Pulmonary/Chest: No stridor. Tachypnea noted. She is in respiratory distress.  She has decreased breath sounds in the right lower field and the left lower field. She has wheezes in the right lower field and the left lower field. She has no rhonchi. She has no rales.  Abdominal: Soft. Normal appearance and bowel sounds are normal. She exhibits no distension. There is no tenderness. There is no rebound and no CVA tenderness.  Musculoskeletal: Normal range of motion. She exhibits no edema or tenderness.  Neurological: She is alert and oriented to person, place, and time. She has normal strength. No cranial nerve deficit or sensory deficit. GCS eye subscore is 4. GCS verbal subscore is 5. GCS motor subscore is 6.  Skin: Skin is warm and dry. No abrasion and no rash noted.  Psychiatric: She has a normal mood and affect. Her speech is normal and behavior is normal.  Nursing note and vitals reviewed.    ED Treatments / Results  Labs (all labs ordered are listed, but only abnormal results are displayed) Labs Reviewed  CULTURE, BLOOD (ROUTINE X 2)  CULTURE, BLOOD (ROUTINE X 2)  COMPREHENSIVE METABOLIC PANEL  CBC WITH DIFFERENTIAL/PLATELET  URINALYSIS, ROUTINE W REFLEX MICROSCOPIC  BRAIN NATRIURETIC PEPTIDE  I-STAT CG4 LACTIC ACID, ED  I-STAT BETA HCG BLOOD, ED ([facility], [facility], [facility] ONLY)  I-STAT CG4 LACTIC ACID, ED    EKG EKG Interpretation  Date/Time:  Friday May 12 2018 07:38:51 EDT Ventricular Rate:  123 PR Interval:    QRS Duration: 88 QT Interval:  333 QTC Calculation: 477 R Axis:   98 Text Interpretation:  Sinus tachycardia LAE, consider biatrial enlargement Borderline right axis deviation Nonspecific T abnrm, anterolateral leads Confirmed by Lorre Nick ([id number]) on 05/12/2018 7:43:34 AM   Radiology No results found.  Procedures Procedures (including critical care  time)  Medications Ordered in ED Medications - No data to display   Initial Impression / Assessment and Plan / ED Course  I have reviewed the triage vital signs and the nursing notes.  Pertinent labs & imaging results that were available during my care of the patient were reviewed by me and considered in my medical decision making (see chart for details).     Patient received albuterol along with site Medrol prior to arrival and breathing is slightly improved.  Was given 10 mg albuterol treatment here after monitoring still remains dyspneic.  Chest x-ray without acute infiltrate.  BMP within normal limits.  COPD exacerbation will admit to the hospitalist service  Final Clinical Impressions(s) / ED Diagnoses   Final diagnoses:  None    ED Discharge Orders    None       Lorre Nick, MD 05/12/18 [phone]

## 2018-05-13 DIAGNOSIS — J441 Chronic obstructive pulmonary disease with (acute) exacerbation: Principal | ICD-10-CM

## 2018-05-13 LAB — GLUCOSE, CAPILLARY
GLUCOSE-CAPILLARY: 359 mg/dL — AB (ref 65–99)
GLUCOSE-CAPILLARY: 408 mg/dL — AB (ref 65–99)
Glucose-Capillary: 382 mg/dL — ABNORMAL HIGH (ref 65–99)
Glucose-Capillary: 389 mg/dL — ABNORMAL HIGH (ref 65–99)
Glucose-Capillary: 226 mg/dL — ABNORMAL HIGH (ref 65–99)

## 2018-05-13 LAB — CBC
HEMATOCRIT: 42.6 % (ref 36.0–46.0)
Hemoglobin: 13.8 g/dL (ref 12.0–15.0)
MCH: 33.2 pg (ref 26.0–34.0)
MCHC: 32.4 g/dL (ref 30.0–36.0)
MCV: 102.4 fL — ABNORMAL HIGH (ref 78.0–100.0)
PLATELETS: 107 10*3/uL — AB (ref 150–400)
RBC: 4.16 MIL/uL (ref 3.87–5.11)
RDW: 12.7 % (ref 11.5–15.5)
WBC: 8.1 10*3/uL (ref 4.0–10.5)

## 2018-05-13 LAB — BASIC METABOLIC PANEL
Anion gap: 11 (ref 5–15)
BUN: 19 mg/dL (ref 6–20)
CALCIUM: 9.5 mg/dL (ref 8.9–10.3)
CO2: 38 mmol/L — ABNORMAL HIGH (ref 22–32)
Chloride: 88 mmol/L — ABNORMAL LOW (ref 101–111)
Creatinine, Ser: 0.79 mg/dL (ref 0.44–1.00)
GFR calc Af Amer: >60 mL/min (ref >60)
GLUCOSE: 384 mg/dL — AB (ref 65–99)
POTASSIUM: 4.4 mmol/L (ref 3.5–5.1)
SODIUM: 137 mmol/L (ref 135–145)

## 2018-05-13 MED ORDER — IPRATROPIUM-ALBUTEROL 0.5-2.5 (3) MG/3ML IN SOLN
3.0000 mL | Freq: Four times a day (QID) | RESPIRATORY_TRACT | Status: DC | PRN
  Administered 2018-05-13 – 2018-05-14 (×2): 3 mL via RESPIRATORY_TRACT
  Filled 2018-05-13 (×2): qty 3

## 2018-05-13 MED ORDER — INSULIN ASPART 100 UNIT/ML ~~LOC~~ SOLN
15.0000 [IU] | Freq: Once | SUBCUTANEOUS | Status: AC
  Administered 2018-05-13: 15 [IU] via SUBCUTANEOUS

## 2018-05-13 NOTE — Progress Notes (Signed)
PROGRESS NOTE    Mary [name]  ZOX:[id number] DOB: 04/01/1976 DOA: 05/12/2018 PCP: Corwin LevinsJohn, [name], MD   Brief Narrative:  42 year old with severe COPD on 3 L nasal cannula, obstructive sleep apnea/obesity hypoventilation syndrome on BiPAP, chronic diastolic heart failure, WHO grade 2 pulmonary arterial hypertension mild by right heart catheterization on 01/07/2018, type 2 diabetes secondary to chronic and recurrent steroid use, complicated by diabetic neuropathy who comes in with acute COPD exacerbation.   Assessment & Plan:   Principal Problem:   COPD exacerbation (HCC) Active Problems:   OSA on BiPAP   Obesity hypoventilation syndrome (HCC)   Chronic diastolic heart failure (HCC)   Diabetes mellitus type 2 in obese (HCC)   Diabetic neuropathy (HCC)   Chronic respiratory failure with hypoxia and hypercapnia (HCC)   #) Acute COPD exacerbation: Patient continues to improve. -Continue ICS/LABA, L AMA - Every 4 hours duo nebs plus as needed -IV methylprednisolone 40 mg every 12 - No antibiotics at this time  #) Type 2 diabetes on insulin: Likely secondary to recurrent and chronic steroid use -Continue glargine 40 units nightly -Continue 7 units 3 times daily aspart with meals -Sliding scale insulin, AC at bedtime   #) Chronic diastolic heart failure: -Continue torsemide 20 mg daily  #) Diabetic neuropathy: -Continue gabapentin 600 mg 3 times daily -Continue pregabalin 50 mg twice daily  Fluids: Tolerating p.o. Elect lites: Monitor and supplement Nutrition: Carb restricted diet  Prophylaxis: Enoxaparin  Disposition: Pending improved respiratory status  Full code   Consultants:   None  Procedures:   None  Antimicrobials:   None   Subjective: Patient reports that she feels better than admission.  She continues to have shortness of breath with exertion.  Her cough is improved somewhat.  She denies any chest pain, lower extremity edema, nausea, vomiting,  diarrhea, abdominal pain.  Objective: Vitals:   05/12/18 2141 05/13/18 0229 05/13/18 0542 05/13/18 0807  BP: 133/80  (!) 134/96   Pulse: 90  85   Resp: 20  (!) 21   Temp: 98 F (36.7 C)  97.8 F (36.6 C)   TempSrc: Oral  Oral   SpO2: 94% 91% 93% 92%  Weight:      Height:        Intake/Output Summary (Last 24 hours) at 05/13/2018 1314 Last data filed at 05/13/2018 0915 Gross per 24 hour  Intake 960 ml  Output -  Net 960 ml   Filed Weights   05/12/18 0722 05/12/18 1746  Weight: 91.2 kg (201 lb) 90.7 kg (200 lb)    Examination:  General exam: No acute distress Respiratory system: Mildly increased work of breathing, inspiratory and expiratory wheezes in anterior and posterior lung fields, prolonged expiratory phase, no rhonchi or crackles Cardiovascular system: Distant heart sounds, regular rate and rhythm, no murmurs Gastrointestinal system: Abdomen is nondistended, soft and nontender. No organomegaly or masses felt. Normal bowel sounds heard. Central nervous system: Alert and oriented. No focal neurological deficits. Extremities: Trace lower extremity edema Skin: Friable skin, numerous contusions noted Psychiatry: Judgement and insight appear normal. Mood & affect appropriate.     Data Reviewed: I have personally reviewed following labs and imaging studies  CBC: Recent Labs  Lab 05/12/18 0753 05/13/18 0342  WBC 9.8 8.1  NEUTROABS 8.0*  --   HGB 14.5 13.8  HCT 43.9 42.6  MCV 103.8* 102.4*  PLT 112* 107*   Basic Metabolic Panel: Recent Labs  Lab 05/08/18 0504 05/12/18 0753 05/12/18 0754 05/13/18 0342  NA 130* 136  --  137  K 4.8 3.8  --  4.4  CL 88* 90*  --  88*  CO2 32 35*  --  38*  GLUCOSE 411* 265*  --  384*  BUN 17 14  --  19  CREATININE 0.59 0.52  --  0.79  CALCIUM 9.1 8.9  --  9.5  MG  --   --  1.7  --    GFR: Estimated Creatinine Clearance: 97.8 mL/min (by C-G formula based on SCr of 0.79 mg/dL). Liver Function Tests: Recent Labs  Lab  05/12/18 0753  AST 82*  ALT 123*  ALKPHOS 114  BILITOT 1.6*  PROT 7.5  ALBUMIN 3.8   No results for input(s): LIPASE, AMYLASE in the last 168 hours. No results for input(s): AMMONIA in the last 168 hours. Coagulation Profile: No results for input(s): INR, PROTIME in the last 168 hours. Cardiac Enzymes: No results for input(s): CKTOTAL, CKMB, CKMBINDEX, TROPONINI in the last 168 hours. BNP (last 3 results) No results for input(s): PROBNP in the last 8760 hours. HbA1C: No results for input(s): HGBA1C in the last 72 hours. CBG: Recent Labs  Lab 05/12/18 1825 05/12/18 2137 05/13/18 0728 05/13/18 0729 05/13/18 1229  GLUCAP 287* 503* 389* 382* 226*   Lipid Profile: No results for input(s): CHOL, HDL, LDLCALC, TRIG, CHOLHDL, LDLDIRECT in the last 72 hours. Thyroid Function Tests: No results for input(s): TSH, T4TOTAL, FREET4, T3FREE, THYROIDAB in the last 72 hours. Anemia Panel: No results for input(s): VITAMINB12, FOLATE, FERRITIN, TIBC, IRON, RETICCTPCT in the last 72 hours. Sepsis Labs: Recent Labs  Lab 05/12/18 0804  LATICACIDVEN 1.39    No results found for this or any previous visit (from the past 240 hour(s)).       Radiology Studies: Dg Chest 2 View  Result Date: 05/12/2018 CLINICAL DATA:  Shortness of breath EXAM: CHEST - 2 VIEW COMPARISON:  May 03, 2018 chest radiograph and chest CT February 08, 2018 FINDINGS: There is mild scarring in the left base region. There is no appreciable edema or consolidation. Heart is upper normal in size with pulmonary vascularity within normal limits. No adenopathy. No evident bone lesions. IMPRESSION: Scarring left base. No edema or consolidation. Stable cardiac silhouette. Electronically Signed   By: Bretta Bang III M.D.   On: 05/12/2018 08:25        Scheduled Meds: . amoxicillin-clavulanate  1 tablet Oral BID  . dextromethorphan-guaiFENesin  1 tablet Oral BID  . enoxaparin (LOVENOX) injection  40 mg Subcutaneous Q24H    . gabapentin  600 mg Oral TID  . insulin aspart  0-20 Units Subcutaneous TID WC  . insulin aspart  0-5 Units Subcutaneous QHS  . insulin aspart  7 Units Subcutaneous TID WC  . insulin glargine  40 Units Subcutaneous Daily  . ipratropium-albuterol  3 mL Nebulization QID  . methylPREDNISolone (SOLU-MEDROL) injection  40 mg Intravenous Q12H  . pantoprazole  40 mg Oral BID AC  . pregabalin  50 mg Oral BID  . torsemide  20 mg Oral Daily   Continuous Infusions:   LOS: 1 day    Time spent: 35    Delaine Lame, MD [facility]   If 7PM-7AM, please contact night-coverage [email or web address] Password [email or web address] 05/13/2018, 1:14 PM

## 2018-05-14 LAB — BASIC METABOLIC PANEL
CO2: 37 mmol/L — ABNORMAL HIGH (ref 22–32)
Calcium: 9.4 mg/dL (ref 8.9–10.3)
Creatinine, Ser: 0.44 mg/dL (ref 0.44–1.00)
GFR calc Af Amer: >60 mL/min (ref >60)
Potassium: 4.5 mmol/L (ref 3.5–5.1)
Sodium: 133 mmol/L — ABNORMAL LOW (ref 135–145)

## 2018-05-14 LAB — GLUCOSE, CAPILLARY
GLUCOSE-CAPILLARY: 239 mg/dL — AB (ref 65–99)
Glucose-Capillary: 405 mg/dL — ABNORMAL HIGH (ref 65–99)
Glucose-Capillary: 353 mg/dL — ABNORMAL HIGH (ref 65–99)
Glucose-Capillary: 366 mg/dL — ABNORMAL HIGH (ref 65–99)

## 2018-05-14 LAB — CBC
HCT: 43.1 % (ref 36.0–46.0)
Hemoglobin: 14.2 g/dL (ref 12.0–15.0)
MCH: 33.5 pg (ref 26.0–34.0)
MCHC: 32.9 g/dL (ref 30.0–36.0)
MCV: 101.7 fL — ABNORMAL HIGH (ref 78.0–100.0)
Platelets: 120 10*3/uL — ABNORMAL LOW (ref 150–400)
RBC: 4.24 MIL/uL (ref 3.87–5.11)
RDW: 12.6 % (ref 11.5–15.5)
WBC: 12 K/uL — ABNORMAL HIGH (ref 4.0–10.5)

## 2018-05-14 LAB — BASIC METABOLIC PANEL WITH GFR
Anion gap: 8 (ref 5–15)
BUN: 17 mg/dL (ref 6–20)
Chloride: 88 mmol/L — ABNORMAL LOW (ref 101–111)
GFR calc non Af Amer: >60 mL/min (ref >60)
Glucose, Bld: 358 mg/dL — ABNORMAL HIGH (ref 65–99)

## 2018-05-14 LAB — MAGNESIUM: Magnesium: 1.9 mg/dL (ref 1.7–2.4)

## 2018-05-14 MED ORDER — INSULIN ASPART 100 UNIT/ML ~~LOC~~ SOLN
15.0000 [IU] | Freq: Three times a day (TID) | SUBCUTANEOUS | Status: DC
  Administered 2018-05-14 – 2018-05-15 (×5): 15 [IU] via SUBCUTANEOUS

## 2018-05-14 MED ORDER — INSULIN GLARGINE 100 UNIT/ML ~~LOC~~ SOLN
50.0000 [IU] | Freq: Every day | SUBCUTANEOUS | Status: DC
  Administered 2018-05-15: 50 [IU] via SUBCUTANEOUS
  Filled 2018-05-14: qty 0.5

## 2018-05-14 NOTE — Progress Notes (Signed)
PROGRESS NOTE    Mary [name]  ZOX:[id number] DOB: 12/25/1975 DOA: 05/12/2018 PCP: Corwin LevinsJohn, [name], MD   Brief Narrative:  42 year old with severe COPD on 3 L nasal cannula, obstructive sleep apnea/obesity hypoventilation syndrome on BiPAP, chronic diastolic heart failure, WHO grade 2 pulmonary arterial hypertension mild by right heart catheterization on 01/07/2018, type 2 diabetes secondary to chronic and recurrent steroid use, complicated by diabetic neuropathy who comes in with acute COPD exacerbation.   Assessment & Plan:   Principal Problem:   COPD exacerbation (HCC) Active Problems:   OSA on BiPAP   Obesity hypoventilation syndrome (HCC)   Chronic diastolic heart failure (HCC)   Diabetes mellitus type 2 in obese (HCC)   Diabetic neuropathy (HCC)   Chronic respiratory failure with hypoxia and hypercapnia (HCC)   #) Acute COPD exacerbation: Patient continues to have intermittent desaturations -Continue ICS/LABA, L AMA - Every 4 hours duo nebs plus as needed -IV methylprednisolone 40 mg every 12 - Continue Augmentin started 05/12/2018  #) Type 2 diabetes on insulin: Likely secondary to recurrent and chronic steroid use - increase glargine to 50 units nightly -Increase to 15 units 3 times daily aspart with meals -Sliding scale insulin steroid resistant, AC at bedtime   #) Chronic diastolic heart failure: -Continue torsemide 20 mg daily  #) Diabetic neuropathy: -Continue gabapentin 600 mg 3 times daily -Continue pregabalin 50 mg twice daily  Fluids: Tolerating p.o. Electrolytes: Monitor and supplement Nutrition: Carb restricted diet  Prophylaxis: Enoxaparin  Disposition: Pending improved respiratory status  Full code   Consultants:   None  Procedures:   None  Antimicrobials:   Augmentin started 05/12/2018   Subjective: Patient reports that she feels better than admission but continues to work shortness of breath and intermittent desaturations.  She  continues to have wheezing and shortness of breath on exertion.  She denies any nausea, vomiting, diarrhea, chest pain.  Objective: Vitals:   05/13/18 2231 05/14/18 0238 05/14/18 0625 05/14/18 0816  BP:   (!) 132/95   Pulse:   74   Resp:   18   Temp:   98 F (36.7 C)   TempSrc:   Oral   SpO2: 93% 95% 94% 90%  Weight:      Height:        Intake/Output Summary (Last 24 hours) at 05/14/2018 1108 Last data filed at 05/13/2018 1706 Gross per 24 hour  Intake 720 ml  Output -  Net 720 ml   Filed Weights   05/12/18 0722 05/12/18 1746  Weight: 91.2 kg (201 lb) 90.7 kg (200 lb)    Examination:  General exam: No acute distress Respiratory system: No increased work of breathing, expiratory wheezes in anterior and posterior lung fields, prolonged expiratory phase, no rhonchi or crackles Cardiovascular system: Distant heart sounds, regular rate and rhythm, no murmurs Gastrointestinal system: Abdomen is nondistended, soft and nontender. No organomegaly or masses felt. Normal bowel sounds heard. Central nervous system: Alert and oriented. No focal neurological deficits. Extremities: Trace lower extremity edema Skin: Friable skin, numerous contusions noted Psychiatry: Judgement and insight appear normal. Mood & affect appropriate.     Data Reviewed: I have personally reviewed following labs and imaging studies  CBC: Recent Labs  Lab 05/12/18 0753 05/13/18 0342 05/14/18 0426  WBC 9.8 8.1 12.0*  NEUTROABS 8.0*  --   --   HGB 14.5 13.8 14.2  HCT 43.9 42.6 43.1  MCV 103.8* 102.4* 101.7*  PLT 112* 107* 120*   Basic Metabolic Panel:  Recent Labs  Lab 05/08/18 0504 05/12/18 0753 05/12/18 0754 05/13/18 0342 05/14/18 0426  NA 130* 136  --  137 133*  K 4.8 3.8  --  4.4 4.5  CL 88* 90*  --  88* 88*  CO2 32 35*  --  38* 37*  GLUCOSE 411* 265*  --  384* 358*  BUN 17 14  --  19 17  CREATININE 0.59 0.52  --  0.79 0.44  CALCIUM 9.1 8.9  --  9.5 9.4  MG  --   --  1.7  --  1.9    GFR: Estimated Creatinine Clearance: 97.8 mL/min (by C-G formula based on SCr of 0.44 mg/dL). Liver Function Tests: Recent Labs  Lab 05/12/18 0753  AST 82*  ALT 123*  ALKPHOS 114  BILITOT 1.6*  PROT 7.5  ALBUMIN 3.8   No results for input(s): LIPASE, AMYLASE in the last 168 hours. No results for input(s): AMMONIA in the last 168 hours. Coagulation Profile: No results for input(s): INR, PROTIME in the last 168 hours. Cardiac Enzymes: No results for input(s): CKTOTAL, CKMB, CKMBINDEX, TROPONINI in the last 168 hours. BNP (last 3 results) No results for input(s): PROBNP in the last 8760 hours. HbA1C: No results for input(s): HGBA1C in the last 72 hours. CBG: Recent Labs  Lab 05/13/18 0729 05/13/18 1229 05/13/18 1704 05/13/18 2213 05/14/18 0738  GLUCAP 382* 226* 359* 408* 405*   Lipid Profile: No results for input(s): CHOL, HDL, LDLCALC, TRIG, CHOLHDL, LDLDIRECT in the last 72 hours. Thyroid Function Tests: No results for input(s): TSH, T4TOTAL, FREET4, T3FREE, THYROIDAB in the last 72 hours. Anemia Panel: No results for input(s): VITAMINB12, FOLATE, FERRITIN, TIBC, IRON, RETICCTPCT in the last 72 hours. Sepsis Labs: Recent Labs  Lab 05/12/18 0804  LATICACIDVEN 1.39    Recent Results (from the past 240 hour(s))  Blood Culture (routine x 2)     Status: None (Preliminary result)   Collection Time: 05/12/18  7:53 AM  Result Value Ref Range Status   Specimen Description   Final    BLOOD LEFT HAND Performed at [facility], [address], [place], [place] [address]    Special Requests   Final    BOTTLES DRAWN AEROBIC AND ANAEROBIC Blood Culture adequate volume Performed at [facility], [address], [place], [place] [address]    Culture   Final    NO GROWTH 1 DAY Performed at [facility], [address], [place], [place] [address]    Report Status PENDING  Incomplete  Blood Culture (routine x 2)     Status:  None (Preliminary result)   Collection Time: 05/12/18  8:30 AM  Result Value Ref Range Status   Specimen Description   Final    BLOOD LEFT ANTECUBITAL Performed at [facility], [address], [place], [place] [address]    Special Requests   Final    BOTTLES DRAWN AEROBIC AND ANAEROBIC Blood Culture adequate volume Performed at [facility], [address], [place], [place] [address]    Culture   Final    NO GROWTH 1 DAY Performed at [facility], [address], [place], [place] [address]    Report Status PENDING  Incomplete         Radiology Studies: No results found.      Scheduled Meds: . amoxicillin-clavulanate  1 tablet Oral BID  . dextromethorphan-guaiFENesin  1 tablet Oral BID  . enoxaparin (LOVENOX)  injection  40 mg Subcutaneous Q24H  . gabapentin  600 mg Oral TID  . insulin aspart  0-20 Units Subcutaneous TID WC  . insulin aspart  0-5 Units Subcutaneous QHS  . insulin aspart  7 Units Subcutaneous TID WC  . insulin glargine  40 Units Subcutaneous Daily  . ipratropium-albuterol  3 mL Nebulization QID  . methylPREDNISolone (SOLU-MEDROL) injection  40 mg Intravenous Q12H  . pantoprazole  40 mg Oral BID AC  . pregabalin  50 mg Oral BID  . torsemide  20 mg Oral Daily   Continuous Infusions:   LOS: 2 days    Time spent: 35    Mary Lame, MD [facility]   If 7PM-7AM, please contact night-coverage [email or web address] Password [email or web address] 05/14/2018, 11:08 AM

## 2018-05-15 LAB — BASIC METABOLIC PANEL WITH GFR
Anion gap: 10 (ref 5–15)
CO2: 36 mmol/L — ABNORMAL HIGH (ref 22–32)
Chloride: 87 mmol/L — ABNORMAL LOW (ref 101–111)
GFR calc Af Amer: >60 mL/min (ref >60)
Sodium: 133 mmol/L — ABNORMAL LOW (ref 135–145)

## 2018-05-15 LAB — GLUCOSE, CAPILLARY
GLUCOSE-CAPILLARY: 465 mg/dL — AB (ref 65–99)
GLUCOSE-CAPILLARY: 289 mg/dL — AB (ref 65–99)
Glucose-Capillary: 270 mg/dL — ABNORMAL HIGH (ref 65–99)

## 2018-05-15 LAB — BASIC METABOLIC PANEL
BUN: 14 mg/dL (ref 6–20)
Calcium: 9 mg/dL (ref 8.9–10.3)
Creatinine, Ser: 0.49 mg/dL (ref 0.44–1.00)
GFR calc non Af Amer: >60 mL/min (ref >60)
Glucose, Bld: 308 mg/dL — ABNORMAL HIGH (ref 65–99)
Potassium: 4.2 mmol/L (ref 3.5–5.1)

## 2018-05-15 MED ORDER — INSULIN ASPART 100 UNIT/ML ~~LOC~~ SOLN: 0.0000 [IU] | Freq: Three times a day (TID) | SUBCUTANEOUS | 11 refills | Status: DC

## 2018-05-15 MED ORDER — PREDNISONE 50 MG PO TABS: 50.0000 mg | ORAL_TABLET | Freq: Every day | ORAL | 0 refills | Status: DC

## 2018-05-15 MED ORDER — HYDROCOD POLST-CPM POLST ER 10-8 MG/5ML PO SUER: 5.0000 mL | Freq: Two times a day (BID) | ORAL | 0 refills | Status: DC

## 2018-05-15 MED ORDER — INSULIN ASPART 100 UNIT/ML ~~LOC~~ SOLN: 15.0000 [IU] | Freq: Three times a day (TID) | SUBCUTANEOUS | 11 refills | Status: DC

## 2018-05-15 NOTE — Discharge Summary (Signed)
Physician Discharge Summary  Mary [name] ZOX:[id number] DOB: 19-Sep-1976 DOA: 05/12/2018  PCP: Corwin Levins, MD  Admit date: 05/12/2018 Discharge date: 05/15/2018  Admitted From: Home Disposition: Home  Recommendations for Outpatient Follow-up:  1. Follow up with PCP in 1-2 weeks 2. Please obtain BMP/CBC in one week 3. Please check your blood sugar 3 times a day with meals and at night while you are taking the steroids.  Please give herself insulin while you are taking steroids and for approximately 1 week afterwards.  Home Health: No Equipment/Devices: 3 L nasal cannula  Discharge Condition: Stable CODE STATUS: Full Diet recommendation: Carb modified  Brief/Interim Summary:  #) COPD exacerbation: Patient was admitted with acute hypercarbic respiratory failure due to COPD exacerbation.  She was continued on her home ICS/LABA/L AMA.  She was given bronchodilators scheduled and PRN.  She was given IV methylprednisolone and was discharged home on oral steroids.  #) Type 2 diabetes secondary to chronic steroid use: Patient was maintained on 50 units of glargine nightly and 15 units of aspart with meals.  She normally at home does a sliding scale when she is on steroids.  She was told to resume the sliding scale on discharge.  #) Obstructive sleep apnea: Patient was continued on home BiPAP settings.  #) Chronic diastolic heart failure: Patient was continued on home diuretics  #) Diabetic neuropathy: Patient was continued on home gabapentin and pregabalin.  At this time it is unclear why patient is on double gabapentinoids.  Will defer to outpatient PCP.  Discharge Diagnoses:  Principal Problem:   COPD exacerbation (HCC) Active Problems:   OSA on BiPAP   Obesity hypoventilation syndrome (HCC)   Chronic diastolic heart failure (HCC)   Diabetes mellitus type 2 in obese (HCC)   Diabetic neuropathy (HCC)   Chronic respiratory failure with hypoxia and hypercapnia  [facility])    Discharge Instructions  Discharge Instructions    Call MD for:  difficulty breathing, headache or visual disturbances   Complete by:  As directed    Call MD for:  extreme fatigue   Complete by:  As directed    Call MD for:  hives   Complete by:  As directed    Call MD for:  persistant dizziness or light-headedness   Complete by:  As directed    Call MD for:  persistant nausea and vomiting   Complete by:  As directed    Call MD for:  redness, tenderness, or signs of infection (pain, swelling, redness, odor or green/yellow discharge around incision site)   Complete by:  As directed    Call MD for:  severe uncontrolled pain   Complete by:  As directed    Call MD for:  temperature >100.4   Complete by:  As directed    Diet - low sodium heart healthy   Complete by:  As directed    Discharge instructions   Complete by:  As directed    While you are taking steroids please check your blood sugars 3 times a day with meals and at night.   Increase activity slowly   Complete by:  As directed      Allergies as of 05/15/2018      Reactions   Bee Venom Swelling   Pt states she swells up 3x normal size. If stung "2-3 times, my throat could close up"      Medication List    STOP taking these medications   predniSONE 10 MG (21) Tbpk  tablet Commonly known as:  STERAPRED UNI-PAK 21 TAB Replaced by:  predniSONE 50 MG tablet     TAKE these medications   albuterol 108 (90 Base) MCG/ACT inhaler Commonly known as:  VENTOLIN HFA USE 2 PUFFS EVERY 6 HOURS AS NEEDED FOR WHEEZING   amoxicillin-clavulanate 875-125 MG tablet Commonly known as:  AUGMENTIN Take 1 tablet by mouth 2 (two) times [name] for 14 days.   BASAGLAR KWIKPEN 100 UNIT/ML Sopn Inject 0.25 mLs (25 Units total) into the skin [name] at 10 pm.   budesonide-formoterol 160-4.5 MCG/ACT inhaler Commonly known as:  SYMBICORT Inhale 2 puffs into the lungs [name].   chlorpheniramine-HYDROcodone 10-8 MG/5ML Suer Commonly  known as:  TUSSIONEX Take 5 mLs by mouth every 12 (twelve) hours.   gabapentin 300 MG capsule Commonly known as:  NEURONTIN TAKE 2 CAPSULES (600 MG TOTAL) BY MOUTH 3 (THREE) TIMES [name].   insulin aspart 100 UNIT/ML injection Commonly known as:  novoLOG Inject 0-15 Units into the skin 4 (four) times [name] -  before meals and at bedtime.   insulin aspart 100 UNIT/ML injection Commonly known as:  novoLOG Inject 15 Units into the skin 3 (three) times [name] with meals.   insulin lispro 100 UNIT/ML injection Commonly known as:  HUMALOG Inject 0-0.15 mLs (0-15 Units total) into the skin as needed. If BS >200 use 8 units  CBG < 70: Eat or drink something right away and recheck. CBG 70 - 120: 0 units CBG 121 - 150: 2 units CBG 151 - 200: 3 units CBG 201 - 250: 5 units CBG 251 - 300: 8 units CBG 301 - 350: 11 units CBG 351 - 400: 15 units CBG > 400: call MD.   pantoprazole 40 MG tablet Commonly known as:  PROTONIX Take 1 tablet (40 mg total) by mouth 2 (two) times [name] before a meal.   predniSONE 50 MG tablet Commonly known as:  DELTASONE Take 1 tablet (50 mg total) by mouth [name] with breakfast. Replaces:  predniSONE 10 MG (21) Tbpk tablet   pregabalin 50 MG capsule Commonly known as:  LYRICA Take 1 capsule (50 mg total) by mouth 2 (two) times [name].   saccharomyces boulardii 250 MG capsule Commonly known as:  FLORASTOR Take 1 capsule (250 mg total) by mouth 2 (two) times [name].   SPIRIVA RESPIMAT 1.25 MCG/ACT Aers Generic drug:  Tiotropium Bromide Monohydrate INHALE 2 PUFFS INTO THE LUNGS [name].   torsemide 20 MG tablet Commonly known as:  DEMADEX TAKE 1 TABLET BY MOUTH EVERY DAY   traMADol 50 MG tablet Commonly known as:  ULTRAM TAKE 1 TABLET BY MOUTH EVERY 8 HOURS AS NEEDED FOR SEVERE PAIN       Allergies  Allergen Reactions  . Bee Venom Swelling    Pt states she swells up 3x normal size. If stung "2-3 times, my throat could close up"     Consultations:  None   Procedures/Studies: Dg Chest 2 View  Result Date: 05/12/2018 CLINICAL DATA:  Shortness of breath EXAM: CHEST - 2 VIEW COMPARISON:  May 03, 2018 chest radiograph and chest CT February 08, 2018 FINDINGS: There is mild scarring in the left base region. There is no appreciable edema or consolidation. Heart is upper normal in size with pulmonary vascularity within normal limits. No adenopathy. No evident bone lesions. IMPRESSION: Scarring left base. No edema or consolidation. Stable cardiac silhouette. Electronically Signed   By: Bretta Bang III M.D.   On: 05/12/2018 08:25   Dg Chest  Portable 1 View  Result Date: 05/03/2018 CLINICAL DATA:  Patient reports a productive cough and burning chest sensation which began yesterday and has worsened. History of asthma-COPD, CHF, and pulmonary hypertension. EXAM: PORTABLE CHEST 1 VIEW COMPARISON:  Chest x-ray of March 05/25/2018 and chest CT scan of the same day FINDINGS: The lungs are mildly hyperinflated. There are patchy airspace opacities obscuring the left cardiac apex. There is no pleural effusion. The heart is top-normal in size. The pulmonary vascularity is not engorged. The mediastinum is normal in width. The observed bony thorax is normal. IMPRESSION: Hyperinflation consistent with COPD-reactive airway disease. Atelectasis or pneumonia in the lingula. Followup PA and lateral chest X-ray is recommended in 3-4 weeks following trial of antibiotic therapy to ensure resolution and exclude underlying malignancy. Electronically Signed   By: David  [place] M.D.   On: 05/03/2018 10:16      Subjective:   Discharge Exam: Vitals:   05/15/18 0457 05/15/18 0849  BP: 108/77   Pulse: (!) 58   Resp: 14   Temp: 97.7 F (36.5 C)   SpO2: 97% 95%   Vitals:   05/14/18 2015 05/14/18 2104 05/15/18 0457 05/15/18 0849  BP:  (!) 106/59 108/77   Pulse:  65 (!) 58   Resp:  (!) 21 14   Temp:  97.8 F (36.6 C) 97.7 F (36.5 C)    TempSrc:  Oral Oral   SpO2: 98% 99% 97% 95%  Weight:      Height:         General exam: No acute distress Respiratory system: No increased work of breathing, scattered rhonchi, no wheezing or crackles Cardiovascular system: Distant heart sounds, regular rate and rhythm, no murmurs Gastrointestinal system: Abdomen is nondistended, soft and nontender. No organomegaly or masses felt. Normal bowel sounds heard. Central nervous system: Alert and oriented. No focal neurological deficits. Extremities: Trace lower extremity edema Skin: Friable skin, numerous contusions noted Psychiatry: Judgement and insight appear normal. Mood & affect appropriate.       The results of significant diagnostics from this hospitalization (including imaging, microbiology, ancillary and laboratory) are listed below for reference.     Microbiology: Recent Results (from the past 240 hour(s))  Blood Culture (routine x 2)     Status: None (Preliminary result)   Collection Time: 05/12/18  7:53 AM  Result Value Ref Range Status   Specimen Description   Final    BLOOD LEFT HAND Performed at [facility], [address] [place], [place] [address]    Special Requests   Final    BOTTLES DRAWN AEROBIC AND ANAEROBIC Blood Culture adequate volume Performed at [facility], [address], [place], [place] [address]    Culture   Final    NO GROWTH 2 DAYS Performed at [facility], [address] [place], [place] [address]    Report Status PENDING  Incomplete  Blood Culture (routine x 2)     Status: None (Preliminary result)   Collection Time: 05/12/18  8:30 AM  Result Value Ref Range Status   Specimen Description   Final    BLOOD LEFT ANTECUBITAL Performed at [facility], [address], [place], [place] [address]    Special Requests   Final    BOTTLES DRAWN AEROBIC AND ANAEROBIC Blood Culture adequate volume Performed at [facility], [address] [place], [place] [address]    Culture   Final    NO GROWTH 2  DAYS Performed at [facility], [address] [place], [place] [address]    Report Status PENDING  Incomplete     Labs: BNP (last 3 results) Recent Labs    01/11/18 0526 02/08/18 1616 05/12/18 0754  BNP 45.9 40.9 64.2   Basic Metabolic Panel: Recent Labs  Lab 05/12/18 0753 05/12/18 0754 05/13/18 0342 05/14/18 0426 05/15/18 0452  NA 136  --  137 133* 133*  K 3.8  --  4.4 4.5 4.2  CL 90*  --  88* 88* 87*  CO2 35*  --  38* 37* 36*  GLUCOSE 265*  --  384* 358* 308*  BUN 14  --  19 17 14   CREATININE 0.52  --  0.79 0.44 0.49  CALCIUM 8.9  --  9.5 9.4 9.0  MG  --  1.7  --  1.9  --    Liver Function Tests: Recent Labs  Lab 05/12/18 0753  AST 82*  ALT 123*  ALKPHOS 114  BILITOT 1.6*  PROT 7.5  ALBUMIN 3.8   No results for input(s): LIPASE, AMYLASE in the last 168 hours. No results for input(s): AMMONIA in the last 168 hours. CBC: Recent Labs  Lab 05/12/18 0753 05/13/18 0342 05/14/18 0426  WBC 9.8 8.1 12.0*  NEUTROABS 8.0*  --   --   HGB 14.5 13.8 14.2  HCT 43.9 42.6 43.1  MCV 103.8* 102.4* 101.7*  PLT 112* 107* 120*   Cardiac Enzymes: No results for input(s): CKTOTAL, CKMB, CKMBINDEX, TROPONINI in the last 168 hours. BNP: Invalid input(s): POCBNP CBG: Recent Labs  Lab 05/14/18 0738 05/14/18 1139 05/14/18 1728 05/14/18 2102 05/15/18 0756  GLUCAP 405* 353* 239* 366* 465*   D-Dimer No results for input(s): DDIMER in the last 72 hours. Hgb A1c No results for input(s): HGBA1C in the last 72 hours. Lipid Profile No results for input(s): CHOL, HDL, LDLCALC, TRIG, CHOLHDL, LDLDIRECT in the last 72 hours. Thyroid function studies No results for input(s): TSH, T4TOTAL, T3FREE, THYROIDAB in the last 72 hours.  Invalid input(s): FREET3 Anemia work up No results for input(s): VITAMINB12, FOLATE, FERRITIN, TIBC, IRON, RETICCTPCT in the last 72  hours. Urinalysis    Component Value Date/Time   COLORURINE YELLOW 05/12/2018 1112   APPEARANCEUR HAZY (A) 05/12/2018 1112   LABSPEC 1.030 05/12/2018 1112   PHURINE 6.0 05/12/2018 1112   GLUCOSEU >=500 (A) 05/12/2018 1112   GLUCOSEU 100 (A) 02/24/2018 1644   HGBUR NEGATIVE 05/12/2018 1112   BILIRUBINUR NEGATIVE 05/12/2018 1112   KETONESUR 5 (A) 05/12/2018 1112   PROTEINUR NEGATIVE 05/12/2018 1112   UROBILINOGEN 2.0 (A) 02/24/2018 1644   NITRITE NEGATIVE 05/12/2018 1112   LEUKOCYTESUR MODERATE (A) 05/12/2018 1112   Sepsis Labs Invalid input(s): PROCALCITONIN,  WBC,  LACTICIDVEN Microbiology Recent Results (from the past 240 hour(s))  Blood Culture (routine x 2)     Status: None (Preliminary result)   Collection Time: 05/12/18  7:53 AM  Result Value Ref Range Status   Specimen Description   Final    BLOOD LEFT HAND Performed at [facility], [address], [place], [place] [address]    Special Requests   Final    BOTTLES DRAWN AEROBIC AND ANAEROBIC Blood Culture adequate volume Performed at [facility], [address], [place], [place] [address]    Culture   Final    NO GROWTH 2 DAYS Performed at [facility], [address], [place], [place] [address]    Report  Status PENDING  Incomplete  Blood Culture (routine x 2)     Status: None (Preliminary result)   Collection Time: 05/12/18  8:30 AM  Result Value Ref Range Status   Specimen Description   Final    BLOOD LEFT ANTECUBITAL Performed at [facility], [address] [place], [place] [address]    Special Requests   Final    BOTTLES DRAWN AEROBIC AND ANAEROBIC Blood Culture adequate volume Performed at [facility], [address] [place], [place] [address]    Culture   Final    NO GROWTH 2 DAYS Performed at [facility], [address] [place], [place] [address]    Report Status PENDING  Incomplete     Time coordinating  discharge: Over 30 minutes  SIGNED:   Delaine LameShrey C Channing Savich, MD  [facility] 05/15/2018, 10:31 AM  If 7PM-7AM, please contact night-coverage [email or web address] Password [email or web address]

## 2018-05-15 NOTE — Discharge Instructions (Signed)
Prednisone tablets °What is this medicine? °PREDNISONE (PRED ni sone) is a corticosteroid. It is commonly used to treat inflammation of the skin, joints, lungs, and other organs. Common conditions treated include asthma, allergies, and arthritis. It is also used for other conditions, such as blood disorders and diseases of the adrenal glands. °This medicine may be used for other purposes; ask your health care provider or pharmacist if you have questions. °COMMON BRAND NAME(S): Deltasone, Predone, Sterapred, Sterapred DS °What should I tell my health care provider before I take this medicine? °They need to know if you have any of these conditions: °-Cushing's syndrome °-diabetes °-glaucoma °-heart disease °-high blood pressure °-infection (especially a virus infection such as chickenpox, cold sores, or herpes) °-kidney disease °-liver disease °-mental illness °-myasthenia gravis °-osteoporosis °-seizures °-stomach or intestine problems °-thyroid disease °-an unusual or allergic reaction to lactose, prednisone, other medicines, foods, dyes, or preservatives °-pregnant or trying to get pregnant °-breast-feeding °How should I use this medicine? °Take this medicine by mouth with a glass of water. Follow the directions on the prescription label. Take this medicine with food. If you are taking this medicine once a day, take it in the morning. Do not take more medicine than you are told to take. Do not suddenly stop taking your medicine because you may develop a severe reaction. Your doctor will tell you how much medicine to take. If your doctor wants you to stop the medicine, the dose may be slowly lowered over time to avoid any side effects. °Talk to your pediatrician regarding the use of this medicine in children. Special care may be needed. °Overdosage: If you think you have taken too much of this medicine contact a poison control center or emergency room at once. °NOTE: This medicine is only for you. Do not share this  medicine with others. °What if I miss a dose? °If you miss a dose, take it as soon as you can. If it is almost time for your next dose, talk to your doctor or health care professional. You may need to miss a dose or take an extra dose. Do not take double or extra doses without advice. °What may interact with this medicine? °Do not take this medicine with any of the following medications: °-metyrapone °-mifepristone °This medicine may also interact with the following medications: °-aminoglutethimide °-amphotericin B °-aspirin and aspirin-like medicines °-barbiturates °-certain medicines for diabetes, like glipizide or glyburide °-cholestyramine °-cholinesterase inhibitors °-cyclosporine °-digoxin °-diuretics °-ephedrine °-female hormones, like estrogens and birth control pills °-isoniazid °-ketoconazole °-NSAIDS, medicines for pain and inflammation, like ibuprofen or naproxen °-phenytoin °-rifampin °-toxoids °-vaccines °-warfarin °This list may not describe all possible interactions. Give your health care provider a list of all the medicines, herbs, non-prescription drugs, or dietary supplements you use. Also tell them if you smoke, drink alcohol, or use illegal drugs. Some items may interact with your medicine. °What should I watch for while using this medicine? °Visit your doctor or health care professional for regular checks on your progress. If you are taking this medicine over a prolonged period, carry an identification card with your name and address, the type and dose of your medicine, and your doctor's name and address. °This medicine may increase your risk of getting an infection. Tell your doctor or health care professional if you are around anyone with measles or chickenpox, or if you develop sores or blisters that do not heal properly. °If you are going to have surgery, tell your doctor or health care professional that   you have taken this medicine within the last twelve months. °Ask your doctor or health  care professional about your diet. You may need to lower the amount of salt you eat. °This medicine may affect blood sugar levels. If you have diabetes, check with your doctor or health care professional before you change your diet or the dose of your diabetic medicine. °What side effects may I notice from receiving this medicine? °Side effects that you should report to your doctor or health care professional as soon as possible: °-allergic reactions like skin rash, itching or hives, swelling of the face, lips, or tongue °-changes in emotions or moods °-changes in vision °-depressed mood °-eye pain °-fever or chills, cough, sore throat, pain or difficulty passing urine °-increased thirst °-swelling of ankles, feet °Side effects that usually do not require medical attention (report to your doctor or health care professional if they continue or are bothersome): °-confusion, excitement, restlessness °-headache °-nausea, vomiting °-skin problems, acne, thin and shiny skin °-trouble sleeping °-weight gain °This list may not describe all possible side effects. Call your doctor for medical advice about side effects. You may report side effects to FDA at 1-800-FDA-1088. °Where should I keep my medicine? °Keep out of the reach of children. °Store at room temperature between 15 and 30 degrees C (59 and 86 degrees F). Protect from light. Keep container tightly closed. Throw away any unused medicine after the expiration date. °NOTE: This sheet is a summary. It may not cover all possible information. If you have questions about this medicine, talk to your doctor, pharmacist, or health care provider. °© 2018 [employer]vier/Gold Standard (2011-07-08 10:57:14) ° °

## 2018-05-15 NOTE — Progress Notes (Signed)
Inpatient Diabetes Program Recommendations  AACE/ADA: New Consensus Statement on Inpatient Glycemic Control (2015)  Target Ranges:  Prepandial:   less than 140 mg/dL      Peak postprandial:   less than 180 mg/dL (1-2 hours)      Critically ill patients:  140 - 180 mg/dL   Results for Mary [name], Antoinetta L (MRN [id number]) as of 05/15/2018 10:06  Ref. Range 05/14/2018 07:38 05/14/2018 11:39 05/14/2018 17:28 05/14/2018 21:02 05/15/2018 07:56  Glucose-Capillary Latest Ref Range: 65 - 99 mg/dL 409405 (H) 811353 (H) 914239 (H) 366 (H) 465 (H)   Review of Glycemic Control  Diabetes history: DM 2 Outpatient Diabetes medications: Basaglar 25 units qhs, Humalog 0-15 units SSI Current orders for Inpatient glycemic control: Lantus 50 units (received this am), Novolog Resistant Correction 0-20 units tid + Novolog HS scale 0-5 units, Novolog 15 units tid meal coverage  Inpatient Diabetes Program Recommendations:    Glucose consistently in the 300-400 range on IV Solumedrol 40 mg Q12 hours. If steroid dose stays the same. Consider adding Lantus 15 units QHS in addition to daytime dose, Also consider increasing meal coverage to 20 units tid if patient is consuming at least 50% of meals.  Thanks,  Christena DeemShannon Johnmatthew Solorio RN, MSN, BC-ADM, [facility] Inpatient Diabetes Coordinator Team Pager [phone] (8a-5p)

## 2018-05-15 NOTE — Progress Notes (Signed)
Patient discharged to home, all discharge medications and instructions reviewed and questions answered.  Patient to be assisted to vehicle by wheelchair.  

## 2018-05-16 ENCOUNTER — Telehealth: Payer: Self-pay

## 2018-05-16 NOTE — Telephone Encounter (Signed)
Pt is on TCM call. DC'ed on 05/15/2018 after admission due to COPD exacerbation.   Called number listed for pt. Automated message stated that pt has call restrictions and was unable to leave a message.

## 2018-05-17 LAB — CULTURE, BLOOD (ROUTINE X 2)
CULTURE: NO GROWTH
CULTURE: NO GROWTH
SPECIAL REQUESTS: ADEQUATE
Special Requests: ADEQUATE

## 2018-05-18 ENCOUNTER — Telehealth: Payer: Self-pay | Admitting: Internal Medicine

## 2018-05-18 NOTE — Telephone Encounter (Signed)
Pt requesting a refill of cough medication to bridge her until Sat . She states she was recently released from hospital and needs something for cough   [facility] [address] that cannot be filled until this Sat as it would be too early , the rx was written by Dr Arlyn LeakShrey Purohit   On 05/15/2018.  [facility] [address] stated a rx for Tussionex was written earlier   On 09 May 2018 by Dr  Luberta MutterKonidena for 115 ml 1 tsp bid prn   Last visit in office 02/24/2018 with Dr Jonny RuizJohn

## 2018-05-18 NOTE — Telephone Encounter (Unsigned)
Copied from CRM [phone]#[id number]. Topic: Quick Communication - See Telephone Encounter >> May 18, 2018  1:33 PM Waymon AmatoBurton, Donna F wrote: Pt states that she has a cough medication  on file at pharmacy that she can not get filled until Saturday but she really needs something to help with cough until she get the Saturday rx  Best number is [phone]  she just got out of the hospital

## 2018-05-19 NOTE — Telephone Encounter (Signed)
Very sorry, I am not able to refill controlled substances early, even cough medicines

## 2018-05-22 ENCOUNTER — Other Ambulatory Visit: Payer: Self-pay | Admitting: Family

## 2018-05-22 DIAGNOSIS — E1169 Type 2 diabetes mellitus with other specified complication: Secondary | ICD-10-CM

## 2018-05-22 DIAGNOSIS — E669 Obesity, unspecified: Principal | ICD-10-CM

## 2018-05-24 ENCOUNTER — Ambulatory Visit (INDEPENDENT_AMBULATORY_CARE_PROVIDER_SITE_OTHER): Payer: 59 | Admitting: Internal Medicine

## 2018-05-24 ENCOUNTER — Encounter: Payer: Self-pay | Admitting: Internal Medicine

## 2018-05-24 ENCOUNTER — Other Ambulatory Visit: Payer: Self-pay | Admitting: Internal Medicine

## 2018-05-24 VITALS — BP 124/78 | HR 100 | Temp 98.4°F | Ht 62.0 in | Wt 200.0 lb

## 2018-05-24 DIAGNOSIS — I5032 Chronic diastolic (congestive) heart failure: Secondary | ICD-10-CM

## 2018-05-24 DIAGNOSIS — J441 Chronic obstructive pulmonary disease with (acute) exacerbation: Secondary | ICD-10-CM | POA: Diagnosis not present

## 2018-05-24 DIAGNOSIS — E1169 Type 2 diabetes mellitus with other specified complication: Secondary | ICD-10-CM | POA: Diagnosis not present

## 2018-05-24 DIAGNOSIS — E669 Obesity, unspecified: Secondary | ICD-10-CM

## 2018-05-24 MED ORDER — GLUCOSE BLOOD VI STRP: ORAL_STRIP | 12 refills | Status: DC

## 2018-05-24 MED ORDER — INSULIN ASPART 100 UNIT/ML FLEXPEN: PEN_INJECTOR | SUBCUTANEOUS | 11 refills | Status: DC

## 2018-05-24 MED ORDER — GUAIFENESIN-CODEINE 100-10 MG/5ML PO SOLN: 5.0000 mL | Freq: Four times a day (QID) | ORAL | 0 refills | Status: DC | PRN

## 2018-05-24 MED ORDER — BASAGLAR KWIKPEN 100 UNIT/ML ~~LOC~~ SOPN: 25.0000 [IU] | PEN_INJECTOR | Freq: Every day | SUBCUTANEOUS | 11 refills | Status: DC

## 2018-05-24 MED ORDER — INSULIN PEN NEEDLE 32G X 4 MM MISC: 3 refills | Status: AC

## 2018-05-24 NOTE — Assessment & Plan Note (Signed)
stable overall by history and exam, and pt to continue medical treatment as before,  to f/u any worsening symptoms or concerns 

## 2018-05-24 NOTE — Assessment & Plan Note (Signed)
Slower improvement than usual, to finish antibx, steroid tx, cont all inhaler

## 2018-05-24 NOTE — Progress Notes (Signed)
Subjective:    Patient ID: Mary [name], female    DOB: Jan 05, 1976, 42 y.o.   MRN: [id number]  HPI  Here to f/u post hospn with copd exacerbation with steroid induced hyperglycemia, OSA on home bipap, home o2 3L, and diast CHF (the latter stable).  Here to f/u; overall doing ok,  Pt denies chest pain, increasing sob or doe, wheezing, orthopnea, PND, increased LE swelling, palpitations, dizziness or syncope, but is getting slowly better than usual, still has significant DOE with walking room to room.  Pt denies new neurological symptoms such as new headache, or facial or extremity weakness or numbness.  Pt denies polydipsia, polyuria, or low sugar episode.  Pt states overall good compliance with meds, mostly trying to follow appropriate diet, with wt overall stable,  but little exercise however. Sugars have been about 200, taking insulin on steroid tx but need pens and needles and supplies.  No other new compalints Past Medical History:  Diagnosis Date  . Asthma   . Axillary adenopathy    right axillary adenopathy noted on CT chest (03/06/2012)  . Chronic right-sided CHF (congestive heart failure) (HCC) 03/23/2012   with cor pulmonale. Last RHC 03/2012  . COPD (chronic obstructive pulmonary disease) (HCC)    PFTS 03/08/12: fev1 0.58L/1%, FVC 1.18/33%, Ratop 49 and c/w ssevere obstruction. 21% BD response on FVC, RV 219%, DLCO 11/54%  . Diabetes mellitus without complication (HCC)   . DVT of upper extremity (deep vein thrombosis) [facility]) April 2013   right subclavian // Unclear precipitating cause - possibly significant right heart failure, with vascular stasis potentially predisposing to clotting.    . Exertional shortness of breath   . Hepatic cirrhosis (HCC)    Questionable history of - Noted on CT abdomen (03/2012) - thought to be due to vascular congestion from right heart failure +/- patient's history of alcohol abuse  . History of alcoholism (HCC)   . History of chronic bronchitis   .  Irregular menses   . Migraine    "~ 1/yr" (05/03/2013)  . Moderate to severe pulmonary hypertension [facility]) April 2013   Cardiac cath on 03/06/12 - 1. Elevated pulmonary artery pressures, right sided filling pressures.,  2. PA: 64/45 (mean 53)    . On home oxygen therapy    "2-3 L 24/7" (05/03/2013)  . OSA (obstructive sleep apnea)    wears noctural BiPAP (05/03/2013)  . Periodontal disease   . Pneumonia ~ 1985; 01/2011  . Pulmonary embolus [facility]) April 2013   Precipitating cause unclear. Was treated with coumadin from April-June 2013.  . Pulmonary hypertension (HCC)    Past Surgical History:  Procedure Laterality Date  . APPENDECTOMY  05/03/2013  . CARDIAC CATHETERIZATION  03/2012  . CARDIAC CATHETERIZATION N/A 01/08/2016   Procedure: Right Heart Cath;  Surgeon: Dolores Patty, MD;  Location: [facility] INVASIVE CV LAB;  Service: Cardiovascular;  Laterality: N/A;  . CARDIAC SURGERY  07-04-1976   "my heart was backwards" (05/03/2013)  . LAPAROSCOPIC APPENDECTOMY N/A 05/03/2013   Procedure: APPENDECTOMY LAPAROSCOPIC;  Surgeon: Cherylynn Ridges, MD;  Location: [facility] OR;  Service: General;  Laterality: N/A;  . LUNG SURGERY  1976-03-17  . RIGHT HEART CATHETERIZATION N/A 03/07/2012   Procedure: RIGHT HEART CATH;  Surgeon: Kathleene Hazel, MD;  Location[facility] CATH LAB;  Service: Cardiovascular;  Laterality: N/A;    reports that she has been smoking cigarettes.  She has a 6.25 pack-year smoking history. She has never used smokeless tobacco. She reports  that she drank alcohol. She reports that she does not use drugs. family history includes Alcohol abuse in her father; Heart disease in her father; Hypertension in her father; Other in her brother. Allergies  Allergen Reactions  . Bee Venom Swelling    Pt states she swells up 3x normal size. If stung "2-3 times, my throat could close up"   Current Outpatient Medications on File Prior to Visit  Medication Sig Dispense Refill  . albuterol (VENTOLIN HFA) 108 (90  Base) MCG/ACT inhaler USE 2 PUFFS EVERY 6 HOURS AS NEEDED FOR WHEEZING 1 Inhaler 3  . budesonide-formoterol (SYMBICORT) 160-4.5 MCG/ACT inhaler Inhale 2 puffs into the lungs daily. 1 Inhaler 09  . gabapentin (NEURONTIN) 300 MG capsule TAKE 2 CAPSULES (600 MG TOTAL) BY MOUTH 3 (THREE) TIMES DAILY. 540 capsule 1  . pantoprazole (PROTONIX) 40 MG tablet Take 1 tablet (40 mg total) by mouth 2 (two) times daily before a meal. 60 tablet 0  . predniSONE (DELTASONE) 50 MG tablet Take 1 tablet (50 mg total) by mouth daily with breakfast. 10 tablet 0  . pregabalin (LYRICA) 50 MG capsule Take 1 capsule (50 mg total) by mouth 2 (two) times daily. 60 capsule 3  . saccharomyces boulardii (FLORASTOR) 250 MG capsule Take 1 capsule (250 mg total) by mouth 2 (two) times daily. 30 capsule 0  . SPIRIVA RESPIMAT 1.25 MCG/ACT AERS INHALE 2 PUFFS INTO THE LUNGS DAILY. 1 Inhaler 5  . torsemide (DEMADEX) 20 MG tablet TAKE 1 TABLET BY MOUTH EVERY DAY 90 tablet 0  . traMADol (ULTRAM) 50 MG tablet TAKE 1 TABLET BY MOUTH EVERY 8 HOURS AS NEEDED FOR SEVERE PAIN 30 tablet 3   No current facility-administered medications on file prior to visit.    Review of Systems  Constitutional: Negative for other unusual diaphoresis or sweats HENT: Negative for ear discharge or swelling Eyes: Negative for other worsening visual disturbances Respiratory: Negative for stridor or other swelling  Gastrointestinal: Negative for worsening distension or other blood Genitourinary: Negative for retention or other urinary change Musculoskeletal: Negative for other MSK pain or swelling Skin: Negative for color change or other new lesions Neurological: Negative for worsening tremors and other numbness  Psychiatric/Behavioral: Negative for worsening agitation or other fatigue All other system neg per pt    Objective:   Physical Exam BP 124/78   Pulse 100   Temp 98.4 F (36.9 C) (Oral)   Ht 5\' 2"  (1.575 m)   Wt 200 lb (90.7 kg)   LMP  04/24/2018 (Approximate)   SpO2 95%   BMI 36.58 kg/m , on 3L [place] home o2 VS noted,  Constitutional: Pt appears in NAD HENT: Head: NCAT.  Right Ear: External ear normal.  Left Ear: External ear normal.  Eyes: . Pupils are equal, round, and reactive to light. Conjunctivae and EOM are normal Nose: without d/c or deformity Neck: Neck supple. Gross normal ROM Cardiovascular: Normal rate and regular rhythm.   Pulmonary/Chest: Effort normal and breath sounds decreased without rales or wheezing.  Abd:  Soft, NT, ND, + BS, no organomegaly Neurological: Pt is alert. At baseline orientation, motor grossly intact Skin: Skin is warm. No rashes, other new lesions, no LE edema Psychiatric: Pt behavior is normal without agitation  No other exam findings Lab Results  Component Value Date   WBC 12.0 (H) 05/14/2018   HGB 14.2 05/14/2018   HCT 43.1 05/14/2018   PLT 120 (L) 05/14/2018   GLUCOSE 308 (H) 05/15/2018   CHOL 232 (  H) 02/24/2018   TRIG 131.0 02/24/2018   HDL 44.40 02/24/2018   LDLCALC 162 (H) 02/24/2018   ALT 123 (H) 05/12/2018   AST 82 (H) 05/12/2018   NA 133 (L) 05/15/2018   K 4.2 05/15/2018   CL 87 (L) 05/15/2018   CREATININE 0.49 05/15/2018   BUN 14 05/15/2018   CO2 36 (H) 05/15/2018   TSH 3.49 02/24/2018   INR 0.92 02/09/2017   HGBA1C 6.7 (H) 02/24/2018   MICROALBUR 1.4 02/24/2018          Assessment & Plan:

## 2018-05-24 NOTE — Assessment & Plan Note (Signed)
meds adjusted to pen form with needles, also supplies sent to cont check cbg's qid especially on steroid tx, o/w stable overall by history and exam, recent data reviewed with pt, and pt to continue medical treatment as before,  to f/u any worsening symptoms or concerns

## 2018-05-24 NOTE — Patient Instructions (Addendum)
Please continue all other medications as before, and refills have been done if requested for the insulin in pen form with the needles, and the supplies to check your sugars  Please finish as you have planned with the antibiotic and steroid  Please have the pharmacy call with any other refills you may need.  Please continue your efforts at being more active, low cholesterol diet, and weight control.  Please keep your appointments with your specialists as you may have planned

## 2018-06-08 DIAGNOSIS — J449 Chronic obstructive pulmonary disease, unspecified: Secondary | ICD-10-CM | POA: Diagnosis not present

## 2018-06-08 DIAGNOSIS — I270 Primary pulmonary hypertension: Secondary | ICD-10-CM | POA: Diagnosis not present

## 2018-06-08 DIAGNOSIS — G4733 Obstructive sleep apnea (adult) (pediatric): Secondary | ICD-10-CM | POA: Diagnosis not present

## 2018-06-09 ENCOUNTER — Other Ambulatory Visit: Payer: Self-pay | Admitting: Internal Medicine

## 2018-06-09 NOTE — Telephone Encounter (Signed)
Requesting refill on proair > done

## 2018-06-10 DIAGNOSIS — G4733 Obstructive sleep apnea (adult) (pediatric): Secondary | ICD-10-CM | POA: Diagnosis not present

## 2018-06-10 DIAGNOSIS — I270 Primary pulmonary hypertension: Secondary | ICD-10-CM | POA: Diagnosis not present

## 2018-06-10 DIAGNOSIS — J449 Chronic obstructive pulmonary disease, unspecified: Secondary | ICD-10-CM | POA: Diagnosis not present

## 2018-06-14 ENCOUNTER — Other Ambulatory Visit: Payer: Self-pay | Admitting: Internal Medicine

## 2018-07-03 ENCOUNTER — Emergency Department (HOSPITAL_COMMUNITY): Payer: 59

## 2018-07-03 ENCOUNTER — Encounter (HOSPITAL_COMMUNITY): Payer: Self-pay | Admitting: Emergency Medicine

## 2018-07-03 ENCOUNTER — Observation Stay (HOSPITAL_COMMUNITY)
Admission: EM | Admit: 2018-07-03 | Discharge: 2018-07-06 | Disposition: A | Discharge status: Home / Self Care | Payer: 59 | Attending: Internal Medicine | Admitting: Internal Medicine

## 2018-07-03 ENCOUNTER — Other Ambulatory Visit: Payer: Self-pay

## 2018-07-03 DIAGNOSIS — I5032 Chronic diastolic (congestive) heart failure: Secondary | ICD-10-CM | POA: Insufficient documentation

## 2018-07-03 DIAGNOSIS — K7460 Unspecified cirrhosis of liver: Secondary | ICD-10-CM | POA: Diagnosis not present

## 2018-07-03 DIAGNOSIS — Z86711 Personal history of pulmonary embolism: Secondary | ICD-10-CM | POA: Insufficient documentation

## 2018-07-03 DIAGNOSIS — K056 Periodontal disease, unspecified: Secondary | ICD-10-CM | POA: Insufficient documentation

## 2018-07-03 DIAGNOSIS — R0600 Dyspnea, unspecified: Secondary | ICD-10-CM | POA: Diagnosis not present

## 2018-07-03 DIAGNOSIS — R000 Tachycardia, unspecified: Secondary | ICD-10-CM | POA: Diagnosis not present

## 2018-07-03 DIAGNOSIS — I272 Pulmonary hypertension, unspecified: Secondary | ICD-10-CM | POA: Diagnosis present

## 2018-07-03 DIAGNOSIS — Z8489 Family history of other specified conditions: Secondary | ICD-10-CM | POA: Insufficient documentation

## 2018-07-03 DIAGNOSIS — G43909 Migraine, unspecified, not intractable, without status migrainosus: Secondary | ICD-10-CM | POA: Insufficient documentation

## 2018-07-03 DIAGNOSIS — E099 Drug or chemical induced diabetes mellitus without complications: Secondary | ICD-10-CM

## 2018-07-03 DIAGNOSIS — R918 Other nonspecific abnormal finding of lung field: Secondary | ICD-10-CM | POA: Diagnosis not present

## 2018-07-03 DIAGNOSIS — Z91030 Bee allergy status: Secondary | ICD-10-CM | POA: Insufficient documentation

## 2018-07-03 DIAGNOSIS — E662 Morbid (severe) obesity with alveolar hypoventilation: Secondary | ICD-10-CM | POA: Diagnosis not present

## 2018-07-03 DIAGNOSIS — Z811 Family history of alcohol abuse and dependence: Secondary | ICD-10-CM | POA: Insufficient documentation

## 2018-07-03 DIAGNOSIS — R0602 Shortness of breath: Secondary | ICD-10-CM | POA: Diagnosis not present

## 2018-07-03 DIAGNOSIS — F17210 Nicotine dependence, cigarettes, uncomplicated: Secondary | ICD-10-CM | POA: Insufficient documentation

## 2018-07-03 DIAGNOSIS — R0902 Hypoxemia: Secondary | ICD-10-CM | POA: Diagnosis not present

## 2018-07-03 DIAGNOSIS — J9601 Acute respiratory failure with hypoxia: Secondary | ICD-10-CM | POA: Insufficient documentation

## 2018-07-03 DIAGNOSIS — G894 Chronic pain syndrome: Secondary | ICD-10-CM | POA: Insufficient documentation

## 2018-07-03 DIAGNOSIS — E1165 Type 2 diabetes mellitus with hyperglycemia: Secondary | ICD-10-CM | POA: Insufficient documentation

## 2018-07-03 DIAGNOSIS — I110 Hypertensive heart disease with heart failure: Secondary | ICD-10-CM | POA: Diagnosis not present

## 2018-07-03 DIAGNOSIS — Z8249 Family history of ischemic heart disease and other diseases of the circulatory system: Secondary | ICD-10-CM | POA: Insufficient documentation

## 2018-07-03 DIAGNOSIS — Z7951 Long term (current) use of inhaled steroids: Secondary | ICD-10-CM | POA: Insufficient documentation

## 2018-07-03 DIAGNOSIS — R05 Cough: Secondary | ICD-10-CM | POA: Insufficient documentation

## 2018-07-03 DIAGNOSIS — T380X5A Adverse effect of glucocorticoids and synthetic analogues, initial encounter: Secondary | ICD-10-CM | POA: Diagnosis not present

## 2018-07-03 DIAGNOSIS — Z86718 Personal history of other venous thrombosis and embolism: Secondary | ICD-10-CM | POA: Diagnosis not present

## 2018-07-03 DIAGNOSIS — N926 Irregular menstruation, unspecified: Secondary | ICD-10-CM | POA: Diagnosis not present

## 2018-07-03 DIAGNOSIS — Z79899 Other long term (current) drug therapy: Secondary | ICD-10-CM | POA: Insufficient documentation

## 2018-07-03 DIAGNOSIS — G4733 Obstructive sleep apnea (adult) (pediatric): Secondary | ICD-10-CM

## 2018-07-03 DIAGNOSIS — R069 Unspecified abnormalities of breathing: Secondary | ICD-10-CM | POA: Diagnosis not present

## 2018-07-03 DIAGNOSIS — Z9981 Dependence on supplemental oxygen: Secondary | ICD-10-CM | POA: Diagnosis not present

## 2018-07-03 DIAGNOSIS — Z794 Long term (current) use of insulin: Secondary | ICD-10-CM | POA: Insufficient documentation

## 2018-07-03 DIAGNOSIS — J441 Chronic obstructive pulmonary disease with (acute) exacerbation: Secondary | ICD-10-CM | POA: Diagnosis not present

## 2018-07-03 DIAGNOSIS — R062 Wheezing: Secondary | ICD-10-CM | POA: Diagnosis not present

## 2018-07-03 LAB — BASIC METABOLIC PANEL
Anion gap: 12 (ref 5–15)
BUN: 6 mg/dL (ref 6–20)
CALCIUM: 9.8 mg/dL (ref 8.9–10.3)
CHLORIDE: 89 mmol/L — AB (ref 98–111)
CO2: 35 mmol/L — ABNORMAL HIGH (ref 22–32)
CREATININE: 0.56 mg/dL (ref 0.44–1.00)
Glucose, Bld: 285 mg/dL — ABNORMAL HIGH (ref 70–99)
Potassium: 3.7 mmol/L (ref 3.5–5.1)
Sodium: 136 mmol/L (ref 135–145)

## 2018-07-03 LAB — CBC WITH DIFFERENTIAL/PLATELET
Basophils Absolute: 0 10*3/uL (ref 0.0–0.1)
Basophils Relative: 0 %
Eosinophils Absolute: 0 10*3/uL (ref 0.0–0.7)
Eosinophils Relative: 1 %
HCT: 42.4 % (ref 36.0–46.0)
HEMOGLOBIN: 13.8 g/dL (ref 12.0–15.0)
Lymphocytes Relative: 13 %
Lymphs Abs: 0.6 10*3/uL — ABNORMAL LOW (ref 0.7–4.0)
MCH: 33.3 pg (ref 26.0–34.0)
MCHC: 32.5 g/dL (ref 30.0–36.0)
MCV: 102.4 fL — ABNORMAL HIGH (ref 78.0–100.0)
MONOS PCT: 5 %
Monocytes Absolute: 0.2 10*3/uL (ref 0.1–1.0)
NEUTROS PCT: 81 %
Neutro Abs: 4.2 10*3/uL (ref 1.7–7.7)
Platelets: 93 10*3/uL — ABNORMAL LOW (ref 150–400)
RBC: 4.14 MIL/uL (ref 3.87–5.11)
RDW: 14.4 % (ref 11.5–15.5)
WBC: 5.1 10*3/uL (ref 4.0–10.5)

## 2018-07-03 LAB — GLUCOSE, CAPILLARY
GLUCOSE-CAPILLARY: 383 mg/dL — AB (ref 70–99)
Glucose-Capillary: 403 mg/dL — ABNORMAL HIGH (ref 70–99)
Glucose-Capillary: 406 mg/dL — ABNORMAL HIGH (ref 70–99)
Glucose-Capillary: 458 mg/dL — ABNORMAL HIGH (ref 70–99)

## 2018-07-03 LAB — I-STAT TROPONIN, ED: Troponin i, poc: 0 ng/mL (ref 0.00–0.08)

## 2018-07-03 MED ORDER — PREGABALIN 50 MG PO CAPS
50.0000 mg | ORAL_CAPSULE | Freq: Two times a day (BID) | ORAL | Status: DC
Start: 2018-07-03 — End: 2018-07-06
  Administered 2018-07-03 – 2018-07-06 (×7): 50 mg via ORAL
  Filled 2018-07-03 (×7): qty 1

## 2018-07-03 MED ORDER — AZITHROMYCIN 250 MG PO TABS
500.0000 mg | ORAL_TABLET | Freq: Every day | ORAL | Status: AC
  Administered 2018-07-03: 500 mg via ORAL
  Filled 2018-07-03: qty 2

## 2018-07-03 MED ORDER — INSULIN ASPART 100 UNIT/ML ~~LOC~~ SOLN
0.0000 [IU] | Freq: Every day | SUBCUTANEOUS | Status: DC
  Administered 2018-07-04: 5 [IU] via SUBCUTANEOUS

## 2018-07-03 MED ORDER — SODIUM CHLORIDE 0.9 % IV SOLN: 250.0000 mL | INTRAVENOUS | Status: DC | PRN

## 2018-07-03 MED ORDER — INSULIN ASPART 100 UNIT/ML ~~LOC~~ SOLN
7.0000 [IU] | Freq: Once | SUBCUTANEOUS | Status: AC
  Administered 2018-07-03: 7 [IU] via SUBCUTANEOUS

## 2018-07-03 MED ORDER — BASAGLAR KWIKPEN 100 UNIT/ML ~~LOC~~ SOPN: 25.0000 [IU] | PEN_INJECTOR | Freq: Every day | SUBCUTANEOUS | Status: DC

## 2018-07-03 MED ORDER — ALBUTEROL SULFATE (2.5 MG/3ML) 0.083% IN NEBU
2.5000 mg | INHALATION_SOLUTION | RESPIRATORY_TRACT | Status: DC | PRN
  Administered 2018-07-05: 2.5 mg via RESPIRATORY_TRACT
  Filled 2018-07-03: qty 3

## 2018-07-03 MED ORDER — ACETAMINOPHEN 325 MG PO TABS
650.0000 mg | ORAL_TABLET | Freq: Four times a day (QID) | ORAL | Status: DC | PRN
Start: 2018-07-03 — End: 2018-07-06

## 2018-07-03 MED ORDER — GUAIFENESIN ER 600 MG PO TB12
600.0000 mg | ORAL_TABLET | Freq: Two times a day (BID) | ORAL | Status: DC
  Administered 2018-07-03 – 2018-07-05 (×5): 600 mg via ORAL
  Filled 2018-07-03 (×5): qty 1

## 2018-07-03 MED ORDER — ALBUTEROL SULFATE (2.5 MG/3ML) 0.083% IN NEBU: 2.5000 mg | INHALATION_SOLUTION | Freq: Four times a day (QID) | RESPIRATORY_TRACT | Status: DC

## 2018-07-03 MED ORDER — TRAMADOL HCL 50 MG PO TABS
50.0000 mg | ORAL_TABLET | Freq: Three times a day (TID) | ORAL | Status: DC
  Administered 2018-07-03 – 2018-07-06 (×10): 50 mg via ORAL
  Filled 2018-07-03 (×10): qty 1

## 2018-07-03 MED ORDER — IPRATROPIUM BROMIDE 0.02 % IN SOLN: 0.5000 mg | Freq: Four times a day (QID) | RESPIRATORY_TRACT | Status: DC

## 2018-07-03 MED ORDER — ENOXAPARIN SODIUM 40 MG/0.4ML ~~LOC~~ SOLN
40.0000 mg | SUBCUTANEOUS | Status: DC
  Administered 2018-07-03 – 2018-07-06 (×4): 40 mg via SUBCUTANEOUS
  Filled 2018-07-03 (×4): qty 0.4

## 2018-07-03 MED ORDER — ACETAMINOPHEN 650 MG RE SUPP
650.0000 mg | Freq: Four times a day (QID) | RECTAL | Status: DC | PRN
Start: 2018-07-03 — End: 2018-07-06

## 2018-07-03 MED ORDER — INSULIN ASPART 100 UNIT/ML ~~LOC~~ SOLN
5.0000 [IU] | Freq: Three times a day (TID) | SUBCUTANEOUS | Status: DC
  Administered 2018-07-04 – 2018-07-06 (×9): 5 [IU] via SUBCUTANEOUS

## 2018-07-03 MED ORDER — INSULIN GLARGINE 100 UNIT/ML ~~LOC~~ SOLN
25.0000 [IU] | Freq: Every day | SUBCUTANEOUS | Status: DC
  Administered 2018-07-03: 25 [IU] via SUBCUTANEOUS
  Filled 2018-07-03: qty 0.25

## 2018-07-03 MED ORDER — SODIUM CHLORIDE 0.9% FLUSH: 3.0000 mL | INTRAVENOUS | Status: DC | PRN

## 2018-07-03 MED ORDER — IPRATROPIUM-ALBUTEROL 0.5-2.5 (3) MG/3ML IN SOLN
3.0000 mL | Freq: Four times a day (QID) | RESPIRATORY_TRACT | Status: DC
  Administered 2018-07-03 – 2018-07-06 (×13): 3 mL via RESPIRATORY_TRACT
  Filled 2018-07-03 (×14): qty 3

## 2018-07-03 MED ORDER — HYDROCERIN EX CREA
TOPICAL_CREAM | Freq: Two times a day (BID) | CUTANEOUS | Status: DC
  Administered 2018-07-03: 1 via TOPICAL
  Administered 2018-07-03 – 2018-07-06 (×6): via TOPICAL
  Filled 2018-07-03: qty 113

## 2018-07-03 MED ORDER — ALBUTEROL SULFATE (2.5 MG/3ML) 0.083% IN NEBU
5.0000 mg | INHALATION_SOLUTION | Freq: Once | RESPIRATORY_TRACT | Status: AC
  Administered 2018-07-03: 5 mg via RESPIRATORY_TRACT
  Filled 2018-07-03: qty 6

## 2018-07-03 MED ORDER — IPRATROPIUM BROMIDE 0.02 % IN SOLN
0.5000 mg | Freq: Once | RESPIRATORY_TRACT | Status: AC
  Administered 2018-07-03: 0.5 mg via RESPIRATORY_TRACT
  Filled 2018-07-03: qty 2.5

## 2018-07-03 MED ORDER — INSULIN ASPART 100 UNIT/ML ~~LOC~~ SOLN
0.0000 [IU] | Freq: Three times a day (TID) | SUBCUTANEOUS | Status: DC
  Administered 2018-07-03 – 2018-07-04 (×4): 20 [IU] via SUBCUTANEOUS
  Administered 2018-07-04: 11 [IU] via SUBCUTANEOUS
  Administered 2018-07-05: 20 [IU] via SUBCUTANEOUS
  Administered 2018-07-05: 4 [IU] via SUBCUTANEOUS
  Administered 2018-07-05: 11 [IU] via SUBCUTANEOUS
  Administered 2018-07-06: 4 [IU] via SUBCUTANEOUS
  Administered 2018-07-06: 20 [IU] via SUBCUTANEOUS
  Administered 2018-07-06: 15 [IU] via SUBCUTANEOUS

## 2018-07-03 MED ORDER — PANTOPRAZOLE SODIUM 40 MG PO TBEC
40.0000 mg | DELAYED_RELEASE_TABLET | Freq: Two times a day (BID) | ORAL | Status: DC
  Administered 2018-07-03 – 2018-07-06 (×7): 40 mg via ORAL
  Filled 2018-07-03 (×8): qty 1

## 2018-07-03 MED ORDER — TORSEMIDE 20 MG PO TABS
20.0000 mg | ORAL_TABLET | Freq: Every day | ORAL | Status: DC
  Administered 2018-07-03 – 2018-07-05 (×3): 20 mg via ORAL
  Filled 2018-07-03 (×4): qty 1

## 2018-07-03 MED ORDER — GABAPENTIN 300 MG PO CAPS
600.0000 mg | ORAL_CAPSULE | Freq: Three times a day (TID) | ORAL | Status: DC
  Administered 2018-07-03 – 2018-07-06 (×11): 600 mg via ORAL
  Filled 2018-07-03 (×11): qty 2

## 2018-07-03 MED ORDER — SODIUM CHLORIDE 0.9% FLUSH
3.0000 mL | Freq: Two times a day (BID) | INTRAVENOUS | Status: DC
  Administered 2018-07-03 – 2018-07-05 (×5): 3 mL via INTRAVENOUS

## 2018-07-03 MED ORDER — POLYETHYLENE GLYCOL 3350 17 G PO PACK
17.0000 g | PACK | Freq: Every day | ORAL | Status: DC
  Administered 2018-07-03 – 2018-07-05 (×3): 17 g via ORAL
  Filled 2018-07-03 (×4): qty 1

## 2018-07-03 MED ORDER — AZITHROMYCIN 250 MG PO TABS
250.0000 mg | ORAL_TABLET | Freq: Every day | ORAL | Status: DC
  Administered 2018-07-04 – 2018-07-06 (×3): 250 mg via ORAL
  Filled 2018-07-03 (×3): qty 1

## 2018-07-03 NOTE — Progress Notes (Signed)
Inpatient Diabetes Program Recommendations  AACE/ADA: New Consensus Statement on Inpatient Glycemic Control (2015)  Target Ranges:  Prepandial:   less than 140 mg/dL      Peak postprandial:   less than 180 mg/dL (1-2 hours)      Critically ill patients:  140 - 180 mg/dL   Lab Results  Component Value Date   GLUCAP 406 (H) 07/03/2018   HGBA1C 6.7 (H) 02/24/2018    Review of Glycemic Control  Diabetes history: DM2 Outpatient Diabetes medications: Basaglar 25 units QHS, Humalog 15 units tidwc Current orders for Inpatient glycemic control: Lantus 25 units QHS, Novolog 0-20 units tidwc and hs  Post-prandials elevated. Needs meal coverage insulin.  Inpatient Diabetes Program Recommendations:     Novolog 5 units tidwc for meal coverage insulin Needs updated HgbA1C  Continue to follow.  Thank you. Mary [name], RD, LDN, CDE Inpatient Diabetes Coordinator [phone]

## 2018-07-03 NOTE — ED Triage Notes (Signed)
[facility] with complaints of [facility]B,patient has COPD-SOB x 2 days-awoke 1 hour ago and could not catch her breath-patient states her sat at home in the 60's-she placed herself on O2 and administered a neb with no relief and called 911. EMS administered albuterol 12.5 mg and atrovent 0.5 mg and Solumedrol 125 mg.

## 2018-07-03 NOTE — Progress Notes (Signed)
Rt gave pt flutter valve. Pt knows and understands how to use. 

## 2018-07-03 NOTE — ED Notes (Signed)
ED TO INPATIENT HANDOFF REPORT  Name/Age/Gender Mary [name] 41 y.o. female  Code Status Code Status History    Date Active Date Inactive Code Status Order ID Comments User Context   05/12/2018 1258 05/15/2018 2102 Full Code [id number]  Adhikari, Amrit, MD ED   05/03/2018 1352 05/08/2018 1956 Full Code [id number]  Konidena, Snehalatha, MD ED   02/08/2018 2251 02/10/2018 2017 Full Code [id number]  Emokpae, Ejiroghene E, MD Inpatient   01/11/2018 1120 01/14/2018 1230 Full Code [id number]  Grunz, Ryan B, MD Inpatient   04/29/2017 2214 05/03/2017 1337 Full Code [id number]  Opyd, Timothy S, MD ED   04/24/2017 1149 04/27/2017 2252 Full Code [id number]  Sheehan, Theresa C, MD ED   03/24/2017 1745 03/28/2017 2034 Full Code [id number]  Hongalgi, Anand D, MD ED   02/13/2017 1707 02/15/2017 2124 Full Code [id number]  Hongalgi, Anand D, MD Inpatient   02/09/2017 0820 02/11/2017 1954 Full Code [id number]  Hongalgi, Anand D, MD Inpatient   01/31/2017 2036 02/04/2017 1842 Full Code [id number]  Doutova, Anastassia, MD Inpatient   01/11/2017 1057 01/15/2017 1803 Full Code [id number]  Merrell, David J, MD ED   08/25/2016 1304 08/28/2016 1431 Full Code [id number]  Merrell, David J, MD ED   08/09/2016 1848 08/13/2016 2127 Full Code [id number]  Merrell, David J, MD Inpatient   07/18/2016 0757 07/22/2016 2000 Full Code [id number]  Wertman, Sara E, PA-C ED   04/01/2016 2308 04/11/2016 1932 Full Code [id number]  Smith, Rondell A, MD ED   12/16/2015 1823 12/19/2015 2109 Full Code [id number]  Regalado, Belkys A, MD Inpatient   11/14/2015 0232 11/17/2015 1426 Full Code [id number]  Khan, Saadat A, MD Inpatient   01/13/2015 0407 01/15/2015 2052 Full Code [id number]  Gardner, Jared M, DO ED   12/14/2014 0413 12/18/2014 1608 Full Code [id number]  Kakrakandy, Arshad N, MD Inpatient   05/03/2013 0935 05/04/2013 1524 Full Code [id number]  Wyatt, James O, MD Inpatient   02/28/2013 1038 03/02/2013 1919 Full Code [id number]  Kalia-Reynolds, Maitri S, DO Inpatient   02/28/2013 0805 02/28/2013 1038  Full Code [id number]  Kalia-Reynolds, Maitri S, DO ED   03/06/2012 1310 03/11/2012 1824 Full Code [id number]  Matthews, Holly Annette, RN Inpatient      Home/SNF/Other Home  Chief Complaint copd, sob  Level of Care/Admitting Diagnosis ED Disposition    ED Disposition Condition Comment   Admit  Hospital Area: [facility] [[id number]]  Level of Care: Med-Surg [16]  Diagnosis: COPD exacerbation (HCC) [331795]  Admitting Physician: RIZWAN, SAIMA [[id number]]  Attending Physician: RIZWAN, SAIMA [[id number]]  Estimated length of stay: past midnight tomorrow  Certification:: I certify this patient will need inpatient services for at least 2 midnights  PT Class (Do Not Modify): Inpatient [101]  PT Acc Code (Do Not Modify): Private [1]       Medical History Past Medical History:  Diagnosis Date  . Asthma   . Axillary adenopathy    right axillary adenopathy noted on CT chest (03/06/2012)  . Chronic right-sided CHF (congestive heart failure) (HCC) 03/23/2012   with cor pulmonale. Last RHC 03/2012  . COPD (chronic obstructive pulmonary disease) (HCC)    PFTS 03/08/12: fev1 0.58L/1%, FVC 1.18/33%, Ratop 49 and c/w ssevere obstruction. 21% BD response on FVC, RV 219%, DLCO 11/54%  . Diabetes mellitus without complication (HCC)   . DVT of upper extremity (deep vein thrombosis) (HCC) April 2013   right subclavian // Unclear precipitating cause - possibly significant right heart failure, with vascular stasis potentially predisposing to   clotting.    . Exertional shortness of breath   . Hepatic cirrhosis (HCC)    Questionable history of - Noted on CT abdomen (03/2012) - thought to be due to vascular congestion from right heart failure +/- patient's history of alcohol abuse  . History of alcoholism (HCC)   . History of chronic bronchitis   . Irregular menses   . Migraine    "~ 1/yr" (05/03/2013)  . Moderate to severe pulmonary hypertension (HCC) April 2013   Cardiac cath on 03/06/12 - 1. Elevated  pulmonary artery pressures, right sided filling pressures.,  2. PA: 64/45 (mean 53)    . On home oxygen therapy    "2-3 L 24/7" (05/03/2013)  . OSA (obstructive sleep apnea)    wears noctural BiPAP (05/03/2013)  . Periodontal disease   . Pneumonia ~ 1985; 01/2011  . Pulmonary embolus (HCC) April 2013   Precipitating cause unclear. Was treated with coumadin from April-June 2013.  . Pulmonary hypertension (HCC)     Allergies Allergies  Allergen Reactions  . Bee Venom Swelling    Pt states she swells up 3x normal size. If stung "2-3 times, my throat could close up"    IV Location/Drains/Wounds Patient Lines/Drains/Airways Status   Active Line/Drains/Airways    Name:   Placement date:   Placement time:   Site:   Days:   Peripheral IV 07/03/18 Left Hand   07/03/18    -    Hand   less than 1          Labs/Imaging Results for orders placed or performed during the hospital encounter of 07/03/18 (from the past 48 hour(s))  CBC with Differential/Platelet     Status: Abnormal   Collection Time: 07/03/18  6:49 AM  Result Value Ref Range   WBC 5.1 4.0 - 10.5 K/uL   RBC 4.14 3.87 - 5.11 MIL/uL   Hemoglobin 13.8 12.0 - 15.0 g/dL   HCT 42.4 36.0 - 46.0 %   MCV 102.4 (H) 78.0 - 100.0 fL   MCH 33.3 26.0 - 34.0 pg   MCHC 32.5 30.0 - 36.0 g/dL   RDW 14.4 11.5 - 15.5 %   Platelets 93 (L) 150 - 400 K/uL    Comment: REPEATED TO VERIFY SPECIMEN CHECKED FOR CLOTS PLATELET COUNT CONFIRMED BY SMEAR    Neutrophils Relative % 81 %   Neutro Abs 4.2 1.7 - 7.7 K/uL   Lymphocytes Relative 13 %   Lymphs Abs 0.6 (L) 0.7 - 4.0 K/uL   Monocytes Relative 5 %   Monocytes Absolute 0.2 0.1 - 1.0 K/uL   Eosinophils Relative 1 %   Eosinophils Absolute 0.0 0.0 - 0.7 K/uL   Basophils Relative 0 %   Basophils Absolute 0.0 0.0 - 0.1 K/uL    Comment: Performed at [facility], [address] [place], [place] [address]  Basic metabolic panel     Status: Abnormal   Collection Time: 07/03/18   6:49 AM  Result Value Ref Range   Sodium 136 135 - 145 mmol/L   Potassium 3.7 3.5 - 5.1 mmol/L   Chloride 89 (L) 98 - 111 mmol/L   CO2 35 (H) 22 - 32 mmol/L   Glucose, Bld 285 (H) 70 - 99 mg/dL   BUN 6 6 - 20 mg/dL   Creatinine, Ser 0.56 0.44 - 1.00 mg/dL   Calcium 9.8 8.9 - 10.3 mg/dL   GFR calc non Af Amer >60 >60 mL/min   GFR calc Af Amer >  60 >60 mL/min    Comment: (NOTE) The eGFR has been calculated using the CKD EPI equation. This calculation has not been validated in all clinical situations. eGFR's persistently <60 mL/min signify possible Chronic Kidney Disease.    Anion gap 12 5 - 15    Comment: Performed at [facility], [address] [place], [place] [address]  I-stat troponin, ED     Status: None   Collection Time: 07/03/18  7:11 AM  Result Value Ref Range   Troponin i, poc 0.00 0.00 - 0.08 ng/mL   Comment 3            Comment: Due to the release kinetics of cTnI, a negative result within the first hours of the onset of symptoms does not rule out myocardial infarction with certainty. If myocardial infarction is still suspected, repeat the test at appropriate intervals.    Dg Chest 2 View  Result Date: 07/03/2018 CLINICAL DATA:  Shortness of breath.  Hypoxia.  History of COPD. EXAM: CHEST - 2 VIEW COMPARISON:  Chest radiograph May 12, 2018 FINDINGS: Cardiac silhouette is mildly enlarged unchanged. Coronary artery stent versus calcification. Increased lung volumes. LEFT lung base scarring without pleural effusion or focal consolidation. No pneumothorax. Large body habitus. Osseous structures are non suspicious. IMPRESSION: 1. Stable cardiomegaly. 2. stable hyperinflation with LEFT lung base scarring Electronically Signed   By: Courtnay  Bloomer M.D.   On: 07/03/2018 06:49    Pending Labs Unresulted Labs (From admission, onward)   None      Vitals/Pain Today's Vitals   07/03/18 0745 07/03/18 0800 07/03/18 0815 07/03/18 0830  BP:  120/71   124/69  Pulse: (!) 105 (!) 104 (!) 110 (!) 105  Resp: 19 (!) 24 (!) 23 (!) 24  Temp:      TempSrc:      SpO2: 99% 98% 97% 90%    Isolation Precautions No active isolations  Medications Medications  albuterol (PROVENTIL) (2.5 MG/3ML) 0.083% nebulizer solution 5 mg (5 mg Nebulization Given 07/03/18 0534)  ipratropium (ATROVENT) nebulizer solution 0.5 mg (0.5 mg Nebulization Given 07/03/18 0734)  albuterol (PROVENTIL) (2.5 MG/3ML) 0.083% nebulizer solution 5 mg (5 mg Nebulization Given 07/03/18 0735)    Mobility walks  

## 2018-07-03 NOTE — H&P (Addendum)
History and Physical    Mary [name]  ZOX:[id number]  DOB: Jul 12, 1976  DOA: 07/03/2018 PCP: Corwin Levins, MD   Patient coming from: home  Chief Complaint: shortness of breath  HPI: Mary [name] is a 42 y.o. female with medical history of COPD on 3 L O 2, OSA/OHS, morbid obesity, DM2 though to be related to chronic steroid use, pulm HTN, h/o DVT, chronic pain on multiple medications and multiple hospital admissions this year for COPD exacerbations.  She was last admitted from 05/12/18-05/15/18 and this current admission is her 5th since Feb of this year. She was on her usual of 3 L NS when she became short of breath when ambulating to the rest room and found her pulse ox to be in the 60s. She took some breahing treatments and then called EMS who found her pulse ox to be in the 80. She states she has been coughing up less mucous than usually. No fever or chills.  At baseline she is able to walk some distance and her husband makes sure to force her to get out every day to walk.  ED Course: given Solumedrol, Atrovent and Albuterol nebs. Pulse ox now about 94% on 4 L O2. She has ambulated to the bathroom and still feels very short of breath.  Review of Systems:  All other systems reviewed and apart from HPI, are negative.  Past Medical History:  Diagnosis Date  . Asthma   . Axillary adenopathy    right axillary adenopathy noted on CT chest (03/06/2012)  . Chronic right-sided CHF (congestive heart failure) (HCC) 03/23/2012   with cor pulmonale. Last RHC 03/2012  . COPD (chronic obstructive pulmonary disease) (HCC)    PFTS 03/08/12: fev1 0.58L/1%, FVC 1.18/33%, Ratop 49 and c/w ssevere obstruction. 21% BD response on FVC, RV 219%, DLCO 11/54%  . Diabetes mellitus without complication (HCC)   . DVT of upper extremity (deep vein thrombosis) [facility]) April 2013   right subclavian // Unclear precipitating cause - possibly significant right heart failure, with vascular stasis potentially  predisposing to clotting.    . Exertional shortness of breath   . Hepatic cirrhosis (HCC)    Questionable history of - Noted on CT abdomen (03/2012) - thought to be due to vascular congestion from right heart failure +/- patient's history of alcohol abuse  . History of alcoholism (HCC)   . History of chronic bronchitis   . Irregular menses   . Migraine    "~ 1/yr" (05/03/2013)  . Moderate to severe pulmonary hypertension [facility]) April 2013   Cardiac cath on 03/06/12 - 1. Elevated pulmonary artery pressures, right sided filling pressures.,  2. PA: 64/45 (mean 53)    . On home oxygen therapy    "2-3 L 24/7" (05/03/2013)  . OSA (obstructive sleep apnea)    wears noctural BiPAP (05/03/2013)  . Periodontal disease   . Pneumonia ~ 1985; 01/2011  . Pulmonary embolus [facility]) April 2013   Precipitating cause unclear. Was treated with coumadin from April-June 2013.  . Pulmonary hypertension (HCC)     Past Surgical History:  Procedure Laterality Date  . APPENDECTOMY  05/03/2013  . CARDIAC CATHETERIZATION  03/2012  . CARDIAC CATHETERIZATION N/A 01/08/2016   Procedure: Right Heart Cath;  Surgeon: Dolores Patty, MD;  Location: [facility] INVASIVE CV LAB;  Service: Cardiovascular;  Laterality: N/A;  . CARDIAC SURGERY  May 29, 1976   "my heart was backwards" (05/03/2013)  . LAPAROSCOPIC APPENDECTOMY N/A 05/03/2013   Procedure: APPENDECTOMY LAPAROSCOPIC;  Surgeon: Cherylynn Ridges, MD;  Location: [facility] OR;  Service: General;  Laterality: N/A;  . LUNG SURGERY  [birth year]05[birth year]1977  . RIGHT HEART CATHETERIZATION N/A 03/07/2012   Procedure: RIGHT HEART CATH;  Surgeon: Kathleene Hazel, MD;  Location: [facility] CATH LAB;  Service: Cardiovascular;  Laterality: N/A;    Social History:   reports that she has been smoking cigarettes.  She has a 6.25 pack-year smoking history. She has never used smokeless tobacco. She reports that she drank alcohol. She reports that she does not use drugs.  Allergies  Allergen Reactions  . Bee Venom  Swelling    Pt states she swells up 3x normal size. If stung "2-3 times, my throat could close up"    Family History  Problem Relation Age of Onset  . Hypertension Father   . Heart disease Father        CHF; died age 36   . Alcohol abuse Father   . Other Brother        died age 64 y.o overdose     Prior to Admission medications   Medication Sig Start Date End Date Taking? Authorizing Provider  albuterol (PROVENTIL HFA;VENTOLIN HFA) 108 (90 Base) MCG/ACT inhaler USE 2 PUFFS EVERY 6 HOURS AS NEEDED FOR WHEEZING 06/09/18  Yes Nyoka Cowden, MD  budesonide-formoterol (SYMBICORT) 160-4.5 MCG/ACT inhaler Inhale 2 puffs into the lungs daily. 04/19/18  Yes Kalman Shan, MD  gabapentin (NEURONTIN) 300 MG capsule TAKE 2 CAPSULES (600 MG TOTAL) BY MOUTH 3 (THREE) TIMES DAILY. 04/17/18  Yes Corwin Levins, MD  guaiFENesin-codeine 100-10 MG/5ML syrup Take 5 mLs by mouth every 6 (six) hours as needed for cough. 05/24/18  Yes Corwin Levins, MD  HUMALOG KWIKPEN 100 UNIT/ML KiwkPen Inject 15 mg into the skin 3 (three) times daily with meals. 05/24/18  Yes [provider]  Insulin Glargine (BASAGLAR KWIKPEN) 100 UNIT/ML SOPN Inject 0.25 mLs (25 Units total) into the skin daily at 10 pm. E11.9 05/24/18  Yes Corwin Levins, MD  pantoprazole (PROTONIX) 40 MG tablet Take 1 tablet (40 mg total) by mouth 2 (two) times daily before a meal. 07/22/16  Yes Jeralyn Bennett, MD  predniSONE (DELTASONE) 50 MG tablet Take 1 tablet (50 mg total) by mouth daily with breakfast. 05/15/18  Yes Purohit, Salli Quarry, MD  pregabalin (LYRICA) 50 MG capsule Take 1 capsule (50 mg total) by mouth 2 (two) times daily. 10/31/17  Yes Plotnikov, Georgina Quint, MD  saccharomyces boulardii (FLORASTOR) 250 MG capsule Take 1 capsule (250 mg total) by mouth 2 (two) times daily. 02/10/18  Yes Albertine Grates, MD  SPIRIVA RESPIMAT 1.25 MCG/ACT AERS INHALE 2 PUFFS INTO THE LUNGS DAILY. 06/14/18  Yes Kalman Shan, MD  torsemide (DEMADEX) 20 MG tablet  TAKE 1 TABLET BY MOUTH EVERY DAY 04/17/18  Yes Corwin Levins, MD  traMADol (ULTRAM) 50 MG tablet TAKE 1 TABLET BY MOUTH EVERY 8 HOURS AS NEEDED FOR SEVERE PAIN 05/10/18  Yes Corwin Levins, MD  glucose blood (ONE TOUCH ULTRA TEST) test strip Use as instructed four times per day E11.9 05/24/18   Corwin Levins, MD  Insulin Pen Needle (BD PEN NEEDLE NANO U/F) 32G X 4 MM MISC Use as directed four times per day daily E11.9 05/24/18   Corwin Levins, MD  NOVOLOG FLEXPEN 100 UNIT/ML FlexPen USE 15 UNITS THREE TIMES PER DAY WITH MEALS Patient not taking: Reported on 07/03/2018 05/24/18   Corwin Levins, MD    Physical  Exam: Wt Readings from Last 3 Encounters:  05/24/18 90.7 kg (200 lb)  05/12/18 90.7 kg (200 lb)  05/08/18 91.2 kg (201 lb)   Vitals:   07/03/18 0800 07/03/18 0815 07/03/18 0830 07/03/18 0952  BP: 120/71  124/69 118/73  Pulse: (!) 104 (!) 110 (!) 105 (!) 105  Resp: (!) 24 (!) 23 (!) 24 18  Temp:    99.3 F (37.4 C)  TempSrc:    Oral  SpO2: 98% 97% 90% 93%      Constitutional:  Calm & comfortable Eyes: PERRLA, right eye squint, lids and conjunctivae normal ENT:  Mucous membranes are moist.  Pharynx clear of exudate   Normal dentition.  Neck: Supple, no masses  Respiratory:  Very poor breath sounds with mild expiratory wheeze in left lower lung field- able to complete full sentences Cardiovascular:  S1 & S2 heard, regular rate and rhythm No Murmurs Abdomen:  Non distended   Tenderness in epigastrium No masses Bowel sounds normal Extremities:  No clubbing / cyanosis No pedal edema No joint deformity    Skin:  No rashes, lesions or ulcers- skin on feet is severely dry with fissures at the heels Neurologic:  AAO x 3 CN 2-12 grossly intact Sensation intact Strength 5/5 in all 4 extremities Psychiatric:  Normal Mood and affect    Labs on Admission: I have personally reviewed following labs and imaging studies  CBC: Recent Labs  Lab 07/03/18 0649  WBC 5.1    NEUTROABS 4.2  HGB 13.8  HCT 42.4  MCV 102.4*  PLT 93*   Basic Metabolic Panel: Recent Labs  Lab 07/03/18 0649  NA 136  K 3.7  CL 89*  CO2 35*  GLUCOSE 285*  BUN 6  CREATININE 0.56  CALCIUM 9.8   GFR: CrCl cannot be calculated (Unknown ideal weight.). Liver Function Tests: No results for input(s): AST, ALT, ALKPHOS, BILITOT, PROT, ALBUMIN in the last 168 hours. No results for input(s): LIPASE, AMYLASE in the last 168 hours. No results for input(s): AMMONIA in the last 168 hours. Coagulation Profile: No results for input(s): INR, PROTIME in the last 168 hours. Cardiac Enzymes: No results for input(s): CKTOTAL, CKMB, CKMBINDEX, TROPONINI in the last 168 hours. BNP (last 3 results) No results for input(s): PROBNP in the last 8760 hours. HbA1C: No results for input(s): HGBA1C in the last 72 hours. CBG: No results for input(s): GLUCAP in the last 168 hours. Lipid Profile: No results for input(s): CHOL, HDL, LDLCALC, TRIG, CHOLHDL, LDLDIRECT in the last 72 hours. Thyroid Function Tests: No results for input(s): TSH, T4TOTAL, FREET4, T3FREE, THYROIDAB in the last 72 hours. Anemia Panel: No results for input(s): VITAMINB12, FOLATE, FERRITIN, TIBC, IRON, RETICCTPCT in the last 72 hours. Urine analysis:    Component Value Date/Time   COLORURINE YELLOW 05/12/2018 1112   APPEARANCEUR HAZY (A) 05/12/2018 1112   LABSPEC 1.030 05/12/2018 1112   PHURINE 6.0 05/12/2018 1112   GLUCOSEU >=500 (A) 05/12/2018 1112   GLUCOSEU 100 (A) 02/24/2018 1644   HGBUR NEGATIVE 05/12/2018 1112   BILIRUBINUR NEGATIVE 05/12/2018 1112   KETONESUR 5 (A) 05/12/2018 1112   PROTEINUR NEGATIVE 05/12/2018 1112   UROBILINOGEN 2.0 (A) 02/24/2018 1644   NITRITE NEGATIVE 05/12/2018 1112   LEUKOCYTESUR MODERATE (A) 05/12/2018 1112   Sepsis Labs: @LABRCNTIP (procalcitonin:4,lacticidven:4) )No results found for this or any previous visit (from the past 240 hour(s)).   Radiological Exams on  Admission: Dg Chest 2 View  Result Date: 07/03/2018 CLINICAL DATA:  Shortness of breath.  Hypoxia.  History of COPD. EXAM: CHEST - 2 VIEW COMPARISON:  Chest radiograph May 12, 2018 FINDINGS: Cardiac silhouette is mildly enlarged unchanged. Coronary artery stent versus calcification. Increased lung volumes. LEFT lung base scarring without pleural effusion or focal consolidation. No pneumothorax. Large body habitus. Osseous structures are non suspicious. IMPRESSION: 1. Stable cardiomegaly. 2. stable hyperinflation with LEFT lung base scarring Electronically Signed   By: Awilda Metroourtnay  Bloomer M.D.   On: 07/03/2018 06:49    EKG: Independently reviewed. Sinus tachy at 116  Assessment/Plan Principal Problem:   COPD exacerbation / acute respiratory failure - cont Solumedrol and Nebs - add Mucinex, flutter valve, Z pak, IS - I discussed her plan to lose weight and go back to [facility] for lung transplant eval again but she is very wary of having a transplan  Active Problems:   OSA on BiPAP   Obesity hypoventilation syndrome  - cont BiPAP QHS    Pulmonary hypertension / ch diastolic CHF - not fluid overloaded - cont Demadex    Morbid obesity   - I have spoken with her about getting a referral from her PCP for the [facility]    Steroid-induced diabetes - she states her sugars are well controlled once she weans off of the Prednisone tapers and does not need medications at baseline - last A1c  6.7 on 02/24/18 - she states she usually needs about 25 U of Lantus and mealtime insulin when she is admitted- her meds list does state that she was on on 25 U of Basaglar at one point and thus I have added this amount of Lantus to be started at bedtime today.  - place on SSI as well   Chronic pain/ Neuropathy   - feet and legs - cont Lyrica, Tramadol and Neurontin  Severely dry skin in feet - due to having neuropathy/ diabetes and frequently having to take sterois, she is a set up for  cellulitis/abscess formation - start Eucerin cream BID- I have advised her on how to better care for them at home   DVT prophylaxis: Lovenox  Code Status: Full code  Family Communication:   Disposition Plan: med/surg  Consults called: none  Admission status: inpatient    Calvert CantorSaima Ravynn Hogate MD [facility] Pager: [email or web address] Password [email or web address] 7PM-7AM, please contact night-coverage   07/03/2018, 10:00 AM

## 2018-07-03 NOTE — ED Provider Notes (Signed)
[facility]-EMERGENCY DEPT Provider Note   CSN: [id number] Arrival date & time: 07/03/18  4098     History   Chief Complaint Chief Complaint  Patient presents with  . COPD  . Shortness of Breath    HPI Mary [name] is a 42 y.o. female.  Patient with history of diabetes, COPD on 3 L home oxygen nasal cannula during the day and BiPAP at night, pulmonary hypertension, congestive heart failure on torsemide, PE not currently on anticoagulation --presents to the emergency department with worsening shortness of breath and hypoxia.  Patient states that she has "not felt well" for the past couple of days.  She woke up this morning to use the restroom and felt extremely dizzy and lightheaded with ambulation.  She sat down in her chair and checked her pulse ox and found it to be about 60%.  Patient then took a nebulizer treatment for about 15 minutes and called the ambulance.  Upon ambulance arrival, oxygen saturation was reportedly in the low 80s.  Symptoms are consistent with previous COPD exacerbations.  After home treatment, patient was given 12.5 mg of albuterol and 0.5 mg of Atrovent as well as 125 mg of Solu-Medrol by EMS.  Patient has occasional cough.  She denies increased sputum production or fevers.  No nausea, vomiting, diarrhea.  Patient noted that she had some left jaw pain over the past couple of days, intermittent.  She denies any chest pain or worsening lower extremity swelling. The onset of this condition was acute. The course is constant. Aggravating factors: activity. Alleviating factors: none.       Past Medical History:  Diagnosis Date  . Asthma   . Axillary adenopathy    right axillary adenopathy noted on CT chest (03/06/2012)  . Chronic right-sided CHF (congestive heart failure) (HCC) 03/23/2012   with cor pulmonale. Last RHC 03/2012  . COPD (chronic obstructive pulmonary disease) (HCC)    PFTS 03/08/12: fev1 0.58L/1%, FVC 1.18/33%, Ratop 49 and c/w  ssevere obstruction. 21% BD response on FVC, RV 219%, DLCO 11/54%  . Diabetes mellitus without complication (HCC)   . DVT of upper extremity (deep vein thrombosis) [facility]) April 2013   right subclavian // Unclear precipitating cause - possibly significant right heart failure, with vascular stasis potentially predisposing to clotting.    . Exertional shortness of breath   . Hepatic cirrhosis (HCC)    Questionable history of - Noted on CT abdomen (03/2012) - thought to be due to vascular congestion from right heart failure +/- patient's history of alcohol abuse  . History of alcoholism (HCC)   . History of chronic bronchitis   . Irregular menses   . Migraine    "~ 1/yr" (05/03/2013)  . Moderate to severe pulmonary hypertension [facility]) April 2013   Cardiac cath on 03/06/12 - 1. Elevated pulmonary artery pressures, right sided filling pressures.,  2. PA: 64/45 (mean 53)    . On home oxygen therapy    "2-3 L 24/7" (05/03/2013)  . OSA (obstructive sleep apnea)    wears noctural BiPAP (05/03/2013)  . Periodontal disease   . Pneumonia ~ 1985; 01/2011  . Pulmonary embolus [facility]) April 2013   Precipitating cause unclear. Was treated with coumadin from April-June 2013.  . Pulmonary hypertension [facility])     Patient Active Problem List   Diagnosis Date Noted  . COPD with exacerbation (HCC) 05/03/2018  . Prolonged QT interval 02/08/2018  . COPD with acute exacerbation (HCC) 01/11/2018  . Left  flank pain 11/01/2017  . Positive D dimer   . Leukocytosis 04/29/2017  . Acute exacerbation of chronic obstructive pulmonary disease (COPD) (HCC) 04/24/2017  . 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 Left ankle sprain 04/21/2017  . Fungemia 02/13/2017  . COPD (chronic obstructive pulmonary disease) (HCC) 02/13/2017  . Chronic respiratory failure with hypoxia and hypercapnia (HCC) 02/13/2017  . Alcohol abuse 01/31/2017  . Acute on chronic respiratory failure with hypoxia and hypercapnia (HCC)   .  Tracheomalacia   . Palliative care by specialist   . Anxiety state   . Advance directive declined by patient   . Goals of care, counseling/discussion   . Chronic hoarseness 07/27/2016  . Diabetic neuropathy (HCC) 04/30/2016  . Diabetes mellitus type 2 in obese (HCC) 04/10/2016  . Chronic diastolic heart failure (HCC) 01/25/2016  . PAH (pulmonary artery hypertension) (HCC)   . Constipation 11/18/2015  . Morbid obesity (HCC) 11/18/2015  . Lung nodule 01/30/2015  . Pulmonary mass 01/13/2015  . Gastroparesis   . Esophageal reflux   . Obstipation   . Hypokalemia 02/28/2013  . Financial difficulties 02/28/2013  . Obesity hypoventilation syndrome (HCC) 02/28/2013  . Myalgia 02/28/2013  . Anovulation 06/30/2012  . Preventative health care 05/05/2012  . Irregular menstrual cycle 04/27/2012  . Chronic right-sided CHF (congestive heart failure) (HCC) 03/23/2012  . Pulmonary hypertension (HCC) 03/23/2012  . Cor pulmonale (HCC) 03/21/2012  . OSA on BiPAP 03/19/2012  . Axillary lymphadenopathy 03/06/2012  . Congenital heart disease 01/28/2012  . Dental caries 01/28/2012    Past Surgical History:  Procedure Laterality Date  . APPENDECTOMY  05/03/2013  . CARDIAC CATHETERIZATION  03/2012  . CARDIAC CATHETERIZATION N/A 01/08/2016   Procedure: Right Heart Cath;  Surgeon: Dolores Pattyaniel R Bensimhon, MD;  [facility];  Service: Cardiovascular;  Laterality: N/A;  . CARDIAC SURGERY  10/16/1977   "my heart was backwards" (05/03/2013)  . LAPAROSCOPIC APPENDECTOMY N/A 05/03/2013   Procedure: APPENDECTOMY LAPAROSCOPIC;  Surgeon: Cherylynn RidgesJames O Wyatt, MD;  [facility];  Service: General;  Laterality: N/A;  . LUNG SURGERY  10/16/1977  . RIGHT HEART CATHETERIZATION N/A 03/07/2012   Procedure: RIGHT HEART CATH;  Surgeon: Kathleene Hazelhristopher D McAlhany, MD;  [facility];  Service: Cardiovascular;  Laterality: N/A;     OB History   None      Home Medications    Prior to Admission medications     Medication Sig Start Date End Date Taking? Authorizing Provider  albuterol (PROVENTIL HFA;VENTOLIN HFA) 108 (90 Base) MCG/ACT inhaler USE 2 PUFFS EVERY 6 HOURS AS NEEDED FOR WHEEZING 06/09/18  Yes Nyoka CowdenWert, Michael B, MD  budesonide-formoterol (SYMBICORT) 160-4.5 MCG/ACT inhaler Inhale 2 puffs into the lungs daily. 04/19/18  Yes Kalman Shanamaswamy, Murali, MD  gabapentin (NEURONTIN) 300 MG capsule TAKE 2 CAPSULES (600 MG TOTAL) BY MOUTH 3 (THREE) TIMES DAILY. 04/17/18  Yes Corwin LevinsJohn, [name], MD  guaiFENesin-codeine 100-10 MG/5ML syrup Take 5 mLs by mouth every 6 (six) hours as needed for cough. 05/24/18  Yes Corwin LevinsJohn, [name], MD  HUMALOG KWIKPEN 100 UNIT/ML KiwkPen Inject 15 mg into the skin 3 (three) times daily with meals. 05/24/18  Yes [provider]  Insulin Glargine (BASAGLAR KWIKPEN) 100 UNIT/ML SOPN Inject 0.25 mLs (25 Units total) into the skin daily at 10 pm. E11.9 05/24/18  Yes Corwin LevinsJohn, [name], MD  pantoprazole (PROTONIX) 40 MG tablet Take 1 tablet (40 mg total) by mouth 2 (two) times daily before a meal. 07/22/16  Yes Jeralyn BennettZamora, Ezequiel, MD  predniSONE (DELTASONE) 50 MG tablet Take 1 tablet (50 mg total) by mouth daily with breakfast. 05/15/18  Yes Purohit, Salli Quarry, MD  pregabalin (LYRICA) 50 MG capsule Take 1 capsule (50 mg total) by mouth 2 (two) times daily. 10/31/17  Yes Plotnikov, Georgina Quint, MD  saccharomyces boulardii (FLORASTOR) 250 MG capsule Take 1 capsule (250 mg total) by mouth 2 (two) times daily. 02/10/18  Yes Albertine Grates, MD  SPIRIVA RESPIMAT 1.25 MCG/ACT AERS INHALE 2 PUFFS INTO THE LUNGS DAILY. 06/14/18  Yes Kalman Shan, MD  torsemide (DEMADEX) 20 MG tablet TAKE 1 TABLET BY MOUTH EVERY DAY 04/17/18  Yes Corwin Levins, MD  traMADol (ULTRAM) 50 MG tablet TAKE 1 TABLET BY MOUTH EVERY 8 HOURS AS NEEDED FOR SEVERE PAIN 05/10/18  Yes Corwin Levins, MD  glucose blood (ONE TOUCH ULTRA TEST) test strip Use as instructed four times per day E11.9 05/24/18   Corwin Levins, MD  Insulin Pen Needle (BD PEN NEEDLE  NANO U/F) 32G X 4 MM MISC Use as directed four times per day daily E11.9 05/24/18   Corwin Levins, MD  NOVOLOG FLEXPEN 100 UNIT/ML FlexPen USE 15 UNITS THREE TIMES PER DAY WITH MEALS Patient not taking: Reported on 07/03/2018 05/24/18   Corwin Levins, MD    Family History Family History  Problem Relation Age of Onset  . Hypertension Father   . Heart disease Father        CHF; died age 31   . Alcohol abuse Father   . Other Brother        died age 94 y.o overdose    Social History Social History   Tobacco Use  . Smoking status: Current Some Day Smoker    Packs/day: 0.25    Years: 25.00    Pack years: 6.25    Types: Cigarettes    Last attempt to quit: 01/08/2018    Years since quitting: 0.4  . Smokeless tobacco: Never Used  . Tobacco comment: a couple drags of one cigarettes  Substance Use Topics  . Alcohol use: Not Currently  . Drug use: No     Allergies   Bee venom   Review of Systems Review of Systems  Constitutional: Negative for fever.  HENT: Negative for rhinorrhea and sore throat.   Eyes: Negative for redness.  Respiratory: Positive for cough, shortness of breath and wheezing.   Cardiovascular: Negative for chest pain.  Gastrointestinal: Negative for abdominal pain, diarrhea, nausea and vomiting.  Genitourinary: Negative for dysuria.  Musculoskeletal: Negative for myalgias.  Skin: Negative for rash.  Neurological: Negative for headaches.     Physical Exam Updated Vital Signs BP 119/69   Pulse (!) 113   Temp 98 F (36.7 C) (Oral)   Resp 19   SpO2 91%   Physical Exam  Constitutional: She appears well-developed and well-nourished.  HENT:  Head: Normocephalic and atraumatic.  Eyes: Conjunctivae are normal. Right eye exhibits no discharge. Left eye exhibits no discharge.  Neck: Normal range of motion. Neck supple.  Cardiovascular: Regular rhythm and normal heart sounds. Tachycardia present.  Pulmonary/Chest: Effort normal. She has decreased breath  sounds. She has wheezes (End expiratory, scattered). She has no rhonchi. She has no rales.  Abdominal: Soft. There is no tenderness.  Neurological: She is alert.  Skin: Skin is warm and dry.  Psychiatric: She has a normal mood and affect.  Nursing note and vitals reviewed.    ED Treatments / Results  Labs (all labs ordered  are listed, but only abnormal results are displayed) Labs Reviewed  CBC WITH DIFFERENTIAL/PLATELET - Abnormal; Notable for the following components:      Result Value   MCV 102.4 (*)    Platelets 93 (*)    Lymphs Abs 0.6 (*)    All other components within normal limits  BASIC METABOLIC PANEL - Abnormal; Notable for the following components:   Chloride 89 (*)    CO2 35 (*)    Glucose, Bld 285 (*)    All other components within normal limits  I-STAT TROPONIN, ED    EKG EKG Interpretation  Date/Time:  Monday July 03 2018 05:25:49 EDT Ventricular Rate:  116 PR Interval:    QRS Duration: 92 QT Interval:  340 QTC Calculation: 473 R Axis:   98 Text Interpretation:  Sinus tachycardia LAE, consider biatrial enlargement Borderline right axis deviation Borderline T abnormalities, anterior leads No significant change was found Confirmed by Paula Libra ([id number]) on 07/03/2018 5:32:20 AM Also confirmed by Paula Libra ([id number]), editor Elita Quick [phone])  on 07/03/2018 7:27:17 AM   Radiology Dg Chest 2 View  Result Date: 07/03/2018 CLINICAL DATA:  Shortness of breath.  Hypoxia.  History of COPD. EXAM: CHEST - 2 VIEW COMPARISON:  Chest radiograph May 12, 2018 FINDINGS: Cardiac silhouette is mildly enlarged unchanged. Coronary artery stent versus calcification. Increased lung volumes. LEFT lung base scarring without pleural effusion or focal consolidation. No pneumothorax. Large body habitus. Osseous structures are non suspicious. IMPRESSION: 1. Stable cardiomegaly. 2. stable hyperinflation with LEFT lung base scarring Electronically Signed   By: Awilda Metro  M.D.   On: 07/03/2018 06:49    Procedures Procedures (including critical care time)  Medications Ordered in ED Medications  albuterol (PROVENTIL) (2.5 MG/3ML) 0.083% nebulizer solution 5 mg (5 mg Nebulization Given 07/03/18 0534)  ipratropium (ATROVENT) nebulizer solution 0.5 mg (0.5 mg Nebulization Given 07/03/18 0734)  albuterol (PROVENTIL) (2.5 MG/3ML) 0.083% nebulizer solution 5 mg (5 mg Nebulization Given 07/03/18 0735)     Initial Impression / Assessment and Plan / ED Course  I have reviewed the triage vital signs and the nursing notes.  Pertinent labs & imaging results that were available during my care of the patient were reviewed by me and considered in my medical decision making (see chart for details).     Patient seen and examined. Work-up initiated. Medications ordered.  While talking to the patient in the room, she will desat to 87% on 4 L nasal cannula when she is talking.  Vital signs reviewed and are as follows: BP 119/69   Pulse (!) 113   Temp 98 F (36.7 C) (Oral)   Resp 19   SpO2 91%   8:24 AM Patient stable, symptoms stable. Will admit for additional treatment.  Patient agrees.  8:32 AM Spoke with Dr. Butler Denmark who will see patient.    Final Clinical Impressions(s) / ED Diagnoses   Final diagnoses:  COPD exacerbation (HCC)   COPD with worsening hypoxia, admit.   ED Discharge Orders    None       Renne Crigler, Cordelia Poche 07/03/18 [id number]    Maia Plan, MD 07/03/18 [phone]

## 2018-07-03 NOTE — Progress Notes (Signed)
Rt placed pt on CPAP per pt request. Pt settings 20/10 with 4 LPM bleed in.

## 2018-07-04 DIAGNOSIS — J441 Chronic obstructive pulmonary disease with (acute) exacerbation: Secondary | ICD-10-CM

## 2018-07-04 LAB — BASIC METABOLIC PANEL
Anion gap: 12 (ref 5–15)
BUN: 13 mg/dL (ref 6–20)
CALCIUM: 9.8 mg/dL (ref 8.9–10.3)
CHLORIDE: 86 mmol/L — AB (ref 98–111)
CO2: 39 mmol/L — AB (ref 22–32)
CREATININE: 0.55 mg/dL (ref 0.44–1.00)
GFR calc Af Amer: >60 mL/min (ref >60)
GFR calc non Af Amer: >60 mL/min (ref >60)
GLUCOSE: 354 mg/dL — AB (ref 70–99)
Potassium: 4.4 mmol/L (ref 3.5–5.1)
Sodium: 137 mmol/L (ref 135–145)

## 2018-07-04 LAB — GLUCOSE, CAPILLARY
GLUCOSE-CAPILLARY: 406 mg/dL — AB (ref 70–99)
Glucose-Capillary: 377 mg/dL — ABNORMAL HIGH (ref 70–99)
Glucose-Capillary: 294 mg/dL — ABNORMAL HIGH (ref 70–99)
Glucose-Capillary: 393 mg/dL — ABNORMAL HIGH (ref 70–99)

## 2018-07-04 LAB — CBC
HCT: 43.2 % (ref 36.0–46.0)
Hemoglobin: 13.8 g/dL (ref 12.0–15.0)
MCH: 32.9 pg (ref 26.0–34.0)
MCHC: 31.9 g/dL (ref 30.0–36.0)
MCV: 103.1 fL — AB (ref 78.0–100.0)
PLATELETS: 127 10*3/uL — AB (ref 150–400)
RBC: 4.19 MIL/uL (ref 3.87–5.11)
RDW: 14.2 % (ref 11.5–15.5)
WBC: 11.5 10*3/uL — ABNORMAL HIGH (ref 4.0–10.5)

## 2018-07-04 MED ORDER — INSULIN GLARGINE 100 UNIT/ML ~~LOC~~ SOLN
40.0000 [IU] | Freq: Every day | SUBCUTANEOUS | Status: DC
  Administered 2018-07-04 – 2018-07-05 (×2): 40 [IU] via SUBCUTANEOUS
  Filled 2018-07-04 (×3): qty 0.4

## 2018-07-04 MED ORDER — INSULIN ASPART 100 UNIT/ML ~~LOC~~ SOLN: 10.0000 [IU] | Freq: Once | SUBCUTANEOUS | Status: DC

## 2018-07-04 MED ORDER — TRAZODONE HCL 50 MG PO TABS
50.0000 mg | ORAL_TABLET | Freq: Once | ORAL | Status: AC
Start: 2018-07-04 — End: 2018-07-04
  Administered 2018-07-04: 50 mg via ORAL
  Filled 2018-07-04: qty 1

## 2018-07-04 NOTE — Progress Notes (Signed)
Educated pt on use of home inhalers. Pt demonstrates understanding.

## 2018-07-04 NOTE — Progress Notes (Signed)
PROGRESS NOTE    Mary [name]  ZOX:[id number] DOB: 12/10/1975 DOA: 07/03/2018 PCP: Corwin LevinsJohn, [name], MD   Brief Narrative: Patient is a 42 year old female with past medical history of COPD on 3 L oxygen at home, OSA/OHS on BiPAP at home, diabetes mellitus, pulmonary hypertension, history of DVT, chronic pain syndrome who presented to the emergency department with complaints of shortness of breath and increased oxygen requirement.  Patient has multiple admissions lately for the same complaint.  Patient was admitted for the management of COPD exacerbation.  Assessment & Plan:   Principal Problem:   COPD exacerbation (HCC) Active Problems:   OSA on BiPAP   Pulmonary hypertension (HCC)   Obesity hypoventilation syndrome (HCC)   Morbid obesity (HCC)   Steroid-induced diabetes (HCC)  Acute respiratory failure with hypoxia: Secondary from COPD exacerbation.  Continue supplemental oxygen bronchodilators ,respiratory status stable now.  COPD exacerbation: Has several admissions lately.  History of smoking but has quit.  On oxygen at 3 L/min at home.  Follows with pulmonology, Ramaswamy.  Takes Symbicort and Spiriva at home.  Will ensure that patient is taking her inhalers in a correct way.  We have requested for respiratory therapist to look into that.  Currently off steroids.  Patient does not have any no wheezing this morning.  Continue Mucinex, Z-Pak.  History of OSA/obesity hypoventilation syndrome: On BiPAP at home night.  History of pulmonary hypertension/chronic diastolic CHF: Continue Demadex.  Morbid obesity: She has been counseled on the importance of diet and exercise.  [facility].  Diabetes type 2: Most likely steroid-induced.  Last hemoglobin A1c of 6.7 on 02/24/18.  Currently on Lantus and sliding scale insulin.  Need to increase the dose of Lantus today due to persistent hyperglycemia.  Chronic pain/neuropathy: Continue Lyrica      DVT  prophylaxis: Lovenox Code Status: Full Family Communication: None present at the bedside Disposition Plan: Likely home tomorrow   Consultants: None  Procedures: None  Antimicrobials: Azithromycin  Subjective: Patient seen and examined the bedside this morning.  Remains comfortable.  No wheezes today.  On bronchodilators.  Objective: Vitals:   07/03/18 2039 07/04/18 0531 07/04/18 0808 07/04/18 1209  BP: 106/71 117/65    Pulse: (!) 101 81 83   Resp: 20 (!) 21 18   Temp: 98.8 F (37.1 C) 97.6 F (36.4 C)    TempSrc: Oral Oral    SpO2: 91% 94% 93% 95%    Intake/Output Summary (Last 24 hours) at 07/04/2018 1241 Last data filed at 07/04/2018 0900 Gross per 24 hour  Intake 600 ml  Output -  Net 600 ml   There were no vitals filed for this visit.  Examination:  General exam: Appears calm and comfortable ,Not in distress,obese HEENT:PERRL,Oral mucosa moist, Ear/Nose normal on gross exam Respiratory system: Bilateral decreased air entry  cardiovascular system: S1 & S2 heard, RRR. No JVD, murmurs, rubs, gallops or clicks. No pedal edema. Gastrointestinal system: Abdomen is nondistended, soft and nontender. No organomegaly or masses felt. Normal bowel sounds heard. Central nervous system: Alert and oriented. No focal neurological deficits. Extremities: No edema, no clubbing ,no cyanosis, distal peripheral pulses palpable. Skin: No rashes, lesions or ulcers,no icterus ,no pallor MSK: Normal muscle bulk,tone ,power Psychiatry: Judgement and insight appear normal. Mood & affect appropriate.     Data Reviewed: I have personally reviewed following labs and imaging studies  CBC: Recent Labs  Lab 07/03/18 0649 07/04/18 0549  WBC 5.1 11.5*  NEUTROABS 4.2  --  HGB 13.8 13.8  HCT 42.4 43.2  MCV 102.4* 103.1*  PLT 93* 127*   Basic Metabolic Panel: Recent Labs  Lab 07/03/18 0649 07/04/18 0549  NA 136 137  K 3.7 4.4  CL 89* 86*  CO2 35* 39*  GLUCOSE 285* 354*  BUN 6  13  CREATININE 0.56 0.55  CALCIUM 9.8 9.8   GFR: CrCl cannot be calculated (Unknown ideal weight.). Liver Function Tests: No results for input(s): AST, ALT, ALKPHOS, BILITOT, PROT, ALBUMIN in the last 168 hours. No results for input(s): LIPASE, AMYLASE in the last 168 hours. No results for input(s): AMMONIA in the last 168 hours. Coagulation Profile: No results for input(s): INR, PROTIME in the last 168 hours. Cardiac Enzymes: No results for input(s): CKTOTAL, CKMB, CKMBINDEX, TROPONINI in the last 168 hours. BNP (last 3 results) No results for input(s): PROBNP in the last 8760 hours. HbA1C: No results for input(s): HGBA1C in the last 72 hours. CBG: Recent Labs  Lab 07/03/18 1202 07/03/18 1620 07/03/18 2037 07/04/18 0742 07/04/18 1124  GLUCAP 403* 406* 458* 406* 377*   Lipid Profile: No results for input(s): CHOL, HDL, LDLCALC, TRIG, CHOLHDL, LDLDIRECT in the last 72 hours. Thyroid Function Tests: No results for input(s): TSH, T4TOTAL, FREET4, T3FREE, THYROIDAB in the last 72 hours. Anemia Panel: No results for input(s): VITAMINB12, FOLATE, FERRITIN, TIBC, IRON, RETICCTPCT in the last 72 hours. Sepsis Labs: No results for input(s): PROCALCITON, LATICACIDVEN in the last 168 hours.  No results found for this or any previous visit (from the past 240 hour(s)).       Radiology Studies: Dg Chest 2 View  Result Date: 07/03/2018 CLINICAL DATA:  Shortness of breath.  Hypoxia.  History of COPD. EXAM: CHEST - 2 VIEW COMPARISON:  Chest radiograph May 12, 2018 FINDINGS: Cardiac silhouette is mildly enlarged unchanged. Coronary artery stent versus calcification. Increased lung volumes. LEFT lung base scarring without pleural effusion or focal consolidation. No pneumothorax. Large body habitus. Osseous structures are non suspicious. IMPRESSION: 1. Stable cardiomegaly. 2. stable hyperinflation with LEFT lung base scarring Electronically Signed   By: Awilda Metro M.D.   On:  07/03/2018 06:49        Scheduled Meds: . azithromycin  250 mg Oral Daily  . enoxaparin (LOVENOX) injection  40 mg Subcutaneous Q24H  . gabapentin  600 mg Oral TID  . guaiFENesin  600 mg Oral BID  . hydrocerin   Topical BID  . insulin aspart  0-20 Units Subcutaneous TID WC  . insulin aspart  0-5 Units Subcutaneous QHS  . insulin aspart  5 Units Subcutaneous TID WC  . insulin glargine  40 Units Subcutaneous QHS  . ipratropium-albuterol  3 mL Nebulization QID  . pantoprazole  40 mg Oral BID AC  . polyethylene glycol  17 g Oral Daily  . pregabalin  50 mg Oral BID  . sodium chloride flush  3 mL Intravenous Q12H  . torsemide  20 mg Oral Daily  . traMADol  50 mg Oral Q8H   Continuous Infusions: . sodium chloride       LOS: 1 day    Time spent: 25 mins.More than 50% of that time was spent in counseling and/or coordination of care.      Burnadette Pop, MD [facility] Pager [phone]  If 7PM-7AM, please contact night-coverage [email or web address] Password [email or web address] 07/04/2018, 12:41 PM

## 2018-07-05 DIAGNOSIS — J441 Chronic obstructive pulmonary disease with (acute) exacerbation: Secondary | ICD-10-CM | POA: Diagnosis not present

## 2018-07-05 LAB — GLUCOSE, CAPILLARY
Glucose-Capillary: 190 mg/dL — ABNORMAL HIGH (ref 70–99)
Glucose-Capillary: 271 mg/dL — ABNORMAL HIGH (ref 70–99)
Glucose-Capillary: 412 mg/dL — ABNORMAL HIGH (ref 70–99)
Glucose-Capillary: 420 mg/dL — ABNORMAL HIGH (ref 70–99)

## 2018-07-05 MED ORDER — PREDNISONE 50 MG PO TABS
50.0000 mg | ORAL_TABLET | Freq: Every day | ORAL | Status: DC
  Administered 2018-07-05: 50 mg via ORAL
  Filled 2018-07-05: qty 1

## 2018-07-05 MED ORDER — INSULIN ASPART 100 UNIT/ML ~~LOC~~ SOLN
7.0000 [IU] | Freq: Once | SUBCUTANEOUS | Status: AC
  Administered 2018-07-05: 7 [IU] via SUBCUTANEOUS

## 2018-07-05 MED ORDER — GUAIFENESIN ER 600 MG PO TB12
1200.0000 mg | ORAL_TABLET | Freq: Two times a day (BID) | ORAL | Status: DC
  Administered 2018-07-05 – 2018-07-06 (×2): 1200 mg via ORAL
  Filled 2018-07-05 (×2): qty 2

## 2018-07-05 MED ORDER — GUAIFENESIN-DM 100-10 MG/5ML PO SYRP
10.0000 mL | ORAL_SOLUTION | ORAL | Status: DC | PRN
  Administered 2018-07-05 – 2018-07-06 (×3): 10 mL via ORAL
  Filled 2018-07-05 (×3): qty 10

## 2018-07-05 NOTE — Progress Notes (Signed)
PROGRESS NOTE    Mary [name]  ZOX:[id number] DOB: [birth year] DOA: 07/03/2018 PCP: Corwin Levins, MD   Brief Narrative: Patient is a 42 year old female with past medical history of COPD on 3 L oxygen at home, OSA/OHS on BiPAP at home, diabetes mellitus, pulmonary hypertension, history of DVT, chronic pain syndrome who presented to the emergency department with complaints of shortness of breath and increased oxygen requirement.  Patient has multiple admissions lately for the same complaint.  Patient was admitted for the management of COPD exacerbation.  Assessment & Plan:   Principal Problem:   COPD exacerbation (HCC) Active Problems:   OSA on BiPAP   Pulmonary hypertension (HCC)   Obesity hypoventilation syndrome (HCC)   Morbid obesity (HCC)   Steroid-induced diabetes (HCC)  Acute respiratory failure with hypoxia: Secondary from COPD exacerbation.  Continue supplemental oxygen ,bronchodilators ,respiratory status stable now.  COPD exacerbation: Has several admissions lately.  History of smoking but has quit.  On oxygen at 3 L/min at home.  Follows with pulmonology, Ramaswamy.  Takes Symbicort and Spiriva at home.  Will ensure that patient is taking her inhalers in a correct way.  Respiratory therapist educated her on that.  Started her on prednsione.  Patient does not have any no wheezing this morning.  Continue Mucinex, Z-Pak.  History of OSA/obesity hypoventilation syndrome: On BiPAP at home night.  History of pulmonary hypertension/chronic diastolic CHF: Continue Demadex.  Morbid obesity: She has been counseled on the importance of diet and exercise.  Possible follow-up at [facility] obesity clinic.  Diabetes type 2: Most likely steroid-induced.  Last hemoglobin A1c of 6.7 on 02/24/18.  Currently on Lantus and sliding scale insulin.    Chronic pain/neuropathy: Continue Lyrica   DVT prophylaxis: Lovenox Code Status: Full Family Communication: None present at the  bedside Disposition Plan: Likely home tomorrow   Consultants: None  Procedures: None  Antimicrobials: Azithromycin  Subjective: Patient seen and examined the bedside this morning.  Feels better today.No new issues. Has bothering cough.  Objective: Vitals:   07/04/18 2006 07/04/18 2122 07/05/18 0536 07/05/18 0828  BP:  138/74 94/61   Pulse: 90 92 78   Resp: 18 18    Temp:  98.4 F (36.9 C) 97.6 F (36.4 C)   TempSrc:  Oral Oral   SpO2: 96% 93% 94% 95%    Intake/Output Summary (Last 24 hours) at 07/05/2018 1314 Last data filed at 07/04/2018 1700 Gross per 24 hour  Intake 480 ml  Output -  Net 480 ml   There were no vitals filed for this visit.  Examination:  General exam: Appears calm and comfortable ,Not in distress,obese HEENT:PERRL,Oral mucosa moist, Ear/Nose normal on gross exam Respiratory system: Bilateral decreased air entry Cardiovascular system: S1 & S2 heard, RRR. No JVD, murmurs, rubs, gallops or clicks. Gastrointestinal system: Abdomen is nondistended, soft and nontender. No organomegaly or masses felt. Normal bowel sounds heard. Central nervous system: Alert and oriented. No focal neurological deficits. Extremities: No edema, no clubbing ,no cyanosis, distal peripheral pulses palpable. Skin: No rashes, lesions or ulcers,no icterus ,no pallor MSK: Normal muscle bulk,tone ,power Psychiatry: Judgement and insight appear normal. Mood & affect appropriate.       Data Reviewed: I have personally reviewed following labs and imaging studies  CBC: Recent Labs  Lab 07/03/18 0649 07/04/18 0549  WBC 5.1 11.5*  NEUTROABS 4.2  --   HGB 13.8 13.8  HCT 42.4 43.2  MCV 102.4* 103.1*  PLT 93* 127*   Basic  Metabolic Panel: Recent Labs  Lab 07/03/18 0649 07/04/18 0549  NA 136 137  K 3.7 4.4  CL 89* 86*  CO2 35* 39*  GLUCOSE 285* 354*  BUN 6 13  CREATININE 0.56 0.55  CALCIUM 9.8 9.8   GFR: CrCl cannot be calculated (Unknown ideal weight.). Liver  Function Tests: No results for input(s): AST, ALT, ALKPHOS, BILITOT, PROT, ALBUMIN in the last 168 hours. No results for input(s): LIPASE, AMYLASE in the last 168 hours. No results for input(s): AMMONIA in the last 168 hours. Coagulation Profile: No results for input(s): INR, PROTIME in the last 168 hours. Cardiac Enzymes: No results for input(s): CKTOTAL, CKMB, CKMBINDEX, TROPONINI in the last 168 hours. BNP (last 3 results) No results for input(s): PROBNP in the last 8760 hours. HbA1C: No results for input(s): HGBA1C in the last 72 hours. CBG: Recent Labs  Lab 07/04/18 1124 07/04/18 1703 07/04/18 2127 07/05/18 0734 07/05/18 1136  GLUCAP 377* 294* 393* 190* 271*   Lipid Profile: No results for input(s): CHOL, HDL, LDLCALC, TRIG, CHOLHDL, LDLDIRECT in the last 72 hours. Thyroid Function Tests: No results for input(s): TSH, T4TOTAL, FREET4, T3FREE, THYROIDAB in the last 72 hours. Anemia Panel: No results for input(s): VITAMINB12, FOLATE, FERRITIN, TIBC, IRON, RETICCTPCT in the last 72 hours. Sepsis Labs: No results for input(s): PROCALCITON, LATICACIDVEN in the last 168 hours.  No results found for this or any previous visit (from the past 240 hour(s)).       Radiology Studies: No results found.      Scheduled Meds: . azithromycin  250 mg Oral Daily  . enoxaparin (LOVENOX) injection  40 mg Subcutaneous Q24H  . gabapentin  600 mg Oral TID  . guaiFENesin  1,200 mg Oral BID  . hydrocerin   Topical BID  . insulin aspart  0-20 Units Subcutaneous TID WC  . insulin aspart  0-5 Units Subcutaneous QHS  . insulin aspart  5 Units Subcutaneous TID WC  . insulin glargine  40 Units Subcutaneous QHS  . ipratropium-albuterol  3 mL Nebulization QID  . pantoprazole  40 mg Oral BID AC  . polyethylene glycol  17 g Oral Daily  . predniSONE  50 mg Oral Q breakfast  . pregabalin  50 mg Oral BID  . sodium chloride flush  3 mL Intravenous Q12H  . torsemide  20 mg Oral Daily  .  traMADol  50 mg Oral Q8H   Continuous Infusions: . sodium chloride       LOS: 1 day    Time spent: 25 mins.More than 50% of that time was spent in counseling and/or coordination of care.      Burnadette PopAmrit Jalal Rauch, MD [facility] Pager [phone]  If 7PM-7AM, please contact night-coverage [email or web address] Password [email or web address] 07/05/2018, 1:14 PM

## 2018-07-05 NOTE — Progress Notes (Signed)
Pt. seen for scheduled nebulizer tx., was wearing BiPAP, which was taken off, nebulizer given with oxygen, replaced BiPAP afterwards, humidifier refilled before placing back on, was tolerating well, made aware to notify if needed.

## 2018-07-06 DIAGNOSIS — J441 Chronic obstructive pulmonary disease with (acute) exacerbation: Secondary | ICD-10-CM | POA: Diagnosis not present

## 2018-07-06 LAB — GLUCOSE, CAPILLARY
GLUCOSE-CAPILLARY: 174 mg/dL — AB (ref 70–99)
Glucose-Capillary: 363 mg/dL — ABNORMAL HIGH (ref 70–99)
Glucose-Capillary: 338 mg/dL — ABNORMAL HIGH (ref 70–99)

## 2018-07-06 MED ORDER — CHLORHEXIDINE GLUCONATE 0.12 % MT SOLN: 15.0000 mL | Freq: Four times a day (QID) | OROMUCOSAL | 0 refills | Status: DC

## 2018-07-06 MED ORDER — LIDOCAINE VISCOUS HCL 2 % MT SOLN
15.0000 mL | Freq: Four times a day (QID) | OROMUCOSAL | Status: DC | PRN
Start: 2018-07-06 — End: 2018-07-06
  Filled 2018-07-06: qty 15

## 2018-07-06 MED ORDER — CHLORHEXIDINE GLUCONATE 0.12 % MT SOLN
15.0000 mL | Freq: Four times a day (QID) | OROMUCOSAL | Status: DC
  Administered 2018-07-06 (×2): 15 mL via OROMUCOSAL
  Filled 2018-07-06 (×2): qty 15

## 2018-07-06 MED ORDER — PREDNISONE 10 MG PO TABS: ORAL_TABLET | ORAL | 0 refills | Status: DC

## 2018-07-06 MED ORDER — AZITHROMYCIN 250 MG PO TABS: ORAL_TABLET | ORAL | 0 refills | Status: DC

## 2018-07-06 MED ORDER — LIDOCAINE VISCOUS HCL 2 % MT SOLN: 15.0000 mL | Freq: Four times a day (QID) | OROMUCOSAL | 0 refills | Status: DC | PRN

## 2018-07-06 MED ORDER — GUAIFENESIN-DM 100-10 MG/5ML PO SYRP: 10.0000 mL | ORAL_SOLUTION | ORAL | 0 refills | Status: DC | PRN

## 2018-07-06 NOTE — Discharge Summary (Addendum)
Physician Discharge Summary  Mary [name]:[id number] DOB: Feb 21, 1976 DOA: 07/03/2018  PCP: Corwin Levins, MD  Admit date: 07/03/2018 Discharge date: 07/06/2018  Admitted From: Home Disposition:  Home  Discharge Condition:Stable CODE STATUS:FULL Diet recommendation: Heart Healthy  Brief/Interim Summary: Patient is a 42 year old female with past medical history of COPD on 3 L oxygen at home, OSA/OHS on BiPAP at home, diabetes mellitus, pulmonary hypertension, history of DVT, chronic pain syndrome who presented to the emergency department with complaints of shortness of breath and increased oxygen requirement.  Patient has multiple admissions lately for the same complaint.  Patient was admitted for the management of COPD exacerbation.  Patient was started on IV steroids, antibiotics.  Patient's hospital course was remarkable for persistent hyperglycemia secondary to steroids.  Her respiratory status gradually improved.  We have revised/discussed with her the appropriate way of taking the inhalers.  She is stable for discharge to home today.  She will follow-up with her pulmonologist, Dr. Marchelle Gearing as an outpatient.  Following problems were addressed during her hospitalization:  Acute respiratory failure with hypoxia: Secondary from COPD exacerbation.  Continue supplemental oxygen ,bronchodilators at home.Respiratory status stable now.  COPD exacerbation: Has several admissions lately.  History of smoking but has quit.  On oxygen at 3 L/min at home.  Follows with pulmonology, Ramaswamy.  Takes Symbicort and Spiriva at home.  Will have ensured that patient is taking her inhalers in a correct way.  Respiratory therapist educated her on that.    Will discharge her on small dose of tapering prednisone. Patient does not have any no wheezing this morning. Continue Mucinex, Z-Pak.  History of OSA/obesity hypoventilation syndrome: On BiPAP at home night.  History of pulmonary hypertension/chronic  diastolic CHF: Continue Demadex.  Morbid obesity: She has been counseled on the importance of diet and exercise. Possible follow-up at [facility] obesity clinic.  Diabetes type 2: Most likely steroid-induced.  Last hemoglobin A1c of 6.7 on 02/24/18.  Continue her home insulin regimen on discharge.   Chronic pain/neuropathy: Continue Lyrica     Discharge Diagnoses:  Principal Problem:   COPD exacerbation (HCC) Active Problems:   OSA on BiPAP   Pulmonary hypertension (HCC)   Obesity hypoventilation syndrome (HCC)   Morbid obesity (HCC)   Steroid-induced diabetes [facility])    Discharge Instructions  Discharge Instructions    Ambulatory referral to Pulmonology   Complete by:  As directed    Follow up in 2 weeks   Diet - low sodium heart healthy   Complete by:  As directed    Discharge instructions   Complete by:  As directed    1)Follow up with your PCP in a week. 2) Follow up with your pulmonologist in 2 weeks. 3)Continue taking your home medications.Take prescribed medications as instructed.   Increase activity slowly   Complete by:  As directed      Allergies as of 07/06/2018      Reactions   Bee Venom Swelling   Pt states she swells up 3x normal size. If stung "2-3 times, my throat could close up"      Medication List    STOP taking these medications   guaiFENesin-codeine 100-10 MG/5ML syrup   NOVOLOG FLEXPEN 100 UNIT/ML FlexPen Generic drug:  insulin aspart     TAKE these medications   albuterol 108 (90 Base) MCG/ACT inhaler Commonly known as:  PROVENTIL HFA;VENTOLIN HFA USE 2 PUFFS EVERY 6 HOURS AS NEEDED FOR WHEEZING   azithromycin 250 MG tablet Commonly known  as:  ZITHROMAX Take one tablet daily for 2 days Start taking on:  07/07/2018   BASAGLAR KWIKPEN 100 UNIT/ML Sopn Inject 0.25 mLs (25 Units total) into the skin daily at 10 pm. E11.9   budesonide-formoterol 160-4.5 MCG/ACT inhaler Commonly known as:  SYMBICORT Inhale 2 puffs into the lungs daily.    chlorhexidine 0.12 % solution Commonly known as:  PERIDEX Use as directed 15 mLs in the mouth or throat 4 (four) times daily.   gabapentin 300 MG capsule Commonly known as:  NEURONTIN TAKE 2 CAPSULES (600 MG TOTAL) BY MOUTH 3 (THREE) TIMES DAILY.   glucose blood test strip Commonly known as:  ONE TOUCH ULTRA TEST Use as instructed four times per day E11.9   guaiFENesin-dextromethorphan 100-10 MG/5ML syrup Commonly known as:  ROBITUSSIN DM Take 10 mLs by mouth every 4 (four) hours as needed for cough.   HUMALOG KWIKPEN 100 UNIT/ML KiwkPen Generic drug:  insulin lispro Inject 15 mg into the skin 3 (three) times daily with meals.   Insulin Pen Needle 32G X 4 MM Misc Commonly known as:  BD PEN NEEDLE NANO U/F Use as directed four times per day daily E11.9   lidocaine 2 % solution Commonly known as:  XYLOCAINE Use as directed 15 mLs in the mouth or throat every 6 (six) hours as needed for mouth pain.   pantoprazole 40 MG tablet Commonly known as:  PROTONIX Take 1 tablet (40 mg total) by mouth 2 (two) times daily before a meal.   predniSONE 10 MG tablet Commonly known as:  DELTASONE Take 3 pills daily for 3 days then 1 pill daily for 3 days then stop What changed:    medication strength  how much to take  how to take this  when to take this  additional instructions   pregabalin 50 MG capsule Commonly known as:  LYRICA Take 1 capsule (50 mg total) by mouth 2 (two) times daily.   saccharomyces boulardii 250 MG capsule Commonly known as:  FLORASTOR Take 1 capsule (250 mg total) by mouth 2 (two) times daily.   SPIRIVA RESPIMAT 1.25 MCG/ACT Aers Generic drug:  Tiotropium Bromide Monohydrate INHALE 2 PUFFS INTO THE LUNGS DAILY.   torsemide 20 MG tablet Commonly known as:  DEMADEX TAKE 1 TABLET BY MOUTH EVERY DAY   traMADol 50 MG tablet Commonly known as:  ULTRAM TAKE 1 TABLET BY MOUTH EVERY 8 HOURS AS NEEDED FOR SEVERE PAIN      Follow-up Information     Corwin LevinsJohn, [name], MD. Schedule an appointment as soon as possible for a visit in 1 week(s).   Specialties:  Internal Medicine, Radiology Contact information: [address] [facility] [address] [phone]        Kalman Shanamaswamy, Murali, MD. Schedule an appointment as soon as possible for a visit in 2 week(s).   Specialty:  Pulmonary Disease Contact information: [address] [place] [address] [phone]          Allergies  Allergen Reactions  . Bee Venom Swelling    Pt states she swells up 3x normal size. If stung "2-3 times, my throat could close up"    Consultations:  None   Procedures/Studies: Dg Chest 2 View  Result Date: 07/03/2018 CLINICAL DATA:  Shortness of breath.  Hypoxia.  History of COPD. EXAM: CHEST - 2 VIEW COMPARISON:  Chest radiograph May 12, 2018 FINDINGS: Cardiac silhouette is mildly enlarged unchanged. Coronary artery stent versus calcification. Increased lung volumes. LEFT  lung base scarring without pleural effusion or focal consolidation. No pneumothorax. Large body habitus. Osseous structures are non suspicious. IMPRESSION: 1. Stable cardiomegaly. 2. stable hyperinflation with LEFT lung base scarring Electronically Signed   By: Awilda Metro M.D.   On: 07/03/2018 06:49      Subjective: Patient seen and examined the bedside this morning.  Remains comfortable today.  Respiratory status much better.  She is stable for discharge home today.  Discharge Exam: Vitals:   07/06/18 0516 07/06/18 0816  BP: 115/64   Pulse: 82   Resp: 19   Temp: (!) 97.5 F (36.4 C)   SpO2: 91% 95%   Vitals:   07/05/18 1953 07/05/18 1953 07/06/18 0516 07/06/18 0816  BP:  122/75 115/64   Pulse:  80 82   Resp:   19   Temp:  (!) 97.4 F (36.3 C) (!) 97.5 F (36.4 C)   TempSrc:      SpO2: 99% 99% 91% 95%    General: Pt is alert, awake, not in acute distress Cardiovascular: RRR, S1/S2 +, no rubs, no gallops Respiratory: Decreased air entry  bilaterally Abdominal: Soft, NT, ND, bowel sounds + Extremities: no edema, no cyanosis    The results of significant diagnostics from this hospitalization (including imaging, microbiology, ancillary and laboratory) are listed below for reference.     Microbiology: No results found for this or any previous visit (from the past 240 hour(s)).   Labs: BNP (last 3 results) Recent Labs    01/11/18 0526 02/08/18 1616 05/12/18 0754  BNP 45.9 40.9 64.2   Basic Metabolic Panel: Recent Labs  Lab 07/03/18 0649 07/04/18 0549  NA 136 137  K 3.7 4.4  CL 89* 86*  CO2 35* 39*  GLUCOSE 285* 354*  BUN 6 13  CREATININE 0.56 0.55  CALCIUM 9.8 9.8   Liver Function Tests: No results for input(s): AST, ALT, ALKPHOS, BILITOT, PROT, ALBUMIN in the last 168 hours. No results for input(s): LIPASE, AMYLASE in the last 168 hours. No results for input(s): AMMONIA in the last 168 hours. CBC: Recent Labs  Lab 07/03/18 0649 07/04/18 0549  WBC 5.1 11.5*  NEUTROABS 4.2  --   HGB 13.8 13.8  HCT 42.4 43.2  MCV 102.4* 103.1*  PLT 93* 127*   Cardiac Enzymes: No results for input(s): CKTOTAL, CKMB, CKMBINDEX, TROPONINI in the last 168 hours. BNP: Invalid input(s): POCBNP CBG: Recent Labs  Lab 07/05/18 0734 07/05/18 1136 07/05/18 1636 07/05/18 2019 07/06/18 0800  GLUCAP 190* 271* 412* 420* 363*   D-Dimer No results for input(s): DDIMER in the last 72 hours. Hgb A1c No results for input(s): HGBA1C in the last 72 hours. Lipid Profile No results for input(s): CHOL, HDL, LDLCALC, TRIG, CHOLHDL, LDLDIRECT in the last 72 hours. Thyroid function studies No results for input(s): TSH, T4TOTAL, T3FREE, THYROIDAB in the last 72 hours.  Invalid input(s): FREET3 Anemia work up No results for input(s): VITAMINB12, FOLATE, FERRITIN, TIBC, IRON, RETICCTPCT in the last 72 hours. Urinalysis    Component Value Date/Time   COLORURINE YELLOW 05/12/2018 1112   APPEARANCEUR HAZY (A) 05/12/2018 1112    LABSPEC 1.030 05/12/2018 1112   PHURINE 6.0 05/12/2018 1112   GLUCOSEU >=500 (A) 05/12/2018 1112   GLUCOSEU 100 (A) 02/24/2018 1644   HGBUR NEGATIVE 05/12/2018 1112   BILIRUBINUR NEGATIVE 05/12/2018 1112   KETONESUR 5 (A) 05/12/2018 1112   PROTEINUR NEGATIVE 05/12/2018 1112   UROBILINOGEN 2.0 (A) 02/24/2018 1644   NITRITE NEGATIVE 05/12/2018 1112  LEUKOCYTESUR MODERATE (A) 05/12/2018 1112   Sepsis Labs Invalid input(s): PROCALCITONIN,  WBC,  LACTICIDVEN Microbiology No results found for this or any previous visit (from the past 240 hour(s)).  Please note: You were cared for by a hospitalist during your hospital stay. Once you are discharged, your primary care physician will handle any further medical issues. Please note that NO REFILLS for any discharge medications will be authorized once you are discharged, as it is imperative that you return to your primary care physician (or establish a relationship with a primary care physician if you do not have one) for your post hospital discharge needs so that they can reassess your need for medications and monitor your lab values.    Time coordinating discharge: 40 minutes  SIGNED:   Burnadette Pop, MD  [facility] 07/06/2018, 11:30 AM Pager [phone]  If 7PM-7AM, please contact night-coverage [email or web address] Password [email or web address]

## 2018-07-06 NOTE — Progress Notes (Signed)
PT demonstrated hands on understanding of Flutter device. 

## 2018-07-09 DIAGNOSIS — J449 Chronic obstructive pulmonary disease, unspecified: Secondary | ICD-10-CM | POA: Diagnosis not present

## 2018-07-09 DIAGNOSIS — I270 Primary pulmonary hypertension: Secondary | ICD-10-CM | POA: Diagnosis not present

## 2018-07-09 DIAGNOSIS — G4733 Obstructive sleep apnea (adult) (pediatric): Secondary | ICD-10-CM | POA: Diagnosis not present

## 2018-07-11 DIAGNOSIS — J449 Chronic obstructive pulmonary disease, unspecified: Secondary | ICD-10-CM | POA: Diagnosis not present

## 2018-07-11 DIAGNOSIS — I270 Primary pulmonary hypertension: Secondary | ICD-10-CM | POA: Diagnosis not present

## 2018-07-11 DIAGNOSIS — G4733 Obstructive sleep apnea (adult) (pediatric): Secondary | ICD-10-CM | POA: Diagnosis not present

## 2018-08-09 DIAGNOSIS — G4733 Obstructive sleep apnea (adult) (pediatric): Secondary | ICD-10-CM | POA: Diagnosis not present

## 2018-08-09 DIAGNOSIS — I270 Primary pulmonary hypertension: Secondary | ICD-10-CM | POA: Diagnosis not present

## 2018-08-09 DIAGNOSIS — J449 Chronic obstructive pulmonary disease, unspecified: Secondary | ICD-10-CM | POA: Diagnosis not present

## 2018-08-11 DIAGNOSIS — I270 Primary pulmonary hypertension: Secondary | ICD-10-CM | POA: Diagnosis not present

## 2018-08-11 DIAGNOSIS — G4733 Obstructive sleep apnea (adult) (pediatric): Secondary | ICD-10-CM | POA: Diagnosis not present

## 2018-08-11 DIAGNOSIS — J449 Chronic obstructive pulmonary disease, unspecified: Secondary | ICD-10-CM | POA: Diagnosis not present

## 2018-08-23 ENCOUNTER — Other Ambulatory Visit: Payer: Self-pay | Admitting: Internal Medicine

## 2018-08-29 ENCOUNTER — Other Ambulatory Visit: Payer: Self-pay | Admitting: Internal Medicine

## 2018-08-30 ENCOUNTER — Encounter: Payer: Self-pay | Admitting: Internal Medicine

## 2018-08-30 ENCOUNTER — Ambulatory Visit (INDEPENDENT_AMBULATORY_CARE_PROVIDER_SITE_OTHER): Payer: 59 | Admitting: Internal Medicine

## 2018-08-30 ENCOUNTER — Other Ambulatory Visit (INDEPENDENT_AMBULATORY_CARE_PROVIDER_SITE_OTHER): Payer: 59

## 2018-08-30 ENCOUNTER — Other Ambulatory Visit: Payer: Self-pay | Admitting: Internal Medicine

## 2018-08-30 VITALS — BP 100/60 | HR 87 | Temp 98.3°F | Ht 62.0 in | Wt 197.0 lb

## 2018-08-30 DIAGNOSIS — E669 Obesity, unspecified: Secondary | ICD-10-CM | POA: Diagnosis not present

## 2018-08-30 DIAGNOSIS — I5032 Chronic diastolic (congestive) heart failure: Secondary | ICD-10-CM | POA: Diagnosis not present

## 2018-08-30 DIAGNOSIS — J449 Chronic obstructive pulmonary disease, unspecified: Secondary | ICD-10-CM

## 2018-08-30 DIAGNOSIS — G8929 Other chronic pain: Secondary | ICD-10-CM

## 2018-08-30 DIAGNOSIS — Z23 Encounter for immunization: Secondary | ICD-10-CM | POA: Diagnosis not present

## 2018-08-30 DIAGNOSIS — E1169 Type 2 diabetes mellitus with other specified complication: Secondary | ICD-10-CM

## 2018-08-30 LAB — HEPATIC FUNCTION PANEL
ALK PHOS: 158 U/L — AB (ref 39–117)
ALT: 42 U/L — ABNORMAL HIGH (ref 0–35)
AST: 131 U/L — AB (ref 0–37)
Albumin: 4.2 g/dL (ref 3.5–5.2)
Bilirubin, Direct: 0.7 mg/dL — ABNORMAL HIGH (ref 0.0–0.3)
TOTAL PROTEIN: 8 g/dL (ref 6.0–8.3)
Total Bilirubin: 2.1 mg/dL — ABNORMAL HIGH (ref 0.2–1.2)

## 2018-08-30 LAB — BASIC METABOLIC PANEL
BUN: 9 mg/dL (ref 6–23)
CALCIUM: 9.4 mg/dL (ref 8.4–10.5)
CO2: 42 meq/L — AB (ref 19–32)
CREATININE: 0.67 mg/dL (ref 0.40–1.20)
Chloride: 86 mEq/L — ABNORMAL LOW (ref 96–112)
GFR: 103.16 mL/min (ref >60.00)
Glucose, Bld: 266 mg/dL — ABNORMAL HIGH (ref 70–99)
Potassium: 4.1 mEq/L (ref 3.5–5.1)
Sodium: 135 mEq/L (ref 135–145)

## 2018-08-30 LAB — LIPID PANEL
CHOLESTEROL: 232 mg/dL — AB (ref 0–200)
HDL: 24.5 mg/dL — ABNORMAL LOW (ref >39.00)
LDL Cholesterol: 169 mg/dL — ABNORMAL HIGH (ref 0–99)
NonHDL: 207.21
TRIGLYCERIDES: 193 mg/dL — AB (ref 0.0–149.0)
Total CHOL/HDL Ratio: 9
VLDL: 38.6 mg/dL (ref 0.0–40.0)

## 2018-08-30 LAB — HEMOGLOBIN A1C: HEMOGLOBIN A1C: 6.5 % (ref 4.6–6.5)

## 2018-08-30 MED ORDER — TRAMADOL HCL 50 MG PO TABS: 50.0000 mg | ORAL_TABLET | Freq: Four times a day (QID) | ORAL | 2 refills | Status: DC | PRN

## 2018-08-30 MED ORDER — ATORVASTATIN CALCIUM 40 MG PO TABS: 40.0000 mg | ORAL_TABLET | Freq: Every day | ORAL | 3 refills | Status: DC

## 2018-08-30 NOTE — Assessment & Plan Note (Signed)
stable overall by history and exam, recent data reviewed with pt, and pt to continue medical treatment as before,  to f/u any worsening symptoms or concerns, for f/u lab today 

## 2018-08-30 NOTE — Addendum Note (Signed)
Addended by: Berton Lan R on: 08/30/2018 01:58 PM   Modules accepted: Orders

## 2018-08-30 NOTE — Telephone Encounter (Signed)
MD approved and sent electronically to pof../lmb  

## 2018-08-30 NOTE — Telephone Encounter (Signed)
Done erx 

## 2018-08-30 NOTE — Progress Notes (Signed)
Subjective:    Patient ID: Mary [name], female    DOB: Aug 08, 1976, 42 y.o.   MRN: [id number]  HPI  Here to f/u; overall doing ok,  Pt denies chest pain, increasing sob or doe, wheezing, orthopnea, PND, increased LE swelling, palpitations, dizziness or syncope.  Pt denies new neurological symptoms such as new headache, or facial or extremity weakness or numbness.  Pt denies polydipsia, polyuria, or low sugar episode.  Pt states overall good compliance with meds, mostly trying to follow appropriate diet, with wt overall stable,  but little exercise however. Has some nausea this am without vomiting or ABD pain.   Pt denies fever, wt loss, night sweats, loss of appetite, or other constitutional symptoms   States very good compliance with night time Bipap Past Medical History:  Diagnosis Date  . Asthma   . Axillary adenopathy    right axillary adenopathy noted on CT chest (03/06/2012)  . Chronic right-sided CHF (congestive heart failure) (HCC) 03/23/2012   with cor pulmonale. Last RHC 03/2012  . COPD (chronic obstructive pulmonary disease) (HCC)    PFTS 03/08/12: fev1 0.58L/1%, FVC 1.18/33%, Ratop 49 and c/w ssevere obstruction. 21% BD response on FVC, RV 219%, DLCO 11/54%  . Diabetes mellitus without complication (HCC)   . DVT of upper extremity (deep vein thrombosis) [facility]) April 2013   right subclavian // Unclear precipitating cause - possibly significant right heart failure, with vascular stasis potentially predisposing to clotting.    . Exertional shortness of breath   . Hepatic cirrhosis (HCC)    Questionable history of - Noted on CT abdomen (03/2012) - thought to be due to vascular congestion from right heart failure +/- patient's history of alcohol abuse  . History of alcoholism (HCC)   . History of chronic bronchitis   . Irregular menses   . Migraine    "~ 1/yr" (05/03/2013)  . Moderate to severe pulmonary hypertension [facility]) April 2013   Cardiac cath on 03/06/12 - 1. Elevated pulmonary  artery pressures, right sided filling pressures.,  2. PA: 64/45 (mean 53)    . On home oxygen therapy    "2-3 L 24/7" (05/03/2013)  . OSA (obstructive sleep apnea)    wears noctural BiPAP (05/03/2013)  . Periodontal disease   . Pneumonia ~ 1985; 01/2011  . Pulmonary embolus [facility]) April 2013   Precipitating cause unclear. Was treated with coumadin from April-June 2013.  . Pulmonary hypertension (HCC)    Past Surgical History:  Procedure Laterality Date  . APPENDECTOMY  05/03/2013  . CARDIAC CATHETERIZATION  03/2012  . CARDIAC CATHETERIZATION N/A 01/08/2016   Procedure: Right Heart Cath;  Surgeon: Dolores Patty, MD;  Location: [facility] INVASIVE CV LAB;  Service: Cardiovascular;  Laterality: N/A;  . CARDIAC SURGERY  01-18-1977   "my heart was backwards" (05/03/2013)  . LAPAROSCOPIC APPENDECTOMY N/A 05/03/2013   Procedure: APPENDECTOMY LAPAROSCOPIC;  Surgeon: Cherylynn Ridges, MD;  Location: [facility] OR;  Service: General;  Laterality: N/A;  . LUNG SURGERY  20-Oct-1977  . RIGHT HEART CATHETERIZATION N/A 03/07/2012   Procedure: RIGHT HEART CATH;  Surgeon: Kathleene Hazel, MD;  Location: [facility] CATH LAB;  Service: Cardiovascular;  Laterality: N/A;    reports that she has been smoking cigarettes. She has a 6.25 pack-year smoking history. She has never used smokeless tobacco. She reports that she drank alcohol. She reports that she does not use drugs. family history includes Alcohol abuse in her father; Heart disease in her father; Hypertension in her father;  Other in her brother. Allergies  Allergen Reactions  . Bee Venom Swelling    Pt states she swells up 3x normal size. If stung "2-3 times, my throat could close up"   Current Outpatient Medications on File Prior to Visit  Medication Sig Dispense Refill  . albuterol (PROVENTIL HFA;VENTOLIN HFA) 108 (90 Base) MCG/ACT inhaler USE 2 PUFFS EVERY 6 HOURS AS NEEDED FOR WHEEZING 18 Inhaler 3  . budesonide-formoterol (SYMBICORT) 160-4.5 MCG/ACT inhaler Inhale 2  puffs into the lungs daily. 1 Inhaler 09  . gabapentin (NEURONTIN) 300 MG capsule TAKE 2 CAPSULES (600 MG TOTAL) BY MOUTH 3 (THREE) TIMES DAILY. 540 capsule 1  . glucose blood (ONE TOUCH ULTRA TEST) test strip Use as instructed four times per day E11.9 400 each 12  . HUMALOG KWIKPEN 100 UNIT/ML KiwkPen Inject 15 mg into the skin 3 (three) times daily with meals.  11  . Insulin Glargine (BASAGLAR KWIKPEN) 100 UNIT/ML SOPN Inject 0.25 mLs (25 Units total) into the skin daily at 10 pm. E11.9 5 pen 11  . Insulin Pen Needle (BD PEN NEEDLE NANO U/F) 32G X 4 MM MISC Use as directed four times per day daily E11.9 400 each 3  . pantoprazole (PROTONIX) 40 MG tablet Take 1 tablet (40 mg total) by mouth 2 (two) times daily before a meal. 60 tablet 0  . pregabalin (LYRICA) 50 MG capsule Take 1 capsule (50 mg total) by mouth 2 (two) times daily. 60 capsule 3  . SPIRIVA RESPIMAT 1.25 MCG/ACT AERS INHALE 2 PUFFS INTO THE LUNGS DAILY. 1 Inhaler 5  . torsemide (DEMADEX) 20 MG tablet TAKE 1 TABLET BY MOUTH EVERY DAY 90 tablet 0  . chlorhexidine (PERIDEX) 0.12 % solution Use as directed 15 mLs in the mouth or throat 4 (four) times daily. (Patient not taking: Reported on 08/30/2018) 120 mL 0  . lidocaine (XYLOCAINE) 2 % solution Use as directed 15 mLs in the mouth or throat every 6 (six) hours as needed for mouth pain. (Patient not taking: Reported on 08/30/2018) 100 mL 0   No current facility-administered medications on file prior to visit.    Review of Systems  Constitutional: Negative for other unusual diaphoresis or sweats HENT: Negative for ear discharge or swelling Eyes: Negative for other worsening visual disturbances Respiratory: Negative for stridor or other swelling  Gastrointestinal: Negative for worsening distension or other blood Genitourinary: Negative for retention or other urinary change Musculoskeletal: Negative for other MSK pain or swelling Skin: Negative for color change or other new  lesions Neurological: Negative for worsening tremors and other numbness  Psychiatric/Behavioral: Negative for worsening agitation or other fatigue All other system neg per pt    Objective:   Physical Exam BP 100/60 (BP Location: Left Arm, Patient Position: Sitting, Cuff Size: Normal)   Pulse 87   Temp 98.3 F (36.8 C) (Oral)   Ht 5\' 2"  (1.575 m)   Wt 197 lb (89.4 kg)   SpO2 91%   BMI 36.03 kg/m  VS noted, on home o2 Constitutional: Pt appears in NAD HENT: Head: NCAT.  Right Ear: External ear normal.  Left Ear: External ear normal.  Eyes: . Pupils are equal, round, and reactive to light. Conjunctivae and EOM are normal Nose: without d/c or deformity Neck: Neck supple. Gross normal ROM Cardiovascular: Normal rate and regular rhythm.   Pulmonary/Chest: Effort normal and breath sounds decreased without rales or wheezing.  Abd:  Soft, NT, ND, + BS, no organomegaly Neurological: Pt is alert. At  baseline orientation, motor grossly intact Skin: Skin is warm. No rashes, other new lesions, no LE edema Psychiatric: Pt behavior is normal without agitation  No other exam findings Lab Results  Component Value Date   WBC 11.5 (H) 07/04/2018   HGB 13.8 07/04/2018   HCT 43.2 07/04/2018   PLT 127 (L) 07/04/2018   GLUCOSE 354 (H) 07/04/2018   CHOL 232 (H) 02/24/2018   TRIG 131.0 02/24/2018   HDL 44.40 02/24/2018   LDLCALC 162 (H) 02/24/2018   ALT 123 (H) 05/12/2018   AST 82 (H) 05/12/2018   NA 137 07/04/2018   K 4.4 07/04/2018   CL 86 (L) 07/04/2018   CREATININE 0.55 07/04/2018   BUN 13 07/04/2018   CO2 39 (H) 07/04/2018   TSH 3.49 02/24/2018   INR 0.92 02/09/2017   HGBA1C 6.7 (H) 02/24/2018   MICROALBUR 1.4 02/24/2018       Assessment & Plan:

## 2018-08-30 NOTE — Assessment & Plan Note (Signed)
For tramadol prn refill,  to f/u any worsening symptoms or concerns

## 2018-08-30 NOTE — Patient Instructions (Addendum)
You had the flu shot today  Please continue all other medications as before, and refills have been done if requested - the tramadol  Please have the pharmacy call with any other refills you may need.  Please continue your efforts at being more active, low cholesterol diet, and weight control  Please keep your appointments with your specialists as you may have planned  Please go to the LAB in the Basement (turn left off the elevator) for the tests to be done today  You will be contacted by phone if any changes need to be made immediately.  Otherwise, you will receive a letter about your results with an explanation, but please check with MyChart first.  Please remember to sign up for MyChart if you have not done so, as this will be important to you in the future with finding out test results, communicating by private email, and scheduling acute appointments online when needed.  Please return in 6 months, or sooner if needed, with Lab testing done 3-5 days before

## 2018-08-30 NOTE — Assessment & Plan Note (Signed)
stable overall by history and exam, recent data reviewed with pt, and pt to continue medical treatment as before,  to f/u any worsening symptoms or concerns  

## 2018-09-04 ENCOUNTER — Ambulatory Visit: Payer: 59 | Admitting: Internal Medicine

## 2018-09-04 NOTE — Progress Notes (Deleted)
OV 09/07/2016  Chief Complaint  Patient presents with  . Follow-up    Pt has not seen ENT yet, due to canceling appt. Pt recently hospitalized for COPD from 9/20-9/23. Pt states her SOB is not back to baseline. Pt c/o prod cough with clear mucus and chest tightness.   42 year old female with cor pulmonale and advanced COPD with chronic hypercapnia. Presents for routine follow-up. Admitted again for CPD exacerbation. Unclear why she gets recurrent exacerbations but suspect home environment and underlying baseline very severe disease. She says she's compliant with oxygen and on the medications. She is frustrated that she got discharged early. She is chronic hoarseness of voice but has not admitted to the ENT appointment. She did meet with palliative care in the hospital and according to her she wants to think about this. She does understand the difference between home hospice versus home palliative care and she's not interested in home palliative care either at this point. She's not interested in chronic prednisone which we discussed. She says she's had a flu shot   OV 11/09/2016  Chief Complaint  Patient presents with  . Follow-up    6 week follow up. Breathing was ok until yesterday 11/08/16. Pt. felt SOB and chest tightness. Non productive cough.     Follow-up advanced COPD with cor pulmonale  This is a routine follow-up but for the last 3 days she's had increased exacerbation. She is unclear why. Worsening cough worsening wheeze mild yellow sputum. She feels fatigued. She said last night she was quite dyspneic that she wanted to go to the emergency department but opted to make this visit today.   OV 09/04/2018  Subjective:  Patient ID: Mary [name], female , DOB: [birth year] , age 50 y.o. , MRN: [id number] , ADDRESS: [address] [place] [address]   09/04/2018 -  No chief complaint on file.    HPI Mary [name] 42 y.o. -     ROS - per HPI     has a past  medical history of Asthma, Axillary adenopathy, Chronic right-sided CHF (congestive heart failure) (HCC) (03/23/2012), COPD (chronic obstructive pulmonary disease) (HCC), Diabetes mellitus without complication (HCC), DVT of upper extremity (deep vein thrombosis) [facility]) (April 2013), Exertional shortness of breath, Hepatic cirrhosis (HCC), History of alcoholism (HCC), History of chronic bronchitis, Irregular menses, Migraine, Moderate to severe pulmonary hypertension [facility]) (April 2013), On home oxygen therapy, OSA (obstructive sleep apnea), Periodontal disease, Pneumonia (~ 1985; 01/2011), Pulmonary embolus [facility]) (April 2013), and Pulmonary hypertension (HCC).   reports that she has been smoking cigarettes. She has a 6.25 pack-year smoking history. She has never used smokeless tobacco.  Past Surgical History:  Procedure Laterality Date  . APPENDECTOMY  05/03/2013  . CARDIAC CATHETERIZATION  03/2012  . CARDIAC CATHETERIZATION N/A 01/08/2016   Procedure: Right Heart Cath;  Surgeon: Dolores Patty, MD;  Location[facility] INVASIVE CV LAB;  Service: Cardiovascular;  Laterality: N/A;  . CARDIAC SURGERY  July 01, 1977   "my heart was backwards" (05/03/2013)  . LAPAROSCOPIC APPENDECTOMY N/A 05/03/2013   Procedure: APPENDECTOMY LAPAROSCOPIC;  Surgeon: Cherylynn Ridges, MD;  Location: [facility] OR;  Service: General;  Laterality: N/A;  . LUNG SURGERY  06-28-1977  . RIGHT HEART CATHETERIZATION N/A 03/07/2012   Procedure: RIGHT HEART CATH;  Surgeon: Kathleene Hazel, MD;  Location: [facility] CATH LAB;  Service: Cardiovascular;  Laterality: N/A;    Allergies  Allergen Reactions  . Bee Venom Swelling    Pt states she  swells up 3x normal size. If stung "2-3 times, my throat could close up"    Immunization History  Administered Date(s) Administered  . Influenza Split 01/06/2011, 10/20/2012, 09/06/2013  . Influenza, High Dose Seasonal PF 07/29/2016  . Influenza,inj,Quad PF,6+ Mos 09/27/2014, 09/16/2015, 09/16/2017, 08/30/2018  .  Pneumococcal Conjugate-13 09/16/2015  . Pneumococcal Polysaccharide-23 01/06/2011, 02/24/2018  . Tdap 09/06/2013    Family History  Problem Relation Age of Onset  . Hypertension Father   . Heart disease Father        CHF; died age 66   . Alcohol abuse Father   . Other Brother        died age 37 y.o overdose     Current Outpatient Medications:  .  albuterol (PROVENTIL HFA;VENTOLIN HFA) 108 (90 Base) MCG/ACT inhaler, USE 2 PUFFS EVERY 6 HOURS AS NEEDED FOR WHEEZING, Disp: 18 Inhaler, Rfl: 3 .  atorvastatin (LIPITOR) 40 MG tablet, Take 1 tablet (40 mg total) by mouth daily., Disp: 90 tablet, Rfl: 3 .  budesonide-formoterol (SYMBICORT) 160-4.5 MCG/ACT inhaler, Inhale 2 puffs into the lungs daily., Disp: 1 Inhaler, Rfl: 09 .  chlorhexidine (PERIDEX) 0.12 % solution, Use as directed 15 mLs in the mouth or throat 4 (four) times daily. (Patient not taking: Reported on 08/30/2018), Disp: 120 mL, Rfl: 0 .  gabapentin (NEURONTIN) 300 MG capsule, TAKE 2 CAPSULES (600 MG TOTAL) BY MOUTH 3 (THREE) TIMES DAILY., Disp: 540 capsule, Rfl: 1 .  glucose blood (ONE TOUCH ULTRA TEST) test strip, Use as instructed four times per day E11.9, Disp: 400 each, Rfl: 12 .  HUMALOG KWIKPEN 100 UNIT/ML KiwkPen, Inject 15 mg into the skin 3 (three) times daily with meals., Disp: , Rfl: 11 .  Insulin Glargine (BASAGLAR KWIKPEN) 100 UNIT/ML SOPN, Inject 0.25 mLs (25 Units total) into the skin daily at 10 pm. E11.9, Disp: 5 pen, Rfl: 11 .  Insulin Pen Needle (BD PEN NEEDLE NANO U/F) 32G X 4 MM MISC, Use as directed four times per day daily E11.9, Disp: 400 each, Rfl: 3 .  lidocaine (XYLOCAINE) 2 % solution, Use as directed 15 mLs in the mouth or throat every 6 (six) hours as needed for mouth pain. (Patient not taking: Reported on 08/30/2018), Disp: 100 mL, Rfl: 0 .  pantoprazole (PROTONIX) 40 MG tablet, Take 1 tablet (40 mg total) by mouth 2 (two) times daily before a meal., Disp: 60 tablet, Rfl: 0 .  pregabalin (LYRICA) 50  MG capsule, Take 1 capsule (50 mg total) by mouth 2 (two) times daily., Disp: 60 capsule, Rfl: 3 .  SPIRIVA RESPIMAT 1.25 MCG/ACT AERS, INHALE 2 PUFFS INTO THE LUNGS DAILY., Disp: 1 Inhaler, Rfl: 5 .  torsemide (DEMADEX) 20 MG tablet, TAKE 1 TABLET BY MOUTH EVERY DAY, Disp: 90 tablet, Rfl: 0 .  traMADol (ULTRAM) 50 MG tablet, Take 1 tablet (50 mg total) by mouth every 6 (six) hours as needed., Disp: 120 tablet, Rfl: 2      Objective:   There were no vitals filed for this visit.  Estimated body mass index is 36.03 kg/m as calculated from the following:   Height as of 08/30/18: 5\' 2"  (1.575 m).   Weight as of 08/30/18: 197 lb (89.4 kg).  @WEIGHTCHANGE @  There were no vitals filed for this visit.   Physical Exam  General Appearance:    Alert, cooperative, no distress, appears stated age - *** , Deconditioned looking - *** , OBESE  - ***, Sitting on Wheelchair -  ***  Head:    Normocephalic, without obvious abnormality, atraumatic  Eyes:    PERRL, conjunctiva/corneas clear,  Ears:    Normal TM's and external ear canals, both ears  Nose:   Nares normal, septum midline, mucosa normal, no drainage    or sinus tenderness. OXYGEN ON  - *** . Patient is @ ***   Throat:   Lips, mucosa, and tongue normal; teeth and gums normal. Cyanosis on lips - ***  Neck:   Supple, symmetrical, trachea midline, no adenopathy;    thyroid:  no enlargement/tenderness/nodules; no carotid   bruit or JVD  Back:     Symmetric, no curvature, ROM normal, no CVA tenderness  Lungs:     Distress - *** , Wheeze ***, Barrell Chest - ***, Purse lip breathing - ***, Crackles - ***   Chest Wall:    No tenderness or deformity.    Heart:    Regular rate and rhythm, S1 and S2 normal, no rub   or gallop, Murmur - ***  Breast Exam:    NOT DONE  Abdomen:     Soft, non-tender, bowel sounds active all four quadrants,    no masses, no organomegaly. Visceral obesity - ***  Genitalia:   NOT DONE  Rectal:   NOT DONE  Extremities:    Extremities - normal, Has Cane - ***, Clubbing - ***, Edema - ***  Pulses:   2+ and symmetric all extremities  Skin:   Stigmata of Connective Tissue Disease - ***  Lymph nodes:   Cervical, supraclavicular, and axillary nodes normal  Psychiatric:  Neurologic:   Pleasant - ***, Anxious - ***, Flat affect - ***  CAm-ICU - neg, Alert and Oriented x 3 - yes, Moves all 4s - yes, Speech - normal, Cognition - intact           Assessment:     No diagnosis found.     Plan:     There are no Patient Instructions on file for this visit.    SIGNATURE    Dr. Kalman Shan, M.D., F.C.C.P,  Pulmonary and Critical Care Medicine Staff Physician, [facility] Center Director - Interstitial Lung Disease  Program  Pulmonary Fibrosis [facility] at [facility] [place], [place], [address]  Pager: [phone], If no answer or between  15:00h - 7:00h: call [phone] Telephone: [phone]  12:22 AM 09/04/2018

## 2018-09-08 DIAGNOSIS — G4733 Obstructive sleep apnea (adult) (pediatric): Secondary | ICD-10-CM | POA: Diagnosis not present

## 2018-09-08 DIAGNOSIS — I270 Primary pulmonary hypertension: Secondary | ICD-10-CM | POA: Diagnosis not present

## 2018-09-08 DIAGNOSIS — J449 Chronic obstructive pulmonary disease, unspecified: Secondary | ICD-10-CM | POA: Diagnosis not present

## 2018-09-10 DIAGNOSIS — I270 Primary pulmonary hypertension: Secondary | ICD-10-CM | POA: Diagnosis not present

## 2018-09-10 DIAGNOSIS — J449 Chronic obstructive pulmonary disease, unspecified: Secondary | ICD-10-CM | POA: Diagnosis not present

## 2018-09-10 DIAGNOSIS — G4733 Obstructive sleep apnea (adult) (pediatric): Secondary | ICD-10-CM | POA: Diagnosis not present

## 2018-09-12 ENCOUNTER — Inpatient Hospital Stay (HOSPITAL_COMMUNITY)
Admission: EM | Admit: 2018-09-12 | Discharge: 2018-09-15 | DRG: 190 | Disposition: A | Discharge status: Home Health | Payer: 59 | Attending: Internal Medicine | Admitting: Internal Medicine

## 2018-09-12 ENCOUNTER — Emergency Department (HOSPITAL_COMMUNITY): Payer: 59

## 2018-09-12 ENCOUNTER — Other Ambulatory Visit: Payer: Self-pay

## 2018-09-12 ENCOUNTER — Encounter (HOSPITAL_COMMUNITY): Payer: Self-pay | Admitting: Emergency Medicine

## 2018-09-12 DIAGNOSIS — J9622 Acute and chronic respiratory failure with hypercapnia: Secondary | ICD-10-CM

## 2018-09-12 DIAGNOSIS — J441 Chronic obstructive pulmonary disease with (acute) exacerbation: Principal | ICD-10-CM | POA: Diagnosis present

## 2018-09-12 DIAGNOSIS — E1165 Type 2 diabetes mellitus with hyperglycemia: Secondary | ICD-10-CM | POA: Diagnosis present

## 2018-09-12 DIAGNOSIS — Z9049 Acquired absence of other specified parts of digestive tract: Secondary | ICD-10-CM

## 2018-09-12 DIAGNOSIS — D696 Thrombocytopenia, unspecified: Secondary | ICD-10-CM | POA: Diagnosis not present

## 2018-09-12 DIAGNOSIS — E1169 Type 2 diabetes mellitus with other specified complication: Secondary | ICD-10-CM | POA: Diagnosis not present

## 2018-09-12 DIAGNOSIS — F17210 Nicotine dependence, cigarettes, uncomplicated: Secondary | ICD-10-CM | POA: Diagnosis present

## 2018-09-12 DIAGNOSIS — Z8249 Family history of ischemic heart disease and other diseases of the circulatory system: Secondary | ICD-10-CM

## 2018-09-12 DIAGNOSIS — I5032 Chronic diastolic (congestive) heart failure: Secondary | ICD-10-CM | POA: Diagnosis present

## 2018-09-12 DIAGNOSIS — Z8709 Personal history of other diseases of the respiratory system: Secondary | ICD-10-CM

## 2018-09-12 DIAGNOSIS — G4733 Obstructive sleep apnea (adult) (pediatric): Secondary | ICD-10-CM | POA: Diagnosis present

## 2018-09-12 DIAGNOSIS — E669 Obesity, unspecified: Secondary | ICD-10-CM | POA: Diagnosis present

## 2018-09-12 DIAGNOSIS — Z86718 Personal history of other venous thrombosis and embolism: Secondary | ICD-10-CM

## 2018-09-12 DIAGNOSIS — E785 Hyperlipidemia, unspecified: Secondary | ICD-10-CM | POA: Diagnosis present

## 2018-09-12 DIAGNOSIS — R0902 Hypoxemia: Secondary | ICD-10-CM | POA: Diagnosis not present

## 2018-09-12 DIAGNOSIS — J9621 Acute and chronic respiratory failure with hypoxia: Secondary | ICD-10-CM | POA: Diagnosis not present

## 2018-09-12 DIAGNOSIS — Z811 Family history of alcohol abuse and dependence: Secondary | ICD-10-CM

## 2018-09-12 DIAGNOSIS — Z9981 Dependence on supplemental oxygen: Secondary | ICD-10-CM

## 2018-09-12 DIAGNOSIS — Z794 Long term (current) use of insulin: Secondary | ICD-10-CM

## 2018-09-12 DIAGNOSIS — Z79891 Long term (current) use of opiate analgesic: Secondary | ICD-10-CM

## 2018-09-12 DIAGNOSIS — E1149 Type 2 diabetes mellitus with other diabetic neurological complication: Secondary | ICD-10-CM

## 2018-09-12 DIAGNOSIS — Z79899 Other long term (current) drug therapy: Secondary | ICD-10-CM

## 2018-09-12 DIAGNOSIS — Z86711 Personal history of pulmonary embolism: Secondary | ICD-10-CM

## 2018-09-12 DIAGNOSIS — R0602 Shortness of breath: Secondary | ICD-10-CM | POA: Diagnosis not present

## 2018-09-12 DIAGNOSIS — Z8701 Personal history of pneumonia (recurrent): Secondary | ICD-10-CM

## 2018-09-12 DIAGNOSIS — I2729 Other secondary pulmonary hypertension: Secondary | ICD-10-CM | POA: Diagnosis present

## 2018-09-12 DIAGNOSIS — E871 Hypo-osmolality and hyponatremia: Secondary | ICD-10-CM | POA: Diagnosis present

## 2018-09-12 DIAGNOSIS — E1140 Type 2 diabetes mellitus with diabetic neuropathy, unspecified: Secondary | ICD-10-CM | POA: Diagnosis present

## 2018-09-12 DIAGNOSIS — Z7951 Long term (current) use of inhaled steroids: Secondary | ICD-10-CM

## 2018-09-12 DIAGNOSIS — K7460 Unspecified cirrhosis of liver: Secondary | ICD-10-CM | POA: Diagnosis present

## 2018-09-12 DIAGNOSIS — F1021 Alcohol dependence, in remission: Secondary | ICD-10-CM | POA: Diagnosis present

## 2018-09-12 DIAGNOSIS — Z91030 Bee allergy status: Secondary | ICD-10-CM

## 2018-09-12 DIAGNOSIS — T380X5A Adverse effect of glucocorticoids and synthetic analogues, initial encounter: Secondary | ICD-10-CM | POA: Diagnosis present

## 2018-09-12 LAB — CBC
HEMATOCRIT: 42.7 % (ref 36.0–46.0)
HEMOGLOBIN: 13.3 g/dL (ref 12.0–15.0)
MCH: 32.4 pg (ref 26.0–34.0)
MCHC: 31.1 g/dL (ref 30.0–36.0)
MCV: 104.1 fL — AB (ref 80.0–100.0)
Platelets: 87 10*3/uL — ABNORMAL LOW (ref 150–400)
RBC: 4.1 MIL/uL (ref 3.87–5.11)
RDW: 14.9 % (ref 11.5–15.5)
WBC: 7.3 10*3/uL (ref 4.0–10.5)

## 2018-09-12 LAB — BASIC METABOLIC PANEL
ANION GAP: 9 (ref 5–15)
BUN: 5 mg/dL — ABNORMAL LOW (ref 6–20)
CHLORIDE: 91 mmol/L — AB (ref 98–111)
CO2: 36 mmol/L — AB (ref 22–32)
Calcium: 8.8 mg/dL — ABNORMAL LOW (ref 8.9–10.3)
Creatinine, Ser: 0.43 mg/dL — ABNORMAL LOW (ref 0.44–1.00)
GFR calc Af Amer: >60 mL/min (ref >60)
GFR calc non Af Amer: >60 mL/min (ref >60)
Glucose, Bld: 185 mg/dL — ABNORMAL HIGH (ref 70–99)
Potassium: 3.6 mmol/L (ref 3.5–5.1)
Sodium: 136 mmol/L (ref 135–145)

## 2018-09-12 LAB — GLUCOSE, CAPILLARY
GLUCOSE-CAPILLARY: 421 mg/dL — AB (ref 70–99)
GLUCOSE-CAPILLARY: 429 mg/dL — AB (ref 70–99)

## 2018-09-12 LAB — BRAIN NATRIURETIC PEPTIDE: B Natriuretic Peptide: 44 pg/mL (ref 0.0–100.0)

## 2018-09-12 MED ORDER — PANTOPRAZOLE SODIUM 40 MG PO TBEC
40.0000 mg | DELAYED_RELEASE_TABLET | Freq: Two times a day (BID) | ORAL | Status: DC
  Administered 2018-09-12 – 2018-09-15 (×7): 40 mg via ORAL
  Filled 2018-09-12 (×7): qty 1

## 2018-09-12 MED ORDER — DM-GUAIFENESIN ER 30-600 MG PO TB12
1.0000 | ORAL_TABLET | Freq: Two times a day (BID) | ORAL | Status: DC
  Administered 2018-09-12 – 2018-09-15 (×7): 1 via ORAL
  Filled 2018-09-12 (×7): qty 1

## 2018-09-12 MED ORDER — METHYLPREDNISOLONE SODIUM SUCC 125 MG IJ SOLR
60.0000 mg | Freq: Four times a day (QID) | INTRAMUSCULAR | Status: DC
  Administered 2018-09-12 – 2018-09-13 (×3): 60 mg via INTRAVENOUS
  Filled 2018-09-12 (×4): qty 2

## 2018-09-12 MED ORDER — IPRATROPIUM-ALBUTEROL 0.5-2.5 (3) MG/3ML IN SOLN
3.0000 mL | Freq: Once | RESPIRATORY_TRACT | Status: AC
  Administered 2018-09-12: 3 mL via RESPIRATORY_TRACT
  Filled 2018-09-12: qty 3

## 2018-09-12 MED ORDER — ONDANSETRON HCL 4 MG/2ML IJ SOLN: 4.0000 mg | Freq: Four times a day (QID) | INTRAMUSCULAR | Status: DC | PRN

## 2018-09-12 MED ORDER — INSULIN GLARGINE 100 UNIT/ML ~~LOC~~ SOLN
25.0000 [IU] | Freq: Every day | SUBCUTANEOUS | Status: DC
  Administered 2018-09-12 – 2018-09-13 (×2): 25 [IU] via SUBCUTANEOUS
  Filled 2018-09-12 (×3): qty 0.25

## 2018-09-12 MED ORDER — IPRATROPIUM-ALBUTEROL 0.5-2.5 (3) MG/3ML IN SOLN
3.0000 mL | Freq: Four times a day (QID) | RESPIRATORY_TRACT | Status: DC
  Administered 2018-09-12 – 2018-09-15 (×13): 3 mL via RESPIRATORY_TRACT
  Filled 2018-09-12 (×13): qty 3

## 2018-09-12 MED ORDER — ONDANSETRON HCL 4 MG PO TABS: 4.0000 mg | ORAL_TABLET | Freq: Four times a day (QID) | ORAL | Status: DC | PRN

## 2018-09-12 MED ORDER — MAGNESIUM SULFATE 2 GM/50ML IV SOLN
2.0000 g | Freq: Once | INTRAVENOUS | Status: AC
  Administered 2018-09-12: 2 g via INTRAVENOUS
  Filled 2018-09-12: qty 50

## 2018-09-12 MED ORDER — ACETAMINOPHEN 325 MG PO TABS: 650.0000 mg | ORAL_TABLET | Freq: Four times a day (QID) | ORAL | Status: DC | PRN

## 2018-09-12 MED ORDER — PREGABALIN 50 MG PO CAPS
50.0000 mg | ORAL_CAPSULE | Freq: Two times a day (BID) | ORAL | Status: DC
  Administered 2018-09-12: 50 mg via ORAL
  Filled 2018-09-12: qty 1

## 2018-09-12 MED ORDER — TORSEMIDE 20 MG PO TABS
20.0000 mg | ORAL_TABLET | Freq: Every day | ORAL | Status: DC
  Administered 2018-09-12 – 2018-09-14 (×3): 20 mg via ORAL
  Filled 2018-09-12 (×4): qty 1

## 2018-09-12 MED ORDER — INSULIN ASPART 100 UNIT/ML ~~LOC~~ SOLN
12.0000 [IU] | Freq: Once | SUBCUTANEOUS | Status: AC
  Administered 2018-09-13: 12 [IU] via SUBCUTANEOUS

## 2018-09-12 MED ORDER — ATORVASTATIN CALCIUM 40 MG PO TABS
40.0000 mg | ORAL_TABLET | Freq: Every day | ORAL | Status: DC
  Administered 2018-09-12 – 2018-09-15 (×4): 40 mg via ORAL
  Filled 2018-09-12 (×4): qty 1

## 2018-09-12 MED ORDER — SODIUM CHLORIDE 0.9 % IV SOLN: 250.0000 mL | INTRAVENOUS | Status: DC | PRN

## 2018-09-12 MED ORDER — PREDNISONE 20 MG PO TABS: 40.0000 mg | ORAL_TABLET | Freq: Every day | ORAL | Status: DC

## 2018-09-12 MED ORDER — SODIUM CHLORIDE 0.9% FLUSH
3.0000 mL | INTRAVENOUS | Status: DC | PRN
  Administered 2018-09-13: 3 mL via INTRAVENOUS
  Filled 2018-09-12: qty 3

## 2018-09-12 MED ORDER — GABAPENTIN 300 MG PO CAPS
600.0000 mg | ORAL_CAPSULE | Freq: Three times a day (TID) | ORAL | Status: DC
  Administered 2018-09-12 – 2018-09-15 (×10): 600 mg via ORAL
  Filled 2018-09-12 (×10): qty 2

## 2018-09-12 MED ORDER — SODIUM CHLORIDE 0.9% FLUSH
3.0000 mL | Freq: Two times a day (BID) | INTRAVENOUS | Status: DC
  Administered 2018-09-12 – 2018-09-13 (×3): 3 mL via INTRAVENOUS

## 2018-09-12 MED ORDER — MOMETASONE FURO-FORMOTEROL FUM 200-5 MCG/ACT IN AERO
2.0000 | INHALATION_SPRAY | Freq: Two times a day (BID) | RESPIRATORY_TRACT | Status: DC
  Administered 2018-09-12 – 2018-09-13 (×2): 2 via RESPIRATORY_TRACT
  Filled 2018-09-12: qty 8.8

## 2018-09-12 MED ORDER — PREGABALIN 50 MG PO CAPS
50.0000 mg | ORAL_CAPSULE | Freq: Two times a day (BID) | ORAL | Status: DC
  Filled 2018-09-12: qty 1

## 2018-09-12 MED ORDER — TRAMADOL HCL 50 MG PO TABS
50.0000 mg | ORAL_TABLET | Freq: Four times a day (QID) | ORAL | Status: DC | PRN
  Administered 2018-09-12 – 2018-09-15 (×10): 50 mg via ORAL
  Filled 2018-09-12 (×10): qty 1

## 2018-09-12 MED ORDER — METHYLPREDNISOLONE SODIUM SUCC 125 MG IJ SOLR
125.0000 mg | Freq: Once | INTRAMUSCULAR | Status: AC
  Administered 2018-09-12: 125 mg via INTRAVENOUS
  Filled 2018-09-12: qty 2

## 2018-09-12 MED ORDER — ALBUTEROL SULFATE (2.5 MG/3ML) 0.083% IN NEBU
5.0000 mg | INHALATION_SOLUTION | Freq: Once | RESPIRATORY_TRACT | Status: AC
  Administered 2018-09-12: 5 mg via RESPIRATORY_TRACT
  Filled 2018-09-12: qty 6

## 2018-09-12 MED ORDER — PREGABALIN 50 MG PO CAPS
50.0000 mg | ORAL_CAPSULE | Freq: Two times a day (BID) | ORAL | Status: DC
  Administered 2018-09-12 – 2018-09-15 (×6): 50 mg via ORAL
  Filled 2018-09-12 (×5): qty 1

## 2018-09-12 MED ORDER — ALBUTEROL (5 MG/ML) CONTINUOUS INHALATION SOLN
10.0000 mg/h | INHALATION_SOLUTION | Freq: Once | RESPIRATORY_TRACT | Status: AC
  Administered 2018-09-12: 10 mg/h via RESPIRATORY_TRACT
  Filled 2018-09-12: qty 20

## 2018-09-12 MED ORDER — ACETAMINOPHEN 650 MG RE SUPP: 650.0000 mg | Freq: Four times a day (QID) | RECTAL | Status: DC | PRN

## 2018-09-12 NOTE — ED Provider Notes (Signed)
MSE was initiated and I personally evaluated the patient and placed orders (if any) at  6:33 AM on September 12, 2018.  The patient appears stable so that the remainder of the MSE may be completed by another provider.  Patient has a history of COPD, she has quit smoking.  She is on 3 L/min nasal cannula however when she starts getting short of breath she does increase it to 4 per her doctor's orders.  She states she has had shortness of breath for a few days that got worse about 4 AM.  She states her BiPAP did not help her feel better.  She has some cough and has rare mucus production.  She is not sure if she is wheezing she states she just feels very tight.  She denies any fever.  Patient states she is very sensitive to Solu-Medrol and it will shoot her blood sugar up very quickly into the 400s  On exam patient's left eye deviates laterally.  She appears to have some respiratory distress with some mild retractions.  She has very diminished breath sounds diffusely.   EKG Interpretation  Date/Time:  Tuesday September 12 2018 06:19:37 EDT Ventricular Rate:  102 PR Interval:    QRS Duration: 91 QT Interval:  367 QTC Calculation: 479 R Axis:   102 Text Interpretation:  Sinus tachycardia Right atrial enlargement Probable lateral infarct, old Rightward axis Baseline wander No significant change since last tracing 03 Jul 2018 Confirmed by Devoria Albe ([id number]) on 09/12/2018 6:32:29 AM      Medications  methylPREDNISolone sodium succinate (SOLU-MEDROL) 125 mg/2 mL injection 125 mg (has no administration in time range)  magnesium sulfate IVPB 2 g 50 mL (has no administration in time range)  albuterol (PROVENTIL,VENTOLIN) solution continuous neb (has no administration in time range)  albuterol (PROVENTIL) (2.5 MG/3ML) 0.083% nebulizer solution 5 mg (5 mg Nebulization Given 09/12/18 0630)      Patient's BUN and creatinine were normal on September 25.  She was given IV Solu-Medrol, 2 g magnesium IV, and in  addition to the regular nebulizer started by nursing staff placed on a continuous nebulizer.  Chest x-ray was ordered.  Patient was turned over to day shift at change of shift at  7 AM.   Devoria Albe, MD, Iline Oven, Jodelle Gross, MD 09/12/18 [phone]

## 2018-09-12 NOTE — H&P (Signed)
[facility] History and Physical  Mary [name] JXB:[id number] DOB: 1975-12-22 DOA: 09/12/2018  PCP: Corwin Levins, MD  Patient coming from: Home  Chief Complaint: Shortness of breath  HPI: Mary [name] is a 42 y.o. female with a medical history of COPD on 3 L of home oxygen, CHF, diabetes, who presented to the emergency department with complaints of shortness of breath.  She states she has been having worsening dyspnea for the last few days however it was worse this morning at approximately 4 AM.  She tried her BiPAP at home which did not help she is also tried taking numerous nebulizer and inhaler treatments.  She has no other associated symptoms.  Denies current chest pain, abdominal pain, nausea or vomiting, diarrhea or constipation, dizziness or headache, problems with urination, recent travel or ill contacts.  She does state that she recently had to move into a hotel she is in between homes and is noticed that her breathing worsened while being at the hotel. Of note, patient follows up with pulmonology, Dr. Marchelle Gearing however missed her recent appointment.  ED Course: Found to have COPD Exacerbation. Given hour long neb, steroids, magnesium. TRH called for admission.   Review of Systems:  All other systems reviewed and are negative.   Past Medical History:  Diagnosis Date  . Asthma   . Axillary adenopathy    right axillary adenopathy noted on CT chest (03/06/2012)  . Chronic right-sided CHF (congestive heart failure) (HCC) 03/23/2012   with cor pulmonale. Last RHC 03/2012  . COPD (chronic obstructive pulmonary disease) (HCC)    PFTS 03/08/12: fev1 0.58L/1%, FVC 1.18/33%, Ratop 49 and c/w ssevere obstruction. 21% BD response on FVC, RV 219%, DLCO 11/54%  . Diabetes mellitus without complication (HCC)   . DVT of upper extremity (deep vein thrombosis) [facility]) April 2013   right subclavian // Unclear precipitating cause - possibly significant right heart failure, with vascular  stasis potentially predisposing to clotting.    . Exertional shortness of breath   . Hepatic cirrhosis (HCC)    Questionable history of - Noted on CT abdomen (03/2012) - thought to be due to vascular congestion from right heart failure +/- patient's history of alcohol abuse  . History of alcoholism (HCC)   . History of chronic bronchitis   . Irregular menses   . Migraine    "~ 1/yr" (05/03/2013)  . Moderate to severe pulmonary hypertension [facility]) April 2013   Cardiac cath on 03/06/12 - 1. Elevated pulmonary artery pressures, right sided filling pressures.,  2. PA: 64/45 (mean 53)    . On home oxygen therapy    "2-3 L 24/7" (05/03/2013)  . OSA (obstructive sleep apnea)    wears noctural BiPAP (05/03/2013)  . Periodontal disease   . Pneumonia ~ 1985; 01/2011  . Pulmonary embolus [facility]) April 2013   Precipitating cause unclear. Was treated with coumadin from April-June 2013.  . Pulmonary hypertension (HCC)     Past Surgical History:  Procedure Laterality Date  . APPENDECTOMY  05/03/2013  . CARDIAC CATHETERIZATION  03/2012  . CARDIAC CATHETERIZATION N/A 01/08/2016   Procedure: Right Heart Cath;  Surgeon: Dolores Patty, MD;  Location: [facility] INVASIVE CV LAB;  Service: Cardiovascular;  Laterality: N/A;  . CARDIAC SURGERY  05/25/77   "my heart was backwards" (05/03/2013)  . LAPAROSCOPIC APPENDECTOMY N/A 05/03/2013   Procedure: APPENDECTOMY LAPAROSCOPIC;  Surgeon: Cherylynn Ridges, MD;  Location: [facility] OR;  Service: General;  Laterality: N/A;  . LUNG SURGERY  09/25/1977  . RIGHT HEART CATHETERIZATION N/A 03/07/2012   Procedure: RIGHT HEART CATH;  Surgeon: Kathleene Hazel, MD;  Location: [facility] CATH LAB;  Service: Cardiovascular;  Laterality: N/A;    Social History:  reports that she has been smoking cigarettes. She has a 6.25 pack-year smoking history. She has never used smokeless tobacco. She reports that she drank alcohol. She reports that she does not use drugs.  Allergies  Allergen Reactions  .  Bee Venom Swelling    Pt states she swells up 3x normal size. If stung "2-3 times, my throat could close up"    Family History  Problem Relation Age of Onset  . Hypertension Father   . Heart disease Father        CHF; died age 28   . Alcohol abuse Father   . Other Brother        died age 23 y.o overdose    Prior to Admission medications   Medication Sig Start Date End Date Taking? Authorizing Provider  albuterol (PROVENTIL HFA;VENTOLIN HFA) 108 (90 Base) MCG/ACT inhaler USE 2 PUFFS EVERY 6 HOURS AS NEEDED FOR WHEEZING 06/09/18  Yes Nyoka Cowden, MD  budesonide-formoterol (SYMBICORT) 160-4.5 MCG/ACT inhaler Inhale 2 puffs into the lungs daily. 04/19/18  Yes Kalman Shan, MD  gabapentin (NEURONTIN) 300 MG capsule TAKE 2 CAPSULES (600 MG TOTAL) BY MOUTH 3 (THREE) TIMES DAILY. 04/17/18  Yes Corwin Levins, MD  glucose blood (ONE TOUCH ULTRA TEST) test strip Use as instructed four times per day E11.9 05/24/18  Yes Corwin Levins, MD  HUMALOG KWIKPEN 100 UNIT/ML KiwkPen Inject 15 mg into the skin 3 (three) times daily with meals. 05/24/18  Yes [provider]  Insulin Glargine (BASAGLAR KWIKPEN) 100 UNIT/ML SOPN Inject 0.25 mLs (25 Units total) into the skin daily at 10 pm. E11.9 05/24/18  Yes Corwin Levins, MD  Insulin Pen Needle (BD PEN NEEDLE NANO U/F) 32G X 4 MM MISC Use as directed four times per day daily E11.9 05/24/18  Yes Corwin Levins, MD  pantoprazole (PROTONIX) 40 MG tablet Take 1 tablet (40 mg total) by mouth 2 (two) times daily before a meal. 07/22/16  Yes Jeralyn Bennett, MD  pregabalin (LYRICA) 50 MG capsule Take 1 capsule (50 mg total) by mouth 2 (two) times daily. 10/31/17  Yes Plotnikov, Georgina Quint, MD  SPIRIVA RESPIMAT 1.25 MCG/ACT AERS INHALE 2 PUFFS INTO THE LUNGS DAILY. 06/14/18  Yes Kalman Shan, MD  torsemide (DEMADEX) 20 MG tablet TAKE 1 TABLET BY MOUTH EVERY DAY 08/24/18  Yes Corwin Levins, MD  traMADol (ULTRAM) 50 MG tablet Take 1 tablet (50 mg total) by mouth  every 6 (six) hours as needed. 08/30/18  Yes Corwin Levins, MD  atorvastatin (LIPITOR) 40 MG tablet Take 1 tablet (40 mg total) by mouth daily. Patient not taking: Reported on 09/12/2018 08/30/18   Corwin Levins, MD    Physical Exam: Vitals:   09/12/18 1030 09/12/18 1152  BP: 116/74   Pulse: 99   Resp: (!) 22   Temp:    SpO2: (!) 89% 91%     General: Well developed, well nourished, NAD, appears stated age  HEENT: NCAT, PERRLA, EOMI, Anicteic Sclera, mucous membranes moist.   Neck: Supple, no JVD, no masses  Cardiovascular: S1 S2 auscultated, no rubs, murmurs or gallops. Regular rate and rhythm.  Respiratory: Diminished breath sounds, no wheezing noted, mildly tachypneic   Abdomen: Soft, nontender, nondistended, + bowel sounds  Extremities:  warm dry without cyanosis clubbing or edema  Neuro: AAOx3, cranial nerves grossly intact. Strength 5/5 in patient's upper and lower extremities bilaterally  Skin: Without rashes exudates or nodules  Psych: Normal affect and demeanor with intact judgement and insight  Labs on Admission: I have personally reviewed following labs and imaging studies CBC: Recent Labs  Lab 09/12/18 0720  WBC 7.3  HGB 13.3  HCT 42.7  MCV 104.1*  PLT 87*   Basic Metabolic Panel: Recent Labs  Lab 09/12/18 0720  NA 136  K 3.6  CL 91*  CO2 36*  GLUCOSE 185*  BUN 5*  CREATININE 0.43*  CALCIUM 8.8*   GFR: Estimated Creatinine Clearance: 97.1 mL/min (A) (by C-G formula based on SCr of 0.43 mg/dL (L)). Liver Function Tests: No results for input(s): AST, ALT, ALKPHOS, BILITOT, PROT, ALBUMIN in the last 168 hours. No results for input(s): LIPASE, AMYLASE in the last 168 hours. No results for input(s): AMMONIA in the last 168 hours. Coagulation Profile: No results for input(s): INR, PROTIME in the last 168 hours. Cardiac Enzymes: No results for input(s): CKTOTAL, CKMB, CKMBINDEX, TROPONINI in the last 168 hours. BNP (last 3 results) No results for  input(s): PROBNP in the last 8760 hours. HbA1C: No results for input(s): HGBA1C in the last 72 hours. CBG: No results for input(s): GLUCAP in the last 168 hours. Lipid Profile: No results for input(s): CHOL, HDL, LDLCALC, TRIG, CHOLHDL, LDLDIRECT in the last 72 hours. Thyroid Function Tests: No results for input(s): TSH, T4TOTAL, FREET4, T3FREE, THYROIDAB in the last 72 hours. Anemia Panel: No results for input(s): VITAMINB12, FOLATE, FERRITIN, TIBC, IRON, RETICCTPCT in the last 72 hours. Urine analysis:    Component Value Date/Time   COLORURINE YELLOW 05/12/2018 1112   APPEARANCEUR HAZY (A) 05/12/2018 1112   LABSPEC 1.030 05/12/2018 1112   PHURINE 6.0 05/12/2018 1112   GLUCOSEU >=500 (A) 05/12/2018 1112   GLUCOSEU 100 (A) 02/24/2018 1644   HGBUR NEGATIVE 05/12/2018 1112   BILIRUBINUR NEGATIVE 05/12/2018 1112   KETONESUR 5 (A) 05/12/2018 1112   PROTEINUR NEGATIVE 05/12/2018 1112   UROBILINOGEN 2.0 (A) 02/24/2018 1644   NITRITE NEGATIVE 05/12/2018 1112   LEUKOCYTESUR MODERATE (A) 05/12/2018 1112   Sepsis Labs: @LABRCNTIP (procalcitonin:4,lacticidven:4) )No results found for this or any previous visit (from the past 240 hour(s)).   Radiological Exams on Admission: Dg Chest Port 1 View  Result Date: 09/12/2018 CLINICAL DATA:  History of COPD. Became short of breath. Shortness of breath for a few days worsening this morning. EXAM: PORTABLE CHEST 1 VIEW COMPARISON:  07/03/2018 FINDINGS: Cardiac enlargement. Probable emphysematous changes in the lungs. No airspace disease or consolidation. No blunting of costophrenic angles. No pneumothorax. Mediastinal contours appear intact. IMPRESSION: Cardiac enlargement with probable emphysematous changes in the lungs. No evidence of active pulmonary disease. Electronically Signed   By: Burman Nieves M.D.   On: 09/12/2018 06:58    EKG: Independently reviewed.  Sinus tachycardia, rate 102  Assessment/Plan  COPD exacerbation/acute on chronic  hypoxic respiratory failure -appears to be mild (patient able to carry on full conversation without stopping) -Patient noted to have oxygen saturations -CXR: unremarkable for infection  -will place on solumedrol, nebs, mucinex, fluttler valve, incentive spirometry -continue Dulera (Symbicort substitution) -will not start antibiotics at this time -Hold Spiriva  Diabetes mellitus, type II -Continue glargine -Will place on insulin sliding scale CBG monitoring -Patient states she does not use Humalog 15 units 3 times daily all the time, only as needed.  She also states  she has a sliding scale that she follows at home.  Chronic diastolic failure -Echocardiogram 02/01/2017: EF 65 to 70%, grade 1 diastolic dysfunction -Currently appears to be euvolemic and compensated -Continue torsemide -Monitor intake and output, daily weights  Diabetic neuropathy -Continue gabapentin, Lyrica  Hyperlipidemia -Continue statin -She states her her doctor sent this medication and for her but did not discuss her lipid results.  Have gone over her lipid profile.  Chronic thrombocytopenia -continue to monitor CBC  DVT prophylaxis: SCDs  Code Status: Full  Family Communication: None at bedside. Admission, patients condition and plan of care including tests being ordered have been discussed with the patient, who indicates understanding and agrees with the plan and Code Status.  Disposition Plan: Home   Consults called: None   Admission status: observation.    Time spent: 70 minutes  Jebidiah Baggerly D.O. [facility]  Between 7am to 7pm - Please see pager noted on [email or web address]  After 7pm go to [email or web address] 09/12/2018, 12:32 PM

## 2018-09-12 NOTE — ED Provider Notes (Signed)
[facility] EMERGENCY DEPARTMENT Provider Note   CSN: [id number] Arrival date & time: 09/12/18  4098     History   Chief Complaint Chief Complaint  Patient presents with  . Shortness of Breath    HPI Mary [name] is a 42 y.o. female.  Level 5 caveat for acuity of condition.  Patient has a known history of COPD and is on 3 L of nasal oxygen at home.  She takes multiple respiratory medications.  Chief complaint is worsening dyspnea over the past few days, worse since 4 AM today.  She tried BiPAP at home but this did not help.  She has multiple health problems including COPD, CHF, diabetes, cirrhosis, pulmonary hypertension, many others.  Severity of symptoms is severe.     Past Medical History:  Diagnosis Date  . Asthma   . Axillary adenopathy    right axillary adenopathy noted on CT chest (03/06/2012)  . Chronic right-sided CHF (congestive heart failure) (HCC) 03/23/2012   with cor pulmonale. Last RHC 03/2012  . COPD (chronic obstructive pulmonary disease) (HCC)    PFTS 03/08/12: fev1 0.58L/1%, FVC 1.18/33%, Ratop 49 and c/w ssevere obstruction. 21% BD response on FVC, RV 219%, DLCO 11/54%  . Diabetes mellitus without complication (HCC)   . DVT of upper extremity (deep vein thrombosis) [facility]) April 2013   right subclavian // Unclear precipitating cause - possibly significant right heart failure, with vascular stasis potentially predisposing to clotting.    . Exertional shortness of breath   . Hepatic cirrhosis (HCC)    Questionable history of - Noted on CT abdomen (03/2012) - thought to be due to vascular congestion from right heart failure +/- patient's history of alcohol abuse  . History of alcoholism (HCC)   . History of chronic bronchitis   . Irregular menses   . Migraine    "~ 1/yr" (05/03/2013)  . Moderate to severe pulmonary hypertension [facility]) April 2013   Cardiac cath on 03/06/12 - 1. Elevated pulmonary artery pressures, right sided filling pressures.,  2. PA: 64/45  (mean 53)    . On home oxygen therapy    "2-3 L 24/7" (05/03/2013)  . OSA (obstructive sleep apnea)    wears noctural BiPAP (05/03/2013)  . Periodontal disease   . Pneumonia ~ 1985; 01/2011  . Pulmonary embolus [facility]) April 2013   Precipitating cause unclear. Was treated with coumadin from April-June 2013.  . Pulmonary hypertension [facility])     Patient Active Problem List   Diagnosis Date Noted  . Chronic pain 08/30/2018  . COPD exacerbation (HCC) 07/03/2018  . COPD with exacerbation (HCC) 05/03/2018  . COPD with acute exacerbation (HCC) 01/11/2018  . Left flank pain 11/01/2017  . Positive D dimer   . Leukocytosis 04/29/2017  . Acute exacerbation of chronic obstructive pulmonary disease (COPD) (HCC) 04/24/2017  . 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 Left ankle sprain 04/21/2017  . Fungemia 02/13/2017  . COPD (chronic obstructive pulmonary disease) (HCC) 02/13/2017  . Chronic respiratory failure with hypoxia and hypercapnia (HCC) 02/13/2017  . Alcohol abuse 01/31/2017  . Acute on chronic respiratory failure with hypoxia and hypercapnia (HCC)   . Tracheomalacia   . Palliative care by specialist   . Anxiety state   . Advance directive declined by patient   . Goals of care, counseling/discussion   . Chronic hoarseness 07/27/2016  . Diabetic neuropathy (HCC) 04/30/2016  . Diabetes mellitus type 2 in obese (HCC) 04/10/2016  . Chronic diastolic heart failure (HCC) 01/25/2016  .  PAH (pulmonary artery hypertension) (HCC)   . Constipation 11/18/2015  . Morbid obesity (HCC) 11/18/2015  . Lung nodule 01/30/2015  . Pulmonary mass 01/13/2015  . Gastroparesis   . Esophageal reflux   . Obstipation   . Hypokalemia 02/28/2013  . Financial difficulties 02/28/2013  . Obesity hypoventilation syndrome (HCC) 02/28/2013  . Myalgia 02/28/2013  . Anovulation 06/30/2012  . Preventative health care 05/05/2012  . Irregular menstrual cycle 04/27/2012  . Chronic right-sided CHF (congestive  heart failure) (HCC) 03/23/2012  . Pulmonary hypertension (HCC) 03/23/2012  . Cor pulmonale (HCC) 03/21/2012  . OSA on BiPAP 03/19/2012  . Axillary lymphadenopathy 03/06/2012  . Congenital heart disease 01/28/2012  . Dental caries 01/28/2012    Past Surgical History:  Procedure Laterality Date  . APPENDECTOMY  05/03/2013  . CARDIAC CATHETERIZATION  03/2012  . CARDIAC CATHETERIZATION N/A 01/08/2016   Procedure: Right Heart Cath;  Surgeon: Dolores Patty, MD;  Location: [facility] INVASIVE CV LAB;  Service: Cardiovascular;  Laterality: N/A;  . CARDIAC SURGERY  09-06-77   "my heart was backwards" (05/03/2013)  . LAPAROSCOPIC APPENDECTOMY N/A 05/03/2013   Procedure: APPENDECTOMY LAPAROSCOPIC;  Surgeon: Cherylynn Ridges, MD;  Location: [facility] OR;  Service: General;  Laterality: N/A;  . LUNG SURGERY  10-24-1977  . RIGHT HEART CATHETERIZATION N/A 03/07/2012   Procedure: RIGHT HEART CATH;  Surgeon: Kathleene Hazel, MD;  Location: [facility] CATH LAB;  Service: Cardiovascular;  Laterality: N/A;     OB History   None      Home Medications    Prior to Admission medications   Medication Sig Start Date End Date Taking? Authorizing Provider  albuterol (PROVENTIL HFA;VENTOLIN HFA) 108 (90 Base) MCG/ACT inhaler USE 2 PUFFS EVERY 6 HOURS AS NEEDED FOR WHEEZING 06/09/18  Yes Nyoka Cowden, MD  budesonide-formoterol (SYMBICORT) 160-4.5 MCG/ACT inhaler Inhale 2 puffs into the lungs daily. 04/19/18  Yes Kalman Shan, MD  gabapentin (NEURONTIN) 300 MG capsule TAKE 2 CAPSULES (600 MG TOTAL) BY MOUTH 3 (THREE) TIMES DAILY. 04/17/18  Yes Corwin Levins, MD  glucose blood (ONE TOUCH ULTRA TEST) test strip Use as instructed four times per day E11.9 05/24/18  Yes Corwin Levins, MD  HUMALOG KWIKPEN 100 UNIT/ML KiwkPen Inject 15 mg into the skin 3 (three) times daily with meals. 05/24/18  Yes [provider]  Insulin Glargine (BASAGLAR KWIKPEN) 100 UNIT/ML SOPN Inject 0.25 mLs (25 Units total) into the skin daily at  10 pm. E11.9 05/24/18  Yes Corwin Levins, MD  Insulin Pen Needle (BD PEN NEEDLE NANO U/F) 32G X 4 MM MISC Use as directed four times per day daily E11.9 05/24/18  Yes Corwin Levins, MD  pantoprazole (PROTONIX) 40 MG tablet Take 1 tablet (40 mg total) by mouth 2 (two) times daily before a meal. 07/22/16  Yes Jeralyn Bennett, MD  pregabalin (LYRICA) 50 MG capsule Take 1 capsule (50 mg total) by mouth 2 (two) times daily. 10/31/17  Yes Plotnikov, Georgina Quint, MD  SPIRIVA RESPIMAT 1.25 MCG/ACT AERS INHALE 2 PUFFS INTO THE LUNGS DAILY. 06/14/18  Yes Kalman Shan, MD  torsemide (DEMADEX) 20 MG tablet TAKE 1 TABLET BY MOUTH EVERY DAY 08/24/18  Yes Corwin Levins, MD  traMADol (ULTRAM) 50 MG tablet Take 1 tablet (50 mg total) by mouth every 6 (six) hours as needed. 08/30/18  Yes Corwin Levins, MD  atorvastatin (LIPITOR) 40 MG tablet Take 1 tablet (40 mg total) by mouth daily. Patient not taking: Reported on 09/12/2018 08/30/18  Corwin Levins, MD  chlorhexidine (PERIDEX) 0.12 % solution Use as directed 15 mLs in the mouth or throat 4 (four) times daily. Patient not taking: Reported on 09/12/2018 07/06/18   Burnadette Pop, MD  lidocaine (XYLOCAINE) 2 % solution Use as directed 15 mLs in the mouth or throat every 6 (six) hours as needed for mouth pain. Patient not taking: Reported on 09/12/2018 07/06/18   Burnadette Pop, MD    Family History Family History  Problem Relation Age of Onset  . Hypertension Father   . Heart disease Father        CHF; died age 66   . Alcohol abuse Father   . Other Brother        died age 9 y.o overdose    Social History Social History   Tobacco Use  . Smoking status: Current Some Day Smoker    Packs/day: 0.25    Years: 25.00    Pack years: 6.25    Types: Cigarettes    Last attempt to quit: 01/08/2018    Years since quitting: 0.6  . Smokeless tobacco: Never Used  . Tobacco comment: a couple drags of one cigarettes  Substance Use Topics  . Alcohol use: Not Currently  .  Drug use: No     Allergies   Bee venom   Review of Systems Review of Systems  Unable to perform ROS: Acuity of condition     Physical Exam Updated Vital Signs BP 116/74   Pulse 99   Temp 98.5 F (36.9 C)   Resp (!) 22   LMP 09/05/2018   SpO2 (!) 89%   Physical Exam  Constitutional: She is oriented to person, place, and time.  Dyspneic, using accessory muscles, tachypneic  HENT:  Head: Normocephalic and atraumatic.  Eyes: Conjunctivae are normal.  Neck: Neck supple.  Cardiovascular: Normal rate and regular rhythm.  Pulmonary/Chest:  Bilateral expiratory wheezes.  Abdominal: Soft. Bowel sounds are normal.  Musculoskeletal: Normal range of motion.  Neurological: She is alert and oriented to person, place, and time.  Skin: Skin is warm and dry.  Psychiatric: She has a normal mood and affect. Her behavior is normal.  Nursing note and vitals reviewed.    ED Treatments / Results  Labs (all labs ordered are listed, but only abnormal results are displayed) Labs Reviewed  BASIC METABOLIC PANEL - Abnormal; Notable for the following components:      Result Value   Chloride 91 (*)    CO2 36 (*)    Glucose, Bld 185 (*)    BUN 5 (*)    Creatinine, Ser 0.43 (*)    Calcium 8.8 (*)    All other components within normal limits  CBC - Abnormal; Notable for the following components:   MCV 104.1 (*)    Platelets 87 (*)    All other components within normal limits  BRAIN NATRIURETIC PEPTIDE    EKG EKG Interpretation  Date/Time:  Tuesday September 12 2018 06:19:37 EDT Ventricular Rate:  102 PR Interval:    QRS Duration: 91 QT Interval:  367 QTC Calculation: 479 R Axis:   102 Text Interpretation:  Sinus tachycardia Right atrial enlargement Probable lateral infarct, old Rightward axis Baseline wander No significant change since last tracing 03 Jul 2018 Confirmed by Devoria Albe ([id number]) on 09/12/2018 6:32:29 AM   Radiology Dg Chest Port 1 View  Result Date:  09/12/2018 CLINICAL DATA:  History of COPD. Became short of breath. Shortness of breath for a few days  worsening this morning. EXAM: PORTABLE CHEST 1 VIEW COMPARISON:  07/03/2018 FINDINGS: Cardiac enlargement. Probable emphysematous changes in the lungs. No airspace disease or consolidation. No blunting of costophrenic angles. No pneumothorax. Mediastinal contours appear intact. IMPRESSION: Cardiac enlargement with probable emphysematous changes in the lungs. No evidence of active pulmonary disease. Electronically Signed   By: Burman Nieves M.D.   On: 09/12/2018 06:58    Procedures Procedures (including critical care time)  Medications Ordered in ED Medications  ipratropium-albuterol (DUONEB) 0.5-2.5 (3) MG/3ML nebulizer solution 3 mL (has no administration in time range)  albuterol (PROVENTIL) (2.5 MG/3ML) 0.083% nebulizer solution 5 mg (5 mg Nebulization Given 09/12/18 0630)  methylPREDNISolone sodium succinate (SOLU-MEDROL) 125 mg/2 mL injection 125 mg (125 mg Intravenous Given 09/12/18 0651)  magnesium sulfate IVPB 2 g 50 mL ( Intravenous Stopped 09/12/18 0746)  albuterol (PROVENTIL,VENTOLIN) solution continuous neb (10 mg/hr Nebulization Given 09/12/18 0645)     Initial Impression / Assessment and Plan / ED Course  I have reviewed the triage vital signs and the nursing notes.  Pertinent labs & imaging results that were available during my care of the patient were reviewed by me and considered in my medical decision making (see chart for details).     Patient with a known history of COPD presents with respiratory extremis.  She has been given an extended nebulizer treatment, IV steroids, IV magnesium.  Chest x-ray negative for pneumonia.  Admit for further evaluation.   CRITICAL CARE Performed by: Donnetta Hutching Total critical care time: 30 minutes Critical care time was exclusive of separately billable procedures and treating other patients. Critical care was necessary to treat or  prevent imminent or life-threatening deterioration. Critical care was time spent personally by me on the following activities: development of treatment plan with patient and/or surrogate as well as nursing, discussions with consultants, evaluation of patient's response to treatment, examination of patient, obtaining history from patient or surrogate, ordering and performing treatments and interventions, ordering and review of laboratory studies, ordering and review of radiographic studies, pulse oximetry and re-evaluation of patient's condition.  Final Clinical Impressions(s) / ED Diagnoses   Final diagnoses:  COPD exacerbation [facility])    ED Discharge Orders    None       Donnetta Hutching, MD 09/12/18 1153

## 2018-09-12 NOTE — Progress Notes (Signed)
Pt talked on the telephone during her entire nebulizer treatment

## 2018-09-12 NOTE — ED Triage Notes (Signed)
Pt comes from home via [facility]. Pt states she became SOB around 0400 this AM. Pt states that she has taken 1 albuterol treatment and turned her home O2 up to 4L. PT normally on 3L. PT 86% on 4L at this time. Pt supposed to be taking lasix but due to her moving has missed "a few doses."

## 2018-09-13 DIAGNOSIS — R0602 Shortness of breath: Secondary | ICD-10-CM | POA: Diagnosis present

## 2018-09-13 DIAGNOSIS — D696 Thrombocytopenia, unspecified: Secondary | ICD-10-CM | POA: Diagnosis present

## 2018-09-13 DIAGNOSIS — Z7951 Long term (current) use of inhaled steroids: Secondary | ICD-10-CM | POA: Diagnosis not present

## 2018-09-13 DIAGNOSIS — Z8249 Family history of ischemic heart disease and other diseases of the circulatory system: Secondary | ICD-10-CM | POA: Diagnosis not present

## 2018-09-13 DIAGNOSIS — Z79899 Other long term (current) drug therapy: Secondary | ICD-10-CM | POA: Diagnosis not present

## 2018-09-13 DIAGNOSIS — J9621 Acute and chronic respiratory failure with hypoxia: Secondary | ICD-10-CM | POA: Diagnosis not present

## 2018-09-13 DIAGNOSIS — K7460 Unspecified cirrhosis of liver: Secondary | ICD-10-CM | POA: Diagnosis present

## 2018-09-13 DIAGNOSIS — I5032 Chronic diastolic (congestive) heart failure: Secondary | ICD-10-CM | POA: Diagnosis not present

## 2018-09-13 DIAGNOSIS — Z91030 Bee allergy status: Secondary | ICD-10-CM | POA: Diagnosis not present

## 2018-09-13 DIAGNOSIS — G4733 Obstructive sleep apnea (adult) (pediatric): Secondary | ICD-10-CM | POA: Diagnosis present

## 2018-09-13 DIAGNOSIS — J441 Chronic obstructive pulmonary disease with (acute) exacerbation: Secondary | ICD-10-CM | POA: Diagnosis not present

## 2018-09-13 DIAGNOSIS — E871 Hypo-osmolality and hyponatremia: Secondary | ICD-10-CM | POA: Diagnosis present

## 2018-09-13 DIAGNOSIS — Z9049 Acquired absence of other specified parts of digestive tract: Secondary | ICD-10-CM | POA: Diagnosis not present

## 2018-09-13 DIAGNOSIS — I2729 Other secondary pulmonary hypertension: Secondary | ICD-10-CM | POA: Diagnosis present

## 2018-09-13 DIAGNOSIS — Z794 Long term (current) use of insulin: Secondary | ICD-10-CM | POA: Diagnosis not present

## 2018-09-13 DIAGNOSIS — Z86718 Personal history of other venous thrombosis and embolism: Secondary | ICD-10-CM | POA: Diagnosis not present

## 2018-09-13 DIAGNOSIS — T380X5A Adverse effect of glucocorticoids and synthetic analogues, initial encounter: Secondary | ICD-10-CM | POA: Diagnosis present

## 2018-09-13 DIAGNOSIS — Z9981 Dependence on supplemental oxygen: Secondary | ICD-10-CM | POA: Diagnosis not present

## 2018-09-13 DIAGNOSIS — E1169 Type 2 diabetes mellitus with other specified complication: Secondary | ICD-10-CM | POA: Diagnosis not present

## 2018-09-13 DIAGNOSIS — E785 Hyperlipidemia, unspecified: Secondary | ICD-10-CM | POA: Diagnosis present

## 2018-09-13 DIAGNOSIS — E1165 Type 2 diabetes mellitus with hyperglycemia: Secondary | ICD-10-CM | POA: Diagnosis present

## 2018-09-13 DIAGNOSIS — Z811 Family history of alcohol abuse and dependence: Secondary | ICD-10-CM | POA: Diagnosis not present

## 2018-09-13 DIAGNOSIS — F17210 Nicotine dependence, cigarettes, uncomplicated: Secondary | ICD-10-CM | POA: Diagnosis present

## 2018-09-13 DIAGNOSIS — Z86711 Personal history of pulmonary embolism: Secondary | ICD-10-CM | POA: Diagnosis not present

## 2018-09-13 DIAGNOSIS — E1140 Type 2 diabetes mellitus with diabetic neuropathy, unspecified: Secondary | ICD-10-CM | POA: Diagnosis present

## 2018-09-13 DIAGNOSIS — F1021 Alcohol dependence, in remission: Secondary | ICD-10-CM | POA: Diagnosis present

## 2018-09-13 LAB — GLUCOSE, CAPILLARY
GLUCOSE-CAPILLARY: 563 mg/dL — AB (ref 70–99)
Glucose-Capillary: 416 mg/dL — ABNORMAL HIGH (ref 70–99)
Glucose-Capillary: 302 mg/dL — ABNORMAL HIGH (ref 70–99)
Glucose-Capillary: 433 mg/dL — ABNORMAL HIGH (ref 70–99)
Glucose-Capillary: 281 mg/dL — ABNORMAL HIGH (ref 70–99)
Glucose-Capillary: 475 mg/dL — ABNORMAL HIGH (ref 70–99)

## 2018-09-13 LAB — BASIC METABOLIC PANEL
Anion gap: 10 (ref 5–15)
BUN: 12 mg/dL (ref 6–20)
CHLORIDE: 86 mmol/L — AB (ref 98–111)
CO2: 36 mmol/L — AB (ref 22–32)
CREATININE: 0.57 mg/dL (ref 0.44–1.00)
Calcium: 8.6 mg/dL — ABNORMAL LOW (ref 8.9–10.3)
GFR calc Af Amer: >60 mL/min (ref >60)
GFR calc non Af Amer: >60 mL/min (ref >60)
GLUCOSE: 341 mg/dL — AB (ref 70–99)
Potassium: 4 mmol/L (ref 3.5–5.1)
Sodium: 132 mmol/L — ABNORMAL LOW (ref 135–145)

## 2018-09-13 LAB — CBC
HEMATOCRIT: 42.6 % (ref 36.0–46.0)
Hemoglobin: 13.4 g/dL (ref 12.0–15.0)
MCH: 32.4 pg (ref 26.0–34.0)
MCHC: 31.5 g/dL (ref 30.0–36.0)
MCV: 102.9 fL — AB (ref 80.0–100.0)
Platelets: 100 10*3/uL — ABNORMAL LOW (ref 150–400)
RBC: 4.14 MIL/uL (ref 3.87–5.11)
RDW: 14.2 % (ref 11.5–15.5)
WBC: 9.6 10*3/uL (ref 4.0–10.5)
nRBC: 0 % (ref 0.0–0.2)

## 2018-09-13 LAB — GLUCOSE, RANDOM: Glucose, Bld: 582 mg/dL (ref 70–99)

## 2018-09-13 LAB — HIV ANTIBODY (ROUTINE TESTING W REFLEX): HIV Screen 4th Generation wRfx: NONREACTIVE

## 2018-09-13 MED ORDER — POLYETHYLENE GLYCOL 3350 17 G PO PACK
17.0000 g | PACK | Freq: Every day | ORAL | Status: DC
  Administered 2018-09-13 – 2018-09-15 (×3): 17 g via ORAL
  Filled 2018-09-13 (×3): qty 1

## 2018-09-13 MED ORDER — SENNA 8.6 MG PO TABS
2.0000 | ORAL_TABLET | Freq: Every day | ORAL | Status: DC
  Administered 2018-09-13 – 2018-09-15 (×3): 17.2 mg via ORAL
  Filled 2018-09-13 (×3): qty 2

## 2018-09-13 MED ORDER — INSULIN ASPART 100 UNIT/ML ~~LOC~~ SOLN
25.0000 [IU] | Freq: Once | SUBCUTANEOUS | Status: AC
  Administered 2018-09-13: 25 [IU] via SUBCUTANEOUS

## 2018-09-13 MED ORDER — INSULIN ASPART 100 UNIT/ML IV SOLN
8.0000 [IU] | Freq: Once | INTRAVENOUS | Status: AC
  Administered 2018-09-14: 8 [IU] via INTRAVENOUS

## 2018-09-13 MED ORDER — BUDESONIDE 0.5 MG/2ML IN SUSP
0.5000 mg | Freq: Two times a day (BID) | RESPIRATORY_TRACT | Status: DC
  Administered 2018-09-13 – 2018-09-15 (×4): 0.5 mg via RESPIRATORY_TRACT
  Filled 2018-09-13 (×4): qty 2

## 2018-09-13 MED ORDER — TRAZODONE HCL 50 MG PO TABS
50.0000 mg | ORAL_TABLET | Freq: Every day | ORAL | Status: DC
  Administered 2018-09-13 – 2018-09-14 (×2): 50 mg via ORAL
  Filled 2018-09-13 (×2): qty 1

## 2018-09-13 MED ORDER — METHYLPREDNISOLONE SODIUM SUCC 125 MG IJ SOLR
60.0000 mg | Freq: Once | INTRAMUSCULAR | Status: AC
  Administered 2018-09-13: 60 mg via INTRAVENOUS
  Filled 2018-09-13: qty 2

## 2018-09-13 MED ORDER — INSULIN ASPART 100 UNIT/ML ~~LOC~~ SOLN
0.0000 [IU] | Freq: Three times a day (TID) | SUBCUTANEOUS | Status: DC
  Administered 2018-09-13 – 2018-09-14 (×2): 11 [IU] via SUBCUTANEOUS
  Administered 2018-09-14: 20 [IU] via SUBCUTANEOUS
  Administered 2018-09-15: 4 [IU] via SUBCUTANEOUS
  Administered 2018-09-15: 7 [IU] via SUBCUTANEOUS
  Administered 2018-09-15: 20 [IU] via SUBCUTANEOUS

## 2018-09-13 MED ORDER — PREDNISONE 20 MG PO TABS
60.0000 mg | ORAL_TABLET | Freq: Every day | ORAL | Status: DC
  Administered 2018-09-14 – 2018-09-15 (×2): 60 mg via ORAL
  Filled 2018-09-13 (×2): qty 3

## 2018-09-13 MED ORDER — INSULIN ASPART 100 UNIT/ML ~~LOC~~ SOLN: 0.0000 [IU] | Freq: Every day | SUBCUTANEOUS | Status: DC

## 2018-09-13 MED ORDER — POTASSIUM CHLORIDE IN NACL 20-0.45 MEQ/L-% IV SOLN
INTRAVENOUS | Status: AC
  Filled 2018-09-13: qty 1000

## 2018-09-13 MED ORDER — POTASSIUM CHLORIDE IN NACL 20-0.45 MEQ/L-% IV SOLN
INTRAVENOUS | Status: AC
  Administered 2018-09-14: 01:00:00 via INTRAVENOUS
  Filled 2018-09-13: qty 1000

## 2018-09-13 NOTE — Progress Notes (Signed)
Patient using hospital cpap machine independently. Will call if further assistance is needed.

## 2018-09-13 NOTE — Progress Notes (Signed)
Night shift floor coverage note.   The patient blood glucose increased to 581 mg/dL on BMP.  She had before today's CBGs showing similar numbers.  Her hemoglobin A1c was 6.5% about 2 weeks ago.  A 0.45% sodium chloride plus KCl 20 mEq 1000 mL bolus plus insulin 8 units IV given.  Follow-up CBG is still showing an elevated glucose of 431 mg/dL.  She also received Lantus 25 units SQ last night.  However, another 10 units of SQ insulin were ordered.  The patient was started on half NS at 125 mL/h.  Sanda Klein, MD.

## 2018-09-13 NOTE — Progress Notes (Signed)
Pt's CBG 433. Dr. Arbutus Leas paged and made aware.

## 2018-09-13 NOTE — Care Management Note (Signed)
Case Management Note  Patient Details  Name: Mary [name] MRN: [id number] Date of Birth: 18-Nov-1976  Subjective/Objective:        Admitted with COPD. CM consulted. Pt from home (living in [place] while she re-locates). Pt has insuracne and PCP. Pt has home O2 and neb machine. Pt ind with ADL's. PT recommends no PT f/u. 4 admissions in past 6 months.              Action/Plan: DC home with home health coverage for disease management. Pt requests [facility] be [facility] given referral, aware HH has 48 hrs to make first visit. Referral given to [facility] rep, Bonita Quin.    Expected Discharge Date:       09/14/2018           Expected Discharge Plan:  Home/Self Care  In-House Referral:  NA  Discharge planning Services  CM Consult  Post Acute Care Choice:  NA Choice offered to:  NA  DME Arranged:    DME Agency:     HH Arranged:   nursing HH Agency:   [facility]  Status of Service:  Completed, signed off  If discussed at Long Length of Stay Meetings, dates discussed:    Additional Comments:  Malcolm Metro, RN 09/13/2018, 2:57 PM

## 2018-09-13 NOTE — Evaluation (Signed)
Physical Therapy Evaluation Patient Details Name: Mary WIGGLESWORTH MRN: [id number] DOB: 03/04/1976 Today's Date: 09/13/2018   History of Present Illness  Mary [name] is a 42 y.o. female with a medical history of COPD on 3 L of home oxygen, CHF, diabetes, who presented to the emergency department with complaints of shortness of breath.  She states she has been having worsening dyspnea for the last few days however it was worse this morning at approximately 4 AM.  She tried her BiPAP at home which did not help she is also tried taking numerous nebulizer and inhaler treatments.  She has no other associated symptoms.  Denies current chest pain, abdominal pain, nausea or vomiting, diarrhea or constipation, dizziness or headache, problems with urination, recent travel or ill contacts.  She does state that she recently had to move into a hotel she is in between homes and is noticed that her breathing worsened while being at the hotel. Of note, patient follows up with pulmonology, Dr. Marchelle Gearing however missed her recent appointment.    Clinical Impression  Patient functioning near baseline for functional mobility and gait, other than occasional buckling of knees when walking in hallway without loss of balance, on 4 LPM with O2 saturation dropping to 86%, increased back to 5 LPM once back in room.  Patient encouraged to ambulate daily as tolerated with nursing staff.  Plan:  Patient discharged from physical therapy to care of nursing for ambulation daily as tolerated for length of stay.     Follow Up Recommendations No PT follow up    Equipment Recommendations  None recommended by PT    Recommendations for Other Services       Precautions / Restrictions Precautions Precautions: None Restrictions Weight Bearing Restrictions: No      Mobility  Bed Mobility Overal bed mobility: Independent                Transfers Overall transfer level: Independent                   Ambulation/Gait Ambulation/Gait assistance: Modified independent (Device/Increase time) Gait Distance (Feet): 80 Feet Assistive device: None Gait Pattern/deviations: WFL(Within Functional Limits) Gait velocity: decreased   General Gait Details: grossly WFL excpet occasional buckling of knees, but able to self correct without loss of balance, did not have to lean on side rails in hallway, on 4 LPM O2  Stairs            Wheelchair Mobility    Modified Rankin (Stroke Patients Only)       Balance Overall balance assessment: Mild deficits observed, not formally tested                                           Pertinent Vitals/Pain Pain Assessment: No/denies pain    Home Living Family/patient expects to be discharged to:: Private residence Living Arrangements: Spouse/significant other Available Help at Discharge: Family;Available PRN/intermittently Type of Home: House Home Access: Level entry(entering through garage)     Home Layout: One level 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      Prior Function Level of Independence: Independent         Comments: household and short distanced community ambulator, 3 LPM O2 dependent     Hand Dominance   Dominant Hand: Right    Extremity/Trunk Assessment   Upper Extremity Assessment Upper Extremity Assessment: Defer to OT evaluation  Lower Extremity Assessment Lower Extremity Assessment: Overall WFL for tasks assessed    Cervical / Trunk Assessment Cervical / Trunk Assessment: Normal  Communication   Communication: No difficulties  Cognition Arousal/Alertness: Awake/alert Behavior During Therapy: WFL for tasks assessed/performed Overall Cognitive Status: Within Functional Limits for tasks assessed                                        General Comments      Exercises     Assessment/Plan    PT Assessment Patent does not need any further PT services  PT Problem List          PT Treatment Interventions      PT Goals (Current goals can be found in the Care Plan section)  Acute Rehab PT Goals Patient Stated Goal: return home with spouse to assist PT Goal Formulation: With patient Time For Goal Achievement: 09/13/18 Potential to Achieve Goals: Good    Frequency     Barriers to discharge        Co-evaluation               AM-PAC PT "6 Clicks" Daily Activity  Outcome Measure Difficulty turning over in bed (including adjusting bedclothes, sheets and blankets)?: None Difficulty moving from lying on back to sitting on the side of the bed? : None Difficulty sitting down on and standing up from a chair with arms (e.g., wheelchair, bedside commode, etc,.)?: None Help needed moving to and from a bed to chair (including a wheelchair)?: None Help needed walking in hospital room?: None Help needed climbing 3-5 steps with a railing? : A Little 6 Click Score: 23    End of Session Equipment Utilized During Treatment: Oxygen Activity Tolerance: Patient tolerated treatment well Patient left: in bed;with call bell/phone within reach(seated at bedside) Nurse Communication: Mobility status PT Visit Diagnosis: Unsteadiness on feet (R26.81);Other abnormalities of gait and mobility (R26.89);Muscle weakness (generalized) (M62.81)    Time: 4401-0272 PT Time Calculation (min) (ACUTE ONLY): 22 min   Charges:   PT Evaluation $PT Eval Moderate Complexity: 1 Mod PT Treatments $Therapeutic Activity: 23-37 mins        2:56 PM, 09/13/18 Ocie Bob, MPT Physical Therapist with [facility] [phone] office [phone] mobile phone

## 2018-09-13 NOTE — Progress Notes (Signed)
PROGRESS NOTE  Mary [name] VHQ:[id number] DOB: 1976-10-17 DOA: 09/12/2018 PCP: Corwin Levins, MD  Brief History:  42 year old female with history of COPD and chronic respiratory failure on 3 L at home, pulmonary hypertension, diastolic CHF, diabetes mellitus, and liver cirrhosis presenting with shortness of breath that began on the morning of 09/12/2018.  The patient denied any fevers, chills, hemoptysis,  vomiting, diarrhea.  She attributes her shortness of breath to temporarily living in a motel while she transitions to moving to [place], [place].  The patient states that she continues to intermittently smoke.  She states that, " I taken few drags occasionally."  She states that otherwise she quit smoking 1 month prior to this admission.  She denies any recent travels or sick contacts, but states that her husband continues to smoke.  The patient had a previous history of alcohol abuse drinking vodka daily, but she states that she has not drank any alcohol in 4 months.  The patient tried using her bronchodilators and nebulizers without relief.  As result, she presented to the hospital for further evaluation.  Chest x-ray showed cardiomegaly with emphysematous changes.  The patient was started on IV steroids and bronchodilators.  Assessment/Plan: Acute on chronic respiratory failure with hypoxia -Secondary to COPD exacerbation -Presently on 5 L -Wean oxygen back to baseline 3 L for saturation greater 92% -Personally reviewed chest x-ray--no infiltrates or edema  COPD exacerbation -Start Pulmicort -Continue bronchodilators -Wean Solu-Medrol -Hold Spiriva -Incentive spirometry -Continue Mucinex  Chronic diastolic CHF -02/01/2017 echo EF 65 to 70%, grade 1 DD, PASP 42, mild RV dilatation -Daily weights -Continue torsemide -Clinically compensated  Diabetes mellitus type 2, uncontrolled with hyperglycemia -In part due to steroids -Continue Lantus 25 units -Resistant  NovoLog sliding scale -08/30/2018 hemoglobin A1c 6.5  Diabetic neuropathy -Continue gabapentin, Lyrica  Hyperlipidemia -Continue statin -She states her her doctor sent this medication and for her but did not discuss her lipid results.  Have gone over her lipid profile.  Chronic thrombocytopenia -continue to monitor CBC -due to liver cirrhosis  Hyponatremia -due to CHF and liver cirrhosis -stable -am BMP     Disposition Plan:   Home  09/14/18 or 09/15/18 if stable Family Communication:   No Family at bedside--Total time spent 35 minutes.  Greater than 50% spent face to face counseling and coordinating care.   Consultants:  none  Code Status:  FULL  DVT Prophylaxis:  [place] Heparin / [place] Lovenox   Procedures: As Listed in Progress Note Above  Antibiotics: None    Subjective:   Objective: Vitals:   09/13/18 0247 09/13/18 0607 09/13/18 0735 09/13/18 0738  BP:  120/74    Pulse:  66    Resp:      Temp:  97.7 F (36.5 C)    TempSrc:  Oral    SpO2: 94% 95% 94% 98%  Weight:      Height:        Intake/Output Summary (Last 24 hours) at 09/13/2018 1005 Last data filed at 09/13/2018 0855 Gross per 24 hour  Intake -  Output 2600 ml  Net -2600 ml   Weight change:  Exam:   General:  Pt is alert, follows commands appropriately, not in acute distress  HEENT: No icterus, No thrush, No neck mass, [place]/AT  Cardiovascular: RRR, S1/S2, no rubs, no gallops  Respiratory: CTA bilaterally, no wheezing, no crackles, no rhonchi  Abdomen: Soft/+BS, non tender, non distended, no guarding  Extremities: No edema, No lymphangitis, No petechiae, No rashes, no synovitis   Data Reviewed: I have personally reviewed following labs and imaging studies Basic Metabolic Panel: Recent Labs  Lab 09/12/18 0720 09/13/18 0552  NA 136 132*  K 3.6 4.0  CL 91* 86*  CO2 36* 36*  GLUCOSE 185* 341*  BUN 5* 12  CREATININE 0.43* 0.57  CALCIUM 8.8* 8.6*   Liver Function Tests: No  results for input(s): AST, ALT, ALKPHOS, BILITOT, PROT, ALBUMIN in the last 168 hours. No results for input(s): LIPASE, AMYLASE in the last 168 hours. No results for input(s): AMMONIA in the last 168 hours. Coagulation Profile: No results for input(s): INR, PROTIME in the last 168 hours. CBC: Recent Labs  Lab 09/12/18 0720 09/13/18 0552  WBC 7.3 9.6  HGB 13.3 13.4  HCT 42.7 42.6  MCV 104.1* 102.9*  PLT 87* 100*   Cardiac Enzymes: No results for input(s): CKTOTAL, CKMB, CKMBINDEX, TROPONINI in the last 168 hours. BNP: Invalid input(s): POCBNP CBG: Recent Labs  Lab 09/12/18 2207 09/12/18 2323 09/13/18 0112 09/13/18 0716  GLUCAP 421* 429* 416* 302*   HbA1C: No results for input(s): HGBA1C in the last 72 hours. Urine analysis:    Component Value Date/Time   COLORURINE YELLOW 05/12/2018 1112   APPEARANCEUR HAZY (A) 05/12/2018 1112   LABSPEC 1.030 05/12/2018 1112   PHURINE 6.0 05/12/2018 1112   GLUCOSEU >=500 (A) 05/12/2018 1112   GLUCOSEU 100 (A) 02/24/2018 1644   HGBUR NEGATIVE 05/12/2018 1112   BILIRUBINUR NEGATIVE 05/12/2018 1112   KETONESUR 5 (A) 05/12/2018 1112   PROTEINUR NEGATIVE 05/12/2018 1112   UROBILINOGEN 2.0 (A) 02/24/2018 1644   NITRITE NEGATIVE 05/12/2018 1112   LEUKOCYTESUR MODERATE (A) 05/12/2018 1112   Sepsis Labs: @LABRCNTIP (procalcitonin:4,lacticidven:4) )No results found for this or any previous visit (from the past 240 hour(s)).   Scheduled Meds: . atorvastatin  40 mg Oral Daily  . dextromethorphan-guaiFENesin  1 tablet Oral BID  . gabapentin  600 mg Oral TID  . insulin glargine  25 Units Subcutaneous Q2200  . ipratropium-albuterol  3 mL Nebulization Q6H  . methylPREDNISolone (SOLU-MEDROL) injection  60 mg Intravenous Q6H   Followed by  . [START ON 09/14/2018] predniSONE  40 mg Oral Q breakfast  . mometasone-formoterol  2 puff Inhalation BID  . pantoprazole  40 mg Oral BID AC  . pregabalin  50 mg Oral BID  . sodium chloride flush  3 mL  Intravenous Q12H  . torsemide  20 mg Oral Daily   Continuous Infusions: . sodium chloride      Procedures/Studies: Dg Chest Port 1 View  Result Date: 09/12/2018 CLINICAL DATA:  History of COPD. Became short of breath. Shortness of breath for a few days worsening this morning. EXAM: PORTABLE CHEST 1 VIEW COMPARISON:  07/03/2018 FINDINGS: Cardiac enlargement. Probable emphysematous changes in the lungs. No airspace disease or consolidation. No blunting of costophrenic angles. No pneumothorax. Mediastinal contours appear intact. IMPRESSION: Cardiac enlargement with probable emphysematous changes in the lungs. No evidence of active pulmonary disease. Electronically Signed   By: Burman Nieves M.D.   On: 09/12/2018 06:58    Catarina Hartshorn, DO  [facility] Pager [phone]  If 7PM-7AM, please contact night-coverage [email or web address] Password [email or web address] 09/13/2018, 10:05 AM   LOS: 0 days

## 2018-09-14 LAB — GLUCOSE, CAPILLARY
GLUCOSE-CAPILLARY: 420 mg/dL — AB (ref 70–99)
Glucose-Capillary: 431 mg/dL — ABNORMAL HIGH (ref 70–99)
Glucose-Capillary: 306 mg/dL — ABNORMAL HIGH (ref 70–99)
Glucose-Capillary: 357 mg/dL — ABNORMAL HIGH (ref 70–99)
Glucose-Capillary: 275 mg/dL — ABNORMAL HIGH (ref 70–99)
Glucose-Capillary: 455 mg/dL — ABNORMAL HIGH (ref 70–99)

## 2018-09-14 LAB — BASIC METABOLIC PANEL
Anion gap: 10 (ref 5–15)
BUN: 19 mg/dL (ref 6–20)
CALCIUM: 8.6 mg/dL — AB (ref 8.9–10.3)
CO2: 32 mmol/L (ref 22–32)
CREATININE: 0.77 mg/dL (ref 0.44–1.00)
Chloride: 88 mmol/L — ABNORMAL LOW (ref 98–111)
GFR calc non Af Amer: >60 mL/min (ref >60)
Glucose, Bld: 581 mg/dL (ref 70–99)
Potassium: 5 mmol/L (ref 3.5–5.1)
Sodium: 130 mmol/L — ABNORMAL LOW (ref 135–145)

## 2018-09-14 LAB — MAGNESIUM: Magnesium: 2.3 mg/dL (ref 1.7–2.4)

## 2018-09-14 LAB — PHOSPHORUS: PHOSPHORUS: 2.8 mg/dL (ref 2.5–4.6)

## 2018-09-14 MED ORDER — INSULIN ASPART 100 UNIT/ML ~~LOC~~ SOLN
12.0000 [IU] | Freq: Three times a day (TID) | SUBCUTANEOUS | Status: DC
  Administered 2018-09-14 – 2018-09-15 (×4): 12 [IU] via SUBCUTANEOUS

## 2018-09-14 MED ORDER — INSULIN ASPART 100 UNIT/ML ~~LOC~~ SOLN
20.0000 [IU] | Freq: Once | SUBCUTANEOUS | Status: AC
  Administered 2018-09-14: 20 [IU] via SUBCUTANEOUS

## 2018-09-14 MED ORDER — SODIUM CHLORIDE 0.45 % IV SOLN
INTRAVENOUS | Status: DC
  Administered 2018-09-14 (×2): via INTRAVENOUS

## 2018-09-14 MED ORDER — SODIUM CHLORIDE 0.45 % IV BOLUS
1000.0000 mL | Freq: Once | INTRAVENOUS | Status: AC
  Administered 2018-09-14: 1000 mL via INTRAVENOUS

## 2018-09-14 MED ORDER — INSULIN ASPART 100 UNIT/ML ~~LOC~~ SOLN
10.0000 [IU] | Freq: Once | SUBCUTANEOUS | Status: AC
  Administered 2018-09-14: 10 [IU] via SUBCUTANEOUS

## 2018-09-14 MED ORDER — SODIUM CHLORIDE 0.9 % IV SOLN
INTRAVENOUS | Status: DC
  Administered 2018-09-14 – 2018-09-15 (×3): via INTRAVENOUS

## 2018-09-14 MED ORDER — INSULIN GLARGINE 100 UNIT/ML ~~LOC~~ SOLN
40.0000 [IU] | Freq: Every day | SUBCUTANEOUS | Status: DC
  Administered 2018-09-14: 40 [IU] via SUBCUTANEOUS
  Filled 2018-09-14 (×4): qty 0.4

## 2018-09-14 MED ORDER — INSULIN ASPART 100 UNIT/ML ~~LOC~~ SOLN
30.0000 [IU] | Freq: Once | SUBCUTANEOUS | Status: AC
  Administered 2018-09-14: 30 [IU] via SUBCUTANEOUS

## 2018-09-14 NOTE — Progress Notes (Signed)
OT Cancellation Note  Patient Details Name: TENLEIGH BYER MRN: [id number] DOB: 1976-06-19   Cancelled Treatment:    Reason Eval/Treat Not Completed: OT screened, no needs identified, will sign off. Pt screened for OT needs. Pt is performing ADLs at baseline independence, primarily limited due to SOB on exertion. Discussed energy conservation with pt who verbalized understanding. No further OT services required at this time as pt is at baseline.   Ezra Sites, OTR/L  [phone] 09/14/2018, 9:03 AM

## 2018-09-14 NOTE — Progress Notes (Signed)
Patient using hospital bipap machine independently. Will call if assistance is needed.

## 2018-09-14 NOTE — Progress Notes (Signed)
Inpatient Diabetes Program Recommendations  AACE/ADA: New Consensus Statement on Inpatient Glycemic Control (2015)  Target Ranges:  Prepandial:   less than 140 mg/dL      Peak postprandial:   less than 180 mg/dL (1-2 hours)      Critically ill patients:  140 - 180 mg/dL   Lab Results  Component Value Date   GLUCAP 420 (H) 09/14/2018   HGBA1C 6.5 08/30/2018    Review of Glycemic Control Results for Mary [name], Mary [name] (MRN [id number]) as of 09/14/2018 11:55  Ref. Range 09/13/2018 23:31 09/14/2018 02:03 09/14/2018 04:21 09/14/2018 07:26 09/14/2018 11:15  Glucose-Capillary Latest Ref Range: 70 - 99 mg/dL 409 (HH) 811 (H) 914 (H) 357 (H) 420 (H)   Diabetes history: Type 2 DM Outpatient Diabetes medications: Basaglar 25 units QHS, Humalog 15 units TID Current orders for Inpatient glycemic control: Lantus 40 units QHS, Novolog 12 units TID, Novolog 0-20 units TID, Novolog 0-5 units QHS Prednisone 60 mg QAM  Inpatient Diabetes Program Recommendations:    Noted recent changes to inpatient orders, in agreement. Noted significant hyperglycemia in the setting of steroids.   Hopefully, with the transition to oral Prednisone and the Novolog 30 units X1 for 1200 correction, that BS will begin to trend appropriately. Will continue to follow.   Thanks, Lujean Rave, MSN, RNC-OB Diabetes Coordinator [phone] (8a-5p)

## 2018-09-14 NOTE — Progress Notes (Signed)
Dr. Arbutus Leas paged and made aware of CBG 420. New orders given and followed.

## 2018-09-14 NOTE — Progress Notes (Signed)
PROGRESS NOTE  Mary [name]:[id number] DOB: 1976/12/01 DOA: 09/12/2018 PCP: Corwin Levins, MD  Brief History:  42 year old female with history of COPD and chronic respiratory failure on 3 L at home, pulmonary hypertension, diastolic CHF, diabetes mellitus, and liver cirrhosis presenting with shortness of breath that began on the morning of 09/12/2018.  The patient denied any fevers, chills, hemoptysis,  vomiting, diarrhea.  She attributes her shortness of breath to temporarily living in a motel while she transitions to moving to [place], [place].  The patient states that she continues to intermittently smoke.  She states that, " I taken few drags occasionally."  She states that otherwise she quit smoking 1 month prior to this admission.  She denies any recent travels or sick contacts, but states that her husband continues to smoke.  The patient had a previous history of alcohol abuse drinking vodka daily, but she states that she has not drank any alcohol in 4 months.  The patient tried using her bronchodilators and nebulizers without relief.  As result, she presented to the hospital for further evaluation.  Chest x-ray showed cardiomegaly with emphysematous changes.  The patient was started on IV steroids and bronchodilators.  Assessment/Plan: Acute on chronic respiratory failure with hypoxia -Secondary to COPD exacerbation -Presently on 5 L>>>3.5L -Wean oxygen back to baseline 3 L for saturation greater 92% -Personally reviewed chest x-ray--no infiltrates or edema  COPD exacerbation -Started Pulmicort -Continue bronchodilators -Wean Solu-Medrol>>>prednisone -Hold Spiriva -Incentive spirometry -Continue Mucinex  Chronic diastolic CHF -02/01/2017 echo EF 65 to 70%, grade 1 DD, PASP 42, mild RV dilatation -Daily weights -Continue torsemide -Clinically compensated  Diabetes mellitus type 2, uncontrolled with hyperglycemia -In part due to steroids -Increase Lantus  40 units -add novolog 12 units with meals -Resistant NovoLog sliding scale -08/30/2018 hemoglobin A1c 6.5  Diabeticneuropathy -Continue gabapentin, Lyrica  Hyperlipidemia -Continue statin -She states her her doctor sent this medication and for her but did not discuss her lipid results. Have gone over her lipid profile.  Chronic thrombocytopenia -continue to monitor CBC -due to liver cirrhosis  Hyponatremia -due to hyperglycemia, CHF and liver cirrhosis -stable -am BMP     Disposition Plan:   Home  09/15/18 if stable Family Communication:   No Family at bedside--Total time spent 35 minutes.  Greater than 50% spent face to face counseling and coordinating care.   Consultants:  none  Code Status:  FULL  DVT Prophylaxis:  [place] Heparin / [place] Lovenox   Procedures: As Listed in Progress Note Above  Antibiotics: None    Subjective: Pt states she is breathing better. Still with nonproductive cough.  No n/v/d, abd pain, cp, f/c  Objective: Vitals:   09/14/18 0616 09/14/18 0743 09/14/18 1315 09/14/18 1355  BP: 119/75  131/83   Pulse: 70  88   Resp: 20  20   Temp: (!) 97.5 F (36.4 C)  97.6 F (36.4 C)   TempSrc: Oral  Oral   SpO2: 99% 97% 95% 95%  Weight:      Height:        Intake/Output Summary (Last 24 hours) at 09/14/2018 1546 Last data filed at 09/14/2018 1254 Gross per 24 hour  Intake 4833.53 ml  Output 4425 ml  Net 408.53 ml   Weight change:  Exam:   General:  Pt is alert, follows commands appropriately, not in acute distress  HEENT: No icterus, No thrush, No neck mass, [place]/AT  Cardiovascular: RRR,  S1/S2, no rubs, no gallops  Respiratory: bibasilar rales. No wheezing.  Abdomen: Soft/+BS, non tender, non distended, no guarding  Extremities: No edema, No lymphangitis, No petechiae, No rashes, no synovitis   Data Reviewed: I have personally reviewed following labs and imaging studies Basic Metabolic Panel: Recent Labs  Lab  09/12/18 0720 09/13/18 0552 09/13/18 2157 09/13/18 2353  NA 136 132*  --  130*  K 3.6 4.0  --  5.0  CL 91* 86*  --  88*  CO2 36* 36*  --  32  GLUCOSE 185* 341* 582* 581*  BUN 5* 12  --  19  CREATININE 0.43* 0.57  --  0.77  CALCIUM 8.8* 8.6*  --  8.6*  MG  --   --   --  2.3  PHOS  --   --   --  2.8   Liver Function Tests: No results for input(s): AST, ALT, ALKPHOS, BILITOT, PROT, ALBUMIN in the last 168 hours. No results for input(s): LIPASE, AMYLASE in the last 168 hours. No results for input(s): AMMONIA in the last 168 hours. Coagulation Profile: No results for input(s): INR, PROTIME in the last 168 hours. CBC: Recent Labs  Lab 09/12/18 0720 09/13/18 0552  WBC 7.3 9.6  HGB 13.3 13.4  HCT 42.7 42.6  MCV 104.1* 102.9*  PLT 87* 100*   Cardiac Enzymes: No results for input(s): CKTOTAL, CKMB, CKMBINDEX, TROPONINI in the last 168 hours. BNP: Invalid input(s): POCBNP CBG: Recent Labs  Lab 09/13/18 2331 09/14/18 0203 09/14/18 0421 09/14/18 0726 09/14/18 1115  GLUCAP 563* 431* 306* 357* 420*   HbA1C: No results for input(s): HGBA1C in the last 72 hours. Urine analysis:    Component Value Date/Time   COLORURINE YELLOW 05/12/2018 1112   APPEARANCEUR HAZY (A) 05/12/2018 1112   LABSPEC 1.030 05/12/2018 1112   PHURINE 6.0 05/12/2018 1112   GLUCOSEU >=500 (A) 05/12/2018 1112   GLUCOSEU 100 (A) 02/24/2018 1644   HGBUR NEGATIVE 05/12/2018 1112   BILIRUBINUR NEGATIVE 05/12/2018 1112   KETONESUR 5 (A) 05/12/2018 1112   PROTEINUR NEGATIVE 05/12/2018 1112   UROBILINOGEN 2.0 (A) 02/24/2018 1644   NITRITE NEGATIVE 05/12/2018 1112   LEUKOCYTESUR MODERATE (A) 05/12/2018 1112   Sepsis Labs: @LABRCNTIP (procalcitonin:4,lacticidven:4) )No results found for this or any previous visit (from the past 240 hour(s)).   Scheduled Meds: . atorvastatin  40 mg Oral Daily  . budesonide (PULMICORT) nebulizer solution  0.5 mg Nebulization BID  . dextromethorphan-guaiFENesin  1 tablet  Oral BID  . gabapentin  600 mg Oral TID  . insulin aspart  0-20 Units Subcutaneous TID WC  . insulin aspart  0-5 Units Subcutaneous QHS  . insulin aspart  12 Units Subcutaneous TID WC  . insulin glargine  40 Units Subcutaneous Q2200  . ipratropium-albuterol  3 mL Nebulization Q6H  . pantoprazole  40 mg Oral BID AC  . polyethylene glycol  17 g Oral Daily  . predniSONE  60 mg Oral Q breakfast  . pregabalin  50 mg Oral BID  . senna  2 tablet Oral Daily  . sodium chloride flush  3 mL Intravenous Q12H  . torsemide  20 mg Oral Daily  . traZODone  50 mg Oral QHS   Continuous Infusions: . sodium chloride    . sodium chloride      Procedures/Studies: Dg Chest Port 1 View  Result Date: 09/12/2018 CLINICAL DATA:  History of COPD. Became short of breath. Shortness of breath for a few days worsening this morning. EXAM:  PORTABLE CHEST 1 VIEW COMPARISON:  07/03/2018 FINDINGS: Cardiac enlargement. Probable emphysematous changes in the lungs. No airspace disease or consolidation. No blunting of costophrenic angles. No pneumothorax. Mediastinal contours appear intact. IMPRESSION: Cardiac enlargement with probable emphysematous changes in the lungs. No evidence of active pulmonary disease. Electronically Signed   By: Burman Nieves M.D.   On: 09/12/2018 06:58    Catarina Hartshorn, DO  [facility] Pager [phone]  If 7PM-7AM, please contact night-coverage [email or web address] Password [email or web address] 09/14/2018, 3:46 PM   LOS: 1 day

## 2018-09-15 LAB — CBC
HCT: 42 % (ref 36.0–46.0)
HEMOGLOBIN: 12.6 g/dL (ref 12.0–15.0)
MCH: 31.4 pg (ref 26.0–34.0)
MCHC: 30 g/dL (ref 30.0–36.0)
MCV: 104.7 fL — ABNORMAL HIGH (ref 80.0–100.0)
Platelets: 100 10*3/uL — ABNORMAL LOW (ref 150–400)
RBC: 4.01 MIL/uL (ref 3.87–5.11)
RDW: 14.4 % (ref 11.5–15.5)
WBC: 6.8 10*3/uL (ref 4.0–10.5)
nRBC: 0 % (ref 0.0–0.2)

## 2018-09-15 LAB — GLUCOSE, CAPILLARY
GLUCOSE-CAPILLARY: 386 mg/dL — AB (ref 70–99)
Glucose-Capillary: 196 mg/dL — ABNORMAL HIGH (ref 70–99)
Glucose-Capillary: 211 mg/dL — ABNORMAL HIGH (ref 70–99)

## 2018-09-15 LAB — BASIC METABOLIC PANEL
ANION GAP: 9 (ref 5–15)
BUN: 13 mg/dL (ref 6–20)
CHLORIDE: 91 mmol/L — AB (ref 98–111)
CO2: 34 mmol/L — AB (ref 22–32)
Calcium: 8.4 mg/dL — ABNORMAL LOW (ref 8.9–10.3)
Creatinine, Ser: 0.52 mg/dL (ref 0.44–1.00)
GFR calc Af Amer: >60 mL/min (ref >60)
GLUCOSE: 174 mg/dL — AB (ref 70–99)
POTASSIUM: 3.5 mmol/L (ref 3.5–5.1)
Sodium: 134 mmol/L — ABNORMAL LOW (ref 135–145)

## 2018-09-15 MED ORDER — BASAGLAR KWIKPEN 100 UNIT/ML ~~LOC~~ SOPN: 25.0000 [IU] | PEN_INJECTOR | Freq: Every day | SUBCUTANEOUS | 11 refills | Status: AC

## 2018-09-15 MED ORDER — PREDNISONE 10 MG PO TABS: 60.0000 mg | ORAL_TABLET | Freq: Every day | ORAL | 0 refills | Status: DC

## 2018-09-15 MED ORDER — HUMALOG KWIKPEN 100 UNIT/ML ~~LOC~~ SOPN: 15.0000 [IU] | PEN_INJECTOR | Freq: Three times a day (TID) | SUBCUTANEOUS | 11 refills | Status: DC

## 2018-09-15 MED ORDER — INSULIN NPH ISOPHANE & REGULAR (70-30) 100 UNIT/ML ~~LOC~~ SUSP: 30.0000 [IU] | Freq: Two times a day (BID) | SUBCUTANEOUS | 0 refills | Status: DC

## 2018-09-15 NOTE — Discharge Summary (Signed)
Physician Discharge Summary  Mary [name] WJX:[id number] DOB: Mar 21, 1976 DOA: 09/12/2018  PCP: Corwin Levins, MD  Admit date: 09/12/2018 Discharge date: 09/15/2018  Admitted From: Home Disposition:  Home   Recommendations for Outpatient Follow-up:  1. Follow up with PCP in 1-2 weeks 2. Please obtain BMP/CBC in one week     Discharge Condition: Stable CODE STATUS:  FULL Diet recommendation: Heart Healthy / Carb Modified   Brief/Interim Summary: 42 year old female with history of COPD and chronic respiratory failure on 3 L at home, pulmonary hypertension, diastolic CHF, diabetes mellitus, and liver cirrhosis presenting with shortness of breath that began on the morning of 09/12/2018. The patient denied any fevers, chills, hemoptysis, vomiting, diarrhea. She attributes her shortness of breath to temporarily living in a motel while she transitions to moving to [facility]. The patient states that she continues to intermittently smoke.She states that, "I taken few dragsoccasionally." She states that otherwise she quit smoking 1 month prior to this admission. She denies any recent travels or sick contacts, but states that her husband continues to smoke. The patient had a previous history of alcohol abuse drinking vodka daily, but she states that she has not drank any alcohol in 4 months. The patient tried using her bronchodilators and nebulizers without relief. As result, she presented to the hospital for further evaluation. Chest x-ray showed cardiomegaly with emphysematous changes. The patient was started on IV steroids and bronchodilators with clinical improvement.  Discharge Diagnoses:  Acute on chronic respiratory failure with hypoxia -Secondary to COPD exacerbation -Presently on 5 L>>>3L with saturation 96-97% -Wean oxygen back to baseline 3 L for saturation greater 92% -Personally reviewed chest x-ray--no infiltrates or edema  COPD exacerbation -Started  Pulmicort -Continue bronchodilators -Wean Solu-Medrol>>>prednisone taper over 2 weeks -Hold Spiriva-->restart after d/c -Incentive spirometry -Continue Mucinex  Chronic diastolic CHF -02/01/2017 echo EF 65 to 70%, grade 1 DD, PASP 42, mild RV dilatation -Daily weights -Continue torsemide -Clinically compensated  Diabetes mellitus type 2,uncontrolled with hyperglycemia -In part due to steroids -Increaseed Lantus 40 units daily -add novolog 12 units with meals -Resistant NovoLog sliding scale -08/30/2018 hemoglobin A1c 6.5 -on day of d/c--pt states she lost/misplaced her insulin during her recent move and insurance stated its too soon to refill her Hospital doctor -Rx given for novolin 70/30 insulin x 1 month--30 units bid-->instrtucted pt to use this until she is able to get her basaglar and humalog refilled at which time she should stop 70/30  Diabeticneuropathy -Continue gabapentin, Lyrica  Hyperlipidemia -Continue statin -She states her her doctor sent this medication and for her but did not discuss her lipid results. Have gone over her lipid profile.  Chronic thrombocytopenia -continue to monitor CBC -due to liver cirrhosis  Hyponatremia -due to hyperglycemia, CHF and liver cirrhosis -stable/improving  Discharge Instructions   Allergies as of 09/15/2018      Reactions   Bee Venom Swelling   Pt states she swells up 3x normal size. If stung "2-3 times, my throat could close up"      Medication List    TAKE these medications   albuterol 108 (90 Base) MCG/ACT inhaler Commonly known as:  PROVENTIL HFA;VENTOLIN HFA USE 2 PUFFS EVERY 6 HOURS AS NEEDED FOR WHEEZING   atorvastatin 40 MG tablet Commonly known as:  LIPITOR Take 1 tablet (40 mg total) by mouth daily.   BASAGLAR KWIKPEN 100 UNIT/ML Sopn Inject 0.25 mLs (25 Units total) into the skin daily at 10 pm. E11.9  Restart after finished with 70/30  insulin What changed:  additional instructions     budesonide-formoterol 160-4.5 MCG/ACT inhaler Commonly known as:  SYMBICORT Inhale 2 puffs into the lungs daily.   gabapentin 300 MG capsule Commonly known as:  NEURONTIN TAKE 2 CAPSULES (600 MG TOTAL) BY MOUTH 3 (THREE) TIMES DAILY.   glucose blood test strip Use as instructed four times per day E11.9   HUMALOG KWIKPEN 100 UNIT/ML KiwkPen Generic drug:  insulin lispro Inject 0.15 mLs (15 Units total) into the skin 3 (three) times daily with meals. Restart after finished with 70/30 insulin What changed:    how much to take  additional instructions   insulin NPH-regular Human (70-30) 100 UNIT/ML injection Commonly known as:  NOVOLIN 70/30 Inject 30 Units into the skin 2 (two) times daily with a meal.   Insulin Pen Needle 32G X 4 MM Misc Use as directed four times per day daily E11.9   pantoprazole 40 MG tablet Commonly known as:  PROTONIX Take 1 tablet (40 mg total) by mouth 2 (two) times daily before a meal.   predniSONE 10 MG tablet Commonly known as:  DELTASONE Take 6 tablets (60 mg total) by mouth daily with breakfast. And decrease by one tablet every 2 days Start taking on:  09/16/2018   pregabalin 50 MG capsule Commonly known as:  LYRICA Take 1 capsule (50 mg total) by mouth 2 (two) times daily.   SPIRIVA RESPIMAT 1.25 MCG/ACT Aers Generic drug:  Tiotropium Bromide Monohydrate INHALE 2 PUFFS INTO THE LUNGS DAILY.   torsemide 20 MG tablet Commonly known as:  DEMADEX TAKE 1 TABLET BY MOUTH EVERY DAY   traMADol 50 MG tablet Commonly known as:  ULTRAM Take 1 tablet (50 mg total) by mouth every 6 (six) hours as needed.       Allergies  Allergen Reactions  . Bee Venom Swelling    Pt states she swells up 3x normal size. If stung "2-3 times, my throat could close up"    Consultations:  none   Procedures/Studies: Dg Chest Port 1 View  Result Date: 09/12/2018 CLINICAL DATA:  History of COPD. Became short of breath. Shortness of breath for a few days  worsening this morning. EXAM: PORTABLE CHEST 1 VIEW COMPARISON:  07/03/2018 FINDINGS: Cardiac enlargement. Probable emphysematous changes in the lungs. No airspace disease or consolidation. No blunting of costophrenic angles. No pneumothorax. Mediastinal contours appear intact. IMPRESSION: Cardiac enlargement with probable emphysematous changes in the lungs. No evidence of active pulmonary disease. Electronically Signed   By: Burman Nieves M.D.   On: 09/12/2018 06:58        Discharge Exam: Vitals:   09/15/18 0806 09/15/18 0808  BP:    Pulse:    Resp:    Temp:    SpO2: 98% 98%   Vitals:   09/15/18 0201 09/15/18 0537 09/15/18 0806 09/15/18 0808  BP:  103/62    Pulse:  73    Resp:  20    Temp:  (!) 97.5 F (36.4 C)    TempSrc:  Oral    SpO2: 97% 95% 98% 98%  Weight:      Height:        General: Pt is alert, awake, not in acute distress Cardiovascular: RRR, S1/S2 +, no rubs, no gallops Respiratory: bibasilar rales, no wheezeng Abdominal: Soft, NT, ND, bowel sounds + Extremities: trace LE edema, no cyanosis   The results of significant diagnostics from this hospitalization (including imaging, microbiology, ancillary and laboratory) are listed below for reference.  Significant Diagnostic Studies: Dg Chest Port 1 View  Result Date: 09/12/2018 CLINICAL DATA:  History of COPD. Became short of breath. Shortness of breath for a few days worsening this morning. EXAM: PORTABLE CHEST 1 VIEW COMPARISON:  07/03/2018 FINDINGS: Cardiac enlargement. Probable emphysematous changes in the lungs. No airspace disease or consolidation. No blunting of costophrenic angles. No pneumothorax. Mediastinal contours appear intact. IMPRESSION: Cardiac enlargement with probable emphysematous changes in the lungs. No evidence of active pulmonary disease. Electronically Signed   By: Burman Nieves M.D.   On: 09/12/2018 06:58     Microbiology: No results found for this or any previous visit (from the  past 240 hour(s)).   Labs: Basic Metabolic Panel: Recent Labs  Lab 09/12/18 0720 09/13/18 0552 09/13/18 2157 09/13/18 2353 09/15/18 0602  NA 136 132*  --  130* 134*  K 3.6 4.0  --  5.0 3.5  CL 91* 86*  --  88* 91*  CO2 36* 36*  --  32 34*  GLUCOSE 185* 341* 582* 581* 174*  BUN 5* 12  --  19 13  CREATININE 0.43* 0.57  --  0.77 0.52  CALCIUM 8.8* 8.6*  --  8.6* 8.4*  MG  --   --   --  2.3  --   PHOS  --   --   --  2.8  --    Liver Function Tests: No results for input(s): AST, ALT, ALKPHOS, BILITOT, PROT, ALBUMIN in the last 168 hours. No results for input(s): LIPASE, AMYLASE in the last 168 hours. No results for input(s): AMMONIA in the last 168 hours. CBC: Recent Labs  Lab 09/12/18 0720 09/13/18 0552  WBC 7.3 9.6  HGB 13.3 13.4  HCT 42.7 42.6  MCV 104.1* 102.9*  PLT 87* 100*   Cardiac Enzymes: No results for input(s): CKTOTAL, CKMB, CKMBINDEX, TROPONINI in the last 168 hours. BNP: Invalid input(s): POCBNP CBG: Recent Labs  Lab 09/14/18 1115 09/14/18 1624 09/14/18 2112 09/15/18 0730 09/15/18 1120  GLUCAP 420* 275* 455* 196* 211*    Time coordinating discharge:  36 minutes  Signed:  Catarina Hartshorn, DO [facility] Pager: [phone] 09/15/2018, 12:48 PM

## 2018-09-15 NOTE — Clinical Social Work Note (Signed)
LCSW advised patient that after speaking with assistant director, Wandra Mannan, there are no funds available to pay for the hotel room.   Patient verbalized understanding.     Delana Manganello, Juleen China, LCSW

## 2018-09-15 NOTE — Clinical Social Work Note (Signed)
Patient states that she is discharging today. She states that she and her husband's home was "sold out from under them" because they were on a month to month lease. She stated that they have been staying at the [facility] while they look for another place to live. She stated that her husband has spend all his funds in gas going to and from work in [employer], [place].  She stated that her husband is going to go in to his 401K however he cannot get the check until about Tuesday. She stated that they have found a [address] and once he gets his check from 401K he will pay the funds to move in. She inquired about funds the hospital had to pay for a few nights in the [facility] at a rate of $50 per night. Patient stated that she would camp out for the next few nights but she has to have electricity due to being on oxygen.   LCSW discussed with patient contacting DSS to inquire about available funding. LCSW left a message for Assistant Director, Wandra Mannan, in regards to available funds related to assisting patient.   Lineth Thielke, Juleen China, LCSW

## 2018-09-25 ENCOUNTER — Other Ambulatory Visit: Payer: Self-pay | Admitting: Internal Medicine

## 2018-09-26 ENCOUNTER — Other Ambulatory Visit: Payer: Self-pay | Admitting: Internal Medicine

## 2018-09-27 ENCOUNTER — Telehealth: Payer: Self-pay | Admitting: Internal Medicine

## 2018-09-27 MED ORDER — BUDESONIDE-FORMOTEROL FUMARATE 160-4.5 MCG/ACT IN AERO: 2.0000 | INHALATION_SPRAY | Freq: Every day | RESPIRATORY_TRACT | 2 refills | Status: DC

## 2018-09-27 MED ORDER — BUDESONIDE-FORMOTEROL FUMARATE 160-4.5 MCG/ACT IN AERO: 2.0000 | INHALATION_SPRAY | Freq: Every day | RESPIRATORY_TRACT | Status: DC

## 2018-09-27 MED ORDER — TIOTROPIUM BROMIDE MONOHYDRATE 1.25 MCG/ACT IN AERS: 2.0000 | INHALATION_SPRAY | Freq: Every day | RESPIRATORY_TRACT | 5 refills | Status: DC

## 2018-09-27 MED ORDER — ALBUTEROL SULFATE (2.5 MG/3ML) 0.083% IN NEBU: 2.5000 mg | INHALATION_SOLUTION | Freq: Four times a day (QID) | RESPIRATORY_TRACT | 2 refills | Status: DC | PRN

## 2018-09-27 MED ORDER — TIOTROPIUM BROMIDE MONOHYDRATE 1.25 MCG/ACT IN AERS: 2.0000 | INHALATION_SPRAY | Freq: Every day | RESPIRATORY_TRACT | 2 refills | Status: DC

## 2018-09-27 MED ORDER — ALBUTEROL SULFATE HFA 108 (90 BASE) MCG/ACT IN AERS: INHALATION_SPRAY | RESPIRATORY_TRACT | 2 refills | Status: DC

## 2018-09-27 MED ORDER — ALBUTEROL SULFATE HFA 108 (90 BASE) MCG/ACT IN AERS: INHALATION_SPRAY | RESPIRATORY_TRACT | 3 refills | Status: DC

## 2018-09-27 NOTE — Telephone Encounter (Signed)
Rx's sent in, pt notified and nothing further is needed.

## 2018-09-27 NOTE — Telephone Encounter (Signed)
Reviewed pt's chart and she needs an appt but she states she is unable to make an appt because she lost her house and staying with her mother in law in [place]. I noticed that she was in the hospital at [facility] for COPD exacerbation and I expressed to her the urgency of returning for a follow up appt. She states she will be there for a month and will make an appt as soon as they find a house. She is also requesting a refill for albuterol nebulizer medication and states MR did not prescribe these but she was using her neighbors medication and it really helped. She is requesting refills on her albuterol inhaler, Spiriva, and Symbicort. MR ok to send in until pt can return? She has not been seen in over a year.   Pharmacy, [facility] and [facility] her.

## 2018-09-27 NOTE — Telephone Encounter (Signed)
Yes ok to refill 

## 2018-10-09 DIAGNOSIS — R000 Tachycardia, unspecified: Secondary | ICD-10-CM | POA: Diagnosis not present

## 2018-10-09 DIAGNOSIS — I270 Primary pulmonary hypertension: Secondary | ICD-10-CM | POA: Diagnosis not present

## 2018-10-09 DIAGNOSIS — J449 Chronic obstructive pulmonary disease, unspecified: Secondary | ICD-10-CM | POA: Diagnosis not present

## 2018-10-09 DIAGNOSIS — R0789 Other chest pain: Secondary | ICD-10-CM | POA: Diagnosis not present

## 2018-10-09 DIAGNOSIS — I517 Cardiomegaly: Secondary | ICD-10-CM | POA: Diagnosis not present

## 2018-10-09 DIAGNOSIS — G4733 Obstructive sleep apnea (adult) (pediatric): Secondary | ICD-10-CM | POA: Diagnosis not present

## 2018-10-09 DIAGNOSIS — J441 Chronic obstructive pulmonary disease with (acute) exacerbation: Secondary | ICD-10-CM | POA: Diagnosis not present

## 2018-10-09 DIAGNOSIS — R0602 Shortness of breath: Secondary | ICD-10-CM | POA: Diagnosis not present

## 2018-10-11 DIAGNOSIS — J449 Chronic obstructive pulmonary disease, unspecified: Secondary | ICD-10-CM | POA: Diagnosis not present

## 2018-10-11 DIAGNOSIS — I270 Primary pulmonary hypertension: Secondary | ICD-10-CM | POA: Diagnosis not present

## 2018-10-11 DIAGNOSIS — G4733 Obstructive sleep apnea (adult) (pediatric): Secondary | ICD-10-CM | POA: Diagnosis not present

## 2018-10-15 ENCOUNTER — Other Ambulatory Visit: Payer: Self-pay | Admitting: Internal Medicine

## 2018-10-15 DIAGNOSIS — E1142 Type 2 diabetes mellitus with diabetic polyneuropathy: Secondary | ICD-10-CM

## 2018-11-08 DIAGNOSIS — J449 Chronic obstructive pulmonary disease, unspecified: Secondary | ICD-10-CM | POA: Diagnosis not present

## 2018-11-08 DIAGNOSIS — G4733 Obstructive sleep apnea (adult) (pediatric): Secondary | ICD-10-CM | POA: Diagnosis not present

## 2018-11-08 DIAGNOSIS — I270 Primary pulmonary hypertension: Secondary | ICD-10-CM | POA: Diagnosis not present

## 2018-11-10 DIAGNOSIS — G4733 Obstructive sleep apnea (adult) (pediatric): Secondary | ICD-10-CM | POA: Diagnosis not present

## 2018-11-10 DIAGNOSIS — J449 Chronic obstructive pulmonary disease, unspecified: Secondary | ICD-10-CM | POA: Diagnosis not present

## 2018-11-10 DIAGNOSIS — I270 Primary pulmonary hypertension: Secondary | ICD-10-CM | POA: Diagnosis not present

## 2018-12-08 ENCOUNTER — Other Ambulatory Visit: Payer: Self-pay

## 2018-12-08 ENCOUNTER — Emergency Department (HOSPITAL_COMMUNITY): Payer: 59

## 2018-12-08 ENCOUNTER — Encounter (HOSPITAL_COMMUNITY): Payer: Self-pay | Admitting: Emergency Medicine

## 2018-12-08 ENCOUNTER — Emergency Department (HOSPITAL_COMMUNITY)
Admission: EM | Admit: 2018-12-08 | Discharge: 2018-12-08 | Disposition: A | Discharge status: Home / Self Care | Payer: 59 | Attending: Emergency Medicine | Admitting: Emergency Medicine

## 2018-12-08 DIAGNOSIS — Z794 Long term (current) use of insulin: Secondary | ICD-10-CM | POA: Insufficient documentation

## 2018-12-08 DIAGNOSIS — E1140 Type 2 diabetes mellitus with diabetic neuropathy, unspecified: Secondary | ICD-10-CM | POA: Diagnosis not present

## 2018-12-08 DIAGNOSIS — E1165 Type 2 diabetes mellitus with hyperglycemia: Secondary | ICD-10-CM | POA: Insufficient documentation

## 2018-12-08 DIAGNOSIS — Z79899 Other long term (current) drug therapy: Secondary | ICD-10-CM | POA: Diagnosis not present

## 2018-12-08 DIAGNOSIS — R42 Dizziness and giddiness: Secondary | ICD-10-CM | POA: Diagnosis not present

## 2018-12-08 DIAGNOSIS — E876 Hypokalemia: Secondary | ICD-10-CM

## 2018-12-08 DIAGNOSIS — F17210 Nicotine dependence, cigarettes, uncomplicated: Secondary | ICD-10-CM | POA: Insufficient documentation

## 2018-12-08 DIAGNOSIS — I5032 Chronic diastolic (congestive) heart failure: Secondary | ICD-10-CM | POA: Insufficient documentation

## 2018-12-08 DIAGNOSIS — R0602 Shortness of breath: Secondary | ICD-10-CM | POA: Diagnosis not present

## 2018-12-08 DIAGNOSIS — R82998 Other abnormal findings in urine: Secondary | ICD-10-CM | POA: Insufficient documentation

## 2018-12-08 DIAGNOSIS — R739 Hyperglycemia, unspecified: Secondary | ICD-10-CM

## 2018-12-08 DIAGNOSIS — J449 Chronic obstructive pulmonary disease, unspecified: Secondary | ICD-10-CM | POA: Diagnosis not present

## 2018-12-08 DIAGNOSIS — R110 Nausea: Secondary | ICD-10-CM | POA: Diagnosis not present

## 2018-12-08 DIAGNOSIS — R062 Wheezing: Secondary | ICD-10-CM | POA: Diagnosis not present

## 2018-12-08 LAB — URINALYSIS, ROUTINE W REFLEX MICROSCOPIC
Hgb urine dipstick: NEGATIVE
Ketones, ur: NEGATIVE mg/dL
Leukocytes, UA: NEGATIVE
Nitrite: NEGATIVE
PROTEIN: NEGATIVE mg/dL
SPECIFIC GRAVITY, URINE: 1.028 (ref 1.005–1.030)
pH: 7 (ref 5.0–8.0)

## 2018-12-08 LAB — BASIC METABOLIC PANEL
Anion gap: 11 (ref 5–15)
BUN: 6 mg/dL (ref 6–20)
CHLORIDE: 93 mmol/L — AB (ref 98–111)
CO2: 32 mmol/L (ref 22–32)
CREATININE: 0.48 mg/dL (ref 0.44–1.00)
Calcium: 9.5 mg/dL (ref 8.9–10.3)
GFR calc Af Amer: >60 mL/min (ref >60)
GFR calc non Af Amer: >60 mL/min (ref >60)
Glucose, Bld: 240 mg/dL — ABNORMAL HIGH (ref 70–99)
Potassium: 3.2 mmol/L — ABNORMAL LOW (ref 3.5–5.1)
SODIUM: 136 mmol/L (ref 135–145)

## 2018-12-08 LAB — BLOOD GAS, VENOUS
Acid-Base Excess: 9.4 mmol/L — ABNORMAL HIGH (ref 0.0–2.0)
BICARBONATE: 31.1 mmol/L — AB (ref 20.0–28.0)
O2 Content: 3 L/min
O2 Saturation: 74.3 %
PATIENT TEMPERATURE: 37
PH VEN: 7.383 (ref 7.250–7.430)
pCO2, Ven: 59.7 mmHg (ref 44.0–60.0)
pO2, Ven: 41.1 mmHg (ref 32.0–45.0)

## 2018-12-08 MED ORDER — ONDANSETRON HCL 4 MG/2ML IJ SOLN
4.0000 mg | Freq: Once | INTRAMUSCULAR | Status: AC
  Administered 2018-12-08: 4 mg via INTRAVENOUS
  Filled 2018-12-08: qty 2

## 2018-12-08 MED ORDER — INSULIN NPH ISOPHANE & REGULAR (70-30) 100 UNIT/ML ~~LOC~~ SUSP: 5.0000 [IU] | Freq: Two times a day (BID) | SUBCUTANEOUS | 0 refills | Status: DC

## 2018-12-08 MED ORDER — POTASSIUM CHLORIDE CRYS ER 20 MEQ PO TBCR: 40.0000 meq | EXTENDED_RELEASE_TABLET | Freq: Every day | ORAL | 0 refills | Status: DC

## 2018-12-08 MED ORDER — SODIUM CHLORIDE 0.9 % IV BOLUS
500.0000 mL | Freq: Once | INTRAVENOUS | Status: AC
  Administered 2018-12-08: 500 mL via INTRAVENOUS

## 2018-12-08 MED ORDER — GLUCOSE BLOOD VI STRP: ORAL_STRIP | 12 refills | Status: AC

## 2018-12-08 MED ORDER — INSULIN ASPART 100 UNIT/ML ~~LOC~~ SOLN
5.0000 [IU] | Freq: Once | SUBCUTANEOUS | Status: AC
  Administered 2018-12-08: 5 [IU] via SUBCUTANEOUS
  Filled 2018-12-08: qty 1

## 2018-12-08 MED ORDER — POTASSIUM CHLORIDE CRYS ER 20 MEQ PO TBCR
40.0000 meq | EXTENDED_RELEASE_TABLET | Freq: Once | ORAL | Status: AC
  Administered 2018-12-08: 40 meq via ORAL
  Filled 2018-12-08: qty 2

## 2018-12-08 MED ORDER — HUMALOG KWIKPEN 100 UNIT/ML ~~LOC~~ SOPN: 15.0000 [IU] | PEN_INJECTOR | Freq: Three times a day (TID) | SUBCUTANEOUS | 11 refills | Status: AC

## 2018-12-08 NOTE — ED Notes (Signed)
ED Provider at bedside. 

## 2018-12-08 NOTE — ED Notes (Signed)
Pt to BR at this time with portable 2L [place], pt ambulates with steady gait, no assistance needed, no difficulty reported

## 2018-12-08 NOTE — ED Provider Notes (Signed)
Medical screening examination/treatment/procedure(s) were conducted as a shared visit with non-physician practitioner(s) and myself.  I personally evaluated the patient during the encounter.  Clinical Impression:   Final diagnoses:  Hyperglycemia  Hypokalemia  Dizziness   The patient is a 43 year old female, she has a known history of COPD and chronic respiratory failure on 3 L of continuous oxygen at home.  She has had several days of what she describes as nausea as well as some vertigo when she gets up to walk.  She denies any headache to me and denies any other symptoms.  On exam the patient is able to speak in full sentences, she is having some expiratory wheezing but not using accessory muscles.  She does have a disconjugate gaze at baseline.  She has a soft nontender abdomen, she appears slightly dehydrated in the mucous membranes.  She reports that she does not use her insulin all the time, she only uses it intermittently when she feels like she needs it which usually surrounds getting steroids for COPD exacerbations.  She last had steroids about 3 weeks ago.  She finished her medications, took insulin but has not had it in several weeks as she ran out.  Check a VBG to rule out hypercapnia, metabolic panel to evaluate for anion gap acidosis, chest x-ray to look for signs of infection as well as a urinalysis.  Patient getting IV fluids, she is nauseated but tolerating oral fluids.  Reevaluate when testing done.      Eber Hong, MD 12/11/18 [phone]

## 2018-12-08 NOTE — ED Provider Notes (Signed)
[facility] EMERGENCY DEPARTMENT Provider Note   CSN: [id number] Arrival date & time: 12/08/18  1914     History   Chief Complaint Chief Complaint  Patient presents with  . Dizziness    HPI Mary [name] is a 43 y.o. female with a past medical history significant for COPD, chronic respiratory failure using 3L continuous O2, diabetes, CHF presents to the emergency department with increased SOB, nausea, and dizziness x 3 days. She did not want to come to the ED, but her husband was concerned about her SOB. She says that he has commented on her increasing SOB with activty and is concerned she is retaining too much CO2. She reports feeling dizzy with movement that resolves at rest. The nausea has been episodic and she cannot tell if there are alleviating or aggravating factors.   She is out of her insulin. She moved 2 months ago and left her insulin and glucometer behind. She does not check her sugar daily because it is "usually fine" unless she has been prescribed steroids. She reports having steroids approximately 2 weeks ago. She is unable to get her insulin refilled for at least another week.   Denies fever, cough, chest pain, abdominal pain, vomiting, diarrhea, syncope, weakness.     Past Medical History:  Diagnosis Date  . Asthma   . Axillary adenopathy    right axillary adenopathy noted on CT chest (03/06/2012)  . Chronic right-sided CHF (congestive heart failure) (HCC) 03/23/2012   with cor pulmonale. Last RHC 03/2012  . COPD (chronic obstructive pulmonary disease) (HCC)    PFTS 03/08/12: fev1 0.58L/1%, FVC 1.18/33%, Ratop 49 and c/w ssevere obstruction. 21% BD response on FVC, RV 219%, DLCO 11/54%  . Diabetes mellitus without complication (HCC)   . DVT of upper extremity (deep vein thrombosis) [facility](HCC) April 2013   right subclavian // Unclear precipitating cause - possibly significant right heart failure, with vascular stasis potentially predisposing to clotting.    .  Exertional shortness of breath   . Hepatic cirrhosis (HCC)    Questionable history of - Noted on CT abdomen (03/2012) - thought to be due to vascular congestion from right heart failure +/- patient's history of alcohol abuse  . History of alcoholism (HCC)   . History of chronic bronchitis   . Irregular menses   . Migraine    "~ 1/yr" (05/03/2013)  . Moderate to severe pulmonary hypertension [facility](HCC) April 2013   Cardiac cath on 03/06/12 - 1. Elevated pulmonary artery pressures, right sided filling pressures.,  2. PA: 64/45 (mean 53)    . On home oxygen therapy    "2-3 L 24/7" (05/03/2013)  . OSA (obstructive sleep apnea)    wears noctural BiPAP (05/03/2013)  . Periodontal disease   . Pneumonia ~ 1985; 01/2011  . Pulmonary embolus [facility](HCC) April 2013   Precipitating cause unclear. Was treated with coumadin from April-June 2013.  . Pulmonary hypertension [facility](HCC)     Patient Active Problem List   Diagnosis Date Noted  . Chronic pain 08/30/2018  . COPD exacerbation (HCC) 07/03/2018  . COPD with exacerbation (HCC) 05/03/2018  . COPD with acute exacerbation (HCC) 01/11/2018  . Left flank pain 11/01/2017  . Positive D dimer   . Leukocytosis 04/29/2017  . Acute exacerbation of chronic obstructive pulmonary disease (COPD) (HCC) 04/24/2017  . 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 Left ankle sprain 04/21/2017  . Fungemia 02/13/2017  . COPD (chronic obstructive pulmonary disease) (HCC) 02/13/2017  . Chronic respiratory failure with  hypoxia and hypercapnia (HCC) 02/13/2017  . Thrombocytopenia (HCC) 02/01/2017  . Alcohol abuse 01/31/2017  . Acute on chronic respiratory failure with hypoxia and hypercapnia (HCC)   . Tracheomalacia   . Palliative care by specialist   . Anxiety state   . Advance directive declined by patient   . Goals of care, counseling/discussion   . Chronic hoarseness 07/27/2016  . Diabetic neuropathy (HCC) 04/30/2016  . Diabetes mellitus type 2 in obese (HCC) 04/10/2016  .  Chronic diastolic heart failure (HCC) 01/25/2016  . PAH (pulmonary artery hypertension) (HCC)   . Constipation 11/18/2015  . Morbid obesity (HCC) 11/18/2015  . Lung nodule 01/30/2015  . Pulmonary mass 01/13/2015  . Gastroparesis   . Esophageal reflux   . Obstipation   . Hypokalemia 02/28/2013  . Financial difficulties 02/28/2013  . Obesity hypoventilation syndrome (HCC) 02/28/2013  . Myalgia 02/28/2013  . Anovulation 06/30/2012  . Preventative health care 05/05/2012  . Irregular menstrual cycle 04/27/2012  . Chronic right-sided CHF (congestive heart failure) (HCC) 03/23/2012  . Pulmonary hypertension (HCC) 03/23/2012  . Cor pulmonale (HCC) 03/21/2012  . OSA on BiPAP 03/19/2012  . Axillary lymphadenopathy 03/06/2012  . Congenital heart disease 01/28/2012  . Dental caries 01/28/2012    Past Surgical History:  Procedure Laterality Date  . APPENDECTOMY  05/03/2013  . CARDIAC CATHETERIZATION  03/2012  . CARDIAC CATHETERIZATION N/A 01/08/2016   Procedure: Right Heart Cath;  Surgeon: Dolores Patty, MD;  Location: [facility] INVASIVE CV LAB;  Service: Cardiovascular;  Laterality: N/A;  . CARDIAC SURGERY  1977/08/27   "my heart was backwards" (05/03/2013)  . LAPAROSCOPIC APPENDECTOMY N/A 05/03/2013   Procedure: APPENDECTOMY LAPAROSCOPIC;  Surgeon: Cherylynn Ridges, MD;  Location: [facility] OR;  Service: General;  Laterality: N/A;  . LUNG SURGERY  Jan 28, 1977  . RIGHT HEART CATHETERIZATION N/A 03/07/2012   Procedure: RIGHT HEART CATH;  Surgeon: Kathleene Hazel, MD;  Location: [facility] CATH LAB;  Service: Cardiovascular;  Laterality: N/A;     OB History   No obstetric history on file.      Home Medications    Prior to Admission medications   Medication Sig Start Date End Date Taking? Authorizing Provider  albuterol (PROVENTIL HFA;VENTOLIN HFA) 108 (90 Base) MCG/ACT inhaler USE 2 PUFFS EVERY 6 HOURS AS NEEDED FOR WHEEZING 09/27/18   Kalman Shan, MD  albuterol (PROVENTIL) (2.5 MG/3ML) 0.083%  nebulizer solution Take 3 mLs (2.5 mg total) by nebulization every 6 (six) hours as needed for wheezing or shortness of breath. 09/27/18   Kalman Shan, MD  atorvastatin (LIPITOR) 40 MG tablet Take 1 tablet (40 mg total) by mouth daily. Patient not taking: Reported on 09/12/2018 08/30/18   Corwin Levins, MD  budesonide-formoterol [facility]) 160-4.5 MCG/ACT inhaler Inhale 2 puffs into the lungs daily. 09/27/18   Kalman Shan, MD  gabapentin (NEURONTIN) 300 MG capsule TAKE 2 CAPSULES (600 MG TOTAL) BY MOUTH 3 (THREE) TIMES DAILY. 10/16/18   Corwin Levins, MD  glucose blood (ONE TOUCH ULTRA TEST) test strip Use as instructed four times per day E11.9 05/24/18   Corwin Levins, MD  HUMALOG KWIKPEN 100 UNIT/ML KiwkPen Inject 0.15 mLs (15 Units total) into the skin 3 (three) times daily with meals. Restart after finished with 70/30 insulin 09/15/18   Tat, Onalee Hua, MD  Insulin Glargine (BASAGLAR KWIKPEN) 100 UNIT/ML SOPN Inject 0.25 mLs (25 Units total) into the skin daily at 10 pm. E11.9  Restart after finished with 70/30 insulin 09/15/18   Catarina Hartshorn, MD  insulin NPH-regular Human (NOVOLIN 70/30) (70-30) 100 UNIT/ML injection Inject 30 Units into the skin 2 (two) times daily with a meal. 09/15/18   Tat, Onalee Huaavid, MD  Insulin Pen Needle (BD PEN NEEDLE NANO U/F) 32G X 4 MM MISC Use as directed four times per day daily E11.9 05/24/18   Corwin LevinsJohn, [name], MD  pantoprazole (PROTONIX) 40 MG tablet Take 1 tablet (40 mg total) by mouth 2 (two) times daily before a meal. 07/22/16   Jeralyn BennettZamora, Ezequiel, MD  predniSONE (DELTASONE) 10 MG tablet Take 6 tablets (60 mg total) by mouth daily with breakfast. And decrease by one tablet every 2 days 09/16/18   Tat, Onalee Huaavid, MD  pregabalin (LYRICA) 50 MG capsule Take 1 capsule (50 mg total) by mouth 2 (two) times daily. 10/31/17   Plotnikov, Georgina QuintAleksei V, MD  Tiotropium Bromide Monohydrate (SPIRIVA RESPIMAT) 1.25 MCG/ACT AERS Inhale 2 puffs into the lungs daily. 09/27/18   Kalman Shanamaswamy,  Murali, MD  torsemide (DEMADEX) 20 MG tablet TAKE 1 TABLET BY MOUTH EVERY DAY 08/24/18   Corwin LevinsJohn, [name], MD  traMADol (ULTRAM) 50 MG tablet Take 1 tablet (50 mg total) by mouth every 6 (six) hours as needed. 08/30/18   Corwin LevinsJohn, [name], MD    Family History Family History  Problem Relation Age of Onset  . Hypertension Father   . Heart disease Father        CHF; died age 42   . Alcohol abuse Father   . Other Brother        died age 42 y.o overdose    Social History Social History   Tobacco Use  . Smoking status: Current Some Day Smoker    Packs/day: 0.25    Years: 25.00    Pack years: 6.25    Types: Cigarettes    Last attempt to quit: 01/08/2018    Years since quitting: 0.9  . Smokeless tobacco: Never Used  . Tobacco comment: a couple drags of one cigarettes  Substance Use Topics  . Alcohol use: Not Currently  . Drug use: No     Allergies   Bee venom   Review of Systems Review of Systems  Constitutional: Negative for chills and fever.  HENT: Negative for congestion, sinus pressure and sore throat.   Eyes: Negative for pain.  Respiratory: Positive for shortness of breath. Negative for chest tightness.   Cardiovascular: Negative for chest pain, palpitations and leg swelling.  Gastrointestinal: Positive for nausea. Negative for abdominal pain, diarrhea and vomiting.  Genitourinary: Negative for difficulty urinating and hematuria.  Musculoskeletal: Negative for back pain and neck pain.  Skin: Negative for rash and wound.  Neurological: Positive for dizziness. Negative for syncope and headaches.     Physical Exam Updated Vital Signs BP 117/74 (BP Location: Left Arm)   Pulse 89   Temp 98.3 F (36.8 C) (Oral)   Resp 17   Ht 5' 2.5" (1.588 m)   Wt 88.5 kg   LMP 11/26/2018   SpO2 93%   BMI 35.10 kg/m   Physical Exam Vitals signs and nursing note reviewed.  Constitutional:      Appearance: She is not toxic-appearing.     Comments: Pt is chronically ill-appearing,  in no acute distress. Wearing nasal cannula 3L. Pt is able to speak in full sentences. No nasal flaring, no accessory muscle use, no pursed lip breathing.  HENT:     Head: Normocephalic and atraumatic.     Nose: Nose normal.     Mouth/Throat:  Mouth: Mucous membranes are dry.     Pharynx: Oropharynx is clear.  Eyes:     Conjunctiva/sclera: Conjunctivae normal.  Neck:     Musculoskeletal: Normal range of motion.  Cardiovascular:     Rate and Rhythm: Normal rate and regular rhythm.     Pulses: Normal pulses.     Heart sounds: Normal heart sounds.  Pulmonary:     Effort: Pulmonary effort is normal.     Breath sounds: Examination of the right-upper field reveals wheezing. Examination of the left-upper field reveals wheezing. Wheezing present.  Abdominal:     General: Bowel sounds are normal. There is no distension.     Tenderness: There is no abdominal tenderness. There is no guarding or rebound.  Musculoskeletal: Normal range of motion.     Right lower leg: No edema.     Left lower leg: No edema.  Skin:    General: Skin is warm and dry.     Capillary Refill: Capillary refill takes less than 2 seconds.  Neurological:     Mental Status: She is alert. Mental status is at baseline.     Motor: No weakness.  Psychiatric:        Mood and Affect: Mood normal.        Behavior: Behavior normal.        Judgment: Judgment normal.      ED Treatments / Results  Labs (all labs ordered are listed, but only abnormal results are displayed) Labs Reviewed - No data to display  EKG None  Radiology No results found.  Procedures Procedures (including critical care time)  Medications Ordered in ED Medications - No data to display   Initial Impression / Assessment and Plan / ED Course  I have reviewed the triage vital signs and the nursing notes.  Pertinent labs & imaging results that were available during my care of the patient were reviewed by me and considered in my medical  decision making (see chart for details).  Clinical Course as of Dec 08 2105  Caleen Essex Dec 08, 2018  2104 Pt's labs are not suggestive of DKA as she does not have an elevated anion gap. Her renal function is normal. VBG with a Co2 of 59.7, which despite her chronic hypercapnia is lower than prior Co2. Pt is tolerating PO fluids well   [KA]    Clinical Course User Index [KA] Stewart Sasaki E, PA-C   Portable chest xray shows chronic interstitial markings without evidence of acute disease. UA without signs of infection. Her BMP shows potassium of 3.2, and was replaced orally with 40 mEq during this visit. After replacing the potassium she was given 5 units sq insulin for her hyperglycemia. Pt has history of recurrent nausea and has taken Zofran in the past. Despite tolerating PO fluids and applesauce pt reports continued nausea, so Zofran was given.   Because pt has been out of her insulin a prescription for glucometer and insulin were given at discharge. Pt has used the [employer] before and has the chart at home for dosing. She is requesting this specific one at discharge because she knows her insurance will cover it.  Pt has a pcp but is looking for one closer to her home so resource sheet given. Pt recommended close follow up with her pcp and to check her glucose at home.   She does not have any further questions and agrees with the plan. Minutes before discharge I was in the room discussing the plan with  pt and her O2 stats were 93%.  The patient was discussed with and seen by Dr. Hyacinth Meeker who agrees with the treatment plan.    Final Clinical Impressions(s) / ED Diagnoses   Final diagnoses:  Hyperglycemia  Hypokalemia  Dizziness    ED Discharge Orders         Ordered    For home use only DME Glucometer    Comments:  One touch Ultra Glucometer   12/08/18 2214    glucose blood (ONE TOUCH ULTRA TEST) test strip     12/08/18 2214    potassium chloride SA (K-DUR,KLOR-CON) 20 MEQ  tablet  Daily,   Status:  Discontinued     12/08/18 2215    insulin NPH-regular Human (NOVOLIN 70/30) (70-30) 100 UNIT/ML injection  2 times daily with meals,   Status:  Discontinued     12/08/18 2215    HUMALOG KWIKPEN 100 UNIT/ML KwikPen  3 times daily with meals     12/08/18 2228    potassium chloride SA (K-DUR,KLOR-CON) 20 MEQ tablet  Daily     12/08/18 2232           Sherene Sires, PA-C 12/09/18 1200    Eber Hong, MD 12/11/18 1742

## 2018-12-08 NOTE — ED Triage Notes (Signed)
Pt here [facility] c/o dizziness and nausea x 3 days with some SOB, pt has hx of COPD and is on 3L O2 continuous at home

## 2018-12-08 NOTE — Discharge Instructions (Addendum)
Your fatigue could be a result of multiple factors. It is important to get your sugar under control to decrease that feeling. I have provided scripts for a glucometer, insulin, and an oral potassium replacement you should take daily.  It is important you follow up with your primary care doctor in 2-5 days. I have also provided a Resource sheet for primary care providers in this area.  You have been seen today for dizziness and elevated blood sugar. Please read and follow all provided instructions.   1. Medications: Take potassium replacement daily, use glucometer to check sugar daily, continue home medications 2. Treatment: rest, drink plenty of fluids 3. Follow Up: Please follow up with your primary doctor in 2-5 days for discussion of your diagnoses and further evaluation after today's visit; if you do not have a primary care doctor use the resource guide provided to find one; Please return to the ER for any new or worsening symptoms. Please obtain all of your results from medical records or have your doctors office obtain the results - share them with your doctor - you should be seen at your doctors office. Call today to arrange your follow up.   Take medications as prescribed. Please review all of the medicines and only take them if you do not have an allergy to them. Return to the emergency room for worsening condition or new concerning symptoms. Follow up with your regular doctor. If you don't have a regular doctor use one of the numbers below to establish a primary care doctor.   It is also a possibility that you have an allergic reaction to any of the medicines that you have been prescribed - Everybody reacts differently to medications and while MOST people have no trouble with most medicines, you may have a reaction such as nausea, vomiting, rash, swelling, shortness of breath. If this is the case, please stop taking the medicine immediately and contact your physician.  ?  You should return to  the ER if you develop severe or worsening symptoms.

## 2018-12-09 DIAGNOSIS — J449 Chronic obstructive pulmonary disease, unspecified: Secondary | ICD-10-CM | POA: Diagnosis not present

## 2018-12-09 DIAGNOSIS — G4733 Obstructive sleep apnea (adult) (pediatric): Secondary | ICD-10-CM | POA: Diagnosis not present

## 2018-12-09 DIAGNOSIS — I270 Primary pulmonary hypertension: Secondary | ICD-10-CM | POA: Diagnosis not present

## 2018-12-10 LAB — URINE CULTURE: Culture: NO GROWTH

## 2018-12-11 DIAGNOSIS — J449 Chronic obstructive pulmonary disease, unspecified: Secondary | ICD-10-CM | POA: Diagnosis not present

## 2018-12-11 DIAGNOSIS — I270 Primary pulmonary hypertension: Secondary | ICD-10-CM | POA: Diagnosis not present

## 2018-12-11 DIAGNOSIS — G4733 Obstructive sleep apnea (adult) (pediatric): Secondary | ICD-10-CM | POA: Diagnosis not present

## 2018-12-12 ENCOUNTER — Other Ambulatory Visit: Payer: Self-pay | Admitting: Internal Medicine

## 2018-12-14 ENCOUNTER — Telehealth: Payer: Self-pay | Admitting: Internal Medicine

## 2018-12-14 ENCOUNTER — Ambulatory Visit: Payer: 59 | Admitting: Adult Health

## 2018-12-14 MED ORDER — ALBUTEROL SULFATE (2.5 MG/3ML) 0.083% IN NEBU: 2.5000 mg | INHALATION_SOLUTION | Freq: Four times a day (QID) | RESPIRATORY_TRACT | 0 refills | Status: DC | PRN

## 2018-12-14 NOTE — Telephone Encounter (Signed)
Called and spoke to pt, who is requesting refill on albuterol neb solution. Pt last seen 05/06/17 and instructed to f/u in 1 year. Pt has pending OV with MR on 01/04/19. One month supply of albuterol neb solution has been sent to preferred pharmacy, to get pt by until her OV.  Nothing further is needed.

## 2018-12-26 ENCOUNTER — Emergency Department (HOSPITAL_COMMUNITY): Payer: 59

## 2018-12-26 ENCOUNTER — Encounter (HOSPITAL_COMMUNITY): Payer: Self-pay | Admitting: Emergency Medicine

## 2018-12-26 ENCOUNTER — Emergency Department (HOSPITAL_COMMUNITY)
Admission: EM | Admit: 2018-12-26 | Discharge: 2018-12-26 | Disposition: A | Discharge status: Home / Self Care | Payer: 59 | Source: Home / Self Care | Attending: Emergency Medicine | Admitting: Emergency Medicine

## 2018-12-26 ENCOUNTER — Other Ambulatory Visit: Payer: Self-pay

## 2018-12-26 DIAGNOSIS — J40 Bronchitis, not specified as acute or chronic: Secondary | ICD-10-CM

## 2018-12-26 DIAGNOSIS — E119 Type 2 diabetes mellitus without complications: Secondary | ICD-10-CM | POA: Insufficient documentation

## 2018-12-26 DIAGNOSIS — J101 Influenza due to other identified influenza virus with other respiratory manifestations: Secondary | ICD-10-CM | POA: Diagnosis not present

## 2018-12-26 DIAGNOSIS — F17210 Nicotine dependence, cigarettes, uncomplicated: Secondary | ICD-10-CM | POA: Insufficient documentation

## 2018-12-26 DIAGNOSIS — J449 Chronic obstructive pulmonary disease, unspecified: Secondary | ICD-10-CM

## 2018-12-26 DIAGNOSIS — R05 Cough: Secondary | ICD-10-CM | POA: Diagnosis not present

## 2018-12-26 DIAGNOSIS — I509 Heart failure, unspecified: Secondary | ICD-10-CM

## 2018-12-26 DIAGNOSIS — Z794 Long term (current) use of insulin: Secondary | ICD-10-CM | POA: Insufficient documentation

## 2018-12-26 DIAGNOSIS — R0902 Hypoxemia: Secondary | ICD-10-CM | POA: Diagnosis not present

## 2018-12-26 DIAGNOSIS — Z79899 Other long term (current) drug therapy: Secondary | ICD-10-CM | POA: Insufficient documentation

## 2018-12-26 LAB — CBC WITH DIFFERENTIAL/PLATELET
ABS IMMATURE GRANULOCYTES: 0.02 10*3/uL (ref 0.00–0.07)
Basophils Absolute: 0 10*3/uL (ref 0.0–0.1)
Basophils Relative: 0 %
Eosinophils Absolute: 0.2 10*3/uL (ref 0.0–0.5)
Eosinophils Relative: 2 %
HCT: 45.7 % (ref 36.0–46.0)
HEMOGLOBIN: 14.2 g/dL (ref 12.0–15.0)
Immature Granulocytes: 0 %
LYMPHS PCT: 15 %
Lymphs Abs: 1 10*3/uL (ref 0.7–4.0)
MCH: 31.6 pg (ref 26.0–34.0)
MCHC: 31.1 g/dL (ref 30.0–36.0)
MCV: 101.8 fL — ABNORMAL HIGH (ref 80.0–100.0)
Monocytes Absolute: 0.7 10*3/uL (ref 0.1–1.0)
Monocytes Relative: 10 %
NEUTROS ABS: 4.7 10*3/uL (ref 1.7–7.7)
Neutrophils Relative %: 73 %
Platelets: 116 10*3/uL — ABNORMAL LOW (ref 150–400)
RBC: 4.49 MIL/uL (ref 3.87–5.11)
RDW: 14.1 % (ref 11.5–15.5)
WBC: 6.5 10*3/uL (ref 4.0–10.5)
nRBC: 0 % (ref 0.0–0.2)

## 2018-12-26 LAB — BASIC METABOLIC PANEL
Anion gap: 11 (ref 5–15)
BUN: 6 mg/dL (ref 6–20)
CHLORIDE: 92 mmol/L — AB (ref 98–111)
CO2: 32 mmol/L (ref 22–32)
Calcium: 9.3 mg/dL (ref 8.9–10.3)
Creatinine, Ser: 0.46 mg/dL (ref 0.44–1.00)
GFR calc Af Amer: >60 mL/min (ref >60)
GFR calc non Af Amer: >60 mL/min (ref >60)
GLUCOSE: 128 mg/dL — AB (ref 70–99)
POTASSIUM: 3.6 mmol/L (ref 3.5–5.1)
SODIUM: 135 mmol/L (ref 135–145)

## 2018-12-26 MED ORDER — PREDNISONE 20 MG PO TABS: 40.0000 mg | ORAL_TABLET | Freq: Every day | ORAL | 0 refills | Status: DC

## 2018-12-26 MED ORDER — BENZONATATE 200 MG PO CAPS: 200.0000 mg | ORAL_CAPSULE | Freq: Three times a day (TID) | ORAL | 0 refills | Status: DC | PRN

## 2018-12-26 MED ORDER — IPRATROPIUM-ALBUTEROL 0.5-2.5 (3) MG/3ML IN SOLN: 3.0000 mL | RESPIRATORY_TRACT | 0 refills | Status: DC | PRN

## 2018-12-26 MED ORDER — AZITHROMYCIN 250 MG PO TABS: ORAL_TABLET | ORAL | 0 refills | Status: DC

## 2018-12-26 MED ORDER — ALBUTEROL SULFATE (2.5 MG/3ML) 0.083% IN NEBU
2.5000 mg | INHALATION_SOLUTION | Freq: Once | RESPIRATORY_TRACT | Status: AC
  Administered 2018-12-26: 2.5 mg via RESPIRATORY_TRACT
  Filled 2018-12-26: qty 3

## 2018-12-26 MED ORDER — IPRATROPIUM-ALBUTEROL 0.5-2.5 (3) MG/3ML IN SOLN
3.0000 mL | Freq: Once | RESPIRATORY_TRACT | Status: AC
  Administered 2018-12-26: 3 mL via RESPIRATORY_TRACT
  Filled 2018-12-26: qty 3

## 2018-12-26 NOTE — ED Triage Notes (Addendum)
Pt c/o of productive cough and sore throat since this morning.  Pt chronically on 3L [place]

## 2018-12-26 NOTE — Discharge Instructions (Addendum)
Use your albuterol nebulizer treatments 1 every 4-6 hours as needed.  Take the antibiotic as directed until its finished.  Monitor your blood sugar closely while taking the prednisone.  Discontinue if your blood sugars become too elevated.  Follow-up with your pulmonologist next week as previously scheduled.  Return to the ER for any worsening symptoms.

## 2018-12-26 NOTE — ED Provider Notes (Signed)
[facility] EMERGENCY DEPARTMENT Provider Note   CSN: [id number] Arrival date & time: 12/26/18  1436     History   Chief Complaint Chief Complaint  Patient presents with  . Cough    HPI Mary [name] is a 43 y.o. female.  HPI   Mary [name] is a 43 y.o. female with past medical history of asthma, CHF, diabetes, and COPD on continuous nasal O2 at 3 L, who presents to the Emergency Department complaining of sore throat, nasal congestion, and productive cough for less than 24 hours.  She states the cough is mostly nonproductive but occasionally she has some yellow to green sputum.  She also describes a sore throat and pain with swallowing fluids.  She states her significant other is also here for evaluation with similar symptoms.  She has been using her albuterol at home with temporary relief.  She has not had to increase her oxygen use.  She denies increased shortness of breath, fever, chills, dysuria, peripheral edema, and chest pain.  She denies missing any doses of her current medications.    Past Medical History:  Diagnosis Date  . Asthma   . Axillary adenopathy    right axillary adenopathy noted on CT chest (03/06/2012)  . Chronic right-sided CHF (congestive heart failure) (HCC) 03/23/2012   with cor pulmonale. Last RHC 03/2012  . COPD (chronic obstructive pulmonary disease) (HCC)    PFTS 03/08/12: fev1 0.58L/1%, FVC 1.18/33%, Ratop 49 and c/w ssevere obstruction. 21% BD response on FVC, RV 219%, DLCO 11/54%  . Diabetes mellitus without complication (HCC)   . DVT of upper extremity (deep vein thrombosis) [facility]) April 2013   right subclavian // Unclear precipitating cause - possibly significant right heart failure, with vascular stasis potentially predisposing to clotting.    . Exertional shortness of breath   . Hepatic cirrhosis (HCC)    Questionable history of - Noted on CT abdomen (03/2012) - thought to be due to vascular congestion from right heart failure +/-  patient's history of alcohol abuse  . History of alcoholism (HCC)   . History of chronic bronchitis   . Irregular menses   . Migraine    "~ 1/yr" (05/03/2013)  . Moderate to severe pulmonary hypertension [facility]) April 2013   Cardiac cath on 03/06/12 - 1. Elevated pulmonary artery pressures, right sided filling pressures.,  2. PA: 64/45 (mean 53)    . On home oxygen therapy    "2-3 L 24/7" (05/03/2013)  . OSA (obstructive sleep apnea)    wears noctural BiPAP (05/03/2013)  . Periodontal disease   . Pneumonia ~ 1985; 01/2011  . Pulmonary embolus [facility]) April 2013   Precipitating cause unclear. Was treated with coumadin from April-June 2013.  . Pulmonary hypertension [facility])     Patient Active Problem List   Diagnosis Date Noted  . Chronic pain 08/30/2018  . COPD exacerbation (HCC) 07/03/2018  . COPD with exacerbation (HCC) 05/03/2018  . COPD with acute exacerbation (HCC) 01/11/2018  . Left flank pain 11/01/2017  . Positive D dimer   . Leukocytosis 04/29/2017  . Acute exacerbation of chronic obstructive pulmonary disease (COPD) (HCC) 04/24/2017  . 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 Left ankle sprain 04/21/2017  . Fungemia 02/13/2017  . COPD (chronic obstructive pulmonary disease) (HCC) 02/13/2017  . Chronic respiratory failure with hypoxia and hypercapnia (HCC) 02/13/2017  . Thrombocytopenia (HCC) 02/01/2017  . Alcohol abuse 01/31/2017  . Acute on chronic respiratory failure with hypoxia and hypercapnia (HCC)   .  Tracheomalacia   . Palliative care by specialist   . Anxiety state   . Advance directive declined by patient   . Goals of care, counseling/discussion   . Chronic hoarseness 07/27/2016  . Diabetic neuropathy (HCC) 04/30/2016  . Diabetes mellitus type 2 in obese (HCC) 04/10/2016  . Chronic diastolic heart failure (HCC) 01/25/2016  . PAH (pulmonary artery hypertension) (HCC)   . Constipation 11/18/2015  . Morbid obesity (HCC) 11/18/2015  . Lung nodule 01/30/2015  .  Pulmonary mass 01/13/2015  . Gastroparesis   . Esophageal reflux   . Obstipation   . Hypokalemia 02/28/2013  . Financial difficulties 02/28/2013  . Obesity hypoventilation syndrome (HCC) 02/28/2013  . Myalgia 02/28/2013  . Anovulation 06/30/2012  . Preventative health care 05/05/2012  . Irregular menstrual cycle 04/27/2012  . Chronic right-sided CHF (congestive heart failure) (HCC) 03/23/2012  . Pulmonary hypertension (HCC) 03/23/2012  . Cor pulmonale (HCC) 03/21/2012  . OSA on BiPAP 03/19/2012  . Axillary lymphadenopathy 03/06/2012  . Congenital heart disease 01/28/2012  . Dental caries 01/28/2012    Past Surgical History:  Procedure Laterality Date  . APPENDECTOMY  05/03/2013  . CARDIAC CATHETERIZATION  03/2012  . CARDIAC CATHETERIZATION N/A 01/08/2016   Procedure: Right Heart Cath;  Surgeon: Dolores Patty, MD;  Location: [facility] INVASIVE CV LAB;  Service: Cardiovascular;  Laterality: N/A;  . CARDIAC SURGERY  05/11/1977   "my heart was backwards" (05/03/2013)  . LAPAROSCOPIC APPENDECTOMY N/A 05/03/2013   Procedure: APPENDECTOMY LAPAROSCOPIC;  Surgeon: Cherylynn Ridges, MD;  Location: [facility] OR;  Service: General;  Laterality: N/A;  . LUNG SURGERY  03/11/77  . RIGHT HEART CATHETERIZATION N/A 03/07/2012   Procedure: RIGHT HEART CATH;  Surgeon: Kathleene Hazel, MD;  Location[facility] CATH LAB;  Service: Cardiovascular;  Laterality: N/A;     OB History   No obstetric history on file.      Home Medications    Prior to Admission medications   Medication Sig Start Date End Date Taking? Authorizing Provider  albuterol (PROVENTIL HFA;VENTOLIN HFA) 108 (90 Base) MCG/ACT inhaler USE 2 PUFFS EVERY 6 HOURS AS NEEDED FOR WHEEZING 09/27/18   Kalman Shan, MD  albuterol (PROVENTIL) (2.5 MG/3ML) 0.083% nebulizer solution Take 3 mLs (2.5 mg total) by nebulization every 6 (six) hours as needed for wheezing or shortness of breath. 12/14/18   Kalman Shan, MD  atorvastatin (LIPITOR) 40 MG  tablet Take 1 tablet (40 mg total) by mouth daily. 08/30/18   Corwin Levins, MD  budesonide-formoterol [facility]) 160-4.5 MCG/ACT inhaler Inhale 2 puffs into the lungs daily. 09/27/18   Kalman Shan, MD  gabapentin (NEURONTIN) 300 MG capsule TAKE 2 CAPSULES (600 MG TOTAL) BY MOUTH 3 (THREE) TIMES DAILY. 10/16/18   Corwin Levins, MD  glucose blood (ONE TOUCH ULTRA TEST) test strip Use as instructed four times per day E11.9 12/08/18   Long, Arlyss Repress, MD  HUMALOG KWIKPEN 100 UNIT/ML KwikPen Inject 0.15 mLs (15 Units total) into the skin 3 (three) times daily with meals. Restart after finished with 70/30 insulin 12/08/18   Albrizze, [employer] E, PA-C  Insulin Glargine (BASAGLAR KWIKPEN) 100 UNIT/ML SOPN Inject 0.25 mLs (25 Units total) into the skin daily at 10 pm. E11.9  Restart after finished with 70/30 insulin 09/15/18   Tat, Onalee Hua, MD  Insulin Pen Needle (BD PEN NEEDLE NANO U/F) 32G X 4 MM MISC Use as directed four times per day daily E11.9 05/24/18   Corwin Levins, MD  pantoprazole (PROTONIX) 40 MG  tablet Take 1 tablet (40 mg total) by mouth 2 (two) times daily before a meal. 07/22/16   Jeralyn BennettZamora, Ezequiel, MD  potassium chloride SA (K-DUR,KLOR-CON) 20 MEQ tablet Take 2 tablets (40 mEq total) by mouth daily. 12/08/18 01/07/19  Albrizze, Kaitlyn E, PA-C  pregabalin (LYRICA) 50 MG capsule Take 1 capsule (50 mg total) by mouth 2 (two) times daily. 10/31/17   Plotnikov, Georgina QuintAleksei V, MD  Tiotropium Bromide Monohydrate (SPIRIVA RESPIMAT) 1.25 MCG/ACT AERS Inhale 2 puffs into the lungs daily. 09/27/18   Kalman Shanamaswamy, Murali, MD  torsemide (DEMADEX) 20 MG tablet TAKE 1 TABLET BY MOUTH EVERY DAY 12/12/18   Corwin LevinsJohn, [name], MD  traMADol (ULTRAM) 50 MG tablet Take 1 tablet (50 mg total) by mouth every 6 (six) hours as needed. 08/30/18   Corwin LevinsJohn, [name], MD    Family History Family History  Problem Relation Age of Onset  . Hypertension Father   . Heart disease Father        CHF; died age 42   . Alcohol abuse Father   . Other  Brother        died age 42 y.o overdose    Social History Social History   Tobacco Use  . Smoking status: Current Some Day Smoker    Packs/day: 0.25    Years: 25.00    Pack years: 6.25    Types: Cigarettes    Last attempt to quit: 01/08/2018    Years since quitting: 0.9  . Smokeless tobacco: Never Used  . Tobacco comment: a couple drags of one cigarettes  Substance Use Topics  . Alcohol use: Not Currently  . Drug use: No     Allergies   Bee venom   Review of Systems Review of Systems  Constitutional: Negative for appetite change, chills and fever.  HENT: Positive for congestion, sore throat and trouble swallowing. Negative for ear pain and voice change.   Respiratory: Positive for cough. Negative for chest tightness, shortness of breath and wheezing.   Cardiovascular: Negative for chest pain, palpitations and leg swelling.  Gastrointestinal: Negative for abdominal pain, nausea and vomiting.  Genitourinary: Negative for dysuria.  Musculoskeletal: Negative for arthralgias, neck pain and neck stiffness.  Skin: Negative for rash.  Neurological: Negative for dizziness, weakness and numbness.  Hematological: Negative for adenopathy.     Physical Exam Updated Vital Signs BP 110/77 (BP Location: Right Arm)   Pulse 95   Temp 98.7 F (37.1 C) (Oral)   Resp 20   Ht 5\' 2"  (1.575 m)   Wt 88.5 kg   LMP 11/26/2018   SpO2 99%   BMI 35.67 kg/m   Physical Exam Vitals signs and nursing note reviewed.  Constitutional:      General: She is not in acute distress.    Appearance: She is well-developed. She is not ill-appearing or toxic-appearing.  HENT:     Head: Normocephalic and atraumatic.     Right Ear: Tympanic membrane and ear canal normal.     Left Ear: Tympanic membrane and ear canal normal.     Mouth/Throat:     Mouth: Mucous membranes are moist.     Pharynx: Uvula midline. Posterior oropharyngeal erythema present. No oropharyngeal exudate.     Comments: Mild  erythema of the oropharynx without edema or exudate.  Uvula is midline and nonedematous. Eyes:     Pupils: Pupils are equal, round, and reactive to light.  Neck:     Musculoskeletal: Full passive range of motion without pain, normal  range of motion and neck supple.     Trachea: Phonation normal.  Cardiovascular:     Rate and Rhythm: Normal rate and regular rhythm.     Pulses: Normal pulses.     Heart sounds: No murmur.  Pulmonary:     Effort: Pulmonary effort is normal. No respiratory distress.     Breath sounds: No stridor. No rales.     Comments: Coarse lung sounds bilaterally that improved with cough.  No wheezing or stridor.  no accessory muscle use Chest:     Chest wall: No tenderness.  Abdominal:     General: There is no distension.     Palpations: Abdomen is soft.     Tenderness: There is no abdominal tenderness.  Musculoskeletal: Normal range of motion.        General: No swelling.     Right lower leg: No edema.     Left lower leg: No edema.  Lymphadenopathy:     Cervical: No cervical adenopathy.  Skin:    General: Skin is warm and dry.     Capillary Refill: Capillary refill takes less than 2 seconds.  Neurological:     General: No focal deficit present.     Mental Status: She is alert.     Sensory: No sensory deficit.     Motor: No weakness or abnormal muscle tone.      ED Treatments / Results  Labs (all labs ordered are listed, but only abnormal results are displayed) Labs Reviewed  BASIC METABOLIC PANEL - Abnormal; Notable for the following components:      Result Value   Chloride 92 (*)    Glucose, Bld 128 (*)    All other components within normal limits  CBC WITH DIFFERENTIAL/PLATELET - Abnormal; Notable for the following components:   MCV 101.8 (*)    Platelets 116 (*)    All other components within normal limits    EKG None  Radiology Dg Chest 2 View  Result Date: 12/26/2018 CLINICAL DATA:  Productive cough and sore throat since this morning  EXAM: CHEST - 2 VIEW COMPARISON:  12/08/2018 FINDINGS: Mild airway thickening bandlike density at the left lung base compatible with scarring or atelectasis. No appreciable consolidation. No blunting of the costophrenic angles. Heart size within normal limits. IMPRESSION: 1. Airway thickening is present, suggesting bronchitis or reactive airways disease. 2. Atelectasis versus scarring at the left lung base. Electronically Signed   By: Gaylyn Rong M.D.   On: 12/26/2018 18:56    Procedures Procedures (including critical care time)  Medications Ordered in ED Medications  ipratropium-albuterol (DUONEB) 0.5-2.5 (3) MG/3ML nebulizer solution 3 mL (has no administration in time range)  albuterol (PROVENTIL) (2.5 MG/3ML) 0.083% nebulizer solution 2.5 mg (has no administration in time range)     Initial Impression / Assessment and Plan / ED Course  I have reviewed the triage vital signs and the nursing notes.  Pertinent labs & imaging results that were available during my care of the patient were reviewed by me and considered in my medical decision making (see chart for details).     Patient on chronic O2 by nasal cannula at 3 L.  Work-up today is reassuring.  Vital signs also reassuring.  No tachypnea or tachycardia.  No concerning symptoms for ACS or CHF exacerbation.  X-ray negative for pneumonia.  doubt strep pharyngitis.  Patient appears appropriate for discharge home, she agrees to treatment plan with antibiotics, steroid burst and Tessalon.  She is also requesting  a refill of her DuoNeb.  She agrees to close outpatient follow-up, return precautions were discussed.  Final Clinical Impressions(s) / ED Diagnoses   Final diagnoses:  Bronchitis    ED Discharge Orders         Ordered    predniSONE (DELTASONE) 20 MG tablet  Daily     12/26/18 1909    azithromycin (ZITHROMAX) 250 MG tablet     12/26/18 1909    ipratropium-albuterol (DUONEB) 0.5-2.5 (3) MG/3ML SOLN  Every 4 hours PRN       12/26/18 1909    benzonatate (TESSALON) 200 MG capsule  3 times daily PRN     12/26/18 1909           Rosey Bathriplett, Meldon Hanzlik, PA-C 12/27/18 2230    Eber HongMiller, Brian, MD 12/29/18 1121

## 2018-12-28 ENCOUNTER — Emergency Department (HOSPITAL_COMMUNITY): Payer: 59

## 2018-12-28 ENCOUNTER — Inpatient Hospital Stay (HOSPITAL_COMMUNITY)
Admission: EM | Admit: 2018-12-28 | Discharge: 2019-01-02 | DRG: 193 | Disposition: A | Discharge status: Home Health | Payer: 59 | Attending: Internal Medicine | Admitting: Internal Medicine

## 2018-12-28 ENCOUNTER — Other Ambulatory Visit: Payer: Self-pay

## 2018-12-28 ENCOUNTER — Encounter (HOSPITAL_COMMUNITY): Payer: Self-pay | Admitting: Emergency Medicine

## 2018-12-28 ENCOUNTER — Encounter: Payer: Self-pay | Admitting: Internal Medicine

## 2018-12-28 DIAGNOSIS — Z91120 Patient's intentional underdosing of medication regimen due to financial hardship: Secondary | ICD-10-CM | POA: Diagnosis not present

## 2018-12-28 DIAGNOSIS — I2781 Cor pulmonale (chronic): Secondary | ICD-10-CM | POA: Diagnosis present

## 2018-12-28 DIAGNOSIS — J101 Influenza due to other identified influenza virus with other respiratory manifestations: Secondary | ICD-10-CM | POA: Diagnosis not present

## 2018-12-28 DIAGNOSIS — E860 Dehydration: Secondary | ICD-10-CM | POA: Diagnosis present

## 2018-12-28 DIAGNOSIS — E669 Obesity, unspecified: Secondary | ICD-10-CM | POA: Diagnosis not present

## 2018-12-28 DIAGNOSIS — F17210 Nicotine dependence, cigarettes, uncomplicated: Secondary | ICD-10-CM | POA: Diagnosis present

## 2018-12-28 DIAGNOSIS — I5032 Chronic diastolic (congestive) heart failure: Secondary | ICD-10-CM | POA: Diagnosis present

## 2018-12-28 DIAGNOSIS — K5900 Constipation, unspecified: Secondary | ICD-10-CM | POA: Diagnosis present

## 2018-12-28 DIAGNOSIS — Z8701 Personal history of pneumonia (recurrent): Secondary | ICD-10-CM

## 2018-12-28 DIAGNOSIS — I2721 Secondary pulmonary arterial hypertension: Secondary | ICD-10-CM | POA: Diagnosis not present

## 2018-12-28 DIAGNOSIS — E1140 Type 2 diabetes mellitus with diabetic neuropathy, unspecified: Secondary | ICD-10-CM | POA: Diagnosis present

## 2018-12-28 DIAGNOSIS — R000 Tachycardia, unspecified: Secondary | ICD-10-CM | POA: Diagnosis not present

## 2018-12-28 DIAGNOSIS — Z86711 Personal history of pulmonary embolism: Secondary | ICD-10-CM

## 2018-12-28 DIAGNOSIS — Z7952 Long term (current) use of systemic steroids: Secondary | ICD-10-CM | POA: Diagnosis not present

## 2018-12-28 DIAGNOSIS — Z811 Family history of alcohol abuse and dependence: Secondary | ICD-10-CM | POA: Diagnosis not present

## 2018-12-28 DIAGNOSIS — Z9049 Acquired absence of other specified parts of digestive tract: Secondary | ICD-10-CM

## 2018-12-28 DIAGNOSIS — E119 Type 2 diabetes mellitus without complications: Secondary | ICD-10-CM | POA: Diagnosis present

## 2018-12-28 DIAGNOSIS — Z9981 Dependence on supplemental oxygen: Secondary | ICD-10-CM | POA: Diagnosis not present

## 2018-12-28 DIAGNOSIS — Z79899 Other long term (current) drug therapy: Secondary | ICD-10-CM

## 2018-12-28 DIAGNOSIS — G43909 Migraine, unspecified, not intractable, without status migrainosus: Secondary | ICD-10-CM | POA: Diagnosis not present

## 2018-12-28 DIAGNOSIS — R0689 Other abnormalities of breathing: Secondary | ICD-10-CM | POA: Diagnosis not present

## 2018-12-28 DIAGNOSIS — Z91030 Bee allergy status: Secondary | ICD-10-CM

## 2018-12-28 DIAGNOSIS — Z794 Long term (current) use of insulin: Secondary | ICD-10-CM | POA: Diagnosis not present

## 2018-12-28 DIAGNOSIS — J9621 Acute and chronic respiratory failure with hypoxia: Secondary | ICD-10-CM | POA: Diagnosis not present

## 2018-12-28 DIAGNOSIS — E662 Morbid (severe) obesity with alveolar hypoventilation: Secondary | ICD-10-CM | POA: Diagnosis not present

## 2018-12-28 DIAGNOSIS — K219 Gastro-esophageal reflux disease without esophagitis: Secondary | ICD-10-CM | POA: Diagnosis present

## 2018-12-28 DIAGNOSIS — T380X5A Adverse effect of glucocorticoids and synthetic analogues, initial encounter: Secondary | ICD-10-CM

## 2018-12-28 DIAGNOSIS — J470 Bronchiectasis with acute lower respiratory infection: Secondary | ICD-10-CM | POA: Diagnosis present

## 2018-12-28 DIAGNOSIS — E1169 Type 2 diabetes mellitus with other specified complication: Secondary | ICD-10-CM | POA: Diagnosis present

## 2018-12-28 DIAGNOSIS — Z79891 Long term (current) use of opiate analgesic: Secondary | ICD-10-CM | POA: Diagnosis not present

## 2018-12-28 DIAGNOSIS — G629 Polyneuropathy, unspecified: Secondary | ICD-10-CM | POA: Diagnosis not present

## 2018-12-28 DIAGNOSIS — D696 Thrombocytopenia, unspecified: Secondary | ICD-10-CM | POA: Diagnosis present

## 2018-12-28 DIAGNOSIS — G4733 Obstructive sleep apnea (adult) (pediatric): Secondary | ICD-10-CM | POA: Diagnosis present

## 2018-12-28 DIAGNOSIS — J441 Chronic obstructive pulmonary disease with (acute) exacerbation: Secondary | ICD-10-CM | POA: Diagnosis present

## 2018-12-28 DIAGNOSIS — Z6832 Body mass index (BMI) 32.0-32.9, adult: Secondary | ICD-10-CM

## 2018-12-28 DIAGNOSIS — J111 Influenza due to unidentified influenza virus with other respiratory manifestations: Secondary | ICD-10-CM | POA: Diagnosis not present

## 2018-12-28 DIAGNOSIS — Z86718 Personal history of other venous thrombosis and embolism: Secondary | ICD-10-CM | POA: Diagnosis not present

## 2018-12-28 DIAGNOSIS — E099 Drug or chemical induced diabetes mellitus without complications: Secondary | ICD-10-CM | POA: Diagnosis present

## 2018-12-28 DIAGNOSIS — R0602 Shortness of breath: Secondary | ICD-10-CM | POA: Diagnosis not present

## 2018-12-28 DIAGNOSIS — R0902 Hypoxemia: Secondary | ICD-10-CM | POA: Diagnosis not present

## 2018-12-28 DIAGNOSIS — Z8249 Family history of ischemic heart disease and other diseases of the circulatory system: Secondary | ICD-10-CM | POA: Diagnosis not present

## 2018-12-28 LAB — CBC WITH DIFFERENTIAL/PLATELET
ABS IMMATURE GRANULOCYTES: 0.02 10*3/uL (ref 0.00–0.07)
Basophils Absolute: 0 10*3/uL (ref 0.0–0.1)
Basophils Relative: 0 %
Eosinophils Absolute: 0.2 10*3/uL (ref 0.0–0.5)
Eosinophils Relative: 3 %
HCT: 43.6 % (ref 36.0–46.0)
HEMOGLOBIN: 13.3 g/dL (ref 12.0–15.0)
Immature Granulocytes: 0 %
Lymphocytes Relative: 7 %
Lymphs Abs: 0.5 10*3/uL — ABNORMAL LOW (ref 0.7–4.0)
MCH: 30.8 pg (ref 26.0–34.0)
MCHC: 30.5 g/dL (ref 30.0–36.0)
MCV: 100.9 fL — ABNORMAL HIGH (ref 80.0–100.0)
Monocytes Absolute: 0.5 10*3/uL (ref 0.1–1.0)
Monocytes Relative: 7 %
NEUTROS ABS: 6.3 10*3/uL (ref 1.7–7.7)
Neutrophils Relative %: 83 %
Platelets: 100 10*3/uL — ABNORMAL LOW (ref 150–400)
RBC: 4.32 MIL/uL (ref 3.87–5.11)
RDW: 14.4 % (ref 11.5–15.5)
WBC: 7.6 10*3/uL (ref 4.0–10.5)
nRBC: 0 % (ref 0.0–0.2)

## 2018-12-28 LAB — COMPREHENSIVE METABOLIC PANEL
ALK PHOS: 97 U/L (ref 38–126)
ALT: 26 U/L (ref 0–44)
ANION GAP: 13 (ref 5–15)
AST: 82 U/L — ABNORMAL HIGH (ref 15–41)
Albumin: 4 g/dL (ref 3.5–5.0)
BUN: 8 mg/dL (ref 6–20)
CO2: 31 mmol/L (ref 22–32)
Calcium: 8.7 mg/dL — ABNORMAL LOW (ref 8.9–10.3)
Chloride: 89 mmol/L — ABNORMAL LOW (ref 98–111)
Creatinine, Ser: 0.59 mg/dL (ref 0.44–1.00)
GFR calc Af Amer: >60 mL/min (ref >60)
GFR calc non Af Amer: >60 mL/min (ref >60)
Glucose, Bld: 159 mg/dL — ABNORMAL HIGH (ref 70–99)
Potassium: 3.6 mmol/L (ref 3.5–5.1)
Sodium: 133 mmol/L — ABNORMAL LOW (ref 135–145)
Total Bilirubin: 3.2 mg/dL — ABNORMAL HIGH (ref 0.3–1.2)
Total Protein: 8.5 g/dL — ABNORMAL HIGH (ref 6.5–8.1)

## 2018-12-28 LAB — HEMOGLOBIN A1C
Hgb A1c MFr Bld: 6.6 % — ABNORMAL HIGH (ref 4.8–5.6)
Mean Plasma Glucose: 142.72 mg/dL

## 2018-12-28 LAB — GLUCOSE, CAPILLARY
GLUCOSE-CAPILLARY: 217 mg/dL — AB (ref 70–99)
Glucose-Capillary: 254 mg/dL — ABNORMAL HIGH (ref 70–99)
Glucose-Capillary: 261 mg/dL — ABNORMAL HIGH (ref 70–99)

## 2018-12-28 LAB — INFLUENZA PANEL BY PCR (TYPE A & B)
Influenza A By PCR: POSITIVE — AB
Influenza B By PCR: NEGATIVE

## 2018-12-28 LAB — TROPONIN I: Troponin I: <0.03 ng/mL (ref <0.03)

## 2018-12-28 LAB — BRAIN NATRIURETIC PEPTIDE: B Natriuretic Peptide: 58 pg/mL (ref 0.0–100.0)

## 2018-12-28 MED ORDER — BENZONATATE 100 MG PO CAPS
200.0000 mg | ORAL_CAPSULE | Freq: Three times a day (TID) | ORAL | Status: DC | PRN
  Administered 2018-12-28 – 2018-12-30 (×4): 200 mg via ORAL
  Filled 2018-12-28 (×4): qty 2

## 2018-12-28 MED ORDER — PANTOPRAZOLE SODIUM 40 MG PO TBEC
40.0000 mg | DELAYED_RELEASE_TABLET | Freq: Two times a day (BID) | ORAL | Status: DC
  Administered 2018-12-28 – 2019-01-02 (×11): 40 mg via ORAL
  Filled 2018-12-28 (×11): qty 1

## 2018-12-28 MED ORDER — ARFORMOTEROL TARTRATE 15 MCG/2ML IN NEBU
15.0000 ug | INHALATION_SOLUTION | Freq: Two times a day (BID) | RESPIRATORY_TRACT | Status: DC
  Administered 2018-12-28 – 2019-01-02 (×10): 15 ug via RESPIRATORY_TRACT
  Filled 2018-12-28 (×13): qty 2

## 2018-12-28 MED ORDER — GABAPENTIN 300 MG PO CAPS
600.0000 mg | ORAL_CAPSULE | Freq: Three times a day (TID) | ORAL | Status: DC
  Administered 2018-12-28 – 2019-01-02 (×14): 600 mg via ORAL
  Filled 2018-12-28 (×5): qty 2
  Filled 2018-12-28: qty 6
  Filled 2018-12-28 (×8): qty 2

## 2018-12-28 MED ORDER — ALBUTEROL SULFATE (2.5 MG/3ML) 0.083% IN NEBU: 2.5000 mg | INHALATION_SOLUTION | RESPIRATORY_TRACT | Status: DC

## 2018-12-28 MED ORDER — HEPARIN SODIUM (PORCINE) 5000 UNIT/ML IJ SOLN
5000.0000 [IU] | Freq: Three times a day (TID) | INTRAMUSCULAR | Status: DC
  Administered 2018-12-28 – 2019-01-02 (×14): 5000 [IU] via SUBCUTANEOUS
  Filled 2018-12-28 (×15): qty 1

## 2018-12-28 MED ORDER — SODIUM CHLORIDE 0.9 % IV SOLN
INTRAVENOUS | Status: AC
  Administered 2018-12-28: 22:00:00 via INTRAVENOUS

## 2018-12-28 MED ORDER — DOXYCYCLINE HYCLATE 100 MG PO TABS
100.0000 mg | ORAL_TABLET | Freq: Two times a day (BID) | ORAL | Status: DC
  Administered 2018-12-28 – 2019-01-02 (×11): 100 mg via ORAL
  Filled 2018-12-28 (×12): qty 1

## 2018-12-28 MED ORDER — ALBUTEROL SULFATE (2.5 MG/3ML) 0.083% IN NEBU
2.5000 mg | INHALATION_SOLUTION | RESPIRATORY_TRACT | Status: DC | PRN
  Administered 2018-12-30: 2.5 mg via RESPIRATORY_TRACT
  Filled 2018-12-28: qty 3

## 2018-12-28 MED ORDER — IPRATROPIUM-ALBUTEROL 0.5-2.5 (3) MG/3ML IN SOLN
3.0000 mL | Freq: Four times a day (QID) | RESPIRATORY_TRACT | Status: DC
  Administered 2018-12-28 – 2019-01-02 (×21): 3 mL via RESPIRATORY_TRACT
  Filled 2018-12-28 (×22): qty 3

## 2018-12-28 MED ORDER — METHYLPREDNISOLONE SODIUM SUCC 125 MG IJ SOLR
60.0000 mg | Freq: Three times a day (TID) | INTRAMUSCULAR | Status: DC
  Administered 2018-12-28 – 2018-12-31 (×10): 60 mg via INTRAVENOUS
  Filled 2018-12-28 (×10): qty 2

## 2018-12-28 MED ORDER — BUDESONIDE 0.5 MG/2ML IN SUSP
0.5000 mg | Freq: Two times a day (BID) | RESPIRATORY_TRACT | Status: DC
  Administered 2018-12-28 – 2019-01-02 (×9): 0.5 mg via RESPIRATORY_TRACT
  Filled 2018-12-28 (×13): qty 2

## 2018-12-28 MED ORDER — ATORVASTATIN CALCIUM 40 MG PO TABS
40.0000 mg | ORAL_TABLET | Freq: Every day | ORAL | Status: DC
  Administered 2018-12-28 – 2019-01-01 (×5): 40 mg via ORAL
  Filled 2018-12-28 (×5): qty 1

## 2018-12-28 MED ORDER — ALBUTEROL (5 MG/ML) CONTINUOUS INHALATION SOLN
15.0000 mg | INHALATION_SOLUTION | Freq: Once | RESPIRATORY_TRACT | Status: AC
  Administered 2018-12-28: 15 mg via RESPIRATORY_TRACT
  Filled 2018-12-28: qty 20

## 2018-12-28 MED ORDER — SODIUM CHLORIDE 0.9% FLUSH
3.0000 mL | Freq: Two times a day (BID) | INTRAVENOUS | Status: DC
  Administered 2018-12-28 – 2019-01-02 (×10): 3 mL via INTRAVENOUS

## 2018-12-28 MED ORDER — ONDANSETRON HCL 4 MG/2ML IJ SOLN
4.0000 mg | Freq: Once | INTRAMUSCULAR | Status: AC
  Administered 2018-12-28: 4 mg via INTRAVENOUS
  Filled 2018-12-28: qty 2

## 2018-12-28 MED ORDER — INSULIN GLARGINE 100 UNIT/ML ~~LOC~~ SOLN
20.0000 [IU] | Freq: Every day | SUBCUTANEOUS | Status: DC
  Administered 2018-12-28: 20 [IU] via SUBCUTANEOUS
  Filled 2018-12-28 (×4): qty 0.2

## 2018-12-28 MED ORDER — ORAL CARE MOUTH RINSE
15.0000 mL | Freq: Two times a day (BID) | OROMUCOSAL | Status: DC
  Administered 2018-12-28 – 2019-01-02 (×4): 15 mL via OROMUCOSAL

## 2018-12-28 MED ORDER — SODIUM CHLORIDE 0.9% FLUSH: 3.0000 mL | INTRAVENOUS | Status: DC | PRN

## 2018-12-28 MED ORDER — TRAMADOL HCL 50 MG PO TABS
50.0000 mg | ORAL_TABLET | Freq: Three times a day (TID) | ORAL | Status: DC | PRN
  Administered 2018-12-28 – 2019-01-02 (×11): 50 mg via ORAL
  Filled 2018-12-28 (×11): qty 1

## 2018-12-28 MED ORDER — SODIUM CHLORIDE 0.9 % IV SOLN: 250.0000 mL | INTRAVENOUS | Status: DC | PRN

## 2018-12-28 MED ORDER — OSELTAMIVIR PHOSPHATE 75 MG PO CAPS: 75.0000 mg | ORAL_CAPSULE | Freq: Once | ORAL | Status: DC

## 2018-12-28 MED ORDER — OSELTAMIVIR PHOSPHATE 75 MG PO CAPS
75.0000 mg | ORAL_CAPSULE | Freq: Two times a day (BID) | ORAL | Status: AC
  Administered 2018-12-28 – 2019-01-01 (×10): 75 mg via ORAL
  Filled 2018-12-28 (×10): qty 1

## 2018-12-28 MED ORDER — PREGABALIN 50 MG PO CAPS
50.0000 mg | ORAL_CAPSULE | Freq: Two times a day (BID) | ORAL | Status: DC
  Administered 2018-12-28 – 2019-01-02 (×10): 50 mg via ORAL
  Filled 2018-12-28: qty 1
  Filled 2018-12-28 (×4): qty 2
  Filled 2018-12-28: qty 1
  Filled 2018-12-28 (×4): qty 2

## 2018-12-28 MED ORDER — INSULIN ASPART 100 UNIT/ML ~~LOC~~ SOLN
0.0000 [IU] | Freq: Three times a day (TID) | SUBCUTANEOUS | Status: DC
  Administered 2018-12-28: 7 [IU] via SUBCUTANEOUS
  Administered 2018-12-28: 11 [IU] via SUBCUTANEOUS
  Administered 2018-12-29: 15 [IU] via SUBCUTANEOUS
  Administered 2018-12-29 (×2): 11 [IU] via SUBCUTANEOUS
  Administered 2018-12-30 (×2): 15 [IU] via SUBCUTANEOUS
  Administered 2018-12-30: 7 [IU] via SUBCUTANEOUS
  Administered 2018-12-31: 15 [IU] via SUBCUTANEOUS
  Administered 2018-12-31 (×2): 20 [IU] via SUBCUTANEOUS
  Administered 2019-01-01: 4 [IU] via SUBCUTANEOUS
  Administered 2019-01-01: 11 [IU] via SUBCUTANEOUS
  Administered 2019-01-02 (×2): 15 [IU] via SUBCUTANEOUS

## 2018-12-28 NOTE — ED Triage Notes (Signed)
Pt comes in via [facility] for SOB. When EMS arrived pt's O2 was 64% 3L/min. Breath sounds per EMS L side diminished, R side no air movement. Pt was diagnosed on 12/26/17 with bronchitis at [facility]

## 2018-12-28 NOTE — ED Provider Notes (Signed)
Emergency Department Provider Note   I have reviewed the triage vital signs and the nursing notes.   HISTORY  Chief Complaint Shortness of Breath   HPI Mary [name] is a 43 y.o. female with PMH of COPD, Pulmonary HTN, PE, and dCHF presents to the emergency department with worsening shortness of breath and hypoxemia.  Patient has had URI/bronchitis symptoms over the past week which are progressively worsening.  Patient was seen in the emergency department 2 days ago and treated for bronchitis.  She states she was given several medications but has been unable to fill those due to cost.  She tried to take it easy at home but shortness of breath symptoms worsen significantly over the past 24 hours.  She describes headache only with cough.  Denies chest pain.  EMS was called who recorded oxygen saturation in the 60s on scene with 3L [place].  Patient reports some mild nausea but no vomiting.  No abdominal discomfort.  No radiation of symptoms or other modifying factors.  Past Medical History:  Diagnosis Date  . Asthma   . Axillary adenopathy    right axillary adenopathy noted on CT chest (03/06/2012)  . Chronic right-sided CHF (congestive heart failure) (HCC) 03/23/2012   with cor pulmonale. Last RHC 03/2012  . COPD (chronic obstructive pulmonary disease) (HCC)    PFTS 03/08/12: fev1 0.58L/1%, FVC 1.18/33%, Ratop 49 and c/w ssevere obstruction. 21% BD response on FVC, RV 219%, DLCO 11/54%  . Diabetes mellitus without complication (HCC)   . DVT of upper extremity (deep vein thrombosis) [facility](HCC) April 2013   right subclavian // Unclear precipitating cause - possibly significant right heart failure, with vascular stasis potentially predisposing to clotting.    . Exertional shortness of breath   . Hepatic cirrhosis (HCC)    Questionable history of - Noted on CT abdomen (03/2012) - thought to be due to vascular congestion from right heart failure +/- patient's history of alcohol abuse  . History of  alcoholism (HCC)   . History of chronic bronchitis   . Irregular menses   . Migraine    "~ 1/yr" (05/03/2013)  . Moderate to severe pulmonary hypertension [facility](HCC) April 2013   Cardiac cath on 03/06/12 - 1. Elevated pulmonary artery pressures, right sided filling pressures.,  2. PA: 64/45 (mean 53)    . On home oxygen therapy    "2-3 L 24/7" (05/03/2013)  . OSA (obstructive sleep apnea)    wears noctural BiPAP (05/03/2013)  . Periodontal disease   . Pneumonia ~ 1985; 01/2011  . Pulmonary embolus [facility](HCC) April 2013   Precipitating cause unclear. Was treated with coumadin from April-June 2013.  . Pulmonary hypertension [facility](HCC)     Patient Active Problem List   Diagnosis Date Noted  . Acute on chronic respiratory failure with hypoxemia (HCC) 12/28/2018  . Chronic pain 08/30/2018  . COPD exacerbation (HCC) 07/03/2018  . COPD with exacerbation (HCC) 05/03/2018  . COPD with acute exacerbation (HCC) 01/11/2018  . Left flank pain 11/01/2017  . Positive D dimer   . Leukocytosis 04/29/2017  . Acute exacerbation of chronic obstructive pulmonary disease (COPD) (HCC) 04/24/2017  . 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 Left ankle sprain 04/21/2017  . Fungemia 02/13/2017  . COPD (chronic obstructive pulmonary disease) (HCC) 02/13/2017  . Chronic respiratory failure with hypoxia and hypercapnia (HCC) 02/13/2017  . Thrombocytopenia (HCC) 02/01/2017  . Alcohol abuse 01/31/2017  . Acute on chronic respiratory failure with hypoxia and hypercapnia (HCC)   .  Tracheomalacia   . Palliative care by specialist   . Anxiety state   . Advance directive declined by patient   . Goals of care, counseling/discussion   . Chronic hoarseness 07/27/2016  . Diabetic neuropathy (HCC) 04/30/2016  . Diabetes mellitus type 2 in obese (HCC) 04/10/2016  . Chronic diastolic heart failure (HCC) 01/25/2016  . PAH (pulmonary artery hypertension) (HCC)   . Constipation 11/18/2015  . Morbid obesity (HCC) 11/18/2015  . Lung  nodule 01/30/2015  . Pulmonary mass 01/13/2015  . Gastroparesis   . Esophageal reflux   . Obstipation   . Hypokalemia 02/28/2013  . Financial difficulties 02/28/2013  . Obesity hypoventilation syndrome (HCC) 02/28/2013  . Myalgia 02/28/2013  . Anovulation 06/30/2012  . Preventative health care 05/05/2012  . Irregular menstrual cycle 04/27/2012  . Chronic right-sided CHF (congestive heart failure) (HCC) 03/23/2012  . Pulmonary hypertension (HCC) 03/23/2012  . Cor pulmonale (HCC) 03/21/2012  . OSA on BiPAP 03/19/2012  . Axillary lymphadenopathy 03/06/2012  . Congenital heart disease 01/28/2012  . Dental caries 01/28/2012    Past Surgical History:  Procedure Laterality Date  . APPENDECTOMY  05/03/2013  . CARDIAC CATHETERIZATION  03/2012  . CARDIAC CATHETERIZATION N/A 01/08/2016   Procedure: Right Heart Cath;  Surgeon: Dolores Patty, MD;  Location: [facility] INVASIVE CV LAB;  Service: Cardiovascular;  Laterality: N/A;  . CARDIAC SURGERY  December 18, 1976   "my heart was backwards" (05/03/2013)  . LAPAROSCOPIC APPENDECTOMY N/A 05/03/2013   Procedure: APPENDECTOMY LAPAROSCOPIC;  Surgeon: Cherylynn Ridges, MD;  Location: [facility] OR;  Service: General;  Laterality: N/A;  . LUNG SURGERY  11/29/1977  . RIGHT HEART CATHETERIZATION N/A 03/07/2012   Procedure: RIGHT HEART CATH;  Surgeon: Kathleene Hazel, MD;  Location: [facility] CATH LAB;  Service: Cardiovascular;  Laterality: N/A;   Allergies Bee venom  Family History  Problem Relation Age of Onset  . Hypertension Father   . Heart disease Father        CHF; died age 66   . Alcohol abuse Father   . Other Brother        died age 52 y.o overdose    Social History Social History   Tobacco Use  . Smoking status: Current Some Day Smoker    Packs/day: 0.25    Years: 25.00    Pack years: 6.25    Types: Cigarettes    Last attempt to quit: 01/08/2018    Years since quitting: 0.9  . Smokeless tobacco: Never Used  . Tobacco comment: a couple drags of one  cigarettes  Substance Use Topics  . Alcohol use: Not Currently  . Drug use: No    Review of Systems  Constitutional: No fever/chills Eyes: No visual changes. ENT: No sore throat. Cardiovascular: Denies chest pain. Respiratory: Positive shortness of breath and cough.  Gastrointestinal: No abdominal pain.  No nausea, no vomiting.  No diarrhea.  No constipation. Genitourinary: Negative for dysuria. Musculoskeletal: Negative for back pain. Skin: Negative for rash. Neurological: Negative for headaches, focal weakness or numbness.  10-point ROS otherwise negative.  ____________________________________________   PHYSICAL EXAM:  VITAL SIGNS: ED Triage Vitals  Enc Vitals Group     BP --      Pulse Rate 12/28/18 0700 (!) 124     Resp 12/28/18 0700 (!) 24     Temp --      Temp src --      SpO2 12/28/18 0700 100 %     Weight 12/28/18 0657 195 lb (88.5 kg)  Height 12/28/18 0657 5\' 2"  (1.575 m)   Constitutional: Alert and oriented. Increased WOB noted but patient able to provide history.  Eyes: Conjunctivae are normal.  Head: Atraumatic. Nose: No congestion/rhinnorhea. Mouth/Throat: Mucous membranes are moist.  Neck: No stridor.  Cardiovascular: Tachycardia. Good peripheral circulation. Grossly normal heart sounds.   Respiratory: Increased respiratory effort.  No retractions. Decreased air entry bilaterally with end-expiratory wheezing bilaterally.  Gastrointestinal: Soft and nontender. No distention.  Musculoskeletal: No lower extremity tenderness nor edema. No gross deformities of extremities. Neurologic:  Normal speech and language. No gross focal neurologic deficits are appreciated.  Skin:  Skin is warm, dry and intact. No rash noted.  ____________________________________________   LABS (all labs ordered are listed, but only abnormal results are displayed)  Labs Reviewed  COMPREHENSIVE METABOLIC PANEL - Abnormal; Notable for the following components:      Result  Value   Sodium 133 (*)    Chloride 89 (*)    Glucose, Bld 159 (*)    Calcium 8.7 (*)    Total Protein 8.5 (*)    AST 82 (*)    Total Bilirubin 3.2 (*)    All other components within normal limits  CBC WITH DIFFERENTIAL/PLATELET - Abnormal; Notable for the following components:   MCV 100.9 (*)    Platelets 100 (*)    Lymphs Abs 0.5 (*)    All other components within normal limits  INFLUENZA PANEL BY PCR (TYPE A & B) - Abnormal; Notable for the following components:   Influenza A By PCR POSITIVE (*)    All other components within normal limits  GLUCOSE, CAPILLARY - Abnormal; Notable for the following components:   Glucose-Capillary 254 (*)    All other components within normal limits  MRSA PCR SCREENING  BRAIN NATRIURETIC PEPTIDE  TROPONIN I  HEMOGLOBIN A1C   ____________________________________________  EKG   EKG Interpretation  Date/Time:  Thursday December 28 2018 07:01:03 EST Ventricular Rate:  125 PR Interval:    QRS Duration: 98 QT Interval:  316 QTC Calculation: 456 R Axis:   101 Text Interpretation:  Sinus tachycardia LAE, consider biatrial enlargement Right axis deviation Low voltage, precordial leads Borderline T abnormalities, anterior leads No STEMI.  Confirmed by Alona Bene [phone]) on 12/28/2018 9:24:22 AM       ____________________________________________  RADIOLOGY  Dg Chest Portable 1 View  Result Date: 12/28/2018 CLINICAL DATA:  Shortness of breath and hypoxia EXAM: PORTABLE CHEST 1 VIEW COMPARISON:  Two days ago FINDINGS: Hyperinflation and chronic interstitial coarsening. There is no edema, consolidation, effusion, or pneumothorax. Normal heart size and mediastinal contours. IMPRESSION: COPD without acute superimposed finding. Electronically Signed   By: Marnee Spring M.D.   On: 12/28/2018 07:47    ____________________________________________   PROCEDURES  Procedure(s) performed:   Procedures  CRITICAL CARE Performed by: Maia Plan Total critical care time: 35 minutes Critical care time was exclusive of separately billable procedures and treating other patients. Critical care was necessary to treat or prevent imminent or life-threatening deterioration. Critical care was time spent personally by me on the following activities: development of treatment plan with patient and/or surrogate as well as nursing, discussions with consultants, evaluation of patient's response to treatment, examination of patient, obtaining history from patient or surrogate, ordering and performing treatments and interventions, ordering and review of laboratory studies, ordering and review of radiographic studies, pulse oximetry and re-evaluation of patient's condition.  Alona Bene, MD Emergency Medicine  ____________________________________________   INITIAL IMPRESSION / ASSESSMENT  AND PLAN / ED COURSE  Pertinent labs & imaging results that were available during my care of the patient were reviewed by me and considered in my medical decision making (see chart for details).  Presents to the emergency department with shortness of breath and hypoxemia.  This seems most consistent with a severe COPD exacerbation with hypoxemia.  Patient does have history of diastolic CHF but no evidence of volume overload.  She has associated upper respiratory tract infection symptoms.  I am sending labs, flu, portable chest x-ray, and will start on continuous neb. Lower suspicion for PE with URI type symptoms and wheezing.   09:20 AM Patient's labs resulted.  Electrolytes and renal function is normal.  Patient is positive for influenza A.  She is improving after 1 hour of continuous nebulizer.  Plan to transition to high flow nasal cannula for oxygenation and continue every 2 hour albuterol treatments.  Patient has improved clinically and I do not feel would benefit from BiPAP at this time.   Discussed patient's case with Hospitalist to request admission.  Patient and family (if present) updated with plan. Care transferred to Hospitalist service.  I reviewed all nursing notes, vitals, pertinent old records, EKGs, labs, imaging (as available).  ____________________________________________  FINAL CLINICAL IMPRESSION(S) / ED DIAGNOSES  Final diagnoses:  COPD exacerbation (HCC)  Hypoxemia  Influenza A    MEDICATIONS GIVEN DURING THIS VISIT:  Medications  heparin injection 5,000 Units (5,000 Units Subcutaneous Given 12/28/18 1306)  arformoterol (BROVANA) nebulizer solution 15 mcg (15 mcg Nebulization Not Given 12/28/18 1126)  budesonide (PULMICORT) nebulizer solution 0.5 mg (0.5 mg Nebulization Not Given 12/28/18 1126)  ipratropium-albuterol (DUONEB) 0.5-2.5 (3) MG/3ML nebulizer solution 3 mL (3 mLs Nebulization Not Given 12/28/18 1126)  doxycycline (VIBRA-TABS) tablet 100 mg (100 mg Oral Given 12/28/18 1005)  oseltamivir (TAMIFLU) capsule 75 mg (75 mg Oral Given 12/28/18 1004)  albuterol (PROVENTIL) (2.5 MG/3ML) 0.083% nebulizer solution 2.5 mg (has no administration in time range)  methylPREDNISolone sodium succinate (SOLU-MEDROL) 125 mg/2 mL injection 60 mg (60 mg Intravenous Given 12/28/18 1009)  pantoprazole (PROTONIX) EC tablet 40 mg (40 mg Oral Given 12/28/18 1005)  insulin glargine (LANTUS) injection 20 Units (has no administration in time range)  insulin aspart (novoLOG) injection 0-20 Units (11 Units Subcutaneous Given 12/28/18 1210)  traMADol (ULTRAM) tablet 50 mg (50 mg Oral Given 12/28/18 1210)  MEDLINE mouth rinse (15 mLs Mouth Rinse Not Given 12/28/18 1306)  ondansetron (ZOFRAN) injection 4 mg (4 mg Intravenous Given 12/28/18 0741)  albuterol (PROVENTIL,VENTOLIN) solution continuous neb (15 mg Nebulization Given 12/28/18 0825)    Note:  This document was prepared using Dragon voice recognition software and may include unintentional dictation errors.  Alona BeneJoshua Delvina Mizzell, MD Emergency Medicine    Taino Maertens, Arlyss RepressJoshua G, MD 12/28/18 1332

## 2018-12-28 NOTE — H&P (Signed)
History and Physical    Mary [name] ZDG:[id number] DOB: [birth year] DOA: 12/28/2018  Referring MD/NP/PA: Dr. Alona Bene PCP: Corwin Levins, MD  Patient coming from: home  Chief Complaint: SOB, general malaise  HPI: Mary [name] is a 43 y.o. female with a past medical history significant for chronic respiratory failure due to COPD (patient was on 3 L nasal cannula at baseline), chronic diastolic heart failure, type 2 diabetes mellitus with neuropathy, gastroesophageal reflux disease, obesity, obstructive sleep apnea, obesity hypoventilation syndrome, pulmonary hypertension and migraine; who presented to the emergency department secondary to worsening shortness of breath, sweating, general malaise and desaturations.  Patient was previously seen approximately 48 hours in the emergency department secondary to upper respiratory infection symptoms, a pontine she was diagnosed with acute on chronic bronchitis with mild COPD exacerbation and discharged home with instructions to complete treatment with Zithromax and steroids.  Patient did not have the financial funds to get medications and was hoping to get them later today when her husband get paid. She reported having worsening in her resp status, experiencing chills and sweats and despite the use of nebulizer couldn't catch her breath. Patient expressed ongoing productive cough (brownish-yellowish in appearance) and had decrease in oral intake. EMS was called due to severity in her symptoms and found her with O2 sat in mid 60's while using 3L. Patient denies focal weakness, nausea, vomiting, dysuria, hematuria, melena, hematochezia, abd pain, increase swelling or orthopnea.  In ED work up demonstrated positive influenza A infection, acute on chronic resp failure with hypoxia and Chronic bronchitic changes on CXR. Continue nebulization, steroids, tamiflu and increase oxygen supplementation provided. [facility] contacted to admit patient for further  evaluation and management.  Past Medical/Surgical History: Past Medical History:  Diagnosis Date  . Asthma   . Axillary adenopathy    right axillary adenopathy noted on CT chest (03/06/2012)  . Chronic right-sided CHF (congestive heart failure) (HCC) 03/23/2012   with cor pulmonale. Last RHC 03/2012  . COPD (chronic obstructive pulmonary disease) (HCC)    PFTS 03/08/12: fev1 0.58L/1%, FVC 1.18/33%, Ratop 49 and c/w ssevere obstruction. 21% BD response on FVC, RV 219%, DLCO 11/54%  . Diabetes mellitus without complication (HCC)   . DVT of upper extremity (deep vein thrombosis) [facility]) April 2013   right subclavian // Unclear precipitating cause - possibly significant right heart failure, with vascular stasis potentially predisposing to clotting.    . Exertional shortness of breath   . Hepatic cirrhosis (HCC)    Questionable history of - Noted on CT abdomen (03/2012) - thought to be due to vascular congestion from right heart failure +/- patient's history of alcohol abuse  . History of alcoholism (HCC)   . History of chronic bronchitis   . Irregular menses   . Migraine    "~ 1/yr" (05/03/2013)  . Moderate to severe pulmonary hypertension [facility]) April 2013   Cardiac cath on 03/06/12 - 1. Elevated pulmonary artery pressures, right sided filling pressures.,  2. PA: 64/45 (mean 53)    . On home oxygen therapy    "2-3 L 24/7" (05/03/2013)  . OSA (obstructive sleep apnea)    wears noctural BiPAP (05/03/2013)  . Periodontal disease   . Pneumonia ~ 1985; 01/2011  . Pulmonary embolus [facility]) April 2013   Precipitating cause unclear. Was treated with coumadin from April-June 2013.  . Pulmonary hypertension (HCC)     Past Surgical History:  Procedure Laterality Date  . APPENDECTOMY  05/03/2013  . CARDIAC CATHETERIZATION  03/2012  . CARDIAC CATHETERIZATION N/A 01/08/2016   Procedure: Right Heart Cath;  Surgeon: Dolores Pattyaniel R Bensimhon, MD;  [facility];  Service: Cardiovascular;  Laterality: N/A;   . CARDIAC SURGERY  10/16/1977   "my heart was backwards" (05/03/2013)  . LAPAROSCOPIC APPENDECTOMY N/A 05/03/2013   Procedure: APPENDECTOMY LAPAROSCOPIC;  Surgeon: Cherylynn RidgesJames O Wyatt, MD;  [facility];  Service: General;  Laterality: N/A;  . LUNG SURGERY  10/16/1977  . RIGHT HEART CATHETERIZATION N/A 03/07/2012   Procedure: RIGHT HEART CATH;  Surgeon: Kathleene Hazelhristopher D McAlhany, MD;  [facility];  Service: Cardiovascular;  Laterality: N/A;    Social History:  reports that she has been smoking cigarettes. She has a 6.25 pack-year smoking history. She has never used smokeless tobacco. She reports previous alcohol use. She reports that she does not use drugs.  Allergies: Allergies  Allergen Reactions  . Bee Venom Swelling    Pt states she swells up 3x normal size. If stung "2-3 times, my throat could close up"    Family History:  Family History  Problem Relation Age of Onset  . Hypertension Father   . Heart disease Father        CHF; died age 458   . Alcohol abuse Father   . Other Brother        died age 42 y.o overdose    Prior to Admission medications   Medication Sig Start Date End Date Taking? Authorizing Provider  albuterol (PROVENTIL HFA;VENTOLIN HFA) 108 (90 Base) MCG/ACT inhaler USE 2 PUFFS EVERY 6 HOURS AS NEEDED FOR WHEEZING 09/27/18  Yes Kalman Shanamaswamy, Murali, MD  albuterol (PROVENTIL) (2.5 MG/3ML) 0.083% nebulizer solution Take 3 mLs (2.5 mg total) by nebulization every 6 (six) hours as needed for wheezing or shortness of breath. 12/14/18  Yes Kalman Shanamaswamy, Murali, MD  atorvastatin (LIPITOR) 40 MG tablet Take 1 tablet (40 mg total) by mouth daily. 08/30/18  Yes Corwin LevinsJohn, [name], MD  budesonide-formoterol [facility](SYMBICORT) 160-4.5 MCG/ACT inhaler Inhale 2 puffs into the lungs daily. 09/27/18  Yes Kalman Shanamaswamy, Murali, MD  gabapentin (NEURONTIN) 300 MG capsule TAKE 2 CAPSULES (600 MG TOTAL) BY MOUTH 3 (THREE) TIMES DAILY. 10/16/18  Yes Corwin LevinsJohn, [name], MD  glucose blood (ONE TOUCH ULTRA TEST)  test strip Use as instructed four times per day E11.9 12/08/18  Yes Long, Arlyss RepressJoshua G, MD  HUMALOG KWIKPEN 100 UNIT/ML KwikPen Inject 0.15 mLs (15 Units total) into the skin 3 (three) times daily with meals. Restart after finished with 70/30 insulin 12/08/18  Yes Albrizze, Kaitlyn E, PA-C  Insulin Glargine (BASAGLAR KWIKPEN) 100 UNIT/ML SOPN Inject 0.25 mLs (25 Units total) into the skin daily at 10 pm. E11.9  Restart after finished with 70/30 insulin 09/15/18  Yes Tat, Onalee Huaavid, MD  Insulin Pen Needle (BD PEN NEEDLE NANO U/F) 32G X 4 MM MISC Use as directed four times per day daily E11.9 05/24/18  Yes Corwin LevinsJohn, [name], MD  pantoprazole (PROTONIX) 40 MG tablet Take 1 tablet (40 mg total) by mouth 2 (two) times daily before a meal. 07/22/16  Yes Jeralyn BennettZamora, Ezequiel, MD  potassium chloride SA (K-DUR,KLOR-CON) 20 MEQ tablet Take 2 tablets (40 mEq total) by mouth daily. 12/08/18 01/07/19 Yes Albrizze, Kaitlyn E, PA-C  predniSONE (DELTASONE) 20 MG tablet Take 2 tablets (40 mg total) by mouth daily. 12/26/18  Yes Triplett, Tammy, PA-C  pregabalin (LYRICA) 50 MG capsule Take 1 capsule (50 mg total) by mouth 2 (two) times daily. 10/31/17  Yes Plotnikov, Georgina QuintAleksei V,  MD  Tiotropium Bromide Monohydrate (SPIRIVA RESPIMAT) 1.25 MCG/ACT AERS Inhale 2 puffs into the lungs daily. 09/27/18  Yes Kalman Shanamaswamy, Murali, MD  torsemide (DEMADEX) 20 MG tablet TAKE 1 TABLET BY MOUTH EVERY DAY 12/12/18  Yes Corwin LevinsJohn, [name], MD  traMADol (ULTRAM) 50 MG tablet Take 1 tablet (50 mg total) by mouth every 6 (six) hours as needed. 08/30/18  Yes Corwin LevinsJohn, [name], MD  benzonatate (TESSALON) 200 MG capsule Take 1 capsule (200 mg total) by mouth 3 (three) times daily as needed for cough. 12/26/18   Triplett, Tammy, PA-C  ipratropium-albuterol (DUONEB) 0.5-2.5 (3) MG/3ML SOLN Take 3 mLs by nebulization every 4 (four) hours as needed. 12/26/18   Triplett, Tammy, PA-C    Review of Systems: Negative except as otherwise mentioned in HPI.   Physical Exam: Vitals:    12/28/18 1800 12/28/18 1830 12/28/18 2052 12/28/18 2056  BP:  100/66    Pulse: 97 99    Resp: 16     Temp:      TempSrc:      SpO2: 91% 96% 93% 93%  Weight:      Height:        Constitutional: In mild distress secondary to respiratory distress.  Unable to finish full sentences while speaking on tachypneic with minimal exertion.  Expressed pleuritic chest pain with coughing spells. Eyes: PERRL, lids and conjunctivae normal, no icterus, no nystagmus. ENMT: Mucous membranes are dry on examination.  Poor dentition, no thrush. Posterior pharynx clear of any exudate or lesions. Neck: normal, supple, no masses, no thyromegaly Respiratory: Tachypneic with minimal exertion, very poor air movement, positive rhonchi, positive expiratory wheezing.  Patient wearing 9 L nasal cannula supplementation after using a nonrebreather mask for multiple hours prior to being admitted to stepdown. Cardiovascular: Sinus tachycardia, no rubs, no gallops, no murmurs.  Unable to properly assess for JVD due to body habitus.   Abdomen: Obese, no tenderness, no masses palpated. No hepatosplenomegaly. Bowel sounds positive.  Musculoskeletal: no clubbing / cyanosis. No joint deformity upper and lower extremities. Good ROM, no contractures. Normal muscle tone.  Skin: no rashes, lesions, or open wounds. No induration Neurologic: CN 2-12 grossly intact. Sensation intact, DTR normal. Strength 4/5 in all 4 due to poor effort.Marland Kitchen.  Psychiatric: Normal judgment and insight. Alert and oriented x 3. Normal mood.    Labs on Admission: I have personally reviewed the following labs and imaging studies  CBC: Recent Labs  Lab 12/26/18 1703 12/28/18 0757  WBC 6.5 7.6  NEUTROABS 4.7 6.3  HGB 14.2 13.3  HCT 45.7 43.6  MCV 101.8* 100.9*  PLT 116* 100*   Basic Metabolic Panel: Recent Labs  Lab 12/26/18 1703 12/28/18 0757  NA 135 133*  K 3.6 3.6  CL 92* 89*  CO2 32 31  GLUCOSE 128* 159*  BUN 6 8  CREATININE 0.46 0.59    CALCIUM 9.3 8.7*   GFR: Estimated Creatinine Clearance: 95 mL/min (by C-G formula based on SCr of 0.59 mg/dL).   Liver Function Tests: Recent Labs  Lab 12/28/18 0757  AST 82*  ALT 26  ALKPHOS 97  BILITOT 3.2*  PROT 8.5*  ALBUMIN 4.0   Cardiac Enzymes: Recent Labs  Lab 12/28/18 0757  TROPONINI <0.03   HbA1C: Recent Labs    12/28/18 0757  HGBA1C 6.6*   CBG: Recent Labs  Lab 12/28/18 1200 12/28/18 1621  GLUCAP 254* 217*   Urine analysis:    Component Value Date/Time   COLORURINE AMBER (A) 12/08/2018 2051  APPEARANCEUR HAZY (A) 12/08/2018 2051   LABSPEC 1.028 12/08/2018 2051   PHURINE 7.0 12/08/2018 2051   GLUCOSEU >=500 (A) 12/08/2018 2051   GLUCOSEU 100 (A) 02/24/2018 1644   HGBUR NEGATIVE 12/08/2018 2051   BILIRUBINUR SMALL (A) 12/08/2018 2051   KETONESUR NEGATIVE 12/08/2018 2051   PROTEINUR NEGATIVE 12/08/2018 2051   UROBILINOGEN 2.0 (A) 02/24/2018 1644   NITRITE NEGATIVE 12/08/2018 2051   LEUKOCYTESUR NEGATIVE 12/08/2018 2051    Radiological Exams on Admission: Dg Chest Portable 1 View  Result Date: 12/28/2018 CLINICAL DATA:  Shortness of breath and hypoxia EXAM: PORTABLE CHEST 1 VIEW COMPARISON:  Two days ago FINDINGS: Hyperinflation and chronic interstitial coarsening. There is no edema, consolidation, effusion, or pneumothorax. Normal heart size and mediastinal contours. IMPRESSION: COPD without acute superimposed finding. Electronically Signed   By: Marnee Spring M.D.   On: 12/28/2018 07:47    EKG: Independently reviewed. Normal QT; sinus tachycardia, right axis deviation. No acute ischemic changes.  Assessment/Plan 1-Acute on chronic respiratory failure with hypoxemia (HCC) -in the setting of acute influenza A infection. -bronchiectasis and COPD exacerbation -patient required NRB mask on admission, usually only uses 3L. -will wean oxygen supplementation as tolerated. -Start patient on Brovana, Pulmicort, DuoNeb and doxycycline -Gentle  fluid resuscitation and as needed antipyretics/analgesics. -Patient will be treated with Tamiflu -Start the use of flutter valve and Tessalon Perles. -Follow clinical response  2-OSA on BiPAP -Weight loss has been encouraged -Continue the use of BiPAP nightly  3-obesity class 1 with Obesity hypoventilation syndrome (HCC) -Body mass index is 32.8 kg/m. -Low calorie diet, portion control and increase physical activity has been discussed. -As mentioned above continue oxygen supplementation and nightly BiPAP.  4-Esophageal reflux -Continue the use of PPI  5-PAH (pulmonary artery hypertension) (HCC) -Continue outpatient follow-up with pulmonary service -Low-sodium diet continue use of BiPAP has been encouraged -Overnight hold the use of diuretics, follow intake/output.  6-Chronic diastolic heart failure (HCC) -follow daily weights -strict I's and O's -instructed to follow low sodium diet -holding diuretics overnight as patient mildly dehydrated. -follow volume status  7-Diabetes mellitus type 2 in obese [facility]) -will continue long acting and SSI -check A1C  8-Thrombocytopenia (HCC) -chronic and stable -will follow trend -no signs of acute bleeding appreciated  9-Migraine -continue PRN tramadol  10-Neuropathy  -continue Neurontin and Lyrica   DVT prophylaxis: heparin  Code Status: Full  Family Communication: husband at bedside Disposition Plan: home once breathing stabilized. Consults called: none  Admission status: inpatient, LOS > 2 midnights, stepdown.   Time Spent: 70 minutes  Vassie Loll MD [facility] Pager [phone]  12/28/2018, 8:57 PM

## 2018-12-29 LAB — BASIC METABOLIC PANEL
Anion gap: 9 (ref 5–15)
BUN: 13 mg/dL (ref 6–20)
CO2: 32 mmol/L (ref 22–32)
Calcium: 8.7 mg/dL — ABNORMAL LOW (ref 8.9–10.3)
Chloride: 91 mmol/L — ABNORMAL LOW (ref 98–111)
Creatinine, Ser: 0.52 mg/dL (ref 0.44–1.00)
GFR calc Af Amer: >60 mL/min (ref >60)
GLUCOSE: 295 mg/dL — AB (ref 70–99)
Potassium: 4.3 mmol/L (ref 3.5–5.1)
Sodium: 132 mmol/L — ABNORMAL LOW (ref 135–145)

## 2018-12-29 LAB — CBC
HCT: 40.8 % (ref 36.0–46.0)
Hemoglobin: 12.5 g/dL (ref 12.0–15.0)
MCH: 30.8 pg (ref 26.0–34.0)
MCHC: 30.6 g/dL (ref 30.0–36.0)
MCV: 100.5 fL — ABNORMAL HIGH (ref 80.0–100.0)
Platelets: 94 10*3/uL — ABNORMAL LOW (ref 150–400)
RBC: 4.06 MIL/uL (ref 3.87–5.11)
RDW: 13.5 % (ref 11.5–15.5)
WBC: 5.7 10*3/uL (ref 4.0–10.5)
nRBC: 0 % (ref 0.0–0.2)

## 2018-12-29 LAB — GLUCOSE, CAPILLARY
Glucose-Capillary: 253 mg/dL — ABNORMAL HIGH (ref 70–99)
Glucose-Capillary: 291 mg/dL — ABNORMAL HIGH (ref 70–99)
Glucose-Capillary: 310 mg/dL — ABNORMAL HIGH (ref 70–99)
Glucose-Capillary: 333 mg/dL — ABNORMAL HIGH (ref 70–99)

## 2018-12-29 LAB — MRSA PCR SCREENING: MRSA by PCR: NEGATIVE

## 2018-12-29 MED ORDER — PRO-STAT SUGAR FREE PO LIQD
30.0000 mL | Freq: Two times a day (BID) | ORAL | Status: DC
  Administered 2018-12-29 – 2019-01-01 (×7): 30 mL via ORAL
  Filled 2018-12-29 (×8): qty 30

## 2018-12-29 MED ORDER — ONDANSETRON HCL 4 MG/2ML IJ SOLN
4.0000 mg | Freq: Four times a day (QID) | INTRAMUSCULAR | Status: DC | PRN
  Administered 2018-12-29 – 2019-01-02 (×5): 4 mg via INTRAVENOUS
  Filled 2018-12-29 (×5): qty 2

## 2018-12-29 MED ORDER — POLYETHYLENE GLYCOL 3350 17 G PO PACK
17.0000 g | PACK | Freq: Every day | ORAL | Status: DC | PRN
  Administered 2018-12-30: 17 g via ORAL
  Filled 2018-12-29: qty 1

## 2018-12-29 MED ORDER — INSULIN ASPART 100 UNIT/ML ~~LOC~~ SOLN
10.0000 [IU] | Freq: Three times a day (TID) | SUBCUTANEOUS | Status: DC
  Administered 2018-12-29 – 2019-01-01 (×9): 10 [IU] via SUBCUTANEOUS

## 2018-12-29 MED ORDER — INSULIN GLARGINE 100 UNIT/ML ~~LOC~~ SOLN
25.0000 [IU] | Freq: Every day | SUBCUTANEOUS | Status: DC
  Administered 2018-12-29 – 2018-12-31 (×3): 25 [IU] via SUBCUTANEOUS
  Filled 2018-12-29 (×4): qty 0.25

## 2018-12-29 NOTE — Care Management Note (Signed)
Case Management Note  Patient Details  Name: Mary [name] MRN: [id number] Date of Birth: 29-May-1976  Subjective/Objective:       COPD. From home. Independent. Has home oxygen. Seen by PT, recommended for home health PT with patient. She is only instructed in PT if she has no copays. No preference of agencies.               Action/Plan: Will have [facility] run cost.   Expected Discharge Date:                  Expected Discharge Plan:     In-House Referral:     Discharge planning Services  CM Consult  Post Acute Care Choice:  Home Health Choice offered to:  Patient  DME Arranged:    DME Agency:     HH Arranged:  PT HH Agency:  [facility]  Status of Service:  In process, will continue to follow  If discussed at Long Length of Stay Meetings, dates discussed:    Additional Comments:  Lanson Randle, Chrystine Oiler, RN 12/29/2018, 11:06 AM

## 2018-12-29 NOTE — Progress Notes (Signed)
PROGRESS NOTE    Mary [name]  ZTA:[id number] DOB: [birth year] DOA: 12/28/2018 PCP: Corwin Levins, MD     Brief Narrative:  43 y.o. female with a past medical history significant for chronic respiratory failure due to COPD (patient was on 3 L nasal cannula at baseline), chronic diastolic heart failure, type 2 diabetes mellitus with neuropathy, gastroesophageal reflux disease, obesity, obstructive sleep apnea, obesity hypoventilation syndrome, pulmonary hypertension and migraine; who presented to the emergency department secondary to worsening shortness of breath, sweating, general malaise and desaturations.  Patient was previously seen approximately 48 hours in the emergency department secondary to upper respiratory infection symptoms, a pontine she was diagnosed with acute on chronic bronchitis with mild COPD exacerbation and discharged home with instructions to complete treatment with Zithromax and steroids.  Patient did not have the financial funds to get medications and was hoping to get them later today when her husband get paid. She reported having worsening in her resp status, experiencing chills and sweats and despite the use of nebulizer couldn't catch her breath. Patient expressed ongoing productive cough (brownish-yellowish in appearance) and had decrease in oral intake. EMS was called due to severity in her symptoms and found her with O2 sat in mid 60's while using 3L.  Patient admitted with acute on chronic respiratory failure secondary to influenza infection, bronchiectasis and COPD exacerbation.   Assessment & Plan: 1-Acute on chronic respiratory failure with hypoxemia (HCC) -In the setting of influenza a infection and COPD exacerbation/bronchiectasis -Slowly improving; still with diffuse respiratory wheezing and having difficulty speaking in full sentences. -Requiring 6 L nasal cannula supplementation (chronically on 3 L nasal cannula). -Will continue the use of Tamiflu,  antibiotics, nebulizer treatments, IV steroids, flutter valve add antitussives.  2-OHS, OSA on BiPAP -Continue BiPAP nightly -Weight loss recommended.  3-Influenza A -Treatment as mentioned above, continue Tamiflu and supportive care.  4-Esophageal reflux -Continue PPI.   5-PAH (pulmonary artery hypertension) (HCC) -Weight loss recommended -Continue the use of BiPAP -Diuretics with anticipation to resume in a.m. -Follow daily weight and strict I's and O's.  6-Chronic diastolic heart failure (HCC) -Compensated -Fluids discontinued -Low-sodium diet has been instructed -Follow daily weights and strict I's and O's -Plan to resume diuretics in a.m.  7-Diabetes mellitus type 2 in obese (HCC) -CBG slightly worse in the setting of his steroids use -Will continue Lantus (increasing to 25 units), continue resistant sliding scale insulin and initiate 3 times daily meal coverage using 10 units of NovoLog if the patient has eaten more than 50% of her trays.  8-thrombocytopenia (HCC) -Chronic and stable -Follow trend -No signs of acute bleeding.  9-Migraine -Continue as needed pain medication -Denies headaches at this moment.  10-Neuropathy -Continue the use of Lyrica and gabapentin     DVT prophylaxis: Heparin Code Status: Full code Family Communication: No family at bedside. Disposition Plan: Remains inpatient, continue Tamiflu, continue nebulizer, IV steroids, antibiotics, oxygen supplementation and the use of flutter valve/antitussives.  Follow clinical response.  Depending on stability potential transfer to the floor later today; still not adequately improved to go home with outpatient treatment.  Consultants:   None  Procedures:   See below for x-ray reports.  Antimicrobials:  Anti-infectives (From admission, onward)   Start     Dose/Rate Route Frequency Ordered Stop   12/28/18 1000  doxycycline (VIBRA-TABS) tablet 100 mg     100 mg Oral Every 12 hours 12/28/18  0942     12/28/18 1000  oseltamivir (TAMIFLU) capsule 75  mg     75 mg Oral 2 times daily 12/28/18 0942 01/02/19 0959   12/28/18 0930  oseltamivir (TAMIFLU) capsule 75 mg  Status:  Discontinued     75 mg Oral  Once 12/28/18 [id number] 12/28/18 1159      Subjective: Activity is limited due to shortness of breath, having difficulty speaking in full sentences, no fever, no chest pain, no nausea or vomiting.  Objective: Vitals:   12/29/18 0800 12/29/18 0814 12/29/18 0900 12/29/18 1021  BP: 106/67  117/78   Pulse: 79 86 (!) 103   Resp: (!) 23 17 17    Temp: 98.2 F (36.8 C)     TempSrc:      SpO2: 93% 92% 90% 96%  Weight:      Height:        Intake/Output Summary (Last 24 hours) at 12/29/2018 1056 Last data filed at 12/29/2018 0900 Gross per 24 hour  Intake 504.8 ml  Output -  Net 504.8 ml   Filed Weights   12/28/18 0657 12/28/18 1156  Weight: 88.5 kg 84 kg    Examination: General exam: Alert, awake, oriented x 3; still short of breath with minimal exertion, experiencing diffuse expiratory wheezing and having difficulty speaking in full sentences.  Wearing 6 L nasal cannula supplementation. Respiratory system: Positive rhonchi, diffuse expiratory wheezing, tachypneic with minimal exertion; no using accessory muscles. Cardiovascular system: Sinus tachycardia, no rubs, no gallops, positive systolic ejection murmur.  No JVD on exam. Gastrointestinal system: Abdomen is nondistended, soft and nontender. No organomegaly or masses felt. Normal bowel sounds heard. Central nervous system: Alert and oriented. No focal neurological deficits. Extremities: No C/C/E, +pedal pulses Skin: No rashes, lesions or ulcers Psychiatry: Judgement and insight appear normal. Mood & affect appropriate.     Data Reviewed: I have personally reviewed following labs and imaging studies  CBC: Recent Labs  Lab 12/26/18 1703 12/28/18 0757 12/29/18 0443  WBC 6.5 7.6 5.7  NEUTROABS 4.7 6.3  --   HGB 14.2  13.3 12.5  HCT 45.7 43.6 40.8  MCV 101.8* 100.9* 100.5*  PLT 116* 100* 94*   Basic Metabolic Panel: Recent Labs  Lab 12/26/18 1703 12/28/18 0757 12/29/18 0443  NA 135 133* 132*  K 3.6 3.6 4.3  CL 92* 89* 91*  CO2 32 31 32  GLUCOSE 128* 159* 295*  BUN 6 8 13   CREATININE 0.46 0.59 0.52  CALCIUM 9.3 8.7* 8.7*   GFR: Estimated Creatinine Clearance: 95 mL/min (by C-G formula based on SCr of 0.52 mg/dL).   Liver Function Tests: Recent Labs  Lab 12/28/18 0757  AST 82*  ALT 26  ALKPHOS 97  BILITOT 3.2*  PROT 8.5*  ALBUMIN 4.0   Cardiac Enzymes: Recent Labs  Lab 12/28/18 0757  TROPONINI <0.03   HbA1C: Recent Labs    12/28/18 0757  HGBA1C 6.6*   CBG: Recent Labs  Lab 12/28/18 1200 12/28/18 1621 12/28/18 2136 12/29/18 0741  GLUCAP 254* 217* 261* 291*   Urine analysis:    Component Value Date/Time   COLORURINE AMBER (A) 12/08/2018 2051   APPEARANCEUR HAZY (A) 12/08/2018 2051   LABSPEC 1.028 12/08/2018 2051   PHURINE 7.0 12/08/2018 2051   GLUCOSEU >=500 (A) 12/08/2018 2051   GLUCOSEU 100 (A) 02/24/2018 1644   HGBUR NEGATIVE 12/08/2018 2051   BILIRUBINUR SMALL (A) 12/08/2018 2051   KETONESUR NEGATIVE 12/08/2018 2051   PROTEINUR NEGATIVE 12/08/2018 2051   UROBILINOGEN 2.0 (A) 02/24/2018 1644   NITRITE NEGATIVE 12/08/2018 2051   LEUKOCYTESUR  NEGATIVE 12/08/2018 2051    Recent Results (from the past 240 hour(s))  MRSA PCR Screening     Status: None   Collection Time: 12/28/18 11:57 AM  Result Value Ref Range Status   MRSA by PCR NEGATIVE NEGATIVE Final    Comment:        The GeneXpert MRSA Assay (FDA approved for NASAL specimens only), is one component of a comprehensive MRSA colonization surveillance program. It is not intended to diagnose MRSA infection nor to guide or monitor treatment for MRSA infections. Performed at [facility], [address] [place], [place] [address]      Radiology Studies: Dg Chest Portable 1 View  Result Date:  12/28/2018 CLINICAL DATA:  Shortness of breath and hypoxia EXAM: PORTABLE CHEST 1 VIEW COMPARISON:  Two days ago FINDINGS: Hyperinflation and chronic interstitial coarsening. There is no edema, consolidation, effusion, or pneumothorax. Normal heart size and mediastinal contours. IMPRESSION: COPD without acute superimposed finding. Electronically Signed   By: Marnee Spring M.D.   On: 12/28/2018 07:47    Scheduled Meds: . arformoterol  15 mcg Nebulization BID  . atorvastatin  40 mg Oral q1800  . budesonide (PULMICORT) nebulizer solution  0.5 mg Nebulization BID  . doxycycline  100 mg Oral Q12H  . gabapentin  600 mg Oral TID  . heparin injection (subcutaneous)  5,000 Units Subcutaneous Q8H  . insulin aspart  0-20 Units Subcutaneous TID WC  . insulin aspart  10 Units Subcutaneous TID WC  . insulin glargine  25 Units Subcutaneous QHS  . ipratropium-albuterol  3 mL Nebulization Q6H  . mouth rinse  15 mL Mouth Rinse BID  . methylPREDNISolone (SOLU-MEDROL) injection  60 mg Intravenous Q8H  . oseltamivir  75 mg Oral BID  . pantoprazole  40 mg Oral BID  . pregabalin  50 mg Oral BID  . sodium chloride flush  3 mL Intravenous Q12H   Continuous Infusions: . sodium chloride 50 mL/hr at 12/29/18 0900  . sodium chloride       LOS: 1 day    Time spent: 35 minutes. Greater than 50% of this time was spent in direct contact with the patient, coordinating care and discussing relevant ongoing clinical issues, including discussion of bowel did need of nebulizer treatment, antibiotics and antiviral medications.  Patient is still requiring 6 L nasal cannula and tolerating well BiPAP nightly.  All questions answered and case discussed with nursing staff for coordination of care.    Vassie Loll, MD [facility] Pager [phone]   12/29/2018, 10:56 AM

## 2018-12-29 NOTE — Evaluation (Signed)
Physical Therapy Evaluation Patient Details Name: Mary [name] MRN: [id number] DOB: October 15, 1976 Today's Date: 12/29/2018   History of Present Illness  Mary [name] is a 43 y.o. female with a past medical history significant for chronic respiratory failure due to COPD (patient was on 3 L nasal cannula at baseline), chronic diastolic heart failure, type 2 diabetes mellitus with neuropathy, gastroesophageal reflux disease, obesity, obstructive sleep apnea, obesity hypoventilation syndrome, pulmonary hypertension and migraine; who presented to the emergency department secondary to worsening shortness of breath, sweating, general malaise and desaturations.  Patient was previously seen approximately 48 hours in the emergency department secondary to upper respiratory infection symptoms, a pontine she was diagnosed with acute on chronic bronchitis with mild COPD exacerbation and discharged home with instructions to complete treatment with Zithromax and steroids.  Patient did not have the financial funds to get medications and was hoping to get them later today when her husband get paid. She reported having worsening in her resp status, experiencing chills and sweats and despite the use of nebulizer couldn't catch her breath. Patient expressed ongoing productive cough (brownish-yellowish in appearance) and had decrease in oral intake. EMS was called due to severity in her symptoms and found her with O2 sat in mid 60's while using 3L.    Clinical Impression  Patient functioning near baseline for functional mobility and gait, demonstrates good return for bed mobility, sit to stands and transfers without use of an AD, able to ambulate in hallway with slower than normal cadence without loss of balance while on 6 LPM with O2 saturation between 92-95%, once returned to room O2 saturation dropped to 88-89% while seated, put patient on 7 LPM with O2 saturation increasing to 91-93% - RN notified.  Plan:  Patient  discharged from physical therapy to care of nursing for ambulation daily as tolerated for length of stay.    Follow Up Recommendations Home health PT;Supervision - Intermittent    Equipment Recommendations  None recommended by PT    Recommendations for Other Services       Precautions / Restrictions Precautions Precautions: None Restrictions Weight Bearing Restrictions: No      Mobility  Bed Mobility Overal bed mobility: Modified Independent             General bed mobility comments: increased time  Transfers Overall transfer level: Modified independent               General transfer comment: increased time  Ambulation/Gait Ambulation/Gait assistance: Supervision Gait Distance (Feet): 100 Feet Assistive device: IV Pole Gait Pattern/deviations: Decreased step length - right;Decreased step length - left;Decreased stride length;WFL(Within Functional Limits) Gait velocity: decreased   General Gait Details: grossly WFL except slow slightly labored cadence while pushing IV pole, no loss of balance, on 6 LPM with O2 saturation between 92-95%  Stairs            Wheelchair Mobility    Modified Rankin (Stroke Patients Only)       Balance Overall balance assessment: No apparent balance deficits (not formally assessed)                                           Pertinent Vitals/Pain Pain Assessment: 0-10 Pain Score: 4  Pain Location: neuropathy pain BLE, chest/bilateral side discomfort due to coughing Pain Descriptors / Indicators: Discomfort Pain Intervention(s): Limited activity within patient's tolerance;Monitored during session  Home Living Family/patient expects to be discharged to:: Private residence Living Arrangements: Spouse/significant other Available Help at Discharge: Family;Available PRN/intermittently Type of Home: House Home Access: Stairs to enter Entrance Stairs-Rails: Psychiatric nurse of Steps:  3 Home Layout: One level Home Equipment: Transport planner;Shower seat      Prior Function Level of Independence: Independent         Comments: household and short distanced community ambulator, 3 LPM O2 dependent     Hand Dominance   Dominant Hand: Right    Extremity/Trunk Assessment   Upper Extremity Assessment Upper Extremity Assessment: Overall WFL for tasks assessed    Lower Extremity Assessment Lower Extremity Assessment: Generalized weakness    Cervical / Trunk Assessment Cervical / Trunk Assessment: Normal  Communication   Communication: No difficulties  Cognition Arousal/Alertness: Awake/alert Behavior During Therapy: WFL for tasks assessed/performed Overall Cognitive Status: Within Functional Limits for tasks assessed                                        General Comments      Exercises     Assessment/Plan    PT Assessment All further PT needs can be met in the next venue of care  PT Problem List Decreased mobility;Decreased activity tolerance       PT Treatment Interventions      PT Goals (Current goals can be found in the Care Plan section)  Acute Rehab PT Goals Patient Stated Goal: return home with family to assist PT Goal Formulation: With patient Time For Goal Achievement: 12/29/18 Potential to Achieve Goals: Good    Frequency     Barriers to discharge        Co-evaluation               AM-PAC PT "6 Clicks" Mobility  Outcome Measure Help needed turning from your back to your side while in a flat bed without using bedrails?: None Help needed moving from lying on your back to sitting on the side of a flat bed without using bedrails?: None Help needed moving to and from a bed to a chair (including a wheelchair)?: None Help needed standing up from a chair using your arms (e.g., wheelchair or bedside chair)?: None Help needed to walk in hospital room?: None Help needed climbing 3-5 steps with a railing? : A  Little 6 Click Score: 23    End of Session Equipment Utilized During Treatment: Oxygen Activity Tolerance: Patient tolerated treatment well;Patient limited by fatigue Patient left: in bed;with call bell/phone within reach(seated at bedside) Nurse Communication: Mobility status PT Visit Diagnosis: Unsteadiness on feet (R26.81);Other abnormalities of gait and mobility (R26.89);Muscle weakness (generalized) (M62.81)    Time: 5102-5852 PT Time Calculation (min) (ACUTE ONLY): 23 min   Charges:   PT Evaluation $PT Eval Moderate Complexity: 1 Mod PT Treatments $Gait Training: 8-22 mins        12:30 PM, 12/29/18 Lonell Grandchild, MPT Physical Therapist with [facility] [phone] office [phone] mobile phone

## 2018-12-29 NOTE — Progress Notes (Signed)
Inpatient Diabetes Program Recommendations  AACE/ADA: New Consensus Statement on Inpatient Glycemic Control (2015)  Target Ranges:  Prepandial:   less than 140 mg/dL      Peak postprandial:   less than 180 mg/dL (1-2 hours)      Critically ill patients:  140 - 180 mg/dL   Lab Results  Component Value Date   GLUCAP 291 (H) 12/29/2018   HGBA1C 6.6 (H) 12/28/2018    RResults for [name], [name] (MRN [id number]) as of 12/29/2018 09:31  Ref. Range 12/28/2018 12:00 12/28/2018 16:21 12/28/2018 21:36 12/29/2018 07:41  Glucose-Capillary Latest Ref Range: 70 - 99 mg/dL 291 (H) 916 (H) 606 (H) 291 (H)  eview of Glycemic Control  Diabetes history: DM2 Outpatient Diabetes medications: Basaglar 25 units + Humalog 15 units tid meal coverage  Current orders for Inpatient glycemic control: Lantus 20 units + Novolog resistant correction scale tid  Inpatient Diabetes Program Recommendations:   -Increase Lantus to 25 units -Add Novolog 10 units tid meal coverage if eats 50% Secure chat to Dr. Gwenlyn Perking with recommendations.  Thank you, Mary [name]. Emmelia Holdsworth, RN, MSN, CDE  Diabetes Coordinator Inpatient Glycemic Control Team Team Pager [phone] (8am-5pm) 12/29/2018 9:34 AM

## 2018-12-29 NOTE — Progress Notes (Signed)
OT Cancellation Note  Patient Details Name: Mary [name] MRN: [id number] DOB: 11/19/1976   Cancelled Treatment:    Reason Eval/Treat Not Completed: OT screened, no needs identified, will sign off. Pt is at baseline for ADL completion, primarily limited by SOB. Pt is familiar with energy conservation strategies and employs at home to limit fatigue. No further OT services required at this time.    Ezra SitesLeslie Troxler, OTR/L  [phone] 12/29/2018, 7:52 AM

## 2018-12-29 NOTE — Progress Notes (Signed)
Initial Nutrition Assessment  DOCUMENTATION CODES:   Obesity unspecified  INTERVENTION:  Provided education: Weight loss tips-Handout  ProStat 30 ml BID (each 30 ml provides 100 kcal, 15 gr protein)   Heart Healthy/CHO modified diet   NUTRITION DIAGNOSIS:   Increased nutrient needs related to acute illness, chronic illness(Influenza A, COPD (HFNC), cirrhosis) as evidenced by estimated needs(based on chronic disease burden).  GOAL:   Patient will meet greater than or equal to 90% of their needs MONITOR:   PO intake, Weight trends, Labs  REASON FOR ASSESSMENT:   Consult Assessment of nutrition requirement/status  ASSESSMENT: Patient is  A 43 yo female with a hx of CHF, COPD, DM-2 (A1C-6.6%),  Alcoholism, pulmonary hypertension, cirrhosis. Patient presents positive for influenza A. Requiring HFNC     Meal intake: patients tray is here from breakfast and lunch 100% of every item consumed. At home she follows a diabetic diet- but says her blood sugars are always higher when she is needing steroid treatment. Breakfast is fruit, lunch -mostly leftovers from previous evening and dinner is usually hot meal she prepares.   Her weight is down around 10-11 lbs with this acute illness based on pt reported usual of 194 lb. Usual range 89-91 kg.   Medications reviewed and include: lipitor, pulmicort, doxycycline, SSI, lantus, methylprednisone, protonix.   Labs: hyperglycemia , hyponatremia  BMP Latest Ref Rng & Units 12/29/2018 12/28/2018 12/26/2018  Glucose 70 - 99 mg/dL 646(O) 032(Z) 224(M)  BUN 6 - 20 mg/dL 13 8 6   Creatinine 0.44 - 1.00 mg/dL 2.50 0.37 0.48  Sodium 135 - 145 mmol/L 132(L) 133(L) 135  Potassium 3.5 - 5.1 mmol/L 4.3 3.6 3.6  Chloride 98 - 111 mmol/L 91(L) 89(L) 92(L)  CO2 22 - 32 mmol/L 32 31 32  Calcium 8.9 - 10.3 mg/dL 8.8(B) 1.6(X) 9.3     NUTRITION - FOCUSED PHYSICAL EXAM: moderate temporal muscle loss   Diet Order:   Diet Order            Diet heart  healthy/carb modified Room service appropriate? Yes; Fluid consistency: Thin  Diet effective now              EDUCATION NEEDS:   Skin:  Skin Assessment: Reviewed RN Assessment(-MASD)  Last BM:  1/23   Height:   Ht Readings from Last 1 Encounters:  12/28/18 5\' 3"  (1.6 m)    Weight:   Wt Readings from Last 1 Encounters:  12/28/18 84 kg    Ideal Body Weight:  52 kg  BMI:  Body mass index is 32.8 kg/m.  Estimated Nutritional Needs:   Kcal:  4503-8882 (20-22 kcal/kg/bw)   Protein:  100-109 gr (~2gr/kg/ibw)  Fluid:  1.7-1.9 liters daily   Royann Shivers MS,RD,CSG,LDN Office: [phone] Pager: [phone]

## 2018-12-30 LAB — HIV ANTIBODY (ROUTINE TESTING W REFLEX): HIV Screen 4th Generation wRfx: NONREACTIVE

## 2018-12-30 LAB — GLUCOSE, CAPILLARY
Glucose-Capillary: 346 mg/dL — ABNORMAL HIGH (ref 70–99)
Glucose-Capillary: 312 mg/dL — ABNORMAL HIGH (ref 70–99)
Glucose-Capillary: 220 mg/dL — ABNORMAL HIGH (ref 70–99)
Glucose-Capillary: 334 mg/dL — ABNORMAL HIGH (ref 70–99)

## 2018-12-30 MED ORDER — TORSEMIDE 20 MG PO TABS
20.0000 mg | ORAL_TABLET | Freq: Every day | ORAL | Status: DC
  Administered 2018-12-30 – 2019-01-02 (×4): 20 mg via ORAL
  Filled 2018-12-30 (×4): qty 1

## 2018-12-30 MED ORDER — HYDROCODONE-HOMATROPINE 5-1.5 MG/5ML PO SYRP
5.0000 mL | ORAL_SOLUTION | Freq: Four times a day (QID) | ORAL | Status: DC | PRN
  Administered 2018-12-30 – 2019-01-02 (×10): 5 mL via ORAL
  Filled 2018-12-30 (×10): qty 5

## 2018-12-30 MED ORDER — SALINE SPRAY 0.65 % NA SOLN
1.0000 | NASAL | Status: DC | PRN
  Administered 2018-12-31: 1 via NASAL
  Filled 2018-12-30: qty 44

## 2018-12-30 NOTE — Progress Notes (Signed)
PROGRESS NOTE    Mary [name]  QQV:[id number] DOB: [birth year] DOA: 12/28/2018 PCP: Corwin Levins, MD     Brief Narrative:  43 y.o. female with a past medical history significant for chronic respiratory failure due to COPD (patient was on 3 L nasal cannula at baseline), chronic diastolic heart failure, type 2 diabetes mellitus with neuropathy, gastroesophageal reflux disease, obesity, obstructive sleep apnea, obesity hypoventilation syndrome, pulmonary hypertension and migraine; who presented to the emergency department secondary to worsening shortness of breath, sweating, general malaise and desaturations.  Patient was previously seen approximately 48 hours in the emergency department secondary to upper respiratory infection symptoms, a pontine she was diagnosed with acute on chronic bronchitis with mild COPD exacerbation and discharged home with instructions to complete treatment with Zithromax and steroids.  Patient did not have the financial funds to get medications and was hoping to get them later today when her husband get paid. She reported having worsening in her resp status, experiencing chills and sweats and despite the use of nebulizer couldn't catch her breath. Patient expressed ongoing productive cough (brownish-yellowish in appearance) and had decrease in oral intake. EMS was called due to severity in her symptoms and found her with O2 sat in mid 60's while using 3L.  Patient admitted with acute on chronic respiratory failure secondary to influenza infection, bronchiectasis and COPD exacerbation.   Assessment & Plan: 1-Acute on chronic respiratory failure with hypoxemia (HCC) -In the setting of influenza a infection and COPD exacerbation/bronchiectasis -Slowly improving; still with diffuse respiratory wheezing and having difficulty speaking in full sentences. -Requiring 6 L nasal cannula supplementation (chronically on 3 L nasal cannula). -Will continue the use of Tamiflu,  antibiotics, nebulizer treatments, IV steroids, flutter valve and as needed use of Hycodan.    2-OHS, OSA on BiPAP -Continue BiPAP nightly -Weight loss recommended.  3-Influenza A -Treatment as mentioned above, continue Tamiflu and supportive care.  4-Esophageal reflux -Continue PPI.   5-PAH (pulmonary artery hypertension) (HCC) -Weight loss recommended -Continue the use of BiPAP -Diuretics with anticipation to resume in a.m. -Follow daily weight and strict I's and O's.  6-Chronic diastolic heart failure (HCC) -Compensated -Fluids discontinued -Low-sodium diet has been instructed -Follow daily weights and strict I's and O's -Plan to resume diuretics today  7-Diabetes mellitus type 2 in obese (HCC) -CBG slightly worse in the setting of his steroids use -Will continue Lantus (increasing to 25 units), continue resistant sliding scale insulin and initiate 3 times daily meal coverage using 10 units of NovoLog if the patient has eaten more than 50% of her trays.  8-thrombocytopenia (HCC) -Chronic and stable -Follow trend -No signs of acute bleeding.  9-Migraine -Continue as needed pain medication -Denies headaches at this moment.  10-Neuropathy -Continue the use of Lyrica and gabapentin  11-constipation -Will use as needed MiraLAX.   DVT prophylaxis: Heparin Code Status: Full code Family Communication: No family at bedside. Disposition Plan: Remains inpatient, continue Tamiflu, continue nebulizer, IV steroids, antibiotics, oxygen supplementation and the use of flutter valve/antitussives.  Follow clinical response.   Consultants:   None  Procedures:   See below for x-ray reports.  Antimicrobials:  Anti-infectives (From admission, onward)   Start     Dose/Rate Route Frequency Ordered Stop   12/28/18 1000  doxycycline (VIBRA-TABS) tablet 100 mg     100 mg Oral Every 12 hours 12/28/18 0942     12/28/18 1000  oseltamivir (TAMIFLU) capsule 75 mg     75 mg Oral 2  times  daily 12/28/18 0942 01/02/19 0959   12/28/18 0930  oseltamivir (TAMIFLU) capsule 75 mg  Status:  Discontinued     75 mg Oral  Once 12/28/18 [id number] 12/28/18 1159      Subjective: Activity is still very limited due to shortness of breath, continued to have difficulty speaking in full sentences, diffuse expiratory wheezing.  No nausea, no vomiting, no abdominal pain.  Patient reports constipation.  Objective: Vitals:   12/29/18 2211 12/30/18 0308 12/30/18 0700 12/30/18 0905  BP:      Pulse:  (!) 58    Resp:  20    Temp:   98.8 F (37.1 C)   TempSrc:   Oral   SpO2: 95% 96%  96%  Weight:   83.8 kg   Height:        Intake/Output Summary (Last 24 hours) at 12/30/2018 1500 Last data filed at 12/30/2018 1000 Gross per 24 hour  Intake 600 ml  Output -  Net 600 ml   Filed Weights   12/28/18 0657 12/28/18 1156 12/30/18 0700  Weight: 88.5 kg 84 kg 83.8 kg    Examination: General exam: Alert, awake, oriented x 3; still short of breath with minimal exertion, having diffuse expiratory wheezing and experiencing some difficulty speaking in full sentences.  Patient is wearing 5 L nasal cannula supplementation to maintain O2 sats and continue using BiPAP nightly as prior to admission. Respiratory system: No using accessory muscles, diffuse expiratory wheezing, positive rhonchi, no crackles. Cardiovascular system:RRR. No murmurs, rubs, gallops. Gastrointestinal system: Abdomen is nondistended, soft and nontender. No organomegaly or masses felt. Normal bowel sounds heard. Central nervous system: Alert and oriented. No focal neurological deficits. Extremities: No C/C/E, +pedal pulses Skin: No rashes, lesions or ulcers Psychiatry: Judgement and insight appear normal. Mood & affect appropriate.    Data Reviewed: I have personally reviewed following labs and imaging studies  CBC: Recent Labs  Lab 12/26/18 1703 12/28/18 0757 12/29/18 0443  WBC 6.5 7.6 5.7  NEUTROABS 4.7 6.3  --   HGB  14.2 13.3 12.5  HCT 45.7 43.6 40.8  MCV 101.8* 100.9* 100.5*  PLT 116* 100* 94*   Basic Metabolic Panel: Recent Labs  Lab 12/26/18 1703 12/28/18 0757 12/29/18 0443  NA 135 133* 132*  K 3.6 3.6 4.3  CL 92* 89* 91*  CO2 32 31 32  GLUCOSE 128* 159* 295*  BUN 6 8 13   CREATININE 0.46 0.59 0.52  CALCIUM 9.3 8.7* 8.7*   GFR: Estimated Creatinine Clearance: 95 mL/min (by C-G formula based on SCr of 0.52 mg/dL).   Liver Function Tests: Recent Labs  Lab 12/28/18 0757  AST 82*  ALT 26  ALKPHOS 97  BILITOT 3.2*  PROT 8.5*  ALBUMIN 4.0   Cardiac Enzymes: Recent Labs  Lab 12/28/18 0757  TROPONINI <0.03   HbA1C: Recent Labs    12/28/18 0757  HGBA1C 6.6*   CBG: Recent Labs  Lab 12/29/18 1136 12/29/18 1657 12/29/18 2340 12/30/18 0819 12/30/18 1155  GLUCAP 253* 310* 333* 346* 312*   Urine analysis:    Component Value Date/Time   COLORURINE AMBER (A) 12/08/2018 2051   APPEARANCEUR HAZY (A) 12/08/2018 2051   LABSPEC 1.028 12/08/2018 2051   PHURINE 7.0 12/08/2018 2051   GLUCOSEU >=500 (A) 12/08/2018 2051   GLUCOSEU 100 (A) 02/24/2018 1644   HGBUR NEGATIVE 12/08/2018 2051   BILIRUBINUR SMALL (A) 12/08/2018 2051   KETONESUR NEGATIVE 12/08/2018 2051   PROTEINUR NEGATIVE 12/08/2018 2051   UROBILINOGEN 2.0 (A) 02/24/2018 1644  NITRITE NEGATIVE 12/08/2018 2051   LEUKOCYTESUR NEGATIVE 12/08/2018 2051    Recent Results (from the past 240 hour(s))  MRSA PCR Screening     Status: None   Collection Time: 12/28/18 11:57 AM  Result Value Ref Range Status   MRSA by PCR NEGATIVE NEGATIVE Final    Comment:        The GeneXpert MRSA Assay (FDA approved for NASAL specimens only), is one component of a comprehensive MRSA colonization surveillance program. It is not intended to diagnose MRSA infection nor to guide or monitor treatment for MRSA infections. Performed at [facility], [address] [place], [place] [address]      Radiology Studies: No results  found.  Scheduled Meds: . arformoterol  15 mcg Nebulization BID  . atorvastatin  40 mg Oral q1800  . budesonide (PULMICORT) nebulizer solution  0.5 mg Nebulization BID  . doxycycline  100 mg Oral Q12H  . feeding supplement (PRO-STAT SUGAR FREE 64)  30 mL Oral BID  . gabapentin  600 mg Oral TID  . heparin injection (subcutaneous)  5,000 Units Subcutaneous Q8H  . insulin aspart  0-20 Units Subcutaneous TID WC  . insulin aspart  10 Units Subcutaneous TID WC  . insulin glargine  25 Units Subcutaneous QHS  . ipratropium-albuterol  3 mL Nebulization Q6H  . mouth rinse  15 mL Mouth Rinse BID  . methylPREDNISolone (SOLU-MEDROL) injection  60 mg Intravenous Q8H  . oseltamivir  75 mg Oral BID  . pantoprazole  40 mg Oral BID  . pregabalin  50 mg Oral BID  . sodium chloride flush  3 mL Intravenous Q12H   Continuous Infusions: . sodium chloride       LOS: 2 days    Time spent: 35 minutes.   Vassie Loll, MD [facility] Pager [phone]   12/30/2018, 3:00 PM

## 2018-12-31 LAB — GLUCOSE, CAPILLARY
Glucose-Capillary: 388 mg/dL — ABNORMAL HIGH (ref 70–99)
Glucose-Capillary: 325 mg/dL — ABNORMAL HIGH (ref 70–99)
Glucose-Capillary: 306 mg/dL — ABNORMAL HIGH (ref 70–99)
Glucose-Capillary: 402 mg/dL — ABNORMAL HIGH (ref 70–99)

## 2018-12-31 MED ORDER — INSULIN ASPART 100 UNIT/ML ~~LOC~~ SOLN
7.0000 [IU] | Freq: Once | SUBCUTANEOUS | Status: AC
  Administered 2018-12-31: 7 [IU] via SUBCUTANEOUS

## 2018-12-31 MED ORDER — BUDESONIDE 0.25 MG/2ML IN SUSP
RESPIRATORY_TRACT | Status: AC
  Administered 2018-12-31: 0.5 mg
  Filled 2018-12-31: qty 4

## 2018-12-31 MED ORDER — METHYLPREDNISOLONE SODIUM SUCC 40 MG IJ SOLR
40.0000 mg | Freq: Three times a day (TID) | INTRAMUSCULAR | Status: DC
  Administered 2018-12-31 – 2019-01-02 (×5): 40 mg via INTRAVENOUS
  Filled 2018-12-31 (×5): qty 1

## 2018-12-31 NOTE — Progress Notes (Signed)
PROGRESS NOTE    Mary [name]  TMH:[id number] DOB: 05-11-1976 DOA: 12/28/2018 PCP: Corwin Levins, MD     Brief Narrative:  43 y.o. female with a past medical history significant for chronic respiratory failure due to COPD (patient was on 3 L nasal cannula at baseline), chronic diastolic heart failure, type 2 diabetes mellitus with neuropathy, gastroesophageal reflux disease, obesity, obstructive sleep apnea, obesity hypoventilation syndrome, pulmonary hypertension and migraine; who presented to the emergency department secondary to worsening shortness of breath, sweating, general malaise and desaturations.  Patient was previously seen approximately 48 hours in the emergency department secondary to upper respiratory infection symptoms, a pontine she was diagnosed with acute on chronic bronchitis with mild COPD exacerbation and discharged home with instructions to complete treatment with Zithromax and steroids.  Patient did not have the financial funds to get medications and was hoping to get them later today when her husband get paid. She reported having worsening in her resp status, experiencing chills and sweats and despite the use of nebulizer couldn't catch her breath. Patient expressed ongoing productive cough (brownish-yellowish in appearance) and had decrease in oral intake. EMS was called due to severity in her symptoms and found her with O2 sat in mid 60's while using 3L.  Patient admitted with acute on chronic respiratory failure secondary to influenza infection, bronchiectasis and COPD exacerbation.   Assessment & Plan: 1-Acute on chronic respiratory failure with hypoxemia (HCC) -In the setting of influenza a infection and COPD exacerbation/bronchiectasis -Slowly improving; still with diffuse respiratory wheezing and having difficulty speaking in full sentences. -Requiring 6 L nasal cannula supplementation (chronically on 3 L nasal cannula). -Will continue the use of Tamiflu,  antibiotics, nebulizer treatments, IV steroids (starting a slow tapering), flutter valve and as needed use of Hycodan.    2-OHS, OSA on BiPAP -Continue BiPAP nightly -Weight loss recommended.  3-Influenza A -Treatment as mentioned above, continue Tamiflu and supportive care.  4-Esophageal reflux -Continue PPI.   5-PAH (pulmonary artery hypertension) (HCC) -Weight loss recommended -Continue the use of BiPAP -Diuretics with anticipation to resume in a.m. -Follow daily weight and strict I's and O's.  6-Chronic diastolic heart failure (HCC) -Compensated -Fluids discontinued -Low-sodium diet has been instructed -Follow daily weights and strict I's and O's -Plan to resume diuretics today  7-Diabetes mellitus type 2 in obese (HCC) -CBG slightly worse in the setting of his steroids use -Will continue Lantus (increasing to 25 units), continue resistant sliding scale insulin and initiate 3 times daily meal coverage using 10 units of NovoLog if the patient has eaten more than 50% of her trays.  8-thrombocytopenia (HCC) -Chronic and stable -Follow trend -No signs of acute bleeding.  9-Migraine -Continue as needed pain medication -Denies headaches at this moment.  10-Neuropathy -Continue the use of Lyrica and gabapentin  11-constipation -Will use as needed MiraLAX.   DVT prophylaxis: Heparin Code Status: Full code Family Communication: No family at bedside. Disposition Plan: Remains inpatient, continue Tamiflu, continue nebulizer, IV steroids, antibiotics, oxygen supplementation and the use of flutter valve/antitussives.  Follow clinical response.    Consultants:   None  Procedures:   See below for x-ray reports.  Antimicrobials:  Anti-infectives (From admission, onward)   Start     Dose/Rate Route Frequency Ordered Stop   12/28/18 1000  doxycycline (VIBRA-TABS) tablet 100 mg     100 mg Oral Every 12 hours 12/28/18 0942     12/28/18 1000  oseltamivir (TAMIFLU)  capsule 75 mg  75 mg Oral 2 times daily 12/28/18 0942 01/02/19 0959   12/28/18 0930  oseltamivir (TAMIFLU) capsule 75 mg  Status:  Discontinued     75 mg Oral  Once 12/28/18 0916 12/28/18 1159      Subjective: Patient reports having a small bowel movement this morning.  Continue to have shortness of breath difficulty speaking in full sentences.  Requiring 6 L high flow nasal cannula oxygen supplementation.  No nausea, no vomiting.  Objective: Vitals:   12/31/18 1054 12/31/18 1100 12/31/18 1200 12/31/18 1503  BP:      Pulse:  87 92   Resp:      Temp:      TempSrc:      SpO2: 92% 97% 90% 93%  Weight:      Height:        Intake/Output Summary (Last 24 hours) at 12/31/2018 1522 Last data filed at 12/31/2018 1200 Gross per 24 hour  Intake 1200 ml  Output -  Net 1200 ml   Filed Weights   12/28/18 1156 12/30/18 0700 12/31/18 0411  Weight: 84 kg 83.8 kg 86.3 kg    Examination: General exam: Alert, awake, oriented x 3; still having difficulty speaking in full sentences, experiences shortness of breath with minimal exertion and requiring 6 L high flow nasal cannula oxygen supplementation. Respiratory system: Decreased air movement, positive expiratory wheezing, positive rhonchi right, no using accessory muscles.   Cardiovascular system: RRR. No murmurs, rubs, gallops. Gastrointestinal system: Abdomen is nondistended, soft and nontender. No organomegaly or masses felt. Normal bowel sounds heard. Central nervous system: Alert and oriented. No focal neurological deficits. Extremities: No C/C/E, +pedal pulses Skin: No rashes, lesions or ulcers Psychiatry: Judgement and insight appear normal. Mood & affect appropriate.    Data Reviewed: I have personally reviewed following labs and imaging studies  CBC: Recent Labs  Lab 12/26/18 1703 12/28/18 0757 12/29/18 0443  WBC 6.5 7.6 5.7  NEUTROABS 4.7 6.3  --   HGB 14.2 13.3 12.5  HCT 45.7 43.6 40.8  MCV 101.8* 100.9* 100.5*  PLT  116* 100* 94*   Basic Metabolic Panel: Recent Labs  Lab 12/26/18 1703 12/28/18 0757 12/29/18 0443  NA 135 133* 132*  K 3.6 3.6 4.3  CL 92* 89* 91*  CO2 32 31 32  GLUCOSE 128* 159* 295*  BUN 6 8 13   CREATININE 0.46 0.59 0.52  CALCIUM 9.3 8.7* 8.7*   GFR: Estimated Creatinine Clearance: 96.4 mL/min (by C-G formula based on SCr of 0.52 mg/dL).   Liver Function Tests: Recent Labs  Lab 12/28/18 0757  AST 82*  ALT 26  ALKPHOS 97  BILITOT 3.2*  PROT 8.5*  ALBUMIN 4.0   Cardiac Enzymes: Recent Labs  Lab 12/28/18 0757  TROPONINI <0.03   CBG: Recent Labs  Lab 12/30/18 1155 12/30/18 1631 12/30/18 2109 12/31/18 0754 12/31/18 1153  GLUCAP 312* 220* 334* 388* 325*   Urine analysis:    Component Value Date/Time   COLORURINE AMBER (A) 12/08/2018 2051   APPEARANCEUR HAZY (A) 12/08/2018 2051   LABSPEC 1.028 12/08/2018 2051   PHURINE 7.0 12/08/2018 2051   GLUCOSEU >=500 (A) 12/08/2018 2051   GLUCOSEU 100 (A) 02/24/2018 1644   HGBUR NEGATIVE 12/08/2018 2051   BILIRUBINUR SMALL (A) 12/08/2018 2051   KETONESUR NEGATIVE 12/08/2018 2051   PROTEINUR NEGATIVE 12/08/2018 2051   UROBILINOGEN 2.0 (A) 02/24/2018 1644   NITRITE NEGATIVE 12/08/2018 2051   LEUKOCYTESUR NEGATIVE 12/08/2018 2051    Recent Results (from the past 240 hour(s))  MRSA PCR Screening     Status: None   Collection Time: 12/28/18 11:57 AM  Result Value Ref Range Status   MRSA by PCR NEGATIVE NEGATIVE Final    Comment:        The GeneXpert MRSA Assay (FDA approved for NASAL specimens only), is one component of a comprehensive MRSA colonization surveillance program. It is not intended to diagnose MRSA infection nor to guide or monitor treatment for MRSA infections. Performed at [facility], [address] [place], [place] [address]      Radiology Studies: No results found.  Scheduled Meds: . arformoterol  15 mcg Nebulization BID  . atorvastatin  40 mg Oral q1800  . budesonide (PULMICORT)  nebulizer solution  0.5 mg Nebulization BID  . doxycycline  100 mg Oral Q12H  . feeding supplement (PRO-STAT SUGAR FREE 64)  30 mL Oral BID  . gabapentin  600 mg Oral TID  . heparin injection (subcutaneous)  5,000 Units Subcutaneous Q8H  . insulin aspart  0-20 Units Subcutaneous TID WC  . insulin aspart  10 Units Subcutaneous TID WC  . insulin glargine  25 Units Subcutaneous QHS  . ipratropium-albuterol  3 mL Nebulization Q6H  . mouth rinse  15 mL Mouth Rinse BID  . methylPREDNISolone (SOLU-MEDROL) injection  40 mg Intravenous Q8H  . oseltamivir  75 mg Oral BID  . pantoprazole  40 mg Oral BID  . pregabalin  50 mg Oral BID  . sodium chloride flush  3 mL Intravenous Q12H  . torsemide  20 mg Oral Daily   Continuous Infusions: . sodium chloride       LOS: 3 days    Time spent: 30 minutes.   Vassie Lollarlos Cedricka Sackrider, MD [facility] Pager [phone]   12/31/2018, 3:22 PM

## 2019-01-01 LAB — GLUCOSE, CAPILLARY
GLUCOSE-CAPILLARY: 296 mg/dL — AB (ref 70–99)
Glucose-Capillary: 410 mg/dL — ABNORMAL HIGH (ref 70–99)
Glucose-Capillary: 196 mg/dL — ABNORMAL HIGH (ref 70–99)
Glucose-Capillary: 356 mg/dL — ABNORMAL HIGH (ref 70–99)

## 2019-01-01 MED ORDER — GUAIFENESIN ER 600 MG PO TB12
600.0000 mg | ORAL_TABLET | Freq: Two times a day (BID) | ORAL | Status: DC
  Administered 2019-01-01 – 2019-01-02 (×3): 600 mg via ORAL
  Filled 2019-01-01 (×3): qty 1

## 2019-01-01 MED ORDER — INSULIN ASPART 100 UNIT/ML ~~LOC~~ SOLN
25.0000 [IU] | Freq: Once | SUBCUTANEOUS | Status: AC
  Administered 2019-01-01: 25 [IU] via SUBCUTANEOUS

## 2019-01-01 MED ORDER — INSULIN ASPART 100 UNIT/ML ~~LOC~~ SOLN
6.0000 [IU] | Freq: Once | SUBCUTANEOUS | Status: AC
  Administered 2019-01-01: 6 [IU] via SUBCUTANEOUS

## 2019-01-01 MED ORDER — INSULIN ASPART 100 UNIT/ML ~~LOC~~ SOLN
15.0000 [IU] | Freq: Three times a day (TID) | SUBCUTANEOUS | Status: DC
  Administered 2019-01-01 – 2019-01-02 (×4): 15 [IU] via SUBCUTANEOUS

## 2019-01-01 MED ORDER — INSULIN GLARGINE 100 UNIT/ML ~~LOC~~ SOLN
30.0000 [IU] | Freq: Every day | SUBCUTANEOUS | Status: DC
  Administered 2019-01-01: 30 [IU] via SUBCUTANEOUS
  Filled 2019-01-01 (×2): qty 0.3

## 2019-01-01 NOTE — Progress Notes (Signed)
Patient eating at 0834, nebulizer treatment postponed. 

## 2019-01-01 NOTE — Progress Notes (Signed)
PROGRESS NOTE    Mary [name]  VVO:[id number] DOB: 07/24/1976 DOA: 12/28/2018 PCP: Corwin Levins, MD     Brief Narrative:  43 y.o. female with a past medical history significant for chronic respiratory failure due to COPD (patient was on 3 L nasal cannula at baseline), chronic diastolic heart failure, type 2 diabetes mellitus with neuropathy, gastroesophageal reflux disease, obesity, obstructive sleep apnea, obesity hypoventilation syndrome, pulmonary hypertension and migraine; who presented to the emergency department secondary to worsening shortness of breath, sweating, general malaise and desaturations.  Patient was previously seen approximately 48 hours in the emergency department secondary to upper respiratory infection symptoms, a pontine she was diagnosed with acute on chronic bronchitis with mild COPD exacerbation and discharged home with instructions to complete treatment with Zithromax and steroids.  Patient did not have the financial funds to get medications and was hoping to get them later today when her husband get paid. She reported having worsening in her resp status, experiencing chills and sweats and despite the use of nebulizer couldn't catch her breath. Patient expressed ongoing productive cough (brownish-yellowish in appearance) and had decrease in oral intake. EMS was called due to severity in her symptoms and found her with O2 sat in mid 60's while using 3L.  Patient admitted with acute on chronic respiratory failure secondary to influenza infection, bronchiectasis and COPD exacerbation.   Assessment & Plan: 1-Acute on chronic respiratory failure with hypoxemia (HCC) -In the setting of influenza a infection and COPD exacerbation/bronchiectasis -Continue to improve slowly; still having diffuse expiratory wheezing, significant shortness of breath and desaturation with minimal exertion and mild difficulty speaking in full sentences. -Requiring 6 L nasal cannula  supplementation (chronically on 3 L nasal cannula). -Will continue the use of Tamiflu, antibiotics, nebulizer treatments, IV steroids (starting a slow tapering), flutter valve and as needed use of Hycodan.    2-OHS, OSA on BiPAP -Continue BiPAP nightly -Weight loss recommended.  3-Influenza A -Treatment as mentioned above, continue Tamiflu and supportive care.  4-Esophageal reflux -Continue PPI.   5-PAH (pulmonary artery hypertension) (HCC) -Weight loss recommended -Continue the use of BiPAP -Diuretics with anticipation to resume in a.m. -Follow daily weight and strict I's and O's.  6-Chronic diastolic heart failure (HCC) -Compensated -Fluids discontinued -Low-sodium diet has been instructed -Follow daily weights and strict I's and O's -Plan to resume diuretics today  7-Diabetes mellitus type 2 in obese (HCC) -CBG slightly worse and up in the setting of his steroids use -Will continue Lantus (increasing to 30 units), continue resistant sliding scale insulin and initiate 3 times daily meal coverage using 15 units of NovoLog if the patient has eaten more than 50% of her trays.  8-thrombocytopenia (HCC) -Chronic and stable -Follow trend -No signs of acute bleeding.  9-Migraine -Continue as needed pain medication -Denies headaches at this moment.  10-Neuropathy -Continue the use of Lyrica and gabapentin  11-constipation -Will use as needed MiraLAX.   DVT prophylaxis: Heparin Code Status: Full code Family Communication: No family at bedside. Disposition Plan: Remains inpatient, continue Tamiflu, continue nebulizer, IV steroids, antibiotics, oxygen supplementation and the use of flutter valve/antitussives.  Follow clinical response.    Consultants:   None  Procedures:   See below for x-ray reports.  Antimicrobials:  Anti-infectives (From admission, onward)   Start     Dose/Rate Route Frequency Ordered Stop   12/28/18 1000  doxycycline (VIBRA-TABS) tablet 100 mg      100 mg Oral Every 12 hours 12/28/18 0942  12/28/18 1000  oseltamivir (TAMIFLU) capsule 75 mg     75 mg Oral 2 times daily 12/28/18 0942 01/02/19 0959   12/28/18 0930  oseltamivir (TAMIFLU) capsule 75 mg  Status:  Discontinued     75 mg Oral  Once 12/28/18 0916 12/28/18 1159      Subjective: Continues to have significant shortness of breath with activity; mild difficulty speaking in full sentences and requiring 6 L high flow nasal cannula.  No chest pain, no nausea, no vomiting.  Reports small hard bowel movement overnight.  Objective: Vitals:   01/01/19 0306 01/01/19 0809 01/01/19 1037 01/01/19 1532  BP:  112/64    Pulse:  78    Resp:  18    Temp:  98.6 F (37 C)    TempSrc:  Oral    SpO2: 92% 91% 98% 97%  Weight:      Height:        Intake/Output Summary (Last 24 hours) at 01/01/2019 1748 Last data filed at 01/01/2019 1700 Gross per 24 hour  Intake 240 ml  Output -  Net 240 ml   Filed Weights   12/28/18 1156 12/30/18 0700 12/31/18 0411  Weight: 84 kg 83.8 kg 86.3 kg    Examination: General exam: Alert, awake, oriented x 3; still short of breath with minimal exertion and desaturating to mid 80s despite high flow oxygen supplementation with activity.  No chest pain, no nausea, no vomiting. Respiratory system: Improved air movement, positive expiratory wheezing, positive rhonchi (diffusely).  Cardiovascular system:RRR. No murmurs, rubs, gallops. Gastrointestinal system: Abdomen is obese, nondistended, soft and nontender. No organomegaly or masses felt. Normal bowel sounds heard. Central nervous system: Alert and oriented. No focal neurological deficits. Extremities: No cyanosis or clubbing; trace edema bilaterally appreciated. Skin: No rashes, lesions or ulcers Psychiatry: Judgement and insight appear normal. Mood & affect appropriate.    Data Reviewed: I have personally reviewed following labs and imaging studies  CBC: Recent Labs  Lab 12/26/18 1703  12/28/18 0757 12/29/18 0443  WBC 6.5 7.6 5.7  NEUTROABS 4.7 6.3  --   HGB 14.2 13.3 12.5  HCT 45.7 43.6 40.8  MCV 101.8* 100.9* 100.5*  PLT 116* 100* 94*   Basic Metabolic Panel: Recent Labs  Lab 12/26/18 1703 12/28/18 0757 12/29/18 0443  NA 135 133* 132*  K 3.6 3.6 4.3  CL 92* 89* 91*  CO2 32 31 32  GLUCOSE 128* 159* 295*  BUN 6 8 13   CREATININE 0.46 0.59 0.52  CALCIUM 9.3 8.7* 8.7*   GFR: Estimated Creatinine Clearance: 96.4 mL/min (by C-G formula based on SCr of 0.52 mg/dL).   Liver Function Tests: Recent Labs  Lab 12/28/18 0757  AST 82*  ALT 26  ALKPHOS 97  BILITOT 3.2*  PROT 8.5*  ALBUMIN 4.0   Cardiac Enzymes: Recent Labs  Lab 12/28/18 0757  TROPONINI <0.03   CBG: Recent Labs  Lab 12/31/18 1700 12/31/18 2121 01/01/19 0807 01/01/19 1125 01/01/19 1632  GLUCAP 306* 402* 410* 296* 196*   Urine analysis:    Component Value Date/Time   COLORURINE AMBER (A) 12/08/2018 2051   APPEARANCEUR HAZY (A) 12/08/2018 2051   LABSPEC 1.028 12/08/2018 2051   PHURINE 7.0 12/08/2018 2051   GLUCOSEU >=500 (A) 12/08/2018 2051   GLUCOSEU 100 (A) 02/24/2018 1644   HGBUR NEGATIVE 12/08/2018 2051   BILIRUBINUR SMALL (A) 12/08/2018 2051   KETONESUR NEGATIVE 12/08/2018 2051   PROTEINUR NEGATIVE 12/08/2018 2051   UROBILINOGEN 2.0 (A) 02/24/2018 1644   NITRITE  NEGATIVE 12/08/2018 2051   LEUKOCYTESUR NEGATIVE 12/08/2018 2051    Recent Results (from the past 240 hour(s))  MRSA PCR Screening     Status: None   Collection Time: 12/28/18 11:57 AM  Result Value Ref Range Status   MRSA by PCR NEGATIVE NEGATIVE Final    Comment:        The GeneXpert MRSA Assay (FDA approved for NASAL specimens only), is one component of a comprehensive MRSA colonization surveillance program. It is not intended to diagnose MRSA infection nor to guide or monitor treatment for MRSA infections. Performed at [facility], [address] [place], [place] [address]      Radiology  Studies: No results found.  Scheduled Meds: . arformoterol  15 mcg Nebulization BID  . atorvastatin  40 mg Oral q1800  . budesonide (PULMICORT) nebulizer solution  0.5 mg Nebulization BID  . doxycycline  100 mg Oral Q12H  . feeding supplement (PRO-STAT SUGAR FREE 64)  30 mL Oral BID  . gabapentin  600 mg Oral TID  . guaiFENesin  600 mg Oral BID  . heparin injection (subcutaneous)  5,000 Units Subcutaneous Q8H  . insulin aspart  0-20 Units Subcutaneous TID WC  . insulin aspart  15 Units Subcutaneous TID WC  . insulin glargine  30 Units Subcutaneous QHS  . ipratropium-albuterol  3 mL Nebulization Q6H  . mouth rinse  15 mL Mouth Rinse BID  . methylPREDNISolone (SOLU-MEDROL) injection  40 mg Intravenous Q8H  . oseltamivir  75 mg Oral BID  . pantoprazole  40 mg Oral BID  . pregabalin  50 mg Oral BID  . sodium chloride flush  3 mL Intravenous Q12H  . torsemide  20 mg Oral Daily   Continuous Infusions: . sodium chloride       LOS: 4 days    Time spent: 30 minutes.   Vassie Lollarlos Latisia Hilaire, MD [facility] Pager [phone]   01/01/2019, 5:48 PM

## 2019-01-01 NOTE — Progress Notes (Signed)
Inpatient Diabetes Program Recommendations  AACE/ADA: New Consensus Statement on Inpatient Glycemic Control (2015)  Target Ranges:  Prepandial:   less than 140 mg/dL      Peak postprandial:   less than 180 mg/dL (1-2 hours)      Critically ill patients:  140 - 180 mg/dL   Lab Results  Component Value Date   GLUCAP 296 (H) 01/01/2019   HGBA1C 6.6 (H) 12/28/2018    Review of Glycemic Control  On Solumedrol 40 mg Q8H.  Inpatient Diabetes Program Recommendations:     Increase Lantus to 30 units QHS Increase Novolog to 15 units tidwc.  Continue to follow. Watch trends.  Thank you. Ailene Ards, RD, LDN, CDE Inpatient Diabetes Coordinator [phone]

## 2019-01-01 NOTE — Care Management (Addendum)
Patient may need higher oxygen concentrator at home prior to DC.  Verified with [facility] that patient has capacity to go up to 5 liters, has been wearing 2 liters prior to arrival.   Discussed with Dr. Gwenlyn Perking. Patient will most likely DC 1/28. Will need order for higher oxygen concentration.

## 2019-01-02 LAB — GLUCOSE, CAPILLARY
GLUCOSE-CAPILLARY: 238 mg/dL — AB (ref 70–99)
Glucose-Capillary: 336 mg/dL — ABNORMAL HIGH (ref 70–99)
Glucose-Capillary: 309 mg/dL — ABNORMAL HIGH (ref 70–99)

## 2019-01-02 MED ORDER — PREDNISONE 20 MG PO TABS: ORAL_TABLET | ORAL | 0 refills | Status: DC

## 2019-01-02 MED ORDER — DOXYCYCLINE HYCLATE 100 MG PO TABS: 100.0000 mg | ORAL_TABLET | Freq: Two times a day (BID) | ORAL | 0 refills | Status: AC

## 2019-01-02 NOTE — Progress Notes (Signed)
Inpatient Diabetes Program Recommendations  AACE/ADA: New Consensus Statement on Inpatient Glycemic Control   Target Ranges:  Prepandial:   less than 140 mg/dL      Peak postprandial:   less than 180 mg/dL (1-2 hours)      Critically ill patients:  140 - 180 mg/dL   Results for Mary HakimKELLEY, Frederika L (MRN [id number]) as of 01/02/2019 09:59  Ref. Range 01/01/2019 08:07 01/01/2019 11:25 01/01/2019 16:32 01/01/2019 22:32 01/02/2019 07:46  Glucose-Capillary Latest Ref Range: 70 - 99 mg/dL 782410 (H) 956296 (H) 213196 (H) 356 (H) 336 (H)   Review of Glycemic Control  Diabetes history: DM2 Outpatient Diabetes medications: Basaglar 25 units daily, Humalog 15 units TID with meals Current orders for Inpatient glycemic control: Lantus 30 units QHS, Novolog 15 units TID with meals, Novolog 0-20 units TID with meals; Solumedrol 40 mg Q8H  Inpatient Diabetes Program Recommendations:   Insulin - Basal: If steroids are continued, please consider increasing Lantus to 35 units QHS.  Insulin-Meal Coverage: If steroids are continued, please consider increasing meal coverage to Novolog 20 units TID with meals.  Correction (SSI): Please consider ordering Novolog 0-5 units QHS for bedtime correction.  Thanks, Orlando PennerMarie Tuere Nwosu, RN, MSN, CDE Diabetes Coordinator Inpatient Diabetes Program [phone] (Team Pager from 8am to 5pm)

## 2019-01-02 NOTE — Care Management Note (Signed)
Case Management Note  Patient Details  Name: Mary [name] MRN: [id number] Date of Birth: 02/13/1976  Action/Plan:   Expected Discharge Date:     01/02/2019             Expected Discharge Plan:  Home/Self Care  In-House Referral:  NA  Discharge planning Services  CM Consult  Post Acute Care Choice:  Home Health, Durable Medical Equipment Choice offered to:  Patient  HH Arranged:  PT HH Agency:  [facility]  Status of Service:  Completed, signed off  If discussed at Long Length of Stay Meetings, dates discussed:  01/02/2019  Additional Comments: [facility] has received order for high liter flow on oxygen. Pt will have port tank from home she can use for transport that goes to 5 L wish is her current liter flow. Pt aware one can be delivered if she need it. Pt will have a co-pay for [facility] services and says she will refuse when they call. No other CM needs communicated.   Malcolm Metro, RN 01/02/2019, 11:37 AM

## 2019-01-02 NOTE — Discharge Summary (Signed)
Physician Discharge Summary  SPARROW SIRACUSA ZOX:[id number] DOB: 01/22/1976 DOA: 12/28/2018  PCP: Corwin Levins, MD  Admit date: 12/28/2018 Discharge date: 01/02/2019  Time spent: 35 minutes  Recommendations for Outpatient Follow-up:  1. Repeat basic metabolic panel to follow electrolytes and renal function 2. Reassess CBGs and A1c after steroids tapering with further adjustment to hypoglycemic regimen as needed   Discharge Diagnoses:  Principal Problem:   Acute on chronic respiratory failure with hypoxemia (HCC) Active Problems:   OSA on BiPAP   Obesity hypoventilation syndrome (HCC)   Influenza A   Esophageal reflux   PAH (pulmonary artery hypertension) (HCC)   Chronic diastolic heart failure (HCC)   Diabetes mellitus type 2 in obese (HCC)   Thrombocytopenia (HCC)   Steroid-induced diabetes (HCC)   COPD with acute exacerbation (HCC)   Migraine   Neuropathy   Discharge Condition: Stable and improved.  Patient discharged home with instructions to follow-up with PCP in 10 days.  Diet recommendation: Heart healthy, modified carbohydrates and low calorie diet has been advised.       Filed Weights   12/30/18 0700 12/31/18 0411 01/02/19 0500  Weight: 83.8 kg 86.3 kg 86.7 kg    History of present illness:  43 y.o.femalewith a past medical history significant for chronic respiratory failure due to COPD (patient was on 3-3.5 L nasal cannula at baseline), chronic diastolic heart failure, type 2 diabetes mellitus with neuropathy, gastroesophageal reflux disease, obesity, obstructive sleep apnea, obesity hypoventilation syndrome, pulmonary hypertension and migraine; who presented to the emergency department secondary to worsening shortness of breath, sweating, general malaise and desaturations. Patient was previously seen approximately 48 hours in the emergency department secondary to upper respiratory infection symptoms,a pontine she was diagnosed with acute on chronic  bronchitis with mild COPD exacerbation and discharged home with instructions to complete treatment with Zithromax and steroids. Patient did not have the financial funds to get medications and was hoping to get them later today when her husband get paid. She reported having worsening in her resp status, experiencing chills and sweats and despite the use of nebulizer couldn't catch her breath. Patient expressed ongoing productive cough (brownish-yellowish in appearance) and had decrease in oral intake. EMS was called due to severity in her symptoms and found her with O2 sat in mid 60's while using 3L.  Patient admitted with acute on chronic respiratory failure secondary to influenza infection, bronchiectasis and COPD exacerbation.   Hospital Course:  1-Acute on chronic respiratory failure with hypoxemia (HCC) -In the setting of influenza a infection and COPD exacerbation/bronchiectasis -Much improved; currently no wheezing and the patient was able to speak in full sentences.  Still having some shortness of breath on activity and requiring higher oxygen supplementation that are baseline; but feels stable and ready to go home and continue treatment as an outpatient. -At discharge she is using around 5 L nasal cannula supplementation with good O2 sats; patient will continue slowly weaning herself down to the usual 3-3.5 L as tolerated.  Concentrator at [facility] has been exchange by advanced home care to assure stability and capacity to provide proper supplementation at this time. -Patient has completed treatment with Tamiflu -We will discharge home on steroids tapering, antibiotics to complete treatment for bronchiectasis; resumption of Spiriva, continue rescue inhaler/nebulizer using albuterol and resumption of Symbicort. -Encouraged to use as needed Tessalon Perles and continue use and flutter valve.  2-OHS, OSA on BiPAP -Continue BiPAP nightly -Weight loss recommended. -Continue daily oxygen  supplementation.  3-Influenza A -  Overall improved -Patient has completed treatment with Tamiflu during hospitalization. -Continue at this time supportive care and treatment for her COPD exacerbation. -Patient received her flu vaccine at the beginning of flu season.   4-Esophageal reflux -Continue PPI.  5-PAH (pulmonary artery hypertension) (HCC) -Weight loss recommended -Continue the use of BiPAP nightly -Continue diuretics therapy.  6-Chronic diastolic heart failure (HCC) -Compensated and is stable. -Low-sodium diet has been instructed -Follow daily weights and continue the use of Demadex.  7-Diabetes mellitus type 2 in obese (HCC) -CBG's upin the setting of his steroids use -Will resume home hypoglycemic regimen -A1c 6.6 -Advised to follow modified carbohydrate -Anticipate further stabilization of her blood sugar once a steroids taper process completed.  8-thrombocytopenia (HCC) -Chronic and stable -Follow trend -No signs of acute bleeding appreciated during hospitalization.Marland Kitchen  9-Migraine -Continue as needed pain medication -Denies headaches at this moment.  10-Neuropathy -Continue the use of Lyrica and gabapentin  11-constipation -Patient achieve bowel movements prior to discharge with the use of as needed MiraLAX. -Advised to increase fiber in her diet to maintain adequate hydration.  Procedures:  See below for x-ray reports.  Consultations:  None  Discharge Exam:     Vitals:   01/02/19 0736 01/02/19 1348  BP:    Pulse:    Resp:    Temp:    SpO2: 94% 95%   General exam:Alert, awake, oriented x 3;still short of breath with exertion and requiring 5 L of oxygen supplementation.  Able to speak in full sentences and no having significant wheezing today.  Felt ready to go home with outpatient management for her condition and lower titration of oxygen as tolerated.   Respiratory system:Improved air movement, no wheezing, positive  rhonchi, no crackles, normal respiratory effort.   Cardiovascular system:RRR. No murmurs, rubs, gallops. Gastrointestinal system:Abdomen is obese,nondistended, soft and nontender. No organomegaly or masses felt. Normal bowel sounds heard. Central nervous system:Alert and oriented. No focal neurological deficits. Extremities: Nocyanosis or clubbing; trace edema bilaterally appreciated. Skin: No rashes, lesions or ulcers.   Discharge Instructions       Discharge Instructions    Diet - low sodium heart healthy   Complete by:  As directed    Discharge instructions   Complete by:  As directed    Take medications as prescribed Arrange follow-up with PCP in 10 days Continue slowly weaning down oxygen supplementation as tolerated up to 3.5 L as chronically used before Follow heart healthy, modified carbohydrates and low calorie diet. Check weight on daily basis          Allergies as of 01/02/2019      Reactions   Bee Venom Swelling   Pt states she swells up 3x normal size. If stung "2-3 times, my throat could close up"               Medication List        TAKE these medications       albuterol 108 (90 Base) MCG/ACT inhaler Commonly known as:  PROVENTIL HFA;VENTOLIN HFA USE 2 PUFFS EVERY 6 HOURS AS NEEDED FOR WHEEZING   albuterol (2.5 MG/3ML) 0.083% nebulizer solution Commonly known as:  PROVENTIL Take 3 mLs (2.5 mg total) by nebulization every 6 (six) hours as needed for wheezing or shortness of breath.   atorvastatin 40 MG tablet Commonly known as:  LIPITOR Take 1 tablet (40 mg total) by mouth daily.   BASAGLAR KWIKPEN 100 UNIT/ML Sopn Inject 0.25 mLs (25 Units total) into the skin daily at  10 pm. E11.9  Restart after finished with 70/30 insulin   benzonatate 200 MG capsule Commonly known as:  TESSALON Take 1 capsule (200 mg total) by mouth 3 (three) times daily as needed for cough.   budesonide-formoterol 160-4.5 MCG/ACT inhaler Commonly  known as:  SYMBICORT Inhale 2 puffs into the lungs daily.   doxycycline 100 MG tablet Commonly known as:  VIBRA-TABS Take 1 tablet (100 mg total) by mouth every 12 (twelve) hours for 6 doses.   gabapentin 300 MG capsule Commonly known as:  NEURONTIN TAKE 2 CAPSULES (600 MG TOTAL) BY MOUTH 3 (THREE) TIMES DAILY.   glucose blood test strip Commonly known as:  ONE TOUCH ULTRA TEST Use as instructed four times per day E11.9   HUMALOG KWIKPEN 100 UNIT/ML KwikPen Generic drug:  insulin lispro Inject 0.15 mLs (15 Units total) into the skin 3 (three) times daily with meals. Restart after finished with 70/30 insulin   Insulin Pen Needle 32G X 4 MM Misc Commonly known as:  BD PEN NEEDLE NANO U/F Use as directed four times per day daily E11.9   ipratropium-albuterol 0.5-2.5 (3) MG/3ML Soln Commonly known as:  DUONEB Take 3 mLs by nebulization every 4 (four) hours as needed.   pantoprazole 40 MG tablet Commonly known as:  PROTONIX Take 1 tablet (40 mg total) by mouth 2 (two) times daily before a meal.   potassium chloride SA 20 MEQ tablet Commonly known as:  K-DUR,KLOR-CON Take 2 tablets (40 mEq total) by mouth daily.   predniSONE 20 MG tablet Commonly known as:  DELTASONE Take 3 tablets by mouth daily x1 day; then 2 tablets by mouth daily x3 days; then 1 tablet by mouth daily x3 days; then half tablet by mouth daily x3 days and stop prednisone. What changed:    how much to take  how to take this  when to take this  additional instructions   pregabalin 50 MG capsule Commonly known as:  LYRICA Take 1 capsule (50 mg total) by mouth 2 (two) times daily.   Tiotropium Bromide Monohydrate 1.25 MCG/ACT Aers Commonly known as:  SPIRIVA RESPIMAT Inhale 2 puffs into the lungs daily.   torsemide 20 MG tablet Commonly known as:  DEMADEX TAKE 1 TABLET BY MOUTH EVERY DAY   traMADol 50 MG tablet Commonly known as:  ULTRAM Take 1 tablet (50 mg total) by mouth every 6  (six) hours as needed.                        Durable Medical Equipment  (From admission, onward)                        Start     Ordered    01/01/19 1339  For home use only DME oxygen  Once    Question Answer Comment  Mode or (Route) Nasal cannula   Liters per Minute 6   Frequency Continuous (stationary and portable oxygen unit needed)   Oxygen conserving device Yes   Oxygen delivery system Gas      01/01/19 1338                Allergies  Allergen Reactions  . Bee Venom Swelling    Pt states she swells up 3x normal size. If stung "2-3 times, my throat could close up"      Follow-up Information    Corwin LevinsJohn, [name], MD. Schedule an appointment as soon as possible  for a visit in 10 day(s).   Specialties:  Internal Medicine, Radiology Contact information: [address] [facility] [place] [address] [phone]            The results of significant diagnostics from this hospitalization (including imaging, microbiology, ancillary and laboratory) are listed below for reference.    Significant Diagnostic Studies:  ImagingResults  Dg Chest 2 View  Result Date: 12/26/2018 CLINICAL DATA:  Productive cough and sore throat since this morning EXAM: CHEST - 2 VIEW COMPARISON:  12/08/2018 FINDINGS: Mild airway thickening bandlike density at the left lung base compatible with scarring or atelectasis. No appreciable consolidation. No blunting of the costophrenic angles. Heart size within normal limits. IMPRESSION: 1. Airway thickening is present, suggesting bronchitis or reactive airways disease. 2. Atelectasis versus scarring at the left lung base. Electronically Signed   By: Gaylyn RongWalter  Liebkemann M.D.   On: 12/26/2018 18:56   Dg Chest Portable 1 View  Result Date: 12/28/2018 CLINICAL DATA:  Shortness of breath and hypoxia EXAM: PORTABLE CHEST 1 VIEW COMPARISON:  Two days ago FINDINGS: Hyperinflation and chronic interstitial coarsening.  There is no edema, consolidation, effusion, or pneumothorax. Normal heart size and mediastinal contours. IMPRESSION: COPD without acute superimposed finding. Electronically Signed   By: Marnee SpringJonathon  Watts M.D.   On: 12/28/2018 07:47   Dg Chest Port 1 View  Result Date: 12/08/2018 CLINICAL DATA:  Dizziness and nausea over the last 3 days with shortness of breath. EXAM: PORTABLE CHEST 1 VIEW COMPARISON:  09/12/2018 FINDINGS: Heart size is normal. Mild chronic interstitial markings. Prominent pericardial fat on the left is a chronic finding. No evidence of acute consolidation, collapse, edema or effusion. IMPRESSION: No active disease. Chronic interstitial markings. Electronically Signed   By: Paulina FusiMark  Shogry M.D.   On: 12/08/2018 21:07     Microbiology:        Recent Results (from the past 240 hour(s))  MRSA PCR Screening     Status: None   Collection Time: 12/28/18 11:57 AM  Result Value Ref Range Status   MRSA by PCR NEGATIVE NEGATIVE Final    Comment:        The GeneXpert MRSA Assay (FDA approved for NASAL specimens only), is one component of a comprehensive MRSA colonization surveillance program. It is not intended to diagnose MRSA infection nor to guide or monitor treatment for MRSA infections. Performed at [facility], [address] [place], [place] [address]      Labs: Basic Metabolic Panel: LastLabs       Recent Labs  Lab 12/26/18 1703 12/28/18 0757 12/29/18 0443  NA 135 133* 132*  K 3.6 3.6 4.3  CL 92* 89* 91*  CO2 32 31 32  GLUCOSE 128* 159* 295*  BUN 6 8 13   CREATININE 0.46 0.59 0.52  CALCIUM 9.3 8.7* 8.7*     Liver Function Tests: LastLabs     Recent Labs  Lab 12/28/18 0757  AST 82*  ALT 26  ALKPHOS 97  BILITOT 3.2*  PROT 8.5*  ALBUMIN 4.0     CBC: LastLabs  Recent Labs  Lab 12/26/18 1703 12/28/18 0757 12/29/18 0443  WBC 6.5 7.6 5.7  NEUTROABS 4.7 6.3  --   HGB 14.2 13.3 12.5  HCT 45.7 43.6 40.8  MCV 101.8* 100.9* 100.5*   PLT 116* 100* 94*     Cardiac Enzymes: LastLabs     Recent Labs  Lab 12/28/18 0757  TROPONINI <0.03     BNP: BNP (last 3 results) RecentLabs(withinlast365days)  Recent Labs    05/12/18 0754 09/12/18 0720 12/28/18 0757  BNP 64.2 44.0 58.0      CBG: LastLabs         Recent Labs  Lab 01/01/19 1632 01/01/19 2232 01/02/19 0746 01/02/19 1134 01/02/19 1619  GLUCAP 196* 356* 336* 309* 238*      Signed:  Vassie Loll MD.  [facility] 01/02/2019, 4:58 PM

## 2019-01-02 NOTE — Progress Notes (Signed)
Patient's IV catheter removed and intact. Pt's IV site clean dry and intact. Discharge instructions including medications and follow up appointments were reviewed and discussed with patient. All questions were answered and no further questions at this time.. Pt in stable condition and in no acute distress at time of discharge. Pt will be escorted by nurse tech.  

## 2019-01-02 NOTE — Progress Notes (Signed)
Physician Discharge Summary  Mary [name] MEQ:[id number] DOB: Aug 18, 1976 DOA: 12/28/2018  PCP: Corwin Levins, MD  Admit date: 12/28/2018 Discharge date: 01/02/2019  Time spent: 35 minutes  Recommendations for Outpatient Follow-up:  1. Repeat basic metabolic panel to follow electrolytes and renal function 2. Reassess CBGs and A1c after steroids tapering with further adjustment to hypoglycemic regimen as needed   Discharge Diagnoses:  Principal Problem:   Acute on chronic respiratory failure with hypoxemia (HCC) Active Problems:   OSA on BiPAP   Obesity hypoventilation syndrome (HCC)   Influenza A   Esophageal reflux   PAH (pulmonary artery hypertension) (HCC)   Chronic diastolic heart failure (HCC)   Diabetes mellitus type 2 in obese (HCC)   Thrombocytopenia (HCC)   Steroid-induced diabetes (HCC)   COPD with acute exacerbation (HCC)   Migraine   Neuropathy   Discharge Condition: Stable and improved.  Patient discharged home with instructions to follow-up with PCP in 10 days.  Diet recommendation: Heart healthy, modified carbohydrates and low calorie diet has been advised.  Filed Weights   12/30/18 0700 12/31/18 0411 01/02/19 0500  Weight: 83.8 kg 86.3 kg 86.7 kg    History of present illness:  43 y.o.femalewith a past medical history significant for chronic respiratory failure due to COPD (patient was on 3-3.5 L nasal cannula at baseline), chronic diastolic heart failure, type 2 diabetes mellitus with neuropathy, gastroesophageal reflux disease, obesity, obstructive sleep apnea, obesity hypoventilation syndrome, pulmonary hypertension and migraine; who presented to the emergency department secondary to worsening shortness of breath, sweating, general malaise and desaturations. Patient was previously seen approximately 48 hours in the emergency department secondary to upper respiratory infection symptoms,a pontine she was diagnosed with acute on chronic bronchitis with  mild COPD exacerbation and discharged home with instructions to complete treatment with Zithromax and steroids. Patient did not have the financial funds to get medications and was hoping to get them later today when her husband get paid. She reported having worsening in her resp status, experiencing chills and sweats and despite the use of nebulizer couldn't catch her breath. Patient expressed ongoing productive cough (brownish-yellowish in appearance) and had decrease in oral intake. EMS was called due to severity in her symptoms and found her with O2 sat in mid 60's while using 3L.  Patient admitted with acute on chronic respiratory failure secondary to influenza infection, bronchiectasis and COPD exacerbation.   Hospital Course:  1-Acute on chronic respiratory failure with hypoxemia (HCC) -In the setting of influenza a infection and COPD exacerbation/bronchiectasis -Much improved; currently no wheezing and the patient was able to speak in full sentences.  Still having some shortness of breath on activity and requiring higher oxygen supplementation that are baseline; but feels stable and ready to go home and continue treatment as an outpatient. -At discharge she is using around 5 L nasal cannula supplementation with good O2 sats; patient will continue slowly weaning herself down to the usual 3-3.5 L as tolerated.  Concentrator at [place] has been exchange by advanced home care to assure stability and capacity to provide proper supplementation at this time. -Patient has completed treatment with Tamiflu -We will discharge home on steroids tapering, antibiotics to complete treatment for bronchiectasis; resumption of Spiriva, continue rescue inhaler/nebulizer using albuterol and resumption of Symbicort. -Encouraged to use as needed Tessalon Perles and continue use and flutter valve.  2-OHS, OSA on BiPAP -Continue BiPAP nightly -Weight loss recommended. -Continue daily oxygen  supplementation.  3-Influenza A -Overall improved -Patient has completed  treatment with Tamiflu during hospitalization. -Continue at this time supportive care and treatment for her COPD exacerbation. -Patient received her flu vaccine at the beginning of flu season.   4-Esophageal reflux -Continue PPI.   5-PAH (pulmonary artery hypertension) (HCC) -Weight loss recommended -Continue the use of BiPAP nightly -Continue diuretics therapy.  6-Chronic diastolic heart failure (HCC) -Compensated and is stable. -Low-sodium diet has been instructed -Follow daily weights and continue the use of Demadex.  7-Diabetes mellitus type 2 in obese (HCC) -CBG's up in the setting of his steroids use -Will resume home hypoglycemic regimen -A1c 6.6 -Advised to follow modified carbohydrate -Anticipate further stabilization of her blood sugar once a steroids taper process completed.  8-thrombocytopenia (HCC) -Chronic and stable -Follow trend -No signs of acute bleeding appreciated during hospitalization.Marland Kitchen  9-Migraine -Continue as needed pain medication -Denies headaches at this moment.  10-Neuropathy -Continue the use of Lyrica and gabapentin  11-constipation -Patient achieve bowel movements prior to discharge with the use of as needed MiraLAX. -Advised to increase fiber in her diet to maintain adequate hydration.  Procedures:  See below for x-ray reports.  Consultations:  None  Discharge Exam: Vitals:   01/02/19 0736 01/02/19 1348  BP:    Pulse:    Resp:    Temp:    SpO2: 94% 95%   General exam: Alert, awake, oriented x 3; still short of breath with exertion and requiring 5 L of oxygen supplementation.  Able to speak in full sentences and no having significant wheezing today.  Felt ready to go home with outpatient management for her condition and lower titration of oxygen as tolerated.   Respiratory system: Improved air movement, no wheezing, positive rhonchi, no  crackles, normal respiratory effort.    Cardiovascular system:RRR. No murmurs, rubs, gallops. Gastrointestinal system: Abdomen is obese, nondistended, soft and nontender. No organomegaly or masses felt. Normal bowel sounds heard. Central nervous system: Alert and oriented. No focal neurological deficits. Extremities: No cyanosis or clubbing; trace edema bilaterally appreciated. Skin: No rashes, lesions or ulcers.   Discharge Instructions   Discharge Instructions    Diet - low sodium heart healthy   Complete by:  As directed    Discharge instructions   Complete by:  As directed    Take medications as prescribed Arrange follow-up with PCP in 10 days Continue slowly weaning down oxygen supplementation as tolerated up to 3.5 L as chronically used before Follow heart healthy, modified carbohydrates and low calorie diet. Check weight on daily basis     Allergies as of 01/02/2019      Reactions   Bee Venom Swelling   Pt states she swells up 3x normal size. If stung "2-3 times, my throat could close up"      Medication List    TAKE these medications   albuterol 108 (90 Base) MCG/ACT inhaler Commonly known as:  PROVENTIL HFA;VENTOLIN HFA USE 2 PUFFS EVERY 6 HOURS AS NEEDED FOR WHEEZING   albuterol (2.5 MG/3ML) 0.083% nebulizer solution Commonly known as:  PROVENTIL Take 3 mLs (2.5 mg total) by nebulization every 6 (six) hours as needed for wheezing or shortness of breath.   atorvastatin 40 MG tablet Commonly known as:  LIPITOR Take 1 tablet (40 mg total) by mouth daily.   BASAGLAR KWIKPEN 100 UNIT/ML Sopn Inject 0.25 mLs (25 Units total) into the skin daily at 10 pm. E11.9  Restart after finished with 70/30 insulin   benzonatate 200 MG capsule Commonly known as:  TESSALON Take 1 capsule (200  mg total) by mouth 3 (three) times daily as needed for cough.   budesonide-formoterol 160-4.5 MCG/ACT inhaler Commonly known as:  SYMBICORT Inhale 2 puffs into the lungs daily.    doxycycline 100 MG tablet Commonly known as:  VIBRA-TABS Take 1 tablet (100 mg total) by mouth every 12 (twelve) hours for 6 doses.   gabapentin 300 MG capsule Commonly known as:  NEURONTIN TAKE 2 CAPSULES (600 MG TOTAL) BY MOUTH 3 (THREE) TIMES DAILY.   glucose blood test strip Commonly known as:  ONE TOUCH ULTRA TEST Use as instructed four times per day E11.9   HUMALOG KWIKPEN 100 UNIT/ML KwikPen Generic drug:  insulin lispro Inject 0.15 mLs (15 Units total) into the skin 3 (three) times daily with meals. Restart after finished with 70/30 insulin   Insulin Pen Needle 32G X 4 MM Misc Commonly known as:  BD PEN NEEDLE NANO U/F Use as directed four times per day daily E11.9   ipratropium-albuterol 0.5-2.5 (3) MG/3ML Soln Commonly known as:  DUONEB Take 3 mLs by nebulization every 4 (four) hours as needed.   pantoprazole 40 MG tablet Commonly known as:  PROTONIX Take 1 tablet (40 mg total) by mouth 2 (two) times daily before a meal.   potassium chloride SA 20 MEQ tablet Commonly known as:  K-DUR,KLOR-CON Take 2 tablets (40 mEq total) by mouth daily.   predniSONE 20 MG tablet Commonly known as:  DELTASONE Take 3 tablets by mouth daily x1 day; then 2 tablets by mouth daily x3 days; then 1 tablet by mouth daily x3 days; then half tablet by mouth daily x3 days and stop prednisone. What changed:    how much to take  how to take this  when to take this  additional instructions   pregabalin 50 MG capsule Commonly known as:  LYRICA Take 1 capsule (50 mg total) by mouth 2 (two) times daily.   Tiotropium Bromide Monohydrate 1.25 MCG/ACT Aers Commonly known as:  SPIRIVA RESPIMAT Inhale 2 puffs into the lungs daily.   torsemide 20 MG tablet Commonly known as:  DEMADEX TAKE 1 TABLET BY MOUTH EVERY DAY   traMADol 50 MG tablet Commonly known as:  ULTRAM Take 1 tablet (50 mg total) by mouth every 6 (six) hours as needed.            Durable Medical Equipment  (From  admission, onward)         Start     Ordered   01/01/19 1339  For home use only DME oxygen  Once    Question Answer Comment  Mode or (Route) Nasal cannula   Liters per Minute 6   Frequency Continuous (stationary and portable oxygen unit needed)   Oxygen conserving device Yes   Oxygen delivery system Gas      01/01/19 1338         Allergies  Allergen Reactions  . Bee Venom Swelling    Pt states she swells up 3x normal size. If stung "2-3 times, my throat could close up"   Follow-up Information    Corwin LevinsJohn, [name], MD. Schedule an appointment as soon as possible for a visit in 10 day(s).   Specialties:  Internal Medicine, Radiology Contact information: [address] [facility] [place] [address] [phone]           The results of significant diagnostics from this hospitalization (including imaging, microbiology, ancillary and laboratory) are listed below for reference.    Significant Diagnostic Studies: Dg Chest 2  View  Result Date: 12/26/2018 CLINICAL DATA:  Productive cough and sore throat since this morning EXAM: CHEST - 2 VIEW COMPARISON:  12/08/2018 FINDINGS: Mild airway thickening bandlike density at the left lung base compatible with scarring or atelectasis. No appreciable consolidation. No blunting of the costophrenic angles. Heart size within normal limits. IMPRESSION: 1. Airway thickening is present, suggesting bronchitis or reactive airways disease. 2. Atelectasis versus scarring at the left lung base. Electronically Signed   By: Gaylyn RongWalter  Liebkemann M.D.   On: 12/26/2018 18:56   Dg Chest Portable 1 View  Result Date: 12/28/2018 CLINICAL DATA:  Shortness of breath and hypoxia EXAM: PORTABLE CHEST 1 VIEW COMPARISON:  Two days ago FINDINGS: Hyperinflation and chronic interstitial coarsening. There is no edema, consolidation, effusion, or pneumothorax. Normal heart size and mediastinal contours. IMPRESSION: COPD without acute superimposed finding. Electronically  Signed   By: Marnee SpringJonathon  Watts M.D.   On: 12/28/2018 07:47   Dg Chest Port 1 View  Result Date: 12/08/2018 CLINICAL DATA:  Dizziness and nausea over the last 3 days with shortness of breath. EXAM: PORTABLE CHEST 1 VIEW COMPARISON:  09/12/2018 FINDINGS: Heart size is normal. Mild chronic interstitial markings. Prominent pericardial fat on the left is a chronic finding. No evidence of acute consolidation, collapse, edema or effusion. IMPRESSION: No active disease. Chronic interstitial markings. Electronically Signed   By: Paulina FusiMark  Shogry M.D.   On: 12/08/2018 21:07    Microbiology: Recent Results (from the past 240 hour(s))  MRSA PCR Screening     Status: None   Collection Time: 12/28/18 11:57 AM  Result Value Ref Range Status   MRSA by PCR NEGATIVE NEGATIVE Final    Comment:        The GeneXpert MRSA Assay (FDA approved for NASAL specimens only), is one component of a comprehensive MRSA colonization surveillance program. It is not intended to diagnose MRSA infection nor to guide or monitor treatment for MRSA infections. Performed at [facility], [address] [place], [place] [address]      Labs: Basic Metabolic Panel: Recent Labs  Lab 12/26/18 1703 12/28/18 0757 12/29/18 0443  NA 135 133* 132*  K 3.6 3.6 4.3  CL 92* 89* 91*  CO2 32 31 32  GLUCOSE 128* 159* 295*  BUN 6 8 13   CREATININE 0.46 0.59 0.52  CALCIUM 9.3 8.7* 8.7*   Liver Function Tests: Recent Labs  Lab 12/28/18 0757  AST 82*  ALT 26  ALKPHOS 97  BILITOT 3.2*  PROT 8.5*  ALBUMIN 4.0   CBC: Recent Labs  Lab 12/26/18 1703 12/28/18 0757 12/29/18 0443  WBC 6.5 7.6 5.7  NEUTROABS 4.7 6.3  --   HGB 14.2 13.3 12.5  HCT 45.7 43.6 40.8  MCV 101.8* 100.9* 100.5*  PLT 116* 100* 94*   Cardiac Enzymes: Recent Labs  Lab 12/28/18 0757  TROPONINI <0.03   BNP: BNP (last 3 results) Recent Labs    05/12/18 0754 09/12/18 0720 12/28/18 0757  BNP 64.2 44.0 58.0    CBG: Recent Labs  Lab  01/01/19 1632 01/01/19 2232 01/02/19 0746 01/02/19 1134 01/02/19 1619  GLUCAP 196* 356* 336* 309* 238*    Signed:  Vassie Lollarlos Glenys Snader MD.  [facility] 01/02/2019, 4:58 PM

## 2019-01-02 NOTE — Care Management Note (Signed)
Case Management Note  Patient Details  Name: Mary [name] MRN: [id number] Date of Birth: [birth year]   If discussed at Long Length of Stay Meetings, dates discussed:  01/02/19  Additional Comments:  Raijon Lindfors, Chrystine Oiler, RN 01/02/2019, 12:18 PM

## 2019-01-04 ENCOUNTER — Telehealth: Payer: Self-pay | Admitting: *Deleted

## 2019-01-04 ENCOUNTER — Ambulatory Visit: Payer: 59 | Admitting: Internal Medicine

## 2019-01-04 NOTE — Telephone Encounter (Signed)
Pt was on TCM report d/c 01/02/19 dx acute chronic respiratory w/hypoxemia. Tried calling pt not able to get through keep getting fast busy signal. Will retry later.Marland Kitchen.Raechel Chute/lmb

## 2019-01-05 NOTE — Telephone Encounter (Signed)
Called pt no answer LMOM RTC need to make hosp f/u appt asap w/PCP. Sent CRM to [facility] for FYI.Marland KitchenRaechel Chute

## 2019-01-25 ENCOUNTER — Other Ambulatory Visit: Payer: Self-pay | Admitting: Internal Medicine

## 2019-01-25 NOTE — Telephone Encounter (Signed)
Done erx 

## 2019-01-30 ENCOUNTER — Inpatient Hospital Stay: Payer: 59 | Admitting: Internal Medicine

## 2019-02-02 DIAGNOSIS — G4733 Obstructive sleep apnea (adult) (pediatric): Secondary | ICD-10-CM | POA: Diagnosis not present

## 2019-02-02 DIAGNOSIS — J449 Chronic obstructive pulmonary disease, unspecified: Secondary | ICD-10-CM | POA: Diagnosis not present

## 2019-02-02 DIAGNOSIS — I270 Primary pulmonary hypertension: Secondary | ICD-10-CM | POA: Diagnosis not present

## 2019-02-06 ENCOUNTER — Ambulatory Visit (INDEPENDENT_AMBULATORY_CARE_PROVIDER_SITE_OTHER): Payer: 59 | Admitting: Internal Medicine

## 2019-02-06 ENCOUNTER — Other Ambulatory Visit (INDEPENDENT_AMBULATORY_CARE_PROVIDER_SITE_OTHER): Payer: 59

## 2019-02-06 ENCOUNTER — Encounter: Payer: Self-pay | Admitting: Internal Medicine

## 2019-02-06 ENCOUNTER — Other Ambulatory Visit: Payer: Self-pay | Admitting: Internal Medicine

## 2019-02-06 VITALS — BP 124/76 | HR 99 | Temp 98.1°F | Ht 63.0 in | Wt 180.0 lb

## 2019-02-06 DIAGNOSIS — E669 Obesity, unspecified: Secondary | ICD-10-CM | POA: Diagnosis not present

## 2019-02-06 DIAGNOSIS — J441 Chronic obstructive pulmonary disease with (acute) exacerbation: Secondary | ICD-10-CM | POA: Diagnosis not present

## 2019-02-06 DIAGNOSIS — E1169 Type 2 diabetes mellitus with other specified complication: Secondary | ICD-10-CM | POA: Diagnosis not present

## 2019-02-06 DIAGNOSIS — J9621 Acute and chronic respiratory failure with hypoxia: Secondary | ICD-10-CM

## 2019-02-06 LAB — BASIC METABOLIC PANEL
BUN: 7 mg/dL (ref 6–23)
CALCIUM: 9.4 mg/dL (ref 8.4–10.5)
CO2: 38 meq/L — AB (ref 19–32)
Chloride: 93 mEq/L — ABNORMAL LOW (ref 96–112)
Creatinine, Ser: 0.45 mg/dL (ref 0.40–1.20)
GFR: 153.31 mL/min (ref >60.00)
Glucose, Bld: 165 mg/dL — ABNORMAL HIGH (ref 70–99)
Potassium: 3.4 mEq/L — ABNORMAL LOW (ref 3.5–5.1)
Sodium: 137 mEq/L (ref 135–145)

## 2019-02-06 MED ORDER — ALBUTEROL SULFATE (2.5 MG/3ML) 0.083% IN NEBU: 2.5000 mg | INHALATION_SOLUTION | Freq: Four times a day (QID) | RESPIRATORY_TRACT | 1 refills | Status: DC | PRN

## 2019-02-06 MED ORDER — POTASSIUM CHLORIDE ER 10 MEQ PO TBCR: 10.0000 meq | EXTENDED_RELEASE_TABLET | Freq: Every day | ORAL | 0 refills | Status: DC

## 2019-02-06 MED ORDER — ALBUTEROL SULFATE HFA 108 (90 BASE) MCG/ACT IN AERS: INHALATION_SPRAY | RESPIRATORY_TRACT | 2 refills | Status: DC

## 2019-02-06 MED ORDER — METHYLPREDNISOLONE ACETATE 80 MG/ML IJ SUSP
80.0000 mg | Freq: Once | INTRAMUSCULAR | Status: AC
  Administered 2019-02-06: 80 mg via INTRAMUSCULAR

## 2019-02-06 NOTE — Progress Notes (Signed)
Subjective:    Patient ID: Mary [name], female    DOB: December 28, 1975, 43 y.o.   MRN: [id number]  HPI  Here to f/u recent hospn; good compiance with home o2, states difficult to get appt with pulmonary, has finished steroid post d/c, but today feels some tightness again with mild sob, Pt denies chest pain, orthopnea, PND, increased LE swelling, palpitations, dizziness or syncope.  Pt denies new neurological symptoms such as new headache, or facial or extremity weakness or numbness.  Pt denies polydipsia, polyuria,.  Needs several med refills.  No new complaints Past Medical History:  Diagnosis Date  . Asthma   . Axillary adenopathy    right axillary adenopathy noted on CT chest (03/06/2012)  . Chronic right-sided CHF (congestive heart failure) (HCC) 03/23/2012   with cor pulmonale. Last RHC 03/2012  . COPD (chronic obstructive pulmonary disease) (HCC)    PFTS 03/08/12: fev1 0.58L/1%, FVC 1.18/33%, Ratop 49 and c/w ssevere obstruction. 21% BD response on FVC, RV 219%, DLCO 11/54%  . Diabetes mellitus without complication (HCC)   . DVT of upper extremity (deep vein thrombosis) [facility]) April 2013   right subclavian // Unclear precipitating cause - possibly significant right heart failure, with vascular stasis potentially predisposing to clotting.    . Exertional shortness of breath   . Hepatic cirrhosis (HCC)    Questionable history of - Noted on CT abdomen (03/2012) - thought to be due to vascular congestion from right heart failure +/- patient's history of alcohol abuse  . History of alcoholism (HCC)   . History of chronic bronchitis   . Irregular menses   . Migraine    "~ 1/yr" (05/03/2013)  . Moderate to severe pulmonary hypertension [facility]) April 2013   Cardiac cath on 03/06/12 - 1. Elevated pulmonary artery pressures, right sided filling pressures.,  2. PA: 64/45 (mean 53)    . On home oxygen therapy    "2-3 L 24/7" (05/03/2013)  . OSA (obstructive sleep apnea)    wears noctural BiPAP  (05/03/2013)  . Periodontal disease   . Pneumonia ~ 1985; 01/2011  . Pulmonary embolus [facility]) April 2013   Precipitating cause unclear. Was treated with coumadin from April-June 2013.  . Pulmonary hypertension (HCC)    Past Surgical History:  Procedure Laterality Date  . APPENDECTOMY  05/03/2013  . CARDIAC CATHETERIZATION  03/2012  . CARDIAC CATHETERIZATION N/A 01/08/2016   Procedure: Right Heart Cath;  Surgeon: Dolores Patty, MD;  [facility] INVASIVE CV LAB;  Service: Cardiovascular;  Laterality: N/A;  . CARDIAC SURGERY  29-Oct-1977   "my heart was backwards" (05/03/2013)  . LAPAROSCOPIC APPENDECTOMY N/A 05/03/2013   Procedure: APPENDECTOMY LAPAROSCOPIC;  Surgeon: Cherylynn Ridges, MD;  Location: [facility] OR;  Service: General;  Laterality: N/A;  . LUNG SURGERY  1977-10-31  . RIGHT HEART CATHETERIZATION N/A 03/07/2012   Procedure: RIGHT HEART CATH;  Surgeon: Kathleene Hazel, MD;  Location: [facility] CATH LAB;  Service: Cardiovascular;  Laterality: N/A;    reports that she has been smoking cigarettes. She has a 6.25 pack-year smoking history. She has never used smokeless tobacco. She reports previous alcohol use. She reports that she does not use drugs. family history includes Alcohol abuse in her father; Heart disease in her father; Hypertension in her father; Other in her brother. Allergies  Allergen Reactions  . Bee Venom Swelling    Pt states she swells up 3x normal size. If stung "2-3 times, my throat could close up"   Current  Outpatient Medications on File Prior to Visit  Medication Sig Dispense Refill  . atorvastatin (LIPITOR) 40 MG tablet Take 1 tablet (40 mg total) by mouth daily. 90 tablet 3  . benzonatate (TESSALON) 200 MG capsule Take 1 capsule (200 mg total) by mouth 3 (three) times daily as needed for cough. 21 capsule 0  . budesonide-formoterol (SYMBICORT) 160-4.5 MCG/ACT inhaler Inhale 2 puffs into the lungs daily. 1 Inhaler 2  . gabapentin (NEURONTIN) 300 MG capsule TAKE 2  CAPSULES (600 MG TOTAL) BY MOUTH 3 (THREE) TIMES DAILY. 540 capsule 1  . glucose blood (ONE TOUCH ULTRA TEST) test strip Use as instructed four times per day E11.9 400 each 12  . HUMALOG KWIKPEN 100 UNIT/ML KwikPen Inject 0.15 mLs (15 Units total) into the skin 3 (three) times daily with meals. Restart after finished with 70/30 insulin 15 mL 11  . Insulin Glargine (BASAGLAR KWIKPEN) 100 UNIT/ML SOPN Inject 0.25 mLs (25 Units total) into the skin daily at 10 pm. E11.9  Restart after finished with 70/30 insulin 5 pen 11  . Insulin Pen Needle (BD PEN NEEDLE NANO U/F) 32G X 4 MM MISC Use as directed four times per day daily E11.9 400 each 3  . ipratropium-albuterol (DUONEB) 0.5-2.5 (3) MG/3ML SOLN Take 3 mLs by nebulization every 4 (four) hours as needed. 360 mL 0  . pantoprazole (PROTONIX) 40 MG tablet Take 1 tablet (40 mg total) by mouth 2 (two) times daily before a meal. 60 tablet 0  . pregabalin (LYRICA) 50 MG capsule Take 1 capsule (50 mg total) by mouth 2 (two) times daily. 60 capsule 3  . Tiotropium Bromide Monohydrate (SPIRIVA RESPIMAT) 1.25 MCG/ACT AERS Inhale 2 puffs into the lungs daily. 1 Inhaler 2  . torsemide (DEMADEX) 20 MG tablet TAKE 1 TABLET BY MOUTH EVERY DAY 30 tablet 2  . traMADol (ULTRAM) 50 MG tablet TAKE 1 TABLET (50 MG TOTAL) BY MOUTH EVERY 6 (SIX) HOURS AS NEEDED. 120 tablet 0  . potassium chloride SA (K-DUR,KLOR-CON) 20 MEQ tablet Take 2 tablets (40 mEq total) by mouth daily. 60 tablet 0   No current facility-administered medications on file prior to visit.    Review of Systems  Constitutional: Negative for other unusual diaphoresis or sweats HENT: Negative for ear discharge or swelling Eyes: Negative for other worsening visual disturbances Respiratory: Negative for stridor or other swelling  Gastrointestinal: Negative for worsening distension or other blood Genitourinary: Negative for retention or other urinary change Musculoskeletal: Negative for other MSK pain or  swelling Skin: Negative for color change or other new lesions Neurological: Negative for worsening tremors and other numbness  Psychiatric/Behavioral: Negative for worsening agitation or other fatigue All other system neg per pt    Objective:   Physical Exam BP 124/76   Pulse 99   Temp 98.1 F (36.7 C) (Oral)   Ht 5\' 3"  (1.6 m)   Wt 180 lb (81.6 kg)   SpO2 93%   BMI 31.89 kg/m  VS noted, with home o2, fatigued Constitutional: Pt appears in NAD HENT: Head: NCAT.  Right Ear: External ear normal.  Left Ear: External ear normal.  Eyes: . Pupils are equal, round, and reactive to light. Conjunctivae and EOM are normal Nose: without d/c or deformity Neck: Neck supple. Gross normal ROM Cardiovascular: Normal rate and regular rhythm.   Pulmonary/Chest: Effort normal and breath sounds decreased without rales or wheezing.  Neurological: Pt is alert. At baseline orientation, motor grossly intact Skin: Skin is warm. No rashes,  other new lesions, no LE edema Psychiatric: Pt behavior is normal without agitation  No other exam findings  Lab Results  Component Value Date   WBC 5.7 12/29/2018   HGB 12.5 12/29/2018   HCT 40.8 12/29/2018   PLT 94 (L) 12/29/2018   GLUCOSE 295 (H) 12/29/2018   CHOL 232 (H) 08/30/2018   TRIG 193.0 (H) 08/30/2018   HDL 24.50 (L) 08/30/2018   LDLCALC 169 (H) 08/30/2018   ALT 26 12/28/2018   AST 82 (H) 12/28/2018   NA 132 (L) 12/29/2018   K 4.3 12/29/2018   CL 91 (L) 12/29/2018   CREATININE 0.52 12/29/2018   BUN 13 12/29/2018   CO2 32 12/29/2018   TSH 3.49 02/24/2018   INR 0.92 02/09/2017   HGBA1C 6.6 (H) 12/28/2018   MICROALBUR 1.4 02/24/2018       Assessment & Plan:

## 2019-02-06 NOTE — Assessment & Plan Note (Signed)
stable overall by history and exam, recent data reviewed with pt, and pt to continue medical treatment as before,  to f/u any worsening symptoms or concerns  

## 2019-02-06 NOTE — Assessment & Plan Note (Signed)
Improved, cont home o2

## 2019-02-06 NOTE — Patient Instructions (Signed)
You had the steroid shot today  Please continue all other medications as before, and refills have been done if requested - the albuterol Inhaler and Nebulizer as needed  Please have the pharmacy call with any other refills you may need.  Please continue your efforts at being more active, low cholesterol diet, and weight control.  Please keep your appointments with your specialists as you may have planned  Please go to the LAB in the Basement (turn left off the elevator) for the tests to be done today  You will be contacted by phone if any changes need to be made immediately.  Otherwise, you will receive a letter about your results with an explanation, but please check with MyChart first.  Please remember to sign up for MyChart if you have not done so, as this will be important to you in the future with finding out test results, communicating by private email, and scheduling acute appointments online when needed.  Please return in 6 months, or sooner if needed

## 2019-02-06 NOTE — Assessment & Plan Note (Signed)
Mild to mod, for depomedrol IM 80,  to f/u any worsening symptoms or concerns 

## 2019-02-07 ENCOUNTER — Telehealth: Payer: Self-pay

## 2019-02-07 NOTE — Telephone Encounter (Signed)
-----   Message from Corwin Levins, MD sent at 02/06/2019  7:50 PM EST ----- Letter sent, cont same tx except  The test results show that your current treatment is OK, except the potassium is mild low.  Please take 3 days of potassium that I will send to the pharmacy.    Aaleyah Witherow to please inform pt, I will do rx

## 2019-02-07 NOTE — Telephone Encounter (Signed)
Pt has been informed of results and expressed understanding.  °

## 2019-02-12 IMAGING — CT CT ANGIO CHEST
2 of 7 series · 18 of 46 positions shown · IV contrast (iopamidol)
Comparison: CXR 02/08/2018, chest CT 04/30/2017

CLINICAL DATA: Dyspnea x2 days. Productive cough and chest pain
from the cough. No fever.

EXAM:
CT ANGIOGRAPHY CHEST WITH CONTRAST
TECHNIQUE: Multidetector CT imaging of the chest was performed using the
standard protocol during bolus administration of intravenous
contrast. Multiplanar CT image reconstructions and MIPs were
obtained to evaluate the vascular anatomy.
CONTRAST:  100mL AW3P0W-HGK IOPAMIDOL (AW3P0W-HGK) INJECTION 76%

[Series 5: thins · axial · 0.76mm/px · z∈[+1216,+1510]mm · 15 of 334 slices shown]
[im 20/334  lung]
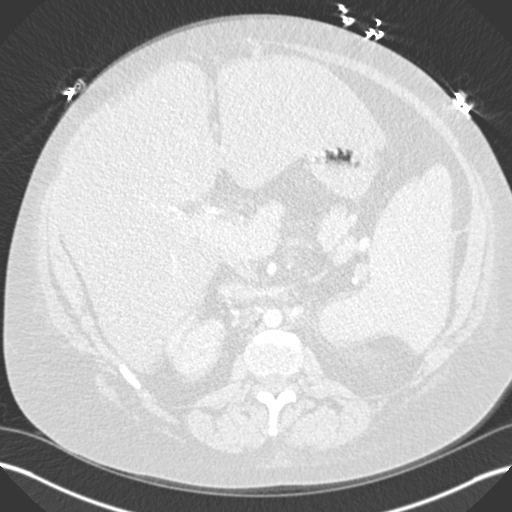
[im 40/334  soft-tissue]
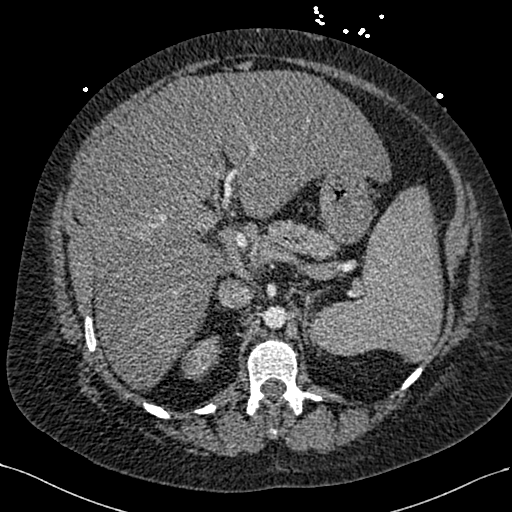
[im 59/334  lung]
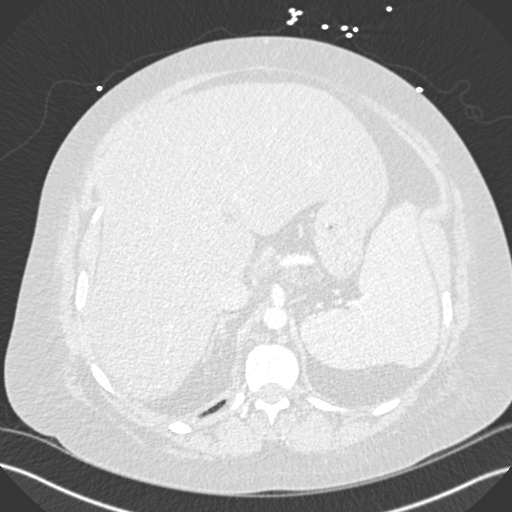
[im 79/334  soft-tissue]
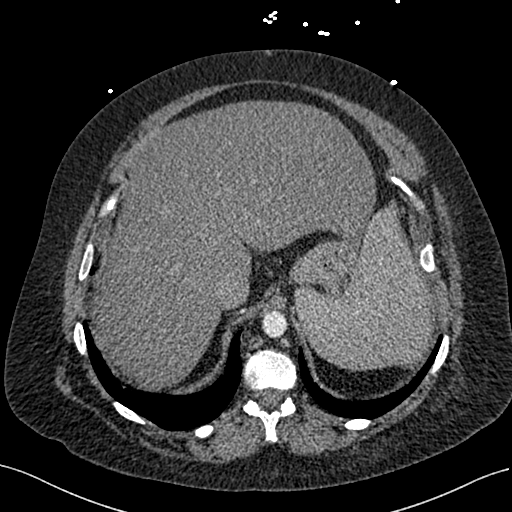
[im 98/334  lung]
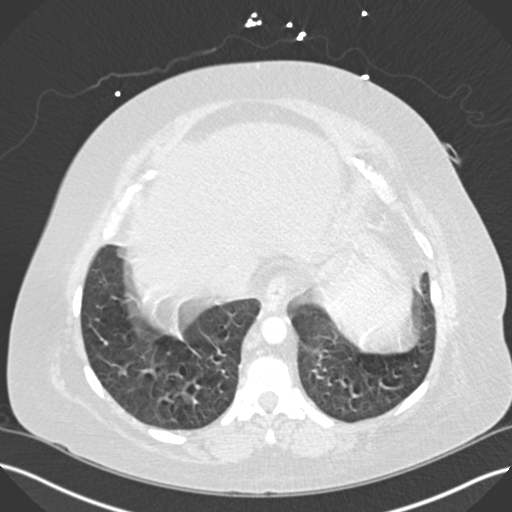
[im 118/334  soft-tissue]
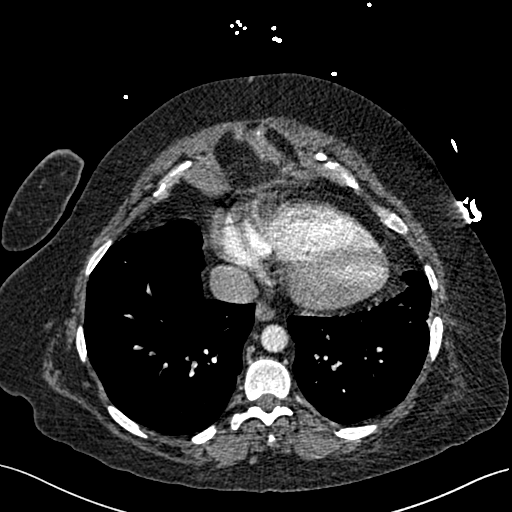
[im 138/334  lung]
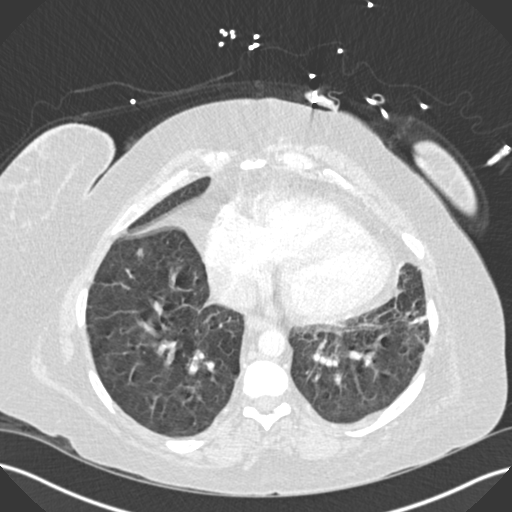
[im 177/334  soft-tissue]
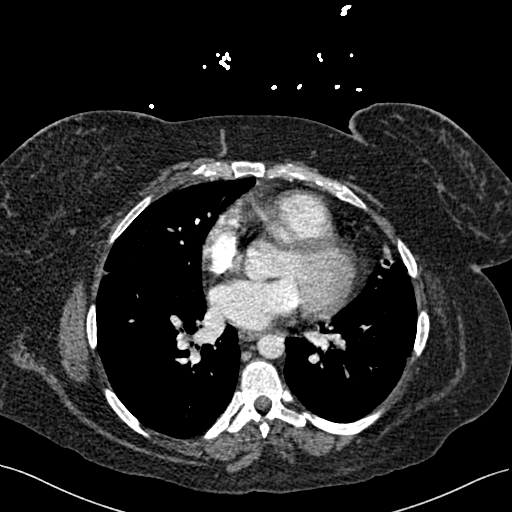
[im 196/334  lung]
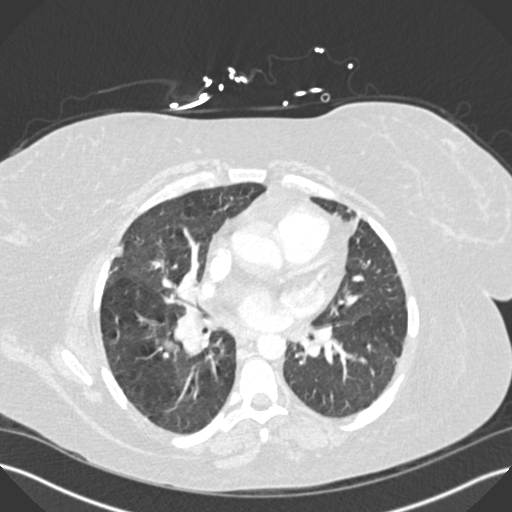
[im 216/334  soft-tissue]
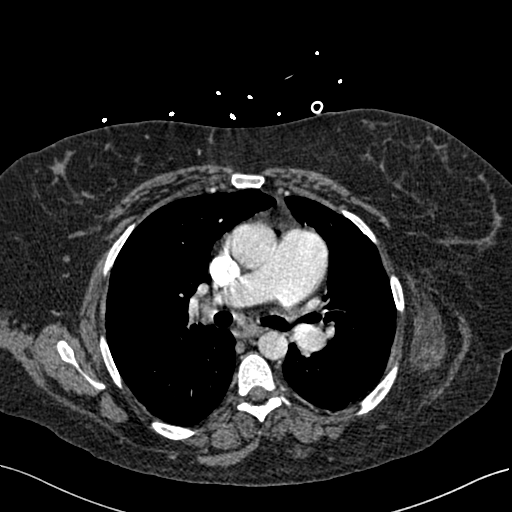
[im 236/334  lung]
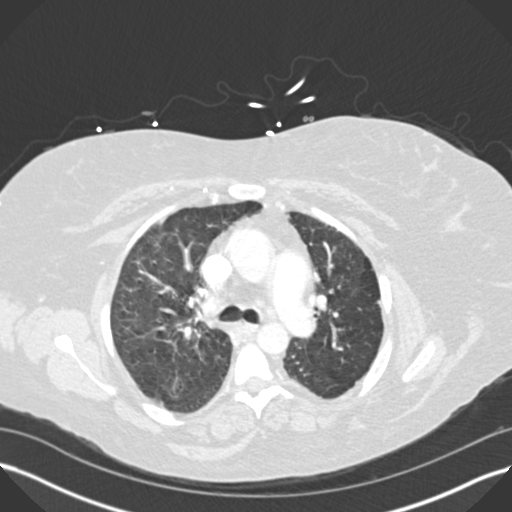
[im 255/334  soft-tissue]
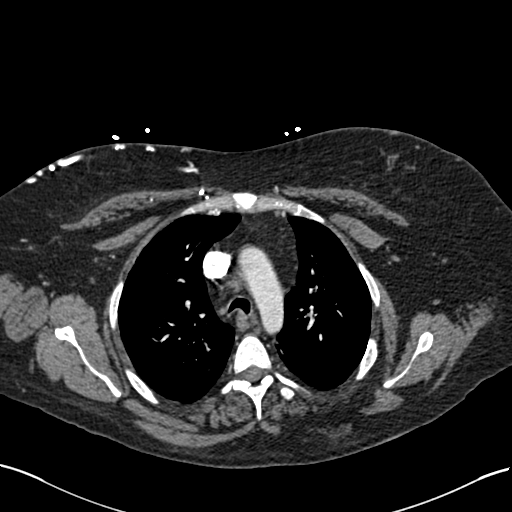
[im 275/334  lung]
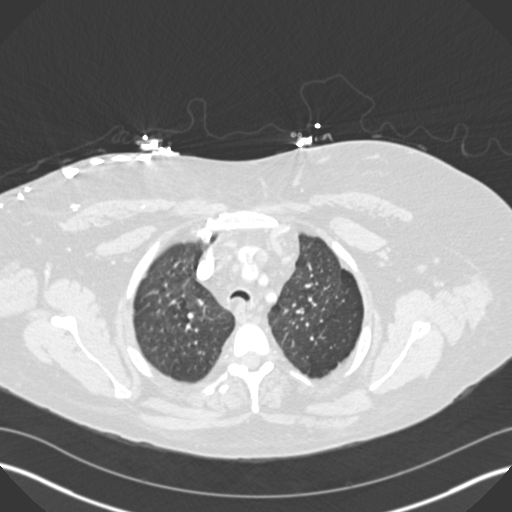
[im 294/334  soft-tissue]
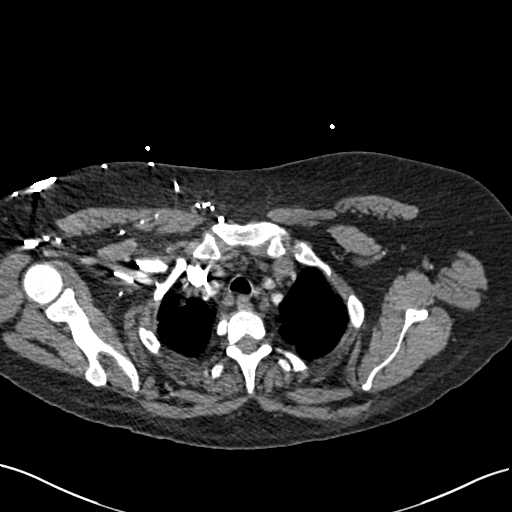
[im 314/334  lung]
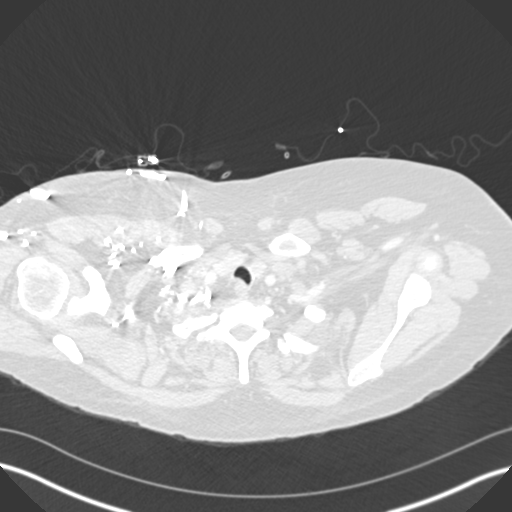

[Series 7: coronal mpr · coronal · 0.64mm/px · 3 of 171 slices shown]
[im 43/171  soft-tissue]
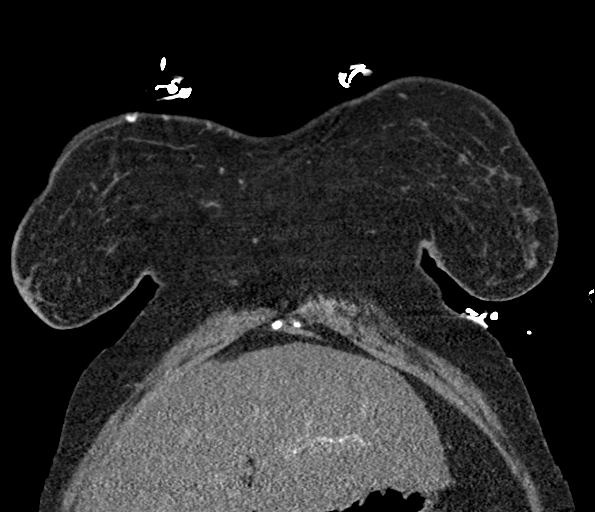
[im 86/171  soft-tissue]
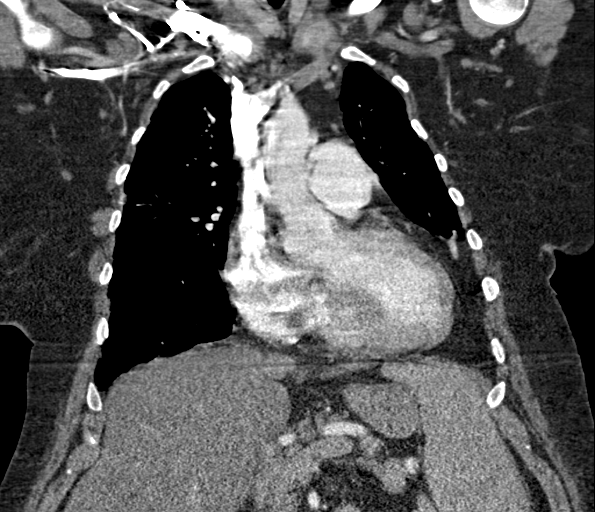
[im 128/171  soft-tissue]
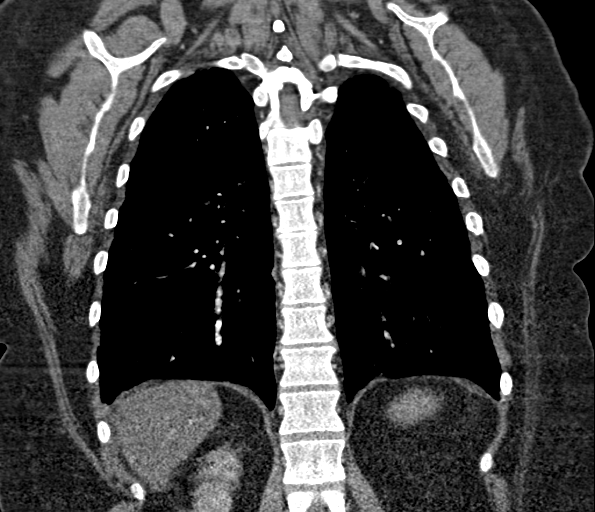

[18 of 46 positions shown; findings below may reference images not displayed]

FINDINGS: Adequate opacification of the central pulmonary arteries. Limited
assessment at the segmental and subsegmental levels due to motion
artifacts and timing of contrast.

Cardiovascular: No large central pulmonary embolus noted. Normal
branch pattern of the great vessels. Limited assessment of the
ascending aorta due to motion artifacts. No definite dissection or
aneurysm. Mild dilatation of the main pulmonary artery to 3.5 cm
compatible with chronic pulmonary hypertension. Minimal coronary
arteriosclerosis. Borderline cardiomegaly. No pericardial effusion.

Mediastinum/Nodes: No enlarged mediastinal, hilar, or axillary lymph
nodes. Thyroid gland, trachea, and esophagus demonstrate no
significant findings.

Lungs/Pleura: Respiratory motion artifacts limit assessment at the
lung bases. Posterior segment right upper lobe airspace opacity is
noted which may reflect a small focus of pneumonia, alveolitis or
pneumonitis among some possibilities though not exclusive. Linear
opacity noted in the right lower lobe, new since prior and may
reflect postinfectious or postinflammatory change as well versus
atelectasis or scarring. Subpleural areas of atelectasis and/or
scarring are noted in both upper lobes, lingula and left lower lobe.

Upper Abdomen: Hepatic steatosis is noted.

Musculoskeletal: No acute or significant osseous findings.

Review of the MIP images confirms the above findings.
IMPRESSION: 1. No large central pulmonary embolus. Evaluation of the lower lobe
pulmonary parenchyma as well as segmental and subsegmental pulmonary
arteries are limited due to motion artifacts.
2. Minimal coronary arteriosclerosis and aortic atherosclerosis.
3. Mild dilatation of the main pulmonary artery likely reflecting
chronic pulmonary hypertension.
4. Hepatic steatosis.

Aortic Atherosclerosis ([id number].X).

## 2019-02-28 ENCOUNTER — Ambulatory Visit: Payer: 59 | Admitting: Internal Medicine

## 2019-03-01 ENCOUNTER — Ambulatory Visit (INDEPENDENT_AMBULATORY_CARE_PROVIDER_SITE_OTHER): Payer: 59 | Admitting: Internal Medicine

## 2019-03-01 DIAGNOSIS — M545 Low back pain, unspecified: Secondary | ICD-10-CM | POA: Insufficient documentation

## 2019-03-01 MED ORDER — DIAZEPAM 2 MG PO TABS: 2.0000 mg | ORAL_TABLET | Freq: Every day | ORAL | 0 refills | Status: AC

## 2019-03-01 MED ORDER — HYDROCODONE-ACETAMINOPHEN 5-325 MG PO TABS: 1.0000 | ORAL_TABLET | Freq: Four times a day (QID) | ORAL | 0 refills | Status: DC | PRN

## 2019-03-01 NOTE — Patient Instructions (Signed)
Pt seen at Virtual Visit

## 2019-03-01 NOTE — Progress Notes (Signed)
Virtual Visit via Video Note  I connected with Jana Hakim on 03/01/19 at  1:20 PM EDT by a video enabled telemedicine application and verified that I am speaking with the correct person using two identifiers.   I discussed the limitations of evaluation and management by telemedicine and the availability of in person appointments. The patient expressed understanding and agreed to proceed.  History of Present Illness: Pt c/o acute onset 3 days severe mid very lower back pain, sharp and dull at times, constant but severe to where she simply cannot ambulate well.  Husband has to assist with her ADLs for now, and pt on home o2.  Pain started after simply twisting a bit over the side of the bed to pick up her CPAP mask that has fallen to the floor, had sudden pain then not better since with heat, tramadol, leftover muscle relaxer from before and even lidoderm.  Pt denies bowel or bladder change, fever, wt loss,  worsening LE pain/numbness/weakness, gait change or falls, though has chronic intermittent constipation.  Denies urinary symptoms such as dysuria, frequency, urgency, flank pain, hematuria or n/v, fever, chills.  Denies worsening reflux, abd pain, dysphagia, n/v, bowel change or blood.  Pt denies chest pain, increased sob or doe, wheezing, orthopnea, PND, increased LE swelling, palpitations, dizziness or syncope.  No prior significant lumbar disease or imaging Past Medical History:  Diagnosis Date  . Asthma   . Axillary adenopathy    right axillary adenopathy noted on CT chest (03/06/2012)  . Chronic right-sided CHF (congestive heart failure) (HCC) 03/23/2012   with cor pulmonale. Last RHC 03/2012  . COPD (chronic obstructive pulmonary disease) (HCC)    PFTS 03/08/12: fev1 0.58L/1%, FVC 1.18/33%, Ratop 49 and c/w ssevere obstruction. 21% BD response on FVC, RV 219%, DLCO 11/54%  . Diabetes mellitus without complication (HCC)   . DVT of upper extremity (deep vein thrombosis) [facility]) April 2013    right subclavian // Unclear precipitating cause - possibly significant right heart failure, with vascular stasis potentially predisposing to clotting.    . Exertional shortness of breath   . Hepatic cirrhosis (HCC)    Questionable history of - Noted on CT abdomen (03/2012) - thought to be due to vascular congestion from right heart failure +/- patient's history of alcohol abuse  . History of alcoholism (HCC)   . History of chronic bronchitis   . Irregular menses   . Migraine    "~ 1/yr" (05/03/2013)  . Moderate to severe pulmonary hypertension [facility]) April 2013   Cardiac cath on 03/06/12 - 1. Elevated pulmonary artery pressures, right sided filling pressures.,  2. PA: 64/45 (mean 53)    . On home oxygen therapy    "2-3 L 24/7" (05/03/2013)  . OSA (obstructive sleep apnea)    wears noctural BiPAP (05/03/2013)  . Periodontal disease   . Pneumonia ~ 1985; 01/2011  . Pulmonary embolus [facility]) April 2013   Precipitating cause unclear. Was treated with coumadin from April-June 2013.  . Pulmonary hypertension (HCC)    Past Surgical History:  Procedure Laterality Date  . APPENDECTOMY  05/03/2013  . CARDIAC CATHETERIZATION  03/2012  . CARDIAC CATHETERIZATION N/A 01/08/2016   Procedure: Right Heart Cath;  Surgeon: Dolores Patty, MD;  Location: [facility] INVASIVE CV LAB;  Service: Cardiovascular;  Laterality: N/A;  . CARDIAC SURGERY  04/19/77   "my heart was backwards" (05/03/2013)  . LAPAROSCOPIC APPENDECTOMY N/A 05/03/2013   Procedure: APPENDECTOMY LAPAROSCOPIC;  Surgeon: Cherylynn Ridges, MD;  Location: [facility] OR;  Service: General;  Laterality: N/A;  . LUNG SURGERY  05-09-77  . RIGHT HEART CATHETERIZATION N/A 03/07/2012   Procedure: RIGHT HEART CATH;  Surgeon: Kathleene Hazel, MD;  Location: [facility] CATH LAB;  Service: Cardiovascular;  Laterality: N/A;    reports that she has been smoking cigarettes. She has a 6.25 pack-year smoking history. She has never used smokeless tobacco. She reports previous alcohol  use. She reports that she does not use drugs. family history includes Alcohol abuse in her father; Heart disease in her father; Hypertension in her father; Other in her brother. Allergies  Allergen Reactions  . Bee Venom Swelling    Pt states she swells up 3x normal size. If stung "2-3 times, my throat could close up"   Current Outpatient Medications on File Prior to Visit  Medication Sig Dispense Refill  . albuterol (PROVENTIL HFA;VENTOLIN HFA) 108 (90 Base) MCG/ACT inhaler USE 2 PUFFS EVERY 6 HOURS AS NEEDED FOR WHEEZING 18 g 2  . albuterol (PROVENTIL) (2.5 MG/3ML) 0.083% nebulizer solution Take 3 mLs (2.5 mg total) by nebulization every 6 (six) hours as needed for wheezing or shortness of breath. 75 mL 1  . atorvastatin (LIPITOR) 40 MG tablet Take 1 tablet (40 mg total) by mouth daily. 90 tablet 3  . benzonatate (TESSALON) 200 MG capsule Take 1 capsule (200 mg total) by mouth 3 (three) times daily as needed for cough. 21 capsule 0  . budesonide-formoterol (SYMBICORT) 160-4.5 MCG/ACT inhaler Inhale 2 puffs into the lungs daily. 1 Inhaler 2  . gabapentin (NEURONTIN) 300 MG capsule TAKE 2 CAPSULES (600 MG TOTAL) BY MOUTH 3 (THREE) TIMES DAILY. 540 capsule 1  . glucose blood (ONE TOUCH ULTRA TEST) test strip Use as instructed four times per day E11.9 400 each 12  . HUMALOG KWIKPEN 100 UNIT/ML KwikPen Inject 0.15 mLs (15 Units total) into the skin 3 (three) times daily with meals. Restart after finished with 70/30 insulin 15 mL 11  . Insulin Glargine (BASAGLAR KWIKPEN) 100 UNIT/ML SOPN Inject 0.25 mLs (25 Units total) into the skin daily at 10 pm. E11.9  Restart after finished with 70/30 insulin 5 pen 11  . Insulin Pen Needle (BD PEN NEEDLE NANO U/F) 32G X 4 MM MISC Use as directed four times per day daily E11.9 400 each 3  . ipratropium-albuterol (DUONEB) 0.5-2.5 (3) MG/3ML SOLN Take 3 mLs by nebulization every 4 (four) hours as needed. 360 mL 0  . pantoprazole (PROTONIX) 40 MG tablet Take 1  tablet (40 mg total) by mouth 2 (two) times daily before a meal. 60 tablet 0  . potassium chloride (K-DUR) 10 MEQ tablet Take 1 tablet (10 mEq total) by mouth daily for 3 days. 3 tablet 0  . potassium chloride SA (K-DUR,KLOR-CON) 20 MEQ tablet Take 2 tablets (40 mEq total) by mouth daily. 60 tablet 0  . pregabalin (LYRICA) 50 MG capsule Take 1 capsule (50 mg total) by mouth 2 (two) times daily. 60 capsule 3  . Tiotropium Bromide Monohydrate (SPIRIVA RESPIMAT) 1.25 MCG/ACT AERS Inhale 2 puffs into the lungs daily. 1 Inhaler 2  . torsemide (DEMADEX) 20 MG tablet TAKE 1 TABLET BY MOUTH EVERY DAY 30 tablet 2  . traMADol (ULTRAM) 50 MG tablet TAKE 1 TABLET (50 MG TOTAL) BY MOUTH EVERY 6 (SIX) HOURS AS NEEDED. 120 tablet 0   No current facility-administered medications on file prior to visit.    Observations/Objective: Obese, home o2 in place, mentating well, mild nervous at best,  and grimacing in pain at times, o/w appears in NAD.    Assessment and Plan: Acute low back pain -            I agree with pt this appears to be c/w simple lower back strain, not improved with conservative measures and home muscle relaxer and tramadol.  No red flags, no neuro complaints, afeb per pt; Will tx with Norco 5 325 q 6 prn #30, and Valium 2 mg qd x 3 days, sent to [facility]   Follow Up Instructions:   Pt will call for any persistent or worsening s/s.  She does not appear to need further lab testing, imaging or referral to ED   I discussed the assessment and treatment plan with the patient. The patient was provided an opportunity to ask questions and all were answered. The patient agreed with the plan and demonstrated an understanding of the instructions.   The patient was advised to call back or seek an in-person evaluation if the symptoms worsen or if the condition fails to improve as anticipated.  I provided 17 minutes of non-face-to-face time during this encounter.   Oliver BarreJames [name], MD

## 2019-03-01 NOTE — Assessment & Plan Note (Signed)
Per virtual visit - Acute low back pain -            I agree with pt that this appears to be c/w simple lower back MSK strain, not improved with conservative measures and muscle relaxer and tramadol.  No red flags, no neuro complaints, afeb per pt; Will tx with Norco 5 325 q 6 prn #30, and Valium 2 mg qd x 3 days, sent to [facility] [address]  Pt to follow up with any worsening s/s

## 2019-03-06 ENCOUNTER — Ambulatory Visit (INDEPENDENT_AMBULATORY_CARE_PROVIDER_SITE_OTHER): Payer: 59 | Admitting: Adult Health

## 2019-03-06 ENCOUNTER — Other Ambulatory Visit: Payer: Self-pay

## 2019-03-06 ENCOUNTER — Encounter: Payer: Self-pay | Admitting: Adult Health

## 2019-03-06 ENCOUNTER — Ambulatory Visit: Payer: 59 | Admitting: Internal Medicine

## 2019-03-06 DIAGNOSIS — G4733 Obstructive sleep apnea (adult) (pediatric): Secondary | ICD-10-CM | POA: Diagnosis not present

## 2019-03-06 DIAGNOSIS — J449 Chronic obstructive pulmonary disease, unspecified: Secondary | ICD-10-CM | POA: Diagnosis not present

## 2019-03-06 MED ORDER — IPRATROPIUM-ALBUTEROL 0.5-2.5 (3) MG/3ML IN SOLN: 3.0000 mL | RESPIRATORY_TRACT | 0 refills | Status: DC | PRN

## 2019-03-06 MED ORDER — BUDESONIDE-FORMOTEROL FUMARATE 160-4.5 MCG/ACT IN AERO: 2.0000 | INHALATION_SPRAY | Freq: Every day | RESPIRATORY_TRACT | 3 refills | Status: DC

## 2019-03-06 NOTE — Progress Notes (Signed)
Virtual Visit via Telephone Note  I connected with Mary [name] on 03/06/19 at  2:30 PM EDT by telephone and verified that I am speaking with the correct person using two identifiers.   I discussed the limitations, risks, security and privacy concerns of performing an evaluation and management service by telephone and the availability of in person appointments. I also discussed with the patient that there may be a patient responsible charge related to this service. The patient expressed understanding and agreed to proceed.   History of Present Illness: Mary [name] is a 43 -year-old woman with severe COPD and chronic hypoxic / hypercarbic respiratory failure on home oxygen. And nocturnal  BIPAP  Has component of hypoventilation due to underlying cardiac pulmonary disease  [facility] for Transplant eval 02/2016 but turned down since she was "not sick enough" and also was overweight.    Today's Televisit is for a follow up for COPD , OSA, O2 Respiratory Failure .  Patient has very severe COPD.  She is on Symbicort and Spiriva.  She was hospitalized January for a COPD exacerbation.  Says she has been slowly improving since this discharge.  She was discharged on 6 L of oxygen but has gotten down to 3 L.  Denies any recent increased cough or congestion.  Still gets winded with minimal activity. We discussed current precautions with home isolation and social distancing for COVID pandemic Chest x-ray in January showed COPD changes  She has obstructive sleep apnea.On BiPAP At bedtime with Oxygen 3l/m  BiPAP download shows excellent control with 100% usage.  Daily average usage at 14 hours.  Patient is on IPAP 20 EPAP 10, AHI 2.9.  Minimal leaks.  She has chronic respiratory failure on  oxygen 3 l/m rest .    Observations/Objective:  Last ABG 04/26/2017 was 7.38/73/91 on bilevel and 7.39/85/55 on 4 L oxygen  04/2012 PFTs FEV1 24%, ratio 48, positive bronchodilator response with a 31% change, FVC 42% DLCO  32% 2013 Alpha 1 MM 340  HRCT Chest 01/2017 >no ILD , RUL nodule -ggo 2.1cm stable since 2016 (slightly enlarged from 2015)   Echo EF 65%, Gr 1 DD , PAP .  NPSG 03/2012 >AHI 4.6/hr , O2 desats 73%  CPAP 04/2017  19/11 + 4L O2, severe REM desatns  Echo 01/2017-normal LVEF, RV dilated with RVSP 42   Assessment and Plan: COPD recent exacerbation with hospitalization January 2020 seems to have improved and back to baseline Chronic hypoxic respiratory failure compensated on oxygen Obstructive sleep apnea stable on BiPAP with oxygen nocturnally  Plan  Patient Instructions  Continue on Symbicort  and Spiriva . , rinse after use .  Continue on Oxygen 3l/m .  Continue on BIPAP At bedtime with oxygen . And with naps.  Follow up Dr. Marchelle Gearing  in 3 months and As needed   Please contact office for sooner follow up if symptoms do not improve or worsen or seek emergency care            Follow Up Instructions: Follow up with Dr. Marchelle Gearing in 3 months and As needed   Please contact office for sooner follow up if symptoms do not improve or worsen or seek emergency care      I discussed the assessment and treatment plan with the patient. The patient was provided an opportunity to ask questions and all were answered. The patient agreed with the plan and demonstrated an understanding of the instructions.   The patient was advised to call back  or seek an in-person evaluation if the symptoms worsen or if the condition fails to improve as anticipated.  I provided 25 minutes of non-face-to-face time during this encounter. Patient present at home, myself present in office for today's visit   Rubye Oaks, NP

## 2019-03-06 NOTE — Patient Instructions (Addendum)
Continue on Symbicort  and Spiriva . , rinse after use .  Continue on Oxygen 3l/m .  Continue on BIPAP At bedtime with oxygen . And with naps.  Follow up Dr. Marchelle Gearing  in 3 months and As needed   Please contact office for sooner follow up if symptoms do not improve or worsen or seek emergency care

## 2019-03-08 ENCOUNTER — Telehealth: Payer: Self-pay | Admitting: Internal Medicine

## 2019-03-08 MED ORDER — ALBUTEROL SULFATE (2.5 MG/3ML) 0.083% IN NEBU: 2.5000 mg | INHALATION_SOLUTION | Freq: Four times a day (QID) | RESPIRATORY_TRACT | 3 refills | Status: DC | PRN

## 2019-03-08 NOTE — Telephone Encounter (Signed)
Called and spoke with pt. Pt was requesting a refill of albuterol neb sol but stated to me that TP had stated for her to do duoneb solution. Stated to pt that the albuterol neb sol is included in the duoneb. Pt asked how often she could do the duoneb and I stated to her that it is as needed every 4 hours. Pt expressed understanding. Nothing further needed.

## 2019-03-13 ENCOUNTER — Other Ambulatory Visit: Payer: Self-pay | Admitting: Internal Medicine

## 2019-03-13 NOTE — Telephone Encounter (Signed)
Done erx 

## 2019-03-31 ENCOUNTER — Other Ambulatory Visit: Payer: Self-pay | Admitting: Internal Medicine

## 2019-05-07 ENCOUNTER — Ambulatory Visit: Payer: 59 | Admitting: Internal Medicine

## 2019-05-07 ENCOUNTER — Ambulatory Visit (INDEPENDENT_AMBULATORY_CARE_PROVIDER_SITE_OTHER): Payer: Self-pay | Admitting: Family

## 2019-05-07 ENCOUNTER — Encounter: Payer: Self-pay | Admitting: Family

## 2019-05-07 ENCOUNTER — Other Ambulatory Visit: Payer: Self-pay

## 2019-05-07 DIAGNOSIS — N390 Urinary tract infection, site not specified: Secondary | ICD-10-CM

## 2019-05-07 DIAGNOSIS — R319 Hematuria, unspecified: Secondary | ICD-10-CM

## 2019-05-07 MED ORDER — NITROFURANTOIN MONOHYD MACRO 100 MG PO CAPS: 100.0000 mg | ORAL_CAPSULE | Freq: Two times a day (BID) | ORAL | 0 refills | Status: DC

## 2019-05-07 NOTE — Progress Notes (Signed)
Mary [name] is a 43 y.o. female with the following history as recorded in [employer]:  Patient Active Problem List   Diagnosis Date Noted  . Low back pain 03/01/2019  . Acute on chronic respiratory failure with hypoxemia (HCC) 12/28/2018  . Migraine 12/28/2018  . Neuropathy 12/28/2018  . Chronic pain 08/30/2018  . COPD exacerbation (HCC) 07/03/2018  . COPD with exacerbation (HCC) 05/03/2018  . COPD with acute exacerbation (HCC) 01/11/2018  . Left flank pain 11/01/2017  . Positive D dimer   . Leukocytosis 04/29/2017  . Acute exacerbation of chronic obstructive pulmonary disease (COPD) (HCC) 04/24/2017  . 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 Left ankle sprain 04/21/2017  . Fungemia 02/13/2017  . COPD (chronic obstructive pulmonary disease) (HCC) 02/13/2017  . Chronic respiratory failure with hypoxia and hypercapnia (HCC) 02/13/2017  . Thrombocytopenia (HCC) 02/01/2017  . Alcohol abuse 01/31/2017  . Acute on chronic respiratory failure with hypoxia and hypercapnia (HCC)   . Tracheomalacia   . Palliative care by specialist   . Anxiety state   . Advance directive declined by patient   . Goals of care, counseling/discussion   . Chronic hoarseness 07/27/2016  . Diabetic neuropathy (HCC) 04/30/2016  . Diabetes mellitus type 2 in obese (HCC) 04/10/2016  . Chronic diastolic heart failure (HCC) 01/25/2016  . PAH (pulmonary artery hypertension) (HCC)   . Constipation 11/18/2015  . Morbid obesity (HCC) 11/18/2015  . Lung nodule 01/30/2015  . Pulmonary mass 01/13/2015  . Gastroparesis   . Esophageal reflux   . Obstipation   . Hypokalemia 02/28/2013  . Financial difficulties 02/28/2013  . Obesity hypoventilation syndrome (HCC) 02/28/2013  . Myalgia 02/28/2013  . Influenza A 02/28/2013  . Anovulation 06/30/2012  . Preventative health care 05/05/2012  . Irregular menstrual cycle 04/27/2012  . Chronic right-sided CHF (congestive heart failure) (HCC) 03/23/2012  .  Pulmonary hypertension (HCC) 03/23/2012  . Cor pulmonale (HCC) 03/21/2012  . OSA on BiPAP 03/19/2012  . Axillary lymphadenopathy 03/06/2012  . Congenital heart disease 01/28/2012  . Dental caries 01/28/2012    Current Outpatient Medications  Medication Sig Dispense Refill  . albuterol (PROVENTIL HFA;VENTOLIN HFA) 108 (90 Base) MCG/ACT inhaler USE 2 PUFFS EVERY 6 HOURS AS NEEDED FOR WHEEZING 18 g 2  . budesonide-formoterol (SYMBICORT) 160-4.5 MCG/ACT inhaler Inhale 2 puffs into the lungs daily. 1 Inhaler 3  . gabapentin (NEURONTIN) 300 MG capsule TAKE 2 CAPSULES (600 MG TOTAL) BY MOUTH 3 (THREE) TIMES DAILY. 540 capsule 1  . glucose blood (ONE TOUCH ULTRA TEST) test strip Use as instructed four times per day E11.9 400 each 12  . HUMALOG KWIKPEN 100 UNIT/ML KwikPen Inject 0.15 mLs (15 Units total) into the skin 3 (three) times daily with meals. Restart after finished with 70/30 insulin 15 mL 11  . HYDROcodone-acetaminophen (NORCO) 5-325 MG tablet Take 1 tablet by mouth every 6 (six) hours as needed for moderate pain. 30 tablet 0  . Insulin Glargine (BASAGLAR KWIKPEN) 100 UNIT/ML SOPN Inject 0.25 mLs (25 Units total) into the skin daily at 10 pm. E11.9  Restart after finished with 70/30 insulin (Patient taking differently: Inject 20 Units into the skin daily at 10 pm. E11.9  Restart after finished with 70/30 insulin) 5 pen 11  . Insulin Pen Needle (BD PEN NEEDLE NANO U/F) 32G X 4 MM MISC Use as directed four times per day daily E11.9 400 each 3  . ipratropium-albuterol (DUONEB) 0.5-2.5 (3) MG/3ML SOLN Take 3 mLs by nebulization every 4 (four) hours as  needed. 360 mL 0  . nitrofurantoin, macrocrystal-monohydrate, (MACROBID) 100 MG capsule Take 1 capsule (100 mg total) by mouth 2 (two) times daily. 14 capsule 0  . pantoprazole (PROTONIX) 40 MG tablet Take 1 tablet (40 mg total) by mouth 2 (two) times daily before a meal. 60 tablet 0  . pregabalin (LYRICA) 50 MG capsule Take 1 capsule (50 mg total) by  mouth 2 (two) times daily. 60 capsule 3  . Tiotropium Bromide Monohydrate (SPIRIVA RESPIMAT) 1.25 MCG/ACT AERS Inhale 2 puffs into the lungs daily. 1 Inhaler 2  . torsemide (DEMADEX) 20 MG tablet TAKE 1 TABLET BY MOUTH EVERY DAY 30 tablet 2  . traMADol (ULTRAM) 50 MG tablet TAKE 1 TABLET BY MOUTH EVERY 6 HOURS AS NEEDED. 120 tablet 1   No current facility-administered medications for this visit.     Allergies: Bee venom  Past Medical History:  Diagnosis Date  . Asthma   . Axillary adenopathy    right axillary adenopathy noted on CT chest (03/06/2012)  . Chronic right-sided CHF (congestive heart failure) (HCC) 03/23/2012   with cor pulmonale. Last RHC 03/2012  . COPD (chronic obstructive pulmonary disease) (HCC)    PFTS 03/08/12: fev1 0.58L/1%, FVC 1.18/33%, Ratop 49 and c/w ssevere obstruction. 21% BD response on FVC, RV 219%, DLCO 11/54%  . Diabetes mellitus without complication (HCC)   . DVT of upper extremity (deep vein thrombosis) [facility]) April 2013   right subclavian // Unclear precipitating cause - possibly significant right heart failure, with vascular stasis potentially predisposing to clotting.    . Exertional shortness of breath   . Hepatic cirrhosis (HCC)    Questionable history of - Noted on CT abdomen (03/2012) - thought to be due to vascular congestion from right heart failure +/- patient's history of alcohol abuse  . History of alcoholism (HCC)   . History of chronic bronchitis   . Irregular menses   . Migraine    "~ 1/yr" (05/03/2013)  . Moderate to severe pulmonary hypertension [facility]) April 2013   Cardiac cath on 03/06/12 - 1. Elevated pulmonary artery pressures, right sided filling pressures.,  2. PA: 64/45 (mean 53)    . On home oxygen therapy    "2-3 L 24/7" (05/03/2013)  . OSA (obstructive sleep apnea)    wears noctural BiPAP (05/03/2013)  . Periodontal disease   . Pneumonia ~ 1985; 01/2011  . Pulmonary embolus [facility]) April 2013   Precipitating cause unclear. Was treated  with coumadin from April-June 2013.  . Pulmonary hypertension (HCC)     Past Surgical History:  Procedure Laterality Date  . APPENDECTOMY  05/03/2013  . CARDIAC CATHETERIZATION  03/2012  . CARDIAC CATHETERIZATION N/A 01/08/2016   Procedure: Right Heart Cath;  Surgeon: Dolores Patty, MD;  Location[facility] INVASIVE CV LAB;  Service: Cardiovascular;  Laterality: N/A;  . CARDIAC SURGERY  November 11, 1977   "my heart was backwards" (05/03/2013)  . LAPAROSCOPIC APPENDECTOMY N/A 05/03/2013   Procedure: APPENDECTOMY LAPAROSCOPIC;  Surgeon: Cherylynn Ridges, MD;  Location: [place] OR;  Service: General;  Laterality: N/A;  . LUNG SURGERY  09-22-77  . RIGHT HEART CATHETERIZATION N/A 03/07/2012   Procedure: RIGHT HEART CATH;  Surgeon: Kathleene Hazel, MD;  Location: [facility] CATH LAB;  Service: Cardiovascular;  Laterality: N/A;    Family History  Problem Relation Age of Onset  . Hypertension Father   . Heart disease Father        CHF; died age 42   . Alcohol abuse Father   .  Other Brother        died age 42 y.o overdose    Social History   Tobacco Use  . Smoking status: Former Smoker    Packs/day: 0.25    Years: 25.00    Pack years: 6.25    Types: Cigarettes    Last attempt to quit: 02/13/2019    Years since quitting: 0.2  . Smokeless tobacco: Never Used  . Tobacco comment: a couple drags of one cigarettes  Substance Use Topics  . Alcohol use: Not Currently    Subjective:     I connected with Jana HakimMichelle L Scheirer on 05/07/19 at 10:20 AM EDT by a video enabled telemedicine application and verified that I am speaking with the correct person using two identifiers. Patient and I are the only 2 people on the video call.    I discussed the limitations of evaluation and management by telemedicine and the availability of in person appointments. The patient expressed understanding and agreed to proceed.  Concerns for possible UTI; burning with urination/ blood in urine; symptoms started 3 days ago; burning at  the end of the stream; no fever; not prone to UTI or kidney stones; Is not checking her blood sugar regularly- does not have access to health insurance or meter at this time; cost prohibitive; does not feel that her sugar is elevated at this time- "not seeing my normal signs that mean my blood sugar is high."    Objective:  There were no vitals filed for this visit.  General: Well developed, well nourished, in no acute distress  Head: Normocephalic and atraumatic  Lungs: Respirations unlabored; wearing oxygen today Neurologic: Alert and oriented; speech intact; face symmetrical;   Assessment:  1. Urinary tract infection with hematuria, site unspecified     Plan:  Suspect UTI based on symptoms; if no improvement within 24 hours, she is to call back and will schedule CT to look for kidney stone; increase fluids as tolerated specifically water; follow-up worse, no better.   No follow-ups on file.  No orders of the defined types were placed in this encounter.   Requested Prescriptions   Signed Prescriptions Disp Refills  . nitrofurantoin, macrocrystal-monohydrate, (MACROBID) 100 MG capsule 14 capsule 0    Sig: Take 1 capsule (100 mg total) by mouth 2 (two) times daily.

## 2019-05-22 ENCOUNTER — Other Ambulatory Visit: Payer: Self-pay | Admitting: Internal Medicine

## 2019-05-22 DIAGNOSIS — E1142 Type 2 diabetes mellitus with diabetic polyneuropathy: Secondary | ICD-10-CM

## 2019-05-25 ENCOUNTER — Other Ambulatory Visit: Payer: Self-pay | Admitting: Internal Medicine

## 2019-05-25 NOTE — Telephone Encounter (Signed)
Done erx 

## 2019-05-30 ENCOUNTER — Other Ambulatory Visit: Payer: Self-pay | Admitting: Internal Medicine

## 2019-06-13 ENCOUNTER — Ambulatory Visit: Payer: Self-pay | Admitting: *Deleted

## 2019-06-13 DIAGNOSIS — Z20822 Contact with and (suspected) exposure to covid-19: Secondary | ICD-10-CM

## 2019-06-13 NOTE — Telephone Encounter (Signed)
Pt calling stating that she had a low grade temp of 99 today but now her temperature is 101.8. Pt states that she has not taken any over the counter medication. Pt states that she is not experiencing any difficulty breathing but she has been feeling tired andy "yucky" today. Pt states she has has experienced a little headache, runny nose and cough which is unusual for her due to hx of COPD. Pt given home care advice to treat fever and advised to isolate at home with current symptoms. Pt advised to seek treatment in the ED if she becomes worse or if fever is over 103 F. Pt verbalized understanding. Pt can be contacted at  [phone]. Reason for Disposition . [1] COVID-19 infection suspected by caller or triager AND [2] mild symptoms (cough, fever, or others) AND [3] no complications or SOB  Answer Assessment - Initial Assessment Questions 1. CLOSE CONTACT: "Who is the person with the confirmed or suspected COVID-19 infection that you were exposed to?"     No contact 2. PLACE of CONTACT: "Where were you when you were exposed to COVID-19?" (e.g., home, school, medical waiting room; which city?)     n/a 3. TYPE of CONTACT: "How much contact was there?" (e.g., sitting next to, live in same house, work in same office, same building)     n/a 4. DURATION of CONTACT: "How long were you in contact with the COVID-19 patient?" (e.g., a few seconds, passed by person, a few minutes, live with the patient)     n/a 5. DATE of CONTACT: "When did you have contact with a COVID-19 patient?" (e.g., how many days ago)     n/a 6. TRAVEL: "Have you traveled out of the country recently?" If so, "When and where?"     * Also ask about out-of-state travel, since the CDC has identified some high-risk cities for community spread in the [place].     * Note: Travel becomes less relevant if there is widespread community transmission where the patient lives.     no 7. COMMUNITY SPREAD: "Are there lots of cases of COVID-19 (community  spread) where you live?" (See public health department website, if unsure)       yes 8. SYMPTOMS: "Do you have any symptoms?" (e.g., fever, cough, breathing difficulty)     fever 9. PREGNANCY OR POSTPARTUM: "Is there any chance you are pregnant?" "When was your last menstrual period?" "Did you deliver in the last 2 weeks?"     Not assesed 10. HIGH RISK: "Do you have any heart or lung problems? Do you have a weak immune system?" (e.g., CHF, COPD, asthma, HIV positive, chemotherapy, renal failure, diabetes mellitus, sickle cell anemia)       COPD, CHF and DM  Answer Assessment - Initial Assessment Questions 1. COVID-19 DIAGNOSIS: "Who made your Coronavirus (COVID-19) diagnosis?" "Was it confirmed by a positive lab test?" If not diagnosed by a HCP, ask "Are there lots of cases (community spread) where you live?" (See public health department website, if unsure)     No diagnosis, community spread 2. ONSET: "When did the COVID-19 symptoms start?"      today 3. WORST SYMPTOM: "What is your worst symptom?" (e.g., cough, fever, shortness of breath, muscle aches)     fever 4. COUGH: "Do you have a cough?" If so, ask: "How bad is the cough?"       Yes but has hx of COPD 5. FEVER: "Do you have a fever?" If so, ask: "What  is your temperature, how was it measured, and when did it start?"     Yes now it is 101.8, occurred off and on today 6. RESPIRATORY STATUS: "Describe your breathing?" (e.g., shortness of breath, wheezing, unable to speak)      no 7. BETTER-SAME-WORSE: "Are you getting better, staying the same or getting worse compared to yesterday?"  If getting worse, ask, "In what way?"     Yes, feels yucky and tired  8. HIGH RISK DISEASE: "Do you have any chronic medical problems?" (e.g., asthma, heart or lung disease, weak immune system, etc.)     COPD,CHF and DM 9. PREGNANCY: "Is there any chance you are pregnant?" "When was your last menstrual period?"     Not assessed 10. OTHER SYMPTOMS: "Do  you have any other symptoms?"  (e.g., chills, fatigue, headache, loss of smell or taste, muscle pain, sore throat)       Nausea due to taste in my mouth  Protocols used: CORONAVIRUS (COVID-19) DIAGNOSED OR SUSPECTED-A-AH, CORONAVIRUS (COVID-19) EXPOSURE-A-AH

## 2019-06-13 NOTE — Telephone Encounter (Signed)
Tried calling patient to make appt and no VM set up

## 2019-06-14 NOTE — Telephone Encounter (Signed)
Ok for referral for testing 

## 2019-06-14 NOTE — Telephone Encounter (Signed)
Noted. Will send to PCP. Ok to route for testing?

## 2019-06-14 NOTE — Telephone Encounter (Signed)
I think [facility] meant [facility]

## 2019-06-14 NOTE — Addendum Note (Signed)
Addended by: Denyce Robert on: 06/14/2019 02:38 PM   Modules accepted: Orders

## 2019-06-14 NOTE — Telephone Encounter (Signed)
Phone call to patient to schedule COVID 19 test.  No answer, voicemail box not set up, unable to leave message.  Order placed.

## 2019-06-14 NOTE — Telephone Encounter (Signed)
Please contact pt for covid testing. Thanks! 

## 2019-06-15 NOTE — Telephone Encounter (Signed)
Noted  

## 2019-06-15 NOTE — Telephone Encounter (Signed)
Pt called to schedule COVID-19 testing but pt states she does not want to have testing at this time. Pt states that she no longer has a fever and her appetite is back. Pt states she thinks it was a summer cold. Pt advised to notify PCP if she changes her mind regarding COVID-19 testing. Understanding verbalized.

## 2019-06-15 NOTE — Addendum Note (Signed)
Addended by: Dimple Nanas on: 06/15/2019 04:00 PM   Modules accepted: Orders

## 2019-07-17 ENCOUNTER — Other Ambulatory Visit: Payer: Self-pay | Admitting: Internal Medicine

## 2019-07-17 MED ORDER — SPIRIVA RESPIMAT 1.25 MCG/ACT IN AERS: 2.0000 | INHALATION_SPRAY | Freq: Every day | RESPIRATORY_TRACT | 5 refills | Status: AC

## 2019-07-17 MED ORDER — BUDESONIDE-FORMOTEROL FUMARATE 160-4.5 MCG/ACT IN AERO: INHALATION_SPRAY | RESPIRATORY_TRACT | 2 refills | Status: AC

## 2019-07-17 MED ORDER — ALBUTEROL SULFATE HFA 108 (90 BASE) MCG/ACT IN AERS: INHALATION_SPRAY | RESPIRATORY_TRACT | 2 refills | Status: DC

## 2019-07-29 ENCOUNTER — Other Ambulatory Visit: Payer: Self-pay | Admitting: Internal Medicine

## 2019-08-09 ENCOUNTER — Ambulatory Visit: Payer: 59 | Admitting: Internal Medicine

## 2019-08-14 ENCOUNTER — Other Ambulatory Visit: Payer: Self-pay | Admitting: Internal Medicine

## 2019-08-14 NOTE — Telephone Encounter (Signed)
Done erx 

## 2019-08-28 ENCOUNTER — Encounter: Payer: Self-pay | Admitting: Gynecology

## 2019-10-29 ENCOUNTER — Other Ambulatory Visit: Payer: Self-pay | Admitting: Internal Medicine

## 2019-10-30 NOTE — Telephone Encounter (Signed)
Done erx 

## 2019-11-22 ENCOUNTER — Telehealth: Payer: Self-pay | Admitting: Internal Medicine

## 2019-11-22 DIAGNOSIS — E1142 Type 2 diabetes mellitus with diabetic polyneuropathy: Secondary | ICD-10-CM

## 2019-11-22 MED ORDER — GABAPENTIN 300 MG PO CAPS: 600.0000 mg | ORAL_CAPSULE | Freq: Three times a day (TID) | ORAL | 1 refills | Status: AC

## 2019-11-22 MED ORDER — ALBUTEROL SULFATE HFA 108 (90 BASE) MCG/ACT IN AERS: INHALATION_SPRAY | RESPIRATORY_TRACT | 5 refills | Status: AC

## 2019-11-22 NOTE — Addendum Note (Signed)
Addended by: Biagio Borg on: 11/22/2019 04:22 PM   Modules accepted: Orders

## 2019-11-22 NOTE — Telephone Encounter (Signed)
rx refill gabapentin (NEURONTIN) 300 MG capsule  albuterol (VENTOLIN HFA) 108 (90 Base) MCG/ACT inhaler  [facility] [address], [place], [place] [address]  Patient states health and human services needs faxed prescription  Call back [phone]

## 2019-11-22 NOTE — Telephone Encounter (Signed)
Done  Please keep this for future as I dont want to do this again when the fax does not work again

## 2019-11-23 NOTE — Telephone Encounter (Signed)
faxed

## 2019-12-13 ENCOUNTER — Other Ambulatory Visit: Payer: Self-pay

## 2019-12-13 ENCOUNTER — Ambulatory Visit (INDEPENDENT_AMBULATORY_CARE_PROVIDER_SITE_OTHER): Payer: Self-pay | Admitting: Adult Health

## 2019-12-13 ENCOUNTER — Encounter: Payer: Self-pay | Admitting: Adult Health

## 2019-12-13 DIAGNOSIS — J9612 Chronic respiratory failure with hypercapnia: Secondary | ICD-10-CM

## 2019-12-13 DIAGNOSIS — G4733 Obstructive sleep apnea (adult) (pediatric): Secondary | ICD-10-CM

## 2019-12-13 DIAGNOSIS — J449 Chronic obstructive pulmonary disease, unspecified: Secondary | ICD-10-CM

## 2019-12-13 DIAGNOSIS — I5032 Chronic diastolic (congestive) heart failure: Secondary | ICD-10-CM

## 2019-12-13 DIAGNOSIS — J9611 Chronic respiratory failure with hypoxia: Secondary | ICD-10-CM

## 2019-12-13 MED ORDER — POTASSIUM CHLORIDE ER 10 MEQ PO TBCR: 10.0000 meq | EXTENDED_RELEASE_TABLET | ORAL | 0 refills | Status: AC

## 2019-12-13 MED ORDER — PREDNISONE 20 MG PO TABS: 20.0000 mg | ORAL_TABLET | Freq: Every day | ORAL | 0 refills | Status: DC

## 2019-12-13 NOTE — Addendum Note (Signed)
Addended by: Boone Master E on: 12/13/2019 04:43 PM   Modules accepted: Orders

## 2019-12-13 NOTE — Progress Notes (Signed)
@Patient  ID: Mary [name], female    DOB: 07/28/1976, 44 y.o.   MRN: [id number]  Chief Complaint  Patient presents with  . Follow-up    COPD     Referring provider: Biagio Borg, MD  HPI: 44 year old female with severe COPD and chronic hypoxic and hypercarbic respiratory failure on home oxygen and nocturnal BiPAP Previous evaluation at [facility] for lung transplant 2017 was deemed not a candidate at that time  TEST/EVENTS :  Last ABG 04/26/2017 was 7.38/73/91 on bilevel and 7.39/85/55 on 4 L oxygen  5/2013PFTsFEV1 24%, ratio 48, positive bronchodilator response with a 31% change, FVC 42% DLCO 32% 2013 Alpha 1 MM 340  HRCT Chest 01/2017 >no ILD , RUL nodule -ggo 2.1cm stable since 2016 (slightly enlarged from 2015)  Echo EF 65%, Gr 1 DD , PAP 33mmHg. NPSG4/2013 >AHI 4.6/hr , O2 desats 73%  CPAP 04/2017 19/11 + 4L O2, severe REM desatns  Echo 01/2017-normal LVEF, RV dilated with RVSP 42  12/13/2019 Follow up : OSA , COPD  Patient returns for a follow-up.  She was last seen March 2020.  Patient has underlying COPD.  She remains on Symbicort and Spiriva.  She says overall breathing is doing the same. Has noticed that breathing not as good the last few days with decreased activity tolerance . Feels she is retaining fluid, weight is up . Not as active with pandemic .  Is getting her meds thru community and drug assistance program.  Gets winded with minimal activity .  No fever. No discolored mucus . Took left over prednisone 5mg  daily for last 2 days, felt it has helped some.   Patient is on chronic oxygen at 3 L.  She denies any increased oxygen demands .  She has underlying OSA and OHS.  She is on nocturnal BiPAP with oxygen.  Says she is doing well on BiPAP.  Denies any known issues.  BiPAP download shows excellent compliance with 100% usage.  Daily average usage at 15 hours.  IPAP 20 EPAP 10 cm H2O.  AHI 2.8    Allergies  Allergen Reactions  . Bee Venom Swelling    Pt  states she swells up 3x normal size. If stung "2-3 times, my throat could close up"    Immunization History  Administered Date(s) Administered  . Influenza Split 01/06/2011, 10/20/2012, 09/06/2013  . Influenza, High Dose Seasonal PF 07/29/2016  . Influenza,inj,Quad PF,6+ Mos 09/27/2014, 09/16/2015, 09/16/2017, 08/30/2018  . Pneumococcal Conjugate-13 09/16/2015  . Pneumococcal Polysaccharide-23 01/06/2011, 02/24/2018  . Tdap 09/06/2013    Past Medical History:  Diagnosis Date  . Asthma   . Axillary adenopathy    right axillary adenopathy noted on CT chest (03/06/2012)  . Chronic right-sided CHF (congestive heart failure) ([place]) 03/23/2012   with cor pulmonale. Last RHC 03/2012  . COPD (chronic obstructive pulmonary disease) ([place])    PFTS 03/08/12: fev1 0.58L/1%, FVC 1.18/33%, Ratop 49 and c/w ssevere obstruction. 21% BD response on FVC, RV 219%, DLCO 11/54%  . Diabetes mellitus without complication ([place])   . DVT of upper extremity (deep vein thrombosis) [facility]) April 2013   right subclavian // Unclear precipitating cause - possibly significant right heart failure, with vascular stasis potentially predisposing to clotting.    . Exertional shortness of breath   . Hepatic cirrhosis ([place])    Questionable history of - Noted on CT abdomen (03/2012) - thought to be due to vascular congestion from right heart failure +/- patient's history of alcohol abuse  .  History of alcoholism (HCC)   . History of chronic bronchitis   . Irregular menses   . Migraine    "~ 1/yr" (05/03/2013)  . Moderate to severe pulmonary hypertension [facility]) April 2013   Cardiac cath on 03/06/12 - 1. Elevated pulmonary artery pressures, right sided filling pressures.,  2. PA: 64/45 (mean 53)    . On home oxygen therapy    "2-3 L 24/7" (05/03/2013)  . OSA (obstructive sleep apnea)    wears noctural BiPAP (05/03/2013)  . Periodontal disease   . Pneumonia ~ 1985; 01/2011  . Pulmonary embolus [facility]) April 2013   Precipitating cause  unclear. Was treated with coumadin from April-June 2013.  . Pulmonary hypertension (HCC)     Tobacco History: Social History   Tobacco Use  Smoking Status Former Smoker  . Packs/day: 0.25  . Years: 25.00  . Pack years: 6.25  . Types: Cigarettes  . Quit date: 02/13/2019  . Years since quitting: 0.8  Smokeless Tobacco Never Used  Tobacco Comment   a couple drags of one cigarettes   Counseling given: Not Answered Comment: a couple drags of one cigarettes   Outpatient Medications Prior to Visit  Medication Sig Dispense Refill  . albuterol (VENTOLIN HFA) 108 (90 Base) MCG/ACT inhaler USE 2 PUFFS EVERY 6 HOURS AS NEEDED FOR WHEEZING 18 g 5  . budesonide-formoterol (SYMBICORT) 160-4.5 MCG/ACT inhaler INHALE 2 PUFFS BY MOUTH INTO THE LUNGS DAILY 30.6 Inhaler 2  . gabapentin (NEURONTIN) 300 MG capsule Take 2 capsules (600 mg total) by mouth 3 (three) times daily. 540 capsule 1  . glucose blood (ONE TOUCH ULTRA TEST) test strip Use as instructed four times per day E11.9 400 each 12  . HUMALOG KWIKPEN 100 UNIT/ML KwikPen Inject 0.15 mLs (15 Units total) into the skin 3 (three) times daily with meals. Restart after finished with 70/30 insulin 15 mL 11  . Insulin Glargine (BASAGLAR KWIKPEN) 100 UNIT/ML SOPN Inject 0.25 mLs (25 Units total) into the skin daily at 10 pm. E11.9  Restart after finished with 70/30 insulin (Patient taking differently: Inject 20 Units into the skin daily at 10 pm. E11.9  Restart after finished with 70/30 insulin) 5 pen 11  . Insulin Pen Needle (BD PEN NEEDLE NANO U/F) 32G X 4 MM MISC Use as directed four times per day daily E11.9 400 each 3  . ipratropium-albuterol (DUONEB) 0.5-2.5 (3) MG/3ML SOLN TAKE 3 MLS BY NEBULIZATION EVERY 4 (FOUR) HOURS AS NEEDED. 360 mL 0  . nitrofurantoin, macrocrystal-monohydrate, (MACROBID) 100 MG capsule Take 1 capsule (100 mg total) by mouth 2 (two) times daily. 14 capsule 0  . pantoprazole (PROTONIX) 40 MG tablet Take 1 tablet (40 mg  total) by mouth 2 (two) times daily before a meal. 60 tablet 0  . pregabalin (LYRICA) 50 MG capsule Take 1 capsule (50 mg total) by mouth 2 (two) times daily. 60 capsule 3  . Tiotropium Bromide Monohydrate (SPIRIVA RESPIMAT) 1.25 MCG/ACT AERS Inhale 2 puffs into the lungs daily. 4 g 5  . torsemide (DEMADEX) 20 MG tablet TAKE 1 TABLET BY MOUTH EVERY DAY 30 tablet 2  . traMADol (ULTRAM) 50 MG tablet TAKE 1 TABLET BY MOUTH EVERY 6 HOURS AS NEEDED 120 tablet 2  . HYDROcodone-acetaminophen (NORCO) 5-325 MG tablet Take 1 tablet by mouth every 6 (six) hours as needed for moderate pain. (Patient not taking: Reported on 12/13/2019) 30 tablet 0   No facility-administered medications prior to visit.     Review of Systems:  Constitutional:   No  weight loss, night sweats,  Fevers, chills,  +fatigue, or  lassitude.  HEENT:   No headaches,  Difficulty swallowing,  Tooth/dental problems, or  Sore throat,                No sneezing, itching, ear ache, nasal congestion, post nasal drip,   CV:  No chest pain,  Orthopnea, PND,  anasarca, dizziness, palpitations, syncope.   GI  No heartburn, indigestion, abdominal pain, nausea, vomiting, diarrhea, change in bowel habits, loss of appetite, bloody stools.   Resp:    No chest wall deformity  Skin: no rash or lesions.  GU: no dysuria, change in color of urine, no urgency or frequency.  No flank pain, no hematuria   MS:  No joint pain or swelling.  No decreased range of motion.  No back pain.    Physical Exam  BP 124/74 (BP Location: Left Arm, Cuff Size: Normal)   Pulse (!) 102   Temp 98 F (36.7 C) (Temporal)   Ht 5' 2.5" (1.588 m)   Wt 221 lb 9.6 oz (100.5 kg)   SpO2 98% Comment: 3L  BMI 39.89 kg/m   GEN: A/Ox3; pleasant , NAD, BMI 39   HEENT:  [place]/AT,  OSE-clear, THROAT-clear, no lesions, no postnasal drip or exudate noted.   NECK:  Supple w/ fair ROM; no JVD; normal carotid impulses w/o bruits; no thyromegaly or nodules palpated; no  lymphadenopathy.    RESP  Decreased BS in bases Clear  P & A; w/o, wheezes/ rales/ or rhonchi. no accessory muscle use, no dullness to percussion  CARD:  RRR, no m/r/g, no peripheral edema, pulses intact, no cyanosis or clubbing.  GI:   Soft & nt; nml bowel sounds; no organomegaly or masses detected.   Musco: Warm bil, no deformities or joint swelling noted.   Neuro: alert, no focal deficits noted.    Skin: Warm, no lesions or rashes    Lab Results:   BNP    Component Value Date/Time   BNP 58.0 12/28/2018 0757    ProBNP    Component Value Date/Time   PROBNP 129.0 (H) 04/22/2017 1153    Imaging: No results found.    No flowsheet data found.  No results found for: NITRICOXIDE      Assessment & Plan:   COPD (chronic obstructive pulmonary disease) (HCC) Mild exacerbation -  Treat with short course of prednisone . May have some mild volume overload  Discussed chest xray and labs today - she wants to hold off due to no insurance.   Plan  Patient Instructions  Prednisone 20mg  daily for 3 days.  Extra Torsemide 20mg  tomorrow for 1 day Continue on Symbicort  and Spiriva . , rinse after use .  Continue on Oxygen 3l/m .  Continue on BIPAP At bedtime with oxygen . And with naps.  Follow up Dr.  in 3 months and As needed   Please contact office for sooner follow up if symptoms do not improve or worsen or seek emergency care            OSA on BiPAP Excellent control and compliance on BiPAP  Plan  Patient Instructions  Prednisone 20mg  daily for 3 days.  Extra Torsemide 20mg  tomorrow for 1 day Continue on Symbicort  and Spiriva . , rinse after use .  Continue on Oxygen 3l/m .  Continue on BIPAP At bedtime with oxygen . And with naps.  Follow up Dr.  in 3 months and As needed   Please contact office for sooner follow up if symptoms do not improve or worsen or seek emergency care            Chronic diastolic heart failure  (HCC) Mild decompensation we will use extra torsemide x1 dose tomorrow.  We will refill her potassium.  Patient is to follow-up with cardiology as planned  Chronic respiratory failure with hypoxia and hypercapnia (HCC) Continue on oxygen at 3 L.  O2 saturation goals greater than 90%.  Total patient care time 31 minutes   Taylormarie Register, NP 12/13/2019

## 2019-12-13 NOTE — Patient Instructions (Addendum)
Prednisone 20mg  daily for 3 days.  Extra Torsemide 20mg  tomorrow for 1 day Continue on Symbicort  and Spiriva . , rinse after use .  Continue on Oxygen 3l/m .  Continue on BIPAP At bedtime with oxygen . And with naps.  Follow up Dr.  in 3 months and As needed   Please contact office for sooner follow up if symptoms do not improve or worsen or seek emergency care

## 2019-12-13 NOTE — Assessment & Plan Note (Signed)
Mild decompensation we will use extra torsemide x1 dose tomorrow.  We will refill her potassium.  Patient is to follow-up with cardiology as planned

## 2019-12-13 NOTE — Assessment & Plan Note (Signed)
Excellent control and compliance on BiPAP  Plan  Patient Instructions  Prednisone 20mg  daily for 3 days.  Extra Torsemide 20mg  tomorrow for 1 day Continue on Symbicort  and Spiriva . , rinse after use .  Continue on Oxygen 3l/m .  Continue on BIPAP At bedtime with oxygen . And with naps.  Follow up Dr.  in 3 months and As needed   Please contact office for sooner follow up if symptoms do not improve or worsen or seek emergency care

## 2019-12-13 NOTE — Assessment & Plan Note (Signed)
Mild exacerbation -  Treat with short course of prednisone . May have some mild volume overload  Discussed chest xray and labs today - she wants to hold off due to no insurance.   Plan  Patient Instructions  Prednisone 20mg  daily for 3 days.  Extra Torsemide 20mg  tomorrow for 1 day Continue on Symbicort  and Spiriva . , rinse after use .  Continue on Oxygen 3l/m .  Continue on BIPAP At bedtime with oxygen . And with naps.  Follow up Dr.  in 3 months and As needed   Please contact office for sooner follow up if symptoms do not improve or worsen or seek emergency care

## 2019-12-13 NOTE — Assessment & Plan Note (Signed)
Continue on oxygen at 3 L.  O2 saturation goals greater than 90%.

## 2019-12-28 ENCOUNTER — Emergency Department (HOSPITAL_COMMUNITY): Payer: Medicaid Other

## 2019-12-28 ENCOUNTER — Inpatient Hospital Stay (HOSPITAL_COMMUNITY)
Admission: EM | Admit: 2019-12-28 | Discharge: 2019-12-31 | DRG: 190 | Disposition: A | Discharge status: Home / Self Care | Payer: Medicaid Other | Attending: Family Medicine | Admitting: Family Medicine

## 2019-12-28 ENCOUNTER — Observation Stay (HOSPITAL_BASED_OUTPATIENT_CLINIC_OR_DEPARTMENT_OTHER): Payer: Medicaid Other

## 2019-12-28 ENCOUNTER — Other Ambulatory Visit: Payer: Self-pay

## 2019-12-28 ENCOUNTER — Encounter (HOSPITAL_COMMUNITY): Payer: Self-pay

## 2019-12-28 DIAGNOSIS — Z8701 Personal history of pneumonia (recurrent): Secondary | ICD-10-CM

## 2019-12-28 DIAGNOSIS — Z20822 Contact with and (suspected) exposure to covid-19: Secondary | ICD-10-CM | POA: Diagnosis present

## 2019-12-28 DIAGNOSIS — Z87891 Personal history of nicotine dependence: Secondary | ICD-10-CM | POA: Diagnosis not present

## 2019-12-28 DIAGNOSIS — Z79891 Long term (current) use of opiate analgesic: Secondary | ICD-10-CM | POA: Diagnosis not present

## 2019-12-28 DIAGNOSIS — E872 Acidosis: Secondary | ICD-10-CM | POA: Diagnosis present

## 2019-12-28 DIAGNOSIS — Z8249 Family history of ischemic heart disease and other diseases of the circulatory system: Secondary | ICD-10-CM

## 2019-12-28 DIAGNOSIS — Z794 Long term (current) use of insulin: Secondary | ICD-10-CM | POA: Diagnosis not present

## 2019-12-28 DIAGNOSIS — J9621 Acute and chronic respiratory failure with hypoxia: Secondary | ICD-10-CM | POA: Diagnosis present

## 2019-12-28 DIAGNOSIS — Z9981 Dependence on supplemental oxygen: Secondary | ICD-10-CM

## 2019-12-28 DIAGNOSIS — E662 Morbid (severe) obesity with alveolar hypoventilation: Secondary | ICD-10-CM | POA: Diagnosis present

## 2019-12-28 DIAGNOSIS — Z86718 Personal history of other venous thrombosis and embolism: Secondary | ICD-10-CM | POA: Diagnosis not present

## 2019-12-28 DIAGNOSIS — I2729 Other secondary pulmonary hypertension: Secondary | ICD-10-CM | POA: Diagnosis present

## 2019-12-28 DIAGNOSIS — R162 Hepatomegaly with splenomegaly, not elsewhere classified: Secondary | ICD-10-CM | POA: Diagnosis present

## 2019-12-28 DIAGNOSIS — G4733 Obstructive sleep apnea (adult) (pediatric): Secondary | ICD-10-CM | POA: Diagnosis present

## 2019-12-28 DIAGNOSIS — K7460 Unspecified cirrhosis of liver: Secondary | ICD-10-CM | POA: Diagnosis present

## 2019-12-28 DIAGNOSIS — Z7951 Long term (current) use of inhaled steroids: Secondary | ICD-10-CM | POA: Diagnosis not present

## 2019-12-28 DIAGNOSIS — J9622 Acute and chronic respiratory failure with hypercapnia: Secondary | ICD-10-CM | POA: Diagnosis present

## 2019-12-28 DIAGNOSIS — J441 Chronic obstructive pulmonary disease with (acute) exacerbation: Principal | ICD-10-CM | POA: Diagnosis present

## 2019-12-28 DIAGNOSIS — Z6839 Body mass index (BMI) 39.0-39.9, adult: Secondary | ICD-10-CM | POA: Diagnosis not present

## 2019-12-28 DIAGNOSIS — R945 Abnormal results of liver function studies: Secondary | ICD-10-CM | POA: Diagnosis present

## 2019-12-28 DIAGNOSIS — I2781 Cor pulmonale (chronic): Secondary | ICD-10-CM | POA: Diagnosis present

## 2019-12-28 DIAGNOSIS — D696 Thrombocytopenia, unspecified: Secondary | ICD-10-CM | POA: Diagnosis present

## 2019-12-28 DIAGNOSIS — E1142 Type 2 diabetes mellitus with diabetic polyneuropathy: Secondary | ICD-10-CM

## 2019-12-28 DIAGNOSIS — Z720 Tobacco use: Secondary | ICD-10-CM | POA: Diagnosis present

## 2019-12-28 DIAGNOSIS — F419 Anxiety disorder, unspecified: Secondary | ICD-10-CM | POA: Diagnosis present

## 2019-12-28 DIAGNOSIS — R9431 Abnormal electrocardiogram [ECG] [EKG]: Secondary | ICD-10-CM

## 2019-12-28 DIAGNOSIS — E1140 Type 2 diabetes mellitus with diabetic neuropathy, unspecified: Secondary | ICD-10-CM | POA: Diagnosis present

## 2019-12-28 DIAGNOSIS — R0902 Hypoxemia: Secondary | ICD-10-CM | POA: Diagnosis present

## 2019-12-28 DIAGNOSIS — I272 Pulmonary hypertension, unspecified: Secondary | ICD-10-CM | POA: Diagnosis present

## 2019-12-28 DIAGNOSIS — Z91030 Bee allergy status: Secondary | ICD-10-CM

## 2019-12-28 DIAGNOSIS — Z7952 Long term (current) use of systemic steroids: Secondary | ICD-10-CM

## 2019-12-28 DIAGNOSIS — Z86711 Personal history of pulmonary embolism: Secondary | ICD-10-CM | POA: Diagnosis not present

## 2019-12-28 DIAGNOSIS — Z811 Family history of alcohol abuse and dependence: Secondary | ICD-10-CM | POA: Diagnosis not present

## 2019-12-28 DIAGNOSIS — Z79899 Other long term (current) drug therapy: Secondary | ICD-10-CM

## 2019-12-28 DIAGNOSIS — Z9049 Acquired absence of other specified parts of digestive tract: Secondary | ICD-10-CM

## 2019-12-28 DIAGNOSIS — F17210 Nicotine dependence, cigarettes, uncomplicated: Secondary | ICD-10-CM | POA: Diagnosis present

## 2019-12-28 DIAGNOSIS — R7989 Other specified abnormal findings of blood chemistry: Secondary | ICD-10-CM | POA: Diagnosis present

## 2019-12-28 DIAGNOSIS — K761 Chronic passive congestion of liver: Secondary | ICD-10-CM | POA: Diagnosis present

## 2019-12-28 DIAGNOSIS — E1169 Type 2 diabetes mellitus with other specified complication: Secondary | ICD-10-CM | POA: Diagnosis present

## 2019-12-28 LAB — BLOOD GAS, ARTERIAL
Acid-Base Excess: 13.8 mmol/L — ABNORMAL HIGH (ref 0.0–2.0)
Bicarbonate: 34.9 mmol/L — ABNORMAL HIGH (ref 20.0–28.0)
FIO2: 36
O2 Saturation: 89.5 %
Patient temperature: 37
pCO2 arterial: 75.8 mmHg (ref 32.0–48.0)
pH, Arterial: 7.342 — ABNORMAL LOW (ref 7.350–7.450)
pO2, Arterial: 61.3 mmHg — ABNORMAL LOW (ref 83.0–108.0)

## 2019-12-28 LAB — CBG MONITORING, ED
Glucose-Capillary: 426 mg/dL — ABNORMAL HIGH (ref 70–99)
Glucose-Capillary: 360 mg/dL — ABNORMAL HIGH (ref 70–99)

## 2019-12-28 LAB — CBC WITH DIFFERENTIAL/PLATELET
Abs Immature Granulocytes: 0.05 10*3/uL (ref 0.00–0.07)
Basophils Absolute: 0 10*3/uL (ref 0.0–0.1)
Basophils Relative: 0 %
Eosinophils Absolute: 0.2 10*3/uL (ref 0.0–0.5)
Eosinophils Relative: 2 %
HCT: 40.8 % (ref 36.0–46.0)
Hemoglobin: 13.2 g/dL (ref 12.0–15.0)
Immature Granulocytes: 1 %
Lymphocytes Relative: 27 %
Lymphs Abs: 2.1 10*3/uL (ref 0.7–4.0)
MCH: 34.1 pg — ABNORMAL HIGH (ref 26.0–34.0)
MCHC: 32.4 g/dL (ref 30.0–36.0)
MCV: 105.4 fL — ABNORMAL HIGH (ref 80.0–100.0)
Monocytes Absolute: 0.6 10*3/uL (ref 0.1–1.0)
Monocytes Relative: 8 %
Neutro Abs: 4.8 10*3/uL (ref 1.7–7.7)
Neutrophils Relative %: 62 %
Platelets: 111 10*3/uL — ABNORMAL LOW (ref 150–400)
RBC: 3.87 MIL/uL (ref 3.87–5.11)
RDW: 14.6 % (ref 11.5–15.5)
WBC: 7.7 10*3/uL (ref 4.0–10.5)
nRBC: 0 % (ref 0.0–0.2)

## 2019-12-28 LAB — PROCALCITONIN: Procalcitonin: <0.1 ng/mL

## 2019-12-28 LAB — BLOOD GAS, VENOUS
Acid-Base Excess: 13.6 mmol/L — ABNORMAL HIGH (ref 0.0–2.0)
Bicarbonate: 34.5 mmol/L — ABNORMAL HIGH (ref 20.0–28.0)
FIO2: 36
O2 Saturation: 99.3 %
Patient temperature: 37
pCO2, Ven: 84.8 mmHg (ref 44.0–60.0)
pH, Ven: 7.299 (ref 7.250–7.430)
pO2, Ven: 129 mmHg — ABNORMAL HIGH (ref 32.0–45.0)

## 2019-12-28 LAB — HEMOGLOBIN A1C
Hgb A1c MFr Bld: 6.9 % — ABNORMAL HIGH (ref 4.8–5.6)
Mean Plasma Glucose: 151.33 mg/dL

## 2019-12-28 LAB — COMPREHENSIVE METABOLIC PANEL
ALT: 24 U/L (ref 0–44)
AST: 55 U/L — ABNORMAL HIGH (ref 15–41)
Albumin: 3.5 g/dL (ref 3.5–5.0)
Alkaline Phosphatase: 131 U/L — ABNORMAL HIGH (ref 38–126)
Anion gap: 11 (ref 5–15)
BUN: 6 mg/dL (ref 6–20)
CO2: 37 mmol/L — ABNORMAL HIGH (ref 22–32)
Calcium: 8.6 mg/dL — ABNORMAL LOW (ref 8.9–10.3)
Chloride: 86 mmol/L — ABNORMAL LOW (ref 98–111)
Creatinine, Ser: 0.51 mg/dL (ref 0.44–1.00)
GFR calc Af Amer: >60 mL/min (ref >60)
GFR calc non Af Amer: >60 mL/min (ref >60)
Glucose, Bld: 260 mg/dL — ABNORMAL HIGH (ref 70–99)
Potassium: 3.5 mmol/L (ref 3.5–5.1)
Sodium: 134 mmol/L — ABNORMAL LOW (ref 135–145)
Total Bilirubin: 1.5 mg/dL — ABNORMAL HIGH (ref 0.3–1.2)
Total Protein: 8.2 g/dL — ABNORMAL HIGH (ref 6.5–8.1)

## 2019-12-28 LAB — PHOSPHORUS: Phosphorus: 4 mg/dL (ref 2.5–4.6)

## 2019-12-28 LAB — C-REACTIVE PROTEIN: CRP: 2.3 mg/dL — ABNORMAL HIGH (ref <1.0)

## 2019-12-28 LAB — PROTIME-INR
INR: 1 (ref 0.8–1.2)
Prothrombin Time: 13.3 seconds (ref 11.4–15.2)

## 2019-12-28 LAB — GLUCOSE, CAPILLARY
Glucose-Capillary: 327 mg/dL — ABNORMAL HIGH (ref 70–99)
Glucose-Capillary: 454 mg/dL — ABNORMAL HIGH (ref 70–99)

## 2019-12-28 LAB — POC URINE PREG, ED: Preg Test, Ur: NEGATIVE

## 2019-12-28 LAB — ECHOCARDIOGRAM COMPLETE
Height: 62.5 in
Weight: 3520 oz

## 2019-12-28 LAB — MAGNESIUM: Magnesium: 1.7 mg/dL (ref 1.7–2.4)

## 2019-12-28 LAB — LACTIC ACID, PLASMA
Lactic Acid, Venous: 2.6 mmol/L (ref 0.5–1.9)
Lactic Acid, Venous: 1.2 mmol/L (ref 0.5–1.9)

## 2019-12-28 LAB — TRIGLYCERIDES: Triglycerides: 210 mg/dL — ABNORMAL HIGH (ref <150)

## 2019-12-28 LAB — D-DIMER, QUANTITATIVE: D-Dimer, Quant: 7.27 ug/mL-FEU — ABNORMAL HIGH (ref 0.00–0.50)

## 2019-12-28 LAB — RESPIRATORY PANEL BY RT PCR (FLU A&B, COVID)
Influenza A by PCR: NEGATIVE
Influenza B by PCR: NEGATIVE
SARS Coronavirus 2 by RT PCR: NEGATIVE

## 2019-12-28 LAB — BRAIN NATRIURETIC PEPTIDE: B Natriuretic Peptide: 117 pg/mL — ABNORMAL HIGH (ref 0.0–100.0)

## 2019-12-28 LAB — FERRITIN: Ferritin: 31 ng/mL (ref 11–307)

## 2019-12-28 LAB — FIBRINOGEN: Fibrinogen: 342 mg/dL (ref 210–475)

## 2019-12-28 LAB — LACTATE DEHYDROGENASE: LDH: 115 U/L (ref 98–192)

## 2019-12-28 MED ORDER — IPRATROPIUM BROMIDE 0.02 % IN SOLN
0.5000 mg | Freq: Once | RESPIRATORY_TRACT | Status: AC
  Administered 2019-12-28: 05:00:00 0.5 mg via RESPIRATORY_TRACT
  Filled 2019-12-28: qty 2.5

## 2019-12-28 MED ORDER — ENOXAPARIN SODIUM 60 MG/0.6ML ~~LOC~~ SOLN: 0.5000 mg/kg | SUBCUTANEOUS | Status: DC

## 2019-12-28 MED ORDER — PREDNISONE 20 MG PO TABS: 40.0000 mg | ORAL_TABLET | Freq: Every day | ORAL | Status: DC

## 2019-12-28 MED ORDER — MAGNESIUM SULFATE 2 GM/50ML IV SOLN
2.0000 g | Freq: Once | INTRAVENOUS | Status: AC
  Administered 2019-12-28: 2 g via INTRAVENOUS
  Filled 2019-12-28: qty 50

## 2019-12-28 MED ORDER — IPRATROPIUM-ALBUTEROL 20-100 MCG/ACT IN AERS
2.0000 | INHALATION_SPRAY | Freq: Once | RESPIRATORY_TRACT | Status: AC
  Administered 2019-12-28: 2 via RESPIRATORY_TRACT

## 2019-12-28 MED ORDER — IPRATROPIUM-ALBUTEROL 0.5-2.5 (3) MG/3ML IN SOLN
3.0000 mL | Freq: Four times a day (QID) | RESPIRATORY_TRACT | Status: DC
  Administered 2019-12-28 – 2019-12-30 (×10): 3 mL via RESPIRATORY_TRACT
  Filled 2019-12-28 (×10): qty 3

## 2019-12-28 MED ORDER — INSULIN ASPART 100 UNIT/ML ~~LOC~~ SOLN
0.0000 [IU] | Freq: Three times a day (TID) | SUBCUTANEOUS | Status: DC
  Administered 2019-12-28: 17:00:00 15 [IU] via SUBCUTANEOUS
  Administered 2019-12-28: 20 [IU] via SUBCUTANEOUS
  Filled 2019-12-28 (×2): qty 1

## 2019-12-28 MED ORDER — POLYETHYLENE GLYCOL 3350 17 G PO PACK
17.0000 g | PACK | Freq: Two times a day (BID) | ORAL | Status: DC
  Administered 2019-12-28: 11:00:00 17 g via ORAL
  Filled 2019-12-28: qty 1

## 2019-12-28 MED ORDER — ONDANSETRON HCL 4 MG PO TABS: 4.0000 mg | ORAL_TABLET | Freq: Four times a day (QID) | ORAL | Status: DC | PRN

## 2019-12-28 MED ORDER — IOHEXOL 350 MG/ML SOLN
100.0000 mL | Freq: Once | INTRAVENOUS | Status: AC | PRN
  Administered 2019-12-28: 04:00:00 100 mL via INTRAVENOUS

## 2019-12-28 MED ORDER — INSULIN ASPART 100 UNIT/ML ~~LOC~~ SOLN
24.0000 [IU] | Freq: Once | SUBCUTANEOUS | Status: AC
  Administered 2019-12-28: 24 [IU] via SUBCUTANEOUS

## 2019-12-28 MED ORDER — CHLORHEXIDINE GLUCONATE CLOTH 2 % EX PADS
6.0000 | MEDICATED_PAD | Freq: Every day | CUTANEOUS | Status: DC
  Administered 2019-12-28 – 2019-12-29 (×2): 6 via TOPICAL

## 2019-12-28 MED ORDER — METHYLPREDNISOLONE SODIUM SUCC 125 MG IJ SOLR
60.0000 mg | Freq: Four times a day (QID) | INTRAMUSCULAR | Status: DC
  Administered 2019-12-28 (×2): 60 mg via INTRAVENOUS
  Filled 2019-12-28 (×2): qty 2

## 2019-12-28 MED ORDER — GUAIFENESIN ER 600 MG PO TB12
600.0000 mg | ORAL_TABLET | Freq: Two times a day (BID) | ORAL | Status: DC
  Administered 2019-12-28 – 2019-12-30 (×4): 600 mg via ORAL
  Filled 2019-12-28 (×6): qty 1

## 2019-12-28 MED ORDER — ALBUTEROL SULFATE HFA 108 (90 BASE) MCG/ACT IN AERS
6.0000 | INHALATION_SPRAY | Freq: Once | RESPIRATORY_TRACT | Status: AC
  Administered 2019-12-28: 6 via RESPIRATORY_TRACT
  Filled 2019-12-28: qty 6.7

## 2019-12-28 MED ORDER — METHYLPREDNISOLONE SODIUM SUCC 40 MG IJ SOLR
40.0000 mg | Freq: Three times a day (TID) | INTRAMUSCULAR | Status: DC
  Administered 2019-12-28 – 2019-12-29 (×2): 40 mg via INTRAVENOUS
  Filled 2019-12-28 (×2): qty 1

## 2019-12-28 MED ORDER — ONDANSETRON HCL 4 MG/2ML IJ SOLN: 4.0000 mg | Freq: Four times a day (QID) | INTRAMUSCULAR | Status: DC | PRN

## 2019-12-28 MED ORDER — IPRATROPIUM-ALBUTEROL 20-100 MCG/ACT IN AERS
2.0000 | INHALATION_SPRAY | Freq: Once | RESPIRATORY_TRACT | Status: AC
  Administered 2019-12-28: 2 via RESPIRATORY_TRACT
  Filled 2019-12-28: qty 4

## 2019-12-28 MED ORDER — IPRATROPIUM BROMIDE HFA 17 MCG/ACT IN AERS
2.0000 | INHALATION_SPRAY | Freq: Once | RESPIRATORY_TRACT | Status: DC
  Filled 2019-12-28: qty 12.9

## 2019-12-28 MED ORDER — INSULIN GLARGINE 100 UNIT/ML ~~LOC~~ SOLN
20.0000 [IU] | Freq: Every day | SUBCUTANEOUS | Status: DC
  Administered 2019-12-28: 20 [IU] via SUBCUTANEOUS
  Filled 2019-12-28 (×3): qty 0.2

## 2019-12-28 MED ORDER — ALBUTEROL SULFATE HFA 108 (90 BASE) MCG/ACT IN AERS
6.0000 | INHALATION_SPRAY | Freq: Once | RESPIRATORY_TRACT | Status: AC
  Administered 2019-12-28: 6 via RESPIRATORY_TRACT

## 2019-12-28 MED ORDER — ALBUTEROL (5 MG/ML) CONTINUOUS INHALATION SOLN
10.0000 mg/h | INHALATION_SOLUTION | Freq: Once | RESPIRATORY_TRACT | Status: AC
  Administered 2019-12-28: 05:00:00 10 mg/h via RESPIRATORY_TRACT
  Filled 2019-12-28: qty 20

## 2019-12-28 MED ORDER — ALBUTEROL SULFATE HFA 108 (90 BASE) MCG/ACT IN AERS
8.0000 | INHALATION_SPRAY | Freq: Once | RESPIRATORY_TRACT | Status: DC
  Filled 2019-12-28: qty 6.7

## 2019-12-28 MED ORDER — ALBUTEROL SULFATE (2.5 MG/3ML) 0.083% IN NEBU
2.5000 mg | INHALATION_SOLUTION | RESPIRATORY_TRACT | Status: DC | PRN
  Administered 2019-12-28: 13:00:00 2.5 mg via RESPIRATORY_TRACT

## 2019-12-28 MED ORDER — SODIUM CHLORIDE 0.9 % IV BOLUS
500.0000 mL | Freq: Once | INTRAVENOUS | Status: AC
  Administered 2019-12-28: 500 mL via INTRAVENOUS

## 2019-12-28 NOTE — Progress Notes (Signed)
  Patient seen and evaluated, chart reviewed, please see EMR for updated orders. Please see full H&P dictated by admitting physician Dr. Robb Matar for same date of service.   Brief Summary:- 44 y.o. female with medical history significant of asthma, axillary adenopathy, moderate pulmonary hypertension, chronic right-sided CHF with cor pulmonale, COPD, type 2 diabetes, DVT, hepatic cirrhosis, history of alcoholism, irregular menstrual period, migraine headaches, OSA dome BiPAP at bedtime, periodontal disease, history of pneumonia, history of PE in 2013 admitted on 12/28/2019 with acute on chronic hypoxic and hypercapnic respiratory failure   A/p 1)Acute on chronic hypoxic and hypercapnic respiratory failure in the setting of pulmonary HTN and chronic right-sided HF with cor pulmonale and COPD/OSA-----despite continuous BiPAP use repeat ABG shows uncompensated respiratory acidosis with a pH of 7.34, PCO2 of 75.8 and PO2 of 61 -Patient will need BiPAP 4 hours on couple of hours of for meals, will need BiPAP nightly as well -Keep in stepdown unit -PTA patient was on 3 L of oxygen  at home with BiPAP nightly use -Chest x-ray and CT chest without acute findings specifically no acute infectious process like pneumonia or PE -Continue IV Solu-Medrol, bronchodilators -Smoking cessation strongly advised -Echo with EF of 60 to 65%,  2)DM2--- A1c 6.9, reflecting excellent diabetic control PTA -Anticipate worsening glycemic control with high-dose steroids -Diabetic educator input appreciated -use Lantus insulin 20 units nightly along with sliding scale coverage   Patient seen and evaluated, chart reviewed, please see EMR for updated orders. Please see full H&P dictated by admitting physician Dr. Robb Matar for same date of service.  Shon Hale, MD

## 2019-12-28 NOTE — ED Notes (Signed)
Hospitalist MD made aware of CBG 426 and new order to give 24 units [place] Novolog instead of sliding scale order this am.

## 2019-12-28 NOTE — Progress Notes (Signed)
*  PRELIMINARY RESULTS* Echocardiogram 2D Echocardiogram has been performed.  Mary [name] 12/28/2019, 11:01 AM

## 2019-12-28 NOTE — Progress Notes (Signed)
Transported pt from ED 18 to ICU 5 on 4lpm [place] without complication. She was then placed back on bipap at this time due to increased CO2 on latest ABG. She is resting on Bipap at this time and comfortable

## 2019-12-28 NOTE — ED Notes (Signed)
Respiratory Therapy at bedside discussing treatment plan with ED Provider and Pt.

## 2019-12-28 NOTE — ED Notes (Signed)
Echo in progress at bedside at this time. 

## 2019-12-28 NOTE — H&P (Signed)
History and Physical    Mary [name] WUJ:[id number] DOB: 1976-04-02 DOA: 12/28/2019  PCP: Corwin Levins, MD   Patient coming from: Home.  I have personally briefly reviewed patient's old medical records in [facility]  Chief Complaint: Shortness of breath.  HPI: Mary [name] is a 44 y.o. female with medical history significant of asthma, axillary adenopathy, moderate pulmonary hypertension, chronic right-sided CHF with cor pulmonale, COPD, type 2 diabetes, DVT, hepatic cirrhosis, history of alcoholism, irregular menstrual period, migraine headaches, OSA dome BiPAP at bedtime, periodontal disease, history of pneumonia, history of PE in 2013 who is coming to the emergency department due to worsening baseline dyspnea for 3 to 4 days.  She denies fever, chills, sore throat, rhinorrhea.  She denies travel history or sick contacts.  No chest pain, dizziness, diaphoresis, pitting edema lower extremities, but complains of orthopnea.  She has frequent constipation, but denies abdominal pain, diarrhea, nausea or vomiting, melena or hematochezia.  No dysuria, frequency or hematuria.  She denies polyuria, polydipsia, polyphagia or blurred vision.  ED Course: Initial vital signs temperature 98.7 F, pulse 112, respirations 23, blood pressure 139/79 mmHg and O2 sat 98% on room air.  The patient was given 125 mg of Solu-Medrol by EMS.  In the ED, she continued to received supplemental oxygen, had albuterol MDI twice and this was followed by a nebulizer treatment.  She was placed on BiPAP ventilation.  Venous blood gas done which shows normal pH but significantly increased pCO2 84.8 and pO2 of 129.0 mmHg while on 36% oxygen.  Her bicarbonate was 34.5 and acid base excess was 13.6 mmol/L.  Her O2 sat was 99.3%.Urine pregnancy test was negative.  CBC showed a white count 7.7, hemoglobin 13.2 g/dL and platelets 956.  Her MCV was elevated 105.4.  Fibrinogen was 342 and D-dimer was 7.27.  Lactic acid was  2.6 and repeat lactic acid 1.2 mmol/L.  BNP was 117.0 pg/mL.  Sodium is 134, potassium 3.5, chloride 86 and CO2 37 mmol/L.  Glucose 260, BUN 6, creatinine 0.51 and calcium 8.6 mg/dL.  Total protein 8.2, albumin 3.5 g/dL.  AST 55, ALT 24 and alkaline phosphatase 131 units/L.  Total bilirubin is 1.5 mg/dL.  The patient liver functions are improved when compared to previous.  SARS 2, influenza a and influenza B by PCR was negative.  Procalcitonin was less than 0.10 ng/mL.  CRP was 2.3 and ferritin was 31.  Imaging:  Chest radiograph did not show any acute findings.  CT did not show any acute intrathoracic abnormality or acute PE.  There is chronic dilatation of the main pulmonary artery, heterogenous pulmonary parenchyma with areas showing architectural distortion and scaring which is unchanged from previous scan.  There is hepatosplenomegaly and hepatic asteatosis.  There is a stable groundglass nodule and right upper lobe.  CT follow-up suggested given the patient risk.  Please see images and full radiology report for further detail.  Review of Systems: As per HPI otherwise 10 point review of systems negative.   Past Medical History:  Diagnosis Date  . Asthma   . Axillary adenopathy    right axillary adenopathy noted on CT chest (03/06/2012)  . Chronic right-sided CHF (congestive heart failure) (HCC) 03/23/2012   with cor pulmonale. Last RHC 03/2012  . COPD (chronic obstructive pulmonary disease) (HCC)    PFTS 03/08/12: fev1 0.58L/1%, FVC 1.18/33%, Ratop 49 and c/w ssevere obstruction. 21% BD response on FVC, RV 219%, DLCO 11/54%  . Diabetes mellitus without  complication (HCC)   . DVT of upper extremity (deep vein thrombosis) [facility]) April 2013   right subclavian // Unclear precipitating cause - possibly significant right heart failure, with vascular stasis potentially predisposing to clotting.    . Exertional shortness of breath   . Hepatic cirrhosis (HCC)    Questionable history of - Noted on CT  abdomen (03/2012) - thought to be due to vascular congestion from right heart failure +/- patient's history of alcohol abuse  . History of alcoholism (HCC)   . History of chronic bronchitis   . Irregular menses   . Migraine    "~ 1/yr" (05/03/2013)  . Moderate to severe pulmonary hypertension [facility]) April 2013   Cardiac cath on 03/06/12 - 1. Elevated pulmonary artery pressures, right sided filling pressures.,  2. PA: 64/45 (mean 53)    . On home oxygen therapy    "2-3 L 24/7" (05/03/2013)  . OSA (obstructive sleep apnea)    wears noctural BiPAP (05/03/2013)  . Periodontal disease   . Pneumonia ~ 1985; 01/2011  . Pulmonary embolus [facility]) April 2013   Precipitating cause unclear. Was treated with coumadin from April-June 2013.  . Pulmonary hypertension (HCC)     Past Surgical History:  Procedure Laterality Date  . APPENDECTOMY  05/03/2013  . CARDIAC CATHETERIZATION  03/2012  . CARDIAC CATHETERIZATION N/A 01/08/2016   Procedure: Right Heart Cath;  Surgeon: Dolores Patty, MD;  Location: [facility] INVASIVE CV LAB;  Service: Cardiovascular;  Laterality: N/A;  . CARDIAC SURGERY  09/13/77   "my heart was backwards" (05/03/2013)  . LAPAROSCOPIC APPENDECTOMY N/A 05/03/2013   Procedure: APPENDECTOMY LAPAROSCOPIC;  Surgeon: Cherylynn Ridges, MD;  Location: [facility] OR;  Service: General;  Laterality: N/A;  . LUNG SURGERY  November 19, 1977  . RIGHT HEART CATHETERIZATION N/A 03/07/2012   Procedure: RIGHT HEART CATH;  Surgeon: Kathleene Hazel, MD;  Location: [facility] CATH LAB;  Service: Cardiovascular;  Laterality: N/A;     reports that she quit smoking about 10 months ago. Her smoking use included cigarettes. She has a 6.25 pack-year smoking history. She has never used smokeless tobacco. She reports previous alcohol use. She reports that she does not use drugs.  Allergies  Allergen Reactions  . Bee Venom Swelling    Pt states she swells up 3x normal size. If stung "2-3 times, my throat could close up"    Family  History  Problem Relation Age of Onset  . Hypertension Father   . Heart disease Father        CHF; died age 57   . Alcohol abuse Father   . Other Brother        died age 84 y.o overdose   Prior to Admission medications   Medication Sig Start Date End Date Taking? Authorizing Provider  albuterol (VENTOLIN HFA) 108 (90 Base) MCG/ACT inhaler USE 2 PUFFS EVERY 6 HOURS AS NEEDED FOR WHEEZING 11/22/19   Corwin Levins, MD  budesonide-formoterol [facility]) 160-4.5 MCG/ACT inhaler INHALE 2 PUFFS BY MOUTH INTO THE LUNGS DAILY 07/17/19   Corwin Levins, MD  gabapentin (NEURONTIN) 300 MG capsule Take 2 capsules (600 mg total) by mouth 3 (three) times daily. 11/22/19   Corwin Levins, MD  glucose blood (ONE TOUCH ULTRA TEST) test strip Use as instructed four times per day E11.9 12/08/18   Long, Arlyss Repress, MD  HUMALOG KWIKPEN 100 UNIT/ML KwikPen Inject 0.15 mLs (15 Units total) into the skin 3 (three) times daily with meals. Restart after finished  with 70/30 insulin 12/08/18   Albrizze, [employer] E, PA-C  Insulin Glargine (BASAGLAR KWIKPEN) 100 UNIT/ML SOPN Inject 0.25 mLs (25 Units total) into the skin daily at 10 pm. E11.9  Restart after finished with 70/30 insulin Patient taking differently: Inject 20 Units into the skin daily at 10 pm. E11.9  Restart after finished with 70/30 insulin 09/15/18   Tat, Onalee Hua, MD  Insulin Pen Needle (BD PEN NEEDLE NANO U/F) 32G X 4 MM MISC Use as directed four times per day daily E11.9 05/24/18   Corwin Levins, MD  ipratropium-albuterol (DUONEB) 0.5-2.5 (3) MG/3ML SOLN TAKE 3 MLS BY NEBULIZATION EVERY 4 (FOUR) HOURS AS NEEDED. 07/30/19   Kalman Shan, MD  nitrofurantoin, macrocrystal-monohydrate, (MACROBID) 100 MG capsule Take 1 capsule (100 mg total) by mouth 2 (two) times daily. 05/07/19   Olive Bass, FNP  pantoprazole (PROTONIX) 40 MG tablet Take 1 tablet (40 mg total) by mouth 2 (two) times daily before a meal. 07/22/16   Jeralyn Bennett, MD  potassium chloride  (KLOR-CON 10) 10 MEQ tablet Take 1 tablet (10 mEq total) by mouth every other day. 12/13/19   Parrett, Virgel Bouquet, NP  predniSONE (DELTASONE) 20 MG tablet Take 1 tablet (20 mg total) by mouth daily with breakfast. 12/13/19   Parrett, Virgel Bouquet, NP  pregabalin (LYRICA) 50 MG capsule Take 1 capsule (50 mg total) by mouth 2 (two) times daily. 10/31/17   Plotnikov, Georgina Quint, MD  Tiotropium Bromide Monohydrate (SPIRIVA RESPIMAT) 1.25 MCG/ACT AERS Inhale 2 puffs into the lungs daily. 07/17/19   Corwin Levins, MD  torsemide (DEMADEX) 20 MG tablet TAKE 1 TABLET BY MOUTH EVERY DAY 12/12/18   Corwin Levins, MD  traMADol (ULTRAM) 50 MG tablet TAKE 1 TABLET BY MOUTH EVERY 6 HOURS AS NEEDED 10/30/19   Corwin Levins, MD    Physical Exam: Vitals:   12/28/19 0424 12/28/19 0430 12/28/19 0446 12/28/19 0448  BP:  137/77  137/77  Pulse: (!) 111 (!) 111  (!) 110  Resp: 15 (!) 24  (!) 26  Temp:      TempSrc:      SpO2: 97% 93% 94% 93%  Weight:      Height:        Constitutional: NAD, calm, comfortable Eyes: PERRL, lids and conjunctivae normal. ENMT: BiPAP mask.  Mucous membranes are moist. Posterior pharynx clear of any exudate or lesions. Neck: normal, supple, no masses, no thyromegaly Respiratory: Tachypneic in the mid 20s.  Decreased breath sounds with bilateral wheezing. No accessory muscle use.  Cardiovascular: Regular rate and rhythm, no murmurs / rubs / gallops. No extremity edema. 2+ pedal pulses. No carotid bruits.  Abdomen: Distended, obese, positive caput medusa.  Bowel sounds positive.  Soft, no tenderness, no masses palpated. No hepatosplenomegaly. Musculoskeletal: no clubbing / cyanosis. Good ROM, no contractures. Normal muscle tone.  Skin: Left upper back surgical scar.  Capital medusa on abdomen. Neurologic: CN 2-12 grossly intact. Sensation intact, DTR normal. Strength 5/5 in all 4.  Psychiatric: Normal judgment and insight. Alert and oriented x 3.  Mildly anxious mood.   Labs on Admission: I have  personally reviewed following labs and imaging studies  CBC: Recent Labs  Lab 12/28/19 0218  WBC 7.7  NEUTROABS 4.8  HGB 13.2  HCT 40.8  MCV 105.4*  PLT 111*   Basic Metabolic Panel: Recent Labs  Lab 12/28/19 0218  NA 134*  K 3.5  CL 86*  CO2 37*  GLUCOSE 260*  BUN  6  CREATININE 0.51  CALCIUM 8.6*   GFR: Estimated Creatinine Clearance: 102.2 mL/min (by C-G formula based on SCr of 0.51 mg/dL). Liver Function Tests: Recent Labs  Lab 12/28/19 0218  AST 55*  ALT 24  ALKPHOS 131*  BILITOT 1.5*  PROT 8.2*  ALBUMIN 3.5   No results for input(s): LIPASE, AMYLASE in the last 168 hours. No results for input(s): AMMONIA in the last 168 hours. Coagulation Profile: No results for input(s): INR, PROTIME in the last 168 hours. Cardiac Enzymes: No results for input(s): CKTOTAL, CKMB, CKMBINDEX, TROPONINI in the last 168 hours. BNP (last 3 results) No results for input(s): PROBNP in the last 8760 hours. HbA1C: No results for input(s): HGBA1C in the last 72 hours. CBG: No results for input(s): GLUCAP in the last 168 hours. Lipid Profile: Recent Labs    12/28/19 0218  TRIG 210*   Thyroid Function Tests: No results for input(s): TSH, T4TOTAL, FREET4, T3FREE, THYROIDAB in the last 72 hours. Anemia Panel: Recent Labs    12/28/19 0218  FERRITIN 31   Urine analysis:    Component Value Date/Time   COLORURINE AMBER (A) 12/08/2018 2051   APPEARANCEUR HAZY (A) 12/08/2018 2051   LABSPEC 1.028 12/08/2018 2051   PHURINE 7.0 12/08/2018 2051   GLUCOSEU >=500 (A) 12/08/2018 2051   GLUCOSEU 100 (A) 02/24/2018 1644   HGBUR NEGATIVE 12/08/2018 2051   BILIRUBINUR SMALL (A) 12/08/2018 2051   KETONESUR NEGATIVE 12/08/2018 2051   PROTEINUR NEGATIVE 12/08/2018 2051   UROBILINOGEN 2.0 (A) 02/24/2018 1644   NITRITE NEGATIVE 12/08/2018 2051   LEUKOCYTESUR NEGATIVE 12/08/2018 2051    Radiological Exams on Admission: CT Angio Chest PE W/Cm &/Or Wo Cm  Result Date:  12/28/2019 CLINICAL DATA:  Shortness of breath. Elevated D-dimer. Cough. History of pulmonary embolus. EXAM: CT ANGIOGRAPHY CHEST WITH CONTRAST TECHNIQUE: Multidetector CT imaging of the chest was performed using the standard protocol during bolus administration of intravenous contrast. Multiplanar CT image reconstructions and MIPs were obtained to evaluate the vascular anatomy. CONTRAST:  100mL OMNIPAQUE IOHEXOL 350 MG/ML SOLN COMPARISON:  Radiograph earlier this day. Most recent chest CT 02/08/2018 FINDINGS: Cardiovascular: Right upper extremity injection. Dense contrast refluxes into right chest wall collaterals, also seen on prior exam. No evidence of SVC occlusion. Patient has history of right subclavian DVT. Chronic dilatation of the main pulmonary artery at 4.1 cm. There are no filling defects within the pulmonary arteries to suggest acute pulmonary embolus. Evaluation is diagnostic to the proximal segmental level. Mild cardiomegaly. Slight leftward heart deviation unchanged. No pericardial effusion. No aortic dissection. Mediastinum/Nodes: Multiple small mediastinal lymph nodes, not enlarged by size criteria. Multiple small mediastinal collaterals. No esophageal wall thickening. Thyroid gland not visualized. Lungs/Pleura: Heterogeneous pulmonary parenchyma with areas of architectural distortion. Scattered areas of pulmonary scarring. No acute airspace disease or pleural effusion. No pulmonary edema. Mild distal bronchial wall thickening. No pulmonary mass. Stable area of ground-glass in the posterior segment of the right upper lobe dating back to 2018 and considered benign. Upper Abdomen: Partially included hepatosplenomegaly and hepatic steatosis. Musculoskeletal: There are no acute or suspicious osseous abnormalities. Review of the MIP images confirms the above findings. IMPRESSION: 1. No acute pulmonary embolus.  No acute intrathoracic abnormality. 2. Chronic dilatation of the main pulmonary artery,  suggesting pulmonary arterial hypertension. 3. Heterogeneous pulmonary parenchyma with areas of architectural distortion in pulmonary scarring, not significantly changed from 2018 high-resolution chest CT. 4. Partially included hepatosplenomegaly and hepatic steatosis. 5. Stable ground-glass nodule in the right  upper lobe dating back to 2018 consistent with benign etiology. No further follow-up is needed if patient is low risk. Non-contrast chest CT can be considered in 12 months. This recommendation follows the consensus statement: Guidelines for Management of Incidental Pulmonary Nodules Detected on CT Images: From the Fleischner Society 2017; Radiology 2017; 284:228-243. 6. Stable right anterior chest wall collaterals with reflux of IV contrast. Stable small mediastinal collaterals. This is unchanged from 02/08/2018 chest CTA. Electronically Signed   By: Keith Rake M.D.   On: 12/28/2019 04:30   DG Chest Port 1 View  Result Date: 12/28/2019 CLINICAL DATA:  Shortness of breath EXAM: PORTABLE CHEST 1 VIEW COMPARISON:  12/28/2018 FINDINGS: The heart size and mediastinal contours are within normal limits. Both lungs are clear. The visualized skeletal structures are unremarkable. IMPRESSION: No active disease. Electronically Signed   By: Ulyses Jarred M.D.   On: 12/28/2019 03:01    EKG: Independently reviewed.  Vent. rate 111 BPM PR interval * ms QRS duration 89 ms QT/QTc 338/460 ms P-R-T axes 88 102 79 Sinus tachycardia Biatrial enlargement Right axis deviation Nonspecific T abnrm, anterolateral leads Baseline wander  Assessment/Plan Principal Problem:   Acute on chronic respiratory failure with hypoxia and hypercapnia (HCC) Observation/stepdown. Continue supplemental oxygen. Continue with scheduled and as needed bronchodilators. Continue BiPAP ventilation. Continue Solu-Medrol 60 mg IVP every 6 hours.  Active Problems:   Tobacco use Tobacco cessation advised.    OSA on  BiPAP Continue BiPAP at bedtime.    Pulmonary hypertension (HCC) Check echocardiogram.    Diabetes mellitus type 2 in obese (HCC) Carbohydrate modified diet. At home on 15 units of NovoLog 3 times daily with meals. CBG monitoring every 4 hours while on glucocorticoids. Check hemoglobin A1c.    Diabetic neuropathy (HCC) Continue gabapentin.    Thrombocytopenia ([place]) Secondary to hepatosplenomegaly. Monitor platelet count.    COPD with acute exacerbation (HCC)   Abnormal LFTs (liver function tests) Combination of EtOH, NASH and cardiac cirrhosis   DVT prophylaxis: SCD. Code Status: Full code Family Communication: Disposition Plan: Observation for COPD exacerbation and treatment. Consults called: Admission status: Observation/stepdown.   Reubin Milan MD [facility]  If 7PM-7AM, please contact night-coverage [email or web address]  12/28/2019, 5:29 AM   This document was prepared using Dragon voice recognition software and may contain some unintended transcription errors.

## 2019-12-28 NOTE — ED Notes (Signed)
Date and time results received: 12/28/19 0322   Test: Lactic Critical Value: 2.6  Name of Provider Notified: Lynelle Doctor, MD

## 2019-12-28 NOTE — ED Notes (Signed)
Date and time results received: 12/28/19 0254   Test: CO2 Critical Value: 84.8  Name of Provider Notified: Lynelle Doctor, MD

## 2019-12-28 NOTE — Progress Notes (Signed)
Inpatient Diabetes Program Recommendations  AACE/ADA: New Consensus Statement on Inpatient Glycemic Control (2015)  Target Ranges:  Prepandial:   less than 140 mg/dL      Peak postprandial:   less than 180 mg/dL (1-2 hours)      Critically ill patients:  140 - 180 mg/dL   Lab Results  Component Value Date   GLUCAP 360 (H) 12/28/2019   HGBA1C 6.9 (H) 12/28/2019    Review of Glycemic Control  Diabetes history: Type 2? Outpatient Diabetes medications: Basalglar 20 units every HS, Humalog 15 units TID Current orders for Inpatient glycemic control: Novolog RESISTANT correction scale TID  Inpatient Diabetes Program Recommendations:   Noted that patient's blood sugars were in the 400's on admission. Recommend adding Lantus 20 units at HS and continue Novolog RESISTANT correction scale TID.  Smith Mince RN BSN CDE Diabetes Coordinator Pager: [phone]  8am-5pm

## 2019-12-28 NOTE — Progress Notes (Signed)
Changed patients BiPAP to dream station BIPAP, mainly so she can remove and place on as needed. She is alert and oriented. She is on 4.5 liters oxygen saturation drops from 90 to 86 with no ill effects demonstrated by her. Since her base excess on her Blood gas is 13.8 suspect her PCO2 is always high range. Ph is normal 7.342 Lungs are clear by x-ray. Breath sounds diminished.

## 2019-12-28 NOTE — ED Triage Notes (Signed)
Pt reports increased sob and new cough x several days.  Pt states tonight her sats dropped in the 50's at home.  Pt has a thick productive cough and some chest discomfort.

## 2019-12-28 NOTE — ED Notes (Signed)
Verbal order from Hospitalist at this time to d/c bipap and change to normal 3-4 L per n/c then repeat ABG around 1300 today. Respiratory made aware.

## 2019-12-28 NOTE — ED Provider Notes (Signed)
[facility] EMERGENCY DEPARTMENT Provider Note   CSN: [id number] Arrival date & time: 12/28/19  0144   Time seen 2:00 AM  History Chief Complaint  Patient presents with  . Shortness of Breath    cough    Mary [name] is a 44 y.o. female.  HPI   Patient states she is on home oxygen at 3 L/min nasal cannula, sometimes when she is stressed she smokes.  She estimates a pack would last her 2 months however her husband smokes so she pumps from him.  She states the last couple nights her oxygen level has been dropping when she is sleeping.  She states normally her baseline oxygen is 91% on her 3 L/min nasal cannula.  She states tonight she woke up because she had a stabbing pain in her throat and her pulse ox was 52%.  She states she was feeling short of breath and she did a double breathing treatment and pursed lip breathing and it only improved to 75%.  At this point she called EMS.  She states they initially had a pulse ox of 78% and they put her in the ambulance and then got a oxygen level of 83%.  She states they gave her IV Solu-Medrol and no other treatment.  Currently during my exam on 4 L/min nasal cannula her pulse ox is 100%.  She denies fever, sore throat, or diarrhea although she states her bowel movements have been sticky.  She denies any swelling of her extremities.  She states she had nausea and vomiting right before EMS got to her house tonight but she thinks that was from anxiety.  She had some chest pain tonight right before EMS came and states it was in her posterior chest and the upper areas.  She states she has an increased cough compared to her baseline and she is coughing up white sputum.  She states she has not been exposed to anybody that she knows of with Covid.  She states with her last pulmonary visit on the seventh she was having some mild exacerbation of her symptoms.  She was put on prednisone 20 mg a day for 3 days.  At that time she was complaining of retaining  fluid and increasing weight and they did have her take an extra torsemide.  She does do BiPAP at night and during the day if she takes a nap.  PCP Biagio Borg, MD Pulmonary Dr Chase Caller  Past Medical History:  Diagnosis Date  . Asthma   . Axillary adenopathy    right axillary adenopathy noted on CT chest (03/06/2012)  . Chronic right-sided CHF (congestive heart failure) ([place]) 03/23/2012   with cor pulmonale. Last RHC 03/2012  . COPD (chronic obstructive pulmonary disease) ([place])    PFTS 03/08/12: fev1 0.58L/1%, FVC 1.18/33%, Ratop 49 and c/w ssevere obstruction. 21% BD response on FVC, RV 219%, DLCO 11/54%  . Diabetes mellitus without complication ([place])   . DVT of upper extremity (deep vein thrombosis) [facility]) April 2013   right subclavian // Unclear precipitating cause - possibly significant right heart failure, with vascular stasis potentially predisposing to clotting.    . Exertional shortness of breath   . Hepatic cirrhosis ([place])    Questionable history of - Noted on CT abdomen (03/2012) - thought to be due to vascular congestion from right heart failure +/- patient's history of alcohol abuse  . History of alcoholism ([place])   . History of chronic bronchitis   . Irregular menses   .  Migraine    "~ 1/yr" (05/03/2013)  . Moderate to severe pulmonary hypertension [facility]) April 2013   Cardiac cath on 03/06/12 - 1. Elevated pulmonary artery pressures, right sided filling pressures.,  2. PA: 64/45 (mean 53)    . On home oxygen therapy    "2-3 L 24/7" (05/03/2013)  . OSA (obstructive sleep apnea)    wears noctural BiPAP (05/03/2013)  . Periodontal disease   . Pneumonia ~ 1985; 01/2011  . Pulmonary embolus [facility]) April 2013   Precipitating cause unclear. Was treated with coumadin from April-June 2013.  . Pulmonary hypertension [facility])     Patient Active Problem List   Diagnosis Date Noted  . Low back pain 03/01/2019  . Acute on chronic respiratory failure with hypoxemia ([place]) 12/28/2018  .  Migraine 12/28/2018  . Neuropathy 12/28/2018  . Chronic pain 08/30/2018  . COPD exacerbation ([place]) 07/03/2018  . COPD with exacerbation ([place]) 05/03/2018  . COPD with acute exacerbation ([place]) 01/11/2018  . Left flank pain 11/01/2017  . Positive D dimer   . Leukocytosis 04/29/2017  . Acute exacerbation of chronic obstructive pulmonary disease (COPD) ([place]) 04/24/2017  . Steroid-induced diabetes ([place]) 04/24/2017  . Left ankle sprain 04/21/2017  . Fungemia 02/13/2017  . COPD (chronic obstructive pulmonary disease) ([place]) 02/13/2017  . Chronic respiratory failure with hypoxia and hypercapnia ([place]) 02/13/2017  . Thrombocytopenia ([place]) 02/01/2017  . Alcohol abuse 01/31/2017  . Acute on chronic respiratory failure with hypoxia and hypercapnia (HCC)   . Tracheomalacia   . Palliative care by specialist   . Anxiety state   . Advance directive declined by patient   . Goals of care, counseling/discussion   . Chronic hoarseness 07/27/2016  . Diabetic neuropathy ([place]) 04/30/2016  . Diabetes mellitus type 2 in obese ([place]) 04/10/2016  . Chronic diastolic heart failure ([place]) 01/25/2016  . PAH (pulmonary artery hypertension) ([place])   . Constipation 11/18/2015  . Morbid obesity ([place]) 11/18/2015  . Lung nodule 01/30/2015  . Pulmonary mass 01/13/2015  . Gastroparesis   . Esophageal reflux   . Obstipation   . Hypokalemia 02/28/2013  . Financial difficulties 02/28/2013  . Obesity hypoventilation syndrome ([place]) 02/28/2013  . Myalgia 02/28/2013  . Influenza A 02/28/2013  . Anovulation 06/30/2012  . Preventative health care 05/05/2012  . Irregular menstrual cycle 04/27/2012  . Chronic right-sided CHF (congestive heart failure) ([place]) 03/23/2012  . Pulmonary hypertension ([place]) 03/23/2012  . Cor pulmonale ([place]) 03/21/2012  . OSA on BiPAP 03/19/2012  . Axillary lymphadenopathy 03/06/2012  . Congenital heart disease 01/28/2012  . Dental caries 01/28/2012    Past Surgical History:  Procedure  Laterality Date  . APPENDECTOMY  05/03/2013  . CARDIAC CATHETERIZATION  03/2012  . CARDIAC CATHETERIZATION N/A 01/08/2016   Procedure: Right Heart Cath;  Surgeon: Jolaine Artist, MD;  Location: [place] CV LAB;  Service: Cardiovascular;  Laterality: N/A;  . CARDIAC SURGERY  Mar 12, 1977   "my heart was backwards" (05/03/2013)  . LAPAROSCOPIC APPENDECTOMY N/A 05/03/2013   Procedure: APPENDECTOMY LAPAROSCOPIC;  Surgeon: Gwenyth Ober, MD;  Location: [place];  Service: General;  Laterality: N/A;  . LUNG SURGERY  1977/07/12  . RIGHT HEART CATHETERIZATION N/A 03/07/2012   Procedure: RIGHT HEART CATH;  Surgeon: Burnell Blanks, MD;  Location: [facility] CATH LAB;  Service: Cardiovascular;  Laterality: N/A;     OB History   No obstetric history on file.     Family History  Problem Relation Age of Onset  . Hypertension Father   .  Heart disease Father        CHF; died age 56   . Alcohol abuse Father   . Other Brother        died age 9 y.o overdose    Social History   Tobacco Use  . Smoking status: Former Smoker    Packs/day: 0.25    Years: 25.00    Pack years: 6.25    Types: Cigarettes    Quit date: 02/13/2019    Years since quitting: 0.8  . Smokeless tobacco: Never Used  . Tobacco comment: a couple drags of one cigarettes  Substance Use Topics  . Alcohol use: Not Currently  . Drug use: No  lives at home Lives with spouse Home oxygen 3 lpm Guys, Bipap at night  Home Medications Prior to Admission medications   Medication Sig Start Date End Date Taking? Authorizing Provider  albuterol (VENTOLIN HFA) 108 (90 Base) MCG/ACT inhaler USE 2 PUFFS EVERY 6 HOURS AS NEEDED FOR WHEEZING 11/22/19   Biagio Borg, MD  budesonide-formoterol [facility]) 160-4.5 MCG/ACT inhaler INHALE 2 PUFFS BY MOUTH INTO THE LUNGS DAILY 07/17/19   Biagio Borg, MD  gabapentin (NEURONTIN) 300 MG capsule Take 2 capsules (600 mg total) by mouth 3 (three) times daily. 11/22/19   Biagio Borg, MD  glucose blood  (ONE TOUCH ULTRA TEST) test strip Use as instructed four times per day E11.9 12/08/18   Long, Wonda Olds, MD  HUMALOG KWIKPEN 100 UNIT/ML KwikPen Inject 0.15 mLs (15 Units total) into the skin 3 (three) times daily with meals. Restart after finished with 70/30 insulin 12/08/18   Albrizze, [employer] E, PA-C  Insulin Glargine (BASAGLAR KWIKPEN) 100 UNIT/ML SOPN Inject 0.25 mLs (25 Units total) into the skin daily at 10 pm. E11.9  Restart after finished with 70/30 insulin Patient taking differently: Inject 20 Units into the skin daily at 10 pm. E11.9  Restart after finished with 70/30 insulin 09/15/18   Tat, Shanon Brow, MD  Insulin Pen Needle (BD PEN NEEDLE NANO U/F) 32G X 4 MM MISC Use as directed four times per day daily E11.9 05/24/18   Biagio Borg, MD  ipratropium-albuterol (DUONEB) 0.5-2.5 (3) MG/3ML SOLN TAKE 3 MLS BY NEBULIZATION EVERY 4 (FOUR) HOURS AS NEEDED. 07/30/19   Brand Males, MD  nitrofurantoin, macrocrystal-monohydrate, (MACROBID) 100 MG capsule Take 1 capsule (100 mg total) by mouth 2 (two) times daily. 05/07/19   Marrian Salvage, FNP  pantoprazole (PROTONIX) 40 MG tablet Take 1 tablet (40 mg total) by mouth 2 (two) times daily before a meal. 07/22/16   Kelvin Cellar, MD  potassium chloride (KLOR-CON 10) 10 MEQ tablet Take 1 tablet (10 mEq total) by mouth every other day. 12/13/19   Parrett, Fonnie Mu, NP  predniSONE (DELTASONE) 20 MG tablet Take 1 tablet (20 mg total) by mouth daily with breakfast. 12/13/19   Parrett, Fonnie Mu, NP  pregabalin (LYRICA) 50 MG capsule Take 1 capsule (50 mg total) by mouth 2 (two) times daily. 10/31/17   Plotnikov, Evie Lacks, MD  Tiotropium Bromide Monohydrate (SPIRIVA RESPIMAT) 1.25 MCG/ACT AERS Inhale 2 puffs into the lungs daily. 07/17/19   Biagio Borg, MD  torsemide (DEMADEX) 20 MG tablet TAKE 1 TABLET BY MOUTH EVERY DAY 12/12/18   Biagio Borg, MD  traMADol (ULTRAM) 50 MG tablet TAKE 1 TABLET BY MOUTH EVERY 6 HOURS AS NEEDED 10/30/19   Biagio Borg, MD     Allergies    Bee venom  Review of Systems  Review of Systems  All other systems reviewed and are negative.   Physical Exam Updated Vital Signs BP 137/77   Pulse (!) 110   Temp 98.7 F (37.1 C) (Oral)   Resp (!) 26   Ht 5' 2.5" (1.588 m)   Wt 99.8 kg   LMP 12/09/2019 (Approximate)   SpO2 93%   BMI 39.60 kg/m   Physical Exam Vitals and nursing note reviewed.  Constitutional:      General: She is not in acute distress.    Appearance: Normal appearance. She is well-developed. She is not ill-appearing or toxic-appearing.  HENT:     Head: Normocephalic and atraumatic.     Right Ear: External ear normal.     Left Ear: External ear normal.     Nose: Nose normal. No mucosal edema or rhinorrhea.     Mouth/Throat:     Dentition: No dental abscesses.     Pharynx: No uvula swelling.  Eyes:     Extraocular Movements: Extraocular movements intact.     Conjunctiva/sclera: Conjunctivae normal.     Pupils: Pupils are equal, round, and reactive to light.  Cardiovascular:     Rate and Rhythm: Normal rate and regular rhythm.     Heart sounds: Normal heart sounds. No murmur. No friction rub. No gallop.   Pulmonary:     Effort: Respiratory distress present.     Breath sounds: Decreased air movement present. Decreased breath sounds and wheezing present. No rhonchi or rales.     Comments: Patient has mild shortness of breath on talking.  During my exam her pulse ox was 100% on 4 L. Chest:     Chest wall: No tenderness or crepitus.  Abdominal:     General: Bowel sounds are normal. There is no distension.     Palpations: Abdomen is soft.     Tenderness: There is no abdominal tenderness. There is no guarding or rebound.  Musculoskeletal:        General: No tenderness. Normal range of motion.     Cervical back: Full passive range of motion without pain, normal range of motion and neck supple.     Right lower leg: No edema.     Left lower leg: No edema.     Comments: Moves all  extremities well.   Skin:    General: Skin is warm and dry.     Coloration: Skin is not pale.     Findings: No erythema or rash.  Neurological:     General: No focal deficit present.     Mental Status: She is alert and oriented to person, place, and time.     Cranial Nerves: No cranial nerve deficit.  Psychiatric:        Mood and Affect: Mood normal. Mood is not anxious.        Speech: Speech normal.        Behavior: Behavior normal.        Thought Content: Thought content normal.     ED Results / Procedures / Treatments   Labs (all labs ordered are listed, but only abnormal results are displayed) Results for orders placed or performed during the hospital encounter of 12/28/19  Respiratory Panel by RT PCR (Flu A&B, Covid) - Nasopharyngeal Swab   Specimen: Nasopharyngeal Swab  Result Value Ref Range   SARS Coronavirus 2 by RT PCR NEGATIVE NEGATIVE   Influenza A by PCR NEGATIVE NEGATIVE   Influenza B by PCR NEGATIVE NEGATIVE  Lactic acid, plasma  Result Value Ref Range   Lactic Acid, Venous 2.6 (HH) 0.5 - 1.9 mmol/L  CBC WITH DIFFERENTIAL  Result Value Ref Range   WBC 7.7 4.0 - 10.5 K/uL   RBC 3.87 3.87 - 5.11 MIL/uL   Hemoglobin 13.2 12.0 - 15.0 g/dL   HCT 40.8 36.0 - 46.0 %   MCV 105.4 (H) 80.0 - 100.0 fL   MCH 34.1 (H) 26.0 - 34.0 pg   MCHC 32.4 30.0 - 36.0 g/dL   RDW 14.6 11.5 - 15.5 %   Platelets 111 (L) 150 - 400 K/uL   nRBC 0.0 0.0 - 0.2 %   Neutrophils Relative % 62 %   Neutro Abs 4.8 1.7 - 7.7 K/uL   Lymphocytes Relative 27 %   Lymphs Abs 2.1 0.7 - 4.0 K/uL   Monocytes Relative 8 %   Monocytes Absolute 0.6 0.1 - 1.0 K/uL   Eosinophils Relative 2 %   Eosinophils Absolute 0.2 0.0 - 0.5 K/uL   Basophils Relative 0 %   Basophils Absolute 0.0 0.0 - 0.1 K/uL   Immature Granulocytes 1 %   Abs Immature Granulocytes 0.05 0.00 - 0.07 K/uL  Comprehensive metabolic panel  Result Value Ref Range   Sodium 134 (L) 135 - 145 mmol/L   Potassium 3.5 3.5 - 5.1 mmol/L    Chloride 86 (L) 98 - 111 mmol/L   CO2 37 (H) 22 - 32 mmol/L   Glucose, Bld 260 (H) 70 - 99 mg/dL   BUN 6 6 - 20 mg/dL   Creatinine, Ser 0.51 0.44 - 1.00 mg/dL   Calcium 8.6 (L) 8.9 - 10.3 mg/dL   Total Protein 8.2 (H) 6.5 - 8.1 g/dL   Albumin 3.5 3.5 - 5.0 g/dL   AST 55 (H) 15 - 41 U/L   ALT 24 0 - 44 U/L   Alkaline Phosphatase 131 (H) 38 - 126 U/L   Total Bilirubin 1.5 (H) 0.3 - 1.2 mg/dL   GFR calc non Af Amer >60 >60 mL/min   GFR calc Af Amer >60 >60 mL/min   Anion gap 11 5 - 15  D-dimer, quantitative  Result Value Ref Range   D-Dimer, Quant 7.27 (H) 0.00 - 0.50 ug/mL-FEU  Procalcitonin  Result Value Ref Range   Procalcitonin <0.10 ng/mL  Lactate dehydrogenase  Result Value Ref Range   LDH 115 98 - 192 U/L  Ferritin  Result Value Ref Range   Ferritin 31 11 - 307 ng/mL  Triglycerides  Result Value Ref Range   Triglycerides 210 (H) <150 mg/dL  Fibrinogen  Result Value Ref Range   Fibrinogen 342 210 - 475 mg/dL  C-reactive protein  Result Value Ref Range   CRP 2.3 (H) <1.0 mg/dL  Brain natriuretic peptide  Result Value Ref Range   B Natriuretic Peptide 117.0 (H) 0.0 - 100.0 pg/mL  Blood gas, venous  Result Value Ref Range   FIO2 36.00    Delivery systems NASAL CANNULA    pH, Ven 7.299 7.250 - 7.430   pCO2, Ven 84.8 (HH) 44.0 - 60.0 mmHg   pO2, Ven 129.0 (H) 32.0 - 45.0 mmHg   Bicarbonate 34.5 (H) 20.0 - 28.0 mmol/L   Acid-Base Excess 13.6 (H) 0.0 - 2.0 mmol/L   O2 Saturation 99.3 %   Patient temperature 37.0    Collection site VENOUS   POC urine preg, ED  Result Value Ref Range   Preg Test, Ur NEGATIVE NEGATIVE   Laboratory interpretation all normal except very elevated D-dimer,  a mild worsening of her chronic respiratory acidosis compared to priors where her PCO2 was in the high 50s, elevated CRP, mildly elevated lactic acid, elevated triglycerides which she has had in the past that she is also has diabetes, her other acute phase markers are normal.  She has  persistent elevation of her SGOT.  She is noted to have a persistent thrombocytopenia and a persistent elevation of the MCV consistent with a vitamin Q03 or folic acid deficiency.    EKG EKG Interpretation  Date/Time:  Friday December 28 2019 01:59:23 EST Ventricular Rate:  111 PR Interval:    QRS Duration: 89 QT Interval:  338 QTC Calculation: 460 R Axis:   102 Text Interpretation: Sinus tachycardia Biatrial enlargement Right axis deviation Nonspecific T abnrm, anterolateral leads Baseline wander No significant change since last tracing 28 Dec 2018 Confirmed by Rolland Porter [id number] on 12/28/2019 2:35:14 AM     Radiology CT Angio Chest PE W/Cm &/Or Wo Cm  Result Date: 12/28/2019 CLINICAL DATA:  Shortness of breath. Elevated D-dimer. Cough. History of pulmonary embolus. EXAM: CT ANGIOGRAPHY CHEST WITH CONTRAST TECHNIQUE: Multidetector CT imaging of the chest was performed using the standard protocol during bolus administration of intravenous contrast. Multiplanar CT image reconstructions and MIPs were obtained to evaluate the vascular anatomy. CONTRAST:  135m OMNIPAQUE IOHEXOL 350 MG/ML SOLN COMPARISON:  Radiograph earlier this day. Most recent chest CT 02/08/2018 FINDINGS: Cardiovascular: Right upper extremity injection. Dense contrast refluxes into right chest wall collaterals, also seen on prior exam. No evidence of SVC occlusion. Patient has history of right subclavian DVT. Chronic dilatation of the main pulmonary artery at 4.1 cm. There are no filling defects within the pulmonary arteries to suggest acute pulmonary embolus. Evaluation is diagnostic to the proximal segmental level. Mild cardiomegaly. Slight leftward heart deviation unchanged. No pericardial effusion. No aortic dissection. Mediastinum/Nodes: Multiple small mediastinal lymph nodes, not enlarged by size criteria. Multiple small mediastinal collaterals. No esophageal wall thickening. Thyroid gland not visualized. Lungs/Pleura:  Heterogeneous pulmonary parenchyma with areas of architectural distortion. Scattered areas of pulmonary scarring. No acute airspace disease or pleural effusion. No pulmonary edema. Mild distal bronchial wall thickening. No pulmonary mass. Stable area of ground-glass in the posterior segment of the right upper lobe dating back to 2018 and considered benign. Upper Abdomen: Partially included hepatosplenomegaly and hepatic steatosis. Musculoskeletal: There are no acute or suspicious osseous abnormalities. Review of the MIP images confirms the above findings. IMPRESSION: 1. No acute pulmonary embolus.  No acute intrathoracic abnormality. 2. Chronic dilatation of the main pulmonary artery, suggesting pulmonary arterial hypertension. 3. Heterogeneous pulmonary parenchyma with areas of architectural distortion in pulmonary scarring, not significantly changed from 2018 high-resolution chest CT. 4. Partially included hepatosplenomegaly and hepatic steatosis. 5. Stable ground-glass nodule in the right upper lobe dating back to 2018 consistent with benign etiology. No further follow-up is needed if patient is low risk. Non-contrast chest CT can be considered in 12 months. This recommendation follows the consensus statement: Guidelines for Management of Incidental Pulmonary Nodules Detected on CT Images: From the Fleischner Society 2017; Radiology 2017; 284:228-243. 6. Stable right anterior chest wall collaterals with reflux of IV contrast. Stable small mediastinal collaterals. This is unchanged from 02/08/2018 chest CTA. Electronically Signed   By: MKeith RakeM.D.   On: 12/28/2019 04:30   DG Chest Port 1 View  Result Date: 12/28/2019 CLINICAL DATA:  Shortness of breath EXAM: PORTABLE CHEST 1 VIEW COMPARISON:  12/28/2018 FINDINGS: The heart size and mediastinal contours are  within normal limits. Both lungs are clear. The visualized skeletal structures are unremarkable. IMPRESSION: No active disease. Electronically  Signed   By: Ulyses Jarred M.D.   On: 12/28/2019 03:01    Procedures .Critical Care Performed by: Rolland Porter, MD Authorized by: Rolland Porter, MD   Critical care provider statement:    Critical care time (minutes):  43   Critical care was necessary to treat or prevent imminent or life-threatening deterioration of the following conditions:  Respiratory failure   Critical care was time spent personally by me on the following activities:  Discussions with consultants, examination of patient, obtaining history from patient or surrogate, ordering and review of laboratory studies, ordering and review of radiographic studies, pulse oximetry, re-evaluation of patient's condition and review of old charts   (including critical care time)  Medications Ordered in ED Medications  magnesium sulfate IVPB 2 g 50 mL (0 g Intravenous Stopped 12/28/19 0417)  Ipratropium-Albuterol (COMBIVENT) respimat 2 puff (2 puffs Inhalation Given 12/28/19 0315)  albuterol (VENTOLIN HFA) 108 (90 Base) MCG/ACT inhaler 6 puff (6 puffs Inhalation Given 12/28/19 0315)  sodium chloride 0.9 % bolus 500 mL (0 mLs Intravenous Stopped 12/28/19 0417)  albuterol (VENTOLIN HFA) 108 (90 Base) MCG/ACT inhaler 6 puff (6 puffs Inhalation Given 12/28/19 0336)  Ipratropium-Albuterol (COMBIVENT) respimat 2 puff (2 puffs Inhalation Given 12/28/19 0336)  iohexol (OMNIPAQUE) 350 MG/ML injection 100 mL (100 mLs Intravenous Contrast Given 12/28/19 0356)  albuterol (PROVENTIL,VENTOLIN) solution continuous neb (10 mg/hr Nebulization Given 12/28/19 0446)  ipratropium (ATROVENT) nebulizer solution 0.5 mg (0.5 mg Nebulization Given 12/28/19 0446)    ED Course  I have reviewed the triage vital signs and the nursing notes.  Pertinent labs & imaging results that were available during my care of the patient were reviewed by me and considered in my medical decision making (see chart for details).    MDM Rules/Calculators/A&P                      Patient  states she received Solu-Medrol by EMS.  I explained to her since she has not been in the ED since we have had the Covid outbreak that we do not use nebulizers until we are sure they are Covid negative.  She was given albuterol inhaler and Atrovent inhaler 8 puffs and 2 puffs respectively.  I reviewed her prior labs which were from March 2020 and she had a normal creatinine, she was given magnesium 2 g IV.  Patient's pulse ox was 100% on 4 L/min nasal cannula.  Her oxygen was dropped down to 3 L which is her usual amount and her pulse ox is 97%.  Patient has had hypercarbic respiratory failure before, and we did not want to knock out her respiratory drive.  Our facility does not have Atrovent inhalers, only Combivent or albuterol.  She was given 2 puffs of Combivent +6 puffs of albuterol which is the equivalent of 8 puffs of albuterol +2 puffs of Atrovent.  3:20 AM patient is getting her inhalers now.  Her ABG shows a mild worsening of her chronic respiratory acidosis.  Her point-of-care Covid test was negative.  3:24 AM patient's lactic acid was 2.6, she was given a 500 cc bolus of normal saline.  Review of her c-Met shows she is not in DKA.  3:28 AM patient does not experience any relief with the inhaler she has had.  She appears to be more tachypneic than when I first saw her.  Her  pulse ox now is 84% on her typical 3 L, it was increased to 4.  She is getting a 500 cc bolus of normal saline.  We discussed her D-dimer is very high without having elevation of all the other markers usually associated with Covid.  We are going to do a CTA to rule out PE.  Patient's pulse ox dropped to 86% on 3 L, however she was given a second round of albuterol and Combivent and then it did improve to 91% which is her baseline.  Patient was taken to radiology for her CTA.  5:25 AM patient's Covid test is negative as is her influenza test.  A continuous nebulizer with albuterol and Atrovent was ordered.  She was also  placed on BiPAP.  Patient seems more short of breath than when I first saw her.  Her pulse ox however is 94% on her 3 L.  We discussed admission and she is agreeable.  05:07 AM Dr Olevia Bowens, hospitalist, will admit  Final Clinical Impression(s) / ED Diagnoses Final diagnoses:  COPD exacerbation ([place])  Hypoxia    Rx / DC Orders  Plan admission  Rolland Porter, MD, Barbette Or, MD 12/28/19 [phone]

## 2019-12-29 DIAGNOSIS — E669 Obesity, unspecified: Secondary | ICD-10-CM

## 2019-12-29 DIAGNOSIS — G4733 Obstructive sleep apnea (adult) (pediatric): Secondary | ICD-10-CM

## 2019-12-29 DIAGNOSIS — E1142 Type 2 diabetes mellitus with diabetic polyneuropathy: Secondary | ICD-10-CM

## 2019-12-29 DIAGNOSIS — E1169 Type 2 diabetes mellitus with other specified complication: Secondary | ICD-10-CM

## 2019-12-29 DIAGNOSIS — J441 Chronic obstructive pulmonary disease with (acute) exacerbation: Principal | ICD-10-CM

## 2019-12-29 DIAGNOSIS — D696 Thrombocytopenia, unspecified: Secondary | ICD-10-CM

## 2019-12-29 DIAGNOSIS — I272 Pulmonary hypertension, unspecified: Secondary | ICD-10-CM

## 2019-12-29 LAB — GLUCOSE, CAPILLARY
Glucose-Capillary: 318 mg/dL — ABNORMAL HIGH (ref 70–99)
Glucose-Capillary: 362 mg/dL — ABNORMAL HIGH (ref 70–99)
Glucose-Capillary: 411 mg/dL — ABNORMAL HIGH (ref 70–99)
Glucose-Capillary: 459 mg/dL — ABNORMAL HIGH (ref 70–99)
Glucose-Capillary: 494 mg/dL — ABNORMAL HIGH (ref 70–99)
Glucose-Capillary: 500 mg/dL — ABNORMAL HIGH (ref 70–99)

## 2019-12-29 LAB — COMPREHENSIVE METABOLIC PANEL
ALT: 23 U/L (ref 0–44)
AST: 32 U/L (ref 15–41)
Albumin: 3.6 g/dL (ref 3.5–5.0)
Alkaline Phosphatase: 115 U/L (ref 38–126)
Anion gap: 11 (ref 5–15)
BUN: 14 mg/dL (ref 6–20)
CO2: 37 mmol/L — ABNORMAL HIGH (ref 22–32)
Calcium: 8.8 mg/dL — ABNORMAL LOW (ref 8.9–10.3)
Chloride: 84 mmol/L — ABNORMAL LOW (ref 98–111)
Creatinine, Ser: 0.55 mg/dL (ref 0.44–1.00)
GFR calc Af Amer: >60 mL/min (ref >60)
GFR calc non Af Amer: >60 mL/min (ref >60)
Glucose, Bld: 340 mg/dL — ABNORMAL HIGH (ref 70–99)
Potassium: 3.9 mmol/L (ref 3.5–5.1)
Sodium: 132 mmol/L — ABNORMAL LOW (ref 135–145)
Total Bilirubin: 1.9 mg/dL — ABNORMAL HIGH (ref 0.3–1.2)
Total Protein: 8.1 g/dL (ref 6.5–8.1)

## 2019-12-29 LAB — CBC WITH DIFFERENTIAL/PLATELET
Abs Immature Granulocytes: 0.06 10*3/uL (ref 0.00–0.07)
Basophils Absolute: 0 10*3/uL (ref 0.0–0.1)
Basophils Relative: 0 %
Eosinophils Absolute: 0 10*3/uL (ref 0.0–0.5)
Eosinophils Relative: 0 %
HCT: 40.1 % (ref 36.0–46.0)
Hemoglobin: 12.4 g/dL (ref 12.0–15.0)
Immature Granulocytes: 1 %
Lymphocytes Relative: 7 %
Lymphs Abs: 0.5 10*3/uL — ABNORMAL LOW (ref 0.7–4.0)
MCH: 32.6 pg (ref 26.0–34.0)
MCHC: 30.9 g/dL (ref 30.0–36.0)
MCV: 105.5 fL — ABNORMAL HIGH (ref 80.0–100.0)
Monocytes Absolute: 0.3 10*3/uL (ref 0.1–1.0)
Monocytes Relative: 5 %
Neutro Abs: 6.5 10*3/uL (ref 1.7–7.7)
Neutrophils Relative %: 87 %
Platelets: 86 10*3/uL — ABNORMAL LOW (ref 150–400)
RBC: 3.8 MIL/uL — ABNORMAL LOW (ref 3.87–5.11)
RDW: 14.1 % (ref 11.5–15.5)
WBC: 7.4 10*3/uL (ref 4.0–10.5)
nRBC: 0 % (ref 0.0–0.2)

## 2019-12-29 LAB — GLUCOSE, RANDOM: Glucose, Bld: 515 mg/dL (ref 70–99)

## 2019-12-29 LAB — MRSA PCR SCREENING: MRSA by PCR: POSITIVE — AB

## 2019-12-29 MED ORDER — ACETAMINOPHEN 325 MG PO TABS: 650.0000 mg | ORAL_TABLET | Freq: Four times a day (QID) | ORAL | Status: DC | PRN

## 2019-12-29 MED ORDER — BUDESONIDE 0.25 MG/2ML IN SUSP
0.2500 mg | Freq: Two times a day (BID) | RESPIRATORY_TRACT | Status: DC
  Administered 2019-12-29 – 2019-12-31 (×5): 0.25 mg via RESPIRATORY_TRACT
  Filled 2019-12-29 (×5): qty 2

## 2019-12-29 MED ORDER — AZITHROMYCIN 250 MG PO TABS
500.0000 mg | ORAL_TABLET | Freq: Every day | ORAL | Status: AC
  Administered 2019-12-29: 12:00:00 500 mg via ORAL
  Filled 2019-12-29: qty 2

## 2019-12-29 MED ORDER — POTASSIUM CHLORIDE CRYS ER 10 MEQ PO TBCR
10.0000 meq | EXTENDED_RELEASE_TABLET | ORAL | Status: DC
  Administered 2019-12-29: 10 meq via ORAL
  Filled 2019-12-29: qty 1

## 2019-12-29 MED ORDER — INSULIN ASPART 100 UNIT/ML ~~LOC~~ SOLN
15.0000 [IU] | Freq: Once | SUBCUTANEOUS | Status: AC
  Administered 2019-12-29: 15 [IU] via SUBCUTANEOUS

## 2019-12-29 MED ORDER — GLUCERNA SHAKE PO LIQD
237.0000 mL | Freq: Two times a day (BID) | ORAL | Status: DC
  Administered 2019-12-30 – 2019-12-31 (×2): 237 mL via ORAL

## 2019-12-29 MED ORDER — MUPIROCIN 2 % EX OINT
1.0000 "application " | TOPICAL_OINTMENT | Freq: Two times a day (BID) | CUTANEOUS | Status: DC
  Administered 2019-12-29 – 2019-12-31 (×5): 1 via NASAL
  Filled 2019-12-29 (×4): qty 22

## 2019-12-29 MED ORDER — POTASSIUM CHLORIDE ER 10 MEQ PO TBCR
10.0000 meq | EXTENDED_RELEASE_TABLET | ORAL | Status: DC
  Filled 2019-12-29 (×2): qty 1

## 2019-12-29 MED ORDER — CHLORHEXIDINE GLUCONATE CLOTH 2 % EX PADS
6.0000 | MEDICATED_PAD | Freq: Every day | CUTANEOUS | Status: DC
  Administered 2019-12-30: 6 via TOPICAL

## 2019-12-29 MED ORDER — TORSEMIDE 20 MG PO TABS
20.0000 mg | ORAL_TABLET | Freq: Every day | ORAL | Status: DC
  Administered 2019-12-29 – 2019-12-30 (×2): 20 mg via ORAL
  Filled 2019-12-29 (×3): qty 1

## 2019-12-29 MED ORDER — POLYETHYLENE GLYCOL 3350 17 G PO PACK: 17.0000 g | PACK | Freq: Every day | ORAL | Status: DC | PRN

## 2019-12-29 MED ORDER — PANTOPRAZOLE SODIUM 40 MG PO TBEC
40.0000 mg | DELAYED_RELEASE_TABLET | Freq: Every day | ORAL | Status: DC
  Administered 2019-12-29 – 2019-12-31 (×3): 40 mg via ORAL
  Filled 2019-12-29 (×3): qty 1

## 2019-12-29 MED ORDER — GABAPENTIN 300 MG PO CAPS
600.0000 mg | ORAL_CAPSULE | Freq: Three times a day (TID) | ORAL | Status: DC
  Administered 2019-12-29 – 2019-12-31 (×7): 600 mg via ORAL
  Filled 2019-12-29 (×7): qty 2

## 2019-12-29 MED ORDER — SODIUM CHLORIDE 0.9% FLUSH
3.0000 mL | Freq: Two times a day (BID) | INTRAVENOUS | Status: DC
  Administered 2019-12-29 – 2019-12-31 (×4): 3 mL via INTRAVENOUS

## 2019-12-29 MED ORDER — METHYLPREDNISOLONE SODIUM SUCC 125 MG IJ SOLR
60.0000 mg | Freq: Two times a day (BID) | INTRAMUSCULAR | Status: DC
  Administered 2019-12-29 – 2019-12-30 (×2): 60 mg via INTRAVENOUS
  Filled 2019-12-29 (×2): qty 2

## 2019-12-29 MED ORDER — INSULIN ASPART 100 UNIT/ML ~~LOC~~ SOLN
15.0000 [IU] | Freq: Three times a day (TID) | SUBCUTANEOUS | Status: DC
  Administered 2019-12-29 – 2019-12-30 (×3): 15 [IU] via SUBCUTANEOUS

## 2019-12-29 MED ORDER — AZITHROMYCIN 250 MG PO TABS
250.0000 mg | ORAL_TABLET | Freq: Every day | ORAL | Status: DC
  Administered 2019-12-30 – 2019-12-31 (×2): 250 mg via ORAL
  Filled 2019-12-29 (×2): qty 1

## 2019-12-29 MED ORDER — ADULT MULTIVITAMIN W/MINERALS CH
1.0000 | ORAL_TABLET | Freq: Every day | ORAL | Status: DC
  Administered 2019-12-29 – 2019-12-31 (×3): 1 via ORAL
  Filled 2019-12-29 (×3): qty 1

## 2019-12-29 MED ORDER — TRAMADOL HCL 50 MG PO TABS
50.0000 mg | ORAL_TABLET | Freq: Three times a day (TID) | ORAL | Status: DC | PRN
  Administered 2019-12-29 – 2019-12-31 (×5): 50 mg via ORAL
  Filled 2019-12-29 (×5): qty 1

## 2019-12-29 MED ORDER — INSULIN GLARGINE 100 UNIT/ML ~~LOC~~ SOLN
30.0000 [IU] | Freq: Every day | SUBCUTANEOUS | Status: DC
  Administered 2019-12-29: 30 [IU] via SUBCUTANEOUS
  Filled 2019-12-29 (×2): qty 0.3

## 2019-12-29 MED ORDER — INSULIN ASPART 100 UNIT/ML ~~LOC~~ SOLN
0.0000 [IU] | SUBCUTANEOUS | Status: DC
  Administered 2019-12-29: 20 [IU] via SUBCUTANEOUS
  Administered 2019-12-29: 15 [IU] via SUBCUTANEOUS
  Administered 2019-12-29 (×3): 20 [IU] via SUBCUTANEOUS
  Administered 2019-12-30 (×2): 15 [IU] via SUBCUTANEOUS

## 2019-12-29 MED ORDER — INSULIN GLARGINE 100 UNIT/ML ~~LOC~~ SOLN
10.0000 [IU] | SUBCUTANEOUS | Status: AC
  Administered 2019-12-29: 10 [IU] via SUBCUTANEOUS
  Filled 2019-12-29 (×2): qty 0.1

## 2019-12-29 NOTE — Progress Notes (Signed)
Pt receiving solumedrol. Last two CBG's were greater than 450. Paged Dr. Robb Matar to make him aware.

## 2019-12-29 NOTE — Progress Notes (Addendum)
Pt will not leave BP cuff on. Keeps throwing it onto bed stating "it is too tight." Educated patient. Pt also inquired about home meds. Per pharmacy, her home meds have been reviewed but the doctors need to order her meds.

## 2019-12-29 NOTE — Progress Notes (Signed)
Initial Nutrition Assessment  DOCUMENTATION CODES:   Obesity unspecified  INTERVENTION:  -Glucerna Shake po BID, each supplement provides 220 kcal and 10 grams of protein  -MVI with minerals daily  NUTRITION DIAGNOSIS:   Increased nutrient needs related to acute illness, chronic illness(acute on chronic respiratory failure with hypoxia and hypercapnia) as evidenced by estimated needs.    GOAL:   Patient will meet greater than or equal to 90% of their needs    MONITOR:   PO intake, Weight trends, Supplement acceptance, Labs, I & O's  REASON FOR ASSESSMENT:   Consult Assessment of nutrition requirement/status  ASSESSMENT:  RD working remotely.  44 year old female with past medical history of asthma, axillary adenopathy, moderate pulmonary hypertension, chronic right-sided CHF with cor pulmonale, COPD, T2DM, DVT, hepatic cirrhosis, h/o chronic alcoholism, migraine headaches, OSA on home BiPAP at [facility], periodontal disease, h/o PE (2013) presented to ED with worsening baseline dyspnea for 3-4 days. In ED, pt received supplemental oxygen, albuterol MDI x 2 followed by nebulizer treatment and placed on BiPAP ventilation. Patient admitted for acute on chronic respiratory failure with hypoxia and hypercapnia.  Per flowsheets, pt SpO2 94% on 96 L/min [place]. Patient MRSA PCR (+), protocol initiated.   Patient eating 100% x 1 documented meal this admission. Unable to obtain nutrition history at this time secondary to RD working remotely, unable to contact pt via phone due to pt in ICU. HH/CM diet order provides approximately 1650 kcal daily and 86 grams protein. Will provide Glucerna to aid with calorie/protein needs. Patient with good glucose control prior to admission, last A1c 6.9; noted elevated CBGs, currently pt on high-dose steroids.  Current wt 99.8 kg (219.56 lbs) Unsure if current wt is stated, current weight + 30 lbs of UBW. No edema noted per RN flowsheet  Medications  reviewed and include: SS novolog, Lantus 20 units daily, Methylpredisolone, Miralax  Labs: CBGs [phone] x 24 hrs, Na 132 (L) BNP 117 (H) Lab Results  Component Value Date   HGBA1C 6.9 (H) 12/28/2019    NUTRITION - FOCUSED PHYSICAL EXAM: Unable to complete at this time, RD working remotely.   Diet Order:   Diet Order            Diet heart healthy/carb modified Room service appropriate? Yes; Fluid consistency: Thin  Diet effective now              EDUCATION NEEDS:   No education needs have been identified at this time  Skin:  Skin Assessment: Reviewed RN Assessment  Last BM:  1/22  Height:   Ht Readings from Last 1 Encounters:  12/28/19 5' 2.5" (1.588 m)    Weight:   Wt Readings from Last 1 Encounters:  12/28/19 99.8 kg    Ideal Body Weight:  51.4 kg  BMI:  Body mass index is 39.6 kg/m.  Estimated Nutritional Needs:   Kcal:  1950-2270 (MSJ x 1.2-1.4)  Protein:  98-120  Fluid:  >/= 1.9 L/day   Lars Masson, RD, LDN Clinical Nutrition [facility] Telephone [phone] After Hours/Weekend Pager: [phone]

## 2019-12-29 NOTE — Progress Notes (Signed)
PROGRESS NOTE    Mary [name]  WJX:[id number] DOB: 02/25/1976 DOA: 12/28/2019 PCP: Corwin LevinsJohn, [name], MD    Brief Narrative:  44 year old female with a history of pulmonary hypertension, right-sided heart failure, COPD, diabetes, obstructive sleep apnea on BiPAP at bedtime, presents to the hospital with progressive shortness of breath.  She is found to have COPD exacerbation and associated acute on chronic respiratory failure.  She was noted to have hypercapnia with elevated PCO2 above baseline.  She was put on continuous BiPAP and admitted for further treatments.   Assessment & Plan:   Principal Problem:   Acute on chronic respiratory failure with hypoxia and hypercapnia (HCC) Active Problems:   Tobacco use   OSA on BiPAP   Pulmonary hypertension (HCC)   Diabetes mellitus type 2 in obese (HCC)   Diabetic neuropathy (HCC)   Thrombocytopenia (HCC)   COPD with acute exacerbation (HCC)   Abnormal LFTs (liver function tests)   1. Acute on chronic respiratory failure with hypoxia and hypercapnia.  Secondary to COPD exacerbation.  Patient initially required continuous BiPAP, but has now been placed on nasal cannula.  Continue to wean down to her baseline oxygen requirement.  Goal O2 saturation should be between 88 to 92%. 2. COPD exacerbation.  Continues to have diminished breath sounds with wheezes.  Continue on Solu-Medrol, bronchodilators.  Will start a course of azithromycin. 3. Obstructive sleep apnea on nightly BiPAP. 4. Pulmonary hypertension.  Echocardiogram shows normal ejection fraction.  Likely secondary to severe COPD and obstructive sleep apnea. 5. Diabetic neuropathy.  Continue gabapentin 6. Diabetes.  A1c of 6.9.  Blood sugars are elevated in setting of steroids.  Resume home dose of NovoLog.  Continue on Lantus and sliding scale insulin. 7. Thrombocytopenia. Due to hepatosplenomegaly.  Continue to follow.  DVT prophylaxis: SCDs Code Status: Full code Family  Communication: Discussed with patient Disposition Plan: Discharge home once respiratory status is approaching baseline   Consultants:     Procedures:  Echocardiogram:1. Left ventricular ejection fraction, by visual estimation, is 60 to 65%. The left ventricle has normal function. There is mildly increased left ventricular hypertrophy. Suboptimal study with poor endocardial and valvular visualization.  2. Global right ventricle has normal systolic function.The right ventricular size is mildly enlarged. Right vetricular wall thickness was not assessed.  3. Left atrial size was normal.  4. Right atrial size was mildly dilated.  5. The mitral valve is grossly normal. No evidence of mitral valve regurgitation.  6. The tricuspid valve is not well visualized.  7. The tricuspid valve is not well visualized. Tricuspid valve regurgitation is trivial.  8. The aortic valve was not well visualized. Aortic valve regurgitation is not visualized.  9. The pulmonic valve was not well visualized. Pulmonic valve regurgitation is not visualized. 10. The inferior vena cava is normal in size with greater than 50% respiratory variability, suggesting right atrial pressure of 3 mmHg.  11. The interatrial septum was not well visualized  Antimicrobials:   Azithromycin 1/23 >   Subjective: Patient is feeling better today.  Still feels short of breath.  Was on continuous BiPAP until early this morning.  Still has wheezing.  He needs to have cough.  Objective: Vitals:   12/29/19 1400 12/29/19 1417 12/29/19 1600 12/29/19 1700  BP:      Pulse: 93  99 88  Resp: (!) 21  15 18   Temp:   98.3 F (36.8 C)   TempSrc:   Oral   SpO2: 93% (!) 89%  90% 94%  Weight:      Height:        Intake/Output Summary (Last 24 hours) at 12/29/2019 1905 Last data filed at 12/29/2019 1800 Gross per 24 hour  Intake 780 ml  Output -  Net 780 ml   Filed Weights   12/28/19 0155  Weight: 99.8 kg    Examination:  General  exam: Appears calm and comfortable  Respiratory system: Diminished breath sounds with wheeze bilaterally. Respiratory effort normal. Cardiovascular system: S1 & S2 heard, RRR. No JVD, murmurs, rubs, gallops or clicks. No pedal edema. Gastrointestinal system: Abdomen is nondistended, soft and nontender. No organomegaly or masses felt. Normal bowel sounds heard. Central nervous system: Alert and oriented. No focal neurological deficits. Extremities: Symmetric 5 x 5 power. Skin: No rashes, lesions or ulcers Psychiatry: Judgement and insight appear normal. Mood & affect appropriate.     Data Reviewed: I have personally reviewed following labs and imaging studies  CBC: Recent Labs  Lab 12/28/19 0218 12/29/19 0413  WBC 7.7 7.4  NEUTROABS 4.8 6.5  HGB 13.2 12.4  HCT 40.8 40.1  MCV 105.4* 105.5*  PLT 111* 86*   Basic Metabolic Panel: Recent Labs  Lab 12/28/19 0218 12/28/19 0223 12/29/19 0413  NA 134*  --  132*  K 3.5  --  3.9  CL 86*  --  84*  CO2 37*  --  37*  GLUCOSE 260*  --  340*  BUN 6  --  14  CREATININE 0.51  --  0.55  CALCIUM 8.6*  --  8.8*  MG  --  1.7  --   PHOS  --  4.0  --    GFR: Estimated Creatinine Clearance: 102.2 mL/min (by C-G formula based on SCr of 0.55 mg/dL). Liver Function Tests: Recent Labs  Lab 12/28/19 0218 12/29/19 0413  AST 55* 32  ALT 24 23  ALKPHOS 131* 115  BILITOT 1.5* 1.9*  PROT 8.2* 8.1  ALBUMIN 3.5 3.6   No results for input(s): LIPASE, AMYLASE in the last 168 hours. No results for input(s): AMMONIA in the last 168 hours. Coagulation Profile: Recent Labs  Lab 12/28/19 0223  INR 1.0   Cardiac Enzymes: No results for input(s): CKTOTAL, CKMB, CKMBINDEX, TROPONINI in the last 168 hours. BNP (last 3 results) No results for input(s): PROBNP in the last 8760 hours. HbA1C: Recent Labs    12/28/19 0223  HGBA1C 6.9*   CBG: Recent Labs  Lab 12/29/19 0053 12/29/19 0330 12/29/19 0749 12/29/19 1146 12/29/19 1631  GLUCAP  459* 362* 318* 411* 494*   Lipid Profile: Recent Labs    12/28/19 0218  TRIG 210*   Thyroid Function Tests: No results for input(s): TSH, T4TOTAL, FREET4, T3FREE, THYROIDAB in the last 72 hours. Anemia Panel: Recent Labs    12/28/19 0218  FERRITIN 31   Sepsis Labs: Recent Labs  Lab 12/28/19 0218 12/28/19 0505  PROCALCITON <0.10  --   LATICACIDVEN 2.6* 1.2    Recent Results (from the past 240 hour(s))  Blood Culture (routine x 2)     Status: None (Preliminary result)   Collection Time: 12/28/19  2:18 AM   Specimen: BLOOD RIGHT HAND  Result Value Ref Range Status   Specimen Description BLOOD RIGHT HAND  Final   Special Requests   Final    BOTTLES DRAWN AEROBIC AND ANAEROBIC Blood Culture adequate volume   Culture   Final    NO GROWTH 1 DAY Performed at [facility], [address] [place],  [place] [address]    Report Status PENDING  Incomplete  Blood Culture (routine x 2)     Status: None (Preliminary result)   Collection Time: 12/28/19  2:23 AM   Specimen: BLOOD RIGHT FOREARM  Result Value Ref Range Status   Specimen Description BLOOD RIGHT FOREARM  Final   Special Requests   Final    BOTTLES DRAWN AEROBIC AND ANAEROBIC Blood Culture adequate volume   Culture   Final    NO GROWTH 1 DAY Performed at [facility], [address] [place], [place] [address]    Report Status PENDING  Incomplete  Respiratory Panel by RT PCR (Flu A&B, Covid) - Nasopharyngeal Swab     Status: None   Collection Time: 12/28/19  2:29 AM   Specimen: Nasopharyngeal Swab  Result Value Ref Range Status   SARS Coronavirus 2 by RT PCR NEGATIVE NEGATIVE Final    Comment: (NOTE) SARS-CoV-2 target nucleic acids are NOT DETECTED. The SARS-CoV-2 RNA is generally detectable in upper respiratoy specimens during the acute phase of infection. The lowest concentration of SARS-CoV-2 viral copies this assay can detect is 131 copies/mL. A negative result does not preclude SARS-Cov-2 infection and should  not be used as the sole basis for treatment or other patient management decisions. A negative result may occur with  improper specimen collection/handling, submission of specimen other than nasopharyngeal swab, presence of viral mutation(s) within the areas targeted by this assay, and inadequate number of viral copies (<131 copies/mL). A negative result must be combined with clinical observations, patient history, and epidemiological information. The expected result is Negative. Fact Sheet for Patients:  https://www.moore.com/ Fact Sheet for Healthcare Providers:  https://www.young.biz/ This test is not yet ap proved or cleared by the [place] FDA and  has been authorized for detection and/or diagnosis of SARS-CoV-2 by FDA under an Emergency Use Authorization (EUA). This EUA will remain  in effect (meaning this test can be used) for the duration of the COVID-19 declaration under Section 564(b)(1) of the Act, 21 U.S.C. section 360bbb-3(b)(1), unless the authorization is terminated or revoked sooner.    Influenza A by PCR NEGATIVE NEGATIVE Final   Influenza B by PCR NEGATIVE NEGATIVE Final    Comment: (NOTE) The Xpert Xpress SARS-CoV-2/FLU/RSV assay is intended as an aid in  the diagnosis of influenza from Nasopharyngeal swab specimens and  should not be used as a sole basis for treatment. Nasal washings and  aspirates are unacceptable for Xpert Xpress SARS-CoV-2/FLU/RSV  testing. Fact Sheet for Patients: https://www.moore.com/ Fact Sheet for Healthcare Providers: https://www.young.biz/ This test is not yet approved or cleared by the [place] FDA and  has been authorized for detection and/or diagnosis of SARS-CoV-2 by  FDA under an Emergency Use Authorization (EUA). This EUA will remain  in effect (meaning this test can be used) for the duration of the  Covid-19 declaration under Section 564(b)(1)  of the Act, 21  U.S.C. section 360bbb-3(b)(1), unless the authorization is  terminated or revoked. Performed at [facility], [address], [place], [place] [address]   MRSA PCR Screening     Status: Abnormal   Collection Time: 12/28/19  3:53 PM   Specimen: Nasal Mucosa; Nasopharyngeal  Result Value Ref Range Status   MRSA by PCR POSITIVE (A) NEGATIVE Final    Comment:        The GeneXpert MRSA Assay (FDA approved for NASAL specimens only), is one component of a comprehensive MRSA colonization surveillance program. It is not intended to diagnose  MRSA infection nor to guide or monitor treatment for MRSA infections. RESULT CALLED TO, READ BACK BY AND VERIFIED WITH: FOLEY @ 0940 ON [id number] BY HENDERSON L. Performed at [facility], [address] [place], [place] [address]          Radiology Studies: CT Angio Chest PE W/Cm &/Or Wo Cm  Result Date: 12/28/2019 CLINICAL DATA:  Shortness of breath. Elevated D-dimer. Cough. History of pulmonary embolus. EXAM: CT ANGIOGRAPHY CHEST WITH CONTRAST TECHNIQUE: Multidetector CT imaging of the chest was performed using the standard protocol during bolus administration of intravenous contrast. Multiplanar CT image reconstructions and MIPs were obtained to evaluate the vascular anatomy. CONTRAST:  OMNIPAQUE IOHEXOL 350 MG/ML SOLN COMPARISON:  Radiograph earlier this day. Most recent chest CT 02/08/2018 FINDINGS: Cardiovascular: Right upper extremity injection. Dense contrast refluxes into right chest wall collaterals, also seen on prior exam. No evidence of SVC occlusion. Patient has history of right subclavian DVT. Chronic dilatation of the main pulmonary artery at 4.1 cm. There are no filling defects within the pulmonary arteries to suggest acute pulmonary embolus. Evaluation is diagnostic to the proximal segmental level. Mild cardiomegaly. Slight leftward heart deviation unchanged. No pericardial effusion. No aortic dissection.  Mediastinum/Nodes: Multiple small mediastinal lymph nodes, not enlarged by size criteria. Multiple small mediastinal collaterals. No esophageal wall thickening. Thyroid gland not visualized. Lungs/Pleura: Heterogeneous pulmonary parenchyma with areas of architectural distortion. Scattered areas of pulmonary scarring. No acute airspace disease or pleural effusion. No pulmonary edema. Mild distal bronchial wall thickening. No pulmonary mass. Stable area of ground-glass in the posterior segment of the right upper lobe dating back to 2018 and considered benign. Upper Abdomen: Partially included hepatosplenomegaly and hepatic steatosis. Musculoskeletal: There are no acute or suspicious osseous abnormalities. Review of the MIP images confirms the above findings. IMPRESSION: 1. No acute pulmonary embolus.  No acute intrathoracic abnormality. 2. Chronic dilatation of the main pulmonary artery, suggesting pulmonary arterial hypertension. 3. Heterogeneous pulmonary parenchyma with areas of architectural distortion in pulmonary scarring, not significantly changed from 2018 high-resolution chest CT. 4. Partially included hepatosplenomegaly and hepatic steatosis. 5. Stable ground-glass nodule in the right upper lobe dating back to 2018 consistent with benign etiology. No further follow-up is needed if patient is low risk. Non-contrast chest CT can be considered in 12 months. This recommendation follows the consensus statement: Guidelines for Management of Incidental Pulmonary Nodules Detected on CT Images: From the Fleischner Society 2017; Radiology 2017; 284:228-243. 6. Stable right anterior chest wall collaterals with reflux of IV contrast. Stable small mediastinal collaterals. This is unchanged from 02/08/2018 chest CTA. Electronically Signed   By: Narda Rutherford M.D.   On: 12/28/2019 04:30   DG Chest Port 1 View  Result Date: 12/28/2019 CLINICAL DATA:  Shortness of breath EXAM: PORTABLE CHEST 1 VIEW COMPARISON:   12/28/2018 FINDINGS: The heart size and mediastinal contours are within normal limits. Both lungs are clear. The visualized skeletal structures are unremarkable. IMPRESSION: No active disease. Electronically Signed   By: Deatra Robinson M.D.   On: 12/28/2019 03:01   ECHOCARDIOGRAM COMPLETE  Result Date: 12/28/2019   ECHOCARDIOGRAM REPORT   Patient Name:   SANDRA BRENTS Date of Exam: 12/28/2019 Medical Rec #:  [id number]         Height:       62.5 in Accession #:    [id number]        Weight:       220.0 lb Date of Birth:  [birth year]  BSA:          2.00 m Patient Age:    43 years          BP:           127/71 mmHg Patient Gender: F                 HR:           93 bpm. Exam Location:  [facility] Procedure: 2D Echo Indications:    Pulmonary hypertension 416.8 / I27.2                 Abnormal ECG 794.31 / R94.31  History:        Patient has prior history of Echocardiogram examinations, most                 recent 02/01/2017. COPD; Risk Factors:Former Smoker and Diabetes.                 ETOH, Pulmonary mass, Influenza A, Congenital heart disease,                 PHTN.  Sonographer:    Leavy Cella RDCS (AE) Referring Phys: [id number] DAVID MANUEL [place]  1. Left ventricular ejection fraction, by visual estimation, is 60 to 65%. The left ventricle has normal function. There is mildly increased left ventricular hypertrophy. Suboptimal study with poor endocardial and valvular visualization.  2. Global right ventricle has normal systolic function.The right ventricular size is mildly enlarged. Right vetricular wall thickness was not assessed.  3. Left atrial size was normal.  4. Right atrial size was mildly dilated.  5. The mitral valve is grossly normal. No evidence of mitral valve regurgitation.  6. The tricuspid valve is not well visualized.  7. The tricuspid valve is not well visualized. Tricuspid valve regurgitation is trivial.  8. The aortic valve was not well visualized. Aortic valve regurgitation  is not visualized.  9. The pulmonic valve was not well visualized. Pulmonic valve regurgitation is not visualized. 10. The inferior vena cava is normal in size with greater than 50% respiratory variability, suggesting right atrial pressure of 3 mmHg. 11. The interatrial septum was not well visualized. FINDINGS  Left Ventricle: Left ventricular ejection fraction, by visual estimation, is 60 to 65%. The left ventricle has normal function. The left ventricle is not well visualized. The left ventricular internal cavity size was the left ventricle is normal in size. There is mildly increased left ventricular hypertrophy. Concentric left ventricular hypertrophy. Left ventricular diastolic parameters were normal. Right Ventricle: The right ventricular size is mildly enlarged. Right vetricular wall thickness was not assessed. Global RV systolic function is has normal systolic function. Left Atrium: Left atrial size was normal in size. Right Atrium: Right atrial size was mildly dilated Pericardium: There is no evidence of pericardial effusion. Mitral Valve: The mitral valve is grossly normal. No evidence of mitral valve regurgitation. Tricuspid Valve: The tricuspid valve is not well visualized. Tricuspid valve regurgitation is trivial. Aortic Valve: The aortic valve was not well visualized. Aortic valve regurgitation is not visualized. Pulmonic Valve: The pulmonic valve was not well visualized. Pulmonic valve regurgitation is not visualized. Pulmonic regurgitation is not visualized. Aorta: The aortic root is normal in size and structure. Venous: The inferior vena cava was not well visualized. The inferior vena cava is normal in size with greater than 50% respiratory variability, suggesting right atrial pressure of 3 mmHg. IAS/Shunts: The interatrial septum was not well visualized.  LEFT  VENTRICLE PLAX 2D LVIDd:         5.01 cm  Diastology LVIDs:         2.52 cm  LV e' lateral:   8.27 cm/s LV PW:         1.38 cm  LV E/e'  lateral: 13.9 LV IVS:        1.00 cm  LV e' medial:    8.27 cm/s LVOT diam:     2.50 cm  LV E/e' medial:  13.9 LV SV:         96 ml LV SV Index:   44.66 LVOT Area:     4.91 cm  RIGHT VENTRICLE RV S prime:     15.50 cm/s TAPSE (M-mode): 2.3 cm LEFT ATRIUM             Index       RIGHT ATRIUM           Index LA diam:        4.30 cm 2.15 cm/m  RA Area:     18.90 cm LA Vol (A2C):   60.0 ml 29.96 ml/m RA Volume:   51.20 ml  25.57 ml/m LA Vol (A4C):   54.4 ml 27.16 ml/m LA Biplane Vol: 57.2 ml 28.56 ml/m   AORTA Ao Root diam: 3.50 cm MITRAL VALVE MV Area (PHT): 2.83 cm              SHUNTS MV PHT:        77.72 msec            Systemic Diam: 2.50 cm MV Decel Time: 268 msec MV E velocity: 115.00 cm/s 103 cm/s MV A velocity: 116.00 cm/s 70.3 cm/s MV E/A ratio:  0.99        1.5  Prentice DockerSuresh Koneswaran MD Electronically signed by Prentice DockerSuresh Koneswaran MD Signature Date/Time: 12/28/2019/11:34:29 AM    Final         Scheduled Meds: . Melene Muller[START ON 12/30/2019] azithromycin  250 mg Oral Daily  . budesonide (PULMICORT) nebulizer solution  0.25 mg Nebulization BID  . Chlorhexidine Gluconate Cloth  6 each Topical Daily  . Chlorhexidine Gluconate Cloth  6 each Topical Q0600  . feeding supplement (GLUCERNA SHAKE)  237 mL Oral BID BM  . gabapentin  600 mg Oral TID  . guaiFENesin  600 mg Oral BID  . insulin aspart  0-20 Units Subcutaneous Q4H  . insulin aspart  15 Units Subcutaneous TID WC  . insulin glargine  30 Units Subcutaneous QHS  . ipratropium-albuterol  3 mL Nebulization Q6H  . methylPREDNISolone (SOLU-MEDROL) injection  60 mg Intravenous Q12H  . multivitamin with minerals  1 tablet Oral Daily  . mupirocin ointment  1 application Nasal BID  . pantoprazole  40 mg Oral Daily  . potassium chloride  10 mEq Oral Q48H  . torsemide  20 mg Oral Daily   Continuous Infusions:   LOS: 1 day    Time spent: 35mins    Erick BlinksJehanzeb , MD [facility]   If 7PM-7AM, please contact night-coverage [email or web address]   12/29/2019, 7:05 PM

## 2019-12-29 NOTE — Progress Notes (Signed)
Clance Boll, PA notified of CBG 515 with lab verification 515.  Orders received.

## 2019-12-29 NOTE — Progress Notes (Signed)
CRITICAL VALUE ALERT  Critical Value:  MRSA PCR (+)  Date & Time Notied:  12/29/19 @ 0940  Provider Notified: Kerry Hough, MD.  Orders Received/Actions taken:  MRSA PCR (+) standing order protocol started.

## 2019-12-30 LAB — GLUCOSE, CAPILLARY
Glucose-Capillary: 306 mg/dL — ABNORMAL HIGH (ref 70–99)
Glucose-Capillary: 331 mg/dL — ABNORMAL HIGH (ref 70–99)
Glucose-Capillary: 335 mg/dL — ABNORMAL HIGH (ref 70–99)
Glucose-Capillary: 400 mg/dL — ABNORMAL HIGH (ref 70–99)
Glucose-Capillary: 443 mg/dL — ABNORMAL HIGH (ref 70–99)
Glucose-Capillary: 503 mg/dL (ref 70–99)

## 2019-12-30 LAB — BASIC METABOLIC PANEL
Anion gap: 12 (ref 5–15)
BUN: 16 mg/dL (ref 6–20)
CO2: 35 mmol/L — ABNORMAL HIGH (ref 22–32)
Calcium: 9.1 mg/dL (ref 8.9–10.3)
Chloride: 82 mmol/L — ABNORMAL LOW (ref 98–111)
Creatinine, Ser: 0.56 mg/dL (ref 0.44–1.00)
GFR calc Af Amer: >60 mL/min (ref >60)
GFR calc non Af Amer: >60 mL/min (ref >60)
Glucose, Bld: 450 mg/dL — ABNORMAL HIGH (ref 70–99)
Potassium: 4.7 mmol/L (ref 3.5–5.1)
Sodium: 129 mmol/L — ABNORMAL LOW (ref 135–145)

## 2019-12-30 LAB — GLUCOSE, RANDOM: Glucose, Bld: 436 mg/dL — ABNORMAL HIGH (ref 70–99)

## 2019-12-30 MED ORDER — INSULIN GLARGINE 100 UNIT/ML ~~LOC~~ SOLN
40.0000 [IU] | Freq: Every day | SUBCUTANEOUS | Status: DC
  Administered 2019-12-30: 40 [IU] via SUBCUTANEOUS
  Filled 2019-12-30 (×2): qty 0.4

## 2019-12-30 MED ORDER — INSULIN ASPART 100 UNIT/ML ~~LOC~~ SOLN
20.0000 [IU] | Freq: Once | SUBCUTANEOUS | Status: AC
  Administered 2019-12-30: 20 [IU] via SUBCUTANEOUS

## 2019-12-30 MED ORDER — INSULIN ASPART 100 UNIT/ML ~~LOC~~ SOLN
0.0000 [IU] | Freq: Every day | SUBCUTANEOUS | Status: DC
  Administered 2019-12-30: 5 [IU] via SUBCUTANEOUS

## 2019-12-30 MED ORDER — INSULIN ASPART 100 UNIT/ML ~~LOC~~ SOLN
20.0000 [IU] | Freq: Three times a day (TID) | SUBCUTANEOUS | Status: DC
  Administered 2019-12-30 – 2019-12-31 (×3): 20 [IU] via SUBCUTANEOUS

## 2019-12-30 MED ORDER — PREDNISONE 20 MG PO TABS
40.0000 mg | ORAL_TABLET | Freq: Every day | ORAL | Status: DC
  Administered 2019-12-31: 40 mg via ORAL
  Filled 2019-12-30: qty 2

## 2019-12-30 MED ORDER — INSULIN GLARGINE 100 UNIT/ML ~~LOC~~ SOLN
10.0000 [IU] | SUBCUTANEOUS | Status: AC
  Administered 2019-12-30: 10 [IU] via SUBCUTANEOUS
  Filled 2019-12-30 (×2): qty 0.1

## 2019-12-30 MED ORDER — INSULIN ASPART 100 UNIT/ML ~~LOC~~ SOLN
0.0000 [IU] | Freq: Three times a day (TID) | SUBCUTANEOUS | Status: DC
  Administered 2019-12-30: 17:00:00 15 [IU] via SUBCUTANEOUS
  Administered 2019-12-30: 12:00:00 20 [IU] via SUBCUTANEOUS
  Administered 2019-12-31: 4 [IU] via SUBCUTANEOUS

## 2019-12-30 NOTE — Progress Notes (Signed)
SATURATION QUALIFICATIONS: (This note is used to comply with regulatory documentation for home oxygen)  Patient Saturations on Room Air at Rest = n/a (on chronic 3L [place] already)  Patient Saturations on Room Air while Ambulating = n/a (on chronic 3L [place] already)  Patient Saturations on 3 Liters of oxygen while Ambulating = 97%  Please briefly explain why patient needs home oxygen:  Patient currently uses 3L [place] chronic at home. Patient requested to ambulate around the unit. Patient was able to ambulate twice around the unit with no difficulties. Saturations remained in the higher 90s on the 3L of oxygen. Patient states she "feels safe to go home now".

## 2019-12-30 NOTE — Progress Notes (Signed)
Clance Boll, PA notified of CBG 443 and lab verification 436.

## 2019-12-30 NOTE — Progress Notes (Signed)
PROGRESS NOTE    Mary [name]  XHB:[id number] DOB: 1976/06/13 DOA: 12/28/2019 PCP: Corwin Levins, MD    Brief Narrative:  44 year old female with a history of pulmonary hypertension, right-sided heart failure, COPD, diabetes, obstructive sleep apnea on BiPAP at bedtime, presents to the hospital with progressive shortness of breath.  She is found to have COPD exacerbation and associated acute on chronic respiratory failure.  She was noted to have hypercapnia with elevated PCO2 above baseline.  She was put on continuous BiPAP and admitted for further treatments.   Assessment & Plan:   Principal Problem:   Acute on chronic respiratory failure with hypoxia and hypercapnia (HCC) Active Problems:   Tobacco use   OSA on BiPAP   Pulmonary hypertension (HCC)   Diabetes mellitus type 2 in obese (HCC)   Diabetic neuropathy (HCC)   Thrombocytopenia (HCC)   COPD with acute exacerbation (HCC)   Abnormal LFTs (liver function tests)   1. Acute on chronic respiratory failure with hypoxia and hypercapnia.  Secondary to COPD exacerbation.  Patient initially required continuous BiPAP, but has now been placed on nasal cannula.  Continue to wean down to her baseline oxygen requirement.  Goal O2 saturation should be between 88 to 92%. 2. COPD exacerbation.  Shortness of breath and wheezing improving.  Will change Solu-Medrol to prednisone.  Continue bronchodilators, mucolytics and antibiotics. . 3. Obstructive sleep apnea on nightly BiPAP. 4. Pulmonary hypertension.  Echocardiogram shows normal ejection fraction.  Likely secondary to severe COPD and obstructive sleep apnea. 5. Diabetic neuropathy.  Continue gabapentin 6. Diabetes.  A1c of 6.9.  Blood sugars are elevated in setting of steroids.  Continue to adjust Lantus and NovoLog.  Continue sliding scale insulin 7. Thrombocytopenia. Due to hepatosplenomegaly.  Continue to follow.  DVT prophylaxis: SCDs Code Status: Full code Family Communication:  Discussed with patient Disposition Plan: Discharge home once respiratory status is approaching baseline, anticipate discharge in a.m.  Transfer to MedSurg bed today   Consultants:     Procedures:  Echocardiogram:1. Left ventricular ejection fraction, by visual estimation, is 60 to 65%. The left ventricle has normal function. There is mildly increased left ventricular hypertrophy. Suboptimal study with poor endocardial and valvular visualization.  2. Global right ventricle has normal systolic function.The right ventricular size is mildly enlarged. Right vetricular wall thickness was not assessed.  3. Left atrial size was normal.  4. Right atrial size was mildly dilated.  5. The mitral valve is grossly normal. No evidence of mitral valve regurgitation.  6. The tricuspid valve is not well visualized.  7. The tricuspid valve is not well visualized. Tricuspid valve regurgitation is trivial.  8. The aortic valve was not well visualized. Aortic valve regurgitation is not visualized.  9. The pulmonic valve was not well visualized. Pulmonic valve regurgitation is not visualized. 10. The inferior vena cava is normal in size with greater than 50% respiratory variability, suggesting right atrial pressure of 3 mmHg.  11. The interatrial septum was not well visualized  Antimicrobials:   Azithromycin 1/23 >   Subjective: She feels that her breathing is improving today.  She did wear her BiPAP last night.  Wheezing is better.  Blood sugars been running high overnight.  Objective: Vitals:   12/30/19 0804 12/30/19 0900 12/30/19 0910 12/30/19 0943  BP: 111/78     Pulse: 84 66  75  Resp: 19 (!) 23    Temp:      TempSrc:      SpO2: 93% 100%  100% 100%  Weight:      Height:        Intake/Output Summary (Last 24 hours) at 12/30/2019 1039 Last data filed at 12/30/2019 0830 Gross per 24 hour  Intake 2143 ml  Output -  Net 2143 ml   Filed Weights   12/28/19 0155 12/30/19 0500  Weight: 99.8 kg  98.8 kg    Examination:  General exam: Alert, awake, oriented x 3 Respiratory system: Clear to auscultation. Respiratory effort normal. Cardiovascular system:RRR. No murmurs, rubs, gallops. Gastrointestinal system: Abdomen is nondistended, soft and nontender. No organomegaly or masses felt. Normal bowel sounds heard. Central nervous system: Alert and oriented. No focal neurological deficits. Extremities: No C/C/E, +pedal pulses Skin: No rashes, lesions or ulcers Psychiatry: Judgement and insight appear normal. Mood & affect appropriate.       Data Reviewed: I have personally reviewed following labs and imaging studies  CBC: Recent Labs  Lab 12/28/19 0218 12/29/19 0413  WBC 7.7 7.4  NEUTROABS 4.8 6.5  HGB 13.2 12.4  HCT 40.8 40.1  MCV 105.4* 105.5*  PLT 111* 86*   Basic Metabolic Panel: Recent Labs  Lab 12/28/19 0218 12/28/19 0223 12/29/19 0413 12/29/19 2029 12/30/19 0025 12/30/19 0938  NA 134*  --  132*  --   --  129*  K 3.5  --  3.9  --   --  4.7  CL 86*  --  84*  --   --  82*  CO2 37*  --  37*  --   --  35*  GLUCOSE 260*  --  340* 515* 436* 450*  BUN 6  --  14  --   --  16  CREATININE 0.51  --  0.55  --   --  0.56  CALCIUM 8.6*  --  8.8*  --   --  9.1  MG  --  1.7  --   --   --   --   PHOS  --  4.0  --   --   --   --    GFR: Estimated Creatinine Clearance: 101.7 mL/min (by C-G formula based on SCr of 0.56 mg/dL). Liver Function Tests: Recent Labs  Lab 12/28/19 0218 12/29/19 0413  AST 55* 32  ALT 24 23  ALKPHOS 131* 115  BILITOT 1.5* 1.9*  PROT 8.2* 8.1  ALBUMIN 3.5 3.6   No results for input(s): LIPASE, AMYLASE in the last 168 hours. No results for input(s): AMMONIA in the last 168 hours. Coagulation Profile: Recent Labs  Lab 12/28/19 0223  INR 1.0   Cardiac Enzymes: No results for input(s): CKTOTAL, CKMB, CKMBINDEX, TROPONINI in the last 168 hours. BNP (last 3 results) No results for input(s): PROBNP in the last 8760 hours. HbA1C:  Recent Labs    12/28/19 0223  HGBA1C 6.9*   CBG: Recent Labs  Lab 12/29/19 2016 12/29/19 2018 12/30/19 0008 12/30/19 0423 12/30/19 0751  GLUCAP 515* 500* 443* 306* 331*   Lipid Profile: Recent Labs    12/28/19 0218  TRIG 210*   Thyroid Function Tests: No results for input(s): TSH, T4TOTAL, FREET4, T3FREE, THYROIDAB in the last 72 hours. Anemia Panel: Recent Labs    12/28/19 0218  FERRITIN 31   Sepsis Labs: Recent Labs  Lab 12/28/19 0218 12/28/19 0505  PROCALCITON <0.10  --   LATICACIDVEN 2.6* 1.2    Recent Results (from the past 240 hour(s))  Blood Culture (routine x 2)     Status: None (Preliminary result)   Collection  Time: 12/28/19  2:18 AM   Specimen: BLOOD RIGHT HAND  Result Value Ref Range Status   Specimen Description BLOOD RIGHT HAND  Final   Special Requests   Final    BOTTLES DRAWN AEROBIC AND ANAEROBIC Blood Culture adequate volume   Culture   Final    NO GROWTH 2 DAYS Performed at [facility], [address], [place], [place] [address]    Report Status PENDING  Incomplete  Blood Culture (routine x 2)     Status: None (Preliminary result)   Collection Time: 12/28/19  2:23 AM   Specimen: BLOOD RIGHT FOREARM  Result Value Ref Range Status   Specimen Description BLOOD RIGHT FOREARM  Final   Special Requests   Final    BOTTLES DRAWN AEROBIC AND ANAEROBIC Blood Culture adequate volume   Culture   Final    NO GROWTH 2 DAYS Performed at [facility], [address], [place], [place] [address]    Report Status PENDING  Incomplete  Respiratory Panel by RT PCR (Flu A&B, Covid) - Nasopharyngeal Swab     Status: None   Collection Time: 12/28/19  2:29 AM   Specimen: Nasopharyngeal Swab  Result Value Ref Range Status   SARS Coronavirus 2 by RT PCR NEGATIVE NEGATIVE Final    Comment: (NOTE) SARS-CoV-2 target nucleic acids are NOT DETECTED. The SARS-CoV-2 RNA is generally detectable in upper respiratoy specimens during the acute phase of infection.  The lowest concentration of SARS-CoV-2 viral copies this assay can detect is 131 copies/mL. A negative result does not preclude SARS-Cov-2 infection and should not be used as the sole basis for treatment or other patient management decisions. A negative result may occur with  improper specimen collection/handling, submission of specimen other than nasopharyngeal swab, presence of viral mutation(s) within the areas targeted by this assay, and inadequate number of viral copies (<131 copies/mL). A negative result must be combined with clinical observations, patient history, and epidemiological information. The expected result is Negative. Fact Sheet for Patients:  https://www.moore.com/ Fact Sheet for Healthcare Providers:  https://www.young.biz/ This test is not yet ap proved or cleared by the [place] FDA and  has been authorized for detection and/or diagnosis of SARS-CoV-2 by FDA under an Emergency Use Authorization (EUA). This EUA will remain  in effect (meaning this test can be used) for the duration of the COVID-19 declaration under Section 564(b)(1) of the Act, 21 U.S.C. section 360bbb-3(b)(1), unless the authorization is terminated or revoked sooner.    Influenza A by PCR NEGATIVE NEGATIVE Final   Influenza B by PCR NEGATIVE NEGATIVE Final    Comment: (NOTE) The Xpert Xpress SARS-CoV-2/FLU/RSV assay is intended as an aid in  the diagnosis of influenza from Nasopharyngeal swab specimens and  should not be used as a sole basis for treatment. Nasal washings and  aspirates are unacceptable for Xpert Xpress SARS-CoV-2/FLU/RSV  testing. Fact Sheet for Patients: https://www.moore.com/ Fact Sheet for Healthcare Providers: https://www.young.biz/ This test is not yet approved or cleared by the [place] FDA and  has been authorized for detection and/or diagnosis of SARS-CoV-2 by  FDA under an  Emergency Use Authorization (EUA). This EUA will remain  in effect (meaning this test can be used) for the duration of the  Covid-19 declaration under Section 564(b)(1) of the Act, 21  U.S.C. section 360bbb-3(b)(1), unless the authorization is  terminated or revoked. Performed at [facility], [address], [place], [place] [address]   MRSA PCR Screening     Status:  Abnormal   Collection Time: 12/28/19  3:53 PM   Specimen: Nasal Mucosa; Nasopharyngeal  Result Value Ref Range Status   MRSA by PCR POSITIVE (A) NEGATIVE Final    Comment:        The GeneXpert MRSA Assay (FDA approved for NASAL specimens only), is one component of a comprehensive MRSA colonization surveillance program. It is not intended to diagnose MRSA infection nor to guide or monitor treatment for MRSA infections. RESULT CALLED TO, READ BACK BY AND VERIFIED WITH: FOLEY @ 0940 ON [id number] BY HENDERSON L. Performed at [facility], [address] [place], [place] [id number]          Radiology Studies: ECHOCARDIOGRAM COMPLETE  Result Date: 12/28/2019   ECHOCARDIOGRAM REPORT   Patient Name:   Mary HakimMICHELLE L [name] Date of Exam: 12/28/2019 Medical Rec #:  [id number]         Height:       62.5 in Accession #:    [id number]        Weight:       220.0 lb Date of Birth:  02/28/1976         BSA:          2.00 m Patient Age:    43 years          BP:           127/71 mmHg Patient Gender: F                 HR:           93 bpm. [facility] Procedure: 2D Echo Indications:    Pulmonary hypertension 416.8 / I27.2                 Abnormal ECG 794.31 / R94.31  History:        Patient has prior history of Echocardiogram examinations, most                 recent 02/01/2017. COPD; Risk Factors:Former Smoker and Diabetes.                 ETOH, Pulmonary mass, Influenza A, Congenital heart disease,                 PHTN.  Sonographer:    Jeryl ColumbiaJohanna Elliott RDCS (AE) Referring Phys: [id number] DAVID MANUEL ORTIZ IMPRESSIONS  1. Left  ventricular ejection fraction, by visual estimation, is 60 to 65%. The left ventricle has normal function. There is mildly increased left ventricular hypertrophy. Suboptimal study with poor endocardial and valvular visualization.  2. Global right ventricle has normal systolic function.The right ventricular size is mildly enlarged. Right vetricular wall thickness was not assessed.  3. Left atrial size was normal.  4. Right atrial size was mildly dilated.  5. The mitral valve is grossly normal. No evidence of mitral valve regurgitation.  6. The tricuspid valve is not well visualized.  7. The tricuspid valve is not well visualized. Tricuspid valve regurgitation is trivial.  8. The aortic valve was not well visualized. Aortic valve regurgitation is not visualized.  9. The pulmonic valve was not well visualized. Pulmonic valve regurgitation is not visualized. 10. The inferior vena cava is normal in size with greater than 50% respiratory variability, suggesting right atrial pressure of 3 mmHg. 11. The interatrial septum was not well visualized. FINDINGS  Left Ventricle: Left ventricular ejection fraction, by visual estimation, is 60 to 65%. The left ventricle has normal function. The left ventricle is  not well visualized. The left ventricular internal cavity size was the left ventricle is normal in size. There is mildly increased left ventricular hypertrophy. Concentric left ventricular hypertrophy. Left ventricular diastolic parameters were normal. Right Ventricle: The right ventricular size is mildly enlarged. Right vetricular wall thickness was not assessed. Global RV systolic function is has normal systolic function. Left Atrium: Left atrial size was normal in size. Right Atrium: Right atrial size was mildly dilated Pericardium: There is no evidence of pericardial effusion. Mitral Valve: The mitral valve is grossly normal. No evidence of mitral valve regurgitation. Tricuspid Valve: The tricuspid valve is not well  visualized. Tricuspid valve regurgitation is trivial. Aortic Valve: The aortic valve was not well visualized. Aortic valve regurgitation is not visualized. Pulmonic Valve: The pulmonic valve was not well visualized. Pulmonic valve regurgitation is not visualized. Pulmonic regurgitation is not visualized. Aorta: The aortic root is normal in size and structure. Venous: The inferior vena cava was not well visualized. The inferior vena cava is normal in size with greater than 50% respiratory variability, suggesting right atrial pressure of 3 mmHg. IAS/Shunts: The interatrial septum was not well visualized.  LEFT VENTRICLE PLAX 2D LVIDd:         5.01 cm  Diastology LVIDs:         2.52 cm  LV e' lateral:   8.27 cm/s LV PW:         1.38 cm  LV E/e' lateral: 13.9 LV IVS:        1.00 cm  LV e' medial:    8.27 cm/s LVOT diam:     2.50 cm  LV E/e' medial:  13.9 LV SV:         96 ml LV SV Index:   44.66 LVOT Area:     4.91 cm  RIGHT VENTRICLE RV S prime:     15.50 cm/s TAPSE (M-mode): 2.3 cm LEFT ATRIUM             Index       RIGHT ATRIUM           Index LA diam:        4.30 cm 2.15 cm/m  RA Area:     18.90 cm LA Vol (A2C):   60.0 ml 29.96 ml/m RA Volume:   51.20 ml  25.57 ml/m LA Vol (A4C):   54.4 ml 27.16 ml/m LA Biplane Vol: 57.2 ml 28.56 ml/m   AORTA Ao Root diam: 3.50 cm MITRAL VALVE MV Area (PHT): 2.83 cm              SHUNTS MV PHT:        77.72 msec            Systemic Diam: 2.50 cm MV Decel Time: 268 msec MV E velocity: 115.00 cm/s 103 cm/s MV A velocity: 116.00 cm/s 70.3 cm/s MV E/A ratio:  0.99        1.5  Kate Sable MD Electronically signed by Kate Sable MD Signature Date/Time: 12/28/2019/11:34:29 AM    Final         Scheduled Meds: . azithromycin  250 mg Oral Daily  . budesonide (PULMICORT) nebulizer solution  0.25 mg Nebulization BID  . Chlorhexidine Gluconate Cloth  6 each Topical Daily  . Chlorhexidine Gluconate Cloth  6 each Topical Q0600  . feeding supplement (GLUCERNA SHAKE)   237 mL Oral BID BM  . gabapentin  600 mg Oral TID  . guaiFENesin  600 mg Oral BID  .  insulin aspart  0-20 Units Subcutaneous TID WC  . insulin aspart  0-5 Units Subcutaneous QHS  . insulin aspart  20 Units Subcutaneous TID WC  . insulin glargine  10 Units Subcutaneous NOW  . insulin glargine  40 Units Subcutaneous QHS  . ipratropium-albuterol  3 mL Nebulization Q6H  . multivitamin with minerals  1 tablet Oral Daily  . mupirocin ointment  1 application Nasal BID  . pantoprazole  40 mg Oral Daily  . potassium chloride  10 mEq Oral Q48H  . [START ON 12/31/2019] predniSONE  40 mg Oral Q breakfast  . sodium chloride flush  3 mL Intravenous Q12H  . torsemide  20 mg Oral Daily   Continuous Infusions:   LOS: 2 days    Time spent:    Erick Blinks, MD [facility]   If 7PM-7AM, please contact night-coverage [email or web address]  12/30/2019, 10:39 AM

## 2019-12-31 LAB — BLOOD GAS, ARTERIAL
Acid-Base Excess: 17.4 mmol/L — ABNORMAL HIGH (ref 0.0–2.0)
Bicarbonate: 39.3 mmol/L — ABNORMAL HIGH (ref 20.0–28.0)
FIO2: 28
O2 Saturation: 96.2 %
Patient temperature: 37
pCO2 arterial: 68.1 mmHg (ref 32.0–48.0)
pH, Arterial: 7.417 (ref 7.350–7.450)
pO2, Arterial: 82 mmHg — ABNORMAL LOW (ref 83.0–108.0)

## 2019-12-31 LAB — GLUCOSE, CAPILLARY
Glucose-Capillary: 515 mg/dL (ref 70–99)
Glucose-Capillary: 165 mg/dL — ABNORMAL HIGH (ref 70–99)

## 2019-12-31 LAB — BASIC METABOLIC PANEL
Anion gap: 12 (ref 5–15)
BUN: 15 mg/dL (ref 6–20)
CO2: 39 mmol/L — ABNORMAL HIGH (ref 22–32)
Calcium: 9 mg/dL (ref 8.9–10.3)
Chloride: 83 mmol/L — ABNORMAL LOW (ref 98–111)
Creatinine, Ser: 0.52 mg/dL (ref 0.44–1.00)
GFR calc Af Amer: >60 mL/min (ref >60)
GFR calc non Af Amer: >60 mL/min (ref >60)
Glucose, Bld: 178 mg/dL — ABNORMAL HIGH (ref 70–99)
Potassium: 3.8 mmol/L (ref 3.5–5.1)
Sodium: 134 mmol/L — ABNORMAL LOW (ref 135–145)

## 2019-12-31 MED ORDER — IPRATROPIUM-ALBUTEROL 0.5-2.5 (3) MG/3ML IN SOLN
3.0000 mL | Freq: Four times a day (QID) | RESPIRATORY_TRACT | Status: DC
  Filled 2019-12-31: qty 3

## 2019-12-31 MED ORDER — PREDNISONE 20 MG PO TABS: 40.0000 mg | ORAL_TABLET | Freq: Every day | ORAL | 0 refills | Status: AC

## 2019-12-31 MED ORDER — ALBUTEROL SULFATE 0.63 MG/3ML IN NEBU: 1.0000 | INHALATION_SOLUTION | Freq: Four times a day (QID) | RESPIRATORY_TRACT | 12 refills | Status: AC | PRN

## 2019-12-31 NOTE — Progress Notes (Signed)
PT Cancellation Note  Patient Details Name: AIYONNA LUCADO MRN: [id number] DOB: 1976-06-28   Cancelled Treatment:    Reason Eval/Treat Not Completed: PT screened, no needs identified, will sign off   9:59 AM, 12/31/19 Ocie Bob, MPT Physical Therapist with [facility] 336 [phone] office [phone] mobile phone

## 2019-12-31 NOTE — Discharge Instructions (Signed)
1) take prednisone as prescribed 2)Avoid ibuprofen/Advil/Aleve/Motrin/Goody Powders/Naproxen/BC powders/Meloxicam/Diclofenac/Indomethacin and other Nonsteroidal anti-inflammatory medications as these will make you more likely to bleed and can cause stomach ulcers, can also cause Kidney problems.  3) use BiPAP as advised every time you fall asleep 4) follow-up with PCP and pulmonologist for outpatient repeat sleep study for possible titration of your BiPAP pressures

## 2019-12-31 NOTE — Progress Notes (Signed)
OT Screen Note  Patient Details Name: Mary [name] MRN: [id number] DOB: 01/09/1976   Cancelled Treatment:    Reason Eval/Treat Not Completed: OT screened, no needs identified, will sign off. Patient admitted for exacerbation of COPD symptoms and OT orders were placed. Spoke with patient regarding functional performance during ADL tasks. Patient reports receiving home health OT and PT previously for her COPD and it helped her tremendously. Patient reports that she doesn't have any insurance at the moment and will not be able to receive any therapy services at home. Patient is currently functioning at independent to modified independence level and does not require any DME or assistance to complete basic ADL tasks. No follow up OT services are recommended at this time. Will sign off.       Limmie Patricia, OTR/L,CBIS  [phone]  12/31/2019, 9:16 AM

## 2019-12-31 NOTE — Discharge Summary (Signed)
Mary [name], is a 44 y.o. female  DOB 1976-04-13  MRN [id number].  Admission date:  12/28/2019  Admitting Physician  Bobette Mo, MD  Discharge Date:  12/31/2019   Primary MD  Corwin Levins, MD  Recommendations for primary care physician for things to follow:   1) take prednisone as prescribed 2)Avoid ibuprofen/Advil/Aleve/Motrin/Goody Powders/Naproxen/BC powders/Meloxicam/Diclofenac/Indomethacin and other Nonsteroidal anti-inflammatory medications as these will make you more likely to bleed and can cause stomach ulcers, can also cause Kidney problems.  3) use BiPAP as advised every time you fall asleep 4) follow-up with PCP and pulmonologist for outpatient repeat sleep study for possible titration of your BiPAP pressures   Admission Diagnosis  Hypoxia [R09.02] COPD exacerbation (HCC) [J44.1] Acute on chronic respiratory failure with hypoxia and hypercapnia (HCC) [J96.21, J96.22]   Discharge Diagnosis  Hypoxia [R09.02] COPD exacerbation (HCC) [J44.1] Acute on chronic respiratory failure with hypoxia and hypercapnia (HCC) [J96.21, J96.22]    Principal Problem:   Acute on chronic respiratory failure with hypoxia and hypercapnia (HCC) Active Problems:   Tobacco use   OSA on BiPAP   Pulmonary hypertension (HCC)   Diabetes mellitus type 2 in obese (HCC)   Diabetic neuropathy (HCC)   Thrombocytopenia (HCC)   COPD with acute exacerbation (HCC)   Abnormal LFTs (liver function tests)      Past Medical History:  Diagnosis Date  . Asthma   . Axillary adenopathy    right axillary adenopathy noted on CT chest (03/06/2012)  . Chronic right-sided CHF (congestive heart failure) (HCC) 03/23/2012   with cor pulmonale. Last RHC 03/2012  . COPD (chronic obstructive pulmonary disease) (HCC)    PFTS 03/08/12: fev1 0.58L/1%, FVC 1.18/33%, Ratop 49 and c/w ssevere obstruction. 21% BD response on FVC, RV  219%, DLCO 11/54%  . Diabetes mellitus without complication (HCC)   . DVT of upper extremity (deep vein thrombosis) [facility]) April 2013   right subclavian // Unclear precipitating cause - possibly significant right heart failure, with vascular stasis potentially predisposing to clotting.    . Exertional shortness of breath   . Hepatic cirrhosis (HCC)    Questionable history of - Noted on CT abdomen (03/2012) - thought to be due to vascular congestion from right heart failure +/- patient's history of alcohol abuse  . History of alcoholism (HCC)   . History of chronic bronchitis   . Irregular menses   . Migraine    "~ 1/yr" (05/03/2013)  . Moderate to severe pulmonary hypertension [facility]) April 2013   Cardiac cath on 03/06/12 - 1. Elevated pulmonary artery pressures, right sided filling pressures.,  2. PA: 64/45 (mean 53)    . On home oxygen therapy    "2-3 L 24/7" (05/03/2013)  . OSA (obstructive sleep apnea)    wears noctural BiPAP (05/03/2013)  . Periodontal disease   . Pneumonia ~ 1985; 01/2011  . Pulmonary embolus [facility]) April 2013   Precipitating cause unclear. Was treated with coumadin from April-June 2013.  . Pulmonary hypertension (HCC)  Past Surgical History:  Procedure Laterality Date  . APPENDECTOMY  05/03/2013  . CARDIAC CATHETERIZATION  03/2012  . CARDIAC CATHETERIZATION N/A 01/08/2016   Procedure: Right Heart Cath;  Surgeon: Jolaine Artist, MD;  Location: [place] [facility];  Service: Cardiovascular;  Laterality: N/A;  . CARDIAC SURGERY  05/24/1977   "my heart was backwards" (05/03/2013)  . LAPAROSCOPIC APPENDECTOMY N/A 05/03/2013   Procedure: APPENDECTOMY LAPAROSCOPIC;  Surgeon: Gwenyth Ober, MD;  Location: [place];  Service: General;  Laterality: N/A;  . LUNG SURGERY  Nov 08, 1977  . RIGHT HEART CATHETERIZATION N/A 03/07/2012   Procedure: RIGHT HEART CATH;  Surgeon: Burnell Blanks, MD;  Location: [facility] CATH LAB;  Service: Cardiovascular;  Laterality: N/A;       HPI  from  the history and physical done on the day of admission:    Chief Complaint: Shortness of breath.  HPI: Mary [name] is a 44 y.o. female with medical history significant of asthma, axillary adenopathy, moderate pulmonary hypertension, chronic right-sided CHF with cor pulmonale, COPD, type 2 diabetes, DVT, hepatic cirrhosis, history of alcoholism, irregular menstrual period, migraine headaches, OSA dome BiPAP at bedtime, periodontal disease, history of pneumonia, history of PE in 2013 who is coming to the emergency department due to worsening baseline dyspnea for 3 to 4 days.  She denies fever, chills, sore throat, rhinorrhea.  She denies travel history or sick contacts.  No chest pain, dizziness, diaphoresis, pitting edema lower extremities, but complains of orthopnea.  She has frequent constipation, but denies abdominal pain, diarrhea, nausea or vomiting, melena or hematochezia.  No dysuria, frequency or hematuria.  She denies polyuria, polydipsia, polyphagia or blurred vision.  ED Course: Initial vital signs temperature 98.7 F, pulse 112, respirations 23, blood pressure 139/79 mmHg and O2 sat 98% on room air.  The patient was given 125 mg of Solu-Medrol by EMS.  In the ED, she continued to received supplemental oxygen, had albuterol MDI twice and this was followed by a nebulizer treatment.  She was placed on BiPAP ventilation.  Venous blood gas done which shows normal pH but significantly increased pCO2 84.8 and pO2 of 129.0 mmHg while on 36% oxygen.  Her bicarbonate was 34.5 and acid base excess was 13.6 mmol/L.  Her O2 sat was 99.3%.Urine pregnancy test was negative.  CBC showed a white count 7.7, hemoglobin 13.2 g/dL and platelets 111.  Her MCV was elevated 105.4.  Fibrinogen was 342 and D-dimer was 7.27.  Lactic acid was 2.6 and repeat lactic acid 1.2 mmol/L.  BNP was 117.0 pg/mL.  Sodium is 134, potassium 3.5, chloride 86 and CO2 37 mmol/L.  Glucose 260, BUN 6, creatinine 0.51 and calcium 8.6  mg/dL.  Total protein 8.2, albumin 3.5 g/dL.  AST 55, ALT 24 and alkaline phosphatase 131 units/L.  Total bilirubin is 1.5 mg/dL.  The patient liver functions are improved when compared to previous.  SARS 2, influenza a and influenza B by PCR was negative.  Procalcitonin was less than 0.10 ng/mL.  CRP was 2.3 and ferritin was 31.  Imaging:  Chest radiograph did not show any acute findings.  CT did not show any acute intrathoracic abnormality or acute PE.  There is chronic dilatation of the main pulmonary artery, heterogenous pulmonary parenchyma with areas showing architectural distortion and scaring which is unchanged from previous scan.  There is hepatosplenomegaly and hepatic asteatosis.  There is a stable groundglass nodule and right upper lobe.  CT follow-up suggested given the patient risk.  Please see images and full radiology report for further detail.    Hospital Course:   Brief Narrative:  44 year old female with a history of pulmonary hypertension, right-sided heart failure, COPD, diabetes, obstructive sleep apnea on BiPAP at bedtime, presents to the hospital with progressive shortness of breath.  She is found to have COPD exacerbation and associated acute on chronic respiratory failure.  She was noted to have hypercapnia with elevated PCO2 above baseline.  She was put on continuous BiPAP and admitted for further treatments.   Assessment & Plan:   Principal Problem:   Acute on chronic respiratory failure with hypoxia and hypercapnia (HCC) Active Problems:   Tobacco use   OSA on BiPAP   Pulmonary hypertension (HCC)   Diabetes mellitus type 2 in obese (HCC)   Diabetic neuropathy (HCC)   Thrombocytopenia (HCC)   COPD with acute exacerbation (HCC)   Abnormal LFTs (liver function tests)   1. Acute on chronic respiratory failure with hypoxia and hypercapnia.  Secondary to COPD exacerbation.  Patient initially required continuous BiPAP,  -Overall much improved, CPAP nightly at  home advised    2. COPD exacerbation--no further wheezing, no dyspnea at rest, improved significantly with Solu-Medrol, okay to discharge on prednisone along with bronchodilators, mucolytics and antibiotics. .   3. Obstructive sleep apnea ----CPAP nightly advised    4. Pulmonary hypertension.  Echocardiogram shows normal ejection fraction.  Likely secondary to severe COPD and obstructive sleep apnea-   5. Diabetic neuropathy.  Continue gabapentin   6)Diabetes.  A1c of 6.9--- Blood sugars are elevated in setting of steroids.  adjusted insulin regimen  7)Thrombocytopenia. Due to hepatosplenomegaly.     Code Status: Full code  Disposition --- Home in stable condition  Follow UP--outpatient follow-up for CPAP titration  Diet and Activity recommendation:  As advised  Discharge Instructions    Discharge Instructions    Call MD for:  difficulty breathing, headache or visual disturbances   Complete by: As directed    Call MD for:  extreme fatigue   Complete by: As directed    Call MD for:  persistant dizziness or light-headedness   Complete by: As directed    Call MD for:  persistant nausea and vomiting   Complete by: As directed    Call MD for:  severe uncontrolled pain   Complete by: As directed    Call MD for:  temperature >100.4   Complete by: As directed    Diet - low sodium heart healthy   Complete by: As directed    Diet Carb Modified   Complete by: As directed    Discharge instructions   Complete by: As directed    1) take prednisone as prescribed 2)Avoid ibuprofen/Advil/Aleve/Motrin/Goody Powders/Naproxen/BC powders/Meloxicam/Diclofenac/Indomethacin and other Nonsteroidal anti-inflammatory medications as these will make you more likely to bleed and can cause stomach ulcers, can also cause Kidney problems.  3) use BiPAP as advised every time you fall asleep 4) follow-up with PCP and pulmonologist for outpatient repeat sleep study for possible titration of your  BiPAP pressures   Increase activity slowly   Complete by: As directed         Discharge Medications     Allergies as of 12/31/2019      Reactions   Bee Venom Swelling   Pt states she swells up 3x normal size. If stung "2-3 times, my throat could close up"      Medication List    STOP taking these medications   nitrofurantoin (macrocrystal-monohydrate) 100  MG capsule Commonly known as: Macrobid     TAKE these medications   albuterol 108 (90 Base) MCG/ACT inhaler Commonly known as: VENTOLIN HFA USE 2 PUFFS EVERY 6 HOURS AS NEEDED FOR WHEEZING What changed:   how much to take  how to take this  when to take this  reasons to take this   albuterol 0.63 MG/3ML nebulizer solution Commonly known as: ACCUNEB Take 3 mLs (0.63 mg total) by nebulization every 6 (six) hours as needed for wheezing or shortness of breath. What changed: reasons to take this   Basaglar KwikPen 100 UNIT/ML Sopn Inject 0.25 mLs (25 Units total) into the skin daily at 10 pm. E11.9  Restart after finished with 70/30 insulin   budesonide-formoterol 160-4.5 MCG/ACT inhaler Commonly known as: Symbicort INHALE 2 PUFFS BY MOUTH INTO THE LUNGS DAILY What changed:   how much to take  how to take this  when to take this   gabapentin 300 MG capsule Commonly known as: NEURONTIN Take 2 capsules (600 mg total) by mouth 3 (three) times daily.   glucose blood test strip Commonly known as: ONE TOUCH ULTRA TEST Use as instructed four times per day E11.9   HumaLOG KwikPen 100 UNIT/ML KwikPen Generic drug: insulin lispro Inject 0.15 mLs (15 Units total) into the skin 3 (three) times daily with meals. Restart after finished with 70/30 insulin   Insulin Pen Needle 32G X 4 MM Misc Commonly known as: BD Pen Needle Nano U/F Use as directed four times per day daily E11.9   ipratropium-albuterol 0.5-2.5 (3) MG/3ML Soln Commonly known as: DUONEB TAKE 3 MLS BY NEBULIZATION EVERY 4 (FOUR) HOURS AS NEEDED.     MUCINEX PO Take 1 tablet by mouth daily as needed.   pantoprazole 40 MG tablet Commonly known as: PROTONIX Take 1 tablet (40 mg total) by mouth 2 (two) times daily before a meal. What changed: when to take this   polyethylene glycol 17 g packet Commonly known as: MIRALAX / GLYCOLAX Take 17 g by mouth daily as needed for mild constipation.   potassium chloride 10 MEQ tablet Commonly known as: Klor-Con 10 Take 1 tablet (10 mEq total) by mouth every other day.   predniSONE 20 MG tablet Commonly known as: Deltasone Take 2 tablets (40 mg total) by mouth daily with breakfast. What changed: how much to take   pregabalin 50 MG capsule Commonly known as: Lyrica Take 1 capsule (50 mg total) by mouth 2 (two) times daily.   Spiriva Respimat 1.25 MCG/ACT Aers Generic drug: Tiotropium Bromide Monohydrate Inhale 2 puffs into the lungs daily.   torsemide 20 MG tablet Commonly known as: DEMADEX TAKE 1 TABLET BY MOUTH EVERY DAY   traMADol 50 MG tablet Commonly known as: ULTRAM TAKE 1 TABLET BY MOUTH EVERY 6 HOURS AS NEEDED What changed: when to take this       Major procedures and Radiology Reports - PLEASE review detailed and final reports for all details, in brief -    CT Angio Chest PE W/Cm &/Or Wo Cm  Result Date: 12/28/2019 CLINICAL DATA:  Shortness of breath. Elevated D-dimer. Cough. History of pulmonary embolus. EXAM: CT ANGIOGRAPHY CHEST WITH CONTRAST TECHNIQUE: Multidetector CT imaging of the chest was performed using the standard protocol during bolus administration of intravenous contrast. Multiplanar CT image reconstructions and MIPs were obtained to evaluate the vascular anatomy. CONTRAST:  100mL OMNIPAQUE IOHEXOL 350 MG/ML SOLN COMPARISON:  Radiograph earlier this day. Most recent chest CT 02/08/2018 FINDINGS: Cardiovascular: Right  upper extremity injection. Dense contrast refluxes into right chest wall collaterals, also seen on prior exam. No evidence of SVC occlusion.  Patient has history of right subclavian DVT. Chronic dilatation of the main pulmonary artery at 4.1 cm. There are no filling defects within the pulmonary arteries to suggest acute pulmonary embolus. Evaluation is diagnostic to the proximal segmental level. Mild cardiomegaly. Slight leftward heart deviation unchanged. No pericardial effusion. No aortic dissection. Mediastinum/Nodes: Multiple small mediastinal lymph nodes, not enlarged by size criteria. Multiple small mediastinal collaterals. No esophageal wall thickening. Thyroid gland not visualized. Lungs/Pleura: Heterogeneous pulmonary parenchyma with areas of architectural distortion. Scattered areas of pulmonary scarring. No acute airspace disease or pleural effusion. No pulmonary edema. Mild distal bronchial wall thickening. No pulmonary mass. Stable area of ground-glass in the posterior segment of the right upper lobe dating back to 2018 and considered benign. Upper Abdomen: Partially included hepatosplenomegaly and hepatic steatosis. Musculoskeletal: There are no acute or suspicious osseous abnormalities. Review of the MIP images confirms the above findings. IMPRESSION: 1. No acute pulmonary embolus.  No acute intrathoracic abnormality. 2. Chronic dilatation of the main pulmonary artery, suggesting pulmonary arterial hypertension. 3. Heterogeneous pulmonary parenchyma with areas of architectural distortion in pulmonary scarring, not significantly changed from 2018 high-resolution chest CT. 4. Partially included hepatosplenomegaly and hepatic steatosis. 5. Stable ground-glass nodule in the right upper lobe dating back to 2018 consistent with benign etiology. No further follow-up is needed if patient is low risk. Non-contrast chest CT can be considered in 12 months. This recommendation follows the consensus statement: Guidelines for Management of Incidental Pulmonary Nodules Detected on CT Images: From the Fleischner Society 2017; Radiology 2017; 284:228-243.  6. Stable right anterior chest wall collaterals with reflux of IV contrast. Stable small mediastinal collaterals. This is unchanged from 02/08/2018 chest CTA. Electronically Signed   By: Narda Rutherford M.D.   On: 12/28/2019 04:30   DG Chest Port 1 View  Result Date: 12/28/2019 CLINICAL DATA:  Shortness of breath EXAM: PORTABLE CHEST 1 VIEW COMPARISON:  12/28/2018 FINDINGS: The heart size and mediastinal contours are within normal limits. Both lungs are clear. The visualized skeletal structures are unremarkable. IMPRESSION: No active disease. Electronically Signed   By: Deatra Robinson M.D.   On: 12/28/2019 03:01   ECHOCARDIOGRAM COMPLETE  Result Date: 12/28/2019   ECHOCARDIOGRAM REPORT   Patient Name:   Mary [name] Date of Exam: 12/28/2019 Medical Rec #:  [id number]         Height:       62.5 in Accession #:    [id number]        Weight:       220.0 lb Date of Birth:  1976-12-03         BSA:          2.00 m Patient Age:    43 years          BP:           127/71 mmHg Patient Gender: F                 HR:           93 bpm. [facility] Procedure: 2D Echo Indications:    Pulmonary hypertension 416.8 / I27.2                 Abnormal ECG 794.31 / R94.31  History:        Patient has prior history of Echocardiogram examinations, most  recent 02/01/2017. COPD; Risk Factors:Former Smoker and Diabetes.                 ETOH, Pulmonary mass, Influenza A, Congenital heart disease,                 PHTN.  Sonographer:    Jeryl ColumbiaJohanna Elliott RDCS (AE) Referring Phys: [id number] DAVID MANUEL ORTIZ IMPRESSIONS  1. Left ventricular ejection fraction, by visual estimation, is 60 to 65%. The left ventricle has normal function. There is mildly increased left ventricular hypertrophy. Suboptimal study with poor endocardial and valvular visualization.  2. Global right ventricle has normal systolic function.The right ventricular size is mildly enlarged. Right vetricular wall thickness was not assessed.  3.  Left atrial size was normal.  4. Right atrial size was mildly dilated.  5. The mitral valve is grossly normal. No evidence of mitral valve regurgitation.  6. The tricuspid valve is not well visualized.  7. The tricuspid valve is not well visualized. Tricuspid valve regurgitation is trivial.  8. The aortic valve was not well visualized. Aortic valve regurgitation is not visualized.  9. The pulmonic valve was not well visualized. Pulmonic valve regurgitation is not visualized. 10. The inferior vena cava is normal in size with greater than 50% respiratory variability, suggesting right atrial pressure of 3 mmHg. 11. The interatrial septum was not well visualized. FINDINGS  Left Ventricle: Left ventricular ejection fraction, by visual estimation, is 60 to 65%. The left ventricle has normal function. The left ventricle is not well visualized. The left ventricular internal cavity size was the left ventricle is normal in size. There is mildly increased left ventricular hypertrophy. Concentric left ventricular hypertrophy. Left ventricular diastolic parameters were normal. Right Ventricle: The right ventricular size is mildly enlarged. Right vetricular wall thickness was not assessed. Global RV systolic function is has normal systolic function. Left Atrium: Left atrial size was normal in size. Right Atrium: Right atrial size was mildly dilated Pericardium: There is no evidence of pericardial effusion. Mitral Valve: The mitral valve is grossly normal. No evidence of mitral valve regurgitation. Tricuspid Valve: The tricuspid valve is not well visualized. Tricuspid valve regurgitation is trivial. Aortic Valve: The aortic valve was not well visualized. Aortic valve regurgitation is not visualized. Pulmonic Valve: The pulmonic valve was not well visualized. Pulmonic valve regurgitation is not visualized. Pulmonic regurgitation is not visualized. Aorta: The aortic root is normal in size and structure. Venous: The inferior vena  cava was not well visualized. The inferior vena cava is normal in size with greater than 50% respiratory variability, suggesting right atrial pressure of 3 mmHg. IAS/Shunts: The interatrial septum was not well visualized.  LEFT VENTRICLE PLAX 2D LVIDd:         5.01 cm  Diastology LVIDs:         2.52 cm  LV e' lateral:   8.27 cm/s LV PW:         1.38 cm  LV E/e' lateral: 13.9 LV IVS:        1.00 cm  LV e' medial:    8.27 cm/s LVOT diam:     2.50 cm  LV E/e' medial:  13.9 LV SV:         96 ml LV SV Index:   44.66 LVOT Area:     4.91 cm  RIGHT VENTRICLE RV S prime:     15.50 cm/s TAPSE (M-mode): 2.3 cm LEFT ATRIUM             Index  RIGHT ATRIUM           Index LA diam:        4.30 cm 2.15 cm/m  RA Area:     18.90 cm LA Vol (A2C):   60.0 ml 29.96 ml/m RA Volume:   51.20 ml  25.57 ml/m LA Vol (A4C):   54.4 ml 27.16 ml/m LA Biplane Vol: 57.2 ml 28.56 ml/m   AORTA Ao Root diam: 3.50 cm MITRAL VALVE MV Area (PHT): 2.83 cm              SHUNTS MV PHT:        77.72 msec            Systemic Diam: 2.50 cm MV Decel Time: 268 msec MV E velocity: 115.00 cm/s 103 cm/s MV A velocity: 116.00 cm/s 70.3 cm/s MV E/A ratio:  0.99        1.5  Prentice Docker MD Electronically signed by Prentice Docker MD Signature Date/Time: 12/28/2019/11:34:29 AM    Final     Micro Results   Recent Results (from the past 240 hour(s))  Blood Culture (routine x 2)     Status: None (Preliminary result)   Collection Time: 12/28/19  2:18 AM   Specimen: BLOOD RIGHT HAND  Result Value Ref Range Status   Specimen Description BLOOD RIGHT HAND  Final   Special Requests   Final    BOTTLES DRAWN AEROBIC AND ANAEROBIC Blood Culture adequate volume   Culture   Final    NO GROWTH 3 DAYS Performed at [facility], [address], [place], [place] [address]    Report Status PENDING  Incomplete  Blood Culture (routine x 2)     Status: None (Preliminary result)   Collection Time: 12/28/19  2:23 AM   Specimen: BLOOD RIGHT FOREARM  Result  Value Ref Range Status   Specimen Description BLOOD RIGHT FOREARM  Final   Special Requests   Final    BOTTLES DRAWN AEROBIC AND ANAEROBIC Blood Culture adequate volume   Culture   Final    NO GROWTH 3 DAYS Performed at [facility], [address], [place], [place] [address]    Report Status PENDING  Incomplete  Respiratory Panel by RT PCR (Flu A&B, Covid) - Nasopharyngeal Swab     Status: None   Collection Time: 12/28/19  2:29 AM   Specimen: Nasopharyngeal Swab  Result Value Ref Range Status   SARS Coronavirus 2 by RT PCR NEGATIVE NEGATIVE Final    Comment: (NOTE) SARS-CoV-2 target nucleic acids are NOT DETECTED. The SARS-CoV-2 RNA is generally detectable in upper respiratoy specimens during the acute phase of infection. The lowest concentration of SARS-CoV-2 viral copies this assay can detect is 131 copies/mL. A negative result does not preclude SARS-Cov-2 infection and should not be used as the sole basis for treatment or other patient management decisions. A negative result may occur with  improper specimen collection/handling, submission of specimen other than nasopharyngeal swab, presence of viral mutation(s) within the areas targeted by this assay, and inadequate number of viral copies (<131 copies/mL). A negative result must be combined with clinical observations, patient history, and epidemiological information. The expected result is Negative. Fact Sheet for Patients:  https://www.moore.com/ Fact Sheet for Healthcare Providers:  https://www.young.biz/ This test is not yet ap proved or cleared by the [place] FDA and  has been authorized for detection and/or diagnosis of SARS-CoV-2 by FDA under an Emergency Use Authorization (EUA). This EUA will remain  in effect (  meaning this test can be used) for the duration of the COVID-19 declaration under Section 564(b)(1) of the Act, 21 U.S.C. section 360bbb-3(b)(1), unless the  authorization is terminated or revoked sooner.    Influenza A by PCR NEGATIVE NEGATIVE Final   Influenza B by PCR NEGATIVE NEGATIVE Final    Comment: (NOTE) The Xpert Xpress SARS-CoV-2/FLU/RSV assay is intended as an aid in  the diagnosis of influenza from Nasopharyngeal swab specimens and  should not be used as a sole basis for treatment. Nasal washings and  aspirates are unacceptable for Xpert Xpress SARS-CoV-2/FLU/RSV  testing. Fact Sheet for Patients: https://www.moore.com/ Fact Sheet for Healthcare Providers: https://www.young.biz/ This test is not yet approved or cleared by the [place] FDA and  has been authorized for detection and/or diagnosis of SARS-CoV-2 by  FDA under an Emergency Use Authorization (EUA). This EUA will remain  in effect (meaning this test can be used) for the duration of the  Covid-19 declaration under Section 564(b)(1) of the Act, 21  U.S.C. section 360bbb-3(b)(1), unless the authorization is  terminated or revoked. Performed at [facility], [address] [place], [place] [address]   MRSA PCR Screening     Status: Abnormal   Collection Time: 12/28/19  3:53 PM   Specimen: Nasal Mucosa; Nasopharyngeal  Result Value Ref Range Status   MRSA by PCR POSITIVE (A) NEGATIVE Final    Comment:        The GeneXpert MRSA Assay (FDA approved for NASAL specimens only), is one component of a comprehensive MRSA colonization surveillance program. It is not intended to diagnose MRSA infection nor to guide or monitor treatment for MRSA infections. RESULT CALLED TO, READ BACK BY AND VERIFIED WITH: FOLEY @ 0940 ON [id number] BY HENDERSON L. Performed at [facility], [address], [place], [place] [address]        Today   Subjective    Mary [name] today has no new complaints No fever  Or chills   No Nausea, Vomiting or Diarrhea       Patient has been seen and examined prior to discharge   Objective    Blood pressure 117/83, pulse 84, temperature 97.8 F (36.6 C), temperature source Oral, resp. rate 18, height 5' 2.5" (1.588 m), weight 98.9 kg, last menstrual period 12/09/2019, SpO2 92 %.   Intake/Output Summary (Last 24 hours) at 12/31/2019 0957 Last data filed at 12/31/2019 0118 Gross per 24 hour  Intake 480 ml  Output 800 ml  Net -320 ml    Exam Gen:- Awake Alert, no acute distress  HEENT:- [place].AT, No sclera icterus Neck-Supple Neck,No JVD,.  Lungs-  CTAB , good air movement bilaterally  CV- S1, S2 normal, regular Abd-  +ve B.Sounds, Abd Soft, No tenderness,    Extremity/Skin:- No  edema,   good pulses Psych-affect is appropriate, oriented x3 Neuro-no new focal deficits, no tremors    Data Review   CBC w Diff:  Lab Results  Component Value Date   WBC 7.4 12/29/2019   HGB 12.4 12/29/2019   HCT 40.1 12/29/2019   PLT 86 (L) 12/29/2019   LYMPHOPCT 7 12/29/2019   MONOPCT 5 12/29/2019   EOSPCT 0 12/29/2019   BASOPCT 0 12/29/2019    CMP:  Lab Results  Component Value Date   NA 134 (L) 12/31/2019   K 3.8 12/31/2019   CL 83 (L) 12/31/2019   CO2 39 (H) 12/31/2019   BUN 15 12/31/2019   CREATININE 0.52 12/31/2019   CREATININE 0.65 03/21/2014  PROT 8.1 12/29/2019   ALBUMIN 3.6 12/29/2019   BILITOT 1.9 (H) 12/29/2019   ALKPHOS 115 12/29/2019   AST 32 12/29/2019   ALT 23 12/29/2019  .   Total Discharge time is about 33 minutes  Shon Hale M.D on 12/31/2019 at 9:57 AM  Go to [email or web address] -  for contact info  [facility] - Office  [phone]

## 2019-12-31 NOTE — Progress Notes (Signed)
Nsg Discharge Note  Admit Date:  12/28/2019 Discharge date: 12/31/2019   Mary [name] to be D/C'd Home per MD order.  AVS completed.   Patient able to verbalize understanding.  Discharge Medication: Allergies as of 12/31/2019      Reactions   Bee Venom Swelling   Pt states she swells up 3x normal size. If stung "2-3 times, my throat could close up"      Medication List    STOP taking these medications   nitrofurantoin (macrocrystal-monohydrate) 100 MG capsule Commonly known as: Macrobid     TAKE these medications   albuterol 108 (90 Base) MCG/ACT inhaler Commonly known as: VENTOLIN HFA USE 2 PUFFS EVERY 6 HOURS AS NEEDED FOR WHEEZING What changed:   how much to take  how to take this  when to take this  reasons to take this   albuterol 0.63 MG/3ML nebulizer solution Commonly known as: ACCUNEB Take 3 mLs (0.63 mg total) by nebulization every 6 (six) hours as needed for wheezing or shortness of breath. What changed: reasons to take this   Basaglar KwikPen 100 UNIT/ML Sopn Inject 0.25 mLs (25 Units total) into the skin daily at 10 pm. E11.9  Restart after finished with 70/30 insulin   budesonide-formoterol 160-4.5 MCG/ACT inhaler Commonly known as: Symbicort INHALE 2 PUFFS BY MOUTH INTO THE LUNGS DAILY What changed:   how much to take  how to take this  when to take this   gabapentin 300 MG capsule Commonly known as: NEURONTIN Take 2 capsules (600 mg total) by mouth 3 (three) times daily.   glucose blood test strip Commonly known as: ONE TOUCH ULTRA TEST Use as instructed four times per day E11.9   HumaLOG KwikPen 100 UNIT/ML KwikPen Generic drug: insulin lispro Inject 0.15 mLs (15 Units total) into the skin 3 (three) times daily with meals. Restart after finished with 70/30 insulin   Insulin Pen Needle 32G X 4 MM Misc Commonly known as: BD Pen Needle Nano U/F Use as directed four times per day daily E11.9   ipratropium-albuterol 0.5-2.5 (3)  MG/3ML Soln Commonly known as: DUONEB TAKE 3 MLS BY NEBULIZATION EVERY 4 (FOUR) HOURS AS NEEDED.   MUCINEX PO Take 1 tablet by mouth daily as needed.   pantoprazole 40 MG tablet Commonly known as: PROTONIX Take 1 tablet (40 mg total) by mouth 2 (two) times daily before a meal. What changed: when to take this   polyethylene glycol 17 g packet Commonly known as: MIRALAX / GLYCOLAX Take 17 g by mouth daily as needed for mild constipation.   potassium chloride 10 MEQ tablet Commonly known as: Klor-Con 10 Take 1 tablet (10 mEq total) by mouth every other day.   predniSONE 20 MG tablet Commonly known as: Deltasone Take 2 tablets (40 mg total) by mouth daily with breakfast. What changed: how much to take   pregabalin 50 MG capsule Commonly known as: Lyrica Take 1 capsule (50 mg total) by mouth 2 (two) times daily.   Spiriva Respimat 1.25 MCG/ACT Aers Generic drug: Tiotropium Bromide Monohydrate Inhale 2 puffs into the lungs daily.   torsemide 20 MG tablet Commonly known as: DEMADEX TAKE 1 TABLET BY MOUTH EVERY DAY   traMADol 50 MG tablet Commonly known as: ULTRAM TAKE 1 TABLET BY MOUTH EVERY 6 HOURS AS NEEDED What changed: when to take this       Discharge Assessment: Vitals:   12/31/19 0523 12/31/19 0830  BP: 121/77 117/83  Pulse: 69 84  Resp: 18 18  Temp: 98 F (36.7 C) 97.8 F (36.6 C)  SpO2: 97% 92%   Skin clean, dry and intact without evidence of skin break down, no evidence of skin tears noted. IV catheter discontinued intact. Site without signs and symptoms of complications - no redness or edema noted at insertion site, patient denies c/o pain - only slight tenderness at site.  Dressing with slight pressure applied.  D/c Instructions-Education: Discharge instructions given to patient with verbalized understanding. D/c education completed with patient including follow up instructions, medication list, d/c activities limitations if indicated, with other d/c  instructions as indicated by MD - patient able to verbalize understanding, all questions fully answered. Patient instructed to return to ED, call 911, or call MD for any changes in condition.  Patient escorted via [place], and D/C home via private auto.  [name], RN 12/31/2019 10:22 AM

## 2019-12-31 NOTE — Progress Notes (Signed)
CRITICAL VALUE ALERT  Critical Value:  PCO2=68.1  Date & Time Notied:  12/31/2019 @ 0502  Provider Notified: Dr. Robb Matar notified [phone]  Orders Received/Actions taken: awaiting orders.

## 2020-01-01 ENCOUNTER — Telehealth: Payer: Self-pay | Admitting: *Deleted

## 2020-01-01 NOTE — Telephone Encounter (Signed)
Called pt concerning hosp d/c from 12/30/18. Per summary need f/u appt btw 7-14 days. Pt states she has no insurance right now. Her husband lost his job due to covid. She is waiting to hear back about her disability so at this point she is not able to come in. She states she will continue taking med that was rx yesterday, and using her inhaler. She states if her cough does not get better after completing the prednisone she will make appt to f/u w/her pulmonologist../lmb

## 2020-01-02 LAB — CULTURE, BLOOD (ROUTINE X 2)
Culture: NO GROWTH
Culture: NO GROWTH
Special Requests: ADEQUATE
Special Requests: ADEQUATE

## 2020-01-05 ENCOUNTER — Other Ambulatory Visit: Payer: Self-pay | Admitting: Adult Health

## 2020-01-25 ENCOUNTER — Telehealth: Payer: Self-pay | Admitting: Internal Medicine

## 2020-01-25 NOTE — Telephone Encounter (Signed)
Budd Palmer the pulmonary administrator called me this morning and said the funeral home was going to send me at that significant.  I received that that significant via fax.  I thought it was for this particular patient Mary [name] with date of birth [birth year] and date of death 02/07/2020 which is today.  I do not know any details.  There is no record of this patient's admission anywhere after January 2021.  I do know that she suffered from gold stage IV COPD and chronic respiratory failure.  Therefore I will put the cause of death as COPD  I do not know if she had COVID-19  Please update the patient's epic that she is now deceased   SIGNATURE    Dr. Kalman Shan, M.D., F.C.C.P,  Pulmonary and Critical Care Medicine Staff Physician, [facility] Director - Interstitial Lung Disease  Program  Pulmonary Fibrosis [facility] at [facility] [place], [place], [address]  Pager: [phone], If no answer or between  15:00h - 7:00h: call [phone] Telephone: [phone]  12:29 PM 02-07-20

## 2020-01-29 NOTE — Telephone Encounter (Signed)
Pt's status has been updated in chart.nothing further needed.

## 2020-02-04 DEATH — deceased
